# Patient Record
Sex: Female | Born: 1940 | Race: White | Hispanic: No | Marital: Married | Smoking: Former smoker
Health system: Southern US, Community
[De-identification: ages and names within clinical notes are randomized; demographics above are authoritative.]

## PROBLEM LIST (undated history)

## (undated) DIAGNOSIS — I2510 Atherosclerotic heart disease of native coronary artery without angina pectoris: Secondary | ICD-10-CM

## (undated) DIAGNOSIS — B009 Herpesviral infection, unspecified: Secondary | ICD-10-CM

## (undated) DIAGNOSIS — D126 Benign neoplasm of colon, unspecified: Secondary | ICD-10-CM

## (undated) DIAGNOSIS — D649 Anemia, unspecified: Secondary | ICD-10-CM

## (undated) DIAGNOSIS — C50919 Malignant neoplasm of unspecified site of unspecified female breast: Secondary | ICD-10-CM

## (undated) DIAGNOSIS — M4800 Spinal stenosis, site unspecified: Secondary | ICD-10-CM

## (undated) DIAGNOSIS — J189 Pneumonia, unspecified organism: Secondary | ICD-10-CM

## (undated) DIAGNOSIS — E1122 Type 2 diabetes mellitus with diabetic chronic kidney disease: Secondary | ICD-10-CM

## (undated) DIAGNOSIS — I10 Essential (primary) hypertension: Secondary | ICD-10-CM

## (undated) DIAGNOSIS — K219 Gastro-esophageal reflux disease without esophagitis: Secondary | ICD-10-CM

## (undated) DIAGNOSIS — E039 Hypothyroidism, unspecified: Secondary | ICD-10-CM

## (undated) DIAGNOSIS — M069 Rheumatoid arthritis, unspecified: Secondary | ICD-10-CM

## (undated) DIAGNOSIS — Z923 Personal history of irradiation: Secondary | ICD-10-CM

## (undated) DIAGNOSIS — M1990 Unspecified osteoarthritis, unspecified site: Secondary | ICD-10-CM

## (undated) DIAGNOSIS — I771 Stricture of artery: Secondary | ICD-10-CM

## (undated) DIAGNOSIS — T7840XA Allergy, unspecified, initial encounter: Secondary | ICD-10-CM

## (undated) DIAGNOSIS — F419 Anxiety disorder, unspecified: Secondary | ICD-10-CM

## (undated) DIAGNOSIS — F32A Depression, unspecified: Secondary | ICD-10-CM

## (undated) DIAGNOSIS — I739 Peripheral vascular disease, unspecified: Secondary | ICD-10-CM

## (undated) DIAGNOSIS — K759 Inflammatory liver disease, unspecified: Secondary | ICD-10-CM

## (undated) DIAGNOSIS — I350 Nonrheumatic aortic (valve) stenosis: Secondary | ICD-10-CM

## (undated) DIAGNOSIS — Z9221 Personal history of antineoplastic chemotherapy: Secondary | ICD-10-CM

## (undated) DIAGNOSIS — R531 Weakness: Secondary | ICD-10-CM

## (undated) DIAGNOSIS — N184 Chronic kidney disease, stage 4 (severe): Secondary | ICD-10-CM

## (undated) DIAGNOSIS — K5790 Diverticulosis of intestine, part unspecified, without perforation or abscess without bleeding: Secondary | ICD-10-CM

## (undated) DIAGNOSIS — E079 Disorder of thyroid, unspecified: Secondary | ICD-10-CM

## (undated) DIAGNOSIS — E785 Hyperlipidemia, unspecified: Secondary | ICD-10-CM

## (undated) DIAGNOSIS — E7800 Pure hypercholesterolemia, unspecified: Secondary | ICD-10-CM

## (undated) DIAGNOSIS — G479 Sleep disorder, unspecified: Secondary | ICD-10-CM

## (undated) DIAGNOSIS — R011 Cardiac murmur, unspecified: Secondary | ICD-10-CM

## (undated) DIAGNOSIS — C801 Malignant (primary) neoplasm, unspecified: Secondary | ICD-10-CM

## (undated) DIAGNOSIS — F329 Major depressive disorder, single episode, unspecified: Secondary | ICD-10-CM

## (undated) HISTORY — DX: Benign neoplasm of colon, unspecified: D12.6

## (undated) HISTORY — DX: Type 2 diabetes mellitus with diabetic chronic kidney disease: N18.4

## (undated) HISTORY — DX: Diverticulosis of intestine, part unspecified, without perforation or abscess without bleeding: K57.90

## (undated) HISTORY — DX: Gastro-esophageal reflux disease without esophagitis: K21.9

## (undated) HISTORY — PX: CHOLECYSTECTOMY: SHX55

## (undated) HISTORY — DX: Peripheral vascular disease, unspecified: I73.9

## (undated) HISTORY — DX: Malignant neoplasm of unspecified site of unspecified female breast: C50.919

## (undated) HISTORY — DX: Type 2 diabetes mellitus with diabetic chronic kidney disease: E11.22

## (undated) HISTORY — PX: CARPAL TUNNEL RELEASE: SHX101

## (undated) HISTORY — PX: OTHER SURGICAL HISTORY: SHX169

## (undated) HISTORY — PX: BREAST BIOPSY: SHX20

## (undated) HISTORY — DX: Disorder of thyroid, unspecified: E07.9

## (undated) HISTORY — PX: COLONOSCOPY: SHX174

## (undated) HISTORY — DX: Nonrheumatic aortic (valve) stenosis: I35.0

## (undated) HISTORY — PX: DILATION AND CURETTAGE OF UTERUS: SHX78

## (undated) HISTORY — DX: Unspecified osteoarthritis, unspecified site: M19.90

## (undated) HISTORY — DX: Hyperlipidemia, unspecified: E78.5

## (undated) HISTORY — DX: Essential (primary) hypertension: I10

## (undated) HISTORY — DX: Malignant (primary) neoplasm, unspecified: C80.1

## (undated) HISTORY — DX: Pure hypercholesterolemia, unspecified: E78.00

## (undated) HISTORY — DX: Herpesviral infection, unspecified: B00.9

## (undated) HISTORY — PX: UPPER GASTROINTESTINAL ENDOSCOPY: SHX188

## (undated) HISTORY — PX: CORONARY ANGIOPLASTY: SHX604

## (undated) HISTORY — DX: Stricture of artery: I77.1

## (undated) HISTORY — DX: Allergy, unspecified, initial encounter: T78.40XA

## (undated) HISTORY — DX: Anxiety disorder, unspecified: F41.9

---

## 1981-01-22 HISTORY — PX: BREAST LUMPECTOMY: SHX2

## 1997-04-19 ENCOUNTER — Other Ambulatory Visit: Admission: RE | Admit: 1997-04-19 | Discharge: 1997-04-19 | Payer: Self-pay | Admitting: Obstetrics and Gynecology

## 1998-07-14 ENCOUNTER — Encounter: Admission: RE | Admit: 1998-07-14 | Discharge: 1998-08-19 | Payer: Self-pay | Admitting: Family Medicine

## 1998-10-05 ENCOUNTER — Encounter: Admission: RE | Admit: 1998-10-05 | Discharge: 1999-01-03 | Payer: Self-pay | Admitting: Specialist

## 1998-11-28 ENCOUNTER — Encounter: Admission: RE | Admit: 1998-11-28 | Discharge: 1999-02-26 | Payer: Self-pay | Admitting: Anesthesiology

## 1999-04-25 ENCOUNTER — Encounter: Admission: RE | Admit: 1999-04-25 | Discharge: 1999-07-24 | Payer: Self-pay | Admitting: Family Medicine

## 1999-05-01 ENCOUNTER — Encounter: Payer: Self-pay | Admitting: Obstetrics and Gynecology

## 1999-05-01 ENCOUNTER — Encounter: Admission: RE | Admit: 1999-05-01 | Discharge: 1999-05-01 | Payer: Self-pay | Admitting: Obstetrics and Gynecology

## 1999-05-08 ENCOUNTER — Encounter: Payer: Self-pay | Admitting: Obstetrics and Gynecology

## 1999-05-08 ENCOUNTER — Encounter: Admission: RE | Admit: 1999-05-08 | Discharge: 1999-05-08 | Payer: Self-pay | Admitting: Obstetrics and Gynecology

## 1999-11-08 ENCOUNTER — Encounter: Payer: Self-pay | Admitting: Emergency Medicine

## 1999-11-08 ENCOUNTER — Encounter (INDEPENDENT_AMBULATORY_CARE_PROVIDER_SITE_OTHER): Payer: Self-pay | Admitting: *Deleted

## 1999-11-08 ENCOUNTER — Inpatient Hospital Stay (HOSPITAL_COMMUNITY): Admission: EM | Admit: 1999-11-08 | Discharge: 1999-11-10 | Payer: Self-pay | Admitting: Emergency Medicine

## 2000-05-13 ENCOUNTER — Encounter: Admission: RE | Admit: 2000-05-13 | Discharge: 2000-05-13 | Payer: Self-pay | Admitting: Obstetrics and Gynecology

## 2000-05-13 ENCOUNTER — Encounter: Payer: Self-pay | Admitting: Obstetrics and Gynecology

## 2000-07-10 ENCOUNTER — Encounter: Payer: Self-pay | Admitting: Family Medicine

## 2000-07-10 ENCOUNTER — Encounter: Admission: RE | Admit: 2000-07-10 | Discharge: 2000-07-10 | Payer: Self-pay | Admitting: Family Medicine

## 2001-05-16 ENCOUNTER — Encounter: Admission: RE | Admit: 2001-05-16 | Discharge: 2001-05-16 | Payer: Self-pay | Admitting: Obstetrics and Gynecology

## 2001-05-16 ENCOUNTER — Encounter: Payer: Self-pay | Admitting: Obstetrics and Gynecology

## 2002-05-28 ENCOUNTER — Encounter: Admission: RE | Admit: 2002-05-28 | Discharge: 2002-05-28 | Payer: Self-pay | Admitting: Family Medicine

## 2002-05-28 ENCOUNTER — Encounter: Payer: Self-pay | Admitting: Family Medicine

## 2002-05-29 ENCOUNTER — Encounter: Payer: Self-pay | Admitting: Obstetrics and Gynecology

## 2002-05-29 ENCOUNTER — Encounter: Admission: RE | Admit: 2002-05-29 | Discharge: 2002-05-29 | Payer: Self-pay | Admitting: Obstetrics and Gynecology

## 2003-06-04 ENCOUNTER — Ambulatory Visit (HOSPITAL_COMMUNITY): Admission: RE | Admit: 2003-06-04 | Discharge: 2003-06-04 | Payer: Self-pay | Admitting: Obstetrics and Gynecology

## 2003-06-18 ENCOUNTER — Encounter: Admission: RE | Admit: 2003-06-18 | Discharge: 2003-06-18 | Payer: Self-pay | Admitting: Obstetrics and Gynecology

## 2003-12-10 ENCOUNTER — Encounter (INDEPENDENT_AMBULATORY_CARE_PROVIDER_SITE_OTHER): Payer: Self-pay | Admitting: *Deleted

## 2003-12-10 ENCOUNTER — Ambulatory Visit (HOSPITAL_COMMUNITY): Admission: RE | Admit: 2003-12-10 | Discharge: 2003-12-10 | Payer: Self-pay | Admitting: Gastroenterology

## 2003-12-10 DIAGNOSIS — K5790 Diverticulosis of intestine, part unspecified, without perforation or abscess without bleeding: Secondary | ICD-10-CM

## 2003-12-10 DIAGNOSIS — D126 Benign neoplasm of colon, unspecified: Secondary | ICD-10-CM

## 2003-12-10 HISTORY — DX: Benign neoplasm of colon, unspecified: D12.6

## 2003-12-10 HISTORY — DX: Diverticulosis of intestine, part unspecified, without perforation or abscess without bleeding: K57.90

## 2004-06-30 ENCOUNTER — Ambulatory Visit (HOSPITAL_COMMUNITY): Admission: RE | Admit: 2004-06-30 | Discharge: 2004-06-30 | Payer: Self-pay | Admitting: Obstetrics and Gynecology

## 2004-12-25 ENCOUNTER — Ambulatory Visit: Payer: Self-pay | Admitting: Cardiovascular Disease

## 2005-01-03 ENCOUNTER — Ambulatory Visit (HOSPITAL_COMMUNITY): Admission: RE | Admit: 2005-01-03 | Discharge: 2005-01-03 | Payer: Self-pay | Admitting: Cardiovascular Disease

## 2005-01-08 ENCOUNTER — Ambulatory Visit: Payer: Self-pay | Admitting: Cardiovascular Disease

## 2005-01-22 HISTORY — PX: CAROTID STENT: SHX1301

## 2005-01-22 HISTORY — PX: KNEE SURGERY: SHX244

## 2005-01-25 ENCOUNTER — Ambulatory Visit: Payer: Self-pay | Admitting: Cardiovascular Disease

## 2005-01-26 ENCOUNTER — Ambulatory Visit: Payer: Self-pay | Admitting: Cardiovascular Disease

## 2005-01-26 ENCOUNTER — Ambulatory Visit (HOSPITAL_COMMUNITY): Admission: RE | Admit: 2005-01-26 | Discharge: 2005-01-27 | Payer: Self-pay | Admitting: Cardiovascular Disease

## 2005-02-02 ENCOUNTER — Ambulatory Visit: Payer: Self-pay | Admitting: Cardiovascular Disease

## 2005-04-06 ENCOUNTER — Ambulatory Visit: Payer: Self-pay | Admitting: Cardiovascular Disease

## 2005-04-06 ENCOUNTER — Ambulatory Visit: Payer: Self-pay

## 2005-07-06 ENCOUNTER — Ambulatory Visit (HOSPITAL_COMMUNITY): Admission: RE | Admit: 2005-07-06 | Discharge: 2005-07-06 | Payer: Self-pay | Admitting: Obstetrics and Gynecology

## 2005-09-28 ENCOUNTER — Ambulatory Visit: Payer: Self-pay | Admitting: Cardiovascular Disease

## 2006-03-28 ENCOUNTER — Ambulatory Visit: Payer: Self-pay | Admitting: Cardiovascular Disease

## 2006-03-28 LAB — CONVERTED CEMR LAB
BUN: 19 mg/dL (ref 6–23)
Basophils Absolute: 0.1 10*3/uL (ref 0.0–0.1)
Basophils Relative: 1 % (ref 0.0–1.0)
CO2: 26 meq/L (ref 19–32)
Calcium: 9.4 mg/dL (ref 8.4–10.5)
Creatinine, Ser: 1 mg/dL (ref 0.4–1.2)
GFR calc Af Amer: 72 mL/min
GFR calc non Af Amer: 60 mL/min
HCT: 37.8 % (ref 36.0–46.0)
Hemoglobin: 13.1 g/dL (ref 12.0–15.0)
INR: 0.9 (ref 0.9–2.0)
MCV: 91 fL (ref 78.0–100.0)
Neutrophils Relative %: 47.6 % (ref 43.0–77.0)
RDW: 12.3 % (ref 11.5–14.6)
aPTT: 27.3 s (ref 26.5–36.5)

## 2006-04-01 ENCOUNTER — Ambulatory Visit: Payer: Self-pay | Admitting: Cardiovascular Disease

## 2006-04-01 ENCOUNTER — Inpatient Hospital Stay (HOSPITAL_BASED_OUTPATIENT_CLINIC_OR_DEPARTMENT_OTHER): Admission: RE | Admit: 2006-04-01 | Discharge: 2006-04-01 | Payer: Self-pay | Admitting: Cardiovascular Disease

## 2006-04-08 ENCOUNTER — Emergency Department (HOSPITAL_COMMUNITY): Admission: EM | Admit: 2006-04-08 | Discharge: 2006-04-08 | Payer: Self-pay | Admitting: Emergency Medicine

## 2006-04-11 ENCOUNTER — Ambulatory Visit: Payer: Self-pay | Admitting: Cardiovascular Disease

## 2006-05-07 ENCOUNTER — Ambulatory Visit: Payer: Self-pay | Admitting: Internal Medicine

## 2006-05-16 ENCOUNTER — Ambulatory Visit: Payer: Self-pay | Admitting: Internal Medicine

## 2006-05-21 ENCOUNTER — Ambulatory Visit: Payer: Self-pay | Admitting: Internal Medicine

## 2006-05-21 LAB — CONVERTED CEMR LAB
OCCULT 1: NEGATIVE
OCCULT 3: NEGATIVE

## 2006-05-24 ENCOUNTER — Ambulatory Visit: Payer: Self-pay | Admitting: Internal Medicine

## 2006-05-24 ENCOUNTER — Encounter (INDEPENDENT_AMBULATORY_CARE_PROVIDER_SITE_OTHER): Payer: Self-pay | Admitting: Specialist

## 2006-06-04 ENCOUNTER — Encounter: Payer: Self-pay | Admitting: Internal Medicine

## 2006-07-02 ENCOUNTER — Telehealth: Payer: Self-pay | Admitting: Internal Medicine

## 2006-07-02 ENCOUNTER — Telehealth (INDEPENDENT_AMBULATORY_CARE_PROVIDER_SITE_OTHER): Payer: Self-pay | Admitting: *Deleted

## 2006-07-08 ENCOUNTER — Ambulatory Visit (HOSPITAL_COMMUNITY): Admission: RE | Admit: 2006-07-08 | Discharge: 2006-07-08 | Payer: Self-pay | Admitting: Obstetrics and Gynecology

## 2006-07-15 ENCOUNTER — Ambulatory Visit: Payer: Self-pay | Admitting: Cardiovascular Disease

## 2006-09-12 ENCOUNTER — Ambulatory Visit: Payer: Self-pay | Admitting: Internal Medicine

## 2006-09-14 LAB — CONVERTED CEMR LAB: Hgb A1c MFr Bld: 6.4 % — ABNORMAL HIGH (ref 4.6–6.0)

## 2006-09-16 ENCOUNTER — Encounter (INDEPENDENT_AMBULATORY_CARE_PROVIDER_SITE_OTHER): Payer: Self-pay | Admitting: *Deleted

## 2006-11-07 ENCOUNTER — Ambulatory Visit: Payer: Self-pay | Admitting: Internal Medicine

## 2006-11-13 ENCOUNTER — Telehealth (INDEPENDENT_AMBULATORY_CARE_PROVIDER_SITE_OTHER): Payer: Self-pay | Admitting: *Deleted

## 2006-11-17 LAB — CONVERTED CEMR LAB
ALT: 19 units/L (ref 0–35)
BUN: 13 mg/dL (ref 6–23)
Creatinine, Ser: 0.9 mg/dL (ref 0.4–1.2)
Creatinine,U: 97.2 mg/dL
Direct LDL: 171.8 mg/dL
HDL: 40.3 mg/dL (ref >39.0)
Hgb A1c MFr Bld: 6 % (ref 4.6–6.0)
Microalb, Ur: 2.2 mg/dL — ABNORMAL HIGH (ref 0.0–1.9)
Total CHOL/HDL Ratio: 5.9

## 2006-11-19 ENCOUNTER — Encounter (INDEPENDENT_AMBULATORY_CARE_PROVIDER_SITE_OTHER): Payer: Self-pay | Admitting: *Deleted

## 2006-12-03 ENCOUNTER — Ambulatory Visit: Payer: Self-pay | Admitting: Internal Medicine

## 2006-12-03 DIAGNOSIS — R7989 Other specified abnormal findings of blood chemistry: Secondary | ICD-10-CM | POA: Insufficient documentation

## 2006-12-23 ENCOUNTER — Ambulatory Visit: Payer: Self-pay | Admitting: Cardiovascular Disease

## 2007-01-06 ENCOUNTER — Ambulatory Visit: Payer: Self-pay | Admitting: Internal Medicine

## 2007-01-21 ENCOUNTER — Ambulatory Visit: Payer: Self-pay | Admitting: Internal Medicine

## 2007-01-27 ENCOUNTER — Telehealth: Payer: Self-pay | Admitting: Internal Medicine

## 2007-02-05 ENCOUNTER — Encounter: Payer: Self-pay | Admitting: Internal Medicine

## 2007-04-01 ENCOUNTER — Ambulatory Visit: Payer: Self-pay | Admitting: Cardiovascular Disease

## 2007-04-03 ENCOUNTER — Ambulatory Visit: Payer: Self-pay | Admitting: Internal Medicine

## 2007-04-03 ENCOUNTER — Ambulatory Visit: Payer: Self-pay

## 2007-04-03 DIAGNOSIS — E039 Hypothyroidism, unspecified: Secondary | ICD-10-CM | POA: Insufficient documentation

## 2007-04-08 ENCOUNTER — Encounter: Payer: Self-pay | Admitting: Internal Medicine

## 2007-04-14 ENCOUNTER — Encounter (INDEPENDENT_AMBULATORY_CARE_PROVIDER_SITE_OTHER): Payer: Self-pay | Admitting: *Deleted

## 2007-04-17 ENCOUNTER — Ambulatory Visit: Payer: Self-pay | Admitting: Internal Medicine

## 2007-04-17 ENCOUNTER — Encounter (INDEPENDENT_AMBULATORY_CARE_PROVIDER_SITE_OTHER): Payer: Self-pay | Admitting: *Deleted

## 2007-04-17 DIAGNOSIS — I2510 Atherosclerotic heart disease of native coronary artery without angina pectoris: Secondary | ICD-10-CM | POA: Insufficient documentation

## 2007-04-17 DIAGNOSIS — M2550 Pain in unspecified joint: Secondary | ICD-10-CM | POA: Insufficient documentation

## 2007-04-17 DIAGNOSIS — I10 Essential (primary) hypertension: Secondary | ICD-10-CM | POA: Insufficient documentation

## 2007-04-17 DIAGNOSIS — Z9861 Coronary angioplasty status: Secondary | ICD-10-CM

## 2007-04-17 LAB — CONVERTED CEMR LAB
HDL goal, serum: 40 mg/dL
LDL Goal: 100 mg/dL

## 2007-04-20 LAB — CONVERTED CEMR LAB: Sed Rate: 18 mm/hr (ref 0–25)

## 2007-04-21 ENCOUNTER — Encounter (INDEPENDENT_AMBULATORY_CARE_PROVIDER_SITE_OTHER): Payer: Self-pay | Admitting: *Deleted

## 2007-04-29 ENCOUNTER — Telehealth (INDEPENDENT_AMBULATORY_CARE_PROVIDER_SITE_OTHER): Payer: Self-pay | Admitting: *Deleted

## 2007-05-27 ENCOUNTER — Encounter: Admission: RE | Admit: 2007-05-27 | Discharge: 2007-07-22 | Payer: Self-pay | Admitting: Orthopedic Surgery

## 2007-06-02 ENCOUNTER — Ambulatory Visit: Payer: Self-pay | Admitting: Internal Medicine

## 2007-06-11 ENCOUNTER — Telehealth (INDEPENDENT_AMBULATORY_CARE_PROVIDER_SITE_OTHER): Payer: Self-pay | Admitting: *Deleted

## 2007-07-14 ENCOUNTER — Ambulatory Visit: Payer: Self-pay | Admitting: Internal Medicine

## 2007-07-14 LAB — CONVERTED CEMR LAB
AST: 19 units/L (ref 0–37)
Albumin: 3.8 g/dL (ref 3.5–5.2)
BUN: 16 mg/dL (ref 6–23)
Hgb A1c MFr Bld: 5.8 % (ref 4.6–6.0)
Potassium: 4.1 meq/L (ref 3.5–5.1)
Total CHOL/HDL Ratio: 4.1
Total CK: 76 units/L (ref 7–177)
Total Protein: 6.6 g/dL (ref 6.0–8.3)
Triglycerides: 113 mg/dL (ref 0–149)

## 2007-07-21 ENCOUNTER — Ambulatory Visit: Payer: Self-pay | Admitting: Internal Medicine

## 2007-07-21 LAB — CONVERTED CEMR LAB: LDL Goal: 70 mg/dL

## 2007-07-29 ENCOUNTER — Ambulatory Visit (HOSPITAL_COMMUNITY): Admission: RE | Admit: 2007-07-29 | Discharge: 2007-07-29 | Payer: Self-pay | Admitting: Obstetrics and Gynecology

## 2007-09-16 ENCOUNTER — Ambulatory Visit: Payer: Self-pay

## 2007-09-17 ENCOUNTER — Telehealth (INDEPENDENT_AMBULATORY_CARE_PROVIDER_SITE_OTHER): Payer: Self-pay | Admitting: *Deleted

## 2007-10-21 ENCOUNTER — Ambulatory Visit: Payer: Self-pay | Admitting: Internal Medicine

## 2007-11-19 ENCOUNTER — Ambulatory Visit: Payer: Self-pay | Admitting: Internal Medicine

## 2007-11-25 ENCOUNTER — Encounter: Payer: Self-pay | Admitting: Internal Medicine

## 2008-01-27 ENCOUNTER — Ambulatory Visit: Payer: Self-pay | Admitting: Internal Medicine

## 2008-01-27 LAB — CONVERTED CEMR LAB
ALT: 23 units/L (ref 0–35)
AST: 24 units/L (ref 0–37)
Alkaline Phosphatase: 66 units/L (ref 39–117)
BUN: 19 mg/dL (ref 6–23)
Bilirubin, Direct: 0.1 mg/dL (ref 0.0–0.3)
Creatinine, Ser: 1.1 mg/dL (ref 0.4–1.2)
Direct LDL: 131.8 mg/dL
Microalb Creat Ratio: 7.2 mg/g (ref 0.0–30.0)
Microalb, Ur: 0.9 mg/dL (ref 0.0–1.9)
Total Bilirubin: 0.6 mg/dL (ref 0.3–1.2)
Triglycerides: 202 mg/dL (ref 0–149)

## 2008-02-02 ENCOUNTER — Ambulatory Visit: Payer: Self-pay | Admitting: Internal Medicine

## 2008-02-23 ENCOUNTER — Telehealth (INDEPENDENT_AMBULATORY_CARE_PROVIDER_SITE_OTHER): Payer: Self-pay | Admitting: *Deleted

## 2008-02-24 ENCOUNTER — Encounter (INDEPENDENT_AMBULATORY_CARE_PROVIDER_SITE_OTHER): Payer: Self-pay | Admitting: *Deleted

## 2008-03-11 ENCOUNTER — Telehealth: Payer: Self-pay | Admitting: Internal Medicine

## 2008-03-29 ENCOUNTER — Encounter: Payer: Self-pay | Admitting: Internal Medicine

## 2008-05-13 ENCOUNTER — Ambulatory Visit: Payer: Self-pay | Admitting: Internal Medicine

## 2008-05-18 ENCOUNTER — Encounter (INDEPENDENT_AMBULATORY_CARE_PROVIDER_SITE_OTHER): Payer: Self-pay | Admitting: *Deleted

## 2008-05-18 LAB — CONVERTED CEMR LAB
BUN: 18 mg/dL (ref 6–23)
Cholesterol: 158 mg/dL (ref 0–200)
Creatinine,U: 112.9 mg/dL
LDL Cholesterol: 83 mg/dL (ref 0–99)
Microalb Creat Ratio: 8.9 mg/g (ref 0.0–30.0)
Total CHOL/HDL Ratio: 4

## 2008-05-20 ENCOUNTER — Ambulatory Visit: Payer: Self-pay | Admitting: Internal Medicine

## 2008-05-20 DIAGNOSIS — E1129 Type 2 diabetes mellitus with other diabetic kidney complication: Secondary | ICD-10-CM | POA: Insufficient documentation

## 2008-05-20 DIAGNOSIS — Z794 Long term (current) use of insulin: Secondary | ICD-10-CM | POA: Insufficient documentation

## 2008-05-20 DIAGNOSIS — E1165 Type 2 diabetes mellitus with hyperglycemia: Secondary | ICD-10-CM

## 2008-05-20 DIAGNOSIS — E119 Type 2 diabetes mellitus without complications: Secondary | ICD-10-CM | POA: Insufficient documentation

## 2008-05-31 ENCOUNTER — Ambulatory Visit: Payer: Self-pay | Admitting: Family Medicine

## 2008-05-31 DIAGNOSIS — H6500 Acute serous otitis media, unspecified ear: Secondary | ICD-10-CM | POA: Insufficient documentation

## 2008-06-08 ENCOUNTER — Encounter: Payer: Self-pay | Admitting: Internal Medicine

## 2008-09-14 ENCOUNTER — Ambulatory Visit: Payer: Self-pay | Admitting: Cardiovascular Disease

## 2008-09-14 DIAGNOSIS — F32A Depression, unspecified: Secondary | ICD-10-CM | POA: Insufficient documentation

## 2008-09-14 DIAGNOSIS — K219 Gastro-esophageal reflux disease without esophagitis: Secondary | ICD-10-CM | POA: Insufficient documentation

## 2008-09-14 DIAGNOSIS — E7800 Pure hypercholesterolemia, unspecified: Secondary | ICD-10-CM | POA: Insufficient documentation

## 2008-09-14 DIAGNOSIS — F329 Major depressive disorder, single episode, unspecified: Secondary | ICD-10-CM | POA: Insufficient documentation

## 2008-09-20 ENCOUNTER — Telehealth (INDEPENDENT_AMBULATORY_CARE_PROVIDER_SITE_OTHER): Payer: Self-pay

## 2008-09-21 ENCOUNTER — Encounter: Payer: Self-pay | Admitting: Cardiovascular Disease

## 2008-09-21 ENCOUNTER — Ambulatory Visit: Payer: Self-pay

## 2008-09-29 ENCOUNTER — Encounter: Payer: Self-pay | Admitting: Internal Medicine

## 2008-11-25 ENCOUNTER — Telehealth (INDEPENDENT_AMBULATORY_CARE_PROVIDER_SITE_OTHER): Payer: Self-pay | Admitting: *Deleted

## 2008-12-08 ENCOUNTER — Encounter: Payer: Self-pay | Admitting: Internal Medicine

## 2009-01-03 ENCOUNTER — Telehealth (INDEPENDENT_AMBULATORY_CARE_PROVIDER_SITE_OTHER): Payer: Self-pay | Admitting: *Deleted

## 2009-05-18 ENCOUNTER — Telehealth (INDEPENDENT_AMBULATORY_CARE_PROVIDER_SITE_OTHER): Payer: Self-pay | Admitting: *Deleted

## 2010-02-12 ENCOUNTER — Encounter: Payer: Self-pay | Admitting: Gastroenterology

## 2010-02-19 LAB — CONVERTED CEMR LAB
Total CK: 73 units/L (ref 7–177)
Total Protein: 7.1 g/dL (ref 6.0–8.3)

## 2010-02-23 NOTE — Progress Notes (Signed)
Summary: Refill Request to be forwarded  Phone Note Outgoing Call Call back at [facility] Phone [phone]   Call placed by: Georgette Dover,  May 18, 2009 11:45 AM Call placed to: Patient Summary of Call: Left message on machine informing patient that I will forward her refill request for generic Amaryl to her Endo/Diabetes Dr., Dr.Kumar. If patient has any question reguarding this she can call our office./[facility]  May 18, 2009 11:48 AM

## 2010-05-17 ENCOUNTER — Telehealth: Payer: Self-pay | Admitting: Cardiovascular Disease

## 2010-05-17 NOTE — Telephone Encounter (Signed)
Pt calling re taking some med given to her for a sprained hip-what anti inflammatory med can she take?

## 2010-05-17 NOTE — Telephone Encounter (Signed)
Spoke with pt, she was seen by an ortho, she has a strained hip. He wanted to put the pt on a antiinflammatory but was concerned because of her stent. He wanted to know if dr Johnsie Cancel thought it was okay for the pt to take aleve for several days in a row. Will forward for dr Johnsie Cancel review [name]

## 2010-05-18 NOTE — Telephone Encounter (Signed)
Ok to use antiinflamatory just not Cox 2 inhibitor like Celebrex

## 2010-05-19 NOTE — Telephone Encounter (Signed)
Spoke with pt, she is aware of dr Kyla Balzarine recommendations. Fredia Beets

## 2010-06-06 NOTE — Assessment & Plan Note (Signed)
[place] OFFICE NOTE   NAME:Bachtell, RONDELL ZERVAS                      MRN:          [id number]  DATE:07/15/2006                            DOB:          Mar 02, 1940    Kirston returns today for followup. She has had a previous Taxus stent in  January of 2007 to the distal right. She had been having recurrent chest  pain. She was catheterized in March of 2008. She got sick during the  catheterization. I was not sure why. She had chest pain, complete heart  block. She may have had spasm. Her stent was widely patent. We thought  that her pain may be from esophageal reflux. She was placed on Protonix.   Overall, Timeca is doing well. She is not having any recurrent chest  pain. She was a bit depressed this morning. Her mother died recently.  She was 88 years old and was tired, and Koreen said it was time.   She has been compliant with her medications. She has been walking on a  regular basis.   Her review of systems is remarkable for some lower extremity edema.  Otherwise negative.   Her medications include:  1. Lisinopril 40 a day.  2. Paxil 20 a day.  3. Niaspan 1 gram a day.  4. Naprosyn p.r.n.  5. Actos 30 a day.  6. Welchol.  7. Protonix 40 a day.   Her exam is remarkable for a healthy-appearing, middle-aged, white  female in no distress. Mood is somewhat flat due to her mother's recent  death. Weight is 163, blood pressure is 150/80, pulse is 81 and regular.  HEENT:  Is normal. Carotids normal without bruit. There is no  lymphadenopathy. No thyromegaly. No JVP elevation.  LUNGS:  Are clear with normal diaphragmatic motion and no wheezing.  Respiratory rate is 12. She is afebrile.  S1/S2 with normal heart sounds. PMI is normal.  ABDOMEN:  Benign. There is no tenderness. Bowel sounds are positive. No  hepatosplenomegaly or hepatojugular reflux. No AAA.  Femorals are +3 bilaterally without bruit. PTs were +3.  There was no  lower extremity edema.  NEUROLOGICAL:  Nonfocal. There is no muscular weakness.   IMPRESSION:  1. Stable coronary disease. Patent stent to the right coronary artery.      Continue aspirin and Plavix.  2. Blood pressure borderline elevated. Add back beta blocker of Coreg      3.125 b.i.d. Continue low-salt diet and lisinopril.  3. Lower extremity edema. P.r.n. hydrochlorothiazide given. The      patient will asked Dr. Elyse Hsu about going back on glipizide as      opposed to Actos in regards to her heart and fluid retention.  4. Hypercholesterolemia. I will have to review the chart. Apparently,      intolerant to statins. Continue Niaspan and Welchol. Followup lipid      and liver profile in 6 months.  5. History of reflux. Continue H2 blocker. Follow up with primary M.D.      to see if endoscopy is needed.  6. Recent death  of mother, history of depression. Continue Paxil 20 a      day.   I will see her back in 6 months.     Wallis Bamberg. Johnsie Cancel, MD, [facility]  Electronically Signed    PCN/MedQ  DD: 07/15/2006  DT: 07/15/2006  Job #: [id number]

## 2010-06-06 NOTE — Assessment & Plan Note (Signed)
[facility] HEALTHCARE                            CARDIOLOGY OFFICE NOTE   NAME:[name], [name]                      MRN:          [id number]  DATE:12/23/2006                            DOB:          Jun 22, 1940    [name] returns today for followup.   She has had previous angioplasty and stenting in the right coronary  artery I believe in 2004.  Last Myoview in March 2007 was nonischemic  and when I last saw her blood pressure had been up a little bit, and we  added Coreg.  She has done well with this.  I was also concerned about  Actos causing lower extremity edema.  Since I last saw her she switched  doctors and sees Dr. Linna Darner now.  He has stopped all of her medications  and is going to do some lab work in a month or so.  I am not sure if he  plans on doing a glucose tolerance test.  However, off the Actos her  lower extremity edema is better.   She has been trying to watch her salt intake.  She is doing better in  regards to her mother passing last year.  She seems to have gotten over  this and has been maintained on 10 of Paxil.   She did have a good Thanksgiving with her family and seems to be in  brighter spirits.   REVIEW OF SYSTEMS:  Otherwise, negative.   CURRENT MEDICATIONS:  1. Lisinopril 40 a day.  2. Paxil 20 a day.  3. Plavix 75 a day.  4. Protonix 40 a day.  5. HCTZ 12.5 p.r.n.  6. Coreg 125 b.i.d.  She uses the [facility] on [address]      in [place].   PHYSICAL EXAMINATION:  GENERAL:  Remarkable for healthy-appearing middle-  aged white female in no distress.  Affect is appropriate and improved.  VITAL SIGNS:  Weight 161, blood pressure 140/80, pulse 77 and regular,  afebrile, respiratory rate 14.  HEENT:  Unremarkable.  Carotids normal without bruit.  NECK:  No lymphadenopathy, thyromegaly or JVP elevation.  LUNGS:  Clear, good diaphragmatic motion.  No wheezing.  HEART:  S1, S2 with normal heart sounds.  PMI is  normal.  ABDOMEN:  Benign.  Bowel sounds are positive with no tenderness, no  hepatosplenomegaly, no hepatojugular reflux.  EXTREMITIES:  Distal pulses are intact with no edema.  NEUROLOGICAL:  Nonfocal, no muscular weakness.  SKIN:  Warm and dry.   Based on EKG is essentially normal with J point elevation in the  inferior leads and then significant Q's in II, III, and aVF.   IMPRESSION:  1. History of right coronary stent in 2004.  Currently not having      angina.  Consider followup Myoview in March followup.  Continue      aspirin and beta blocker therapy.  2. Hypertension currently better controlled with the addition of beta      blocker.  Continue lisinopril, low salt diet.  3. Lower extremity edema probably secondary to dependency and Actos,  improved.  She has p.r.n. hydrochlorothiazide to take if she needs      it.  4. Question of diabetes.  Follow up with Dr. Linna Darner.  Probably check      hemoglobin A1c and glucose tolerance test.  5. Depression, improved.  Continue Paxil 20 mg a day reactive.  Seems      to be getting along much better now that her mom has passed away      more than a year ago.  Family support seemed excellent.  6. History of reflux.  Continue Protonix 40 a day.  Low spice diet.      Avoid late night meals.   Overall [name] is doing good, and I suspect we will order a Myoview in  March.     Wallis Bamberg. Johnsie Cancel, MD, [facility]  Electronically Signed    PCN/MedQ  DD: 12/23/2006  DT: 12/23/2006  Job #: [id number]

## 2010-06-06 NOTE — Assessment & Plan Note (Signed)
[place] OFFICE NOTE   NAME:[name] [name] WOODROME                      MRN:          [id number]  DATE:09/16/2007                            DOB:          09/12/1940    [name] [name] returns today for followup.  She has a history of previous stent  to the distal right coronary artery.   She recently had a Myoview in March 2009, to clear her for left  orthopedic rotator cuff surgery in her shoulder.  This was nonischemic.  She has been doing well.  She has not had any recurrent chest pain.   Her stent was widely patent by cath in March 2008.   She did appear to have either spasm or dye reaction during her last  cath, but I did not think this was clinically relevant.   She has been compliant with her medications, she sees Dr. Linna Darner.  I do  not have her most recent cholesterol level.   REVIEW OF SYSTEMS:  Remarkable for what sounds like some carpal tunnel  type syndrome, particularly pneumonia when she wakes up.  I suspect she  is sleeping with bent wrists, otherwise, negative.   MEDICATIONS:  1. She is on lisinopril 40.  2. Paxil 20.  3. Protonix 40.  4. Plavix 75 a day.  5. Synthroid 50 mcg a day.  6. Aspirin a day.  7. Crestor 5 a day.  8. Metformin 500 b.i.d.  9. Coreg 12.5 b.i.d.  10.Glimepiride 1 mg a day.  11.She has p.r.n. hydrochlorothiazide.   PHYSICAL EXAMINATION:  VITAL SIGNS:  Remarkable for blood pressure  150/70, pulse 70 regular, respiratory 14, and afebrile.  Weight 160.  HEENT:  Unremarkable.  NECK:  Carotids normal without bruit.  No lymphadenopathy, thyromegaly,  or JVP elevation.  LUNGS:  Clear with good diaphragmatic motion.  No wheezing.  S1 and S2  with a short systolic murmur.  PMI normal.  ABDOMEN:  Benign.  Bowel  sounds positive.  No AAA, no tenderness, no bruit, no  hepatosplenomegaly, and hepatojugular reflexes.  EXTREMITIES:  Distal pulses are intact.  No edema.  NEUROLOGIC:   Nonfocal.  SKIN:  Warm and dry.  No muscular weakness.   EKG is totally normal.   IMPRESSION:  1. Coronary artery disease, stent to the distal right, continue      aspirin and Plavix.  Nonischemic Myoview in March.  Follow up in a      year.  2. Hypertension, currently well controlled.  Continue current dose of      ACE inhibitor and low-salt diet.  3. Hypercholesterolemia.  Follow up with Dr. Linna Darner.  Continue Crestor      5 mg a day.  Lipid and liver profile in 6 months.  4. Hypothyroidism.  Check TSH per Dr. Linna Darner.  Continue low-dose      Synthroid.  5. Diabetes.  Hemoglobin A1c quarterly seems to be following a      reasonable low-carbohydrate, low-sugar diet.  Continue oral      hypoglycemics.   Overall I think [name] [name] is doing well.  I will see her back in a year.     Wallis Bamberg. Johnsie Cancel, MD, [facility]  Electronically Signed    PCN/MedQ  DD: 09/16/2007  DT: 09/17/2007  Job #: [id number]

## 2010-06-06 NOTE — Assessment & Plan Note (Signed)
[place] OFFICE NOTE   NAME:Wimmer, CHENELL DULONG                      MRN:          [id number]  DATE:04/01/2007                            DOB:          01/23/1940    [name] returns today for followup.  She has known coronary artery  disease. Last cath was in March 2008. She had some spasm in her LAD with  injections. The patient had a stent to the distal right coronary artery  which was widely patent.  She has good LV function.   She needs to have left shoulder surgery with Metta Clines. Supple, M.D. for  bone spurs. She had a cortisone shot in it about 2 weeks ago with some  relief, but it is recurred.  I told her she should probably have a  Myoview study given the significant chest pain she had with her heart  cath.  There is a question of spasm.   The patient has not been on beta blockers both for this reason and the  fact that she seemed to have some side effects with Coreg.  She was on  Coreg previously and had quite low blood pressures at home with  symptomatic pain in her chest and dizziness.   She has also been intolerant to just about every statin that she has  been tried on. She has severe myalgias.  She had hot flashes and burning  in her arms with Niaspan. Her LDL cholesterol has been 170.  She is to  see Dr. Linna Darner to have her TSH and cholesterol work done again in the  next week or so. It may be that the only drug that she can tolerate is  fibrates.   Otherwise, she is doing well.  She is not having angina.  She is active.  She is not smoking.   In regards to her preop risk, it is otherwise low. Her review of systems  is otherwise negative.   CURRENT MEDICATIONS:  1. Lisinopril 40 a day.  2. Paxil 20 a day.  3. Protonix 40 a day.  4. Plavix 75 a day.  5. Synthroid 50 mcg a day.  6. Aspirin a day.   PHYSICAL EXAMINATION:  Is remarkable for a middle-aged white female in  no distress.  Her weight  is 160, blood pressure is 150/80, pulse 80 and  regular, afebrile, respiratory rate 14.  HEENT:  Unremarkable.  Carotids are normal without bruit. No  lymphadenopathy, thyromegaly or JVP elevation.  LUNGS:  Clear with good diaphragmatic motion.  No wheezing.  S1-S2 with normal heart sounds. PMI normal.  ABDOMEN:  Benign.  Bowel sounds positive.  No AAA. No  hepatosplenomegaly. No hepatojugular reflux. No bruits or tenderness.  Distal pulses are intact. No edema.  NEURO: Nonfocal.  SKIN:  Warm and dry.  No muscular weakness.   Baseline EKG is normal.   IMPRESSION:  1. Pre-op clearance. She will have a stress Myoview.  So long as this      is nonischemic she will be cleared for surgery.  2. Hypertension.  She has  a white coat component.  She clearly had low      blood pressures while at home. She takes her blood pressure home at      least twice a week and they have been in good range.  Continue      lisinopril at 40 mg a day for the time being. No beta blocker given      her problems with Coreg.  3. Current upper respiratory infection.  She does have a little bit of      exploratory wheezing on exam. She has had a upper respiratory      infection without fever.  She will follow with Dr. Linna Darner. No      indication for a Z-Pak at this time.  4. Hypothyroidism.  Continue Synthroid 50 mcg a day.  TSH and T4 to be      checked with cholesterol.  5. Hypercholesterolemia. INTOLERANT TO STATINS AND NIASPAN.  Recheck      cholesterol. Continue low-fat diet.  Consider adding fibrates.  6. History of depression.  Continue Paxil 20 a day.  Mood seems fine.  7. Left shoulder bone spur. Follow-up with Dr. Onnie Graham.  Probable      arthroscopic surgery as an outpatient once cleared by stress test.     Wallis Bamberg. Johnsie Cancel, MD, [facility]  Electronically Signed    PCN/MedQ  DD: 04/01/2007  DT: 04/01/2007  Job #: [id number]

## 2010-06-09 NOTE — Assessment & Plan Note (Signed)
[facility]                              CARDIOLOGY OFFICE NOTE   NAME:[name], [name] HIBBERT                      MRN:          [id number]  DATE:09/28/2005                            DOB:          01/18/1941    [name] returns today for followup.  She is status post Taxus stenting of the  distal right coronary artery.  I have told her she would be on Plavix  indefinitely.   She is a low grade diabetic.  I think her last hemoglobin A1C was 5.9.  She  is on Avandia, and I asked her to talk to Dr. Thea Silversmith about switching her  over to Amaryl.  From a cardiac standpoint, she is doing well.  She denies  significant chest pain.  She had some arthroscopic knee surgery that was  uneventful.   PHYSICAL EXAMINATION:  GENERAL:  She looks well.  VITAL SIGNS:  Blood pressure 120/70, pulse 70 and regular.  LUNGS:  Clear.  NECK:  Carotids are normal.  HEART:  There is an S1 and S2, with a soft systolic murmur.  ABDOMEN:  Benign.  EXTREMITIES:  Lower extremities with intact pulses.  No edema.   IMPRESSION:  Stable, status post Taxus stenting of this right coronary  artery.  No angina.   Continue current medical therapy.  She has nitroglycerin that is less than a  year old.  She will talk to Dr. Thea Silversmith about changing her oral  hypoglycemic.  I will see her back in six months.                               Wallis Bamberg. Johnsie Cancel, MD, [facility]    PCN/MedQ  DD:  09/28/2005  DT:  09/28/2005  Job #:  [id number]

## 2010-06-09 NOTE — Assessment & Plan Note (Signed)
[place] OFFICE NOTE   NAME:[name], [name]                      MRN:          [id number]  DATE:04/11/2006                            DOB:          [birth year]    [name] returns today for followup.  She has had a previous TAXUS stent in  January of 2007 to the distal right.  She had recurring chest pains and  a catheterization earlier this week.  She had a tough time with her  heart cath, having either coronary spasm with ST elevation chest pain as  well as short episode of heart block requiring temporary pacemaker.  However, her stent was widely patent.  She had no left-sided disease.  Her filling pressures were normal.  There was no pulmonary hypertension.  During the case, we gave her some prednisone.  She had difficulty  sleeping 2 days after the cath and her blood sugars were a little high.  She also had a bout of gastroenteritis and diarrhea, and has been having  low blood pressures.  She continues to get central chest pain.  It also  hurts when she swallows.  I suspect that part of her problem is  esophageal in nature.  I will try to get her in to see Dr. Ardis Hughs or Dr.  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   REVIEW OF SYSTEMS:  Otherwise, negative.  She has not had any fevers.  Her diarrhea is improved.   1. She has currently been holding her Coreg due to her low blood      pressure from her diarrhea.  2. She is on lisinopril 40 mg a day.  3. Paxil 20 a day.  4. An aspirin a day.  5. Plavix 75 a day.  6. Glipizide 5 a day.   EXAM:  Blood pressure is 128/70, pulse is 65 and regular.  HEENT:  Normal.  Carotids normal without bruit.  LUNGS:  Clear.  There is an S1, S2 with normal heart sounds.  ABDOMEN:  Benign.  Cath site is well-healed.  Distal pulses are intact.  No edema.   IMPRESSION:  Coronary artery disease, chest pain likely related to  esophageal spasm with reflux.  Continue Protonix.  Followup with  gastroenterology department for possible EGD or monometry.  We will see  her back in about 3 months.  I prefer to keep her on her Plavix even  though her stent was about 14 months ago.   She will get back on her Coreg as her blood pressure rises after her  diarrheal illness.   She will continue her Zetia for hyperlipidemia.     Wallis Bamberg. Johnsie Cancel, MD, [facility]  Electronically Signed    PCN/MedQ  DD: 04/11/2006  DT: 04/11/2006  Job #: [id number]

## 2010-06-09 NOTE — Cardiovascular Report (Signed)
[name], [name]               ACCOUNT NO.:  [id number]   MEDICAL RECORD NO.:  [id number]          PATIENT TYPE:  OIB   LOCATION:  1962                         FACILITY:  [facility]   PHYSICIAN:  Peter C. Johnsie Cancel, MD, FACCDATE OF BIRTH:  06-10-1940   DATE OF PROCEDURE:  04/01/2006  DATE OF DISCHARGE:                            CARDIAC CATHETERIZATION   [name] is a 65-year patient with recurrent chest pain.  She has had  previous stenting of the distal right coronary artery.  She has also  been having increasing shortness of breath.  A right and left heart cath  was done to assess coronary anatomy and pulmonary pressures.   Cine catheterization was done with a 4-French right femoral artery  sheath and a 7-French venous sheath.   After the initial 3 or 4 injections of the left coronary system, the  patient had significant chest pain with ST elevation on her monitor.  She was treated with normal saline fluid, 0.25 mg of atropine, and a  temporary pacemaker was inserted.  The entire episode lasted about 10  minutes.   She seemed to recover.  As her ST elevation resolved, we gave the  patient IC nitroglycerin.  Repeat injections of the left coronary system  showed no significant changes.  We were not sure if the patient had a  mild dye reaction verses coronary spasm with injection.   The left main coronary artery was normal.   The left anterior descending artery had 20-30% multiple discrete lesions  in the proximal mid vessel.  The patient had a medium-sized first  diagonal branch which was normal.  The second diagonal branch was  actually the apex vessel that was quite a large vessel.  The distal LAD  was small and diffusely diseased.  It may have been the culprit in  regards to possible spasm with injections.   There were no fixed stenoses in the distal LAD.   The circumflex coronary artery was small.  There was a 30% tubular  lesion in the ostium.  There was a single AV  groove and a single OM  branch which were normal.   The right coronary artery was widely patent.  There was a stent in the  distal vessel which was normal with no restenosis.   Ventriculography was not performed due to the concern about the  patient's chest pain starting with dye injection.   The patient's right heart cath showed the following:  Mean right atrial  pressure was 5, RV pressure was 30/7, PA pressure was 30/18, mean  pulmonary capillary wedge pressure was 12, LV pressure was 100/8, aortic  pressure was 100/60.  Cardiac output was 2.6 liters per minute.   When the patient became bradycardic and hypotensive after the initial  left-sided injections, a temporary pacemaker was placed through the  venous sheath.  This was done with fluoroscopic guidance with good  capture.  The patient was temporarily paced for approximately 3-4  minutes.   At the end of the case, the patient was pain free and stable with good  hemodynamics.  The heart rate  was 83 in sinus rhythm, and the blood  pressure is 122/77.   IMPRESSION:  I am not sure what to make of the patient's clinical  syndrome.  She may be having spasm.   We will continue to treat her medically.  The stent in the distal right  coronary artery is widely patent, and there is no fixed disease in the  left system.  In regards to her shortness of breath, there is no  significant pulmonary hypertension and her wedge pressure is low.   Despite the patient's possible spasm or dye reaction, she was stable at  the end of the case and will most likely be discharged later today.      Wallis Bamberg. Johnsie Cancel, MD, [facility]  Electronically Signed     PCN/MEDQ  D:  04/01/2006  T:  04/01/2006  Job:  [id number]

## 2010-06-09 NOTE — Assessment & Plan Note (Signed)
[facility] OFFICE NOTE   NAME:Lentz, CLEO STEG                      MRN:          [id number]  DATE:05/16/2006                            DOB:          09/03/1940    Ms. Janota is a very nice 70 year old white female who is here today  for evaluation of substernal discomfort which after cardiology  evaluation is attributed either to esophageal or gastric problems.  Ms.  Schwickerath has coronary artery disease and underwent cardiac  catheterization and placement of a stent by Dr. Johnsie Cancel within the last  year.  She is on Plavix and is doing well.  She had repeat  catheterization after presenting with recurrent chest pain within the  last month, with the conclusion that the stent was open.  She woke up  about four weeks ago with indigestion which became worse during the day,  and she ended up at [facility] emergency room on April 08, 2006 with  substernal chest pain which was evaluated, but she was sent home on  Protonix 40 mg a day.  The chest pain went away.  The patient  discontinued Protonix, and there was a recurrence of the pain last week,  with burning substernal in the midline.  She was started again on  Protonix 40 mg, this time twice a day, with improvement of the heartburn  but she still has an uncomfortable feeling, almost like odynophagia when  she eats and also some dysphagia.  She was at the time on naproxen 500  mg on a p.r.n. basis and aspirin 81 mg a day.  She has been seeing Dr.  Elyse Hsu for hyperlipidemia, increased TSH, and diabetes.   MEDICATIONS:  1. Lisinopril 140 mg p.o. daily.  2. Paxil 20 mg p.o. daily.  3. Fish oil calcium supplements.  4. Plavix 75 mg p.o. daily.  5. Glipizide 5 mg p.o. daily.  6. Flaxseed.  7. Niaspan 1000 mg daily.  8. Naproxen 500 mg p.r.n.  It has been discontinued.  9. Actos 30 mg p.o. daily.  10.Welchol 625 mg, six a day.  11.Protonix 40 mg b.i.d.   PAST  MEDICAL HISTORY:  1. Coronary artery disease, coronary artery stent in January of 2007.  2. High blood pressure.  3. Diabetes.  4. Hyperlipidemia.   PAST SURGICAL HISTORY:  She had a laparoscopic cholecystectomy five  years ago.   FAMILY HISTORY:  Negative for colon cancer.  Heart disease in mother,  father, brother.  Alcoholism in father.   SOCIAL HISTORY:  Married with three children.  She works as a  Radiation protection practitioner, not currently working.  The patient does not smoke.  She  drinks alcohol socially.   REVIEW OF SYSTEMS:  Positive for night sweats, arthritic complaints,  sleeping problems.   PHYSICAL EXAMINATION:  VITAL SIGNS:  Blood pressure 118/62, pulse 100,  weight 159 pounds.  GENERAL:  She was alert and oriented and in no distress.  HEENT:  Sclerae was nonicteric.  NECK:  Supple, no adenopathy.  LUNGS:  Clear to auscultation.  CARDIAC:  Normal S1 and S2.  ABDOMEN:  Soft with  minimal tenderness in the midline in the subxiphoid  area and slightly above the umbilicus.  It did not seem to extend to the  left or right upper quadrants which appeared to be normal.  The liver  edge was at the costal margin.  There were post-laparoscopic  cholecystectomy scars in the right upper quadrant.  The lower abdomen  was unremarkable.  RECTAL:  Normal external tone with Hemoccult-negative stool.   The patient apparently had a colonoscopy within the last five years by  Dr. Collene Mares.  She had some polyps.  We do not have the records, but we will  try to obtain it.   IMPRESSION:  61. 70 year old white female with substernal chest pain, no cardiac,      partially relieved by proton pump inhibitors with some persistent      odynphagia, dysphagia.  Rule out Candida esophagitis.  Rule out      nonsteroidal anti-inflammatory drug-induced gastropathy.  Rule out      hiatal hernia.  Rule out Barrett's esophagus with mild stricture.      Rule out Helicobacter pylori gastropathy.  2. Coronary artery  disease status post percutaneous coronary      intervention in 2007.  The patient currently on Plavix for at least      a year.  3. Status post colonoscopy five years ago with findings of polyps.      Records pending.   PLAN:  1. We will proceed with a diagnostic upper endoscopy, possible      biopsies, possibly esophageal dilation in an attempt to evaluate      her chest pain.  2. Continue Protonix 40 mg p.o. b.i.d. for now.  3. Add Carafate 1 g p.o. b.i.d.  4. Depending on the upper endoscopy, she may need further evaluation      such as gastric emptying scan or repeat upper abdominal ultrasound,      although she had previous cholecystectomy.     Lowella Bandy. Olevia Perches, MD  Electronically Signed    DMB/MedQ  DD: 05/16/2006  DT: 05/16/2006  Job #: [id number]   cc:   Gwenlyn Perking, M.D.  Wallis Bamberg. Johnsie Cancel, MD, [facility]

## 2010-06-09 NOTE — Assessment & Plan Note (Signed)
[facility] OFFICE NOTE   NAME:Freels, JANNE MCMELLON                      MRN:          [id number]  DATE:03/28/2006                            DOB:          1940-10-15    Jionni is seen today in followup.  There were multiple issues to discuss  today.   Ryan has had previous Taxus stenting in January 2007 of the distal  right coronary artery.  She had had a previous cardiac CT with question  of significant disease in her LAD and right and a calcium score of  greater than 700.  She subsequently underwent stenting and Rotablator  therapy of the distal right coronary artery by Dr. Lia Foyer.   She has been on aspirin and Plavix since then.  She was concerned about  the recent reports of Zetia.   Her LDL cholesterol history been 131; we talked at length about this.  I  explained to her that my own bias was that Zetia worked in the bowels  and it was not a dangerous drug, particularly in patients that have LDL  cholesterols that high; however, she prefers to stop the Zetia.   We also talked at length about issues of how long she should be on  Plavix.  She has a drug-eluting stent.  I explained to her that these  stents tend to do worse in the first 6 months.   However, given her diabetes and risk factors, I told her we would  probably have her on Plavix indefinitely.   Unfortunately, Carmyn also has some new symptoms; she is having  increasing shortness of breath, chest pain and increasing blood  pressures at home.  She is not under any particular undue stress.   Her chest pain is worrisome and she is getting it 2-3 times per day.  She has not taken nitroglycerin for it, but it can be exertional on  occasion.   She has not had any prolonged episodes of resting pain.  After telling  me about these symptoms, I told Yahaira that I would like to start her on  Coreg 12.5 mg b.i.d.  She has coronary disease and is a diabetic  and her  blood pressure is high.  I also explained to her that I thought that  repeat heart cath was indicated.   The risks including stroke, need for repeat intervention or surgery,  bleeding diathesis or dye reaction were discussed; she is willing to  proceed.   REVIEW OF SYSTEMS:  Otherwise significant for no lower extremity edema.  She has had some shortness of breath.  She is a previous long-time  smoker and quit a few years ago.  She is not on an inhaler and does not  have previous PFTs that I can see.   ALLERGIES:  She is allergic to CODEINE and MYCINS.   CURRENT MEDICATIONS:  1. Lisinopril 40 mg a day.  2. Paxil 20 mg a day.  3. Baby aspirin a day.  4. Plavix 75 mg a day.  5. Glipizide 5 mg a day.  6. Flaxseed oil.  7. Fish oil.   She has stopped her Zetia and I will have to review my chart and see why  she is not a statin drug.   PHYSICAL EXAMINATION:  Blood pressure of 168/88.  Pulse is 90 and  regular.  HEENT:  Normal.  LUNGS:  End-expiratory wheezes on the right.  Carotids are without bruit.  JVP is normal.  HEENT:  Otherwise unremarkable.  There is an S1 and S2 with normal heart sounds.  ABDOMEN:  Benign.  LOWER EXTREMITIES:  Intact pulses.  No edema.   EKG:  Normal.   IMPRESSION:  Multiple issues to discuss with Sahira.  She has increasing  hypertension and tachycardia.  She will be started on Coreg 12.5 mg  b.i.d.  She has been having chest pain and has a history of distal right  Rotablator and stenting and needs repeat heart catheterization.  She has  been having dyspnea and is a long-time smoker with some wheezing in her  right lung and we will check her chest x-ray today and she will have a  right heart catheterization on Monday with her diagnostic angiography.   The patient does not want to be on Zetia.  She is currently only on fish  oil and flaxseed oil.  We will review the records and see if we can  start her on a statin after her procedure.    Given her coronary disease, she will remain on aspirin and Plavix.   Again, we spent quite a bit of time with Jordyne today.  All of her  questions regarding her medication changes, the Zetia, her symptoms and  need for diagnostic heart catheterization were answered.  Further  recommendations will be based on these results on Monday.     Wallis Bamberg. Johnsie Cancel, MD, [facility]  Electronically Signed    PCN/MedQ  DD: 03/28/2006  DT: 03/28/2006  Job #: [id number]

## 2010-06-09 NOTE — Assessment & Plan Note (Signed)
[facility] OFFICE NOTE   NAME:[name], [name]                      MRN:          [id number]  DATE:05/07/2006                            DOB:          Apr 15, 1940    [name] was seen as a new patient May 07, 2006  to evaluate  heartburn.  She had the onset of her symptoms approximately 1 week  after having a catheterization March 31, 2005 by Dr. Johnsie Cancel; the cath  was negative by her report.  She was seen in the emergency room on March  17 and diagnosed as having hiatal hernia.  She was given 10 days of  Protonix with benefit.   She has had exacerbations of her symptoms with heartburn for 4 days,  which she describes as constant, day and night.  She describes  belching 15 to 20 times per day.  She denies dysphagia but states that  she almost will note food sticking.  Her weight has been stable and  she denies rectal bleeding or melena.  Constitutional symptoms are also  negative.   The past history includes cath and stenting in January 2007.  She has  had cholecystectomy.  She has had lumpectomy in the right breast and  arthroscopy of the knee.   FAMILY HISTORY:  Includes ulcer in her mother and myocardial infarction  in her mother, father and brother.  In addition, when she ran out of the  Protonix she has been taking Tums,several times a day.   She does have dyslipidemia and diabetes, followed by Dr. Legrand Como  [place].  Her hemoglobin A1c was 6.0% within the past month.  He has  initiated Welchol and changed her to Actos.   She is also on:  1. Lisinopril 40 mg daily.  2. Paroxetine 20 mg daily.  3. Plavix 75 mg daily.  4. Niacin 1000 mg daily.  5. Non-coated 81 mg aspirin daily.  6. Omega-3 fatty acids 1200 mg 3 daily.  7. Calcium with vitamin D.  8. An occasional naproxen.   She denies excess stress, although she does care for 4 grandsons.   She will drink 8 to 10 ounces of wine per  day, has 1-1/2 cups of coffee  a day and an occasional chocolate.   PAST HISTORY:  Included colonoscopy in 2006 by Dr. Collene Mares, which revealed  small polyps which were removed.   She is 5 foot 3 and weighs 159.6.  Pulse is 82, respiratory rate 14,  blood pressure 140/80.  Thyroid is normal to palpation.  She has no lymphadenopathy .  She has no icterus or jaundice.  There is a grade I systolic murmur across the precordium.  CHEST:  Was clear with no increased work of breathing.  ABDOMEN:  Is nontender with normal bowel sounds, no masses.  Pedal pulses are present but slightly decreased.   Her symptoms are of recent onset, but certainly of concern for reflux  and possible esophagitis or possibly Barrett's.  It is anticipated that  an upper endoscopy may be necessary as the severity of her symptoms has  resulted in  an emergency room visit.  She will be placed on the Protonix  30 minutes before breakfast and 30 minutes before the evening meal.  The  triggers  which include the aspirin family (including naproxen),  alcohol, peppermint, chocolate and caffeine were discussed.  It is  recommended that she take a coated baby aspirin with breakfast.   She will be given stool cards to collect at home prior to her scheduled  GI visit with Dr. [name] on May 16, 2006.     Darrick Penna. Linna Darner, MD,FACP,FCCP  Electronically Signed    WFH/MedQ  DD: 05/07/2006  DT: 05/07/2006  Job #: [id number]

## 2010-06-09 NOTE — Op Note (Signed)
[facility]  Patient:    [name], [name]                      MRN: [id number] Proc. Date: 11/09/99 Adm. Date:  [id number] Attending:  Harlene Ramus                           Operative Report  PREOPERATIVE DIAGNOSIS:  Acute cholecystitis.  POSTOPERATIVE DIAGNOSIS:  Acute cholecystitis.  PROCEDURE:  Laparoscopic cholecystectomy.  SURGEON:  Douglas A. Ninfa [facility], M.D.  ASSISTANT:  Jaci Carrel, M.D.  ANESTHESIA:  General endotracheal anesthesia.  ESTIMATED BLOOD LOSS:  Minimal.  PROCEDURE IN DETAIL:  The patient was brought to the operating room and identified as [name].  She was placed supine on the operating room table and general anesthesia was induced.  Her abdomen was then prepped and draped in usual sterile fashion.  Using a #15 blade, a small transverse incision was made below the umbilicus.  The incision was carried down to the fascia.  The fascia was then opened with a scalpel.  A hemostat was then used to pass into the peritoneal cavity.  Next, a 0 Vicryl purse-string suture was placed around the fascial opening.  The Hasson port was then placed through the opening and insufflation of the abdomen was begun.  Next, an 11 mm port was placed in the patients epigastrium and two 5 mm ports were placed in the patients right flank under direct vision.  The gallbladder was then identified and found to be gangrenous.  Because of the tense nature, needle aspiration was performed to make the gallbladder softer.  The gallbladder was then grasped and retracted above the liver bed. Dissection was then carried out at the base.  The cystic duct was dissected out and clipped three times proximally and once distally and transected with the scissors.  A cystic artery and its posterior branch were likewise identified and clipped twice proximally and one distally and transected as well.  The gallbladder was then slowly dissected free  from the liver bed with the electrocautery.  Hemostasis was achieved in the liver bed with the cautery.  Once the gallbladder was removed, it was placed in an Endosac and pulled out through the port at the umbilicus.  The Vicryl suture at the umbilicus was then tied in place.  Next, the abdomen was copiously irrigated with normal saline.  Hemostasis again appeared to be achieved.  All ports were then removed under direct vision and the abdomen was deflated.  All skin incisions were then anesthetized with 0.25% Marcaine and closed with 4-0 subcuticular Vicryl sutures.  Steri-Strips, gauze and tape were then applied.  The patient tolerated the procedure well. All sponge, needle and instrument counts were correct at the end of the procedure.  The patient was then extubated in the operating room and taken in stable condition to the recovery room. DD:  11/09/99 TD:  11/09/99 Job: [id number]

## 2010-06-09 NOTE — Assessment & Plan Note (Signed)
[facility]                                 ON-CALL NOTE   NAME:[name], [name]                      MRN:          [id number]  DATE:04/06/2006                            DOB:          1940-02-23    PHONE NUMBER: [id number].   HISTORY:  [name] is a very pleasant female patient followed by Dr.  Johnsie Cancel with a history of coronary disease status post Taxus stenting to  the RCA in January of 2007.  She just had cardiac catheterization done  April 01, 2006. Prior to that, Dr. Johnsie Cancel had placed her on Coreg 12.5  mg twice a day for better blood pressure control.  The patient's stent  in her RCA was patent at catheterization.  She did have some  questionable dye reaction with chest pain and ST elevation upon  injection of the dye. It was felt that she was probably having spasm.  Post procedure her blood pressure was fine and she was discharged home  later that day.  The patient notes that she was doing well until  Thursday when she developed a gastroenteritis. She had a lot of  diarrhea. Several other family members had these same symptoms. She felt  somewhat ill and weak from that. Today when she got up she noted that  she felt more weak and felt as though she was going to pass out.  She  denies chest pain or shortness of breath. When I talked to her on the  telephone she was feeling much better although still slightly weak.  She  did take her blood pressure before calling me and it was 79/47.  She has  already taken her medications for her blood pressure this morning.   PLAN:  I explained to the patient to hold her medications for the rest  of today. She is to try to increase her fluid intake as much as possible  throughout today. She should also eat some foods with high salt content.  I have asked her to monitor her blood pressure and call me back in the  next couple of hours if her pressures remain low. Certainly if she  begins to feel worse she knows  to go to the emergency room and she will  probably require IV fluids. I suspect she is probably somewhat  dehydrated from her gastroenteritis. Tomorrow if her pressure is below  140/90, she is to continue to hold her Lisinopril and Coreg.  As her  pressure starts to come back up she can take 1/2 dose of Lisinopril and  the dose of Coreg that she was given. She should monitor her pressures  over the next several days and as her pressures come up, will get back  to her usual dose of medication. She sees Dr. Johnsie Cancel later next week.   DISPOSITION:  As noted above.     Richardson Dopp, PA-C  Electronically Signed      Wallis Bamberg. Johnsie Cancel, MD, [facility]  Electronically Signed   SW/MedQ  DD: 04/06/2006  DT: 04/07/2006  Job #: [id number]

## 2010-06-09 NOTE — Op Note (Signed)
[name], [name]               ACCOUNT NO.:  [id number]   MEDICAL RECORD NO.:  [id number]          PATIENT TYPE:  AMB   LOCATION:  ENDO                         FACILITY:  [facility]   PHYSICIAN:  Nelwyn Salisbury, M.D.  DATE OF BIRTH:  1940-05-20   DATE OF PROCEDURE:  12/10/2003  DATE OF DISCHARGE:                                 OPERATIVE REPORT   PROCEDURE PERFORMED:  Colonoscopy with snare polypectomy x1.   ENDOSCOPIST:  Nelwyn Salisbury, M.D.   INSTRUMENT USED:  Olympus video colonoscope.   INDICATION FOR PROCEDURE:  A 70 year old white female undergoing screening  colonoscopy to rule out colonic polyps, masses, etc.   PREPROCEDURE PREPARATION:  Informed consent was procured from the patient.  The patient was fasted for eight hours prior to the procedure and prepped  with a bottle of magnesium citrate and a gallon of GoLYTELY the night prior  to the procedure.  The risks and benefits of the procedure, including a 10%  miss rate of colon cancers and polyps, were discussed with the patient in  great detail.   PREPROCEDURE PHYSICAL:  VITAL SIGNS:  The patient had stable vital signs.  NECK:  Supple.  CHEST:  Clear to auscultation.  S1, S2 regular.  ABDOMEN:  Soft with normal bowel sounds.   DESCRIPTION OF PROCEDURE:  The patient was placed in the left lateral  decubitus position and sedated with 75 mg of Demerol and 6 mg of Versed in  slow incremental doses.  Once the patient was adequately sedate and  maintained on low-flow oxygen and continuous cardiac monitoring, the Olympus  video colonoscope was advanced from the rectum to the cecum.  The  appendiceal orifice and the ileocecal valve were clearly visualized and  photographed.  A flat polyp was snared from the cecal base close to the IC  valve with low settings (100/5).  There was evidence of early sigmoid  diverticulosis.  Small internal hemorrhoids were seen on retroflexion.  No  other abnormalities were noted.  The patient  tolerated the procedure well  without complications.   IMPRESSION:  1.  Small internal hemorrhoids.  2.  Early sigmoid diverticulosis.  3.  Flat polyp snared from the cecal base (with lower settings).   RECOMMENDATIONS:  1.  Await pathology results.  2.  Avoid all nonsteroidals including aspirin for the next four weeks.  3.  Continue a high-fiber diet with liberal fluid intake.  4.  Outpatient follow-up in the next two weeks for further recommendations.       JNM/MEDQ  D:  12/10/2003  T:  12/11/2003  Job:  [id number]   cc:   Gwenlyn Perking, M.D.  [address]  [place]  [place] [address]  Fax: [phone]   S. Olena Mater, M.D.  [phone] [place]  [place] [address]  Fax: [phone]

## 2010-06-09 NOTE — Cardiovascular Report (Signed)
NAMERYLYNNE, SAAM NO.:  [id number]   MEDICAL RECORD NO.:  [id number]            PATIENT TYPE:   LOCATION:                                 FACILITY:   PHYSICIAN:  [name], M.D.          DATE OF BIRTH:   DATE OF PROCEDURE:  01/26/2005  DATE OF DISCHARGE:                              CARDIAC CATHETERIZATION   [name] is a 64-year patient of myself. She has been having chest pain.  She had cardiac CT which suggested possible flow-limiting lesions in the mid  LAD and distal right coronary artery.  The LAD in particular was very  calcified and eccentric.   Cine catheterization was done from which 6-French catheters from the right  femoral artery.   The left main coronary was normal. The mid LAD fluoroscopically did have  calcium; however, there is no flow-limiting lesion in the mid vessel. There  was 20-30% stenosis. The distal LAD was somewhat unusual.  There was a very  large second diagonal branch which essentially supplied the apex.  The  distal LAD, itself, was very small.  The circumflex coronary artery was  nondominant and normal.   The right coronary artery had a 70% focal lesion in the distal right  coronary artery which appeared to be flow-limiting.   The patient is right coronary artery dominant.   The patient had normal LV function by a cardiac CT and we did not cross the  valve.   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 The films will  be reviewed with Dr. Lia Foyer.  I think that he would be ideally suited for  stenting since there is no side branch and it is a 4-0 vessel.  In the  setting of this lesion with chest pain, I believe percutaneous intervention  is warranted.   The patient will continue aspirin, Plavix, and statin therapy post  angioplasty.           ______________________________  [name], M.D.     PN/MEDQ  D:  01/26/2005  T:  01/26/2005  Job:  [id number]

## 2010-06-09 NOTE — Discharge Summary (Signed)
NAMEURIAH, SAIF NO.:  [id number]   MEDICAL RECORD NO.:  [id number]          PATIENT TYPE:  OIB   LOCATION:  6527                         FACILITY:  [facility]   PHYSICIAN:  Florinda Marker, MD      DATE OF BIRTH:  07/31/1940   DATE OF ADMISSION:  01/26/2005  DATE OF DISCHARGE:  01/28/2005                                 DISCHARGE SUMMARY   DISCHARGE DIAGNOSES:  1.  Unstable angina with positive cardiac CT, status post Taxus stenting to      the right coronary artery.  2.  Multiple cardiac risk factors as described below.   PROCEDURES PERFORMED:  Cardiac catheterization and stenting.   SUMMARY OF HISTORY:  Ms. Rhoades is a 70 year old female who was referred by  Dr. Thea Silversmith for chest discomfort and hyperlipidemia.  Further history of  noninsulin-dependent diabetes, remote tobacco use.  Dr. Johnsie Cancel performed  cardiac CT for further evaluation.  This showed a high calcium score in the  proximal LAD and possible nonobstructive lesion in the RCA; thus, she was  recommended for further evaluation with a cardiac catheterization.   LABORATORY DATA:  Preadmission H&H was 13.4 and 39.1, normal indices,  platelets 280, WBC 6.7.  Sodium 138, potassium 4.5, BUN 18, creatinine 1.0,  glucose 133.  EKG showed normal sinus rhythm, normal axes, insignificant  inferior Q-waves, nonspecific ST-T changes.  Post procedure and prior to  discharge on the 6th, H&H was 11.4 and 32.3, normal indices, platelets of  208, WBC 5.2.  Sodium 136, potassium 4.2, BUN 10, creatinine 0.8, glucose  120.  CK-MB is negative.  Troponin was slightly elevated at 1.0.   HOSPITAL COURSE:  Dr. Johnsie Cancel performed cardiac catheterization on Ms.  Stephens November on January 26, 2005.  She had a 20-30% mid-LAD, 70% distal RCA.  After review with Dr. Lia Foyer, Taxus stenting was performed to the distal  RCA without difficulty.  Post sheath removal and bed rest, the patient was  ambulating in the hall without difficulty.  By  January 28, 2004, she was  ambulating in the halls.  After evaluation by Dr. Ron Parker, it was felt that she  could be discharged home.   DISPOSITION:  She was asked to maintain a low salt, fat and cholesterol ADA  diet.  Her activities and wound care were per supplemental discharge sheet.   Her new medications include:  1.  Nitroglycerin 0.4 mg as needed.  2.  Plavix 75 mg daily.  3.  Toprol XL 25 mg daily.   She was asked to continue her:  1.  Avandia 4 mg daily.  2.  Lisinopril 40 mg daily.  3.  Zetia 10 mg daily.  4.  Paxil 20 mg daily.  5.  Aspirin 325 mg daily.  6.  Niaspan 1000 mg q.h.s.  7.  Her omega-3, calcium and glucosamine as previously.   She was asked to call Dr. Kyla Balzarine office on Monday for a two-week  appointment.  Prior to her discharge, she will be seen by cardiac rehab.      Sharyl Nimrod, P.A. [facility]  ______________________________  Florinda Marker, MD    EW/MEDQ  D:  01/27/2005  T:  01/27/2005  Job:  [id number]   cc:   [name], M.D.  [address]  [place]  [place] [address]   Gwenlyn Perking, M.D.  Fax: [phone]

## 2010-06-09 NOTE — Cardiovascular Report (Signed)
NAMESIRINA, YANDOW               ACCOUNT NO.:  [id number]   MEDICAL RECORD NO.:  [id number]          PATIENT TYPE:  OIB   LOCATION:  6527                         FACILITY:  [facility]   PHYSICIAN:  Loretha Brasil. Lia Foyer, M.D. [facility] OF BIRTH:  09-30-1940   DATE OF PROCEDURE:  01/26/2005  DATE OF DISCHARGE:                              CARDIAC CATHETERIZATION   INDICATIONS:  Ms. Bloomingdale is a delightful 70 year old female with a history  of diabetes.  She was seen by Dr. Johnsie Cancel.  She subsequently underwent CT  angiography which suggested significant calcification of the LAD and  probably high-grade disease as well as calcification of the RCA.  Diagnostic  catheterization revealed a high-grade stenosis in the distal right coronary;  it was fairly short.  However, there was not critical disease in the RCA.  the procedure was done by Dr. Johnsie Cancel and he recommended percutaneous  intervention.  I discussed the options with the patient and her family in  detail and they were agreeable to proceed.   PROCEDURE:  1.  Percutaneous coronary intervention of the right coronary artery.  2.  Percutaneous rotational atherectomy of the right coronary artery.  3.  Percutaneous stenting of the right coronary artery.  4.  Intravascular ultrasound.  5.  Temporary transvenous pacing.   DESCRIPTION OF PROCEDURE:  The patient was brought to the catheterization  laboratory and prepped.  She had undergone diagnostic catheterization by Dr.  Johnsie Cancel.  We began intravenous fluids.  She was given 300 mg of oral  clopidogrel followed by an additional 300 mg of oral cold clopidogrel.  Bivalirudin was used according to protocol.  A JR4 guiding catheter with  sideholes, 7-French, was utilized after a sterile exchanged from a 6- to 7-  [place] sheath.  The lesion was then crossed with essentially a high-torque  floppy wire and we tried to measure the amount of stenosis that would need  to be dilated.  We then used to 2.5- and  2.25-mm balloons to predilate the  lesion, but there was significant waist despite inflations up to 16  atmospheres.  We then followed with intravascular ultrasound; ultrasound  demonstrated circumferential calcification.  There was mixed plaque and  calcification throughout the remainder of the lesion, both before and after  the tightest spot.  With the circumferential calcification, it was felt that  percutaneous rotational atherectomy would be necessary in order to  adequately dilate the vessel.  With this, we placed a temporary transvenous  pacing wire through a right femoral vein approach.  A temporary wire was  placed in the RV apex, tested and found to be appropriate.  ACTs were  appropriate.  We placed a RotaWire down across the lesion, and subsequently  a 1.75 Rotablator bur was passed down into just above the lesion.  Multiple  passes were then performed across the lesion site.  A Rotablator was  subsequently removed.  Further dilatations were done with up to a 3-mm  balloon and then a 3-mm x 28 TAXUS drug-eluting stent was placed across the  lesion and taken up to about 13 atmospheres.  We  then placed a Quantum  Maverick balloon inside the stent and throughout the course of the stent,  did multiple high-level inflations.  Finally, we did elect to take the stent  down around the bend in an area of mild disease in order to cover the bend  because I was concerned about the ending point of the stent and the  possibility of creating a hinge at that location.  The final angiographic  results were excellent.  There were no complications.  She was taken to the  holding area in satisfactory clinical condition.   ANGIOGRAPHIC DATA:  The right coronary is a moderately calcified vessel.  There is intermittent plaque of about 30% throughout the proximal vessel.  In the distal vessel just prior to the acute margin, there is an 80%  eccentric hypodense focal plaque with an area of fairly  marked eccentric  calcification.  Following the stenting procedure, the stenosis was reduced  from 80% down to 0% with an excellent angiographic appearance.  There were  no evident complications and no evidence of edge tears.  We were diligent to  keep the post-dilatation balloon within the confines of the stent.  As  noted, there were no obvious edge tears.   CONCLUSION:  1.  Successful percutaneous stenting of the right coronary artery with      adjunctive rotational atherectomy.  2.  Intravascular ultrasound demonstrated mixed plaque with a small area of      circumferential calcification just above the lesion site.  There was      mixed calcification throughout the proximal vessel with an area of      moderate calcification overlapping the origin of the right ventricular      branch with modest plaque throughout this area.      Loretha Brasil. Lia Foyer, M.D. [facility]  Electronically Signed     TDS/MEDQ  D:  01/26/2005  T:  01/27/2005  Job:  [id number]   cc:   [name], M.D.  [address]  [place] [address]   [facility]   [facility]

## 2010-08-28 ENCOUNTER — Telehealth: Payer: Self-pay | Admitting: Cardiovascular Disease

## 2010-08-28 NOTE — Telephone Encounter (Signed)
Left message for sarah of clearance for pt to hold asa prior to procedure Fredia Beets

## 2010-08-28 NOTE — Telephone Encounter (Signed)
[facility], pt is having carpal-tunnel release procedure and facility is wanting to know if pt can stop taking aspirin. Please return call to advise/discuss.

## 2010-08-29 ENCOUNTER — Telehealth: Payer: Self-pay | Admitting: Cardiovascular Disease

## 2010-08-29 ENCOUNTER — Encounter: Payer: Self-pay | Admitting: *Deleted

## 2010-08-29 NOTE — Telephone Encounter (Signed)
Letter created and faxed to the number provided [name]

## 2010-08-29 NOTE — Telephone Encounter (Signed)
[name] needs a note stating its okay for pt to stop her Asprin. [name] states you gave a verbal but they need it in writing. Fax# [phone]

## 2010-11-17 ENCOUNTER — Other Ambulatory Visit: Payer: Self-pay | Admitting: Obstetrics and Gynecology

## 2010-11-17 DIAGNOSIS — R928 Other abnormal and inconclusive findings on diagnostic imaging of breast: Secondary | ICD-10-CM

## 2010-11-20 ENCOUNTER — Other Ambulatory Visit: Payer: Self-pay | Admitting: Obstetrics and Gynecology

## 2010-11-20 DIAGNOSIS — R928 Other abnormal and inconclusive findings on diagnostic imaging of breast: Secondary | ICD-10-CM

## 2010-12-05 ENCOUNTER — Ambulatory Visit
Admission: RE | Admit: 2010-12-05 | Discharge: 2010-12-05 | Disposition: A | Discharge status: Home / Self Care | Payer: Medicare Other | Source: Ambulatory Visit | Attending: Obstetrics and Gynecology | Admitting: Obstetrics and Gynecology

## 2010-12-05 ENCOUNTER — Other Ambulatory Visit: Payer: Self-pay | Admitting: Obstetrics and Gynecology

## 2010-12-05 ENCOUNTER — Other Ambulatory Visit: Payer: Self-pay | Admitting: Radiology

## 2010-12-05 DIAGNOSIS — R928 Other abnormal and inconclusive findings on diagnostic imaging of breast: Secondary | ICD-10-CM

## 2010-12-05 DIAGNOSIS — C50919 Malignant neoplasm of unspecified site of unspecified female breast: Secondary | ICD-10-CM | POA: Insufficient documentation

## 2010-12-05 DIAGNOSIS — Z853 Personal history of malignant neoplasm of breast: Secondary | ICD-10-CM | POA: Insufficient documentation

## 2010-12-05 HISTORY — DX: Malignant neoplasm of unspecified site of unspecified female breast: C50.919

## 2010-12-06 ENCOUNTER — Other Ambulatory Visit: Payer: Self-pay | Admitting: Obstetrics and Gynecology

## 2010-12-06 DIAGNOSIS — C50911 Malignant neoplasm of unspecified site of right female breast: Secondary | ICD-10-CM

## 2010-12-07 ENCOUNTER — Other Ambulatory Visit: Payer: Self-pay | Admitting: *Deleted

## 2010-12-07 DIAGNOSIS — C50919 Malignant neoplasm of unspecified site of unspecified female breast: Secondary | ICD-10-CM

## 2010-12-07 NOTE — Progress Notes (Signed)
Confirmed [facility] for 12/20/10 at 1215 .  Instructions and contact information given.

## 2010-12-12 ENCOUNTER — Ambulatory Visit
Admission: RE | Admit: 2010-12-12 | Discharge: 2010-12-12 | Disposition: A | Discharge status: Home / Self Care | Payer: Medicare Other | Source: Ambulatory Visit | Attending: Obstetrics and Gynecology | Admitting: Obstetrics and Gynecology

## 2010-12-12 DIAGNOSIS — C50911 Malignant neoplasm of unspecified site of right female breast: Secondary | ICD-10-CM

## 2010-12-12 MED ORDER — GADOBENATE DIMEGLUMINE 529 MG/ML IV SOLN
15.0000 mL | Freq: Once | INTRAVENOUS | Status: AC | PRN
  Administered 2010-12-12: 15 mL via INTRAVENOUS

## 2010-12-13 ENCOUNTER — Ambulatory Visit: Payer: Medicare Other | Admitting: Radiation Oncology

## 2010-12-20 ENCOUNTER — Encounter (INDEPENDENT_AMBULATORY_CARE_PROVIDER_SITE_OTHER): Payer: Self-pay | Admitting: General Surgery

## 2010-12-20 ENCOUNTER — Encounter: Payer: Self-pay | Admitting: Oncology

## 2010-12-20 ENCOUNTER — Ambulatory Visit: Payer: Medicare Other | Attending: General Surgery | Admitting: Physical Therapy

## 2010-12-20 ENCOUNTER — Ambulatory Visit (HOSPITAL_BASED_OUTPATIENT_CLINIC_OR_DEPARTMENT_OTHER): Payer: Medicare Other | Admitting: Oncology

## 2010-12-20 ENCOUNTER — Ambulatory Visit: Payer: Medicare Other

## 2010-12-20 ENCOUNTER — Other Ambulatory Visit (HOSPITAL_BASED_OUTPATIENT_CLINIC_OR_DEPARTMENT_OTHER): Payer: Medicare Other | Admitting: Lab

## 2010-12-20 ENCOUNTER — Ambulatory Visit
Admission: RE | Admit: 2010-12-20 | Discharge: 2010-12-20 | Disposition: A | Discharge status: Home / Self Care | Payer: Medicare Other | Source: Ambulatory Visit | Attending: Radiation Oncology | Admitting: Radiation Oncology

## 2010-12-20 ENCOUNTER — Ambulatory Visit (HOSPITAL_BASED_OUTPATIENT_CLINIC_OR_DEPARTMENT_OTHER): Payer: Medicare Other | Admitting: General Surgery

## 2010-12-20 ENCOUNTER — Telehealth: Payer: Self-pay | Admitting: Oncology

## 2010-12-20 VITALS — BP 168/87 | HR 82 | Temp 97.9°F | Ht 64.0 in | Wt 168.6 lb

## 2010-12-20 VITALS — BP 168/87 | HR 82 | Temp 97.9°F | Resp 20 | Ht 64.0 in | Wt 168.6 lb

## 2010-12-20 DIAGNOSIS — IMO0001 Reserved for inherently not codable concepts without codable children: Secondary | ICD-10-CM | POA: Insufficient documentation

## 2010-12-20 DIAGNOSIS — C50511 Malignant neoplasm of lower-outer quadrant of right female breast: Secondary | ICD-10-CM | POA: Insufficient documentation

## 2010-12-20 DIAGNOSIS — C50919 Malignant neoplasm of unspecified site of unspecified female breast: Secondary | ICD-10-CM

## 2010-12-20 DIAGNOSIS — Z170 Estrogen receptor positive status [ER+]: Secondary | ICD-10-CM | POA: Insufficient documentation

## 2010-12-20 DIAGNOSIS — R293 Abnormal posture: Secondary | ICD-10-CM | POA: Insufficient documentation

## 2010-12-20 DIAGNOSIS — C50519 Malignant neoplasm of lower-outer quadrant of unspecified female breast: Secondary | ICD-10-CM

## 2010-12-20 DIAGNOSIS — D0590 Unspecified type of carcinoma in situ of unspecified breast: Secondary | ICD-10-CM | POA: Insufficient documentation

## 2010-12-20 LAB — COMPREHENSIVE METABOLIC PANEL
ALT: 25 U/L (ref 0–35)
Albumin: 3.9 g/dL (ref 3.5–5.2)
Alkaline Phosphatase: 80 U/L (ref 39–117)
CO2: 27 mEq/L (ref 19–32)
Glucose, Bld: 189 mg/dL — ABNORMAL HIGH (ref 70–99)
Potassium: 4.4 mEq/L (ref 3.5–5.3)
Sodium: 140 mEq/L (ref 135–145)
Total Protein: 6.9 g/dL (ref 6.0–8.3)

## 2010-12-20 LAB — CBC WITH DIFFERENTIAL/PLATELET
BASO%: 0.8 % (ref 0.0–2.0)
Eosinophils Absolute: 0.5 10*3/uL (ref 0.0–0.5)
MCHC: 34.3 g/dL (ref 31.5–36.0)
MONO#: 0.6 10*3/uL (ref 0.1–0.9)
NEUT#: 2.9 10*3/uL (ref 1.5–6.5)
RBC: 4.17 10*6/uL (ref 3.70–5.45)
RDW: 13.3 % (ref 11.2–14.5)
WBC: 6.6 10*3/uL (ref 3.9–10.3)

## 2010-12-20 NOTE — Progress Notes (Signed)
CC: [name]  is a 70 year old [place] woman referred by Dr. Altamese Cabal for evaluation and treatment in the setting of newly diagnosed breast cancer.  HPI: The patient had a screening mammogram July of 2009, which was unremarkable. Screening mammography this year showed a possible mass in the right breast. Right diagnostic mammogram 12/05/2010 confirmed an irregularly marginated oval mass in the right breast at the 6:00 position measuring up to 1.4 cm. On physical exam Dr. [name] was not able to palpate a discrete abnormality, but ultrasound showed an irregularly marginated hypoechoic mass measuring 1.1 cm, and containing microcalcifications.  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 Estrogen receptors were expressed with 100% positivity, same for progesterone receptors, but there was no HER-2 amplification, with the ratio by CISH being 0.96. The MIB-1 was 96%.  With this information the patient underwent bilateral breast MRIs on 12/12/2010, showing a solitary 1.4 cm irregular enhancing mass in the right breast, but no other suspicious masses and no associated adenopathy.  The patient now presents to the multidisciplinary breast cancer clinic for a definitive treatment plan.   Past Medical History  Diagnosis Date  . Elevated cholesterol    The patient also has a history of coronary artery disease, status post stenting; history of hypothyroidism; history of hypertension; history of GERD; and history of diabetes mellitus.  Past Surgical History  Procedure Date  . Cholecystectomy   . Knee surgery 2007  . Breast lumpectomy 1983    Family History  Problem Relation Age of Onset  . Cancer Son    The patient's father died at the age of 79 from cancer "in his temples". The patient's mother died from renal failure at the age of 55. The patient had 3 brothers, one of whom died at the age of 46  from non-Hodgkin's lymphoma. She has one sister. There is no history of breast or ovarian cancer in the family to her knowledge.  Gynecologic history: GX P3. Menarche age 51, last menstrual period in her mid 68s.She used hormone replacement for about 10 years, discontinuing that in 2002.   Social History: She is a retired Radiation protection practitioner. Her husband [name], present today, he is a retired Quarry manager. One daughter died from suicide at the age of 94 in the setting of postpartum depression.A second daughter, [name], is present today. 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 Son [name] 36 is a Quarry manager and [place]. The patient has 7 grandchildren she attends a [employer].  Health maintenance:  The patient  does not have a smoking history on file. She quit smokeless tobacco use about 13 years ago. She reports that she drinks about 3.6 ounces of alcohol per week. .   Cholesterol -- under treatment  Bone density  Nov 2012 at [facility], "normal"  Colonoscopy 2002  (PAP) Nov 2012  Allergies:  Allergies  Allergen Reactions  . Antihistamines, Diphenhydramine-Type   . Codeine        Current Outpatient Prescriptions  Medication Sig Dispense Refill  . aspirin 81 MG tablet Take 81 mg by mouth daily.        . carvedilol (COREG) 12.5 MG tablet Take 12.5 mg by mouth 2 (two) times daily with a meal.        . cholecalciferol (VITAMIN D) 1000 UNITS tablet Take 1,000 Units by mouth daily.        . fish oil-omega-3 fatty acids  1000 MG capsule Take 1,200 mg by mouth daily.        Marland Kitchen lisinopril (PRINIVIL,ZESTRIL) 40 MG tablet Take 40 mg by mouth daily.        Marland Kitchen PARoxetine (PAXIL) 20 MG tablet Take 20 mg by mouth every morning.        Marland Kitchen PRESCRIPTION MEDICATION Take 2 mg by mouth daily. livalo/pitastastatin       . sitaGLIPtin (JANUVIA) 50 MG tablet Take 50 mg by mouth daily.        Marland Kitchen LEVEMIR FLEXPEN 100 UNIT/ML injection       . pantoprazole (PROTONIX) 40 MG  tablet       . SYNTHROID 50 MCG tablet       . WELCHOL 625 MG tablet         ROS she is having no systemic symptoms to suggest extensive disease and in fact had absolutely no complaints at the time of mammography. A detailed review of systems today is entirely noncontributory.  Physical Exam:  Blood pressure 168/87, pulse 82, temperature 97.9 F (36.6 C), height 5\' 4"  (1.626 m), weight 168 lb 9.6 oz (76.476 kg).  Sclerae unicteric Oropharynx clear No peripheral adenopathy Lungs no rales or rhonchi Heart regular rate and rhythm Abd benign MSK no focal spinal tenderness, no peripheral edema Neuro: nonfocal Breasts: Right breast status post recent biopsy. No significant skin changes or nipple retraction. Left breast no suspicious findings  LABS   Results for orders placed in visit on 12/20/10 (from the past 48 hour(s))  CBC WITH DIFFERENTIAL     Status: Normal   Collection Time   12/20/10 12:13 PM      Component Value Range Comment   WBC 6.6  3.9 - 10.3 (10e3/uL)    NEUT# 2.9  1.5 - 6.5 (10e3/uL)    HGB 13.0  11.6 - 15.9 (g/dL)    HCT 38.0  34.8 - 46.6 (%)    Platelets 234  145 - 400 (10e3/uL)    MCV 91.0  79.5 - 101.0 (fL)    MCH 31.2  25.1 - 34.0 (pg)    MCHC 34.3  31.5 - 36.0 (g/dL)    RBC 4.17  3.70 - 5.45 (10e6/uL)    RDW 13.3  11.2 - 14.5 (%)    lymph# 2.6  0.9 - 3.3 (10e3/uL)    MONO# 0.6  0.1 - 0.9 (10e3/uL)    Eosinophils Absolute 0.5  0.0 - 0.5 (10e3/uL)    Basophils Absolute 0.1  0.0 - 0.1 (10e3/uL)    NEUT% 44.3  38.4 - 76.8 (%)    LYMPH% 39.3  14.0 - 49.7 (%)    MONO% 8.6  0.0 - 14.0 (%)    EOS% 7.0  0.0 - 7.0 (%)    BASO% 0.8  0.0 - 2.0 (%)   COMPREHENSIVE METABOLIC PANEL     Status: Abnormal   Collection Time   12/20/10 12:13 PM      Component Value Range Comment   Sodium 140  135 - 145 (mEq/L)    Potassium 4.4  3.5 - 5.3 (mEq/L)    Chloride 104  96 - 112 (mEq/L)    CO2 27  19 - 32 (mEq/L)    Glucose, Bld 189 (*) 70 - 99 (mg/dL)    BUN 21  6 - 23  (mg/dL)    Creatinine, Ser 1.02  0.50 - 1.10 (mg/dL)    Total Bilirubin 0.5  0.3 - 1.2 (mg/dL)    Alkaline Phosphatase 80  39 - 117 (U/L)    AST 24  0 - 37 (U/L)    ALT 25  0 - 35 (U/L)    Total Protein 6.9  6.0 - 8.3 (g/dL)    Albumin 3.9  3.5 - 5.2 (g/dL)    Calcium 9.4  8.4 - 10.5 (mg/dL)        FILMS:  As already discussed  Assessment: 70 year old [place] woman status post right breast biopsy November of 2012 for a clinical T1c N0 invasive ductal carcinoma, grade 2, strongly estrogen and progesterone receptor positive, HER-2 not amplified, with an MIB-1-1 of 96%.  Plan: We spent the better part of her visit today making sure she understood of the biology of her tumor (not only the name but the meaning of being receptor positive, grade 2, etc.). She understands that she will benefit fromd surgery followed by radiation and that as far as survival is concerned this is just as good as having a mastectomy. We then discussed systemic therapy and clearly she will benefit from anti-estrogens after radiation.  A more difficult question is how much benefit she would derive from adjuvant chemotherapy. To help [place] with this borderline decision  we will send on Oncotype DX at the time of surgery. I have set her up to return to see me in January 2013, by which time we should have all information necessary to make a definitive decision. She knows to call for any problems that may develop before then.  Porshe Fleagle C 12/20/2010, 4:15 PM

## 2010-12-20 NOTE — Progress Notes (Signed)
Patient ID: BAYLER MISTER, female   DOB: 1940/03/17, 70 y.o.   MRN: [id number]  Chief Complaint  Patient presents with  . Other    breast cancer    HPI [name] is a 70 y.o. female.  Referred by Dr. Luberta Robertson HPI This is a 31 yof who presents with no complaints referable to her breasts and underwent a screening mammogram showing a right breast mass measuring 14 mm.  Ultrasound shows a right breast mass measuring 11x11x7 mm.  On MRI there is a solitary right breast lesion measuring 1.4x1.2x1.2 cm.  There are no abnormal nodes or other areas of abnormality.  Biopsy was performed showing an invasive ductal carcinoma, grade II, ER positive 100%, PR positive 100%, Ki67 96%, and Her2-neu was not amplified.  She again reports no complaints with her breasts.  Past Medical History  Diagnosis Date  . Elevated cholesterol   . GERD (gastroesophageal reflux disease)   . Thyroid disease   . Hypertension     Past Surgical History  Procedure Date  . Cholecystectomy   . Knee surgery 2007  . Breast lumpectomy 1983    benign biopsy    Family History  Problem Relation Age of Onset  . Cancer Son     Social History History  Substance Use Topics  . Smoking status: Former Smoker    Quit date: 12/19/1997  . Smokeless tobacco: Former Systems developer    Quit date: 01/22/1997  . Alcohol Use: 3.6 oz/week    6 Glasses of wine per week    Allergies  Allergen Reactions  . Antihistamines, Diphenhydramine-Type   . Codeine   . Erythromycin     Current Outpatient Prescriptions  Medication Sig Dispense Refill  . aspirin 81 MG tablet Take 81 mg by mouth daily.        . carvedilol (COREG) 12.5 MG tablet Take 12.5 mg by mouth 2 (two) times daily with a meal.        . cholecalciferol (VITAMIN D) 1000 UNITS tablet Take 1,000 Units by mouth daily.        . fish oil-omega-3 fatty acids 1000 MG capsule Take 1,200 mg by mouth daily.        Marland Kitchen LEVEMIR FLEXPEN 100 UNIT/ML injection       . lisinopril  (PRINIVIL,ZESTRIL) 40 MG tablet Take 40 mg by mouth daily.        . pantoprazole (PROTONIX) 40 MG tablet       . PARoxetine (PAXIL) 20 MG tablet Take 20 mg by mouth every morning.        Marland Kitchen PRESCRIPTION MEDICATION Take 2 mg by mouth daily. livalo/pitastastatin       . sitaGLIPtin (JANUVIA) 50 MG tablet Take 50 mg by mouth daily.        Marland Kitchen SYNTHROID 50 MCG tablet       . WELCHOL 625 MG tablet         Review of Systems Review of Systems  Constitutional: Negative for fever, chills and unexpected weight change.  HENT: Negative for hearing loss, congestion, sore throat, trouble swallowing and voice change.   Eyes: Negative for visual disturbance.  Respiratory: Negative for cough and wheezing.   Cardiovascular: Negative for chest pain, palpitations and leg swelling.  Gastrointestinal: Negative for nausea, vomiting, abdominal pain, diarrhea, constipation, blood in stool, abdominal distention and anal bleeding.  Genitourinary: Negative for hematuria, vaginal bleeding and difficulty urinating.  Musculoskeletal: Negative for arthralgias.  Skin: Negative for rash and wound.  Neurological: Negative for seizures, syncope and headaches.  Hematological: Negative for adenopathy. Does not bruise/bleed easily.  Psychiatric/Behavioral: Negative for confusion.    There were no vitals taken for this visit.  Physical Exam Physical Exam  Constitutional: She appears well-developed and well-nourished.  Neck: Neck supple.  Cardiovascular: Normal rate, regular rhythm and normal heart sounds.   Pulmonary/Chest: Effort normal and breath sounds normal. She has no wheezes. She has no rales. Right breast exhibits no inverted nipple, no mass, no nipple discharge, no skin change and no tenderness. Left breast exhibits no inverted nipple, no mass, no nipple discharge, no skin change and no tenderness. Breasts are symmetrical.  Abdominal: Soft. There is no hepatomegaly.  Lymphadenopathy:    She has no cervical  adenopathy.    She has no axillary adenopathy.       Right: No supraclavicular adenopathy present.       Left: No supraclavicular adenopathy present.    Data Reviewed Clinical Data: Recently biopsy-proven right breast six o'clock  location invasive mammary carcinoma, possibly lobular subtype.  BILATERAL BREAST MRI WITH AND WITHOUT CONTRAST  Technique: Multiplanar, multisequence MR images of both breasts  were obtained prior to and following the intravenous administration  of 66ml of Multihance. Three dimensional images were evaluated at  the independent DynaCad workstation.  Comparison: Prior mammograms and ultrasound  Findings: No abnormal T2-weighted hyperintensity is seen in either  breast except for right breast six o'clock location post biopsy  changes. No lymphadenopathy. In the right breast six o'clock  location, with associated clip artifact, is an irregular enhancing  mass measuring 1.4 x 1.2 x 1.2 cm, corresponding to the biopsy-  proven breast cancer. No other area of abnormal enhancement is  seen in either breast. The overall background parenchymal  enhancement pattern is mild.  IMPRESSION:  Solitary abnormal enhancing right breast mass 6 o'clock location  corresponding to the biopsy-proven breast cancer. No MRI evidence  for multifocal/multicentric or contralateral malignancy.   Assessment    Right breast invasive ductal carcinoma    Plan    We discussed the staging and pathophysiology of breast cancer. We discussed all of the different options for treatment for breast cancer including surgery, chemotherapy, radiation therapy, Herceptin, and antiestrogen therapy.   We discussed a sentinel lymph node biopsy as she does not appear to having lymph node involvement right now. We discussed the performance of that with injection of radioactive tracer and blue dye. We discussed that she would have an incision underneath her axillary hairline. We discussed that there is a  bout a 10-20% chance of having a positive node with a sentinel lymph node biopsy and we will await the permanent pathology to make any other first further decisions in terms of her treatment. One of these options might be to return to the operating room to perform an axillary lymph node dissection. We discussed about a 1-2% risk lifetime of chronic shoulder pain as well as lymphedema associated with a sentinel lymph node biopsy.  We discussed the options for treatment of the breast cancer which included lumpectomy versus a mastectomy. We discussed the performance of the lumpectomy with a wire placement. We discussed a 10-20% chance of a positive margin requiring reexcision in the operating room. We also discussed that she may need radiation therapy or antiestrogen therapy or both if she undergoes lumpectomy. We discussed the mastectomy and the postoperative care for that as well. We discussed that there is no difference in her survival whether she undergoes  lumpectomy with radiation therapy or antiestrogen therapy versus a mastectomy. There is a slight difference in the local recurrence rate being 3-5% with lumpectomy and about 1% with a mastectomy. We discussed the risks of operation including bleeding, infection, possible reoperation. She understands her further therapy will be based on what her stages at the time of her operation. Plan for lumpectomy (wire guided) and sentinel node biopsy.         Kennie Snedden 12/20/2010, 4:28 PM

## 2010-12-20 NOTE — Telephone Encounter (Signed)
called pt lmovm for appt in jan2013 and to rtn call to confirm appt

## 2010-12-21 ENCOUNTER — Encounter: Payer: Self-pay | Admitting: Radiation Oncology

## 2010-12-21 ENCOUNTER — Telehealth: Payer: Self-pay | Admitting: Oncology

## 2010-12-21 ENCOUNTER — Telehealth (INDEPENDENT_AMBULATORY_CARE_PROVIDER_SITE_OTHER): Payer: Self-pay

## 2010-12-21 ENCOUNTER — Other Ambulatory Visit (INDEPENDENT_AMBULATORY_CARE_PROVIDER_SITE_OTHER): Payer: Self-pay | Admitting: General Surgery

## 2010-12-21 DIAGNOSIS — C50911 Malignant neoplasm of unspecified site of right female breast: Secondary | ICD-10-CM

## 2010-12-21 NOTE — Telephone Encounter (Signed)
Called pt to touch base with her after seeing Dr Donne Hazel yesterday in the breast clinic at [facility]. I wanted to give the pt my name and # here at [place]. I advised the pt that we were still working on getting her scheduled for surgery next week and that our office would call her back later today./ [facility]

## 2010-12-21 NOTE — Telephone Encounter (Signed)
pt called and confirm appt for 02/12/2011

## 2010-12-21 NOTE — Progress Notes (Signed)
CC:   [name], M.D. [name] Gave, MD F. [name] Cabal, MD  REFERRING PHYSICIAN:  Herma Mering, MD  DIAGNOSIS:  Invasive ductal carcinoma of the right breast, cT1c, N0, M0, ER positive, PR positive, HER-2/neu negative.  HISTORY OF PRESENT ILLNESS:  The patient is a pleasant 70 year old female who was found to have a suspicious possible mass within the right breast on screening mammography earlier this year.  She presented for diagnostic mammography subsequently and was found to have an irregularly marginated mass within the right breast at the 6 o'clock position.  This measured 1.4 cm.  This was not able to be palpated, but ultrasound did show an irregular hypoechoic mass measuring 1.1 cm and containing microcalcifications which corresponded to this finding.  She did proceed with a biopsy on the same day, and this returned positive for invasive ductal carcinoma, grade 2.  Receptor studies have indicated that the tumor is ER positive and PR positive, as well as HER-2/neu negative. The proliferative index was 96%.  The patient therefore underwent bilateral breast MRI scans on 12/12/2010.  This did show a solitary irregular enhancing mass within the right breast corresponding to the recent finding, and this measured 1.4 cm.  No other suspicious masses were seen in either breast, and no axillary adenopathy present on either side.  Therefore, the patient presents today to multidisciplinary breast clinic, and I am seeing her today for consideration of radiation treatment.  PAST MEDICAL HISTORY:  Hypercholesterolemia, status post cholecystectomy, status post knee surgeries, status post breast surgery in 1983.  CURRENT MEDICATIONS:  Aspirin, Coreg, vitamin D, fish oil, Zestril, Protonix, Paxil, Januvia, Synthroid, Welchol.  ALLERGIES:  ANTIHISTAMINES, CODEINE AND ERYTHROMYCIN.  FAMILY HISTORY:  There is no known family history of malignancy in her family.  She  does have 1 brother with non-Hodgkin's lymphoma.  SOCIAL HISTORY:  The patient is a retired Radiation protection practitioner.  She did have 3 children, and she has 7 grandchildren.  She has no history of smoking. She does drink alcohol on an occasional basis.  REVIEW OF SYSTEMS:  Fully reviewed and documented in the medical chart.  PHYSICAL EXAM:  Vital Signs:  Weight 168 pounds.  Blood pressure 168/87, pulse 82, respiratory rate 20, temperature 97.9.  General:  A well- developed female in no acute distress.  Alert and oriented x3.  HEENT: Normocephalic atraumatic.  Pupils are equal, round, and reactive to light.  Extraocular movements intact.  Oral cavity clear.  Neck:  Supple without any lymphadenopathy.  Cardiovascular:  Regular rate and rhythm. Respiratory:  Clear to auscultation bilaterally.  There were no masses palpable in either breast and no axillary adenopathy present.  GI: Abdomen soft, nontender.  Normal bowel sounds.  Extremities:  No edema present.  Neurologic:  No focal deficits.  IMPRESSION AND PLAN:  The patient is a pleasant 70 year old female with a recent diagnosis of invasive ductal carcinoma of the right breast, which was a grade 2 tumor.  The receptors indicate that it is ER positive, PR positive, HER-2/neu negative, and the proliferative index was 96%.  This was associated with some DCIS as well.  The patient appears to be a good candidate for breast conservation treatment, and she is discussing with Dr. Donne [name] the details of a possible lumpectomy.  I did discuss with her my recommendation for her to subsequently proceeded with an adjuvant course of radiation at the appropriate time.  We therefore did discuss a possible 6-1/2-week course of treatment including the possible side  effects, risks and benefits. Dr. Jana [name] would like to obtain additional information before making a final decision on her systemic treatment recommendation.  Therefore, he is planning to order an  oncotype test.  Therefore, I would like to see the patient back postoperatively after this final decision has been made to help coordinate her radiation.  Therefore, I anticipate that this would likely start approximately 4-5 weeks after her surgery if she is not getting chemotherapy or after she completes an entire course of chemotherapy if this is felt to be appropriate.  This is an ER positive and PR positive tumor.    ______________________________ [name], M.D., Ph.D. JSM/MEDQ  D:  12/21/2010  T:  12/21/2010  Job:  [id number]

## 2010-12-22 ENCOUNTER — Other Ambulatory Visit (INDEPENDENT_AMBULATORY_CARE_PROVIDER_SITE_OTHER): Payer: Self-pay

## 2010-12-22 ENCOUNTER — Other Ambulatory Visit (INDEPENDENT_AMBULATORY_CARE_PROVIDER_SITE_OTHER): Payer: Self-pay | Admitting: General Surgery

## 2010-12-22 DIAGNOSIS — C50911 Malignant neoplasm of unspecified site of right female breast: Secondary | ICD-10-CM

## 2010-12-27 ENCOUNTER — Encounter (HOSPITAL_BASED_OUTPATIENT_CLINIC_OR_DEPARTMENT_OTHER)
Admission: RE | Admit: 2010-12-27 | Discharge: 2010-12-27 | Disposition: A | Discharge status: Home / Self Care | Payer: Medicare Other | Source: Ambulatory Visit | Attending: General Surgery | Admitting: General Surgery

## 2010-12-27 ENCOUNTER — Ambulatory Visit
Admission: RE | Admit: 2010-12-27 | Discharge: 2010-12-27 | Disposition: A | Discharge status: Home / Self Care | Payer: Medicare Other | Source: Ambulatory Visit | Attending: Anesthesiology | Admitting: Anesthesiology

## 2010-12-27 ENCOUNTER — Encounter (HOSPITAL_BASED_OUTPATIENT_CLINIC_OR_DEPARTMENT_OTHER): Payer: Self-pay | Admitting: *Deleted

## 2010-12-27 ENCOUNTER — Other Ambulatory Visit: Payer: Self-pay

## 2010-12-27 LAB — BASIC METABOLIC PANEL
Calcium: 9.6 mg/dL (ref 8.4–10.5)
GFR calc Af Amer: 72 mL/min — ABNORMAL LOW (ref >90)
GFR calc non Af Amer: 62 mL/min — ABNORMAL LOW (ref >90)
Potassium: 4.4 mEq/L (ref 3.5–5.1)
Sodium: 136 mEq/L (ref 135–145)

## 2010-12-27 NOTE — Progress Notes (Signed)
Ekg,cxr,bmet done

## 2010-12-29 ENCOUNTER — Other Ambulatory Visit (INDEPENDENT_AMBULATORY_CARE_PROVIDER_SITE_OTHER): Payer: Self-pay | Admitting: General Surgery

## 2010-12-29 ENCOUNTER — Encounter (HOSPITAL_BASED_OUTPATIENT_CLINIC_OR_DEPARTMENT_OTHER): Payer: Self-pay | Admitting: Anesthesiology

## 2010-12-29 ENCOUNTER — Ambulatory Visit
Admit: 2010-12-29 | Discharge: 2010-12-29 | Disposition: A | Discharge status: Home / Self Care | Payer: Medicare Other | Attending: General Surgery | Admitting: General Surgery

## 2010-12-29 ENCOUNTER — Ambulatory Visit (HOSPITAL_BASED_OUTPATIENT_CLINIC_OR_DEPARTMENT_OTHER): Payer: Medicare Other | Admitting: Anesthesiology

## 2010-12-29 ENCOUNTER — Ambulatory Visit (HOSPITAL_COMMUNITY)
Admission: RE | Admit: 2010-12-29 | Discharge: 2010-12-29 | Disposition: A | Discharge status: Home / Self Care | Payer: Medicare Other | Source: Ambulatory Visit | Attending: General Surgery | Admitting: General Surgery

## 2010-12-29 ENCOUNTER — Ambulatory Visit
Admission: RE | Admit: 2010-12-29 | Discharge: 2010-12-29 | Disposition: A | Discharge status: Home / Self Care | Payer: Medicare Other | Source: Ambulatory Visit | Attending: General Surgery | Admitting: General Surgery

## 2010-12-29 ENCOUNTER — Encounter (HOSPITAL_BASED_OUTPATIENT_CLINIC_OR_DEPARTMENT_OTHER): Payer: Self-pay | Admitting: *Deleted

## 2010-12-29 ENCOUNTER — Ambulatory Visit (HOSPITAL_BASED_OUTPATIENT_CLINIC_OR_DEPARTMENT_OTHER)
Admission: RE | Admit: 2010-12-29 | Discharge: 2010-12-29 | Disposition: A | Discharge status: Home / Self Care | Payer: Medicare Other | Source: Ambulatory Visit | Attending: General Surgery | Admitting: General Surgery

## 2010-12-29 ENCOUNTER — Other Ambulatory Visit (HOSPITAL_COMMUNITY): Payer: Medicare Other

## 2010-12-29 ENCOUNTER — Encounter (HOSPITAL_BASED_OUTPATIENT_CLINIC_OR_DEPARTMENT_OTHER)
Admission: RE | Disposition: A | Discharge status: Home / Self Care | Payer: Self-pay | Source: Ambulatory Visit | Attending: General Surgery

## 2010-12-29 DIAGNOSIS — E119 Type 2 diabetes mellitus without complications: Secondary | ICD-10-CM | POA: Insufficient documentation

## 2010-12-29 DIAGNOSIS — C50919 Malignant neoplasm of unspecified site of unspecified female breast: Secondary | ICD-10-CM

## 2010-12-29 DIAGNOSIS — C50911 Malignant neoplasm of unspecified site of right female breast: Secondary | ICD-10-CM

## 2010-12-29 DIAGNOSIS — K219 Gastro-esophageal reflux disease without esophagitis: Secondary | ICD-10-CM | POA: Insufficient documentation

## 2010-12-29 DIAGNOSIS — I10 Essential (primary) hypertension: Secondary | ICD-10-CM | POA: Insufficient documentation

## 2010-12-29 DIAGNOSIS — Z01812 Encounter for preprocedural laboratory examination: Secondary | ICD-10-CM | POA: Insufficient documentation

## 2010-12-29 DIAGNOSIS — Z01810 Encounter for preprocedural cardiovascular examination: Secondary | ICD-10-CM | POA: Insufficient documentation

## 2010-12-29 HISTORY — DX: Hypothyroidism, unspecified: E03.9

## 2010-12-29 HISTORY — DX: Depression, unspecified: F32.A

## 2010-12-29 HISTORY — DX: Atherosclerotic heart disease of native coronary artery without angina pectoris: I25.10

## 2010-12-29 HISTORY — PX: BREAST LUMPECTOMY: SHX2

## 2010-12-29 HISTORY — DX: Major depressive disorder, single episode, unspecified: F32.9

## 2010-12-29 LAB — POCT HEMOGLOBIN-HEMACUE: Hemoglobin: 13.4 g/dL (ref 12.0–15.0)

## 2010-12-29 SURGERY — BREAST LUMPECTOMY WITH SENTINEL LYMPH NODE BX
Anesthesia: General | Site: Breast | Laterality: Right | Wound class: Clean

## 2010-12-29 MED ORDER — BUPIVACAINE HCL (PF) 0.25 % IJ SOLN
INTRAMUSCULAR | Status: DC | PRN
  Administered 2010-12-29: 20 mL

## 2010-12-29 MED ORDER — PROMETHAZINE HCL 25 MG/ML IJ SOLN: 6.2500 mg | INTRAMUSCULAR | Status: DC | PRN

## 2010-12-29 MED ORDER — LACTATED RINGERS IV SOLN
INTRAVENOUS | Status: DC
  Administered 2010-12-29 (×2): via INTRAVENOUS

## 2010-12-29 MED ORDER — EPHEDRINE SULFATE 50 MG/ML IJ SOLN
INTRAMUSCULAR | Status: DC | PRN
  Administered 2010-12-29: 10 mg via INTRAVENOUS

## 2010-12-29 MED ORDER — MIDAZOLAM HCL 2 MG/2ML IJ SOLN
1.0000 mg | INTRAMUSCULAR | Status: DC | PRN
  Administered 2010-12-29: 1 mg via INTRAVENOUS

## 2010-12-29 MED ORDER — FENTANYL CITRATE 0.05 MG/ML IJ SOLN
25.0000 ug | INTRAMUSCULAR | Status: DC | PRN
  Administered 2010-12-29: 25 ug via INTRAVENOUS

## 2010-12-29 MED ORDER — OXYCODONE-ACETAMINOPHEN 5-325 MG PO TABS: 1.0000 | ORAL_TABLET | Freq: Four times a day (QID) | ORAL | Status: AC | PRN

## 2010-12-29 MED ORDER — CEFAZOLIN SODIUM 1-5 GM-% IV SOLN
1.0000 g | INTRAVENOUS | Status: AC
  Administered 2010-12-29: 1 g via INTRAVENOUS

## 2010-12-29 MED ORDER — ONDANSETRON HCL 4 MG/2ML IJ SOLN
INTRAMUSCULAR | Status: DC | PRN
  Administered 2010-12-29: 4 mg via INTRAVENOUS

## 2010-12-29 MED ORDER — FENTANYL CITRATE 0.05 MG/ML IJ SOLN
INTRAMUSCULAR | Status: DC | PRN
  Administered 2010-12-29 (×2): 50 ug via INTRAVENOUS
  Administered 2010-12-29 (×3): 25 ug via INTRAVENOUS

## 2010-12-29 MED ORDER — OXYCODONE-ACETAMINOPHEN 5-325 MG PO TABS
1.0000 | ORAL_TABLET | Freq: Once | ORAL | Status: AC
  Administered 2010-12-29: 1 via ORAL

## 2010-12-29 MED ORDER — PROPOFOL 10 MG/ML IV EMUL
INTRAVENOUS | Status: DC | PRN
  Administered 2010-12-29: 180 mg via INTRAVENOUS

## 2010-12-29 MED ORDER — TECHNETIUM TC 99M SULFUR COLLOID FILTERED
1.0000 | Freq: Once | INTRAVENOUS | Status: AC | PRN
  Administered 2010-12-29: 1 via INTRADERMAL

## 2010-12-29 MED ORDER — FENTANYL CITRATE 0.05 MG/ML IJ SOLN
50.0000 ug | INTRAMUSCULAR | Status: DC | PRN
  Administered 2010-12-29: 100 ug via INTRAVENOUS

## 2010-12-29 MED ORDER — LIDOCAINE HCL (CARDIAC) 20 MG/ML IV SOLN
INTRAVENOUS | Status: DC | PRN
  Administered 2010-12-29: 40 mg via INTRAVENOUS

## 2010-12-29 MED ORDER — SODIUM CHLORIDE 0.9 % IJ SOLN
INTRAMUSCULAR | Status: DC | PRN
  Administered 2010-12-29: 14:00:00 via SUBCUTANEOUS

## 2010-12-29 SURGICAL SUPPLY — 61 items
APPLIER CLIP 9.375 MED OPEN (MISCELLANEOUS) ×2
BENZOIN TINCTURE PRP APPL 2/3 (GAUZE/BANDAGES/DRESSINGS) ×2 IMPLANT
BINDER BREAST LRG (GAUZE/BANDAGES/DRESSINGS) IMPLANT
BINDER BREAST MEDIUM (GAUZE/BANDAGES/DRESSINGS) IMPLANT
BINDER BREAST XLRG (GAUZE/BANDAGES/DRESSINGS) ×4 IMPLANT
BINDER BREAST XXLRG (GAUZE/BANDAGES/DRESSINGS) IMPLANT
BLADE SURG 15 STRL LF DISP TIS (BLADE) ×1 IMPLANT
BLADE SURG 15 STRL SS (BLADE) ×1
BNDG COHESIVE 4X5 TAN STRL (GAUZE/BANDAGES/DRESSINGS) IMPLANT
CANISTER SUCTION 1200CC (MISCELLANEOUS) IMPLANT
CHLORAPREP W/TINT 26ML (MISCELLANEOUS) ×2 IMPLANT
CLIP APPLIE 9.375 MED OPEN (MISCELLANEOUS) ×1 IMPLANT
CLOTH BEACON ORANGE TIMEOUT ST (SAFETY) ×2 IMPLANT
COVER MAYO STAND STRL (DRAPES) ×2 IMPLANT
COVER PROBE W GEL 5X96 (DRAPES) ×2 IMPLANT
COVER TABLE BACK 60X90 (DRAPES) ×2 IMPLANT
DECANTER SPIKE VIAL GLASS SM (MISCELLANEOUS) IMPLANT
DERMABOND ADVANCED (GAUZE/BANDAGES/DRESSINGS)
DERMABOND ADVANCED .7 DNX12 (GAUZE/BANDAGES/DRESSINGS) IMPLANT
DEVICE DUBIN W/COMP PLATE 8390 (MISCELLANEOUS) IMPLANT
DRAIN CHANNEL 19F RND (DRAIN) IMPLANT
DRAPE LAPAROSCOPIC ABDOMINAL (DRAPES) ×2 IMPLANT
DRAPE U-SHAPE 76X120 STRL (DRAPES) IMPLANT
DRSG TEGADERM 4X4.75 (GAUZE/BANDAGES/DRESSINGS) ×4 IMPLANT
ELECT COATED BLADE 2.86 ST (ELECTRODE) ×2 IMPLANT
ELECT REM PT RETURN 9FT ADLT (ELECTROSURGICAL) ×2
ELECTRODE REM PT RTRN 9FT ADLT (ELECTROSURGICAL) ×1 IMPLANT
EVACUATOR SILICONE 100CC (DRAIN) IMPLANT
GAUZE SPONGE 4X4 12PLY STRL LF (GAUZE/BANDAGES/DRESSINGS) ×2 IMPLANT
GLOVE BIO SURGEON STRL SZ7 (GLOVE) ×4 IMPLANT
GLOVE BIOGEL PI IND STRL 7.5 (GLOVE) ×1 IMPLANT
GLOVE BIOGEL PI INDICATOR 7.5 (GLOVE) ×1
GLOVE ECLIPSE 6.5 STRL STRAW (GLOVE) ×2 IMPLANT
GOWN PREVENTION PLUS XLARGE (GOWN DISPOSABLE) ×4 IMPLANT
KIT MARKER MARGIN INK (KITS) ×2 IMPLANT
NDL SAFETY ECLIPSE 18X1.5 (NEEDLE) ×1 IMPLANT
NEEDLE HYPO 18GX1.5 SHARP (NEEDLE) ×1
NEEDLE HYPO 25X1 1.5 SAFETY (NEEDLE) ×4 IMPLANT
NS IRRIG 1000ML POUR BTL (IV SOLUTION) ×2 IMPLANT
PACK BASIN DAY SURGERY FS (CUSTOM PROCEDURE TRAY) ×2 IMPLANT
PENCIL BUTTON HOLSTER BLD 10FT (ELECTRODE) ×2 IMPLANT
PIN SAFETY STERILE (MISCELLANEOUS) IMPLANT
SLEEVE SCD COMPRESS KNEE MED (MISCELLANEOUS) ×2 IMPLANT
SPONGE LAP 18X18 X RAY DECT (DISPOSABLE) IMPLANT
SPONGE LAP 4X18 X RAY DECT (DISPOSABLE) ×4 IMPLANT
STAPLER VISISTAT 35W (STAPLE) ×2 IMPLANT
STOCKINETTE IMPERVIOUS LG (DRAPES) IMPLANT
STRIP CLOSURE SKIN 1/2X4 (GAUZE/BANDAGES/DRESSINGS) ×2 IMPLANT
SUT MNCRL AB 4-0 PS2 18 (SUTURE) ×4 IMPLANT
SUT SILK 2 0 SH (SUTURE) IMPLANT
SUT VIC AB 2-0 SH 27 (SUTURE) ×3
SUT VIC AB 2-0 SH 27XBRD (SUTURE) ×3 IMPLANT
SUT VIC AB 3-0 SH 27 (SUTURE) ×2
SUT VIC AB 3-0 SH 27X BRD (SUTURE) ×2 IMPLANT
SUT VICRYL AB 3 0 TIES (SUTURE) IMPLANT
SYR CONTROL 10ML LL (SYRINGE) ×4 IMPLANT
TOWEL OR 17X24 6PK STRL BLUE (TOWEL DISPOSABLE) ×2 IMPLANT
TOWEL OR NON WOVEN STRL DISP B (DISPOSABLE) ×2 IMPLANT
TUBE CONNECTING 20X1/4 (TUBING) ×2 IMPLANT
WATER STERILE IRR 1000ML POUR (IV SOLUTION) IMPLANT
YANKAUER SUCT BULB TIP NO VENT (SUCTIONS) ×2 IMPLANT

## 2010-12-29 NOTE — Anesthesia Procedure Notes (Signed)
Procedure Name: LMA Insertion Date/Time: 12/29/2010 1:52 PM Performed by: Edy Belt D Pre-anesthesia Checklist: Patient identified, Emergency Drugs available, Suction available and Patient being monitored Patient Re-evaluated:Patient Re-evaluated prior to inductionOxygen Delivery Method: Circle System Utilized Preoxygenation: Pre-oxygenation with 100% oxygen Intubation Type: IV induction Ventilation: Mask ventilation without difficulty LMA: LMA inserted LMA Size: 4.0 Number of attempts: 1 Placement Confirmation: positive ETCO2 Tube secured with: Tape Dental Injury: Teeth and Oropharynx as per pre-operative assessment

## 2010-12-29 NOTE — Op Note (Signed)
Preoperative diagnosis: Clinical stage I right breast cancer Postoperative diagnosis: Same as above  Procedure: #1 right breast wire-guided lumpectomy #2 right axillary sentinel lymph node biopsy #3 injection of methylene blue dye for sentinel lymph node identification  Surgeon: Dr. Serita Grammes Anesthesia: Gen. With LMA Estimated blood loss: Minimal Specimens: #1 right breast tissue marked with paint chips #2 additional right breast medial margin marked short stitch superior, long stitch lateral, double stitch in the #3 right axillary sentinel lymph node that was blue with a count of [id number]  Complications: None Drains: None Position: To recovery room in stable condition Sponge and needle counts were correct x2 at end of operation  Indications: This is a 70 year old female who presents after undergoing a routine screening mammogram with about a 1.2 cm right breast mass. She underwent core biopsy that showed invasive ductal carcinoma. MRI showed no other abnormalities. She was seen in multidisciplinary clinic and has decided to undergo breast conservation therapy. We discussed the risks and benefits associated with this specifically the risks of additional surgery pending her pathology.  Procedure: After informed consent was obtained she was first taken to the breast center where she had a wire placed under ultrasound guidance. I had her mammogram available for my review in the operating room. She was then taken to the operating room. She underwent administration of 2 g of intravenous cefazolin. Sequential compression devices were placed her legs prior to beginning. She then underwent general anesthesia with an LMA. Her right breast and axilla were then prepped and draped in standard sterile surgical fashion. A surgical timeout was then performed.  I injected a mixture of methylene blue dye and saline in all 4 of the cardinal positions around her areola. These were massaged for 2 minutes. I then  first approached the lesion in the breast. I made a curvilinear incision overlying the wire tract at the 6:00 position in the right breast. Cautery was then used to remove the wire as well as the surrounding tissue all the way down to and including the pectoralis fascia. It looked a little bit close on the medial margin so I did excise a little bit more medial margin. This was then passed off the table and underwent a mammogram which confirmed removal of the lesion clip and the wire. This was also confirmed by Dr. Radford Pax. Hemostasis was then observed. I placed 2 clips in the deep position and one clip in each coronal position around the cavity.  I packed this with a sponge and then I made an incision in her axilla to approach the sentinel node. I carried this through the axillary fascia. The neoprobe was used to identify a sentinel lymph node. The count was 769. This node was excised and there were clearly blue lymphatic channels traveling into this blue node also. This was passed off the table as a specimen. There was no remaining radioactivity in the axilla and I could not identify any blue dye. This was then packed with a sponge. I then closed the breast with 2-0 Vicryl 3-0 Vicryl and 4 Monocryl. I then returned to the axilla. I obtained hemostasis. I then closes with 2-0 Vicryl 3-0 Vicryl 4 Monocryl. I infiltrated quarter percent Marcaine and both incisions. I then placed Steri-Strips and a sterile dressing overlying both. She was extubated in the operating room and transferred to the recovery room in stable condition. A breast binder was also placed.

## 2010-12-29 NOTE — Interval H&P Note (Signed)
History and Physical Interval Note:  12/29/2010 1:25 PM  [name]  has presented today for surgery, with the diagnosis of right breast cancer   The various methods of treatment have been discussed with the patient and family. After consideration of risks, benefits and other options for treatment, the patient has consented to  Procedure(s): BREAST LUMPECTOMY WITH SENTINEL LYMPH NODE BX as a surgical intervention .  The patients' history has been reviewed, patient examined, no change in status, stable for surgery.  I have reviewed the patients' chart and labs.  Questions were answered to the patient's satisfaction.     [name]

## 2010-12-29 NOTE — Transfer of Care (Signed)
Immediate Anesthesia Transfer of Care Note  Patient: [name]  Procedure(s) Performed:  BREAST LUMPECTOMY WITH SENTINEL LYMPH NODE BX - right breast wire guided lumpectomy with right axillary sentinal node biopsy   Patient Location: PACU  Anesthesia Type: General  Level of Consciousness: sedated  Airway & Oxygen Therapy: Patient Spontanous Breathing and Patient connected to face mask oxygen  Post-op Assessment: Report given to PACU RN and Post -op Vital signs reviewed and stable  Post vital signs: Reviewed and stable  Complications: No apparent anesthesia complications

## 2010-12-29 NOTE — Anesthesia Preprocedure Evaluation (Addendum)
Anesthesia Evaluation  Patient identified by MRN, date of birth, ID band Patient awake    Reviewed: Allergy & Precautions, H&P , NPO status , Patient's Chart, lab work & pertinent test results, reviewed documented beta blocker date and time   Airway Mallampati: I TM Distance: >3 FB Neck ROM: Full    Dental  (+) Edentulous Upper   Pulmonary neg pulmonary ROS, former smoker clear to auscultation  Pulmonary exam normal       Cardiovascular hypertension (took carvedilol today), Pt. on medications and Pt. on home beta blockers - angina+ CAD (stable s/p stent 2008, repeat cath 2010 non-obstructive, normal LVF and no chest pain since then) Regular Normal    Neuro/Psych Negative Neurological ROS     GI/Hepatic GERD-  Medicated and Controlled,  Endo/Other  Diabetes mellitus- (glu 117 today), Type 2, Oral Hypoglycemic AgentsHypothyroidism (on replacement)   Renal/GU      Musculoskeletal   Abdominal (+) obese,   Peds  Hematology   Anesthesia Other Findings   Reproductive/Obstetrics                        Anesthesia Physical Anesthesia Plan  ASA: III  Anesthesia Plan: General   Post-op Pain Management:    Induction: Intravenous  Airway Management Planned: LMA  Additional Equipment:   Intra-op Plan:   Post-operative Plan:   Informed Consent: I have reviewed the patients History and Physical, chart, labs and discussed the procedure including the risks, benefits and alternatives for the proposed anesthesia with the patient or authorized representative who has indicated his/her understanding and acceptance.   Dental advisory given  Plan Discussed with: CRNA and Surgeon  Anesthesia Plan Comments: (Plan routine monitors, GA- LMA OK)        Anesthesia Quick Evaluation

## 2010-12-29 NOTE — Anesthesia Postprocedure Evaluation (Signed)
  Anesthesia Post-op Note  Patient: [name]  Procedure(s) Performed:  BREAST LUMPECTOMY WITH SENTINEL LYMPH NODE BX - right breast wire guided lumpectomy with right axillary sentinal node biopsy   Patient Location: PACU  Anesthesia Type: General  Level of Consciousness: awake, alert  and oriented  Airway and Oxygen Therapy: Patient Spontanous Breathing  Post-op Pain: mild  Post-op Assessment: Post-op Vital signs reviewed, Patient's Cardiovascular Status Stable, Respiratory Function Stable, Patent Airway, No signs of Nausea or vomiting and Pain level controlled  Post-op Vital Signs: Reviewed and stable  Complications: No apparent anesthesia complications

## 2010-12-29 NOTE — Progress Notes (Signed)
Emotional support during breast injections °

## 2010-12-29 NOTE — H&P (View-Only) (Signed)
Patient ID: [name], female   DOB: [birth year], 70 y.o.   MRN: [id number]  Chief Complaint  Patient presents with  . Other    breast cancer    HPI [name] is a 70 y.o. female.  Referred by Dr. Luberta Robertson HPI This is a 53 yof who presents with no complaints referable to her breasts and underwent a screening mammogram showing a right breast mass measuring 14 mm.  Ultrasound shows a right breast mass measuring 11x11x7 mm.  On MRI there is a solitary right breast lesion measuring 1.4x1.2x1.2 cm.  There are no abnormal nodes or other areas of abnormality.  Biopsy was performed showing an invasive ductal carcinoma, grade II, ER positive 100%, PR positive 100%, Ki67 96%, and Her2-neu was not amplified.  She again reports no complaints with her breasts.  Past Medical History  Diagnosis Date  . Elevated cholesterol   . GERD (gastroesophageal reflux disease)   . Thyroid disease   . Hypertension     Past Surgical History  Procedure Date  . Cholecystectomy   . Knee surgery 2007  . Breast lumpectomy 1983    benign biopsy    Family History  Problem Relation Age of Onset  . Cancer Son     Social History History  Substance Use Topics  . Smoking status: Former Smoker    Quit date: 12/19/1997  . Smokeless tobacco: Former Systems developer    Quit date: 01/22/1997  . Alcohol Use: 3.6 oz/week    6 Glasses of wine per week    Allergies  Allergen Reactions  . Antihistamines, Diphenhydramine-Type   . Codeine   . Erythromycin     Current Outpatient Prescriptions  Medication Sig Dispense Refill  . aspirin 81 MG tablet Take 81 mg by mouth daily.        . carvedilol (COREG) 12.5 MG tablet Take 12.5 mg by mouth 2 (two) times daily with a meal.        . cholecalciferol (VITAMIN D) 1000 UNITS tablet Take 1,000 Units by mouth daily.        . fish oil-omega-3 fatty acids 1000 MG capsule Take 1,200 mg by mouth daily.        Marland Kitchen LEVEMIR FLEXPEN 100 UNIT/ML injection       . lisinopril  (PRINIVIL,ZESTRIL) 40 MG tablet Take 40 mg by mouth daily.        . pantoprazole (PROTONIX) 40 MG tablet       . PARoxetine (PAXIL) 20 MG tablet Take 20 mg by mouth every morning.        Marland Kitchen PRESCRIPTION MEDICATION Take 2 mg by mouth daily. livalo/pitastastatin       . sitaGLIPtin (JANUVIA) 50 MG tablet Take 50 mg by mouth daily.        Marland Kitchen SYNTHROID 50 MCG tablet       . WELCHOL 625 MG tablet         Review of Systems Review of Systems  Constitutional: Negative for fever, chills and unexpected weight change.  HENT: Negative for hearing loss, congestion, sore throat, trouble swallowing and voice change.   Eyes: Negative for visual disturbance.  Respiratory: Negative for cough and wheezing.   Cardiovascular: Negative for chest pain, palpitations and leg swelling.  Gastrointestinal: Negative for nausea, vomiting, abdominal pain, diarrhea, constipation, blood in stool, abdominal distention and anal bleeding.  Genitourinary: Negative for hematuria, vaginal bleeding and difficulty urinating.  Musculoskeletal: Negative for arthralgias.  Skin: Negative for rash and wound.  Neurological: Negative for seizures, syncope and headaches.  Hematological: Negative for adenopathy. Does not bruise/bleed easily.  Psychiatric/Behavioral: Negative for confusion.    There were no vitals taken for this visit.  Physical Exam Physical Exam  Constitutional: She appears well-developed and well-nourished.  Neck: Neck supple.  Cardiovascular: Normal rate, regular rhythm and normal heart sounds.   Pulmonary/Chest: Effort normal and breath sounds normal. She has no wheezes. She has no rales. Right breast exhibits no inverted nipple, no mass, no nipple discharge, no skin change and no tenderness. Left breast exhibits no inverted nipple, no mass, no nipple discharge, no skin change and no tenderness. Breasts are symmetrical.  Abdominal: Soft. There is no hepatomegaly.  Lymphadenopathy:    She has no cervical  adenopathy.    She has no axillary adenopathy.       Right: No supraclavicular adenopathy present.       Left: No supraclavicular adenopathy present.    Data Reviewed Clinical Data: Recently biopsy-proven right breast six o'clock  location invasive mammary carcinoma, possibly lobular subtype.  BILATERAL BREAST MRI WITH AND WITHOUT CONTRAST  Technique: Multiplanar, multisequence MR images of both breasts  were obtained prior to and following the intravenous administration  of 29ml of Multihance. Three dimensional images were evaluated at  the independent DynaCad workstation.  Comparison: Prior mammograms and ultrasound  Findings: No abnormal T2-weighted hyperintensity is seen in either  breast except for right breast six o'clock location post biopsy  changes. No lymphadenopathy. In the right breast six o'clock  location, with associated clip artifact, is an irregular enhancing  mass measuring 1.4 x 1.2 x 1.2 cm, corresponding to the biopsy-  proven breast cancer. No other area of abnormal enhancement is  seen in either breast. The overall background parenchymal  enhancement pattern is mild.  IMPRESSION:  Solitary abnormal enhancing right breast mass 6 o'clock location  corresponding to the biopsy-proven breast cancer. No MRI evidence  for multifocal/multicentric or contralateral malignancy.   Assessment    Right breast invasive ductal carcinoma    Plan    We discussed the staging and pathophysiology of breast cancer. We discussed all of the different options for treatment for breast cancer including surgery, chemotherapy, radiation therapy, Herceptin, and antiestrogen therapy.   We discussed a sentinel lymph node biopsy as she does not appear to having lymph node involvement right now. We discussed the performance of that with injection of radioactive tracer and blue dye. We discussed that she would have an incision underneath her axillary hairline. We discussed that there is a  bout a 10-20% chance of having a positive node with a sentinel lymph node biopsy and we will await the permanent pathology to make any other first further decisions in terms of her treatment. One of these options might be to return to the operating room to perform an axillary lymph node dissection. We discussed about a 1-2% risk lifetime of chronic shoulder pain as well as lymphedema associated with a sentinel lymph node biopsy.  We discussed the options for treatment of the breast cancer which included lumpectomy versus a mastectomy. We discussed the performance of the lumpectomy with a wire placement. We discussed a 10-20% chance of a positive margin requiring reexcision in the operating room. We also discussed that she may need radiation therapy or antiestrogen therapy or both if she undergoes lumpectomy. We discussed the mastectomy and the postoperative care for that as well. We discussed that there is no difference in her survival whether she undergoes  lumpectomy with radiation therapy or antiestrogen therapy versus a mastectomy. There is a slight difference in the local recurrence rate being 3-5% with lumpectomy and about 1% with a mastectomy. We discussed the risks of operation including bleeding, infection, possible reoperation. She understands her further therapy will be based on what her stages at the time of her operation. Plan for lumpectomy (wire guided) and sentinel node biopsy.         Asa Baudoin 12/20/2010, 4:28 PM

## 2011-01-01 ENCOUNTER — Telehealth: Payer: Self-pay | Admitting: *Deleted

## 2011-01-01 NOTE — Telephone Encounter (Signed)
Left VM for pt to return call concerning [place] from 12/20/10.

## 2011-01-02 ENCOUNTER — Ambulatory Visit (INDEPENDENT_AMBULATORY_CARE_PROVIDER_SITE_OTHER): Payer: Medicare Other | Admitting: General Surgery

## 2011-01-02 ENCOUNTER — Encounter (INDEPENDENT_AMBULATORY_CARE_PROVIDER_SITE_OTHER): Payer: Self-pay | Admitting: General Surgery

## 2011-01-02 VITALS — BP 138/68 | HR 70 | Resp 16 | Ht 63.0 in | Wt 168.0 lb

## 2011-01-02 DIAGNOSIS — Z09 Encounter for follow-up examination after completed treatment for conditions other than malignant neoplasm: Secondary | ICD-10-CM

## 2011-01-02 NOTE — Progress Notes (Signed)
Subjective:     Patient ID: [name], female   DOB: 1940/05/22, 70 y.o.   MRN: [id number]  HPI This is a 70 year old female who underwent a right wire-guided lumpectomy and right axillary sentinel node for her newly diagnosed right breast cancer and Friday. Her pathology still is pending at this point. She comes in today with complaints of some swelling in her right axilla. Otherwise she is doing well without any complaints. She denies any fevers.  Review of Systems     Objective:   Physical Exam Moderate right axillary seroma, incisions clean without infection    Assessment:     S/p right lumpectomy/snbx    Plan:     I will call her with her pathology when it is available. I told her to continue wearing her bra night and  day until she sees me next week. If this is not helping I asked her to change back up with the finder. There is no evidence of any infection currently and I gave her some warning signs to look for call me back if she notices any of these.

## 2011-01-03 ENCOUNTER — Encounter: Payer: Self-pay | Admitting: *Deleted

## 2011-01-03 NOTE — Progress Notes (Signed)
Mailed after appt letter to pt. 

## 2011-01-05 ENCOUNTER — Encounter: Payer: Self-pay | Admitting: *Deleted

## 2011-01-05 NOTE — Progress Notes (Signed)
Ordered Oncotype Dx w/ [facility].  Faxed request to Pathology.

## 2011-01-08 ENCOUNTER — Other Ambulatory Visit (HOSPITAL_COMMUNITY): Payer: Medicare Other

## 2011-01-12 ENCOUNTER — Ambulatory Visit (INDEPENDENT_AMBULATORY_CARE_PROVIDER_SITE_OTHER): Payer: Medicare Other | Admitting: General Surgery

## 2011-01-12 ENCOUNTER — Encounter (INDEPENDENT_AMBULATORY_CARE_PROVIDER_SITE_OTHER): Payer: Self-pay | Admitting: General Surgery

## 2011-01-12 VITALS — BP 150/90 | HR 82 | Temp 97.6°F | Resp 20 | Ht 63.0 in | Wt 167.6 lb

## 2011-01-12 DIAGNOSIS — Z09 Encounter for follow-up examination after completed treatment for conditions other than malignant neoplasm: Secondary | ICD-10-CM

## 2011-01-12 NOTE — Progress Notes (Signed)
Subjective:     Patient ID: [name], female   DOB: 26-Dec-1940, 70 y.o.   MRN: [id number]  HPI This is a 70 year old female who underwent a right wire-guided lumpectomy and right axillary sentinel node for her newly diagnosed right breast cancer. Postop she had some right axillary swelling and has some pain in the lower outer breast.  No fevers.  We discussed her pathology today being a stage I node negative tumor.   Review of Systems     Objective:   Physical Exam  Pulmonary/Chest:         Assessment:     Stage 1 right breast cancer s/p lumpectomy,snbx    Plan:     She is doing pretty well. I think she has some soreness just from surgery but I do not think she currently has any infection at all. I recommended continuing her exercises. I also gave her a prescription to go to the physical therapy class. She also needs appointments to see both medical and radiation oncology. I let him know what her pathology showed before. I think she signed proceed to radiation therapy now. She does still however have an oncotype that is pending per our prior discussions. Her pathology showed a 1.4 cm invasive ductal carcinoma that is 0.1 cm to the anterior margin for an area of about 3-4 mm. There was lymphovascular invasion. There is no evidence of cancer in one appointments out.

## 2011-01-17 ENCOUNTER — Telehealth (INDEPENDENT_AMBULATORY_CARE_PROVIDER_SITE_OTHER): Payer: Self-pay | Admitting: General Surgery

## 2011-01-17 NOTE — Telephone Encounter (Signed)
Please read your staff message from [place] about Neytiri Vanpelt sorry, I didn't mean to open it.Deneise Lever

## 2011-01-19 ENCOUNTER — Encounter: Payer: Self-pay | Admitting: *Deleted

## 2011-01-19 NOTE — Progress Notes (Signed)
Retired Clinical cytogeneticist, 3 children

## 2011-01-24 ENCOUNTER — Other Ambulatory Visit: Payer: Self-pay | Admitting: Oncology

## 2011-01-24 ENCOUNTER — Ambulatory Visit
Admission: RE | Admit: 2011-01-24 | Discharge: 2011-01-24 | Disposition: A | Discharge status: Home / Self Care | Payer: Medicare Other | Source: Ambulatory Visit | Attending: Radiation Oncology | Admitting: Radiation Oncology

## 2011-01-24 ENCOUNTER — Encounter: Payer: Self-pay | Admitting: Radiation Oncology

## 2011-01-24 VITALS — BP 140/84 | HR 96 | Temp 98.0°F | Resp 20 | Ht 63.0 in | Wt 172.5 lb

## 2011-01-24 DIAGNOSIS — C50519 Malignant neoplasm of lower-outer quadrant of unspecified female breast: Secondary | ICD-10-CM

## 2011-01-24 NOTE — Progress Notes (Signed)
Please see the Nurse Progress Note in the MD Initial Consult Encounter for this patient. 

## 2011-01-24 NOTE — Progress Notes (Signed)
Pt states she is concerned about her R breast due to "stinging and burning sensation and a few hard spots, some at her nipple, some at the scar under her breast. Pt denies redness, swelling, fever. She was seen by Dr Donne Hazel 12/21 /12, was told this would resolve. Pt states "it has not gotten any better". Advised pt Dr Lisbeth Renshaw will be notified.

## 2011-01-25 ENCOUNTER — Encounter: Payer: Self-pay | Admitting: *Deleted

## 2011-01-25 ENCOUNTER — Other Ambulatory Visit: Payer: Self-pay | Admitting: Oncology

## 2011-01-25 ENCOUNTER — Encounter: Payer: Self-pay | Admitting: Radiation Oncology

## 2011-01-25 NOTE — Progress Notes (Signed)
CC:   [name] Gave, MD Chauncey Cruel, M.D. Herma Mering, MD  DIAGNOSIS:  Invasive ductal carcinoma of the right breast, pT1c pN0 M0, estrogen receptor positive, progesterone receptor positive, and HER- 2/neu negative.  INTERVAL HISTORY:  [name] to clinic today for followup.  I initially saw her in Breast Clinic and since that time she has undergone a right-sided breast lumpectomy with sentinel lymph node evaluation. Her pathology did reveal an invasive ductal carcinoma which was a grade 3 tumor measuring 1.4 cm.  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 The margins were negative with the closest margin being 0.1 cm from the anterior margin.  Lymphovascular space invasion was identified.  The single lymph node obtained was negative for carcinoma.  Therefore, the patient pathologically had a pT1c pN0 tumor and receptor studies have indicated that the tumor is ER positive, PR positive, and HER-2/neu negative.  She now presents postoperatively for further discussion of adjuvant radiation.  She states that she has done well postoperatively with some expected soreness and she has seen [name] since her surgery, who felt that she was coming along pretty well in terms of her healing.  PHYSICAL EXAMINATION:  Vital Signs:  Weight 172 pounds, blood pressure 140/84, pulse 96, temperature 98, respiratory rate 20.  Neck:  Supple without any lymphadenopathy.  Cardiovascular:  Regular rate and rhythm. Respiratory:  clear to auscultation.  GI:  Abdomen soft, nontender. Normal bowel sounds.  Extremities:  No edema present.  Breasts:  The patient has a well-healed surgical incision within the lower outer quadrant of the right breast and the axillary incision also looks very good.  I detect no unexpected nodularity or suspicious findings within the breast with only the expected postoperative change present.  IMPRESSION AND PLAN:  [name] is a 71 year old  female who is now status post a right-sided lumpectomy for a T1c N0 invasive ductal carcinoma which is estrogen receptor and progesterone receptor positive, as well as HER-2/neu negative.  She is an appropriate candidate for a 6- 1/2-week course of adjuvant radiotherapy and I discussed this with her once again today, including the possible side effects as well as the expected benefits of such treatment.  The patient has seen Dr. Jana [name] regarding possible systemic treatment options and he has proceeded with an Oncotype to clarify the role of chemotherapy.  The patient is anxious to begin treatment as soon as possible if this is indeed her next step, although she understands that radiation is typically given after chemotherapy if this is decided upon.  I would like to begin the treatment within approximately 6 weeks after her surgery, which was completed on 12/29/2010 if she is not going to give chemotherapy.  She is scheduled to see Dr. Jana [name] in a couple of weeks, but I will contact him to see if the Oncotype test is available for interpretation prior to then to see whether it would be feasible to begin radiation within the next couple of weeks.  We discussed all of these issues and we discussed possibly tentatively going ahead and scheduling her for a simulation next week, although this can certainly be canceled if it appears that she may receive chemotherapy.  I spent 20 minutes with [name] today, the majority of which was spent counseling her on her diagnosis of breast cancer and coordinating her care.    ______________________________ Jodelle Gross, M.D., Ph.D. JSM/MEDQ  D:  01/25/2011  T:  01/25/2011  Job:  [id number]

## 2011-01-25 NOTE — Progress Notes (Signed)
Received Oncotype Dx results of 22.  Gave copy to MD.

## 2011-01-26 ENCOUNTER — Other Ambulatory Visit (INDEPENDENT_AMBULATORY_CARE_PROVIDER_SITE_OTHER): Payer: Self-pay | Admitting: General Surgery

## 2011-01-26 ENCOUNTER — Other Ambulatory Visit: Payer: Self-pay | Admitting: Radiation Oncology

## 2011-01-26 ENCOUNTER — Telehealth: Payer: Self-pay | Admitting: Oncology

## 2011-01-26 ENCOUNTER — Ambulatory Visit (HOSPITAL_BASED_OUTPATIENT_CLINIC_OR_DEPARTMENT_OTHER): Payer: Medicare Other | Admitting: Oncology

## 2011-01-26 VITALS — BP 145/75 | HR 97 | Temp 97.0°F | Ht 63.0 in | Wt 166.7 lb

## 2011-01-26 DIAGNOSIS — C50919 Malignant neoplasm of unspecified site of unspecified female breast: Secondary | ICD-10-CM

## 2011-01-26 NOTE — Telephone Encounter (Signed)
Gv appt for [id number]

## 2011-01-26 NOTE — Progress Notes (Signed)
CC: [name]  is a 71 year old [place] woman referred by Dr. Altamese Cabal for evaluation and treatment in the setting of newly diagnosed breast cancer.  HPI: The patient had a screening mammogram July of 2009, which was unremarkable. Screening mammography this year showed a possible mass in the right breast. Right diagnostic mammogram 12/05/2010 confirmed an irregularly marginated oval mass in the right breast at the 6:00 position measuring up to 1.4 cm. On physical exam Dr. [name] was not able to palpate a discrete abnormality, but ultrasound showed an irregularly marginated hypoechoic mass measuring 1.1 cm, and containing microcalcifications.  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 Estrogen receptors were expressed with 100% positivity, same for progesterone receptors, but there was no HER-2 amplification, with the ratio by CISH being 0.96. The MIB-1 was 96%.  With this information the patient underwent bilateral breast MRIs on 12/12/2010, showing a solitary 1.4 cm irregular enhancing mass in the right breast, but no other suspicious masses and no associated adenopathy.  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 Margins were negative. Repeat HER-2 was again negative.   Past Medical History  Diagnosis Date  . Elevated cholesterol   . GERD (gastroesophageal reflux disease)   . Thyroid disease   . Hypertension   . Coronary artery disease     stent 2007  . Depression   . Diabetes mellitus   . Hypothyroidism   . Cancer     right breast  . Breast cancer 12/05/10    R breast, inv mammary, in situ   The patient also has a history of coronary artery disease, status post stenting; history of hypothyroidism; history of hypertension; history of GERD; and  history of diabetes mellitus.  Past Surgical History  Procedure Date  . Cholecystectomy   . Knee surgery 2007  . Carotid stent 2007  . Carpal tunnel release 9/12,8,12    both done  . Dilation and curettage of uterus   . Coronary angioplasty   . Multiple extractions with alveoloplasty   . Breast lumpectomy 1983    benign biopsy  . Breast surgery 2012    R lumpectomy, snbx, ER/PR +, Her2 -, 0/1 node pos.    Family History  Problem Relation Age of Onset  . Cancer Son   . Cancer Father     throat  . Cancer Brother 68    lymphoma   The patient's father died at the age of 70 from cancer "in his temples". The patient's mother died from renal failure at the age of 4. The patient had 3 brothers, one of whom died at the age of 18 from non-Hodgkin's lymphoma. She has one sister. There is no history of breast or ovarian cancer in the family to her knowledge.  Gynecologic history: GX P3. Menarche age 46, last menstrual period in her mid 28s.She used hormone replacement for about 10 years, discontinuing that in 2002.   Social History: She is a retired Radiation protection practitioner. Her husband [name] is a retired Quarry manager. One daughter died from suicide at the age of 98 in the setting of postpartum depression.A second daughter, [name], works in [place] as a Art therapist. Son Dunia Norgren 36 is a Quarry manager in [place]. The patient has 7 grandchildren. She attends a [employer].  Health maintenance:  The  patient  does not have a smoking history on file. She quit smokeless tobacco use about 13 years ago. She reports that she drinks about 3.6 ounces of alcohol per week. .   Cholesterol -- under treatment  Bone density  Nov 2012 at [facility], "normal"  Colonoscopy 2002  (PAP) Nov 2012  Allergies:  Allergies  Allergen Reactions  . Codeine Nausea And Vomiting and Other (See Comments)    Severe stomach cramps  . Antihistamines, Diphenhydramine-Type Other (See  Comments)    Causes hyperactivity  . Erythromycin Hives and Rash    Due to dental work about 50 years ago. Was used in packing and resulted in rash/hives inside and outside of mouth.       Current Outpatient Prescriptions  Medication Sig Dispense Refill  . aspirin 81 MG tablet Take 81 mg by mouth daily.        . BD PEN NEEDLE NANO U/F 32G X 4 MM MISC Daily.      . carvedilol (COREG) 12.5 MG tablet Take 12.5 mg by mouth 2 (two) times daily with a meal.        . cholecalciferol (VITAMIN D) 1000 UNITS tablet Take 1,000 Units by mouth daily.        . fish oil-omega-3 fatty acids 1000 MG capsule Take 1,200 mg by mouth 2 (two) times daily.       Marland Kitchen LEVEMIR FLEXPEN 100 UNIT/ML injection       . lisinopril (PRINIVIL,ZESTRIL) 40 MG tablet Take 40 mg by mouth daily.        Marland Kitchen LIVALO 2 MG TABS Daily.      . pantoprazole (PROTONIX) 40 MG tablet       . PARoxetine (PAXIL) 20 MG tablet Take 20 mg by mouth every morning.        . sitaGLIPtin (JANUVIA) 50 MG tablet Take 50 mg by mouth daily.        Marland Kitchen SYNTHROID 50 MCG tablet       . WELCHOL 625 MG tablet        Interval history: The patient comes today with her husband and daughter to discuss the final results of her pathology and to make a decision regarding chemotherapy.   ROS  she did well with her surgery, with no unusual pain, bleeding, fever, rash, or other complications. She feels a little more tired now than she used to, and she is taking a little bit of arrest every morning and every afternoon after lunch. However she is also not keeping her grandchildren, or back in school, and this may be causing her to "slow down". She [place]. She has not been going since her surgery. I urged her to resume that habit. Otherwise a detailed review of systems today was noncontributory.  Physical Exam:  Blood pressure 145/75, pulse 97, temperature 97 F (36.1 C), height 5\' 3"  (1.6 m), weight 166 lb 11.2 oz (75.615  kg).  Sclerae unicteric Oropharynx clear No peripheral adenopathy Lungs no rales or rhonchi Heart regular rate and rhythm Abd benign MSK no focal spinal tenderness, no peripheral edema Neuro: nonfocal Breasts: Right breast status post recent  lumpectomy. The incisions are healing nicely, without significant underlying induration, redness, or dehiscence. Left breast no suspicious findings  LABS  No results found for this or any previous visit (from the past 48 hour(s)).   FILMS:   CHEST - 2 VIEW  12/27/2010 Comparison: 05/28/2002, 01/03/2005  Findings:  Normal heart size and vascularity. Negative for  pneumonia, edema, collapse, consolidation, effusion or  pneumothorax. Trachea midline. Atherosclerosis of the aorta.  Right midlung peripheral subpleural atelectasis / scarring.  Slightly prominent lower lobe vascular markings.  IMPRESSION:  No acute chest process.    Assessment: 72 year old [place] woman status post right lumpectomy and sentinel lymph node biopsy November of 2012 for a pT1c pN0, Stage I invasive ductal carcinoma, grade 3, strongly estrogen and progesterone receptor positive, HER-2 not amplified, with an MIB-1-1 of 96%. Her Oncotype recurrence score predicts a 14% risk of distant recurrence if her only systemic adjuvant therapy is tamoxifen for five years.  Plan: We spent the better part of her hour-long visit today considering chemotherapy as suggested by NCCN guidelines in this situation. In general, chemotherapy reduces risk of recurrence by about one third. I think this would be accurate in her case, particularly given the high grade and very high proliferation fraction of her tumor. Accordingly we used a 5% risk reduction figure as the basis of discussion. She understands this means if I treat 100 women like her with chemotherapy, only 5 benefits. 85 were already cured and did not need chemotherapy; another 10 would have the cancer recurred despite chemotherapy. The 5  women who do benefit would get a very large benefit however since they were going to have recurrent, incurable breast cancer, and the chemotherapy prevented this.  We discussed the possible toxicities side effects and complications of chemotherapy and after much discussion of the patient opted to proceed with treatment, which will consist of cyclophosphamide and docetaxel x4. She will come to chemotherapy school to get further details on treatment related issues and she will have a port placed by Dr. Donne Hazel. I have let Dr. Lisbeth Renshaw know that she will not be starting her radiation untill April. The patient will meet with [place] again on January 17 so we can write her prescriptions for anti-emetics and explain to her how to take those medications. She has been scheduled for her first chemotherapy treatment January 21. MAGRINAT,GUSTAV C 01/26/2011, 11:24 AM

## 2011-01-29 ENCOUNTER — Telehealth (INDEPENDENT_AMBULATORY_CARE_PROVIDER_SITE_OTHER): Payer: Self-pay

## 2011-01-29 NOTE — Progress Notes (Signed)
Encounter addended by: Andria Rhein, RN on: 01/29/2011  1:51 PM<BR>     Documentation filed: Charges VN

## 2011-01-30 ENCOUNTER — Encounter (HOSPITAL_COMMUNITY): Payer: Self-pay | Admitting: Pharmacy Technician

## 2011-01-30 ENCOUNTER — Encounter: Payer: Self-pay | Admitting: *Deleted

## 2011-01-30 ENCOUNTER — Other Ambulatory Visit: Payer: Medicare Other

## 2011-01-30 NOTE — Telephone Encounter (Signed)
err

## 2011-02-01 ENCOUNTER — Telehealth: Payer: Self-pay | Admitting: Cardiovascular Disease

## 2011-02-01 ENCOUNTER — Encounter: Payer: Self-pay | Admitting: Oncology

## 2011-02-01 NOTE — Telephone Encounter (Signed)
LOV,Stress faxed to Linda/[phone] 02/01/11/KM

## 2011-02-05 ENCOUNTER — Encounter (HOSPITAL_COMMUNITY): Payer: Self-pay

## 2011-02-05 ENCOUNTER — Encounter (HOSPITAL_COMMUNITY)
Admission: RE | Admit: 2011-02-05 | Discharge: 2011-02-05 | Disposition: A | Discharge status: Home / Self Care | Payer: Medicare Other | Source: Ambulatory Visit | Attending: General Surgery | Admitting: General Surgery

## 2011-02-05 HISTORY — DX: Unspecified osteoarthritis, unspecified site: M19.90

## 2011-02-05 LAB — CBC
HCT: 36.9 % (ref 36.0–46.0)
MCH: 30.2 pg (ref 26.0–34.0)
MCV: 88.5 fL (ref 78.0–100.0)
RBC: 4.17 MIL/uL (ref 3.87–5.11)
WBC: 6.7 10*3/uL (ref 4.0–10.5)

## 2011-02-05 LAB — BASIC METABOLIC PANEL
BUN: 17 mg/dL (ref 6–23)
CO2: 25 mEq/L (ref 19–32)
Calcium: 9.3 mg/dL (ref 8.4–10.5)
Chloride: 101 mEq/L (ref 96–112)
Creatinine, Ser: 0.91 mg/dL (ref 0.50–1.10)

## 2011-02-05 NOTE — Patient Instructions (Addendum)
20 [name]  02/05/2011   Your procedure is scheduled on:  02/07/11  Report to [facility] at 6:30 AM.  Call this number if you have problems the morning of surgery: [phone]   Remember:   Do not eat food:After Midnight.  May have clear liquids:until Midnight .  Clear liquids include soda, tea, black coffee, apple or grape juice, broth.  Take these medicines the morning of surgery with A SIP OF WATER:COREG / LIVALO / PROTONIX / PAXIL / SYNTHROID / WELCHOL / TAKE 1/2 DOSE OF LEVEMIR THE NIGHT BEFORE SURGERY    Do not wear jewelry, make-up or nail polish.  Do not wear lotions, powders, or perfumes.   Do not shave 48 hours prior to surgery.  Do not bring valuables to the hospital.  Contacts, dentures or bridgework may not be worn into surgery.  Leave suitcase in the car. After surgery it may be brought to your room.  For patients admitted to the hospital, checkout time is 11:00 AM the day of discharge.   Patients discharged the day of surgery will not be allowed to drive home.  Name and phone number of your driver:   Special Instructions: CHG Shower Use Special Wash: 1/2 bottle night before surgery and 1/2 bottle morning of surgery.   Please read over the following fact sheets that you were given: MRSA Information

## 2011-02-06 ENCOUNTER — Ambulatory Visit (INDEPENDENT_AMBULATORY_CARE_PROVIDER_SITE_OTHER): Payer: Medicare Other | Admitting: General Surgery

## 2011-02-06 ENCOUNTER — Encounter (INDEPENDENT_AMBULATORY_CARE_PROVIDER_SITE_OTHER): Payer: Self-pay | Admitting: General Surgery

## 2011-02-06 VITALS — BP 142/78 | HR 74 | Resp 16 | Ht 63.0 in | Wt 167.4 lb

## 2011-02-06 DIAGNOSIS — Z09 Encounter for follow-up examination after completed treatment for conditions other than malignant neoplasm: Secondary | ICD-10-CM

## 2011-02-06 NOTE — Progress Notes (Signed)
Patient ID: [name], female   DOB: [birth year], 71 y.o.   MRN: [id number]  Chief Complaint  Patient presents with  . Follow-up    Recheck before PAC insertion    HPI [name] is a 71 y.o. female.   HPI This is a 71 year old female status post a right breast wire-guided lumpectomy and some node biopsy for a stage I breast cancer. Due to her oncotype she has decided to undergo chemotherapy. She is doing well. She has no complaints status post surgery. She comes in today to discuss port placement.  Past Medical History  Diagnosis Date  . Elevated cholesterol   . GERD (gastroesophageal reflux disease)   . Thyroid disease   . Hypertension   . Coronary artery disease     stent 2007  . Depression   . Diabetes mellitus   . Hypothyroidism   . Cancer     right breast  . 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 Arthritis     Past Surgical History  Procedure Date  . Cholecystectomy   . Knee surgery 2007  . Carotid stent 2007  . Carpal tunnel release 9/12,8,12    both done  . Dilation and curettage of uterus   . Coronary angioplasty   . Multiple extractions with alveoloplasty   . Foot spurs REMOVED  . Breast lumpectomy 1983    benign biopsy  . Breast surgery 2012    R lumpectomy, snbx, ER/PR +, Her2 -, 0/1 node pos.    Family History  Problem Relation Age of Onset  . Cancer Son   . Cancer Father     throat  . Cancer Brother 21    lymphoma    Social History History  Substance Use Topics  . Smoking status: Former Smoker -- 2.0 packs/day for 40 years    Quit date: 12/19/1997  . Smokeless tobacco: Former Systems developer    Quit date: 01/22/1997  . Alcohol Use: 3.6 oz/week    6 Glasses of wine per week     occassional    Allergies  Allergen Reactions  . Codeine Nausea And Vomiting and Other (See Comments)    Severe stomach cramps  . Antihistamines, Diphenhydramine-Type Other (See Comments)    Causes hyperactivity  . Erythromycin Hives and  Rash    Due to dental work about 50 years ago. Was used in packing and resulted in rash/hives inside and outside of mouth.    Current Outpatient Prescriptions  Medication Sig Dispense Refill  . aspirin 81 MG tablet Take 81 mg by mouth daily before breakfast.       . BD PEN NEEDLE NANO U/F 32G X 4 MM MISC Daily.      . carvedilol (COREG) 12.5 MG tablet Take 6.25 mg by mouth 2 (two) times daily with a meal.       . cholecalciferol (VITAMIN D) 1000 UNITS tablet Take 1,000 Units by mouth daily.        Marland Kitchen LEVEMIR FLEXPEN 100 UNIT/ML injection Inject 100 Units into the skin daily after breakfast.       . lisinopril (PRINIVIL,ZESTRIL) 40 MG tablet Take 40 mg by mouth daily after breakfast.       . LIVALO 2 MG TABS Take 2 mg by mouth Daily.       . Omega-3 Fatty Acids (FISH OIL) 1200 MG CAPS Take 1 capsule by mouth 2 (two) times daily.        Marland Kitchen  pantoprazole (PROTONIX) 40 MG tablet Take 40 mg by mouth daily.       Marland Kitchen PARoxetine (PAXIL) 20 MG tablet Take 20 mg by mouth every morning.       . sitaGLIPtin (JANUVIA) 50 MG tablet Take 50 mg by mouth daily.       Marland Kitchen SYNTHROID 50 MCG tablet Take 50 mcg by mouth daily before breakfast.       . WELCHOL 625 MG tablet Take 1,250 mg by mouth 2 (two) times daily with a meal.         Review of Systems Review of Systems  Blood pressure 142/78, pulse 74, resp. rate 16, height 5\' 3"  (1.6 m), weight 167 lb 6.4 oz (75.932 kg).  Physical Exam Physical Exam  Cardiovascular: Normal rate, regular rhythm and normal heart sounds.   Pulmonary/Chest: Effort normal and breath sounds normal.      Assessment    Stage I breast cancer Need for venous access for chemotherapy    Plan    We discussed port placement with risks of bleeding, infection, pneumothorax.  Plan placement tomorrow.       Kallum Jorgensen 02/06/2011, 8:53 AM

## 2011-02-07 ENCOUNTER — Encounter (HOSPITAL_COMMUNITY): Payer: Self-pay | Admitting: *Deleted

## 2011-02-07 ENCOUNTER — Encounter (HOSPITAL_COMMUNITY): Payer: Self-pay | Admitting: Anesthesiology

## 2011-02-07 ENCOUNTER — Encounter (HOSPITAL_COMMUNITY)
Admission: RE | Disposition: A | Discharge status: Home / Self Care | Payer: Self-pay | Source: Ambulatory Visit | Attending: General Surgery

## 2011-02-07 ENCOUNTER — Ambulatory Visit (HOSPITAL_COMMUNITY)
Admission: RE | Admit: 2011-02-07 | Discharge: 2011-02-07 | Disposition: A | Discharge status: Home / Self Care | Payer: Medicare Other | Source: Ambulatory Visit | Attending: General Surgery | Admitting: General Surgery

## 2011-02-07 ENCOUNTER — Ambulatory Visit (HOSPITAL_COMMUNITY): Payer: Medicare Other | Admitting: Anesthesiology

## 2011-02-07 ENCOUNTER — Ambulatory Visit (HOSPITAL_COMMUNITY): Payer: Medicare Other

## 2011-02-07 ENCOUNTER — Encounter: Payer: Self-pay | Admitting: *Deleted

## 2011-02-07 DIAGNOSIS — Z79899 Other long term (current) drug therapy: Secondary | ICD-10-CM | POA: Insufficient documentation

## 2011-02-07 DIAGNOSIS — K219 Gastro-esophageal reflux disease without esophagitis: Secondary | ICD-10-CM | POA: Insufficient documentation

## 2011-02-07 DIAGNOSIS — I10 Essential (primary) hypertension: Secondary | ICD-10-CM | POA: Insufficient documentation

## 2011-02-07 DIAGNOSIS — E039 Hypothyroidism, unspecified: Secondary | ICD-10-CM | POA: Insufficient documentation

## 2011-02-07 DIAGNOSIS — I2510 Atherosclerotic heart disease of native coronary artery without angina pectoris: Secondary | ICD-10-CM | POA: Insufficient documentation

## 2011-02-07 DIAGNOSIS — C50919 Malignant neoplasm of unspecified site of unspecified female breast: Secondary | ICD-10-CM

## 2011-02-07 DIAGNOSIS — Z87891 Personal history of nicotine dependence: Secondary | ICD-10-CM | POA: Insufficient documentation

## 2011-02-07 DIAGNOSIS — E119 Type 2 diabetes mellitus without complications: Secondary | ICD-10-CM | POA: Insufficient documentation

## 2011-02-07 DIAGNOSIS — Z7982 Long term (current) use of aspirin: Secondary | ICD-10-CM | POA: Insufficient documentation

## 2011-02-07 DIAGNOSIS — Z01812 Encounter for preprocedural laboratory examination: Secondary | ICD-10-CM | POA: Insufficient documentation

## 2011-02-07 HISTORY — PX: PORTACATH PLACEMENT: SHX2246

## 2011-02-07 SURGERY — INSERTION, TUNNELED CENTRAL VENOUS DEVICE, WITH PORT
Anesthesia: Monitor Anesthesia Care | Site: Chest | Wound class: Clean

## 2011-02-07 MED ORDER — SODIUM CHLORIDE 0.9 % IR SOLN
Status: DC | PRN
  Administered 2011-02-07: 1000 mL

## 2011-02-07 MED ORDER — SODIUM CHLORIDE 0.9 % IR SOLN
Status: DC
  Filled 2011-02-07: qty 1.2

## 2011-02-07 MED ORDER — BUPIVACAINE HCL (PF) 0.25 % IJ SOLN
INTRAMUSCULAR | Status: DC | PRN
  Administered 2011-02-07: 7 mL

## 2011-02-07 MED ORDER — LIDOCAINE HCL 1 % IJ SOLN
INTRAMUSCULAR | Status: DC | PRN
  Administered 2011-02-07: 7 mL

## 2011-02-07 MED ORDER — MIDAZOLAM HCL 5 MG/5ML IJ SOLN
INTRAMUSCULAR | Status: DC | PRN
  Administered 2011-02-07 (×2): 1 mg via INTRAVENOUS

## 2011-02-07 MED ORDER — FENTANYL CITRATE 0.05 MG/ML IJ SOLN: 25.0000 ug | INTRAMUSCULAR | Status: DC | PRN

## 2011-02-07 MED ORDER — BUPIVACAINE HCL (PF) 0.25 % IJ SOLN
INTRAMUSCULAR | Status: AC
  Filled 2011-02-07: qty 30

## 2011-02-07 MED ORDER — FENTANYL CITRATE 0.05 MG/ML IJ SOLN
INTRAMUSCULAR | Status: DC | PRN
  Administered 2011-02-07 (×2): 50 ug via INTRAVENOUS

## 2011-02-07 MED ORDER — HEPARIN SOD (PORK) LOCK FLUSH 100 UNIT/ML IV SOLN
INTRAVENOUS | Status: AC
  Filled 2011-02-07: qty 10

## 2011-02-07 MED ORDER — CEFAZOLIN SODIUM 1-5 GM-% IV SOLN
INTRAVENOUS | Status: AC
  Filled 2011-02-07: qty 100

## 2011-02-07 MED ORDER — CEFAZOLIN SODIUM-DEXTROSE 2-3 GM-% IV SOLR
2.0000 g | INTRAVENOUS | Status: AC
  Administered 2011-02-07: 2 g via INTRAVENOUS
  Filled 2011-02-07: qty 50

## 2011-02-07 MED ORDER — LIDOCAINE HCL 1 % IJ SOLN
INTRAMUSCULAR | Status: AC
  Filled 2011-02-07: qty 20

## 2011-02-07 MED ORDER — HEPARIN SOD (PORK) LOCK FLUSH 100 UNIT/ML IV SOLN
INTRAVENOUS | Status: DC | PRN
  Administered 2011-02-07: 500 [IU] via INTRAVENOUS

## 2011-02-07 MED ORDER — PROPOFOL 10 MG/ML IV EMUL
INTRAVENOUS | Status: DC | PRN
  Administered 2011-02-07: 75 ug/kg/min via INTRAVENOUS

## 2011-02-07 MED ORDER — SODIUM CHLORIDE 0.9 % IR SOLN
Status: DC | PRN
  Administered 2011-02-07: 09:00:00

## 2011-02-07 MED ORDER — LACTATED RINGERS IV SOLN: INTRAVENOUS | Status: DC

## 2011-02-07 MED ORDER — LACTATED RINGERS IV SOLN
INTRAVENOUS | Status: DC | PRN
  Administered 2011-02-07: 08:00:00 via INTRAVENOUS

## 2011-02-07 MED ORDER — INSULIN ASPART 100 UNIT/ML ~~LOC~~ SOLN
4.0000 [IU] | Freq: Once | SUBCUTANEOUS | Status: AC
  Administered 2011-02-07: 4 [IU] via SUBCUTANEOUS

## 2011-02-07 MED ORDER — LIDOCAINE-EPINEPHRINE 1 %-1:100000 IJ SOLN
INTRAMUSCULAR | Status: AC
  Filled 2011-02-07: qty 1

## 2011-02-07 MED ORDER — INSULIN ASPART 100 UNIT/ML ~~LOC~~ SOLN
SUBCUTANEOUS | Status: AC
  Filled 2011-02-07: qty 3

## 2011-02-07 SURGICAL SUPPLY — 44 items
BAG DECANTER FOR FLEXI CONT (MISCELLANEOUS) ×2 IMPLANT
BENZOIN TINCTURE PRP APPL 2/3 (GAUZE/BANDAGES/DRESSINGS) IMPLANT
BLADE HEX COATED 2.75 (ELECTRODE) ×2 IMPLANT
BLADE SURG 15 STRL LF DISP TIS (BLADE) ×1 IMPLANT
BLADE SURG 15 STRL SS (BLADE) ×1
BLADE SURG SZ11 CARB STEEL (BLADE) ×2 IMPLANT
CLOTH BEACON ORANGE TIMEOUT ST (SAFETY) ×2 IMPLANT
DECANTER SPIKE VIAL GLASS SM (MISCELLANEOUS) ×2 IMPLANT
DERMABOND ADVANCED (GAUZE/BANDAGES/DRESSINGS)
DERMABOND ADVANCED .7 DNX12 (GAUZE/BANDAGES/DRESSINGS) IMPLANT
DRAPE C-ARM 42X72 X-RAY (DRAPES) ×2 IMPLANT
DRAPE LAPAROSCOPIC ABDOMINAL (DRAPES) ×2 IMPLANT
DRSG TEGADERM 2-3/8X2-3/4 SM (GAUZE/BANDAGES/DRESSINGS) IMPLANT
DRSG TEGADERM 4X4.75 (GAUZE/BANDAGES/DRESSINGS) ×2 IMPLANT
ELECT REM PT RETURN 9FT ADLT (ELECTROSURGICAL) ×2
ELECTRODE REM PT RTRN 9FT ADLT (ELECTROSURGICAL) ×1 IMPLANT
GAUZE SPONGE 4X4 16PLY XRAY LF (GAUZE/BANDAGES/DRESSINGS) ×2 IMPLANT
GLOVE BIO SURGEON STRL SZ7 (GLOVE) ×6 IMPLANT
GLOVE BIOGEL PI IND STRL 7.0 (GLOVE) ×1 IMPLANT
GLOVE BIOGEL PI IND STRL 7.5 (GLOVE) ×3 IMPLANT
GLOVE BIOGEL PI INDICATOR 7.0 (GLOVE) ×1
GLOVE BIOGEL PI INDICATOR 7.5 (GLOVE) ×3
GOWN PREVENTION PLUS LG XLONG (DISPOSABLE) ×2 IMPLANT
GOWN PREVENTION PLUS XLARGE (GOWN DISPOSABLE) ×2 IMPLANT
GOWN STRL NON-REIN LRG LVL3 (GOWN DISPOSABLE) ×2 IMPLANT
GOWN STRL REIN XL XLG (GOWN DISPOSABLE) ×2 IMPLANT
KIT BASIN OR (CUSTOM PROCEDURE TRAY) ×2 IMPLANT
KIT PORT POWER ISP 8FR (Catheter) ×2 IMPLANT
KIT POWER CATH 8FR (Catheter) IMPLANT
NEEDLE HYPO 25X1 1.5 SAFETY (NEEDLE) ×2 IMPLANT
NS IRRIG 1000ML POUR BTL (IV SOLUTION) ×2 IMPLANT
PACK BASIC VI WITH GOWN DISP (CUSTOM PROCEDURE TRAY) ×2 IMPLANT
PENCIL BUTTON HOLSTER BLD 10FT (ELECTRODE) ×2 IMPLANT
SPONGE GAUZE 4X4 12PLY (GAUZE/BANDAGES/DRESSINGS) IMPLANT
STRIP CLOSURE SKIN 1/2X4 (GAUZE/BANDAGES/DRESSINGS) ×2 IMPLANT
SUT MNCRL AB 4-0 PS2 18 (SUTURE) ×2 IMPLANT
SUT PROLENE 2 0 SH DA (SUTURE) ×2 IMPLANT
SUT SILK 2 0 (SUTURE)
SUT SILK 2-0 30XBRD TIE 12 (SUTURE) IMPLANT
SUT VIC AB 3-0 SH 27 (SUTURE) ×1
SUT VIC AB 3-0 SH 27XBRD (SUTURE) ×1 IMPLANT
SYR CONTROL 10ML LL (SYRINGE) ×2 IMPLANT
SYRINGE 10CC LL (SYRINGE) ×2 IMPLANT
TOWEL OR 17X26 10 PK STRL BLUE (TOWEL DISPOSABLE) ×2 IMPLANT

## 2011-02-07 NOTE — Progress Notes (Signed)
[facility] Psychosocial Distress Screening Clinical Social Work  Clinical Social Work was referred by distress screening protocol.  The patient scored a 7 on the Psychosocial Distress Thermometer which indicatesmoderate distress. Clinical Social Worker spoke with the patient to assess for distress and other psychosocial needs. The patient states she had surgery to have her PORT placed today and plans to begin chemo soon. The patient states she has no needs at this time. CSW informed pt of [facility] support services that were available to her and encouraged her to contact if any needs should arise.   Clinical Social Worker follow up needed: no  If yes, follow up plan:  Polo Riley, MSW, [place] [phone]

## 2011-02-07 NOTE — Progress Notes (Signed)
Patient took 30 units Levemir insulin 02/06/2011 at 1930.

## 2011-02-07 NOTE — Op Note (Signed)
Preoperative diagnosis: Stage I breast cancer Postoperative diagnosis: Same as above  Procedure: Left subclavian power port insertion Surgeon: Dr. Serita Grammes Anesthesia: Monitored anesthesia care Estimated blood loss: Minimal Drains: None Specimens: None Sponge and needle count correct x2 Operation Disposition patient to recovery in stable condition  Indications: This is a 71 year old female who is now status post a right-sided lumpectomy with sentinel node biopsy. She had an oncotype score of 14. She along with medical oncology has elected to undergo chemotherapy. I discussed Port-A-Cath placement for venous access.  Procedure: After informed consent was obtained the patient was taken to the operating room. She was administered1 g of intravenous cefazolin. She had sequential compression devices on her legs. She was placed under monitored anesthesia care. Arms were tucked and appropriately padded. Axillary roll was placed. She was prepped and draped in standard sterile surgical fashion. A surgical timeout was performed.  I then infiltrated a mixture of 1% lidocaine and quarter percent Marcaine throughout her left chest. It then took me several tries to access her left subclavian vein. Once I had done this I passed the wire. This was confirmed by fluoroscopy. I then made a pocket below this overlying the pectoralis fascia. I then tunneled the line between the 2. The dilator was in place. I then placed the sheath dilator assembly under direct vision with fluoroscopy and removed the wire. I then passed the line and removed the peel-away sheath. I pulled this back to be in the distal vena cava. I then attached this to the port and sewed this into position in 3 places with 2-0 Prolene suture. This flushed and aspirated blood. I packed this with heparin. This was then closed with 3-0 Vicryl 4-0 Monocryl and Dermabond. She tolerated this well was transferred to recovery room in stable condition.

## 2011-02-07 NOTE — Interval H&P Note (Signed)
History and Physical Interval Note:  02/07/2011 8:35 AM  [name]  has presented today for surgery, with the diagnosis of right breast cancer  The various methods of treatment have been discussed with the patient and family. After consideration of risks, benefits and other options for treatment, the patient has consented to  Procedure(s): INSERTION PORT-A-CATH as a surgical intervention .  The patients' history has been reviewed, patient examined, no change in status, stable for surgery.  I have reviewed the patients' chart and labs.  Questions were answered to the patient's satisfaction.     Francies Inch

## 2011-02-07 NOTE — H&P (View-Only) (Signed)
Patient ID: [name], female   DOB: [birth year], 71 y.o.   MRN: [id number]  Chief Complaint  Patient presents with  . Follow-up    Recheck before PAC insertion    HPI [name] is a 71 y.o. female.   HPI This is a 71 year old female status post a right breast wire-guided lumpectomy and some node biopsy for a stage I breast cancer. Due to her oncotype she has decided to undergo chemotherapy. She is doing well. She has no complaints status post surgery. She comes in today to discuss port placement.  Past Medical History  Diagnosis Date  . Elevated cholesterol   . GERD (gastroesophageal reflux disease)   . Thyroid disease   . Hypertension   . Coronary artery disease     stent 2007  . Depression   . Diabetes mellitus   . Hypothyroidism   . Cancer     right breast  . 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 Arthritis     Past Surgical History  Procedure Date  . Cholecystectomy   . Knee surgery 2007  . Carotid stent 2007  . Carpal tunnel release 9/12,8,12    both done  . Dilation and curettage of uterus   . Coronary angioplasty   . Multiple extractions with alveoloplasty   . Foot spurs REMOVED  . Breast lumpectomy 1983    benign biopsy  . Breast surgery 2012    R lumpectomy, snbx, ER/PR +, Her2 -, 0/1 node pos.    Family History  Problem Relation Age of Onset  . Cancer Son   . Cancer Father     throat  . Cancer Brother 87    lymphoma    Social History History  Substance Use Topics  . Smoking status: Former Smoker -- 2.0 packs/day for 40 years    Quit date: 12/19/1997  . Smokeless tobacco: Former Systems developer    Quit date: 01/22/1997  . Alcohol Use: 3.6 oz/week    6 Glasses of wine per week     occassional    Allergies  Allergen Reactions  . Codeine Nausea And Vomiting and Other (See Comments)    Severe stomach cramps  . Antihistamines, Diphenhydramine-Type Other (See Comments)    Causes hyperactivity  . Erythromycin Hives and  Rash    Due to dental work about 50 years ago. Was used in packing and resulted in rash/hives inside and outside of mouth.    Current Outpatient Prescriptions  Medication Sig Dispense Refill  . aspirin 81 MG tablet Take 81 mg by mouth daily before breakfast.       . BD PEN NEEDLE NANO U/F 32G X 4 MM MISC Daily.      . carvedilol (COREG) 12.5 MG tablet Take 6.25 mg by mouth 2 (two) times daily with a meal.       . cholecalciferol (VITAMIN D) 1000 UNITS tablet Take 1,000 Units by mouth daily.        Marland Kitchen LEVEMIR FLEXPEN 100 UNIT/ML injection Inject 100 Units into the skin daily after breakfast.       . lisinopril (PRINIVIL,ZESTRIL) 40 MG tablet Take 40 mg by mouth daily after breakfast.       . LIVALO 2 MG TABS Take 2 mg by mouth Daily.       . Omega-3 Fatty Acids (FISH OIL) 1200 MG CAPS Take 1 capsule by mouth 2 (two) times daily.        Marland Kitchen  pantoprazole (PROTONIX) 40 MG tablet Take 40 mg by mouth daily.       Marland Kitchen PARoxetine (PAXIL) 20 MG tablet Take 20 mg by mouth every morning.       . sitaGLIPtin (JANUVIA) 50 MG tablet Take 50 mg by mouth daily.       Marland Kitchen SYNTHROID 50 MCG tablet Take 50 mcg by mouth daily before breakfast.       . WELCHOL 625 MG tablet Take 1,250 mg by mouth 2 (two) times daily with a meal.         Review of Systems Review of Systems  Blood pressure 142/78, pulse 74, resp. rate 16, height 5\' 3"  (1.6 m), weight 167 lb 6.4 oz (75.932 kg).  Physical Exam Physical Exam  Cardiovascular: Normal rate, regular rhythm and normal heart sounds.   Pulmonary/Chest: Effort normal and breath sounds normal.      Assessment    Stage I breast cancer Need for venous access for chemotherapy    Plan    We discussed port placement with risks of bleeding, infection, pneumothorax.  Plan placement tomorrow.       [name] 02/06/2011, 8:53 AM

## 2011-02-07 NOTE — Anesthesia Postprocedure Evaluation (Signed)
  Anesthesia Post-op Note  Patient: [name]  Procedure(s) Performed:  INSERTION PORT-A-CATH  Patient Location: PACU  Anesthesia Type: General  Level of Consciousness: awake and alert   Airway and Oxygen Therapy: Patient Spontanous Breathing  Post-op Pain: mild  Post-op Assessment: Post-op Vital signs reviewed, Patient's Cardiovascular Status Stable, Respiratory Function Stable, Patent Airway and No signs of Nausea or vomiting  Post-op Vital Signs: stable  Complications: No apparent anesthesia complications

## 2011-02-07 NOTE — Transfer of Care (Signed)
Immediate Anesthesia Transfer of Care Note  Patient: [name]  Procedure(s) Performed:  INSERTION PORT-A-CATH  Patient Location: PACU  Anesthesia Type: MAC  Level of Consciousness: awake and alert   Airway & Oxygen Therapy: Patient Spontanous Breathing and Patient connected to face mask oxygen  Post-op Assessment: Report given to PACU RN and Post -op Vital signs reviewed and stable  Post vital signs: Reviewed and stable Filed Vitals:   02/07/11 0641  BP: 162/70  Pulse: 81  Temp: 36.5 C  Resp: 20    Complications: No apparent anesthesia complications

## 2011-02-07 NOTE — Progress Notes (Signed)
Dentures returned to patient per her request

## 2011-02-07 NOTE — Anesthesia Preprocedure Evaluation (Signed)
Anesthesia Evaluation  Patient identified by MRN, date of birth, ID band Patient awake    Reviewed: Allergy & Precautions, H&P , NPO status , Patient's Chart, lab work & pertinent test results  Airway Mallampati: II TM Distance: >3 FB Neck ROM: Full    Dental No notable dental hx.    Pulmonary neg pulmonary ROS,  clear to auscultation  Pulmonary exam normal       Cardiovascular hypertension, + CAD and neg cardio ROS Regular Normal    Neuro/Psych Negative Neurological ROS  Negative Psych ROS   GI/Hepatic negative GI ROS, Neg liver ROS, GERD-  Medicated,  Endo/Other  Negative Endocrine ROSDiabetes mellitus-Hypothyroidism   Renal/GU negative Renal ROS  Genitourinary negative   Musculoskeletal negative musculoskeletal ROS (+)   Abdominal   Peds negative pediatric ROS (+)  Hematology negative hematology ROS (+)   Anesthesia Other Findings   Reproductive/Obstetrics negative OB ROS                           Anesthesia Physical Anesthesia Plan  ASA: III  Anesthesia Plan: MAC   Post-op Pain Management:    Induction: Intravenous  Airway Management Planned: Simple Face Mask  Additional Equipment:   Intra-op Plan:   Post-operative Plan:   Informed Consent: I have reviewed the patients History and Physical, chart, labs and discussed the procedure including the risks, benefits and alternatives for the proposed anesthesia with the patient or authorized representative who has indicated his/her understanding and acceptance.     Plan Discussed with: CRNA  Anesthesia Plan Comments:         Anesthesia Quick Evaluation

## 2011-02-08 ENCOUNTER — Encounter: Payer: Self-pay | Admitting: Physician Assistant

## 2011-02-08 ENCOUNTER — Ambulatory Visit (HOSPITAL_BASED_OUTPATIENT_CLINIC_OR_DEPARTMENT_OTHER): Payer: Medicare Other | Admitting: Physician Assistant

## 2011-02-08 VITALS — BP 154/72 | HR 94 | Temp 98.2°F | Ht 63.0 in | Wt 171.5 lb

## 2011-02-08 DIAGNOSIS — C50919 Malignant neoplasm of unspecified site of unspecified female breast: Secondary | ICD-10-CM

## 2011-02-08 LAB — GLUCOSE, CAPILLARY: Glucose-Capillary: 153 mg/dL — ABNORMAL HIGH (ref 70–99)

## 2011-02-08 MED ORDER — PROCHLORPERAZINE MALEATE 10 MG PO TABS: 10.0000 mg | ORAL_TABLET | Freq: Four times a day (QID) | ORAL | Status: DC | PRN

## 2011-02-08 MED ORDER — TOBRAMYCIN-DEXAMETHASONE 0.3-0.1 % OP SUSP: 1.0000 [drp] | Freq: Two times a day (BID) | OPHTHALMIC | Status: AC

## 2011-02-08 MED ORDER — DEXAMETHASONE 4 MG PO TABS: ORAL_TABLET | ORAL | Status: DC

## 2011-02-08 MED ORDER — ONDANSETRON HCL 8 MG PO TABS: ORAL_TABLET | ORAL | Status: DC

## 2011-02-08 MED ORDER — LIDOCAINE-PRILOCAINE 2.5-2.5 % EX CREA: TOPICAL_CREAM | CUTANEOUS | Status: DC | PRN

## 2011-02-08 NOTE — Progress Notes (Signed)
Hematology and Oncology Follow Up Visit  [name] [id number] Oct 22, 1940 71 y.o. 02/08/2011    HPI: The patient had a screening mammogram July of 2009, which was unremarkable. Screening mammography this year showed a possible mass in the right breast. Right diagnostic mammogram 12/05/2010 confirmed an irregularly marginated oval mass in the right breast at the 6:00 position measuring up to 1.4 cm. On physical exam Dr. [name] was not able to palpate a discrete abnormality, but ultrasound showed an irregularly marginated hypoechoic mass measuring 1.1 cm, and containing microcalcifications.   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 Estrogen receptors were expressed with 100% positivity, same for progesterone receptors, but there was no HER-2 amplification, with the ratio by CISH being 0.96. The MIB-1 was 96%.   With this information the patient underwent bilateral breast MRIs on 12/12/2010, showing a solitary 1.4 cm irregular enhancing mass in the right breast, but no other suspicious masses and no associated adenopathy.   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 Margins were negative. Repeat HER-2 was again negative.  The decision was made to pursue adjuvant chemotherapy, to consist of 4 q. three-week doses of docetaxel and cyclophosphamide, Neulasta to be given on day 2 for granulocyte support.  Interim History:   [name] returns today to review her upcoming treatment planned for her right breast carcinoma. She'll be receiving adjuvant docetaxel and cyclophosphamide every 3 weeks for a total of 4 cycles. She is due for day 1 cycle 1 on Monday, January 21.  Over half of our 45 minute appointment today was spent coordinating care and counseling Bayla regarding  her diagnosis, her upcoming treatment plan, and her antiemetic regimen. She will utilize dexamethasone, ondansetron, and prochlorperazine for her antiemetics.  Lyndsee had her port placed yesterday, with no complications other than a little tenderness today. She has attended chemotherapy class already, and is ready to proceed to chemotherapy next week. Physically she is feeling well with no complaints.  A detailed review of systems is otherwise noncontributory as noted below.  Review of Systems: Constitutional:  no weight loss, fever, night sweats and feels well Eyes: negative [id number] Cardiovascular: no chest pain or dyspnea on exertion Respiratory: no cough, shortness of breath, or wheezing Neurological: no TIA or stroke symptoms negative Dermatological: negative Gastrointestinal: no abdominal pain, change in bowel habits, or black or bloody stools Genito-Urinary: no dysuria, trouble voiding, or hematuria Hematological and Lymphatic: negative Breast: negative Musculoskeletal: negative Remaining ROS negative.   Past Medical History   Diagnosis  Date   .  Elevated cholesterol    .  GERD (gastroesophageal reflux disease)    .  Thyroid disease    .  Hypertension    .  Coronary artery disease      stent 2007   .  Depression    .  Diabetes mellitus    .  Hypothyroidism    .  Cancer      right breast   .  Breast cancer  12/05/10     R breast, inv mammary, in situ    The patient also has a history of coronary artery disease, status post stenting; history of hypothyroidism; history of hypertension; history of GERD; and history of diabetes mellitus.   Past Surgical History   Procedure  Date   .  Cholecystectomy    .  Knee surgery  2007   .  Carotid stent  2007   .  Carpal tunnel release  9/12,8,12     both done   .  Dilation and curettage of uterus    .  Coronary angioplasty    .  Multiple extractions with alveoloplasty    .  Breast lumpectomy  1983     benign biopsy     .  Breast surgery  2012     R lumpectomy, snbx, ER/PR +, Her2 -, 0/1 node pos.    Family History   Problem  Relation  Age of Onset   .  Cancer  Son    .  Cancer  Father       throat    .  Cancer  Brother  21      lymphoma    The patient's father died at the age of 67 from cancer "in his temples". The patient's mother died from renal failure at the age of 68. The patient had 3 brothers, one of whom died at the age of 21 from non-Hodgkin's lymphoma. She has one sister. There is no history of breast or ovarian cancer in the family to her knowledge.   Gynecologic history: GX P3. Menarche age 29, last menstrual period in her mid 35s.She used hormone replacement for about 10 years, discontinuing that in 2002.   Social History: She is a retired Radiation protection practitioner. Her husband [name] is a retired Quarry manager. One daughter died from suicide at the age of 71 in the setting of postpartum depression.A second daughter, [name], works in [place] as a Art therapist. Son Londan Leiferman 36 is a Quarry manager in [place]. The patient has 7 grandchildren. She attends a [employer].   Medications:   I have reviewed the patient's current medications.  Current Outpatient Prescriptions  Medication Sig Dispense Refill  . aspirin 81 MG tablet Take 81 mg by mouth daily before breakfast.       . carvedilol (COREG) 12.5 MG tablet Take 6.25 mg by mouth 2 (two) times daily with a meal.       . cholecalciferol (VITAMIN D) 1000 UNITS tablet Take 1,000 Units by mouth daily.        Marland Kitchen LEVEMIR FLEXPEN 100 UNIT/ML injection Inject 100 Units into the skin daily after breakfast.       . lisinopril (PRINIVIL,ZESTRIL) 40 MG tablet Take 40 mg by mouth daily after breakfast.       . LIVALO 2 MG TABS Take 2 mg by mouth Daily.       . Omega-3 Fatty Acids (FISH OIL) 1200 MG CAPS Take 1 capsule by mouth 2 (two) times daily.        . pantoprazole (PROTONIX) 40 MG tablet Take 40 mg by mouth daily.       Marland Kitchen  PARoxetine (PAXIL) 20 MG tablet Take 20 mg by mouth every morning.       . sitaGLIPtin (JANUVIA) 50 MG tablet Take 50 mg by mouth daily.       Marland Kitchen SYNTHROID 50 MCG tablet Take 50 mcg by mouth daily before breakfast.       . WELCHOL 625 MG tablet Take 1,250 mg by mouth 2 (two) times daily with a meal.       . BD PEN NEEDLE NANO U/F 32G X 4 MM MISC Daily.      Marland Kitchen dexamethasone (DECADRON) 4 MG tablet Take 2 tablets two times a day the day before Taxotere.  Then take 2 tabs two times a day starting the day after chemo for 3 days.  30 tablet  1  . lidocaine-prilocaine (EMLA) cream Apply topically as needed.  30 g  0  . ondansetron (ZOFRAN) 8 MG tablet Take 1 tablet two times a day starting the day after chemo for 3 days. Then take 1 tab two times a day as needed for nausea or vomiting.  30 tablet  1  . prochlorperazine (COMPAZINE) 10 MG tablet Take 1 tablet (10 mg total) by mouth every 6 (six) hours as needed (Nausea or vomiting).  30 tablet  1  . tobramycin-dexamethasone (TOBRADEX) ophthalmic solution Place 1 drop into both eyes 2 (two) times daily.  5 mL  0   No current facility-administered medications for this visit.   Facility-Administered Medications Ordered in Other Visits  Medication Dose Route Frequency Provider Last Rate Last Dose  . DISCONTD: fentaNYL (SUBLIMAZE) injection 25-50 mcg  25-50 mcg Intravenous Q5 min PRN Fara Chute, MD      . DISCONTD: heparin 6,000 Units in sodium chloride irrigation 0.9 % 500 mL irrigation   Irrigation To OR [name], MD      . DISCONTD: insulin aspart (novoLOG) 100 UNIT/ML injection           . DISCONTD: lactated ringers infusion   Intravenous Continuous Kealy Dee Williford, CRNA        Allergies:  Allergies  Allergen Reactions  . Codeine Nausea And Vomiting and Other (See Comments)    Severe stomach cramps  . Antihistamines, Diphenhydramine-Type Other (See Comments)    Causes hyperactivity  . Erythromycin Hives and Rash    Due to dental work  about 50 years ago. Was used in packing and resulted in rash/hives inside and outside of mouth.    Physical Exam: Filed Vitals:   02/08/11 1305  BP: 154/72  Pulse: 94  Temp: 98.2 F (36.8 C)   HEENT:  Sclerae anicteric, conjunctivae pink.  Oropharynx clear.  No mucositis or candidiasis.   Nodes:  No cervical, supraclavicular, or axillary lymphadenopathy palpated.  Breast Exam:  Deferred   Lungs:  Clear to auscultation bilaterally.  No crackles, rhonchi, or wheezes.   Heart:  Regular rate and rhythm.   Abdomen:  Soft, nontender.  Positive bowel sounds.  No organomegaly or masses palpated.   Musculoskeletal:  No focal spinal tenderness to palpation.  Extremities:  Benign.  No peripheral edema or cyanosis.   Skin:  Benign.   Neuro:  Nonfocal.   Lab Results: Lab Results  Component Value Date   WBC 6.7 02/05/2011   HGB 12.6 02/05/2011   HCT 36.9 02/05/2011   MCV 88.5 02/05/2011   PLT 227 02/05/2011   NEUTROABS 2.9 12/20/2010     Chemistry      Component Value Date/Time   NA 136 02/05/2011 1100   K 4.5 02/05/2011 1100   CL 101 02/05/2011 1100   CO2 25 02/05/2011 1100   BUN 17 02/05/2011 1100   CREATININE 0.91 02/05/2011 1100      Component Value Date/Time   CALCIUM 9.3 02/05/2011 1100   ALKPHOS 80 12/20/2010 1213   AST 24 12/20/2010 1213   ALT 25 12/20/2010 1213   BILITOT 0.5 12/20/2010 1213        Radiological Studies:  Portable Chest X-ray 1 View  02/07/2011  *RADIOLOGY REPORT*  Clinical Data: Port-A-Cath placement  PORTABLE CHEST - 1 VIEW  Comparison: Portable exam 1011 hours compared to 12/27/2010  Findings: Left  subclavian Port-A-Cath, tip projecting over SVC. Normal heart size, mediastinal contours, and pulmonary vascularity. Atherosclerotic calcification aorta. Emphysematous changes with minimal bibasilar atelectasis. No definite infiltrate, pleural effusion or pneumothorax.  IMPRESSION: No pneumothorax following left subclavian Port-A-Cath insertion. Emphysematous  changes with minimal bibasilar atelectasis.  Original Report Authenticated By: Burnetta Sabin, M.D.   Dg C-arm 1-60 Min-no Report  02/07/2011  CLINICAL DATA: incerstion portacath   C-ARM 1-60 MINUTES  Fluoroscopy was utilized by the requesting physician.  No radiographic  interpretation.       Assessment:  71 year old [place] woman   (1)  status post right lumpectomy and sentinel lymph node biopsy November of 2012 for a pT1c pN0, Stage I invasive ductal carcinoma, grade 3, strongly estrogen and progesterone receptor positive, HER-2 not amplified, with an MIB-1-1 of 96%. Her Oncotype recurrence score predicts a 14% risk of distant recurrence if her only systemic adjuvant therapy is tamoxifen for five years.  (2)  receiving adjuvant chemotherapy, the plan being to complete 4 q. three-week doses of docetaxel/cyclophosphamide, Neulasta given on day 2 for granulocyte support.   Plan:  [place]a will return next Monday, January 21, for her first dose of docetaxel and cyclophosphamide. As noted above, we did spend quite a bit of time today reviewing her antiemetic regimen which will include dexamethasone, ondansetron, and prochlorperazine. She was given written instructions on how to utilize all of these medications appropriately, and voiced her understanding. She's also given prescriptions for EMLA cream, and TobraDex eyedrops which she will use for 7 days following each dose of chemotherapy.  He is already scheduled for her Neulasta injection on January 22, and I will see her one week later for assessment of chemotoxicity on January 29.  This plan was reviewed with the patient, who voices understanding and agreement.  She knows to call with any changes or problems.    Diella Gillingham, PA-C 02/08/2011

## 2011-02-12 ENCOUNTER — Ambulatory Visit: Payer: Medicare Other | Admitting: Oncology

## 2011-02-12 ENCOUNTER — Other Ambulatory Visit: Payer: Medicare Other

## 2011-02-12 ENCOUNTER — Ambulatory Visit (HOSPITAL_BASED_OUTPATIENT_CLINIC_OR_DEPARTMENT_OTHER): Payer: Medicare Other

## 2011-02-12 VITALS — BP 166/76 | HR 89 | Temp 97.1°F

## 2011-02-12 DIAGNOSIS — C50919 Malignant neoplasm of unspecified site of unspecified female breast: Secondary | ICD-10-CM

## 2011-02-12 DIAGNOSIS — I10 Essential (primary) hypertension: Secondary | ICD-10-CM

## 2011-02-12 DIAGNOSIS — Z170 Estrogen receptor positive status [ER+]: Secondary | ICD-10-CM

## 2011-02-12 DIAGNOSIS — Z5111 Encounter for antineoplastic chemotherapy: Secondary | ICD-10-CM

## 2011-02-12 LAB — COMPREHENSIVE METABOLIC PANEL
ALT: 26 U/L (ref 0–35)
AST: 24 U/L (ref 0–37)
Alkaline Phosphatase: 76 U/L (ref 39–117)
Sodium: 136 mEq/L (ref 135–145)
Total Bilirubin: 0.5 mg/dL (ref 0.3–1.2)
Total Protein: 6.4 g/dL (ref 6.0–8.3)

## 2011-02-12 LAB — CBC WITH DIFFERENTIAL/PLATELET
BASO%: 0.1 % (ref 0.0–2.0)
EOS%: 0 % (ref 0.0–7.0)
HCT: 34.8 % (ref 34.8–46.6)
MCH: 30.3 pg (ref 25.1–34.0)
MCHC: 34.8 g/dL (ref 31.5–36.0)
NEUT%: 81.7 % — ABNORMAL HIGH (ref 38.4–76.8)
RBC: 3.99 10*6/uL (ref 3.70–5.45)
lymph#: 1.7 10*3/uL (ref 0.9–3.3)

## 2011-02-12 MED ORDER — HEPARIN SOD (PORK) LOCK FLUSH 100 UNIT/ML IV SOLN
500.0000 [IU] | Freq: Once | INTRAVENOUS | Status: AC | PRN
  Administered 2011-02-12: 500 [IU]
  Filled 2011-02-12: qty 5

## 2011-02-12 MED ORDER — ONDANSETRON 16 MG/50ML IVPB (CHCC)
16.0000 mg | Freq: Once | INTRAVENOUS | Status: AC
  Administered 2011-02-12: 16 mg via INTRAVENOUS

## 2011-02-12 MED ORDER — SODIUM CHLORIDE 0.9 % IJ SOLN
10.0000 mL | INTRAMUSCULAR | Status: DC | PRN
  Administered 2011-02-12: 10 mL
  Filled 2011-02-12: qty 10

## 2011-02-12 MED ORDER — LORAZEPAM 2 MG/ML IJ SOLN
0.5000 mg | Freq: Once | INTRAMUSCULAR | Status: AC
  Administered 2011-02-12: 0.5 mg via INTRAVENOUS

## 2011-02-12 MED ORDER — DEXAMETHASONE SODIUM PHOSPHATE 4 MG/ML IJ SOLN
20.0000 mg | Freq: Once | INTRAMUSCULAR | Status: AC
  Administered 2011-02-12: 20 mg via INTRAVENOUS

## 2011-02-12 MED ORDER — SODIUM CHLORIDE 0.9 % IV SOLN
Freq: Once | INTRAVENOUS | Status: AC
  Administered 2011-02-12: 11:00:00 via INTRAVENOUS

## 2011-02-12 MED ORDER — DOCETAXEL CHEMO INJECTION 160 MG/16ML
75.0000 mg/m2 | Freq: Once | INTRAVENOUS | Status: AC
  Administered 2011-02-12: 140 mg via INTRAVENOUS
  Filled 2011-02-12: qty 14

## 2011-02-12 MED ORDER — FUROSEMIDE 10 MG/ML IJ SOLN
20.0000 mg | Freq: Once | INTRAMUSCULAR | Status: AC
  Administered 2011-02-12: 20 mg via INTRAVENOUS

## 2011-02-12 MED ORDER — SODIUM CHLORIDE 0.9 % IV SOLN
600.0000 mg/m2 | Freq: Once | INTRAVENOUS | Status: AC
  Administered 2011-02-12: 1100 mg via INTRAVENOUS
  Filled 2011-02-12: qty 55

## 2011-02-12 MED ORDER — FUROSEMIDE 10 MG/ML IJ SOLN: 40.0000 mg | Freq: Once | INTRAMUSCULAR | Status: DC

## 2011-02-12 NOTE — Progress Notes (Signed)
1045 am s/w Dr Jana Hakim re hypertension and pt anxiety.  Received order for ativan and lasix. Port accessed and ativan give. BP down to 158/80. S/w Dr Jana Hakim again and he said to hold on giving the lasix. [place] s/w Dr Jana Hakim hypertension during taxotere infusion. Order for lasix received and lasix given. 1405 BP 166/76 hr 89 resp 20.

## 2011-02-13 ENCOUNTER — Ambulatory Visit (HOSPITAL_BASED_OUTPATIENT_CLINIC_OR_DEPARTMENT_OTHER): Payer: Medicare Other

## 2011-02-13 ENCOUNTER — Encounter (HOSPITAL_COMMUNITY): Payer: Self-pay | Admitting: General Surgery

## 2011-02-13 ENCOUNTER — Telehealth: Payer: Self-pay | Admitting: *Deleted

## 2011-02-13 VITALS — BP 164/82 | HR 76 | Temp 97.3°F

## 2011-02-13 DIAGNOSIS — C50919 Malignant neoplasm of unspecified site of unspecified female breast: Secondary | ICD-10-CM

## 2011-02-13 MED ORDER — PEGFILGRASTIM INJECTION 6 MG/0.6ML
6.0000 mg | Freq: Once | SUBCUTANEOUS | Status: AC
  Administered 2011-02-13: 6 mg via SUBCUTANEOUS
  Filled 2011-02-13: qty 0.6

## 2011-02-13 NOTE — Telephone Encounter (Signed)
[name] says she is doing fairly well.  Describes herself as feeling shaky, dragging and has not yet had a BM today.  Reports tomorrow she will take something if no BM today.  Her normal is to have a daily BM.  Denies questions.  Encouraged to drink lots of water, hot beverages and to walk to facilitate bowels.

## 2011-02-13 NOTE — Telephone Encounter (Signed)
Message copied by Cherylynn Ridges on Tue Feb 13, 2011 11:21 AM ------      Message from: Janace Hoard      Created: Mon Feb 12, 2011  6:18 PM      Regarding: chemo f/u call       taxotere and cytoxan, Dr Jana Hakim, was very nervous and had hypertension. Pt to f/u w/Dr Johnsie Cancel.

## 2011-02-17 ENCOUNTER — Telehealth: Payer: Self-pay | Admitting: Oncology

## 2011-02-17 NOTE — Telephone Encounter (Signed)
On call: patient called as she is extremely fatigued/sleepy today. Had first taxotere/cytoxan (q3wk) on 02-12-11 and used decadron beginning 02-11-11  x5 days. She has taken no antimetics today,has taken only usual meds,  no fever, BP checked during call 125/62, is ambulatory, has been eating and will drink more fluids, family with her --  Is just very tired and wants to sleep. Likely fatigue is coming off steroids, counts may also be dropping tho very good prior to Rx. Told her to drink more fluids and ok to nap. She will call if other problems and keep appt on 1-29.

## 2011-02-19 ENCOUNTER — Emergency Department (HOSPITAL_COMMUNITY)
Admission: EM | Admit: 2011-02-19 | Discharge: 2011-02-19 | Disposition: A | Discharge status: Home / Self Care | Payer: Medicare Other | Attending: Emergency Medicine | Admitting: Emergency Medicine

## 2011-02-19 ENCOUNTER — Encounter (HOSPITAL_COMMUNITY): Payer: Self-pay | Admitting: Emergency Medicine

## 2011-02-19 DIAGNOSIS — R110 Nausea: Secondary | ICD-10-CM | POA: Insufficient documentation

## 2011-02-19 DIAGNOSIS — R000 Tachycardia, unspecified: Secondary | ICD-10-CM | POA: Insufficient documentation

## 2011-02-19 DIAGNOSIS — E039 Hypothyroidism, unspecified: Secondary | ICD-10-CM | POA: Insufficient documentation

## 2011-02-19 DIAGNOSIS — R197 Diarrhea, unspecified: Secondary | ICD-10-CM | POA: Insufficient documentation

## 2011-02-19 DIAGNOSIS — C50919 Malignant neoplasm of unspecified site of unspecified female breast: Secondary | ICD-10-CM | POA: Insufficient documentation

## 2011-02-19 DIAGNOSIS — I2510 Atherosclerotic heart disease of native coronary artery without angina pectoris: Secondary | ICD-10-CM | POA: Insufficient documentation

## 2011-02-19 DIAGNOSIS — E119 Type 2 diabetes mellitus without complications: Secondary | ICD-10-CM | POA: Insufficient documentation

## 2011-02-19 DIAGNOSIS — T451X5A Adverse effect of antineoplastic and immunosuppressive drugs, initial encounter: Secondary | ICD-10-CM | POA: Insufficient documentation

## 2011-02-19 DIAGNOSIS — I10 Essential (primary) hypertension: Secondary | ICD-10-CM | POA: Insufficient documentation

## 2011-02-19 LAB — COMPREHENSIVE METABOLIC PANEL
Albumin: 2.8 g/dL — ABNORMAL LOW (ref 3.5–5.2)
BUN: 19 mg/dL (ref 6–23)
Chloride: 95 mEq/L — ABNORMAL LOW (ref 96–112)
Creatinine, Ser: 0.91 mg/dL (ref 0.50–1.10)
Total Bilirubin: 0.5 mg/dL (ref 0.3–1.2)

## 2011-02-19 LAB — CBC
MCH: 30.9 pg (ref 26.0–34.0)
MCV: 86.2 fL (ref 78.0–100.0)
Platelets: 116 10*3/uL — ABNORMAL LOW (ref 150–400)
RDW: 12.2 % (ref 11.5–15.5)

## 2011-02-19 LAB — DIFFERENTIAL
Basophils Absolute: 0 10*3/uL (ref 0.0–0.1)
Basophils Relative: 1 % (ref 0–1)
Eosinophils Relative: 1 % (ref 0–5)
Lymphocytes Relative: 37 % (ref 12–46)
Monocytes Absolute: 0.9 10*3/uL (ref 0.1–1.0)

## 2011-02-19 LAB — URINALYSIS, ROUTINE W REFLEX MICROSCOPIC
Glucose, UA: 250 mg/dL — AB
Leukocytes, UA: NEGATIVE
Nitrite: NEGATIVE
Protein, ur: NEGATIVE mg/dL
Urobilinogen, UA: 0.2 mg/dL (ref 0.0–1.0)

## 2011-02-19 LAB — GLUCOSE, CAPILLARY: Glucose-Capillary: 274 mg/dL — ABNORMAL HIGH (ref 70–99)

## 2011-02-19 MED ORDER — SODIUM CHLORIDE 0.9 % IV BOLUS (SEPSIS)
1000.0000 mL | Freq: Once | INTRAVENOUS | Status: AC
  Administered 2011-02-19: 1000 mL via INTRAVENOUS

## 2011-02-19 MED ORDER — ONDANSETRON 8 MG PO TBDP: 8.0000 mg | ORAL_TABLET | Freq: Three times a day (TID) | ORAL | Status: AC | PRN

## 2011-02-19 MED ORDER — LOPERAMIDE HCL 2 MG PO CAPS: 2.0000 mg | ORAL_CAPSULE | Freq: Four times a day (QID) | ORAL | Status: AC | PRN

## 2011-02-19 MED ORDER — LOPERAMIDE HCL 2 MG PO CAPS
2.0000 mg | ORAL_CAPSULE | Freq: Once | ORAL | Status: AC
  Administered 2011-02-19: 2 mg via ORAL
  Filled 2011-02-19: qty 1

## 2011-02-19 MED ORDER — ONDANSETRON HCL 4 MG/2ML IJ SOLN
4.0000 mg | Freq: Once | INTRAMUSCULAR | Status: AC
  Administered 2011-02-19: 4 mg via INTRAVENOUS
  Filled 2011-02-19: qty 2

## 2011-02-19 MED ORDER — METOCLOPRAMIDE HCL 5 MG/ML IJ SOLN
10.0000 mg | Freq: Once | INTRAMUSCULAR | Status: AC
  Administered 2011-02-19: 10 mg via INTRAVENOUS
  Filled 2011-02-19: qty 2

## 2011-02-19 NOTE — ED Notes (Signed)
Pt presented to the Er with c/o of generalized sickness, pt Ca pt that had chemo on Monday, pt states that after treatment she felt sick and that s/s are worsening, pt at this time reports nausea, no vomiting, states have diarrhea, green.

## 2011-02-19 NOTE — ED Provider Notes (Signed)
History     CSN: [id number]  Arrival date & time 02/19/11  0120   First MD Initiated Contact with Patient 02/19/11 0147      Chief Complaint  Patient presents with  . feeling sick from chemo treatment   . Nausea  . Generalized Body Aches  . Breast Cancer    (Consider location/radiation/quality/duration/timing/severity/associated sxs/prior treatment) HPI Patient presents emergent complaints of weakness, nausea and diarrhea. Patient had her first chemotherapy treatment for breast cancer on Monday. Initially she states she felt all right but then started feeling sick on Friday. Patient states today she's had multiple episodes of green diarrhea. Approximately 6-8 episodes. She has had nausea but no vomiting. Patient denies any pain. She states overall she feels very weak and awful. Nothing seems to be making it better. Past Medical History  Diagnosis Date  . Elevated cholesterol   . GERD (gastroesophageal reflux disease)   . Thyroid disease   . Hypertension   . Coronary artery disease     stent 2007  . Depression   . Diabetes mellitus   . Hypothyroidism   . Cancer     right breast  . 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 Arthritis     Past Surgical History  Procedure Date  . Cholecystectomy   . Knee surgery 2007  . Carotid stent 2007  . Carpal tunnel release 9/12,8,12    both done  . Dilation and curettage of uterus   . Coronary angioplasty   . Multiple extractions with alveoloplasty   . Foot spurs REMOVED  . Breast lumpectomy 1983    benign biopsy  . Breast surgery 2012    R lumpectomy, snbx, ER/PR +, Her2 -, 0/1 node pos.  . Portacath placement 02/07/2011    Procedure: INSERTION PORT-A-CATH;  Surgeon: [name], MD;  Location: [facility];  Service: General;  Laterality: N/A;    Family History  Problem Relation Age of Onset  . Cancer Son   . Cancer Father     throat  . Cancer Brother 40    lymphoma    History  Substance Use  Topics  . Smoking status: Former Smoker -- 2.0 packs/day for 40 years    Quit date: 12/19/1997  . Smokeless tobacco: Former Systems developer    Quit date: 01/22/1997  . Alcohol Use: 3.6 oz/week    6 Glasses of wine per week     occassional    OB History    Grav Para Term Preterm Abortions TAB SAB Ect Mult Living                  Review of Systems  Respiratory: Negative for chest tightness.   Gastrointestinal: Negative for abdominal pain.  All other systems reviewed and are negative.    Allergies  Codeine; Antihistamines, diphenhydramine-type; and Erythromycin  Home Medications   Current Outpatient Rx  Name Route Sig Dispense Refill  . ASPIRIN 81 MG PO TABS Oral Take 81 mg by mouth daily before breakfast.     . CARVEDILOL 12.5 MG PO TABS Oral Take 6.25 mg by mouth 2 (two) times daily with a meal.     . VITAMIN D 1000 UNITS PO TABS Oral Take 1,000 Units by mouth daily.      Marland Kitchen DEXAMETHASONE 4 MG PO TABS  Take 2 tablets two times a day the day before Taxotere. Then take 2 tabs two times a day starting the day after chemo for 3  days. 30 tablet 1  . LEVEMIR FLEXPEN 100 UNIT/ML [place] SOLN Subcutaneous Inject 100 Units into the skin daily after breakfast.     . LIDOCAINE-PRILOCAINE 2.5-2.5 % EX CREA Topical Apply topically as needed. 30 g 0  . LISINOPRIL 40 MG PO TABS Oral Take 40 mg by mouth daily after breakfast.     . LIVALO 2 MG PO TABS Oral Take 2 mg by mouth Daily.     Marland Kitchen FISH OIL 1200 MG PO CAPS Oral Take 1 capsule by mouth 2 (two) times daily.      Marland Kitchen ONDANSETRON HCL 8 MG PO TABS  Take 1 tablet two times a day starting the day after chemo for 3 days. Then take 1 tab two times a day as needed for nausea or vomiting. 30 tablet 1  . PANTOPRAZOLE SODIUM 40 MG PO TBEC Oral Take 40 mg by mouth daily.     Marland Kitchen PAROXETINE HCL 20 MG PO TABS Oral Take 20 mg by mouth every morning.     Marland Kitchen PROCHLORPERAZINE MALEATE 10 MG PO TABS Oral Take 1 tablet (10 mg total) by mouth every 6 (six) hours as needed (Nausea  or vomiting). 30 tablet 1  . SITAGLIPTIN PHOSPHATE 50 MG PO TABS Oral Take 50 mg by mouth daily.     Marland Kitchen SYNTHROID 50 MCG PO TABS Oral Take 50 mcg by mouth daily before breakfast.     . TOBRAMYCIN-DEXAMETHASONE 0.3-0.1 % OP SUSP Both Eyes Place 1 drop into both eyes 2 (two) times daily. 5 mL 0  . WELCHOL 625 MG PO TABS Oral Take 1,250 mg by mouth 2 (two) times daily with a meal.       BP 138/56  Pulse 122  Resp 20  Wt 165 lb (74.844 kg)  SpO2 95%  Physical Exam  Nursing note and vitals reviewed. Constitutional: She appears well-developed and well-nourished. She appears ill.  HENT:  Head: Normocephalic and atraumatic.  Right Ear: External ear normal.  Left Ear: External ear normal.  Mouth/Throat: Oropharyngeal exudate: mucous membranes are dry.  Eyes: Conjunctivae are normal. Right eye exhibits no discharge. Left eye exhibits no discharge. No scleral icterus.  Neck: Neck supple. No tracheal deviation present.  Cardiovascular: Regular rhythm and intact distal pulses.  Tachycardia present.   Pulmonary/Chest: Effort normal and breath sounds normal. No stridor. No respiratory distress. She has no wheezes. She has no rales.  Abdominal: Soft. Bowel sounds are normal. She exhibits no distension. There is no tenderness. There is no rebound and no guarding.  Musculoskeletal: She exhibits no edema and no tenderness.  Neurological: She is alert. She has normal strength. No sensory deficit. Cranial nerve deficit:  no gross defecits noted. She exhibits normal muscle tone. She displays no seizure activity. Coordination normal.  Skin: Skin is warm and dry. No rash noted.  Psychiatric: She has a normal mood and affect.    ED Course  Procedures (including critical care time)  Labs Reviewed  CBC - Abnormal; Notable for the following:    Platelets 116 (*) LARGE PLATELETS PRESENT   All other components within normal limits  DIFFERENTIAL - Abnormal; Notable for the following:    Neutrophils Relative  39 (*)    Neutro Abs 1.5 (*)    Monocytes Relative 22 (*)    All other components within normal limits  COMPREHENSIVE METABOLIC PANEL - Abnormal; Notable for the following:    Sodium 129 (*)    Chloride 95 (*)    Glucose, Bld 282 (*)  Total Protein 5.9 (*)    Albumin 2.8 (*)    GFR calc non Af Amer 63 (*)    GFR calc Af Amer 73 (*)    All other components within normal limits  URINALYSIS, ROUTINE W REFLEX MICROSCOPIC - Abnormal; Notable for the following:    Glucose, UA 250 (*)    All other components within normal limits  GLUCOSE, CAPILLARY - Abnormal; Notable for the following:    Glucose-Capillary 274 (*)    All other components within normal limits   No results found.   1. Diarrhea   2. Chemotherapy-induced nausea       MDM  Patient appears to be having vomiting and diarrhea associated with recent chemotherapy. Her lab results to show mild hyponatremia but otherwise she has no significant signs of dehydration. Patient has not had any vomiting here in the emergency room although has had nausea. It has improved somewhat with anti-nausea medications.  She has tolerated oral fluids here in the emergency department. She'll be discharged home with oral antibiotics and antidiarrheal agents she's been instructed return to emergency room for any worsening symptoms fever.        Kathalene Frames, MD 02/19/11 [phone]

## 2011-02-19 NOTE — ED Notes (Signed)
Pt unable to void.  Will try again.

## 2011-02-20 ENCOUNTER — Encounter: Payer: Self-pay | Admitting: Physician Assistant

## 2011-02-20 ENCOUNTER — Other Ambulatory Visit (HOSPITAL_BASED_OUTPATIENT_CLINIC_OR_DEPARTMENT_OTHER): Payer: Medicare Other | Admitting: Lab

## 2011-02-20 ENCOUNTER — Ambulatory Visit (HOSPITAL_BASED_OUTPATIENT_CLINIC_OR_DEPARTMENT_OTHER): Payer: Medicare Other | Admitting: Physician Assistant

## 2011-02-20 ENCOUNTER — Telehealth: Payer: Self-pay | Admitting: Oncology

## 2011-02-20 VITALS — BP 94/62 | HR 111 | Temp 97.7°F | Ht 63.0 in | Wt 162.2 lb

## 2011-02-20 DIAGNOSIS — F411 Generalized anxiety disorder: Secondary | ICD-10-CM

## 2011-02-20 DIAGNOSIS — C50919 Malignant neoplasm of unspecified site of unspecified female breast: Secondary | ICD-10-CM

## 2011-02-20 DIAGNOSIS — R197 Diarrhea, unspecified: Secondary | ICD-10-CM

## 2011-02-20 DIAGNOSIS — F419 Anxiety disorder, unspecified: Secondary | ICD-10-CM

## 2011-02-20 DIAGNOSIS — R110 Nausea: Secondary | ICD-10-CM

## 2011-02-20 LAB — CBC WITH DIFFERENTIAL/PLATELET
EOS%: 0.2 % (ref 0.0–7.0)
MCH: 30.9 pg (ref 25.1–34.0)
MCHC: 34.5 g/dL (ref 31.5–36.0)
MCV: 89.5 fL (ref 79.5–101.0)
MONO%: 0.3 % (ref 0.0–14.0)
NEUT#: 15.5 10*3/uL — ABNORMAL HIGH (ref 1.5–6.5)
RBC: 4.2 10*6/uL (ref 3.70–5.45)
RDW: 12.6 % (ref 11.2–14.5)

## 2011-02-20 MED ORDER — CHOLESTYRAMINE 4 G PO PACK: PACK | ORAL | Status: DC

## 2011-02-20 MED ORDER — LORAZEPAM 0.5 MG PO TABS: 0.5000 mg | ORAL_TABLET | Freq: Two times a day (BID) | ORAL | Status: DC | PRN

## 2011-02-20 NOTE — Telephone Encounter (Signed)
S/w the pt regarding her 02/26/2011 appts

## 2011-02-20 NOTE — Progress Notes (Signed)
Hematology and Oncology Follow Up Visit  [name] [id number] 1941-01-03 71 y.o. 02/20/2011    HPI: The patient had a screening mammogram July of 2009, which was unremarkable. Screening mammography this year showed a possible mass in the right breast. Right diagnostic mammogram 12/05/2010 confirmed an irregularly marginated oval mass in the right breast at the 6:00 position measuring up to 1.4 cm. On physical exam Dr. [name] was not able to palpate a discrete abnormality, but ultrasound showed an irregularly marginated hypoechoic mass measuring 1.1 cm, and containing microcalcifications.   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 Estrogen receptors were expressed with 100% positivity, same for progesterone receptors, but there was no HER-2 amplification, with the ratio by CISH being 0.96. The MIB-1 was 96%.   With this information the patient underwent bilateral breast MRIs on 12/12/2010, showing a solitary 1.4 cm irregular enhancing mass in the right breast, but no other suspicious masses and no associated adenopathy.   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 Margins were negative. Repeat HER-2 was again negative.  The decision was made to pursue adjuvant chemotherapy, to consist of 4 q. three-week doses of docetaxel and cyclophosphamide, Neulasta to be given on day 2 for granulocyte support.  Interim History:   [name] returns today accompanied by her husband, [name], for assessment chemotoxicity on day 9 cycle 1 of 4 planned q. three-week doses of docetaxel/cyclophosphamide, given in the adjuvant setting for right breast carcinoma. She received Neulasta on day 2 for granulocyte support.  [name] is feeling poorly today. In fact she reported to the emergency  room yesterday with nausea and diarrhea, in addition to weakness and fatigue. She received IV fluids, and antinausea medications which seem to help. She was also started on Imodium. She continues to have loose stools, despite Imodium, although these have improved slightly since just today. She describes this as "growing diarrhea" and tells me it has been very watery. She has had some abdominal cramping, but denies any fevers, chills, or night sweats. Although she has had nausea, she denies any actual emesis. The nausea has improved. She is having some bony aches and pains associated with Neulasta injection, especially in the hips.  [name] actually tolerated the treatment itself well, and felt well for the first 3 days while she was taking her anti-emetic medications. When she discontinued the dexamethasone, she has been increasingly weak, increasingly anxious, and is also had a difficult time sleeping although she is extremely fatigued. She is a known diabetic, which is followed by her primary care physician, and her blood sugars have been a little elevated. She is scheduled to meet with him, however next week on February 7, to review her diabetic medications.  I will note that fortunately the patient has had no signs of peripheral neuropathy. No mouth ulcers or oral sensitivity. No problems swallowing. No skin changes, no bed changes, or nail bed sensitivity.  Over half of our 45 minute appointment today was spent coordinating care and counseling [name] regarding her diagnosis, her upcoming treatment plan, and her antiemetic regimen. Our plan, with changes in her regimen, or all detailed below.  A detailed review of systems is otherwise noncontributory as noted below.  Review of Systems: Constitutional:  Fatigue and general weakness, no weight loss, fever, night sweats Eyes: negative [id number] Cardiovascular: no  chest pain or dyspnea on exertion Respiratory: no cough, shortness of breath, or  wheezing Neurological: no TIA or stroke symptoms negative Dermatological: negative Gastrointestinal: positive for diarrhea and nausea, mild abdominal cramping;no emesis or black or bloody stools Genito-Urinary: no dysuria, trouble voiding, or hematuria Hematological and Lymphatic: negative Breast: negative Musculoskeletal: bone pain - diffuse Remaining ROS negative.   Past Medical History   Diagnosis  Date   .  Elevated cholesterol    .  GERD (gastroesophageal reflux disease)    .  Thyroid disease    .  Hypertension    .  Coronary artery disease      stent 2007   .  Depression    .  Diabetes mellitus    .  Hypothyroidism    .  Cancer      right breast   .  Breast cancer  12/05/10     R breast, inv mammary, in situ    The patient also has a history of coronary artery disease, status post stenting; history of hypothyroidism; history of hypertension; history of GERD; and history of diabetes mellitus.   Past Surgical History   Procedure  Date   .  Cholecystectomy    .  Knee surgery  2007   .  Carotid stent  2007   .  Carpal tunnel release  9/12,8,12     both done   .  Dilation and curettage of uterus    .  Coronary angioplasty    .  Multiple extractions with alveoloplasty    .  Breast lumpectomy  1983     benign biopsy   .  Breast surgery  2012     R lumpectomy, snbx, ER/PR +, Her2 -, 0/1 node pos.    Family History   Problem  Relation  Age of Onset   .  Cancer  Son    .  Cancer  Father       throat    .  Cancer  Brother  45      lymphoma    The patient's father died at the age of 33 from cancer "in his temples". The patient's mother died from renal failure at the age of 68. The patient had 3 brothers, one of whom died at the age of 69 from non-Hodgkin's lymphoma. She has one sister. There is no history of breast or ovarian cancer in the family to her knowledge.   Gynecologic history: GX P3. Menarche age 47, last menstrual period in her mid 80s.She used hormone  replacement for about 10 years, discontinuing that in 2002.   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 One daughter died from suicide at the age of 52 in the setting of postpartum depression.A second daughter, [name], works in [place] as a Art therapist. Son [name] 36 is a Quarry manager in [place]. The patient has 7 grandchildren. She attends a [employer].   Medications:   I have reviewed the patient's current medications.  Current Outpatient Prescriptions  Medication Sig Dispense Refill  . BD PEN NEEDLE NANO U/F 32G X 4 MM MISC       . tobramycin-dexamethasone (TOBRADEX) ophthalmic solution       . aspirin 81 MG tablet Take 81 mg by mouth daily before breakfast.       . carvedilol (COREG) 12.5 MG tablet Take 6.25 mg by mouth 2 (two) times daily with a meal.       .  cholecalciferol (VITAMIN D) 1000 UNITS tablet Take 1,000 Units by mouth daily.        . cholestyramine (QUESTRAN) 4 G packet 1 packet PO BID, dissolved in beverage and taken with meals, prn diarrhea  30 each  1  . dexamethasone (DECADRON) 4 MG tablet Take 2 tablets two times a day the day before Taxotere. Then take 2 tabs two times a day starting the day after chemo for 3 days.  30 tablet  1  . LEVEMIR FLEXPEN 100 UNIT/ML injection Inject 60-100 Units into the skin daily after breakfast. 100 in the am and 60 at night      . lidocaine-prilocaine (EMLA) cream Apply topically as needed.  30 g  0  . lisinopril (PRINIVIL,ZESTRIL) 40 MG tablet Take 40 mg by mouth daily after breakfast.       . LIVALO 2 MG TABS Take 2 mg by mouth Daily.       Marland Kitchen loperamide (IMODIUM) 2 MG capsule Take 1 capsule (2 mg total) by mouth 4 (four) times daily as needed for diarrhea or loose stools.  12 capsule  0  . LORazepam (ATIVAN) 0.5 MG tablet Take 1 tablet (0.5 mg total) by mouth 2 (two) times daily as needed (anxiety and/or nausea).  30 tablet  0  . Omega-3 Fatty Acids  (FISH OIL) 1200 MG CAPS Take 1 capsule by mouth 2 (two) times daily.        . ondansetron (ZOFRAN ODT) 8 MG disintegrating tablet Take 1 tablet (8 mg total) by mouth every 8 (eight) hours as needed for nausea.  20 tablet  0  . ondansetron (ZOFRAN) 8 MG tablet Take 1 tablet two times a day starting the day after chemo for 3 days. Then take 1 tab two times a day as needed for nausea or vomiting.  30 tablet  1  . pantoprazole (PROTONIX) 40 MG tablet Take 40 mg by mouth daily.       Marland Kitchen PARoxetine (PAXIL) 20 MG tablet Take 20 mg by mouth every morning.       . prochlorperazine (COMPAZINE) 10 MG tablet Take 1 tablet (10 mg total) by mouth every 6 (six) hours as needed (Nausea or vomiting).  30 tablet  1  . sitaGLIPtin (JANUVIA) 50 MG tablet Take 50 mg by mouth daily.       . sodium chloride 0.9 % SOLN Inject into the vein. Chemo on Monday thru [facility]. Does not know what she recieved      . SYNTHROID 50 MCG tablet Take 50 mcg by mouth daily before breakfast.       . WELCHOL 625 MG tablet Take 1,250 mg by mouth 2 (two) times daily with a meal.         Allergies:  Allergies  Allergen Reactions  . Codeine Nausea And Vomiting and Other (See Comments)    Severe stomach cramps  . Antihistamines, Diphenhydramine-Type Other (See Comments)    Causes hyperactivity  . Erythromycin Hives and Rash    Due to dental work about 50 years ago. Was used in packing and resulted in rash/hives inside and outside of mouth.    Physical Exam: Filed Vitals:   02/20/11 0849  BP: 94/62  Pulse: 111  Temp: 97.7 F (36.5 C)   HEENT:  Sclerae anicteric, conjunctivae pink.  Oropharynx clear.  No mucositis or candidiasis.   Nodes:  No cervical, supraclavicular, or axillary lymphadenopathy palpated.  Breast Exam:  Deferred   Lungs:  Clear  to auscultation bilaterally.  No crackles, rhonchi, or wheezes.   Heart:  Regular rate and rhythm.   Abdomen:  Soft, nontender.  Positive bowel sounds.  No organomegaly  or masses palpated.   Musculoskeletal:  No focal spinal tenderness to palpation.  Extremities:  Benign.  No peripheral edema or cyanosis.   Skin:  Benign.   Neuro:  Nonfocal.   Lab Results: Lab Results  Component Value Date   WBC 17.6* 02/20/2011   HGB 13.0 02/20/2011   HCT 37.5 02/20/2011   MCV 89.5 02/20/2011   PLT 162 02/20/2011   NEUTROABS 15.5* 02/20/2011     Chemistry      Component Value Date/Time   NA 129* 02/19/2011 0224   K 4.5 02/19/2011 0224   CL 95* 02/19/2011 0224   CO2 22 02/19/2011 0224   BUN 19 02/19/2011 0224   CREATININE 0.91 02/19/2011 0224      Component Value Date/Time   CALCIUM 8.8 02/19/2011 0224   ALKPHOS 75 02/19/2011 0224   AST 17 02/19/2011 0224   ALT 19 02/19/2011 0224   BILITOT 0.5 02/19/2011 0224        Radiological Studies:  Portable Chest X-ray 1 View  02/07/2011  *RADIOLOGY REPORT*  Clinical Data: Port-A-Cath placement  PORTABLE CHEST - 1 VIEW  Comparison: Portable exam 1011 hours compared to 12/27/2010  Findings: Left subclavian Port-A-Cath, tip projecting over SVC. Normal heart size, mediastinal contours, and pulmonary vascularity. Atherosclerotic calcification aorta. Emphysematous changes with minimal bibasilar atelectasis. No definite infiltrate, pleural effusion or pneumothorax.  IMPRESSION: No pneumothorax following left subclavian Port-A-Cath insertion. Emphysematous changes with minimal bibasilar atelectasis.  Original Report Authenticated By: Burnetta Sabin, M.D.    Assessment:  71 year old [place] woman   (1)  status post right lumpectomy and sentinel lymph node biopsy November of 2012 for a pT1c pN0, Stage I invasive ductal carcinoma, grade 3, strongly estrogen and progesterone receptor positive, HER-2 not amplified, with an MIB-1-1 of 96%. Her Oncotype recurrence score predicts a 14% risk of distant recurrence if her only systemic adjuvant therapy is tamoxifen for five years.  (2)  receiving adjuvant chemotherapy, the plan being to  complete 4 q. three-week doses of docetaxel/cyclophosphamide, Neulasta given on day 2 for granulocyte support.   Plan:  Beginning with cycle 2, we will plan on tapering take yeast dexamethasone over several days, and perhaps this will give her better nausea control, and keep her from having the "crash" after stopping the steroids. She's also started on lorazepam, 0.5 mg up to twice daily, but especially to take at night to help her sleep. This also helped with her anxiety, as well as mild nausea.  With regards to the diarrhea, I'm sending a stool culture with C. differential toxin for further evaluation, although I'm not overly suspicious that this is the case. We will start her on Questran in place of the Imodium however. She is to call us if the diarrhea does not stop within the next 24 hours. Also encouraged her to keep herself very well hydrated in the meanwhile.  He will return next week for repeat labs and followup visit, and we will review her anti-nausea regimen once again. She is already scheduled for her second dose of chemotherapy on February 11.  This plan was reviewed with the patient, who voices understanding and agreement.  She knows to call with any changes or problems.    Raymon Schlarb, PA-C 02/20/2011

## 2011-02-22 ENCOUNTER — Encounter (INDEPENDENT_AMBULATORY_CARE_PROVIDER_SITE_OTHER): Payer: Medicare Other | Admitting: General Surgery

## 2011-02-26 ENCOUNTER — Encounter: Payer: Self-pay | Admitting: Physician Assistant

## 2011-02-26 ENCOUNTER — Other Ambulatory Visit: Payer: Medicare Other | Admitting: Lab

## 2011-02-26 ENCOUNTER — Ambulatory Visit (HOSPITAL_BASED_OUTPATIENT_CLINIC_OR_DEPARTMENT_OTHER): Payer: Medicare Other | Admitting: Physician Assistant

## 2011-02-26 ENCOUNTER — Ambulatory Visit: Payer: Medicare Other | Admitting: Physician Assistant

## 2011-02-26 ENCOUNTER — Telehealth: Payer: Self-pay | Admitting: Oncology

## 2011-02-26 VITALS — BP 130/71 | HR 99 | Temp 98.0°F | Ht 63.0 in | Wt 166.1 lb

## 2011-02-26 DIAGNOSIS — R197 Diarrhea, unspecified: Secondary | ICD-10-CM

## 2011-02-26 DIAGNOSIS — C50519 Malignant neoplasm of lower-outer quadrant of unspecified female breast: Secondary | ICD-10-CM

## 2011-02-26 DIAGNOSIS — C50919 Malignant neoplasm of unspecified site of unspecified female breast: Secondary | ICD-10-CM

## 2011-02-26 LAB — COMPREHENSIVE METABOLIC PANEL
ALT: 21 U/L (ref 0–35)
AST: 16 U/L (ref 0–37)
Alkaline Phosphatase: 120 U/L — ABNORMAL HIGH (ref 39–117)
Calcium: 9 mg/dL (ref 8.4–10.5)
Chloride: 104 mEq/L (ref 96–112)
Creatinine, Ser: 0.89 mg/dL (ref 0.50–1.10)
Potassium: 4.6 mEq/L (ref 3.5–5.3)

## 2011-02-26 LAB — CBC WITH DIFFERENTIAL/PLATELET
BASO%: 0.3 % (ref 0.0–2.0)
EOS%: 0.5 % (ref 0.0–7.0)
MCH: 30.9 pg (ref 25.1–34.0)
MCHC: 34.4 g/dL (ref 31.5–36.0)
MCV: 89.9 fL (ref 79.5–101.0)
MONO%: 4.6 % (ref 0.0–14.0)
NEUT%: 71.3 % (ref 38.4–76.8)
RDW: 12.7 % (ref 11.2–14.5)
lymph#: 1.8 10*3/uL (ref 0.9–3.3)

## 2011-02-26 LAB — STOOL CULTURE

## 2011-02-26 NOTE — Progress Notes (Signed)
Hematology and Oncology Follow Up Visit  [name] [id number] 1940/04/13 71 y.o. 02/26/2011    HPI: The patient had a screening mammogram July of 2009, which was unremarkable. Screening mammography this year showed a possible mass in the right breast. Right diagnostic mammogram 12/05/2010 confirmed an irregularly marginated oval mass in the right breast at the 6:00 position measuring up to 1.4 cm. On physical exam Dr. [name] was not able to palpate a discrete abnormality, but ultrasound showed an irregularly marginated hypoechoic mass measuring 1.1 cm, and containing microcalcifications.   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 Estrogen receptors were expressed with 100% positivity, same for progesterone receptors, but there was no HER-2 amplification, with the ratio by CISH being 0.96. The MIB-1 was 96%.   With this information the patient underwent bilateral breast MRIs on 12/12/2010, showing a solitary 1.4 cm irregular enhancing mass in the right breast, but no other suspicious masses and no associated adenopathy.   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 Margins were negative. Repeat HER-2 was again negative.  The decision was made to pursue adjuvant chemotherapy, to consist of 4 q. three-week doses of docetaxel and cyclophosphamide, Neulasta to be given on day 2 for granulocyte support.  Interim History:   Faustine returns today accompanied by her husband, [name], for assessment chemotoxicity on day15 cycle 1 of 4 planned q. three-week doses of docetaxel/cyclophosphamide, given in the adjuvant setting for right breast carcinoma. She received Neulasta on day 2 for granulocyte support.    When evaluated here on day 9 cycle 1, he had developed severe nausea  and diarrhea in addition to significant weakness and fatigue. Fortunately a stool culture and C. difficile toxin were both negative. She was started on Questran which helped the diarrhea significantly. It has now resolved. Her nausea also resolved. In fact, Dashanti tells me that other than being tired, she is feeling well today and is "eating like a horse". He has had no fevers or chills. No abdominal pain. She continues to deny any mouth ulcers. She also denies any signs of peripheral neuropathy in either the upper or lower extremities.  I will make note of the mention that he is scheduled to meet with her primary care physician later this week to review her diabetes control.   A detailed review of systems is otherwise noncontributory as noted below.  Review of Systems: Constitutional:  Fatigue, no weight loss, fever, night sweats Eyes: negative [id number] Cardiovascular: no chest pain or dyspnea on exertion Respiratory: no cough, shortness of breath, or wheezing Neurological: no TIA or stroke symptoms negative Dermatological: negative Gastrointestinal: no nausea or emesis, no change in bowel habits, no diarrhea and no black or bloody stools Genito-Urinary: no dysuria, trouble voiding, or hematuria Hematological and Lymphatic: negative Breast: negative Musculoskeletal: negative  Remaining ROS negative.   Past Medical History   Diagnosis  Date   .  Elevated cholesterol    .  GERD (gastroesophageal reflux disease)    .  Thyroid disease    .  Hypertension    .  Coronary artery disease      stent 2007   .  Depression    .  Diabetes mellitus    .  Hypothyroidism    .  Cancer      right breast   .  Breast cancer  12/05/10     R breast, inv mammary, in situ    The patient also has a history of coronary artery disease, status post stenting; history of hypothyroidism; history of hypertension; history of GERD; and history of diabetes mellitus.   Past Surgical History   Procedure   Date   .  Cholecystectomy    .  Knee surgery  2007   .  Carotid stent  2007   .  Carpal tunnel release  9/12,8,12     both done   .  Dilation and curettage of uterus    .  Coronary angioplasty    .  Multiple extractions with alveoloplasty    .  Breast lumpectomy  1983     benign biopsy   .  Breast surgery  2012     R lumpectomy, snbx, ER/PR +, Her2 -, 0/1 node pos.    Family History   Problem  Relation  Age of Onset   .  Cancer  Son    .  Cancer  Father       throat    .  Cancer  Brother  82      lymphoma    The patient's father died at the age of 11 from cancer "in his temples". The patient's mother died from renal failure at the age of 80. The patient had 3 brothers, one of whom died at the age of 29 from non-Hodgkin's lymphoma. She has one sister. There is no history of breast or ovarian cancer in the family to her knowledge.   Gynecologic history: GX P3. Menarche age 21, last menstrual period in her mid 37s.She used hormone replacement for about 10 years, discontinuing that in 2002.   Social History: She is a retired Radiation protection practitioner. Her husband [name] is a retired Quarry manager. One daughter died from suicide at the age of 43 in the setting of postpartum depression.A second daughter, [name], works in [place] as a Art therapist. Son Adisyn Kaman 36 is a Quarry manager in [place]. The patient has 7 grandchildren. She attends a [employer].   Medications:   I have reviewed the patient's current medications.  Current Outpatient Prescriptions  Medication Sig Dispense Refill  . aspirin 81 MG tablet Take 81 mg by mouth daily before breakfast.       . BD PEN NEEDLE NANO U/F 32G X 4 MM MISC       . carvedilol (COREG) 12.5 MG tablet Take 6.25 mg by mouth 2 (two) times daily with a meal.       . cholecalciferol (VITAMIN D) 1000 UNITS tablet Take 1,000 Units by mouth daily.        . cholestyramine (QUESTRAN) 4 G packet 1 packet PO BID, dissolved in beverage  and taken with meals, prn diarrhea  30 each  1  . dexamethasone (DECADRON) 4 MG tablet Take 2 tablets two times a day the day before Taxotere. Then take 2 tabs two times a day starting the day after chemo for 3 days.  30 tablet  1  . LEVEMIR FLEXPEN 100 UNIT/ML injection Inject 60-100 Units into the skin daily after breakfast. 100 in the am and 60 at night      . lidocaine-prilocaine (EMLA) cream Apply topically as needed.  30 g  0  . lisinopril (PRINIVIL,ZESTRIL) 40 MG tablet Take 40 mg by mouth daily after breakfast.       . LIVALO 2 MG TABS Take 2 mg  by mouth Daily.       Marland Kitchen loperamide (IMODIUM) 2 MG capsule Take 1 capsule (2 mg total) by mouth 4 (four) times daily as needed for diarrhea or loose stools.  12 capsule  0  . LORazepam (ATIVAN) 0.5 MG tablet Take 1 tablet (0.5 mg total) by mouth 2 (two) times daily as needed (anxiety and/or nausea).  30 tablet  0  . Omega-3 Fatty Acids (FISH OIL) 1200 MG CAPS Take 1 capsule by mouth 2 (two) times daily.        . ondansetron (ZOFRAN ODT) 8 MG disintegrating tablet Take 1 tablet (8 mg total) by mouth every 8 (eight) hours as needed for nausea.  20 tablet  0  . ondansetron (ZOFRAN) 8 MG tablet Take 1 tablet two times a day starting the day after chemo for 3 days. Then take 1 tab two times a day as needed for nausea or vomiting.  30 tablet  1  . pantoprazole (PROTONIX) 40 MG tablet Take 40 mg by mouth daily.       Marland Kitchen PARoxetine (PAXIL) 20 MG tablet Take 20 mg by mouth every morning.       . prochlorperazine (COMPAZINE) 10 MG tablet Take 1 tablet (10 mg total) by mouth every 6 (six) hours as needed (Nausea or vomiting).  30 tablet  1  . sitaGLIPtin (JANUVIA) 50 MG tablet Take 50 mg by mouth daily.       . sodium chloride 0.9 % SOLN Inject into the vein. Chemo on Monday thru [facility]. Does not know what she recieved      . SYNTHROID 50 MCG tablet Take 50 mcg by mouth daily before breakfast.       . tobramycin-dexamethasone (TOBRADEX)  ophthalmic solution       . WELCHOL 625 MG tablet Take 1,250 mg by mouth 2 (two) times daily with a meal.         Allergies:  Allergies  Allergen Reactions  . Codeine Nausea And Vomiting and Other (See Comments)    Severe stomach cramps  . Antihistamines, Diphenhydramine-Type Other (See Comments)    Causes hyperactivity  . Erythromycin Hives and Rash    Due to dental work about 50 years ago. Was used in packing and resulted in rash/hives inside and outside of mouth.    Physical Exam: Filed Vitals:   02/26/11 1325  BP: 130/71  Pulse: 99  Temp: 98 F (36.7 C)   HEENT:  Sclerae anicteric, conjunctivae pink.  Oropharynx clear.  No mucositis or candidiasis.   Nodes:  No cervical, supraclavicular, or axillary lymphadenopathy palpated.  Breast Exam:  Deferred   Lungs:  Clear to auscultation bilaterally.  No crackles, rhonchi, or wheezes.   Heart:  Regular rate and rhythm.   Abdomen:  Soft, nontender.  Positive bowel sounds.  No organomegaly or masses palpated.   Musculoskeletal:  No focal spinal tenderness to palpation.  Extremities:  Benign.  No peripheral edema or cyanosis.   Skin:  Benign.   Neuro:  Nonfocal.   Lab Results: Lab Results  Component Value Date   WBC 7.7 02/26/2011   HGB 11.8 02/26/2011   HCT 34.4* 02/26/2011   MCV 89.9 02/26/2011   PLT 182 02/26/2011   NEUTROABS 5.5 02/26/2011     Chemistry      Component Value Date/Time   NA 138 02/26/2011 1306   K 4.6 02/26/2011 1306   CL 104 02/26/2011 1306   CO2 24 02/26/2011 1306  BUN 13 02/26/2011 1306   CREATININE 0.89 02/26/2011 1306      Component Value Date/Time   CALCIUM 9.0 02/26/2011 1306   ALKPHOS 120* 02/26/2011 1306   AST 16 02/26/2011 1306   ALT 21 02/26/2011 1306   BILITOT 0.2* 02/26/2011 1306        Radiological Studies:  No studies found.    Assessment:  71 year old [place] woman   (1)  status post right lumpectomy and sentinel lymph node biopsy November of 2012 for a pT1c pN0, Stage I invasive ductal  carcinoma, grade 3, strongly estrogen and progesterone receptor positive, HER-2 not amplified, with an MIB-1-1 of 96%. Her Oncotype recurrence score predicts a 14% risk of distant recurrence if her only systemic adjuvant therapy is tamoxifen for five years.  (2)  receiving adjuvant chemotherapy, the plan being to complete 4 q. three-week doses of docetaxel/cyclophosphamide, Neulasta given on day 2 for granulocyte support.   Plan:  Beginning with cycle 2, we will plan on tapering Osha's dexamethasone over several days, and perhaps this will give her better nausea control, and keep her from having the "crash" after stopping the steroids. She's also started on lorazepam, 0.5 mg up to twice daily, but especially to take at night to help her sleep. This also helped with her anxiety, as well as mild nausea. She can continue on the Questran as needed for diarrhea, and she knows to keep herself well hydrated.   Omya has all of these instructions in writing, and we did spend some time reviewing them again today. We'll review them once again when she returns to see me next week on February 11 in anticipation of her second dose of chemotherapy.  This plan was reviewed with the patient, who voices understanding and agreement.  She knows to call with any changes or problems.    Aleatha Taite, PA-C 02/26/2011

## 2011-02-26 NOTE — Telephone Encounter (Signed)
lmonvm adviisng the pt of her feb appts and to pick up an appt calendar

## 2011-02-28 LAB — CLOSTRIDIUM DIFFICILE EIA

## 2011-03-05 ENCOUNTER — Telehealth: Payer: Self-pay | Admitting: Oncology

## 2011-03-05 ENCOUNTER — Ambulatory Visit (HOSPITAL_BASED_OUTPATIENT_CLINIC_OR_DEPARTMENT_OTHER): Payer: Medicare Other | Admitting: Physician Assistant

## 2011-03-05 ENCOUNTER — Other Ambulatory Visit (HOSPITAL_BASED_OUTPATIENT_CLINIC_OR_DEPARTMENT_OTHER): Payer: Medicare Other | Admitting: Lab

## 2011-03-05 ENCOUNTER — Ambulatory Visit (HOSPITAL_BASED_OUTPATIENT_CLINIC_OR_DEPARTMENT_OTHER): Payer: Medicare Other

## 2011-03-05 ENCOUNTER — Encounter: Payer: Self-pay | Admitting: Physician Assistant

## 2011-03-05 VITALS — BP 144/90 | HR 101 | Temp 98.3°F | Ht 63.0 in | Wt 167.3 lb

## 2011-03-05 DIAGNOSIS — C50919 Malignant neoplasm of unspecified site of unspecified female breast: Secondary | ICD-10-CM

## 2011-03-05 DIAGNOSIS — R197 Diarrhea, unspecified: Secondary | ICD-10-CM

## 2011-03-05 DIAGNOSIS — G4700 Insomnia, unspecified: Secondary | ICD-10-CM

## 2011-03-05 DIAGNOSIS — Z170 Estrogen receptor positive status [ER+]: Secondary | ICD-10-CM

## 2011-03-05 DIAGNOSIS — C50519 Malignant neoplasm of lower-outer quadrant of unspecified female breast: Secondary | ICD-10-CM

## 2011-03-05 DIAGNOSIS — Z5111 Encounter for antineoplastic chemotherapy: Secondary | ICD-10-CM

## 2011-03-05 LAB — COMPREHENSIVE METABOLIC PANEL
AST: 19 U/L (ref 0–37)
Albumin: 3.9 g/dL (ref 3.5–5.2)
Alkaline Phosphatase: 86 U/L (ref 39–117)
BUN: 28 mg/dL — ABNORMAL HIGH (ref 6–23)
Creatinine, Ser: 1 mg/dL (ref 0.50–1.10)
Glucose, Bld: 351 mg/dL — ABNORMAL HIGH (ref 70–99)
Potassium: 4.4 mEq/L (ref 3.5–5.3)
Total Bilirubin: 0.4 mg/dL (ref 0.3–1.2)

## 2011-03-05 LAB — CBC WITH DIFFERENTIAL/PLATELET
BASO%: 0.1 % (ref 0.0–2.0)
Basophils Absolute: 0 10*3/uL (ref 0.0–0.1)
EOS%: 0 % (ref 0.0–7.0)
HCT: 32.5 % — ABNORMAL LOW (ref 34.8–46.6)
HGB: 11.3 g/dL — ABNORMAL LOW (ref 11.6–15.9)
LYMPH%: 9.6 % — ABNORMAL LOW (ref 14.0–49.7)
MCH: 29.9 pg (ref 25.1–34.0)
MCHC: 34.8 g/dL (ref 31.5–36.0)
MCV: 86 fL (ref 79.5–101.0)
MONO%: 4.2 % (ref 0.0–14.0)
NEUT%: 86.1 % — ABNORMAL HIGH (ref 38.4–76.8)
lymph#: 1.5 10*3/uL (ref 0.9–3.3)

## 2011-03-05 MED ORDER — DOCETAXEL CHEMO INJECTION 160 MG/16ML
75.0000 mg/m2 | Freq: Once | INTRAVENOUS | Status: AC
  Administered 2011-03-05: 140 mg via INTRAVENOUS
  Filled 2011-03-05: qty 14

## 2011-03-05 MED ORDER — ONDANSETRON 16 MG/50ML IVPB (CHCC)
16.0000 mg | Freq: Once | INTRAVENOUS | Status: AC
  Administered 2011-03-05: 16 mg via INTRAVENOUS

## 2011-03-05 MED ORDER — SODIUM CHLORIDE 0.9 % IV SOLN
600.0000 mg/m2 | Freq: Once | INTRAVENOUS | Status: AC
  Administered 2011-03-05: 1100 mg via INTRAVENOUS
  Filled 2011-03-05: qty 55

## 2011-03-05 MED ORDER — SODIUM CHLORIDE 0.9 % IV SOLN
Freq: Once | INTRAVENOUS | Status: AC
  Administered 2011-03-05: 11:00:00 via INTRAVENOUS

## 2011-03-05 MED ORDER — SODIUM CHLORIDE 0.9 % IJ SOLN
10.0000 mL | INTRAMUSCULAR | Status: DC | PRN
  Administered 2011-03-05: 10 mL
  Filled 2011-03-05: qty 10

## 2011-03-05 MED ORDER — DEXAMETHASONE SODIUM PHOSPHATE 4 MG/ML IJ SOLN
20.0000 mg | Freq: Once | INTRAMUSCULAR | Status: AC
  Administered 2011-03-05: 20 mg via INTRAVENOUS

## 2011-03-05 MED ORDER — HEPARIN SOD (PORK) LOCK FLUSH 100 UNIT/ML IV SOLN
500.0000 [IU] | Freq: Once | INTRAVENOUS | Status: AC | PRN
  Administered 2011-03-05: 500 [IU]
  Filled 2011-03-05: qty 5

## 2011-03-05 NOTE — Telephone Encounter (Signed)
gve the pt her feb-April 2013 appt calendar

## 2011-03-05 NOTE — Progress Notes (Signed)
Hematology and Oncology Follow Up Visit  [name] [id number] October 06, 1940 71 y.o. 03/05/2011    HPI: The patient had a screening mammogram July of 2009, which was unremarkable. Screening mammography this year showed a possible mass in the right breast. Right diagnostic mammogram 12/05/2010 confirmed an irregularly marginated oval mass in the right breast at the 6:00 position measuring up to 1.4 cm. On physical exam Dr. [name] was not able to palpate a discrete abnormality, but ultrasound showed an irregularly marginated hypoechoic mass measuring 1.1 cm, and containing microcalcifications.   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 Estrogen receptors were expressed with 100% positivity, same for progesterone receptors, but there was no HER-2 amplification, with the ratio by CISH being 0.96. The MIB-1 was 96%.   With this information the patient underwent bilateral breast MRIs on 12/12/2010, showing a solitary 1.4 cm irregular enhancing mass in the right breast, but no other suspicious masses and no associated adenopathy.   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 Margins were negative. Repeat HER-2 was again negative.  The decision was made to pursue adjuvant chemotherapy, to consist of 4 q. three-week doses of docetaxel and cyclophosphamide, Neulasta to be given on day 2 for granulocyte support.  Interim History:   [name] returns today accompanied by her husband, [name], for followup in anticipation of day 1, cycle 2 of 4 planned q. three-week doses of docetaxel/cyclophosphamide, given in the adjuvant setting for right breast carcinoma. She received Neulasta on day 2 for granulocyte support.    [name] had significant nausea and diarrhea following cycle 1. She was  followed her closely, and fortunately he does side effects have resolved. She has Questran on home which helped for diarrhea. Her appetite is "back to normal". She's had no additional nausea, and no emesis. We have decided to use a dexamethasone taper with cycle 2  In hopes of better nausea control, and also in the hopes of preventing and a "crash" after stopping the steroid.  [name] continues to be tired. She has some lower back pain that comes and goes, especially if she stands for prolonged period of time. She's had no signs of peripheral neuropathy. No mouth ulcers or oral sensitivity. No excessive tearing. No skin changes.  [name] was seen by her primary care physician last week to review her diabetes medications. As expected, while on the dexamethasone, her blood glucose is quite elevated. She will be following this closely over the next week, sharing the results with her primary care physician, who is thinking of starting her on a sliding scale insulin for the next few weeks.   A detailed review of systems is otherwise noncontributory as noted below.  Review of Systems: Constitutional:  Fatigue, no weight loss, fever, night sweats Eyes: negative [id number] Cardiovascular: no chest pain or dyspnea on exertion Respiratory: no cough, shortness of breath, or wheezing Neurological: no TIA or stroke symptoms negative Dermatological: negative Gastrointestinal: no nausea or emesis, no change in bowel habits, no diarrhea and no black or bloody stools Genito-Urinary: no dysuria, trouble voiding, or hematuria Hematological and Lymphatic: negative Breast: negative Musculoskeletal:mild lower back pain Remaining ROS negative.   Past Medical History   Diagnosis  Date   .  Elevated cholesterol    .  GERD (gastroesophageal reflux disease)    .  Thyroid disease    .  Hypertension    .  Coronary artery disease      stent 2007   .  Depression    .  Diabetes mellitus    .  Hypothyroidism    .   Cancer      right breast   .  Breast cancer  12/05/10     R breast, inv mammary, in situ    The patient also has a history of coronary artery disease, status post stenting; history of hypothyroidism; history of hypertension; history of GERD; and history of diabetes mellitus.   Past Surgical History   Procedure  Date   .  Cholecystectomy    .  Knee surgery  2007   .  Carotid stent  2007   .  Carpal tunnel release  9/12,8,12     both done   .  Dilation and curettage of uterus    .  Coronary angioplasty    .  Multiple extractions with alveoloplasty    .  Breast lumpectomy  1983     benign biopsy   .  Breast surgery  2012     R lumpectomy, snbx, ER/PR +, Her2 -, 0/1 node pos.    Family History   Problem  Relation  Age of Onset   .  Cancer  Son    .  Cancer  Father       throat    .  Cancer  Brother  64      lymphoma    The patient's father died at the age of 69 from cancer "in his temples". The patient's mother died from renal failure at the age of 78. The patient had 3 brothers, one of whom died at the age of 36 from non-Hodgkin's lymphoma. She has one sister. There is no history of breast or ovarian cancer in the family to her knowledge.   Gynecologic history: GX P3. Menarche age 50, last menstrual period in her mid 13s.She used hormone replacement for about 10 years, discontinuing that in 2002.   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 One daughter died from suicide at the age of 62 in the setting of postpartum depression.A second daughter, [name], works in [place] as a Art therapist. Son Eevie [name] 36 is a Quarry manager in [place]. The patient has 7 grandchildren. She attends a [employer].   Medications:   I have reviewed the patient's current medications.  Current Outpatient Prescriptions  Medication Sig Dispense Refill  . ondansetron (ZOFRAN-ODT) 8 MG disintegrating tablet Take 8 mg by  mouth Twice daily as needed.      Marland Kitchen aspirin 81 MG tablet Take 81 mg by mouth daily before breakfast.       . BD PEN NEEDLE NANO U/F 32G X 4 MM MISC       . carvedilol (COREG) 12.5 MG tablet Take 6.25 mg by mouth 2 (two) times daily with a meal.       . cholecalciferol (VITAMIN D) 1000 UNITS tablet Take 1,000 Units by mouth daily.        . cholestyramine (QUESTRAN) 4 G packet 1 packet PO BID, dissolved in beverage and taken with meals, prn diarrhea  30 each  1  . dexamethasone (DECADRON) 4 MG tablet Take 2 tablets two times a day the day before Taxotere. Then take 2 tabs two times a day starting the day after chemo  for 3 days.  30 tablet  1  . LEVEMIR FLEXPEN 100 UNIT/ML injection Inject 60-100 Units into the skin daily after breakfast. 100 in the am and 60 at night      . lidocaine-prilocaine (EMLA) cream Apply topically as needed.  30 g  0  . lisinopril (PRINIVIL,ZESTRIL) 40 MG tablet Take 40 mg by mouth daily after breakfast.       . LIVALO 2 MG TABS Take 2 mg by mouth Daily.       Marland Kitchen LORazepam (ATIVAN) 0.5 MG tablet Take 1 tablet (0.5 mg total) by mouth 2 (two) times daily as needed (anxiety and/or nausea).  30 tablet  0  . Omega-3 Fatty Acids (FISH OIL) 1200 MG CAPS Take 1 capsule by mouth 2 (two) times daily.        . ondansetron (ZOFRAN) 8 MG tablet Take 1 tablet two times a day starting the day after chemo for 3 days. Then take 1 tab two times a day as needed for nausea or vomiting.  30 tablet  1  . pantoprazole (PROTONIX) 40 MG tablet Take 40 mg by mouth daily.       Marland Kitchen PARoxetine (PAXIL) 20 MG tablet Take 20 mg by mouth every morning.       . prochlorperazine (COMPAZINE) 10 MG tablet Take 1 tablet (10 mg total) by mouth every 6 (six) hours as needed (Nausea or vomiting).  30 tablet  1  . sitaGLIPtin (JANUVIA) 50 MG tablet Take 50 mg by mouth daily.       . sodium chloride 0.9 % SOLN Inject into the vein. Chemo on Monday thru [facility]. Does not know what she recieved      .  SYNTHROID 50 MCG tablet Take 50 mcg by mouth daily before breakfast.       . tobramycin-dexamethasone (TOBRADEX) ophthalmic solution       . WELCHOL 625 MG tablet Take 1,250 mg by mouth 2 (two) times daily with a meal.         Allergies:  Allergies  Allergen Reactions  . Codeine Nausea And Vomiting and Other (See Comments)    Severe stomach cramps  . Antihistamines, Diphenhydramine-Type Other (See Comments)    Causes hyperactivity  . Erythromycin Hives and Rash    Due to dental work about 50 years ago. Was used in packing and resulted in rash/hives inside and outside of mouth.    Physical Exam: Filed Vitals:   03/05/11 1000  BP: 144/90  Pulse: 101  Temp: 98.3 F (36.8 C)   HEENT:  Sclerae anicteric, conjunctivae pink.  Oropharynx clear.  No mucositis or candidiasis.   Nodes:  No cervical, supraclavicular, or axillary lymphadenopathy palpated.  Breast Exam:  Deferred   Lungs:  Clear to auscultation bilaterally.  No crackles, rhonchi, or wheezes.   Heart:  Regular rate and rhythm.   Abdomen:  Soft, nontender.  Positive bowel sounds.  No organomegaly or masses palpated.   Musculoskeletal:  No focal spinal tenderness to palpation.  Extremities:  Benign.  No peripheral edema or cyanosis.   Skin:  Benign.   Neuro:  Nonfocal.   Lab Results: Lab Results  Component Value Date   WBC 15.5* 03/05/2011   HGB 11.3* 03/05/2011   HCT 32.5* 03/05/2011   MCV 86.0 03/05/2011   PLT 340 03/05/2011   NEUTROABS 13.3* 03/05/2011     Chemistry      Component Value Date/Time   NA 138 02/26/2011 1306  K 4.6 02/26/2011 1306   CL 104 02/26/2011 1306   CO2 24 02/26/2011 1306   BUN 13 02/26/2011 1306   CREATININE 0.89 02/26/2011 1306      Component Value Date/Time   CALCIUM 9.0 02/26/2011 1306   ALKPHOS 120* 02/26/2011 1306   AST 16 02/26/2011 1306   ALT 21 02/26/2011 1306   BILITOT 0.2* 02/26/2011 1306        Radiological Studies:  No studies found.    Assessment:  71 year old [place] woman    (1)  status post right lumpectomy and sentinel lymph node biopsy November of 2012 for a pT1c pN0, Stage I invasive ductal carcinoma, grade 3, strongly estrogen and progesterone receptor positive, HER-2 not amplified, with an MIB-1-1 of 96%. Her Oncotype recurrence score predicts a 14% risk of distant recurrence if her only systemic adjuvant therapy is tamoxifen for five years.  (2)  receiving adjuvant chemotherapy, the plan being to complete 4 q. three-week doses of docetaxel/cyclophosphamide, Neulasta given on day 2 for granulocyte support.   Plan:   Makeyla will proceed to treatment today as scheduled for her second dose of doxorubicin/cyclophosphamide. As noted above, we will plan on tapering Nashla's dexamethasone over several daysshe will also continue on the lorazepam at night to help her sleep, especially while taking the dexamethasone. She will take Questran as needed for diarrhea, but will let us know if this does not resolve quickly.  Loriana will return next week on February 18 for assessment of chemotoxicity on day 8 cycle 2. She'll call prior that time with any changes or problems.    Fin Hupp, PA-C 03/05/2011

## 2011-03-06 ENCOUNTER — Ambulatory Visit (HOSPITAL_BASED_OUTPATIENT_CLINIC_OR_DEPARTMENT_OTHER): Payer: Medicare Other

## 2011-03-06 VITALS — BP 150/79 | HR 73 | Temp 98.4°F

## 2011-03-06 DIAGNOSIS — Z5189 Encounter for other specified aftercare: Secondary | ICD-10-CM

## 2011-03-06 DIAGNOSIS — C50919 Malignant neoplasm of unspecified site of unspecified female breast: Secondary | ICD-10-CM

## 2011-03-06 MED ORDER — PEGFILGRASTIM INJECTION 6 MG/0.6ML
6.0000 mg | Freq: Once | SUBCUTANEOUS | Status: AC
  Administered 2011-03-06: 6 mg via SUBCUTANEOUS
  Filled 2011-03-06: qty 0.6

## 2011-03-12 ENCOUNTER — Telehealth: Payer: Self-pay | Admitting: *Deleted

## 2011-03-12 ENCOUNTER — Ambulatory Visit (HOSPITAL_BASED_OUTPATIENT_CLINIC_OR_DEPARTMENT_OTHER): Payer: Medicare Other | Admitting: Oncology

## 2011-03-12 ENCOUNTER — Other Ambulatory Visit: Payer: Medicare Other | Admitting: Lab

## 2011-03-12 DIAGNOSIS — C50919 Malignant neoplasm of unspecified site of unspecified female breast: Secondary | ICD-10-CM

## 2011-03-12 DIAGNOSIS — C50519 Malignant neoplasm of lower-outer quadrant of unspecified female breast: Secondary | ICD-10-CM

## 2011-03-12 LAB — CBC WITH DIFFERENTIAL/PLATELET
Basophils Absolute: 0 10*3/uL (ref 0.0–0.1)
EOS%: 0.5 % (ref 0.0–7.0)
Eosinophils Absolute: 0.1 10*3/uL (ref 0.0–0.5)
HCT: 39.7 % (ref 34.8–46.6)
HGB: 13.7 g/dL (ref 11.6–15.9)
MCH: 30.6 pg (ref 25.1–34.0)
MONO#: 1.6 10*3/uL — ABNORMAL HIGH (ref 0.1–0.9)
NEUT#: 12.1 10*3/uL — ABNORMAL HIGH (ref 1.5–6.5)
NEUT%: 74.7 % (ref 38.4–76.8)
RDW: 13.2 % (ref 11.2–14.5)
lymph#: 2.5 10*3/uL (ref 0.9–3.3)

## 2011-03-12 MED ORDER — SODIUM CHLORIDE 0.9 % IV SOLN: Freq: Once | INTRAVENOUS | Status: DC

## 2011-03-12 NOTE — Telephone Encounter (Signed)
PT. PASSED OUT IN THE BATHROOM AND FELL. SHE WAS INCONTINENT OF STOOL. PT. WALKED TO HER BED WITH HELP. NO INJURY NOTED. THIS NOTE GIVEN TO DR.MAGRINAT'S VAL DODD,RN. SHE WILL CALL PT.'S HUSBAND TO CHANGE APPOINTMENT.

## 2011-03-12 NOTE — Progress Notes (Signed)
ID: EMMALI AKAMINE   DOB: 11/10/1940  MR#: [id number]  CSN#:[id number]  HISTORY OF PRESENT ILLNESS: The patient had a screening mammogram July of 2009, which was unremarkable. Screening mammography this year showed a possible mass in the right breast. Right diagnostic mammogram 12/05/2010 confirmed an irregularly marginated oval mass in the right breast at the 6:00 position measuring up to 1.4 cm. On physical exam Dr. [name] was not able to palpate a discrete abnormality, but ultrasound showed an irregularly marginated hypoechoic mass measuring 1.1 cm, and containing microcalcifications.  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 Estrogen receptors were expressed with 100% positivity, same for progesterone receptors, but there was no HER-2 amplification, with the ratio by C[facility] being 0.96. The MIB-1 was 96%.  With this information the patient underwent bilateral breast MRIs on 12/12/2010, showing a solitary 1.4 cm irregular enhancing mass in the right breast, but no other suspicious masses and no associated adenopathy.  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 Margins were negative. Repeat HER-2 was again negative.  The decision was made to pursue adjuvant chemotherapy, to consist of 4 q. three-week doses of docetaxel and cyclophosphamide, Neulasta to be given on day 2 for granulocyte support.  INTERVAL HISTORY: she is currently day 8 cycle 2 of her chemotherapy with docetaxel/cyclophosphamide.  REVIEW OF SYSTEMS: She had no bowel movement for several days. Then today about 5 in the morning she woke up with abdominal pain. She took a pain pill and that made her itchy; she then took some Benadryl. The combination of the Benadryl and itchiness made her very drowsy and she when  she got to the bathroom she slumped down and was incontinent of stool. There was no seizure activity, no loss of bladder control, and no frank loss of consciousness. Overall though I have to say she has not been able to tolerate treatment well. She has had decreased appetite, is making great effort to drink but finding it hard to keep up, and tells me her urine is "dark". Her vision is a little blurry. Her sugars of course go up with her dexamethasone treatments, though the high as she has recorded his to 40. She feels anxious. Otherwise a detailed review of systems today was noncontributory. Remarkably she has had no problems with nausea or vomiting.  PAST MEDICAL HISTORY: Past Medical History  Diagnosis Date  . Elevated cholesterol   . GERD (gastroesophageal reflux disease)   . Thyroid disease   . Hypertension   . Coronary artery disease     stent 2007  . Depression   . Diabetes mellitus   . Hypothyroidism   . Cancer     right breast  . 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 Arthritis   The patient also has a history of coronary artery disease, status post stenting; history of hypothyroidism; history of hypertension; history of GERD; and history of diabetes mellitus.    PAST SURGICAL HISTORY: Past Surgical History  Procedure Date  . Cholecystectomy   . Knee surgery 2007  . Carotid stent 2007  . Carpal tunnel release 9/12,8,12    both done  . Dilation and curettage of uterus   . Coronary angioplasty   . Multiple extractions with alveoloplasty   . Foot spurs REMOVED  .  Breast lumpectomy 1983    benign biopsy  . Breast surgery 2012    R lumpectomy, snbx, ER/PR +, Her2 -, 0/1 node pos.  . Portacath placement 02/07/2011    Procedure: INSERTION PORT-A-CATH;  Surgeon: [name], MD;  Location: [facility];  Service: General;  Laterality: N/A;    FAMILY HISTORY Family History  Problem Relation Age of Onset  . Cancer Son   . Cancer Father     throat  .  Cancer Brother 6    lymphoma  The patient's father died at the age of 44 from cancer "in his temples". The patient's mother died from renal failure at the age of 62. The patient had 3 brothers, one of whom died at the age of 74 from non-Hodgkin's lymphoma. She has one sister. There is no history of breast or ovarian cancer in the family to her knowledge.    GYNECOLOGIC HISTORY: GX P3. Menarche age 15, last menstrual period in her mid 24s.She used hormone replacement for about 10 years, discontinuing that in 2002.   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 One daughter died from suicide at the age of 75 in the setting of postpartum depression. A second daughter, [name], works in [place] as a Art therapist. Son Carmaleta Zein 36 is a Quarry manager in [place]. The patient has 7 grandchildren. She attends a [employer].    ADVANCED DIRECTIVES:  HEALTH MAINTENANCE: History  Substance Use Topics  . Smoking status: Former Smoker -- 2.0 packs/day for 40 years    Quit date: 12/19/1997  . Smokeless tobacco: Former Systems developer    Quit date: 01/22/1997  . Alcohol Use: 3.6 oz/week    6 Glasses of wine per week     occassional     Colonoscopy:  PAP:  Bone density:  Lipid panel:  Allergies  Allergen Reactions  . Codeine Nausea And Vomiting and Other (See Comments)    Severe stomach cramps  . Antihistamines, Diphenhydramine-Type Other (See Comments)    Causes hyperactivity  . Erythromycin Hives and Rash    Due to dental work about 50 years ago. Was used in packing and resulted in rash/hives inside and outside of mouth.    Current Outpatient Prescriptions  Medication Sig Dispense Refill  . aspirin 81 MG tablet Take 81 mg by mouth daily before breakfast.       . BD PEN NEEDLE NANO U/F 32G X 4 MM MISC       . carvedilol (COREG) 12.5 MG tablet Take 6.25 mg by mouth 2 (two) times daily with a meal.       . cholecalciferol  (VITAMIN D) 1000 UNITS tablet Take 1,000 Units by mouth daily.        . cholestyramine (QUESTRAN) 4 G packet 1 packet PO BID, dissolved in beverage and taken with meals, prn diarrhea  30 each  1  . dexamethasone (DECADRON) 4 MG tablet Take 2 tablets two times a day the day before Taxotere. Then take 2 tabs two times a day starting the day after chemo for 3 days.  30 tablet  1  . LEVEMIR FLEXPEN 100 UNIT/ML injection Inject 60-100 Units into the skin daily after breakfast. 100 in the am and 60 at night      . lidocaine-prilocaine (EMLA) cream Apply topically as needed.  30 g  0  . lisinopril (PRINIVIL,ZESTRIL) 40 MG tablet Take 40 mg by mouth daily after breakfast.       .  LIVALO 2 MG TABS Take 2 mg by mouth Daily.       Marland Kitchen LORazepam (ATIVAN) 0.5 MG tablet Take 1 tablet (0.5 mg total) by mouth 2 (two) times daily as needed (anxiety and/or nausea).  30 tablet  0  . Omega-3 Fatty Acids (FISH OIL) 1200 MG CAPS Take 1 capsule by mouth 2 (two) times daily.        . ondansetron (ZOFRAN) 8 MG tablet Take 1 tablet two times a day starting the day after chemo for 3 days. Then take 1 tab two times a day as needed for nausea or vomiting.  30 tablet  1  . ondansetron (ZOFRAN-ODT) 8 MG disintegrating tablet Take 8 mg by mouth Twice daily as needed.      . pantoprazole (PROTONIX) 40 MG tablet Take 40 mg by mouth daily.       Marland Kitchen PARoxetine (PAXIL) 20 MG tablet Take 20 mg by mouth every morning.       . prochlorperazine (COMPAZINE) 10 MG tablet Take 1 tablet (10 mg total) by mouth every 6 (six) hours as needed (Nausea or vomiting).  30 tablet  1  . sitaGLIPtin (JANUVIA) 50 MG tablet Take 50 mg by mouth daily.       . sodium chloride 0.9 % SOLN Inject into the vein. Chemo on Monday thru [facility]. Does not know what she recieved      . SYNTHROID 50 MCG tablet Take 50 mcg by mouth daily before breakfast.       . tobramycin-dexamethasone (TOBRADEX) ophthalmic solution       . WELCHOL 625 MG tablet Take  1,250 mg by mouth 2 (two) times daily with a meal.         OBJECTIVE: Middle-aged white woman who appears exhausted Filed Vitals:   03/12/11 1309  BP: 80/54  Pulse: 105  Temp: 97.6 F (36.4 C)     Body mass index is 28.13 kg/(m^2).    ECOG FS: 2  Sclerae unicteric Oropharynx clear No peripheral adenopathy Lungs no rales or rhonchi Heart regular rate and rhythm Abd soft, nontender, positive bowel sounds, no masses palpated MSK no focal spinal tenderness Neuro: nonfocal; she is alert and oriented x3 Breasts: Deferred  LAB RESULTS: Lab Results  Component Value Date   WBC 15.5* 03/05/2011   NEUTROABS 13.3* 03/05/2011   HGB 11.3* 03/05/2011   HCT 32.5* 03/05/2011   MCV 86.0 03/05/2011   PLT 340 03/05/2011      Chemistry      Component Value Date/Time   NA 133* 03/05/2011 0949   K 4.4 03/05/2011 0949   CL 98 03/05/2011 0949   CO2 24 03/05/2011 0949   BUN 28* 03/05/2011 0949   CREATININE 1.00 03/05/2011 0949      Component Value Date/Time   CALCIUM 9.7 03/05/2011 0949   ALKPHOS 86 03/05/2011 0949   AST 19 03/05/2011 0949   ALT 25 03/05/2011 0949   BILITOT 0.4 03/05/2011 0949       No results found for this basename: LABCA2    No results found for this basename: INR:1;PROTIME:1 in the last 168 hours  No results found for this basename: UACOL:1,UAPR:1,USPG:1,UPH:1,UTP:1,UGL:1,UKET:1,UBIL:1,UHGB:1,UNIT:1,UROB:1,ULEU:1,UEPI:1,UWBC:1,URBC:1,UBAC:1,CAST:1,CRYS:1,UCOM:1,BILUA:1 in the last 72 hours   STUDIES: No new results found.  ASSESSMENT:71 year old [place] woman   (1) status post right lumpectomy and sentinel lymph node biopsy November of 2012 for a pT1c pN0, Stage I invasive ductal carcinoma, grade 3, strongly estrogen and progesterone receptor positive, HER-2 not amplified, with an  MIB-1-1 of 96%. Her Oncotype recurrence score predicts a 14% risk of distant recurrence if her only systemic adjuvant therapy is tamoxifen for five years.   (2) receiving q. three-week doses  of docetaxel/cyclophosphamide, started 02/12/2011, currently day 8 cycle 2.    PLAN: I don't think we are going to be able to continue this chemotherapy. Options include lowering the dose significantly, switching perhaps to CMF (which however would increase the number of cycles), or discontinuing chemotherapy, moving on to radiation, and leading the anti-estrogens carry the main weight of risk reduction.  If we had to make a decision today I would vote for discontinuing chemotherapy. I think the benefit to her of 2 more cycles likely would be marginal and I am not sure she could really tolerate them. However we decided not to make the decision today but when she returns on the 28th. My expectation is that we will likely stop chemotherapy and refer her to radiation, and I am putting in an appointment for her to see Dr. Lisbeth Renshaw if possible the same day that she returns to see [place] before her next cycle.  She would then see me again at the current duration of radiation so he can choose her best anti-estrogen therapy.   Alcides Nutting C    03/12/2011

## 2011-03-13 ENCOUNTER — Telehealth: Payer: Self-pay | Admitting: *Deleted

## 2011-03-13 NOTE — Telephone Encounter (Signed)
gave patient appointment for 02-2011 thru 04-2011 made appointment for patient to see dr.moody on 04-30-2011 starting at 3:00pm

## 2011-03-15 ENCOUNTER — Other Ambulatory Visit: Payer: Self-pay | Admitting: *Deleted

## 2011-03-19 ENCOUNTER — Ambulatory Visit
Admission: RE | Admit: 2011-03-19 | Discharge: 2011-03-19 | Disposition: A | Discharge status: Home / Self Care | Payer: Medicare Other | Source: Ambulatory Visit | Attending: Radiation Oncology | Admitting: Radiation Oncology

## 2011-03-19 ENCOUNTER — Encounter: Payer: Self-pay | Admitting: Radiation Oncology

## 2011-03-19 VITALS — BP 111/73 | HR 89 | Temp 97.6°F | Resp 20 | Ht 63.0 in | Wt 165.5 lb

## 2011-03-19 DIAGNOSIS — C50919 Malignant neoplasm of unspecified site of unspecified female breast: Secondary | ICD-10-CM

## 2011-03-19 DIAGNOSIS — C50519 Malignant neoplasm of lower-outer quadrant of unspecified female breast: Secondary | ICD-10-CM

## 2011-03-19 HISTORY — DX: Malignant neoplasm of unspecified site of unspecified female breast: C50.919

## 2011-03-19 NOTE — Progress Notes (Signed)
Pt only completed 2 of 4 chemo tx due to intolerance: nausea, fatigue. Last chemo 03/05/11. Pt states she is feeling more energetic.

## 2011-03-19 NOTE — Progress Notes (Signed)
Please see the Nurse Progress Note in the MD Initial Consult Encounter for this patient. 

## 2011-03-20 ENCOUNTER — Other Ambulatory Visit: Payer: Self-pay | Admitting: *Deleted

## 2011-03-20 DIAGNOSIS — C50519 Malignant neoplasm of lower-outer quadrant of unspecified female breast: Secondary | ICD-10-CM

## 2011-03-20 NOTE — Progress Notes (Signed)
CC:   [name] Gave, MD Chauncey Cruel, M.D. Herma Mering, MD  DIAGNOSIS:  Invasive ductal carcinoma of the right breast, T1c N0 M0, ER positive, PR positive, and HER-2/neu negative.  NARRATIVE:  [name] returns to clinic today for followup.  She has been proceeding with adjuvant chemotherapy with Dr. Jana Hakim.  However, the patient states that after 2 cycles she has been intolerant of the chemotherapy.  She ascribes this to a little bit of nausea, but primarily some very significant fatigue.  She has discussed this issue with Dr. Jana Hakim, who feels that additional chemo would be of marginal benefit and therefore the patient has completed her chemo with her last chemo having been given on 03/05/2011.  She presents today for further discussion and coordination of an anticipated upcoming course of adjuvant radiotherapy for her early stage right-sided breast cancer.  PHYSICAL EXAMINATION:  Vital Signs:  Weight 165 pounds, blood pressure 111/73, pulse 89, respiratory rate 20, temperature 97.6.  Neck:  Supple without any lymphadenopathy.  Cardiovascular:  Regular rate and rhythm. Respiratory:  Clear to auscultation.  GI:  Abdomen is soft, nontender. Normal bowel sounds.  IMPRESSION AND PLAN:  [name] has completed her chemo after a couple of cycles with her not being able to tolerate the anticipated full course.  She and Dr. Jana Hakim have made the decision for her to proceed with adjuvant radiotherapy at this time.  I have discussed this once again with her and would anticipate a 6-1/2-week course of treatment. All of her questions were answered and the patient will be scheduled for a simulation later this week.  I spent 15 minutes with [name] today, the majority of which was spent counseling her on her diagnosis of breast cancer and coordinating her care.    ______________________________ Jodelle Gross, M.D., Ph.D. JSM/MEDQ  D:  03/20/2011  T:   03/20/2011  Job:  [id number]

## 2011-03-20 NOTE — Progress Notes (Signed)
Message left by pt requesting need to cancel all chemo and associated lab appts. She is also requesting port to be removed by Dr Donne Hazel since she has completed chemotherapy regimen of treatment plan.  This RN generated referral per above with request sent to scheduling.

## 2011-03-21 ENCOUNTER — Telehealth: Payer: Self-pay | Admitting: *Deleted

## 2011-03-21 NOTE — Telephone Encounter (Signed)
cancelled all patient appointment for lab and chemo made patient appointment to have port removed with dr.wakefield on 04-10-2011 at 4:10pm

## 2011-03-22 ENCOUNTER — Other Ambulatory Visit: Payer: Medicare Other | Admitting: Lab

## 2011-03-22 ENCOUNTER — Ambulatory Visit: Payer: Medicare Other | Admitting: Physician Assistant

## 2011-03-23 ENCOUNTER — Ambulatory Visit
Admission: RE | Admit: 2011-03-23 | Discharge: 2011-03-23 | Disposition: A | Discharge status: Home / Self Care | Payer: Medicare Other | Source: Ambulatory Visit | Attending: Radiation Oncology | Admitting: Radiation Oncology

## 2011-03-23 ENCOUNTER — Telehealth: Payer: Self-pay | Admitting: Oncology

## 2011-03-23 DIAGNOSIS — C50519 Malignant neoplasm of lower-outer quadrant of unspecified female breast: Secondary | ICD-10-CM | POA: Insufficient documentation

## 2011-03-23 NOTE — Telephone Encounter (Signed)
S/w the pt regarding her 04/16/2011 appts. Pt appts have been cancelled. S/w the pt and she does not wish to be rescheduled at this time. Will call.

## 2011-03-23 NOTE — Progress Notes (Signed)
Met with patient to discuss RO billing.  Dx: 174.9 Female Breast, NOS (excludes Skin of breast T-173.5)  Rad Tx: [id number] Extrl Beam  Attending Rad: Dr. Lisbeth Renshaw

## 2011-03-26 ENCOUNTER — Other Ambulatory Visit: Payer: Medicare Other | Admitting: Lab

## 2011-03-26 ENCOUNTER — Ambulatory Visit: Payer: Medicare Other

## 2011-03-27 ENCOUNTER — Ambulatory Visit: Payer: Medicare Other

## 2011-03-27 NOTE — Progress Notes (Signed)
CC:   [name]  DIAGNOSIS:  Invasive ductal carcinoma of the left breast, T1c N0 M0.  INTERVAL HISTORY:  Ms. [name] returns to clinic today for followup.  She completed her course of radiation on 03/09/2011.  The patient indicates that she is to proceed with a hysterectomy and her surgeon has requested that we check her skin since there is some ongoing significant redness in the treatment area.  She feels that this is much less erythematous than it was last week.  She has continued to use RadiaPlex skin cream and denies significant pain.  PHYSICAL EXAM:  Diffuse erythema and hyperpigmentation is present in the treatment area.  At the site of the patient's boost, there is certainly a sharply demarcated area of increased redness which is brighter erythema.  No moist desquamation and this area does appear to be healing adequately at this time.  No sign of cellulitis or other infection.  IMPRESSION/PLAN:  Ms. [name] did have a fairly brisk skin reaction in the boost area, but this corresponds sharply to the treatment area, and I believe that this would be consistent with a continuing healing process as the patient has described.  I therefore believe that she is healing adequately although I do want her to come back to see me again in 2 weeks to make sure that this area does continue to heal as expected.    ______________________________ Jodelle Gross, M.D., Ph.D. JSM/MEDQ  D:  03/27/2011  T:  03/27/2011  Job:  [id number]

## 2011-03-28 ENCOUNTER — Other Ambulatory Visit (INDEPENDENT_AMBULATORY_CARE_PROVIDER_SITE_OTHER): Payer: Self-pay | Admitting: General Surgery

## 2011-03-28 ENCOUNTER — Telehealth (INDEPENDENT_AMBULATORY_CARE_PROVIDER_SITE_OTHER): Payer: Self-pay | Admitting: General Surgery

## 2011-03-28 DIAGNOSIS — Z853 Personal history of malignant neoplasm of breast: Secondary | ICD-10-CM

## 2011-03-28 NOTE — Telephone Encounter (Signed)
Lars Mage, We can just schedule her to do this if she wants or she can come see me if she wants.  Let me know which one. MW

## 2011-03-28 NOTE — Telephone Encounter (Signed)
The patient contacted the office stating that she spoke with Dr Jana Hakim and he stated it was okay to remove her port-a-cath. The patient states he was to contact Dr Donne Hazel . Please contact the patient regarding this.

## 2011-03-28 NOTE — Telephone Encounter (Signed)
Spoke with patient who is okay scheduling this without seeing Dr Donne Hazel first. I let her know we would put in orders and call her back.

## 2011-03-30 ENCOUNTER — Ambulatory Visit
Admission: RE | Admit: 2011-03-30 | Discharge: 2011-03-30 | Disposition: A | Discharge status: Home / Self Care | Payer: Medicare Other | Source: Ambulatory Visit | Attending: Radiation Oncology | Admitting: Radiation Oncology

## 2011-03-30 DIAGNOSIS — C50519 Malignant neoplasm of lower-outer quadrant of unspecified female breast: Secondary | ICD-10-CM

## 2011-04-01 NOTE — Progress Notes (Signed)
[facility] Simulation Verification Note   Name: [name] MRN: [id number]  Date: 04/01/2011  DOB: 1940-03-21     DIAGNOSIS: The encounter diagnosis was Cancer of lower-outer quadrant of female breast.    NARRATIVE: The patient presented for simulation and verification prior to beginning her radiation treatment. He isocenters accurately placed. The blocks appropriately target the correct area. We therefore will proceed with her course of treatment as planned.

## 2011-04-02 ENCOUNTER — Ambulatory Visit: Payer: Medicare Other | Admitting: Physician Assistant

## 2011-04-02 ENCOUNTER — Ambulatory Visit
Admission: RE | Admit: 2011-04-02 | Discharge: 2011-04-02 | Disposition: A | Discharge status: Home / Self Care | Payer: Medicare Other | Source: Ambulatory Visit | Attending: Radiation Oncology | Admitting: Radiation Oncology

## 2011-04-02 ENCOUNTER — Other Ambulatory Visit: Payer: Medicare Other | Admitting: Lab

## 2011-04-02 DIAGNOSIS — C50519 Malignant neoplasm of lower-outer quadrant of unspecified female breast: Secondary | ICD-10-CM

## 2011-04-02 MED ORDER — ALRA NON-METALLIC DEODORANT (RAD-ONC)
1.0000 "application " | Freq: Once | TOPICAL | Status: AC
  Administered 2011-04-02: 1 via TOPICAL

## 2011-04-02 MED ORDER — RADIAPLEXRX EX GEL
Freq: Once | CUTANEOUS | Status: AC
  Administered 2011-04-02: 11:00:00 via TOPICAL

## 2011-04-02 NOTE — Progress Notes (Signed)
Post sim ed completed. Gave pt "Radiation and You" booklet. All questions answered.

## 2011-04-03 ENCOUNTER — Ambulatory Visit
Admission: RE | Admit: 2011-04-03 | Discharge: 2011-04-03 | Disposition: A | Discharge status: Home / Self Care | Payer: Medicare Other | Source: Ambulatory Visit | Attending: Radiation Oncology | Admitting: Radiation Oncology

## 2011-04-04 ENCOUNTER — Ambulatory Visit
Admission: RE | Admit: 2011-04-04 | Discharge: 2011-04-04 | Disposition: A | Discharge status: Home / Self Care | Payer: Medicare Other | Source: Ambulatory Visit | Attending: Radiation Oncology | Admitting: Radiation Oncology

## 2011-04-05 ENCOUNTER — Ambulatory Visit
Admission: RE | Admit: 2011-04-05 | Discharge: 2011-04-05 | Disposition: A | Discharge status: Home / Self Care | Payer: Medicare Other | Source: Ambulatory Visit | Attending: Radiation Oncology | Admitting: Radiation Oncology

## 2011-04-06 ENCOUNTER — Ambulatory Visit
Admission: RE | Admit: 2011-04-06 | Discharge: 2011-04-06 | Disposition: A | Discharge status: Home / Self Care | Payer: Medicare Other | Source: Ambulatory Visit | Attending: Radiation Oncology | Admitting: Radiation Oncology

## 2011-04-06 VITALS — Wt 171.6 lb

## 2011-04-06 DIAGNOSIS — C50519 Malignant neoplasm of lower-outer quadrant of unspecified female breast: Secondary | ICD-10-CM

## 2011-04-06 NOTE — Progress Notes (Signed)
Department of Radiation Oncology  Phone:  [phone] Fax:        [phone]  Weekly Treatment Note    Name: [name] Date: 04/06/2011 MRN: [id number] DOB: 10-Jun-1940   Current dose: 9 gray  Current fraction: 5   MEDICATIONS: Current Outpatient Prescriptions  Medication Sig Dispense Refill  . aspirin 81 MG tablet Take 81 mg by mouth daily before breakfast.       . BD PEN NEEDLE NANO U/F 32G X 4 MM MISC       . carvedilol (COREG) 12.5 MG tablet Take 6.25 mg by mouth 2 (two) times daily with a meal.       . cholecalciferol (VITAMIN D) 1000 UNITS tablet Take 1,000 Units by mouth daily.        . cholestyramine (QUESTRAN) 4 G packet 1 packet PO BID, dissolved in beverage and taken with meals, prn diarrhea  30 each  1  . dexamethasone (DECADRON) 4 MG tablet Take 2 tablets two times a day the day before Taxotere. Then take 2 tabs two times a day starting the day after chemo for 3 days.  30 tablet  1  . LEVEMIR FLEXPEN 100 UNIT/ML injection Inject 60-100 Units into the skin daily after breakfast. 100 in the am and 60 at night      . lidocaine-prilocaine (EMLA) cream Apply topically as needed.  30 g  0  . lisinopril (PRINIVIL,ZESTRIL) 40 MG tablet Take 40 mg by mouth daily after breakfast.       . LIVALO 2 MG TABS Take 2 mg by mouth Daily.       Marland Kitchen LORazepam (ATIVAN) 0.5 MG tablet Take 1 tablet (0.5 mg total) by mouth 2 (two) times daily as needed (anxiety and/or nausea).  30 tablet  0  . Omega-3 Fatty Acids (FISH OIL) 1200 MG CAPS Take 1 capsule by mouth 2 (two) times daily.        . ondansetron (ZOFRAN) 8 MG tablet Take 1 tablet two times a day starting the day after chemo for 3 days. Then take 1 tab two times a day as needed for nausea or vomiting.  30 tablet  1  . ondansetron (ZOFRAN-ODT) 8 MG disintegrating tablet Take 8 mg by mouth Twice daily as needed.      . pantoprazole (PROTONIX) 40 MG tablet Take 40 mg by mouth daily.       Marland Kitchen PARoxetine (PAXIL) 20 MG tablet Take 20  mg by mouth every morning.       . prochlorperazine (COMPAZINE) 10 MG tablet Take 1 tablet (10 mg total) by mouth every 6 (six) hours as needed (Nausea or vomiting).  30 tablet  1  . sitaGLIPtin (JANUVIA) 50 MG tablet Take 50 mg by mouth daily.       . sodium chloride 0.9 % SOLN Inject into the vein. Chemo on Monday thru [facility]. Does not know what she recieved      . SYNTHROID 50 MCG tablet Take 50 mcg by mouth daily before breakfast.       . tobramycin-dexamethasone (TOBRADEX) ophthalmic solution       . WELCHOL 625 MG tablet Take 1,250 mg by mouth 2 (two) times daily with a meal.          ALLERGIES: Codeine; Antihistamines, diphenhydramine-type; and Erythromycin   LABORATORY DATA:  Lab Results  Component Value Date   WBC 16.2* 03/12/2011   HGB 13.7 03/12/2011   HCT 39.7 03/12/2011  MCV 89.1 03/12/2011   PLT 219 03/12/2011   Lab Results  Component Value Date   NA 133* 03/05/2011   K 4.4 03/05/2011   CL 98 03/05/2011   CO2 24 03/05/2011   Lab Results  Component Value Date   ALT 25 03/05/2011   AST 19 03/05/2011   ALKPHOS 86 03/05/2011   BILITOT 0.4 03/05/2011     NARRATIVE: [name] was seen today for weekly treatment management. The chart was checked and the patient's films were reviewed. The patient is doing well. She has completed her first week of treatment. She has no complaints.  PHYSICAL EXAMINATION: weight is 171 lb 9.6 oz (77.837 kg).        ASSESSMENT: The patient is doing satisfactorily with treatment.  PLAN: We will continue with the patient's radiation treatment as planned.

## 2011-04-06 NOTE — Progress Notes (Signed)
Here for routine weekly MD assessment of radiation to right breast. Has completed 5 of 35 radiation treatments. No skin changes visible.Denies pain. No questions or concerns. Will print appointment calendar.

## 2011-04-09 ENCOUNTER — Ambulatory Visit
Admission: RE | Admit: 2011-04-09 | Discharge: 2011-04-09 | Disposition: A | Discharge status: Home / Self Care | Payer: Medicare Other | Source: Ambulatory Visit | Attending: Radiation Oncology | Admitting: Radiation Oncology

## 2011-04-10 ENCOUNTER — Ambulatory Visit
Admission: RE | Admit: 2011-04-10 | Discharge: 2011-04-10 | Disposition: A | Discharge status: Home / Self Care | Payer: Medicare Other | Source: Ambulatory Visit | Attending: Radiation Oncology | Admitting: Radiation Oncology

## 2011-04-10 ENCOUNTER — Encounter (INDEPENDENT_AMBULATORY_CARE_PROVIDER_SITE_OTHER): Payer: Self-pay | Admitting: General Surgery

## 2011-04-10 ENCOUNTER — Encounter (HOSPITAL_BASED_OUTPATIENT_CLINIC_OR_DEPARTMENT_OTHER): Payer: Self-pay | Admitting: *Deleted

## 2011-04-10 ENCOUNTER — Ambulatory Visit (INDEPENDENT_AMBULATORY_CARE_PROVIDER_SITE_OTHER): Payer: Medicare Other | Admitting: General Surgery

## 2011-04-10 VITALS — BP 152/90 | HR 60 | Temp 97.6°F | Resp 16 | Ht 63.0 in | Wt 170.1 lb

## 2011-04-10 DIAGNOSIS — C50519 Malignant neoplasm of lower-outer quadrant of unspecified female breast: Secondary | ICD-10-CM

## 2011-04-10 NOTE — Progress Notes (Signed)
Patient ID: [name], female   DOB: 17-Aug-1940, 71 y.o.   MRN: [id number]  Chief Complaint  Patient presents with  . Routine Post Op    recheck [facility] and discuss surgery     HPI [name] is a 71 y.o. female.   HPI 47 yof known to me from lumpectomy/snbx for breast cancer.  She elected to attempt chemotherapy after discussing with Dr. Jana [name] and I placed a port.  She is having significant side effects and they have decided to stop chemo.  She is referred back as she would like to have port removed now. She is in the second week of radiation therapy right now. She reports no complaints and is otherwise doing well.  Past Medical History  Diagnosis Date  . Elevated cholesterol   . GERD (gastroesophageal reflux disease)   . Thyroid disease   . Hypertension   . Coronary artery disease     stent 2007  . Depression   . Diabetes mellitus   . Hypothyroidism   . Cancer     right breast  . 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 Arthritis   . Carcinoma of breast treated with adjuvant chemotherapy     Past Surgical History  Procedure Date  . Cholecystectomy   . Knee surgery 2007  . Carotid stent 2007  . Carpal tunnel release 9/12,8,12    both done  . Dilation and curettage of uterus   . Coronary angioplasty   . Multiple extractions with alveoloplasty   . Foot spurs REMOVED  . Breast lumpectomy 1983    benign biopsy  . Breast surgery 12/29/2010    R lumpectomy, snbx, ER/PR +, Her2 -, 0/1 node pos.  . Portacath placement 02/07/2011    Procedure: INSERTION PORT-A-CATH;  Surgeon: [name] Bookbinder, MD;  Location: [facility];  Service: General;  Laterality: N/A;    Family History  Problem Relation Age of Onset  . Cancer Son   . Cancer Father     throat  . Cancer Brother 45    lymphoma    Social History History  Substance Use Topics  . Smoking status: Former Smoker -- 2.0 packs/day for 40 years    Quit date: 12/19/1997  .  Smokeless tobacco: Former Systems developer    Quit date: 01/22/1997  . Alcohol Use: 3.6 oz/week    6 Glasses of wine per week     occassional    Allergies  Allergen Reactions  . Codeine Nausea And Vomiting and Other (See Comments)    Severe stomach cramps  . Antihistamines, Diphenhydramine-Type Other (See Comments)    Causes hyperactivity  . Erythromycin Hives and Rash    Due to dental work about 50 years ago. Was used in packing and resulted in rash/hives inside and outside of mouth.    Current Outpatient Prescriptions  Medication Sig Dispense Refill  . BD PEN NEEDLE NANO U/F 32G X 4 MM MISC       . carvedilol (COREG) 12.5 MG tablet Take 12.5 mg by mouth 2 (two) times daily with a meal.      . cholestyramine (QUESTRAN) 4 G packet 1 packet PO BID, dissolved in beverage and taken with meals, prn diarrhea  30 each  1  . levothyroxine (SYNTHROID, LEVOTHROID) 50 MCG tablet Take 50 mcg by mouth daily.      Marland Kitchen lisinopril (PRINIVIL,ZESTRIL) 40 MG tablet Take 40 mg by mouth daily.      Marland Kitchen  LORazepam (ATIVAN) 0.5 MG tablet Take 1 tablet (0.5 mg total) by mouth 2 (two) times daily as needed (anxiety and/or nausea).  30 tablet  0  . ondansetron (ZOFRAN-ODT) 8 MG disintegrating tablet Take 8 mg by mouth Twice daily as needed.      . pantoprazole (PROTONIX) 40 MG tablet Take 40 mg by mouth daily.      Marland Kitchen PARoxetine (PAXIL) 20 MG tablet Take 20 mg by mouth every morning.      . prochlorperazine (COMPAZINE) 10 MG tablet Take 1 tablet (10 mg total) by mouth every 6 (six) hours as needed (Nausea or vomiting).  30 tablet  1  . tobramycin-dexamethasone (TOBRADEX) ophthalmic solution       . dexamethasone (DECADRON) 4 MG tablet Take 2 tablets two times a day the day before Taxotere. Then take 2 tabs two times a day starting the day after chemo for 3 days.  30 tablet  1  . lidocaine-prilocaine (EMLA) cream Apply topically as needed.  30 g  0  . ondansetron (ZOFRAN) 8 MG tablet Take 1 tablet two times a day starting the  day after chemo for 3 days. Then take 1 tab two times a day as needed for nausea or vomiting.  30 tablet  1  . sodium chloride 0.9 % SOLN Inject into the vein. Chemo on Monday thru [facility]. Does not know what she recieved        Review of Systems Review of Systems  Blood pressure 152/90, pulse 60, temperature 97.6 F (36.4 C), temperature source Temporal, resp. rate 16, height 5\' 3"  (1.6 m), weight 170 lb 2 oz (77.168 kg).  Physical Exam Physical Exam  Vitals reviewed. Constitutional: She appears well-developed and well-nourished.  Neck: Neck supple.  Cardiovascular: Normal rate, regular rhythm and normal heart sounds.   Pulmonary/Chest: Effort normal and breath sounds normal. She has no wheezes. She has no rales.    Lymphadenopathy:    She has no cervical adenopathy.      Assessment    History breast cancer s/p lumpectomy, snbx    Plan    I placed port for chemotherapy which has now been stopped due to side effects. She is currently undergoing radiation therapy.  She would like to have port out now.  We discussed port removal under local with risks associated with that.  Will plan on doing next week.        [name] 04/10/2011, 10:11 PM

## 2011-04-10 NOTE — Progress Notes (Signed)
Pt could not tolerate chemo-pac out-doing radiation Had pac in 1/13 Will need [facility] if needed

## 2011-04-11 ENCOUNTER — Ambulatory Visit
Admission: RE | Admit: 2011-04-11 | Discharge: 2011-04-11 | Disposition: A | Discharge status: Home / Self Care | Payer: Medicare Other | Source: Ambulatory Visit | Attending: Radiation Oncology | Admitting: Radiation Oncology

## 2011-04-12 ENCOUNTER — Encounter: Payer: Self-pay | Admitting: Radiation Oncology

## 2011-04-12 ENCOUNTER — Ambulatory Visit
Admission: RE | Admit: 2011-04-12 | Discharge: 2011-04-12 | Disposition: A | Discharge status: Home / Self Care | Payer: Medicare Other | Source: Ambulatory Visit | Attending: Radiation Oncology | Admitting: Radiation Oncology

## 2011-04-12 VITALS — Wt 170.5 lb

## 2011-04-12 DIAGNOSIS — C50519 Malignant neoplasm of lower-outer quadrant of unspecified female breast: Secondary | ICD-10-CM

## 2011-04-12 NOTE — Progress Notes (Signed)
Department of Radiation Oncology  Phone:  [phone] Fax:        [phone]  Weekly Treatment Note    Name: [name] Date: 04/12/2011 MRN: [id number] DOB: September 19, 1940   Current dose: 16.2 gray  Current fraction: 9   MEDICATIONS: Current Outpatient Prescriptions  Medication Sig Dispense Refill  . BD PEN NEEDLE NANO U/F 32G X 4 MM MISC       . carvedilol (COREG) 12.5 MG tablet Take 12.5 mg by mouth 2 (two) times daily with a meal.      . cholestyramine (QUESTRAN) 4 G packet 1 packet PO BID, dissolved in beverage and taken with meals, prn diarrhea  30 each  1  . dexamethasone (DECADRON) 4 MG tablet Take 2 tablets two times a day the day before Taxotere. Then take 2 tabs two times a day starting the day after chemo for 3 days.  30 tablet  1  . JANUVIA 50 MG tablet       . LEVEMIR FLEXPEN 100 UNIT/ML injection       . levothyroxine (SYNTHROID, LEVOTHROID) 50 MCG tablet Take 50 mcg by mouth daily.      Marland Kitchen lidocaine-prilocaine (EMLA) cream Apply topically as needed.  30 g  0  . lisinopril (PRINIVIL,ZESTRIL) 40 MG tablet Take 40 mg by mouth daily.      Marland Kitchen LIVALO 2 MG TABS       . LORazepam (ATIVAN) 0.5 MG tablet Take 1 tablet (0.5 mg total) by mouth 2 (two) times daily as needed (anxiety and/or nausea).  30 tablet  0  . ondansetron (ZOFRAN) 8 MG tablet Take 1 tablet two times a day starting the day after chemo for 3 days. Then take 1 tab two times a day as needed for nausea or vomiting.  30 tablet  1  . ondansetron (ZOFRAN-ODT) 8 MG disintegrating tablet Take 8 mg by mouth Twice daily as needed.      . pantoprazole (PROTONIX) 40 MG tablet Take 40 mg by mouth daily.      Marland Kitchen PARoxetine (PAXIL) 20 MG tablet Take 20 mg by mouth every morning.      . prochlorperazine (COMPAZINE) 10 MG tablet Take 1 tablet (10 mg total) by mouth every 6 (six) hours as needed (Nausea or vomiting).  30 tablet  1  . sodium chloride 0.9 % SOLN Inject into the vein. Chemo on Monday thru [facility]. Does not know what she recieved      . tobramycin-dexamethasone (TOBRADEX) ophthalmic solution          ALLERGIES: Codeine; Antihistamines, diphenhydramine-type; and Erythromycin   LABORATORY DATA:  Lab Results  Component Value Date   WBC 16.2* 03/12/2011   HGB 13.7 03/12/2011   HCT 39.7 03/12/2011   MCV 89.1 03/12/2011   PLT 219 03/12/2011   Lab Results  Component Value Date   NA 133* 03/05/2011   K 4.4 03/05/2011   CL 98 03/05/2011   CO2 24 03/05/2011   Lab Results  Component Value Date   ALT 25 03/05/2011   AST 19 03/05/2011   ALKPHOS 86 03/05/2011   BILITOT 0.4 03/05/2011     NARRATIVE: [name] was seen today for weekly treatment management. The chart was checked and the patient's films were reviewed. The patient is doing very well. She is notice a little bit of redness centrally near the nipple area or complex. She is also describing some occasional brief pain laterally within the breast  PHYSICAL EXAMINATION: weight is 170 lb 8 oz (77.338 kg).      the skin looks 3 good at this point with minimal erythema diffusely.  ASSESSMENT: The patient is doing satisfactorily with treatment.  PLAN: We will continue with the patient's radiation treatment as planned. The patient it appears is having some shooting pains as well and I reassured her that this is normal and we'll get better over time. Her skin looks excellent.

## 2011-04-12 NOTE — Progress Notes (Signed)
Pt states her "R nipple is a little red". Applying [employer].

## 2011-04-13 ENCOUNTER — Ambulatory Visit
Admission: RE | Admit: 2011-04-13 | Discharge: 2011-04-13 | Disposition: A | Discharge status: Home / Self Care | Payer: Medicare Other | Source: Ambulatory Visit | Attending: Radiation Oncology | Admitting: Radiation Oncology

## 2011-04-16 ENCOUNTER — Ambulatory Visit: Payer: Medicare Other

## 2011-04-16 ENCOUNTER — Ambulatory Visit
Admission: RE | Admit: 2011-04-16 | Discharge: 2011-04-16 | Disposition: A | Discharge status: Home / Self Care | Payer: Medicare Other | Source: Ambulatory Visit | Attending: Radiation Oncology | Admitting: Radiation Oncology

## 2011-04-16 ENCOUNTER — Other Ambulatory Visit: Payer: Medicare Other | Admitting: Lab

## 2011-04-16 ENCOUNTER — Ambulatory Visit: Payer: Medicare Other | Admitting: Oncology

## 2011-04-17 ENCOUNTER — Ambulatory Visit: Payer: Medicare Other

## 2011-04-17 ENCOUNTER — Encounter (HOSPITAL_BASED_OUTPATIENT_CLINIC_OR_DEPARTMENT_OTHER): Payer: Self-pay | Admitting: Anesthesiology

## 2011-04-17 ENCOUNTER — Encounter (HOSPITAL_BASED_OUTPATIENT_CLINIC_OR_DEPARTMENT_OTHER): Payer: Self-pay

## 2011-04-17 ENCOUNTER — Ambulatory Visit (HOSPITAL_BASED_OUTPATIENT_CLINIC_OR_DEPARTMENT_OTHER): Payer: Medicare Other | Admitting: Anesthesiology

## 2011-04-17 ENCOUNTER — Ambulatory Visit
Admission: RE | Admit: 2011-04-17 | Discharge: 2011-04-17 | Disposition: A | Discharge status: Home / Self Care | Payer: Medicare Other | Source: Ambulatory Visit | Attending: Radiation Oncology | Admitting: Radiation Oncology

## 2011-04-17 ENCOUNTER — Encounter (HOSPITAL_BASED_OUTPATIENT_CLINIC_OR_DEPARTMENT_OTHER)
Admission: RE | Disposition: A | Discharge status: Home / Self Care | Payer: Self-pay | Source: Ambulatory Visit | Attending: General Surgery

## 2011-04-17 ENCOUNTER — Encounter (HOSPITAL_BASED_OUTPATIENT_CLINIC_OR_DEPARTMENT_OTHER): Payer: Self-pay | Admitting: Certified Registered"

## 2011-04-17 ENCOUNTER — Ambulatory Visit (HOSPITAL_BASED_OUTPATIENT_CLINIC_OR_DEPARTMENT_OTHER)
Admission: RE | Admit: 2011-04-17 | Discharge: 2011-04-17 | Disposition: A | Discharge status: Home / Self Care | Payer: Medicare Other | Source: Ambulatory Visit | Attending: General Surgery | Admitting: General Surgery

## 2011-04-17 DIAGNOSIS — F3289 Other specified depressive episodes: Secondary | ICD-10-CM | POA: Insufficient documentation

## 2011-04-17 DIAGNOSIS — E039 Hypothyroidism, unspecified: Secondary | ICD-10-CM | POA: Insufficient documentation

## 2011-04-17 DIAGNOSIS — E119 Type 2 diabetes mellitus without complications: Secondary | ICD-10-CM | POA: Insufficient documentation

## 2011-04-17 DIAGNOSIS — Z452 Encounter for adjustment and management of vascular access device: Secondary | ICD-10-CM | POA: Insufficient documentation

## 2011-04-17 DIAGNOSIS — I739 Peripheral vascular disease, unspecified: Secondary | ICD-10-CM | POA: Insufficient documentation

## 2011-04-17 DIAGNOSIS — C50919 Malignant neoplasm of unspecified site of unspecified female breast: Secondary | ICD-10-CM | POA: Insufficient documentation

## 2011-04-17 DIAGNOSIS — I2510 Atherosclerotic heart disease of native coronary artery without angina pectoris: Secondary | ICD-10-CM | POA: Insufficient documentation

## 2011-04-17 DIAGNOSIS — F329 Major depressive disorder, single episode, unspecified: Secondary | ICD-10-CM | POA: Insufficient documentation

## 2011-04-17 DIAGNOSIS — K219 Gastro-esophageal reflux disease without esophagitis: Secondary | ICD-10-CM | POA: Insufficient documentation

## 2011-04-17 DIAGNOSIS — I10 Essential (primary) hypertension: Secondary | ICD-10-CM | POA: Insufficient documentation

## 2011-04-17 HISTORY — PX: PORT-A-CATH REMOVAL: SHX5289

## 2011-04-17 LAB — POCT I-STAT, CHEM 8
BUN: 13 mg/dL (ref 6–23)
Calcium, Ion: 1.12 mmol/L (ref 1.12–1.32)
Chloride: 107 mEq/L (ref 96–112)
Creatinine, Ser: 0.9 mg/dL (ref 0.50–1.10)
Sodium: 140 mEq/L (ref 135–145)
TCO2: 24 mmol/L (ref 0–100)

## 2011-04-17 LAB — GLUCOSE, CAPILLARY: Glucose-Capillary: 175 mg/dL — ABNORMAL HIGH (ref 70–99)

## 2011-04-17 LAB — POCT HEMOGLOBIN-HEMACUE: Hemoglobin: 10.8 g/dL — ABNORMAL LOW (ref 12.0–15.0)

## 2011-04-17 SURGERY — REMOVAL PORT-A-CATH
Anesthesia: Monitor Anesthesia Care | Site: Chest | Laterality: Left | Wound class: Clean

## 2011-04-17 MED ORDER — FENTANYL CITRATE 0.05 MG/ML IJ SOLN: 25.0000 ug | INTRAMUSCULAR | Status: DC | PRN

## 2011-04-17 MED ORDER — FENTANYL CITRATE 0.05 MG/ML IJ SOLN
INTRAMUSCULAR | Status: DC | PRN
  Administered 2011-04-17: 50 ug via INTRAVENOUS

## 2011-04-17 MED ORDER — LIDOCAINE HCL (CARDIAC) 20 MG/ML IV SOLN
INTRAVENOUS | Status: DC | PRN
  Administered 2011-04-17: 80 mg via INTRAVENOUS

## 2011-04-17 MED ORDER — METOCLOPRAMIDE HCL 5 MG/ML IJ SOLN: 10.0000 mg | Freq: Once | INTRAMUSCULAR | Status: DC | PRN

## 2011-04-17 MED ORDER — BUPIVACAINE HCL (PF) 0.25 % IJ SOLN
INTRAMUSCULAR | Status: DC | PRN
  Administered 2011-04-17: 10 mL

## 2011-04-17 MED ORDER — PROPOFOL 10 MG/ML IV EMUL
INTRAVENOUS | Status: DC | PRN
  Administered 2011-04-17: 140 ug/kg/min via INTRAVENOUS

## 2011-04-17 MED ORDER — ONDANSETRON HCL 4 MG/2ML IJ SOLN
INTRAMUSCULAR | Status: DC | PRN
  Administered 2011-04-17: 4 mg via INTRAVENOUS

## 2011-04-17 MED ORDER — HYDROCODONE-ACETAMINOPHEN 5-325 MG PO TABS: 1.0000 | ORAL_TABLET | ORAL | Status: AC | PRN

## 2011-04-17 MED ORDER — MIDAZOLAM HCL 5 MG/5ML IJ SOLN
INTRAMUSCULAR | Status: DC | PRN
  Administered 2011-04-17: 1 mg via INTRAVENOUS

## 2011-04-17 MED ORDER — LACTATED RINGERS IV SOLN: INTRAVENOUS | Status: DC

## 2011-04-17 SURGICAL SUPPLY — 33 items
BLADE SURG 15 STRL LF DISP TIS (BLADE) ×1 IMPLANT
BLADE SURG 15 STRL SS (BLADE) ×1
CHLORAPREP W/TINT 26ML (MISCELLANEOUS) ×2 IMPLANT
CLOTH BEACON ORANGE TIMEOUT ST (SAFETY) ×2 IMPLANT
COVER MAYO STAND STRL (DRAPES) ×2 IMPLANT
COVER TABLE BACK 60X90 (DRAPES) ×2 IMPLANT
DECANTER SPIKE VIAL GLASS SM (MISCELLANEOUS) ×2 IMPLANT
DERMABOND ADVANCED (GAUZE/BANDAGES/DRESSINGS) ×1
DERMABOND ADVANCED .7 DNX12 (GAUZE/BANDAGES/DRESSINGS) ×1 IMPLANT
DRAPE PED LAPAROTOMY (DRAPES) ×2 IMPLANT
ELECT COATED BLADE 2.86 ST (ELECTRODE) ×2 IMPLANT
ELECT REM PT RETURN 9FT ADLT (ELECTROSURGICAL) ×2
ELECTRODE REM PT RTRN 9FT ADLT (ELECTROSURGICAL) ×1 IMPLANT
GLOVE BIO SURGEON STRL SZ7 (GLOVE) ×2 IMPLANT
GLOVE BIOGEL PI IND STRL 8 (GLOVE) ×1 IMPLANT
GLOVE BIOGEL PI INDICATOR 8 (GLOVE) ×1
GLOVE INDICATOR 6.5 STRL GRN (GLOVE) ×2 IMPLANT
GLOVE SKINSENSE NS SZ6.5 (GLOVE) ×1
GLOVE SKINSENSE STRL SZ6.5 (GLOVE) ×1 IMPLANT
GLOVE SURG SS PI 8.0 STRL IVOR (GLOVE) ×2 IMPLANT
GOWN PREVENTION PLUS XLARGE (GOWN DISPOSABLE) ×4 IMPLANT
GOWN PREVENTION PLUS XXLARGE (GOWN DISPOSABLE) ×2 IMPLANT
NEEDLE HYPO 25X1 1.5 SAFETY (NEEDLE) ×2 IMPLANT
PACK BASIN DAY SURGERY FS (CUSTOM PROCEDURE TRAY) ×2 IMPLANT
PENCIL BUTTON HOLSTER BLD 10FT (ELECTRODE) ×2 IMPLANT
SLEEVE SCD COMPRESS KNEE MED (MISCELLANEOUS) ×2 IMPLANT
SUT MON AB 4-0 PC3 18 (SUTURE) ×2 IMPLANT
SUT VIC AB 3-0 SH 27 (SUTURE) ×1
SUT VIC AB 3-0 SH 27X BRD (SUTURE) ×1 IMPLANT
SYR CONTROL 10ML LL (SYRINGE) ×2 IMPLANT
TOWEL OR 17X24 6PK STRL BLUE (TOWEL DISPOSABLE) ×2 IMPLANT
TOWEL OR NON WOVEN STRL DISP B (DISPOSABLE) ×2 IMPLANT
WATER STERILE IRR 1000ML POUR (IV SOLUTION) ×2 IMPLANT

## 2011-04-17 NOTE — H&P (View-Only) (Signed)
Patient ID: [name], female   DOB: [birth year], 71 y.o.   MRN: [id number]  Chief Complaint  Patient presents with  . Routine Post Op    recheck [facility] and discuss surgery     [name] is a 71 y.o. female.   [name] 17 yof known to me from lumpectomy/snbx for breast cancer.  She elected to attempt chemotherapy after discussing with Dr. Jana Hakim and I placed a port.  She is having significant side effects and they have decided to stop chemo.  She is referred back as she would like to have port removed now. She is in the second week of radiation therapy right now. She reports no complaints and is otherwise doing well.  Past Medical History  Diagnosis Date  . Elevated cholesterol   . GERD (gastroesophageal reflux disease)   . Thyroid disease   . Hypertension   . Coronary artery disease     stent 2007  . Depression   . Diabetes mellitus   . Hypothyroidism   . Cancer     right breast  . 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 Arthritis   . Carcinoma of breast treated with adjuvant chemotherapy     Past Surgical History  Procedure Date  . Cholecystectomy   . Knee surgery 2007  . Carotid stent 2007  . Carpal tunnel release 9/12,8,12    both done  . Dilation and curettage of uterus   . Coronary angioplasty   . Multiple extractions with alveoloplasty   . Foot spurs REMOVED  . Breast lumpectomy 1983    benign biopsy  . Breast surgery 12/29/2010    R lumpectomy, snbx, ER/PR +, Her2 -, 0/1 node pos.  . Portacath placement 02/07/2011    Procedure: INSERTION PORT-A-CATH;  Surgeon: [name], MD;  Location: [facility];  Service: General;  Laterality: N/A;    Family History  Problem Relation Age of Onset  . Cancer Son   . Cancer Father     throat  . Cancer Brother 24    lymphoma    Social History History  Substance Use Topics  . Smoking status: Former Smoker -- 2.0 packs/day for 40 years    Quit date: 12/19/1997  .  Smokeless tobacco: Former Systems developer    Quit date: 01/22/1997  . Alcohol Use: 3.6 oz/week    6 Glasses of wine per week     occassional    Allergies  Allergen Reactions  . Codeine Nausea And Vomiting and Other (See Comments)    Severe stomach cramps  . Antihistamines, Diphenhydramine-Type Other (See Comments)    Causes hyperactivity  . Erythromycin Hives and Rash    Due to dental work about 50 years ago. Was used in packing and resulted in rash/hives inside and outside of mouth.    Current Outpatient Prescriptions  Medication Sig Dispense Refill  . BD PEN NEEDLE NANO U/F 32G X 4 MM MISC       . carvedilol (COREG) 12.5 MG tablet Take 12.5 mg by mouth 2 (two) times daily with a meal.      . cholestyramine (QUESTRAN) 4 G packet 1 packet PO BID, dissolved in beverage and taken with meals, prn diarrhea  30 each  1  . levothyroxine (SYNTHROID, LEVOTHROID) 50 MCG tablet Take 50 mcg by mouth daily.      Marland Kitchen lisinopril (PRINIVIL,ZESTRIL) 40 MG tablet Take 40 mg by mouth daily.      Marland Kitchen  LORazepam (ATIVAN) 0.5 MG tablet Take 1 tablet (0.5 mg total) by mouth 2 (two) times daily as needed (anxiety and/or nausea).  30 tablet  0  . ondansetron (ZOFRAN-ODT) 8 MG disintegrating tablet Take 8 mg by mouth Twice daily as needed.      . pantoprazole (PROTONIX) 40 MG tablet Take 40 mg by mouth daily.      Marland Kitchen PARoxetine (PAXIL) 20 MG tablet Take 20 mg by mouth every morning.      . prochlorperazine (COMPAZINE) 10 MG tablet Take 1 tablet (10 mg total) by mouth every 6 (six) hours as needed (Nausea or vomiting).  30 tablet  1  . tobramycin-dexamethasone (TOBRADEX) ophthalmic solution       . dexamethasone (DECADRON) 4 MG tablet Take 2 tablets two times a day the day before Taxotere. Then take 2 tabs two times a day starting the day after chemo for 3 days.  30 tablet  1  . lidocaine-prilocaine (EMLA) cream Apply topically as needed.  30 g  0  . ondansetron (ZOFRAN) 8 MG tablet Take 1 tablet two times a day starting the  day after chemo for 3 days. Then take 1 tab two times a day as needed for nausea or vomiting.  30 tablet  1  . sodium chloride 0.9 % SOLN Inject into the vein. Chemo on Monday thru [facility]. Does not know what she recieved        Review of Systems Review of Systems  Blood pressure 152/90, pulse 60, temperature 97.6 F (36.4 C), temperature source Temporal, resp. rate 16, height 5\' 3"  (1.6 m), weight 170 lb 2 oz (77.168 kg).  Physical Exam Physical Exam  Vitals reviewed. Constitutional: She appears well-developed and well-nourished.  Neck: Neck supple.  Cardiovascular: Normal rate, regular rhythm and normal heart sounds.   Pulmonary/Chest: Effort normal and breath sounds normal. She has no wheezes. She has no rales.    Lymphadenopathy:    She has no cervical adenopathy.      Assessment    History breast cancer s/p lumpectomy, snbx    Plan    I placed port for chemotherapy which has now been stopped due to side effects. She is currently undergoing radiation therapy.  She would like to have port out now.  We discussed port removal under local with risks associated with that.  Will plan on doing next week.        [name] 04/10/2011, 10:11 PM

## 2011-04-17 NOTE — Anesthesia Postprocedure Evaluation (Signed)
Anesthesia Post Note  Patient: [name]  Procedure(s) Performed: Procedure(s) (LRB): REMOVAL PORT-A-CATH (Left)  Anesthesia type: MAC  Patient location: PACU  Post pain: Pain level controlled  Post assessment: Patient's Cardiovascular Status Stable  Last Vitals:  Filed Vitals:   04/17/11 0858  BP: 147/68  Pulse: 73  Temp: 36.4 C  Resp: 18    Post vital signs: Reviewed and stable  Level of consciousness: alert  Complications: No apparent anesthesia complications

## 2011-04-17 NOTE — Discharge Instructions (Signed)
[place] Office Phone Number [phone]  POST OP INSTRUCTIONS  Always review your discharge instruction sheet given to you by the facility where your surgery was performed.  IF YOU HAVE DISABILITY OR FAMILY LEAVE FORMS, YOU MUST BRING THEM TO THE OFFICE FOR PROCESSING.  DO NOT GIVE THEM TO YOUR DOCTOR.  1. A prescription for pain medication may be given to you upon discharge.  Take your pain medication as prescribed, if needed.  If narcotic pain medicine is not needed, then you may take acetaminophen (Tylenol), naprosyn (Alleve) or ibuprofen (Advil) as needed. 2. Take your usually prescribed medications unless otherwise directed 3. If you need a refill on your pain medication, please contact your pharmacy.  They will contact our office to request authorization.  Prescriptions will not be filled after 5pm or on week-ends. 4. You should eat very light the first 24 hours after surgery, such as soup, crackers, pudding, etc.  Resume your normal diet the day after surgery. 5. Most patients will experience some swelling and bruising in the chest.  Ice packs and a good support bra will help.  Swelling and bruising can take several days to resolve.  6. It is common to experience some constipation if taking pain medication after surgery.  Increasing fluid intake and taking a stool softener will usually help or prevent this problem from occurring.  A mild laxative (Milk of Magnesia or Miralax) should be taken according to package directions if there are no bowel movements after 48 hours. 7.  If your surgeon used skin glue on the incision, you may shower in 24 hours.  The glue will flake off over the next 2-3 weeks.  Any sutures or staples will be removed at the office during your follow-up visit. 8. ACTIVITIES:  You may resume regular daily activities (gradually increasing) beginning the next day.  Wearing a good support bra or sports bra minimizes pain and swelling.  You may have sexual  intercourse when it is comfortable. a. You may drive when you no longer are taking prescription pain medication, you can comfortably wear a seatbelt, and you can safely maneuver your car and apply brakes. b. RETURN TO WORK:  ______________________________________________________________________________________ 9. You should see your doctor in the office for a follow-up appointment approximately four weeks after your surgery.  Your doctor's nurse will typically make your follow-up appointment when she calls you with your pathology report.  Expect your pathology report 3-4 business days after your surgery.  You may call to check if you do not hear from [place] after three days. 10. OTHER INSTRUCTIONS: _______________________________________________________________________________________________ _____________________________________________________________________________________________________________________________________ _____________________________________________________________________________________________________________________________________ _____________________________________________________________________________________________________________________________________  WHEN TO CALL DR WAKEFIELD: 1. Fever over 101.0 2. Nausea and/or vomiting. 3. Extreme swelling or bruising. 4. Continued bleeding from incision. 5. Increased pain, redness, or drainage from the incision.  The clinic staff is available to answer your questions during regular business hours.  Please don't hesitate to call and ask to speak to one of the nurses for clinical concerns.  If you have a medical emergency, go to the nearest emergency room or call 911.  A surgeon from [facility] is always on call at the hospital.  For further questions, please visit [email or web address]  [place], [place] [address] [phone]    Post Anesthesia Home  Care Instructions  Activity: Get plenty of rest for the remainder of the day. A responsible adult should stay with you for 24 hours following the procedure.  For  the next 24 hours, DO NOT: -Drive a car -Paediatric nurse -Drink alcoholic beverages -Take any medication unless instructed by your physician -Make any legal decisions or sign important papers.  Meals: Start with liquid foods such as gelatin or soup. Progress to regular foods as tolerated. Avoid greasy, spicy, heavy foods. If nausea and/or vomiting occur, drink only clear liquids until the nausea and/or vomiting subsides. Call your physician if vomiting continues.  Special Instructions/Symptoms: Your throat may feel dry or sore from the anesthesia or the breathing tube placed in your throat during surgery. If this causes discomfort, gargle with warm salt water. The discomfort should disappear within 24 hours.

## 2011-04-17 NOTE — Anesthesia Preprocedure Evaluation (Signed)
Anesthesia Evaluation  Patient identified by MRN, date of birth, ID band Patient awake    Reviewed: Allergy & Precautions, H&P , NPO status , Patient's Chart, lab work & pertinent test results, reviewed documented beta blocker date and time   Airway Mallampati: II TM Distance: >3 FB Neck ROM: full    Dental   Pulmonary neg pulmonary ROS,          Cardiovascular hypertension, Pt. on medications and Pt. on home beta blockers + CAD and + Peripheral Vascular Disease     Neuro/Psych PSYCHIATRIC DISORDERS Depression negative neurological ROS     GI/Hepatic negative GI ROS, Neg liver ROS, GERD-  Medicated and Controlled,  Endo/Other  Diabetes mellitus-, Type 2, Oral Hypoglycemic AgentsHypothyroidism   Renal/GU negative Renal ROS  negative genitourinary   Musculoskeletal   Abdominal   Peds  Hematology negative hematology ROS (+)   Anesthesia Other Findings See surgeon's H&P   Reproductive/Obstetrics negative OB ROS                           Anesthesia Physical Anesthesia Plan  ASA: III  Anesthesia Plan: MAC   Post-op Pain Management:    Induction: Intravenous  Airway Management Planned: Simple Face Mask  Additional Equipment:   Intra-op Plan:   Post-operative Plan:   Informed Consent: I have reviewed the patients History and Physical, chart, labs and discussed the procedure including the risks, benefits and alternatives for the proposed anesthesia with the patient or authorized representative who has indicated his/her understanding and acceptance.     Plan Discussed with: CRNA and Surgeon  Anesthesia Plan Comments:         Anesthesia Quick Evaluation

## 2011-04-17 NOTE — Transfer of Care (Signed)
Immediate Anesthesia Transfer of Care Note  Patient: [name]  Procedure(s) Performed: Procedure(s) (LRB): REMOVAL PORT-A-CATH (Left)  Patient Location: PACU  Anesthesia Type: MAC  Level of Consciousness: awake, alert , oriented and patient cooperative  Airway & Oxygen Therapy: Patient Spontanous Breathing and Patient connected to face mask oxygen  Post-op Assessment: Report given to PACU RN and Post -op Vital signs reviewed and stable  Post vital signs: Reviewed and stable  Complications: No apparent anesthesia complications

## 2011-04-17 NOTE — Interval H&P Note (Signed)
History and Physical Interval Note:  04/17/2011 7:23 AM  [name]  has presented today for surgery, with the diagnosis of unneeded port  The various methods of treatment have been discussed with the patient and family. After consideration of risks, benefits and other options for treatment, the patient has consented to  Procedure(s) (LRB): REMOVAL PORT-A-CATH (N/A) as a surgical intervention .  The patients' history has been reviewed, patient examined, no change in status, stable for surgery.  I have reviewed the patients' chart and labs.  Questions were answered to the patient's satisfaction.     [name]

## 2011-04-17 NOTE — Op Note (Signed)
Preoperative diagnosis: breast cancer status post chemotherapy, no longer needs venous access Postoperative diagnosis: Same as above Procedure: Left subclavian port removal Surgeon: Dr. Serita Grammes Anesthesia: Local MAC Specimens: None Drains: None Consultations: None Estimated blood loss: Minimal Sponge count correct at end of operation Disposition to recovery in stable condition  Indications: This is a 71 year old female who I cared for her breast cancer. She began chemotherapy but the decision was then made to stop. She presented to have her port removed. We discussed the risk and benefits associated with that.  Procedure: After informed consent was obtained the patient was taken to the operating room. She was placed under monitored anesthesia care. She was prepped and draped in the standard sterile surgical fashion. A surgical timeout was then performed.  I infiltrated quarter percent Marcaine throughout the region of her port in the left chest. I then entered her old incision. Her port was then removed in its entirety. I removed all the stitch material as well. Hemostasis was observed. I then closed with 3-0 Vicryl and 4-0 Monocryl. Dermabond was placed over this. She tolerated this well was transferred to recovery in stable condition.

## 2011-04-18 ENCOUNTER — Ambulatory Visit
Admission: RE | Admit: 2011-04-18 | Discharge: 2011-04-18 | Disposition: A | Discharge status: Home / Self Care | Payer: Medicare Other | Source: Ambulatory Visit | Attending: Radiation Oncology | Admitting: Radiation Oncology

## 2011-04-19 ENCOUNTER — Ambulatory Visit
Admission: RE | Admit: 2011-04-19 | Discharge: 2011-04-19 | Disposition: A | Discharge status: Home / Self Care | Payer: Medicare Other | Source: Ambulatory Visit | Attending: Radiation Oncology | Admitting: Radiation Oncology

## 2011-04-19 ENCOUNTER — Encounter (HOSPITAL_BASED_OUTPATIENT_CLINIC_OR_DEPARTMENT_OTHER): Payer: Self-pay | Admitting: General Surgery

## 2011-04-19 VITALS — BP 148/80 | HR 87 | Temp 97.0°F | Wt 170.7 lb

## 2011-04-19 DIAGNOSIS — C50519 Malignant neoplasm of lower-outer quadrant of unspecified female breast: Secondary | ICD-10-CM

## 2011-04-19 NOTE — Progress Notes (Signed)
   Department of Radiation Oncology  Phone:  [phone] Fax:        [phone]  Weekly Treatment Note    Name: [name] Date: 04/19/2011 MRN: [id number] DOB: 06-17-1940   Current dose: 25.2 gray  Current fraction: 14   MEDICATIONS: Current Outpatient Prescriptions  Medication Sig Dispense Refill  . BD PEN NEEDLE NANO U/F 32G X 4 MM MISC       . carvedilol (COREG) 12.5 MG tablet Take 12.5 mg by mouth 2 (two) times daily with a meal.      . HYDROcodone-acetaminophen (NORCO) 5-325 MG per tablet Take 1 tablet by mouth every 4 (four) hours as needed for pain.  10 tablet  1  . JANUVIA 50 MG tablet       . LEVEMIR FLEXPEN 100 UNIT/ML injection       . levothyroxine (SYNTHROID, LEVOTHROID) 50 MCG tablet Take 50 mcg by mouth daily.      Marland Kitchen lisinopril (PRINIVIL,ZESTRIL) 40 MG tablet Take 40 mg by mouth daily.      Marland Kitchen LIVALO 2 MG TABS       . pantoprazole (PROTONIX) 40 MG tablet Take 40 mg by mouth daily.      Marland Kitchen PARoxetine (PAXIL) 20 MG tablet Take 20 mg by mouth every morning.      Marland Kitchen DISCONTD: WELCHOL 625 MG tablet Take 1,250 mg by mouth 2 (two) times daily with a meal.          ALLERGIES: Codeine; Antihistamines, diphenhydramine-type; and Erythromycin   LABORATORY DATA:  Lab Results  Component Value Date   WBC 16.2* 03/12/2011   HGB 11.2* 04/17/2011   HCT 33.0* 04/17/2011   MCV 89.1 03/12/2011   PLT 219 03/12/2011   Lab Results  Component Value Date   NA 140 04/17/2011   K 3.8 04/17/2011   CL 107 04/17/2011   CO2 24 03/05/2011   Lab Results  Component Value Date   ALT 25 03/05/2011   AST 19 03/05/2011   ALKPHOS 86 03/05/2011   BILITOT 0.4 03/05/2011     NARRATIVE: [name] was seen today for weekly treatment management. The chart was checked and the patient's films were reviewed. The patient is doing very well. No change in fatigue. She is notice some mild redness in the breast area.  PHYSICAL EXAMINATION: weight is 170 lb 11.2 oz (77.429 kg). Her  temperature is 97 F (36.1 C). Her blood pressure is 148/80 and her pulse is 87.      some mild erythema is present especially centrally in the right breast. Overall her skin looks excellent. No desquamation  ASSESSMENT: The patient is doing satisfactorily with treatment.  PLAN: We will continue with the patient's radiation treatment as planned.

## 2011-04-19 NOTE — Progress Notes (Signed)
[name] in exam room 8.  Mild redness noted in the areola region.  States tenderness on the lateral side of right breast.  Generalized fatigue which was her normal prior to start of radiation therapy.

## 2011-04-20 ENCOUNTER — Ambulatory Visit
Admission: RE | Admit: 2011-04-20 | Discharge: 2011-04-20 | Disposition: A | Discharge status: Home / Self Care | Payer: Medicare Other | Source: Ambulatory Visit | Attending: Radiation Oncology | Admitting: Radiation Oncology

## 2011-04-23 ENCOUNTER — Ambulatory Visit
Admission: RE | Admit: 2011-04-23 | Discharge: 2011-04-23 | Disposition: A | Discharge status: Home / Self Care | Payer: Medicare Other | Source: Ambulatory Visit | Attending: Radiation Oncology | Admitting: Radiation Oncology

## 2011-04-24 ENCOUNTER — Ambulatory Visit
Admission: RE | Admit: 2011-04-24 | Discharge: 2011-04-24 | Disposition: A | Discharge status: Home / Self Care | Payer: Medicare Other | Source: Ambulatory Visit | Attending: Radiation Oncology | Admitting: Radiation Oncology

## 2011-04-24 VITALS — Wt 170.0 lb

## 2011-04-24 DIAGNOSIS — C50519 Malignant neoplasm of lower-outer quadrant of unspecified female breast: Secondary | ICD-10-CM

## 2011-04-24 NOTE — Progress Notes (Signed)
HERE FOR PUT.  SKIN LOOKS GOOD, SOME PINKNESS AROUND NIPPLE AND SKIN A LITTLE DARKER AT SURGICAL SITE UNDER ARM  USING RADIAPLEX.  ALSO HAS SOME FATIGUE

## 2011-04-24 NOTE — Progress Notes (Signed)
Weekly Management Note Current Dose: 30.6 Gy  Projected Dose: 64.4 Gy   Narrative:  The patient presents for routine under treatment assessment.  CBCT/MVCT images/Port film x-rays were reviewed.  The chart was checked. Doing well. Has some residual fatigue from chemo which is stable. Had questions about tanning and if it was ok to cover her breast with a towel  Physical Findings: Weight: 170 lb (77.111 kg). Slightly pink right breast. Axillary skin intact.   Impression:  The patient is tolerating radiation.  Plan:  Continue treatment as planned. Strongly encouraged patient not to use tanning beds.  Encouraged her to use self tanner or spray tan.  Discussed possible increased skin reactions and radiation recall in treated area.

## 2011-04-25 ENCOUNTER — Ambulatory Visit
Admission: RE | Admit: 2011-04-25 | Discharge: 2011-04-25 | Disposition: A | Discharge status: Home / Self Care | Payer: Medicare Other | Source: Ambulatory Visit | Attending: Radiation Oncology | Admitting: Radiation Oncology

## 2011-04-25 ENCOUNTER — Ambulatory Visit: Payer: Medicare Other | Admitting: Physician Assistant

## 2011-04-25 ENCOUNTER — Other Ambulatory Visit: Payer: Medicare Other | Admitting: Lab

## 2011-04-26 ENCOUNTER — Ambulatory Visit
Admission: RE | Admit: 2011-04-26 | Discharge: 2011-04-26 | Disposition: A | Discharge status: Home / Self Care | Payer: Medicare Other | Source: Ambulatory Visit | Attending: Radiation Oncology | Admitting: Radiation Oncology

## 2011-04-27 ENCOUNTER — Ambulatory Visit
Admission: RE | Admit: 2011-04-27 | Discharge: 2011-04-27 | Disposition: A | Discharge status: Home / Self Care | Payer: Medicare Other | Source: Ambulatory Visit | Attending: Radiation Oncology | Admitting: Radiation Oncology

## 2011-04-30 ENCOUNTER — Ambulatory Visit: Payer: Medicare Other | Admitting: Radiation Oncology

## 2011-04-30 ENCOUNTER — Ambulatory Visit
Admission: RE | Admit: 2011-04-30 | Discharge: 2011-04-30 | Disposition: A | Discharge status: Home / Self Care | Payer: Medicare Other | Source: Ambulatory Visit | Attending: Radiation Oncology | Admitting: Radiation Oncology

## 2011-05-01 ENCOUNTER — Ambulatory Visit
Admission: RE | Admit: 2011-05-01 | Discharge: 2011-05-01 | Disposition: A | Discharge status: Home / Self Care | Payer: Medicare Other | Source: Ambulatory Visit | Attending: Radiation Oncology | Admitting: Radiation Oncology

## 2011-05-02 ENCOUNTER — Ambulatory Visit
Admission: RE | Admit: 2011-05-02 | Discharge: 2011-05-02 | Disposition: A | Discharge status: Home / Self Care | Payer: Medicare Other | Source: Ambulatory Visit | Attending: Radiation Oncology | Admitting: Radiation Oncology

## 2011-05-03 ENCOUNTER — Encounter: Payer: Self-pay | Admitting: Radiation Oncology

## 2011-05-03 ENCOUNTER — Ambulatory Visit
Admission: RE | Admit: 2011-05-03 | Discharge: 2011-05-03 | Disposition: A | Discharge status: Home / Self Care | Payer: Medicare Other | Source: Ambulatory Visit | Attending: Radiation Oncology | Admitting: Radiation Oncology

## 2011-05-03 VITALS — Wt 168.9 lb

## 2011-05-03 DIAGNOSIS — C50519 Malignant neoplasm of lower-outer quadrant of unspecified female breast: Secondary | ICD-10-CM

## 2011-05-03 NOTE — Progress Notes (Signed)
Pt denies pain, is fatigued. Applying Radiaplex to skin right breast/axilla.

## 2011-05-03 NOTE — Progress Notes (Signed)
   Department of Radiation Oncology  Phone:  [phone] Fax:        [phone]  Weekly Treatment Note    Name: [name] Date: 05/03/2011 MRN: [id number] DOB: 04-08-1940   Current dose: 43.2 gray  Current fraction: 24   MEDICATIONS: Current Outpatient Prescriptions  Medication Sig Dispense Refill  . BD PEN NEEDLE NANO U/F 32G X 4 MM MISC       . carvedilol (COREG) 12.5 MG tablet Take 12.5 mg by mouth 2 (two) times daily with a meal.      . JANUVIA 50 MG tablet       . LEVEMIR FLEXPEN 100 UNIT/ML injection       . levothyroxine (SYNTHROID, LEVOTHROID) 50 MCG tablet Take 50 mcg by mouth daily.      Marland Kitchen lisinopril (PRINIVIL,ZESTRIL) 40 MG tablet Take 40 mg by mouth daily.      Marland Kitchen LIVALO 2 MG TABS       . pantoprazole (PROTONIX) 40 MG tablet Take 40 mg by mouth daily.      Marland Kitchen PARoxetine (PAXIL) 20 MG tablet Take 20 mg by mouth every morning.      Marland Kitchen DISCONTD: WELCHOL 625 MG tablet Take 1,250 mg by mouth 2 (two) times daily with a meal.          ALLERGIES: Codeine; Antihistamines, diphenhydramine-type; and Erythromycin   LABORATORY DATA:  Lab Results  Component Value Date   WBC 16.2* 03/12/2011   HGB 11.2* 04/17/2011   HCT 33.0* 04/17/2011   MCV 89.1 03/12/2011   PLT 219 03/12/2011   Lab Results  Component Value Date   NA 140 04/17/2011   K 3.8 04/17/2011   CL 107 04/17/2011   CO2 24 03/05/2011   Lab Results  Component Value Date   ALT 25 03/05/2011   AST 19 03/05/2011   ALKPHOS 86 03/05/2011   BILITOT 0.4 03/05/2011     NARRATIVE: [name] was seen today for weekly treatment management. The chart was checked and the patient's films were reviewed. The patient states that she is doing well. She is fatigued. She is continuing to apply radial Plex to the treatment area.  PHYSICAL EXAMINATION: weight is 168 lb 14.4 oz (76.613 kg).      hyperpigmentation is present with a little bit of dry desquamation especially in the axilla and the inframammary region. Overall  her skin looks quite good. No moist desquamation.  ASSESSMENT: The patient is doing satisfactorily with treatment.  PLAN: We will continue with the patient's radiation treatment as planned.

## 2011-05-04 ENCOUNTER — Ambulatory Visit
Admission: RE | Admit: 2011-05-04 | Discharge: 2011-05-04 | Disposition: A | Discharge status: Home / Self Care | Payer: Medicare Other | Source: Ambulatory Visit | Attending: Radiation Oncology | Admitting: Radiation Oncology

## 2011-05-07 ENCOUNTER — Ambulatory Visit
Admission: RE | Admit: 2011-05-07 | Discharge: 2011-05-07 | Disposition: A | Discharge status: Home / Self Care | Payer: Medicare Other | Source: Ambulatory Visit | Attending: Radiation Oncology | Admitting: Radiation Oncology

## 2011-05-08 ENCOUNTER — Ambulatory Visit
Admission: RE | Admit: 2011-05-08 | Discharge: 2011-05-08 | Disposition: A | Discharge status: Home / Self Care | Payer: Medicare Other | Source: Ambulatory Visit | Attending: Radiation Oncology | Admitting: Radiation Oncology

## 2011-05-09 ENCOUNTER — Ambulatory Visit
Admission: RE | Admit: 2011-05-09 | Discharge: 2011-05-09 | Disposition: A | Discharge status: Home / Self Care | Payer: Medicare Other | Source: Ambulatory Visit | Attending: Radiation Oncology | Admitting: Radiation Oncology

## 2011-05-10 ENCOUNTER — Ambulatory Visit
Admission: RE | Admit: 2011-05-10 | Discharge: 2011-05-10 | Disposition: A | Discharge status: Home / Self Care | Payer: Medicare Other | Source: Ambulatory Visit | Attending: Radiation Oncology | Admitting: Radiation Oncology

## 2011-05-10 ENCOUNTER — Encounter: Payer: Self-pay | Admitting: Radiation Oncology

## 2011-05-10 VITALS — Wt 168.0 lb

## 2011-05-10 DIAGNOSIS — C50519 Malignant neoplasm of lower-outer quadrant of unspecified female breast: Secondary | ICD-10-CM

## 2011-05-10 NOTE — Progress Notes (Signed)
Pt has no c/o today, cont w/radiaplex on r breast.

## 2011-05-10 NOTE — Progress Notes (Signed)
   Department of Radiation Oncology  Phone:  [phone] Fax:        [phone]  Weekly Treatment Note    Name: [name] Date: 05/10/2011 MRN: [id number] DOB: 1940/03/24   Current dose: 52.4  Current fraction: 29   MEDICATIONS: Current Outpatient Prescriptions  Medication Sig Dispense Refill  . BD PEN NEEDLE NANO U/F 32G X 4 MM MISC       . carvedilol (COREG) 12.5 MG tablet Take 12.5 mg by mouth 2 (two) times daily with a meal.      . JANUVIA 50 MG tablet       . LEVEMIR FLEXPEN 100 UNIT/ML injection       . levothyroxine (SYNTHROID, LEVOTHROID) 50 MCG tablet Take 50 mcg by mouth daily.      Marland Kitchen lisinopril (PRINIVIL,ZESTRIL) 40 MG tablet Take 40 mg by mouth daily.      Marland Kitchen LIVALO 2 MG TABS       . metFORMIN (GLUCOPHAGE) 500 MG tablet Take 500 mg by mouth daily.      . pantoprazole (PROTONIX) 40 MG tablet Take 40 mg by mouth daily.      Marland Kitchen PARoxetine (PAXIL) 20 MG tablet Take 20 mg by mouth every morning.      Marland Kitchen DISCONTD: WELCHOL 625 MG tablet Take 1,250 mg by mouth 2 (two) times daily with a meal.          ALLERGIES: Codeine; Antihistamines, diphenhydramine-type; and Erythromycin   LABORATORY DATA:  Lab Results  Component Value Date   WBC 16.2* 03/12/2011   HGB 11.2* 04/17/2011   HCT 33.0* 04/17/2011   MCV 89.1 03/12/2011   PLT 219 03/12/2011   Lab Results  Component Value Date   NA 140 04/17/2011   K 3.8 04/17/2011   CL 107 04/17/2011   CO2 24 03/05/2011   Lab Results  Component Value Date   ALT 25 03/05/2011   AST 19 03/05/2011   ALKPHOS 86 03/05/2011   BILITOT 0.4 03/05/2011     NARRATIVE: [name] was seen today for weekly treatment management. The chart was checked and the patient's films were reviewed. The patient is doing very well. She began her boost treatment today. Her setup was checked and this looked excellent. She is using radioplex.  PHYSICAL EXAMINATION: weight is 168 lb (76.204 kg).      diffuse radiation change but overall her skin  looks 3 good. No moist desquamation.  ASSESSMENT: The patient is doing satisfactorily with treatment.  PLAN: We will continue with the patient's radiation treatment as planned.

## 2011-05-11 ENCOUNTER — Ambulatory Visit
Admission: RE | Admit: 2011-05-11 | Discharge: 2011-05-11 | Disposition: A | Discharge status: Home / Self Care | Payer: Medicare Other | Source: Ambulatory Visit | Attending: Radiation Oncology | Admitting: Radiation Oncology

## 2011-05-14 ENCOUNTER — Ambulatory Visit
Admission: RE | Admit: 2011-05-14 | Discharge: 2011-05-14 | Disposition: A | Discharge status: Home / Self Care | Payer: Medicare Other | Source: Ambulatory Visit | Attending: Radiation Oncology | Admitting: Radiation Oncology

## 2011-05-15 ENCOUNTER — Ambulatory Visit
Admission: RE | Admit: 2011-05-15 | Discharge: 2011-05-15 | Disposition: A | Discharge status: Home / Self Care | Payer: Medicare Other | Source: Ambulatory Visit | Attending: Radiation Oncology | Admitting: Radiation Oncology

## 2011-05-16 ENCOUNTER — Ambulatory Visit
Admission: RE | Admit: 2011-05-16 | Discharge: 2011-05-16 | Disposition: A | Discharge status: Home / Self Care | Payer: Medicare Other | Source: Ambulatory Visit | Attending: Radiation Oncology | Admitting: Radiation Oncology

## 2011-05-17 ENCOUNTER — Ambulatory Visit
Admission: RE | Admit: 2011-05-17 | Discharge: 2011-05-17 | Disposition: A | Discharge status: Home / Self Care | Payer: Medicare Other | Source: Ambulatory Visit | Attending: Radiation Oncology | Admitting: Radiation Oncology

## 2011-05-17 ENCOUNTER — Encounter: Payer: Self-pay | Admitting: Radiation Oncology

## 2011-05-17 VITALS — Wt 167.3 lb

## 2011-05-17 DIAGNOSIS — C50519 Malignant neoplasm of lower-outer quadrant of unspecified female breast: Secondary | ICD-10-CM

## 2011-05-17 NOTE — Progress Notes (Signed)
   Department of Radiation Oncology  Phone:  [phone] Fax:        [phone]  Weekly Treatment Note    Name: [name] Date: 05/17/2011 MRN: [id number] DOB: [birth year]   Current dose: 32.4 gray  Current fraction: 34   MEDICATIONS: Current Outpatient Prescriptions  Medication Sig Dispense Refill  . BD PEN NEEDLE NANO U/F 32G X 4 MM MISC       . carvedilol (COREG) 12.5 MG tablet Take 12.5 mg by mouth 2 (two) times daily with a meal.      . JANUVIA 50 MG tablet       . LEVEMIR FLEXPEN 100 UNIT/ML injection       . levothyroxine (SYNTHROID, LEVOTHROID) 50 MCG tablet Take 50 mcg by mouth daily.      Marland Kitchen lisinopril (PRINIVIL,ZESTRIL) 40 MG tablet Take 40 mg by mouth daily.      Marland Kitchen LIVALO 2 MG TABS       . metFORMIN (GLUCOPHAGE) 500 MG tablet Take 500 mg by mouth daily.      . pantoprazole (PROTONIX) 40 MG tablet Take 40 mg by mouth daily.      Marland Kitchen PARoxetine (PAXIL) 20 MG tablet Take 20 mg by mouth every morning.      Marland Kitchen DISCONTD: WELCHOL 625 MG tablet Take 1,250 mg by mouth 2 (two) times daily with a meal.          ALLERGIES: Codeine; Antihistamines, diphenhydramine-type; and Erythromycin   LABORATORY DATA:  Lab Results  Component Value Date   WBC 16.2* 03/12/2011   HGB 11.2* 04/17/2011   HCT 33.0* 04/17/2011   MCV 89.1 03/12/2011   PLT 219 03/12/2011   Lab Results  Component Value Date   NA 140 04/17/2011   K 3.8 04/17/2011   CL 107 04/17/2011   CO2 24 03/05/2011   Lab Results  Component Value Date   ALT 25 03/05/2011   AST 19 03/05/2011   ALKPHOS 86 03/05/2011   BILITOT 0.4 03/05/2011     NARRATIVE: [name] was seen today for weekly treatment management. The chart was checked and the patient's films were reviewed. Patient is doing fairly well. She is continuing to use radioplex. She finishes her treatment tomorrow. She does complain of some fatigue.  PHYSICAL EXAMINATION: weight is 167 lb 4.8 oz (75.887 kg).      diffuse radiation change with some  hyperpigmentation. No moist desquamation  ASSESSMENT: The patient is doing satisfactorily with treatment.  PLAN: We will continue with the patient's radiation treatment as planned. Please with how her skin looks. She will finish her treatment tomorrow and we will have her return to clinic in one month.

## 2011-05-17 NOTE — Progress Notes (Signed)
Pt doing well, applying Radiaplex to right breast. Pt states her "hand is swollen". She attributes it possibly to new med, Metformin. Pt had 1 axillary node removed when she had lumpectomy.  Gave FU card, completes tomorrow.

## 2011-05-18 ENCOUNTER — Ambulatory Visit
Admission: RE | Admit: 2011-05-18 | Discharge: 2011-05-18 | Disposition: A | Discharge status: Home / Self Care | Payer: Medicare Other | Source: Ambulatory Visit | Attending: Radiation Oncology | Admitting: Radiation Oncology

## 2011-05-18 ENCOUNTER — Ambulatory Visit (INDEPENDENT_AMBULATORY_CARE_PROVIDER_SITE_OTHER): Payer: Medicare Other | Admitting: General Surgery

## 2011-05-18 ENCOUNTER — Encounter (INDEPENDENT_AMBULATORY_CARE_PROVIDER_SITE_OTHER): Payer: Self-pay | Admitting: General Surgery

## 2011-05-18 ENCOUNTER — Encounter: Payer: Self-pay | Admitting: *Deleted

## 2011-05-18 VITALS — BP 132/76 | HR 70 | Temp 97.3°F | Resp 18 | Ht 63.0 in | Wt 166.4 lb

## 2011-05-18 DIAGNOSIS — Z09 Encounter for follow-up examination after completed treatment for conditions other than malignant neoplasm: Secondary | ICD-10-CM

## 2011-05-18 NOTE — Progress Notes (Signed)
rec'd  Fax from Dr Lisbeth Renshaw stating that pt is want to sch an appt with Dr Jana Hakim. Attempted to acquire additional info. VM was left for pt to return call. ONC Tx has been sent to schedulers

## 2011-05-18 NOTE — Progress Notes (Addendum)
Simulation and treatment planning note  The patient presented for simulation prior to beginning her course of radiation treatment for her diagnosis of right-sided breast cancer. The patient was placed in a supine position on a breast board. A customized accuform device was also constructed and this complex treatment device will be used on a daily basis during her treatment. In this fashion, a CT scan was obtained through the chest area and an isocenter was placed near the chest wall within the right breast.  The patient will be planned to receive a course of radiation initially to a dose of 50.4 gray. This will consist of a whole breast radiotherapy technique. To accomplish this to customized blocks have been designed which will correspond to medial and lateral whole breast tangent fields. This treatment will be accomplished at 1.8 gray per fraction. A complex isodose plan is requested to ensure that the breast target area is adequately covered dosimetrically. It is anticipated that the patient will then receive a 14 gray boost to the seroma cavity which has been contoured. This will be accomplished at 2 gray per fraction. The final anticipated total dose therefore will correspond to 64.4 gray.

## 2011-05-18 NOTE — Progress Notes (Signed)
Complex simulation note  The patient has undergone complex simulation for her upcoming boost treatment for her diagnosis of breast cancer. The patient has initially been planned to receive 50.4 gray with a course of whole breast radiation. The patient will now receive a 14 gray boost to the seroma cavity which has been contoured. This will be accomplished using an en face electron field. Based on the depth of the target area, 15 MeV electrons will be used and this field has been normalized to the 92% isodose line. The patient's final total dose therefore will be 64.4 gray. A special port plan is requested for the boost treatment.

## 2011-05-18 NOTE — Progress Notes (Signed)
Encounter addended by: Marye Round, MD on: 05/18/2011  9:57 AM<BR>     Documentation filed: Notes Section

## 2011-05-18 NOTE — Progress Notes (Signed)
Subjective:     Patient ID: [name], female   DOB: 1940-12-07, 71 y.o.   MRN: [id number]  HPI This is a 71 year old female who just completed her radiation therapy for breast cancer. She was unable to tolerate chemotherapy recently removed her port. She's done well from that and comes back today without any significant complaints.  Review of Systems     Objective:   Physical Exam Well healed left chest incision without infection    Assessment:     S/p port removal    Plan:      I released her to full activity. I told her she could use vitamin E lotion and massage the scar to help smooth out. She is due to see Dr. Jana Hakim again. I told her I would be happy to see her annually for exam and split the visits with medical oncology or she so desires she can just follow up with medical oncology. She's got a call me back and let me know. We discussed continued self exams and surveillance.

## 2011-05-18 NOTE — Progress Notes (Signed)
Encounter addended by: Marye Round, MD on: 05/18/2011 10:02 AM<BR>     Documentation filed: Notes Section

## 2011-05-20 ENCOUNTER — Other Ambulatory Visit: Payer: Self-pay | Admitting: Oncology

## 2011-05-21 ENCOUNTER — Telehealth: Payer: Self-pay | Admitting: *Deleted

## 2011-05-21 ENCOUNTER — Ambulatory Visit: Payer: Medicare Other

## 2011-05-21 ENCOUNTER — Telehealth: Payer: Self-pay | Admitting: Oncology

## 2011-05-21 NOTE — Telephone Encounter (Signed)
lmonvm advising the pt of her may appt with dr Jana Hakim

## 2011-05-21 NOTE — Telephone Encounter (Signed)
Message left by pt inquiring if okay per MD for her to go to the tanning bed. She would need a note per MD to proceed per their policy.   This RN returned call and obtained identified answering machine. Message left informing pt appt for follow up is in process and her inquiry would need to be addressed to MD at that time.

## 2011-05-28 ENCOUNTER — Ambulatory Visit (HOSPITAL_BASED_OUTPATIENT_CLINIC_OR_DEPARTMENT_OTHER): Payer: Medicare Other | Admitting: Oncology

## 2011-05-28 ENCOUNTER — Telehealth: Payer: Self-pay | Admitting: Oncology

## 2011-05-28 VITALS — BP 149/73 | HR 101 | Temp 97.8°F | Ht 63.0 in | Wt 168.2 lb

## 2011-05-28 DIAGNOSIS — E559 Vitamin D deficiency, unspecified: Secondary | ICD-10-CM

## 2011-05-28 DIAGNOSIS — C50919 Malignant neoplasm of unspecified site of unspecified female breast: Secondary | ICD-10-CM

## 2011-05-28 DIAGNOSIS — Z170 Estrogen receptor positive status [ER+]: Secondary | ICD-10-CM

## 2011-05-28 DIAGNOSIS — C50519 Malignant neoplasm of lower-outer quadrant of unspecified female breast: Secondary | ICD-10-CM

## 2011-05-28 MED ORDER — ANASTROZOLE 1 MG PO TABS: 1.0000 mg | ORAL_TABLET | Freq: Every day | ORAL | Status: AC

## 2011-05-28 NOTE — Telephone Encounter (Signed)
gve the pt her July 2013 appt calendar °

## 2011-05-28 NOTE — Progress Notes (Signed)
ID: [name]   DOB: 1941-01-19  MR#: [id number]  CSN#:[id number]  HISTORY OF PRESENT ILLNESS: The patient had a screening mammogram July of 2009, which was unremarkable. Screening mammography 2012 showed a possible mass in the right breast. Right diagnostic mammogram 12/05/2010 confirmed an irregularly marginated oval mass in the right breast at the 6:00 position measuring up to 1.4 cm. On physical exam Dr. [name] was not able to palpate a discrete abnormality, but ultrasound showed an irregularly marginated hypoechoic mass measuring 1.1 cm, and containing microcalcifications.  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 Estrogen receptors were expressed with 100% positivity, same for progesterone receptors, but there was no HER-2 amplification, with the ratio by C[facility] being 0.96. The MIB-1 was 96%.  With this information the patient underwent bilateral breast MRIs on 12/12/2010, showing a solitary 1.4 cm irregular enhancing mass in the right breast, but no other suspicious masses and no associated adenopathy.  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 Margins were negative. Repeat HER-2 was again negative.  Her subsequent history is as detailed below.  INTERVAL HISTORY: [name] returns after completing her radiation treatments April 26. Overall she did well, and she is now ready to start her antiestrogen therapy   REVIEW OF SYSTEMS: She had some dry desquamation and significant fatigue from her treatments. She is now better, and for example she can do laundry and then not have to lie down for an hour. She has not yet started a walking or exercise program. She is moderately constipated which is unusual because she is on metformin. She has pains in her hands and calves  bilaterally, which improved with sitting or lying. There is no tingling or radiation involved. This doesn't involve the feet or ankles. She also has pains in her fingers, and she does have significant osteoarthritis there, which is not new. She tells me she really does not drink as much fluid as she should and that probably is involved in the symptoms just reported. She has mild urinary leakage, which is not new. Otherwise a detailed review of systems was noncontributory  PAST MEDICAL HISTORY: Past Medical History  Diagnosis Date  . Elevated cholesterol   . GERD (gastroesophageal reflux disease)   . Thyroid disease   . Hypertension   . Coronary artery disease     stent 2007  . Depression   . Diabetes mellitus   . Hypothyroidism   . Cancer     right breast  . 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 Arthritis   . Carcinoma of breast treated with adjuvant chemotherapy    PAST SURGICAL HISTORY: Past Surgical History  Procedure Date  . Cholecystectomy   . Knee surgery 2007  . Carotid stent 2007  . Carpal tunnel release 9/12,8,12    both done  . Dilation and curettage of uterus   . Coronary angioplasty   . Multiple extractions with alveoloplasty   . Foot spurs REMOVED  . Breast lumpectomy 1983    benign biopsy  . Breast surgery 12/29/2010    R lumpectomy, snbx, ER/PR +, Her2 -, 0/1 node pos.  . Portacath placement 02/07/2011    Procedure: INSERTION PORT-A-CATH;  Surgeon: [name], MD;  Location: [facility];  Service: General;  Laterality: N/A;  .  Port-a-cath removal 04/17/2011    Procedure: REMOVAL PORT-A-CATH;  Surgeon: [name], MD;  Location: [facility];  Service: General;  Laterality: Left;    FAMILY HISTORY Family History  Problem Relation Age of Onset  . Cancer Son   . Cancer Father     throat  . Cancer Brother 70    lymphoma  The patient's father died at the age of 25 from cancer "in his temples". The  patient's mother died from renal failure at the age of 76. The patient had 3 brothers, one of whom died at the age of 9 from non-Hodgkin's lymphoma. She has one sister. There is no history of breast or ovarian cancer in the family to her knowledge.    GYNECOLOGIC HISTORY: GX P3. Menarche age 66, last menstrual period in her mid 15s.She used hormone replacement for about 10 years, discontinuing that in 2002.   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 One daughter died from suicide at the age of 42 in the setting of postpartum depression. A second daughter, [name], works in [place] as a Art therapist. Son [name] 36 is a Quarry manager in [place]. The patient has 7 grandchildren. She attends a [employer].    ADVANCED DIRECTIVES: in place  HEALTH MAINTENANCE: History  Substance Use Topics  . Smoking status: Former Smoker -- 2.0 packs/day for 40 years    Quit date: 12/19/1997  . Smokeless tobacco: Former Systems developer    Quit date: 01/22/1997  . Alcohol Use: 3.6 oz/week    6 Glasses of wine per week     occassional    Cholesterol -- under treatment  Bone density Nov 2012 at [facility], "normal"  Colonoscopy 2002  (PAP) Nov 2012    Allergies  Allergen Reactions  . Codeine Nausea And Vomiting and Other (See Comments)    Severe stomach cramps  . Antihistamines, Diphenhydramine-Type Other (See Comments)    Causes hyperactivity  . Erythromycin Hives and Rash    Due to dental work about 50 years ago. Was used in packing and resulted in rash/hives inside and outside of mouth.    Current Outpatient Prescriptions  Medication Sig Dispense Refill  . BD PEN NEEDLE NANO U/F 32G X 4 MM MISC       . carvedilol (COREG) 12.5 MG tablet Take 12.5 mg by mouth 2 (two) times daily with a meal.      . JANUVIA 50 MG tablet       . LEVEMIR FLEXPEN 100 UNIT/ML injection       . levothyroxine (SYNTHROID, LEVOTHROID) 50 MCG tablet  Take 50 mcg by mouth daily.      Marland Kitchen lisinopril (PRINIVIL,ZESTRIL) 40 MG tablet Take 40 mg by mouth daily.      Marland Kitchen LIVALO 2 MG TABS       . metFORMIN (GLUCOPHAGE) 500 MG tablet Take 500 mg by mouth daily.      . pantoprazole (PROTONIX) 40 MG tablet Take 40 mg by mouth daily.      Marland Kitchen PARoxetine (PAXIL) 20 MG tablet Take 20 mg by mouth every morning.      Marland Kitchen anastrozole (ARIMIDEX) 1 MG tablet Take 1 tablet (1 mg total) by mouth daily.  90 tablet  12  . DISCONTD: WELCHOL 625 MG tablet Take 1,250 mg by mouth 2 (two) times daily with a meal.         OBJECTIVE: Middle-aged white woman who appears well  Filed Vitals:   05/28/11 0851  BP: 149/73  Pulse: 101  Temp: 97.8 F (36.6 C)     Body mass index is 29.80 kg/(m^2).    ECOG FS: 1  Sclerae unicteric Oropharynx clear No peripheral adenopathy Lungs no rales or rhonchi Heart regular rate and rhythm Abd soft, nontender, positive bowel sounds, no masses palpated MSK no focal spinal tenderness Neuro: nonfocal; she is alert and oriented x3 Breasts: Right breast is status post lumpectomy and radiation. There is still a little bit of dry desquamation, but very minimal erythema. There is some hyper pigmentation. The cosmetic result is excellent. Left breast is unremarkable  LAB RESULTS: Lab Results  Component Value Date   WBC 16.2* 03/12/2011   NEUTROABS 12.1* 03/12/2011   HGB 11.2* 04/17/2011   HCT 33.0* 04/17/2011   MCV 89.1 03/12/2011   PLT 219 03/12/2011      Chemistry      Component Value Date/Time   NA 140 04/17/2011 0731   K 3.8 04/17/2011 0731   CL 107 04/17/2011 0731   CO2 24 03/05/2011 0949   BUN 13 04/17/2011 0731   CREATININE 0.90 04/17/2011 0731      Component Value Date/Time   CALCIUM 9.7 03/05/2011 0949   ALKPHOS 86 03/05/2011 0949   AST 19 03/05/2011 0949   ALT 25 03/05/2011 0949   BILITOT 0.4 03/05/2011 0949       No results found for this basename: LABCA2    No results found for this basename: INR:1;PROTIME:1 in the last 168  hours  No results found for this basename: UACOL:1,UAPR:1,USPG:1,UPH:1,UTP:1,UGL:1,UKET:1,UBIL:1,UHGB:1,UNIT:1,UROB:1,ULEU:1,UEPI:1,UWBC:1,URBC:1,UBAC:1,CAST:1,CRYS:1,UCOM:1,BILUA:1 in the last 72 hours   STUDIES: Next mammogram due November 2013. We are trying to obtain the [facility]  ASSESSMENT:71 year old [place] woman   (1) status post right lumpectomy and sentinel lymph node biopsy November of 2012 for a pT1c pN0, Stage IA invasive ductal carcinoma, grade 3, strongly estrogen and progesterone receptor positive, HER-2 not amplified, with an MIB-1-1 of 96%. Her Oncotype recurrence score predicted a 14% risk of distant recurrence if her only systemic adjuvant therapy is tamoxifen for five years.   (2) status post two cycles of adjuvant docetaxel/cyclophosphamide, completed February 2013, the final two cycles omitted because of poor tolerance  (3) status post radiation completed April 2013  PLAN: She is now ready to start anti-estrogen therapy. We discussed options and particularly the toxicities side effects and complications of aromatase inhibitors. I think she would be a good candidate for anastrozole, and I wrote her a prescription for 90 tablets today. She should be able to obtain this at minimal cost because she is a member of [employer]. She is not yet walking regularly but I have strongly encouraged her to do that, the goal being 45 minutes 5 times a week. She is taking vitamin D and we will check a vitamin D level before her return visit here. She's not on calcium supplementation and we talked about the need for her to take 1200 mg of calcium daily. If she can do this through her diet that's fine otherwise of course calcium supplementation would be indicated. We will repeat a bone density in 2 years.  She will see [place] again in 3 months. She will let us know if any symptoms develop before then. If she is doing well then, we will start  seeing her on a once a year basis alternating with Dr. Donne Hazel.  Montario Zilka C    05/28/2011

## 2011-06-12 ENCOUNTER — Telehealth: Payer: Self-pay | Admitting: *Deleted

## 2011-06-12 NOTE — Telephone Encounter (Addendum)
Pt. Called.  She has been on arimidex for 20 days and is having problems.  She has bad joint pain in her hands.  She also has numbness, tingling and burning in her hands.   She also is experiencing nausea and anxiety.  Is there something else she can take instead of the arimidex.  She is not due to see Micah Flesher PA until 08/20/11.   Discussed with Dr. Jana Hakim:  Pt. Is to stop drug for one month and then call us and let us know how she is doing in the time she has been off the drug.  Pt. Verbalized that she is to call us about this time in June.   She appreciated the call back.

## 2011-06-26 NOTE — Progress Notes (Signed)
  Radiation Oncology         [phone] ________________________________  Name: [name] MRN: [id number]  Date: 05/17/2011  DOB: 03-21-1940  End of Treatment Note  Diagnosis:   Invasive ductal carcinoma of the right breast     Indication for treatment:  Curative       Radiation treatment dates:   04/02/2011 through 05/17/2011  Site/dose:   The patient was initially treated to the right breast using whole breast radiation. This consisted of medial and lateral tangent fields to a dose of 50.4 gray. The patient then received a 14 gray boost treatment using 15 MeV electrons. The patient's final total dose was 64.4 gray.  Narrative: The patient tolerated radiation treatment relatively well.   She did have some skin irritation primarily consisting of some hyperpigmentation. No significant moist desquamation.   Plan: The patient has completed radiation treatment. The patient will return to radiation oncology clinic for routine followup in one month. I advised the patient to call or return sooner if they have any questions or concerns related to their recovery or treatment. ________________________________  Jodelle Gross, M.D., Ph.D.

## 2011-06-29 ENCOUNTER — Encounter: Payer: Self-pay | Admitting: *Deleted

## 2011-06-29 ENCOUNTER — Other Ambulatory Visit: Payer: Self-pay | Admitting: *Deleted

## 2011-06-29 ENCOUNTER — Encounter: Payer: Self-pay | Admitting: Radiation Oncology

## 2011-06-29 ENCOUNTER — Telehealth: Payer: Self-pay | Admitting: Oncology

## 2011-06-29 ENCOUNTER — Ambulatory Visit
Admission: RE | Admit: 2011-06-29 | Discharge: 2011-06-29 | Disposition: A | Discharge status: Home / Self Care | Payer: Medicare Other | Source: Ambulatory Visit | Attending: Radiation Oncology | Admitting: Radiation Oncology

## 2011-06-29 VITALS — Resp 18 | Wt 165.4 lb

## 2011-06-29 DIAGNOSIS — C50519 Malignant neoplasm of lower-outer quadrant of unspecified female breast: Secondary | ICD-10-CM

## 2011-06-29 NOTE — Progress Notes (Signed)
HERE TODAY FOR FU OF RIGHT BREAST.  SKIN LOOKS GREAT.  NO C/O PAIN IN BREAST BUT DOES HAVE PAIN IN RIGHT HAND WITH BURNING, STINGING AND SOMETIMES FEELS LIKE HOT WATER POURED ON IT AND ALL FINGERS GET NUMB.  NOT TAKING ANTIESTROGEN NOW DUE TO NAUSEA, SHE IS TO STAY OFF OF IT FOR A MONTH AND CALL DR. MAGRINAT.  STILL HAVING THE PAIN IN HER HAND.

## 2011-06-29 NOTE — Progress Notes (Signed)
Pt stopped by post radiation to discuss concerns of ongoing right hand discomfort.  See note per 5/21 with side effects of arimidex and recommendation to hold medication.  Overall symptoms are better except in Right hand. Per pt " it is worsening ".  [name] describes symptoms occurring from a few inches above right wrist down to fingertips.  Symptoms include : Burns  Stings Tingles Numb  Occasionally " like someone is pouring hot water over my hand"  And occassional " weakness to my elbow ".  Pt is able to perform all functions with hand " just with pain ".  No pain above elbow- or in neck/cervical area.  Of note pt has had carpel tunnel surgery on bilateral hands as well as has known arthritis in hands.  Surgery was done under Dr Burney Gauze.  Call number to discuss recommendation post MD review is [phone].

## 2011-06-29 NOTE — Telephone Encounter (Signed)
lmonvm adviisng the pt of her appt with dr Burney Gauze on 07/05/2011@11 :45am

## 2011-06-29 NOTE — Progress Notes (Signed)
Per MD review refer pt back to hand surgeon.  This RN called and discussed with pt.

## 2011-06-29 NOTE — Progress Notes (Signed)
   Radiation Oncology         [phone]) [phone] ________________________________  Name: [name] MRN: [id number]  Date: 06/29/2011  DOB: 1941/01/08  Follow-Up Visit Note  CC: [name], MD, MD  [name], MD  Diagnosis:   Invasive ductal carcinoma of the right breast  Interval Since Last Radiation:  Proximally 5 weeks   Narrative:  The patient returns today for routine follow-up.  She states that she has done well in terms of healing from her skin. She is pleased with this although she notes some continuing darkening. Her primary complaint is some "burning" of her right hand. She has discussed this with Dr. Jana Hakim. She does believe that this has worsened to some degree. She also was experiencing some nausea and she has stopped her antiestrogen to see if this will help with this symptom.                              ALLERGIES:  is allergic to codeine; antihistamines, diphenhydramine-type; and erythromycin.  Meds: Current Outpatient Prescriptions  Medication Sig Dispense Refill  . BD PEN NEEDLE NANO U/F 32G X 4 MM MISC       . carvedilol (COREG) 12.5 MG tablet Take 12.5 mg by mouth 2 (two) times daily with a meal.      . JANUVIA 50 MG tablet       . LEVEMIR FLEXPEN 100 UNIT/ML injection       . levothyroxine (SYNTHROID, LEVOTHROID) 50 MCG tablet Take 50 mcg by mouth daily.      Marland Kitchen lisinopril (PRINIVIL,ZESTRIL) 40 MG tablet Take 40 mg by mouth daily.      Marland Kitchen LIVALO 2 MG TABS       . metFORMIN (GLUCOPHAGE) 500 MG tablet Take 500 mg by mouth daily.      . pantoprazole (PROTONIX) 40 MG tablet Take 40 mg by mouth daily.      Marland Kitchen PARoxetine (PAXIL) 20 MG tablet Take 20 mg by mouth every morning.      Marland Kitchen DISCONTD: WELCHOL 625 MG tablet Take 1,250 mg by mouth 2 (two) times daily with a meal.         Physical Findings: The patient is in no acute distress. Patient is alert and oriented.  weight is 165 lb 6.4 oz (75.025 kg). Her respiration is 18. .   The patient's skin has  healed nicely. Some residual hyperpigmentation, fairly moderate. No ongoing desquamation. I'm pleased with how her skin looks today.  Lab Findings: Lab Results  Component Value Date   WBC 16.2* 03/12/2011   HGB 11.2* 04/17/2011   HCT 33.0* 04/17/2011   MCV 89.1 03/12/2011   PLT 219 03/12/2011     Radiographic Findings: No results found.  Impression:    Pleasant 71 year old female with a right-sided breast cancer status post radiation who is healed very nicely since then.  Plan:  She is to further discuss her symptoms with the right hand with Dr. Jana Hakim. I will have her return to our clinic on a when necessary basis.   Jodelle Gross, M.D., Ph.D.

## 2011-07-05 ENCOUNTER — Telehealth: Payer: Self-pay | Admitting: *Deleted

## 2011-07-05 NOTE — Telephone Encounter (Signed)
PT REPORTS THAT PER MD REQUEST, SHE HAS SEEN A HAND SPECIALIST. THERAPY INCLUDES THE FOLLOW: ULTRA SOUND,  TENS UNIT,  MOIST HEAT,  ICE,  ISOTONER COMPRESSION SLEEVE,  LOMTOPHORESIS,  INTERFENTIAL.[name]  THIS THERAPY IS TO TAKE PLACE ON THE AFFECTED (RIGHT) OF PREVIOUS LUMPECTOMY WILL GIVE THIS NOTE TO MD FOR REVIEW [phone]

## 2011-07-09 ENCOUNTER — Telehealth: Payer: Self-pay | Admitting: *Deleted

## 2011-07-09 NOTE — Telephone Encounter (Signed)
PER MD, OK TO REC. TX FROM "HAND SPECIALIST"

## 2011-07-17 ENCOUNTER — Telehealth: Payer: Self-pay | Admitting: *Deleted

## 2011-07-17 NOTE — Telephone Encounter (Signed)
Call received from pt to give update post holding arimidex since 5/21 stating noted overall improvement and greater function.  Per discussion pt is scheduled for followup late July and plan of care will be discussed at that time.  No further needs at this time.

## 2011-08-20 ENCOUNTER — Encounter: Payer: Self-pay | Admitting: Physician Assistant

## 2011-08-20 ENCOUNTER — Ambulatory Visit (HOSPITAL_BASED_OUTPATIENT_CLINIC_OR_DEPARTMENT_OTHER): Payer: Medicare Other | Admitting: Physician Assistant

## 2011-08-20 ENCOUNTER — Other Ambulatory Visit (HOSPITAL_BASED_OUTPATIENT_CLINIC_OR_DEPARTMENT_OTHER): Payer: Medicare Other | Admitting: Lab

## 2011-08-20 ENCOUNTER — Telehealth: Payer: Self-pay | Admitting: *Deleted

## 2011-08-20 VITALS — BP 132/74 | HR 71 | Temp 98.5°F | Ht 63.0 in | Wt 165.6 lb

## 2011-08-20 DIAGNOSIS — C50519 Malignant neoplasm of lower-outer quadrant of unspecified female breast: Secondary | ICD-10-CM

## 2011-08-20 DIAGNOSIS — E559 Vitamin D deficiency, unspecified: Secondary | ICD-10-CM

## 2011-08-20 DIAGNOSIS — C50919 Malignant neoplasm of unspecified site of unspecified female breast: Secondary | ICD-10-CM

## 2011-08-20 DIAGNOSIS — Z170 Estrogen receptor positive status [ER+]: Secondary | ICD-10-CM

## 2011-08-20 DIAGNOSIS — Z853 Personal history of malignant neoplasm of breast: Secondary | ICD-10-CM

## 2011-08-20 LAB — CBC WITH DIFFERENTIAL/PLATELET
Basophils Absolute: 0 10*3/uL (ref 0.0–0.1)
HCT: 34.8 % (ref 34.8–46.6)
HGB: 11.8 g/dL (ref 11.6–15.9)
LYMPH%: 26 % (ref 14.0–49.7)
MONO#: 0.7 10*3/uL (ref 0.1–0.9)
NEUT%: 54.5 % (ref 38.4–76.8)
Platelets: 200 10*3/uL (ref 145–400)
WBC: 5.7 10*3/uL (ref 3.9–10.3)
lymph#: 1.5 10*3/uL (ref 0.9–3.3)

## 2011-08-20 MED ORDER — LETROZOLE 2.5 MG PO TABS: 2.5000 mg | ORAL_TABLET | Freq: Every day | ORAL | Status: AC

## 2011-08-20 NOTE — Telephone Encounter (Signed)
Gave patient appointment for 10-17-2011 patient to have her mammogram done at [facility]

## 2011-08-20 NOTE — Progress Notes (Signed)
ID: [name]   DOB: Aug 04, 1940  MR#: [id number]  CSN#:[id number]  HISTORY OF PRESENT ILLNESS: The patient had a screening mammogram July of 2009, which was unremarkable. Screening mammography 2012 showed a possible mass in the right breast. Right diagnostic mammogram 12/05/2010 confirmed an irregularly marginated oval mass in the right breast at the 6:00 position measuring up to 1.4 cm. On physical exam Dr. [name] was not able to palpate a discrete abnormality, but ultrasound showed an irregularly marginated hypoechoic mass measuring 1.1 cm, and containing microcalcifications.  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 Estrogen receptors were expressed with 100% positivity, same for progesterone receptors, but there was no HER-2 amplification, with the ratio by CISH being 0.96. The MIB-1 was 96%.  With this information the patient underwent bilateral breast MRIs on 12/12/2010, showing a solitary 1.4 cm irregular enhancing mass in the right breast, but no other suspicious masses and no associated adenopathy.  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 Margins were negative. Repeat HER-2 was again negative.  Her subsequent history is as detailed below.  INTERVAL HISTORY:  [name] returns today for followup of her right breast carcinoma. She began on anastrozole in may of this year, and is here to assess tolerance as well.  Interval history is remarkable for [name] having begun on anastrozole, then discontinued after approximately 3 weeks do to side effects. She had nausea and fatigue. It "caused her arthritis to flare up" and she had increased pain in her hips and her hands. After discontinuing the medication, she felt better very quickly and has had no additional problems.  She has been very busy this summer canning and freezing vegetables from her garden.  Interval history is also remarkable for [name] having undergone right carpal tunnel surgery under the care of Dr.  Burney Gauze last week, July 26. She tolerated the procedure well and is having very little pain.   REVIEW OF SYSTEMS:  [name] feels well with few complaints today. She's had no recent illnesses and denies fevers or chills. No abnormal bleeding. Currently no nausea and no recent change in bowel habits. No cough, shortness of breath, or chest pain. No abnormal headaches or dizziness. No unusual myalgias or arthralgias.  A detailed review of systems is otherwise noncontributory.   PAST MEDICAL HISTORY: Past Medical History  Diagnosis Date  . Elevated cholesterol   . GERD (gastroesophageal reflux disease)   . Thyroid disease   . Hypertension   . Coronary artery disease     stent 2007  . Depression   . Diabetes mellitus   . Hypothyroidism   . Cancer     right breast  . 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 Arthritis   . Carcinoma of breast treated with adjuvant chemotherapy    PAST SURGICAL HISTORY: Past Surgical History  Procedure Date  . Cholecystectomy   . Knee surgery 2007  . Carotid stent 2007  . Carpal tunnel release 9/12,8,12    both done  . Dilation and curettage of uterus   . Coronary angioplasty   . Foot spurs REMOVED  . Breast lumpectomy 1983    benign biopsy  . Breast surgery 12/29/2010    R lumpectomy, snbx, ER/PR +, Her2 -, 0/1 node pos.  Sol Passer  placement 02/07/2011    Procedure: INSERTION PORT-A-CATH;  Surgeon: [name], MD;  Location: [facility];  Service: General;  Laterality: N/A;  . Port-a-cath removal 04/17/2011    Procedure: REMOVAL PORT-A-CATH;  Surgeon: [name], MD;  Location: [facility];  Service: General;  Laterality: Left;    FAMILY HISTORY Family History  Problem Relation Age of Onset   . Cancer Son   . Cancer Father     throat  . Cancer Brother 66    lymphoma  The patient's father died at the age of 69 from cancer "in his temples". The patient's mother died from renal failure at the age of 56. The patient had 3 brothers, one of whom died at the age of 88 from non-Hodgkin's lymphoma. She has one sister. There is no history of breast or ovarian cancer in the family to her knowledge.    GYNECOLOGIC HISTORY: GX P3. Menarche age 30, last menstrual period in her mid 35s.She used hormone replacement for about 10 years, discontinuing that in 2002.   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 One daughter died from suicide at the age of 49 in the setting of postpartum depression. A second daughter, [name], works in [place] as a Art therapist. Son [name] 36 is a Quarry manager in [place]. The patient has 7 grandchildren. She attends a [employer].    ADVANCED DIRECTIVES: in place  HEALTH MAINTENANCE: History  Substance Use Topics  . Smoking status: Former Smoker -- 2.0 packs/day for 40 years    Quit date: 12/19/1997  . Smokeless tobacco: Former Systems developer    Quit date: 01/22/1997  . Alcohol Use: 3.6 oz/week    6 Glasses of wine per week     occassional    Cholesterol -- under treatment  Bone density Nov 2012 at [facility], "normal"  Colonoscopy 2002  (PAP) Nov 2012    Allergies  Allergen Reactions  . Codeine Nausea And Vomiting and Other (See Comments)    Severe stomach cramps  . Antihistamines, Diphenhydramine-Type Other (See Comments)    Causes hyperactivity  . Erythromycin Hives and Rash    Due to dental work about 50 years ago. Was used in packing and resulted in rash/hives inside and outside of mouth.    Current Outpatient Prescriptions  Medication Sig Dispense Refill  . BD PEN NEEDLE NANO U/F 32G X 4 MM MISC       . carvedilol (COREG) 12.5 MG tablet Take 12.5 mg by mouth 2 (two) times  daily with a meal.      . colesevelam (WELCHOL) 625 MG tablet Take 1,875 mg by mouth 2 (two) times daily with a meal.      . JANUVIA 50 MG tablet       . LEVEMIR FLEXPEN 100 UNIT/ML injection       . levothyroxine (SYNTHROID, LEVOTHROID) 50 MCG tablet Take 50 mcg by mouth daily.      Marland Kitchen lisinopril (PRINIVIL,ZESTRIL) 40 MG tablet Take 40 mg by mouth daily.      Marland Kitchen LIVALO 2 MG TABS       . pantoprazole (PROTONIX) 40 MG tablet Take 40 mg by mouth daily.      Marland Kitchen PARoxetine (PAXIL) 20 MG tablet Take 20 mg by mouth every morning.      Marland Kitchen letrozole (FEMARA) 2.5 MG tablet Take 1 tablet (2.5 mg total) by mouth daily.  30 tablet  5  OBJECTIVE: Middle-aged white woman who appears well and in no acute distress. Filed Vitals:   08/20/11 1110  BP: 132/74  Pulse: 71  Temp: 98.5 F (36.9 C)     Body mass index is 29.33 kg/(m^2).    ECOG FS: 1  Filed Weights   08/20/11 1110  Weight: 165 lb 9.6 oz (75.116 kg)   Physical Exam: HEENT:  Sclerae anicteric.  Oropharynx clear.   Nodes:  No cervical, supraclavicular, or axillary lymphadenopathy palpated.  Breast Exam:  Right breast is status post lumpectomy with well-healed incision. Slight tenderness to palpation. No suspicious nodules or skin changes, and no evidence of local recurrence. Left breast is benign no masses, skin changes, or nipple inversion. Lungs:  Clear to auscultation bilaterally.  No crackles, rhonchi, or wheezes.   Heart:  Regular rate and rhythm.   Abdomen:  Soft, nontender.  Positive bowel sounds.  No organomegaly or masses palpated.   Musculoskeletal:  No focal spinal tenderness to palpation.  Extremities:  There is a clean dry dressing comfort by an Ace bandage on the right upper extremity, status post recent carpal tunnel surgery. Mild edema is noted in the lower portion of the right arm.  Otherwise no peripheral edema or cyanosis.   Skin:  Benign.   Neuro:  Nonfocal. Alert and oriented x3.    LAB RESULTS: Lab Results    Component Value Date   WBC 5.7 08/20/2011   NEUTROABS 3.1 08/20/2011   HGB 11.8 08/20/2011   HCT 34.8 08/20/2011   MCV 89.8 08/20/2011   PLT 200 08/20/2011      Chemistry      Component Value Date/Time   NA 140 04/17/2011 0731   K 3.8 04/17/2011 0731   CL 107 04/17/2011 0731   CO2 24 03/05/2011 0949   BUN 13 04/17/2011 0731   CREATININE 0.90 04/17/2011 0731      Component Value Date/Time   CALCIUM 9.7 03/05/2011 0949   ALKPHOS 86 03/05/2011 0949   AST 19 03/05/2011 0949   ALT 25 03/05/2011 0949   BILITOT 0.4 03/05/2011 0949     CMET and Vitamin D level are pending.   STUDIES: Next mammogram due November 2013. We are trying to obtain the [facility]  ASSESSMENT:71 year old [place] woman   (1) status post right lumpectomy and sentinel lymph node biopsy November of 2012 for a pT1c pN0, Stage IA invasive ductal carcinoma, grade 3, strongly estrogen and progesterone receptor positive, HER-2 not amplified, with an MIB-1-1 of 96%. Her Oncotype recurrence score predicted a 14% risk of distant recurrence if her only systemic adjuvant therapy is tamoxifen for five years.   (2) status post two cycles of adjuvant docetaxel/cyclophosphamide, completed February 2013, the final two cycles omitted because of poor tolerance  (3) status post radiation completed April 2013  (4)  on anastrozole briefly in May 2013, discontinued secondary to side effects  PLAN:  This case was reviewed with Dr. Jana Hakim. We are going to try Stefhanie on letrozole, 2.5 mg daily. I will plan on seeing her back in 6-8 weeks to assess tolerance to that medication.  She is also being scheduled for her annual mammogram which is due in November of this year.  Jamyla knows to call with any changes or problems prior to her next scheduled appointment. If she is doing well when I see her in September, we will likely start seeing her on an annual basis, alternating visits with her  surgeon, Dr. Donne Hazel.  Kanyon Bunn    08/20/2011

## 2011-08-21 LAB — COMPREHENSIVE METABOLIC PANEL
BUN: 20 mg/dL (ref 6–23)
CO2: 24 mEq/L (ref 19–32)
Calcium: 9.2 mg/dL (ref 8.4–10.5)
Chloride: 106 mEq/L (ref 96–112)
Creatinine, Ser: 1.07 mg/dL (ref 0.50–1.10)
Glucose, Bld: 88 mg/dL (ref 70–99)
Total Bilirubin: 0.4 mg/dL (ref 0.3–1.2)

## 2011-08-21 LAB — VITAMIN D 25 HYDROXY (VIT D DEFICIENCY, FRACTURES): Vit D, 25-Hydroxy: 53 ng/mL (ref 30–89)

## 2011-10-17 ENCOUNTER — Other Ambulatory Visit (HOSPITAL_BASED_OUTPATIENT_CLINIC_OR_DEPARTMENT_OTHER): Payer: Medicare Other | Admitting: Lab

## 2011-10-17 ENCOUNTER — Telehealth: Payer: Self-pay | Admitting: *Deleted

## 2011-10-17 ENCOUNTER — Encounter: Payer: Self-pay | Admitting: Physician Assistant

## 2011-10-17 ENCOUNTER — Ambulatory Visit (HOSPITAL_BASED_OUTPATIENT_CLINIC_OR_DEPARTMENT_OTHER): Payer: Medicare Other | Admitting: Physician Assistant

## 2011-10-17 VITALS — BP 108/63 | HR 82 | Temp 98.1°F | Resp 20 | Ht 63.0 in | Wt 160.4 lb

## 2011-10-17 DIAGNOSIS — F419 Anxiety disorder, unspecified: Secondary | ICD-10-CM

## 2011-10-17 DIAGNOSIS — G4700 Insomnia, unspecified: Secondary | ICD-10-CM

## 2011-10-17 DIAGNOSIS — C50519 Malignant neoplasm of lower-outer quadrant of unspecified female breast: Secondary | ICD-10-CM

## 2011-10-17 DIAGNOSIS — Z170 Estrogen receptor positive status [ER+]: Secondary | ICD-10-CM

## 2011-10-17 DIAGNOSIS — F411 Generalized anxiety disorder: Secondary | ICD-10-CM

## 2011-10-17 LAB — CBC WITH DIFFERENTIAL/PLATELET
Basophils Absolute: 0.1 10*3/uL (ref 0.0–0.1)
EOS%: 5.7 % (ref 0.0–7.0)
Eosinophils Absolute: 0.3 10*3/uL (ref 0.0–0.5)
HGB: 12.3 g/dL (ref 11.6–15.9)
MCH: 31.4 pg (ref 25.1–34.0)
MONO#: 0.6 10*3/uL (ref 0.1–0.9)
NEUT#: 2.3 10*3/uL (ref 1.5–6.5)
RDW: 14.1 % (ref 11.2–14.5)
WBC: 4.8 10*3/uL (ref 3.9–10.3)
lymph#: 1.5 10*3/uL (ref 0.9–3.3)

## 2011-10-17 MED ORDER — EXEMESTANE 25 MG PO TABS: 25.0000 mg | ORAL_TABLET | Freq: Every day | ORAL | Status: DC

## 2011-10-17 MED ORDER — LORAZEPAM 0.5 MG PO TABS: 0.5000 mg | ORAL_TABLET | Freq: Every evening | ORAL | Status: DC | PRN

## 2011-10-17 NOTE — Telephone Encounter (Signed)
GAVE PATIENT APPOINTMENT FOR 12-12-2011 AT 9:15AM

## 2011-10-17 NOTE — Patient Instructions (Signed)
Try exemestane (Aromasin) and return in 6-8 weeks for follow up.  Lorazepam at night for anxiety and insomnia.  Try massage therapy.  Increase exercise - try to walk 30-40 minutes daily.

## 2011-10-17 NOTE — Progress Notes (Signed)
ID: [name]   DOB: [birth year]  MR#: [id number]  CSN#:[id number]  HISTORY OF PRESENT ILLNESS: The patient had a screening mammogram July of 2009, which was unremarkable. Screening mammography 2012 showed a possible mass in the right breast. Right diagnostic mammogram 12/05/2010 confirmed an irregularly marginated oval mass in the right breast at the 6:00 position measuring up to 1.4 cm. On physical exam Dr. [name] was not able to palpate a discrete abnormality, but ultrasound showed an irregularly marginated hypoechoic mass measuring 1.1 cm, and containing microcalcifications.  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 Estrogen receptors were expressed with 100% positivity, same for progesterone receptors, but there was no HER-2 amplification, with the ratio by CISH being 0.96. The MIB-1 was 96%.  With this information the patient underwent bilateral breast MRIs on 12/12/2010, showing a solitary 1.4 cm irregular enhancing mass in the right breast, but no other suspicious masses and no associated adenopathy.  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 Margins were negative. Repeat HER-2 was again negative.  Her subsequent history is as detailed below.  INTERVAL HISTORY:  [name] returns today for followup (age 71) of her right breast carcinoma. As a brief recap, she was started on anastrozole in may, but discontinued the drug due to side effects. She been started on letrozole in early August, again discontinuing the medication due to intolerance. She developed severe headaches, as well as nausea while taking the letrozole. She took the pills for 10-15 days, then discontinued them, and noted an immediate improvement in her symptoms.  She does continue to have joint pain which she has  found does not seem to be attributed to the aromatase inhibitors. She's also experiencing more anxiety these days, and is having some problems sleeping.  REVIEW OF SYSTEMS:  [name] denies any recent illnesses and has had no fevers, chills, or excessive hot flashes. She's eating and drinking well denies any current nausea. She tends to be slightly constipated at times, but currently that is not a problem and she is having regular bowel movements. She denies any cough, shortness of breath, or chest pain. No abnormal headaches, dizziness, or change in vision. No peripheral swelling.  A detailed review of systems is otherwise stable and noncontributory.  PAST MEDICAL HISTORY: Past Medical History  Diagnosis Date  . Elevated cholesterol   . GERD (gastroesophageal reflux disease)   . Thyroid disease   . Hypertension   . Coronary artery disease     stent 2007  . Depression   . Diabetes mellitus   . Hypothyroidism   . Cancer     right breast  . 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 Arthritis   . Carcinoma of breast treated with adjuvant chemotherapy    PAST SURGICAL HISTORY: Past Surgical History  Procedure Date  . Cholecystectomy   . Knee surgery 2007  . Carotid stent 2007  . Carpal tunnel release 9/12,8,12    both done  . Dilation and curettage of uterus   . Coronary angioplasty   . Foot spurs REMOVED  . Breast lumpectomy 1983    benign biopsy  . Breast surgery 12/29/2010    R lumpectomy, snbx, ER/PR +, Her2 -, 0/1 node pos.  . Portacath placement 02/07/2011    Procedure: INSERTION PORT-A-CATH;  Surgeon: [name], MD;  Location: [facility];  Service: General;  Laterality: N/A;  . Port-a-cath removal 04/17/2011    Procedure: REMOVAL PORT-A-CATH;  Surgeon: [name], MD;  Location: [facility];  Service: General;  Laterality: Left;    FAMILY HISTORY Family History  Problem Relation Age of Onset  . Cancer Son   .  Cancer Father     throat  . Cancer Brother 51    lymphoma  The patient's father died at the age of 43 from cancer "in his temples". The patient's mother died from renal failure at the age of 61. The patient had 3 brothers, one of whom died at the age of 60 from non-Hodgkin's lymphoma. She has one sister. There is no history of breast or ovarian cancer in the family to her knowledge.    GYNECOLOGIC HISTORY: GX P3. Menarche age 71, last menstrual period in her mid 69s.She used hormone replacement for about 10 years, discontinuing that in 2002.   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 One daughter died from suicide at the age of 66 in the setting of postpartum depression. A second daughter, [name], works in [place] as a Art therapist. Son Leneisha Kulaga 36 is a Quarry manager in [place]. The patient has 7 grandchildren. She attends a [employer].    ADVANCED DIRECTIVES: in place  HEALTH MAINTENANCE: History  Substance Use Topics  . Smoking status: Former Smoker -- 2.0 packs/day for 40 years    Quit date: 12/19/1997  . Smokeless tobacco: Former Systems developer    Quit date: 01/22/1997  . Alcohol Use: 3.6 oz/week    6 Glasses of wine per week     occassional    Cholesterol -- under treatment  Bone density Nov 2012 at [facility], "normal"  Colonoscopy 2002  (PAP) Nov 2012    Allergies  Allergen Reactions  . Codeine Nausea And Vomiting and Other (See Comments)    Severe stomach cramps  . Antihistamines, Diphenhydramine-Type Other (See Comments)    Causes hyperactivity  . Erythromycin Hives and Rash    Due to dental work about 50 years ago. Was used in packing and resulted in rash/hives inside and outside of mouth.    Current Outpatient Prescriptions  Medication Sig Dispense Refill  . BD PEN NEEDLE NANO U/F 32G X 4 MM MISC       . carvedilol (COREG) 12.5 MG tablet Take 12.5 mg by mouth 2 (two) times daily with a meal.       . cholecalciferol (VITAMIN D) 1000 UNITS tablet Take 1,000 Units by mouth daily.      . colesevelam (WELCHOL) 625 MG tablet Take 1,875 mg by mouth 2 (two) times daily with a meal.      . fish oil-omega-3 fatty acids 1000 MG capsule Take 1,200 mg by mouth 2 (two) times daily.      Marland Kitchen JANUVIA 50 MG tablet       . LEVEMIR FLEXPEN 100 UNIT/ML injection       . levothyroxine (SYNTHROID, LEVOTHROID) 50 MCG tablet Take 50 mcg by mouth daily.      Marland Kitchen lisinopril (PRINIVIL,ZESTRIL) 40 MG tablet Take 40 mg by mouth daily.      Marland Kitchen LIVALO 2 MG TABS       . pantoprazole (PROTONIX) 40 MG tablet Take 40 mg by mouth daily.      Marland Kitchen PARoxetine (PAXIL) 20 MG tablet Take 20 mg by  mouth every morning.      Marland Kitchen exemestane (AROMASIN) 25 MG tablet Take 1 tablet (25 mg total) by mouth daily.  30 tablet  6  . LORazepam (ATIVAN) 0.5 MG tablet Take 1 tablet (0.5 mg total) by mouth at bedtime as needed for anxiety.  30 tablet  0    OBJECTIVE: Middle-aged white woman who appears well and in no acute distress. Filed Vitals:   10/17/11 1035  BP: 108/63  Pulse: 82  Temp: 98.1 F (36.7 C)  Resp: 20     Body mass index is 28.41 kg/(m^2).    ECOG FS: 1  Filed Weights   10/17/11 1035  Weight: 160 lb 6.4 oz (72.757 kg)   Physical Exam: HEENT:  Sclerae anicteric.  Oropharynx clear.   Nodes:  No cervical, supraclavicular, or axillary lymphadenopathy palpated.  Breast Exam: Deferred Lungs:  Clear to auscultation bilaterally.  No crackles, rhonchi, or wheezes.   Heart:  Regular rate and rhythm.   Abdomen:  Soft, nontender.  Positive bowel sounds.  Musculoskeletal:  No focal spinal tenderness to palpation.  Extremities: No peripheral edema Neuro:  Nonfocal. Alert and oriented x3.    LAB RESULTS: Lab Results  Component Value Date   WBC 4.8 10/17/2011   NEUTROABS 2.3 10/17/2011   HGB 12.3 10/17/2011   HCT 35.9 10/17/2011   MCV 91.6 10/17/2011   PLT 202 10/17/2011      Chemistry      Component Value Date/Time   NA  140 08/20/2011 1052   K 4.5 08/20/2011 1052   CL 106 08/20/2011 1052   CO2 24 08/20/2011 1052   BUN 20 08/20/2011 1052   CREATININE 1.07 08/20/2011 1052      Component Value Date/Time   CALCIUM 9.2 08/20/2011 1052   ALKPHOS 62 08/20/2011 1052   AST 16 08/20/2011 1052   ALT 15 08/20/2011 1052   BILITOT 0.4 08/20/2011 1052        STUDIES: Next mammogram due November 2013. We are trying to obtain the bone density [facility]  ASSESSMENT:71 year old [place] woman   (1) status post right lumpectomy and sentinel lymph node biopsy November of 2012 for a pT1c pN0, Stage IA invasive ductal carcinoma, grade 3, strongly estrogen and progesterone receptor positive, HER-2 not amplified, with an MIB-1-1 of 96%. Her Oncotype recurrence score predicted a 14% risk of distant recurrence if her only systemic adjuvant therapy is tamoxifen for five years.   (2) status post two cycles of adjuvant docetaxel/cyclophosphamide, completed February 2013, the final two cycles omitted because of poor tolerance  (3) status post radiation completed April 2013  (4)  on anastrozole briefly in May 2013, discontinued secondary to side effects  (5)  on letrozole briefly in early August 2013, discontinued secondary to side effects which included nausea and headaches  PLAN:  Ilham has agreed to try one more aromatase inhibitor, specifically exemestane, and was given a prescription for this today, 25 mg daily. I will see her back in 6-8 weeks to assess tolerance to the new medication.  We also discussed ways to decrease her anxiety, stress, and improved insomnia. I encouraged her to walk daily, ideally for 30-40 minutes daily. We discussed massage therapy for muscle relaxation. I have given her prescription for lorazepam to use sparingly, 0.5 mg at night as needed for anxiety and/or insomnia. We will reassess when she returns.  If she is doing well when I see her back in November, we  will  likely see her on an annual basis, alternating visits with her surgeon, Dr. Donne Hazel.  All this information was given to [place] in writing today. She voiced understanding and agreement, and knows to call with any changes or problems. This case was reviewed with Dr. Jana Hakim. We are going to try Markelle on letrozole, 2.5 mg daily. I will plan on seeing her back in 6-8 weeks to assess tolerance to that medication.    Ahmiyah Coil    10/17/2011

## 2011-12-07 ENCOUNTER — Ambulatory Visit
Admission: RE | Admit: 2011-12-07 | Discharge: 2011-12-07 | Disposition: A | Discharge status: Home / Self Care | Payer: Medicare Other | Source: Ambulatory Visit | Attending: Physician Assistant | Admitting: Physician Assistant

## 2011-12-07 DIAGNOSIS — Z853 Personal history of malignant neoplasm of breast: Secondary | ICD-10-CM

## 2011-12-12 ENCOUNTER — Telehealth: Payer: Self-pay | Admitting: *Deleted

## 2011-12-12 ENCOUNTER — Ambulatory Visit (HOSPITAL_BASED_OUTPATIENT_CLINIC_OR_DEPARTMENT_OTHER): Payer: Medicare Other | Admitting: Physician Assistant

## 2011-12-12 ENCOUNTER — Encounter: Payer: Self-pay | Admitting: Physician Assistant

## 2011-12-12 VITALS — BP 136/77 | HR 86 | Temp 98.2°F | Resp 20 | Ht 63.0 in | Wt 163.3 lb

## 2011-12-12 DIAGNOSIS — C50919 Malignant neoplasm of unspecified site of unspecified female breast: Secondary | ICD-10-CM

## 2011-12-12 DIAGNOSIS — Z170 Estrogen receptor positive status [ER+]: Secondary | ICD-10-CM

## 2011-12-12 DIAGNOSIS — M2550 Pain in unspecified joint: Secondary | ICD-10-CM

## 2011-12-12 DIAGNOSIS — C50519 Malignant neoplasm of lower-outer quadrant of unspecified female breast: Secondary | ICD-10-CM

## 2011-12-12 MED ORDER — TAMOXIFEN CITRATE 20 MG PO TABS: 20.0000 mg | ORAL_TABLET | Freq: Every day | ORAL | Status: DC

## 2011-12-12 NOTE — Progress Notes (Signed)
ID: [name]   DOB: 1940-05-28  MR#: [id number]  CSN#:[id number]  HISTORY OF PRESENT ILLNESS: The patient had a screening mammogram July of 2009, which was unremarkable. Screening mammography 2012 showed a possible mass in the right breast. Right diagnostic mammogram 12/05/2010 confirmed an irregularly marginated oval mass in the right breast at the 6:00 position measuring up to 1.4 cm. On physical exam Dr. [name] was not able to palpate a discrete abnormality, but ultrasound showed an irregularly marginated hypoechoic mass measuring 1.1 cm, and containing microcalcifications.  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 Estrogen receptors were expressed with 100% positivity, same for progesterone receptors, but there was no HER-2 amplification, with the ratio by CISH being 0.96. The MIB-1 was 96%.  With this information the patient underwent bilateral breast MRIs on 12/12/2010, showing a solitary 1.4 cm irregular enhancing mass in the right breast, but no other suspicious masses and no associated adenopathy.  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 Margins were negative. Repeat HER-2 was again negative.  Her subsequent history is as detailed below.  INTERVAL HISTORY:  Lynden returns today for followup of her right breast carcinoma. As a recap, she was started on anastrozole in May 2013, but discontinued the drug due to side effects. She  started on letrozole in early August, again discontinuing the medication due to intolerance. She developed severe headaches, as well as nausea while taking the letrozole. She took the pills for 10-15 days, then discontinued them, and noted an immediate improvement in her symptoms.  She began on exemestane in September, and although she is  still taking the medication, she is tolerating it  very poorly with severe arthralgias "all over".   REVIEW OF SYSTEMS:  [name] denies any recent illnesses and has had no fevers, chills, or excessive hot flashes. She's eating and drinking well denies any current nausea or change in bowel or bladder habits. She denies any cough, shortness of breath, or chest pain. She has recently noticed some pain in the anterior right rib cage. This was evaluated with a chest x-ray by her primary care physician last week, Dr. Bernerd Limbo, and per patient report "it did not show anything in the ribs". No abnormal headaches, dizziness, or change in vision. No peripheral swelling.   A detailed review of systems is otherwise stable and noncontributory.  PAST MEDICAL HISTORY: Past Medical History  Diagnosis Date  . Elevated cholesterol   . GERD (gastroesophageal reflux disease)   . Thyroid disease   . Hypertension   . Coronary artery disease     stent 2007  . Depression   . Diabetes mellitus   . Hypothyroidism   . Cancer     right breast  . 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 Arthritis   . Carcinoma of breast treated with adjuvant chemotherapy    PAST SURGICAL HISTORY: Past Surgical History  Procedure Date  . Cholecystectomy   . Knee surgery 2007  . Carotid stent 2007  . Carpal tunnel release 9/12,8,12    both done  . Dilation and curettage of uterus   . Coronary angioplasty   . Foot spurs REMOVED  . Breast lumpectomy 1983    benign biopsy  . Breast surgery 12/29/2010  R lumpectomy, snbx, ER/PR +, Her2 -, 0/1 node pos.  . Portacath placement 02/07/2011    Procedure: INSERTION PORT-A-CATH;  Surgeon: [name], MD;  Location: [facility];  Service: General;  Laterality: N/A;  . Port-a-cath removal 04/17/2011    Procedure: REMOVAL PORT-A-CATH;  Surgeon: [name], MD;  Location: [facility];  Service: General;  Laterality: Left;     FAMILY HISTORY Family History  Problem Relation Age of Onset  . Cancer Son   . Cancer Father     throat  . Cancer Brother 43    lymphoma  The patient's father died at the age of 64 from cancer "in his temples". The patient's mother died from renal failure at the age of 35. The patient had 3 brothers, one of whom died at the age of 40 from non-Hodgkin's lymphoma. She has one sister. There is no history of breast or ovarian cancer in the family to her knowledge.    GYNECOLOGIC HISTORY: GX P3. Menarche age 32, last menstrual period in her mid 76s.She used hormone replacement for about 10 years, discontinuing that in 2002.   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 One daughter died from suicide at the age of 53 in the setting of postpartum depression. A second daughter, [name], works in [place] as a Art therapist. Son [name] 36 is a Quarry manager in [place]. The patient has 7 grandchildren. She attends a [employer].    ADVANCED DIRECTIVES: in place  HEALTH MAINTENANCE: History  Substance Use Topics  . Smoking status: Former Smoker -- 2.0 packs/day for 40 years    Quit date: 12/19/1997  . Smokeless tobacco: Former Systems developer    Quit date: 01/22/1997  . Alcohol Use: 3.6 oz/week    6 Glasses of wine per week     Comment: occassional    Cholesterol -- under treatment  Bone density Nov 2012 at [facility], "normal"  Colonoscopy 2002  (PAP) Nov 2012    Allergies  Allergen Reactions  . Codeine Nausea And Vomiting and Other (See Comments)    Severe stomach cramps  . Antihistamines, Diphenhydramine-Type Other (See Comments)    Causes hyperactivity  . Erythromycin Hives and Rash    Due to dental work about 50 years ago. Was used in packing and resulted in rash/hives inside and outside of mouth.    Current Outpatient Prescriptions  Medication Sig Dispense Refill  . aspirin 81 MG tablet Take 81 mg by  mouth daily.      . BD PEN NEEDLE NANO U/F 32G X 4 MM MISC       . carvedilol (COREG) 12.5 MG tablet Take 12.5 mg by mouth 2 (two) times daily with a meal.      . cholecalciferol (VITAMIN D) 1000 UNITS tablet Take 1,000 Units by mouth daily.      . colesevelam (WELCHOL) 625 MG tablet Take 1,875 mg by mouth 2 (two) times daily with a meal.      . fish oil-omega-3 fatty acids 1000 MG capsule Take 1,200 mg by mouth 2 (two) times daily.      Marland Kitchen JANUVIA 50 MG tablet       . LEVEMIR FLEXPEN 100 UNIT/ML injection       . levothyroxine (SYNTHROID, LEVOTHROID) 50 MCG tablet Take 50 mcg by mouth daily.      Marland Kitchen lisinopril (PRINIVIL,ZESTRIL) 40 MG tablet Take 40 mg by mouth daily.      Marland Kitchen  LIVALO 2 MG TABS       . pantoprazole (PROTONIX) 40 MG tablet Take 40 mg by mouth daily.      Marland Kitchen PARoxetine (PAXIL) 20 MG tablet Take 20 mg by mouth every morning.      Marland Kitchen LORazepam (ATIVAN) 0.5 MG tablet Take 1 tablet (0.5 mg total) by mouth at bedtime as needed for anxiety.  30 tablet  0  . tamoxifen (NOLVADEX) 20 MG tablet Take 1 tablet (20 mg total) by mouth daily.  30 tablet  6    OBJECTIVE: Middle-aged white woman who appears well and in no acute distress. Filed Vitals:   12/12/11 0927  BP: 136/77  Pulse: 86  Temp: 98.2 F (36.8 C)  Resp: 20     Body mass index is 28.93 kg/(m^2).    ECOG FS: 1  Filed Weights   12/12/11 0927  Weight: 163 lb 4.8 oz (74.072 kg)   Remainder of physical exam was deferred today.   LAB RESULTS: Lab Results  Component Value Date   WBC 4.8 10/17/2011   NEUTROABS 2.3 10/17/2011   HGB 12.3 10/17/2011   HCT 35.9 10/17/2011   MCV 91.6 10/17/2011   PLT 202 10/17/2011      Chemistry      Component Value Date/Time   NA 140 08/20/2011 1052   K 4.5 08/20/2011 1052   CL 106 08/20/2011 1052   CO2 24 08/20/2011 1052   BUN 20 08/20/2011 1052   CREATININE 1.07 08/20/2011 1052      Component Value Date/Time   CALCIUM 9.2 08/20/2011 1052   ALKPHOS 62 08/20/2011 1052   AST 16 08/20/2011 1052    ALT 15 08/20/2011 1052   BILITOT 0.4 08/20/2011 1052        STUDIES: Mammogram at [facility] on 12/07/11 was unremarkable.   ASSESSMENT:71 year old [place] woman   (1) status post right lumpectomy and sentinel lymph node biopsy November of 2012 for a pT1c pN0, Stage IA invasive ductal carcinoma, grade 3, strongly estrogen and progesterone receptor positive, HER-2 not amplified, with an MIB-1-1 of 96%. Her Oncotype recurrence score predicted a 14% risk of distant recurrence if her only systemic adjuvant therapy is tamoxifen for five years.   (2) status post two cycles of adjuvant docetaxel/cyclophosphamide, completed February 2013, the final two cycles omitted because of poor tolerance  (3) status post radiation completed April 2013  (4)  on anastrozole briefly in May 2013, discontinued secondary to side effects  (5)  on letrozole briefly in early August 2013, discontinued secondary to side effects which included nausea and headaches  (6)  On exemestane between September and November 2013, discontinued doe to arthralgias  (7)  Being started on tamoxifen beginning in December 2013  PLAN:  This case was reviewed with Dr. Jana Hakim, and over 30 minutes was spent counseling the patient today regarding her options, and the possibility of switching to tamoxifen rather than an aromatase inhibitor. Accordingly, Darrelle will discontinue exemestane and will have a 2-3 week "wash out period". She will then started on tamoxifen, 20 mg daily.  We discussed not only possible benefits, but also the possible side effects and toxicities associated with tamoxifen. Patient voiced her understanding of these.  Guinevere is already scheduled to see her GYN later today for routine pelvic and Pap. She has eye exams annually. She will take a baby aspirin daily.   She'll return to see [place] for brief followup to assess tolerance in early March. If at that point she  is doing well, we will likely see her on an  annual basis, alternating visits with her surgeon, Dr. Donne Hazel.  All this information was given to Channing in writing today. She voiced understanding and agreement, and knows to call with any changes or problems.    Zionah Criswell    12/12/2011

## 2011-12-12 NOTE — Telephone Encounter (Signed)
Gave patient appointment for 2014

## 2012-02-01 ENCOUNTER — Ambulatory Visit (INDEPENDENT_AMBULATORY_CARE_PROVIDER_SITE_OTHER): Payer: Medicare Other | Admitting: Cardiovascular Disease

## 2012-02-01 ENCOUNTER — Encounter: Payer: Self-pay | Admitting: Cardiovascular Disease

## 2012-02-01 VITALS — BP 120/75 | HR 79 | Ht 63.0 in | Wt 160.0 lb

## 2012-02-01 DIAGNOSIS — C50919 Malignant neoplasm of unspecified site of unspecified female breast: Secondary | ICD-10-CM

## 2012-02-01 DIAGNOSIS — E785 Hyperlipidemia, unspecified: Secondary | ICD-10-CM

## 2012-02-01 DIAGNOSIS — I10 Essential (primary) hypertension: Secondary | ICD-10-CM

## 2012-02-01 DIAGNOSIS — I2510 Atherosclerotic heart disease of native coronary artery without angina pectoris: Secondary | ICD-10-CM

## 2012-02-01 NOTE — Assessment & Plan Note (Signed)
Continue statin labs with Dr Coletta Memos

## 2012-02-01 NOTE — Assessment & Plan Note (Signed)
Stable with no angina and good activity level.  Continue medical Rx  

## 2012-02-01 NOTE — Assessment & Plan Note (Signed)
Well controlled.  Continue current medications and low sodium Dash type diet.    

## 2012-02-01 NOTE — Patient Instructions (Addendum)
Your physician recommends that you continue on your current medications as directed. Please refer to the Current Medication list given to you today.  Your physician wants you to follow-up in: 1 year. You will receive a reminder letter in the mail two months in advance. If you don't receive a letter, please call our office to schedule the follow-up appointment.  

## 2012-02-01 NOTE — Progress Notes (Signed)
Patient ID: [name], female   DOB: 1940/06/28, 72 y.o.   MRN: [id number] 72 yo referred by Dr Coletta Memos  For  CAD, HTN and elevated lipids. Last seen 3 years ago She has been doing well. She has occasional SSCP maybe 1/week. She does not take nitro for it. It subsides spontaneously. It occasionally appears muscular in nature. She has a history of stent to the RCA Most recent cath in 03/2006 showed a patent RCA stent but the patient got sick with spasm of her LAD. Her last myovue was 09/21/08 and was non-ischemic Since I last saw her she has been Rx for breast CA by Dr Jana Hakim.  Had right lumpectomy with chemo and XRT  ON tamoxifen.  Daughter committed suicide and had two small children.  Labs followed by Dr Darien Ramus  ROS: Denies fever, malais, weight loss, blurry vision, decreased visual acuity, cough, sputum, SOB, hemoptysis, pleuritic pain, palpitaitons, heartburn, abdominal pain, melena, lower extremity edema, claudication, or rash.  All other systems reviewed and negative   General: Affect appropriate Healthy:  appears stated age 30: normal Neck supple with no adenopathy JVP normal no bruits no thyromegaly Lungs clear with no wheezing and good diaphragmatic motion Heart:  S1/S2 no murmur,rub, gallop or click PMI normal Abdomen: benighn, BS positve, no tenderness, no AAA no bruit.  No HSM or HJR Distal pulses intact with no bruits No edema Neuro non-focal Skin warm and dry No muscular weakness  Medications Current Outpatient Prescriptions  Medication Sig Dispense Refill  . aspirin 81 MG tablet Take 81 mg by mouth daily.      . BD PEN NEEDLE NANO U/F 32G X 4 MM MISC       . carvedilol (COREG) 12.5 MG tablet Take 12.5 mg by mouth 2 (two) times daily with a meal.      . cholecalciferol (VITAMIN D) 1000 UNITS tablet Take 1,000 Units by mouth daily.      . colesevelam (WELCHOL) 625 MG tablet Take 1,875 mg by mouth 2 (two) times daily with a meal.      . fish oil-omega-3 fatty acids  1000 MG capsule Take 1,200 mg by mouth 2 (two) times daily.      Marland Kitchen JANUVIA 50 MG tablet Take 50 mg by mouth daily.       Marland Kitchen LEVEMIR FLEXPEN 100 UNIT/ML injection as directed.       Marland Kitchen levothyroxine (SYNTHROID, LEVOTHROID) 50 MCG tablet Take 50 mcg by mouth daily.      Marland Kitchen lisinopril (PRINIVIL,ZESTRIL) 40 MG tablet Take 40 mg by mouth daily.      Marland Kitchen LIVALO 2 MG TABS Take 1 tablet by mouth daily.       Marland Kitchen LORazepam (ATIVAN) 0.5 MG tablet Take 1 tablet (0.5 mg total) by mouth at bedtime as needed for anxiety.  30 tablet  0  . pantoprazole (PROTONIX) 40 MG tablet Take 40 mg by mouth daily.      Marland Kitchen PARoxetine (PAXIL) 20 MG tablet Take 20 mg by mouth every morning.      . tamoxifen (NOLVADEX) 20 MG tablet Take 1 tablet (20 mg total) by mouth daily.  30 tablet  6    Allergies Codeine; Antihistamines, diphenhydramine-type; and Erythromycin  Family History: Family History  Problem Relation Age of Onset  . Cancer Son   . Cancer Father     throat  . Cancer Brother 49    lymphoma    Social History: History   Social History  .  Marital Status: Married    Spouse Name: N/A    Number of Children: N/A  . Years of Education: N/A   Occupational History  . retired    Social History Main Topics  . Smoking status: Former Smoker -- 2.0 packs/day for 40 years    Quit date: 12/19/1997  . Smokeless tobacco: Former Systems developer    Quit date: 01/22/1997  . Alcohol Use: 3.6 oz/week    6 Glasses of wine per week     Comment: occassional  . Drug Use: No  . Sexually Active: Not on file     Comment: menarch age 53, P3, HRT X 10 YRS, MENOPAUSE MID 40'S   Other Topics Concern  . Not on file   Social History Narrative   [name] age 23- [place]  [name]    Electrocardiogram:  12/27/10  SR rate 72 nonspecific ST/T wave changes  Today no changes compared to 2012  Rate 79 QT 408  Assessment and Plan

## 2012-02-01 NOTE — Assessment & Plan Note (Signed)
Still at risk for recurrence  Continue tamoxifen recent mamogram ok

## 2012-03-08 ENCOUNTER — Other Ambulatory Visit: Payer: Self-pay

## 2012-03-17 ENCOUNTER — Encounter: Payer: Self-pay | Admitting: Physician Assistant

## 2012-03-17 NOTE — Progress Notes (Signed)
The patient called today concerned about some pain in her right breast. This was present when she saw [place] back in November.  She had just had a bilateral diagnostic mammogram on 12/07/2011 which was unremarkable with only some scarring in the lower right breast identified. She had also had a chest x-ray earlier in the year.  After much questioning, the patient tells me she is "sure it is in the breast and not the right ribs". She tells me it is just lateral to the lumpectomy incision.  She feels no skin thickening or palpable nodules at all.  I offered the patient an appointment for followup, and also offered her a right diagnostic mammogram with ultrasound for further evaluation. We also discussed the likelihood of postsurgical pain associated with the right lumpectomy. I offered her a prescription for low-dose gabapentin, 100 mg by mouth 2-3 times daily. She declined all of the above at the current time. At this time, she is scheduled to see [place] again in 4 weeks. She would like to wait until that time and "see how it goes".  I encouraged her to contact us with any worsening symptoms, and she voices her agreement understanding.  Micah Flesher, PA-C 03/17/2012

## 2012-04-10 ENCOUNTER — Ambulatory Visit (HOSPITAL_BASED_OUTPATIENT_CLINIC_OR_DEPARTMENT_OTHER): Payer: Medicare Other | Admitting: Oncology

## 2012-04-10 ENCOUNTER — Telehealth: Payer: Self-pay | Admitting: *Deleted

## 2012-04-10 ENCOUNTER — Other Ambulatory Visit (HOSPITAL_BASED_OUTPATIENT_CLINIC_OR_DEPARTMENT_OTHER): Payer: Medicare Other | Admitting: Lab

## 2012-04-10 VITALS — BP 127/73 | HR 97 | Temp 98.1°F | Resp 20 | Ht 63.0 in | Wt 155.9 lb

## 2012-04-10 DIAGNOSIS — C50919 Malignant neoplasm of unspecified site of unspecified female breast: Secondary | ICD-10-CM

## 2012-04-10 DIAGNOSIS — C50511 Malignant neoplasm of lower-outer quadrant of right female breast: Secondary | ICD-10-CM

## 2012-04-10 DIAGNOSIS — Z170 Estrogen receptor positive status [ER+]: Secondary | ICD-10-CM

## 2012-04-10 DIAGNOSIS — C50519 Malignant neoplasm of lower-outer quadrant of unspecified female breast: Secondary | ICD-10-CM

## 2012-04-10 LAB — CBC WITH DIFFERENTIAL/PLATELET
Basophils Absolute: 0.1 10*3/uL (ref 0.0–0.1)
Eosinophils Absolute: 0.3 10*3/uL (ref 0.0–0.5)
HCT: 33.9 % — ABNORMAL LOW (ref 34.8–46.6)
HGB: 11.7 g/dL (ref 11.6–15.9)
MCV: 90 fL (ref 79.5–101.0)
MONO%: 11.5 % (ref 0.0–14.0)
NEUT#: 2.7 10*3/uL (ref 1.5–6.5)
NEUT%: 49.4 % (ref 38.4–76.8)
RDW: 13.7 % (ref 11.2–14.5)
lymph#: 1.8 10*3/uL (ref 0.9–3.3)

## 2012-04-10 LAB — COMPREHENSIVE METABOLIC PANEL (CC13)
Albumin: 3.2 g/dL — ABNORMAL LOW (ref 3.5–5.0)
BUN: 18.6 mg/dL (ref 7.0–26.0)
Calcium: 8.9 mg/dL (ref 8.4–10.4)
Chloride: 106 mEq/L (ref 98–107)
Glucose: 216 mg/dl — ABNORMAL HIGH (ref 70–99)
Potassium: 4.6 mEq/L (ref 3.5–5.1)

## 2012-04-10 MED ORDER — TAMOXIFEN CITRATE 20 MG PO TABS: 20.0000 mg | ORAL_TABLET | Freq: Every day | ORAL | Status: DC

## 2012-04-10 NOTE — Progress Notes (Signed)
ID: CONNYE DUGAR   DOB: 07-28-1940  MR#: [id number]  CSN#:[id number]  HISTORY OF PRESENT ILLNESS: The patient had a screening mammogram July of 2009, which was unremarkable. Screening mammography 2012 showed a possible mass in the right breast. Right diagnostic mammogram 12/05/2010 confirmed an irregularly marginated oval mass in the right breast at the 6:00 position measuring up to 1.4 cm. On physical exam Dr. [name] was not able to palpate a discrete abnormality, but ultrasound showed an irregularly marginated hypoechoic mass measuring 1.1 cm, and containing microcalcifications.  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 Estrogen receptors were expressed with 100% positivity, same for progesterone receptors, but there was no HER-2 amplification, with the ratio by CISH being 0.96. The MIB-1 was 96%.  With this information the patient underwent bilateral breast MRIs on 12/12/2010, showing a solitary 1.4 cm irregular enhancing mass in the right breast, but no other suspicious masses and no associated adenopathy.  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 Margins were negative. Repeat HER-2 was again negative.  Her subsequent history is as detailed below.  INTERVAL HISTORY:  Yadirah returns today for followup of her right breast carcinoma. The interval history is generally unremarkable. She started tamoxifen in December of 2013, and is tolerating that well, with no hot flashes or other side effects that she is aware of. She only pay $7 for that medication.  REVIEW OF SYSTEMS:  When she lies on her left side and tries to get up sometimes she gets to pull and pain in the right breast. She can prevent this by holding the breast up when she makes that motion. She discussed this  with our 16 assistant a few weeks ago and I reinforced today that this is a postoperative development, and does not indicate breast cancer in that area. She feels tired, but she is having some sleep disturbance. She doesn't get to bet sometimes and the one in the morning. She is anxious about one of her grandchildren who has ADD she says. Otherwise she has the usual arthritis aches and pains which are not more frequent or intense than before. She has been started on metformin, and has no diarrhea with that but says the papilla gives her some nausea. A detailed review of systems today was otherwise noncontributory.Marland Kitchen  PAST MEDICAL HISTORY: Past Medical History  Diagnosis Date  . Elevated cholesterol   . GERD (gastroesophageal reflux disease)   . Thyroid disease   . Hypertension   . Coronary artery disease     stent 2007  . Depression   . Diabetes mellitus   . Hypothyroidism   . Cancer     right breast  . 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 Arthritis   . Carcinoma of breast treated with adjuvant chemotherapy    PAST SURGICAL HISTORY: Past Surgical History  Procedure Laterality Date  . Cholecystectomy    . Knee surgery  2007  . Carotid stent  2007  . Carpal tunnel release  9/12,8,12    both done  . Dilation and curettage of uterus    . Coronary angioplasty    . Foot spurs  REMOVED  . Breast lumpectomy  1983    benign biopsy  . Breast surgery  12/29/2010    R  lumpectomy, snbx, ER/PR +, Her2 -, 0/1 node pos.  . Portacath placement  02/07/2011    Procedure: INSERTION PORT-A-CATH;  Surgeon: [name], MD;  Location: [facility];  Service: General;  Laterality: N/A;  . Port-a-cath removal  04/17/2011    Procedure: REMOVAL PORT-A-CATH;  Surgeon: [name], MD;  Location: [facility];  Service: General;  Laterality: Left;    FAMILY HISTORY Family History  Problem Relation Age of Onset  . Cancer Son   . Cancer  Father     throat  . Cancer Brother 32    lymphoma  The patient's father died at the age of 42 from cancer "in his temples". The patient's mother died from renal failure at the age of 60. The patient had 3 brothers, one of whom died at the age of 75 from non-Hodgkin's lymphoma. She has one sister. There is no history of breast or ovarian cancer in the family to her knowledge.    GYNECOLOGIC HISTORY: GX P3. Menarche age 22, last menstrual period in her mid 46s.She used hormone replacement for about 10 years, discontinuing that in 2002.   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 One daughter died from suicide at the age of 55 in the setting of postpartum depression. A second daughter, [name], works in [place] as a Art therapist. Son Chanya Morison 36 is a Quarry manager in [place]. The patient has 7 grandchildren. She attends a [employer].    ADVANCED DIRECTIVES: in place  HEALTH MAINTENANCE: History  Substance Use Topics  . Smoking status: Former Smoker -- 2.00 packs/day for 40 years    Quit date: 12/19/1997  . Smokeless tobacco: Former Systems developer    Quit date: 01/22/1997  . Alcohol Use: 3.6 oz/week    6 Glasses of wine per week     Comment: occassional    Cholesterol -- under treatment  Bone density Nov 2012 at [facility], "normal"  Colonoscopy 2002  (PAP) Nov 2012    Allergies  Allergen Reactions  . Codeine Nausea And Vomiting and Other (See Comments)    Severe stomach cramps  . Antihistamines, Diphenhydramine-Type Other (See Comments)    Causes hyperactivity  . Erythromycin Hives and Rash    Due to dental work about 50 years ago. Was used in packing and resulted in rash/hives inside and outside of mouth.    Current Outpatient Prescriptions  Medication Sig Dispense Refill  . aspirin 81 MG tablet Take 81 mg by mouth daily.      . BD PEN NEEDLE NANO U/F 32G X 4 MM MISC       . carvedilol (COREG) 12.5 MG  tablet Take 12.5 mg by mouth 2 (two) times daily with a meal.      . cholecalciferol (VITAMIN D) 1000 UNITS tablet Take 1,000 Units by mouth daily.      . Chromium-Cinnamon (CINNAMON PLUS CHROMIUM PO) Take 1 tablet by mouth 2 (two) times daily.      . colesevelam (WELCHOL) 625 MG tablet Take 1,875 mg by mouth 2 (two) times daily with a meal.      . fish oil-omega-3 fatty acids 1000 MG capsule Take 1,200 mg by mouth 2 (two) times daily.      Marland Kitchen JANUVIA 50 MG tablet Take 50 mg by mouth daily.       Marland Kitchen LEVEMIR FLEXPEN 100 UNIT/ML injection as directed.       Marland Kitchen levothyroxine (SYNTHROID, LEVOTHROID) 50  MCG tablet Take 50 mcg by mouth daily.      Marland Kitchen lisinopril (PRINIVIL,ZESTRIL) 40 MG tablet Take 40 mg by mouth daily.      Marland Kitchen LORazepam (ATIVAN) 0.5 MG tablet Take 1 tablet (0.5 mg total) by mouth at bedtime as needed for anxiety.  30 tablet  0  . pantoprazole (PROTONIX) 40 MG tablet Take 40 mg by mouth daily.      Marland Kitchen PARoxetine (PAXIL) 20 MG tablet Take 20 mg by mouth every morning.      . tamoxifen (NOLVADEX) 20 MG tablet Take 1 tablet (20 mg total) by mouth daily.  30 tablet  6   No current facility-administered medications for this visit.    OBJECTIVE: Middle-aged white woman in no acute distress Filed Vitals:   04/10/12 0931  BP: 127/73  Pulse: 97  Temp: 98.1 F (36.7 C)  Resp: 20     Body mass index is 27.62 kg/(m^2).    ECOG FS: 1  Filed Weights   04/10/12 0931  Weight: 155 lb 14.4 oz (70.716 kg)   Sclerae unicteric Oropharynx clear No cervical or supraclavicular adenopathy Lungs no rales or rhonchi Heart regular rate and rhythm Abd benign MSK kyphosis and moderate scoliosis but no focal spinal tenderness, no peripheral edema Neuro: nonfocal, well oriented, pleasant affect Breasts: The right breast is status post lumpectomy. There is no evidence of local recurrence. The right axilla is benign. The left breast is unremarkable.    LAB RESULTS: Lab Results  Component Value Date    WBC 5.4 04/10/2012   NEUTROABS 2.7 04/10/2012   HGB 11.7 04/10/2012   HCT 33.9* 04/10/2012   MCV 90.0 04/10/2012   PLT 214 04/10/2012      Chemistry      Component Value Date/Time   NA 140 08/20/2011 1052   K 4.5 08/20/2011 1052   CL 106 08/20/2011 1052   CO2 24 08/20/2011 1052   BUN 20 08/20/2011 1052   CREATININE 1.07 08/20/2011 1052      Component Value Date/Time   CALCIUM 9.2 08/20/2011 1052   ALKPHOS 62 08/20/2011 1052   AST 16 08/20/2011 1052   ALT 15 08/20/2011 1052   BILITOT 0.4 08/20/2011 1052        STUDIES: Mammogram at the [facility] on 12/07/11 was unremarkable.   ASSESSMENT:71 y.o.  [place] woman   (1) status post right lumpectomy and sentinel lymph node biopsy November of 2012 for a pT1c pN0, Stage IA invasive ductal carcinoma, grade 3, strongly estrogen and progesterone receptor positive, HER-2 not amplified, with an MIB-1 of 96%. Her Oncotype recurrence score predicted a 14% risk of distant recurrence if her only systemic adjuvant therapy is tamoxifen for five years.   (2) status post two cycles of adjuvant docetaxel/cyclophosphamide, completed February 2013, the final two cycles omitted because of poor tolerance  (3) status post radiation completed April 2013  (4)  on anastrozole briefly in May 2013, discontinued secondary to side effects  (5)  on letrozole briefly in early August 2013, discontinued secondary to side effects which included nausea and headaches  (6)  on exemestane between September and November 2013, discontinued doe to arthralgias  (7)  started on tamoxifen beginning in December 2013  PLAN:  We have finally found an antiestrogen that date he can tolerate, and the plan is going to be to continue tamoxifen for total of 5 years. We will start the count from December of 2013 which means we will continue through December of  2018. She will have her next mammogram in November, and we will see her shortly after that. We will start yearly followup from  that point.  She requested that we mailed her tamoxifen prescription to [facility] [phone] in [place] [address]. We will be glad to do that for her.   MAGRINAT,GUSTAV C    04/10/2012

## 2012-04-10 NOTE — Telephone Encounter (Signed)
appts made and printed 

## 2012-04-16 DIAGNOSIS — M154 Erosive (osteo)arthritis: Secondary | ICD-10-CM | POA: Insufficient documentation

## 2012-04-16 DIAGNOSIS — J984 Other disorders of lung: Secondary | ICD-10-CM | POA: Insufficient documentation

## 2012-04-16 DIAGNOSIS — M1710 Unilateral primary osteoarthritis, unspecified knee: Secondary | ICD-10-CM | POA: Insufficient documentation

## 2012-04-16 DIAGNOSIS — F411 Generalized anxiety disorder: Secondary | ICD-10-CM | POA: Insufficient documentation

## 2012-04-16 DIAGNOSIS — M19049 Primary osteoarthritis, unspecified hand: Secondary | ICD-10-CM | POA: Insufficient documentation

## 2012-04-16 DIAGNOSIS — B009 Herpesviral infection, unspecified: Secondary | ICD-10-CM | POA: Insufficient documentation

## 2012-09-10 ENCOUNTER — Encounter (HOSPITAL_COMMUNITY): Payer: Self-pay | Admitting: Obstetrics and Gynecology

## 2012-09-10 NOTE — H&P (Addendum)
72 yo WF with incidental finding of endometrial fluid on CT scan.  Pt with h/o breast cancer, taking tamoxifen.  Denies PMB.  PMHx:  HTN, DM, breast ca, hypothyroidism, arthritis, heart disease (stent '07), hypercholesterol PSHx:   All:  Statins, emycin, codeine,  SHx:  No tobacco, etoh Meds:  See list  AF, VSS GEn - NAD CV - RRR Lungs - clear Abd - soft, NT PV - uterus mobile, NT.  cvx stenotic  A/P:  Uterus without masses, EMS 60mm, 2.6cm fluid collection at fundus.  Normal adnexa  A/P:  Endometrial fluid, h/o breast cancer on tamoxifen Hysteroscopy, D&C R/b/a discussed, informed consent

## 2012-09-11 ENCOUNTER — Encounter (HOSPITAL_COMMUNITY): Payer: Self-pay | Admitting: Registered Nurse

## 2012-09-11 ENCOUNTER — Encounter (HOSPITAL_COMMUNITY)
Admission: RE | Disposition: A | Discharge status: Home / Self Care | Payer: Self-pay | Source: Ambulatory Visit | Attending: Obstetrics and Gynecology

## 2012-09-11 ENCOUNTER — Other Ambulatory Visit: Payer: Self-pay

## 2012-09-11 ENCOUNTER — Encounter (HOSPITAL_COMMUNITY): Payer: Self-pay | Admitting: Anesthesiology

## 2012-09-11 ENCOUNTER — Ambulatory Visit (HOSPITAL_COMMUNITY): Payer: Medicare Other | Admitting: Anesthesiology

## 2012-09-11 ENCOUNTER — Ambulatory Visit (HOSPITAL_COMMUNITY)
Admission: RE | Admit: 2012-09-11 | Discharge: 2012-09-11 | Disposition: A | Discharge status: Home / Self Care | Payer: Medicare Other | Source: Ambulatory Visit | Attending: Obstetrics and Gynecology | Admitting: Obstetrics and Gynecology

## 2012-09-11 DIAGNOSIS — N859 Noninflammatory disorder of uterus, unspecified: Secondary | ICD-10-CM | POA: Insufficient documentation

## 2012-09-11 DIAGNOSIS — Z853 Personal history of malignant neoplasm of breast: Secondary | ICD-10-CM | POA: Insufficient documentation

## 2012-09-11 DIAGNOSIS — R9389 Abnormal findings on diagnostic imaging of other specified body structures: Secondary | ICD-10-CM | POA: Insufficient documentation

## 2012-09-11 HISTORY — PX: HYSTEROSCOPY WITH D & C: SHX1775

## 2012-09-11 HISTORY — DX: Cardiac murmur, unspecified: R01.1

## 2012-09-11 HISTORY — DX: Anemia, unspecified: D64.9

## 2012-09-11 LAB — BASIC METABOLIC PANEL
CO2: 25 mEq/L (ref 19–32)
Calcium: 9 mg/dL (ref 8.4–10.5)
Creatinine, Ser: 1.33 mg/dL — ABNORMAL HIGH (ref 0.50–1.10)
GFR calc Af Amer: 46 mL/min — ABNORMAL LOW (ref >90)

## 2012-09-11 LAB — CBC
MCHC: 34.3 g/dL (ref 30.0–36.0)
MCV: 90.8 fL (ref 78.0–100.0)
Platelets: 240 10*3/uL (ref 150–400)
RDW: 13 % (ref 11.5–15.5)
WBC: 6.7 10*3/uL (ref 4.0–10.5)

## 2012-09-11 SURGERY — DILATATION AND CURETTAGE /HYSTEROSCOPY
Anesthesia: General | Site: Vagina | Wound class: Clean Contaminated

## 2012-09-11 MED ORDER — LIDOCAINE HCL (CARDIAC) 20 MG/ML IV SOLN
INTRAVENOUS | Status: DC | PRN
  Administered 2012-09-11: 50 mg via INTRAVENOUS

## 2012-09-11 MED ORDER — MIDAZOLAM HCL 5 MG/5ML IJ SOLN
INTRAMUSCULAR | Status: DC | PRN
  Administered 2012-09-11: 2 mg via INTRAVENOUS

## 2012-09-11 MED ORDER — ONDANSETRON HCL 4 MG/2ML IJ SOLN
INTRAMUSCULAR | Status: DC | PRN
  Administered 2012-09-11: 4 mg via INTRAVENOUS

## 2012-09-11 MED ORDER — FENTANYL CITRATE 0.05 MG/ML IJ SOLN: 25.0000 ug | INTRAMUSCULAR | Status: DC | PRN

## 2012-09-11 MED ORDER — FENTANYL CITRATE 0.05 MG/ML IJ SOLN
INTRAMUSCULAR | Status: AC
  Filled 2012-09-11: qty 2

## 2012-09-11 MED ORDER — KETOROLAC TROMETHAMINE 30 MG/ML IJ SOLN
INTRAMUSCULAR | Status: DC | PRN
  Administered 2012-09-11: 15 mg via INTRAVENOUS

## 2012-09-11 MED ORDER — EPHEDRINE SULFATE 50 MG/ML IJ SOLN
INTRAMUSCULAR | Status: DC | PRN
  Administered 2012-09-11 (×2): 15 mg via INTRAVENOUS

## 2012-09-11 MED ORDER — LACTATED RINGERS IV SOLN
INTRAVENOUS | Status: DC
  Administered 2012-09-11 (×2): via INTRAVENOUS

## 2012-09-11 MED ORDER — KETOROLAC TROMETHAMINE 30 MG/ML IJ SOLN
INTRAMUSCULAR | Status: AC
  Filled 2012-09-11: qty 1

## 2012-09-11 MED ORDER — IBUPROFEN 200 MG PO TABS: 600.0000 mg | ORAL_TABLET | Freq: Four times a day (QID) | ORAL | Status: DC | PRN

## 2012-09-11 MED ORDER — ONDANSETRON HCL 4 MG/2ML IJ SOLN
INTRAMUSCULAR | Status: AC
  Filled 2012-09-11: qty 2

## 2012-09-11 MED ORDER — PROPOFOL 10 MG/ML IV BOLUS
INTRAVENOUS | Status: DC | PRN
  Administered 2012-09-11: 170 mg via INTRAVENOUS
  Administered 2012-09-11: 30 mg via INTRAVENOUS

## 2012-09-11 MED ORDER — FENTANYL CITRATE 0.05 MG/ML IJ SOLN
INTRAMUSCULAR | Status: DC | PRN
  Administered 2012-09-11 (×2): 50 ug via INTRAVENOUS

## 2012-09-11 MED ORDER — DEXAMETHASONE SODIUM PHOSPHATE 10 MG/ML IJ SOLN
INTRAMUSCULAR | Status: AC
  Filled 2012-09-11: qty 1

## 2012-09-11 MED ORDER — LIDOCAINE HCL 1 % IJ SOLN
INTRAMUSCULAR | Status: DC | PRN
  Administered 2012-09-11: 10 mL

## 2012-09-11 MED ORDER — MIDAZOLAM HCL 2 MG/2ML IJ SOLN
INTRAMUSCULAR | Status: AC
  Filled 2012-09-11: qty 2

## 2012-09-11 MED ORDER — EPHEDRINE 5 MG/ML INJ
INTRAVENOUS | Status: AC
  Filled 2012-09-11: qty 10

## 2012-09-11 MED ORDER — LIDOCAINE HCL (CARDIAC) 20 MG/ML IV SOLN
INTRAVENOUS | Status: AC
  Filled 2012-09-11: qty 5

## 2012-09-11 MED ORDER — PROPOFOL 10 MG/ML IV EMUL
INTRAVENOUS | Status: AC
  Filled 2012-09-11: qty 20

## 2012-09-11 SURGICAL SUPPLY — 21 items
ABLATOR ENDOMETRIAL BIPOLAR (ABLATOR) IMPLANT
CANISTER SUCTION 2500CC (MISCELLANEOUS) ×2 IMPLANT
CATH ROBINSON RED A/P 16FR (CATHETERS) ×2 IMPLANT
CATH THERMACHOICE III (CATHETERS) IMPLANT
CLOTH BEACON ORANGE TIMEOUT ST (SAFETY) ×2 IMPLANT
CONTAINER PREFILL 10% NBF 60ML (FORM) ×4 IMPLANT
DILATOR CANAL MILEX (MISCELLANEOUS) ×2 IMPLANT
DRESSING TELFA 8X3 (GAUZE/BANDAGES/DRESSINGS) ×2 IMPLANT
ELECT REM PT RETURN 9FT ADLT (ELECTROSURGICAL) ×2
ELECTRODE REM PT RTRN 9FT ADLT (ELECTROSURGICAL) ×1 IMPLANT
GAUZE SPONGE 4X4 16PLY XRAY LF (GAUZE/BANDAGES/DRESSINGS) ×2 IMPLANT
GLOVE BIO SURGEON STRL SZ 6.5 (GLOVE) ×2 IMPLANT
GLOVE BIOGEL PI IND STRL 7.0 (GLOVE) ×1 IMPLANT
GLOVE BIOGEL PI INDICATOR 7.0 (GLOVE) ×1
GOWN STRL REIN XL XLG (GOWN DISPOSABLE) ×4 IMPLANT
LOOP ANGLED CUTTING 22FR (CUTTING LOOP) IMPLANT
PACK HYSTEROSCOPY LF (CUSTOM PROCEDURE TRAY) ×2 IMPLANT
PAD OB MATERNITY 4.3X12.25 (PERSONAL CARE ITEMS) ×2 IMPLANT
PIPET BIOPSY ENDOMETRIAL 3MM (SUCTIONS) ×2 IMPLANT
TOWEL OR 17X24 6PK STRL BLUE (TOWEL DISPOSABLE) ×4 IMPLANT
WATER STERILE IRR 1000ML POUR (IV SOLUTION) ×2 IMPLANT

## 2012-09-11 NOTE — Preoperative (Signed)
Beta Blockers   Reason not to administer Beta Blockers:Not Applicable 

## 2012-09-11 NOTE — Anesthesia Postprocedure Evaluation (Signed)
  Anesthesia Post Note  Patient: [name]  Procedure(s) Performed: Procedure(s) (LRB): DILATATION AND CURETTAGE  (N/A)  Anesthesia type: GA  Patient location: PACU  Post pain: Pain level controlled  Post assessment: Post-op Vital signs reviewed  Last Vitals:  Filed Vitals:   09/11/12 0845  BP: 126/41  Pulse: 83  Temp:   Resp: 15    Post vital signs: Reviewed  Level of consciousness: sedated  Complications: No apparent anesthesia complications

## 2012-09-11 NOTE — Anesthesia Preprocedure Evaluation (Addendum)
Anesthesia Evaluation  Patient identified by MRN, date of birth, ID band Patient awake    Reviewed: Allergy & Precautions, H&P , Patient's Chart, lab work & pertinent test results, reviewed documented beta blocker date and time   Airway Mallampati: II TM Distance: >3 FB Neck ROM: full    Dental no notable dental hx.    Pulmonary  breath sounds clear to auscultation  Pulmonary exam normal       Cardiovascular hypertension, On Medications Rhythm:regular Rate:Normal     Neuro/Psych    GI/Hepatic GERD-  ,  Endo/Other  diabetes, Type 2  Renal/GU      Musculoskeletal   Abdominal   Peds  Hematology   Anesthesia Other Findings   Reproductive/Obstetrics                           Anesthesia Physical Anesthesia Plan  ASA: II  Anesthesia Plan:    Post-op Pain Management:    Induction: Intravenous  Airway Management Planned: LMA  Additional Equipment:   Intra-op Plan:   Post-operative Plan:   Informed Consent: I have reviewed the patients History and Physical, chart, labs and discussed the procedure including the risks, benefits and alternatives for the proposed anesthesia with the patient or authorized representative who has indicated his/her understanding and acceptance.   Dental Advisory Given and Dental advisory given  Plan Discussed with: CRNA and Surgeon  Anesthesia Plan Comments:         Anesthesia Quick Evaluation

## 2012-09-11 NOTE — Op Note (Signed)
NAMELEARLINE, FREEBERG NO.:  [id number]  MEDICAL RECORD NO.:  [id number]  LOCATION:  [facility]                          FACILITY:  [facility]  PHYSICIAN:  [name], MD    DATE OF BIRTH:  07/15/1940  DATE OF PROCEDURE: DATE OF DISCHARGE:                              OPERATIVE REPORT   PREOPERATIVE DIAGNOSES: 1. Endometrial fluid. 2. History of breast cancer, on tamoxifen.  POSTOPERATIVE DIAGNOSES: 1. Endometrial fluid. 2. History of breast cancer, on tamoxifen. 3. Path pending.  PROCEDURE: 1. Dilation and curettage. 2. Cervical block.  SURGEON:  [name], MD  ANESTHESIA:  General.  SPECIMEN:  Endometrial curettings.  COMPLICATIONS:  None.  CONDITION:  Stable to recovery room.  DESCRIPTION OF PROCEDURE:  The patient was taken to the operating room. After informed consent was obtained, she was prepped and draped in sterile fashion.  An in and out catheter was used to drain her bladder. Bivalve speculum was placed in the vagina and 10 mL of 1% lidocaine was used to perform a cervical block.  Single-tooth tenaculum was attached to the anterior lip of the cervix.  The half Hegar dilators were used to dilate the cervix.  The cervix was significantly stenotic, however, the dilator did touch the back wall of the uterus.  I was unable to dilate enough to allow a curette or the hysteroscope into the endometrial cavity.  The endometrial Pipelle was inserted into the cavity and 3 passes were performed in a clockwise fashion.  Specimen was placed on Telfa and passed off to be sent to Pathology.  The tenaculum was removed.  The cervix was hemostatic.  Speculum was removed.  She was taken to the recovery room in stable condition.  Sponge, lap, needle, and instrument counts were correct x2.     [name], MD     GA/MEDQ  D:  09/11/2012  T:  09/11/2012  Job:  [id number]

## 2012-09-11 NOTE — Transfer of Care (Signed)
Immediate Anesthesia Transfer of Care Note  Patient: [name]  Procedure(s) Performed: Procedure(s): DILATATION AND CURETTAGE  (N/A)  Patient Location: PACU  Anesthesia Type:General  Level of Consciousness: sedated  Airway & Oxygen Therapy: Patient Spontanous Breathing and Patient connected to nasal cannula oxygen  Post-op Assessment: Report given to PACU RN  Post vital signs: Reviewed  Complications: No apparent anesthesia complications

## 2012-09-12 ENCOUNTER — Encounter (HOSPITAL_COMMUNITY): Payer: Self-pay | Admitting: Obstetrics and Gynecology

## 2012-10-21 ENCOUNTER — Encounter: Payer: Self-pay | Admitting: Internal Medicine

## 2012-10-22 ENCOUNTER — Encounter: Payer: Self-pay | Admitting: *Deleted

## 2012-11-04 ENCOUNTER — Encounter: Payer: Self-pay | Admitting: *Deleted

## 2012-11-10 ENCOUNTER — Other Ambulatory Visit: Payer: Self-pay | Admitting: Obstetrics and Gynecology

## 2012-11-10 DIAGNOSIS — Z9889 Other specified postprocedural states: Secondary | ICD-10-CM

## 2012-11-27 ENCOUNTER — Other Ambulatory Visit: Payer: Self-pay

## 2012-12-08 ENCOUNTER — Ambulatory Visit
Admission: RE | Admit: 2012-12-08 | Discharge: 2012-12-08 | Disposition: A | Discharge status: Home / Self Care | Payer: Medicare Other | Source: Ambulatory Visit | Attending: Obstetrics and Gynecology | Admitting: Obstetrics and Gynecology

## 2012-12-08 DIAGNOSIS — Z9889 Other specified postprocedural states: Secondary | ICD-10-CM

## 2012-12-17 ENCOUNTER — Other Ambulatory Visit: Payer: Self-pay | Admitting: Obstetrics and Gynecology

## 2012-12-17 DIAGNOSIS — Z853 Personal history of malignant neoplasm of breast: Secondary | ICD-10-CM

## 2012-12-17 DIAGNOSIS — N644 Mastodynia: Secondary | ICD-10-CM

## 2012-12-22 ENCOUNTER — Encounter: Payer: Self-pay | Admitting: Physician Assistant

## 2012-12-22 ENCOUNTER — Ambulatory Visit (HOSPITAL_BASED_OUTPATIENT_CLINIC_OR_DEPARTMENT_OTHER): Payer: Medicare Other | Admitting: Physician Assistant

## 2012-12-22 ENCOUNTER — Telehealth: Payer: Self-pay | Admitting: Oncology

## 2012-12-22 ENCOUNTER — Ambulatory Visit (HOSPITAL_BASED_OUTPATIENT_CLINIC_OR_DEPARTMENT_OTHER): Payer: Medicare Other | Admitting: Lab

## 2012-12-22 VITALS — BP 103/66 | HR 86 | Temp 98.3°F | Resp 18 | Ht 63.0 in | Wt 151.3 lb

## 2012-12-22 DIAGNOSIS — N644 Mastodynia: Secondary | ICD-10-CM

## 2012-12-22 DIAGNOSIS — C50911 Malignant neoplasm of unspecified site of right female breast: Secondary | ICD-10-CM

## 2012-12-22 DIAGNOSIS — K219 Gastro-esophageal reflux disease without esophagitis: Secondary | ICD-10-CM

## 2012-12-22 DIAGNOSIS — C50919 Malignant neoplasm of unspecified site of unspecified female breast: Secondary | ICD-10-CM

## 2012-12-22 DIAGNOSIS — Z170 Estrogen receptor positive status [ER+]: Secondary | ICD-10-CM

## 2012-12-22 DIAGNOSIS — Z853 Personal history of malignant neoplasm of breast: Secondary | ICD-10-CM

## 2012-12-22 DIAGNOSIS — R922 Inconclusive mammogram: Secondary | ICD-10-CM

## 2012-12-22 LAB — COMPREHENSIVE METABOLIC PANEL (CC13)
ALT: 15 U/L (ref 0–55)
Albumin: 3.3 g/dL — ABNORMAL LOW (ref 3.5–5.0)
Alkaline Phosphatase: 49 U/L (ref 40–150)
Anion Gap: 12 mEq/L — ABNORMAL HIGH (ref 3–11)
CO2: 21 mEq/L — ABNORMAL LOW (ref 22–29)
Potassium: 4 mEq/L (ref 3.5–5.1)
Sodium: 139 mEq/L (ref 136–145)
Total Bilirubin: 0.31 mg/dL (ref 0.20–1.20)
Total Protein: 6.3 g/dL — ABNORMAL LOW (ref 6.4–8.3)

## 2012-12-22 LAB — CBC WITH DIFFERENTIAL/PLATELET
BASO%: 1.4 % (ref 0.0–2.0)
LYMPH%: 38.7 % (ref 14.0–49.7)
MCHC: 32.9 g/dL (ref 31.5–36.0)
MONO#: 0.4 10*3/uL (ref 0.1–0.9)
NEUT#: 1.9 10*3/uL (ref 1.5–6.5)
Platelets: 191 10*3/uL (ref 145–400)
RBC: 3.87 10*6/uL (ref 3.70–5.45)
RDW: 13.6 % (ref 11.2–14.5)
WBC: 4.6 10*3/uL (ref 3.9–10.3)

## 2012-12-22 NOTE — Progress Notes (Signed)
ID: [name]   DOB: 01/12/1941  MR#: [id number]  CSN#:[id number]  PCP:  [name] Inches, MD GYN:  [name] Pearson, MD SUR:  [name] Bookbinder, MD [place]:  [name] Rudd, MD OTHER:  [name] Edis, MD;  [name] Rouge, MD  CHIEF COMPLAINT:  Right Breast Cancer    HISTORY OF PRESENT ILLNESS: The patient had a screening mammogram July of 2009, which was unremarkable. Screening mammography 2012 showed a possible mass in the right breast. Right diagnostic mammogram 12/05/2010 confirmed an irregularly marginated oval mass in the right breast at the 6:00 position measuring up to 1.4 cm. On physical exam Dr. Glennon [name] was not able to palpate a discrete abnormality, but ultrasound showed an irregularly marginated hypoechoic mass measuring 1.1 cm, and containing microcalcifications.  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 Estrogen receptors were expressed with 100% positivity, same for progesterone receptors, but there was no HER-2 amplification, with the ratio by CISH being 0.96. The MIB-1 was 96%.  With this information the patient underwent bilateral breast MRIs on 12/12/2010, showing a solitary 1.4 cm irregular enhancing mass in the right breast, but no other suspicious masses and no associated adenopathy.  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 Margins were negative. Repeat HER-2 was again negative.  Her subsequent history is as detailed below.  INTERVAL HISTORY:  [name] returns alone today for followup of her right breast carcinoma. She continues on tamoxifen which she began in December 2013.   The interval history is remarkable for [name] having had some abdominal pain and gastrointestinal issues over the past year. In fact, a  D&C was performed in August, but  fortunately pathology was benign.  (She tells me they did a D&C because they were unable to get through the cervix to do an endometrial biopsy, and they were concerned about the abdominal pain. She denies any abnormal vaginal bleeding.)  She is also scheduled for an endoscopy under the care of Dr. Olevia [name] next week.  In addition, [name] continues to have some pain in the lateral portion of her right breast. She feels like this is a "deep" pain. A recent mammogram was unremarkable, and she tells me Dr. Julien [name] is also ordered an ultrasound of this region. Recent mammogram also showed that she had dense breast tissue bilaterally which might make it difficult to see masses or abnormalities in the breasts.   REVIEW OF SYSTEMS:  Has had no fevers, chills, night sweats, or significant hot flashes. She denies any vaginal dryness or discharge. Her energy level is fair. Her appetite is fair. She denies any actual nausea or emesis. She's having regular bowel movements. She denies any change in urinary habits. She's had no cough, increased shortness of breath, chest pain, or palpitations. She's had no abnormal headaches or dizziness and denies any additional myalgias, arthralgias, or bony pain. She's had no peripheral swelling.  A detailed review of systems is otherwise stable and noncontributory.   PAST MEDICAL HISTORY: Past Medical History  Diagnosis Date  . Elevated cholesterol   . GERD (gastroesophageal reflux disease)   . Thyroid disease   . Hypertension   . Coronary artery disease     stent 2007  . Depression   . Diabetes mellitus   . Hypothyroidism   . Breast cancer 12/05/10    R breast, inv mammary,  in situ,ER/PR +,HER2 -  . Arthritis   . Carcinoma of breast treated with adjuvant chemotherapy   . Heart murmur     secondary to chem. 2013  . Anemia     childhood  . Lung nodule   . Osteoarthritis   . Shingles   . HSV-1 (herpes simplex virus 1) infection   . Hyperlipidemia   . Anxiety   .  Internal hemorrhoids 12/10/03  . Diverticulosis 12/10/03  . 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    Dr [name]  . Cancer    PAST SURGICAL HISTORY: Past Surgical History  Procedure Laterality Date  . Cholecystectomy    . Knee surgery  2007  . Carotid stent  2007  . Carpal tunnel release Bilateral 9/12,8,12  . Dilation and curettage of uterus    . Coronary angioplasty    . Foot spurs    . Breast lumpectomy  1983    benign biopsy  . Breast lumpectomy Right 12/29/2010    snbx, ER/PR +, Her2 -, 0/1 node pos.  . Portacath placement  02/07/2011    Procedure: INSERTION PORT-A-CATH;  Surgeon: [name] Bookbinder, MD;  Location: [facility];  Service: General;  Laterality: N/A;  . Port-a-cath removal  04/17/2011    Procedure: REMOVAL PORT-A-CATH;  Surgeon: [name] Bookbinder, MD;  Location: [facility];  Service: General;  Laterality: Left;  . Hysteroscopy w/d&c N/A 09/11/2012    Procedure: DILATATION AND CURETTAGE ;  Surgeon: [name] Pearson, MD;  Location: [facility];  Service: Gynecology;  Laterality: N/A;    FAMILY HISTORY Family History  Problem Relation Age of Onset  . Cancer Son   . Throat cancer Father   . Lymphoma Brother 18  . Colon cancer Neg Hx   . Heart disease Mother   . Alcoholism Father   . Heart attack Father   . Heart attack Brother   . Diabetes Brother   The patient's father died at the age of 9 from cancer "in his temples". The patient's mother died from renal failure at the age of 66. The patient had 3 brothers, one of whom died at the age of 57 from non-Hodgkin's lymphoma. She has one sister. There is no history of breast or ovarian cancer in the family to her knowledge.    GYNECOLOGIC HISTORY: GX P3. Menarche age 32, last menstrual period in her mid 27s.She used hormone replacement for about 10 years, discontinuing that in 2002.   SOCIAL HISTORY: (Updated 12/22/2012) She is a retired Radiation protection practitioner. Her husband [name] is a retired Quarry manager. One daughter  died from suicide at the age of 44 in the setting of postpartum depression. A second daughter, [name], works in [place] as a Art therapist. Son [name] 36 is a Quarry manager in [place]. The patient has 7 grandchildren. She attends a [employer].    ADVANCED DIRECTIVES: in place  HEALTH MAINTENANCE:  (Updated 12/22/2012) History  Substance Use Topics  . Smoking status: Former Smoker -- 2.00 packs/day for 40 years    Quit date: 12/19/1997  . Smokeless tobacco: Former Systems developer    Quit date: 01/22/1997  . Alcohol Use: 3.6 oz/week    6 Glasses of wine per week     Comment: occasional    Cholesterol -- under treatment/[name]  Bone density:  Nov 2012 at [facility], "normal"  Colonoscopy:  UTD/[facility] PAP:  November 2014, [facility]    Allergies  Allergen Reactions  . Codeine Nausea And Vomiting and Other (See  Comments)    Severe stomach cramps  . Antihistamines, Diphenhydramine-Type Other (See Comments)    Causes hyperactivity  . Statins Other (See Comments)    Joint pains  . Erythromycin Hives and Rash    Due to dental work about 50 years ago. Was used in packing and resulted in rash/hives inside and outside of mouth.    Current Outpatient Prescriptions  Medication Sig Dispense Refill  . aspirin 81 MG tablet Take 81 mg by mouth daily.      . BD PEN NEEDLE NANO U/F 32G X 4 MM MISC       . carvedilol (COREG) 12.5 MG tablet Take 12.5 mg by mouth 2 (two) times daily with a meal.      . Chromium-Cinnamon (CINNAMON PLUS CHROMIUM PO) Take 1 tablet by mouth 2 (two) times daily.      . colesevelam (WELCHOL) 625 MG tablet Take 1,875 mg by mouth 2 (two) times daily with a meal.      . fish oil-omega-3 fatty acids 1000 MG capsule Take 1,200 mg by mouth 2 (two) times daily.      Marland Kitchen ibuprofen (ADVIL) 200 MG tablet Take 3 tablets (600 mg total) by mouth every 6 (six) hours as needed for pain.  30 tablet  0  . JANUVIA 50 MG tablet Take 50 mg by mouth daily.       Marland Kitchen  levothyroxine (SYNTHROID, LEVOTHROID) 50 MCG tablet Take 50 mcg by mouth daily.      Marland Kitchen lisinopril (PRINIVIL,ZESTRIL) 40 MG tablet Take 40 mg by mouth daily.      . metFORMIN (GLUCOPHAGE) 1000 MG tablet       . pantoprazole (PROTONIX) 40 MG tablet Take 40 mg by mouth daily.      Marland Kitchen PARoxetine (PAXIL) 20 MG tablet Take 20 mg by mouth every morning.      . tamoxifen (NOLVADEX) 20 MG tablet Take 1 tablet (20 mg total) by mouth daily.  90 tablet  4  . cholecalciferol (VITAMIN D) 1000 UNITS tablet Take 1,000 Units by mouth daily.       No current facility-administered medications for this visit.    OBJECTIVE: Middle-aged white woman in no acute distress Filed Vitals:   12/22/12 0927  BP: 103/66  Pulse: 86  Temp: 98.3 F (36.8 C)  Resp: 18     Body mass index is 26.81 kg/(m^2).    ECOG FS: 1 Filed Weights   12/22/12 0927  Weight: 151 lb 4.8 oz (68.629 kg)   Physical Exam: HEENT:  Sclerae anicteric.  Oropharynx clear. Fair dentition. Buccal mucosa is pink and moist. No pharyngeal erythema. NODES:  No cervical or supraclavicular lymphadenopathy palpated.  BREAST EXAM: Breast tissue is dense to palpation bilaterally. Right breast is status post lumpectomy. There is tenderness to palpation along the lateral portion of the breast, but no palpable nodules. No skin changes. Left breast is unremarkable. Axillae are benign bilaterally, no palpable lymphadenopathy. LUNGS:  Clear to auscultation bilaterally.  No wheezes or rhonchi HEART:  Regular rate and rhythm. No murmur appreciated. ABDOMEN:  Soft, nontender.  Positive bowel sounds.  MSK:  No focal spinal tenderness to palpation. No tenderness to palpation of the right anterior or posterior ribs. Full range of motion bilaterally in the upper extremities. EXTREMITIES:  No peripheral edema.   NEURO:  Nonfocal. Well oriented.  Positive affect.     LAB RESULTS: Lab Results  Component Value Date   WBC 4.6 12/22/2012   NEUTROABS 1.9 12/22/2012  HGB 12.0 12/22/2012   HCT 36.5 12/22/2012   MCV 94.2 12/22/2012   PLT 191 12/22/2012      Chemistry      Component Value Date/Time   NA 139 12/22/2012 1019   NA 138 09/11/2012 0625   K 4.0 12/22/2012 1019   K 4.4 09/11/2012 0625   CL 101 09/11/2012 0625   CL 106 04/10/2012 0921   CO2 21* 12/22/2012 1019   CO2 25 09/11/2012 0625   BUN 20.8 12/22/2012 1019   BUN 22 09/11/2012 0625   CREATININE 1.3* 12/22/2012 1019   CREATININE 1.33* 09/11/2012 0625      Component Value Date/Time   CALCIUM 8.7 12/22/2012 1019   CALCIUM 9.0 09/11/2012 0625   ALKPHOS 49 12/22/2012 1019   ALKPHOS 62 08/20/2011 1052   AST 17 12/22/2012 1019   AST 16 08/20/2011 1052   ALT 15 12/22/2012 1019   ALT 15 08/20/2011 1052   BILITOT 0.31 12/22/2012 1019   BILITOT 0.4 08/20/2011 1052        STUDIES: Mm Digital Diagnostic Bilat  12/08/2012   CLINICAL DATA:  History of right breast cancer status post lumpectomy 2012.  EXAM: DIGITAL DIAGNOSTIC  BILATERAL MAMMOGRAM WITH CAD  COMPARISON:  With priors  ACR Breast Density Category c: The breasts are heterogeneously dense, which may obscure small masses.  FINDINGS: Stable lumpectomy changes are seen in the right breast. No suspicious mass or malignant type microcalcifications identified in either breast.  Mammographic images were processed with CAD.  IMPRESSION: No evidence of malignancy in either breast.  RECOMMENDATION: Bilateral diagnostic mammogram in 1 year is recommended.  I have discussed the findings and recommendations with the patient. Results were also provided in writing at the conclusion of the visit. If applicable, a reminder letter will be sent to the patient regarding the next appointment.  BI-RADS CATEGORY  2: Benign Finding(s)   Electronically Signed   By: Lillia Mountain M.D.   On: 12/08/2012 09:42     ASSESSMENT:72 y.o.  [place] woman   (1) status post right lumpectomy and sentinel lymph node biopsy November of 2012 for a pT1c pN0, Stage IA invasive ductal carcinoma,  grade 3, strongly estrogen and progesterone receptor positive, HER-2 not amplified, with an MIB-1 of 96%. Her Oncotype recurrence score predicted a 14% risk of distant recurrence if her only systemic adjuvant therapy is tamoxifen for five years.   (2) status post two cycles of adjuvant docetaxel/cyclophosphamide, completed February 2013, the final two cycles omitted because of poor tolerance  (3) status post radiation completed April 2013  (4)  on anastrozole briefly in May 2013, discontinued secondary to side effects  (5)  on letrozole briefly in early August 2013, discontinued secondary to side effects which included nausea and headaches  (6)  on exemestane between September and November 2013, discontinued doe to arthralgias  (7)  started on tamoxifen beginning in December 2013. The plan is to continue tamoxifen for total of 5 years, until December 2018  PLAN:   fortunately, [name] seems to be tolerating the tamoxifen well, and I'm making no changes in her current regimen. She'll continue to be followed very closely by Dr.  Julien [name], with annual pelvic exams. Certainly, she would let us know immediately if she has any abnormal vaginal bleeding.   We are going to order a breast MRI for further evaluation due to her history of breast cancer, the pain in the right breast, and the fact that she has dense breast tissue bilaterally.  I do feel, however, that it is likely that the pain in the lateral portion of the right breast is going to be postsurgical in origin.   Otherwise, we will continue to see [name] on a q. 6 month basis for the next year. She will see [place] in June, and again next December. Her annual mammograms will continue to be in November of each year. If she is still doing well next November, we will likely extend followup to an annual basis, continuing until she completes 5 years of tamoxifen in December 2018.   [name] voices understanding and agreement with the above plan, and will call  with any changes or problems prior to her next scheduled appointment.   Of note, I have reviewed [name]'s labs with her today. She is aware of her elevated glucose which she will continue to follow closely. (She has known diabetes.) We will also send a copy of these labs to her primary care physician, [name].   Ersie Savino PA-C    12/22/2012

## 2012-12-24 ENCOUNTER — Ambulatory Visit
Admission: RE | Admit: 2012-12-24 | Discharge: 2012-12-24 | Disposition: A | Discharge status: Home / Self Care | Payer: Medicare Other | Source: Ambulatory Visit | Attending: Obstetrics and Gynecology | Admitting: Obstetrics and Gynecology

## 2012-12-24 ENCOUNTER — Other Ambulatory Visit: Payer: Self-pay | Admitting: Obstetrics and Gynecology

## 2012-12-24 DIAGNOSIS — N644 Mastodynia: Secondary | ICD-10-CM

## 2012-12-24 DIAGNOSIS — Z853 Personal history of malignant neoplasm of breast: Secondary | ICD-10-CM

## 2012-12-28 ENCOUNTER — Other Ambulatory Visit: Payer: Self-pay | Admitting: Internal Medicine

## 2012-12-30 ENCOUNTER — Other Ambulatory Visit: Payer: Self-pay | Admitting: *Deleted

## 2012-12-30 ENCOUNTER — Encounter: Payer: Self-pay | Admitting: Internal Medicine

## 2012-12-30 ENCOUNTER — Ambulatory Visit (INDEPENDENT_AMBULATORY_CARE_PROVIDER_SITE_OTHER): Payer: Medicare Other | Admitting: Internal Medicine

## 2012-12-30 VITALS — BP 120/60 | HR 80 | Ht 63.5 in | Wt 151.4 lb

## 2012-12-30 DIAGNOSIS — R634 Abnormal weight loss: Secondary | ICD-10-CM

## 2012-12-30 DIAGNOSIS — R1013 Epigastric pain: Secondary | ICD-10-CM

## 2012-12-30 MED ORDER — MOVIPREP 100 G PO SOLR: 1.0000 | Freq: Once | ORAL | Status: DC

## 2012-12-30 NOTE — Patient Instructions (Addendum)
You have been scheduled for an endoscopy and colonoscopy. Please follow the written instructions given to you at your visit today. Please pick up your prep at the pharmacy within the next 1-3 days. If you use inhalers (even only as needed), please bring them with you on the day of your procedure. Your physician has requested that you go to www.startemmi.com and enter the access code given to you at your visit today. This web site gives a general overview about your procedure. However, you should still follow specific instructions given to you by our office regarding your preparation for the procedure.  CC: Dr Johnsie Cancel, Dr Coletta Memos

## 2012-12-30 NOTE — Progress Notes (Signed)
[name] 11-26-1940 [id number]   History of Present Illness:  This is a 72 year old white female with postprandial abdominal pain which developed last summer and has gotten somewhat better but she still experiences periumbilical abdominal pain either as she is finishing her meal or shortly afterwards. The pain is anterior across the abdomen. It does not radiate to her back and is not associated with bowel movements. It usually subsides within one hour. It never bothers her at night. She has lost 20 pounds since last year. She has learned to eat small meals. Patient has a history of coronary artery disease and a coronary artery stent placement. A CT scan of the abdomen in August 2014 showed fatty liver, post cholecystectomy state and atherosclerotic changes of the mesenteric vessels. Her last colonoscopy in 2005 by Dr. Collene Mares showed a serrated adenoma which was removed. She is due for a repeat colonoscopy. She was treated with adjuvant chemotherapy for breast carcinoma in November 2012. She has a history of gastroesophageal reflux evaluated with an endoscopy in May 2008. There was no evidence of Barrett's esophagus. She takes ibuprofen 200 mg on an as necessary basis, not every day.  Past Medical History  Diagnosis Date  . Elevated cholesterol   . GERD (gastroesophageal reflux disease)   . Thyroid disease   . Hypertension   . Coronary artery disease     stent 2007  . Depression   . Diabetes mellitus   . Hypothyroidism   . 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 Arthritis   . Carcinoma of breast treated with adjuvant chemotherapy   . Heart murmur     secondary to chem. 2013  . Anemia     childhood  . Lung nodule   . Osteoarthritis   . Shingles   . HSV-1 (herpes simplex virus 1) infection   . Hyperlipidemia   . Anxiety   . Internal hemorrhoids 12/10/03  . Diverticulosis 12/10/03  . 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 Cancer      Past Surgical History  Procedure Laterality Date  . Cholecystectomy    . Knee surgery  2007  . Carotid stent  2007  . Carpal tunnel release Bilateral 9/12,8,12  . Dilation and curettage of uterus    . Coronary angioplasty    . Foot spurs    . Breast lumpectomy  1983    benign biopsy  . Breast lumpectomy Right 12/29/2010    snbx, ER/PR +, Her2 -, 0/1 node pos.  . Portacath placement  02/07/2011    Procedure: INSERTION PORT-A-CATH;  Surgeon: [name], MD;  Location: [facility];  Service: General;  Laterality: N/A;  . Port-a-cath removal  04/17/2011    Procedure: REMOVAL PORT-A-CATH;  Surgeon: [name], MD;  Location: [facility];  Service: General;  Laterality: Left;  . Hysteroscopy w/d&c N/A 09/11/2012    Procedure: DILATATION AND CURETTAGE ;  Surgeon: [name], MD;  Location: [facility];  Service: Gynecology;  Laterality: N/A;    Allergies  Allergen Reactions  . Codeine Nausea And Vomiting and Other (See Comments)    Severe stomach cramps  . Antihistamines, Diphenhydramine-Type Other (See Comments)    Causes hyperactivity  . Statins Other (See Comments)    Joint pains  . Erythromycin Hives and Rash    Due to dental work about 50 years ago. Was used in packing and resulted in rash/hives inside and  outside of mouth.    Review of Systems: Denies dysphagia or heartburn  The remainder of the 10 point ROS is negative except as outlined in the H&P  Physical Exam: General Appearance Well developed, in no distress Eyes  Non icteric  HEENT  Non traumatic, normocephalic  Mouth No lesion, tongue papillated, no cheilosis Neck Supple without adenopathy, thyroid not enlarged, no carotid bruits, no JVD Lungs Clear to auscultation bilaterally COR Normal S1, normal S2, regular rhythm, no murmur, quiet precordium Abdomen Soft, nontender with normoactive bowel sounds no bruits. No pulsations. Mild tenderness left lower quadrant. Liver edge at costal  margin Rectal soft Hemoccult negative stool Extremities  No pedal edema Skin No lesions Neurological Alert and oriented x 3 Psychological Normal mood and affect  Assessment and Plan:   Problem #1 postprandial abdominal pain suggestive of intestinal angina. Symptoms also may be related to intestinal obstruction. It seems to be improved on small feedings. Symptomatic diverticulosis is a possibility. Irritable bowel syndrome is less likely based on her symptoms. We will proceed with a colonoscopy because of her personal history of tubular adenoma to rule out colon cancer. We will also do an upper endoscopy at the same time because of the abdominal pain associated with meals. She will continue Protonix. If the colonoscopy is negative, I would consider a CT angiogram to look for mesenteric obstructive disease. She will continue small frequent feedings.    [name] 12/30/2012

## 2013-01-01 ENCOUNTER — Ambulatory Visit (INDEPENDENT_AMBULATORY_CARE_PROVIDER_SITE_OTHER): Payer: Medicare Other | Admitting: Cardiovascular Disease

## 2013-01-01 ENCOUNTER — Ambulatory Visit
Admission: RE | Admit: 2013-01-01 | Discharge: 2013-01-01 | Disposition: A | Discharge status: Home / Self Care | Payer: Medicare Other | Source: Ambulatory Visit | Attending: Physician Assistant | Admitting: Physician Assistant

## 2013-01-01 ENCOUNTER — Encounter: Payer: Self-pay | Admitting: Cardiovascular Disease

## 2013-01-01 VITALS — BP 140/80 | HR 82 | Ht 63.0 in | Wt 152.0 lb

## 2013-01-01 DIAGNOSIS — I2510 Atherosclerotic heart disease of native coronary artery without angina pectoris: Secondary | ICD-10-CM

## 2013-01-01 DIAGNOSIS — Z853 Personal history of malignant neoplasm of breast: Secondary | ICD-10-CM

## 2013-01-01 DIAGNOSIS — E785 Hyperlipidemia, unspecified: Secondary | ICD-10-CM

## 2013-01-01 DIAGNOSIS — I10 Essential (primary) hypertension: Secondary | ICD-10-CM

## 2013-01-01 DIAGNOSIS — R922 Inconclusive mammogram: Secondary | ICD-10-CM

## 2013-01-01 DIAGNOSIS — N644 Mastodynia: Secondary | ICD-10-CM

## 2013-01-01 MED ORDER — NITROGLYCERIN 0.4 MG SL SUBL: 0.4000 mg | SUBLINGUAL_TABLET | SUBLINGUAL | Status: DC | PRN

## 2013-01-01 MED ORDER — GADOBENATE DIMEGLUMINE 529 MG/ML IV SOLN
7.0000 mL | Freq: Once | INTRAVENOUS | Status: AC | PRN
  Administered 2013-01-01: 7 mL via INTRAVENOUS

## 2013-01-01 NOTE — Assessment & Plan Note (Signed)
Cholesterol is at goal.  Continue current dose of statin and diet Rx.  No myalgias or side effects.  F/U  LFT's in 6 months. Lab Results  Component Value Date   LDLCALC 83 05/13/2008

## 2013-01-01 NOTE — Assessment & Plan Note (Signed)
Stable f/u Magrinat  NMR in December ok

## 2013-01-01 NOTE — Progress Notes (Signed)
Patient ID: [name], female   DOB: 1940-12-28, 72 y.o.   MRN: [id number] 72 yo referred by Dr Coletta Memos For CAD, HTN and elevated lipids. Last seen 3 years ago She has been doing well. She has occasional SSCP maybe 1/week. She does not take nitro for it. It subsides spontaneously. It occasionally appears muscular in nature. She has a history of stent to the RCA Most recent cath in 03/2006 showed a patent RCA stent but the patient got sick with spasm of her LAD. Her last myovue was 09/21/08 and was non-ischemic Since I last saw her she has been Rx for breast CA by Dr Jana Hakim. Had right lumpectomy with chemo and XRT ON tamoxifen. Daughter committed suicide and had two small children. Labs followed by Dr Darien Ramus  Having EGD and colon with Dr Olevia Perches in January Needs refill on nitro    ROS: Denies fever, malais, weight loss, blurry vision, decreased visual acuity, cough, sputum, SOB, hemoptysis, pleuritic pain, palpitaitons, heartburn, abdominal pain, melena, lower extremity edema, claudication, or rash.  All other systems reviewed and negative  General: Affect appropriate Healthy:  appears stated age 7: normal Neck supple with no adenopathy JVP normal no bruits no thyromegaly Lungs clear with no wheezing and good diaphragmatic motion Heart:  S1/S2 no murmur, no rub, gallop or click PMI normal Abdomen: benighn, BS positve, no tenderness, no AAA no bruit.  No HSM or HJR Distal pulses intact with no bruits No edema Neuro non-focal Skin warm and dry No muscular weakness   Current Outpatient Prescriptions  Medication Sig Dispense Refill  . aspirin 81 MG tablet Take 81 mg by mouth daily.      . BD PEN NEEDLE NANO U/F 32G X 4 MM MISC       . carvedilol (COREG) 12.5 MG tablet Take 12.5 mg by mouth 2 (two) times daily with a meal.      . cholecalciferol (VITAMIN D) 1000 UNITS tablet Take 1,000 Units by mouth daily.      . Chromium-Cinnamon (CINNAMON PLUS CHROMIUM PO) Take 1 tablet by  mouth 2 (two) times daily.      . colesevelam (WELCHOL) 625 MG tablet Take 1,875 mg by mouth 2 (two) times daily with a meal.      . fish oil-omega-3 fatty acids 1000 MG capsule Take 1,200 mg by mouth 2 (two) times daily.      Marland Kitchen ibuprofen (ADVIL) 200 MG tablet Take 3 tablets (600 mg total) by mouth every 6 (six) hours as needed for pain.  30 tablet  0  . JANUVIA 50 MG tablet Take 50 mg by mouth daily.       Marland Kitchen levothyroxine (SYNTHROID, LEVOTHROID) 50 MCG tablet Take 50 mcg by mouth daily.      Marland Kitchen lisinopril (PRINIVIL,ZESTRIL) 40 MG tablet Take 40 mg by mouth daily.      . metFORMIN (GLUCOPHAGE) 1000 MG tablet 1/2 tab po bid      . MOVIPREP 100 G SOLR Take 1 kit (200 g total) by mouth once.  1 kit  0  . pantoprazole (PROTONIX) 40 MG tablet Take 40 mg by mouth daily.      Marland Kitchen PARoxetine (PAXIL) 20 MG tablet Take 20 mg by mouth every morning.      . tamoxifen (NOLVADEX) 20 MG tablet Take 1 tablet (20 mg total) by mouth daily.  90 tablet  4   No current facility-administered medications for this visit.    Allergies  Codeine; Antihistamines, diphenhydramine-type; Statins;  and Erythromycin  Electrocardiogram:  Assessment and Plan

## 2013-01-01 NOTE — Assessment & Plan Note (Signed)
Well controlled.  Continue current medications and low sodium Dash type diet.    

## 2013-01-01 NOTE — Assessment & Plan Note (Signed)
Stable with no angina and good activity level.  Continue medical Rx New nitro called in  

## 2013-01-01 NOTE — Patient Instructions (Signed)
Your physician wants you to follow-up in: 6 months with Dr. Nishan. You will receive a reminder letter in the mail two months in advance. If you don't receive a letter, please call our office to schedule the follow-up appointment.  Your physician recommends that you continue on your current medications as directed. Please refer to the Current Medication list given to you today.  

## 2013-01-02 ENCOUNTER — Telehealth: Payer: Self-pay | Admitting: *Deleted

## 2013-01-02 NOTE — Telephone Encounter (Signed)
Per PA-C request. Called to inform patient of results of Bilateral Breast MRI and to repeat mammogram next year. Pt verbalized understanding. No further concerns.

## 2013-02-06 NOTE — H&P (Signed)
History of Present Illness:  This is a 73 year old white female with postprandial abdominal pain which developed last summer and has gotten somewhat better but she still experiences periumbilical abdominal pain either as she is finishing her meal or shortly afterwards. The pain is anterior across the abdomen. It does not radiate to her back and is not associated with bowel movements. It usually subsides within one hour. It never bothers her at night. She has lost 20 pounds since last year. She has learned to eat small meals. Patient has a history of coronary artery disease and a coronary artery stent placement. A CT scan of the abdomen in August 2014 showed fatty liver, post cholecystectomy state and atherosclerotic changes of the mesenteric vessels. Her last colonoscopy in 2005 by Dr. Collene Mares showed a serrated adenoma which was removed. She is due for a repeat colonoscopy. She was treated with adjuvant chemotherapy for breast carcinoma in November 2012. She has a history of gastroesophageal reflux evaluated with an endoscopy in May 2008. There was no evidence of Barrett's esophagus. She takes ibuprofen 200 mg on an as necessary basis, not every day.  Past Medical History   Diagnosis  Date   .  Elevated cholesterol    .  GERD (gastroesophageal reflux disease)    .  Thyroid disease    .  Hypertension    .  Coronary artery disease      stent 2007   .  Depression    .  Diabetes mellitus    .  Hypothyroidism    .  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  Arthritis    .  Carcinoma of breast treated with adjuvant chemotherapy    .  Heart murmur      secondary to chem. 2013   .  Anemia      childhood   .  Lung nodule    .  Osteoarthritis    .  Shingles    .  HSV-1 (herpes simplex virus 1) infection    .  Hyperlipidemia    .  Anxiety    .  Internal hemorrhoids  12/10/03   .  Diverticulosis  12/10/03   .  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  Cancer     Past Surgical History   Procedure  Laterality  Date   .  Cholecystectomy     .  Knee surgery   2007   .  Carotid stent   2007   .  Carpal tunnel release  Bilateral  9/12,8,12   .  Dilation and curettage of uterus     .  Coronary angioplasty     .  Foot spurs     .  Breast lumpectomy   1983     benign biopsy   .  Breast lumpectomy  Right  12/29/2010     snbx, ER/PR +, Her2 -, 0/1 node pos.   .  Portacath placement   02/07/2011     Procedure: INSERTION PORT-A-CATH; Surgeon: [name], MD; Location: [facility]; Service: General; Laterality: N/A;   .  Port-a-cath removal   04/17/2011     Procedure: REMOVAL PORT-A-CATH; Surgeon: [name], MD; Location: [facility]; Service: General; Laterality: Left;   .  Hysteroscopy w/d&c  N/A  09/11/2012     Procedure: DILATATION AND CURETTAGE ; Surgeon: [name],  MD; Location: [facility]; Service: Gynecology; Laterality: N/A;    Allergies   Allergen  Reactions   .  Codeine  Nausea And Vomiting and Other (See Comments)     Severe stomach cramps   .  Antihistamines, Diphenhydramine-Type  Other (See Comments)     Causes hyperactivity   .  Statins  Other (See Comments)     Joint pains   .  Erythromycin  Hives and Rash     Due to dental work about 50 years ago. Was used in packing and resulted in rash/hives inside and outside of mouth.   Review of Systems: Denies dysphagia or heartburn  The remainder of the 10 point ROS is negative except as outlined in the H&P  Physical Exam:  General Appearance Well developed, in no distress  Eyes Non icteric  HEENT Non traumatic, normocephalic  Mouth No lesion, tongue papillated, no cheilosis  Neck Supple without adenopathy, thyroid not enlarged, no carotid bruits, no JVD  Lungs Clear to auscultation bilaterally  COR Normal S1, normal S2, regular rhythm, no murmur, quiet precordium  Abdomen Soft, nontender with normoactive bowel sounds no bruits. No pulsations. Mild  tenderness left lower quadrant. Liver edge at costal margin  Rectal soft Hemoccult negative stool  Extremities No pedal edema  Skin No lesions  Neurological Alert and oriented x 3  Psychological Normal mood and affect  Assessment and Plan:  Problem #1 postprandial abdominal pain suggestive of intestinal angina. Symptoms also may be related to intestinal obstruction. It seems to be improved on small feedings. Symptomatic diverticulosis is a possibility. Irritable bowel syndrome is less likely based on her symptoms. We will proceed with a colonoscopy because of her personal history of tubular adenoma to rule out colon cancer. We will also do an upper endoscopy at the same time because of the abdominal pain associated with meals. She will continue Protonix. If the colonoscopy is negative, I would consider a CT angiogram to look for mesenteric obstructive disease. She will continue small frequent feedings.  [name]  12/30/2012

## 2013-02-09 ENCOUNTER — Encounter (HOSPITAL_COMMUNITY): Payer: Self-pay | Admitting: *Deleted

## 2013-02-09 ENCOUNTER — Telehealth: Payer: Self-pay | Admitting: *Deleted

## 2013-02-09 ENCOUNTER — Ambulatory Visit (HOSPITAL_COMMUNITY)
Admission: RE | Admit: 2013-02-09 | Discharge: 2013-02-09 | Disposition: A | Discharge status: Home / Self Care | Payer: Medicare Other | Source: Ambulatory Visit | Attending: Internal Medicine | Admitting: Internal Medicine

## 2013-02-09 ENCOUNTER — Encounter (HOSPITAL_COMMUNITY)
Admission: RE | Disposition: A | Discharge status: Home / Self Care | Payer: Self-pay | Source: Ambulatory Visit | Attending: Internal Medicine

## 2013-02-09 ENCOUNTER — Other Ambulatory Visit: Payer: Self-pay | Admitting: *Deleted

## 2013-02-09 DIAGNOSIS — Z9089 Acquired absence of other organs: Secondary | ICD-10-CM | POA: Insufficient documentation

## 2013-02-09 DIAGNOSIS — I10 Essential (primary) hypertension: Secondary | ICD-10-CM | POA: Insufficient documentation

## 2013-02-09 DIAGNOSIS — E039 Hypothyroidism, unspecified: Secondary | ICD-10-CM | POA: Insufficient documentation

## 2013-02-09 DIAGNOSIS — Z9221 Personal history of antineoplastic chemotherapy: Secondary | ICD-10-CM | POA: Insufficient documentation

## 2013-02-09 DIAGNOSIS — R109 Unspecified abdominal pain: Secondary | ICD-10-CM

## 2013-02-09 DIAGNOSIS — Z860100 Personal history of colon polyps, unspecified: Secondary | ICD-10-CM | POA: Insufficient documentation

## 2013-02-09 DIAGNOSIS — E785 Hyperlipidemia, unspecified: Secondary | ICD-10-CM | POA: Insufficient documentation

## 2013-02-09 DIAGNOSIS — Z853 Personal history of malignant neoplasm of breast: Secondary | ICD-10-CM | POA: Insufficient documentation

## 2013-02-09 DIAGNOSIS — E119 Type 2 diabetes mellitus without complications: Secondary | ICD-10-CM | POA: Insufficient documentation

## 2013-02-09 DIAGNOSIS — R7989 Other specified abnormal findings of blood chemistry: Secondary | ICD-10-CM

## 2013-02-09 DIAGNOSIS — K2940 Chronic atrophic gastritis without bleeding: Secondary | ICD-10-CM | POA: Insufficient documentation

## 2013-02-09 DIAGNOSIS — R1033 Periumbilical pain: Secondary | ICD-10-CM

## 2013-02-09 DIAGNOSIS — I2510 Atherosclerotic heart disease of native coronary artery without angina pectoris: Secondary | ICD-10-CM | POA: Insufficient documentation

## 2013-02-09 DIAGNOSIS — K219 Gastro-esophageal reflux disease without esophagitis: Secondary | ICD-10-CM | POA: Insufficient documentation

## 2013-02-09 DIAGNOSIS — K648 Other hemorrhoids: Secondary | ICD-10-CM | POA: Insufficient documentation

## 2013-02-09 DIAGNOSIS — K5730 Diverticulosis of large intestine without perforation or abscess without bleeding: Secondary | ICD-10-CM | POA: Insufficient documentation

## 2013-02-09 DIAGNOSIS — R634 Abnormal weight loss: Secondary | ICD-10-CM | POA: Insufficient documentation

## 2013-02-09 HISTORY — PX: ESOPHAGOGASTRODUODENOSCOPY: SHX5428

## 2013-02-09 HISTORY — PX: COLONOSCOPY: SHX5424

## 2013-02-09 HISTORY — DX: Pneumonia, unspecified organism: J18.9

## 2013-02-09 LAB — BASIC METABOLIC PANEL
BUN: 21 mg/dL (ref 6–23)
CALCIUM: 8.5 mg/dL (ref 8.4–10.5)
CO2: 19 mEq/L (ref 19–32)
CREATININE: 1.24 mg/dL — AB (ref 0.50–1.10)
Chloride: 108 mEq/L (ref 96–112)
GFR calc Af Amer: 50 mL/min — ABNORMAL LOW (ref >90)
GFR calc non Af Amer: 43 mL/min — ABNORMAL LOW (ref >90)
GLUCOSE: 135 mg/dL — AB (ref 70–99)
Potassium: 4.8 mEq/L (ref 3.7–5.3)
Sodium: 141 mEq/L (ref 137–147)

## 2013-02-09 LAB — GLUCOSE, CAPILLARY: Glucose-Capillary: 151 mg/dL — ABNORMAL HIGH (ref 70–99)

## 2013-02-09 SURGERY — EGD (ESOPHAGOGASTRODUODENOSCOPY)
Anesthesia: Moderate Sedation

## 2013-02-09 MED ORDER — FENTANYL CITRATE 0.05 MG/ML IJ SOLN
INTRAMUSCULAR | Status: DC | PRN
  Administered 2013-02-09 (×3): 25 ug via INTRAVENOUS

## 2013-02-09 MED ORDER — MIDAZOLAM HCL 10 MG/2ML IJ SOLN
INTRAMUSCULAR | Status: AC
  Filled 2013-02-09: qty 2

## 2013-02-09 MED ORDER — FENTANYL CITRATE 0.05 MG/ML IJ SOLN
INTRAMUSCULAR | Status: AC
  Filled 2013-02-09: qty 4

## 2013-02-09 MED ORDER — SODIUM CHLORIDE 0.9 % IV SOLN
INTRAVENOUS | Status: DC
  Administered 2013-02-09: 500 mL via INTRAVENOUS

## 2013-02-09 MED ORDER — MIDAZOLAM HCL 10 MG/2ML IJ SOLN
INTRAMUSCULAR | Status: DC | PRN
  Administered 2013-02-09: 1 mg via INTRAVENOUS
  Administered 2013-02-09 (×2): 2 mg via INTRAVENOUS

## 2013-02-09 MED ORDER — BUTAMBEN-TETRACAINE-BENZOCAINE 2-2-14 % EX AERO
INHALATION_SPRAY | CUTANEOUS | Status: DC | PRN
  Administered 2013-02-09: 2 via TOPICAL

## 2013-02-09 NOTE — Op Note (Signed)
[facility] [place], [address]   COLONOSCOPY PROCEDURE REPORT  PATIENT: [name], [name]  MR#: [id number] BIRTHDATE: 22-May-1940 , 37  yrs. old GENDER: Female ENDOSCOPIST: [name], MD REFERRED [id number] Bouska, M.D. , Dr Johnsie Cancel PROCEDURE DATE:  02/09/2013 PROCEDURE:   Colonoscopy, screening First Screening Colonoscopy - Avg.  risk and is 50 yrs.  old or older - No.  Prior Negative Screening - Now for repeat screening. N/A  History of Adenoma - Now for follow-up colonoscopy & has been > or = to 3 yrs.  Yes hx of adenoma.  Has been 3 or more years since last colonoscopy.  Polyps Removed Today? No.  Recommend repeat exam, <10 yrs? No. ASA CLASS:   Class II INDICATIONS:screening average risk, last colonoscopy 2005 showed serrated adenoma, abd. pain, weight loss MEDICATIONS: These medications were titrated to patient response per physician's verbal order, Fentanyl 25 mcg IV, and Versed 1mg  IV  DESCRIPTION OF PROCEDURE:   After the risks benefits and alternatives of the procedure were thoroughly explained, informed consent was obtained.  A digital rectal exam revealed no abnormalities of the rectum.   The     endoscope was introduced through the anus and advanced to the cecum, which was identified by both the appendix and ileocecal valve. No adverse events experienced.   The quality of the prep was excellent, using MoviPrep  The instrument was then slowly withdrawn as the colon was fully examined.      COLON FINDINGS: There was mild diverticulosis noted in the sigmoid colon with associated muscular hypertrophy.   Small internal hemorrhoids were found.  Retroflexed views revealed no abnormalities. The time to cecum=5 minutes 0 seconds.  Withdrawal time=6 minutes 0 seconds.  The scope was withdrawn and the procedure completed. COMPLICATIONS: There were no complications.  ENDOSCOPIC IMPRESSION: 1.   There was mild diverticulosis noted in the  sigmoid colon 2.   Small internal hemorrhoids ,nothing to account for abdominal pain and weight loss  RECOMMENDATIONS: we need to rule out intestinal angina.  She will be scheduled for CT angiogram.  We will check her renal function today, High fiber diet Small feeding recall colonoscopy in 10 years   eSigned:  [name], MD 02/09/2013 9:49 AM   cc:   PATIENT NAME:  [name], [name] MR#: [id number]

## 2013-02-09 NOTE — Progress Notes (Signed)
BMP drawn at 1012 from right great saphenous vein no difficulty pressure dressing applied instructed to remove in 20 min B.Kace Hartje R.N.

## 2013-02-09 NOTE — Interval H&P Note (Signed)
History and Physical Interval Note:  02/09/2013 8:47 AM  [name]  has presented today for surgery, with the diagnosis of epigastric abdominal pain, weight loss  The various methods of treatment have been discussed with the patient and family. After consideration of risks, benefits and other options for treatment, the patient has consented to  Procedure(s): ESOPHAGOGASTRODUODENOSCOPY (EGD) (N/A) COLONOSCOPY (N/A) as a surgical intervention .  The patient's history has been reviewed, patient examined, no change in status, stable for surgery.  I have reviewed the patient's chart and labs.  Questions were answered to the patient's satisfaction.     [name]

## 2013-02-09 NOTE — Telephone Encounter (Signed)
Received a call from Dr. Olevia Perches, patient needs CT angio- intestinal angina. Spoke with Rose and scheduled on 02/13/13 at 9:00 am. NPO 4 hours prior at [place] at [facility] endo will give patient appointment and instructions.

## 2013-02-09 NOTE — Discharge Instructions (Signed)
CT angiogram scheduled, see instructions

## 2013-02-09 NOTE — Op Note (Signed)
[facility] [place], [address]   ENDOSCOPY PROCEDURE REPORT  PATIENT: [name], [name]  MR#: [id number] BIRTHDATE: [birth year] , 52  yrs. old GENDER: Female ENDOSCOPIST: [name], MD REFERRED BY:  Bernerd Limbo, M.D. , Dr Johnsie Cancel PROCEDURE DATE:  02/09/2013 PROCEDURE:  EGD w/ biopsy ASA CLASS:     Class II INDICATIONS:  periumbilical abdominal pain.  ,weight-loss, postprandial pain MEDICATIONS: These medications were titrated to patient response per physician's verbal order, Fentanyl-Detailed 50 ug IV, and Versed 4 mg IV TOPICAL ANESTHETIC: Cetacaine Spray  DESCRIPTION OF PROCEDURE: After the risks benefits and alternatives of the procedure were thoroughly explained, informed consent was obtained.  The endoscope [id number] endoscope was introduced through the mouth and advanced to the second portion of the duodenum. Without limitations.  The instrument was slowly withdrawn as the mucosa was fully examined.      Esophagus: Proximal mid and distal esophageal mucosa was normal. There was no stricture esophagitis or hiatal hernia Stomach: There was a moderate amount of bile collected along the greater curvature of the stomach. Gastric antrum was normal. Pyloric outlet was normal. Retroflexion of the endoscope revealed normal fundus and cardia Duodenum duodenal bulb and descending duodenum was normal Biopsies were taken from the gastric antrum to rule out H. pylori[         The scope was then withdrawn from the patient and the procedure completed.  COMPLICATIONS: There were no complications. ENDOSCOPIC IMPRESSION:  normal esophagus stomach and duodenum status post biopsies from the antrum,, nothing to account for abdominal pain RECOMMENDATIONS:  proceed with colonoscopy REPEAT EXAM: no  eSigned:  [name], MD 02/09/2013 9:43 AM   CC:

## 2013-02-10 ENCOUNTER — Encounter (HOSPITAL_COMMUNITY): Payer: Self-pay | Admitting: Internal Medicine

## 2013-02-13 ENCOUNTER — Ambulatory Visit (INDEPENDENT_AMBULATORY_CARE_PROVIDER_SITE_OTHER)
Admit: 2013-02-13 | Discharge: 2013-02-13 | Disposition: A | Discharge status: Home / Self Care | Payer: Medicare Other | Attending: Internal Medicine | Admitting: Internal Medicine

## 2013-02-13 ENCOUNTER — Other Ambulatory Visit: Payer: Self-pay | Admitting: *Deleted

## 2013-02-13 DIAGNOSIS — R109 Unspecified abdominal pain: Secondary | ICD-10-CM

## 2013-02-13 DIAGNOSIS — Z86010 Personal history of colonic polyps: Secondary | ICD-10-CM

## 2013-02-13 MED ORDER — HYOSCYAMINE SULFATE 0.125 MG SL SUBL: SUBLINGUAL_TABLET | SUBLINGUAL | Status: DC

## 2013-02-13 MED ORDER — IOHEXOL 350 MG/ML SOLN
80.0000 mL | Freq: Once | INTRAVENOUS | Status: AC | PRN
  Administered 2013-02-13: 80 mL via INTRAVENOUS

## 2013-02-17 ENCOUNTER — Encounter: Payer: Self-pay | Admitting: *Deleted

## 2013-03-04 ENCOUNTER — Emergency Department (HOSPITAL_COMMUNITY)
Admission: EM | Admit: 2013-03-04 | Discharge: 2013-03-04 | Disposition: A | Discharge status: Home / Self Care | Payer: Medicare Other | Attending: Emergency Medicine | Admitting: Emergency Medicine

## 2013-03-04 ENCOUNTER — Ambulatory Visit: Payer: Medicare Other | Admitting: Cardiovascular Disease

## 2013-03-04 ENCOUNTER — Encounter (HOSPITAL_COMMUNITY): Payer: Self-pay | Admitting: Emergency Medicine

## 2013-03-04 DIAGNOSIS — Z853 Personal history of malignant neoplasm of breast: Secondary | ICD-10-CM | POA: Insufficient documentation

## 2013-03-04 DIAGNOSIS — F329 Major depressive disorder, single episode, unspecified: Secondary | ICD-10-CM | POA: Insufficient documentation

## 2013-03-04 DIAGNOSIS — K219 Gastro-esophageal reflux disease without esophagitis: Secondary | ICD-10-CM | POA: Insufficient documentation

## 2013-03-04 DIAGNOSIS — R112 Nausea with vomiting, unspecified: Secondary | ICD-10-CM

## 2013-03-04 DIAGNOSIS — E039 Hypothyroidism, unspecified: Secondary | ICD-10-CM | POA: Insufficient documentation

## 2013-03-04 DIAGNOSIS — M1990 Unspecified osteoarthritis, unspecified site: Secondary | ICD-10-CM | POA: Insufficient documentation

## 2013-03-04 DIAGNOSIS — Z862 Personal history of diseases of the blood and blood-forming organs and certain disorders involving the immune mechanism: Secondary | ICD-10-CM | POA: Insufficient documentation

## 2013-03-04 DIAGNOSIS — R197 Diarrhea, unspecified: Secondary | ICD-10-CM | POA: Insufficient documentation

## 2013-03-04 DIAGNOSIS — I2510 Atherosclerotic heart disease of native coronary artery without angina pectoris: Secondary | ICD-10-CM | POA: Insufficient documentation

## 2013-03-04 DIAGNOSIS — Z79899 Other long term (current) drug therapy: Secondary | ICD-10-CM | POA: Insufficient documentation

## 2013-03-04 DIAGNOSIS — Z860100 Personal history of colon polyps, unspecified: Secondary | ICD-10-CM | POA: Insufficient documentation

## 2013-03-04 DIAGNOSIS — I10 Essential (primary) hypertension: Secondary | ICD-10-CM | POA: Insufficient documentation

## 2013-03-04 DIAGNOSIS — R404 Transient alteration of awareness: Secondary | ICD-10-CM | POA: Insufficient documentation

## 2013-03-04 DIAGNOSIS — F411 Generalized anxiety disorder: Secondary | ICD-10-CM | POA: Insufficient documentation

## 2013-03-04 DIAGNOSIS — F3289 Other specified depressive episodes: Secondary | ICD-10-CM | POA: Insufficient documentation

## 2013-03-04 DIAGNOSIS — Z87891 Personal history of nicotine dependence: Secondary | ICD-10-CM | POA: Insufficient documentation

## 2013-03-04 DIAGNOSIS — Z9861 Coronary angioplasty status: Secondary | ICD-10-CM | POA: Insufficient documentation

## 2013-03-04 DIAGNOSIS — R011 Cardiac murmur, unspecified: Secondary | ICD-10-CM | POA: Insufficient documentation

## 2013-03-04 DIAGNOSIS — Z7982 Long term (current) use of aspirin: Secondary | ICD-10-CM | POA: Insufficient documentation

## 2013-03-04 DIAGNOSIS — Z8701 Personal history of pneumonia (recurrent): Secondary | ICD-10-CM | POA: Insufficient documentation

## 2013-03-04 DIAGNOSIS — R55 Syncope and collapse: Secondary | ICD-10-CM | POA: Insufficient documentation

## 2013-03-04 DIAGNOSIS — E119 Type 2 diabetes mellitus without complications: Secondary | ICD-10-CM | POA: Insufficient documentation

## 2013-03-04 DIAGNOSIS — Z8619 Personal history of other infectious and parasitic diseases: Secondary | ICD-10-CM | POA: Insufficient documentation

## 2013-03-04 LAB — COMPREHENSIVE METABOLIC PANEL
ALT: 17 U/L (ref 0–35)
AST: 21 U/L (ref 0–37)
Albumin: 3 g/dL — ABNORMAL LOW (ref 3.5–5.2)
Alkaline Phosphatase: 44 U/L (ref 39–117)
BUN: 15 mg/dL (ref 6–23)
CALCIUM: 7.6 mg/dL — AB (ref 8.4–10.5)
CO2: 21 meq/L (ref 19–32)
Chloride: 97 mEq/L (ref 96–112)
Creatinine, Ser: 1.04 mg/dL (ref 0.50–1.10)
GFR, EST AFRICAN AMERICAN: 62 mL/min — AB (ref >90)
GFR, EST NON AFRICAN AMERICAN: 53 mL/min — AB (ref >90)
GLUCOSE: 167 mg/dL — AB (ref 70–99)
Potassium: 3.8 mEq/L (ref 3.7–5.3)
Sodium: 133 mEq/L — ABNORMAL LOW (ref 137–147)
Total Bilirubin: 0.4 mg/dL (ref 0.3–1.2)
Total Protein: 6 g/dL (ref 6.0–8.3)

## 2013-03-04 LAB — URINALYSIS, ROUTINE W REFLEX MICROSCOPIC
Bilirubin Urine: NEGATIVE
Glucose, UA: NEGATIVE mg/dL
Hgb urine dipstick: NEGATIVE
Ketones, ur: NEGATIVE mg/dL
Leukocytes, UA: NEGATIVE
NITRITE: NEGATIVE
Protein, ur: NEGATIVE mg/dL
SPECIFIC GRAVITY, URINE: 1.009 (ref 1.005–1.030)
UROBILINOGEN UA: 0.2 mg/dL (ref 0.0–1.0)
pH: 5.5 (ref 5.0–8.0)

## 2013-03-04 LAB — CBC WITH DIFFERENTIAL/PLATELET
Basophils Absolute: 0 10*3/uL (ref 0.0–0.1)
Basophils Relative: 0 % (ref 0–1)
EOS ABS: 0 10*3/uL (ref 0.0–0.7)
EOS PCT: 0 % (ref 0–5)
HEMATOCRIT: 34.6 % — AB (ref 36.0–46.0)
Hemoglobin: 11.9 g/dL — ABNORMAL LOW (ref 12.0–15.0)
LYMPHS PCT: 19 % (ref 12–46)
Lymphs Abs: 0.9 10*3/uL (ref 0.7–4.0)
MCH: 30.6 pg (ref 26.0–34.0)
MCHC: 34.4 g/dL (ref 30.0–36.0)
MCV: 88.9 fL (ref 78.0–100.0)
MONO ABS: 0.5 10*3/uL (ref 0.1–1.0)
Monocytes Relative: 10 % (ref 3–12)
Neutro Abs: 3.5 10*3/uL (ref 1.7–7.7)
Neutrophils Relative %: 71 % (ref 43–77)
Platelets: 146 10*3/uL — ABNORMAL LOW (ref 150–400)
RBC: 3.89 MIL/uL (ref 3.87–5.11)
RDW: 12.9 % (ref 11.5–15.5)
WBC: 4.9 10*3/uL (ref 4.0–10.5)

## 2013-03-04 MED ORDER — ONDANSETRON HCL 4 MG PO TABS: 4.0000 mg | ORAL_TABLET | Freq: Four times a day (QID) | ORAL | Status: DC

## 2013-03-04 MED ORDER — SODIUM CHLORIDE 0.9 % IV BOLUS (SEPSIS)
1000.0000 mL | Freq: Once | INTRAVENOUS | Status: AC
  Administered 2013-03-04: 1000 mL via INTRAVENOUS

## 2013-03-04 MED ORDER — ONDANSETRON HCL 4 MG/2ML IJ SOLN
4.0000 mg | Freq: Once | INTRAMUSCULAR | Status: AC
  Administered 2013-03-04: 4 mg via INTRAVENOUS
  Filled 2013-03-04: qty 2

## 2013-03-04 MED ORDER — SODIUM CHLORIDE 0.9 % IV SOLN
1000.0000 mL | Freq: Once | INTRAVENOUS | Status: AC
Start: 2013-03-04 — End: 2013-03-04
  Administered 2013-03-04: 1000 mL via INTRAVENOUS

## 2013-03-04 NOTE — ED Notes (Signed)
Pt placed on bed pan unable to void

## 2013-03-04 NOTE — ED Notes (Signed)
Pt taken off bedpan, pt sts "i can't go on this".

## 2013-03-04 NOTE — Discharge Instructions (Signed)
Syncope Syncope is a fainting spell. This means the person loses consciousness and drops to the ground. The person is generally unconscious for less than 5 minutes. The person may have some muscle twitches for up to 15 seconds before waking up and returning to normal. Syncope occurs more often in elderly people, but it can happen to anyone. While most causes of syncope are not dangerous, syncope can be a sign of a serious medical problem. It is important to seek medical care.  CAUSES  Syncope is caused by a sudden decrease in blood flow to the brain. The specific cause is often not determined. Factors that can trigger syncope include:  Taking medicines that lower blood pressure.  Sudden changes in posture, such as standing up suddenly.  Taking more medicine than prescribed.  Standing in one place for too long.  Seizure disorders.  Dehydration and excessive exposure to heat.  Low blood sugar (hypoglycemia).  Straining to have a bowel movement.  Heart disease, irregular heartbeat, or other circulatory problems.  Fear, emotional distress, seeing blood, or severe pain. SYMPTOMS  Right before fainting, you may:  Feel dizzy or lightheaded.  Feel nauseous.  See all white or all black in your field of vision.  Have cold, clammy skin. DIAGNOSIS  Your caregiver will ask about your symptoms, perform a physical exam, and perform electrocardiography (ECG) to record the electrical activity of your heart. Your caregiver may also perform other heart or blood tests to determine the cause of your syncope. TREATMENT  In most cases, no treatment is needed. Depending on the cause of your syncope, your caregiver may recommend changing or stopping some of your medicines. HOME CARE INSTRUCTIONS  Have someone stay with you until you feel stable.  Do not drive, operate machinery, or play sports until your caregiver says it is okay.  Keep all follow-up appointments as directed by your  caregiver.  Lie down right away if you start feeling like you might faint. Breathe deeply and steadily. Wait until all the symptoms have passed.  Drink enough fluids to keep your urine clear or pale yellow.  If you are taking blood pressure or heart medicine, get up slowly, taking several minutes to sit and then stand. This can reduce dizziness. SEEK IMMEDIATE MEDICAL CARE IF:   You have a severe headache.  You have unusual pain in the chest, abdomen, or back.  You are bleeding from the mouth or rectum, or you have black or tarry stool.  You have an irregular or very fast heartbeat.  You have pain with breathing.  You have repeated fainting or seizure-like jerking during an episode.  You faint when sitting or lying down.  You have confusion.  You have difficulty walking.  You have severe weakness.  You have vision problems. If you fainted, call your local emergency services (911 in [place] Do not drive yourself to the hospital.  MAKE SURE YOU:  Understand these instructions.  Will watch your condition.  Will get help right away if you are not doing well or get worse. Document Released: 01/08/2005 Document Revised: 07/10/2011 Document Reviewed: 03/09/2011 [facility] Patient Information 2014 [place].  Nausea and Vomiting Nausea means you feel sick to your stomach. Throwing up (vomiting) is a reflex where stomach contents come out of your mouth. HOME CARE   Take medicine as told by your doctor.  Do not force yourself to eat. However, you do need to drink fluids.  If you feel like eating, eat a normal diet  as told by your doctor.  Eat rice, wheat, potatoes, bread, lean meats, yogurt, fruits, and vegetables.  Avoid high-fat foods.  Drink enough fluids to keep your pee (urine) clear or pale yellow.  Ask your doctor how to replace body fluid losses (rehydrate). Signs of body fluid loss (dehydration) include:  Feeling very thirsty.  Dry lips and  mouth.  Feeling dizzy.  Dark pee.  Peeing less than normal.  Feeling confused.  Fast breathing or heart rate. GET HELP RIGHT AWAY IF:   You have blood in your throw up.  You have black or bloody poop (stool).  You have a bad headache or stiff neck.  You feel confused.  You have bad belly (abdominal) pain.  You have chest pain or trouble breathing.  You do not pee at least once every 8 hours.  You have cold, clammy skin.  You keep throwing up after 24 to 48 hours.  You have a fever. MAKE SURE YOU:   Understand these instructions.  Will watch your condition.  Will get help right away if you are not doing well or get worse. Document Released: 06/27/2007 Document Revised: 04/02/2011 Document Reviewed: 06/09/2010 [facility] Patient Information 2014 [place], [place].

## 2013-03-04 NOTE — ED Notes (Signed)
Per EMS: Pt c/o NVD since yesterday w/ 1 episode of syncope this morning.

## 2013-03-04 NOTE — ED Notes (Signed)
Pt refusing to come off bedpan

## 2013-03-04 NOTE — ED Provider Notes (Signed)
CSN: [id number]     Arrival date & time 03/04/13  1015 History   First MD Initiated Contact with Patient 03/04/13 1018     Chief Complaint  Patient presents with  . Nausea  . Emesis  . Diarrhea  . Loss of Consciousness     (Consider location/radiation/quality/duration/timing/severity/associated sxs/prior Treatment) HPI  73 year old female with nausea, vomiting diarrhea. Onset yesterday. Multiple episodes of each. No blood, coffee-ground emesis or melena. Denies any pain anywhere. Feels generally weak. No focalized deficits. Patient had syncopal event just prior to arrival which is what prompted her to come to the emergency room. She's not sure why she passed out. Denies any preceding symptoms. Currently denies any chest pain, shortness of breath, dizziness or lightheadedness. No acute visual changes. No acute numbness, tingling or loss of strength. No history of recurrent syncope. No sick contacts.  Past Medical History  Diagnosis Date  . Elevated cholesterol   . GERD (gastroesophageal reflux disease)   . Thyroid disease   . Hypertension   . Coronary artery disease     stent 2007  . Depression   . Diabetes mellitus   . Hypothyroidism   . 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 Arthritis   . Carcinoma of breast treated with adjuvant chemotherapy   . Heart murmur     secondary to chem. 2013  . Anemia     childhood  . Lung nodule   . Osteoarthritis   . Shingles   . HSV-1 (herpes simplex virus 1) infection   . Hyperlipidemia   . Anxiety   . Internal hemorrhoids 12/10/03  . Diverticulosis 12/10/03  . 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 Cancer   . Pneumonia     2014 september  . Need for shingles vaccine     patient has taken shingles vaccine in 2008  . Arterial stenosis     mesenteric   Past Surgical History  Procedure Laterality Date  . Cholecystectomy    . Knee surgery  2007  . Carotid stent  2007  . Carpal  tunnel release Bilateral 9/12,8,12  . Dilation and curettage of uterus    . Coronary angioplasty    . Foot spurs    . Breast lumpectomy  1983    benign biopsy  . Breast lumpectomy Right 12/29/2010    snbx, ER/PR +, Her2 -, 0/1 node pos.  . Portacath placement  02/07/2011    Procedure: INSERTION PORT-A-CATH;  Surgeon: [name], MD;  Location: [facility];  Service: General;  Laterality: N/A;  . Port-a-cath removal  04/17/2011    Procedure: REMOVAL PORT-A-CATH;  Surgeon: [name], MD;  Location: [facility];  Service: General;  Laterality: Left;  . Hysteroscopy w/d&c N/A 09/11/2012    Procedure: DILATATION AND CURETTAGE ;  Surgeon: [name], MD;  Location: [facility];  Service: Gynecology;  Laterality: N/A;  . Esophagogastroduodenoscopy N/A 02/09/2013    Procedure: ESOPHAGOGASTRODUODENOSCOPY (EGD);  Surgeon: [name], MD;  Location: [name];  Service: Endoscopy;  Laterality: N/A;  . Colonoscopy N/A 02/09/2013    Procedure: COLONOSCOPY;  Surgeon: [name], MD;  Location: [facility];  Service: Endoscopy;  Laterality: N/A;   Family History  Problem Relation Age of Onset  . Cancer Son   . Throat cancer Father   . Lymphoma Brother 64  . Colon cancer Neg Hx   . Heart disease Mother   .  Alcoholism Father   . Heart attack Father   . Heart attack Brother   . Diabetes Brother    History  Substance Use Topics  . Smoking status: Former Smoker -- 2.00 packs/day for 40 years    Quit date: 12/19/1997  . Smokeless tobacco: Never Used  . Alcohol Use: 3.6 oz/week    6 Glasses of wine per week     Comment: occasional   OB History   Grav Para Term Preterm Abortions TAB SAB Ect Mult Living   3 3             Obstetric Comments   MENARCHE AGE 25, MENOPAUSE MID 40'S, HRT X 10 YRS, D/C 2002     Review of Systems  All systems reviewed and negative, other than as noted in HPI.   Allergies  Codeine; Antihistamines, diphenhydramine-type; Statins; and  Erythromycin  Home Medications   Current Outpatient Rx  Name  Route  Sig  Dispense  Refill  . aspirin 81 MG tablet   Oral   Take 81 mg by mouth daily.         . carvedilol (COREG) 12.5 MG tablet   Oral   Take 6.25 mg by mouth 2 (two) times daily with a meal.          . colesevelam (WELCHOL) 625 MG tablet   Oral   Take 1,875 mg by mouth 2 (two) times daily with a meal.         . fish oil-omega-3 fatty acids 1000 MG capsule   Oral   Take 1,200 mg by mouth 2 (two) times daily.         Marland Kitchen ibuprofen (ADVIL) 200 MG tablet   Oral   Take 3 tablets (600 mg total) by mouth every 6 (six) hours as needed for pain.   30 tablet   0   . JANUVIA 50 MG tablet   Oral   Take 50 mg by mouth daily.          Marland Kitchen levothyroxine (SYNTHROID, LEVOTHROID) 50 MCG tablet   Oral   Take 50 mcg by mouth daily.         Marland Kitchen lisinopril (PRINIVIL,ZESTRIL) 40 MG tablet   Oral   Take 20 mg by mouth daily.          . metFORMIN (GLUCOPHAGE) 1000 MG tablet   Oral   Take 500 mg by mouth 2 (two) times daily with a meal.          . nitroGLYCERIN (NITROSTAT) 0.4 MG SL tablet   Sublingual   Place 1 tablet (0.4 mg total) under the tongue every 5 (five) minutes as needed for chest pain.   25 tablet   3   . pantoprazole (PROTONIX) 40 MG tablet   Oral   Take 40 mg by mouth daily.         Marland Kitchen PARoxetine (PAXIL) 20 MG tablet   Oral   Take 20 mg by mouth every morning.         . tamoxifen (NOLVADEX) 20 MG tablet   Oral   Take 1 tablet (20 mg total) by mouth daily.   90 tablet   4    BP 148/67  Pulse 77  Temp(Src) 97.9 F (36.6 C) (Oral)  Resp 25  SpO2 99% Physical Exam  Nursing note and vitals reviewed. Constitutional: She appears well-developed and well-nourished. No distress.  HENT:  Head: Normocephalic and atraumatic.  Eyes: Conjunctivae are normal. Right eye exhibits  no discharge. Left eye exhibits no discharge.  Neck: Neck supple.  Cardiovascular: Normal rate, regular rhythm  and normal heart sounds.  Exam reveals no gallop and no friction rub.   No murmur heard. Pulmonary/Chest: Effort normal and breath sounds normal. No respiratory distress.  Abdominal: Soft. She exhibits no distension. There is no tenderness.  Genitourinary:  Lower extremities symmetric as compared to each other. No calf tenderness. Negative Homan's. No palpable cords.   Musculoskeletal: She exhibits no edema and no tenderness.  Neurological: She is alert.  Skin: Skin is warm and dry.  Psychiatric: She has a normal mood and affect. Her behavior is normal. Thought content normal.    ED Course  Procedures (including critical care time) Labs Review Labs Reviewed  CBC WITH DIFFERENTIAL - Abnormal; Notable for the following:    Hemoglobin 11.9 (*)    HCT 34.6 (*)    Platelets 146 (*)    All other components within normal limits  COMPREHENSIVE METABOLIC PANEL - Abnormal; Notable for the following:    Sodium 133 (*)    Glucose, Bld 167 (*)    Calcium 7.6 (*)    Albumin 3.0 (*)    GFR calc non Af Amer 53 (*)    GFR calc Af Amer 62 (*)    All other components within normal limits  URINALYSIS, ROUTINE W REFLEX MICROSCOPIC   Imaging Review No results found.  EKG Interpretation    Date/Time:  Wednesday March 04 2013 10:29:15 EST Ventricular Rate:  86 PR Interval:  150 QRS Duration: 73 QT Interval:  394 QTC Calculation: 471 R Axis:   68 Text Interpretation:  Sinus rhythm Minimal ST elevation, inferior leads ED PHYSICIAN INTERPRETATION AVAILABLE IN [facility] Confirmed by TEST, RECORD ([id number]) on 03/06/2013 8:57:49 AM            MDM   Final diagnoses:  Nausea vomiting and diarrhea  Syncope   73 year old female with nausea and vomiting. Syncopal episode. Nonfocal neurologic examination. Hemodynamically stable. Nausea improved prior to discharge. Benign abdominal exam. Ambulated prior to discharge without difficulty. Question vasovagal episode? Workup has been fairly  unremarkable.    Virgel Manifold, MD 03/09/13 1438

## 2013-03-04 NOTE — ED Notes (Signed)
Initial Contact - pt resting on stretcher with family at bs.  Pt c/o nausea at this time.  Pt med per [facility].  Pt attempting to void on bedpan at this time.  Pt denies other needs/complaints.  Skin PWD.  MAEI.  Neuros grossly intact.  Pt denies CP/SOB, dizziness, weakness.  VSS.  NAD.

## 2013-03-13 ENCOUNTER — Encounter: Payer: Self-pay | Admitting: Internal Medicine

## 2013-03-13 ENCOUNTER — Encounter: Payer: Self-pay | Admitting: Cardiovascular Disease

## 2013-03-13 ENCOUNTER — Ambulatory Visit (INDEPENDENT_AMBULATORY_CARE_PROVIDER_SITE_OTHER): Payer: Medicare Other | Admitting: Internal Medicine

## 2013-03-13 VITALS — BP 124/60 | HR 82 | Ht 63.0 in | Wt 147.8 lb

## 2013-03-13 DIAGNOSIS — R112 Nausea with vomiting, unspecified: Secondary | ICD-10-CM

## 2013-03-13 DIAGNOSIS — R634 Abnormal weight loss: Secondary | ICD-10-CM

## 2013-03-13 NOTE — Patient Instructions (Addendum)
You have been scheduled for a gastric emptying scan at [facility] on March 27, 2013 at 9:30 am. Please arrive at least 15 minutes prior to your appointment for registration. Please make certain not to have anything to eat or drink after midnight the night before your test. Hold all stomach medications (ex: Zofran, phenergan, Reglan) 48 hours prior to your test. If you need to reschedule your appointment, please contact radiology scheduling at [phone]. _____________________________________________________________________ A gastric-emptying study measures how long it takes for food to move through your stomach. There are several ways to measure stomach emptying. In the most common test, you eat food that contains a small amount of radioactive material. A scanner that detects the movement of the radioactive material is placed over your abdomen to monitor the rate at which food leaves your stomach. This test normally takes about 2 hours to complete. _____________________________________________________________________ Dr Bernerd Limbo, Dr Johnsie Cancel

## 2013-03-13 NOTE — Progress Notes (Signed)
[name] 1941-01-05 [id number]  Note: This dictation was prepared with Dragon digital system. Any transcriptional errors that result from this procedure are unintentional.   History of Present Illness: This is a  73 year old white female with continued abdominal pain bloating and nausea occurring postprandially. She has had 2 discrete episodes of vomiting and diarrhea at night one of them was evaluated in the emergency room and was attributed to dehydration. She is a diabetic. She has had extensive evaluation with upper endoscopy, colonoscopy in January 2015 and CT angiogram which showed no significant mesenteric stenosis except for upper right renal artery narrowing. She has had gradual weight loss from 163 pounds in the November 2013 255 pounds in March 2014, 151 pounds in December 2014 and today 147 pounds. She has vomited some undigested food. She has been on Paxil 20 mg a day after her 52 year old daughter commented suicide 4 years ago.    Past Medical History  Diagnosis Date  . Elevated cholesterol   . GERD (gastroesophageal reflux disease)   . Thyroid disease   . Hypertension   . Coronary artery disease     stent 2007  . Depression   . Diabetes mellitus   . Hypothyroidism   . 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 Arthritis   . Carcinoma of breast treated with adjuvant chemotherapy   . Heart murmur     secondary to chem. 2013  . Anemia     childhood  . Lung nodule   . Osteoarthritis   . Shingles   . HSV-1 (herpes simplex virus 1) infection   . Hyperlipidemia   . Anxiety   . Internal hemorrhoids 12/10/03  . Diverticulosis 12/10/03  . 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 Cancer   . Pneumonia     2014 september  . Need for shingles vaccine     patient has taken shingles vaccine in 2008  . Arterial stenosis     mesenteric    Past Surgical History  Procedure Laterality Date  . Cholecystectomy    . Knee  surgery  2007  . Carotid stent  2007  . Carpal tunnel release Bilateral 9/12,8,12  . Dilation and curettage of uterus    . Coronary angioplasty    . Foot spurs    . Breast lumpectomy  1983    benign biopsy  . Breast lumpectomy Right 12/29/2010    snbx, ER/PR +, Her2 -, 0/1 node pos.  . Portacath placement  02/07/2011    Procedure: INSERTION PORT-A-CATH;  Surgeon: [name], MD;  Location: [facility];  Service: General;  Laterality: N/A;  . Port-a-cath removal  04/17/2011    Procedure: REMOVAL PORT-A-CATH;  Surgeon: [name], MD;  Location: [facility];  Service: General;  Laterality: Left;  . Hysteroscopy w/d&c N/A 09/11/2012    Procedure: DILATATION AND CURETTAGE ;  Surgeon: [name], MD;  Location: [facility];  Service: Gynecology;  Laterality: N/A;  . Esophagogastroduodenoscopy N/A 02/09/2013    Procedure: ESOPHAGOGASTRODUODENOSCOPY (EGD);  Surgeon: [name], MD;  Location: [name];  Service: Endoscopy;  Laterality: N/A;  . Colonoscopy N/A 02/09/2013    Procedure: COLONOSCOPY;  Surgeon: [name], MD;  Location: [facility];  Service: Endoscopy;  Laterality: N/A;    Allergies  Allergen Reactions  . Codeine Nausea And Vomiting and Other (See Comments)    Severe stomach cramps  . Antihistamines, Diphenhydramine-Type  Other (See Comments)    Causes hyperactivity  . Statins Other (See Comments)    Joint pains  . Erythromycin Hives and Rash    Due to dental work about 50 years ago. Was used in packing and resulted in rash/hives inside and outside of mouth.    Family history and social history have been reviewed.  Review of Systems: Denies dysphagia. Positive for weight loss and dyspepsia  The remainder of the 10 point ROS is negative except as outlined in the H&P  Physical Exam: General Appearance Well developed, in no distress Eyes  Non icteric  HEENT  Non traumatic, normocephalic  Mouth No lesion, tongue papillated, no cheilosis Neck  Supple without adenopathy, thyroid not enlarged, no carotid bruits, no JVD Lungs Clear to auscultation bilaterally COR Normal S1, normal S2, regular rhythm, no murmur, quiet precordium Abdomen soft nontender with normoactive bowel sounds. No distention no bruit Rectal soft Hemoccult negative stool Extremities  No pedal edema Skin No lesions Neurological Alert and oriented x 3 Psychological Normal mood and affect  Assessment and Plan:   73 year old white female with the base abdominal pain and discrete episodes of nausea and diarrhea some of them are severe enough to need emergency room evaluation. Her GI workup has been so far negative. 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 If negative we will proceed with small bowel follow-through to look for small bowel abnormality. Psychogenic factors need to be also considered ( depression- daughters suicide)    [name] 03/13/2013

## 2013-03-16 DIAGNOSIS — E785 Hyperlipidemia, unspecified: Secondary | ICD-10-CM | POA: Insufficient documentation

## 2013-03-27 ENCOUNTER — Ambulatory Visit: Payer: Medicare Other | Admitting: Internal Medicine

## 2013-03-27 ENCOUNTER — Ambulatory Visit (HOSPITAL_COMMUNITY)
Admission: RE | Admit: 2013-03-27 | Discharge: 2013-03-27 | Disposition: A | Discharge status: Home / Self Care | Payer: Medicare Other | Source: Ambulatory Visit | Attending: Internal Medicine | Admitting: Internal Medicine

## 2013-03-27 DIAGNOSIS — R112 Nausea with vomiting, unspecified: Secondary | ICD-10-CM

## 2013-03-27 DIAGNOSIS — R634 Abnormal weight loss: Secondary | ICD-10-CM | POA: Insufficient documentation

## 2013-03-27 MED ORDER — TECHNETIUM TC 99M SULFUR COLLOID
2.1000 | Freq: Once | INTRAVENOUS | Status: AC | PRN
  Administered 2013-03-27: 2.1 via INTRAVENOUS

## 2013-03-30 ENCOUNTER — Telehealth: Payer: Self-pay | Admitting: *Deleted

## 2013-03-30 DIAGNOSIS — R109 Unspecified abdominal pain: Secondary | ICD-10-CM

## 2013-03-30 NOTE — Telephone Encounter (Signed)
Message copied by Larina Bras on Mon Mar 30, 2013  8:54 AM ------      Message from: [name]      Created: Fri Mar 27, 2013  5:33 PM       Hold off on SBFT. For now. Pt to call with recurrent N&V ------

## 2013-03-30 NOTE — Telephone Encounter (Signed)
(  See GES results for whole conversation) Called patient to advise that we can hold off on SBFT since she is feeling better. She states that her stomach is now hurting again and she would rather go forward with small bowel follow through. I have scheduled patient for Thursday, 04/02/13 @ 9:30 am. Patient verbalizes understanding.

## 2013-04-02 ENCOUNTER — Ambulatory Visit (HOSPITAL_COMMUNITY)
Admission: RE | Admit: 2013-04-02 | Discharge: 2013-04-02 | Disposition: A | Discharge status: Home / Self Care | Payer: Medicare Other | Source: Ambulatory Visit | Attending: Internal Medicine | Admitting: Internal Medicine

## 2013-04-02 DIAGNOSIS — R109 Unspecified abdominal pain: Secondary | ICD-10-CM | POA: Insufficient documentation

## 2013-04-13 ENCOUNTER — Telehealth: Payer: Self-pay | Admitting: Internal Medicine

## 2013-04-13 DIAGNOSIS — R109 Unspecified abdominal pain: Secondary | ICD-10-CM

## 2013-04-13 DIAGNOSIS — R634 Abnormal weight loss: Secondary | ICD-10-CM

## 2013-04-13 MED ORDER — PB-HYOSCY-ATROPINE-SCOPOLAMINE 16.2 MG PO TABS: ORAL_TABLET | ORAL | Status: DC

## 2013-04-13 NOTE — Telephone Encounter (Signed)
Patient given recommendations. Labs in [place], Rx to pharmacy. CT scheduled at [facility] CT on 04/17/13 at 1:00 PM. Hold Metformin day of CT. NPO 4 hours prior. Scheduled OV on 04/22/13 at 8:45 AM.

## 2013-04-13 NOTE — Telephone Encounter (Signed)
Please obtain TSH, sed.rate, amylase lipase,Ptime, ammonia.Also schedule CT scan of the abdomen( only) with attention to pancreas and liver " weight loss. Abd.pain". Start Donnatal 1 po bid, #60, 1 refill. OV first available

## 2013-04-13 NOTE — Telephone Encounter (Signed)
Patient states she is still having abdominal pain and burping. States she cannot function like this. Stopped her Tamoxifen x 30 days without any improvement so she restarted it. She is taking Protonix daily. Pain eases off some when she lays down. Pain sometimes wakes her up at night. Please, advise.

## 2013-04-14 ENCOUNTER — Telehealth: Payer: Self-pay | Admitting: Internal Medicine

## 2013-04-14 NOTE — Telephone Encounter (Signed)
Patient given Rose's number to call to verify instructions.

## 2013-04-15 ENCOUNTER — Other Ambulatory Visit (INDEPENDENT_AMBULATORY_CARE_PROVIDER_SITE_OTHER): Payer: Medicare Other

## 2013-04-15 DIAGNOSIS — R109 Unspecified abdominal pain: Secondary | ICD-10-CM

## 2013-04-15 DIAGNOSIS — E039 Hypothyroidism, unspecified: Secondary | ICD-10-CM

## 2013-04-15 LAB — TSH: TSH: 3.43 u[IU]/mL (ref 0.35–5.50)

## 2013-04-15 LAB — LIPASE: Lipase: 23 U/L (ref 11.0–59.0)

## 2013-04-15 LAB — PROTIME-INR
INR: 1 ratio (ref 0.8–1.0)
Prothrombin Time: 10.9 s (ref 10.2–12.4)

## 2013-04-15 LAB — AMYLASE: Amylase: 45 U/L (ref 27–131)

## 2013-04-15 LAB — SEDIMENTATION RATE: SED RATE: 10 mm/h (ref 0–22)

## 2013-04-15 LAB — AMMONIA: Ammonia: 11 umol/L (ref 11–35)

## 2013-04-17 ENCOUNTER — Ambulatory Visit (INDEPENDENT_AMBULATORY_CARE_PROVIDER_SITE_OTHER)
Admission: RE | Admit: 2013-04-17 | Discharge: 2013-04-17 | Disposition: A | Discharge status: Home / Self Care | Payer: Medicare Other | Source: Ambulatory Visit | Attending: Internal Medicine | Admitting: Internal Medicine

## 2013-04-17 DIAGNOSIS — R634 Abnormal weight loss: Secondary | ICD-10-CM

## 2013-04-17 DIAGNOSIS — R109 Unspecified abdominal pain: Secondary | ICD-10-CM

## 2013-04-17 MED ORDER — IOHEXOL 350 MG/ML SOLN
100.0000 mL | Freq: Once | INTRAVENOUS | Status: AC | PRN
  Administered 2013-04-17: 100 mL via INTRAVENOUS

## 2013-04-22 ENCOUNTER — Ambulatory Visit (INDEPENDENT_AMBULATORY_CARE_PROVIDER_SITE_OTHER): Payer: Medicare Other | Admitting: Internal Medicine

## 2013-04-22 ENCOUNTER — Encounter: Payer: Self-pay | Admitting: Internal Medicine

## 2013-04-22 VITALS — BP 132/68 | HR 100 | Ht 63.0 in | Wt 147.8 lb

## 2013-04-22 DIAGNOSIS — R1013 Epigastric pain: Secondary | ICD-10-CM

## 2013-04-22 DIAGNOSIS — R634 Abnormal weight loss: Secondary | ICD-10-CM

## 2013-04-22 NOTE — Patient Instructions (Signed)
Dr Coletta Memos

## 2013-04-22 NOTE — Progress Notes (Signed)
[name] [birth year] [id number]  Note: This dictation was prepared with Dragon digital system. Any transcriptional errors that result from this procedure are unintentional.   History of Present Illness:  This is a 73 year old white female with an extensive evaluation for weight loss, abdominal pain and bloating. She had an upper endoscopy, colonoscopy, CT angiogram, small bowel follow through and gastric emptying scan without a definite diagnosis. The only finding was right renal artery stenosis on CT angiogram. She has been on Paxil 20 mg a day. She is feeling much better today as a result of donnatal, one tablet twice a day. She still has some belching and indigestion but overall is feeling better and her weight has stabilized. Today, she is 147 pounds. She has a history of excessive stress related to her daughter's suicide.    Past Medical History  Diagnosis Date  . Elevated cholesterol   . GERD (gastroesophageal reflux disease)   . Thyroid disease   . Hypertension   . Coronary artery disease     stent 2007  . Depression   . Diabetes mellitus   . Hypothyroidism   . 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 Arthritis   . Carcinoma of breast treated with adjuvant chemotherapy   . Heart murmur     secondary to chem. 2013  . Anemia     childhood  . Lung nodule   . Osteoarthritis   . Shingles   . HSV-1 (herpes simplex virus 1) infection   . Hyperlipidemia   . Anxiety   . Internal hemorrhoids 12/10/03  . Diverticulosis 12/10/03  . 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 Cancer   . Pneumonia     2014 september  . Need for shingles vaccine     patient has taken shingles vaccine in 2008  . Arterial stenosis     mesenteric    Past Surgical History  Procedure Laterality Date  . Cholecystectomy    . Knee surgery  2007  . Carotid stent  2007  . Carpal tunnel release Bilateral 9/12,8,12  . Dilation and curettage of  uterus    . Coronary angioplasty    . Foot spurs    . Breast lumpectomy  1983    benign biopsy  . Breast lumpectomy Right 12/29/2010    snbx, ER/PR +, Her2 -, 0/1 node pos.  . Portacath placement  02/07/2011    Procedure: INSERTION PORT-A-CATH;  Surgeon: [name], MD;  Location: [facility];  Service: General;  Laterality: N/A;  . Port-a-cath removal  04/17/2011    Procedure: REMOVAL PORT-A-CATH;  Surgeon: [name], MD;  Location: [facility];  Service: General;  Laterality: Left;  . Hysteroscopy w/d&c N/A 09/11/2012    Procedure: DILATATION AND CURETTAGE ;  Surgeon: [name], MD;  Location: [facility];  Service: Gynecology;  Laterality: N/A;  . Esophagogastroduodenoscopy N/A 02/09/2013    Procedure: ESOPHAGOGASTRODUODENOSCOPY (EGD);  Surgeon: [name], MD;  Location: [name];  Service: Endoscopy;  Laterality: N/A;  . Colonoscopy N/A 02/09/2013    Procedure: COLONOSCOPY;  Surgeon: [name], MD;  Location: [facility];  Service: Endoscopy;  Laterality: N/A;    Allergies  Allergen Reactions  . Codeine Nausea And Vomiting and Other (See Comments)    Severe stomach cramps  . Antihistamines, Diphenhydramine-Type Other (See Comments)    Causes hyperactivity  . Statins Other (See Comments)  Joint pains  . Erythromycin Hives and Rash    Due to dental work about 50 years ago. Was used in packing and resulted in rash/hives inside and outside of mouth.    Family history and social history have been reviewed.  Review of Systems: Denies dysphagia. Positive for belching, weight stable  The remainder of the 10 point ROS is negative except as outlined in the H&P  Physical Exam: General Appearance Well developed, in no distress Psychological Normal mood and affect  Assessment and Plan:   Problem #1 Extensive evaluation for digestive symptoms which  have now improved and are most likely related to irritable bowel syndrome. She has responded to  donnatal which has a small amount of phenobarbital. She will continue taking it twice a day and eventually will taper down to one a day and eventually will stop taking it on a regular basis. I asked her to followup with [name] for renal artery stenosis which needs to be followed in terms of her blood pressure control and eventual intervention if indicated.    [name] 04/22/2013

## 2013-04-27 ENCOUNTER — Ambulatory Visit: Payer: Medicare Other | Admitting: Cardiovascular Disease

## 2013-05-06 ENCOUNTER — Telehealth: Payer: Self-pay | Admitting: Internal Medicine

## 2013-05-06 NOTE — Telephone Encounter (Signed)
Spoke with patient and she states her friend took Special educational needs teacher and it worked for him. She is asking if Dr. Olevia Perches thinks she could try this medication. Please,advise.

## 2013-05-06 NOTE — Telephone Encounter (Signed)
Left a message for patient to call me. 

## 2013-05-06 NOTE — Telephone Encounter (Signed)
OK to try Bentyl 10 mg,  1 po tid, #90, she needs to stop Donnatal and Aanaspaz listed in her medication cardex since they do the same thing.

## 2013-05-07 MED ORDER — DICYCLOMINE HCL 10 MG PO CAPS: ORAL_CAPSULE | ORAL | Status: DC

## 2013-05-07 NOTE — Telephone Encounter (Signed)
Patient given recommendations. She would like to try the Bentyl. Rx sent to pharmacy. She will stop Donnatal and Anapspaz

## 2013-06-16 ENCOUNTER — Telehealth: Payer: Self-pay | Admitting: *Deleted

## 2013-06-16 NOTE — Telephone Encounter (Signed)
Called pt to inform her of why I needed to reschedule her and she was fine with seeing Amy.  Confirmed 07/09/13 appt w pt.

## 2013-06-24 ENCOUNTER — Telehealth: Payer: Self-pay | Admitting: Oncology

## 2013-06-24 NOTE — Telephone Encounter (Signed)
returned pt call and lvm for pt to call back .Marland KitchenMarland Kitchenpt did not advise why she called

## 2013-06-30 ENCOUNTER — Other Ambulatory Visit (HOSPITAL_BASED_OUTPATIENT_CLINIC_OR_DEPARTMENT_OTHER): Payer: Medicare Other

## 2013-06-30 DIAGNOSIS — C50919 Malignant neoplasm of unspecified site of unspecified female breast: Secondary | ICD-10-CM

## 2013-06-30 DIAGNOSIS — C50519 Malignant neoplasm of lower-outer quadrant of unspecified female breast: Secondary | ICD-10-CM

## 2013-06-30 LAB — COMPREHENSIVE METABOLIC PANEL (CC13)
ALBUMIN: 3.4 g/dL — AB (ref 3.5–5.0)
ALK PHOS: 61 U/L (ref 40–150)
ALT: 16 U/L (ref 0–55)
AST: 18 U/L (ref 5–34)
Anion Gap: 11 mEq/L (ref 3–11)
BUN: 19.8 mg/dL (ref 7.0–26.0)
CO2: 22 mEq/L (ref 22–29)
Calcium: 8.7 mg/dL (ref 8.4–10.4)
Chloride: 106 mEq/L (ref 98–109)
Creatinine: 1.3 mg/dL — ABNORMAL HIGH (ref 0.6–1.1)
Glucose: 203 mg/dl — ABNORMAL HIGH (ref 70–140)
POTASSIUM: 4.3 meq/L (ref 3.5–5.1)
SODIUM: 139 meq/L (ref 136–145)
Total Bilirubin: 0.36 mg/dL (ref 0.20–1.20)
Total Protein: 6.5 g/dL (ref 6.4–8.3)

## 2013-06-30 LAB — CBC WITH DIFFERENTIAL/PLATELET
BASO%: 1.4 % (ref 0.0–2.0)
Basophils Absolute: 0.1 10*3/uL (ref 0.0–0.1)
EOS%: 8.6 % — AB (ref 0.0–7.0)
Eosinophils Absolute: 0.4 10*3/uL (ref 0.0–0.5)
HCT: 36.6 % (ref 34.8–46.6)
HGB: 12.2 g/dL (ref 11.6–15.9)
LYMPH#: 1.8 10*3/uL (ref 0.9–3.3)
LYMPH%: 34.6 % (ref 14.0–49.7)
MCH: 30.5 pg (ref 25.1–34.0)
MCHC: 33.2 g/dL (ref 31.5–36.0)
MCV: 91.8 fL (ref 79.5–101.0)
MONO#: 0.5 10*3/uL (ref 0.1–0.9)
MONO%: 9.7 % (ref 0.0–14.0)
NEUT#: 2.4 10*3/uL (ref 1.5–6.5)
NEUT%: 45.7 % (ref 38.4–76.8)
Platelets: 219 10*3/uL (ref 145–400)
RBC: 3.98 10*6/uL (ref 3.70–5.45)
RDW: 13.5 % (ref 11.2–14.5)
WBC: 5.2 10*3/uL (ref 3.9–10.3)

## 2013-07-02 ENCOUNTER — Encounter: Payer: Self-pay | Admitting: Cardiovascular Disease

## 2013-07-02 ENCOUNTER — Ambulatory Visit (INDEPENDENT_AMBULATORY_CARE_PROVIDER_SITE_OTHER): Payer: Medicare Other | Admitting: Cardiovascular Disease

## 2013-07-02 VITALS — BP 144/78 | HR 87 | Ht 60.0 in | Wt 148.6 lb

## 2013-07-02 DIAGNOSIS — I10 Essential (primary) hypertension: Secondary | ICD-10-CM

## 2013-07-02 DIAGNOSIS — K219 Gastro-esophageal reflux disease without esophagitis: Secondary | ICD-10-CM

## 2013-07-02 DIAGNOSIS — E785 Hyperlipidemia, unspecified: Secondary | ICD-10-CM

## 2013-07-02 DIAGNOSIS — I2510 Atherosclerotic heart disease of native coronary artery without angina pectoris: Secondary | ICD-10-CM

## 2013-07-02 MED ORDER — NITROGLYCERIN 0.4 MG SL SUBL: 0.4000 mg | SUBLINGUAL_TABLET | SUBLINGUAL | Status: DC | PRN

## 2013-07-02 NOTE — Assessment & Plan Note (Signed)
Multiple GI tests recently with no etiology for postprandial pain  F/U Dr Olevia Perches

## 2013-07-02 NOTE — Assessment & Plan Note (Signed)
Stable with no angina and good activity level.  Continue medical Rx  

## 2013-07-02 NOTE — Progress Notes (Signed)
Patient ID: [name], female   DOB: Jun 12, 1940, 73 y.o.   MRN: [id number] 73 yo referred by Dr Coletta Memos For CAD, HTN and elevated lipids. Last seen 3 years ago She has been doing well. She has occasional SSCP maybe 1/week. She does not take nitro for it. It subsides spontaneously. It occasionally appears muscular in nature. She has a history of stent to the RCA Most recent cath in 03/2006 showed a patent RCA stent but the patient got sick with spasm of her LAD. Her last myovue was 09/21/08 and was non-ischemic Since I last saw her she has been Rx for breast CA by Dr Jana Hakim. Had right lumpectomy with chemo and XRT ON tamoxifen. Daughter committed suicide and had two small children. Labs followed by Dr Darien Ramus   Had EGD and colon with Dr Olevia Perches in January  Seems to have some stomach pains that they cannot find etiology for but slowly improving   Needs refill on nitro    ROS: Denies fever, malais, weight loss, blurry vision, decreased visual acuity, cough, sputum, SOB, hemoptysis, pleuritic pain, palpitaitons, heartburn, abdominal pain, melena, lower extremity edema, claudication, or rash.  All other systems reviewed and negative  General: Affect appropriate Healthy:  appears stated age 73: normal Neck supple with no adenopathy JVP normal no bruits no thyromegaly Lungs clear with no wheezing and good diaphragmatic motion Heart:  S1/S2 no murmur, no rub, gallop or click PMI normal Abdomen: benighn, BS positve, no tenderness, no AAA no bruit.  No HSM or HJR Distal pulses intact with no bruits No edema Neuro non-focal Skin warm and dry No muscular weakness   Current Outpatient Prescriptions  Medication Sig Dispense Refill  . aspirin 81 MG tablet Take 81 mg by mouth daily.      . carvedilol (COREG) 12.5 MG tablet Take 6.25 mg by mouth 2 (two) times daily with a meal.       . colesevelam (WELCHOL) 625 MG tablet Take 1,875 mg by mouth 2 (two) times daily with a meal.      . fish  oil-omega-3 fatty acids 1000 MG capsule Take 1,200 mg by mouth 2 (two) times daily.      Marland Kitchen ibuprofen (ADVIL) 200 MG tablet Take 3 tablets (600 mg total) by mouth every 6 (six) hours as needed for pain.  30 tablet  0  . JANUVIA 50 MG tablet Take 50 mg by mouth daily.       Marland Kitchen levothyroxine (SYNTHROID, LEVOTHROID) 50 MCG tablet Take 50 mcg by mouth daily.      Marland Kitchen lisinopril (PRINIVIL,ZESTRIL) 40 MG tablet Take 20 mg by mouth daily.       . metFORMIN (GLUCOPHAGE) 1000 MG tablet Take 500 mg by mouth 2 (two) times daily with a meal.       . nitroGLYCERIN (NITROSTAT) 0.4 MG SL tablet Place 1 tablet (0.4 mg total) under the tongue every 5 (five) minutes as needed for chest pain.  25 tablet  3  . pantoprazole (PROTONIX) 40 MG tablet Take 40 mg by mouth daily.      Marland Kitchen PARoxetine (PAXIL) 20 MG tablet Take 20 mg by mouth every morning.      . tamoxifen (NOLVADEX) 20 MG tablet Take 1 tablet (20 mg total) by mouth daily.  90 tablet  4  . valACYclovir (VALTREX) 1000 MG tablet 2 tablets two (2) times a day as needed.       No current facility-administered medications for this visit.  Allergies  Codeine; Antihistamines, diphenhydramine-type; Statins; and Erythromycin  Electrocardiogram:    03/05/13 SR rate 88 normal   Assessment and Plan

## 2013-07-02 NOTE — Assessment & Plan Note (Signed)
Well controlled.  Continue current medications and low sodium Dash type diet.    

## 2013-07-02 NOTE — Patient Instructions (Signed)
Your physician wants you to follow-up in:  6 MONTHS WITH DR NISHAN  You will receive a reminder letter in the mail two months in advance. If you don't receive a letter, please call our office to schedule the follow-up appointment. Your physician recommends that you continue on your current medications as directed. Please refer to the Current Medication list given to you today. 

## 2013-07-02 NOTE — Assessment & Plan Note (Signed)
Cholesterol is at goal.  Continue current dose of statin and diet Rx.  No myalgias or side effects.  F/U  LFT's in 6 months. Lab Results  Component Value Date   LDLCALC 83 05/13/2008  Labs with Dr Overton Mam to statins

## 2013-07-07 ENCOUNTER — Ambulatory Visit: Payer: Medicare Other | Admitting: Oncology

## 2013-07-09 ENCOUNTER — Ambulatory Visit: Payer: Medicare Other | Admitting: Physician Assistant

## 2013-07-09 ENCOUNTER — Encounter: Payer: Self-pay | Admitting: Physician Assistant

## 2013-07-09 VITALS — BP 121/77 | HR 86 | Temp 98.6°F | Resp 20 | Ht 60.0 in | Wt 148.5 lb

## 2013-07-09 DIAGNOSIS — C50519 Malignant neoplasm of lower-outer quadrant of unspecified female breast: Secondary | ICD-10-CM

## 2013-07-09 NOTE — Progress Notes (Signed)
[name] was scheduled to be seen for a routine follow up visit today.  Unfortunately, for multiple reasons, there was a 1-hour delay in getting into her room for the visit, and she had to leave without being examined today.  She will be rescheduled per her request.  A letter is being sent to the patient in apology.  There is no charge for this visit.  Micah Flesher, PA-C 07/09/2013

## 2013-07-20 ENCOUNTER — Other Ambulatory Visit: Payer: Self-pay | Admitting: *Deleted

## 2013-08-12 ENCOUNTER — Ambulatory Visit (HOSPITAL_BASED_OUTPATIENT_CLINIC_OR_DEPARTMENT_OTHER): Payer: Medicare Other | Admitting: Adult Health

## 2013-08-12 ENCOUNTER — Encounter: Payer: Self-pay | Admitting: Adult Health

## 2013-08-12 VITALS — BP 116/72 | HR 93 | Temp 98.7°F | Resp 20 | Ht 60.0 in | Wt 148.9 lb

## 2013-08-12 DIAGNOSIS — C50919 Malignant neoplasm of unspecified site of unspecified female breast: Secondary | ICD-10-CM

## 2013-08-12 DIAGNOSIS — C50519 Malignant neoplasm of lower-outer quadrant of unspecified female breast: Secondary | ICD-10-CM

## 2013-08-12 DIAGNOSIS — Z170 Estrogen receptor positive status [ER+]: Secondary | ICD-10-CM

## 2013-08-12 NOTE — Patient Instructions (Signed)
You are doing well.  You have no sign of recurrence.  Re-start your tamoxifen when you finish with your gardening.  I recommend healthy diet, exercise, and monthly breast exams.  We will see you back in 6 months.  Please call us if you have any questions or concerns.   Breast Self-Awareness Practicing breast self-awareness may pick up problems early, prevent significant medical complications, and possibly save your life. By practicing breast self-awareness, you can become familiar with how your breasts look and feel and if your breasts are changing. This allows you to notice changes early. It can also offer you some reassurance that your breast health is good. One way to learn what is normal for your breasts and whether your breasts are changing is to do a breast self-exam. If you find a lump or something that was not present in the past, it is best to contact your caregiver right away. Other findings that should be evaluated by your caregiver include nipple discharge, especially if it is bloody; skin changes or reddening; areas where the skin seems to be pulled in (retracted); or new lumps and bumps. Breast pain is seldom associated with cancer (malignancy), but should also be evaluated by a caregiver. HOW TO PERFORM A BREAST SELF-EXAM The best time to examine your breasts is 5-7 days after your menstrual period is over. During menstruation, the breasts are lumpier, and it may be more difficult to pick up changes. If you do not menstruate, have reached menopause, or had your uterus removed (hysterectomy), you should examine your breasts at regular intervals, such as monthly. If you are breastfeeding, examine your breasts after a feeding or after using a breast pump. Breast implants do not decrease the risk for lumps or tumors, so continue to perform breast self-exams as recommended. Talk to your caregiver about how to determine the difference between the implant and breast tissue. Also, talk about the amount  of pressure you should use during the exam. Over time, you will become more familiar with the variations of your breasts and more comfortable with the exam. A breast self-exam requires you to remove all your clothes above the waist. 1. Look at your breasts and nipples. Stand in front of a mirror in a room with good lighting. With your hands on your hips, push your hands firmly downward. Look for a difference in shape, contour, and size from one breast to the other (asymmetry). Asymmetry includes puckers, dips, or bumps. Also, look for skin changes, such as reddened or scaly areas on the breasts. Look for nipple changes, such as discharge, dimpling, repositioning, or redness. 2. Carefully feel your breasts. This is best done either in the shower or tub while using soapy water or when flat on your back. Place the arm (on the side of the breast you are examining) above your head. Use the pads (not the fingertips) of your three middle fingers on your opposite hand to feel your breasts. Start in the underarm area and use  inch (2 cm) overlapping circles to feel your breast. Use 3 different levels of pressure (light, medium, and firm pressure) at each circle before moving to the next circle. The light pressure is needed to feel the tissue closest to the skin. The medium pressure will help to feel breast tissue a little deeper, while the firm pressure is needed to feel the tissue close to the ribs. Continue the overlapping circles, moving downward over the breast until you feel your ribs below your breast. Then,  move one finger-width towards the center of the body. Continue to use the  inch (2 cm) overlapping circles to feel your breast as you move slowly up toward the collar bone (clavicle) near the base of the neck. Continue the up and down exam using all 3 pressures until you reach the middle of the chest. Do this with each breast, carefully feeling for lumps or changes. 3.  Keep a written record with breast  changes or normal findings for each breast. By writing this information down, you do not need to depend only on memory for size, tenderness, or location. Write down where you are in your menstrual cycle, if you are still menstruating. Breast tissue can have some lumps or thick tissue. However, see your caregiver if you find anything that concerns you.  SEEK MEDICAL CARE IF:  You see a change in shape, contour, or size of your breasts or nipples.   You see skin changes, such as reddened or scaly areas on the breasts or nipples.   You have an unusual discharge from your nipples.   You feel a new lump or unusually thick areas.  Document Released: 01/08/2005 Document Revised: 12/26/2011 Document Reviewed: 04/25/2011 [facility] Patient Information 2015 [place], [place]. This information is not intended to replace advice given to you by your health care provider. Make sure you discuss any questions you have with your health care provider.

## 2013-08-12 NOTE — Progress Notes (Signed)
ID: [name]   DOB: 10-16-1940  MR#: [id number]  CSN#:[id number]  PCP:  [name] Inches, MD GYN:  [name] Pearson, MD SUR:  [name] Bookbinder, MD [place]:  [name] Rudd, MD OTHER:  [name] Edis, MD;  [name] Rouge, MD  CHIEF COMPLAINT:  Right Breast Cancer    HISTORY OF PRESENT ILLNESS: The patient had a screening mammogram July of 2009, which was unremarkable. Screening mammography 2012 showed a possible mass in the right breast. Right diagnostic mammogram 12/05/2010 confirmed an irregularly marginated oval mass in the right breast at the 6:00 position measuring up to 1.4 cm. On physical exam Dr. Glennon [name] was not able to palpate a discrete abnormality, but ultrasound showed an irregularly marginated hypoechoic mass measuring 1.1 cm, and containing microcalcifications.  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 Estrogen receptors were expressed with 100% positivity, same for progesterone receptors, but there was no HER-2 amplification, with the ratio by CISH being 0.96. The MIB-1 was 96%.  With this information the patient underwent bilateral breast MRIs on 12/12/2010, showing a solitary 1.4 cm irregular enhancing mass in the right breast, but no other suspicious masses and no associated adenopathy.  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 Margins were negative. Repeat HER-2 was again negative.  Her subsequent history is as detailed below.  INTERVAL HISTORY:  [name] returns alone today for followup of her right breast carcinoma. She continues on tamoxifen which she began in December 2013. She has not been taking this for the past 1-2 weeks due to it causing her to itch when she works in her garden.  She tells me that she will resume it when she is finished gardening.   Otherwise, she denies any new aches or pain, bowel/bladder changes, breast changes, or any further coner concerns.     REVIEW OF SYSTEMS:  A 10 point review of systems was conducted and is otherwise negative except for what is noted above.      PAST MEDICAL HISTORY: Past Medical History  Diagnosis Date  . Elevated cholesterol   . GERD (gastroesophageal reflux disease)   . Thyroid disease   . Hypertension   . Coronary artery disease     stent 2007  . Depression   . Diabetes mellitus   . Hypothyroidism   . 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 Arthritis   . Carcinoma of breast treated with adjuvant chemotherapy   . Heart murmur     secondary to chem. 2013  . Anemia     childhood  . Lung nodule   . Osteoarthritis   . Shingles   . HSV-1 (herpes simplex virus 1) infection   . Hyperlipidemia   . Anxiety   . Internal hemorrhoids 12/10/03  . Diverticulosis 12/10/03  . 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    Dr [name]  . Cancer   . Pneumonia     2014 september  . Need for shingles vaccine     patient has taken shingles vaccine in 2008  . Arterial stenosis     mesenteric   PAST SURGICAL HISTORY: Past Surgical History  Procedure Laterality Date  . Cholecystectomy    . Knee surgery  2007  . Carotid stent  2007  . Carpal tunnel release Bilateral 9/12,8,12  .  Dilation and curettage of uterus    . Coronary angioplasty    . Foot spurs    . Breast lumpectomy  1983    benign biopsy  . Breast lumpectomy Right 12/29/2010    snbx, ER/PR +, Her2 -, 0/1 node pos.  . Portacath placement  02/07/2011    Procedure: INSERTION PORT-A-CATH;  Surgeon: [name] Bookbinder, MD;  Location: [facility];  Service: General;  Laterality: N/A;  . Port-a-cath removal  04/17/2011    Procedure: REMOVAL PORT-A-CATH;  Surgeon: [name] Bookbinder, MD;  Location: [facility];  Service: General;  Laterality: Left;  . Hysteroscopy w/d&c N/A 09/11/2012     Procedure: DILATATION AND CURETTAGE ;  Surgeon: [name] Pearson, MD;  Location: [facility];  Service: Gynecology;  Laterality: N/A;  . Esophagogastroduodenoscopy N/A 02/09/2013    Procedure: ESOPHAGOGASTRODUODENOSCOPY (EGD);  Surgeon: [name], MD;  Location: [name];  Service: Endoscopy;  Laterality: N/A;  . Colonoscopy N/A 02/09/2013    Procedure: COLONOSCOPY;  Surgeon: [name], MD;  Location: [facility];  Service: Endoscopy;  Laterality: N/A;    FAMILY HISTORY Family History  Problem Relation Age of Onset  . Cancer Son   . Throat cancer Father   . Lymphoma Brother 81  . Colon cancer Neg Hx   . Heart disease Mother   . Alcoholism Father   . Heart attack Father   . Heart attack Brother   . Diabetes Brother   The patient's father died at the age of 43 from cancer "in his temples". The patient's mother died from renal failure at the age of 34. The patient had 3 brothers, one of whom died at the age of 36 from non-Hodgkin's lymphoma. She has one sister. There is no history of breast or ovarian cancer in the family to her knowledge.    GYNECOLOGIC HISTORY: GX P3. Menarche age 69, last menstrual period in her mid 61s.She used hormone replacement for about 10 years, discontinuing that in 2002.   SOCIAL HISTORY: (Updated 12/22/2012) She is a retired Radiation protection practitioner. Her husband [name] is a retired Quarry manager. One daughter died from suicide at the age of 38 in the setting of postpartum depression. A second daughter, [name], works in [place] as a Art therapist. Son [name] 36 is a Quarry manager in [place]. The patient has 7 grandchildren. She attends a [employer].    ADVANCED DIRECTIVES: in place  HEALTH MAINTENANCE:  (Updated 12/22/2012) History  Substance Use Topics  . Smoking status: Former Smoker -- 2.00 packs/day for 40 years    Quit date: 12/19/1997  . Smokeless tobacco: Never Used  . Alcohol Use: 3.6 oz/week    6 Glasses of wine per week      Comment: occasional    Cholesterol -- under treatment/Dr. Coletta [name]  Bone density:  Nov 2012 at [facility], "normal"  Colonoscopy:  UTD/[name] PAP:  November 2014, [facility]    Allergies  Allergen Reactions  . Codeine Nausea And Vomiting and Other (See Comments)    Severe stomach cramps  . Antihistamines, Diphenhydramine-Type Other (See Comments)    Causes hyperactivity  . Statins Other (See Comments)    Joint pains  . Erythromycin Hives and Rash    Due to dental work about 50 years ago. Was used in packing and resulted in rash/hives inside and outside of mouth.    Current Outpatient Prescriptions  Medication Sig Dispense Refill  . aspirin 81 MG tablet Take 81 mg by mouth daily.      Marland Kitchen  carvedilol (COREG) 12.5 MG tablet Take 6.25 mg by mouth 2 (two) times daily with a meal.       . colesevelam (WELCHOL) 625 MG tablet Take 1,875 mg by mouth 2 (two) times daily with a meal.      . fish oil-omega-3 fatty acids 1000 MG capsule Take 1,200 mg by mouth 2 (two) times daily.      Marland Kitchen ibuprofen (ADVIL) 200 MG tablet Take 3 tablets (600 mg total) by mouth every 6 (six) hours as needed for pain.  30 tablet  0  . JANUVIA 50 MG tablet Take 50 mg by mouth daily.       Marland Kitchen levothyroxine (SYNTHROID, LEVOTHROID) 50 MCG tablet Take 50 mcg by mouth daily.      . metFORMIN (GLUCOPHAGE) 1000 MG tablet Take 500 mg by mouth 2 (two) times daily with a meal.       . pantoprazole (PROTONIX) 40 MG tablet Take 40 mg by mouth daily.      Marland Kitchen PARoxetine (PAXIL) 20 MG tablet Take 20 mg by mouth every morning.      . nitroGLYCERIN (NITROSTAT) 0.4 MG SL tablet Place 1 tablet (0.4 mg total) under the tongue every 5 (five) minutes as needed for chest pain.  25 tablet  3  . tamoxifen (NOLVADEX) 20 MG tablet Take 1 tablet (20 mg total) by mouth daily.  90 tablet  4  . valACYclovir (VALTREX) 1000 MG tablet 2 tablets two (2) times a day as needed.       No current facility-administered medications for this visit.    OBJECTIVE:  Middle-aged white woman in no acute distress Filed Vitals:   08/12/13 1425  BP: 116/72  Pulse: 93  Temp: 98.7 F (37.1 C)  Resp: 20     Body mass index is 29.08 kg/(m^2).    ECOG FS: 1 Filed Weights   08/12/13 1425  Weight: 148 lb 14.4 oz (67.541 kg)   Physical Exam: HEENT:  Sclerae anicteric.  Oropharynx clear. Fair dentition. Buccal mucosa is pink and moist. No pharyngeal erythema. NODES:  No cervical or supraclavicular lymphadenopathy palpated.  BREAST EXAM: Breast tissue is dense to palpation bilaterally. Right breast is status post lumpectomy. There is tenderness to palpation along the lateral portion of the breast, but no palpable nodules. No skin changes. Left breast is unremarkable. Axillae are benign bilaterally, no palpable lymphadenopathy. LUNGS:  Clear to auscultation bilaterally.  No wheezes or rhonchi HEART:  Regular rate and rhythm. No murmur appreciated. ABDOMEN:  Soft, nontender.  Positive bowel sounds.  MSK:  No focal spinal tenderness to palpation. No tenderness to palpation of the right anterior or posterior ribs. Full range of motion bilaterally in the upper extremities. EXTREMITIES:  No peripheral edema.   NEURO:  Nonfocal. Well oriented.  Positive affect.     LAB RESULTS: Lab Results  Component Value Date   WBC 5.2 06/30/2013   NEUTROABS 2.4 06/30/2013   HGB 12.2 06/30/2013   HCT 36.6 06/30/2013   MCV 91.8 06/30/2013   PLT 219 06/30/2013      Chemistry      Component Value Date/Time   NA 139 06/30/2013 0938   NA 133* 03/04/2013 1132   K 4.3 06/30/2013 0938   K 3.8 03/04/2013 1132   CL 97 03/04/2013 1132   CL 106 04/10/2012 0921   CO2 22 06/30/2013 0938   CO2 21 03/04/2013 1132   BUN 19.8 06/30/2013 0938   BUN 15 03/04/2013 1132   CREATININE  1.3* 06/30/2013 0938   CREATININE 1.04 03/04/2013 1132      Component Value Date/Time   CALCIUM 8.7 06/30/2013 0938   CALCIUM 7.6* 03/04/2013 1132   ALKPHOS 61 06/30/2013 0938   ALKPHOS 44 03/04/2013 1132   AST 18 06/30/2013 0938   AST  21 03/04/2013 1132   ALT 16 06/30/2013 0938   ALT 17 03/04/2013 1132   BILITOT 0.36 06/30/2013 0938   BILITOT 0.4 03/04/2013 1132        STUDIES: Mm Digital Diagnostic Bilat  12/08/2012   CLINICAL DATA:  History of right breast cancer status post lumpectomy 2012.  EXAM: DIGITAL DIAGNOSTIC  BILATERAL MAMMOGRAM WITH CAD  COMPARISON:  With priors  ACR Breast Density Category c: The breasts are heterogeneously dense, which may obscure small masses.  FINDINGS: Stable lumpectomy changes are seen in the right breast. No suspicious mass or malignant type microcalcifications identified in either breast.  Mammographic images were processed with CAD.  IMPRESSION: No evidence of malignancy in either breast.  RECOMMENDATION: Bilateral diagnostic mammogram in 1 year is recommended.  I have discussed the findings and recommendations with the patient. Results were also provided in writing at the conclusion of the visit. If applicable, a reminder letter will be sent to the patient regarding the next appointment.  BI-RADS CATEGORY  2: Benign Finding(s)   Electronically Signed   By: Lillia Mountain M.D.   On: 12/08/2012 09:42     ASSESSMENT:72 y.o.   [place] woman   (1) status post right lumpectomy and sentinel lymph node biopsy November of 2012 for a pT1c pN0, Stage IA invasive ductal carcinoma, grade 3, strongly estrogen and progesterone receptor positive, HER-2 not amplified, with an MIB-1 of 96%. Her Oncotype recurrence score predicted a 14% risk of distant recurrence if her only systemic adjuvant therapy is tamoxifen for five years.   (2) status post two cycles of adjuvant docetaxel/cyclophosphamide, completed February 2013, the final two cycles omitted because of poor tolerance  (3) status post radiation completed April 2013  (4)  on anastrozole briefly in May 2013, discontinued secondary to side effects  (5)  on letrozole briefly in early August 2013, discontinued secondary to side effects which included  nausea and headaches  (6)  on exemestane between September and November 2013, discontinued doe to arthralgias  (7)  started on tamoxifen beginning in December 2013. The plan is to continue tamoxifen for total of 5 years, until December 2018  PLAN:    Aradhya is doing very well today.  I reviewed her lab work form her previous appointment in June with her in detail.  They were all stable.  She is doing well and has no sign of recurrence.  Rumaisa will resume Tamoxifen once she finishes gardening.  She is tolerating this well.    Jearldean voices understanding and agreement with the above plan, and will call with any changes or problems prior to her next scheduled appointment.   I did recommend she have a repeat bone density, and I did counsel her on survivorship.  I recommended healthy diet, self breast exams, and exercise.    I spent 25 minutes counseling the patient face to face.  The total time spent in the appointment was 30 minutes.  Minette Headland, [place] [phone] 08/12/2013

## 2013-08-13 ENCOUNTER — Telehealth: Payer: Self-pay | Admitting: Adult Health

## 2013-08-13 NOTE — Telephone Encounter (Signed)
cld & spoke to pt to adv of time & date for appt

## 2013-10-06 ENCOUNTER — Other Ambulatory Visit: Payer: Self-pay | Admitting: Oncology

## 2013-10-27 ENCOUNTER — Other Ambulatory Visit: Payer: Self-pay | Admitting: Obstetrics and Gynecology

## 2013-10-27 DIAGNOSIS — Z853 Personal history of malignant neoplasm of breast: Secondary | ICD-10-CM

## 2013-11-23 ENCOUNTER — Encounter: Payer: Self-pay | Admitting: Adult Health

## 2013-12-15 ENCOUNTER — Ambulatory Visit
Admission: RE | Admit: 2013-12-15 | Discharge: 2013-12-15 | Disposition: A | Discharge status: Home / Self Care | Payer: 59 | Source: Ambulatory Visit | Attending: Obstetrics and Gynecology | Admitting: Obstetrics and Gynecology

## 2013-12-15 DIAGNOSIS — Z853 Personal history of malignant neoplasm of breast: Secondary | ICD-10-CM

## 2013-12-16 ENCOUNTER — Telehealth: Payer: Self-pay | Admitting: Nurse Practitioner

## 2013-12-28 NOTE — Progress Notes (Signed)
Patient ID: [name], female   DOB: 04-Dec-1940, 73 y.o.   MRN: [id number] 73 yo f/u for  CAD, HTN and elevated lipids.  She has been doing well. She has occasional SSCP maybe 1/week. She does not take nitro for it. It subsides spontaneously. It occasionally appears muscular in nature. She has a history of stent to the RCA Most recent cath in 03/2006 showed a patent RCA stent but the patient got sick with spasm of her LAD. Her last myovue was 09/21/08 and was non-ischemic Since I last saw her she has been Rx for breast CA by Dr Jana Hakim. Had right lumpectomy with chemo and XRT ON tamoxifen. Daughter committed suicide and had two small children. Labs followed by Dr Darien Ramus   Had EGD and colon with Dr Olevia Perches in January Seems to have some stomach pains that they cannot find etiology for but slowly improving      ROS: Denies fever, malais, weight loss, blurry vision, decreased visual acuity, cough, sputum, SOB, hemoptysis, pleuritic pain, palpitaitons, heartburn, abdominal pain, melena, lower extremity edema, claudication, or rash.  All other systems reviewed and negative  General: Affect appropriate Healthy:  appears stated age 73: normal Neck supple with no adenopathy JVP normal no bruits no thyromegaly Lungs clear with no wheezing and good diaphragmatic motion Heart:  S1/S2 SEM  murmur, no rub, gallop or click PMI normal Abdomen: benighn, BS positve, no tenderness, no AAA no bruit.  No HSM or HJR Distal pulses intact with no bruits No edema Neuro non-focal Skin warm and dry No muscular weakness   Current Outpatient Prescriptions  Medication Sig Dispense Refill  . aspirin 81 MG tablet Take 81 mg by mouth daily.    . carvedilol (COREG) 12.5 MG tablet Take 6.25 mg by mouth 2 (two) times daily with a meal.     . colesevelam (WELCHOL) 625 MG tablet Take 1,875 mg by mouth 2 (two) times daily with a meal.    . fish oil-omega-3 fatty acids 1000 MG capsule Take 1,200 mg by mouth 2  (two) times daily.    Marland Kitchen ibuprofen (ADVIL) 200 MG tablet Take 3 tablets (600 mg total) by mouth every 6 (six) hours as needed for pain. 30 tablet 0  . JANUVIA 50 MG tablet Take 50 mg by mouth daily.     Marland Kitchen levothyroxine (SYNTHROID, LEVOTHROID) 50 MCG tablet Take 50 mcg by mouth daily.    . metFORMIN (GLUCOPHAGE) 1000 MG tablet Take 500 mg by mouth 2 (two) times daily with a meal.     . nitroGLYCERIN (NITROSTAT) 0.4 MG SL tablet Place 1 tablet (0.4 mg total) under the tongue every 5 (five) minutes as needed for chest pain. 25 tablet 3  . pantoprazole (PROTONIX) 40 MG tablet Take 40 mg by mouth daily.    Marland Kitchen PARoxetine (PAXIL) 20 MG tablet Take 20 mg by mouth every morning.    . tamoxifen (NOLVADEX) 20 MG tablet Take 1 tablet by mouth  daily 90 tablet 3  . valACYclovir (VALTREX) 1000 MG tablet 2 tablets two (2) times a day as needed.     No current facility-administered medications for this visit.    Allergies  Codeine; Antihistamines, diphenhydramine-type; Statins; and Erythromycin  Electrocardiogram:  03/05/13  SR rate 86 inferior J point elevation   Assessment and Plan

## 2013-12-29 ENCOUNTER — Ambulatory Visit (INDEPENDENT_AMBULATORY_CARE_PROVIDER_SITE_OTHER): Payer: Medicare Other | Admitting: Cardiovascular Disease

## 2013-12-29 ENCOUNTER — Encounter: Payer: Self-pay | Admitting: Cardiovascular Disease

## 2013-12-29 VITALS — BP 140/76 | HR 85 | Ht 60.0 in | Wt 150.4 lb

## 2013-12-29 DIAGNOSIS — E119 Type 2 diabetes mellitus without complications: Secondary | ICD-10-CM

## 2013-12-29 DIAGNOSIS — Z853 Personal history of malignant neoplasm of breast: Secondary | ICD-10-CM

## 2013-12-29 DIAGNOSIS — R011 Cardiac murmur, unspecified: Secondary | ICD-10-CM

## 2013-12-29 DIAGNOSIS — I350 Nonrheumatic aortic (valve) stenosis: Secondary | ICD-10-CM | POA: Insufficient documentation

## 2013-12-29 DIAGNOSIS — E785 Hyperlipidemia, unspecified: Secondary | ICD-10-CM

## 2013-12-29 DIAGNOSIS — I2510 Atherosclerotic heart disease of native coronary artery without angina pectoris: Secondary | ICD-10-CM

## 2013-12-29 DIAGNOSIS — I10 Essential (primary) hypertension: Secondary | ICD-10-CM

## 2013-12-29 NOTE — Assessment & Plan Note (Signed)
Discussed low carb diet.  Target hemoglobin A1c is 6.5 or less.  Continue current medications.  

## 2013-12-29 NOTE — Assessment & Plan Note (Signed)
Cholesterol is at goal.  Continue current dose of statin and diet Rx.  No myalgias or side effects.  F/U  LFT's in 6 months. Lab Results  Component Value Date   [place] 83 05/13/2008  Labs with primary

## 2013-12-29 NOTE — Assessment & Plan Note (Signed)
New finding ? AV sclerosis vs mild stenosis F/U echo

## 2013-12-29 NOTE — Patient Instructions (Signed)
Your physician recommends that you continue on your current medications as directed. Please refer to the Current Medication list given to you today.  Your physician has requested that you have an echocardiogram. Echocardiography is a painless test that uses sound waves to create images of your heart. It provides your doctor with information about the size and shape of your heart and how well your heart's chambers and valves are working. This procedure takes approximately one hour. There are no restrictions for this procedure.   Your physician wants you to follow-up in: 1 year with Dr.Nishan You will receive a reminder letter in the mail two months in advance. If you don't receive a letter, please call our office to schedule the follow-up appointment.

## 2013-12-29 NOTE — Assessment & Plan Note (Signed)
Well controlled.  Continue current medications and low sodium Dash type diet.    

## 2013-12-29 NOTE — Assessment & Plan Note (Signed)
Stable with no angina and good activity level.  Continue medical Rx  

## 2013-12-29 NOTE — Assessment & Plan Note (Signed)
Just had 3rd straight mamogram with no evidence of cancer return f/u oncology

## 2014-01-04 ENCOUNTER — Ambulatory Visit (HOSPITAL_COMMUNITY): Payer: Medicare Other | Attending: Cardiovascular Disease | Admitting: Radiology

## 2014-01-04 DIAGNOSIS — Z923 Personal history of irradiation: Secondary | ICD-10-CM | POA: Diagnosis not present

## 2014-01-04 DIAGNOSIS — R079 Chest pain, unspecified: Secondary | ICD-10-CM | POA: Diagnosis not present

## 2014-01-04 DIAGNOSIS — C50919 Malignant neoplasm of unspecified site of unspecified female breast: Secondary | ICD-10-CM | POA: Insufficient documentation

## 2014-01-04 DIAGNOSIS — R011 Cardiac murmur, unspecified: Secondary | ICD-10-CM

## 2014-01-04 DIAGNOSIS — I2510 Atherosclerotic heart disease of native coronary artery without angina pectoris: Secondary | ICD-10-CM | POA: Diagnosis not present

## 2014-01-04 DIAGNOSIS — E785 Hyperlipidemia, unspecified: Secondary | ICD-10-CM | POA: Insufficient documentation

## 2014-01-04 DIAGNOSIS — Z9221 Personal history of antineoplastic chemotherapy: Secondary | ICD-10-CM | POA: Insufficient documentation

## 2014-01-04 DIAGNOSIS — I10 Essential (primary) hypertension: Secondary | ICD-10-CM | POA: Insufficient documentation

## 2014-01-04 NOTE — Progress Notes (Signed)
Echocardiogram performed.  

## 2014-01-05 ENCOUNTER — Other Ambulatory Visit: Payer: Self-pay | Admitting: Obstetrics and Gynecology

## 2014-01-07 LAB — CYTOLOGY - PAP

## 2014-02-09 ENCOUNTER — Other Ambulatory Visit: Payer: Self-pay | Admitting: *Deleted

## 2014-02-09 DIAGNOSIS — C50519 Malignant neoplasm of lower-outer quadrant of unspecified female breast: Secondary | ICD-10-CM

## 2014-02-10 ENCOUNTER — Other Ambulatory Visit (HOSPITAL_BASED_OUTPATIENT_CLINIC_OR_DEPARTMENT_OTHER): Payer: Medicare Other

## 2014-02-10 DIAGNOSIS — C50519 Malignant neoplasm of lower-outer quadrant of unspecified female breast: Secondary | ICD-10-CM

## 2014-02-10 DIAGNOSIS — C50811 Malignant neoplasm of overlapping sites of right female breast: Secondary | ICD-10-CM

## 2014-02-10 LAB — CBC WITH DIFFERENTIAL/PLATELET
BASO%: 1 % (ref 0.0–2.0)
BASOS ABS: 0.1 10*3/uL (ref 0.0–0.1)
EOS ABS: 0.3 10*3/uL (ref 0.0–0.5)
EOS%: 6.1 % (ref 0.0–7.0)
HCT: 37 % (ref 34.8–46.6)
HGB: 12.3 g/dL (ref 11.6–15.9)
LYMPH%: 39.8 % (ref 14.0–49.7)
MCH: 30.4 pg (ref 25.1–34.0)
MCHC: 33.2 g/dL (ref 31.5–36.0)
MCV: 91.4 fL (ref 79.5–101.0)
MONO#: 0.4 10*3/uL (ref 0.1–0.9)
MONO%: 8 % (ref 0.0–14.0)
NEUT%: 45.1 % (ref 38.4–76.8)
NEUTROS ABS: 2.4 10*3/uL (ref 1.5–6.5)
Platelets: 195 10*3/uL (ref 145–400)
RBC: 4.05 10*6/uL (ref 3.70–5.45)
RDW: 13.2 % (ref 11.2–14.5)
WBC: 5.2 10*3/uL (ref 3.9–10.3)
lymph#: 2.1 10*3/uL (ref 0.9–3.3)

## 2014-02-10 LAB — COMPREHENSIVE METABOLIC PANEL (CC13)
ALK PHOS: 60 U/L (ref 40–150)
ALT: 18 U/L (ref 0–55)
ANION GAP: 10 meq/L (ref 3–11)
AST: 18 U/L (ref 5–34)
Albumin: 3.4 g/dL — ABNORMAL LOW (ref 3.5–5.0)
BUN: 19.5 mg/dL (ref 7.0–26.0)
CHLORIDE: 106 meq/L (ref 98–109)
CO2: 24 mEq/L (ref 22–29)
CREATININE: 1.3 mg/dL — AB (ref 0.6–1.1)
Calcium: 8.3 mg/dL — ABNORMAL LOW (ref 8.4–10.4)
EGFR: 40 mL/min/{1.73_m2} — ABNORMAL LOW (ref >90)
Glucose: 258 mg/dl — ABNORMAL HIGH (ref 70–140)
Potassium: 4.4 mEq/L (ref 3.5–5.1)
Sodium: 139 mEq/L (ref 136–145)
Total Bilirubin: 0.34 mg/dL (ref 0.20–1.20)
Total Protein: 6.4 g/dL (ref 6.4–8.3)

## 2014-02-17 ENCOUNTER — Encounter: Payer: Self-pay | Admitting: Nurse Practitioner

## 2014-02-17 ENCOUNTER — Ambulatory Visit: Payer: Medicare Other | Admitting: Adult Health

## 2014-02-17 ENCOUNTER — Ambulatory Visit (HOSPITAL_BASED_OUTPATIENT_CLINIC_OR_DEPARTMENT_OTHER): Payer: Medicare Other | Admitting: Nurse Practitioner

## 2014-02-17 ENCOUNTER — Other Ambulatory Visit: Payer: Medicare Other

## 2014-02-17 ENCOUNTER — Telehealth: Payer: Self-pay | Admitting: Nurse Practitioner

## 2014-02-17 ENCOUNTER — Ambulatory Visit: Payer: Medicare Other | Admitting: Nurse Practitioner

## 2014-02-17 VITALS — BP 146/78 | HR 80 | Temp 98.0°F | Resp 18 | Ht 60.0 in | Wt 152.5 lb

## 2014-02-17 DIAGNOSIS — Z853 Personal history of malignant neoplasm of breast: Secondary | ICD-10-CM

## 2014-02-17 DIAGNOSIS — C50511 Malignant neoplasm of lower-outer quadrant of right female breast: Secondary | ICD-10-CM

## 2014-02-17 NOTE — Telephone Encounter (Signed)
per pof to sch pt appt-gave tp copy of sch °

## 2014-02-17 NOTE — Progress Notes (Signed)
ID: [name]   DOB: October 15, 1940  MR#: [id number]  CSN#:[id number]  PCP:  [name], MD GYN:  [name], MD SUR:  [name], MD [place]:  [name], MD OTHER:  [name], MD;  [name], MD  CHIEF COMPLAINT:  Right Breast Cancer CURRENT TREATMENT: tamoxifen daily   BREAST CANCER HISTORY: The patient had a screening mammogram July of 2009, which was unremarkable. Screening mammography 2012 showed a possible mass in the right breast. Right diagnostic mammogram 12/05/2010 confirmed an irregularly marginated oval mass in the right breast at the 6:00 position measuring up to 1.4 cm. On physical exam Dr. [name] was not able to palpate a discrete abnormality, but ultrasound showed an irregularly marginated hypoechoic mass measuring 1.1 cm, and containing microcalcifications.  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 Estrogen receptors were expressed with 100% positivity, same for progesterone receptors, but there was no HER-2 amplification, with the ratio by CISH being 0.96. The MIB-1 was 96%.  With this information the patient underwent bilateral breast MRIs on 12/12/2010, showing a solitary 1.4 cm irregular enhancing mass in the right breast, but no other suspicious masses and no associated adenopathy.  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 Margins were negative. Repeat HER-2 was again negative.  Her subsequent history is as detailed below.  INTERVAL HISTORY:  [name] returns today for followup of her right breast carcinoma. She has been on tamoxifen since December 2013 and is tolerating it well. She rarely has hot flashes, but experiences some vaginal dryness. She has bilateral leg cramps at night that is likely due to dehydration as she does  not drink enough fluids during the day.  She has some neck pain/stiffenss that she believes is due to bone spurs or arthritis, but an aspirin or ibuprofen reduces this.   REVIEW OF SYSTEMS:  [name] denies fevers, chills, nausea, vomiting, or changes in bowel habits. 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 None are present today. She has no shortness of breath, chest pain, cough, or palpitations. She does not sleep well as a result of the leg cramps. She is also up about 3 times in the middle of the night to urinate. She has no headaches, dizziness, unexplained weight loss or night sweats. Her blood sugars and cholesterol are well controlled. A detailed review of systems is otherwise negative.   PAST MEDICAL HISTORY: Past Medical History  Diagnosis Date  . Elevated cholesterol   . GERD (gastroesophageal reflux disease)   . Thyroid disease   . Hypertension   . Coronary artery disease     stent 2007  . Depression   . Diabetes mellitus   . Hypothyroidism   . 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 Arthritis   . Carcinoma of breast treated with adjuvant chemotherapy   . Heart murmur     secondary to chem. 2013  . Anemia     childhood  . Lung nodule   . Osteoarthritis   . Shingles   . HSV-1 (herpes simplex virus 1) infection   . Hyperlipidemia   . Anxiety   . Internal hemorrhoids 12/10/03  . Diverticulosis 12/10/03  . 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 Cancer   . Pneumonia  2014 september  . Need for shingles vaccine     patient has taken shingles vaccine in 2008  . Arterial stenosis     mesenteric   PAST SURGICAL HISTORY: Past Surgical History  Procedure Laterality Date  . Cholecystectomy    . Knee surgery  2007  . Carotid stent  2007  . Carpal tunnel release Bilateral 9/12,8,12  . Dilation and curettage of uterus    . Coronary angioplasty    . Foot spurs    . Breast lumpectomy  1983    benign biopsy  .  Breast lumpectomy Right 12/29/2010    snbx, ER/PR +, Her2 -, 0/1 node pos.  . Portacath placement  02/07/2011    Procedure: INSERTION PORT-A-CATH;  Surgeon: [name], MD;  Location: [facility];  Service: General;  Laterality: N/A;  . Port-a-cath removal  04/17/2011    Procedure: REMOVAL PORT-A-CATH;  Surgeon: [name], MD;  Location: [facility];  Service: General;  Laterality: Left;  . Hysteroscopy w/d&c N/A 09/11/2012    Procedure: DILATATION AND CURETTAGE ;  Surgeon: [name], MD;  Location: [facility];  Service: Gynecology;  Laterality: N/A;  . Esophagogastroduodenoscopy N/A 02/09/2013    Procedure: ESOPHAGOGASTRODUODENOSCOPY (EGD);  Surgeon: [name], MD;  Location: [name];  Service: Endoscopy;  Laterality: N/A;  . Colonoscopy N/A 02/09/2013    Procedure: COLONOSCOPY;  Surgeon: [name], MD;  [facility]: [facility];  Service: Endoscopy;  Laterality: N/A;    FAMILY HISTORY Family History  Problem Relation Age of Onset  . Cancer Son   . Throat cancer Father   . Lymphoma Brother 80  . Colon cancer Neg Hx   . Heart disease Mother   . Alcoholism Father   . Heart attack Father   . Heart attack Brother   . Diabetes Brother   The patient's father died at the age of 82 from cancer "in his temples". The patient's mother died from renal failure at the age of 64. The patient had 3 brothers, one of whom died at the age of 96 from non-Hodgkin's lymphoma. She has one sister. There is no history of breast or ovarian cancer in the family to her knowledge.    GYNECOLOGIC HISTORY: GX P3. Menarche age 58, last menstrual period in her mid 33s.She used hormone replacement for about 10 years, discontinuing that in 2002.   SOCIAL HISTORY: (Updated 12/22/2012) She is a retired Radiation protection practitioner. Her husband [name] is a retired Quarry manager. One daughter died from suicide at the age of 98 in the setting of postpartum depression. A second daughter, [name], works in  [place] as a Art therapist. Son [name] 36 is a Quarry manager in [place]. The patient has 7 grandchildren. She attends a [employer].    ADVANCED DIRECTIVES: in place  HEALTH MAINTENANCE:  (Updated 12/22/2012) History  Substance Use Topics  . Smoking status: Former Smoker -- 2.00 packs/day for 40 years    Quit date: 12/19/1997  . Smokeless tobacco: Never Used  . Alcohol Use: 3.6 oz/week    6 Glasses of wine per week     Comment: occasional    Cholesterol -- under treatment/[name]  Bone density:  Nov 2012 at [facility], "normal"  Colonoscopy:  UTD/[facility] PAP:  November 2014, [facility]    Allergies  Allergen Reactions  . Codeine Nausea And Vomiting and Other (See Comments)    Severe stomach cramps  . Antihistamines, Diphenhydramine-Type Other (See Comments)    Causes hyperactivity  .  Statins Other (See Comments)    Joint pains  . Erythromycin Hives and Rash    Due to dental work about 50 years ago. Was used in packing and resulted in rash/hives inside and outside of mouth.    Current Outpatient Prescriptions  Medication Sig Dispense Refill  . aspirin 81 MG tablet Take 81 mg by mouth daily.    . carvedilol (COREG) 12.5 MG tablet Take 6.25 mg by mouth 2 (two) times daily with a meal.     . colesevelam (WELCHOL) 625 MG tablet 625 mg. Take 4 tablets by mouth at  dinner    . fish oil-omega-3 fatty acids 1000 MG capsule Take 1,200 mg by mouth 2 (two) times daily.    Marland Kitchen ibuprofen (ADVIL) 200 MG tablet Take 3 tablets (600 mg total) by mouth every 6 (six) hours as needed for pain. 30 tablet 0  . JANUVIA 50 MG tablet Take 50 mg by mouth daily.     Marland Kitchen levothyroxine (SYNTHROID, LEVOTHROID) 50 MCG tablet Take 50 mcg by mouth daily.    . metFORMIN (GLUCOPHAGE) 1000 MG tablet Take 500 mg by mouth 2 (two) times daily with a meal.     . pantoprazole (PROTONIX) 40 MG tablet Take 40 mg by mouth daily.    Marland Kitchen PARoxetine (PAXIL) 20 MG tablet Take 20 mg by mouth every morning.     . tamoxifen (NOLVADEX) 20 MG tablet Take 1 tablet by mouth  daily 90 tablet 3  . valACYclovir (VALTREX) 1000 MG tablet 2 tablets two (2) times a day as needed.    . nitroGLYCERIN (NITROSTAT) 0.4 MG SL tablet Place 1 tablet (0.4 mg total) under the tongue every 5 (five) minutes as needed for chest pain. (Patient not taking: Reported on 02/17/2014) 25 tablet 3   No current facility-administered medications for this visit.    OBJECTIVE: Middle-aged white woman in no acute distress  Filed Vitals:   02/17/14 1154  BP: 146/78  Pulse:   Temp:   Resp:      Body mass index is 29.78 kg/(m^2).    ECOG FS: 1 Filed Weights   02/17/14 1110  Weight: 152 lb 8 oz (69.174 kg)   Physical Exam:  Skin: warm, dry  HEENT: sclerae anicteric, conjunctivae pink, oropharynx clear. No thrush or mucositis.  Lymph Nodes: No cervical or supraclavicular lymphadenopathy  Lungs: clear to auscultation bilaterally, no rales, wheezes, or rhonci  Heart: regular rate and rhythm  Abdomen: round, soft, non tender, positive bowel sounds  Musculoskeletal: No focal spinal tenderness, no peripheral edema  Neuro: non focal, well oriented, positive affect  Breasts: right breast status post lumpectomy and radiation. No evidence of recurrent disease. Right axilla benign. Left breast unremarkable.  LAB RESULTS: Lab Results  Component Value Date   WBC 5.2 02/10/2014   NEUTROABS 2.4 02/10/2014   HGB 12.3 02/10/2014   HCT 37.0 02/10/2014   MCV 91.4 02/10/2014   PLT 195 02/10/2014      Chemistry      Component Value Date/Time   NA 139 02/10/2014 1004   NA 133* 03/04/2013 1132   K 4.4 02/10/2014 1004   K 3.8 03/04/2013 1132   CL 97 03/04/2013 1132   CL 106 04/10/2012 0921   CO2 24 02/10/2014 1004   CO2 21 03/04/2013 1132   BUN 19.5 02/10/2014 1004   BUN 15 03/04/2013 1132   CREATININE 1.3* 02/10/2014 1004   CREATININE 1.04 03/04/2013 1132      Component Value Date/Time  CALCIUM 8.3* 02/10/2014 1004   CALCIUM  7.6* 03/04/2013 1132   ALKPHOS 60 02/10/2014 1004   ALKPHOS 44 03/04/2013 1132   AST 18 02/10/2014 1004   AST 21 03/04/2013 1132   ALT 18 02/10/2014 1004   ALT 17 03/04/2013 1132   BILITOT 0.34 02/10/2014 1004   BILITOT 0.4 03/04/2013 1132        STUDIES: No results found.  Most recent mammogram on 12/15/13 was unremarkable.  ASSESSMENT:73 y.o.  [place] woman   (1) status post right lumpectomy and sentinel lymph node biopsy November of 2012 for a pT1c pN0, Stage IA invasive ductal carcinoma, grade 3, strongly estrogen and progesterone receptor positive, HER-2 not amplified, with an MIB-1 of 96%. Her Oncotype recurrence score predicted a 14% risk of distant recurrence if her only systemic adjuvant therapy is tamoxifen for five years.   (2) status post two cycles of adjuvant docetaxel/cyclophosphamide, completed February 2013, the final two cycles omitted because of poor tolerance  (3) status post radiation completed April 2013  (4)  on anastrozole briefly in May 2013, discontinued secondary to side effects  (5)  on letrozole briefly in early August 2013, discontinued secondary to side effects which included nausea and headaches  (6)  on exemestane between September and November 2013, discontinued doe to arthralgias  (7)  started on tamoxifen beginning in December 2013. The plan is to continue tamoxifen for total of 5 years, until December 2018  PLAN:  Yatziry is doing well as far as her breast cancer is concerned. She is now 3 years out from her definitive surgery with no evidence of recurrent disease. She is tolerating the tamoxifen well and the plan is to continue until December 2018.   We discussed physical activity and I suggested she pick back up with the [employer] program. She participated with this organization until she began chemo several years ago.   [place] is next due for a mammogram this November. She will return to this office for labs and a follow up visit  in January 2017. She understands and agrees with this plan. She knows the goal of treatment in her case is cure. She has been encouraged to call with any issues that might arise before her next visit here.   Marcelino Duster, [place] [phone] 02/17/2014

## 2014-09-13 ENCOUNTER — Ambulatory Visit: Payer: Medicare Other | Attending: Sports Medicine | Admitting: Physical Therapy

## 2014-09-13 ENCOUNTER — Encounter: Payer: Self-pay | Admitting: Physical Therapy

## 2014-09-13 DIAGNOSIS — R262 Difficulty in walking, not elsewhere classified: Secondary | ICD-10-CM | POA: Diagnosis present

## 2014-09-13 DIAGNOSIS — M5440 Lumbago with sciatica, unspecified side: Secondary | ICD-10-CM

## 2014-09-13 NOTE — Therapy (Signed)
[facility] [address] [place] [place], [place], [address] Phone: [phone]   Fax:  [phone]  Physical Therapy Evaluation  Patient Details  Name: [name] MRN: [id number] Date of Birth: September 11, 1940 Referring Provider:  Pedro Earls, MD  Encounter Date: 09/13/2014      PT End of Session - 09/13/14 1322    Visit Number 1   Date for PT Re-Evaluation 11/13/14   PT Start Time 1300   PT Stop Time 1350   PT Time Calculation (min) 50 min   Activity Tolerance Patient tolerated treatment well   Behavior During Therapy [facility] for tasks assessed/performed      Past Medical History  Diagnosis Date  . Elevated cholesterol   . GERD (gastroesophageal reflux disease)   . Thyroid disease   . Hypertension   . Coronary artery disease     stent 2007  . Depression   . Diabetes mellitus   . Hypothyroidism   . 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 Arthritis   . Carcinoma of breast treated with adjuvant chemotherapy   . Heart murmur     secondary to chem. 2013  . Anemia     childhood  . Lung nodule   . Osteoarthritis   . Shingles   . HSV-1 (herpes simplex virus 1) infection   . Hyperlipidemia   . Anxiety   . Internal hemorrhoids 12/10/03  . Diverticulosis 12/10/03  . 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 Cancer   . Pneumonia     2014 september  . Need for shingles vaccine     patient has taken shingles vaccine in 2008  . Arterial stenosis     mesenteric    Past Surgical History  Procedure Laterality Date  . Cholecystectomy    . Knee surgery  2007  . Carotid stent  2007  . Carpal tunnel release Bilateral 9/12,8,12  . Dilation and curettage of uterus    . Coronary angioplasty    . Foot spurs    . Breast lumpectomy  1983    benign biopsy  . Breast lumpectomy Right 12/29/2010    snbx, ER/PR +, Her2 -, 0/1 node pos.  . Portacath placement  02/07/2011     Procedure: INSERTION PORT-A-CATH;  Surgeon: [name], MD;  Location: [facility];  Service: General;  Laterality: N/A;  . Port-a-cath removal  04/17/2011    Procedure: REMOVAL PORT-A-CATH;  Surgeon: [name], MD;  Location: [facility];  Service: General;  Laterality: Left;  . Hysteroscopy w/d&c N/A 09/11/2012    Procedure: DILATATION AND CURETTAGE ;  Surgeon: [name], MD;  Location: [facility];  Service: Gynecology;  Laterality: N/A;  . Esophagogastroduodenoscopy N/A 02/09/2013    Procedure: ESOPHAGOGASTRODUODENOSCOPY (EGD);  Surgeon: [name], MD;  Location: [name];  Service: Endoscopy;  Laterality: N/A;  . Colonoscopy N/A 02/09/2013    Procedure: COLONOSCOPY;  Surgeon: [name], MD;  [facility];  Service: Endoscopy;  Laterality: N/A;    There were no vitals filed for this visit.  Visit Diagnosis:  Midline low back pain with sciatica, sciatica laterality unspecified - Plan: PT plan of care cert/re-cert  Difficulty walking - Plan: PT plan of care cert/re-cert      Subjective Assessment - 09/13/14 1257    Subjective Patient reports that she started having back pain in June, she  is unsure of a specific cause but does reports that she was gardening.   Diagnostic tests x=rays show arthritis and spurs, MRI showed stenosis   Patient Stated Goals less pain   Currently in Pain? Yes   Pain Score 3    Pain Location Back   Pain Orientation Lower   Pain Descriptors / Indicators Aching;Dull   Pain Type Chronic pain   Pain Onset More than a month ago   Pain Frequency Intermittent   Aggravating Factors  standing and walking pain up to 7/10   Pain Relieving Factors sitting, rest   Effect of Pain on Daily Activities difficulty walking, shopping or doing yard work.            [facility] PT Assessment - 09/13/14 0001    Assessment   Medical Diagnosis spinal stenosis   Onset Date/Surgical Date 07/14/14   Prior Therapy not for back    Precautions   Precautions None   Balance Screen   Has the patient fallen in the past 6 months No   Has the patient had a decrease in activity level because of a fear of falling?  No   Is the patient reluctant to leave their home because of a fear of falling?  No   Home Ecologist residence   Additional Comments does housework and yardwork   Prior Function   Level of [place]   Mining engineer Comments fwd head, rounded shoulders   ROM / Strength   AROM / PROM / Strength --  Lumbar ROm decreased 50% with pain and tightness   Flexibility   Soft Tissue Assessment /Muscle Length --  very tight in the HS and piriformis mms   Palpation   Palpation comment non tender in the lumbar but very tight in the parapsinals and into the buttocks and ITB   Special Tests    Special Tests --  manual pelvic distraction decreased pain                             PT Short Term Goals - 09/13/14 1329    PT SHORT TERM GOAL #1   Title independent with initial HEP   Time 2   Period Weeks   Status New           PT Long Term Goals - 09/13/14 1329    PT LONG TERM GOAL #1   Title increase lumbar ROM by 25%   Time 8   Period Weeks   Status New   PT LONG TERM GOAL #2   Title increase SLR to 65 degrees   Baseline currently 40 degrees   Time 8   Period Weeks   Status New   PT LONG TERM GOAL #3   Title Walk 10 minutes without pain > 3/10   Time 8   Period Weeks   Status New   PT LONG TERM GOAL #4   Title Understand proper posture and body mechanics for ADL's   Time 8   Period Weeks   Status New               Plan - 09/13/14 1324    Clinical Impression Statement Patient with very tight LE mms, having low back pain that radiates to her legs with walking or fast motions.  Flexion exercises as well as manula distraction helped.   Pt will benefit from  skilled therapeutic  intervention in order to improve on the following deficits Decreased range of motion;Decreased strength;Increased muscle spasms;Improper body mechanics;Pain   Rehab Potential Good   PT Frequency 2x / week   PT Duration 8 weeks   PT Treatment/Interventions Electrical Stimulation;Moist Heat;Traction;Ultrasound;Therapeutic activities;Functional mobility training;Therapeutic exercise;Patient/family education;Manual techniques   PT Next Visit Plan See if exercises help, may try traction   Consulted and Agree with Plan of Care Patient          G-Codes - 10-01-14 1341    Functional Assessment Tool Used foto   Functional Limitation Other PT primary   Other PT Primary Current Status (E0923) At least 40 percent but less than 60 percent impaired, limited or restricted   Other PT Primary Goal Status (R0076) At least 20 percent but less than 40 percent impaired, limited or restricted       Problem List Patient Active Problem List   Diagnosis Date Noted  . Murmur 12/29/2013  . Dense breasts 12/22/2012  . History of breast cancer 12/22/2012  . Carcinoma of breast treated with adjuvant chemotherapy   . Cancer of lower-outer quadrant of female breast 12/20/2010  . Breast cancer 12/05/2010  . HYPERCHOLESTEROLEMIA 09/14/2008  . DEPRESSION 09/14/2008  . GERD 09/14/2008  . OTITIS MEDIA, SEROUS, ACUTE, RIGHT 05/31/2008  . Type 2 diabetes mellitus 05/20/2008  . Elevated lipids 04/17/2007  . Essential hypertension 04/17/2007  . Coronary atherosclerosis 04/17/2007  . ARTHRALGIA 04/17/2007  . HYPOTHYROIDISM 04/03/2007  . OTHER ABNORMAL BLOOD CHEMISTRY 12/03/2006    Sumner Boast., PT 2014/10/01, 1:52 PM  [address] [address] [place], [place], [address] Phone: [phone]   Fax:  [phone]

## 2014-09-13 NOTE — Patient Instructions (Signed)
Knee-to-Chest: with Neck Flexion Stretch (Supine)   Pull left knee to chest, tucking chin and lifting head. Hold __10__ seconds. Relax. Repeat _10___ times per set. Do _2___ sets per session. Do __2__ sessions per day.  Trunk: Knees to Chest   Lie on firm, flat surface. Keep head and shoulders flat on surface. Tuck hands behind knees and pull to chest. Hold _10___ seconds. Repeat __10__ times. Do _2___ sessions per day. CAUTION: Movement should be gentle and slow.  Caudal Rotation: Hip Roll, Neutral Lordosis - Supine   Lie with knees bent and slightly elevated, feet flat. Tighten stomach, lower knees out to right side, rotating hips and trunk. Keep stomach tight for return. Repeat _10___ times per set. Do __2__ sets per session. Do _2___ sessions per week.  Pelvic Tilt: Anterior - Legs Bent (Supine)   Rotate pelvis up and arch back. Hold ____ seconds. Relax. Repeat ____ times per set. Do ____ sets per session. Do ____ sessions per day.  Piriformis Stretch   Lying on back, pull right knee toward opposite shoulder. Hold __30__ seconds. Repeat _4___ times. Do _2___ sessions per day.  

## 2014-09-17 ENCOUNTER — Encounter: Payer: Self-pay | Admitting: Physical Therapy

## 2014-09-17 ENCOUNTER — Ambulatory Visit: Payer: Medicare Other | Admitting: Physical Therapy

## 2014-09-17 DIAGNOSIS — M5440 Lumbago with sciatica, unspecified side: Secondary | ICD-10-CM | POA: Diagnosis not present

## 2014-09-17 DIAGNOSIS — R262 Difficulty in walking, not elsewhere classified: Secondary | ICD-10-CM

## 2014-09-17 NOTE — Therapy (Signed)
[facility] [address] [place], [place], [address] Phone: [phone]   Fax:  [phone]  Physical Therapy Treatment  Patient Details  Name: [name] MRN: [id number] Date of Birth: 03/20/1940 Referring Provider:  Kendall, Adam S, MD  Encounter Date: 09/17/2014      PT End of Session - 09/17/14 1045    Visit Number 2   Date for PT Re-Evaluation 11/13/14   PT Start Time 1002   PT Stop Time 1055   PT Time Calculation (min) 53 min   Activity Tolerance Patient limited by pain   Behavior During Therapy WFL for tasks assessed/performed      Past Medical History  Diagnosis Date  . Elevated cholesterol   . GERD (gastroesophageal reflux disease)   . Thyroid disease   . Hypertension   . Coronary artery disease     stent 2007  . Depression   . Diabetes mellitus   . Hypothyroidism   . 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 Arthritis   . Carcinoma of breast treated with adjuvant chemotherapy   . Heart murmur     secondary to chem. 2013  . Anemia     childhood  . Lung nodule   . Osteoarthritis   . Shingles   . HSV-1 (herpes simplex virus 1) infection   . Hyperlipidemia   . Anxiety   . Internal hemorrhoids 12/10/03  . Diverticulosis 12/10/03  . 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 Cancer   . Pneumonia     2014 september  . Need for shingles vaccine     patient has taken shingles vaccine in 2008  . Arterial stenosis     mesenteric    Past Surgical History  Procedure Laterality Date  . Cholecystectomy    . Knee surgery  2007  . Carotid stent  2007  . Carpal tunnel release Bilateral 9/12,8,12  . Dilation and curettage of uterus    . Coronary angioplasty    . Foot spurs    . Breast lumpectomy  1983    benign biopsy  . Breast lumpectomy Right 12/29/2010    snbx, ER/PR +, Her2 -, 0/1 node pos.  . Portacath placement  02/07/2011   Procedure: INSERTION PORT-A-CATH;  Surgeon: Matthew Wakefield, MD;  Location: [facility];  Service: General;  Laterality: N/A;  . Port-a-cath removal  04/17/2011    Procedure: REMOVAL PORT-A-CATH;  Surgeon: Matthew Wakefield, MD;  Location: [facility];  Service: General;  Laterality: Left;  . Hysteroscopy w/d&c N/A 09/11/2012    Procedure: DILATATION AND CURETTAGE ;  Surgeon: Gretchen Adkins, MD;  Location: [facility];  Service: Gynecology;  Laterality: N/A;  . Esophagogastroduodenoscopy N/A 02/09/2013    Procedure: ESOPHAGOGASTRODUODENOSCOPY (EGD);  Surgeon: Dora M Brodie, MD;  Location: [facility];  Service: Endoscopy;  Laterality: N/A;  . Colonoscopy N/A 02/09/2013    Procedure: COLONOSCOPY;  Surgeon: Dora M Brodie, MD;  Location: [facility];  Service: Endoscopy;  Laterality: N/A;    There were no vitals filed for this visit.  Visit Diagnosis:  Midline low back pain with sciatica, sciatica laterality unspecified  Difficulty walking      Subjective Assessment - 09/17/14 1030    Subjective Reports no changes after evaluation, still c/o pain in the back and at times down the legs.  Woke up today with just a lot more pain,   c/o cramping in the legs   Currently in Pain? Yes   Pain Score 8    Pain Location Back   Pain Orientation Lower   Pain Descriptors / Indicators Aching;Dull   Pain Type Chronic pain                         [facility] Adult PT Treatment/Exercise - 09/17/14 0001    Lumbar Exercises: Aerobic   Tread Mill NuStep Level 4 x 4 minutes, wanted to stop due to "cramps in legs"   Lumbar Exercises: Seated   Long Arc Quad on Chair --  on sit fit   Other Seated Lumbar Exercises on sit fit pelvic mobility and stability   Other Seated Lumbar Exercises on sit fit scapular stabilization with red tband   Lumbar Exercises: Supine   Other Supine Lumbar Exercises feet on ball K2C, rotation and bridges, ball isometric abdominal squeeze   Other Supine Lumbar  Exercises PROM of the HS, calves and piriformis mms   Moist Heat Therapy   Number Minutes Moist Heat 15 Minutes   Moist Heat Location Lumbar Spine   Electrical Stimulation   Electrical Stimulation Location IFC   Electrical Stimulation Action IFC   Electrical Stimulation Parameters supine   Electrical Stimulation Goals Pain   Traction   Type of Traction Lumbar   Min (lbs) 65   Max (lbs) 65   Hold Time static   Time 15                  PT Short Term Goals - 09/13/14 1329    PT SHORT TERM GOAL #1   Title independent with initial HEP   Time 2   Period Weeks   Status New           PT Long Term Goals - 09/13/14 1329    PT LONG TERM GOAL #1   Title increase lumbar ROM by 25%   Time 8   Period Weeks   Status New   PT LONG TERM GOAL #2   Title increase SLR to 65 degrees   Baseline currently 40 degrees   Time 8   Period Weeks   Status New   PT LONG TERM GOAL #3   Title Walk 10 minutes without pain > 3/10   Time 8   Period Weeks   Status New   PT LONG TERM GOAL #4   Title Understand proper posture and body mechanics for ADL's   Time 8   Period Weeks   Status New               Plan - 09/17/14 1046    Clinical Impression Statement Patient with increased pain when she awoke today, very gaurded motions.  Tried traction today, c/o cramping sensations in the legs   PT Next Visit Plan Will print note out next visit and send to MD with her as she sees MD on Tuesday   Consulted and Agree with Plan of Care Patient        Problem List Patient Active Problem List   Diagnosis Date Noted  . Murmur 12/29/2013  . Dense breasts 12/22/2012  . History of breast cancer 12/22/2012  . Carcinoma of breast treated with adjuvant chemotherapy   . Cancer of lower-outer quadrant of female breast 12/20/2010  . Breast cancer 12/05/2010  . HYPERCHOLESTEROLEMIA 09/14/2008  . DEPRESSION 09/14/2008  . GERD 09/14/2008  . OTITIS MEDIA, SEROUS, ACUTE, RIGHT 05/31/2008  .    Type 2 diabetes mellitus 05/20/2008  . Elevated lipids 04/17/2007  . Essential hypertension 04/17/2007  . Coronary atherosclerosis 04/17/2007  . ARTHRALGIA 04/17/2007  . HYPOTHYROIDISM 04/03/2007  . OTHER ABNORMAL BLOOD CHEMISTRY 12/03/2006    Sumner Boast., PT 09/17/2014, 10:48 AM  [address] [place], [place], [address] Phone: [phone]   Fax:  [phone]

## 2014-09-20 ENCOUNTER — Ambulatory Visit: Payer: Medicare Other | Admitting: Physical Therapy

## 2014-09-20 DIAGNOSIS — M5440 Lumbago with sciatica, unspecified side: Secondary | ICD-10-CM | POA: Diagnosis not present

## 2014-09-20 DIAGNOSIS — R262 Difficulty in walking, not elsewhere classified: Secondary | ICD-10-CM

## 2014-09-20 NOTE — Therapy (Addendum)
[facility] [place], [place], [address] Phone: [phone]   Fax:  [phone]  Physical Therapy Treatment  Patient Details  Name: [name] MRN: [id number] Date of Birth: 1940-08-21 Referring Provider:  Pedro Earls, MD  Encounter Date: 09/20/2014      PT End of Session - 09/20/14 1051    Visit Number 3   Date for PT Re-Evaluation 11/13/14   PT Start Time 1016   PT Stop Time 1055   PT Time Calculation (min) 39 min   Activity Tolerance Patient limited by pain   Behavior During Therapy [facility] for tasks assessed/performed      Past Medical History  Diagnosis Date  . Elevated cholesterol   . GERD (gastroesophageal reflux disease)   . Thyroid disease   . Hypertension   . Coronary artery disease     stent 2007  . Depression   . Diabetes mellitus   . Hypothyroidism   . 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 Arthritis   . Carcinoma of breast treated with adjuvant chemotherapy   . Heart murmur     secondary to chem. 2013  . Anemia     childhood  . Lung nodule   . Osteoarthritis   . Shingles   . HSV-1 (herpes simplex virus 1) infection   . Hyperlipidemia   . Anxiety   . Internal hemorrhoids 12/10/03  . Diverticulosis 12/10/03  . 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 Cancer   . Pneumonia     2014 september  . Need for shingles vaccine     patient has taken shingles vaccine in 2008  . Arterial stenosis     mesenteric    Past Surgical History  Procedure Laterality Date  . Cholecystectomy    . Knee surgery  2007  . Carotid stent  2007  . Carpal tunnel release Bilateral 9/12,8,12  . Dilation and curettage of uterus    . Coronary angioplasty    . Foot spurs    . Breast lumpectomy  1983    benign biopsy  . Breast lumpectomy Right 12/29/2010    snbx, ER/PR +, Her2 -, 0/1 node pos.  . Portacath placement  02/07/2011   Procedure: INSERTION PORT-A-CATH;  Surgeon: [name], MD;  Location: [facility];  Service: General;  Laterality: N/A;  . Port-a-cath removal  04/17/2011    Procedure: REMOVAL PORT-A-CATH;  Surgeon: [name], MD;  Location: [facility];  Service: General;  Laterality: Left;  . Hysteroscopy w/d&c N/A 09/11/2012    Procedure: DILATATION AND CURETTAGE ;  Surgeon: [name], MD;  Location: [facility];  Service: Gynecology;  Laterality: N/A;  . Esophagogastroduodenoscopy N/A 02/09/2013    Procedure: ESOPHAGOGASTRODUODENOSCOPY (EGD);  Surgeon: [name], MD;  Location: [name];  Service: Endoscopy;  Laterality: N/A;  . Colonoscopy N/A 02/09/2013    Procedure: COLONOSCOPY;  Surgeon: [name], MD;  Location: [facility];  Service: Endoscopy;  Laterality: N/A;    There were no vitals filed for this visit.  Visit Diagnosis:  Midline low back pain with sciatica, sciatica laterality unspecified  Difficulty walking      Subjective Assessment - 09/20/14 1018    Subjective Pt continues to report no change. Pt reports that her legs are getting worst. Pt reports a dull ache down her legs    Diagnostic tests  x=rays show arthritis and spurs, MRI showed stenosis   Currently in Pain? Yes   Pain Score 7    Pain Location Leg   Pain Orientation Right;Left   Pain Descriptors / Indicators Aching;Dull   Pain Type Chronic pain                         [facility] Adult PT Treatment/Exercise - 09/20/14 0001    Lumbar Exercises: Aerobic   Tread Mill NuStep Level 4 x 4 minutes, wanted to stop due to "cramps in legs"   Lumbar Exercises: Seated   Long Arc Quad on Chair 1 set;Both;10 reps;Weights   LAQ on Chair Weights (lbs) 2   Other Seated Lumbar Exercises on sit fit pelvic mobility and stability   Other Seated Lumbar Exercises on sit fit scapular stabilization with red tband   Lumbar Exercises: Supine   Bridge 10 reps;2 seconds  with ab set, 2 set      Straight Leg Raise 10 reps;1 second  with ab set, 2 sets    Knee/Hip Exercises: Standing   Hip ADduction Both;2 sets;10 reps  #2   Hip Extension Knee bent;10 reps;2 sets;Both  #2   Other Standing Knee Exercises standing march #2 10 reps 2 sets    Knee/Hip Exercises: Seated   Marching 2 sets;Both;10 reps;Weights   Marching Weights 2 lbs.                  PT Short Term Goals - 09/20/14 1055    PT SHORT TERM GOAL #1   Title independent with initial HEP   Status On-going           PT Long Term Goals - 09/20/14 1056    PT LONG TERM GOAL #2   Title increase SLR to 65 degrees   Status Partially Met               Plan - 09/20/14 1053    Clinical Impression Statement Pt continues to have increased pain during treatment. pt reports that traction did not help at all. Pt declined e-stim and heat reporting that it only helps a little. Pt reports no improvement since starting PT.    Pt will benefit from skilled therapeutic intervention in order to improve on the following deficits Decreased range of motion;Decreased strength;Increased muscle spasms;Improper body mechanics;Pain   Rehab Potential Good   PT Frequency 2x / week   PT Duration 8 weeks   PT Treatment/Interventions Electrical Stimulation;Moist Heat;Traction;Ultrasound;Therapeutic activities;Functional mobility training;Therapeutic exercise;Patient/family education;Manual techniques   PT Next Visit Plan continue to add core stability interventions.         Problem List Patient Active Problem List   Diagnosis Date Noted  . Murmur 12/29/2013  . Dense breasts 12/22/2012  . History of breast cancer 12/22/2012  . Carcinoma of breast treated with adjuvant chemotherapy   . Cancer of lower-outer quadrant of female breast 12/20/2010  . Breast cancer 12/05/2010  . HYPERCHOLESTEROLEMIA 09/14/2008  . DEPRESSION 09/14/2008  . GERD 09/14/2008  . OTITIS MEDIA, SEROUS, ACUTE, RIGHT 05/31/2008  . Type 2 diabetes  mellitus 05/20/2008  . Elevated lipids 04/17/2007  . Essential hypertension 04/17/2007  . Coronary atherosclerosis 04/17/2007  . ARTHRALGIA 04/17/2007  . HYPOTHYROIDISM 04/03/2007  . OTHER ABNORMAL BLOOD CHEMISTRY 12/03/2006   PHYSICAL THERAPY DISCHARGE SUMMARY  Visits from Start of Care: 3   Plan: Patient agrees to discharge.  Patient goals were not met. Patient is being discharged due to not  returning since the last visit.  ?????       Scot Jun, PTA 09/20/2014, 10:57 AM  [facility] [place], [place], [address] Phone: [phone]   Fax:  [phone]

## 2014-09-21 ENCOUNTER — Other Ambulatory Visit: Payer: Self-pay | Admitting: *Deleted

## 2014-09-21 ENCOUNTER — Telehealth: Payer: Self-pay | Admitting: Cardiovascular Disease

## 2014-09-21 MED ORDER — TAMOXIFEN CITRATE 20 MG PO TABS: ORAL_TABLET | ORAL | Status: DC

## 2014-09-21 NOTE — Telephone Encounter (Signed)
Walk in pt form-[facility]-form dropped off gave to [employer]

## 2014-09-23 ENCOUNTER — Telehealth: Payer: Self-pay | Admitting: *Deleted

## 2014-09-23 NOTE — Telephone Encounter (Signed)
PT AWARE  IS  CLEARED  FOR PROCEDURE  AND CLEARANCE FORM  WAS FILLED OUT  AND  FAXED  TO  [facility]   .Adonis Housekeeper

## 2014-09-24 ENCOUNTER — Other Ambulatory Visit: Payer: Self-pay | Admitting: Specialist

## 2014-09-24 DIAGNOSIS — M5136 Other intervertebral disc degeneration, lumbar region: Secondary | ICD-10-CM

## 2014-09-30 ENCOUNTER — Ambulatory Visit
Admission: RE | Admit: 2014-09-30 | Discharge: 2014-09-30 | Disposition: A | Discharge status: Home / Self Care | Payer: Medicare Other | Source: Ambulatory Visit | Attending: Specialist | Admitting: Specialist

## 2014-09-30 DIAGNOSIS — M5136 Other intervertebral disc degeneration, lumbar region: Secondary | ICD-10-CM

## 2014-10-05 ENCOUNTER — Ambulatory Visit: Payer: Self-pay | Admitting: Orthopedic Surgery

## 2014-10-08 ENCOUNTER — Ambulatory Visit: Payer: Self-pay | Admitting: Orthopedic Surgery

## 2014-10-08 NOTE — H&P (Signed)
[name] is an 74 y.o. female.   CHIEF COMPLAINT Bilateral lower extremity radicular pain, buttock, thigh, calf, plantar aspect of the feet.  HPI: The patient is a 74 year old female who presents with back pain. The patient is here today in referral from Dr. Delilah Shan. The patient reports low back symptoms including pain which began 9 week(s) ago without any known injury. Symptoms are reported to be located in th elow back and Symptoms include pain and weakness, while symptoms do not include numbness. The pain radiates to the left buttock, left thigh, left posterior thigh, left posterior lower leg, left foot, right buttock, right thigh, right posterior thigh, right lower leg and right foot. The patient describes the pain as aching. The patient describes the severity of their symptoms as 6 / 10. Symptoms are exacerbated by standing. Symptoms are relieved by recumbency. Current treatment includes physical therapy. Prior to being seen today the patient was previously evaluated by Dr. Delilah Shan. Past evaluation has included x-ray of the lumbar spine and MRI of the lumbar spine. Past treatment has included corticosteroids and physical therapy.    HISTORY This is a pleasant female who is kindly referred here by Dr. Delilah Shan for the evaluation of spinal stenosis. Patient reports in the beginning of the June, she started to develop pain into the legs without a traumatic event. She reports that she was evaluated by Dr. Delilah Shan. She subsequently had an MRI of her lumbar spine which indicated severe stenosis at L4-5 and moderate to severe at L3-4, mild at L2-3 and lateral recess stenosis at L5-S1. She had a dextrorotatory scoliosis. She has undergone physical therapy and has made no improvement. She has not had an epidural steroid injection. She reports she is able to walk 10 to 15 feet before she gets pain that radiates down under her legs, right greater than left. She is able to sit and get a significant reduction  of her pain following sitting where she stands and continues to walk. She leans on the cart when she goes to the grocery store. It diminish her pain. She is an non-insulin dependent diabetic. Her A1c is less than 7. She has had a history of back pain and hip pain.  REVIEW OF SYSTEMS Review of systems is negative for fevers, chest pain, shortness of breath, unexplained recent weight loss, loss of bowel or bladder function, burning with urination, joint swelling, rashes, weakness or numbness, difficulty with balance, easy bruising, excessive thirst or frequent urination. She has a history of a coronary artery stent. She takes a baby aspirin for that. Her cardiologist is Dr. Johnsie Cancel.  Past Medical History  Diagnosis Date  . Elevated cholesterol   . GERD (gastroesophageal reflux disease)   . Thyroid disease   . Hypertension   . Coronary artery disease     stent 2007  . Depression   . Diabetes mellitus   . Hypothyroidism   . 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 Arthritis   . Carcinoma of breast treated with adjuvant chemotherapy   . Heart murmur     secondary to chem. 2013  . Anemia     childhood  . Lung nodule   . Osteoarthritis   . Shingles   . HSV-1 (herpes simplex virus 1) infection   . Hyperlipidemia   . Anxiety   . Internal hemorrhoids 12/10/03  . Diverticulosis 12/10/03  . 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 Cancer   . Pneumonia     2014 september  . Need for shingles vaccine     patient has taken shingles vaccine in 2008  . Arterial stenosis     mesenteric    Past Surgical History  Procedure Laterality Date  . Cholecystectomy    . Knee surgery  2007  . Carotid stent  2007  . Carpal tunnel release Bilateral 9/12,8,12  . Dilation and curettage of uterus    . Coronary angioplasty    . Foot spurs    . Breast lumpectomy  1983    benign biopsy  . Breast lumpectomy Right 12/29/2010    snbx, ER/PR +, Her2 -, 0/1  node pos.  . Portacath placement  02/07/2011    Procedure: INSERTION PORT-A-CATH;  Surgeon: [name], MD;  Location: [facility];  Service: General;  Laterality: N/A;  . Port-a-cath removal  04/17/2011    Procedure: REMOVAL PORT-A-CATH;  Surgeon: [name], MD;  Location: [facility];  Service: General;  Laterality: Left;  . Hysteroscopy w/d&c N/A 09/11/2012    Procedure: DILATATION AND CURETTAGE ;  Surgeon: [name], MD;  Location: [facility];  Service: Gynecology;  Laterality: N/A;  . Esophagogastroduodenoscopy N/A 02/09/2013    Procedure: ESOPHAGOGASTRODUODENOSCOPY (EGD);  Surgeon: [name], MD;  Location: [name];  Service: Endoscopy;  Laterality: N/A;  . Colonoscopy N/A 02/09/2013    Procedure: COLONOSCOPY;  Surgeon: [name], MD;  Location: [facility];  Service: Endoscopy;  Laterality: N/A;    Family History  Problem Relation Age of Onset  . Cancer Son   . Throat cancer Father   . Lymphoma Brother 22  . Colon cancer Neg Hx   . Heart disease Mother   . Alcoholism Father   . Heart attack Father   . Heart attack Brother   . Diabetes Brother    Social History:  reports that she quit smoking about 16 years ago. She has never used smokeless tobacco. She reports that she drinks about 3.6 oz of alcohol per week. She reports that she does not use illicit drugs.  Allergies:  Allergies  Allergen Reactions  . Codeine Nausea And Vomiting and Other (See Comments)    Severe stomach cramps  . Antihistamines, Diphenhydramine-Type Other (See Comments)    Causes hyperactivity  . Statins Other (See Comments)    Joint pains  . Erythromycin Hives and Rash    Due to dental work about 50 years ago. Was used in packing and resulted in rash/hives inside and outside of mouth.     (Not in a hospital admission)  No results found for this or any previous visit (from the past 48 hour(s)). No results found.  Review of Systems  Constitutional: Negative.    HENT: Negative.   Eyes: Negative.   Respiratory: Negative.   Cardiovascular: Negative.   Gastrointestinal: Negative.   Genitourinary: Negative.   Musculoskeletal: Positive for back pain.  Skin: Negative.   Neurological: Positive for sensory change and focal weakness.  Psychiatric/Behavioral: Negative.     There were no vitals taken for this visit. Physical Exam  Constitutional: She is oriented to person, place, and time. She appears well-developed and well-nourished.  HENT:  Head: Normocephalic.  Eyes: Pupils are equal, round, and reactive to light.  Neck: Normal range of motion.  Cardiovascular: Normal rate.   Respiratory: Effort normal.  GI: Soft.  Musculoskeletal:  She is upright, in mild distress. She walks with an antalgic  gait, forward flexed. Straight leg raise, low back and buttock pain. She has mild quad weakness. She does not have a sensory deficit in a stocking glove distribution. She has pain with resisted extension, relieved with forward flexion and sitting.  Lumbar spine exam reveals no evidence of soft tissue swelling, deformity or skin ecchymosis. On palpation there is no tenderness of the lumbar spine. No flank pain with percussion. The abdomen is soft and nontender. Nontender over the trochanters. No cellulitis or lymphadenopathy.  Motor is 5/5 including EHL, tibialis anterior, plantar flexion, hamstrings. Patient is normoreflexic. There is no Babinski or clonus. Sensory exam is intact to light touch. No DVT. No pain and normal range of motion without instability of the hips, knees and ankles. Trace pulses in the left lower extremity, dorsalis pedis, and posterior tibial.  Neurological: She is alert and oriented to person, place, and time.  Skin: Skin is warm.   Three view radiographs, AP, lateral, flexion-extension radiographs of lumbar spine demonstrate dextrorotary thoracolumbar scoliosis, underlying osteopenia. There is calcification of the aortic  vessels.  MRI demonstrates severe stenosis at L4-5, moderate L3-4. Mild lateral recess stenosis at L5-S1, mild spinal stenosis at L2-3.  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 Minimal mechanical back pain secondary to acquired and degenerative scoliosis without instability in flexion extension, minimal back pain. 3. Underlying osteopenia. 4. Peripheral vascular disease with possible diminished flow in the left lower extremity. 5. Non-insulin dependent diabetes without evidence of neuropathy.  Extensive discussion with Ladawn concerning current pathology, relevant anatomy, and treatment options. She does have symptoms consistent with neurogenic claudication secondary to severe spinal stenosis L4-5 secondarily at L3-4. Discussed her activity modification strategies avoiding reinjury which she has been deploying. She walks 10 to 15 feet before she has symptoms which require her to sit which is significant claudication. She can get relief and then continue forward. She does have a history of coronary stent. She does take 81 mg aspirin. She does have noninsulin dependent diabetes. We did discuss epidural steroid injection. She has declined inciting family members who have had them which were temporary and were not efficacious. I discussed other options living with her symptoms which she does not want to continue to live with them. The other option discussed is lumbar decompression if medical clearance can be obtained. I would recommend a central decompression at L4-5 to include a central laminectomy at L4 to decompress a portion of L3-4 as well.  I had an extensive discussion of the risks and benefits of the lumbar decompression with the patient including bleeding, infection, damage to neurovascular structures, epidural fibrosis, CSF leak requiring repair. We also discussed increase in pain, adjacent  segment disease, recurrent disc herniation, need for future surgery including repeat decompression and/or fusion. We also discussed risks of postoperative hematoma, paralysis, anesthetic complications including DVT, PE, death, cardiopulmonary dysfunction. In addition, the perioperative and postoperative courses were discussed in detail including the rehabilitative time and return to functional activity and work. I provided the patient with an illustrated handout and utilized the appropriate surgical models.  We discussed specifically we will obtain an arterial doppler preoperatively to assess for adequate lower extremity flow, given her history of peripheral vascular disease. We will obtain preoperative clearance by her medical physician, [name] at [facility] and Dr. [name]. She is not currently a smoker, has no history DVT or PE. We discussed current study concerning continuing  with low-dose aspirin perioperatively and the sequelae postoperatively in terms of managing perioperative risk. In the interim, avoid extension, favor flexion. If she would like to obtain an epidural after this discussion or like to proceed with that, we will have her sent to Dr. Nelva Bush. No history of MRSA that is noted and again her blood glucoses are well controlled at this point in time. I appreciate the kind referral and the meticulous workup and diagnosis by Dr. Delilah Shan.  Plan microlumbar decompression L3-4, L4-5  BISSELL, JACLYN M. PA-C for Dr. Tonita Cong 10/08/2014, 3:44 PM

## 2014-10-11 ENCOUNTER — Encounter (HOSPITAL_COMMUNITY): Payer: Self-pay

## 2014-10-11 NOTE — Patient Instructions (Addendum)
YOUR PROCEDURE IS SCHEDULED ON :  10/20/14  REPORT TO [facility] MAIN ENTRANCE FOLLOW SIGNS TO EAST ELEVATOR - GO TO 3rd FLOOR CHECK IN AT 3 EAST NURSES STATION (SHORT STAY) AT:  5:30 AM  CALL THIS NUMBER IF YOU HAVE PROBLEMS THE MORNING OF SURGERY [phone]  REMEMBER:ONLY 1 PER PERSON MAY GO TO SHORT STAY WITH YOU TO GET READY THE MORNING OF YOUR SURGERY  DO NOT EAT FOOD OR DRINK LIQUIDS AFTER MIDNIGHT  TAKE THESE MEDICINES THE MORNING OF SURGERY: PAXIL / SYNTHROID / PROTONIX / CARVEDILOL / TAMOXIFEN  YOU MAY NOT HAVE ANY METAL ON YOUR BODY INCLUDING HAIR PINS AND PIERCING'S. DO NOT WEAR JEWELRY, MAKEUP, LOTIONS, POWDERS OR PERFUMES. DO NOT WEAR NAIL POLISH. DO NOT SHAVE 48 HRS PRIOR TO SURGERY. MEN MAY SHAVE FACE AND NECK.  DO NOT [place]. [facility] IS NOT RESPONSIBLE FOR VALUABLES.  CONTACTS, DENTURES OR PARTIALS MAY NOT BE WORN TO SURGERY. LEAVE SUITCASE IN CAR. CAN BE BROUGHT TO ROOM AFTER SURGERY.  PATIENTS DISCHARGED THE DAY OF SURGERY WILL NOT BE ALLOWED TO DRIVE HOME.  PLEASE READ OVER THE FOLLOWING INSTRUCTION SHEETS _________________________________________________________________________________                                          [place] - PREPARING FOR SURGERY  Before surgery, you can play an important role.  Because skin is not sterile, your skin needs to be as free of germs as possible.  You can reduce the number of germs on your skin by washing with CHG (chlorahexidine gluconate) soap before surgery.  CHG is an antiseptic cleaner which kills germs and bonds with the skin to continue killing germs even after washing. Please DO NOT use if you have an allergy to CHG or antibacterial soaps.  If your skin becomes reddened/irritated stop using the CHG and inform your nurse when you arrive at Short Stay. Do not shave (including legs and underarms) for at least 48 hours prior to the first CHG shower.  You may shave your  face. Please follow these instructions carefully:   1.  Shower with CHG Soap the night before surgery and the  morning of Surgery.   2.  If you choose to wash your hair, wash your hair first as usual with your  normal  Shampoo.   3.  After you shampoo, rinse your hair and body thoroughly to remove the  shampoo.                                         4.  Use CHG as you would any other liquid soap.  You can apply chg directly  to the skin and wash . Gently wash with scrungie or clean wascloth    5.  Apply the CHG Soap to your body ONLY FROM THE NECK DOWN.   Do not use on open                           Wound or open sores. Avoid contact with eyes, ears mouth and genitals (private parts).                        Genitals (  private parts) with your normal soap.              6.  Wash thoroughly, paying special attention to the area where your surgery  will be performed.   7.  Thoroughly rinse your body with warm water from the neck down.   8.  DO NOT shower/wash with your normal soap after using and rinsing off  the CHG Soap .                9.  Pat yourself dry with a clean towel.             10.  Wear clean night clothes to bed after shower             11.  Place clean sheets on your bed the night of your first shower and do not  sleep with pets.  Day of Surgery : Do not apply any lotions/deodorants the morning of surgery.  Please wear clean clothes to the hospital/surgery center.  FAILURE TO FOLLOW THESE INSTRUCTIONS MAY RESULT IN THE CANCELLATION OF YOUR SURGERY    PATIENT SIGNATURE_________________________________  ______________________________________________________________________     Adam Phenix  An incentive spirometer is a tool that can help keep your lungs clear and active. This tool measures how well you are filling your lungs with each breath. Taking long deep breaths may help reverse or decrease the chance of developing breathing (pulmonary) problems  (especially infection) following:  A long period of time when you are unable to move or be active. BEFORE THE PROCEDURE   If the spirometer includes an indicator to show your best effort, your nurse or respiratory therapist will set it to a desired goal.  If possible, sit up straight or lean slightly forward. Try not to slouch.  Hold the incentive spirometer in an upright position. INSTRUCTIONS FOR USE  1. Sit on the edge of your bed if possible, or sit up as far as you can in bed or on a chair. 2. Hold the incentive spirometer in an upright position. 3. Breathe out normally. 4. Place the mouthpiece in your mouth and seal your lips tightly around it. 5. Breathe in slowly and as deeply as possible, raising the piston or the ball toward the top of the column. 6. Hold your breath for 3-5 seconds or for as long as possible. Allow the piston or ball to fall to the bottom of the column. 7. Remove the mouthpiece from your mouth and breathe out normally. 8. Rest for a few seconds and repeat Steps 1 through 7 at least 10 times every 1-2 hours when you are awake. Take your time and take a few normal breaths between deep breaths. 9. The spirometer may include an indicator to show your best effort. Use the indicator as a goal to work toward during each repetition. 10. After each set of 10 deep breaths, practice coughing to be sure your lungs are clear. If you have an incision (the cut made at the time of surgery), support your incision when coughing by placing a pillow or rolled up towels firmly against it. Once you are able to get out of bed, walk around indoors and cough well. You may stop using the incentive spirometer when instructed by your caregiver.  RISKS AND COMPLICATIONS  Take your time so you do not get dizzy or light-headed.  If you are in pain, you may need to take or ask for pain medication before doing incentive spirometry. It is harder  to take a deep breath if you are having  pain. AFTER USE  Rest and breathe slowly and easily.  It can be helpful to keep track of a log of your progress. Your caregiver can provide you with a simple table to help with this. If you are using the spirometer at home, follow these instructions: [place] IF:   You are having difficultly using the spirometer.  You have trouble using the spirometer as often as instructed.  Your pain medication is not giving enough relief while using the spirometer.  You develop fever of 100.5 F (38.1 C) or higher. SEEK IMMEDIATE MEDICAL CARE IF:   You cough up bloody sputum that had not been present before.  You develop fever of 102 F (38.9 C) or greater.  You develop worsening pain at or near the incision site. MAKE SURE YOU:   Understand these instructions.  Will watch your condition.  Will get help right away if you are not doing well or get worse. Document Released: 05/21/2006 Document Revised: 04/02/2011 Document Reviewed: 07/22/2006 [facility] Patient Information 2014 [place], [place].   ________________________________________________________________________

## 2014-10-12 ENCOUNTER — Encounter (HOSPITAL_COMMUNITY)
Admission: RE | Admit: 2014-10-12 | Discharge: 2014-10-12 | Disposition: A | Discharge status: Home / Self Care | Payer: Medicare Other | Source: Ambulatory Visit | Attending: Specialist | Admitting: Specialist

## 2014-10-12 ENCOUNTER — Encounter (HOSPITAL_COMMUNITY): Payer: Self-pay

## 2014-10-12 ENCOUNTER — Ambulatory Visit (HOSPITAL_COMMUNITY)
Admission: RE | Admit: 2014-10-12 | Discharge: 2014-10-12 | Disposition: A | Discharge status: Home / Self Care | Payer: Medicare Other | Source: Ambulatory Visit | Attending: Orthopedic Surgery | Admitting: Orthopedic Surgery

## 2014-10-12 DIAGNOSIS — M419 Scoliosis, unspecified: Secondary | ICD-10-CM | POA: Diagnosis not present

## 2014-10-12 DIAGNOSIS — R9431 Abnormal electrocardiogram [ECG] [EKG]: Secondary | ICD-10-CM | POA: Insufficient documentation

## 2014-10-12 DIAGNOSIS — Z01812 Encounter for preprocedural laboratory examination: Secondary | ICD-10-CM | POA: Insufficient documentation

## 2014-10-12 DIAGNOSIS — M48061 Spinal stenosis, lumbar region without neurogenic claudication: Secondary | ICD-10-CM

## 2014-10-12 DIAGNOSIS — M4806 Spinal stenosis, lumbar region: Secondary | ICD-10-CM | POA: Insufficient documentation

## 2014-10-12 DIAGNOSIS — Z0183 Encounter for blood typing: Secondary | ICD-10-CM | POA: Diagnosis not present

## 2014-10-12 DIAGNOSIS — Z01818 Encounter for other preprocedural examination: Secondary | ICD-10-CM | POA: Insufficient documentation

## 2014-10-12 HISTORY — DX: Sleep disorder, unspecified: G47.9

## 2014-10-12 HISTORY — DX: Spinal stenosis, site unspecified: M48.00

## 2014-10-12 LAB — CBC
HEMATOCRIT: 37.1 % (ref 36.0–46.0)
HEMOGLOBIN: 12.3 g/dL (ref 12.0–15.0)
MCH: 30.4 pg (ref 26.0–34.0)
MCHC: 33.2 g/dL (ref 30.0–36.0)
MCV: 91.8 fL (ref 78.0–100.0)
PLATELETS: 230 10*3/uL (ref 150–400)
RBC: 4.04 MIL/uL (ref 3.87–5.11)
RDW: 13.1 % (ref 11.5–15.5)
WBC: 7 10*3/uL (ref 4.0–10.5)

## 2014-10-12 LAB — BASIC METABOLIC PANEL
ANION GAP: 11 (ref 5–15)
BUN: 18 mg/dL (ref 6–20)
CHLORIDE: 105 mmol/L (ref 101–111)
CO2: 26 mmol/L (ref 22–32)
CREATININE: 1.27 mg/dL — AB (ref 0.44–1.00)
Calcium: 8.8 mg/dL — ABNORMAL LOW (ref 8.9–10.3)
GFR calc non Af Amer: 41 mL/min — ABNORMAL LOW (ref >60)
GFR, EST AFRICAN AMERICAN: 48 mL/min — AB (ref >60)
Glucose, Bld: 201 mg/dL — ABNORMAL HIGH (ref 65–99)
POTASSIUM: 5.3 mmol/L — AB (ref 3.5–5.1)
SODIUM: 142 mmol/L (ref 135–145)

## 2014-10-12 LAB — ABO/RH: ABO/RH(D): O NEG

## 2014-10-12 LAB — SURGICAL PCR SCREEN
MRSA, PCR: NEGATIVE
Staphylococcus aureus: POSITIVE — AB

## 2014-10-13 ENCOUNTER — Ambulatory Visit: Payer: Self-pay | Admitting: Orthopedic Surgery

## 2014-10-13 NOTE — Progress Notes (Signed)
Rx Mupuricin called to [place] [phone] - pt notified

## 2014-10-19 NOTE — Anesthesia Preprocedure Evaluation (Addendum)
Anesthesia Evaluation  Patient identified by MRN, date of birth, ID band Patient awake    Reviewed: Allergy & Precautions, Patient's Chart, lab work & pertinent test results  Airway Mallampati: II       Dental  (+) Edentulous Upper   Pulmonary former smoker,    breath sounds clear to auscultation       Cardiovascular hypertension, + CAD and + Cardiac Stents  + Valvular Problems/Murmurs  Rhythm:Regular Rate:Normal + Systolic murmurs    Neuro/Psych    GI/Hepatic GERD  ,  Endo/Other  diabetesHypothyroidism   Renal/GU      Musculoskeletal  (+) Arthritis ,   Abdominal   Peds  Hematology   Anesthesia Other Findings   Reproductive/Obstetrics                            Anesthesia Physical Anesthesia Plan  ASA: III  Anesthesia Plan: General   Post-op Pain Management:    Induction: Intravenous  Airway Management Planned: Oral ETT  Additional Equipment:   Intra-op Plan:   Post-operative Plan: Extubation in OR  Informed Consent: I have reviewed the patients History and Physical, chart, labs and discussed the procedure including the risks, benefits and alternatives for the proposed anesthesia with the patient or authorized representative who has indicated his/her understanding and acceptance.     Plan Discussed with: CRNA and Surgeon  Anesthesia Plan Comments:         Anesthesia Quick Evaluation

## 2014-10-20 ENCOUNTER — Encounter (HOSPITAL_COMMUNITY)
Admission: RE | Disposition: A | Discharge status: Home / Self Care | Payer: Self-pay | Source: Ambulatory Visit | Attending: Specialist

## 2014-10-20 ENCOUNTER — Ambulatory Visit (HOSPITAL_COMMUNITY)
Admission: RE | Admit: 2014-10-20 | Discharge: 2014-10-22 | Disposition: A | Discharge status: Home / Self Care | Payer: Medicare Other | Source: Ambulatory Visit | Attending: Specialist | Admitting: Specialist

## 2014-10-20 ENCOUNTER — Ambulatory Visit (HOSPITAL_COMMUNITY): Payer: Medicare Other | Admitting: Anesthesiology

## 2014-10-20 ENCOUNTER — Ambulatory Visit (HOSPITAL_COMMUNITY): Payer: Medicare Other

## 2014-10-20 ENCOUNTER — Encounter (HOSPITAL_COMMUNITY): Payer: Self-pay | Admitting: *Deleted

## 2014-10-20 DIAGNOSIS — M1990 Unspecified osteoarthritis, unspecified site: Secondary | ICD-10-CM | POA: Insufficient documentation

## 2014-10-20 DIAGNOSIS — M549 Dorsalgia, unspecified: Secondary | ICD-10-CM

## 2014-10-20 DIAGNOSIS — Z853 Personal history of malignant neoplasm of breast: Secondary | ICD-10-CM | POA: Diagnosis not present

## 2014-10-20 DIAGNOSIS — Z881 Allergy status to other antibiotic agents status: Secondary | ICD-10-CM | POA: Diagnosis not present

## 2014-10-20 DIAGNOSIS — Z87891 Personal history of nicotine dependence: Secondary | ICD-10-CM | POA: Insufficient documentation

## 2014-10-20 DIAGNOSIS — I2510 Atherosclerotic heart disease of native coronary artery without angina pectoris: Secondary | ICD-10-CM | POA: Diagnosis not present

## 2014-10-20 DIAGNOSIS — E039 Hypothyroidism, unspecified: Secondary | ICD-10-CM | POA: Insufficient documentation

## 2014-10-20 DIAGNOSIS — M7138 Other bursal cyst, other site: Secondary | ICD-10-CM | POA: Diagnosis not present

## 2014-10-20 DIAGNOSIS — I10 Essential (primary) hypertension: Secondary | ICD-10-CM | POA: Diagnosis not present

## 2014-10-20 DIAGNOSIS — I739 Peripheral vascular disease, unspecified: Secondary | ICD-10-CM | POA: Diagnosis not present

## 2014-10-20 DIAGNOSIS — E119 Type 2 diabetes mellitus without complications: Secondary | ICD-10-CM | POA: Insufficient documentation

## 2014-10-20 DIAGNOSIS — M4806 Spinal stenosis, lumbar region: Secondary | ICD-10-CM | POA: Insufficient documentation

## 2014-10-20 DIAGNOSIS — F419 Anxiety disorder, unspecified: Secondary | ICD-10-CM | POA: Diagnosis not present

## 2014-10-20 DIAGNOSIS — Z885 Allergy status to narcotic agent status: Secondary | ICD-10-CM | POA: Diagnosis not present

## 2014-10-20 DIAGNOSIS — K551 Chronic vascular disorders of intestine: Secondary | ICD-10-CM | POA: Diagnosis not present

## 2014-10-20 DIAGNOSIS — K219 Gastro-esophageal reflux disease without esophagitis: Secondary | ICD-10-CM | POA: Insufficient documentation

## 2014-10-20 DIAGNOSIS — Z888 Allergy status to other drugs, medicaments and biological substances status: Secondary | ICD-10-CM | POA: Diagnosis not present

## 2014-10-20 DIAGNOSIS — F329 Major depressive disorder, single episode, unspecified: Secondary | ICD-10-CM | POA: Insufficient documentation

## 2014-10-20 DIAGNOSIS — Z7982 Long term (current) use of aspirin: Secondary | ICD-10-CM | POA: Insufficient documentation

## 2014-10-20 DIAGNOSIS — M419 Scoliosis, unspecified: Secondary | ICD-10-CM | POA: Insufficient documentation

## 2014-10-20 DIAGNOSIS — M48061 Spinal stenosis, lumbar region without neurogenic claudication: Secondary | ICD-10-CM

## 2014-10-20 HISTORY — PX: LUMBAR LAMINECTOMY/DECOMPRESSION MICRODISCECTOMY: SHX5026

## 2014-10-20 LAB — GLUCOSE, CAPILLARY
Glucose-Capillary: 186 mg/dL — ABNORMAL HIGH (ref 65–99)
Glucose-Capillary: 165 mg/dL — ABNORMAL HIGH (ref 65–99)
Glucose-Capillary: 164 mg/dL — ABNORMAL HIGH (ref 65–99)
Glucose-Capillary: 191 mg/dL — ABNORMAL HIGH (ref 65–99)

## 2014-10-20 LAB — TYPE AND SCREEN
ABO/RH(D): O NEG
Antibody Screen: NEGATIVE

## 2014-10-20 SURGERY — LUMBAR LAMINECTOMY/DECOMPRESSION MICRODISCECTOMY 2 LEVELS
Anesthesia: General | Site: Back

## 2014-10-20 MED ORDER — SODIUM CHLORIDE 0.9 % IR SOLN
Status: DC | PRN
  Administered 2014-10-20: 500 mL

## 2014-10-20 MED ORDER — ACETAMINOPHEN 325 MG PO TABS: 650.0000 mg | ORAL_TABLET | ORAL | Status: DC | PRN

## 2014-10-20 MED ORDER — MAGNESIUM CITRATE PO SOLN: 1.0000 | Freq: Once | ORAL | Status: DC | PRN

## 2014-10-20 MED ORDER — ONDANSETRON HCL 4 MG/2ML IJ SOLN
INTRAMUSCULAR | Status: AC
  Filled 2014-10-20: qty 2

## 2014-10-20 MED ORDER — ONDANSETRON HCL 4 MG/2ML IJ SOLN
INTRAMUSCULAR | Status: DC | PRN
  Administered 2014-10-20: 4 mg via INTRAVENOUS

## 2014-10-20 MED ORDER — LIDOCAINE HCL (CARDIAC) 20 MG/ML IV SOLN
INTRAVENOUS | Status: DC | PRN
  Administered 2014-10-20: 50 mg via INTRAVENOUS

## 2014-10-20 MED ORDER — MORPHINE SULFATE (PF) 2 MG/ML IV SOLN
1.0000 mg | INTRAVENOUS | Status: DC | PRN
  Administered 2014-10-20 – 2014-10-21 (×5): 1 mg via INTRAVENOUS
  Filled 2014-10-20 (×6): qty 1

## 2014-10-20 MED ORDER — PANTOPRAZOLE SODIUM 40 MG PO TBEC
40.0000 mg | DELAYED_RELEASE_TABLET | Freq: Every day | ORAL | Status: DC
  Administered 2014-10-21 – 2014-10-22 (×2): 40 mg via ORAL
  Filled 2014-10-20 (×2): qty 1

## 2014-10-20 MED ORDER — ONDANSETRON HCL 4 MG/2ML IJ SOLN: 4.0000 mg | INTRAMUSCULAR | Status: DC | PRN

## 2014-10-20 MED ORDER — CEFAZOLIN SODIUM-DEXTROSE 2-3 GM-% IV SOLR
2.0000 g | INTRAVENOUS | Status: AC
  Administered 2014-10-20: 2 g via INTRAVENOUS

## 2014-10-20 MED ORDER — PROMETHAZINE HCL 25 MG/ML IJ SOLN
6.2500 mg | INTRAMUSCULAR | Status: DC | PRN
Start: 2014-10-20 — End: 2014-10-20

## 2014-10-20 MED ORDER — SODIUM CHLORIDE 0.9 % IR SOLN
Status: AC
  Filled 2014-10-20: qty 1

## 2014-10-20 MED ORDER — HYDROCODONE-ACETAMINOPHEN 5-325 MG PO TABS: 1.0000 | ORAL_TABLET | ORAL | Status: DC | PRN

## 2014-10-20 MED ORDER — SUCCINYLCHOLINE CHLORIDE 20 MG/ML IJ SOLN
INTRAMUSCULAR | Status: DC | PRN
  Administered 2014-10-20: 100 mg via INTRAVENOUS

## 2014-10-20 MED ORDER — INSULIN ASPART 100 UNIT/ML ~~LOC~~ SOLN
0.0000 [IU] | Freq: Three times a day (TID) | SUBCUTANEOUS | Status: DC
  Administered 2014-10-21: 11 [IU] via SUBCUTANEOUS
  Administered 2014-10-21: 8 [IU] via SUBCUTANEOUS
  Administered 2014-10-21: 11 [IU] via SUBCUTANEOUS
  Administered 2014-10-22: 8 [IU] via SUBCUTANEOUS

## 2014-10-20 MED ORDER — BUPIVACAINE-EPINEPHRINE (PF) 0.5% -1:200000 IJ SOLN
INTRAMUSCULAR | Status: AC
  Filled 2014-10-20: qty 30

## 2014-10-20 MED ORDER — LEVOTHYROXINE SODIUM 50 MCG PO TABS
50.0000 ug | ORAL_TABLET | Freq: Every day | ORAL | Status: DC
  Administered 2014-10-21 – 2014-10-22 (×2): 50 ug via ORAL
  Filled 2014-10-20 (×3): qty 1

## 2014-10-20 MED ORDER — LIDOCAINE HCL (CARDIAC) 20 MG/ML IV SOLN
INTRAVENOUS | Status: AC
  Filled 2014-10-20: qty 5

## 2014-10-20 MED ORDER — MIDAZOLAM HCL 5 MG/5ML IJ SOLN
INTRAMUSCULAR | Status: DC | PRN
  Administered 2014-10-20: 2 mg via INTRAVENOUS

## 2014-10-20 MED ORDER — BISACODYL 5 MG PO TBEC: 5.0000 mg | DELAYED_RELEASE_TABLET | Freq: Every day | ORAL | Status: DC | PRN

## 2014-10-20 MED ORDER — TAMOXIFEN CITRATE 10 MG PO TABS
10.0000 mg | ORAL_TABLET | Freq: Two times a day (BID) | ORAL | Status: DC
  Filled 2014-10-20: qty 1

## 2014-10-20 MED ORDER — TAMOXIFEN CITRATE 10 MG PO TABS
20.0000 mg | ORAL_TABLET | Freq: Every day | ORAL | Status: DC
  Administered 2014-10-21 – 2014-10-22 (×2): 20 mg via ORAL
  Filled 2014-10-20 (×3): qty 2

## 2014-10-20 MED ORDER — ONDANSETRON HCL 8 MG PO TABS: 8.0000 mg | ORAL_TABLET | Freq: Three times a day (TID) | ORAL | Status: DC | PRN

## 2014-10-20 MED ORDER — DOCUSATE SODIUM 100 MG PO CAPS
100.0000 mg | ORAL_CAPSULE | Freq: Two times a day (BID) | ORAL | Status: DC
  Administered 2014-10-20 – 2014-10-22 (×4): 100 mg via ORAL

## 2014-10-20 MED ORDER — ROCURONIUM BROMIDE 100 MG/10ML IV SOLN
INTRAVENOUS | Status: AC
  Filled 2014-10-20: qty 1

## 2014-10-20 MED ORDER — CEFAZOLIN SODIUM-DEXTROSE 2-3 GM-% IV SOLR
2.0000 g | Freq: Three times a day (TID) | INTRAVENOUS | Status: AC
  Administered 2014-10-20 – 2014-10-21 (×3): 2 g via INTRAVENOUS
  Filled 2014-10-20 (×3): qty 50

## 2014-10-20 MED ORDER — PROPOFOL 10 MG/ML IV BOLUS
INTRAVENOUS | Status: DC | PRN
  Administered 2014-10-20: 120 mg via INTRAVENOUS

## 2014-10-20 MED ORDER — POTASSIUM CHLORIDE IN NACL 20-0.45 MEQ/L-% IV SOLN
INTRAVENOUS | Status: AC
  Administered 2014-10-20: 50 mL/h via INTRAVENOUS
  Administered 2014-10-21: 06:00:00 via INTRAVENOUS
  Filled 2014-10-20 (×2): qty 1000

## 2014-10-20 MED ORDER — EPHEDRINE SULFATE 50 MG/ML IJ SOLN
INTRAMUSCULAR | Status: DC | PRN
  Administered 2014-10-20 (×3): 10 mg via INTRAVENOUS

## 2014-10-20 MED ORDER — DOCUSATE SODIUM 100 MG PO CAPS: 100.0000 mg | ORAL_CAPSULE | Freq: Two times a day (BID) | ORAL | Status: DC | PRN

## 2014-10-20 MED ORDER — NITROGLYCERIN 0.4 MG SL SUBL: 0.4000 mg | SUBLINGUAL_TABLET | SUBLINGUAL | Status: DC | PRN

## 2014-10-20 MED ORDER — NEOSTIGMINE METHYLSULFATE 10 MG/10ML IV SOLN
INTRAVENOUS | Status: AC
  Filled 2014-10-20: qty 1

## 2014-10-20 MED ORDER — ACETAMINOPHEN 650 MG RE SUPP: 650.0000 mg | RECTAL | Status: DC | PRN

## 2014-10-20 MED ORDER — ALUM & MAG HYDROXIDE-SIMETH 200-200-20 MG/5ML PO SUSP: 30.0000 mL | Freq: Four times a day (QID) | ORAL | Status: DC | PRN

## 2014-10-20 MED ORDER — CEFAZOLIN SODIUM-DEXTROSE 2-3 GM-% IV SOLR
INTRAVENOUS | Status: AC
Start: 2014-10-20 — End: 2014-10-20
  Filled 2014-10-20: qty 50

## 2014-10-20 MED ORDER — FENTANYL CITRATE (PF) 250 MCG/5ML IJ SOLN
INTRAMUSCULAR | Status: AC
Start: 2014-10-20 — End: 2014-10-20
  Filled 2014-10-20: qty 25

## 2014-10-20 MED ORDER — THROMBIN 5000 UNITS EX SOLR
CUTANEOUS | Status: DC | PRN
  Administered 2014-10-20: 5000 [IU] via TOPICAL

## 2014-10-20 MED ORDER — CARVEDILOL 6.25 MG PO TABS
6.2500 mg | ORAL_TABLET | Freq: Two times a day (BID) | ORAL | Status: DC
  Administered 2014-10-20 – 2014-10-22 (×3): 6.25 mg via ORAL
  Filled 2014-10-20 (×6): qty 1

## 2014-10-20 MED ORDER — SENNOSIDES-DOCUSATE SODIUM 8.6-50 MG PO TABS: 1.0000 | ORAL_TABLET | Freq: Every evening | ORAL | Status: DC | PRN

## 2014-10-20 MED ORDER — ROCURONIUM BROMIDE 100 MG/10ML IV SOLN
INTRAVENOUS | Status: DC | PRN
  Administered 2014-10-20: 20 mg via INTRAVENOUS

## 2014-10-20 MED ORDER — PROPOFOL 10 MG/ML IV BOLUS
INTRAVENOUS | Status: AC
  Filled 2014-10-20: qty 20

## 2014-10-20 MED ORDER — PHENOL 1.4 % MT LIQD: 1.0000 | OROMUCOSAL | Status: DC | PRN

## 2014-10-20 MED ORDER — LACTATED RINGERS IV SOLN
INTRAVENOUS | Status: DC
  Administered 2014-10-20 (×2): via INTRAVENOUS

## 2014-10-20 MED ORDER — RISAQUAD PO CAPS
1.0000 | ORAL_CAPSULE | Freq: Every day | ORAL | Status: DC
  Administered 2014-10-21 – 2014-10-22 (×2): 1 via ORAL
  Filled 2014-10-20 (×3): qty 1

## 2014-10-20 MED ORDER — CLINDAMYCIN PHOSPHATE 900 MG/50ML IV SOLN
INTRAVENOUS | Status: AC
  Filled 2014-10-20: qty 50

## 2014-10-20 MED ORDER — METHOCARBAMOL 500 MG PO TABS
500.0000 mg | ORAL_TABLET | Freq: Four times a day (QID) | ORAL | Status: DC | PRN
  Administered 2014-10-21 – 2014-10-22 (×4): 500 mg via ORAL
  Filled 2014-10-20 (×4): qty 1

## 2014-10-20 MED ORDER — FENTANYL CITRATE (PF) 100 MCG/2ML IJ SOLN
INTRAMUSCULAR | Status: DC | PRN
  Administered 2014-10-20 (×2): 50 ug via INTRAVENOUS

## 2014-10-20 MED ORDER — METHOCARBAMOL 1000 MG/10ML IJ SOLN
500.0000 mg | Freq: Four times a day (QID) | INTRAVENOUS | Status: DC | PRN
  Administered 2014-10-20 – 2014-10-21 (×2): 500 mg via INTRAVENOUS
  Filled 2014-10-20 (×5): qty 5

## 2014-10-20 MED ORDER — PAROXETINE HCL 20 MG PO TABS
20.0000 mg | ORAL_TABLET | Freq: Every day | ORAL | Status: DC
  Administered 2014-10-21 – 2014-10-22 (×2): 20 mg via ORAL
  Filled 2014-10-20 (×2): qty 1

## 2014-10-20 MED ORDER — GLYCOPYRROLATE 0.2 MG/ML IJ SOLN
INTRAMUSCULAR | Status: DC | PRN
  Administered 2014-10-20: 0.4 mg via INTRAVENOUS

## 2014-10-20 MED ORDER — GLYCOPYRROLATE 0.2 MG/ML IJ SOLN
INTRAMUSCULAR | Status: AC
  Filled 2014-10-20: qty 3

## 2014-10-20 MED ORDER — NEOSTIGMINE METHYLSULFATE 10 MG/10ML IV SOLN
INTRAVENOUS | Status: DC | PRN
  Administered 2014-10-20: 3 mg via INTRAVENOUS

## 2014-10-20 MED ORDER — MIDAZOLAM HCL 2 MG/2ML IJ SOLN
INTRAMUSCULAR | Status: AC
  Filled 2014-10-20: qty 4

## 2014-10-20 MED ORDER — BUPIVACAINE-EPINEPHRINE (PF) 0.5% -1:200000 IJ SOLN
INTRAMUSCULAR | Status: DC | PRN
  Administered 2014-10-20: 10 mL via PERINEURAL

## 2014-10-20 MED ORDER — HYDROMORPHONE HCL 1 MG/ML IJ SOLN: 0.2500 mg | INTRAMUSCULAR | Status: DC | PRN

## 2014-10-20 MED ORDER — HYDROCODONE-ACETAMINOPHEN 5-325 MG PO TABS
1.0000 | ORAL_TABLET | ORAL | Status: DC | PRN
  Administered 2014-10-20 – 2014-10-22 (×5): 2 via ORAL
  Filled 2014-10-20 (×6): qty 2

## 2014-10-20 MED ORDER — CLINDAMYCIN PHOSPHATE 900 MG/50ML IV SOLN
900.0000 mg | INTRAVENOUS | Status: AC
  Administered 2014-10-20: 900 mg via INTRAVENOUS

## 2014-10-20 MED ORDER — THROMBIN 5000 UNITS EX SOLR
CUTANEOUS | Status: AC
  Filled 2014-10-20: qty 10000

## 2014-10-20 MED ORDER — MENTHOL 3 MG MT LOZG: 1.0000 | LOZENGE | OROMUCOSAL | Status: DC | PRN

## 2014-10-20 SURGICAL SUPPLY — 49 items
BAG ZIPLOCK 12X15 (MISCELLANEOUS) IMPLANT
CLEANER TIP ELECTROSURG 2X2 (MISCELLANEOUS) ×2 IMPLANT
CLOTH 2% CHLOROHEXIDINE 3PK (PERSONAL CARE ITEMS) ×2 IMPLANT
DRAPE MICROSCOPE LEICA (MISCELLANEOUS) ×2 IMPLANT
DRAPE SHEET LG 3/4 BI-LAMINATE (DRAPES) IMPLANT
DRAPE SURG 17X11 SM STRL (DRAPES) ×2 IMPLANT
DRAPE UTILITY XL STRL (DRAPES) ×2 IMPLANT
DRSG AQUACEL AG ADV 3.5X 4 (GAUZE/BANDAGES/DRESSINGS) IMPLANT
DRSG AQUACEL AG ADV 3.5X 6 (GAUZE/BANDAGES/DRESSINGS) ×2 IMPLANT
DRSG TELFA 3X8 NADH (GAUZE/BANDAGES/DRESSINGS) ×2 IMPLANT
DURAPREP 26ML APPLICATOR (WOUND CARE) ×2 IMPLANT
DURASEAL SPINE SEALANT 3ML (MISCELLANEOUS) IMPLANT
ELECT BLADE TIP CTD 4 INCH (ELECTRODE) ×2 IMPLANT
ELECT REM PT RETURN 9FT ADLT (ELECTROSURGICAL) ×2
ELECTRODE REM PT RTRN 9FT ADLT (ELECTROSURGICAL) ×1 IMPLANT
GLOVE BIOGEL PI IND STRL 7.0 (GLOVE) ×1 IMPLANT
GLOVE BIOGEL PI INDICATOR 7.0 (GLOVE) ×1
GLOVE SURG SS PI 7.0 STRL IVOR (GLOVE) ×2 IMPLANT
GLOVE SURG SS PI 7.5 STRL IVOR (GLOVE) IMPLANT
GLOVE SURG SS PI 8.0 STRL IVOR (GLOVE) ×4 IMPLANT
GOWN STRL REUS W/TWL XL LVL3 (GOWN DISPOSABLE) ×4 IMPLANT
IV CATH 14GX2 1/4 (CATHETERS) ×2 IMPLANT
KIT BASIN OR (CUSTOM PROCEDURE TRAY) ×2 IMPLANT
KIT POSITIONING SURG ANDREWS (MISCELLANEOUS) ×2 IMPLANT
MANIFOLD NEPTUNE II (INSTRUMENTS) ×2 IMPLANT
NEEDLE SPNL 18GX3.5 QUINCKE PK (NEEDLE) ×4 IMPLANT
PACK LAMINECTOMY ORTHO (CUSTOM PROCEDURE TRAY) ×2 IMPLANT
PATTIES SURGICAL .5 X.5 (GAUZE/BANDAGES/DRESSINGS) ×2 IMPLANT
PATTIES SURGICAL .75X.75 (GAUZE/BANDAGES/DRESSINGS) ×2 IMPLANT
PATTIES SURGICAL 1X1 (DISPOSABLE) ×2 IMPLANT
PEN SKIN MARKING BROAD (MISCELLANEOUS) ×2 IMPLANT
RUBBERBAND STERILE (MISCELLANEOUS) ×2 IMPLANT
SPONGE LAP 4X18 X RAY DECT (DISPOSABLE) ×2 IMPLANT
SPONGE SURGIFOAM ABS GEL 100 (HEMOSTASIS) ×2 IMPLANT
STAPLER VISISTAT (STAPLE) IMPLANT
STRIP CLOSURE SKIN 1/2X4 (GAUZE/BANDAGES/DRESSINGS) IMPLANT
SUT NURALON 4 0 TR CR/8 (SUTURE) IMPLANT
SUT PROLENE 3 0 PS 2 (SUTURE) IMPLANT
SUT VIC AB 1 CT1 27 (SUTURE) ×1
SUT VIC AB 1 CT1 27XBRD ANTBC (SUTURE) ×1 IMPLANT
SUT VIC AB 1-0 CT2 27 (SUTURE) IMPLANT
SUT VIC AB 2-0 CT1 27 (SUTURE)
SUT VIC AB 2-0 CT1 TAPERPNT 27 (SUTURE) IMPLANT
SUT VIC AB 2-0 CT2 27 (SUTURE) ×2 IMPLANT
SYR 3ML LL SCALE MARK (SYRINGE) ×2 IMPLANT
TOWEL OR 17X26 10 PK STRL BLUE (TOWEL DISPOSABLE) ×2 IMPLANT
TOWEL OR NON WOVEN STRL DISP B (DISPOSABLE) ×2 IMPLANT
TRAY FOLEY CATH 14FRSI W/METER (CATHETERS) ×2 IMPLANT
YANKAUER SUCT BULB TIP NO VENT (SUCTIONS) ×2 IMPLANT

## 2014-10-20 NOTE — Op Note (Signed)
NAMEREVEILLE, GUHL NO.:  [id number]  MEDICAL RECORD NO.:  [id number]  LOCATION:  [id number]                         FACILITY:  [facility]  PHYSICIAN:  [name], M.D.    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  PROCEDURES PERFORMED: 1. Microlumbar decompression at L4-5 and L3-4 with Gill laminectomy of     L4. 2. Foraminotomies of L4 and L5 bilaterally. 3. Excision of large synovial cyst at 4-5.  ANESTHESIA:  General.  ASSISTANT:  Cleophas Dunker, PA.  HISTORY:  A 74 year old female with bilateral lower extremity radicular pain, numbness, weakness secondary to severe multifactorial stenosis at 4-5.  She had a spinal stenosis with fairly severe compression of the thecal sac, refractory to conservative treatment.  Given the neural compressive lesion and presence of neurologic deficit, we discussed lumbar decompression at 4-5.  We felt that the neural arch of 4 was the significant additional compressor, and therefore felt prudent to get decompression at the level above at 3-4 to minimize neural compression. Risk and benefits were discussed including bleeding, infection, damage to neurovascular structures, DVT, PE, CSF leakage, need for fusion in the future, etc.  She had a mild scoliosis as well.  TECHNIQUE:  With the patient in supine position after induction of adequate general anesthesia, 2 g Kefzol and 600 clindamycin, she was placed prone on the [place] frame.  All bony prominences were well padded.  Lumbar region was prepped and draped in usual sterile fashion. Two 18-gauge spinal needles were utilized to localize 3-4 and 4-5 interspace, confirmed with x-ray.  Incision was made from above the spinous process of 3 to below 5,  subcutaneous tissue was dissected. Electrocautery was utilized to achieve hemostasis.  A 0.25% Marcaine with epinephrine was infiltrated in the perimuscular tissues.  The dorsolumbar fascia divided in line with the skin incision.  Paraspinous muscle elevated from lamina of 3-4 and 4-5.  McCullough retractor was placed.  Operating microscope was draped and brought on the surgical field.  After confirmatory radiograph obtained, I removed the spinous process of 4 and partial of 3 and of 5.  Identified the cephalad edge of the lamina of 4.  We skeletonized with a small curette.  We detached ligamentum flavum from the cephalad edge utilizing a straight curette. We then performed a hemilaminotomy at caudad edge of 3.  Hypertrophic ligamentum flavum was noted at 3-4.  Following detachment of ligamentum flavum, I placed a neuro probe beneath the neural arch and ligamentum flavum for protection of the thecal sac.  We then removed the ligamentum flavum from the interspace at 3-4 preserving the facets and the lamina of 3.  We then began foraminotomies of 4 bilaterally, again protecting the neural elements.  Then skeletonized the attachment of the ligamentum flavum.  Cephalad edge of 5, removed synovial cyst material beneath the facets bilaterally with a bare rongeur.  Detached ligamentum flavum from the cephalad edge of 5 utilizing a straight curette.  I then performed hemilaminotomies of the caudad edge of 4 until the detachment of the ligamentum of flavum.  I then used a Youth worker and a small neural patty beneath the neural arch of 4 assuring no adhesions and then completed the removal of the neural arch of 4, we decompressed lateral recesses to the medial border of the pedicle.  Severe stenosis was noted here at the neural arch of 4.  There were adhesions noted to thecal sac, hypertrophic ligamentum flavum, and interspace of 4-5, and a large synovial cyst was noted as well central and to  the right adhesion to the thecal sac.  Meticulously developed a plane between the synovial cyst and the thecal sac.  We decompressed the lateral recesses bilaterally to the medial border of the pedicle utilizing 2-mm Kerrison  removing hypertrophic ligamentum flavum and facet hypertrophy, less than 25% of the facet was removed bilaterally.  We then removed the synovial cyst measuring 1 x 1 cm.  This was sent to Pathology for appropriate analysis, that appeared to be a benign synovial cyst.  We performed generous foraminotomies of 5 decompressing the 5 roots and then the foramen of 4 foraminotomies were performed as well removing osteophytes and ligamentum flavum hypertrophy.  Following that, there was excellent restoration of the thecal sac.  Neural probe passed freely at the foramen of 3, 4, and 5 bilaterally.  Bipolar electrocautery was utilized to achieve hemostasis.  Confirmatory radiograph obtained with a probe in the foramen of 3 and 5.  No evidence of CSF leakage or active bleeding. Copiously irrigated wound with antibiotic irrigation.  No active bleeding.  Thrombin-soaked Gelfoam was placed in laminotomy defect.  We removed the [facility] retractor, irrigated the paraspinous musculature, closed the dorsolumbar fascia with 1 Vicryl, subcu with 2-0, skin with staples.  Wound was dressed sterilely.  Placed supine on the hospital bed, extubated without difficulty, and transported to the recovery room in satisfactory condition.  The patient tolerated the procedure well.  No complications.  Assistant, Cleophas Dunker, [place].  Minimal blood loss.     [name], M.D.     Geralynn Rile  D:  10/20/2014  T:  10/20/2014  Job:  [id number]

## 2014-10-20 NOTE — Transfer of Care (Signed)
Immediate Anesthesia Transfer of Care Note  Patient: [name]  Procedure(s) Performed: Procedure(s): MICRO LUMBER DECOMPRESSION L3-4 L4-5 (N/A)  Patient Location: PACU  Anesthesia Type:General  Level of Consciousness: awake, alert  and oriented  Airway & Oxygen Therapy: Patient Spontanous Breathing and Patient connected to face mask oxygen  Post-op Assessment: Report given to RN and Post -op Vital signs reviewed and stable  Post vital signs: Reviewed and stable  Last Vitals:  Filed Vitals:   10/20/14 0511  BP: 157/95  Pulse: 79  Temp: 36.3 C  Resp: 18    Complications: No apparent anesthesia complications

## 2014-10-20 NOTE — Anesthesia Postprocedure Evaluation (Signed)
  Anesthesia Post-op Note  Patient: [name]  Procedure(s) Performed: Procedure(s): MICRO LUMBER DECOMPRESSION L3-4 L4-5 (N/A)  Patient Location: PACU  Anesthesia Type:General  Level of Consciousness: awake and alert   Airway and Oxygen Therapy: Patient Spontanous Breathing  Post-op Pain: mild  Post-op Assessment: Post-op Vital signs reviewed              Post-op Vital Signs: stable  Last Vitals:  Filed Vitals:   10/20/14 1241  BP: 159/71  Pulse: 91  Temp: 37 C  Resp: 16    Complications: No apparent anesthesia complications

## 2014-10-20 NOTE — H&P (View-Only) (Signed)
[name] is an 74 y.o. female.   CHIEF COMPLAINT Bilateral lower extremity radicular pain, buttock, thigh, calf, plantar aspect of the feet.  HPI: The patient is a 74 year old female who presents with back pain. The patient is here today in referral from Dr. Delilah Shan. The patient reports low back symptoms including pain which began 9 week(s) ago without any known injury. Symptoms are reported to be located in th elow back and Symptoms include pain and weakness, while symptoms do not include numbness. The pain radiates to the left buttock, left thigh, left posterior thigh, left posterior lower leg, left foot, right buttock, right thigh, right posterior thigh, right lower leg and right foot. The patient describes the pain as aching. The patient describes the severity of their symptoms as 6 / 10. Symptoms are exacerbated by standing. Symptoms are relieved by recumbency. Current treatment includes physical therapy. Prior to being seen today the patient was previously evaluated by Dr. Delilah Shan. Past evaluation has included x-ray of the lumbar spine and MRI of the lumbar spine. Past treatment has included corticosteroids and physical therapy.    HISTORY This is a pleasant female who is kindly referred here by Dr. Delilah Shan for the evaluation of spinal stenosis. Patient reports in the beginning of the June, she started to develop pain into the legs without a traumatic event. She reports that she was evaluated by Dr. Delilah Shan. She subsequently had an MRI of her lumbar spine which indicated severe stenosis at L4-5 and moderate to severe at L3-4, mild at L2-3 and lateral recess stenosis at L5-S1. She had a dextrorotatory scoliosis. She has undergone physical therapy and has made no improvement. She has not had an epidural steroid injection. She reports she is able to walk 10 to 15 feet before she gets pain that radiates down under her legs, right greater than left. She is able to sit and get a significant reduction  of her pain following sitting where she stands and continues to walk. She leans on the cart when she goes to the grocery store. It diminish her pain. She is an non-insulin dependent diabetic. Her A1c is less than 7. She has had a history of back pain and hip pain.  REVIEW OF SYSTEMS Review of systems is negative for fevers, chest pain, shortness of breath, unexplained recent weight loss, loss of bowel or bladder function, burning with urination, joint swelling, rashes, weakness or numbness, difficulty with balance, easy bruising, excessive thirst or frequent urination. She has a history of a coronary artery stent. She takes a baby aspirin for that. Her cardiologist is Dr. Johnsie Cancel.  Past Medical History  Diagnosis Date  . Elevated cholesterol   . GERD (gastroesophageal reflux disease)   . Thyroid disease   . Hypertension   . Coronary artery disease     stent 2007  . Depression   . Diabetes mellitus   . Hypothyroidism   . 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 Arthritis   . Carcinoma of breast treated with adjuvant chemotherapy   . Heart murmur     secondary to chem. 2013  . Anemia     childhood  . Lung nodule   . Osteoarthritis   . Shingles   . HSV-1 (herpes simplex virus 1) infection   . Hyperlipidemia   . Anxiety   . Internal hemorrhoids 12/10/03  . Diverticulosis 12/10/03  . 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 Cancer   . Pneumonia     2014 september  . Need for shingles vaccine     patient has taken shingles vaccine in 2008  . Arterial stenosis     mesenteric    Past Surgical History  Procedure Laterality Date  . Cholecystectomy    . Knee surgery  2007  . Carotid stent  2007  . Carpal tunnel release Bilateral 9/12,8,12  . Dilation and curettage of uterus    . Coronary angioplasty    . Foot spurs    . Breast lumpectomy  1983    benign biopsy  . Breast lumpectomy Right 12/29/2010    snbx, ER/PR +, Her2 -, 0/1  node pos.  . Portacath placement  02/07/2011    Procedure: INSERTION PORT-A-CATH;  Surgeon: [name], MD;  Location: [facility];  Service: General;  Laterality: N/A;  . Port-a-cath removal  04/17/2011    Procedure: REMOVAL PORT-A-CATH;  Surgeon: [name], MD;  Location: [facility];  Service: General;  Laterality: Left;  . Hysteroscopy w/d&c N/A 09/11/2012    Procedure: DILATATION AND CURETTAGE ;  Surgeon: [name], MD;  Location: [facility];  Service: Gynecology;  Laterality: N/A;  . Esophagogastroduodenoscopy N/A 02/09/2013    Procedure: ESOPHAGOGASTRODUODENOSCOPY (EGD);  Surgeon: [name], MD;  Location: [name];  Service: Endoscopy;  Laterality: N/A;  . Colonoscopy N/A 02/09/2013    Procedure: COLONOSCOPY;  Surgeon: [name], MD;  Location: [facility];  Service: Endoscopy;  Laterality: N/A;    Family History  Problem Relation Age of Onset  . Cancer Son   . Throat cancer Father   . Lymphoma Brother 22  . Colon cancer Neg Hx   . Heart disease Mother   . Alcoholism Father   . Heart attack Father   . Heart attack Brother   . Diabetes Brother    Social History:  reports that she quit smoking about 16 years ago. She has never used smokeless tobacco. She reports that she drinks about 3.6 oz of alcohol per week. She reports that she does not use illicit drugs.  Allergies:  Allergies  Allergen Reactions  . Codeine Nausea And Vomiting and Other (See Comments)    Severe stomach cramps  . Antihistamines, Diphenhydramine-Type Other (See Comments)    Causes hyperactivity  . Statins Other (See Comments)    Joint pains  . Erythromycin Hives and Rash    Due to dental work about 50 years ago. Was used in packing and resulted in rash/hives inside and outside of mouth.     (Not in a hospital admission)  No results found for this or any previous visit (from the past 48 hour(s)). No results found.  Review of Systems  Constitutional: Negative.    HENT: Negative.   Eyes: Negative.   Respiratory: Negative.   Cardiovascular: Negative.   Gastrointestinal: Negative.   Genitourinary: Negative.   Musculoskeletal: Positive for back pain.  Skin: Negative.   Neurological: Positive for sensory change and focal weakness.  Psychiatric/Behavioral: Negative.     There were no vitals taken for this visit. Physical Exam  Constitutional: She is oriented to person, place, and time. She appears well-developed and well-nourished.  HENT:  Head: Normocephalic.  Eyes: Pupils are equal, round, and reactive to light.  Neck: Normal range of motion.  Cardiovascular: Normal rate.   Respiratory: Effort normal.  GI: Soft.  Musculoskeletal:  She is upright, in mild distress. She walks with an antalgic  gait, forward flexed. Straight leg raise, low back and buttock pain. She has mild quad weakness. She does not have a sensory deficit in a stocking glove distribution. She has pain with resisted extension, relieved with forward flexion and sitting.  Lumbar spine exam reveals no evidence of soft tissue swelling, deformity or skin ecchymosis. On palpation there is no tenderness of the lumbar spine. No flank pain with percussion. The abdomen is soft and nontender. Nontender over the trochanters. No cellulitis or lymphadenopathy.  Motor is 5/5 including EHL, tibialis anterior, plantar flexion, hamstrings. Patient is normoreflexic. There is no Babinski or clonus. Sensory exam is intact to light touch. No DVT. No pain and normal range of motion without instability of the hips, knees and ankles. Trace pulses in the left lower extremity, dorsalis pedis, and posterior tibial.  Neurological: She is alert and oriented to person, place, and time.  Skin: Skin is warm.   Three view radiographs, AP, lateral, flexion-extension radiographs of lumbar spine demonstrate dextrorotary thoracolumbar scoliosis, underlying osteopenia. There is calcification of the aortic  vessels.  MRI demonstrates severe stenosis at L4-5, moderate L3-4. Mild lateral recess stenosis at L5-S1, mild spinal stenosis at L2-3.  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 Minimal mechanical back pain secondary to acquired and degenerative scoliosis without instability in flexion extension, minimal back pain. 3. Underlying osteopenia. 4. Peripheral vascular disease with possible diminished flow in the left lower extremity. 5. Non-insulin dependent diabetes without evidence of neuropathy.  Extensive discussion with Shaynna concerning current pathology, relevant anatomy, and treatment options. She does have symptoms consistent with neurogenic claudication secondary to severe spinal stenosis L4-5 secondarily at L3-4. Discussed her activity modification strategies avoiding reinjury which she has been deploying. She walks 10 to 15 feet before she has symptoms which require her to sit which is significant claudication. She can get relief and then continue forward. She does have a history of coronary stent. She does take 81 mg aspirin. She does have noninsulin dependent diabetes. We did discuss epidural steroid injection. She has declined inciting family members who have had them which were temporary and were not efficacious. I discussed other options living with her symptoms which she does not want to continue to live with them. The other option discussed is lumbar decompression if medical clearance can be obtained. I would recommend a central decompression at L4-5 to include a central laminectomy at L4 to decompress a portion of L3-4 as well.  I had an extensive discussion of the risks and benefits of the lumbar decompression with the patient including bleeding, infection, damage to neurovascular structures, epidural fibrosis, CSF leak requiring repair. We also discussed increase in pain, adjacent  segment disease, recurrent disc herniation, need for future surgery including repeat decompression and/or fusion. We also discussed risks of postoperative hematoma, paralysis, anesthetic complications including DVT, PE, death, cardiopulmonary dysfunction. In addition, the perioperative and postoperative courses were discussed in detail including the rehabilitative time and return to functional activity and work. I provided the patient with an illustrated handout and utilized the appropriate surgical models.  We discussed specifically we will obtain an arterial doppler preoperatively to assess for adequate lower extremity flow, given her history of peripheral vascular disease. We will obtain preoperative clearance by her medical physician, [name] at [facility] and Dr. [name]. She is not currently a smoker, has no history DVT or PE. We discussed current study concerning continuing  with low-dose aspirin perioperatively and the sequelae postoperatively in terms of managing perioperative risk. In the interim, avoid extension, favor flexion. If she would like to obtain an epidural after this discussion or like to proceed with that, we will have her sent to Dr. Nelva Bush. No history of MRSA that is noted and again her blood glucoses are well controlled at this point in time. I appreciate the kind referral and the meticulous workup and diagnosis by Dr. Delilah Shan.  Plan microlumbar decompression L3-4, L4-5  BISSELL, JACLYN M. PA-C for Dr. Tonita Cong 10/08/2014, 3:44 PM

## 2014-10-20 NOTE — Brief Op Note (Signed)
10/20/2014  9:09 AM  PATIENT:  [name]  74 y.o. female  PRE-OPERATIVE DIAGNOSIS:  STENOSIS L3-4,L4-5  POST-OPERATIVE DIAGNOSIS:  STENOSIS L3-4,L4-5  PROCEDURE:  Procedure(s): MICRO LUMBER DECOMPRESSION L3-4 L4-5 (N/A)  SURGEON:  Surgeon(s) and Role:    * [name], MD - Primary  PHYSICIAN ASSISTANT:   ASSISTANTS: Bissell   ANESTHESIA:   spinal  EBL:  Total I/O In: 1000 [I.V.:1000] Out: 350 [Urine:350]  BLOOD ADMINISTERED:none  DRAINS: none   LOCAL MEDICATIONS USED:  MARCAINE     SPECIMEN:  No Specimensynovial cyst.  DISPOSITION OF SPECIMEN:  PATHOLOGY  COUNTS:  YES  TOURNIQUET:  * No tourniquets in log *  DICTATION: .Other Dictation: Dictation Number [id number]  PLAN OF CARE: Admit for overnight observation  PATIENT DISPOSITION:  PACU - hemodynamically stable.   Delay start of Pharmacological VTE agent (>24hrs) due to surgical blood loss or risk of bleeding: yes

## 2014-10-20 NOTE — Anesthesia Procedure Notes (Signed)
Procedure Name: Intubation Date/Time: 10/20/2014 7:36 AM Performed by: Dimas Millin, AMY F Pre-anesthesia Checklist: Patient identified, Emergency Drugs available, Suction available, Patient being monitored and Timeout performed Patient Re-evaluated:Patient Re-evaluated prior to inductionOxygen Delivery Method: Circle system utilized Preoxygenation: Pre-oxygenation with 100% oxygen Intubation Type: IV induction Ventilation: Mask ventilation without difficulty Laryngoscope Size: Miller and 2 Grade View: Grade I Tube type: Oral Tube size: 7.0 mm Number of attempts: 1 Airway Equipment and Method: Stylet Placement Confirmation: ETT inserted through vocal cords under direct vision,  positive ETCO2 and breath sounds checked- equal and bilateral Secured at: 22 cm Tube secured with: Tape Dental Injury: Teeth and Oropharynx as per pre-operative assessment

## 2014-10-20 NOTE — Interval H&P Note (Signed)
History and Physical Interval Note:  10/20/2014 7:25 AM  [name]  has presented today for surgery, with the diagnosis of STENOSIS L3-4,L4-5  The various methods of treatment have been discussed with the patient and family. After consideration of risks, benefits and other options for treatment, the patient has consented to  Procedure(s): MICRO LUMBER DECOMPRESSION L3-4 L4-5 (N/A) as a surgical intervention .  The patient's history has been reviewed, patient examined, no change in status, stable for surgery.  I have reviewed the patient's chart and labs.  Questions were answered to the patient's satisfaction.     BEANE,JEFFREY C

## 2014-10-20 NOTE — Discharge Instructions (Signed)

## 2014-10-21 DIAGNOSIS — M4806 Spinal stenosis, lumbar region: Secondary | ICD-10-CM | POA: Diagnosis not present

## 2014-10-21 LAB — CBC
HCT: 33.8 % — ABNORMAL LOW (ref 36.0–46.0)
Hemoglobin: 11.3 g/dL — ABNORMAL LOW (ref 12.0–15.0)
MCH: 29.8 pg (ref 26.0–34.0)
MCHC: 33.4 g/dL (ref 30.0–36.0)
MCV: 89.2 fL (ref 78.0–100.0)
PLATELETS: 174 10*3/uL (ref 150–400)
RBC: 3.79 MIL/uL — AB (ref 3.87–5.11)
RDW: 12.9 % (ref 11.5–15.5)
WBC: 11.8 10*3/uL — ABNORMAL HIGH (ref 4.0–10.5)

## 2014-10-21 LAB — GLUCOSE, CAPILLARY
GLUCOSE-CAPILLARY: 311 mg/dL — AB (ref 65–99)
GLUCOSE-CAPILLARY: 268 mg/dL — AB (ref 65–99)
GLUCOSE-CAPILLARY: 339 mg/dL — AB (ref 65–99)
Glucose-Capillary: 271 mg/dL — ABNORMAL HIGH (ref 65–99)

## 2014-10-21 LAB — BASIC METABOLIC PANEL
Anion gap: 9 (ref 5–15)
BUN: 14 mg/dL (ref 6–20)
CALCIUM: 8 mg/dL — AB (ref 8.9–10.3)
CO2: 24 mmol/L (ref 22–32)
CREATININE: 1.2 mg/dL — AB (ref 0.44–1.00)
Chloride: 98 mmol/L — ABNORMAL LOW (ref 101–111)
GFR calc non Af Amer: 44 mL/min — ABNORMAL LOW (ref >60)
GFR, EST AFRICAN AMERICAN: 51 mL/min — AB (ref >60)
Glucose, Bld: 287 mg/dL — ABNORMAL HIGH (ref 65–99)
Potassium: 4.8 mmol/L (ref 3.5–5.1)
SODIUM: 131 mmol/L — AB (ref 135–145)

## 2014-10-21 MED ORDER — OXYCODONE-ACETAMINOPHEN 7.5-325 MG PO TABS
1.0000 | ORAL_TABLET | ORAL | Status: DC | PRN
  Administered 2014-10-21 – 2014-10-22 (×4): 2 via ORAL
  Filled 2014-10-21 (×4): qty 2

## 2014-10-21 MED ORDER — ASPIRIN 81 MG PO TABS
81.0000 mg | ORAL_TABLET | Freq: Every day | ORAL | Status: DC
Start: 2014-10-21 — End: 2015-10-13

## 2014-10-21 MED ORDER — INSULIN ASPART 100 UNIT/ML ~~LOC~~ SOLN
3.0000 [IU] | Freq: Once | SUBCUTANEOUS | Status: AC
  Administered 2014-10-21: 3 [IU] via SUBCUTANEOUS

## 2014-10-21 MED ORDER — IBUPROFEN 200 MG PO TABS: 600.0000 mg | ORAL_TABLET | Freq: Four times a day (QID) | ORAL | Status: DC | PRN

## 2014-10-21 MED ORDER — OXYCODONE-ACETAMINOPHEN 5-325 MG PO TABS
1.0000 | ORAL_TABLET | ORAL | Status: DC | PRN
  Administered 2014-10-21: 2 via ORAL
  Filled 2014-10-21: qty 2

## 2014-10-21 MED ORDER — OXYCODONE-ACETAMINOPHEN 5-325 MG PO TABS: 1.0000 | ORAL_TABLET | Freq: Four times a day (QID) | ORAL | Status: DC | PRN

## 2014-10-21 NOTE — Progress Notes (Signed)
Subjective: 1 Day Post-Op Procedure(s) (LRB): MICRO LUMBER DECOMPRESSION L3-4 L4-5 (N/A) Patient reports pain as 3 on 0-10 scale.   Back pain Objective: Vital signs in last 24 hours: Temp:  [97.6 F (36.4 C)-98.7 F (37.1 C)] 98.6 F (37 C) (09/29 0736) Pulse Rate:  [77-111] 111 (09/29 0736) Resp:  [12-20] 16 (09/29 0736) BP: (109-172)/(54-82) 133/60 mmHg (09/29 0736) SpO2:  [98 %-100 %] 100 % (09/29 0736) Weight:  [68.947 kg (152 lb)] 68.947 kg (152 lb) (09/28 1052)  Intake/Output from previous day: 09/28 0701 - 09/29 0700 In: 3675.8 [P.O.:1020; I.V.:2500.8; IV Piggyback:155] Out: 3350 [Urine:3350] Intake/Output this shift: Total I/O In: 120 [P.O.:120] Out: -    Recent Labs  10/21/14 0518  HGB 11.3*    Recent Labs  10/21/14 0518  WBC 11.8*  RBC 3.79*  HCT 33.8*  PLT 174    Recent Labs  10/21/14 0518  NA 131*  K 4.8  CL 98*  CO2 24  BUN 14  CREATININE 1.20*  GLUCOSE 287*  CALCIUM 8.0*   No results for input(s): LABPT, INR in the last 72 hours.  Neurologically intact Neurovascular intact Sensation intact distally Dorsiflexion/Plantar flexion intact Incision: dressing C/D/I  Assessment/Plan: 1 Day Post-Op Procedure(s) (LRB): MICRO LUMBER DECOMPRESSION L3-4 L4-5 (N/A) Advance diet Up with therapy Discharge home with home health possible today  BEANE,JEFFREY C 10/21/2014, 9:19 AM

## 2014-10-21 NOTE — Care Management Note (Signed)
Case Management Note  Patient Details  Name: [name] MRN: [id number] Date of Birth: 1940/05/10  Subjective/Objective:                   MICRO LUMBER DECOMPRESSION L3-4 L4-5 (N/A) Action/Plan:  Discharge planning Expected Discharge Date:                  Expected Discharge Plan:  [place]  In-House Referral:     Discharge planning Services  CM Consult  Post Acute Care Choice:  Home Health Choice offered to:  Patient  DME Arranged:  N/A DME Agency:     HH Arranged:  PT [place]:  [place]  Status of Service:  Completed, signed off  Medicare Important Message Given:    Date Medicare IM Given:    Medicare IM give by:    Date Additional Medicare IM Given:    Additional Medicare Important Message give by:     If discussed at [place] of Stay Meetings, dates discussed:    Additional Comments: CM met with pt in room to offer choice of home health agency.  Pt chooses [facility] to render HHPT.  Pt states she has both a rolling walker and 3n1 at home.  Address and contact information verified by pt.  Referral called to [facility] rep, Kristen.  Orders and face to face have been requested.  No other CM needs were communicated. Dellie Catholic, RN 10/21/2014, 3:30 PM

## 2014-10-21 NOTE — Evaluation (Signed)
Occupational Therapy Evaluation Patient Details Name: [name] MRN: [id number] DOB: 07/27/1940 Today's Date: 10/21/2014    History of Present Illness 74 yo female s/p MICRO LUMBER DECOMPRESSION L3-4 L4-5. PMH: GERD, thyroid disease, breast cancer, depression   Clinical Impression   Patient presenting with decreased ADL and functional mobility independence secondary to above. Patient independent PTA. Patient currently functioning at an overall min>mod assist level with ADLs and functional mobility/transfers. Patient will benefit from acute OT to increase overall independence in the areas of ADLs, functional mobility, education on back precautions, and overall safety in order to safely discharge home with husband.   Pt found in bed with 9/10 complaints of pain. Pt willing to work with therapist. RN present in room to administer pain medications. When sitting EOB, pt with 5/10 complaints of pain, pain decreasing with movement/acitivty. Pt given option to do what she felt like she could, get in recliner or go back to bed; pt requested to get up and sit in recliner. Once in recliner, positioned patient with BLEs elevated with pillow under legs and left UE elevated for comfort.   Limited OT evaluation secondary to increased pain, would like to see patient for a second session tomorrow prior to her discharging>home.     Follow Up Recommendations  No OT follow up;Supervision/Assistance - 24 hour    Equipment Recommendations  Other (comment) (TBD)    Recommendations for Other Services  None at this time   Precautions / Restrictions Precautions Precautions: Fall;Back (high fall risk) Precaution Comments: Administerred back precaution handout Restrictions Weight Bearing Restrictions: No    Mobility Bed Mobility Overal bed mobility: Needs Assistance Bed Mobility: Rolling;Sidelying to Sit Rolling: Min assist Sidelying to sit: Mod assist;HOB elevated General bed mobility comments: Mod  assist for sidelying to sit for BLE management and trunk support secondary to pain. Minimal use of bed rails. Cues for technique, sequencing, and back precautions.   Transfers Overall transfer level: Needs assistance Equipment used: Rolling walker (2 wheeled) Transfers: Sit to/from [facility] Sit to Stand: Min assist Stand pivot transfers: Min assist General transfer comment: Standing from elevated bed. Cues for hand placement, technique, sequencing, back precautions.     Balance Overall balance assessment: Needs assistance Sitting-balance support: No upper extremity supported;Feet supported Sitting balance-Leahy Scale: Fair Sitting balance - Comments: secondary to increased pain   Standing balance support: Bilateral upper extremity supported;During functional activity Standing balance-Leahy Scale: Fair Standing balance comment: secondary to increased pain    ADL Overall ADL's : Needs assistance/impaired General ADL Comments: Limited OT evaluation secondary to pain. Pt will benefit from education on AE and DME prior to discharge > home. Plan to educate pt on toilet transfers and tub/shower transfers during next OT session, probably tomorrow. Pt did engage in bed mobility and transfer EOB>recliner. Adminsterred back precaution handout for bed mobility and ADLs.     Pertinent Vitals/Pain Pain Assessment: 0-10 Pain Score: 9  Pain Location: neck and back Pain Descriptors / Indicators: Aching Pain Intervention(s): Limited activity within patient's tolerance;Monitored during session;Repositioned;RN gave pain meds during session     Hand Dominance Right   Extremity/Trunk Assessment Upper Extremity Assessment Upper Extremity Assessment: Generalized weakness   Lower Extremity Assessment Lower Extremity Assessment: Defer to PT evaluation   Cervical / Trunk Assessment Cervical / Trunk Assessment: Normal   Communication Communication Communication: No difficulties    Cognition Arousal/Alertness: Awake/alert Behavior During Therapy: WFL for tasks assessed/performed Overall Cognitive Status: Within Functional Limits for tasks assessed  Home Living Family/patient expects to be discharged to:: Private residence Living Arrangements: Spouse/significant other Available Help at Discharge: Family;Available 24 hours/day Type of Home: House Home Access: Stairs to enter [employer] of Steps: 1- front entrance, 5-back entrance Entrance Stairs-Rails: Right;Left (back entrance) Home Layout: One level     Bathroom Shower/Tub: Tub/shower unit;Curtain   Bathroom Toilet: Handicapped height     Home Equipment: Environmental consultant - 2 wheels;Cane - single point;Grab bars - tub/shower;Bedside commode;Shower seat   Additional Comments: Pt reports all above AE/DME was her mothers       Prior Functioning/Environment Level of Independence: Independent     OT Diagnosis: Generalized weakness;Acute pain   OT Problem List: Decreased strength;Decreased activity tolerance;Impaired balance (sitting and/or standing);Decreased safety awareness;Decreased knowledge of use of DME or AE;Decreased knowledge of precautions;Pain   OT Treatment/Interventions: Self-care/ADL training;Therapeutic exercise;Energy conservation;DME and/or AE instruction;Therapeutic activities;Patient/family education;Balance training    OT Goals(Current goals can be found in the care plan section) Acute Rehab OT Goals Patient Stated Goal: decrease pain OT Goal Formulation: With patient/family Time For Goal Achievement: 11/04/14 Potential to Achieve Goals: Good ADL Goals Pt Will Perform Grooming: with modified independence;standing Pt Will Perform Lower Body Bathing: with modified independence;sit to/from stand (using AE prn) Pt Will Perform Lower Body Dressing: with modified independence;sit to/from stand (using AE prn) Pt Will Transfer to Toilet: with modified  independence;ambulating;bedside commode Pt Will Perform Tub/Shower Transfer: Tub transfer;shower seat;rolling walker;with modified independence;ambulating Additional ADL Goal #1: Pt will be mod I with functional mobility and transfers using RW prn  OT Frequency: Min 2X/week   Barriers to D/C: None known at this time   End of Session Equipment Utilized During Treatment: Gait belt;Rolling walker Nurse Communication: Mobility status  Activity Tolerance: Patient limited by pain Patient left: in chair;with call bell/phone within reach   Time: 0905-0931 OT Time Calculation (min): 26 min Charges:  OT General Charges $OT Visit: 1 Procedure OT Evaluation $Initial OT Evaluation Tier I: 1 Procedure OT Treatments $Self Care/Home Management : 8-22 mins G-Codes: OT G-codes **NOT FOR INPATIENT CLASS** Functional Limitation: Self care Self Care Current Status [id number]): At least 40 percent but less than 60 percent impaired, limited or restricted Self Care Goal Status [id number]): At least 1 percent but less than 20 percent impaired, limited or restricted  CLAY,[name] , MS, OTR/L, CLT Pager: [phone]  10/21/2014, 9:44 AM

## 2014-10-21 NOTE — Evaluation (Signed)
Physical Therapy Evaluation Patient Details Name: [name] MRN: [id number] DOB: 1940-04-02 Today's Date: 10/21/2014   History of Present Illness  74 yo female s/p MICRO LUMBER DECOMPRESSION L3-4 L4-5. PMH: GERD, thyroid disease, breast cancer, depression  Clinical Impression  Patient is s/p above surgery resulting in the deficits listed below (see PT Problem List).  Patient will benefit from skilled PT to increase their independence and safety with mobility (while adhering to their precautions) to allow discharge to the venue listed below.   Pt only able to tolerate assist to/from [facility] at time of evaluation.  Pt with increased back pain and RN into room with pain meds end of session.     Follow Up Recommendations Home health PT;Supervision for mobility/OOB    Equipment Recommendations  Rolling walker with 5" wheels (if RW at home is not pt's size)    Recommendations for Other Services       Precautions / Restrictions Precautions Precautions: Back;Fall Precaution Comments: Administerred back precaution handout Restrictions Weight Bearing Restrictions: No      Mobility  Bed Mobility Overal bed mobility: Needs Assistance Bed Mobility: Rolling;Sidelying to Sit Rolling: Min assist Sidelying to sit: Mod assist;HOB elevated       General bed mobility comments: pt up in recliner on arrival  Transfers Overall transfer level: Needs assistance Equipment used: Rolling walker (2 wheeled) Transfers: Sit to/from Stand Sit to Stand: Min assist Stand pivot transfers: Min assist       General transfer comment: verbal cues for hand placement and maintaining back precautions, assist for rise and steady, assisted with transfer to [facility], attempted a few steps after BSC however pt with dizziness, perspiration so assisted to recliner, BP 113/63 mmHg HR 86bpm  Ambulation/Gait                Stairs            Wheelchair Mobility    Modified Rankin (Stroke Patients  Only)       Balance Overall balance assessment: Needs assistance Sitting-balance support: No upper extremity supported;Feet supported Sitting balance-Leahy Scale: Fair Sitting balance - Comments: secondary to increased pain   Standing balance support: Bilateral upper extremity supported Standing balance-Leahy Scale: Poor Standing balance comment: requires RW                             Pertinent Vitals/Pain Pain Assessment: 0-10 Pain Score: 8  Pain Location: back Pain Descriptors / Indicators: Aching;Sore Pain Intervention(s): Limited activity within patient's tolerance;Monitored during session;Repositioned;RN gave pain meds during session (RN into room end of session with morphine)    Home Living Family/patient expects to be discharged to:: Private residence Living Arrangements: Spouse/significant other Available Help at Discharge: Family;Available 24 hours/day Type of Home: House Home Access: Stairs to enter Entrance Stairs-Rails: Right;Left (back entrance) Entrance Stairs-Number of Steps: 1- front entrance, 5-back entrance Home Layout: One level Home Equipment: Walker - 2 wheels;Cane - single point;Grab bars - tub/shower;Bedside commode;Shower seat Additional Comments: Pt reports all above AE/DME was her mothers     Prior Function Level of Independence: Independent               Hand Dominance   Dominant Hand: Right    Extremity/Trunk Assessment   Upper Extremity Assessment: Generalized weakness           Lower Extremity Assessment: Generalized weakness      Cervical / Trunk Assessment: Normal  Communication   Communication:  No difficulties  Cognition Arousal/Alertness: Awake/alert Behavior During Therapy: WFL for tasks assessed/performed Overall Cognitive Status: Within Functional Limits for tasks assessed                      General Comments      Exercises        Assessment/Plan    PT Assessment Patient needs  continued PT services  PT Diagnosis Difficulty walking;Acute pain   PT Problem List Decreased strength;Decreased activity tolerance;Decreased mobility;Decreased balance;Decreased knowledge of use of DME;Pain;Decreased knowledge of precautions  PT Treatment Interventions Gait training;DME instruction;Functional mobility training;Stair training;Patient/family education;Therapeutic activities;Therapeutic exercise   PT Goals (Current goals can be found in the Care Plan section) Acute Rehab PT Goals Patient Stated Goal: decrease pain PT Goal Formulation: With patient Time For Goal Achievement: 10/25/14 Potential to Achieve Goals: Good    Frequency Min 5X/week   Barriers to discharge        Co-evaluation               End of Session   Activity Tolerance: Patient limited by pain Patient left: in chair;with call bell/phone within reach;with nursing/sitter in room;with family/visitor present      Functional Assessment Tool Used: clinical judgement Functional Limitation: Mobility: Walking and moving around Mobility: Walking and Moving Around Current Status [id number]): At least 20 percent but less than 40 percent impaired, limited or restricted Mobility: Walking and Moving Around Goal Status [phone]): At least 1 percent but less than 20 percent impaired, limited or restricted    Time: 1017-1035 PT Time Calculation (min) (ACUTE ONLY): 18 min   Charges:   PT Evaluation $Initial PT Evaluation Tier I: 1 Procedure     PT G Codes:   PT G-Codes **NOT FOR INPATIENT CLASS** Functional Assessment Tool Used: clinical judgement Functional Limitation: Mobility: Walking and moving around Mobility: Walking and Moving Around Current Status [id number]): At least 20 percent but less than 40 percent impaired, limited or restricted Mobility: Walking and Moving Around Goal Status [id number]): At least 1 percent but less than 20 percent impaired, limited or restricted    LEMYRE,KATHrine E 10/21/2014, 12:36  PM Carmelia Bake, PT, DPT 10/21/2014 Pager: [phone]

## 2014-10-22 DIAGNOSIS — M4806 Spinal stenosis, lumbar region: Secondary | ICD-10-CM | POA: Diagnosis not present

## 2014-10-22 LAB — GLUCOSE, CAPILLARY
GLUCOSE-CAPILLARY: 331 mg/dL — AB (ref 65–99)
Glucose-Capillary: 269 mg/dL — ABNORMAL HIGH (ref 65–99)

## 2014-10-22 MED ORDER — OXYCODONE-ACETAMINOPHEN 7.5-325 MG PO TABS: 1.0000 | ORAL_TABLET | ORAL | Status: DC | PRN

## 2014-10-22 NOTE — Progress Notes (Signed)
Subjective: 2 Days Post-Op Procedure(s) (LRB): MICRO LUMBER DECOMPRESSION L3-4 L4-5 (N/A) Patient reports pain as moderate.   Reports pain better controlled with 2 Percocet 7.5mg  which we switched to yesterday. Still reporting back and B/L leg pain, has not been OOB much due to pain. Reports no difficulty with voiding. Does feel she is able to D/C later today  Objective: Vital signs in last 24 hours: Temp:  [97.5 F (36.4 C)-98.6 F (37 C)] 98.1 F (36.7 C) (09/30 0606) Pulse Rate:  [96-109] 98 (09/30 0606) Resp:  [15-18] 16 (09/30 0606) BP: (120-140)/(48-67) 123/67 mmHg (09/30 0606) SpO2:  [96 %-98 %] 97 % (09/30 0606)  Intake/Output from previous day: 09/29 0701 - 09/30 0700 In: 1200 [P.O.:1200] Out: 2150 [Urine:2150] Intake/Output this shift:     Recent Labs  10/21/14 0518  HGB 11.3*    Recent Labs  10/21/14 0518  WBC 11.8*  RBC 3.79*  HCT 33.8*  PLT 174    Recent Labs  10/21/14 0518  NA 131*  K 4.8  CL 98*  CO2 24  BUN 14  CREATININE 1.20*  GLUCOSE 287*  CALCIUM 8.0*   No results for input(s): LABPT, INR in the last 72 hours.  Neurologically intact ABD soft Neurovascular intact Sensation intact distally Intact pulses distally Dorsiflexion/Plantar flexion intact Incision: dressing C/D/I and no drainage No cellulitis present Compartment soft no sign of DVT  Assessment/Plan: 2 Days Post-Op Procedure(s) (LRB): MICRO LUMBER DECOMPRESSION L3-4 L4-5 (N/A) Advance diet Up with therapy D/C IV fluids  Discussed D/C instructions, dressing instructions, Lspine precautions Plan to D/C home later today after more PT today Updated D/C meds Will follow up in 2 weeks for staple removal with Dr. Tonita Cong Will discuss with Dr. Mliss Fritz, [place] Rolls. 10/22/2014, 8:18 AM

## 2014-10-22 NOTE — Discharge Summary (Signed)
Physician Discharge Summary   Patient ID: [name] MRN: [id number] DOB/AGE: November 19, 1940 74 y.o.  Admit date: 10/20/2014 Discharge date: 10/22/2014  Primary Diagnosis:   STENOSIS L3-4,L4-5  Admission Diagnoses:  Past Medical History  Diagnosis Date  . Elevated cholesterol   . GERD (gastroesophageal reflux disease)   . Thyroid disease   . Hypertension   . Coronary artery disease     stent 2007  . Depression   . Diabetes mellitus   . Hypothyroidism   . 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 Arthritis   . Carcinoma of breast treated with adjuvant chemotherapy   . Heart murmur     secondary to chem. 2013  . Anemia     childhood  . Osteoarthritis   . HSV-1 (herpes simplex virus 1) infection   . Hyperlipidemia   . Anxiety   . Diverticulosis 12/10/03  . 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 Cancer   . Pneumonia     2014 september  . Arterial stenosis     mesenteric  . Difficulty sleeping   . Spinal stenosis    Discharge Diagnoses:   Principal Problem:   Spinal stenosis of lumbar region  Procedure:  Procedure(s) (LRB): MICRO LUMBER DECOMPRESSION L3-4 L4-5 (N/A)   Consults: None  HPI:  see H&P    Laboratory Data: Hospital Outpatient Visit on 10/12/2014  Component Date Value Ref Range Status  . MRSA, PCR 10/12/2014 NEGATIVE  NEGATIVE Final  . Staphylococcus aureus 10/12/2014 POSITIVE* NEGATIVE Final   Comment:        The Xpert SA Assay (FDA approved for NASAL specimens in patients over 79 years of age), is one component of a comprehensive surveillance program.  Test performance has been validated by [facility] for patients greater than or equal to 73 year old. It is not intended to diagnose infection nor to guide or monitor treatment.   . Sodium 10/12/2014 142  135 - 145 mmol/L Final  . Potassium 10/12/2014 5.3* 3.5 - 5.1 mmol/L Final  . Chloride 10/12/2014 105  101 - 111 mmol/L Final  .  CO2 10/12/2014 26  22 - 32 mmol/L Final  . Glucose, Bld 10/12/2014 201* 65 - 99 mg/dL Final  . BUN 10/12/2014 18  6 - 20 mg/dL Final  . Creatinine, Ser 10/12/2014 1.27* 0.44 - 1.00 mg/dL Final  . Calcium 10/12/2014 8.8* 8.9 - 10.3 mg/dL Final  . GFR calc non Af Amer 10/12/2014 41* >60 mL/min Final  . GFR calc Af Amer 10/12/2014 48* >60 mL/min Final   Comment: (NOTE) The eGFR has been calculated using the CKD EPI equation. This calculation has not been validated in all clinical situations. eGFR's persistently <60 mL/min signify possible Chronic Kidney Disease.   . Anion gap 10/12/2014 11  5 - 15 Final  . WBC 10/12/2014 7.0  4.0 - 10.5 K/uL Final  . RBC 10/12/2014 4.04  3.87 - 5.11 MIL/uL Final  . Hemoglobin 10/12/2014 12.3  12.0 - 15.0 g/dL Final  . HCT 10/12/2014 37.1  36.0 - 46.0 % Final  . MCV 10/12/2014 91.8  78.0 - 100.0 fL Final  . MCH 10/12/2014 30.4  26.0 - 34.0 pg Final  . MCHC 10/12/2014 33.2  30.0 - 36.0 g/dL Final  . RDW 10/12/2014 13.1  11.5 - 15.5 % Final  . Platelets 10/12/2014 230  150 - 400 K/uL Final  . ABO/RH(D) 10/12/2014 O  NEG   Final  . Antibody Screen 10/12/2014 NEG   Final  . Sample Expiration 10/12/2014 10/23/2014   Final  . ABO/RH(D) 10/12/2014 O NEG   Final    Recent Labs  10/21/14 0518  HGB 11.3*    Recent Labs  10/21/14 0518  WBC 11.8*  RBC 3.79*  HCT 33.8*  PLT 174    Recent Labs  10/21/14 0518  NA 131*  K 4.8  CL 98*  CO2 24  BUN 14  CREATININE 1.20*  GLUCOSE 287*  CALCIUM 8.0*   No results for input(s): LABPT, INR in the last 72 hours.  X-Rays:Dg Lumbar Spine 2-3 Views  10/12/2014   CLINICAL DATA:  Preop evaluation prior to the L3-4 and L4-5 lumbar decompression. Lumbar spinal stenosis.  EXAM: LUMBAR SPINE - 2-3 VIEW  COMPARISON:  Abdomen and pelvis CT dated 02/13/2013.  FINDINGS: Five non-rib-bearing lumbar vertebrae. Mild dextroconvex thoracolumbar rotary scoliosis. Mild to moderate anterior and lateral spur formation at  multiple levels of the lumbar and lower thoracic spine. Lower lumbar spine facet degenerative changes. No pars defects or subluxations. Atheromatous arterial calcifications. Cholecystectomy clips.  IMPRESSION: Scoliosis and degenerative changes, as described above.   Electronically Signed   By: Claudie Revering M.D.   On: 10/12/2014 10:44   US Arterial Seg Multiple  10/01/2014   CLINICAL DATA:  74 year old female former smoker with history of peripheral vascular disease  EXAM: NONINVASIVE PHYSIOLOGIC VASCULAR STUDY OF BILATERAL LOWER EXTREMITIES  TECHNIQUE: Evaluation of both lower extremities was performed at rest, including calculation of ankle-brachial indices, multiple segmental pressure evaluation, segmental Doppler and segmental pulse volume recording.  COMPARISON:  None.  FINDINGS: Right ABI:  1.1  Left ABI:  1.2  Right Lower Extremity: Normal triphasic arterial waveforms, segmental pressures and pulse volume recordings.  Left Lower Extremity: Normal triphasic arterial waveforms, segmental pressures and pulse volume recordings.  IMPRESSION: Normal examination. No evidence of significant peripheral arterial disease.  Signed,  Criselda Peaches, MD  [facility]  [facility]   Electronically Signed   By: Jacqulynn Cadet M.D.   On: 10/01/2014 12:08   Dg Spine Portable 1 View  10/20/2014   CLINICAL DATA:  Lumbar disc disease.  EXAM: PORTABLE SPINE - 1 VIEW  COMPARISON:  Radiographs dated 10/20/2014 and 10/12/2014  FINDINGS: Image 3 demonstrates an instrument at the L3-4 level and an instrument at the level of the pedicles of L5.  IMPRESSION: Instruments at L3-4 and at L5.   Electronically Signed   By: Lorriane Shire M.D.   On: 10/20/2014 08:53   Dg Spine Portable 1 View  10/20/2014   CLINICAL DATA:  Patient for lower lumbar surgery. Intraoperative localization film.  EXAM: PORTABLE SPINE - 1 VIEW  COMPARISON:  Intraoperative localization film earlier this same  day.  FINDINGS: Spinous process clamp is in place and is mainly centered on the L4 spinous process although a portion of the clamp appears attached to the L3 spinous process. Two metallic probes are identified with one at the level of the L3 inferior endplate and a second at the level the superior aspect of the L5 pedicles.  IMPRESSION: Localization as above.   Electronically Signed   By: Inge Rise M.D.   On: 10/20/2014 08:20   Dg Spine Portable 1 View  10/20/2014   CLINICAL DATA:  Patient for L3-5 lumbar surgery. Intraoperative localization film.  EXAM: PORTABLE SPINE - 1 VIEW  COMPARISON:  Plain films lumbar spine 10/12/2014.  FINDINGS: Two metallic probes are in place. The more superior is at the level of the L3 spinous process. Second, more inferior probe is directed toward the space between the L4 and L5 spinous processes.  IMPRESSION: Localization as above.   Electronically Signed   By: Inge Rise M.D.   On: 10/20/2014 08:07    EKG: Orders placed or performed during the hospital encounter of 10/12/14  . EKG 12-Lead  . EKG 12-Lead     Hospital Course: Patient was admitted to [facility] and taken to the OR and underwent the above state procedure without complications.  Patient tolerated the procedure well and was later transferred to the recovery room and then to the orthopaedic floor for postoperative care.  They were given PO and IV analgesics for pain control following their surgery.  They were given 24 hours of postoperative antibiotics.   PT was consulted postop to assist with mobility and transfers.  The patient was allowed to be WBAT with therapy and was taught back precautions. Discharge planning was consulted to help with postop disposition and equipment needs.  Patient had a difficult night on the evening of surgery and started to get up OOB with therapy on day one. She had a difficult time with pain control on POD #1 which improved later in the day and into POD #2.  Patient was seen in rounds and was ready to go home on day two post-op.  They were given discharge instructions and dressing directions.  They were instructed on when to follow up in the office with Dr. Tonita Cong.   Diet: Diabetic diet Activity:WBAT; Lspine precautions Follow-up:in 10-14 days Disposition - Home Discharged Condition: good   Discharge Instructions    Call MD / Call 911    Complete by:  As directed   If you experience chest pain or shortness of breath, CALL 911 and be transported to the hospital emergency room.  If you develope a fever above 101 F, pus (white drainage) or increased drainage or redness at the wound, or calf pain, call your surgeon's office.     Call MD / Call 911    Complete by:  As directed   If you experience chest pain or shortness of breath, CALL 911 and be transported to the hospital emergency room.  If you develope a fever above 101 F, pus (white drainage) or increased drainage or redness at the wound, or calf pain, call your surgeon's office.     Constipation Prevention    Complete by:  As directed   Drink plenty of fluids.  Prune juice may be helpful.  You may use a stool softener, such as Colace (over the counter) 100 mg twice a day.  Use MiraLax (over the counter) for constipation as needed.     Constipation Prevention    Complete by:  As directed   Drink plenty of fluids.  Prune juice may be helpful.  You may use a stool softener, such as Colace (over the counter) 100 mg twice a day.  Use MiraLax (over the counter) for constipation as needed.     Diet - low sodium heart healthy    Complete by:  As directed      Diet - low sodium heart healthy    Complete by:  As directed      Increase activity slowly as tolerated    Complete by:  As directed      Increase activity slowly as tolerated    Complete by:  As directed             Medication List    STOP taking these medications        fish oil-omega-3 fatty acids 1000 MG capsule      TAKE these  medications        aspirin 81 MG tablet  Take 1 tablet (81 mg total) by mouth daily.     carvedilol 12.5 MG tablet  Commonly known as:  COREG  Take 6.25 mg by mouth 2 (two) times daily with a meal.     colesevelam 625 MG tablet  Commonly known as:  WELCHOL  625 mg. Take 4 tablets by mouth at  dinner     docusate sodium 100 MG capsule  Commonly known as:  COLACE  Take 1 capsule (100 mg total) by mouth 2 (two) times daily as needed for mild constipation.     ibuprofen 200 MG tablet  Commonly known as:  ADVIL  Take 3 tablets (600 mg total) by mouth every 6 (six) hours as needed. Resume 5 days post-op     JANUVIA 50 MG tablet  Generic drug:  sitaGLIPtin  Take 50 mg by mouth daily.     levothyroxine 50 MCG tablet  Commonly known as:  SYNTHROID, LEVOTHROID  Take 50 mcg by mouth daily.     metFORMIN 1000 MG tablet  Commonly known as:  GLUCOPHAGE  Take 500 mg by mouth 3 (three) times daily.     nitroGLYCERIN 0.4 MG SL tablet  Commonly known as:  NITROSTAT  Place 1 tablet (0.4 mg total) under the tongue every 5 (five) minutes as needed for chest pain.     ondansetron 8 MG tablet  Commonly known as:  ZOFRAN  Take 1 tablet (8 mg total) by mouth every 8 (eight) hours as needed for nausea or vomiting.     oxyCODONE-acetaminophen 7.5-325 MG tablet  Commonly known as:  PERCOCET  Take 1-2 tablets by mouth every 4 (four) hours as needed for moderate pain or severe pain.     pantoprazole 40 MG tablet  Commonly known as:  PROTONIX  Take 40 mg by mouth daily.     PARoxetine 20 MG tablet  Commonly known as:  PAXIL  Take 20 mg by mouth every morning.     PRESCRIPTION MEDICATION  CHEMO [facility]     tamoxifen 20 MG tablet  Commonly known as:  NOLVADEX  Take 1 tablet by mouth  daily     valACYclovir 1000 MG tablet  Commonly known as:  VALTREX  1,000 mg 2 (two) times daily as needed (for viral infection).           Follow-up Information    Follow up with BEANE,JEFFREY C, MD In  2 weeks.   Specialty:  Orthopedic Surgery   Why:  For suture removal   Contact information:   [address] [place] [address] [phone]       Follow up with [place].   Why:  home health physical therapy   Contact information:   [place] [address] [phone]       Signed: Lacie Draft, PA-C Orthopaedic Surgery 10/22/2014, 10:23 AM

## 2014-10-22 NOTE — Progress Notes (Signed)
Physical Therapy Treatment Patient Details Name: [name] MRN: [name] DOB: May 28, 1940 Today's Date: 10/22/2014    History of Present Illness 74 yo female s/p MICRO LUMBER DECOMPRESSION L3-4 L4-5. PMH: GERD, thyroid disease, breast cancer, depression    PT Comments    Pt able to ambulate 80 feet in hallway with RW.  Pt reports increased pain with mobility and requiring some assist for transitional movements however spouse and daughter in room during session.  Back precautions also reviewed with pt.   Follow Up Recommendations  Home health PT;Supervision for mobility/OOB     Equipment Recommendations  Rolling walker with 5" wheels    Recommendations for Other Services       Precautions / Restrictions Precautions Precautions: Back;Fall Precaution Comments: reviewed back precaution verbally, also demonstrated Restrictions Weight Bearing Restrictions: No    Mobility  Bed Mobility Overal bed mobility: Needs Assistance Bed Mobility: Sit to Sidelying         Sit to sidelying: Mod assist General bed mobility comments: verbal cues for log roll technique, mod assist for LEs onto bed  Transfers Overall transfer level: Needs assistance Equipment used: Rolling walker (2 wheeled) Transfers: Sit to/from Stand Sit to Stand: Min assist         General transfer comment: increased difficulty coming to sitting position from reclined position, educated to use pillow for lumbar support, verbal cues for safe technique within precautions  Ambulation/Gait Ambulation/Gait assistance: Min guard Ambulation Distance (Feet): 80 Feet Assistive device: Rolling walker (2 wheeled) Gait Pattern/deviations: Step-through pattern;Decreased stride length     General Gait Details: slow but steady pace, occasional knee buckling due to pain however pt did not require assist at those times, able to use RW for support, distance per pt tolerance   Stairs Stairs:  (verbally reviewed one  step technique)          Wheelchair Mobility    Modified Rankin (Stroke Patients Only)       Balance Overall balance assessment: Needs assistance Sitting-balance support: No upper extremity supported;Feet supported Sitting balance-Leahy Scale: Fair     Standing balance support: Bilateral upper extremity supported;During functional activity Standing balance-Leahy Scale: Fair                      Cognition Arousal/Alertness: Awake/alert Behavior During Therapy: WFL for tasks assessed/performed Overall Cognitive Status: Within Functional Limits for tasks assessed                      Exercises      General Comments        Pertinent Vitals/Pain Pain Assessment: 0-10 Pain Score: 8  Faces Pain Scale: Hurts whole lot Pain Location: back with movement Pain Descriptors / Indicators: Aching;Sore Pain Intervention(s): Limited activity within patient's tolerance;Monitored during session;Premedicated before session;Repositioned    Home Living                      Prior Function            PT Goals (current goals can now be found in the care plan section) Progress towards PT goals: Progressing toward goals    Frequency  Min 5X/week    PT Plan Current plan remains appropriate    Co-evaluation             End of Session   Activity Tolerance: Patient limited by pain Patient left: in bed;with call bell/phone within reach;with family/visitor present  Time: 1013-1029 PT Time Calculation (min) (ACUTE ONLY): 16 min  Charges:  $Gait Training: 8-22 mins                    G Codes:      [name],[name] 11/13/2014, 11:29 AM Carmelia Bake, PT, DPT 11-13-2014 Pager: [phone]

## 2014-10-22 NOTE — Progress Notes (Signed)
Occupational Therapy Treatment Patient Details Name: [name] MRN: [id number] DOB: 1940/07/14 Today's Date: 10/22/2014    History of present illness 74 yo female s/p MICRO LUMBER DECOMPRESSION L3-4 L4-5. PMH: GERD, thyroid disease, breast cancer, depression   OT comments  Patient making progress towards OT goals, continue plan of care for now. Pt able to tolerate therapy better this am than yesterday am, ambulating <> BR for toilet transfer. Discussed use of BSC over toilet seat for comfort and safety, shower seat in tub/shower for safety with someone providing supervision, pt working on crossing BLEs to increase independence with LB ADLs.    Follow Up Recommendations  No OT follow up;Supervision/Assistance - 24 hour    Equipment Recommendations  Other (comment) (AE - sock aid, LH sponge; pt reports she has a BSC and shower seat from her mother that she can use)    Recommendations for Other Services  None at this time   Precautions / Restrictions Precautions Precautions: Back;Fall Precaution Comments: Pt able to verbalize 2/3 back precautions independently, needed cueing for no arching  Restrictions Weight Bearing Restrictions: No    Mobility Bed Mobility General bed mobility comments: pt up in recliner on arrival  Transfers Overall transfer level: Needs assistance Equipment used: Rolling walker (2 wheeled) Transfers: Sit to/from Stand Sit to Stand: Min assist General transfer comment: Cues for hand placement, safety, technique. Min assist for safety.     Balance Overall balance assessment: Needs assistance Sitting-balance support: No upper extremity supported;Feet supported Sitting balance-Leahy Scale: Fair     Standing balance support: Bilateral upper extremity supported;During functional activity Standing balance-Leahy Scale: Fair   ADL Overall ADL's : Needs assistance/impaired General ADL Comments: Pt found seated in recliner. Pt stated she slept in recliner  secondary to being more comfortable than the bed. Pt with difficult time sitting up from recliner, requring min assist for trunk support/control > sitting unsupported. Pt barely able to cross BLEs to perform LB ADLs. Encouraged pt to work on crossing legs to increase independence with this. Educated pt on AE (reacher, sock aid, LH sponge). Pt stood from recliner with min assist and ambulated into BR with min guard assist. Pt sat on toilet and immediatly stood back up secondary to pain. Pt will benefit from [facility] over toilet seat in hosptial room and at home. Put BSC over toilet seat and encouraged pt to call for assistance and walk into BR for toileting needs. Pt ambulated back to recliner. RN and NT present during session for vitals and pain meds.      Cognition   Behavior During Therapy: WFL for tasks assessed/performed Overall Cognitive Status: Within Functional Limits for tasks assessed                 Pertinent Vitals/ Pain       Pain Assessment: Faces Faces Pain Scale: Hurts whole lot Pain Location: back with movement  Pain Descriptors / Indicators: Aching;Sore;Grimacing;Guarding Pain Intervention(s): Limited activity within patient's tolerance;Monitored during session;Repositioned;RN gave pain meds during session;Patient requesting pain meds-RN notified   Frequency Min 2X/week     Progress Toward Goals  OT Goals(current goals can now bfound in the care plan section)  Progress towards OT goals: Progressing toward goals     Plan Discharge plan remains appropriate    End of Session Equipment Utilized During Treatment: Gait belt;Rolling walker   Activity Tolerance Patient limited by pain   Patient Left in chair;with call bell/phone within reach;with nursing/sitter in room  Nurse Communication Mobility  status (encourge pt to ambulate <> BR with assistance from staff)     Time: [id number] OT Time Calculation (min): 18 min  Charges: OT General Charges $OT Visit: 1  Procedure OT Treatments $Self Care/Home Management : 8-22 mins  CLAY,[name] , MS, OTR/L, CLT Pager: [phone]  10/22/2014, 8:48 AM

## 2014-10-26 ENCOUNTER — Encounter (HOSPITAL_COMMUNITY): Payer: Self-pay | Admitting: Emergency Medicine

## 2014-10-26 ENCOUNTER — Emergency Department (HOSPITAL_COMMUNITY)
Admission: EM | Admit: 2014-10-26 | Discharge: 2014-10-26 | Disposition: A | Discharge status: Home / Self Care | Payer: Medicare Other | Attending: Emergency Medicine | Admitting: Emergency Medicine

## 2014-10-26 DIAGNOSIS — Z8701 Personal history of pneumonia (recurrent): Secondary | ICD-10-CM | POA: Insufficient documentation

## 2014-10-26 DIAGNOSIS — R1110 Vomiting, unspecified: Secondary | ICD-10-CM | POA: Insufficient documentation

## 2014-10-26 DIAGNOSIS — Z8619 Personal history of other infectious and parasitic diseases: Secondary | ICD-10-CM | POA: Diagnosis not present

## 2014-10-26 DIAGNOSIS — E039 Hypothyroidism, unspecified: Secondary | ICD-10-CM | POA: Insufficient documentation

## 2014-10-26 DIAGNOSIS — I158 Other secondary hypertension: Secondary | ICD-10-CM | POA: Diagnosis not present

## 2014-10-26 DIAGNOSIS — Z79899 Other long term (current) drug therapy: Secondary | ICD-10-CM | POA: Diagnosis not present

## 2014-10-26 DIAGNOSIS — Z87891 Personal history of nicotine dependence: Secondary | ICD-10-CM | POA: Diagnosis not present

## 2014-10-26 DIAGNOSIS — Z7982 Long term (current) use of aspirin: Secondary | ICD-10-CM | POA: Diagnosis not present

## 2014-10-26 DIAGNOSIS — R011 Cardiac murmur, unspecified: Secondary | ICD-10-CM | POA: Insufficient documentation

## 2014-10-26 DIAGNOSIS — K219 Gastro-esophageal reflux disease without esophagitis: Secondary | ICD-10-CM | POA: Diagnosis not present

## 2014-10-26 DIAGNOSIS — F329 Major depressive disorder, single episode, unspecified: Secondary | ICD-10-CM | POA: Diagnosis not present

## 2014-10-26 DIAGNOSIS — F419 Anxiety disorder, unspecified: Secondary | ICD-10-CM | POA: Insufficient documentation

## 2014-10-26 DIAGNOSIS — Z853 Personal history of malignant neoplasm of breast: Secondary | ICD-10-CM | POA: Insufficient documentation

## 2014-10-26 DIAGNOSIS — I10 Essential (primary) hypertension: Secondary | ICD-10-CM | POA: Diagnosis not present

## 2014-10-26 DIAGNOSIS — I2510 Atherosclerotic heart disease of native coronary artery without angina pectoris: Secondary | ICD-10-CM | POA: Diagnosis not present

## 2014-10-26 DIAGNOSIS — R079 Chest pain, unspecified: Secondary | ICD-10-CM | POA: Diagnosis present

## 2014-10-26 DIAGNOSIS — Z862 Personal history of diseases of the blood and blood-forming organs and certain disorders involving the immune mechanism: Secondary | ICD-10-CM | POA: Diagnosis not present

## 2014-10-26 DIAGNOSIS — Z86018 Personal history of other benign neoplasm: Secondary | ICD-10-CM | POA: Insufficient documentation

## 2014-10-26 LAB — COMPREHENSIVE METABOLIC PANEL
ALT: 16 U/L (ref 14–54)
AST: 19 U/L (ref 15–41)
Albumin: 3 g/dL — ABNORMAL LOW (ref 3.5–5.0)
Alkaline Phosphatase: 68 U/L (ref 38–126)
Anion gap: 10 (ref 5–15)
BILIRUBIN TOTAL: 0.4 mg/dL (ref 0.3–1.2)
BUN: 17 mg/dL (ref 6–20)
CO2: 26 mmol/L (ref 22–32)
Calcium: 9.3 mg/dL (ref 8.9–10.3)
Chloride: 97 mmol/L — ABNORMAL LOW (ref 101–111)
Creatinine, Ser: 1.13 mg/dL — ABNORMAL HIGH (ref 0.44–1.00)
GFR calc Af Amer: 55 mL/min — ABNORMAL LOW (ref >60)
GFR, EST NON AFRICAN AMERICAN: 48 mL/min — AB (ref >60)
Glucose, Bld: 237 mg/dL — ABNORMAL HIGH (ref 65–99)
POTASSIUM: 4.8 mmol/L (ref 3.5–5.1)
Sodium: 133 mmol/L — ABNORMAL LOW (ref 135–145)
TOTAL PROTEIN: 7.4 g/dL (ref 6.5–8.1)

## 2014-10-26 LAB — CBC
HEMATOCRIT: 34.4 % — AB (ref 36.0–46.0)
Hemoglobin: 11.8 g/dL — ABNORMAL LOW (ref 12.0–15.0)
MCH: 30.3 pg (ref 26.0–34.0)
MCHC: 34.3 g/dL (ref 30.0–36.0)
MCV: 88.2 fL (ref 78.0–100.0)
PLATELETS: 347 10*3/uL (ref 150–400)
RBC: 3.9 MIL/uL (ref 3.87–5.11)
RDW: 12.6 % (ref 11.5–15.5)
WBC: 7.9 10*3/uL (ref 4.0–10.5)

## 2014-10-26 LAB — I-STAT TROPONIN, ED: TROPONIN I, POC: 0.01 ng/mL (ref 0.00–0.08)

## 2014-10-26 LAB — CBG MONITORING, ED: Glucose-Capillary: 231 mg/dL — ABNORMAL HIGH (ref 65–99)

## 2014-10-26 LAB — LIPASE, BLOOD: Lipase: 22 U/L (ref 22–51)

## 2014-10-26 MED ORDER — MORPHINE SULFATE (PF) 4 MG/ML IV SOLN
4.0000 mg | Freq: Once | INTRAVENOUS | Status: DC
  Filled 2014-10-26: qty 1

## 2014-10-26 MED ORDER — OXYCODONE-ACETAMINOPHEN 5-325 MG PO TABS
1.0000 | ORAL_TABLET | Freq: Once | ORAL | Status: AC
  Administered 2014-10-26: 1 via ORAL
  Filled 2014-10-26: qty 1

## 2014-10-26 MED ORDER — MORPHINE SULFATE (PF) 4 MG/ML IV SOLN
4.0000 mg | Freq: Once | INTRAVENOUS | Status: AC
  Administered 2014-10-26: 4 mg via INTRAMUSCULAR

## 2014-10-26 MED ORDER — ONDANSETRON 8 MG PO TBDP
8.0000 mg | ORAL_TABLET | Freq: Once | ORAL | Status: AC
  Administered 2014-10-26: 8 mg via ORAL
  Filled 2014-10-26: qty 1

## 2014-10-26 NOTE — ED Notes (Signed)
Attempted to collect labs however unsuccessful.

## 2014-10-26 NOTE — Discharge Instructions (Signed)
Hypertension Hypertension, commonly called high blood pressure, is when the force of blood pumping through your arteries is too strong. Your arteries are the blood vessels that carry blood from your heart throughout your body. A blood pressure reading consists of a higher number over a lower number, such as 110/72. The higher number (systolic) is the pressure inside your arteries when your heart pumps. The lower number (diastolic) is the pressure inside your arteries when your heart relaxes. Ideally you want your blood pressure below 120/80. Hypertension forces your heart to work harder to pump blood. Your arteries may become narrow or stiff. Having untreated or uncontrolled hypertension can cause heart attack, stroke, kidney disease, and other problems. RISK FACTORS Some risk factors for high blood pressure are controllable. Others are not.  Risk factors you cannot control include:   Race. You may be at higher risk if you are African American.  Age. Risk increases with age.  Gender. Men are at higher risk than women before age 45 years. After age 65, women are at higher risk than men. Risk factors you can control include:  Not getting enough exercise or physical activity.  Being overweight.  Getting too much fat, sugar, calories, or salt in your diet.  Drinking too much alcohol. SIGNS AND SYMPTOMS Hypertension does not usually cause signs or symptoms. Extremely high blood pressure (hypertensive crisis) may cause headache, anxiety, shortness of breath, and nosebleed. DIAGNOSIS To check if you have hypertension, your health care provider will measure your blood pressure while you are seated, with your arm held at the level of your heart. It should be measured at least twice using the same arm. Certain conditions can cause a difference in blood pressure between your right and left arms. A blood pressure reading that is higher than normal on one occasion does not mean that you need treatment. If  it is not clear whether you have high blood pressure, you may be asked to return on a different day to have your blood pressure checked again. Or, you may be asked to monitor your blood pressure at home for 1 or more weeks. TREATMENT Treating high blood pressure includes making lifestyle changes and possibly taking medicine. Living a healthy lifestyle can help lower high blood pressure. You may need to change some of your habits. Lifestyle changes may include:  Following the DASH diet. This diet is high in fruits, vegetables, and whole grains. It is low in salt, red meat, and added sugars.  Keep your sodium intake below 2,300 mg per day.  Getting at least 30-45 minutes of aerobic exercise at least 4 times per week.  Losing weight if necessary.  Not smoking.  Limiting alcoholic beverages.  Learning ways to reduce stress. Your health care provider may prescribe medicine if lifestyle changes are not enough to get your blood pressure under control, and if one of the following is true:  You are 18-59 years of age and your systolic blood pressure is above 140.  You are 60 years of age or older, and your systolic blood pressure is above 150.  Your diastolic blood pressure is above 90.  You have diabetes, and your systolic blood pressure is over 140 or your diastolic blood pressure is over 90.  You have kidney disease and your blood pressure is above 140/90.  You have heart disease and your blood pressure is above 140/90. Your personal target blood pressure may vary depending on your medical conditions, your age, and other factors. HOME CARE INSTRUCTIONS    Have your blood pressure rechecked as directed by your health care provider.   Take medicines only as directed by your health care provider. Follow the directions carefully. Blood pressure medicines must be taken as prescribed. The medicine does not work as well when you skip doses. Skipping doses also puts you at risk for  problems.  Do not smoke.   Monitor your blood pressure at home as directed by your health care provider. SEEK MEDICAL CARE IF:   You think you are having a reaction to medicines taken.  You have recurrent headaches or feel dizzy.  You have swelling in your ankles.  You have trouble with your vision. SEEK IMMEDIATE MEDICAL CARE IF:  You develop a severe headache or confusion.  You have unusual weakness, numbness, or feel faint.  You have severe chest or abdominal pain.  You vomit repeatedly.  You have trouble breathing. MAKE SURE YOU:   Understand these instructions.  Will watch your condition.  Will get help right away if you are not doing well or get worse.   This information is not intended to replace advice given to you by your health care provider. Make sure you discuss any questions you have with your health care provider.   Document Released: 01/08/2005 Document Revised: 05/25/2014 Document Reviewed: 10/31/2012 Elsevier Interactive Patient Education 2016 Elsevier Inc.  

## 2014-10-26 NOTE — ED Provider Notes (Signed)
CSN: [id number]     Arrival date & time 10/26/14  1750 History   First MD Initiated Contact with Patient 10/26/14 2147     Chief Complaint  Patient presents with  . Emesis  . Chest Pain  . Recent Surgery      (Consider location/radiation/quality/duration/timing/severity/associated sxs/prior Treatment) HPI Comments: Patient here due to increased back pain with associated high blood pressure at home. States that she was at physical therapy today and her blood pressure was elevated here with his systolic of 295. She took an extra dose of her Coreg. She became very anxious and did have some chest tightness but denies any anginal quality to it. No fever or cough. No weakness to her legs. Blood pressure began to gradually improve. She did have one episode of emesis prior to arrival but none since then. Denies any severe headaches. No neck pain. No focal neurological weakness.  The history is provided by the patient and the spouse.    Past Medical History  Diagnosis Date  . Elevated cholesterol   . GERD (gastroesophageal reflux disease)   . Thyroid disease   . Hypertension   . Coronary artery disease     stent 2007  . Depression   . Diabetes mellitus   . Hypothyroidism   . Breast cancer ([place]) 12/05/10    R breast, inv mammary, in situ,ER/PR +,HER2 -  . Arthritis   . Carcinoma of breast treated with adjuvant chemotherapy ([place])   . Heart murmur     secondary to chem. 2013  . Anemia     childhood  . Osteoarthritis   . HSV-1 (herpes simplex virus 1) infection   . Hyperlipidemia   . Anxiety   . Diverticulosis 12/10/03  . 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 Cancer ([place])   . Pneumonia     2014 september  . 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 Difficulty sleeping   . Spinal stenosis    Past Surgical History  Procedure Laterality Date  . Cholecystectomy    . Knee surgery  2007  . Carotid stent  2007  . Carpal tunnel release Bilateral 9/12,8,12  .  Dilation and curettage of uterus    . Coronary angioplasty    . Foot spurs    . Breast lumpectomy  1983    benign biopsy  . Breast lumpectomy Right 12/29/2010    snbx, ER/PR +, Her2 -, 0/1 node pos.  . Portacath placement  02/07/2011    Procedure: INSERTION PORT-A-CATH;  Surgeon: [name], MD;  Location: [facility];  Service: General;  Laterality: N/A;  . Port-a-cath removal  04/17/2011    Procedure: REMOVAL PORT-A-CATH;  Surgeon: [name], MD;  Location: [place];  Service: General;  Laterality: Left;  . Hysteroscopy w/d&c N/A 09/11/2012    Procedure: DILATATION AND CURETTAGE ;  Surgeon: [name], MD;  Location: [facility];  Service: Gynecology;  Laterality: N/A;  . Esophagogastroduodenoscopy N/A 02/09/2013    Procedure: ESOPHAGOGASTRODUODENOSCOPY (EGD);  Surgeon: [name], MD;  Location: [name];  Service: Endoscopy;  Laterality: N/A;  . Colonoscopy N/A 02/09/2013    Procedure: COLONOSCOPY;  Surgeon: [name], MD;  Location: [facility];  Service: Endoscopy;  Laterality: N/A;  . Lumbar laminectomy/decompression microdiscectomy N/A 10/20/2014    Procedure: MICRO LUMBER DECOMPRESSION L3-4 L4-5;  Surgeon: [name], MD;  Location: [facility];  Service: Orthopedics;  Laterality: N/A;   Family History  Problem Relation Age of Onset  . Cancer Son   . Throat cancer Father   . Lymphoma Brother 51  . Colon cancer Neg Hx   . Heart disease Mother   . Alcoholism Father   . Heart attack Father   . Heart attack Brother   . Diabetes Brother    Social History  Substance Use Topics  . Smoking status: Former Smoker -- 2.00 packs/day for 40 years    Quit date: 12/19/1997  . Smokeless tobacco: Never Used  . Alcohol Use: 3.6 oz/week    6 Glasses of wine per week     Comment: occasional   OB History    Gravida Para Term Preterm AB TAB SAB Ectopic Multiple Living   3 3              Obstetric Comments   MENARCHE AGE 47, MENOPAUSE MID 40'S, HRT X 10  YRS, D/C 2002     Review of Systems  All other systems reviewed and are negative.     Allergies  Codeine; Antihistamines, diphenhydramine-type; Statins; and Erythromycin  Home Medications   Prior to Admission medications   Medication Sig Start Date End Date Taking? Authorizing Provider  aspirin 81 MG tablet Take 1 tablet (81 mg total) by mouth daily. 10/21/14  Yes Cecilie Kicks, PA-C  carvedilol (COREG) 12.5 MG tablet Take 6.25 mg by mouth 2 (two) times daily with a meal.    Yes Historical Provider, MD  colesevelam (WELCHOL) 625 MG tablet 625 mg. Take 4 tablets by mouth at  dinner 02/15/14  Yes Historical Provider, MD  docusate sodium (COLACE) 100 MG capsule Take 1 capsule (100 mg total) by mouth 2 (two) times daily as needed for mild constipation. 10/20/14  Yes [name], MD  ibuprofen (ADVIL) 200 MG tablet Take 3 tablets (600 mg total) by mouth every 6 (six) hours as needed. Resume 5 days post-op 10/21/14  Yes Cecilie Kicks, PA-C  levothyroxine (SYNTHROID, LEVOTHROID) 50 MCG tablet Take 50 mcg by mouth daily.   Yes Historical Provider, MD  metFORMIN (GLUCOPHAGE) 1000 MG tablet Take 500 mg by mouth 3 (three) times daily.  10/12/12  Yes Historical Provider, MD  mupirocin ointment (BACTROBAN) 2 % APPLY TO EACH NOSTRIL BID 10/13/14  Yes Historical Provider, MD  ondansetron (ZOFRAN) 8 MG tablet Take 1 tablet (8 mg total) by mouth every 8 (eight) hours as needed for nausea or vomiting. 10/20/14  Yes [name], MD  oxyCODONE-acetaminophen (PERCOCET) 7.5-325 MG tablet Take 1-2 tablets by mouth every 4 (four) hours as needed for moderate pain or severe pain. 10/22/14  Yes Cecilie Kicks, PA-C  pantoprazole (PROTONIX) 40 MG tablet Take 40 mg by mouth daily.   Yes Historical Provider, MD  PARoxetine (PAXIL) 20 MG tablet Take 20 mg by mouth every morning.   Yes Historical Provider, MD  tamoxifen (NOLVADEX) 20 MG tablet Take 1 tablet by mouth  daily 09/21/14  Yes Chauncey Cruel, MD   nitroGLYCERIN (NITROSTAT) 0.4 MG SL tablet Place 1 tablet (0.4 mg total) under the tongue every 5 (five) minutes as needed for chest pain. 07/02/13   Josue Hector, MD  [place]    Historical Provider, MD  valACYclovir (VALTREX) 1000 MG tablet 1,000 mg 2 (two) times daily as needed (for viral infection).  05/13/13   Historical Provider, MD   BP 119/77 mmHg  Pulse 93  Temp(Src) 98.3 F (36.8 C) (Oral)  Resp 16  SpO2 100%  Physical Exam  Constitutional: She is oriented to person, place, and time. She appears well-developed and well-nourished.  Non-toxic appearance. No distress.  HENT:  Head: Normocephalic and atraumatic.  Eyes: Conjunctivae, EOM and lids are normal. Pupils are equal, round, and reactive to light.  Neck: Normal range of motion. Neck supple. No tracheal deviation present. No thyroid mass present.  Cardiovascular: Normal rate, regular rhythm and normal heart sounds.  Exam reveals no gallop.   No murmur heard. Pulmonary/Chest: Effort normal and breath sounds normal. No stridor. No respiratory distress. She has no decreased breath sounds. She has no wheezes. She has no rhonchi. She has no rales.  Abdominal: Soft. Normal appearance and bowel sounds are normal. She exhibits no distension. There is no tenderness. There is no rebound and no CVA tenderness.  Musculoskeletal: Normal range of motion. She exhibits no edema or tenderness.  Neurological: She is alert and oriented to person, place, and time. She has normal strength. No cranial nerve deficit or sensory deficit. GCS eye subscore is 4. GCS verbal subscore is 5. GCS motor subscore is 6.  Skin: Skin is warm and dry. No abrasion and no rash noted.  Psychiatric: She has a normal mood and affect. Her speech is normal and behavior is normal.  Nursing note and vitals reviewed.   ED Course  Procedures (including critical care time) Labs Review Labs Reviewed  COMPREHENSIVE METABOLIC PANEL - Abnormal;  Notable for the following:    Sodium 133 (*)    Chloride 97 (*)    Glucose, Bld 237 (*)    Creatinine, Ser 1.13 (*)    Albumin 3.0 (*)    GFR calc non Af Amer 48 (*)    GFR calc Af Amer 55 (*)    All other components within normal limits  CBC - Abnormal; Notable for the following:    Hemoglobin 11.8 (*)    HCT 34.4 (*)    All other components within normal limits  CBG MONITORING, ED - Abnormal; Notable for the following:    Glucose-Capillary 231 (*)    All other components within normal limits  LIPASE, BLOOD  URINALYSIS, ROUTINE W REFLEX MICROSCOPIC (NOT AT [facility])  I-STAT TROPOININ, ED    Imaging Review No results found. I have personally reviewed and evaluated these images and lab results as part of my medical decision-making.   EKG Interpretation   Date/Time:  Tuesday October 26 2014 18:15:46 EDT Ventricular Rate:  95 PR Interval:  124 QRS Duration: 73 QT Interval:  379 QTC Calculation: 476 R Axis:   71 Text Interpretation:  Sinus rhythm Nonspecific T abnrm, anterolateral  leads Baseline wander in lead(s) I V3 V4 Confirmed by Kaniya Trueheart  MD, Jentry Warnell  ([id number]) on 10/26/2014 9:48:14 PM      MDM   Final diagnoses:  None   Patient's blood pressure here has improved. I offered to look at her surgical incision and she deferred. Do not think that this represents ACS. Patient stable for discharge    Lacretia Leigh, MD 10/26/14 2216

## 2014-10-26 NOTE — ED Notes (Signed)
Pt reports back surgery on 9/28. Having increased back pain and chest tightness today. Also reports HTN at home with [id number] systolic. Took two extra 0.5 mg Coreg pills today. Endorses chest tightness without SOB. Denies diarrhea or fever. No other c/c.

## 2014-11-16 ENCOUNTER — Other Ambulatory Visit: Payer: Self-pay | Admitting: Obstetrics and Gynecology

## 2014-11-16 DIAGNOSIS — Z853 Personal history of malignant neoplasm of breast: Secondary | ICD-10-CM

## 2014-12-20 ENCOUNTER — Ambulatory Visit
Admission: RE | Admit: 2014-12-20 | Discharge: 2014-12-20 | Disposition: A | Discharge status: Home / Self Care | Payer: Medicare Other | Source: Ambulatory Visit | Attending: Obstetrics and Gynecology | Admitting: Obstetrics and Gynecology

## 2014-12-20 DIAGNOSIS — Z853 Personal history of malignant neoplasm of breast: Secondary | ICD-10-CM

## 2015-01-31 ENCOUNTER — Ambulatory Visit: Payer: Medicare Other | Admitting: Cardiovascular Disease

## 2015-02-08 ENCOUNTER — Encounter: Payer: Self-pay | Admitting: Cardiology

## 2015-02-08 ENCOUNTER — Ambulatory Visit (INDEPENDENT_AMBULATORY_CARE_PROVIDER_SITE_OTHER): Payer: Medicare Other | Admitting: Cardiology

## 2015-02-08 VITALS — BP 140/70 | HR 80 | Ht 63.0 in | Wt 151.0 lb

## 2015-02-08 DIAGNOSIS — I350 Nonrheumatic aortic (valve) stenosis: Secondary | ICD-10-CM | POA: Diagnosis not present

## 2015-02-08 DIAGNOSIS — M48061 Spinal stenosis, lumbar region without neurogenic claudication: Secondary | ICD-10-CM

## 2015-02-08 DIAGNOSIS — I2510 Atherosclerotic heart disease of native coronary artery without angina pectoris: Secondary | ICD-10-CM

## 2015-02-08 DIAGNOSIS — E785 Hyperlipidemia, unspecified: Secondary | ICD-10-CM | POA: Diagnosis not present

## 2015-02-08 DIAGNOSIS — Z853 Personal history of malignant neoplasm of breast: Secondary | ICD-10-CM

## 2015-02-08 DIAGNOSIS — I10 Essential (primary) hypertension: Secondary | ICD-10-CM

## 2015-02-08 DIAGNOSIS — Z9861 Coronary angioplasty status: Secondary | ICD-10-CM

## 2015-02-08 DIAGNOSIS — M4806 Spinal stenosis, lumbar region: Secondary | ICD-10-CM

## 2015-02-08 NOTE — Assessment & Plan Note (Signed)
On Welchol, statin intolerant

## 2015-02-08 NOTE — Assessment & Plan Note (Signed)
RCA HSRA and Taxus stent in 2007 by Dr Lia Foyer.  Re look cath in 2008 OK, no Lt system disease and RCA patent

## 2015-02-08 NOTE — Patient Instructions (Signed)
Medication Instructions:   Continue same medications  If you need a refill on your cardiac medications before your next appointment, please call your pharmacy.  Labwork: NONE ORDER TODAY    Testing/Procedures: NONE ORDER TODAY    Follow-Up:  Your physician wants you to follow-up in:  IN  [name] Cancel  you will receive a reminder letter in the mail two months in advance. If you don't receive a letter, please call our office to schedule the follow-up appointment.      Any Other Special Instructions Will Be Listed Below (If Applicable).

## 2015-02-08 NOTE — Assessment & Plan Note (Signed)
Controlled.  

## 2015-02-08 NOTE — Progress Notes (Signed)
02/08/2015 [name]   [birth year]  [id number]  Primary Physician [name], MD Primary Cardiologist: Dr Johnsie Cancel  HPI:  75 y/o female with a history of CAD, s/p RCA HSRA and DES (Taxus) in 2007. Cath in 2008 showed patent RCA and no significant Lt system disease. She has done well from a cardiac standpoint. She had breast cancer in 2012 and has not had recurrence. She had back surgery in Sept 2016 and has done well. She is in the office today for a 6 month check up. She denies any palpitations, dyspnea, or chest pain.    Current Outpatient Prescriptions  Medication Sig Dispense Refill  . aspirin 81 MG tablet Take 1 tablet (81 mg total) by mouth daily. 30 tablet   . carvedilol (COREG) 12.5 MG tablet Take 6.25 mg by mouth 2 (two) times daily with a meal.     . colesevelam (WELCHOL) 625 MG tablet 625 mg. Take 4 tablets by mouth at  dinner    . ibuprofen (ADVIL) 200 MG tablet Take 3 tablets (600 mg total) by mouth every 6 (six) hours as needed. Resume 5 days post-op 30 tablet 0  . levothyroxine (SYNTHROID, LEVOTHROID) 50 MCG tablet Take 50 mcg by mouth daily.    . metFORMIN (GLUCOPHAGE) 1000 MG tablet Take 500 mg by mouth 3 (three) times daily.     . nitroGLYCERIN (NITROSTAT) 0.4 MG SL tablet Place 1 tablet (0.4 mg total) under the tongue every 5 (five) minutes as needed for chest pain. 25 tablet 3  . pantoprazole (PROTONIX) 40 MG tablet Take 40 mg by mouth daily.    Marland Kitchen PARoxetine (PAXIL) 20 MG tablet Take 20 mg by mouth every morning.    . sitaGLIPtin (JANUVIA) 100 MG tablet Take 100 mg by mouth daily.    . tamoxifen (NOLVADEX) 20 MG tablet Take 1 tablet by mouth  daily 90 tablet 3  . valACYclovir (VALTREX) 1000 MG tablet 1,000 mg 2 (two) times daily as needed (for viral infection).      No current facility-administered medications for this visit.    Allergies  Allergen Reactions  . Codeine Nausea And Vomiting and Other (See Comments)    Severe stomach cramps  .  Antihistamines, Diphenhydramine-Type Other (See Comments)    Causes hyperactivity  . Statins Other (See Comments)    Joint pains  . Erythromycin Hives and Rash    Due to dental work about 50 years ago. Was used in packing and resulted in rash/hives inside and outside of mouth.    Social History   Social History  . Marital Status: Married    Spouse Name: N/A  . Number of Children: N/A  . Years of Education: N/A   Occupational History  . retired    Social History Main Topics  . Smoking status: Former Smoker -- 2.00 packs/day for 40 years    Quit date: 12/19/1997  . Smokeless tobacco: Never Used  . Alcohol Use: 3.6 oz/week    6 Glasses of wine per week     Comment: occasional  . Drug Use: No  . Sexual Activity: Not on file     Comment: menarch age 41, P3, HRT X 10 YRS, MENOPAUSE MID 40'S   Other Topics Concern  . Not on file   Social History Narrative   [name] age 53- [name] age 69       Review of Systems: General: negative for chills, fever, night sweats or weight changes.  Cardiovascular:  negative for chest pain, dyspnea on exertion, edema, orthopnea, palpitations, paroxysmal nocturnal dyspnea or shortness of breath Dermatological: negative for rash Respiratory: negative for cough or wheezing Urologic: negative for hematuria Abdominal: negative for nausea, vomiting, diarrhea, bright red blood per rectum, melena, or hematemesis Neurologic: negative for visual changes, syncope, or dizziness All other systems reviewed and are otherwise negative except as noted above.    Blood pressure 140/70, pulse 80, height 5\' 3"  (1.6 m), weight 151 lb (68.493 kg).  General appearance: alert, cooperative and no distress Neck: no carotid bruit and no JVD Lungs: clear to auscultation bilaterally Heart: regular rate and rhythm and 2/6 systolic murmur AOV, preserved S2 Extremities: extremities normal, atraumatic, no cyanosis or edema Skin: Skin color, texture,  turgor normal. No rashes or lesions Neurologic: Grossly normal   ASSESSMENT AND PLAN:   CAD S/P PCI 2007 RCA HSRA and Taxus stent in 2007 by Dr Lia Foyer.  Re look cath in 2008 OK, no Lt system disease and RCA patent   Essential hypertension Controlled  Elevated lipids On Welchol, statin intolerant- LDL was 83 in 2010, followed by PCP  Mild aortic stenosis Echo Dec 2015  History of breast cancer Rt mastectomy Dec 2012 followed by chemo and radiation  Spinal stenosis of lumbar region Surgery Sept 2016   PLAN Same Rx, f/u Dr Johnsie Cancel in 6 months.   Kerin Ransom K PA-C 02/08/2015 11:31 AM

## 2015-02-08 NOTE — Assessment & Plan Note (Signed)
Echo Dec 2015

## 2015-02-08 NOTE — Assessment & Plan Note (Signed)
Surgery Sept 2016

## 2015-02-08 NOTE — Assessment & Plan Note (Signed)
Rt mastectomy Dec 2012 followed by chemo and radiation

## 2015-02-21 ENCOUNTER — Other Ambulatory Visit (HOSPITAL_BASED_OUTPATIENT_CLINIC_OR_DEPARTMENT_OTHER): Payer: Medicare Other

## 2015-02-21 ENCOUNTER — Telehealth: Payer: Self-pay | Admitting: Oncology

## 2015-02-21 ENCOUNTER — Ambulatory Visit (HOSPITAL_BASED_OUTPATIENT_CLINIC_OR_DEPARTMENT_OTHER): Payer: Medicare Other | Admitting: Oncology

## 2015-02-21 VITALS — BP 156/74 | HR 88 | Temp 98.0°F | Resp 18 | Wt 150.3 lb

## 2015-02-21 DIAGNOSIS — C50511 Malignant neoplasm of lower-outer quadrant of right female breast: Secondary | ICD-10-CM

## 2015-02-21 DIAGNOSIS — C50911 Malignant neoplasm of unspecified site of right female breast: Secondary | ICD-10-CM | POA: Diagnosis not present

## 2015-02-21 DIAGNOSIS — Z79810 Long term (current) use of selective estrogen receptor modulators (SERMs): Secondary | ICD-10-CM

## 2015-02-21 DIAGNOSIS — Z170 Estrogen receptor positive status [ER+]: Secondary | ICD-10-CM

## 2015-02-21 LAB — COMPREHENSIVE METABOLIC PANEL
ALBUMIN: 3.3 g/dL — AB (ref 3.5–5.0)
ALK PHOS: 73 U/L (ref 40–150)
ALT: 16 U/L (ref 0–55)
AST: 14 U/L (ref 5–34)
Anion Gap: 13 mEq/L — ABNORMAL HIGH (ref 3–11)
BUN: 19.1 mg/dL (ref 7.0–26.0)
CALCIUM: 8.8 mg/dL (ref 8.4–10.4)
CHLORIDE: 104 meq/L (ref 98–109)
CO2: 21 mEq/L — ABNORMAL LOW (ref 22–29)
CREATININE: 1.4 mg/dL — AB (ref 0.6–1.1)
EGFR: 37 mL/min/{1.73_m2} — ABNORMAL LOW (ref >90)
GLUCOSE: 269 mg/dL — AB (ref 70–140)
POTASSIUM: 4.4 meq/L (ref 3.5–5.1)
SODIUM: 139 meq/L (ref 136–145)
Total Bilirubin: 0.35 mg/dL (ref 0.20–1.20)
Total Protein: 7.1 g/dL (ref 6.4–8.3)

## 2015-02-21 LAB — CBC WITH DIFFERENTIAL/PLATELET
BASO%: 0.7 % (ref 0.0–2.0)
BASOS ABS: 0.1 10*3/uL (ref 0.0–0.1)
EOS%: 2.6 % (ref 0.0–7.0)
Eosinophils Absolute: 0.3 10*3/uL (ref 0.0–0.5)
HEMATOCRIT: 36.7 % (ref 34.8–46.6)
HEMOGLOBIN: 11.8 g/dL (ref 11.6–15.9)
LYMPH#: 1.9 10*3/uL (ref 0.9–3.3)
LYMPH%: 16.1 % (ref 14.0–49.7)
MCH: 28.8 pg (ref 25.1–34.0)
MCHC: 32.3 g/dL (ref 31.5–36.0)
MCV: 89.3 fL (ref 79.5–101.0)
MONO#: 1.2 10*3/uL — ABNORMAL HIGH (ref 0.1–0.9)
MONO%: 10.3 % (ref 0.0–14.0)
NEUT#: 8.2 10*3/uL — ABNORMAL HIGH (ref 1.5–6.5)
NEUT%: 70.3 % (ref 38.4–76.8)
Platelets: 234 10*3/uL (ref 145–400)
RBC: 4.11 10*6/uL (ref 3.70–5.45)
RDW: 13.5 % (ref 11.2–14.5)
WBC: 11.7 10*3/uL — AB (ref 3.9–10.3)

## 2015-02-21 MED ORDER — TAMOXIFEN CITRATE 20 MG PO TABS: ORAL_TABLET | ORAL | Status: DC

## 2015-02-21 NOTE — Progress Notes (Signed)
ID: [name]   DOB: 07-03-1940  MR#: [id number]  CSN#:[id number]  PCP:  [name], MD GYN:  [name], MD SUR:  [name], MD [place]:  [name], MD OTHER:  [name], MD;  [name], MD  CHIEF COMPLAINT:  Right Breast Cancer  CURRENT TREATMENT: tamoxifen daily   BREAST CANCER HISTORY: From the original intake note:  The patient had a screening mammogram July of 2009, which was unremarkable. Screening mammography 2012 showed a possible mass in the right breast. Right diagnostic mammogram 12/05/2010 confirmed an irregularly marginated oval mass in the right breast at the 6:00 position measuring up to 1.4 cm. On physical exam Dr. [name] was not able to palpate a discrete abnormality, but ultrasound showed an irregularly marginated hypoechoic mass measuring 1.1 cm, and containing microcalcifications.  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 Estrogen receptors were expressed with 100% positivity, same for progesterone receptors, but there was no HER-2 amplification, with the ratio by CISH being 0.96. The MIB-1 was 96%.  With this information the patient underwent bilateral breast MRIs on 12/12/2010, showing a solitary 1.4 cm irregular enhancing mass in the right breast, but no other suspicious masses and no associated adenopathy.  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 Margins were negative. Repeat HER-2 was again negative.   Her subsequent history is as detailed below.  INTERVAL HISTORY:  [name] returns today for followup of her estrogen receptor positive breast cancer. The interval history is generally unremarkable. She continues on tamoxifen, which she obtains had a very good price. Hot flashes and vaginal wetness are not an issue.  She has not had any postmenopausal bleeding issues.   REVIEW OF SYSTEMS:  [name] has had a little bit of "bronchitis" for the last 3 days. She was seen in [name]' office today by [name] and started on prednisone and some cough suppressants. She denies shortness of breath, pleurisy, hemoptysis, purulent sputum, or fever. She has some hearing loss, problems with her dentures, and needs new glasses she thinks. Her heart murmur and other heart issues are followed by [name]. Aside from this a detailed review of systems today was noncontributory  PAST MEDICAL HISTORY: Past Medical History  Diagnosis Date  . Elevated cholesterol   . GERD (gastroesophageal reflux disease)   . Thyroid disease   . Hypertension   . Coronary artery disease     stent 2007  . Depression   . Diabetes mellitus   . Hypothyroidism   . Breast cancer ([place]) 12/05/10    R breast, inv mammary, in situ,ER/PR +,HER2 -  . Arthritis   . Carcinoma of breast treated with adjuvant chemotherapy ([place])   . Heart murmur     secondary to chem. 2013  . Anemia     childhood  . Osteoarthritis   . HSV-1 (herpes simplex virus 1) infection   . Hyperlipidemia   . Anxiety   . Diverticulosis 12/10/03  . 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 Cancer ([place])   . Pneumonia     2014 september  . 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 Difficulty sleeping   . Spinal stenosis    PAST SURGICAL HISTORY: Past Surgical History  Procedure Laterality Date  . Cholecystectomy    .  Knee surgery  2007  . Carotid stent  2007  . Carpal tunnel release Bilateral 9/12,8,12  . Dilation and curettage of uterus    . Coronary angioplasty    . Foot spurs    . Breast lumpectomy  1983    benign biopsy  . Breast lumpectomy Right 12/29/2010    snbx, ER/PR +, Her2 -, 0/1 node pos.  . Portacath placement  02/07/2011    Procedure: INSERTION PORT-A-CATH;  Surgeon: [name], MD;  Location: [facility];  Service: General;   Laterality: N/A;  . Port-a-cath removal  04/17/2011    Procedure: REMOVAL PORT-A-CATH;  Surgeon: [name], MD;  Location: [facility];  Service: General;  Laterality: Left;  . Hysteroscopy w/d&c N/A 09/11/2012    Procedure: DILATATION AND CURETTAGE ;  Surgeon: [name], MD;  Location: [facility];  Service: Gynecology;  Laterality: N/A;  . Esophagogastroduodenoscopy N/A 02/09/2013    Procedure: ESOPHAGOGASTRODUODENOSCOPY (EGD);  Surgeon: [name], MD;  Location: [name];  Service: Endoscopy;  Laterality: N/A;  . Colonoscopy N/A 02/09/2013    Procedure: COLONOSCOPY;  Surgeon: [name], MD;  Location: [facility];  Service: Endoscopy;  Laterality: N/A;  . Lumbar laminectomy/decompression microdiscectomy N/A 10/20/2014    Procedure: MICRO LUMBER DECOMPRESSION L3-4 L4-5;  Surgeon: [name], MD;  Location: [facility];  Service: Orthopedics;  Laterality: N/A;    FAMILY HISTORY Family History  Problem Relation Age of Onset  . Cancer Son   . Throat cancer Father   . Lymphoma Brother 52  . Colon cancer Neg Hx   . Heart disease Mother   . Alcoholism Father   . Heart attack Father   . Heart attack Brother   . Diabetes Brother   . Hypertension Brother   . Stroke Neg Hx   The patient's father died at the age of 37 from cancer "in his temples". The patient's mother died from renal failure at the age of 42. The patient had 3 brothers, one of whom died at the age of 64 from non-Hodgkin's lymphoma. She has one sister. There is no history of breast or ovarian cancer in the family to her knowledge.    GYNECOLOGIC HISTORY: GX P3. Menarche age 54, last menstrual period in her mid 75s.She used hormone replacement for about 10 years, discontinuing that in 2002.   SOCIAL HISTORY: (Updated 12/22/2012) She is a retired Radiation protection practitioner. Her husband [name] is a retired Quarry manager. One daughter died from suicide at the age of 40 in the setting of postpartum depression. A second  daughter, [name], works in [place] as a Art therapist. Son [name] 36 is a Quarry manager in [place]. The patient has 7 grandchildren. She attends a [employer].    ADVANCED DIRECTIVES: in place  HEALTH MAINTENANCE:  (Updated 12/22/2012) Social History  Substance Use Topics  . Smoking status: Former Smoker -- 2.00 packs/day for 40 years    Quit date: 12/19/1997  . Smokeless tobacco: Never Used  . Alcohol Use: 3.6 oz/week    6 Glasses of wine per week     Comment: occasional    Cholesterol -- under treatment/[name]  Bone density:  Nov 2012 at [facility], "normal"  Colonoscopy:  UTD/[facility] PAP:  November 2014, [facility]    Allergies  Allergen Reactions  . Codeine Nausea And Vomiting and Other (See Comments)    Severe stomach cramps  . Antihistamines, Diphenhydramine-Type Other (See Comments)    Causes hyperactivity  . Statins Other (  See Comments)    Joint pains  . Erythromycin Hives and Rash    Due to dental work about 50 years ago. Was used in packing and resulted in rash/hives inside and outside of mouth.    Current Outpatient Prescriptions  Medication Sig Dispense Refill  . aspirin 81 MG tablet Take 1 tablet (81 mg total) by mouth daily. 30 tablet   . carvedilol (COREG) 12.5 MG tablet Take 6.25 mg by mouth 2 (two) times daily with a meal.     . colesevelam (WELCHOL) 625 MG tablet 625 mg. Take 4 tablets by mouth at  dinner    . ibuprofen (ADVIL) 200 MG tablet Take 3 tablets (600 mg total) by mouth every 6 (six) hours as needed. Resume 5 days post-op 30 tablet 0  . levothyroxine (SYNTHROID, LEVOTHROID) 50 MCG tablet Take 50 mcg by mouth daily.    . metFORMIN (GLUCOPHAGE) 1000 MG tablet Take 500 mg by mouth 3 (three) times daily.     . nitroGLYCERIN (NITROSTAT) 0.4 MG SL tablet Place 1 tablet (0.4 mg total) under the tongue every 5 (five) minutes as needed for chest pain. 25 tablet 3  . pantoprazole (PROTONIX) 40 MG tablet Take 40 mg by mouth daily.     Marland Kitchen PARoxetine (PAXIL) 20 MG tablet Take 20 mg by mouth every morning.    . sitaGLIPtin (JANUVIA) 100 MG tablet Take 100 mg by mouth daily.    . valACYclovir (VALTREX) 1000 MG tablet 1,000 mg 2 (two) times daily as needed (for viral infection).      No current facility-administered medications for this visit.    OBJECTIVE: Middle-aged white woman in no acute distress  Filed Vitals:   02/21/15 1246  BP: 156/74  Pulse: 88  Temp: 98 F (36.7 C)  Resp: 18     Body mass index is 26.63 kg/(m^2).    ECOG FS: 1 Filed Weights   02/21/15 1246  Weight: 150 lb 4.8 oz (68.176 kg)   Sclerae unicteric, pupils round and equal Oropharynx clear and moist-- no thrush or other lesions No cervical or supraclavicular adenopathy Lungs no rales or rhonchi, no wheezes appreciated Heart regular rate and rhythm Abd soft, nontender, positive bowel sounds MSK no focal spinal tenderness, no upper extremity lymphedema Neuro: nonfocal, well oriented, appropriate affect Breasts: The right breast is status post lumpectomy and radiation. The cosmetic result is excellent. There is no evidence of disease recurrence. The right axilla is benign per the left breast is unremarkable.  LAB RESULTS: Lab Results  Component Value Date   WBC 11.7* 02/21/2015   NEUTROABS 8.2* 02/21/2015   HGB 11.8 02/21/2015   HCT 36.7 02/21/2015   MCV 89.3 02/21/2015   PLT 234 02/21/2015      Chemistry      Component Value Date/Time   NA 133* 10/26/2014 1836   NA 139 02/10/2014 1004   K 4.8 10/26/2014 1836   K 4.4 02/10/2014 1004   CL 97* 10/26/2014 1836   CL 106 04/10/2012 0921   CO2 26 10/26/2014 1836   CO2 24 02/10/2014 1004   BUN 17 10/26/2014 1836   BUN 19.5 02/10/2014 1004   CREATININE 1.13* 10/26/2014 1836   CREATININE 1.3* 02/10/2014 1004      Component Value Date/Time   CALCIUM 9.3 10/26/2014 1836   CALCIUM 8.3* 02/10/2014 1004   ALKPHOS 68 10/26/2014 1836   ALKPHOS 60 02/10/2014 1004   AST 19 10/26/2014  1836   AST 18 02/10/2014 1004  ALT 16 10/26/2014 1836   ALT 18 02/10/2014 1004   BILITOT 0.4 10/26/2014 1836   BILITOT 0.34 02/10/2014 1004        STUDIES: CLINICAL DATA: Right lumpectomy 2012 for annual mammogram today  EXAM: DIGITAL DIAGNOSTIC BILATERAL MAMMOGRAM WITH 3D TOMOSYNTHESIS AND CAD  COMPARISON: Previous exam(s).  ACR Breast Density Category c: The breast tissue is heterogeneously dense, which may obscure small masses.  FINDINGS: Postsurgical scar far posterior central right breast stable. No suspicious associated findings. Stable scattered benign vascular and dystrophic calcifications.  Mammographic images were processed with CAD.  IMPRESSION: Benign postoperative appearing  RECOMMENDATION: Diagnostic bilateral mammogram in 1 year  I have discussed the findings and recommendations with the patient. Results were also provided in writing at the conclusion of the visit. If applicable, a reminder letter will be sent to the patient regarding the next appointment.  BI-RADS CATEGORY 2: Benign.   Electronically Signed  By: Skipper Cliche M.D.  On: 12/20/2014 11:01   ASSESSMENT:74 y.o.  [place] woman   (1) status post right lumpectomy and sentinel lymph node biopsy November of 2012 for a pT1c pN0, Stage IA invasive ductal carcinoma, grade 3, strongly estrogen and progesterone receptor positive, HER-2 not amplified, with an MIB-1 of 96%. Her Oncotype recurrence score predicted a 14% risk of distant recurrence if her only systemic adjuvant therapy is tamoxifen for five years.   (2) status post two cycles of adjuvant docetaxel/cyclophosphamide, completed February 2013, the final two cycles omitted because of poor tolerance  (3) status post radiation completed April 2013  (4)  on anastrozole briefly in May 2013, discontinued secondary to side effects  (5)  on letrozole briefly in early August 2013, discontinued secondary to side effects  which included nausea and headaches  (6)  on exemestane between September and November 2013, discontinued doe to arthralgias  (7)  started on tamoxifen beginning in December 2013. The plan is to continue tamoxifen for total of 5 years, until December 2018  PLAN:  Leslyn is tolerating tamoxifen much better than she tolerated the aromatase inhibitors and the plan is to continue tamoxifen through December 2019.  She is can do see is a year from now, with lab work and physical exam, after her November mammogram, and then she will see me again in January 2019, at which point she will "graduate".  She knows to call for any problems that may develop before her next visit here. Chauncey Cruel, [place] [phone] 02/21/2015

## 2015-02-21 NOTE — Telephone Encounter (Signed)
Appointments made and avs printed °

## 2015-05-04 DIAGNOSIS — I350 Nonrheumatic aortic (valve) stenosis: Secondary | ICD-10-CM

## 2015-05-25 NOTE — Progress Notes (Signed)
Patient ID: LANA PUERTA, female   DOB: 1940/11/27, 74 y.o.   MRN: [id number]    05/26/2015 [name]   [birth year]  [id number]  Primary Physician [name], MD Primary Cardiologist: Dr Johnsie Cancel  HPI:  75 y.o.  female with a history of CAD, s/p RCA HSRA and DES (Taxus) in 2007. Cath in 2008 showed patent RCA and no significant Lt system disease. She has done well from a cardiac standpoint. She had breast cancer in 2012 and has not had recurrence. She had back surgery in Sept 2016 and has done well. She is in the office today for a 6 month check up. She denies any palpitations, dyspnea, or chest pain.   Echo 01/04/14 mild AS Study Conclusions  - Left ventricle: The cavity size was normal. Wall thickness was increased in a pattern of mild LVH. Systolic function was normal. The estimated ejection fraction was in the range of 60% to 65%. Wall motion was normal; there were no regional wall motion abnormalities. Doppler parameters are consistent with abnormal left ventricular relaxation (grade 1 diastolic dysfunction). - Aortic valve: There was mild stenosis. Mean gradient (S): 17 mm Hg. Peak gradient (S): 26 mm Hg. - Mitral valve: There was no significant regurgitation. - Right ventricle: The cavity size was normal. Systolic function was normal. - Pulmonary arteries: No complete TR doppler jet so unable to estimate PA systolic pressure. - Inferior vena cava: The vessel was normal in size. The respirophasic diameter changes were in the normal range (= 50%), consistent with normal central venous pressure. - Pericardium, extracardiac: A trivial pericardial effusion was identified posterior to the heart.  Multiple complaints today.  Leg pain Does not sound like claudication has spinal stenosis Seein Bean. Getting epidural latter this month BP labile with ? dizzyness Fatigue and some dyspnea   Current Outpatient Prescriptions  Medication Sig Dispense  Refill  . aspirin 81 MG tablet Take 1 tablet (81 mg total) by mouth daily. 30 tablet   . canagliflozin (INVOKANA) 100 MG TABS tablet Take 100 mg by mouth daily before breakfast.    . carvedilol (COREG) 12.5 MG tablet Take 6.25 mg by mouth 2 (two) times daily with a meal.     . colesevelam (WELCHOL) 625 MG tablet Take 2,500 mg by mouth daily.    Marland Kitchen ibuprofen (ADVIL,MOTRIN) 200 MG tablet Take 200 mg by mouth every 6 (six) hours as needed for mild pain or moderate pain.    Marland Kitchen levothyroxine (SYNTHROID, LEVOTHROID) 50 MCG tablet Take 50 mcg by mouth daily.    Marland Kitchen lisinopril (PRINIVIL,ZESTRIL) 5 MG tablet Take 5 mg by mouth daily.    . metFORMIN (GLUCOPHAGE) 1000 MG tablet Take 500 mg by mouth 3 (three) times daily.     . nitroGLYCERIN (NITROSTAT) 0.4 MG SL tablet Place 1 tablet (0.4 mg total) under the tongue every 5 (five) minutes as needed for chest pain. 25 tablet 3  . pantoprazole (PROTONIX) 40 MG tablet Take 40 mg by mouth daily.    Marland Kitchen PARoxetine (PAXIL) 20 MG tablet Take 20 mg by mouth every morning.    . tamoxifen (NOLVADEX) 20 MG tablet Take 1 tablet by mouth  daily 90 tablet 3  . valACYclovir (VALTREX) 1000 MG tablet Take 1,000 mg by mouth 2 (two) times daily as needed (for viral infection).     No current facility-administered medications for this visit.    Allergies  Allergen Reactions  . Codeine Nausea And Vomiting and Other (See Comments)  Severe stomach cramps  . Antihistamines, Diphenhydramine-Type Other (See Comments)    Causes hyperactivity  . Statins Other (See Comments)    Joint pains  . Erythromycin Hives and Rash    Due to dental work about 50 years ago. Was used in packing and resulted in rash/hives inside and outside of mouth.    Social History   Social History  . Marital Status: Married    Spouse Name: N/A  . Number of Children: N/A  . Years of Education: N/A   Occupational History  . retired    Social History Main Topics  . Smoking status: Former Smoker --  2.00 packs/day for 40 years    Quit date: 12/19/1997  . Smokeless tobacco: Never Used  . Alcohol Use: 3.6 oz/week    6 Glasses of wine per week     Comment: occasional  . Drug Use: No  . Sexual Activity: Not on file     Comment: menarch age 59, P3, HRT X 10 YRS, MENOPAUSE MID 40'S   Other Topics Concern  . Not on file   Social History Narrative   [name] age 55- [name] age 89       Review of Systems: General: negative for chills, fever, night sweats or weight changes.  Cardiovascular: negative for chest pain, dyspnea on exertion, edema, orthopnea, palpitations, paroxysmal nocturnal dyspnea or shortness of breath Dermatological: negative for rash Respiratory: negative for cough or wheezing Urologic: negative for hematuria Abdominal: negative for nausea, vomiting, diarrhea, bright red blood per rectum, melena, or hematemesis Neurologic: negative for visual changes, syncope, or dizziness All other systems reviewed and are otherwise negative except as noted above.    Blood pressure 96/50, pulse 104, height 5\' 3"  (1.6 m), weight 66.28 kg (146 lb 1.9 oz), SpO2 96 %.  General appearance: alert, cooperative and no distress Neck: no carotid bruit and no JVD Lungs: clear to auscultation bilaterally Heart: regular rate and rhythm and 2/6 systolic murmur AOV, preserved S2 Extremities: extremities normal, atraumatic, no cyanosis or edema Skin: Skin color, texture, turgor normal. No rashes or lesions Neurologic: Grossly normal   ECG:  12/27/14 SR rate 91 normal   ASSESSMENT AND PLAN:   CAD S/P PCI 2007 RCA HSRA and Taxus stent in 2007 by Dr Lia Foyer.  Re look cath in 2008 OK, no Lt system disease and RCA patent  Bruit:  F/u carotid duplex   Essential hypertension Labile low in office and at home Hold lisinopril and re evaluate  Elevated lipids On Welchol, statin intolerant- LDL was 83 in 2010, followed by PCP  Mild aortic stenosis Echo Dec 2015 mean  gradient 17 mmHg f/u echo not severe on exam  Doubt related to fatigue and dyspnea   History of breast cancer Rt mastectomy Dec 2012 followed by chemo and radiation  Spinal stenosis of lumbar region Surgery Sept 2016 suspect her leg pain is from this will check ABI's and LE arterial duplex for epidural injection latter in month  ABI"s were normal in Sept 2016   F/u with me after testing next available   [name]

## 2015-05-26 ENCOUNTER — Ambulatory Visit (INDEPENDENT_AMBULATORY_CARE_PROVIDER_SITE_OTHER): Payer: Medicare Other | Admitting: Cardiovascular Disease

## 2015-05-26 ENCOUNTER — Encounter: Payer: Self-pay | Admitting: Cardiovascular Disease

## 2015-05-26 VITALS — BP 96/50 | HR 104 | Ht 63.0 in | Wt 146.1 lb

## 2015-05-26 DIAGNOSIS — I10 Essential (primary) hypertension: Secondary | ICD-10-CM

## 2015-05-26 DIAGNOSIS — I350 Nonrheumatic aortic (valve) stenosis: Secondary | ICD-10-CM

## 2015-05-26 DIAGNOSIS — R0989 Other specified symptoms and signs involving the circulatory and respiratory systems: Secondary | ICD-10-CM | POA: Diagnosis not present

## 2015-05-26 DIAGNOSIS — Z9861 Coronary angioplasty status: Secondary | ICD-10-CM

## 2015-05-26 DIAGNOSIS — R011 Cardiac murmur, unspecified: Secondary | ICD-10-CM | POA: Diagnosis not present

## 2015-05-26 DIAGNOSIS — I2510 Atherosclerotic heart disease of native coronary artery without angina pectoris: Secondary | ICD-10-CM

## 2015-05-26 DIAGNOSIS — E785 Hyperlipidemia, unspecified: Secondary | ICD-10-CM

## 2015-05-26 NOTE — Patient Instructions (Addendum)
Medication Instructions:  Your physician has recommended you make the following change in your medication:  1-STOP Lisinopril    Lab work: NONE  Testing/Procedures: Your physician has requested that you have a lower extremity arterial exercise duplex. During this test, exercise and ultrasound are used to evaluate arterial blood flow in the legs. Allow one hour for this exam. There are no restrictions or special instructions.  Your physician has requested that you have an echocardiogram. Echocardiography is a painless test that uses sound waves to create images of your heart. It provides your doctor with information about the size and shape of your heart and how well your heart's chambers and valves are working. This procedure takes approximately one hour. There are no restrictions for this procedure.  Your physician has requested that you have a carotid duplex. This test is an ultrasound of the carotid arteries in your neck. It looks at blood flow through these arteries that supply the brain with blood. Allow one hour for this exam. There are no restrictions or special instructions.   Follow-Up: Your physician wants you to follow-up next available with Dr. Johnsie Cancel.  If you need a refill on your cardiac medications before your next appointment, please call your pharmacy.

## 2015-06-06 ENCOUNTER — Other Ambulatory Visit: Payer: Self-pay | Admitting: Cardiovascular Disease

## 2015-06-06 DIAGNOSIS — I10 Essential (primary) hypertension: Secondary | ICD-10-CM

## 2015-06-06 DIAGNOSIS — I2510 Atherosclerotic heart disease of native coronary artery without angina pectoris: Secondary | ICD-10-CM

## 2015-06-06 DIAGNOSIS — I739 Peripheral vascular disease, unspecified: Secondary | ICD-10-CM

## 2015-06-06 DIAGNOSIS — Z9861 Coronary angioplasty status: Secondary | ICD-10-CM

## 2015-06-06 DIAGNOSIS — E785 Hyperlipidemia, unspecified: Secondary | ICD-10-CM

## 2015-06-13 ENCOUNTER — Ambulatory Visit (HOSPITAL_COMMUNITY)
Admission: RE | Admit: 2015-06-13 | Discharge: 2015-06-13 | Disposition: A | Discharge status: Home / Self Care | Payer: Medicare Other | Source: Ambulatory Visit | Attending: Cardiovascular Disease | Admitting: Cardiovascular Disease

## 2015-06-13 ENCOUNTER — Other Ambulatory Visit: Payer: Self-pay | Admitting: Cardiovascular Disease

## 2015-06-13 ENCOUNTER — Other Ambulatory Visit: Payer: Self-pay

## 2015-06-13 ENCOUNTER — Ambulatory Visit (HOSPITAL_BASED_OUTPATIENT_CLINIC_OR_DEPARTMENT_OTHER): Payer: Medicare Other

## 2015-06-13 DIAGNOSIS — I2510 Atherosclerotic heart disease of native coronary artery without angina pectoris: Secondary | ICD-10-CM

## 2015-06-13 DIAGNOSIS — I6523 Occlusion and stenosis of bilateral carotid arteries: Secondary | ICD-10-CM

## 2015-06-13 DIAGNOSIS — R0989 Other specified symptoms and signs involving the circulatory and respiratory systems: Secondary | ICD-10-CM

## 2015-06-13 DIAGNOSIS — I10 Essential (primary) hypertension: Secondary | ICD-10-CM

## 2015-06-13 DIAGNOSIS — I350 Nonrheumatic aortic (valve) stenosis: Secondary | ICD-10-CM | POA: Insufficient documentation

## 2015-06-13 DIAGNOSIS — I739 Peripheral vascular disease, unspecified: Secondary | ICD-10-CM

## 2015-06-13 DIAGNOSIS — E785 Hyperlipidemia, unspecified: Secondary | ICD-10-CM

## 2015-06-13 DIAGNOSIS — Z9861 Coronary angioplasty status: Secondary | ICD-10-CM

## 2015-06-13 DIAGNOSIS — Z853 Personal history of malignant neoplasm of breast: Secondary | ICD-10-CM | POA: Insufficient documentation

## 2015-06-13 DIAGNOSIS — R011 Cardiac murmur, unspecified: Secondary | ICD-10-CM

## 2015-06-24 ENCOUNTER — Telehealth: Payer: Self-pay | Admitting: Cardiovascular Disease

## 2015-06-24 NOTE — Telephone Encounter (Signed)
6 months is fine

## 2015-06-24 NOTE — Telephone Encounter (Signed)
Patient received test results as shown below. Patient was scheduled for next available (06/30/15) at last office visit (05/26/15), will see if Dr. Johnsie Cancel wants to move this appointment to 6 months or 1 year follow-up.  Echo results-Normal EF mild AS f/u echo in a year LE ultrasound results- Normal circulation Carotid duplex 0000000 LICA stenosis. F/U duplex in one year

## 2015-06-24 NOTE — Telephone Encounter (Signed)
New Message  Pt request a call back to determine if the appt for 06/30/2015 is needed being that she has already received the results from her test. Please call

## 2015-06-24 NOTE — Telephone Encounter (Signed)
Called patient back and informed her that it was fine for her to come back for a follow-up appointment in 6 months. Patient agreed to plan. Patient has recall letter in for 6 month follow-up.

## 2015-06-30 ENCOUNTER — Ambulatory Visit: Payer: Medicare Other | Admitting: Cardiovascular Disease

## 2015-07-03 ENCOUNTER — Other Ambulatory Visit: Payer: Self-pay | Admitting: Oncology

## 2015-08-21 ENCOUNTER — Other Ambulatory Visit: Payer: Self-pay | Admitting: Oncology

## 2015-08-22 ENCOUNTER — Other Ambulatory Visit: Payer: Self-pay | Admitting: *Deleted

## 2015-08-22 DIAGNOSIS — C50511 Malignant neoplasm of lower-outer quadrant of right female breast: Secondary | ICD-10-CM

## 2015-08-22 MED ORDER — TAMOXIFEN CITRATE 20 MG PO TABS: ORAL_TABLET | ORAL | 3 refills | Status: DC

## 2015-09-10 ENCOUNTER — Emergency Department (HOSPITAL_BASED_OUTPATIENT_CLINIC_OR_DEPARTMENT_OTHER)
Admission: EM | Admit: 2015-09-10 | Discharge: 2015-09-10 | Disposition: A | Discharge status: Home / Self Care | Payer: Medicare Other | Attending: Emergency Medicine | Admitting: Emergency Medicine

## 2015-09-10 ENCOUNTER — Emergency Department (HOSPITAL_BASED_OUTPATIENT_CLINIC_OR_DEPARTMENT_OTHER): Payer: Medicare Other

## 2015-09-10 ENCOUNTER — Encounter (HOSPITAL_BASED_OUTPATIENT_CLINIC_OR_DEPARTMENT_OTHER): Payer: Self-pay | Admitting: *Deleted

## 2015-09-10 DIAGNOSIS — K219 Gastro-esophageal reflux disease without esophagitis: Secondary | ICD-10-CM | POA: Diagnosis not present

## 2015-09-10 DIAGNOSIS — R791 Abnormal coagulation profile: Secondary | ICD-10-CM | POA: Diagnosis not present

## 2015-09-10 DIAGNOSIS — I2510 Atherosclerotic heart disease of native coronary artery without angina pectoris: Secondary | ICD-10-CM | POA: Diagnosis not present

## 2015-09-10 DIAGNOSIS — I10 Essential (primary) hypertension: Secondary | ICD-10-CM | POA: Diagnosis not present

## 2015-09-10 DIAGNOSIS — Z7984 Long term (current) use of oral hypoglycemic drugs: Secondary | ICD-10-CM | POA: Diagnosis not present

## 2015-09-10 DIAGNOSIS — E039 Hypothyroidism, unspecified: Secondary | ICD-10-CM | POA: Insufficient documentation

## 2015-09-10 DIAGNOSIS — E119 Type 2 diabetes mellitus without complications: Secondary | ICD-10-CM | POA: Insufficient documentation

## 2015-09-10 DIAGNOSIS — Z853 Personal history of malignant neoplasm of breast: Secondary | ICD-10-CM | POA: Diagnosis not present

## 2015-09-10 DIAGNOSIS — Z7982 Long term (current) use of aspirin: Secondary | ICD-10-CM | POA: Diagnosis not present

## 2015-09-10 DIAGNOSIS — Z79899 Other long term (current) drug therapy: Secondary | ICD-10-CM | POA: Insufficient documentation

## 2015-09-10 DIAGNOSIS — R072 Precordial pain: Secondary | ICD-10-CM | POA: Diagnosis present

## 2015-09-10 DIAGNOSIS — Z87891 Personal history of nicotine dependence: Secondary | ICD-10-CM | POA: Diagnosis not present

## 2015-09-10 DIAGNOSIS — Z791 Long term (current) use of non-steroidal anti-inflammatories (NSAID): Secondary | ICD-10-CM | POA: Diagnosis not present

## 2015-09-10 LAB — COMPREHENSIVE METABOLIC PANEL
ALT: 22 U/L (ref 14–54)
ANION GAP: 10 (ref 5–15)
AST: 26 U/L (ref 15–41)
Albumin: 3.6 g/dL (ref 3.5–5.0)
Alkaline Phosphatase: 60 U/L (ref 38–126)
BUN: 27 mg/dL — ABNORMAL HIGH (ref 6–20)
CHLORIDE: 104 mmol/L (ref 101–111)
CO2: 23 mmol/L (ref 22–32)
Calcium: 10.2 mg/dL (ref 8.9–10.3)
Creatinine, Ser: 1.47 mg/dL — ABNORMAL HIGH (ref 0.44–1.00)
GFR calc non Af Amer: 34 mL/min — ABNORMAL LOW (ref >60)
GFR, EST AFRICAN AMERICAN: 40 mL/min — AB (ref >60)
Glucose, Bld: 177 mg/dL — ABNORMAL HIGH (ref 65–99)
POTASSIUM: 4.2 mmol/L (ref 3.5–5.1)
SODIUM: 137 mmol/L (ref 135–145)
Total Bilirubin: 0.5 mg/dL (ref 0.3–1.2)
Total Protein: 6.8 g/dL (ref 6.5–8.1)

## 2015-09-10 LAB — APTT: APTT: 24 s (ref 24–36)

## 2015-09-10 LAB — TROPONIN I

## 2015-09-10 LAB — CBC
HCT: 37.8 % (ref 36.0–46.0)
Hemoglobin: 12.8 g/dL (ref 12.0–15.0)
MCH: 30.2 pg (ref 26.0–34.0)
MCHC: 33.9 g/dL (ref 30.0–36.0)
MCV: 89.2 fL (ref 78.0–100.0)
PLATELETS: 212 10*3/uL (ref 150–400)
RBC: 4.24 MIL/uL (ref 3.87–5.11)
RDW: 13.3 % (ref 11.5–15.5)
WBC: 6.6 10*3/uL (ref 4.0–10.5)

## 2015-09-10 LAB — PROTIME-INR
INR: 0.88
Prothrombin Time: 11.9 seconds (ref 11.4–15.2)

## 2015-09-10 MED ORDER — SUCRALFATE 1 G PO TABS: 1.0000 g | ORAL_TABLET | Freq: Three times a day (TID) | ORAL | 1 refills | Status: DC

## 2015-09-10 MED ORDER — ASPIRIN 81 MG PO CHEW
324.0000 mg | CHEWABLE_TABLET | Freq: Once | ORAL | Status: AC
  Administered 2015-09-10: 324 mg via ORAL
  Filled 2015-09-10: qty 4

## 2015-09-10 MED ORDER — GI COCKTAIL ~~LOC~~
30.0000 mL | Freq: Once | ORAL | Status: AC
  Administered 2015-09-10: 30 mL via ORAL
  Filled 2015-09-10: qty 30

## 2015-09-10 NOTE — ED Notes (Signed)
Patient transported to X-ray 

## 2015-09-10 NOTE — Discharge Instructions (Signed)
Follow-up with your primary care doctor or schedule an appointment with gastroenterologist as we discussed. Continue your protonix.   Add the Carafate to your  regimen. Return as needed for worsening symptoms

## 2015-09-10 NOTE — ED Triage Notes (Signed)
Pt reports sternal pressure since this past Thursday. Reports pain occasionally goes up towards esophagus, or down towards abd. Denies fever, n/v/d, dizziness, sob. Reports worsens with eating and or swallowing.

## 2015-09-10 NOTE — ED Provider Notes (Signed)
[facility] DEPT [facility] Provider Note   CSN: [id number] Arrival date & time: 09/10/15  4098     History   Chief Complaint Chief Complaint  Patient presents with  . Chest Pain    HPI [name] is a 75 y.o. female.  The history is provided by the patient.  Chest Pain   This is a new problem. The current episode started more than 2 days ago (Symptoms initially started on Thursday). The problem occurs constantly. The problem has been gradually worsening. The pain is associated with eating (Patient notices that every time she tries to eat or swallow she starts getting pain in the middle of her chest. Occasionally will radiate up towards her neck as well as down towards her abdomen.). The pain is present in the substernal region. The pain is moderate. The quality of the pain is described as burning. Exacerbated by: Pain also gets worse when she lies flat. She feels better sitting up when she tries to sleep. Pertinent negatives include no abdominal pain, no cough, no diaphoresis, no dizziness, no exertional chest pressure, no leg pain, no lower extremity edema, no nausea, no shortness of breath, no vomiting and no weakness. She has tried antacids for the symptoms. The treatment provided no relief. Risk factors: She has history of coronary artery disease as well as gastroesophageal reflux. The symptoms do not feel like her heart disease symptoms.  Her past medical history is significant for CAD (she has a stent).    Past Medical History:  Diagnosis Date  . Anemia    childhood  . Anxiety   . Arterial stenosis (HCC)    mesenteric  . Arthritis   . Breast cancer ([place]) 12/05/10   R breast, inv mammary, in situ,ER/PR +,HER2 -  . Cancer ([place])   . Carcinoma of breast treated with adjuvant chemotherapy ([place])   . Coronary artery disease    stent 2007  . Depression   . Diabetes mellitus   . Difficulty sleeping   . Diverticulosis 12/10/03  . Elevated cholesterol   . GERD  (gastroesophageal reflux disease)   . Heart murmur    secondary to chem. 2013  . HSV-1 (herpes simplex virus 1) infection   . Hyperlipidemia   . Hypertension   . Hypothyroidism   . Osteoarthritis   . Pneumonia    2014 september  . 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 Spinal stenosis   . Thyroid disease     Patient Active Problem List   Diagnosis Date Noted  . Spinal stenosis of lumbar region 10/20/2014  . Mild aortic stenosis 12/29/2013  . Dense breasts 12/22/2012  . History of breast cancer 12/22/2012  . Carcinoma of breast treated with adjuvant chemotherapy ([place])   . 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 female breast ([place]) 12/20/2010  . HYPERCHOLESTEROLEMIA 09/14/2008  . DEPRESSION 09/14/2008  . GERD 09/14/2008  . OTITIS MEDIA, SEROUS, ACUTE, RIGHT 05/31/2008  . Type 2 diabetes mellitus ([place]) 05/20/2008  . Elevated lipids 04/17/2007  . Essential hypertension 04/17/2007  . CAD S/P PCI 2007 04/17/2007  . ARTHRALGIA 04/17/2007  . HYPOTHYROIDISM 04/03/2007  . OTHER ABNORMAL BLOOD CHEMISTRY 12/03/2006    Past Surgical History:  Procedure Laterality Date  . BREAST LUMPECTOMY  1983   benign biopsy  . BREAST LUMPECTOMY Right 12/29/2010   snbx, ER/PR +, Her2 -, 0/1 node pos.  . CAROTID STENT  2007  . CARPAL TUNNEL RELEASE Bilateral 9/12,8,12  . CHOLECYSTECTOMY    .  COLONOSCOPY N/A 02/09/2013   Procedure: COLONOSCOPY;  Surgeon: [name], MD;  Location: [facility];  Service: Endoscopy;  Laterality: N/A;  . CORONARY ANGIOPLASTY    . DILATION AND CURETTAGE OF UTERUS    . ESOPHAGOGASTRODUODENOSCOPY N/A 02/09/2013   Procedure: ESOPHAGOGASTRODUODENOSCOPY (EGD);  Surgeon: [name], MD;  Location: [name];  Service: Endoscopy;  Laterality: N/A;  . FOOT SPURS    . HYSTEROSCOPY W/D&C N/A 09/11/2012   Procedure: DILATATION AND CURETTAGE ;  Surgeon: [name], MD;  Location: [facility];  Service: Gynecology;  Laterality: N/A;  . KNEE  SURGERY  2007  . LUMBAR LAMINECTOMY/DECOMPRESSION MICRODISCECTOMY N/A 10/20/2014   Procedure: MICRO LUMBER DECOMPRESSION L3-4 L4-5;  Surgeon: [name], MD;  Location: [facility];  Service: Orthopedics;  Laterality: N/A;  . PORT-A-CATH REMOVAL  04/17/2011   Procedure: REMOVAL PORT-A-CATH;  Surgeon: [name], MD;  Location: [facility];  Service: General;  Laterality: Left;  . PORTACATH PLACEMENT  02/07/2011   Procedure: INSERTION PORT-A-CATH;  Surgeon: [name], MD;  Location: [facility];  Service: General;  Laterality: N/A;    OB History    Gravida Para Term Preterm AB Living   3 3           SAB TAB Ectopic Multiple Live Births                  Obstetric Comments   MENARCHE AGE 59, MENOPAUSE MID 40'S, HRT X 10 YRS, D/C 2002       Home Medications    Prior to Admission medications   Medication Sig Start Date End Date Taking? Authorizing Provider  aspirin 81 MG tablet Take 1 tablet (81 mg total) by mouth daily. 10/21/14  Yes Cecilie Kicks, PA-C  colesevelam (WELCHOL) 625 MG tablet Take 2,500 mg by mouth daily.   Yes Historical Provider, MD  glipiZIDE (GLUCOTROL XL) 2.5 MG 24 hr tablet Take 2.5 mg by mouth daily with breakfast.   Yes Historical Provider, MD  levothyroxine (SYNTHROID, LEVOTHROID) 50 MCG tablet Take 50 mcg by mouth daily.   Yes Historical Provider, MD  lisinopril (PRINIVIL,ZESTRIL) 2.5 MG tablet Take 2.5 mg by mouth daily.   Yes Historical Provider, MD  metFORMIN (GLUCOPHAGE) 500 MG tablet Take 500 mg by mouth 2 (two) times daily with a meal.   Yes Historical Provider, MD  nitroGLYCERIN (NITROSTAT) 0.4 MG SL tablet Place 1 tablet (0.4 mg total) under the tongue every 5 (five) minutes as needed for chest pain. 07/02/13  Yes Josue Hector, MD  Omega-3 Fatty Acids (FISH OIL) 1000 MG CAPS Take 1,000 mg by mouth daily.   Yes Historical Provider, MD  pantoprazole (PROTONIX) 40 MG tablet Take 40 mg by mouth daily.   Yes Historical Provider, MD    PARoxetine (PAXIL) 20 MG tablet Take 20 mg by mouth every morning.   Yes Historical Provider, MD  sitaGLIPtin (JANUVIA) 100 MG tablet Take 100 mg by mouth daily.   Yes Historical Provider, MD  tamoxifen (NOLVADEX) 20 MG tablet Take 1 tablet by mouth  daily 08/22/15  Yes Chauncey Cruel, MD  valACYclovir (VALTREX) 1000 MG tablet Take 1,000 mg by mouth 2 (two) times daily as needed (for viral infection).   Yes Historical Provider, MD  canagliflozin (INVOKANA) 100 MG TABS tablet Take 100 mg by mouth daily before breakfast.    Historical Provider, MD  carvedilol (COREG) 12.5 MG tablet Take 6.25 mg by mouth 2 (two) times daily with a meal.  Historical Provider, MD  ibuprofen (ADVIL,MOTRIN) 200 MG tablet Take 200 mg by mouth every 6 (six) hours as needed for mild pain or moderate pain.    Historical Provider, MD  sucralfate (CARAFATE) 1 g tablet Take 1 tablet (1 g total) by mouth 4 (four) times daily -  with meals and at bedtime. 09/10/15   Dorie Rank, MD    Family History Family History  Problem Relation Age of Onset  . Throat cancer Father   . Alcoholism Father   . Heart attack Father   . Heart disease Mother   . Lymphoma Brother 69  . Cancer Son   . Heart attack Brother   . Diabetes Brother   . Hypertension Brother   . Colon cancer Neg Hx   . Stroke Neg Hx     Social History Social History  Substance Use Topics  . Smoking status: Former Smoker    Packs/day: 2.00    Years: 40.00    Quit date: 12/19/1997  . Smokeless tobacco: Never Used  . Alcohol use 3.6 oz/week    6 Glasses of wine per week     Comment: occasional     Allergies   Codeine; Antihistamines, diphenhydramine-type; Statins; and Erythromycin   Review of Systems Review of Systems  Constitutional: Negative for diaphoresis.  Respiratory: Negative for cough and shortness of breath.   Cardiovascular: Positive for chest pain.  Gastrointestinal: Negative for abdominal pain, nausea and vomiting.  Neurological:  Negative for dizziness and weakness.  All other systems reviewed and are negative.    Physical Exam Updated Vital Signs BP 137/80   Pulse 98   Temp 98.2 F (36.8 C) (Oral)   Resp 18   Ht '5\' 3"'  (1.6 m)   Wt 66.2 kg   SpO2 95%   BMI 25.86 kg/m   Physical Exam  Constitutional: She appears well-developed and well-nourished. No distress.  HENT:  Head: Normocephalic and atraumatic.  Right Ear: External ear normal.  Left Ear: External ear normal.  Eyes: Conjunctivae are normal. Right eye exhibits no discharge. Left eye exhibits no discharge. No scleral icterus.  Neck: Neck supple. No tracheal deviation present.  Cardiovascular: Normal rate, regular rhythm and intact distal pulses.   Pulmonary/Chest: Effort normal and breath sounds normal. No stridor. No respiratory distress. She has no wheezes. She has no rales.  Abdominal: Soft. Bowel sounds are normal. She exhibits no distension. There is no tenderness. There is no rebound and no guarding.  Musculoskeletal: She exhibits no edema or tenderness.  Neurological: She is alert. She has normal strength. No cranial nerve deficit (no facial droop, extraocular movements intact, no slurred speech) or sensory deficit. She exhibits normal muscle tone. She displays no seizure activity. Coordination normal.  Skin: Skin is warm and dry. No rash noted.  Psychiatric: She has a normal mood and affect.  Nursing note and vitals reviewed.    ED Treatments / Results  Labs (all labs ordered are listed, but only abnormal results are displayed) Labs Reviewed  COMPREHENSIVE METABOLIC PANEL - Abnormal; Notable for the following:       Result Value   Glucose, Bld 177 (*)    BUN 27 (*)    Creatinine, Ser 1.47 (*)    GFR calc non Af Amer 34 (*)    GFR calc Af Amer 40 (*)    All other components within normal limits  APTT  CBC  PROTIME-INR  TROPONIN I  TROPONIN I    EKG  EKG Interpretation  Date/Time:  Saturday September 10 2015 10:14:04  EDT Ventricular Rate:  100 PR Interval:    QRS Duration: 82 QT Interval:  359 QTC Calculation: 463 R Axis:   71 Text Interpretation:  Sinus tachycardia Borderline T wave abnormalities t wave changes more notable in the inferior leads compared to prior tracing Confirmed by Anis Cinelli  MD-J, Dayna Alia ([id number]) on 09/10/2015 10:26:38 AM Also confirmed by Jadalynn Burr  MD-J, Leeba Barbe [phone]), editor Lorenda Cahill CT, Leda Gauze [phone])  on 09/10/2015 12:02:40 PM       Radiology Dg Chest 2 View  Result Date: 09/10/2015 CLINICAL DATA:  Initial encounter. 75 y/o female with c/o sternal CP onset Thursday, sometimes radiates to up and down, and to the LEFT. No other complaints. Hx HTN - on meds, DM, former smoker, heart stent, pna, bronchitis EXAM: CHEST  2 VIEW COMPARISON:  02/07/2011 FINDINGS: Lungs are hyperexpanded.  Lungs are clear. No pleural effusion or pneumothorax. Cardiac silhouette is normal in size. No mediastinal or hilar masses or evidence of adenopathy. Bony thorax is demineralized but intact. IMPRESSION: No acute cardiopulmonary disease. Electronically Signed   By: Lajean Manes M.D.   On: 09/10/2015 11:03    Procedures Procedures (including critical care time)  Medications Ordered in ED Medications  aspirin chewable tablet 324 mg (324 mg Oral Given 09/10/15 1058)  gi cocktail (Maalox,Lidocaine,Donnatal) (30 mLs Oral Given 09/10/15 1058)     Initial Impression / Assessment and Plan / ED Course  I have reviewed the triage vital signs and the nursing notes.  Pertinent labs & imaging results that were available during my care of the patient were reviewed by me and considered in my medical decision making (see chart for details).  Clinical Course  Comment By Time  Updated pt on lab tests.  First trop normal Dorie Rank, MD 08/19 1200  Pain better after the gi cocktail.  Waiting for 2nd troponin Dorie Rank, MD 08/19 1222    Patient presented to the emergency room with chest pain that started several days ago. She  does have history of coronary artery disease but her symptoms were very much related to eating and swallowing. She does have a history of GERD and takes Protonix. Patient's evaluation in the ED is reassuring. She's had 2 sets of cardiac enzymes that are normal. Her symptoms improved with a GI cocktail. I doubt that her symptoms are related to acute coronary syndrome. I doubt pulmonary embolism or pneumonia. I doubt aortic dissection. Plan on discharge home with a prescription for Carafate. Follow up with her primary care doctor or a gastroenterologist.  Final Clinical Impressions(s) / ED Diagnoses   Final diagnoses:  Gastroesophageal reflux disease, esophagitis presence not specified    New Prescriptions New Prescriptions   SUCRALFATE (CARAFATE) 1 G TABLET    Take 1 tablet (1 g total) by mouth 4 (four) times daily -  with meals and at bedtime.     Dorie Rank, MD 09/10/15 1444

## 2015-09-23 ENCOUNTER — Ambulatory Visit (INDEPENDENT_AMBULATORY_CARE_PROVIDER_SITE_OTHER): Payer: Medicare Other | Admitting: Gastroenterology

## 2015-09-23 ENCOUNTER — Encounter: Payer: Self-pay | Admitting: Gastroenterology

## 2015-09-23 VITALS — BP 134/72 | HR 74 | Ht 63.0 in | Wt 152.2 lb

## 2015-09-23 DIAGNOSIS — K219 Gastro-esophageal reflux disease without esophagitis: Secondary | ICD-10-CM

## 2015-09-23 DIAGNOSIS — R1310 Dysphagia, unspecified: Secondary | ICD-10-CM

## 2015-09-23 NOTE — Progress Notes (Signed)
HPI :  75 y/o female here for a follow up visit. Previously followed by Dr. Olevia Perches and new to me. She has a history of CAD, with DES in the RCA in 2007. She has a history of breast cancer in 2012 without recurrence. She has also been followed by Dr. Olevia Perches for IBS and GERD.  She was seen in the ER on August 19th with chest discomfort that was worse with eating. She had negative cardiac enzymes and symptoms had improved with PPI. She reported having some "burning in her chest" with taking PO which became worse over time. She reported taking GI cocktail took it away and symptoms have not recurred. She takes protonix once daily. SHe reports this normally controls her heartburn pretty well and takes it daily for a long time. SHe will have some recurrence of symptoms if she misses dosing. No further symptoms since her visit to the ER. She was given carafate to take but she did not start it.   She denies any heartburn otherwise. She denies odynophagia. She has had some new dysphagia in the past few weeks. She has dysphagia to both liquids and solids which is new. She has dysphagia with most swallows. Contents get caught up in her upper chest and then goes through eventually. She denies any exertional symptoms but states she has spinal stenosis and moves slowly. She  denies any dyspnea. Last seen by cardiology in May.   She has otherwise been following with her primary care regarding mild renal insufficiency. that has been noted.   Colonoscopy 02/09/2013 - mild diverticulosis, small hemorrhoids EGD 02/09/2013 - normal esophagus and GEJ   Past Medical History:  Diagnosis Date  . Anemia    childhood  . Anxiety   . Arterial stenosis (HCC)    mesenteric  . Arthritis   . Breast cancer ([place]) 12/05/10   R breast, inv mammary, in situ,ER/PR +,HER2 -  . Cancer ([place])   . Carcinoma of breast treated with adjuvant chemotherapy ([place])   . Coronary artery disease    stent 2007  . Depression   . Diabetes  mellitus   . Difficulty sleeping   . Diverticulosis 12/10/03  . Elevated cholesterol   . GERD (gastroesophageal reflux disease)   . Heart murmur    secondary to chem. 2013  . HSV-1 (herpes simplex virus 1) infection   . Hyperlipidemia   . Hypertension   . Hypothyroidism   . Osteoarthritis   . Pneumonia    2014 september  . 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 Spinal stenosis   . Thyroid disease      Past Surgical History:  Procedure Laterality Date  . BREAST LUMPECTOMY  1983   benign biopsy  . BREAST LUMPECTOMY Right 12/29/2010   snbx, ER/PR +, Her2 -, 0/1 node pos.  . CAROTID STENT  2007  . CARPAL TUNNEL RELEASE Bilateral 9/12,8,12  . CHOLECYSTECTOMY    . COLONOSCOPY N/A 02/09/2013   Procedure: COLONOSCOPY;  Surgeon: [name], MD;  [facility];  Service: Endoscopy;  Laterality: N/A;  . CORONARY ANGIOPLASTY    . DILATION AND CURETTAGE OF UTERUS    . ESOPHAGOGASTRODUODENOSCOPY N/A 02/09/2013   Procedure: ESOPHAGOGASTRODUODENOSCOPY (EGD);  Surgeon: [name], MD;  Location: [name];  Service: Endoscopy;  Laterality: N/A;  . FOOT SPURS    . HYSTEROSCOPY W/D&C N/A 09/11/2012   Procedure: DILATATION AND CURETTAGE ;  Surgeon: [name], MD;  Location: [facility];  Service: Gynecology;  Laterality: N/A;  . KNEE SURGERY  2007  . LUMBAR LAMINECTOMY/DECOMPRESSION MICRODISCECTOMY N/A 10/20/2014   Procedure: MICRO LUMBER DECOMPRESSION L3-4 L4-5;  Surgeon: [name], MD;  [facility];  Service: Orthopedics;  Laterality: N/A;  . PORT-A-CATH REMOVAL  04/17/2011   Procedure: REMOVAL PORT-A-CATH;  Surgeon: [name], MD;  Location: [place] [facility];  Service: General;  Laterality: Left;  . PORTACATH PLACEMENT  02/07/2011   Procedure: INSERTION PORT-A-CATH;  Surgeon: [name], MD;  [facility]: [facility];  Service: General;  Laterality: N/A;   Family History  Problem Relation Age of Onset  . Throat cancer Father   .  Alcoholism Father   . Heart attack Father   . Heart disease Mother   . Lymphoma Brother 61  . Cancer Son   . Heart attack Brother   . Diabetes Brother   . Hypertension Brother   . Colon cancer Neg Hx   . Stroke Neg Hx    Social History  Substance Use Topics  . Smoking status: Former Smoker    Packs/day: 2.00    Years: 40.00    Quit date: 12/19/1997  . Smokeless tobacco: Never Used  . Alcohol use 3.6 oz/week    6 Glasses of wine per week     Comment: occasional   Current Outpatient Prescriptions  Medication Sig Dispense Refill  . aspirin 81 MG tablet Take 1 tablet (81 mg total) by mouth daily. 30 tablet   . colesevelam (WELCHOL) 625 MG tablet Take 2,500 mg by mouth daily.    Marland Kitchen glipiZIDE (GLUCOTROL XL) 2.5 MG 24 hr tablet Take 2.5 mg by mouth daily with breakfast.    . ibuprofen (ADVIL,MOTRIN) 200 MG tablet Take 200 mg by mouth every 6 (six) hours as needed for mild pain or moderate pain.    Marland Kitchen levothyroxine (SYNTHROID, LEVOTHROID) 50 MCG tablet Take 50 mcg by mouth daily.    Marland Kitchen lisinopril (PRINIVIL,ZESTRIL) 2.5 MG tablet Take 2.5 mg by mouth daily.    . metFORMIN (GLUCOPHAGE) 500 MG tablet Take 500 mg by mouth 2 (two) times daily with a meal.    . nitroGLYCERIN (NITROSTAT) 0.4 MG SL tablet Place 1 tablet (0.4 mg total) under the tongue every 5 (five) minutes as needed for chest pain. 25 tablet 3  . Omega-3 Fatty Acids (FISH OIL) 1000 MG CAPS Take 1,000 mg by mouth daily.    . pantoprazole (PROTONIX) 40 MG tablet Take 40 mg by mouth daily.    Marland Kitchen PARoxetine (PAXIL) 20 MG tablet Take 20 mg by mouth every morning.    . sitaGLIPtin (JANUVIA) 100 MG tablet Take 100 mg by mouth daily.    . tamoxifen (NOLVADEX) 20 MG tablet Take 1 tablet by mouth  daily 90 tablet 3  . valACYclovir (VALTREX) 1000 MG tablet Take 1,000 mg by mouth 2 (two) times daily as needed (for viral infection).     No current facility-administered medications for this visit.    Allergies  Allergen Reactions  .  Codeine Nausea And Vomiting and Other (See Comments)    Severe stomach cramps  . Antihistamines, Diphenhydramine-Type Other (See Comments)    Causes hyperactivity  . Statins Other (See Comments)    Joint pains  . Erythromycin Hives and Rash    Due to dental work about 50 years ago. Was used in packing and resulted in rash/hives inside and outside of mouth.     Review of Systems: All systems reviewed and negative except  where noted in HPI.    Dg Chest 2 View  Result Date: 09/10/2015 CLINICAL DATA:  Initial encounter. 75 y/o female with c/o sternal CP onset Thursday, sometimes radiates to up and down, and to the LEFT. No other complaints. Hx HTN - on meds, DM, former smoker, heart stent, pna, bronchitis EXAM: CHEST  2 VIEW COMPARISON:  02/07/2011 FINDINGS: Lungs are hyperexpanded.  Lungs are clear. No pleural effusion or pneumothorax. Cardiac silhouette is normal in size. No mediastinal or hilar masses or evidence of adenopathy. Bony thorax is demineralized but intact. IMPRESSION: No acute cardiopulmonary disease. Electronically Signed   By: Lajean Manes M.D.   On: 09/10/2015 11:03   Lab Results  Component Value Date   WBC 6.6 09/10/2015   HGB 12.8 09/10/2015   HCT 37.8 09/10/2015   MCV 89.2 09/10/2015   PLT 212 09/10/2015    Lab Results  Component Value Date   CREATININE 1.47 (H) 09/10/2015   BUN 27 (H) 09/10/2015   NA 137 09/10/2015   K 4.2 09/10/2015   CL 104 09/10/2015   CO2 23 09/10/2015    Lab Results  Component Value Date   ALT 22 09/10/2015   AST 26 09/10/2015   ALKPHOS 60 09/10/2015   BILITOT 0.5 09/10/2015      Physical Exam: BP 134/72   Pulse 74   Ht '5\' 3"'  (1.6 m)   Wt 152 lb 4 oz (69.1 kg)   BMI 26.97 kg/m  Constitutional: Pleasant,well-developed, female in no acute distress. HEENT: Normocephalic and atraumatic. Conjunctivae are normal. No scleral icterus. Neck supple.  Cardiovascular: Normal rate, regular rhythm. 2/6 SEM Pulmonary/chest: Effort  normal and breath sounds normal. No wheezing, rales or rhonchi. Abdominal: Soft, nondistended, nontender. Bowel sounds active throughout. There are no masses palpable. No hepatomegaly. Extremities: no edema Lymphadenopathy: No cervical adenopathy noted. Neurological: Alert and oriented to person place and time. Skin: Skin is warm and dry. No rashes noted. Psychiatric: Normal mood and affect. Behavior is normal.   ASSESSMENT AND PLAN: 75 y/o female with history of CAD with DES in place, on aspirin 42m, who recently presented to the ER with chest pain, had negative cardiac evaluation and symptoms resolved with GI cocktail. Agree that her symptoms were more than likely due to reflux and have not recurred since, but has some new dysphagia which has also been ongoing recently.   Dysphagia - as outlined above. I offered her EGD or barium swallow to initially evaluate. She had an EGD in Jan 2015 which appeared okay but symptoms of dysphagia are quite new. I discussed ddx with her. She wanted to proceed with EGD following discussion of risks / benefits.   GERD / chronic PPI use - she will continue protonix for now at present dose until EGD is done, and can take carafate PRN for any breakthrough or recurrence of prior symptoms. We otherwise discussed chronic PPI use and long term risks. It can increase the risk of chronic kidney disease and she has mild renal insufficiency being evaluated. We also discussed the recent studies which have been linked to a potential increase risk of MI / CVA. While this issue warrants further study and is controversial, long term we want to use the lowest dose of PPI needed to control symptoms, or use H2 blockers if possible. Given her recent episode in the ER however and ongoing dysphagia, will continue present dose until EGD is done. She agreed.   SCarolina Cellar MD [facility] Pager [phone]

## 2015-09-23 NOTE — Patient Instructions (Signed)

## 2015-09-27 ENCOUNTER — Encounter: Payer: Self-pay | Admitting: Gastroenterology

## 2015-09-27 ENCOUNTER — Ambulatory Visit (AMBULATORY_SURGERY_CENTER): Payer: Medicare Other | Admitting: Gastroenterology

## 2015-09-27 VITALS — BP 201/98 | HR 73 | Temp 98.0°F | Resp 13 | Ht 63.0 in | Wt 152.0 lb

## 2015-09-27 DIAGNOSIS — R1310 Dysphagia, unspecified: Secondary | ICD-10-CM | POA: Diagnosis present

## 2015-09-27 LAB — GLUCOSE, CAPILLARY
Glucose-Capillary: 132 mg/dL — ABNORMAL HIGH (ref 65–99)
Glucose-Capillary: 104 mg/dL — ABNORMAL HIGH (ref 65–99)

## 2015-09-27 MED ORDER — SODIUM CHLORIDE 0.9 % IV SOLN: 500.0000 mL | INTRAVENOUS | Status: DC

## 2015-09-27 NOTE — Patient Instructions (Signed)
YOU HAD AN ENDOSCOPIC PROCEDURE TODAY AT [facility]:   Refer to the procedure report that was given to you for any specific questions about what was found during the examination.  If the procedure report does not answer your questions, please call your gastroenterologist to clarify.  If you requested that your care partner not be given the details of your procedure findings, then the procedure report has been included in a sealed envelope for you to review at your convenience later.  YOU SHOULD EXPECT: Some feelings of bloating in the abdomen. Passage of more gas than usual.  Walking can help get rid of the air that was put into your GI tract during the procedure and reduce the bloating.  Please Note:  You might notice some irritation and congestion in your nose or some drainage.  This is from the oxygen used during your procedure.  There is no need for concern and it should clear up in a day or so.  SYMPTOMS TO REPORT IMMEDIATELY:    Following upper endoscopy (EGD)  Vomiting of blood or coffee ground material  New chest pain or pain under the shoulder blades  Painful or persistently difficult swallowing  New shortness of breath  Fever of 100F or higher  Black, tarry-looking stools  For urgent or emergent issues, a gastroenterologist can be reached at any hour by calling [phone].   Diet: Clear liquids until 3pm today, then a soft diet for the rest of the day today.  Regular diet tomorrow..  Drink plenty of fluids but you should avoid alcoholic beverages for 24 hours.  ACTIVITY:  You should plan to take it easy for the rest of today and you should NOT DRIVE or use heavy machinery until tomorrow (because of the sedation medicines used during the test).    FOLLOW UP: Our staff will call the number listed on your records the next business day following your procedure to check on you and address any questions or concerns that you may have regarding the information given  to you following your procedure. If we do not reach you, we will leave a message.  However, if you are feeling well and you are not experiencing any problems, there is no need to return our call.  We will assume that you have returned to your regular daily activities without incident.  If any biopsies were taken you will be contacted by phone or by letter within the next 1-3 weeks.  Please call us at [phone] if you have not heard about the biopsies in 3 weeks.    SIGNATURES/CONFIDENTIALITY: You and/or your care partner have signed paperwork which will be entered into your electronic medical record.  These signatures attest to the fact that that the information above on your After Visit Summary has been reviewed and is understood.  Full responsibility of the confidentiality of this discharge information lies with you and/or your care-partner.  Read all of the handouts given to you by your recovery room nurse.   Thank-you for choosing [place] for your healthcare needs today.

## 2015-09-27 NOTE — Op Note (Signed)
[facility] Patient Name: Javaeh Thompsen Procedure Date: 09/27/2015 1:22 PM MRN: [id number] Endoscopist: Remo Lipps P. Havery Moros , MD Age: 75 Referring MD:  Date of Birth: Feb 16, 1940 Gender: Female Account #: [id number] Procedure:                Upper GI endoscopy Indications:              Dysphagia, chest pain Medicines:                Monitored Anesthesia Care Procedure:                Pre-Anesthesia Assessment:                           - Prior to the procedure, a History and Physical                            was performed, and patient medications and                            allergies were reviewed. The patient's tolerance of                            previous anesthesia was also reviewed. The risks                            and benefits of the procedure and the sedation                            options and risks were discussed with the patient.                            All questions were answered, and informed consent                            was obtained. Prior Anticoagulants: The patient has                            taken aspirin, last dose was 1 day prior to                            procedure. ASA Grade Assessment: II - A patient                            with mild systemic disease. After reviewing the                            risks and benefits, the patient was deemed in                            satisfactory condition to undergo the procedure.                           After obtaining informed consent, the endoscope was  passed under direct vision. Throughout the                            procedure, the patient's blood pressure, pulse, and                            oxygen saturations were monitored continuously. The                            Model GIF-HQ190 [id number]) scope was introduced                            through the mouth, and advanced to the second part                            of duodenum. The upper GI  endoscopy was                            accomplished without difficulty. The patient                            tolerated the procedure well. Scope In: Scope Out: Findings:                 Esophagogastric landmarks were identified: the                            Z-line was found at 40 cm, the gastroesophageal                            junction was found at 40 cm and the upper extent of                            the gastric folds was found at 40 cm from the                            incisors.                           The exam of the esophagus was otherwise normal. No                            stenosis or inflammatory changes were noted.                           Biopsies were taken with a cold forceps in the                            upper third of the esophagus, in the middle third                            of the esophagus and in the lower third of the  esophagus for histology to rule out eosinphilic                            esophagitis.                           A guidewire was placed and the scope was withdrawn.                            Dilation was performed in the entire esophagus with                            a Savary dilator with no resistance at 17 mm and 18                            mm with no mucosal wrent noted.                           The entire examined stomach was normal.                           The duodenal bulb and second portion of the                            duodenum were normal. Complications:            No immediate complications. Estimated blood loss:                            Minimal. Estimated Blood Loss:     Estimated blood loss was minimal. Impression:               - Esophagogastric landmarks identified.                           - Normal esophagus without stenosis or inflammatory                            changes - biopsies taken to rule out eosinophilic                            esophagitis, empiric dilation  performed with 17 and                            76mm Savory dilatory.                           - Normal stomach.                           - Normal duodenal bulb and second portion of the                            duodenum. Recommendation:           - Patient has a contact number available for  emergencies. The signs and symptoms of potential                            delayed complications were discussed with the                            patient. Return to normal activities tomorrow.                            Written discharge instructions were provided to the                            patient.                           - Resume previous diet.                           - Continue present medications.                           - Await pathology results.                           - Repeat upper endoscopy PRN for retreatment if                            dilation has provided benefit Yariah Selvey P. Karry Barrilleaux, MD 09/27/2015 1:43:51 PM This report has been signed electronically.

## 2015-09-27 NOTE — Progress Notes (Signed)
A/ox3 pleased with MAC, report to Suzanne RN 

## 2015-09-28 ENCOUNTER — Telehealth: Payer: Self-pay | Admitting: *Deleted

## 2015-09-28 NOTE — Telephone Encounter (Signed)
  Follow up Call-  Call back number 09/27/2015  Post procedure Call Back phone  # [phone]  Permission to leave phone message Yes  Some recent data might be hidden     No answer, left message.

## 2015-09-30 ENCOUNTER — Ambulatory Visit: Payer: Self-pay | Admitting: Orthopedic Surgery

## 2015-09-30 NOTE — H&P (Signed)
[name] is an 75 y.o. female.   Chief Complaint: back and bilateral leg pain HPI: The patient is a 75 year old female who presents today for follow up of their back. The patient is being followed for their low back symptoms. They are now 7 1/2 months out from a flare up. Symptoms reported today include: pain. Current treatment includes: relative rest and activity modification. The following medication has been used for pain control: none. The patient presents today following right L4-5 facet injection x 2 weeks (only helped for a day or two).  [name] follows up today for her back. She is now two weeks out from a facet injection at L4-5 on the right. Dr. Nelva Bush was not able to aspirate that cyst, but he did inject. She reports that she had relief of essentially all of her leg pain for one or two days following the injection of her bilateral leg pain. After that the pain did return. She reports the pain bilateral legs. Both sides are equally severe. She denies any numbness or tingling. She is the worse when she is upright and walking. She is generally more comfortable at night and sometimes get some cramping in her calves. Sometimes she has to continue to move without at night or lying on her couch instead for more comfort. She is not currently taking anything for pain for her back. Reports she is seeing [name] tomorrow to discuss her diabetic medications. Her last A1c was 7.3. He has recently stopped her Invokana and decreased her metformin due to an increase in her creatinine and he has been talking to her, but maybe about starting her on insulin. She has an upcoming wedding for her granddaughter on September 16th. She also reports a history of a prior ganglion cyst in the wrist.   Past Medical History:  Diagnosis Date  . Anemia    childhood  . Anxiety   . Arterial stenosis (HCC)    mesenteric  . Arthritis   . Breast cancer ([facility]) 12/05/10   R breast, inv mammary, in situ,ER/PR  +,HER2 -  . Cancer ([facility])   . Carcinoma of breast treated with adjuvant chemotherapy ([facility])   . Coronary artery disease    stent 2007  . Depression   . Diabetes mellitus   . Difficulty sleeping   . Diverticulosis 12/10/03  . Elevated cholesterol   . GERD (gastroesophageal reflux disease)   . Heart murmur    secondary to chem. 2013  . HSV-1 (herpes simplex virus 1) infection   . Hyperlipidemia   . Hypertension   . Hypothyroidism   . Osteoarthritis   . Pneumonia    2014 september  . 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 Spinal stenosis   . Thyroid disease     Past Surgical History:  Procedure Laterality Date  . BREAST LUMPECTOMY  1983   benign biopsy  . BREAST LUMPECTOMY Right 12/29/2010   snbx, ER/PR +, Her2 -, 0/1 node pos.  . CAROTID STENT  2007  . CARPAL TUNNEL RELEASE Bilateral 9/12,8,12  . CHOLECYSTECTOMY    . COLONOSCOPY N/A 02/09/2013   Procedure: COLONOSCOPY;  Surgeon: [name], MD;  Location: [facility];  Service: Endoscopy;  Laterality: N/A;  . CORONARY ANGIOPLASTY    . DILATION AND CURETTAGE OF UTERUS    . ESOPHAGOGASTRODUODENOSCOPY N/A 02/09/2013   Procedure: ESOPHAGOGASTRODUODENOSCOPY (EGD);  Surgeon: [name], MD;  Location: [name];  Service: Endoscopy;  Laterality: N/A;  . FOOT SPURS    . HYSTEROSCOPY W/D&C N/A 09/11/2012   Procedure: DILATATION AND CURETTAGE ;  Surgeon: [name], MD;  Location: [facility];  Service: Gynecology;  Laterality: N/A;  . KNEE SURGERY  2007  . LUMBAR LAMINECTOMY/DECOMPRESSION MICRODISCECTOMY N/A 10/20/2014   Procedure: MICRO LUMBER DECOMPRESSION L3-4 L4-5;  Surgeon: [name], MD;  Location: [facility];  Service: Orthopedics;  Laterality: N/A;  . PORT-A-CATH REMOVAL  04/17/2011   Procedure: REMOVAL PORT-A-CATH;  Surgeon: [name], MD;  Location: [facility];  Service: General;  Laterality: Left;  . PORTACATH PLACEMENT  02/07/2011   Procedure: INSERTION PORT-A-CATH;  Surgeon:  [name], MD;  Location: [facility];  Service: General;  Laterality: N/A;    Family History  Problem Relation Age of Onset  . Throat cancer Father   . Alcoholism Father   . Heart attack Father   . Heart disease Mother   . Lymphoma Brother 60  . Cancer Son   . Heart attack Brother   . Diabetes Brother   . Hypertension Brother   . Colon cancer Neg Hx   . Stroke Neg Hx    Social History:  reports that she quit smoking about 17 years ago. She has a 80.00 pack-year smoking history. She has never used smokeless tobacco. She reports that she drinks about 3.6 oz of alcohol per week . She reports that she does not use drugs.  Allergies:  Allergies  Allergen Reactions  . Codeine Nausea And Vomiting and Other (See Comments)    Severe stomach cramps  . Antihistamines, Diphenhydramine-Type Other (See Comments)    Causes hyperactivity  . Statins Other (See Comments)    Joint pains  . Erythromycin Hives and Rash    Due to dental work about 50 years ago. Was used in packing and resulted in rash/hives inside and outside of mouth.     (Not in a hospital admission)  No results found for this or any previous visit (from the past 48 hour(s)). No results found.  Review of Systems  Constitutional: Negative.   HENT: Negative.   Eyes: Negative.   Respiratory: Negative.   Cardiovascular: Negative.   Gastrointestinal: Negative.   Genitourinary: Negative.   Musculoskeletal: Positive for back pain.  Skin: Negative.   Neurological: Positive for sensory change and focal weakness.  Psychiatric/Behavioral: Negative.     There were no vitals taken for this visit. Physical Exam  Constitutional: She appears well-developed.  HENT:  Head: Normocephalic.  Eyes: Pupils are equal, round, and reactive to light.  Neck: Normal range of motion.  Cardiovascular: Normal rate.   Respiratory: Effort normal.  GI: Soft.  Musculoskeletal:  On exam, she is well nourished, well developed, awake, alert  and oriented x3, in no acute distress. She is comfortably seated. Well-healed midline incision lower lumbar spine, nontender through the lumbar spinous processes, paraspinous musculature and lateral hips. She is mildly tender in the buttocks bilaterally. Positive straight leg raise bilaterally reproducing buttock and leg pain. EHL weakness bilaterally, 4+/5, otherwise strength is 5/5 through the lower extremities, hip flexors, quads, hamstrings, plantar flexion, dorsiflexion. No calf pain or sign of DVT.   Neurological: She is alert.    Prior x-rays and MRI reviewed with recurrence stenosis at L4-5 as well as a small synovial cyst at the right facet joint at L4-5. Both of those causing moderate central stenosis as well as encouragement on the right L5 nerve root. This is likely underestimated due to her  position for the MRI. There is also at L5-S1 multifactorial central spinal stenosis, lateral recess stenosis and foraminal stenosis with mass effect on the exiting L5 roots. That is all due to a disc bulge osteophyte complex and facet arthrosis.  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 Bilateral lower extremity radicular pain, temporary relief following a facet injection L4-5 on the right two weeks ago.  We discussed relevant anatomy and etiology of her pain in detail. She does seem to have multiple factors contributing to her pain. She responded well to the facet injection two weeks ago at L4-5 on the right confirming that as a pain generator. She also has significant stenosis at L5-S1, has been refractory to an ESI at L5-S1. We discussed options and after discussion mutually have agreed to proceed with surgical decompression due to ongoing neural tension signs and myotomal weakness, ongoing symptoms refractory to injections and medication. We would recommend  decompression at L5-S1, redo decompression at L4-5 with removal of synovial cyst at L4-5. Discussed the procedure itself as well as risks, complications and alternatives. Discussed postop protocols. She could not tolerate Norco before. We will use Percocet for pain postoperatively for her. Given her a clearance letter to take to her appointment tomorrow with [name]. He is considering putting her on insulin. We discussed the importance of strict glycemic control around the time of surgery to help with healing and to delay chances of infection. We will await his clearance. If she needs clearance from her cardiologist, we will await for his guidance from that as well. We did obtain clearance from her cardiologist last time about a year ago. She has been stable with her cardiac status since then. She previously tested positive for staph, but negative for MRSA. Therefore, I have given her prescriptions for decolonization for her last surgery. We utilized for preoperative antibiotic prophylaxis. She will follow up postoperatively. She would like to wait till later in September for this to be scheduled due to her granddaughter's wedding, which is reasonable. She will follow up 10 to 14 days postop for staple removal. Call if any questions or concerns in the interim. I also discussed in the interim activity modifications and disc pressure management. The patient was seen in conjunction with Dr. Tonita Cong today.  Plan microlumbar decompression L5-S1, redo decompression L4-5, removal of facet cyst L4-5  Jerel Sardina, Conley Rolls., PA-C for Dr. Tonita Cong 09/30/2015, 1:12 PM

## 2015-10-05 ENCOUNTER — Encounter: Payer: Self-pay | Admitting: Gastroenterology

## 2015-10-05 ENCOUNTER — Ambulatory Visit (HOSPITAL_COMMUNITY)
Admission: RE | Admit: 2015-10-05 | Discharge: 2015-10-05 | Disposition: A | Discharge status: Home / Self Care | Payer: Medicare Other | Source: Ambulatory Visit | Attending: Orthopedic Surgery | Admitting: Orthopedic Surgery

## 2015-10-05 ENCOUNTER — Encounter (HOSPITAL_COMMUNITY)
Admission: RE | Admit: 2015-10-05 | Discharge: 2015-10-05 | Disposition: A | Discharge status: Home / Self Care | Payer: Medicare Other | Source: Ambulatory Visit | Attending: Specialist | Admitting: Specialist

## 2015-10-05 ENCOUNTER — Encounter (HOSPITAL_COMMUNITY): Payer: Self-pay

## 2015-10-05 DIAGNOSIS — Z01812 Encounter for preprocedural laboratory examination: Secondary | ICD-10-CM | POA: Insufficient documentation

## 2015-10-05 DIAGNOSIS — M48061 Spinal stenosis, lumbar region without neurogenic claudication: Secondary | ICD-10-CM

## 2015-10-05 DIAGNOSIS — M4806 Spinal stenosis, lumbar region: Secondary | ICD-10-CM | POA: Diagnosis not present

## 2015-10-05 DIAGNOSIS — Z01818 Encounter for other preprocedural examination: Secondary | ICD-10-CM | POA: Diagnosis not present

## 2015-10-05 HISTORY — DX: Inflammatory liver disease, unspecified: K75.9

## 2015-10-05 LAB — BASIC METABOLIC PANEL
Anion gap: 9 (ref 5–15)
BUN: 22 mg/dL — AB (ref 6–20)
CALCIUM: 9.3 mg/dL (ref 8.9–10.3)
CHLORIDE: 107 mmol/L (ref 101–111)
CO2: 24 mmol/L (ref 22–32)
CREATININE: 1.32 mg/dL — AB (ref 0.44–1.00)
GFR calc non Af Amer: 39 mL/min — ABNORMAL LOW (ref >60)
GFR, EST AFRICAN AMERICAN: 45 mL/min — AB (ref >60)
Glucose, Bld: 166 mg/dL — ABNORMAL HIGH (ref 65–99)
Potassium: 5.2 mmol/L — ABNORMAL HIGH (ref 3.5–5.1)
SODIUM: 140 mmol/L (ref 135–145)

## 2015-10-05 LAB — CBC
HCT: 38 % (ref 36.0–46.0)
Hemoglobin: 12.3 g/dL (ref 12.0–15.0)
MCH: 29.1 pg (ref 26.0–34.0)
MCHC: 32.4 g/dL (ref 30.0–36.0)
MCV: 90 fL (ref 78.0–100.0)
PLATELETS: 248 10*3/uL (ref 150–400)
RBC: 4.22 MIL/uL (ref 3.87–5.11)
RDW: 13.8 % (ref 11.5–15.5)
WBC: 6.6 10*3/uL (ref 4.0–10.5)

## 2015-10-05 NOTE — Patient Instructions (Signed)
[name]  10/05/2015   Your procedure is scheduled on: 10/13/15  Report to [facility] Main  Entrance take [facility]  elevators to 3rd floor to  [place] at 5:15 AM.  Call this number if you have problems the morning of surgery [phone]   Remember: ONLY 1 PERSON MAY GO WITH YOU TO SHORT STAY TO GET  READY MORNING OF [place].  Do not eat food or drink liquids :After Midnight.     Take these medicines the morning of surgery with A SIP OF WATER:  Tamoxifen, Paxil, Protonix, Welchol, Synthroid DO NOT TAKE ANY DIABETIC MEDICATIONS DAY OF YOUR SURGERY or do not take Losartan on the morning of surgery.                               You may not have any metal on your body including hair pins and              piercings  Do not wear jewelry, make-up, lotions, powders or perfumes, deodorant             Do not wear nail polish.  Do not shave  48 hours prior to surgery.                Do not bring valuables to the hospital. [place].  Contacts, dentures or bridgework may not be worn into surgery.  Leave suitcase in the car. After surgery it may be brought to your room.     Patients discharged the day of surgery will not be allowed to drive home.  Name and phone number of your driver:  Special Instructions: N/A              Please read over the following fact sheets you were given: _____________________________________________________________________             [facility] - Preparing for Surgery Before surgery, you can play an important role.  Because skin is not sterile, your skin needs to be as free of germs as possible.  You can reduce the number of germs on your skin by washing with CHG (chlorahexidine gluconate) soap before surgery.  CHG is an antiseptic cleaner which kills germs and bonds with the skin to continue killing germs even after washing. Please DO NOT use if you have an allergy to  CHG or antibacterial soaps.  If your skin becomes reddened/irritated stop using the CHG and inform your nurse when you arrive at Short Stay. Do not shave (including legs and underarms) for at least 48 hours prior to the first CHG shower.  You may shave your face/neck. Please follow these instructions carefully:  1.  Shower with CHG Soap the night before surgery and the  morning of Surgery.  2.  If you choose to wash your hair, wash your hair first as usual with your  normal  shampoo.  3.  After you shampoo, rinse your hair and body thoroughly to remove the  shampoo.                           4.  Use CHG as you would any other liquid soap.  You can apply chg  directly  to the skin and wash                       Gently with a scrungie or clean washcloth.  5.  Apply the CHG Soap to your body ONLY FROM THE NECK DOWN.   Do not use on face/ open                           Wound or open sores. Avoid contact with eyes, ears mouth and genitals (private parts).                       Wash face,  Genitals (private parts) with your normal soap.             6.  Wash thoroughly, paying special attention to the area where your surgery  will be performed.  7.  Thoroughly rinse your body with warm water from the neck down.  8.  DO NOT shower/wash with your normal soap after using and rinsing off  the CHG Soap.                9.  Pat yourself dry with a clean towel.            10.  Wear clean pajamas.            11.  Place clean sheets on your bed the night of your first shower and do not  sleep with pets. Day of Surgery : Do not apply any lotions/deodorants the morning of surgery.  Please wear clean clothes to the hospital/surgery center.  FAILURE TO FOLLOW THESE INSTRUCTIONS MAY RESULT IN THE CANCELLATION OF YOUR SURGERY PATIENT SIGNATURE_________________________________  NURSE SIGNATURE__________________________________  ________________________________________________________________________

## 2015-10-06 LAB — HEMOGLOBIN A1C
Hgb A1c MFr Bld: 7.4 % — ABNORMAL HIGH (ref 4.8–5.6)
Mean Plasma Glucose: 166 mg/dL

## 2015-10-10 ENCOUNTER — Ambulatory Visit: Payer: Self-pay | Admitting: Orthopedic Surgery

## 2015-10-10 NOTE — H&P (Signed)
[name] is an 74 y.o. female.   Chief Complaint: back and bilateral leg pain HPI: The patient is a 74 year old female who presents today for follow up of their back. The patient is being followed for their low back symptoms. They are now 7 1/2 months out from a flare up. Symptoms reported today include: pain. Current treatment includes: relative rest and activity modification. The following medication has been used for pain control: none. The patient presents today following right L4-5 facet injection x 2 weeks (only helped for a day or two).  [name] follows up today for her back. She is now two weeks out from a facet injection at L4-5 on the right. Dr. Ramos was not able to aspirate that cyst, but he did inject. She reports that she had relief of essentially all of her leg pain for one or two days following the injection of her bilateral leg pain. After that the pain did return. She reports the pain bilateral legs. Both sides are equally severe. She denies any numbness or tingling. She is the worse when she is upright and walking. She is generally more comfortable at night and sometimes get some cramping in her calves. Sometimes she has to continue to move without at night or lying on her couch instead for more comfort. She is not currently taking anything for pain for her back. Reports she is seeing Dr. Bouska tomorrow to discuss her diabetic medications. Her last A1c was 7.3. He has recently stopped her Invokana and decreased her metformin due to an increase in her creatinine and he has been talking to her, but maybe about starting her on insulin. She has an upcoming wedding for her granddaughter on September 16th. She also reports a history of a prior ganglion cyst in the wrist.  Past Medical History:  Diagnosis Date  . Anemia    childhood  . Anxiety   . Arterial stenosis (HCC)    mesenteric  . Arthritis   . Breast cancer (HCC) 12/05/10   R breast, inv mammary, in situ,ER/PR +,HER2  -  . Cancer (HCC)   . Carcinoma of breast treated with adjuvant chemotherapy (HCC)   . Coronary artery disease    stent 2007  . Depression   . Diabetes mellitus   . Difficulty sleeping   . Diverticulosis 12/10/03  . Elevated cholesterol   . GERD (gastroesophageal reflux disease)   . Heart murmur    secondary to chem. 2013  . Hepatitis    as an infant  . HSV-1 (herpes simplex virus 1) infection   . Hyperlipidemia   . Hypertension   . Hypothyroidism   . Osteoarthritis   . Pneumonia    2014 september  . 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 Spinal stenosis   . Thyroid disease     Past Surgical History:  Procedure Laterality Date  . BREAST LUMPECTOMY  1983   benign biopsy  . BREAST LUMPECTOMY Right 12/29/2010   snbx, ER/PR +, Her2 -, 0/1 node pos.  . CAROTID STENT  2007  . CARPAL TUNNEL RELEASE Bilateral 9/12,8,12  . CHOLECYSTECTOMY    . COLONOSCOPY N/A 02/09/2013   Procedure: COLONOSCOPY;  Surgeon: Dora M Brodie, MD;  Location: [facility];  Service: Endoscopy;  Laterality: N/A;  . CORONARY ANGIOPLASTY    . DILATION AND CURETTAGE OF UTERUS    . ESOPHAGOGASTRODUODENOSCOPY N/A 02/09/2013   Procedure: ESOPHAGOGASTRODUODENOSCOPY (EGD);  Surgeon: Dora M Brodie, MD;    Location: [facility];  Service: Endoscopy;  Laterality: N/A;  . FOOT SPURS    . HYSTEROSCOPY W/D&C N/A 09/11/2012   Procedure: DILATATION AND CURETTAGE ;  Surgeon: Gretchen Adkins, MD;  Location: [facility];  Service: Gynecology;  Laterality: N/A;  . KNEE SURGERY  2007  . LUMBAR LAMINECTOMY/DECOMPRESSION MICRODISCECTOMY N/A 10/20/2014   Procedure: MICRO LUMBER DECOMPRESSION L3-4 L4-5;  Surgeon: Jeffrey Beane, MD;  Location: [facility];  Service: Orthopedics;  Laterality: N/A;  . PORT-A-CATH REMOVAL  04/17/2011   Procedure: REMOVAL PORT-A-CATH;  Surgeon: Matthew Wakefield, MD;  Location: [facility];  Service: General;  Laterality: Left;  . PORTACATH PLACEMENT  02/07/2011   Procedure:  INSERTION PORT-A-CATH;  Surgeon: Matthew Wakefield, MD;  Location: [facility];  Service: General;  Laterality: N/A;    Family History  Problem Relation Age of Onset  . Throat cancer Father   . Alcoholism Father   . Heart attack Father   . Heart disease Mother   . Lymphoma Brother 60  . Cancer Son   . Heart attack Brother   . Diabetes Brother   . Hypertension Brother   . Colon cancer Neg Hx   . Stroke Neg Hx    Social History:  reports that she quit smoking about 17 years ago. She has a 80.00 pack-year smoking history. She has never used smokeless tobacco. She reports that she drinks about 3.6 oz of alcohol per week . She reports that she does not use drugs.  Allergies:  Allergies  Allergen Reactions  . Codeine Nausea And Vomiting and Other (See Comments)    Severe stomach cramps  . Antihistamines, Diphenhydramine-Type Other (See Comments)    Causes hyperactivity  . Statins Other (See Comments)    Joint pains  . Erythromycin Hives and Rash    Due to dental work about 50 years ago. Was used in packing and resulted in rash/hives inside and outside of mouth.     (Not in a hospital admission)  No results found for this or any previous visit (from the past 48 hour(s)). No results found.  Review of Systems  Constitutional: Negative.   HENT: Negative.   Eyes: Negative.   Respiratory: Negative.   Cardiovascular: Negative.   Gastrointestinal: Negative.   Genitourinary: Negative.   Musculoskeletal: Positive for back pain.  Skin: Negative.   Neurological: Positive for sensory change and focal weakness.  Psychiatric/Behavioral: Negative.     There were no vitals taken for this visit. Physical Exam  Constitutional: She is oriented to person, place, and time. She appears well-developed.  HENT:  Head: Normocephalic.  Eyes: Pupils are equal, round, and reactive to light.  Neck: Normal range of motion.  Cardiovascular: Normal rate.   Respiratory: Effort normal.  GI: Soft.   Musculoskeletal:  On exam, she is well nourished, well developed, awake, alert and oriented x3, in no acute distress. She is comfortably seated. Well-healed midline incision lower lumbar spine, nontender through the lumbar spinous processes, paraspinous musculature and lateral hips. She is mildly tender in the buttocks bilaterally. Positive straight leg raise bilaterally reproducing buttock and leg pain. EHL weakness bilaterally, 4+/5, otherwise strength is 5/5 through the lower extremities, hip flexors, quads, hamstrings, plantar flexion, dorsiflexion. No calf pain or sign of DVT.  Neurological: She is alert and oriented to person, place, and time.    Prior x-rays and MRI reviewed with recurrence stenosis at L4-5 as well as a small synovial cyst at the right facet joint at L4-5. Both of those   causing moderate central stenosis as well as encouragement on the right L5 nerve root. This is likely underestimated due to her position for the MRI. There is also at L5-S1 multifactorial central spinal stenosis, lateral recess stenosis and foraminal stenosis with mass effect on the exiting L5 roots. That is all due to a disc bulge osteophyte complex and facet arthrosis.  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 Bilateral lower extremity radicular pain, temporary relief following a facet injection L4-5 on the right two weeks ago.  We discussed relevant anatomy and etiology of her pain in detail. She does seem to have multiple factors contributing to her pain. She responded well to the facet injection two weeks ago at L4-5 on the right confirming that as a pain generator. She also has significant stenosis at L5-S1, has been refractory to an ESI at L5-S1. We discussed options and after discussion mutually have agreed to proceed with surgical decompression due to ongoing  neural tension signs and myotomal weakness, ongoing symptoms refractory to injections and medication. We would recommend decompression at L5-S1, redo decompression at L4-5 with removal of synovial cyst at L4-5. Discussed the procedure itself as well as risks, complications and alternatives. Discussed postop protocols. She could not tolerate Norco before. We will use Percocet for pain postoperatively for her. Given her a clearance letter to take to her appointment tomorrow with Dr. Bouska. He is considering putting her on insulin. We discussed the importance of strict glycemic control around the time of surgery to help with healing and to delay chances of infection. We will await his clearance. If she needs clearance from her cardiologist, we will await for his guidance from that as well. We did obtain clearance from her cardiologist last time about a year ago. She has been stable with her cardiac status since then. She previously tested positive for staph, but negative for MRSA. Therefore, I have given her prescriptions for decolonization for her last surgery. We utilized for preoperative antibiotic prophylaxis. She will follow up postoperatively. She would like to wait till later in September for this to be scheduled due to her granddaughter's wedding, which is reasonable. She will follow up 10 to 14 days postop for staple removal. Call if any questions or concerns in the interim. I also discussed in the interim activity modifications and disc pressure management. The patient was seen in conjunction with Dr. Beane today.  Plan microlumbar decompression L5-S1, redo decompression L4-5, removal of facet cyst L4-5  BISSELL, JACLYN M., PA-C for Dr. Beane 10/10/2015, 11:11 AM   

## 2015-10-13 ENCOUNTER — Ambulatory Visit (HOSPITAL_COMMUNITY): Payer: Medicare Other | Admitting: Registered Nurse

## 2015-10-13 ENCOUNTER — Encounter (HOSPITAL_COMMUNITY)
Admission: RE | Disposition: A | Discharge status: Home / Self Care | Payer: Self-pay | Source: Ambulatory Visit | Attending: Specialist

## 2015-10-13 ENCOUNTER — Ambulatory Visit (HOSPITAL_COMMUNITY): Payer: Medicare Other

## 2015-10-13 ENCOUNTER — Ambulatory Visit (HOSPITAL_COMMUNITY)
Admission: RE | Admit: 2015-10-13 | Discharge: 2015-10-14 | Disposition: A | Discharge status: Home / Self Care | Payer: Medicare Other | Source: Ambulatory Visit | Attending: Specialist | Admitting: Specialist

## 2015-10-13 ENCOUNTER — Encounter (HOSPITAL_COMMUNITY): Payer: Self-pay | Admitting: *Deleted

## 2015-10-13 DIAGNOSIS — F419 Anxiety disorder, unspecified: Secondary | ICD-10-CM | POA: Insufficient documentation

## 2015-10-13 DIAGNOSIS — M4806 Spinal stenosis, lumbar region: Secondary | ICD-10-CM | POA: Insufficient documentation

## 2015-10-13 DIAGNOSIS — Z419 Encounter for procedure for purposes other than remedying health state, unspecified: Secondary | ICD-10-CM

## 2015-10-13 DIAGNOSIS — M7138 Other bursal cyst, other site: Secondary | ICD-10-CM | POA: Insufficient documentation

## 2015-10-13 DIAGNOSIS — E119 Type 2 diabetes mellitus without complications: Secondary | ICD-10-CM | POA: Insufficient documentation

## 2015-10-13 DIAGNOSIS — E039 Hypothyroidism, unspecified: Secondary | ICD-10-CM | POA: Insufficient documentation

## 2015-10-13 DIAGNOSIS — E7800 Pure hypercholesterolemia, unspecified: Secondary | ICD-10-CM | POA: Insufficient documentation

## 2015-10-13 DIAGNOSIS — Z7984 Long term (current) use of oral hypoglycemic drugs: Secondary | ICD-10-CM | POA: Insufficient documentation

## 2015-10-13 DIAGNOSIS — Z853 Personal history of malignant neoplasm of breast: Secondary | ICD-10-CM | POA: Insufficient documentation

## 2015-10-13 DIAGNOSIS — I2510 Atherosclerotic heart disease of native coronary artery without angina pectoris: Secondary | ICD-10-CM | POA: Insufficient documentation

## 2015-10-13 DIAGNOSIS — Z7982 Long term (current) use of aspirin: Secondary | ICD-10-CM | POA: Insufficient documentation

## 2015-10-13 DIAGNOSIS — Z955 Presence of coronary angioplasty implant and graft: Secondary | ICD-10-CM | POA: Insufficient documentation

## 2015-10-13 DIAGNOSIS — Z79899 Other long term (current) drug therapy: Secondary | ICD-10-CM | POA: Insufficient documentation

## 2015-10-13 DIAGNOSIS — M48061 Spinal stenosis, lumbar region without neurogenic claudication: Secondary | ICD-10-CM | POA: Diagnosis present

## 2015-10-13 DIAGNOSIS — F329 Major depressive disorder, single episode, unspecified: Secondary | ICD-10-CM | POA: Insufficient documentation

## 2015-10-13 DIAGNOSIS — Z87891 Personal history of nicotine dependence: Secondary | ICD-10-CM | POA: Insufficient documentation

## 2015-10-13 DIAGNOSIS — I10 Essential (primary) hypertension: Secondary | ICD-10-CM | POA: Insufficient documentation

## 2015-10-13 DIAGNOSIS — Z79810 Long term (current) use of selective estrogen receptor modulators (SERMs): Secondary | ICD-10-CM | POA: Insufficient documentation

## 2015-10-13 DIAGNOSIS — K219 Gastro-esophageal reflux disease without esophagitis: Secondary | ICD-10-CM | POA: Insufficient documentation

## 2015-10-13 HISTORY — PX: LUMBAR LAMINECTOMY/DECOMPRESSION MICRODISCECTOMY: SHX5026

## 2015-10-13 LAB — TYPE AND SCREEN
ABO/RH(D): O NEG
Antibody Screen: NEGATIVE

## 2015-10-13 LAB — GLUCOSE, CAPILLARY
GLUCOSE-CAPILLARY: 153 mg/dL — AB (ref 65–99)
GLUCOSE-CAPILLARY: 285 mg/dL — AB (ref 65–99)
Glucose-Capillary: 314 mg/dL — ABNORMAL HIGH (ref 65–99)

## 2015-10-13 SURGERY — LUMBAR LAMINECTOMY/DECOMPRESSION MICRODISCECTOMY 2 LEVELS
Anesthesia: General | Site: Back | Laterality: Bilateral

## 2015-10-13 MED ORDER — PAROXETINE HCL 20 MG PO TABS
20.0000 mg | ORAL_TABLET | Freq: Every day | ORAL | Status: DC
  Administered 2015-10-14: 20 mg via ORAL
  Filled 2015-10-13: qty 1

## 2015-10-13 MED ORDER — PROPOFOL 10 MG/ML IV BOLUS
INTRAVENOUS | Status: DC | PRN
  Administered 2015-10-13: 140 mg via INTRAVENOUS

## 2015-10-13 MED ORDER — MIDAZOLAM HCL 5 MG/5ML IJ SOLN
INTRAMUSCULAR | Status: DC | PRN
  Administered 2015-10-13: 2 mg via INTRAVENOUS

## 2015-10-13 MED ORDER — MENTHOL 3 MG MT LOZG: 1.0000 | LOZENGE | OROMUCOSAL | Status: DC | PRN

## 2015-10-13 MED ORDER — SUGAMMADEX SODIUM 200 MG/2ML IV SOLN
INTRAVENOUS | Status: DC | PRN
  Administered 2015-10-13: 140 mg via INTRAVENOUS

## 2015-10-13 MED ORDER — LIDOCAINE 2% (20 MG/ML) 5 ML SYRINGE
INTRAMUSCULAR | Status: AC
  Filled 2015-10-13: qty 5

## 2015-10-13 MED ORDER — BISACODYL 5 MG PO TBEC: 5.0000 mg | DELAYED_RELEASE_TABLET | Freq: Every day | ORAL | Status: DC | PRN

## 2015-10-13 MED ORDER — PHENOL 1.4 % MT LIQD: 1.0000 | OROMUCOSAL | Status: DC | PRN

## 2015-10-13 MED ORDER — CEFAZOLIN SODIUM-DEXTROSE 2-4 GM/100ML-% IV SOLN
2.0000 g | INTRAVENOUS | Status: AC
  Administered 2015-10-13: 2 g via INTRAVENOUS

## 2015-10-13 MED ORDER — BUPIVACAINE-EPINEPHRINE (PF) 0.5% -1:200000 IJ SOLN
INTRAMUSCULAR | Status: DC | PRN
  Administered 2015-10-13: 14 mL

## 2015-10-13 MED ORDER — LACTATED RINGERS IV SOLN
INTRAVENOUS | Status: DC
  Administered 2015-10-13: 11:00:00 via INTRAVENOUS

## 2015-10-13 MED ORDER — METHOCARBAMOL 500 MG PO TABS: 500.0000 mg | ORAL_TABLET | Freq: Four times a day (QID) | ORAL | Status: DC | PRN

## 2015-10-13 MED ORDER — PROPOFOL 10 MG/ML IV BOLUS
INTRAVENOUS | Status: AC
  Filled 2015-10-13: qty 20

## 2015-10-13 MED ORDER — CHLORHEXIDINE GLUCONATE 4 % EX LIQD: 60.0000 mL | Freq: Once | CUTANEOUS | Status: DC

## 2015-10-13 MED ORDER — FENTANYL CITRATE (PF) 100 MCG/2ML IJ SOLN
INTRAMUSCULAR | Status: AC
  Filled 2015-10-13: qty 2

## 2015-10-13 MED ORDER — BUPIVACAINE-EPINEPHRINE (PF) 0.5% -1:200000 IJ SOLN
INTRAMUSCULAR | Status: AC
  Filled 2015-10-13: qty 30

## 2015-10-13 MED ORDER — OXYCODONE-ACETAMINOPHEN 5-325 MG PO TABS: 1.0000 | ORAL_TABLET | ORAL | 0 refills | Status: DC | PRN

## 2015-10-13 MED ORDER — ALUM & MAG HYDROXIDE-SIMETH 200-200-20 MG/5ML PO SUSP: 30.0000 mL | Freq: Four times a day (QID) | ORAL | Status: DC | PRN

## 2015-10-13 MED ORDER — SUGAMMADEX SODIUM 200 MG/2ML IV SOLN
INTRAVENOUS | Status: AC
  Filled 2015-10-13: qty 2

## 2015-10-13 MED ORDER — METFORMIN HCL 500 MG PO TABS
1000.0000 mg | ORAL_TABLET | Freq: Two times a day (BID) | ORAL | Status: DC
  Administered 2015-10-14: 500 mg via ORAL
  Filled 2015-10-13: qty 2

## 2015-10-13 MED ORDER — METHOCARBAMOL 1000 MG/10ML IJ SOLN
500.0000 mg | Freq: Four times a day (QID) | INTRAVENOUS | Status: DC | PRN
  Administered 2015-10-13: 500 mg via INTRAVENOUS
  Filled 2015-10-13: qty 5
  Filled 2015-10-13: qty 550

## 2015-10-13 MED ORDER — SODIUM CHLORIDE 0.9 % IV SOLN
INTRAVENOUS | Status: DC
  Administered 2015-10-13: 16:00:00 via INTRAVENOUS

## 2015-10-13 MED ORDER — LEVOTHYROXINE SODIUM 50 MCG PO TABS
50.0000 ug | ORAL_TABLET | Freq: Every day | ORAL | Status: DC
Start: 2015-10-14 — End: 2015-10-14
  Administered 2015-10-14: 50 ug via ORAL
  Filled 2015-10-13: qty 1

## 2015-10-13 MED ORDER — THROMBIN 5000 UNITS EX SOLR
CUTANEOUS | Status: AC
  Filled 2015-10-13: qty 10000

## 2015-10-13 MED ORDER — POLYETHYLENE GLYCOL 3350 17 G PO PACK: 17.0000 g | PACK | Freq: Every day | ORAL | Status: DC | PRN

## 2015-10-13 MED ORDER — HYDROCODONE-ACETAMINOPHEN 5-325 MG PO TABS
1.0000 | ORAL_TABLET | ORAL | Status: DC | PRN
  Administered 2015-10-13 – 2015-10-14 (×3): 2 via ORAL
  Filled 2015-10-13 (×3): qty 2

## 2015-10-13 MED ORDER — MIDAZOLAM HCL 2 MG/2ML IJ SOLN
INTRAMUSCULAR | Status: AC
  Filled 2015-10-13: qty 2

## 2015-10-13 MED ORDER — SODIUM CHLORIDE 0.9 % IR SOLN
Status: AC
  Filled 2015-10-13: qty 500000

## 2015-10-13 MED ORDER — IBUPROFEN 200 MG PO TABS: 200.0000 mg | ORAL_TABLET | Freq: Four times a day (QID) | ORAL | 0 refills | Status: DC | PRN

## 2015-10-13 MED ORDER — ONDANSETRON HCL 4 MG/2ML IJ SOLN: 4.0000 mg | INTRAMUSCULAR | Status: DC | PRN

## 2015-10-13 MED ORDER — LACTATED RINGERS IV SOLN
INTRAVENOUS | Status: DC | PRN
  Administered 2015-10-13: 07:00:00 via INTRAVENOUS

## 2015-10-13 MED ORDER — PHENYLEPHRINE 40 MCG/ML (10ML) SYRINGE FOR IV PUSH (FOR BLOOD PRESSURE SUPPORT)
PREFILLED_SYRINGE | INTRAVENOUS | Status: AC
  Filled 2015-10-13: qty 10

## 2015-10-13 MED ORDER — RISAQUAD PO CAPS
1.0000 | ORAL_CAPSULE | Freq: Every day | ORAL | Status: DC
  Administered 2015-10-13 – 2015-10-14 (×2): 1 via ORAL
  Filled 2015-10-13 (×2): qty 1

## 2015-10-13 MED ORDER — SODIUM CHLORIDE 0.9 % IV BOLUS (SEPSIS)
250.0000 mL | Freq: Once | INTRAVENOUS | Status: AC
  Administered 2015-10-13: 250 mL via INTRAVENOUS

## 2015-10-13 MED ORDER — LIDOCAINE HCL (CARDIAC) 20 MG/ML IV SOLN
INTRAVENOUS | Status: DC | PRN
  Administered 2015-10-13: 100 mg via INTRAVENOUS
  Administered 2015-10-13: 30 mg via INTRAVENOUS

## 2015-10-13 MED ORDER — CEFAZOLIN SODIUM-DEXTROSE 2-4 GM/100ML-% IV SOLN
2.0000 g | Freq: Three times a day (TID) | INTRAVENOUS | Status: AC
  Administered 2015-10-13 – 2015-10-14 (×3): 2 g via INTRAVENOUS
  Filled 2015-10-13 (×3): qty 100

## 2015-10-13 MED ORDER — PHENYLEPHRINE 40 MCG/ML (10ML) SYRINGE FOR IV PUSH (FOR BLOOD PRESSURE SUPPORT)
PREFILLED_SYRINGE | INTRAVENOUS | Status: DC | PRN
  Administered 2015-10-13 (×3): 40 ug via INTRAVENOUS

## 2015-10-13 MED ORDER — DOCUSATE SODIUM 100 MG PO CAPS: 100.0000 mg | ORAL_CAPSULE | Freq: Two times a day (BID) | ORAL | 1 refills | Status: DC

## 2015-10-13 MED ORDER — MAGNESIUM CITRATE PO SOLN
1.0000 | Freq: Once | ORAL | Status: DC | PRN
Start: 2015-10-13 — End: 2015-10-14

## 2015-10-13 MED ORDER — MIDAZOLAM HCL 2 MG/2ML IJ SOLN: 0.5000 mg | Freq: Once | INTRAMUSCULAR | Status: DC | PRN

## 2015-10-13 MED ORDER — EPHEDRINE 5 MG/ML INJ
INTRAVENOUS | Status: AC
  Filled 2015-10-13: qty 10

## 2015-10-13 MED ORDER — NITROGLYCERIN 0.4 MG SL SUBL: 0.4000 mg | SUBLINGUAL_TABLET | SUBLINGUAL | Status: DC | PRN

## 2015-10-13 MED ORDER — DOCUSATE SODIUM 100 MG PO CAPS
100.0000 mg | ORAL_CAPSULE | Freq: Two times a day (BID) | ORAL | Status: DC
  Administered 2015-10-13 – 2015-10-14 (×2): 100 mg via ORAL
  Filled 2015-10-13 (×2): qty 1

## 2015-10-13 MED ORDER — HYDROMORPHONE HCL 1 MG/ML IJ SOLN
INTRAMUSCULAR | Status: AC
  Filled 2015-10-13: qty 1

## 2015-10-13 MED ORDER — EPHEDRINE SULFATE-NACL 50-0.9 MG/10ML-% IV SOSY
PREFILLED_SYRINGE | INTRAVENOUS | Status: DC | PRN
  Administered 2015-10-13: 5 mg via INTRAVENOUS
  Administered 2015-10-13 (×2): 10 mg via INTRAVENOUS

## 2015-10-13 MED ORDER — HYDROMORPHONE HCL 1 MG/ML IJ SOLN
0.5000 mg | INTRAMUSCULAR | Status: DC | PRN
  Administered 2015-10-13: 0.5 mg via INTRAVENOUS
  Filled 2015-10-13: qty 1

## 2015-10-13 MED ORDER — ROCURONIUM BROMIDE 10 MG/ML (PF) SYRINGE
PREFILLED_SYRINGE | INTRAVENOUS | Status: DC | PRN
  Administered 2015-10-13 (×2): 10 mg via INTRAVENOUS
  Administered 2015-10-13: 30 mg via INTRAVENOUS

## 2015-10-13 MED ORDER — ONDANSETRON HCL 4 MG/2ML IJ SOLN
INTRAMUSCULAR | Status: DC | PRN
  Administered 2015-10-13: 4 mg via INTRAVENOUS

## 2015-10-13 MED ORDER — VALACYCLOVIR HCL 500 MG PO TABS: 1000.0000 mg | ORAL_TABLET | Freq: Two times a day (BID) | ORAL | Status: DC | PRN

## 2015-10-13 MED ORDER — HEMOSTATIC AGENTS (NO CHARGE) OPTIME
TOPICAL | Status: DC | PRN
  Administered 2015-10-13: 1 via TOPICAL

## 2015-10-13 MED ORDER — OXYCODONE-ACETAMINOPHEN 5-325 MG PO TABS
1.0000 | ORAL_TABLET | ORAL | Status: DC | PRN
  Administered 2015-10-13: 1 via ORAL
  Filled 2015-10-13: qty 2

## 2015-10-13 MED ORDER — DEXAMETHASONE SODIUM PHOSPHATE 10 MG/ML IJ SOLN
INTRAMUSCULAR | Status: AC
Start: 2015-10-13 — End: 2015-10-13
  Filled 2015-10-13: qty 1

## 2015-10-13 MED ORDER — ASPIRIN 81 MG PO TABS: 81.0000 mg | ORAL_TABLET | Freq: Every day | ORAL | Status: DC

## 2015-10-13 MED ORDER — ONDANSETRON HCL 4 MG/2ML IJ SOLN
INTRAMUSCULAR | Status: AC
  Filled 2015-10-13: qty 2

## 2015-10-13 MED ORDER — TRAMADOL HCL 50 MG PO TABS: 50.0000 mg | ORAL_TABLET | Freq: Four times a day (QID) | ORAL | 0 refills | Status: DC | PRN

## 2015-10-13 MED ORDER — PANTOPRAZOLE SODIUM 40 MG PO TBEC
40.0000 mg | DELAYED_RELEASE_TABLET | Freq: Every day | ORAL | Status: DC
Start: 2015-10-14 — End: 2015-10-14
  Administered 2015-10-14: 40 mg via ORAL
  Filled 2015-10-13: qty 1

## 2015-10-13 MED ORDER — INSULIN ASPART 100 UNIT/ML ~~LOC~~ SOLN
0.0000 [IU] | Freq: Three times a day (TID) | SUBCUTANEOUS | Status: DC
  Administered 2015-10-13: 11 [IU] via SUBCUTANEOUS
  Administered 2015-10-14: 3 [IU] via SUBCUTANEOUS

## 2015-10-13 MED ORDER — PROMETHAZINE HCL 25 MG/ML IJ SOLN: 6.2500 mg | INTRAMUSCULAR | Status: DC | PRN

## 2015-10-13 MED ORDER — DEXAMETHASONE SODIUM PHOSPHATE 10 MG/ML IJ SOLN
INTRAMUSCULAR | Status: DC | PRN
  Administered 2015-10-13: 10 mg via INTRAVENOUS

## 2015-10-13 MED ORDER — POLYMYXIN B SULFATE 500000 UNITS IJ SOLR
INTRAMUSCULAR | Status: DC | PRN
  Administered 2015-10-13: 500 mL

## 2015-10-13 MED ORDER — HYDROMORPHONE HCL 1 MG/ML IJ SOLN
0.2500 mg | INTRAMUSCULAR | Status: DC | PRN
  Administered 2015-10-13 (×2): 0.5 mg via INTRAVENOUS

## 2015-10-13 MED ORDER — CEFAZOLIN SODIUM-DEXTROSE 2-4 GM/100ML-% IV SOLN
INTRAVENOUS | Status: AC
  Filled 2015-10-13: qty 100

## 2015-10-13 MED ORDER — ACETAMINOPHEN 325 MG PO TABS: 650.0000 mg | ORAL_TABLET | ORAL | Status: DC | PRN

## 2015-10-13 MED ORDER — POLYETHYLENE GLYCOL 3350 17 G PO PACK: 17.0000 g | PACK | Freq: Every day | ORAL | 0 refills | Status: DC

## 2015-10-13 MED ORDER — COLESEVELAM HCL 625 MG PO TABS
2500.0000 mg | ORAL_TABLET | Freq: Every day | ORAL | Status: DC
  Administered 2015-10-13 – 2015-10-14 (×2): 2500 mg via ORAL
  Filled 2015-10-13 (×2): qty 4

## 2015-10-13 MED ORDER — LINAGLIPTIN 5 MG PO TABS
5.0000 mg | ORAL_TABLET | Freq: Every day | ORAL | Status: DC
  Filled 2015-10-13 (×2): qty 1

## 2015-10-13 MED ORDER — MEPERIDINE HCL 50 MG/ML IJ SOLN: 6.2500 mg | INTRAMUSCULAR | Status: DC | PRN

## 2015-10-13 MED ORDER — GLIPIZIDE 5 MG PO TABS
2.5000 mg | ORAL_TABLET | Freq: Every day | ORAL | Status: DC
  Administered 2015-10-14: 2.5 mg via ORAL
  Filled 2015-10-13: qty 0.5

## 2015-10-13 MED ORDER — FENTANYL CITRATE (PF) 100 MCG/2ML IJ SOLN
INTRAMUSCULAR | Status: DC | PRN
  Administered 2015-10-13: 25 ug via INTRAVENOUS
  Administered 2015-10-13 (×2): 50 ug via INTRAVENOUS
  Administered 2015-10-13: 25 ug via INTRAVENOUS

## 2015-10-13 MED ORDER — ACETAMINOPHEN 650 MG RE SUPP: 650.0000 mg | RECTAL | Status: DC | PRN

## 2015-10-13 MED ORDER — LABETALOL HCL 5 MG/ML IV SOLN
INTRAVENOUS | Status: AC
  Filled 2015-10-13: qty 4

## 2015-10-13 MED ORDER — ROCURONIUM BROMIDE 10 MG/ML (PF) SYRINGE
PREFILLED_SYRINGE | INTRAVENOUS | Status: AC
  Filled 2015-10-13: qty 10

## 2015-10-13 MED ORDER — TAMOXIFEN CITRATE 10 MG PO TABS
20.0000 mg | ORAL_TABLET | Freq: Every day | ORAL | Status: DC
  Administered 2015-10-14: 20 mg via ORAL
  Filled 2015-10-13: qty 2

## 2015-10-13 MED ORDER — LABETALOL HCL 5 MG/ML IV SOLN
5.0000 mg | INTRAVENOUS | Status: DC | PRN
  Administered 2015-10-13: 5 mg via INTRAVENOUS

## 2015-10-13 SURGICAL SUPPLY — 52 items
BAG ZIPLOCK 12X15 (MISCELLANEOUS) ×3 IMPLANT
CLEANER TIP ELECTROSURG 2X2 (MISCELLANEOUS) ×3 IMPLANT
CLOSURE WOUND 1/2 X4 (GAUZE/BANDAGES/DRESSINGS) ×1
CLOTH 2% CHLOROHEXIDINE 3PK (PERSONAL CARE ITEMS) ×3 IMPLANT
DRAPE MICROSCOPE LEICA (MISCELLANEOUS) ×3 IMPLANT
DRAPE SHEET LG 3/4 BI-LAMINATE (DRAPES) ×3 IMPLANT
DRAPE SURG 17X11 SM STRL (DRAPES) ×3 IMPLANT
DRAPE UTILITY XL STRL (DRAPES) ×3 IMPLANT
DRSG AQUACEL AG ADV 3.5X 4 (GAUZE/BANDAGES/DRESSINGS) IMPLANT
DRSG AQUACEL AG ADV 3.5X 6 (GAUZE/BANDAGES/DRESSINGS) ×3 IMPLANT
DURAPREP 26ML APPLICATOR (WOUND CARE) ×3 IMPLANT
ELECT BLADE TIP CTD 4 INCH (ELECTRODE) ×3 IMPLANT
ELECT REM PT RETURN 9FT ADLT (ELECTROSURGICAL) ×3
ELECTRODE REM PT RTRN 9FT ADLT (ELECTROSURGICAL) ×1 IMPLANT
GLOVE BIOGEL PI IND STRL 7.0 (GLOVE) ×1 IMPLANT
GLOVE BIOGEL PI IND STRL 7.5 (GLOVE) ×1 IMPLANT
GLOVE BIOGEL PI INDICATOR 7.0 (GLOVE) ×2
GLOVE BIOGEL PI INDICATOR 7.5 (GLOVE) ×2
GLOVE SURG SS PI 7.0 STRL IVOR (GLOVE) ×3 IMPLANT
GLOVE SURG SS PI 7.5 STRL IVOR (GLOVE) ×3 IMPLANT
GLOVE SURG SS PI 8.0 STRL IVOR (GLOVE) ×6 IMPLANT
GOWN SPEC L3 XXLG W/TWL (GOWN DISPOSABLE) ×3 IMPLANT
GOWN STRL REUS W/TWL XL LVL3 (GOWN DISPOSABLE) ×6 IMPLANT
HEMOSTAT SPONGE AVITENE ULTRA (HEMOSTASIS) ×3 IMPLANT
IV CATH 14GX2 1/4 (CATHETERS) ×3 IMPLANT
KIT BASIN OR (CUSTOM PROCEDURE TRAY) ×3 IMPLANT
KIT POSITIONING SURG ANDREWS (MISCELLANEOUS) ×3 IMPLANT
MANIFOLD NEPTUNE II (INSTRUMENTS) ×3 IMPLANT
MARKER SKIN DUAL TIP RULER LAB (MISCELLANEOUS) ×3 IMPLANT
NEEDLE SPNL 18GX3.5 QUINCKE PK (NEEDLE) ×6 IMPLANT
PACK LAMINECTOMY ORTHO (CUSTOM PROCEDURE TRAY) ×3 IMPLANT
PATTIES SURGICAL .5 X.5 (GAUZE/BANDAGES/DRESSINGS) ×3 IMPLANT
PATTIES SURGICAL .75X.75 (GAUZE/BANDAGES/DRESSINGS) ×3 IMPLANT
PATTIES SURGICAL 1X1 (DISPOSABLE) IMPLANT
RUBBERBAND STERILE (MISCELLANEOUS) ×6 IMPLANT
SPONGE LAP 4X18 X RAY DECT (DISPOSABLE) IMPLANT
STAPLER VISISTAT (STAPLE) ×3 IMPLANT
STRIP CLOSURE SKIN 1/2X4 (GAUZE/BANDAGES/DRESSINGS) ×2 IMPLANT
SUT NURALON 4 0 TR CR/8 (SUTURE) IMPLANT
SUT PROLENE 3 0 PS 2 (SUTURE) ×3 IMPLANT
SUT STRATAFIX 1PDS 45CM VIOLET (SUTURE) ×3 IMPLANT
SUT VIC AB 1 CT1 27 (SUTURE) ×4
SUT VIC AB 1 CT1 27XBRD ANTBC (SUTURE) ×2 IMPLANT
SUT VIC AB 1-0 CT2 27 (SUTURE) ×3 IMPLANT
SUT VIC AB 2-0 CT1 27 (SUTURE) ×2
SUT VIC AB 2-0 CT1 TAPERPNT 27 (SUTURE) ×1 IMPLANT
SUT VIC AB 2-0 CT2 27 (SUTURE) IMPLANT
SYR 3ML LL SCALE MARK (SYRINGE) ×3 IMPLANT
TOWEL OR 17X26 10 PK STRL BLUE (TOWEL DISPOSABLE) ×3 IMPLANT
TOWEL OR NON WOVEN STRL DISP B (DISPOSABLE) ×3 IMPLANT
TRAY FOLEY W/METER SILVER 14FR (SET/KITS/TRAYS/PACK) ×3 IMPLANT
YANKAUER SUCT BULB TIP NO VENT (SUCTIONS) ×3 IMPLANT

## 2015-10-13 NOTE — Anesthesia Preprocedure Evaluation (Addendum)
Anesthesia Evaluation  Patient identified by MRN, date of birth, ID band Patient awake    Reviewed: Allergy & Precautions, NPO status , Patient's Chart, lab work & pertinent test results  History of Anesthesia Complications Negative for: history of anesthetic complications  Airway Mallampati: II  TM Distance: >3 FB Neck ROM: Full    Dental  (+) Dental Advisory Given   Pulmonary former smoker (quit 1999),    breath sounds clear to auscultation       Cardiovascular hypertension, Pt. on medications (-) angina+ CAD  + Valvular Problems/Murmurs AS  Rhythm:Regular Rate:Normal + Systolic murmurs 5/88 EF 50-27%, mild aortic stenosis. Mean gradient   (S): 16 mm Hg. Valve area (VTI): 1.21 cm^2. Valve area (Vmax): 1.02 cm^2.    Neuro/Psych Anxiety Depression    GI/Hepatic GERD  Medicated and Controlled,(+) Hepatitis -  Endo/Other  diabetes (glu 153), Oral Hypoglycemic AgentsHypothyroidism   Renal/GU      Musculoskeletal   Abdominal   Peds  Hematology   Anesthesia Other Findings Breast cancer  Reproductive/Obstetrics                            Anesthesia Physical Anesthesia Plan  ASA: III  Anesthesia Plan: General   Post-op Pain Management:    Induction: Intravenous  Airway Management Planned: Oral ETT  Additional Equipment:   Intra-op Plan:   Post-operative Plan: Extubation in OR  Informed Consent: I have reviewed the patients History and Physical, chart, labs and discussed the procedure including the risks, benefits and alternatives for the proposed anesthesia with the patient or authorized representative who has indicated his/her understanding and acceptance.   Dental advisory given  Plan Discussed with: CRNA and Surgeon  Anesthesia Plan Comments: (Plan routine monitors, GETA)        Anesthesia Quick Evaluation

## 2015-10-13 NOTE — Progress Notes (Signed)
Dr [name] aware ST continues in pacu with rate bet 110-120.  Fluid bolus of NS 250 cc order received

## 2015-10-13 NOTE — Discharge Instructions (Signed)

## 2015-10-13 NOTE — Anesthesia Postprocedure Evaluation (Signed)
Anesthesia Post Note  Patient: JISELL MAJER  Procedure(s) Performed: Procedure(s) (LRB): MICRO LUMBAR DECOMPRESSION L5 - S1 AND REDO DECOMPRESSION L4 - L5 AND REMOVAL OF FACET CYST L4 - L5 2 LEVELS (Bilateral)  Patient location during evaluation: PACU Anesthesia Type: General Level of consciousness: awake and alert, oriented and patient cooperative Pain management: pain level controlled Vital Signs Assessment: post-procedure vital signs reviewed and stable Respiratory status: spontaneous breathing, nonlabored ventilation, respiratory function stable and patient connected to nasal cannula oxygen Cardiovascular status: blood pressure returned to baseline and stable Postop Assessment: no signs of nausea or vomiting Anesthetic complications: no    Last Vitals:  Vitals:   10/13/15 1508 10/13/15 1615  BP: 138/68 139/61  Pulse: (!) 108 (!) 106  Resp: 16 16  Temp: 37.2 C 37.1 C    Last Pain:  Vitals:   10/13/15 1615  TempSrc: Oral  PainSc:                  Shamari Trostel,E. Stanislaw Acton

## 2015-10-13 NOTE — Interval H&P Note (Signed)
History and Physical Interval Note:  10/13/2015 7:24 AM  [name]  has presented today for surgery, with the diagnosis of spinal stenosis L4 - L5 and L5 - S1  Facet cyst L4 - L5  The various methods of treatment have been discussed with the patient and family. After consideration of risks, benefits and other options for treatment, the patient has consented to  Procedure(s): MICRO LUMBAR DECOMPRESSION L5 - S1 AND REDO DECOMPRESSION L4 - L5 AND REMOVAL OF FACET CYST L4 - L5 2 LEVELS (N/A) as a surgical intervention .  The patient's history has been reviewed, patient examined, no change in status, stable for surgery.  I have reviewed the patient's chart and labs.  Questions were answered to the patient's satisfaction.     Lily Velasquez C

## 2015-10-13 NOTE — Anesthesia Procedure Notes (Signed)
Procedure Name: Intubation Date/Time: 10/13/2015 7:41 AM Performed by: Carleene Cooper A Pre-anesthesia Checklist: Patient identified, Timeout performed, Emergency Drugs available, Patient being monitored and Suction available Patient Re-evaluated:Patient Re-evaluated prior to inductionOxygen Delivery Method: Circle system utilized Preoxygenation: Pre-oxygenation with 100% oxygen Intubation Type: IV induction Ventilation: Mask ventilation without difficulty Laryngoscope Size: Mac and 4 Grade View: Grade I Tube type: Oral Tube size: 7.5 mm Number of attempts: 2 Airway Equipment and Method: Stylet Placement Confirmation: ETT inserted through vocal cords under direct vision,  positive ETCO2 and breath sounds checked- equal and bilateral Secured at: 21 cm Tube secured with: Tape Dental Injury: Teeth and Oropharynx as per pre-operative assessment

## 2015-10-13 NOTE — Progress Notes (Signed)
Dr [name] at bedside. Aware of HR of 122. Will monitor in pacu.

## 2015-10-13 NOTE — Progress Notes (Signed)
Dr [name] at bedside.  Order received to give labetalol 5 mg  Before discharge from pacu

## 2015-10-13 NOTE — Transfer of Care (Signed)
Immediate Anesthesia Transfer of Care Note  Patient: [name]  Procedure(s) Performed: Procedure(s): MICRO LUMBAR DECOMPRESSION L5 - S1 AND REDO DECOMPRESSION L4 - L5 AND REMOVAL OF FACET CYST L4 - L5 2 LEVELS (Bilateral)  Patient Location: PACU  Anesthesia Type:General  Level of Consciousness: awake, alert , oriented and patient cooperative  Airway & Oxygen Therapy: Patient Spontanous Breathing and Patient connected to face mask oxygen  Post-op Assessment: Report given to RN, Post -op Vital signs reviewed and stable and Patient moving all extremities  Post vital signs: Reviewed and stable  Last Vitals:  Vitals:   10/13/15 0520 10/13/15 0951  BP: (!) 179/76 (!) (P) 171/69  Pulse: 84 (!) 123  Resp: 16 (!) 22  Temp: 36.4 C (P) 36.5 C    Last Pain:  Vitals:   10/13/15 0520  TempSrc: Oral         Complications: No apparent anesthesia complications

## 2015-10-13 NOTE — H&P (View-Only) (Signed)
[name] is an 75 y.o. female.   Chief Complaint: back and bilateral leg pain HPI: The patient is a 75 year old female who presents today for follow up of their back. The patient is being followed for their low back symptoms. They are now 7 1/2 months out from a flare up. Symptoms reported today include: pain. Current treatment includes: relative rest and activity modification. The following medication has been used for pain control: none. The patient presents today following right L4-5 facet injection x 2 weeks (only helped for a day or two).  [name] follows up today for her back. She is now two weeks out from a facet injection at L4-5 on the right. Dr. Nelva Bush was not able to aspirate that cyst, but he did inject. She reports that she had relief of essentially all of her leg pain for one or two days following the injection of her bilateral leg pain. After that the pain did return. She reports the pain bilateral legs. Both sides are equally severe. She denies any numbness or tingling. She is the worse when she is upright and walking. She is generally more comfortable at night and sometimes get some cramping in her calves. Sometimes she has to continue to move without at night or lying on her couch instead for more comfort. She is not currently taking anything for pain for her back. Reports she is seeing [name] tomorrow to discuss her diabetic medications. Her last A1c was 7.3. He has recently stopped her Invokana and decreased her metformin due to an increase in her creatinine and he has been talking to her, but maybe about starting her on insulin. She has an upcoming wedding for her granddaughter on September 16th. She also reports a history of a prior ganglion cyst in the wrist.  Past Medical History:  Diagnosis Date  . Anemia    childhood  . Anxiety   . Arterial stenosis (HCC)    mesenteric  . Arthritis   . Breast cancer ([place]) 12/05/10   R breast, inv mammary, in situ,ER/PR +,HER2  -  . Cancer ([place])   . Carcinoma of breast treated with adjuvant chemotherapy ([place])   . Coronary artery disease    stent 2007  . Depression   . Diabetes mellitus   . Difficulty sleeping   . Diverticulosis 12/10/03  . Elevated cholesterol   . GERD (gastroesophageal reflux disease)   . Heart murmur    secondary to chem. 2013  . Hepatitis    as an infant  . HSV-1 (herpes simplex virus 1) infection   . Hyperlipidemia   . Hypertension   . Hypothyroidism   . Osteoarthritis   . Pneumonia    2014 september  . 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 Spinal stenosis   . Thyroid disease     Past Surgical History:  Procedure Laterality Date  . BREAST LUMPECTOMY  1983   benign biopsy  . BREAST LUMPECTOMY Right 12/29/2010   snbx, ER/PR +, Her2 -, 0/1 node pos.  . CAROTID STENT  2007  . CARPAL TUNNEL RELEASE Bilateral 9/12,8,12  . CHOLECYSTECTOMY    . COLONOSCOPY N/A 02/09/2013   Procedure: COLONOSCOPY;  Surgeon: [name], MD;  Location: [facility];  Service: Endoscopy;  Laterality: N/A;  . CORONARY ANGIOPLASTY    . DILATION AND CURETTAGE OF UTERUS    . ESOPHAGOGASTRODUODENOSCOPY N/A 02/09/2013   Procedure: ESOPHAGOGASTRODUODENOSCOPY (EGD);  Surgeon: [name], MD;  Location: [facility];  Service: Endoscopy;  Laterality: N/A;  . FOOT SPURS    . HYSTEROSCOPY W/D&C N/A 09/11/2012   Procedure: DILATATION AND CURETTAGE ;  Surgeon: [name], MD;  Location: [facility];  Service: Gynecology;  Laterality: N/A;  . KNEE SURGERY  2007  . LUMBAR LAMINECTOMY/DECOMPRESSION MICRODISCECTOMY N/A 10/20/2014   Procedure: MICRO LUMBER DECOMPRESSION L3-4 L4-5;  Surgeon: [name], MD;  Location: [facility];  Service: Orthopedics;  Laterality: N/A;  . PORT-A-CATH REMOVAL  04/17/2011   Procedure: REMOVAL PORT-A-CATH;  Surgeon: [name], MD;  Location: [facility];  Service: General;  Laterality: Left;  . PORTACATH PLACEMENT  02/07/2011   Procedure:  INSERTION PORT-A-CATH;  Surgeon: [name], MD;  Location: [facility];  Service: General;  Laterality: N/A;    Family History  Problem Relation Age of Onset  . Throat cancer Father   . Alcoholism Father   . Heart attack Father   . Heart disease Mother   . Lymphoma Brother 60  . Cancer Son   . Heart attack Brother   . Diabetes Brother   . Hypertension Brother   . Colon cancer Neg Hx   . Stroke Neg Hx    Social History:  reports that she quit smoking about 17 years ago. She has a 80.00 pack-year smoking history. She has never used smokeless tobacco. She reports that she drinks about 3.6 oz of alcohol per week . She reports that she does not use drugs.  Allergies:  Allergies  Allergen Reactions  . Codeine Nausea And Vomiting and Other (See Comments)    Severe stomach cramps  . Antihistamines, Diphenhydramine-Type Other (See Comments)    Causes hyperactivity  . Statins Other (See Comments)    Joint pains  . Erythromycin Hives and Rash    Due to dental work about 50 years ago. Was used in packing and resulted in rash/hives inside and outside of mouth.     (Not in a hospital admission)  No results found for this or any previous visit (from the past 48 hour(s)). No results found.  Review of Systems  Constitutional: Negative.   HENT: Negative.   Eyes: Negative.   Respiratory: Negative.   Cardiovascular: Negative.   Gastrointestinal: Negative.   Genitourinary: Negative.   Musculoskeletal: Positive for back pain.  Skin: Negative.   Neurological: Positive for sensory change and focal weakness.  Psychiatric/Behavioral: Negative.     There were no vitals taken for this visit. Physical Exam  Constitutional: She is oriented to person, place, and time. She appears well-developed.  HENT:  Head: Normocephalic.  Eyes: Pupils are equal, round, and reactive to light.  Neck: Normal range of motion.  Cardiovascular: Normal rate.   Respiratory: Effort normal.  GI: Soft.   Musculoskeletal:  On exam, she is well nourished, well developed, awake, alert and oriented x3, in no acute distress. She is comfortably seated. Well-healed midline incision lower lumbar spine, nontender through the lumbar spinous processes, paraspinous musculature and lateral hips. She is mildly tender in the buttocks bilaterally. Positive straight leg raise bilaterally reproducing buttock and leg pain. EHL weakness bilaterally, 4+/5, otherwise strength is 5/5 through the lower extremities, hip flexors, quads, hamstrings, plantar flexion, dorsiflexion. No calf pain or sign of DVT.  Neurological: She is alert and oriented to person, place, and time.    Prior x-rays and MRI reviewed with recurrence stenosis at L4-5 as well as a small synovial cyst at the right facet joint at L4-5. Both of those  causing moderate central stenosis as well as encouragement on the right L5 nerve root. This is likely underestimated due to her position for the MRI. There is also at L5-S1 multifactorial central spinal stenosis, lateral recess stenosis and foraminal stenosis with mass effect on the exiting L5 roots. That is all due to a disc bulge osteophyte complex and facet arthrosis.  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 Bilateral lower extremity radicular pain, temporary relief following a facet injection L4-5 on the right two weeks ago.  We discussed relevant anatomy and etiology of her pain in detail. She does seem to have multiple factors contributing to her pain. She responded well to the facet injection two weeks ago at L4-5 on the right confirming that as a pain generator. She also has significant stenosis at L5-S1, has been refractory to an ESI at L5-S1. We discussed options and after discussion mutually have agreed to proceed with surgical decompression due to ongoing  neural tension signs and myotomal weakness, ongoing symptoms refractory to injections and medication. We would recommend decompression at L5-S1, redo decompression at L4-5 with removal of synovial cyst at L4-5. Discussed the procedure itself as well as risks, complications and alternatives. Discussed postop protocols. She could not tolerate Norco before. We will use Percocet for pain postoperatively for her. Given her a clearance letter to take to her appointment tomorrow with [name]. He is considering putting her on insulin. We discussed the importance of strict glycemic control around the time of surgery to help with healing and to delay chances of infection. We will await his clearance. If she needs clearance from her cardiologist, we will await for his guidance from that as well. We did obtain clearance from her cardiologist last time about a year ago. She has been stable with her cardiac status since then. She previously tested positive for staph, but negative for MRSA. Therefore, I have given her prescriptions for decolonization for her last surgery. We utilized for preoperative antibiotic prophylaxis. She will follow up postoperatively. She would like to wait till later in September for this to be scheduled due to her granddaughter's wedding, which is reasonable. She will follow up 10 to 14 days postop for staple removal. Call if any questions or concerns in the interim. I also discussed in the interim activity modifications and disc pressure management. The patient was seen in conjunction with Dr. Tonita Cong today.  Plan microlumbar decompression L5-S1, redo decompression L4-5, removal of facet cyst L4-5  BISSELL, Conley Rolls., PA-C for Dr. Tonita Cong 10/10/2015, 11:11 AM

## 2015-10-14 LAB — GLUCOSE, CAPILLARY
GLUCOSE-CAPILLARY: 185 mg/dL — AB (ref 65–99)
GLUCOSE-CAPILLARY: 186 mg/dL — AB (ref 65–99)

## 2015-10-14 LAB — BASIC METABOLIC PANEL
Anion gap: 6 (ref 5–15)
BUN: 21 mg/dL — AB (ref 6–20)
CALCIUM: 8.4 mg/dL — AB (ref 8.9–10.3)
CHLORIDE: 109 mmol/L (ref 101–111)
CO2: 24 mmol/L (ref 22–32)
CREATININE: 1.34 mg/dL — AB (ref 0.44–1.00)
GFR calc Af Amer: 45 mL/min — ABNORMAL LOW (ref >60)
GFR calc non Af Amer: 39 mL/min — ABNORMAL LOW (ref >60)
Glucose, Bld: 228 mg/dL — ABNORMAL HIGH (ref 65–99)
Potassium: 4.5 mmol/L (ref 3.5–5.1)
SODIUM: 139 mmol/L (ref 135–145)

## 2015-10-14 NOTE — Evaluation (Signed)
Occupational Therapy Evaluation Patient Details Name: PURVA VESSELL MRN: [id number] DOB: [birth year] Today's Date: 10/14/2015    History of Present Illness s/p redo L4-5 decompression, L5-S1 decompression, removal of facet cyst L4-5   Clinical Impression   This 75 year old female was admitted for the above surgery. All education was completed. No further OT is needed at this time    Follow Up Recommendations  No OT follow up;Supervision/Assistance - 24 hour    Equipment Recommendations  None recommended by OT    Recommendations for Other Services       Precautions / Restrictions Precautions Precautions: Back Restrictions Weight Bearing Restrictions: No      Mobility Bed Mobility               General bed mobility comments: supervision rolling, sidelying to sit  Transfers Overall transfer level: Needs assistance Equipment used: Rolling walker (2 wheeled) Transfers: Sit to/from Stand Sit to Stand: Supervision         General transfer comment: cues for hand placement and back precautions    Balance                                            ADL Overall ADL's : Needs assistance/impaired     Grooming: Wash/dry hands;Supervision/safety;Standing       Lower Body Bathing: Moderate assistance;Sit to/from stand       Lower Body Dressing: Maximal assistance;Sit to/from stand   Toilet Transfer: Min guard;Ambulation;RW;Comfort height toilet   Toileting- Clothing Manipulation and Hygiene: Min guard;Sit to/from stand         General ADL Comments: pt can perform UB adls with set up/supervision.  She tends to move quickly. Educated on back precautions and handout given.  Demonstrated sidestepping over tub.  Husband will assist pt as needed     Vision     Perception     Praxis      Pertinent Vitals/Pain Pain Assessment: Faces Faces Pain Scale: Hurts a little bit Pain Location: back Pain Descriptors / Indicators: Sore Pain  Intervention(s): Limited activity within patient's tolerance;Monitored during session;Repositioned     Hand Dominance     Extremity/Trunk Assessment Upper Extremity Assessment Upper Extremity Assessment: Overall WFL for tasks assessed           Communication Communication Communication: HOH   Cognition Arousal/Alertness: Awake/alert Behavior During Therapy: WFL for tasks assessed/performed Overall Cognitive Status: Within Functional Limits for tasks assessed                     General Comments       Exercises       Shoulder Instructions      Home Living Family/patient expects to be discharged to:: Private residence Living Arrangements: Spouse/significant other Available Help at Discharge: Family               Bathroom Shower/Tub: Tub/shower unit Shower/tub characteristics: Curtain Bathroom Toilet: Handicapped height     Home Equipment: Environmental consultant - 2 wheels;Shower seat          Prior Functioning/Environment Level of Independence: Independent                 OT Problem List:     OT Treatment/Interventions:      OT Goals(Current goals can be found in the care plan section) Acute Rehab OT Goals Patient Stated Goal: home;  back to being independent OT Goal Formulation: All assessment and education complete, DC therapy  OT Frequency:     Barriers to D/C:            Co-evaluation              End of Session    Activity Tolerance: Patient tolerated treatment well Patient left: in chair;with call bell/phone within reach;with chair alarm set   Time: 5501-5868 OT Time Calculation (min): 20 min Charges:  OT General Charges $OT Visit: 1 Procedure OT Evaluation $OT Eval Low Complexity: 1 Procedure G-Codes: OT G-codes **NOT FOR INPATIENT CLASS** Functional Assessment Tool Used: clinical judgment and observation Functional Limitation: Self care Self Care Current Status (Y5749): At least 60 percent but less than 80 percent impaired,  limited or restricted Self Care Goal Status (T5521): At least 60 percent but less than 80 percent impaired, limited or restricted Self Care Discharge Status [phone]): At least 60 percent but less than 80 percent impaired, limited or restricted  [facility] 10/14/2015, 8:36 AM  Lesle Chris, OTR/L [phone] 10/14/2015

## 2015-10-14 NOTE — Op Note (Signed)
[name], [name]               ACCOUNT NO.:  [id number]  MEDICAL RECORD NO.:  [id number]  LOCATION:  [facility]                         FACILITY:  [facility]  PHYSICIAN:  [name], M.D.    DATE OF BIRTH:  04-16-1940  DATE OF PROCEDURE:  10/13/2015 DATE OF DISCHARGE:                              OPERATIVE REPORT   PREOPERATIVE DIAGNOSIS:  Recurrent spinal stenosis, L4-L5, with synovial cyst at L4-L5 facets, spinal stenosis, L5 and S1.  POSTOPERATIVE DIAGNOSIS:  Recurrent spinal stenosis, L4-L5, with synovial cyst at L4-L5 facets, spinal stenosis, L5 and S1.  PROCEDURE PERFORMED: 1. Revision lumbar decompression at L4-L5 with a Gill laminectomy of     L5. 2. Bilateral foraminotomies at L4 and L5. 3. Excision of synovial cyst at L4-L5 bilaterally. 4. Microlumbar decompression, L5-S1 bilaterally with S1 and L5     foraminotomies.  ANESTHESIA:  General.  ASSISTANT:  Cleophas Dunker, PA.  HISTORY:  This is a 75 year old female, with history of lumbar decompression and __________passed the spinal stenosis, had recurrent spinal stenosis at L4-L5, __________ spinal stenosis at L5-S1 secondary to facet hypertrophy and a new synovial cyst particularly at L5-S1 on the right.  She had persistent myotomal weakness, dermatomal dysesthesias, indicated for decompression, failing conservative treatment.  Risks and benefits were discussed including bleeding, infection, damage to neurovascular structures, DVT, PE, anesthetic complications, no change in her symptoms, worsening symptoms, etc.  TECHNIQUE:  With the patient in supine position, after induction of adequate general anesthesia, 2 g of Kefzol, placed prone on the [place] frame.  All bony prominences were well padded, Foley to gravity. Previous surgical incision after prepping and draping was utilized. Incision extended caudally to S1, subcutaneous tissue was dissected. Electrocautery was utilized to achieve hemostasis.  Dorsolumbar  fascia identified and divided in line with skin incision.  Paraspinous muscle was elevated from the lamina at L4-L5 and L5-S1.  McCulloch retractor was placed.  Operating microscope was draped, brought on the surgical field.  We skeletonized the previous central laminotomies with a straight curette, identifying the facets at L4-L5.  The lamina at L4 and the interlaminar windows at L5-S1.  A Leksell rongeur were then utilized to remove the spinous process of L5 and portion of S1.  The patient had fairly dense bone.  I used combination of microcurette to mobilize detached ligamentum flavum from the caudad edge of L5.  Ligamentum flavum was removed from the interspace bilaterally.  There was hypertrophic ligamentum flavum, compressing the S1 nerve root in the lateral recess bilaterally.  We decompressed lateral recess in medial border of the pedicle and performed foraminotomies of S1.  Gill laminectomy was performed of L5, meticulously developed a plane between the previous area of epidural fibrosis and the cephalad edge of the lamina of L5.  Following this, we continued at __________L5, began to develop a plane of the facet __________ in the lateral recess, particularly at 5.  There was a large hypertrophic synovial cyst extending in the lateral recess compressing the L5 root.  Meticulously developing a plane between the cyst in the thecal sac.  We decompressed further lateral recess to the medial border of the pedicle for performing foraminotomies of L4 and L5,  removed the synovial cyst with a micropituitary.  There was no evidence of disk herniation.  Bipolar electrocautery was utilized to achieve hemostasis as was thrombin-soaked Gelfoam.  In a similar fashion, we decompressed the contralateral side. Small synovial cyst was noted at the cyst at L4-L5 as well.  Again, lateral recess stenosis was noted at L5 and L4.  No disk herniation was noted here as well.  Good restoration of thecal  sac, __________ passed freely up the foramen, it was at L4-L5 and S1 bilaterally and to just above the pedicle of L4 confirmed by confirmatory radiograph. Meticulous hemostasis was achieved.  Copious irrigation was utilized. No evidence of CSF leakage or active bleeding was noted.  Thrombin- soaked Gelfoam was placed in laminotomy defect.  We removed the [facility] retractor.  Paraspinous muscles inspected, no active bleeding was noted, reapproximated dorsolumbar fascia with 1 Vicryl in interrupted figure-of-eight sutures, subcu with 2-0, and skin with staples.  Sterile dressing was applied.  She was placed supine on the hospital bed, extubated without difficulty, and transported to the recovery room in satisfactory condition.  The patient tolerated the procedure well.  No complications.  Assistant, Cleophas Dunker, [place], who was there throughout the case for patient positioning, closure, gentle intermittent neural traction, and suction. Blood loss 50 mL.     [name], M.D.     Geralynn Rile  D:  10/13/2015  T:  10/13/2015  Job:  [id number]

## 2015-10-14 NOTE — Discharge Summary (Signed)
Physician Discharge Summary   Patient ID: [name] MRN: [id number] DOB/AGE: 03/01/1940 75 y.o.  Admit date: 10/13/2015 Discharge date: 10/14/2015  Primary Diagnosis:   spinal stenosis L4 - L5 and L5 - S1  Facet cyst L4 - L5  Admission Diagnoses:  Past Medical History:  Diagnosis Date  . Anemia    childhood  . Anxiety   . Arterial stenosis (HCC)    mesenteric  . Arthritis   . Breast cancer ([place]) 12/05/10   R breast, inv mammary, in situ,ER/PR +,HER2 -  . Cancer ([place])   . Carcinoma of breast treated with adjuvant chemotherapy ([place])   . Coronary artery disease    stent 2007  . Depression   . Diabetes mellitus   . Difficulty sleeping   . Diverticulosis 12/10/03  . Elevated cholesterol   . GERD (gastroesophageal reflux disease)   . Heart murmur    secondary to chem. 2013  . Hepatitis    as an infant  . HSV-1 (herpes simplex virus 1) infection   . Hyperlipidemia   . Hypertension   . Hypothyroidism   . Osteoarthritis   . Pneumonia    2014 september  . 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 Spinal stenosis   . Thyroid disease    Discharge Diagnoses:   Active Problems:   Spinal stenosis of lumbar region  Procedure:  Procedure(s) (LRB): MICRO LUMBAR DECOMPRESSION L5 - S1 AND REDO DECOMPRESSION L4 - L5 AND REMOVAL OF FACET CYST L4 - L5 2 LEVELS (Bilateral)   Consults: None  HPI:  see H&P    Laboratory Data: Hospital Outpatient Visit on 10/05/2015  Component Date Value Ref Range Status  . Sodium 10/05/2015 140  135 - 145 mmol/L Final  . Potassium 10/05/2015 5.2* 3.5 - 5.1 mmol/L Final  . Chloride 10/05/2015 107  101 - 111 mmol/L Final  . CO2 10/05/2015 24  22 - 32 mmol/L Final  . Glucose, Bld 10/05/2015 166* 65 - 99 mg/dL Final  . BUN 10/05/2015 22* 6 - 20 mg/dL Final  . Creatinine, Ser 10/05/2015 1.32* 0.44 - 1.00 mg/dL Final  . Calcium 10/05/2015 9.3  8.9 - 10.3 mg/dL Final  . GFR calc non Af Amer 10/05/2015 39* >60 mL/min Final  .  GFR calc Af Amer 10/05/2015 45* >60 mL/min Final   Comment: (NOTE) The eGFR has been calculated using the CKD EPI equation. This calculation has not been validated in all clinical situations. eGFR's persistently <60 mL/min signify possible Chronic Kidney Disease.   . Anion gap 10/05/2015 9  5 - 15 Final  . WBC 10/05/2015 6.6  4.0 - 10.5 K/uL Final  . RBC 10/05/2015 4.22  3.87 - 5.11 MIL/uL Final  . Hemoglobin 10/05/2015 12.3  12.0 - 15.0 g/dL Final  . HCT 10/05/2015 38.0  36.0 - 46.0 % Final  . MCV 10/05/2015 90.0  78.0 - 100.0 fL Final  . MCH 10/05/2015 29.1  26.0 - 34.0 pg Final  . MCHC 10/05/2015 32.4  30.0 - 36.0 g/dL Final  . RDW 10/05/2015 13.8  11.5 - 15.5 % Final  . Platelets 10/05/2015 248  150 - 400 K/uL Final  . ABO/RH(D) 10/13/2015 O NEG   Final  . Antibody Screen 10/13/2015 NEG   Final  . Sample Expiration 10/13/2015 10/16/2015   Final  . Extend sample reason 10/13/2015 NO TRANSFUSIONS OR PREGNANCY IN THE PAST 3 MONTHS   Final  . Hgb A1c MFr Bld 10/06/2015 7.4*  4.8 - 5.6 % Final   Comment: (NOTE)         Pre-diabetes: 5.7 - 6.4         Diabetes: >6.4         Glycemic control for adults with diabetes: <7.0   . Mean Plasma Glucose 10/06/2015 166  mg/dL Final   Comment: (NOTE) Performed At: [facility] [place], [place] [id number] Lindon Romp MD IO:[id number]    No results for input(s): HGB in the last 72 hours. No results for input(s): WBC, RBC, HCT, PLT in the last 72 hours.  Recent Labs  10/14/15 0405  NA 139  K 4.5  CL 109  CO2 24  BUN 21*  CREATININE 1.34*  GLUCOSE 228*  CALCIUM 8.4*   No results for input(s): LABPT, INR in the last 72 hours.  X-Rays:Dg Lumbar Spine 2-3 Views  Result Date: 10/05/2015 CLINICAL DATA:  Preoperative examination prior to micro lumbar decompression at L5-S1 and redo decompression at L4-5 and removal of a facet joint cyst at L4-5. EXAM: LUMBAR SPINE - 2-3 VIEW COMPARISON:  Intraoperative view of  the lumbar spine of October 20, 2014 FINDINGS: The numbering scheme is in accordance with the previous study. The lumbar vertebral bodies are preserved in height. There is stable mild dextrocurvature centered at L2-3. The pedicles and transverse processes are intact. There is disc space narrowing at L1-2, L2-3, and L3-4 which appears stable. There is no spondylolisthesis. There are anterior endplate osteophytes from L1 through L4. There is facet joint hypertrophy at L5-S1. There is calcification within the wall of the abdominal aorta. There surgical clips in the gallbladder fossa. IMPRESSION: Stable mild dextroscoliosis. Mild multilevel degenerative disc space narrowing with endplate osteophyte formation. No compression fracture or spondylolisthesis. Electronically Signed   By: David  Martinique M.D.   On: 10/05/2015 12:49   Dg Spine Portable 1 View  Result Date: 10/13/2015 CLINICAL DATA:  Surgical level L4-5, L5-S1. Please # spaces between the vertebrae EXAM: PORTABLE SPINE - 1 VIEW COMPARISON:  Radiograph 10/13/2015 FINDINGS: Using the numbering convention of the comparison radiograph, angled tip probes posterior to the L5 and S1 vertebral bodies. IMPRESSION: Intraoperative view as above. Electronically Signed   By: Suzy Bouchard M.D.   On: 10/13/2015 09:20   Dg Spine Portable 1 View  Result Date: 10/13/2015 CLINICAL DATA:  Surgical level L4-5, L5-S1 EXAM: PORTABLE SPINE - 1 VIEW COMPARISON:  10/05/2015 FINDINGS: Surgical resection of the spinous process of L4 in the past. Normal lumbar alignment Needle is located in the soft tissues posterior to the expected location of the L4 spinous process which has been removed Second needle in the soft tissues posterior to the L5 spinous process. IMPRESSION: L4 spinous process has been resected. Two needles in the soft tissues posterior to the expected location of the L4 spinous process, and the L5 spinous process. Electronically Signed   By: Franchot Gallo M.D.    On: 10/13/2015 08:08    EKG: Orders placed or performed during the hospital encounter of 09/10/15  . EKG 12-Lead  . EKG 12-Lead  . ED EKG  . ED EKG  . EKG     Hospital Course: Patient was admitted to [facility] and taken to the OR and underwent the above state procedure without complications.  Patient tolerated the procedure well and was later transferred to the recovery room and then to the orthopaedic floor for postoperative care.  They were given PO and IV analgesics for  pain control following their surgery.  They were given 24 hours of postoperative antibiotics.   PT was consulted postop to assist with mobility and transfers.  The patient was allowed to be WBAT with therapy and was taught back precautions. Discharge planning was consulted to help with postop disposition and equipment needs.  Patient had a good night on the evening of surgery and started to get up OOB with therapy on day one. Patient was seen in rounds and was ready to go home on day one.  They were given discharge instructions and dressing directions.  They were instructed on when to follow up in the office with Dr. Tonita Cong.   Diet: Diabetic diet Activity:WBAT; Lspine precautions Follow-up:in 10-14 days Disposition - Home Discharged Condition: good   Discharge Instructions    Call MD / Call 911    Complete by:  As directed    If you experience chest pain or shortness of breath, CALL 911 and be transported to the hospital emergency room.  If you develope a fever above 101 F, pus (white drainage) or increased drainage or redness at the wound, or calf pain, call your surgeon's office.   Constipation Prevention    Complete by:  As directed    Drink plenty of fluids.  Prune juice may be helpful.  You may use a stool softener, such as Colace (over the counter) 100 mg twice a day.  Use MiraLax (over the counter) for constipation as needed.   Diet - low sodium heart healthy    Complete by:  As directed    Increase  activity slowly as tolerated    Complete by:  As directed        Medication List    STOP taking these medications   Fish Oil 1000 MG Caps     TAKE these medications   aspirin 81 MG tablet Take 1 tablet (81 mg total) by mouth daily. Resume 4 days post-op What changed:  additional instructions  Another medication with the same name was removed. Continue taking this medication, and follow the directions you see here.   colesevelam 625 MG tablet Commonly known as:  WELCHOL Take 2,500 mg by mouth daily.   docusate sodium 100 MG capsule Commonly known as:  COLACE Take 1 capsule (100 mg total) by mouth 2 (two) times daily.   glipiZIDE 2.5 MG 24 hr tablet Commonly known as:  GLUCOTROL XL Take 2.5 mg by mouth daily with breakfast.   ibuprofen 200 MG tablet Commonly known as:  ADVIL,MOTRIN Take 1 tablet (200 mg total) by mouth every 6 (six) hours as needed for mild pain or moderate pain. Resume 5 days post-op as needed What changed:  additional instructions   levothyroxine 50 MCG tablet Commonly known as:  SYNTHROID, LEVOTHROID Take 50 mcg by mouth daily.   losartan 100 MG tablet Commonly known as:  COZAAR Take 100 mg by mouth daily.   metFORMIN 1000 MG tablet Commonly known as:  GLUCOPHAGE Take 500 mg by mouth 2 (two) times daily.   nitroGLYCERIN 0.4 MG SL tablet Commonly known as:  NITROSTAT Place 1 tablet (0.4 mg total) under the tongue every 5 (five) minutes as needed for chest pain.   oxyCODONE-acetaminophen 5-325 MG tablet Commonly known as:  ROXICET Take 1 tablet by mouth every 4 (four) hours as needed for severe pain.   pantoprazole 40 MG tablet Commonly known as:  PROTONIX Take 40 mg by mouth daily.   PARoxetine 20 MG tablet Commonly known as:  PAXIL Take 20 mg by mouth every morning.   polyethylene glycol packet Commonly known as:  MIRALAX Take 17 g by mouth daily.   sitaGLIPtin 100 MG tablet Commonly known as:  JANUVIA Take 100 mg by mouth  daily.   tamoxifen 20 MG tablet Commonly known as:  NOLVADEX Take 1 tablet by mouth  daily   valACYclovir 1000 MG tablet Commonly known as:  VALTREX Take 1,000 mg by mouth 2 (two) times daily as needed (for viral infection).      Follow-up Information    BEANE,JEFFREY C, MD Follow up in 2 week(s).   Specialty:  Orthopedic Surgery Contact information: [address] [place] [address] [phone]           Signed: Lacie Draft, PA-C Orthopaedic Surgery 10/14/2015, 1:17 PM

## 2015-10-14 NOTE — Progress Notes (Signed)
Subjective: 1 Day Post-Op Procedure(s) (LRB): MICRO LUMBAR DECOMPRESSION L5 - S1 AND REDO DECOMPRESSION L4 - L5 AND REMOVAL OF FACET CYST L4 - L5 2 LEVELS (Bilateral) Patient reports pain as mild. Reports she is unable to tell a difference in her pre-op pain yet as she has not been OOB much. Catheter removed and she has voided. No other c/o. No numbness, tingling, CP, SOB, N/V, dizziness. She feels much better overall than following her prior back surgery when she felt "doped up" and does feel she would be able to go home today. Pain well controlled with PO meds.   Objective: Vital signs in last 24 hours: Temp:  [97.7 F (36.5 C)-98.9 F (37.2 C)] 97.9 F (36.6 C) (09/22 0521) Pulse Rate:  [88-123] 88 (09/22 0521) Resp:  [13-22] 18 (09/22 0521) BP: (122-171)/(50-84) 124/84 (09/22 0521) SpO2:  [97 %-100 %] 100 % (09/22 0521)  Intake/Output from previous day: 09/21 0701 - 09/22 0700 In: 3891.7 [P.O.:920; I.V.:2721.7; IV Piggyback:250] Out: 9038 [Urine:4600; Blood:50] Intake/Output this shift: No intake/output data recorded.  No results for input(s): HGB in the last 72 hours. No results for input(s): WBC, RBC, HCT, PLT in the last 72 hours.  Recent Labs  10/14/15 0405  NA 139  K 4.5  CL 109  CO2 24  BUN 21*  CREATININE 1.34*  GLUCOSE 228*  CALCIUM 8.4*   No results for input(s): LABPT, INR in the last 72 hours.  Neurologically intact ABD soft Neurovascular intact Sensation intact distally Intact pulses distally Dorsiflexion/Plantar flexion intact Incision: dressing C/D/I and no drainage No cellulitis present Compartment soft no calf pain or sign of DVT  Assessment/Plan: 1 Day Post-Op Procedure(s) (LRB): MICRO LUMBAR DECOMPRESSION L5 - S1 AND REDO DECOMPRESSION L4 - L5 AND REMOVAL OF FACET CYST L4 - L5 2 LEVELS (Bilateral) Advance diet Up with therapy D/C IV fluids  Discussed D/C instructions, Lspine precautions, dressing instructions Glycemic control Plan  likely D/C later today following PT as long as pain remains well controlled Will discuss with Dr. Mliss Fritz, Conley Rolls. 10/14/2015, 7:35 AM

## 2015-10-14 NOTE — Progress Notes (Signed)
Pt with no HHPT needs and no DME needs per PT eval. No other CM needs communicated. Marney Doctor RN,BSN,NCM [phone]

## 2015-10-14 NOTE — Evaluation (Signed)
Physical Therapy Evaluation Patient Details Name: [name] MRN: [id number] DOB: 1940-06-18 Today's Date: 10/14/2015   History of Present Illness  Pt is a 75 y/o female s/p redo L4-5 decompression, L5-S1 decompression, removal of facet cyst L4-5  Clinical Impression  Pt evaluated by PT with no further acute PT needs identified. All education has been completed and the patient has no further questions. See below for any follow-up PT or equipment needs. PT is signing off. Thank you for this referral. Pt was able to perform all mobility with supervision and was able to ambulate in her room without rolling walker. Pt able to recall back precautions from OT session earlier and reports feeling ready to be d/c home to heal from surgery.    Follow Up Recommendations No PT follow up;Supervision for mobility/OOB    Equipment Recommendations  None recommended by PT    Recommendations for Other Services       Precautions / Restrictions Precautions Precautions: Back;Fall Restrictions Weight Bearing Restrictions: No      Mobility  Bed Mobility Overal bed mobility: Needs Assistance Bed Mobility: Rolling;Sit to Sidelying Rolling: Supervision       Sit to sidelying: Supervision General bed mobility comments: supervision for safety; verbal cues to adhere to spinal precautions, rolling as unit onto back from sidelying  Transfers Overall transfer level: Needs assistance Equipment used: Rolling walker (2 wheeled) Transfers: Sit to/from Stand Sit to Stand: Supervision         General transfer comment: supervision for safety; verbal cues for slow, controlled descent  Ambulation/Gait Ambulation/Gait assistance: Supervision Ambulation Distance (Feet): 260 Feet Assistive device: Rolling walker (2 wheeled);None (ambualted both with and without RW) Gait Pattern/deviations: Step-through pattern;Decreased stride length     General Gait Details: supervision for safety; ambulated with  and without RW; pt prefers RW for longer distances but was stable ambulating with RW in room   Stairs Stairs: Yes Stairs assistance: Supervision Stair Management: No rails;Forwards Number of Stairs: 1 General stair comments: supervision for safety; verbal cues to perform step at slow, controlled pace  Wheelchair Mobility    Modified Rankin (Stroke Patients Only)       Balance Overall balance assessment: No apparent balance deficits (not formally assessed)                                           Pertinent Vitals/Pain Pain Assessment: 0-10 Pain Score: 4  Pain Location: lower back Pain Descriptors / Indicators: Sore;Discomfort Pain Intervention(s): Limited activity within patient's tolerance;Monitored during session;Premedicated before session;Repositioned    Home Living Family/patient expects to be discharged to:: Private residence Living Arrangements: Spouse/significant other Available Help at Discharge: Family Type of Home: House Home Access: Stairs to enter   Technical brewer of Steps: 1 (has 5 at alternate entrance) Home Layout: One level Home Equipment: Walker - 2 wheels      Prior Function Level of Independence: Independent               Hand Dominance        Extremity/Trunk Assessment               Lower Extremity Assessment: Overall WFL for tasks assessed (performed ankle pumps and denied any radiating symptoms )         Communication   Communication: No difficulties  Cognition Arousal/Alertness: Awake/alert Behavior During Therapy: WFL for tasks assessed/performed Overall  Cognitive Status: Within Functional Limits for tasks assessed                      General Comments      Exercises     Assessment/Plan    PT Assessment Patent does not need any further PT services  PT Problem List            PT Treatment Interventions      PT Goals (Current goals can be found in the Care Plan section)   Acute Rehab PT Goals PT Goal Formulation: All assessment and education complete, DC therapy    Frequency     Barriers to discharge        Co-evaluation               End of Session Equipment Utilized During Treatment: Gait belt Activity Tolerance: Patient tolerated treatment well Patient left: in bed;with bed alarm set;with family/visitor present;with call bell/phone within reach      Functional Assessment Tool Used: clinical judgement Functional Limitation: Mobility: Walking and moving around Mobility: Walking and Moving Around Current Status ([id number]): At least 1 percent but less than 20 percent impaired, limited or restricted Mobility: Walking and Moving Around Goal Status [phone]): At least 1 percent but less than 20 percent impaired, limited or restricted Mobility: Walking and Moving Around Discharge Status [phone]): At least 1 percent but less than 20 percent impaired, limited or restricted    Time: 0903-0922 PT Time Calculation (min) (ACUTE ONLY): 19 min   Charges:   PT Evaluation $PT Eval Low Complexity: 1 Procedure     PT G Codes:   PT G-Codes **NOT FOR INPATIENT CLASS** Functional Assessment Tool Used: clinical judgement Functional Limitation: Mobility: Walking and moving around Mobility: Walking and Moving Around Current Status (F8403): At least 1 percent but less than 20 percent impaired, limited or restricted Mobility: Walking and Moving Around Goal Status [phone]): At least 1 percent but less than 20 percent impaired, limited or restricted Mobility: Walking and Moving Around Discharge Status [phone]): At least 1 percent but less than 20 percent impaired, limited or restricted    Dewitt Hoes 10/14/2015, 1:09 PM Dewitt Hoes, SPT

## 2015-11-09 NOTE — Progress Notes (Signed)
Patient ID: [name], female   DOB: 22-Mar-1940, 75 y.o.   MRN: [id number]    11/11/2015 [name]   1940-06-13  [id number]  Primary Physician [name], MD Primary Cardiologist: Dr Johnsie Cancel  HPI:  75 y.o.  female with a history of CAD, s/p RCA HSRA and DES (Taxus) in 2007. Cath in 2008 showed patent RCA and no significant Lt system disease. She has done well from a cardiac standpoint. She had breast cancer in 2012 and has not had recurrence. She had back surgery in Sept 2016 and has done well. She is in the office today for a 6 month check up. She denies any palpitations, dyspnea, or chest pain.   Studies reviewed from 06/13/15   Echo results-Normal EF mild AS mean gradient 16 mm Hg peak 26 mmHg  f/u echo in a year LE ultrasound results- Normal circulation Carotid duplex [id number]% LICA stenosis. F/U duplex in one year  Seen at urgent care 8/19 for chest pain.  Sounded more like indigestion  R/O no acute Changes d/c with carafate subsequently had uncomplicated back surgery    Current Outpatient Prescriptions  Medication Sig Dispense Refill  . aspirin 81 MG tablet Take 1 tablet (81 mg total) by mouth daily. Resume 4 days post-op    . colesevelam (WELCHOL) 625 MG tablet Take 2,500 mg by mouth daily.    Marland Kitchen docusate sodium (COLACE) 100 MG capsule Take 1 capsule (100 mg total) by mouth 2 (two) times daily. 30 capsule 1  . glipiZIDE (GLUCOTROL XL) 2.5 MG 24 hr tablet Take 2.5 mg by mouth daily with breakfast.    . ibuprofen (ADVIL,MOTRIN) 200 MG tablet Take 1 tablet (200 mg total) by mouth every 6 (six) hours as needed for mild pain or moderate pain. Resume 5 days post-op as needed  0  . levothyroxine (SYNTHROID, LEVOTHROID) 50 MCG tablet Take 50 mcg by mouth daily.    Marland Kitchen losartan (COZAAR) 100 MG tablet Take 50 mg by mouth daily.     . metFORMIN (GLUCOPHAGE) 1000 MG tablet Take 500 mg by mouth 2 (two) times daily.    . nitroGLYCERIN (NITROSTAT) 0.4 MG SL tablet Place 1  tablet (0.4 mg total) under the tongue every 5 (five) minutes as needed for chest pain. 25 tablet 3  . pantoprazole (PROTONIX) 40 MG tablet Take 40 mg by mouth daily.    Marland Kitchen PARoxetine (PAXIL) 20 MG tablet Take 20 mg by mouth every morning.    . polyethylene glycol (MIRALAX) packet Take 17 g by mouth daily. 14 each 0  . sitaGLIPtin (JANUVIA) 100 MG tablet Take 100 mg by mouth daily.    . tamoxifen (NOLVADEX) 20 MG tablet Take 1 tablet by mouth  daily 90 tablet 3  . valACYclovir (VALTREX) 1000 MG tablet Take 1,000 mg by mouth 2 (two) times daily as needed (for viral infection).     No current facility-administered medications for this visit.     Allergies  Allergen Reactions  . Codeine Nausea And Vomiting and Other (See Comments)    Severe stomach cramps  . Antihistamines, Diphenhydramine-Type Other (See Comments)    Causes hyperactivity  . Statins Other (See Comments)    Joint pains  . Erythromycin Hives and Rash    Due to dental work about 50 years ago. Was used in packing and resulted in rash/hives inside and outside of mouth.    Social History   Social History  . Marital status: Married    Spouse name:  N/A  . Number of children: 2  . Years of education: N/A   Occupational History  . retired    Social History Main Topics  . Smoking status: Former Smoker    Packs/day: 2.00    Years: 40.00    Quit date: 12/19/1997  . Smokeless tobacco: Never Used  . Alcohol use 3.6 oz/week    6 Glasses of wine per week     Comment: occasional  . Drug use: No  . Sexual activity: Not on file     Comment: menarch age 10, P3, HRT X 10 YRS, MENOPAUSE MID 40'S   Other Topics Concern  . Not on file   Social History Narrative   [name] age 4- [name] age 45  [place]     Review of Systems: General: negative for chills, fever, night sweats or weight changes.  Cardiovascular: negative for chest pain, dyspnea on exertion, edema, orthopnea, palpitations, paroxysmal nocturnal  dyspnea or shortness of breath Dermatological: negative for rash Respiratory: negative for cough or wheezing Urologic: negative for hematuria Abdominal: negative for nausea, vomiting, diarrhea, bright red blood per rectum, melena, or hematemesis Neurologic: negative for visual changes, syncope, or dizziness All other systems reviewed and are otherwise negative except as noted above.    Blood pressure 130/70, pulse 98, resp. rate 16, height 5\' 3"  (1.6 m), weight 68.9 kg (152 lb), SpO2 98 %.  General appearance: alert, cooperative and no distress Neck: no carotid bruit and no JVD Lungs: clear to auscultation bilaterally Heart: regular rate and rhythm and 2/6 systolic murmur AOV, preserved S2 Extremities: extremities normal, atraumatic, no cyanosis or edema Skin: Skin color, texture, turgor normal. No rashes or lesions Neurologic: Grossly normal   ECG:  12/27/14 SR rate 91 normal   ASSESSMENT AND PLAN:   CAD S/P PCI 2007 RCA HSRA and Taxus stent in 2007 by Dr Lia Foyer.  Re look cath in 2008 OK, no Lt system disease and RCA patent recent ER visit for chest pain ? GERD F/u exercise myovue   Bruit:  Left ICA 40-59% f/u duplex 05/2016  Essential hypertension Labile low in office and at home Hold lisinopril and re evaluate  Elevated lipids On Welchol, statin intolerant- LDL was 83 in 2010, followed by PCP  Mild aortic stenosis Echo 06/13/15 mean gradient 16 mmHg stable since 2015 f/u echo 05/2014  History of breast cancer Rt mastectomy Dec 2012 followed by chemo and radiation  Spinal stenosis of lumbar region Surgery Sept 2016 suspect her leg pain is from this  ABI's normal 06/13/15  GERD :  Continue protonix low carb diet finished with carafate    F/u with me in may with duplex    [name]

## 2015-11-11 ENCOUNTER — Other Ambulatory Visit: Payer: Self-pay | Admitting: Obstetrics and Gynecology

## 2015-11-11 ENCOUNTER — Ambulatory Visit (INDEPENDENT_AMBULATORY_CARE_PROVIDER_SITE_OTHER): Payer: Medicare Other | Admitting: Cardiovascular Disease

## 2015-11-11 VITALS — BP 130/70 | HR 98 | Resp 16 | Ht 63.0 in | Wt 152.0 lb

## 2015-11-11 DIAGNOSIS — I350 Nonrheumatic aortic (valve) stenosis: Secondary | ICD-10-CM | POA: Diagnosis not present

## 2015-11-11 DIAGNOSIS — R011 Cardiac murmur, unspecified: Secondary | ICD-10-CM | POA: Diagnosis not present

## 2015-11-11 DIAGNOSIS — I6523 Occlusion and stenosis of bilateral carotid arteries: Secondary | ICD-10-CM | POA: Diagnosis not present

## 2015-11-11 DIAGNOSIS — R0989 Other specified symptoms and signs involving the circulatory and respiratory systems: Secondary | ICD-10-CM | POA: Diagnosis not present

## 2015-11-11 DIAGNOSIS — Z853 Personal history of malignant neoplasm of breast: Secondary | ICD-10-CM

## 2015-11-11 NOTE — Patient Instructions (Signed)
Medication Instructions:  Your physician recommends that you continue on your current medications as directed. Please refer to the Current Medication list given to you today.  Labwork: NONE  Testing/Procedures: Your physician has requested that you have a carotid duplex in May. This test is an ultrasound of the carotid arteries in your neck. It looks at blood flow through these arteries that supply the brain with blood. Allow one hour for this exam. There are no restrictions or special instructions.  Your physician has requested that you have en exercise stress myoview. For further information please visit [email or web address]. Please follow instruction sheet, as given.   Follow-Up: Your physician wants you to follow-up in: 6 months with Dr. Johnsie Cancel. You will receive a reminder letter in the mail two months in advance. If you don't receive a letter, please call our office to schedule the follow-up appointment.   If you need a refill on your cardiac medications before your next appointment, please call your pharmacy.

## 2015-11-14 ENCOUNTER — Encounter: Payer: Self-pay | Admitting: Physical Therapy

## 2015-11-14 ENCOUNTER — Ambulatory Visit: Payer: Medicare Other | Attending: Specialist | Admitting: Physical Therapy

## 2015-11-14 DIAGNOSIS — R262 Difficulty in walking, not elsewhere classified: Secondary | ICD-10-CM | POA: Insufficient documentation

## 2015-11-14 DIAGNOSIS — M545 Low back pain, unspecified: Secondary | ICD-10-CM

## 2015-11-14 NOTE — Therapy (Signed)
[place] [address] [place], [place], [address] Phone: [phone]   Fax:  [phone]  Physical Therapy Evaluation  Patient Details  Name: MCKENZIE BOVE MRN: [id number] Date of Birth: 14-Aug-1940 Referring Provider: Tonita Cong  Encounter Date: 11/14/2015      PT End of Session - 11/14/15 0956    Visit Number 1   Date for PT Re-Evaluation 01/14/16   PT Start Time 0922   PT Stop Time 1014   PT Time Calculation (min) 52 min   Activity Tolerance Patient tolerated treatment well   Behavior During Therapy [facility] for tasks assessed/performed      Past Medical History:  Diagnosis Date  . Anemia    childhood  . Anxiety   . Arterial stenosis (HCC)    mesenteric  . Arthritis   . Breast cancer ([place]) 12/05/10   R breast, inv mammary, in situ,ER/PR +,HER2 -  . Cancer ([place])   . Carcinoma of breast treated with adjuvant chemotherapy ([place])   . Coronary artery disease    stent 2007  . Depression   . Diabetes mellitus   . Difficulty sleeping   . Diverticulosis 12/10/03  . Elevated cholesterol   . GERD (gastroesophageal reflux disease)   . Heart murmur    secondary to chem. 2013  . Hepatitis    as an infant  . HSV-1 (herpes simplex virus 1) infection   . Hyperlipidemia   . Hypertension   . Hypothyroidism   . Osteoarthritis   . Pneumonia    2014 september  . 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 Spinal stenosis   . Thyroid disease     Past Surgical History:  Procedure Laterality Date  . BREAST LUMPECTOMY  1983   benign biopsy  . BREAST LUMPECTOMY Right 12/29/2010   snbx, ER/PR +, Her2 -, 0/1 node pos.  . CAROTID STENT  2007  . CARPAL TUNNEL RELEASE Bilateral 9/12,8,12  . CHOLECYSTECTOMY    . COLONOSCOPY N/A 02/09/2013   Procedure: COLONOSCOPY;  Surgeon: [name], MD;  [facility];  Service: Endoscopy;  Laterality: N/A;  . CORONARY ANGIOPLASTY    . DILATION AND CURETTAGE  OF UTERUS    . ESOPHAGOGASTRODUODENOSCOPY N/A 02/09/2013   Procedure: ESOPHAGOGASTRODUODENOSCOPY (EGD);  Surgeon: [name], MD;  [name];  Service: Endoscopy;  Laterality: N/A;  . FOOT SPURS    . HYSTEROSCOPY W/D&C N/A 09/11/2012   Procedure: DILATATION AND CURETTAGE ;  Surgeon: [name], MD;  Location: [facility];  Service: Gynecology;  Laterality: N/A;  . KNEE SURGERY  2007  . LUMBAR LAMINECTOMY/DECOMPRESSION MICRODISCECTOMY N/A 10/20/2014   Procedure: MICRO LUMBER DECOMPRESSION L3-4 L4-5;  Surgeon: [name], MD;  [facility];  Service: Orthopedics;  Laterality: N/A;  . LUMBAR LAMINECTOMY/DECOMPRESSION MICRODISCECTOMY Bilateral 10/13/2015   Procedure: MICRO LUMBAR DECOMPRESSION L5 - S1 AND REDO DECOMPRESSION L4 - L5 AND REMOVAL OF FACET CYST L4 - L5 2 LEVELS;  Surgeon: [name], MD;  [facility];  Service: Orthopedics;  Laterality: Bilateral;  . PORT-A-CATH REMOVAL  04/17/2011   Procedure: REMOVAL PORT-A-CATH;  Surgeon: [name], MD;  [facility];  Service: General;  Laterality: Left;  . PORTACATH PLACEMENT  02/07/2011   Procedure: INSERTION PORT-A-CATH;  Surgeon: [name], MD;  [facility];  Service: General;  Laterality: N/A;    There were no vitals filed for this visit.  Subjective Assessment - 11/14/15 0929    Subjective Patient had a lumbar decompression September 2016, she reports that she had about 3 months of relief and then pain started again, she underwent a lumbar decompression 10/13/15, L4-S1.  She reports that she has no pain and feels pretty good.   Limitations Lifting;House hold activities   Patient Stated Goals get back to all ADL's without difficulty   Currently in Pain? Yes   Pain Score 1    Pain Location Back   Pain Orientation Lower   Pain Descriptors / Indicators Sore   Pain Type Surgical pain   Pain Onset More than a month ago   Pain Frequency Intermittent   Aggravating  Factors  bending and lifting will increase pain up to 5/10   Pain Relieving Factors rest   Effect of Pain on Daily Activities some limitation with ADL's            [facility] PT Assessment - 11/14/15 0001      Assessment   Medical Diagnosis s/p lumbar decompression   Referring Provider Beane   Onset Date/Surgical Date 10/13/15   Prior Therapy no     Precautions   Precautions None     Balance Screen   Has the patient fallen in the past 6 months No   Has the patient had a decrease in activity level because of a fear of falling?  No   Is the patient reluctant to leave their home because of a fear of falling?  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, water Comments fwd head, rounded shoulders     ROM / Strength   AROM / PROM / Strength AROM     AROM   Overall AROM Comments Lumbar ROM was decreaed 25% with c/o soreness and pulling     Flexibility   Soft Tissue Assessment /Muscle Length --  mild tightness in the HS, calf and piriformis     Palpation   Palpation comment very tight in the lumbar paraspinals, some soreness in the right SI area                   [facility] Adult PT Treatment/Exercise - 11/14/15 0001      Exercises   Exercises Lumbar     Lumbar Exercises: Aerobic   Stationary Bike Nustep Level 4 x 5 minutes     Lumbar Exercises: Seated   Other Seated Lumbar Exercises on sit fit pelvic mobility and some light stability                PT Education - 11/14/15 0955    Education provided Yes   Education Details HS and piriformis stretches   Person(s) Educated Patient   Methods Explanation;Demonstration;Handout   Comprehension Verbalized understanding          PT Short Term Goals - 11/14/15 1043      PT SHORT TERM GOAL #1   Title  independent with initial HEP   Time 2   Period Weeks   Status New           PT Long Term Goals - 11/14/15 1043      PT LONG TERM GOAL #1   Title increase lumbar ROM by 25%  Time 8   Period Weeks   Status New     PT LONG TERM GOAL #2   Title increase SLR to 80 degrees   Time 8   Period Weeks   Status New     PT LONG TERM GOAL #3   Title Walk 30 minutes without pain > 2/10   Time 8   Period Weeks   Status New     PT LONG TERM GOAL #4   Title Understand proper posture and body mechanics for ADL's   Time 8   Period Weeks   Status New               Plan - 11/14/15 0956    Clinical Impression Statement Patient with a second lumbar decompression surgery on 10/13/15, her firsr was 10/20/14.  She reports feeling pretty good since ht surgery, she does like to do a lot of yardwork and gardening and wants to be strong and safe for this activity in the spring.  She has tightness in the LE's   Rehab Potential Good   PT Frequency 2x / week   PT Duration 8 weeks   PT Treatment/Interventions ADLs/Self Care Home Management;Electrical Stimulation;Moist Heat;Therapeutic activities;Therapeutic exercise;Neuromuscular re-education;Patient/family education;Manual techniques   PT Next Visit Plan slowly add exercises and educate about posture and body mechanics   Consulted and Agree with Plan of Care Patient      Patient will benefit from skilled therapeutic intervention in order to improve the following deficits and impairments:  Cardiopulmonary status limiting activity, Decreased activity tolerance, Decreased range of motion, Decreased strength, Increased muscle spasms, Impaired flexibility, Postural dysfunction, Improper body mechanics, Pain  Visit Diagnosis: Acute midline low back pain without sciatica - Plan: PT plan of care cert/re-cert      G-Codes - 99/35/70 1048    Functional Assessment Tool Used Foto 56% limitation   Functional Limitation Mobility: Walking and moving  around   Mobility: Walking and Moving Around Current Status [phone]) At least 40 percent but less than 60 percent impaired, limited or restricted   Mobility: Walking and Moving Around Goal Status [phone]) At least 40 percent but less than 60 percent impaired, limited or restricted       Problem List Patient Active Problem List   Diagnosis Date Noted  . Spinal stenosis of lumbar region 10/20/2014  . Mild aortic stenosis 12/29/2013  . Dense breasts 12/22/2012  . History of breast cancer 12/22/2012  . Carcinoma of breast treated with adjuvant chemotherapy ([place])   . 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 female breast ([place]) 12/20/2010  . HYPERCHOLESTEROLEMIA 09/14/2008  . DEPRESSION 09/14/2008  . GERD 09/14/2008  . OTITIS MEDIA, SEROUS, ACUTE, RIGHT 05/31/2008  . Type 2 diabetes mellitus ([place]) 05/20/2008  . Elevated lipids 04/17/2007  . Essential hypertension 04/17/2007  . CAD S/P PCI 2007 04/17/2007  . ARTHRALGIA 04/17/2007  . HYPOTHYROIDISM 04/03/2007  . OTHER ABNORMAL BLOOD CHEMISTRY 12/03/2006    Sumner Boast., PT 11/14/2015, 10:50 AM  [address] [place], [place], [address] Phone: [phone]   Fax:  [phone]  Name: ERIS HANNAN MRN: [id number] Date of Birth: 03-12-1940

## 2015-11-17 ENCOUNTER — Telehealth (HOSPITAL_COMMUNITY): Payer: Self-pay | Admitting: *Deleted

## 2015-11-17 NOTE — Telephone Encounter (Signed)
Patient given detailed instructions per Myocardial Perfusion Study Information Sheet for the test on 11/22/15. Patient notified to arrive 15 minutes early and that it is imperative to arrive on time for appointment to keep from having the test rescheduled.  If you need to cancel or reschedule your appointment, please call the office within 24 hours of your appointment. Failure to do so may result in a cancellation of your appointment, and a $50 no show fee. Patient verbalized understanding.  [name]    

## 2015-11-22 ENCOUNTER — Encounter: Payer: Self-pay | Admitting: Physical Therapy

## 2015-11-22 ENCOUNTER — Ambulatory Visit: Payer: Medicare Other | Admitting: Physical Therapy

## 2015-11-22 ENCOUNTER — Ambulatory Visit (HOSPITAL_COMMUNITY): Payer: Medicare Other | Attending: Cardiology

## 2015-11-22 DIAGNOSIS — R011 Cardiac murmur, unspecified: Secondary | ICD-10-CM | POA: Diagnosis not present

## 2015-11-22 DIAGNOSIS — M545 Low back pain, unspecified: Secondary | ICD-10-CM

## 2015-11-22 DIAGNOSIS — I350 Nonrheumatic aortic (valve) stenosis: Secondary | ICD-10-CM | POA: Insufficient documentation

## 2015-11-22 DIAGNOSIS — R262 Difficulty in walking, not elsewhere classified: Secondary | ICD-10-CM

## 2015-11-22 LAB — MYOCARDIAL PERFUSION IMAGING
CHL CUP RESTING HR STRESS: 82 {beats}/min
CSEPEDS: 0 s
CSEPHR: 94 %
Estimated workload: 7 METS
Exercise duration (min): 5 min
LVDIAVOL: 61 mL (ref 46–106)
LVSYSVOL: 25 mL
MPHR: 148 {beats}/min
Peak HR: 139 {beats}/min
RATE: 0.25
SDS: 2
SRS: 3
SSS: 5
TID: 0.85

## 2015-11-22 MED ORDER — TECHNETIUM TC 99M TETROFOSMIN IV KIT
10.1000 | PACK | Freq: Once | INTRAVENOUS | Status: AC | PRN
  Administered 2015-11-22: 10.1 via INTRAVENOUS
  Filled 2015-11-22: qty 11

## 2015-11-22 MED ORDER — TECHNETIUM TC 99M TETROFOSMIN IV KIT
32.0000 | PACK | Freq: Once | INTRAVENOUS | Status: AC | PRN
  Administered 2015-11-22: 32 via INTRAVENOUS
  Filled 2015-11-22: qty 32

## 2015-11-22 NOTE — Therapy (Signed)
[place] [address] [place], [place], [address] Phone: [phone]   Fax:  [phone]  Physical Therapy Treatment  Patient Details  Name: [name] MRN: [id number] Date of Birth: 30-Nov-1940 Referring Provider: Tonita Cong  Encounter Date: 11/22/2015      PT End of Session - 11/22/15 1208    Visit Number 2   Date for PT Re-Evaluation 01/14/16   PT Start Time 9622   PT Stop Time 1208   PT Time Calculation (min) 23 min   Activity Tolerance Patient tolerated treatment well      Past Medical History:  Diagnosis Date  . Anemia    childhood  . Anxiety   . Arterial stenosis (HCC)    mesenteric  . Arthritis   . Breast cancer ([place]) 12/05/10   R breast, inv mammary, in situ,ER/PR +,HER2 -  . Cancer ([place])   . Carcinoma of breast treated with adjuvant chemotherapy ([place])   . Coronary artery disease    stent 2007  . Depression   . Diabetes mellitus   . Difficulty sleeping   . Diverticulosis 12/10/03  . Elevated cholesterol   . GERD (gastroesophageal reflux disease)   . Heart murmur    secondary to chem. 2013  . Hepatitis    as an infant  . HSV-1 (herpes simplex virus 1) infection   . Hyperlipidemia   . Hypertension   . Hypothyroidism   . Osteoarthritis   . Pneumonia    2014 september  . 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 Spinal stenosis   . Thyroid disease     Past Surgical History:  Procedure Laterality Date  . BREAST LUMPECTOMY  1983   benign biopsy  . BREAST LUMPECTOMY Right 12/29/2010   snbx, ER/PR +, Her2 -, 0/1 node pos.  . CAROTID STENT  2007  . CARPAL TUNNEL RELEASE Bilateral 9/12,8,12  . CHOLECYSTECTOMY    . COLONOSCOPY N/A 02/09/2013   Procedure: COLONOSCOPY;  Surgeon: [name], MD;  [facility];  Service: Endoscopy;  Laterality: N/A;  . CORONARY ANGIOPLASTY    . DILATION AND CURETTAGE OF UTERUS    . ESOPHAGOGASTRODUODENOSCOPY N/A 02/09/2013   Procedure: ESOPHAGOGASTRODUODENOSCOPY (EGD);  Surgeon: [name], MD;  Location[name];  Service: Endoscopy;  Laterality: N/A;  . FOOT SPURS    . HYSTEROSCOPY W/D&C N/A 09/11/2012   Procedure: DILATATION AND CURETTAGE ;  Surgeon: [name], MD;  Location: [facility];  Service: Gynecology;  Laterality: N/A;  . KNEE SURGERY  2007  . LUMBAR LAMINECTOMY/DECOMPRESSION MICRODISCECTOMY N/A 10/20/2014   Procedure: MICRO LUMBER DECOMPRESSION L3-4 L4-5;  Surgeon: [name], MD;  [facility];  Service: Orthopedics;  Laterality: N/A;  . LUMBAR LAMINECTOMY/DECOMPRESSION MICRODISCECTOMY Bilateral 10/13/2015   Procedure: MICRO LUMBAR DECOMPRESSION L5 - S1 AND REDO DECOMPRESSION L4 - L5 AND REMOVAL OF FACET CYST L4 - L5 2 LEVELS;  Surgeon: [name], MD;  [facility];  Service: Orthopedics;  Laterality: Bilateral;  . PORT-A-CATH REMOVAL  04/17/2011   Procedure: REMOVAL PORT-A-CATH;  Surgeon: [name], MD;  Location: [facility];  Service: General;  Laterality: Left;  . PORTACATH PLACEMENT  02/07/2011   Procedure: INSERTION PORT-A-CATH;  Surgeon: [name], MD;  [facility];  Service: General;  Laterality: N/A;    There were no vitals filed for this visit.      Subjective Assessment - 11/22/15 1147    Subjective "  Pretty good"   Currently in Pain? No/denies   Pain Score 0-No pain                         [facility] Adult PT Treatment/Exercise - 11/22/15 0001      Lumbar Exercises: Aerobic   Stationary Bike Nustep Level 4 x 6 minutes     Lumbar Exercises: Machines for Strengthening   Cybex Knee Extension 5lb 2x10   Cybex Knee Flexion 25lb 2x10   Other Lumbar Machine Exercise Rows & Lats 20lb 2x10                  PT Short Term Goals - 11/14/15 1043      PT SHORT TERM GOAL #1   Title independent with initial HEP   Time 2   Period Weeks   Status New           PT Long Term Goals - 11/14/15 1043      PT  LONG TERM GOAL #1   Title increase lumbar ROM by 25%   Time 8   Period Weeks   Status New     PT LONG TERM GOAL #2   Title increase SLR to 80 degrees   Time 8   Period Weeks   Status New     PT LONG TERM GOAL #3   Title Walk 30 minutes without pain > 2/10   Time 8   Period Weeks   Status New     PT LONG TERM GOAL #4   Title Understand proper posture and body mechanics for ADL's   Time 8   Period Weeks   Status New               Plan - 11/22/15 1208    Clinical Impression Statement Pt reported that she would like a shortened session because she was just leaving from her stress test. Pt tolerated all interventions well evident by no subjective reports of increase pain.   Rehab Potential Good   PT Frequency 2x / week   PT Duration 8 weeks   PT Treatment/Interventions ADLs/Self Care Home Management;Electrical Stimulation;Moist Heat;Therapeutic activities;Therapeutic exercise;Neuromuscular re-education;Patient/family education;Manual techniques   PT Next Visit Plan slowly add exercises and educate about posture and body mechanics      Patient will benefit from skilled therapeutic intervention in order to improve the following deficits and impairments:  Cardiopulmonary status limiting activity, Decreased activity tolerance, Decreased range of motion, Decreased strength, Increased muscle spasms, Impaired flexibility, Postural dysfunction, Improper body mechanics, Pain  Visit Diagnosis: Acute midline low back pain without sciatica  Difficulty walking     Problem List Patient Active Problem List   Diagnosis Date Noted  . Spinal stenosis of lumbar region 10/20/2014  . Mild aortic stenosis 12/29/2013  . Dense breasts 12/22/2012  . History of breast cancer 12/22/2012  . Carcinoma of breast treated with adjuvant chemotherapy ([place])   . 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 female breast ([place]) 12/20/2010  . HYPERCHOLESTEROLEMIA 09/14/2008  . DEPRESSION  09/14/2008  . GERD 09/14/2008  . OTITIS MEDIA, SEROUS, ACUTE, RIGHT 05/31/2008  . Type 2 diabetes mellitus ([place]) 05/20/2008  . Elevated lipids 04/17/2007  . Essential hypertension 04/17/2007  . CAD S/P PCI 2007 04/17/2007  . ARTHRALGIA 04/17/2007  . HYPOTHYROIDISM 04/03/2007  . OTHER ABNORMAL BLOOD CHEMISTRY 12/03/2006    Scot Jun, PTA 11/22/2015, 12:14 PM  [facility] [phone] [address]  [place], [place], [address] Phone: [phone]   Fax:  [phone]  Name: [name] MRN: [id number] Date of Birth: May 08, 1940

## 2015-11-24 ENCOUNTER — Encounter: Payer: Self-pay | Admitting: Physical Therapy

## 2015-11-24 ENCOUNTER — Ambulatory Visit: Payer: Medicare Other | Attending: Specialist | Admitting: Physical Therapy

## 2015-11-24 DIAGNOSIS — M545 Low back pain, unspecified: Secondary | ICD-10-CM

## 2015-11-24 DIAGNOSIS — R262 Difficulty in walking, not elsewhere classified: Secondary | ICD-10-CM | POA: Diagnosis present

## 2015-11-24 NOTE — Therapy (Addendum)
[place] [address] [place], [place], [address] Phone: [phone]   Fax:  [phone]  Physical Therapy Treatment  Patient Details  Name: BERNECE GALL MRN: [id number] Date of Birth: [birth year] Referring Provider: Tonita Cong  Encounter Date: 11/24/2015      PT End of Session - 11/24/15 1156    Visit Number 3   Date for PT Re-Evaluation 01/14/16   PT Start Time 4128   PT Stop Time 1206   PT Time Calculation (min) 21 min   Activity Tolerance Patient tolerated treatment well   Behavior During Therapy [facility] for tasks assessed/performed      Past Medical History:  Diagnosis Date  . Anemia    childhood  . Anxiety   . Arterial stenosis (HCC)    mesenteric  . Arthritis   . Breast cancer ([place]) 12/05/10   R breast, inv mammary, in situ,ER/PR +,HER2 -  . Cancer ([place])   . Carcinoma of breast treated with adjuvant chemotherapy ([place])   . Coronary artery disease    stent 2007  . Depression   . Diabetes mellitus   . Difficulty sleeping   . Diverticulosis 12/10/03  . Elevated cholesterol   . GERD (gastroesophageal reflux disease)   . Heart murmur    secondary to chem. 2013  . Hepatitis    as an infant  . HSV-1 (herpes simplex virus 1) infection   . Hyperlipidemia   . Hypertension   . Hypothyroidism   . Osteoarthritis   . Pneumonia    2014 september  . 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 Spinal stenosis   . Thyroid disease     Past Surgical History:  Procedure Laterality Date  . BREAST LUMPECTOMY  1983   benign biopsy  . BREAST LUMPECTOMY Right 12/29/2010   snbx, ER/PR +, Her2 -, 0/1 node pos.  . CAROTID STENT  2007  . CARPAL TUNNEL RELEASE Bilateral 9/12,8,12  . CHOLECYSTECTOMY    . COLONOSCOPY N/A 02/09/2013   Procedure: COLONOSCOPY;  Surgeon: [name], MD;  [facility];  Service: Endoscopy;  Laterality: N/A;  . CORONARY ANGIOPLASTY    . DILATION AND CURETTAGE OF  UTERUS    . ESOPHAGOGASTRODUODENOSCOPY N/A 02/09/2013   Procedure: ESOPHAGOGASTRODUODENOSCOPY (EGD);  Surgeon: [name], MD;  [name];  Service: Endoscopy;  Laterality: N/A;  . FOOT SPURS    . HYSTEROSCOPY W/D&C N/A 09/11/2012   Procedure: DILATATION AND CURETTAGE ;  Surgeon: [name], MD;  Location: [facility];  Service: Gynecology;  Laterality: N/A;  . KNEE SURGERY  2007  . LUMBAR LAMINECTOMY/DECOMPRESSION MICRODISCECTOMY N/A 10/20/2014   Procedure: MICRO LUMBER DECOMPRESSION L3-4 L4-5;  Surgeon: [name], MD;  [facility];  Service: Orthopedics;  Laterality: N/A;  . LUMBAR LAMINECTOMY/DECOMPRESSION MICRODISCECTOMY Bilateral 10/13/2015   Procedure: MICRO LUMBAR DECOMPRESSION L5 - S1 AND REDO DECOMPRESSION L4 - L5 AND REMOVAL OF FACET CYST L4 - L5 2 LEVELS;  Surgeon: [name], MD;  [facility];  Service: Orthopedics;  Laterality: Bilateral;  . PORT-A-CATH REMOVAL  04/17/2011   Procedure: REMOVAL PORT-A-CATH;  Surgeon: [name], MD;  Location: [facility];  Service: General;  Laterality: Left;  . PORTACATH PLACEMENT  02/07/2011   Procedure: INSERTION PORT-A-CATH;  Surgeon: [name], MD;  [facility];  Service: General;  Laterality: N/A;    There were no vitals filed for this visit.  Subjective Assessment - 11/24/15 1153    Subjective Pt called pre treatment reporting that she did not know it she should continue therapy due to her soreness from last session. Pt agree to come to therapy for pain relief only. Pt enters clinic reporting soreness in her legs and low back.   Currently in Pain? Yes   Pain Score 8    Pain Location --  Back, legs   Pain Descriptors / Indicators Sore                         [facility] Adult PT Treatment/Exercise - 11/24/15 0001      Modalities   Modalities Electrical Stimulation;Moist Heat     Moist Heat Therapy   Number Minutes Moist Heat 15 Minutes   Moist Heat  Location Lumbar Spine     Electrical Stimulation   Electrical Stimulation Location lumpar spine   Electrical Stimulation Action IFC   Electrical Stimulation Parameters to pt tolerance   Electrical Stimulation Goals Pain                  PT Short Term Goals - 11/14/15 1043      PT SHORT TERM GOAL #1   Title independent with initial HEP   Time 2   Period Weeks   Status New           PT Long Term Goals - 11/24/15 1159      PT LONG TERM GOAL #1   Title increase lumbar ROM by 25%   Status On-going     PT LONG TERM GOAL #2   Title increase SLR to 80 degrees   Status On-going     PT LONG TERM GOAL #3   Title Walk 30 minutes without pain > 2/10   Status On-going     PT LONG TERM GOAL #4   Title Understand proper posture and body mechanics for ADL's   Status On-going               Plan - 11/24/15 1157    Clinical Impression Statement Pt educated on the physiology of delayed onset muscle soreness. Pt agreed to E-Stim and moist heat to her lumbar spine.    Rehab Potential Good   PT Frequency 2x / week   PT Duration 8 weeks   PT Treatment/Interventions ADLs/Self Care Home Management;Electrical Stimulation;Moist Heat;Therapeutic activities;Therapeutic exercise;Neuromuscular re-education;Patient/family education;Manual techniques   PT Next Visit Plan slowly add exercises and educate about posture and body mechanics      Patient will benefit from skilled therapeutic intervention in order to improve the following deficits and impairments:  Cardiopulmonary status limiting activity, Decreased activity tolerance, Decreased range of motion, Decreased strength, Increased muscle spasms, Impaired flexibility, Postural dysfunction, Improper body mechanics, Pain  Visit Diagnosis: Acute midline low back pain without sciatica  Difficulty walking     Problem List Patient Active Problem List   Diagnosis Date Noted  . Spinal stenosis of lumbar region 10/20/2014   . Mild aortic stenosis 12/29/2013  . Dense breasts 12/22/2012  . History of breast cancer 12/22/2012  . Carcinoma of breast treated with adjuvant chemotherapy ([place])   . 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 female breast ([place]) 12/20/2010  . HYPERCHOLESTEROLEMIA 09/14/2008  . DEPRESSION 09/14/2008  . GERD 09/14/2008  . OTITIS MEDIA, SEROUS, ACUTE, RIGHT 05/31/2008  . Type 2 diabetes mellitus ([place]) 05/20/2008  . Elevated lipids 04/17/2007  . Essential hypertension 04/17/2007  . CAD S/P  PCI 2007 04/17/2007  . ARTHRALGIA 04/17/2007  . HYPOTHYROIDISM 04/03/2007  . OTHER ABNORMAL BLOOD CHEMISTRY 12/03/2006    PHYSICAL THERAPY DISCHARGE SUMMARY  Visits from Start of Care: 3 Plan: Patient agrees to discharge.  Patient goals were not met. Patient is being discharged due to not returning since the last visit.  ?????     Scot Jun PTA 11/24/2015, 12:00 PM  [place] [address] [place], [place], [address] Phone: [phone]   Fax:  [phone]  Name: EMMORY SOLIVAN MRN: [id number] Date of Birth: 23-Jun-1940

## 2015-12-21 ENCOUNTER — Ambulatory Visit
Admission: RE | Admit: 2015-12-21 | Discharge: 2015-12-21 | Disposition: A | Discharge status: Home / Self Care | Payer: Medicare Other | Source: Ambulatory Visit | Attending: Obstetrics and Gynecology | Admitting: Obstetrics and Gynecology

## 2015-12-21 DIAGNOSIS — Z853 Personal history of malignant neoplasm of breast: Secondary | ICD-10-CM

## 2016-02-10 ENCOUNTER — Other Ambulatory Visit: Payer: Self-pay | Admitting: Nurse Practitioner

## 2016-02-21 ENCOUNTER — Ambulatory Visit: Payer: Medicare Other | Admitting: Nurse Practitioner

## 2016-02-21 ENCOUNTER — Other Ambulatory Visit: Payer: Medicare Other

## 2016-02-21 ENCOUNTER — Encounter: Payer: Self-pay | Admitting: Oncology

## 2016-02-21 ENCOUNTER — Other Ambulatory Visit (HOSPITAL_BASED_OUTPATIENT_CLINIC_OR_DEPARTMENT_OTHER): Payer: Medicare Other

## 2016-02-21 ENCOUNTER — Ambulatory Visit (HOSPITAL_BASED_OUTPATIENT_CLINIC_OR_DEPARTMENT_OTHER): Payer: Medicare Other | Admitting: Oncology

## 2016-02-21 VITALS — BP 184/86 | HR 98 | Temp 97.8°F | Resp 18 | Ht 63.0 in

## 2016-02-21 DIAGNOSIS — C50511 Malignant neoplasm of lower-outer quadrant of right female breast: Secondary | ICD-10-CM

## 2016-02-21 DIAGNOSIS — Z170 Estrogen receptor positive status [ER+]: Secondary | ICD-10-CM

## 2016-02-21 DIAGNOSIS — Z79811 Long term (current) use of aromatase inhibitors: Secondary | ICD-10-CM

## 2016-02-21 LAB — COMPREHENSIVE METABOLIC PANEL
ALK PHOS: 62 U/L (ref 40–150)
ALT: 19 U/L (ref 0–55)
AST: 22 U/L (ref 5–34)
Albumin: 3.4 g/dL — ABNORMAL LOW (ref 3.5–5.0)
Anion Gap: 10 mEq/L (ref 3–11)
BILIRUBIN TOTAL: 0.41 mg/dL (ref 0.20–1.20)
BUN: 18.7 mg/dL (ref 7.0–26.0)
CO2: 24 mEq/L (ref 22–29)
CREATININE: 1.4 mg/dL — AB (ref 0.6–1.1)
Calcium: 9 mg/dL (ref 8.4–10.4)
Chloride: 104 mEq/L (ref 98–109)
EGFR: 38 mL/min/{1.73_m2} — ABNORMAL LOW (ref >90)
GLUCOSE: 194 mg/dL — AB (ref 70–140)
Potassium: 4.5 mEq/L (ref 3.5–5.1)
SODIUM: 138 meq/L (ref 136–145)
TOTAL PROTEIN: 6.7 g/dL (ref 6.4–8.3)

## 2016-02-21 LAB — CBC WITH DIFFERENTIAL/PLATELET
BASO%: 1.2 % (ref 0.0–2.0)
Basophils Absolute: 0.1 10*3/uL (ref 0.0–0.1)
EOS ABS: 0.4 10*3/uL (ref 0.0–0.5)
EOS%: 6 % (ref 0.0–7.0)
HCT: 36.4 % (ref 34.8–46.6)
HEMOGLOBIN: 12 g/dL (ref 11.6–15.9)
LYMPH#: 2.6 10*3/uL (ref 0.9–3.3)
LYMPH%: 39.5 % (ref 14.0–49.7)
MCH: 28.9 pg (ref 25.1–34.0)
MCHC: 33.1 g/dL (ref 31.5–36.0)
MCV: 87.4 fL (ref 79.5–101.0)
MONO#: 0.7 10*3/uL (ref 0.1–0.9)
MONO%: 10.2 % (ref 0.0–14.0)
NEUT%: 43.1 % (ref 38.4–76.8)
NEUTROS ABS: 2.9 10*3/uL (ref 1.5–6.5)
Platelets: 257 10*3/uL (ref 145–400)
RBC: 4.17 10*6/uL (ref 3.70–5.45)
RDW: 14.2 % (ref 11.2–14.5)
WBC: 6.6 10*3/uL (ref 3.9–10.3)

## 2016-02-21 NOTE — Progress Notes (Signed)
ID: [name]   DOB: 27-Jul-1940  MR#: [id number]  CSN#:[id number]  PCP:  [name], MD GYN:  [name], MD SUR:  [name], MD [place]:  [name], MD OTHER:  [name], MD;  [name], MD  CHIEF COMPLAINT:  Right Breast Cancer  CURRENT TREATMENT: Completed 5 years of anti-estrogen therapy  BREAST CANCER HISTORY: From the original intake note:  The patient had a screening mammogram July of 2009, which was unremarkable. Screening mammography 2012 showed a possible mass in the right breast. Right diagnostic mammogram 12/05/2010 confirmed an irregularly marginated oval mass in the right breast at the 6:00 position measuring up to 1.4 cm. On physical exam Dr. [name] was not able to palpate a discrete abnormality, but ultrasound showed an irregularly marginated hypoechoic mass measuring 1.1 cm, and containing microcalcifications.  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 Estrogen receptors were expressed with 100% positivity, same for progesterone receptors, but there was no HER-2 amplification, with the ratio by CISH being 0.96. The MIB-1 was 96%.  With this information the patient underwent bilateral breast MRIs on 12/12/2010, showing a solitary 1.4 cm irregular enhancing mass in the right breast, but no other suspicious masses and no associated adenopathy.  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 Margins were negative. Repeat HER-2 was again negative.   Her subsequent history is as detailed below.  INTERVAL HISTORY:  Dawn returns today for follow-up of her estrogen receptor positive breast cancer. He continues on tamoxifen, with excellent tolerance. She does not have significant issues with hot flashes or vaginal wetness. She has  obtained the drug at a good price.  She has completed now 5 years of anti-estrogens and is ready to "graduate" from follow-up  REVIEW OF SYSTEMS:  Amie had back surgery under Dr. [name] last year. She feels that helped her spinal stenosis. She is not exercising as much as she would like because but she likes to do is walk and is been too cold. She denies unusual headaches visual changes cough phlegm production pleurisy or shortness of breath. There has been no change in bowel or bladder habits. She does not have pain in either breast and understands that if she does have pain in the surgical breast that does not indicate breast cancer recurrence but change in the postoperative scar. A detailed review of systems today was otherwise stable  PAST MEDICAL HISTORY: Past Medical History:  Diagnosis Date  . Anemia    childhood  . Anxiety   . Arterial stenosis (HCC)    mesenteric  . Arthritis   . Breast cancer ([place]) 12/05/10   R breast, inv mammary, in situ,ER/PR +,HER2 -  . Cancer ([place])   . Carcinoma of breast treated with adjuvant chemotherapy ([place])   . Coronary artery disease    stent 2007  . Depression   . Diabetes mellitus   . Difficulty sleeping   . Diverticulosis 12/10/03  . Elevated cholesterol   . GERD (gastroesophageal reflux disease)   . Heart murmur    secondary to chem. 2013  . Hepatitis    as an infant  . HSV-1 (herpes simplex virus 1) infection   . Hyperlipidemia   . Hypertension   . Hypothyroidism   . Osteoarthritis   . Pneumonia    2014 september  .  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 Spinal stenosis   . Thyroid disease    PAST SURGICAL HISTORY: Past Surgical History:  Procedure Laterality Date  . BREAST LUMPECTOMY  1983   benign biopsy  . BREAST LUMPECTOMY Right 12/29/2010   snbx, ER/PR +, Her2 -, 0/1 node pos.  . CAROTID STENT  2007  . CARPAL TUNNEL RELEASE Bilateral 9/12,8,12  . CHOLECYSTECTOMY    . COLONOSCOPY N/A 02/09/2013    Procedure: COLONOSCOPY;  Surgeon: [name], MD;  Location: [facility];  Service: Endoscopy;  Laterality: N/A;  . CORONARY ANGIOPLASTY    . DILATION AND CURETTAGE OF UTERUS    . ESOPHAGOGASTRODUODENOSCOPY N/A 02/09/2013   Procedure: ESOPHAGOGASTRODUODENOSCOPY (EGD);  Surgeon: [name], MD;  Location: [name];  Service: Endoscopy;  Laterality: N/A;  . FOOT SPURS    . HYSTEROSCOPY W/D&C N/A 09/11/2012   Procedure: DILATATION AND CURETTAGE ;  Surgeon: [name], MD;  Location: [facility];  Service: Gynecology;  Laterality: N/A;  . KNEE SURGERY  2007  . LUMBAR LAMINECTOMY/DECOMPRESSION MICRODISCECTOMY N/A 10/20/2014   Procedure: MICRO LUMBER DECOMPRESSION L3-4 L4-5;  Surgeon: [name], MD;  [facility]: [facility];  Service: Orthopedics;  Laterality: N/A;  . LUMBAR LAMINECTOMY/DECOMPRESSION MICRODISCECTOMY Bilateral 10/13/2015   Procedure: MICRO LUMBAR DECOMPRESSION L5 - S1 AND REDO DECOMPRESSION L4 - L5 AND REMOVAL OF FACET CYST L4 - L5 2 LEVELS;  Surgeon: [name], MD;  [facility]: [facility];  Service: Orthopedics;  Laterality: Bilateral;  . PORT-A-CATH REMOVAL  04/17/2011   Procedure: REMOVAL PORT-A-CATH;  Surgeon: [name], MD;  Location: [facility];  Service: General;  Laterality: Left;  . PORTACATH PLACEMENT  02/07/2011   Procedure: INSERTION PORT-A-CATH;  Surgeon: [name], MD;  [facility]: [facility];  Service: General;  Laterality: N/A;    FAMILY HISTORY Family History  Problem Relation Age of Onset  . Throat cancer Father   . Alcoholism Father   . Heart attack Father   . Heart disease Mother   . Lymphoma Brother 39  . Cancer Son   . Heart attack Brother   . Diabetes Brother   . Hypertension Brother   . Colon cancer Neg Hx   . Stroke Neg Hx   The patient's father died at the age of 68 from cancer "in his temples". The patient's mother died from renal failure at the age of 36. The patient had 3 brothers, one of whom died at the age of 50 from  non-Hodgkin's lymphoma. She has one sister. There is no history of breast or ovarian cancer in the family to her knowledge.    GYNECOLOGIC HISTORY: GX P3. Menarche age 62, last menstrual period in her mid 49s.She used hormone replacement for about 10 years, discontinuing that in 2002.   SOCIAL HISTORY: (Updated 12/22/2012) She is a retired Radiation protection practitioner. Her husband [name] is a retired Quarry manager. One daughter died from suicide at the age of 70 in the setting of postpartum depression. A second daughter, [name], works in [place] as a Art therapist. Son Adean Milosevic 36 is a Quarry manager in [place]. The patient has 7 grandchildren. She attends a [employer].    ADVANCED DIRECTIVES: in place  HEALTH MAINTENANCE:  (Updated 12/22/2012) Social History  Substance Use Topics  . Smoking status: Former Smoker    Packs/day: 2.00    Years: 40.00    Quit date: 12/19/1997  . Smokeless tobacco: Never Used  . Alcohol use  3.6 oz/week    6 Glasses of wine per week     Comment: occasional    Cholesterol -- under treatment/[name]  Bone density:  Nov 2012 at [facility], "normal"  Colonoscopy:  UTD/[facility] PAP:  November 2014, [facility]    Allergies  Allergen Reactions  . Codeine Nausea And Vomiting and Other (See Comments)    Severe stomach cramps  . Antihistamines, Diphenhydramine-Type Other (See Comments)    Causes hyperactivity  . Statins Other (See Comments)    Joint pains  . Erythromycin Hives and Rash    Due to dental work about 50 years ago. Was used in packing and resulted in rash/hives inside and outside of mouth.    Current Outpatient Prescriptions  Medication Sig Dispense Refill  . aspirin 81 MG tablet Take 1 tablet (81 mg total) by mouth daily. Resume 4 days post-op    . colesevelam (WELCHOL) 625 MG tablet Take 2,500 mg by mouth daily.    Marland Kitchen docusate sodium (COLACE) 100 MG capsule Take 1 capsule (100 mg total) by mouth 2 (two) times daily. 30 capsule 1  .  glipiZIDE (GLUCOTROL XL) 2.5 MG 24 hr tablet Take 2.5 mg by mouth daily with breakfast.    . ibuprofen (ADVIL,MOTRIN) 200 MG tablet Take 1 tablet (200 mg total) by mouth every 6 (six) hours as needed for mild pain or moderate pain. Resume 5 days post-op as needed  0  . levothyroxine (SYNTHROID, LEVOTHROID) 50 MCG tablet Take 50 mcg by mouth daily.    Marland Kitchen losartan (COZAAR) 100 MG tablet Take 50 mg by mouth daily.     . metFORMIN (GLUCOPHAGE) 1000 MG tablet Take 500 mg by mouth 2 (two) times daily.    . nitroGLYCERIN (NITROSTAT) 0.4 MG SL tablet Place 1 tablet (0.4 mg total) under the tongue every 5 (five) minutes as needed for chest pain. 25 tablet 3  . pantoprazole (PROTONIX) 40 MG tablet Take 40 mg by mouth daily.    Marland Kitchen PARoxetine (PAXIL) 20 MG tablet Take 20 mg by mouth every morning.    . polyethylene glycol (MIRALAX) packet Take 17 g by mouth daily. 14 each 0  . sitaGLIPtin (JANUVIA) 100 MG tablet Take 100 mg by mouth daily.    . tamoxifen (NOLVADEX) 20 MG tablet Take 1 tablet by mouth  daily 90 tablet 3  . valACYclovir (VALTREX) 1000 MG tablet Take 1,000 mg by mouth 2 (two) times daily as needed (for viral infection).     No current facility-administered medications for this visit.     OBJECTIVE: Middle-aged white woman who appears stated age  31:   02/21/16 1045  BP: (!) 184/86  Pulse: 98  Resp: 18  Temp: 97.8 F (36.6 C)       ECOG FS: 1   Sclerae unicteric, EOMs intact Oropharynx clear and moist No cervical or supraclavicular adenopathy Lungs no rales or rhonchi Heart regular rate and rhythm Abd soft, nontender, positive bowel sounds MSK no focal spinal tenderness, no upper extremity lymphedema Neuro: nonfocal, well oriented, appropriate affect Breasts: The right breast is status post lumpectomy and radiation, with no evidence of local recurrence. The right axilla is benign. The left breast is unremarkable    LAB RESULTS: Lab Results  Component Value Date   WBC 6.6  02/21/2016   NEUTROABS 2.9 02/21/2016   HGB 12.0 02/21/2016   HCT 36.4 02/21/2016   MCV 87.4 02/21/2016   PLT 257 02/21/2016      Chemistry  Component Value Date/Time   NA 139 10/14/2015 0405   NA 139 02/21/2015 1221   K 4.5 10/14/2015 0405   K 4.4 02/21/2015 1221   CL 109 10/14/2015 0405   CL 106 04/10/2012 0921   CO2 24 10/14/2015 0405   CO2 21 (L) 02/21/2015 1221   BUN 21 (H) 10/14/2015 0405   BUN 19.1 02/21/2015 1221   CREATININE 1.34 (H) 10/14/2015 0405   CREATININE 1.4 (H) 02/21/2015 1221      Component Value Date/Time   CALCIUM 8.4 (L) 10/14/2015 0405   CALCIUM 8.8 02/21/2015 1221   ALKPHOS 60 09/10/2015 1030   ALKPHOS 73 02/21/2015 1221   AST 26 09/10/2015 1030   AST 14 02/21/2015 1221   ALT 22 09/10/2015 1030   ALT 16 02/21/2015 1221   BILITOT 0.5 09/10/2015 1030   BILITOT 0.35 02/21/2015 1221        STUDIES: Narrative   CLINICAL DATA: History of malignant lumpectomy of the right breast with chemotherapy and radiation therapy. The patient is on antiestrogen therapy at this time. Follow-up evaluation.  EXAM: 2D DIGITAL DIAGNOSTIC BILATERAL MAMMOGRAM WITH CAD AND ADJUNCT TOMO  COMPARISON: Previous exam(s).  ACR Breast Density Category c: The breast tissue is heterogeneously dense, which may obscure small masses.  FINDINGS: The breast parenchymal pattern is stable. There is no specific evidence for recurrent tumor or developing malignancy within either breast. There are mild scarring changes within the right breast related to the patient's lumpectomy.  Mammographic images were processed with CAD.  IMPRESSION: No findings worrisome for recurrent tumor or developing malignancy.  RECOMMENDATION: Bilateral diagnostic mammography 1 year.  I have discussed the findings and recommendations with the patient. Results were also provided in writing at the conclusion of the visit. If applicable, a reminder letter will be sent to the  patient regarding the next appointment.  BI-RADS CATEGORY 1: Negative.   Electronically Signed By: Altamese Cabal M.D. On: 12/21/2015 11     ASSESSMENT:75 y.o.  [place] woman   (1) status post right lumpectomy and sentinel lymph node biopsy November of 2012 for a pT1c pN0, Stage IA invasive ductal carcinoma, grade 3, strongly estrogen and progesterone receptor positive, HER-2 not amplified, with an MIB-1 of 96%. Her Oncotype recurrence score predicted a 14% risk of distant recurrence if her only systemic adjuvant therapy is tamoxifen for five years.   (2) status post two cycles of adjuvant docetaxel/cyclophosphamide, completed February 2013, the final two cycles omitted because of poor tolerance  (3) status post radiation completed April 2013  (4)  on anastrozole briefly in May 2013, discontinued secondary to side effects  (5)  on letrozole briefly in early August 2013, discontinued secondary to side effects which included nausea and headaches  (6)  on exemestane between September and November 2013, discontinued doe to arthralgias  (7)  started on tamoxifen beginning in December 2013, discontinued February 2018  PLAN:  Lezlie is now a little over 5 years out from definitive surgery for her breast cancer, with no evidence of disease recurrence. This is very favorable area  She has completed a little over 4-1/2 years out on anti-estrogens and may stop when she runs out of her current tamoxifen, in the next couple of months. She is ready to "graduate" from follow-up here. I would calculate her risk of recurrence for this cancer isn't of 5% or less range.  She understands all she will need in terms of breast cancer screening is yearly mammography, and a yearly physician breast  exam.  I will be glad to see her again at any point in the future if on when the need arises but as of now we are making no further routine appointment for her    Chauncey Cruel, [place] [phone] 02/21/2016

## 2016-03-15 NOTE — H&P (Addendum)
[name] is an 76 y.o. female presents for surgical mngt of pelvic organ prolapse  Menstrual History: No LMP recorded. Patient is postmenopausal.    Past Medical History:  Diagnosis Date  . Anemia    childhood  . Anxiety   . Arterial stenosis (HCC)    mesenteric  . Arthritis   . Breast cancer ([place]) 12/05/10   R breast, inv mammary, in situ,ER/PR +,HER2 -  . Cancer ([place])   . Carcinoma of breast treated with adjuvant chemotherapy ([place])   . Coronary artery disease    stent 2007  . Depression   . Diabetes mellitus   . Difficulty sleeping   . Diverticulosis 12/10/03  . Elevated cholesterol   . GERD (gastroesophageal reflux disease)   . Heart murmur    secondary to chem. 2013  . Hepatitis    as an infant  . HSV-1 (herpes simplex virus 1) infection   . Hyperlipidemia   . Hypertension   . Hypothyroidism   . Osteoarthritis   . Pneumonia    2014 september  . 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 Spinal stenosis   . Thyroid disease     Past Surgical History:  Procedure Laterality Date  . BREAST LUMPECTOMY  1983   benign biopsy  . BREAST LUMPECTOMY Right 12/29/2010   snbx, ER/PR +, Her2 -, 0/1 node pos.  . CAROTID STENT  2007  . CARPAL TUNNEL RELEASE Bilateral 9/12,8,12  . CHOLECYSTECTOMY    . COLONOSCOPY N/A 02/09/2013   Procedure: COLONOSCOPY;  Surgeon: [name], MD;  [facility];  Service: Endoscopy;  Laterality: N/A;  . CORONARY ANGIOPLASTY    . DILATION AND CURETTAGE OF UTERUS    . ESOPHAGOGASTRODUODENOSCOPY N/A 02/09/2013   Procedure: ESOPHAGOGASTRODUODENOSCOPY (EGD);  Surgeon: [name], MD;  Location: [name];  Service: Endoscopy;  Laterality: N/A;  . FOOT SPURS    . HYSTEROSCOPY W/D&C N/A 09/11/2012   Procedure: DILATATION AND CURETTAGE ;  Surgeon: [name], MD;  Location: [facility];  Service: Gynecology;  Laterality: N/A;  . KNEE SURGERY  2007  . LUMBAR LAMINECTOMY/DECOMPRESSION MICRODISCECTOMY N/A 10/20/2014    Procedure: MICRO LUMBER DECOMPRESSION L3-4 L4-5;  Surgeon: [name], MD;  [facility];  Service: Orthopedics;  Laterality: N/A;  . LUMBAR LAMINECTOMY/DECOMPRESSION MICRODISCECTOMY Bilateral 10/13/2015   Procedure: MICRO LUMBAR DECOMPRESSION L5 - S1 AND REDO DECOMPRESSION L4 - L5 AND REMOVAL OF FACET CYST L4 - L5 2 LEVELS;  Surgeon: [name], MD;  Location: [facility];  Service: Orthopedics;  Laterality: Bilateral;  . PORT-A-CATH REMOVAL  04/17/2011   Procedure: REMOVAL PORT-A-CATH;  Surgeon: [name], MD;  Location: [facility];  Service: General;  Laterality: Left;  . PORTACATH PLACEMENT  02/07/2011   Procedure: INSERTION PORT-A-CATH;  Surgeon: [name], MD;  [facility];  Service: General;  Laterality: N/A;    Family History  Problem Relation Age of Onset  . Throat cancer Father   . Alcoholism Father   . Heart attack Father   . Heart disease Mother   . Lymphoma Brother 68  . Cancer Son   . Heart attack Brother   . Diabetes Brother   . Hypertension Brother   . Colon cancer Neg Hx   . Stroke Neg Hx     Social History:  reports that she quit smoking about 18 years ago. She has a 80.00 pack-year smoking history. She has never used smokeless tobacco. She reports  that she drinks about 3.6 oz of alcohol per week . She reports that she does not use drugs.  Allergies:  Allergies  Allergen Reactions  . Codeine Nausea And Vomiting and Other (See Comments)    Severe stomach cramps  . Antihistamines, Diphenhydramine-Type Other (See Comments)    Causes hyperactivity  . Statins Other (See Comments)    Joint pains  . Erythromycin Hives and Rash    Due to dental work about 50 years ago. Was used in packing and resulted in rash/hives inside and outside of mouth.   Meds:  Losartan, tamoxifen, paxil, synthroid, protonix, welchol, ASA, glipizide, januvia  ROS Physical Exam  AF,, VSS Gen - NAD Abd - soft, NT EXt - NT PV - grade 3 prolapse, no  ulcerations or lacerations  Assessment/Plan: Prolapse LAVH/poss BSO, A&P rpt r/b/a discussed, questions answered, informed consent  Abdulhamid Olgin 03/15/2016, 2:09 PM

## 2016-03-23 NOTE — Patient Instructions (Addendum)
Your procedure is scheduled on:  Thursday, April 05, 2016  Enter through the Main Entrance of [facility] at:  6:00 AM  Pick up the phone at the desk and dial [phone].  Call this number if you have problems the morning of surgery: [phone].  Remember: Do NOT eat food or drink after:  Midnight Wednesday, April 04, 2016  Take these medicines the morning of surgery with a SIP OF WATER:  Levothyroxine, Losartan, Pantoprazole, Paroxetine  Do NOT take evening dose of metformin the day before surgery  Stop ALL herbal medications at this time  Do NOT smoke the day of surgery.  Do NOT wear jewelry (body piercing), metal hair clips/bobby pins, make-up, or nail polish. Do NOT wear lotions, powders, or perfumes.  You may wear deodorant. Do NOT shave for 48 hours prior to surgery. Do NOT bring valuables to the hospital. Contacts, dentures, or bridgework may not be worn into surgery.  Leave suitcase in car.  After surgery it may be brought to your room.  For patients admitted to the hospital, checkout time is 11:00 AM the day of discharge.  Bring a copy of your healthcare power of attorney and living will documents.  **Effective Friday, Jan. 12, 2018, [facility] will implement no hospital visitations from children age 72 and younger due to a steady increase in flu activity in our community and hospitals. **

## 2016-03-26 ENCOUNTER — Telehealth: Payer: Self-pay | Admitting: Cardiovascular Disease

## 2016-03-26 ENCOUNTER — Encounter (HOSPITAL_COMMUNITY)
Admission: RE | Admit: 2016-03-26 | Discharge: 2016-03-26 | Disposition: A | Discharge status: Home / Self Care | Payer: Medicare Other | Source: Ambulatory Visit | Attending: Obstetrics and Gynecology | Admitting: Obstetrics and Gynecology

## 2016-03-26 ENCOUNTER — Encounter (HOSPITAL_COMMUNITY): Payer: Self-pay

## 2016-03-26 DIAGNOSIS — Z01812 Encounter for preprocedural laboratory examination: Secondary | ICD-10-CM | POA: Insufficient documentation

## 2016-03-26 HISTORY — DX: Weakness: R53.1

## 2016-03-26 LAB — TYPE AND SCREEN
ABO/RH(D): O NEG
ANTIBODY SCREEN: NEGATIVE

## 2016-03-26 LAB — COMPREHENSIVE METABOLIC PANEL
ALT: 21 U/L (ref 14–54)
AST: 29 U/L (ref 15–41)
Albumin: 3.7 g/dL (ref 3.5–5.0)
Alkaline Phosphatase: 81 U/L (ref 38–126)
Anion gap: 13 (ref 5–15)
BUN: 26 mg/dL — AB (ref 6–20)
CHLORIDE: 101 mmol/L (ref 101–111)
CO2: 21 mmol/L — ABNORMAL LOW (ref 22–32)
Calcium: 9.1 mg/dL (ref 8.9–10.3)
Creatinine, Ser: 1.38 mg/dL — ABNORMAL HIGH (ref 0.44–1.00)
GFR, EST AFRICAN AMERICAN: 43 mL/min — AB (ref >60)
GFR, EST NON AFRICAN AMERICAN: 37 mL/min — AB (ref >60)
Glucose, Bld: 240 mg/dL — ABNORMAL HIGH (ref 65–99)
POTASSIUM: 4.2 mmol/L (ref 3.5–5.1)
Sodium: 135 mmol/L (ref 135–145)
TOTAL PROTEIN: 7.2 g/dL (ref 6.5–8.1)
Total Bilirubin: 0.2 mg/dL — ABNORMAL LOW (ref 0.3–1.2)

## 2016-03-26 LAB — CBC
HEMATOCRIT: 36.9 % (ref 36.0–46.0)
Hemoglobin: 12 g/dL (ref 12.0–15.0)
MCH: 28.6 pg (ref 26.0–34.0)
MCHC: 32.5 g/dL (ref 30.0–36.0)
MCV: 88.1 fL (ref 78.0–100.0)
PLATELETS: 243 10*3/uL (ref 150–400)
RBC: 4.19 MIL/uL (ref 3.87–5.11)
RDW: 14.2 % (ref 11.5–15.5)
WBC: 7.7 10*3/uL (ref 4.0–10.5)

## 2016-03-26 LAB — ABO/RH: ABO/RH(D): O NEG

## 2016-03-26 NOTE — Telephone Encounter (Signed)
Will forward to Dr.Nishan. 

## 2016-03-26 NOTE — Telephone Encounter (Signed)
Will fax to number provided.  

## 2016-03-26 NOTE — Telephone Encounter (Signed)
[place] for surgery had normal myovue last year

## 2016-03-26 NOTE — Telephone Encounter (Signed)
New message       Request for surgical clearance:  What type of surgery is being performed?  Hysterectomy and anterior and posterior repair with possible bilateral salpingoophorectomy When is this surgery scheduled?  04-05-16 1. Are there any medications that need to be held prior to surgery and how long? Cardiac clearance 2. Name of physician performing surgery? Dr Julien Girt  What is your office phone and fax number?  Fax [phone] or put in epic

## 2016-03-26 NOTE — Pre-Procedure Instructions (Signed)
Dr. Royce Macadamia made aware on elevated glucose, no new orders received at this time.

## 2016-04-05 ENCOUNTER — Ambulatory Visit (HOSPITAL_COMMUNITY): Payer: Medicare Other | Admitting: Anesthesiology

## 2016-04-05 ENCOUNTER — Encounter (HOSPITAL_COMMUNITY)
Admission: RE | Disposition: A | Discharge status: Home / Self Care | Payer: Self-pay | Source: Ambulatory Visit | Attending: Obstetrics and Gynecology

## 2016-04-05 ENCOUNTER — Encounter (HOSPITAL_COMMUNITY): Payer: Self-pay

## 2016-04-05 ENCOUNTER — Observation Stay (HOSPITAL_COMMUNITY)
Admission: RE | Admit: 2016-04-05 | Discharge: 2016-04-06 | Disposition: A | Discharge status: Home / Self Care | Payer: Medicare Other | Source: Ambulatory Visit | Attending: Obstetrics and Gynecology | Admitting: Obstetrics and Gynecology

## 2016-04-05 DIAGNOSIS — E039 Hypothyroidism, unspecified: Secondary | ICD-10-CM | POA: Diagnosis not present

## 2016-04-05 DIAGNOSIS — Z853 Personal history of malignant neoplasm of breast: Secondary | ICD-10-CM | POA: Insufficient documentation

## 2016-04-05 DIAGNOSIS — Z833 Family history of diabetes mellitus: Secondary | ICD-10-CM | POA: Insufficient documentation

## 2016-04-05 DIAGNOSIS — Z79810 Long term (current) use of selective estrogen receptor modulators (SERMs): Secondary | ICD-10-CM | POA: Diagnosis not present

## 2016-04-05 DIAGNOSIS — N814 Uterovaginal prolapse, unspecified: Principal | ICD-10-CM | POA: Insufficient documentation

## 2016-04-05 DIAGNOSIS — E7800 Pure hypercholesterolemia, unspecified: Secondary | ICD-10-CM | POA: Insufficient documentation

## 2016-04-05 DIAGNOSIS — M1990 Unspecified osteoarthritis, unspecified site: Secondary | ICD-10-CM | POA: Insufficient documentation

## 2016-04-05 DIAGNOSIS — Z9889 Other specified postprocedural states: Secondary | ICD-10-CM | POA: Diagnosis not present

## 2016-04-05 DIAGNOSIS — I10 Essential (primary) hypertension: Secondary | ICD-10-CM | POA: Insufficient documentation

## 2016-04-05 DIAGNOSIS — Z7982 Long term (current) use of aspirin: Secondary | ICD-10-CM | POA: Insufficient documentation

## 2016-04-05 DIAGNOSIS — N819 Female genital prolapse, unspecified: Secondary | ICD-10-CM | POA: Diagnosis present

## 2016-04-05 DIAGNOSIS — M4800 Spinal stenosis, site unspecified: Secondary | ICD-10-CM | POA: Insufficient documentation

## 2016-04-05 DIAGNOSIS — Z9221 Personal history of antineoplastic chemotherapy: Secondary | ICD-10-CM | POA: Insufficient documentation

## 2016-04-05 DIAGNOSIS — N8189 Other female genital prolapse: Secondary | ICD-10-CM | POA: Diagnosis present

## 2016-04-05 DIAGNOSIS — K219 Gastro-esophageal reflux disease without esophagitis: Secondary | ICD-10-CM | POA: Insufficient documentation

## 2016-04-05 DIAGNOSIS — N83201 Unspecified ovarian cyst, right side: Secondary | ICD-10-CM | POA: Insufficient documentation

## 2016-04-05 DIAGNOSIS — E785 Hyperlipidemia, unspecified: Secondary | ICD-10-CM | POA: Diagnosis not present

## 2016-04-05 DIAGNOSIS — Z8249 Family history of ischemic heart disease and other diseases of the circulatory system: Secondary | ICD-10-CM | POA: Diagnosis not present

## 2016-04-05 DIAGNOSIS — Z807 Family history of other malignant neoplasms of lymphoid, hematopoietic and related tissues: Secondary | ICD-10-CM | POA: Insufficient documentation

## 2016-04-05 DIAGNOSIS — Z86010 Personal history of colonic polyps: Secondary | ICD-10-CM | POA: Diagnosis not present

## 2016-04-05 DIAGNOSIS — Z811 Family history of alcohol abuse and dependence: Secondary | ICD-10-CM | POA: Diagnosis not present

## 2016-04-05 DIAGNOSIS — N83202 Unspecified ovarian cyst, left side: Secondary | ICD-10-CM | POA: Diagnosis not present

## 2016-04-05 DIAGNOSIS — Z885 Allergy status to narcotic agent status: Secondary | ICD-10-CM | POA: Insufficient documentation

## 2016-04-05 DIAGNOSIS — Z800 Family history of malignant neoplasm of digestive organs: Secondary | ICD-10-CM | POA: Diagnosis not present

## 2016-04-05 DIAGNOSIS — N72 Inflammatory disease of cervix uteri: Secondary | ICD-10-CM | POA: Diagnosis not present

## 2016-04-05 DIAGNOSIS — Z79899 Other long term (current) drug therapy: Secondary | ICD-10-CM | POA: Diagnosis not present

## 2016-04-05 DIAGNOSIS — E119 Type 2 diabetes mellitus without complications: Secondary | ICD-10-CM | POA: Insufficient documentation

## 2016-04-05 DIAGNOSIS — Z888 Allergy status to other drugs, medicaments and biological substances status: Secondary | ICD-10-CM | POA: Insufficient documentation

## 2016-04-05 DIAGNOSIS — Z9049 Acquired absence of other specified parts of digestive tract: Secondary | ICD-10-CM | POA: Diagnosis not present

## 2016-04-05 DIAGNOSIS — Z87891 Personal history of nicotine dependence: Secondary | ICD-10-CM | POA: Insufficient documentation

## 2016-04-05 DIAGNOSIS — Z881 Allergy status to other antibiotic agents status: Secondary | ICD-10-CM | POA: Insufficient documentation

## 2016-04-05 HISTORY — PX: ANTERIOR AND POSTERIOR REPAIR: SHX5121

## 2016-04-05 HISTORY — PX: LAPAROSCOPIC ASSISTED VAGINAL HYSTERECTOMY: SHX5398

## 2016-04-05 LAB — GLUCOSE, CAPILLARY
GLUCOSE-CAPILLARY: 262 mg/dL — AB (ref 65–99)
GLUCOSE-CAPILLARY: 275 mg/dL — AB (ref 65–99)
Glucose-Capillary: 187 mg/dL — ABNORMAL HIGH (ref 65–99)

## 2016-04-05 SURGERY — HYSTERECTOMY, VAGINAL, LAPAROSCOPY-ASSISTED
Anesthesia: General | Site: Vagina

## 2016-04-05 MED ORDER — PROPOFOL 10 MG/ML IV BOLUS
INTRAVENOUS | Status: DC | PRN
  Administered 2016-04-05: 30 mg via INTRAVENOUS
  Administered 2016-04-05: 70 mg via INTRAVENOUS
  Administered 2016-04-05: 100 mg via INTRAVENOUS

## 2016-04-05 MED ORDER — HYDROMORPHONE 1 MG/ML IV SOLN
INTRAVENOUS | Status: DC
  Administered 2016-04-05: 14:00:00 via INTRAVENOUS
  Administered 2016-04-05: 1.2 mg via INTRAVENOUS
  Filled 2016-04-05: qty 25

## 2016-04-05 MED ORDER — EPHEDRINE 5 MG/ML INJ
INTRAVENOUS | Status: AC
  Filled 2016-04-05: qty 10

## 2016-04-05 MED ORDER — OXYCODONE-ACETAMINOPHEN 5-325 MG PO TABS
1.0000 | ORAL_TABLET | ORAL | Status: DC | PRN
  Administered 2016-04-05 – 2016-04-06 (×3): 1 via ORAL
  Filled 2016-04-05 (×3): qty 1

## 2016-04-05 MED ORDER — NITROGLYCERIN 0.4 MG SL SUBL
0.4000 mg | SUBLINGUAL_TABLET | SUBLINGUAL | Status: DC | PRN
  Filled 2016-04-05: qty 25

## 2016-04-05 MED ORDER — BUPIVACAINE HCL (PF) 0.25 % IJ SOLN
INTRAMUSCULAR | Status: AC
  Filled 2016-04-05: qty 30

## 2016-04-05 MED ORDER — PAROXETINE HCL 20 MG PO TABS
20.0000 mg | ORAL_TABLET | Freq: Every day | ORAL | Status: DC
  Filled 2016-04-05: qty 1

## 2016-04-05 MED ORDER — DEXAMETHASONE SODIUM PHOSPHATE 10 MG/ML IJ SOLN
INTRAMUSCULAR | Status: DC | PRN
  Administered 2016-04-05: 8 mg via INTRAVENOUS

## 2016-04-05 MED ORDER — INSULIN ASPART 100 UNIT/ML ~~LOC~~ SOLN
5.0000 [IU] | Freq: Once | SUBCUTANEOUS | Status: AC
Start: 2016-04-05 — End: 2016-04-05
  Administered 2016-04-05: 5 [IU] via SUBCUTANEOUS

## 2016-04-05 MED ORDER — SUGAMMADEX SODIUM 200 MG/2ML IV SOLN
INTRAVENOUS | Status: DC | PRN
  Administered 2016-04-05: 140 mg via INTRAVENOUS

## 2016-04-05 MED ORDER — ONDANSETRON HCL 4 MG PO TABS: 4.0000 mg | ORAL_TABLET | Freq: Four times a day (QID) | ORAL | Status: DC | PRN

## 2016-04-05 MED ORDER — METFORMIN HCL 500 MG PO TABS
500.0000 mg | ORAL_TABLET | Freq: Two times a day (BID) | ORAL | Status: DC
  Filled 2016-04-05 (×2): qty 1

## 2016-04-05 MED ORDER — GLYCOPYRROLATE 0.2 MG/ML IJ SOLN
INTRAMUSCULAR | Status: AC
  Filled 2016-04-05: qty 1

## 2016-04-05 MED ORDER — ONDANSETRON HCL 4 MG/2ML IJ SOLN: 4.0000 mg | Freq: Four times a day (QID) | INTRAMUSCULAR | Status: DC | PRN

## 2016-04-05 MED ORDER — LIDOCAINE HCL (CARDIAC) 20 MG/ML IV SOLN
INTRAVENOUS | Status: AC
  Filled 2016-04-05: qty 5

## 2016-04-05 MED ORDER — PHENYLEPHRINE 40 MCG/ML (10ML) SYRINGE FOR IV PUSH (FOR BLOOD PRESSURE SUPPORT)
PREFILLED_SYRINGE | INTRAVENOUS | Status: AC
  Filled 2016-04-05: qty 10

## 2016-04-05 MED ORDER — LINAGLIPTIN 5 MG PO TABS
5.0000 mg | ORAL_TABLET | Freq: Every day | ORAL | Status: DC
  Filled 2016-04-05 (×2): qty 1

## 2016-04-05 MED ORDER — PROPOFOL 10 MG/ML IV BOLUS
INTRAVENOUS | Status: AC
  Filled 2016-04-05: qty 20

## 2016-04-05 MED ORDER — COLESEVELAM HCL 625 MG PO TABS
2500.0000 mg | ORAL_TABLET | Freq: Every day | ORAL | Status: DC
  Filled 2016-04-05 (×3): qty 4

## 2016-04-05 MED ORDER — LABETALOL HCL 5 MG/ML IV SOLN
5.0000 mg | Freq: Once | INTRAVENOUS | Status: AC
  Administered 2016-04-05: 5 mg via INTRAVENOUS

## 2016-04-05 MED ORDER — FENTANYL CITRATE (PF) 100 MCG/2ML IJ SOLN
25.0000 ug | INTRAMUSCULAR | Status: DC | PRN
  Administered 2016-04-05: 50 ug via INTRAVENOUS
  Administered 2016-04-05: 25 ug via INTRAVENOUS
  Administered 2016-04-05: 50 ug via INTRAVENOUS
  Administered 2016-04-05: 25 ug via INTRAVENOUS

## 2016-04-05 MED ORDER — PANTOPRAZOLE SODIUM 40 MG PO TBEC: 40.0000 mg | DELAYED_RELEASE_TABLET | Freq: Every day | ORAL | Status: DC

## 2016-04-05 MED ORDER — NALOXONE HCL 0.4 MG/ML IJ SOLN: 0.4000 mg | INTRAMUSCULAR | Status: DC | PRN

## 2016-04-05 MED ORDER — FENTANYL CITRATE (PF) 250 MCG/5ML IJ SOLN
INTRAMUSCULAR | Status: AC
  Filled 2016-04-05: qty 5

## 2016-04-05 MED ORDER — ONDANSETRON HCL 4 MG/2ML IJ SOLN
INTRAMUSCULAR | Status: AC
  Filled 2016-04-05: qty 2

## 2016-04-05 MED ORDER — ROCURONIUM BROMIDE 100 MG/10ML IV SOLN
INTRAVENOUS | Status: AC
  Filled 2016-04-05: qty 1

## 2016-04-05 MED ORDER — ACETAMINOPHEN 10 MG/ML IV SOLN
1000.0000 mg | Freq: Once | INTRAVENOUS | Status: AC
  Administered 2016-04-05: 1000 mg via INTRAVENOUS
  Filled 2016-04-05: qty 100

## 2016-04-05 MED ORDER — INSULIN ASPART 100 UNIT/ML ~~LOC~~ SOLN
SUBCUTANEOUS | Status: AC
  Administered 2016-04-05: 5 [IU] via SUBCUTANEOUS
  Filled 2016-04-05: qty 1

## 2016-04-05 MED ORDER — LABETALOL HCL 5 MG/ML IV SOLN
5.0000 mg | Freq: Once | INTRAVENOUS | Status: AC
  Administered 2016-04-05: 11:00:00 via INTRAVENOUS

## 2016-04-05 MED ORDER — MIDAZOLAM HCL 2 MG/2ML IJ SOLN
INTRAMUSCULAR | Status: DC | PRN
  Administered 2016-04-05: 0.5 mg via INTRAVENOUS

## 2016-04-05 MED ORDER — GLIPIZIDE ER 2.5 MG PO TB24
2.5000 mg | ORAL_TABLET | Freq: Every day | ORAL | Status: DC
  Filled 2016-04-05 (×2): qty 1

## 2016-04-05 MED ORDER — FENTANYL CITRATE (PF) 100 MCG/2ML IJ SOLN
INTRAMUSCULAR | Status: DC | PRN
  Administered 2016-04-05 (×3): 50 ug via INTRAVENOUS
  Administered 2016-04-05 (×3): 25 ug via INTRAVENOUS
  Administered 2016-04-05: 50 ug via INTRAVENOUS

## 2016-04-05 MED ORDER — BUPIVACAINE-EPINEPHRINE (PF) 0.5% -1:200000 IJ SOLN
INTRAMUSCULAR | Status: DC | PRN
  Administered 2016-04-05: 1 mL via PERINEURAL

## 2016-04-05 MED ORDER — LOSARTAN POTASSIUM 50 MG PO TABS
50.0000 mg | ORAL_TABLET | Freq: Every day | ORAL | Status: DC | PRN
  Filled 2016-04-05: qty 1

## 2016-04-05 MED ORDER — LACTATED RINGERS IV SOLN
INTRAVENOUS | Status: DC
  Administered 2016-04-05: 22:00:00 via INTRAVENOUS

## 2016-04-05 MED ORDER — VALACYCLOVIR HCL 500 MG PO TABS
1000.0000 mg | ORAL_TABLET | Freq: Two times a day (BID) | ORAL | Status: DC | PRN
  Filled 2016-04-05: qty 2

## 2016-04-05 MED ORDER — ONDANSETRON HCL 4 MG/2ML IJ SOLN: 4.0000 mg | Freq: Once | INTRAMUSCULAR | Status: DC | PRN

## 2016-04-05 MED ORDER — DEXAMETHASONE SODIUM PHOSPHATE 4 MG/ML IJ SOLN
INTRAMUSCULAR | Status: AC
  Filled 2016-04-05: qty 1

## 2016-04-05 MED ORDER — SUGAMMADEX SODIUM 200 MG/2ML IV SOLN
INTRAVENOUS | Status: AC
  Filled 2016-04-05: qty 2

## 2016-04-05 MED ORDER — ROCURONIUM BROMIDE 100 MG/10ML IV SOLN
INTRAVENOUS | Status: DC | PRN
  Administered 2016-04-05: 5 mg via INTRAVENOUS
  Administered 2016-04-05: 50 mg via INTRAVENOUS

## 2016-04-05 MED ORDER — MENTHOL 3 MG MT LOZG
1.0000 | LOZENGE | OROMUCOSAL | Status: DC | PRN
Start: 2016-04-05 — End: 2016-04-06

## 2016-04-05 MED ORDER — LABETALOL HCL 5 MG/ML IV SOLN
INTRAVENOUS | Status: AC
  Filled 2016-04-05: qty 4

## 2016-04-05 MED ORDER — LIDOCAINE HCL (CARDIAC) 20 MG/ML IV SOLN
INTRAVENOUS | Status: DC | PRN
  Administered 2016-04-05: 40 mg via INTRAVENOUS

## 2016-04-05 MED ORDER — KETOROLAC TROMETHAMINE 30 MG/ML IJ SOLN
INTRAMUSCULAR | Status: AC
  Filled 2016-04-05: qty 1

## 2016-04-05 MED ORDER — FENTANYL CITRATE (PF) 100 MCG/2ML IJ SOLN
INTRAMUSCULAR | Status: AC
  Administered 2016-04-05: 50 ug via INTRAVENOUS
  Filled 2016-04-05: qty 2

## 2016-04-05 MED ORDER — ONDANSETRON HCL 4 MG/2ML IJ SOLN
INTRAMUSCULAR | Status: DC | PRN
  Administered 2016-04-05: 4 mg via INTRAVENOUS

## 2016-04-05 MED ORDER — BUPIVACAINE-EPINEPHRINE (PF) 0.5% -1:200000 IJ SOLN
INTRAMUSCULAR | Status: AC
  Filled 2016-04-05: qty 30

## 2016-04-05 MED ORDER — LEVOTHYROXINE SODIUM 50 MCG PO TABS
50.0000 ug | ORAL_TABLET | Freq: Every day | ORAL | Status: DC
  Filled 2016-04-05: qty 1

## 2016-04-05 MED ORDER — CEFOTETAN DISODIUM-DEXTROSE 2-2.08 GM-% IV SOLR
INTRAVENOUS | Status: AC
  Filled 2016-04-05: qty 50

## 2016-04-05 MED ORDER — MIDAZOLAM HCL 2 MG/2ML IJ SOLN
INTRAMUSCULAR | Status: AC
  Filled 2016-04-05: qty 2

## 2016-04-05 MED ORDER — CEFOTETAN DISODIUM-DEXTROSE 2-2.08 GM-% IV SOLR
2.0000 g | INTRAVENOUS | Status: AC
  Administered 2016-04-05: 2 g via INTRAVENOUS

## 2016-04-05 MED ORDER — EPHEDRINE SULFATE 50 MG/ML IJ SOLN
INTRAMUSCULAR | Status: DC | PRN
  Administered 2016-04-05 (×2): 5 mg via INTRAVENOUS
  Administered 2016-04-05: 10 mg via INTRAVENOUS
  Administered 2016-04-05: 5 mg via INTRAVENOUS

## 2016-04-05 MED ORDER — BUPIVACAINE HCL (PF) 0.25 % IJ SOLN
INTRAMUSCULAR | Status: DC | PRN
  Administered 2016-04-05: 7 mL

## 2016-04-05 MED ORDER — LACTATED RINGERS IV SOLN
INTRAVENOUS | Status: DC
  Administered 2016-04-05 (×2): via INTRAVENOUS

## 2016-04-05 MED ORDER — FENTANYL CITRATE (PF) 100 MCG/2ML IJ SOLN
INTRAMUSCULAR | Status: AC
Start: 2016-04-05 — End: 2016-04-05
  Filled 2016-04-05: qty 2

## 2016-04-05 MED ORDER — SODIUM CHLORIDE 0.9% FLUSH: 9.0000 mL | INTRAVENOUS | Status: DC | PRN

## 2016-04-05 MED ORDER — PHENYLEPHRINE HCL 10 MG/ML IJ SOLN
INTRAMUSCULAR | Status: DC | PRN
Start: 2016-04-05 — End: 2016-04-05
  Administered 2016-04-05 (×2): .04 mg via INTRAVENOUS
  Administered 2016-04-05: .08 mg via INTRAVENOUS
  Administered 2016-04-05: .04 mg via INTRAVENOUS

## 2016-04-05 SURGICAL SUPPLY — 56 items
CABLE HIGH FREQUENCY MONO STRZ (ELECTRODE) IMPLANT
CANISTER SUCT 3000ML (MISCELLANEOUS) ×4 IMPLANT
CLOTH BEACON ORANGE TIMEOUT ST (SAFETY) ×4 IMPLANT
COVER BACK TABLE 60X90IN (DRAPES) ×4 IMPLANT
DECANTER SPIKE VIAL GLASS SM (MISCELLANEOUS) IMPLANT
DERMABOND ADVANCED (GAUZE/BANDAGES/DRESSINGS) ×2
DERMABOND ADVANCED .7 DNX12 (GAUZE/BANDAGES/DRESSINGS) ×2 IMPLANT
DEVICE CAPIO SLIM SINGLE (INSTRUMENTS) IMPLANT
DRSG OPSITE POSTOP 3X4 (GAUZE/BANDAGES/DRESSINGS) ×4 IMPLANT
DURAPREP 26ML APPLICATOR (WOUND CARE) ×4 IMPLANT
ELECT REM PT RETURN 9FT ADLT (ELECTROSURGICAL) ×4
ELECTRODE REM PT RTRN 9FT ADLT (ELECTROSURGICAL) ×2 IMPLANT
GLOVE BIO SURGEON STRL SZ 6.5 (GLOVE) ×6 IMPLANT
GLOVE BIO SURGEONS STRL SZ 6.5 (GLOVE) ×2
GLOVE BIOGEL PI IND STRL 6.5 (GLOVE) ×2 IMPLANT
GLOVE BIOGEL PI IND STRL 7.0 (GLOVE) ×12 IMPLANT
GLOVE BIOGEL PI INDICATOR 6.5 (GLOVE) ×2
GLOVE BIOGEL PI INDICATOR 7.0 (GLOVE) ×12
GOWN STRL REUS W/TWL LRG LVL3 (GOWN DISPOSABLE) ×16 IMPLANT
LEGGING LITHOTOMY PAIR STRL (DRAPES) ×4 IMPLANT
NEEDLE HYPO 22GX1.5 SAFETY (NEEDLE) ×4 IMPLANT
NEEDLE MAYO 6 CRC TAPER PT (NEEDLE) IMPLANT
NS IRRIG 1000ML POUR BTL (IV SOLUTION) ×4 IMPLANT
PACK LAVH (CUSTOM PROCEDURE TRAY) ×4 IMPLANT
PACK ROBOTIC GOWN (GOWN DISPOSABLE) ×4 IMPLANT
PACK TRENDGUARD 450 HYBRID PRO (MISCELLANEOUS) ×2 IMPLANT
PACK TRENDGUARD 600 HYBRD PROC (MISCELLANEOUS) IMPLANT
PACK VAGINAL WOMENS (CUSTOM PROCEDURE TRAY) ×4 IMPLANT
PROTECTOR NERVE ULNAR (MISCELLANEOUS) ×8 IMPLANT
SEALER TISSUE G2 CVD JAW 45CM (ENDOMECHANICALS) ×4 IMPLANT
SET CYSTO W/LG BORE CLAMP LF (SET/KITS/TRAYS/PACK) IMPLANT
SET IRRIG TUBING LAPAROSCOPIC (IRRIGATION / IRRIGATOR) IMPLANT
SLEEVE XCEL OPT CAN 5 100 (ENDOMECHANICALS) ×4 IMPLANT
SUT CAPIO ETHIBPND (SUTURE) IMPLANT
SUT CAPIO POLYGLYCOLIC (SUTURE) IMPLANT
SUT MNCRL 0 MO-4 VIOLET 18 CR (SUTURE) ×4 IMPLANT
SUT MNCRL 0 VIOLET 6X18 (SUTURE) ×2 IMPLANT
SUT MON AB 2-0 CT1 27 (SUTURE) ×8 IMPLANT
SUT MON AB 2-0 CT1 36 (SUTURE) ×8 IMPLANT
SUT MON AB 3-0 SH 27 (SUTURE)
SUT MON AB 3-0 SH27 (SUTURE) IMPLANT
SUT MONOCRYL 0 6X18 (SUTURE) ×2
SUT MONOCRYL 0 MO 4 18  CR/8 (SUTURE) ×4
SUT VIC AB 0 CT1 18XCR BRD8 (SUTURE) ×2 IMPLANT
SUT VIC AB 0 CT1 8-18 (SUTURE) ×2
SUT VIC AB 3-0 PS2 18 (SUTURE) ×2
SUT VIC AB 3-0 PS2 18XBRD (SUTURE) ×2 IMPLANT
SUT VICRYL 0 UR6 27IN ABS (SUTURE) ×4 IMPLANT
TOWEL OR 17X24 6PK STRL BLUE (TOWEL DISPOSABLE) ×8 IMPLANT
TRAY FOLEY CATH SILVER 14FR (SET/KITS/TRAYS/PACK) ×4 IMPLANT
TRENDGUARD 450 HYBRID PRO PACK (MISCELLANEOUS) ×4
TRENDGUARD 600 HYBRID PROC PK (MISCELLANEOUS)
TROCAR OPTI TIP 5M 100M (ENDOMECHANICALS) ×4 IMPLANT
TROCAR XCEL NON-BLD 11X100MML (ENDOMECHANICALS) ×4 IMPLANT
WARMER LAPAROSCOPE (MISCELLANEOUS) ×4 IMPLANT
WATER STERILE IRR 1000ML POUR (IV SOLUTION) IMPLANT

## 2016-04-05 NOTE — Addendum Note (Signed)
Addendum  created 04/05/16 1703 by Jonna Munro, CRNA   Sign clinical note

## 2016-04-05 NOTE — Addendum Note (Signed)
Addendum  created 04/05/16 1418 by Georgeanne Nim, CRNA   Charge Capture section accepted

## 2016-04-05 NOTE — Anesthesia Postprocedure Evaluation (Signed)
Anesthesia Post Note  Patient: RAND BOLLER  Procedure(s) Performed: Procedure(s) (LRB): LAPAROSCOPIC ASSISTED VAGINAL HYSTERECTOMY possible BSO (N/A) ANTERIOR (CYSTOCELE) AND POSTERIOR REPAIR (RECTOCELE) (N/A)  Patient location during evaluation: PACU Anesthesia Type: General Level of consciousness: awake and alert and oriented Pain management: pain level controlled Vital Signs Assessment: post-procedure vital signs reviewed and stable Respiratory status: spontaneous breathing, nonlabored ventilation and respiratory function stable Cardiovascular status: blood pressure returned to baseline Anesthetic complications: no       Last Vitals:  Vitals:   04/05/16 1200 04/05/16 1202  BP: (!) 162/78 (!) 146/54  Pulse: 74 70  Resp: (!) 25 12  Temp:  36.4 C    Last Pain:  Vitals:   04/05/16 1202  TempSrc: Oral                 Kentavious Michele COKER

## 2016-04-05 NOTE — Transfer of Care (Signed)
Immediate Anesthesia Transfer of Care Note  Patient: [name]  Procedure(s) Performed: Procedure(s): LAPAROSCOPIC ASSISTED VAGINAL HYSTERECTOMY possible BSO (N/A) ANTERIOR (CYSTOCELE) AND POSTERIOR REPAIR (RECTOCELE) (N/A)  Patient Location: PACU  Anesthesia Type:General  Level of Consciousness: awake and patient cooperative  Airway & Oxygen Therapy: Patient Spontanous Breathing and Patient connected to nasal cannula oxygen  Post-op Assessment: Report given to RN and Post -op Vital signs reviewed and stable  Post vital signs: Reviewed and stable  Last Vitals:  Vitals:   04/05/16 0608  BP: 106/66  Pulse: 98  Resp: 20  Temp: 36.4 C    Last Pain:  Vitals:   04/05/16 0608  TempSrc: Oral      Patients Stated Pain Goal: 5 (92/11/94 1740)  Complications: No apparent anesthesia complications

## 2016-04-05 NOTE — Progress Notes (Signed)
Day of Surgery Procedure(s) (LRB): LAPAROSCOPIC ASSISTED VAGINAL HYSTERECTOMY possible BSO (N/A) ANTERIOR (CYSTOCELE) AND POSTERIOR REPAIR (RECTOCELE) (N/A)  Subjective: Patient reports tolerating PO.  c/o back pain.  No cp or sob.  No vb  Objective: I have reviewed patient's vital signs, intake and output, medications and labs.  General: alert and cooperative GI: normal findings: soft, non-tender Extremities: extremities normal, atraumatic, no cyanosis or edema  Assessment: s/p Procedure(s): LAPAROSCOPIC ASSISTED VAGINAL HYSTERECTOMY possible BSO (N/A) ANTERIOR (CYSTOCELE) AND POSTERIOR REPAIR (RECTOCELE) (N/A): stable  Plan: Advance diet Encourage ambulation continue PCA.  no toradol b/c elevated Cr  Heating pad for low back  LOS: 0 days    [name] 04/05/2016, 4:35 PM

## 2016-04-05 NOTE — Anesthesia Preprocedure Evaluation (Signed)
Anesthesia Evaluation  Patient identified by MRN, date of birth, ID band  Reviewed: Allergy & Precautions, NPO status , Patient's Chart, lab work & pertinent test results  Airway Mallampati: II  TM Distance: >3 FB Neck ROM: Full    Dental  (+) Edentulous Upper, Dental Advisory Given   Pulmonary former smoker,    breath sounds clear to auscultation       Cardiovascular hypertension,  Rhythm:Regular Rate:Normal     Neuro/Psych    GI/Hepatic   Endo/Other  diabetes  Renal/GU      Musculoskeletal   Abdominal   Peds  Hematology   Anesthesia Other Findings   Reproductive/Obstetrics                            Anesthesia Physical Anesthesia Plan  ASA: III  Anesthesia Plan: General   Post-op Pain Management:    Induction: Intravenous  Airway Management Planned: Oral ETT  Additional Equipment:   Intra-op Plan:   Post-operative Plan: Extubation in OR  Informed Consent: I have reviewed the patients History and Physical, chart, labs and discussed the procedure including the risks, benefits and alternatives for the proposed anesthesia with the patient or authorized representative who has indicated his/her understanding and acceptance.   Dental advisory given  Plan Discussed with: Anesthesiologist and Surgeon  Anesthesia Plan Comments:        Anesthesia Quick Evaluation

## 2016-04-05 NOTE — Anesthesia Postprocedure Evaluation (Signed)
Anesthesia Post Note  Patient: [name]  Procedure(s) Performed: Procedure(s) (LRB): LAPAROSCOPIC ASSISTED VAGINAL HYSTERECTOMY possible BSO (N/A) ANTERIOR (CYSTOCELE) AND POSTERIOR REPAIR (RECTOCELE) (N/A)  Patient location during evaluation: [facility] Anesthesia Type: General Level of consciousness: awake and alert and oriented Pain management: pain level controlled Vital Signs Assessment: post-procedure vital signs reviewed and stable Respiratory status: spontaneous breathing, nonlabored ventilation and patient connected to nasal cannula oxygen Cardiovascular status: stable Postop Assessment: no signs of nausea or vomiting and adequate PO intake Anesthetic complications: no        Last Vitals:  Vitals:   04/05/16 1530 04/05/16 1657  BP: (!) 138/59 (!) 152/76  Pulse: 75 89  Resp: 17 19  Temp: 36.6 C 36.7 C    Last Pain:  Vitals:   04/05/16 1657  TempSrc: Oral  PainSc:    Pain Goal: Patients Stated Pain Goal: 3 (04/05/16 1330)               [name]

## 2016-04-05 NOTE — Anesthesia Procedure Notes (Signed)
Procedure Name: Intubation Date/Time: 04/05/2016 7:35 AM Performed by: Georgeanne Nim Pre-anesthesia Checklist: Patient identified, Emergency Drugs available, Suction available, Patient being monitored and Timeout performed Patient Re-evaluated:Patient Re-evaluated prior to inductionOxygen Delivery Method: Circle system utilized Preoxygenation: Pre-oxygenation with 100% oxygen Intubation Type: IV induction Ventilation: Mask ventilation without difficulty and Oral airway inserted - appropriate to patient size Laryngoscope Size: Mac and 3 Grade View: Grade I Tube size: 7.0 mm Number of attempts: 1 Airway Equipment and Method: Stylet Placement Confirmation: breath sounds checked- equal and bilateral,  CO2 detector,  positive ETCO2 and ETT inserted through vocal cords under direct vision Secured at: 20 cm Tube secured with: Tape Dental Injury: Teeth and Oropharynx as per pre-operative assessment

## 2016-04-05 NOTE — Anesthesia Postprocedure Evaluation (Signed)
Anesthesia Post Note  Patient: [name]  Procedure(s) Performed: Procedure(s) (LRB): LAPAROSCOPIC ASSISTED VAGINAL HYSTERECTOMY possible BSO (N/A) ANTERIOR (CYSTOCELE) AND POSTERIOR REPAIR (RECTOCELE) (N/A)  Anesthesia Type: General       Last Vitals:  Vitals:   04/05/16 1200 04/05/16 1202  BP: (!) 162/78 (!) 146/54  Pulse: 74 70  Resp: (!) 25 12  Temp:  36.4 C    Last Pain:  Vitals:   04/05/16 1202  TempSrc: Oral                 Zada Haser COKER

## 2016-04-06 ENCOUNTER — Encounter (HOSPITAL_COMMUNITY): Payer: Self-pay | Admitting: Obstetrics and Gynecology

## 2016-04-06 DIAGNOSIS — N814 Uterovaginal prolapse, unspecified: Secondary | ICD-10-CM | POA: Diagnosis not present

## 2016-04-06 LAB — CBC
HCT: 29.3 % — ABNORMAL LOW (ref 36.0–46.0)
HEMOGLOBIN: 9.9 g/dL — AB (ref 12.0–15.0)
MCH: 29.1 pg (ref 26.0–34.0)
MCHC: 33.8 g/dL (ref 30.0–36.0)
MCV: 86.2 fL (ref 78.0–100.0)
Platelets: 226 10*3/uL (ref 150–400)
RBC: 3.4 MIL/uL — ABNORMAL LOW (ref 3.87–5.11)
RDW: 14.2 % (ref 11.5–15.5)
WBC: 10.3 10*3/uL (ref 4.0–10.5)

## 2016-04-06 MED ORDER — OXYCODONE-ACETAMINOPHEN 5-325 MG PO TABS: 1.0000 | ORAL_TABLET | ORAL | 0 refills | Status: DC | PRN

## 2016-04-06 MED ORDER — IBUPROFEN 600 MG PO TABS: 600.0000 mg | ORAL_TABLET | Freq: Four times a day (QID) | ORAL | 0 refills | Status: DC | PRN

## 2016-04-06 NOTE — Progress Notes (Signed)
Inpatient Diabetes Program Recommendations  AACE/ADA: New Consensus Statement on Inpatient Glycemic Control (2015)  Target Ranges:  Prepandial:   less than 140 mg/dL      Peak postprandial:   less than 180 mg/dL (1-2 hours)      Critically ill patients:  140 - 180 mg/dL   Results for [name], CHACKO (MRN [id number]) as of 04/06/2016 08:26  Ref. Range 04/05/2016 06:14 04/05/2016 10:15 04/05/2016 11:46  Glucose-Capillary Latest Ref Range: 65 - 99 mg/dL 187 (H) 262 (H) 275 (H)   Review of Glycemic Control  Diabetes history: DM2 Outpatient Diabetes medications: Glipizide XL 2.5 mg QAM, Metformin 500 mg BID, Januvia 100 mg daily Current orders for Inpatient glycemic control: Glipizide XL 2.5 mg QAm, Metformin 500 mg BID, Tradjenta 5 mg daily  Inpatient Diabetes Program Recommendations: Correction (SSI): While inpatient, please consider using Glycemic control order set and ordering CBGs with Novolog correction scale ACHS. Diet: Please change diet from Regular to Carb Modified.   Thanks, Barnie Alderman, RN, MSN, CDE Diabetes Coordinator Inpatient Diabetes Program [phone] (Team Pager from 8am to 5pm)

## 2016-04-09 NOTE — Op Note (Signed)
NAME:  TAZIA, ILLESCAS                    ACCOUNT NO.:  MEDICAL RECORD NO.:  [id number]  LOCATION:                                 FACILITY:  PHYSICIAN:  [name], MD    DATE OF BIRTH:  19-Sep-1940  DATE OF PROCEDURE:  04/05/2016 DATE OF DISCHARGE:  04/06/2016                              OPERATIVE REPORT   PREOPERATIVE DIAGNOSIS:  Symptomatic pelvic organ prolapse.  POSTOPERATIVE DIAGNOSIS:  Symptomatic pelvic organ prolapse.  PROCEDURE: 1. Laparoscopic-assisted vaginal hysterectomy with bilateral salpingo-     oophorectomy. 2. Anterior and posterior repair.  SURGEON:  [name], MD.  ASSISTANTRadene Knee.  ANESTHESIA:  General.  COMPLICATIONS:  None.  SPECIMEN:  Uterus, cervix, bilateral tubes and ovaries.  PROCEDURE:  The patient was taken to the operating room after informed consent was obtained.  A time-out was performed.  She was placed in the dorsal lithotomy position using Allen stirrups, prepped and draped in sterile fashion and a Foley catheter was inserted sterilely.  A bivalve speculum was placed in the vagina and a single-tooth tenaculum placed on the anterior lip of the cervix.  Acorn clamp was applied and the speculum was removed and our attention was turned to the abdomen.  A 0.25% Marcaine was used to provide local anesthesia and a small infraumbilical skin incision was made with a scalpel.  This was extended to the fascia using a Kelly clamp.  Optical trocar was then inserted under direct visualization.  Once intraperitoneal placement was confirmed, CO2 was turned on and the abdomen and pelvis were insufflated.  Survey was performed.  Bilateral tubes, ovaries and uterus appeared normal.  No abnormalities were identified.  A suprapubic incision was made and a 5 mm trocar was inserted under direct visualization.  The uterus was manipulated to the patient's left side and an atraumatic grasper was used to grasp the right adnexa.   The infundibulopelvic ligament was grasped, cauterized, and cut using the EnSeal device.  This incision was extended using the EnSeal down the broad ligament to the level of the round ligament.  The round ligament was grasped, cauterized, and cut.  Hemostasis was achieved and this procedure was repeated on the opposite side.  All instruments were then removed from the abdomen and our attention was turned to the vagina. Acorn and tenaculum were removed.  A weighted speculum was placed in the posterior vagina and a Deaver was placed anteriorly.  A circumferential incision was made using the Bovie.  The posterior cul-de-sac was entered sharply using curved Mayo scissors and a long-weighted speculum was placed in the posterior cul-de-sac.  Bilateral uterosacral ligaments were clamped, cut, and suture ligated.  The anterior cul-de-sac was entered sharply and a Deaver was placed anteriorly.  The cardinal ligaments and uterine arteries were grasped, cut, and suture ligated. The uterus was grasped at the fundus and delivered through the posterior cul-de-sac.  Remaining pedicles were clamped, and the uterus was excised and passed off to be sent to Pathology.  Remaining pedicles were suture ligated and hemostasis was noted bilaterally.  A modified McCall stitch was placed in the posterior cuff, was run using Monocryl.  Excellent  hemostasis was noted.  Anterior and posterior repair were then completed in standard fashion.  The vaginal cuff was reapproximated using a series of figure-of-eight sutures.  Hemostasis was assured and all instruments were removed from the vagina.  Our attention was then returned to the abdomen.  All pedicles were inspected and found to be hemostatic. Vaginal cuff was hemostatic.  All instruments and trocars were removed. A deep stitch was placed in the infraumbilical incision and both skin incisions were closed with Vicryl.  Skin glue was placed over top.  The patient was  extubated, taken to the recovery room in stable condition. Sponge, lap, needle, and instrument counts were correct x2.     [name], MD     GA/MEDQ  D:  04/06/2016  T:  04/06/2016  Job:  [id number]

## 2016-04-12 NOTE — Discharge Summary (Signed)
Physician Discharge Summary  Patient ID: [name] MRN: [id number] DOB/AGE: Sep 20, 1940 76 y.o.  Admit date: 04/05/2016 Discharge date: 04/12/2016  Admission Diagnoses:  Discharge Diagnoses:  Active Problems:   Prolapse of female pelvic organs   Discharged Condition: stable  Hospital Course: pt admitted for postop care.  Pain controlled with IV meds.  Once pt was able to tolerate oral intake, her pain was controlled with PO medications.  POD1, she was able to ambulate and her foley was discontinued.  She was able to void and pain was well controlled.  She remained afebrile with sable VS and labs.    Consults: None  Significant Diagnostic Studies: labs: cbc  Treatments: surgery: LAVH/BSO  Discharge Exam: Blood pressure (!) 108/40, pulse (!) 111, temperature 97.9 F (36.6 C), temperature source Oral, resp. rate 16, height 5\' 3"  (1.6 m), weight 153 lb (69.4 kg), SpO2 97 %. General appearance: alert and cooperative Resp: clear to auscultation bilaterally GI: normal findings: soft, non-tender Incision/Wound:clean and intact  Disposition: 01-Home or Self Care  Discharge Instructions    (HEART FAILURE PATIENTS) Call MD:  Anytime you have any of the following symptoms: 1) 3 pound weight gain in 24 hours or 5 pounds in 1 week 2) shortness of breath, with or without a dry hacking cough 3) swelling in the hands, feet or stomach 4) if you have to sleep on extra pillows at night in order to breathe.    Complete by:  As directed    Call MD for:  difficulty breathing, headache or visual disturbances    Complete by:  As directed    Call MD for:  extreme fatigue    Complete by:  As directed    Call MD for:  hives    Complete by:  As directed    Call MD for:  persistant dizziness or light-headedness    Complete by:  As directed    Call MD for:  persistant nausea and vomiting    Complete by:  As directed    Call MD for:  redness, tenderness, or signs of infection (pain, swelling,  redness, odor or green/yellow discharge around incision site)    Complete by:  As directed    Call MD for:  severe uncontrolled pain    Complete by:  As directed    Call MD for:  temperature >100.4    Complete by:  As directed    Diet - low sodium heart healthy    Complete by:  As directed    Increase activity slowly    Complete by:  As directed      Allergies as of 04/06/2016      Reactions   Codeine Nausea And Vomiting, Other (See Comments)   Severe stomach cramps   Antihistamines, Diphenhydramine-type Other (See Comments)   Causes hyperactivity   Statins Other (See Comments)   Joint pains   Erythromycin Hives, Rash   Due to dental work about 50 years ago. Was used in packing and resulted in rash/hives inside and outside of mouth.      Medication List    TAKE these medications   aspirin 81 MG tablet Take 1 tablet (81 mg total) by mouth daily. Resume 4 days post-op What changed:  additional instructions   colesevelam 625 MG tablet Commonly known as:  WELCHOL Take 2,500 mg by mouth daily.   docusate sodium 100 MG capsule Commonly known as:  COLACE Take 1 capsule (100 mg total) by mouth 2 (two) times daily.  glipiZIDE 2.5 MG 24 hr tablet Commonly known as:  GLUCOTROL XL Take 2.5 mg by mouth daily with breakfast.   ibuprofen 200 MG tablet Commonly known as:  ADVIL,MOTRIN Take 1 tablet (200 mg total) by mouth every 6 (six) hours as needed for mild pain or moderate pain. Resume 5 days post-op as needed What changed:  how much to take  additional instructions   ibuprofen 600 MG tablet Commonly known as:  ADVIL,MOTRIN Take 1 tablet (600 mg total) by mouth every 6 (six) hours as needed. What changed:  You were already taking a medication with the same name, and this prescription was added. Make sure you understand how and when to take each.   levothyroxine 50 MCG tablet Commonly known as:  SYNTHROID, LEVOTHROID Take 50 mcg by mouth daily before breakfast.    losartan 100 MG tablet Commonly known as:  COZAAR Take 50 mg by mouth daily as needed (for elevated BP).   metFORMIN 1000 MG tablet Commonly known as:  GLUCOPHAGE Take 500 mg by mouth 2 (two) times daily.   nitroGLYCERIN 0.4 MG SL tablet Commonly known as:  NITROSTAT Place 1 tablet (0.4 mg total) under the tongue every 5 (five) minutes as needed for chest pain.   oxyCODONE-acetaminophen 5-325 MG tablet Commonly known as:  PERCOCET/ROXICET Take 1-2 tablets by mouth every 4 (four) hours as needed for severe pain (moderate to severe pain (when tolerating fluids)).   pantoprazole 40 MG tablet Commonly known as:  PROTONIX Take 40 mg by mouth daily.   PARoxetine 20 MG tablet Commonly known as:  PAXIL Take 20 mg by mouth every morning.   polyethylene glycol packet Commonly known as:  MIRALAX Take 17 g by mouth daily.   sitaGLIPtin 100 MG tablet Commonly known as:  JANUVIA Take 100 mg by mouth daily.   SLOW MAGNESIUM/CALCIUM PO Take 1 tablet by mouth daily.   tamoxifen 20 MG tablet Commonly known as:  NOLVADEX Take 1 tablet by mouth  daily   valACYclovir 1000 MG tablet Commonly known as:  VALTREX Take 1,000 mg by mouth 2 (two) times daily as needed (for viral infection).      Follow-up Information    Arnisha Laffoon, MD. Schedule an appointment as soon as possible for a visit in 2 week(s).   Specialty:  Obstetrics and Gynecology Contact information: [place], [place] [address] [phone]           Signed: [name] 04/12/2016, 9:31 AM

## 2016-04-26 ENCOUNTER — Encounter: Payer: Self-pay | Admitting: Cardiovascular Disease

## 2016-05-11 NOTE — Progress Notes (Signed)
Patient ID: [name], female   DOB: 10-27-1940, 76 y.o.   MRN: [id number]    05/14/2016 [name]   [birth year]  [id number]  Primary Physician [name] Inches, MD Primary Cardiologist: [name]  HPI:  76 y.o.  female with a history of CAD, s/p RCA HSRA and DES (Taxus) in 2007. Cath in 2008 showed patent RCA and no significant Lt system disease. She has done well from a cardiac standpoint. She had breast cancer in 2012 and has not had recurrence. She had back surgery in Sept 2016 and has done well. She is in the office today for a 6 month check up. She denies any palpitations, dyspnea, or chest pain.   Studies reviewed from 06/13/15   Echo results-Normal EF mild AS mean gradient 16 mm Hg peak 26 mmHg  f/u echo in a year LE ultrasound results- Normal circulation 11/11/15 Carotid duplex [id number]% LICA stenosis. 10/2016  Seen at urgent care 09/10/15  for chest pain.  Sounded more like indigestion  R/O no acute Changes d/c with carafate subsequently had uncomplicated back surgery   Myovue reviewed 11/22/15 normal no ischemia EF 73%   Had uncomplicated lap hysterectomy [name] [name] 04/05/16    BP labile and HR seems high at times   ECG in office today is normal HR 94    Current Outpatient Prescriptions  Medication Sig Dispense Refill  . aspirin 81 MG tablet Take 1 tablet (81 mg total) by mouth daily. Resume 4 days post-op    . colesevelam (WELCHOL) 625 MG tablet Take 2,500 mg by mouth daily.    Marland Kitchen docusate sodium (COLACE) 100 MG capsule Take 1 capsule (100 mg total) by mouth 2 (two) times daily. 30 capsule 1  . glipiZIDE (GLUCOTROL XL) 2.5 MG 24 hr tablet Take 2.5 mg by mouth daily with breakfast.    . ibuprofen (ADVIL,MOTRIN) 200 MG tablet Take 1 tablet (200 mg total) by mouth every 6 (six) hours as needed for mild pain or moderate pain. Resume 5 days post-op as needed (Patient taking differently: Take 400 mg by mouth every 6 (six) hours as needed for mild pain or moderate  pain. Resume 5 days post-op as needed)  0  . levothyroxine (SYNTHROID, LEVOTHROID) 50 MCG tablet Take 50 mcg by mouth daily before breakfast.     . losartan (COZAAR) 25 MG tablet Take 1 tablet (25 mg total) by mouth daily. 90 tablet 3  . Magnesium Cl-Calcium Carbonate (SLOW MAGNESIUM/CALCIUM PO) Take 1 tablet by mouth daily.    . metFORMIN (GLUCOPHAGE) 1000 MG tablet Take 500 mg by mouth 2 (two) times daily.    . nitroGLYCERIN (NITROSTAT) 0.4 MG SL tablet Place 1 tablet (0.4 mg total) under the tongue every 5 (five) minutes as needed for chest pain. 25 tablet 3  . pantoprazole (PROTONIX) 40 MG tablet Take 40 mg by mouth daily.    Marland Kitchen PARoxetine (PAXIL) 20 MG tablet Take 20 mg by mouth every morning.    . polyethylene glycol (MIRALAX) packet Take 17 g by mouth daily. 14 each 0  . sitaGLIPtin (JANUVIA) 100 MG tablet Take 100 mg by mouth daily.    . valACYclovir (VALTREX) 1000 MG tablet Take 1,000 mg by mouth 2 (two) times daily as needed (for viral infection).     No current facility-administered medications for this visit.     Allergies  Allergen Reactions  . Codeine Nausea And Vomiting and Other (See Comments)    Severe stomach cramps  . Antihistamines,  Diphenhydramine-Type Other (See Comments)    Causes hyperactivity  . Statins Other (See Comments)    Joint pains  . Erythromycin Hives and Rash    Due to dental work about 50 years ago. Was used in packing and resulted in rash/hives inside and outside of mouth.    Social History   Social History  . Marital status: Married    Spouse name: N/A  . Number of children: 2  . Years of education: N/A   Occupational History  . retired    Social History Main Topics  . Smoking status: Former Smoker    Packs/day: 2.00    Years: 40.00    Quit date: 12/19/1997  . Smokeless tobacco: Never Used  . Alcohol use 3.6 oz/week    6 Glasses of wine per week     Comment: occasional  . Drug use: No  . 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 Not on file   Social History Narrative   [name] age 75- [name] age 33  [name]     Review of Systems: General: negative for chills, fever, night sweats or weight changes.  Cardiovascular: negative for chest pain, dyspnea on exertion, edema, orthopnea, palpitations, paroxysmal nocturnal dyspnea or shortness of breath Dermatological: negative for rash Respiratory: negative for cough or wheezing Urologic: negative for hematuria Abdominal: negative for nausea, vomiting, diarrhea, bright red blood per rectum, melena, or hematemesis Neurologic: negative for visual changes, syncope, or dizziness All other systems reviewed and are otherwise negative except as noted above.    Blood pressure 138/80, pulse (!) 105, height 5\' 3"  (1.6 m), weight 152 lb (68.9 kg), SpO2 98 %.  General appearance: alert, cooperative and no distress Neck: no carotid bruit and no JVD Lungs: clear to auscultation bilaterally Heart: regular rate and rhythm and 2/6 systolic murmur AOV, preserved S2 Extremities: extremities normal, atraumatic, no cyanosis or edema Skin: Skin color, texture, turgor normal. No rashes or lesions Neurologic: Grossly normal   ECG:  12/27/14 SR rate 91 normal  05/14/16  SR rate 94 normal   ASSESSMENT AND PLAN:   CAD S/P PCI 2007 RCA HSRA and Taxus stent in 2007 by [name].  Re look cath in 2008 OK, no Lt system disease and RCA patent  11/22/15 myovue normal ef 59%   Bruit:  Left ICA 40-59% f/u duplex 10/2016  Essential hypertension Labile low in office and at home Decrease losartan to 25 mg   Elevated lipids On Welchol, statin intolerant- LDL was 83 in 2010, followed by PCP  Mild aortic stenosis Echo 06/13/15 mean gradient 16 mmHg stable since 2015 f/u echo 05/2014  History of breast cancer Rt mastectomy Dec 2012 followed by chemo and radiation  Spinal stenosis of lumbar  region Surgery Sept 2016 suspect her leg pain is from this  ABI's normal 06/13/15  GERD :  Continue protonix low carb diet finished with carafate   OB/GYN:  Post hysterectomy on 04/05/16  Recovering well no cardiac complications f/u [name] [name]    F/u with me in 6 months    [name]

## 2016-05-14 ENCOUNTER — Ambulatory Visit (INDEPENDENT_AMBULATORY_CARE_PROVIDER_SITE_OTHER): Payer: Medicare Other | Admitting: Cardiovascular Disease

## 2016-05-14 ENCOUNTER — Encounter: Payer: Self-pay | Admitting: Cardiovascular Disease

## 2016-05-14 VITALS — BP 138/80 | HR 105 | Ht 63.0 in | Wt 152.0 lb

## 2016-05-14 DIAGNOSIS — I6523 Occlusion and stenosis of bilateral carotid arteries: Secondary | ICD-10-CM | POA: Diagnosis not present

## 2016-05-14 DIAGNOSIS — I350 Nonrheumatic aortic (valve) stenosis: Secondary | ICD-10-CM

## 2016-05-14 MED ORDER — LOSARTAN POTASSIUM 25 MG PO TABS: 25.0000 mg | ORAL_TABLET | Freq: Every day | ORAL | 3 refills | Status: DC

## 2016-05-14 NOTE — Patient Instructions (Addendum)
Medication Instructions:  Your physician has recommended you make the following change in your medication:  1-DECREASE Cozaar 25 mg by mouth daily  Labwork: NONE  Testing/Procedures: NONE  Follow-Up: Your physician wants you to follow-up in: 6 months with Dr. Johnsie Cancel. You will receive a reminder letter in the mail two months in advance. If you don't receive a letter, please call our office to schedule the follow-up appointment.   If you need a refill on your cardiac medications before your next appointment, please call your pharmacy.

## 2016-06-04 ENCOUNTER — Telehealth: Payer: Self-pay | Admitting: Cardiovascular Disease

## 2016-06-04 NOTE — Telephone Encounter (Signed)
error 

## 2016-06-05 ENCOUNTER — Ambulatory Visit (HOSPITAL_COMMUNITY)
Admission: RE | Admit: 2016-06-05 | Discharge: 2016-06-05 | Disposition: A | Discharge status: Home / Self Care | Payer: Medicare Other | Source: Ambulatory Visit | Attending: Cardiology | Admitting: Cardiology

## 2016-06-05 DIAGNOSIS — I6523 Occlusion and stenosis of bilateral carotid arteries: Secondary | ICD-10-CM | POA: Insufficient documentation

## 2016-06-05 DIAGNOSIS — R0989 Other specified symptoms and signs involving the circulatory and respiratory systems: Secondary | ICD-10-CM | POA: Diagnosis present

## 2016-10-01 IMAGING — DX DG SPINE 1V PORT
1 series · 1 of 1 positions shown · non-contrast
Comparison: Intraoperative localization film earlier this same day.

CLINICAL DATA: Patient for lower lumbar surgery. Intraoperative
localization film.

EXAM:
PORTABLE SPINE - 1 VIEW

[ls-spine x-table]
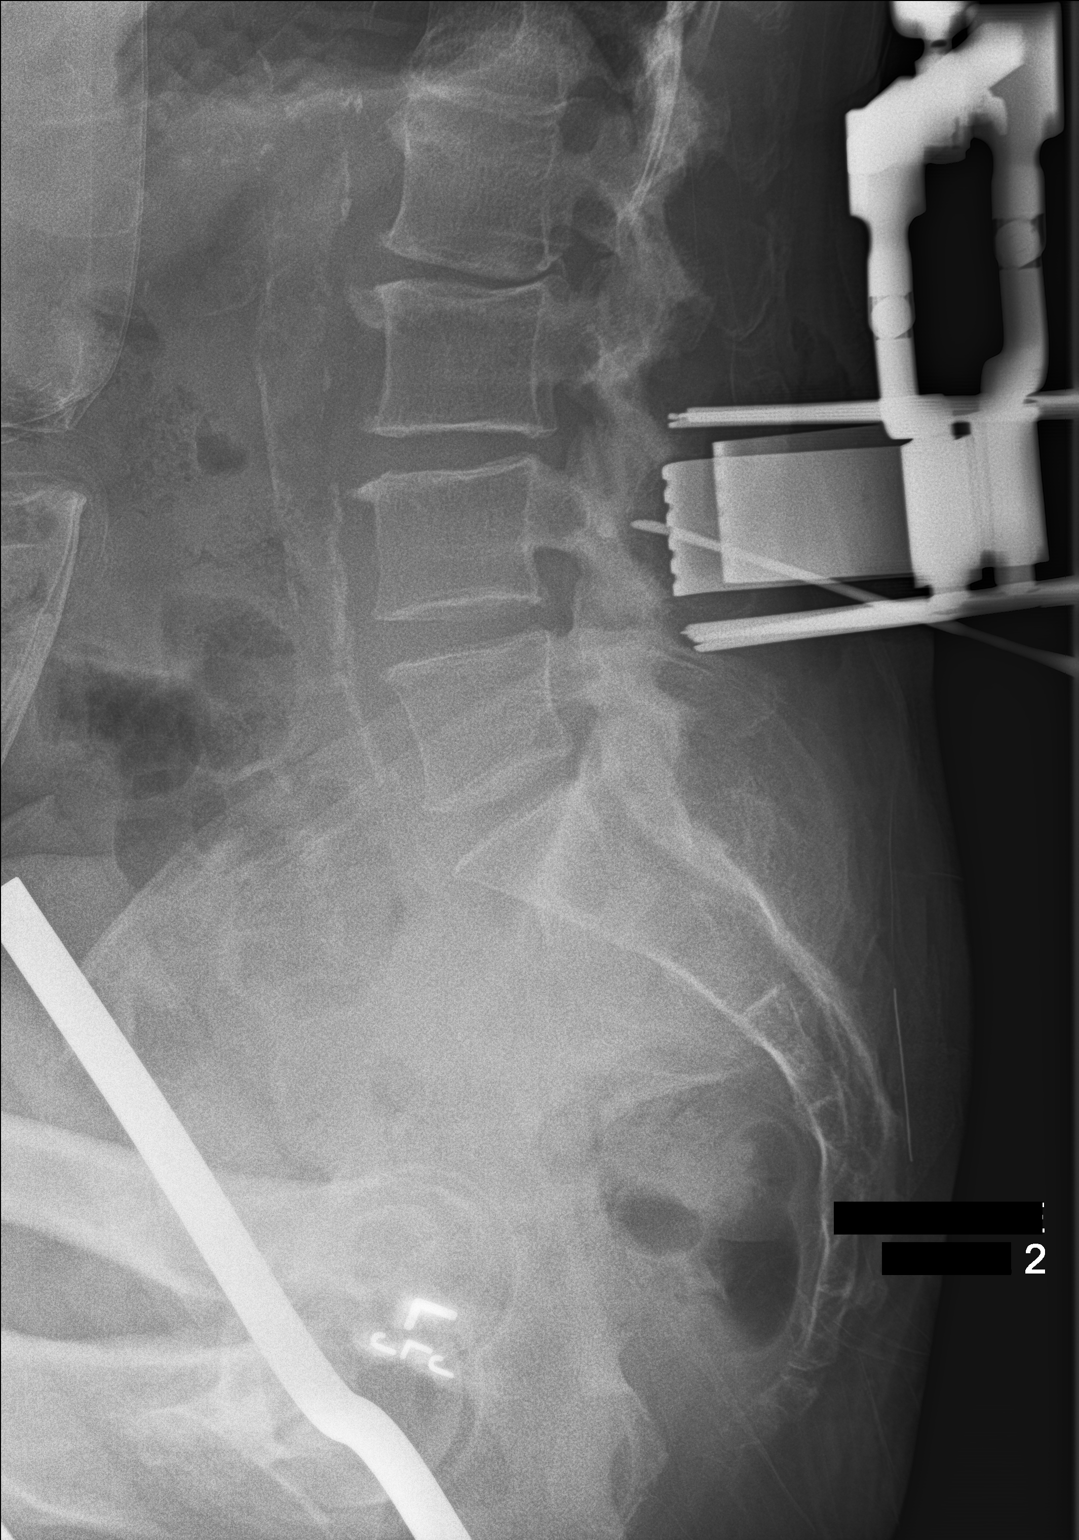

[1 of 1 positions shown; findings below may reference images not displayed]

FINDINGS: Spinous process clamp is in place and is mainly centered on the L4
spinous process although a portion of the clamp appears attached to
the L3 spinous process. Two metallic probes are identified with one
at the level of the L3 inferior endplate and a second at the level
the superior aspect of the L5 pedicles.
IMPRESSION: Localization as above.

## 2016-10-01 IMAGING — DX DG SPINE 1V PORT
1 series · 1 of 1 positions shown · non-contrast
Comparison: Plain films lumbar spine 10/12/2014.

CLINICAL DATA: Patient for L3-5 lumbar surgery. Intraoperative
localization film.

EXAM:
PORTABLE SPINE - 1 VIEW

[ls-spine lat]
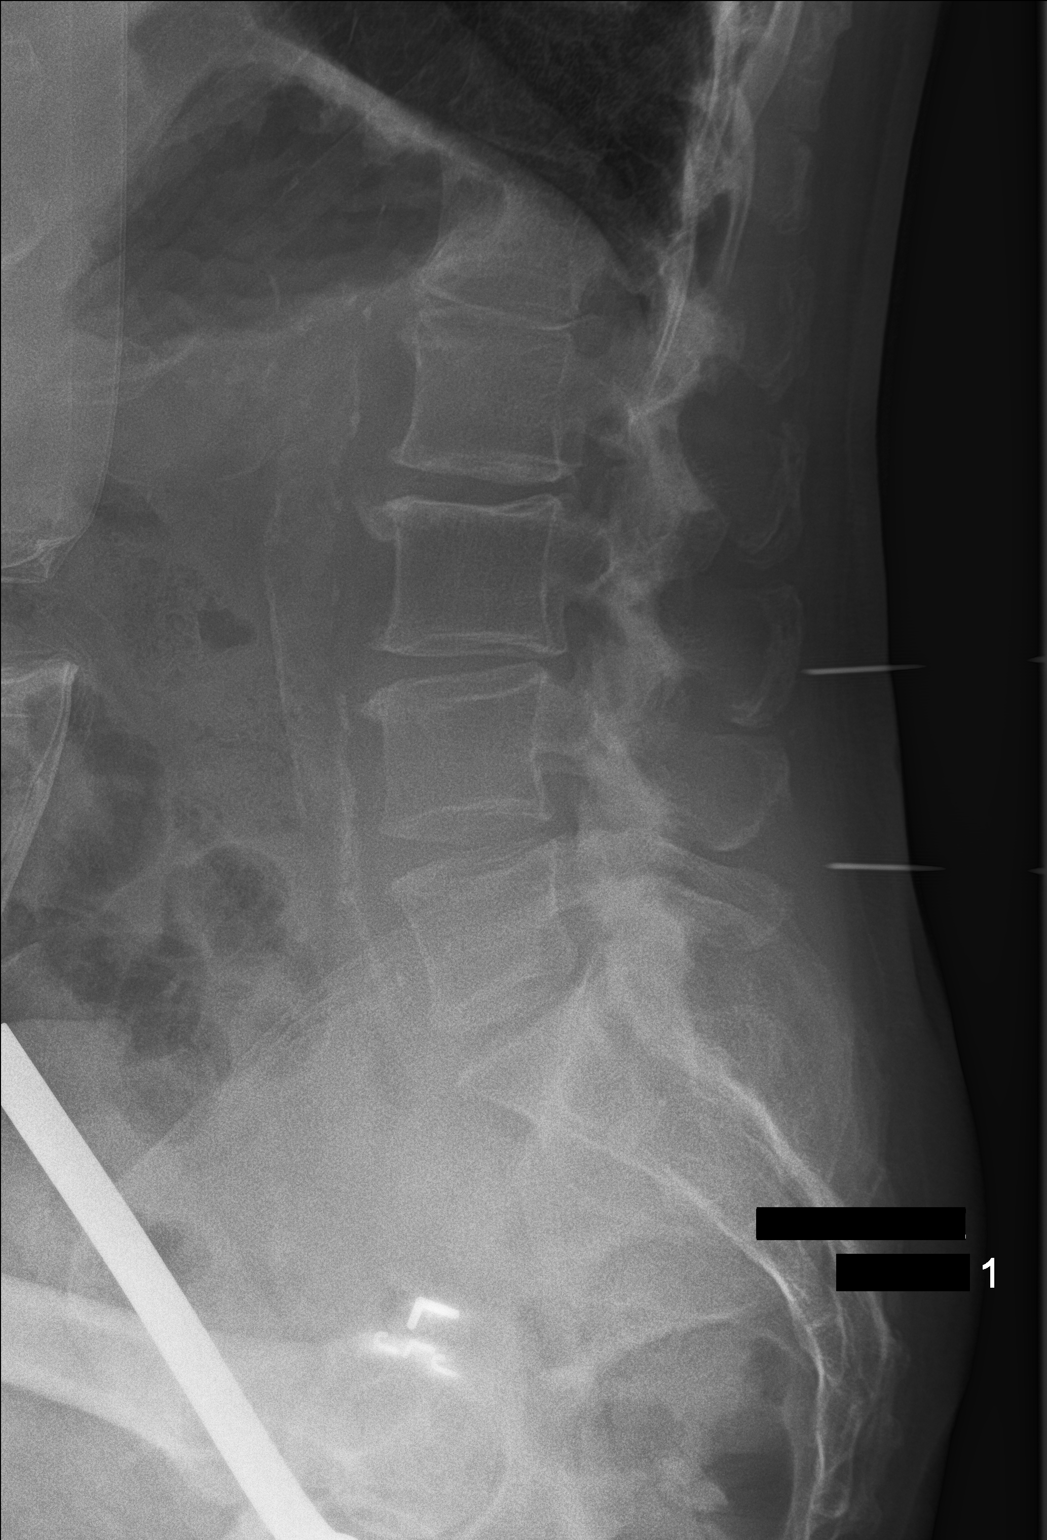

[1 of 1 positions shown; findings below may reference images not displayed]

FINDINGS: Two metallic probes are in place. The more superior is at the level
of the L3 spinous process. Second, more inferior probe is directed
toward the space between the L4 and L5 spinous processes.
IMPRESSION: Localization as above.

## 2016-10-01 IMAGING — DX DG SPINE 1V PORT
1 series · 1 of 1 positions shown · non-contrast
Comparison: Radiographs dated 10/20/2014 and 10/12/2014

CLINICAL DATA: Lumbar disc disease.

EXAM:
PORTABLE SPINE - 1 VIEW

[ls-spine x-table]
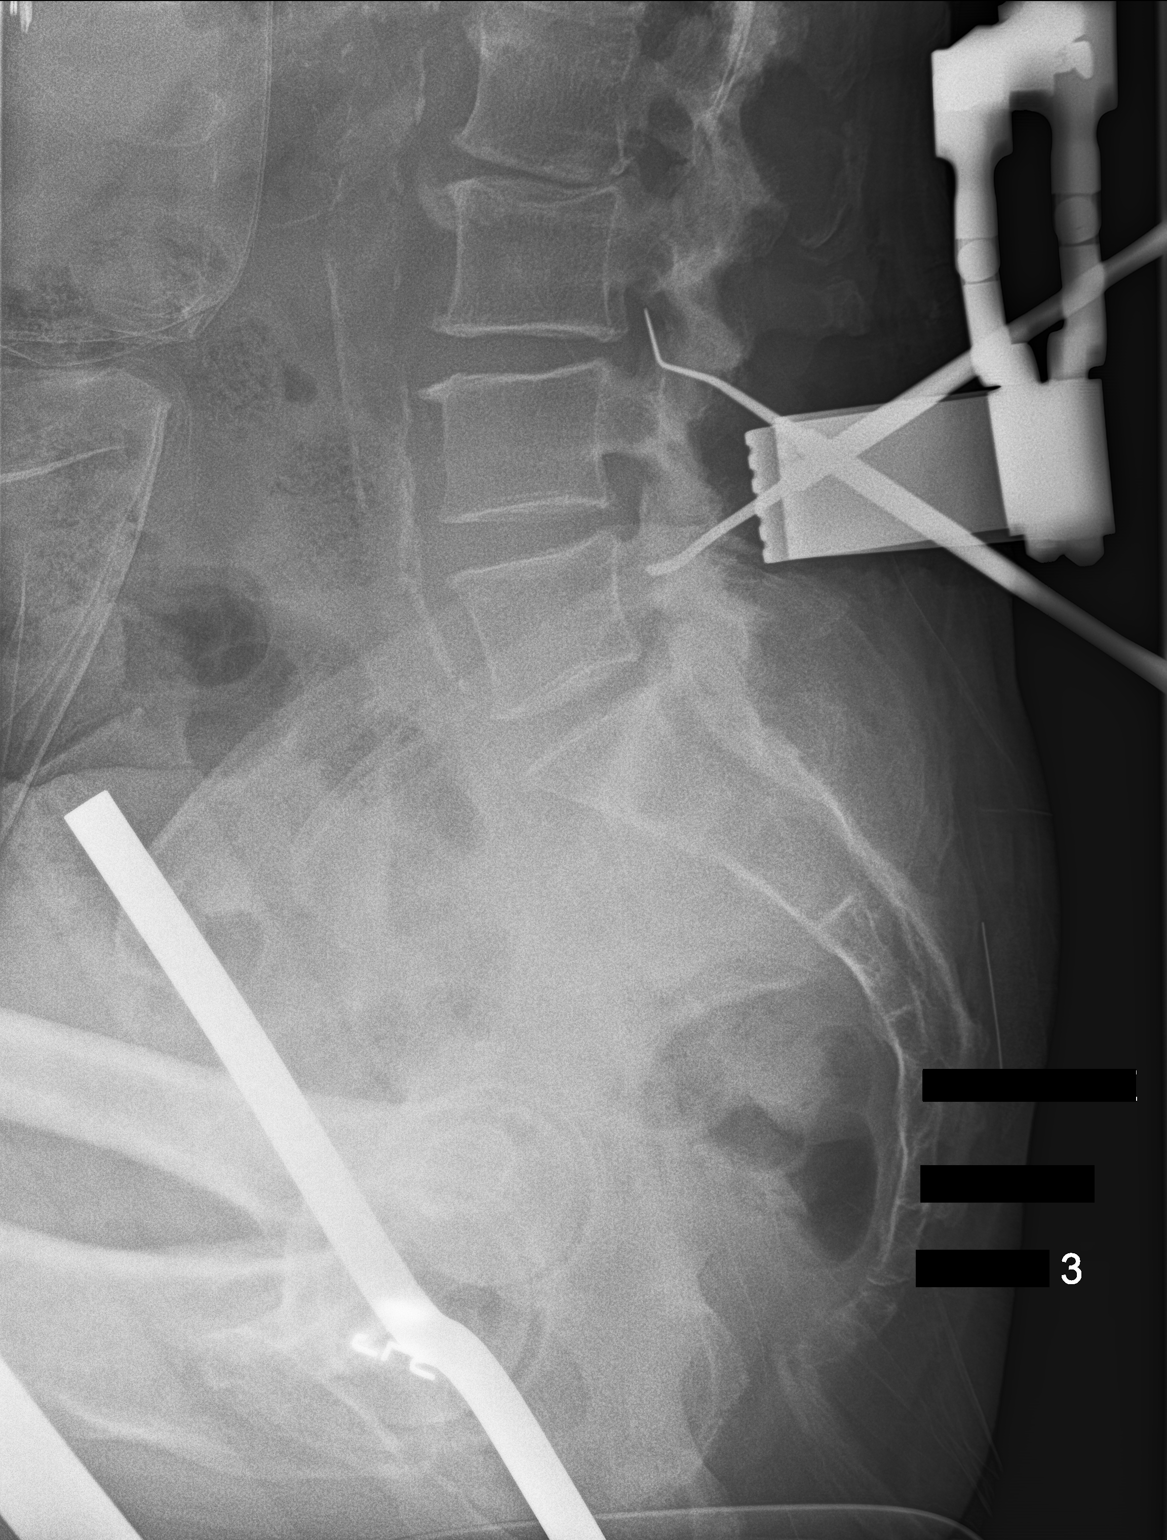

[1 of 1 positions shown; findings below may reference images not displayed]

FINDINGS: Image 3 demonstrates an instrument at the L3-4 level and an
instrument at the level of the pedicles of L5.
IMPRESSION: Instruments at L3-4 and at L5.

## 2016-11-13 ENCOUNTER — Other Ambulatory Visit: Payer: Self-pay | Admitting: Obstetrics and Gynecology

## 2016-11-13 DIAGNOSIS — Z853 Personal history of malignant neoplasm of breast: Secondary | ICD-10-CM

## 2016-11-13 DIAGNOSIS — N644 Mastodynia: Secondary | ICD-10-CM

## 2016-11-27 NOTE — Progress Notes (Signed)
Patient ID: [name], female   DOB: Oct 26, 1940, 76 y.o.   MRN: [id number]    11/30/2016 [name]   [birth year]  [id number]  Primary Physician Bernerd Limbo, MD Primary Cardiologist: Dr Johnsie Cancel  HPI:  76 y.o.  female with a history of CAD, s/p RCA HSRA and DES (Taxus) in 2007. Cath in 2008 showed patent RCA and no significant Lt system disease. She has done well from a cardiac standpoint. She had breast cancer in 2012 and has not had recurrence. She denies any palpitations, dyspnea, or chest pain.   Studies reviewed from 06/13/15   Echo results-Normal EF mild AS mean gradient 16 mm Hg peak 26 mmHg  f/u echo in a year LE ultrasound results- Normal circulation 11/11/15 Carotid duplex [id number]% LICA stenosis. 10/2016  Seen at urgent care 09/10/15  for chest pain.  Sounded more like indigestion  R/O no acute Changes d/c with carafate subsequently had uncomplicated back surgery   Myovue reviewed 11/22/15 normal no ischemia EF 60%   Had uncomplicated lap hysterectomy Dr Julien Girt 04/05/16    BP labile and HR seems high at times   Pain in legs seems like mixture of possible PAD, arthritis and neuropathy in feet   Current Outpatient Medications  Medication Sig Dispense Refill  . aspirin 81 MG tablet Take 1 tablet (81 mg total) by mouth daily. Resume 4 days post-op    . colesevelam (WELCHOL) 625 MG tablet Take 2,500 mg by mouth daily.    Marland Kitchen glipiZIDE (GLUCOTROL XL) 2.5 MG 24 hr tablet Take 2.5 mg by mouth daily with breakfast.    . ibuprofen (ADVIL,MOTRIN) 200 MG tablet Take 1 tablet (200 mg total) by mouth every 6 (six) hours as needed for mild pain or moderate pain. Resume 5 days post-op as needed (Patient taking differently: Take 400 mg by mouth every 6 (six) hours as needed for mild pain or moderate pain. Resume 5 days post-op as needed)  0  . levothyroxine (SYNTHROID, LEVOTHROID) 50 MCG tablet Take 50 mcg by mouth daily before breakfast.     . losartan (COZAAR) 25 MG tablet  Take 1 tablet (25 mg total) by mouth daily. 90 tablet 3  . Magnesium Cl-Calcium Carbonate (SLOW MAGNESIUM/CALCIUM PO) Take 1 tablet by mouth daily.    . metFORMIN (GLUCOPHAGE) 1000 MG tablet Take 500 mg by mouth 2 (two) times daily.    . nitroGLYCERIN (NITROSTAT) 0.4 MG SL tablet Place 1 tablet (0.4 mg total) under the tongue every 5 (five) minutes as needed for chest pain. 25 tablet 3  . pantoprazole (PROTONIX) 40 MG tablet Take 40 mg by mouth daily.    Marland Kitchen PARoxetine (PAXIL) 20 MG tablet Take 20 mg by mouth every morning.    . sitaGLIPtin (JANUVIA) 100 MG tablet Take 100 mg by mouth daily.    . valACYclovir (VALTREX) 1000 MG tablet Take 1,000 mg by mouth 2 (two) times daily as needed (for viral infection).     No current facility-administered medications for this visit.     Allergies  Allergen Reactions  . Codeine Nausea And Vomiting and Other (See Comments)    Severe stomach cramps  . Antihistamines, Diphenhydramine-Type Other (See Comments)    Causes hyperactivity  . Statins Other (See Comments)    Joint pains  . Erythromycin Hives and Rash    Due to dental work about 50 years ago. Was used in packing and resulted in rash/hives inside and outside of mouth.    Social History  Socioeconomic History  . Marital status: Married    Spouse name: Not on file  . Number of children: 2  . Years of education: Not on file  . Highest education level: Not on file  Social Needs  . Financial resource strain: Not on file  . Food insecurity - worry: Not on file  . Food insecurity - inability: Not on file  . Transportation needs - medical: Not on file  . Transportation needs - non-medical: Not on file  Occupational History  . Occupation: retired  Tobacco Use  . Smoking status: Former Smoker    Packs/day: 2.00    Years: 40.00    Pack years: 80.00    Last attempt to quit: 12/19/1997    Years since quitting: 18.9  . Smokeless tobacco: Never Used  Substance and Sexual Activity  . Alcohol  use: Yes    Alcohol/week: 3.6 oz    Types: 6 Glasses of wine per week    Comment: occasional  . Drug use: No  . Sexual activity: Not on file    Comment: menarch age 82, P3, HRT X 10 YRS, MENOPAUSE MID 40'S  Other Topics Concern  . Not on file  Social History Narrative   [name] age 44- [name] age 78  [place]     Review of Systems: General: negative for chills, fever, night sweats or weight changes.  Cardiovascular: negative for chest pain, dyspnea on exertion, edema, orthopnea, palpitations, paroxysmal nocturnal dyspnea or shortness of breath Dermatological: negative for rash Respiratory: negative for cough or wheezing Urologic: negative for hematuria Abdominal: negative for nausea, vomiting, diarrhea, bright red blood per rectum, melena, or hematemesis Neurologic: negative for visual changes, syncope, or dizziness All other systems reviewed and are otherwise negative except as noted above.    Blood pressure (!) 154/72, pulse (!) 107, height 5' 3.5" (1.613 m), weight 151 lb (68.5 kg), SpO2 97 %.  Affect appropriate Healthy:  appears stated age 26: normal Neck supple with no adenopathy JVP normal left  bruits no thyromegaly Lungs clear AS  murmur, no rub, gallop or click PMI normal Abdomen: benighn, BS positve, no tenderness, no AAA no bruit.  No HSM or HJR Distal pulses hard to palpate in feet  No edema Neuro non-focal Skin warm and dry No muscular weakness    ECG:  12/27/14 SR rate 91 normal  05/14/16  SR rate 94 normal   ASSESSMENT AND PLAN:   CAD S/P PCI 2007 RCA HSRA and Taxus stent in 2007 by Dr Lia Foyer.  Re look cath in 2008 OK, no Lt system disease and RCA patent  11/22/15 myovue normal ef 59% Continue medical Rx    Bruit:  New doppler criteria plaque no stenosis duplex 06/05/16 ASA f/u May 2020   Essential hypertension Losartan decreased 04/2016 stable low sodium diet   Elevated lipids On Welchol, statin intolerant- labs with primary  LFTls normal March 2018   Mild aortic stenosis Echo 06/13/15 mean gradient 16 mmHg f/u echo ordered   History of breast cancer Rt mastectomy Dec 2012 followed by chemo and radiation  Spinal stenosis of lumbar region Surgery Sept 2016 suspect her leg pain is from this  ABI's normal 06/13/15  GERD :  Continue protonix low carb diet finished with carafate   OB/GYN:  Post hysterectomy on 04/05/16  Recovering well no cardiac complications f/u Dr Julien Girt   Aortic Stenosis:  No change in murmur echo to assess gradients  PVD:  Pain in legs partly  neuropathy and arthritis but certainly may have PVD given disease in carotids And coronary arteries f/u ABI's and LE arterial duplex   F/u with me in 6 months echo for AS ordered as well as ABI's and LE arterial duplex    [name]

## 2016-11-30 ENCOUNTER — Ambulatory Visit (INDEPENDENT_AMBULATORY_CARE_PROVIDER_SITE_OTHER): Payer: Medicare Other | Admitting: Cardiovascular Disease

## 2016-11-30 ENCOUNTER — Encounter: Payer: Self-pay | Admitting: Cardiovascular Disease

## 2016-11-30 VITALS — BP 154/72 | HR 107 | Ht 63.5 in | Wt 151.0 lb

## 2016-11-30 DIAGNOSIS — E785 Hyperlipidemia, unspecified: Secondary | ICD-10-CM | POA: Diagnosis not present

## 2016-11-30 DIAGNOSIS — I739 Peripheral vascular disease, unspecified: Secondary | ICD-10-CM

## 2016-11-30 DIAGNOSIS — I350 Nonrheumatic aortic (valve) stenosis: Secondary | ICD-10-CM

## 2016-11-30 DIAGNOSIS — I6523 Occlusion and stenosis of bilateral carotid arteries: Secondary | ICD-10-CM | POA: Diagnosis not present

## 2016-11-30 DIAGNOSIS — I10 Essential (primary) hypertension: Secondary | ICD-10-CM

## 2016-11-30 NOTE — Patient Instructions (Addendum)
Medication Instructions:  Your physician recommends that you continue on your current medications as directed. Please refer to the Current Medication list given to you today.  Labwork: NONE  Testing/Procedures: Your physician has requested that you have an echocardiogram. Echocardiography is a painless test that uses sound waves to create images of your heart. It provides your doctor with information about the size and shape of your heart and how well your heart's chambers and valves are working. This procedure takes approximately one hour. There are no restrictions for this procedure.  Your physician has requested that you have a lower extremity arterial duplex with ABI's. This test is an ultrasound of the arteries in the legs. It looks at arterial blood flow in the legs. Allow one hour for Lower Arterial scans. There are no restrictions or special instructions  Follow-Up: Your physician wants you to follow-up in: 6 months with Dr. Johnsie Cancel. You will receive a reminder letter in the mail two months in advance. If you don't receive a letter, please call our office to schedule the follow-up appointment.   If you need a refill on your cardiac medications before your next appointment, please call your pharmacy.

## 2016-12-12 ENCOUNTER — Telehealth: Payer: Self-pay | Admitting: *Deleted

## 2016-12-12 ENCOUNTER — Other Ambulatory Visit: Payer: Self-pay

## 2016-12-12 ENCOUNTER — Ambulatory Visit (HOSPITAL_COMMUNITY): Payer: Medicare Other | Attending: Internal Medicine

## 2016-12-12 DIAGNOSIS — E785 Hyperlipidemia, unspecified: Secondary | ICD-10-CM | POA: Insufficient documentation

## 2016-12-12 DIAGNOSIS — I10 Essential (primary) hypertension: Secondary | ICD-10-CM | POA: Diagnosis not present

## 2016-12-12 DIAGNOSIS — I350 Nonrheumatic aortic (valve) stenosis: Secondary | ICD-10-CM | POA: Diagnosis not present

## 2016-12-12 DIAGNOSIS — I2510 Atherosclerotic heart disease of native coronary artery without angina pectoris: Secondary | ICD-10-CM | POA: Insufficient documentation

## 2016-12-12 NOTE — Telephone Encounter (Signed)
Pt has been notified of echo results by phone with verbal understanding. Pt thanked me for my call. Pt agreeable to repeat echo in 1 yr.

## 2016-12-12 NOTE — Telephone Encounter (Signed)
-----   Message from Josue Hector, MD sent at 12/12/2016  2:14 PM EST ----- EF normal moderate AS f/u echo in a year

## 2016-12-24 ENCOUNTER — Ambulatory Visit
Admission: RE | Admit: 2016-12-24 | Discharge: 2016-12-24 | Disposition: A | Discharge status: Home / Self Care | Payer: Medicare Other | Source: Ambulatory Visit | Attending: Obstetrics and Gynecology | Admitting: Obstetrics and Gynecology

## 2016-12-24 DIAGNOSIS — Z853 Personal history of malignant neoplasm of breast: Secondary | ICD-10-CM

## 2016-12-24 DIAGNOSIS — N644 Mastodynia: Secondary | ICD-10-CM

## 2016-12-24 HISTORY — DX: Personal history of antineoplastic chemotherapy: Z92.21

## 2016-12-24 HISTORY — DX: Personal history of irradiation: Z92.3

## 2016-12-26 ENCOUNTER — Other Ambulatory Visit: Payer: Self-pay | Admitting: Cardiovascular Disease

## 2016-12-26 DIAGNOSIS — I739 Peripheral vascular disease, unspecified: Secondary | ICD-10-CM

## 2016-12-31 ENCOUNTER — Inpatient Hospital Stay (HOSPITAL_COMMUNITY)
Admission: RE | Admit: 2016-12-31 | Payer: Medicare Other | Source: Ambulatory Visit | Attending: Cardiovascular Disease | Admitting: Cardiovascular Disease

## 2017-01-16 ENCOUNTER — Telehealth: Payer: Self-pay | Admitting: Family Medicine

## 2017-01-16 NOTE — Telephone Encounter (Signed)
Copied from [place] [phone] [phone]. Topic: Inquiry >> Jan 14, 2017 12:45 PM [name] wrote: Reason for CRM:PT called to find out of her medical records have been received from her pervious Dr.'s office  >> Jan 16, 2017  8:22 AM [name] wrote: Sent to wrong office.  >> Jan 16, 2017  2:48 PM [name], NT wrote: PT called to find out of her medical records have been received from her pervious Dr.'s office   Pt. Also calling to let office know she hasn't received any new. Pt. Paperwork sent out yet. Pt. Is scheduled for feb. With Dr. Gillermo Murdoch

## 2017-01-17 ENCOUNTER — Ambulatory Visit (HOSPITAL_COMMUNITY)
Admission: RE | Admit: 2017-01-17 | Discharge: 2017-01-17 | Disposition: A | Discharge status: Home / Self Care | Payer: Medicare Other | Source: Ambulatory Visit | Attending: Cardiology | Admitting: Cardiology

## 2017-01-17 DIAGNOSIS — I2510 Atherosclerotic heart disease of native coronary artery without angina pectoris: Secondary | ICD-10-CM | POA: Insufficient documentation

## 2017-01-17 DIAGNOSIS — I739 Peripheral vascular disease, unspecified: Secondary | ICD-10-CM | POA: Diagnosis not present

## 2017-01-17 DIAGNOSIS — E785 Hyperlipidemia, unspecified: Secondary | ICD-10-CM | POA: Diagnosis not present

## 2017-01-17 DIAGNOSIS — I10 Essential (primary) hypertension: Secondary | ICD-10-CM | POA: Diagnosis not present

## 2017-01-17 DIAGNOSIS — Z87891 Personal history of nicotine dependence: Secondary | ICD-10-CM | POA: Diagnosis not present

## 2017-01-25 NOTE — Telephone Encounter (Signed)
°  Relation to pt: self  Call back number: [phone] (H)   Reason for call:  Patient checking on the status, informed patient records were received. Patient would like new patient  Package mailed to home. In addition verified demographics information.

## 2017-02-07 ENCOUNTER — Encounter: Payer: Self-pay | Admitting: Family Medicine

## 2017-02-18 ENCOUNTER — Encounter: Payer: Self-pay | Admitting: Family Medicine

## 2017-02-18 LAB — HM DIABETES EYE EXAM

## 2017-03-04 ENCOUNTER — Telehealth: Payer: Self-pay

## 2017-03-04 ENCOUNTER — Encounter: Payer: Self-pay | Admitting: Family Medicine

## 2017-03-04 ENCOUNTER — Other Ambulatory Visit: Payer: Self-pay | Admitting: Family Medicine

## 2017-03-04 ENCOUNTER — Ambulatory Visit (INDEPENDENT_AMBULATORY_CARE_PROVIDER_SITE_OTHER): Payer: Medicare Other | Admitting: Family Medicine

## 2017-03-04 VITALS — BP 132/76 | HR 93 | Temp 97.9°F | Ht 63.5 in | Wt 148.0 lb

## 2017-03-04 DIAGNOSIS — E7800 Pure hypercholesterolemia, unspecified: Secondary | ICD-10-CM | POA: Diagnosis not present

## 2017-03-04 DIAGNOSIS — E118 Type 2 diabetes mellitus with unspecified complications: Secondary | ICD-10-CM | POA: Diagnosis not present

## 2017-03-04 DIAGNOSIS — E875 Hyperkalemia: Secondary | ICD-10-CM

## 2017-03-04 DIAGNOSIS — R300 Dysuria: Secondary | ICD-10-CM | POA: Diagnosis not present

## 2017-03-04 LAB — URINALYSIS, ROUTINE W REFLEX MICROSCOPIC
Bilirubin Urine: NEGATIVE
Hgb urine dipstick: NEGATIVE
KETONES UR: NEGATIVE
Nitrite: POSITIVE — AB
RBC / HPF: NONE SEEN (ref 0–?)
SPECIFIC GRAVITY, URINE: 1.02 (ref 1.000–1.030)
Total Protein, Urine: NEGATIVE
Urine Glucose: NEGATIVE
Urobilinogen, UA: 0.2 (ref 0.0–1.0)
pH: 5.5 (ref 5.0–8.0)

## 2017-03-04 LAB — LIPID PANEL
CHOL/HDL RATIO: 6
CHOLESTEROL: 161 mg/dL (ref 0–200)
HDL: 27.7 mg/dL — AB (ref >39.00)
NonHDL: 133.14
TRIGLYCERIDES: 208 mg/dL — AB (ref 0.0–149.0)
VLDL: 41.6 mg/dL — AB (ref 0.0–40.0)

## 2017-03-04 LAB — COMPREHENSIVE METABOLIC PANEL
ALBUMIN: 3.9 g/dL (ref 3.5–5.2)
ALT: 20 U/L (ref 0–35)
AST: 23 U/L (ref 0–37)
Alkaline Phosphatase: 68 U/L (ref 39–117)
BUN: 18 mg/dL (ref 6–23)
CALCIUM: 9.2 mg/dL (ref 8.4–10.5)
CHLORIDE: 106 meq/L (ref 96–112)
CO2: 24 mEq/L (ref 19–32)
Creatinine, Ser: 1.39 mg/dL — ABNORMAL HIGH (ref 0.40–1.20)
GFR: 39.36 mL/min — ABNORMAL LOW (ref >60.00)
Glucose, Bld: 122 mg/dL — ABNORMAL HIGH (ref 70–99)
POTASSIUM: 5.7 meq/L — AB (ref 3.5–5.1)
Sodium: 141 mEq/L (ref 135–145)
Total Bilirubin: 0.3 mg/dL (ref 0.2–1.2)
Total Protein: 6.7 g/dL (ref 6.0–8.3)

## 2017-03-04 LAB — LDL CHOLESTEROL, DIRECT: Direct LDL: 102 mg/dL

## 2017-03-04 LAB — HEMOGLOBIN A1C: Hgb A1c MFr Bld: 7.8 % — ABNORMAL HIGH (ref 4.6–6.5)

## 2017-03-04 MED ORDER — SODIUM POLYSTYRENE SULFONATE PO POWD: ORAL | 0 refills | Status: DC

## 2017-03-04 MED ORDER — LANCET DEVICE MISC: 100.0000 | Freq: Every day | 99 refills | Status: DC

## 2017-03-04 MED ORDER — CIPROFLOXACIN HCL 250 MG PO TABS: 250.0000 mg | ORAL_TABLET | Freq: Two times a day (BID) | ORAL | 0 refills | Status: DC

## 2017-03-04 MED ORDER — METFORMIN HCL 1000 MG PO TABS: 500.0000 mg | ORAL_TABLET | Freq: Two times a day (BID) | ORAL | 1 refills | Status: DC

## 2017-03-04 MED ORDER — VALACYCLOVIR HCL 1 G PO TABS: 1000.0000 mg | ORAL_TABLET | Freq: Two times a day (BID) | ORAL | 1 refills | Status: DC | PRN

## 2017-03-04 MED ORDER — GLUCOSE BLOOD VI STRP: 100.0000 | ORAL_STRIP | 99 refills | Status: DC | PRN

## 2017-03-04 MED ORDER — METFORMIN HCL 1000 MG PO TABS: ORAL_TABLET | ORAL | 1 refills | Status: DC

## 2017-03-04 NOTE — Assessment & Plan Note (Signed)
Continue current rx for now.  Check a1c. Discussed possibly starting long acting insulin if she wants to dc januvia and a1c is elevated. She said she would consider this. >25 minutes spent in face to face time with patient, >50% spent in counselling or coordination of care

## 2017-03-04 NOTE — Telephone Encounter (Signed)
I called and LMOVM stating that her K is high and an Rx was sent to pharmacy that she needs to take and we needs to have her come in to have her lab drawn (BMET) on Wed or Thurs to make sure that it is coming down  Also stated that she does have a UTI and an abx was sent in as well and to please pick that up and start that as well  I asked that she please RTC and we would go into more details because her sugar was elevated as well/and also schedule the lab visit for Wed or Thurs/future order created/thx dmf

## 2017-03-04 NOTE — Patient Instructions (Signed)
Great to meet you.  I will call you with your lab results from today and you can view them online.

## 2017-03-04 NOTE — Progress Notes (Signed)
Subjective:   Patient ID: [name], female    DOB: 1941/01/11, 77 y.o.   MRN: [id number]  [name] is a pleasant 77 y.o. year old female who presents to clinic today with New Patient (Initial Visit) (Patient is here today as a new pt.) and Diabetes (She is here for a 3 month check for her DM.  Her last eye exam was done 2-3 weeks ago by Alois Cliche. She had Dexa & PAP 12.03.2018 at [facility]. She is currently fasting.)  on 03/04/2017  HPI:  Diabetes-  Does not check FSBS regularly.  Currently taking Metformin 1000 every morning,500 mg at lunch, and 1000 mg at bedtime,, Januvia 100 mg daily and glipizide 2.5 mg daily.  Thinks Januvia may be causing some hand pain.  Stopped taking it for awhile and her hand pain improved.  She is now taking it again.  Lab Results  Component Value Date   HGBA1C 7.4 (H) 10/05/2015     Current Outpatient Medications on File Prior to Visit  Medication Sig Dispense Refill  . aspirin 81 MG tablet Take 1 tablet (81 mg total) by mouth daily. Resume 4 days post-op    . colesevelam (WELCHOL) 625 MG tablet Take 2,500 mg by mouth daily.    Marland Kitchen glipiZIDE (GLUCOTROL XL) 2.5 MG 24 hr tablet Take 2.5 mg by mouth daily with breakfast.    . ibuprofen (ADVIL,MOTRIN) 200 MG tablet Take 1 tablet (200 mg total) by mouth every 6 (six) hours as needed for mild pain or moderate pain. Resume 5 days post-op as needed (Patient taking differently: Take 400 mg by mouth every 6 (six) hours as needed for mild pain or moderate pain. Resume 5 days post-op as needed)  0  . levothyroxine (SYNTHROID, LEVOTHROID) 75 MCG tablet Take 75 mcg by mouth daily.    Marland Kitchen losartan (COZAAR) 25 MG tablet Take 1 tablet (25 mg total) by mouth daily. 90 tablet 3  . Magnesium Cl-Calcium Carbonate (SLOW MAGNESIUM/CALCIUM PO) Take 1 tablet by mouth daily.    . nitroGLYCERIN (NITROSTAT) 0.4 MG SL tablet Place 1 tablet (0.4 mg total) under the tongue every 5 (five) minutes as needed for  chest pain. 25 tablet 3  . pantoprazole (PROTONIX) 40 MG tablet Take 40 mg by mouth daily.    Marland Kitchen PARoxetine (PAXIL) 20 MG tablet Take 20 mg by mouth every morning.    . sitaGLIPtin (JANUVIA) 100 MG tablet Take 100 mg by mouth daily.     No current facility-administered medications on file prior to visit.     Allergies  Allergen Reactions  . Codeine Nausea And Vomiting and Other (See Comments)    Severe stomach cramps  . Antihistamines, Diphenhydramine-Type Other (See Comments)    Causes hyperactivity  . Statins Other (See Comments)    Joint pains  . Erythromycin Hives and Rash    Due to dental work about 50 years ago. Was used in packing and resulted in rash/hives inside and outside of mouth.    Past Medical History:  Diagnosis Date  . Anemia    childhood  . Anxiety   . Arterial stenosis (HCC)    mesenteric  . Arthritis   . Breast cancer ([facility]) 12/05/10   R breast, inv mammary, in situ,ER/PR +,HER2 -  . Cancer ([facility])   . Carcinoma of breast treated with adjuvant chemotherapy ([facility])   . Coronary artery disease    stent 2007  . Depression   . Diabetes mellitus   .  Difficulty sleeping   . Diverticulosis 12/10/03  . Elevated cholesterol   . Generalized weakness   . GERD (gastroesophageal reflux disease)   . Heart murmur    secondary to chem. 2013  . Hepatitis    as an infant  . HSV-1 (herpes simplex virus 1) infection   . Hyperlipidemia   . Hypertension   . Hypothyroidism   . Osteoarthritis   . Personal history of chemotherapy   . Personal history of radiation therapy   . Pneumonia    2014 september  . 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 Spinal stenosis   . Thyroid disease     Past Surgical History:  Procedure Laterality Date  . ANTERIOR AND POSTERIOR REPAIR N/A 04/05/2016   Procedure: ANTERIOR (CYSTOCELE) AND POSTERIOR REPAIR (RECTOCELE);  Surgeon: Gretchen Adkins, MD;  Location: [facility];  Service: Gynecology;  Laterality: N/A;  . BREAST  LUMPECTOMY  1983   benign biopsy  . BREAST LUMPECTOMY Right 12/29/2010   snbx, ER/PR +, Her2 -, 0/1 node pos.  . CAROTID STENT  2007  . CARPAL TUNNEL RELEASE Bilateral 9/12,8,12  . CHOLECYSTECTOMY    . COLONOSCOPY N/A 02/09/2013   Procedure: COLONOSCOPY;  Surgeon: Dora M Brodie, MD;  Location: [facility];  Service: Endoscopy;  Laterality: N/A;  . CORONARY ANGIOPLASTY    . DILATION AND CURETTAGE OF UTERUS    . ESOPHAGOGASTRODUODENOSCOPY N/A 02/09/2013   Procedure: ESOPHAGOGASTRODUODENOSCOPY (EGD);  Surgeon: Dora M Brodie, MD;  Location: [facility];  Service: Endoscopy;  Laterality: N/A;  . FOOT SPURS    . HYSTEROSCOPY W/D&C N/A 09/11/2012   Procedure: DILATATION AND CURETTAGE ;  Surgeon: Gretchen Adkins, MD;  Location: [facility];  Service: Gynecology;  Laterality: N/A;  . KNEE SURGERY  2007  . LAPAROSCOPIC ASSISTED VAGINAL HYSTERECTOMY N/A 04/05/2016   Procedure: LAPAROSCOPIC ASSISTED VAGINAL HYSTERECTOMY possible BSO;  Surgeon: Gretchen Adkins, MD;  Location: [facility];  Service: Gynecology;  Laterality: N/A;  . LUMBAR LAMINECTOMY/DECOMPRESSION MICRODISCECTOMY N/A 10/20/2014   Procedure: MICRO LUMBER DECOMPRESSION L3-4 L4-5;  Surgeon: Jeffrey Beane, MD;  Location: [facility];  Service: Orthopedics;  Laterality: N/A;  . LUMBAR LAMINECTOMY/DECOMPRESSION MICRODISCECTOMY Bilateral 10/13/2015   Procedure: MICRO LUMBAR DECOMPRESSION L5 - S1 AND REDO DECOMPRESSION L4 - L5 AND REMOVAL OF FACET CYST L4 - L5 2 LEVELS;  Surgeon: Jeffrey Beane, MD;  Location: [facility];  Service: Orthopedics;  Laterality: Bilateral;  . PORT-A-CATH REMOVAL  04/17/2011   Procedure: REMOVAL PORT-A-CATH;  Surgeon: Matthew Wakefield, MD;  Location[facility];  Service: General;  Laterality: Left;  . PORTACATH PLACEMENT  02/07/2011   Procedure: INSERTION PORT-A-CATH;  Surgeon: Matthew Wakefield, MD;  Location: [facility];  Service: General;  Laterality: N/A;    Family History  Problem Relation Age of Onset  . Throat cancer  Father   . Alcoholism Father   . Heart attack Father   . Heart disease Mother   . Lymphoma Brother 60  . Cancer Son   . Heart attack Brother   . Diabetes Brother   . Hypertension Brother   . Colon cancer Neg Hx   . Stroke Neg Hx     Social History   Socioeconomic History  . Marital status: Married    Spouse name: Not on file  . Number of children: 2  . Years of education: Not on file  . Highest education level: Not on file  Social Needs  . Financial resource strain: Not on file  .   Food insecurity - worry: Not on file  . Food insecurity - inability: Not on file  . Transportation needs - medical: Not on file  . Transportation needs - non-medical: Not on file  Occupational History  . Occupation: retired  Tobacco Use  . Smoking status: Former Smoker    Packs/day: 2.00    Years: 40.00    Pack years: 80.00    Last attempt to quit: 12/19/1997    Years since quitting: 19.2  . Smokeless tobacco: Never Used  Substance and Sexual Activity  . Alcohol use: Yes    Alcohol/week: 3.6 oz    Types: 6 Glasses of wine per week    Comment: occasional  . Drug use: No  . Sexual activity: Not on file    Comment: menarch age 23, P3, HRT X 10 YRS, MENOPAUSE MID 40'S  Other Topics Concern  . Not on file  Social History Narrative   [name] age 88- [place] age 49  [place]   The PMH, [place], Social History, Family History, Medications, and allergies have been reviewed in [facility], and have been updated if relevant.   Review of Systems  Constitutional: Negative.   HENT: Negative.   Eyes: Negative.   Respiratory: Negative.   Cardiovascular: Negative.   Gastrointestinal: Negative.   Musculoskeletal: Positive for arthralgias.  Neurological: Negative.   Hematological: Negative.   Psychiatric/Behavioral: Negative.   All other systems reviewed and are negative.      Objective:    BP 132/76 (BP Location: Left Arm, Patient Position: Sitting, Cuff Size: Normal)   Pulse 93    Temp 97.9 F (36.6 C) (Oral)   Ht 5' 3.5" (1.613 m)   Wt 148 lb (67.1 kg)   SpO2 96%   BMI 25.81 kg/m    Physical Exam   General:  Well-developed,well-nourished,in no acute distress; alert,appropriate and cooperative throughout examination Head:  normocephalic and atraumatic.   Eyes:  vision grossly intact, PERRL Ears:  R ear normal and L ear normal externally, TMs clear bilaterally Nose:  no external deformity.   Mouth:  good dentition.   Neck:  No deformities, masses, or tenderness noted. Lungs:  Normal respiratory effort, chest expands symmetrically. Lungs are clear to auscultation, no crackles or wheezes. Heart:  Normal rate and regular rhythm. S1 and S2 normal without gallop, murmur, click, rub or other extra sounds. Msk:  No deformity or scoliosis noted of thoracic or lumbar spine.   Extremities:  No clubbing, cyanosis, edema, or deformity noted with normal full range of motion of all joints.   Neurologic:  alert & oriented X3 and gait normal.   Skin:  Intact without suspicious lesions or rashes Psych:  Cognition and judgment appear intact. Alert and cooperative with normal attention span and concentration. No apparent delusions, illusions, hallucinations       Assessment & Plan:   Type 2 diabetes mellitus with complication, without long-term current use of insulin (HCC) - Plan: Hemoglobin A1c, Comprehensive metabolic panel, Lipid panel  HYPERCHOLESTEROLEMIA  Dysuria - Plan: Urinalysis, Routine w reflex microscopic No Follow-up on file.

## 2017-03-04 NOTE — Telephone Encounter (Signed)
-----   Message from Lucille Passy, MD sent at 03/04/2017  1:13 PM EST ----- Please call pt-  #1- potassium is very high. I am calling in rx for kayexalate 15 grams po x 1, no refills.  Please schedule repeat BMET on Wednesday or Thursday for hyperkalemia.  Avoid foods high in potassium- bananas, melons, dried fruits.  #2- she does have a UTI.  eRx sent for cipro to her pharmacy.  #3- a1c is a bit high- if she feels she can cut back in carbohydrates and sugars, please send her a copy of the eat right diet and let's have her come see me in 3 months to recheck a1c at that time and change rxs if necessary.

## 2017-03-05 NOTE — Telephone Encounter (Signed)
TA-I just spoke with [name] said that the pharmacy will have the Kayexalate this afternoon and she will pick it up then/she has scheduled a lab visit for Friday at 10am because her husband is in the hospital and she cannot get here before then/is this day ok to recheck K? Plz advise/thx dmf   If not I am to call her cell at [phone]

## 2017-03-05 NOTE — Telephone Encounter (Signed)
Yes that is great. Thank you.

## 2017-03-08 ENCOUNTER — Other Ambulatory Visit: Payer: Medicare Other

## 2017-03-08 ENCOUNTER — Other Ambulatory Visit (INDEPENDENT_AMBULATORY_CARE_PROVIDER_SITE_OTHER): Payer: Medicare Other

## 2017-03-08 DIAGNOSIS — E875 Hyperkalemia: Secondary | ICD-10-CM | POA: Diagnosis not present

## 2017-03-08 LAB — BASIC METABOLIC PANEL
BUN: 20 mg/dL (ref 6–23)
CO2: 24 mEq/L (ref 19–32)
CREATININE: 1.45 mg/dL — AB (ref 0.40–1.20)
Calcium: 8.7 mg/dL (ref 8.4–10.5)
Chloride: 105 mEq/L (ref 96–112)
GFR: 37.49 mL/min — AB (ref >60.00)
Glucose, Bld: 263 mg/dL — ABNORMAL HIGH (ref 70–99)
Potassium: 4.9 mEq/L (ref 3.5–5.1)
Sodium: 141 mEq/L (ref 135–145)

## 2017-03-11 ENCOUNTER — Telehealth: Payer: Self-pay | Admitting: Family Medicine

## 2017-03-11 NOTE — Telephone Encounter (Signed)
Pt aware of lab results/thx dmf

## 2017-03-11 NOTE — Telephone Encounter (Signed)
Copied from [place] [phone]. Topic: Quick Communication - See Telephone Encounter >> Mar 11, 2017  9:38 AM Boyd Kerbs wrote: CRM for notification. See Telephone encounter for:   Pt. Calling about results, she said was told she would hear something on Friday.  03/11/17.

## 2017-03-18 ENCOUNTER — Other Ambulatory Visit: Payer: Self-pay | Admitting: Family Medicine

## 2017-03-20 ENCOUNTER — Telehealth: Payer: Self-pay | Admitting: Family Medicine

## 2017-03-20 NOTE — Telephone Encounter (Signed)
Copied from [place] [phone]. Topic: Quick Communication - Rx Refill/Question >> Mar 20, 2017  2:33 PM Lolita Rieger, [place] wrote: Medication: paxil 20 mg   Has the patient contacted their pharmacy? yes   (Agent: If no, request that the patient contact the pharmacy for the refill.)   Preferred Pharmacy (with phone number or street name): [facility]  on [address] [phone]   Agent: Please be advised that RX refills may take up to 3 business days. We ask that you follow-up with your pharmacy.

## 2017-03-21 MED ORDER — PAROXETINE HCL 20 MG PO TABS: 20.0000 mg | ORAL_TABLET | ORAL | 0 refills | Status: DC

## 2017-03-26 ENCOUNTER — Telehealth: Payer: Self-pay | Admitting: Family Medicine

## 2017-03-26 NOTE — Telephone Encounter (Signed)
Copied from [place]. Topic: Quick Communication - Rx Refill/Question >> Mar 25, 2017 10:16 AM Neva Seat wrote: Test Strips for Accu - Check Guide BS monitor  Insurance company sent a new BS testing monitor - needing the strips -Accu-check guide  [id number] [place], [place] [address] [place] [address] Phone: [phone] Fax: [phone]

## 2017-03-27 ENCOUNTER — Other Ambulatory Visit: Payer: Self-pay

## 2017-03-27 MED ORDER — GLUCOSE BLOOD VI STRP: ORAL_STRIP | 12 refills | Status: DC

## 2017-03-27 NOTE — Progress Notes (Signed)
Strips sent in/thx dmf

## 2017-03-28 NOTE — Telephone Encounter (Signed)
Pt following up on request for new test strips, due to insurance company sending new meter.  Accu check guide.  Pt test 3 X a week.   [id number] [address], [place] [address] [phone] (Phone) [phone] (Fax)

## 2017-03-29 ENCOUNTER — Other Ambulatory Visit: Payer: Self-pay | Admitting: Emergency Medicine

## 2017-03-29 NOTE — Telephone Encounter (Signed)
Patient strips have been sent to pharmacy. Nothing further needed.

## 2017-04-19 ENCOUNTER — Other Ambulatory Visit: Payer: Self-pay | Admitting: Specialist

## 2017-04-19 DIAGNOSIS — E278 Other specified disorders of adrenal gland: Secondary | ICD-10-CM

## 2017-04-22 ENCOUNTER — Other Ambulatory Visit: Payer: Self-pay | Admitting: Family Medicine

## 2017-04-24 ENCOUNTER — Ambulatory Visit
Admission: RE | Admit: 2017-04-24 | Discharge: 2017-04-24 | Disposition: A | Discharge status: Home / Self Care | Payer: Medicare Other | Source: Ambulatory Visit | Attending: Specialist | Admitting: Specialist

## 2017-04-24 DIAGNOSIS — E278 Other specified disorders of adrenal gland: Secondary | ICD-10-CM

## 2017-04-29 ENCOUNTER — Other Ambulatory Visit: Payer: Self-pay | Admitting: Cardiovascular Disease

## 2017-04-29 ENCOUNTER — Ambulatory Visit
Admission: RE | Admit: 2017-04-29 | Discharge: 2017-04-29 | Disposition: A | Discharge status: Home / Self Care | Payer: Self-pay | Source: Ambulatory Visit | Attending: Specialist | Admitting: Specialist

## 2017-04-29 ENCOUNTER — Other Ambulatory Visit: Payer: Self-pay | Admitting: Specialist

## 2017-04-29 DIAGNOSIS — E278 Other specified disorders of adrenal gland: Secondary | ICD-10-CM

## 2017-04-29 DIAGNOSIS — E279 Disorder of adrenal gland, unspecified: Secondary | ICD-10-CM

## 2017-05-01 ENCOUNTER — Other Ambulatory Visit: Payer: Medicare Other

## 2017-05-05 ENCOUNTER — Other Ambulatory Visit: Payer: Self-pay | Admitting: Cardiovascular Disease

## 2017-05-07 NOTE — Telephone Encounter (Signed)
Outpatient Medication Detail    Disp Refills Start End   losartan (COZAAR) 25 MG tablet 90 tablet 0 05/01/2017    Sig: TAKE 1 TABLET BY MOUTH ONCE DAILY   Sent to pharmacy as: losartan (COZAAR) 25 MG tablet   Notes to Pharmacy: Pt is due for her yearly appt. Please have her call to schedule   E-Prescribing Status: Receipt confirmed by pharmacy (05/01/2017 8:46 AM EDT)   Pharmacy   [id number] - [place], [place]

## 2017-05-14 ENCOUNTER — Ambulatory Visit (INDEPENDENT_AMBULATORY_CARE_PROVIDER_SITE_OTHER): Payer: Medicare Other | Admitting: Nurse Practitioner

## 2017-05-14 ENCOUNTER — Encounter: Payer: Self-pay | Admitting: Nurse Practitioner

## 2017-05-14 VITALS — BP 128/68 | HR 112 | Temp 97.6°F | Ht 63.5 in | Wt 148.2 lb

## 2017-05-14 DIAGNOSIS — J00 Acute nasopharyngitis [common cold]: Secondary | ICD-10-CM | POA: Diagnosis not present

## 2017-05-14 DIAGNOSIS — J9801 Acute bronchospasm: Secondary | ICD-10-CM

## 2017-05-14 MED ORDER — DM-GUAIFENESIN ER 30-600 MG PO TB12: 1.0000 | ORAL_TABLET | Freq: Two times a day (BID) | ORAL | 0 refills | Status: DC | PRN

## 2017-05-14 MED ORDER — FLUTICASONE PROPIONATE 50 MCG/ACT NA SUSP
2.0000 | Freq: Every day | NASAL | 0 refills | Status: DC
Start: 2017-05-14 — End: 2017-06-19

## 2017-05-14 MED ORDER — BENZONATATE 100 MG PO CAPS: 100.0000 mg | ORAL_CAPSULE | Freq: Three times a day (TID) | ORAL | 0 refills | Status: DC | PRN

## 2017-05-14 MED ORDER — ALBUTEROL SULFATE (2.5 MG/3ML) 0.083% IN NEBU: 2.5000 mg | INHALATION_SOLUTION | Freq: Once | RESPIRATORY_TRACT | Status: DC

## 2017-05-14 MED ORDER — ALBUTEROL SULFATE HFA 108 (90 BASE) MCG/ACT IN AERS: 1.0000 | INHALATION_SPRAY | Freq: Four times a day (QID) | RESPIRATORY_TRACT | 0 refills | Status: DC | PRN

## 2017-05-14 NOTE — Progress Notes (Signed)
Subjective:  Patient ID: [name], female    DOB: 1940/11/02  Age: 77 y.o. MRN: [id number]  CC: Cough (cough started 2 days ago, producing yellow phlegm.OTC didnt help much.)  Cough  This is a new problem. The current episode started yesterday. The problem has been unchanged. The cough is productive of purulent sputum. Associated symptoms include chest pain, nasal congestion, postnasal drip, rhinorrhea, a sore throat, shortness of breath and wheezing. Pertinent negatives include no chills, ear congestion, ear pain, fever, headaches, heartburn, hemoptysis or myalgias. The symptoms are aggravated by lying down and cold air. She has tried OTC cough suppressant for the symptoms. The treatment provided no relief.    Outpatient Medications Prior to Visit  Medication Sig Dispense Refill  . aspirin 81 MG tablet Take 1 tablet (81 mg total) by mouth daily. Resume 4 days post-op    . Aspirin Buf,CaCarb-MgCarb-MgO, (BUFFERIN LOW DOSE) 81 MG TABS 81 mg once daily.    . colesevelam (WELCHOL) 625 MG tablet Take 2,500 mg by mouth daily.    . DOCOSAHEXAENOIC ACID PO 1,200 mg Two (2) times a day.    Marland Kitchen glipiZIDE (GLUCOTROL XL) 2.5 MG 24 hr tablet Take 2.5 mg by mouth daily with breakfast.    . glucose blood (ACCU-CHEK GUIDE) test strip Use as instructed 100 each 12  . ibuprofen (ADVIL,MOTRIN) 200 MG tablet Take 1 tablet (200 mg total) by mouth every 6 (six) hours as needed for mild pain or moderate pain. Resume 5 days post-op as needed (Patient taking differently: Take 400 mg by mouth every 6 (six) hours as needed for mild pain or moderate pain. Resume 5 days post-op as needed)  0  . Lancet Device MISC 100 each by Does not apply route daily. UAD for daily glucose monitoring 100 each PRN  . Lancets Misc. (ACCU-CHEK MULTICLIX LANCET DEV) KIT Use as directed to check sugars    . levothyroxine (SYNTHROID, LEVOTHROID) 75 MCG tablet Take 75 mcg by mouth daily.    Derrill Memo ON 07/31/2017] losartan (COZAAR) 25 MG  tablet TAKE 1 TABLET BY MOUTH EVERY DAY(PT DUE TO SCHEDULE YEARLY APPOINTMENT) 90 tablet 1  . metFORMIN (GLUCOPHAGE) 1000 MG tablet 1 tab by mouth every morning, 1/2 tab by mouth at lunch, 1 tab by mouth at bedtime 180 tablet 1  . nitroGLYCERIN (NITROSTAT) 0.4 MG SL tablet Place 1 tablet (0.4 mg total) under the tongue every 5 (five) minutes as needed for chest pain. 25 tablet 3  . pantoprazole (PROTONIX) 40 MG tablet Take 40 mg by mouth daily.    Marland Kitchen PARoxetine (PAXIL) 20 MG tablet Take 1 tablet (20 mg total) by mouth every morning. 90 tablet 0  . sitaGLIPtin (JANUVIA) 100 MG tablet Take 100 mg by mouth daily.    . sodium polystyrene (KAYEXALATE) powder 15 grams po x 1 15 g 0  . valACYclovir (VALTREX) 1000 MG tablet TAKE 1 TABLET BY MOUTH 2  TIMES DAILY AS NEEDED FOR  VIRAL INFECTION. 180 tablet 0  . albuterol (PROVENTIL HFA;VENTOLIN HFA) 108 (90 Base) MCG/ACT inhaler Inhale into the lungs.    . ciprofloxacin (CIPRO) 250 MG tablet Take 1 tablet (250 mg total) by mouth 2 (two) times daily. (Patient not taking: Reported on 05/14/2017) 10 tablet 0  . Magnesium Cl-Calcium Carbonate (SLOW MAGNESIUM/CALCIUM PO) Take 1 tablet by mouth daily.     No facility-administered medications prior to visit.     ROS See HPI  Objective:  BP 128/68 (BP Location: Left  Arm, Patient Position: Sitting, Cuff Size: Normal)   Pulse (!) 112   Temp 97.6 F (36.4 C) (Oral)   Ht 5' 3.5" (1.613 m)   Wt 148 lb 3.2 oz (67.2 kg)   SpO2 97%   BMI 25.84 kg/m   BP Readings from Last 3 Encounters:  05/14/17 128/68  03/04/17 132/76  11/30/16 (!) 154/72    Wt Readings from Last 3 Encounters:  05/14/17 148 lb 3.2 oz (67.2 kg)  03/04/17 148 lb (67.1 kg)  11/30/16 151 lb (68.5 kg)    Physical Exam  HENT:  Right Ear: Tympanic membrane, external ear and ear canal normal. No middle ear effusion.  Left Ear: Tympanic membrane, external ear and ear canal normal.  No middle ear effusion.  Nose: Mucosal edema present. No  rhinorrhea. Right sinus exhibits no maxillary sinus tenderness and no frontal sinus tenderness. Left sinus exhibits no maxillary sinus tenderness and no frontal sinus tenderness.  Mouth/Throat: Uvula is midline. Posterior oropharyngeal erythema present. No oropharyngeal exudate.  Cardiovascular: Normal rate.  Murmur heard. Pulmonary/Chest: No stridor. No respiratory distress. She has wheezes. She has no rales.  Clear lung sounds after nebulizer treatment.  Lymphadenopathy:    She has no cervical adenopathy.  Vitals reviewed.   Lab Results  Component Value Date   WBC 10.3 04/06/2016   HGB 9.9 (L) 04/06/2016   HCT 29.3 (L) 04/06/2016   PLT 226 04/06/2016   GLUCOSE 263 (H) 03/08/2017   CHOL 161 03/04/2017   TRIG 208.0 (H) 03/04/2017   HDL 27.70 (L) 03/04/2017   LDLDIRECT 102.0 03/04/2017   LDLCALC 83 05/13/2008   ALT 20 03/04/2017   AST 23 03/04/2017   NA 141 03/08/2017   K 4.9 03/08/2017   CL 105 03/08/2017   CREATININE 1.45 (H) 03/08/2017   BUN 20 03/08/2017   CO2 24 03/08/2017   TSH 3.43 04/15/2013   INR 0.88 09/10/2015   HGBA1C 7.8 (H) 03/04/2017   MICROALBUR 1.0 05/13/2008    Assessment & Plan:   Lakyn was seen today for cough.  Diagnoses and all orders for this visit:  Acute nasopharyngitis -     benzonatate (TESSALON) 100 MG capsule; Take 1 capsule (100 mg total) by mouth 3 (three) times daily as needed for cough. -     dextromethorphan-guaiFENesin (MUCINEX DM) 30-600 MG 12hr tablet; Take 1 tablet by mouth 2 (two) times daily as needed for cough. -     fluticasone (FLONASE) 50 MCG/ACT nasal spray; Place 2 sprays into both nostrils daily. -     albuterol (PROVENTIL HFA;VENTOLIN HFA) 108 (90 Base) MCG/ACT inhaler; Inhale 1-2 puffs into the lungs every 6 (six) hours as needed.  Cough due to bronchospasm -     albuterol (PROVENTIL) (2.5 MG/3ML) 0.083% nebulizer solution 2.5 mg -     benzonatate (TESSALON) 100 MG capsule; Take 1 capsule (100 mg total) by mouth 3  (three) times daily as needed for cough. -     albuterol (PROVENTIL HFA;VENTOLIN HFA) 108 (90 Base) MCG/ACT inhaler; Inhale 1-2 puffs into the lungs every 6 (six) hours as needed.   I have discontinued Ennifer W. Carolan's albuterol. I am also having her start on benzonatate, dextromethorphan-guaiFENesin, fluticasone, and albuterol. Additionally, I am having her maintain her pantoprazole, nitroGLYCERIN, colesevelam, glipiZIDE, sitaGLIPtin, aspirin, ibuprofen, Magnesium Cl-Calcium Carbonate (SLOW MAGNESIUM/CALCIUM PO), levothyroxine, Lancet Device, metFORMIN, ciprofloxacin, sodium polystyrene, PARoxetine, glucose blood, valACYclovir, losartan, Aspirin Buf(CaCarb-MgCarb-MgO), DOCOSAHEXAENOIC ACID PO, and ACCU-CHEK MULTICLIX LANCET DEV. We will continue to administer albuterol.  Meds ordered this encounter  Medications  . albuterol (PROVENTIL) (2.5 MG/3ML) 0.083% nebulizer solution 2.5 mg  . benzonatate (TESSALON) 100 MG capsule    Sig: Take 1 capsule (100 mg total) by mouth 3 (three) times daily as needed for cough.    Dispense:  20 capsule    Refill:  0    Order Specific Question:   Supervising Provider    Answer:   Lucille Passy [[id number]]  . dextromethorphan-guaiFENesin (MUCINEX DM) 30-600 MG 12hr tablet    Sig: Take 1 tablet by mouth 2 (two) times daily as needed for cough.    Dispense:  14 tablet    Refill:  0    Order Specific Question:   Supervising Provider    Answer:   Lucille Passy [[id number]]  . fluticasone (FLONASE) 50 MCG/ACT nasal spray    Sig: Place 2 sprays into both nostrils daily.    Dispense:  16 g    Refill:  0    Order Specific Question:   Supervising Provider    Answer:   Lucille Passy [[id number]]  . albuterol (PROVENTIL HFA;VENTOLIN HFA) 108 (90 Base) MCG/ACT inhaler    Sig: Inhale 1-2 puffs into the lungs every 6 (six) hours as needed.    Dispense:  1 Inhaler    Refill:  0    Order Specific Question:   Supervising Provider    Answer:   Lucille Passy [[id number]]    Follow-up: No  follow-ups on file.  Wilfred Lacy, NP

## 2017-05-14 NOTE — Patient Instructions (Signed)
URI Instructions: Flonase and Afrin use: apply 1spray of afrin in each nare, wait 2mins, then apply 2sprays of flonase in each nare. Use both nasal spray consecutively x 3days, then flonase only for at least 14days.  Encourage adequate oral hydration.  Avoid decongestants if you have high blood pressure. Use" Delsym" or" Robitussin" cough syrup varietis for cough.  You can use plain "Tylenol" or "Advi"l for fever, chills and achyness.   "Common cold" symptoms are usually triggered by a virus.  The antibiotics are usually not necessary. On average, a" viral cold" illness would take 4-7 days to resolve. Please, make an appointment if you are not better or if you're worse.  Call office if no improvement by Friday.   Upper Respiratory Infection, Adult Most upper respiratory infections (URIs) are caused by a virus. A URI affects the nose, throat, and upper air passages. The most common type of URI is often called "the common cold." Follow these instructions at home:  Take medicines only as told by your doctor.  Gargle warm saltwater or take cough drops to comfort your throat as told by your doctor.  Use a warm mist humidifier or inhale steam from a shower to increase air moisture. This may make it easier to breathe.  Drink enough fluid to keep your pee (urine) clear or pale yellow.  Eat soups and other clear broths.  Have a healthy diet.  Rest as needed.  Go back to work when your fever is gone or your doctor says it is okay. ? You may need to stay home longer to avoid giving your URI to others. ? You can also wear a face mask and wash your hands often to prevent spread of the virus.  Use your inhaler more if you have asthma.  Do not use any tobacco products, including cigarettes, chewing tobacco, or electronic cigarettes. If you need help quitting, ask your doctor. Contact a doctor if:  You are getting worse, not better.  Your symptoms are not helped by medicine.  You have  chills.  You are getting more short of breath.  You have brown or red mucus.  You have yellow or brown discharge from your nose.  You have pain in your face, especially when you bend forward.  You have a fever.  You have puffy (swollen) neck glands.  You have pain while swallowing.  You have white areas in the back of your throat. Get help right away if:  You have very bad or constant: ? Headache. ? Ear pain. ? Pain in your forehead, behind your eyes, and over your cheekbones (sinus pain). ? Chest pain.  You have long-lasting (chronic) lung disease and any of the following: ? Wheezing. ? Long-lasting cough. ? Coughing up blood. ? A change in your usual mucus.  You have a stiff neck.  You have changes in your: ? Vision. ? Hearing. ? Thinking. ? Mood. This information is not intended to replace advice given to you by your health care provider. Make sure you discuss any questions you have with your health care provider. Document Released: 06/27/2007 Document Revised: 09/11/2015 Document Reviewed: 04/15/2013 Elsevier Interactive Patient Education  2018 [employer].

## 2017-05-27 ENCOUNTER — Other Ambulatory Visit: Payer: Self-pay | Admitting: Cardiovascular Disease

## 2017-05-27 DIAGNOSIS — I6523 Occlusion and stenosis of bilateral carotid arteries: Secondary | ICD-10-CM

## 2017-06-03 ENCOUNTER — Other Ambulatory Visit: Payer: Self-pay

## 2017-06-03 ENCOUNTER — Ambulatory Visit (INDEPENDENT_AMBULATORY_CARE_PROVIDER_SITE_OTHER): Payer: Medicare Other | Admitting: Family Medicine

## 2017-06-03 VITALS — BP 148/78 | HR 96 | Temp 97.7°F | Ht 63.5 in | Wt 147.2 lb

## 2017-06-03 DIAGNOSIS — E559 Vitamin D deficiency, unspecified: Secondary | ICD-10-CM

## 2017-06-03 DIAGNOSIS — E875 Hyperkalemia: Secondary | ICD-10-CM | POA: Diagnosis not present

## 2017-06-03 DIAGNOSIS — R5383 Other fatigue: Secondary | ICD-10-CM | POA: Diagnosis not present

## 2017-06-03 DIAGNOSIS — E118 Type 2 diabetes mellitus with unspecified complications: Secondary | ICD-10-CM | POA: Diagnosis not present

## 2017-06-03 DIAGNOSIS — N1832 Chronic kidney disease, stage 3b: Secondary | ICD-10-CM | POA: Insufficient documentation

## 2017-06-03 DIAGNOSIS — N189 Chronic kidney disease, unspecified: Secondary | ICD-10-CM

## 2017-06-03 DIAGNOSIS — N183 Chronic kidney disease, stage 3 (moderate): Secondary | ICD-10-CM

## 2017-06-03 DIAGNOSIS — E039 Hypothyroidism, unspecified: Secondary | ICD-10-CM | POA: Diagnosis not present

## 2017-06-03 DIAGNOSIS — N184 Chronic kidney disease, stage 4 (severe): Secondary | ICD-10-CM | POA: Insufficient documentation

## 2017-06-03 DIAGNOSIS — E1122 Type 2 diabetes mellitus with diabetic chronic kidney disease: Secondary | ICD-10-CM | POA: Insufficient documentation

## 2017-06-03 LAB — T4, FREE: FREE T4: 0.94 ng/dL (ref 0.60–1.60)

## 2017-06-03 LAB — POCT UA - MICROALBUMIN
CREATININE, POC: 100 mg/dL
Microalbumin Ur, POC: 150 mg/L

## 2017-06-03 LAB — VITAMIN B12: Vitamin B-12: 219 pg/mL (ref 211–911)

## 2017-06-03 LAB — BASIC METABOLIC PANEL
BUN: 23 mg/dL (ref 6–23)
CHLORIDE: 106 meq/L (ref 96–112)
CO2: 23 mEq/L (ref 19–32)
Calcium: 9.1 mg/dL (ref 8.4–10.5)
Creatinine, Ser: 1.38 mg/dL — ABNORMAL HIGH (ref 0.40–1.20)
GFR: 39.66 mL/min — ABNORMAL LOW (ref >60.00)
Glucose, Bld: 152 mg/dL — ABNORMAL HIGH (ref 70–99)
POTASSIUM: 4.9 meq/L (ref 3.5–5.1)
SODIUM: 138 meq/L (ref 135–145)

## 2017-06-03 LAB — POCT GLYCOSYLATED HEMOGLOBIN (HGB A1C): Hemoglobin A1C: 7.1

## 2017-06-03 LAB — TSH: TSH: 3.6 u[IU]/mL (ref 0.35–4.50)

## 2017-06-03 LAB — VITAMIN D 25 HYDROXY (VIT D DEFICIENCY, FRACTURES): VITD: 29.45 ng/mL — AB (ref 30.00–100.00)

## 2017-06-03 MED ORDER — VITAMIN D (ERGOCALCIFEROL) 1.25 MG (50000 UNIT) PO CAPS: 50000.0000 [IU] | ORAL_CAPSULE | ORAL | 0 refills | Status: DC

## 2017-06-03 NOTE — Progress Notes (Signed)
Subjective:   Patient ID: [name], female    DOB: 1940/12/24, 77 y.o.   MRN: [id number]  [name] is a pleasant 77 y.o. year old female who presents to clinic today with Follow-up (F/U with DM A1C was 7.1. )  on 06/03/2017  HPI:  Diabetes-  Established care with me in 02/2017.  Does not check FSBS regularly.  Currently taking Metformin 1000 every morning,500 mg at lunch, and 1000 mg at bedtime,, Januvia 100 mg daily and glipizide 2.5 mg daily.  Thinks Januvia may be causing some hand pain.  Stopped taking it for awhile and her hand pain improved.  She is now taking it again. a1c was elevated at 7.8- we discussed cutting back on carbohydrates and sugars.  Lab Results  Component Value Date   HGBA1C 7.8 (H) 03/04/2017   She was hyperkalemic when I saw her- given rx for kayexalate 15 grams po x 1 and repeated BMET the following week- it trended down to 4.9 from 5.7.  Hypothyroidism- currently taking synthroid 75 mcg daily (was increased from 50 mcg a few months ago).  Feels very fatigued lately. Lab Results  Component Value Date   TSH 3.43 04/15/2013    Current Outpatient Medications on File Prior to Visit  Medication Sig Dispense Refill  . aspirin 81 MG tablet Take 1 tablet (81 mg total) by mouth daily. Resume 4 days post-op    . Aspirin Buf,CaCarb-MgCarb-MgO, (BUFFERIN LOW DOSE) 81 MG TABS 81 mg once daily.    . benzonatate (TESSALON) 100 MG capsule Take 1 capsule (100 mg total) by mouth 3 (three) times daily as needed for cough. 20 capsule 0  . colesevelam (WELCHOL) 625 MG tablet Take 2,500 mg by mouth daily.    . DOCOSAHEXAENOIC ACID PO 1,200 mg Two (2) times a day.    Marland Kitchen glipiZIDE (GLUCOTROL XL) 2.5 MG 24 hr tablet Take 2.5 mg by mouth daily with breakfast.    . glucose blood (ACCU-CHEK GUIDE) test strip Use as instructed 100 each 12  . Lancet Device MISC 100 each by Does not apply route daily. UAD for daily glucose monitoring 100 each PRN  . Lancets Misc.  (ACCU-CHEK MULTICLIX LANCET DEV) KIT Use as directed to check sugars    . levothyroxine (SYNTHROID, LEVOTHROID) 75 MCG tablet Take 75 mcg by mouth daily.    Derrill Memo ON 07/31/2017] losartan (COZAAR) 25 MG tablet TAKE 1 TABLET BY MOUTH EVERY DAY(PT DUE TO SCHEDULE YEARLY APPOINTMENT) 90 tablet 1  . Magnesium Cl-Calcium Carbonate (SLOW MAGNESIUM/CALCIUM PO) Take 1 tablet by mouth daily.    . metFORMIN (GLUCOPHAGE) 1000 MG tablet 1 tab by mouth every morning, 1/2 tab by mouth at lunch, 1 tab by mouth at bedtime 180 tablet 1  . nitroGLYCERIN (NITROSTAT) 0.4 MG SL tablet Place 1 tablet (0.4 mg total) under the tongue every 5 (five) minutes as needed for chest pain. 25 tablet 3  . Omega-3 Fatty Acids (OMEGA-3 FISH OIL) 1200 MG CAPS 1,200 mg Two (2) times a day.    . pantoprazole (PROTONIX) 40 MG tablet Take 40 mg by mouth daily.    Marland Kitchen PARoxetine (PAXIL) 20 MG tablet Take 1 tablet (20 mg total) by mouth every morning. 90 tablet 0  . sitaGLIPtin (JANUVIA) 100 MG tablet Take 100 mg by mouth daily.    . valACYclovir (VALTREX) 1000 MG tablet TAKE 1 TABLET BY MOUTH 2  TIMES DAILY AS NEEDED FOR  VIRAL INFECTION. 180 tablet 0  .  albuterol (PROVENTIL HFA;VENTOLIN HFA) 108 (90 Base) MCG/ACT inhaler Inhale 1-2 puffs into the lungs every 6 (six) hours as needed. (Patient not taking: Reported on 06/03/2017) 1 Inhaler 0  . ciprofloxacin (CIPRO) 250 MG tablet Take 1 tablet (250 mg total) by mouth 2 (two) times daily. (Patient not taking: Reported on 05/14/2017) 10 tablet 0  . dextromethorphan-guaiFENesin (MUCINEX DM) 30-600 MG 12hr tablet Take 1 tablet by mouth 2 (two) times daily as needed for cough. (Patient not taking: Reported on 06/03/2017) 14 tablet 0  . fluticasone (FLONASE) 50 MCG/ACT nasal spray Place 2 sprays into both nostrils daily. (Patient not taking: Reported on 06/03/2017) 16 g 0  . ibuprofen (ADVIL,MOTRIN) 200 MG tablet Take 1 tablet (200 mg total) by mouth every 6 (six) hours as needed for mild pain or  moderate pain. Resume 5 days post-op as needed (Patient not taking: Reported on 06/03/2017)  0  . sodium polystyrene (KAYEXALATE) powder 15 grams po x 1 (Patient not taking: Reported on 06/03/2017) 15 g 0   Current Facility-Administered Medications on File Prior to Visit  Medication Dose Route Frequency Provider Last Rate Last Dose  . albuterol (PROVENTIL) (2.5 MG/3ML) 0.083% nebulizer solution 2.5 mg  2.5 mg Nebulization Once Nche, Charlene Brooke, NP        Allergies  Allergen Reactions  . Codeine Nausea And Vomiting and Other (See Comments)    Severe stomach cramps  . Antihistamines, Diphenhydramine-Type Other (See Comments)    Causes hyperactivity  . Statins Other (See Comments)    Joint pains  . Erythromycin Hives and Rash    Due to dental work about 50 years ago. Was used in packing and resulted in rash/hives inside and outside of mouth.    Past Medical History:  Diagnosis Date  . Anemia    childhood  . Anxiety   . Arterial stenosis (HCC)    mesenteric  . Arthritis   . Breast cancer ([place]) 12/05/10   R breast, inv mammary, in situ,ER/PR +,HER2 -  . Cancer ([place])   . Carcinoma of breast treated with adjuvant chemotherapy ([place])   . Coronary artery disease    stent 2007  . Depression   . Diabetes mellitus   . Difficulty sleeping   . Diverticulosis 12/10/03  . Elevated cholesterol   . Generalized weakness   . GERD (gastroesophageal reflux disease)   . Heart murmur    secondary to chem. 2013  . Hepatitis    as an infant  . HSV-1 (herpes simplex virus 1) infection   . Hyperlipidemia   . Hypertension   . Hypothyroidism   . Osteoarthritis   . Personal history of chemotherapy   . Personal history of radiation therapy   . Pneumonia    2014 september  . 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 Spinal stenosis   . Thyroid disease     Past Surgical History:  Procedure Laterality Date  . ANTERIOR AND POSTERIOR REPAIR N/A 04/05/2016   Procedure: ANTERIOR  (CYSTOCELE) AND POSTERIOR REPAIR (RECTOCELE);  Surgeon: [name], MD;  Location: [facility];  Service: Gynecology;  Laterality: N/A;  . BREAST LUMPECTOMY  1983   benign biopsy  . BREAST LUMPECTOMY Right 12/29/2010   snbx, ER/PR +, Her2 -, 0/1 node pos.  . CAROTID STENT  2007  . CARPAL TUNNEL RELEASE Bilateral 9/12,8,12  . CHOLECYSTECTOMY    . COLONOSCOPY N/A 02/09/2013   Procedure: COLONOSCOPY;  Surgeon: [name], MD;  Location: [name];  Service: Endoscopy;  Laterality: N/A;  . CORONARY ANGIOPLASTY    . DILATION AND CURETTAGE OF UTERUS    . ESOPHAGOGASTRODUODENOSCOPY N/A 02/09/2013   Procedure: ESOPHAGOGASTRODUODENOSCOPY (EGD);  Surgeon: [name], MD;  Location: [name];  Service: Endoscopy;  Laterality: N/A;  . FOOT SPURS    . HYSTEROSCOPY W/D&C N/A 09/11/2012   Procedure: DILATATION AND CURETTAGE ;  Surgeon: [name], MD;  Location: [facility];  Service: Gynecology;  Laterality: N/A;  . KNEE SURGERY  2007  . LAPAROSCOPIC ASSISTED VAGINAL HYSTERECTOMY N/A 04/05/2016   Procedure: LAPAROSCOPIC ASSISTED VAGINAL HYSTERECTOMY possible BSO;  Surgeon: [name], MD;  Location: [facility];  Service: Gynecology;  Laterality: N/A;  . LUMBAR LAMINECTOMY/DECOMPRESSION MICRODISCECTOMY N/A 10/20/2014   Procedure: MICRO LUMBER DECOMPRESSION L3-4 L4-5;  Surgeon: [name], MD;  Location: [facility];  Service: Orthopedics;  Laterality: N/A;  . LUMBAR LAMINECTOMY/DECOMPRESSION MICRODISCECTOMY Bilateral 10/13/2015   Procedure: MICRO LUMBAR DECOMPRESSION L5 - S1 AND REDO DECOMPRESSION L4 - L5 AND REMOVAL OF FACET CYST L4 - L5 2 LEVELS;  Surgeon: [name], MD;  Location: [facility];  Service: Orthopedics;  Laterality: Bilateral;  . PORT-A-CATH REMOVAL  04/17/2011   Procedure: REMOVAL PORT-A-CATH;  Surgeon: [name], MD;  Location: [facility];  Service: General;  Laterality: Left;  . PORTACATH PLACEMENT  02/07/2011   Procedure: INSERTION PORT-A-CATH;  Surgeon:  [name], MD;  Location: [facility];  Service: General;  Laterality: N/A;    Family History  Problem Relation Age of Onset  . Throat cancer Father   . Alcoholism Father   . Heart attack Father   . Heart disease Mother   . Lymphoma Brother 53  . Cancer Son   . Heart attack Brother   . Diabetes Brother   . Hypertension Brother   . Colon cancer Neg Hx   . Stroke Neg Hx     Social History   Socioeconomic History  . Marital status: Married    Spouse name: Not on file  . Number of children: 2  . Years of education: Not on file  . Highest education level: Not on file  Occupational History  . Occupation: retired  Scientific laboratory technician  . Financial resource strain: Not on file  . Food insecurity:    Worry: Not on file    Inability: Not on file  . Transportation needs:    Medical: Not on file    Non-medical: Not on file  Tobacco Use  . Smoking status: Former Smoker    Packs/day: 2.00    Years: 40.00    Pack years: 80.00    Last attempt to quit: 12/19/1997    Years since quitting: 19.4  . Smokeless tobacco: Never Used  Substance and Sexual Activity  . Alcohol use: Yes    Alcohol/week: 3.6 oz    Types: 6 Glasses of wine per week    Comment: occasional  . Drug use: No  . Sexual activity: Not on file    Comment: menarch age 49, P51, HRT X 10 YRS, MENOPAUSE MID 40'S  Lifestyle  . Physical activity:    Days per week: Not on file    Minutes per session: Not on file  . Stress: Not on file  Relationships  . Social connections:    Talks on phone: Not on file    Gets together: Not on file    Attends religious service: Not on file    Active member of club or organization: Not on  file    Attends meetings of clubs or organizations: Not on file    Relationship status: Not on file  . Intimate partner violence:    Fear of current or ex partner: Not on file    Emotionally abused: Not on file    Physically abused: Not on file    Forced sexual activity: Not on file  Other Topics  Concern  . Not on file  Social History Narrative   [name] age 49- [place] age 89  [place]   The PMH, [place], Social History, Family History, Medications, and allergies have been reviewed in [facility], and have been updated if relevant.   Review of Systems  Constitutional: Positive for fatigue.  HENT: Negative.   Eyes: Negative.   Respiratory: Negative.   Cardiovascular: Negative.   Gastrointestinal: Negative.   Endocrine: Negative.   Genitourinary: Negative.   Musculoskeletal: Positive for arthralgias.  Neurological: Negative.   Hematological: Negative.   Psychiatric/Behavioral: Negative.   All other systems reviewed and are negative.      Objective:    BP (!) 148/78 (BP Location: Left Arm, Patient Position: Sitting, Cuff Size: Normal)   Pulse 96   Temp 97.7 F (36.5 C) (Oral)   Ht 5' 3.5" (1.613 m)   Wt 147 lb 3.2 oz (66.8 kg)   SpO2 95%   BMI 25.67 kg/m    Physical Exam   General:  Well-developed,well-nourished,in no acute distress; alert,appropriate and cooperative throughout examination Head:  normocephalic and atraumatic.   Eyes:  vision grossly intact, PERRL Ears:  R ear normal and L ear normal externally, TMs clear bilaterally Nose:  no external deformity.   Mouth:  good dentition.   Neck:  No deformities, masses, or tenderness noted. No thyroidmegaly Lungs:  Normal respiratory effort, chest expands symmetrically. Lungs are clear to auscultation, no crackles or wheezes. Heart:  +murmur Msk:  No deformity or scoliosis noted of thoracic or lumbar spine.   Extremities:  No clubbing, cyanosis, edema, or deformity noted with normal full range of motion of all joints.   Neurologic:  alert & oriented X3 and gait normal.   Skin:  Intact without suspicious lesions or rashes Psych:  Cognition and judgment appear intact. Alert and cooperative with normal attention span and concentration. No apparent delusions, illusions, hallucinations       Assessment &  Plan:   Type 2 diabetes mellitus with complication, without long-term current use of insulin (HCC)  Hyperkalemia No follow-ups on file.

## 2017-06-03 NOTE — Assessment & Plan Note (Signed)
Check labs today. The patient indicates understanding of these issues and agrees with the plan.

## 2017-06-03 NOTE — Assessment & Plan Note (Signed)
Repeat BMET today.  

## 2017-06-03 NOTE — Assessment & Plan Note (Signed)
Continue current dose of synthroid. She feels very fatigued. Check thyroid panel today.

## 2017-06-03 NOTE — Assessment & Plan Note (Signed)
Improved! No changes made. Advised to keep up the good work.

## 2017-06-03 NOTE — Patient Instructions (Signed)
Great to see you. I will call you with your lab results from today and you can view them online.   

## 2017-06-05 ENCOUNTER — Ambulatory Visit (HOSPITAL_COMMUNITY)
Admission: RE | Admit: 2017-06-05 | Discharge: 2017-06-05 | Disposition: A | Discharge status: Home / Self Care | Payer: Medicare Other | Source: Ambulatory Visit | Attending: Internal Medicine | Admitting: Internal Medicine

## 2017-06-05 DIAGNOSIS — Z87891 Personal history of nicotine dependence: Secondary | ICD-10-CM | POA: Insufficient documentation

## 2017-06-05 DIAGNOSIS — I2510 Atherosclerotic heart disease of native coronary artery without angina pectoris: Secondary | ICD-10-CM | POA: Diagnosis not present

## 2017-06-05 DIAGNOSIS — E785 Hyperlipidemia, unspecified: Secondary | ICD-10-CM | POA: Diagnosis not present

## 2017-06-05 DIAGNOSIS — E119 Type 2 diabetes mellitus without complications: Secondary | ICD-10-CM | POA: Diagnosis not present

## 2017-06-05 DIAGNOSIS — I6523 Occlusion and stenosis of bilateral carotid arteries: Secondary | ICD-10-CM | POA: Diagnosis not present

## 2017-06-05 DIAGNOSIS — I10 Essential (primary) hypertension: Secondary | ICD-10-CM | POA: Insufficient documentation

## 2017-06-12 ENCOUNTER — Telehealth: Payer: Self-pay | Admitting: Family Medicine

## 2017-06-12 ENCOUNTER — Other Ambulatory Visit: Payer: Self-pay | Admitting: Family Medicine

## 2017-06-12 MED ORDER — PAROXETINE HCL 20 MG PO TABS: 20.0000 mg | ORAL_TABLET | ORAL | 1 refills | Status: DC

## 2017-06-12 NOTE — Telephone Encounter (Signed)
Copied from [place] [id number]. Topic: Quick Communication - See Telephone Encounter >> Jun 12, 2017  2:24 PM Hewitt Shorts wrote: Pt is needing a refill on her paxil   [facility]   Best number [phone]

## 2017-06-13 DIAGNOSIS — M5136 Other intervertebral disc degeneration, lumbar region: Secondary | ICD-10-CM | POA: Insufficient documentation

## 2017-06-18 NOTE — Progress Notes (Signed)
Patient ID: [name], female   DOB: 16-Mar-1940, 77 y.o.   MRN: [id number]    06/19/2017 [name]   May 27, 1940  [id number]  Primary Physician Lucille Passy, MD Primary Cardiologist: Dr Johnsie Cancel  HPI:  77 y.o.  female with a history of CAD, s/p RCA HSRA and DES (Taxus) in 2007. Cath in 2008 showed patent RCA and no significant Lt system disease. She has done well from a cardiac standpoint. She had breast cancer in 2012 and has not had recurrence. She denies any palpitations, dyspnea, or chest pain. She has chronic left bruit with duplex showing plaque no stenosis and moderate AS mean gradient 23 mmHg by last TTE done 12/12/16  Myovue reviewed 11/22/15 normal no ischemia EF 70%   Had uncomplicated lap hysterectomy Dr Julien Girt 04/05/16    Pain in legs seems like mixture of possible PAD, arthritis and neuropathy in feet Has had cortisone shots in knees with good relief and last week had one in her Back for sciatica     Current Outpatient Medications  Medication Sig Dispense Refill  . aspirin 81 MG tablet Take 1 tablet (81 mg total) by mouth daily. Resume 4 days post-op    . Aspirin Buf,CaCarb-MgCarb-MgO, (BUFFERIN LOW DOSE) 81 MG TABS 81 mg once daily.    . benzonatate (TESSALON) 100 MG capsule Take 1 capsule (100 mg total) by mouth 3 (three) times daily as needed for cough. 20 capsule 0  . colesevelam (WELCHOL) 625 MG tablet Take 2,500 mg by mouth daily.    . DOCOSAHEXAENOIC ACID PO 1,200 mg Two (2) times a day.    Marland Kitchen glipiZIDE (GLUCOTROL XL) 2.5 MG 24 hr tablet Take 2.5 mg by mouth daily with breakfast.    . glucose blood (ACCU-CHEK GUIDE) test strip Use as instructed 100 each 12  . ibuprofen (ADVIL,MOTRIN) 200 MG tablet Take 1 tablet (200 mg total) by mouth every 6 (six) hours as needed for mild pain or moderate pain. Resume 5 days post-op as needed  0  . Lancet Device MISC 100 each by Does not apply route daily. UAD for daily glucose monitoring 100 each PRN  . Lancets Misc.  (ACCU-CHEK MULTICLIX LANCET DEV) KIT Use as directed to check sugars    . levothyroxine (SYNTHROID, LEVOTHROID) 75 MCG tablet Take 75 mcg by mouth daily.    Derrill Memo ON 07/31/2017] losartan (COZAAR) 25 MG tablet TAKE 1 TABLET BY MOUTH EVERY DAY(PT DUE TO SCHEDULE YEARLY APPOINTMENT) 90 tablet 1  . Magnesium Cl-Calcium Carbonate (SLOW MAGNESIUM/CALCIUM PO) Take 1 tablet by mouth daily.    . metFORMIN (GLUCOPHAGE) 1000 MG tablet 1 tab by mouth every morning, 1/2 tab by mouth at lunch, 1 tab by mouth at bedtime 180 tablet 1  . nitroGLYCERIN (NITROSTAT) 0.4 MG SL tablet Place 1 tablet (0.4 mg total) under the tongue every 5 (five) minutes as needed for chest pain. 25 tablet 3  . Omega-3 Fatty Acids (OMEGA-3 FISH OIL) 1200 MG CAPS 1,200 mg Two (2) times a day.    . pantoprazole (PROTONIX) 40 MG tablet Take 40 mg by mouth daily.    Marland Kitchen PARoxetine (PAXIL) 20 MG tablet Take 1 tablet (20 mg total) by mouth every morning. 90 tablet 1  . sitaGLIPtin (JANUVIA) 100 MG tablet Take 100 mg by mouth daily.    . valACYclovir (VALTREX) 1000 MG tablet TAKE 1 TABLET BY MOUTH 2  TIMES DAILY AS NEEDED FOR  VIRAL INFECTION. 180 tablet 0  . Vitamin D,  Ergocalciferol, (DRISDOL) 50000 units CAPS capsule Take 1 capsule (50,000 Units total) by mouth every 7 (seven) days. 6 capsule 0   Current Facility-Administered Medications  Medication Dose Route Frequency Provider Last Rate Last Dose  . albuterol (PROVENTIL) (2.5 MG/3ML) 0.083% nebulizer solution 2.5 mg  2.5 mg Nebulization Once Nche, Charlene Brooke, NP        Allergies  Allergen Reactions  . Codeine Nausea And Vomiting and Other (See Comments)    Severe stomach cramps  . Antihistamines, Diphenhydramine-Type Other (See Comments)    Causes hyperactivity  . Statins Other (See Comments)    Joint pains  . Erythromycin Hives and Rash    Due to dental work about 50 years ago. Was used in packing and resulted in rash/hives inside and outside of mouth.    Social History    Socioeconomic History  . Marital status: Married    Spouse name: Not on file  . Number of children: 2  . Years of education: Not on file  . Highest education level: Not on file  Occupational History  . Occupation: retired  Scientific laboratory technician  . Financial resource strain: Not on file  . Food insecurity:    Worry: Not on file    Inability: Not on file  . Transportation needs:    Medical: Not on file    Non-medical: Not on file  Tobacco Use  . Smoking status: Former Smoker    Packs/day: 2.00    Years: 40.00    Pack years: 80.00    Last attempt to quit: 12/19/1997    Years since quitting: 19.5  . Smokeless tobacco: Never Used  Substance and Sexual Activity  . Alcohol use: Yes    Alcohol/week: 3.6 oz    Types: 6 Glasses of wine per week    Comment: occasional  . Drug use: No  . Sexual activity: Not on file    Comment: menarch age 24, P26, HRT X 10 YRS, MENOPAUSE MID 40'S  Lifestyle  . Physical activity:    Days per week: Not on file    Minutes per session: Not on file  . Stress: Not on file  Relationships  . Social connections:    Talks on phone: Not on file    Gets together: Not on file    Attends religious service: Not on file    Active member of club or organization: Not on file    Attends meetings of clubs or organizations: Not on file    Relationship status: Not on file  . Intimate partner violence:    Fear of current or ex partner: Not on file    Emotionally abused: Not on file    Physically abused: Not on file    Forced sexual activity: Not on file  Other Topics Concern  . Not on file  Social History Narrative   [name] age 27- [name] age 23  [place]     Review of Systems: General: negative for chills, fever, night sweats or weight changes.  Cardiovascular: negative for chest pain, dyspnea on exertion, edema, orthopnea, palpitations, paroxysmal nocturnal dyspnea or shortness of breath Dermatological: negative for rash Respiratory: negative  for cough or wheezing Urologic: negative for hematuria Abdominal: negative for nausea, vomiting, diarrhea, bright red blood per rectum, melena, or hematemesis Neurologic: negative for visual changes, syncope, or dizziness All other systems reviewed and are otherwise negative except as noted above.    Blood pressure 128/68, pulse 97, height 5' 3.5" (1.613 m), weight  147 lb 3.2 oz (66.8 kg), SpO2 96 %.  Affect appropriate Healthy:  appears stated age 89: normal Neck supple with no adenopathy JVP normal left  bruits no thyromegaly Lungs clear AS  murmur, no rub, gallop or click PMI normal Abdomen: benighn, BS positve, no tenderness, no AAA no bruit.  No HSM or HJR Distal pulses hard to palpate in feet  No edema Neuro non-focal Skin warm and dry No muscular weakness    ECG:  12/27/14 SR rate 91 normal  05/14/16  SR rate 94 normal 06/19/17 SR rate 97 PVC LAE nonspecific ST changes   ASSESSMENT AND PLAN:   CAD S/P PCI 2007 RCA HSRA and Taxus stent in 2007 by Dr Lia Foyer.  Re look cath in 2008 OK, no Lt system disease and RCA patent  11/22/15 myovue normal ef 59% Continue medical Rx    Bruit:  Paque no stenosis duplex 06/05/17 ASA f/u May 2021  Essential hypertension Well controlled.  Continue current medications and low sodium Dash type diet.     Elevated lipids On Welchol, statin intolerant- labs with primary LFTls normal March 2018   Moderate  aortic stenosis Echo  12/12/16 mean gradient 21 mmHg peak 34 mmHg f/u echo November 2019   History of breast cancer Rt mastectomy Dec 2012 followed by chemo and radiation  Spinal stenosis of lumbar region Surgery Sept 2016 suspect her leg pain is from this  ABI's normal 06/13/15  GERD :  Continue protonix low carb diet finished with carafate   OB/GYN:  Post hysterectomy on 04/05/16  No cardiac complications f/u Dr Julien Girt   PVD:  Pain in legs partly neuropathy and arthritis ABI's normal 01/17/17  F/u with me in 6 months     [name]

## 2017-06-19 ENCOUNTER — Encounter: Payer: Self-pay | Admitting: Cardiovascular Disease

## 2017-06-19 ENCOUNTER — Ambulatory Visit (INDEPENDENT_AMBULATORY_CARE_PROVIDER_SITE_OTHER): Payer: Medicare Other | Admitting: Cardiovascular Disease

## 2017-06-19 VITALS — BP 128/68 | HR 97 | Ht 63.5 in | Wt 147.2 lb

## 2017-06-19 DIAGNOSIS — I10 Essential (primary) hypertension: Secondary | ICD-10-CM | POA: Diagnosis not present

## 2017-06-19 DIAGNOSIS — I350 Nonrheumatic aortic (valve) stenosis: Secondary | ICD-10-CM | POA: Diagnosis not present

## 2017-06-19 NOTE — Patient Instructions (Addendum)
Medication Instructions:  Your physician recommends that you continue on your current medications as directed. Please refer to the Current Medication list given to you today.  Labwork: NONE  Testing/Procedures: Your physician has requested that you have an echocardiogram in November. Echocardiography is a painless test that uses sound waves to create images of your heart. It provides your doctor with information about the size and shape of your heart and how well your heart's chambers and valves are working. This procedure takes approximately one hour. There are no restrictions for this procedure.  Follow-Up: Your physician wants you to follow-up in: 12 months with Dr. Johnsie Cancel. You will receive a reminder letter in the mail two months in advance. If you don't receive a letter, please call our office to schedule the follow-up appointment.   If you need a refill on your cardiac medications before your next appointment, please call your pharmacy.

## 2017-06-28 ENCOUNTER — Telehealth: Payer: Self-pay | Admitting: Family Medicine

## 2017-06-28 NOTE — Telephone Encounter (Signed)
Spoke with the pt, she is aware.   Per Dr. Konrad Dolores VitD 1600 IU daily. Recheck in 8-12 weeks (268.0)

## 2017-06-28 NOTE — Telephone Encounter (Signed)
Copied from [place] [id number]. Topic: Quick Communication - See Telephone Encounter >> Jun 28, 2017 10:43 AM Neva Seat wrote: Pt is needing to know how many units of Vitamin D she will need to take dailey.  Please call pt back to let her know. If no answer, please leave vm.

## 2017-08-06 ENCOUNTER — Telehealth: Payer: Self-pay | Admitting: Family Medicine

## 2017-08-06 NOTE — Telephone Encounter (Signed)
Copied from [place] [id number]. Topic: Quick Communication - See Telephone Encounter >> Aug 06, 2017  9:21 AM Mylinda Latina, NT wrote: CRM for notification. See Telephone encounter for: 08/06/17. Nel calling form [facility] is needs verbal authorization for this patient to be sent out colesevelam (WELCHOL) 625 MG tablet. Please call to p[provide the authorization CB# [id number] reference # [id number]

## 2017-08-08 ENCOUNTER — Other Ambulatory Visit: Payer: Self-pay

## 2017-08-08 MED ORDER — COLESEVELAM HCL 625 MG PO TABS: 2500.0000 mg | ORAL_TABLET | Freq: Every day | ORAL | 3 refills | Status: DC

## 2017-08-08 NOTE — Telephone Encounter (Signed)
Sent Rx to [facility]/thx dmf

## 2017-08-23 ENCOUNTER — Other Ambulatory Visit: Payer: Self-pay | Admitting: Family Medicine

## 2017-08-23 ENCOUNTER — Other Ambulatory Visit: Payer: Self-pay

## 2017-08-23 MED ORDER — PANTOPRAZOLE SODIUM 40 MG PO TBEC: 40.0000 mg | DELAYED_RELEASE_TABLET | Freq: Every day | ORAL | 0 refills | Status: DC

## 2017-08-23 MED ORDER — SITAGLIPTIN PHOSPHATE 100 MG PO TABS: 100.0000 mg | ORAL_TABLET | Freq: Every day | ORAL | 0 refills | Status: DC

## 2017-08-23 MED ORDER — GLIPIZIDE ER 2.5 MG PO TB24: 2.5000 mg | ORAL_TABLET | Freq: Every day | ORAL | 0 refills | Status: DC

## 2017-09-04 ENCOUNTER — Ambulatory Visit: Payer: Medicare Other | Admitting: Family Medicine

## 2017-09-10 ENCOUNTER — Encounter: Payer: Self-pay | Admitting: Family Medicine

## 2017-09-10 ENCOUNTER — Ambulatory Visit (INDEPENDENT_AMBULATORY_CARE_PROVIDER_SITE_OTHER): Payer: Medicare Other | Admitting: Family Medicine

## 2017-09-10 VITALS — BP 128/66 | HR 85 | Temp 97.8°F | Ht 64.0 in | Wt 144.2 lb

## 2017-09-10 DIAGNOSIS — E559 Vitamin D deficiency, unspecified: Secondary | ICD-10-CM | POA: Diagnosis not present

## 2017-09-10 DIAGNOSIS — E538 Deficiency of other specified B group vitamins: Secondary | ICD-10-CM | POA: Insufficient documentation

## 2017-09-10 DIAGNOSIS — E1140 Type 2 diabetes mellitus with diabetic neuropathy, unspecified: Secondary | ICD-10-CM | POA: Diagnosis not present

## 2017-09-10 DIAGNOSIS — G629 Polyneuropathy, unspecified: Secondary | ICD-10-CM | POA: Diagnosis not present

## 2017-09-10 LAB — COMPREHENSIVE METABOLIC PANEL
ALK PHOS: 58 U/L (ref 39–117)
ALT: 13 U/L (ref 0–35)
AST: 16 U/L (ref 0–37)
Albumin: 4.1 g/dL (ref 3.5–5.2)
BUN: 22 mg/dL (ref 6–23)
CHLORIDE: 104 meq/L (ref 96–112)
CO2: 26 mEq/L (ref 19–32)
Calcium: 9.6 mg/dL (ref 8.4–10.5)
Creatinine, Ser: 1.43 mg/dL — ABNORMAL HIGH (ref 0.40–1.20)
GFR: 38.04 mL/min — AB (ref >60.00)
GLUCOSE: 146 mg/dL — AB (ref 70–99)
POTASSIUM: 4.9 meq/L (ref 3.5–5.1)
Sodium: 139 mEq/L (ref 135–145)
TOTAL PROTEIN: 7.3 g/dL (ref 6.0–8.3)
Total Bilirubin: 0.5 mg/dL (ref 0.2–1.2)

## 2017-09-10 LAB — HEMOGLOBIN A1C: HEMOGLOBIN A1C: 7.2 % — AB (ref 4.6–6.5)

## 2017-09-10 LAB — VITAMIN B12: Vitamin B-12: 507 pg/mL (ref 211–911)

## 2017-09-10 LAB — VITAMIN D 25 HYDROXY (VIT D DEFICIENCY, FRACTURES): VITD: 50.83 ng/mL (ref 30.00–100.00)

## 2017-09-10 NOTE — Assessment & Plan Note (Signed)
Feels less fatigued after course of high dose vitamin D.  Continue current dose of Vit D.  Check vitamin D today.

## 2017-09-10 NOTE — Patient Instructions (Signed)
Great to see you. I will call you with your lab results from today and you can view them online.   

## 2017-09-10 NOTE — Assessment & Plan Note (Signed)
Continue current dose of Vit B12. Check Vit B12 today.

## 2017-09-10 NOTE — Progress Notes (Signed)
Subjective:   Patient ID: [name], female    DOB: December 22, 1940, 77 y.o.   MRN: [id number]  [name] is a pleasant 77 y.o. year old female who presents to clinic today with Follow-up (Patient is here today for a 75-month-F/U.  At 5.13.19 visit labs were drawn and it was found that her Vit-D and B-12 were low.  She was advised to take Rx Vit-d 50k 1qwk then when completed take 1.6k IU OTC as well as Vit-B12 562mcg OTC and repeat in 8-12 weeks.  She is taking Vit-D 2k IU and B12 5103mcg as directed.  She states that she is feeling quite a bit better.  ) and Neuropathy (She is also here today to discuss burning and stinging and pain in feet bilaterally.  When she is standing it does not bother her but as soon as she sits in the recliner or goes to lay in bed it starts up.  It keeps her from getting sleep.  This started a year ago but worsened about 4 months ago.)  on 09/10/2017  HPI:  Established care with me in 02/2017.  Fatigue-  In May, we discussed that her Vit B12 and Vit D were both low.  eRx sent for a course high dose weekly VIt D which she completed and is now taking 1600 IU of Vit D OTC daily.  Taking Vit B12 500 mcg daily.  Feels better but has noticed increased neuropathy in her feet over the past four months. She mainly feels it in the tips of her toes and at night or when her feet are elevated.  Has tried gabapentin in past but could not tolerate this- urinary incontinence.  Diabetes- Does not check FSBS regularly.  Currently taking Metformin 1000 every morning,500 mg at lunch, and 1000 mg at bedtime, Januvia 100 mg daily and glipizide 2.5 mg daily.   Lab Results  Component Value Date   HGBA1C 7.1 06/03/2017    Hypothyroidism- taking synthroid 75 mcg daily.  Lab Results  Component Value Date   TSH 3.60 06/03/2017     Review of Systems  Constitutional: Positive for fatigue.  Respiratory: Negative.   Cardiovascular: Negative.   Gastrointestinal: Negative.     Genitourinary: Negative.   Musculoskeletal: Negative.   Neurological: Positive for numbness.  Hematological: Negative.   Psychiatric/Behavioral: Negative.   All other systems reviewed and are negative.      Objective:    BP 128/66 (BP Location: Left Arm, Patient Position: Sitting, Cuff Size: Normal)   Pulse 85   Temp 97.8 F (36.6 C) (Oral)   Ht 5\' 4"  (1.626 m)   Wt 144 lb 3.2 oz (65.4 kg)   SpO2 96%   BMI 24.75 kg/m    Physical Exam  Constitutional: She is oriented to person, place, and time. She appears well-developed and well-nourished. No distress.  HENT:  Head: Normocephalic and atraumatic.  Eyes: EOM are normal.  Neck: Normal range of motion.  Cardiovascular: Normal rate and regular rhythm.  Pulmonary/Chest: Effort normal and breath sounds normal.  Musculoskeletal: Normal range of motion. She exhibits no edema.  Feet:  Right Foot:  Skin Integrity: Negative for ulcer or skin breakdown.  Left Foot:  Skin Integrity: Negative for ulcer or skin breakdown.  Neurological: She is alert and oriented to person, place, and time. No cranial nerve deficit.  Skin: Skin is warm and dry. She is not diaphoretic.  Psychiatric: She has a normal mood and affect. Her behavior is normal.  Judgment and thought content normal.  Nursing note and vitals reviewed.         Assessment & Plan:   Neuropathy  Type 2 diabetes mellitus with diabetic neuropathy, without long-term current use of insulin (HCC) - Plan: Hemoglobin A1c, Comprehensive metabolic panel  Vitamin D deficiency - Plan: Vitamin D (25 hydroxy)  Vitamin B12 deficiency - Plan: B12 No follow-ups on file.

## 2017-09-10 NOTE — Assessment & Plan Note (Signed)
Deteriorated- Likely due to diabetes and has deteriorated over the past 4 months. She does not want to try any additional rx at this time. Could not tolerate gabapentin.  ? Trial of lyrica if symptoms worsen. The patient indicates understanding of these issues and agrees with the plan.

## 2017-09-10 NOTE — Assessment & Plan Note (Signed)
Continue current rxs.  Check a1c today. She does not want to try additional rx for neuropathy at this time.

## 2017-09-30 ENCOUNTER — Other Ambulatory Visit: Payer: Self-pay | Admitting: Family Medicine

## 2017-10-02 ENCOUNTER — Other Ambulatory Visit: Payer: Self-pay | Admitting: Family Medicine

## 2017-10-14 ENCOUNTER — Other Ambulatory Visit: Payer: Self-pay | Admitting: Family Medicine

## 2017-10-28 ENCOUNTER — Encounter: Payer: Self-pay | Admitting: Nurse Practitioner

## 2017-10-28 ENCOUNTER — Ambulatory Visit: Payer: Self-pay | Admitting: *Deleted

## 2017-10-28 ENCOUNTER — Ambulatory Visit (INDEPENDENT_AMBULATORY_CARE_PROVIDER_SITE_OTHER): Payer: Medicare Other | Admitting: Nurse Practitioner

## 2017-10-28 VITALS — BP 126/66 | HR 73 | Temp 97.6°F | Ht 64.0 in | Wt 150.0 lb

## 2017-10-28 DIAGNOSIS — R51 Headache: Secondary | ICD-10-CM | POA: Diagnosis not present

## 2017-10-28 DIAGNOSIS — E118 Type 2 diabetes mellitus with unspecified complications: Secondary | ICD-10-CM | POA: Diagnosis not present

## 2017-10-28 DIAGNOSIS — R519 Headache, unspecified: Secondary | ICD-10-CM

## 2017-10-28 LAB — CBC WITH DIFFERENTIAL/PLATELET
Basophils Absolute: 0.1 10*3/uL (ref 0.0–0.1)
Basophils Relative: 0.9 % (ref 0.0–3.0)
Eosinophils Absolute: 0.3 10*3/uL (ref 0.0–0.7)
Eosinophils Relative: 3.6 % (ref 0.0–5.0)
HCT: 27.8 % — ABNORMAL LOW (ref 36.0–46.0)
Hemoglobin: 9 g/dL — ABNORMAL LOW (ref 12.0–15.0)
Lymphocytes Relative: 34.4 % (ref 12.0–46.0)
Lymphs Abs: 2.6 10*3/uL (ref 0.7–4.0)
MCHC: 32.6 g/dL (ref 30.0–36.0)
MCV: 79.7 fl (ref 78.0–100.0)
Monocytes Absolute: 0.8 10*3/uL (ref 0.1–1.0)
Monocytes Relative: 10.5 % (ref 3.0–12.0)
Neutro Abs: 3.7 10*3/uL (ref 1.4–7.7)
Neutrophils Relative %: 50.6 % (ref 43.0–77.0)
Platelets: 201 10*3/uL (ref 150.0–400.0)
RBC: 3.48 Mil/uL — ABNORMAL LOW (ref 3.87–5.11)
RDW: 15.5 % (ref 11.5–15.5)
WBC: 7.4 10*3/uL (ref 4.0–10.5)

## 2017-10-28 LAB — C-REACTIVE PROTEIN: CRP: 0.3 mg/dL — AB (ref 0.5–20.0)

## 2017-10-28 LAB — SEDIMENTATION RATE: Sed Rate: 23 mm/hr (ref 0–30)

## 2017-10-28 LAB — GLUCOSE, POCT (MANUAL RESULT ENTRY): POC GLUCOSE: 151 mg/dL — AB (ref 70–99)

## 2017-10-28 NOTE — Patient Instructions (Addendum)
You will be contacted to schedule MRI brain.  Use tylenol for headache. Go to ED is symptoms worsen  Continue to monitor BP twice a day. Call office if BP >150/90.  Go to lab for blood draw.  General Headache Without Cause A headache is pain or discomfort felt around the head or neck area. There are many causes and types of headaches. In some cases, the cause may not be found. Follow these instructions at home: Managing pain  Take over-the-counter and prescription medicines only as told by your doctor.  Lie down in a dark, quiet room when you have a headache.  If directed, apply ice to the head and neck area: ? Put ice in a plastic bag. ? Place a towel between your skin and the bag. ? Leave the ice on for 20 minutes, 2-3 times per day.  Use a heating pad or hot shower to apply heat to the head and neck area as told by your doctor.  Keep lights dim if bright lights bother you or make your headaches worse. Eating and drinking  Eat meals on a regular schedule.  Lessen how much alcohol you drink.  Lessen how much caffeine you drink, or stop drinking caffeine. General instructions  Keep all follow-up visits as told by your doctor. This is important.  Keep a journal to find out if certain things bring on headaches. For example, write down: ? What you eat and drink. ? How much sleep you get. ? Any change to your diet or medicines.  Relax by getting a massage or doing other relaxing activities.  Lessen stress.  Sit up straight. Do not tighten (tense) your muscles.  Do not use tobacco products. This includes cigarettes, chewing tobacco, or e-cigarettes. If you need help quitting, ask your doctor.  Exercise regularly as told by your doctor.  Get enough sleep. This often means 7-9 hours of sleep. Contact a doctor if:  Your symptoms are not helped by medicine.  You have a headache that feels different than the other headaches.  You feel sick to your stomach (nauseous)  or you throw up (vomit).  You have a fever. Get help right away if:  Your headache becomes really bad.  You keep throwing up.  You have a stiff neck.  You have trouble seeing.  You have trouble speaking.  You have pain in the eye or ear.  Your muscles are weak or you lose muscle control.  You lose your balance or have trouble walking.  You feel like you will pass out (faint) or you pass out.  You have confusion. This information is not intended to replace advice given to you by your health care provider. Make sure you discuss any questions you have with your health care provider. Document Released: 10/18/2007 Document Revised: 06/16/2015 Document Reviewed: 05/03/2014 Elsevier Interactive Patient Education  [employer].

## 2017-10-28 NOTE — Telephone Encounter (Signed)
Pt reports headaches at night, onset 3 nights ago. States 7-8/10; resolves throughout day.  Reports headache at forehead and behind eyes, back of neck. Also reports "Some pain in the sinus area."  Reports tylenol was effective until last night. States checked BP last night, 173/89; this am 117//69. BS 116 this am. Pt denies any SOB, CP, weakness. Also states "Mouth very dry, more so than usual." States is staying hydrated. Also reports 5lb weight gain since onset.  Agent secured appt with C. Nche for 1300 today prior to triage. Care advise given per protocol.     Reason for Disposition . [1] MODERATE headache (e.g., interferes with normal activities) AND [2] present > 24 hours AND [3] unexplained  (Exceptions: analgesics not tried, typical migraine, or headache part of viral illness)  Answer Assessment - Initial Assessment Questions 1. LOCATION: "Where does it hurt?"     Behind eyes and forehead, "At times into sinus area." 2. ONSET: "When did the headache start?" (Minutes, hours or days)      3 nights ago 3. PATTERN: "Does the pain come and go, or has it been constant since it started?"     Only at night 4. SEVERITY: "How bad is the pain?" and "What does it keep you from doing?"  (e.g., Scale 1-10; mild, moderate, or severe)   - MILD (1-3): doesn't interfere with normal activities    - MODERATE (4-7): interferes with normal activities or awakens from sleep    - SEVERE (8-10): excruciating pain, unable to do any normal activities        7-8/10 5. RECURRENT SYMPTOM: "Have you ever had headaches before?" If so, ask: "When was the last time?" and "What happened that time?"      No 6. CAUSE: "What do you think is causing the headache?"     Maybe blood pressure 7. MIGRAINE: "Have you been diagnosed with migraine headaches?" If so, ask: "Is this headache similar?"      no 8. HEAD INJURY: "Has there been any recent injury to the head?"      no 9. OTHER SYMPTOMS: "Do you have any other symptoms?"  (fever, stiff neck, eye pain, sore throat, cold symptoms)     Headache worse lying down.  Dry mouth.  Some sinus tenderness  Protocols used: HEADACHE-A-AH

## 2017-10-28 NOTE — Progress Notes (Signed)
Subjective:  Patient ID: [name], female    DOB: 23-Apr-1940  Age: 77 y.o. MRN: [id number]  CC: Headache (patient is complaining of headache for 4 nights, painful behind eyes,forehead,back of the neck and sinus area and dry mouth. took tylenol.  FYI: lost 5 lbs since Saturday. )   Headache   This is a new problem. The current episode started in the past 7 days. The problem occurs daily. The problem has been unchanged. The pain is located in the frontal, occipital and retro-orbital region. The pain does not radiate. The pain quality is not similar to prior headaches. The quality of the pain is described as aching and dull. Associated symptoms include neck pain. Pertinent negatives include no abdominal pain, abnormal behavior, anorexia, back pain, blurred vision, coughing, drainage, eye pain, eye redness, eye watering, fever, insomnia, loss of balance, nausea, numbness, phonophobia, photophobia, rhinorrhea, scalp tenderness, sinus pressure, sore throat, swollen glands, tingling, tinnitus, visual change, vomiting, weakness or weight loss. Nothing aggravates the symptoms. She has tried acetaminophen for the symptoms. The treatment provided mild relief. Her past medical history is significant for hypertension and obesity. There is no history of migraine headaches, recent head traumas or sinus disease.    BP at home: 173/93 at bedtime with headache. 45 old Rx of clonidine.  Glucose this morning of 115 (fasting).  Reviewed past Medical, Social and Family history today.  Outpatient Medications Prior to Visit  Medication Sig Dispense Refill  . ACCU-CHEK AVIVA PLUS test strip TEST ONCE A DAY AS DIRECTED 100 each PRN  . aspirin 81 MG tablet Take 1 tablet (81 mg total) by mouth daily. Resume 4 days post-op    . Aspirin Buf,CaCarb-MgCarb-MgO, (BUFFERIN LOW DOSE) 81 MG TABS 81 mg once daily.    . Cholecalciferol (VITAMIN D) 2000 units CAPS Take 1 capsule by mouth daily.    . colesevelam (WELCHOL)  625 MG tablet Take 4 tablets (2,500 mg total) by mouth daily. 360 tablet 3  . DOCOSAHEXAENOIC ACID PO 1,200 mg Two (2) times a day.    Marland Kitchen glipiZIDE (GLUCOTROL XL) 2.5 MG 24 hr tablet TAKE 1 TABLET BY MOUTH  DAILY WITH BREAKFAST 90 tablet 1  . ibuprofen (ADVIL,MOTRIN) 200 MG tablet Take 1 tablet (200 mg total) by mouth every 6 (six) hours as needed for mild pain or moderate pain. Resume 5 days post-op as needed  0  . JANUVIA 100 MG tablet TAKE 1 TABLET BY MOUTH  DAILY 90 tablet 1  . Lancet Device MISC 100 each by Does not apply route daily. UAD for daily glucose monitoring 100 each PRN  . Lancets Misc. (ACCU-CHEK MULTICLIX LANCET DEV) KIT Use as directed to check sugars    . levothyroxine (SYNTHROID, LEVOTHROID) 75 MCG tablet Take 75 mcg by mouth daily.    Marland Kitchen losartan (COZAAR) 25 MG tablet TAKE 1 TABLET BY MOUTH EVERY DAY(PT DUE TO SCHEDULE YEARLY APPOINTMENT) 90 tablet 1  . Magnesium Cl-Calcium Carbonate (SLOW MAGNESIUM/CALCIUM PO) Take 1 tablet by mouth daily.    . metFORMIN (GLUCOPHAGE) 1000 MG tablet 1 tab by mouth every morning, 1/2 tab by mouth at lunch, 1 tab by mouth at bedtime 180 tablet 1  . nitroGLYCERIN (NITROSTAT) 0.4 MG SL tablet Place 1 tablet (0.4 mg total) under the tongue every 5 (five) minutes as needed for chest pain. 25 tablet 3  . Omega-3 Fatty Acids (OMEGA-3 FISH OIL) 1200 MG CAPS 1,200 mg Two (2) times a day.    . pantoprazole (  PROTONIX) 40 MG tablet TAKE 1 TABLET BY MOUTH  DAILY 90 tablet 3  . PARoxetine (PAXIL) 20 MG tablet Take 1 tablet (20 mg total) by mouth every morning. 90 tablet 1  . valACYclovir (VALTREX) 1000 MG tablet TAKE 1 TABLET BY MOUTH 2  TIMES DAILY AS NEEDED FOR  VIRAL INFECTION. 180 tablet 0  . vitamin B-12 (CYANOCOBALAMIN) 500 MCG tablet Take 500 mcg by mouth daily.     No facility-administered medications prior to visit.     ROS See HPI  Objective:  BP 126/66   Pulse 73   Temp 97.6 F (36.4 C) (Oral)   Ht '5\' 4"'  (1.626 m)   Wt 150 lb (68 kg)    SpO2 97%   BMI 25.75 kg/m   BP Readings from Last 3 Encounters:  10/28/17 126/66  09/10/17 128/66  06/19/17 128/68    Wt Readings from Last 3 Encounters:  10/28/17 150 lb (68 kg)  09/10/17 144 lb 3.2 oz (65.4 kg)  06/19/17 147 lb 3.2 oz (66.8 kg)    Physical Exam  Constitutional: She is oriented to person, place, and time. She appears well-developed and well-nourished.  Eyes: Pupils are equal, round, and reactive to light. EOM are normal. No scleral icterus.  Neck: Normal range of motion. Neck supple. No JVD present.  Cardiovascular: Normal rate and regular rhythm.  Murmur heard. Pulmonary/Chest: Effort normal and breath sounds normal. She has no rales.  Abdominal: Soft. Bowel sounds are normal. She exhibits no distension. There is no tenderness.  Musculoskeletal: She exhibits no edema.  Neurological: She is alert and oriented to person, place, and time.  Psychiatric: She has a normal mood and affect. Her behavior is normal.  Vitals reviewed.   Lab Results  Component Value Date   WBC 10.3 04/06/2016   HGB 9.9 (L) 04/06/2016   HCT 29.3 (L) 04/06/2016   PLT 226 04/06/2016   GLUCOSE 146 (H) 09/10/2017   CHOL 161 03/04/2017   TRIG 208.0 (H) 03/04/2017   HDL 27.70 (L) 03/04/2017   LDLDIRECT 102.0 03/04/2017   LDLCALC 83 05/13/2008   ALT 13 09/10/2017   AST 16 09/10/2017   NA 139 09/10/2017   K 4.9 09/10/2017   CL 104 09/10/2017   CREATININE 1.43 (H) 09/10/2017   BUN 22 09/10/2017   CO2 26 09/10/2017   TSH 3.60 06/03/2017   INR 0.88 09/10/2015   HGBA1C 7.2 (H) 09/10/2017   MICROALBUR 150 06/03/2017    Assessment & Plan:   Marcee was seen today for headache.  Diagnoses and all orders for this visit:  Acute intractable headache, unspecified headache type -     MR BRAIN WO CONTRAST; Future -     CBC w/Diff -     Sedimentation rate -     C-reactive protein  Type 2 diabetes mellitus with complication, without long-term current use of insulin (HCC) -     POCT  Glucose (CBG)   I am having [name] maintain her nitroGLYCERIN, aspirin, ibuprofen, Magnesium Cl-Calcium Carbonate (SLOW MAGNESIUM/CALCIUM PO), levothyroxine, Lancet Device, metFORMIN, valACYclovir, losartan, Aspirin Buf(CaCarb-MgCarb-MgO), DOCOSAHEXAENOIC ACID PO, ACCU-CHEK MULTICLIX LANCET DEV, Omega-3 Fish Oil, PARoxetine, colesevelam, Vitamin D, vitamin B-12, pantoprazole, JANUVIA, glipiZIDE, and ACCU-CHEK AVIVA PLUS.  No orders of the defined types were placed in this encounter.   Follow-up: No follow-ups on file.  Wilfred Lacy, NP

## 2017-10-31 ENCOUNTER — Other Ambulatory Visit: Payer: Medicare Other

## 2017-11-05 ENCOUNTER — Ambulatory Visit
Admission: RE | Admit: 2017-11-05 | Discharge: 2017-11-05 | Disposition: A | Discharge status: Home / Self Care | Payer: Medicare Other | Source: Ambulatory Visit | Attending: Nurse Practitioner | Admitting: Nurse Practitioner

## 2017-11-05 DIAGNOSIS — R51 Headache: Principal | ICD-10-CM

## 2017-11-05 DIAGNOSIS — R519 Headache, unspecified: Secondary | ICD-10-CM

## 2017-11-12 ENCOUNTER — Ambulatory Visit (INDEPENDENT_AMBULATORY_CARE_PROVIDER_SITE_OTHER): Payer: Medicare Other | Admitting: Family Medicine

## 2017-11-12 ENCOUNTER — Encounter: Payer: Self-pay | Admitting: Family Medicine

## 2017-11-12 VITALS — BP 146/74 | HR 113 | Temp 99.0°F | Ht 64.0 in | Wt 146.8 lb

## 2017-11-12 DIAGNOSIS — D509 Iron deficiency anemia, unspecified: Secondary | ICD-10-CM | POA: Diagnosis not present

## 2017-11-12 DIAGNOSIS — R519 Headache, unspecified: Secondary | ICD-10-CM

## 2017-11-12 DIAGNOSIS — I10 Essential (primary) hypertension: Secondary | ICD-10-CM

## 2017-11-12 DIAGNOSIS — R51 Headache: Secondary | ICD-10-CM | POA: Diagnosis not present

## 2017-11-12 LAB — FOLATE: FOLATE: 17.5 ng/mL (ref >5.9)

## 2017-11-12 LAB — CBC WITH DIFFERENTIAL/PLATELET
BASOS ABS: 0.1 10*3/uL (ref 0.0–0.1)
BASOS PCT: 0.8 % (ref 0.0–3.0)
EOS ABS: 0.3 10*3/uL (ref 0.0–0.7)
Eosinophils Relative: 4 % (ref 0.0–5.0)
HEMATOCRIT: 32.1 % — AB (ref 36.0–46.0)
Hemoglobin: 10.3 g/dL — ABNORMAL LOW (ref 12.0–15.0)
Lymphocytes Relative: 31 % (ref 12.0–46.0)
Lymphs Abs: 2.4 10*3/uL (ref 0.7–4.0)
MCHC: 32.2 g/dL (ref 30.0–36.0)
MCV: 80.3 fl (ref 78.0–100.0)
Monocytes Absolute: 0.8 10*3/uL (ref 0.1–1.0)
Monocytes Relative: 9.6 % (ref 3.0–12.0)
Neutro Abs: 4.3 10*3/uL (ref 1.4–7.7)
Neutrophils Relative %: 54.6 % (ref 43.0–77.0)
Platelets: 341 10*3/uL (ref 150.0–400.0)
RBC: 3.99 Mil/uL (ref 3.87–5.11)
RDW: 16 % — AB (ref 11.5–15.5)
WBC: 7.9 10*3/uL (ref 4.0–10.5)

## 2017-11-12 LAB — VITAMIN B12: VITAMIN B 12: 512 pg/mL (ref 211–911)

## 2017-11-12 LAB — FERRITIN: Ferritin: 9.7 ng/mL — ABNORMAL LOW (ref 10.0–291.0)

## 2017-11-12 NOTE — Assessment & Plan Note (Signed)
New- She has not noticed any bleeding, black or bloody stool.  No longer has a uterus so has not had any post menopausal bleeding.  Neg colonoscopy in 2015 but this anemia, along with unintended weight loss are concerning.  Will check additional anemia labs today and refer to GI for further work up- ?endoscopy/colonoscopy. The patient indicates understanding of these issues and agrees with the plan. Orders Placed This Encounter  Procedures  . CBC with Differential/Platelet  . Ferritin  . B12  . Folate  . Iron Binding Cap (TIBC)([facility])  . Iron,Total/Total Iron Binding Cap  . Ambulatory referral to Gastroenterology

## 2017-11-12 NOTE — Progress Notes (Signed)
Subjective:   Patient ID: [name], female    DOB: 04/02/1940, 77 y.o.   MRN: [id number]  [name] is a pleasant 77 y.o. year old female who presents to clinic today with Follow-up  on 11/12/2017  HPI:  Here to follow up from her appointment with Charlottte Nche on 10/28/2017. Note reviewed.  At that [place], she complained of one week of:  Intermittent HA-  located in the frontal, occipital and retro-orbital region. The pain does not radiate. Headaches are achy and dull, not sharp.  She has no associated nausea, vomiting, photophobia or phonophobia. She has tried acetaminophen for the symptoms. Tylenol does provide some relief.     MRI of brain, CBC, SED rate, CRP checked.   Mr Brain Wo Contrast  Result Date: 11/05/2017 CLINICAL DATA:  Initial evaluation for intermittent headaches for 2 weeks. EXAM: MRI HEAD WITHOUT CONTRAST TECHNIQUE: Multiplanar, multiecho pulse sequences of the brain and surrounding structures were obtained without intravenous contrast. COMPARISON:  None. FINDINGS: Brain: Cerebral volume within normal limits for age. Mild chronic microvascular ischemic changes present within the periventricular white matter as well as the pons. No acute or subacute infarct. Gray-white matter differentiation maintained. No encephalomalacia to suggest chronic infarction. No foci of susceptibility artifact to suggest acute or chronic intracranial hemorrhage. No mass lesion, midline shift or mass effect. No hydrocephalus. No extra-axial fluid collection. Pituitary gland normal. Vascular: Major intracranial vascular flow voids are maintained. Diminutive vertebrobasilar system noted. Skull and upper cervical spine: Craniocervical junction within normal limits. Bone marrow signal intensity normal. No scalp soft tissue abnormality. Sinuses/Orbits: Globes and orbital soft tissues within normal limits. Paranasal sinuses are clear. Trace opacity left mastoid air cells, of doubtful  significance. Inner ear structures normal. Other: None. IMPRESSION: 1. No acute intracranial abnormality. 2. Mild chronic microvascular ischemic disease for age. Electronically Signed   By: Jeannine Boga M.D.   On: 11/05/2017 19:04    She did go to her eye doctor last Thursday- was told eye were healthy.  Headaches are bout the same- pressure behind both eyes and now more in the back of her neck- she is under a lot of stress and does have some seasonal allergies.  Not taking anything for her PND, congestion.  No sinus pressure, tooth pain, cough, fever or other sinusitis symptoms.  She has not had a headache in a few days.  Anemia- new finding. Chart reviewed extensively.  H/H were low post op last year after her hysterectomy and BSO (actually received a blood transfusion) but prior to that, hemoglobin was 12.0 and HCT was 36.9. Lab Results  Component Value Date   WBC 7.4 10/28/2017   HGB 9.0 (L) 10/28/2017   HCT 27.8 (L) 10/28/2017   MCV 79.7 10/28/2017   PLT 201.0 10/28/2017  Denies black or bloody stools, has been fatigued.  Colonoscopy  Neg on 02/09/13. Endoscopy 09/27/15- results reviewed.  Has had unintentional weight loss in the past couple of weeks.    Wt Readings from Last 3 Encounters:  11/12/17 146 lb 12.8 oz (66.6 kg)  10/28/17 150 lb (68 kg)  09/10/17 144 lb 3.2 oz (65.4 kg)    Not taking any NSAIDs.  She does take ASA 81 mg daily but this is not new.  Current Outpatient Medications on File Prior to Visit  Medication Sig Dispense Refill  . ACCU-CHEK AVIVA PLUS test strip TEST ONCE A DAY AS DIRECTED 100 each PRN  . aspirin 81 MG tablet Take 1  tablet (81 mg total) by mouth daily. Resume 4 days post-op    . Aspirin Buf,CaCarb-MgCarb-MgO, (BUFFERIN LOW DOSE) 81 MG TABS 81 mg once daily.    . Cholecalciferol (VITAMIN D) 2000 units CAPS Take 1 capsule by mouth daily.    . colesevelam (WELCHOL) 625 MG tablet Take 4 tablets (2,500 mg total) by mouth daily. 360 tablet 3  .  glipiZIDE (GLUCOTROL XL) 2.5 MG 24 hr tablet TAKE 1 TABLET BY MOUTH  DAILY WITH BREAKFAST 90 tablet 1  . ibuprofen (ADVIL,MOTRIN) 200 MG tablet Take 1 tablet (200 mg total) by mouth every 6 (six) hours as needed for mild pain or moderate pain. Resume 5 days post-op as needed  0  . JANUVIA 100 MG tablet TAKE 1 TABLET BY MOUTH  DAILY 90 tablet 1  . Lancet Device MISC 100 each by Does not apply route daily. UAD for daily glucose monitoring 100 each PRN  . Lancets Misc. (ACCU-CHEK MULTICLIX LANCET DEV) KIT Use as directed to check sugars    . levothyroxine (SYNTHROID, LEVOTHROID) 75 MCG tablet Take 75 mcg by mouth daily.    Marland Kitchen losartan (COZAAR) 25 MG tablet TAKE 1 TABLET BY MOUTH EVERY DAY(PT DUE TO SCHEDULE YEARLY APPOINTMENT) 90 tablet 1  . Magnesium Cl-Calcium Carbonate (SLOW MAGNESIUM/CALCIUM PO) Take 1 tablet by mouth daily.    . metFORMIN (GLUCOPHAGE) 1000 MG tablet 1 tab by mouth every morning, 1/2 tab by mouth at lunch, 1 tab by mouth at bedtime (Patient taking differently: Take 1/2 tablet tid) 180 tablet 1  . nitroGLYCERIN (NITROSTAT) 0.4 MG SL tablet Place 1 tablet (0.4 mg total) under the tongue every 5 (five) minutes as needed for chest pain. 25 tablet 3  . Omega-3 Fatty Acids (OMEGA-3 FISH OIL) 1200 MG CAPS 1,200 mg Two (2) times a day.    . pantoprazole (PROTONIX) 40 MG tablet TAKE 1 TABLET BY MOUTH  DAILY 90 tablet 3  . PARoxetine (PAXIL) 20 MG tablet Take 1 tablet (20 mg total) by mouth every morning. 90 tablet 1  . valACYclovir (VALTREX) 1000 MG tablet TAKE 1 TABLET BY MOUTH 2  TIMES DAILY AS NEEDED FOR  VIRAL INFECTION. 180 tablet 0  . vitamin B-12 (CYANOCOBALAMIN) 500 MCG tablet Take 500 mcg by mouth daily.    . DOCOSAHEXAENOIC ACID PO 1,200 mg Two (2) times a day.     No current facility-administered medications on file prior to visit.     Allergies  Allergen Reactions  . Codeine Nausea And Vomiting and Other (See Comments)    Severe stomach cramps  . Antihistamines,  Diphenhydramine-Type Other (See Comments)    Causes hyperactivity  . Gabapentin     Urinary incontinence  . Statins Other (See Comments)    Joint pains  . Erythromycin Hives and Rash    Due to dental work about 50 years ago. Was used in packing and resulted in rash/hives inside and outside of mouth.    Past Medical History:  Diagnosis Date  . Anemia    childhood  . Anxiety   . Arterial stenosis (HCC)    mesenteric  . Arthritis   . Breast cancer ([place]) 12/05/10   R breast, inv mammary, in situ,ER/PR +,HER2 -  . Cancer ([place])   . Carcinoma of breast treated with adjuvant chemotherapy ([place])   . Coronary artery disease    stent 2007  . Depression   . Diabetes mellitus   . Difficulty sleeping   . Diverticulosis 12/10/03  . Elevated  cholesterol   . Generalized weakness   . GERD (gastroesophageal reflux disease)   . Heart murmur    secondary to chem. 2013  . Hepatitis    as an infant  . HSV-1 (herpes simplex virus 1) infection   . Hyperlipidemia   . Hypertension   . Hypothyroidism   . Osteoarthritis   . Personal history of chemotherapy   . Personal history of radiation therapy   . Pneumonia    2014 september  . 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 Spinal stenosis   . Thyroid disease     Past Surgical History:  Procedure Laterality Date  . ANTERIOR AND POSTERIOR REPAIR N/A 04/05/2016   Procedure: ANTERIOR (CYSTOCELE) AND POSTERIOR REPAIR (RECTOCELE);  Surgeon: [name], MD;  Location: [facility];  Service: Gynecology;  Laterality: N/A;  . BREAST LUMPECTOMY  1983   benign biopsy  . BREAST LUMPECTOMY Right 12/29/2010   snbx, ER/PR +, Her2 -, 0/1 node pos.  . CAROTID STENT  2007  . CARPAL TUNNEL RELEASE Bilateral 9/12,8,12  . CHOLECYSTECTOMY    . COLONOSCOPY N/A 02/09/2013   Procedure: COLONOSCOPY;  Surgeon: [name], MD;  Location: [facility];  Service: Endoscopy;  Laterality: N/A;  . CORONARY ANGIOPLASTY    . DILATION AND CURETTAGE OF  UTERUS    . ESOPHAGOGASTRODUODENOSCOPY N/A 02/09/2013   Procedure: ESOPHAGOGASTRODUODENOSCOPY (EGD);  Surgeon: [name], MD;  Location: [name];  Service: Endoscopy;  Laterality: N/A;  . FOOT SPURS    . HYSTEROSCOPY W/D&C N/A 09/11/2012   Procedure: DILATATION AND CURETTAGE ;  Surgeon: [name], MD;  Location: [facility];  Service: Gynecology;  Laterality: N/A;  . KNEE SURGERY  2007  . LAPAROSCOPIC ASSISTED VAGINAL HYSTERECTOMY N/A 04/05/2016   Procedure: LAPAROSCOPIC ASSISTED VAGINAL HYSTERECTOMY possible BSO;  Surgeon: [name], MD;  Location: [facility];  Service: Gynecology;  Laterality: N/A;  . LUMBAR LAMINECTOMY/DECOMPRESSION MICRODISCECTOMY N/A 10/20/2014   Procedure: MICRO LUMBER DECOMPRESSION L3-4 L4-5;  Surgeon: [name], MD;  Location: [facility];  Service: Orthopedics;  Laterality: N/A;  . LUMBAR LAMINECTOMY/DECOMPRESSION MICRODISCECTOMY Bilateral 10/13/2015   Procedure: MICRO LUMBAR DECOMPRESSION L5 - S1 AND REDO DECOMPRESSION L4 - L5 AND REMOVAL OF FACET CYST L4 - L5 2 LEVELS;  Surgeon: [name], MD;  Location: [facility];  Service: Orthopedics;  Laterality: Bilateral;  . PORT-A-CATH REMOVAL  04/17/2011   Procedure: REMOVAL PORT-A-CATH;  Surgeon: [name], MD;  Location: [facility];  Service: General;  Laterality: Left;  . PORTACATH PLACEMENT  02/07/2011   Procedure: INSERTION PORT-A-CATH;  Surgeon: [name], MD;  Location: [facility];  Service: General;  Laterality: N/A;    Family History  Problem Relation Age of Onset  . Throat cancer Father   . Alcoholism Father   . Heart attack Father   . Heart disease Mother   . Lymphoma Brother 21  . Cancer Son   . Heart attack Brother   . Diabetes Brother   . Hypertension Brother   . Colon cancer Neg Hx   . Stroke Neg Hx     Social History   Socioeconomic History  . Marital status: Married    Spouse name: Not on file  . Number of children: 2  . Years of education: Not on file  .  Highest education level: Not on file  Occupational History  . Occupation: retired  Scientific laboratory technician  . Financial resource strain: Not on file  . Food insecurity:  Worry: Not on file    Inability: Not on file  . Transportation needs:    Medical: Not on file    Non-medical: Not on file  Tobacco Use  . Smoking status: Former Smoker    Packs/day: 2.00    Years: 40.00    Pack years: 80.00    Last attempt to quit: 12/19/1997    Years since quitting: 19.9  . Smokeless tobacco: Never Used  Substance and Sexual Activity  . Alcohol use: Yes    Alcohol/week: 6.0 standard drinks    Types: 6 Glasses of wine per week    Comment: occasional  . Drug use: No  . Sexual activity: Not on file    Comment: menarch age 77, P59, HRT X 10 YRS, MENOPAUSE MID 40'S  Lifestyle  . Physical activity:    Days per week: Not on file    Minutes per session: Not on file  . Stress: Not on file  Relationships  . Social connections:    Talks on phone: Not on file    Gets together: Not on file    Attends religious service: Not on file    Active member of club or organization: Not on file    Attends meetings of clubs or organizations: Not on file    Relationship status: Not on file  . Intimate partner violence:    Fear of current or ex partner: Not on file    Emotionally abused: Not on file    Physically abused: Not on file    Forced sexual activity: Not on file  Other Topics Concern  . Not on file  Social History Narrative   [name] age 40- [place] age 52  [place]   The PMH, [place], Social History, Family History, Medications, and allergies have been reviewed in [facility], and have been updated if relevant.   Review of Systems  Constitutional: Positive for fatigue and unexpected weight change. Negative for appetite change.  HENT: Positive for congestion, postnasal drip and rhinorrhea. Negative for dental problem, drooling, ear discharge, ear pain, facial swelling, hearing loss, mouth sores,  nosebleeds, sinus pressure, sinus pain, sneezing, sore throat, tinnitus, trouble swallowing and voice change.   Eyes: Negative.   Respiratory: Positive for shortness of breath.   Cardiovascular: Negative.   Gastrointestinal: Negative.   Endocrine: Negative.   Genitourinary: Negative.   Musculoskeletal: Positive for neck pain.  Allergic/Immunologic: Negative.   Neurological: Positive for headaches. Negative for dizziness, tremors, seizures, syncope, facial asymmetry, speech difficulty, weakness, light-headedness and numbness.  Hematological: Negative.   Psychiatric/Behavioral: Negative.   All other systems reviewed and are negative.      Objective:    BP (!) 146/74 (BP Location: Left Arm, Cuff Size: Normal)   Pulse (!) 113   Temp 99 F (37.2 C) (Oral)   Ht '5\' 4"'  (1.626 m)   Wt 146 lb 12.8 oz (66.6 kg)   SpO2 97%   BMI 25.20 kg/m   BP Readings from Last 3 Encounters:  11/12/17 (!) 146/74  10/28/17 126/66  09/10/17 128/66     Physical Exam  Constitutional: She is oriented to person, place, and time. She appears well-developed and well-nourished. No distress.  HENT:  Head: Normocephalic and atraumatic.  Eyes: EOM are normal.  Neck: Normal range of motion.  Cardiovascular: Regular rhythm.  Pulmonary/Chest: Effort normal and breath sounds normal.  Abdominal: Soft. Bowel sounds are normal.  Musculoskeletal: Normal range of motion.       Arms: Neurological: She  is alert and oriented to person, place, and time. No cranial nerve deficit. Coordination normal.  Skin: Skin is warm and dry. She is not diaphoretic.  Psychiatric: She has a normal mood and affect. Her behavior is normal. Judgment and thought content normal.  Nursing note and vitals reviewed.         Assessment & Plan:   Essential hypertension  Chronic nonintractable headache, unspecified headache type  Microcytic anemia - Plan: CBC with Differential/Platelet, Ferritin, B12, Folate, Iron Binding Cap  (TIBC)([facility]), Ambulatory referral to Gastroenterology, Iron,Total/Total Iron Binding Cap Return in about 1 month (around 12/13/2017) for 30 minute follow up.

## 2017-11-12 NOTE — Assessment & Plan Note (Signed)
Likely multifactorial- do not seem migrainous in nature. ? Tension (unlikely occipital neuralgia as they are dull) and also sinus headaches. Advised starting antihistamine without a decongestant and flonase. She has been under more stress at home but those stressors are improving. Advised to avoid NSAIDs, take tylenol as needed and to keep a headache journal. She will make a 1 month follow up appointment with me on her way out today.

## 2017-11-12 NOTE — Patient Instructions (Signed)
Great to see you. I will call you with your lab results from today and you can view them online.   We are referring you to GI.  Try taking Zyrtec and flonase daily (OTC) for no more than two weeks.  I want you to keep a headache journal.

## 2017-11-13 ENCOUNTER — Telehealth: Payer: Self-pay | Admitting: Family Medicine

## 2017-11-13 LAB — IRON, TOTAL/TOTAL IRON BINDING CAP
%SAT: 8 % — AB (ref 16–45)
Iron: 38 ug/dL — ABNORMAL LOW (ref 45–160)
TIBC: 495 ug/dL — AB (ref 250–450)

## 2017-11-13 MED ORDER — FERROUS SULFATE 325 (65 FE) MG PO TABS: 325.0000 mg | ORAL_TABLET | Freq: Every day | ORAL | 3 refills | Status: DC

## 2017-11-13 NOTE — Telephone Encounter (Signed)
See result note.  

## 2017-11-13 NOTE — Telephone Encounter (Signed)
Copied from [place] [id number]. Topic: General - Other >> Nov 13, 2017  9:07 AM Lennox Solders wrote: Reason for CRM: pt saw dr Deborra Medina yesterday and calling with a questions . Pt said dr Deborra Medina she was very very anemic. Pt would like to know where doctor got the information from

## 2017-11-13 NOTE — Telephone Encounter (Signed)
I left a voicemail for patient to call back for her lab results, please see result note encounter.

## 2017-11-13 NOTE — Telephone Encounter (Signed)
Just finished her result note- please refer to result note as I think that answers her questions.  If not, please let me know!

## 2017-12-02 ENCOUNTER — Other Ambulatory Visit (HOSPITAL_COMMUNITY): Payer: Medicare Other

## 2017-12-05 ENCOUNTER — Ambulatory Visit: Payer: Medicare Other | Admitting: Physician Assistant

## 2017-12-06 ENCOUNTER — Other Ambulatory Visit: Payer: Self-pay | Admitting: Family Medicine

## 2017-12-08 ENCOUNTER — Other Ambulatory Visit: Payer: Self-pay | Admitting: Family Medicine

## 2017-12-11 ENCOUNTER — Telehealth: Payer: Self-pay

## 2017-12-11 ENCOUNTER — Other Ambulatory Visit: Payer: Self-pay | Admitting: Cardiovascular Disease

## 2017-12-11 ENCOUNTER — Other Ambulatory Visit: Payer: Self-pay

## 2017-12-11 ENCOUNTER — Ambulatory Visit (HOSPITAL_COMMUNITY): Payer: Medicare Other | Attending: Cardiology

## 2017-12-11 ENCOUNTER — Ambulatory Visit: Payer: Medicare Other | Admitting: Family Medicine

## 2017-12-11 ENCOUNTER — Other Ambulatory Visit (HOSPITAL_COMMUNITY): Payer: Self-pay | Admitting: Cardiovascular Disease

## 2017-12-11 DIAGNOSIS — I350 Nonrheumatic aortic (valve) stenosis: Secondary | ICD-10-CM | POA: Diagnosis not present

## 2017-12-11 NOTE — Telephone Encounter (Signed)
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

## 2017-12-11 NOTE — Telephone Encounter (Signed)
Patient aware of echo results. Per Dr. Johnsie Cancel, AS mild to moderate stable f/u echo in a  Year EF normal. Patient verbalized understanding. Will place order for echo in one year.

## 2017-12-12 ENCOUNTER — Encounter: Payer: Self-pay | Admitting: Gastroenterology

## 2017-12-12 ENCOUNTER — Ambulatory Visit (INDEPENDENT_AMBULATORY_CARE_PROVIDER_SITE_OTHER): Payer: Medicare Other | Admitting: Gastroenterology

## 2017-12-12 ENCOUNTER — Ambulatory Visit: Payer: Medicare Other | Admitting: Gastroenterology

## 2017-12-12 VITALS — BP 146/68 | HR 76 | Ht 63.0 in | Wt 148.1 lb

## 2017-12-12 DIAGNOSIS — K219 Gastro-esophageal reflux disease without esophagitis: Secondary | ICD-10-CM

## 2017-12-12 DIAGNOSIS — D509 Iron deficiency anemia, unspecified: Secondary | ICD-10-CM | POA: Diagnosis not present

## 2017-12-12 DIAGNOSIS — R109 Unspecified abdominal pain: Secondary | ICD-10-CM | POA: Diagnosis not present

## 2017-12-12 MED ORDER — SUPREP BOWEL PREP KIT 17.5-3.13-1.6 GM/177ML PO SOLN: ORAL | 0 refills | Status: DC

## 2017-12-12 NOTE — Progress Notes (Signed)
HPI :  77 year old female with a history of reflux and dysphagia, here for follow-up visit for iron deficiency anemia.  Patient was noted on labs early October with a hemoglobin of 9.0 with a microcytosis. Follow-up labs later October showed hemoglobin 10.3, MCV 80, ferritin 9.7, iron 38, iron sat of 8% . On review of labs her hemoglobin was 9.9 last year. She denies any blood in her stools. She says she has mostly normal bowel habits, rarely occasional constipation or loose stool, but nothing out of the ordinary. She denies any abdominal pains in her lower abdomen. She has had some intermittent epigastric discomfort after eating for the past few years. She takes Tums for this and states it helps, often bothers her at night. She denies any dysphagia. She had an endoscopy with me a little over 2 years ago, her esophagus was normal, she had an empiric dilation with 18 mm savory and that resolved her dysphagia. She reports that has not recurred the procedure. She has been taking Protonix 40 mg once daily. She takes an aspirin 81 mg a day but denies any other routine NSAID use. She has been placed on iron supplements and she was found to be iron deficient and states her stools are now dark due to that. Her last colonoscopy was in 01/2013 which did not show any polyps.   Of note she had an echocardiogram done yesterday which showed an EF of 55-60% with stable aortic stenosis.  EGD 09/27/2015 - normal esophagus, empiric dilation to 19m biopsies taken to rule out EoE, normal stomach / duodenum - nornal esophageal biopsies Colonoscopy 02/09/2013 - mild diverticulosis, small hemorrhoids EGD 02/09/2013 - normal esophagus and GEJ  Echo 12/12/17 - EF 55-60%, aortic stenosis - moderate   Past Medical History:  Diagnosis Date  . Anemia    childhood  . Anxiety   . Arterial stenosis (HCC)    mesenteric  . Arthritis   . [place]   R breast, inv mammary, in situ,ER/PR +,HER2 -  . Cancer  ([facility]   . Carcinoma of breast [place] with adjuvant chemotherapy ([place]   . Coronary artery disease    stent 2007  . Depression   . Diabetes mellitus   . Difficulty sleeping   . Diverticulosis 12/10/03  . Elevated cholesterol   . Generalized weakness   . GERD (gastroesophageal reflux disease)   . Heart murmur    secondary to chem. 2013  . Hepatitis    as an infant  . HSV-1 (herpes simplex virus 1) infection   . Hyperlipidemia   . Hypertension   . Hypothyroidism   . Osteoarthritis   . Personal history of chemotherapy   . Personal history of radiation therapy   . Pneumonia    2014 september  . 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   Dr JJuanita Craver . Spinal stenosis   . Thyroid disease      Past Surgical History:  Procedure Laterality Date  . ANTERIOR AND POSTERIOR REPAIR N/A 04/05/2016   Procedure: ANTERIOR (CYSTOCELE) AND POSTERIOR REPAIR (RECTOCELE);  Surgeon: GMarylynn Pearson MD;  [facility];  Service: Gynecology;  Laterality: N/A;  . BREAST LUMPECTOMY  1983   benign biopsy  . BREAST LUMPECTOMY Right 12/29/2010   snbx, ER/PR +, Her2 -, 0/1 node pos.  . CAROTID STENT  2007  . CARPAL TUNNEL RELEASE Bilateral 9/12,8,12  . CHOLECYSTECTOMY    . COLONOSCOPY N/A 02/09/2013   Procedure: COLONOSCOPY;  Surgeon: DLowella Bandy  Olevia Perches, MD;  Location: [name];  Service: Endoscopy;  Laterality: N/A;  . CORONARY ANGIOPLASTY    . DILATION AND CURETTAGE OF UTERUS    . ESOPHAGOGASTRODUODENOSCOPY N/A 02/09/2013   Procedure: ESOPHAGOGASTRODUODENOSCOPY (EGD);  Surgeon: [name], MD;  Location: [name];  Service: Endoscopy;  Laterality: N/A;  . FOOT SPURS    . HYSTEROSCOPY W/D&C N/A 09/11/2012   Procedure: DILATATION AND CURETTAGE ;  Surgeon: [name], MD;  Location: [facility];  Service: Gynecology;  Laterality: N/A;  . KNEE SURGERY  2007  . LAPAROSCOPIC ASSISTED VAGINAL HYSTERECTOMY N/A 04/05/2016   Procedure: LAPAROSCOPIC ASSISTED VAGINAL HYSTERECTOMY possible BSO;  Surgeon:  [name], MD;  Location: [facility];  Service: Gynecology;  Laterality: N/A;  . LUMBAR LAMINECTOMY/DECOMPRESSION MICRODISCECTOMY N/A 10/20/2014   Procedure: MICRO LUMBER DECOMPRESSION L3-4 L4-5;  Surgeon: [name], MD;  Location: [facility];  Service: Orthopedics;  Laterality: N/A;  . LUMBAR LAMINECTOMY/DECOMPRESSION MICRODISCECTOMY Bilateral 10/13/2015   Procedure: MICRO LUMBAR DECOMPRESSION L5 - S1 AND REDO DECOMPRESSION L4 - L5 AND REMOVAL OF FACET CYST L4 - L5 2 LEVELS;  Surgeon: [name], MD;  Location: [facility];  Service: Orthopedics;  Laterality: Bilateral;  . PORT-A-CATH REMOVAL  04/17/2011   Procedure: REMOVAL PORT-A-CATH;  Surgeon: [name], MD;  Location: [facility];  Service: General;  Laterality: Left;  . PORTACATH PLACEMENT  02/07/2011   Procedure: INSERTION PORT-A-CATH;  Surgeon: [name], MD;  Location: [facility];  Service: General;  Laterality: N/A;   Family History  Problem Relation Age of Onset  . Throat cancer Father   . Alcoholism Father   . Heart attack Father   . Heart disease Mother   . Lymphoma Brother 73  . Cancer Son   . Heart attack Brother   . Diabetes Brother   . Hypertension Brother   . Colon cancer Neg Hx   . Stroke Neg Hx    Social History   Tobacco Use  . Smoking status: Former Smoker    Packs/day: 2.00    Years: 40.00    Pack years: 80.00    Last attempt to quit: 12/19/1997    Years since quitting: 19.9  . Smokeless tobacco: Never Used  Substance Use Topics  . Alcohol use: Yes    Alcohol/week: 6.0 standard drinks    Types: 6 Glasses of wine per week    Comment: occasional  . Drug use: No   Current Outpatient Medications  Medication Sig Dispense Refill  . ACCU-CHEK AVIVA PLUS test strip TEST ONCE A DAY AS DIRECTED 100 each PRN  . aspirin 81 MG tablet Take 1 tablet (81 mg total) by mouth daily. Resume 4 days post-op    . Aspirin Buf,CaCarb-MgCarb-MgO, (BUFFERIN LOW DOSE) 81 MG TABS 81 mg once daily.    .  Cholecalciferol (VITAMIN D) 2000 units CAPS Take 1 capsule by mouth daily.    . colesevelam (WELCHOL) 625 MG tablet Take 4 tablets (2,500 mg total) by mouth daily. 360 tablet 3  . DOCOSAHEXAENOIC ACID PO 1,200 mg Two (2) times a day.    . ferrous sulfate (FERROUSUL) 325 (65 FE) MG tablet Take 1 tablet (325 mg total) by mouth daily with breakfast. 30 tablet 3  . glipiZIDE (GLUCOTROL XL) 2.5 MG 24 hr tablet TAKE 1 TABLET BY MOUTH  DAILY WITH BREAKFAST 90 tablet 1  . ibuprofen (ADVIL,MOTRIN) 200 MG tablet Take 1 tablet (200 mg total) by mouth every 6 (six) hours as needed for mild pain or  moderate pain. Resume 5 days post-op as needed  0  . JANUVIA 100 MG tablet TAKE 1 TABLET BY MOUTH  DAILY 90 tablet 1  . Lancet Device MISC 100 each by Does not apply route daily. UAD for daily glucose monitoring 100 each PRN  . Lancets Misc. (ACCU-CHEK MULTICLIX LANCET DEV) KIT Use as directed to check sugars    . levothyroxine (SYNTHROID, LEVOTHROID) 75 MCG tablet TAKE 1 TABLET BY MOUTH ONCE DAILY AT 6 AM 90 tablet 0  . losartan (COZAAR) 25 MG tablet TAKE 1 TABLET BY MOUTH EVERY DAY(PT DUE TO SCHEDULE YEARLY APPOINTMENT) 90 tablet 1  . Magnesium Cl-Calcium Carbonate (SLOW MAGNESIUM/CALCIUM PO) Take 1 tablet by mouth daily.    . metFORMIN (GLUCOPHAGE) 1000 MG tablet 1 tab by mouth every morning, 1/2 tab by mouth at lunch, 1 tab by mouth at bedtime (Patient taking differently: Take 1/2 tablet tid) 180 tablet 1  . nitroGLYCERIN (NITROSTAT) 0.4 MG SL tablet Place 1 tablet (0.4 mg total) under the tongue every 5 (five) minutes as needed for chest pain. 25 tablet 3  . Omega-3 Fatty Acids (OMEGA-3 FISH OIL) 1200 MG CAPS 1,200 mg Two (2) times a day.    . pantoprazole (PROTONIX) 40 MG tablet TAKE 1 TABLET BY MOUTH  DAILY 90 tablet 3  . PARoxetine (PAXIL) 20 MG tablet TAKE 1 TABLET BY MOUTH EVERY DAY IN THE MORNING 90 tablet 1  . valACYclovir (VALTREX) 1000 MG tablet TAKE 1 TABLET BY MOUTH 2  TIMES DAILY AS NEEDED FOR  VIRAL  INFECTION. 180 tablet 0  . vitamin B-12 (CYANOCOBALAMIN) 500 MCG tablet Take 500 mcg by mouth daily.     No current facility-administered medications for this visit.    Allergies  Allergen Reactions  . Codeine Nausea And Vomiting and Other (See Comments)    Severe stomach cramps  . Antihistamines, Diphenhydramine-Type Other (See Comments)    Causes hyperactivity  . Gabapentin     Urinary incontinence  . Statins Other (See Comments)    Joint pains  . Erythromycin Hives and Rash    Due to dental work about 50 years ago. Was used in packing and resulted in rash/hives inside and outside of mouth.     Review of Systems: All systems reviewed and negative except where noted in HPI.   Lab Results  Component Value Date   WBC 7.9 11/12/2017   HGB 10.3 (L) 11/12/2017   HCT 32.1 (L) 11/12/2017   MCV 80.3 11/12/2017   PLT 341.0 11/12/2017    Lab Results  Component Value Date   CREATININE 1.43 (H) 09/10/2017   BUN 22 09/10/2017   NA 139 09/10/2017   K 4.9 09/10/2017   CL 104 09/10/2017   CO2 26 09/10/2017    Lab Results  Component Value Date   ALT 13 09/10/2017   AST 16 09/10/2017   ALKPHOS 58 09/10/2017   BILITOT 0.5 09/10/2017   Lab Results  Component Value Date   IRON 38 (L) 11/12/2017   TIBC 495 (H) 11/12/2017   FERRITIN 9.7 (L) 11/12/2017    CBC Latest Ref Rng & Units 11/12/2017 10/28/2017 04/06/2016  WBC 4.0 - 10.5 K/uL 7.9 7.4 10.3  Hemoglobin 12.0 - 15.0 g/dL 10.3(L) 9.0(L) 9.9(L)  Hematocrit 36.0 - 46.0 % 32.1(L) 27.8(L) 29.3(L)  Platelets 150.0 - 400.0 K/uL 341.0 201.0 226     Physical Exam: BP (!) 146/68   Pulse 76   Ht '5\' 3"'  (1.6 m)   Wt 148 lb  2 oz (67.2 kg)   BMI 26.24 kg/m  Constitutional: Pleasant,well-developed, female in no acute distress. HEENT: Normocephalic and atraumatic. Conjunctivae are normal. No scleral icterus. Neck supple.  Cardiovascular: Normal rate, regular rhythm.  Pulmonary/chest: Effort normal and breath sounds normal. No  wheezing, rales or rhonchi. Abdominal: Soft, nondistended, nontender.  There are no masses palpable. No hepatomegaly. Extremities: no edema Lymphadenopathy: No cervical adenopathy noted. Neurological: Alert and oriented to person place and time. Skin: Skin is warm and dry. No rashes noted. Psychiatric: Normal mood and affect. Behavior is normal.   ASSESSMENT AND PLAN: 77 year old female here for reassessment of the following issues:  Iron deficiency anemia / GERD / abdominal discomfort - patient with iron deficiency anemia noted on recent labs, although her anemia dates back to last year for what I can see. She denies any overt bleeding symptoms as above. She does have some occasional postprandial upper abdominal discomfort which is been stable over time. Her endoscopy 2 years ago was fairly unremarkable. I discussed differential diagnosis for iron deficiency anemia with her. In light of her upper abdominal symptoms I think repeating an EGD is appropriate to ensure no interval changes. I also recommend colonoscopy given her last exam was almost 5 years ago. I discussed risks and benefits of endoscopy and anesthesia with her and she wanted to proceed. I recommend she try increasing her Protonix to twice daily until the procedures and see if that helps her abdominal symptoms. Otherwise she'll continue her oral iron supplementation. Further recommendations pending the results. She agreed with the plan  Miller Cellar, MD [facility]

## 2017-12-12 NOTE — Patient Instructions (Signed)
If you are age 75 or older, your body mass index should be between 23-30. Your Body mass index is 26.24 kg/m. If this is out of the aforementioned range listed, please consider follow up with your Primary Care Provider.  If you are age 86 or younger, your body mass index should be between 19-25. Your Body mass index is 26.24 kg/m. If this is out of the aformentioned range listed, please consider follow up with your Primary Care Provider.   You have been scheduled for an endoscopy and colonoscopy. Please follow the written instructions given to you at your visit today. Please pick up your prep supplies at the pharmacy within the next 1-3 days. If you use inhalers (even only as needed), please bring them with you on the day of your procedure. Your physician has requested that you go to www.startemmi.com and enter the access code given to you at your visit today. This web site gives a general overview about your procedure. However, you should still follow specific instructions given to you by our office regarding your preparation for the procedure.  Continue your iron.  Increase Protonix to twice a day.  Thank you for entrusting me with your care and for choosing [facility], Dr. [address] Cellar

## 2017-12-13 NOTE — Progress Notes (Signed)
Subjective:   Patient ID: [name], female    DOB: 17-Nov-1940, 77 y.o.   MRN: [id number]  [name] is a pleasant 77 y.o. year old female who presents to clinic today with Follow-up (Headache has improved-she thinks it was due to stress. She is scheduled for an Upper Endoscopy and Colonoscopy on 01/20/18 at [place]. Has ocassional reflux. Doing well. )  on 12/16/2017  HPI:  Here for follow up. Established care with me in 02/2017.  Chart reviewed. Doing better- headaches have resolved.  Less fatigued, blood pressures at home have improved.  Diabetes- Does not check FSBS regularly. Currently taking Metformin 1000 every morning,500 mg at lunch, and 1000 mg at bedtime, Januvia 100 mg daily and glipizide 2.5 mg daily. Lab Results  Component Value Date   HGBA1C 7.2 (H) 09/10/2017   She also does have CKD and some peripheral neuropathy likely due to her DM. Vit B12 was normal.  We discussed rxs On 09/10/17, we did discuss neuropathy treatment.  She could not tolerate Gabapentin in the past.  We discussed a trial of elavil or Lyrica, but she deferred tx at the time.  Lab Results  Component Value Date   CREATININE 1.43 (H) 09/10/2017   Lab Results  Component Value Date   VITAMINB12 512 11/12/2017    Hypothyroidism has been clinically euthyroid on synthroid 75 mcg daily. Lab Results  Component Value Date   TSH 3.60 06/03/2017    Iron deficiency anemia- I referred her back to GI given microcytic anemia with hemoglobin of 9.0 in 10/2017.  Follow up labs later that month, showed hemoglobin 10.3, MCV 80, ferritin 9.7, iron 38 and iron sat of 8%.  Hemoglobin was 9.9 last year. Denied blood in her stool but has had intermittent post prandial epigastric discomfort.  She had an endoscopy done by Dr. Enis Gash over 2 years ago- did have an esophageal dilatation at that time that resolved her dysphagia that that time.  She has been taking Protonix 40 mg daily and I placed her  on Iron supplement on 11/13/17.   She denies routine NSAID use.  Last colonoscopy was in 01/2013 and did not show polyps.  Of note, recent echocardiogram showed- Echo 12/12/17 - EF 55-60%, aortic stenosis - moderate  Saw Dr. Enis Gash on 12/12/17.  Note reviewed.  He did feel that a repeat EGD was warranted given her intermittent postprandial discomfort.  He also suggest repeat colonoscopy since last colonoscopy was almost 5 year ago.  He also increased her Protonix to 40 mg twice daily and advised to continue taking the oral iron supplement that we started her on last month.  She has both scheduled for 01/20/18. Current Outpatient Medications on File Prior to Visit  Medication Sig Dispense Refill  . ACCU-CHEK AVIVA PLUS test strip TEST ONCE A DAY AS DIRECTED 100 each PRN  . aspirin 81 MG tablet Take 1 tablet (81 mg total) by mouth daily. Resume 4 days post-op    . Cholecalciferol (VITAMIN D) 2000 units CAPS Take 1 capsule by mouth daily.    . colesevelam (WELCHOL) 625 MG tablet Take 4 tablets (2,500 mg total) by mouth daily. 360 tablet 3  . DOCOSAHEXAENOIC ACID PO 1,200 mg Two (2) times a day.    . ferrous sulfate (FERROUSUL) 325 (65 FE) MG tablet Take 1 tablet (325 mg total) by mouth daily with breakfast. 30 tablet 3  . glipiZIDE (GLUCOTROL XL) 2.5 MG 24 hr tablet TAKE 1 TABLET BY  MOUTH  DAILY WITH BREAKFAST 90 tablet 1  . ibuprofen (ADVIL,MOTRIN) 200 MG tablet Take 1 tablet (200 mg total) by mouth every 6 (six) hours as needed for mild pain or moderate pain. Resume 5 days post-op as needed  0  . JANUVIA 100 MG tablet TAKE 1 TABLET BY MOUTH  DAILY 90 tablet 1  . Lancet Device MISC 100 each by Does not apply route daily. UAD for daily glucose monitoring 100 each PRN  . Lancets Misc. (ACCU-CHEK MULTICLIX LANCET DEV) KIT Use as directed to check sugars    . levothyroxine (SYNTHROID, LEVOTHROID) 75 MCG tablet TAKE 1 TABLET BY MOUTH ONCE DAILY AT 6 AM 90 tablet 0  . losartan (COZAAR) 25 MG tablet  TAKE 1 TABLET BY MOUTH EVERY DAY(PT DUE TO SCHEDULE YEARLY APPOINTMENT) 90 tablet 1  . Magnesium Cl-Calcium Carbonate (SLOW MAGNESIUM/CALCIUM PO) Take 1 tablet by mouth daily.    . metFORMIN (GLUCOPHAGE) 1000 MG tablet 1 tab by mouth every morning, 1/2 tab by mouth at lunch, 1 tab by mouth at bedtime (Patient taking differently: Take 1/2 tablet tid) 180 tablet 1  . nitroGLYCERIN (NITROSTAT) 0.4 MG SL tablet Place 1 tablet (0.4 mg total) under the tongue every 5 (five) minutes as needed for chest pain. 25 tablet 3  . Omega-3 Fatty Acids (OMEGA-3 FISH OIL) 1200 MG CAPS 1,200 mg Two (2) times a day.    . pantoprazole (PROTONIX) 40 MG tablet TAKE 1 TABLET BY MOUTH  DAILY (Patient taking differently: 2 (two) times daily. ) 90 tablet 3  . PARoxetine (PAXIL) 20 MG tablet TAKE 1 TABLET BY MOUTH EVERY DAY IN THE MORNING 90 tablet 1  . SUPREP BOWEL PREP KIT 17.5-3.13-1.6 GM/177ML SOLN Suprep-Use as directed 354 mL 0  . valACYclovir (VALTREX) 1000 MG tablet TAKE 1 TABLET BY MOUTH 2  TIMES DAILY AS NEEDED FOR  VIRAL INFECTION. 180 tablet 0  . vitamin B-12 (CYANOCOBALAMIN) 500 MCG tablet Take 500 mcg by mouth daily.     No current facility-administered medications on file prior to visit.     Allergies  Allergen Reactions  . Codeine Nausea And Vomiting and Other (See Comments)    Severe stomach cramps  . Antihistamines, Diphenhydramine-Type Other (See Comments)    Causes hyperactivity  . Gabapentin     Urinary incontinence  . Statins Other (See Comments)    Joint pains  . Erythromycin Hives and Rash    Due to dental work about 50 years ago. Was used in packing and resulted in rash/hives inside and outside of mouth.    Past Medical History:  Diagnosis Date  . Anemia    childhood  . Anxiety   . Arterial stenosis (HCC)    mesenteric  . Arthritis   . Breast cancer ([facility]) 12/05/10   R breast, inv mammary, in situ,ER/PR +,HER2 -  . Cancer ([place])   . Carcinoma of breast treated with adjuvant  chemotherapy ([facility])   . Coronary artery disease    stent 2007  . Depression   . Diabetes mellitus   . Difficulty sleeping   . Diverticulosis 12/10/03  . Elevated cholesterol   . Generalized weakness   . GERD (gastroesophageal reflux disease)   . Heart murmur    secondary to chem. 2013  . Hepatitis    as an infant  . HSV-1 (herpes simplex virus 1) infection   . Hyperlipidemia   . Hypertension   . Hypothyroidism   . Osteoarthritis   . Personal history of  chemotherapy   . Personal history of radiation therapy   . Pneumonia    2014 september  . 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 Spinal stenosis   . Thyroid disease     Past Surgical History:  Procedure Laterality Date  . ANTERIOR AND POSTERIOR REPAIR N/A 04/05/2016   Procedure: ANTERIOR (CYSTOCELE) AND POSTERIOR REPAIR (RECTOCELE);  Surgeon: [name], MD;  Location: [facility];  Service: Gynecology;  Laterality: N/A;  . BREAST LUMPECTOMY  1983   benign biopsy  . BREAST LUMPECTOMY Right 12/29/2010   snbx, ER/PR +, Her2 -, 0/1 node pos.  . CAROTID STENT  2007  . CARPAL TUNNEL RELEASE Bilateral 9/12,8,12  . CHOLECYSTECTOMY    . COLONOSCOPY N/A 02/09/2013   Procedure: COLONOSCOPY;  Surgeon: [name], MD;  Location: [facility];  Service: Endoscopy;  Laterality: N/A;  . CORONARY ANGIOPLASTY    . DILATION AND CURETTAGE OF UTERUS    . ESOPHAGOGASTRODUODENOSCOPY N/A 02/09/2013   Procedure: ESOPHAGOGASTRODUODENOSCOPY (EGD);  Surgeon: [name], MD;  Location: [name];  Service: Endoscopy;  Laterality: N/A;  . FOOT SPURS    . HYSTEROSCOPY W/D&C N/A 09/11/2012   Procedure: DILATATION AND CURETTAGE ;  Surgeon: [name], MD;  Location: [facility];  Service: Gynecology;  Laterality: N/A;  . KNEE SURGERY  2007  . LAPAROSCOPIC ASSISTED VAGINAL HYSTERECTOMY N/A 04/05/2016   Procedure: LAPAROSCOPIC ASSISTED VAGINAL HYSTERECTOMY possible BSO;  Surgeon: [name], MD;  Location: [facility];  Service:  Gynecology;  Laterality: N/A;  . LUMBAR LAMINECTOMY/DECOMPRESSION MICRODISCECTOMY N/A 10/20/2014   Procedure: MICRO LUMBER DECOMPRESSION L3-4 L4-5;  Surgeon: [name], MD;  Location: [facility];  Service: Orthopedics;  Laterality: N/A;  . LUMBAR LAMINECTOMY/DECOMPRESSION MICRODISCECTOMY Bilateral 10/13/2015   Procedure: MICRO LUMBAR DECOMPRESSION L5 - S1 AND REDO DECOMPRESSION L4 - L5 AND REMOVAL OF FACET CYST L4 - L5 2 LEVELS;  Surgeon: [name], MD;  Location: [facility];  Service: Orthopedics;  Laterality: Bilateral;  . PORT-A-CATH REMOVAL  04/17/2011   Procedure: REMOVAL PORT-A-CATH;  Surgeon: [name], MD;  Location: [facility];  Service: General;  Laterality: Left;  . PORTACATH PLACEMENT  02/07/2011   Procedure: INSERTION PORT-A-CATH;  Surgeon: [name], MD;  Location: [facility];  Service: General;  Laterality: N/A;    Family History  Problem Relation Age of Onset  . Throat cancer Father   . Alcoholism Father   . Heart attack Father   . Heart disease Mother   . Lymphoma Brother 75  . Cancer Son   . Heart attack Brother   . Diabetes Brother   . Hypertension Brother   . Colon cancer Neg Hx   . Stroke Neg Hx     Social History   Socioeconomic History  . Marital status: Married    Spouse name: Not on file  . Number of children: 2  . Years of education: Not on file  . Highest education level: Not on file  Occupational History  . Occupation: retired  Scientific laboratory technician  . Financial resource strain: Not on file  . Food insecurity:    Worry: Not on file    Inability: Not on file  . Transportation needs:    Medical: Not on file    Non-medical: Not on file  Tobacco Use  . Smoking status: Former Smoker    Packs/day: 2.00    Years: 40.00    Pack years: 80.00    Last attempt to quit: 12/19/1997  Years since quitting: 20.0  . Smokeless tobacco: Never Used  Substance and Sexual Activity  . Alcohol use: Yes    Alcohol/week: 6.0 standard drinks     Types: 6 Glasses of wine per week    Comment: occasional  . Drug use: No  . Sexual activity: Not on file    Comment: menarch age 57, P80, HRT X 10 YRS, MENOPAUSE MID 40'S  Lifestyle  . Physical activity:    Days per week: Not on file    Minutes per session: Not on file  . Stress: Not on file  Relationships  . Social connections:    Talks on phone: Not on file    Gets together: Not on file    Attends religious service: Not on file    Active member of club or organization: Not on file    Attends meetings of clubs or organizations: Not on file    Relationship status: Not on file  . Intimate partner violence:    Fear of current or ex partner: Not on file    Emotionally abused: Not on file    Physically abused: Not on file    Forced sexual activity: Not on file  Other Topics Concern  . Not on file  Social History Narrative   [name] age 68- [place] age 48  [place]   The PMH, [place], Social History, Family History, Medications, and allergies have been reviewed in [facility], and have been updated if relevant.        Review of Systems  Constitutional: Negative.   HENT: Negative.   Eyes: Negative.   Respiratory: Negative.   Cardiovascular: Negative.   Gastrointestinal: Negative.   Endocrine: Negative.   Genitourinary: Negative.   Musculoskeletal: Negative.   Allergic/Immunologic: Negative.   Neurological: Negative.   Hematological: Negative.   Psychiatric/Behavioral: Negative.   All other systems reviewed and are negative.      Objective:    BP 130/72   Pulse 65   Temp 97.6 F (36.4 C) (Oral)   Ht _0  (1.6 m)   Wt 147 lb (66.7 kg)   SpO2 95%   BMI 26.04 kg/m    Physical Exam  General:  Well-developed,well-nourished,in no acute distress; alert,appropriate and cooperative throughout examination Head:  normocephalic and atraumatic.   Eyes:  vision grossly intact, PERRL Ears:  R ear normal and L ear normal externally, TMs clear bilaterally Nose:  no  external deformity.   Mouth:  good dentition.   Neck:  No deformities, masses, or tenderness noted..   Lungs:  Normal respiratory effort, chest expands symmetrically. Lungs are clear to auscultation, no crackles or wheezes. Heart:  Normal rate and regular rhythm, harsh blowing murmur Extremities:  No clubbing, cyanosis, edema, or deformity noted with normal full range of motion of all joints.   Neurologic:  alert & oriented X3 and gait normal.   Skin:  Intact without suspicious lesions or rashes Cervical Nodes:  No lymphadenopathy noted Axillary Nodes:  No palpable lymphadenopathy Psych:  Cognition and judgment appear intact. Alert and cooperative with normal attention span and concentration. No apparent delusions, illusions, hallucinations        Assessment & Plan:   Other iron deficiency anemia - Plan: CBC with Differential/Platelet  Type 2 diabetes mellitus with stage 3 chronic kidney disease, without long-term current use of insulin (HCC) - Plan: Hemoglobin A1c, Comprehensive metabolic panel, Lipid panel, TSH  Stage 3 chronic kidney disease (HCC)  Hypothyroidism, unspecified type  Neuropathy  Vitamin B12 deficiency  Vitamin D deficiency  Aortic valve stenosis, etiology of cardiac valve disease unspecified No follow-ups on file.

## 2017-12-16 ENCOUNTER — Ambulatory Visit: Payer: Medicare Other | Admitting: Family Medicine

## 2017-12-16 ENCOUNTER — Ambulatory Visit (INDEPENDENT_AMBULATORY_CARE_PROVIDER_SITE_OTHER): Payer: Medicare Other | Admitting: Family Medicine

## 2017-12-16 ENCOUNTER — Encounter: Payer: Self-pay | Admitting: Family Medicine

## 2017-12-16 VITALS — BP 130/72 | HR 65 | Temp 97.6°F | Ht 63.0 in | Wt 147.0 lb

## 2017-12-16 DIAGNOSIS — E039 Hypothyroidism, unspecified: Secondary | ICD-10-CM

## 2017-12-16 DIAGNOSIS — G629 Polyneuropathy, unspecified: Secondary | ICD-10-CM

## 2017-12-16 DIAGNOSIS — N183 Chronic kidney disease, stage 3 unspecified: Secondary | ICD-10-CM

## 2017-12-16 DIAGNOSIS — E7800 Pure hypercholesterolemia, unspecified: Secondary | ICD-10-CM

## 2017-12-16 DIAGNOSIS — I350 Nonrheumatic aortic (valve) stenosis: Secondary | ICD-10-CM

## 2017-12-16 DIAGNOSIS — K219 Gastro-esophageal reflux disease without esophagitis: Secondary | ICD-10-CM

## 2017-12-16 DIAGNOSIS — R51 Headache: Secondary | ICD-10-CM

## 2017-12-16 DIAGNOSIS — D508 Other iron deficiency anemias: Secondary | ICD-10-CM

## 2017-12-16 DIAGNOSIS — E1122 Type 2 diabetes mellitus with diabetic chronic kidney disease: Secondary | ICD-10-CM | POA: Diagnosis not present

## 2017-12-16 DIAGNOSIS — E559 Vitamin D deficiency, unspecified: Secondary | ICD-10-CM

## 2017-12-16 DIAGNOSIS — G8929 Other chronic pain: Secondary | ICD-10-CM

## 2017-12-16 DIAGNOSIS — E538 Deficiency of other specified B group vitamins: Secondary | ICD-10-CM

## 2017-12-16 LAB — CBC WITH DIFFERENTIAL/PLATELET
BASOS PCT: 1 % (ref 0.0–3.0)
Basophils Absolute: 0.1 10*3/uL (ref 0.0–0.1)
EOS PCT: 5.7 % — AB (ref 0.0–5.0)
Eosinophils Absolute: 0.5 10*3/uL (ref 0.0–0.7)
HEMATOCRIT: 34.9 % — AB (ref 36.0–46.0)
HEMOGLOBIN: 11.2 g/dL — AB (ref 12.0–15.0)
Lymphocytes Relative: 28.5 % (ref 12.0–46.0)
Lymphs Abs: 2.3 10*3/uL (ref 0.7–4.0)
MCHC: 32.2 g/dL (ref 30.0–36.0)
MCV: 81.7 fl (ref 78.0–100.0)
MONO ABS: 0.9 10*3/uL (ref 0.1–1.0)
Monocytes Relative: 11.3 % (ref 3.0–12.0)
NEUTROS ABS: 4.3 10*3/uL (ref 1.4–7.7)
Neutrophils Relative %: 53.5 % (ref 43.0–77.0)
PLATELETS: 282 10*3/uL (ref 150.0–400.0)
RBC: 4.27 Mil/uL (ref 3.87–5.11)
RDW: 18 % — AB (ref 11.5–15.5)
WBC: 8 10*3/uL (ref 4.0–10.5)

## 2017-12-16 LAB — HEMOGLOBIN A1C: HEMOGLOBIN A1C: 7.2 % — AB (ref 4.6–6.5)

## 2017-12-16 LAB — LIPID PANEL
CHOL/HDL RATIO: 6
CHOLESTEROL: 201 mg/dL — AB (ref 0–200)
HDL: 34.3 mg/dL — AB (ref >39.00)
NonHDL: 166.26
TRIGLYCERIDES: 256 mg/dL — AB (ref 0.0–149.0)
VLDL: 51.2 mg/dL — AB (ref 0.0–40.0)

## 2017-12-16 LAB — COMPREHENSIVE METABOLIC PANEL
ALT: 14 U/L (ref 0–35)
AST: 19 U/L (ref 0–37)
Albumin: 4 g/dL (ref 3.5–5.2)
Alkaline Phosphatase: 69 U/L (ref 39–117)
BUN: 27 mg/dL — ABNORMAL HIGH (ref 6–23)
CHLORIDE: 103 meq/L (ref 96–112)
CO2: 24 meq/L (ref 19–32)
Calcium: 9.3 mg/dL (ref 8.4–10.5)
Creatinine, Ser: 1.64 mg/dL — ABNORMAL HIGH (ref 0.40–1.20)
GFR: 32.45 mL/min — ABNORMAL LOW (ref >60.00)
Glucose, Bld: 184 mg/dL — ABNORMAL HIGH (ref 70–99)
POTASSIUM: 4.3 meq/L (ref 3.5–5.1)
SODIUM: 137 meq/L (ref 135–145)
Total Bilirubin: 0.3 mg/dL (ref 0.2–1.2)
Total Protein: 7 g/dL (ref 6.0–8.3)

## 2017-12-16 LAB — TSH: TSH: 3.12 u[IU]/mL (ref 0.35–4.50)

## 2017-12-16 LAB — LDL CHOLESTEROL, DIRECT: Direct LDL: 138 mg/dL

## 2017-12-16 MED ORDER — QUINOA KALE & HEMP PO LIQD: 1.0000 | Freq: Once | ORAL | Status: DC

## 2017-12-16 NOTE — Assessment & Plan Note (Signed)
Continue current rx.  Scheduled for endoscopy next month.

## 2017-12-16 NOTE — Assessment & Plan Note (Signed)
Has been clinically euthyroid.  Check labs today.  No changes made in current dose of synthroid.

## 2017-12-16 NOTE — Assessment & Plan Note (Signed)
Repeat Vit B12 today.

## 2017-12-16 NOTE — Assessment & Plan Note (Signed)
Repeat Vit D today.

## 2017-12-16 NOTE — Assessment & Plan Note (Signed)
Recent echo showing moderate AS, good EF.

## 2017-12-16 NOTE — Assessment & Plan Note (Addendum)
Repeat renal panel today. If GFR remains low, will discuss coming off of Metformin and start long acting insulin.

## 2017-12-16 NOTE — Assessment & Plan Note (Signed)
Resolved.  She will continue to keep headache and blood pressure journals if she gets them again.

## 2017-12-16 NOTE — Assessment & Plan Note (Signed)
Continue current dose of iron. Scheduled for colonoscopy/endoscopy next month.

## 2017-12-16 NOTE — Assessment & Plan Note (Signed)
Statin intolerant.  On welchol.

## 2017-12-16 NOTE — Patient Instructions (Signed)
Great to see you. I will call you with your lab results from today and you can view them online.  Have a Happy Thanksgiving!!

## 2017-12-16 NOTE — Assessment & Plan Note (Signed)
Has been well controlled but has also had some elevated readings of late. Due for a1c today.

## 2017-12-31 ENCOUNTER — Ambulatory Visit: Payer: Self-pay | Admitting: Family Medicine

## 2017-12-31 NOTE — Progress Notes (Unsigned)
Error

## 2018-01-02 ENCOUNTER — Encounter: Payer: Self-pay | Admitting: Family Medicine

## 2018-01-02 ENCOUNTER — Ambulatory Visit (INDEPENDENT_AMBULATORY_CARE_PROVIDER_SITE_OTHER): Payer: Medicare Other | Admitting: Family Medicine

## 2018-01-02 VITALS — BP 134/84 | HR 88 | Temp 97.5°F | Ht 63.0 in | Wt 147.2 lb

## 2018-01-02 DIAGNOSIS — E1122 Type 2 diabetes mellitus with diabetic chronic kidney disease: Secondary | ICD-10-CM

## 2018-01-02 DIAGNOSIS — N183 Chronic kidney disease, stage 3 unspecified: Secondary | ICD-10-CM

## 2018-01-02 DIAGNOSIS — I10 Essential (primary) hypertension: Secondary | ICD-10-CM | POA: Diagnosis not present

## 2018-01-02 DIAGNOSIS — E7800 Pure hypercholesterolemia, unspecified: Secondary | ICD-10-CM

## 2018-01-02 MED ORDER — GLIPIZIDE ER 5 MG PO TB24: 5.0000 mg | ORAL_TABLET | Freq: Every day | ORAL | 3 refills | Status: DC

## 2018-01-02 MED ORDER — METFORMIN HCL 500 MG PO TABS: 500.0000 mg | ORAL_TABLET | Freq: Every day | ORAL | 3 refills | Status: DC

## 2018-01-02 NOTE — Assessment & Plan Note (Signed)
>   40 minutes spent in face to face time with patient, >50% spent in counselling or coordination of care discussing DM, CKD and HLD. Decrease Metformin to 500 mg daily with breakfast- discussed nephrotoxicity of taking it at such high doses given her renal function.  Increase Glipizide to 5 mg XL daily.  She will check her FSBS more regularly and update me over the next few weeks.

## 2018-01-02 NOTE — Assessment & Plan Note (Signed)
At goal for diabetic- continue ARB.

## 2018-01-02 NOTE — Progress Notes (Signed)
 Subjective:   Patient ID: [name], female    DOB: 06/12/1940, 77 y.o.   MRN: [id number]  [name] is a pleasant 77 y.o. year old female who presents to clinic today with Diabetes (Patient is here today to discuss decreasing her metformin for DM.  Two-weeks-ago her A1C was 7.2 which was unchanged from 3-months-ago.  She currently takes Glipizide 2.5mg 1qam, Januvia 100mg 1qs, and Metformin 1k mg 1/2tid (instead of how prescribed as 1qam;1/2@noon;1qhs).)  on 01/02/2018  HPI:  Last saw patient on on 12/12/17- note reviewed.  Renal function had worsened- Cr 1.64, GFR 32.45.  I referred her to a nephrologist and asked her to come in to discuss decreasing her Metformin (had been taking 1000 mg every morning, 500 mg at lunch and 500 mg at supper).  Does not check FSBS regularly. Currently taking Metformin 500 mg three times daily, Januvia 100 mg daily and glipizide 2.5 mg daily.  Lab Results  Component Value Date   HGBA1C 7.2 (H) 12/16/2017   Lab Results  Component Value Date   CREATININE 1.64 (H) 12/16/2017   HLD- LDL was 138 which was not at goal for a diabetic.She takes Welchol but is intolerant to statins.  Lab Results  Component Value Date   CHOL 201 (H) 12/16/2017   HDL 34.30 (L) 12/16/2017   LDLCALC 83 05/13/2008   LDLDIRECT 138.0 12/16/2017   TRIG 256.0 (H) 12/16/2017   CHOLHDL 6 12/16/2017   The 10-year ASCVD risk score (Goff DC Jr., et al., 2013) is: 39.2%   Values used to calculate the score:     Age: 77 years     Sex: Female     Is Non-Hispanic African American: No     Diabetic: Yes     Tobacco smoker: No     Systolic Blood Pressure: 134 mmHg     Is BP treated: Yes     HDL Cholesterol: 34.3 mg/dL     Total Cholesterol: 201 mg/dL   Current Outpatient Medications on File Prior to Visit  Medication Sig Dispense Refill  . ACCU-CHEK AVIVA PLUS test strip TEST ONCE A DAY AS DIRECTED 100 each PRN  . aspirin 81 MG tablet Take 1 tablet (81 mg total) by  mouth daily. Resume 4 days post-op    . Cholecalciferol (VITAMIN D) 2000 units CAPS Take 1 capsule by mouth daily.    . colesevelam (WELCHOL) 625 MG tablet Take 4 tablets (2,500 mg total) by mouth daily. 360 tablet 3  . DOCOSAHEXAENOIC ACID PO 1,200 mg Two (2) times a day.    . ferrous sulfate (FERROUSUL) 325 (65 FE) MG tablet Take 1 tablet (325 mg total) by mouth daily with breakfast. 30 tablet 3  . JANUVIA 100 MG tablet TAKE 1 TABLET BY MOUTH  DAILY 90 tablet 1  . Lancet Device MISC 100 each by Does not apply route daily. UAD for daily glucose monitoring 100 each PRN  . Lancets Misc. (ACCU-CHEK MULTICLIX LANCET DEV) KIT Use as directed to check sugars    . levothyroxine (SYNTHROID, LEVOTHROID) 75 MCG tablet TAKE 1 TABLET BY MOUTH ONCE DAILY AT 6 AM 90 tablet 0  . losartan (COZAAR) 25 MG tablet TAKE 1 TABLET BY MOUTH EVERY DAY(PT DUE TO SCHEDULE YEARLY APPOINTMENT) 90 tablet 1  . Magnesium Cl-Calcium Carbonate (SLOW MAGNESIUM/CALCIUM PO) Take 1 tablet by mouth daily.    . nitroGLYCERIN (NITROSTAT) 0.4 MG SL tablet Place 1 tablet (0.4 mg total) under the tongue every 5 (  five) minutes as needed for chest pain. 25 tablet 3  . Omega-3 Fatty Acids (OMEGA-3 FISH OIL) 1200 MG CAPS 1,200 mg Two (2) times a day.    . pantoprazole (PROTONIX) 40 MG tablet TAKE 1 TABLET BY MOUTH  DAILY (Patient taking differently: 2 (two) times daily. ) 90 tablet 3  . PARoxetine (PAXIL) 20 MG tablet TAKE 1 TABLET BY MOUTH EVERY DAY IN THE MORNING 90 tablet 1  . SUPREP BOWEL PREP KIT 17.5-3.13-1.6 GM/177ML SOLN Suprep-Use as directed 354 mL 0  . valACYclovir (VALTREX) 1000 MG tablet TAKE 1 TABLET BY MOUTH 2  TIMES DAILY AS NEEDED FOR  VIRAL INFECTION. 180 tablet 0  . vitamin B-12 (CYANOCOBALAMIN) 500 MCG tablet Take 500 mcg by mouth daily.     No current facility-administered medications on file prior to visit.     Allergies  Allergen Reactions  . Codeine Nausea And Vomiting and Other (See Comments)    Severe stomach  cramps  . Antihistamines, Diphenhydramine-Type Other (See Comments)    Causes hyperactivity  . Gabapentin     Urinary incontinence  . Statins Other (See Comments)    Joint pains  . Erythromycin Hives and Rash    Due to dental work about 50 years ago. Was used in packing and resulted in rash/hives inside and outside of mouth.    Past Medical History:  Diagnosis Date  . Anemia    childhood  . Anxiety   . Arterial stenosis (HCC)    mesenteric  . Arthritis   . Breast cancer (HCC) 12/05/10   R breast, inv mammary, in situ,ER/PR +,HER2 -  . Cancer (HCC)   . Carcinoma of breast treated with adjuvant chemotherapy (HCC)   . Coronary artery disease    stent 2007  . Depression   . Diabetes mellitus   . Difficulty sleeping   . Diverticulosis 12/10/03  . Elevated cholesterol   . Generalized weakness   . GERD (gastroesophageal reflux disease)   . Heart murmur    secondary to chem. 2013  . Hepatitis    as an infant  . HSV-1 (herpes simplex virus 1) infection   . Hyperlipidemia   . Hypertension   . Hypothyroidism   . Osteoarthritis   . Personal history of chemotherapy   . Personal history of radiation therapy   . Pneumonia    2014 september  . 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 Spinal stenosis   . Thyroid disease     Past Surgical History:  Procedure Laterality Date  . ANTERIOR AND POSTERIOR REPAIR N/A 04/05/2016   Procedure: ANTERIOR (CYSTOCELE) AND POSTERIOR REPAIR (RECTOCELE);  Surgeon: Gretchen Adkins, MD;  Location: [facility];  Service: Gynecology;  Laterality: N/A;  . BREAST LUMPECTOMY  1983   benign biopsy  . BREAST LUMPECTOMY Right 12/29/2010   snbx, ER/PR +, Her2 -, 0/1 node pos.  . CAROTID STENT  2007  . CARPAL TUNNEL RELEASE Bilateral 9/12,8,12  . CHOLECYSTECTOMY    . COLONOSCOPY N/A 02/09/2013   Procedure: COLONOSCOPY;  Surgeon: Dora M Brodie, MD;  Location: [facility];  Service: Endoscopy;  Laterality: N/A;  . CORONARY ANGIOPLASTY    .  DILATION AND CURETTAGE OF UTERUS    . ESOPHAGOGASTRODUODENOSCOPY N/A 02/09/2013   Procedure: ESOPHAGOGASTRODUODENOSCOPY (EGD);  Surgeon: Dora M Brodie, MD;  Location: [facility];  Service: Endoscopy;  Laterality: N/A;  . FOOT SPURS    . HYSTEROSCOPY W/D&C N/A 09/11/2012   Procedure: DILATATION AND CURETTAGE ;    Surgeon: Gretchen Adkins, MD;  Location: [facility];  Service: Gynecology;  Laterality: N/A;  . KNEE SURGERY  2007  . LAPAROSCOPIC ASSISTED VAGINAL HYSTERECTOMY N/A 04/05/2016   Procedure: LAPAROSCOPIC ASSISTED VAGINAL HYSTERECTOMY possible BSO;  Surgeon: Gretchen Adkins, MD;  Location: [facility];  Service: Gynecology;  Laterality: N/A;  . LUMBAR LAMINECTOMY/DECOMPRESSION MICRODISCECTOMY N/A 10/20/2014   Procedure: MICRO LUMBER DECOMPRESSION L3-4 L4-5;  Surgeon: Jeffrey Beane, MD;  Location: [facility];  Service: Orthopedics;  Laterality: N/A;  . LUMBAR LAMINECTOMY/DECOMPRESSION MICRODISCECTOMY Bilateral 10/13/2015   Procedure: MICRO LUMBAR DECOMPRESSION L5 - S1 AND REDO DECOMPRESSION L4 - L5 AND REMOVAL OF FACET CYST L4 - L5 2 LEVELS;  Surgeon: Jeffrey Beane, MD;  Location: [facility];  Service: Orthopedics;  Laterality: Bilateral;  . PORT-A-CATH REMOVAL  04/17/2011   Procedure: REMOVAL PORT-A-CATH;  Surgeon: Matthew Wakefield, MD;  Location: [facility];  Service: General;  Laterality: Left;  . PORTACATH PLACEMENT  02/07/2011   Procedure: INSERTION PORT-A-CATH;  Surgeon: Matthew Wakefield, MD;  Location: [facility];  Service: General;  Laterality: N/A;    Family History  Problem Relation Age of Onset  . Throat cancer Father   . Alcoholism Father   . Heart attack Father   . Heart disease Mother   . Lymphoma Brother 60  . Cancer Son   . Heart attack Brother   . Diabetes Brother   . Hypertension Brother   . Colon cancer Neg Hx   . Stroke Neg Hx     Social History   Socioeconomic History  . Marital status: Married    Spouse name: Not on file  . Number of children: 2  . Years of  education: Not on file  . Highest education level: Not on file  Occupational History  . Occupation: retired  Social Needs  . Financial resource strain: Not on file  . Food insecurity:    Worry: Not on file    Inability: Not on file  . Transportation needs:    Medical: Not on file    Non-medical: Not on file  Tobacco Use  . Smoking status: Former Smoker    Packs/day: 2.00    Years: 40.00    Pack years: 80.00    Last attempt to quit: 12/19/1997    Years since quitting: 20.0  . Smokeless tobacco: Never Used  Substance and Sexual Activity  . Alcohol use: Yes    Alcohol/week: 6.0 standard drinks    Types: 6 Glasses of wine per week    Comment: occasional  . Drug use: No  . Sexual activity: Not on file    Comment: menarch age 12, P3, HRT X 10 YRS, MENOPAUSE MID 40'S  Lifestyle  . Physical activity:    Days per week: Not on file    Minutes per session: Not on file  . Stress: Not on file  Relationships  . Social connections:    Talks on phone: Not on file    Gets together: Not on file    Attends religious service: Not on file    Active member of club or organization: Not on file    Attends meetings of clubs or organizations: Not on file    Relationship status: Not on file  . Intimate partner violence:    Fear of current or ex partner: Not on file    Emotionally abused: Not on file    Physically abused: Not on file    Forced sexual activity: Not on file  Other Topics Concern  .   Not on file  Social History Narrative   Cathy age 45- [place]   Frankie age 38  [place]   The PMH, PSH, Social History, Family History, Medications, and allergies have been reviewed in [facility], and have been updated if relevant.   Review of Systems  Constitutional: Negative.   HENT: Negative.   Eyes: Negative.   Respiratory: Negative.   Cardiovascular: Negative.   Gastrointestinal: Negative.   Endocrine: Negative.   Genitourinary: Negative.   Musculoskeletal: Negative.     Allergic/Immunologic: Negative.   Neurological: Negative.   Hematological: Negative.   Psychiatric/Behavioral: Negative.   All other systems reviewed and are negative.      Objective:    BP 134/84 (BP Location: Left Arm, Patient Position: Sitting, Cuff Size: Normal)   Pulse 88   Temp (!) 97.5 F (36.4 C) (Oral)   Ht 5' 3" (1.6 m)   Wt 147 lb 3.2 oz (66.8 kg)   SpO2 97%   BMI 26.08 kg/m    Physical Exam Vitals signs and nursing note reviewed.  Constitutional:      General: She is not in acute distress.    Appearance: Normal appearance.  HENT:     Head: Normocephalic and atraumatic.     Nose: Nose normal.  Eyes:     Extraocular Movements: Extraocular movements intact.  Neck:     Musculoskeletal: Normal range of motion.  Cardiovascular:     Rate and Rhythm: Normal rate.  Pulmonary:     Effort: Pulmonary effort is normal.  Musculoskeletal: Normal range of motion.     Left lower leg: No edema.  Skin:    General: Skin is warm and dry.  Neurological:     General: No focal deficit present.     Mental Status: She is alert and oriented to person, place, and time.  Psychiatric:        Mood and Affect: Mood normal.        Behavior: Behavior normal.        Thought Content: Thought content normal.        Judgment: Judgment normal.           Assessment & Plan:   Type 2 diabetes mellitus with stage 3 chronic kidney disease, without long-term current use of insulin (HCC)  Stage 3 chronic kidney disease (HCC) - Plan: Ambulatory referral to Nephrology Return in about 3 months (around 04/03/2018) for medication follow up..  

## 2018-01-02 NOTE — Assessment & Plan Note (Signed)
Likely multifactorial DM, HTN, high doses of metformin with pre renal component as well, given that her BUN was elevated along with her Cr. Explained importance of drinking water.  She admits to "never drinking water." Advised NOT to take NSAIDs.   Metformin dose decreased.  She is taking an ARB. Awaiting appointment with nephrology (referral placed).

## 2018-01-02 NOTE — Patient Instructions (Addendum)
Great to see you. Merry Christmas.   We are decreasing your Metformin to 500 mg daily with breakfast and increasing your glipizide to 5 mg XL daily.  Continue Januvia.  Check your blood sugars about three times a week but also when you feel shaky for the next few weeks.  Please drink water!!!!! We are referring you to a kidney specialist.

## 2018-01-20 ENCOUNTER — Encounter: Payer: Self-pay | Admitting: Gastroenterology

## 2018-01-20 ENCOUNTER — Telehealth: Payer: Self-pay

## 2018-01-20 ENCOUNTER — Ambulatory Visit (AMBULATORY_SURGERY_CENTER): Payer: Medicare Other | Admitting: Gastroenterology

## 2018-01-20 VITALS — BP 135/40 | HR 82 | Temp 98.2°F | Resp 11 | Ht 63.0 in | Wt 148.0 lb

## 2018-01-20 DIAGNOSIS — D120 Benign neoplasm of cecum: Secondary | ICD-10-CM | POA: Diagnosis present

## 2018-01-20 DIAGNOSIS — R1013 Epigastric pain: Secondary | ICD-10-CM

## 2018-01-20 DIAGNOSIS — D123 Benign neoplasm of transverse colon: Secondary | ICD-10-CM

## 2018-01-20 DIAGNOSIS — K2950 Unspecified chronic gastritis without bleeding: Secondary | ICD-10-CM

## 2018-01-20 DIAGNOSIS — K2970 Gastritis, unspecified, without bleeding: Secondary | ICD-10-CM | POA: Diagnosis not present

## 2018-01-20 DIAGNOSIS — K5520 Angiodysplasia of colon without hemorrhage: Secondary | ICD-10-CM | POA: Diagnosis not present

## 2018-01-20 DIAGNOSIS — D122 Benign neoplasm of ascending colon: Secondary | ICD-10-CM | POA: Diagnosis not present

## 2018-01-20 DIAGNOSIS — D509 Iron deficiency anemia, unspecified: Secondary | ICD-10-CM

## 2018-01-20 MED ORDER — SODIUM CHLORIDE 0.9 % IV SOLN: 500.0000 mL | Freq: Once | INTRAVENOUS | Status: DC

## 2018-01-20 NOTE — Op Note (Signed)
[facility] Patient Name: [name] Procedure Date: 01/20/2018 8:00 AM MRN: [id number] Endoscopist: Remo Lipps P. Havery Moros , MD Age: 77 Referring MD:  Date of Birth: July 29, 1940 Gender: Female Account #: [id number] Procedure:                Upper GI endoscopy Indications:              Epigastric abdominal pain, Iron deficiency anemia -                            improved on twice daily PPI Medicines:                Monitored Anesthesia Care Procedure:                Pre-Anesthesia Assessment:                           - Prior to the procedure, a History and Physical                            was performed, and patient medications and                            allergies were reviewed. The patient's tolerance of                            previous anesthesia was also reviewed. The risks                            and benefits of the procedure and the sedation                            options and risks were discussed with the patient.                            All questions were answered, and informed consent                            was obtained. Prior Anticoagulants: The patient has                            taken no previous anticoagulant or antiplatelet                            agents. ASA Grade Assessment: III - A patient with                            severe systemic disease. After reviewing the risks                            and benefits, the patient was deemed in                            satisfactory condition to undergo the procedure.  After obtaining informed consent, the endoscope was                            passed under direct vision. Throughout the                            procedure, the patient's blood pressure, pulse, and                            oxygen saturations were monitored continuously. The                            Model GIF-[id number]) scope was introduced                            through the  mouth, and advanced to the second part                            of duodenum. The upper GI endoscopy was                            accomplished without difficulty. The patient                            tolerated the procedure well. Scope In: Scope Out: Findings:                 Esophagogastric landmarks were identified: the                            Z-line was found at 41 cm, the gastroesophageal                            junction was found at 41 cm and the upper extent of                            the gastric folds was found at 41 cm from the                            incisors.                           The exam of the esophagus was otherwise normal.                           The entire examined stomach was normal. Biopsies                            were taken with a cold forceps for Helicobacter                            pylori testing.                           The duodenal bulb and second  portion of the                            duodenum were normal. Complications:            No immediate complications. Estimated blood loss:                            Minimal. Estimated Blood Loss:     Estimated blood loss was minimal. Impression:               - Esophagogastric landmarks identified.                           - Normal esophagus.                           - Normal stomach. Biopsied to rule out H pylori                            given anemia and epigastric discomfort.                           - Normal duodenal bulb and second portion of the                            duodenum. Recommendation:           - Patient has a contact number available for                            emergencies. The signs and symptoms of potential                            delayed complications were discussed with the                            patient. Return to normal activities tomorrow.                            Written discharge instructions were provided to the                             patient.                           - Resume previous diet.                           - Continue present medications.                           - Await pathology results. Remo Lipps P. Armbruster, MD 01/20/2018 8:50:44 AM This report has been signed electronically.

## 2018-01-20 NOTE — Op Note (Signed)
[facility] Patient Name: [name] Procedure Date: 01/20/2018 8:00 AM MRN: [id number] Endoscopist: Remo Lipps P. Havery Moros , MD Age: 77 Referring MD:  Date of Birth: 11-02-1940 Gender: Female Account #: [id number] Procedure:                Colonoscopy Indications:              Iron deficiency anemia Medicines:                Monitored Anesthesia Care Procedure:                Pre-Anesthesia Assessment:                           - Prior to the procedure, a History and Physical                            was performed, and patient medications and                            allergies were reviewed. The patient's tolerance of                            previous anesthesia was also reviewed. The risks                            and benefits of the procedure and the sedation                            options and risks were discussed with the patient.                            All questions were answered, and informed consent                            was obtained. Prior Anticoagulants: The patient has                            taken no previous anticoagulant or antiplatelet                            agents. ASA Grade Assessment: III - A patient with                            severe systemic disease. After reviewing the risks                            and benefits, the patient was deemed in                            satisfactory condition to undergo the procedure.                           After obtaining informed consent, the colonoscope  was passed under direct vision. Throughout the                            procedure, the patient's blood pressure, pulse, and                            oxygen saturations were monitored continuously. The                            Colonoscope was introduced through the anus and                            advanced to the the terminal ileum, with                            identification of the appendiceal  orifice and IC                            valve. The colonoscopy was performed without                            difficulty. The patient tolerated the procedure                            well. The quality of the bowel preparation was                            good. The terminal ileum, ileocecal valve,                            appendiceal orifice, and rectum were photographed. Scope In: 8:18:29 AM Scope Out: 8:42:08 AM Scope Withdrawal Time: 0 hours 18 minutes 45 seconds  Total Procedure Duration: 0 hours 23 minutes 39 seconds  Findings:                 The perianal and digital rectal examinations were                            normal.                           The terminal ileum appeared normal.                           A single medium-sized angiodysplastic lesion                            without bleeding was found in the cecum.                           A diminutive polyp was found in the cecum. The                            polyp was sessile. The polyp was removed with a  cold biopsy forceps. Resection and retrieval were                            complete.                           Two sessile polyps were found in the ascending                            colon. The polyps were 3 to 6 mm in size. These                            polyps were removed with a cold snare. Resection                            and retrieval were complete.                           A 3 mm polyp was found in the transverse colon. The                            polyp was sessile. The polyp was removed with a                            cold snare. Resection and retrieval were complete.                           Multiple small-mouthed diverticula were found in                            the sigmoid colon.                           The exam was otherwise without abnormality. Complications:            No immediate complications. Estimated blood loss:                             Minimal. Estimated Blood Loss:     Estimated blood loss was minimal. Impression:               - The examined portion of the ileum was normal.                           - A single non-bleeding colonic angiodysplastic                            lesion.                           - One diminutive polyp in the cecum, removed with a                            cold biopsy forceps. Resected and retrieved.                           -  Two 3 to 6 mm polyps in the ascending colon,                            removed with a cold snare. Resected and retrieved.                           - One 3 mm polyp in the transverse colon, removed                            with a cold snare. Resected and retrieved.                           - Diverticulosis in the sigmoid colon.                           - The examination was otherwise normal.                           Overall, it's possible cecal AVM could be                            contributing to anemia, and raises possibility for                            small bowel AVMs. Recommendation:           - Patient has a contact number available for                            emergencies. The signs and symptoms of potential                            delayed complications were discussed with the                            patient. Return to normal activities tomorrow.                            Written discharge instructions were provided to the                            patient.                           - Resume previous diet.                           - Continue present medications.                           - Await pathology results.                           - Continue oral iron. Repeat CBC in 1 month Carlota Raspberry. Laila Myhre, MD 01/20/2018 8:47:34 AM This report has been signed electronically.

## 2018-01-20 NOTE — Patient Instructions (Signed)
YOU HAD AN ENDOSCOPIC PROCEDURE TODAY AT [facility]:   Refer to the procedure report that was given to you for any specific questions about what was found during the examination.  If the procedure report does not answer your questions, please call your gastroenterologist to clarify.  If you requested that your care partner not be given the details of your procedure findings, then the procedure report has been included in a sealed envelope for you to review at your convenience later.  YOU SHOULD EXPECT: Some feelings of bloating in the abdomen. Passage of more gas than usual.  Walking can help get rid of the air that was put into your GI tract during the procedure and reduce the bloating. If you had a lower endoscopy (such as a colonoscopy or flexible sigmoidoscopy) you may notice spotting of blood in your stool or on the toilet paper. If you underwent a bowel prep for your procedure, you may not have a normal bowel movement for a few days.  Please Note:  You might notice some irritation and congestion in your nose or some drainage.  This is from the oxygen used during your procedure.  There is no need for concern and it should clear up in a day or so.  SYMPTOMS TO REPORT IMMEDIATELY:   Following lower endoscopy (colonoscopy or flexible sigmoidoscopy):  Excessive amounts of blood in the stool  Significant tenderness or worsening of abdominal pains  Swelling of the abdomen that is new, acute  Fever of 100F or higher   Following upper endoscopy (EGD)  Vomiting of blood or coffee ground material  New chest pain or pain under the shoulder blades  Painful or persistently difficult swallowing  New shortness of breath  Fever of 100F or higher  Black, tarry-looking stools  For urgent or emergent issues, a gastroenterologist can be reached at any hour by calling [phone].   DIET:  We do recommend a small meal at first, but then you may proceed to your regular diet.  Drink  plenty of fluids but you should avoid alcoholic beverages for 24 hours.  ACTIVITY:  You should plan to take it easy for the rest of today and you should NOT DRIVE or use heavy machinery until tomorrow (because of the sedation medicines used during the test).    FOLLOW UP: Our staff will call the number listed on your records the next business day following your procedure to check on you and address any questions or concerns that you may have regarding the information given to you following your procedure. If we do not reach you, we will leave a message.  However, if you are feeling well and you are not experiencing any problems, there is no need to return our call.  We will assume that you have returned to your regular daily activities without incident.  If any biopsies were taken you will be contacted by phone or by letter within the next 1-3 weeks.  Please call us at [phone] if you have not heard about the biopsies in 3 weeks.   Await for biopsy results Polyps (handout given) Diverticulosis (handout given) Continue oral iron, Repeat CBC in one month   SIGNATURES/CONFIDENTIALITY: You and/or your care partner have signed paperwork which will be entered into your electronic medical record.  These signatures attest to the fact that that the information above on your After Visit Summary has been reviewed and is understood.  Full responsibility of the confidentiality of this discharge  information lies with you and/or your care-partner. 

## 2018-01-20 NOTE — Progress Notes (Signed)
Called to room to assist during endoscopic procedure.  Patient ID and intended procedure confirmed with present staff. Received instructions for my participation in the procedure from the performing physician.  

## 2018-01-20 NOTE — Telephone Encounter (Signed)
Copied from [place] [id number]. Topic: General - Other >> Jan 20, 2018 10:46 AM Virl Axe D wrote: Reason for CRM: pt stated she would like Dr. Deborra Medina to know she has not heard from the Kidney doctor and would like to know what the next steps are. Please advise.

## 2018-01-20 NOTE — Progress Notes (Signed)
Report given to PACU, vss 

## 2018-01-21 ENCOUNTER — Telehealth: Payer: Self-pay | Admitting: *Deleted

## 2018-01-21 NOTE — Telephone Encounter (Signed)
  Follow up Call-  Call back number 01/20/2018 09/27/2015  Post procedure Call Back phone  # [phone]  Permission to leave phone message Yes Yes  Some recent data might be hidden     Patient questions:  Do you have a fever, pain , or abdominal swelling? No. Pain Score  0 *  Have you tolerated food without any problems? Yes.    Have you been able to return to your normal activities? Yes.    Do you have any questions about your discharge instructions: Diet   No. Medications  No. Follow up visit  No.  Do you have questions or concerns about your Care? No.  Actions: * If pain score is 4 or above: No action needed, pain <4.

## 2018-01-23 ENCOUNTER — Other Ambulatory Visit: Payer: Self-pay

## 2018-01-23 DIAGNOSIS — D509 Iron deficiency anemia, unspecified: Secondary | ICD-10-CM

## 2018-01-23 NOTE — Telephone Encounter (Signed)
Can you guys check into this referral? Thanks!

## 2018-01-27 NOTE — Telephone Encounter (Signed)
I called and spoke to patient. Patient aware referral was faxed to kidney doctor - [facility] on 01/02/18 and could take 2 or more months for an appointment. Patient thanked me for calling.

## 2018-01-29 ENCOUNTER — Telehealth: Payer: Self-pay

## 2018-01-29 NOTE — Telephone Encounter (Signed)
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 Thanks

## 2018-01-29 NOTE — Telephone Encounter (Signed)
Called and relayed results and recommendations to patient. She expressed understanding. Will go to the lab this week or next.

## 2018-01-30 ENCOUNTER — Other Ambulatory Visit (INDEPENDENT_AMBULATORY_CARE_PROVIDER_SITE_OTHER): Payer: Medicare Other

## 2018-01-30 DIAGNOSIS — D509 Iron deficiency anemia, unspecified: Secondary | ICD-10-CM | POA: Diagnosis not present

## 2018-01-30 LAB — CBC WITH DIFFERENTIAL/PLATELET
BASOS PCT: 1 % (ref 0.0–3.0)
Basophils Absolute: 0.1 10*3/uL (ref 0.0–0.1)
Eosinophils Absolute: 0.4 10*3/uL (ref 0.0–0.7)
Eosinophils Relative: 4.2 % (ref 0.0–5.0)
HEMATOCRIT: 37.4 % (ref 36.0–46.0)
HEMOGLOBIN: 12.2 g/dL (ref 12.0–15.0)
LYMPHS PCT: 33.6 % (ref 12.0–46.0)
Lymphs Abs: 2.8 10*3/uL (ref 0.7–4.0)
MCHC: 32.6 g/dL (ref 30.0–36.0)
MCV: 83.7 fl (ref 78.0–100.0)
Monocytes Absolute: 0.9 10*3/uL (ref 0.1–1.0)
Monocytes Relative: 10.5 % (ref 3.0–12.0)
Neutro Abs: 4.2 10*3/uL (ref 1.4–7.7)
Neutrophils Relative %: 50.7 % (ref 43.0–77.0)
Platelets: 250 10*3/uL (ref 150.0–400.0)
RBC: 4.47 Mil/uL (ref 3.87–5.11)
RDW: 16.9 % — ABNORMAL HIGH (ref 11.5–15.5)
WBC: 8.4 10*3/uL (ref 4.0–10.5)

## 2018-01-31 LAB — HM DIABETES EYE EXAM

## 2018-02-02 ENCOUNTER — Other Ambulatory Visit: Payer: Self-pay | Admitting: Cardiovascular Disease

## 2018-02-10 ENCOUNTER — Other Ambulatory Visit: Payer: Self-pay | Admitting: Radiology

## 2018-02-10 DIAGNOSIS — Z853 Personal history of malignant neoplasm of breast: Secondary | ICD-10-CM

## 2018-02-14 ENCOUNTER — Ambulatory Visit: Admission: RE | Admit: 2018-02-14 | Payer: Medicare Other | Source: Ambulatory Visit

## 2018-02-14 ENCOUNTER — Ambulatory Visit
Admission: RE | Admit: 2018-02-14 | Discharge: 2018-02-14 | Disposition: A | Discharge status: Home / Self Care | Payer: Medicare Other | Source: Ambulatory Visit | Attending: Radiology | Admitting: Radiology

## 2018-02-14 DIAGNOSIS — Z853 Personal history of malignant neoplasm of breast: Secondary | ICD-10-CM

## 2018-02-19 ENCOUNTER — Other Ambulatory Visit: Payer: Self-pay | Admitting: Family Medicine

## 2018-02-19 ENCOUNTER — Encounter: Payer: Self-pay | Admitting: Family Medicine

## 2018-02-20 ENCOUNTER — Other Ambulatory Visit: Payer: Self-pay | Admitting: Family Medicine

## 2018-02-20 LAB — IRON,TIBC AND FERRITIN PANEL
%SAT: 22
Ferritin: 39
Iron: 84
TIBC: 388
UIBC: 304

## 2018-02-20 LAB — BASIC METABOLIC PANEL
BUN: 27 — AB (ref 4–21)
Creatinine: 1.5 — AB (ref 0.5–1.1)
POTASSIUM: 4.1 (ref 3.4–5.3)
Sodium: 138 (ref 137–147)

## 2018-02-20 LAB — CBC AND DIFFERENTIAL
HCT: 38 (ref 36–46)
Hemoglobin: 12.5 (ref 12.0–16.0)
Neutrophils Absolute: 3
Platelets: 259 (ref 150–399)
WBC: 7.2

## 2018-02-20 LAB — MICROALBUMIN, URINE: Microalb, Ur: 264

## 2018-02-28 ENCOUNTER — Telehealth: Payer: Self-pay | Admitting: Family Medicine

## 2018-02-28 NOTE — Telephone Encounter (Signed)
TA-Pt states that she saw kidney specialist a couple weeks ago/I asked about her sugars and she said "About 2-3 weeks ago they were running around 146.  All of a sudden they have been 200-205 consistently."/I verified that she is taking all of her medications correctly/plz advise/thx dmf

## 2018-02-28 NOTE — Telephone Encounter (Signed)
Copied from [place] [id number]. Topic: Quick Communication - See Telephone Encounter >> Feb 28, 2018 10:02 AM Vernona Rieger wrote: CRM for notification. See Telephone encounter for: 02/28/18.  Pt states her sugar readings are around 200 in the morning and in the afternoon. Does she need have some more changes with her diabetic medications?

## 2018-02-28 NOTE — Telephone Encounter (Signed)
Any recent steroids/knee injections/prednisone?

## 2018-02-28 NOTE — Telephone Encounter (Signed)
Dr. Aron - please advise 

## 2018-03-03 NOTE — Telephone Encounter (Signed)
Patient is requesting a call back ion regards to her sugar reading. Please advise

## 2018-03-04 MED ORDER — GLIPIZIDE ER 10 MG PO TB24: 10.0000 mg | ORAL_TABLET | Freq: Every day | ORAL | 0 refills | Status: DC

## 2018-03-04 NOTE — Telephone Encounter (Signed)
Are these fasting readings?

## 2018-03-04 NOTE — Telephone Encounter (Signed)
If she is not running any higher than the 200s, I would not add back the Metformin yet.  How have they been running recently?

## 2018-03-04 NOTE — Telephone Encounter (Signed)
Given her kidney function, I'd rather increase her glipizide although this can cause low blood sugars so I want her to watch out for that.  I have changed the dose on her med list of glipizide XL 10 mg daily with breakfast since she should still have plenty of 5 mg XL tablets at home (she can take two at one time to equal 10).  I actually would start out alternating 5 mg daily with breakfast one day, 10 mg daily with breakfast the next day, etc.... for a couple of weeks to make sure her blood sugar doesn't bottom out.  Has she seen the kidney doctor yet?

## 2018-03-04 NOTE — Telephone Encounter (Signed)
TA-Pt would like to know if you want her to take things differently? Add back half of a Metformin? She did have a steroid injection in her knee/she was very upset about it taking nearly a week to get a response/I assured her that this was not due to you in any means and I gave her my cell number to call as a 2nd option if this eve happened again/plz advise about what you would like to do concerning her BS? thx dmf

## 2018-03-04 NOTE — Telephone Encounter (Signed)
TA-The numbers are running between 200-225/plz advise/thx dmf

## 2018-03-04 NOTE — Telephone Encounter (Signed)
TA-Yes these are fasting readings/plz advise/thx dmf

## 2018-03-04 NOTE — Telephone Encounter (Signed)
Pt states she has not had any recent steroid/knee injections/prednisone. She has only used steroid cream.  Pt would like a call back to discuss her readings.

## 2018-03-05 NOTE — Telephone Encounter (Signed)
Pt aware and will start alternating 5mg /10mg  daily and keep a log of fbs and what dose took and pm bs for several days and will let us know/thx dmf

## 2018-03-05 NOTE — Telephone Encounter (Signed)
Spoke with spouse who said to call back in about an hour/thx dmf

## 2018-03-07 ENCOUNTER — Encounter: Payer: Self-pay | Admitting: Family Medicine

## 2018-03-07 NOTE — Progress Notes (Signed)
[facility]  

## 2018-03-13 ENCOUNTER — Ambulatory Visit: Payer: Self-pay | Admitting: Family Medicine

## 2018-03-13 NOTE — Telephone Encounter (Signed)
I accidentally signed this chart after attempting to return her call.   Message left for her to call us back.

## 2018-03-14 NOTE — Progress Notes (Signed)
Subjective:   Patient ID: [name], female    DOB: March 01, 1940, 78 y.o.   MRN: [id number]  PRICELLA GAUGH is a pleasant 78 y.o. year old female who presents to clinic today with Abnormal readings (Patient is here today C/O elevated BP and BS readings.  On 2.7.20 in response to her readings being consistently slightly over 200 fasting she was advised to take Glipizide 10m & 134malternating days. Did not restart Metformin as we need to check her kidney function first.  Sometimes she wakes with a H/A and will take her BP and it reads about 184/102 one morning.)  on 03/17/2018  HPI:  Patient called on 02/28/18 stating the her fasting FSBS readings have been running in 220s after receiving a steroid knee injection.  She asked if she should add back Metformin 500 mg with supper (decreased her Metformin from 1000 mg twice daily to 500 mg daily and increased her glipizide to 5 mg XL daily, continued her Januvia in 12/2017 due to decreasing kidney function).  I advised the following:  Given her kidney function, I'd rather increase her glipizide although this can cause low blood sugars so I want her to watch out for that.  I have changed the dose on her med list of glipizide XL 10 mg daily with breakfast since she should still have plenty of 5 mg XL tablets at home (she can take two at one time to equal 10).  I actually would start out alternating 5 mg daily with breakfast one day, 10 mg daily with breakfast the next day, etc.... for a couple of weeks to make sure her blood sugar doesn't bottom out.  Has she seen the kidney doctor yet?  Lab Results  Component Value Date   CREATININE 1.5 (A) 02/20/2018   Lab Results  Component Value Date   HGBA1C 7.2 (H) 12/16/2017      CKD III- I referred her to renal in 12/2017.  She was seen by Dr. UpHollie Salkn 02/28/18.  Note reviewed. She agreed that CKD is likely due to her DM and HTN and did not feel she needed a big renal work up. BP at that renal office  visit on 02/28/18 was 140/90.  Advised to continue ARB and to follow up in 6 months.  HTN- BP Readings from Last 3 Encounters:  03/17/18 (!) 150/84  01/20/18 (!) 135/40  01/02/18 134/84    Per pt, sometimes she wakes up with a HA .  One morning, she woke up with one and checked her BP and it was 184/102. Currently taking Cozaar 25 mg daily. Lab Results  Component Value Date   CREATININE 1.5 (A) 02/20/2018    Current Outpatient Medications on File Prior to Visit  Medication Sig Dispense Refill  . ACCU-CHEK AVIVA PLUS test strip TEST ONCE A DAY AS DIRECTED 100 each PRN  . aspirin 81 MG tablet Take 1 tablet (81 mg total) by mouth daily. Resume 4 days post-op    . Cholecalciferol (VITAMIN D) 2000 units CAPS Take 1 capsule by mouth daily.    . colesevelam (WELCHOL) 625 MG tablet Take 4 tablets (2,500 mg total) by mouth daily. 360 tablet 3  . DOCOSAHEXAENOIC ACID PO 1,200 mg Two (2) times a day.    . ferrous sulfate (FERROUSUL) 325 (65 FE) MG tablet Take 1 tablet (325 mg total) by mouth daily with breakfast. 30 tablet 3  . glipiZIDE (GLUCOTROL XL) 10 MG 24 hr tablet Take 1 tablet (  10 mg total) by mouth daily with breakfast. 30 tablet 0  . JANUVIA 100 MG tablet TAKE 1 TABLET BY MOUTH  DAILY 90 tablet 1  . Lancet Device MISC 100 each by Does not apply route daily. UAD for daily glucose monitoring 100 each PRN  . Lancets Misc. (ACCU-CHEK MULTICLIX LANCET DEV) KIT Use as directed to check sugars    . levothyroxine (SYNTHROID, LEVOTHROID) 75 MCG tablet TAKE 1 TABLET BY MOUTH ONCE DAILY AT 6 AM 90 tablet 0  . losartan (COZAAR) 25 MG tablet Take 1 tablet (25 mg total) by mouth daily. Please make yearly appt with Dr. Johnsie Cancel for May for future refills. 1st attempt 90 tablet 1  . Magnesium Cl-Calcium Carbonate (SLOW MAGNESIUM/CALCIUM PO) Take 1 tablet by mouth daily.    . metFORMIN (GLUCOPHAGE) 500 MG tablet Take 1 tablet (500 mg total) by mouth daily with breakfast. 90 tablet 3  . nitroGLYCERIN  (NITROSTAT) 0.4 MG SL tablet Place 1 tablet (0.4 mg total) under the tongue every 5 (five) minutes as needed for chest pain. 25 tablet 3  . Omega-3 Fatty Acids (OMEGA-3 FISH OIL) 1200 MG CAPS 1,200 mg Two (2) times a day.    . pantoprazole (PROTONIX) 40 MG tablet TAKE 1 TABLET BY MOUTH  DAILY (Patient taking differently: 2 (two) times daily. ) 90 tablet 3  . PARoxetine (PAXIL) 20 MG tablet TAKE 1 TABLET BY MOUTH EVERY DAY IN THE MORNING 90 tablet 1  . valACYclovir (VALTREX) 1000 MG tablet TAKE 1 TABLET BY MOUTH 2  TIMES DAILY AS NEEDED FOR  VIRAL INFECTION. 180 tablet 0  . vitamin B-12 (CYANOCOBALAMIN) 500 MCG tablet Take 500 mcg by mouth daily.     No current facility-administered medications on file prior to visit.     Allergies  Allergen Reactions  . Codeine Nausea And Vomiting and Other (See Comments)    Severe stomach cramps  . Antihistamines, Diphenhydramine-Type Other (See Comments)    Causes hyperactivity  . Gabapentin     Urinary incontinence  . Statins Other (See Comments)    Joint pains  . Erythromycin Hives and Rash    Due to dental work about 50 years ago. Was used in packing and resulted in rash/hives inside and outside of mouth.    Past Medical History:  Diagnosis Date  . Allergy   . Anemia    childhood  . Anxiety   . Arterial stenosis (HCC)    mesenteric  . Arthritis   . Breast cancer ([place]) 12/05/10   R breast, inv mammary, in situ,ER/PR +,HER2 -  . Cancer ([place])   . Carcinoma of breast treated with adjuvant chemotherapy ([place])   . Coronary artery disease    stent 2007  . Depression   . Diabetes mellitus   . Difficulty sleeping   . Diverticulosis 12/10/03  . Elevated cholesterol   . Generalized weakness   . GERD (gastroesophageal reflux disease)   . Heart murmur    secondary to chem. 2013  . Hepatitis    as an infant  . HSV-1 (herpes simplex virus 1) infection   . Hyperlipidemia   . Hypertension   . Hypothyroidism   . Osteoarthritis   . Personal  history of chemotherapy   . Personal history of radiation therapy   . Pneumonia    2014 september  . 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 Spinal stenosis   . Thyroid disease     Past Surgical History:  Procedure Laterality Date  . ANTERIOR AND POSTERIOR REPAIR N/A 04/05/2016   Procedure: ANTERIOR (CYSTOCELE) AND POSTERIOR REPAIR (RECTOCELE);  Surgeon: [name], MD;  Location: [facility];  Service: Gynecology;  Laterality: N/A;  . BREAST BIOPSY    . BREAST LUMPECTOMY  1983   benign biopsy  . BREAST LUMPECTOMY Right 12/29/2010   snbx, ER/PR +, Her2 -, 0/1 node pos.  . CAROTID STENT  2007  . CARPAL TUNNEL RELEASE Bilateral 9/12,8,12  . CHOLECYSTECTOMY    . COLONOSCOPY N/A 02/09/2013   Procedure: COLONOSCOPY;  Surgeon: [name], MD;  Location: [facility];  Service: Endoscopy;  Laterality: N/A;  . COLONOSCOPY    . CORONARY ANGIOPLASTY    . DILATION AND CURETTAGE OF UTERUS    . ESOPHAGOGASTRODUODENOSCOPY N/A 02/09/2013   Procedure: ESOPHAGOGASTRODUODENOSCOPY (EGD);  Surgeon: [name], MD;  Location: [name];  Service: Endoscopy;  Laterality: N/A;  . FOOT SPURS    . HYSTEROSCOPY W/D&C N/A 09/11/2012   Procedure: DILATATION AND CURETTAGE ;  Surgeon: [name], MD;  Location: [facility];  Service: Gynecology;  Laterality: N/A;  . KNEE SURGERY  2007  . LAPAROSCOPIC ASSISTED VAGINAL HYSTERECTOMY N/A 04/05/2016   Procedure: LAPAROSCOPIC ASSISTED VAGINAL HYSTERECTOMY possible BSO;  Surgeon: [name], MD;  Location: [facility];  Service: Gynecology;  Laterality: N/A;  . LUMBAR LAMINECTOMY/DECOMPRESSION MICRODISCECTOMY N/A 10/20/2014   Procedure: MICRO LUMBER DECOMPRESSION L3-4 L4-5;  Surgeon: [name], MD;  Location: [facility];  Service: Orthopedics;  Laterality: N/A;  . LUMBAR LAMINECTOMY/DECOMPRESSION MICRODISCECTOMY Bilateral 10/13/2015   Procedure: MICRO LUMBAR DECOMPRESSION L5 - S1 AND REDO DECOMPRESSION L4 - L5 AND REMOVAL OF FACET CYST L4 - L5 2  LEVELS;  Surgeon: [name], MD;  Location: [facility];  Service: Orthopedics;  Laterality: Bilateral;  . PORT-A-CATH REMOVAL  04/17/2011   Procedure: REMOVAL PORT-A-CATH;  Surgeon: [name], MD;  Location: [facility];  Service: General;  Laterality: Left;  . PORTACATH PLACEMENT  02/07/2011   Procedure: INSERTION PORT-A-CATH;  Surgeon: [name], MD;  Location: [facility];  Service: General;  Laterality: N/A;  . UPPER GASTROINTESTINAL ENDOSCOPY      Family History  Problem Relation Age of Onset  . Throat cancer Father   . Alcoholism Father   . Heart attack Father   . Heart disease Mother   . Lymphoma Brother 11  . Cancer Son   . Heart attack Brother   . Diabetes Brother   . Hypertension Brother   . Colon cancer Neg Hx   . Stroke Neg Hx   . Esophageal cancer Neg Hx   . Rectal cancer Neg Hx   . Stomach cancer Neg Hx     Social History   Socioeconomic History  . Marital status: Married    Spouse name: Not on file  . Number of children: 2  . Years of education: Not on file  . Highest education level: Not on file  Occupational History  . Occupation: retired  Scientific laboratory technician  . Financial resource strain: Not on file  . Food insecurity:    Worry: Not on file    Inability: Not on file  . Transportation needs:    Medical: Not on file    Non-medical: Not on file  Tobacco Use  . Smoking status: Former Smoker    Packs/day: 2.00    Years: 40.00    Pack years: 80.00    Last attempt to quit: 12/19/1997    Years since quitting: 20.2  .  Smokeless tobacco: Never Used  Substance and Sexual Activity  . Alcohol use: Yes    Alcohol/week: 6.0 standard drinks    Types: 6 Glasses of wine per week    Comment: occasional  . Drug use: No  . Sexual activity: Not on file    Comment: menarch age 80, P46, HRT X 10 YRS, MENOPAUSE MID 40'S  Lifestyle  . Physical activity:    Days per week: Not on file    Minutes per session: Not on file  . Stress: Not on file    Relationships  . Social connections:    Talks on phone: Not on file    Gets together: Not on file    Attends religious service: Not on file    Active member of club or organization: Not on file    Attends meetings of clubs or organizations: Not on file    Relationship status: Not on file  . Intimate partner violence:    Fear of current or ex partner: Not on file    Emotionally abused: Not on file    Physically abused: Not on file    Forced sexual activity: Not on file  Other Topics Concern  . Not on file  Social History Narrative   [name] age 47- [place] age 89  [place]   The PMH, [place], Social History, Family History, Medications, and allergies have been reviewed in [facility], and have been updated if relevant.   Review of Systems  Constitutional: Negative.   Eyes: Negative.   Respiratory: Negative.   Cardiovascular: Negative.   Gastrointestinal: Negative.   Endocrine: Negative.   Genitourinary: Negative.   Musculoskeletal: Negative.   Allergic/Immunologic: Negative.   Neurological: Positive for headaches. Negative for dizziness, tremors, seizures, syncope, facial asymmetry, speech difficulty, weakness, light-headedness and numbness.  Hematological: Negative.   All other systems reviewed and are negative.      Objective:    BP (!) 150/84 (BP Location: Left Arm, Patient Position: Sitting, Cuff Size: Normal)   Pulse 91   Temp 97.8 F (36.6 C) (Oral)   Ht '5\' 3"'  (1.6 m)   Wt 150 lb 6.4 oz (68.2 kg)   SpO2 97%   BMI 26.64 kg/m    Physical Exam Vitals signs and nursing note reviewed.  Constitutional:      General: She is not in acute distress.    Appearance: Normal appearance.  HENT:     Head: Normocephalic.     Right Ear: External ear normal.     Left Ear: External ear normal.     Nose: Nose normal.     Mouth/Throat:     Mouth: Mucous membranes are moist.  Eyes:     Extraocular Movements: Extraocular movements intact.  Neck:     Musculoskeletal:  Normal range of motion.  Cardiovascular:     Rate and Rhythm: Normal rate and regular rhythm.     Pulses: Normal pulses.     Heart sounds: Normal heart sounds.  Pulmonary:     Effort: Pulmonary effort is normal.     Breath sounds: Normal breath sounds.  Musculoskeletal: Normal range of motion.     Right lower leg: No edema.     Left lower leg: No edema.  Skin:    General: Skin is warm and dry.  Neurological:     General: No focal deficit present.     Mental Status: She is alert and oriented to person, place, and time.     Cranial Nerves:  No cranial nerve deficit.     Motor: No weakness.     Gait: Gait normal.  Psychiatric:        Mood and Affect: Mood normal.        Behavior: Behavior normal.        Thought Content: Thought content normal.        Judgment: Judgment normal.           Assessment & Plan:   Type 2 diabetes mellitus with stage 3 chronic kidney disease, without long-term current use of insulin (HCC) - Plan: POCT HgB A1C, Comp Met (CMET)  Essential hypertension - Plan: Comp Met (CMET)  Type 2 diabetes mellitus with diabetic neuropathy, without long-term current use of insulin (HCC)  Stage 3 chronic kidney disease (HCC) No follow-ups on file.

## 2018-03-17 ENCOUNTER — Encounter: Payer: Self-pay | Admitting: Family Medicine

## 2018-03-17 ENCOUNTER — Ambulatory Visit (INDEPENDENT_AMBULATORY_CARE_PROVIDER_SITE_OTHER): Payer: Medicare Other | Admitting: Family Medicine

## 2018-03-17 VITALS — BP 150/84 | HR 91 | Temp 97.8°F | Ht 63.0 in | Wt 150.4 lb

## 2018-03-17 DIAGNOSIS — E1122 Type 2 diabetes mellitus with diabetic chronic kidney disease: Secondary | ICD-10-CM | POA: Diagnosis not present

## 2018-03-17 DIAGNOSIS — E1140 Type 2 diabetes mellitus with diabetic neuropathy, unspecified: Secondary | ICD-10-CM

## 2018-03-17 DIAGNOSIS — N183 Chronic kidney disease, stage 3 unspecified: Secondary | ICD-10-CM

## 2018-03-17 DIAGNOSIS — I10 Essential (primary) hypertension: Secondary | ICD-10-CM

## 2018-03-17 LAB — POCT GLYCOSYLATED HEMOGLOBIN (HGB A1C)
HbA1c POC (<> result, manual entry): 8.4 % (ref 4.0–5.6)
Hemoglobin A1C: 8.4 % — AB (ref 4.0–5.6)

## 2018-03-17 MED ORDER — LOSARTAN POTASSIUM 50 MG PO TABS: 50.0000 mg | ORAL_TABLET | Freq: Every day | ORAL | 3 refills | Status: DC

## 2018-03-17 MED ORDER — GLIPIZIDE ER 10 MG PO TB24: 10.0000 mg | ORAL_TABLET | Freq: Every day | ORAL | 3 refills | Status: DC

## 2018-03-17 NOTE — Assessment & Plan Note (Signed)
Deteriorated. She has had no low blood sugars with glipizide. Increase Glipizide to 10 mg XL daily.  Continue metformin and Januvia at current doses.

## 2018-03-17 NOTE — Patient Instructions (Addendum)
Great to see you. Start taking Glipizide XL 10 mg DAILY rather than alternating with the 5 mg dosage.  Start taking Losartan 50 mg daily.  Please come see me 2 weeks.

## 2018-03-17 NOTE — Assessment & Plan Note (Signed)
Deteriorated and she is symptomatic. Increase losartan to 50 mg daily. She will continue to check BP at home. Follow up in 2 weeks.

## 2018-03-17 NOTE — Assessment & Plan Note (Addendum)
Repeat kidney function in 2 weeks. Referred to renal in 12/2017 and was seen on 02/28/18. Advised to continue ARB and follow up in 6 months.

## 2018-03-28 NOTE — Progress Notes (Signed)
Subjective:   Patient ID: [name], female    DOB: 28-Jun-1940, 78 y.o.   MRN: [id number]  [name] is a pleasant 78 y.o. year old female who presents to clinic today with Follow-up (Patient is here today for a 2-week-F/U.  At 2.24.20 visit her Glipizide XL was increased to 32m 1qam and repeat kidney function in 2 weeks.  Losartan was also increased to 512m1qd. )  on 03/31/2018  HPI:  Here for follow up.  I last saw [name] on 03/17/18.  Note reviewed. She had called the office a few days prior asking if she could increase her dose of Metformin since her FSBS were high.  Instead, given her CKD III, I advised that she increase Glipizide but to do it slowly as it can cause hypoglycemia.  I advised to increase her Glipizide from 5 mg XL daily to alternating daily between 5 mg XL daily and 10 mg XL daily.  She tolerated this well without any episodes or symptoms of hypoglycemia. We therefore increased her glipizide to 10 mg XL daily.  Since then, fasting FSBS still running high- this morning it was 196.  Denies any episodes or symptoms of hypoglycemia.  Lab Results  Component Value Date   HGBA1C 8.4 (A) 03/17/2018   HGBA1C 8.4 03/17/2018   CKD III- I referred her to renal in 12/2017.  She was seen by Dr. UpHollie Salkn 02/28/18.  Note reviewed. She agreed that CKD is likely due to her DM and HTN and did not feel she needed a big renal work up. BP at that renal office visit on 02/28/18 was 140/90.  Advised to continue ARB and to follow up in 6 months.  HTN- since her BP was also increased at home, as high as 184/102 at home and she was having headaches with cozaar 25 mg daily, we also increased her cozaar to 50 mg daily on 03/17/18.  She has been tolerating this well.  HA have resolved.  Lab Results  Component Value Date   CREATININE 1.5 (A) 02/20/2018     Lab Results  Component Value Date   TSH 3.12 12/16/2017   Current Outpatient Medications on File Prior to Visit  Medication  Sig Dispense Refill  . ACCU-CHEK AVIVA PLUS test strip TEST ONCE A DAY AS DIRECTED 100 each PRN  . aspirin 81 MG tablet Take 1 tablet (81 mg total) by mouth daily. Resume 4 days post-op    . Cholecalciferol (VITAMIN D) 2000 units CAPS Take 1 capsule by mouth daily.    . DOCOSAHEXAENOIC ACID PO 1,200 mg Two (2) times a day.    . ferrous sulfate (FERROUSUL) 325 (65 FE) MG tablet Take 1 tablet (325 mg total) by mouth daily with breakfast. 30 tablet 3  . glipiZIDE (GLUCOTROL XL) 10 MG 24 hr tablet Take 1 tablet (10 mg total) by mouth daily with breakfast. 90 tablet 3  . JANUVIA 100 MG tablet TAKE 1 TABLET BY MOUTH  DAILY 90 tablet 1  . Lancet Device MISC 100 each by Does not apply route daily. UAD for daily glucose monitoring 100 each PRN  . Lancets Misc. (ACCU-CHEK MULTICLIX LANCET DEV) KIT Use as directed to check sugars    . levothyroxine (SYNTHROID, LEVOTHROID) 75 MCG tablet TAKE 1 TABLET BY MOUTH ONCE DAILY AT 6 AM 90 tablet 0  . losartan (COZAAR) 50 MG tablet Take 1 tablet (50 mg total) by mouth daily. 90 tablet 3  . Magnesium Cl-Calcium  Carbonate (SLOW MAGNESIUM/CALCIUM PO) Take 1 tablet by mouth daily.    . metFORMIN (GLUCOPHAGE) 500 MG tablet Take 1 tablet (500 mg total) by mouth daily with breakfast. 90 tablet 3  . nitroGLYCERIN (NITROSTAT) 0.4 MG SL tablet Place 1 tablet (0.4 mg total) under the tongue every 5 (five) minutes as needed for chest pain. 25 tablet 3  . Omega-3 Fatty Acids (OMEGA-3 FISH OIL) 1200 MG CAPS 1,200 mg Two (2) times a day.    . pantoprazole (PROTONIX) 40 MG tablet TAKE 1 TABLET BY MOUTH  DAILY (Patient taking differently: 2 (two) times daily. ) 90 tablet 3  . PARoxetine (PAXIL) 20 MG tablet TAKE 1 TABLET BY MOUTH EVERY DAY IN THE MORNING 90 tablet 1  . valACYclovir (VALTREX) 1000 MG tablet TAKE 1 TABLET BY MOUTH 2  TIMES DAILY AS NEEDED FOR  VIRAL INFECTION. 180 tablet 0  . vitamin B-12 (CYANOCOBALAMIN) 500 MCG tablet Take 500 mcg by mouth daily.    . colesevelam  (WELCHOL) 625 MG tablet Take 4 tablets (2,500 mg total) by mouth daily. (Patient not taking: Reported on 03/31/2018) 360 tablet 3   No current facility-administered medications on file prior to visit.     Allergies  Allergen Reactions  . Codeine Nausea And Vomiting and Other (See Comments)    Severe stomach cramps  . Antihistamines, Diphenhydramine-Type Other (See Comments)    Causes hyperactivity  . Gabapentin     Urinary incontinence  . Statins Other (See Comments)    Joint pains  . Erythromycin Hives and Rash    Due to dental work about 50 years ago. Was used in packing and resulted in rash/hives inside and outside of mouth.    Past Medical History:  Diagnosis Date  . Allergy   . Anemia    childhood  . Anxiety   . Arterial stenosis (HCC)    mesenteric  . Arthritis   . Breast cancer ([place]) 12/05/10   R breast, inv mammary, in situ,ER/PR +,HER2 -  . Cancer ([place])   . Carcinoma of breast treated with adjuvant chemotherapy ([place])   . Coronary artery disease    stent 2007  . Depression   . Diabetes mellitus   . Difficulty sleeping   . Diverticulosis 12/10/03  . Elevated cholesterol   . Generalized weakness   . GERD (gastroesophageal reflux disease)   . Heart murmur    secondary to chem. 2013  . Hepatitis    as an infant  . HSV-1 (herpes simplex virus 1) infection   . Hyperlipidemia   . Hypertension   . Hypothyroidism   . Osteoarthritis   . Personal history of chemotherapy   . Personal history of radiation therapy   . Pneumonia    2014 september  . 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 Spinal stenosis   . Thyroid disease     Past Surgical History:  Procedure Laterality Date  . ANTERIOR AND POSTERIOR REPAIR N/A 04/05/2016   Procedure: ANTERIOR (CYSTOCELE) AND POSTERIOR REPAIR (RECTOCELE);  Surgeon: [name], MD;  Location: [facility];  Service: Gynecology;  Laterality: N/A;  . BREAST BIOPSY    . BREAST LUMPECTOMY  1983   benign biopsy  .  BREAST LUMPECTOMY Right 12/29/2010   snbx, ER/PR +, Her2 -, 0/1 node pos.  . CAROTID STENT  2007  . CARPAL TUNNEL RELEASE Bilateral 9/12,8,12  . CHOLECYSTECTOMY    . COLONOSCOPY N/A 02/09/2013   Procedure: COLONOSCOPY;  Surgeon: Sydell Axon  Andris Baumann, MD;  Location: [name];  Service: Endoscopy;  Laterality: N/A;  . COLONOSCOPY    . CORONARY ANGIOPLASTY    . DILATION AND CURETTAGE OF UTERUS    . ESOPHAGOGASTRODUODENOSCOPY N/A 02/09/2013   Procedure: ESOPHAGOGASTRODUODENOSCOPY (EGD);  Surgeon: [name], MD;  Location: [name];  Service: Endoscopy;  Laterality: N/A;  . FOOT SPURS    . HYSTEROSCOPY W/D&C N/A 09/11/2012   Procedure: DILATATION AND CURETTAGE ;  Surgeon: [name], MD;  Location: [facility];  Service: Gynecology;  Laterality: N/A;  . KNEE SURGERY  2007  . LAPAROSCOPIC ASSISTED VAGINAL HYSTERECTOMY N/A 04/05/2016   Procedure: LAPAROSCOPIC ASSISTED VAGINAL HYSTERECTOMY possible BSO;  Surgeon: [name], MD;  Location: [facility];  Service: Gynecology;  Laterality: N/A;  . LUMBAR LAMINECTOMY/DECOMPRESSION MICRODISCECTOMY N/A 10/20/2014   Procedure: MICRO LUMBER DECOMPRESSION L3-4 L4-5;  Surgeon: [name], MD;  Location: [facility];  Service: Orthopedics;  Laterality: N/A;  . LUMBAR LAMINECTOMY/DECOMPRESSION MICRODISCECTOMY Bilateral 10/13/2015   Procedure: MICRO LUMBAR DECOMPRESSION L5 - S1 AND REDO DECOMPRESSION L4 - L5 AND REMOVAL OF FACET CYST L4 - L5 2 LEVELS;  Surgeon: [name], MD;  Location: [facility];  Service: Orthopedics;  Laterality: Bilateral;  . PORT-A-CATH REMOVAL  04/17/2011   Procedure: REMOVAL PORT-A-CATH;  Surgeon: [name], MD;  Location: [facility];  Service: General;  Laterality: Left;  . PORTACATH PLACEMENT  02/07/2011   Procedure: INSERTION PORT-A-CATH;  Surgeon: [name], MD;  Location: [facility];  Service: General;  Laterality: N/A;  . UPPER GASTROINTESTINAL ENDOSCOPY      Family History  Problem Relation Age of Onset    . Throat cancer Father   . Alcoholism Father   . Heart attack Father   . Heart disease Mother   . Lymphoma Brother 62  . Cancer Son   . Heart attack Brother   . Diabetes Brother   . Hypertension Brother   . Colon cancer Neg Hx   . Stroke Neg Hx   . Esophageal cancer Neg Hx   . Rectal cancer Neg Hx   . Stomach cancer Neg Hx     Social History   Socioeconomic History  . Marital status: Married    Spouse name: Not on file  . Number of children: 2  . Years of education: Not on file  . Highest education level: Not on file  Occupational History  . Occupation: retired  Scientific laboratory technician  . Financial resource strain: Not on file  . Food insecurity:    Worry: Not on file    Inability: Not on file  . Transportation needs:    Medical: Not on file    Non-medical: Not on file  Tobacco Use  . Smoking status: Former Smoker    Packs/day: 2.00    Years: 40.00    Pack years: 80.00    Last attempt to quit: 12/19/1997    Years since quitting: 20.2  . Smokeless tobacco: Never Used  Substance and Sexual Activity  . Alcohol use: Yes    Alcohol/week: 6.0 standard drinks    Types: 6 Glasses of wine per week    Comment: occasional  . Drug use: No  . Sexual activity: Not on file    Comment: menarch age 41, P44, HRT X 10 YRS, MENOPAUSE MID 40'S  Lifestyle  . Physical activity:    Days per week: Not on file    Minutes per session: Not on file  . Stress: Not on file  Relationships  .  Social connections:    Talks on phone: Not on file    Gets together: Not on file    Attends religious service: Not on file    Active member of club or organization: Not on file    Attends meetings of clubs or organizations: Not on file    Relationship status: Not on file  . Intimate partner violence:    Fear of current or ex partner: Not on file    Emotionally abused: Not on file    Physically abused: Not on file    Forced sexual activity: Not on file  Other Topics Concern  . Not on file  Social  History Narrative   [name] age 59- [place] age 79  [place]   The PMH, [place], Social History, Family History, Medications, and allergies have been reviewed in [facility], and have been updated if relevant.  Review of Systems  Constitutional: Negative.   HENT: Negative.   Eyes: Negative.   Respiratory: Negative.   Cardiovascular: Negative.   Gastrointestinal: Negative.   Endocrine: Negative.   Genitourinary: Negative.   Musculoskeletal: Negative.   Allergic/Immunologic: Negative.   Neurological: Negative.   Hematological: Negative.   Psychiatric/Behavioral: Negative.   All other systems reviewed and are negative.      Objective:    BP 130/72   Pulse 86   Temp 97.6 F (36.4 C) (Oral)   Wt 150 lb 9.6 oz (68.3 kg)   SpO2 96%   BMI 26.68 kg/m    Physical Exam Vitals signs and nursing note reviewed.  Constitutional:      General: She is not in acute distress.    Appearance: Normal appearance. She is not ill-appearing.  HENT:     Head: Normocephalic and atraumatic.     Nose: Nose normal.     Mouth/Throat:     Mouth: Mucous membranes are moist.  Eyes:     Extraocular Movements: Extraocular movements intact.  Neck:     Musculoskeletal: Normal range of motion.  Cardiovascular:     Rate and Rhythm: Normal rate.     Pulses: Normal pulses.  Pulmonary:     Effort: Pulmonary effort is normal.  Musculoskeletal: Normal range of motion.     Right lower leg: No edema.     Left lower leg: No edema.  Skin:    General: Skin is warm and dry.  Neurological:     General: No focal deficit present.     Mental Status: She is alert and oriented to person, place, and time.  Psychiatric:        Mood and Affect: Mood normal.        Behavior: Behavior normal.        Thought Content: Thought content normal.        Judgment: Judgment normal.           Assessment & Plan:   Type 2 diabetes, uncontrolled, with renal manifestation (HCC) - Plan: Comp Met (CMET)  Chronic  kidney disease (CKD), stage III (moderate) (HCC) - Plan: Comp Met (CMET)  Essential hypertension No follow-ups on file.

## 2018-03-31 ENCOUNTER — Encounter: Payer: Self-pay | Admitting: Family Medicine

## 2018-03-31 ENCOUNTER — Ambulatory Visit (INDEPENDENT_AMBULATORY_CARE_PROVIDER_SITE_OTHER): Payer: Medicare Other | Admitting: Family Medicine

## 2018-03-31 VITALS — BP 130/72 | HR 86 | Temp 97.6°F | Wt 150.6 lb

## 2018-03-31 DIAGNOSIS — N183 Chronic kidney disease, stage 3 unspecified: Secondary | ICD-10-CM

## 2018-03-31 DIAGNOSIS — E1129 Type 2 diabetes mellitus with other diabetic kidney complication: Secondary | ICD-10-CM

## 2018-03-31 DIAGNOSIS — IMO0002 Reserved for concepts with insufficient information to code with codable children: Secondary | ICD-10-CM

## 2018-03-31 DIAGNOSIS — I10 Essential (primary) hypertension: Secondary | ICD-10-CM | POA: Diagnosis not present

## 2018-03-31 DIAGNOSIS — E1165 Type 2 diabetes mellitus with hyperglycemia: Secondary | ICD-10-CM

## 2018-03-31 LAB — COMPREHENSIVE METABOLIC PANEL
ALT: 24 U/L (ref 0–35)
AST: 26 U/L (ref 0–37)
Albumin: 4 g/dL (ref 3.5–5.2)
Alkaline Phosphatase: 75 U/L (ref 39–117)
BUN: 30 mg/dL — ABNORMAL HIGH (ref 6–23)
CHLORIDE: 100 meq/L (ref 96–112)
CO2: 25 mEq/L (ref 19–32)
Calcium: 9.3 mg/dL (ref 8.4–10.5)
Creatinine, Ser: 1.41 mg/dL — ABNORMAL HIGH (ref 0.40–1.20)
GFR: 36.32 mL/min — AB (ref >60.00)
Glucose, Bld: 251 mg/dL — ABNORMAL HIGH (ref 70–99)
POTASSIUM: 4.5 meq/L (ref 3.5–5.1)
Sodium: 135 mEq/L (ref 135–145)
Total Bilirubin: 0.4 mg/dL (ref 0.2–1.2)
Total Protein: 7 g/dL (ref 6.0–8.3)

## 2018-03-31 NOTE — Assessment & Plan Note (Signed)
Per pt, fasting FSBS still running high. Will check CMET today.  If renal function, stable, consider adjusting metformin. The patient indicates understanding of these issues and agrees with the plan.

## 2018-03-31 NOTE — Patient Instructions (Signed)
Great to see you. I will call you with your lab results from today and you can view them online.   

## 2018-03-31 NOTE — Assessment & Plan Note (Signed)
Well controlled on higher dose of cozaar.  Continue current dose.  No changes made today.

## 2018-04-02 ENCOUNTER — Telehealth: Payer: Self-pay

## 2018-04-02 DIAGNOSIS — E1165 Type 2 diabetes mellitus with hyperglycemia: Principal | ICD-10-CM

## 2018-04-02 DIAGNOSIS — E1129 Type 2 diabetes mellitus with other diabetic kidney complication: Secondary | ICD-10-CM

## 2018-04-02 DIAGNOSIS — IMO0002 Reserved for concepts with insufficient information to code with codable children: Secondary | ICD-10-CM

## 2018-04-02 NOTE — Telephone Encounter (Signed)
Lab was ordered. Nothing further is needed   Lucille Passy, MD  Marin Roberts, RMA        Yes please. Thank you.   Previous Messages        Result Notes for Comp Met (CMET)   Notes recorded by Marin Roberts, RMA on 04/01/2018 at 2:49 PM EDT TA- I spoke with pt and she understood, in regards to labs when she returns to have drawn was it just a cmet you wanted? ------  Notes recorded by Lucille Passy, MD on 04/01/2018 at 10:30 AM EDT Sorry for the confusion. I was saying I wanted to give the new dose of glucotrol (10 mg) a couple of more weeks to work. If FSBS remains high, we can add second dose of metformin back. ------  Notes recorded by Shawnie Pons, LPN on 08/13/7735 at 50:51 AM EDT Pt verbalize understand of test result.   Pt agree to dosage change, please advise. ------  Notes recorded by Lucille Passy, MD on 04/01/2018 at 10:01 AM EDT Please call pt- kidney function is about the same. I'd like to give her diabetes medication dose increase just a couple more weeks before we make a change. Would she be okay with that? Of course, she can contact me if her blood sugars continue to go up or if she has concerns about waiting.

## 2018-04-03 ENCOUNTER — Ambulatory Visit: Payer: Medicare Other | Admitting: Family Medicine

## 2018-04-07 ENCOUNTER — Encounter: Payer: Self-pay | Admitting: Family Medicine

## 2018-04-07 ENCOUNTER — Other Ambulatory Visit: Payer: Self-pay

## 2018-04-07 ENCOUNTER — Ambulatory Visit (INDEPENDENT_AMBULATORY_CARE_PROVIDER_SITE_OTHER): Payer: Medicare Other | Admitting: Family Medicine

## 2018-04-07 ENCOUNTER — Telehealth: Payer: Self-pay | Admitting: Family Medicine

## 2018-04-07 ENCOUNTER — Ambulatory Visit (INDEPENDENT_AMBULATORY_CARE_PROVIDER_SITE_OTHER): Payer: Medicare Other

## 2018-04-07 VITALS — BP 136/84 | HR 70 | Temp 97.7°F | Ht 63.0 in | Wt 150.8 lb

## 2018-04-07 DIAGNOSIS — R05 Cough: Secondary | ICD-10-CM

## 2018-04-07 DIAGNOSIS — R059 Cough, unspecified: Secondary | ICD-10-CM | POA: Insufficient documentation

## 2018-04-07 LAB — BASIC METABOLIC PANEL
BUN: 24 mg/dL — ABNORMAL HIGH (ref 6–23)
CO2: 25 meq/L (ref 19–32)
Calcium: 9.3 mg/dL (ref 8.4–10.5)
Chloride: 95 mEq/L — ABNORMAL LOW (ref 96–112)
Creatinine, Ser: 1.44 mg/dL — ABNORMAL HIGH (ref 0.40–1.20)
GFR: 35.45 mL/min — ABNORMAL LOW (ref >60.00)
Glucose, Bld: 466 mg/dL — ABNORMAL HIGH (ref 70–99)
Potassium: 4.8 mEq/L (ref 3.5–5.1)
Sodium: 131 mEq/L — ABNORMAL LOW (ref 135–145)

## 2018-04-07 LAB — BRAIN NATRIURETIC PEPTIDE: Pro B Natriuretic peptide (BNP): 82 pg/mL (ref 0.0–100.0)

## 2018-04-07 LAB — CBC WITH DIFFERENTIAL/PLATELET
Basophils Absolute: 0.1 10*3/uL (ref 0.0–0.1)
Basophils Relative: 0.6 % (ref 0.0–3.0)
Eosinophils Absolute: 0.6 10*3/uL (ref 0.0–0.7)
Eosinophils Relative: 4.8 % (ref 0.0–5.0)
HCT: 39.7 % (ref 36.0–46.0)
HEMOGLOBIN: 13.2 g/dL (ref 12.0–15.0)
Lymphocytes Relative: 18.9 % (ref 12.0–46.0)
Lymphs Abs: 2.3 10*3/uL (ref 0.7–4.0)
MCHC: 33.2 g/dL (ref 30.0–36.0)
MCV: 86.7 fl (ref 78.0–100.0)
Monocytes Absolute: 1.1 10*3/uL — ABNORMAL HIGH (ref 0.1–1.0)
Monocytes Relative: 8.9 % (ref 3.0–12.0)
Neutro Abs: 8.1 10*3/uL — ABNORMAL HIGH (ref 1.4–7.7)
Neutrophils Relative %: 66.8 % (ref 43.0–77.0)
Platelets: 275 10*3/uL (ref 150.0–400.0)
RBC: 4.57 Mil/uL (ref 3.87–5.11)
RDW: 14.4 % (ref 11.5–15.5)
WBC: 12.1 10*3/uL — AB (ref 4.0–10.5)

## 2018-04-07 MED ORDER — AMOXICILLIN-POT CLAVULANATE 875-125 MG PO TABS: 1.0000 | ORAL_TABLET | Freq: Two times a day (BID) | ORAL | 0 refills | Status: AC

## 2018-04-07 MED ORDER — LEVOTHYROXINE SODIUM 75 MCG PO TABS: ORAL_TABLET | ORAL | 2 refills | Status: DC

## 2018-04-07 MED ORDER — DOXYCYCLINE HYCLATE 100 MG PO TABS: 100.0000 mg | ORAL_TABLET | Freq: Two times a day (BID) | ORAL | 0 refills | Status: AC

## 2018-04-07 MED ORDER — PREDNISONE 20 MG PO TABS: 40.0000 mg | ORAL_TABLET | Freq: Every day | ORAL | 0 refills | Status: DC

## 2018-04-07 NOTE — Progress Notes (Signed)
[name] - 78 y.o. female MRN [id number]  Date of birth: 29-Jan-1940  SUBJECTIVE:  Including CC & ROS.  Chief Complaint  Patient presents with  . Cough    cough-- mucous green/ started out with throat burning and lost voice/ chest tightness/ OTC advil allergy,musinex    [name] is a 78 y.o. female that is  Presenting with cough. Symptoms have been present for a few days. Denies fever. Having some green sputum production with cough. Seems to be worse at night. No history of HF. No significant leg swelling. Husband has had similar symptoms. No recent travel. No new medications. .  Review of the echo from 12/11/2017 shows an ejection fraction of 55 to 60% with grade 1 diastolic dysfunction with aortic valve with mild to moderate stenosis.  Review of Systems  Constitutional: Negative for fever.  HENT: Positive for sore throat. Negative for congestion.   Respiratory: Positive for cough.   Cardiovascular: Negative for chest pain.  Gastrointestinal: Negative for abdominal distention.  Musculoskeletal: Negative for gait problem.  Skin: Negative for color change.    HISTORY: Past Medical, Surgical, Social, and Family History Reviewed & Updated per EMR.   Pertinent Historical Findings include:  Past Medical History:  Diagnosis Date  . Allergy   . Anemia    childhood  . Anxiety   . Arterial stenosis (HCC)    mesenteric  . Arthritis   . 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 Cancer ([place])   . Carcinoma of breast treated with adjuvant chemotherapy ([place])   . Coronary artery disease    stent 2007  . Depression   . Diabetes mellitus   . Difficulty sleeping   . Diverticulosis 12/10/03  . Elevated cholesterol   . Generalized weakness   . GERD (gastroesophageal reflux disease)   . Heart murmur    secondary to chem. 2013  . Hepatitis    as an infant  . HSV-1 (herpes simplex virus 1) infection   . Hyperlipidemia   . Hypertension    . Hypothyroidism   . Osteoarthritis   . Personal history of chemotherapy   . Personal history of radiation therapy   . Pneumonia    2014 september  . 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 Spinal stenosis   . Thyroid disease     Past Surgical History:  Procedure Laterality Date  . ANTERIOR AND POSTERIOR REPAIR N/A 04/05/2016   Procedure: ANTERIOR (CYSTOCELE) AND POSTERIOR REPAIR (RECTOCELE);  Surgeon: [name], MD;  Location: [facility];  Service: Gynecology;  Laterality: N/A;  . BREAST BIOPSY    . BREAST LUMPECTOMY  1983   benign biopsy  . BREAST LUMPECTOMY Right 12/29/2010   snbx, ER/PR +, Her2 -, 0/1 node pos.  . CAROTID STENT  2007  . CARPAL TUNNEL RELEASE Bilateral 9/12,8,12  . CHOLECYSTECTOMY    . COLONOSCOPY N/A 02/09/2013   Procedure: COLONOSCOPY;  Surgeon: [name], MD;  Location: [facility];  Service: Endoscopy;  Laterality: N/A;  . COLONOSCOPY    . CORONARY ANGIOPLASTY    . DILATION AND CURETTAGE OF UTERUS    . ESOPHAGOGASTRODUODENOSCOPY N/A 02/09/2013   Procedure: ESOPHAGOGASTRODUODENOSCOPY (EGD);  Surgeon: [name], MD;  Location: [name];  Service: Endoscopy;  Laterality: N/A;  . FOOT SPURS    . HYSTEROSCOPY W/D&C N/A 09/11/2012   Procedure: DILATATION AND CURETTAGE ;  Surgeon: [name], MD;  Location: [facility];  Service: Gynecology;  Laterality: N/A;  . KNEE SURGERY  2007  . LAPAROSCOPIC ASSISTED VAGINAL HYSTERECTOMY N/A 04/05/2016   Procedure: LAPAROSCOPIC ASSISTED VAGINAL HYSTERECTOMY possible BSO;  Surgeon: [name], MD;  Location: [facility];  Service: Gynecology;  Laterality: N/A;  . LUMBAR LAMINECTOMY/DECOMPRESSION MICRODISCECTOMY N/A 10/20/2014   Procedure: MICRO LUMBER DECOMPRESSION L3-4 L4-5;  Surgeon: [name], MD;  Location: [facility];  Service: Orthopedics;  Laterality: N/A;  . LUMBAR LAMINECTOMY/DECOMPRESSION MICRODISCECTOMY Bilateral 10/13/2015   Procedure: MICRO LUMBAR DECOMPRESSION L5 - S1 AND REDO  DECOMPRESSION L4 - L5 AND REMOVAL OF FACET CYST L4 - L5 2 LEVELS;  Surgeon: [name], MD;  Location: [facility];  Service: Orthopedics;  Laterality: Bilateral;  . PORT-A-CATH REMOVAL  04/17/2011   Procedure: REMOVAL PORT-A-CATH;  Surgeon: [name], MD;  Location: [facility];  Service: General;  Laterality: Left;  . PORTACATH PLACEMENT  02/07/2011   Procedure: INSERTION PORT-A-CATH;  Surgeon: [name], MD;  Location: [facility];  Service: General;  Laterality: N/A;  . UPPER GASTROINTESTINAL ENDOSCOPY      Allergies  Allergen Reactions  . Codeine Nausea And Vomiting and Other (See Comments)    Severe stomach cramps  . Antihistamines, Diphenhydramine-Type Other (See Comments)    Causes hyperactivity  . Gabapentin     Urinary incontinence  . Statins Other (See Comments)    Joint pains  . Erythromycin Hives and Rash    Due to dental work about 50 years ago. Was used in packing and resulted in rash/hives inside and outside of mouth.    Family History  Problem Relation Age of Onset  . Throat cancer Father   . Alcoholism Father   . Heart attack Father   . Heart disease Mother   . Lymphoma Brother 65  . Cancer Son   . Heart attack Brother   . Diabetes Brother   . Hypertension Brother   . Colon cancer Neg Hx   . Stroke Neg Hx   . Esophageal cancer Neg Hx   . Rectal cancer Neg Hx   . Stomach cancer Neg Hx      Social History   Socioeconomic History  . Marital status: Married    Spouse name: Not on file  . Number of children: 2  . Years of education: Not on file  . Highest education level: Not on file  Occupational History  . Occupation: retired  Scientific laboratory technician  . Financial resource strain: Not on file  . Food insecurity:    Worry: Not on file    Inability: Not on file  . Transportation needs:    Medical: Not on file    Non-medical: Not on file  Tobacco Use  . Smoking status: Former Smoker    Packs/day: 2.00    Years: 40.00    Pack years:  80.00    Last attempt to quit: 12/19/1997    Years since quitting: 20.3  . Smokeless tobacco: Never Used  Substance and Sexual Activity  . Alcohol use: Yes    Alcohol/week: 6.0 standard drinks    Types: 6 Glasses of wine per week    Comment: occasional  . Drug use: No  . Sexual activity: Not on file    Comment: menarch age 41, P72, HRT X 10 YRS, MENOPAUSE MID 40'S  Lifestyle  . Physical activity:    Days per week: Not on file    Minutes per session: Not on file  . Stress: Not on file  Relationships  . Social connections:    Talks on phone: Not on file    Gets together: Not on file    Attends religious service: Not on file    Active member of club or organization: Not on file    Attends meetings of clubs or organizations: Not on file    Relationship status: Not on file  . Intimate partner violence:    Fear of current or ex partner: Not on file    Emotionally abused: Not on file    Physically abused: Not on file    Forced sexual activity: Not on file  Other Topics Concern  . Not on file  Social History Narrative   [name] age 34- [name] age 59  [place]     PHYSICAL EXAM:  VS: BP 136/84   Pulse 70   Temp 97.7 F (36.5 C) (Oral)   Ht '5\' 3"'  (1.6 m)   Wt 150 lb 12.8 oz (68.4 kg)   SpO2 96%   BMI 26.71 kg/m  Physical Exam Gen: NAD, alert, cooperative with exam,  ENT: normal lips, normal nasal mucosa,  Eye: normal EOM, normal conjunctiva and lids CV:  no edema, +2 pedal pulses, regular rate and rhythm, S1-S2   Resp: no accessory muscle use, non-labored, Wheezing appreciated in left lower lobe. Clear upper lung fields  Skin: no rashes, no areas of induration  Neuro: normal tone, normal sensation to touch Psych:  normal insight, alert and oriented MSK: Normal gait, normal strength       ASSESSMENT & PLAN:   Cough May have reactive airway. Less likely for pneumonia. Possible for fluid with worsening while lying flay but no history of HF.   - chest  xray  - BNP, CBC, and BMP  - prednisone  - given indications to follow up and seek immediate care

## 2018-04-07 NOTE — Telephone Encounter (Signed)
Spoke with patient about results. Chest xray doesn't show a specific infiltrate but patient looked ill this morning. Has a white count. Will treat with augmentin and doxycycline.   Rosemarie Ax, MD [facility] 04/07/2018, 4:59 PM

## 2018-04-07 NOTE — Assessment & Plan Note (Signed)
May have reactive airway. Less likely for pneumonia. Possible for fluid with worsening while lying flay but no history of HF.   - chest xray  - BNP, CBC, and BMP  - prednisone  - given indications to follow up and seek immediate care

## 2018-04-07 NOTE — Patient Instructions (Signed)
Nice to meet you  Please try the steroids  I will call you with the results from today  Please follow up if your symptoms get worse or seek immediate care.

## 2018-04-08 ENCOUNTER — Telehealth: Payer: Self-pay

## 2018-04-08 NOTE — Telephone Encounter (Signed)
Copied from [place] [id number]. Topic: General - Inquiry >> Apr 08, 2018  2:07 PM Sheran Luz wrote: Reason for CRM: Patient requesting a call back from Dr. Raeford Razor or [place], as she does not understand why she was not tested for coronavirus. Please advise?

## 2018-04-08 NOTE — Telephone Encounter (Signed)
Spoke with patient.   Rosemarie Ax, MD [facility] 04/08/2018, 3:52 PM

## 2018-04-08 NOTE — Telephone Encounter (Signed)
Dr. Raeford Razor please advise?

## 2018-04-14 ENCOUNTER — Telehealth: Payer: Self-pay

## 2018-04-14 NOTE — Telephone Encounter (Signed)
Copied from [place] [id number]. Topic: General - Other >> Apr 14, 2018  1:56 PM Bea Graff, NT wrote: Reason for CRM: Pt requesting a call from [place].

## 2018-04-15 ENCOUNTER — Other Ambulatory Visit: Payer: Medicare Other

## 2018-04-15 NOTE — Telephone Encounter (Signed)
Yes of course. Thank you!

## 2018-04-15 NOTE — Telephone Encounter (Signed)
TA-I spoke with pt/she is having Sx of yeast inf from recent abx use/may I send in Diflucan 150mg  #1? Plz advise/thx dmf

## 2018-04-18 ENCOUNTER — Other Ambulatory Visit: Payer: Self-pay | Admitting: Family Medicine

## 2018-04-18 MED ORDER — FLUCONAZOLE 150 MG PO TABS: 150.0000 mg | ORAL_TABLET | Freq: Once | ORAL | 0 refills | Status: DC

## 2018-04-21 ENCOUNTER — Telehealth: Payer: Self-pay | Admitting: Family Medicine

## 2018-04-21 NOTE — Telephone Encounter (Signed)
Copied from [place] [id number]. Topic: Quick Communication - See Telephone Encounter >> Apr 21, 2018  3:32 PM Rayann Heman wrote: CRM for notification. See Telephone encounter for: 04/21/18. Pt called and stated that she is taking an antibiotic and now has a yeast infection and would like to know if something could be called in. Please advise

## 2018-04-23 ENCOUNTER — Other Ambulatory Visit: Payer: Self-pay

## 2018-04-23 MED ORDER — FLUCONAZOLE 150 MG PO TABS: 150.0000 mg | ORAL_TABLET | Freq: Once | ORAL | 0 refills | Status: DC

## 2018-04-23 NOTE — Telephone Encounter (Signed)
Rx was sent in on 3.27.20 for Diflucan/thx dmf

## 2018-05-06 ENCOUNTER — Telehealth: Payer: Self-pay

## 2018-05-06 NOTE — Telephone Encounter (Signed)
Recall entered in epic for 8 week OV.

## 2018-05-06 NOTE — Telephone Encounter (Signed)
-----   Message from Yetta Flock, MD sent at 05/05/2018  5:24 PM EDT ----- Regarding: RE: repeat lab work [employer], yes can hold off on repeat CBC for now. I would like to see her in clinic in a few months for reassessment however. Thanks ----- Message ----- From: Algernon Huxley, RN Sent: 05/05/2018  12:43 PM EDT To: Yetta Flock, MD Subject: FW: repeat lab work                            Dr. Havery Moros,  You had wanted this pt to have a repeat CBC done in April. Appears she had one drawn in May at another office. Do you want her to repeat the CBC?Marland Kitchen  Thanks, Vaughan Basta  ----- Message ----- From: Mohammed Kindle, RN Sent: 05/02/2018 To: Mohammed Kindle, RN Subject: repeat lab work                                Armbruster, Carlota Raspberry, MD   Lesly Rubenstein can you please let this patient know her CBC has normalized. She should continue iron supplementation and recommend we repeat a CBC in 3 months. Thanks   Enter lab orders in [place];

## 2018-05-16 ENCOUNTER — Other Ambulatory Visit: Payer: Self-pay

## 2018-05-16 ENCOUNTER — Other Ambulatory Visit (INDEPENDENT_AMBULATORY_CARE_PROVIDER_SITE_OTHER): Payer: Medicare Other

## 2018-05-16 ENCOUNTER — Other Ambulatory Visit: Payer: Self-pay | Admitting: Family Medicine

## 2018-05-16 DIAGNOSIS — E1129 Type 2 diabetes mellitus with other diabetic kidney complication: Secondary | ICD-10-CM

## 2018-05-16 DIAGNOSIS — E1165 Type 2 diabetes mellitus with hyperglycemia: Secondary | ICD-10-CM | POA: Diagnosis not present

## 2018-05-16 DIAGNOSIS — IMO0002 Reserved for concepts with insufficient information to code with codable children: Secondary | ICD-10-CM

## 2018-05-16 NOTE — Progress Notes (Signed)
Patient and lab staff wore face masks during visit per COVID-19 protocol.

## 2018-05-17 LAB — COMPREHENSIVE METABOLIC PANEL
ALT: 22 IU/L (ref 0–32)
AST: 26 IU/L (ref 0–40)
Albumin/Globulin Ratio: 1.7 (ref 1.2–2.2)
Albumin: 4.3 g/dL (ref 3.7–4.7)
Alkaline Phosphatase: 92 IU/L (ref 39–117)
BUN/Creatinine Ratio: 17 (ref 12–28)
BUN: 27 mg/dL (ref 8–27)
Bilirubin Total: 0.4 mg/dL (ref 0.0–1.2)
CO2: 22 mmol/L (ref 20–29)
Calcium: 9.6 mg/dL (ref 8.7–10.3)
Chloride: 101 mmol/L (ref 96–106)
Creatinine, Ser: 1.61 mg/dL — ABNORMAL HIGH (ref 0.57–1.00)
GFR calc Af Amer: 36 mL/min/{1.73_m2} — ABNORMAL LOW (ref >59)
GFR calc non Af Amer: 31 mL/min/{1.73_m2} — ABNORMAL LOW (ref >59)
Globulin, Total: 2.5 g/dL (ref 1.5–4.5)
Glucose: 209 mg/dL — ABNORMAL HIGH (ref 65–99)
Potassium: 5 mmol/L (ref 3.5–5.2)
Sodium: 141 mmol/L (ref 134–144)
Total Protein: 6.8 g/dL (ref 6.0–8.5)

## 2018-05-20 ENCOUNTER — Other Ambulatory Visit: Payer: Self-pay | Admitting: Family Medicine

## 2018-05-20 ENCOUNTER — Telehealth: Payer: Self-pay | Admitting: Family Medicine

## 2018-05-20 NOTE — Telephone Encounter (Signed)
Copied from [place] [id number]. Topic: General - Other >> May 20, 2018 12:16 PM Pauline Good wrote: Reason for CRM: pt want lab results

## 2018-05-20 NOTE — Telephone Encounter (Signed)
Dr. Deborra Medina please advise, lab work done on 05/16/2018--pt is concern if changes need to be made on her meds from this lab result.

## 2018-05-22 ENCOUNTER — Other Ambulatory Visit (INDEPENDENT_AMBULATORY_CARE_PROVIDER_SITE_OTHER): Payer: Medicare Other

## 2018-05-22 ENCOUNTER — Other Ambulatory Visit: Payer: Self-pay

## 2018-05-22 DIAGNOSIS — IMO0002 Reserved for concepts with insufficient information to code with codable children: Secondary | ICD-10-CM

## 2018-05-22 DIAGNOSIS — E1129 Type 2 diabetes mellitus with other diabetic kidney complication: Secondary | ICD-10-CM

## 2018-05-22 DIAGNOSIS — R300 Dysuria: Secondary | ICD-10-CM

## 2018-05-22 DIAGNOSIS — E1165 Type 2 diabetes mellitus with hyperglycemia: Secondary | ICD-10-CM | POA: Diagnosis not present

## 2018-05-22 LAB — POCT URINALYSIS DIPSTICK
Glucose, UA: NEGATIVE
Nitrite, UA: NEGATIVE
Protein, UA: POSITIVE — AB
Spec Grav, UA: >=1.03 — AB (ref 1.010–1.025)
Urobilinogen, UA: 0.2 E.U./dL
pH, UA: 5.5 (ref 5.0–8.0)

## 2018-05-22 LAB — POCT GLYCOSYLATED HEMOGLOBIN (HGB A1C)
HbA1c POC (<> result, manual entry): 10 % (ref 4.0–5.6)
Hemoglobin A1C: 10 % — AB (ref 4.0–5.6)

## 2018-05-22 NOTE — Telephone Encounter (Signed)
That's perfect - thanks!

## 2018-05-22 NOTE — Telephone Encounter (Signed)
I don't even see an a1c, do you?

## 2018-05-22 NOTE — Telephone Encounter (Signed)
Patient called to inquire about her lab results. She stated that she is anxious to know because she said they will determine if her Diabetic medication will change. Asking for a call back ASAP please. Ph# [phone]

## 2018-05-22 NOTE — Telephone Encounter (Signed)
TA-Dr. Johnsie [name] did not order anything other than a CMP and an A1C cannot be completed from that tube due to the additives in it/if she was fasting then her Glucose is much improved but her kidney function is still off/I'm gonna see if she will come here for a lab visit for an A1C/Plz advise if you want anything else ordered with it/thx dmf

## 2018-05-23 LAB — URINE CULTURE
MICRO NUMBER:: 435464
SPECIMEN QUALITY:: ADEQUATE

## 2018-05-23 NOTE — Progress Notes (Signed)
Virtual Visit via Video   Due to the COVID-19 pandemic, this visit was completed with telemedicine (audio/video) technology to reduce patient and provider exposure as well as to preserve personal protective equipment.   I connected with [name] by a video enabled telemedicine application and verified that I am speaking with the correct person using two identifiers. Location patient: Home Location provider: [facility], Office Persons participating in the virtual visit: Dellanira W Suminski, Lesslie Mossa, MD   I discussed the limitations of evaluation and management by telemedicine and the availability of in person appointments. The patient expressed understanding and agreed to proceed.  Care Team   Patient Care Team: Lucille Passy, MD as PCP - General (Family Medicine)  Subjective:   HPI:   Dysuria- urine cx from 4/30 grew probable normal flora from external genitalia. Denies frequency. Usually when she gets a UTI it is full blown within a couple of days but this one is not as bad and has been 5 days. Denies any flank pain or fever.   She thinks may even be feeling a bit better.  Urine is now a clear color.  DM- On 4.30.20 her A1C was 10.0 and 27-month-ago was 8.4. Fasting FSBS this am was 178. Although the Prednisone has caused this to become more elevated it still is much higher than it was when she was on higher doses of Metformin.  Currently taking glucotrol 10 mg XL daily, Januvia 100 mg daily. Metformin 500 mg every morning.  We decreased her Metformin due to renal function.  Lab Results  Component Value Date   HGBA1C 10.0 (A) 05/22/2018   HGBA1C 10.0 05/22/2018   Lab Results  Component Value Date   CREATININE 1.61 (H) 05/16/2018      Review of Systems  Constitutional: Negative for chills, diaphoresis, fever, malaise/fatigue and weight loss.  HENT: Negative.   Eyes: Negative.   Respiratory: Negative.   Cardiovascular: Negative.   Gastrointestinal: Negative.    Genitourinary: Positive for dysuria. Negative for flank pain, frequency, hematuria and urgency.  Musculoskeletal: Negative.   Skin: Negative.   Neurological: Negative.   Endo/Heme/Allergies: Negative.   Psychiatric/Behavioral: Negative.   All other systems reviewed and are negative.    Patient Active Problem List   Diagnosis Date Noted  . Dysuria 05/26/2018  . Cough 04/07/2018  . Aortic stenosis 12/16/2017  . Chronic headaches 11/12/2017  . Iron deficiency anemia 11/12/2017  . Type 2 diabetes mellitus with diabetic neuropathy, unspecified ([facility] 09/10/2017  . Vitamin D deficiency 09/10/2017  . Vitamin B12 deficiency 09/10/2017  . Hyperkalemia 06/03/2017  . Chronic kidney disease (CKD), stage III (moderate) ([facility] 06/03/2017  . Fatigue 06/03/2017  . 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 Spinal stenosis of lumbar region 10/20/2014  . Mild aortic stenosis 12/29/2013  . Dense breasts 12/22/2012  . History of breast cancer 12/22/2012  . Carcinoma of breast treated with adjuvant chemotherapy ([facility]   . 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 female breast ([facility] 12/20/2010  . HYPERCHOLESTEROLEMIA 09/14/2008  . DEPRESSION 09/14/2008  . GERD 09/14/2008  . Type 2 diabetes, uncontrolled, with renal manifestation ([facility] 05/20/2008  . Essential hypertension 04/17/2007  . CAD S/P PCI 2007 04/17/2007  . ARTHRALGIA 04/17/2007  . Hypothyroidism 04/03/2007  . OTHER ABNORMAL BLOOD CHEMISTRY 12/03/2006    Social History   Tobacco Use  . Smoking status: Former Smoker    Packs/day: 2.00    Years: 40.00    Pack years: 80.00  Last attempt to quit: 12/19/1997    Years since quitting: 20.4  . Smokeless tobacco: Never Used  Substance Use Topics  . Alcohol use: Yes    Alcohol/week: 6.0 standard drinks    Types: 6 Glasses of wine per week    Comment: occasional    Current Outpatient Medications:  .  ACCU-CHEK GUIDE test strip, TEST ONCE DAILY, Disp: 100 each, Rfl: 5 .   aspirin 81 MG tablet, Take 1 tablet (81 mg total) by mouth daily. Resume 4 days post-op, Disp: , Rfl:  .  Cholecalciferol (VITAMIN D) 2000 units CAPS, Take 1 capsule by mouth daily., Disp: , Rfl:  .  colesevelam (WELCHOL) 625 MG tablet, Take 4 tablets (2,500 mg total) by mouth daily. (Patient taking differently: Take 1,250 mg by mouth daily. ), Disp: 360 tablet, Rfl: 3 .  ferrous sulfate (FERROUSUL) 325 (65 FE) MG tablet, Take 1 tablet (325 mg total) by mouth daily with breakfast., Disp: 30 tablet, Rfl: 3 .  glipiZIDE (GLUCOTROL XL) 10 MG 24 hr tablet, Take 1 tablet (10 mg total) by mouth daily with breakfast., Disp: 90 tablet, Rfl: 3 .  JANUVIA 100 MG tablet, TAKE 1 TABLET BY MOUTH  DAILY, Disp: 90 tablet, Rfl: 1 .  Lancet Device MISC, 100 each by Does not apply route daily. UAD for daily glucose monitoring, Disp: 100 each, Rfl: PRN .  Lancets Misc. (ACCU-CHEK MULTICLIX LANCET DEV) KIT, Use as directed to check sugars, Disp: , Rfl:  .  levothyroxine (SYNTHROID, LEVOTHROID) 75 MCG tablet, TAKE 1 TABLET BY MOUTH ONCE DAILY AT 6 AM, Disp: 90 tablet, Rfl: 2 .  losartan (COZAAR) 50 MG tablet, Take 1 tablet (50 mg total) by mouth daily., Disp: 90 tablet, Rfl: 3 .  Magnesium Cl-Calcium Carbonate (SLOW MAGNESIUM/CALCIUM PO), Take 1 tablet by mouth daily., Disp: , Rfl:  .  metFORMIN (GLUCOPHAGE) 500 MG tablet, Take 1 tablet (500 mg total) by mouth 2 (two) times daily with a meal., Disp: 180 tablet, Rfl: 3 .  nitroGLYCERIN (NITROSTAT) 0.4 MG SL tablet, Place 1 tablet (0.4 mg total) under the tongue every 5 (five) minutes as needed for chest pain., Disp: 25 tablet, Rfl: 3 .  Omega-3 Fatty Acids (OMEGA-3 FISH OIL) 1200 MG CAPS, 1,200 mg Two (2) times a day., Disp: , Rfl:  .  pantoprazole (PROTONIX) 40 MG tablet, TAKE 1 TABLET BY MOUTH  DAILY, Disp: 90 tablet, Rfl: 3 .  PARoxetine (PAXIL) 20 MG tablet, TAKE 1 TABLET BY MOUTH EVERY DAY IN THE MORNING, Disp: 90 tablet, Rfl: 1 .  valACYclovir (VALTREX) 1000 MG  tablet, TAKE 1 TABLET BY MOUTH 2  TIMES DAILY AS NEEDED FOR  VIRAL INFECTION., Disp: 180 tablet, Rfl: 0 .  vitamin B-12 (CYANOCOBALAMIN) 500 MCG tablet, Take 500 mcg by mouth daily., Disp: , Rfl:   Allergies  Allergen Reactions  . Codeine Nausea And Vomiting and Other (See Comments)    Severe stomach cramps  . Antihistamines, Diphenhydramine-Type Other (See Comments)    Causes hyperactivity  . Gabapentin     Urinary incontinence  . Statins Other (See Comments)    Joint pains  . Erythromycin Hives and Rash    Due to dental work about 50 years ago. Was used in packing and resulted in rash/hives inside and outside of mouth.    Objective:  Ht '5\' 3"'  (1.6 m)   BMI 26.71 kg/m   VITALS: Per patient if applicable, see vitals. GENERAL: Alert, appears well and in no acute distress.  HEENT: Atraumatic, conjunctiva clear, no obvious abnormalities on inspection of external nose and ears. NECK: Normal movements of the head and neck. CARDIOPULMONARY: No increased WOB. Speaking in clear sentences. I:E ratio WNL.  MS: Moves all visible extremities without noticeable abnormality. PSYCH: Pleasant and cooperative, well-groomed. Speech normal rate and rhythm. Affect is appropriate. Insight and judgement are appropriate. Attention is focused, linear, and appropriate.  NEURO: CN grossly intact. Oriented as arrived to appointment on time with no prompting. Moves both UE equally.  SKIN: No obvious lesions, wounds, erythema, or cyanosis noted on face or hands.  Depression screen [facility] 2/9 03/31/2018 03/04/2017  Decreased Interest 0 0  Down, Depressed, Hopeless 0 0  PHQ - 2 Score 0 0  Some recent data might be hidden    Assessment and Plan:   Rilya was seen today for dysuria and diabetes.  Diagnoses and all orders for this visit:  Type 2 diabetes mellitus with diabetic neuropathy, without long-term current use of insulin (HCC)  Dysuria  Other orders -     metFORMIN (GLUCOPHAGE) 500 MG tablet; Take 1  tablet (500 mg total) by mouth 2 (two) times daily with a meal.    . COVID-19 Education: The signs and symptoms of COVID-19 were discussed with the patient and how to seek care for testing if needed. The importance of social distancing was discussed today. . Reviewed expectations re: course of current medical issues. . Discussed self-management of symptoms. . Outlined signs and symptoms indicating need for more acute intervention. . Patient verbalized understanding and all questions were answered. Marland Kitchen Health Maintenance issues including appropriate healthy diet, exercise, and smoking avoidance were discussed with patient. . See orders for this visit as documented in the electronic medical record.  Arnette Norris, MD  Records requested if needed. Time spent: 25 minutes, of which >50% was spent in obtaining information about her symptoms, reviewing her previous labs, evaluations, and treatments, counseling her about her condition (please see the discussed topics above), and developing a plan to further investigate it; she had a number of questions which I addressed.

## 2018-05-26 ENCOUNTER — Ambulatory Visit (INDEPENDENT_AMBULATORY_CARE_PROVIDER_SITE_OTHER): Payer: Medicare Other | Admitting: Family Medicine

## 2018-05-26 VITALS — Ht 63.0 in

## 2018-05-26 DIAGNOSIS — E1140 Type 2 diabetes mellitus with diabetic neuropathy, unspecified: Secondary | ICD-10-CM

## 2018-05-26 DIAGNOSIS — R300 Dysuria: Secondary | ICD-10-CM | POA: Diagnosis not present

## 2018-05-26 DIAGNOSIS — IMO0002 Reserved for concepts with insufficient information to code with codable children: Secondary | ICD-10-CM

## 2018-05-26 DIAGNOSIS — E1165 Type 2 diabetes mellitus with hyperglycemia: Secondary | ICD-10-CM

## 2018-05-26 DIAGNOSIS — E1129 Type 2 diabetes mellitus with other diabetic kidney complication: Secondary | ICD-10-CM | POA: Diagnosis not present

## 2018-05-26 MED ORDER — METFORMIN HCL 500 MG PO TABS: 500.0000 mg | ORAL_TABLET | Freq: Two times a day (BID) | ORAL | 3 refills | Status: DC

## 2018-05-26 NOTE — Assessment & Plan Note (Signed)
>  25 minutes spent in face to face time with patient, >50% spent in counselling or coordination of care discussing diabetes treatment and her dysuria symptoms.  We discussed tx options and she does not want to try an injectable or insulin.  We could increase her metformin to 500 mg twice daily for a short period of time but she would need to follow up with me in 1 month for labs. The patient indicates understanding of these issues and agrees with the plan.

## 2018-05-26 NOTE — Assessment & Plan Note (Signed)
Improving and urine cx neg and reassuring. Dysuria may be due to bladder irritants... Drink water, avoid alcohol, caffeine, soda, citris, tomato, spicy foods. She will push fluids and Call or send my chart message prn if these symptoms worsen or fail to improve as anticipated.The patient indicates understanding of these issues and agrees with the plan.

## 2018-05-29 ENCOUNTER — Telehealth: Payer: Self-pay

## 2018-05-29 NOTE — Telephone Encounter (Signed)
Copied from [place] [id number]. Topic: General - Other >> May 29, 2018 11:04 AM Carolyn Stare wrote:  Pt said she spoke earlier in the week to Dr Deborra Medina about a UTI and now req a call back

## 2018-05-30 MED ORDER — CEPHALEXIN 500 MG PO CAPS: 500.0000 mg | ORAL_CAPSULE | Freq: Two times a day (BID) | ORAL | 0 refills | Status: AC

## 2018-05-30 NOTE — Addendum Note (Signed)
Addended by: Marrion Coy on: 05/30/2018 11:34 AM   Modules accepted: Orders

## 2018-05-30 NOTE — Telephone Encounter (Signed)
Pt stated her pain level has increased and that on Monday Dr. Deborra Medina offered to send in an antibiotic. She stated she thinks going into the weekend she will need that antibiotic now. She would like a return call. Please advise. CB#[phone]

## 2018-05-30 NOTE — Telephone Encounter (Signed)
TA-Pt states that her stomach has not tolerated the extra Metformin well/I suggested that she take her normal one in the morning and then maybe try to take 1/2 of one at bedtime just to see if it still bothers her tummy/she agreed to try that and let us know how she does next week  She is also aware of the Keflex bid x7d and says thank you/thx dmf

## 2018-05-30 NOTE — Telephone Encounter (Signed)
LMOVM for pt to RTC/I need to know what her Sx are/painful urination; frequent urination; pelvic pain; lower back pain; and if does have lower back pain does it come in waves or is it constant; thx dmf

## 2018-05-30 NOTE — Telephone Encounter (Signed)
Noted.  Let's try Keflex 500 mg twice daily x 7 days.  #14 with no refills!  Thank you!

## 2018-05-30 NOTE — Telephone Encounter (Signed)
TA-Pt had Virtual Visit and at that time she decided to wait on an abx for UTI/her Sx have worsened and she is requesting for that to be sent in for her/plz advise and I would be happy to send this in for you/thx dmf

## 2018-06-01 ENCOUNTER — Other Ambulatory Visit: Payer: Self-pay | Admitting: Family Medicine

## 2018-06-11 ENCOUNTER — Telehealth: Payer: Self-pay | Admitting: Cardiovascular Disease

## 2018-06-11 NOTE — Telephone Encounter (Signed)
New message     Called to convert 06-24-18 ov to a virtual video visit.  Patient gave consent to video visit.  However, pt states that her 05-26-18 Doxy Me visit with her PCP did not work but, she is willing to try again.  YOUR CARDIOLOGY TEAM HAS ARRANGED FOR AN E-VISIT FOR YOUR APPOINTMENT - PLEASE REVIEW IMPORTANT INFORMATION BELOW SEVERAL DAYS PRIOR TO YOUR APPOINTMENT  Due to the recent COVID-19 pandemic, we are transitioning in-person office visits to tele-medicine visits in an effort to decrease unnecessary exposure to our patients, their families, and staff. These visits are billed to your insurance just like a normal visit is. We also encourage you to sign up for MyChart if you have not already done so. You will need a smartphone if possible. For patients that do not have this, we can still complete the visit using a regular telephone but do prefer a smartphone to enable video when possible. You may have a family member that lives with you that can help. If possible, we also ask that you have a blood pressure cuff and scale at home to measure your blood pressure, heart rate and weight prior to your scheduled appointment. Patients with clinical needs that need an in-person evaluation and testing will still be able to come to the office if absolutely necessary. If you have any questions, feel free to call our office.     YOUR PROVIDER WILL BE USING THE FOLLOWING PLATFORM TO COMPLETE YOUR VISIT: Doxy Me   IF USING MYCHART - How to Download the MyChart App to Your SmartPhone   - If Apple, go to [employer] and type in MyChart in the search bar and download the app. If Android, ask patient to go to [employer] and type in [place] in the search bar and download the app. The app is free but as with any other app downloads, your phone may require you to verify saved payment information or Apple/Android password.  - You will need to then log into the app with your MyChart username and password,  and select [facility] as your healthcare provider to link the account.  - When it is time for your visit, go to the MyChart app, find appointments, and click Begin Video Visit. Be sure to Select Allow for your device to access the Microphone and Camera for your visit. You will then be connected, and your provider will be with you shortly.  **If you have any issues connecting or need assistance, please contact MyChart service desk [phone])**  **If using a computer, in order to ensure the best quality for your visit, you will need to use either of the following Internet Browsers: Insurance underwriter or Microsoft Edge**   IF USING DOXIMITY or [email or web address] - The staff will give you instructions on receiving your link to join the meeting the day of your visit.      2-3 DAYS BEFORE YOUR APPOINTMENT  You will receive a telephone call from one of our [place] team members - your caller ID may say "Unknown caller." If this is a video visit, we will walk you through how to get the video launched on your phone. We will remind you check your blood pressure, heart rate and weight prior to your scheduled appointment. If you have an Apple Watch or Kardia, please upload any pertinent ECG strips the day before or morning of your appointment to [facility]. Our staff will also make sure you have reviewed the consent  and agree to move forward with your scheduled tele-health visit.     THE DAY OF YOUR APPOINTMENT  Approximately 15 minutes prior to your scheduled appointment, you will receive a telephone call from one of [facility] team - your caller ID may say "Unknown caller."  Our staff will confirm medications, vital signs for the day and any symptoms you may be experiencing. Please have this information available prior to the time of visit start. It may also be helpful for you to have a pad of paper and pen handy for any instructions given during your visit. They will also walk you through joining the  smartphone meeting if this is a video visit.    CONSENT FOR TELE-HEALTH VISIT - PLEASE REVIEW  I hereby voluntarily request, consent and authorize [facility] and its employed or contracted physicians, physician assistants, nurse practitioners or other licensed health care professionals (the Practitioner), to provide me with telemedicine health care services (the Services") as deemed necessary by the treating Practitioner. I acknowledge and consent to receive the Services by the Practitioner via telemedicine. I understand that the telemedicine visit will involve communicating with the Practitioner through live audiovisual communication technology and the disclosure of certain medical information by electronic transmission. I acknowledge that I have been given the opportunity to request an in-person assessment or other available alternative prior to the telemedicine visit and am voluntarily participating in the telemedicine visit.  I understand that I have the right to withhold or withdraw my consent to the use of telemedicine in the course of my care at any time, without affecting my right to future care or treatment, and that the Practitioner or I may terminate the telemedicine visit at any time. I understand that I have the right to inspect all information obtained and/or recorded in the course of the telemedicine visit and may receive copies of available information for a reasonable fee.  I understand that some of the potential risks of receiving the Services via telemedicine include:   Delay or interruption in medical evaluation due to technological equipment failure or disruption;  Information transmitted may not be sufficient (e.g. poor resolution of images) to allow for appropriate medical decision making by the Practitioner; and/or   In rare instances, security protocols could fail, causing a breach of personal health information.  Furthermore, I acknowledge that it is my responsibility  to provide information about my medical history, conditions and care that is complete and accurate to the best of my ability. I acknowledge that Practitioner's advice, recommendations, and/or decision may be based on factors not within their control, such as incomplete or inaccurate data provided by me or distortions of diagnostic images or specimens that may result from electronic transmissions. I understand that the practice of medicine is not an exact science and that Practitioner makes no warranties or guarantees regarding treatment outcomes. I acknowledge that I will receive a copy of this consent concurrently upon execution via email to the email address I last provided but may also request a printed copy by calling the office of [facility].    I understand that my insurance will be billed for this visit.   I have read or had this consent read to me.  I understand the contents of this consent, which adequately explains the benefits and risks of the Services being provided via telemedicine.   I have been provided ample opportunity to ask questions regarding this consent and the Services and have had my questions answered to my satisfaction.  I give my informed consent for the services to be provided through the use of telemedicine in my medical care  By participating in this telemedicine visit I agree to the above.

## 2018-06-19 NOTE — Progress Notes (Signed)
Virtual Visit via Video Note   This visit type was conducted due to national recommendations for restrictions regarding the COVID-19 Pandemic (e.g. social distancing) in an effort to limit this patient's exposure and mitigate transmission in our community.  Due to her co-morbid illnesses, this patient is at least at moderate risk for complications without adequate follow up.  This format is felt to be most appropriate for this patient at this time.  All issues noted in this document were discussed and addressed.  A limited physical exam was performed with this format.  Please refer to the patient's chart for her consent to telehealth for [facility].   Date:  06/24/2018   ID:  [name], [name] 02-14-1940, MRN [id number]  Patient Location: Home Provider Location: Office  PCP:  Lucille Passy, MD  Cardiologist:   Johnsie Cancel Electrophysiologist:  None   Evaluation Performed:  Follow-Up Visit  Chief Complaint:  CAD  History of Present Illness:    78 y.o.  female with a history of CAD, s/p RCA HSRA and DES (Taxus) in 2007. Cath in 2008 showed patent RCA and no significant Lt system disease. She has done well from a cardiac standpoint. She had breast cancer in 2012 and has not had recurrence. She denies any palpitations, dyspnea, or chest pain. She has chronic left bruit with duplex  May 2019 showing plaque no stenosis    Echo 12/11/17 with EF 55-60% moderate AS mean gradient 19 mmHg peak 35 mmHg and moderate AR DVI 0.32   Myovue reviewed 11/22/15 normal no ischemia EF 79%   Had uncomplicated lap hysterectomy Dr Julien Girt 04/05/16    Pain in legs seems to be from , arthritis and neuropathy in feet Has had cortisone shots in knees with good relief and lone in her Back for sciatica ABI"s 01/17/17 normal circulation  No cardiac symptoms   The patient  does not have symptoms concerning for COVID-19 infection (fever, chills, cough, or new shortness of breath).    Past Medical History:   Diagnosis Date  . Allergy   . Anemia    childhood  . Anxiety   . Arterial stenosis (HCC)    mesenteric  . Arthritis   . Breast cancer ([name]) 12/05/10   R breast, inv mammary, in situ,ER/PR +,HER2 -  . Cancer ([place])   . Carcinoma of breast treated with adjuvant chemotherapy ([place])   . Coronary artery disease    stent 2007  . Depression   . Diabetes mellitus   . Difficulty sleeping   . Diverticulosis 12/10/03  . Elevated cholesterol   . Generalized weakness   . GERD (gastroesophageal reflux disease)   . Heart murmur    secondary to chem. 2013  . Hepatitis    as an infant  . HSV-1 (herpes simplex virus 1) infection   . Hyperlipidemia   . Hypertension   . Hypothyroidism   . Osteoarthritis   . Personal history of chemotherapy   . Personal history of radiation therapy   . Pneumonia    2014 september  . 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 Spinal stenosis   . Thyroid disease    Past Surgical History:  Procedure Laterality Date  . ANTERIOR AND POSTERIOR REPAIR N/A 04/05/2016   Procedure: ANTERIOR (CYSTOCELE) AND POSTERIOR REPAIR (RECTOCELE);  Surgeon: [name], MD;  Location: [facility];  Service: Gynecology;  Laterality: N/A;  . BREAST BIOPSY    . [place]  benign biopsy  . BREAST LUMPECTOMY Right 12/29/2010   snbx, ER/PR +, Her2 -, 0/1 node pos.  . CAROTID STENT  2007  . CARPAL TUNNEL RELEASE Bilateral 9/12,8,12  . CHOLECYSTECTOMY    . COLONOSCOPY N/A 02/09/2013   Procedure: COLONOSCOPY;  Surgeon: Dora M Brodie, MD;  Location: [facility];  Service: Endoscopy;  Laterality: N/A;  . COLONOSCOPY    . CORONARY ANGIOPLASTY    . DILATION AND CURETTAGE OF UTERUS    . ESOPHAGOGASTRODUODENOSCOPY N/A 02/09/2013   Procedure: ESOPHAGOGASTRODUODENOSCOPY (EGD);  Surgeon: Dora M Brodie, MD;  Location: [facility];  Service: Endoscopy;  Laterality: N/A;  . FOOT SPURS    . HYSTEROSCOPY W/D&C N/A 09/11/2012   Procedure: DILATATION AND CURETTAGE ;   Surgeon: Gretchen Adkins, MD;  Location: [facility];  Service: Gynecology;  Laterality: N/A;  . KNEE SURGERY  2007  . LAPAROSCOPIC ASSISTED VAGINAL HYSTERECTOMY N/A 04/05/2016   Procedure: LAPAROSCOPIC ASSISTED VAGINAL HYSTERECTOMY possible BSO;  Surgeon: Gretchen Adkins, MD;  Location: [facility];  Service: Gynecology;  Laterality: N/A;  . LUMBAR LAMINECTOMY/DECOMPRESSION MICRODISCECTOMY N/A 10/20/2014   Procedure: MICRO LUMBER DECOMPRESSION L3-4 L4-5;  Surgeon: Jeffrey Beane, MD;  Location: [facility];  Service: Orthopedics;  Laterality: N/A;  . LUMBAR LAMINECTOMY/DECOMPRESSION MICRODISCECTOMY Bilateral 10/13/2015   Procedure: MICRO LUMBAR DECOMPRESSION L5 - S1 AND REDO DECOMPRESSION L4 - L5 AND REMOVAL OF FACET CYST L4 - L5 2 LEVELS;  Surgeon: Jeffrey Beane, MD;  Location: [facility];  Service: Orthopedics;  Laterality: Bilateral;  . PORT-A-CATH REMOVAL  04/17/2011   Procedure: REMOVAL PORT-A-CATH;  Surgeon: Matthew Wakefield, MD;  Location: [facility];  Service: General;  Laterality: Left;  . PORTACATH PLACEMENT  02/07/2011   Procedure: INSERTION PORT-A-CATH;  Surgeon: Matthew Wakefield, MD;  Location: [facility];  Service: General;  Laterality: N/A;  . UPPER GASTROINTESTINAL ENDOSCOPY       Current Meds  Medication Sig  . ACCU-CHEK GUIDE test strip TEST ONCE DAILY  . aspirin 81 MG tablet Take 1 tablet (81 mg total) by mouth daily. Resume 4 days post-op  . Cholecalciferol (VITAMIN D) 2000 units CAPS Take 1 capsule by mouth daily.  . colesevelam (WELCHOL) 625 MG tablet Take 4 tablets (2,500 mg total) by mouth daily. (Patient taking differently: Take 1,250 mg by mouth daily. )  . ferrous sulfate (FERROUSUL) 325 (65 FE) MG tablet Take 1 tablet (325 mg total) by mouth daily with breakfast.  . glipiZIDE (GLUCOTROL XL) 10 MG 24 hr tablet Take 1 tablet (10 mg total) by mouth daily with breakfast.  . JANUVIA 100 MG tablet TAKE 1 TABLET BY MOUTH  DAILY  . Lancet Device MISC 100 each by Does not apply route  daily. UAD for daily glucose monitoring  . Lancets Misc. (ACCU-CHEK MULTICLIX LANCET DEV) KIT Use as directed to check sugars  . levothyroxine (SYNTHROID, LEVOTHROID) 75 MCG tablet TAKE 1 TABLET BY MOUTH ONCE DAILY AT 6 AM  . losartan (COZAAR) 50 MG tablet Take 1 tablet (50 mg total) by mouth daily.  . Magnesium Cl-Calcium Carbonate (SLOW MAGNESIUM/CALCIUM PO) Take 1 tablet by mouth daily.  . metFORMIN (GLUCOPHAGE) 500 MG tablet Take 1 tablet (500 mg total) by mouth 2 (two) times daily with a meal.  . nitroGLYCERIN (NITROSTAT) 0.4 MG SL tablet Place 1 tablet (0.4 mg total) under the tongue every 5 (five) minutes as needed for chest pain.  . Omega-3 Fatty Acids (OMEGA-3 FISH OIL) 1200 MG CAPS 1,200 mg Two (2) times a day.  .   pantoprazole (PROTONIX) 40 MG tablet TAKE 1 TABLET BY MOUTH  DAILY  . PARoxetine (PAXIL) 20 MG tablet TAKE 1 TABLET BY MOUTH EVERY DAY IN THE MORNING  . valACYclovir (VALTREX) 1000 MG tablet TAKE 1 TABLET BY MOUTH 2  TIMES DAILY AS NEEDED FOR  VIRAL INFECTION.  . vitamin B-12 (CYANOCOBALAMIN) 500 MCG tablet Take 500 mcg by mouth daily.     Allergies:   Codeine; Antihistamines, diphenhydramine-type; Gabapentin; Statins; and Erythromycin   Social History   Tobacco Use  . Smoking status: Former Smoker    Packs/day: 2.00    Years: 40.00    Pack years: 80.00    Last attempt to quit: 12/19/1997    Years since quitting: 20.5  . Smokeless tobacco: Never Used  Substance Use Topics  . Alcohol use: Yes    Alcohol/week: 6.0 standard drinks    Types: 6 Glasses of wine per week    Comment: occasional  . Drug use: No     Family Hx: The patient's family history includes Alcoholism in her father; Cancer in her son; Diabetes in her brother; Heart attack in her brother and father; Heart disease in her mother; Hypertension in her brother; Lymphoma (age of onset: 64) in her brother; Throat cancer in her father. There is no history of Colon cancer, Stroke, Esophageal cancer, Rectal  cancer, or Stomach cancer.  ROS:   Please see the history of present illness.     All other systems reviewed and are negative.   Prior CV studies:   The following studies were reviewed today:  Echo 12/11/17 ABI's 01/17/17 Carotid:  06/05/17   Labs/Other Tests and Data Reviewed:    EKG:   SR LVH PVC lateral T wave changes   Recent Labs: 12/16/2017: TSH 3.12 04/07/2018: Hemoglobin 13.2; Platelets 275.0; Pro B Natriuretic peptide (BNP) 82.0 05/16/2018: ALT 22; BUN 27; Creatinine, Ser 1.61; Potassium 5.0; Sodium 141   Recent Lipid Panel Lab Results  Component Value Date/Time   CHOL 201 (H) 12/16/2017 08:36 AM   TRIG 256.0 (H) 12/16/2017 08:36 AM   HDL 34.30 (L) 12/16/2017 08:36 AM   CHOLHDL 6 12/16/2017 08:36 AM   LDLCALC 83 05/13/2008 09:29 AM   LDLDIRECT 138.0 12/16/2017 08:36 AM    Wt Readings from Last 3 Encounters:  06/24/18 65.8 kg  04/07/18 68.4 kg  03/31/18 68.3 kg     Objective:    Vital Signs:  BP (!) 166/64   Ht 5' 3.5" (1.613 m)   Wt 65.8 kg   BMI 25.28 kg/m    Telephone visit no exam   ASSESSMENT & PLAN:    CAD S/P PCI 2007 RCA HSRA and Taxus stent in 2007 by Dr Lia Foyer.  Re look cath in 2008 OK, no Lt system disease and RCA patent  11/22/15 myovue normal ef 59% Continue medical Rx    Bruit:  [place] no stenosis duplex 06/05/17 ASA f/u May 2021  Essential hypertension Well controlled.  Continue current medications and low sodium Dash type diet.     Elevated lipids On Welchol, statin intolerant- labs with primary LFTls normal March 2018   Moderate  aortic stenosis Stable LV size/function mean gradient 18 mmHg 12/11/17 moderate AR f/u echo November   History of breast cancer Rt mastectomy Dec 2012 followed by chemo and radiation  Spinal stenosis of lumbar region Surgery Sept 2016 suspect her leg pain is from this  ABI's normal 06/13/15  GERD :  Continue protonix low carb diet finished with carafate  OB/GYN:  Post hysterectomy on  04/05/16  No cardiac complications f/u Dr Adkins    COVID-19 Education: The signs and symptoms of COVID-19 were discussed with the patient and how to seek care for testing (follow up with PCP or arrange E-visit).  The importance of social distancing was discussed today.  Time:   Today, I have spent 30 minutes with the patient with telehealth technology discussing the above problems.     Medication Adjustments/Labs and Tests Ordered: Current medicines are reviewed at length with the patient today.  Concerns regarding medicines are outlined above.   Tests Ordered:  Echo November 2020 AS/AR  Medication Changes:  None   Disposition:  Follow up in a year  Signed, Peter Nishan, MD  06/24/2018 10:00 AM    [facility]  

## 2018-06-24 ENCOUNTER — Telehealth (INDEPENDENT_AMBULATORY_CARE_PROVIDER_SITE_OTHER): Payer: Medicare Other | Admitting: Cardiovascular Disease

## 2018-06-24 ENCOUNTER — Other Ambulatory Visit: Payer: Self-pay

## 2018-06-24 ENCOUNTER — Encounter: Payer: Self-pay | Admitting: Cardiovascular Disease

## 2018-06-24 VITALS — BP 166/64 | Ht 63.5 in | Wt 145.0 lb

## 2018-06-24 DIAGNOSIS — I350 Nonrheumatic aortic (valve) stenosis: Secondary | ICD-10-CM

## 2018-06-24 DIAGNOSIS — I2510 Atherosclerotic heart disease of native coronary artery without angina pectoris: Secondary | ICD-10-CM

## 2018-06-24 NOTE — Patient Instructions (Addendum)
Medication Instructions:   If you need a refill on your cardiac medications before your next appointment, please call your pharmacy.   Lab work:  If you have labs (blood work) drawn today and your tests are completely normal, you will receive your results only by: Marland Kitchen MyChart Message (if you have MyChart) OR . A paper copy in the mail If you have any lab test that is abnormal or we need to change your treatment, we will call you to review the results.  Testing/Procedures: Your physician has requested that you have an echocardiogram in November with office visit. Echocardiography is a painless test that uses sound waves to create images of your heart. It provides your doctor with information about the size and shape of your heart and how well your heart's chambers and valves are working. This procedure takes approximately one hour. There are no restrictions for this procedure.  Follow-Up: At [facility], you and your health needs are our priority.  As part of our continuing mission to provide you with exceptional heart care, we have created designated Provider Care Teams.  These Care Teams include your primary Cardiologist (physician) and Advanced Practice Providers (APPs -  Physician Assistants and Nurse Practitioners) who all work together to provide you with the care you need, when you need it. You will need a follow up appointment in November with echocardiogram same day.  Please call our office 2 months in advance to schedule this appointment.  You may see Dr. Johnsie Cancel or one of the following Advanced Practice Providers on your designated Care Team:   Truitt Merle, NP Cecilie Kicks, NP . Kathyrn Drown, NP

## 2018-07-21 ENCOUNTER — Ambulatory Visit (INDEPENDENT_AMBULATORY_CARE_PROVIDER_SITE_OTHER): Payer: Medicare Other | Admitting: Family Medicine

## 2018-07-21 ENCOUNTER — Encounter: Payer: Self-pay | Admitting: Family Medicine

## 2018-07-21 VITALS — BP 136/82 | HR 88 | Temp 98.4°F | Ht 64.0 in | Wt 148.8 lb

## 2018-07-21 DIAGNOSIS — IMO0002 Reserved for concepts with insufficient information to code with codable children: Secondary | ICD-10-CM

## 2018-07-21 DIAGNOSIS — E1165 Type 2 diabetes mellitus with hyperglycemia: Secondary | ICD-10-CM | POA: Diagnosis not present

## 2018-07-21 DIAGNOSIS — N390 Urinary tract infection, site not specified: Secondary | ICD-10-CM | POA: Insufficient documentation

## 2018-07-21 DIAGNOSIS — S50862A Insect bite (nonvenomous) of left forearm, initial encounter: Secondary | ICD-10-CM

## 2018-07-21 DIAGNOSIS — E1140 Type 2 diabetes mellitus with diabetic neuropathy, unspecified: Secondary | ICD-10-CM | POA: Diagnosis not present

## 2018-07-21 DIAGNOSIS — N3000 Acute cystitis without hematuria: Secondary | ICD-10-CM

## 2018-07-21 DIAGNOSIS — W57XXXA Bitten or stung by nonvenomous insect and other nonvenomous arthropods, initial encounter: Secondary | ICD-10-CM

## 2018-07-21 DIAGNOSIS — E1129 Type 2 diabetes mellitus with other diabetic kidney complication: Secondary | ICD-10-CM | POA: Diagnosis not present

## 2018-07-21 DIAGNOSIS — I10 Essential (primary) hypertension: Secondary | ICD-10-CM | POA: Diagnosis not present

## 2018-07-21 DIAGNOSIS — L089 Local infection of the skin and subcutaneous tissue, unspecified: Secondary | ICD-10-CM | POA: Insufficient documentation

## 2018-07-21 DIAGNOSIS — R300 Dysuria: Secondary | ICD-10-CM | POA: Diagnosis not present

## 2018-07-21 DIAGNOSIS — E7800 Pure hypercholesterolemia, unspecified: Secondary | ICD-10-CM | POA: Diagnosis not present

## 2018-07-21 LAB — POCT URINALYSIS DIPSTICK
Bilirubin, UA: NEGATIVE
Blood, UA: NEGATIVE
Glucose, UA: NEGATIVE
Ketones, UA: NEGATIVE
Leukocytes, UA: NEGATIVE
Nitrite, UA: NEGATIVE
Protein, UA: POSITIVE — AB
Spec Grav, UA: 1.015 (ref 1.010–1.025)
Urobilinogen, UA: 0.2 E.U./dL
pH, UA: 6 (ref 5.0–8.0)

## 2018-07-21 LAB — LIPID PANEL
Cholesterol: 214 mg/dL — ABNORMAL HIGH (ref 0–200)
HDL: 36.1 mg/dL — ABNORMAL LOW (ref >39.00)
NonHDL: 178.21
Total CHOL/HDL Ratio: 6
Triglycerides: 265 mg/dL — ABNORMAL HIGH (ref 0.0–149.0)
VLDL: 53 mg/dL — ABNORMAL HIGH (ref 0.0–40.0)

## 2018-07-21 LAB — COMPREHENSIVE METABOLIC PANEL
ALT: 19 U/L (ref 0–35)
AST: 20 U/L (ref 0–37)
Albumin: 4 g/dL (ref 3.5–5.2)
Alkaline Phosphatase: 74 U/L (ref 39–117)
BUN: 24 mg/dL — ABNORMAL HIGH (ref 6–23)
CO2: 24 mEq/L (ref 19–32)
Calcium: 9.4 mg/dL (ref 8.4–10.5)
Chloride: 103 mEq/L (ref 96–112)
Creatinine, Ser: 1.46 mg/dL — ABNORMAL HIGH (ref 0.40–1.20)
GFR: 34.86 mL/min — ABNORMAL LOW (ref >60.00)
Glucose, Bld: 209 mg/dL — ABNORMAL HIGH (ref 70–99)
Potassium: 5.4 mEq/L — ABNORMAL HIGH (ref 3.5–5.1)
Sodium: 138 mEq/L (ref 135–145)
Total Bilirubin: 0.3 mg/dL (ref 0.2–1.2)
Total Protein: 7.1 g/dL (ref 6.0–8.3)

## 2018-07-21 LAB — HEMOGLOBIN A1C: Hgb A1c MFr Bld: 9.1 % — ABNORMAL HIGH (ref 4.6–6.5)

## 2018-07-21 LAB — LDL CHOLESTEROL, DIRECT: Direct LDL: 137 mg/dL

## 2018-07-21 MED ORDER — CIPROFLOXACIN HCL 250 MG PO TABS: 250.0000 mg | ORAL_TABLET | Freq: Two times a day (BID) | ORAL | 0 refills | Status: DC

## 2018-07-21 MED ORDER — CLOBETASOL PROPIONATE 0.05 % EX CREA: 1.0000 "application " | TOPICAL_CREAM | Freq: Two times a day (BID) | CUTANEOUS | 0 refills | Status: DC

## 2018-07-21 NOTE — Assessment & Plan Note (Signed)
>  40 minutes spent in face to face time with patient, >50% spent in counselling or coordination of care discussing DM, UTI, bug bite, hypothyroidism. She feels like she even has more energy on lower dose metformin, diarrhea has resolved. We agreed to check labs today and make a decision based on her medications at that time.  Continue current rxs for now. The patient indicates understanding of these issues and agrees with the plan. Orders Placed This Encounter  Procedures  . Urine Culture  . HgB A1c  . Comp Met (CMET)  . Lipid panel  . POCT urinalysis dipstick

## 2018-07-21 NOTE — Assessment & Plan Note (Signed)
Well controlled on current rxs. No changes made. 

## 2018-07-21 NOTE — Progress Notes (Signed)
Subjective:   Patient ID: [name], female    DOB: 11-Jul-1940, 78 y.o.   MRN: [id number]  [name] is a pleasant 78 y.o. year old female who presents to clinic today with Follow-up (Pt screened at vehicle. She is here today for a 110-monthF/U.  On 4.30.20 her A1C was 10.0 and 249-monthprior to that was 8.4. Due to the increase in A1C her Metformin was increased to 50061mbid but she went back down to 1qd due to her stomach), Insect Bite (Pt states that she was bit by something 1-week-ago while gardening on left forearm.  It is swollen, red, and itches.), and Dysuria (She also is C/O dysuria x1.5 wks. She has a lot of pressure, urgency, frequency but denies any flank pain.)  on 07/21/2018  HPI: DM- Chart reviewed.  On 4.30.20 her A1C was 10.0 and 2-m5-monthior to that was 8.4. Due to the increase in A1C her Metformin was increased to 500mg22md but she went back down to 1 tablet daily due to GI side effects. She is also taking Januvia 100 mg daily and glucotrol XL 10 mg daily.   Checks fasting FSBS 3 times a week- running 100s- low 200s.  Lab Results  Component Value Date   HGBA1C 10.0 (A) 05/22/2018   HGBA1C 10.0 05/22/2018   HTN- on ARB for HTN and for renal protection.  Lab Results  Component Value Date   CREATININE 1.61 (H) 05/16/2018     BP Readings from Last 3 Encounters:  07/21/18 136/82  06/24/18 (!) 166/64  04/07/18 136/84   Bug bite-  Noticed it a week ago, not larger, very itchy.  Has tried topical benadryl cream without relief.  Not sure what bit her. Has not grown in size.  No fever, nausea or vomiting.  Hypothyroidism- clinically euthyroid on synthroid 75 mcg daily.  Lab Results  Component Value Date   TSH 3.12 12/16/2017   Lab Results  Component Value Date   CHOL 201 (H) 12/16/2017   HDL 34.30 (L) 12/16/2017   LDLCALC 83 05/13/2008   LDLDIRECT 138.0 12/16/2017   TRIG 256.0 (H) 12/16/2017   CHOLHDL 6 12/16/2017   ?UTI- one week and half  weeks of suprapubic pressure and incomplete bladder emptying.  No hematuria.  No nausea, vomiting or fever.  No dysuria. Feels exactly like previous UTIs.  Current Outpatient Medications on File Prior to Visit  Medication Sig Dispense Refill  . ACCU-CHEK GUIDE test strip TEST ONCE DAILY 100 each 5  . aspirin 81 MG tablet Take 1 tablet (81 mg total) by mouth daily. Resume 4 days post-op    . Cholecalciferol (VITAMIN D) 2000 units CAPS Take 1 capsule by mouth daily.    . colesevelam (WELCHOL) 625 MG tablet Take 4 tablets (2,500 mg total) by mouth daily. (Patient taking differently: Take 1,250 mg by mouth daily. ) 360 tablet 3  . ferrous sulfate (FERROUSUL) 325 (65 FE) MG tablet Take 1 tablet (325 mg total) by mouth daily with breakfast. 30 tablet 3  . glipiZIDE (GLUCOTROL XL) 10 MG 24 hr tablet Take 1 tablet (10 mg total) by mouth daily with breakfast. 90 tablet 3  . JANUVIA 100 MG tablet TAKE 1 TABLET BY MOUTH  DAILY 90 tablet 1  . Lancet Device MISC 100 each by Does not apply route daily. UAD for daily glucose monitoring 100 each PRN  . Lancets Misc. (ACCU-CHEK MULTICLIX LANCET DEV) KIT Use as directed to check sugars    .  levothyroxine (SYNTHROID, LEVOTHROID) 75 MCG tablet TAKE 1 TABLET BY MOUTH ONCE DAILY AT 6 AM 90 tablet 2  . losartan (COZAAR) 50 MG tablet Take 1 tablet (50 mg total) by mouth daily. 90 tablet 3  . Magnesium Cl-Calcium Carbonate (SLOW MAGNESIUM/CALCIUM PO) Take 1 tablet by mouth daily.    . metFORMIN (GLUCOPHAGE) 500 MG tablet Take 1 tablet (500 mg total) by mouth 2 (two) times daily with a meal. (Patient taking differently: Take 500 mg by mouth daily with breakfast. ) 180 tablet 3  . nitroGLYCERIN (NITROSTAT) 0.4 MG SL tablet Place 1 tablet (0.4 mg total) under the tongue every 5 (five) minutes as needed for chest pain. 25 tablet 3  . Omega-3 Fatty Acids (OMEGA-3 FISH OIL) 1200 MG CAPS 1,200 mg Two (2) times a day.    . pantoprazole (PROTONIX) 40 MG tablet TAKE 1 TABLET BY  MOUTH  DAILY 90 tablet 3  . PARoxetine (PAXIL) 20 MG tablet TAKE 1 TABLET BY MOUTH EVERY DAY IN THE MORNING 90 tablet 1  . valACYclovir (VALTREX) 1000 MG tablet TAKE 1 TABLET BY MOUTH 2  TIMES DAILY AS NEEDED FOR  VIRAL INFECTION. 180 tablet 0  . vitamin B-12 (CYANOCOBALAMIN) 500 MCG tablet Take 500 mcg by mouth daily.     No current facility-administered medications on file prior to visit.     Allergies  Allergen Reactions  . Codeine Nausea And Vomiting and Other (See Comments)    Severe stomach cramps  . Antihistamines, Diphenhydramine-Type Other (See Comments)    Causes hyperactivity  . Gabapentin     Urinary incontinence  . Statins Other (See Comments)    Joint pains  . Erythromycin Hives and Rash    Due to dental work about 50 years ago. Was used in packing and resulted in rash/hives inside and outside of mouth.    Past Medical History:  Diagnosis Date  . Allergy   . Anemia    childhood  . Anxiety   . Arterial stenosis (HCC)    mesenteric  . Arthritis   . Breast cancer ([place]) 12/05/10   R breast, inv mammary, in situ,ER/PR +,HER2 -  . Cancer ([place])   . Carcinoma of breast treated with adjuvant chemotherapy ([place])   . Coronary artery disease    stent 2007  . Depression   . Diabetes mellitus   . Difficulty sleeping   . Diverticulosis 12/10/03  . Elevated cholesterol   . Generalized weakness   . GERD (gastroesophageal reflux disease)   . Heart murmur    secondary to chem. 2013  . Hepatitis    as an infant  . HSV-1 (herpes simplex virus 1) infection   . Hyperlipidemia   . Hypertension   . Hypothyroidism   . Osteoarthritis   . Personal history of chemotherapy   . Personal history of radiation therapy   . Pneumonia    2014 september  . 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 Spinal stenosis   . Thyroid disease     Past Surgical History:  Procedure Laterality Date  . ANTERIOR AND POSTERIOR REPAIR N/A 04/05/2016   Procedure: ANTERIOR  (CYSTOCELE) AND POSTERIOR REPAIR (RECTOCELE);  Surgeon: [name], MD;  Location: [facility];  Service: Gynecology;  Laterality: N/A;  . BREAST BIOPSY    . BREAST LUMPECTOMY  1983   benign biopsy  . BREAST LUMPECTOMY Right 12/29/2010   snbx, ER/PR +, Her2 -, 0/1 node pos.  . CAROTID STENT  2007  . CARPAL TUNNEL RELEASE Bilateral 9/12,8,12  . CHOLECYSTECTOMY    . COLONOSCOPY N/A 02/09/2013   Procedure: COLONOSCOPY;  Surgeon: [name], MD;  Location: [facility];  Service: Endoscopy;  Laterality: N/A;  . COLONOSCOPY    . CORONARY ANGIOPLASTY    . DILATION AND CURETTAGE OF UTERUS    . ESOPHAGOGASTRODUODENOSCOPY N/A 02/09/2013   Procedure: ESOPHAGOGASTRODUODENOSCOPY (EGD);  Surgeon: [name], MD;  Location: [name];  Service: Endoscopy;  Laterality: N/A;  . FOOT SPURS    . HYSTEROSCOPY W/D&C N/A 09/11/2012   Procedure: DILATATION AND CURETTAGE ;  Surgeon: [name], MD;  Location: [facility];  Service: Gynecology;  Laterality: N/A;  . KNEE SURGERY  2007  . LAPAROSCOPIC ASSISTED VAGINAL HYSTERECTOMY N/A 04/05/2016   Procedure: LAPAROSCOPIC ASSISTED VAGINAL HYSTERECTOMY possible BSO;  Surgeon: [name], MD;  Location: [facility];  Service: Gynecology;  Laterality: N/A;  . LUMBAR LAMINECTOMY/DECOMPRESSION MICRODISCECTOMY N/A 10/20/2014   Procedure: MICRO LUMBER DECOMPRESSION L3-4 L4-5;  Surgeon: [name], MD;  Location: [facility];  Service: Orthopedics;  Laterality: N/A;  . LUMBAR LAMINECTOMY/DECOMPRESSION MICRODISCECTOMY Bilateral 10/13/2015   Procedure: MICRO LUMBAR DECOMPRESSION L5 - S1 AND REDO DECOMPRESSION L4 - L5 AND REMOVAL OF FACET CYST L4 - L5 2 LEVELS;  Surgeon: [name], MD;  Location: [facility];  Service: Orthopedics;  Laterality: Bilateral;  . PORT-A-CATH REMOVAL  04/17/2011   Procedure: REMOVAL PORT-A-CATH;  Surgeon: [name], MD;  Location: [facility];  Service: General;  Laterality: Left;  . PORTACATH PLACEMENT  02/07/2011   Procedure:  INSERTION PORT-A-CATH;  Surgeon: [name], MD;  Location: [facility];  Service: General;  Laterality: N/A;  . UPPER GASTROINTESTINAL ENDOSCOPY      Family History  Problem Relation Age of Onset  . Throat cancer Father   . Alcoholism Father   . Heart attack Father   . Heart disease Mother   . Lymphoma Brother 86  . Cancer Son   . Heart attack Brother   . Diabetes Brother   . Hypertension Brother   . Colon cancer Neg Hx   . Stroke Neg Hx   . Esophageal cancer Neg Hx   . Rectal cancer Neg Hx   . Stomach cancer Neg Hx     Social History   Socioeconomic History  . Marital status: Married    Spouse name: Not on file  . Number of children: 2  . Years of education: Not on file  . Highest education level: Not on file  Occupational History  . Occupation: retired  Scientific laboratory technician  . Financial resource strain: Not on file  . Food insecurity    Worry: Not on file    Inability: Not on file  . Transportation needs    Medical: Not on file    Non-medical: Not on file  Tobacco Use  . Smoking status: Former Smoker    Packs/day: 2.00    Years: 40.00    Pack years: 80.00    Quit date: 12/19/1997    Years since quitting: 20.6  . Smokeless tobacco: Never Used  Substance and Sexual Activity  . Alcohol use: Yes    Alcohol/week: 6.0 standard drinks    Types: 6 Glasses of wine per week    Comment: occasional  . Drug use: No  . Sexual activity: Not on file    Comment: menarch age 53, P1, HRT X 10 YRS, MENOPAUSE MID 40'S  Lifestyle  . Physical activity    Days  per week: Not on file    Minutes per session: Not on file  . Stress: Not on file  Relationships  . Social Herbalist on phone: Not on file    Gets together: Not on file    Attends religious service: Not on file    Active member of club or organization: Not on file    Attends meetings of clubs or organizations: Not on file    Relationship status: Not on file  . Intimate partner violence    Fear of current or  ex partner: Not on file    Emotionally abused: Not on file    Physically abused: Not on file    Forced sexual activity: Not on file  Other Topics Concern  . Not on file  Social History Narrative   [name] age 24- [place] age 83  [place]   The PMH, [place], Social History, Family History, Medications, and allergies have been reviewed in [facility], and have been updated if relevant.   Review of Systems  Constitutional: Negative.   HENT: Negative.   Respiratory: Negative.   Cardiovascular: Negative.   Gastrointestinal: Positive for abdominal pain.  Genitourinary: Positive for frequency and urgency. Negative for dysuria.  Musculoskeletal: Negative for back pain.  Skin: Positive for rash.  Allergic/Immunologic: Negative.   Neurological: Negative.   Hematological: Negative.   Psychiatric/Behavioral: Negative.   All other systems reviewed and are negative.      Objective:    BP 136/82 (BP Location: Left Arm, Patient Position: Sitting, Cuff Size: Normal)   Pulse 88   Temp 98.4 F (36.9 C) (Oral)   Ht '5\' 4"'  (1.626 m)   Wt 148 lb 12.8 oz (67.5 kg)   SpO2 96%   BMI 25.54 kg/m   Wt Readings from Last 3 Encounters:  07/21/18 148 lb 12.8 oz (67.5 kg)  06/24/18 145 lb (65.8 kg)  04/07/18 150 lb 12.8 oz (68.4 kg)    Physical Exam Vitals signs and nursing note reviewed.  Constitutional:      General: She is not in acute distress.    Appearance: She is not ill-appearing or toxic-appearing.  HENT:     Head: Normocephalic.     Right Ear: Tympanic membrane normal.     Left Ear: Tympanic membrane normal.     Nose: Nose normal.     Mouth/Throat:     Mouth: Mucous membranes are moist.  Eyes:     Extraocular Movements: Extraocular movements intact.  Neck:     Musculoskeletal: Normal range of motion.  Cardiovascular:     Rate and Rhythm: Normal rate and regular rhythm.     Pulses: Normal pulses.     Heart sounds: Normal heart sounds.  Pulmonary:     Effort: Pulmonary  effort is normal.     Breath sounds: Normal breath sounds.  Abdominal:     General: There is no distension.     Palpations: There is no mass.     Tenderness: There is abdominal tenderness in the suprapubic area. There is no right CVA tenderness, left CVA tenderness, guarding or rebound.     Hernia: No hernia is present.  Musculoskeletal: Normal range of motion.        General: No swelling or tenderness.  Skin:    Findings: Rash and wound present.       Neurological:     General: No focal deficit present.     Mental Status: She is alert and oriented  to person, place, and time.  Psychiatric:        Mood and Affect: Mood normal.        Behavior: Behavior normal.        Thought Content: Thought content normal.        Judgment: Judgment normal.           Assessment & Plan:   Type 2 diabetes, uncontrolled, with renal manifestation (HCC) - Plan: HgB A1c, Comp Met (CMET), Lipid panel, *  Dysuria - Plan: POCT urinalysis dipstick, Urine Culture,   Type 2 diabetes mellitus with diabetic neuropathy, without long-term current use of insulin (HCC) - Plan:   Essential hypertension - Plan:  Nonvenomous bug bite of shoulder or upper arm, infected, left, initial encounter - Plan:   HYPERCHOLESTEROLEMIA - Plan:  Acute cystitis without hematuria - Plan:  No follow-ups on file.

## 2018-07-21 NOTE — Patient Instructions (Addendum)
Great to see you. I will call you with your lab results from today and you can view them online.   Use clobetasol cream twice daily on your left arm but no longer than 2 weeks without calling me.  Take cipro as directed- 1 tablet twice daily for 5 days.  Please keep [place] updated and drink a lot of water.

## 2018-07-21 NOTE — Assessment & Plan Note (Signed)
Continue current rx. Check labs today. 

## 2018-07-21 NOTE — Assessment & Plan Note (Signed)
UA neg but does have classic symptoms of UTI- will treat with cipro 250 mg twice daily x 5 days as pt has grown resistant trains to cephalosporins in the past. Discussed black box warning- including tendon rupture.  She is aware and would still like to take cipro.  Urine cx sent.   The patient indicates understanding of these issues and agrees with the plan.

## 2018-07-21 NOTE — Assessment & Plan Note (Addendum)
Does not appear to be infected at this time. Will treat with clobetasol twice daily for no more than 1-2 weeks without updating me. Advised to stop taking topical benadryl. Call or send my chart message prn if these symptoms worsen or fail to improve as anticipated.

## 2018-07-22 ENCOUNTER — Ambulatory Visit: Payer: Medicare Other | Admitting: Family Medicine

## 2018-07-23 LAB — URINE CULTURE
MICRO NUMBER:: 616962
SPECIMEN QUALITY:: ADEQUATE

## 2018-07-26 ENCOUNTER — Other Ambulatory Visit: Payer: Self-pay | Admitting: Family Medicine

## 2018-08-01 ENCOUNTER — Other Ambulatory Visit: Payer: Self-pay | Admitting: Family Medicine

## 2018-08-01 MED ORDER — TRULICITY 0.75 MG/0.5ML ~~LOC~~ SOAJ: 0.7500 mg | SUBCUTANEOUS | 3 refills | Status: DC

## 2018-08-10 ENCOUNTER — Other Ambulatory Visit: Payer: Self-pay | Admitting: Family Medicine

## 2018-08-18 ENCOUNTER — Other Ambulatory Visit: Payer: Self-pay | Admitting: Cardiovascular Disease

## 2018-08-19 LAB — HM DIABETES EYE EXAM

## 2018-08-26 ENCOUNTER — Other Ambulatory Visit: Payer: Self-pay | Admitting: Cardiovascular Disease

## 2018-08-26 ENCOUNTER — Telehealth: Payer: Self-pay | Admitting: Cardiovascular Disease

## 2018-08-26 DIAGNOSIS — I10 Essential (primary) hypertension: Secondary | ICD-10-CM

## 2018-08-26 MED ORDER — CHLORTHALIDONE 25 MG PO TABS: ORAL_TABLET | ORAL | 0 refills | Status: DC

## 2018-08-26 NOTE — Telephone Encounter (Signed)
Called and spoke to patient. She states that her BP has been elevated for about a week or so. BP readings are as follows: 208/105, 178/116, 196/101. Patient states that she has been having headaches. Denies chest pain, dizziness, lightheadedness, syncope, or changes in her vision, speech, or strength. Patient takes losartan 50 mg every morning. She states that she has been taking an extra 50 mg prn to see if it will help and it has not. Confirmed patient has been checking BP correctly (seated rested with feet flat on the floor, 1-2 hours after meds, not immediatly following a meal, etc.) and she has been. Patient rechecked BP while on the phone and it was 197/88. Patient denies excessive salt intake. Patient's last Cr- 1.46 and K-5.4 on 6/29.   Discussed with Jinny Blossom, PharmD and she recommended patient start chlorthalidone 25 mg tablet. Patient to take 1/2 tablet (12.5 mg) once a day x 3 days and then increase to full tablet (25 mg) once a day. Patient will need BMET in 2 weeks.  Called and instructed patient to start chlorthalidone 25 mg tablet taking 1/2 tablet (12.5 mg) once a day x 3 days and then increase to full tablet (25 mg) once a day. Rx sent to preferred pharmacy. BMET ordered and scheduled for 8/18. Instructed the patient to continue to avoid salt, continue to monitor BP, and let us know if her Sx change or worsen. Patient verbalized understanding and thanked me for the call. Will forward to Dr. Johnsie Cancel to make aware.

## 2018-08-26 NOTE — Telephone Encounter (Signed)
Addressed see phone note from today

## 2018-08-26 NOTE — Telephone Encounter (Signed)
Patient called back stating she has to step away, she stated you can leave a VM letting her know if she has to start the new medication today even through she has already taken the losartan (COZAAR) 50 MG tablet for today.

## 2018-08-26 NOTE — Telephone Encounter (Signed)
New Message       Pt c/o BP issue: STAT if pt c/o blurred vision, one-sided weakness or slurred speech  1. What are your last 5 BP readings? This morning 178/116, Middle of the night 208/105   2. Are you having any other symptoms (ex. Dizziness, headache, blurred vision, passed out)? Headache   3. What is your BP issue?  Pt has been having a headache and she said she checked her BP  In the middle of the night and it was 208/105 and this morning 178/116

## 2018-08-26 NOTE — Telephone Encounter (Signed)
Follow up:   Patient calling back to ask a question. Please call patient back.

## 2018-08-26 NOTE — Telephone Encounter (Signed)
Yes start diuretic with cozaar

## 2018-08-26 NOTE — Telephone Encounter (Signed)
Left detailed message on VM instructing patient to take both losartan and chlorthalidone. Instructed patient to call back with any questions or concerns.

## 2018-09-09 ENCOUNTER — Other Ambulatory Visit: Payer: Medicare Other

## 2018-09-09 ENCOUNTER — Other Ambulatory Visit: Payer: Self-pay

## 2018-09-09 DIAGNOSIS — I10 Essential (primary) hypertension: Secondary | ICD-10-CM

## 2018-09-09 LAB — BASIC METABOLIC PANEL
BUN/Creatinine Ratio: 18 (ref 12–28)
BUN: 32 mg/dL — ABNORMAL HIGH (ref 8–27)
CO2: 22 mmol/L (ref 20–29)
Calcium: 10.6 mg/dL — ABNORMAL HIGH (ref 8.7–10.3)
Chloride: 95 mmol/L — ABNORMAL LOW (ref 96–106)
Creatinine, Ser: 1.74 mg/dL — ABNORMAL HIGH (ref 0.57–1.00)
GFR calc Af Amer: 33 mL/min/{1.73_m2} — ABNORMAL LOW (ref >59)
GFR calc non Af Amer: 28 mL/min/{1.73_m2} — ABNORMAL LOW (ref >59)
Glucose: 218 mg/dL — ABNORMAL HIGH (ref 65–99)
Potassium: 4.8 mmol/L (ref 3.5–5.2)
Sodium: 136 mmol/L (ref 134–144)

## 2018-09-18 ENCOUNTER — Ambulatory Visit: Payer: Medicare Other | Admitting: Gastroenterology

## 2018-09-25 ENCOUNTER — Telehealth: Payer: Self-pay | Admitting: Family Medicine

## 2018-09-25 ENCOUNTER — Other Ambulatory Visit: Payer: Self-pay

## 2018-09-25 DIAGNOSIS — R300 Dysuria: Secondary | ICD-10-CM

## 2018-09-25 MED ORDER — CIPROFLOXACIN HCL 250 MG PO TABS: 250.0000 mg | ORAL_TABLET | Freq: Two times a day (BID) | ORAL | 0 refills | Status: AC

## 2018-09-25 NOTE — Telephone Encounter (Signed)
Sent in per TA/LMOVM that this has been completed and to inform if not progressing as we may need to have her come in at that point/thx dmf

## 2018-09-25 NOTE — Telephone Encounter (Signed)
Yes absolutely! Thank you! 

## 2018-09-25 NOTE — Telephone Encounter (Signed)
Pt stated she is having burning with urination and frequent urination x1 day and believes she has a UTI. Would like to request Dr. Deborra Medina send in rx to pharmacy. Please advise.  [id number] [address], [place] [phone] (Phone) [phone] (Fax)

## 2018-09-25 NOTE — Telephone Encounter (Signed)
TA-May I send in the last UTI Tx for pt and if ineffective will have to come in? Plz advise/thx dmf

## 2018-09-26 ENCOUNTER — Other Ambulatory Visit: Payer: Self-pay

## 2018-09-26 MED ORDER — FLUCONAZOLE 150 MG PO TABS: 150.0000 mg | ORAL_TABLET | Freq: Once | ORAL | 0 refills | Status: AC

## 2018-09-26 NOTE — Telephone Encounter (Signed)
I discussed this call with the young lady [name] at the [facility]/she was very understanding and humble/I told her that the patient is ok and calm and taken care of/thx dmf

## 2018-09-26 NOTE — Telephone Encounter (Addendum)
Pt called and asked me to read the message that was sent in for her.  Pt states she said NOTHING about a possible UTI.  She states she has a yeast infection. Pt states she said NOTHING about frequent urination  Pt states she she even said she needs that "one little pill" and now she got a weeks worth of UTI med.   Pt state she is very upset that she has to go another day like this. Requesting diflucan.

## 2018-09-26 NOTE — Telephone Encounter (Signed)
I discussed this with the patient and assured her that I would take care of this/TA gave me permission to send in Diflucan as it was well tolerated in the past/thx dmf

## 2018-10-16 ENCOUNTER — Ambulatory Visit: Payer: Self-pay | Admitting: *Deleted

## 2018-10-16 NOTE — Telephone Encounter (Signed)
Seeing black marks in the left eye only and only at the outer aspect of her vision. This began about one month ago and says it is constant any time she stands and walks. Does not see the marks while sitting.No pain in the eye except for eye lid felt irritated on the inner part for several mornings on/off, goes away as the day goes on.Reports she had a tear on her cornea back in June, could not be for sure which eye but saw ophthalmology. Marland Kitchen Has not consulted with eye doctor for this problem. Denies blurry/double vision/dizziness/weakness. Has a mild dull headache today. B/P 159/76, HR 81 bpm. Believes this may have started after a round of prednisone 4 weeks ago. Advised to consult with eye physician and to call back if needed or if they recommended primary care visit would be best. Stated she would call now.   Answer Assessment - Initial Assessment Questions 1. DESCRIPTION: "What is the vision loss like? Describe it for me." (e.g., complete vision loss, blurred vision, double vision, floaters, etc.)     Sees black checks in the left outer aspect of vision only when she stands.  2. LOCATION: "One or both eyes?" If one, ask: "Which eye?"     Left only 3. SEVERITY: "Can you see anything?" If so, ask: "What can you see?" (e.g., fine print)     Black checks 4. ONSET: "When did this begin?" "Did it start suddenly or has this been gradual?"     About one month ago 5. PATTERN: "Does this come and go, or has it been constant since it started?"     constant 6. PAIN: "Is there any pain in your eye(s)?"  (Scale 1-10; or mild, moderate, severe)     No 7. CONTACTS-GLASSES: "Do you wear contacts or glasses?"     No, just readers 8. CAUSE: "What do you think is causing this visual problem?"     Unsure  9. OTHER SYMPTOMS: "Do you have any other symptoms?" (e.g., confusion, headache, arm or leg weakness, speech problems)     Dull headache on the left side of the head.  10. PREGNANCY: "Is there any chance you  are pregnant?" "When was your last menstrual period?"       na  Protocols used: [place]

## 2018-10-20 ENCOUNTER — Other Ambulatory Visit: Payer: Self-pay | Admitting: Family Medicine

## 2018-10-21 ENCOUNTER — Ambulatory Visit: Payer: Medicare Other | Admitting: Family Medicine

## 2018-10-31 DIAGNOSIS — I739 Peripheral vascular disease, unspecified: Secondary | ICD-10-CM | POA: Insufficient documentation

## 2018-10-31 DIAGNOSIS — E278 Other specified disorders of adrenal gland: Secondary | ICD-10-CM | POA: Insufficient documentation

## 2018-10-31 NOTE — Assessment & Plan Note (Signed)
Followed by renal. On ARB.

## 2018-10-31 NOTE — Assessment & Plan Note (Addendum)
Improved control with less GI upset with the addition of trulicity.  Repeat a1c today. On ARB, unfortunately has refused statin therapy. The patient indicates understanding of these issues and agrees with the plan. Orders Placed This Encounter  Procedures  . Hemoglobin A1c  . Vitamin D (25 hydroxy)  . Comprehensive metabolic panel  . Lipid panel  . TSH  . T4, free  . CBC with Differential/Platelet  . Ferritin  . B12

## 2018-10-31 NOTE — Assessment & Plan Note (Addendum)
With his 10 year ascvd risk score. Has refused statins in past due to myalgias.  She is taking Welchol.  Even after counseling, she is refusing statin. The 10-year ASCVD risk score Mikey Bussing DC Brooke Bonito., et al., 2013) is: 40.4%   Values used to calculate the score:     Age: 78 years     Sex: Female     Is Non-Hispanic African American: No     Diabetic: Yes     Tobacco smoker: No     Systolic Blood Pressure: 195 mmHg     Is BP treated: Yes     HDL Cholesterol: 36.1 mg/dL     Total Cholesterol: 214 mg/dL

## 2018-10-31 NOTE — Assessment & Plan Note (Signed)
Check Vit D today given CKD.

## 2018-10-31 NOTE — Patient Instructions (Signed)
Great to see you, [name]! I will call you with your lab results from today and you can view them online.   Happy birthday!

## 2018-10-31 NOTE — Progress Notes (Signed)
Subjective:   Patient ID: [name], female    DOB: [birth year], 78 y.o.   MRN: [id number]  [name] is a pleasant 77 y.o. year old female who presents to clinic today with Follow-up (Patient is here today to F/U with DM. On 6.29.20 she agreed to add Trulicity  due to her M7B increasing to 9.1.  She declines flu shot today plans to get from pharmacy. She is currently fasting. She states that her BS may be effected by a course of steroids and an epidural injection she had for her back.)  on 11/03/2018  HPI:  Last saw pt on 07/21/18- note reviewed.  DM with renal manifestations-  On 4.30.20 her A1C was 10.0 and 31-month-prior to that was 8.4. Due to the increase in A1C her Metformin was increased to 5057mtwice daily but she went back down to 1 tablet daily due to GI side effects. She was also taking Januvia 100 mg daily and glucotrol XL 10 mg daily.  Her hemoglobin a1c increased to 9.1 to on 07/21/18.  She agreed to start Trulicity and has been taking it as directed- 0.75 mg weekly.  Checks FSBS three times weekly- running 120s0-130s.  She had steroid and epidural injections about a month ago.  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 Was last seen by Dr. UpHollie Salkn 09/17/18- note reviewed.  Lab Results  Component Value Date   CREATININE 1.74 (H) 09/09/2018   BUN 32 (H) 09/09/2018   NA 136 09/09/2018   K 4.8 09/09/2018   CL 95 (L) 09/09/2018   CO2 22 09/09/2018    HLD- with high ascvd 10 year risk but she is intolerant to statin.  She has been taking welchol. Lab Results  Component Value Date   CHOL 214 (H) 07/21/2018   HDL 36.10 (L) 07/21/2018   LDLCALC 83 05/13/2008   LDLDIRECT 137.0 07/21/2018   TRIG 265.0 (H) 07/21/2018   CHOLHDL 6 07/21/2018   The 10-year ASCVD risk score (GMikey BussingC Jr., et al., 2013) is: 42.2%   Values used to calculate the score:     Age: [age]     Sex: Female     Is Non-Hispanic  African American: No     Diabetic: Yes     Tobacco smoker: No     Systolic Blood Pressure: 14403mHg     Is BP treated: Yes     HDL Cholesterol: 36.1 mg/dL     Total Cholesterol: 214 mg/dL  Hypothyroidism- compliant with synthroid 75 mcg daily.  Lab Results  Component Value Date   TSH 3.12 12/16/2017   HTN- well controlled on Cozaar 50 mg daily and hygroton 25 mg daily.  Also sees Dr. NiJohnsie Cancel Last saw him in 06/2018.  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  She has been taking iron as directed.  Denies blood in her stool.  Lab Results  Component Value Date   WBC 12.1 (H) 04/07/2018   HGB 13.2 04/07/2018   HCT 39.7 04/07/2018   MCV 86.7 04/07/2018   PLT 275.0 04/07/2018   Iron/TIBC/Ferritin/ %Sat    Component Value Date/Time   IRON 84 02/20/2018   TIBC 388 02/20/2018  FERRITIN 39 02/20/2018   IRONPCTSAT 22 02/20/2018   IRONPCTSAT 8 (L) 11/12/2017 1250    Lab Results  Component Value Date   VD25OH 50.83 09/10/2017   VD25OH 29.45 (L) 06/03/2017   VD25OH 53 08/20/2011     Current Outpatient Medications on File Prior to Visit  Medication Sig Dispense Refill  . ACCU-CHEK GUIDE test strip TEST ONCE DAILY 100 each 5  . aspirin 81 MG tablet Take 1 tablet (81 mg total) by mouth daily. Resume 4 days post-op    . chlorthalidone (HYGROTON) 25 MG tablet TAKE 1/2 TABLET(12.5 MG) BY MOUTH EVERY DAY FOR 3 DAYS THEN TAKE 1 TABLET(25 MG) BY MOUTH EVERY DAY 90 tablet 0  . Cholecalciferol (VITAMIN D) 2000 units CAPS Take 1 capsule by mouth daily.    . clobetasol cream (TEMOVATE) 7.09 % Apply 1 application topically 2 (two) times daily. 30 g 0  . colesevelam (WELCHOL) 625 MG tablet Take 4 tablets (2,500 mg total) by mouth daily. (Patient taking differently: Take 1,250 mg  by mouth daily. ) 360 tablet 3  . Dulaglutide (TRULICITY) 6.43 CV/8.1MM SOPN Inject 0.75 mg into the skin once a week. 1 pen 3  . ferrous sulfate (FERROUSUL) 325 (65 FE) MG tablet Take 1 tablet (325 mg total) by mouth daily with breakfast. 30 tablet 3  . glipiZIDE (GLUCOTROL XL) 10 MG 24 hr tablet Take 1 tablet (10 mg total) by mouth daily with breakfast. 90 tablet 3  . JANUVIA 100 MG tablet TAKE 1 TABLET BY MOUTH  DAILY 90 tablet 1  . Lancet Device MISC 100 each by Does not apply route daily. UAD for daily glucose monitoring 100 each PRN  . Lancets Misc. (ACCU-CHEK MULTICLIX LANCET DEV) KIT Use as directed to check sugars    . levothyroxine (SYNTHROID, LEVOTHROID) 75 MCG tablet TAKE 1 TABLET BY MOUTH ONCE DAILY AT 6 AM 90 tablet 2  . losartan (COZAAR) 50 MG tablet Take 1 tablet (50 mg total) by mouth daily. 90 tablet 3  . Magnesium Cl-Calcium Carbonate (SLOW MAGNESIUM/CALCIUM PO) Take 1 tablet by mouth daily.    . metFORMIN (GLUCOPHAGE) 500 MG tablet Take 1 tablet (500 mg total) by mouth 2 (two) times daily with a meal. (Patient taking differently: Take 500 mg by mouth daily with breakfast. ) 180 tablet 3  . nitroGLYCERIN (NITROSTAT) 0.4 MG SL tablet Place 1 tablet (0.4 mg total) under the tongue every 5 (five) minutes as needed for chest pain. 25 tablet 3  . Omega-3 Fatty Acids (OMEGA-3 FISH OIL) 1200 MG CAPS 1,200 mg Two (2) times a day.    . pantoprazole (PROTONIX) 40 MG tablet TAKE 1 TABLET BY MOUTH  DAILY 90 tablet 1  . PARoxetine (PAXIL) 20 MG tablet TAKE 1 TABLET BY MOUTH EVERY DAY IN THE MORNING 90 tablet 1  . valACYclovir (VALTREX) 1000 MG tablet TAKE 1 TABLET BY MOUTH 2  TIMES DAILY AS NEEDED FOR  VIRAL INFECTION. 180 tablet 0  . vitamin B-12 (CYANOCOBALAMIN) 500 MCG tablet Take 500 mcg by mouth daily.     No current facility-administered medications on file prior to visit.     Allergies  Allergen Reactions  . Codeine Nausea And Vomiting and Other (See Comments)    Severe stomach  cramps  . Antihistamines, Diphenhydramine-Type Other (See Comments)    Causes hyperactivity  . Gabapentin     Urinary incontinence  . Statins Other (See Comments)    Joint pains  . Erythromycin Hives and Rash  Due to dental work about 50 years ago. Was used in packing and resulted in rash/hives inside and outside of mouth.    Past Medical History:  Diagnosis Date  . Allergy   . Anemia    childhood  . Anxiety   . Arterial stenosis (HCC)    mesenteric  . Arthritis   . Breast cancer ([facility]) 12/05/10   R breast, inv mammary, in situ,ER/PR +,HER2 -  . Cancer ([place])   . Carcinoma of breast treated with adjuvant chemotherapy ([place])   . Coronary artery disease    stent 2007  . Depression   . Diabetes mellitus   . Difficulty sleeping   . Diverticulosis 12/10/03  . Elevated cholesterol   . Generalized weakness   . GERD (gastroesophageal reflux disease)   . Heart murmur    secondary to chem. 2013  . Hepatitis    as an infant  . HSV-1 (herpes simplex virus 1) infection   . Hyperlipidemia   . Hypertension   . Hypothyroidism   . Osteoarthritis   . Personal history of chemotherapy   . Personal history of radiation therapy   . Pneumonia    2014 september  . 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 Spinal stenosis   . Thyroid disease     Past Surgical History:  Procedure Laterality Date  . ANTERIOR AND POSTERIOR REPAIR N/A 04/05/2016   Procedure: ANTERIOR (CYSTOCELE) AND POSTERIOR REPAIR (RECTOCELE);  Surgeon: [name], MD;  Location: [facility];  Service: Gynecology;  Laterality: N/A;  . BREAST BIOPSY    . BREAST LUMPECTOMY  1983   benign biopsy  . BREAST LUMPECTOMY Right 12/29/2010   snbx, ER/PR +, Her2 -, 0/1 node pos.  . CAROTID STENT  2007  . CARPAL TUNNEL RELEASE Bilateral 9/12,8,12  . CHOLECYSTECTOMY    . COLONOSCOPY N/A 02/09/2013   Procedure: COLONOSCOPY;  Surgeon: [name], MD;  Location: [facility];  Service: Endoscopy;  Laterality: N/A;   . COLONOSCOPY    . CORONARY ANGIOPLASTY    . DILATION AND CURETTAGE OF UTERUS    . ESOPHAGOGASTRODUODENOSCOPY N/A 02/09/2013   Procedure: ESOPHAGOGASTRODUODENOSCOPY (EGD);  Surgeon: [name], MD;  Location: [name];  Service: Endoscopy;  Laterality: N/A;  . FOOT SPURS    . HYSTEROSCOPY W/D&C N/A 09/11/2012   Procedure: DILATATION AND CURETTAGE ;  Surgeon: [name], MD;  Location: [facility];  Service: Gynecology;  Laterality: N/A;  . KNEE SURGERY  2007  . LAPAROSCOPIC ASSISTED VAGINAL HYSTERECTOMY N/A 04/05/2016   Procedure: LAPAROSCOPIC ASSISTED VAGINAL HYSTERECTOMY possible BSO;  Surgeon: [name], MD;  Location: [facility];  Service: Gynecology;  Laterality: N/A;  . LUMBAR LAMINECTOMY/DECOMPRESSION MICRODISCECTOMY N/A 10/20/2014   Procedure: MICRO LUMBER DECOMPRESSION L3-4 L4-5;  Surgeon: [name], MD;  Location: [facility];  Service: Orthopedics;  Laterality: N/A;  . LUMBAR LAMINECTOMY/DECOMPRESSION MICRODISCECTOMY Bilateral 10/13/2015   Procedure: MICRO LUMBAR DECOMPRESSION L5 - S1 AND REDO DECOMPRESSION L4 - L5 AND REMOVAL OF FACET CYST L4 - L5 2 LEVELS;  Surgeon: [name], MD;  Location: [facility];  Service: Orthopedics;  Laterality: Bilateral;  . PORT-A-CATH REMOVAL  04/17/2011   Procedure: REMOVAL PORT-A-CATH;  Surgeon: [name], MD;  Location: [facility];  Service: General;  Laterality: Left;  . PORTACATH PLACEMENT  02/07/2011   Procedure: INSERTION PORT-A-CATH;  Surgeon: [name], MD;  Location: [facility];  Service: General;  Laterality: N/A;  . UPPER GASTROINTESTINAL ENDOSCOPY  Family History  Problem Relation Age of Onset  . Throat cancer Father   . Alcoholism Father   . Heart attack Father   . Heart disease Mother   . Lymphoma Brother 13  . Cancer Son   . Heart attack Brother   . Diabetes Brother   . Hypertension Brother   . Colon cancer Neg Hx   . Stroke Neg Hx   . Esophageal cancer Neg Hx   . Rectal cancer Neg Hx   .  Stomach cancer Neg Hx     Social History   Socioeconomic History  . Marital status: Married    Spouse name: Not on file  . Number of children: 2  . Years of education: Not on file  . Highest education level: Not on file  Occupational History  . Occupation: retired  Scientific laboratory technician  . Financial resource strain: Not on file  . Food insecurity    Worry: Not on file    Inability: Not on file  . Transportation needs    Medical: Not on file    Non-medical: Not on file  Tobacco Use  . Smoking status: Former Smoker    Packs/day: 2.00    Years: 40.00    Pack years: 80.00    Quit date: 12/19/1997    Years since quitting: 20.8  . Smokeless tobacco: Never Used  Substance and Sexual Activity  . Alcohol use: Yes    Alcohol/week: 6.0 standard drinks    Types: 6 Glasses of wine per week    Comment: occasional  . Drug use: No  . Sexual activity: Not on file    Comment: menarch age 41, P35, HRT X 10 YRS, MENOPAUSE MID 40'S  Lifestyle  . Physical activity    Days per week: Not on file    Minutes per session: Not on file  . Stress: Not on file  Relationships  . Social Herbalist on phone: Not on file    Gets together: Not on file    Attends religious service: Not on file    Active member of club or organization: Not on file    Attends meetings of clubs or organizations: Not on file    Relationship status: Not on file  . Intimate partner violence    Fear of current or ex partner: Not on file    Emotionally abused: Not on file    Physically abused: Not on file    Forced sexual activity: Not on file  Other Topics Concern  . Not on file  Social History Narrative   [name] age 45- [place] age 89  [place]   The PMH, [place], Social History, Family History, Medications, and allergies have been reviewed in [facility], and have been updated if relevant.   Review of Systems  Constitutional: Negative.   HENT: Negative.   Eyes: Negative.   Respiratory: Negative.    Cardiovascular: Negative.  Negative for chest pain.  Gastrointestinal: Negative.   Endocrine: Negative.   Genitourinary: Negative.   Musculoskeletal: Negative.   Skin: Negative.   Allergic/Immunologic: Negative.   Neurological: Negative.   Hematological: Negative.  Negative for adenopathy.  Psychiatric/Behavioral: Negative.   All other systems reviewed and are negative.      Objective:    BP 140/78 (BP Location: Left Arm, Patient Position: Sitting, Cuff Size: Normal)   Pulse 86   Temp 98.7 F (37.1 C) (Oral)   Wt 148 lb 3.2 oz (67.2 kg)   SpO2  97%   BMI 25.44 kg/m   Wt Readings from Last 3 Encounters:  11/03/18 148 lb 3.2 oz (67.2 kg)  07/21/18 148 lb 12.8 oz (67.5 kg)  06/24/18 145 lb (65.8 kg)    Physical Exam   General:  Well-developed,well-nourished,in no acute distress; alert,appropriate and cooperative throughout examination Head:  normocephalic and atraumatic.   Eyes:  vision grossly intact, PERRL Ears:  R ear normal and L ear normal externally, TMs clear bilaterally Nose:  no external deformity.   Mouth:  good dentition.   Neck:  No deformities, masses, or tenderness noted.  Lungs:  Normal respiratory effort, chest expands symmetrically. Lungs are clear to auscultation, no crackles or wheezes. Heart:  Normal rate and regular rhythm. S1 and S2 normal without gallop, murmur, click, rub or other extra sounds. Abdomen:  Bowel sounds positive,abdomen soft and non-tender without masses, organomegaly or hernias noted. Extremities:  No clubbing, cyanosis, edema, or deformity noted with normal full range of motion of all joints.   Neurologic:  alert & oriented X3 and gait normal.   Skin:  Intact without suspicious lesions or rashes Cervical Nodes:  No lymphadenopathy noted Axillary Nodes:  No palpable lymphadenopathy Psych:  Cognition and judgment appear intact. Alert and cooperative with normal attention span and concentration. 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

## 2018-10-31 NOTE — Assessment & Plan Note (Signed)
Well controlled on current rxs. I am concerned about the effect the diuretic is having on her kidney function.  Check renal function labs today.

## 2018-10-31 NOTE — Assessment & Plan Note (Signed)
Clinically euthyroid. Due for labs today.

## 2018-11-03 ENCOUNTER — Encounter: Payer: Self-pay | Admitting: Family Medicine

## 2018-11-03 ENCOUNTER — Other Ambulatory Visit: Payer: Self-pay

## 2018-11-03 ENCOUNTER — Ambulatory Visit (INDEPENDENT_AMBULATORY_CARE_PROVIDER_SITE_OTHER): Payer: Medicare Other | Admitting: Family Medicine

## 2018-11-03 VITALS — BP 140/78 | HR 86 | Temp 98.7°F | Wt 148.2 lb

## 2018-11-03 DIAGNOSIS — E559 Vitamin D deficiency, unspecified: Secondary | ICD-10-CM | POA: Diagnosis not present

## 2018-11-03 DIAGNOSIS — IMO0002 Reserved for concepts with insufficient information to code with codable children: Secondary | ICD-10-CM

## 2018-11-03 DIAGNOSIS — E1129 Type 2 diabetes mellitus with other diabetic kidney complication: Secondary | ICD-10-CM | POA: Diagnosis not present

## 2018-11-03 DIAGNOSIS — C50511 Malignant neoplasm of lower-outer quadrant of right female breast: Secondary | ICD-10-CM

## 2018-11-03 DIAGNOSIS — E1165 Type 2 diabetes mellitus with hyperglycemia: Secondary | ICD-10-CM

## 2018-11-03 DIAGNOSIS — E278 Other specified disorders of adrenal gland: Secondary | ICD-10-CM

## 2018-11-03 DIAGNOSIS — D508 Other iron deficiency anemias: Secondary | ICD-10-CM

## 2018-11-03 DIAGNOSIS — I2510 Atherosclerotic heart disease of native coronary artery without angina pectoris: Secondary | ICD-10-CM

## 2018-11-03 DIAGNOSIS — N184 Chronic kidney disease, stage 4 (severe): Secondary | ICD-10-CM

## 2018-11-03 DIAGNOSIS — E039 Hypothyroidism, unspecified: Secondary | ICD-10-CM

## 2018-11-03 DIAGNOSIS — Z9861 Coronary angioplasty status: Secondary | ICD-10-CM

## 2018-11-03 DIAGNOSIS — I10 Essential (primary) hypertension: Secondary | ICD-10-CM | POA: Diagnosis not present

## 2018-11-03 DIAGNOSIS — E538 Deficiency of other specified B group vitamins: Secondary | ICD-10-CM

## 2018-11-03 DIAGNOSIS — R300 Dysuria: Secondary | ICD-10-CM

## 2018-11-03 DIAGNOSIS — E7800 Pure hypercholesterolemia, unspecified: Secondary | ICD-10-CM | POA: Diagnosis not present

## 2018-11-03 DIAGNOSIS — I739 Peripheral vascular disease, unspecified: Secondary | ICD-10-CM

## 2018-11-03 DIAGNOSIS — Z170 Estrogen receptor positive status [ER+]: Secondary | ICD-10-CM

## 2018-11-03 DIAGNOSIS — E1122 Type 2 diabetes mellitus with diabetic chronic kidney disease: Secondary | ICD-10-CM

## 2018-11-03 LAB — COMPREHENSIVE METABOLIC PANEL
ALT: 16 U/L (ref 0–35)
AST: 16 U/L (ref 0–37)
Albumin: 4 g/dL (ref 3.5–5.2)
Alkaline Phosphatase: 77 U/L (ref 39–117)
BUN: 24 mg/dL — ABNORMAL HIGH (ref 6–23)
CO2: 28 mEq/L (ref 19–32)
Calcium: 9.7 mg/dL (ref 8.4–10.5)
Chloride: 98 mEq/L (ref 96–112)
Creatinine, Ser: 1.41 mg/dL — ABNORMAL HIGH (ref 0.40–1.20)
GFR: 36.26 mL/min — ABNORMAL LOW (ref >60.00)
Glucose, Bld: 198 mg/dL — ABNORMAL HIGH (ref 70–99)
Potassium: 4.8 mEq/L (ref 3.5–5.1)
Sodium: 136 mEq/L (ref 135–145)
Total Bilirubin: 0.5 mg/dL (ref 0.2–1.2)
Total Protein: 6.8 g/dL (ref 6.0–8.3)

## 2018-11-03 LAB — CBC WITH DIFFERENTIAL/PLATELET
Basophils Absolute: 0.1 10*3/uL (ref 0.0–0.1)
Basophils Relative: 0.5 % (ref 0.0–3.0)
Eosinophils Absolute: 0.3 10*3/uL (ref 0.0–0.7)
Eosinophils Relative: 2.5 % (ref 0.0–5.0)
HCT: 37.3 % (ref 36.0–46.0)
Hemoglobin: 12.7 g/dL (ref 12.0–15.0)
Lymphocytes Relative: 24.4 % (ref 12.0–46.0)
Lymphs Abs: 2.5 10*3/uL (ref 0.7–4.0)
MCHC: 34 g/dL (ref 30.0–36.0)
MCV: 91.3 fl (ref 78.0–100.0)
Monocytes Absolute: 0.9 10*3/uL (ref 0.1–1.0)
Monocytes Relative: 9.4 % (ref 3.0–12.0)
Neutro Abs: 6.4 10*3/uL (ref 1.4–7.7)
Neutrophils Relative %: 63.2 % (ref 43.0–77.0)
Platelets: 279 10*3/uL (ref 150.0–400.0)
RBC: 4.08 Mil/uL (ref 3.87–5.11)
RDW: 13.9 % (ref 11.5–15.5)
WBC: 10.1 10*3/uL (ref 4.0–10.5)

## 2018-11-03 LAB — LIPID PANEL
Cholesterol: 190 mg/dL (ref 0–200)
HDL: 36 mg/dL — ABNORMAL LOW (ref >39.00)
NonHDL: 154.1
Total CHOL/HDL Ratio: 5
Triglycerides: 238 mg/dL — ABNORMAL HIGH (ref 0.0–149.0)
VLDL: 47.6 mg/dL — ABNORMAL HIGH (ref 0.0–40.0)

## 2018-11-03 LAB — T4, FREE: Free T4: 0.92 ng/dL (ref 0.60–1.60)

## 2018-11-03 LAB — FERRITIN: Ferritin: 63.8 ng/mL (ref 10.0–291.0)

## 2018-11-03 LAB — HEMOGLOBIN A1C: Hgb A1c MFr Bld: 8.2 % — ABNORMAL HIGH (ref 4.6–6.5)

## 2018-11-03 LAB — TSH: TSH: 5.39 u[IU]/mL — ABNORMAL HIGH (ref 0.35–4.50)

## 2018-11-03 LAB — VITAMIN D 25 HYDROXY (VIT D DEFICIENCY, FRACTURES): VITD: 55.05 ng/mL (ref 30.00–100.00)

## 2018-11-03 LAB — VITAMIN B12: Vitamin B-12: 855 pg/mL (ref 211–911)

## 2018-11-03 LAB — LDL CHOLESTEROL, DIRECT: Direct LDL: 125 mg/dL

## 2018-11-03 NOTE — Assessment & Plan Note (Signed)
Check CBC and ferritin 

## 2018-11-17 DIAGNOSIS — M169 Osteoarthritis of hip, unspecified: Secondary | ICD-10-CM | POA: Insufficient documentation

## 2018-11-17 DIAGNOSIS — N189 Chronic kidney disease, unspecified: Secondary | ICD-10-CM | POA: Insufficient documentation

## 2018-11-19 ENCOUNTER — Other Ambulatory Visit: Payer: Self-pay | Admitting: Family Medicine

## 2018-11-19 MED ORDER — TRULICITY 0.75 MG/0.5ML ~~LOC~~ SOAJ: 0.7500 mg | SUBCUTANEOUS | 3 refills | Status: DC

## 2018-11-19 NOTE — Telephone Encounter (Signed)
Medication Refill - Medication: trulicity   Has the patient contacted their pharmacy? Yes.   Pt states she is needing this medication by Saturday. Please advise.  (Agent: If no, request that the patient contact the pharmacy for the refill.) (Agent: If yes, when and what did the pharmacy advise?)  Preferred Pharmacy (with phone number or street name):  [id number] [place], [place]  [address] [place] [address]  Phone: [phone] Fax: [phone]  Not a 24 hour pharmacy; exact hours not known.     Agent: Please be advised that RX refills may take up to 3 business days. We ask that you follow-up with your pharmacy.

## 2018-12-02 ENCOUNTER — Other Ambulatory Visit: Payer: Self-pay

## 2018-12-02 MED ORDER — PAROXETINE HCL 20 MG PO TABS: ORAL_TABLET | ORAL | 1 refills | Status: DC

## 2018-12-02 NOTE — Telephone Encounter (Signed)
Last OV 11/03/18 Last fill #90/1

## 2018-12-14 ENCOUNTER — Other Ambulatory Visit: Payer: Self-pay | Admitting: Family Medicine

## 2018-12-30 NOTE — Progress Notes (Signed)
Date:  01/01/2019   ID:  Yarnell, Kozloski 01-14-1941, MRN [id number]  Provider Location: Office  PCP:  Lucille Passy, MD  Cardiologist:   Johnsie Cancel Electrophysiologist:  None   Evaluation Performed:  Follow-Up Visit  Chief Complaint:  CAD  History of Present Illness:    78 y.o. [name]  female with a history of CAD, s/p RCA HSRA and DES (Taxus) in 2007. Cath in 2008 showed patent RCA and no significant Lt system disease. She has done well from a cardiac standpoint. She had breast cancer in 2012 and has not had recurrence. She denies any palpitations, dyspnea, or chest pain. She has chronic left bruit with duplex  May 2019 showing plaque no stenosis    Echo 12/11/17 with EF 55-60% moderate AS mean gradient 19 mmHg peak 35 mmHg and moderate AR DVI 0.32   Myovue reviewed 11/22/15 normal no ischemia EF 02%   Had uncomplicated lap hysterectomy Dr Julien Girt 04/05/16    Pain in legs seems to be from , arthritis and neuropathy in feet Has had cortisone shots in knees and back with good relief  ABI"s 01/17/17 normal circulation  Her grandson Heide Spark works at [employer] with my niece Barnetta Hammersmith and both Exposed to [employer] has test pending with no symptoms   The patient  does not have symptoms concerning for COVID-19 infection (fever, chills, cough, or new shortness of breath).    Past Medical History:  Diagnosis Date  . Allergy   . Anemia    childhood  . Anxiety   . Arterial stenosis (HCC)    mesenteric  . Arthritis   . Breast cancer ([place]) 12/05/10   R breast, inv mammary, in situ,ER/PR +,HER2 -  . Cancer ([place])   . Carcinoma of breast treated with adjuvant chemotherapy ([place])   . Coronary artery disease    stent 2007  . Depression   . Diabetes mellitus   . Difficulty sleeping   . Diverticulosis 12/10/03  . Elevated cholesterol   . Generalized weakness   . GERD (gastroesophageal reflux disease)   . Heart murmur    secondary to chem. 2013  . Hepatitis    as  an infant  . HSV-1 (herpes simplex virus 1) infection   . Hyperlipidemia   . Hypertension   . Hypothyroidism   . Osteoarthritis   . Personal history of chemotherapy   . Personal history of radiation therapy   . Pneumonia    2014 september  . 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 Spinal stenosis   . Thyroid disease    Past Surgical History:  Procedure Laterality Date  . ANTERIOR AND POSTERIOR REPAIR N/A 04/05/2016   Procedure: ANTERIOR (CYSTOCELE) AND POSTERIOR REPAIR (RECTOCELE);  Surgeon: [name], MD;  Location: [facility];  Service: Gynecology;  Laterality: N/A;  . BREAST BIOPSY    . BREAST LUMPECTOMY  1983   benign biopsy  . BREAST LUMPECTOMY Right 12/29/2010   snbx, ER/PR +, Her2 -, 0/1 node pos.  . CAROTID STENT  2007  . CARPAL TUNNEL RELEASE Bilateral 9/12,8,12  . CHOLECYSTECTOMY    . COLONOSCOPY N/A 02/09/2013   Procedure: COLONOSCOPY;  Surgeon: [name], MD;  Location: [facility];  Service: Endoscopy;  Laterality: N/A;  . COLONOSCOPY    . CORONARY ANGIOPLASTY    . DILATION AND CURETTAGE OF UTERUS    . ESOPHAGOGASTRODUODENOSCOPY N/A 02/09/2013   Procedure: ESOPHAGOGASTRODUODENOSCOPY (EGD);  Surgeon: Sydell Axon  Andris Baumann, MD;  Location: [name];  Service: Endoscopy;  Laterality: N/A;  . FOOT SPURS    . HYSTEROSCOPY W/D&C N/A 09/11/2012   Procedure: DILATATION AND CURETTAGE ;  Surgeon: [name], MD;  Location: [facility];  Service: Gynecology;  Laterality: N/A;  . KNEE SURGERY  2007  . LAPAROSCOPIC ASSISTED VAGINAL HYSTERECTOMY N/A 04/05/2016   Procedure: LAPAROSCOPIC ASSISTED VAGINAL HYSTERECTOMY possible BSO;  Surgeon: [name], MD;  Location: [facility];  Service: Gynecology;  Laterality: N/A;  . LUMBAR LAMINECTOMY/DECOMPRESSION MICRODISCECTOMY N/A 10/20/2014   Procedure: MICRO LUMBER DECOMPRESSION L3-4 L4-5;  Surgeon: [name], MD;  Location: [facility];  Service: Orthopedics;  Laterality: N/A;  . LUMBAR LAMINECTOMY/DECOMPRESSION  MICRODISCECTOMY Bilateral 10/13/2015   Procedure: MICRO LUMBAR DECOMPRESSION L5 - S1 AND REDO DECOMPRESSION L4 - L5 AND REMOVAL OF FACET CYST L4 - L5 2 LEVELS;  Surgeon: [name], MD;  Location: [facility];  Service: Orthopedics;  Laterality: Bilateral;  . PORT-A-CATH REMOVAL  04/17/2011   Procedure: REMOVAL PORT-A-CATH;  Surgeon: [name], MD;  Location: [facility];  Service: General;  Laterality: Left;  . PORTACATH PLACEMENT  02/07/2011   Procedure: INSERTION PORT-A-CATH;  Surgeon: [name], MD;  Location: [facility];  Service: General;  Laterality: N/A;  . UPPER GASTROINTESTINAL ENDOSCOPY       Current Meds  Medication Sig  . ACCU-CHEK GUIDE test strip TEST ONCE DAILY  . aspirin 81 MG tablet Take 1 tablet (81 mg total) by mouth daily. Resume 4 days post-op  . chlorthalidone (HYGROTON) 25 MG tablet TAKE 1/2 TABLET(12.5 MG) BY MOUTH EVERY DAY FOR 3 DAYS THEN TAKE 1 TABLET(25 MG) BY MOUTH EVERY DAY  . Cholecalciferol (VITAMIN D) 2000 units CAPS Take 1 capsule by mouth daily.  . clobetasol cream (TEMOVATE) 1.94 % Apply 1 application topically 2 (two) times daily.  . Dulaglutide (TRULICITY) 1.74 YC/1.4GY SOPN Inject 0.75 mg into the skin once a week.  . ferrous sulfate (FERROUSUL) 325 (65 FE) MG tablet Take 1 tablet (325 mg total) by mouth daily with breakfast.  . glipiZIDE (GLUCOTROL XL) 10 MG 24 hr tablet Take 1 tablet (10 mg total) by mouth daily with breakfast.  . JANUVIA 100 MG tablet TAKE 1 TABLET BY MOUTH  DAILY  . Lancet Device MISC 100 each by Does not apply route daily. UAD for daily glucose monitoring  . Lancets Misc. (ACCU-CHEK MULTICLIX LANCET DEV) KIT Use as directed to check sugars  . levothyroxine (SYNTHROID) 75 MCG tablet TAKE 1 TABLET BY MOUTH ONCE DAILY AT 6 AM  . losartan (COZAAR) 50 MG tablet Take 1 tablet (50 mg total) by mouth daily.  . Magnesium Cl-Calcium Carbonate (SLOW MAGNESIUM/CALCIUM PO) Take 1 tablet by mouth daily.  . metFORMIN  (GLUCOPHAGE) 500 MG tablet Take 1 tablet (500 mg total) by mouth 2 (two) times daily with a meal. (Patient taking differently: Take 500 mg by mouth daily with breakfast. )  . methotrexate 2.5 MG tablet Take 10 mg by mouth once a week.  . nitroGLYCERIN (NITROSTAT) 0.4 MG SL tablet Place 1 tablet (0.4 mg total) under the tongue every 5 (five) minutes as needed for chest pain.  . Omega-3 Fatty Acids (OMEGA-3 FISH OIL) 1200 MG CAPS 1,200 mg Two (2) times a day.  . pantoprazole (PROTONIX) 40 MG tablet TAKE 1 TABLET BY MOUTH  DAILY  . PARoxetine (PAXIL) 20 MG tablet TAKE 1 TABLET BY MOUTH EVERY DAY IN THE MORNING  . valACYclovir (VALTREX) 1000 MG tablet TAKE  1 TABLET BY MOUTH 2  TIMES DAILY AS NEEDED FOR  VIRAL INFECTION.  . vitamin B-12 (CYANOCOBALAMIN) 500 MCG tablet Take 500 mcg by mouth daily.     Allergies:   Codeine; Antihistamines, diphenhydramine-type; Gabapentin; Statins; and Erythromycin   Social History   Tobacco Use  . Smoking status: Former Smoker    Packs/day: 2.00    Years: 40.00    Pack years: 80.00    Quit date: 12/19/1997    Years since quitting: 21.0  . Smokeless tobacco: Never Used  Substance Use Topics  . Alcohol use: Yes    Alcohol/week: 6.0 standard drinks    Types: 6 Glasses of wine per week    Comment: occasional  . Drug use: No     Family Hx: The patient's family history includes Alcoholism in her father; Cancer in her son; Diabetes in her brother; Heart attack in her brother and father; Heart disease in her mother; Hypertension in her brother; Lymphoma (age of onset: 32) in her brother; Throat cancer in her father. There is no history of Colon cancer, Stroke, Esophageal cancer, Rectal cancer, or Stomach cancer.  ROS:   Please see the history of present illness.     All other systems reviewed and are negative.   Prior CV studies:   The following studies were reviewed today:  Echo 12/11/17 ABI's 01/17/17 Carotid:  06/05/17   Labs/Other Tests and Data  Reviewed:    EKG:   SR LVH PVC lateral T wave changes 01/01/19 SR rate 88 LVH   Recent Labs: 04/07/2018: Pro B Natriuretic peptide (BNP) 82.0 11/03/2018: ALT 16; BUN 24; Creatinine, Ser 1.41; Hemoglobin 12.7; Platelets 279.0; Potassium 4.8; Sodium 136; TSH 5.39   Recent Lipid Panel Lab Results  Component Value Date/Time   CHOL 190 11/03/2018 09:16 AM   TRIG 238.0 (H) 11/03/2018 09:16 AM   HDL 36.00 (L) 11/03/2018 09:16 AM   CHOLHDL 5 11/03/2018 09:16 AM   LDLCALC 83 05/13/2008 09:29 AM   LDLDIRECT 125.0 11/03/2018 09:16 AM    Wt Readings from Last 3 Encounters:  01/01/19 147 lb (66.7 kg)  11/03/18 148 lb 3.2 oz (67.2 kg)  07/21/18 148 lb 12.8 oz (67.5 kg)     Objective:    Vital Signs:  BP 120/68   Pulse 88   Ht _0  (1.6 m)   Wt 147 lb (66.7 kg)   SpO2 98%   BMI 26.04 kg/m    Affect appropriate Healthy:  appears stated age HEENT: normal Neck supple with no adenopathy JVP normal left  bruits no thyromegaly Lungs clear with no wheezing and good diaphragmatic motion Heart:  S1/S2 AS/AR murmur, no rub, gallop or click PMI normal Abdomen: benighn, BS positve, no tenderness, no AAA no bruit.  No HSM or HJR Distal pulses intact with no bruits No edema Neuro non-focal Skin warm and dry No muscular weakness   ASSESSMENT & PLAN:    CAD S/P PCI 2007 RCA HSRA and Taxus stent in 2007 by Dr Lia Foyer.  Re look cath in 2008 OK, no Lt system disease and RCA patent  11/22/15 myovue normal ef 59% Continue medical Rx    Bruit:  Paque no stenosis duplex 06/05/17 ASA f/u May 2021  Essential hypertension Well controlled.  Continue current medications and low sodium Dash type diet. Started on diuretic 08/26/18   Elevated lipids On Welchol, statin intolerant- labs with primary LFTls normal March 2018   Moderate  aortic stenosis Stable LV size/function mean gradient 18  mmHg 12/11/17 moderate AR f/u echo ordered   History of breast cancer Rt mastectomy Dec 2012 followed  by chemo and radiation  Spinal stenosis of lumbar region Surgery Sept 2016 suspect her leg pain is from this  ABI's normal 06/13/15  GERD :  Continue protonix low carb diet finished with carafate   OB/GYN:  Post hysterectomy on 04/05/16  No cardiac complications f/u Dr Julien Girt    COVID-19 Education: The signs and symptoms of COVID-19 were discussed with the patient and how to seek care for testing (follow up with PCP or arrange E-visit).  The importance of social distancing was discussed today.  Time:   Today, I have spent 30 minutes with the patient with telehealth technology discussing the above problems.     Medication Adjustments/Labs and Tests Ordered: Current medicines are reviewed at length with the patient today.  Concerns regarding medicines are outlined above.   Tests Ordered:  Echo for AS/AR  Carotid May 2021 left bruit   Medication Changes:  None   Disposition:  Follow up in a year  Signed, [name], MD  01/01/2019 9:03 AM    [place]

## 2019-01-01 ENCOUNTER — Ambulatory Visit (HOSPITAL_COMMUNITY): Payer: Medicare Other | Attending: Cardiology

## 2019-01-01 ENCOUNTER — Ambulatory Visit (INDEPENDENT_AMBULATORY_CARE_PROVIDER_SITE_OTHER): Payer: Medicare Other | Admitting: Cardiovascular Disease

## 2019-01-01 ENCOUNTER — Other Ambulatory Visit: Payer: Self-pay

## 2019-01-01 ENCOUNTER — Encounter: Payer: Self-pay | Admitting: Cardiovascular Disease

## 2019-01-01 VITALS — BP 120/68 | HR 88 | Ht 63.0 in | Wt 147.0 lb

## 2019-01-01 DIAGNOSIS — I350 Nonrheumatic aortic (valve) stenosis: Secondary | ICD-10-CM

## 2019-01-01 DIAGNOSIS — I779 Disorder of arteries and arterioles, unspecified: Secondary | ICD-10-CM | POA: Diagnosis not present

## 2019-01-01 LAB — ECHOCARDIOGRAM COMPLETE
Height: 63 in
Weight: 2352 oz

## 2019-01-01 NOTE — Patient Instructions (Addendum)
Medication Instructions:   *If you need a refill on your cardiac medications before your next appointment, please call your pharmacy*  Lab Work:  If you have labs (blood work) drawn today and your tests are completely normal, you will receive your results only by: Marland Kitchen MyChart Message (if you have MyChart) OR . A paper copy in the mail If you have any lab test that is abnormal or we need to change your treatment, we will call you to review the results.  Testing/Procedures: Your physician has requested that you have a carotid duplex in May. This test is an ultrasound of the carotid arteries in your neck. It looks at blood flow through these arteries that supply the brain with blood. Allow one hour for this exam. There are no restrictions or special instructions.  Follow-Up: At [facility], you and your health needs are our priority.  As part of our continuing mission to provide you with exceptional heart care, we have created designated Provider Care Teams.  These Care Teams include your primary Cardiologist (physician) and Advanced Practice Providers (APPs -  Physician Assistants and Nurse Practitioners) who all work together to provide you with the care you need, when you need it.  Your next appointment:   6 month(s)  The format for your next appointment:   In Person  Provider:   You may see [name], MD or one of the following Advanced Practice Providers on your designated Care Team:    Truitt Merle, NP  Cecilie Kicks, NP  Kathyrn Drown, NP

## 2019-01-02 ENCOUNTER — Other Ambulatory Visit: Payer: Self-pay | Admitting: Family Medicine

## 2019-01-02 ENCOUNTER — Other Ambulatory Visit: Payer: Self-pay

## 2019-01-02 ENCOUNTER — Telehealth: Payer: Self-pay

## 2019-01-02 DIAGNOSIS — I350 Nonrheumatic aortic (valve) stenosis: Secondary | ICD-10-CM

## 2019-01-02 MED ORDER — PAROXETINE HCL 20 MG PO TABS: ORAL_TABLET | ORAL | 1 refills | Status: DC

## 2019-01-02 NOTE — Telephone Encounter (Signed)
Patient aware of results. Will place order for echo in one year. 

## 2019-01-02 NOTE — Telephone Encounter (Signed)
-----   Message from Josue Hector, MD sent at 01/02/2019  8:58 AM EST ----- Moderate AS f/u echo in a year

## 2019-01-07 ENCOUNTER — Other Ambulatory Visit: Payer: Self-pay

## 2019-01-07 MED ORDER — GLIPIZIDE ER 10 MG PO TB24: 10.0000 mg | ORAL_TABLET | Freq: Every day | ORAL | 3 refills | Status: DC

## 2019-01-07 MED ORDER — ACCU-CHEK GUIDE VI STRP: ORAL_STRIP | 5 refills | Status: DC

## 2019-01-07 NOTE — Telephone Encounter (Signed)
Last OV 11/03/2018 Last fill for Test strips 05/16/18  #100/5 Last fil for Glucotrol 03/17/18  #90/3

## 2019-02-03 ENCOUNTER — Encounter (HOSPITAL_COMMUNITY): Payer: Medicare Other

## 2019-02-03 DIAGNOSIS — M419 Scoliosis, unspecified: Secondary | ICD-10-CM | POA: Insufficient documentation

## 2019-02-03 DIAGNOSIS — M545 Low back pain, unspecified: Secondary | ICD-10-CM | POA: Insufficient documentation

## 2019-02-04 ENCOUNTER — Ambulatory Visit: Payer: Medicare Other | Admitting: Family Medicine

## 2019-02-09 ENCOUNTER — Telehealth: Payer: Self-pay | Admitting: Family Medicine

## 2019-02-09 ENCOUNTER — Other Ambulatory Visit: Payer: Self-pay

## 2019-02-09 DIAGNOSIS — E1129 Type 2 diabetes mellitus with other diabetic kidney complication: Secondary | ICD-10-CM

## 2019-02-09 DIAGNOSIS — IMO0002 Reserved for concepts with insufficient information to code with codable children: Secondary | ICD-10-CM

## 2019-02-09 NOTE — Telephone Encounter (Signed)
Per TA ok for A1C & BMP/scheduled pt for lab visit tomorrow at 9:40am/thx dmf

## 2019-02-09 NOTE — Telephone Encounter (Signed)
Patient is calling and wanted to just come in and get sugar levels checked but there is not any orders. CB is [phone].

## 2019-02-10 ENCOUNTER — Other Ambulatory Visit (INDEPENDENT_AMBULATORY_CARE_PROVIDER_SITE_OTHER): Payer: Medicare Other

## 2019-02-10 ENCOUNTER — Other Ambulatory Visit: Payer: Self-pay

## 2019-02-10 DIAGNOSIS — IMO0002 Reserved for concepts with insufficient information to code with codable children: Secondary | ICD-10-CM

## 2019-02-10 DIAGNOSIS — E1165 Type 2 diabetes mellitus with hyperglycemia: Secondary | ICD-10-CM | POA: Diagnosis not present

## 2019-02-10 DIAGNOSIS — E1129 Type 2 diabetes mellitus with other diabetic kidney complication: Secondary | ICD-10-CM

## 2019-02-10 LAB — BASIC METABOLIC PANEL
BUN: 29 mg/dL — ABNORMAL HIGH (ref 6–23)
CO2: 26 mEq/L (ref 19–32)
Calcium: 9.5 mg/dL (ref 8.4–10.5)
Chloride: 97 mEq/L (ref 96–112)
Creatinine, Ser: 1.46 mg/dL — ABNORMAL HIGH (ref 0.40–1.20)
GFR: 34.81 mL/min — ABNORMAL LOW (ref >60.00)
Glucose, Bld: 221 mg/dL — ABNORMAL HIGH (ref 70–99)
Potassium: 4.4 mEq/L (ref 3.5–5.1)
Sodium: 133 mEq/L — ABNORMAL LOW (ref 135–145)

## 2019-02-10 LAB — HEMOGLOBIN A1C: Hgb A1c MFr Bld: 9.1 % — ABNORMAL HIGH (ref 4.6–6.5)

## 2019-02-12 ENCOUNTER — Other Ambulatory Visit: Payer: Self-pay

## 2019-02-12 ENCOUNTER — Ambulatory Visit: Payer: Medicare Other | Attending: Internal Medicine

## 2019-02-12 DIAGNOSIS — E1129 Type 2 diabetes mellitus with other diabetic kidney complication: Secondary | ICD-10-CM

## 2019-02-12 DIAGNOSIS — Z23 Encounter for immunization: Secondary | ICD-10-CM | POA: Insufficient documentation

## 2019-02-12 DIAGNOSIS — IMO0002 Reserved for concepts with insufficient information to code with codable children: Secondary | ICD-10-CM

## 2019-02-12 NOTE — Progress Notes (Signed)
   Covid-19 Vaccination Clinic  Name:  [name]    MRN: [id number] DOB: 1941/01/05  02/12/2019  [name] was observed post Covid-19 immunization for 15 minutes without incidence. She was provided with Vaccine Information Sheet and instruction to access the V-Safe system.   [name] was instructed to call 911 with any severe reactions post vaccine: Marland Kitchen Difficulty breathing  . Swelling of your face and throat  . A fast heartbeat  . A bad rash all over your body  . Dizziness and weakness    Immunizations Administered    Name Date Dose VIS Date Route   Pfizer COVID-19 Vaccine 02/12/2019  2:56 PM 0.3 mL 01/02/2019 Intramuscular   Manufacturer: [place]   Lot: [id number]   [place]: [id number]

## 2019-02-27 ENCOUNTER — Telehealth: Payer: Self-pay | Admitting: Family Medicine

## 2019-02-27 NOTE — Telephone Encounter (Signed)
Patient is going to try tripple abx ointment OTC qid and will let us know if it is ineffective next week/thx dmf

## 2019-02-27 NOTE — Telephone Encounter (Signed)
The best option would be for her to ask her pharmacist as those ointments are all OTC.

## 2019-02-27 NOTE — Telephone Encounter (Signed)
TA-Pt is requesting Tx for a sore in her nose/declined a VV/plz advise/thx dmf

## 2019-02-27 NOTE — Telephone Encounter (Signed)
Patient is calling and stated that she has a sore in her nose and wanted to see id DR. Deborra Medina could call her in something to the pharmacy.Liana Gerold pt a app but declined. CB is [phone]

## 2019-03-05 ENCOUNTER — Ambulatory Visit: Payer: Medicare Other | Attending: Internal Medicine

## 2019-03-05 DIAGNOSIS — Z23 Encounter for immunization: Secondary | ICD-10-CM | POA: Insufficient documentation

## 2019-03-05 NOTE — Progress Notes (Signed)
   Covid-19 Vaccination Clinic  Name:  NATAUSHA JUNGWIRTH    MRN: [id number] DOB: 1940-07-08  03/05/2019  Ms. Abruzzese was observed post Covid-19 immunization for 15 minutes without incidence. She was provided with Vaccine Information Sheet and instruction to access the V-Safe system.   Ms. Wendel was instructed to call 911 with any severe reactions post vaccine: Marland Kitchen Difficulty breathing  . Swelling of your face and throat  . A fast heartbeat  . A bad rash all over your body  . Dizziness and weakness    Immunizations Administered    Name Date Dose VIS Date Route   Pfizer COVID-19 Vaccine 03/05/2019  4:19 PM 0.3 mL 01/02/2019 Intramuscular   Manufacturer: [place]   Lot: [id number]   [place]: [id number]

## 2019-03-10 ENCOUNTER — Other Ambulatory Visit: Payer: Self-pay

## 2019-03-10 MED ORDER — TRULICITY 0.75 MG/0.5ML ~~LOC~~ SOAJ: 0.7500 mg | SUBCUTANEOUS | 3 refills | Status: DC

## 2019-03-23 ENCOUNTER — Telehealth: Payer: Self-pay | Admitting: Family Medicine

## 2019-03-23 NOTE — Telephone Encounter (Signed)
Pt scheduled for April

## 2019-03-23 NOTE — Telephone Encounter (Signed)
Patient calling and is a former patient of Dr. Deborra Medina. Patient is requesting a TOC  To Dr. Bryan Lemma. Please advise.

## 2019-04-07 DIAGNOSIS — M069 Rheumatoid arthritis, unspecified: Secondary | ICD-10-CM | POA: Insufficient documentation

## 2019-04-07 DIAGNOSIS — M0609 Rheumatoid arthritis without rheumatoid factor, multiple sites: Secondary | ICD-10-CM | POA: Insufficient documentation

## 2019-04-08 ENCOUNTER — Other Ambulatory Visit: Payer: Self-pay | Admitting: Specialist

## 2019-04-08 ENCOUNTER — Telehealth: Payer: Self-pay

## 2019-04-08 DIAGNOSIS — G8929 Other chronic pain: Secondary | ICD-10-CM

## 2019-04-08 DIAGNOSIS — M545 Low back pain, unspecified: Secondary | ICD-10-CM

## 2019-04-08 NOTE — Telephone Encounter (Signed)
Phone call to patient to verify medication list and allergies for myelogram procedure. Pt instructed to hold Paxil for 48hrs prior to myelogram appointment time. 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 Pt verbalized understanding.  Pt also has our contact  Information if she were to have any questions.

## 2019-04-14 ENCOUNTER — Other Ambulatory Visit (HOSPITAL_COMMUNITY): Payer: Self-pay | Admitting: Specialist

## 2019-04-14 DIAGNOSIS — E1169 Type 2 diabetes mellitus with other specified complication: Secondary | ICD-10-CM

## 2019-04-14 DIAGNOSIS — E0843 Diabetes mellitus due to underlying condition with diabetic autonomic (poly)neuropathy: Secondary | ICD-10-CM

## 2019-04-15 ENCOUNTER — Ambulatory Visit (HOSPITAL_COMMUNITY)
Admission: RE | Admit: 2019-04-15 | Discharge: 2019-04-15 | Disposition: A | Discharge status: Home / Self Care | Payer: Medicare Other | Source: Ambulatory Visit | Attending: Cardiology | Admitting: Cardiology

## 2019-04-15 ENCOUNTER — Other Ambulatory Visit: Payer: Self-pay

## 2019-04-15 DIAGNOSIS — M79652 Pain in left thigh: Secondary | ICD-10-CM | POA: Diagnosis not present

## 2019-04-15 DIAGNOSIS — E0843 Diabetes mellitus due to underlying condition with diabetic autonomic (poly)neuropathy: Secondary | ICD-10-CM

## 2019-04-15 DIAGNOSIS — M79651 Pain in right thigh: Secondary | ICD-10-CM

## 2019-04-16 ENCOUNTER — Other Ambulatory Visit: Payer: Medicare Other

## 2019-04-16 DIAGNOSIS — C50919 Malignant neoplasm of unspecified site of unspecified female breast: Secondary | ICD-10-CM | POA: Insufficient documentation

## 2019-04-16 LAB — HM MAMMOGRAPHY

## 2019-04-16 LAB — HM DEXA SCAN: HM Dexa Scan: NORMAL

## 2019-04-17 ENCOUNTER — Other Ambulatory Visit: Payer: Medicare Other

## 2019-04-17 ENCOUNTER — Inpatient Hospital Stay: Admission: RE | Admit: 2019-04-17 | Payer: Medicare Other | Source: Ambulatory Visit

## 2019-04-21 ENCOUNTER — Encounter: Payer: Self-pay | Admitting: Vascular Surgery

## 2019-04-21 ENCOUNTER — Ambulatory Visit (INDEPENDENT_AMBULATORY_CARE_PROVIDER_SITE_OTHER): Payer: Medicare Other | Admitting: Vascular Surgery

## 2019-04-21 ENCOUNTER — Other Ambulatory Visit: Payer: Self-pay

## 2019-04-21 VITALS — BP 141/91 | HR 89 | Temp 97.5°F | Resp 16 | Ht 63.5 in | Wt 147.0 lb

## 2019-04-21 DIAGNOSIS — I739 Peripheral vascular disease, unspecified: Secondary | ICD-10-CM | POA: Insufficient documentation

## 2019-04-21 NOTE — Progress Notes (Signed)
Patient name: [name] MRN: [id number] DOB: 07/02/1940 Sex: female  REASON FOR CONSULT: Evaluate PAD and lower extremity claudication  HPI: [name] is a 79 y.o. female, with history of hypertension, hyperlipidemia, diabetes, coronary artery disease status post remote PCI with stent, breast cancer that presents for evaluation of PAD and bilateral lower extremity claudication.  Patient states she has had pain in the right thigh and the left calf when walking that started about 3 to 4 months ago.  This has gotten particularly more severe and states she can only walk about 10 to 15 feet before she has to stop.  She went to the grocery store earlier this week and states she had to almost lay on the floor on 3-4 different occasions.  She denies any previous lower extremity interventions or bypasses.  She does have a coronary stent.  Denies any active tobacco abuse at this time and states she quit smoking years ago.  She is very debilitated and cannot even work in her garden.  She was being seen by Dr. Tonita Cong and he obtained some arterial vascular studies and sent her over for further evaluation.  Her ABIs were 0.61 on the right and 0.60 on the left and monophasic at both ankles.  Past Medical History:  Diagnosis Date  . Allergy   . Anemia    childhood  . Anxiety   . Arterial stenosis (HCC)    mesenteric  . Arthritis   . Breast cancer ([facility] [place]) 12/05/10   R breast, inv mammary, in situ,ER/PR +,HER2 -  . Cancer ([place])   . Carcinoma of breast treated with adjuvant chemotherapy ([place])   . Coronary artery disease    stent 2007  . Depression   . Diabetes mellitus   . Difficulty sleeping   . Diverticulosis 12/10/03  . Elevated cholesterol   . Generalized weakness   . GERD (gastroesophageal reflux disease)   . Heart murmur    secondary to chem. 2013  . Hepatitis    as an infant  . HSV-1 (herpes simplex virus 1) infection   . Hyperlipidemia   . Hypertension   . Hypothyroidism   .  Osteoarthritis   . Personal history of chemotherapy   . Personal history of radiation therapy   . Pneumonia    2014 september  . 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 Spinal stenosis   . Thyroid disease     Past Surgical History:  Procedure Laterality Date  . ANTERIOR AND POSTERIOR REPAIR N/A 04/05/2016   Procedure: ANTERIOR (CYSTOCELE) AND POSTERIOR REPAIR (RECTOCELE);  Surgeon: [name], MD;  Location: [facility];  Service: Gynecology;  Laterality: N/A;  . BREAST BIOPSY    . BREAST LUMPECTOMY  1983   benign biopsy  . BREAST LUMPECTOMY Right 12/29/2010   snbx, ER/PR +, Her2 -, 0/1 node pos.  . CAROTID STENT  2007  . CARPAL TUNNEL RELEASE Bilateral 9/12,8,12  . CHOLECYSTECTOMY    . COLONOSCOPY N/A 02/09/2013   Procedure: COLONOSCOPY;  Surgeon: [name], MD;  Location: [facility];  Service: Endoscopy;  Laterality: N/A;  . COLONOSCOPY    . CORONARY ANGIOPLASTY    . DILATION AND CURETTAGE OF UTERUS    . ESOPHAGOGASTRODUODENOSCOPY N/A 02/09/2013   Procedure: ESOPHAGOGASTRODUODENOSCOPY (EGD);  Surgeon: [name], MD;  Location: [name];  Service: Endoscopy;  Laterality: N/A;  . FOOT SPURS    . HYSTEROSCOPY WITH D & C N/A 09/11/2012  Procedure: DILATATION AND CURETTAGE ;  Surgeon: [name], MD;  Location: [facility];  Service: Gynecology;  Laterality: N/A;  . KNEE SURGERY  2007  . LAPAROSCOPIC ASSISTED VAGINAL HYSTERECTOMY N/A 04/05/2016   Procedure: LAPAROSCOPIC ASSISTED VAGINAL HYSTERECTOMY possible BSO;  Surgeon: [name], MD;  Location: [facility];  Service: Gynecology;  Laterality: N/A;  . LUMBAR LAMINECTOMY/DECOMPRESSION MICRODISCECTOMY N/A 10/20/2014   Procedure: MICRO LUMBER DECOMPRESSION L3-4 L4-5;  Surgeon: [name], MD;  Location: [facility];  Service: Orthopedics;  Laterality: N/A;  . LUMBAR LAMINECTOMY/DECOMPRESSION MICRODISCECTOMY Bilateral 10/13/2015   Procedure: MICRO LUMBAR DECOMPRESSION L5 - S1 AND REDO DECOMPRESSION L4 - L5 AND  REMOVAL OF FACET CYST L4 - L5 2 LEVELS;  Surgeon: [name], MD;  Location: [facility];  Service: Orthopedics;  Laterality: Bilateral;  . PORT-A-CATH REMOVAL  04/17/2011   Procedure: REMOVAL PORT-A-CATH;  Surgeon: [name], MD;  Location: [facility];  Service: General;  Laterality: Left;  . PORTACATH PLACEMENT  02/07/2011   Procedure: INSERTION PORT-A-CATH;  Surgeon: [name], MD;  Location: [facility];  Service: General;  Laterality: N/A;  . UPPER GASTROINTESTINAL ENDOSCOPY      Family History  Problem Relation Age of Onset  . Throat cancer Father   . Alcoholism Father   . Heart attack Father   . Heart disease Mother   . Lymphoma Brother 63  . Cancer Son   . Heart attack Brother   . Diabetes Brother   . Hypertension Brother   . Colon cancer Neg Hx   . Stroke Neg Hx   . Esophageal cancer Neg Hx   . Rectal cancer Neg Hx   . Stomach cancer Neg Hx     SOCIAL HISTORY: Social History   Socioeconomic History  . Marital status: Married    Spouse name: Not on file  . Number of children: 2  . Years of education: Not on file  . Highest education level: Not on file  Occupational History  . Occupation: retired  Tobacco Use  . Smoking status: Former Smoker    Packs/day: 2.00    Years: 40.00    Pack years: 80.00    Quit date: 12/19/1997    Years since quitting: 21.3  . Smokeless tobacco: Never Used  Substance and Sexual Activity  . Alcohol use: Yes    Alcohol/week: 6.0 standard drinks    Types: 6 Glasses of wine per week    Comment: occasional  . Drug use: No  . Sexual activity: Not on file    Comment: menarch age 27, P3, HRT X 10 YRS, MENOPAUSE MID 40'S  Other Topics Concern  . Not on file  Social History Narrative   [name] age 59- [place] age 62  [place]   Social Determinants of Health   Financial Resource Strain:   . Difficulty of Paying Living Expenses:   Food Insecurity:   . Worried About Charity fundraiser in the Last  Year:   . Arboriculturist in the Last Year:   Transportation Needs:   . Film/video editor (Medical):   Marland Kitchen Lack of Transportation (Non-Medical):   Physical Activity:   . Days of Exercise per Week:   . Minutes of Exercise per Session:   Stress:   . Feeling of Stress :   Social Connections:   . Frequency of Communication with Friends and Family:   . Frequency of Social Gatherings with Friends and Family:   . Attends Religious Services:   .  Active Member of Clubs or Organizations:   . Attends Archivist Meetings:   Marland Kitchen Marital Status:   Intimate Partner Violence:   . Fear of Current or Ex-Partner:   . Emotionally Abused:   Marland Kitchen Physically Abused:   . Sexually Abused:     Allergies  Allergen Reactions  . Codeine Nausea And Vomiting and Other (See Comments)    Severe stomach cramps  . Antihistamines, Diphenhydramine-Type Other (See Comments)    Causes hyperactivity  . Gabapentin     Urinary incontinence  . Statins Other (See Comments)    Joint pains  . Sulfa Antibiotics   . Erythromycin Hives and Rash    Due to dental work about 50 years ago. Was used in packing and resulted in rash/hives inside and outside of mouth.    Current Outpatient Medications  Medication Sig Dispense Refill  . acetaminophen (TYLENOL) 325 MG tablet Take 650 mg by mouth every 6 (six) hours as needed.    Marland Kitchen aspirin 81 MG tablet Take 1 tablet (81 mg total) by mouth daily. Resume 4 days post-op    . Cholecalciferol (VITAMIN D) 2000 units CAPS Take 1 capsule by mouth daily.    . clobetasol cream (TEMOVATE) 4.08 % Apply 1 application topically 2 (two) times daily. 30 g 0  . Dulaglutide (TRULICITY) 1.44 YJ/8.5UD SOPN Inject 0.75 mg into the skin once a week. 1 pen 3  . ferrous sulfate (FERROUSUL) 325 (65 FE) MG tablet Take 1 tablet (325 mg total) by mouth daily with breakfast. 30 tablet 3  . glipiZIDE (GLUCOTROL XL) 10 MG 24 hr tablet Take 1 tablet (10 mg total) by mouth daily with breakfast. 90  tablet 3  . glucose blood (ACCU-CHEK GUIDE) test strip TEST ONCE DAILY 100 each 5  . JANUVIA 100 MG tablet TAKE 1 TABLET BY MOUTH  DAILY 90 tablet 3  . Lancet Device MISC 100 each by Does not apply route daily. UAD for daily glucose monitoring 100 each PRN  . Lancets Misc. (ACCU-CHEK MULTICLIX LANCET DEV) KIT Use as directed to check sugars    . levothyroxine (SYNTHROID) 75 MCG tablet TAKE 1 TABLET BY MOUTH ONCE DAILY AT 6 AM 90 tablet 1  . losartan (COZAAR) 50 MG tablet TAKE 1 TABLET BY MOUTH  DAILY 90 tablet 3  . metFORMIN (GLUCOPHAGE) 500 MG tablet Take 1 tablet (500 mg total) by mouth daily with breakfast. 90 tablet 3  . nitroGLYCERIN (NITROSTAT) 0.4 MG SL tablet Place 1 tablet (0.4 mg total) under the tongue every 5 (five) minutes as needed for chest pain. 25 tablet 3  . pantoprazole (PROTONIX) 40 MG tablet TAKE 1 TABLET BY MOUTH  DAILY 90 tablet 1  . PARoxetine (PAXIL) 20 MG tablet TAKE 1 TABLET BY MOUTH EVERY DAY IN THE MORNING 90 tablet 1  . valACYclovir (VALTREX) 1000 MG tablet TAKE 1 TABLET BY MOUTH 2  TIMES DAILY AS NEEDED FOR  VIRAL INFECTION. 180 tablet 0  . vitamin B-12 (CYANOCOBALAMIN) 500 MCG tablet Take 500 mcg by mouth daily.    . chlorthalidone (HYGROTON) 25 MG tablet TAKE 1/2 TABLET(12.5 MG) BY MOUTH EVERY DAY FOR 3 DAYS THEN TAKE 1 TABLET(25 MG) BY MOUTH EVERY DAY (Patient not taking: Reported on 04/08/2019) 90 tablet 0  . Magnesium Cl-Calcium Carbonate (SLOW MAGNESIUM/CALCIUM PO) Take 1 tablet by mouth daily.    . methotrexate 2.5 MG tablet Take 10 mg by mouth once a week.    . Omega-3 Fatty Acids (OMEGA-3 FISH  OIL) 1200 MG CAPS 1,200 mg Two (2) times a day.     No current facility-administered medications for this visit.    REVIEW OF SYSTEMS:  '[X]'  denotes positive finding, '[ ]'  denotes negative finding Cardiac  Comments:  Chest pain or chest pressure:    Shortness of breath upon exertion:    Short of breath when lying flat:    Irregular heart rhythm:        Vascular     Pain in calf, thigh, or hip brought on by ambulation: x Right thigh, left calf  Pain in feet at night that wakes you up from your sleep:     Blood clot in your veins:    Leg swelling:         Pulmonary    Oxygen at home:    Productive cough:     Wheezing:         Neurologic    Sudden weakness in arms or legs:     Sudden numbness in arms or legs:     Sudden onset of difficulty speaking or slurred speech:    Temporary loss of vision in one eye:     Problems with dizziness:         Gastrointestinal    Blood in stool:     Vomited blood:         Genitourinary    Burning when urinating:     Blood in urine:        Psychiatric    Major depression:         Hematologic    Bleeding problems:    Problems with blood clotting too easily:        Skin    Rashes or ulcers:        Constitutional    Fever or chills:      PHYSICAL EXAM: Vitals:   04/21/19 1032  BP: (!) 141/91  Pulse: 89  Resp: 16  Temp: (!) 97.5 F (36.4 C)  TempSrc: Temporal  SpO2: 98%  Weight: 147 lb (66.7 kg)  Height: 5' 3.5" (1.613 m)    GENERAL: The patient is a well-nourished female, in no acute distress. The vital signs are documented above. CARDIAC: There is a regular rate and rhythm.  VASCULAR:  Palpable femoral pulses bilaterally No palpable pedal pulses No lower extremity tissue loss PULMONARY: There is good air exchange bilaterally without wheezing or rales. ABDOMEN: Soft and non-tender with normal pitched bowel sounds.  MUSCULOSKELETAL: There are no major deformities or cyanosis. NEUROLOGIC: No focal weakness or paresthesias are detected. SKIN: There are no ulcers or rashes noted. PSYCHIATRIC: The patient has a normal affect.  DATA:   ABIs from 04/15/2019 showed ABI 0.61 on the right and 0.6 on the left - monophasic both ankles  Right lower extremity arterial duplex showed a high-grade stenosis in the distal SFA with some additional moderate disease proximally.  There is a high-grade  AT and PT stenosis on the right.  Left lower extremity arterial duplex shows a high-grade stenosis of the distal SFA.  High grade stenosis in the TP trunk and PT.   Assessment/Plan:  79 year old female the presents with short distance lifestyle limiting claudication of the bilateral lower extremities.  She has palpable femoral pulses bilaterally and her exam and noninvasive imaging suggest infrainguinal disease in review of her duplexes.  She has multilevel disease including SFA and tibial disease.  Discussed with her that we typically manage claudication conservatively with exercise and walking therapies  as well as aspirin statin etc.  She does not feel she can manage with her current symptoms.  This is obviously lifestyle limiting given she cannot walk more than 10 -15 feet and cannot go about her daily activities.  As result we recommended aortogram, lower extremity arteriogram, and possible intervention.  We will schedule for next Wednesday.  Risks and benefits discussed in detail.   Marty Heck, MD [facility] Office: [phone]

## 2019-04-28 ENCOUNTER — Other Ambulatory Visit (HOSPITAL_COMMUNITY)
Admission: RE | Admit: 2019-04-28 | Discharge: 2019-04-28 | Disposition: A | Discharge status: Home / Self Care | Payer: Medicare Other | Source: Ambulatory Visit | Attending: Vascular Surgery | Admitting: Vascular Surgery

## 2019-04-28 DIAGNOSIS — Z20822 Contact with and (suspected) exposure to covid-19: Secondary | ICD-10-CM | POA: Insufficient documentation

## 2019-04-28 DIAGNOSIS — Z01812 Encounter for preprocedural laboratory examination: Secondary | ICD-10-CM | POA: Diagnosis present

## 2019-04-28 LAB — SARS CORONAVIRUS 2 (TAT 6-24 HRS): SARS Coronavirus 2: NEGATIVE

## 2019-04-29 ENCOUNTER — Other Ambulatory Visit: Payer: Self-pay

## 2019-04-29 ENCOUNTER — Observation Stay (HOSPITAL_COMMUNITY)
Admission: RE | Admit: 2019-04-29 | Discharge: 2019-04-30 | Disposition: A | Discharge status: Home / Self Care | Payer: Medicare Other | Attending: Vascular Surgery | Admitting: Vascular Surgery

## 2019-04-29 ENCOUNTER — Ambulatory Visit (HOSPITAL_COMMUNITY)
Admission: RE | Disposition: A | Discharge status: Home / Self Care | Payer: Self-pay | Source: Home / Self Care | Attending: Vascular Surgery

## 2019-04-29 DIAGNOSIS — Z923 Personal history of irradiation: Secondary | ICD-10-CM | POA: Insufficient documentation

## 2019-04-29 DIAGNOSIS — I70213 Atherosclerosis of native arteries of extremities with intermittent claudication, bilateral legs: Principal | ICD-10-CM | POA: Insufficient documentation

## 2019-04-29 DIAGNOSIS — E785 Hyperlipidemia, unspecified: Secondary | ICD-10-CM | POA: Diagnosis not present

## 2019-04-29 DIAGNOSIS — I739 Peripheral vascular disease, unspecified: Secondary | ICD-10-CM | POA: Diagnosis not present

## 2019-04-29 DIAGNOSIS — Z888 Allergy status to other drugs, medicaments and biological substances status: Secondary | ICD-10-CM | POA: Diagnosis not present

## 2019-04-29 DIAGNOSIS — Z7984 Long term (current) use of oral hypoglycemic drugs: Secondary | ICD-10-CM | POA: Insufficient documentation

## 2019-04-29 DIAGNOSIS — Z955 Presence of coronary angioplasty implant and graft: Secondary | ICD-10-CM | POA: Insufficient documentation

## 2019-04-29 DIAGNOSIS — R001 Bradycardia, unspecified: Secondary | ICD-10-CM

## 2019-04-29 DIAGNOSIS — Z885 Allergy status to narcotic agent status: Secondary | ICD-10-CM | POA: Insufficient documentation

## 2019-04-29 DIAGNOSIS — Z7982 Long term (current) use of aspirin: Secondary | ICD-10-CM | POA: Diagnosis not present

## 2019-04-29 DIAGNOSIS — Z79890 Hormone replacement therapy (postmenopausal): Secondary | ICD-10-CM | POA: Insufficient documentation

## 2019-04-29 DIAGNOSIS — Z882 Allergy status to sulfonamides status: Secondary | ICD-10-CM | POA: Diagnosis not present

## 2019-04-29 DIAGNOSIS — Z853 Personal history of malignant neoplasm of breast: Secondary | ICD-10-CM | POA: Insufficient documentation

## 2019-04-29 DIAGNOSIS — K219 Gastro-esophageal reflux disease without esophagitis: Secondary | ICD-10-CM | POA: Diagnosis not present

## 2019-04-29 DIAGNOSIS — I2510 Atherosclerotic heart disease of native coronary artery without angina pectoris: Secondary | ICD-10-CM | POA: Diagnosis not present

## 2019-04-29 DIAGNOSIS — I10 Essential (primary) hypertension: Secondary | ICD-10-CM | POA: Diagnosis not present

## 2019-04-29 DIAGNOSIS — Z79899 Other long term (current) drug therapy: Secondary | ICD-10-CM | POA: Insufficient documentation

## 2019-04-29 DIAGNOSIS — F329 Major depressive disorder, single episode, unspecified: Secondary | ICD-10-CM | POA: Insufficient documentation

## 2019-04-29 DIAGNOSIS — E039 Hypothyroidism, unspecified: Secondary | ICD-10-CM | POA: Diagnosis not present

## 2019-04-29 DIAGNOSIS — Z87891 Personal history of nicotine dependence: Secondary | ICD-10-CM | POA: Diagnosis not present

## 2019-04-29 DIAGNOSIS — E1151 Type 2 diabetes mellitus with diabetic peripheral angiopathy without gangrene: Secondary | ICD-10-CM | POA: Insufficient documentation

## 2019-04-29 DIAGNOSIS — Z9221 Personal history of antineoplastic chemotherapy: Secondary | ICD-10-CM | POA: Insufficient documentation

## 2019-04-29 DIAGNOSIS — F419 Anxiety disorder, unspecified: Secondary | ICD-10-CM | POA: Insufficient documentation

## 2019-04-29 HISTORY — PX: ABDOMINAL AORTOGRAM W/LOWER EXTREMITY: CATH118223

## 2019-04-29 HISTORY — PX: PERIPHERAL VASCULAR INTERVENTION: CATH118257

## 2019-04-29 LAB — GLUCOSE, CAPILLARY
Glucose-Capillary: 183 mg/dL — ABNORMAL HIGH (ref 70–99)
Glucose-Capillary: 234 mg/dL — ABNORMAL HIGH (ref 70–99)
Glucose-Capillary: 242 mg/dL — ABNORMAL HIGH (ref 70–99)
Glucose-Capillary: 336 mg/dL — ABNORMAL HIGH (ref 70–99)

## 2019-04-29 LAB — POCT I-STAT, CHEM 8
BUN: 34 mg/dL — ABNORMAL HIGH (ref 8–23)
Calcium, Ion: 1.22 mmol/L (ref 1.15–1.40)
Chloride: 103 mmol/L (ref 98–111)
Creatinine, Ser: 1.8 mg/dL — ABNORMAL HIGH (ref 0.44–1.00)
Glucose, Bld: 178 mg/dL — ABNORMAL HIGH (ref 70–99)
HCT: 40 % (ref 36.0–46.0)
Hemoglobin: 13.6 g/dL (ref 12.0–15.0)
Potassium: 4.4 mmol/L (ref 3.5–5.1)
Sodium: 137 mmol/L (ref 135–145)
TCO2: 27 mmol/L (ref 22–32)

## 2019-04-29 LAB — COMPREHENSIVE METABOLIC PANEL
ALT: 18 U/L (ref 0–44)
AST: 23 U/L (ref 15–41)
Albumin: 2.9 g/dL — ABNORMAL LOW (ref 3.5–5.0)
Alkaline Phosphatase: 58 U/L (ref 38–126)
Anion gap: 11 (ref 5–15)
BUN: 29 mg/dL — ABNORMAL HIGH (ref 8–23)
CO2: 21 mmol/L — ABNORMAL LOW (ref 22–32)
Calcium: 8.6 mg/dL — ABNORMAL LOW (ref 8.9–10.3)
Chloride: 105 mmol/L (ref 98–111)
Creatinine, Ser: 1.46 mg/dL — ABNORMAL HIGH (ref 0.44–1.00)
GFR calc Af Amer: 40 mL/min — ABNORMAL LOW (ref >60)
GFR calc non Af Amer: 35 mL/min — ABNORMAL LOW (ref >60)
Glucose, Bld: 199 mg/dL — ABNORMAL HIGH (ref 70–99)
Potassium: 4.1 mmol/L (ref 3.5–5.1)
Sodium: 137 mmol/L (ref 135–145)
Total Bilirubin: 0.5 mg/dL (ref 0.3–1.2)
Total Protein: 5.7 g/dL — ABNORMAL LOW (ref 6.5–8.1)

## 2019-04-29 LAB — CBC WITH DIFFERENTIAL/PLATELET
Abs Immature Granulocytes: 0.16 10*3/uL — ABNORMAL HIGH (ref 0.00–0.07)
Basophils Absolute: 0 10*3/uL (ref 0.0–0.1)
Basophils Relative: 0 %
Eosinophils Absolute: 0 10*3/uL (ref 0.0–0.5)
Eosinophils Relative: 0 %
HCT: 41.6 % (ref 36.0–46.0)
Hemoglobin: 13.9 g/dL (ref 12.0–15.0)
Immature Granulocytes: 3 %
Lymphocytes Relative: 38 %
Lymphs Abs: 2.3 10*3/uL (ref 0.7–4.0)
MCH: 31.1 pg (ref 26.0–34.0)
MCHC: 33.4 g/dL (ref 30.0–36.0)
MCV: 93.1 fL (ref 80.0–100.0)
Monocytes Absolute: 0.3 10*3/uL (ref 0.1–1.0)
Monocytes Relative: 5 %
Neutro Abs: 3.2 10*3/uL (ref 1.7–7.7)
Neutrophils Relative %: 54 %
Platelets: 206 10*3/uL (ref 150–400)
RBC: 4.47 MIL/uL (ref 3.87–5.11)
RDW: 13.7 % (ref 11.5–15.5)
WBC: 5.9 10*3/uL (ref 4.0–10.5)
nRBC: 0 % (ref 0.0–0.2)

## 2019-04-29 LAB — TROPONIN I (HIGH SENSITIVITY)
Troponin I (High Sensitivity): 19 ng/L — ABNORMAL HIGH (ref <18)
Troponin I (High Sensitivity): 20 ng/L — ABNORMAL HIGH (ref <18)
Troponin I (High Sensitivity): 19 ng/L — ABNORMAL HIGH (ref <18)

## 2019-04-29 LAB — CK TOTAL AND CKMB (NOT AT ARMC)
CK, MB: 1.7 ng/mL (ref 0.5–5.0)
Relative Index: INVALID (ref 0.0–2.5)
Total CK: 36 U/L — ABNORMAL LOW (ref 38–234)

## 2019-04-29 SURGERY — ABDOMINAL AORTOGRAM W/LOWER EXTREMITY
Anesthesia: LOCAL | Laterality: Right

## 2019-04-29 MED ORDER — LINAGLIPTIN 5 MG PO TABS
5.0000 mg | ORAL_TABLET | Freq: Every day | ORAL | Status: DC
  Administered 2019-04-29: 15:00:00 5 mg via ORAL
  Filled 2019-04-29 (×2): qty 1

## 2019-04-29 MED ORDER — LIDOCAINE HCL (PF) 1 % IJ SOLN
INTRAMUSCULAR | Status: DC | PRN
  Administered 2019-04-29: 15 mL via INTRADERMAL

## 2019-04-29 MED ORDER — LABETALOL HCL 5 MG/ML IV SOLN
INTRAVENOUS | Status: DC | PRN
  Administered 2019-04-29: 10 mg via INTRAVENOUS

## 2019-04-29 MED ORDER — INSULIN ASPART 100 UNIT/ML ~~LOC~~ SOLN
0.0000 [IU] | Freq: Three times a day (TID) | SUBCUTANEOUS | Status: DC
  Administered 2019-04-29 – 2019-04-30 (×2): 5 [IU] via SUBCUTANEOUS

## 2019-04-29 MED ORDER — HEPARIN SODIUM (PORCINE) 1000 UNIT/ML IJ SOLN
INTRAMUSCULAR | Status: DC | PRN
  Administered 2019-04-29: 7000 [IU] via INTRAVENOUS

## 2019-04-29 MED ORDER — CLOPIDOGREL BISULFATE 75 MG PO TABS
75.0000 mg | ORAL_TABLET | Freq: Every day | ORAL | Status: DC
  Administered 2019-04-30: 08:00:00 75 mg via ORAL
  Filled 2019-04-29: qty 1

## 2019-04-29 MED ORDER — FENTANYL CITRATE (PF) 100 MCG/2ML IJ SOLN
INTRAMUSCULAR | Status: DC | PRN
  Administered 2019-04-29: 25 ug via INTRAVENOUS

## 2019-04-29 MED ORDER — GLIPIZIDE ER 10 MG PO TB24
10.0000 mg | ORAL_TABLET | Freq: Every day | ORAL | Status: DC
  Filled 2019-04-29: qty 1

## 2019-04-29 MED ORDER — ATROPINE SULFATE 1 MG/10ML IJ SOSY
PREFILLED_SYRINGE | INTRAMUSCULAR | Status: DC | PRN
  Administered 2019-04-29: 1 mg via INTRAVENOUS

## 2019-04-29 MED ORDER — HEPARIN SODIUM (PORCINE) 1000 UNIT/ML IJ SOLN
INTRAMUSCULAR | Status: AC
  Filled 2019-04-29: qty 1

## 2019-04-29 MED ORDER — SODIUM CHLORIDE 0.9 % IV SOLN: 250.0000 mL | INTRAVENOUS | Status: DC | PRN

## 2019-04-29 MED ORDER — SODIUM CHLORIDE 0.9 % IV SOLN: INTRAVENOUS | Status: DC

## 2019-04-29 MED ORDER — HEPARIN (PORCINE) IN NACL 1000-0.9 UT/500ML-% IV SOLN
INTRAVENOUS | Status: AC
  Filled 2019-04-29: qty 500

## 2019-04-29 MED ORDER — MORPHINE SULFATE (PF) 2 MG/ML IV SOLN: 2.0000 mg | INTRAVENOUS | Status: DC | PRN

## 2019-04-29 MED ORDER — LIDOCAINE HCL (CARDIAC) PF 100 MG/5ML IV SOSY
PREFILLED_SYRINGE | INTRAVENOUS | Status: AC
  Filled 2019-04-29: qty 5

## 2019-04-29 MED ORDER — INSULIN ASPART 100 UNIT/ML ~~LOC~~ SOLN
0.0000 [IU] | Freq: Every day | SUBCUTANEOUS | Status: DC
  Administered 2019-04-29: 4 [IU] via SUBCUTANEOUS

## 2019-04-29 MED ORDER — HEPARIN (PORCINE) IN NACL 1000-0.9 UT/500ML-% IV SOLN
INTRAVENOUS | Status: DC | PRN
  Administered 2019-04-29 (×2): 500 mL

## 2019-04-29 MED ORDER — MIDAZOLAM HCL 2 MG/2ML IJ SOLN
INTRAMUSCULAR | Status: DC | PRN
  Administered 2019-04-29: 1 mg via INTRAVENOUS

## 2019-04-29 MED ORDER — CLOPIDOGREL BISULFATE 300 MG PO TABS
ORAL_TABLET | ORAL | Status: AC
  Filled 2019-04-29: qty 1

## 2019-04-29 MED ORDER — SODIUM CHLORIDE 0.9 % WEIGHT BASED INFUSION: 1.0000 mL/kg/h | INTRAVENOUS | Status: AC

## 2019-04-29 MED ORDER — HYDRALAZINE HCL 20 MG/ML IJ SOLN: 5.0000 mg | INTRAMUSCULAR | Status: DC | PRN

## 2019-04-29 MED ORDER — SODIUM CHLORIDE 0.9% FLUSH: 3.0000 mL | INTRAVENOUS | Status: DC | PRN

## 2019-04-29 MED ORDER — ONDANSETRON HCL 4 MG/2ML IJ SOLN: 4.0000 mg | Freq: Four times a day (QID) | INTRAMUSCULAR | Status: DC | PRN

## 2019-04-29 MED ORDER — CLOPIDOGREL BISULFATE 300 MG PO TABS
ORAL_TABLET | ORAL | Status: DC | PRN
  Administered 2019-04-29: 300 mg via ORAL

## 2019-04-29 MED ORDER — IODIXANOL 320 MG/ML IV SOLN
INTRAVENOUS | Status: DC | PRN
  Administered 2019-04-29: 50 mL via INTRA_ARTERIAL

## 2019-04-29 MED ORDER — CLOPIDOGREL BISULFATE 75 MG PO TABS: 300.0000 mg | ORAL_TABLET | Freq: Once | ORAL | Status: DC

## 2019-04-29 MED ORDER — ASPIRIN EC 81 MG PO TBEC
81.0000 mg | DELAYED_RELEASE_TABLET | Freq: Every day | ORAL | Status: DC
  Administered 2019-04-29 – 2019-04-30 (×2): 81 mg via ORAL
  Filled 2019-04-29 (×2): qty 1

## 2019-04-29 MED ORDER — LABETALOL HCL 5 MG/ML IV SOLN
10.0000 mg | INTRAVENOUS | Status: DC | PRN
  Filled 2019-04-29: qty 4

## 2019-04-29 MED ORDER — SODIUM CHLORIDE 0.9% FLUSH
3.0000 mL | Freq: Two times a day (BID) | INTRAVENOUS | Status: DC
  Administered 2019-04-29: 21:00:00 3 mL via INTRAVENOUS

## 2019-04-29 MED ORDER — LOSARTAN POTASSIUM 50 MG PO TABS
50.0000 mg | ORAL_TABLET | Freq: Every day | ORAL | Status: DC
  Administered 2019-04-30: 50 mg via ORAL
  Filled 2019-04-29: qty 1

## 2019-04-29 MED ORDER — MIDAZOLAM HCL 2 MG/2ML IJ SOLN
INTRAMUSCULAR | Status: AC
  Filled 2019-04-29: qty 2

## 2019-04-29 MED ORDER — LEVOTHYROXINE SODIUM 75 MCG PO TABS
75.0000 ug | ORAL_TABLET | Freq: Every day | ORAL | Status: DC
  Administered 2019-04-30: 07:00:00 75 ug via ORAL
  Filled 2019-04-29: qty 1

## 2019-04-29 MED ORDER — NITROGLYCERIN 0.4 MG SL SUBL: 0.4000 mg | SUBLINGUAL_TABLET | SUBLINGUAL | Status: DC | PRN

## 2019-04-29 MED ORDER — ATROPINE SULFATE 1 MG/10ML IJ SOSY
PREFILLED_SYRINGE | INTRAMUSCULAR | Status: AC
  Filled 2019-04-29: qty 20

## 2019-04-29 MED ORDER — ACETAMINOPHEN 325 MG PO TABS
650.0000 mg | ORAL_TABLET | ORAL | Status: DC | PRN
  Administered 2019-04-29: 650 mg via ORAL
  Filled 2019-04-29: qty 2

## 2019-04-29 MED ORDER — FENTANYL CITRATE (PF) 100 MCG/2ML IJ SOLN
INTRAMUSCULAR | Status: AC
  Filled 2019-04-29: qty 2

## 2019-04-29 MED ORDER — NITROGLYCERIN 0.4 MG SL SUBL
SUBLINGUAL_TABLET | SUBLINGUAL | Status: AC
  Filled 2019-04-29: qty 1

## 2019-04-29 MED ORDER — LABETALOL HCL 5 MG/ML IV SOLN
INTRAVENOUS | Status: AC
  Filled 2019-04-29: qty 4

## 2019-04-29 SURGICAL SUPPLY — 29 items
BALLN MUSTANG 4X150X135 (BALLOONS) ×3
BALLN MUSTANG 5X120X135 (BALLOONS) ×3
BALLOON MUSTANG 4X150X135 (BALLOONS) IMPLANT
BALLOON MUSTANG 5X120X135 (BALLOONS) IMPLANT
CATH OMNI FLUSH 5F 65CM (CATHETERS) ×1 IMPLANT
CATH TEMPO AQUA 5F 100CM (CATHETERS) ×1 IMPLANT
CLOSURE MYNX CONTROL 6F/7F (Vascular Products) ×1 IMPLANT
ELECT DEFIB PAD ADLT CADENCE (PAD) ×1 IMPLANT
FILTER CO2 0.2 MICRON (VASCULAR PRODUCTS) ×1 IMPLANT
GLIDEWIRE ADV .035X260CM (WIRE) ×1 IMPLANT
KIT ENCORE 26 ADVANTAGE (KITS) ×1 IMPLANT
KIT MICROPUNCTURE NIT STIFF (SHEATH) ×1 IMPLANT
KIT PV (KITS) ×3 IMPLANT
RESERVOIR CO2 (VASCULAR PRODUCTS) ×1 IMPLANT
SET FLUSH CO2 (MISCELLANEOUS) ×1 IMPLANT
SHEATH FLEX ANSEL ST 6FR 45CM (SHEATH) ×1 IMPLANT
SHEATH PINNACLE 5F 10CM (SHEATH) ×1 IMPLANT
SHEATH PINNACLE 6F 10CM (SHEATH) ×1 IMPLANT
SHEATH PROBE COVER 6X72 (BAG) ×1 IMPLANT
STENT ELUVIA 6X120X130 (Permanent Stent) ×1 IMPLANT
STENT ELUVIA 6X80X130 (Permanent Stent) ×1 IMPLANT
SYR 10CC CONTROL (SYRINGE) ×1 IMPLANT
SYR MEDRAD MARK V 150ML (SYRINGE) ×1 IMPLANT
TAPE VIPERTRACK RADIOPAQ (MISCELLANEOUS) IMPLANT
TAPE VIPERTRACK RADIOPAQUE (MISCELLANEOUS) ×3
TRANSDUCER W/STOPCOCK (MISCELLANEOUS) ×3 IMPLANT
TRAY PV CATH (CUSTOM PROCEDURE TRAY) ×3 IMPLANT
WIRE BENTSON .035X145CM (WIRE) ×1 IMPLANT
WIRE ROSEN-J .035X180CM (WIRE) ×1 IMPLANT

## 2019-04-29 NOTE — Progress Notes (Signed)
Inpatient Diabetes Program Recommendations  AACE/ADA: New Consensus Statement on Inpatient Glycemic Control  Target Ranges:  Prepandial:   less than 140 mg/dL      Peak postprandial:   less than 180 mg/dL (1-2 hours)      Critically ill patients:  140 - 180 mg/dL   Results for KAORU, BENDA (MRN [id number]) as of 04/29/2019 13:50  Ref. Range 04/29/2019 09:52 04/29/2019 13:10  Glucose-Capillary Latest Ref Range: 70 - 99 mg/dL 183 (H) 234 (H)   Review of Glycemic Control  Diabetes history: DM2 Outpatient Diabetes medications: Trulicity 7.12 mg Qweek (Saturday), Glipizide XL 10 mg QAM, Januvia 100 mg daily, Metformin 500 mg QAM Current orders for Inpatient glycemic control: Glipizide XL 10 mg QAM, Tradjenta 5 mg daily  Inpatient Diabetes Program Recommendations:    Insulin-While inpatient, please consider ordering CBGs AC&HS, Novolog 0-9 units TID with meals and Novolog 0-5 units QHS.  Thanks, Barnie Alderman, RN, MSN, CDE Diabetes Coordinator Inpatient Diabetes Program [phone] (Team Pager from 8am to 5pm)

## 2019-04-29 NOTE — Discharge Instructions (Signed)
   [facility]  Discharge Instructions  Lower Extremity Angiogram; Angioplasty/Stenting  Please refer to the following instructions for your post-procedure care. Your surgeon or physician assistant will discuss any changes with you.  Activity  Avoid lifting more than 8 pounds (1 gallons of milk) for 72 hours (3 days) after your procedure. You may walk as much as you can tolerate. It's OK to drive after 72 hours.  Bathing/Showering  You may shower the day after your procedure. If you have a bandage, you may remove it at 24- 48 hours. Clean your incision site with mild soap and water. Pat the area dry with a clean towel.  Diet  Resume your pre-procedure diet. There are no special food restrictions following this procedure. All patients with peripheral vascular disease should follow a low fat/low cholesterol diet. In order to heal from your surgery, it is CRITICAL to get adequate nutrition. Your body requires vitamins, minerals, and protein. Vegetables are the best source of vitamins and minerals. Vegetables also provide the perfect balance of protein. Processed food has little nutritional value, so try to avoid this.  Medications  Resume taking all of your medications unless your doctor tells you not to. If your incision is causing pain, you may take over-the-counter pain relievers such as acetaminophen (Tylenol)  RESUME YOUR METFORMIN ON 05/02/2019.  DO NOT TAKE BEFORE 05/02/2019 THEN BECAUSE YOU HAD CONTRAST DURING YOUR PROCEDURE  Follow Up  Follow up will be arranged at the time of your procedure. You may have an office visit scheduled or may be scheduled for surgery. Ask your surgeon if you have any questions.  Please call us immediately for any of the following conditions: .Severe or worsening pain your legs or feet at rest or with walking. .Increased pain, redness, drainage at your groin puncture site. .Fever of 101 degrees or higher. .If you have  any mild or slow bleeding from your puncture site: lie down, apply firm constant pressure over the area with a piece of gauze or a clean wash cloth for 30 minutes- no peeking!, call 911 right away if you are still bleeding after 30 minutes, or if the bleeding is heavy and unmanageable.  Reduce your risk factors of vascular disease:  . Stop smoking. If you would like help call [facility] at 1-800-QUIT-NOW [phone]) or [place] at [phone]. . Manage your cholesterol . Maintain a desired weight . Control your diabetes . Keep your blood pressure down .  If you have any questions, please call the office at [phone]

## 2019-04-29 NOTE — Progress Notes (Signed)
79 year old female that underwent right SFA intervention for lower extremity short distance claudication.  During her right SFA angioplasty with stent placement became bradycardic with a heart rate in the 20s and hypotensive with systolics in the 78H.  Very clammy.  Denies chest pain or shortness of breath.  She improved with atropine on the table.  We did place pacing pads.  Will admit for observation overnight after mynx closure of the left groin.  I have consulted cardiology.  Will get EKG as well as troponins and other labs to ensure no further intervention required.  Has hx of moderate AS and previous PCI for coronary disease in the past.  I have updated her husband.  Stable at this time after atropine.  Marty Heck, MD [facility] Office: [place]

## 2019-04-29 NOTE — Plan of Care (Signed)
Continue to monitor

## 2019-04-29 NOTE — Progress Notes (Signed)
Plan of care reviewed. Pt's been progressing. Appeared alert an oriented x 4. Able to ambulate to bathroom with walker and one standby assisted. Pain tolerated well. Left groin dressing intact, dry and clean, negative for hematoma.  Vital signs stable, remained afebrile, sinus rhythm on monitor, HR 80s-90s. BP 131/78 - 156/67 mmHg, SPO2 99% on room air. No immediate medical distress noted. We weill continue to monitor.  Kennyth Lose, RN

## 2019-04-29 NOTE — Consult Note (Addendum)
Cardiology Consultation:   Patient ID: [name] MRN: [id number]; DOB: 04/24/1940  Admit date: 04/29/2019 Date of Consult: 04/29/2019  Primary Care Provider: Lucille Passy, MD Primary Cardiologist: [name], MD  Primary Electrophysiologist:  None    Patient Profile:   [name] is a 79 y.o. female with a hx of CAD status post PCI 2007, hypertension, DM, hyperlipidemia, moderate aortic stenosis, history of breast cancer and PAD who is being seen today for the evaluation of bradycardia/hypotension at the request of Dr. Carlis Abbott.  History of Present Illness:   [name] is a 80 year old female followed by Dr. Johnsie Cancel as an outpatient.  Past medical history noted above.  Underwent cardiac cath in 2007 with PCI/DES to the RCA.  Repeat cath in 2008 showed patent RCA stent with no significant left-sided system disease.  She had breast cancer in 2012 and has gone without recurrence.  Had a Myoview in 2017 with no ischemia and EF noted at 59%.  Echo 2019 showed an EF of 55 to 60% with moderate aortic stenosis.  She was last seen in the office 12/20 with Dr. Johnsie Cancel.  Reported being in her usual state of health, and she was continued on her medications without significant change. Of note patient is statin intolerant, has been on Welchol.  Last echocardiogram was 12/20 showing normal EF with moderate aortic stenosis.   She is followed by Dr. Carlis Abbott as an outpatient for her peripheral vascular disease.  She presented on 04/29/2019 for outpatient aortogram, lower extremity angiogram with possible intervention.  Aortogram showed small infrarenal aorta with moderately diffuse to the left common iliac artery.  Right lower extremity arteriogram showed patent common femoral and profunda.  She did have a diffusely diseased SFA in the mid to distal segment along with a second lesion in the above-knee popliteal artery.  Underwent intervention to the right SFA with balloon angiogram.  Was noted to have a focal  dissection in the mid SFA after angioplasty which was tented.  Have a second lesion in the adductor canal which was stented as well.  After this she became profoundly bradycardic with heart rates in the 93G and a systolic blood pressure of 40.  She was given atropine with quick resolution of her symptoms of nausea.  Her heart rate and blood pressure improved.  Decision was made to stop after the SFA intervention given this bradycardia.  Of note she was loaded with Plavix post procedure.   She was brought up to the floor post procedure.  Review of telemetry notes sinus rhythm since arrival to the floor.  Strips from her procedure are reviewed and note initial rhythm of sinus rhythm with rate in the 60s.  She is noted to become more bradycardic into the 30s and then appears to drop into the 20s with P waves without conducted QRS complexes.  Concerning for AV node dissociation.  P waves on strips appear to marked out appropriately.    In talking with patient she has had no episodes of syncope, near syncope lightheadedness or dizziness prior to admission.  Does report several years ago being told during her cardiac catheterization that she had an episode where she " was going out".  Reports being very sensitive to many medications.  Past Medical History:  Diagnosis Date  . Allergy   . Anemia    childhood  . Anxiety   . Arterial stenosis (HCC)    mesenteric  . Arthritis   . Breast cancer ([place]) 12/05/10  R breast, inv mammary, in situ,ER/PR +,HER2 -  . Cancer ([place])   . Carcinoma of breast treated with adjuvant chemotherapy ([place])   . Coronary artery disease    stent 2007  . Depression   . Diabetes mellitus   . Difficulty sleeping   . Diverticulosis 12/10/03  . Elevated cholesterol   . Generalized weakness   . GERD (gastroesophageal reflux disease)   . Heart murmur    secondary to chem. 2013  . Hepatitis    as an infant  . HSV-1 (herpes simplex virus 1) infection   . Hyperlipidemia   .  Hypertension   . Hypothyroidism   . Osteoarthritis   . Personal history of chemotherapy   . Personal history of radiation therapy   . Pneumonia    2014 september  . 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 Spinal stenosis   . Thyroid disease     Past Surgical History:  Procedure Laterality Date  . ABDOMINAL AORTOGRAM W/LOWER EXTREMITY Bilateral 04/29/2019   Procedure: ABDOMINAL AORTOGRAM W/LOWER EXTREMITY;  Surgeon: Marty Heck, MD;  Location: [facility];  Service: Cardiovascular;  Laterality: Bilateral;  . ANTERIOR AND POSTERIOR REPAIR N/A 04/05/2016   Procedure: ANTERIOR (CYSTOCELE) AND POSTERIOR REPAIR (RECTOCELE);  Surgeon: [name], MD;  Location: [facility];  Service: Gynecology;  Laterality: N/A;  . BREAST BIOPSY    . BREAST LUMPECTOMY  1983   benign biopsy  . BREAST LUMPECTOMY Right 12/29/2010   snbx, ER/PR +, Her2 -, 0/1 node pos.  . CAROTID STENT  2007  . CARPAL TUNNEL RELEASE Bilateral 9/12,8,12  . CHOLECYSTECTOMY    . COLONOSCOPY N/A 02/09/2013   Procedure: COLONOSCOPY;  Surgeon: [name], MD;  Location: [facility];  Service: Endoscopy;  Laterality: N/A;  . COLONOSCOPY    . CORONARY ANGIOPLASTY    . DILATION AND CURETTAGE OF UTERUS    . ESOPHAGOGASTRODUODENOSCOPY N/A 02/09/2013   Procedure: ESOPHAGOGASTRODUODENOSCOPY (EGD);  Surgeon: [name], MD;  Location: [name];  Service: Endoscopy;  Laterality: N/A;  . FOOT SPURS    . HYSTEROSCOPY WITH D & C N/A 09/11/2012   Procedure: DILATATION AND CURETTAGE ;  Surgeon: [name], MD;  Location: [facility];  Service: Gynecology;  Laterality: N/A;  . KNEE SURGERY  2007  . LAPAROSCOPIC ASSISTED VAGINAL HYSTERECTOMY N/A 04/05/2016   Procedure: LAPAROSCOPIC ASSISTED VAGINAL HYSTERECTOMY possible BSO;  Surgeon: [name], MD;  Location: [facility];  Service: Gynecology;  Laterality: N/A;  . LUMBAR LAMINECTOMY/DECOMPRESSION MICRODISCECTOMY N/A 10/20/2014   Procedure: MICRO LUMBER  DECOMPRESSION L3-4 L4-5;  Surgeon: [name], MD;  Location: [facility];  Service: Orthopedics;  Laterality: N/A;  . LUMBAR LAMINECTOMY/DECOMPRESSION MICRODISCECTOMY Bilateral 10/13/2015   Procedure: MICRO LUMBAR DECOMPRESSION L5 - S1 AND REDO DECOMPRESSION L4 - L5 AND REMOVAL OF FACET CYST L4 - L5 2 LEVELS;  Surgeon: [name], MD;  Location: [facility];  Service: Orthopedics;  Laterality: Bilateral;  . PERIPHERAL VASCULAR INTERVENTION Right 04/29/2019   Procedure: PERIPHERAL VASCULAR INTERVENTION;  Surgeon: Marty Heck, MD;  Location: [facility];  Service: Cardiovascular;  Laterality: Right;  . PORT-A-CATH REMOVAL  04/17/2011   Procedure: REMOVAL PORT-A-CATH;  Surgeon: [name], MD;  Location: [facility];  Service: General;  Laterality: Left;  . PORTACATH PLACEMENT  02/07/2011   Procedure: INSERTION PORT-A-CATH;  Surgeon: [name], MD;  Location: [facility];  Service: General;  Laterality: N/A;  . UPPER GASTROINTESTINAL ENDOSCOPY  Home Medications:  Prior to Admission medications   Medication Sig Start Date End Date Taking? Authorizing Provider  acetaminophen (TYLENOL) 500 MG tablet Take 1,000 mg by mouth every 6 (six) hours as needed for moderate pain or headache.   Yes [provider]  aspirin 81 MG tablet Take 1 tablet (81 mg total) by mouth daily. Resume 4 days post-op 10/13/15  Yes Lacie Draft M, PA-C  calcium carbonate (TUMS - DOSED IN MG ELEMENTAL CALCIUM) 500 MG chewable tablet Chew 2 tablets by mouth 3 (three) times daily as needed for indigestion or heartburn.   Yes [provider]  chlorthalidone (HYGROTON) 25 MG tablet TAKE 1/2 TABLET(12.5 MG) BY MOUTH EVERY DAY FOR 3 DAYS THEN TAKE 1 TABLET(25 MG) BY MOUTH EVERY DAY Patient taking differently: Take 25 mg by mouth daily as needed (high blood pressure).  08/26/18  Yes Josue Hector, MD  Cholecalciferol (VITAMIN D) 2000 units CAPS Take 2,000 Units by mouth daily.     Yes [provider]  clobetasol cream (TEMOVATE) 0.07 % Apply 1 application topically 2 (two) times daily. Patient taking differently: Apply 1 application topically 2 (two) times daily as needed (itching).  07/21/18  Yes Lucille Passy, MD  Dulaglutide (TRULICITY) 6.22 QJ/3.3LK SOPN Inject 0.75 mg into the skin once a week. Patient taking differently: Inject 0.75 mg into the skin every Saturday.  03/10/19  Yes Libby Maw, MD  glipiZIDE (GLUCOTROL XL) 10 MG 24 hr tablet Take 1 tablet (10 mg total) by mouth daily with breakfast. 01/07/19  Yes Lucille Passy, MD  JANUVIA 100 MG tablet TAKE 1 TABLET BY MOUTH  DAILY Patient taking differently: Take 100 mg by mouth daily.  01/02/19  Yes Lucille Passy, MD  levothyroxine (SYNTHROID) 75 MCG tablet TAKE 1 TABLET BY MOUTH ONCE DAILY AT 6 AM Patient taking differently: Take 75 mcg by mouth daily before breakfast.  12/15/18  Yes Lucille Passy, MD  losartan (COZAAR) 50 MG tablet TAKE 1 TABLET BY MOUTH  DAILY Patient taking differently: Take 50 mg by mouth daily.  01/02/19  Yes Lucille Passy, MD  metFORMIN (GLUCOPHAGE) 500 MG tablet Take 1 tablet (500 mg total) by mouth daily with breakfast. 01/02/19  Yes Lucille Passy, MD  pantoprazole (PROTONIX) 40 MG tablet TAKE 1 TABLET BY MOUTH  DAILY Patient taking differently: Take 40 mg by mouth daily.  10/21/18  Yes Lucille Passy, MD  PARoxetine (PAXIL) 20 MG tablet TAKE 1 TABLET BY MOUTH EVERY DAY IN THE MORNING Patient taking differently: Take 20 mg by mouth daily.  01/02/19  Yes Lucille Passy, MD  vitamin B-12 (CYANOCOBALAMIN) 500 MCG tablet Take 500 mcg by mouth daily.   Yes [provider]  ferrous sulfate (FERROUSUL) 325 (65 FE) MG tablet Take 1 tablet (325 mg total) by mouth daily with breakfast. Patient not taking: Reported on 04/22/2019 11/13/17   Lucille Passy, MD  glucose blood (ACCU-CHEK GUIDE) test strip TEST ONCE DAILY 01/07/19   Lucille Passy, MD  Lancet Device MISC 100 each by  Does not apply route daily. UAD for daily glucose monitoring 03/04/17   Lucille Passy, MD  Lancets Misc. (ACCU-CHEK MULTICLIX LANCET DEV) KIT Use as directed to check sugars 05/04/15   [provider]  nitroGLYCERIN (NITROSTAT) 0.4 MG SL tablet Place 1 tablet (0.4 mg total) under the tongue every 5 (five) minutes as needed for chest pain. 07/02/13   Josue Hector, MD  valACYclovir (VALTREX) 1000 MG tablet TAKE 1 TABLET BY MOUTH 2  TIMES DAILY AS NEEDED FOR  VIRAL INFECTION. Patient taking differently: Take 1,000 mg by mouth 2 (two) times daily as needed (fever blisters).  04/22/17   Lucille Passy, MD    Inpatient Medications: Scheduled Meds: . aspirin EC  81 mg Oral Daily  . clopidogrel  300 mg Oral Once   Followed by  . [START ON 04/30/2019] clopidogrel  75 mg Oral Q breakfast  . [START ON 04/30/2019] glipiZIDE  10 mg Oral Q breakfast  . insulin aspart  0-5 Units Subcutaneous QHS  . insulin aspart  0-9 Units Subcutaneous TID WC  . [START ON 04/30/2019] levothyroxine  75 mcg Oral QAC breakfast  . linagliptin  5 mg Oral Daily  . [START ON 04/30/2019] losartan  50 mg Oral Daily  . sodium chloride flush  3 mL Intravenous Q12H   Continuous Infusions: . sodium chloride    . sodium chloride 1 mL/kg/hr (04/29/19 1018)   PRN Meds: sodium chloride, acetaminophen, hydrALAZINE, labetalol, ondansetron (ZOFRAN) IV, sodium chloride flush  Allergies:    Allergies  Allergen Reactions  . Codeine Nausea And Vomiting and Other (See Comments)    Severe stomach cramps  . Antihistamines, Diphenhydramine-Type Other (See Comments)    Causes hyperactivity  . Gabapentin     Urinary incontinence  . Statins Other (See Comments)    Joint pains  . Sulfa Antibiotics     Unknown to pt  . Erythromycin Hives and Rash    Due to dental work about 50 years ago. Was used in packing and resulted in rash/hives inside and outside of mouth.    Social History:   Social History   Socioeconomic History  . Marital  status: Married    Spouse name: Not on file  . Number of children: 2  . Years of education: Not on file  . Highest education level: Not on file  Occupational History  . Occupation: retired  Tobacco Use  . Smoking status: Former Smoker    Packs/day: 2.00    Years: 40.00    Pack years: 80.00    Quit date: 12/19/1997    Years since quitting: 21.3  . Smokeless tobacco: Never Used  Substance and Sexual Activity  . Alcohol use: Yes    Alcohol/week: 6.0 standard drinks    Types: 6 Glasses of wine per week    Comment: occasional  . Drug use: No  . Sexual activity: Not on file    Comment: menarch age 62, P3, HRT X 10 YRS, MENOPAUSE MID 40'S  Other Topics Concern  . Not on file  Social History Narrative   [name] age 89- [place] age 53  [place]   Social Determinants of Health   Financial Resource Strain:   . Difficulty of Paying Living Expenses:   Food Insecurity:   . Worried About Charity fundraiser in the Last Year:   . Arboriculturist in the Last Year:   Transportation Needs:   . Film/video editor (Medical):   Marland Kitchen Lack of Transportation (Non-Medical):   Physical Activity:   . Days of Exercise per Week:   . Minutes of Exercise per Session:   Stress:   . Feeling of Stress :   Social Connections:   . Frequency of Communication with Friends and Family:   . Frequency of Social Gatherings with Friends and Family:   . Attends Religious Services:   . Active  Member of Clubs or Organizations:   . Attends Archivist Meetings:   Marland Kitchen Marital Status:   Intimate Partner Violence:   . Fear of Current or Ex-Partner:   . Emotionally Abused:   Marland Kitchen Physically Abused:   . Sexually Abused:     Family History:    Family History  Problem Relation Age of Onset  . Throat cancer Father   . Alcoholism Father   . Heart attack Father   . Heart disease Mother   . Lymphoma Brother 98  . Cancer Son   . Heart attack Brother   . Diabetes Brother   . Hypertension  Brother   . Colon cancer Neg Hx   . Stroke Neg Hx   . Esophageal cancer Neg Hx   . Rectal cancer Neg Hx   . Stomach cancer Neg Hx      ROS:  Please see the history of present illness.   All other ROS reviewed and negative.     Physical Exam/Data:   Vitals:   04/29/19 1215 04/29/19 1230 04/29/19 1300 04/29/19 1457  BP: 100/63 114/70 (!) 159/72 131/78  Pulse: 94 96 89 81  Resp: (!) '23 19 16 18  ' Temp:   97.9 F (36.6 C)   TempSrc:   Oral   SpO2: 100% 98% 98% 94%  Weight:      Height:       No intake or output data in the 24 hours ending 04/29/19 1515 Last 3 Weights 04/29/2019 04/21/2019 01/01/2019  Weight (lbs) 145 lb 147 lb 147 lb  Weight (kg) 65.772 kg 66.679 kg 66.679 kg     Body mass index is 25.69 kg/m.  General:  Well nourished, well developed, in no acute distress HEENT: normal Lymph: no adenopathy Neck: no JVD Endocrine:  No thryomegaly Vascular: No carotid bruits Cardiac:  normal S1, S2; RRR; + murmur  Lungs:  clear to auscultation bilaterally, no wheezing, rhonchi or rales  Abd: soft, nontender, no hepatomegaly  Ext: no edema Musculoskeletal:  No deformities, BUE and BLE strength normal and equal.  Skin: warm and dry  Neuro:  CNs 2-12 intact, no focal abnormalities noted Psych:  Normal affect   EKG:  The EKG was personally reviewed and demonstrates: Sinus rhythm nonspecific T wave abnormality  Relevant CV Studies:  Echo: 12/20  IMPRESSIONS    1. Left ventricular ejection fraction, by visual estimation, is 60 to  65%. The left ventricle has normal function. There is severely increased  left ventricular hypertrophy.  2. Left ventricular diastolic parameters are indeterminate.  3. Global right ventricle has normal systolic function.The right  ventricular size is normal. No increase in right ventricular wall  thickness.  4. Left atrial size was normal.  5. Right atrial size was normal.  6. The aortic valve is abnormal. Moderately calcified.  Aortic valve  regurgitation is not visualized. Moderate aortic valve stenosis. Vmax 3.0  m/s, MG 25 mmHg, AVA 1.1 cm^2, DI 0.3  7. The mitral valve is normal in structure. Mild mitral valve  regurgitation.  8. The tricuspid valve is normal in structure. Tricuspid valve  regurgitation is not demonstrated.  9. The pulmonic valve was not well visualized. Pulmonic valve  regurgitation is not visualized.  10. The inferior vena cava is normal in size with greater than 50%  respiratory variability, suggesting right atrial pressure of 3 mmHg.  11. TR signal is inadequate for assessing pulmonary artery systolic  pressure.   Laboratory Data:  High Sensitivity Troponin:  Recent Labs  Lab 04/29/19 1002 04/29/19 1315  TROPONINIHS 19* 20*     Chemistry Recent Labs  Lab 04/29/19 0650 04/29/19 1002  NA 137 137  K 4.4 4.1  CL 103 105  CO2  --  21*  GLUCOSE 178* 199*  BUN 34* 29*  CREATININE 1.80* 1.46*  CALCIUM  --  8.6*  GFRNONAA  --  35*  GFRAA  --  40*  ANIONGAP  --  11    Recent Labs  Lab 04/29/19 1002  PROT 5.7*  ALBUMIN 2.9*  AST 23  ALT 18  ALKPHOS 58  BILITOT 0.5   Hematology Recent Labs  Lab 04/29/19 0650 04/29/19 1002  WBC  --  5.9  RBC  --  4.47  HGB 13.6 13.9  HCT 40.0 41.6  MCV  --  93.1  MCH  --  31.1  MCHC  --  33.4  RDW  --  13.7  PLT  --  206   BNPNo results for input(s): BNP, PROBNP in the last 168 hours.  DDimer No results for input(s): DDIMER in the last 168 hours.   Radiology/Studies:  PERIPHERAL VASCULAR CATHETERIZATION  Result Date: 04/29/2019 Patient name: [name]         MRN: [id number]        DOB: 09/20/1940        Sex: female  04/29/2019 Pre-operative Diagnosis: Bilateral lower extremity short distance lifestyle limiting claudication Post-operative diagnosis:  Same Surgeon:  Marty Heck, MD Procedure Performed: 1.  Ultrasound-guided access of the left common femoral artery 2.  CO2 aortogram 3.  Right lower extremity  arteriogram with selection of third order branches with CO2 and limited contrast 4.  Right SFA angioplasty with stent placement (predilated with a 4 mm Mustang, stented with 6 mm x 120 mm drug-coated Eluvia and 6 mm x 80 mm drug-coated Eluvia postdilated with a 5 mm Mustang) 5.  Mynx closure of the left common femoral artery 6.  68 minutes of monitored moderate conscious sedation time  Indications: 79 year old female that presented with bilateral lower extremity short distance lifestyle limiting claudication.  Ultimately she presents today for planned arteriogram with lower extremity runoff.  Pre-operative imaging suggested multi-level disease in SFA and tibials. We planned to work on the right leg first given more symtpomatic.  Risks and benefits have been discussed in detail.  Findings:  CO2 aortogram showed a very small infrarenal aorta with moderately diseased left common iliac but she has a great left femoral pulse and this does not appear flow-limiting.  Right lower extremity arteriogram shows a patent common femoral and profunda.  She has a diffusely diseased SFA in the mid to distal segment over about 150 mm with multiple focal greater than 80% stenosis.  She has a second lesion in the above-knee popliteal artery with about a 50% stenosis.  She has severe tibial disease with dominant anterior tibial runoff with multiple areas of high-grade stenosis in the proximal to mid segment and a peroneal that occludes in the mid to distal calf.  Posterior tibial is occluded throughout its course.  Dominant flow into the foot is via the anterior tibial with filling of the dorsalis pedis (the anterior tibial artery looks much healthier distally in the calf).  Ultimately the lesion was crossed in the right SFA and this was predilated with a 4 mm Mustang balloon.  We had a focal dissection in the mid SFA after angioplasty.  We elected to primarily stent this with  a 6 mm x 120 mm Eluvia and then postdilated this with a  5 mm Mustang balloon.  There was a second lesion at adductor canal just distal to our stent and then we treated with a second 6 mm x 80 mm Eluvia that was again post-dilated to 5 mm.   Ultimately she became bradycardic on the table dropped her heart rate to 09N with a systolic blood pressure of 40.  We gave her atropine and ultimately her HR and BP came up.  We elected to stop after SFA intervention given her clinical condition and the fact that she only has claudication.  She will be admitted for serial labs, cardiac enzymes, and I will consult cardiology.             Procedure:  The patient was identified in the holding area and taken to room 8.  The patient was then placed supine on the table and prepped and draped in the usual sterile fashion.  A time out was called.  Ultrasound was used to evaluate the left common femoral artery.  It was patent .  A digital ultrasound image was acquired.  A micropuncture needle was used to access the left common femoral artery under ultrasound guidance.  An 018 wire was advanced without resistance and a micropuncture sheath was placed.  The 018 wire was removed and a benson wire was placed.  The micropuncture sheath was exchanged for a 5 french sheath.  An omniflush catheter was advanced over the wire to the level of L-1.  An abdominal angiogram was obtained with CO2.  Next, using the omniflush catheter was pulled down and bilateral iliac images were obtained with CO2. I then used a benson wire and the aortic bifurcation was crossed and the catheter was placed into theright external iliac artery and right runoff was obtained.  We did have to use a long 100 cm straight flush catheter to get better pictures of the tibials.  We used a radioopaque ruler marker on right leg to limit contrast.  Ultimately we initially planned to treat the SFA given she only has claudication.  We used a long Glidewire advantage down the right SFA with straight flush catheter to cross all the lesions  into the below knee popliteal artery.  Then exchanged for a long 6 [place] Ansell sheath in the left common femoral artery over the aortic bifurcation.  Patient was given 100 units/kg heparin.  We initially predilated this lesion in the SFA with a 4 x 150 mm Mustang.  On another hand-injection there was a focal dissection in the mid SFA.  I elected to primarily stent this and used a 6 mm x 120 mm ELuvia postdilated with a 5 mm Mustang and then a second 6 mm x 80 mm Eluvia given a lesion just distal to our first stent in the adductor canal.  Again the second stent was postdilated with a 5 mm Mustang.  She became bradycardic with a heart rate in the 30s and dropped her systolic to 40.  She was given atropine.  We put pacing pads on her.  Ultimately we elected to stop and I am hopeful by treating her SFA given only claudication this will resolve her symptoms.  I did not shoot the runoff of her left leg given her clinical deterioration.  Mynx closure was placed in the left common femoral artery after a short 6 French sheath was placed.  Should be taken to PACU and admitted for observation and serial  labs.  I will consult cardiology.  Plan: I will admit her overnight for serial labs given bradycardia and hypotension during the case.  Cardiology has also been consulted She was loaded on Plavix for right SFA stents.  I will plan to follow-up with her and see how her right leg improves in one month before intervening on the left pending her clinical condition.  She has intolerance to statins.  Marty Heck, MD [facility] Office: [phone]   Assessment and Plan:   [name] is a 79 y.o. female with a hx of CAD status post PCI 2007, hypertension, DM, hyperlipidemia, moderate aortic stenosis, history of breast cancer and PAD who is being seen today for the evaluation of bradycardia/hypotension at the request of Dr. Carlis Abbott.  1.  Bradycardia: Noted to become significantly  bradycardic during her peripheral vascular procedure today.  Heart rate coordinated in the 30s along with hypotension.  Given atropine with heart rate and blood pressure responding appropriately.  She did feel nauseated during this episode.  In review of strips suspect this could be vagally mediated but strips do appear to have AV node dissociation.  This has resolved quickly, and she has been in sinus rhythm since arriving to the floor. She is not on any AV nodal blocking agents prior to admission. --Monitor on telemetry overnight --consider EP consult in the am --Suspect will need outpatient monitor at discharge  2.  PAD: Underwent successful intervention to right SFA with Dr. Carlis Abbott today.  Was loaded with Plavix post procedure.  3.  Moderate aortic stenosis: On echo back in December 2020.  Does have murmur noted on exam. - consider echo  4.  CAD: Underwent PCI of the RCA in 2007.  Had a stress test which was normal in 2017.  Denies any chest pain prior to or during admission. hsTn 19>>20 low and flat. Continue with risk reduction management.  5.  Hyperlipidemia: Noted to be statin intolerant, has been on WelChol as an outpatient.  May be a candidate for PCSK9s  For questions or updates, please contact [place] Please consult [email or web address] for contact info under   Signed, Reino Bellis, NP  04/29/2019 3:15 PM  Patient seen and examined with Reino Bellis, NP.  Agree as above, with the following exceptions and changes as noted below. A 79 yo female with hx of PAD, moderate aortic stenosis, CAD with PCI to RCA, HLD, found to have procedural bradycardia and hypotension during Right SFA angioplasty with stent placement (predilated with a 4 mm Mustang, stented with 6 mm x 120 mm drug-coated Eluvia and 6 mm x 80 mm drug-coated Eluvia postdilated with a 5 mm Mustang.   Gen: NAD, CV: RRR, 3/6 mid to late peaking SEM with preserved S2, Lungs: clear, Abd: soft, Extrem: Warm, no edema,  Neuro/Psych: alert and oriented x 3, normal mood and affect. All available labs, radiology testing, previous records reviewed.   I have reviewed the telemetry strips from the procedure. There is a period of profound bradycardia with what appears to be a junctional escape rhythm and no definite atrial activity, subsequently there appears to be unusual appearing P waves that march out at a rate of 100 bpm, with AV dissociation and intermittent capture. This is concerning for transient high grade AV block. While this is likely vagally mediated from sedation and procedural factors, it is concerning for native conduction system disease. She is back to baseline, sinus rhythm now and appears well. This  rhythm disturbance responded to atropine.   For completeness due to concern of high grade AV block, we will review this with our colleagues in [place] tomorrow. Echocardiogram was performed in 12/2018 with no interval structural changes compared to one year prior and stable moderate AS. We can consider an echo tomorrow, but it is unlikely to be revealing for new findings in such a short interval.    [name] 04/29/19 6:01 PM

## 2019-04-29 NOTE — Progress Notes (Signed)
Patient arrived to 4E room 14 at this time from PACU. Telemetry applied and CCMD notified. V/s and assessment complete. CHG bath. Patient oriented to room and how to call nurse with any needs. Call bell in reach.

## 2019-04-29 NOTE — Op Note (Signed)
Patient name: [name] MRN: [id number] DOB: 04/04/1940 Sex: female  04/29/2019 Pre-operative Diagnosis: Bilateral lower extremity short distance lifestyle limiting claudication Post-operative diagnosis:  Same Surgeon:  Marty Heck, MD Procedure Performed: 1.  Ultrasound-guided access of the left common femoral artery 2.  CO2 aortogram 3.  Right lower extremity arteriogram with selection of third order branches with CO2 and limited contrast 4.  Right SFA angioplasty with stent placement (predilated with a 4 mm Mustang, stented with 6 mm x 120 mm drug-coated Eluvia and 6 mm x 80 mm drug-coated Eluvia postdilated with a 5 mm Mustang) 5.  Mynx closure of the left common femoral artery 6.  68 minutes of monitored moderate conscious sedation time  Indications: 79 year old female that presented with bilateral lower extremity short distance lifestyle limiting claudication.  Ultimately she presents today for planned arteriogram with lower extremity runoff.  Pre-operative imaging suggested multi-level disease in SFA and tibials. We planned to work on the right leg first given more symtpomatic.  Risks and benefits have been discussed in detail.  Findings:   CO2 aortogram showed a very small infrarenal aorta with moderately diseased left common iliac but she has a great left femoral pulse and this does not appear flow-limiting.  Right lower extremity arteriogram shows a patent common femoral and profunda.  She has a diffusely diseased SFA in the mid to distal segment over about 150 mm with multiple focal greater than 80% stenosis.  She has a second lesion in the above-knee popliteal artery with about a 50% stenosis.  She has severe tibial disease with dominant anterior tibial runoff with multiple areas of high-grade stenosis in the proximal to mid segment and a peroneal that occludes in the mid to distal calf.  Posterior tibial is occluded throughout its course.  Dominant flow into the  foot is via the anterior tibial with filling of the dorsalis pedis (the anterior tibial artery looks much healthier distally in the calf).  Ultimately the lesion was crossed in the right SFA and this was predilated with a 4 mm Mustang balloon.  We had a focal dissection in the mid SFA after angioplasty.  We elected to primarily stent this with a 6 mm x 120 mm Eluvia and then postdilated this with a 5 mm Mustang balloon.  There was a second lesion at adductor canal just distal to our stent and then we treated with a second 6 mm x 80 mm Eluvia that was again post-dilated to 5 mm.    Ultimately she became bradycardic on the table dropped her heart rate to 46T with a systolic blood pressure of 40.  We gave her atropine and ultimately her HR and BP came up.  We elected to stop after SFA intervention given her clinical condition and the fact that she only has claudication.  She will be admitted for serial labs, cardiac enzymes, and I will consult cardiology.   Procedure:  The patient was identified in the holding area and taken to room 8.  The patient was then placed supine on the table and prepped and draped in the usual sterile fashion.  A time out was called.  Ultrasound was used to evaluate the left common femoral artery.  It was patent .  A digital ultrasound image was acquired.  A micropuncture needle was used to access the left common femoral artery under ultrasound guidance.  An 018 wire was advanced without resistance and a micropuncture sheath was placed.  The 018 wire was  removed and a benson wire was placed.  The micropuncture sheath was exchanged for a 5 french sheath.  An omniflush catheter was advanced over the wire to the level of L-1.  An abdominal angiogram was obtained with CO2.  Next, using the omniflush catheter was pulled down and bilateral iliac images were obtained with CO2. I then used a benson wire and the aortic bifurcation was crossed and the catheter was placed into theright external  iliac artery and right runoff was obtained.  We did have to use a long 100 cm straight flush catheter to get better pictures of the tibials.  We used a radioopaque ruler marker on right leg to limit contrast.  Ultimately we initially planned to treat the SFA given she only has claudication.  We used a long Glidewire advantage down the right SFA with straight flush catheter to cross all the lesions into the below knee popliteal artery.  Then exchanged for a long 6 [place] Ansell sheath in the left common femoral artery over the aortic bifurcation.  Patient was given 100 units/kg heparin.  We initially predilated this lesion in the SFA with a 4 x 150 mm Mustang.  On another hand-injection there was a focal dissection in the mid SFA.  I elected to primarily stent this and used a 6 mm x 120 mm ELuvia postdilated with a 5 mm Mustang and then a second 6 mm x 80 mm Eluvia given a lesion just distal to our first stent in the adductor canal.  Again the second stent was postdilated with a 5 mm Mustang.  She became bradycardic with a heart rate in the 30s and dropped her systolic to 40.  She was given atropine.  We put pacing pads on her.  Ultimately we elected to stop and I am hopeful by treating her SFA given only claudication this will resolve her symptoms.  I did not shoot the runoff of her left leg given her clinical deterioration.  Mynx closure was placed in the left common femoral artery after a short 6 French sheath was placed.  Should be taken to PACU and admitted for observation and serial labs.  I will consult cardiology.  Plan: I will admit her overnight for serial labs given bradycardia and hypotension during the case.  Cardiology has also been consulted She was loaded on Plavix for right SFA stents.  I will plan to follow-up with her and see how her right leg improves in one month before intervening on the left pending her clinical condition.  She has intolerance to statins.  Marty Heck, MD [facility] Office: [phone]

## 2019-04-29 NOTE — H&P (Signed)
History and Physical Interval Note:  04/29/2019 8:21 AM  Vicki Mallet  has presented today for surgery, with the diagnosis of PAD.  The various methods of treatment have been discussed with the patient and family. After consideration of risks, benefits and other options for treatment, the patient has consented to  Procedure(s): ABDOMINAL AORTOGRAM W/LOWER EXTREMITY (Bilateral) as a surgical intervention.  The patient's history has been reviewed, patient examined, no change in status, stable for surgery.  I have reviewed the patient's chart and labs.  Questions were answered to the patient's satisfaction.    Aortogram, bilateral arteriogram, short distance lifetsyle limiting claudication  Marty Heck  Patient name: [name] MRN: [id number] DOB: 01-04-1941 Sex: female  REASON FOR CONSULT: Evaluate PAD and lower extremity claudication  HPI:  TIAH HECKEL is a 79 y.o. female, with history of hypertension, hyperlipidemia, diabetes, coronary artery disease status post remote PCI with stent, breast cancer that presents for evaluation of PAD and bilateral lower extremity claudication. Patient states she has had pain in the right thigh and the left calf when walking that started about 3 to 4 months ago. This has gotten particularly more severe and states she can only walk about 10 to 15 feet before she has to stop. She went to the grocery store earlier this week and states she had to almost lay on the floor on 3-4 different occasions. She denies any previous lower extremity interventions or bypasses. She does have a coronary stent. Denies any active tobacco abuse at this time and states she quit smoking years ago. She is very debilitated and cannot even work in her garden. She was being seen by Dr. Tonita Cong and he obtained some arterial vascular studies and sent her over for further evaluation. Her ABIs were 0.61 on the right and 0.60 on the left and monophasic at both ankles.      Past  Medical History:  Diagnosis Date  . Allergy   . Anemia    childhood  . Anxiety   . Arterial stenosis (HCC)    mesenteric  . Arthritis   . Breast cancer ([facility]) 12/05/10   R breast, inv mammary, in situ,ER/PR +,HER2 -  . Cancer ([facility])   . Carcinoma of breast treated with adjuvant chemotherapy ([facility])   . Coronary artery disease    stent 2007  . Depression   . Diabetes mellitus   . Difficulty sleeping   . Diverticulosis 12/10/03  . Elevated cholesterol   . Generalized weakness   . GERD (gastroesophageal reflux disease)   . Heart murmur    secondary to chem. 2013  . Hepatitis    as an infant  . HSV-1 (herpes simplex virus 1) infection   . Hyperlipidemia   . Hypertension   . Hypothyroidism   . Osteoarthritis   . Personal history of chemotherapy   . Personal history of radiation therapy   . Pneumonia    2014 september  . 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 Spinal stenosis   . Thyroid disease         Past Surgical History:  Procedure Laterality Date  . ANTERIOR AND POSTERIOR REPAIR N/A 04/05/2016   Procedure: ANTERIOR (CYSTOCELE) AND POSTERIOR REPAIR (RECTOCELE); Surgeon: [name], MD; Location: [facility]; Service: Gynecology; Laterality: N/A;  . BREAST BIOPSY    . BREAST LUMPECTOMY  1983   benign biopsy  . BREAST LUMPECTOMY Right 12/29/2010   snbx, ER/PR +, Her2 -, 0/1  node pos.  . CAROTID STENT  2007  . CARPAL TUNNEL RELEASE Bilateral 9/12,8,12  . CHOLECYSTECTOMY    . COLONOSCOPY N/A 02/09/2013   Procedure: COLONOSCOPY; Surgeon: [name], MD; Location: [facility]; Service: Endoscopy; Laterality: N/A;  . COLONOSCOPY    . CORONARY ANGIOPLASTY    . DILATION AND CURETTAGE OF UTERUS    . ESOPHAGOGASTRODUODENOSCOPY N/A 02/09/2013   Procedure: ESOPHAGOGASTRODUODENOSCOPY (EGD); Surgeon: [name], MD; Location: [name]; Service: Endoscopy; Laterality: N/A;  . FOOT SPURS    . HYSTEROSCOPY WITH D & C N/A 09/11/2012   Procedure: DILATATION  AND CURETTAGE ; Surgeon: [name], MD; Location: [facility]; Service: Gynecology; Laterality: N/A;  . KNEE SURGERY  2007  . LAPAROSCOPIC ASSISTED VAGINAL HYSTERECTOMY N/A 04/05/2016   Procedure: LAPAROSCOPIC ASSISTED VAGINAL HYSTERECTOMY possible BSO; Surgeon: [name], MD; Location: [facility]; Service: Gynecology; Laterality: N/A;  . LUMBAR LAMINECTOMY/DECOMPRESSION MICRODISCECTOMY N/A 10/20/2014   Procedure: MICRO LUMBER DECOMPRESSION L3-4 L4-5; Surgeon: [name], MD; Location: [facility]; Service: Orthopedics; Laterality: N/A;  . LUMBAR LAMINECTOMY/DECOMPRESSION MICRODISCECTOMY Bilateral 10/13/2015   Procedure: MICRO LUMBAR DECOMPRESSION L5 - S1 AND REDO DECOMPRESSION L4 - L5 AND REMOVAL OF FACET CYST L4 - L5 2 LEVELS; Surgeon: [name], MD; Location: [facility]; Service: Orthopedics; Laterality: Bilateral;  . PORT-A-CATH REMOVAL  04/17/2011   Procedure: REMOVAL PORT-A-CATH; Surgeon: [name], MD; Location: [facility]; Service: General; Laterality: Left;  . PORTACATH PLACEMENT  02/07/2011   Procedure: INSERTION PORT-A-CATH; Surgeon: [name], MD; Location: [facility]; Service: General; Laterality: N/A;  . UPPER GASTROINTESTINAL ENDOSCOPY          Family History  Problem Relation Age of Onset  . Throat cancer Father   . Alcoholism Father   . Heart attack Father   . Heart disease Mother   . Lymphoma Brother 24  . Cancer Son   . Heart attack Brother   . Diabetes Brother   . Hypertension Brother   . Colon cancer Neg Hx   . Stroke Neg Hx   . Esophageal cancer Neg Hx   . Rectal cancer Neg Hx   . Stomach cancer Neg Hx    SOCIAL HISTORY:  Social History        Socioeconomic History  . Marital status: Married    Spouse name: Not on file  . Number of children: 2  . Years of education: Not on file  . Highest education level: Not on file  Occupational History  . Occupation: retired  Tobacco Use  . Smoking status: Former Smoker    Packs/day: 2.00     Years: 40.00    Pack years: 80.00    Quit date: 12/19/1997    Years since quitting: 21.3  . Smokeless tobacco: Never Used  Substance and Sexual Activity  . Alcohol use: Yes    Alcohol/week: 6.0 standard drinks    Types: 6 Glasses of wine per week    Comment: occasional  . Drug use: No  . 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 Not on file  Social History Narrative   [name] age 69- [place] age 57 [place]   Social Determinants of Health      Financial Resource Strain:   . Difficulty of Paying Living Expenses:   Food Insecurity:   . Worried About Charity fundraiser in the Last Year:   .  Ran Out of Food in the Last Year:   Transportation Needs:   . Film/video editor (Medical):   Marland Kitchen Lack of Transportation (Non-Medical):   Physical Activity:   . Days of Exercise per Week:   . Minutes of Exercise per Session:   Stress:   . Feeling of Stress :   Social Connections:   . Frequency of Communication with Friends and Family:   . Frequency of Social Gatherings with Friends and Family:   . Attends Religious Services:   . Active Member of Clubs or Organizations:   . Attends Archivist Meetings:   Marland Kitchen Marital Status:   Intimate Partner Violence:   . Fear of Current or Ex-Partner:   . Emotionally Abused:   Marland Kitchen Physically Abused:   . Sexually Abused:         Allergies  Allergen Reactions  . Codeine Nausea And Vomiting and Other (See Comments)    Severe stomach cramps  . Antihistamines, Diphenhydramine-Type Other (See Comments)    Causes hyperactivity  . Gabapentin     Urinary incontinence  . Statins Other (See Comments)    Joint pains  . Sulfa Antibiotics   . Erythromycin Hives and Rash    Due to dental work about 50 years ago. Was used in packing and resulted in rash/hives inside and outside of mouth.         Current Outpatient Medications  Medication Sig  Dispense Refill  . acetaminophen (TYLENOL) 325 MG tablet Take 650 mg by mouth every 6 (six) hours as needed.    Marland Kitchen aspirin 81 MG tablet Take 1 tablet (81 mg total) by mouth daily. Resume 4 days post-op    . Cholecalciferol (VITAMIN D) 2000 units CAPS Take 1 capsule by mouth daily.    . clobetasol cream (TEMOVATE) 2.33 % Apply 1 application topically 2 (two) times daily. 30 g 0  . Dulaglutide (TRULICITY) 0.07 MA/2.6JF SOPN Inject 0.75 mg into the skin once a week. 1 pen 3  . ferrous sulfate (FERROUSUL) 325 (65 FE) MG tablet Take 1 tablet (325 mg total) by mouth daily with breakfast. 30 tablet 3  . glipiZIDE (GLUCOTROL XL) 10 MG 24 hr tablet Take 1 tablet (10 mg total) by mouth daily with breakfast. 90 tablet 3  . glucose blood (ACCU-CHEK GUIDE) test strip TEST ONCE DAILY 100 each 5  . JANUVIA 100 MG tablet TAKE 1 TABLET BY MOUTH DAILY 90 tablet 3  . Lancet Device MISC 100 each by Does not apply route daily. UAD for daily glucose monitoring 100 each PRN  . Lancets Misc. (ACCU-CHEK MULTICLIX LANCET DEV) KIT Use as directed to check sugars    . levothyroxine (SYNTHROID) 75 MCG tablet TAKE 1 TABLET BY MOUTH ONCE DAILY AT 6 AM 90 tablet 1  . losartan (COZAAR) 50 MG tablet TAKE 1 TABLET BY MOUTH DAILY 90 tablet 3  . metFORMIN (GLUCOPHAGE) 500 MG tablet Take 1 tablet (500 mg total) by mouth daily with breakfast. 90 tablet 3  . nitroGLYCERIN (NITROSTAT) 0.4 MG SL tablet Place 1 tablet (0.4 mg total) under the tongue every 5 (five) minutes as needed for chest pain. 25 tablet 3  . pantoprazole (PROTONIX) 40 MG tablet TAKE 1 TABLET BY MOUTH DAILY 90 tablet 1  . PARoxetine (PAXIL) 20 MG tablet TAKE 1 TABLET BY MOUTH EVERY DAY IN THE MORNING 90 tablet 1  . valACYclovir (VALTREX) 1000 MG tablet TAKE 1 TABLET BY MOUTH 2 TIMES DAILY AS NEEDED FOR VIRAL INFECTION.  180 tablet 0  . vitamin B-12 (CYANOCOBALAMIN) 500 MCG tablet Take 500 mcg by mouth daily.    . chlorthalidone (HYGROTON) 25 MG tablet TAKE 1/2  TABLET(12.5 MG) BY MOUTH EVERY DAY FOR 3 DAYS THEN TAKE 1 TABLET(25 MG) BY MOUTH EVERY DAY (Patient not taking: Reported on 04/08/2019) 90 tablet 0  . Magnesium Cl-Calcium Carbonate (SLOW MAGNESIUM/CALCIUM PO) Take 1 tablet by mouth daily.    . methotrexate 2.5 MG tablet Take 10 mg by mouth once a week.    . Omega-3 Fatty Acids (OMEGA-3 FISH OIL) 1200 MG CAPS 1,200 mg Two (2) times a day.     No current facility-administered medications for this visit.   REVIEW OF SYSTEMS:  '[X]'  denotes positive finding, '[ ]'  denotes negative finding  Cardiac  Comments:  Chest pain or chest pressure:    Shortness of breath upon exertion:    Short of breath when lying flat:    Irregular heart rhythm:        Vascular    Pain in calf, thigh, or hip brought on by ambulation: x Right thigh, left calf  Pain in feet at night that wakes you up from your sleep:     Blood clot in your veins:    Leg swelling:         Pulmonary    Oxygen at home:    Productive cough:     Wheezing:         Neurologic    Sudden weakness in arms or legs:     Sudden numbness in arms or legs:     Sudden onset of difficulty speaking or slurred speech:    Temporary loss of vision in one eye:     Problems with dizziness:         Gastrointestinal    Blood in stool:     Vomited blood:         Genitourinary    Burning when urinating:     Blood in urine:        Psychiatric    Major depression:         Hematologic    Bleeding problems:    Problems with blood clotting too easily:        Skin    Rashes or ulcers:        Constitutional    Fever or chills:    PHYSICAL EXAM:     Vitals:   04/21/19 1032  BP: (!) 141/91  Pulse: 89  Resp: 16  Temp: (!) 97.5 F (36.4 C)  TempSrc: Temporal  SpO2: 98%  Weight: 147 lb (66.7 kg)  Height: 5' 3.5" (1.613 m)   GENERAL: The patient is a well-nourished female, in no acute distress. The vital signs are documented above.  CARDIAC: There is a regular rate and rhythm.  VASCULAR:   Palpable femoral pulses bilaterally  No palpable pedal pulses  No lower extremity tissue loss  PULMONARY: There is good air exchange bilaterally without wheezing or rales.  ABDOMEN: Soft and non-tender with normal pitched bowel sounds.  MUSCULOSKELETAL: There are no major deformities or cyanosis.  NEUROLOGIC: No focal weakness or paresthesias are detected.  SKIN: There are no ulcers or rashes noted.  PSYCHIATRIC: The patient has a normal affect.  DATA:  ABIs from 04/15/2019 showed ABI 0.61 on the right and 0.6 on the left - monophasic both ankles  Right lower extremity arterial duplex showed a high-grade stenosis in the distal SFA with some additional moderate disease  proximally. There is a high-grade AT and PT stenosis on the right.  Left lower extremity arterial duplex shows a high-grade stenosis of the distal SFA. High grade stenosis in the TP trunk and PT.  Assessment/Plan:  79 year old female the presents with short distance lifestyle limiting claudication of the bilateral lower extremities. She has palpable femoral pulses bilaterally and her exam and noninvasive imaging suggest infrainguinal disease in review of her duplexes. She has multilevel disease including SFA and tibial disease. Discussed with her that we typically manage claudication conservatively with exercise and walking therapies as well as aspirin statin etc. She does not feel she can manage with her current symptoms. This is obviously lifestyle limiting given she cannot walk more than 10 -15 feet and cannot go about her daily activities. As result we recommended aortogram, lower extremity arteriogram, and possible intervention. We will schedule for next Wednesday. Risks and benefits discussed in detail.  Marty Heck, MD  [facility]  Office: [phone]

## 2019-04-30 DIAGNOSIS — I70213 Atherosclerosis of native arteries of extremities with intermittent claudication, bilateral legs: Secondary | ICD-10-CM | POA: Diagnosis not present

## 2019-04-30 DIAGNOSIS — R001 Bradycardia, unspecified: Secondary | ICD-10-CM | POA: Diagnosis not present

## 2019-04-30 DIAGNOSIS — I442 Atrioventricular block, complete: Secondary | ICD-10-CM

## 2019-04-30 DIAGNOSIS — I350 Nonrheumatic aortic (valve) stenosis: Secondary | ICD-10-CM

## 2019-04-30 LAB — HEMOGLOBIN A1C
Hgb A1c MFr Bld: 8 % — ABNORMAL HIGH (ref 4.8–5.6)
Mean Plasma Glucose: 183 mg/dL

## 2019-04-30 LAB — GLUCOSE, CAPILLARY: Glucose-Capillary: 240 mg/dL — ABNORMAL HIGH (ref 70–99)

## 2019-04-30 MED ORDER — ALUM & MAG HYDROXIDE-SIMETH 200-200-20 MG/5ML PO SUSP
30.0000 mL | Freq: Once | ORAL | Status: AC
  Administered 2019-04-30: 30 mL via ORAL
  Filled 2019-04-30: qty 30

## 2019-04-30 MED ORDER — CLOPIDOGREL BISULFATE 75 MG PO TABS: 75.0000 mg | ORAL_TABLET | Freq: Every day | ORAL | 6 refills | Status: DC

## 2019-04-30 MED FILL — Lidocaine HCl Local Soln Prefilled Syringe 100 MG/5ML (2%): INTRAMUSCULAR | Qty: 5 | Status: AC

## 2019-04-30 NOTE — Progress Notes (Signed)
Subjective:  Denies SSCP, palpitations or Dyspnea Wants to go home Spoke with husband who is also a patient of mine  Objective:  Vitals:   04/30/19 0400 04/30/19 0503 04/30/19 0651 04/30/19 0715  BP: (!) 171/78 (!) 166/62  (!) 165/71  Pulse: 87 85    Resp: 19 16 18  (!) 22  Temp: 98 F (36.7 C)     TempSrc: Oral     SpO2: 94%     Weight:      Height:        Intake/Output from previous day:  Intake/Output Summary (Last 24 hours) at 04/30/2019 0826 Last data filed at 04/30/2019 0500 Gross per 24 hour  Intake 967.18 ml  Output 3 ml  Net 964.18 ml    Physical Exam: Affect appropriate Pale female  HEENT: normal Neck supple with no adenopathy JVP normal no bruits no thyromegaly Lungs clear with no wheezing and good diaphragmatic motion Heart:  S1/S2 AS  murmur, no rub, gallop or click PMI normal Abdomen: benighn, BS positve, no tenderness, no AAA no bruit.  No HSM or HJR Left FA no hematoma looks good post intervention  No edema Neuro non-focal Skin warm and dry No muscular weakness   Lab Results: Basic Metabolic Panel: Recent Labs    04/29/19 0650 04/29/19 1002  NA 137 137  K 4.4 4.1  CL 103 105  CO2  --  21*  GLUCOSE 178* 199*  BUN 34* 29*  CREATININE 1.80* 1.46*  CALCIUM  --  8.6*   Liver Function Tests: Recent Labs    04/29/19 1002  AST 23  ALT 18  ALKPHOS 58  BILITOT 0.5  PROT 5.7*  ALBUMIN 2.9*   No results for input(s): LIPASE, AMYLASE in the last 72 hours. CBC: Recent Labs    04/29/19 0650 04/29/19 1002  WBC  --  5.9  NEUTROABS  --  3.2  HGB 13.6 13.9  HCT 40.0 41.6  MCV  --  93.1  PLT  --  206   Cardiac Enzymes: Recent Labs    04/29/19 1002  CKTOTAL 36*  CKMB 1.7   BNP: Invalid input(s): POCBNP D-Dimer: No results for input(s): DDIMER in the last 72 hours. Hemoglobin A1C: Recent Labs    04/29/19 1524  HGBA1C 8.0*    Imaging: PERIPHERAL VASCULAR CATHETERIZATION  Result Date: 04/29/2019 Patient name: MANASVINI WHATLEY         MRN: [id number]        DOB: March 09, 1940        Sex: female  04/29/2019 Pre-operative Diagnosis: Bilateral lower extremity short distance lifestyle limiting claudication Post-operative diagnosis:  Same Surgeon:  Marty Heck, MD Procedure Performed: 1.  Ultrasound-guided access of the left common femoral artery 2.  CO2 aortogram 3.  Right lower extremity arteriogram with selection of third order branches with CO2 and limited contrast 4.  Right SFA angioplasty with stent placement (predilated with a 4 mm Mustang, stented with 6 mm x 120 mm drug-coated Eluvia and 6 mm x 80 mm drug-coated Eluvia postdilated with a 5 mm Mustang) 5.  Mynx closure of the left common femoral artery 6.  68 minutes of monitored moderate conscious sedation time  Indications: 79 year old female that presented with bilateral lower extremity short distance lifestyle limiting claudication.  Ultimately she presents today for planned arteriogram with lower extremity runoff.  Pre-operative imaging suggested multi-level disease in SFA and tibials. We planned to work on the right leg first given more symtpomatic.  Risks and benefits have been discussed in detail.  Findings:  CO2 aortogram showed a very small infrarenal aorta with moderately diseased left common iliac but she has a great left femoral pulse and this does not appear flow-limiting.  Right lower extremity arteriogram shows a patent common femoral and profunda.  She has a diffusely diseased SFA in the mid to distal segment over about 150 mm with multiple focal greater than 80% stenosis.  She has a second lesion in the above-knee popliteal artery with about a 50% stenosis.  She has severe tibial disease with dominant anterior tibial runoff with multiple areas of high-grade stenosis in the proximal to mid segment and a peroneal that occludes in the mid to distal calf.  Posterior tibial is occluded throughout its course.  Dominant flow into the foot is via the anterior  tibial with filling of the dorsalis pedis (the anterior tibial artery looks much healthier distally in the calf).  Ultimately the lesion was crossed in the right SFA and this was predilated with a 4 mm Mustang balloon.  We had a focal dissection in the mid SFA after angioplasty.  We elected to primarily stent this with a 6 mm x 120 mm Eluvia and then postdilated this with a 5 mm Mustang balloon.  There was a second lesion at adductor canal just distal to our stent and then we treated with a second 6 mm x 80 mm Eluvia that was again post-dilated to 5 mm.   Ultimately she became bradycardic on the table dropped her heart rate to 97Q with a systolic blood pressure of 40.  We gave her atropine and ultimately her HR and BP came up.  We elected to stop after SFA intervention given her clinical condition and the fact that she only has claudication.  She will be admitted for serial labs, cardiac enzymes, and I will consult cardiology.             Procedure:  The patient was identified in the holding area and taken to room 8.  The patient was then placed supine on the table and prepped and draped in the usual sterile fashion.  A time out was called.  Ultrasound was used to evaluate the left common femoral artery.  It was patent .  A digital ultrasound image was acquired.  A micropuncture needle was used to access the left common femoral artery under ultrasound guidance.  An 018 wire was advanced without resistance and a micropuncture sheath was placed.  The 018 wire was removed and a benson wire was placed.  The micropuncture sheath was exchanged for a 5 french sheath.  An omniflush catheter was advanced over the wire to the level of L-1.  An abdominal angiogram was obtained with CO2.  Next, using the omniflush catheter was pulled down and bilateral iliac images were obtained with CO2. I then used a benson wire and the aortic bifurcation was crossed and the catheter was placed into theright external iliac artery and right  runoff was obtained.  We did have to use a long 100 cm straight flush catheter to get better pictures of the tibials.  We used a radioopaque ruler marker on right leg to limit contrast.  Ultimately we initially planned to treat the SFA given she only has claudication.  We used a long Glidewire advantage down the right SFA with straight flush catheter to cross all the lesions into the below knee popliteal artery.  Then exchanged for a long [place]  sheath in the left common femoral artery over the aortic bifurcation.  Patient was given 100 units/kg heparin.  We initially predilated this lesion in the SFA with a 4 x 150 mm Mustang.  On another hand-injection there was a focal dissection in the mid SFA.  I elected to primarily stent this and used a 6 mm x 120 mm ELuvia postdilated with a 5 mm Mustang and then a second 6 mm x 80 mm Eluvia given a lesion just distal to our first stent in the adductor canal.  Again the second stent was postdilated with a 5 mm Mustang.  She became bradycardic with a heart rate in the 30s and dropped her systolic to 40.  She was given atropine.  We put pacing pads on her.  Ultimately we elected to stop and I am hopeful by treating her SFA given only claudication this will resolve her symptoms.  I did not shoot the runoff of her left leg given her clinical deterioration.  Mynx closure was placed in the left common femoral artery after a short 6 French sheath was placed.  Should be taken to PACU and admitted for observation and serial labs.  I will consult cardiology.  Plan: I will admit her overnight for serial labs given bradycardia and hypotension during the case.  Cardiology has also been consulted She was loaded on Plavix for right SFA stents.  I will plan to follow-up with her and see how her right leg improves in one month before intervening on the left pending her clinical condition.  She has intolerance to statins.  Marty Heck, MD [facility] Office: [phone]   Cardiac Studies:  ECG: NSR no acute changes    Telemetry:  NSR rates in 90's no AV block or bradycardia   Echo: 01/01/19 EF 60-65% moderate AS mean gradient 25 mmHg  Medications:   . aspirin EC  81 mg Oral Daily  . clopidogrel  300 mg Oral Once   Followed by  . clopidogrel  75 mg Oral Q breakfast  . glipiZIDE  10 mg Oral Q breakfast  . insulin aspart  0-5 Units Subcutaneous QHS  . insulin aspart  0-9 Units Subcutaneous TID WC  . levothyroxine  75 mcg Oral QAC breakfast  . linagliptin  5 mg Oral Daily  . losartan  50 mg Oral Daily  . nitroGLYCERIN      . sodium chloride flush  3 mL Intravenous Q12H     . sodium chloride      Assessment/Plan:   1. Bradycardia/Hypotension:  Post PV intervention Seems like she had a vagal reaction . No arrhythmia over night quickly responded to atropine and fluid in lab ECG ok Not on beta blocker Apparently EP to see today No need for PPM They may recommend outpatient monitor but I don't think she needs it Ok to d/c home  2. AS:  Moderate by last echo December mean gradient 25 mmHg f/u echo December 2021   3. DM:  Discussed low carb diet.  Target hemoglobin A1c is 6.5 or less.  Continue current medications.  4. Thyroid:  Continue replacement   5. PV:  Post right SFA stent with Mynx closure outpatient F/U [place] no hematoma needs more work on left side   6. CAD: distant PCI troponin 20 , 19 no trend no acute ECG changes and no chest pain. We will do lexiscan myovue next week before repeat intervention to left leg to r/o progressive  CAD  [name] 04/30/2019, 8:26 AM

## 2019-04-30 NOTE — Consult Note (Addendum)
ELECTROPHYSIOLOGY CONSULT NOTE    Patient ID: [name] MRN: [id number], DOB/AGE: 08/30/1940 79 y.o.  Admit date: 04/29/2019 Date of Consult: 04/30/2019  Primary Physician: Lucille Passy, MD Primary Cardiologist: [name], MD  Electrophysiologist: New   Referring Provider: Dr. Margaretann Loveless  Patient Profile: [name] is a 79 y.o. female with a hx of CAD status post PCI 2007, hypertension, DM, hyperlipidemia, moderate aortic stenosis, history of breast cancer and PAD who is being seen today for the evaluation of bradycardia/hypotension at the request of Dr. Margaretann Loveless.  HPI:  [name] is a 79 y.o. female with medical history above.   She presented on 04/29/2019 for planned aortogram, LE angiogram with possible intervention. Underwent intervention to R SFA with stent, mid SFA dissection with balloon, and adductor canal with stent. After this she became profoundly bradycardia as low as 29 and hypotension. She was given atropine with quick resolution of her symptoms. EP asked to see due to period of A-V dissociation after atropine.   Pt is feeling much better this am. She had an episode of chest pain last night she says is consistent with her gas pain.  She has no history of lightheadedness, syncope, or near syncope. She was her USOH until this procedure, her only limitation was LE fatigue with exertion in setting of PAD. She is able to do ADLs and mild to moderate exertion independently and without symptoms.  She denies any history of frank or near syncope.    Past Medical History:  Diagnosis Date  . Allergy   . Anemia    childhood  . Anxiety   . Arterial stenosis (HCC)    mesenteric  . Arthritis   . Breast cancer ([place]) 12/05/10   R breast, inv mammary, in situ,ER/PR +,HER2 -  . Cancer ([place])   . Carcinoma of breast treated with adjuvant chemotherapy ([place])   . Coronary artery disease    stent 2007  . Depression   . Diabetes mellitus   . Difficulty sleeping   .  Diverticulosis 12/10/03  . Elevated cholesterol   . Generalized weakness   . GERD (gastroesophageal reflux disease)   . Heart murmur    secondary to chem. 2013  . Hepatitis    as an infant  . HSV-1 (herpes simplex virus 1) infection   . Hyperlipidemia   . Hypertension   . Hypothyroidism   . Osteoarthritis   . Personal history of chemotherapy   . Personal history of radiation therapy   . Pneumonia    2014 september  . 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 Spinal stenosis   . Thyroid disease      Surgical History:  Past Surgical History:  Procedure Laterality Date  . ABDOMINAL AORTOGRAM W/LOWER EXTREMITY Bilateral 04/29/2019   Procedure: ABDOMINAL AORTOGRAM W/LOWER EXTREMITY;  Surgeon: Marty Heck, MD;  Location: [facility];  Service: Cardiovascular;  Laterality: Bilateral;  . ANTERIOR AND POSTERIOR REPAIR N/A 04/05/2016   Procedure: ANTERIOR (CYSTOCELE) AND POSTERIOR REPAIR (RECTOCELE);  Surgeon: [name], MD;  Location: [facility];  Service: Gynecology;  Laterality: N/A;  . BREAST BIOPSY    . BREAST LUMPECTOMY  1983   benign biopsy  . BREAST LUMPECTOMY Right 12/29/2010   snbx, ER/PR +, Her2 -, 0/1 node pos.  . CAROTID STENT  2007  . CARPAL TUNNEL RELEASE Bilateral 9/12,8,12  . CHOLECYSTECTOMY    . COLONOSCOPY N/A 02/09/2013   Procedure: COLONOSCOPY;  Surgeon: [name], MD;  Location: [name];  Service: Endoscopy;  Laterality: N/A;  . COLONOSCOPY    . CORONARY ANGIOPLASTY    . DILATION AND CURETTAGE OF UTERUS    . ESOPHAGOGASTRODUODENOSCOPY N/A 02/09/2013   Procedure: ESOPHAGOGASTRODUODENOSCOPY (EGD);  Surgeon: [name], MD;  Location: [name];  Service: Endoscopy;  Laterality: N/A;  . FOOT SPURS    . HYSTEROSCOPY WITH D & C N/A 09/11/2012   Procedure: DILATATION AND CURETTAGE ;  Surgeon: [name], MD;  Location: [facility];  Service: Gynecology;  Laterality: N/A;  . KNEE SURGERY  2007  . LAPAROSCOPIC ASSISTED  VAGINAL HYSTERECTOMY N/A 04/05/2016   Procedure: LAPAROSCOPIC ASSISTED VAGINAL HYSTERECTOMY possible BSO;  Surgeon: [name], MD;  Location: [facility];  Service: Gynecology;  Laterality: N/A;  . LUMBAR LAMINECTOMY/DECOMPRESSION MICRODISCECTOMY N/A 10/20/2014   Procedure: MICRO LUMBER DECOMPRESSION L3-4 L4-5;  Surgeon: [name], MD;  Location: [facility];  Service: Orthopedics;  Laterality: N/A;  . LUMBAR LAMINECTOMY/DECOMPRESSION MICRODISCECTOMY Bilateral 10/13/2015   Procedure: MICRO LUMBAR DECOMPRESSION L5 - S1 AND REDO DECOMPRESSION L4 - L5 AND REMOVAL OF FACET CYST L4 - L5 2 LEVELS;  Surgeon: [name], MD;  Location: [facility];  Service: Orthopedics;  Laterality: Bilateral;  . PERIPHERAL VASCULAR INTERVENTION Right 04/29/2019   Procedure: PERIPHERAL VASCULAR INTERVENTION;  Surgeon: Marty Heck, MD;  Location: [facility];  Service: Cardiovascular;  Laterality: Right;  . PORT-A-CATH REMOVAL  04/17/2011   Procedure: REMOVAL PORT-A-CATH;  Surgeon: [name], MD;  Location: [facility];  Service: General;  Laterality: Left;  . PORTACATH PLACEMENT  02/07/2011   Procedure: INSERTION PORT-A-CATH;  Surgeon: [name], MD;  Location: [facility];  Service: General;  Laterality: N/A;  . UPPER GASTROINTESTINAL ENDOSCOPY       Medications Prior to Admission  Medication Sig Dispense Refill Last Dose  . acetaminophen (TYLENOL) 500 MG tablet Take 1,000 mg by mouth every 6 (six) hours as needed for moderate pain or headache.   Past Month at Unknown time  . aspirin 81 MG tablet Take 1 tablet (81 mg total) by mouth daily. Resume 4 days post-op   04/28/2019 at Unknown time  . calcium carbonate (TUMS - DOSED IN MG ELEMENTAL CALCIUM) 500 MG chewable tablet Chew 2 tablets by mouth 3 (three) times daily as needed for indigestion or heartburn.   Past Week at Unknown time  . chlorthalidone (HYGROTON) 25 MG tablet TAKE 1/2 TABLET(12.5 MG) BY MOUTH EVERY DAY FOR 3 DAYS THEN TAKE 1  TABLET(25 MG) BY MOUTH EVERY DAY (Patient taking differently: Take 25 mg by mouth daily as needed (high blood pressure). ) 90 tablet 0 Past Month at Unknown time  . Cholecalciferol (VITAMIN D) 2000 units CAPS Take 2,000 Units by mouth daily.    04/28/2019 at Unknown time  . clobetasol cream (TEMOVATE) 2.44 % Apply 1 application topically 2 (two) times daily. (Patient taking differently: Apply 1 application topically 2 (two) times daily as needed (itching). ) 30 g 0 Past Month at Unknown time  . Dulaglutide (TRULICITY) 0.10 UV/2.5DG SOPN Inject 0.75 mg into the skin once a week. (Patient taking differently: Inject 0.75 mg into the skin every Saturday. ) 1 pen 3 Past Week at Unknown time  . glipiZIDE (GLUCOTROL XL) 10 MG 24 hr tablet Take 1 tablet (10 mg total) by mouth daily with breakfast. 90 tablet 3 04/28/2019 at Unknown time  . JANUVIA 100 MG tablet TAKE 1 TABLET BY MOUTH  DAILY (  Patient taking differently: Take 100 mg by mouth daily. ) 90 tablet 3 04/28/2019 at Unknown time  . levothyroxine (SYNTHROID) 75 MCG tablet TAKE 1 TABLET BY MOUTH ONCE DAILY AT 6 AM (Patient taking differently: Take 75 mcg by mouth daily before breakfast. ) 90 tablet 1 04/28/2019 at Unknown time  . losartan (COZAAR) 50 MG tablet TAKE 1 TABLET BY MOUTH  DAILY (Patient taking differently: Take 50 mg by mouth daily. ) 90 tablet 3 04/29/2019 at 0545  . metFORMIN (GLUCOPHAGE) 500 MG tablet Take 1 tablet (500 mg total) by mouth daily with breakfast. 90 tablet 3 04/28/2019 at Unknown time  . pantoprazole (PROTONIX) 40 MG tablet TAKE 1 TABLET BY MOUTH  DAILY (Patient taking differently: Take 40 mg by mouth daily. ) 90 tablet 1 04/28/2019 at Unknown time  . PARoxetine (PAXIL) 20 MG tablet TAKE 1 TABLET BY MOUTH EVERY DAY IN THE MORNING (Patient taking differently: Take 20 mg by mouth daily. ) 90 tablet 1 04/28/2019 at Unknown time  . vitamin B-12 (CYANOCOBALAMIN) 500 MCG tablet Take 500 mcg by mouth daily.   04/28/2019 at Unknown time  . ferrous  sulfate (FERROUSUL) 325 (65 FE) MG tablet Take 1 tablet (325 mg total) by mouth daily with breakfast. (Patient not taking: Reported on 04/22/2019) 30 tablet 3 Not Taking at Unknown time  . glucose blood (ACCU-CHEK GUIDE) test strip TEST ONCE DAILY 100 each 5   . Lancet Device MISC 100 each by Does not apply route daily. UAD for daily glucose monitoring 100 each PRN   . Lancets Misc. (ACCU-CHEK MULTICLIX LANCET DEV) KIT Use as directed to check sugars     . nitroGLYCERIN (NITROSTAT) 0.4 MG SL tablet Place 1 tablet (0.4 mg total) under the tongue every 5 (five) minutes as needed for chest pain. 25 tablet 3 Unknown at Unknown time  . valACYclovir (VALTREX) 1000 MG tablet TAKE 1 TABLET BY MOUTH 2  TIMES DAILY AS NEEDED FOR  VIRAL INFECTION. (Patient taking differently: Take 1,000 mg by mouth 2 (two) times daily as needed (fever blisters). ) 180 tablet 0 More than a month at Unknown time    Inpatient Medications:  . aspirin EC  81 mg Oral Daily  . clopidogrel  300 mg Oral Once   Followed by  . clopidogrel  75 mg Oral Q breakfast  . glipiZIDE  10 mg Oral Q breakfast  . insulin aspart  0-5 Units Subcutaneous QHS  . insulin aspart  0-9 Units Subcutaneous TID WC  . levothyroxine  75 mcg Oral QAC breakfast  . linagliptin  5 mg Oral Daily  . losartan  50 mg Oral Daily  . nitroGLYCERIN      . sodium chloride flush  3 mL Intravenous Q12H    Allergies:  Allergies  Allergen Reactions  . Codeine Nausea And Vomiting and Other (See Comments)    Severe stomach cramps  . Antihistamines, Diphenhydramine-Type Other (See Comments)    Causes hyperactivity  . Gabapentin     Urinary incontinence  . Statins Other (See Comments)    Joint pains  . Sulfa Antibiotics     Unknown to pt  . Erythromycin Hives and Rash    Due to dental work about 50 years ago. Was used in packing and resulted in rash/hives inside and outside of mouth.    Social History   Socioeconomic History  . Marital status: Married     Spouse name: Not on file  . Number of children: 2  .  Years of education: Not on file  . Highest education level: Not on file  Occupational History  . Occupation: retired  Tobacco Use  . Smoking status: Former Smoker    Packs/day: 2.00    Years: 40.00    Pack years: 80.00    Quit date: 12/19/1997    Years since quitting: 21.3  . Smokeless tobacco: Never Used  Substance and Sexual Activity  . Alcohol use: Yes    Alcohol/week: 6.0 standard drinks    Types: 6 Glasses of wine per week    Comment: occasional  . Drug use: No  . Sexual activity: Not on file    Comment: menarch age 62, P3, HRT X 10 YRS, MENOPAUSE MID 40'S  Other Topics Concern  . Not on file  Social History Narrative   [name] age 89- [place] age 28     Social Determinants of Health   Financial Resource Strain:   . Difficulty of Paying Living Expenses:   Food Insecurity:   . Worried About Charity fundraiser in the Last Year:   . Arboriculturist in the Last Year:   Transportation Needs:   . Film/video editor (Medical):   Marland Kitchen Lack of Transportation (Non-Medical):   Physical Activity:   . Days of Exercise per Week:   . Minutes of Exercise per Session:   Stress:   . Feeling of Stress :   Social Connections:   . Frequency of Communication with Friends and Family:   . Frequency of Social Gatherings with Friends and Family:   . Attends Religious Services:   . Active Member of Clubs or Organizations:   . Attends Archivist Meetings:   Marland Kitchen Marital Status:   Intimate Partner Violence:   . Fear of Current or Ex-Partner:   . Emotionally Abused:   Marland Kitchen Physically Abused:   . Sexually Abused:      Family History  Problem Relation Age of Onset  . Throat cancer Father   . Alcoholism Father   . Heart attack Father   . Heart disease Mother   . Lymphoma Brother 15  . Cancer Son   . Heart attack Brother   . Diabetes Brother   . Hypertension Brother   . Colon cancer Neg Hx   .  Stroke Neg Hx   . Esophageal cancer Neg Hx   . Rectal cancer Neg Hx   . Stomach cancer Neg Hx      Review of Systems: All other systems reviewed and are otherwise negative except as noted above.  Physical Exam: Vitals:   04/30/19 0400 04/30/19 0503 04/30/19 0651 04/30/19 0715  BP: (!) 171/78 (!) 166/62  (!) 165/71  Pulse: 87 85  85  Resp: '19 16 18 ' (!) 22  Temp: 98 F (36.7 C)   98 F (36.7 C)  TempSrc: Oral   Oral  SpO2: 94%   96%  Weight:      Height:        GEN- The patient is well appearing, alert and oriented x 3 today.   HEENT: normocephalic, atraumatic; sclera clear, conjunctiva pink; hearing intact; oropharynx clear; neck supple Lungs- Clear to ausculation bilaterally, normal work of breathing.  No wheezes, rales, rhonchi Heart- Regular rate and rhythm, no murmurs, rubs or gallops GI- soft, non-tender, non-distended, bowel sounds present Extremities- no clubbing, cyanosis, or edema; DP/PT/radial pulses 2+ bilaterally MS- no significant deformity or atrophy Skin- warm and dry, no rash or lesion Psych-  euthymic mood, full affect Neuro- strength and sensation are intact  Labs:   Lab Results  Component Value Date   WBC 5.9 04/29/2019   HGB 13.9 04/29/2019   HCT 41.6 04/29/2019   MCV 93.1 04/29/2019   PLT 206 04/29/2019    Recent Labs  Lab 04/29/19 1002  NA 137  K 4.1  CL 105  CO2 21*  BUN 29*  CREATININE 1.46*  CALCIUM 8.6*  PROT 5.7*  BILITOT 0.5  ALKPHOS 58  ALT 18  AST 23  GLUCOSE 199*      Radiology/Studies: PERIPHERAL VASCULAR CATHETERIZATION  Result Date: 04/29/2019 Patient name: BRANDILEE PIES         MRN: [id number]        DOB: [birth year]        Sex: female  04/29/2019 Pre-operative Diagnosis: Bilateral lower extremity short distance lifestyle limiting claudication Post-operative diagnosis:  Same Surgeon:  Marty Heck, MD Procedure Performed: 1.  Ultrasound-guided access of the left common femoral artery 2.  CO2 aortogram 3.  Right  lower extremity arteriogram with selection of third order branches with CO2 and limited contrast 4.  Right SFA angioplasty with stent placement (predilated with a 4 mm Mustang, stented with 6 mm x 120 mm drug-coated Eluvia and 6 mm x 80 mm drug-coated Eluvia postdilated with a 5 mm Mustang) 5.  Mynx closure of the left common femoral artery 6.  68 minutes of monitored moderate conscious sedation time  Indications: 79 year old female that presented with bilateral lower extremity short distance lifestyle limiting claudication.  Ultimately she presents today for planned arteriogram with lower extremity runoff.  Pre-operative imaging suggested multi-level disease in SFA and tibials. We planned to work on the right leg first given more symtpomatic.  Risks and benefits have been discussed in detail.  Findings:  CO2 aortogram showed a very small infrarenal aorta with moderately diseased left common iliac but she has a great left femoral pulse and this does not appear flow-limiting.  Right lower extremity arteriogram shows a patent common femoral and profunda.  She has a diffusely diseased SFA in the mid to distal segment over about 150 mm with multiple focal greater than 80% stenosis.  She has a second lesion in the above-knee popliteal artery with about a 50% stenosis.  She has severe tibial disease with dominant anterior tibial runoff with multiple areas of high-grade stenosis in the proximal to mid segment and a peroneal that occludes in the mid to distal calf.  Posterior tibial is occluded throughout its course.  Dominant flow into the foot is via the anterior tibial with filling of the dorsalis pedis (the anterior tibial artery looks much healthier distally in the calf).  Ultimately the lesion was crossed in the right SFA and this was predilated with a 4 mm Mustang balloon.  We had a focal dissection in the mid SFA after angioplasty.  We elected to primarily stent this with a 6 mm x 120 mm Eluvia and then  postdilated this with a 5 mm Mustang balloon.  There was a second lesion at adductor canal just distal to our stent and then we treated with a second 6 mm x 80 mm Eluvia that was again post-dilated to 5 mm.   Ultimately she became bradycardic on the table dropped her heart rate to 79T with a systolic blood pressure of 40.  We gave her atropine and ultimately her HR and BP came up.  We elected to stop after SFA intervention given her  clinical condition and the fact that she only has claudication.  She will be admitted for serial labs, cardiac enzymes, and I will consult cardiology.             Procedure:  The patient was identified in the holding area and taken to room 8.  The patient was then placed supine on the table and prepped and draped in the usual sterile fashion.  A time out was called.  Ultrasound was used to evaluate the left common femoral artery.  It was patent .  A digital ultrasound image was acquired.  A micropuncture needle was used to access the left common femoral artery under ultrasound guidance.  An 018 wire was advanced without resistance and a micropuncture sheath was placed.  The 018 wire was removed and a benson wire was placed.  The micropuncture sheath was exchanged for a 5 french sheath.  An omniflush catheter was advanced over the wire to the level of L-1.  An abdominal angiogram was obtained with CO2.  Next, using the omniflush catheter was pulled down and bilateral iliac images were obtained with CO2. I then used a benson wire and the aortic bifurcation was crossed and the catheter was placed into theright external iliac artery and right runoff was obtained.  We did have to use a long 100 cm straight flush catheter to get better pictures of the tibials.  We used a radioopaque ruler marker on right leg to limit contrast.  Ultimately we initially planned to treat the SFA given she only has claudication.  We used a long Glidewire advantage down the right SFA with straight flush catheter  to cross all the lesions into the below knee popliteal artery.  Then exchanged for a long 6 [place] Ansell sheath in the left common femoral artery over the aortic bifurcation.  Patient was given 100 units/kg heparin.  We initially predilated this lesion in the SFA with a 4 x 150 mm Mustang.  On another hand-injection there was a focal dissection in the mid SFA.  I elected to primarily stent this and used a 6 mm x 120 mm ELuvia postdilated with a 5 mm Mustang and then a second 6 mm x 80 mm Eluvia given a lesion just distal to our first stent in the adductor canal.  Again the second stent was postdilated with a 5 mm Mustang.  She became bradycardic with a heart rate in the 30s and dropped her systolic to 40.  She was given atropine.  We put pacing pads on her.  Ultimately we elected to stop and I am hopeful by treating her SFA given only claudication this will resolve her symptoms.  I did not shoot the runoff of her left leg given her clinical deterioration.  Mynx closure was placed in the left common femoral artery after a short 6 French sheath was placed.  Should be taken to PACU and admitted for observation and serial labs.  I will consult cardiology.  Plan: I will admit her overnight for serial labs given bradycardia and hypotension during the case.  Cardiology has also been consulted She was loaded on Plavix for right SFA stents.  I will plan to follow-up with her and see how her right leg improves in one month before intervening on the left pending her clinical condition.  She has intolerance to statins.  Marty Heck, MD [facility] Office: [phone]  VAS [place] ABI WITH/WO TBI  Result Date: 04/16/2019 LOWER EXTREMITY DOPPLER  STUDY Indications: Patient presents today with complaints of bilateral thigh              claudication symptoms after walking a short distance. She was              experiencing pain half way from the office lobby to the vascular               department. This has been going on for 3-4 months. She denies any              rest pain. High Risk Factors: Hypertension, hyperlipidemia, Diabetes, past history of                    smoking, coronary artery disease.  Comparison Study: In 12/2016, an arterial Doppler showed an ABI of 1.09 on the                   right and 1.10 on the left. Performing Technologist: Sharlett Iles RVT  Examination Guidelines: A complete evaluation includes at minimum, Doppler waveform signals and systolic blood pressure reading at the level of bilateral brachial, anterior tibial, and posterior tibial arteries, when vessel segments are accessible. Bilateral testing is considered an integral part of a complete examination. Photoelectric Plethysmograph (PPG) waveforms and toe systolic pressure readings are included as required and additional duplex testing as needed. Limited examinations for reoccurring indications may be performed as noted.  ABI Findings: +---------+------------------+-----+-------------------+------------------+ Right    Rt Pressure (mmHg)IndexWaveform           Comment            +---------+------------------+-----+-------------------+------------------+ Brachial                                           Past Breast Cancer +---------+------------------+-----+-------------------+------------------+ ATA      89                0.55 monophasic         brisk              +---------+------------------+-----+-------------------+------------------+ PTA      100               0.61 monophasic         brisk              +---------+------------------+-----+-------------------+------------------+ PERO     78                0.48 dampened monophasic                   +---------+------------------+-----+-------------------+------------------+ Great Toe77                0.47 Abnormal                              +---------+------------------+-----+-------------------+------------------+  +---------+------------------+-----+-------------------+-------+ Left     Lt Pressure (mmHg)IndexWaveform           Comment +---------+------------------+-----+-------------------+-------+ Brachial 163                                               +---------+------------------+-----+-------------------+-------+ ATA      97  0.60 monophasic                 +---------+------------------+-----+-------------------+-------+ PTA      86                0.53 monophasic         brisk   +---------+------------------+-----+-------------------+-------+ PERO     84                0.52 dampened monophasic        +---------+------------------+-----+-------------------+-------+ Great Toe89                0.55 Abnormal                   +---------+------------------+-----+-------------------+-------+ +-------+-----------+-----------+------------+------------+ ABI/TBIToday's ABIToday's TBIPrevious ABIPrevious TBI +-------+-----------+-----------+------------+------------+ Right  .61        .47        1.09        .61          +-------+-----------+-----------+------------+------------+ Left   .60        .55        1.10        .72          +-------+-----------+-----------+------------+------------+ Bilateral ABIs and TBIs appear decreased compared to prior study on 01/17/2017.  Summary: Right: Resting right ankle-brachial index indicates moderate right lower extremity arterial disease. The right toe-brachial index is abnormal. Left: Resting left ankle-brachial index indicates moderate left lower extremity arterial disease. The left toe-brachial index is abnormal.  *See table(s) above for measurements and observations. See LE Arterial duplex report. Vascular consult recommended. Electronically signed by Larae Grooms MD on 04/16/2019 at 11:52:29 AM.    Final    VAS [place] LOWER EXTREMITY ARTERIAL DUPLEX  Result Date: 04/16/2019 LOWER EXTREMITY ARTERIAL DUPLEX STUDY  Indications: Patient presents today with complaints of bilateral thigh              claudication symptoms after walking a short distance. She was              experiencing pain half way from the office lobby to the vascular              department. This has been going on for 3-4 months. She denies any              rest pain. High Risk Factors: Hypertension, hyperlipidemia, Diabetes, past history of                    smoking, coronary artery disease.  Current ABI: .61 on the right and .60 on the left. Comparison Study: NA Performing Technologist: Sharlett Iles RVT  Examination Guidelines: A complete evaluation includes B-mode imaging, spectral Doppler, color Doppler, and power Doppler as needed of all accessible portions of each vessel. Bilateral testing is considered an integral part of a complete examination. Limited examinations for reoccurring indications may be performed as noted.  +-----------+--------+-----+---------------+----------------+------------------+ RIGHT      PSV cm/sRatioStenosis       Waveform        Comments           +-----------+--------+-----+---------------+----------------+------------------+ CFA Prox   113                         biphasic                           +-----------+--------+-----+---------------+----------------+------------------+ DFA  76                          biphasic                           +-----------+--------+-----+---------------+----------------+------------------+ SFA Prox   223          30-49%         biphasic        proximal/mid                               stenosis, high                 segment                                    end range                                         +-----------+--------+-----+---------------+----------------+------------------+ SFA Mid    248          50-74% stenosisbiphasic                            +-----------+--------+-----+---------------+----------------+------------------+ SFA Distal 374          75-99%         biphasic                                                   stenosis, per                                                             velocity ratio                                    +-----------+--------+-----+---------------+----------------+------------------+ POP Prox   70                          barely triphasic                   +-----------+--------+-----+---------------+----------------+------------------+ POP Mid    48                          biphasic                           +-----------+--------+-----+---------------+----------------+------------------+ POP Distal 88                          biphasic                           +-----------+--------+-----+---------------+----------------+------------------+ TP Trunk  38                          biphasic                           +-----------+--------+-----+---------------+----------------+------------------+ ATA Prox   293          75-99%         biphasic        VR of 8.1                                  stenosis, per                                                             velocity ratio                                    +-----------+--------+-----+---------------+----------------+------------------+ ATA Mid    150          50-74%         biphasic        VR of 2.6                                  stenosis, per                                                             velocity ratio                                    +-----------+--------+-----+---------------+----------------+------------------+ ATA Distal 25                          monophasic                         +-----------+--------+-----+---------------+----------------+------------------+ PTA Prox   32                          biphasic                            +-----------+--------+-----+---------------+----------------+------------------+ PTA Mid    231          75-99%         biphasic        VR of 9.6 with                             stenosis, per                  post stenotic  velocity ratio                 turbulence         +-----------+--------+-----+---------------+----------------+------------------+ PTA Distal 22                          monophasic                         +-----------+--------+-----+---------------+----------------+------------------+ PERO Prox  33                          biphasic                           +-----------+--------+-----+---------------+----------------+------------------+ PERO Mid   28                          biphasic                           +-----------+--------+-----+---------------+----------------+------------------+ PERO Distal0            occluded                                          +-----------+--------+-----+---------------+----------------+------------------+ A focal velocity elevation of 223 cm/s was obtained at proximal/mid SFA with post stenotic turbulence with a VR of 1.8. Findings are characteristic of 30-49% stenosis. A 2nd focal velocity elevation was visualized, measuring 248 cm/s at mid SFA with post  stenotic turbulence with a VR of 2.1. Findings are characteristic of 50-74% stenosis. A 3rd focal velocity elevation was visualized, measuring 374 cm/s at distal SFA with post stenotic turbulence with a VR of 5.6. Findings are characteristic of 75-99% stenosis.  +-----------+--------+-----+------------+---------------+----------------------+ LEFT       PSV cm/sRatioStenosis    Waveform       Comments               +-----------+--------+-----+------------+---------------+----------------------+ CFA Prox   108                      biphasic                               +-----------+--------+-----+------------+---------------+----------------------+ DFA        66                       biphasic                              +-----------+--------+-----+------------+---------------+----------------------+ SFA Prox   68                       biphasic                              +-----------+--------+-----+------------+---------------+----------------------+ SFA Mid    163          50-74%      biphasic  stenosis,                                                                 per velocity                                                              ratio                                             +-----------+--------+-----+------------+---------------+----------------------+ SFA Distal 294          75-99%      biphasic       mid/distal segment                             stenosis,                                                                 per velocity                                                              ratio                                             +-----------+--------+-----+------------+---------------+----------------------+ POP Prox   47                       biphasic                              +-----------+--------+-----+------------+---------------+----------------------+ POP Mid    44                       biphasic                              +-----------+--------+-----+------------+---------------+----------------------+ POP Distal 39                       biphasic                              +-----------+--------+-----+------------+---------------+----------------------+ TP Trunk   277          75-99%      biphasic  distal                                         stenosis,                  popliteal/proximal                             per velocity               TPT, VR of 4.1                                  ratio                                             +-----------+--------+-----+------------+---------------+----------------------+ ATA Prox   33                       biphasic                              +-----------+--------+-----+------------+---------------+----------------------+ ATA Mid    0            occluded                                          +-----------+--------+-----+------------+---------------+----------------------+ ATA Distal 0            occluded                                          +-----------+--------+-----+------------+---------------+----------------------+ PTA Prox   228          75-99%      biphasic       VR of 4.1                                      stenosis,                                                                 per velocity                                                              ratio                                             +-----------+--------+-----+------------+---------------+----------------------+  PTA Mid    0            occluded                                          +-----------+--------+-----+------------+---------------+----------------------+ PTA Distal 18                       monophasic                            +-----------+--------+-----+------------+---------------+----------------------+ PERO Prox  56                       biphasic                              +-----------+--------+-----+------------+---------------+----------------------+ PERO Mid   34                       barely biphasic                       +-----------+--------+-----+------------+---------------+----------------------+ PERO Distal0            occluded                                          +-----------+--------+-----+------------+---------------+----------------------+ A focal velocity elevation of 163 cm/s was obtained at mid SFA with a VR of 2.2. Findings  are characteristic of 50-74% stenosis. A 2nd focal velocity elevation was visualized, measuring 294 cm/s at mid/distal SFA with post stenotic turbulence with a VR of 5.65. Findings are characteristic of 75-99% stenosis. A 3rd focal velocity elevation was visualized, measuring 277 cm/s at distal popliteal/proximal TPT with post stenotic turbulence with a VR of 7.29. Findings are characteristic of 75-99% stenosis.  Summary: Right: Heterogenous plaque throughout. 30-49% stenosis in proximal/mid SFA, high end range. 50-74% stenosis in the mid SFA. 75-99% stenosis in the distal SFA, per velocity ratio. 75-99% stenosis in the proximal ATA;50-74% stenosis in the mid ATA, per velocity ratio. 75-99% stenosis in the mid PTA, per velocity ratio. Occluded distal peroneal artery. Left: Heterogenous plaque throughout. 50-74% stenosis in the mid SFA, per velocity ratio. 75-99% stenosis in the mid/distal SFA, per velocity ratio. 75-99% stenosis in the distal popliteal artery/proximal TPT, per velocity ratio. Occluded mid and distal ATA. 75-99% stenosis in the proximal PTA, per velocity ratio. Occluded mid PTA. Occluded distal peroneal artery.  See table(s) above for measurements and observations. See ABI report. Vascular consult recommended. Electronically signed by Larae Grooms MD on 04/16/2019 at 11:56:30 AM.    Final     EKG: yesterday shows NSR at 91 bpm with normal intervals (personally reviewed) Strips from procedure reviewed with Dr. Rayann Heman. Appears vagal related. At least one strip shows lengthening of PR interval as well as RR interval. Pt did have a brief period of A-V dissociation s/p atropine that quickly resolved.   TELEMETRY: NSR overnight (personally reviewed)  Assessment/Plan: 1. Intra-procedural Bradycardia  Reviewed strips with Dr. Rayann Heman.  Suspect likely vagal-mediated. No history of syncope or near syncope. Although AV dissociation s/p atropine could indicate underlying conduction disease. She  may eventually need a pacemaker, but  no immediate indication.  Pt would like to avoid pacemaker if possible. Will plan watchful waiting for now.   2. Moderate AS By Echo. Per primary/Dr. 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 Carlis Abbott. She will eventually need more work on left side per notes, was abandoned early in setting of bradycardia.   4. CAD Per Dr. Melene Muller. Plan Lexiscan Myoview prior to further PV interventions to r/u progressive CAD.   For questions or updates, please contact [facility] Please consult [email or web address] for contact info under Cardiology/STEMI.  Signed, Shirley Friar, PA-C  04/30/2019 9:39 AM  I have seen, examined the patient, and reviewed the above assessment and plan.  Changes to above are made where necessary.  On exam, RRR.  The patient had transient AV block in the setting of surgery.  She had also received labetolol preoperatively..  She denies any symptoms of bradycardia prior to this event.  She has done well overnight on telemetry with no further AV block.  Baseline ekg is not worrisome. I have discussed at length with the patient today.  I suspect that she has some degree of conduction system disease which could progress with time.  We have discussed option of watchful waiting vs pacemaker.  She is very clear that she would like to avoid pacing at this time. We will reconsider if further AV block occurs in the future. OK to discharge from EP standpoint.  Electrophysiology team to see as needed while here. Please call with questions.   Co Sign: Thompson Grayer, MD 04/30/2019 11:28 AM

## 2019-04-30 NOTE — Progress Notes (Addendum)
Progress Note    04/30/2019 7:58 AM 1 Day Post-Op  Subjective:  Feels well this morning. Has been up ambulating in room. Denies any chest pain, shortness of breath, abdominal pain or any lower extremity pain. She is eager to go home   Vitals:   04/30/19 0651 04/30/19 0715  BP:  (!) 165/71  Pulse:    Resp: 18 (!) 22  Temp:    SpO2:     Physical Exam: General: well appearing,well nourished, not in any pain Cardiac:  regular Lungs:  Non labored Extremities:  Left femoral access site clean, dry and intact. No hematoma. No bleeding. 2+ palpable femoral pulses bilaterally. Distal pulses not palpable Abdomen:  Soft, non tender Neurologic: alert and oriented  CBC    Component Value Date/Time   WBC 5.9 04/29/2019 1002   RBC 4.47 04/29/2019 1002   HGB 13.9 04/29/2019 1002   HGB 12.0 02/21/2016 1035   HCT 41.6 04/29/2019 1002   HCT 36.4 02/21/2016 1035   PLT 206 04/29/2019 1002   PLT 257 02/21/2016 1035   MCV 93.1 04/29/2019 1002   MCV 87.4 02/21/2016 1035   MCH 31.1 04/29/2019 1002   MCHC 33.4 04/29/2019 1002   RDW 13.7 04/29/2019 1002   RDW 14.2 02/21/2016 1035   LYMPHSABS 2.3 04/29/2019 1002   LYMPHSABS 2.6 02/21/2016 1035   MONOABS 0.3 04/29/2019 1002   MONOABS 0.7 02/21/2016 1035   EOSABS 0.0 04/29/2019 1002   EOSABS 0.4 02/21/2016 1035   BASOSABS 0.0 04/29/2019 1002   BASOSABS 0.1 02/21/2016 1035    BMET    Component Value Date/Time   NA 137 04/29/2019 1002   NA 136 09/09/2018 1048   NA 138 02/21/2016 1035   K 4.1 04/29/2019 1002   K 4.5 02/21/2016 1035   CL 105 04/29/2019 1002   CL 106 04/10/2012 0921   CO2 21 (L) 04/29/2019 1002   CO2 24 02/21/2016 1035   GLUCOSE 199 (H) 04/29/2019 1002   GLUCOSE 194 (H) 02/21/2016 1035   GLUCOSE 216 (H) 04/10/2012 0921   BUN 29 (H) 04/29/2019 1002   BUN 32 (H) 09/09/2018 1048   BUN 18.7 02/21/2016 1035   CREATININE 1.46 (H) 04/29/2019 1002   CREATININE 1.4 (H) 02/21/2016 1035   CALCIUM 8.6 (L) 04/29/2019 1002   CALCIUM 9.0 02/21/2016 1035   GFRNONAA 35 (L) 04/29/2019 1002   GFRAA 40 (L) 04/29/2019 1002    INR    Component Value Date/Time   INR 0.88 09/10/2015 1030     Intake/Output Summary (Last 24 hours) at 04/30/2019 0758 Last data filed at 04/30/2019 0500 Gross per 24 hour  Intake 967.18 ml  Output 3 ml  Net 964.18 ml     Assessment/Plan:  79 y.o. female is s/p  1. Ultrasound-guided access of the left common femoral artery 2.  CO2 aortogram 3.  Right lower extremity arteriogram with selection of third order branches with CO2 and limited contrast 4.  Right SFA angioplasty with stent placement (predilated with a 4 mm Mustang, stented with 6 mm x 120 mm drug-coated Eluvia and 6 mm x 80 mm drug-coated Eluvia postdilated with a 5 mm Mustang) 5.  Mynx closure of the left common femoral artery  1 Day Post-Op for bilateral lower extremity limiting claudication. She was kept overnight due to hypotension and bradycardia during the intervention yesterday. This was resolved with atropine. She additionally had an event last night with chest pain that started in her abdomen and went into epigastric area.  She was seen by cardiology and was felt this was not ACS related. She was given a GI cocktail and her symptoms were resolved. She has felt well since. She has been up ambulating in room. Tolerating diet. She is very eager to go home. Cardiology assistance appreciated. She will likely be okay for discharge later today following cardiology follow up recommendations    [facility] [phone] 04/30/2019 7:58 AM   I have independently interviewed and examined patient and agree with PA assessment and plan above.  Cardiology evaluation much appreciated.  Patient doing well this morning left groin is soft right foot is warm.  Plan for discharge today follow-up with Dr. Carlis Abbott or PA in our office in a few weeks.  Nicie Milan C. Donzetta Matters, MD [facility] Office: [phone] Pager: [phone]

## 2019-04-30 NOTE — Progress Notes (Signed)
Pt/family given discharge instructions, medication lists, follow up appointments, and when to call the doctor.  Pt/family verbalizes understanding. Patient aware that she will resume metformin on 05/02/19 and ok to restart aspirin. Patient transported to main entrance via wheelchair by volunteers. Marland Kitchen

## 2019-04-30 NOTE — Discharge Summary (Signed)
[facility] Discharge Summary   Patient ID:  [name] MRN: [id number] DOB/AGE: 08-28-1940 79 y.o.  Admit date: 04/29/2019 Discharge date: 04/30/2019 Date of Surgery: 04/29/2019 Surgeon: Surgeon(s): Marty Heck, MD  Admission Diagnosis: PAD (peripheral artery disease) [facility]) [I73.9]  Discharge Diagnoses:  PAD (peripheral artery disease) ([place]) [I73.9]  Secondary Diagnoses: Past Medical History:  Diagnosis Date  . Allergy   . Anemia    childhood  . Anxiety   . Arterial stenosis (HCC)    mesenteric  . Arthritis   . Breast cancer ([place]) 12/05/10   R breast, inv mammary, in situ,ER/PR +,HER2 -  . Cancer ([place])   . Carcinoma of breast treated with adjuvant chemotherapy ([place])   . Coronary artery disease    stent 2007  . Depression   . Diabetes mellitus   . Difficulty sleeping   . Diverticulosis 12/10/03  . Elevated cholesterol   . Generalized weakness   . GERD (gastroesophageal reflux disease)   . Heart murmur    secondary to chem. 2013  . Hepatitis    as an infant  . HSV-1 (herpes simplex virus 1) infection   . Hyperlipidemia   . Hypertension   . Hypothyroidism   . Osteoarthritis   . Personal history of chemotherapy   . Personal history of radiation therapy   . Pneumonia    2014 september  . 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 Spinal stenosis   . Thyroid disease     Procedure(s): ABDOMINAL AORTOGRAM W/LOWER EXTREMITY PERIPHERAL VASCULAR INTERVENTION  Discharged Condition: good  HPI: [name] is a 79 y.o. female, with history of hypertension, hyperlipidemia, diabetes, coronary artery disease status post remote PCI with stent, breast cancer that presents for evaluation of PAD and bilateral lower extremity claudication. Patient states she has had pain in the right thigh and the left calf when walking that started about 3 to 4 months ago. This has gotten particularly more severe and states she can only walk  about 10 to 15 feet before she has to stop. She went to the grocery store earlier this week and states she had to almost lay on the floor on 3-4 different occasions. She denies any previous lower extremity interventions or bypasses. She does have a coronary stent. Denies any active tobacco abuse at this time and states she quit smoking years ago. She is very debilitated and cannot even work in her garden. She was being seen by Dr. Tonita Cong and he obtained some arterial vascular studies and sent her over for further evaluation. Her ABIs were 0.61 on the right and 0.60 on the left and monophasic at both ankles   Hospital Course:  [name] is a 79 y.o. female that presented on 04/29/19 with bilateral lower extremity short distance limiting claudication. She ultimately underwent a CO2 Aortogram, right lower extremity arteriogram with selection of third order branches with CO2 and limited contrast, right SFA angioplasty with stent placement by Dr. Carlis Abbott. During her aortogram while undergoing a right SFA angioplasty with stent placement she became bradycardic with a heart rate in the 20s and hypotensive with systolics in the 38V.  Very clammy.  Denied chest pain or shortness of breath.  She improved with atropine on the table. pacing pads were placed. She was subsequently admited for observation overnight after mynx closure of the left groin.  Cardiology was consulted due to her event and history of moderate AS and previous PCI for  coronary disease.  An EKG as well as troponins and other labs were ordered to ensure no further intervention required.   Day of surgery cardiology saw patient and suspect that event was vagally mediated however on rhythm strips she did have AV block. Reccommended telemetry monitoring over night with EP consult in the morning.  She did well most of the rest of the day. She remained at baseline and in sinus rhythm. Hemodynamically stable. Comfortable post intervention. Left groin  without hematoma. Late in the evening she did develop pain in upper abdomen/ epigastric region. EKG was obtained showing some non specific ST changes. Cardiology was called. GI cocktail was given. Her symptoms were resolved following GI cocktail.  Post procedure day 1 she did well over night with no recurrent episodes of chest pain, hypotension or bradycardia. She was tolerating diet and ambulating in room. EP saw patient and felt that her intra-procedural bradycardia was vagal- mediated. She may need pacemaker in the future which they discussed with her but they will continue to follow to see if recurrent events. Cardiology is okay for patient to discharge with lexiscan myovue scheduled for next week. She will go home on her Aspirin and Plavix. She will discharge today with follow up in the office with Dr. Carlis Abbott in 2-3 weeks  Procedure(s): ABDOMINAL AORTOGRAM W/LOWER EXTREMITY PERIPHERAL VASCULAR INTERVENTION Extubated: POD # 0 Post-op wounds clean, dry, intact or healing well Pt. Ambulating, voiding and taking PO diet without difficulty. Pt pain controlled with PO pain meds. Labs as below Complications:intra- procedural hypotension and bradycardia  Consults:  Treatment Team:  [facility], [place], MD Waynetta Sandy, MD  Significant Diagnostic Studies: CBC Lab Results  Component Value Date   WBC 5.9 04/29/2019   HGB 13.9 04/29/2019   HCT 41.6 04/29/2019   MCV 93.1 04/29/2019   PLT 206 04/29/2019    BMET    Component Value Date/Time   NA 137 04/29/2019 1002   NA 136 09/09/2018 1048   NA 138 02/21/2016 1035   K 4.1 04/29/2019 1002   K 4.5 02/21/2016 1035   CL 105 04/29/2019 1002   CL 106 04/10/2012 0921   CO2 21 (L) 04/29/2019 1002   CO2 24 02/21/2016 1035   GLUCOSE 199 (H) 04/29/2019 1002   GLUCOSE 194 (H) 02/21/2016 1035   GLUCOSE 216 (H) 04/10/2012 0921   BUN 29 (H) 04/29/2019 1002   BUN 32 (H) 09/09/2018 1048   BUN 18.7 02/21/2016 1035   CREATININE 1.46  (H) 04/29/2019 1002   CREATININE 1.4 (H) 02/21/2016 1035   CALCIUM 8.6 (L) 04/29/2019 1002   CALCIUM 9.0 02/21/2016 1035   GFRNONAA 35 (L) 04/29/2019 1002   GFRAA 40 (L) 04/29/2019 1002   COAG Lab Results  Component Value Date   INR 0.88 09/10/2015   INR 1.0 04/15/2013   INR 0.9 RATIO 03/28/2006    Disposition:  Discharge to :Home Discharge Instructions    Discharge patient   Complete by: As directed    Discharge disposition: 01-Home or Self Care   Discharge patient date: 04/30/2019   Nursing communication   Complete by: As directed    Patient should not restart her Metformin until 05/02/19     Allergies as of 04/30/2019      Reactions   Codeine Nausea And Vomiting, Other (See Comments)   Severe stomach cramps   Antihistamines, Diphenhydramine-type Other (See Comments)   Causes hyperactivity   Gabapentin    Urinary incontinence   Statins Other (See Comments)  Joint pains   Sulfa Antibiotics    Unknown to pt   Erythromycin Hives, Rash   Due to dental work about 50 years ago. Was used in packing and resulted in rash/hives inside and outside of mouth.      Medication List    TAKE these medications   Accu-Chek Guide test strip Generic drug: glucose blood TEST ONCE DAILY   Accu-Chek Multiclix Lancet Dev Kit Use as directed to check sugars   acetaminophen 500 MG tablet Commonly known as: TYLENOL Take 1,000 mg by mouth every 6 (six) hours as needed for moderate pain or headache.   aspirin 81 MG tablet Take 1 tablet (81 mg total) by mouth daily. Resume 4 days post-op   calcium carbonate 500 MG chewable tablet Commonly known as: TUMS - dosed in mg elemental calcium Chew 2 tablets by mouth 3 (three) times daily as needed for indigestion or heartburn.   chlorthalidone 25 MG tablet Commonly known as: HYGROTON TAKE 1/2 TABLET(12.5 MG) BY MOUTH EVERY DAY FOR 3 DAYS THEN TAKE 1 TABLET(25 MG) BY MOUTH EVERY DAY What changed:   how much to take  how to take  this  when to take this  reasons to take this  additional instructions   clobetasol cream 0.05 % Commonly known as: TEMOVATE Apply 1 application topically 2 (two) times daily. What changed:   when to take this  reasons to take this   clopidogrel 75 MG tablet Commonly known as: PLAVIX Take 1 tablet (75 mg total) by mouth daily with breakfast.   ferrous sulfate 325 (65 FE) MG tablet Commonly known as: FerrouSul Take 1 tablet (325 mg total) by mouth daily with breakfast.   glipiZIDE 10 MG 24 hr tablet Commonly known as: GLUCOTROL XL Take 1 tablet (10 mg total) by mouth daily with breakfast.   Januvia 100 MG tablet Generic drug: sitaGLIPtin TAKE 1 TABLET BY MOUTH  DAILY What changed: how much to take   Lancet Device Misc 100 each by Does not apply route daily. UAD for daily glucose monitoring   levothyroxine 75 MCG tablet Commonly known as: SYNTHROID TAKE 1 TABLET BY MOUTH ONCE DAILY AT 6 AM What changed: See the new instructions.   losartan 50 MG tablet Commonly known as: COZAAR TAKE 1 TABLET BY MOUTH  DAILY   metFORMIN 500 MG tablet Commonly known as: GLUCOPHAGE Take 1 tablet (500 mg total) by mouth daily with breakfast.   nitroGLYCERIN 0.4 MG SL tablet Commonly known as: NITROSTAT Place 1 tablet (0.4 mg total) under the tongue every 5 (five) minutes as needed for chest pain.   pantoprazole 40 MG tablet Commonly known as: PROTONIX TAKE 1 TABLET BY MOUTH  DAILY   PARoxetine 20 MG tablet Commonly known as: PAXIL TAKE 1 TABLET BY MOUTH EVERY DAY IN THE MORNING What changed:   how much to take  how to take this  when to take this  additional instructions   Trulicity 2.86 NO/1.7RN Sopn Generic drug: Dulaglutide Inject 0.75 mg into the skin once a week. What changed: when to take this   valACYclovir 1000 MG tablet Commonly known as: VALTREX TAKE 1 TABLET BY MOUTH 2  TIMES DAILY AS NEEDED FOR  VIRAL INFECTION. What changed: See the new  instructions.   vitamin B-12 500 MCG tablet Commonly known as: CYANOCOBALAMIN Take 500 mcg by mouth daily.   Vitamin D 50 MCG (2000 UT) Caps Take 2,000 Units by mouth daily.      Verbal and  written Discharge instructions given to the patient. Wound care per Discharge AVS Follow-up Information    Marty Heck, MD On 06/09/2019.   Specialty: Vascular Surgery Why: Office will call you to arrange your appt (sent) Contact information: [place] [address] [phone]           Signed: Karoline Caldwell 04/30/2019, 12:04 PM

## 2019-04-30 NOTE — Significant Event (Signed)
Rapid Response Event Note  Overview: Called d/t pt having 8/10 chest pain.   Initial Focused Assessment: Pt laying in bed with eyes closed. Pt in no distress. Pt awakens easily and is alert and oriented, c/o of 8/10 pain in upper abd/lower chest region. Lungs clear and diminished. Murmur present. Skin warm and dry. T-98, HR-91, BP-183/76, RR-21, SpO2-94% ON RA.   Interventions: EKG done prior to my arrival showing ST, L vent hypertrophy, and  non-specific ST-T changes.  Dr. Mardene Speak) to bedside: GI cocktail Plan of Care (if not transferred): EKG very similar to prior EKG. ???reflux??? Give GI cocktail and continue to monitor patient. Call RRT if further assistance needed.   Before leaving beside, pt said her pain was improving.  Event Summary:  [name]

## 2019-04-30 NOTE — Progress Notes (Signed)
At 11.40 pm, Pt complained having severe chest pain 8/10 scale, stated dull pain around upper epigastric area, no radiated pain, denied nausea and vomiting, but positive heartburn and bloating.   Sinus rhythm with left lateral ischemia on stat EKG 12 lead. Dr. Donzetta Matters notified, order received for Morphine PRN and referred to cardiologist.  Notified on-call cardiologist, Dr. Hassell Done and Mindy, RRT, evaluated Pt at bedside. Order received for GI cocktail, Dr. Hassell Done did not concern about acute MI, compaered to previous EKG. We held NTG and Labetalol at this moment and continued to monitor if GI Cocktail may possibly helpful for this situation. After GI Cocktail given, Pt stated pain was relieved.  BP 135/73 - 183/ 76 mmHg, RR 22-24, SPO2 98% on room air, HR 80s-90s. Temp 98.0 F orally.    Continue to monitor.  Kennyth Lose, RN

## 2019-05-05 ENCOUNTER — Other Ambulatory Visit: Payer: Self-pay

## 2019-05-06 ENCOUNTER — Encounter: Payer: Self-pay | Admitting: Family Medicine

## 2019-05-06 ENCOUNTER — Ambulatory Visit (INDEPENDENT_AMBULATORY_CARE_PROVIDER_SITE_OTHER): Payer: Medicare Other | Admitting: Family Medicine

## 2019-05-06 ENCOUNTER — Telehealth: Payer: Self-pay | Admitting: Family Medicine

## 2019-05-06 VITALS — BP 130/70 | HR 84 | Temp 96.3°F | Ht 63.0 in | Wt 148.2 lb

## 2019-05-06 DIAGNOSIS — E1140 Type 2 diabetes mellitus with diabetic neuropathy, unspecified: Secondary | ICD-10-CM | POA: Diagnosis not present

## 2019-05-06 DIAGNOSIS — E7800 Pure hypercholesterolemia, unspecified: Secondary | ICD-10-CM | POA: Diagnosis not present

## 2019-05-06 DIAGNOSIS — E039 Hypothyroidism, unspecified: Secondary | ICD-10-CM

## 2019-05-06 DIAGNOSIS — E1122 Type 2 diabetes mellitus with diabetic chronic kidney disease: Secondary | ICD-10-CM | POA: Diagnosis not present

## 2019-05-06 DIAGNOSIS — N184 Chronic kidney disease, stage 4 (severe): Secondary | ICD-10-CM

## 2019-05-06 DIAGNOSIS — I10 Essential (primary) hypertension: Secondary | ICD-10-CM | POA: Diagnosis not present

## 2019-05-06 LAB — LIPID PANEL
Cholesterol: 230 mg/dL — ABNORMAL HIGH (ref 0–200)
HDL: 34.7 mg/dL — ABNORMAL LOW (ref >39.00)
NonHDL: 194.86
Total CHOL/HDL Ratio: 7
Triglycerides: 223 mg/dL — ABNORMAL HIGH (ref 0.0–149.0)
VLDL: 44.6 mg/dL — ABNORMAL HIGH (ref 0.0–40.0)

## 2019-05-06 LAB — T4, FREE: Free T4: 0.97 ng/dL (ref 0.60–1.60)

## 2019-05-06 LAB — TSH: TSH: 7.21 u[IU]/mL — ABNORMAL HIGH (ref 0.35–4.50)

## 2019-05-06 LAB — LDL CHOLESTEROL, DIRECT: Direct LDL: 143 mg/dL

## 2019-05-06 NOTE — Telephone Encounter (Signed)
Patient called back and wanted to give DR. C some information about her Covid Vaccine. The first dose was 02/12/19. The code is [phone]. Second dose was 03/05/2019 and code was [id number]. Both doses were Pfizer and was administered at the [facility]. CB is [phone]

## 2019-05-06 NOTE — Telephone Encounter (Signed)
Noted in chart.

## 2019-05-06 NOTE — Progress Notes (Signed)
[name] is a 79 y.o. female  Chief Complaint  Patient presents with  . Establish Care    Pt here for TOC, Dm foloow up.  She has a form for Dr to discuss from her home health nurse.  Pt had 2nd dose of Covid vaccine, 03/05/2019. Pt would like to discuss medication, Rheumate capsules.    HPI: [name] is a 79 y.o. female here today for [facility] appt, previous PCP Dr. Deborra Medina. Pt is overdue for f/u on hypothyroidism. Last TSH in 10/20 was elevated, no changes to med, and f/u in 3 mo recommended. I do not see any TFTs since that time. Pt also due for DM f/u. Pt had A1C earlier this month and improved from 9.1 to 8.0. Pt is taking metformin 517m daily, glipizide XL 178m januiva, and trulicity. Home nurse did finger stick A1C = 7.3. She has RA, was on methotrexate and folic acid but was not able to tolerate folic acid d/t increased anxiety. She was then changed to Rheumate and she states this helped her BS. She would like to keep taking it. Last FLP done in 10/2018.   Past Medical History:  Diagnosis Date  . Allergy   . Anemia    childhood  . Anxiety   . Arterial stenosis (HCC)    mesenteric  . Arthritis   . Breast cancer ([facility]12   R breast, inv mammary, in situ,ER/PR +,HER2 -  . Cancer ([facility]  . Carcinoma of breast treated with adjuvant chemotherapy ([facility]  . Coronary artery disease    stent 2007  . Depression   . Diabetes mellitus   . Difficulty sleeping   . Diverticulosis 12/10/03  . Elevated cholesterol   . Generalized weakness   . GERD (gastroesophageal reflux disease)   . Heart murmur    secondary to chem. 2013  . Hepatitis    as an infant  . HSV-1 (herpes simplex virus 1) infection   . Hyperlipidemia   . Hypertension   . Hypothyroidism   . Osteoarthritis   . Personal history of chemotherapy   . Personal history of radiation therapy   . Pneumonia    2014 september  . 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 Spinal stenosis   .  Thyroid disease     Past Surgical History:  Procedure Laterality Date  . ABDOMINAL AORTOGRAM W/LOWER EXTREMITY Bilateral 04/29/2019   Procedure: ABDOMINAL AORTOGRAM W/LOWER EXTREMITY;  Surgeon: ClMarty HeckMD;  [facility];  Service: Cardiovascular;  Laterality: Bilateral;  . ANTERIOR AND POSTERIOR REPAIR N/A 04/05/2016   Procedure: ANTERIOR (CYSTOCELE) AND POSTERIOR REPAIR (RECTOCELE);  Surgeon: GrMarylynn PearsonMD;  [facility];  Service: Gynecology;  Laterality: N/A;  . BREAST BIOPSY    . BREAST LUMPECTOMY  1983   benign biopsy  . BREAST LUMPECTOMY Right 12/29/2010   snbx, ER/PR +, Her2 -, 0/1 node pos.  . CAROTID STENT  2007  . CARPAL TUNNEL RELEASE Bilateral 9/12,8,12  . CHOLECYSTECTOMY    . COLONOSCOPY N/A 02/09/2013   Procedure: COLONOSCOPY;  Surgeon: DoLafayette DragonMD;  [facility];  Service: Endoscopy;  Laterality: N/A;  . COLONOSCOPY    . CORONARY ANGIOPLASTY    . DILATION AND CURETTAGE OF UTERUS    . ESOPHAGOGASTRODUODENOSCOPY N/A 02/09/2013   Procedure: ESOPHAGOGASTRODUODENOSCOPY (EGD);  Surgeon: DoLafayette DragonMD;  [facility];  Service: Endoscopy;  Laterality: N/A;  . FOOT  SPURS    . HYSTEROSCOPY WITH D & C N/A 09/11/2012   Procedure: DILATATION AND CURETTAGE ;  Surgeon: [name], MD;  Location: [facility];  Service: Gynecology;  Laterality: N/A;  . KNEE SURGERY  2007  . LAPAROSCOPIC ASSISTED VAGINAL HYSTERECTOMY N/A 04/05/2016   Procedure: LAPAROSCOPIC ASSISTED VAGINAL HYSTERECTOMY possible BSO;  Surgeon: [name], MD;  Location: [facility];  Service: Gynecology;  Laterality: N/A;  . LUMBAR LAMINECTOMY/DECOMPRESSION MICRODISCECTOMY N/A 10/20/2014   Procedure: MICRO LUMBER DECOMPRESSION L3-4 L4-5;  Surgeon: [name], MD;  [facility];  Service: Orthopedics;  Laterality: N/A;  . LUMBAR LAMINECTOMY/DECOMPRESSION MICRODISCECTOMY Bilateral 10/13/2015   Procedure: MICRO LUMBAR DECOMPRESSION L5 - S1 AND REDO  DECOMPRESSION L4 - L5 AND REMOVAL OF FACET CYST L4 - L5 2 LEVELS;  Surgeon: [name], MD;  [facility]: [facility];  Service: Orthopedics;  Laterality: Bilateral;  . PERIPHERAL VASCULAR INTERVENTION Right 04/29/2019   Procedure: PERIPHERAL VASCULAR INTERVENTION;  Surgeon: Marty Heck, MD;  Location: [place] [facility];  Service: Cardiovascular;  Laterality: Right;  . PORT-A-CATH REMOVAL  04/17/2011   Procedure: REMOVAL PORT-A-CATH;  Surgeon: [name], MD;  Location: [facility];  Service: General;  Laterality: Left;  . PORTACATH PLACEMENT  02/07/2011   Procedure: INSERTION PORT-A-CATH;  Surgeon: [name], MD;  [facility];  Service: General;  Laterality: N/A;  . UPPER GASTROINTESTINAL ENDOSCOPY      Social History   Socioeconomic History  . Marital status: Married    Spouse name: Not on file  . Number of children: 2  . Years of education: Not on file  . Highest education level: Not on file  Occupational History  . Occupation: retired  Tobacco Use  . Smoking status: Former Smoker    Packs/day: 2.00    Years: 40.00    Pack years: 80.00    Quit date: 12/19/1997    Years since quitting: 21.3  . Smokeless tobacco: Never Used  Substance and Sexual Activity  . Alcohol use: Yes    Alcohol/week: 6.0 standard drinks    Types: 6 Glasses of wine per week    Comment: occasional  . Drug use: No  . Sexual activity: Not on file    Comment: menarch age 22, P3, HRT X 10 YRS, MENOPAUSE MID 40'S  Other Topics Concern  . Not on file  Social History Narrative   [name] age 33- [place] age 20     Social Determinants of Health   Financial Resource Strain:   . Difficulty of Paying Living Expenses:   Food Insecurity:   . Worried About Charity fundraiser in the Last Year:   . Arboriculturist in the Last Year:   Transportation Needs:   . Film/video editor (Medical):   Marland Kitchen Lack of Transportation (Non-Medical):   Physical  Activity:   . Days of Exercise per Week:   . Minutes of Exercise per Session:   Stress:   . Feeling of Stress :   Social Connections:   . Frequency of Communication with Friends and Family:   . Frequency of Social Gatherings with Friends and Family:   . Attends Religious Services:   . Active Member of Clubs or Organizations:   . Attends Archivist Meetings:   Marland Kitchen Marital Status:   Intimate Partner Violence:   . Fear of Current or Ex-Partner:   . Emotionally Abused:   Marland Kitchen Physically Abused:   . Sexually Abused:  Family History  Problem Relation Age of Onset  . Throat cancer Father   . Alcoholism Father   . Heart attack Father   . Heart disease Mother   . Lymphoma Brother 39  . Cancer Son   . Heart attack Brother   . Diabetes Brother   . Hypertension Brother   . Colon cancer Neg Hx   . Stroke Neg Hx   . Esophageal cancer Neg Hx   . Rectal cancer Neg Hx   . Stomach cancer Neg Hx      Immunization History  Administered Date(s) Administered  . Influenza Split 12/07/2014  . Influenza, High Dose Seasonal PF 11/26/2016  . Influenza,inj,quad, With Preservative 02/22/2017  . Influenza-Unspecified 10/31/2009, 12/01/2010, 12/03/2011, 11/05/2012, 11/05/2013, 10/25/2014, 12/07/2014, 11/10/2015, 11/27/2017  . PFIZER SARS-COV-2 Vaccination 02/12/2019, 03/05/2019  . Pneumococcal Conjugate-13 07/30/2014  . Pneumococcal Polysaccharide-23 11/19/2007, 11/15/2008  . Pneumococcal-Unspecified 01/23/2010  . Zoster Recombinat (Shingrix) 02/01/2011    Outpatient Encounter Medications as of 05/06/2019  Medication Sig Note  . acetaminophen (TYLENOL) 500 MG tablet Take 1,000 mg by mouth every 6 (six) hours as needed for moderate pain or headache.   Marland Kitchen aspirin 81 MG tablet Take 1 tablet (81 mg total) by mouth daily. Resume 4 days post-op   . calcium carbonate (TUMS - DOSED IN MG ELEMENTAL CALCIUM) 500 MG chewable tablet Chew 2 tablets by mouth 3 (three) times daily as needed for  indigestion or heartburn.   . Cholecalciferol (VITAMIN D) 2000 units CAPS Take 2,000 Units by mouth daily.    . clobetasol cream (TEMOVATE) 4.28 % Apply 1 application topically 2 (two) times daily. (Patient taking differently: Apply 1 application topically 2 (two) times daily as needed (itching). )   . clopidogrel (PLAVIX) 75 MG tablet Take 1 tablet (75 mg total) by mouth daily with breakfast.   . Dulaglutide (TRULICITY) 7.68 TL/5.7WI SOPN Inject 0.75 mg into the skin once a week. (Patient taking differently: Inject 0.75 mg into the skin every Saturday. )   . glipiZIDE (GLUCOTROL XL) 10 MG 24 hr tablet Take 1 tablet (10 mg total) by mouth daily with breakfast.   . glucose blood (ACCU-CHEK GUIDE) test strip TEST ONCE DAILY   . JANUVIA 100 MG tablet TAKE 1 TABLET BY MOUTH  DAILY (Patient taking differently: Take 100 mg by mouth daily. )   . Lancet Device MISC 100 each by Does not apply route daily. UAD for daily glucose monitoring   . Lancets Misc. (ACCU-CHEK MULTICLIX LANCET DEV) KIT Use as directed to check sugars   . levothyroxine (SYNTHROID) 75 MCG tablet TAKE 1 TABLET BY MOUTH ONCE DAILY AT 6 AM (Patient taking differently: Take 75 mcg by mouth daily before breakfast. )   . losartan (COZAAR) 50 MG tablet TAKE 1 TABLET BY MOUTH  DAILY (Patient taking differently: Take 50 mg by mouth daily. )   . metFORMIN (GLUCOPHAGE) 500 MG tablet Take 1 tablet (500 mg total) by mouth daily with breakfast.   . nitroGLYCERIN (NITROSTAT) 0.4 MG SL tablet Place 1 tablet (0.4 mg total) under the tongue every 5 (five) minutes as needed for chest pain.   . pantoprazole (PROTONIX) 40 MG tablet TAKE 1 TABLET BY MOUTH  DAILY (Patient taking differently: Take 40 mg by mouth daily. )   . PARoxetine (PAXIL) 20 MG tablet TAKE 1 TABLET BY MOUTH EVERY DAY IN THE MORNING (Patient taking differently: Take 20 mg by mouth daily. )   . valACYclovir (VALTREX) 1000 MG tablet TAKE 1  TABLET BY MOUTH 2  TIMES DAILY AS NEEDED FOR  VIRAL  INFECTION. (Patient taking differently: Take 1,000 mg by mouth 2 (two) times daily as needed (fever blisters). )   . vitamin B-12 (CYANOCOBALAMIN) 500 MCG tablet Take 500 mcg by mouth daily.   . chlorthalidone (HYGROTON) 25 MG tablet TAKE 1/2 TABLET(12.5 MG) BY MOUTH EVERY DAY FOR 3 DAYS THEN TAKE 1 TABLET(25 MG) BY MOUTH EVERY DAY (Patient not taking: Reported on 05/06/2019) 04/22/2019: Pt can tell when she starts getting a headache that's related to her high bp  . ferrous sulfate (FERROUSUL) 325 (65 FE) MG tablet Take 1 tablet (325 mg total) by mouth daily with breakfast. (Patient not taking: Reported on 04/22/2019)    No facility-administered encounter medications on file as of 05/06/2019.     ROS: Pertinent positives and negatives noted in HPI. Remainder of ROS non-contributory   Allergies  Allergen Reactions  . Codeine Nausea And Vomiting and Other (See Comments)    Severe stomach cramps  . Antihistamines, Diphenhydramine-Type Other (See Comments)    Causes hyperactivity  . Gabapentin     Urinary incontinence  . Statins Other (See Comments)    Joint pains  . Sulfa Antibiotics     Unknown to pt  . Erythromycin Hives and Rash    Due to dental work about 50 years ago. Was used in packing and resulted in rash/hives inside and outside of mouth.    BP 130/70 (BP Location: Left Arm, Patient Position: Sitting, Cuff Size: Normal)   Pulse 84   Temp (!) 96.3 F (35.7 C) (Temporal)   Ht '5\' 3"'  (1.6 m)   Wt 148 lb 3.2 oz (67.2 kg)   SpO2 98%   BMI 26.25 kg/m   Physical Exam  Constitutional: She is oriented to person, place, and time. She appears well-developed and well-nourished. No distress.  Cardiovascular: Normal rate and regular rhythm.  Pulmonary/Chest: Effort normal and breath sounds normal. No respiratory distress.  Musculoskeletal:        General: No edema.     Cervical back: Neck supple.  Neurological: She is alert and oriented to person, place, and time.  Skin: Skin is  warm and dry.  Psychiatric: She has a normal mood and affect. Her behavior is normal.     A/P:  1. Essential hypertension - controlled, at goal - cont current med  2. Type 2 diabetes mellitus with diabetic neuropathy, without long-term current use of insulin (HCC) - Last A1C in 04/2019 improved from 9.1 to 8.0 - cont current meds and f/u in 3 mo  3. 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HCC) - stable  4. HYPERCHOLESTEROLEMIA - Lipid panel  5. Hypothyroidism, unspecified type - on 85mg daily of levothyroxine but last TSH in 10/2019 was elevated  - TSH - T4, free  This visit occurred during the SARS-CoV-2 public health emergency.  Safety protocols were in place, including screening questions prior to the visit, additional usage of staff PPE, and extensive cleaning of exam room while observing appropriate contact time as indicated for disinfecting solutions.

## 2019-05-07 ENCOUNTER — Telehealth: Payer: Self-pay | Admitting: Family Medicine

## 2019-05-07 ENCOUNTER — Other Ambulatory Visit: Payer: Self-pay | Admitting: Family Medicine

## 2019-05-07 DIAGNOSIS — E7800 Pure hypercholesterolemia, unspecified: Secondary | ICD-10-CM

## 2019-05-07 DIAGNOSIS — E039 Hypothyroidism, unspecified: Secondary | ICD-10-CM

## 2019-05-07 MED ORDER — LEVOTHYROXINE SODIUM 88 MCG PO TABS: 88.0000 ug | ORAL_TABLET | Freq: Every day | ORAL | 3 refills | Status: DC

## 2019-05-07 MED ORDER — EZETIMIBE 10 MG PO TABS: 10.0000 mg | ORAL_TABLET | Freq: Every day | ORAL | 3 refills | Status: DC

## 2019-05-07 NOTE — Telephone Encounter (Signed)
Informed pt of lab results  

## 2019-05-07 NOTE — Telephone Encounter (Signed)
Pt calling back saying she was returning [place] call, please return call when able

## 2019-05-12 ENCOUNTER — Telehealth: Payer: Self-pay | Admitting: Family Medicine

## 2019-05-12 ENCOUNTER — Telehealth: Payer: Self-pay

## 2019-05-12 NOTE — Telephone Encounter (Signed)
Left message on voicemail to call office. Dr. Loletha Grayer sent in [place], 05/07/19, [place].

## 2019-05-12 NOTE — Telephone Encounter (Signed)
Pt called with c/o bruising on shin and a soreness/cramping feeling in R calf area since the day after procedure. She also feels she may have a "hematoma the size of an egg" at incision on L side. Pt is going to come in tomorrow morning to see PA. Pt is aware of this appt.

## 2019-05-12 NOTE — Telephone Encounter (Signed)
Patient called and stated that she ws suppose to have a prescription sent to the pharmacy for her cholesterol. Informed patient that Dr. Loletha Grayer wouldn't be back in the office until tomorrow. Pls advise. CB is [phone]

## 2019-05-13 ENCOUNTER — Ambulatory Visit (INDEPENDENT_AMBULATORY_CARE_PROVIDER_SITE_OTHER): Payer: Medicare Other | Admitting: Physician Assistant

## 2019-05-13 ENCOUNTER — Other Ambulatory Visit: Payer: Self-pay

## 2019-05-13 VITALS — BP 146/79 | HR 108 | Temp 97.0°F | Resp 18 | Ht 63.0 in | Wt 148.3 lb

## 2019-05-13 DIAGNOSIS — I739 Peripheral vascular disease, unspecified: Secondary | ICD-10-CM | POA: Diagnosis not present

## 2019-05-13 NOTE — Progress Notes (Signed)
Office Note     CC:  follow up Requesting Provider:  Ronnald Nian, DO  HPI: [name] is a 79 y.o. (05/05/1940) female who presents for follow up of her peripheral vascualr disease. She underwent a CO2 Aortogram on 04/29/19 by Dr. Carlis Abbott for right lower extremity claudication. She had right SFA angioplasty and stent placement with Eluvia. She ultimately became bradycardic on the table dropped her heart rate to 74Y with a systolic blood pressure of 40.  She was given atropine and ultimately her HR and BP came up.  It was elected to stop after SFA intervention given her clinical condition and the fact that she only has claudication.  She was be admitted overnight for serial labs, cardiac enzymes, and  Cardiology was consulted. Cardiology felt this was a vagally mediated event. Further symptoms were resolved with GI cocktail. She is scheduled to follow up with cardiology as outpatient for further workup and possibly myovue lexiscan  Since her discharge she has noticed continued cramping in right calf similar to what she had prior to admission. She describes it as the sort of feeling of soreness after you have had a cramp that continues to stay in the leg with tenderness to compress the calf. It is not worsened by ambulation. She has noticed some bruising along the front and back of the right leg as well. She otherwise denies any  wounds or rest pain in the right leg. She also has had a small hematoma in the left groin. She notes that it seems to be more tender now then when she left the hospital but it is tolerable  The pt is on a statin for cholesterol management.  The pt is on a daily aspirin.   Other AC: Plavix The pt is not on medication for hypertension.   The pt is diabetic.  Tobacco hx:  Former, quit 1999  Past Medical History:  Diagnosis Date  . Allergy   . Anemia    childhood  . Anxiety   . Arterial stenosis (HCC)    mesenteric  . Arthritis   . Breast cancer ([place]) 12/05/10     R breast, inv mammary, in situ,ER/PR +,HER2 -  . Cancer ([place])   . Carcinoma of breast treated with adjuvant chemotherapy ([place])   . Coronary artery disease    stent 2007  . Depression   . Diabetes mellitus   . Difficulty sleeping   . Diverticulosis 12/10/03  . Elevated cholesterol   . Generalized weakness   . GERD (gastroesophageal reflux disease)   . Heart murmur    secondary to chem. 2013  . Hepatitis    as an infant  . HSV-1 (herpes simplex virus 1) infection   . Hyperlipidemia   . Hypertension   . Hypothyroidism   . Osteoarthritis   . Personal history of chemotherapy   . Personal history of radiation therapy   . Pneumonia    2014 september  . 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 Spinal stenosis   . Thyroid disease     Past Surgical History:  Procedure Laterality Date  . ABDOMINAL AORTOGRAM W/LOWER EXTREMITY Bilateral 04/29/2019   Procedure: ABDOMINAL AORTOGRAM W/LOWER EXTREMITY;  Surgeon: Marty Heck, MD;  Location: [facility];  Service: Cardiovascular;  Laterality: Bilateral;  . ANTERIOR AND POSTERIOR REPAIR N/A 04/05/2016   Procedure: ANTERIOR (CYSTOCELE) AND POSTERIOR REPAIR (RECTOCELE);  Surgeon: [name], MD;  Location: [facility];  Service: Gynecology;  Laterality: N/A;  . BREAST BIOPSY    . BREAST LUMPECTOMY  1983   benign biopsy  . BREAST LUMPECTOMY Right 12/29/2010   snbx, ER/PR +, Her2 -, 0/1 node pos.  . CAROTID STENT  2007  . CARPAL TUNNEL RELEASE Bilateral 9/12,8,12  . CHOLECYSTECTOMY    . COLONOSCOPY N/A 02/09/2013   Procedure: COLONOSCOPY;  Surgeon: [name], MD;  Location: [facility];  Service: Endoscopy;  Laterality: N/A;  . COLONOSCOPY    . CORONARY ANGIOPLASTY    . DILATION AND CURETTAGE OF UTERUS    . ESOPHAGOGASTRODUODENOSCOPY N/A 02/09/2013   Procedure: ESOPHAGOGASTRODUODENOSCOPY (EGD);  Surgeon: [name], MD;  Location: [name];  Service: Endoscopy;  Laterality: N/A;  . FOOT SPURS    .  HYSTEROSCOPY WITH D & C N/A 09/11/2012   Procedure: DILATATION AND CURETTAGE ;  Surgeon: [name], MD;  Location: [facility];  Service: Gynecology;  Laterality: N/A;  . KNEE SURGERY  2007  . LAPAROSCOPIC ASSISTED VAGINAL HYSTERECTOMY N/A 04/05/2016   Procedure: LAPAROSCOPIC ASSISTED VAGINAL HYSTERECTOMY possible BSO;  Surgeon: [name], MD;  Location: [facility];  Service: Gynecology;  Laterality: N/A;  . LUMBAR LAMINECTOMY/DECOMPRESSION MICRODISCECTOMY N/A 10/20/2014   Procedure: MICRO LUMBER DECOMPRESSION L3-4 L4-5;  Surgeon: [name], MD;  Location: [facility];  Service: Orthopedics;  Laterality: N/A;  . LUMBAR LAMINECTOMY/DECOMPRESSION MICRODISCECTOMY Bilateral 10/13/2015   Procedure: MICRO LUMBAR DECOMPRESSION L5 - S1 AND REDO DECOMPRESSION L4 - L5 AND REMOVAL OF FACET CYST L4 - L5 2 LEVELS;  Surgeon: [name], MD;  Location: [facility];  Service: Orthopedics;  Laterality: Bilateral;  . PERIPHERAL VASCULAR INTERVENTION Right 04/29/2019   Procedure: PERIPHERAL VASCULAR INTERVENTION;  Surgeon: Marty Heck, MD;  Location: [facility];  Service: Cardiovascular;  Laterality: Right;  . PORT-A-CATH REMOVAL  04/17/2011   Procedure: REMOVAL PORT-A-CATH;  Surgeon: [name], MD;  Location: [facility];  Service: General;  Laterality: Left;  . PORTACATH PLACEMENT  02/07/2011   Procedure: INSERTION PORT-A-CATH;  Surgeon: [name], MD;  Location: [facility];  Service: General;  Laterality: N/A;  . UPPER GASTROINTESTINAL ENDOSCOPY      Social History   Socioeconomic History  . Marital status: Married    Spouse name: Not on file  . Number of children: 2  . Years of education: Not on file  . Highest education level: Not on file  Occupational History  . Occupation: retired  Tobacco Use  . Smoking status: Former Smoker    Packs/day: 2.00    Years: 40.00    Pack years: 80.00    Quit date: 12/19/1997    Years since quitting: 21.4  . Smokeless tobacco:  Never Used  Substance and Sexual Activity  . Alcohol use: Yes    Alcohol/week: 6.0 standard drinks    Types: 6 Glasses of wine per week    Comment: occasional  . Drug use: No  . 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 Not on file  Social History Narrative   [name] age 74- [place] age 43  [place]   Social Determinants of Health   Financial Resource Strain:   . Difficulty of Paying Living Expenses:   Food Insecurity:   . Worried About Charity fundraiser in the Last Year:   . Arboriculturist in the Last Year:   Transportation Needs:   .  Lack of Transportation (Medical):   Marland Kitchen Lack of Transportation (Non-Medical):   Physical Activity:   . Days of Exercise per Week:   . Minutes of Exercise per Session:   Stress:   . Feeling of Stress :   Social Connections:   . Frequency of Communication with Friends and Family:   . Frequency of Social Gatherings with Friends and Family:   . Attends Religious Services:   . Active Member of Clubs or Organizations:   . Attends Archivist Meetings:   Marland Kitchen Marital Status:   Intimate Partner Violence:   . Fear of Current or Ex-Partner:   . Emotionally Abused:   Marland Kitchen Physically Abused:   . Sexually Abused:     Family History  Problem Relation Age of Onset  . Throat cancer Father   . Alcoholism Father   . Heart attack Father   . Heart disease Mother   . Lymphoma Brother 87  . Cancer Son   . Heart attack Brother   . Diabetes Brother   . Hypertension Brother   . Colon cancer Neg Hx   . Stroke Neg Hx   . Esophageal cancer Neg Hx   . Rectal cancer Neg Hx   . Stomach cancer Neg Hx     Current Outpatient Medications  Medication Sig Dispense Refill  . acetaminophen (TYLENOL) 500 MG tablet Take 1,000 mg by mouth every 6 (six) hours as needed for moderate pain or headache.    Marland Kitchen aspirin 81 MG tablet Take 1 tablet (81 mg total) by mouth daily.  Resume 4 days post-op    . calcium carbonate (TUMS - DOSED IN MG ELEMENTAL CALCIUM) 500 MG chewable tablet Chew 2 tablets by mouth 3 (three) times daily as needed for indigestion or heartburn.    . chlorthalidone (HYGROTON) 25 MG tablet TAKE 1/2 TABLET(12.5 MG) BY MOUTH EVERY DAY FOR 3 DAYS THEN TAKE 1 TABLET(25 MG) BY MOUTH EVERY DAY 90 tablet 0  . Cholecalciferol (VITAMIN D) 2000 units CAPS Take 2,000 Units by mouth daily.     . clobetasol cream (TEMOVATE) 6.96 % Apply 1 application topically 2 (two) times daily. (Patient taking differently: Apply 1 application topically 2 (two) times daily as needed (itching). ) 30 g 0  . clopidogrel (PLAVIX) 75 MG tablet Take 1 tablet (75 mg total) by mouth daily with breakfast. 30 tablet 6  . Dulaglutide (TRULICITY) 7.89 FY/1.0FB SOPN Inject 0.75 mg into the skin once a week. (Patient taking differently: Inject 0.75 mg into the skin every Saturday. ) 1 pen 3  . ezetimibe (ZETIA) 10 MG tablet Take 1 tablet (10 mg total) by mouth daily. 90 tablet 3  . ferrous sulfate (FERROUSUL) 325 (65 FE) MG tablet Take 1 tablet (325 mg total) by mouth daily with breakfast. 30 tablet 3  . glipiZIDE (GLUCOTROL XL) 10 MG 24 hr tablet Take 1 tablet (10 mg total) by mouth daily with breakfast. 90 tablet 3  . glucose blood (ACCU-CHEK GUIDE) test strip TEST ONCE DAILY 100 each 5  . JANUVIA 100 MG tablet TAKE 1 TABLET BY MOUTH  DAILY (Patient taking differently: Take 100 mg by mouth daily. ) 90 tablet 3  . Lancet Device MISC 100 each by Does not apply route daily. UAD for daily glucose monitoring 100 each PRN  . Lancets Misc. (ACCU-CHEK MULTICLIX LANCET DEV) KIT Use as directed to check sugars    . levothyroxine (SYNTHROID) 88 MCG tablet Take 1 tablet (88 mcg total) by mouth daily.  90 tablet 3  . losartan (COZAAR) 50 MG tablet TAKE 1 TABLET BY MOUTH  DAILY (Patient taking differently: Take 50 mg by mouth daily. ) 90 tablet 3  . metFORMIN (GLUCOPHAGE) 500 MG tablet Take 1 tablet (500 mg  total) by mouth daily with breakfast. 90 tablet 3  . nitroGLYCERIN (NITROSTAT) 0.4 MG SL tablet Place 1 tablet (0.4 mg total) under the tongue every 5 (five) minutes as needed for chest pain. 25 tablet 3  . pantoprazole (PROTONIX) 40 MG tablet TAKE 1 TABLET BY MOUTH  DAILY (Patient taking differently: Take 40 mg by mouth daily. ) 90 tablet 1  . PARoxetine (PAXIL) 20 MG tablet TAKE 1 TABLET BY MOUTH EVERY DAY IN THE MORNING (Patient taking differently: Take 20 mg by mouth daily. ) 90 tablet 1  . valACYclovir (VALTREX) 1000 MG tablet TAKE 1 TABLET BY MOUTH 2  TIMES DAILY AS NEEDED FOR  VIRAL INFECTION. (Patient taking differently: Take 1,000 mg by mouth 2 (two) times daily as needed (fever blisters). ) 180 tablet 0  . vitamin B-12 (CYANOCOBALAMIN) 500 MCG tablet Take 500 mcg by mouth daily.     No current facility-administered medications for this visit.    Allergies  Allergen Reactions  . Codeine Nausea And Vomiting and Other (See Comments)    Severe stomach cramps  . Antihistamines, Diphenhydramine-Type Other (See Comments)    Causes hyperactivity  . Gabapentin     Urinary incontinence  . Statins Other (See Comments)    Joint pains  . Sulfa Antibiotics     Unknown to pt  . Erythromycin Hives and Rash    Due to dental work about 50 years ago. Was used in packing and resulted in rash/hives inside and outside of mouth.     REVIEW OF SYSTEMS:  Review of Systems  Constitutional: Negative for chills, fever and malaise/fatigue.  HENT: Negative for sore throat.   Eyes: Negative for blurred vision.  Respiratory: Negative for cough and shortness of breath.   Cardiovascular: Negative for chest pain, palpitations and leg swelling.  Gastrointestinal: Negative for abdominal pain, blood in stool, constipation, diarrhea, heartburn, nausea and vomiting.  Musculoskeletal: Negative for myalgias.  Neurological: Positive for dizziness. Negative for focal weakness and headaches.  Endo/Heme/Allergies:  Does not bruise/bleed easily.     PHYSICAL EXAMINATION:  Vitals:   05/13/19 1033  BP: (!) 146/79  Pulse: (!) 108  Resp: 18  Temp: (!) 97 F (36.1 C)  SpO2: 96%  Weight: 148 lb 4.8 oz (67.3 kg)  Height: '5\' 3"'  (1.6 m)    General:  WDWN in NAD; vital signs documented above Gait: normal HENT: WNL, normocephalic Pulmonary: normal non-labored breathing , without Rales, rhonchi,  wheezing Cardiac: regular HR, without  Murmurs without carotid bruit Abdomen: soft, NT, no masses Skin: without rashes Vascular Exam/Pulses:2+ bilateral femoral pulses. Left femoral access site with small hematoma. Slightly tender. No pulsatile mass. Mild surrounding ecchymosis. Right popliteal pulse 2+, right DP 1+ palpable, no palpable PT. Right foot warm. Left foot warm with palpable DP Extremities: without ischemic changes, without Gangrene , without cellulitis; without open wounds; mild ecchymosis along anterior right leg and small area posteromedial right leg, tender Musculoskeletal: no muscle wasting or atrophy  Neurologic: A&O X 3;  No focal weakness or paresthesias are detected Psychiatric:  The pt has Normal affect.   ASSESSMENT/PLAN:: 79 y.o. female here for follow up of peripheral vascular disease s/p CO2 aortogram with right lower extremity arteriogram with selection of third order  branches with CO2 and limited contrast, Right SFA angioplasty with stent placement (predilated with a 4 mm Mustang, stented with 6 mm x 120 mm drug-coated Eluvia and 6 mm x 80 mm drug-coated Eluvia postdilated with a 5 mm Mustang). Mynx closure of the left common femoral artery. She is doing well post intervention. She does still have right lower extremity claudication symptoms. She will re discuss with Dr. Carlis Abbott further intervention plans at her next follow up. At this time with non disabling claudication I would not recommend aggressive attempts at revascularization after her recent event. Patient understands and is  agreeable to this plan - Follow up with Cardiology for further cardiac workup  - she will continue her Aspirin, Plavix and statin and continued mobilization  - she has a small hematoma in left groin recommend warm compresses twice daily - I have advised her to call for earlier follow up if she has increased swelling, redness, pain or drainage from left groin or if she has worsening right lower extremity claudication symptoms - she will keep her follow up with Dr. Carlis Abbott on 06/09/19   Karoline Caldwell, PA-C [facility] [phone]  Clinic MD:  Dr. Oneida Alar

## 2019-05-15 ENCOUNTER — Telehealth: Payer: Self-pay | Admitting: *Deleted

## 2019-05-15 NOTE — Telephone Encounter (Signed)
Patient called stating she was having scant amount of drainage from the aortogram site.  She has been applying heat. Spoke with [employer] PA .  Patient was instructed to call the office in the event of swelling of the area.  Patient voiced understanding of the instructions.

## 2019-05-25 ENCOUNTER — Ambulatory Visit (HOSPITAL_COMMUNITY)
Admission: RE | Admit: 2019-05-25 | Discharge: 2019-05-25 | Disposition: A | Discharge status: Home / Self Care | Payer: Medicare Other | Source: Ambulatory Visit | Attending: Cardiology | Admitting: Cardiology

## 2019-05-25 ENCOUNTER — Other Ambulatory Visit: Payer: Self-pay | Admitting: Cardiovascular Disease

## 2019-05-25 ENCOUNTER — Other Ambulatory Visit: Payer: Self-pay

## 2019-05-25 DIAGNOSIS — I350 Nonrheumatic aortic (valve) stenosis: Secondary | ICD-10-CM | POA: Insufficient documentation

## 2019-05-25 DIAGNOSIS — I779 Disorder of arteries and arterioles, unspecified: Secondary | ICD-10-CM | POA: Insufficient documentation

## 2019-06-02 ENCOUNTER — Other Ambulatory Visit: Payer: Self-pay

## 2019-06-02 DIAGNOSIS — I6522 Occlusion and stenosis of left carotid artery: Secondary | ICD-10-CM

## 2019-06-05 ENCOUNTER — Other Ambulatory Visit: Payer: Self-pay | Admitting: *Deleted

## 2019-06-05 DIAGNOSIS — I739 Peripheral vascular disease, unspecified: Secondary | ICD-10-CM

## 2019-06-09 ENCOUNTER — Other Ambulatory Visit: Payer: Self-pay

## 2019-06-09 ENCOUNTER — Ambulatory Visit (HOSPITAL_COMMUNITY)
Admission: RE | Admit: 2019-06-09 | Discharge: 2019-06-09 | Disposition: A | Discharge status: Home / Self Care | Payer: Medicare Other | Source: Ambulatory Visit | Attending: Vascular Surgery | Admitting: Vascular Surgery

## 2019-06-09 ENCOUNTER — Ambulatory Visit (INDEPENDENT_AMBULATORY_CARE_PROVIDER_SITE_OTHER)
Admit: 2019-06-09 | Discharge: 2019-06-09 | Disposition: A | Discharge status: Home / Self Care | Payer: Medicare Other | Attending: Vascular Surgery | Admitting: Vascular Surgery

## 2019-06-09 ENCOUNTER — Ambulatory Visit (INDEPENDENT_AMBULATORY_CARE_PROVIDER_SITE_OTHER): Payer: Medicare Other | Admitting: Vascular Surgery

## 2019-06-09 ENCOUNTER — Encounter: Payer: Self-pay | Admitting: Vascular Surgery

## 2019-06-09 VITALS — BP 158/79 | HR 92 | Temp 97.3°F | Resp 14 | Ht 63.0 in | Wt 147.0 lb

## 2019-06-09 DIAGNOSIS — I739 Peripheral vascular disease, unspecified: Secondary | ICD-10-CM

## 2019-06-09 NOTE — Progress Notes (Signed)
Patient name: [name] MRN: [id number] DOB: [birth year] Sex: female  REASON FOR CONSULT: Follow-up after after right lower extremity intervention for claudication  HPI: [name] is a 79 y.o. female, with history of hypertension, hyperlipidemia, diabetes, coronary artery disease status post remote PCI with stent, breast cancer that presents for follow-up after right leg intervention for bilateral lower extremity lifestyle limiting claudication.    Ultimately she was taken to the Cath Lab and a right lower extremity angio was performed with stent of her SFA x 2 drug-coated Eluvia on 04/29/2019.  Ultimately she became bradycardic during the event had to get atropine and we stopped intervention after SFA stents placed.  She did have about a 50% popliteal stenosis in that leg that we did not treat since it did not appear flow-limiting and she had also become unstable with her bradycardia.  On follow-up today feels her right leg is about 50 to 60% better.  She wants to proceed with her left leg now and states cardiology has cleared her and told her she does not need a pacemaker.  Note indicate likely vagal event.  Prior to intervention she had 3 to 4 months of claudication symptoms and could only walk about 10 to 15 feet prior to intervention.    Past Medical History:  Diagnosis Date  . Allergy   . Anemia    childhood  . Anxiety   . Arterial stenosis (HCC)    mesenteric  . Arthritis   . Breast cancer ([place]) 12/05/10   R breast, inv mammary, in situ,ER/PR +,HER2 -  . Cancer ([place])   . Carcinoma of breast treated with adjuvant chemotherapy ([place])   . Coronary artery disease    stent 2007  . Depression   . Diabetes mellitus   . Difficulty sleeping   . Diverticulosis 12/10/03  . Elevated cholesterol   . Generalized weakness   . GERD (gastroesophageal reflux disease)   . Heart murmur    secondary to chem. 2013  . Hepatitis    as an infant  . HSV-1 (herpes simplex virus 1)  infection   . Hyperlipidemia   . Hypertension   . Hypothyroidism   . Osteoarthritis   . Personal history of chemotherapy   . Personal history of radiation therapy   . Pneumonia    2014 september  . 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 Spinal stenosis   . Thyroid disease     Past Surgical History:  Procedure Laterality Date  . ABDOMINAL AORTOGRAM W/LOWER EXTREMITY Bilateral 04/29/2019   Procedure: ABDOMINAL AORTOGRAM W/LOWER EXTREMITY;  Surgeon: Marty Heck, MD;  Location: [facility];  Service: Cardiovascular;  Laterality: Bilateral;  . ANTERIOR AND POSTERIOR REPAIR N/A 04/05/2016   Procedure: ANTERIOR (CYSTOCELE) AND POSTERIOR REPAIR (RECTOCELE);  Surgeon: [name], MD;  Location: [facility];  Service: Gynecology;  Laterality: N/A;  . BREAST BIOPSY    . BREAST LUMPECTOMY  1983   benign biopsy  . BREAST LUMPECTOMY Right 12/29/2010   snbx, ER/PR +, Her2 -, 0/1 node pos.  . CAROTID STENT  2007  . CARPAL TUNNEL RELEASE Bilateral 9/12,8,12  . CHOLECYSTECTOMY    . COLONOSCOPY N/A 02/09/2013   Procedure: COLONOSCOPY;  Surgeon: [name], MD;  Location: [facility];  Service: Endoscopy;  Laterality: N/A;  . COLONOSCOPY    . CORONARY ANGIOPLASTY    . DILATION AND CURETTAGE OF UTERUS    . ESOPHAGOGASTRODUODENOSCOPY N/A 02/09/2013  Procedure: ESOPHAGOGASTRODUODENOSCOPY (EGD);  Surgeon: [name], MD;  Location: [name];  Service: Endoscopy;  Laterality: N/A;  . FOOT SPURS    . HYSTEROSCOPY WITH D & C N/A 09/11/2012   Procedure: DILATATION AND CURETTAGE ;  Surgeon: [name], MD;  Location: [facility];  Service: Gynecology;  Laterality: N/A;  . KNEE SURGERY  2007  . LAPAROSCOPIC ASSISTED VAGINAL HYSTERECTOMY N/A 04/05/2016   Procedure: LAPAROSCOPIC ASSISTED VAGINAL HYSTERECTOMY possible BSO;  Surgeon: [name], MD;  Location: [facility];  Service: Gynecology;  Laterality: N/A;  . LUMBAR LAMINECTOMY/DECOMPRESSION MICRODISCECTOMY N/A  10/20/2014   Procedure: MICRO LUMBER DECOMPRESSION L3-4 L4-5;  Surgeon: [name], MD;  Location: [facility];  Service: Orthopedics;  Laterality: N/A;  . LUMBAR LAMINECTOMY/DECOMPRESSION MICRODISCECTOMY Bilateral 10/13/2015   Procedure: MICRO LUMBAR DECOMPRESSION L5 - S1 AND REDO DECOMPRESSION L4 - L5 AND REMOVAL OF FACET CYST L4 - L5 2 LEVELS;  Surgeon: [name], MD;  Location: [facility];  Service: Orthopedics;  Laterality: Bilateral;  . PERIPHERAL VASCULAR INTERVENTION Right 04/29/2019   Procedure: PERIPHERAL VASCULAR INTERVENTION;  Surgeon: Marty Heck, MD;  Location: [facility];  Service: Cardiovascular;  Laterality: Right;  . PORT-A-CATH REMOVAL  04/17/2011   Procedure: REMOVAL PORT-A-CATH;  Surgeon: [name], MD;  Location: [facility];  Service: General;  Laterality: Left;  . PORTACATH PLACEMENT  02/07/2011   Procedure: INSERTION PORT-A-CATH;  Surgeon: [name], MD;  Location: [facility];  Service: General;  Laterality: N/A;  . UPPER GASTROINTESTINAL ENDOSCOPY      Family History  Problem Relation Age of Onset  . Throat cancer Father   . Alcoholism Father   . Heart attack Father   . Heart disease Mother   . Lymphoma Brother 81  . Cancer Son   . Heart attack Brother   . Diabetes Brother   . Hypertension Brother   . Colon cancer Neg Hx   . Stroke Neg Hx   . Esophageal cancer Neg Hx   . Rectal cancer Neg Hx   . Stomach cancer Neg Hx     SOCIAL HISTORY: Social History   Socioeconomic History  . Marital status: Married    Spouse name: Not on file  . Number of children: 2  . Years of education: Not on file  . Highest education level: Not on file  Occupational History  . Occupation: retired  Tobacco Use  . Smoking status: Former Smoker    Packs/day: 2.00    Years: 40.00    Pack years: 80.00    Quit date: 12/19/1997    Years since quitting: 21.4  . Smokeless tobacco: Never Used  Substance and Sexual Activity  . Alcohol use:  Yes    Alcohol/week: 6.0 standard drinks    Types: 6 Glasses of wine per week    Comment: occasional  . Drug use: No  . Sexual activity: Not on file    Comment: menarch age 5, P3, HRT X 10 YRS, MENOPAUSE MID 40'S  Other Topics Concern  . Not on file  Social History Narrative   [name] age 89- [place] age 59  [place]   Social Determinants of Health   Financial Resource Strain:   . Difficulty of Paying Living Expenses:   Food Insecurity:   . Worried About Charity fundraiser in the Last Year:   . Arboriculturist in the Last Year:   Transportation Needs:   . Film/video editor (Medical):   Marland Kitchen  Lack of Transportation (Non-Medical):   Physical Activity:   . Days of Exercise per Week:   . Minutes of Exercise per Session:   Stress:   . Feeling of Stress :   Social Connections:   . Frequency of Communication with Friends and Family:   . Frequency of Social Gatherings with Friends and Family:   . Attends Religious Services:   . Active Member of Clubs or Organizations:   . Attends Archivist Meetings:   Marland Kitchen Marital Status:   Intimate Partner Violence:   . Fear of Current or Ex-Partner:   . Emotionally Abused:   Marland Kitchen Physically Abused:   . Sexually Abused:     Allergies  Allergen Reactions  . Codeine Nausea And Vomiting and Other (See Comments)    Severe stomach cramps  . Antihistamines, Diphenhydramine-Type Other (See Comments)    Causes hyperactivity  . Gabapentin     Urinary incontinence  . Statins Other (See Comments)    Joint pains  . Sulfa Antibiotics     Unknown to pt  . Erythromycin Hives and Rash    Due to dental work about 50 years ago. Was used in packing and resulted in rash/hives inside and outside of mouth.    Current Outpatient Medications  Medication Sig Dispense Refill  . acetaminophen (TYLENOL) 500 MG tablet Take 1,000 mg by mouth every 6 (six) hours as needed for moderate pain or headache.    Marland Kitchen aspirin 81 MG tablet Take 1  tablet (81 mg total) by mouth daily. Resume 4 days post-op    . calcium carbonate (TUMS - DOSED IN MG ELEMENTAL CALCIUM) 500 MG chewable tablet Chew 2 tablets by mouth 3 (three) times daily as needed for indigestion or heartburn.    . chlorthalidone (HYGROTON) 25 MG tablet TAKE 1/2 TABLET(12.5 MG) BY MOUTH EVERY DAY FOR 3 DAYS THEN TAKE 1 TABLET(25 MG) BY MOUTH EVERY DAY 90 tablet 0  . Cholecalciferol (VITAMIN D) 2000 units CAPS Take 2,000 Units by mouth daily.     . clobetasol cream (TEMOVATE) 5.36 % Apply 1 application topically 2 (two) times daily. (Patient taking differently: Apply 1 application topically 2 (two) times daily as needed (itching). ) 30 g 0  . clopidogrel (PLAVIX) 75 MG tablet Take 1 tablet (75 mg total) by mouth daily with breakfast. 30 tablet 6  . Dulaglutide (TRULICITY) 1.44 RX/5.4MG SOPN Inject 0.75 mg into the skin once a week. (Patient taking differently: Inject 0.75 mg into the skin every Saturday. ) 1 pen 3  . ezetimibe (ZETIA) 10 MG tablet Take 1 tablet (10 mg total) by mouth daily. 90 tablet 3  . ferrous sulfate (FERROUSUL) 325 (65 FE) MG tablet Take 1 tablet (325 mg total) by mouth daily with breakfast. 30 tablet 3  . glipiZIDE (GLUCOTROL XL) 10 MG 24 hr tablet Take 1 tablet (10 mg total) by mouth daily with breakfast. 90 tablet 3  . glucose blood (ACCU-CHEK GUIDE) test strip TEST ONCE DAILY 100 each 5  . JANUVIA 100 MG tablet TAKE 1 TABLET BY MOUTH  DAILY (Patient taking differently: Take 100 mg by mouth daily. ) 90 tablet 3  . Lancet Device MISC 100 each by Does not apply route daily. UAD for daily glucose monitoring 100 each PRN  . Lancets Misc. (ACCU-CHEK MULTICLIX LANCET DEV) KIT Use as directed to check sugars    . levothyroxine (SYNTHROID) 88 MCG tablet Take 1 tablet (88 mcg total) by mouth daily. 90 tablet 3  .  losartan (COZAAR) 50 MG tablet TAKE 1 TABLET BY MOUTH  DAILY (Patient taking differently: Take 50 mg by mouth daily. ) 90 tablet 3  . metFORMIN  (GLUCOPHAGE) 500 MG tablet Take 1 tablet (500 mg total) by mouth daily with breakfast. 90 tablet 3  . nitroGLYCERIN (NITROSTAT) 0.4 MG SL tablet Place 1 tablet (0.4 mg total) under the tongue every 5 (five) minutes as needed for chest pain. 25 tablet 3  . pantoprazole (PROTONIX) 40 MG tablet TAKE 1 TABLET BY MOUTH  DAILY (Patient taking differently: Take 40 mg by mouth daily. ) 90 tablet 1  . PARoxetine (PAXIL) 20 MG tablet TAKE 1 TABLET BY MOUTH EVERY DAY IN THE MORNING (Patient taking differently: Take 20 mg by mouth daily. ) 90 tablet 1  . valACYclovir (VALTREX) 1000 MG tablet TAKE 1 TABLET BY MOUTH 2  TIMES DAILY AS NEEDED FOR  VIRAL INFECTION. (Patient taking differently: Take 1,000 mg by mouth 2 (two) times daily as needed (fever blisters). ) 180 tablet 0  . vitamin B-12 (CYANOCOBALAMIN) 500 MCG tablet Take 500 mcg by mouth daily.     No current facility-administered medications for this visit.    REVIEW OF SYSTEMS:  '[X]'  denotes positive finding, '[ ]'  denotes negative finding Cardiac  Comments:  Chest pain or chest pressure:    Shortness of breath upon exertion:    Short of breath when lying flat:    Irregular heart rhythm:        Vascular    Pain in calf, thigh, or hip brought on by ambulation:    Pain in feet at night that wakes you up from your sleep:     Blood clot in your veins:    Leg swelling:         Pulmonary    Oxygen at home:    Productive cough:     Wheezing:         Neurologic    Sudden weakness in arms or legs:     Sudden numbness in arms or legs:     Sudden onset of difficulty speaking or slurred speech:    Temporary loss of vision in one eye:     Problems with dizziness:         Gastrointestinal    Blood in stool:     Vomited blood:         Genitourinary    Burning when urinating:     Blood in urine:        Psychiatric    Major depression:         Hematologic    Bleeding problems:    Problems with blood clotting too easily:        Skin      Rashes or ulcers:        Constitutional    Fever or chills:      PHYSICAL EXAM: Vitals:   06/09/19 1522  BP: (!) 158/79  Pulse: 92  Resp: 14  Temp: (!) 97.3 F (36.3 C)  TempSrc: Temporal  SpO2: 97%  Weight: 147 lb (66.7 kg)  Height: '5\' 3"'  (1.6 m)    GENERAL: The patient is a well-nourished female, in no acute distress. The vital signs are documented above. CARDIAC: There is a regular rate and rhythm.  VASCULAR:  Palpable femoral pulses bilaterally PULMONARY: There is good air exchange bilaterally without wheezing or rales. ABDOMEN: Soft and non-tender with normal pitched bowel sounds.  MUSCULOSKELETAL: There are no major deformities or cyanosis.  NEUROLOGIC: No focal weakness or paresthesias are detected.   DATA:   Right leg arterial duplex from today shows patent right SFA stents with about a 50% popliteal stenosis distal to the stent that was identified on arteriogram.  Left lower extremity arterial duplex from last month showed a high-grade stenosis of the distal SFA and high grade stenosis in the TP trunk and PT.   Assessment/Plan:  79 year old female the presents with short distance lifestyle limiting claudication of the bilateral lower extremities.  We recently performed right lower extremity intervention with SFA stents x2 after angioplasty on 04/29/19 and she is now about 60% better.  She wants to proceed with left leg intervention at this time.  She initially stated to prevent amputation and I again discussed that claudication is not a limb threatening situation and we should only intervene for quality of life and alleviation of her symptoms.  Discussed in the setting of her recent bradycardic event it may be wise to postpone any further intervention unless her symptoms worsen.  She does not feel she can tolerate this.  She wants to go ahead and proceed.  Cardiology note indicates likely vagal event and no need for further intervention.  We discussed trying to do this  without sedation given she is concerned that the bradycardia was a reaction to the sedation.   Marty Heck, MD [facility] Office: [phone]

## 2019-06-23 ENCOUNTER — Other Ambulatory Visit: Payer: Self-pay

## 2019-06-23 ENCOUNTER — Other Ambulatory Visit (INDEPENDENT_AMBULATORY_CARE_PROVIDER_SITE_OTHER): Payer: Medicare Other

## 2019-06-23 DIAGNOSIS — E039 Hypothyroidism, unspecified: Secondary | ICD-10-CM

## 2019-06-23 LAB — T4, FREE: Free T4: 1.14 ng/dL (ref 0.60–1.60)

## 2019-06-23 LAB — TSH: TSH: 2.44 u[IU]/mL (ref 0.35–4.50)

## 2019-06-24 ENCOUNTER — Other Ambulatory Visit (INDEPENDENT_AMBULATORY_CARE_PROVIDER_SITE_OTHER): Payer: Medicare Other

## 2019-06-24 ENCOUNTER — Other Ambulatory Visit: Payer: Self-pay

## 2019-06-24 DIAGNOSIS — E7800 Pure hypercholesterolemia, unspecified: Secondary | ICD-10-CM

## 2019-06-24 LAB — LIPID PANEL
Cholesterol: 189 mg/dL (ref 0–200)
HDL: 29.6 mg/dL — ABNORMAL LOW (ref >39.00)
NonHDL: 159.7
Total CHOL/HDL Ratio: 6
Triglycerides: 292 mg/dL — ABNORMAL HIGH (ref 0.0–149.0)
VLDL: 58.4 mg/dL — ABNORMAL HIGH (ref 0.0–40.0)

## 2019-06-24 LAB — LDL CHOLESTEROL, DIRECT: Direct LDL: 101 mg/dL

## 2019-06-24 NOTE — Progress Notes (Signed)
Per [place] add on Lipid Panel on blood drawn 6.3.21/thx dmf

## 2019-06-29 ENCOUNTER — Other Ambulatory Visit: Payer: Self-pay | Admitting: Family Medicine

## 2019-06-30 ENCOUNTER — Other Ambulatory Visit (HOSPITAL_COMMUNITY)
Admission: RE | Admit: 2019-06-30 | Discharge: 2019-06-30 | Disposition: A | Discharge status: Home / Self Care | Payer: Medicare Other | Source: Ambulatory Visit | Attending: Vascular Surgery | Admitting: Vascular Surgery

## 2019-06-30 DIAGNOSIS — Z01812 Encounter for preprocedural laboratory examination: Secondary | ICD-10-CM | POA: Diagnosis present

## 2019-06-30 DIAGNOSIS — Z20822 Contact with and (suspected) exposure to covid-19: Secondary | ICD-10-CM | POA: Diagnosis not present

## 2019-06-30 LAB — SARS CORONAVIRUS 2 (TAT 6-24 HRS): SARS Coronavirus 2: NEGATIVE

## 2019-07-01 ENCOUNTER — Encounter (HOSPITAL_COMMUNITY)
Admission: RE | Disposition: A | Discharge status: Home / Self Care | Payer: Self-pay | Source: Home / Self Care | Attending: Vascular Surgery

## 2019-07-01 ENCOUNTER — Ambulatory Visit (HOSPITAL_COMMUNITY)
Admission: RE | Admit: 2019-07-01 | Discharge: 2019-07-01 | Disposition: A | Discharge status: Home / Self Care | Payer: Medicare Other | Attending: Vascular Surgery | Admitting: Vascular Surgery

## 2019-07-01 ENCOUNTER — Other Ambulatory Visit: Payer: Medicare Other

## 2019-07-01 ENCOUNTER — Other Ambulatory Visit: Payer: Self-pay

## 2019-07-01 DIAGNOSIS — E039 Hypothyroidism, unspecified: Secondary | ICD-10-CM | POA: Diagnosis not present

## 2019-07-01 DIAGNOSIS — Z885 Allergy status to narcotic agent status: Secondary | ICD-10-CM | POA: Diagnosis not present

## 2019-07-01 DIAGNOSIS — Z923 Personal history of irradiation: Secondary | ICD-10-CM | POA: Insufficient documentation

## 2019-07-01 DIAGNOSIS — E1151 Type 2 diabetes mellitus with diabetic peripheral angiopathy without gangrene: Secondary | ICD-10-CM | POA: Insufficient documentation

## 2019-07-01 DIAGNOSIS — Z79899 Other long term (current) drug therapy: Secondary | ICD-10-CM | POA: Insufficient documentation

## 2019-07-01 DIAGNOSIS — I70212 Atherosclerosis of native arteries of extremities with intermittent claudication, left leg: Secondary | ICD-10-CM | POA: Insufficient documentation

## 2019-07-01 DIAGNOSIS — K219 Gastro-esophageal reflux disease without esophagitis: Secondary | ICD-10-CM | POA: Diagnosis not present

## 2019-07-01 DIAGNOSIS — Z87891 Personal history of nicotine dependence: Secondary | ICD-10-CM | POA: Insufficient documentation

## 2019-07-01 DIAGNOSIS — Z882 Allergy status to sulfonamides status: Secondary | ICD-10-CM | POA: Diagnosis not present

## 2019-07-01 DIAGNOSIS — E785 Hyperlipidemia, unspecified: Secondary | ICD-10-CM | POA: Insufficient documentation

## 2019-07-01 DIAGNOSIS — E7800 Pure hypercholesterolemia, unspecified: Secondary | ICD-10-CM | POA: Diagnosis not present

## 2019-07-01 DIAGNOSIS — Z7902 Long term (current) use of antithrombotics/antiplatelets: Secondary | ICD-10-CM | POA: Diagnosis not present

## 2019-07-01 DIAGNOSIS — I2510 Atherosclerotic heart disease of native coronary artery without angina pectoris: Secondary | ICD-10-CM | POA: Diagnosis not present

## 2019-07-01 DIAGNOSIS — Z794 Long term (current) use of insulin: Secondary | ICD-10-CM | POA: Insufficient documentation

## 2019-07-01 DIAGNOSIS — I10 Essential (primary) hypertension: Secondary | ICD-10-CM | POA: Diagnosis not present

## 2019-07-01 DIAGNOSIS — Z7982 Long term (current) use of aspirin: Secondary | ICD-10-CM | POA: Diagnosis not present

## 2019-07-01 DIAGNOSIS — Z9221 Personal history of antineoplastic chemotherapy: Secondary | ICD-10-CM | POA: Diagnosis not present

## 2019-07-01 DIAGNOSIS — F329 Major depressive disorder, single episode, unspecified: Secondary | ICD-10-CM | POA: Diagnosis not present

## 2019-07-01 DIAGNOSIS — Z955 Presence of coronary angioplasty implant and graft: Secondary | ICD-10-CM | POA: Insufficient documentation

## 2019-07-01 DIAGNOSIS — Z888 Allergy status to other drugs, medicaments and biological substances status: Secondary | ICD-10-CM | POA: Insufficient documentation

## 2019-07-01 DIAGNOSIS — F419 Anxiety disorder, unspecified: Secondary | ICD-10-CM | POA: Insufficient documentation

## 2019-07-01 DIAGNOSIS — Z881 Allergy status to other antibiotic agents status: Secondary | ICD-10-CM | POA: Insufficient documentation

## 2019-07-01 DIAGNOSIS — Z853 Personal history of malignant neoplasm of breast: Secondary | ICD-10-CM | POA: Diagnosis not present

## 2019-07-01 DIAGNOSIS — D649 Anemia, unspecified: Secondary | ICD-10-CM | POA: Insufficient documentation

## 2019-07-01 HISTORY — PX: ABDOMINAL AORTOGRAM W/LOWER EXTREMITY: CATH118223

## 2019-07-01 LAB — POCT I-STAT, CHEM 8
BUN: 25 mg/dL — ABNORMAL HIGH (ref 8–23)
Calcium, Ion: 1.31 mmol/L (ref 1.15–1.40)
Chloride: 103 mmol/L (ref 98–111)
Creatinine, Ser: 1.7 mg/dL — ABNORMAL HIGH (ref 0.44–1.00)
Glucose, Bld: 281 mg/dL — ABNORMAL HIGH (ref 70–99)
HCT: 35 % — ABNORMAL LOW (ref 36.0–46.0)
Hemoglobin: 11.9 g/dL — ABNORMAL LOW (ref 12.0–15.0)
Potassium: 5 mmol/L (ref 3.5–5.1)
Sodium: 136 mmol/L (ref 135–145)
TCO2: 28 mmol/L (ref 22–32)

## 2019-07-01 SURGERY — ABDOMINAL AORTOGRAM W/LOWER EXTREMITY
Anesthesia: LOCAL | Laterality: Left

## 2019-07-01 MED ORDER — SODIUM CHLORIDE 0.9 % IV SOLN: 250.0000 mL | INTRAVENOUS | Status: DC | PRN

## 2019-07-01 MED ORDER — HEPARIN (PORCINE) IN NACL 1000-0.9 UT/500ML-% IV SOLN
INTRAVENOUS | Status: AC
  Filled 2019-07-01: qty 1000

## 2019-07-01 MED ORDER — SODIUM CHLORIDE 0.9% FLUSH: 3.0000 mL | INTRAVENOUS | Status: DC | PRN

## 2019-07-01 MED ORDER — TRULICITY 0.75 MG/0.5ML ~~LOC~~ SOAJ: 0.7500 mg | SUBCUTANEOUS | 1 refills | Status: DC

## 2019-07-01 MED ORDER — LIDOCAINE HCL (PF) 1 % IJ SOLN
INTRAMUSCULAR | Status: DC | PRN
  Administered 2019-07-01: 15 mL

## 2019-07-01 MED ORDER — SODIUM CHLORIDE 0.9 % WEIGHT BASED INFUSION: 1.0000 mL/kg/h | INTRAVENOUS | Status: DC

## 2019-07-01 MED ORDER — HYDRALAZINE HCL 20 MG/ML IJ SOLN
INTRAMUSCULAR | Status: AC
  Filled 2019-07-01: qty 1

## 2019-07-01 MED ORDER — IODIXANOL 320 MG/ML IV SOLN
INTRAVENOUS | Status: DC | PRN
  Administered 2019-07-01: 17 mL via INTRA_ARTERIAL

## 2019-07-01 MED ORDER — LIDOCAINE HCL (PF) 1 % IJ SOLN
INTRAMUSCULAR | Status: AC
  Filled 2019-07-01: qty 30

## 2019-07-01 MED ORDER — SODIUM CHLORIDE 0.9% FLUSH: 3.0000 mL | Freq: Two times a day (BID) | INTRAVENOUS | Status: DC

## 2019-07-01 MED ORDER — HEPARIN (PORCINE) IN NACL 1000-0.9 UT/500ML-% IV SOLN
INTRAVENOUS | Status: DC | PRN
  Administered 2019-07-01 (×2): 500 mL

## 2019-07-01 MED ORDER — SODIUM CHLORIDE 0.9 % IV SOLN: INTRAVENOUS | Status: DC

## 2019-07-01 MED ORDER — LABETALOL HCL 5 MG/ML IV SOLN
10.0000 mg | INTRAVENOUS | Status: DC | PRN
  Administered 2019-07-01: 10 mg via INTRAVENOUS
  Filled 2019-07-01: qty 4

## 2019-07-01 MED ORDER — HYDRALAZINE HCL 20 MG/ML IJ SOLN
INTRAMUSCULAR | Status: DC | PRN
  Administered 2019-07-01: 10 mg via INTRAVENOUS

## 2019-07-01 SURGICAL SUPPLY — 17 items
CATH BEACON 5 .035 65 KMP TIP (CATHETERS) ×2 IMPLANT
CATH CROSS OVER TEMPO 5F (CATHETERS) ×2 IMPLANT
CATH OMNI FLUSH 5F 65CM (CATHETERS) ×2 IMPLANT
CATH TEMPO AQUA 5F 100CM (CATHETERS) ×2 IMPLANT
CLOSURE MYNX CONTROL 5F (Vascular Products) ×2 IMPLANT
FILTER CO2 0.2 MICRON (VASCULAR PRODUCTS) ×2 IMPLANT
KIT MICROPUNCTURE NIT STIFF (SHEATH) ×2 IMPLANT
KIT PV (KITS) ×2 IMPLANT
RESERVOIR CO2 (VASCULAR PRODUCTS) ×2 IMPLANT
SET FLUSH CO2 (MISCELLANEOUS) ×2 IMPLANT
SHEATH PINNACLE 5F 10CM (SHEATH) ×2 IMPLANT
SHEATH PROBE COVER 6X72 (BAG) ×2 IMPLANT
SYR MEDRAD MARK V 150ML (SYRINGE) ×2 IMPLANT
TRANSDUCER W/STOPCOCK (MISCELLANEOUS) ×2 IMPLANT
TRAY PV CATH (CUSTOM PROCEDURE TRAY) ×2 IMPLANT
WIRE BENTSON .035X145CM (WIRE) ×2 IMPLANT
WIRE HI TORQ VERSACORE J 260CM (WIRE) ×2 IMPLANT

## 2019-07-01 NOTE — Progress Notes (Signed)
Pt ambulated in the hall and to the bathroom. Voided and ambulated without difficulty. No new bleeding noted. Pt DC home. VSS.

## 2019-07-01 NOTE — Addendum Note (Signed)
Addended by: Ronnald Nian on: 07/01/2019 07:43 AM   Modules accepted: Orders

## 2019-07-01 NOTE — Discharge Instructions (Signed)
Femoral Site Care This sheet gives you information about how to care for yourself after your procedure. Your health care provider may also give you more specific instructions. If you have problems or questions, contact your health care provider. What can I expect after the procedure? After the procedure, it is common to have:  Bruising that usually fades within 1-2 weeks.  Tenderness at the site. Follow these instructions at home: Wound care  Follow instructions from your health care provider about how to take care of your insertion site. Make sure you: ? Wash your hands with soap and water before you change your bandage (dressing). If soap and water are not available, use hand sanitizer. ? Change your dressing as told by your health care provider. ? Leave stitches (sutures), skin glue, or adhesive strips in place. These skin closures may need to stay in place for 2 weeks or longer. If adhesive strip edges start to loosen and curl up, you may trim the loose edges. Do not remove adhesive strips completely unless your health care provider tells you to do that.  Do not take baths, swim, or use a hot tub until your health care provider approves.  You may shower 24-48 hours after the procedure or as told by your health care provider. ? Gently wash the site with plain soap and water. ? Pat the area dry with a clean towel. ? Do not rub the site. This may cause bleeding.  Do not apply powder or lotion to the site. Keep the site clean and dry.  Check your femoral site every day for signs of infection. Check for: ? Redness, swelling, or pain. ? Fluid or blood. ? Warmth. ? Pus or a bad smell. Activity  For the first 2-3 days after your procedure, or as long as directed: ? Avoid climbing stairs as much as possible. ? Do not squat.  Do not lift anything that is heavier than 10 lb (4.5 kg), or the limit that you are told, until your health care provider says that it is safe.  Rest as  directed. ? Avoid sitting for a long time without moving. Get up to take short walks every 1-2 hours.  Do not drive for 24 hours if you were given a medicine to help you relax (sedative). General instructions  Take over-the-counter and prescription medicines only as told by your health care provider.  Keep all follow-up visits as told by your health care provider. This is important. Contact a health care provider if you have:  A fever or chills.  You have redness, swelling, or pain around your insertion site. Get help right away if:  The catheter insertion area swells very fast.  You pass out.  You suddenly start to sweat or your skin gets clammy.  The catheter insertion area is bleeding, and the bleeding does not stop when you hold steady pressure on the area.  The area near or just beyond the catheter insertion site becomes pale, cool, tingly, or numb. These symptoms may represent a serious problem that is an emergency. Do not wait to see if the symptoms will go away. Get medical help right away. Call your local emergency services (911 in the [place] Do not drive yourself to the hospital. Summary  After the procedure, it is common to have bruising that usually fades within 1-2 weeks.  Check your femoral site every day for signs of infection.  Do not lift anything that is heavier than 10 lb (4.5 kg), or the   limit that you are told, until your health care provider says that it is safe. This information is not intended to replace advice given to you by your health care provider. Make sure you discuss any questions you have with your health care provider. Document Revised: 01/21/2017 Document Reviewed: 01/21/2017 Elsevier Patient Education  2020 [employer] Inc[employer]  

## 2019-07-01 NOTE — Op Note (Signed)
Patient name: [name] MRN: [id number] DOB: [birth year] Sex: female  07/01/2019 Pre-operative Diagnosis: Lifestyle limiting claudication of the left lower extremity Post-operative diagnosis:  Same Surgeon:  Marty Heck, MD Procedure Performed: 1.  Ultrasound-guided access of the right common femoral artery 2.  CO2 aortogram including catheter selection of aorta 3.  Left lower extremity arteriogram with selection of third order branches with limited contrast 4.  Mynx closure of the right common femoral artery  Indications: 79 year old female who presented with bilateral lower extremity lifestyle limiting short distance claudication.  She previously underwent right lower extremity intervention and had multiple SFA stents placed.  She presents today for planned left lower extremity arteriogram and possible intervention after risks and benefits discussed.  Findings:   Aortogram shows a diseased distal infrarenal aorta with approximate 50% stenosis from calcification but the iliacs themselves appear patent.  She does have excellent left femoral pulse distally.  Left common femoral and profunda are widely patent and the SFA is moderately diseased throughout its course without any flow-limiting stenosis.  Approximate 50% stenosis at Hunter's canal.  Approximate 50% stenosis at popliteal artery behind the knee.  Patient has single-vessel runoff via peroneal artery with a greater than 60 to 70% stenosis in the TP trunk as well as at least 3 stenosis high-grade in the distal peroneal artery.  Even though she has single-vessel peroneal runoff she has very brisk inflow down the left lower extremity with filling of the distal AT and dorsalis pedis at the ankle through a collateral.  Ultimately after evaluating the pictures, I elected not to intervene given single-vessel peroneal runoff in the setting of claudication.  Discussed with the patient I would let her symptoms progress before  potentially intervening on single-vessel peroneal runoff given her symptoms at this time and no evidence of critical limb ischemia.   Procedure:  The patient was identified in the holding area and taken to room 8.  The patient was then placed supine on the table and prepped and draped in the usual sterile fashion.  A time out was called.  Ultrasound was used to evaluate the right common femoral artery.  It was patent .  A digital ultrasound image was acquired.  A micropuncture needle was used to access the right common femoral artery under ultrasound guidance.  An 018 wire was advanced without resistance and a micropuncture sheath was placed.  The 018 wire was removed and a benson wire was placed.  The micropuncture sheath was exchanged for a 5 french sheath.  An omniflush catheter was advanced over the wire to the level of L-1.  An abdominal angiogram was obtained with CO2.  Next, using the omniflush catheter and a benson wire, the aortic bifurcation was crossed and the catheter was placed into theleft external iliac artery and left leg runoff was obtained.  Ultimately in order to get better imaging given using CO2 for the majority of the case, I put a versa core wire down the SFA and exchanged for a long 100 cm straight flush catheter.  We then got dedicated imaging of the left lower extremity with limited contrast.  Ultimately elected not to intervene given single-vessel peroneal runoff and claudication.  Wires and catheters were removed.  A mynx closure device was deployed in the right common femoral artery.  Taken to recovery in stable condition.   Plan: I will have the patient follow-up in 1 month.  Marty Heck, MD [facility] Office:  [phone]    

## 2019-07-01 NOTE — H&P (Signed)
History and Physical Interval Note:  07/01/2019 8:34 AM  [name]  has presented today for surgery, with the diagnosis of Peripheral Artery Disease.  The various methods of treatment have been discussed with the patient and family. After consideration of risks, benefits and other options for treatment, the patient has consented to  Procedure(s): ABDOMINAL AORTOGRAM W/ Left LOWER EXTREMITY Runoff (N/A) as a surgical intervention.  The patient's history has been reviewed, patient examined, no change in status, stable for surgery.  I have reviewed the patient's chart and labs.  Questions were answered to the patient's satisfaction.    Aortogram, lower extremity arteriogram, left leg, short distance lifestyle limiting claudication.  Marty Heck  Patient name: [name] MRN: [id number] DOB: 09/15/1940 Sex: female  REASON FOR CONSULT: Follow-up after after right lower extremity intervention for claudication  HPI:  [name] is a 79 y.o. female, with history of hypertension, hyperlipidemia, diabetes, coronary artery disease status post remote PCI with stent, breast cancer that presents for follow-up after right leg intervention for bilateral lower extremity lifestyle limiting claudication.  Ultimately she was taken to the Cath Lab and a right lower extremity angio was performed with stent of her SFA x 2 drug-coated Eluvia on 04/29/2019. Ultimately she became bradycardic during the event had to get atropine and we stopped intervention after SFA stents placed. She did have about a 50% popliteal stenosis in that leg that we did not treat since it did not appear flow-limiting and she had also become unstable with her bradycardia.  On follow-up today feels her right leg is about 50 to 60% better. She wants to proceed with her left leg now and states cardiology has cleared her and told her she does not need a pacemaker. Note indicate likely vagal event.  Prior to intervention she had 3 to  4 months of claudication symptoms and could only walk about 10 to 15 feet prior to intervention.      Past Medical History:  Diagnosis Date  . Allergy   . Anemia    childhood  . Anxiety   . Arterial stenosis (HCC)    mesenteric  . Arthritis   . Breast cancer ([place]) 12/05/10   R breast, inv mammary, in situ,ER/PR +,HER2 -  . Cancer ([place])   . Carcinoma of breast treated with adjuvant chemotherapy ([place])   . Coronary artery disease    stent 2007  . Depression   . Diabetes mellitus   . Difficulty sleeping   . Diverticulosis 12/10/03  . Elevated cholesterol   . Generalized weakness   . GERD (gastroesophageal reflux disease)   . Heart murmur    secondary to chem. 2013  . Hepatitis    as an infant  . HSV-1 (herpes simplex virus 1) infection   . Hyperlipidemia   . Hypertension   . Hypothyroidism   . Osteoarthritis   . Personal history of chemotherapy   . Personal history of radiation therapy   . Pneumonia    2014 september  . 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 Spinal stenosis   . Thyroid disease         Past Surgical History:  Procedure Laterality Date  . ABDOMINAL AORTOGRAM W/LOWER EXTREMITY Bilateral 04/29/2019   Procedure: ABDOMINAL AORTOGRAM W/LOWER EXTREMITY; Surgeon: Marty Heck, MD; Location: [facility]; Service: Cardiovascular; Laterality: Bilateral;  . ANTERIOR AND POSTERIOR REPAIR N/A 04/05/2016   Procedure: ANTERIOR (CYSTOCELE) AND POSTERIOR REPAIR (  RECTOCELE); Surgeon: [name], MD; Location: [facility]; Service: Gynecology; Laterality: N/A;  . BREAST BIOPSY    . BREAST LUMPECTOMY  1983   benign biopsy  . BREAST LUMPECTOMY Right 12/29/2010   snbx, ER/PR +, Her2 -, 0/1 node pos.  . CAROTID STENT  2007  . CARPAL TUNNEL RELEASE Bilateral 9/12,8,12  . CHOLECYSTECTOMY    . COLONOSCOPY N/A 02/09/2013   Procedure: COLONOSCOPY; Surgeon: [name], MD; Location: [facility]; Service: Endoscopy; Laterality: N/A;  . COLONOSCOPY     . CORONARY ANGIOPLASTY    . DILATION AND CURETTAGE OF UTERUS    . ESOPHAGOGASTRODUODENOSCOPY N/A 02/09/2013   Procedure: ESOPHAGOGASTRODUODENOSCOPY (EGD); Surgeon: [name], MD; Location: [name]; Service: Endoscopy; Laterality: N/A;  . FOOT SPURS    . HYSTEROSCOPY WITH D & C N/A 09/11/2012   Procedure: DILATATION AND CURETTAGE ; Surgeon: [name], MD; Location: [facility]; Service: Gynecology; Laterality: N/A;  . KNEE SURGERY  2007  . LAPAROSCOPIC ASSISTED VAGINAL HYSTERECTOMY N/A 04/05/2016   Procedure: LAPAROSCOPIC ASSISTED VAGINAL HYSTERECTOMY possible BSO; Surgeon: [name], MD; Location: [facility]; Service: Gynecology; Laterality: N/A;  . LUMBAR LAMINECTOMY/DECOMPRESSION MICRODISCECTOMY N/A 10/20/2014   Procedure: MICRO LUMBER DECOMPRESSION L3-4 L4-5; Surgeon: [name], MD; Location: [facility]; Service: Orthopedics; Laterality: N/A;  . LUMBAR LAMINECTOMY/DECOMPRESSION MICRODISCECTOMY Bilateral 10/13/2015   Procedure: MICRO LUMBAR DECOMPRESSION L5 - S1 AND REDO DECOMPRESSION L4 - L5 AND REMOVAL OF FACET CYST L4 - L5 2 LEVELS; Surgeon: [name], MD; Location: [facility]; Service: Orthopedics; Laterality: Bilateral;  . PERIPHERAL VASCULAR INTERVENTION Right 04/29/2019   Procedure: PERIPHERAL VASCULAR INTERVENTION; Surgeon: Marty Heck, MD; Location: [facility]; Service: Cardiovascular; Laterality: Right;  . PORT-A-CATH REMOVAL  04/17/2011   Procedure: REMOVAL PORT-A-CATH; Surgeon: [name], MD; Location: [facility]; Service: General; Laterality: Left;  . PORTACATH PLACEMENT  02/07/2011   Procedure: INSERTION PORT-A-CATH; Surgeon: [name], MD; Location: [facility]; Service: General; Laterality: N/A;  . UPPER GASTROINTESTINAL ENDOSCOPY          Family History  Problem Relation Age of Onset  . Throat cancer Father   . Alcoholism Father   . Heart attack Father   . Heart disease Mother   . Lymphoma Brother 40  . Cancer Son   .  Heart attack Brother   . Diabetes Brother   . Hypertension Brother   . Colon cancer Neg Hx   . Stroke Neg Hx   . Esophageal cancer Neg Hx   . Rectal cancer Neg Hx   . Stomach cancer Neg Hx    SOCIAL HISTORY:  Social History        Socioeconomic History  . Marital status: Married    Spouse name: Not on file  . Number of children: 2  . Years of education: Not on file  . Highest education level: Not on file  Occupational History  . Occupation: retired  Tobacco Use  . Smoking status: Former Smoker    Packs/day: 2.00    Years: 40.00    Pack years: 80.00    Quit date: 12/19/1997    Years since quitting: 21.4  . Smokeless tobacco: Never Used  Substance and Sexual Activity  . Alcohol use: Yes    Alcohol/week: 6.0 standard drinks    Types: 6 Glasses of wine per week    Comment: occasional  . Drug use: No  . 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 Not on file  Social History Narrative   [name] age 21- [place] age 19 [place]   Social Determinants of Health      Financial Resource Strain:   . Difficulty of Paying Living Expenses:   Food Insecurity:   . Worried About Charity fundraiser in the Last Year:   . Arboriculturist in the Last Year:   Transportation Needs:   . Film/video editor (Medical):   Marland Kitchen Lack of Transportation (Non-Medical):   Physical Activity:   . Days of Exercise per Week:   . Minutes of Exercise per Session:   Stress:   . Feeling of Stress :   Social Connections:   . Frequency of Communication with Friends and Family:   . Frequency of Social Gatherings with Friends and Family:   . Attends Religious Services:   . Active Member of Clubs or Organizations:   . Attends Archivist Meetings:   Marland Kitchen Marital Status:   Intimate Partner Violence:   . Fear of Current or Ex-Partner:   . Emotionally Abused:   Marland Kitchen Physically Abused:   . Sexually Abused:          Allergies  Allergen Reactions  . Codeine Nausea And Vomiting and Other (See Comments)    Severe stomach cramps  . Antihistamines, Diphenhydramine-Type Other (See Comments)    Causes hyperactivity  . Gabapentin     Urinary incontinence  . Statins Other (See Comments)    Joint pains  . Sulfa Antibiotics     Unknown to pt  . Erythromycin Hives and Rash    Due to dental work about 50 years ago. Was used in packing and resulted in rash/hives inside and outside of mouth.         Current Outpatient Medications  Medication Sig Dispense Refill  . acetaminophen (TYLENOL) 500 MG tablet Take 1,000 mg by mouth every 6 (six) hours as needed for moderate pain or headache.    Marland Kitchen aspirin 81 MG tablet Take 1 tablet (81 mg total) by mouth daily. Resume 4 days post-op    . calcium carbonate (TUMS - DOSED IN MG ELEMENTAL CALCIUM) 500 MG chewable tablet Chew 2 tablets by mouth 3 (three) times daily as needed for indigestion or heartburn.    . chlorthalidone (HYGROTON) 25 MG tablet TAKE 1/2 TABLET(12.5 MG) BY MOUTH EVERY DAY FOR 3 DAYS THEN TAKE 1 TABLET(25 MG) BY MOUTH EVERY DAY 90 tablet 0  . Cholecalciferol (VITAMIN D) 2000 units CAPS Take 2,000 Units by mouth daily.     . clobetasol cream (TEMOVATE) 3.41 % Apply 1 application topically 2 (two) times daily. (Patient taking differently: Apply 1 application topically 2 (two) times daily as needed (itching). ) 30 g 0  . clopidogrel (PLAVIX) 75 MG tablet Take 1 tablet (75 mg total) by mouth daily with breakfast. 30 tablet 6  . Dulaglutide (TRULICITY) 9.62 IW/9.7LG SOPN Inject 0.75 mg into the skin once a week. (Patient taking differently: Inject 0.75 mg into the skin every Saturday. ) 1 pen 3  . ezetimibe (ZETIA) 10 MG tablet Take 1 tablet (10 mg total) by mouth daily. 90 tablet 3  . ferrous sulfate (FERROUSUL) 325 (65 FE) MG tablet Take 1 tablet (325 mg total) by mouth daily with breakfast. 30 tablet 3  . glipiZIDE (GLUCOTROL XL) 10 MG 24 hr tablet  Take 1 tablet (10 mg total) by mouth daily with breakfast. 90  tablet 3  . glucose blood (ACCU-CHEK GUIDE) test strip TEST ONCE DAILY 100 each 5  . JANUVIA 100 MG tablet TAKE 1 TABLET BY MOUTH DAILY (Patient taking differently: Take 100 mg by mouth daily. ) 90 tablet 3  . Lancet Device MISC 100 each by Does not apply route daily. UAD for daily glucose monitoring 100 each PRN  . Lancets Misc. (ACCU-CHEK MULTICLIX LANCET DEV) KIT Use as directed to check sugars    . levothyroxine (SYNTHROID) 88 MCG tablet Take 1 tablet (88 mcg total) by mouth daily. 90 tablet 3  . losartan (COZAAR) 50 MG tablet TAKE 1 TABLET BY MOUTH DAILY (Patient taking differently: Take 50 mg by mouth daily. ) 90 tablet 3  . metFORMIN (GLUCOPHAGE) 500 MG tablet Take 1 tablet (500 mg total) by mouth daily with breakfast. 90 tablet 3  . nitroGLYCERIN (NITROSTAT) 0.4 MG SL tablet Place 1 tablet (0.4 mg total) under the tongue every 5 (five) minutes as needed for chest pain. 25 tablet 3  . pantoprazole (PROTONIX) 40 MG tablet TAKE 1 TABLET BY MOUTH DAILY (Patient taking differently: Take 40 mg by mouth daily. ) 90 tablet 1  . PARoxetine (PAXIL) 20 MG tablet TAKE 1 TABLET BY MOUTH EVERY DAY IN THE MORNING (Patient taking differently: Take 20 mg by mouth daily. ) 90 tablet 1  . valACYclovir (VALTREX) 1000 MG tablet TAKE 1 TABLET BY MOUTH 2 TIMES DAILY AS NEEDED FOR VIRAL INFECTION. (Patient taking differently: Take 1,000 mg by mouth 2 (two) times daily as needed (fever blisters). ) 180 tablet 0  . vitamin B-12 (CYANOCOBALAMIN) 500 MCG tablet Take 500 mcg by mouth daily.     No current facility-administered medications for this visit.   REVIEW OF SYSTEMS:  '[X]'  denotes positive finding, '[ ]'  denotes negative finding  Cardiac  Comments:  Chest pain or chest pressure:    Shortness of breath upon exertion:    Short of breath when lying flat:    Irregular heart rhythm:        Vascular    Pain in calf, thigh, or hip brought on by  ambulation:    Pain in feet at night that wakes you up from your sleep:     Blood clot in your veins:    Leg swelling:         Pulmonary    Oxygen at home:    Productive cough:     Wheezing:         Neurologic    Sudden weakness in arms or legs:     Sudden numbness in arms or legs:     Sudden onset of difficulty speaking or slurred speech:    Temporary loss of vision in one eye:     Problems with dizziness:         Gastrointestinal    Blood in stool:     Vomited blood:         Genitourinary    Burning when urinating:     Blood in urine:        Psychiatric    Major depression:         Hematologic    Bleeding problems:    Problems with blood clotting too easily:        Skin    Rashes or ulcers:        Constitutional    Fever or chills:    PHYSICAL EXAM:     Vitals:   06/09/19 1522  BP: Marland Kitchen)  158/79  Pulse: 92  Resp: 14  Temp: (!) 97.3 F (36.3 C)  TempSrc: Temporal  SpO2: 97%  Weight: 147 lb (66.7 kg)  Height: '5\' 3"'  (1.6 m)   GENERAL: The patient is a well-nourished female, in no acute distress. The vital signs are documented above.  CARDIAC: There is a regular rate and rhythm.  VASCULAR:  Palpable femoral pulses bilaterally  PULMONARY: There is good air exchange bilaterally without wheezing or rales.  ABDOMEN: Soft and non-tender with normal pitched bowel sounds.  MUSCULOSKELETAL: There are no major deformities or cyanosis.  NEUROLOGIC: No focal weakness or paresthesias are detected.  DATA:  Right leg arterial duplex from today shows patent right SFA stents with about a 50% popliteal stenosis distal to the stent that was identified on arteriogram.  Left lower extremity arterial duplex from last month showed a high-grade stenosis of the distal SFA and high grade stenosis in the TP trunk and PT.  Assessment/Plan:  79 year old female the presents with short distance lifestyle limiting claudication of the bilateral lower extremities. We recently performed right  lower extremity intervention with SFA stents x2 after angioplasty on 04/29/19 and she is now about 60% better. She wants to proceed with left leg intervention at this time. She initially stated to prevent amputation and I again discussed that claudication is not a limb threatening situation and we should only intervene for quality of life and alleviation of her symptoms. Discussed in the setting of her recent bradycardic event it may be wise to postpone any further intervention unless her symptoms worsen. She does not feel she can tolerate this. She wants to go ahead and proceed. Cardiology note indicates likely vagal event and no need for further intervention. We discussed trying to do this without sedation given she is concerned that the bradycardia was a reaction to the sedation.  Marty Heck, MD  [facility]  Office: [phone]

## 2019-07-03 ENCOUNTER — Encounter (HOSPITAL_COMMUNITY): Payer: Self-pay | Admitting: Vascular Surgery

## 2019-07-09 ENCOUNTER — Other Ambulatory Visit: Payer: Self-pay | Admitting: Family Medicine

## 2019-07-09 DIAGNOSIS — E7800 Pure hypercholesterolemia, unspecified: Secondary | ICD-10-CM

## 2019-07-22 ENCOUNTER — Other Ambulatory Visit: Payer: Self-pay | Admitting: *Deleted

## 2019-07-22 DIAGNOSIS — I739 Peripheral vascular disease, unspecified: Secondary | ICD-10-CM

## 2019-08-04 ENCOUNTER — Ambulatory Visit (INDEPENDENT_AMBULATORY_CARE_PROVIDER_SITE_OTHER): Payer: Medicare Other | Admitting: Physician Assistant

## 2019-08-04 ENCOUNTER — Other Ambulatory Visit: Payer: Self-pay

## 2019-08-04 ENCOUNTER — Ambulatory Visit (HOSPITAL_COMMUNITY)
Admission: RE | Admit: 2019-08-04 | Discharge: 2019-08-04 | Disposition: A | Discharge status: Home / Self Care | Payer: Medicare Other | Source: Ambulatory Visit | Attending: Vascular Surgery | Admitting: Vascular Surgery

## 2019-08-04 VITALS — BP 194/90 | HR 83 | Temp 97.0°F | Resp 20 | Ht 63.0 in | Wt 146.8 lb

## 2019-08-04 DIAGNOSIS — I739 Peripheral vascular disease, unspecified: Secondary | ICD-10-CM

## 2019-08-04 NOTE — Progress Notes (Signed)
Office Note     CC:  follow up Requesting Provider:  Ronnald Nian, DO  HPI: [name] is a 79 y.o. (02-26-1940) female who presents for routine follow-up post LLE arteriogram on 07/01/2019 by Dr. Carlis Abbott. She previously underwent right lower extremity intervention and had multiple SFA stents placed on 04/29/2019. "Even though she has single-vessel peroneal runoff she has very brisk inflow down the left lower extremity with filling of the distal AT and dorsalis pedis at the ankle through a collateral", therefore no intervention carried out.  She reports continued discomfort in the left lower extremity when walking approximately 10 to 15 ft that resolves with rest. She denies rest pain. She states she has been dividing her Plavix tablet in half as she has noticed the full dose is causing severe dry mouth. She is unable to take statins due to intolerable side effects. She is compliant with daily aspirin.  The patient states she has a history of carotid artery stenosis which is followed by her cardiologist.  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   Dr  [name]   Spinal stenosis    Thyroid disease     Past Surgical History:  Procedure Laterality Date   ABDOMINAL AORTOGRAM W/LOWER EXTREMITY Bilateral 04/29/2019   Procedure: ABDOMINAL AORTOGRAM W/LOWER EXTREMITY;  Surgeon: Marty Heck, MD;  Location: [facility];  Service: Cardiovascular;  Laterality: Bilateral;   ABDOMINAL AORTOGRAM W/LOWER EXTREMITY Left 07/01/2019   Procedure: ABDOMINAL AORTOGRAM W/ Left LOWER EXTREMITY Runoff;  Surgeon: Marty Heck, MD;  Location: [facility];  Service: Cardiovascular;  Laterality: Left;   ANTERIOR AND POSTERIOR REPAIR N/A 04/05/2016   Procedure: ANTERIOR (CYSTOCELE) AND POSTERIOR REPAIR (RECTOCELE);  Surgeon: [name], MD;  Location: [facility];  Service: Gynecology;  Laterality: N/A;   BREAST BIOPSY     BREAST LUMPECTOMY  1983   benign biopsy   BREAST LUMPECTOMY Right 12/29/2010   snbx, ER/PR +, Her2 -, 0/1 node pos.   CAROTID STENT  2007   CARPAL TUNNEL RELEASE Bilateral 9/12,8,12   CHOLECYSTECTOMY     COLONOSCOPY N/A 02/09/2013   Procedure: COLONOSCOPY;  Surgeon: [name], MD;  Location: [facility];  Service: Endoscopy;  Laterality: N/A;   COLONOSCOPY     CORONARY ANGIOPLASTY     DILATION AND CURETTAGE OF UTERUS     ESOPHAGOGASTRODUODENOSCOPY N/A 02/09/2013   Procedure: ESOPHAGOGASTRODUODENOSCOPY (EGD);  Surgeon: [name],  MD;  Location: [facility];  Service: Endoscopy;  Laterality: N/A;   FOOT SPURS     HYSTEROSCOPY WITH D & C N/A 09/11/2012   Procedure: DILATATION AND CURETTAGE ;  Surgeon: [name], MD;  Location: [facility];  Service: Gynecology;  Laterality: N/A;   KNEE SURGERY  2007   LAPAROSCOPIC ASSISTED VAGINAL HYSTERECTOMY N/A 04/05/2016   Procedure: LAPAROSCOPIC ASSISTED VAGINAL HYSTERECTOMY possible BSO;  Surgeon: [name], MD;  Location: [facility];  Service: Gynecology;  Laterality: N/A;   LUMBAR LAMINECTOMY/DECOMPRESSION MICRODISCECTOMY N/A 10/20/2014    Procedure: MICRO LUMBER DECOMPRESSION L3-4 L4-5;  Surgeon: [name], MD;  Location: [facility];  Service: Orthopedics;  Laterality: N/A;   LUMBAR LAMINECTOMY/DECOMPRESSION MICRODISCECTOMY Bilateral 10/13/2015   Procedure: MICRO LUMBAR DECOMPRESSION L5 - S1 AND REDO DECOMPRESSION L4 - L5 AND REMOVAL OF FACET CYST L4 - L5 2 LEVELS;  Surgeon: [name], MD;  Location: [facility];  Service: Orthopedics;  Laterality: Bilateral;   PERIPHERAL VASCULAR INTERVENTION Right 04/29/2019   Procedure: PERIPHERAL VASCULAR INTERVENTION;  Surgeon: Marty Heck, MD;  Location: [facility];  Service: Cardiovascular;  Laterality: Right;   PORT-A-CATH REMOVAL  04/17/2011   Procedure: REMOVAL PORT-A-CATH;  Surgeon: [name], MD;  Location: [facility];  Service: General;  Laterality: Left;   PORTACATH PLACEMENT  02/07/2011   Procedure: INSERTION PORT-A-CATH;  Surgeon: [name], MD;  Location: [facility];  Service: General;  Laterality: N/A;   UPPER GASTROINTESTINAL ENDOSCOPY      Social History   Socioeconomic History   Marital status: Married    Spouse name: Not on file   Number of children: 2   Years of education: Not on file   Highest education level: Not on file  Occupational History   Occupation: retired  Tobacco Use   Smoking status: Former Smoker    Packs/day: 2.00    Years: 40.00    Pack years: 80.00    Quit date: 12/19/1997    Years since quitting: 21.6   Smokeless tobacco: Never Used  Vaping Use   Vaping Use: Never used  Substance and Sexual Activity   Alcohol use: Yes    Alcohol/week: 6.0 standard drinks    Types: 6 Glasses of wine per week    Comment: occasional   Drug use: No   Sexual activity: Not on file    Comment: menarch age 36, P37, HRT X 10 YRS, MENOPAUSE MID 40'S  Other Topics Concern   Not on file  Social History Narrative   [place] age 24- [place] age 16  [place]   Social Determinants of Health    Emergency planning/management officer Strain:    Difficulty of Paying Living Expenses:   Food Insecurity:    Worried About Charity fundraiser in the Last Year:    Arboriculturist in the Last Year:   Transportation Needs:    Film/video editor (Medical):    Lack of Transportation (Non-Medical):   Physical Activity:    Days of Exercise per Week:    Minutes of Exercise per Session:   Stress:    Feeling of Stress :   Social Connections:    Frequency of Communication with Friends and Family:    Frequency of Social Gatherings with Friends and Family:    Attends Religious Services:    Active Member of Clubs or Organizations:    Attends Archivist Meetings:    Marital Status:   Intimate Partner Violence:  Fear of Current or Ex-Partner:    Emotionally Abused:    Physically Abused:    Sexually Abused:    Family History  Problem Relation Age of Onset   Throat cancer Father    Alcoholism Father    Heart attack Father    Heart disease Mother    Lymphoma Brother 16   Cancer Son    Heart attack Brother    Diabetes Brother    Hypertension Brother    Colon cancer Neg Hx    Stroke Neg Hx    Esophageal cancer Neg Hx    Rectal cancer Neg Hx    Stomach cancer Neg Hx     Current Outpatient Medications  Medication Sig Dispense Refill   acetaminophen (TYLENOL) 500 MG tablet Take 1,000 mg by mouth every 6 (six) hours as needed for moderate pain or headache.     aspirin EC 81 MG tablet Take 81 mg by mouth at bedtime.     calcium carbonate (TUMS - DOSED IN MG ELEMENTAL CALCIUM) 500 MG chewable tablet Chew 2 tablets by mouth 3 (three) times daily as needed for indigestion or heartburn.     chlorthalidone (HYGROTON) 25 MG tablet TAKE 1/2 TABLET(12.5 MG) BY MOUTH EVERY DAY FOR 3 DAYS THEN TAKE 1 TABLET(25 MG) BY MOUTH EVERY DAY (Patient taking differently: Take 25 mg by mouth daily as needed (elevated blood pressure.). ) 90 tablet 0   cholecalciferol  (VITAMIN D) 25 MCG (1000 UNIT) tablet Take 1,000 Units by mouth daily.     clobetasol cream (TEMOVATE) 8.93 % Apply 1 application topically 2 (two) times daily. (Patient taking differently: Apply 1 application topically 2 (two) times daily as needed (itching). ) 30 g 0   clopidogrel (PLAVIX) 75 MG tablet Take 1 tablet (75 mg total) by mouth daily with breakfast. 30 tablet 6   Dulaglutide (TRULICITY) 7.34 KA/7.6OT SOPN Inject 0.5 mLs (0.75 mg total) into the skin every Saturday. 6 mL 1   ferrous sulfate (FERROUSUL) 325 (65 FE) MG tablet Take 1 tablet (325 mg total) by mouth daily with breakfast. (Patient taking differently: Take 325 mg by mouth every other day. ) 30 tablet 3   glipiZIDE (GLUCOTROL XL) 10 MG 24 hr tablet Take 1 tablet (10 mg total) by mouth daily with breakfast. (Patient taking differently: Take 10 mg by mouth daily at 12 noon. ) 90 tablet 3   glucose blood (ACCU-CHEK GUIDE) test strip TEST ONCE DAILY 100 each 5   Golimumab (SIMPONI ARIA IV) Inject 1 Dose into the vein See admin instructions. Every 2 months.     JANUVIA 100 MG tablet TAKE 1 TABLET BY MOUTH  DAILY (Patient taking differently: Take 100 mg by mouth at bedtime. ) 90 tablet 3   Lancet Device MISC 100 each by Does not apply route daily. UAD for daily glucose monitoring 100 each PRN   Lancets Misc. (ACCU-CHEK MULTICLIX LANCET DEV) KIT Use as directed to check sugars     levothyroxine (SYNTHROID) 88 MCG tablet Take 1 tablet (88 mcg total) by mouth daily. 90 tablet 3   losartan (COZAAR) 50 MG tablet TAKE 1 TABLET BY MOUTH  DAILY (Patient taking differently: Take 50 mg by mouth daily. ) 90 tablet 3   metFORMIN (GLUCOPHAGE) 500 MG tablet Take 1 tablet (500 mg total) by mouth daily with breakfast. 90 tablet 3   nitroGLYCERIN (NITROSTAT) 0.4 MG SL tablet Place 1 tablet (0.4 mg total) under the tongue every 5 (five) minutes as needed for  chest pain. 25 tablet 3   Omega-3 Fatty Acids (FISH OIL) 1000 MG CAPS Take 1,000  mg by mouth in the morning and at bedtime.     pantoprazole (PROTONIX) 40 MG tablet TAKE 1 TABLET BY MOUTH  DAILY (Patient taking differently: Take 40 mg by mouth daily. ) 90 tablet 1   PARoxetine (PAXIL) 20 MG tablet TAKE 1 TABLET BY MOUTH EVERY DAY IN THE MORNING (Patient taking differently: Take 20 mg by mouth daily. ) 90 tablet 1   valACYclovir (VALTREX) 1000 MG tablet TAKE 1 TABLET BY MOUTH 2  TIMES DAILY AS NEEDED FOR  VIRAL INFECTION. (Patient taking differently: Take 1,000 mg by mouth 2 (two) times daily as needed (fever blisters). ) 180 tablet 0   vitamin B-12 (CYANOCOBALAMIN) 1000 MCG tablet Take 1,000 mcg by mouth daily.     No current facility-administered medications for this visit.    Allergies  Allergen Reactions   Codeine Nausea And Vomiting and Other (See Comments)    Severe stomach cramps   Antihistamines, Diphenhydramine-Type Other (See Comments)    Causes hyperactivity   Gabapentin     Urinary incontinence   Statins Other (See Comments)    Joint pains   Sulfa Antibiotics     Unknown to pt   Erythromycin Hives and Rash    Due to dental work about 50 years ago. Was used in packing and resulted in rash/hives inside and outside of mouth.     REVIEW OF SYSTEMS:   '[X]'  denotes positive finding, '[ ]'  denotes negative finding Cardiac  Comments:  Chest pain or chest pressure:    Shortness of breath upon exertion:    Short of breath when lying flat:    Irregular heart rhythm:        Vascular    Pain in calf, thigh, or hip brought on by ambulation: x  see HPI  Pain in feet at night that wakes you up from your sleep:     Blood clot in your veins:    Leg swelling:         Pulmonary    Oxygen at home:    Productive cough:     Wheezing:         Neurologic    Sudden weakness in arms or legs:     Sudden numbness in arms or legs:     Sudden onset of difficulty speaking or slurred speech:    Temporary loss of vision in one eye:     Problems with dizziness:          Gastrointestinal    Blood in stool:     Vomited blood:         Genitourinary    Burning when urinating:     Blood in urine:        Psychiatric    Major depression:         Hematologic    Bleeding problems:    Problems with blood clotting too easily:        Skin    Rashes or ulcers:        Constitutional    Fever or chills:      PHYSICAL EXAMINATION:  Vitals:   08/04/19 0844  BP: (!) 194/90  Pulse: 83  Resp: 20  Temp: (!) 97 F (36.1 C)  SpO2: 98%   General:  WDWN in NAD; vital signs documented above Gait: normal; unaided HENT: WNL, normocephalic Pulmonary: normal non-labored breathing , without Rales, rhonchi,  wheezing Cardiac: regular HR, with  Murmur without carotid bruit Abdomen: soft, NT, no masses Skin: with reticular veins, erythematous patches Vascular Exam/Pulses: 1+ bilateral dorsalis pedis pulses, 2+ bilateral femoral, radial and brachial pulses. Extremities: without ischemic changes, without Gangrene , without cellulitis; without open wounds;  Musculoskeletal: no muscle wasting or atrophy  Neurologic: A&O X 3;  No focal weakness or paresthesias are detected Psychiatric:  The pt has Normal affect.  07/01/2019 arteriogram findings: Findings:  Aortogram shows a diseased distal infrarenal aorta with approximate 50% stenosis from calcification but the iliacs themselves appear patent.  She does have excellent left femoral pulse distally.  Left common femoral and profunda are widely patent and the SFA is moderately diseased throughout its course without any flow-limiting stenosis.  Approximate 50% stenosis at Hunter's canal.  Approximate 50% stenosis at popliteal artery behind the knee.  Patient has single-vessel runoff via peroneal artery with a greater than 60 to 70% stenosis in the TP trunk as well as at least 3 stenosis high-grade in the distal peroneal artery.  Even though she has single-vessel peroneal runoff she has very brisk inflow down the  left lower extremity with filling of the distal AT and dorsalis pedis at the ankle through a collateral.  Ultimately after evaluating the pictures, I elected not to intervene given single-vessel peroneal runoff in the setting of claudication.  Discussed with the patient I would let her symptoms progress before potentially intervening on single-vessel peroneal runoff given her symptoms at this time and no evidence of critical limb ischemia.               Non-Invasive Vascular Imaging:   08/04/2019: ABI/TBI Today's ABI Today's TBI Previous ABI Previous TBI   +-------+-----------+-----------+------------+------------+   Right  0.76     0.46     0.78     0.42       +-------+-----------+-----------+------------+------------+   Left   0.87     0.40     0.71     0.48       +-------+-----------+-----------+------------+------------+   Right TP = 90 Left TP = 77  06/09/2019: ABI/TBI Today's ABI Today's TBI Previous ABI Previous TBI   +-------+-----------+-----------+------------+------------+   Right  0.78     0.42                     +-------+-----------+-----------+------------+------------+   Left   0.71     0.48                     Left toe pressure: 91  ASSESSMENT/PLAN:: 79 y.o. female here for follow up for LLE arteriography in the setting of lifestyle limiting claudication of the LLE.  Her symptoms are stable.  She has no skin breakdown and has palpable pedal pulses.  I discussed her side effects of Plavix with Dr. Carlis Abbott.  Okay to discontinue Plavix but continue aspirin therapy daily.  I advised the patient to return to the clinic sooner for worsening lower extremity pain or issues with nonhealing skin wounds.  Continue asa and discontinue Plavix.  Medical management of hypertension and DM.  Follow-up in 6 months with ABIs and right lower extremity duplex.  Barbie Banner, PA-C [facility] [phone]  Clinic MD:   Carlis Abbott

## 2019-08-05 ENCOUNTER — Other Ambulatory Visit: Payer: Self-pay | Admitting: *Deleted

## 2019-08-05 ENCOUNTER — Encounter: Payer: Self-pay | Admitting: Family Medicine

## 2019-08-05 ENCOUNTER — Ambulatory Visit (INDEPENDENT_AMBULATORY_CARE_PROVIDER_SITE_OTHER): Payer: Medicare Other | Admitting: Family Medicine

## 2019-08-05 VITALS — BP 120/70 | HR 95 | Temp 97.1°F | Ht 63.0 in | Wt 145.4 lb

## 2019-08-05 DIAGNOSIS — E1165 Type 2 diabetes mellitus with hyperglycemia: Secondary | ICD-10-CM | POA: Diagnosis not present

## 2019-08-05 DIAGNOSIS — E1122 Type 2 diabetes mellitus with diabetic chronic kidney disease: Secondary | ICD-10-CM

## 2019-08-05 DIAGNOSIS — E1129 Type 2 diabetes mellitus with other diabetic kidney complication: Secondary | ICD-10-CM

## 2019-08-05 DIAGNOSIS — IMO0002 Reserved for concepts with insufficient information to code with codable children: Secondary | ICD-10-CM

## 2019-08-05 DIAGNOSIS — I10 Essential (primary) hypertension: Secondary | ICD-10-CM | POA: Diagnosis not present

## 2019-08-05 DIAGNOSIS — G479 Sleep disorder, unspecified: Secondary | ICD-10-CM | POA: Diagnosis not present

## 2019-08-05 DIAGNOSIS — I739 Peripheral vascular disease, unspecified: Secondary | ICD-10-CM

## 2019-08-05 DIAGNOSIS — N184 Chronic kidney disease, stage 4 (severe): Secondary | ICD-10-CM

## 2019-08-05 LAB — HEMOGLOBIN A1C: Hgb A1c MFr Bld: 9.3 % — ABNORMAL HIGH (ref 4.6–6.5)

## 2019-08-05 NOTE — Patient Instructions (Signed)
Try melatonin 3mg  about 30-68min before bedtime Available OTC at [facility], [employer], etc

## 2019-08-05 NOTE — Progress Notes (Signed)
[name] is a 79 y.o. female  Chief Complaint  Patient presents with  . Follow-up    Pt here for a 3 month F/U.  A1C check, pt is fasting.  Pt would also like to discuss sleeping habits. She has not been falling asleep at a decent time lately, x 1year.    HPI: [name] is a 79 y.o. female here for routine f/u on DM, HTN. She is due for A1C. She is taking metformin 553m qAM, glipizide 186mdaily, januvia 10176HYaily, trulicity 0.0.73XTeekly.  Pt complains of trouble falling asleep x 1 year and getting worse. She is able to stay asleep once she falls asleep. She does not feel worried, anxious. She has not tried taking any OTC.   She denies HA, dizziness, vision changes, CP, SOB, palpitations, LE edema. Appetite is good.   Lab Results  Component Value Date   HGBA1C 8.0 (H) 04/29/2019     Past Medical History:  Diagnosis Date  . Allergy   . Anemia    childhood  . Anxiety   . Arterial stenosis (HCC)    mesenteric  . Arthritis   . Breast cancer ([facility]   R breast, inv mammary, in situ,ER/PR +,HER2 -  . Cancer ([facility]  . Carcinoma of breast [place] with adjuvant chemotherapy ([place]  . Coronary artery disease    stent 2007  . Depression   . Diabetes mellitus   . Difficulty sleeping   . Diverticulosis 12/10/03  . Elevated cholesterol   . Generalized weakness   . GERD (gastroesophageal reflux disease)   . Heart murmur    secondary to chem. 2013  . Hepatitis    as an infant  . HSV-1 (herpes simplex virus 1) infection   . Hyperlipidemia   . Hypertension   . Hypothyroidism   . Osteoarthritis   . Personal history of chemotherapy   . Personal history of radiation therapy   . Pneumonia    2014 september  . 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 Spinal stenosis   . Thyroid disease     Past Surgical History:  Procedure Laterality Date  . ABDOMINAL AORTOGRAM W/LOWER EXTREMITY Bilateral 04/29/2019   Procedure: ABDOMINAL AORTOGRAM  W/LOWER EXTREMITY;  Surgeon: ClMarty HeckMD;  [facility];  Service: Cardiovascular;  Laterality: Bilateral;  . ABDOMINAL AORTOGRAM W/LOWER EXTREMITY Left 07/01/2019   Procedure: ABDOMINAL AORTOGRAM W/ Left LOWER EXTREMITY [place];  Surgeon: ClMarty HeckMD;  [facility];  Service: Cardiovascular;  Laterality: Left;  . ANTERIOR AND POSTERIOR REPAIR N/A 04/05/2016   Procedure: ANTERIOR (CYSTOCELE) AND POSTERIOR REPAIR (RECTOCELE);  Surgeon: GrMarylynn PearsonMD;  [facility];  Service: Gynecology;  Laterality: N/A;  . BREAST BIOPSY    . BREAST LUMPECTOMY  1983   benign biopsy  . BREAST LUMPECTOMY Right 12/29/2010   snbx, ER/PR +, Her2 -, 0/1 node pos.  . CAROTID STENT  2007  . CARPAL TUNNEL RELEASE Bilateral 9/12,8,12  . CHOLECYSTECTOMY    . COLONOSCOPY N/A 02/09/2013   Procedure: COLONOSCOPY;  Surgeon: DoLafayette DragonMD;  [facility];  Service: Endoscopy;  Laterality: N/A;  . COLONOSCOPY    . CORONARY ANGIOPLASTY    . DILATION AND CURETTAGE OF UTERUS    . ESOPHAGOGASTRODUODENOSCOPY N/A 02/09/2013   Procedure: ESOPHAGOGASTRODUODENOSCOPY (EGD);  Surgeon: DoLafayette DragonMD;  [facility];  Service: Endoscopy;  Laterality: N/A;  .  FOOT SPURS    . HYSTEROSCOPY WITH D & C N/A 09/11/2012   Procedure: DILATATION AND CURETTAGE ;  Surgeon: [name], MD;  Location: [facility];  Service: Gynecology;  Laterality: N/A;  . KNEE SURGERY  2007  . LAPAROSCOPIC ASSISTED VAGINAL HYSTERECTOMY N/A 04/05/2016   Procedure: LAPAROSCOPIC ASSISTED VAGINAL HYSTERECTOMY possible BSO;  Surgeon: [name], MD;  Location: [facility];  Service: Gynecology;  Laterality: N/A;  . LUMBAR LAMINECTOMY/DECOMPRESSION MICRODISCECTOMY N/A 10/20/2014   Procedure: MICRO LUMBER DECOMPRESSION L3-4 L4-5;  Surgeon: [name], MD;  [facility];  Service: Orthopedics;  Laterality: N/A;  . LUMBAR LAMINECTOMY/DECOMPRESSION MICRODISCECTOMY Bilateral 10/13/2015     Procedure: MICRO LUMBAR DECOMPRESSION L5 - S1 AND REDO DECOMPRESSION L4 - L5 AND REMOVAL OF FACET CYST L4 - L5 2 LEVELS;  Surgeon: [name], MD;  Location: [facility];  Service: Orthopedics;  Laterality: Bilateral;  . PERIPHERAL VASCULAR INTERVENTION Right 04/29/2019   Procedure: PERIPHERAL VASCULAR INTERVENTION;  Surgeon: Marty Heck, MD;  Location: [facility];  Service: Cardiovascular;  Laterality: Right;  . PORT-A-CATH REMOVAL  04/17/2011   Procedure: REMOVAL PORT-A-CATH;  Surgeon: [name], MD;  Location: [facility];  Service: General;  Laterality: Left;  . PORTACATH PLACEMENT  02/07/2011   Procedure: INSERTION PORT-A-CATH;  Surgeon: [name], MD;  [facility];  Service: General;  Laterality: N/A;  . UPPER GASTROINTESTINAL ENDOSCOPY      Social History   Socioeconomic History  . Marital status: Married    Spouse name: Not on file  . Number of children: 2  . Years of education: Not on file  . Highest education level: Not on file  Occupational History  . Occupation: retired  Tobacco Use  . Smoking status: Former Smoker    Packs/day: 2.00    Years: 40.00    Pack years: 80.00    Quit date: 12/19/1997    Years since quitting: 21.6  . Smokeless tobacco: Never Used  Vaping Use  . Vaping Use: Never used  Substance and Sexual Activity  . Alcohol use: Yes    Alcohol/week: 6.0 standard drinks    Types: 6 Glasses of wine per week    Comment: occasional  . Drug use: No  . Sexual activity: Not on file    Comment: menarch age 80, P3, HRT X 10 YRS, MENOPAUSE MID 40'S  Other Topics Concern  . Not on file  Social History Narrative   [name] age 29- [place] age 54  [place]   Social Determinants of Health   Financial Resource Strain:   . Difficulty of Paying Living Expenses:   Food Insecurity:   . Worried About Charity fundraiser in the Last Year:   . Arboriculturist in the Last Year:   Transportation Needs:   .  Film/video editor (Medical):   Marland Kitchen Lack of Transportation (Non-Medical):   Physical Activity:   . Days of Exercise per Week:   . Minutes of Exercise per Session:   Stress:   . Feeling of Stress :   Social Connections:   . Frequency of Communication with Friends and Family:   . Frequency of Social Gatherings with Friends and Family:   . Attends Religious Services:   . Active Member of Clubs or Organizations:   . Attends Archivist Meetings:   Marland Kitchen Marital Status:   Intimate Partner Violence:   . Fear of Current or Ex-Partner:   . Emotionally Abused:   .  Physically Abused:   . Sexually Abused:     Family History  Problem Relation Age of Onset  . Throat cancer Father   . Alcoholism Father   . Heart attack Father   . Heart disease Mother   . Lymphoma Brother 69  . Cancer Son   . Heart attack Brother   . Diabetes Brother   . Hypertension Brother   . Colon cancer Neg Hx   . Stroke Neg Hx   . Esophageal cancer Neg Hx   . Rectal cancer Neg Hx   . Stomach cancer Neg Hx      Immunization History  Administered Date(s) Administered  . Fluad Quad(high Dose 65+) 11/20/2018  . Influenza Split 12/07/2014  . Influenza, High Dose Seasonal PF 11/26/2016  . Influenza,inj,quad, With Preservative 02/22/2017, 11/20/2018  . Influenza-Unspecified 10/31/2009, 12/01/2010, 12/03/2011, 11/05/2012, 11/05/2013, 10/25/2014, 12/07/2014, 11/10/2015, 11/27/2017  . PFIZER SARS-COV-2 Vaccination 02/12/2019, 03/05/2019  . Pneumococcal Conjugate-13 07/30/2014  . Pneumococcal Polysaccharide-23 11/19/2007, 11/15/2008  . Pneumococcal-Unspecified 01/23/2010  . Zoster Recombinat (Shingrix) 02/01/2011    Outpatient Encounter Medications as of 08/05/2019  Medication Sig Note  . acetaminophen (TYLENOL) 500 MG tablet Take 1,000 mg by mouth every 6 (six) hours as needed for moderate pain or headache.   Marland Kitchen aspirin EC 81 MG tablet Take 81 mg by mouth at bedtime.   . calcium carbonate (TUMS - DOSED IN  MG ELEMENTAL CALCIUM) 500 MG chewable tablet Chew 2 tablets by mouth 3 (three) times daily as needed for indigestion or heartburn.   . chlorthalidone (HYGROTON) 25 MG tablet TAKE 1/2 TABLET(12.5 MG) BY MOUTH EVERY DAY FOR 3 DAYS THEN TAKE 1 TABLET(25 MG) BY MOUTH EVERY DAY (Patient taking differently: Take 25 mg by mouth daily as needed (elevated blood pressure.). ) 04/22/2019: Pt can tell when she starts getting a headache that's related to her high bp  . cholecalciferol (VITAMIN D) 25 MCG (1000 UNIT) tablet Take 1,000 Units by mouth daily.   . clobetasol cream (TEMOVATE) 8.75 % Apply 1 application topically 2 (two) times daily. (Patient taking differently: Apply 1 application topically 2 (two) times daily as needed (itching). )   . clopidogrel (PLAVIX) 75 MG tablet Take 1 tablet (75 mg total) by mouth daily with breakfast.   . Dulaglutide (TRULICITY) 6.43 PI/9.5JO SOPN Inject 0.5 mLs (0.75 mg total) into the skin every Saturday.   . ferrous sulfate (FERROUSUL) 325 (65 FE) MG tablet Take 1 tablet (325 mg total) by mouth daily with breakfast. (Patient taking differently: Take 325 mg by mouth every other day. )   . glipiZIDE (GLUCOTROL XL) 10 MG 24 hr tablet Take 1 tablet (10 mg total) by mouth daily with breakfast. (Patient taking differently: Take 10 mg by mouth daily at 12 noon. )   . glucose blood (ACCU-CHEK GUIDE) test strip TEST ONCE DAILY   . Golimumab ([place] ARIA IV) Inject 1 Dose into the vein See admin instructions. Every 2 months.   Marland Kitchen JANUVIA 100 MG tablet TAKE 1 TABLET BY MOUTH  DAILY (Patient taking differently: Take 100 mg by mouth at bedtime. )   . Lancet Device MISC 100 each by Does not apply route daily. UAD for daily glucose monitoring   . Lancets Misc. (ACCU-CHEK MULTICLIX LANCET DEV) KIT Use as directed to check sugars   . levothyroxine (SYNTHROID) 88 MCG tablet Take 1 tablet (88 mcg total) by mouth daily.   Marland Kitchen losartan (COZAAR) 50 MG tablet TAKE 1 TABLET BY MOUTH  DAILY (Patient  taking differently: Take 50 mg by mouth daily. )   . metFORMIN (GLUCOPHAGE) 500 MG tablet Take 1 tablet (500 mg total) by mouth daily with breakfast.   . nitroGLYCERIN (NITROSTAT) 0.4 MG SL tablet Place 1 tablet (0.4 mg total) under the tongue every 5 (five) minutes as needed for chest pain.   . Omega-3 Fatty Acids (FISH OIL) 1000 MG CAPS Take 1,000 mg by mouth in the morning and at bedtime.   . pantoprazole (PROTONIX) 40 MG tablet TAKE 1 TABLET BY MOUTH  DAILY (Patient taking differently: Take 40 mg by mouth daily. )   . PARoxetine (PAXIL) 20 MG tablet TAKE 1 TABLET BY MOUTH EVERY DAY IN THE MORNING (Patient taking differently: Take 20 mg by mouth daily. )   . valACYclovir (VALTREX) 1000 MG tablet TAKE 1 TABLET BY MOUTH 2  TIMES DAILY AS NEEDED FOR  VIRAL INFECTION. (Patient taking differently: Take 1,000 mg by mouth 2 (two) times daily as needed (fever blisters). )   . vitamin B-12 (CYANOCOBALAMIN) 1000 MCG tablet Take 1,000 mcg by mouth daily.    No facility-administered encounter medications on file as of 08/05/2019.     ROS: Pertinent positives and negatives noted in HPI. Remainder of ROS non-contributory    Allergies  Allergen Reactions  . Codeine Nausea And Vomiting and Other (See Comments)    Severe stomach cramps  . Antihistamines, Diphenhydramine-Type Other (See Comments)    Causes hyperactivity  . Gabapentin     Urinary incontinence  . Statins Other (See Comments)    Joint pains  . Sulfa Antibiotics     Unknown to pt  . Erythromycin Hives and Rash    Due to dental work about 50 years ago. Was used in packing and resulted in rash/hives inside and outside of mouth.    BP 120/70 (BP Location: Left Arm, Patient Position: Sitting, Cuff Size: Normal)   Pulse 95   Temp (!) 97.1 F (36.2 C) (Temporal)   Ht _0  (1.6 m)   Wt 145 lb 6.4 oz (66 kg)   SpO2 97%   BMI 25.76 kg/m   BP Readings from Last 3 Encounters:  08/05/19 120/70  08/04/19 (!) 194/90  07/01/19 140/65     Physical Exam Vitals reviewed.  Constitutional:      Appearance: Normal appearance.  Cardiovascular:     Rate and Rhythm: Normal rate and regular rhythm.     Pulses: Normal pulses.  Pulmonary:     Effort: Pulmonary effort is normal. No respiratory distress.     Breath sounds: Normal breath sounds.  Skin:    General: Skin is warm and dry.  Neurological:     General: No focal deficit present.     Mental Status: She is alert and oriented to person, place, and time.  Psychiatric:        Mood and Affect: Mood normal.        Behavior: Behavior normal.      A/P:  1. Type 2 diabetes, uncontrolled, with renal manifestation (HCC) - last A1C = 8.0 - no hypo or hyperglycemia episodes  - fasting BS = 140-180 - for now, cont current meds - Hemoglobin A1c - f/u in 3 mo or sooner PRN  2. Essential hypertension - controlled, at goal - cont current med  3. Sleep trouble - symptoms x 1 year - recommend trial of melatonin 91m qHS  4. 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[facility] - stable   This visit  occurred during the SARS-CoV-2 public health emergency.  Safety protocols were in place, including screening questions prior to the visit, additional usage of staff PPE, and extensive cleaning of exam room while observing appropriate contact time as indicated for disinfecting solutions.

## 2019-08-07 ENCOUNTER — Other Ambulatory Visit: Payer: Self-pay | Admitting: Family Medicine

## 2019-08-07 DIAGNOSIS — E1129 Type 2 diabetes mellitus with other diabetic kidney complication: Secondary | ICD-10-CM

## 2019-08-07 DIAGNOSIS — IMO0002 Reserved for concepts with insufficient information to code with codable children: Secondary | ICD-10-CM

## 2019-08-07 MED ORDER — TRULICITY 1.5 MG/0.5ML ~~LOC~~ SOAJ: 1.5000 mg | SUBCUTANEOUS | 3 refills | Status: DC

## 2019-08-10 ENCOUNTER — Telehealth: Payer: Self-pay | Admitting: Family Medicine

## 2019-08-10 ENCOUNTER — Other Ambulatory Visit: Payer: Self-pay

## 2019-08-10 MED ORDER — PANTOPRAZOLE SODIUM 40 MG PO TBEC: 40.0000 mg | DELAYED_RELEASE_TABLET | Freq: Every day | ORAL | 1 refills | Status: DC

## 2019-08-10 NOTE — Telephone Encounter (Signed)
Patient is calling regarding her lab results. She said that she has not heard back from her provider yet. Pleaser call her at [phone].

## 2019-08-10 NOTE — Telephone Encounter (Signed)
Last OV 08/05/19 Last fill 10/21/18  #90/1

## 2019-08-10 NOTE — Telephone Encounter (Signed)
Notified patient of lab results.  Patient verbalized understanding.  

## 2019-08-13 ENCOUNTER — Telehealth: Payer: Self-pay | Admitting: Family Medicine

## 2019-08-13 NOTE — Telephone Encounter (Signed)
error 

## 2019-08-13 NOTE — Telephone Encounter (Signed)
Pt said she does not need to talk to Dr C, She said she wanted to let you know

## 2019-08-14 ENCOUNTER — Other Ambulatory Visit: Payer: Self-pay

## 2019-08-14 MED ORDER — PAROXETINE HCL 20 MG PO TABS: ORAL_TABLET | ORAL | 1 refills | Status: DC

## 2019-08-14 NOTE — Telephone Encounter (Signed)
Last Ov 08/05/19 Last fill 01/02/19  #90/1

## 2019-08-19 ENCOUNTER — Other Ambulatory Visit: Payer: Self-pay

## 2019-08-19 ENCOUNTER — Ambulatory Visit (INDEPENDENT_AMBULATORY_CARE_PROVIDER_SITE_OTHER): Payer: Medicare Other

## 2019-08-19 VITALS — Ht 63.0 in | Wt 142.0 lb

## 2019-08-19 DIAGNOSIS — Z0000 Encounter for general adult medical examination without abnormal findings: Secondary | ICD-10-CM | POA: Diagnosis not present

## 2019-08-19 MED ORDER — ACCU-CHEK FASTCLIX LANCETS MISC: 3 refills | Status: DC

## 2019-08-19 NOTE — Patient Instructions (Signed)
[name] , Thank you for taking time to complete your Medicare Wellness Visit. I appreciate your ongoing commitment to your health goals. Please review the following plan we discussed and let me know if I can assist you in the future.   Screening recommendations/referrals: Colonoscopy: No longer indicated Mammogram: Completed in 2021 at GYN office per our conversation. Please have a copy sen to PCP. Repeat every year Bone Density: Completed at GYN office per our conversation.Please have a copy sent to PCP. Repeat every 2 years Recommended yearly ophthalmology/optometry visit for glaucoma screening and checkup Recommended yearly dental visit for hygiene and checkup  Vaccinations: Influenza vaccine: Up to Date-Due 09/2019 Pneumococcal vaccine: Completed vaccines Tdap vaccine: Discuss with pharmacy Shingles vaccine: Discuss with pharmacy  Covid-19:Completed vaccines  Advanced directives: Copy on file  Conditions/risks identified: See problem list  Next appointment: Follow up in one year for your annual wellness visit    Preventive Care 65 Years and Older, Female Preventive care refers to lifestyle choices and visits with your health care provider that can promote health and wellness. What does preventive care include?  A yearly physical exam. This is also called an annual well check.  Dental exams once or twice a year.  Routine eye exams. Ask your health care provider how often you should have your eyes checked.  Personal lifestyle choices, including:  Daily care of your teeth and gums.  Regular physical activity.  Eating a healthy diet.  Avoiding tobacco and drug use.  Limiting alcohol use.  Practicing safe sex.  Taking low-dose aspirin every day.  Taking vitamin and mineral supplements as recommended by your health care provider. What happens during an annual well check? The services and screenings done by your health care provider during your annual well check  will depend on your age, overall health, lifestyle risk factors, and family history of disease. Counseling  Your health care provider may ask you questions about your:  Alcohol use.  Tobacco use.  Drug use.  Emotional well-being.  Home and relationship well-being.  Sexual activity.  Eating habits.  History of falls.  Memory and ability to understand (cognition).  Work and work Statistician.  Reproductive health. Screening  You may have the following tests or measurements:  Height, weight, and BMI.  Blood pressure.  Lipid and cholesterol levels. These may be checked every 5 years, or more frequently if you are over 31 years old.  Skin check.  Lung cancer screening. You may have this screening every year starting at age 36 if you have a 30-pack-year history of smoking and currently smoke or have quit within the past 15 years.  Fecal occult blood test (FOBT) of the stool. You may have this test every year starting at age 36.  Flexible sigmoidoscopy or colonoscopy. You may have a sigmoidoscopy every 5 years or a colonoscopy every 10 years starting at age 43.  Hepatitis C blood test.  Hepatitis B blood test.  Sexually transmitted disease (STD) testing.  Diabetes screening. This is done by checking your blood sugar (glucose) after you have not eaten for a while (fasting). You may have this done every 1-3 years.  Bone density scan. This is done to screen for osteoporosis. You may have this done starting at age 73.  Mammogram. This may be done every 1-2 years. Talk to your health care provider about how often you should have regular mammograms. Talk with your health care provider about your test results, treatment options, and if necessary, the need  for more tests. Vaccines  Your health care provider may recommend certain vaccines, such as:  Influenza vaccine. This is recommended every year.  Tetanus, diphtheria, and acellular pertussis (Tdap, Td) vaccine. You may  need a Td booster every 10 years.  Zoster vaccine. You may need this after age 74.  Pneumococcal 13-valent conjugate (PCV13) vaccine. One dose is recommended after age 75.  Pneumococcal polysaccharide (PPSV23) vaccine. One dose is recommended after age 35. Talk to your health care provider about which screenings and vaccines you need and how often you need them. This information is not intended to replace advice given to you by your health care provider. Make sure you discuss any questions you have with your health care provider. Document Released: 02/04/2015 Document Revised: 09/28/2015 Document Reviewed: 11/09/2014 Elsevier Interactive Patient Education  2017 [place] Prevention in the Home Falls can cause injuries. They can happen to people of all ages. There are many things you can do to make your home safe and to help prevent falls. What can I do on the outside of my home?  Regularly fix the edges of walkways and driveways and fix any cracks.  Remove anything that might make you trip as you walk through a door, such as a raised step or threshold.  Trim any bushes or trees on the path to your home.  Use bright outdoor lighting.  Clear any walking paths of anything that might make someone trip, such as rocks or tools.  Regularly check to see if handrails are loose or broken. Make sure that both sides of any steps have handrails.  Any raised decks and porches should have guardrails on the edges.  Have any leaves, snow, or ice cleared regularly.  Use sand or salt on walking paths during winter.  Clean up any spills in your garage right away. This includes oil or grease spills. What can I do in the bathroom?  Use night lights.  Install grab bars by the toilet and in the tub and shower. Do not use towel bars as grab bars.  Use non-skid mats or decals in the tub or shower.  If you need to sit down in the shower, use a plastic, non-slip stool.  Keep the floor  dry. Clean up any water that spills on the floor as soon as it happens.  Remove soap buildup in the tub or shower regularly.  Attach bath mats securely with double-sided non-slip rug tape.  Do not have throw rugs and other things on the floor that can make you trip. What can I do in the bedroom?  Use night lights.  Make sure that you have a light by your bed that is easy to reach.  Do not use any sheets or blankets that are too big for your bed. They should not hang down onto the floor.  Have a firm chair that has side arms. You can use this for support while you get dressed.  Do not have throw rugs and other things on the floor that can make you trip. What can I do in the kitchen?  Clean up any spills right away.  Avoid walking on wet floors.  Keep items that you use a lot in easy-to-reach places.  If you need to reach something above you, use a strong step stool that has a grab bar.  Keep electrical cords out of the way.  Do not use floor polish or wax that makes floors slippery. If you must use wax, use  non-skid floor wax.  Do not have throw rugs and other things on the floor that can make you trip. What can I do with my stairs?  Do not leave any items on the stairs.  Make sure that there are handrails on both sides of the stairs and use them. Fix handrails that are broken or loose. Make sure that handrails are as long as the stairways.  Check any carpeting to make sure that it is firmly attached to the stairs. Fix any carpet that is loose or worn.  Avoid having throw rugs at the top or bottom of the stairs. If you do have throw rugs, attach them to the floor with carpet tape.  Make sure that you have a light switch at the top of the stairs and the bottom of the stairs. If you do not have them, ask someone to add them for you. What else can I do to help prevent falls?  Wear shoes that:  Do not have high heels.  Have rubber bottoms.  Are comfortable and fit you  well.  Are closed at the toe. Do not wear sandals.  If you use a stepladder:  Make sure that it is fully opened. Do not climb a closed stepladder.  Make sure that both sides of the stepladder are locked into place.  Ask someone to hold it for you, if possible.  Clearly mark and make sure that you can see:  Any grab bars or handrails.  First and last steps.  Where the edge of each step is.  Use tools that help you move around (mobility aids) if they are needed. These include:  Canes.  Walkers.  Scooters.  Crutches.  Turn on the lights when you go into a dark area. Replace any light bulbs as soon as they burn out.  Set up your furniture so you have a clear path. Avoid moving your furniture around.  If any of your floors are uneven, fix them.  If there are any pets around you, be aware of where they are.  Review your medicines with your doctor. Some medicines can make you feel dizzy. This can increase your chance of falling. Ask your doctor what other things that you can do to help prevent falls. This information is not intended to replace advice given to you by your health care provider. Make sure you discuss any questions you have with your health care provider. Document Released: 11/04/2008 Document Revised: 06/16/2015 Document Reviewed: 02/12/2014 Elsevier Interactive Patient Education  2017 [employer].

## 2019-08-19 NOTE — Progress Notes (Signed)
Subjective:   [name] is a 79 y.o. female who presents for an Initial Medicare Annual Wellness Visit.  I connected with Islam today by telephone and verified that I am speaking with the correct person using two identifiers. Location patient: home Location provider: work Persons participating in the virtual visit: patient, Marine scientist.    I discussed the limitations, risks, security and privacy concerns of performing an evaluation and management service by telephone and the availability of in person appointments. I also discussed with the patient that there may be a patient responsible charge related to this service. The patient expressed understanding and verbally consented to this telephonic visit.    Interactive audio and video telecommunications were attempted between this provider and patient, however failed, due to patient having technical difficulties OR patient did not have access to video capability.  We continued and completed visit with audio only.  Some vital signs may be absent or patient reported.   Time Spent with patient on telephone encounter: 30 minutes  Review of Systems      Cardiac Risk Factors include: dyslipidemia;advanced age (>29mn, >>19women);diabetes mellitus;hypertension;sedentary lifestyle     Objective:    Today's Vitals   08/19/19 1506  Weight: 142 lb (64.4 kg)  Height: 5' 3" (1.6 m)   Body mass index is 25.15 kg/m.  Advanced Directives 08/19/2019 07/01/2019 04/29/2019 04/05/2016 04/05/2016 03/26/2016 11/14/2015  Does Patient Have a Medical Advance Directive? Yes Yes Yes - Yes Yes Yes  Type of Advance Directive [place] - Living will [place]Living will Living will;Healthcare Power of Attorney  Does patient want to make changes to medical advance directive? - No - Patient declined No - Patient declined No - Patient  declined - No - Patient declined -  Copy of [place] Chart? Yes - validated most recent copy scanned in chart (See row information) No - copy requested No - copy requested - - No - copy requested -    Current Medications (verified) Outpatient Encounter Medications as of 08/19/2019  Medication Sig  . Accu-Chek FastClix Lancets MISC Use to check blood sugars  . acetaminophen (TYLENOL) 500 MG tablet Take 1,000 mg by mouth every 6 (six) hours as needed for moderate pain or headache.  .Marland Kitchenaspirin EC 81 MG tablet Take 81 mg by mouth at bedtime.  . calcium carbonate (TUMS - DOSED IN MG ELEMENTAL CALCIUM) 500 MG chewable tablet Chew 2 tablets by mouth 3 (three) times daily as needed for indigestion or heartburn.  . chlorthalidone (HYGROTON) 25 MG tablet TAKE 1/2 TABLET(12.5 MG) BY MOUTH EVERY DAY FOR 3 DAYS THEN TAKE 1 TABLET(25 MG) BY MOUTH EVERY DAY (Patient taking differently: Take 25 mg by mouth daily as needed (elevated blood pressure.). )  . cholecalciferol (VITAMIN D) 25 MCG (1000 UNIT) tablet Take 1,000 Units by mouth daily.  . clobetasol cream (TEMOVATE) 07.67% Apply 1 application topically 2 (two) times daily. (Patient taking differently: Apply 1 application topically 2 (two) times daily as needed (itching). )  . Dulaglutide (TRULICITY) 1.5 MMC/9.4BSSOPN Inject 0.5 mLs (1.5 mg total) into the skin once a week.  . ferrous sulfate (FERROUSUL) 325 (65 FE) MG tablet Take 1 tablet (325 mg total) by mouth daily with breakfast. (Patient taking differently: Take 325 mg by mouth every other day. )  . glipiZIDE (GLUCOTROL XL) 10 MG 24 hr tablet Take 1 tablet (  10 mg total) by mouth daily with breakfast. (Patient taking differently: Take 10 mg by mouth daily at 12 noon. )  . glucose blood (ACCU-CHEK GUIDE) test strip TEST ONCE DAILY  . JANUVIA 100 MG tablet TAKE 1 TABLET BY MOUTH  DAILY (Patient taking differently: Take 100 mg by mouth at bedtime. )  . Lancet Device MISC 100 each by Does  not apply route daily. UAD for daily glucose monitoring  . Lancets Misc. (ACCU-CHEK MULTICLIX LANCET DEV) KIT Use as directed to check sugars  . leflunomide (ARAVA) 20 MG tablet Take 20 mg by mouth daily.  Marland Kitchen levothyroxine (SYNTHROID) 88 MCG tablet Take 1 tablet (88 mcg total) by mouth daily.  Marland Kitchen losartan (COZAAR) 50 MG tablet TAKE 1 TABLET BY MOUTH  DAILY (Patient taking differently: Take 50 mg by mouth daily. )  . metFORMIN (GLUCOPHAGE) 500 MG tablet Take 1 tablet (500 mg total) by mouth daily with breakfast.  . nitroGLYCERIN (NITROSTAT) 0.4 MG SL tablet Place 1 tablet (0.4 mg total) under the tongue every 5 (five) minutes as needed for chest pain.  . Omega-3 Fatty Acids (FISH OIL) 1000 MG CAPS Take 1,000 mg by mouth in the morning and at bedtime.  . pantoprazole (PROTONIX) 40 MG tablet Take 1 tablet (40 mg total) by mouth daily.  Marland Kitchen PARoxetine (PAXIL) 20 MG tablet TAKE 1 TABLET BY MOUTH EVERY DAY IN THE MORNING  . valACYclovir (VALTREX) 1000 MG tablet TAKE 1 TABLET BY MOUTH 2  TIMES DAILY AS NEEDED FOR  VIRAL INFECTION. (Patient taking differently: Take 1,000 mg by mouth 2 (two) times daily as needed (fever blisters). )  . vitamin B-12 (CYANOCOBALAMIN) 1000 MCG tablet Take 1,000 mcg by mouth daily.  . [DISCONTINUED] clopidogrel (PLAVIX) 75 MG tablet Take 1 tablet (75 mg total) by mouth daily with breakfast.  . Golimumab (SIMPONI ARIA IV) Inject 1 Dose into the vein See admin instructions. Every 2 months. (Patient not taking: Reported on 08/19/2019)   No facility-administered encounter medications on file as of 08/19/2019.    Allergies (verified) Codeine; Antihistamines, diphenhydramine-type; Gabapentin; Statins; Sulfa antibiotics; and Erythromycin   History: Past Medical History:  Diagnosis Date  . Allergy   . Anemia    childhood  . Anxiety   . Arterial stenosis (HCC)    mesenteric  . Arthritis   . Breast cancer ([place]) 12/05/10   R breast, inv mammary, in situ,ER/PR +,HER2 -  . Cancer  ([place])   . Carcinoma of breast treated with adjuvant chemotherapy ([place])   . Coronary artery disease    stent 2007  . Depression   . Diabetes mellitus   . Difficulty sleeping   . Diverticulosis 12/10/03  . Elevated cholesterol   . Generalized weakness   . GERD (gastroesophageal reflux disease)   . Heart murmur    secondary to chem. 2013  . Hepatitis    as an infant  . HSV-1 (herpes simplex virus 1) infection   . Hyperlipidemia   . Hypertension   . Hypothyroidism   . Osteoarthritis   . Personal history of chemotherapy   . Personal history of radiation therapy   . Pneumonia    2014 september  . 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 Spinal stenosis   . Thyroid disease    Past Surgical History:  Procedure Laterality Date  . ABDOMINAL AORTOGRAM W/LOWER EXTREMITY Bilateral 04/29/2019   Procedure: ABDOMINAL AORTOGRAM W/LOWER EXTREMITY;  Surgeon: Marty Heck, MD;  [facility]: [facility]  INVASIVE CV LAB;  Service: Cardiovascular;  Laterality: Bilateral;  . ABDOMINAL AORTOGRAM W/LOWER EXTREMITY Left 07/01/2019   Procedure: ABDOMINAL AORTOGRAM W/ Left LOWER EXTREMITY [place];  Surgeon: Marty Heck, MD;  Location: [facility] CV [facility];  Service: Cardiovascular;  Laterality: Left;  . ANTERIOR AND POSTERIOR REPAIR N/A 04/05/2016   Procedure: ANTERIOR (CYSTOCELE) AND POSTERIOR REPAIR (RECTOCELE);  Surgeon: [name], MD;  Location: [facility];  Service: Gynecology;  Laterality: N/A;  . BREAST BIOPSY    . BREAST LUMPECTOMY  1983   benign biopsy  . BREAST LUMPECTOMY Right 12/29/2010   snbx, ER/PR +, Her2 -, 0/1 node pos.  . CAROTID STENT  2007  . CARPAL TUNNEL RELEASE Bilateral 9/12,8,12  . CHOLECYSTECTOMY    . COLONOSCOPY N/A 02/09/2013   Procedure: COLONOSCOPY;  Surgeon: [name], MD;  Location: [facility];  Service: Endoscopy;  Laterality: N/A;  . COLONOSCOPY    . CORONARY ANGIOPLASTY    . DILATION AND CURETTAGE OF UTERUS    . ESOPHAGOGASTRODUODENOSCOPY N/A  02/09/2013   Procedure: ESOPHAGOGASTRODUODENOSCOPY (EGD);  Surgeon: [name], MD;  Location: [name];  Service: Endoscopy;  Laterality: N/A;  . FOOT SPURS    . HYSTEROSCOPY WITH D & C N/A 09/11/2012   Procedure: DILATATION AND CURETTAGE ;  Surgeon: [name], MD;  Location: [facility];  Service: Gynecology;  Laterality: N/A;  . KNEE SURGERY  2007  . LAPAROSCOPIC ASSISTED VAGINAL HYSTERECTOMY N/A 04/05/2016   Procedure: LAPAROSCOPIC ASSISTED VAGINAL HYSTERECTOMY possible BSO;  Surgeon: [name], MD;  Location: [facility];  Service: Gynecology;  Laterality: N/A;  . LUMBAR LAMINECTOMY/DECOMPRESSION MICRODISCECTOMY N/A 10/20/2014   Procedure: MICRO LUMBER DECOMPRESSION L3-4 L4-5;  Surgeon: [name], MD;  Location: [facility];  Service: Orthopedics;  Laterality: N/A;  . LUMBAR LAMINECTOMY/DECOMPRESSION MICRODISCECTOMY Bilateral 10/13/2015   Procedure: MICRO LUMBAR DECOMPRESSION L5 - S1 AND REDO DECOMPRESSION L4 - L5 AND REMOVAL OF FACET CYST L4 - L5 2 LEVELS;  Surgeon: [name], MD;  Location: [facility];  Service: Orthopedics;  Laterality: Bilateral;  . PERIPHERAL VASCULAR INTERVENTION Right 04/29/2019   Procedure: PERIPHERAL VASCULAR INTERVENTION;  Surgeon: Marty Heck, MD;  Location: [facility];  Service: Cardiovascular;  Laterality: Right;  . PORT-A-CATH REMOVAL  04/17/2011   Procedure: REMOVAL PORT-A-CATH;  Surgeon: [name], MD;  Location: [facility];  Service: General;  Laterality: Left;  . PORTACATH PLACEMENT  02/07/2011   Procedure: INSERTION PORT-A-CATH;  Surgeon: [name], MD;  Location: [facility];  Service: General;  Laterality: N/A;  . UPPER GASTROINTESTINAL ENDOSCOPY     Family History  Problem Relation Age of Onset  . Throat cancer Father   . Alcoholism Father   . Heart attack Father   . Heart disease Mother   . Lymphoma Brother 70  . Cancer Son   . Heart attack Brother   . Diabetes Brother   . Hypertension Brother   .  Colon cancer Neg Hx   . Stroke Neg Hx   . Esophageal cancer Neg Hx   . Rectal cancer Neg Hx   . Stomach cancer Neg Hx    Social History   Socioeconomic History  . Marital status: Married    Spouse name: Not on file  . Number of children: 2  . Years of education: Not on file  . Highest education level: Not on file  Occupational History  . Occupation: retired  Tobacco Use  . Smoking status: Former Smoker    Packs/day:  2.00    Years: 40.00    Pack years: 80.00    Quit date: 12/19/1997    Years since quitting: 21.6  . Smokeless tobacco: Never Used  Vaping Use  . Vaping Use: Never used  Substance and Sexual Activity  . Alcohol use: Yes    Alcohol/week: 6.0 standard drinks    Types: 6 Glasses of wine per week    Comment: occasional  . Drug use: No  . Sexual activity: Not on file    Comment: menarch age 10, P3, HRT X 10 YRS, MENOPAUSE MID 40'S  Other Topics Concern  . Not on file  Social History Narrative   [name] age 84- [place] age 35  [place]   Social Determinants of Health   Financial Resource Strain: Low Risk   . Difficulty of Paying Living Expenses: Not hard at all  Food Insecurity: No Food Insecurity  . Worried About Charity fundraiser in the Last Year: Never true  . Ran Out of Food in the Last Year: Never true  Transportation Needs: No Transportation Needs  . Lack of Transportation (Medical): No  . Lack of Transportation (Non-Medical): No  Physical Activity: Inactive  . Days of Exercise per Week: 0 days  . Minutes of Exercise per Session: 0 min  Stress: No Stress Concern Present  . Feeling of Stress : Not at all  Social Connections: Socially Integrated  . Frequency of Communication with Friends and Family: More than three times a week  . Frequency of Social Gatherings with Friends and Family: More than three times a week  . Attends Religious Services: More than 4 times per year  . Active Member of Clubs or Organizations: Yes  . Attends English as a second language teacher Meetings: More than 4 times per year  . Marital Status: Married    Tobacco Counseling Counseling given: Not Answered   Clinical Intake:  Pre-visit preparation completed: Yes  Pain : No/denies pain     Nutritional Status: BMI 25 -29 Overweight Diabetes: Yes CBG done?: No Did pt. bring in CBG monitor from home?: No (phone visit)  How often do you need to have someone help you when you read instructions, pamphlets, or other written materials from your doctor or pharmacy?: 1 - Never What is the last grade level you completed in school?: High school grad  Nutrition Risk Assessment:  Has the patient had any N/V/D within the last 2 months?  No  Does the patient have any non-healing wounds?  No  Has the patient had any unintentional weight loss or weight gain?  No   Diabetes:  Is the patient diabetic?  Yes  If diabetic, was a CBG obtained today?  No  Did the patient bring in their glucometer from home?  No phone visit How often do you monitor your CBG's? Once a week.   Financial Strains and Diabetes Management:  Are you having any financial strains with the device, your supplies or your medication? No .  Does the patient want to be seen by Chronic Care Management for management of their diabetes?  No  Would the patient like to be referred to a Nutritionist or for Diabetic Management?  No   Diabetic Exams:  Diabetic Eye Exam: Completed 08/19/2018.  Pt has been advised about the importance in completing this exam.   Diabetic Foot Exam: Pt has been advised about the importance in completing this exam. PCP to complete.   Interpreter Needed?: No  Information entered by ::  Caroleen Hamman LPN   Activities of Daily Living In your present state of health, do you have any difficulty performing the following activities: 08/19/2019  Hearing? Y  Comment mild  Vision? N  Difficulty concentrating or making decisions? N  Walking or climbing stairs? N  Dressing or  bathing? N  Doing errands, shopping? N  Preparing Food and eating ? N  Using the Toilet? N  In the past six months, have you accidently leaked urine? Y  Comment occasionally  Do you have problems with loss of bowel control? N  Managing your Medications? N  Managing your Finances? N  Housekeeping or managing your Housekeeping? N  Some recent data might be hidden     Immunizations and Health Maintenance Immunization History  Administered Date(s) Administered  . Fluad Quad(high Dose 65+) 11/20/2018  . Influenza Split 12/07/2014  . Influenza, High Dose Seasonal PF 11/26/2016  . Influenza,inj,quad, With Preservative 02/22/2017, 11/20/2018  . Influenza-Unspecified 10/31/2009, 12/01/2010, 12/03/2011, 11/05/2012, 11/05/2013, 10/25/2014, 12/07/2014, 11/10/2015, 11/27/2017  . PFIZER SARS-COV-2 Vaccination 02/12/2019, 03/05/2019  . Pneumococcal Conjugate-13 07/30/2014  . Pneumococcal Polysaccharide-23 11/19/2007, 11/15/2008  . Pneumococcal-Unspecified 01/23/2010  . Zoster Recombinat (Shingrix) 02/01/2011   Health Maintenance Due  Topic Date Due  . Hepatitis C Screening  Never done  . TETANUS/TDAP  Never done  . FOOT EXAM  03/18/2019  . OPHTHALMOLOGY EXAM  08/19/2019    Patient Care Team: Ronnald Nian, DO as PCP - General (Family Medicine) Josue Hector, MD as PCP - Cardiology (Cardiology)  Indicate any recent Medical Services you may have received from other than [facility] providers in the past year (date may be approximate).     Assessment:   This is a routine wellness examination for Darshay.  Hearing/Vision screen  Hearing Screening   125Hz 250Hz 500Hz 1000Hz 2000Hz 3000Hz 4000Hz 6000Hz 8000Hz  Right ear:           Left ear:           Comments: Mild hearing loss-occasionally wears hearing aids  Vision Screening Comments: Reading glasses Last eye exam 07/2018 Dr Idolina Primer  Dietary issues and exercise activities discussed: Current Exercise Habits: The patient does not  participate in regular exercise at present, Exercise limited by: None identified  Goals Addressed            This Visit's Progress   . Patient Stated       Eat healthier & start walking more      Depression Screen PHQ 2/9 Scores 08/19/2019 03/31/2018 03/04/2017  PHQ - 2 Score 0 0 0    Fall Risk Fall Risk  08/19/2019 05/06/2019 03/31/2018 03/17/2018 03/04/2017  Falls in the past year? 0 0 0 0 No  Number falls in past yr: 0 - - - -  Injury with Fall? 0 - - - -  Follow up Falls prevention discussed - - Falls evaluation completed -    FALL RISK PREVENTION PERTAINING TO THE HOME:  Any stairs in or around the home? Yes  If so, are there any without handrails? No   Home free of loose throw rugs in walkways, pet beds, electrical cords, etc? Yes  Adequate lighting in your home to reduce risk of falls? Yes   ASSISTIVE DEVICES UTILIZED TO PREVENT FALLS:  Life alert? No  Use of a cane, walker or w/c? No  Grab bars in the bathroom? Yes  Shower chair or bench in shower? No  Elevated toilet seat or a handicapped toilet? No  TIMED UP AND GO:  Was the test performed? No . Phone visit    Cognitive Function: No cognitive impairment noted. Patient does crossword puzzles & Sudoku daily for brain health.        Screening Tests Health Maintenance  Topic Date Due  . Hepatitis C Screening  Never done  . TETANUS/TDAP  Never done  . FOOT EXAM  03/18/2019  . OPHTHALMOLOGY EXAM  08/19/2019  . INFLUENZA VACCINE  08/23/2019  . HEMOGLOBIN A1C  02/05/2020  . DEXA SCAN  Completed  . COVID-19 Vaccine  Completed  . PNA vac Low Risk Adult  Completed    Qualifies for Shingles Vaccine? Yes  Zostavax completed yes. Due for Shingrix. Education has been provided regarding the importance of this vaccine. Pt has been advised to call insurance company to determine out of pocket expense. Advised may also receive vaccine at local pharmacy or Health Dept. Verbalized acceptance and understanding.  Tdap:  Although this vaccine is not a covered service during a Wellness Exam, does the patient still wish to receive this vaccine today?  No . Phone visit .Education has been provided regarding the importance of this vaccine. Advised may receive this vaccine at local pharmacy or Health Dept. Aware to provide a copy of the vaccination record if obtained from local pharmacy or Health Dept. Verbalized acceptance and understanding.  Flu Vaccine:  Up to Date  Pneumococcal Vaccine: Up to Date.   COVID-19 vaccine:  Vaccines received  Cancer Screenings:  Colorectal Screening: No longer required. t.  Mammogram: Patient states she had done at GYN office this year. Requested patient have results sent to PCP  Bone Density: Patient states she had done at GYN office. Requested patient have results sent to PCP.  Lung Cancer Screening: (Low Dose CT Chest recommended if Age 54-80 years, 30 pack-year currently smoking OR have quit w/in 15years.) does not qualify.    Additional Screening:  Hepatitis C Screening:  Discuss with PCP   Dental Screening: Recommended annual dental exams for proper oral hygiene  Community Resource Referral:  CRR required this visit?  No       Plan:  I have personally reviewed and addressed the Medicare Annual Wellness questionnaire and have noted the following in the patient's chart:  A. Medical and social history B. Use of alcohol, tobacco or illicit drugs  C. Current medications and supplements D. Functional ability and status E.  Nutritional status F.  Physical activity G. Advance directives H. List of other physicians I.  Hospitalizations, surgeries, and ER visits in previous 12 months J.  [place] such as hearing and vision if needed, cognitive and depression L. Referrals and appointments   In addition, I have reviewed and discussed with patient certain preventive protocols, quality metrics, and best practice recommendations. A written personalized  care plan for preventive services as well as general preventive health recommendations were provided to patient.  Due to this being a telephonic visit, the after visit summary with patients personalized plan was offered to patient via mail or my-chart.  Per request, patient was mailed a copy of AVS.  Signed,    Marta Antu, LPN   [id number]  Nurse Health Advisor    Nurse Notes: None

## 2019-08-20 LAB — HM DIABETES EYE EXAM

## 2019-09-01 ENCOUNTER — Telehealth: Payer: Self-pay | Admitting: Family Medicine

## 2019-09-01 NOTE — Telephone Encounter (Signed)
Patient states that [facility] is unable to process her prescriptions because they need more information. They told her that they tried multiple times to contact provider, but no success so they asked her to call the office and have her provider contact [facility] at [phone].   Order# [id number]

## 2019-09-02 NOTE — Telephone Encounter (Signed)
Spoke with patient and advised her that lancet order had been clarified. Patient denies any other concerns with her prescriptions. Closing encounter.

## 2019-09-02 NOTE — Telephone Encounter (Signed)
Per Amy, pharmacist, clarification needed regarding lancet order. Per PCP, patient should use lancets 3 times daily. Pharmacist denied any concerns with other prescriptions.

## 2019-09-14 ENCOUNTER — Encounter: Payer: Self-pay | Admitting: Family Medicine

## 2019-10-21 ENCOUNTER — Encounter: Payer: Self-pay | Admitting: Family Medicine

## 2019-11-03 ENCOUNTER — Other Ambulatory Visit: Payer: Self-pay | Admitting: Family Medicine

## 2019-11-11 ENCOUNTER — Ambulatory Visit: Payer: Medicare Other | Admitting: Family Medicine

## 2019-11-19 ENCOUNTER — Other Ambulatory Visit: Payer: Self-pay

## 2019-11-20 ENCOUNTER — Ambulatory Visit (INDEPENDENT_AMBULATORY_CARE_PROVIDER_SITE_OTHER): Payer: Medicare Other | Admitting: Family Medicine

## 2019-11-20 ENCOUNTER — Encounter: Payer: Self-pay | Admitting: Family Medicine

## 2019-11-20 VITALS — BP 136/86 | HR 89 | Temp 97.4°F | Ht 63.0 in | Wt 143.8 lb

## 2019-11-20 DIAGNOSIS — I10 Essential (primary) hypertension: Secondary | ICD-10-CM

## 2019-11-20 DIAGNOSIS — E1129 Type 2 diabetes mellitus with other diabetic kidney complication: Secondary | ICD-10-CM | POA: Diagnosis not present

## 2019-11-20 DIAGNOSIS — I700 Atherosclerosis of aorta: Secondary | ICD-10-CM | POA: Diagnosis not present

## 2019-11-20 DIAGNOSIS — E1165 Type 2 diabetes mellitus with hyperglycemia: Secondary | ICD-10-CM

## 2019-11-20 DIAGNOSIS — N184 Chronic kidney disease, stage 4 (severe): Secondary | ICD-10-CM

## 2019-11-20 DIAGNOSIS — E7800 Pure hypercholesterolemia, unspecified: Secondary | ICD-10-CM

## 2019-11-20 DIAGNOSIS — E1122 Type 2 diabetes mellitus with diabetic chronic kidney disease: Secondary | ICD-10-CM

## 2019-11-20 DIAGNOSIS — IMO0002 Reserved for concepts with insufficient information to code with codable children: Secondary | ICD-10-CM

## 2019-11-20 LAB — BASIC METABOLIC PANEL
BUN: 21 mg/dL (ref 6–23)
CO2: 25 mEq/L (ref 19–32)
Calcium: 8.8 mg/dL (ref 8.4–10.5)
Chloride: 103 mEq/L (ref 96–112)
Creatinine, Ser: 1.59 mg/dL — ABNORMAL HIGH (ref 0.40–1.20)
GFR: 31.26 mL/min — ABNORMAL LOW (ref >60.00)
Glucose, Bld: 145 mg/dL — ABNORMAL HIGH (ref 70–99)
Potassium: 4.3 mEq/L (ref 3.5–5.1)
Sodium: 138 mEq/L (ref 135–145)

## 2019-11-20 LAB — MICROALBUMIN / CREATININE URINE RATIO
Creatinine,U: 101.3 mg/dL
Microalb Creat Ratio: 25.6 mg/g (ref 0.0–30.0)
Microalb, Ur: 25.9 mg/dL — ABNORMAL HIGH (ref 0.0–1.9)

## 2019-11-20 LAB — HEMOGLOBIN A1C: Hgb A1c MFr Bld: 9.1 % — ABNORMAL HIGH (ref 4.6–6.5)

## 2019-11-20 NOTE — Patient Instructions (Addendum)
Try miralax 1/2 to 1 capful in 4-6oz of water daily to every other day Goal water intake 48-64oz per day! Add fiber supplement (benefiber, metamucil)

## 2019-11-20 NOTE — Progress Notes (Signed)
Chief Complaint  Patient presents with  . Follow-up    3 month f/u; needs Aortic Atherosclerosis (04/29/2017) documented,  c/o that she could have an ulcer.      HPI: *[name] is a 79 y.o. female here for DM, HTN, HLD follow-up. For her DM, pt is taking metformin 554m daily, glipizide 148mdaily, januiva 10468EHaily, trulicity 1.2.1YYeekly. For HTN pt is taking losartan 5039maily and chlorthalidone 73m75mily PRN. Pt is not able to tolerate statin - joint and muscle pains. She is taking omega 3 fish oil 1000mg75m  Pt does infrequently check BS at home. Readings: 117 up to 170-180. Hypoglycemia/Hypergylcemic episodes: no Eye exam - UTD  Diet: pt states she eats a lot of veggies but also a lot of pasta Exercise: limited d/t circulatory issues, does work in the yard and has a garden  Lab RMaterials engineere Date   HGBA1C 9.3 (H) 08/05/2019   Lab Results  Component Value Date   MICROALBUR 264 02/20/2018   Lab Results  Component Value Date   CREATININE 1.70 (H) 07/01/2019   Lab Results  Component Value Date   CHOL 189 06/24/2019   HDL 29.60 (L) 06/24/2019   LDLCALC 83 05/13/2008   LDLDIRECT 101.0 06/24/2019   TRIG 292.0 (H) 06/24/2019   CHOLHDL 6 06/24/2019    The 10-year ASCVD risk score (GoffMikey Bussingr., et al., 2013) is: 45.6%   Values used to calculate the score:     Age: 64 ye40s     Sex: Female     Is Non-Hispanic African American: No     Diabetic: Yes     Tobacco smoker: No     Systolic Blood Pressure: 136 m482     Is BP treated: Yes     HDL Cholesterol: 29.6 mg/dL     Total Cholesterol: 189 mg/dL  Lab Results  Component Value Date   TSH 2.44 06/23/2019   Last vitamin D Lab Results  Component Value Date   VD25OH 55.05 11/03/2018   Pt has documented aortic atherosclerosis, PAD. She follows with cardio and vascular and has abdominal aortogram in 06/2019 w/ stent placement x2.   Past Medical History:  Diagnosis Date  . Allergy   . Anemia     childhood  . Anxiety   . Arterial stenosis (HCC)    mesenteric  . Arthritis   . Breast cancer (HCC) [place]13/12   R breast, inv mammary, in situ,ER/PR +,HER2 -  . Cancer (HCC) [facility] Carcinoma of breast treated with adjuvant chemotherapy (HCC) [place] Coronary artery disease    stent 2007  . Depression   . Diabetes mellitus   . Difficulty sleeping   . Diverticulosis 12/10/03  . Elevated cholesterol   . Generalized weakness   . GERD (gastroesophageal reflux disease)   . Heart murmur    secondary to chem. 2013  . Hepatitis    as an infant  . HSV-1 (herpes simplex virus 1) infection   . Hyperlipidemia   . Hypertension   . Hypothyroidism   . Osteoarthritis   . Personal history of chemotherapy   . Personal history of radiation therapy   . Pneumonia    2014 september  . 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 Thyroid disease     Past Surgical History:  Procedure Laterality Date  . ABDOMINAL AORTOGRAM W/LOWER EXTREMITY Bilateral 04/29/2019  Procedure: ABDOMINAL AORTOGRAM W/LOWER EXTREMITY;  Surgeon: Marty Heck, MD;  Location: [facility];  Service: Cardiovascular;  Laterality: Bilateral;  . ABDOMINAL AORTOGRAM W/LOWER EXTREMITY Left 07/01/2019   Procedure: ABDOMINAL AORTOGRAM W/ Left LOWER EXTREMITY [place];  Surgeon: Marty Heck, MD;  Location: [facility];  Service: Cardiovascular;  Laterality: Left;  . ANTERIOR AND POSTERIOR REPAIR N/A 04/05/2016   Procedure: ANTERIOR (CYSTOCELE) AND POSTERIOR REPAIR (RECTOCELE);  Surgeon: [name], MD;  Location: [facility];  Service: Gynecology;  Laterality: N/A;  . BREAST BIOPSY    . BREAST LUMPECTOMY  1983   benign biopsy  . BREAST LUMPECTOMY Right 12/29/2010   snbx, ER/PR +, Her2 -, 0/1 node pos.  . CAROTID STENT  2007  . CARPAL TUNNEL RELEASE Bilateral 9/12,8,12  . CHOLECYSTECTOMY    . COLONOSCOPY N/A 02/09/2013   Procedure: COLONOSCOPY;  Surgeon: [name], MD;   Location: [facility];  Service: Endoscopy;  Laterality: N/A;  . COLONOSCOPY    . CORONARY ANGIOPLASTY    . DILATION AND CURETTAGE OF UTERUS    . ESOPHAGOGASTRODUODENOSCOPY N/A 02/09/2013   Procedure: ESOPHAGOGASTRODUODENOSCOPY (EGD);  Surgeon: [name], MD;  Location: [name];  Service: Endoscopy;  Laterality: N/A;  . FOOT SPURS    . HYSTEROSCOPY WITH D & C N/A 09/11/2012   Procedure: DILATATION AND CURETTAGE ;  Surgeon: [name], MD;  Location: [facility];  Service: Gynecology;  Laterality: N/A;  . KNEE SURGERY  2007  . LAPAROSCOPIC ASSISTED VAGINAL HYSTERECTOMY N/A 04/05/2016   Procedure: LAPAROSCOPIC ASSISTED VAGINAL HYSTERECTOMY possible BSO;  Surgeon: [name], MD;  Location: [facility];  Service: Gynecology;  Laterality: N/A;  . LUMBAR LAMINECTOMY/DECOMPRESSION MICRODISCECTOMY N/A 10/20/2014   Procedure: MICRO LUMBER DECOMPRESSION L3-4 L4-5;  Surgeon: [name], MD;  Location: [facility];  Service: Orthopedics;  Laterality: N/A;  . LUMBAR LAMINECTOMY/DECOMPRESSION MICRODISCECTOMY Bilateral 10/13/2015   Procedure: MICRO LUMBAR DECOMPRESSION L5 - S1 AND REDO DECOMPRESSION L4 - L5 AND REMOVAL OF FACET CYST L4 - L5 2 LEVELS;  Surgeon: [name], MD;  Location: [facility];  Service: Orthopedics;  Laterality: Bilateral;  . PERIPHERAL VASCULAR INTERVENTION Right 04/29/2019   Procedure: PERIPHERAL VASCULAR INTERVENTION;  Surgeon: Marty Heck, MD;  Location: [facility];  Service: Cardiovascular;  Laterality: Right;  . PORT-A-CATH REMOVAL  04/17/2011   Procedure: REMOVAL PORT-A-CATH;  Surgeon: [name], MD;  Location: [facility];  Service: General;  Laterality: Left;  . PORTACATH PLACEMENT  02/07/2011   Procedure: INSERTION PORT-A-CATH;  Surgeon: [name], MD;  Location: [facility];  Service: General;  Laterality: N/A;  . UPPER GASTROINTESTINAL ENDOSCOPY      Social History   Socioeconomic History  . Marital status: Married    Spouse  name: Not on file  . Number of children: 2  . Years of education: Not on file  . Highest education level: Not on file  Occupational History  . Occupation: retired  Tobacco Use  . Smoking status: Former Smoker    Packs/day: 2.00    Years: 40.00    Pack years: 80.00    Quit date: 12/19/1997    Years since quitting: 21.9  . Smokeless tobacco: Never Used  Vaping Use  . Vaping Use: Never used  Substance and Sexual Activity  . Alcohol use: Yes    Alcohol/week: 6.0 standard drinks    Types: 6 Glasses of wine per week    Comment: occasional  . Drug use: No  .  Sexual activity: Not on file    Comment: menarch age 47, P8, HRT X 10 YRS, MENOPAUSE MID 40'S  Other Topics Concern  . Not on file  Social History Narrative   [name] age 65- [place] age 54  [place]   Social Determinants of Health   Financial Resource Strain: Low Risk   . Difficulty of Paying Living Expenses: Not hard at all  Food Insecurity: No Food Insecurity  . Worried About Charity fundraiser in the Last Year: Never true  . Ran Out of Food in the Last Year: Never true  Transportation Needs: No Transportation Needs  . Lack of Transportation (Medical): No  . Lack of Transportation (Non-Medical): No  Physical Activity: Inactive  . Days of Exercise per Week: 0 days  . Minutes of Exercise per Session: 0 min  Stress: No Stress Concern Present  . Feeling of Stress : Not at all  Social Connections: Socially Integrated  . Frequency of Communication with Friends and Family: More than three times a week  . Frequency of Social Gatherings with Friends and Family: More than three times a week  . Attends Religious Services: More than 4 times per year  . Active Member of Clubs or Organizations: Yes  . Attends Archivist Meetings: More than 4 times per year  . Marital Status: Married  Human resources officer Violence: Not At Risk  . Fear of Current or Ex-Partner: No  . Emotionally Abused: No  . Physically  Abused: No  . Sexually Abused: No    Family History  Problem Relation Age of Onset  . Throat cancer Father   . Alcoholism Father   . Heart attack Father   . Heart disease Mother   . Lymphoma Brother 33  . Cancer Son   . Heart attack Brother   . Diabetes Brother   . Hypertension Brother   . Colon cancer Neg Hx   . Stroke Neg Hx   . Esophageal cancer Neg Hx   . Rectal cancer Neg Hx   . Stomach cancer Neg Hx      Immunization History  Administered Date(s) Administered  . Fluad Quad(high Dose 65+) 11/20/2018  . Influenza Split 12/07/2014  . Influenza, High Dose Seasonal PF 11/26/2016  . Influenza,inj,quad, With Preservative 02/22/2017, 11/20/2018  . Influenza-Unspecified 10/31/2009, 12/01/2010, 12/03/2011, 11/05/2012, 11/05/2013, 10/25/2014, 11/10/2015, 11/27/2017, 10/07/2019  . PFIZER SARS-COV-2 Vaccination 02/12/2019, 03/05/2019  . Pneumococcal Conjugate-13 07/30/2014  . Pneumococcal Polysaccharide-23 11/19/2007, 11/15/2008  . Pneumococcal-Unspecified 01/23/2010  . Zoster Recombinat (Shingrix) 02/01/2011    Outpatient Encounter Medications as of 11/20/2019  Medication Sig Note  . Accu-Chek FastClix Lancets MISC USE TO CHECK BLOOD SUGARS 3 TIMES DAILY   . acetaminophen (TYLENOL) 500 MG tablet Take 1,000 mg by mouth every 6 (six) hours as needed for moderate pain or headache.   Marland Kitchen aspirin EC 81 MG tablet Take 81 mg by mouth at bedtime.   . calcium carbonate (TUMS - DOSED IN MG ELEMENTAL CALCIUM) 500 MG chewable tablet Chew 2 tablets by mouth 3 (three) times daily as needed for indigestion or heartburn.   . cholecalciferol (VITAMIN D) 25 MCG (1000 UNIT) tablet Take 1,000 Units by mouth daily.   . clobetasol cream (TEMOVATE) 7.26 % Apply 1 application topically 2 (two) times daily. (Patient taking differently: Apply 1 application topically 2 (two) times daily as needed (itching). )   . Dulaglutide (TRULICITY) 1.5 OM/3.5DH SOPN Inject 0.5 mLs (1.5 mg total) into the skin once a  week.   . ferrous sulfate (FERROUSUL) 325 (65 FE) MG tablet Take 1 tablet (325 mg total) by mouth daily with breakfast. (Patient taking differently: Take 325 mg by mouth every other day. )   . glipiZIDE (GLUCOTROL XL) 10 MG 24 hr tablet Take 1 tablet (10 mg total) by mouth daily with breakfast. (Patient taking differently: Take 10 mg by mouth daily at 12 noon. )   . glucose blood (ACCU-CHEK GUIDE) test strip TEST ONCE DAILY   . JANUVIA 100 MG tablet TAKE 1 TABLET BY MOUTH  DAILY (Patient taking differently: Take 100 mg by mouth at bedtime. )   . Lancet Device MISC 100 each by Does not apply route daily. UAD for daily glucose monitoring   . Lancets Misc. (ACCU-CHEK MULTICLIX LANCET DEV) KIT Use as directed to check sugars   . leflunomide (ARAVA) 20 MG tablet Take 20 mg by mouth daily.   Marland Kitchen levothyroxine (SYNTHROID) 88 MCG tablet Take 1 tablet (88 mcg total) by mouth daily.   Marland Kitchen losartan (COZAAR) 50 MG tablet TAKE 1 TABLET BY MOUTH  DAILY (Patient taking differently: Take 50 mg by mouth daily. )   . metFORMIN (GLUCOPHAGE) 500 MG tablet Take 1 tablet (500 mg total) by mouth daily with breakfast.   . nitroGLYCERIN (NITROSTAT) 0.4 MG SL tablet Place 1 tablet (0.4 mg total) under the tongue every 5 (five) minutes as needed for chest pain.   . Omega-3 Fatty Acids (FISH OIL) 1000 MG CAPS Take 1,000 mg by mouth in the morning and at bedtime.   . pantoprazole (PROTONIX) 40 MG tablet Take 1 tablet (40 mg total) by mouth daily.   Marland Kitchen PARoxetine (PAXIL) 20 MG tablet TAKE 1 TABLET BY MOUTH EVERY DAY IN THE MORNING   . valACYclovir (VALTREX) 1000 MG tablet TAKE 1 TABLET BY MOUTH 2  TIMES DAILY AS NEEDED FOR  VIRAL INFECTION. (Patient taking differently: Take 1,000 mg by mouth 2 (two) times daily as needed (fever blisters). )   . vitamin B-12 (CYANOCOBALAMIN) 1000 MCG tablet Take 1,000 mcg by mouth daily.   . chlorthalidone (HYGROTON) 25 MG tablet TAKE 1/2 TABLET(12.5 MG) BY MOUTH EVERY DAY FOR 3 DAYS THEN TAKE 1  TABLET(25 MG) BY MOUTH EVERY DAY (Patient not taking: Reported on 11/20/2019) 04/22/2019: Pt can tell when she starts getting a headache that's related to her high bp  . Golimumab ([place] ARIA IV) Inject 1 Dose into the vein See admin instructions. Every 2 months. (Patient not taking: Reported on 11/20/2019)    No facility-administered encounter medications on file as of 11/20/2019.     ROS: Pertinent positives and negatives noted in HPI. Remainder of ROS non-contributory    Allergies  Allergen Reactions  . Codeine Nausea And Vomiting and Other (See Comments)    Severe stomach cramps  . Antihistamines, Diphenhydramine-Type Other (See Comments)    Causes hyperactivity  . Gabapentin     Urinary incontinence  . Statins Other (See Comments)    Joint pains  . Sulfa Antibiotics     Unknown to pt  . Erythromycin Hives and Rash    Due to dental work about 50 years ago. Was used in packing and resulted in rash/hives inside and outside of mouth.    BP 136/86   Pulse 89   Temp (!) 97.4 F (36.3 C) (Temporal)   Ht '5\' 3"'  (1.6 m)   Wt 143 lb 12.8 oz (65.2 kg)   SpO2 99%   BMI 25.47 kg/m  BP Readings from Last 3 Encounters:  11/20/19 136/86  08/05/19 120/70  08/04/19 (!) 194/90   Pulse Readings from Last 3 Encounters:  11/20/19 89  08/05/19 95  08/04/19 83   Wt Readings from Last 3 Encounters:  11/20/19 143 lb 12.8 oz (65.2 kg)  08/19/19 142 lb (64.4 kg)  08/05/19 145 lb 6.4 oz (66 kg)    Physical Exam Constitutional:      General: She is not in acute distress.    Appearance: Normal appearance. She is not ill-appearing.  Cardiovascular:     Rate and Rhythm: Normal rate and regular rhythm.  Pulmonary:     Effort: Pulmonary effort is normal. No respiratory distress.     Breath sounds: Normal breath sounds. No wheezing or rhonchi.  Musculoskeletal:     Right lower leg: No edema.     Left lower leg: No edema.  Neurological:     Mental Status: She is alert and  oriented to person, place, and time.  Psychiatric:        Mood and Affect: Mood normal.        Behavior: Behavior normal.      A/P: 1. Type 2 diabetes, uncontrolled, with renal manifestation (HCC) - on metformin 526m daily, glipizide 171mdaily, januiva 10902XJaily, trulicity 1.1.5ZMeekly - home BS average 170 as per pt, this AM 117 - stressed importance of low carb diet - pt intolerant of statin, on ARB, ASA - Microalbumin / creatinine urine ratio - Hemoglobin A1c - will contact pt with results when available and adjust meds accordingly  2. Essential hypertension - controlled, at goal - cont losartan 5030maily - Basic metabolic panel  3. Pure hypercholesterolemia - intolerant of statins and declines trial of every other day or similar - LDL at goal, TG elevated in 06/2019 - cont fish oil, consider fenofibrate but pt decliens  4. Aortic atherosclerosis ([place], vascular  5. 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[place] Basic metabolic panel    This visit occurred during the SARS-CoV-2 public health emergency.  Safety protocols were in place, including screening questions prior to the visit, additional usage of staff PPE, and extensive cleaning of exam room while observing appropriate contact time as indicated for disinfecting solutions.

## 2019-11-26 ENCOUNTER — Telehealth: Payer: Self-pay

## 2019-11-26 DIAGNOSIS — E1165 Type 2 diabetes mellitus with hyperglycemia: Secondary | ICD-10-CM

## 2019-11-26 DIAGNOSIS — E1129 Type 2 diabetes mellitus with other diabetic kidney complication: Secondary | ICD-10-CM

## 2019-11-26 DIAGNOSIS — IMO0002 Reserved for concepts with insufficient information to code with codable children: Secondary | ICD-10-CM

## 2019-11-26 NOTE — Telephone Encounter (Signed)
Pt calling for lab results.  Please advise. CB [phone]

## 2019-11-27 MED ORDER — TRULICITY 1.5 MG/0.5ML ~~LOC~~ SOAJ: 3.0000 mg | SUBCUTANEOUS | 3 refills | Status: DC

## 2019-11-27 NOTE — Telephone Encounter (Signed)
Dr Loletha Grayer,  Spoke to patient and she would like to know her lab results from 11/19/19.  Please review and advise.  Thanks.   Dm/cma

## 2019-11-27 NOTE — Telephone Encounter (Signed)
Patient notified VIA phone and advised on medication change. Pharmacy called to advise as well.   No further questions.  Dm/cma

## 2019-11-27 NOTE — Telephone Encounter (Signed)
Her A1C improved slightly from 9.3 to 9.1. I would like pt to increase her trulicity from 1.5mg  to 3mg  weekly.  Electrolytes are normal and kidney function (creatinine and GFR) are slightly improved compared to 4 mo ago.

## 2019-11-30 ENCOUNTER — Telehealth: Payer: Self-pay

## 2019-11-30 NOTE — Telephone Encounter (Signed)
Pt calling to schedule an appointment w/Dr. C for Wednesday, due to pt thinking she has a kidney stone.  Pleas advise.

## 2019-12-01 ENCOUNTER — Other Ambulatory Visit: Payer: Self-pay

## 2019-12-02 ENCOUNTER — Ambulatory Visit (INDEPENDENT_AMBULATORY_CARE_PROVIDER_SITE_OTHER): Payer: Medicare Other | Admitting: Nurse Practitioner

## 2019-12-02 ENCOUNTER — Encounter: Payer: Self-pay | Admitting: Nurse Practitioner

## 2019-12-02 ENCOUNTER — Telehealth: Payer: Self-pay | Admitting: Nurse Practitioner

## 2019-12-02 ENCOUNTER — Ambulatory Visit: Payer: Medicare Other | Admitting: Family Medicine

## 2019-12-02 VITALS — BP 130/70 | HR 95 | Temp 96.7°F | Ht 63.0 in | Wt 142.6 lb

## 2019-12-02 DIAGNOSIS — R8290 Unspecified abnormal findings in urine: Secondary | ICD-10-CM

## 2019-12-02 DIAGNOSIS — N3000 Acute cystitis without hematuria: Secondary | ICD-10-CM

## 2019-12-02 DIAGNOSIS — M545 Low back pain, unspecified: Secondary | ICD-10-CM | POA: Diagnosis not present

## 2019-12-02 LAB — POCT URINALYSIS DIPSTICK
Bilirubin, UA: NEGATIVE
Blood, UA: NEGATIVE
Glucose, UA: NEGATIVE
Ketones, UA: NEGATIVE
Nitrite, UA: POSITIVE
Protein, UA: POSITIVE — AB
Spec Grav, UA: 1.025 (ref 1.010–1.025)
Urobilinogen, UA: 0.2 E.U./dL
pH, UA: 6 (ref 5.0–8.0)

## 2019-12-02 MED ORDER — FOSFOMYCIN TROMETHAMINE 3 G PO PACK: 3.0000 g | PACK | Freq: Once | ORAL | 0 refills | Status: AC

## 2019-12-02 NOTE — Telephone Encounter (Signed)
error 

## 2019-12-02 NOTE — Progress Notes (Signed)
Subjective:  Patient ID: [name], female    DOB: [birth year]  Age: 79 y.o. MRN: [id number]  CC: Acute Visit (Pt states she thinks she has a kidney stone, right side back pain that travels into the abdomen area. Pt states she has had some pressure and urinary frequency. Denies burning, itching or pain when urinating. )  Back Pain This is a new problem. The current episode started 1 to 4 weeks ago. The problem occurs intermittently. The problem has been waxing and waning since onset. The pain is present in the lumbar spine. The quality of the pain is described as aching. Radiates to: RLQ of abdomen. The pain is the same all the time. Exacerbated by: nothing. Associated symptoms include abdominal pain. Pertinent negatives include no bladder incontinence, bowel incontinence, dysuria, leg pain, numbness, paresis, pelvic pain, perianal numbness, tingling or weakness. (No constipation or diarrhea, no melena or hematochzia, no hematuria) Risk factors include menopause, history of cancer and lack of exercise. She has tried nothing for the symptoms.  hx lumbar spine stenosis.  Reviewed past Medical, Social and Family history today.  Outpatient Medications Prior to Visit  Medication Sig Dispense Refill  . Accu-Chek FastClix Lancets MISC USE TO CHECK BLOOD SUGARS 3 TIMES DAILY 306 each 3  . acetaminophen (TYLENOL) 500 MG tablet Take 1,000 mg by mouth every 6 (six) hours as needed for moderate pain or headache.    Marland Kitchen aspirin EC 81 MG tablet Take 81 mg by mouth at bedtime.    . calcium carbonate (TUMS - DOSED IN MG ELEMENTAL CALCIUM) 500 MG chewable tablet Chew 2 tablets by mouth 3 (three) times daily as needed for indigestion or heartburn.    . cholecalciferol (VITAMIN D) 25 MCG (1000 UNIT) tablet Take 1,000 Units by mouth daily.    . clobetasol cream (TEMOVATE) 7.41 % Apply 1 application topically 2 (two) times daily. (Patient taking differently: Apply 1 application topically 2 (two) times daily as  needed (itching). ) 30 g 0  . Dulaglutide (TRULICITY) 1.5 OI/7.8MV SOPN Inject 3 mg into the skin once a week. 12 mL 3  . ferrous sulfate (FERROUSUL) 325 (65 FE) MG tablet Take 1 tablet (325 mg total) by mouth daily with breakfast. (Patient taking differently: Take 325 mg by mouth every other day. ) 30 tablet 3  . glipiZIDE (GLUCOTROL XL) 10 MG 24 hr tablet Take 1 tablet (10 mg total) by mouth daily with breakfast. (Patient taking differently: Take 10 mg by mouth daily at 12 noon. ) 90 tablet 3  . glucose blood (ACCU-CHEK GUIDE) test strip TEST ONCE DAILY 100 each 5  . Lancet Device MISC 100 each by Does not apply route daily. UAD for daily glucose monitoring 100 each PRN  . Lancets Misc. (ACCU-CHEK MULTICLIX LANCET DEV) KIT Use as directed to check sugars    . leflunomide (ARAVA) 20 MG tablet Take 20 mg by mouth daily.    Marland Kitchen levothyroxine (SYNTHROID) 88 MCG tablet Take 1 tablet (88 mcg total) by mouth daily. 90 tablet 3  . losartan (COZAAR) 50 MG tablet TAKE 1 TABLET BY MOUTH  DAILY (Patient taking differently: Take 50 mg by mouth daily. ) 90 tablet 3  . metFORMIN (GLUCOPHAGE) 500 MG tablet Take 1 tablet (500 mg total) by mouth daily with breakfast. 90 tablet 3  . nitroGLYCERIN (NITROSTAT) 0.4 MG SL tablet Place 1 tablet (0.4 mg total) under the tongue every 5 (five) minutes as needed for chest pain. 25 tablet 3  .  Omega-3 Fatty Acids (FISH OIL) 1000 MG CAPS Take 1,000 mg by mouth in the morning and at bedtime.    Marland Kitchen PARoxetine (PAXIL) 20 MG tablet TAKE 1 TABLET BY MOUTH EVERY DAY IN THE MORNING 90 tablet 1  . valACYclovir (VALTREX) 1000 MG tablet TAKE 1 TABLET BY MOUTH 2  TIMES DAILY AS NEEDED FOR  VIRAL INFECTION. (Patient taking differently: Take 1,000 mg by mouth 2 (two) times daily as needed (fever blisters). ) 180 tablet 0  . vitamin B-12 (CYANOCOBALAMIN) 1000 MCG tablet Take 1,000 mcg by mouth daily.    . chlorthalidone (HYGROTON) 25 MG tablet TAKE 1/2 TABLET(12.5 MG) BY MOUTH EVERY DAY FOR 3  DAYS THEN TAKE 1 TABLET(25 MG) BY MOUTH EVERY DAY (Patient not taking: Reported on 11/20/2019) 90 tablet 0  . Golimumab ([place] ARIA IV) Inject 1 Dose into the vein See admin instructions. Every 2 months. (Patient not taking: Reported on 11/20/2019)    . pantoprazole (PROTONIX) 40 MG tablet Take 1 tablet (40 mg total) by mouth daily. (Patient not taking: Reported on 12/02/2019) 90 tablet 1   No facility-administered medications prior to visit.    ROS See HPI  Objective:  BP 130/70 (BP Location: Left Arm, Patient Position: Sitting, Cuff Size: Normal)   Pulse 95   Temp (!) 96.7 F (35.9 C) (Temporal)   Ht 5' 3" (1.6 m)   Wt 142 lb 9.6 oz (64.7 kg)   SpO2 95%   BMI 25.26 kg/m   Physical Exam Constitutional:      General: She is not in acute distress. Cardiovascular:     Rate and Rhythm: Normal rate.     Pulses: Normal pulses.  Pulmonary:     Effort: Pulmonary effort is normal.     Breath sounds: Normal breath sounds.  Abdominal:     General: Bowel sounds are normal. There is no distension.     Palpations: Abdomen is soft. There is no mass.     Tenderness: There is no abdominal tenderness. There is no right CVA tenderness, left CVA tenderness, guarding or rebound.  Skin:    General: Skin is warm and dry.     Findings: No erythema or rash.  Neurological:     Mental Status: She is alert and oriented to person, place, and time.    Assessment & Plan:  This visit occurred during the SARS-CoV-2 public health emergency.  Safety protocols were in place, including screening questions prior to the visit, additional usage of staff PPE, and extensive cleaning of exam room while observing appropriate contact time as indicated for disinfecting solutions.   [name] was seen today for acute visit.  Diagnoses and all orders for this visit:  Acute right-sided low back pain without sciatica -     fosfomycin (MONUROL) 3 g PACK; Take 3 g by mouth once for 1 dose.  Abnormal finding on  urinalysis -     POCT urinalysis dipstick -     fosfomycin (MONUROL) 3 g PACK; Take 3 g by mouth once for 1 dose.  positive leukocytes and nitrites Possible musculoskeletal vs acute cystitis. Low risk for nephrolithiasis due to negative flank pain and hematuria. Fosfomycin sent. Pending urine culture. Unable to use macrobid and cipro due to renal function.  Problem List Items Addressed This Visit    None    Visit Diagnoses    Acute right-sided low back pain without sciatica    -  Primary   Relevant Medications   fosfomycin (MONUROL) 3 g  PACK   Abnormal finding on urinalysis       Relevant Medications   fosfomycin (MONUROL) 3 g PACK   Other Relevant Orders   POCT urinalysis dipstick (Completed)      Follow-up: No follow-ups on file.  Wilfred Lacy, NP

## 2019-12-02 NOTE — Patient Instructions (Addendum)
urine sent for culture You will be contacted with results. Maintain adequate oral hydration. Use tylenol 650mg  every 12hrs and warm compress as needed for pain.  Acute Back Pain, Adult Acute back pain is sudden and usually short-lived. It is often caused by an injury to the muscles and tissues in the back. The injury may result from:  A muscle or ligament getting overstretched or torn (strained). Ligaments are tissues that connect bones to each other. Lifting something improperly can cause a back strain.  Wear and tear (degeneration) of the spinal disks. Spinal disks are circular tissue that provides cushioning between the bones of the spine (vertebrae).  Twisting motions, such as while playing sports or doing yard work.  A hit to the back.  Arthritis. You may have a physical exam, lab tests, and imaging tests to find the cause of your pain. Acute back pain usually goes away with rest and home care. Follow these instructions at home: Managing pain, stiffness, and swelling  Take over-the-counter and prescription medicines only as told by your health care provider.  Your health care provider may recommend applying ice during the first 24-48 hours after your pain starts. To do this: ? Put ice in a plastic bag. ? Place a towel between your skin and the bag. ? Leave the ice on for 20 minutes, 2-3 times a day.  If directed, apply heat to the affected area as often as told by your health care provider. Use the heat source that your health care provider recommends, such as a moist heat pack or a heating pad. ? Place a towel between your skin and the heat source. ? Leave the heat on for 20-30 minutes. ? Remove the heat if your skin turns bright red. This is especially important if you are unable to feel pain, heat, or cold. You have a greater risk of getting burned. Activity   Do not stay in bed. Staying in bed for more than 1-2 days can delay your recovery.  Sit up and stand up straight.  Avoid leaning forward when you sit, or hunching over when you stand. ? If you work at a desk, sit close to it so you do not need to lean over. Keep your chin tucked in. Keep your neck drawn back, and keep your elbows bent at a right angle. Your arms should look like the letter "L." ? Sit high and close to the steering wheel when you drive. Add lower back (lumbar) support to your car seat, if needed.  Take short walks on even surfaces as soon as you are able. Try to increase the length of time you walk each day.  Do not sit, drive, or stand in one place for more than 30 minutes at a time. Sitting or standing for long periods of time can put stress on your back.  Do not drive or use heavy machinery while taking prescription pain medicine.  Use proper lifting techniques. When you bend and lift, use positions that put less stress on your back: ? [place] your knees. ? Keep the load close to your body. ? Avoid twisting.  Exercise regularly as told by your health care provider. Exercising helps your back heal faster and helps prevent back injuries by keeping muscles strong and flexible.  Work with a physical therapist to make a safe exercise program, as recommended by your health care provider. Do any exercises as told by your physical therapist. Lifestyle  Maintain a healthy weight. Extra weight puts stress on  your back and makes it difficult to have good posture.  Avoid activities or situations that make you feel anxious or stressed. Stress and anxiety increase muscle tension and can make back pain worse. Learn ways to manage anxiety and stress, such as through exercise. General instructions  Sleep on a firm mattress in a comfortable position. Try lying on your side with your knees slightly bent. If you lie on your back, put a pillow under your knees.  Follow your treatment plan as told by your health care provider. This may include: ? Cognitive or behavioral therapy. ? Acupuncture or massage  therapy. ? Meditation or yoga. Contact a health care provider if:  You have pain that is not relieved with rest or medicine.  You have increasing pain going down into your legs or buttocks.  Your pain does not improve after 2 weeks.  You have pain at night.  You lose weight without trying.  You have a fever or chills. Get help right away if:  You develop new bowel or bladder control problems.  You have unusual weakness or numbness in your arms or legs.  You develop nausea or vomiting.  You develop abdominal pain.  You feel faint. Summary  Acute back pain is sudden and usually short-lived.  Use proper lifting techniques. When you bend and lift, use positions that put less stress on your back.  Take over-the-counter and prescription medicines and apply heat or ice as directed by your health care provider. This information is not intended to replace advice given to you by your health care provider. Make sure you discuss any questions you have with your health care provider. Document Revised: 04/29/2018 Document Reviewed: 08/22/2016 Elsevier Patient Education  [place].

## 2019-12-05 LAB — URINE CULTURE
MICRO NUMBER:: 11185893
SPECIMEN QUALITY:: ADEQUATE

## 2019-12-07 MED ORDER — NITROFURANTOIN MONOHYD MACRO 100 MG PO CAPS: 100.0000 mg | ORAL_CAPSULE | Freq: Two times a day (BID) | ORAL | 0 refills | Status: AC

## 2019-12-07 NOTE — Addendum Note (Signed)
Addended by: Leana Gamer on: 12/07/2019 03:27 PM   Modules accepted: Orders

## 2020-01-12 ENCOUNTER — Telehealth: Payer: Self-pay

## 2020-01-12 DIAGNOSIS — E1129 Type 2 diabetes mellitus with other diabetic kidney complication: Secondary | ICD-10-CM

## 2020-01-12 DIAGNOSIS — IMO0002 Reserved for concepts with insufficient information to code with codable children: Secondary | ICD-10-CM

## 2020-01-12 NOTE — Telephone Encounter (Signed)
Pt calling in regards of her Trulicity, she thinks that she is allergic to it, she said that she itch worse when she takes her dose.  Pt would like to know if she can be put on something else.  Please advise.

## 2020-01-13 NOTE — Telephone Encounter (Signed)
Pt has been on trulicity since 02/6376. Has she experienced this itching since that time? Has long has this been happening? Does it only occur in the day or two after her trulicity injection then resolve? Anything new or different since symptoms started like new laundry detergent, soap, lotion, pet, etc?

## 2020-01-14 MED ORDER — PIOGLITAZONE HCL 30 MG PO TABS: 30.0000 mg | ORAL_TABLET | Freq: Every day | ORAL | 1 refills | Status: DC

## 2020-01-14 MED ORDER — SITAGLIPTIN PHOSPHATE 100 MG PO TABS: 100.0000 mg | ORAL_TABLET | Freq: Every day | ORAL | 3 refills | Status: DC

## 2020-01-14 NOTE — Addendum Note (Signed)
Addended by: Ronnald Nian on: 01/14/2020 02:14 PM   Modules accepted: Orders

## 2020-01-14 NOTE — Telephone Encounter (Signed)
Lab Results  Component Value Date   HGBA1C 9.1 (H) 32/95/1884   Ok to d/c trulicity. Will send rx for generic januiva 100mg  1 tab daily to pharmacy

## 2020-01-14 NOTE — Telephone Encounter (Signed)
No patient should just taking 1 tab of 100mg  januiva. It was not listed on her active med list. Will add actos 1 tab daily to replace trulicity

## 2020-01-14 NOTE — Telephone Encounter (Signed)
Patient said that she is already taking Januvia 100mg .  I informed pt that it was not on her current medication list but she said that she has been taking it. Is the pt to continue taking the one she has and the new?  Please advise. CB#[phone]

## 2020-01-14 NOTE — Telephone Encounter (Signed)
Patient said that the itching started after dosage increase in Nov, 2021.  Patient explains that the itching starts after injection and does not completely go away but calms down.  No, new detergents, soaps or lotions or pets.

## 2020-01-25 ENCOUNTER — Telehealth: Payer: Self-pay

## 2020-01-25 MED ORDER — METFORMIN HCL 500 MG PO TABS
500.0000 mg | ORAL_TABLET | Freq: Every day | ORAL | 3 refills | Status: DC
Start: 2020-01-25 — End: 2020-12-29

## 2020-01-25 MED ORDER — LOSARTAN POTASSIUM 50 MG PO TABS: 50.0000 mg | ORAL_TABLET | Freq: Every day | ORAL | 3 refills | Status: DC

## 2020-01-25 NOTE — Telephone Encounter (Signed)
Last OV 12/02/19 Last fill for both medications  01/02/19  #90/3

## 2020-01-28 MED ORDER — LOSARTAN POTASSIUM 50 MG PO TABS
50.0000 mg | ORAL_TABLET | Freq: Every day | ORAL | 0 refills | Status: DC
Start: 2020-01-28 — End: 2020-04-25

## 2020-01-28 NOTE — Telephone Encounter (Signed)
RX sent to the pharmacy and patient notified VIA phone.  Dm/cma ° °

## 2020-01-28 NOTE — Addendum Note (Signed)
Addended by: Konrad Saha on: 01/28/2020 01:46 PM   Modules accepted: Orders

## 2020-01-28 NOTE — Telephone Encounter (Signed)
Mail order pharmacy is out of losartan. Please resend RX for losartan to   [id number] - [place], [place] Phone:  [phone]  Fax:  [phone]

## 2020-02-06 ENCOUNTER — Other Ambulatory Visit: Payer: Self-pay | Admitting: Family Medicine

## 2020-02-10 ENCOUNTER — Other Ambulatory Visit: Payer: Self-pay

## 2020-02-10 MED ORDER — GLIPIZIDE ER 10 MG PO TB24: 10.0000 mg | ORAL_TABLET | Freq: Every day | ORAL | 1 refills | Status: DC

## 2020-02-10 NOTE — Telephone Encounter (Signed)
LR- 01/07/19, #90, 3 rf's LOV 11/20/19 FOV 08/24/20

## 2020-02-25 ENCOUNTER — Telehealth: Payer: Self-pay | Admitting: Cardiovascular Disease

## 2020-02-25 DIAGNOSIS — R0602 Shortness of breath: Secondary | ICD-10-CM

## 2020-02-25 NOTE — Addendum Note (Signed)
Addended by: Devra Dopp E on: 02/25/2020 03:57 PM   Modules accepted: Orders

## 2020-02-25 NOTE — Telephone Encounter (Signed)
Hard to know etiology by phone Needs CXR, BMET, CBC BNP and f/u echo for known moderate AS F/u next week after tests done

## 2020-02-25 NOTE — Telephone Encounter (Signed)
    Pt called back, she said she got disconnected. She said the nurse can call her back

## 2020-02-25 NOTE — Telephone Encounter (Signed)
Lm to call back ./cy 

## 2020-02-25 NOTE — Telephone Encounter (Addendum)
Rn called and patient stated that she was having SOB and pain in her chest area with exertion,but after rest for a little while her sob will improve along with her chest pain.  Pt denied any weight gain, pt did state she had a little ankle swelling. Pt denied any fever or headaches, did state she had a cough at times, but not ongoing.   Per DOD/Dr. Angelena Form;  forward to Dr. Johnsie Cancel for review and recommendation

## 2020-02-25 NOTE — Telephone Encounter (Signed)
Pt c/o Shortness Of Breath: STAT if SOB developed within the last 24 hours or pt is noticeably SOB on the phone  1. Are you currently SOB (can you hear that pt is SOB on the phone)? Yes.  2. How long have you been experiencing SOB? 3 weeks.  3. Are you SOB when sitting or when up moving around? With exertion.  4. Are you currently experiencing any other symptoms? Patient states that when she has SOB he throat feels like its straining. Please advise.   Pt c/o of Chest Pain: STAT if CP now or developed within 24 hours  1. Are you having CP right now? No.  2. Are you experiencing any other symptoms (ex. SOB, nausea, vomiting, sweating)? Aches in chest area.  3. How long have you been experiencing CP? 3 weeks  4. Is your CP continuous or coming and going? Coming and going.  5. Have you taken Nitroglycerin? No.   Patient is calling in with chest pains and SOB, she states that she would like to be seen by Dr. Johnsie Cancel before her appointment in April. Please advise.  ?

## 2020-02-25 NOTE — Telephone Encounter (Signed)
Pt aware of recommendations and verbalizes understanding ./cy 

## 2020-02-26 ENCOUNTER — Other Ambulatory Visit: Payer: Medicare Other | Admitting: *Deleted

## 2020-02-26 ENCOUNTER — Ambulatory Visit
Admission: RE | Admit: 2020-02-26 | Discharge: 2020-02-26 | Disposition: A | Discharge status: Home / Self Care | Payer: Medicare Other | Source: Ambulatory Visit | Attending: Cardiovascular Disease | Admitting: Cardiovascular Disease

## 2020-02-26 ENCOUNTER — Other Ambulatory Visit: Payer: Medicare Other

## 2020-02-26 ENCOUNTER — Other Ambulatory Visit: Payer: Self-pay

## 2020-02-26 DIAGNOSIS — R0602 Shortness of breath: Secondary | ICD-10-CM

## 2020-02-26 LAB — CBC
Hematocrit: 36.7 % (ref 34.0–46.6)
Hemoglobin: 12.3 g/dL (ref 11.1–15.9)
MCH: 29 pg (ref 26.6–33.0)
MCHC: 33.5 g/dL (ref 31.5–35.7)
MCV: 87 fL (ref 79–97)
Platelets: 222 10*3/uL (ref 150–450)
RBC: 4.24 x10E6/uL (ref 3.77–5.28)
RDW: 13.1 % (ref 11.7–15.4)
WBC: 7 10*3/uL (ref 3.4–10.8)

## 2020-02-26 LAB — BASIC METABOLIC PANEL
BUN/Creatinine Ratio: 17 (ref 12–28)
BUN: 24 mg/dL (ref 8–27)
CO2: 21 mmol/L (ref 20–29)
Calcium: 9 mg/dL (ref 8.7–10.3)
Chloride: 99 mmol/L (ref 96–106)
Creatinine, Ser: 1.45 mg/dL — ABNORMAL HIGH (ref 0.57–1.00)
GFR calc Af Amer: 40 mL/min/{1.73_m2} — ABNORMAL LOW (ref >59)
GFR calc non Af Amer: 35 mL/min/{1.73_m2} — ABNORMAL LOW (ref >59)
Glucose: 318 mg/dL — ABNORMAL HIGH (ref 65–99)
Potassium: 4.3 mmol/L (ref 3.5–5.2)
Sodium: 133 mmol/L — ABNORMAL LOW (ref 134–144)

## 2020-02-27 LAB — PRO B NATRIURETIC PEPTIDE: NT-Pro BNP: 1501 pg/mL — ABNORMAL HIGH (ref 0–738)

## 2020-02-29 ENCOUNTER — Ambulatory Visit (HOSPITAL_COMMUNITY): Payer: Medicare Other | Attending: Cardiovascular Disease

## 2020-02-29 ENCOUNTER — Telehealth: Payer: Self-pay | Admitting: Cardiovascular Disease

## 2020-02-29 ENCOUNTER — Other Ambulatory Visit: Payer: Self-pay

## 2020-02-29 DIAGNOSIS — R7989 Other specified abnormal findings of blood chemistry: Secondary | ICD-10-CM

## 2020-02-29 DIAGNOSIS — Z79899 Other long term (current) drug therapy: Secondary | ICD-10-CM

## 2020-02-29 DIAGNOSIS — R0602 Shortness of breath: Secondary | ICD-10-CM | POA: Diagnosis not present

## 2020-02-29 LAB — ECHOCARDIOGRAM COMPLETE
AR max vel: 0.61 cm2
AV Area VTI: 0.66 cm2
AV Area mean vel: 0.54 cm2
AV Mean grad: 33 mmHg
AV Peak grad: 48.7 mmHg
Ao pk vel: 3.49 m/s
Area-P 1/2: 6.71 cm2
MV M vel: 6.58 m/s
MV Peak grad: 172.9 mmHg
Radius: 0.65 cm
S' Lateral: 2.6 cm

## 2020-02-29 MED ORDER — FUROSEMIDE 20 MG PO TABS: 20.0000 mg | ORAL_TABLET | Freq: Every day | ORAL | 3 refills | Status: DC

## 2020-02-29 NOTE — Telephone Encounter (Signed)
Per Dr. Johnsie Cancel, BNP elevated but her CXR was normal stop chlorthalidone and start lasix 20 mg daily f/u bmet in 3 weeks we will see what her echo shows. Patient's Glucose was also high, sent results to patient's PCP. Patient will start Lasix 20 mg daily and get lab work at her next appointment.

## 2020-02-29 NOTE — Progress Notes (Signed)
Cardiology Office Note:    Date:  03/04/2020   ID:  [name], DOB May 21, 1940, MRN [id number]  PCP:  Ronnald Nian, DO  [facility] Cardiologist:  [name], MD  [facility] Electrophysiologist:  None   Referring MD: Ronnald Nian, DO     History of Present Illness:    [name] is a 80 y.o. female with a hx of CAD status post PCI 2007, hypertension, DM, hyperlipidemia, history of breast cancer, PAD and severe aortic stenosis who presents to clinic for follow-up of severe AS.  Patient underwent cardiac cath in 2007 with PCI/DES to the RCA.  Repeat cath in 2008 showed patent RCA stent with no significant left-sided system disease.  She had breast cancer in 2012 and has gone without recurrence.  Had a Myoview in 2017 with no ischemia and EF noted at 59%.   Patient recently called with worsening SOB on exertion that has been ongoing for the past 3 weeks Labs notable for Na 133, Cr 1.45, BNP 1501.  TTE which revealed severe AS Vmax 3.5 m/s, MG 33 mmHG, AVA 0.66 cm2, DI 0.26. The  SVi = 34 cc/m2. Patient was referred as an urgent visit for further management.  Patient states that her breathing is improved, but she is having cramping in the legs and low blood pressure. No LE edema, orthopnea, PND. Having some lightheadedness but no syncope. She is feeling fatigued with the low blood pressure. Has some chest "twinges" with exertion as well as SOB. Not able to walk very far without needing to stop but this is mainly driven by arthritic pain in the knees.   Past Medical History:  Diagnosis Date  . Allergy   . Anemia    childhood  . Anxiety   . Arterial stenosis (HCC)    mesenteric  . Arthritis   . Breast cancer ([place]) 12/05/10   R breast, inv mammary, in situ,ER/PR +,HER2 -  . Cancer ([place])   . Carcinoma of breast treated with adjuvant chemotherapy ([place])   . Coronary artery disease    stent 2007  . Depression   . Diabetes mellitus   . Difficulty sleeping    . Diverticulosis 12/10/03  . Elevated cholesterol   . Generalized weakness   . GERD (gastroesophageal reflux disease)   . Heart murmur    secondary to chem. 2013  . Hepatitis    as an infant  . HSV-1 (herpes simplex virus 1) infection   . Hyperlipidemia   . Hypertension   . Hypothyroidism   . Osteoarthritis   . Personal history of chemotherapy   . Personal history of radiation therapy   . Pneumonia    2014 september  . 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 Spinal stenosis   . Thyroid disease     Past Surgical History:  Procedure Laterality Date  . ABDOMINAL AORTOGRAM W/LOWER EXTREMITY Bilateral 04/29/2019   Procedure: ABDOMINAL AORTOGRAM W/LOWER EXTREMITY;  Surgeon: Marty Heck, MD;  Location: [facility];  Service: Cardiovascular;  Laterality: Bilateral;  . ABDOMINAL AORTOGRAM W/LOWER EXTREMITY Left 07/01/2019   Procedure: ABDOMINAL AORTOGRAM W/ Left LOWER EXTREMITY [place];  Surgeon: Marty Heck, MD;  Location: [facility];  Service: Cardiovascular;  Laterality: Left;  . ANTERIOR AND POSTERIOR REPAIR N/A 04/05/2016   Procedure: ANTERIOR (CYSTOCELE) AND POSTERIOR REPAIR (RECTOCELE);  Surgeon: [name], MD;  Location: [facility];  Service: Gynecology;  Laterality: N/A;  .  BREAST BIOPSY    . BREAST LUMPECTOMY  1983   benign biopsy  . BREAST LUMPECTOMY Right 12/29/2010   snbx, ER/PR +, Her2 -, 0/1 node pos.  . CAROTID STENT  2007  . CARPAL TUNNEL RELEASE Bilateral 9/12,8,12  . CHOLECYSTECTOMY    . COLONOSCOPY N/A 02/09/2013   Procedure: COLONOSCOPY;  Surgeon: [name], MD;  Location: [facility];  Service: Endoscopy;  Laterality: N/A;  . COLONOSCOPY    . CORONARY ANGIOPLASTY    . DILATION AND CURETTAGE OF UTERUS    . ESOPHAGOGASTRODUODENOSCOPY N/A 02/09/2013   Procedure: ESOPHAGOGASTRODUODENOSCOPY (EGD);  Surgeon: [name], MD;  Location: [name];  Service: Endoscopy;  Laterality: N/A;  . FOOT SPURS    .  HYSTEROSCOPY WITH D & C N/A 09/11/2012   Procedure: DILATATION AND CURETTAGE ;  Surgeon: [name], MD;  Location: [facility];  Service: Gynecology;  Laterality: N/A;  . KNEE SURGERY  2007  . LAPAROSCOPIC ASSISTED VAGINAL HYSTERECTOMY N/A 04/05/2016   Procedure: LAPAROSCOPIC ASSISTED VAGINAL HYSTERECTOMY possible BSO;  Surgeon: [name], MD;  Location: [facility];  Service: Gynecology;  Laterality: N/A;  . LUMBAR LAMINECTOMY/DECOMPRESSION MICRODISCECTOMY N/A 10/20/2014   Procedure: MICRO LUMBER DECOMPRESSION L3-4 L4-5;  Surgeon: [name], MD;  Location: [facility];  Service: Orthopedics;  Laterality: N/A;  . LUMBAR LAMINECTOMY/DECOMPRESSION MICRODISCECTOMY Bilateral 10/13/2015   Procedure: MICRO LUMBAR DECOMPRESSION L5 - S1 AND REDO DECOMPRESSION L4 - L5 AND REMOVAL OF FACET CYST L4 - L5 2 LEVELS;  Surgeon: [name], MD;  Location: [facility];  Service: Orthopedics;  Laterality: Bilateral;  . PERIPHERAL VASCULAR INTERVENTION Right 04/29/2019   Procedure: PERIPHERAL VASCULAR INTERVENTION;  Surgeon: Marty Heck, MD;  Location: [facility];  Service: Cardiovascular;  Laterality: Right;  . PORT-A-CATH REMOVAL  04/17/2011   Procedure: REMOVAL PORT-A-CATH;  Surgeon: [name], MD;  Location: [facility];  Service: General;  Laterality: Left;  . PORTACATH PLACEMENT  02/07/2011   Procedure: INSERTION PORT-A-CATH;  Surgeon: [name], MD;  Location: [facility];  Service: General;  Laterality: N/A;  . UPPER GASTROINTESTINAL ENDOSCOPY      Current Medications: Current Meds  Medication Sig  . Accu-Chek FastClix Lancets MISC USE TO CHECK BLOOD SUGARS 3 TIMES DAILY  . acetaminophen (TYLENOL) 500 MG tablet Take 1,000 mg by mouth every 6 (six) hours as needed for moderate pain or headache.  Marland Kitchen aspirin EC 81 MG tablet Take 81 mg by mouth at bedtime.  . calcium carbonate (TUMS - DOSED IN MG ELEMENTAL CALCIUM) 500 MG chewable tablet Chew 2 tablets by mouth 3 (three) times  daily as needed for indigestion or heartburn.  . cholecalciferol (VITAMIN D) 25 MCG (1000 UNIT) tablet Take 1,000 Units by mouth daily.  . clobetasol cream (TEMOVATE) 3.89 % Apply 1 application topically 2 (two) times daily.  . ferrous sulfate (FERROUSUL) 325 (65 FE) MG tablet Take 1 tablet (325 mg total) by mouth daily with breakfast.  . glipiZIDE (GLUCOTROL XL) 10 MG 24 hr tablet Take 1 tablet (10 mg total) by mouth daily at 12 noon.  Marland Kitchen glucose blood (ACCU-CHEK GUIDE) test strip TEST ONCE DAILY  . Golimumab ([place] ARIA IV) Inject 1 Dose into the vein See admin instructions. Every 2 months.  . levothyroxine (SYNTHROID) 88 MCG tablet Take 1 tablet (88 mcg total) by mouth daily.  Marland Kitchen losartan (COZAAR) 50 MG tablet Take 1 tablet (50 mg total) by mouth daily.  . melatonin 3 MG TABS tablet Take 3  mg by mouth at bedtime.  . metFORMIN (GLUCOPHAGE) 500 MG tablet Take 1 tablet (500 mg total) by mouth daily with breakfast.  . nitroGLYCERIN (NITROSTAT) 0.4 MG SL tablet Place 1 tablet (0.4 mg total) under the tongue every 5 (five) minutes as needed for chest pain.  . Omega-3 Fatty Acids (FISH OIL) 1000 MG CAPS Take 1,000 mg by mouth in the morning and at bedtime.  . pantoprazole (PROTONIX) 40 MG tablet Take 1 tablet (40 mg total) by mouth daily.  Marland Kitchen PARoxetine (PAXIL) 20 MG tablet Take 1 tablet (20 mg total) by mouth in the morning.  . pioglitazone (ACTOS) 30 MG tablet Take 1 tablet (30 mg total) by mouth daily.  . sitaGLIPtin (JANUVIA) 100 MG tablet Take 1 tablet (100 mg total) by mouth daily.  . valACYclovir (VALTREX) 1000 MG tablet TAKE 1 TABLET BY MOUTH 2  TIMES DAILY AS NEEDED FOR  VIRAL INFECTION.  . vitamin B-12 (CYANOCOBALAMIN) 1000 MCG tablet Take 1,000 mcg by mouth daily.  . [DISCONTINUED] furosemide (LASIX) 20 MG tablet Take 1 tablet (20 mg total) by mouth daily.  . [DISCONTINUED] leflunomide (ARAVA) 20 MG tablet Take 20 mg by mouth daily.     Allergies:   Codeine; Antihistamines,  diphenhydramine-type; Gabapentin; Statins; Sulfa antibiotics; and Erythromycin   Social History   Socioeconomic History  . Marital status: Married    Spouse name: Not on file  . Number of children: 2  . Years of education: Not on file  . Highest education level: Not on file  Occupational History  . Occupation: retired  Tobacco Use  . Smoking status: Former Smoker    Packs/day: 2.00    Years: 40.00    Pack years: 80.00    Quit date: 12/19/1997    Years since quitting: 22.2  . Smokeless tobacco: Never Used  Vaping Use  . Vaping Use: Never used  Substance and Sexual Activity  . Alcohol use: Yes    Alcohol/week: 6.0 standard drinks    Types: 6 Glasses of wine per week    Comment: occasional  . Drug use: No  . Sexual activity: Not on file    Comment: menarch age 32, P3, HRT X 10 YRS, MENOPAUSE MID 40'S  Other Topics Concern  . Not on file  Social History Narrative   [name] age 31- [place] age 72  [place]   Social Determinants of Health   Financial Resource Strain: Low Risk   . Difficulty of Paying Living Expenses: Not hard at all  Food Insecurity: No Food Insecurity  . Worried About Charity fundraiser in the Last Year: Never true  . Ran Out of Food in the Last Year: Never true  Transportation Needs: No Transportation Needs  . Lack of Transportation (Medical): No  . Lack of Transportation (Non-Medical): No  Physical Activity: Inactive  . Days of Exercise per Week: 0 days  . Minutes of Exercise per Session: 0 min  Stress: No Stress Concern Present  . Feeling of Stress : Not at all  Social Connections: Socially Integrated  . Frequency of Communication with Friends and Family: More than three times a week  . Frequency of Social Gatherings with Friends and Family: More than three times a week  . Attends Religious Services: More than 4 times per year  . Active Member of Clubs or Organizations: Yes  . Attends Archivist Meetings: More than 4 times  per year  . Marital Status: Married  Family History: The patient's family history includes Alcoholism in her father; Cancer in her son; Diabetes in her brother; Heart attack in her brother and father; Heart disease in her mother; Hypertension in her brother; Lymphoma (age of onset: 21) in her brother; Throat cancer in her father. There is no history of Colon cancer, Stroke, Esophageal cancer, Rectal cancer, or Stomach cancer.  ROS:   Please see the history of present illness.    Review of Systems  Constitutional: Positive for malaise/fatigue. Negative for chills and fever.  HENT: Negative for nosebleeds.   Eyes: Negative for blurred vision and redness.  Respiratory: Positive for shortness of breath.   Cardiovascular: Positive for chest pain. Negative for palpitations, orthopnea, claudication, leg swelling and PND.  Gastrointestinal: Negative for melena, nausea and vomiting.  Genitourinary: Negative for hematuria.  Musculoskeletal: Positive for joint pain and myalgias. Negative for falls.  Neurological: Negative for dizziness and loss of consciousness.  Endo/Heme/Allergies: Negative for polydipsia.  Psychiatric/Behavioral: Negative for substance abuse.    EKGs/Labs/Other Studies Reviewed:    The following studies were reviewed today: TTE 03/26/2020: IMPRESSIONS  1. The AoV is not well visualized but appears severely calcified with  restricted leaflet motion. Vmax 3.5 m/s, MG 33 mmHG, AVA 0.66 cm2, DI  0.26. The LV cavity is small and SVi = 34 cc/m2. Finding could represent  low flow low gradient severe AS, and  would recommend either TEE or aortic valve calcium score for clarification  if clinically indicated. The aortic valve is calcified. There is severe  calcifcation of the aortic valve. There is severe thickening of the aortic  valve. Aortic valve  regurgitation is not visualized. Moderate to severe aortic valve stenosis.  2. MR is now moderate and likely worsened in the  setting of AS  progression. The mitral valve is grossly normal. Moderate mitral valve  regurgitation. No evidence of mitral stenosis.  3. Left ventricular ejection fraction, by estimation, is 65 to 70%. The  left ventricle has normal function. The left ventricle has no regional  wall motion abnormalities. There is moderate concentric left ventricular  hypertrophy. Left ventricular  diastolic function could not be evaluated.  4. Right ventricular systolic function is normal. The right ventricular  size is normal. Tricuspid regurgitation signal is inadequate for assessing  PA pressure.  5. The inferior vena cava is normal in size with greater than 50%  respiratory variability, suggesting right atrial pressure of 3 mmHg.  Abdominal aortogram 06/2019: Aortogram shows a diseased distal infrarenal aorta with approximate 50% stenosis from calcification but the iliacs themselves appear patent. She does have excellent left femoral pulse distally.  Left common femoral and profunda are widely patent and the SFA is moderately diseased throughout its course without any flow-limiting stenosis. Approximate 50% stenosis at Hunter's canal. Approximate 50% stenosis at popliteal artery behind the knee. Patient has single-vessel runoff via peroneal artery with a greater than 60 to 70% stenosis in the TP trunk as well as at least 3 stenosis high-grade in the distal peroneal artery. Even though she has single-vessel peroneal runoff she has very brisk inflow down the left lower extremity with filling of the distal AT and dorsalis pedis at the ankle through a collateral.  Ultimately after evaluating the pictures,I elected not to intervene given single-vessel peroneal runoff in the setting of claudication. Discussed with the patient I would let her symptoms progress before potentially intervening on single-vessel peroneal runoff given her symptoms at this time and no evidence of critical limb  ischemia.  Myoview 2017:   Nuclear stress EF: 59%. Normal wall motion  There was no ST segment deviation noted during stress.  This is a low risk study. No perfusion defects, no ischemia  Recent Labs: 04/29/2019: ALT 18 06/23/2019: TSH 2.44 02/26/2020: BUN 24; Creatinine, Ser 1.45; Hemoglobin 12.3; NT-Pro BNP 1,501; Platelets 222; Potassium 4.3; Sodium 133  Recent Lipid Panel    Component Value Date/Time   CHOL 189 06/24/2019 1416   TRIG 292.0 (H) 06/24/2019 1416   HDL 29.60 (L) 06/24/2019 1416   CHOLHDL 6 06/24/2019 1416   VLDL 58.4 (H) 06/24/2019 1416   LDLCALC 83 05/13/2008 0929   LDLDIRECT 101.0 06/24/2019 1416     Physical Exam:    VS:  BP (!) 102/58   Pulse 94   Ht '5\' 3"'  (1.6 m)   Wt 143 lb 9.6 oz (65.1 kg)   SpO2 98%   BMI 25.44 kg/m     Wt Readings from Last 3 Encounters:  03/04/20 143 lb 9.6 oz (65.1 kg)  12/02/19 142 lb 9.6 oz (64.7 kg)  11/20/19 143 lb 12.8 oz (65.2 kg)     GEN:  Well nourished, well developed in no acute distress HEENT: Normal NECK: No JVD; No carotid bruits CARDIAC: RRR, 2/6 harsh, holosystolic murmur heard throughout the precordium. No rubs or gallops RESPIRATORY:  Clear to auscultation without rales, wheezing or rhonchi  ABDOMEN: Soft, non-tender, non-distended MUSCULOSKELETAL:  No edema; No deformity  SKIN: Warm and dry NEUROLOGIC:  Alert and oriented x 3 PSYCHIATRIC:  Normal affect   ASSESSMENT:    1. Medication management   2. Essential hypertension   3. Aortic valve stenosis, etiology of cardiac valve disease unspecified   4. SOB (shortness of breath)    PLAN:    In order of problems listed above:  #Severe, symptomatic aortic stenosis: Patient with known history of AS that has now progressed to severe. Vmax 3.5 m/s, MG 33 mmHG, AVA 0.66 cm2, DI 0.26. The  SVi = 34 cc/m2. Symptomatic with worsening dyspnea on exertion, mild chest pain on exertion, and recent HF exacerbation with BNP 1501. Given history of chest radiation  due to breast cancer, likely a TAVR candidate over surgery. Will refer to structural heart team for further management. -Refer to structural heart team   #Acute on chronic diastolic heart failure: Likely triggered in the setting of severe AS. Volume status much improved on exam today and appears euvolemic. With continued NYHA class II-III symptoms due to severe AS. Want to be careful to not drop preload significantly so will decrease lasix today. -Volume status improved -Decrease lasix to M, W, F -Repeat BMET with Mg and BNP today -Recommended Mg supplementation for muscle cramps; may need K pending labs -Continue losartan 16m daily -Low Na diet -Patient will monitor daily weights and call if she is noticing significant weight gain or worsening symptoms -Close follow-up in 2 weeks -Manage AS as above  #Peripheral vascular disease s/p right SFA stent: -Continue ASA  -Intolerant to statins  #CAD s/p PCI to RCA: -Continue ASA  #DMII: -On insulin, metformin, glipizide  #HTN: -Continue losartan   Medication Adjustments/Labs and Tests Ordered: Current medicines are reviewed at length with the patient today.  Concerns regarding medicines are outlined above.  Orders Placed This Encounter  Procedures  . Basic metabolic panel  . Magnesium  . Pro b natriuretic peptide (BNP)  . Ambulatory referral to Structural [facility]ve [facility] (only at [facility]   Meds ordered this encounter  Medications  . furosemide (LASIX) 20 MG tablet    Sig: Take 1 tablet (20 mg total) by mouth every Monday, Wednesday, and Friday.    Dispense:  90 tablet    Refill:  3    Patient Instructions  Medication Instructions:  Please decrease your Furosemide to Monday, Wed and Friday. Continue all other medications as listed.  *If you need a refill on your cardiac medications before your next appointment, please call your pharmacy*  Lab Work: Please have blood work today (BMP, Mg and Pro-BNP)  If you  have labs (blood work) drawn today and your tests are completely normal, you will receive your results only by: Marland Kitchen MyChart Message (if you have MyChart) OR . A paper copy in the mail If you have any lab test that is abnormal or we need to change your treatment, we will call you to review the results.  You have been referred to our Structural Heart Program and will be contacted to be scheduled.  Follow-Up: At [facility], you and your health needs are our priority.  As part of our continuing mission to provide you with exceptional heart care, we have created designated Provider Care Teams.  These Care Teams include your primary Cardiologist (physician) and Advanced Practice Providers (APPs -  Physician Assistants and Nurse Practitioners) who all work together to provide you with the care you need, when you need it.  We recommend signing up for the patient portal called "MyChart".  Sign up information is provided on this After Visit Summary.  MyChart is used to connect with patients for Virtual Visits (Telemedicine).  Patients are able to view lab/test results, encounter notes, upcoming appointments, etc.  Non-urgent messages can be sent to your provider as well.   To learn more about what you can do with MyChart, go to [email or web address].    Your next appointment:   2-3 week(s)  The format for your next appointment:   In Person  Provider:   [name], MD  Thank you for choosing [facility]!!         Signed, Freada Bergeron, MD  03/04/2020 10:25 AM    [place]

## 2020-02-29 NOTE — Telephone Encounter (Signed)
    Pt returning call for her lab result

## 2020-03-04 ENCOUNTER — Ambulatory Visit (INDEPENDENT_AMBULATORY_CARE_PROVIDER_SITE_OTHER): Payer: Medicare Other | Admitting: Cardiology

## 2020-03-04 ENCOUNTER — Encounter: Payer: Self-pay | Admitting: Cardiology

## 2020-03-04 ENCOUNTER — Other Ambulatory Visit: Payer: Self-pay

## 2020-03-04 VITALS — BP 102/58 | HR 94 | Ht 63.0 in | Wt 143.6 lb

## 2020-03-04 DIAGNOSIS — Z79899 Other long term (current) drug therapy: Secondary | ICD-10-CM | POA: Diagnosis not present

## 2020-03-04 DIAGNOSIS — R0602 Shortness of breath: Secondary | ICD-10-CM | POA: Diagnosis not present

## 2020-03-04 DIAGNOSIS — I350 Nonrheumatic aortic (valve) stenosis: Secondary | ICD-10-CM | POA: Diagnosis not present

## 2020-03-04 DIAGNOSIS — I10 Essential (primary) hypertension: Secondary | ICD-10-CM

## 2020-03-04 MED ORDER — FUROSEMIDE 20 MG PO TABS: 20.0000 mg | ORAL_TABLET | ORAL | 3 refills | Status: DC

## 2020-03-04 NOTE — Patient Instructions (Signed)
Medication Instructions:  Please decrease your Furosemide to Monday, Wed and Friday. Continue all other medications as listed.  *If you need a refill on your cardiac medications before your next appointment, please call your pharmacy*  Lab Work: Please have blood work today (BMP, Mg and Pro-BNP)  If you have labs (blood work) drawn today and your tests are completely normal, you will receive your results only by: Marland Kitchen MyChart Message (if you have MyChart) OR . A paper copy in the mail If you have any lab test that is abnormal or we need to change your treatment, we will call you to review the results.  You have been referred to our Structural Heart Program and will be contacted to be scheduled.  Follow-Up: At [facility], you and your health needs are our priority.  As part of our continuing mission to provide you with exceptional heart care, we have created designated Provider Care Teams.  These Care Teams include your primary Cardiologist (physician) and Advanced Practice Providers (APPs -  Physician Assistants and Nurse Practitioners) who all work together to provide you with the care you need, when you need it.  We recommend signing up for the patient portal called "MyChart".  Sign up information is provided on this After Visit Summary.  MyChart is used to connect with patients for Virtual Visits (Telemedicine).  Patients are able to view lab/test results, encounter notes, upcoming appointments, etc.  Non-urgent messages can be sent to your provider as well.   To learn more about what you can do with MyChart, go to [email or web address].    Your next appointment:   2-3 week(s)  The format for your next appointment:   In Person  Provider:   [name], MD  Thank you for choosing [facility]!!

## 2020-03-05 LAB — BASIC METABOLIC PANEL
BUN/Creatinine Ratio: 19 (ref 12–28)
BUN: 35 mg/dL — ABNORMAL HIGH (ref 8–27)
CO2: 23 mmol/L (ref 20–29)
Calcium: 9.3 mg/dL (ref 8.7–10.3)
Chloride: 96 mmol/L (ref 96–106)
Creatinine, Ser: 1.83 mg/dL — ABNORMAL HIGH (ref 0.57–1.00)
GFR calc Af Amer: 30 mL/min/{1.73_m2} — ABNORMAL LOW (ref >59)
GFR calc non Af Amer: 26 mL/min/{1.73_m2} — ABNORMAL LOW (ref >59)
Glucose: 322 mg/dL — ABNORMAL HIGH (ref 65–99)
Potassium: 4.2 mmol/L (ref 3.5–5.2)
Sodium: 134 mmol/L (ref 134–144)

## 2020-03-05 LAB — MAGNESIUM: Magnesium: 1.5 mg/dL — ABNORMAL LOW (ref 1.6–2.3)

## 2020-03-05 LAB — PRO B NATRIURETIC PEPTIDE: NT-Pro BNP: 897 pg/mL — ABNORMAL HIGH (ref 0–738)

## 2020-03-07 ENCOUNTER — Telehealth: Payer: Self-pay

## 2020-03-07 NOTE — Telephone Encounter (Signed)
Left the patient a message to call back regarding recent results.

## 2020-03-07 NOTE — Telephone Encounter (Signed)
-----   Message from Freada Bergeron, MD sent at 03/07/2020  8:13 AM EST ----- Labs show that her kidney function is a little on the higher end and her magnesium level is low. The low magnesium is probably the main driver of her leg cramping. She should take her supplemental magnesium 400mg  nightly as this will help. Fluid levels are looking better. Let's continue the lasix every M, W, Fri. We will repeat labs at her follow-up visit in a couple of weeks.

## 2020-03-07 NOTE — Telephone Encounter (Signed)
The patient has been notified of the result and verbalized understanding.  All questions (if any) were answered. Wilma Flavin, RN 03/07/2020 1:55 PM

## 2020-03-09 ENCOUNTER — Ambulatory Visit: Payer: Medicare Other | Admitting: Family Medicine

## 2020-03-10 NOTE — H&P (View-Only) (Signed)
[facility] Consult Note  CC: New patient:  Aortic stenosis  History of Present Illness: 80 yo female with history of diabetes mellitus, CAD with prior PCI, HTN, HLD, breast cancer, PAD and severe aortic stenosis who is here today as a new consult, referred by Dr. Johnsie Cancel, for further discussion regarding her aortic stenosis and possible TAVR. She is known to have CAD with prior PCI and stenting of the RCA in 2007. Repeat cath in 2008 with patent RCA stent and no significant left system disease. Nuclear stress test in 2017 with no ischemia. She was seen recently by Dr. Johney Frame and c/o dyspnea with exertion. Echo 02/29/20 with LVEF of 65-70%, moderate LVH. Moderate mitral valve regurgitation. The aortic valve leaflets are thickened and calcified with limited leaflet mobility. Mean gradient 33 mmHg, peak gradient 48.7 mmHg, AVA 0.54 cm2, dimensionless index 0.26, SVI 34. This is consistent with paradoxical low flow/low gradient aortic stenosis. She has been followed in our office by [facility] and followed for moderate aortic stenosis for many years. She has had breast cancer with lumpectomy, radiation and chemotherapy in 2012/2013. She has PAD and has had PV intervention on the right leg per Dr. Carlis Abbott. She had trouble with sedation during that procedure.   She tells me today that she had progressive dyspnea with exertion and fatigue. She has rare sharp chest pains. She has ankle edema that is improved with Lasix. No dizziness or near syncope. She has upper dentures and no active issues with her lower teeth. Her daughter is Art therapist. She lives with her husband in [place]. She is retired as a Radiation protection practitioner. [place].   Primary Care Physician: Ronnald Nian, DO Primary Cardiologist: Johnsie Cancel Referring Cardiologist: Johnsie Cancel  Past Medical History:  Diagnosis Date  . Allergy   . Anemia    childhood  . Anxiety   . Aortic stenosis   .  Arterial stenosis (HCC)    mesenteric  . Arthritis   . Breast cancer ([place]) 12/05/10   R breast, inv mammary, in situ,ER/PR +,HER2 -  . Cancer ([place] )   . Carcinoma of breast treated with adjuvant chemotherapy ([place])   . Coronary artery disease    stent 2007  . Depression   . Diabetes mellitus   . Difficulty sleeping   . Diverticulosis 12/10/03  . Elevated cholesterol   . Generalized weakness   . GERD (gastroesophageal reflux disease)   . Heart murmur    secondary to chem. 2013  . Hepatitis    as an infant  . HSV-1 (herpes simplex virus 1) infection   . Hyperlipidemia   . Hypertension   . Hypothyroidism   . Osteoarthritis   . Personal history of chemotherapy   . Personal history of radiation therapy   . Pneumonia    2014 september  . 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 Spinal stenosis   . Thyroid disease     Past Surgical History:  Procedure Laterality Date  . ABDOMINAL AORTOGRAM W/LOWER EXTREMITY Bilateral 04/29/2019   Procedure: ABDOMINAL AORTOGRAM W/LOWER EXTREMITY;  Surgeon: Marty Heck, MD;  Location: [place] [facility];  Service: Cardiovascular;  Laterality: Bilateral;  . ABDOMINAL AORTOGRAM W/LOWER EXTREMITY Left 07/01/2019   Procedure: ABDOMINAL AORTOGRAM W/ Left LOWER EXTREMITY [place];  Surgeon: Marty Heck, MD;  Location: [place] [facility];  Service: Cardiovascular;  Laterality: Left;  . ANTERIOR AND POSTERIOR REPAIR  N/A 04/05/2016   Procedure: ANTERIOR (CYSTOCELE) AND POSTERIOR REPAIR (RECTOCELE);  Surgeon: [name], MD;  Location: [facility];  Service: Gynecology;  Laterality: N/A;  . BREAST BIOPSY    . BREAST LUMPECTOMY  1983   benign biopsy  . BREAST LUMPECTOMY Right 12/29/2010   snbx, ER/PR +, Her2 -, 0/1 node pos.  . CARPAL TUNNEL RELEASE Bilateral 9/12,8,12  . CHOLECYSTECTOMY    . COLONOSCOPY N/A 02/09/2013   Procedure: COLONOSCOPY;  Surgeon: [name], MD;  Location: [facility];  Service: Endoscopy;  Laterality:  N/A;  . COLONOSCOPY    . CORONARY ANGIOPLASTY    . DILATION AND CURETTAGE OF UTERUS    . ESOPHAGOGASTRODUODENOSCOPY N/A 02/09/2013   Procedure: ESOPHAGOGASTRODUODENOSCOPY (EGD);  Surgeon: [name], MD;  Location: [name];  Service: Endoscopy;  Laterality: N/A;  . FOOT SPURS    . HYSTEROSCOPY WITH D & C N/A 09/11/2012   Procedure: DILATATION AND CURETTAGE ;  Surgeon: [name], MD;  Location: [facility];  Service: Gynecology;  Laterality: N/A;  . KNEE SURGERY  2007  . LAPAROSCOPIC ASSISTED VAGINAL HYSTERECTOMY N/A 04/05/2016   Procedure: LAPAROSCOPIC ASSISTED VAGINAL HYSTERECTOMY possible BSO;  Surgeon: [name], MD;  Location: [facility];  Service: Gynecology;  Laterality: N/A;  . LUMBAR LAMINECTOMY/DECOMPRESSION MICRODISCECTOMY N/A 10/20/2014   Procedure: MICRO LUMBER DECOMPRESSION L3-4 L4-5;  Surgeon: [name], MD;  Location: [facility];  Service: Orthopedics;  Laterality: N/A;  . LUMBAR LAMINECTOMY/DECOMPRESSION MICRODISCECTOMY Bilateral 10/13/2015   Procedure: MICRO LUMBAR DECOMPRESSION L5 - S1 AND REDO DECOMPRESSION L4 - L5 AND REMOVAL OF FACET CYST L4 - L5 2 LEVELS;  Surgeon: [name], MD;  Location: [facility];  Service: Orthopedics;  Laterality: Bilateral;  . PERIPHERAL VASCULAR INTERVENTION Right 04/29/2019   Procedure: PERIPHERAL VASCULAR INTERVENTION;  Surgeon: Marty Heck, MD;  Location: [facility];  Service: Cardiovascular;  Laterality: Right;  . PORT-A-CATH REMOVAL  04/17/2011   Procedure: REMOVAL PORT-A-CATH;  Surgeon: [name], MD;  Location: [facility];  Service: General;  Laterality: Left;  . PORTACATH PLACEMENT  02/07/2011   Procedure: INSERTION PORT-A-CATH;  Surgeon: [name], MD;  Location: [facility];  Service: General;  Laterality: N/A;  . UPPER GASTROINTESTINAL ENDOSCOPY      Current Outpatient Medications  Medication Sig Dispense Refill  . Accu-Chek FastClix Lancets MISC USE TO CHECK BLOOD SUGARS 3 TIMES DAILY  306 each 3  . acetaminophen (TYLENOL) 500 MG tablet Take 1,000 mg by mouth every 6 (six) hours as needed for moderate pain or headache.    Marland Kitchen aspirin EC 81 MG tablet Take 81 mg by mouth at bedtime.    . calcium carbonate (TUMS - DOSED IN MG ELEMENTAL CALCIUM) 500 MG chewable tablet Chew 2 tablets by mouth 3 (three) times daily as needed for indigestion or heartburn.    . cholecalciferol (VITAMIN D) 25 MCG (1000 UNIT) tablet Take 1,000 Units by mouth daily.    . clobetasol cream (TEMOVATE) 0.93 % Apply 1 application topically 2 (two) times daily. 30 g 0  . ferrous sulfate (FERROUSUL) 325 (65 FE) MG tablet Take 1 tablet (325 mg total) by mouth daily with breakfast. 30 tablet 3  . furosemide (LASIX) 20 MG tablet Take 1 tablet (20 mg total) by mouth every Monday, Wednesday, and Friday. 90 tablet 3  . glipiZIDE (GLUCOTROL XL) 10 MG 24 hr tablet Take 1 tablet (10 mg total) by mouth daily at 12 noon. 90 tablet 1  . glucose blood (  ACCU-CHEK GUIDE) test strip TEST ONCE DAILY 100 each 5  . Golimumab ([place] ARIA IV) Inject 1 Dose into the vein See admin instructions. Every 2 months.    . levothyroxine (SYNTHROID) 88 MCG tablet Take 1 tablet (88 mcg total) by mouth daily. 90 tablet 3  . losartan (COZAAR) 50 MG tablet Take 1 tablet (50 mg total) by mouth daily. 90 tablet 0  . melatonin 3 MG TABS tablet Take 3 mg by mouth at bedtime.    . metFORMIN (GLUCOPHAGE) 500 MG tablet Take 1 tablet (500 mg total) by mouth daily with breakfast. 90 tablet 3  . nitroGLYCERIN (NITROSTAT) 0.4 MG SL tablet Place 1 tablet (0.4 mg total) under the tongue every 5 (five) minutes as needed for chest pain. 25 tablet 3  . Omega-3 Fatty Acids (FISH OIL) 1000 MG CAPS Take 1,000 mg by mouth in the morning and at bedtime.    . pantoprazole (PROTONIX) 40 MG tablet Take 1 tablet (40 mg total) by mouth daily. 90 tablet 3  . PARoxetine (PAXIL) 20 MG tablet Take 1 tablet (20 mg total) by mouth in the morning. 90 tablet 3  . pioglitazone  (ACTOS) 30 MG tablet Take 1 tablet (30 mg total) by mouth daily. 90 tablet 1  . sitaGLIPtin (JANUVIA) 100 MG tablet Take 1 tablet (100 mg total) by mouth daily. 90 tablet 3  . valACYclovir (VALTREX) 1000 MG tablet TAKE 1 TABLET BY MOUTH 2  TIMES DAILY AS NEEDED FOR  VIRAL INFECTION. 180 tablet 0  . vitamin B-12 (CYANOCOBALAMIN) 1000 MCG tablet Take 1,000 mcg by mouth daily.     No current facility-administered medications for this visit.    Allergies  Allergen Reactions  . Codeine Nausea And Vomiting and Other (See Comments)    Severe stomach cramps  . Antihistamines, Diphenhydramine-Type Other (See Comments)    Causes hyperactivity  . Gabapentin     Urinary incontinence  . Statins Other (See Comments)    Joint pains  . Sulfa Antibiotics     Unknown to pt  . Erythromycin Hives and Rash    Due to dental work about 50 years ago. Was used in packing and resulted in rash/hives inside and outside of mouth.    Social History   Socioeconomic History  . Marital status: Married    Spouse name: Not on file  . Number of children: 3  . Years of education: Not on file  . Highest education level: Not on file  Occupational History  . Occupation: retired-bookkeeping  Tobacco Use  . Smoking status: Former Smoker    Packs/day: 2.00    Years: 40.00    Pack years: 80.00    Quit date: 12/19/1997    Years since quitting: 22.2  . Smokeless tobacco: Never Used  Vaping Use  . Vaping Use: Never used  Substance and Sexual Activity  . Alcohol use: Yes    Alcohol/week: 1.0 standard drink    Types: 1 Glasses of wine per week    Comment: occasional  . Drug use: No  . Sexual activity: Not on file    Comment: menarch age 78, P3, HRT X 10 YRS, MENOPAUSE MID 40'S  Other Topics Concern  . Not on file  Social History Narrative   [name] age 70- [place] age 20  [place]   Social Determinants of Health   Financial Resource Strain: Low Risk   . Difficulty of Paying Living Expenses:  Not hard at all  Food Insecurity:  No Food Insecurity  . Worried About Charity fundraiser in the Last Year: Never true  . Ran Out of Food in the Last Year: Never true  Transportation Needs: No Transportation Needs  . Lack of Transportation (Medical): No  . Lack of Transportation (Non-Medical): No  Physical Activity: Inactive  . Days of Exercise per Week: 0 days  . Minutes of Exercise per Session: 0 min  Stress: No Stress Concern Present  . Feeling of Stress : Not at all  Social Connections: Socially Integrated  . Frequency of Communication with Friends and Family: More than three times a week  . Frequency of Social Gatherings with Friends and Family: More than three times a week  . Attends Religious Services: More than 4 times per year  . Active Member of Clubs or Organizations: Yes  . Attends Archivist Meetings: More than 4 times per year  . Marital Status: Married  Human resources officer Violence: Not At Risk  . Fear of Current or Ex-Partner: No  . Emotionally Abused: No  . Physically Abused: No  . Sexually Abused: No    Family History  Problem Relation Age of Onset  . Throat cancer Father   . Alcoholism Father   . Heart attack Father   . Heart disease Mother   . Lymphoma Brother 57  . Cancer Son   . Heart attack Brother   . Diabetes Brother   . Hypertension Brother   . Colon cancer Neg Hx   . Stroke Neg Hx   . Esophageal cancer Neg Hx   . Rectal cancer Neg Hx   . Stomach cancer Neg Hx     Review of Systems:  As stated in the HPI and otherwise negative.   BP 132/70   Pulse 74   Ht _0  (1.6 m)   Wt 142 lb 9.6 oz (64.7 kg)   SpO2 99%   BMI 25.26 kg/m   Physical Examination: General: Well developed, well nourished, NAD  HEENT: OP clear, mucus membranes moist  SKIN: warm, dry. No rashes. Neuro: No focal deficits  Musculoskeletal: Muscle strength 5/5 all ext  Psychiatric: Mood and affect normal  Neck: No JVD, no carotid bruits, no thyromegaly, no  lymphadenopathy.  Lungs:Clear bilaterally, no wheezes, rhonci, crackles Cardiovascular: Regular rate and rhythm. Loud, harsh, late peaking systolic murmur.  Abdomen:Soft. Bowel sounds present. Non-tender.  Extremities: No lower extremity edema. Pulses are 2 + in the bilateral DP/PT.  EKG:  EKG is ordered today. The ekg ordered today demonstrates sinus, diffuse chronic ST and T wave abn  Echo 02/29/20:  1. The AoV is not well visualized but appears severely calcified with  restricted leaflet motion. Vmax 3.5 m/s, MG 33 mmHG, AVA 0.66 cm2, DI  0.26. The LV cavity is small and SVi = 34 cc/m2. Finding could represent  low flow low gradient severe AS, and  would recommend either TEE or aortic valve calcium score for clarification  if clinically indicated. The aortic valve is calcified. There is severe  calcifcation of the aortic valve. There is severe thickening of the aortic  valve. Aortic valve  regurgitation is not visualized. Moderate to severe aortic valve stenosis.  2. MR is now moderate and likely worsened in the setting of AS  progression. The mitral valve is grossly normal. Moderate mitral valve  regurgitation. No evidence of mitral stenosis.  3. Left ventricular ejection fraction, by estimation, is 65 to 70%. The  left ventricle has normal function. The left  ventricle has no regional  wall motion abnormalities. There is moderate concentric left ventricular  hypertrophy. Left ventricular  diastolic function could not be evaluated.  4. Right ventricular systolic function is normal. The right ventricular  size is normal. Tricuspid regurgitation signal is inadequate for assessing  PA pressure.  5. The inferior vena cava is normal in size with greater than 50%  respiratory variability, suggesting right atrial pressure of 3 mmHg.   Comparison(s): Changes from prior study are noted. Moderate to severe AS  is now present. Moderate MR is now present. LV function remains similar.    FINDINGS  Left Ventricle: Left ventricular ejection fraction, by estimation, is 65  to 70%. The left ventricle has normal function. The left ventricle has no  regional wall motion abnormalities. The left ventricular internal cavity  size was normal in size. There is  moderate concentric left ventricular hypertrophy. Left ventricular  diastolic function could not be evaluated due to mitral regurgitation  (moderate or greater). Left ventricular diastolic function could not be  evaluated.   Right Ventricle: The right ventricular size is normal. No increase in  right ventricular wall thickness. Right ventricular systolic function is  normal. Tricuspid regurgitation signal is inadequate for assessing PA  pressure.   Left Atrium: Left atrial size was normal in size.   Right Atrium: Right atrial size was normal in size.   Pericardium: Trivial pericardial effusion is present. Presence of  pericardial fat pad.   Mitral Valve: MR is now moderate and likely worsened in the setting of AS  progression. The mitral valve is grossly normal. Mild mitral annular  calcification. Moderate mitral valve regurgitation. No evidence of mitral  valve stenosis.   Tricuspid Valve: The tricuspid valve is grossly normal. Tricuspid valve  regurgitation is trivial. No evidence of tricuspid stenosis.   Aortic Valve: The AoV is not well visualized but appears severely  calcified with restricted leaflet motion. Vmax 3.5 m/s, MG 33 mmHG, AVA  0.66 cm2, DI 0.26. The LV cavity is small and SVi = 34 cc/m2. Finding  could represent low flow low gradient severe  AS, and would recommend either TEE or aortic valve calcium score for  clarification if clinically indicated. The aortic valve is calcified.  There is severe calcifcation of the aortic valve. There is severe  thickening of the aortic valve. Aortic valve  regurgitation is not visualized. Moderate to severe aortic stenosis is  present. Aortic valve mean  gradient measures 33.0 mmHg. Aortic valve peak  gradient measures 48.7 mmHg. Aortic valve area, by VTI measures 0.66 cm.   Pulmonic Valve: The pulmonic valve was grossly normal. Pulmonic valve  regurgitation is not visualized. No evidence of pulmonic stenosis.   Aorta: The aortic root and ascending aorta are structurally normal, with  no evidence of dilitation.   Venous: The inferior vena cava is normal in size with greater than 50%  respiratory variability, suggesting right atrial pressure of 3 mmHg.   IAS/Shunts: The atrial septum is grossly normal.     LEFT VENTRICLE  PLAX 2D  LVIDd:     4.10 cm Diastology  LVIDs:     2.60 cm LV e' medial:  5.55 cm/s  LV PW:     1.40 cm LV E/e' medial: 27.4  LV IVS:    1.38 cm LV e' lateral:  4.24 cm/s  LVOT diam:   1.81 cm LV E/e' lateral: 35.8  LV SV:     57  LV SV Index:  34  LVOT Area:   2.57 cm     RIGHT VENTRICLE  RV Basal diam: 2.30 cm  RV S prime:   9.57 cm/s  TAPSE (M-mode): 2.3 cm   LEFT ATRIUM       Index    RIGHT ATRIUM     Index  LA diam:    3.30 cm 1.97 cm/m RA Area:   8.07 cm  LA Vol (A2C):  35.9 ml 21.44 ml/m RA Volume:  14.80 ml 8.84 ml/m  LA Vol (A4C):  36.7 ml 21.91 ml/m  LA Biplane Vol: 36.1 ml 21.56 ml/m  AORTIC VALVE  AV Area (Vmax):  0.61 cm  AV Area (Vmean):  0.54 cm  AV Area (VTI):   0.66 cm  AV Vmax:      349.00 cm/s  AV Vmean:     265.000 cm/s  AV VTI:      0.864 m  AV Peak Grad:   48.7 mmHg  AV Mean Grad:   33.0 mmHg  LVOT Vmax:     82.50 cm/s  LVOT Vmean:    55.700 cm/s  LVOT VTI:     0.223 m  LVOT/AV VTI ratio: 0.26    AORTA  Ao Root diam: 3.20 cm  Ao Asc diam: 2.90 cm   MITRAL VALVE  MV Area (PHT):        SHUNTS  MV Decel Time:        Systemic VTI: 0.22 m  MR Peak grad:  172.9 mmHg Systemic Diam: 1.81 cm  MR Mean grad:  108.5 mmHg  MR Vmax:     657.50  cm/s  MR Vmean:    477.5 cm/s  MR PISA:     2.65 cm  MR PISA Eff ROA: 12 mm  MR PISA Radius: 0.65 cm  MV E velocity: 152.00 cm/s  MV A velocity: 168.00 cm/s  MV E/A ratio: 0.90   Recent Labs: 04/29/2019: ALT 18 06/23/2019: TSH 2.44 02/26/2020: Hemoglobin 12.3; Platelets 222 03/04/2020: BUN 35; Creatinine, Ser 1.83; Magnesium 1.5; NT-Pro BNP 897; Potassium 4.2; Sodium 134   Lipid Panel    Component Value Date/Time   CHOL 189 06/24/2019 1416   TRIG 292.0 (H) 06/24/2019 1416   HDL 29.60 (L) 06/24/2019 1416   CHOLHDL 6 06/24/2019 1416   VLDL 58.4 (H) 06/24/2019 1416   LDLCALC 83 05/13/2008 0929   LDLDIRECT 101.0 06/24/2019 1416     Wt Readings from Last 3 Encounters:  03/11/20 142 lb 9.6 oz (64.7 kg)  03/04/20 143 lb 9.6 oz (65.1 kg)  12/02/19 142 lb 9.6 oz (64.7 kg)     Other studies Reviewed: Additional studies/ records that were reviewed today include: echo images, EKG, office nots Review of the above records demonstrates: severe AS   Assessment and Plan:   1. Severe Aortic Valve Stenosis: She has severe, stage D3 (paradoxical low flow/low gradient) aortic valve stenosis. I have personally reviewed the echo images. The aortic valve is thickened, calcified with limited leaflet mobility. I think she would benefit from AVR. Given advanced age, she is not a good candidate for conventional AVR by surgical approach. I think she may be a good candidate for TAVR.   STS Risk Score: Risk of Mortality: 4.248% Renal Failure: 4.985% Permanent Stroke: 2.738% Prolonged Ventilation: 12.516% DSW Infection: 0.144% Reoperation: 4.304% Morbidity or Mortality: 22.581% Short Length of Stay: 19.903% Long Length of Stay: 9.165%  I have reviewed the natural history of aortic stenosis with the patient and their family members  who  are present today. We have discussed the limitations of medical therapy and the poor prognosis associated with symptomatic aortic stenosis. We have  reviewed potential treatment options, including palliative medical therapy, conventional surgical aortic valve replacement, and transcatheter aortic valve replacement. We discussed treatment options in the context of the patient's specific comorbid medical conditions.   She would like to proceed with planning for TAVR. I will arrange a right and left heart catheterization at [facility] 03/17/20. Risks and benefits of the cath procedure and the valve procedure are reviewed with the patient. After the cath, she will have a cardiac CT, CTA of the chest/abdomen and pelvis, carotid artery dopplers, PT assessment and will then be referred to see one of the CT surgeons on our TAVR team.      Current medicines are reviewed at length with the patient today.  The patient does not have concerns regarding medicines.  The following changes have been made:  no change  Labs/ tests ordered today include:   Orders Placed This Encounter  Procedures  . CT CORONARY MORPH W/CTA COR W/SCORE W/CA W/CM &/OR WO/CM  . CT ANGIO CHEST AORTA W/CM & OR WO/CM  . CT ANGIO ABDOMEN PELVIS  W &/OR WO CONTRAST  . Basic metabolic panel  . EKG 12-Lead   Disposition:   FU with the valve team   Signed, Lauree Chandler, MD 03/11/2020 11:04 AM    [facility] [address], [place], [place]  [address] Phone: [phone]; Fax: [phone]

## 2020-03-10 NOTE — Progress Notes (Signed)
Structural [facility] Clinic Consult Note  CC: New patient:  Aortic stenosis  History of Present Illness: 80 yo female with history of diabetes mellitus, CAD with prior PCI, HTN, HLD, breast cancer, PAD and severe aortic stenosis who is here today as a new consult, referred by Dr. Johnsie Cancel, for further discussion regarding her aortic stenosis and possible TAVR. She is known to have CAD with prior PCI and stenting of the RCA in 2007. Repeat cath in 2008 with patent RCA stent and no significant left system disease. Nuclear stress test in 2017 with no ischemia. She was seen recently by Dr. Johney Frame and c/o dyspnea with exertion. Echo 02/29/20 with LVEF of 65-70%, moderate LVH. Moderate mitral valve regurgitation. The aortic valve leaflets are thickened and calcified with limited leaflet mobility. Mean gradient 33 mmHg, peak gradient 48.7 mmHg, AVA 0.54 cm2, dimensionless index 0.26, SVI 34. This is consistent with paradoxical low flow/low gradient aortic stenosis. She has been followed in our office by [facility] and followed for moderate aortic stenosis for many years. She has had breast cancer with lumpectomy, radiation and chemotherapy in 2012/2013. She has PAD and has had PV intervention on the right leg per Dr. Carlis Abbott. She had trouble with sedation during that procedure.   She tells me today that she had progressive dyspnea with exertion and fatigue. She has rare sharp chest pains. She has ankle edema that is improved with Lasix. No dizziness or near syncope. She has upper dentures and no active issues with her lower teeth. Her daughter is Art therapist. She lives with her husband in [place]. She is retired as a Radiation protection practitioner. [place].   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   Dr [name]   Spinal stenosis    Thyroid disease     Past Surgical History:  Procedure Laterality Date   ABDOMINAL AORTOGRAM W/LOWER EXTREMITY Bilateral 04/29/2019   Procedure: ABDOMINAL AORTOGRAM W/LOWER EXTREMITY;  Surgeon: Marty Heck, MD;  Location: [facility];  Service: Cardiovascular;  Laterality: Bilateral;   ABDOMINAL AORTOGRAM W/LOWER EXTREMITY Left 07/01/2019   Procedure: ABDOMINAL AORTOGRAM W/ Left LOWER EXTREMITY [place];  Surgeon: Marty Heck, MD;  Location: [facility];  Service: Cardiovascular;  Laterality: Left;   ANTERIOR AND POSTERIOR REPAIR  N/A 04/05/2016   Procedure: ANTERIOR (CYSTOCELE) AND POSTERIOR REPAIR (RECTOCELE);  Surgeon: [name], MD;  Location: [facility];  Service: Gynecology;  Laterality: N/A;   BREAST BIOPSY     BREAST LUMPECTOMY  1983   benign biopsy   BREAST LUMPECTOMY Right 12/29/2010   snbx, ER/PR +, Her2 -, 0/1 node pos.   CARPAL TUNNEL RELEASE Bilateral 9/12,8,12   CHOLECYSTECTOMY     COLONOSCOPY N/A 02/09/2013   Procedure: COLONOSCOPY;  Surgeon: [name], MD;  [facility];  Service: Endoscopy;  Laterality:  N/A;   COLONOSCOPY     CORONARY ANGIOPLASTY     DILATION AND CURETTAGE OF UTERUS     ESOPHAGOGASTRODUODENOSCOPY N/A 02/09/2013   Procedure: ESOPHAGOGASTRODUODENOSCOPY (EGD);  Surgeon: [name], MD;  Location: [name];  Service: Endoscopy;  Laterality: N/A;   FOOT SPURS     HYSTEROSCOPY WITH D & C N/A 09/11/2012   Procedure: DILATATION AND CURETTAGE ;  Surgeon: [name], MD;  Location: [facility];  Service: Gynecology;  Laterality: N/A;   KNEE SURGERY  2007   LAPAROSCOPIC ASSISTED VAGINAL HYSTERECTOMY N/A 04/05/2016   Procedure: LAPAROSCOPIC ASSISTED VAGINAL HYSTERECTOMY possible BSO;  Surgeon: [name], MD;  Location: [facility];  Service: Gynecology;  Laterality: N/A;   LUMBAR LAMINECTOMY/DECOMPRESSION MICRODISCECTOMY N/A 10/20/2014   Procedure: MICRO LUMBER DECOMPRESSION L3-4 L4-5;  Surgeon: [name], MD;  [facility];  Service: Orthopedics;  Laterality: N/A;   LUMBAR LAMINECTOMY/DECOMPRESSION MICRODISCECTOMY Bilateral 10/13/2015   Procedure: MICRO LUMBAR DECOMPRESSION L5 - S1 AND REDO DECOMPRESSION L4 - L5 AND REMOVAL OF FACET CYST L4 - L5 2 LEVELS;  Surgeon: [name], MD;  [facility];  Service: Orthopedics;  Laterality: Bilateral;   PERIPHERAL VASCULAR INTERVENTION Right 04/29/2019   Procedure: PERIPHERAL VASCULAR INTERVENTION;  Surgeon: Marty Heck, MD;  Location: [facility];  Service: Cardiovascular;  Laterality: Right;   PORT-A-CATH REMOVAL  04/17/2011   Procedure: REMOVAL PORT-A-CATH;  Surgeon: [name], MD;  Location: [facility];  Service: General;  Laterality: Left;   PORTACATH PLACEMENT  02/07/2011   Procedure: INSERTION PORT-A-CATH;  Surgeon: [name], MD;  [facility];  Service: General;  Laterality: N/A;   UPPER GASTROINTESTINAL ENDOSCOPY      Current Outpatient Medications  Medication Sig Dispense Refill   Accu-Chek FastClix Lancets MISC USE TO CHECK BLOOD SUGARS 3 TIMES DAILY  306 each 3   acetaminophen (TYLENOL) 500 MG tablet Take 1,000 mg by mouth every 6 (six) hours as needed for moderate pain or headache.     aspirin EC 81 MG tablet Take 81 mg by mouth at bedtime.     calcium carbonate (TUMS - DOSED IN MG ELEMENTAL CALCIUM) 500 MG chewable tablet Chew 2 tablets by mouth 3 (three) times daily as needed for indigestion or heartburn.     cholecalciferol (VITAMIN D) 25 MCG (1000 UNIT) tablet Take 1,000 Units by mouth daily.     clobetasol cream (TEMOVATE) 8.41 % Apply 1 application topically 2 (two) times daily. 30 g 0   ferrous sulfate (FERROUSUL) 325 (65 FE) MG tablet Take 1 tablet (325 mg total) by mouth daily with breakfast. 30 tablet 3   furosemide (LASIX) 20 MG tablet Take 1 tablet (20 mg total) by mouth every Monday, Wednesday, and Friday. 90 tablet 3   glipiZIDE (GLUCOTROL XL) 10 MG 24 hr tablet Take 1 tablet (10 mg total) by mouth daily at 12 noon. 90 tablet 1   glucose blood (  ACCU-CHEK GUIDE) test strip TEST ONCE DAILY 100 each 5   Golimumab (SIMPONI ARIA IV) Inject 1 Dose into the vein See admin instructions. Every 2 months.     levothyroxine (SYNTHROID) 88 MCG tablet Take 1 tablet (88 mcg total) by mouth daily. 90 tablet 3   losartan (COZAAR) 50 MG tablet Take 1 tablet (50 mg total) by mouth daily. 90 tablet 0   melatonin 3 MG TABS tablet Take 3 mg by mouth at bedtime.     metFORMIN (GLUCOPHAGE) 500 MG tablet Take 1 tablet (500 mg total) by mouth daily with breakfast. 90 tablet 3   nitroGLYCERIN (NITROSTAT) 0.4 MG SL tablet Place 1 tablet (0.4 mg total) under the tongue every 5 (five) minutes as needed for chest pain. 25 tablet 3   Omega-3 Fatty Acids (FISH OIL) 1000 MG CAPS Take 1,000 mg by mouth in the morning and at bedtime.     pantoprazole (PROTONIX) 40 MG tablet Take 1 tablet (40 mg total) by mouth daily. 90 tablet 3   PARoxetine (PAXIL) 20 MG tablet Take 1 tablet (20 mg total) by mouth in the morning. 90 tablet 3   pioglitazone  (ACTOS) 30 MG tablet Take 1 tablet (30 mg total) by mouth daily. 90 tablet 1   sitaGLIPtin (JANUVIA) 100 MG tablet Take 1 tablet (100 mg total) by mouth daily. 90 tablet 3   valACYclovir (VALTREX) 1000 MG tablet TAKE 1 TABLET BY MOUTH 2  TIMES DAILY AS NEEDED FOR  VIRAL INFECTION. 180 tablet 0   vitamin B-12 (CYANOCOBALAMIN) 1000 MCG tablet Take 1,000 mcg by mouth daily.     No current facility-administered medications for this visit.    Allergies  Allergen Reactions   Codeine Nausea And Vomiting and Other (See Comments)    Severe stomach cramps   Antihistamines, Diphenhydramine-Type Other (See Comments)    Causes hyperactivity   Gabapentin     Urinary incontinence   Statins Other (See Comments)    Joint pains   Sulfa Antibiotics     Unknown to pt   Erythromycin Hives and Rash    Due to dental work about 50 years ago. Was used in packing and resulted in rash/hives inside and outside of mouth.    Social History   Socioeconomic History   Marital status: Married    Spouse name: Not on file   Number of children: 3   Years of education: Not on file   Highest education level: Not on file  Occupational History   Occupation: retired-bookkeeping  Tobacco Use   Smoking status: Former Smoker    Packs/day: 2.00    Years: 40.00    Pack years: 80.00    Quit date: 12/19/1997    Years since quitting: 22.2   Smokeless tobacco: Never Used  Vaping Use   Vaping Use: Never used  Substance and Sexual Activity   Alcohol use: Yes    Alcohol/week: 1.0 standard drink    Types: 1 Glasses of wine per week    Comment: occasional   Drug use: No   Sexual activity: Not on file    Comment: menarch age 56, P45, HRT X 10 YRS, MENOPAUSE MID 40'S  Other Topics Concern   Not on file  Social History Narrative   [place] age 17- [place] age 17  [place]   Social Determinants of Radio broadcast assistant Strain: Low Risk    Difficulty of Paying Living Expenses:  Not hard at all  Food Insecurity:  No Food Insecurity   Worried About Charity fundraiser in the Last Year: Never true   Ran Out of Food in the Last Year: Never true  Transportation Needs: No Transportation Needs   Lack of Transportation (Medical): No   Lack of Transportation (Non-Medical): No  Physical Activity: Inactive   Days of Exercise per Week: 0 days   Minutes of Exercise per Session: 0 min  Stress: No Stress Concern Present   Feeling of Stress : Not at all  Social Connections: Socially Integrated   Frequency of Communication with Friends and Family: More than three times a week   Frequency of Social Gatherings with Friends and Family: More than three times a week   Attends Religious Services: More than 4 times per year   Active Member of [employer] or Organizations: Yes   Attends Music therapist: More than 4 times per year   Marital Status: Married  Human resources officer Violence: Not At Risk   Fear of Current or Ex-Partner: No   Emotionally Abused: No   Physically Abused: No   Sexually Abused: No    Family History  Problem Relation Age of Onset   Throat cancer Father    Alcoholism Father    Heart attack Father    Heart disease Mother    Lymphoma Brother 70   Cancer Son    Heart attack Brother    Diabetes Brother    Hypertension Brother    Colon cancer Neg Hx    Stroke Neg Hx    Esophageal cancer Neg Hx    Rectal cancer Neg Hx    Stomach cancer Neg Hx     Review of Systems:  As stated in the HPI and otherwise negative.   BP 132/70    Pulse 74    Ht '5\' 3"'  (1.6 m)    Wt 142 lb 9.6 oz (64.7 kg)    SpO2 99%    BMI 25.26 kg/m   Physical Examination: General: Well developed, well nourished, NAD  HEENT: OP clear, mucus membranes moist  SKIN: warm, dry. No rashes. Neuro: No focal deficits  Musculoskeletal: Muscle strength 5/5 all ext  Psychiatric: Mood and affect normal  Neck: No JVD, no carotid bruits, no thyromegaly, no  lymphadenopathy.  Lungs:Clear bilaterally, no wheezes, rhonci, crackles Cardiovascular: Regular rate and rhythm. Loud, harsh, late peaking systolic murmur.  Abdomen:Soft. Bowel sounds present. Non-tender.  Extremities: No lower extremity edema. Pulses are 2 + in the bilateral DP/PT.  EKG:  EKG is ordered today. The ekg ordered today demonstrates sinus, diffuse chronic ST and T wave abn  Echo 02/29/20:  1. The AoV is not well visualized but appears severely calcified with  restricted leaflet motion. Vmax 3.5 m/s, MG 33 mmHG, AVA 0.66 cm2, DI  0.26. The LV cavity is small and SVi = 34 cc/m2. Finding could represent  low flow low gradient severe AS, and  would recommend either TEE or aortic valve calcium score for clarification  if clinically indicated. The aortic valve is calcified. There is severe  calcifcation of the aortic valve. There is severe thickening of the aortic  valve. Aortic valve  regurgitation is not visualized. Moderate to severe aortic valve stenosis.  2. MR is now moderate and likely worsened in the setting of AS  progression. The mitral valve is grossly normal. Moderate mitral valve  regurgitation. No evidence of mitral stenosis.  3. Left ventricular ejection fraction, by estimation, is 65 to 70%. The  left ventricle  has normal function. The left ventricle has no regional  wall motion abnormalities. There is moderate concentric left ventricular  hypertrophy. Left ventricular  diastolic function could not be evaluated.  4. Right ventricular systolic function is normal. The right ventricular  size is normal. Tricuspid regurgitation signal is inadequate for assessing  PA pressure.  5. The inferior vena cava is normal in size with greater than 50%  respiratory variability, suggesting right atrial pressure of 3 mmHg.   Comparison(s): Changes from prior study are noted. Moderate to severe AS  is now present. Moderate MR is now present. LV function remains similar.    FINDINGS  Left Ventricle: Left ventricular ejection fraction, by estimation, is 65  to 70%. The left ventricle has normal function. The left ventricle has no  regional wall motion abnormalities. The left ventricular internal cavity  size was normal in size. There is  moderate concentric left ventricular hypertrophy. Left ventricular  diastolic function could not be evaluated due to mitral regurgitation  (moderate or greater). Left ventricular diastolic function could not be  evaluated.   Right Ventricle: The right ventricular size is normal. No increase in  right ventricular wall thickness. Right ventricular systolic function is  normal. Tricuspid regurgitation signal is inadequate for assessing PA  pressure.   Left Atrium: Left atrial size was normal in size.   Right Atrium: Right atrial size was normal in size.   Pericardium: Trivial pericardial effusion is present. Presence of  pericardial fat pad.   Mitral Valve: MR is now moderate and likely worsened in the setting of AS  progression. The mitral valve is grossly normal. Mild mitral annular  calcification. Moderate mitral valve regurgitation. No evidence of mitral  valve stenosis.   Tricuspid Valve: The tricuspid valve is grossly normal. Tricuspid valve  regurgitation is trivial. No evidence of tricuspid stenosis.   Aortic Valve: The AoV is not well visualized but appears severely  calcified with restricted leaflet motion. Vmax 3.5 m/s, MG 33 mmHG, AVA  0.66 cm2, DI 0.26. The LV cavity is small and SVi = 34 cc/m2. Finding  could represent low flow low gradient severe  AS, and would recommend either TEE or aortic valve calcium score for  clarification if clinically indicated. The aortic valve is calcified.  There is severe calcifcation of the aortic valve. There is severe  thickening of the aortic valve. Aortic valve  regurgitation is not visualized. Moderate to severe aortic stenosis is  present. Aortic valve mean  gradient measures 33.0 mmHg. Aortic valve peak  gradient measures 48.7 mmHg. Aortic valve area, by VTI measures 0.66 cm.   Pulmonic Valve: The pulmonic valve was grossly normal. Pulmonic valve  regurgitation is not visualized. No evidence of pulmonic stenosis.   Aorta: The aortic root and ascending aorta are structurally normal, with  no evidence of dilitation.   Venous: The inferior vena cava is normal in size with greater than 50%  respiratory variability, suggesting right atrial pressure of 3 mmHg.   IAS/Shunts: The atrial septum is grossly normal.     LEFT VENTRICLE  PLAX 2D  LVIDd:     4.10 cm Diastology  LVIDs:     2.60 cm LV e' medial:  5.55 cm/s  LV PW:     1.40 cm LV E/e' medial: 27.4  LV IVS:    1.38 cm LV e' lateral:  4.24 cm/s  LVOT diam:   1.81 cm LV E/e' lateral: 35.8  LV SV:     57  LV  SV Index:  34  LVOT Area:   2.57 cm     RIGHT VENTRICLE  RV Basal diam: 2.30 cm  RV S prime:   9.57 cm/s  TAPSE (M-mode): 2.3 cm   LEFT ATRIUM       Index    RIGHT ATRIUM     Index  LA diam:    3.30 cm 1.97 cm/m RA Area:   8.07 cm  LA Vol (A2C):  35.9 ml 21.44 ml/m RA Volume:  14.80 ml 8.84 ml/m  LA Vol (A4C):  36.7 ml 21.91 ml/m  LA Biplane Vol: 36.1 ml 21.56 ml/m  AORTIC VALVE  AV Area (Vmax):  0.61 cm  AV Area (Vmean):  0.54 cm  AV Area (VTI):   0.66 cm  AV Vmax:      349.00 cm/s  AV Vmean:     265.000 cm/s  AV VTI:      0.864 m  AV Peak Grad:   48.7 mmHg  AV Mean Grad:   33.0 mmHg  LVOT Vmax:     82.50 cm/s  LVOT Vmean:    55.700 cm/s  LVOT VTI:     0.223 m  LVOT/AV VTI ratio: 0.26    AORTA  Ao Root diam: 3.20 cm  Ao Asc diam: 2.90 cm   MITRAL VALVE  MV Area (PHT):        SHUNTS  MV Decel Time:        Systemic VTI: 0.22 m  MR Peak grad:  172.9 mmHg Systemic Diam: 1.81 cm  MR Mean grad:  108.5 mmHg  MR Vmax:     657.50  cm/s  MR Vmean:    477.5 cm/s  MR PISA:     2.65 cm  MR PISA Eff ROA: 12 mm  MR PISA Radius: 0.65 cm  MV E velocity: 152.00 cm/s  MV A velocity: 168.00 cm/s  MV E/A ratio: 0.90   Recent Labs: 04/29/2019: ALT 18 06/23/2019: TSH 2.44 02/26/2020: Hemoglobin 12.3; Platelets 222 03/04/2020: BUN 35; Creatinine, Ser 1.83; Magnesium 1.5; NT-Pro BNP 897; Potassium 4.2; Sodium 134   Lipid Panel    Component Value Date/Time   CHOL 189 06/24/2019 1416   TRIG 292.0 (H) 06/24/2019 1416   HDL 29.60 (L) 06/24/2019 1416   CHOLHDL 6 06/24/2019 1416   VLDL 58.4 (H) 06/24/2019 1416   LDLCALC 83 05/13/2008 0929   LDLDIRECT 101.0 06/24/2019 1416     Wt Readings from Last 3 Encounters:  03/11/20 142 lb 9.6 oz (64.7 kg)  03/04/20 143 lb 9.6 oz (65.1 kg)  12/02/19 142 lb 9.6 oz (64.7 kg)     Other studies Reviewed: Additional studies/ records that were reviewed today include: echo images, EKG, office nots Review of the above records demonstrates: severe AS   Assessment and Plan:   1. Severe Aortic Valve Stenosis: She has severe, stage D3 (paradoxical low flow/low gradient) aortic valve stenosis. I have personally reviewed the echo images. The aortic valve is thickened, calcified with limited leaflet mobility. I think she would benefit from AVR. Given advanced age, she is not a good candidate for conventional AVR by surgical approach. I think she may be a good candidate for TAVR.   STS Risk Score: Risk of Mortality: 4.248% Renal Failure: 4.985% Permanent Stroke: 2.738% Prolonged Ventilation: 12.516% DSW Infection: 0.144% Reoperation: 4.304% Morbidity or Mortality: 22.581% Short Length of Stay: 19.903% Long Length of Stay: 9.165%  I have reviewed the natural history of aortic stenosis with the patient and  their family members  who are present today. We have discussed the limitations of medical therapy and the poor prognosis associated with symptomatic aortic stenosis. We have  reviewed potential treatment options, including palliative medical therapy, conventional surgical aortic valve replacement, and transcatheter aortic valve replacement. We discussed treatment options in the context of the patient's specific comorbid medical conditions.   She would like to proceed with planning for TAVR. I will arrange a right and left heart catheterization at [facility] 03/17/20. Risks and benefits of the cath procedure and the valve procedure are reviewed with the patient. After the cath, she will have a cardiac CT, CTA of the chest/abdomen and pelvis, carotid artery dopplers, PT assessment and will then be referred to see one of the CT surgeons on our TAVR team.      Current medicines are reviewed at length with the patient today.  The patient does not have concerns regarding medicines.  The following changes have been made:  no change  Labs/ tests ordered today include:   Orders Placed This Encounter  Procedures   CT CORONARY MORPH W/CTA COR W/SCORE W/CA W/CM &/OR WO/CM   CT ANGIO CHEST AORTA W/CM & OR WO/CM   CT ANGIO ABDOMEN PELVIS  W &/OR WO CONTRAST   Basic metabolic panel   EKG 67-SWVT   Disposition:   FU with the valve team   Signed, Lauree Chandler, MD 03/11/2020 11:04 AM    [facility] [address], [place], [place]  [address] Phone: [phone]; Fax: [phone]

## 2020-03-11 ENCOUNTER — Ambulatory Visit (INDEPENDENT_AMBULATORY_CARE_PROVIDER_SITE_OTHER): Payer: Medicare Other | Admitting: Cardiovascular Disease

## 2020-03-11 ENCOUNTER — Encounter: Payer: Self-pay | Admitting: Cardiovascular Disease

## 2020-03-11 ENCOUNTER — Other Ambulatory Visit: Payer: Self-pay

## 2020-03-11 VITALS — BP 132/70 | HR 74 | Ht 63.0 in | Wt 142.6 lb

## 2020-03-11 DIAGNOSIS — I350 Nonrheumatic aortic (valve) stenosis: Secondary | ICD-10-CM | POA: Diagnosis not present

## 2020-03-11 LAB — BASIC METABOLIC PANEL
BUN/Creatinine Ratio: 17 (ref 12–28)
BUN: 30 mg/dL — ABNORMAL HIGH (ref 8–27)
CO2: 23 mmol/L (ref 20–29)
Calcium: 9.6 mg/dL (ref 8.7–10.3)
Chloride: 97 mmol/L (ref 96–106)
Creatinine, Ser: 1.76 mg/dL — ABNORMAL HIGH (ref 0.57–1.00)
GFR calc Af Amer: 32 mL/min/{1.73_m2} — ABNORMAL LOW (ref >59)
GFR calc non Af Amer: 28 mL/min/{1.73_m2} — ABNORMAL LOW (ref >59)
Glucose: 156 mg/dL — ABNORMAL HIGH (ref 65–99)
Potassium: 4.2 mmol/L (ref 3.5–5.2)
Sodium: 138 mmol/L (ref 134–144)

## 2020-03-11 NOTE — Patient Instructions (Addendum)
COVID SCREENING INFORMATION (2/22): You are scheduled for your drive-thru COVID screening on: 03/15/20 between 8:30AM and 9:30AM. Pre-Procedural COVID-19 Testing Site [address] [place], [place] [address] You will need to go home after your screening and quarantine until your procedure.   CATHETERIZATION INFORMATION (2/24): You are scheduled for a Cardiac Catheterization on Thursday, March 17, 2020 with Dr. Lauree Chandler.  1. Please arrive at the [facility] (Main Entrance A) at [facility]: [address] [place], [place] [address] at: 6:30AM. Free valet parking service is available. You are allowed ONE visitor in the waiting room during your procedure. Both you and your guest must wear masks. Special note: Every effort is made to have your procedure done on time. Please understand that emergencies sometimes delay scheduled procedures.  2. Diet: Do not eat solid foods after midnight.  You may have clear liquids until 5am upon the day of the procedure.  3. Labs: TODAY!  4. Medication instructions in preparation for your procedure:  1) HOLD LASIX the day before and day of your cath  2) HOLD LOSARTAN the day before and morning of your cath  3) HOLD METFORMIN the morning of AND 48 hours after your procedure  4) HOLD GLIPIZIDE the morning of AND 48 hours after your procedure    5) HOLD JANUVIA the morning of your cath   6) MAKE SURE TO TAKE YOUR ASPIRIN the AM of your cath  7) You may take your other medications as directed with sips of water   5. Plan for one night stay--bring personal belongings. 6. Bring a current list of your medications and current insurance cards. 7. You MUST have a responsible person to drive you home. 8. Someone MUST be with you the first 24 hours after you arrive home or your discharge will be delayed. 9. Please wear clothes that are easy to get on and off and wear slip-on shoes.  Thank you for allowing [place] to care for you!   -- [place]  Invasive Cardiovascular services

## 2020-03-15 ENCOUNTER — Other Ambulatory Visit (HOSPITAL_COMMUNITY)
Admission: RE | Admit: 2020-03-15 | Discharge: 2020-03-15 | Disposition: A | Discharge status: Home / Self Care | Payer: Medicare Other | Source: Ambulatory Visit | Attending: Cardiovascular Disease | Admitting: Cardiovascular Disease

## 2020-03-15 ENCOUNTER — Ambulatory Visit: Payer: Medicare Other | Admitting: Cardiovascular Disease

## 2020-03-15 ENCOUNTER — Other Ambulatory Visit: Payer: Medicare Other

## 2020-03-15 DIAGNOSIS — Z01812 Encounter for preprocedural laboratory examination: Secondary | ICD-10-CM | POA: Insufficient documentation

## 2020-03-15 DIAGNOSIS — Z20822 Contact with and (suspected) exposure to covid-19: Secondary | ICD-10-CM | POA: Diagnosis not present

## 2020-03-15 LAB — SARS CORONAVIRUS 2 (TAT 6-24 HRS): SARS Coronavirus 2: NEGATIVE

## 2020-03-16 ENCOUNTER — Telehealth: Payer: Self-pay | Admitting: *Deleted

## 2020-03-16 NOTE — Progress Notes (Deleted)
Cardiology Office Note:    Date:  03/16/2020   ID:  [name], [name] [birth year], MRN [id number]  PCP:  [name] Nian, DO  [facility] Cardiologist:  [name] Rouge, MD  [facility] Electrophysiologist:  None   Referring MD: [name] Nian, DO     History of Present Illness:    [name] is a 80 y.o. female with a hx of CAD status post PCI RCA  2007, hypertension, DM, hyperlipidemia, history of breast cancer, PAD and severe aortic stenosis who presents to clinic for follow-up of severe AS.  Called office January with increasing dyspnea Echo with progressive AS   02/29/20 TTE which revealed severe AS Vmax 3.5 m/s, MG 33 mmHG, AVA 0.66 cm2, DI 0.26. The  SVi = 34 cc/m2. Seen by  [name] 03/11/20 and set up for cath and consideration of TAVR    Cath 03/17/20 reviewed ***  She has upper dentures and no active issues with her lower teeth. Her daughter is [name]. She lives with her husband in [place]. She is retired as a Radiation protection practitioner. [place].   Past Medical History:  Diagnosis Date  . Allergy   . Anemia    childhood  . Anxiety   . Aortic stenosis   . Arterial stenosis (HCC)    mesenteric  . Arthritis   . Breast cancer ([place]) 12/05/10   R breast, inv mammary, in situ,ER/PR +,HER2 -  . Cancer ([place])   . Carcinoma of breast treated with adjuvant chemotherapy ([place])   . Coronary artery disease    stent 2007  . Depression   . Diabetes mellitus   . Difficulty sleeping   . Diverticulosis 12/10/03  . Elevated cholesterol   . Generalized weakness   . GERD (gastroesophageal reflux disease)   . Heart murmur    secondary to chem. 2013  . Hepatitis    as an infant  . HSV-1 (herpes simplex virus 1) infection   . Hyperlipidemia   . Hypertension   . Hypothyroidism   . Osteoarthritis   . Personal history of chemotherapy   . Personal history of radiation therapy   . Pneumonia    2014 september  .  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   [name] [name]  . Spinal stenosis   . Thyroid disease     Past Surgical History:  Procedure Laterality Date  . ABDOMINAL AORTOGRAM W/LOWER EXTREMITY Bilateral 04/29/2019   Procedure: ABDOMINAL AORTOGRAM W/LOWER EXTREMITY;  Surgeon: [name] Heck, MD;  Location: [facility];  Service: Cardiovascular;  Laterality: Bilateral;  . ABDOMINAL AORTOGRAM W/LOWER EXTREMITY Left 07/01/2019   Procedure: ABDOMINAL AORTOGRAM W/ Left LOWER EXTREMITY Run[place];  Surgeon: [name] Heck, MD;  Location: [facility];  Service: Cardiovascular;  Laterality: Left;  . ANTERIOR AND POSTERIOR REPAIR N/A 04/05/2016   Procedure: ANTERIOR (CYSTOCELE) AND POSTERIOR REPAIR (RECTOCELE);  Surgeon: [name] Pearson, MD;  Location: [facility];  Service: Gynecology;  Laterality: N/A;  . BREAST BIOPSY    . BREAST LUMPECTOMY  1983   benign biopsy  . BREAST LUMPECTOMY Right 12/29/2010   snbx, ER/PR +, Her2 -, 0/1 node pos.  . CARPAL TUNNEL RELEASE Bilateral 9/12,8,12  . CHOLECYSTECTOMY    . COLONOSCOPY N/A 02/09/2013   Procedure: COLONOSCOPY;  Surgeon: [name] Dragon, MD;  Location: [facility];  Service: Endoscopy;  Laterality: N/A;  . COLONOSCOPY    . CORONARY ANGIOPLASTY    .  DILATION AND CURETTAGE OF UTERUS    . ESOPHAGOGASTRODUODENOSCOPY N/A 02/09/2013   Procedure: ESOPHAGOGASTRODUODENOSCOPY (EGD);  Surgeon: [name] Dragon, MD;  Location: [name];  Service: Endoscopy;  Laterality: N/A;  . FOOT SPURS    . HYSTEROSCOPY WITH D & C N/A 09/11/2012   Procedure: DILATATION AND CURETTAGE ;  Surgeon: [name] Pearson, MD;  Location: [facility];  Service: Gynecology;  Laterality: N/A;  . KNEE SURGERY  2007  . LAPAROSCOPIC ASSISTED VAGINAL HYSTERECTOMY N/A 04/05/2016   Procedure: LAPAROSCOPIC ASSISTED VAGINAL HYSTERECTOMY possible BSO;  Surgeon: [name] Pearson, MD;  Location: [facility];  Service: Gynecology;  Laterality: N/A;  . LUMBAR LAMINECTOMY/DECOMPRESSION MICRODISCECTOMY N/A  10/20/2014   Procedure: MICRO LUMBER DECOMPRESSION L3-4 L4-5;  Surgeon: [name] Day, MD;  Location: [facility];  Service: Orthopedics;  Laterality: N/A;  . LUMBAR LAMINECTOMY/DECOMPRESSION MICRODISCECTOMY Bilateral 10/13/2015   Procedure: MICRO LUMBAR DECOMPRESSION L5 - S1 AND REDO DECOMPRESSION L4 - L5 AND REMOVAL OF FACET CYST L4 - L5 2 LEVELS;  Surgeon: [name] Day, MD;  Location: [facility];  Service: Orthopedics;  Laterality: Bilateral;  . PERIPHERAL VASCULAR INTERVENTION Right 04/29/2019   Procedure: PERIPHERAL VASCULAR INTERVENTION;  Surgeon: [name] Heck, MD;  Location: [facility];  Service: Cardiovascular;  Laterality: Right;  . PORT-A-CATH REMOVAL  04/17/2011   Procedure: REMOVAL PORT-A-CATH;  Surgeon: [name] Bookbinder, MD;  Location: [facility];  Service: General;  Laterality: Left;  . PORTACATH PLACEMENT  02/07/2011   Procedure: INSERTION PORT-A-CATH;  Surgeon: [name] Bookbinder, MD;  Location: [facility];  Service: General;  Laterality: N/A;  . UPPER GASTROINTESTINAL ENDOSCOPY      Current Medications: No outpatient medications have been marked as taking for the 03/21/20 encounter (Appointment) with [name] Hector, MD.     Allergies:   Codeine; Antihistamines, diphenhydramine-type; Gabapentin; Statins; Sulfa antibiotics; and Erythromycin   Social History   Socioeconomic History  . Marital status: Married    Spouse name: Not on file  . Number of children: 3  . Years of education: Not on file  . Highest education level: Not on file  Occupational History  . Occupation: retired-bookkeeping  Tobacco Use  . Smoking status: Former Smoker    Packs/[name]: 2.00    Years: 40.00    Pack years: 80.00    Quit date: 12/19/1997    Years since quitting: 22.2  . Smokeless tobacco: Never Used  Vaping Use  . Vaping Use: Never used  Substance and Sexual Activity  . Alcohol use: Yes    Alcohol/week: 1.0 standard drink    Types: 1 Glasses of wine per week    Comment:  occasional  . Drug use: No  . Sexual activity: Not on file    Comment: menarch age 68, P3, HRT X 10 YRS, MENOPAUSE MID 40'S  Other Topics Concern  . Not on file  Social History Narrative   [name] age 72- [place] age 46  [place]   Social Determinants of Health   Financial Resource Strain: Low Risk   . Difficulty of Paying Living Expenses: Not hard at all  Food Insecurity: No Food Insecurity  . Worried About Charity fundraiser in the Last Year: Never true  . Ran Out of Food in the Last Year: Never true  Transportation Needs: No Transportation Needs  . Lack of Transportation (Medical): No  . Lack of Transportation (Non-Medical): No  Physical Activity: Inactive  . Days of Exercise per Week: 0 days  . Minutes of  Exercise per Session: 0 min  Stress: No Stress Concern Present  . Feeling of Stress : Not at all  Social Connections: Socially Integrated  . Frequency of Communication with Friends and Family: More than three times a week  . Frequency of Social Gatherings with Friends and Family: More than three times a week  . Attends Religious Services: More than 4 times per year  . Active Member of Clubs or Organizations: Yes  . Attends Archivist Meetings: More than 4 times per year  . Marital Status: Married     Family History: The patient's family history includes Alcoholism in her father; Cancer in her son; Diabetes in her brother; Heart attack in her brother and father; Heart disease in her mother; Hypertension in her brother; Lymphoma (age of onset: 75) in her brother; Throat cancer in her father. There is no history of Colon cancer, Stroke, Esophageal cancer, Rectal cancer, or Stomach cancer.  ROS:   Please see the history of present illness.    Review of Systems  Constitutional: Positive for malaise/fatigue. Negative for chills and fever.  HENT: Negative for nosebleeds.   Eyes: Negative for blurred vision and redness.  Respiratory: Positive for  shortness of breath.   Cardiovascular: Positive for chest pain. Negative for palpitations, orthopnea, claudication, leg swelling and PND.  Gastrointestinal: Negative for melena, nausea and vomiting.  Genitourinary: Negative for hematuria.  Musculoskeletal: Positive for joint pain and myalgias. Negative for falls.  Neurological: Negative for dizziness and loss of consciousness.  Endo/Heme/Allergies: Negative for polydipsia.  Psychiatric/Behavioral: Negative for substance abuse.    EKGs/Labs/Other Studies Reviewed:    The following studies were reviewed today: TTE 03-01-2020: IMPRESSIONS  1. The AoV is not well visualized but appears severely calcified with  restricted leaflet motion. Vmax 3.5 m/s, MG 33 mmHG, AVA 0.66 cm2, DI  0.26. The LV cavity is small and SVi = 34 cc/m2. Finding could represent  low flow low gradient severe AS, and  would recommend either TEE or aortic valve calcium score for clarification  if clinically indicated. The aortic valve is calcified. There is severe  calcifcation of the aortic valve. There is severe thickening of the aortic  valve. Aortic valve  regurgitation is not visualized. Moderate to severe aortic valve stenosis.  2. MR is now moderate and likely worsened in the setting of AS  progression. The mitral valve is grossly normal. Moderate mitral valve  regurgitation. No evidence of mitral stenosis.  3. Left ventricular ejection fraction, by estimation, is 65 to 70%. The  left ventricle has normal function. The left ventricle has no regional  wall motion abnormalities. There is moderate concentric left ventricular  hypertrophy. Left ventricular  diastolic function could not be evaluated.  4. Right ventricular systolic function is normal. The right ventricular  size is normal. Tricuspid regurgitation signal is inadequate for assessing  PA pressure.  5. The inferior vena cava is normal in size with greater than 50%  respiratory variability,  suggesting right atrial pressure of 3 mmHg.  Abdominal aortogram 06/2019: Aortogram shows a diseased distal infrarenal aorta with approximate 50% stenosis from calcification but the iliacs themselves appear patent. She does have excellent left femoral pulse distally.  Left common femoral and profunda are widely patent and the SFA is moderately diseased throughout its course without any flow-limiting stenosis. Approximate 50% stenosis at Hunter's canal. Approximate 50% stenosis at popliteal artery behind the knee. Patient has single-vessel runoff via peroneal artery with a greater than 60 to 70%  stenosis in the TP trunk as well as at least 3 stenosis high-grade in the distal peroneal artery. Even though she has single-vessel peroneal runoff she has very brisk inflow down the left lower extremity with filling of the distal AT and dorsalis pedis at the ankle through a collateral.  Ultimately after evaluating the pictures,I elected not to intervene given single-vessel peroneal runoff in the setting of claudication. Discussed with the patient I would let her symptoms progress before potentially intervening on single-vessel peroneal runoff given her symptoms at this time and no evidence of critical limb ischemia.  Myoview 2017:   Nuclear stress EF: 59%. Normal wall motion  There was no ST segment deviation noted during stress.  This is a low risk study. No perfusion defects, no ischemia  Recent Labs: 04/29/2019: ALT 18 06/23/2019: TSH 2.44 02/26/2020: Hemoglobin 12.3; Platelets 222 03/04/2020: Magnesium 1.5; NT-Pro BNP 897 03/11/2020: BUN 30; Creatinine, Ser 1.76; Potassium 4.2; Sodium 138  Recent Lipid Panel    Component Value Date/Time   CHOL 189 06/24/2019 1416   TRIG 292.0 (H) 06/24/2019 1416   HDL 29.60 (L) 06/24/2019 1416   CHOLHDL 6 06/24/2019 1416   VLDL 58.4 (H) 06/24/2019 1416   LDLCALC 83 05/13/2008 0929   LDLDIRECT 101.0 06/24/2019 1416     Physical Exam:    VS:  There  were no vitals taken for this visit.    Wt Readings from Last 3 Encounters:  03/11/20 64.7 kg  03/04/20 65.1 kg  12/02/19 64.7 kg    Affect appropriate Healthy:  appears stated age 54: normal Neck supple with no adenopathy JVP normal no bruits no thyromegaly Lungs clear with no wheezing and good diaphragmatic motion Heart:  S1/S2 diminished AS  murmur, no rub, gallop or click PMI normal Abdomen: benighn, BS positve, no tenderness, no AAA no bruit.  No HSM or HJR Distal pulses intact with no bruits No edema Neuro non-focal Skin warm and dry No muscular weakness   ASSESSMENT:    No diagnosis found. PLAN:    In order of problems listed above:  #Severe, symptomatic aortic stenosis: Patient with known history of AS that has now progressed to severe. Vmax 3.5 m/s, MG 33 mmHG, AVA 0.66 cm2, DI 0.26. The  SVi = 34 cc/m2. Symptomatic with worsening dyspnea on exertion, mild chest pain on exertion, and recent HF exacerbation with BNP 1501. Given age and previous chest XRT for breast cancer better candidate for TAVR ***  #Acute on chronic diastolic heart failure: Improved with lasix BNP->1501 to 897   #Peripheral vascular disease s/p right SFA stent: -Continue ASA  -Intolerant to statins - f/u LE ABI's duplex will have abdomen/runn off for TAVR scan  #CAD s/p PCI to RCA: -Continue ASA - Pre TAVR cath ***  #DMII: -On insulin, metformin, glipizide  #HTN: -Continue losartan  ***   Signed, [name] Rouge, MD  03/16/2020 10:50 AM    [name]

## 2020-03-16 NOTE — Telephone Encounter (Signed)
Pt contacted pre-catheterization scheduled at [facility] for: Thursday March 17, 2020 11:30 AM Verified arrival time and place: [place] [facility]) at: 6:30 AM pre-procedure hydration   No solid food after midnight prior to cath, clear liquids until 5 AM day of procedure.  Hold: Lasix-day before and day of procedure -GFR 28 Losartan -day before and day of procedure -GFR 28 Januvia-AM of procedure  Glipizide-AM of procedure  Actos-AM of procedure Metformin -day of procedure and 48 hours post procedure  Except hold medications AM meds can be  taken pre-cath with sips of water including: ASA 81 mg   Confirmed patient has responsible adult to drive home post procedure and be with patient first 24 hours after arriving home: yes  You are allowed ONE visitor in the waiting room during the time you are at the hospital for your procedure. Both you and your visitor must wear a mask once you enter the hospital.    Reviewed procedure/mask/visitor instructions with patient.

## 2020-03-17 ENCOUNTER — Encounter (HOSPITAL_COMMUNITY)
Admission: RE | Disposition: A | Discharge status: Home / Self Care | Payer: Self-pay | Source: Home / Self Care | Attending: Cardiovascular Disease

## 2020-03-17 ENCOUNTER — Telehealth: Payer: Self-pay

## 2020-03-17 ENCOUNTER — Other Ambulatory Visit: Payer: Self-pay

## 2020-03-17 ENCOUNTER — Ambulatory Visit (HOSPITAL_COMMUNITY)
Admission: RE | Admit: 2020-03-17 | Discharge: 2020-03-17 | Disposition: A | Discharge status: Home / Self Care | Payer: Medicare Other | Attending: Cardiovascular Disease | Admitting: Cardiovascular Disease

## 2020-03-17 DIAGNOSIS — Z87891 Personal history of nicotine dependence: Secondary | ICD-10-CM | POA: Insufficient documentation

## 2020-03-17 DIAGNOSIS — I350 Nonrheumatic aortic (valve) stenosis: Secondary | ICD-10-CM | POA: Diagnosis not present

## 2020-03-17 DIAGNOSIS — I25118 Atherosclerotic heart disease of native coronary artery with other forms of angina pectoris: Secondary | ICD-10-CM

## 2020-03-17 DIAGNOSIS — Z888 Allergy status to other drugs, medicaments and biological substances status: Secondary | ICD-10-CM | POA: Insufficient documentation

## 2020-03-17 DIAGNOSIS — Z7982 Long term (current) use of aspirin: Secondary | ICD-10-CM | POA: Insufficient documentation

## 2020-03-17 DIAGNOSIS — Z882 Allergy status to sulfonamides status: Secondary | ICD-10-CM | POA: Diagnosis not present

## 2020-03-17 DIAGNOSIS — Z7984 Long term (current) use of oral hypoglycemic drugs: Secondary | ICD-10-CM | POA: Insufficient documentation

## 2020-03-17 DIAGNOSIS — Z79890 Hormone replacement therapy (postmenopausal): Secondary | ICD-10-CM | POA: Diagnosis not present

## 2020-03-17 DIAGNOSIS — Z79899 Other long term (current) drug therapy: Secondary | ICD-10-CM | POA: Insufficient documentation

## 2020-03-17 DIAGNOSIS — I2510 Atherosclerotic heart disease of native coronary artery without angina pectoris: Secondary | ICD-10-CM

## 2020-03-17 DIAGNOSIS — Z885 Allergy status to narcotic agent status: Secondary | ICD-10-CM | POA: Insufficient documentation

## 2020-03-17 HISTORY — PX: RIGHT/LEFT HEART CATH AND CORONARY ANGIOGRAPHY: CATH118266

## 2020-03-17 LAB — POCT I-STAT 7, (LYTES, BLD GAS, ICA,H+H)
Acid-base deficit: 1 mmol/L (ref 0.0–2.0)
Bicarbonate: 25 mmol/L (ref 20.0–28.0)
Calcium, Ion: 1.09 mmol/L — ABNORMAL LOW (ref 1.15–1.40)
HCT: 34 % — ABNORMAL LOW (ref 36.0–46.0)
Hemoglobin: 11.6 g/dL — ABNORMAL LOW (ref 12.0–15.0)
O2 Saturation: 99 %
Potassium: 3.8 mmol/L (ref 3.5–5.1)
Sodium: 142 mmol/L (ref 135–145)
TCO2: 26 mmol/L (ref 22–32)
pCO2 arterial: 43.9 mmHg (ref 32.0–48.0)
pH, Arterial: 7.365 (ref 7.350–7.450)
pO2, Arterial: 139 mmHg — ABNORMAL HIGH (ref 83.0–108.0)

## 2020-03-17 LAB — POCT I-STAT EG7
Acid-Base Excess: 1 mmol/L (ref 0.0–2.0)
Bicarbonate: 26.9 mmol/L (ref 20.0–28.0)
Calcium, Ion: 1.24 mmol/L (ref 1.15–1.40)
HCT: 38 % (ref 36.0–46.0)
Hemoglobin: 12.9 g/dL (ref 12.0–15.0)
O2 Saturation: 82 %
Potassium: 4.1 mmol/L (ref 3.5–5.1)
Sodium: 139 mmol/L (ref 135–145)
TCO2: 28 mmol/L (ref 22–32)
pCO2, Ven: 47 mmHg (ref 44.0–60.0)
pH, Ven: 7.365 (ref 7.250–7.430)
pO2, Ven: 48 mmHg — ABNORMAL HIGH (ref 32.0–45.0)

## 2020-03-17 LAB — GLUCOSE, CAPILLARY: Glucose-Capillary: 192 mg/dL — ABNORMAL HIGH (ref 70–99)

## 2020-03-17 SURGERY — RIGHT/LEFT HEART CATH AND CORONARY ANGIOGRAPHY
Anesthesia: LOCAL

## 2020-03-17 MED ORDER — HEPARIN (PORCINE) IN NACL 1000-0.9 UT/500ML-% IV SOLN
INTRAVENOUS | Status: AC
  Filled 2020-03-17: qty 500

## 2020-03-17 MED ORDER — MIDAZOLAM HCL 2 MG/2ML IJ SOLN
INTRAMUSCULAR | Status: DC | PRN
  Administered 2020-03-17: 1 mg via INTRAVENOUS

## 2020-03-17 MED ORDER — MIDAZOLAM HCL 2 MG/2ML IJ SOLN
INTRAMUSCULAR | Status: AC
  Filled 2020-03-17: qty 2

## 2020-03-17 MED ORDER — SODIUM CHLORIDE 0.9% FLUSH: 3.0000 mL | Freq: Two times a day (BID) | INTRAVENOUS | Status: DC

## 2020-03-17 MED ORDER — HEPARIN SODIUM (PORCINE) 1000 UNIT/ML IJ SOLN
INTRAMUSCULAR | Status: DC | PRN
  Administered 2020-03-17: 3500 [IU] via INTRAVENOUS

## 2020-03-17 MED ORDER — SODIUM CHLORIDE 0.9 % IV SOLN: INTRAVENOUS | Status: AC

## 2020-03-17 MED ORDER — HYDRALAZINE HCL 20 MG/ML IJ SOLN: 10.0000 mg | INTRAMUSCULAR | Status: DC | PRN

## 2020-03-17 MED ORDER — IOHEXOL 350 MG/ML SOLN
INTRAVENOUS | Status: DC | PRN
  Administered 2020-03-17: 55 mL via INTRA_ARTERIAL

## 2020-03-17 MED ORDER — SODIUM CHLORIDE 0.9 % WEIGHT BASED INFUSION
1.0000 mL/kg/h | INTRAVENOUS | Status: DC
  Administered 2020-03-17: 1 mL/kg/h via INTRAVENOUS

## 2020-03-17 MED ORDER — FENTANYL CITRATE (PF) 100 MCG/2ML IJ SOLN
INTRAMUSCULAR | Status: DC | PRN
  Administered 2020-03-17: 25 ug via INTRAVENOUS

## 2020-03-17 MED ORDER — LABETALOL HCL 5 MG/ML IV SOLN
10.0000 mg | INTRAVENOUS | Status: DC | PRN
  Administered 2020-03-17: 10 mg via INTRAVENOUS
  Filled 2020-03-17: qty 4

## 2020-03-17 MED ORDER — ASPIRIN 81 MG PO CHEW: 81.0000 mg | CHEWABLE_TABLET | ORAL | Status: DC

## 2020-03-17 MED ORDER — SODIUM CHLORIDE 0.9% FLUSH: 3.0000 mL | INTRAVENOUS | Status: DC | PRN

## 2020-03-17 MED ORDER — HEPARIN SODIUM (PORCINE) 1000 UNIT/ML IJ SOLN
INTRAMUSCULAR | Status: AC
  Filled 2020-03-17: qty 1

## 2020-03-17 MED ORDER — SODIUM CHLORIDE 0.9 % IV SOLN: 250.0000 mL | INTRAVENOUS | Status: DC | PRN

## 2020-03-17 MED ORDER — FENTANYL CITRATE (PF) 100 MCG/2ML IJ SOLN
INTRAMUSCULAR | Status: AC
  Filled 2020-03-17: qty 2

## 2020-03-17 MED ORDER — VERAPAMIL HCL 2.5 MG/ML IV SOLN
INTRAVENOUS | Status: AC
  Filled 2020-03-17: qty 2

## 2020-03-17 MED ORDER — ACETAMINOPHEN 325 MG PO TABS: 650.0000 mg | ORAL_TABLET | ORAL | Status: DC | PRN

## 2020-03-17 MED ORDER — LIDOCAINE HCL (PF) 1 % IJ SOLN
INTRAMUSCULAR | Status: DC | PRN
  Administered 2020-03-17 (×2): 2 mL

## 2020-03-17 MED ORDER — VERAPAMIL HCL 2.5 MG/ML IV SOLN
INTRAVENOUS | Status: DC | PRN
  Administered 2020-03-17: 10 mL via INTRA_ARTERIAL

## 2020-03-17 MED ORDER — HEPARIN (PORCINE) IN NACL 1000-0.9 UT/500ML-% IV SOLN
INTRAVENOUS | Status: DC | PRN
  Administered 2020-03-17 (×2): 500 mL

## 2020-03-17 MED ORDER — SODIUM CHLORIDE 0.9 % WEIGHT BASED INFUSION
3.0000 mL/kg/h | INTRAVENOUS | Status: AC
  Administered 2020-03-17: 3 mL/kg/h via INTRAVENOUS

## 2020-03-17 MED ORDER — LIDOCAINE HCL (PF) 1 % IJ SOLN
INTRAMUSCULAR | Status: AC
  Filled 2020-03-17: qty 30

## 2020-03-17 MED ORDER — ONDANSETRON HCL 4 MG/2ML IJ SOLN: 4.0000 mg | Freq: Four times a day (QID) | INTRAMUSCULAR | Status: DC | PRN

## 2020-03-17 SURGICAL SUPPLY — 14 items
CATH 5FR JL3.5 JR4 ANG PIG MP (CATHETERS) ×2 IMPLANT
CATH BALLN WEDGE 5F 110CM (CATHETERS) ×2 IMPLANT
CATH INFINITI 5FR AL1 (CATHETERS) ×2 IMPLANT
DEVICE RAD TR BAND REGULAR (VASCULAR PRODUCTS) ×2 IMPLANT
GLIDESHEATH SLEND SS 6F .021 (SHEATH) ×2 IMPLANT
GUIDEWIRE .025 260CM (WIRE) ×2 IMPLANT
GUIDEWIRE INQWIRE 1.5J.035X260 (WIRE) ×1 IMPLANT
INQWIRE 1.5J .035X260CM (WIRE) ×2
KIT HEART LEFT (KITS) ×2 IMPLANT
PACK CARDIAC CATHETERIZATION (CUSTOM PROCEDURE TRAY) ×2 IMPLANT
SHEATH GLIDE SLENDER 4/5FR (SHEATH) ×2 IMPLANT
TRANSDUCER W/STOPCOCK (MISCELLANEOUS) ×2 IMPLANT
TUBING CIL FLEX 10 FLL-RA (TUBING) ×2 IMPLANT
WIRE EMERALD ST .035X150CM (WIRE) ×2 IMPLANT

## 2020-03-17 NOTE — Interval H&P Note (Signed)
History and Physical Interval Note:  03/17/2020 11:13 AM  [name]  has presented today for surgery, with the diagnosis of aortic stenosis.  The various methods of treatment have been discussed with the patient and family. After consideration of risks, benefits and other options for treatment, the patient has consented to  Procedure(s): RIGHT/LEFT HEART CATH AND CORONARY ANGIOGRAPHY (N/A) as a surgical intervention.  The patient's history has been reviewed, patient examined, no change in status, stable for surgery.  I have reviewed the patient's chart and labs.  Questions were answered to the patient's satisfaction.    Cath Lab Visit (complete for each Cath Lab visit)  Clinical Evaluation Leading to the Procedure:   ACS: No.  Non-ACS:    Anginal Classification: CCS II  Anti-ischemic medical therapy: No Therapy  Non-Invasive Test Results: No non-invasive testing performed  Prior CABG: No previous CABG        Lauree Chandler

## 2020-03-17 NOTE — Discharge Instructions (Signed)
NO METFORMIN/ GLUCOPHAGE FOR 2 DAYS   Radial Site Care  This sheet gives you information about how to care for yourself after your procedure. Your health care provider may also give you more specific instructions. If you have problems or questions, contact your health care provider. What can I expect after the procedure? After the procedure, it is common to have:  Bruising and tenderness at the catheter insertion area. Follow these instructions at home: Medicines  Take over-the-counter and prescription medicines only as told by your health care provider. Insertion site care  Follow instructions from your health care provider about how to take care of your insertion site. Make sure you: ? Wash your hands with soap and water before you change your bandage (dressing). If soap and water are not available, use hand sanitizer. ? Change your dressing as told by your health care provider. ? Leave stitches (sutures), skin glue, or adhesive strips in place. These skin closures may need to stay in place for 2 weeks or longer. If adhesive strip edges start to loosen and curl up, you may trim the loose edges. Do not remove adhesive strips completely unless your health care provider tells you to do that.  Check your insertion site every day for signs of infection. Check for: ? Redness, swelling, or pain. ? Fluid or blood. ? Pus or a bad smell. ? Warmth.  Do not take baths, swim, or use a hot tub until your health care provider approves.  You may shower 24-48 hours after the procedure, or as directed by your health care provider. ? Remove the dressing and gently wash the site with plain soap and water. ? Pat the area dry with a clean towel. ? Do not rub the site. That could cause bleeding.  Do not apply powder or lotion to the site. Activity  For 24 hours after the procedure, or as directed by your health care provider: ? Do not flex or bend the affected arm. ? Do not push or pull heavy  objects with the affected arm. ? Do not drive yourself home from the hospital or clinic. You may drive 24 hours after the procedure unless your health care provider tells you not to. ? Do not operate machinery or power tools.  Do not lift anything that is heavier than 10 lb (4.5 kg), or the limit that you are told, until your health care provider says that it is safe.  Ask your health care provider when it is okay to: ? Return to work or school. ? Resume usual physical activities or sports. ? Resume sexual activity.   General instructions  If the catheter site starts to bleed, raise your arm and put firm pressure on the site. If the bleeding does not stop, get help right away. This is a medical emergency.  If you went home on the same day as your procedure, a responsible adult should be with you for the first 24 hours after you arrive home.  Keep all follow-up visits as told by your health care provider. This is important. Contact a health care provider if:  You have a fever.  You have redness, swelling, or yellow drainage around your insertion site. Get help right away if:  You have unusual pain at the radial site.  The catheter insertion area swells very fast.  The insertion area is bleeding, and the bleeding does not stop when you hold steady pressure on the area.  Your arm or hand becomes pale, cool, tingly,   or numb. These symptoms may represent a serious problem that is an emergency. Do not wait to see if the symptoms will go away. Get medical help right away. Call your local emergency services (911 in the [place] Do not drive yourself to the hospital. Summary  After the procedure, it is common to have bruising and tenderness at the site.  Follow instructions from your health care provider about how to take care of your radial site wound. Check the wound every day for signs of infection.  Do not lift anything that is heavier than 10 lb (4.5 kg), or the limit that you are  told, until your health care provider says that it is safe. This information is not intended to replace advice given to you by your health care provider. Make sure you discuss any questions you have with your health care provider. Document Revised: 02/13/2017 Document Reviewed: 02/13/2017 Elsevier Patient Education  2021 [employer]vier Inc[employer]  

## 2020-03-17 NOTE — Telephone Encounter (Addendum)
-----   Message from Josue Hector, MD sent at 03/17/2020  2:20 PM EST ----- Agree - Pam make sure she has f/u appointment sooner rather than latter with Dr Roxy Manns or [facility] with [facility]    Placed referral for [facility].

## 2020-03-18 ENCOUNTER — Encounter (HOSPITAL_COMMUNITY): Payer: Self-pay | Admitting: Cardiovascular Disease

## 2020-03-18 ENCOUNTER — Telehealth: Payer: Self-pay | Admitting: Cardiovascular Disease

## 2020-03-18 NOTE — Telephone Encounter (Signed)
Patient states the "team" is suppose to meet Tuesday. She was told to discuss what steps were next for all the blockages and if she needs another procedure.   Advised Dr. Kyla Balzarine RN is off today and would be back Monday. She will let he know if the appointment on Monday is still needed or needs to be rescheduled.

## 2020-03-18 NOTE — Telephone Encounter (Addendum)
Called patient to let her know. Canceled appointment. States lauren from the hospital is going to call her after Tuesday for the next steps

## 2020-03-18 NOTE — Telephone Encounter (Signed)
She will need AVR and CABG can set her up to see Dr Roxy Manns or Cyndia Bent and does not need to be seen 03/21/20

## 2020-03-18 NOTE — Telephone Encounter (Signed)
Patient is requesting to speak with Dr. Kyla Balzarine nurse regarding her appointment on 03/21/20.  She states she would like to ensure that this appointment is still necessary. She states yesterday her heart cath was incomplete due to several blockages and aortic valve issues. She assumes she needs to reschedule for a later date.  Please advise.

## 2020-03-21 ENCOUNTER — Ambulatory Visit: Payer: Medicare Other | Admitting: Cardiovascular Disease

## 2020-03-22 ENCOUNTER — Other Ambulatory Visit: Payer: Self-pay

## 2020-03-22 DIAGNOSIS — E1122 Type 2 diabetes mellitus with diabetic chronic kidney disease: Secondary | ICD-10-CM

## 2020-03-22 DIAGNOSIS — I350 Nonrheumatic aortic (valve) stenosis: Secondary | ICD-10-CM

## 2020-03-25 ENCOUNTER — Other Ambulatory Visit: Payer: Medicare Other | Admitting: *Deleted

## 2020-03-25 ENCOUNTER — Other Ambulatory Visit: Payer: Self-pay

## 2020-03-25 DIAGNOSIS — E1122 Type 2 diabetes mellitus with diabetic chronic kidney disease: Secondary | ICD-10-CM

## 2020-03-25 DIAGNOSIS — I350 Nonrheumatic aortic (valve) stenosis: Secondary | ICD-10-CM

## 2020-03-25 LAB — BASIC METABOLIC PANEL
BUN/Creatinine Ratio: 20 (ref 12–28)
BUN: 36 mg/dL — ABNORMAL HIGH (ref 8–27)
CO2: 23 mmol/L (ref 20–29)
Calcium: 9.3 mg/dL (ref 8.7–10.3)
Chloride: 96 mmol/L (ref 96–106)
Creatinine, Ser: 1.77 mg/dL — ABNORMAL HIGH (ref 0.57–1.00)
Glucose: 226 mg/dL — ABNORMAL HIGH (ref 65–99)
Potassium: 4.2 mmol/L (ref 3.5–5.2)
Sodium: 134 mmol/L (ref 134–144)
eGFR: 29 mL/min/{1.73_m2} — ABNORMAL LOW (ref >59)

## 2020-03-30 ENCOUNTER — Institutional Professional Consult (permissible substitution) (INDEPENDENT_AMBULATORY_CARE_PROVIDER_SITE_OTHER): Payer: Medicare Other | Admitting: Thoracic Surgery (Cardiothoracic Vascular Surgery)

## 2020-03-30 ENCOUNTER — Other Ambulatory Visit (HOSPITAL_COMMUNITY): Payer: Self-pay | Admitting: Emergency Medicine

## 2020-03-30 ENCOUNTER — Other Ambulatory Visit: Payer: Self-pay | Admitting: Thoracic Surgery (Cardiothoracic Vascular Surgery)

## 2020-03-30 ENCOUNTER — Other Ambulatory Visit: Payer: Self-pay | Admitting: *Deleted

## 2020-03-30 ENCOUNTER — Encounter: Payer: Self-pay | Admitting: Thoracic Surgery (Cardiothoracic Vascular Surgery)

## 2020-03-30 ENCOUNTER — Encounter: Payer: Self-pay | Admitting: *Deleted

## 2020-03-30 ENCOUNTER — Other Ambulatory Visit: Payer: Self-pay

## 2020-03-30 VITALS — BP 166/76 | HR 100 | Temp 97.6°F | Resp 20 | Ht 63.0 in | Wt 143.0 lb

## 2020-03-30 DIAGNOSIS — N183 Chronic kidney disease, stage 3 unspecified: Secondary | ICD-10-CM

## 2020-03-30 DIAGNOSIS — Z9861 Coronary angioplasty status: Secondary | ICD-10-CM

## 2020-03-30 DIAGNOSIS — I2510 Atherosclerotic heart disease of native coronary artery without angina pectoris: Secondary | ICD-10-CM

## 2020-03-30 DIAGNOSIS — I350 Nonrheumatic aortic (valve) stenosis: Secondary | ICD-10-CM

## 2020-03-30 NOTE — Progress Notes (Signed)
HEART AND [place] SURGERY CONSULTATION REPORT  Primary Cardiologist is [name], MD PCP is Ronnald Nian, DO  Chief Complaint  Patient presents with  . Coronary Artery Disease    Surgical consult, ECHO 02/29/20, Cardiac Cath 03/17/20  . Aortic Stenosis    HPI:  Patient is a 80 year old female with history of aortic stenosis, coronary artery disease, hypertension, hyperlipidemia, type 2 diabetes mellitus with complications, peripheral arterial disease with chronic claudication, remote history of tobacco abuse, stage IV chronic kidney disease, anxiety and depression who has been referred for surgical consultation to discuss treatment options for management of aortic stenosis and multivessel coronary artery disease.  Patient's cardiac history dates back to 2007 when she underwent PCI and stenting of the right coronary artery.  She reportedly did not have a heart attack at that time.  For the last several years she has been followed intermittently by Dr. Nolon Lennert.  Nuclear stress test in 2017 revealed no ischemia.  Echocardiograms have documented the presence of normal left ventricular systolic function with aortic stenosis and mitral regurgitation that has gradually progressed in severity.  Over the last 2 to 3 months the patient has developed symptoms of exertional shortness of breath with vague choking sensation in her throat.  Symptoms have always been associated with physical exertion and are promptly relieved by rest, although they may occur with relatively low level activity.  She has not had any resting shortness of breath or chest discomfort.  She denies any history of PND, orthopnea, or lower extremity edema.  She has never had any dizzy spells or syncope.  She was seen urgently in the office by Dr. Johney Frame and transthoracic echocardiogram performed February 29, 2020 revealed progression in the severity of the patient's  aortic valve disease with at least moderate and probably stage D3 severe symptomatic aortic stenosis.  Left ventricular systolic function remain normal with ejection fraction estimated 65 to 70%.  There was moderate mitral regurgitation which appeared to be secondary (functional) and somewhat worse than previous echocardiograms.  The patient was referred to the multidisciplinary heart valve clinic and has been evaluated previously by Dr. Angelena Form.  Diagnostic cardiac catheterization was performed March 17, 2020.  Catheterization confirmed the presence of aortic stenosis with peak to peak and mean transvalvular gradients measured 34 and 26.9 mmHg, respectively.  There was severe multivessel coronary artery disease with long segment severe stenosis of the mid left anterior descending coronary artery and high-grade ostial stenosis of the right coronary artery with continued patency in the distal segment of the right coronary artery.  Cardiac output remain normal.  There was mild to moderate pulmonary hypertension.  Patient was referred for surgical consultation.  Patient is married and lives locally in [place].  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  She has remained reasonably active and functionally independent throughout retirement.  She enjoys working in her garden and Medical laboratory scientific officer vegetables.  She does not exercise on a regular basis.  She does not report several year history of chronic bilateral calf claudication.  Symptoms occur in both lower legs, typically with ambulating somewhere near 100 feet distance.  Symptoms are always relieved by rest.  Over the past 2 to 3 months the patient has developed progressive exertional shortness of breath with vague choking sensation in the neck.  She has not had any chest pain or chest tightness.  She has not had any resting shortness  of breath or resting symptoms of discomfort in her neck.  She denies any PND, orthopnea, or  lower extremity edema.  She has not had palpitations, dizzy spells, nor syncope.  Past Medical History:  Diagnosis Date  . Allergy   . Anemia    childhood  . Anxiety   . Aortic stenosis   . Arterial stenosis (HCC)    mesenteric  . Arthritis   . Breast cancer ([place]) 12/05/10   R breast, inv mammary, in situ,ER/PR +,HER2 -  . Cancer ([place])   . Carcinoma of breast treated with adjuvant chemotherapy ([place])   . Chronic kidney disease (CKD), active medical management without dialysis, stage 3 (moderate) ([place])   . Coronary artery disease    stent 2007  . Depression   . Diabetes mellitus   . Difficulty sleeping   . Diverticulosis 12/10/03  . Elevated cholesterol   . Generalized weakness   . GERD (gastroesophageal reflux disease)   . Heart murmur    secondary to chem. 2013  . Hepatitis    as an infant  . HSV-1 (herpes simplex virus 1) infection   . Hyperlipidemia   . Hypertension   . Hypothyroidism   . Osteoarthritis   . Personal history of chemotherapy   . Personal history of radiation therapy   . Pneumonia    2014 september  . 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 Spinal stenosis   . Thyroid disease     Past Surgical History:  Procedure Laterality Date  . ABDOMINAL AORTOGRAM W/LOWER EXTREMITY Bilateral 04/29/2019   Procedure: ABDOMINAL AORTOGRAM W/LOWER EXTREMITY;  Surgeon: Marty Heck, MD;  Location: [facility];  Service: Cardiovascular;  Laterality: Bilateral;  . ABDOMINAL AORTOGRAM W/LOWER EXTREMITY Left 07/01/2019   Procedure: ABDOMINAL AORTOGRAM W/ Left LOWER EXTREMITY [place];  Surgeon: Marty Heck, MD;  Location: [facility];  Service: Cardiovascular;  Laterality: Left;  . ANTERIOR AND POSTERIOR REPAIR N/A 04/05/2016   Procedure: ANTERIOR (CYSTOCELE) AND POSTERIOR REPAIR (RECTOCELE);  Surgeon: [name], MD;  Location: [facility];  Service: Gynecology;  Laterality: N/A;  . BREAST BIOPSY    . BREAST LUMPECTOMY  1983    benign biopsy  . BREAST LUMPECTOMY Right 12/29/2010   snbx, ER/PR +, Her2 -, 0/1 node pos.  . CARPAL TUNNEL RELEASE Bilateral 9/12,8,12  . CHOLECYSTECTOMY    . COLONOSCOPY N/A 02/09/2013   Procedure: COLONOSCOPY;  Surgeon: [name], MD;  Location: [facility];  Service: Endoscopy;  Laterality: N/A;  . COLONOSCOPY    . CORONARY ANGIOPLASTY    . DILATION AND CURETTAGE OF UTERUS    . ESOPHAGOGASTRODUODENOSCOPY N/A 02/09/2013   Procedure: ESOPHAGOGASTRODUODENOSCOPY (EGD);  Surgeon: [name], MD;  Location: [name];  Service: Endoscopy;  Laterality: N/A;  . FOOT SPURS    . HYSTEROSCOPY WITH D & C N/A 09/11/2012   Procedure: DILATATION AND CURETTAGE ;  Surgeon: [name], MD;  Location: [facility];  Service: Gynecology;  Laterality: N/A;  . KNEE SURGERY  2007  . LAPAROSCOPIC ASSISTED VAGINAL HYSTERECTOMY N/A 04/05/2016   Procedure: LAPAROSCOPIC ASSISTED VAGINAL HYSTERECTOMY possible BSO;  Surgeon: [name], MD;  Location: [facility];  Service: Gynecology;  Laterality: N/A;  . LUMBAR LAMINECTOMY/DECOMPRESSION MICRODISCECTOMY N/A 10/20/2014   Procedure: MICRO LUMBER DECOMPRESSION L3-4 L4-5;  Surgeon: [name], MD;  Location: [facility];  Service: Orthopedics;  Laterality: N/A;  . LUMBAR LAMINECTOMY/DECOMPRESSION MICRODISCECTOMY Bilateral 10/13/2015   Procedure: MICRO LUMBAR DECOMPRESSION L5 -  S1 AND REDO DECOMPRESSION L4 - L5 AND REMOVAL OF FACET CYST L4 - L5 2 LEVELS;  Surgeon: [name], MD;  Location: [facility];  Service: Orthopedics;  Laterality: Bilateral;  . PERIPHERAL VASCULAR INTERVENTION Right 04/29/2019   Procedure: PERIPHERAL VASCULAR INTERVENTION;  Surgeon: Marty Heck, MD;  Location: [facility];  Service: Cardiovascular;  Laterality: Right;  . PORT-A-CATH REMOVAL  04/17/2011   Procedure: REMOVAL PORT-A-CATH;  Surgeon: [name], MD;  Location: [facility];  Service: General;  Laterality: Left;  . PORTACATH PLACEMENT  02/07/2011    Procedure: INSERTION PORT-A-CATH;  Surgeon: [name], MD;  Location: [facility];  Service: General;  Laterality: N/A;  . RIGHT/LEFT HEART CATH AND CORONARY ANGIOGRAPHY N/A 03/17/2020   Procedure: RIGHT/LEFT HEART CATH AND CORONARY ANGIOGRAPHY;  Surgeon: Burnell Blanks, MD;  Location: [facility];  Service: Cardiovascular;  Laterality: N/A;  . UPPER GASTROINTESTINAL ENDOSCOPY      Family History  Problem Relation Age of Onset  . Throat cancer Father   . Alcoholism Father   . Heart attack Father   . Heart disease Mother   . Lymphoma Brother 41  . Cancer Son   . Heart attack Brother   . Diabetes Brother   . Hypertension Brother   . Colon cancer Neg Hx   . Stroke Neg Hx   . Esophageal cancer Neg Hx   . Rectal cancer Neg Hx   . Stomach cancer Neg Hx     Social History   Socioeconomic History  . Marital status: Married    Spouse name: Not on file  . Number of children: 3  . Years of education: Not on file  . Highest education level: Not on file  Occupational History  . Occupation: retired-bookkeeping  Tobacco Use  . Smoking status: Former Smoker    Packs/day: 2.00    Years: 40.00    Pack years: 80.00    Quit date: 12/19/1997    Years since quitting: 22.2  . Smokeless tobacco: Never Used  Vaping Use  . Vaping Use: Never used  Substance and Sexual Activity  . Alcohol use: Yes    Alcohol/week: 1.0 standard drink    Types: 1 Glasses of wine per week    Comment: occasional  . Drug use: No  . Sexual activity: Not on file    Comment: menarch age 37, P3, HRT X 10 YRS, MENOPAUSE MID 40'S  Other Topics Concern  . Not on file  Social History Narrative   [name] age 25- [place] age 73  [place]   Social Determinants of Health   Financial Resource Strain: Low Risk   . Difficulty of Paying Living Expenses: Not hard at all  Food Insecurity: No Food Insecurity  . Worried About Charity fundraiser in the Last Year: Never true  . Ran Out of  Food in the Last Year: Never true  Transportation Needs: No Transportation Needs  . Lack of Transportation (Medical): No  . Lack of Transportation (Non-Medical): No  Physical Activity: Inactive  . Days of Exercise per Week: 0 days  . Minutes of Exercise per Session: 0 min  Stress: No Stress Concern Present  . Feeling of Stress : Not at all  Social Connections: Socially Integrated  . Frequency of Communication with Friends and Family: More than three times a week  . Frequency of Social Gatherings with Friends and Family: More than three times a week  . Attends Religious  Services: More than 4 times per year  . Active Member of Clubs or Organizations: Yes  . Attends Archivist Meetings: More than 4 times per year  . Marital Status: Married  Human resources officer Violence: Not At Risk  . Fear of Current or Ex-Partner: No  . Emotionally Abused: No  . Physically Abused: No  . Sexually Abused: No    Current Outpatient Medications  Medication Sig Dispense Refill  . Accu-Chek FastClix Lancets MISC USE TO CHECK BLOOD SUGARS 3 TIMES DAILY 306 each 3  . acetaminophen (TYLENOL) 500 MG tablet Take 1,000 mg by mouth every 6 (six) hours as needed for moderate pain or headache.    Marland Kitchen aspirin EC 81 MG tablet Take 81 mg by mouth at bedtime.    . calcium carbonate (TUMS - DOSED IN MG ELEMENTAL CALCIUM) 500 MG chewable tablet Chew 2 tablets by mouth 3 (three) times daily as needed for indigestion or heartburn.    . Cholecalciferol (VITAMIN D3) 50 MCG (2000 UT) TABS Take 2,000 Units by mouth daily before lunch.    . clobetasol cream (TEMOVATE) 2.80 % Apply 1 application topically 2 (two) times daily. (Patient taking differently: Apply 1 application topically daily as needed (Itching).) 30 g 0  . ferrous sulfate (FERROUSUL) 325 (65 FE) MG tablet Take 1 tablet (325 mg total) by mouth daily with breakfast. 30 tablet 3  . furosemide (LASIX) 20 MG tablet Take 1 tablet (20 mg total) by mouth every Monday,  Wednesday, and Friday. 90 tablet 3  . glipiZIDE (GLUCOTROL XL) 10 MG 24 hr tablet Take 1 tablet (10 mg total) by mouth daily at 12 noon. 90 tablet 1  . glucose blood (ACCU-CHEK GUIDE) test strip TEST ONCE DAILY 100 each 5  . Golimumab (Zachary ARIA IV) Inject 1 Dose into the vein See admin instructions. Every 2 months.    . levothyroxine (SYNTHROID) 88 MCG tablet Take 1 tablet (88 mcg total) by mouth daily. 90 tablet 3  . losartan (COZAAR) 50 MG tablet Take 1 tablet (50 mg total) by mouth daily. 90 tablet 0  . Magnesium 250 MG TABS Take 250 mg by mouth at bedtime.    . melatonin 3 MG TABS tablet Take 3 mg by mouth at bedtime.    . metFORMIN (GLUCOPHAGE) 500 MG tablet Take 1 tablet (500 mg total) by mouth daily with breakfast. 90 tablet 3  . nitroGLYCERIN (NITROSTAT) 0.4 MG SL tablet Place 1 tablet (0.4 mg total) under the tongue every 5 (five) minutes as needed for chest pain. 25 tablet 3  . Omega-3 Fatty Acids (FISH OIL) 1200 MG CAPS Take 1,200 mg by mouth in the morning and at bedtime.    . pantoprazole (PROTONIX) 40 MG tablet Take 1 tablet (40 mg total) by mouth daily. 90 tablet 3  . PARoxetine (PAXIL) 20 MG tablet Take 1 tablet (20 mg total) by mouth in the morning. 90 tablet 3  . pioglitazone (ACTOS) 30 MG tablet Take 1 tablet (30 mg total) by mouth daily. (Patient taking differently: Take 30 mg by mouth at bedtime.) 90 tablet 1  . sitaGLIPtin (JANUVIA) 100 MG tablet Take 1 tablet (100 mg total) by mouth daily. (Patient taking differently: Take 100 mg by mouth at bedtime.) 90 tablet 3  . valACYclovir (VALTREX) 1000 MG tablet TAKE 1 TABLET BY MOUTH 2  TIMES DAILY AS NEEDED FOR  VIRAL INFECTION. (Patient taking differently: Take 1,000 mg by mouth daily as needed (Fever Blisters).) 180 tablet 0  .  vitamin B-12 (CYANOCOBALAMIN) 500 MCG tablet Take 500 mcg by mouth daily with lunch.     No current facility-administered medications for this visit.    Allergies  Allergen Reactions  . Codeine  Nausea And Vomiting and Other (See Comments)    Severe stomach cramps  . Antihistamines, Diphenhydramine-Type Other (See Comments)    Causes hyperactivity  . Gabapentin     Urinary incontinence  . Statins Other (See Comments)    Joint pains  . Sulfa Antibiotics     Unknown to pt  . Erythromycin Hives and Rash    Due to dental work about 50 years ago. Was used in packing and resulted in rash/hives inside and outside of mouth.      Review of Systems:   General:  normal appetite, decreased energy, no weight gain, no weight loss, no fever  Cardiac:  no chest pain with exertion, no chest pain at rest, +SOB with exertion, no resting SOB, no PND, no orthopnea, no palpitations, no arrhythmia, no atrial fibrillation, no LE edema, no dizzy spells, no syncope  Respiratory:  + exertional shortness of breath, no home oxygen, no productive cough, no dry cough, no bronchitis, no wheezing, no hemoptysis, no asthma, no pain with inspiration or cough, no sleep apnea, no CPAP at night  GI:   no difficulty swallowing, no reflux, no frequent heartburn, no hiatal hernia, no abdominal pain, + chronic constipation, no diarrhea, no hematochezia, no hematemesis, no melena  GU:   no dysuria,  no frequency, no urinary tract infection, no hematuria, no kidney stones, + chronic kidney disease  Vascular:  + pain suggestive of claudication, no pain in feet, no leg cramps, no varicose veins, no DVT, no non-healing foot ulcer  Neuro:   no stroke, no TIA's, no seizures, no headaches, no temporary blindness one eye,  no slurred speech, no peripheral neuropathy, no chronic pain, no instability of gait, mild memory/cognitive dysfunction  Musculoskeletal: + arthritis, + joint swelling, no myalgias, no difficulty walking other than claudication, somewhat decreased mobility   Skin:   no rash, no itching, no skin infections, no pressure sores or ulcerations  Psych:   + anxiety, + depression, no nervousness, no unusual recent  stress  Eyes:   no blurry vision, no floaters, no recent vision changes, + wears glasses for reading  ENT:   + hearing loss, no loose or painful teeth, no dentures, last saw dentist within the past 6 months  Hematologic:  no easy bruising, no abnormal bleeding, no clotting disorder, no frequent epistaxis  Endocrine:  + diabetes, does occasionally check CBG's at home           Physical Exam:   BP (!) 166/76   Pulse 100   Temp 97.6 F (36.4 C) (Skin)   Resp 20   Ht '5\' 3"'  (1.6 m)   Wt 143 lb (64.9 kg)   SpO2 95% Comment: RA  BMI 25.33 kg/m   General:  Elderly,  well-appearing  HEENT:  Unremarkable   Neck:   no JVD, no bruits, no adenopathy   Chest:   clear to auscultation, symmetrical breath sounds, no wheezes, no rhonchi   CV:   RRR, grade IV/VI crescendo/decrescendo murmur heard best at RUSB,  no diastolic murmur  Abdomen:  soft, non-tender, no masses   Extremities:  warm, well-perfused, pulses diminished, no LE edema  Rectal/GU  Deferred  Neuro:   Grossly non-focal and symmetrical throughout  Skin:   Clean and dry, no rashes, no  breakdown   Diagnostic Tests:  EKG: NSR w/out significant AV conduction delay (03/11/2020)     ECHOCARDIOGRAM REPORT    Patient Name:  [name] Date of Exam: 02/29/2020  Medical Rec #: [id number]    Height:    63.0 in  Accession #:  [id number]   Weight:    142.6 lb  Date of Birth: 1940-07-19   BSA:     1.675 m  Patient Age:  48 years    BP:      188/89 mmHg  Patient Gender: F        HR:      97 bpm.  Exam Location: [facility]   Procedure: 2D Echo, Cardiac Doppler and Color Doppler   Indications:  R06.00 SOB    History:    Patient has prior history of Echocardiogram examinations,  most         recent 01/01/2019. PAD and Breast Cancer, CKD; Risk         Factors:Diabetes, Hypertension and HLD.    Sonographer:  Marygrace Drought RCS  Referring Phys: [place]    1. The AoV is not well visualized but appears severely calcified with  restricted leaflet motion. Vmax 3.5 m/s, MG 33 mmHG, AVA 0.66 cm2, DI  0.26. The LV cavity is small and SVi = 34 cc/m2. Finding could represent  low flow low gradient severe AS, and  would recommend either TEE or aortic valve calcium score for clarification  if clinically indicated. The aortic valve is calcified. There is severe  calcifcation of the aortic valve. There is severe thickening of the aortic  valve. Aortic valve  regurgitation is not visualized. Moderate to severe aortic valve stenosis.  2. MR is now moderate and likely worsened in the setting of AS  progression. The mitral valve is grossly normal. Moderate mitral valve  regurgitation. No evidence of mitral stenosis.  3. Left ventricular ejection fraction, by estimation, is 65 to 70%. The  left ventricle has normal function. The left ventricle has no regional  wall motion abnormalities. There is moderate concentric left ventricular  hypertrophy. Left ventricular  diastolic function could not be evaluated.  4. Right ventricular systolic function is normal. The right ventricular  size is normal. Tricuspid regurgitation signal is inadequate for assessing  PA pressure.  5. The inferior vena cava is normal in size with greater than 50%  respiratory variability, suggesting right atrial pressure of 3 mmHg.   Comparison(s): Changes from prior study are noted. Moderate to severe AS  is now present. Moderate MR is now present. LV function remains similar.   FINDINGS  Left Ventricle: Left ventricular ejection fraction, by estimation, is 65  to 70%. The left ventricle has normal function. The left ventricle has no  regional wall motion abnormalities. The left ventricular internal cavity  size was normal in size. There is  moderate concentric left ventricular hypertrophy. Left ventricular  diastolic function could not be  evaluated due to mitral regurgitation  (moderate or greater). Left ventricular diastolic function could not be  evaluated.   Right Ventricle: The right ventricular size is normal. No increase in  right ventricular wall thickness. Right ventricular systolic function is  normal. Tricuspid regurgitation signal is inadequate for assessing PA  pressure.   Left Atrium: Left atrial size was normal in size.   Right Atrium: Right atrial size was normal in size.   Pericardium: Trivial pericardial effusion is present. Presence of  pericardial fat  pad.   Mitral Valve: MR is now moderate and likely worsened in the setting of AS  progression. The mitral valve is grossly normal. Mild mitral annular  calcification. Moderate mitral valve regurgitation. No evidence of mitral  valve stenosis.   Tricuspid Valve: The tricuspid valve is grossly normal. Tricuspid valve  regurgitation is trivial. No evidence of tricuspid stenosis.   Aortic Valve: The AoV is not well visualized but appears severely  calcified with restricted leaflet motion. Vmax 3.5 m/s, MG 33 mmHG, AVA  0.66 cm2, DI 0.26. The LV cavity is small and SVi = 34 cc/m2. Finding  could represent low flow low gradient severe  AS, and would recommend either TEE or aortic valve calcium score for  clarification if clinically indicated. The aortic valve is calcified.  There is severe calcifcation of the aortic valve. There is severe  thickening of the aortic valve. Aortic valve  regurgitation is not visualized. Moderate to severe aortic stenosis is  present. Aortic valve mean gradient measures 33.0 mmHg. Aortic valve peak  gradient measures 48.7 mmHg. Aortic valve area, by VTI measures 0.66 cm.   Pulmonic Valve: The pulmonic valve was grossly normal. Pulmonic valve  regurgitation is not visualized. No evidence of pulmonic stenosis.   Aorta: The aortic root and ascending aorta are structurally normal, with  no evidence of dilitation.   Venous:  The inferior vena cava is normal in size with greater than 50%  respiratory variability, suggesting right atrial pressure of 3 mmHg.   IAS/Shunts: The atrial septum is grossly normal.     LEFT VENTRICLE  PLAX 2D  LVIDd:     4.10 cm Diastology  LVIDs:     2.60 cm LV e' medial:  5.55 cm/s  LV PW:     1.40 cm LV E/e' medial: 27.4  LV IVS:    1.38 cm LV e' lateral:  4.24 cm/s  LVOT diam:   1.81 cm LV E/e' lateral: 35.8  LV SV:     57  LV SV Index:  34  LVOT Area:   2.57 cm     RIGHT VENTRICLE  RV Basal diam: 2.30 cm  RV S prime:   9.57 cm/s  TAPSE (M-mode): 2.3 cm   LEFT ATRIUM       Index    RIGHT ATRIUM     Index  LA diam:    3.30 cm 1.97 cm/m RA Area:   8.07 cm  LA Vol (A2C):  35.9 ml 21.44 ml/m RA Volume:  14.80 ml 8.84 ml/m  LA Vol (A4C):  36.7 ml 21.91 ml/m  LA Biplane Vol: 36.1 ml 21.56 ml/m  AORTIC VALVE  AV Area (Vmax):  0.61 cm  AV Area (Vmean):  0.54 cm  AV Area (VTI):   0.66 cm  AV Vmax:      349.00 cm/s  AV Vmean:     265.000 cm/s  AV VTI:      0.864 m  AV Peak Grad:   48.7 mmHg  AV Mean Grad:   33.0 mmHg  LVOT Vmax:     82.50 cm/s  LVOT Vmean:    55.700 cm/s  LVOT VTI:     0.223 m  LVOT/AV VTI ratio: 0.26    AORTA  Ao Root diam: 3.20 cm  Ao Asc diam: 2.90 cm   MITRAL VALVE  MV Area (PHT):        SHUNTS  MV Decel Time:        Systemic VTI: 0.22 m  MR Peak grad:  172.9 mmHg Systemic Diam: 1.81 cm  MR Mean grad:  108.5 mmHg  MR Vmax:     657.50 cm/s  MR Vmean:    477.5 cm/s  MR PISA:     2.65 cm  MR PISA Eff ROA: 12 mm  MR PISA Radius: 0.65 cm  MV E velocity: 152.00 cm/s  MV A velocity: 168.00 cm/s  MV E/A ratio: 0.90   Eleonore Chiquito MD  Electronically signed by Eleonore Chiquito MD  Signature Date/Time: 02/29/2020/10:45:35 AM        RIGHT/LEFT HEART CATH AND CORONARY ANGIOGRAPHY     Conclusion    Ost RCA to Prox RCA lesion is 80% stenosed.  Mid RCA lesion is 30% stenosed.  RPDA lesion is 60% stenosed.  1st Mrg lesion is 50% stenosed.  Prox Cx to Mid Cx lesion is 80% stenosed.  Mid LAD lesion is 80% stenosed.   1. No significant disease in the left main artery 2. Severe mid LAD stenosis. The LAD bifurcated distally beyond this lesion and the diagonal branch reaches the apex.  3. Moderate caliber non-dominant Circumflex with severe mid stenosis. The vessel is relatively small beyond the stenosis. The small to moderate caliber obtuse marginal branch arises prior to the stenosis and has moderate ostial stenosis.  4. The RCA is a large dominant artery. There is severe, heavily calcified stenosis at the ostium of the RCA. The distal stented segment is patent. There is moderately severe stenosis in the moderate caliber PDA.  5. Severe aortic stenosis (mean gradient 26.9 mmHg, peak gradient 34 mmHg)  Recommendations: She likely has paradoxical low flow/low gradient aortic. I had hoped we could proceed with TAVR, however, given the severe disease in her coronary arteries, we will have to explore surgical AVR/CABG as an option. The ostial RCA stenosis is heavily calcified and would require orbital atherectomy and would high risk for complications. The LAD stenosis could be easily treated with PCI/stenting. She is a very functional 80 yo female and I think she may be a candidate for CABG with AVR. We will let her go home today as there are no critical coronary lesions. We will plan her pre TAVR CT scans and will have her seen by one of our CT surgeons on the valve team.            Indications  Severe aortic stenosis [I35.0 (ICD-10-CM)]   Procedural Details  Technical Details Indication: 80 yo female with history of CAD, severe aortic stenosis with recent progressive dyspnea and fatigue. She was seen for TAVR workup.   Procedure: The risks, benefits, complications,  treatment options, and expected outcomes were discussed with the patient. The patient and/or family concurred with the proposed plan, giving informed consent. The patient was brought to the cath lab after IV hydration was given. The patient was sedated with Versed and Fentanyl. The IV catheter in the right antecubital vein was changed for a 5 [place] sheath. Right heart catheterization performed with a balloon tipped catheter. The right wrist was prepped and draped in a sterile fashion. 1% lidocaine was used for local anesthesia. Using the modified Seldinger access technique, a 5 French sheath was placed in the right radial artery. 3 mg Verapamil was given through the sheath. 3500 units IV heparin was given. Standard diagnostic catheters were used to perform selective coronary angiography. I crossed the aortic valve with an AL-1 catheter and straight wire. LV pressures measured. The sheath was removed from the right radial artery  and a Terumo hemostasis band was applied at the arteriotomy site on the right wrist.    Estimated blood loss <50 mL.   During this procedure medications were administered to achieve and maintain moderate conscious sedation while the patient's heart rate, blood pressure, and oxygen saturation were continuously monitored and I was present face-to-face 100% of this time.   Medications (Filter: Administrations occurring from 1153 to 1256 on 03/17/20) (important) Continuous medications are totaled by the amount administered until 03/17/20 1256.    midazolam (VERSED) injection (mg) Total dose:  1 mg  Date/Time Rate/Dose/Volume Action   03/17/20 1212 1 mg Given    fentaNYL (SUBLIMAZE) injection (mcg) Total dose:  25 mcg  Date/Time Rate/Dose/Volume Action   03/17/20 1212 25 mcg Given    lidocaine (PF) (XYLOCAINE) 1 % injection (mL) Total volume:  4 mL  Date/Time Rate/Dose/Volume Action   03/17/20 1226 2 mL Given   1226 2 mL Given    Radial Cocktail/Verapamil only  (mL) Total volume:  10 mL  Date/Time Rate/Dose/Volume Action   03/17/20 1227 10 mL Given    heparin sodium (porcine) injection (Units) Total dose:  3,500 Units  Date/Time Rate/Dose/Volume Action   03/17/20 1234 3,500 Units Given    iohexol (OMNIPAQUE) 350 MG/ML injection (mL) Total volume:  55 mL  Date/Time Rate/Dose/Volume Action   03/17/20 1250 55 mL Given    Heparin (Porcine) in NaCl 1000-0.9 UT/500ML-% SOLN (mL) Total volume:  1,000 mL  Date/Time Rate/Dose/Volume Action   03/17/20 1254 500 mL Given   1254 500 mL Given    Sedation Time  Sedation Time Physician-1: 35 minutes 28 seconds   Contrast  Medication Name Total Dose  iohexol (OMNIPAQUE) 350 MG/ML injection 55 mL    Radiation/Fluoro  Fluoro time: 6 (min) DAP: 9.5 (Gycm2) Cumulative Air Kerma: 627.0 (mGy)   Complications   Complications documented before study signed (03/17/2020 1:09 PM)     RIGHT/LEFT HEART CATH AND CORONARY ANGIOGRAPHY  None Documented by Burnell Blanks, MD 03/17/2020 1:02 PM  Date Found: 03/17/2020  Time Range: Intraprocedure       Coronary Findings   Diagnostic Dominance: Right  Left Anterior Descending  Vessel is large.  Mid LAD lesion is 80% stenosed.  Left Circumflex  Vessel is moderate in size.  Prox Cx to Mid Cx lesion is 80% stenosed.  First Obtuse Marginal Branch  1st Mrg lesion is 50% stenosed.  Right Coronary Artery  Vessel is large.  Ost RCA to Prox RCA lesion is 80% stenosed. The lesion is calcified.  Mid RCA lesion is 30% stenosed. The lesion was previously treated using a drug eluting stent over 2 years ago.  Right Posterior Descending Artery  RPDA lesion is 60% stenosed.   Intervention   No interventions have been documented.  Coronary Diagrams   Diagnostic Dominance: Right    Intervention    Implants    No implant documentation for this case.    Syngo Images  Show images for CARDIAC CATHETERIZATION  Images on  Long Term Storage  Show images for Maicy, Filip to Procedure Log  Procedure Log     Hemo Data  Flowsheet Row Most Recent Value  Fick Cardiac Output 7.8 L/min  Fick Cardiac Output Index 4.68 (L/min)/BSA  Aortic Mean Gradient 26.85 mmHg  Aortic Peak Gradient 34 mmHg  Aortic Valve Area 1.27  Aortic Value Area Index 0.76 cm2/BSA  RA A Wave 8 mmHg  RA V Wave 3 mmHg  RA  Mean 4 mmHg  RV Systolic Pressure 47 mmHg  RV Diastolic Pressure 3 mmHg  RV EDP 6 mmHg  PA Systolic Pressure 40 mmHg  PA Diastolic Pressure 16 mmHg  PA Mean 32 mmHg  PW A Wave 25 mmHg  PW V Wave 30 mmHg  PW Mean 22 mmHg  AO Systolic Pressure 956 mmHg  AO Diastolic Pressure 73 mmHg  AO Mean 387 mmHg  LV Systolic Pressure 564 mmHg  LV Diastolic Pressure 13 mmHg  LV EDP 20 mmHg  AOp Systolic Pressure 332 mmHg  AOp Diastolic Pressure 72 mmHg  AOp Mean Pressure 951 mmHg  LVp Systolic Pressure 884 mmHg  LVp Diastolic Pressure 21 mmHg  LVp EDP Pressure 26 mmHg  QP/QS 1  TPVR Index 6.84 HRUI  TSVR Index 24.59 HRUI  PVR SVR Ratio 0.09  TPVR/TSVR Ratio 0.28      Impression:  Patient has severe multivessel coronary artery disease and stage D3 severe low-flow, low gradient aortic stenosis.  She describes a several month history of progressive symptoms of exertional shortness of breath as well as vague choking discomfort in her neck consistent with chronic diastolic congestive heart failure and/or angina pectoris, New York Heart Association functional class III.  She has not had any symptoms at rest.  I have personally reviewed the patient's recent EKG, transthoracic echocardiogram, and diagnostic cardiac catheterization.  EKG reveals sinus rhythm with no significant AV conduction delay.  Echocardiogram reveals severe aortic stenosis with moderate mitral regurgitation.  Left ventricular systolic function remains normal with ejection fraction estimated 65 to 70%.  There were no significant wall motion  abnormalities.  The aortic valve is trileaflet with moderate to severely restricted leaflet mobility and calcification involving all 3 leaflets of the aortic valve.  Peak velocity across aortic valve measured 3.5 to 3.6 m/s corresponding to mean transvalvular gradient estimated 33 mmHg but aortic valve area calculated only 0.66 cm with DVI reported 0.26 and small left ventricular cavity volume with stroke-volume index 34 mL.  Diagnostic cardiac catheterization confirmed the presence of aortic stenosis and revealed mild to moderate pulmonary hypertension with severe multivessel coronary artery disease.  There was a long segment diffuse stenosis of the mid left anterior descending coronary artery as well as high-grade ostial stenosis of the right coronary artery.    I agree the patient would probably best be treated with coronary artery bypass grafting and conventional surgical aortic valve replacement.  PCI and stenting with transcatheter aortic valve replacement could be considered as an option, but the ostial disease of the right coronary artery is heavily calcified and would not likely be easily treated with PCI and stenting.  Risks associated with surgical aortic valve replacement and coronary artery bypass grafting will be somewhat elevated because of the patient's age and comorbid medical problems.  Nonetheless, risks do not appear to be prohibitive, and long-term outcome would clearly be better with surgical intervention.    Plan:  The patient and her daughter were counseled at length regarding treatment alternatives for management of severe symptomatic aortic stenosis and multivessel coronary artery disease. Alternative approaches such as conventional aortic valve replacement with coronary artery bypass grafting, transcatheter aortic valve replacement with multivessel PCI and stenting, and continued medical therapy without intervention were compared and contrasted at length.  The risks associated  with conventional surgery were discussed in detail, as were expectations for post-operative convalescence, prosthetic valve choices, and the advantages of treating both the patient's aortic stenosis and coronary artery disease using definitive surgical  intervention at the same time.  Concerns related to the suitability of the patient's coronary artery disease to PCI and stenting was discussed.  Issues specific to transcatheter aortic valve replacement were discussed including questions about long term valve durability, the potential for paravalvular leak, possible increased risk of need for permanent pacemaker placement, the suitability of the patient's surgical anatomy, and other potential technical complications related to the procedure itself.  Long-term prognosis without surgical intervention was discussed.  All treatment options were discussed in the context of the patient's own specific clinical presentation, past medical history, long-term prognosis and life expectancy.  All of their questions have been addressed.  The patient desires to proceed with aortic valve replacement and coronary artery bypass grafting in the near future.  We tentatively plan for surgery on April 07, 2020.  Prior to surgery we will obtain gated cardiac CT scan without contrast for calcium scoring and careful assessment of the size and anatomical characteristics of the aortic root and proximal ascending aorta.  The patient and her daughter understand and accept all potential associated risks of surgery including but not limited to risk of death, stroke, myocardial infarction, congestive heart failure, respiratory failure, renal failure, pneumonia, bleeding requiring blood transfusion and or reexploration, arrhythmia, heart block or bradycardia requiring permanent pacemaker, aortic dissection or other major vascular complication, pleural effusions or other delayed complications related to continued congestive heart failure, other late  complications related to valve replacement including structural valve deterioration and failure, thrombosis, endocarditis, or paravalvular leak, as well as a late recurrence of symptomatic ischemic heart disease.  All questions have been answered.       I spent in excess of 90 minutes during the conduct of this office consultation and >50% of this time involved direct face-to-face encounter with the patient for counseling and/or coordination of their care.     Valentina Gu. Roxy Manns, MD 03/30/2020 11:11 AM

## 2020-03-30 NOTE — Patient Instructions (Signed)
  Continue taking all current medications without change through the day before surgery.  Make sure to bring all of your medications with you when you come for your Pre-Admission Testing appointment at [facility] Short-Stay Department.  Have nothing to eat or drink after midnight the night before surgery.  On the morning of surgery take only Synthroid and Protonix with a sip of water.  At your appointment for Pre-Admission Testing at the [facility] Short-Stay Department you will be asked to sign permission forms for your upcoming surgery.  By definition your signature on these forms implies that you and/or your designee provide full informed consent for your planned surgical procedure(s), that alternative treatment options have been discussed, that you understand and accept any and all potential risks, and that you have some understanding of what to expect for your post-operative convalescence.  For any major cardiac surgical procedure potential operative risks include but are not limited to at least some risk of death, stroke or other neurologic complication, myocardial infarction, congestive heart failure, respiratory failure, renal failure, bleeding requiring blood transfusion and/or reexploration, irregular heart rhythm, heart block or bradycardia requiring permanent pacemaker, aortic dissection or other major vascular complication, pneumonia, pericardial effusion, pleural effusion, wound infection, pulmonary embolus or other thromboembolic complication, chronic pain, or other complications related to the specific procedure(s) performed.  Please call to schedule a follow-up appointment in our office prior to surgery if you have any unresolved questions about your planned surgical procedure, the associated risks, alternative treatment options, and/or expectations for your post-operative recovery.

## 2020-03-31 ENCOUNTER — Other Ambulatory Visit: Payer: Self-pay | Admitting: Thoracic Surgery (Cardiothoracic Vascular Surgery)

## 2020-03-31 ENCOUNTER — Other Ambulatory Visit: Payer: Self-pay

## 2020-03-31 ENCOUNTER — Ambulatory Visit (HOSPITAL_COMMUNITY)
Admission: RE | Admit: 2020-03-31 | Discharge: 2020-03-31 | Disposition: A | Discharge status: Home / Self Care | Payer: Medicare Other | Source: Ambulatory Visit | Attending: Thoracic Surgery (Cardiothoracic Vascular Surgery) | Admitting: Thoracic Surgery (Cardiothoracic Vascular Surgery)

## 2020-03-31 DIAGNOSIS — Z9861 Coronary angioplasty status: Secondary | ICD-10-CM | POA: Diagnosis present

## 2020-03-31 DIAGNOSIS — I2510 Atherosclerotic heart disease of native coronary artery without angina pectoris: Secondary | ICD-10-CM | POA: Insufficient documentation

## 2020-03-31 DIAGNOSIS — J189 Pneumonia, unspecified organism: Secondary | ICD-10-CM

## 2020-03-31 MED ORDER — LEVOFLOXACIN 500 MG PO TABS: 500.0000 mg | ORAL_TABLET | Freq: Every day | ORAL | 0 refills | Status: AC

## 2020-04-01 ENCOUNTER — Other Ambulatory Visit (HOSPITAL_COMMUNITY)
Admission: RE | Admit: 2020-04-01 | Discharge: 2020-04-01 | Disposition: A | Discharge status: Home / Self Care | Payer: Medicare Other | Source: Ambulatory Visit | Attending: Thoracic Surgery (Cardiothoracic Vascular Surgery) | Admitting: Thoracic Surgery (Cardiothoracic Vascular Surgery)

## 2020-04-01 ENCOUNTER — Other Ambulatory Visit: Payer: Self-pay

## 2020-04-01 ENCOUNTER — Ambulatory Visit
Admission: RE | Admit: 2020-04-01 | Discharge: 2020-04-01 | Disposition: A | Discharge status: Home / Self Care | Payer: Medicare Other | Source: Ambulatory Visit | Attending: Thoracic Surgery (Cardiothoracic Vascular Surgery) | Admitting: Thoracic Surgery (Cardiothoracic Vascular Surgery)

## 2020-04-01 ENCOUNTER — Other Ambulatory Visit: Payer: Self-pay | Admitting: *Deleted

## 2020-04-01 DIAGNOSIS — J189 Pneumonia, unspecified organism: Secondary | ICD-10-CM

## 2020-04-01 DIAGNOSIS — Z01812 Encounter for preprocedural laboratory examination: Secondary | ICD-10-CM | POA: Diagnosis present

## 2020-04-01 DIAGNOSIS — Z20822 Contact with and (suspected) exposure to covid-19: Secondary | ICD-10-CM | POA: Diagnosis not present

## 2020-04-01 LAB — CBC WITH DIFFERENTIAL/PLATELET
Absolute Monocytes: 1591 cells/uL — ABNORMAL HIGH (ref 200–950)
Basophils Absolute: 54 cells/uL (ref 0–200)
Basophils Relative: 0.4 %
Eosinophils Absolute: 163 cells/uL (ref 15–500)
Eosinophils Relative: 1.2 %
HCT: 37.2 % (ref 35.0–45.0)
Hemoglobin: 12.2 g/dL (ref 11.7–15.5)
Lymphs Abs: 2870 cells/uL (ref 850–3900)
MCH: 29 pg (ref 27.0–33.0)
MCHC: 32.8 g/dL (ref 32.0–36.0)
MCV: 88.4 fL (ref 80.0–100.0)
MPV: 10.5 fL (ref 7.5–12.5)
Monocytes Relative: 11.7 %
Neutro Abs: 8922 cells/uL — ABNORMAL HIGH (ref 1500–7800)
Neutrophils Relative %: 65.6 %
Platelets: 274 10*3/uL (ref 140–400)
RBC: 4.21 10*6/uL (ref 3.80–5.10)
RDW: 13 % (ref 11.0–15.0)
Total Lymphocyte: 21.1 %
WBC: 13.6 10*3/uL — ABNORMAL HIGH (ref 3.8–10.8)

## 2020-04-01 LAB — SARS CORONAVIRUS 2 (TAT 6-24 HRS): SARS Coronavirus 2: NEGATIVE

## 2020-04-04 ENCOUNTER — Telehealth (INDEPENDENT_AMBULATORY_CARE_PROVIDER_SITE_OTHER): Payer: Medicare Other | Admitting: Thoracic Surgery (Cardiothoracic Vascular Surgery)

## 2020-04-04 ENCOUNTER — Encounter: Payer: Self-pay | Admitting: Thoracic Surgery (Cardiothoracic Vascular Surgery)

## 2020-04-04 ENCOUNTER — Other Ambulatory Visit: Payer: Self-pay

## 2020-04-04 DIAGNOSIS — I350 Nonrheumatic aortic (valve) stenosis: Secondary | ICD-10-CM

## 2020-04-04 DIAGNOSIS — I2510 Atherosclerotic heart disease of native coronary artery without angina pectoris: Secondary | ICD-10-CM | POA: Diagnosis not present

## 2020-04-04 NOTE — Progress Notes (Signed)
[address]       [place] [address]             [phone]     CARDIOTHORACIC SURGERY TELEPHONE VIRTUAL OFFICE NOTE  Primary Cardiologist is [name] Rouge, MD PCP is [name] Nian, DO   HPI:  I spoke with [name] (DOB [birth year] ) via telephone on 04/04/2020 at 1:30 PM and verified that I was speaking with the correct person using more than one form of identification.  We discussed the fact that I was contacting them from my office and they were located at home, as well as the reason(s) for conducting our visit virtually instead of in-person.  The patient expressed understanding the circumstances and agreed to proceed as described.   Patient is a 80 year old female with history of stage D3 aortic stenosis, severe multivessel coronary artery disease, hypertension, hyperlipidemia, type 2 diabetes mellitus with complications, peripheral arterial disease with chronic claudication, remote history of tobacco abuse, stage IV chronic kidney disease, anxiety and depression who was referred for surgical consultation to discuss treatment options for management of aortic stenosis and multivessel coronary artery disease.  She was initially seen in consultation on March 30, 2020 at which time we discussed treatment options at length with tentative plans to proceed with high risk coronary artery bypass grafting and aortic valve replacement in the near future.  However, gated CT angiogram of the heart revealed evidence suggestive of pneumonia involving the right middle lobe and this was confirmed on follow-up chest radiograph performed April 01, 2020.  White blood count was elevated 13,600 and Covid testing was negative.  The patient was given a prescription for a 7-day course of oral Levaquin for presumed community acquired pneumonia.  I spoke with the patient this afternoon over the telephone to check and see how she is doing.  She reports that just within a few days of  oral antibiotics she is feeling much better.  Her breathing has improved considerably and her chest discomfort has resolved.  She does not have productive cough.   Current Outpatient Medications  Medication Sig Dispense Refill  . Accu-Chek FastClix Lancets MISC USE TO CHECK BLOOD SUGARS 3 TIMES DAILY 306 each 3  . acetaminophen (TYLENOL) 500 MG tablet Take 1,000 mg by mouth every 6 (six) hours as needed for moderate pain or headache.    Marland Kitchen aspirin EC 81 MG tablet Take 81 mg by mouth at bedtime.    . calcium carbonate (TUMS - DOSED IN MG ELEMENTAL CALCIUM) 500 MG chewable tablet Chew 2 tablets by mouth 3 (three) times daily as needed for indigestion or heartburn.    . Cholecalciferol (VITAMIN D3) 25 MCG (1000 UT) CAPS Take 1,000 Units by mouth daily before lunch.    . clobetasol cream (TEMOVATE) 1.91 % Apply 1 application topically 2 (two) times daily. (Patient not taking: No sig reported) 30 g 0  . ferrous sulfate (FERROUSUL) 325 (65 FE) MG tablet Take 1 tablet (325 mg total) by mouth daily with breakfast. (Patient not taking: No sig reported) 30 tablet 3  . furosemide (LASIX) 20 MG tablet Take 1 tablet (20 mg total) by mouth every Monday, Wednesday, and Friday. 90 tablet 3  . glipiZIDE (GLUCOTROL XL) 10 MG 24 hr tablet Take 1 tablet (10 mg total) by mouth daily at 12 noon. 90 tablet 1  . glucose blood (ACCU-CHEK GUIDE) test strip TEST ONCE DAILY 100 each 5  . Golimumab ([place] ARIA IV)  Inject 1 Dose into the vein every 8 (eight) weeks.    Marland Kitchen levofloxacin (LEVAQUIN) 500 MG tablet Take 1 tablet (500 mg total) by mouth daily for 7 days. 7 tablet 0  . levothyroxine (SYNTHROID) 88 MCG tablet Take 1 tablet (88 mcg total) by mouth daily. 90 tablet 3  . losartan (COZAAR) 50 MG tablet Take 1 tablet (50 mg total) by mouth daily. 90 tablet 0  . Magnesium 250 MG TABS Take 250 mg by mouth at bedtime.    . melatonin 3 MG TABS tablet Take 3 mg by mouth at bedtime.    . metFORMIN (GLUCOPHAGE) 500 MG tablet Take  1 tablet (500 mg total) by mouth daily with breakfast. 90 tablet 3  . nitroGLYCERIN (NITROSTAT) 0.4 MG SL tablet Place 1 tablet (0.4 mg total) under the tongue every 5 (five) minutes as needed for chest pain. 25 tablet 3  . Omega-3 Fatty Acids (FISH OIL) 1200 MG CAPS Take 1,200 mg by mouth in the morning and at bedtime.    . pantoprazole (PROTONIX) 40 MG tablet Take 1 tablet (40 mg total) by mouth daily. 90 tablet 3  . PARoxetine (PAXIL) 20 MG tablet Take 1 tablet (20 mg total) by mouth in the morning. 90 tablet 3  . pioglitazone (ACTOS) 30 MG tablet Take 1 tablet (30 mg total) by mouth daily. (Patient taking differently: Take 30 mg by mouth at bedtime.) 90 tablet 1  . sitaGLIPtin (JANUVIA) 100 MG tablet Take 1 tablet (100 mg total) by mouth daily. (Patient taking differently: Take 100 mg by mouth at bedtime.) 90 tablet 3  . valACYclovir (VALTREX) 1000 MG tablet TAKE 1 TABLET BY MOUTH 2  TIMES DAILY AS NEEDED FOR  VIRAL INFECTION. (Patient taking differently: Take 1,000 mg by mouth daily as needed (Fever Blisters).) 180 tablet 0  . vitamin B-12 (CYANOCOBALAMIN) 500 MCG tablet Take 1,000 mcg by mouth daily with lunch.     No current facility-administered medications for this visit.     Diagnostic Tests:  Results for [name] (MRN [id number]) as of 04/04/2020 13:30  Ref. Range 04/01/2020 00:00  WBC Latest Ref Range: 3.8 - 10.8 Thousand/uL 13.6 (H)    CHEST - 2 VIEW  COMPARISON:  Cardiac CT 03/31/2020. Chest radiographs 02/26/2020 and earlier.  FINDINGS: Stable lung volumes and mediastinal contours. Confluent airspace opacity redemonstrated in the lateral segment of the right middle lobe on both views corresponding to the most confluent area of lung opacity yesterday. Additional right upper lobe and left lower lobe peribronchial opacity is less apparent. No pneumothorax. No pleural effusion. Stable right chest wall surgical clips. Visualized tracheal air column is within normal  limits. No acute osseous abnormality identified. Negative visible bowel gas pattern.  IMPRESSION: Multifocal bronchopneumonia/pneumonia as seen by CT yesterday, most confluent in the right middle lobe. No pleural effusion.   Electronically Signed   By: Genevie Ann M.D.   On: 04/01/2020 10:28    Impression:  Community-acquired pneumonia with symptomatic improvement on oral antibiotics.  Plan:  Patient will return to our office in approximately 2 weeks for follow-up chest x-ray.  We will readdress whether or not to proceed with elective aortic valve replacement and coronary artery bypass grafting at that time.  All questions have been answered.    I discussed limitations of evaluation and management via telephone.  The patient was advised to call back for repeat telephone consultation or to seek an in-person evaluation if questions arise or the patient's clinical condition  changes in any significant manner.  I spent in excess of 10 minutes of non-face-to-face time during the conduct of this telephone virtual office consultation, including pre-visit review of the patient's records and direct conversation with the patient.      Valentina Gu. Roxy Manns, MD 04/04/2020 1:30 PM

## 2020-04-05 ENCOUNTER — Ambulatory Visit (HOSPITAL_COMMUNITY): Payer: Medicare Other

## 2020-04-05 ENCOUNTER — Inpatient Hospital Stay (HOSPITAL_COMMUNITY): Admission: RE | Admit: 2020-04-05 | Payer: Medicare Other | Source: Ambulatory Visit

## 2020-04-05 ENCOUNTER — Other Ambulatory Visit (HOSPITAL_COMMUNITY): Payer: Medicare Other

## 2020-04-07 ENCOUNTER — Inpatient Hospital Stay: Admit: 2020-04-07 | Payer: Medicare Other | Admitting: Thoracic Surgery (Cardiothoracic Vascular Surgery)

## 2020-04-07 SURGERY — CORONARY ARTERY BYPASS GRAFTING (CABG)
Anesthesia: General | Site: Chest

## 2020-04-13 ENCOUNTER — Encounter: Payer: Self-pay | Admitting: Family Medicine

## 2020-04-13 ENCOUNTER — Other Ambulatory Visit: Payer: Self-pay

## 2020-04-13 ENCOUNTER — Ambulatory Visit (INDEPENDENT_AMBULATORY_CARE_PROVIDER_SITE_OTHER): Payer: Medicare Other | Admitting: Family Medicine

## 2020-04-13 VITALS — BP 138/80 | HR 87 | Temp 97.1°F | Ht 63.0 in | Wt 146.6 lb

## 2020-04-13 DIAGNOSIS — J189 Pneumonia, unspecified organism: Secondary | ICD-10-CM

## 2020-04-13 DIAGNOSIS — I2510 Atherosclerotic heart disease of native coronary artery without angina pectoris: Secondary | ICD-10-CM | POA: Diagnosis not present

## 2020-04-13 NOTE — Progress Notes (Signed)
[name] is a 80 y.o. female  Chief Complaint  Patient presents with  . Follow-up    Pt here to discuss lab results.    HPI: [name] is a 80 y.o. female patient seen today to follow-up on recent diagnosis of pneumonia. This was found incidentally on CT coronary calcium score ahead of scheduled CABG. Pt also found to have elevated WBC count at 13.6. covid test negative. She was Rx'd levaquin x 7 days. She states she is feeling better, completed abx. She has f/u appt with cardio next week and CABG will be rescheduled. She has no acute issues or concerns today.  She is anxious and ready to proceed with CABG   Past Medical History:  Diagnosis Date  . Allergy   . Anemia    childhood  . Anxiety   . Aortic stenosis   . Arterial stenosis (HCC)    mesenteric  . Arthritis   . Breast cancer ([facility]) 12/05/10   R breast, inv mammary, in situ,ER/PR +,HER2 -  . Cancer ([place])   . Carcinoma of breast treated with adjuvant chemotherapy ([place])   . 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[place])   . Claudication ([place])   . Coronary artery disease    stent 2007  . Depression   . Diabetes mellitus   . Difficulty sleeping   . Diverticulosis 12/10/03  . Elevated cholesterol   . Generalized weakness   . GERD (gastroesophageal reflux disease)   . Heart murmur   . Hepatitis    as an infant  . HSV-1 (herpes simplex virus 1) infection   . Hyperlipidemia   . Hypertension   . Hypothyroidism   . Osteoarthritis   . PAD (peripheral artery disease) ([facility])   . Personal history of chemotherapy   . Personal history of radiation therapy   . Pneumonia    2014 september  . 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 Spinal stenosis   . Thyroid disease     Past Surgical History:  Procedure Laterality Date  . ABDOMINAL AORTOGRAM W/LOWER EXTREMITY Bilateral 04/29/2019   Procedure: ABDOMINAL AORTOGRAM W/LOWER EXTREMITY;  Surgeon: Marty Heck, MD;  Location: [facility];  Service: Cardiovascular;  Laterality: Bilateral;  . ABDOMINAL AORTOGRAM W/LOWER EXTREMITY Left 07/01/2019   Procedure: ABDOMINAL AORTOGRAM W/ Left LOWER EXTREMITY [place];  Surgeon: Marty Heck, MD;  Location: [facility];  Service: Cardiovascular;  Laterality: Left;  . ANTERIOR AND POSTERIOR REPAIR N/A 04/05/2016   Procedure: ANTERIOR (CYSTOCELE) AND POSTERIOR REPAIR (RECTOCELE);  Surgeon: [name], MD;  Location: [facility];  Service: Gynecology;  Laterality: N/A;  . BREAST BIOPSY    . BREAST LUMPECTOMY  1983   benign biopsy  . BREAST LUMPECTOMY Right 12/29/2010   snbx, ER/PR +, Her2 -, 0/1 node pos.  . CARPAL TUNNEL RELEASE Bilateral 9/12,8,12  . CHOLECYSTECTOMY    . COLONOSCOPY N/A 02/09/2013   Procedure: COLONOSCOPY;  Surgeon: [name], MD;  [facility]: [facility];  Service: Endoscopy;  Laterality: N/A;  . COLONOSCOPY    . CORONARY ANGIOPLASTY    . DILATION AND CURETTAGE OF UTERUS    . ESOPHAGOGASTRODUODENOSCOPY N/A 02/09/2013   Procedure: ESOPHAGOGASTRODUODENOSCOPY (EGD);  Surgeon: [name], MD;  Location: [name];  Service: Endoscopy;  Laterality: N/A;  . FOOT SPURS    . HYSTEROSCOPY WITH D & C N/A 09/11/2012   Procedure: DILATATION AND CURETTAGE ;  Surgeon: [name], MD;  Location: [facility];  Service: Gynecology;  Laterality: N/A;  . KNEE SURGERY  2007  . LAPAROSCOPIC ASSISTED VAGINAL HYSTERECTOMY N/A 04/05/2016   Procedure: LAPAROSCOPIC ASSISTED VAGINAL HYSTERECTOMY possible BSO;  Surgeon: [name], MD;  Location: [facility];  Service: Gynecology;  Laterality: N/A;  . LUMBAR LAMINECTOMY/DECOMPRESSION MICRODISCECTOMY N/A 10/20/2014   Procedure: MICRO LUMBER DECOMPRESSION L3-4 L4-5;  Surgeon: [name], MD;  [facility]: [facility];  Service: Orthopedics;  Laterality: N/A;  . LUMBAR LAMINECTOMY/DECOMPRESSION MICRODISCECTOMY Bilateral 10/13/2015   Procedure: MICRO LUMBAR DECOMPRESSION L5 - S1 AND REDO DECOMPRESSION L4 - L5 AND REMOVAL OF FACET  CYST L4 - L5 2 LEVELS;  Surgeon: [name], MD;  Location: [facility];  Service: Orthopedics;  Laterality: Bilateral;  . PERIPHERAL VASCULAR INTERVENTION Right 04/29/2019   Procedure: PERIPHERAL VASCULAR INTERVENTION;  Surgeon: Marty Heck, MD;  Location: [facility];  Service: Cardiovascular;  Laterality: Right;  . PORT-A-CATH REMOVAL  04/17/2011   Procedure: REMOVAL PORT-A-CATH;  Surgeon: [name], MD;  Location: [facility];  Service: General;  Laterality: Left;  . PORTACATH PLACEMENT  02/07/2011   Procedure: INSERTION PORT-A-CATH;  Surgeon: [name], MD;  [facility];  Service: General;  Laterality: N/A;  . RIGHT/LEFT HEART CATH AND CORONARY ANGIOGRAPHY N/A 03/17/2020   Procedure: RIGHT/LEFT HEART CATH AND CORONARY ANGIOGRAPHY;  Surgeon: Burnell Blanks, MD;  Location: [facility];  Service: Cardiovascular;  Laterality: N/A;  . UPPER GASTROINTESTINAL ENDOSCOPY      Social History   Socioeconomic History  . Marital status: Married    Spouse name: Not on file  . Number of children: 3  . Years of education: Not on file  . Highest education level: Not on file  Occupational History  . Occupation: retired-bookkeeping  Tobacco Use  . Smoking status: Former Smoker    Packs/day: 2.00    Years: 40.00    Pack years: 80.00    Quit date: 12/19/1997    Years since quitting: 22.3  . Smokeless tobacco: Never Used  Vaping Use  . Vaping Use: Never used  Substance and Sexual Activity  . Alcohol use: Yes    Alcohol/week: 1.0 standard drink    Types: 1 Glasses of wine per week    Comment: occasional  . Drug use: No  . Sexual activity: Not on file    Comment: menarch age 3, P3, HRT X 10 YRS, MENOPAUSE MID 40'S  Other Topics Concern  . Not on file  Social History Narrative   [name] age 44- [place] age 58  [place]   Social Determinants of Health   Financial Resource Strain: Low Risk   . Difficulty of Paying  Living Expenses: Not hard at all  Food Insecurity: No Food Insecurity  . Worried About Charity fundraiser in the Last Year: Never true  . Ran Out of Food in the Last Year: Never true  Transportation Needs: No Transportation Needs  . Lack of Transportation (Medical): No  . Lack of Transportation (Non-Medical): No  Physical Activity: Inactive  . Days of Exercise per Week: 0 days  . Minutes of Exercise per Session: 0 min  Stress: No Stress Concern Present  . Feeling of Stress : Not at all  Social Connections: Socially Integrated  . Frequency of Communication with Friends and Family: More than three times a week  . Frequency of Social Gatherings with Friends and Family: More than three times a week  .  Attends Religious Services: More than 4 times per year  . Active Member of Clubs or Organizations: Yes  . Attends Archivist Meetings: More than 4 times per year  . Marital Status: Married  Human resources officer Violence: Not At Risk  . Fear of Current or Ex-Partner: No  . Emotionally Abused: No  . Physically Abused: No  . Sexually Abused: No    Family History  Problem Relation Age of Onset  . Throat cancer Father   . Alcoholism Father   . Heart attack Father   . Heart disease Mother   . Lymphoma Brother 73  . Cancer Son   . Heart attack Brother   . Diabetes Brother   . Hypertension Brother   . Colon cancer Neg Hx   . Stroke Neg Hx   . Esophageal cancer Neg Hx   . Rectal cancer Neg Hx   . Stomach cancer Neg Hx      Immunization History  Administered Date(s) Administered  . Fluad Quad(high Dose 65+) 11/20/2018  . Influenza Split 12/07/2014  . Influenza, High Dose Seasonal PF 11/26/2016  . Influenza,inj,quad, With Preservative 02/22/2017, 11/20/2018  . Influenza-Unspecified 10/31/2009, 12/01/2010, 12/03/2011, 11/05/2012, 11/05/2013, 10/25/2014, 11/10/2015, 11/27/2017, 10/07/2019  . PFIZER(Purple Top)SARS-COV-2 Vaccination 02/12/2019, 03/05/2019  . Pneumococcal  Conjugate-13 07/30/2014  . Pneumococcal Polysaccharide-23 11/19/2007, 11/15/2008  . Pneumococcal-Unspecified 01/23/2010  . Zoster Recombinat (Shingrix) 02/01/2011    Outpatient Encounter Medications as of 04/13/2020  Medication Sig  . Accu-Chek FastClix Lancets MISC USE TO CHECK BLOOD SUGARS 3 TIMES DAILY  . acetaminophen (TYLENOL) 500 MG tablet Take 1,000 mg by mouth every 6 (six) hours as needed for moderate pain or headache.  Marland Kitchen aspirin EC 81 MG tablet Take 81 mg by mouth at bedtime.  . Cholecalciferol (VITAMIN D3) 25 MCG (1000 UT) CAPS Take 1,000 Units by mouth daily before lunch.  . furosemide (LASIX) 20 MG tablet Take 1 tablet (20 mg total) by mouth every Monday, Wednesday, and Friday.  Marland Kitchen glipiZIDE (GLUCOTROL XL) 10 MG 24 hr tablet Take 1 tablet (10 mg total) by mouth daily at 12 noon.  Marland Kitchen glucose blood (ACCU-CHEK GUIDE) test strip TEST ONCE DAILY  . Golimumab (SIMPONI ARIA IV) Inject 1 Dose into the vein every 8 (eight) weeks.  Marland Kitchen levothyroxine (SYNTHROID) 88 MCG tablet Take 1 tablet (88 mcg total) by mouth daily.  Marland Kitchen losartan (COZAAR) 50 MG tablet Take 1 tablet (50 mg total) by mouth daily.  . Magnesium 250 MG TABS Take 250 mg by mouth at bedtime.  . melatonin 3 MG TABS tablet Take 3 mg by mouth at bedtime.  . metFORMIN (GLUCOPHAGE) 500 MG tablet Take 1 tablet (500 mg total) by mouth daily with breakfast.  . nitroGLYCERIN (NITROSTAT) 0.4 MG SL tablet Place 1 tablet (0.4 mg total) under the tongue every 5 (five) minutes as needed for chest pain.  . Omega-3 Fatty Acids (FISH OIL) 1200 MG CAPS Take 1,200 mg by mouth in the morning and at bedtime.  . pantoprazole (PROTONIX) 40 MG tablet Take 1 tablet (40 mg total) by mouth daily.  Marland Kitchen PARoxetine (PAXIL) 20 MG tablet Take 1 tablet (20 mg total) by mouth in the morning.  . pioglitazone (ACTOS) 30 MG tablet Take 1 tablet (30 mg total) by mouth daily. (Patient taking differently: Take 30 mg by mouth at bedtime.)  . sitaGLIPtin (JANUVIA) 100 MG  tablet Take 1 tablet (100 mg total) by mouth daily. (Patient taking differently: Take 100 mg by mouth at bedtime.)  .  valACYclovir (VALTREX) 1000 MG tablet TAKE 1 TABLET BY MOUTH 2  TIMES DAILY AS NEEDED FOR  VIRAL INFECTION. (Patient taking differently: Take 1,000 mg by mouth daily as needed (Fever Blisters).)  . vitamin B-12 (CYANOCOBALAMIN) 500 MCG tablet Take 1,000 mcg by mouth daily with lunch.  . calcium carbonate (TUMS - DOSED IN MG ELEMENTAL CALCIUM) 500 MG chewable tablet Chew 2 tablets by mouth 3 (three) times daily as needed for indigestion or heartburn.  . clobetasol cream (TEMOVATE) 0.05 % Apply 1 application topically 2 (two) times daily. (Patient not taking: No sig reported)  . ferrous sulfate (FERROUSUL) 325 (65 FE) MG tablet Take 1 tablet (325 mg total) by mouth daily with breakfast. (Patient not taking: No sig reported)   No facility-administered encounter medications on file as of 04/13/2020.     ROS: Pertinent positives and negatives noted in HPI. Remainder of ROS non-contributory    Allergies  Allergen Reactions  . Codeine Nausea And Vomiting and Other (See Comments)    Severe stomach cramps  . Antihistamines, Diphenhydramine-Type Other (See Comments)    Causes hyperactivity  . Gabapentin     Urinary incontinence  . Statins Other (See Comments)    Joint pains  . Sulfa Antibiotics     Unknown to pt  . Erythromycin Hives and Rash    Due to dental work about 50 years ago. Was used in packing and resulted in rash/hives inside and outside of mouth.    BP 138/80 (BP Location: Left Arm, Patient Position: Sitting, Cuff Size: Normal)   Pulse 87   Temp (!) 97.1 F (36.2 C) (Temporal)   Ht 5\' 3"  (1.6 m)   Wt 146 lb 9.6 oz (66.5 kg)   SpO2 97%   BMI 25.97 kg/m   Wt Readings from Last 3 Encounters:  04/13/20 146 lb 9.6 oz (66.5 kg)  03/30/20 143 lb (64.9 kg)  03/17/20 140 lb (63.5 kg)   Temp Readings from Last 3 Encounters:  04/13/20 (!) 97.1 F (36.2 C)  (Temporal)  03/30/20 97.6 F (36.4 C) (Skin)  03/17/20 (!) 97.5 F (36.4 C) (Oral)   BP Readings from Last 3 Encounters:  04/13/20 138/80  03/30/20 (!) 166/76  03/17/20 (!) 165/67   Pulse Readings from Last 3 Encounters:  04/13/20 87  03/30/20 100  03/17/20 76     Physical Exam Constitutional:      General: She is not in acute distress.    Appearance: She is not ill-appearing.  Cardiovascular:     Rate and Rhythm: Normal rate and regular rhythm.     Pulses: Normal pulses.     Heart sounds: Murmur (2/6 holosystolic murmur) heard.    Pulmonary:     Effort: Pulmonary effort is normal. No respiratory distress.     Breath sounds: No stridor. No wheezing, rhonchi or rales.  Musculoskeletal:     Right lower leg: No edema.     Left lower leg: No edema.  Neurological:     Mental Status: She is alert.      A/P:  1. Community acquired pneumonia, unspecified laterality - treated with 7 day course of levaquin - pt feels back to baseline - vitals, including pulse ox, look good/stable  2. Coronary artery disease involving native coronary artery of native heart without angina pectoris - f/u with cardio as scheduled on 3/28 and proceed with CABG per cardio  - cont current meds    This visit occurred during the SARS-CoV-2 public health emergency.  Safety  protocols were in place, including screening questions prior to the visit, additional usage of staff PPE, and extensive cleaning of exam room while observing appropriate contact time as indicated for disinfecting solutions.

## 2020-04-18 ENCOUNTER — Other Ambulatory Visit: Payer: Self-pay | Admitting: *Deleted

## 2020-04-18 ENCOUNTER — Ambulatory Visit
Admission: RE | Admit: 2020-04-18 | Discharge: 2020-04-18 | Disposition: A | Discharge status: Home / Self Care | Payer: Medicare Other | Source: Ambulatory Visit | Attending: Thoracic Surgery (Cardiothoracic Vascular Surgery) | Admitting: Thoracic Surgery (Cardiothoracic Vascular Surgery)

## 2020-04-18 ENCOUNTER — Other Ambulatory Visit: Payer: Self-pay

## 2020-04-18 ENCOUNTER — Ambulatory Visit (INDEPENDENT_AMBULATORY_CARE_PROVIDER_SITE_OTHER): Payer: Medicare Other | Admitting: Thoracic Surgery (Cardiothoracic Vascular Surgery)

## 2020-04-18 ENCOUNTER — Encounter: Payer: Self-pay | Admitting: Thoracic Surgery (Cardiothoracic Vascular Surgery)

## 2020-04-18 VITALS — BP 155/72 | HR 97 | Resp 20 | Ht 63.0 in | Wt 148.0 lb

## 2020-04-18 DIAGNOSIS — I2510 Atherosclerotic heart disease of native coronary artery without angina pectoris: Secondary | ICD-10-CM

## 2020-04-18 DIAGNOSIS — I350 Nonrheumatic aortic (valve) stenosis: Secondary | ICD-10-CM

## 2020-04-18 NOTE — Patient Instructions (Signed)
   Continue taking all current medications without change through the day before surgery.  Make sure to bring all of your medications with you when you come for your Pre-Admission Testing appointment at [facility] Short-Stay Department.  Have nothing to eat or drink after midnight the night before surgery.  On the morning of surgery take only Synthroid and Protonix with a sip of water.  At your appointment for Pre-Admission Testing at the [facility] Short-Stay Department you will be asked to sign permission forms for your upcoming surgery.  By definition your signature on these forms implies that you and/or your designee provide full informed consent for your planned surgical procedure(s), that alternative treatment options have been discussed, that you understand and accept any and all potential risks, and that you have some understanding of what to expect for your post-operative convalescence.  For any major cardiac surgical procedure potential operative risks include but are not limited to at least some risk of death, stroke or other neurologic complication, myocardial infarction, congestive heart failure, respiratory failure, renal failure, bleeding requiring blood transfusion and/or reexploration, irregular heart rhythm, heart block or bradycardia requiring permanent pacemaker, aortic dissection or other major vascular complication, pneumonia, pericardial effusion, pleural effusion, wound infection, pulmonary embolus or other thromboembolic complication, chronic pain, or other complications related to the specific procedure(s) performed.  Please call to schedule a follow-up appointment in our office prior to surgery if you have any unresolved questions about your planned surgical procedure, the associated risks, alternative treatment options, and/or expectations for your post-operative recovery.

## 2020-04-18 NOTE — Progress Notes (Signed)
[address]       [place] [address]             [phone]     CARDIOTHORACIC SURGERY OFFICE NOTE  Referring Provider is Josue Hector, MD PCP is Ronnald Nian, DO   HPI:  Patient is a 80 year old female with history of aortic stenosis, coronary artery disease, hypertension, hyperlipidemia, type 2 diabetes mellitus with complications, peripheral arterial disease with chronic claudication, remote history of tobacco abuse, stage IV chronic kidney disease, anxiety and depression who returns to the office today for follow-up of severe multivessel coronary artery disease and stage D3 severe low-flow low gradient aortic stenosis.  She was originally seen in consultation on March 30, 2020 at which time we made plans for elective aortic valve replacement and coronary artery bypass grafting.  Surgery was scheduled for April 07, 2020 but routine preoperative chest radiograph revealed evidence of community-acquired pneumonia particularly involving the right middle lobe.  Covid testing was negative but the patient's white blood count was slightly elevated.  She was treated with a 7-week course of oral Levaquin and returns her office today for follow-up.  She states that she feels much better and she is relieved that we did not proceed with her surgery.  She now understands that a lot of her exacerbation of symptoms in early March were related to her pneumonia despite the fact that she did not have any fevers or productive cough.  She states that she is now sort of back to her baseline with stable mild exertional shortness of breath.  She is not having any chest pain or chest tightness.  Appetite is normal.  She has had no fevers or chills.  She has never had a productive cough.  Energy level is back to normal.  She hopes to proceed with surgery in the near future.   Current Outpatient Medications  Medication Sig Dispense Refill  . Accu-Chek FastClix Lancets MISC USE TO CHECK  BLOOD SUGARS 3 TIMES DAILY 306 each 3  . acetaminophen (TYLENOL) 500 MG tablet Take 1,000 mg by mouth every 6 (six) hours as needed for moderate pain or headache.    Marland Kitchen aspirin EC 81 MG tablet Take 81 mg by mouth at bedtime.    . calcium carbonate (TUMS - DOSED IN MG ELEMENTAL CALCIUM) 500 MG chewable tablet Chew 2 tablets by mouth 3 (three) times daily as needed for indigestion or heartburn.    . Cholecalciferol (VITAMIN D3) 25 MCG (1000 UT) CAPS Take 1,000 Units by mouth daily before lunch.    . clobetasol cream (TEMOVATE) 5.88 % Apply 1 application topically 2 (two) times daily. 30 g 0  . furosemide (LASIX) 20 MG tablet Take 1 tablet (20 mg total) by mouth every Monday, Wednesday, and Friday. 90 tablet 3  . glipiZIDE (GLUCOTROL XL) 10 MG 24 hr tablet Take 1 tablet (10 mg total) by mouth daily at 12 noon. 90 tablet 1  . glucose blood (ACCU-CHEK GUIDE) test strip TEST ONCE DAILY 100 each 5  . Golimumab (SIMPONI ARIA IV) Inject 1 Dose into the vein every 8 (eight) weeks.    Marland Kitchen levothyroxine (SYNTHROID) 88 MCG tablet Take 1 tablet (88 mcg total) by mouth daily. 90 tablet 3  . losartan (COZAAR) 50 MG tablet Take 1 tablet (50 mg total) by mouth daily. 90 tablet 0  . Magnesium 250 MG TABS Take 250 mg by mouth at bedtime.    . melatonin 3  MG TABS tablet Take 3 mg by mouth at bedtime.    . metFORMIN (GLUCOPHAGE) 500 MG tablet Take 1 tablet (500 mg total) by mouth daily with breakfast. 90 tablet 3  . nitroGLYCERIN (NITROSTAT) 0.4 MG SL tablet Place 1 tablet (0.4 mg total) under the tongue every 5 (five) minutes as needed for chest pain. 25 tablet 3  . Omega-3 Fatty Acids (FISH OIL) 1200 MG CAPS Take 1,200 mg by mouth in the morning and at bedtime.    . pantoprazole (PROTONIX) 40 MG tablet Take 1 tablet (40 mg total) by mouth daily. 90 tablet 3  . PARoxetine (PAXIL) 20 MG tablet Take 1 tablet (20 mg total) by mouth in the morning. 90 tablet 3  . pioglitazone (ACTOS) 30 MG tablet Take 1 tablet (30 mg total)  by mouth daily. (Patient taking differently: Take 30 mg by mouth at bedtime.) 90 tablet 1  . sitaGLIPtin (JANUVIA) 100 MG tablet Take 1 tablet (100 mg total) by mouth daily. (Patient taking differently: Take 100 mg by mouth at bedtime.) 90 tablet 3  . valACYclovir (VALTREX) 1000 MG tablet TAKE 1 TABLET BY MOUTH 2  TIMES DAILY AS NEEDED FOR  VIRAL INFECTION. (Patient taking differently: Take 1,000 mg by mouth daily as needed (Fever Blisters).) 180 tablet 0  . vitamin B-12 (CYANOCOBALAMIN) 500 MCG tablet Take 1,000 mcg by mouth daily with lunch.     No current facility-administered medications for this visit.      Physical Exam:   BP (!) 155/72 (BP Location: Left Arm, Patient Position: Sitting)   Pulse 97   Resp 20   Ht 5\' 3"  (1.6 m)   Wt 148 lb (67.1 kg)   SpO2 96% Comment: RA  BMI 26.22 kg/m   General:  Well-appearing  Chest:   Clear to auscultation with exception of a few inspiratory crackles and rhonchi right anterior mid chest.  Breath sounds are otherwise symmetrical and clear.  CV:   Regular rate and rhythm with grade 3/6 crescendo decrescendo systolic murmur heard best at the right upper sternal border  Incisions:  n/a  Abdomen:  Soft nontender  Extremities:  Warm and well-perfused  Diagnostic Tests:  CHEST - 2 VIEW  COMPARISON:  04/01/2020, CT 03/31/2020  FINDINGS: Partial clearing of right greater than left pneumonia with mild residual airspace disease most notable on the right side. Stable cardiomediastinal silhouette with aortic atherosclerosis. No pneumothorax. Clips over the right chest.  IMPRESSION: Partial clearing of right greater than left pneumonia with mild residual airspace disease on the right.   Electronically Signed   By: Donavan Foil M.D.   On: 04/18/2020 16:03    Impression:  Resolving community-acquired pneumonia.  She describes improved breathing which she states is back to her baseline stable symptoms of exertional shortness of  breath and fatigue consistent with chronic diastolic congestive heart failure, New York Heart Association functional class II.  I have personally reviewed the patient's follow-up chest x-ray performed earlier today which demonstrates clearing of the right middle lobe pneumonia.   Plan:  We discussed timing of surgical intervention and tentatively plan to proceed with surgery on May 04, 2020.  The patient will undergo routine preoperative testing including another follow-up chest x-ray on May 02, 2020.  We again briefly reviewed the indications, risk, and potential benefits of elective aortic valve replacement and coronary artery bypass grafting.  Expectations for the patient's postoperative convalescence were discussed.  All of her questions have been answered.    I  spent in excess of 15 minutes during the conduct of this office consultation and >50% of this time involved direct face-to-face encounter with the patient for counseling and/or coordination of their care.    Valentina Gu. Roxy Manns, MD 04/18/2020 4:26 PM

## 2020-04-19 ENCOUNTER — Encounter: Payer: Self-pay | Admitting: *Deleted

## 2020-04-22 ENCOUNTER — Encounter: Payer: Self-pay | Admitting: Nurse Practitioner

## 2020-04-22 ENCOUNTER — Other Ambulatory Visit: Payer: Self-pay

## 2020-04-22 ENCOUNTER — Ambulatory Visit (INDEPENDENT_AMBULATORY_CARE_PROVIDER_SITE_OTHER): Payer: Medicare Other | Admitting: Nurse Practitioner

## 2020-04-22 VITALS — BP 154/82 | HR 84 | Temp 97.0°F | Ht 63.0 in | Wt 147.4 lb

## 2020-04-22 DIAGNOSIS — R8290 Unspecified abnormal findings in urine: Secondary | ICD-10-CM

## 2020-04-22 DIAGNOSIS — K5904 Chronic idiopathic constipation: Secondary | ICD-10-CM

## 2020-04-22 DIAGNOSIS — R350 Frequency of micturition: Secondary | ICD-10-CM | POA: Diagnosis not present

## 2020-04-22 LAB — POCT URINALYSIS DIPSTICK
Bilirubin, UA: NEGATIVE
Glucose, UA: NEGATIVE
Ketones, UA: NEGATIVE
Nitrite, UA: NEGATIVE
Protein, UA: POSITIVE — AB
Spec Grav, UA: 1.02 (ref 1.010–1.025)
Urobilinogen, UA: 0.2 E.U./dL
pH, UA: 6 (ref 5.0–8.0)

## 2020-04-22 MED ORDER — SENNA-DOCUSATE SODIUM 8.6-50 MG PO TABS: 1.0000 | ORAL_TABLET | Freq: Every day | ORAL | 0 refills | Status: DC

## 2020-04-22 MED ORDER — NITROFURANTOIN MONOHYD MACRO 100 MG PO CAPS: 100.0000 mg | ORAL_CAPSULE | Freq: Two times a day (BID) | ORAL | 0 refills | Status: DC

## 2020-04-22 NOTE — Patient Instructions (Signed)
Increase oral hydration and daily dietary fiber intake.  You urine was sent for culture You will be called with results.  Constipation, Adult Constipation is when a person has trouble pooping (having a bowel movement). When you have this condition, you may poop fewer than 3 times a week. Your poop (stool) may also be dry, hard, or bigger than normal. Follow these instructions at home: Eating and drinking  Eat foods that have a lot of fiber, such as: ? Fresh fruits and vegetables. ? Whole grains. ? Beans.  Eat less of foods that are low in fiber and high in fat and sugar, such as: ? [place] fries. ? Hamburgers. ? Cookies. ? Candy. ? Soda.  Drink enough fluid to keep your pee (urine) pale yellow.   General instructions  Exercise regularly or as told by your doctor. Try to do 150 minutes of exercise each week.  Go to the restroom when you feel like you need to poop. Do not hold it in.  Take over-the-counter and prescription medicines only as told by your doctor. These include any fiber supplements.  When you poop: ? Do deep breathing while relaxing your lower belly (abdomen). ? Relax your pelvic floor. The pelvic floor is a group of muscles that support the rectum, bladder, and intestines (as well as the uterus in women).  Watch your condition for any changes. Tell your doctor if you notice any.  Keep all follow-up visits as told by your doctor. This is important. Contact a doctor if:  You have pain that gets worse.  You have a fever.  You have not pooped for 4 days.  You vomit.  You are not hungry.  You lose weight.  You are bleeding from the opening of the butt (anus).  You have thin, pencil-like poop. Get help right away if:  You have a fever, and your symptoms suddenly get worse.  You leak poop or have blood in your poop.  Your belly feels hard or bigger than normal (bloated).  You have very bad belly pain.  You feel dizzy or you  faint. Summary  Constipation is when a person poops fewer than 3 times a week, has trouble pooping, or has poop that is dry, hard, or bigger than normal.  Eat foods that have a lot of fiber.  Drink enough fluid to keep your pee (urine) pale yellow.  Take over-the-counter and prescription medicines only as told by your doctor. These include any fiber supplements. This information is not intended to replace advice given to you by your health care provider. Make sure you discuss any questions you have with your health care provider. Document Revised: 11/26/2018 Document Reviewed: 11/26/2018 Elsevier Patient Education  [employer].

## 2020-04-22 NOTE — Progress Notes (Signed)
Subjective:  Patient ID: [name], female    DOB: 03/08/1940  Age: 80 y.o. MRN: [id number]  CC: Acute Visit (Pt c/o urinary frequency with a little output, along with pressure. )  Urinary Frequency  This is a new problem. The current episode started in the past 7 days. The problem occurs every urination. The problem has been unchanged. The quality of the pain is described as burning. The pain is mild. There has been no fever. She is not sexually active. There is no history of pyelonephritis. Associated symptoms include frequency and urgency. Pertinent negatives include no chills, discharge, flank pain, hematuria, hesitancy, nausea, possible pregnancy, sweats or vomiting. She has tried nothing for the symptoms. Her past medical history is significant for urinary stasis. There is no history of catheterization, kidney stones, recurrent UTIs, a single kidney or a urological procedure.  hx of constipation, last BM this morning (pellets) Admits to inadequate oral hydration and dietary fiber intake Denies any ABD pain or nausea or rectal pain or blood in stool.  Reviewed past Medical, Social and Family history today.  Outpatient Medications Prior to Visit  Medication Sig Dispense Refill  . Accu-Chek FastClix Lancets MISC USE TO CHECK BLOOD SUGARS 3 TIMES DAILY 306 each 3  . acetaminophen (TYLENOL) 500 MG tablet Take 1,000 mg by mouth every 6 (six) hours as needed for moderate pain or headache.    Marland Kitchen aspirin EC 81 MG tablet Take 81 mg by mouth at bedtime.    . calcium carbonate (TUMS - DOSED IN MG ELEMENTAL CALCIUM) 500 MG chewable tablet Chew 2 tablets by mouth 3 (three) times daily as needed for indigestion or heartburn.    . Cholecalciferol (VITAMIN D3) 25 MCG (1000 UT) CAPS Take 1,000 Units by mouth daily before lunch.    . furosemide (LASIX) 20 MG tablet Take 1 tablet (20 mg total) by mouth every Monday, Wednesday, and Friday. 90 tablet 3  . glipiZIDE (GLUCOTROL XL) 10 MG 24 hr tablet Take  1 tablet (10 mg total) by mouth daily at 12 noon. 90 tablet 1  . glucose blood (ACCU-CHEK GUIDE) test strip TEST ONCE DAILY 100 each 5  . Golimumab (SIMPONI ARIA IV) Inject 1 Dose into the vein every 8 (eight) weeks.    Marland Kitchen levothyroxine (SYNTHROID) 88 MCG tablet Take 1 tablet (88 mcg total) by mouth daily. 90 tablet 3  . losartan (COZAAR) 50 MG tablet Take 1 tablet (50 mg total) by mouth daily. 90 tablet 0  . Magnesium 250 MG TABS Take 250 mg by mouth at bedtime.    . melatonin 3 MG TABS tablet Take 3 mg by mouth at bedtime.    . metFORMIN (GLUCOPHAGE) 500 MG tablet Take 1 tablet (500 mg total) by mouth daily with breakfast. 90 tablet 3  . nitroGLYCERIN (NITROSTAT) 0.4 MG SL tablet Place 1 tablet (0.4 mg total) under the tongue every 5 (five) minutes as needed for chest pain. 25 tablet 3  . Omega-3 Fatty Acids (FISH OIL) 1200 MG CAPS Take 1,200 mg by mouth in the morning and at bedtime.    . pantoprazole (PROTONIX) 40 MG tablet Take 1 tablet (40 mg total) by mouth daily. 90 tablet 3  . PARoxetine (PAXIL) 20 MG tablet Take 1 tablet (20 mg total) by mouth in the morning. 90 tablet 3  . pioglitazone (ACTOS) 30 MG tablet Take 1 tablet (30 mg total) by mouth daily. (Patient taking differently: Take 30 mg by mouth at bedtime.) 90 tablet  1  . sitaGLIPtin (JANUVIA) 100 MG tablet Take 1 tablet (100 mg total) by mouth daily. (Patient taking differently: Take 100 mg by mouth at bedtime.) 90 tablet 3  . valACYclovir (VALTREX) 1000 MG tablet TAKE 1 TABLET BY MOUTH 2  TIMES DAILY AS NEEDED FOR  VIRAL INFECTION. (Patient taking differently: Take 1,000 mg by mouth daily as needed (Fever Blisters).) 180 tablet 0  . vitamin B-12 (CYANOCOBALAMIN) 500 MCG tablet Take 1,000 mcg by mouth daily with lunch.    . clobetasol cream (TEMOVATE) 0.76 % Apply 1 application topically 2 (two) times daily. (Patient not taking: No sig reported) 30 g 0   No facility-administered medications prior to visit.    ROS See  HPI  Objective:  BP (!) 154/82 (BP Location: Left Arm, Patient Position: Sitting, Cuff Size: Normal)   Pulse 84   Temp (!) 97 F (36.1 C) (Temporal)   Ht 5\' 3"  (1.6 m)   Wt 147 lb 6.4 oz (66.9 kg)   SpO2 98%   BMI 26.11 kg/m   Physical Exam Pulmonary:     Effort: Pulmonary effort is normal.  Abdominal:     General: There is no distension.     Palpations: Abdomen is soft.     Tenderness: There is no abdominal tenderness. There is no right CVA tenderness or left CVA tenderness.  Neurological:     Mental Status: She is alert and oriented to person, place, and time.    Assessment & Plan:  This visit occurred during the SARS-CoV-2 public health emergency.  Safety protocols were in place, including screening questions prior to the visit, additional usage of staff PPE, and extensive cleaning of exam room while observing appropriate contact time as indicated for disinfecting solutions.   [name] was seen today for acute visit.  Diagnoses and all orders for this visit:  Abnormal finding on urinalysis -     Cancel: POCT urine pregnancy -     POCT Urinalysis Dipstick -     Urine Culture -     nitrofurantoin, macrocrystal-monohydrate, (MACROBID) 100 MG capsule; Take 1 capsule (100 mg total) by mouth 2 (two) times daily.  Urinary frequency -     Urine Culture -     nitrofurantoin, macrocrystal-monohydrate, (MACROBID) 100 MG capsule; Take 1 capsule (100 mg total) by mouth 2 (two) times daily.  Chronic idiopathic constipation -     sennosides-docusate sodium (SENOKOT-S) 8.6-50 MG tablet; Take 1 tablet by mouth daily.   Problem List Items Addressed This Visit   None   Visit Diagnoses    Abnormal finding on urinalysis    -  Primary   Relevant Medications   nitrofurantoin, macrocrystal-monohydrate, (MACROBID) 100 MG capsule   Other Relevant Orders   POCT Urinalysis Dipstick (Completed)   Urine Culture   Urinary frequency       Relevant Medications   nitrofurantoin,  macrocrystal-monohydrate, (MACROBID) 100 MG capsule   Other Relevant Orders   Urine Culture   Chronic idiopathic constipation       Relevant Medications   sennosides-docusate sodium (SENOKOT-S) 8.6-50 MG tablet      Follow-up: Return if symptoms worsen or fail to improve.  Wilfred Lacy, NP

## 2020-04-24 LAB — URINE CULTURE
MICRO NUMBER:: 11721346
SPECIMEN QUALITY:: ADEQUATE

## 2020-04-25 ENCOUNTER — Telehealth: Payer: Self-pay | Admitting: *Deleted

## 2020-04-25 ENCOUNTER — Other Ambulatory Visit: Payer: Self-pay | Admitting: *Deleted

## 2020-04-25 ENCOUNTER — Other Ambulatory Visit: Payer: Self-pay | Admitting: Family Medicine

## 2020-04-25 DIAGNOSIS — I350 Nonrheumatic aortic (valve) stenosis: Secondary | ICD-10-CM

## 2020-04-25 DIAGNOSIS — I2510 Atherosclerotic heart disease of native coronary artery without angina pectoris: Secondary | ICD-10-CM

## 2020-04-25 MED ORDER — NITROFURANTOIN MONOHYD MACRO 100 MG PO CAPS: 100.0000 mg | ORAL_CAPSULE | Freq: Two times a day (BID) | ORAL | 0 refills | Status: DC

## 2020-04-25 NOTE — Addendum Note (Signed)
Addended by: Wilfred Lacy L on: 04/25/2020 09:05 AM   Modules accepted: Orders

## 2020-04-25 NOTE — Telephone Encounter (Signed)
[name] called stating she was seen by her PCP on Friday and is being treated for a UTI. Patient is scheduled to have an AVR/CABG on 4/13 by Dr. Roxy Manns. Dr. Roxy Manns made aware. Orders for urine culture added to patient's pre admission testing. Patient instructed to call us if symptoms do not resolve by the end of this week. Patient verbalizes understanding.

## 2020-05-02 ENCOUNTER — Other Ambulatory Visit (HOSPITAL_COMMUNITY)
Admission: RE | Admit: 2020-05-02 | Discharge: 2020-05-02 | Disposition: A | Discharge status: Home / Self Care | Payer: Medicare Other | Source: Ambulatory Visit | Attending: Thoracic Surgery (Cardiothoracic Vascular Surgery) | Admitting: Thoracic Surgery (Cardiothoracic Vascular Surgery)

## 2020-05-02 ENCOUNTER — Ambulatory Visit (HOSPITAL_COMMUNITY)
Admission: RE | Admit: 2020-05-02 | Discharge: 2020-05-02 | Disposition: A | Discharge status: Home / Self Care | Payer: Medicare Other | Source: Ambulatory Visit | Attending: Thoracic Surgery (Cardiothoracic Vascular Surgery) | Admitting: Thoracic Surgery (Cardiothoracic Vascular Surgery)

## 2020-05-02 ENCOUNTER — Ambulatory Visit (INDEPENDENT_AMBULATORY_CARE_PROVIDER_SITE_OTHER): Payer: Medicare Other | Admitting: Thoracic Surgery (Cardiothoracic Vascular Surgery)

## 2020-05-02 ENCOUNTER — Ambulatory Visit (HOSPITAL_BASED_OUTPATIENT_CLINIC_OR_DEPARTMENT_OTHER)
Admission: RE | Admit: 2020-05-02 | Discharge: 2020-05-02 | Disposition: A | Discharge status: Home / Self Care | Payer: Medicare Other | Source: Ambulatory Visit | Attending: Thoracic Surgery (Cardiothoracic Vascular Surgery) | Admitting: Thoracic Surgery (Cardiothoracic Vascular Surgery)

## 2020-05-02 ENCOUNTER — Other Ambulatory Visit: Payer: Self-pay

## 2020-05-02 ENCOUNTER — Encounter (HOSPITAL_COMMUNITY)
Admission: RE | Admit: 2020-05-02 | Discharge: 2020-05-02 | Disposition: A | Discharge status: Home / Self Care | Payer: Medicare Other | Source: Ambulatory Visit | Attending: Thoracic Surgery (Cardiothoracic Vascular Surgery) | Admitting: Thoracic Surgery (Cardiothoracic Vascular Surgery)

## 2020-05-02 ENCOUNTER — Encounter (HOSPITAL_COMMUNITY): Payer: Self-pay

## 2020-05-02 ENCOUNTER — Encounter: Payer: Self-pay | Admitting: Thoracic Surgery (Cardiothoracic Vascular Surgery)

## 2020-05-02 VITALS — BP 149/72 | HR 96 | Resp 20 | Ht 63.0 in

## 2020-05-02 DIAGNOSIS — R918 Other nonspecific abnormal finding of lung field: Secondary | ICD-10-CM | POA: Insufficient documentation

## 2020-05-02 DIAGNOSIS — I130 Hypertensive heart and chronic kidney disease with heart failure and stage 1 through stage 4 chronic kidney disease, or unspecified chronic kidney disease: Secondary | ICD-10-CM | POA: Diagnosis not present

## 2020-05-02 DIAGNOSIS — Z01818 Encounter for other preprocedural examination: Secondary | ICD-10-CM | POA: Insufficient documentation

## 2020-05-02 DIAGNOSIS — I350 Nonrheumatic aortic (valve) stenosis: Secondary | ICD-10-CM | POA: Insufficient documentation

## 2020-05-02 DIAGNOSIS — Z20822 Contact with and (suspected) exposure to covid-19: Secondary | ICD-10-CM | POA: Insufficient documentation

## 2020-05-02 DIAGNOSIS — N184 Chronic kidney disease, stage 4 (severe): Secondary | ICD-10-CM | POA: Diagnosis not present

## 2020-05-02 DIAGNOSIS — I2510 Atherosclerotic heart disease of native coronary artery without angina pectoris: Secondary | ICD-10-CM

## 2020-05-02 DIAGNOSIS — E785 Hyperlipidemia, unspecified: Secondary | ICD-10-CM | POA: Insufficient documentation

## 2020-05-02 DIAGNOSIS — Z951 Presence of aortocoronary bypass graft: Secondary | ICD-10-CM | POA: Insufficient documentation

## 2020-05-02 DIAGNOSIS — I10 Essential (primary) hypertension: Secondary | ICD-10-CM | POA: Insufficient documentation

## 2020-05-02 DIAGNOSIS — Z006 Encounter for examination for normal comparison and control in clinical research program: Secondary | ICD-10-CM | POA: Diagnosis not present

## 2020-05-02 DIAGNOSIS — E119 Type 2 diabetes mellitus without complications: Secondary | ICD-10-CM | POA: Insufficient documentation

## 2020-05-02 HISTORY — DX: Rheumatoid arthritis, unspecified: M06.9

## 2020-05-02 LAB — CBC
HCT: 38.7 % (ref 36.0–46.0)
Hemoglobin: 12.5 g/dL (ref 12.0–15.0)
MCH: 29 pg (ref 26.0–34.0)
MCHC: 32.3 g/dL (ref 30.0–36.0)
MCV: 89.8 fL (ref 80.0–100.0)
Platelets: 198 10*3/uL (ref 150–400)
RBC: 4.31 MIL/uL (ref 3.87–5.11)
RDW: 14.5 % (ref 11.5–15.5)
WBC: 9.6 10*3/uL (ref 4.0–10.5)
nRBC: 0 % (ref 0.0–0.2)

## 2020-05-02 LAB — COMPREHENSIVE METABOLIC PANEL
ALT: 15 U/L (ref 0–44)
AST: 21 U/L (ref 15–41)
Albumin: 3.6 g/dL (ref 3.5–5.0)
Alkaline Phosphatase: 61 U/L (ref 38–126)
Anion gap: 10 (ref 5–15)
BUN: 32 mg/dL — ABNORMAL HIGH (ref 8–23)
CO2: 20 mmol/L — ABNORMAL LOW (ref 22–32)
Calcium: 9.5 mg/dL (ref 8.9–10.3)
Chloride: 105 mmol/L (ref 98–111)
Creatinine, Ser: 1.65 mg/dL — ABNORMAL HIGH (ref 0.44–1.00)
GFR, Estimated: 32 mL/min — ABNORMAL LOW (ref >60)
Glucose, Bld: 164 mg/dL — ABNORMAL HIGH (ref 70–99)
Potassium: 4.3 mmol/L (ref 3.5–5.1)
Sodium: 135 mmol/L (ref 135–145)
Total Bilirubin: 0.8 mg/dL (ref 0.3–1.2)
Total Protein: 7.4 g/dL (ref 6.5–8.1)

## 2020-05-02 LAB — SURGICAL PCR SCREEN
MRSA, PCR: NEGATIVE
Staphylococcus aureus: NEGATIVE

## 2020-05-02 LAB — BLOOD GAS, ARTERIAL
Acid-base deficit: 1.3 mmol/L (ref 0.0–2.0)
Bicarbonate: 22.7 mmol/L (ref 20.0–28.0)
Drawn by: 58793
FIO2: 21
O2 Saturation: 96.6 %
Patient temperature: 37
pCO2 arterial: 36.8 mmHg (ref 32.0–48.0)
pH, Arterial: 7.407 (ref 7.350–7.450)
pO2, Arterial: 90.2 mmHg (ref 83.0–108.0)

## 2020-05-02 LAB — URINALYSIS, ROUTINE W REFLEX MICROSCOPIC
Bilirubin Urine: NEGATIVE
Glucose, UA: NEGATIVE mg/dL
Hgb urine dipstick: NEGATIVE
Ketones, ur: NEGATIVE mg/dL
Leukocytes,Ua: NEGATIVE
Nitrite: NEGATIVE
Protein, ur: NEGATIVE mg/dL
Specific Gravity, Urine: 1.008 (ref 1.005–1.030)
pH: 6 (ref 5.0–8.0)

## 2020-05-02 LAB — PROTIME-INR
INR: 1 (ref 0.8–1.2)
Prothrombin Time: 12.8 seconds (ref 11.4–15.2)

## 2020-05-02 LAB — HEMOGLOBIN A1C
Hgb A1c MFr Bld: 8.1 % — ABNORMAL HIGH (ref 4.8–5.6)
Mean Plasma Glucose: 185.77 mg/dL

## 2020-05-02 LAB — GLUCOSE, CAPILLARY: Glucose-Capillary: 185 mg/dL — ABNORMAL HIGH (ref 70–99)

## 2020-05-02 LAB — SARS CORONAVIRUS 2 (TAT 6-24 HRS): SARS Coronavirus 2: NEGATIVE

## 2020-05-02 LAB — APTT: aPTT: 27 seconds (ref 24–36)

## 2020-05-02 NOTE — Patient Instructions (Signed)
Continue taking all current medications without change through the day before surgery.  Make sure to bring all of your medications with you when you come for your Pre-Admission Testing appointment at [facility].  Have nothing to eat or drink after midnight the night before surgery.  On the morning of surgery take only Synthroid and Protonix with a sip of water.

## 2020-05-02 NOTE — Progress Notes (Signed)
PCP - Dr. Bryan Lemma  Cardiologist - Dr.Nishan  Chest x-ray - today EKG - 05/02/20 Stress Test - 11/22/15 ECHO - 02/29/20 Cardiac Cath - 03/17/20  Fasting Blood Sugar - 120-150 Checks Blood Sugar __1___ times a day  Aspirin Instructions: continue taking, do not take day of surgery  COVID TEST- done today   Anesthesia review: yes, diabetes  Patient denies shortness of breath, fever, cough and chest pain at PAT appointment   All instructions explained to the patient, with a verbal understanding of the material. Patient agrees to go over the instructions while at home for a better understanding. Patient also instructed to self quarantine after being tested for COVID-19. The opportunity to ask questions was provided.

## 2020-05-02 NOTE — Progress Notes (Signed)
[address]       [place] [address]             [phone]     CARDIOTHORACIC SURGERY CONSULTATION REPORT  Referring Provider is Josue Hector, MD PCP is Ronnald Nian, DO  Chief Complaint  Patient presents with  . Aortic Stenosis    AVR/CABG scheduled 4/13  . Coronary Artery Disease    HPI:  Patient is a 80 year old female with history of aortic stenosis, coronary artery disease, hypertension, hyperlipidemia, type 2 diabetes mellitus with complications, peripheral arterial disease with chronic claudication, remote history of tobacco abuse, stage IV chronic kidney disease, anxiety and depression who returns to the office for management of aortic stenosis and multivessel coronary artery disease.  Patient's cardiac history dates back to 2007 when she underwent PCI and stenting of the right coronary artery.  She reportedly did not have a heart attack at that time.  For the last several years she has been followed intermittently by Dr. Johnsie Cancel.  Nuclear stress test in 2017 revealed no ischemia.  Echocardiograms have documented the presence of normal left ventricular systolic function with aortic stenosis and mitral regurgitation that has gradually progressed in severity.  Over the last 2 to 3 months the patient has developed symptoms of exertional shortness of breath with vague choking sensation in her throat.  Symptoms have always been associated with physical exertion and are promptly relieved by rest, although they may occur with relatively low level activity.  She has not had any resting shortness of breath or chest discomfort.  She denies any history of PND, orthopnea, or lower extremity edema.  She has never had any dizzy spells or syncope.  She was seen urgently in the office by Dr. Johney Frame and transthoracic echocardiogram performed February 29, 2020 revealed progression in the severity of the patient's aortic valve disease with at least moderate and  probably stage D3 severe symptomatic aortic stenosis.  Left ventricular systolic function remain normal with ejection fraction estimated 65 to 70%.  There was moderate mitral regurgitation which appeared to be secondary (functional) and somewhat worse than previous echocardiograms.  The patient was referred to the multidisciplinary heart valve clinic and has been evaluated previously by Dr. Angelena Form.  Diagnostic cardiac catheterization was performed March 17, 2020.  Catheterization confirmed the presence of aortic stenosis with peak to peak and mean transvalvular gradients measured 34 and 26.9 mmHg, respectively.  There was severe multivessel coronary artery disease with long segment severe stenosis of the mid left anterior descending coronary artery and high-grade ostial stenosis of the right coronary artery with continued patency in the distal segment of the right coronary artery.  Cardiac output remain normal.  There was mild to moderate pulmonary hypertension.  Patient was referred for surgical consultation.  Patient is married and lives locally in [place].  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  She has remained reasonably active and functionally independent throughout retirement.  She enjoys working in her garden and Medical laboratory scientific officer vegetables.  She does not exercise on a regular basis.  She does not report several year history of chronic bilateral calf claudication.  Symptoms occur in both lower legs, typically with ambulating somewhere near 100 feet distance.  Symptoms are always relieved by rest.  Over the past 2 to 3 months the patient has developed progressive exertional shortness of breath with vague choking sensation in the neck.  She has  not had any chest pain or chest tightness.  She has not had any resting shortness of breath or resting symptoms of discomfort in her neck.  She denies any PND, orthopnea, or lower extremity edema.  She has not had  palpitations, dizzy spells, nor syncope.  Patient was originally seen in consultation on March 30, 2020 at which time we made plans for elective aortic valve replacement and coronary artery bypass grafting.  Surgery was scheduled for April 07, 2020 but routine preoperative chest radiograph revealed evidence of community-acquired pneumonia particularly involving the right middle lobe.  Covid testing was negative but the patient's white blood count was slightly elevated.  She was treated with a 7-week course of oral Levaquin and was last seen here in follow-up on April 18, 2020 at which time she was doing very well clinically and her chest x-ray appeared much improved.  She returns the office today for follow-up with tentative plans to proceed with elective aortic valve replacement and coronary artery bypass grafting on May 04, 2020.  She states that she is now completely back to normal.  She states that she only gets short of breath with more strenuous physical exertion.  Appetite is normal.  She has not had any fevers or chills.  She does not have any cough.  Past Medical History:  Diagnosis Date  . Allergy   . Anemia    childhood  . Anxiety   . Aortic stenosis   . Arterial stenosis (HCC)    mesenteric  . Arthritis   . Breast cancer ([facility]) 12/05/10   R breast, inv mammary, in situ,ER/PR +,HER2 -  . Cancer ([facility])   . Carcinoma of breast treated with adjuvant chemotherapy ([facility])   . 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[facility])   . Claudication ([facility])   . Coronary artery disease    stent 2007  . Depression   . Diabetes mellitus   . Difficulty sleeping   . Diverticulosis 12/10/03  . Elevated cholesterol   . Generalized weakness   . GERD (gastroesophageal reflux disease)   . Heart murmur   . Hepatitis    as an infant  . HSV-1 (herpes simplex virus 1) infection   . Hyperlipidemia   . Hypertension   . Hypothyroidism   . Osteoarthritis   . PAD (peripheral artery disease) ([facility])   .  Personal history of chemotherapy   . Personal history of radiation therapy   . Pneumonia    2014 september and March 2022  . Rheumatoid arthritis ([facility])   . 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 Spinal stenosis   . Thyroid disease     Past Surgical History:  Procedure Laterality Date  . ABDOMINAL AORTOGRAM W/LOWER EXTREMITY Bilateral 04/29/2019   Procedure: ABDOMINAL AORTOGRAM W/LOWER EXTREMITY;  Surgeon: Marty Heck, MD;  Location: [facility];  Service: Cardiovascular;  Laterality: Bilateral;  . ABDOMINAL AORTOGRAM W/LOWER EXTREMITY Left 07/01/2019   Procedure: ABDOMINAL AORTOGRAM W/ Left LOWER EXTREMITY [place];  Surgeon: Marty Heck, MD;  Location: [facility];  Service: Cardiovascular;  Laterality: Left;  . ANTERIOR AND POSTERIOR REPAIR N/A 04/05/2016   Procedure: ANTERIOR (CYSTOCELE) AND POSTERIOR REPAIR (RECTOCELE);  Surgeon: [name], MD;  Location: [facility];  Service: Gynecology;  Laterality: N/A;  . BREAST BIOPSY    . BREAST LUMPECTOMY  1983   benign biopsy  . BREAST LUMPECTOMY Right 12/29/2010   snbx, ER/PR +, Her2 -,  0/1 node pos.  . CARPAL TUNNEL RELEASE Bilateral 9/12,8,12  . CHOLECYSTECTOMY    . COLONOSCOPY N/A 02/09/2013   Procedure: COLONOSCOPY;  Surgeon: [name], MD;  Location: [facility];  Service: Endoscopy;  Laterality: N/A;  . COLONOSCOPY    . CORONARY ANGIOPLASTY    . DILATION AND CURETTAGE OF UTERUS    . ESOPHAGOGASTRODUODENOSCOPY N/A 02/09/2013   Procedure: ESOPHAGOGASTRODUODENOSCOPY (EGD);  Surgeon: [name], MD;  Location: [name];  Service: Endoscopy;  Laterality: N/A;  . FOOT SPURS    . HYSTEROSCOPY WITH D & C N/A 09/11/2012   Procedure: DILATATION AND CURETTAGE ;  Surgeon: [name], MD;  Location: [facility];  Service: Gynecology;  Laterality: N/A;  . KNEE SURGERY  2007  . LAPAROSCOPIC ASSISTED VAGINAL HYSTERECTOMY N/A 04/05/2016   Procedure: LAPAROSCOPIC ASSISTED VAGINAL HYSTERECTOMY  possible BSO;  Surgeon: [name], MD;  Location: [facility];  Service: Gynecology;  Laterality: N/A;  . LUMBAR LAMINECTOMY/DECOMPRESSION MICRODISCECTOMY N/A 10/20/2014   Procedure: MICRO LUMBER DECOMPRESSION L3-4 L4-5;  Surgeon: [name], MD;  Location: [facility];  Service: Orthopedics;  Laterality: N/A;  . LUMBAR LAMINECTOMY/DECOMPRESSION MICRODISCECTOMY Bilateral 10/13/2015   Procedure: MICRO LUMBAR DECOMPRESSION L5 - S1 AND REDO DECOMPRESSION L4 - L5 AND REMOVAL OF FACET CYST L4 - L5 2 LEVELS;  Surgeon: [name], MD;  Location: [facility];  Service: Orthopedics;  Laterality: Bilateral;  . PERIPHERAL VASCULAR INTERVENTION Right 04/29/2019   Procedure: PERIPHERAL VASCULAR INTERVENTION;  Surgeon: Marty Heck, MD;  Location: [facility];  Service: Cardiovascular;  Laterality: Right;  . PORT-A-CATH REMOVAL  04/17/2011   Procedure: REMOVAL PORT-A-CATH;  Surgeon: [name], MD;  Location: [facility];  Service: General;  Laterality: Left;  . PORTACATH PLACEMENT  02/07/2011   Procedure: INSERTION PORT-A-CATH;  Surgeon: [name], MD;  Location: [facility];  Service: General;  Laterality: N/A;  . RIGHT/LEFT HEART CATH AND CORONARY ANGIOGRAPHY N/A 03/17/2020   Procedure: RIGHT/LEFT HEART CATH AND CORONARY ANGIOGRAPHY;  Surgeon: Burnell Blanks, MD;  Location: [facility];  Service: Cardiovascular;  Laterality: N/A;  . UPPER GASTROINTESTINAL ENDOSCOPY      Family History  Problem Relation Age of Onset  . Throat cancer Father   . Alcoholism Father   . Heart attack Father   . Heart disease Mother   . Lymphoma Brother 43  . Cancer Son   . Heart attack Brother   . Diabetes Brother   . Hypertension Brother   . Colon cancer Neg Hx   . Stroke Neg Hx   . Esophageal cancer Neg Hx   . Rectal cancer Neg Hx   . Stomach cancer Neg Hx     Social History   Socioeconomic History  . Marital status: Married    Spouse name: Not on file  . Number of  children: 3  . Years of education: Not on file  . Highest education level: Not on file  Occupational History  . Occupation: retired-bookkeeping  Tobacco Use  . Smoking status: Former Smoker    Packs/day: 2.00    Years: 40.00    Pack years: 80.00    Quit date: 12/19/1997    Years since quitting: 22.3  . Smokeless tobacco: Never Used  Vaping Use  . Vaping Use: Never used  Substance and Sexual Activity  . Alcohol use: Yes    Alcohol/week: 1.0 standard drink    Types: 1 Glasses of wine per week    Comment: occasiona -  maybe once a month  . Drug use: No  . Sexual activity: Not on file    Comment: menarch age 49, P3, HRT X 10 YRS, MENOPAUSE MID 40'S  Other Topics Concern  . Not on file  Social History Narrative   [name] age 65- [place] age 76  [employer]   Social Determinants of Health   Financial Resource Strain: Low Risk   . Difficulty of Paying Living Expenses: Not hard at all  Food Insecurity: No Food Insecurity  . Worried About Charity fundraiser in the Last Year: Never true  . Ran Out of Food in the Last Year: Never true  Transportation Needs: No Transportation Needs  . Lack of Transportation (Medical): No  . Lack of Transportation (Non-Medical): No  Physical Activity: Inactive  . Days of Exercise per Week: 0 days  . Minutes of Exercise per Session: 0 min  Stress: No Stress Concern Present  . Feeling of Stress : Not at all  Social Connections: Socially Integrated  . Frequency of Communication with Friends and Family: More than three times a week  . Frequency of Social Gatherings with Friends and Family: More than three times a week  . Attends Religious Services: More than 4 times per year  . Active Member of Clubs or Organizations: Yes  . Attends Archivist Meetings: More than 4 times per year  . Marital Status: Married  Human resources officer Violence: Not At Risk  . Fear of Current or Ex-Partner: No  . Emotionally Abused: No  . Physically  Abused: No  . Sexually Abused: No    Current Outpatient Medications  Medication Sig Dispense Refill  . Accu-Chek FastClix Lancets MISC USE TO CHECK BLOOD SUGARS 3 TIMES DAILY 306 each 3  . acetaminophen (TYLENOL) 500 MG tablet Take 1,000 mg by mouth every 6 (six) hours as needed for moderate pain or headache.    Marland Kitchen aspirin EC 81 MG tablet Take 81 mg by mouth at bedtime.    . calcium carbonate (TUMS - DOSED IN MG ELEMENTAL CALCIUM) 500 MG chewable tablet Chew 2 tablets by mouth 3 (three) times daily as needed for indigestion or heartburn.    . Cholecalciferol (VITAMIN D3) 25 MCG (1000 UT) CAPS Take 1,000 Units by mouth daily before lunch.    . clobetasol cream (TEMOVATE) 2.58 % Apply 1 application topically 2 (two) times daily. 30 g 0  . furosemide (LASIX) 20 MG tablet Take 1 tablet (20 mg total) by mouth every Monday, Wednesday, and Friday. 90 tablet 3  . glipiZIDE (GLUCOTROL XL) 10 MG 24 hr tablet Take 1 tablet (10 mg total) by mouth daily at 12 noon. 90 tablet 1  . glucose blood (ACCU-CHEK GUIDE) test strip TEST ONCE DAILY 100 each 5  . Golimumab (SIMPONI ARIA IV) Inject 1 Dose into the vein every 8 (eight) weeks.    Marland Kitchen levothyroxine (SYNTHROID) 88 MCG tablet Take 1 tablet (88 mcg total) by mouth daily. 90 tablet 3  . losartan (COZAAR) 50 MG tablet TAKE 1 TABLET(50 MG) BY MOUTH DAILY 90 tablet 0  . Magnesium 250 MG TABS Take 250 mg by mouth at bedtime.    . melatonin 3 MG TABS tablet Take 3 mg by mouth at bedtime.    . metFORMIN (GLUCOPHAGE) 500 MG tablet Take 1 tablet (500 mg total) by mouth daily with breakfast. 90 tablet 3  . nitrofurantoin, macrocrystal-monohydrate, (MACROBID) 100 MG capsule Take 1 capsule (100 mg total) by mouth 2 (  two) times daily. 8 capsule 0  . nitroGLYCERIN (NITROSTAT) 0.4 MG SL tablet Place 1 tablet (0.4 mg total) under the tongue every 5 (five) minutes as needed for chest pain. 25 tablet 3  . Omega-3 Fatty Acids (FISH OIL) 1200 MG CAPS Take 1,200 mg by mouth in the  morning and at bedtime.    . pantoprazole (PROTONIX) 40 MG tablet Take 1 tablet (40 mg total) by mouth daily. 90 tablet 3  . PARoxetine (PAXIL) 20 MG tablet Take 1 tablet (20 mg total) by mouth in the morning. 90 tablet 3  . pioglitazone (ACTOS) 30 MG tablet Take 1 tablet (30 mg total) by mouth daily. (Patient taking differently: Take 30 mg by mouth at bedtime.) 90 tablet 1  . sennosides-docusate sodium (SENOKOT-S) 8.6-50 MG tablet Take 1 tablet by mouth daily. 90 tablet 0  . sitaGLIPtin (JANUVIA) 100 MG tablet Take 1 tablet (100 mg total) by mouth daily. (Patient taking differently: Take 100 mg by mouth at bedtime.) 90 tablet 3  . valACYclovir (VALTREX) 1000 MG tablet TAKE 1 TABLET BY MOUTH 2  TIMES DAILY AS NEEDED FOR  VIRAL INFECTION. (Patient taking differently: Take 1,000 mg by mouth daily as needed (Fever Blisters).) 180 tablet 0  . vitamin B-12 (CYANOCOBALAMIN) 500 MCG tablet Take 1,000 mcg by mouth daily with lunch.     No current facility-administered medications for this visit.    Allergies  Allergen Reactions  . Codeine Nausea And Vomiting and Other (See Comments)    Severe stomach cramps  . Antihistamines, Diphenhydramine-Type Other (See Comments)    Causes hyperactivity  . Gabapentin     Urinary incontinence  . Statins Other (See Comments)    Joint pains  . Sulfa Antibiotics     Unknown to pt  . Erythromycin Hives and Rash    Due to dental work about 50 years ago. Was used in packing and resulted in rash/hives inside and outside of mouth.     Review of Systems:              General:                      normal appetite, decreased energy, no weight gain, no weight loss, no fever             Cardiac:                       no chest pain with exertion, no chest pain at rest, +SOB with exertion, no resting SOB, no PND, no orthopnea, no palpitations, no arrhythmia, no atrial fibrillation, no LE edema, no dizzy spells, no syncope             Respiratory:                 +  exertional shortness of breath, no home oxygen, no productive cough, no dry cough, no bronchitis, no wheezing, no hemoptysis, no asthma, no pain with inspiration or cough, no sleep apnea, no CPAP at night             GI:                               no difficulty swallowing, no reflux, no frequent heartburn, no hiatal hernia, no abdominal pain, + chronic constipation, no diarrhea, no hematochezia, no hematemesis, no melena  GU:                              no dysuria,  no frequency, no urinary tract infection, no hematuria, no kidney stones, + chronic kidney disease             Vascular:                     + pain suggestive of claudication, no pain in feet, no leg cramps, no varicose veins, no DVT, no non-healing foot ulcer             Neuro:                         no stroke, no TIA's, no seizures, no headaches, no temporary blindness one eye,  no slurred speech, no peripheral neuropathy, no chronic pain, no instability of gait, mild memory/cognitive dysfunction             Musculoskeletal:         + arthritis, + joint swelling, no myalgias, no difficulty walking other than claudication, somewhat decreased mobility              Skin:                            no rash, no itching, no skin infections, no pressure sores or ulcerations             Psych:                         + anxiety, + depression, no nervousness, no unusual recent stress             Eyes:                           no blurry vision, no floaters, no recent vision changes, + wears glasses for reading             ENT:                            + hearing loss, no loose or painful teeth, no dentures, last saw dentist within the past 6 months             Hematologic:               no easy bruising, no abnormal bleeding, no clotting disorder, no frequent epistaxis             Endocrine:                   + diabetes, does occasionally check CBG's at home                   Physical Exam:   BP (!) 149/72 (BP Location: Left  Arm, Patient Position: Sitting)   Pulse 96   Resp 20   Ht '5\' 3"'  (1.6 m)   SpO2 94% Comment: RA  BMI 26.24 kg/m      General:                      Elderly,  well-appearing             HEENT:  Unremarkable              Neck:                           no JVD, no bruits, no adenopathy              Chest:                          clear to auscultation, symmetrical breath sounds, no wheezes, no rhonchi              CV:                              RRR, grade IV/VI crescendo/decrescendo murmur heard best at RUSB,  no diastolic murmur             Abdomen:                    soft, non-tender, no masses              Extremities:                 warm, well-perfused, pulses diminished, no LE edema             Rectal/GU                   Deferred             Neuro:                         Grossly non-focal and symmetrical throughout             Skin:                            Clean and dry, no rashes, no breakdown    Diagnostic Tests:  EKG:   NSR w/out significant AV conduction delay (03/11/2020)     ECHOCARDIOGRAM REPORT    Patient Name:  [name] Date of Exam: 02/29/2020  Medical Rec #: [id number]    Height:    63.0 in  Accession #:  [id number]   Weight:    142.6 lb  Date of Birth: [birth year]   BSA:     1.675 m  Patient Age:  9 years    BP:      188/89 mmHg  Patient Gender: F        HR:      97 bpm.  Exam Location: [address]   Procedure: 2D Echo, Cardiac Doppler and Color Doppler   Indications:  R06.00 SOB    History:    Patient has prior history of Echocardiogram examinations,  most         recent 01/01/2019. PAD and Breast Cancer, CKD; Risk         Factors:Diabetes, Hypertension and HLD.    Sonographer:  Marygrace Drought RCS  Referring Phys: [place]    1. The AoV is not well visualized but appears severely calcified with  restricted leaflet  motion. Vmax 3.5 m/s, MG 33 mmHG, AVA 0.66 cm2, DI  0.26. The LV cavity is small and SVi = 34 cc/m2. Finding could represent  low flow low gradient severe AS, and  would recommend either TEE or  aortic valve calcium score for clarification  if clinically indicated. The aortic valve is calcified. There is severe  calcifcation of the aortic valve. There is severe thickening of the aortic  valve. Aortic valve  regurgitation is not visualized. Moderate to severe aortic valve stenosis.  2. MR is now moderate and likely worsened in the setting of AS  progression. The mitral valve is grossly normal. Moderate mitral valve  regurgitation. No evidence of mitral stenosis.  3. Left ventricular ejection fraction, by estimation, is 65 to 70%. The  left ventricle has normal function. The left ventricle has no regional  wall motion abnormalities. There is moderate concentric left ventricular  hypertrophy. Left ventricular  diastolic function could not be evaluated.  4. Right ventricular systolic function is normal. The right ventricular  size is normal. Tricuspid regurgitation signal is inadequate for assessing  PA pressure.  5. The inferior vena cava is normal in size with greater than 50%  respiratory variability, suggesting right atrial pressure of 3 mmHg.   Comparison(s): Changes from prior study are noted. Moderate to severe AS  is now present. Moderate MR is now present. LV function remains similar.   FINDINGS  Left Ventricle: Left ventricular ejection fraction, by estimation, is 65  to 70%. The left ventricle has normal function. The left ventricle has no  regional wall motion abnormalities. The left ventricular internal cavity  size was normal in size. There is  moderate concentric left ventricular hypertrophy. Left ventricular  diastolic function could not be evaluated due to mitral regurgitation  (moderate or greater). Left ventricular diastolic function could not be  evaluated.    Right Ventricle: The right ventricular size is normal. No increase in  right ventricular wall thickness. Right ventricular systolic function is  normal. Tricuspid regurgitation signal is inadequate for assessing PA  pressure.   Left Atrium: Left atrial size was normal in size.   Right Atrium: Right atrial size was normal in size.   Pericardium: Trivial pericardial effusion is present. Presence of  pericardial fat pad.   Mitral Valve: MR is now moderate and likely worsened in the setting of AS  progression. The mitral valve is grossly normal. Mild mitral annular  calcification. Moderate mitral valve regurgitation. No evidence of mitral  valve stenosis.   Tricuspid Valve: The tricuspid valve is grossly normal. Tricuspid valve  regurgitation is trivial. No evidence of tricuspid stenosis.   Aortic Valve: The AoV is not well visualized but appears severely  calcified with restricted leaflet motion. Vmax 3.5 m/s, MG 33 mmHG, AVA  0.66 cm2, DI 0.26. The LV cavity is small and SVi = 34 cc/m2. Finding  could represent low flow low gradient severe  AS, and would recommend either TEE or aortic valve calcium score for  clarification if clinically indicated. The aortic valve is calcified.  There is severe calcifcation of the aortic valve. There is severe  thickening of the aortic valve. Aortic valve  regurgitation is not visualized. Moderate to severe aortic stenosis is  present. Aortic valve mean gradient measures 33.0 mmHg. Aortic valve peak  gradient measures 48.7 mmHg. Aortic valve area, by VTI measures 0.66 cm.   Pulmonic Valve: The pulmonic valve was grossly normal. Pulmonic valve  regurgitation is not visualized. No evidence of pulmonic stenosis.   Aorta: The aortic root and ascending aorta are structurally normal, with  no evidence of dilitation.   Venous: The inferior vena cava is normal in size with greater than 50%  respiratory variability, suggesting right atrial  pressure  of 3 mmHg.   IAS/Shunts: The atrial septum is grossly normal.     LEFT VENTRICLE  PLAX 2D  LVIDd:     4.10 cm Diastology  LVIDs:     2.60 cm LV e' medial:  5.55 cm/s  LV PW:     1.40 cm LV E/e' medial: 27.4  LV IVS:    1.38 cm LV e' lateral:  4.24 cm/s  LVOT diam:   1.81 cm LV E/e' lateral: 35.8  LV SV:     57  LV SV Index:  34  LVOT Area:   2.57 cm     RIGHT VENTRICLE  RV Basal diam: 2.30 cm  RV S prime:   9.57 cm/s  TAPSE (M-mode): 2.3 cm   LEFT ATRIUM       Index    RIGHT ATRIUM     Index  LA diam:    3.30 cm 1.97 cm/m RA Area:   8.07 cm  LA Vol (A2C):  35.9 ml 21.44 ml/m RA Volume:  14.80 ml 8.84 ml/m  LA Vol (A4C):  36.7 ml 21.91 ml/m  LA Biplane Vol: 36.1 ml 21.56 ml/m  AORTIC VALVE  AV Area (Vmax):  0.61 cm  AV Area (Vmean):  0.54 cm  AV Area (VTI):   0.66 cm  AV Vmax:      349.00 cm/s  AV Vmean:     265.000 cm/s  AV VTI:      0.864 m  AV Peak Grad:   48.7 mmHg  AV Mean Grad:   33.0 mmHg  LVOT Vmax:     82.50 cm/s  LVOT Vmean:    55.700 cm/s  LVOT VTI:     0.223 m  LVOT/AV VTI ratio: 0.26    AORTA  Ao Root diam: 3.20 cm  Ao Asc diam: 2.90 cm   MITRAL VALVE  MV Area (PHT):        SHUNTS  MV Decel Time:        Systemic VTI: 0.22 m  MR Peak grad:  172.9 mmHg Systemic Diam: 1.81 cm  MR Mean grad:  108.5 mmHg  MR Vmax:     657.50 cm/s  MR Vmean:    477.5 cm/s  MR PISA:     2.65 cm  MR PISA Eff ROA: 12 mm  MR PISA Radius: 0.65 cm  MV E velocity: 152.00 cm/s  MV A velocity: 168.00 cm/s  MV E/A ratio: 0.90   Eleonore Chiquito MD  Electronically signed by Eleonore Chiquito MD  Signature Date/Time: 02/29/2020/10:45:35 AM       RIGHT/LEFT HEART CATH AND CORONARY ANGIOGRAPHY    Conclusion    Ost RCA to Prox RCA lesion is 80% stenosed.  Mid RCA lesion is 30% stenosed.  RPDA lesion is 60%  stenosed.  1st Mrg lesion is 50% stenosed.  Prox Cx to Mid Cx lesion is 80% stenosed.  Mid LAD lesion is 80% stenosed.  1. No significant disease in the left main artery 2. Severe mid LAD stenosis. The LAD bifurcated distally beyond this lesion and the diagonal branch reaches the apex.  3. Moderate caliber non-dominant Circumflex with severe mid stenosis. The vessel is relatively small beyond the stenosis. The small to moderate caliber obtuse marginal branch arises prior to the stenosis and has moderate ostial stenosis.  4. The RCA is a large dominant artery. There is severe, heavily calcified stenosis at the ostium of the RCA. The distal stented segment is patent. There is moderately  severe stenosis in the moderate caliber PDA.  5. Severe aortic stenosis (mean gradient 26.9 mmHg, peak gradient 34 mmHg)  Recommendations: She likely has paradoxical low flow/low gradient aortic. I had hoped we could proceed with TAVR, however, given the severe disease in her coronary arteries, we will have to explore surgical AVR/CABG as an option. The ostial RCA stenosis is heavily calcified and would require orbital atherectomy and would high risk for complications. The LAD stenosis could be easily treated with PCI/stenting. She is a very functional 80 yo female and I think she may be a candidate for CABG with AVR. We will let her go home today as there are no critical coronary lesions. We will plan her pre TAVR CT scans and will have her seen by one of our CT surgeons on the valve team.    Indications  Severe aortic stenosis [I35.0 (ICD-10-CM)]   Procedural Details  Technical Details Indication: 80 yo female with history of CAD, severe aortic stenosis with recent progressive dyspnea and fatigue. She was seen for TAVR workup.   Procedure: The risks, benefits, complications, treatment options, and expected outcomes were discussed with the patient. The patient and/or family concurred with the  proposed plan, giving informed consent. The patient was brought to the cath lab after IV hydration was given. The patient was sedated with Versed and Fentanyl. The IV catheter in the right antecubital vein was changed for a 5 [place] sheath. Right heart catheterization performed with a balloon tipped catheter. The right wrist was prepped and draped in a sterile fashion. 1% lidocaine was used for local anesthesia. Using the modified Seldinger access technique, a 5 French sheath was placed in the right radial artery. 3 mg Verapamil was given through the sheath. 3500 units IV heparin was given. Standard diagnostic catheters were used to perform selective coronary angiography. I crossed the aortic valve with an AL-1 catheter and straight wire. LV pressures measured. The sheath was removed from the right radial artery and a Terumo hemostasis band was applied at the arteriotomy site on the right wrist.    Estimated blood loss <50 mL.   During this procedure medications were administered to achieve and maintain moderate conscious sedation while the patient's heart rate, blood pressure, and oxygen saturation were continuously monitored and I was present face-to-face 100% of this time.   Medications (Filter: Administrations occurring from 1153 to 1256 on 03/17/20) (important) Continuous medications are totaled by the amount administered until 03/17/20 1256.    midazolam (VERSED) injection (mg) Total dose:  1 mg  Date/Time Rate/Dose/Volume Action   03/17/20 1212 1 mg Given    fentaNYL (SUBLIMAZE) injection (mcg) Total dose:  25 mcg  Date/Time Rate/Dose/Volume Action   03/17/20 1212 25 mcg Given    lidocaine (PF) (XYLOCAINE) 1 % injection (mL) Total volume:  4 mL  Date/Time Rate/Dose/Volume Action   03/17/20 1226 2 mL Given   1226 2 mL Given    Radial Cocktail/Verapamil only (mL) Total volume:  10 mL  Date/Time Rate/Dose/Volume Action   03/17/20 1227 10 mL Given     heparin sodium (porcine) injection (Units) Total dose:  3,500 Units  Date/Time Rate/Dose/Volume Action   03/17/20 1234 3,500 Units Given    iohexol (OMNIPAQUE) 350 MG/ML injection (mL) Total volume:  55 mL  Date/Time Rate/Dose/Volume Action   03/17/20 1250 55 mL Given    Heparin (Porcine) in NaCl 1000-0.9 UT/500ML-% SOLN (mL) Total volume:  1,000 mL  Date/Time Rate/Dose/Volume Action   03/17/20 1254 500 mL  Given   1254 500 mL Given    Sedation Time  Sedation Time Physician-1: 35 minutes 28 seconds   Contrast  Medication Name Total Dose  iohexol (OMNIPAQUE) 350 MG/ML injection 55 mL    Radiation/Fluoro  Fluoro time: 6 (min) DAP: 9.5 (Gycm2) Cumulative Air Kerma: 979.4 (mGy)   Complications   Complications documented before study signed (03/17/2020 1:09 PM)     RIGHT/LEFT HEART CATH AND CORONARY ANGIOGRAPHY  None Documented by Burnell Blanks, MD 03/17/2020 1:02 PM  Date Found: 03/17/2020  Time Range: Intraprocedure       Coronary Findings   Diagnostic Dominance: Right  Left Anterior Descending  Vessel is large.  Mid LAD lesion is 80% stenosed.  Left Circumflex  Vessel is moderate in size.  Prox Cx to Mid Cx lesion is 80% stenosed.  First Obtuse Marginal Branch  1st Mrg lesion is 50% stenosed.  Right Coronary Artery  Vessel is large.  Ost RCA to Prox RCA lesion is 80% stenosed. The lesion is calcified.  Mid RCA lesion is 30% stenosed. The lesion was previously treated using a drug eluting stent over 2 years ago.  Right Posterior Descending Artery  RPDA lesion is 60% stenosed.   Intervention   No interventions have been documented.  Coronary Diagrams   Diagnostic Dominance: Right    Intervention    Implants    No implant documentation for this case.    Syngo Images  Show images for CARDIAC CATHETERIZATION  Images on Long Term Storage  Show images for  Tarah, Buboltz to Procedure Log  Procedure Log     Hemo Data  Flowsheet Row Most Recent Value  Fick Cardiac Output 7.8 L/min  Fick Cardiac Output Index 4.68 (L/min)/BSA  Aortic Mean Gradient 26.85 mmHg  Aortic Peak Gradient 34 mmHg  Aortic Valve Area 1.27  Aortic Value Area Index 0.76 cm2/BSA  RA A Wave 8 mmHg  RA V Wave 3 mmHg  RA Mean 4 mmHg  RV Systolic Pressure 47 mmHg  RV Diastolic Pressure 3 mmHg  RV EDP 6 mmHg  PA Systolic Pressure 40 mmHg  PA Diastolic Pressure 16 mmHg  PA Mean 32 mmHg  PW A Wave 25 mmHg  PW V Wave 30 mmHg  PW Mean 22 mmHg  AO Systolic Pressure 801 mmHg  AO Diastolic Pressure 73 mmHg  AO Mean 655 mmHg  LV Systolic Pressure 374 mmHg  LV Diastolic Pressure 13 mmHg  LV EDP 20 mmHg  AOp Systolic Pressure 827 mmHg  AOp Diastolic Pressure 72 mmHg  AOp Mean Pressure 078 mmHg  LVp Systolic Pressure 675 mmHg  LVp Diastolic Pressure 21 mmHg  LVp EDP Pressure 26 mmHg  QP/QS 1  TPVR Index 6.84 HRUI  TSVR Index 24.59 HRUI  PVR SVR Ratio 0.09  TPVR/TSVR Ratio 0.28     CXR: Formal report of imaging from interpreting radiology remains pending at this time.  To my exam the lungs appear clear with continued resolution of the opacity in the right middle lobe.   Impression:  Patient has severe multivessel coronary artery disease and stage D3 severe low-flow, low gradient aortic stenosis.  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  She has not had any symptoms at rest.  I have personally reviewed the patient's recent EKG, transthoracic echocardiogram, diagnostic cardiac  catheterization and chest x-rays.  EKG reveals sinus rhythm with no significant AV conduction delay.  Echocardiogram reveals severe aortic stenosis with moderate mitral  regurgitation.  Left ventricular systolic function remains normal with ejection fraction estimated 65 to 70%.  There were no significant wall motion abnormalities.  The aortic valve is trileaflet with moderate to severely restricted leaflet mobility and calcification involving all 3 leaflets of the aortic valve.  Peak velocity across aortic valve measured 3.5 to 3.6 m/s corresponding to mean transvalvular gradient estimated 33 mmHg but aortic valve area calculated only 0.66 cm with DVI reported 0.26 and small left ventricular cavity volume with stroke-volume index 34 mL.  Diagnostic cardiac catheterization confirmed the presence of aortic stenosis and revealed mild to moderate pulmonary hypertension with severe multivessel coronary artery disease.  There was a long segment diffuse stenosis of the mid left anterior descending coronary artery as well as high-grade ostial stenosis of the right coronary artery.    Follow-up chest x-ray reveals resolution of the patient's previous right middle lobe pneumonia.  I agree the patient would probably best be treated with coronary artery bypass grafting and conventional surgical aortic valve replacement.  PCI and stenting with transcatheter aortic valve replacement could be considered as an option, but the ostial disease of the right coronary artery is heavily calcified and would not likely be easily treated with PCI and stenting.  Risks associated with surgical aortic valve replacement and coronary artery bypass grafting will be somewhat elevated because of the patient's age and comorbid medical problems.  Nonetheless, risks do not appear to be prohibitive, and long-term outcome would clearly be better with surgical intervention.    Plan:  The patient was again counseled at length regarding treatment alternatives for management of severe symptomatic aortic stenosis and multivessel coronary artery disease. Alternative approaches such as conventional aortic valve  replacement with coronary artery bypass grafting, transcatheter aortic valve replacement with multivessel PCI and stenting, and continued medical therapy without intervention were compared and contrasted at length.  The risks associated with conventional surgery were discussed in detail, as were expectations for post-operative convalescence, prosthetic valve choices, and the advantages of treating both the patient's aortic stenosis and coronary artery disease using definitive surgical intervention at the same time.  Concerns related to the suitability of the patient's coronary artery disease to PCI and stenting was discussed.  Issues specific to transcatheter aortic valve replacement were discussed including questions about long term valve durability, the potential for paravalvular leak, possible increased risk of need for permanent pacemaker placement, the suitability of the patient's surgical anatomy, and other potential technical complications related to the procedure itself.  Long-term prognosis without surgical intervention was discussed.  All treatment options were discussed in the context of the patient's own specific clinical presentation, past medical history, long-term prognosis and life expectancy.    The patient understands and accepts all potential associated risks of surgery including but not limited to risk of death, stroke, myocardial infarction, congestive heart failure, respiratory failure, renal failure, pneumonia, bleeding requiring blood transfusion and or reexploration, arrhythmia, heart block or bradycardia requiring permanent pacemaker, aortic dissection or other major vascular complication, pleural effusions or other delayed complications related to continued congestive heart failure, other late complications related to valve replacement including structural valve deterioration and failure, thrombosis, endocarditis, or paravalvular leak, as well as a late recurrence of symptomatic  ischemic heart disease.    Expectations for her postoperative convalescence have been discussed.  All of her questions have been  answered.    I spent in excess of 15 minutes during the conduct of this office consultation and >50% of this time involved direct face-to-face encounter with the patient for counseling and/or coordination of their care.    Valentina Gu. Roxy Manns, MD 05/02/2020 1:20 PM

## 2020-05-02 NOTE — Progress Notes (Signed)
Pre-CABG study completed.  ° °Please see CV Proc for preliminary results.  ° °Braian Tijerina, RDMS, RVT ° °

## 2020-05-02 NOTE — Pre-Procedure Instructions (Signed)
[name]  05/02/2020    Your procedure is scheduled on Wednesday, May 04, 2020 at 8:30 AM.   Report to [facility] Entrance "A" Admitting Office at 6:30 AM.   Call this number if you have problems the morning of surgery: [phone]   Questions prior to day of surgery, please call [phone] between 8 & 4 PM.   Remember:  Do not eat or drink after midnight Tuesday, 05/03/20.      Take these medicines the morning of surgery with A SIP OF WATER: None     WHAT DO I DO ABOUT MY DIABETES MEDICATION?   [name] Kitchen Do not take oral diabetes medicines (pills) the morning of surgery.  HOW TO MANAGE YOUR DIABETES BEFORE AND AFTER SURGERY  Why is it important to control my blood sugar before and after surgery? . Improving blood sugar levels before and after surgery helps healing and can limit problems. . A way of improving blood sugar control is eating a healthy diet by: o  Eating less sugar and carbohydrates o  Increasing activity/exercise o  Talking with your doctor about reaching your blood sugar goals . High blood sugars (greater than 180 mg/dL) can raise your risk of infections and slow your recovery, so you will need to focus on controlling your diabetes during the weeks before surgery. . Make sure that the doctor who takes care of your diabetes knows about your planned surgery including the date and location.  How do I manage my blood sugar before surgery? . Check your blood sugar at least 4 times a day, starting 2 days before surgery, to make sure that the level is not too high or low. . Check your blood sugar the morning of your surgery when you wake up and every 2 hours until you get to the Short Stay unit. o If your blood sugar is less than 70 mg/dL, you will need to treat for low blood sugar: - Do not take insulin. - Treat a low blood sugar (less than 70 mg/dL) with  cup of clear juice (cranberry or apple), 4 glucose tablets, OR glucose gel. - Recheck blood sugar  in 15 minutes after treatment (to make sure it is greater than 70 mg/dL). If your blood sugar is not greater than 70 mg/dL on recheck, call [phone] for further instructions. . Report your blood sugar to the short stay nurse when you get to Short Stay.  . If you are admitted to the hospital after surgery: o Your blood sugar will be checked by the staff and you will probably be given insulin after surgery (instead of oral diabetes medicines) to make sure you have good blood sugar levels. o The goal for blood sugar control after surgery is 80-180 mg/dL.     Do not wear jewelry, make-up or nail polish.  Do not wear lotions, powders, perfumes or deodorant.  Do not shave 48 hours prior to surgery.   Do not bring valuables to the hospital.  [facility] is not responsible for any belongings or valuables.  Contacts, dentures or bridgework may not be worn into surgery.  Leave your suitcase in the car.  After surgery it may be brought to your room.  For patients admitted to the hospital, discharge time will be determined by your treatment team.  [facility] - Preparing for Surgery  Before surgery, you can play an important role.  Because skin is not sterile, your skin needs to be as free of germs as  possible.  You can reduce the number of germs on you skin by washing with CHG (chlorahexidine gluconate) soap before surgery.  CHG is an antiseptic cleaner which kills germs and bonds with the skin to continue killing germs even after washing.  Oral Hygiene is also important in reducing the risk of infection.  Remember to brush your teeth with your regular toothpaste the morning of surgery.  Please DO NOT use if you have an allergy to CHG or antibacterial soaps.  If your skin becomes reddened/irritated stop using the CHG and inform your nurse when you arrive at Short Stay.  Do not shave (including legs and underarms) for at least 48 hours prior to the first CHG shower.  You may shave your  face.  Please follow these instructions carefully:   1.  Shower with CHG Soap the night before surgery and the morning of Surgery.  2.  If you choose to wash your hair, wash your hair first as usual with your normal shampoo.  3.  After you shampoo, rinse your hair and body thoroughly to remove the shampoo. 4.  Use CHG as you would any other liquid soap.  You can apply chg directly to the skin and wash gently with a      scrungie or washcloth.           5.  Apply the CHG Soap to your body ONLY FROM THE NECK DOWN.   Do not use on open wounds or open sores. Avoid contact with your eyes, ears, mouth and genitals (private parts).  Wash genitals (private parts) with your normal soap.  6.  Wash thoroughly, paying special attention to the area where your surgery will be performed.  7.  Thoroughly rinse your body with warm water from the neck down.  8.  DO NOT shower/wash with your normal soap after using and rinsing off the CHG Soap.  9.  Pat yourself dry with a clean towel.            10.  Wear clean pajamas.            11.  Place clean sheets on your bed the night of your first shower and do not sleep with pets.  Day of Surgery  Shower as above. Do not apply any lotions/deodorants the morning of surgery.   Please wear clean clothes to the hospital. Remember to brush your teeth with toothpaste.  Please read over the fact sheets that you were given.

## 2020-05-02 NOTE — H&P (View-Only) (Signed)
[address]       [place] [address]             [phone]     CARDIOTHORACIC SURGERY CONSULTATION REPORT  Referring Provider is Josue Hector, MD PCP is Ronnald Nian, DO  Chief Complaint  Patient presents with  . Aortic Stenosis    AVR/CABG scheduled 4/13  . Coronary Artery Disease    HPI:  Patient is a 80 year old female with history of aortic stenosis, coronary artery disease, hypertension, hyperlipidemia, type 2 diabetes mellitus with complications, peripheral arterial disease with chronic claudication, remote history of tobacco abuse, stage IV chronic kidney disease, anxiety and depression who returns to the office for management of aortic stenosis and multivessel coronary artery disease.  Patient's cardiac history dates back to 2007 when she underwent PCI and stenting of the right coronary artery.  She reportedly did not have a heart attack at that time.  For the last several years she has been followed intermittently by Dr. Johnsie Cancel.  Nuclear stress test in 2017 revealed no ischemia.  Echocardiograms have documented the presence of normal left ventricular systolic function with aortic stenosis and mitral regurgitation that has gradually progressed in severity.  Over the last 2 to 3 months the patient has developed symptoms of exertional shortness of breath with vague choking sensation in her throat.  Symptoms have always been associated with physical exertion and are promptly relieved by rest, although they may occur with relatively low level activity.  She has not had any resting shortness of breath or chest discomfort.  She denies any history of PND, orthopnea, or lower extremity edema.  She has never had any dizzy spells or syncope.  She was seen urgently in the office by Dr. Johney Frame and transthoracic echocardiogram performed February 29, 2020 revealed progression in the severity of the patient's aortic valve disease with at least moderate and  probably stage D3 severe symptomatic aortic stenosis.  Left ventricular systolic function remain normal with ejection fraction estimated 65 to 70%.  There was moderate mitral regurgitation which appeared to be secondary (functional) and somewhat worse than previous echocardiograms.  The patient was referred to the multidisciplinary heart valve clinic and has been evaluated previously by Dr. Angelena Form.  Diagnostic cardiac catheterization was performed March 17, 2020.  Catheterization confirmed the presence of aortic stenosis with peak to peak and mean transvalvular gradients measured 34 and 26.9 mmHg, respectively.  There was severe multivessel coronary artery disease with long segment severe stenosis of the mid left anterior descending coronary artery and high-grade ostial stenosis of the right coronary artery with continued patency in the distal segment of the right coronary artery.  Cardiac output remain normal.  There was mild to moderate pulmonary hypertension.  Patient was referred for surgical consultation.  Patient is married and lives locally in [place].  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  She has remained reasonably active and functionally independent throughout retirement.  She enjoys working in her garden and Medical laboratory scientific officer vegetables.  She does not exercise on a regular basis.  She does not report several year history of chronic bilateral calf claudication.  Symptoms occur in both lower legs, typically with ambulating somewhere near 100 feet distance.  Symptoms are always relieved by rest.  Over the past 2 to 3 months the patient has developed progressive exertional shortness of breath with vague choking sensation in the neck.  She has  not had any chest pain or chest tightness.  She has not had any resting shortness of breath or resting symptoms of discomfort in her neck.  She denies any PND, orthopnea, or lower extremity edema.  She has not had  palpitations, dizzy spells, nor syncope.  Patient was originally seen in consultation on March 30, 2020 at which time we made plans for elective aortic valve replacement and coronary artery bypass grafting.  Surgery was scheduled for April 07, 2020 but routine preoperative chest radiograph revealed evidence of community-acquired pneumonia particularly involving the right middle lobe.  Covid testing was negative but the patient's white blood count was slightly elevated.  She was treated with a 7-week course of oral Levaquin and was last seen here in follow-up on April 18, 2020 at which time she was doing very well clinically and her chest x-ray appeared much improved.  She returns the office today for follow-up with tentative plans to proceed with elective aortic valve replacement and coronary artery bypass grafting on May 04, 2020.  She states that she is now completely back to normal.  She states that she only gets short of breath with more strenuous physical exertion.  Appetite is normal.  She has not had any fevers or chills.  She does not have any cough.  Past Medical History:  Diagnosis Date  . Allergy   . Anemia    childhood  . Anxiety   . Aortic stenosis   . Arterial stenosis (HCC)    mesenteric  . Arthritis   . Breast cancer ([facility]) 12/05/10   R breast, inv mammary, in situ,ER/PR +,HER2 -  . Cancer ([facility])   . Carcinoma of breast treated with adjuvant chemotherapy ([facility])   . 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[facility])   . Claudication ([facility])   . Coronary artery disease    stent 2007  . Depression   . Diabetes mellitus   . Difficulty sleeping   . Diverticulosis 12/10/03  . Elevated cholesterol   . Generalized weakness   . GERD (gastroesophageal reflux disease)   . Heart murmur   . Hepatitis    as an infant  . HSV-1 (herpes simplex virus 1) infection   . Hyperlipidemia   . Hypertension   . Hypothyroidism   . Osteoarthritis   . PAD (peripheral artery disease) ([facility])   .  Personal history of chemotherapy   . Personal history of radiation therapy   . Pneumonia    2014 september and March 2022  . Rheumatoid arthritis ([facility])   . 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 Spinal stenosis   . Thyroid disease     Past Surgical History:  Procedure Laterality Date  . ABDOMINAL AORTOGRAM W/LOWER EXTREMITY Bilateral 04/29/2019   Procedure: ABDOMINAL AORTOGRAM W/LOWER EXTREMITY;  Surgeon: Marty Heck, MD;  Location: [facility];  Service: Cardiovascular;  Laterality: Bilateral;  . ABDOMINAL AORTOGRAM W/LOWER EXTREMITY Left 07/01/2019   Procedure: ABDOMINAL AORTOGRAM W/ Left LOWER EXTREMITY [place];  Surgeon: Marty Heck, MD;  Location: [facility];  Service: Cardiovascular;  Laterality: Left;  . ANTERIOR AND POSTERIOR REPAIR N/A 04/05/2016   Procedure: ANTERIOR (CYSTOCELE) AND POSTERIOR REPAIR (RECTOCELE);  Surgeon: [name], MD;  Location: [facility];  Service: Gynecology;  Laterality: N/A;  . BREAST BIOPSY    . BREAST LUMPECTOMY  1983   benign biopsy  . BREAST LUMPECTOMY Right 12/29/2010   snbx, ER/PR +, Her2 -,  0/1 node pos.  . CARPAL TUNNEL RELEASE Bilateral 9/12,8,12  . CHOLECYSTECTOMY    . COLONOSCOPY N/A 02/09/2013   Procedure: COLONOSCOPY;  Surgeon: [name], MD;  Location: [facility];  Service: Endoscopy;  Laterality: N/A;  . COLONOSCOPY    . CORONARY ANGIOPLASTY    . DILATION AND CURETTAGE OF UTERUS    . ESOPHAGOGASTRODUODENOSCOPY N/A 02/09/2013   Procedure: ESOPHAGOGASTRODUODENOSCOPY (EGD);  Surgeon: [name], MD;  Location: [name];  Service: Endoscopy;  Laterality: N/A;  . FOOT SPURS    . HYSTEROSCOPY WITH D & C N/A 09/11/2012   Procedure: DILATATION AND CURETTAGE ;  Surgeon: [name], MD;  Location: [facility];  Service: Gynecology;  Laterality: N/A;  . KNEE SURGERY  2007  . LAPAROSCOPIC ASSISTED VAGINAL HYSTERECTOMY N/A 04/05/2016   Procedure: LAPAROSCOPIC ASSISTED VAGINAL HYSTERECTOMY  possible BSO;  Surgeon: [name], MD;  Location: [facility];  Service: Gynecology;  Laterality: N/A;  . LUMBAR LAMINECTOMY/DECOMPRESSION MICRODISCECTOMY N/A 10/20/2014   Procedure: MICRO LUMBER DECOMPRESSION L3-4 L4-5;  Surgeon: [name], MD;  Location: [facility];  Service: Orthopedics;  Laterality: N/A;  . LUMBAR LAMINECTOMY/DECOMPRESSION MICRODISCECTOMY Bilateral 10/13/2015   Procedure: MICRO LUMBAR DECOMPRESSION L5 - S1 AND REDO DECOMPRESSION L4 - L5 AND REMOVAL OF FACET CYST L4 - L5 2 LEVELS;  Surgeon: [name], MD;  Location: [facility];  Service: Orthopedics;  Laterality: Bilateral;  . PERIPHERAL VASCULAR INTERVENTION Right 04/29/2019   Procedure: PERIPHERAL VASCULAR INTERVENTION;  Surgeon: Marty Heck, MD;  Location: [facility];  Service: Cardiovascular;  Laterality: Right;  . PORT-A-CATH REMOVAL  04/17/2011   Procedure: REMOVAL PORT-A-CATH;  Surgeon: [name], MD;  Location: [facility];  Service: General;  Laterality: Left;  . PORTACATH PLACEMENT  02/07/2011   Procedure: INSERTION PORT-A-CATH;  Surgeon: [name], MD;  Location: [facility];  Service: General;  Laterality: N/A;  . RIGHT/LEFT HEART CATH AND CORONARY ANGIOGRAPHY N/A 03/17/2020   Procedure: RIGHT/LEFT HEART CATH AND CORONARY ANGIOGRAPHY;  Surgeon: Burnell Blanks, MD;  Location: [facility];  Service: Cardiovascular;  Laterality: N/A;  . UPPER GASTROINTESTINAL ENDOSCOPY      Family History  Problem Relation Age of Onset  . Throat cancer Father   . Alcoholism Father   . Heart attack Father   . Heart disease Mother   . Lymphoma Brother 29  . Cancer Son   . Heart attack Brother   . Diabetes Brother   . Hypertension Brother   . Colon cancer Neg Hx   . Stroke Neg Hx   . Esophageal cancer Neg Hx   . Rectal cancer Neg Hx   . Stomach cancer Neg Hx     Social History   Socioeconomic History  . Marital status: Married    Spouse name: Not on file  . Number of  children: 3  . Years of education: Not on file  . Highest education level: Not on file  Occupational History  . Occupation: retired-bookkeeping  Tobacco Use  . Smoking status: Former Smoker    Packs/day: 2.00    Years: 40.00    Pack years: 80.00    Quit date: 12/19/1997    Years since quitting: 22.3  . Smokeless tobacco: Never Used  Vaping Use  . Vaping Use: Never used  Substance and Sexual Activity  . Alcohol use: Yes    Alcohol/week: 1.0 standard drink    Types: 1 Glasses of wine per week    Comment: occasiona -  maybe once a month  . Drug use: No  . Sexual activity: Not on file    Comment: menarch age 65, P3, HRT X 10 YRS, MENOPAUSE MID 40'S  Other Topics Concern  . Not on file  Social History Narrative   [name] age 13- [place] age 20  [place]   Social Determinants of Health   Financial Resource Strain: Low Risk   . Difficulty of Paying Living Expenses: Not hard at all  Food Insecurity: No Food Insecurity  . Worried About Charity fundraiser in the Last Year: Never true  . Ran Out of Food in the Last Year: Never true  Transportation Needs: No Transportation Needs  . Lack of Transportation (Medical): No  . Lack of Transportation (Non-Medical): No  Physical Activity: Inactive  . Days of Exercise per Week: 0 days  . Minutes of Exercise per Session: 0 min  Stress: No Stress Concern Present  . Feeling of Stress : Not at all  Social Connections: Socially Integrated  . Frequency of Communication with Friends and Family: More than three times a week  . Frequency of Social Gatherings with Friends and Family: More than three times a week  . Attends Religious Services: More than 4 times per year  . Active Member of Clubs or Organizations: Yes  . Attends Archivist Meetings: More than 4 times per year  . Marital Status: Married  Human resources officer Violence: Not At Risk  . Fear of Current or Ex-Partner: No  . Emotionally Abused: No  . Physically  Abused: No  . Sexually Abused: No    Current Outpatient Medications  Medication Sig Dispense Refill  . Accu-Chek FastClix Lancets MISC USE TO CHECK BLOOD SUGARS 3 TIMES DAILY 306 each 3  . acetaminophen (TYLENOL) 500 MG tablet Take 1,000 mg by mouth every 6 (six) hours as needed for moderate pain or headache.    Marland Kitchen aspirin EC 81 MG tablet Take 81 mg by mouth at bedtime.    . calcium carbonate (TUMS - DOSED IN MG ELEMENTAL CALCIUM) 500 MG chewable tablet Chew 2 tablets by mouth 3 (three) times daily as needed for indigestion or heartburn.    . Cholecalciferol (VITAMIN D3) 25 MCG (1000 UT) CAPS Take 1,000 Units by mouth daily before lunch.    . clobetasol cream (TEMOVATE) 8.41 % Apply 1 application topically 2 (two) times daily. 30 g 0  . furosemide (LASIX) 20 MG tablet Take 1 tablet (20 mg total) by mouth every Monday, Wednesday, and Friday. 90 tablet 3  . glipiZIDE (GLUCOTROL XL) 10 MG 24 hr tablet Take 1 tablet (10 mg total) by mouth daily at 12 noon. 90 tablet 1  . glucose blood (ACCU-CHEK GUIDE) test strip TEST ONCE DAILY 100 each 5  . Golimumab (SIMPONI ARIA IV) Inject 1 Dose into the vein every 8 (eight) weeks.    Marland Kitchen levothyroxine (SYNTHROID) 88 MCG tablet Take 1 tablet (88 mcg total) by mouth daily. 90 tablet 3  . losartan (COZAAR) 50 MG tablet TAKE 1 TABLET(50 MG) BY MOUTH DAILY 90 tablet 0  . Magnesium 250 MG TABS Take 250 mg by mouth at bedtime.    . melatonin 3 MG TABS tablet Take 3 mg by mouth at bedtime.    . metFORMIN (GLUCOPHAGE) 500 MG tablet Take 1 tablet (500 mg total) by mouth daily with breakfast. 90 tablet 3  . nitrofurantoin, macrocrystal-monohydrate, (MACROBID) 100 MG capsule Take 1 capsule (100 mg total) by mouth 2 (  two) times daily. 8 capsule 0  . nitroGLYCERIN (NITROSTAT) 0.4 MG SL tablet Place 1 tablet (0.4 mg total) under the tongue every 5 (five) minutes as needed for chest pain. 25 tablet 3  . Omega-3 Fatty Acids (FISH OIL) 1200 MG CAPS Take 1,200 mg by mouth in the  morning and at bedtime.    . pantoprazole (PROTONIX) 40 MG tablet Take 1 tablet (40 mg total) by mouth daily. 90 tablet 3  . PARoxetine (PAXIL) 20 MG tablet Take 1 tablet (20 mg total) by mouth in the morning. 90 tablet 3  . pioglitazone (ACTOS) 30 MG tablet Take 1 tablet (30 mg total) by mouth daily. (Patient taking differently: Take 30 mg by mouth at bedtime.) 90 tablet 1  . sennosides-docusate sodium (SENOKOT-S) 8.6-50 MG tablet Take 1 tablet by mouth daily. 90 tablet 0  . sitaGLIPtin (JANUVIA) 100 MG tablet Take 1 tablet (100 mg total) by mouth daily. (Patient taking differently: Take 100 mg by mouth at bedtime.) 90 tablet 3  . valACYclovir (VALTREX) 1000 MG tablet TAKE 1 TABLET BY MOUTH 2  TIMES DAILY AS NEEDED FOR  VIRAL INFECTION. (Patient taking differently: Take 1,000 mg by mouth daily as needed (Fever Blisters).) 180 tablet 0  . vitamin B-12 (CYANOCOBALAMIN) 500 MCG tablet Take 1,000 mcg by mouth daily with lunch.     No current facility-administered medications for this visit.    Allergies  Allergen Reactions  . Codeine Nausea And Vomiting and Other (See Comments)    Severe stomach cramps  . Antihistamines, Diphenhydramine-Type Other (See Comments)    Causes hyperactivity  . Gabapentin     Urinary incontinence  . Statins Other (See Comments)    Joint pains  . Sulfa Antibiotics     Unknown to pt  . Erythromycin Hives and Rash    Due to dental work about 50 years ago. Was used in packing and resulted in rash/hives inside and outside of mouth.     Review of Systems:              General:                      normal appetite, decreased energy, no weight gain, no weight loss, no fever             Cardiac:                       no chest pain with exertion, no chest pain at rest, +SOB with exertion, no resting SOB, no PND, no orthopnea, no palpitations, no arrhythmia, no atrial fibrillation, no LE edema, no dizzy spells, no syncope             Respiratory:                 +  exertional shortness of breath, no home oxygen, no productive cough, no dry cough, no bronchitis, no wheezing, no hemoptysis, no asthma, no pain with inspiration or cough, no sleep apnea, no CPAP at night             GI:                               no difficulty swallowing, no reflux, no frequent heartburn, no hiatal hernia, no abdominal pain, + chronic constipation, no diarrhea, no hematochezia, no hematemesis, no melena  GU:                              no dysuria,  no frequency, no urinary tract infection, no hematuria, no kidney stones, + chronic kidney disease             Vascular:                     + pain suggestive of claudication, no pain in feet, no leg cramps, no varicose veins, no DVT, no non-healing foot ulcer             Neuro:                         no stroke, no TIA's, no seizures, no headaches, no temporary blindness one eye,  no slurred speech, no peripheral neuropathy, no chronic pain, no instability of gait, mild memory/cognitive dysfunction             Musculoskeletal:         + arthritis, + joint swelling, no myalgias, no difficulty walking other than claudication, somewhat decreased mobility              Skin:                            no rash, no itching, no skin infections, no pressure sores or ulcerations             Psych:                         + anxiety, + depression, no nervousness, no unusual recent stress             Eyes:                           no blurry vision, no floaters, no recent vision changes, + wears glasses for reading             ENT:                            + hearing loss, no loose or painful teeth, no dentures, last saw dentist within the past 6 months             Hematologic:               no easy bruising, no abnormal bleeding, no clotting disorder, no frequent epistaxis             Endocrine:                   + diabetes, does occasionally check CBG's at home                   Physical Exam:   BP (!) 149/72 (BP Location: Left  Arm, Patient Position: Sitting)   Pulse 96   Resp 20   Ht '5\' 3"'  (1.6 m)   SpO2 94% Comment: RA  BMI 26.24 kg/m      General:                      Elderly,  well-appearing             HEENT:  Unremarkable              Neck:                           no JVD, no bruits, no adenopathy              Chest:                          clear to auscultation, symmetrical breath sounds, no wheezes, no rhonchi              CV:                              RRR, grade IV/VI crescendo/decrescendo murmur heard best at RUSB,  no diastolic murmur             Abdomen:                    soft, non-tender, no masses              Extremities:                 warm, well-perfused, pulses diminished, no LE edema             Rectal/GU                   Deferred             Neuro:                         Grossly non-focal and symmetrical throughout             Skin:                            Clean and dry, no rashes, no breakdown    Diagnostic Tests:  EKG:   NSR w/out significant AV conduction delay (03/11/2020)     ECHOCARDIOGRAM REPORT    Patient Name:  Vicki Mallet Date of Exam: 02/29/2020  Medical Rec #: [id number]    Height:    63.0 in  Accession #:  [id number]   Weight:    142.6 lb  Date of Birth: 12-07-1940   BSA:     1.675 m  Patient Age:  45 years    BP:      188/89 mmHg  Patient Gender: F        HR:      97 bpm.  Exam Location: [address]   Procedure: 2D Echo, Cardiac Doppler and Color Doppler   Indications:  R06.00 SOB    History:    Patient has prior history of Echocardiogram examinations,  most         recent 01/01/2019. PAD and Breast Cancer, CKD; Risk         Factors:Diabetes, Hypertension and HLD.    Sonographer:  Marygrace Drought RCS  Referring Phys: [place]    1. The AoV is not well visualized but appears severely calcified with  restricted leaflet  motion. Vmax 3.5 m/s, MG 33 mmHG, AVA 0.66 cm2, DI  0.26. The LV cavity is small and SVi = 34 cc/m2. Finding could represent  low flow low gradient severe AS, and  would recommend either TEE or  aortic valve calcium score for clarification  if clinically indicated. The aortic valve is calcified. There is severe  calcifcation of the aortic valve. There is severe thickening of the aortic  valve. Aortic valve  regurgitation is not visualized. Moderate to severe aortic valve stenosis.  2. MR is now moderate and likely worsened in the setting of AS  progression. The mitral valve is grossly normal. Moderate mitral valve  regurgitation. No evidence of mitral stenosis.  3. Left ventricular ejection fraction, by estimation, is 65 to 70%. The  left ventricle has normal function. The left ventricle has no regional  wall motion abnormalities. There is moderate concentric left ventricular  hypertrophy. Left ventricular  diastolic function could not be evaluated.  4. Right ventricular systolic function is normal. The right ventricular  size is normal. Tricuspid regurgitation signal is inadequate for assessing  PA pressure.  5. The inferior vena cava is normal in size with greater than 50%  respiratory variability, suggesting right atrial pressure of 3 mmHg.   Comparison(s): Changes from prior study are noted. Moderate to severe AS  is now present. Moderate MR is now present. LV function remains similar.   FINDINGS  Left Ventricle: Left ventricular ejection fraction, by estimation, is 65  to 70%. The left ventricle has normal function. The left ventricle has no  regional wall motion abnormalities. The left ventricular internal cavity  size was normal in size. There is  moderate concentric left ventricular hypertrophy. Left ventricular  diastolic function could not be evaluated due to mitral regurgitation  (moderate or greater). Left ventricular diastolic function could not be  evaluated.    Right Ventricle: The right ventricular size is normal. No increase in  right ventricular wall thickness. Right ventricular systolic function is  normal. Tricuspid regurgitation signal is inadequate for assessing PA  pressure.   Left Atrium: Left atrial size was normal in size.   Right Atrium: Right atrial size was normal in size.   Pericardium: Trivial pericardial effusion is present. Presence of  pericardial fat pad.   Mitral Valve: MR is now moderate and likely worsened in the setting of AS  progression. The mitral valve is grossly normal. Mild mitral annular  calcification. Moderate mitral valve regurgitation. No evidence of mitral  valve stenosis.   Tricuspid Valve: The tricuspid valve is grossly normal. Tricuspid valve  regurgitation is trivial. No evidence of tricuspid stenosis.   Aortic Valve: The AoV is not well visualized but appears severely  calcified with restricted leaflet motion. Vmax 3.5 m/s, MG 33 mmHG, AVA  0.66 cm2, DI 0.26. The LV cavity is small and SVi = 34 cc/m2. Finding  could represent low flow low gradient severe  AS, and would recommend either TEE or aortic valve calcium score for  clarification if clinically indicated. The aortic valve is calcified.  There is severe calcifcation of the aortic valve. There is severe  thickening of the aortic valve. Aortic valve  regurgitation is not visualized. Moderate to severe aortic stenosis is  present. Aortic valve mean gradient measures 33.0 mmHg. Aortic valve peak  gradient measures 48.7 mmHg. Aortic valve area, by VTI measures 0.66 cm.   Pulmonic Valve: The pulmonic valve was grossly normal. Pulmonic valve  regurgitation is not visualized. No evidence of pulmonic stenosis.   Aorta: The aortic root and ascending aorta are structurally normal, with  no evidence of dilitation.   Venous: The inferior vena cava is normal in size with greater than 50%  respiratory variability, suggesting right atrial  pressure  of 3 mmHg.   IAS/Shunts: The atrial septum is grossly normal.     LEFT VENTRICLE  PLAX 2D  LVIDd:     4.10 cm Diastology  LVIDs:     2.60 cm LV e' medial:  5.55 cm/s  LV PW:     1.40 cm LV E/e' medial: 27.4  LV IVS:    1.38 cm LV e' lateral:  4.24 cm/s  LVOT diam:   1.81 cm LV E/e' lateral: 35.8  LV SV:     57  LV SV Index:  34  LVOT Area:   2.57 cm     RIGHT VENTRICLE  RV Basal diam: 2.30 cm  RV S prime:   9.57 cm/s  TAPSE (M-mode): 2.3 cm   LEFT ATRIUM       Index    RIGHT ATRIUM     Index  LA diam:    3.30 cm 1.97 cm/m RA Area:   8.07 cm  LA Vol (A2C):  35.9 ml 21.44 ml/m RA Volume:  14.80 ml 8.84 ml/m  LA Vol (A4C):  36.7 ml 21.91 ml/m  LA Biplane Vol: 36.1 ml 21.56 ml/m  AORTIC VALVE  AV Area (Vmax):  0.61 cm  AV Area (Vmean):  0.54 cm  AV Area (VTI):   0.66 cm  AV Vmax:      349.00 cm/s  AV Vmean:     265.000 cm/s  AV VTI:      0.864 m  AV Peak Grad:   48.7 mmHg  AV Mean Grad:   33.0 mmHg  LVOT Vmax:     82.50 cm/s  LVOT Vmean:    55.700 cm/s  LVOT VTI:     0.223 m  LVOT/AV VTI ratio: 0.26    AORTA  Ao Root diam: 3.20 cm  Ao Asc diam: 2.90 cm   MITRAL VALVE  MV Area (PHT):        SHUNTS  MV Decel Time:        Systemic VTI: 0.22 m  MR Peak grad:  172.9 mmHg Systemic Diam: 1.81 cm  MR Mean grad:  108.5 mmHg  MR Vmax:     657.50 cm/s  MR Vmean:    477.5 cm/s  MR PISA:     2.65 cm  MR PISA Eff ROA: 12 mm  MR PISA Radius: 0.65 cm  MV E velocity: 152.00 cm/s  MV A velocity: 168.00 cm/s  MV E/A ratio: 0.90   Eleonore Chiquito MD  Electronically signed by Eleonore Chiquito MD  Signature Date/Time: 02/29/2020/10:45:35 AM       RIGHT/LEFT HEART CATH AND CORONARY ANGIOGRAPHY    Conclusion    Ost RCA to Prox RCA lesion is 80% stenosed.  Mid RCA lesion is 30% stenosed.  RPDA lesion is 60%  stenosed.  1st Mrg lesion is 50% stenosed.  Prox Cx to Mid Cx lesion is 80% stenosed.  Mid LAD lesion is 80% stenosed.  1. No significant disease in the left main artery 2. Severe mid LAD stenosis. The LAD bifurcated distally beyond this lesion and the diagonal branch reaches the apex.  3. Moderate caliber non-dominant Circumflex with severe mid stenosis. The vessel is relatively small beyond the stenosis. The small to moderate caliber obtuse marginal branch arises prior to the stenosis and has moderate ostial stenosis.  4. The RCA is a large dominant artery. There is severe, heavily calcified stenosis at the ostium of the RCA. The distal stented segment is patent. There is moderately  severe stenosis in the moderate caliber PDA.  5. Severe aortic stenosis (mean gradient 26.9 mmHg, peak gradient 34 mmHg)  Recommendations: She likely has paradoxical low flow/low gradient aortic. I had hoped we could proceed with TAVR, however, given the severe disease in her coronary arteries, we will have to explore surgical AVR/CABG as an option. The ostial RCA stenosis is heavily calcified and would require orbital atherectomy and would high risk for complications. The LAD stenosis could be easily treated with PCI/stenting. She is a very functional 80 yo female and I think she may be a candidate for CABG with AVR. We will let her go home today as there are no critical coronary lesions. We will plan her pre TAVR CT scans and will have her seen by one of our CT surgeons on the valve team.    Indications  Severe aortic stenosis [I35.0 (ICD-10-CM)]   Procedural Details  Technical Details Indication: 80 yo female with history of CAD, severe aortic stenosis with recent progressive dyspnea and fatigue. She was seen for TAVR workup.   Procedure: The risks, benefits, complications, treatment options, and expected outcomes were discussed with the patient. The patient and/or family concurred with the  proposed plan, giving informed consent. The patient was brought to the cath lab after IV hydration was given. The patient was sedated with Versed and Fentanyl. The IV catheter in the right antecubital vein was changed for a 5 [place] sheath. Right heart catheterization performed with a balloon tipped catheter. The right wrist was prepped and draped in a sterile fashion. 1% lidocaine was used for local anesthesia. Using the modified Seldinger access technique, a 5 French sheath was placed in the right radial artery. 3 mg Verapamil was given through the sheath. 3500 units IV heparin was given. Standard diagnostic catheters were used to perform selective coronary angiography. I crossed the aortic valve with an AL-1 catheter and straight wire. LV pressures measured. The sheath was removed from the right radial artery and a Terumo hemostasis band was applied at the arteriotomy site on the right wrist.    Estimated blood loss <50 mL.   During this procedure medications were administered to achieve and maintain moderate conscious sedation while the patient's heart rate, blood pressure, and oxygen saturation were continuously monitored and I was present face-to-face 100% of this time.   Medications (Filter: Administrations occurring from 1153 to 1256 on 03/17/20) (important) Continuous medications are totaled by the amount administered until 03/17/20 1256.    midazolam (VERSED) injection (mg) Total dose:  1 mg  Date/Time Rate/Dose/Volume Action   03/17/20 1212 1 mg Given    fentaNYL (SUBLIMAZE) injection (mcg) Total dose:  25 mcg  Date/Time Rate/Dose/Volume Action   03/17/20 1212 25 mcg Given    lidocaine (PF) (XYLOCAINE) 1 % injection (mL) Total volume:  4 mL  Date/Time Rate/Dose/Volume Action   03/17/20 1226 2 mL Given   1226 2 mL Given    Radial Cocktail/Verapamil only (mL) Total volume:  10 mL  Date/Time Rate/Dose/Volume Action   03/17/20 1227 10 mL Given     heparin sodium (porcine) injection (Units) Total dose:  3,500 Units  Date/Time Rate/Dose/Volume Action   03/17/20 1234 3,500 Units Given    iohexol (OMNIPAQUE) 350 MG/ML injection (mL) Total volume:  55 mL  Date/Time Rate/Dose/Volume Action   03/17/20 1250 55 mL Given    Heparin (Porcine) in NaCl 1000-0.9 UT/500ML-% SOLN (mL) Total volume:  1,000 mL  Date/Time Rate/Dose/Volume Action   03/17/20 1254 500 mL  Given   1254 500 mL Given    Sedation Time  Sedation Time Physician-1: 35 minutes 28 seconds   Contrast  Medication Name Total Dose  iohexol (OMNIPAQUE) 350 MG/ML injection 55 mL    Radiation/Fluoro  Fluoro time: 6 (min) DAP: 9.5 (Gycm2) Cumulative Air Kerma: 469.6 (mGy)   Complications   Complications documented before study signed (03/17/2020 1:09 PM)     RIGHT/LEFT HEART CATH AND CORONARY ANGIOGRAPHY  None Documented by Burnell Blanks, MD 03/17/2020 1:02 PM  Date Found: 03/17/2020  Time Range: Intraprocedure       Coronary Findings   Diagnostic Dominance: Right  Left Anterior Descending  Vessel is large.  Mid LAD lesion is 80% stenosed.  Left Circumflex  Vessel is moderate in size.  Prox Cx to Mid Cx lesion is 80% stenosed.  First Obtuse Marginal Branch  1st Mrg lesion is 50% stenosed.  Right Coronary Artery  Vessel is large.  Ost RCA to Prox RCA lesion is 80% stenosed. The lesion is calcified.  Mid RCA lesion is 30% stenosed. The lesion was previously treated using a drug eluting stent over 2 years ago.  Right Posterior Descending Artery  RPDA lesion is 60% stenosed.   Intervention   No interventions have been documented.  Coronary Diagrams   Diagnostic Dominance: Right    Intervention    Implants    No implant documentation for this case.    Syngo Images  Show images for CARDIAC CATHETERIZATION  Images on Long Term Storage  Show images for  Jacqulin, Brandenburger to Procedure Log  Procedure Log     Hemo Data  Flowsheet Row Most Recent Value  Fick Cardiac Output 7.8 L/min  Fick Cardiac Output Index 4.68 (L/min)/BSA  Aortic Mean Gradient 26.85 mmHg  Aortic Peak Gradient 34 mmHg  Aortic Valve Area 1.27  Aortic Value Area Index 0.76 cm2/BSA  RA A Wave 8 mmHg  RA V Wave 3 mmHg  RA Mean 4 mmHg  RV Systolic Pressure 47 mmHg  RV Diastolic Pressure 3 mmHg  RV EDP 6 mmHg  PA Systolic Pressure 40 mmHg  PA Diastolic Pressure 16 mmHg  PA Mean 32 mmHg  PW A Wave 25 mmHg  PW V Wave 30 mmHg  PW Mean 22 mmHg  AO Systolic Pressure 295 mmHg  AO Diastolic Pressure 73 mmHg  AO Mean 284 mmHg  LV Systolic Pressure 132 mmHg  LV Diastolic Pressure 13 mmHg  LV EDP 20 mmHg  AOp Systolic Pressure 440 mmHg  AOp Diastolic Pressure 72 mmHg  AOp Mean Pressure 102 mmHg  LVp Systolic Pressure 725 mmHg  LVp Diastolic Pressure 21 mmHg  LVp EDP Pressure 26 mmHg  QP/QS 1  TPVR Index 6.84 HRUI  TSVR Index 24.59 HRUI  PVR SVR Ratio 0.09  TPVR/TSVR Ratio 0.28     CXR: Formal report of imaging from interpreting radiology remains pending at this time.  To my exam the lungs appear clear with continued resolution of the opacity in the right middle lobe.   Impression:  Patient has severe multivessel coronary artery disease and stage D3 severe low-flow, low gradient aortic stenosis.  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  She has not had any symptoms at rest.  I have personally reviewed the patient's recent EKG, transthoracic echocardiogram, diagnostic cardiac  catheterization and chest x-rays.  EKG reveals sinus rhythm with no significant AV conduction delay.  Echocardiogram reveals severe aortic stenosis with moderate mitral  regurgitation.  Left ventricular systolic function remains normal with ejection fraction estimated 65 to 70%.  There were no significant wall motion abnormalities.  The aortic valve is trileaflet with moderate to severely restricted leaflet mobility and calcification involving all 3 leaflets of the aortic valve.  Peak velocity across aortic valve measured 3.5 to 3.6 m/s corresponding to mean transvalvular gradient estimated 33 mmHg but aortic valve area calculated only 0.66 cm with DVI reported 0.26 and small left ventricular cavity volume with stroke-volume index 34 mL.  Diagnostic cardiac catheterization confirmed the presence of aortic stenosis and revealed mild to moderate pulmonary hypertension with severe multivessel coronary artery disease.  There was a long segment diffuse stenosis of the mid left anterior descending coronary artery as well as high-grade ostial stenosis of the right coronary artery.    Follow-up chest x-ray reveals resolution of the patient's previous right middle lobe pneumonia.  I agree the patient would probably best be treated with coronary artery bypass grafting and conventional surgical aortic valve replacement.  PCI and stenting with transcatheter aortic valve replacement could be considered as an option, but the ostial disease of the right coronary artery is heavily calcified and would not likely be easily treated with PCI and stenting.  Risks associated with surgical aortic valve replacement and coronary artery bypass grafting will be somewhat elevated because of the patient's age and comorbid medical problems.  Nonetheless, risks do not appear to be prohibitive, and long-term outcome would clearly be better with surgical intervention.    Plan:  The patient was again counseled at length regarding treatment alternatives for management of severe symptomatic aortic stenosis and multivessel coronary artery disease. Alternative approaches such as conventional aortic valve  replacement with coronary artery bypass grafting, transcatheter aortic valve replacement with multivessel PCI and stenting, and continued medical therapy without intervention were compared and contrasted at length.  The risks associated with conventional surgery were discussed in detail, as were expectations for post-operative convalescence, prosthetic valve choices, and the advantages of treating both the patient's aortic stenosis and coronary artery disease using definitive surgical intervention at the same time.  Concerns related to the suitability of the patient's coronary artery disease to PCI and stenting was discussed.  Issues specific to transcatheter aortic valve replacement were discussed including questions about long term valve durability, the potential for paravalvular leak, possible increased risk of need for permanent pacemaker placement, the suitability of the patient's surgical anatomy, and other potential technical complications related to the procedure itself.  Long-term prognosis without surgical intervention was discussed.  All treatment options were discussed in the context of the patient's own specific clinical presentation, past medical history, long-term prognosis and life expectancy.    The patient understands and accepts all potential associated risks of surgery including but not limited to risk of death, stroke, myocardial infarction, congestive heart failure, respiratory failure, renal failure, pneumonia, bleeding requiring blood transfusion and or reexploration, arrhythmia, heart block or bradycardia requiring permanent pacemaker, aortic dissection or other major vascular complication, pleural effusions or other delayed complications related to continued congestive heart failure, other late complications related to valve replacement including structural valve deterioration and failure, thrombosis, endocarditis, or paravalvular leak, as well as a late recurrence of symptomatic  ischemic heart disease.    Expectations for her postoperative convalescence have been discussed.  All of her questions have been  answered.    I spent in excess of 15 minutes during the conduct of this office consultation and >50% of this time involved direct face-to-face encounter with the patient for counseling and/or coordination of their care.    Valentina Gu. Roxy Manns, MD 05/02/2020 1:20 PM

## 2020-05-03 MED ORDER — SODIUM CHLORIDE 0.9 % IV SOLN
INTRAVENOUS | Status: DC
  Filled 2020-05-03: qty 30

## 2020-05-03 MED ORDER — POTASSIUM CHLORIDE 2 MEQ/ML IV SOLN
80.0000 meq | INTRAVENOUS | Status: DC
  Filled 2020-05-03: qty 40

## 2020-05-03 MED ORDER — PHENYLEPHRINE HCL-NACL 20-0.9 MG/250ML-% IV SOLN
30.0000 ug/min | INTRAVENOUS | Status: AC
Start: 2020-05-04 — End: 2020-05-04
  Administered 2020-05-04: 25 ug/min via INTRAVENOUS
  Filled 2020-05-03: qty 250

## 2020-05-03 MED ORDER — TRANEXAMIC ACID (OHS) PUMP PRIME SOLUTION
2.0000 mg/kg | INTRAVENOUS | Status: DC
  Filled 2020-05-03: qty 1.34

## 2020-05-03 MED ORDER — NITROGLYCERIN IN D5W 200-5 MCG/ML-% IV SOLN
2.0000 ug/min | INTRAVENOUS | Status: DC
  Filled 2020-05-03 (×2): qty 250

## 2020-05-03 MED ORDER — TRANEXAMIC ACID 1000 MG/10ML IV SOLN
1.5000 mg/kg/h | INTRAVENOUS | Status: AC
  Administered 2020-05-04: 1.5 mg/kg/h via INTRAVENOUS
  Filled 2020-05-03: qty 25

## 2020-05-03 MED ORDER — SODIUM CHLORIDE 0.9 % IV SOLN
750.0000 mg | INTRAVENOUS | Status: AC
  Administered 2020-05-04: 750 mg via INTRAVENOUS
  Filled 2020-05-03: qty 750

## 2020-05-03 MED ORDER — NOREPINEPHRINE 4 MG/250ML-% IV SOLN
0.0000 ug/min | INTRAVENOUS | Status: DC
Start: 2020-05-04 — End: 2020-05-04
  Filled 2020-05-03: qty 250

## 2020-05-03 MED ORDER — TRANEXAMIC ACID (OHS) BOLUS VIA INFUSION
15.0000 mg/kg | INTRAVENOUS | Status: AC
  Administered 2020-05-04: 1008 mg via INTRAVENOUS
  Filled 2020-05-03: qty 1008

## 2020-05-03 MED ORDER — EPINEPHRINE HCL 5 MG/250ML IV SOLN IN NS
0.0000 ug/min | INTRAVENOUS | Status: AC
  Administered 2020-05-04: 1 ug/min via INTRAVENOUS
  Filled 2020-05-03: qty 250

## 2020-05-03 MED ORDER — MILRINONE LACTATE IN DEXTROSE 20-5 MG/100ML-% IV SOLN
0.3000 ug/kg/min | INTRAVENOUS | Status: AC
  Administered 2020-05-04: .2 ug/kg/min via INTRAVENOUS
  Filled 2020-05-03: qty 100

## 2020-05-03 MED ORDER — SODIUM CHLORIDE 0.9 % IV SOLN
1.5000 g | INTRAVENOUS | Status: AC
  Administered 2020-05-04: 1.5 g via INTRAVENOUS
  Filled 2020-05-03: qty 1.5

## 2020-05-03 MED ORDER — VANCOMYCIN HCL 1000 MG IV SOLR
INTRAVENOUS | Status: DC
  Filled 2020-05-03: qty 1000

## 2020-05-03 MED ORDER — INSULIN REGULAR(HUMAN) IN NACL 100-0.9 UT/100ML-% IV SOLN
INTRAVENOUS | Status: AC
  Administered 2020-05-04: 2.4 [IU]/h via INTRAVENOUS
  Filled 2020-05-03: qty 100

## 2020-05-03 MED ORDER — PLASMA-LYTE 148 IV SOLN
INTRAVENOUS | Status: DC
  Filled 2020-05-03: qty 2.5

## 2020-05-03 MED ORDER — VANCOMYCIN HCL 1250 MG/250ML IV SOLN
1250.0000 mg | INTRAVENOUS | Status: AC
  Administered 2020-05-04: 1250 mg via INTRAVENOUS
  Filled 2020-05-03: qty 250

## 2020-05-03 MED ORDER — DEXMEDETOMIDINE HCL IN NACL 400 MCG/100ML IV SOLN
0.1000 ug/kg/h | INTRAVENOUS | Status: AC
  Administered 2020-05-04: .4 ug/kg/h via INTRAVENOUS
  Filled 2020-05-03: qty 100

## 2020-05-03 MED ORDER — MANNITOL 20 % IV SOLN
Freq: Once | INTRAVENOUS | Status: DC
  Filled 2020-05-03: qty 13

## 2020-05-03 MED ORDER — GLUTARALDEHYDE 0.625% SOAKING SOLUTION
TOPICAL | Status: DC
  Filled 2020-05-03: qty 50

## 2020-05-03 NOTE — Anesthesia Preprocedure Evaluation (Addendum)
Anesthesia Evaluation  Patient identified by MRN, date of birth, ID band Patient awake    Reviewed: Allergy & Precautions, NPO status , Patient's Chart, lab work & pertinent test results  Airway Mallampati: II  TM Distance: >3 FB Neck ROM: Full    Dental  (+) Edentulous Upper, Dental Advisory Given   Pulmonary pneumonia, former smoker,    Pulmonary exam normal breath sounds clear to auscultation       Cardiovascular hypertension, + CAD, + Cardiac Stents and + Peripheral Vascular Disease  + Valvular Problems/Murmurs AS and MR  Rhythm:Regular Rate:Normal + Systolic murmurs Echo 04/1322 1. The AoV is not well visualized but appears severely calcified with restricted leaflet motion. Vmax 3.5 m/s, MG 33 mmHG, AVA 0.66 cm2, DI 0.26. The LV cavity is small and SVi = 34 cc/m2. Finding could represent low flow low gradient severe AS, and would recommend either TEE or aortic valve calcium score for clarification if clinically indicated. The aortic valve is calcified. There is severe calcifcation of the aortic valve. There is severe thickening of the aortic valve. Aortic valve regurgitation is not visualized. Moderate to severe aortic valve stenosis.  2. MR is now moderate and likely worsened in the setting of AS progression. The mitral valve is grossly normal. Moderate mitral valve regurgitation. No evidence of mitral stenosis.  3. Left ventricular ejection fraction, by estimation, is 65 to 70%. The left ventricle has normal function. The left ventricle has no regional wall motion abnormalities. There is moderate concentric left ventricular hypertrophy. Left ventricular diastolic function could not be evaluated.  4. Right ventricular systolic function is normal. The right ventricular size is normal. Tricuspid regurgitation signal is inadequate for assessing PA pressure.  5. The inferior vena cava is normal in size with greater than 50% respiratory  variability, suggesting right atrial pressure of 3 mmHg.    Neuro/Psych  Headaches, PSYCHIATRIC DISORDERS Anxiety Depression    GI/Hepatic GERD  ,(+) Hepatitis -  Endo/Other  diabetesHypothyroidism   Renal/GU Renal disease     Musculoskeletal  (+) Arthritis ,   Abdominal   Peds  Hematology  (+) Blood dyscrasia, anemia ,   Anesthesia Other Findings   Reproductive/Obstetrics                           Anesthesia Physical  Anesthesia Plan  ASA: IV  Anesthesia Plan: General   Post-op Pain Management:    Induction: Intravenous  PONV Risk Score and Plan: 4 or greater and Midazolam and Treatment may vary due to age or medical condition  Airway Management Planned: Oral ETT  Additional Equipment: Arterial line, CVP, PA Cath, TEE and Ultrasound Guidance Line Placement  Intra-op Plan: Utilization Of Total Body Hypothermia per surgeon request  Post-operative Plan: Post-operative intubation/ventilation  Informed Consent: I have reviewed the patients History and Physical, chart, labs and discussed the procedure including the risks, benefits and alternatives for the proposed anesthesia with the patient or authorized representative who has indicated his/her understanding and acceptance.     Dental advisory given  Plan Discussed with: CRNA  Anesthesia Plan Comments:        Anesthesia Quick Evaluation

## 2020-05-04 ENCOUNTER — Other Ambulatory Visit: Payer: Self-pay

## 2020-05-04 ENCOUNTER — Inpatient Hospital Stay (HOSPITAL_COMMUNITY): Payer: Medicare Other

## 2020-05-04 ENCOUNTER — Inpatient Hospital Stay (HOSPITAL_COMMUNITY): Payer: Medicare Other | Admitting: Anesthesiology

## 2020-05-04 ENCOUNTER — Encounter (HOSPITAL_COMMUNITY): Payer: Self-pay | Admitting: Thoracic Surgery (Cardiothoracic Vascular Surgery)

## 2020-05-04 ENCOUNTER — Inpatient Hospital Stay (HOSPITAL_COMMUNITY)
Admission: RE | Admit: 2020-05-04 | Discharge: 2020-05-09 | DRG: 220 | Disposition: A | Discharge status: Home / Self Care | Payer: Medicare Other | Attending: Thoracic Surgery (Cardiothoracic Vascular Surgery) | Admitting: Thoracic Surgery (Cardiothoracic Vascular Surgery)

## 2020-05-04 ENCOUNTER — Encounter (HOSPITAL_COMMUNITY)
Admission: RE | Disposition: A | Discharge status: Home / Self Care | Payer: Self-pay | Source: Home / Self Care | Attending: Thoracic Surgery (Cardiothoracic Vascular Surgery)

## 2020-05-04 DIAGNOSIS — Z794 Long term (current) use of insulin: Secondary | ICD-10-CM | POA: Diagnosis present

## 2020-05-04 DIAGNOSIS — Z20822 Contact with and (suspected) exposure to covid-19: Secondary | ICD-10-CM | POA: Diagnosis present

## 2020-05-04 DIAGNOSIS — Z833 Family history of diabetes mellitus: Secondary | ICD-10-CM | POA: Diagnosis not present

## 2020-05-04 DIAGNOSIS — N184 Chronic kidney disease, stage 4 (severe): Secondary | ICD-10-CM | POA: Diagnosis present

## 2020-05-04 DIAGNOSIS — E119 Type 2 diabetes mellitus without complications: Secondary | ICD-10-CM | POA: Diagnosis present

## 2020-05-04 DIAGNOSIS — I10 Essential (primary) hypertension: Secondary | ICD-10-CM | POA: Diagnosis present

## 2020-05-04 DIAGNOSIS — Z87891 Personal history of nicotine dependence: Secondary | ICD-10-CM

## 2020-05-04 DIAGNOSIS — Z811 Family history of alcohol abuse and dependence: Secondary | ICD-10-CM | POA: Diagnosis not present

## 2020-05-04 DIAGNOSIS — I454 Nonspecific intraventricular block: Secondary | ICD-10-CM | POA: Diagnosis not present

## 2020-05-04 DIAGNOSIS — Z79899 Other long term (current) drug therapy: Secondary | ICD-10-CM

## 2020-05-04 DIAGNOSIS — I2510 Atherosclerotic heart disease of native coronary artery without angina pectoris: Secondary | ICD-10-CM | POA: Diagnosis present

## 2020-05-04 DIAGNOSIS — F32A Depression, unspecified: Secondary | ICD-10-CM | POA: Diagnosis present

## 2020-05-04 DIAGNOSIS — J9811 Atelectasis: Secondary | ICD-10-CM

## 2020-05-04 DIAGNOSIS — D509 Iron deficiency anemia, unspecified: Secondary | ICD-10-CM | POA: Diagnosis present

## 2020-05-04 DIAGNOSIS — Z006 Encounter for examination for normal comparison and control in clinical research program: Secondary | ICD-10-CM | POA: Diagnosis not present

## 2020-05-04 DIAGNOSIS — Z9889 Other specified postprocedural states: Secondary | ICD-10-CM

## 2020-05-04 DIAGNOSIS — F419 Anxiety disorder, unspecified: Secondary | ICD-10-CM | POA: Diagnosis present

## 2020-05-04 DIAGNOSIS — Z807 Family history of other malignant neoplasms of lymphoid, hematopoietic and related tissues: Secondary | ICD-10-CM | POA: Diagnosis not present

## 2020-05-04 DIAGNOSIS — I130 Hypertensive heart and chronic kidney disease with heart failure and stage 1 through stage 4 chronic kidney disease, or unspecified chronic kidney disease: Secondary | ICD-10-CM | POA: Diagnosis present

## 2020-05-04 DIAGNOSIS — E1129 Type 2 diabetes mellitus with other diabetic kidney complication: Secondary | ICD-10-CM | POA: Diagnosis present

## 2020-05-04 DIAGNOSIS — Z885 Allergy status to narcotic agent status: Secondary | ICD-10-CM

## 2020-05-04 DIAGNOSIS — Z7984 Long term (current) use of oral hypoglycemic drugs: Secondary | ICD-10-CM | POA: Diagnosis not present

## 2020-05-04 DIAGNOSIS — E785 Hyperlipidemia, unspecified: Secondary | ICD-10-CM | POA: Diagnosis present

## 2020-05-04 DIAGNOSIS — Z880 Allergy status to penicillin: Secondary | ICD-10-CM | POA: Diagnosis not present

## 2020-05-04 DIAGNOSIS — Z7982 Long term (current) use of aspirin: Secondary | ICD-10-CM

## 2020-05-04 DIAGNOSIS — E1140 Type 2 diabetes mellitus with diabetic neuropathy, unspecified: Secondary | ICD-10-CM | POA: Diagnosis present

## 2020-05-04 DIAGNOSIS — E039 Hypothyroidism, unspecified: Secondary | ICD-10-CM | POA: Diagnosis present

## 2020-05-04 DIAGNOSIS — I5032 Chronic diastolic (congestive) heart failure: Secondary | ICD-10-CM | POA: Diagnosis present

## 2020-05-04 DIAGNOSIS — E1151 Type 2 diabetes mellitus with diabetic peripheral angiopathy without gangrene: Secondary | ICD-10-CM | POA: Diagnosis present

## 2020-05-04 DIAGNOSIS — Z952 Presence of prosthetic heart valve: Secondary | ICD-10-CM

## 2020-05-04 DIAGNOSIS — K219 Gastro-esophageal reflux disease without esophagitis: Secondary | ICD-10-CM | POA: Diagnosis present

## 2020-05-04 DIAGNOSIS — N189 Chronic kidney disease, unspecified: Secondary | ICD-10-CM | POA: Diagnosis present

## 2020-05-04 DIAGNOSIS — D6959 Other secondary thrombocytopenia: Secondary | ICD-10-CM | POA: Diagnosis not present

## 2020-05-04 DIAGNOSIS — Z808 Family history of malignant neoplasm of other organs or systems: Secondary | ICD-10-CM

## 2020-05-04 DIAGNOSIS — N1832 Chronic kidney disease, stage 3b: Secondary | ICD-10-CM | POA: Diagnosis present

## 2020-05-04 DIAGNOSIS — Z953 Presence of xenogenic heart valve: Secondary | ICD-10-CM

## 2020-05-04 DIAGNOSIS — D62 Acute posthemorrhagic anemia: Secondary | ICD-10-CM | POA: Diagnosis not present

## 2020-05-04 DIAGNOSIS — Z8249 Family history of ischemic heart disease and other diseases of the circulatory system: Secondary | ICD-10-CM | POA: Diagnosis not present

## 2020-05-04 DIAGNOSIS — E1122 Type 2 diabetes mellitus with diabetic chronic kidney disease: Secondary | ICD-10-CM | POA: Diagnosis present

## 2020-05-04 DIAGNOSIS — I350 Nonrheumatic aortic (valve) stenosis: Principal | ICD-10-CM

## 2020-05-04 DIAGNOSIS — Z882 Allergy status to sulfonamides status: Secondary | ICD-10-CM | POA: Diagnosis not present

## 2020-05-04 DIAGNOSIS — E7800 Pure hypercholesterolemia, unspecified: Secondary | ICD-10-CM | POA: Diagnosis present

## 2020-05-04 DIAGNOSIS — N183 Chronic kidney disease, stage 3 unspecified: Secondary | ICD-10-CM | POA: Diagnosis present

## 2020-05-04 DIAGNOSIS — Z4682 Encounter for fitting and adjustment of non-vascular catheter: Secondary | ICD-10-CM

## 2020-05-04 DIAGNOSIS — Z951 Presence of aortocoronary bypass graft: Secondary | ICD-10-CM

## 2020-05-04 DIAGNOSIS — Z888 Allergy status to other drugs, medicaments and biological substances status: Secondary | ICD-10-CM

## 2020-05-04 HISTORY — DX: Presence of aortocoronary bypass graft: Z95.1

## 2020-05-04 HISTORY — PX: TEE WITHOUT CARDIOVERSION: SHX5443

## 2020-05-04 HISTORY — PX: AORTIC VALVE REPLACEMENT: SHX41

## 2020-05-04 HISTORY — PX: CORONARY ARTERY BYPASS GRAFT: SHX141

## 2020-05-04 HISTORY — DX: Presence of xenogenic heart valve: Z95.3

## 2020-05-04 LAB — POCT I-STAT, CHEM 8
BUN: 29 mg/dL — ABNORMAL HIGH (ref 8–23)
BUN: 29 mg/dL — ABNORMAL HIGH (ref 8–23)
BUN: 28 mg/dL — ABNORMAL HIGH (ref 8–23)
BUN: 27 mg/dL — ABNORMAL HIGH (ref 8–23)
BUN: 28 mg/dL — ABNORMAL HIGH (ref 8–23)
Calcium, Ion: 1.27 mmol/L (ref 1.15–1.40)
Calcium, Ion: 1.28 mmol/L (ref 1.15–1.40)
Calcium, Ion: 1.08 mmol/L — ABNORMAL LOW (ref 1.15–1.40)
Calcium, Ion: 1.04 mmol/L — ABNORMAL LOW (ref 1.15–1.40)
Calcium, Ion: 1.14 mmol/L — ABNORMAL LOW (ref 1.15–1.40)
Chloride: 103 mmol/L (ref 98–111)
Chloride: 103 mmol/L (ref 98–111)
Chloride: 103 mmol/L (ref 98–111)
Chloride: 103 mmol/L (ref 98–111)
Chloride: 104 mmol/L (ref 98–111)
Creatinine, Ser: 1.5 mg/dL — ABNORMAL HIGH (ref 0.44–1.00)
Creatinine, Ser: 1.4 mg/dL — ABNORMAL HIGH (ref 0.44–1.00)
Creatinine, Ser: 1.3 mg/dL — ABNORMAL HIGH (ref 0.44–1.00)
Creatinine, Ser: 1.3 mg/dL — ABNORMAL HIGH (ref 0.44–1.00)
Creatinine, Ser: 1.2 mg/dL — ABNORMAL HIGH (ref 0.44–1.00)
Glucose, Bld: 159 mg/dL — ABNORMAL HIGH (ref 70–99)
Glucose, Bld: 128 mg/dL — ABNORMAL HIGH (ref 70–99)
Glucose, Bld: 109 mg/dL — ABNORMAL HIGH (ref 70–99)
Glucose, Bld: 125 mg/dL — ABNORMAL HIGH (ref 70–99)
Glucose, Bld: 161 mg/dL — ABNORMAL HIGH (ref 70–99)
HCT: 32 % — ABNORMAL LOW (ref 36.0–46.0)
HCT: 28 % — ABNORMAL LOW (ref 36.0–46.0)
HCT: 20 % — ABNORMAL LOW (ref 36.0–46.0)
HCT: 20 % — ABNORMAL LOW (ref 36.0–46.0)
HCT: 27 % — ABNORMAL LOW (ref 36.0–46.0)
Hemoglobin: 10.9 g/dL — ABNORMAL LOW (ref 12.0–15.0)
Hemoglobin: 9.5 g/dL — ABNORMAL LOW (ref 12.0–15.0)
Hemoglobin: 6.8 g/dL — CL (ref 12.0–15.0)
Hemoglobin: 6.8 g/dL — CL (ref 12.0–15.0)
Hemoglobin: 9.2 g/dL — ABNORMAL LOW (ref 12.0–15.0)
Potassium: 4.2 mmol/L (ref 3.5–5.1)
Potassium: 4.1 mmol/L (ref 3.5–5.1)
Potassium: 4.9 mmol/L (ref 3.5–5.1)
Potassium: 4.8 mmol/L (ref 3.5–5.1)
Potassium: 4.3 mmol/L (ref 3.5–5.1)
Sodium: 139 mmol/L (ref 135–145)
Sodium: 138 mmol/L (ref 135–145)
Sodium: 139 mmol/L (ref 135–145)
Sodium: 139 mmol/L (ref 135–145)
Sodium: 139 mmol/L (ref 135–145)
TCO2: 26 mmol/L (ref 22–32)
TCO2: 25 mmol/L (ref 22–32)
TCO2: 27 mmol/L (ref 22–32)
TCO2: 27 mmol/L (ref 22–32)
TCO2: 25 mmol/L (ref 22–32)

## 2020-05-04 LAB — CBC
HCT: 30.6 % — ABNORMAL LOW (ref 36.0–46.0)
HCT: 28.1 % — ABNORMAL LOW (ref 36.0–46.0)
Hemoglobin: 10.2 g/dL — ABNORMAL LOW (ref 12.0–15.0)
Hemoglobin: 9.4 g/dL — ABNORMAL LOW (ref 12.0–15.0)
MCH: 30.5 pg (ref 26.0–34.0)
MCH: 30.4 pg (ref 26.0–34.0)
MCHC: 33.3 g/dL (ref 30.0–36.0)
MCHC: 33.5 g/dL (ref 30.0–36.0)
MCV: 91.6 fL (ref 80.0–100.0)
MCV: 90.9 fL (ref 80.0–100.0)
Platelets: 119 10*3/uL — ABNORMAL LOW (ref 150–400)
Platelets: 101 10*3/uL — ABNORMAL LOW (ref 150–400)
RBC: 3.34 MIL/uL — ABNORMAL LOW (ref 3.87–5.11)
RBC: 3.09 MIL/uL — ABNORMAL LOW (ref 3.87–5.11)
RDW: 14.7 % (ref 11.5–15.5)
RDW: 15.1 % (ref 11.5–15.5)
WBC: 15.2 10*3/uL — ABNORMAL HIGH (ref 4.0–10.5)
WBC: 11.4 10*3/uL — ABNORMAL HIGH (ref 4.0–10.5)
nRBC: 0 % (ref 0.0–0.2)
nRBC: 0 % (ref 0.0–0.2)

## 2020-05-04 LAB — POCT I-STAT 7, (LYTES, BLD GAS, ICA,H+H)
Acid-Base Excess: 0 mmol/L (ref 0.0–2.0)
Acid-Base Excess: 3 mmol/L — ABNORMAL HIGH (ref 0.0–2.0)
Acid-Base Excess: 2 mmol/L (ref 0.0–2.0)
Acid-Base Excess: 0 mmol/L (ref 0.0–2.0)
Acid-base deficit: 1 mmol/L (ref 0.0–2.0)
Acid-base deficit: 5 mmol/L — ABNORMAL HIGH (ref 0.0–2.0)
Acid-base deficit: 6 mmol/L — ABNORMAL HIGH (ref 0.0–2.0)
Acid-base deficit: 7 mmol/L — ABNORMAL HIGH (ref 0.0–2.0)
Acid-base deficit: 3 mmol/L — ABNORMAL HIGH (ref 0.0–2.0)
Bicarbonate: 25.8 mmol/L (ref 20.0–28.0)
Bicarbonate: 28 mmol/L (ref 20.0–28.0)
Bicarbonate: 27.1 mmol/L (ref 20.0–28.0)
Bicarbonate: 23.2 mmol/L (ref 20.0–28.0)
Bicarbonate: 23.9 mmol/L (ref 20.0–28.0)
Bicarbonate: 21.8 mmol/L (ref 20.0–28.0)
Bicarbonate: 20.1 mmol/L (ref 20.0–28.0)
Bicarbonate: 19.3 mmol/L — ABNORMAL LOW (ref 20.0–28.0)
Bicarbonate: 22.9 mmol/L (ref 20.0–28.0)
Calcium, Ion: 1.26 mmol/L (ref 1.15–1.40)
Calcium, Ion: 0.96 mmol/L — ABNORMAL LOW (ref 1.15–1.40)
Calcium, Ion: 1.08 mmol/L — ABNORMAL LOW (ref 1.15–1.40)
Calcium, Ion: 0.99 mmol/L — ABNORMAL LOW (ref 1.15–1.40)
Calcium, Ion: 1.14 mmol/L — ABNORMAL LOW (ref 1.15–1.40)
Calcium, Ion: 1.17 mmol/L (ref 1.15–1.40)
Calcium, Ion: 1.12 mmol/L — ABNORMAL LOW (ref 1.15–1.40)
Calcium, Ion: 1.08 mmol/L — ABNORMAL LOW (ref 1.15–1.40)
Calcium, Ion: 1.11 mmol/L — ABNORMAL LOW (ref 1.15–1.40)
HCT: 34 % — ABNORMAL LOW (ref 36.0–46.0)
HCT: 23 % — ABNORMAL LOW (ref 36.0–46.0)
HCT: 22 % — ABNORMAL LOW (ref 36.0–46.0)
HCT: 26 % — ABNORMAL LOW (ref 36.0–46.0)
HCT: 28 % — ABNORMAL LOW (ref 36.0–46.0)
HCT: 28 % — ABNORMAL LOW (ref 36.0–46.0)
HCT: 26 % — ABNORMAL LOW (ref 36.0–46.0)
HCT: 24 % — ABNORMAL LOW (ref 36.0–46.0)
HCT: 27 % — ABNORMAL LOW (ref 36.0–46.0)
Hemoglobin: 11.6 g/dL — ABNORMAL LOW (ref 12.0–15.0)
Hemoglobin: 7.8 g/dL — ABNORMAL LOW (ref 12.0–15.0)
Hemoglobin: 7.5 g/dL — ABNORMAL LOW (ref 12.0–15.0)
Hemoglobin: 8.8 g/dL — ABNORMAL LOW (ref 12.0–15.0)
Hemoglobin: 9.5 g/dL — ABNORMAL LOW (ref 12.0–15.0)
Hemoglobin: 9.5 g/dL — ABNORMAL LOW (ref 12.0–15.0)
Hemoglobin: 8.8 g/dL — ABNORMAL LOW (ref 12.0–15.0)
Hemoglobin: 8.2 g/dL — ABNORMAL LOW (ref 12.0–15.0)
Hemoglobin: 9.2 g/dL — ABNORMAL LOW (ref 12.0–15.0)
O2 Saturation: 100 %
O2 Saturation: 100 %
O2 Saturation: 100 %
O2 Saturation: 100 %
O2 Saturation: 100 %
O2 Saturation: 92 %
O2 Saturation: 94 %
O2 Saturation: 93 %
O2 Saturation: 94 %
Patient temperature: 36.4
Patient temperature: 36.5
Potassium: 4.2 mmol/L (ref 3.5–5.1)
Potassium: 4 mmol/L (ref 3.5–5.1)
Potassium: 4.4 mmol/L (ref 3.5–5.1)
Potassium: 5.2 mmol/L — ABNORMAL HIGH (ref 3.5–5.1)
Potassium: 4.3 mmol/L (ref 3.5–5.1)
Potassium: 4 mmol/L (ref 3.5–5.1)
Potassium: 4 mmol/L (ref 3.5–5.1)
Potassium: 3.4 mmol/L — ABNORMAL LOW (ref 3.5–5.1)
Potassium: 3.7 mmol/L (ref 3.5–5.1)
Sodium: 138 mmol/L (ref 135–145)
Sodium: 139 mmol/L (ref 135–145)
Sodium: 140 mmol/L (ref 135–145)
Sodium: 139 mmol/L (ref 135–145)
Sodium: 140 mmol/L (ref 135–145)
Sodium: 140 mmol/L (ref 135–145)
Sodium: 140 mmol/L (ref 135–145)
Sodium: 143 mmol/L (ref 135–145)
Sodium: 143 mmol/L (ref 135–145)
TCO2: 27 mmol/L (ref 22–32)
TCO2: 29 mmol/L (ref 22–32)
TCO2: 28 mmol/L (ref 22–32)
TCO2: 24 mmol/L (ref 22–32)
TCO2: 25 mmol/L (ref 22–32)
TCO2: 23 mmol/L (ref 22–32)
TCO2: 21 mmol/L — ABNORMAL LOW (ref 22–32)
TCO2: 21 mmol/L — ABNORMAL LOW (ref 22–32)
TCO2: 24 mmol/L (ref 22–32)
pCO2 arterial: 48.2 mmHg — ABNORMAL HIGH (ref 32.0–48.0)
pCO2 arterial: 43.5 mmHg (ref 32.0–48.0)
pCO2 arterial: 44.3 mmHg (ref 32.0–48.0)
pCO2 arterial: 35.4 mmHg (ref 32.0–48.0)
pCO2 arterial: 36.2 mmHg (ref 32.0–48.0)
pCO2 arterial: 45 mmHg (ref 32.0–48.0)
pCO2 arterial: 42.2 mmHg (ref 32.0–48.0)
pCO2 arterial: 40.4 mmHg (ref 32.0–48.0)
pCO2 arterial: 43.5 mmHg (ref 32.0–48.0)
pH, Arterial: 7.336 — ABNORMAL LOW (ref 7.350–7.450)
pH, Arterial: 7.416 (ref 7.350–7.450)
pH, Arterial: 7.394 (ref 7.350–7.450)
pH, Arterial: 7.424 (ref 7.350–7.450)
pH, Arterial: 7.427 (ref 7.350–7.450)
pH, Arterial: 7.291 — ABNORMAL LOW (ref 7.350–7.450)
pH, Arterial: 7.283 — ABNORMAL LOW (ref 7.350–7.450)
pH, Arterial: 7.287 — ABNORMAL LOW (ref 7.350–7.450)
pH, Arterial: 7.329 — ABNORMAL LOW (ref 7.350–7.450)
pO2, Arterial: 490 mmHg — ABNORMAL HIGH (ref 83.0–108.0)
pO2, Arterial: 440 mmHg — ABNORMAL HIGH (ref 83.0–108.0)
pO2, Arterial: 430 mmHg — ABNORMAL HIGH (ref 83.0–108.0)
pO2, Arterial: 337 mmHg — ABNORMAL HIGH (ref 83.0–108.0)
pO2, Arterial: 368 mmHg — ABNORMAL HIGH (ref 83.0–108.0)
pO2, Arterial: 70 mmHg — ABNORMAL LOW (ref 83.0–108.0)
pO2, Arterial: 80 mmHg — ABNORMAL LOW (ref 83.0–108.0)
pO2, Arterial: 75 mmHg — ABNORMAL LOW (ref 83.0–108.0)
pO2, Arterial: 78 mmHg — ABNORMAL LOW (ref 83.0–108.0)

## 2020-05-04 LAB — POCT I-STAT EG7
Acid-Base Excess: 1 mmol/L (ref 0.0–2.0)
Bicarbonate: 26.5 mmol/L (ref 20.0–28.0)
Calcium, Ion: 1.07 mmol/L — ABNORMAL LOW (ref 1.15–1.40)
HCT: 24 % — ABNORMAL LOW (ref 36.0–46.0)
Hemoglobin: 8.2 g/dL — ABNORMAL LOW (ref 12.0–15.0)
O2 Saturation: 74 %
Potassium: 4 mmol/L (ref 3.5–5.1)
Sodium: 141 mmol/L (ref 135–145)
TCO2: 28 mmol/L (ref 22–32)
pCO2, Ven: 46.1 mmHg (ref 44.0–60.0)
pH, Ven: 7.367 (ref 7.250–7.430)
pO2, Ven: 41 mmHg (ref 32.0–45.0)

## 2020-05-04 LAB — PREPARE RBC (CROSSMATCH)

## 2020-05-04 LAB — GLUCOSE, CAPILLARY
Glucose-Capillary: 137 mg/dL — ABNORMAL HIGH (ref 70–99)
Glucose-Capillary: 154 mg/dL — ABNORMAL HIGH (ref 70–99)
Glucose-Capillary: 160 mg/dL — ABNORMAL HIGH (ref 70–99)
Glucose-Capillary: 176 mg/dL — ABNORMAL HIGH (ref 70–99)
Glucose-Capillary: 157 mg/dL — ABNORMAL HIGH (ref 70–99)
Glucose-Capillary: 161 mg/dL — ABNORMAL HIGH (ref 70–99)
Glucose-Capillary: 165 mg/dL — ABNORMAL HIGH (ref 70–99)
Glucose-Capillary: 142 mg/dL — ABNORMAL HIGH (ref 70–99)
Glucose-Capillary: 204 mg/dL — ABNORMAL HIGH (ref 70–99)

## 2020-05-04 LAB — BASIC METABOLIC PANEL
Anion gap: 4 — ABNORMAL LOW (ref 5–15)
BUN: 24 mg/dL — ABNORMAL HIGH (ref 8–23)
CO2: 25 mmol/L (ref 22–32)
Calcium: 7.5 mg/dL — ABNORMAL LOW (ref 8.9–10.3)
Chloride: 111 mmol/L (ref 98–111)
Creatinine, Ser: 1.3 mg/dL — ABNORMAL HIGH (ref 0.44–1.00)
GFR, Estimated: 42 mL/min — ABNORMAL LOW (ref >60)
Glucose, Bld: 169 mg/dL — ABNORMAL HIGH (ref 70–99)
Potassium: 3.7 mmol/L (ref 3.5–5.1)
Sodium: 140 mmol/L (ref 135–145)

## 2020-05-04 LAB — ECHO INTRAOPERATIVE TEE
AV Mean grad: 32 mmHg
Height: 63 in
Weight: 2370.39 oz

## 2020-05-04 LAB — PROTIME-INR
INR: 1.4 — ABNORMAL HIGH (ref 0.8–1.2)
Prothrombin Time: 17.6 seconds — ABNORMAL HIGH (ref 11.4–15.2)

## 2020-05-04 LAB — PLATELET COUNT: Platelets: 107 10*3/uL — ABNORMAL LOW (ref 150–400)

## 2020-05-04 LAB — APTT: aPTT: 36 seconds (ref 24–36)

## 2020-05-04 LAB — HEMOGLOBIN AND HEMATOCRIT, BLOOD
HCT: 26.9 % — ABNORMAL LOW (ref 36.0–46.0)
Hemoglobin: 8.6 g/dL — ABNORMAL LOW (ref 12.0–15.0)

## 2020-05-04 LAB — MAGNESIUM: Magnesium: 2.8 mg/dL — ABNORMAL HIGH (ref 1.7–2.4)

## 2020-05-04 SURGERY — REPLACEMENT, AORTIC VALVE, OPEN
Anesthesia: General | Site: Chest

## 2020-05-04 MED ORDER — TRAMADOL HCL 50 MG PO TABS
50.0000 mg | ORAL_TABLET | ORAL | Status: DC | PRN
  Administered 2020-05-05 – 2020-05-06 (×2): 100 mg via ORAL
  Filled 2020-05-04 (×2): qty 2

## 2020-05-04 MED ORDER — CHLORHEXIDINE GLUCONATE 0.12 % MT SOLN
15.0000 mL | OROMUCOSAL | Status: AC
  Administered 2020-05-04: 15 mL via OROMUCOSAL

## 2020-05-04 MED ORDER — 0.9 % SODIUM CHLORIDE (POUR BTL) OPTIME
TOPICAL | Status: DC | PRN
  Administered 2020-05-04: 5000 mL

## 2020-05-04 MED ORDER — LEVOTHYROXINE SODIUM 88 MCG PO TABS
88.0000 ug | ORAL_TABLET | Freq: Every day | ORAL | Status: DC
  Administered 2020-05-05 – 2020-05-09 (×5): 88 ug via ORAL
  Filled 2020-05-04 (×5): qty 1

## 2020-05-04 MED ORDER — SODIUM CHLORIDE 0.9 % IV SOLN: INTRAVENOUS | Status: DC

## 2020-05-04 MED ORDER — SODIUM CHLORIDE 0.9 % IV SOLN
1.5000 g | Freq: Two times a day (BID) | INTRAVENOUS | Status: AC
  Administered 2020-05-04 – 2020-05-06 (×4): 1.5 g via INTRAVENOUS
  Filled 2020-05-04 (×4): qty 1.5

## 2020-05-04 MED ORDER — PROTAMINE SULFATE 10 MG/ML IV SOLN
INTRAVENOUS | Status: DC | PRN
  Administered 2020-05-04: 10 mg via INTRAVENOUS
  Administered 2020-05-04: 40 mg via INTRAVENOUS
  Administered 2020-05-04: 10 mg via INTRAVENOUS
  Administered 2020-05-04: 30 mg via INTRAVENOUS
  Administered 2020-05-04 (×2): 10 mg via INTRAVENOUS
  Administered 2020-05-04: 50 mg via INTRAVENOUS
  Administered 2020-05-04: 20 mg via INTRAVENOUS
  Administered 2020-05-04: 30 mg via INTRAVENOUS
  Administered 2020-05-04 (×2): 20 mg via INTRAVENOUS

## 2020-05-04 MED ORDER — ASPIRIN EC 325 MG PO TBEC
325.0000 mg | DELAYED_RELEASE_TABLET | Freq: Every day | ORAL | Status: DC
  Administered 2020-05-05: 325 mg via ORAL
  Filled 2020-05-04: qty 1

## 2020-05-04 MED ORDER — CHLORHEXIDINE GLUCONATE CLOTH 2 % EX PADS
6.0000 | MEDICATED_PAD | Freq: Every day | CUTANEOUS | Status: DC
  Administered 2020-05-04 – 2020-05-09 (×6): 6 via TOPICAL

## 2020-05-04 MED ORDER — FENTANYL CITRATE (PF) 250 MCG/5ML IJ SOLN
INTRAMUSCULAR | Status: DC | PRN
  Administered 2020-05-04: 150 ug via INTRAVENOUS
  Administered 2020-05-04 (×2): 100 ug via INTRAVENOUS
  Administered 2020-05-04: 300 ug via INTRAVENOUS
  Administered 2020-05-04 (×5): 50 ug via INTRAVENOUS
  Administered 2020-05-04 (×2): 100 ug via INTRAVENOUS

## 2020-05-04 MED ORDER — LACTATED RINGERS IV SOLN
INTRAVENOUS | Status: DC
  Administered 2020-05-05: 1000 mL via INTRAVENOUS

## 2020-05-04 MED ORDER — ROCURONIUM BROMIDE 10 MG/ML (PF) SYRINGE
PREFILLED_SYRINGE | INTRAVENOUS | Status: AC
  Filled 2020-05-04: qty 10

## 2020-05-04 MED ORDER — PAROXETINE HCL 20 MG PO TABS
20.0000 mg | ORAL_TABLET | Freq: Every morning | ORAL | Status: DC
  Administered 2020-05-05 – 2020-05-09 (×5): 20 mg via ORAL
  Filled 2020-05-04 (×5): qty 1

## 2020-05-04 MED ORDER — ACETAMINOPHEN 500 MG PO TABS
1000.0000 mg | ORAL_TABLET | Freq: Four times a day (QID) | ORAL | Status: DC
  Administered 2020-05-04 – 2020-05-09 (×18): 1000 mg via ORAL
  Filled 2020-05-04 (×17): qty 2

## 2020-05-04 MED ORDER — POTASSIUM CHLORIDE 10 MEQ/50ML IV SOLN
10.0000 meq | INTRAVENOUS | Status: AC
Start: 2020-05-04 — End: 2020-05-04

## 2020-05-04 MED ORDER — LACTATED RINGERS IV SOLN: INTRAVENOUS | Status: DC | PRN

## 2020-05-04 MED ORDER — PHENYLEPHRINE 40 MCG/ML (10ML) SYRINGE FOR IV PUSH (FOR BLOOD PRESSURE SUPPORT)
PREFILLED_SYRINGE | INTRAVENOUS | Status: AC
  Filled 2020-05-04: qty 10

## 2020-05-04 MED ORDER — SODIUM CHLORIDE 0.45 % IV SOLN: INTRAVENOUS | Status: DC | PRN

## 2020-05-04 MED ORDER — CALCIUM CHLORIDE 10 % IV SOLN
INTRAVENOUS | Status: DC | PRN
  Administered 2020-05-04 (×2): 200 mg via INTRAVENOUS
  Administered 2020-05-04: 100 mg via INTRAVENOUS

## 2020-05-04 MED ORDER — EPINEPHRINE HCL 5 MG/250ML IV SOLN IN NS: 0.0000 ug/min | INTRAVENOUS | Status: DC

## 2020-05-04 MED ORDER — SODIUM CHLORIDE (PF) 0.9 % IJ SOLN
INTRAMUSCULAR | Status: AC
  Filled 2020-05-04: qty 10

## 2020-05-04 MED ORDER — POTASSIUM CHLORIDE CRYS ER 20 MEQ PO TBCR
20.0000 meq | EXTENDED_RELEASE_TABLET | ORAL | Status: AC
  Administered 2020-05-05 (×3): 20 meq via ORAL
  Filled 2020-05-04 (×3): qty 1

## 2020-05-04 MED ORDER — PROPOFOL 10 MG/ML IV BOLUS
INTRAVENOUS | Status: AC
  Filled 2020-05-04: qty 20

## 2020-05-04 MED ORDER — PHENYLEPHRINE HCL-NACL 20-0.9 MG/250ML-% IV SOLN: 0.0000 ug/min | INTRAVENOUS | Status: DC

## 2020-05-04 MED ORDER — MIDAZOLAM HCL (PF) 10 MG/2ML IJ SOLN
INTRAMUSCULAR | Status: AC
  Filled 2020-05-04: qty 2

## 2020-05-04 MED ORDER — ROCURONIUM BROMIDE 10 MG/ML (PF) SYRINGE: PREFILLED_SYRINGE | INTRAVENOUS | Status: DC | PRN

## 2020-05-04 MED ORDER — MAGNESIUM SULFATE 4 GM/100ML IV SOLN
4.0000 g | Freq: Once | INTRAVENOUS | Status: AC
  Administered 2020-05-04: 4 g via INTRAVENOUS
  Filled 2020-05-04: qty 100

## 2020-05-04 MED ORDER — ACETAMINOPHEN 160 MG/5ML PO SOLN: 1000.0000 mg | Freq: Four times a day (QID) | ORAL | Status: DC

## 2020-05-04 MED ORDER — FENTANYL CITRATE (PF) 250 MCG/5ML IJ SOLN
INTRAMUSCULAR | Status: AC
  Filled 2020-05-04: qty 25

## 2020-05-04 MED ORDER — DEXMEDETOMIDINE HCL IN NACL 400 MCG/100ML IV SOLN: 0.0000 ug/kg/h | INTRAVENOUS | Status: DC

## 2020-05-04 MED ORDER — VANCOMYCIN HCL IN DEXTROSE 1-5 GM/200ML-% IV SOLN
1000.0000 mg | Freq: Once | INTRAVENOUS | Status: AC
  Administered 2020-05-04: 1000 mg via INTRAVENOUS
  Filled 2020-05-04: qty 200

## 2020-05-04 MED ORDER — PLASMA-LYTE 148 IV SOLN
INTRAVENOUS | Status: DC | PRN
  Administered 2020-05-04: 500 mL

## 2020-05-04 MED ORDER — SODIUM BICARBONATE 8.4 % IV SOLN
50.0000 meq | Freq: Once | INTRAVENOUS | Status: AC
  Administered 2020-05-04: 50 meq via INTRAVENOUS

## 2020-05-04 MED ORDER — DOCUSATE SODIUM 100 MG PO CAPS
200.0000 mg | ORAL_CAPSULE | Freq: Every day | ORAL | Status: DC
  Administered 2020-05-05 – 2020-05-09 (×4): 200 mg via ORAL
  Filled 2020-05-04 (×5): qty 2

## 2020-05-04 MED ORDER — EPHEDRINE 5 MG/ML INJ
INTRAVENOUS | Status: AC
  Filled 2020-05-04: qty 10

## 2020-05-04 MED ORDER — HEPARIN SODIUM (PORCINE) 1000 UNIT/ML IJ SOLN
INTRAMUSCULAR | Status: DC | PRN
  Administered 2020-05-04: 25000 [IU] via INTRAVENOUS

## 2020-05-04 MED ORDER — METOPROLOL TARTRATE 5 MG/5ML IV SOLN: 2.5000 mg | INTRAVENOUS | Status: DC | PRN

## 2020-05-04 MED ORDER — CHLORHEXIDINE GLUCONATE 4 % EX LIQD: 30.0000 mL | CUTANEOUS | Status: DC

## 2020-05-04 MED ORDER — ONDANSETRON HCL 4 MG/2ML IJ SOLN
4.0000 mg | Freq: Four times a day (QID) | INTRAMUSCULAR | Status: DC | PRN
  Administered 2020-05-05: 4 mg via INTRAVENOUS
  Filled 2020-05-04: qty 2

## 2020-05-04 MED ORDER — ARTIFICIAL TEARS OPHTHALMIC OINT
TOPICAL_OINTMENT | OPHTHALMIC | Status: AC
  Filled 2020-05-04: qty 3.5

## 2020-05-04 MED ORDER — MIDAZOLAM HCL 5 MG/5ML IJ SOLN
INTRAMUSCULAR | Status: DC | PRN
  Administered 2020-05-04 (×4): 1 mg via INTRAVENOUS
  Administered 2020-05-04: 4 mg via INTRAVENOUS

## 2020-05-04 MED ORDER — BISACODYL 10 MG RE SUPP: 10.0000 mg | Freq: Every day | RECTAL | Status: DC

## 2020-05-04 MED ORDER — SODIUM CHLORIDE 0.9 % IV SOLN: INTRAVENOUS | Status: DC | PRN

## 2020-05-04 MED ORDER — PANTOPRAZOLE SODIUM 40 MG PO TBEC
40.0000 mg | DELAYED_RELEASE_TABLET | Freq: Every day | ORAL | Status: DC
  Administered 2020-05-06 – 2020-05-08 (×3): 40 mg via ORAL
  Filled 2020-05-04 (×4): qty 1

## 2020-05-04 MED ORDER — LACTATED RINGERS IV SOLN: INTRAVENOUS | Status: DC

## 2020-05-04 MED ORDER — ALBUMIN HUMAN 5 % IV SOLN: INTRAVENOUS | Status: DC | PRN

## 2020-05-04 MED ORDER — PHENYLEPHRINE HCL (PRESSORS) 10 MG/ML IV SOLN
INTRAVENOUS | Status: DC | PRN
  Administered 2020-05-04: 40 ug via INTRAVENOUS
  Administered 2020-05-04: 80 ug via INTRAVENOUS
  Administered 2020-05-04 (×3): 40 ug via INTRAVENOUS

## 2020-05-04 MED ORDER — SODIUM BICARBONATE 8.4 % IV SOLN
INTRAVENOUS | Status: AC
  Filled 2020-05-04: qty 50

## 2020-05-04 MED ORDER — ALBUMIN HUMAN 5 % IV SOLN
250.0000 mL | INTRAVENOUS | Status: DC | PRN
  Administered 2020-05-04 (×2): 12.5 g via INTRAVENOUS
  Filled 2020-05-04: qty 250

## 2020-05-04 MED ORDER — VANCOMYCIN HCL 1000 MG IV SOLR
INTRAVENOUS | Status: DC | PRN
  Administered 2020-05-04: 1000 mL

## 2020-05-04 MED ORDER — MILRINONE LACTATE IN DEXTROSE 20-5 MG/100ML-% IV SOLN
0.0000 ug/kg/min | INTRAVENOUS | Status: DC
  Administered 2020-05-04: 0.5 ug/kg/min via INTRAVENOUS
  Administered 2020-05-05 – 2020-05-06 (×2): 0.3 ug/kg/min via INTRAVENOUS
  Filled 2020-05-04 (×3): qty 100

## 2020-05-04 MED ORDER — DEXTROSE 50 % IV SOLN
0.0000 mL | INTRAVENOUS | Status: DC | PRN
Start: 2020-05-04 — End: 2020-05-09

## 2020-05-04 MED ORDER — ACETAMINOPHEN 650 MG RE SUPP
650.0000 mg | Freq: Once | RECTAL | Status: AC
  Administered 2020-05-04: 650 mg via RECTAL

## 2020-05-04 MED ORDER — SODIUM CHLORIDE 0.9% FLUSH
3.0000 mL | Freq: Two times a day (BID) | INTRAVENOUS | Status: DC
  Administered 2020-05-05 – 2020-05-06 (×3): 3 mL via INTRAVENOUS

## 2020-05-04 MED ORDER — METOPROLOL TARTRATE 12.5 MG HALF TABLET
12.5000 mg | ORAL_TABLET | Freq: Once | ORAL | Status: AC
  Administered 2020-05-04: 12.5 mg via ORAL
  Filled 2020-05-04: qty 1

## 2020-05-04 MED ORDER — ROCURONIUM BROMIDE 100 MG/10ML IV SOLN: INTRAVENOUS | Status: DC | PRN

## 2020-05-04 MED ORDER — MIDAZOLAM HCL 2 MG/2ML IJ SOLN: 2.0000 mg | INTRAMUSCULAR | Status: DC | PRN

## 2020-05-04 MED ORDER — BISACODYL 5 MG PO TBEC
10.0000 mg | DELAYED_RELEASE_TABLET | Freq: Every day | ORAL | Status: DC
  Administered 2020-05-05 – 2020-05-09 (×4): 10 mg via ORAL
  Filled 2020-05-04 (×5): qty 2

## 2020-05-04 MED ORDER — ACETAMINOPHEN 160 MG/5ML PO SOLN: 650.0000 mg | Freq: Once | ORAL | Status: AC

## 2020-05-04 MED ORDER — PROTAMINE SULFATE 10 MG/ML IV SOLN
INTRAVENOUS | Status: AC
  Filled 2020-05-04: qty 25

## 2020-05-04 MED ORDER — SODIUM CHLORIDE 0.9% FLUSH
10.0000 mL | Freq: Two times a day (BID) | INTRAVENOUS | Status: DC
  Administered 2020-05-05 – 2020-05-06 (×3): 10 mL

## 2020-05-04 MED ORDER — LACTATED RINGERS IV SOLN: 500.0000 mL | Freq: Once | INTRAVENOUS | Status: DC | PRN

## 2020-05-04 MED ORDER — SODIUM CHLORIDE 0.9% FLUSH: 3.0000 mL | INTRAVENOUS | Status: DC | PRN

## 2020-05-04 MED ORDER — FENTANYL CITRATE (PF) 100 MCG/2ML IJ SOLN
25.0000 ug | INTRAMUSCULAR | Status: DC | PRN
  Administered 2020-05-04 – 2020-05-05 (×3): 25 ug via INTRAVENOUS
  Filled 2020-05-04 (×3): qty 2

## 2020-05-04 MED ORDER — INSULIN REGULAR(HUMAN) IN NACL 100-0.9 UT/100ML-% IV SOLN: INTRAVENOUS | Status: DC

## 2020-05-04 MED ORDER — CHLORHEXIDINE GLUCONATE 0.12 % MT SOLN
15.0000 mL | Freq: Once | OROMUCOSAL | Status: AC
  Administered 2020-05-04: 15 mL via OROMUCOSAL
  Filled 2020-05-04: qty 15

## 2020-05-04 MED ORDER — SODIUM CHLORIDE 0.9% FLUSH: 10.0000 mL | INTRAVENOUS | Status: DC | PRN

## 2020-05-04 MED ORDER — ARTIFICIAL TEARS OPHTHALMIC OINT
TOPICAL_OINTMENT | OPHTHALMIC | Status: DC | PRN
  Administered 2020-05-04: 1 via OPHTHALMIC

## 2020-05-04 MED ORDER — SODIUM CHLORIDE 0.9 % IV SOLN: 250.0000 mL | INTRAVENOUS | Status: DC

## 2020-05-04 MED ORDER — FAMOTIDINE IN NACL 20-0.9 MG/50ML-% IV SOLN
20.0000 mg | Freq: Every day | INTRAVENOUS | Status: AC
  Administered 2020-05-04: 20 mg via INTRAVENOUS
  Filled 2020-05-04: qty 50

## 2020-05-04 MED ORDER — CALCIUM CHLORIDE 10 % IV SOLN
INTRAVENOUS | Status: AC
  Filled 2020-05-04: qty 10

## 2020-05-04 MED ORDER — ROCURONIUM BROMIDE 10 MG/ML (PF) SYRINGE
PREFILLED_SYRINGE | INTRAVENOUS | Status: DC | PRN
  Administered 2020-05-04: 100 mg via INTRAVENOUS
  Administered 2020-05-04 (×2): 20 mg via INTRAVENOUS

## 2020-05-04 MED ORDER — SODIUM CHLORIDE (PF) 0.9 % IJ SOLN
OROMUCOSAL | Status: DC | PRN
  Administered 2020-05-04: 12 mL via TOPICAL

## 2020-05-04 MED ORDER — PROPOFOL 10 MG/ML IV BOLUS
INTRAVENOUS | Status: DC | PRN
  Administered 2020-05-04: 40 mg via INTRAVENOUS

## 2020-05-04 MED ORDER — ASPIRIN 81 MG PO CHEW: 324.0000 mg | CHEWABLE_TABLET | Freq: Every day | ORAL | Status: DC

## 2020-05-04 MED ORDER — NITROGLYCERIN IN D5W 200-5 MCG/ML-% IV SOLN: 0.0000 ug/min | INTRAVENOUS | Status: DC

## 2020-05-04 MED ORDER — HEPARIN SODIUM (PORCINE) 1000 UNIT/ML IJ SOLN
INTRAMUSCULAR | Status: AC
  Filled 2020-05-04: qty 1

## 2020-05-04 SURGICAL SUPPLY — 125 items
ADAPTER CARDIO PERF ANTE/RETRO (ADAPTER) ×3 IMPLANT
BAG DECANTER FOR FLEXI CONT (MISCELLANEOUS) ×6 IMPLANT
BLADE CLIPPER SURG (BLADE) ×3 IMPLANT
BLADE STERNUM SYSTEM 6 (BLADE) ×3 IMPLANT
BLADE SURG 11 STRL SS (BLADE) ×6 IMPLANT
BNDG ELASTIC 4X5.8 VLCR STR LF (GAUZE/BANDAGES/DRESSINGS) ×3 IMPLANT
BNDG ELASTIC 6X5.8 VLCR STR LF (GAUZE/BANDAGES/DRESSINGS) ×3 IMPLANT
BNDG GAUZE ELAST 4 BULKY (GAUZE/BANDAGES/DRESSINGS) ×3 IMPLANT
CANISTER SUCT 3000ML PPV (MISCELLANEOUS) ×3 IMPLANT
CANNULA EZ GLIDE AORTIC 21FR (CANNULA) ×3 IMPLANT
CANNULA GUNDRY RCSP 15FR (MISCELLANEOUS) ×3 IMPLANT
CATH CPB KIT OWEN (MISCELLANEOUS) ×3 IMPLANT
CATH HEART VENT LEFT (CATHETERS) ×2 IMPLANT
CATH THORACIC 36FR (CATHETERS) ×3 IMPLANT
CATH THORACIC 36FR RT ANG (CATHETERS) ×3 IMPLANT
CLIP RETRACTION 3.0MM CORONARY (MISCELLANEOUS) ×3 IMPLANT
CLIP VESOCCLUDE MED 24/CT (CLIP) IMPLANT
CLIP VESOCCLUDE SM WIDE 24/CT (CLIP) ×9 IMPLANT
CNTNR URN SCR LID CUP LEK RST (MISCELLANEOUS) ×2 IMPLANT
CONN ST 1/4X3/8  BEN (MISCELLANEOUS) ×3
CONN ST 1/4X3/8 BEN (MISCELLANEOUS) ×2 IMPLANT
CONT SPEC 4OZ STRL OR WHT (MISCELLANEOUS) ×3
COVER MAYO STAND STRL (DRAPES) ×3 IMPLANT
COVER SURGICAL LIGHT HANDLE (MISCELLANEOUS) ×3 IMPLANT
DEFOGGER ANTIFOG KIT (MISCELLANEOUS) ×3 IMPLANT
DEVICE SUT CK QUICK LOAD MINI (Prosthesis & Implant Heart) ×3 IMPLANT
DRAIN CHANNEL 32F RND 10.7 FF (WOUND CARE) ×6 IMPLANT
DRAPE CARDIOVASCULAR INCISE (DRAPES) ×3
DRAPE CV SPLIT W-CLR ANES SCRN (DRAPES) IMPLANT
DRAPE PERI GROIN 82X75IN TIB (DRAPES) IMPLANT
DRAPE SLUSH/WARMER DISC (DRAPES) ×3 IMPLANT
DRAPE SRG 135X102X78XABS (DRAPES) ×2 IMPLANT
DRSG AQUACEL AG ADV 3.5X14 (GAUZE/BANDAGES/DRESSINGS) ×3 IMPLANT
ELECT BLADE 4.0 EZ CLEAN MEGAD (MISCELLANEOUS) ×3
ELECT REM PT RETURN 9FT ADLT (ELECTROSURGICAL) ×6
ELECTRODE BLDE 4.0 EZ CLN MEGD (MISCELLANEOUS) ×2 IMPLANT
ELECTRODE REM PT RTRN 9FT ADLT (ELECTROSURGICAL) ×4 IMPLANT
FELT TEFLON 1X6 (MISCELLANEOUS) ×6 IMPLANT
FIBERTAPE STERNAL CLSR 2 36IN (SUTURE) ×12 IMPLANT
FIBERTAPE STERNAL CLSR 2X36 (SUTURE) ×9 IMPLANT
GAUZE SPONGE 4X4 12PLY STRL (GAUZE/BANDAGES/DRESSINGS) ×6 IMPLANT
GAUZE SPONGE 4X4 12PLY STRL LF (GAUZE/BANDAGES/DRESSINGS) ×6 IMPLANT
GLOVE BIO SURGEON STRL SZ 6 (GLOVE) IMPLANT
GLOVE BIO SURGEON STRL SZ 6.5 (GLOVE) IMPLANT
GLOVE BIO SURGEON STRL SZ7 (GLOVE) IMPLANT
GLOVE BIO SURGEON STRL SZ7.5 (GLOVE) IMPLANT
GLOVE ORTHO TXT STRL SZ7.5 (GLOVE) ×9 IMPLANT
GLOVE SURG MICRO LTX SZ6 (GLOVE) ×12 IMPLANT
GLOVE SURG POLYISO LF SZ6 (GLOVE) ×3 IMPLANT
GOWN STRL REUS W/ TWL LRG LVL3 (GOWN DISPOSABLE) ×8 IMPLANT
GOWN STRL REUS W/TWL LRG LVL3 (GOWN DISPOSABLE) ×12
HEMOSTAT POWDER SURGIFOAM 1G (HEMOSTASIS) ×9 IMPLANT
INSERT FOGARTY XLG (MISCELLANEOUS) ×3 IMPLANT
KIT BASIN OR (CUSTOM PROCEDURE TRAY) ×3 IMPLANT
KIT SUCTION CATH 14FR (SUCTIONS) ×9 IMPLANT
KIT SUT CK MINI COMBO 4X17 (Prosthesis & Implant Heart) ×3 IMPLANT
KIT TURNOVER KIT B (KITS) ×3 IMPLANT
KIT VASOVIEW HEMOPRO 2 VH 4000 (KITS) ×3 IMPLANT
LEAD PACING MYOCARDI (MISCELLANEOUS) ×3 IMPLANT
LINE VENT (MISCELLANEOUS) ×3 IMPLANT
MARKER GRAFT CORONARY BYPASS (MISCELLANEOUS) ×9 IMPLANT
NDL SUT PASSING CERCLAGE MED (SUTURE) ×6
NEEDLE SUT PASSING CERCLAG MED (SUTURE) ×4 IMPLANT
NS IRRIG 1000ML POUR BTL (IV SOLUTION) ×15 IMPLANT
PACK E OPEN HEART (SUTURE) ×3 IMPLANT
PACK OPEN HEART (CUSTOM PROCEDURE TRAY) ×3 IMPLANT
PAD ARMBOARD 7.5X6 YLW CONV (MISCELLANEOUS) ×6 IMPLANT
PAD ELECT DEFIB RADIOL ZOLL (MISCELLANEOUS) ×3 IMPLANT
PENCIL BUTTON HOLSTER BLD 10FT (ELECTRODE) ×3 IMPLANT
POSITIONER HEAD DONUT 9IN (MISCELLANEOUS) ×3 IMPLANT
PUNCH AORTIC ROTATE 4.0MM (MISCELLANEOUS) ×3 IMPLANT
SET CARDIOPLEGIA MPS 5001102 (MISCELLANEOUS) ×3 IMPLANT
SET IRRIG TUBING LAPAROSCOPIC (IRRIGATION / IRRIGATOR) IMPLANT
SOL ANTI FOG 6CC (MISCELLANEOUS) IMPLANT
SOLUTION ANTI FOG 6CC (MISCELLANEOUS)
SPONGE LAP 18X18 RF (DISPOSABLE) ×3 IMPLANT
SPONGE LAP 4X18 RFD (DISPOSABLE) ×3 IMPLANT
SUPPORT HEART JANKE-BARRON (MISCELLANEOUS) ×3 IMPLANT
SUT BONE WAX W31G (SUTURE) ×3 IMPLANT
SUT EB EXC GRN/WHT 2-0 V-5 (SUTURE) ×6 IMPLANT
SUT ETHIBON EXCEL 2-0 V-5 (SUTURE) IMPLANT
SUT ETHIBOND 2 0 SH (SUTURE)
SUT ETHIBOND 2 0 SH 36X2 (SUTURE) IMPLANT
SUT ETHIBOND 2 0 V4 (SUTURE) IMPLANT
SUT ETHIBOND 2 0V4 GREEN (SUTURE) IMPLANT
SUT ETHIBOND 4 0 RB 1 (SUTURE) IMPLANT
SUT ETHIBOND V-5 VALVE (SUTURE) IMPLANT
SUT ETHIBOND X763 2 0 SH 1 (SUTURE) ×6 IMPLANT
SUT MNCRL AB 3-0 PS2 18 (SUTURE) ×6 IMPLANT
SUT MNCRL AB 4-0 PS2 18 (SUTURE) ×3 IMPLANT
SUT PDS AB 1 CTX 36 (SUTURE) ×6 IMPLANT
SUT PROLENE 2 0 SH DA (SUTURE) IMPLANT
SUT PROLENE 3 0 SH DA (SUTURE) ×9 IMPLANT
SUT PROLENE 3 0 SH1 36 (SUTURE) ×36 IMPLANT
SUT PROLENE 4 0 RB 1 (SUTURE) ×12
SUT PROLENE 4 0 SH DA (SUTURE) ×12 IMPLANT
SUT PROLENE 4-0 RB1 .5 CRCL 36 (SUTURE) ×8 IMPLANT
SUT PROLENE 5 0 C 1 36 (SUTURE) IMPLANT
SUT PROLENE 6 0 C 1 30 (SUTURE) ×12 IMPLANT
SUT PROLENE 7.0 RB 3 (SUTURE) ×9 IMPLANT
SUT PROLENE 8 0 BV175 6 (SUTURE) IMPLANT
SUT PROLENE BLUE 7 0 (SUTURE) ×3 IMPLANT
SUT SILK  1 MH (SUTURE) ×3
SUT SILK 1 MH (SUTURE) ×2 IMPLANT
SUT SILK 2 0 SH CR/8 (SUTURE) IMPLANT
SUT SILK 3 0 SH CR/8 (SUTURE) IMPLANT
SUT VIC AB 1 CTX 36 (SUTURE)
SUT VIC AB 1 CTX36XBRD ANBCTR (SUTURE) IMPLANT
SUT VIC AB 2-0 CT1 27 (SUTURE) ×3
SUT VIC AB 2-0 CT1 TAPERPNT 27 (SUTURE) ×2 IMPLANT
SUT VIC AB 3-0 SH 27 (SUTURE)
SUT VIC AB 3-0 SH 27X BRD (SUTURE) IMPLANT
SUT VIC AB 3-0 X1 27 (SUTURE) IMPLANT
SUT VICRYL 4-0 PS2 18IN ABS (SUTURE) IMPLANT
SYSTEM SAHARA CHEST DRAIN ATS (WOUND CARE) ×3 IMPLANT
TAPE CLOTH SURG 4X10 WHT LF (GAUZE/BANDAGES/DRESSINGS) ×3 IMPLANT
TAPE PAPER 2X10 WHT MICROPORE (GAUZE/BANDAGES/DRESSINGS) ×3 IMPLANT
TOWEL GREEN STERILE (TOWEL DISPOSABLE) ×3 IMPLANT
TOWEL GREEN STERILE FF (TOWEL DISPOSABLE) ×3 IMPLANT
TRAY FOLEY SLVR 16FR TEMP STAT (SET/KITS/TRAYS/PACK) ×3 IMPLANT
TUBING LAP HI FLOW INSUFFLATIO (TUBING) ×3 IMPLANT
UNDERPAD 30X36 HEAVY ABSORB (UNDERPADS AND DIAPERS) ×3 IMPLANT
VALVE AORTIC SZ23 INSP/RESIL (Prosthesis & Implant Heart) ×3 IMPLANT
VENT LEFT HEART 12002 (CATHETERS) ×3
WATER STERILE IRR 1000ML POUR (IV SOLUTION) ×6 IMPLANT

## 2020-05-04 NOTE — Anesthesia Procedure Notes (Signed)
Central Venous Catheter Insertion Performed by: Nolon Nations, MD, anesthesiologist Start/End4/13/2022 7:40 AM, 05/04/2020 8:00 AM Patient location: Pre-op. Preanesthetic checklist: patient identified, IV checked, site marked, risks and benefits discussed, surgical consent, monitors and equipment checked, pre-op evaluation, timeout performed and anesthesia consent Position: Trendelenburg Lidocaine 1% used for infiltration and patient sedated Hand hygiene performed  and maximum sterile barriers used  Catheter size: 9 Fr MAC introducer Swan type:thermodilution PA Cath depth:45 Procedure performed using ultrasound guided technique. Ultrasound Notes:anatomy identified, needle tip was noted to be adjacent to the nerve/plexus identified, no ultrasound evidence of intravascular and/or intraneural injection and image(s) printed for medical record Attempts: 1 Following insertion, line sutured, dressing applied and Biopatch. Post procedure assessment: blood return through all ports, free fluid flow and no air  Patient tolerated the procedure well with no immediate complications.

## 2020-05-04 NOTE — Interval H&P Note (Signed)
History and Physical Interval Note:  05/04/2020 7:55 AM  [name]  has presented today for surgery, with the diagnosis of AS CAD.  The various methods of treatment have been discussed with the patient and family. After consideration of risks, benefits and other options for treatment, the patient has consented to  Procedure(s): AORTIC VALVE REPLACEMENT (AVR) (N/A) CORONARY ARTERY BYPASS GRAFTING (CABG) (N/A) TRANSESOPHAGEAL ECHOCARDIOGRAM (TEE) (N/A) as a surgical intervention.  The patient's history has been reviewed, patient examined, no change in status, stable for surgery.  I have reviewed the patient's chart and labs.  Questions were answered to the patient's satisfaction.     Rexene Alberts

## 2020-05-04 NOTE — Procedures (Signed)
Extubation Procedure Note  Patient Details:   Name: [name] DOB: 1940-02-14 MRN: [id number]   Airway Documentation:    Vent end date: 05/04/20 Vent end time: 2110   Evaluation  O2 sats: stable throughout Complications: No apparent complications Patient did tolerate procedure well. Bilateral Breath Sounds: Clear,Diminished   Yes  Graciella Freer 05/04/2020, 9:19 PM   Pt extubated per Dr. Roxy Manns. Pt able to perform NIF of -26 and VC of 7108ml. Pt had positive cuff leak and was placed on 4L [place] at this time.

## 2020-05-04 NOTE — Anesthesia Procedure Notes (Signed)
Procedure Name: Intubation Date/Time: 05/04/2020 8:58 AM Performed by: Janene Harvey, CRNA Pre-anesthesia Checklist: Patient identified, Emergency Drugs available, Suction available and Patient being monitored Patient Re-evaluated:Patient Re-evaluated prior to induction Oxygen Delivery Method: Circle system utilized Preoxygenation: Pre-oxygenation with 100% oxygen Induction Type: IV induction Ventilation: Mask ventilation without difficulty and Oral airway inserted - appropriate to patient size Laryngoscope Size: Mac and 4 Grade View: Grade I Tube type: Oral Tube size: 8.0 mm Number of attempts: 1 Airway Equipment and Method: Stylet and Oral airway Placement Confirmation: ETT inserted through vocal cords under direct vision,  positive ETCO2 and breath sounds checked- equal and bilateral Secured at: 22 cm Tube secured with: Tape Dental Injury: Teeth and Oropharynx as per pre-operative assessment

## 2020-05-04 NOTE — Progress Notes (Addendum)
Pt. PH 7.29, pCO2: 45, pO2: 70, HCO3-: 21.8, TCO2: 92 Be:-5  Paged Dr. Ricard Dillon, gave verbal to increase RR to 18 and do recruitment.  Consuella Lose, RN

## 2020-05-04 NOTE — Plan of Care (Signed)

## 2020-05-04 NOTE — Progress Notes (Signed)
Pt failed rapid wean protocol at this time per the ABG and the lack of ability to do respiratory mechanics. Dr. Roxy Manns at bedside to witness. Pt was placed back on previous vent settings and RT will attempt at a later time.

## 2020-05-04 NOTE — Transfer of Care (Signed)
Immediate Anesthesia Transfer of Care Note  Patient: [name]  Procedure(s) Performed: AORTIC VALVE REPLACEMENT (AVR) USING INSPIRIS 23MM RESILIA AORTIC VALVE (N/A Chest) CORONARY ARTERY BYPASS GRAFTING (CABG), ON PUMP, TIMES TWO, USING LEFT INTERNAL MAMMARY ARTERY AND RIGHT ENDOSCOPICALLY HARVESTED GREATER SAPHENOUS VEIN (N/A Chest) TRANSESOPHAGEAL ECHOCARDIOGRAM (TEE) (N/A )  Patient Location: ICU  Anesthesia Type:General  Level of Consciousness: Patient remains intubated per anesthesia plan  Airway & Oxygen Therapy: Patient placed on Ventilator (see vital sign flow sheet for setting)  Post-op Assessment: Report given to RN and Post -op Vital signs reviewed and stable  Post vital signs: Reviewed  Last Vitals:  Vitals Value Taken Time  BP 106/66 05/04/20 1527  Temp 36.4 C 05/04/20 1529  Pulse 94 05/04/20 1529  Resp 17 05/04/20 1529  SpO2 94 % 05/04/20 1529  Vitals shown include unvalidated device data.  Last Pain:  Vitals:   05/04/20 0628  TempSrc: Oral         Complications: No complications documented.

## 2020-05-04 NOTE — Progress Notes (Signed)
[facility] BRIEF SICU PROGRESS NOTE  Day of Surgery  S/P Procedure(s) (LRB): AORTIC VALVE REPLACEMENT (AVR) USING INSPIRIS 23MM RESILIA AORTIC VALVE (N/A) CORONARY ARTERY BYPASS GRAFTING (CABG), ON PUMP, TIMES TWO, USING LEFT INTERNAL MAMMARY ARTERY AND RIGHT ENDOSCOPICALLY HARVESTED GREATER SAPHENOUS VEIN (N/A) TRANSESOPHAGEAL ECHOCARDIOGRAM (TEE) (N/A)   Starting to wake up on vent but not ready for extubation NSR w/ stable hemodynamics on milrinone 0.5 Epi 1 O2 sats 98% on 40% FiO2 Chest tube output low UOP adequate Labs okay  Plan: Continue routine early postop  Rexene Alberts, MD 05/04/2020 6:48 PM

## 2020-05-04 NOTE — Brief Op Note (Signed)
05/04/2020  2:43 PM  PATIENT:  [name]  80 y.o. female  PRE-OPERATIVE DIAGNOSIS:  Aortic Stenosis Coronary Artery Disease  POST-OPERATIVE DIAGNOSIS:  Aortic Stenosis Coronary Artery Disease  PROCEDURE:  Procedure(s): AORTIC VALVE REPLACEMENT (AVR) USING INSPIRIS 23MM RESILIA AORTIC VALVE (N/A) CORONARY ARTERY BYPASS GRAFTING (CABG), ON PUMP, TIMES TWO, USING LEFT INTERNAL MAMMARY ARTERY AND RIGHT ENDOSCOPICALLY HARVESTED GREATER SAPHENOUS VEIN (N/A) TRANSESOPHAGEAL ECHOCARDIOGRAM (TEE) (N/A)   LIMA to Diag 3 SVG to PDA  Greater saphenous vein harvest time: 32 minutes, prep time 7 minutes  SURGEON:  Surgeon(s) and Role:    Rexene Alberts, MD - Primary  PHYSICIAN ASSISTANT:  Nicholes Rough, PA-C   ANESTHESIA:   general  EBL:  774 mL   BLOOD ADMINISTERED:2 UNITS CC PRBC  DRAINS: ROUTINE   LOCAL MEDICATIONS USED:  NONE  SPECIMEN:  Source of Specimen:  AORTIC VALVE LEAFLETS  DISPOSITION OF SPECIMEN:  PATHOLOGY  COUNTS:  YES  TOURNIQUET:  * No tourniquets in log *  DICTATION: .Dragon Dictation  PLAN OF CARE: Admit to inpatient   PATIENT DISPOSITION:  ICU - intubated and hemodynamically stable.   Delay start of Pharmacological VTE agent (>24hrs) due to surgical blood loss or risk of bleeding: yes

## 2020-05-04 NOTE — Op Note (Signed)
CARDIOTHORACIC SURGERY OPERATIVE NOTE  Date of Procedure:  05/04/2020  Preoperative Diagnosis:   Severe Aortic Stenosis  Severe Multi-vessel Coronary Artery Disease  Postoperative Diagnosis: Same  Procedure:    Aortic Valve Replacement  Edwards Inspiris Resilia Stented Bovine Pericardial Tissue Valve (size 23 mm, ref # [id number], serial # [id number])   Coronary Artery Bypass Grafting x 2  Left Internal Mammary Artery to Third Diagonal Branch of Distal Left Anterior Descending Coronary Artery  Saphenous Vein Graft to Posterior Descending Coronary Artery  Endoscopic Vein Harvest from Right Thigh   Surgeon: Valentina Gu. Roxy Manns, MD  Assistant: Nicholes Rough, PA-C  Anesthesia: Nolon Nations, MD  Operative Findings:  [place] type I bicuspid aortic valve   Severe aortic stenosis  Normal left ventricular systolic function  Moderate mitral regurgitation  Good quality left internal mammary artery conduit  Good quality saphenous vein conduit  Good quality target vessels for grafting            BRIEF CLINICAL NOTE AND INDICATIONS FOR SURGERY  Patient is a 80 year old female with history of aortic stenosis, coronary artery disease, hypertension, hyperlipidemia, type 2 diabetes mellitus with complications, peripheral arterial disease with chronic claudication, remote history of tobacco abuse, stage IV chronic kidney disease, anxiety and depression who returns to the office for management of aortic stenosis and multivessel coronary artery disease.  Patient's cardiac history dates back to 2007 when she underwent PCI and stenting of the right coronary artery. She reportedly did not have a heart attack at that time. For the last several years she has been followed intermittently by Dr. Johnsie Cancel. Nuclear stress test in 2017 revealed no ischemia. Echocardiograms have documented the presence of normal left ventricular systolic function with aortic stenosis and mitral regurgitation that  has gradually progressed in severity. Over the last 2 to 3 months the patient has developed symptoms of exertional shortness of breath with vague choking sensation in her throat. Symptoms have always been associated with physical exertion and are promptly relieved by rest, although they may occur with relatively low level activity. She has not had any resting shortness of breath or chest discomfort. She denies any history of PND, orthopnea, or lower extremity edema. She has never had any dizzy spells or syncope. She was seen urgently in the office by Dr. Johney Frame and transthoracic echocardiogram performed February 29, 2020 revealed progression in the severity of the patient's aortic valve disease with at least moderate and probably stage D3 severe symptomatic aortic stenosis. Left ventricular systolic function remain normal with ejection fraction estimated 65 to 70%. There was moderate mitral regurgitation which appeared to be secondary (functional) and somewhat worse than previous echocardiograms. The patient was referred to the multidisciplinary heart valve clinic and has been evaluated previously by Dr. Angelena Form. Diagnostic cardiac catheterization was performed March 17, 2020. Catheterization confirmed the presence of aortic stenosis with peak to peak and mean transvalvular gradients measured 34 and 26.9 mmHg, respectively. There was severe multivessel coronary artery disease with long segment severe stenosis of the mid left anterior descending coronary artery and high-grade ostial stenosis of the right coronary artery with continued patency in the distal segment of the right coronary artery. Cardiac output remain normal. There was mild to moderate pulmonary hypertension. Patient was referred for surgical consultation.  The patient has been seen in consultation and counseled at length regarding the indications, risks and potential benefits of surgery.  All questions have been answered, and  the patient provides full informed consent for the operation as described.  DETAILS OF THE OPERATIVE PROCEDURE  Preparation:  The patient is brought to the operating room on the above mentioned date and central monitoring was established by the anesthesia team including placement of Swan-Ganz catheter and radial arterial line. The patient is placed in the supine position on the operating table.  Intravenous antibiotics are administered. General endotracheal anesthesia is induced uneventfully. A Foley catheter is placed.  Baseline transesophageal echocardiogram was performed.  Findings were notable for normal left ventricular systolic function with moderate to severe left ventricular hypertrophy and significant diastolic dysfunction.  There was bicuspid aortic valve with a single raphae.  There was severe aortic stenosis.  There was moderate central mitral regurgitation with functional restriction of the posterior leaflet.  The patient's chest, abdomen, both groins, and both lower extremities are prepared and draped in a sterile manner. A time out procedure is performed.   Surgical Approach and Conduit Harvest:  A median sternotomy incision was performed and the left internal mammary artery is dissected from the chest wall and prepared for bypass grafting. The left internal mammary artery is notably good quality conduit. Simultaneously, saphenous vein is obtained from the patient's right thigh using endoscopic vein harvest technique. The saphenous vein is notably good quality conduit. After removal of the saphenous vein, the small surgical incisions in the lower extremity are closed with absorbable suture. Following systemic heparinization, the left internal mammary artery was transected distally noted to have excellent flow.   Extracorporeal Cardiopulmonary Bypass and Myocardial Protection:  The pericardium is opened. The ascending aorta is normal in size but significantly diseased  proximally with atherosclerotic plaque close to the aortic root. The ascending aorta and the right atrium are cannulated for cardiopulmonary bypass.  Adequate heparinization is verified.    A retrograde cardioplegia cannula is placed through the right atrium into the coronary sinus.  The operative field was continuously flooded with carbon dioxide gas.  The entire pre-bypass portion of the operation was notable for stable hemodynamics.  Cardiopulmonary bypass was begun and a left ventricular vent placed through the right superior pulmonary vein.  The surface of the heart inspected. Distal target vessels are selected for coronary artery bypass grafting. A cardioplegia cannula is placed in the ascending aorta.  A temperature probe was placed in the interventricular septum.  The patient is cooled to 32C systemic temperature.  The aortic cross clamp is applied and cardioplegia is delivered initially in an antegrade fashion through the aortic root using modified [place] cold blood cardioplegia ([place] blood cardioplegia protocol).   The initial cardioplegic arrest is rapid with early diastolic arrest.  Repeat doses of cardioplegia are administered at 90 minutes and every 30 minutes thereafter through the aortic root, the coronary sinus catheter, and through subsequently placed vein grafts in order to maintain completely flat electrocardiogram and septal myocardial temperature below 15C.  Myocardial protection was felt to be adequate excellent.   Coronary Artery Bypass Grafting:   The proximal posterior descending branch of the right coronary artery was grafted using a reversed saphenous vein graft in an end-to-side fashion just beyond the bifurcation of the right coronary artery.  At the site of distal anastomosis the target vessel was good quality and measured approximately 2.0 mm in diameter.  The large third diagonal branch off of the distal left anterior coronary artery was grafted with the left  internal mammary artery in an end-to-side fashion.  This vessel is the dominant branch supplying the anterior wall and wraps the apex of the heart.  At the site of distal anastomosis the target vessel was good quality and measured approximately 2.0 mm in diameter.   Aortic Valve Replacement:  An oblique transverse aortotomy incision was performed.  There is significant calcification and atherosclerotic plaque in the proximal aorta particularly along the sinotubular junction.  The aortic valve was inspected and notable for [place] type I bicuspid aortic valve with a single raphae between the left and right leaflets.  There was severe aortic stenosis.  The aortic valve leaflets were excised sharply and the aortic annulus decalcified.  Decalcification was notably straightforward.  The aortic annulus was sized to accept a 23 mm prosthesis.  The aortic root and left ventricle were irrigated with copious cold saline solution.  Aortic valve replacement was performed using interrupted horizontal mattress 2-0 Ethibond pledgeted sutures with pledgets in the subannular position.  An Edwards Inspiris Resilia stented bovine pericardial tissue valve (size 23 mm, ref # [id number], serial # [id number]) was implanted uneventfully. The valve seated appropriately with adequate space beneath the left main and right coronary artery.  The aortotomy was closed with mercury using a 2-layer closure of running 4-0 Prolene suture with Teflon felt strips to buttress the suture line.  Several additional interrupted horizontal mattress sutures are placed due to significant separation of the layers of the atherosclerotic aorta.   Procedure Completion:  The single proximal vein graft anastomosis was placed directly to the ascending aorta prior to removal of the aortic cross clamp.  The septal myocardial temperature rose rapidly after reperfusion of the left internal mammary artery graft.  One final dose of warm retrograde "reanimation dose"  cardioplegia was administered through the coronary sinus catheter while all air was evacuated through the aortic root.  The aortic cross clamp was removed after a total cross clamp time of 109 minutes.  All proximal and distal coronary anastomoses were inspected for hemostasis and appropriate graft orientation. Epicardial pacing wires are fixed to the inferior right ventricular freewall and to the right atrial appendage. The patient is rewarmed to 37C temperature. The aortic and left ventricular vents were removed.  The patient is weaned and disconnected from cardiopulmonary bypass.  The patient's rhythm at separation from bypass was AV paced.  Initially the patient was weaned from cardiopulmonary bypass without any inotropic support.  However, the patient developed hypotension with elevated PA pressures.  There was evidence of significant diastolic dysfunction with normal left ventricular systolic function and worsening of the patient's baseline mitral regurgitation.  The patient was rested on cardiopulmonary bypass for an additional 18 minutes during which time milrinone infusion was initiated.  The patient was subsequently weaned and separated from cardiopulmonary bypass without difficulty.  The patient was weaned from bypass on milrinone at 0.5 mcg/kg/min.  Total cardiopulmonary bypass time for the operation was 157 minutes.  Followup transesophageal echocardiogram performed after separation from bypass revealed a well-seated bioprosthetic tissue valve in the aortic position that was functioning normally.  There was no paravalvular leak.  Left ventricular systolic function appeared normal.  There were no wall motion abnormalities.  Initially following separation from bypass the patient's mitral regurgitation looked considerably worse, bordering on severe.  However, with time the patient's mitral regurgitation returned to preoperative baseline.  The aortic and venous cannula were removed uneventfully.  Protamine was administered to reverse the anticoagulation. The mediastinum and pleural space were inspected for hemostasis and irrigated with saline solution. The mediastinum and the left pleural space were drained using 3 chest tubes placed through separate stab incisions  inferiorly.  The soft tissues anterior to the aorta were reapproximated loosely. The sternum is closed with double strength sternal wire. The soft tissues anterior to the sternum were closed in multiple layers and the skin is closed with a running subcuticular skin closure.  The post-bypass portion of the operation was notable for stable rhythm and hemodynamics.  The patient received 2 units packed red blood cells during the procedure due to anemia which was present preoperatively and exacerbated by acute blood loss and hemodilution during cardiopulmonary bypass.    Disposition:  The patient tolerated the procedure well and is transported to the surgical intensive care in stable condition. There are no intraoperative complications. All sponge instrument and needle counts are verified correct at completion of the operation.   Valentina Gu. Roxy Manns MD 05/04/2020 2:43 PM

## 2020-05-04 NOTE — Anesthesia Postprocedure Evaluation (Signed)
Anesthesia Post Note  Patient: [name]  Procedure(s) Performed: AORTIC VALVE REPLACEMENT (AVR) USING INSPIRIS 23MM RESILIA AORTIC VALVE (N/A Chest) CORONARY ARTERY BYPASS GRAFTING (CABG), ON PUMP, TIMES TWO, USING LEFT INTERNAL MAMMARY ARTERY AND RIGHT ENDOSCOPICALLY HARVESTED GREATER SAPHENOUS VEIN (N/A Chest) TRANSESOPHAGEAL ECHOCARDIOGRAM (TEE) (N/A )     Patient location during evaluation: SICU Anesthesia Type: General Level of consciousness: sedated and patient remains intubated per anesthesia plan Pain management: pain level controlled Vital Signs Assessment: post-procedure vital signs reviewed and stable Respiratory status: patient remains intubated per anesthesia plan and patient on ventilator - see flowsheet for VS Cardiovascular status: stable Anesthetic complications: no   No complications documented.  Last Vitals:  Vitals:   05/04/20 1745 05/04/20 1800  BP:  118/63  Pulse: 89 86  Resp: 20 20  Temp: 36.7 C 36.6 C  SpO2: 100% 97%    Last Pain:  Vitals:   05/04/20 1600  TempSrc: Core                 Nolon Nations

## 2020-05-04 NOTE — Interval H&P Note (Signed)
History and Physical Interval Note:  05/04/2020 7:54 AM  [name]  has presented today for surgery, with the diagnosis of AS CAD.  The various methods of treatment have been discussed with the patient and family. After consideration of risks, benefits and other options for treatment, the patient has consented to  Procedure(s): AORTIC VALVE REPLACEMENT (AVR) (N/A) CORONARY ARTERY BYPASS GRAFTING (CABG) (N/A) TRANSESOPHAGEAL ECHOCARDIOGRAM (TEE) (N/A) as a surgical intervention.  The patient's history has been reviewed, patient examined, no change in status, stable for surgery.  I have reviewed the patient's chart and labs.  Questions were answered to the patient's satisfaction.     Rexene Alberts

## 2020-05-04 NOTE — Progress Notes (Signed)
1700 pt. Was being paced AAI, quit capturing and intrinsic heart rate dropped to 40's, switched pacer to DDD at a rate of 80.   Consuella Lose, RN

## 2020-05-04 NOTE — Anesthesia Procedure Notes (Signed)
Arterial Line Insertion Start/End4/13/2022 8:00 AM, 05/04/2020 8:10 AM Performed by: Nolon Nations, MD, Janene Harvey, CRNA, anesthesiologist  Patient location: Pre-op. Preanesthetic checklist: patient identified, IV checked, site marked, risks and benefits discussed, surgical consent, monitors and equipment checked, pre-op evaluation, timeout performed and anesthesia consent Lidocaine 1% used for infiltration radial was placed Catheter size: 20 Fr Hand hygiene performed  and maximum sterile barriers used   Attempts: 3 (Attempt by Waggoner x 1, Lee x 1, Laurence Crofford with [place] x 1) Procedure performed using ultrasound guided technique. Ultrasound Notes:anatomy identified, needle tip was noted to be adjacent to the nerve/plexus identified, no ultrasound evidence of intravascular and/or intraneural injection and image(s) printed for medical record Following insertion, dressing applied and Biopatch. Post procedure assessment: normal and unchanged  Post procedure complications: local hematoma. Patient tolerated the procedure with difficulty. Additional procedure comments: Small hematoma from failed attempts.Marland Kitchen

## 2020-05-04 NOTE — Progress Notes (Signed)
RT NOTE: Rate increased to 18 and 2 minute 02 recruitment maneuver was performed per Dr. Ricard Dillon. Pt tolerated recruitment maneuver, maintaining appropriate vitals throughout. Pt is stable at this time. RT will monitor.

## 2020-05-05 ENCOUNTER — Encounter (HOSPITAL_COMMUNITY): Payer: Self-pay | Admitting: Thoracic Surgery (Cardiothoracic Vascular Surgery)

## 2020-05-05 ENCOUNTER — Inpatient Hospital Stay (HOSPITAL_COMMUNITY): Payer: Medicare Other

## 2020-05-05 DIAGNOSIS — Z006 Encounter for examination for normal comparison and control in clinical research program: Secondary | ICD-10-CM | POA: Diagnosis not present

## 2020-05-05 DIAGNOSIS — N184 Chronic kidney disease, stage 4 (severe): Secondary | ICD-10-CM | POA: Diagnosis not present

## 2020-05-05 DIAGNOSIS — I130 Hypertensive heart and chronic kidney disease with heart failure and stage 1 through stage 4 chronic kidney disease, or unspecified chronic kidney disease: Secondary | ICD-10-CM | POA: Diagnosis not present

## 2020-05-05 DIAGNOSIS — I350 Nonrheumatic aortic (valve) stenosis: Secondary | ICD-10-CM | POA: Diagnosis not present

## 2020-05-05 LAB — CBC
HCT: 27.2 % — ABNORMAL LOW (ref 36.0–46.0)
HCT: 28.1 % — ABNORMAL LOW (ref 36.0–46.0)
Hemoglobin: 9.1 g/dL — ABNORMAL LOW (ref 12.0–15.0)
Hemoglobin: 9.4 g/dL — ABNORMAL LOW (ref 12.0–15.0)
MCH: 30.2 pg (ref 26.0–34.0)
MCH: 30.7 pg (ref 26.0–34.0)
MCHC: 33.5 g/dL (ref 30.0–36.0)
MCHC: 33.5 g/dL (ref 30.0–36.0)
MCV: 90.4 fL (ref 80.0–100.0)
MCV: 91.8 fL (ref 80.0–100.0)
Platelets: 99 10*3/uL — ABNORMAL LOW (ref 150–400)
Platelets: 88 10*3/uL — ABNORMAL LOW (ref 150–400)
RBC: 3.01 MIL/uL — ABNORMAL LOW (ref 3.87–5.11)
RBC: 3.06 MIL/uL — ABNORMAL LOW (ref 3.87–5.11)
RDW: 15.5 % (ref 11.5–15.5)
RDW: 15.9 % — ABNORMAL HIGH (ref 11.5–15.5)
WBC: 10.4 10*3/uL (ref 4.0–10.5)
WBC: 10.8 10*3/uL — ABNORMAL HIGH (ref 4.0–10.5)
nRBC: 0 % (ref 0.0–0.2)
nRBC: 0 % (ref 0.0–0.2)

## 2020-05-05 LAB — MAGNESIUM
Magnesium: 2.3 mg/dL (ref 1.7–2.4)
Magnesium: 2.2 mg/dL (ref 1.7–2.4)

## 2020-05-05 LAB — BASIC METABOLIC PANEL
Anion gap: 7 (ref 5–15)
Anion gap: 6 (ref 5–15)
BUN: 23 mg/dL (ref 8–23)
BUN: 27 mg/dL — ABNORMAL HIGH (ref 8–23)
CO2: 23 mmol/L (ref 22–32)
CO2: 22 mmol/L (ref 22–32)
Calcium: 7.5 mg/dL — ABNORMAL LOW (ref 8.9–10.3)
Calcium: 7.8 mg/dL — ABNORMAL LOW (ref 8.9–10.3)
Chloride: 109 mmol/L (ref 98–111)
Chloride: 109 mmol/L (ref 98–111)
Creatinine, Ser: 1.35 mg/dL — ABNORMAL HIGH (ref 0.44–1.00)
Creatinine, Ser: 1.51 mg/dL — ABNORMAL HIGH (ref 0.44–1.00)
GFR, Estimated: 40 mL/min — ABNORMAL LOW (ref >60)
GFR, Estimated: 35 mL/min — ABNORMAL LOW (ref >60)
Glucose, Bld: 154 mg/dL — ABNORMAL HIGH (ref 70–99)
Glucose, Bld: 244 mg/dL — ABNORMAL HIGH (ref 70–99)
Potassium: 3.9 mmol/L (ref 3.5–5.1)
Potassium: 4.2 mmol/L (ref 3.5–5.1)
Sodium: 139 mmol/L (ref 135–145)
Sodium: 137 mmol/L (ref 135–145)

## 2020-05-05 LAB — GLUCOSE, CAPILLARY
Glucose-Capillary: 145 mg/dL — ABNORMAL HIGH (ref 70–99)
Glucose-Capillary: 153 mg/dL — ABNORMAL HIGH (ref 70–99)
Glucose-Capillary: 161 mg/dL — ABNORMAL HIGH (ref 70–99)
Glucose-Capillary: 162 mg/dL — ABNORMAL HIGH (ref 70–99)
Glucose-Capillary: 164 mg/dL — ABNORMAL HIGH (ref 70–99)
Glucose-Capillary: 170 mg/dL — ABNORMAL HIGH (ref 70–99)
Glucose-Capillary: 189 mg/dL — ABNORMAL HIGH (ref 70–99)
Glucose-Capillary: 191 mg/dL — ABNORMAL HIGH (ref 70–99)
Glucose-Capillary: 198 mg/dL — ABNORMAL HIGH (ref 70–99)
Glucose-Capillary: 210 mg/dL — ABNORMAL HIGH (ref 70–99)
Glucose-Capillary: 231 mg/dL — ABNORMAL HIGH (ref 70–99)

## 2020-05-05 MED ORDER — INSULIN ASPART 100 UNIT/ML ~~LOC~~ SOLN
0.0000 [IU] | SUBCUTANEOUS | Status: DC
  Administered 2020-05-05: 2 [IU] via SUBCUTANEOUS

## 2020-05-05 MED ORDER — FUROSEMIDE 10 MG/ML IJ SOLN
40.0000 mg | Freq: Once | INTRAMUSCULAR | Status: AC
  Administered 2020-05-05: 40 mg via INTRAVENOUS
  Filled 2020-05-05: qty 4

## 2020-05-05 MED ORDER — ENOXAPARIN SODIUM 30 MG/0.3ML ~~LOC~~ SOLN
30.0000 mg | Freq: Every day | SUBCUTANEOUS | Status: DC
  Administered 2020-05-06 – 2020-05-08 (×3): 30 mg via SUBCUTANEOUS
  Filled 2020-05-05 (×3): qty 0.3

## 2020-05-05 MED ORDER — LABETALOL HCL 5 MG/ML IV SOLN
10.0000 mg | INTRAVENOUS | Status: DC | PRN
  Administered 2020-05-05: 2.5 mg via INTRAVENOUS
  Administered 2020-05-05: 10 mg via INTRAVENOUS
  Administered 2020-05-05: 5 mg via INTRAVENOUS
  Administered 2020-05-06 (×2): 10 mg via INTRAVENOUS
  Administered 2020-05-06: 5 mg via INTRAVENOUS
  Administered 2020-05-07 – 2020-05-08 (×3): 10 mg via INTRAVENOUS
  Filled 2020-05-05 (×8): qty 4

## 2020-05-05 MED ORDER — LOSARTAN POTASSIUM 25 MG PO TABS
25.0000 mg | ORAL_TABLET | Freq: Every day | ORAL | Status: DC
  Filled 2020-05-05: qty 1

## 2020-05-05 MED ORDER — INSULIN ASPART 100 UNIT/ML ~~LOC~~ SOLN
0.0000 [IU] | SUBCUTANEOUS | Status: DC
  Administered 2020-05-05 (×2): 4 [IU] via SUBCUTANEOUS
  Administered 2020-05-05 (×2): 8 [IU] via SUBCUTANEOUS
  Administered 2020-05-06: 2 [IU] via SUBCUTANEOUS

## 2020-05-05 MED ORDER — LABETALOL HCL 5 MG/ML IV SOLN
INTRAVENOUS | Status: AC
  Filled 2020-05-05: qty 4

## 2020-05-05 MED FILL — Potassium Chloride Inj 2 mEq/ML: INTRAVENOUS | Qty: 40 | Status: AC

## 2020-05-05 MED FILL — Heparin Sodium (Porcine) Inj 1000 Unit/ML: INTRAMUSCULAR | Qty: 30 | Status: AC

## 2020-05-05 MED FILL — Lidocaine HCl Local Preservative Free (PF) Inj 2%: INTRAMUSCULAR | Qty: 15 | Status: AC

## 2020-05-05 NOTE — Discharge Summary (Addendum)
[address]       [place] [address]             [phone]    Physician Discharge Summary  Patient ID: ADIA CRAMMER MRN: [id number] DOB/AGE: 78-Jul-1942 78 y.o.  Admit date: 05/04/2020 Discharge date: 05/09/2020  Admission Diagnoses:  Patient Active Problem List   Diagnosis Date Noted  . Severe aortic stenosis   . PAD (peripheral artery disease) ([place])   . Other specified disorders of adrenal gland ([place]) 10/31/2018  . Claudication ([place])   . Nonvenomous bug bite of shoulder or upper arm, infected, left, initial encounter 07/21/2018  . Dysuria 05/26/2018  . Cough 04/07/2018  . Aortic stenosis 12/16/2017  . Chronic headaches 11/12/2017  . Iron deficiency anemia 11/12/2017  . Type 2 diabetes mellitus with diabetic neuropathy, unspecified ([place]) 09/10/2017  . Vitamin D deficiency 09/10/2017  . Vitamin B12 deficiency 09/10/2017  . Hyperkalemia 06/03/2017  . Fatigue 06/03/2017  . 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[place])   . 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 Spinal stenosis of lumbar region 10/20/2014  . Dense breasts 12/22/2012  . History of breast cancer 12/22/2012  . Carcinoma of breast treated with adjuvant chemotherapy ([place])   . 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 female breast ([place]) 12/20/2010  . HYPERCHOLESTEROLEMIA 09/14/2008  . DEPRESSION 09/14/2008  . GERD 09/14/2008  . Type 2 diabetes, uncontrolled, with renal manifestation ([place]) 05/20/2008  . Essential hypertension 04/17/2007  . Coronary artery disease 04/17/2007  . ARTHRALGIA 04/17/2007  . Hypothyroidism 04/03/2007  . OTHER ABNORMAL BLOOD CHEMISTRY 12/03/2006   Discharge Diagnoses:  Patient Active Problem List   Diagnosis Date Noted  . 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  . Severe aortic stenosis   . PAD (peripheral artery disease) ([place])   . Other specified disorders of adrenal gland  ([place]) 10/31/2018  . Claudication ([place])   . Nonvenomous bug bite of shoulder or upper arm, infected, left, initial encounter 07/21/2018  . Dysuria 05/26/2018  . Cough 04/07/2018  . Aortic stenosis 12/16/2017  . Chronic headaches 11/12/2017  . Iron deficiency anemia 11/12/2017  . Type 2 diabetes mellitus with diabetic neuropathy, unspecified ([place]) 09/10/2017  . Vitamin D deficiency 09/10/2017  . Vitamin B12 deficiency 09/10/2017  . Hyperkalemia 06/03/2017  . Fatigue 06/03/2017  . 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[place])   . 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 Spinal stenosis of lumbar region 10/20/2014  . Dense breasts 12/22/2012  . History of breast cancer 12/22/2012  . Carcinoma of breast treated with adjuvant chemotherapy ([place])   . 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 female breast ([place]) 12/20/2010  . HYPERCHOLESTEROLEMIA 09/14/2008  . DEPRESSION 09/14/2008  . GERD 09/14/2008  . Type 2 diabetes, uncontrolled, with renal manifestation ([place]) 05/20/2008  . Essential hypertension 04/17/2007  . Coronary artery disease 04/17/2007  . ARTHRALGIA 04/17/2007  . Hypothyroidism 04/03/2007  . OTHER ABNORMAL BLOOD CHEMISTRY 12/03/2006   Discharged Condition: good  History of Present Illness:  Patient is a 80 year old female with history of aortic stenosis, coronary artery disease, hypertension, hyperlipidemia, type 2 diabetes mellitus with complications, peripheral arterial disease with chronic claudication, remote history of tobacco abuse, stage IV chronic kidney disease, anxiety and depression who returns to the office for management of aortic stenosis and multivessel coronary artery disease.   Patient's  cardiac history dates back to 2007 when she underwent PCI and stenting of the right coronary artery.  She reportedly did not have a heart attack at that time.  For the last several years she has been followed intermittently by Dr. Johnsie Cancel.  Nuclear stress test in  2017 revealed no ischemia.  Echocardiograms have documented the presence of normal left ventricular systolic function with aortic stenosis and mitral regurgitation that has gradually progressed in severity.  Over the last 2 to 3 months the patient has developed symptoms of exertional shortness of breath with vague choking sensation in her throat.  Symptoms have always been associated with physical exertion and are promptly relieved by rest, although they may occur with relatively low level activity.  She has not had any resting shortness of breath or chest discomfort.  She denies any history of PND, orthopnea, or lower extremity edema.  She has never had any dizzy spells or syncope.  She was seen urgently in the office by Dr. Johney Frame and transthoracic echocardiogram performed February 29, 2020 revealed progression in the severity of the patient's aortic valve disease with at least moderate and probably stage D3 severe symptomatic aortic stenosis.  Left ventricular systolic function remain normal with ejection fraction estimated 65 to 70%.  There was moderate mitral regurgitation which appeared to be secondary (functional) and somewhat worse than previous echocardiograms.  The patient was referred to the multidisciplinary heart valve clinic and has been evaluated previously by Dr. Angelena Form.  Diagnostic cardiac catheterization was performed March 17, 2020.  Catheterization confirmed the presence of aortic stenosis with peak to peak and mean transvalvular gradients measured 34 and 26.9 mmHg, respectively.  There was severe multivessel coronary artery disease with long segment severe stenosis of the mid left anterior descending coronary artery and high-grade ostial stenosis of the right coronary artery with continued patency in the distal segment of the right coronary artery.  Cardiac output remain normal.  There was mild to moderate pulmonary hypertension.  Patient was referred for surgical consultation.  She was  evaluated by Dr. Roxy Manns who was in agreement the patient would benefit from AVR and CABG procedure.  The risks and benefits of the procedure were explained to the patient and she was agreeable to proceed.  Hospital Course:  The patient presented to [facility] on 05/04/2020.  She was taken to the operating room and underwent CABG x 2 utilizing LIMA to LAD and SVG to PDA.  She underwent replacement of AVR with an Edwards Inspiris Resilia 23 mm Pericardial Tissue Valve.  Finally she underwent endoscopic harvest of greater saphenous vein from her right thigh.  She tolerated the procedure without difficulty and was taken to the SICU in stable condition.  She was extubated the evening of surgery.  During her stay in the SICU the patient was weaned off Epinephrine and Milrinone as hemodynamics allowed.  She was in NSR post operatively.  Her chest tubes and arterial lines were removed without difficulty.  She was felt medically stable for transfer to the progressive care unit on 05/08/2020. She continued to progress on the floor and we were able to remove her EPW. We continued lasix for fluid overload. Her Blood pressure continued to climb therefore we increased her losartan to 100mg  daily. We restarted her blood glucose medications and encouraged her to follow-up with her diabetes doctor. She is encouraged to continue using her incentive spirometer at home and ambulate several times a day. Today, she is ambulating without issue, tolerating room  air, her incisions are healing well, and she is ready for discharge home.    Consults: None  Significant Diagnostic Studies:   Angiography:    Ost RCA to Prox RCA lesion is 80% stenosed.  Mid RCA lesion is 30% stenosed.  RPDA lesion is 60% stenosed.  1st Mrg lesion is 50% stenosed.  Prox Cx to Mid Cx lesion is 80% stenosed.  Mid LAD lesion is 80% stenosed.   1. No significant disease in the left main artery 2. Severe mid LAD stenosis. The LAD  bifurcated distally beyond this lesion and the diagonal branch reaches the apex.  3. Moderate caliber non-dominant Circumflex with severe mid stenosis. The vessel is relatively small beyond the stenosis. The small to moderate caliber obtuse marginal branch arises prior to the stenosis and has moderate ostial stenosis.  4. The RCA is a large dominant artery. There is severe, heavily calcified stenosis at the ostium of the RCA. The distal stented segment is patent. There is moderately severe stenosis in the moderate caliber PDA.  5. Severe aortic stenosis (mean gradient 26.9 mmHg, peak gradient 34 mmHg)  Echocardiogram:  IMPRESSIONS   1. The AoV is not well visualized but appears severely calcified with  restricted leaflet motion. Vmax 3.5 m/s, MG 33 mmHG, AVA 0.66 cm2, DI  0.26. The LV cavity is small and SVi = 34 cc/m2. Finding could represent  low flow low gradient severe AS, and  would recommend either TEE or aortic valve calcium score for clarification  if clinically indicated. The aortic valve is calcified. There is severe  calcifcation of the aortic valve. There is severe thickening of the aortic  valve. Aortic valve  regurgitation is not visualized. Moderate to severe aortic valve stenosis.  2. MR is now moderate and likely worsened in the setting of AS  progression. The mitral valve is grossly normal. Moderate mitral valve  regurgitation. No evidence of mitral stenosis.  3. Left ventricular ejection fraction, by estimation, is 65 to 70%. The  left ventricle has normal function. The left ventricle has no regional  wall motion abnormalities. There is moderate concentric left ventricular  hypertrophy. Left ventricular  diastolic function could not be evaluated.  4. Right ventricular systolic function is normal. The right ventricular  size is normal. Tricuspid regurgitation signal is inadequate for assessing  PA pressure.  5. The inferior vena cava is normal in size with greater  than 50%  respiratory variability, suggesting right atrial pressure of 3 mmHg.   Treatments: surgery:   Preoperative Diagnosis:       ? Severe Aortic Stenosis ? Severe Multi-vessel Coronary Artery Disease  Postoperative Diagnosis:    Same  Procedure:        Aortic Valve Replacement             Edwards Inspiris Resilia Stented Bovine Pericardial Tissue Valve (size 23 mm, ref # [id number], serial # [id number])   Coronary Artery Bypass Grafting x 2             Left Internal Mammary Artery to Third Diagonal Branch of Distal Left Anterior Descending Coronary Artery             Saphenous Vein Graft to Posterior Descending Coronary Artery             Endoscopic Vein Harvest from Right Thigh   Surgeon:        Valentina Gu. Roxy Manns, MD  Assistant:       Nicholes Rough, PA-C   Discharge Exam:  Blood pressure (!) 149/68, pulse 90, temperature 98 F (36.7 C), temperature source Oral, resp. rate 20, height 5\' 3"  (1.6 m), weight 68.7 kg, SpO2 99 %.    General appearance: alert, cooperative and no distress Heart: regular rate and rhythm, S1, S2 normal, no murmur, click, rub or gallop Lungs: clear to auscultation bilaterally Abdomen: soft, non-tender; bowel sounds normal; no masses,  no organomegaly Extremities: extremities normal, atraumatic, no cyanosis or edema Wound: clean and dry    Discharge Medications:  The patient has been discharged on:   1.Beta Blocker:  Yes [  yes ]                              No   [   ]                              If No, reason:  2.Ace Inhibitor/ARB: Yes [ yes  ]                                     No  [    ]                                     If No, reason:  3.Statin:   Yes [   ]                  No  [ no  ]                  If No, reason: allergy  4.Shela CommonsVelta Addison  [ yes  ]                  No   [   ]                  If No, reason:     Allergies as of 05/09/2020      Reactions   Codeine Nausea And Vomiting, Other (See Comments)   Severe  stomach cramps   Antihistamines, Diphenhydramine-type Other (See Comments)   Causes hyperactivity   Gabapentin    Urinary incontinence   Statins Other (See Comments)   Joint pains   Sulfa Antibiotics    Unknown to pt   Erythromycin Hives, Rash   Due to dental work about 50 years ago. Was used in packing and resulted in rash/hives inside and outside of mouth.      Medication List    STOP taking these medications   nitroGLYCERIN 0.4 MG SL tablet Commonly known as: NITROSTAT   SIMPONI ARIA IV     TAKE these medications   Accu-Chek FastClix Lancets Misc USE TO CHECK BLOOD SUGARS 3 TIMES DAILY   Accu-Chek Guide test strip Generic drug: glucose blood TEST ONCE DAILY   acetaminophen 500 MG tablet Commonly known as: TYLENOL Take 1,000 mg by mouth every 6 (six) hours as needed for moderate pain or headache.   aspirin EC 81 MG tablet Take 81 mg by mouth at bedtime.   calcium carbonate 500 MG chewable tablet Commonly known as: TUMS - dosed in mg elemental calcium Chew 2 tablets by mouth 3 (three) times daily as needed for indigestion or heartburn.  clobetasol cream 0.05 % Commonly known as: TEMOVATE Apply 1 application topically 2 (two) times daily.   Fish Oil 1200 MG Caps Take 1,200 mg by mouth in the morning and at bedtime.   furosemide 40 MG tablet Commonly known as: LASIX Take 1 tablet (40 mg total) by mouth daily. Take 40mg  daily for 3 days then go back to your home dose of 20mg  daily. What changed:   medication strength  how much to take  when to take this  additional instructions   glipiZIDE 10 MG 24 hr tablet Commonly known as: GLUCOTROL XL Take 1 tablet (10 mg total) by mouth daily at 12 noon.   levothyroxine 88 MCG tablet Commonly known as: SYNTHROID Take 1 tablet (88 mcg total) by mouth daily.   losartan 50 MG tablet Commonly known as: COZAAR Take 2 tablets (100 mg total) by mouth daily. What changed: See the new instructions.   Magnesium 250  MG Tabs Take 250 mg by mouth at bedtime.   melatonin 3 MG Tabs tablet Take 3 mg by mouth at bedtime.   metFORMIN 500 MG tablet Commonly known as: GLUCOPHAGE Take 1 tablet (500 mg total) by mouth daily with breakfast.   metoprolol tartrate 25 MG tablet Commonly known as: LOPRESSOR Take 1 tablet (25 mg total) by mouth 2 (two) times daily.   nitrofurantoin (macrocrystal-monohydrate) 100 MG capsule Commonly known as: MACROBID Take 1 capsule (100 mg total) by mouth 2 (two) times daily.   pantoprazole 40 MG tablet Commonly known as: PROTONIX Take 1 tablet (40 mg total) by mouth daily.   PARoxetine 20 MG tablet Commonly known as: PAXIL Take 1 tablet (20 mg total) by mouth in the morning.   pioglitazone 30 MG tablet Commonly known as: Actos Take 1 tablet (30 mg total) by mouth daily. What changed: when to take this   potassium chloride SA 20 MEQ tablet Commonly known as: KLOR-CON Take 1 tablet (20 mEq total) by mouth daily.   sennosides-docusate sodium 8.6-50 MG tablet Commonly known as: SENOKOT-S Take 1 tablet by mouth daily.   sitaGLIPtin 100 MG tablet Commonly known as: Januvia Take 1 tablet (100 mg total) by mouth daily. What changed: when to take this   traMADol 50 MG tablet Commonly known as: ULTRAM Take 1 tablet (50 mg total) by mouth every 6 (six) hours as needed for moderate pain.   valACYclovir 1000 MG tablet Commonly known as: VALTREX TAKE 1 TABLET BY MOUTH 2  TIMES DAILY AS NEEDED FOR  VIRAL INFECTION. What changed: See the new instructions.   vitamin B-12 500 MCG tablet Commonly known as: CYANOCOBALAMIN Take 1,000 mcg by mouth daily with lunch.   Vitamin D3 25 MCG (1000 UT) Caps Take 1,000 Units by mouth daily before lunch.       Follow-up Information    Rexene Alberts, MD Follow up on 05/30/2020.   Specialty: Cardiothoracic Surgery Why: Appointment is at 1:00, please get CXR at 12:30 at [place] located on first floor of our office  building Contact information: [place] [address] [phone]        Cirigliano, [place], DO. Call in 1 day(s).   Specialty: Family Medicine Contact information: [place] [address] [phone]        Josue Hector, MD Follow up on 05/30/2020.   Specialty: Cardiology Why: Appointment is at 8:45 Contact information: [address] [place] [address] [phone]  Signed: Nicholes Rough, PA-C 05/09/2020, 9:46 AM

## 2020-05-05 NOTE — Discharge Instructions (Signed)

## 2020-05-05 NOTE — Progress Notes (Addendum)
TCTS DAILY ICU PROGRESS NOTE                   [address]            [place] [address]          [phone]   1 Day Post-Op Procedure(s) (LRB): AORTIC VALVE REPLACEMENT (AVR) USING INSPIRIS 23MM RESILIA AORTIC VALVE (N/A) CORONARY ARTERY BYPASS GRAFTING (CABG), ON PUMP, TIMES TWO, USING LEFT INTERNAL MAMMARY ARTERY AND RIGHT ENDOSCOPICALLY HARVESTED GREATER SAPHENOUS VEIN (N/A) TRANSESOPHAGEAL ECHOCARDIOGRAM (TEE) (N/A)  Total Length of Stay:  LOS: 1 day   Subjective:  Patient doing okay.  Having some pain.  Denies N/V.  Objective: Vital signs in last 24 hours: Temp:  [97.34 F (36.3 C)-98.42 F (36.9 C)] 98.42 F (36.9 C) (04/14 0700) Pulse Rate:  [70-96] 95 (04/14 0700) Cardiac Rhythm: Normal sinus rhythm (04/14 0400) Resp:  [0-30] 10 (04/13 2300) BP: (100-174)/(50-79) 124/58 (04/14 0700) SpO2:  [93 %-100 %] 96 % (04/14 0700) Arterial Line BP: (91-158)/(41-64) 129/46 (04/14 0700) FiO2 (%):  [40 %-50 %] 40 % (04/13 2005) Weight:  [76.6 kg] 76.6 kg (04/14 0452)  Filed Weights   05/04/20 0628 05/05/20 0452  Weight: 67.2 kg 76.6 kg    Weight change: 9.4 kg   Hemodynamic parameters for last 24 hours: PAP: (23-55)/(9-34) 24/10 CVP:  [15 mmHg-65 mmHg] 15 mmHg CO:  [3 L/min-4.6 L/min] 4.6 L/min CI:  [1.7 L/min/m2-2.7 L/min/m2] 2.7 L/min/m2  Intake/Output from previous day: 04/13 0701 - 04/14 0700 In: 7955.7 [I.V.:5150.4; Blood:413; IV Piggyback:2392.3] Out: 2956 [Urine:1340; Blood:774; Chest Tube:842]  Current Meds: Scheduled Meds: . acetaminophen  1,000 mg Oral Q6H  . aspirin EC  325 mg Oral Daily  . bisacodyl  10 mg Oral Daily   Or  . bisacodyl  10 mg Rectal Daily  . Chlorhexidine Gluconate Cloth  6 each Topical Daily  . docusate sodium  200 mg Oral Daily  . [START ON 05/06/2020] enoxaparin (LOVENOX) injection  30 mg Subcutaneous QHS  . insulin aspart  0-24 Units Subcutaneous Q4H  . levothyroxine  88 mcg Oral Daily  . [START ON 05/06/2020]  pantoprazole  40 mg Oral QHS  . PARoxetine  20 mg Oral q AM  . potassium chloride  20 mEq Oral Q4H  . sodium chloride flush  10-40 mL Intracatheter Q12H  . sodium chloride flush  3 mL Intravenous Q12H   Continuous Infusions: . sodium chloride    . cefUROXime (ZINACEF)  IV Stopped (05/04/20 2122)  . epinephrine 1 mcg/min (05/05/20 0700)  . lactated ringers Stopped (05/04/20 1925)  . lactated ringers 20 mL/hr at 05/05/20 0700  . milrinone 0.3 mcg/kg/min (05/05/20 0700)   PRN Meds:.dextrose, fentaNYL (SUBLIMAZE) injection, metoprolol tartrate, ondansetron (ZOFRAN) IV, sodium chloride flush, sodium chloride flush, traMADol  General appearance: alert, cooperative and no distress Heart: regular rate and rhythm Lungs: clear to auscultation bilaterally Abdomen: soft, non-tender; bowel sounds normal; no masses,  no organomegaly Extremities: edema trace Wound: clean and dry  Lab Results: CBC: Recent Labs    05/04/20 2227 05/05/20 0525  WBC 11.4* 10.4  HGB 9.4* 9.1*  HCT 28.1* 27.2*  PLT 101* 99*   BMET:  Recent Labs    05/04/20 2227 05/05/20 0525  NA 140 139  K 3.7 3.9  CL 111 109  CO2 25 23  GLUCOSE 169* 154*  BUN 24* 23  CREATININE 1.30* 1.35*  CALCIUM 7.5* 7.5*    CMET: Lab Results  Component  Value Date   WBC 10.4 05/05/2020   HGB 9.1 (L) 05/05/2020   HCT 27.2 (L) 05/05/2020   PLT 99 (L) 05/05/2020   GLUCOSE 154 (H) 05/05/2020   CHOL 189 06/24/2019   TRIG 292.0 (H) 06/24/2019   HDL 29.60 (L) 06/24/2019   LDLDIRECT 101.0 06/24/2019   LDLCALC 83 05/13/2008   ALT 15 05/02/2020   AST 21 05/02/2020   NA 139 05/05/2020   K 3.9 05/05/2020   CL 109 05/05/2020   CREATININE 1.35 (H) 05/05/2020   BUN 23 05/05/2020   CO2 23 05/05/2020   TSH 2.44 06/23/2019   INR 1.4 (H) 05/04/2020   HGBA1C 8.1 (H) 05/02/2020   MICROALBUR 25.9 (H) 11/20/2019      PT/INR:  Recent Labs    05/04/20 1544  LABPROT 17.6*  INR 1.4*   Radiology: DG Chest Port 1 View  Result  Date: 05/05/2020 CLINICAL DATA:  Respiratory distress EXAM: PORTABLE CHEST 1 VIEW COMPARISON:  05/04/2020 FINDINGS: Interval extubation. Pulmonary insufflation is preserved. Right internal jugular Swan-Ganz catheter with its tip within the main pulmonary artery, bilateral chest tubes, and mediastinal drain are unchanged. Small left apical pneumothorax is present, new since prior examination. Tiny bilateral pleural effusions are present. Trace bilateral perihilar interstitial pulmonary edema is again seen. Aortic valve replacement and coronary artery bypass grafting has been performed. Cardiac size within normal limits. IMPRESSION: Interval extubation with preservation of pulmonary volumes. Interval development of small left apical pneumothorax. Left chest tube in place. Right chest tube in place.  No pneumothorax on the right. Are stable mild perihilar interstitial pulmonary edema and tiny bilateral pleural effusions. These results will be called to the ordering clinician or representative by the Radiologist Assistant, and communication documented in the PACS or [employer]. Electronically Signed   By: Fidela Salisbury MD   On: 05/05/2020 06:34   DG Chest Port 1 View  Result Date: 05/04/2020 CLINICAL DATA:  Status post coronary bypass graft. EXAM: PORTABLE CHEST 1 VIEW COMPARISON:  May 02, 2020. FINDINGS: Stable cardiomediastinal silhouette. Endotracheal and nasogastric tubes are in good position. Right internal jugular Swan-Ganz catheter is noted with tip in expected position of main pulmonary artery. Status post aortic valve repair. Left-sided chest tube is noted without pneumothorax. Mild bilateral atelectasis is noted. Bony thorax is unremarkable. IMPRESSION: Endotracheal and nasogastric tubes are in grossly good position. Left-sided chest is noted without pneumothorax. Electronically Signed   By: Marijo Conception M.D.   On: 05/04/2020 15:49   ECHO INTRAOPERATIVE TEE  Result Date: 05/04/2020   *INTRAOPERATIVE TRANSESOPHAGEAL REPORT *  Patient Name:   [name] Date of Exam: 05/04/2020 Medical Rec #:  [id number]       Height:       63.0 in Accession #:    [id number]      Weight:       148.1 lb Date of Birth:  [birth year]      BSA:          1.70 m Patient Age:    80 years        BP:           140/72 mmHg Patient Gender: F               HR:           80 bpm. Exam Location:  Anesthesiology Transesophogeal exam was perform intraoperatively during surgical procedure. Patient was closely monitored under general anesthesia during the entirety of examination. Indications:  Aoritic stenosis, CAD Performing Phys: 18 [address] Mackie H Dalana Pfahler Complications: No known complications during this procedure. POST-OP IMPRESSIONS - Left Ventricle: The left ventricle is unchanged from pre-bypass. - Right Ventricle: The right ventricle appears unchanged from pre-bypass. - Aorta: The aorta appears unchanged from pre-bypass. - Left Atrial Appendage: The left atrial appendage appears unchanged from pre-bypass. - Aortic Valve: No stenosis present. A bioprosthetic bioprosthetic valve was placed, leaflets are freely mobile and leaflets thin. There is no regurgitation. - Mitral Valve: There is moderate to severe regurgitation.Immediately off bypass MR significantly worse than prebypass. Discussed with Dr. Roxy Manns. Anterior leaflet overriding posterior and jet now directe posteriorly. MR improved with time and with chest closure, and coaptation improved as well. - Tricuspid Valve: The tricuspid valve appears unchanged from pre-bypass. - Pulmonic Valve: The pulmonic valve appears unchanged from pre-bypass. - Interatrial Septum: The interatrial septum appears unchanged from pre-bypass. - Pericardium: The pericardium appears unchanged from pre-bypass. PRE-OP FINDINGS  Left Ventricle: The left ventricle has normal systolic function, with an ejection fraction of 55-60%. The cavity size was normal. There is severe concentric left ventricular  hypertrophy. There is severe concentric left ventricular hypertrophy. Right Ventricle: The right ventricle has normal systolic function. The cavity was normal. There is no increase in right ventricular wall thickness. Left Atrium: Left atrial size was dilated. No left atrial/left atrial appendage thrombus was detected. Right Atrium: Right atrial size was dilated. Interatrial Septum: No atrial level shunt detected by color flow Doppler. There is no evidence of a patent foramen ovale. Pericardium: There is no evidence of pericardial effusion. Mitral Valve: The mitral valve is normal in structure. Mitral valve regurgitation is moderate by color flow Doppler. Tricuspid Valve: The tricuspid valve was normal in structure. Tricuspid valve regurgitation is trivial by color flow Doppler. There is no evidence of tricuspid valve vegetation. Aortic Valve: The aortic valve is bicuspid Aortic valve regurgitation was not visualized by color flow Doppler. There is moderate stenosis of the aortic valve. There is no evidence of aortic valve vegetation. There is severe thickening and severe calcifcation present with moderately decreased mobility. Pulmonic Valve: The pulmonic valve was normal in structure. Pulmonic valve regurgitation is trivial by color flow Doppler. Aorta: There is evidence of plaque in the descending aorta, ascending aorta and aortic arch; Grade III, measuring 3-18mm in size. Shunts: There is no evidence of an atrial septal defect. +-------------+---------++ AORTIC VALVE           +-------------+---------++ AV Mean Grad:32.0 mmHg +-------------+---------++  Nolon Nations MD Electronically signed by Nolon Nations MD Signature Date/Time: 05/04/2020/4:10:29 PM    Final      Assessment/Plan: S/P Procedure(s) (LRB): AORTIC VALVE REPLACEMENT (AVR) USING INSPIRIS 23MM RESILIA AORTIC VALVE (N/A) CORONARY ARTERY BYPASS GRAFTING (CABG), ON PUMP, TIMES TWO, USING LEFT INTERNAL MAMMARY ARTERY AND RIGHT  ENDOSCOPICALLY HARVESTED GREATER SAPHENOUS VEIN (N/A) TRANSESOPHAGEAL ECHOCARDIOGRAM (TEE) (N/A)  1. CV- NSR on EKG- on Epinephrine, Milrinone- wean as hemodynamics allow 2. Pulm- no acute issues, continue IS, CT output 842 3. Renal- creatinine stable, K is WNL, weight is elevated, may benefit from Lasix later today 4. Expected post operative blood loss anemia, Hgb stable 9.1 5. Expected post operative thrombocytopenia, moderate ct down to 99 6. DM- sugars controlled, transition off insulin, start SSIP 7. Dispo- patient stable, POD #1 progression orders per Dr. Geraldo Docker, PA-C 05/05/2020 7:47 AM   I have seen and examined the patient and agree with the assessment and plan as outlined.  Looks  remarkably good under the circumstances.  NSR w/ stable hemodynamics on milrinone and very low dose Epi, PA pressures lower than preop.  Breathing comfortably on nasal cannula, asking for breakfast.   Mobilize  Wean drips slowly  D/C lines  Rexene Alberts, MD 05/05/2020 8:08 AM

## 2020-05-05 NOTE — Progress Notes (Signed)
Patient ID: DEBI COUSIN, female   DOB: 1940-04-24, 80 y.o.   MRN: [id number] TCTS Evening Rounds:  Hemodynamically stable in sinus rhythm on milrinone 0.3.  sats 97%   Urine output good with lasix.  BMET    Component Value Date/Time   NA 137 05/05/2020 1644   NA 134 03/25/2020 1031   NA 138 02/21/2016 1035   K 4.2 05/05/2020 1644   K 4.5 02/21/2016 1035   CL 109 05/05/2020 1644   CL 106 04/10/2012 0921   CO2 22 05/05/2020 1644   CO2 24 02/21/2016 1035   GLUCOSE 244 (H) 05/05/2020 1644   GLUCOSE 194 (H) 02/21/2016 1035   GLUCOSE 216 (H) 04/10/2012 0921   BUN 27 (H) 05/05/2020 1644   BUN 36 (H) 03/25/2020 1031   BUN 18.7 02/21/2016 1035   CREATININE 1.51 (H) 05/05/2020 1644   CREATININE 1.4 (H) 02/21/2016 1035   CALCIUM 7.8 (L) 05/05/2020 1644   CALCIUM 9.0 02/21/2016 1035   GFRNONAA 35 (L) 05/05/2020 1644   GFRAA 32 (L) 03/11/2020 0913   CBC    Component Value Date/Time   WBC 10.8 (H) 05/05/2020 1644   RBC 3.06 (L) 05/05/2020 1644   HGB 9.4 (L) 05/05/2020 1644   HGB 12.3 02/26/2020 0000   HGB 12.0 02/21/2016 1035   HCT 28.1 (L) 05/05/2020 1644   HCT 36.7 02/26/2020 0000   HCT 36.4 02/21/2016 1035   PLT 88 (L) 05/05/2020 1644   PLT 222 02/26/2020 0000   MCV 91.8 05/05/2020 1644   MCV 87 02/26/2020 0000   MCV 87.4 02/21/2016 1035   MCH 30.7 05/05/2020 1644   MCHC 33.5 05/05/2020 1644   RDW 15.9 (H) 05/05/2020 1644   RDW 13.1 02/26/2020 0000   RDW 14.2 02/21/2016 1035   LYMPHSABS 2,870 04/01/2020 0000   LYMPHSABS 2.6 02/21/2016 1035   MONOABS 0.3 04/29/2019 1002   MONOABS 0.7 02/21/2016 1035   EOSABS 163 04/01/2020 0000   EOSABS 0.4 02/21/2016 1035   BASOSABS 54 04/01/2020 0000   BASOSABS 0.1 02/21/2016 1035

## 2020-05-05 NOTE — Hospital Course (Addendum)
History of Present Illness:  Patient is a 80 year old female with history of aortic stenosis, coronary artery disease, hypertension, hyperlipidemia, type 2 diabetes mellitus with complications, peripheral arterial disease with chronic claudication, remote history of tobacco abuse, stage IV chronic kidney disease, anxiety and depression who returns to the office for management of aortic stenosis and multivessel coronary artery disease.   Patient's cardiac history dates back to 2007 when she underwent PCI and stenting of the right coronary artery.  She reportedly did not have a heart attack at that time.  For the last several years she has been followed intermittently by Dr. Johnsie Cancel.  Nuclear stress test in 2017 revealed no ischemia.  Echocardiograms have documented the presence of normal left ventricular systolic function with aortic stenosis and mitral regurgitation that has gradually progressed in severity.  Over the last 2 to 3 months the patient has developed symptoms of exertional shortness of breath with vague choking sensation in her throat.  Symptoms have always been associated with physical exertion and are promptly relieved by rest, although they may occur with relatively low level activity.  She has not had any resting shortness of breath or chest discomfort.  She denies any history of PND, orthopnea, or lower extremity edema.  She has never had any dizzy spells or syncope.  She was seen urgently in the office by Dr. Johney Frame and transthoracic echocardiogram performed February 29, 2020 revealed progression in the severity of the patient's aortic valve disease with at least moderate and probably stage D3 severe symptomatic aortic stenosis.  Left ventricular systolic function remain normal with ejection fraction estimated 65 to 70%.  There was moderate mitral regurgitation which appeared to be secondary (functional) and somewhat worse than previous echocardiograms.  The patient was referred to the  multidisciplinary heart valve clinic and has been evaluated previously by Dr. Angelena Form.  Diagnostic cardiac catheterization was performed March 17, 2020.  Catheterization confirmed the presence of aortic stenosis with peak to peak and mean transvalvular gradients measured 34 and 26.9 mmHg, respectively.  There was severe multivessel coronary artery disease with long segment severe stenosis of the mid left anterior descending coronary artery and high-grade ostial stenosis of the right coronary artery with continued patency in the distal segment of the right coronary artery.  Cardiac output remain normal.  There was mild to moderate pulmonary hypertension.  Patient was referred for surgical consultation.  She was evaluated by Dr. Roxy Manns who was in agreement the patient would benefit from AVR and CABG procedure.  The risks and benefits of the procedure were explained to the patient and she was agreeable to proceed.  Hospital Course:  The patient presented to [facility] on [facility].  She was taken to the operating room and underwent CABG x 2 utilizing LIMA to LAD and SVG to PDA.  She underwent replacement of AVR with an Edwards Inspiris Resilia 23 mm Pericardial Tissue Valve.  Finally she underwent endoscopic harvest of greater saphenous vein from her right thigh.  She tolerated the procedure without difficulty and was taken to the SICU in stable condition.  She was extubated the evening of surgery.  During her stay in the SICU the patient was weaned off Epinephrine and Milrinone as hemodynamics allowed.  She was in NSR post operatively.  Her chest tubes and arterial lines were removed without difficulty.  She was felt medically stable for transfer to the progressive care unit on 05/08/2020. She continued to progress on the floor and we were able to remove  her EPW. We continued lasix for fluid overload.

## 2020-05-06 ENCOUNTER — Inpatient Hospital Stay (HOSPITAL_COMMUNITY): Payer: Medicare Other

## 2020-05-06 ENCOUNTER — Other Ambulatory Visit: Payer: Self-pay | Admitting: Medical

## 2020-05-06 DIAGNOSIS — Z953 Presence of xenogenic heart valve: Secondary | ICD-10-CM

## 2020-05-06 LAB — TYPE AND SCREEN
ABO/RH(D): O NEG
Antibody Screen: NEGATIVE
Unit division: 0
Unit division: 0
Unit division: 0
Unit division: 0
Unit division: 0
Unit division: 0

## 2020-05-06 LAB — BPAM RBC
Blood Product Expiration Date: 202204292359
Blood Product Expiration Date: 202204292359
Blood Product Expiration Date: 202205022359
Blood Product Expiration Date: 202205022359
Blood Product Expiration Date: 202205142359
Blood Product Expiration Date: 202205142359
ISSUE DATE / TIME: 202204131131
ISSUE DATE / TIME: 202204131131
ISSUE DATE / TIME: 202204131227
ISSUE DATE / TIME: 202204131227
ISSUE DATE / TIME: 202204150740
ISSUE DATE / TIME: 202204150740
Unit Type and Rh: 5100
Unit Type and Rh: 5100
Unit Type and Rh: 9500
Unit Type and Rh: 9500
Unit Type and Rh: 9500
Unit Type and Rh: 9500

## 2020-05-06 LAB — CBC
HCT: 29.4 % — ABNORMAL LOW (ref 36.0–46.0)
Hemoglobin: 9.7 g/dL — ABNORMAL LOW (ref 12.0–15.0)
MCH: 30.6 pg (ref 26.0–34.0)
MCHC: 33 g/dL (ref 30.0–36.0)
MCV: 92.7 fL (ref 80.0–100.0)
Platelets: 92 10*3/uL — ABNORMAL LOW (ref 150–400)
RBC: 3.17 MIL/uL — ABNORMAL LOW (ref 3.87–5.11)
RDW: 16.2 % — ABNORMAL HIGH (ref 11.5–15.5)
WBC: 12.1 10*3/uL — ABNORMAL HIGH (ref 4.0–10.5)
nRBC: 0 % (ref 0.0–0.2)

## 2020-05-06 LAB — SURGICAL PATHOLOGY

## 2020-05-06 LAB — BASIC METABOLIC PANEL
Anion gap: 6 (ref 5–15)
BUN: 25 mg/dL — ABNORMAL HIGH (ref 8–23)
CO2: 23 mmol/L (ref 22–32)
Calcium: 8 mg/dL — ABNORMAL LOW (ref 8.9–10.3)
Chloride: 107 mmol/L (ref 98–111)
Creatinine, Ser: 1.52 mg/dL — ABNORMAL HIGH (ref 0.44–1.00)
GFR, Estimated: 35 mL/min — ABNORMAL LOW (ref >60)
Glucose, Bld: 169 mg/dL — ABNORMAL HIGH (ref 70–99)
Potassium: 4.2 mmol/L (ref 3.5–5.1)
Sodium: 136 mmol/L (ref 135–145)

## 2020-05-06 LAB — GLUCOSE, CAPILLARY
Glucose-Capillary: 151 mg/dL — ABNORMAL HIGH (ref 70–99)
Glucose-Capillary: 160 mg/dL — ABNORMAL HIGH (ref 70–99)
Glucose-Capillary: 202 mg/dL — ABNORMAL HIGH (ref 70–99)
Glucose-Capillary: 188 mg/dL — ABNORMAL HIGH (ref 70–99)
Glucose-Capillary: 248 mg/dL — ABNORMAL HIGH (ref 70–99)

## 2020-05-06 MED ORDER — METOPROLOL TARTRATE 25 MG PO TABS
25.0000 mg | ORAL_TABLET | Freq: Two times a day (BID) | ORAL | Status: DC
  Administered 2020-05-06 – 2020-05-09 (×7): 25 mg via ORAL
  Filled 2020-05-06 (×7): qty 1

## 2020-05-06 MED ORDER — ~~LOC~~ CARDIAC SURGERY, PATIENT & FAMILY EDUCATION: Freq: Once | Status: DC

## 2020-05-06 MED ORDER — FUROSEMIDE 10 MG/ML IJ SOLN
40.0000 mg | Freq: Two times a day (BID) | INTRAMUSCULAR | Status: DC
  Administered 2020-05-06 – 2020-05-08 (×6): 40 mg via INTRAVENOUS
  Filled 2020-05-06 (×6): qty 4

## 2020-05-06 MED ORDER — POTASSIUM CHLORIDE CRYS ER 20 MEQ PO TBCR
20.0000 meq | EXTENDED_RELEASE_TABLET | Freq: Two times a day (BID) | ORAL | Status: DC
  Administered 2020-05-07 – 2020-05-09 (×5): 20 meq via ORAL
  Filled 2020-05-06 (×5): qty 1

## 2020-05-06 MED ORDER — METOPROLOL TARTRATE 25 MG PO TABS: 25.0000 mg | ORAL_TABLET | Freq: Two times a day (BID) | ORAL | Status: DC

## 2020-05-06 MED ORDER — MELATONIN 3 MG PO TABS
3.0000 mg | ORAL_TABLET | Freq: Every evening | ORAL | Status: DC | PRN
  Administered 2020-05-06: 3 mg via ORAL
  Filled 2020-05-06: qty 1

## 2020-05-06 MED ORDER — INSULIN ASPART 100 UNIT/ML ~~LOC~~ SOLN
0.0000 [IU] | Freq: Three times a day (TID) | SUBCUTANEOUS | Status: DC
  Administered 2020-05-06: 4 [IU] via SUBCUTANEOUS
  Administered 2020-05-06: 2 [IU] via SUBCUTANEOUS
  Administered 2020-05-06 – 2020-05-07 (×4): 8 [IU] via SUBCUTANEOUS
  Administered 2020-05-07: 12 [IU] via SUBCUTANEOUS
  Administered 2020-05-07: 8 [IU] via SUBCUTANEOUS
  Administered 2020-05-08: 20 [IU] via SUBCUTANEOUS
  Administered 2020-05-08: 12 [IU] via SUBCUTANEOUS
  Administered 2020-05-08: 4 [IU] via SUBCUTANEOUS
  Administered 2020-05-08: 16 [IU] via SUBCUTANEOUS
  Administered 2020-05-09: 4 [IU] via SUBCUTANEOUS

## 2020-05-06 MED ORDER — LOSARTAN POTASSIUM 50 MG PO TABS
50.0000 mg | ORAL_TABLET | Freq: Every day | ORAL | Status: DC
  Administered 2020-05-06 – 2020-05-08 (×3): 50 mg via ORAL
  Filled 2020-05-06 (×3): qty 1

## 2020-05-06 MED ORDER — ASPIRIN EC 81 MG PO TBEC
81.0000 mg | DELAYED_RELEASE_TABLET | Freq: Every day | ORAL | Status: DC
  Administered 2020-05-06 – 2020-05-08 (×3): 81 mg via ORAL
  Filled 2020-05-06 (×3): qty 1

## 2020-05-06 MED ORDER — PANTOPRAZOLE SODIUM 40 MG PO TBEC: 40.0000 mg | DELAYED_RELEASE_TABLET | Freq: Every day | ORAL | Status: DC

## 2020-05-06 NOTE — Progress Notes (Signed)
TCTS Evening Rounds  POD #2 s/p AVR/CABG No complaints BP 131/61   Pulse 79   Temp 97.6 F (36.4 C)   Resp (!) 25   Ht 5\' 3"  (1.6 m)   Wt 76.6 kg   SpO2 98%   BMI 29.91 kg/m  Alert/oriented CTA RRR  Intake/Output Summary (Last 24 hours) at 05/06/2020 1624 Last data filed at 05/06/2020 1400 Gross per 24 hour  Intake 867.47 ml  Output 3741 ml  Net -2873.53 ml   A/p:  Making good progress Leave in ICU tonight Cap pacing wires Audrey Thull Z. Orvan Seen, [place]

## 2020-05-06 NOTE — Progress Notes (Signed)
Per orders went to cap pacing wires. At 1720 while capping wires patients HR dropped to 50's. Reattached to pacer and placed on VVI at a rate of 74.

## 2020-05-06 NOTE — Progress Notes (Addendum)
TCTS DAILY ICU PROGRESS NOTE                   [address]            [place] [address]          [phone]   2 Days Post-Op Procedure(s) (LRB): AORTIC VALVE REPLACEMENT (AVR) USING INSPIRIS 23MM RESILIA AORTIC VALVE (N/A) CORONARY ARTERY BYPASS GRAFTING (CABG), ON PUMP, TIMES TWO, USING LEFT INTERNAL MAMMARY ARTERY AND RIGHT ENDOSCOPICALLY HARVESTED GREATER SAPHENOUS VEIN (N/A) TRANSESOPHAGEAL ECHOCARDIOGRAM (TEE) (N/A)  Total Length of Stay:  LOS: 2 days   Subjective:  Up in chair, overall doing fairly well.  States pain isn't too bad.  Had some nausea after eating too much, but that has since resolved.  The patient states she didn't ambulate yesterday  Objective: Vital signs in last 24 hours: Temp:  [97.6 F (36.4 C)-99 F (37.2 C)] 98.5 F (36.9 C) (04/15 0700) Pulse Rate:  [84-104] 93 (04/15 0600) Cardiac Rhythm: Normal sinus rhythm (04/15 0500) Resp:  [17-31] 24 (04/15 0600) BP: (102-161)/(51-74) 143/67 (04/15 0600) SpO2:  [91 %-99 %] 98 % (04/15 0600) Arterial Line BP: (117-189)/(47-98) 178/63 (04/15 0600)  Filed Weights   05/04/20 0628 05/05/20 0452  Weight: 67.2 kg 76.6 kg    Weight change:    Hemodynamic parameters for last 24 hours: PAP: (28-31)/(14-17) 30/17 CO:  [4 L/min] 4 L/min CI:  [2.3 L/min/m2] 2.3 L/min/m2  Intake/Output from previous day: 04/14 0701 - 04/15 0700 In: 558 [P.O.:120; I.V.:338; IV Piggyback:100] Out: 1440 [Urine:960; Chest Tube:480]  Current Meds: Scheduled Meds: . acetaminophen  1,000 mg Oral Q6H  . aspirin EC  81 mg Oral QHS  . bisacodyl  10 mg Oral Daily   Or  . bisacodyl  10 mg Rectal Daily  . Chlorhexidine Gluconate Cloth  6 each Topical Daily  . [facility], Patient & Family Education   Does not apply Once  . docusate sodium  200 mg Oral Daily  . enoxaparin (LOVENOX) injection  30 mg Subcutaneous QHS  . furosemide  40 mg Intravenous BID  . insulin aspart  0-24 Units Subcutaneous TID  AC & HS  . levothyroxine  88 mcg Oral Daily  . losartan  50 mg Oral Daily  . pantoprazole  40 mg Oral QHS  . PARoxetine  20 mg Oral q AM  . [START ON 05/07/2020] potassium chloride  20 mEq Oral BID  . sodium chloride flush  10-40 mL Intracatheter Q12H  . sodium chloride flush  3 mL Intravenous Q12H   Continuous Infusions: . sodium chloride    . cefUROXime (ZINACEF)  IV 1.5 g (05/05/20 2048)  . lactated ringers Stopped (05/04/20 1925)  . lactated ringers 20 mL/hr at 05/05/20 2000  . milrinone 0.3 mcg/kg/min (05/06/20 0002)   PRN Meds:.dextrose, fentaNYL (SUBLIMAZE) injection, labetalol, melatonin, ondansetron (ZOFRAN) IV, sodium chloride flush, sodium chloride flush, traMADol  General appearance: alert, cooperative and no distress Heart: regular rate and rhythm Lungs: clear to auscultation bilaterally Abdomen: soft, non-tender; bowel sounds normal; no masses,  no organomegaly Extremities: edema trace Wound: aquacel on sternotomy, EVH C/D/I  Lab Results: CBC: Recent Labs    05/05/20 1644 05/06/20 0531  WBC 10.8* 12.1*  HGB 9.4* 9.7*  HCT 28.1* 29.4*  PLT 88* 92*   BMET:  Recent Labs    05/05/20 1644 05/06/20 0531  NA 137 136  K 4.2 4.2  CL 109 107  CO2 22 23  GLUCOSE 244* 169*  BUN 27* 25*  CREATININE 1.51* 1.52*  CALCIUM 7.8* 8.0*    CMET: Lab Results  Component Value Date   WBC 12.1 (H) 05/06/2020   HGB 9.7 (L) 05/06/2020   HCT 29.4 (L) 05/06/2020   PLT 92 (L) 05/06/2020   GLUCOSE 169 (H) 05/06/2020   CHOL 189 06/24/2019   TRIG 292.0 (H) 06/24/2019   HDL 29.60 (L) 06/24/2019   LDLDIRECT 101.0 06/24/2019   LDLCALC 83 05/13/2008   ALT 15 05/02/2020   AST 21 05/02/2020   NA 136 05/06/2020   K 4.2 05/06/2020   CL 107 05/06/2020   CREATININE 1.52 (H) 05/06/2020   BUN 25 (H) 05/06/2020   CO2 23 05/06/2020   TSH 2.44 06/23/2019   INR 1.4 (H) 05/04/2020   HGBA1C 8.1 (H) 05/02/2020   MICROALBUR 25.9 (H) 11/20/2019      PT/INR:  Recent Labs     05/04/20 1544  LABPROT 17.6*  INR 1.4*   Radiology: No results found.   Assessment/Plan: S/P Procedure(s) (LRB): AORTIC VALVE REPLACEMENT (AVR) USING INSPIRIS 23MM RESILIA AORTIC VALVE (N/A) CORONARY ARTERY BYPASS GRAFTING (CABG), ON PUMP, TIMES TWO, USING LEFT INTERNAL MAMMARY ARTERY AND RIGHT ENDOSCOPICALLY HARVESTED GREATER SAPHENOUS VEIN (N/A) TRANSESOPHAGEAL ECHOCARDIOGRAM (TEE) (N/A)  1. CV- NSR, remains Hypertensive- on Milrinone at 0.3, home Cozaar restarted yesterday and titrated to full dose today 2. Pulm-  CTs dumped 200 cc this morning, left sided pneumothorax has nearly resolved, continue IS... discuss CT removal with Dr. Roxy Manns, may be beneficial for patient to walk and ensure no further dumping occurs 3. Renal-baseline CKD around 1.5-1.6- creatinine remains at 1.5 this morning, weight if accurate is elevated 20 lbs, minimal edema on exam, Lasix as ordered by Dr. Roxy Manns 4. Expected post operative blood loss anemia, Hgb at 9.7 stable 5. DM- cbgs mostly controlled, on SSIP.Marland Kitchen hold off on resumption of oral agents for now 6. Dispo- patient stable, remains in NSR, Cozaar titrated for additional BP support, CTs dumped out 200 cc this morning, patient hasn't walked yet, may benefit to ambulate patient to ensure not a large amount of pleural fluid, weight is elevated, diuretics per CHO, wean Milrinone, continue current care    Ellwood Handler, PA-C 05/06/2020 7:31 AM   I have seen and examined the patient and agree with the assessment and plan as outlined.  Looks good.  Still hypertensive.  Will increase Cozaar to pre-op dose and continue diuresis.  Mobilize.  Wean milrinone off slowly.  D/C tubes later today depending on output.  Possible transfer to 4E later today or tomorrow.  Rexene Alberts, MD 05/06/2020 8:14 AM

## 2020-05-07 ENCOUNTER — Inpatient Hospital Stay (HOSPITAL_COMMUNITY): Payer: Medicare Other

## 2020-05-07 LAB — BASIC METABOLIC PANEL
Anion gap: 7 (ref 5–15)
BUN: 28 mg/dL — ABNORMAL HIGH (ref 8–23)
CO2: 23 mmol/L (ref 22–32)
Calcium: 8.1 mg/dL — ABNORMAL LOW (ref 8.9–10.3)
Chloride: 105 mmol/L (ref 98–111)
Creatinine, Ser: 1.56 mg/dL — ABNORMAL HIGH (ref 0.44–1.00)
GFR, Estimated: 34 mL/min — ABNORMAL LOW (ref >60)
Glucose, Bld: 133 mg/dL — ABNORMAL HIGH (ref 70–99)
Potassium: 3.6 mmol/L (ref 3.5–5.1)
Sodium: 135 mmol/L (ref 135–145)

## 2020-05-07 LAB — CBC
HCT: 29.1 % — ABNORMAL LOW (ref 36.0–46.0)
Hemoglobin: 9.6 g/dL — ABNORMAL LOW (ref 12.0–15.0)
MCH: 30.8 pg (ref 26.0–34.0)
MCHC: 33 g/dL (ref 30.0–36.0)
MCV: 93.3 fL (ref 80.0–100.0)
Platelets: 113 10*3/uL — ABNORMAL LOW (ref 150–400)
RBC: 3.12 MIL/uL — ABNORMAL LOW (ref 3.87–5.11)
RDW: 16.1 % — ABNORMAL HIGH (ref 11.5–15.5)
WBC: 12.9 10*3/uL — ABNORMAL HIGH (ref 4.0–10.5)
nRBC: 0 % (ref 0.0–0.2)

## 2020-05-07 LAB — GLUCOSE, CAPILLARY
Glucose-Capillary: 249 mg/dL — ABNORMAL HIGH (ref 70–99)
Glucose-Capillary: 209 mg/dL — ABNORMAL HIGH (ref 70–99)
Glucose-Capillary: 294 mg/dL — ABNORMAL HIGH (ref 70–99)
Glucose-Capillary: 206 mg/dL — ABNORMAL HIGH (ref 70–99)

## 2020-05-07 MED ORDER — POTASSIUM CHLORIDE CRYS ER 20 MEQ PO TBCR
20.0000 meq | EXTENDED_RELEASE_TABLET | ORAL | Status: AC
  Administered 2020-05-07 (×3): 20 meq via ORAL
  Filled 2020-05-07 (×3): qty 1

## 2020-05-07 NOTE — Progress Notes (Signed)
Mobility Specialist: Progress Note   05/07/20 1143  Mobility  Activity Ambulated in hall  Level of Assistance Contact guard assist, steadying assist  Assistive Device Front wheel walker  Distance Ambulated (ft) 240 ft  Mobility Response Tolerated well  Mobility performed by Mobility specialist  Bed Position Chair  $Mobility charge 1 Mobility   Pre-Mobility: 79 HR, 94% SpO2 Post-Mobility: 85 HR, 155/63 BP, 97-100% SpO2  Pt c/o 5/10 chest pain and feeling a little SOB during ambulation. Pt stopped for one standing break due to SOB. Pt to chair after walk to sit up for lunch.   [facility] Chandra Feger Mobility Specialist Mobility Specialist Phone: [phone]

## 2020-05-07 NOTE — Progress Notes (Signed)
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 (N/A) CORONARY ARTERY BYPASS GRAFTING (CABG), ON PUMP, TIMES TWO, USING LEFT INTERNAL MAMMARY ARTERY AND RIGHT ENDOSCOPICALLY HARVESTED GREATER SAPHENOUS VEIN (N/A) TRANSESOPHAGEAL ECHOCARDIOGRAM (TEE) (N/A) Subjective: No complaints  Objective: Vital signs in last 24 hours: Temp:  [97.1 F (36.2 C)-98.1 F (36.7 C)] 97.7 F (36.5 C) (04/16 0500) Pulse Rate:  [73-104] 80 (04/16 0700) Cardiac Rhythm: Normal sinus rhythm;Ventricular paced (04/16 0400) Resp:  [21-28] 24 (04/16 0700) BP: (90-170)/(47-98) 157/61 (04/16 0700) SpO2:  [91 %-99 %] 98 % (04/16 0700) Arterial Line BP: (145-175)/(55-67) 167/62 (04/15 1000) Weight:  [73.1 kg] 73.1 kg (04/16 0500)  Hemodynamic parameters for last 24 hours:    Intake/Output from previous day: 04/15 0701 - 04/16 0700 In: 822.6 [P.O.:120; I.V.:502.6; IV Piggyback:200] Out: 4726 [Urine:4425; Stool:1; Chest Tube:300] Intake/Output this shift: No intake/output data recorded.  General appearance: alert and cooperative Neurologic: intact Heart: regular rate and rhythm, S1, S2 normal, no murmur, click, rub or gallop Lungs: diminished breath sounds bibasilar Abdomen: soft, non-tender; bowel sounds normal; no masses,  no organomegaly Extremities: extremities normal, atraumatic, no cyanosis or edema Wound: c/d/i  Lab Results: Recent Labs    05/06/20 0531 05/07/20 0034  WBC 12.1* 12.9*  HGB 9.7* 9.6*  HCT 29.4* 29.1*  PLT 92* 113*   BMET:  Recent Labs    05/06/20 0531 05/07/20 0034  NA 136 135  K 4.2 3.6  CL 107 105  CO2 23 23  GLUCOSE 169* 133*  BUN 25* 28*  CREATININE 1.52* 1.56*  CALCIUM 8.0* 8.1*    PT/INR:  Recent Labs    05/04/20 1544  LABPROT 17.6*  INR 1.4*   ABG    Component Value Date/Time   PHART 7.329 (L) 05/04/2020 2226   HCO3 22.9 05/04/2020 2226   TCO2 24 05/04/2020 2226   ACIDBASEDEF 3.0 (H)  05/04/2020 2226   O2SAT 94.0 05/04/2020 2226   CBG (last 3)  Recent Labs    05/06/20 1503 05/06/20 1944 05/07/20 0802  GLUCAP 188* 248* 249*    Assessment/Plan: S/P Procedure(s) (LRB): 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

## 2020-05-08 LAB — GLUCOSE, CAPILLARY
Glucose-Capillary: 254 mg/dL — ABNORMAL HIGH (ref 70–99)
Glucose-Capillary: 174 mg/dL — ABNORMAL HIGH (ref 70–99)
Glucose-Capillary: 375 mg/dL — ABNORMAL HIGH (ref 70–99)
Glucose-Capillary: 304 mg/dL — ABNORMAL HIGH (ref 70–99)

## 2020-05-08 MED ORDER — INSULIN DETEMIR 100 UNIT/ML ~~LOC~~ SOLN
10.0000 [IU] | Freq: Every day | SUBCUTANEOUS | Status: DC
  Administered 2020-05-08: 10 [IU] via SUBCUTANEOUS
  Filled 2020-05-08 (×2): qty 0.1

## 2020-05-08 NOTE — Progress Notes (Signed)
EPW pulled per MD order. Pt tolerated activity well and is resting in bed x 1 hour.

## 2020-05-08 NOTE — Progress Notes (Signed)
Mobility Specialist: Progress Note   05/08/20 1143  Mobility  Activity Ambulated in hall  Level of Assistance Modified independent, requires aide device or extra time  Assistive Device Front wheel walker  Distance Ambulated (ft) 240 ft  Mobility Response Tolerated well  Mobility performed by Mobility specialist  $Mobility charge 1 Mobility   Pre-Mobility: 79 HR, 160/68 BP, 100% SpO2 Post-Mobility: 93 HR, 173/65 BP, 97% SpO2  Pt c/o legs feeling weak and getting SOB after 155ft and needed to head back to the room. Pt to bed after walk with call bell at her side.   [facility] Mobility Specialist Mobility Specialist Phone: [phone]

## 2020-05-08 NOTE — Progress Notes (Addendum)
      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   Objective: Vital signs in last 24 hours: Temp:  [97.5 F (36.4 C)-98.3 F (36.8 C)] 97.5 F (36.4 C) (04/16 1947) Pulse Rate:  [81-86] 84 (04/16 1947) Cardiac Rhythm: Normal sinus rhythm;Bundle branch block (04/16 1900) Resp:  [18-26] 18 (04/16 1947) BP: (138-155)/(63-105) 138/105 (04/16 1947) SpO2:  [93 %-99 %] 94 % (04/16 1947)     Intake/Output from previous day: No intake/output data recorded. Intake/Output this shift: No intake/output data recorded.  General appearance: alert, cooperative and no distress Heart: regular rate and rhythm, S1, S2 normal, no murmur, click, rub or gallop Lungs: clear to auscultation bilaterally Abdomen: soft, non-tender; bowel sounds normal; no masses,  no organomegaly Extremities: extremities normal, atraumatic, no cyanosis or edema Wound: clean and dry  Lab Results: Recent Labs    05/06/20 0531 05/07/20 0034  WBC 12.1* 12.9*  HGB 9.7* 9.6*  HCT 29.4* 29.1*  PLT 92* 113*   BMET:  Recent Labs    05/06/20 0531 05/07/20 0034  NA 136 135  K 4.2 3.6  CL 107 105  CO2 23 23  GLUCOSE 169* 133*  BUN 25* 28*  CREATININE 1.52* 1.56*  CALCIUM 8.0* 8.1*    PT/INR: No results for input(s): LABPROT, INR in the last 72 hours. 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 CV- NSR, remains Hypertensive at times, on home dose of Cozaar 2. Pulm-  On room air with good oxygen saturation. CXR stable yesterday  3. Renal-baseline CKD around 1.5-1.6- creatinine remains at 1.56 this morning, weight if accurate is elevated 6k, minimal edema on exam, continue lasix IV one more day then switch to PO.  4. Expected post operative blood loss anemia, 9.6/29.1, stable 5. DM- cbgs >200, will add long-acting insulin, cont. SSI.Marland Kitchen she is on 4 oral agents at home.  Plan: Remove EPW, encourage ambulation and use of incentive spirometer. Continue diuretics for fluid overload.    LOS: 4 days    Elgie Collard 05/08/2020 Pt seen and examined; agree with documentation. Kaelen Brennan Z. Orvan Seen, [place]

## 2020-05-09 ENCOUNTER — Inpatient Hospital Stay (HOSPITAL_COMMUNITY): Payer: Medicare Other

## 2020-05-09 ENCOUNTER — Other Ambulatory Visit: Payer: Self-pay | Admitting: Physician Assistant

## 2020-05-09 LAB — CBC
HCT: 33.8 % — ABNORMAL LOW (ref 36.0–46.0)
Hemoglobin: 10.8 g/dL — ABNORMAL LOW (ref 12.0–15.0)
MCH: 30.3 pg (ref 26.0–34.0)
MCHC: 32 g/dL (ref 30.0–36.0)
MCV: 94.7 fL (ref 80.0–100.0)
Platelets: 221 10*3/uL (ref 150–400)
RBC: 3.57 MIL/uL — ABNORMAL LOW (ref 3.87–5.11)
RDW: 15.5 % (ref 11.5–15.5)
WBC: 12.5 10*3/uL — ABNORMAL HIGH (ref 4.0–10.5)
nRBC: 0 % (ref 0.0–0.2)

## 2020-05-09 LAB — BASIC METABOLIC PANEL
Anion gap: 8 (ref 5–15)
BUN: 33 mg/dL — ABNORMAL HIGH (ref 8–23)
CO2: 29 mmol/L (ref 22–32)
Calcium: 8.8 mg/dL — ABNORMAL LOW (ref 8.9–10.3)
Chloride: 101 mmol/L (ref 98–111)
Creatinine, Ser: 1.64 mg/dL — ABNORMAL HIGH (ref 0.44–1.00)
GFR, Estimated: 32 mL/min — ABNORMAL LOW (ref >60)
Glucose, Bld: 69 mg/dL — ABNORMAL LOW (ref 70–99)
Potassium: 3.7 mmol/L (ref 3.5–5.1)
Sodium: 138 mmol/L (ref 135–145)

## 2020-05-09 LAB — GLUCOSE, CAPILLARY
Glucose-Capillary: 101 mg/dL — ABNORMAL HIGH (ref 70–99)
Glucose-Capillary: 151 mg/dL — ABNORMAL HIGH (ref 70–99)

## 2020-05-09 MED ORDER — FUROSEMIDE 40 MG PO TABS
40.0000 mg | ORAL_TABLET | Freq: Every day | ORAL | Status: DC
  Administered 2020-05-09: 40 mg via ORAL
  Filled 2020-05-09: qty 1

## 2020-05-09 MED ORDER — METOPROLOL TARTRATE 25 MG PO TABS: 25.0000 mg | ORAL_TABLET | Freq: Two times a day (BID) | ORAL | 1 refills | Status: DC

## 2020-05-09 MED ORDER — INSULIN DETEMIR 100 UNIT/ML ~~LOC~~ SOLN
13.0000 [IU] | Freq: Every day | SUBCUTANEOUS | Status: DC
  Administered 2020-05-09: 13 [IU] via SUBCUTANEOUS
  Filled 2020-05-09: qty 0.13

## 2020-05-09 MED ORDER — LOSARTAN POTASSIUM 50 MG PO TABS
100.0000 mg | ORAL_TABLET | Freq: Every day | ORAL | Status: DC
  Administered 2020-05-09: 100 mg via ORAL
  Filled 2020-05-09: qty 2

## 2020-05-09 MED ORDER — TRAMADOL HCL 50 MG PO TABS: 50.0000 mg | ORAL_TABLET | Freq: Four times a day (QID) | ORAL | 0 refills | Status: DC | PRN

## 2020-05-09 MED ORDER — FUROSEMIDE 40 MG PO TABS: 40.0000 mg | ORAL_TABLET | Freq: Every day | ORAL | 0 refills | Status: DC

## 2020-05-09 MED ORDER — LOSARTAN POTASSIUM 50 MG PO TABS: 100.0000 mg | ORAL_TABLET | Freq: Every day | ORAL | 1 refills | Status: DC

## 2020-05-09 MED ORDER — POTASSIUM CHLORIDE CRYS ER 20 MEQ PO TBCR: 20.0000 meq | EXTENDED_RELEASE_TABLET | Freq: Every day | ORAL | 0 refills | Status: DC

## 2020-05-09 MED FILL — Heparin Sodium (Porcine) Inj 1000 Unit/ML: INTRAMUSCULAR | Qty: 10 | Status: AC

## 2020-05-09 MED FILL — Electrolyte-R (PH 7.4) Solution: INTRAVENOUS | Qty: 6000 | Status: AC

## 2020-05-09 MED FILL — Sodium Bicarbonate IV Soln 8.4%: INTRAVENOUS | Qty: 50 | Status: AC

## 2020-05-09 MED FILL — Sodium Chloride IV Soln 0.9%: INTRAVENOUS | Qty: 3000 | Status: AC

## 2020-05-09 NOTE — Progress Notes (Signed)
CARDIAC REHAB PHASE I   Offered to walk with pt. Pt anxiously awaiting d/c. D/c instructions completed with pt. Pt educated on importance of site care and monitoring incisions daily. Encouraged continued IS use, walks, and sternal precautions. Pt given in-the-tube sheet along with heart healthy and diabetic diets. Reviewed restrictions and exercise guidelines. Will refer to CRP II [place]. Pt denies further questions or concerns at this time, and states she has needed DME at home.   [id number] Rufina Falco, RN BSN 05/09/2020 10:20 AM

## 2020-05-09 NOTE — Plan of Care (Signed)
  Problem: Clinical Measurements: Goal: Will remain free from infection Outcome: Progressing Goal: Cardiovascular complication will be avoided Outcome: Progressing   Problem: Activity: Goal: Risk for activity intolerance will decrease Outcome: Progressing   

## 2020-05-09 NOTE — Progress Notes (Incomplete)
Inpatient Diabetes Program Recommendations  AACE/ADA: New Consensus Statement on Inpatient Glycemic Control (2015)  Target Ranges:  Prepandial:   less than 140 mg/dL      Peak postprandial:   less than 180 mg/dL (1-2 hours)      Critically ill patients:  140 - 180 mg/dL   Lab Results  Component Value Date   GLUCAP 151 (H) 05/09/2020   HGBA1C 8.1 (H) 05/02/2020    Review of Glycemic Control Results for [name], [name] (MRN [id number]) as of 05/09/2020 09:24  Ref. Range 05/08/2020 06:05 05/08/2020 11:12 05/08/2020 16:38 05/08/2020 20:00 05/09/2020 04:32 05/09/2020 06:43  Glucose-Capillary Latest Ref Range: 70 - 99 mg/dL 254 (H) 174 (H) 375 (H) Novolog 20 units 304 (H) Novolog 16 units 101 (H) 151 (H)   Diabetes history: DM2 Outpatient Diabetes medications:  Glipizide XL 10 QD Metformin 500 mg QAM Actos 30 mg QHS Januvia 100 mg QD Current orders for Inpatient glycemic control:  Levemir 13 units daily (increased today from 10 units) Novolog 0-24 units TID and HS  Inpatient Diabetes Program Recommendations:    Might consider Novolog 3 units if post prandials CBG's remain elevated.  Noted Lantus was increased today.    Will continue to follow while inpatient.  Thank you, Reche Dixon, RN, BSN Diabetes Coordinator Inpatient Diabetes Program [phone] (team pager from 8a-5p)

## 2020-05-09 NOTE — Care Management Important Message (Signed)
Important Message  Patient Details  Name: [name] MRN: [id number] Date of Birth: 07/16/1940   Medicare Important Message Given:  Yes     Shelda Altes 05/09/2020, 10:12 AM

## 2020-05-09 NOTE — Progress Notes (Addendum)
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   Objective: Vital signs in last 24 hours: Temp:  [97.5 F (36.4 C)-97.8 F (36.6 C)] 97.5 F (36.4 C) (04/18 0434) Pulse Rate:  [77-99] 81 (04/18 0434) Cardiac Rhythm: Normal sinus rhythm (04/17 1956) Resp:  [19-20] 19 (04/18 0434) BP: (154-191)/(60-68) 161/68 (04/18 0434) SpO2:  [96 %-100 %] 96 % (04/18 0434) Weight:  [68.7 kg-70.3 kg] 68.7 kg (04/18 0646)     Intake/Output from previous day: 04/17 0701 - 04/18 0700 In: 600 [P.O.:600] Out: -  Intake/Output this shift: No intake/output data recorded.  General appearance: alert, cooperative and no distress Heart: regular rate and rhythm, S1, S2 normal, no murmur, click, rub or gallop Lungs: clear to auscultation bilaterally Abdomen: soft, non-tender; bowel sounds normal; no masses,  no organomegaly Extremities: extremities normal, atraumatic, no cyanosis or edema Wound: clean and dry  Lab Results: Recent Labs    05/07/20 0034 05/09/20 0225  WBC 12.9* 12.5*  HGB 9.6* 10.8*  HCT 29.1* 33.8*  PLT 113* 221   BMET:  Recent Labs    05/07/20 0034 05/09/20 0225  NA 135 138  K 3.6 3.7  CL 105 101  CO2 23 29  GLUCOSE 133* 69*  BUN 28* 33*  CREATININE 1.56* 1.64*  CALCIUM 8.1* 8.8*    PT/INR: No results for input(s): LABPROT, INR in the last 72 hours. 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 CV- NSR, remains Hypertensive at times, increase home dose of Cozaar to 100mg  daily. 2. Pulm- On room air with good oxygen saturation. CXR stable yesterday  3. Renal-baseline CKD around 1.5-1.6- creatinine remains at 1.64 this morning, weight if accurate is elevated 1k, minimal edema on exam, will switch to 40mg  daily of lasix.  4. Expected post operative blood loss anemia, 9.6/29.1, stable 5. DM- cbgs >200, will increase long acting to 13 units, cont. SSI.Marland Kitchen she is on 4 oral agents at home. She will need follow-up with her diabetes doctor at discharge.   Plan: BP not well controlled, increased her cozaar for better control. Decreased her diuretics today. Her blood glucose is still poorly controlled. Will plan to restart all her oral agents on discharge. I asked her to hold her Actos if her sugars are running low.    LOS: 5 days    Elgie Collard 05/09/2020  I have seen and examined the patient and agree with the assessment and plan as outlined.  Valentina Gu  Roxy Manns, MD 05/09/2020 8:18 AM

## 2020-05-10 ENCOUNTER — Encounter: Payer: Self-pay | Admitting: Nurse Practitioner

## 2020-05-10 ENCOUNTER — Encounter: Payer: Medicare Other | Admitting: Nurse Practitioner

## 2020-05-10 ENCOUNTER — Telehealth: Payer: Self-pay

## 2020-05-10 NOTE — Telephone Encounter (Signed)
Attempted TCM call. Patient states she does not need to see PCP for a hospital follow up. She will be seeing cardiology.

## 2020-05-11 NOTE — Progress Notes (Signed)
She declined visit This encounter was created in error - please disregard.

## 2020-05-12 ENCOUNTER — Ambulatory Visit: Payer: Medicare Other | Admitting: Cardiovascular Disease

## 2020-05-12 ENCOUNTER — Telehealth (HOSPITAL_COMMUNITY): Payer: Self-pay

## 2020-05-12 NOTE — Telephone Encounter (Signed)
Called and spoke with pt in regards to CR, pt stated she is not interested at this time. Will be doing a program that comes to her.  Closed referral

## 2020-05-12 NOTE — Telephone Encounter (Signed)
Pt insurance is active and benefits verified through [facility]. Co-pay $0.00, DED $200.00/$200.00 met, out of pocket $2,200.00/$200.00 met, co-insurance 0%. No pre-authorization required. Passport, 05/12/20 @ 10:03AM, ELY#[id number]  Will contact patient to see if she is interested in the Cardiac Rehab Program. If interested, patient will need to complete follow up appt. Once completed, patient will be contacted for scheduling upon review by the RN Navigator.

## 2020-05-17 ENCOUNTER — Telehealth: Payer: Self-pay

## 2020-05-17 NOTE — Telephone Encounter (Signed)
Patient contacted the office and stated that she has "stopped urinating". She stated that she has been "dribbling" but no steady stream.  She is s/p AVReplacement with Dr. Roxy Manns 05/04/2020.  She is currently taking lasix and has been drinking fluids. She feels that her bladder is not full and she does not have any history of urinary retention.  She does not see a Dealer.  Advised her to contact her PCP for appointment and advice.  She acknowledged receipt.

## 2020-05-18 ENCOUNTER — Ambulatory Visit: Payer: Medicare Other | Admitting: Family Medicine

## 2020-05-20 ENCOUNTER — Other Ambulatory Visit: Payer: Self-pay

## 2020-05-20 MED ORDER — VALACYCLOVIR HCL 1 G PO TABS: 1000.0000 mg | ORAL_TABLET | Freq: Every day | ORAL | 0 refills | Status: AC | PRN

## 2020-05-20 NOTE — Telephone Encounter (Signed)
Last OV 04/22/2020 w/[facility] Last fill 04/22/2017 #180/0

## 2020-05-23 ENCOUNTER — Telehealth: Payer: Self-pay

## 2020-05-23 NOTE — Telephone Encounter (Signed)
Patient contacted the office and left a message regarding burning around her sternal incision. She is s/p AVReplacement/ CABG with Dr. Roxy Manns 05/04/20 and was requesting what she could place on the areas.  Attempted to contact patient with busy signal, unable to leave message.  Will contact patient back.

## 2020-05-24 ENCOUNTER — Inpatient Hospital Stay (HOSPITAL_COMMUNITY): Admission: RE | Admit: 2020-05-24 | Payer: Medicare Other | Source: Ambulatory Visit

## 2020-05-27 ENCOUNTER — Other Ambulatory Visit: Payer: Self-pay | Admitting: Thoracic Surgery (Cardiothoracic Vascular Surgery)

## 2020-05-27 DIAGNOSIS — Z953 Presence of xenogenic heart valve: Secondary | ICD-10-CM

## 2020-05-29 NOTE — Progress Notes (Addendum)
Cardiology Office Note:    Date:  05/30/2020   ID:  [name], DOB 1940/02/10, MRN [id number]  PCP:  Ronnald Nian, DO   [facility] Providers Cardiologist:  [name], MD     Referring MD: Ronnald Nian, DO   Chief Complaint:  Hospitalization Follow-up (S/p CABG + AVR)    Patient Profile:    [name] is a 80 y.o. female with:   Coronary artery disease   Aortic stenosis  S/p DES to RCA in 2007  Myoview 2017 low risk   S/p CABG + AVR 4/22  Hypertension   Diabetes mellitus   Chronic kidney disease   Hyperlipidemia   Breast CA   Peripheral arterial disease   S/p R SFA stent  GERD  Hypothyroidism   Carotid artery disease   S/p carotid stent in 2007  [place] 4/22: [place] 1-39  Prior CV studies: Pre-CABG Dopplers 05/02/20 Bilateral ICA 1-39   RIGHT/LEFT HEART CATH AND CORONARY ANGIOGRAPHY 03/17/2020 Narrative  Ost RCA to Prox RCA lesion is 80% stenosed.  Mid RCA lesion is 30% stenosed.  RPDA lesion is 60% stenosed.  1st Mrg lesion is 50% stenosed.  Prox Cx to Mid Cx lesion is 80% stenosed.  Mid LAD lesion is 80% stenosed. 1. No significant disease in the left main artery 2. Severe mid LAD stenosis. The LAD bifurcated distally beyond this lesion and the diagonal branch reaches the apex. 3. Moderate caliber non-dominant Circumflex with severe mid stenosis. The vessel is relatively small beyond the stenosis. The small to moderate caliber obtuse marginal branch arises prior to the stenosis and has moderate ostial stenosis. 4. The RCA is a large dominant artery. There is severe, heavily calcified stenosis at the ostium of the RCA. The distal stented segment is patent. There is moderately severe stenosis in the moderate caliber PDA. 5. Severe aortic stenosis (mean gradient 26.9 mmHg, peak gradient 34 mmHg)  Echocardiogram 02/29/20 Moderate-severe aortic stenosis, V-max 3.5 m/s, mean gradient 33 mmHg, DI 0.26, moderate MR, EF 65-70,  no RWMA, moderate LVH, normal RVSF  GATED SPECT MYO PERF W/EXERCISE STRESS 1D 11/22/2015 Narrative  Nuclear stress EF: 59%. Normal wall motion  There was no ST segment deviation noted during stress.  This is a low risk study. No perfusion defects, no ischemia   History of Present Illness: [name] was recently noted to have symptomatic severe aortic stenosis.  Cardiac catheterization demonstrated severe disease in the LAD and RCA.  There was also severe disease in a very small LCx.  She was referred to Dr. Roxy Manns for CABG+AVR. She was admitted 4/13-4/18 and underwent CABG (L-LAD, S-PDA) + bioprosthetic AVR.  The post op course was fairly uneventful.  She returns for f/u.  She is here alone. She is doing very well since DC.  She is walking several minutes a day when the weather is warmer.  She is still sleeping in a recliner.  She has not had shortness of breath, syncope, leg edema. She has a good appetite.  She is not interested in going to cardiac rehabilitation.          Past Medical History:  Diagnosis Date  . Allergy   . Anemia    childhood  . Anxiety   . Aortic stenosis   . Arterial stenosis (HCC)    mesenteric  . Arthritis   . Breast cancer ([place]) 12/05/10   R breast, inv mammary, in situ,ER/PR +,HER2 -  . Cancer ([place])   .  Carcinoma of breast treated with adjuvant chemotherapy ([place])   . 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[place])   . Claudication ([place])   . Coronary artery disease    stent 2007  . Depression   . Diabetes mellitus   . Difficulty sleeping   . Diverticulosis 12/10/03  . Elevated cholesterol   . Generalized weakness   . GERD (gastroesophageal reflux disease)   . Heart murmur   . Hepatitis    as an infant  . HSV-1 (herpes simplex virus 1) infection   . Hyperlipidemia   . Hypertension   . Hypothyroidism   . Osteoarthritis   . PAD (peripheral artery disease) ([place])   . Personal history of chemotherapy   . Personal history of radiation therapy   .  Pneumonia    2014 september and March 2022  . Rheumatoid arthritis ([place])   . S/P aortic valve replacement with bioprosthetic valve 05/04/2020   Edwards Inspiris Resilia stented bovine pericardial tissue valve, size 23 mm  . 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 Spinal stenosis   . Thyroid disease     Current Medications: Current Meds  Medication Sig  . Accu-Chek FastClix Lancets MISC USE TO CHECK BLOOD SUGARS 3 TIMES DAILY  . acetaminophen (TYLENOL) 500 MG tablet Take 1,000 mg by mouth every 6 (six) hours as needed for moderate pain or headache.  Marland Kitchen aspirin EC 81 MG tablet Take 81 mg by mouth at bedtime.  . calcium carbonate (TUMS - DOSED IN MG ELEMENTAL CALCIUM) 500 MG chewable tablet Chew 2 tablets by mouth 3 (three) times daily as needed for indigestion or heartburn.  . Cholecalciferol (VITAMIN D3) 25 MCG (1000 UT) CAPS Take 1,000 Units by mouth daily before lunch.  . clobetasol cream (TEMOVATE) 6.26 % Apply 1 application topically 2 (two) times daily.  . furosemide (LASIX) 40 MG tablet Take 40 mg by mouth daily.  Marland Kitchen glipiZIDE (GLUCOTROL XL) 10 MG 24 hr tablet Take 1 tablet (10 mg total) by mouth daily at 12 noon.  Marland Kitchen glucose blood (ACCU-CHEK GUIDE) test strip TEST ONCE DAILY  . levothyroxine (SYNTHROID) 88 MCG tablet Take 1 tablet (88 mcg total) by mouth daily.  Marland Kitchen losartan (COZAAR) 50 MG tablet Take 2 tablets (100 mg total) by mouth daily.  . Magnesium 250 MG TABS Take 250 mg by mouth at bedtime.  . melatonin 3 MG TABS tablet Take 3 mg by mouth at bedtime.  . metFORMIN (GLUCOPHAGE) 500 MG tablet Take 1 tablet (500 mg total) by mouth daily with breakfast.  . metoprolol tartrate (LOPRESSOR) 25 MG tablet Take 1 tablet (25 mg total) by mouth 2 (two) times daily.  . Omega-3 Fatty Acids (FISH OIL) 1200 MG CAPS Take 1,200 mg by mouth in the morning and at bedtime.  . pantoprazole (PROTONIX) 40 MG tablet  Take 1 tablet (40 mg total) by mouth daily.  Marland Kitchen PARoxetine (PAXIL) 20 MG tablet Take 1 tablet (20 mg total) by mouth in the morning.  . pioglitazone (ACTOS) 30 MG tablet Take 1 tablet (30 mg total) by mouth daily.  . potassium chloride SA (KLOR-CON) 20 MEQ tablet Take 1 tablet (20 mEq total) by mouth daily.  . sennosides-docusate sodium (SENOKOT-S) 8.6-50 MG tablet Take 1 tablet by mouth daily.  . sitaGLIPtin (JANUVIA) 100 MG tablet Take 1 tablet (100 mg total)  by mouth daily.  . valACYclovir (VALTREX) 1000 MG tablet Take 1 tablet (1,000 mg total) by mouth daily as needed (Fever Blisters).  . vitamin B-12 (CYANOCOBALAMIN) 500 MCG tablet Take 1,000 mcg by mouth daily with lunch.     Allergies:   Codeine; Antihistamines, diphenhydramine-type; Gabapentin; Statins; Sulfa antibiotics; and Erythromycin   Social History   Tobacco Use  . Smoking status: Former Smoker    Packs/day: 2.00    Years: 40.00    Pack years: 80.00    Quit date: 12/19/1997    Years since quitting: 22.4  . Smokeless tobacco: Never Used  Vaping Use  . Vaping Use: Never used  Substance Use Topics  . Alcohol use: Yes    Alcohol/week: 1.0 standard drink    Types: 1 Glasses of wine per week    Comment: occasiona - maybe once a month  . Drug use: No     Family Hx: The patient's family history includes Alcoholism in her father; Cancer in her son; Diabetes in her brother; Heart attack in her brother and father; Heart disease in her mother; Hypertension in her brother; Lymphoma (age of onset: 26) in her brother; Throat cancer in her father. There is no history of Colon cancer, Stroke, Esophageal cancer, Rectal cancer, or Stomach cancer.  Review of Systems  Constitutional: Negative for decreased appetite and fever.  Respiratory: Negative for cough.      EKGs/Labs/Other Test Reviewed:    EKG:  EKG is   ordered today.  The ekg ordered today demonstrates NSR, HR 75, normal axis, T wave inversions 2, 3, aVF, V5 5-V6, QTC  471, LVH, no change when compared to prior tracing  Recent Labs: 06/23/2019: TSH 2.44 03/04/2020: NT-Pro BNP 897 05/02/2020: ALT 15 05/05/2020: Magnesium 2.2 05/09/2020: BUN 33; Creatinine, Ser 1.64; Hemoglobin 10.8; Platelets 221; Potassium 3.7; Sodium 138   Recent Lipid Panel Lab Results  Component Value Date/Time   CHOL 189 06/24/2019 02:16 PM   TRIG 292.0 (H) 06/24/2019 02:16 PM   HDL 29.60 (L) 06/24/2019 02:16 PM   CHOLHDL 6 06/24/2019 02:16 PM   LDLCALC 83 05/13/2008 09:29 AM   LDLDIRECT 101.0 06/24/2019 02:16 PM      Risk Assessment/Calculations:      Physical Exam:    VS:  BP 140/60   Pulse 75   Ht '5\' 3"'  (1.6 m)   Wt 147 lb (66.7 kg)   SpO2 96%   BMI 26.04 kg/m     Wt Readings from Last 3 Encounters:  05/30/20 147 lb (66.7 kg)  05/30/20 147 lb (66.7 kg)  05/10/20 151 lb (68.5 kg)     Constitutional:      Appearance: Healthy appearance. Not in distress.  Neck:     Vascular: JVD normal.  Pulmonary:     Effort: Pulmonary effort is normal.     Breath sounds: No wheezing. No rales.  Cardiovascular:     Normal rate. Regular rhythm. Normal S1. Normal S2.     Murmurs: There is no murmur.     Comments: Median sternotomy well healed w/o erythema or d/c Edema:    Peripheral edema absent.  Abdominal:     Palpations: Abdomen is soft. There is no hepatomegaly.  Skin:    General: Skin is warm and dry.  Neurological:     Mental Status: Alert and oriented to person, place and time.     Cranial Nerves: Cranial nerves are intact.         ASSESSMENT & PLAN:  1. Coronary artery disease involving native coronary artery of native heart without angina pectoris 2. Severe aortic stenosis 3. S/P aortic valve replacement with bioprosthetic valve + CABG x2 She is progressing well since her surgery. She sees Dr. Roxy Manns later today.  She is not interested in cardiac rehabilitation.  I have encouraged her to continue to increase her activity on her own and let us know if she  changes her mind.  She has an echocardiogram scheduled for later this month.  She understands she needs SBE prophylaxis.  Continue ASA (of note she is only on 81 mg once daily - I asked her to check with Dr. Roxy Manns today to see if he prefers 325 mg for 3 mos).  F/u with Dr. Johnsie Cancel in 3 mos.  4. 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HCC) Creatinine has remained fairly stable.  5. Essential hypertension Blood pressure above target.  I asked her to check her blood pressure over the next 2 weeks and send readings in for review.  If her blood pressure remains above target, consider amlodipine versus increasing metoprolol tartrate.  6. Mixed hyperlipidemia She is intolerant of statins and ezetimibe.  Refer to Pharm.D. lipid clinic to consider PCSK9 inhibitor therapy.  Goal LDL <70.     Dispo:  Return in about 3 months (around 08/30/2020) for Routine Follow Up with Dr. Johnsie Cancel.   Medication Adjustments/Labs and Tests Ordered: Current medicines are reviewed at length with the patient today.  Concerns regarding medicines are outlined above.  Tests Ordered: Orders Placed This Encounter  Procedures  . AMB Referral to [facility] Pharm[facility]  . EKG 12-Lead   Medication Changes: No orders of the defined types were placed in this encounter.   Signed, Richardson Dopp, PA-C  05/30/2020 4:49 PM    [facility] [address], [place], [place]  [address] Phone: [phone]; Fax: [phone]

## 2020-05-30 ENCOUNTER — Ambulatory Visit (INDEPENDENT_AMBULATORY_CARE_PROVIDER_SITE_OTHER): Payer: Self-pay | Admitting: Surgical

## 2020-05-30 ENCOUNTER — Encounter: Payer: Self-pay | Admitting: Physician Assistant

## 2020-05-30 ENCOUNTER — Ambulatory Visit (INDEPENDENT_AMBULATORY_CARE_PROVIDER_SITE_OTHER): Payer: Medicare Other | Admitting: Physician Assistant

## 2020-05-30 ENCOUNTER — Other Ambulatory Visit: Payer: Self-pay

## 2020-05-30 ENCOUNTER — Ambulatory Visit
Admission: RE | Admit: 2020-05-30 | Discharge: 2020-05-30 | Disposition: A | Discharge status: Home / Self Care | Payer: Medicare Other | Source: Ambulatory Visit | Attending: Thoracic Surgery (Cardiothoracic Vascular Surgery) | Admitting: Thoracic Surgery (Cardiothoracic Vascular Surgery)

## 2020-05-30 VITALS — BP 159/70 | HR 88 | Temp 97.9°F | Resp 20 | Wt 147.0 lb

## 2020-05-30 VITALS — BP 140/60 | HR 75 | Ht 63.0 in | Wt 147.0 lb

## 2020-05-30 DIAGNOSIS — I2510 Atherosclerotic heart disease of native coronary artery without angina pectoris: Secondary | ICD-10-CM

## 2020-05-30 DIAGNOSIS — I10 Essential (primary) hypertension: Secondary | ICD-10-CM

## 2020-05-30 DIAGNOSIS — Z953 Presence of xenogenic heart valve: Secondary | ICD-10-CM

## 2020-05-30 DIAGNOSIS — E782 Mixed hyperlipidemia: Secondary | ICD-10-CM

## 2020-05-30 DIAGNOSIS — N184 Chronic kidney disease, stage 4 (severe): Secondary | ICD-10-CM

## 2020-05-30 DIAGNOSIS — E1122 Type 2 diabetes mellitus with diabetic chronic kidney disease: Secondary | ICD-10-CM

## 2020-05-30 DIAGNOSIS — I350 Nonrheumatic aortic (valve) stenosis: Secondary | ICD-10-CM

## 2020-05-30 NOTE — Patient Instructions (Signed)
Discussed activity progression, routine

## 2020-05-30 NOTE — Patient Instructions (Signed)
Medication Instructions:  Your physician recommends that you continue on your current medications as directed. Please refer to the Current Medication list given to you today.  *If you need a refill on your cardiac medications before your next appointment, please call your pharmacy*   Lab Work: None If you have labs (blood work) drawn today and your tests are completely normal, you will receive your results only by: Marland Kitchen MyChart Message (if you have MyChart) OR . A paper copy in the mail If you have any lab test that is abnormal or we need to change your treatment, we will call you to review the results.   Testing/Procedures: None   Follow-Up: Follow up in the Lipid Clinic on 06/16/2020 at 10:00 AM  Follow up with Dr. Johnsie Cancel on 10/19/2020 at 8:30 AM   Other Instructions Ask Dr. Roxy Manns if you should take Aspirin 325 mg daily for 3 months after your CABG.  Your physician has requested that you regularly monitor and record your blood pressure readings at home for 2 weeks. Please use the same machine at the same time of day to check your readings and record them and call or send in your readings.

## 2020-05-30 NOTE — Assessment & Plan Note (Signed)
Doing well post bovine bioprosthetic

## 2020-05-30 NOTE — Progress Notes (Signed)
[address]       [place] [address]             [phone]      Corita W Kazee [place] Medical Record #[id number] Date of Birth: 01/03/1941  Referring: Josue Hector, MD Primary Care: Ronnald Nian, DO Primary Cardiologist: [name], MD   Chief Complaint:   POST OP FOLLOW UP CARDIOTHORACIC SURGERY OPERATIVE NOTE  Date of Procedure:                05/04/2020  Preoperative Diagnosis:       ? Severe Aortic Stenosis ? Severe Multi-vessel Coronary Artery Disease  Postoperative Diagnosis:    Same  Procedure:        Aortic Valve Replacement             Edwards Inspiris Resilia Stented Bovine Pericardial Tissue Valve (size 23 mm, ref # [id number], serial # [id number])   Coronary Artery Bypass Grafting x 2             Left Internal Mammary Artery to Third Diagonal Branch of Distal Left Anterior Descending Coronary Artery             Saphenous Vein Graft to Posterior Descending Coronary Artery             Endoscopic Vein Harvest from Right Thigh   Surgeon:        Valentina Gu. Roxy Manns, MD  Assistant:       Nicholes Rough, PA-C  Anesthesia:    Nolon Nations, MD  Operative Findings: ? Sievers type I bicuspid aortic valve  ? Severe aortic stenosis ? Normal left ventricular systolic function ? Moderate mitral regurgitation ? Good quality left internal mammary artery conduit ? Good quality saphenous vein conduit ? Good quality target vessels for grafting  History of Present Illness:    Patient is a 80 year old female seen in the office on today's date and routine postsurgical follow-up status post the above described procedure.  Her only significant complaint at this time is some burning type sensation associated with the sternotomy incision.  It is relatively mild in nature but does affect her ability to sleep some.  She currently sleeps in a recliner.  She denies shortness of breath or chest pain.  He has had no difficulties with her  incisions.  She denies palpitations.  She denies peripheral edema.  Her ambulation is steadily improving over time.  Her strength is also improving.  She has been seen by cardiology and they are following her blood pressure which has been elevated.  She is keeping records and they will adjust medications.  She is scheduled for echocardiogram in the near future as well.  Currently she is on her previous home dose of diuretics and is not having any significant findings consistent with heart failure.      Past Medical History:  Diagnosis Date  . Allergy   . Anemia    childhood  . Anxiety   . Aortic stenosis   . Arterial stenosis (HCC)    mesenteric  . Arthritis   . Breast cancer ([place]) 12/05/10   R breast, inv mammary, in situ,ER/PR +,HER2 -  . Cancer ([place])   . Carcinoma of breast treated with adjuvant chemotherapy ([place])   . 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[place])   . Claudication ([place])   . Coronary artery disease    stent 2007  . Depression   .  Diabetes mellitus   . Difficulty sleeping   . Diverticulosis 12/10/03  . Elevated cholesterol   . Generalized weakness   . GERD (gastroesophageal reflux disease)   . Heart murmur   . Hepatitis    as an infant  . HSV-1 (herpes simplex virus 1) infection   . Hyperlipidemia   . Hypertension   . Hypothyroidism   . Osteoarthritis   . PAD (peripheral artery disease) ([place])   . Personal history of chemotherapy   . Personal history of radiation therapy   . Pneumonia    2014 september and March 2022  . Rheumatoid arthritis ([place])   . S/P aortic valve replacement with bioprosthetic valve 05/04/2020   Edwards Inspiris Resilia stented bovine pericardial tissue valve, size 23 mm  . 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 Spinal stenosis   . Thyroid disease      Social History   Tobacco Use  Smoking Status Former Smoker  . Packs/day: 2.00  .  Years: 40.00  . Pack years: 80.00  . Quit date: 12/19/1997  . Years since quitting: 22.4  Smokeless Tobacco Never Used    Social History   Substance and Sexual Activity  Alcohol Use Yes  . Alcohol/week: 1.0 standard drink  . Types: 1 Glasses of wine per week   Comment: occasiona - maybe once a month     Allergies  Allergen Reactions  . Codeine Nausea And Vomiting and Other (See Comments)    Severe stomach cramps  . Antihistamines, Diphenhydramine-Type Other (See Comments)    Causes hyperactivity  . Gabapentin     Urinary incontinence  . Statins Other (See Comments)    Joint pains  . Sulfa Antibiotics     Unknown to pt  . Erythromycin Hives and Rash    Due to dental work about 50 years ago. Was used in packing and resulted in rash/hives inside and outside of mouth.    Current Outpatient Medications  Medication Sig Dispense Refill  . Accu-Chek FastClix Lancets MISC USE TO CHECK BLOOD SUGARS 3 TIMES DAILY 306 each 3  . acetaminophen (TYLENOL) 500 MG tablet Take 1,000 mg by mouth every 6 (six) hours as needed for moderate pain or headache.    Marland Kitchen aspirin EC 81 MG tablet Take 81 mg by mouth at bedtime.    . calcium carbonate (TUMS - DOSED IN MG ELEMENTAL CALCIUM) 500 MG chewable tablet Chew 2 tablets by mouth 3 (three) times daily as needed for indigestion or heartburn.    . Cholecalciferol (VITAMIN D3) 25 MCG (1000 UT) CAPS Take 1,000 Units by mouth daily before lunch.    . clobetasol cream (TEMOVATE) 9.02 % Apply 1 application topically 2 (two) times daily. 30 g 0  . furosemide (LASIX) 40 MG tablet Take 40 mg by mouth daily.    Marland Kitchen glipiZIDE (GLUCOTROL XL) 10 MG 24 hr tablet Take 1 tablet (10 mg total) by mouth daily at 12 noon. 90 tablet 1  . glucose blood (ACCU-CHEK GUIDE) test strip TEST ONCE DAILY 100 each 5  . levothyroxine (SYNTHROID) 88 MCG tablet Take 1 tablet (88 mcg total) by mouth daily. 90 tablet 3  . losartan (COZAAR) 50 MG tablet Take 2 tablets (100 mg total) by  mouth daily. 60 tablet 1  . Magnesium 250 MG TABS Take 250 mg by mouth at bedtime.    Marland Kitchen  melatonin 3 MG TABS tablet Take 3 mg by mouth at bedtime.    . metFORMIN (GLUCOPHAGE) 500 MG tablet Take 1 tablet (500 mg total) by mouth daily with breakfast. 90 tablet 3  . metoprolol tartrate (LOPRESSOR) 25 MG tablet Take 1 tablet (25 mg total) by mouth 2 (two) times daily. 60 tablet 1  . Omega-3 Fatty Acids (FISH OIL) 1200 MG CAPS Take 1,200 mg by mouth in the morning and at bedtime.    . pantoprazole (PROTONIX) 40 MG tablet Take 1 tablet (40 mg total) by mouth daily. 90 tablet 3  . PARoxetine (PAXIL) 20 MG tablet Take 1 tablet (20 mg total) by mouth in the morning. 90 tablet 3  . pioglitazone (ACTOS) 30 MG tablet Take 1 tablet (30 mg total) by mouth daily. 90 tablet 1  . potassium chloride SA (KLOR-CON) 20 MEQ tablet Take 1 tablet (20 mEq total) by mouth daily. 3 tablet 0  . sennosides-docusate sodium (SENOKOT-S) 8.6-50 MG tablet Take 1 tablet by mouth daily. 90 tablet 0  . sitaGLIPtin (JANUVIA) 100 MG tablet Take 1 tablet (100 mg total) by mouth daily. 90 tablet 3  . valACYclovir (VALTREX) 1000 MG tablet Take 1 tablet (1,000 mg total) by mouth daily as needed (Fever Blisters). 180 tablet 0  . vitamin B-12 (CYANOCOBALAMIN) 500 MCG tablet Take 1,000 mcg by mouth daily with lunch.     No current facility-administered medications for this visit.       Physical Exam: BP (!) 159/70 (BP Location: Left Arm, Patient Position: Sitting, Cuff Size: Normal)   Pulse 88   Temp 97.9 F (36.6 C) (Skin)   Resp 20   Wt 147 lb (66.7 kg)   SpO2 95% Comment: RA  BMI 26.04 kg/m   General appearance: alert, cooperative and no distress Heart: regular rate and rhythm and no murmur Lungs: Minor crackles in the bases. Abdomen: Benign exam Extremities: No edema Wound: Incisions healing well without evidence of infection.   Diagnostic Studies & Laboratory data:     Recent Radiology Findings:   DG Chest 2  View  Result Date: 05/30/2020 CLINICAL DATA:  CABG EXAM: CHEST - 2 VIEW COMPARISON:  05/09/2020 FINDINGS: Changes of aortic valve replacement and CABG. Heart is normal size. Mild hyperinflation. Small left pleural effusion. Bibasilar opacities, favor atelectasis. No acute bony abnormality. IMPRESSION: Small left pleural effusion. Bibasilar opacities, likely atelectasis. Electronically Signed   By: Rolm Baptise M.D.   On: 05/30/2020 12:33      Recent Lab Findings: Lab Results  Component Value Date   WBC 12.5 (H) 05/09/2020   HGB 10.8 (L) 05/09/2020   HCT 33.8 (L) 05/09/2020   PLT 221 05/09/2020   GLUCOSE 69 (L) 05/09/2020   CHOL 189 06/24/2019   TRIG 292.0 (H) 06/24/2019   HDL 29.60 (L) 06/24/2019   LDLDIRECT 101.0 06/24/2019   LDLCALC 83 05/13/2008   ALT 15 05/02/2020   AST 21 05/02/2020   NA 138 05/09/2020   K 3.7 05/09/2020   CL 101 05/09/2020   CREATININE 1.64 (H) 05/09/2020   BUN 33 (H) 05/09/2020   CO2 29 05/09/2020   TSH 2.44 06/23/2019   INR 1.4 (H) 05/04/2020   HGBA1C 8.1 (H) 05/02/2020      Assessment / Plan: Ms. Paul continues to make excellent ongoing progress in her postsurgical recovery.  I did not make any medication changes to her current regimen.  She will continue to follow-up with cardiology in 1 week she is noted to  be currently keeping records of her blood pressures and medication may be titrated or added at next visit.  She does have some mild neuropathic discomfort associated with her sternotomy, but as she is on multiple medications I favor watchful waiting throughout the ongoing healing process.  She could potentially require additional agent if this does not resolve.  We will schedule an appointment for her to see Dr. Roxy Manns in 1 month with a repeat chest x-ray at that time.      Medication Changes: No orders of the defined types were placed in this encounter.     John Giovanni, PA-C 05/30/2020 12:54 PM

## 2020-05-30 NOTE — Assessment & Plan Note (Signed)
No angina 

## 2020-05-31 ENCOUNTER — Telehealth: Payer: Self-pay | Admitting: Family Medicine

## 2020-05-31 NOTE — Telephone Encounter (Signed)
What is the name of the medication? sitaGLIPtin (JANUVIA) 100 MG tablet [[id number]]    Have you contacted your pharmacy to request a refill? Yes, she is needing a refill. She is completely out.  Which pharmacy would you like this sent to? Pharmacy  [facility], [address] [place] [address]  Phone:  [phone] Fax:  [phone]  DEA #:  [id number]      Patient notified that their request is being sent to the clinical staff for review and that they should receive a call once it is complete. If they do not receive a call within 72 hours they can check with their pharmacy or our office.

## 2020-06-01 MED ORDER — SITAGLIPTIN PHOSPHATE 100 MG PO TABS: 100.0000 mg | ORAL_TABLET | Freq: Every day | ORAL | 3 refills | Status: DC

## 2020-06-01 NOTE — Telephone Encounter (Signed)
Rx sent 

## 2020-06-01 NOTE — Telephone Encounter (Signed)
Last fill 01/14/20  #90/3 Last OV 04/22/20

## 2020-06-06 ENCOUNTER — Telehealth: Payer: Self-pay

## 2020-06-06 NOTE — Telephone Encounter (Signed)
Patient contacted the office to state that one of her chest tube sites is draining serosanguinous fluid. Very minimal as she stated that "a couple spots got on my blouse". Advised that this is normal and can happen. Also advised to keep watch of the area and if she notices any changes in the drainage or signs/symptoms of infection to contact the office back.  She acknowledged receipt.

## 2020-06-15 ENCOUNTER — Ambulatory Visit (HOSPITAL_COMMUNITY): Payer: Medicare Other | Attending: Cardiology

## 2020-06-15 ENCOUNTER — Other Ambulatory Visit: Payer: Self-pay

## 2020-06-15 DIAGNOSIS — Z953 Presence of xenogenic heart valve: Secondary | ICD-10-CM | POA: Insufficient documentation

## 2020-06-15 LAB — ECHOCARDIOGRAM COMPLETE
AV Mean grad: 4 mmHg
AV Peak grad: 6.7 mmHg
Ao pk vel: 1.3 m/s
Area-P 1/2: 2.56 cm2
MV M vel: 5.67 m/s
MV Peak grad: 128.6 mmHg
Radius: 0.55 cm
S' Lateral: 2.9 cm

## 2020-06-16 ENCOUNTER — Ambulatory Visit (INDEPENDENT_AMBULATORY_CARE_PROVIDER_SITE_OTHER): Payer: Medicare Other | Admitting: Pharmacist

## 2020-06-16 DIAGNOSIS — I2510 Atherosclerotic heart disease of native coronary artery without angina pectoris: Secondary | ICD-10-CM

## 2020-06-16 DIAGNOSIS — E7800 Pure hypercholesterolemia, unspecified: Secondary | ICD-10-CM

## 2020-06-16 NOTE — Progress Notes (Signed)
Patient ID: [name]                 DOB: 1940-08-14                    MRN: [id number]     HPI: [name] is a 80 y.o. female patient referred to lipid clinic by [name]. PMH is significant for CAD, HTN, HLD, CKD, T2DM (A1c 8.1) and breast cancer.  Patient admitted on 4/13 and underwent CABG x2 and then AVR. Patient has done well since discharge. Patient seen by [name] on 05/30/20 for hospital follow up and referred to lipid clinic for CAD management.  Patient presents today in good spirits.  Feels well and reports no chest pain.  Has a history of intolerance to statins. Does not remember which ones she has tried but she reports it has been many.  Records show pitavastatin in 2012-2014.  Was intolerant to Zetia due to bloating.  Has used Welchol in the past.  Has not had as much of an appetite since her surgery.  Reports her blood sugar has been better controlled and she has lost a few pounds.  Husband takes Repatha every 2 weeks so she is familiar with the medication and the device.  Has not had lipid panel since 06/2019.  Purchased two new large bottles of OTC fish oil this week.  Current Medications: Fish oil 1028m BID Intolerances: Statins, Zetia Risk Factors: CAD, DM, gx of CABG, HTN LDL goal: <55  Labs:  HDL 29.6, Direct LDL 101, triglycerides 292, TC 189 (06/24/19 - not on any medication)  Past Medical History:  Diagnosis Date  . Allergy   . Anemia    childhood  . Anxiety   . Aortic stenosis   . Arterial stenosis (HCC)    mesenteric  . Arthritis   . Breast cancer ([facility] 12/05/10   R breast, inv mammary, in situ,ER/PR +,HER2 -  . Cancer ([facility]   . Carcinoma of breast treated with adjuvant chemotherapy ([facility]   . 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[facility]   . Claudication ([facility]   . Coronary artery disease    stent 2007  . Depression   . Diabetes mellitus   . Difficulty sleeping   . Diverticulosis 12/10/03  . Elevated cholesterol   . Generalized  weakness   . GERD (gastroesophageal reflux disease)   . Heart murmur   . Hepatitis    as an infant  . HSV-1 (herpes simplex virus 1) infection   . Hyperlipidemia   . Hypertension   . Hypothyroidism   . Osteoarthritis   . PAD (peripheral artery disease) ([facility]   . Personal history of chemotherapy   . Personal history of radiation therapy   . Pneumonia    2014 september and March 2022  . Rheumatoid arthritis ([facility]   . S/P aortic valve replacement with bioprosthetic valve 05/04/2020   Edwards Inspiris Resilia stented bovine pericardial tissue valve, size 23 mm  . 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   Dr JJuanita Craver . Spinal stenosis   . Thyroid disease     Current Outpatient Medications on File Prior to Visit  Medication Sig Dispense Refill  . Accu-Chek FastClix Lancets MISC USE TO CHECK BLOOD SUGARS 3 TIMES DAILY 306 each 3  . acetaminophen (TYLENOL) 500 MG tablet Take 1,000  mg by mouth every 6 (six) hours as needed for moderate pain or headache.    Marland Kitchen aspirin EC 81 MG tablet Take 81 mg by mouth at bedtime.    . calcium carbonate (TUMS - DOSED IN MG ELEMENTAL CALCIUM) 500 MG chewable tablet Chew 2 tablets by mouth 3 (three) times daily as needed for indigestion or heartburn.    . Cholecalciferol (VITAMIN D3) 25 MCG (1000 UT) CAPS Take 1,000 Units by mouth daily before lunch.    . clobetasol cream (TEMOVATE) 1.76 % Apply 1 application topically 2 (two) times daily. 30 g 0  . furosemide (LASIX) 40 MG tablet Take 40 mg by mouth daily.    Marland Kitchen glipiZIDE (GLUCOTROL XL) 10 MG 24 hr tablet Take 1 tablet (10 mg total) by mouth daily at 12 noon. 90 tablet 1  . glucose blood (ACCU-CHEK GUIDE) test strip TEST ONCE DAILY 100 each 5  . levothyroxine (SYNTHROID) 88 MCG tablet Take 1 tablet (88 mcg total) by mouth daily. 90 tablet 3  . losartan (COZAAR) 50 MG tablet Take 2 tablets (100 mg total) by mouth daily. 60 tablet 1  .  Magnesium 250 MG TABS Take 250 mg by mouth at bedtime.    . melatonin 3 MG TABS tablet Take 3 mg by mouth at bedtime.    . metFORMIN (GLUCOPHAGE) 500 MG tablet Take 1 tablet (500 mg total) by mouth daily with breakfast. 90 tablet 3  . metoprolol tartrate (LOPRESSOR) 25 MG tablet Take 1 tablet (25 mg total) by mouth 2 (two) times daily. 60 tablet 1  . Omega-3 Fatty Acids (FISH OIL) 1200 MG CAPS Take 1,200 mg by mouth in the morning and at bedtime.    . pantoprazole (PROTONIX) 40 MG tablet Take 1 tablet (40 mg total) by mouth daily. 90 tablet 3  . PARoxetine (PAXIL) 20 MG tablet Take 1 tablet (20 mg total) by mouth in the morning. 90 tablet 3  . pioglitazone (ACTOS) 30 MG tablet Take 1 tablet (30 mg total) by mouth daily. 90 tablet 1  . potassium chloride SA (KLOR-CON) 20 MEQ tablet Take 1 tablet (20 mEq total) by mouth daily. 3 tablet 0  . sennosides-docusate sodium (SENOKOT-S) 8.6-50 MG tablet Take 1 tablet by mouth daily. 90 tablet 0  . sitaGLIPtin (JANUVIA) 100 MG tablet Take 1 tablet (100 mg total) by mouth daily. 90 tablet 3  . valACYclovir (VALTREX) 1000 MG tablet Take 1 tablet (1,000 mg total) by mouth daily as needed (Fever Blisters). 180 tablet 0  . vitamin B-12 (CYANOCOBALAMIN) 500 MCG tablet Take 1,000 mcg by mouth daily with lunch.     No current facility-administered medications on file prior to visit.    Allergies  Allergen Reactions  . Codeine Nausea And Vomiting and Other (See Comments)    Severe stomach cramps  . Antihistamines, Diphenhydramine-Type Other (See Comments)    Causes hyperactivity  . Gabapentin     Urinary incontinence  . Statins Other (See Comments)    Joint pains  . Sulfa Antibiotics     Unknown to pt  . Erythromycin Hives and Rash    Due to dental work about 50 years ago. Was used in packing and resulted in rash/hives inside and outside of mouth.    Assessment/Plan:  1. Hyperlipidemia - Patient LDL 101 which is above goal of <55.  Last lab work ~ 1  year ago so unclear what current LDL level is.  However, needs aggressive management due to recent stents CAD,  and uncontrolled DM.  Patient agreeable to starting Repatha since her husband takes it and has good results.  Using [place] [employer], educated patient on mechanism of action, storage, site selection, and administration.  Patient did not want to practice in room since she says her husband can help her.  Will complete PA.    Patient triglycerides elevated also.  Recommended switching to Vascepa however she has recently purchased two large bottles of OTC fish oil and does not want to waste them.  Recommended she contact us when she is running low and we can transition to [place].  Patient voiced understanding.  Repeat lipid panel set for August 2022.  Recheck as needed.  Start Repatha 166m SQ Q 14d Repeat lipids in 2-3 months  CKarren Cobble PharmD, BCACP, [id number] [place] [id number]: [phone] Fax: [phone]/26/2022 10:47 AM

## 2020-06-16 NOTE — Patient Instructions (Addendum)
It was nice meeting you today!  We would like your LDL (bad cholesterol) to be less than 55  We will start a new medication called Repatha, which you will inject once every 2 weeks  We will complete the prior authorization for the medication and give you a call when it is complete  We will recheck your cholesterol in 2-3 months  When you are running low on your fish oil let us know and we can switch to the prescription product  Please call with any questions  Karren Cobble, PharmD, BCACP, [place], [place] [address] [place], [place] [address] Phone: [phone]; Fax: [phone] 06/16/2020 10:18 AM

## 2020-06-23 ENCOUNTER — Telehealth: Payer: Self-pay

## 2020-06-23 DIAGNOSIS — E7800 Pure hypercholesterolemia, unspecified: Secondary | ICD-10-CM

## 2020-06-23 NOTE — Telephone Encounter (Signed)
An appeal has been faxed on behalf of the pt to see if they qualify for repatha

## 2020-06-30 MED ORDER — REPATHA SURECLICK 140 MG/ML ~~LOC~~ SOAJ: 140.0000 mg | SUBCUTANEOUS | 11 refills | Status: DC

## 2020-06-30 NOTE — Telephone Encounter (Signed)
Received Repatha approval through 12/24/20.

## 2020-06-30 NOTE — Telephone Encounter (Signed)
Called and spoke w/pt stated the repatha approved, rx sent, pt already has labs scheduled and instructed them to call back if unaffordable, pt voiced understanding

## 2020-06-30 NOTE — Addendum Note (Signed)
Addended by: Allean Found on: 06/30/2020 09:55 AM   Modules accepted: Orders

## 2020-07-06 ENCOUNTER — Inpatient Hospital Stay (HOSPITAL_COMMUNITY): Admission: RE | Admit: 2020-07-06 | Payer: Medicare Other | Source: Ambulatory Visit

## 2020-07-08 ENCOUNTER — Other Ambulatory Visit: Payer: Self-pay | Admitting: Family Medicine

## 2020-07-08 DIAGNOSIS — IMO0002 Reserved for concepts with insufficient information to code with codable children: Secondary | ICD-10-CM

## 2020-07-11 ENCOUNTER — Other Ambulatory Visit: Payer: Self-pay | Admitting: Physician Assistant

## 2020-07-11 ENCOUNTER — Ambulatory Visit: Payer: Medicare Other | Admitting: Thoracic Surgery (Cardiothoracic Vascular Surgery)

## 2020-07-12 ENCOUNTER — Other Ambulatory Visit: Payer: Self-pay | Admitting: Thoracic Surgery (Cardiothoracic Vascular Surgery)

## 2020-07-12 DIAGNOSIS — Z951 Presence of aortocoronary bypass graft: Secondary | ICD-10-CM

## 2020-07-15 ENCOUNTER — Ambulatory Visit: Payer: Medicare Other | Admitting: Thoracic Surgery (Cardiothoracic Vascular Surgery)

## 2020-07-15 ENCOUNTER — Other Ambulatory Visit: Payer: Self-pay

## 2020-07-15 ENCOUNTER — Encounter: Payer: Self-pay | Admitting: Thoracic Surgery (Cardiothoracic Vascular Surgery)

## 2020-07-15 ENCOUNTER — Ambulatory Visit
Admission: RE | Admit: 2020-07-15 | Discharge: 2020-07-15 | Disposition: A | Discharge status: Home / Self Care | Payer: Medicare Other | Source: Ambulatory Visit | Attending: Thoracic Surgery (Cardiothoracic Vascular Surgery) | Admitting: Thoracic Surgery (Cardiothoracic Vascular Surgery)

## 2020-07-15 ENCOUNTER — Telehealth (INDEPENDENT_AMBULATORY_CARE_PROVIDER_SITE_OTHER): Payer: Self-pay | Admitting: Thoracic Surgery (Cardiothoracic Vascular Surgery)

## 2020-07-15 DIAGNOSIS — Z951 Presence of aortocoronary bypass graft: Secondary | ICD-10-CM

## 2020-07-15 DIAGNOSIS — Z953 Presence of xenogenic heart valve: Secondary | ICD-10-CM

## 2020-07-15 NOTE — Patient Instructions (Signed)

## 2020-07-15 NOTE — Progress Notes (Signed)
[address]       [place] [address]             [phone]     CARDIOTHORACIC SURGERY TELEPHONE VIRTUAL OFFICE NOTE  Primary Cardiologist is [name], MD PCP is Ronnald Nian, DO   HPI:  I spoke with [name] (DOB 1940-12-26 ) via telephone on 07/15/2020 at 12:29 PM and verified that I was speaking with the correct person using more than one form of identification.  We discussed the fact that I was contacting them from my office and they were located at home, as well as the reason(s) for conducting our visit virtually instead of in-person.  The patient expressed understanding the circumstances and agreed to proceed as described.   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  The patient's early postoperative recovery was uneventful and she was last seen in our office on May 30, 2020 at which time she was doing fairly well.  Routine follow-up echocardiogram performed Jun 15, 2020 revealed normal left ventricular systolic function with normal functioning bioprosthetic tissue valve in the aortic position.  I spoke with the patient over the telephone today to see how she is doing.  Overall she seems to be doing quite well.  She no longer has any significant pain in her chest.  She states that the left side of her chest does feel numb, which is expected following left internal mammary artery harvest and grafting for surgery.  Her appetite is good.  She is sleeping well at night.  She is walking regularly and overall getting along fairly well.  She states that she can tell  a significant improvement in comparison with how she felt prior to surgery.   Current Outpatient Medications  Medication Sig Dispense Refill   Accu-Chek FastClix Lancets MISC USE TO CHECK BLOOD SUGARS 3 TIMES DAILY 306 each 3   acetaminophen (TYLENOL) 500 MG tablet Take 1,000 mg by mouth every 6 (six) hours as needed for moderate pain or headache.     aspirin EC 81 MG tablet Take 81 mg by mouth at bedtime.     calcium carbonate (TUMS - DOSED IN MG ELEMENTAL CALCIUM) 500 MG chewable tablet Chew 2 tablets by mouth 3 (three) times daily as needed for indigestion or heartburn.     Cholecalciferol (VITAMIN D3) 25 MCG (1000 UT) CAPS Take 1,000 Units by mouth daily before lunch.     clobetasol cream (TEMOVATE) 0.23 % Apply 1 application topically 2 (two) times daily. 30 g 0   Evolocumab (REPATHA SURECLICK) 343 MG/ML SOAJ Inject 140 mg into the skin every 14 (fourteen) days. 2 mL 11   furosemide (LASIX) 40 MG tablet Take 40 mg by mouth daily.     glipiZIDE (GLUCOTROL XL) 10 MG 24 hr tablet Take 1 tablet (10 mg total) by mouth daily at 12 noon. 90 tablet 1   glucose blood (ACCU-CHEK GUIDE) test strip TEST ONCE DAILY 100 each 5   levothyroxine (SYNTHROID) 88 MCG tablet Take 1 tablet (88 mcg total) by mouth daily. 90 tablet 3   losartan (COZAAR) 50 MG tablet Take 2 tablets (100 mg total) by mouth daily.  60 tablet 1   Magnesium 250 MG TABS Take 250 mg by mouth at bedtime.     melatonin 3 MG TABS tablet Take 3 mg by mouth at bedtime.     metFORMIN (GLUCOPHAGE) 500 MG tablet Take 1 tablet (500 mg total) by mouth daily with breakfast. 90 tablet 3   metoprolol tartrate (LOPRESSOR) 25 MG tablet Take 1 tablet (25 mg total) by mouth 2 (two) times daily. 60 tablet 1   Omega-3 Fatty Acids (FISH OIL) 1200 MG CAPS Take 1,200 mg by mouth in the morning and at bedtime.     pantoprazole (PROTONIX) 40 MG tablet Take 1 tablet (40 mg total) by mouth daily. 90 tablet 3   PARoxetine (PAXIL) 20 MG tablet Take 1 tablet (20 mg  total) by mouth in the morning. 90 tablet 3   pioglitazone (ACTOS) 30 MG tablet TAKE 1 TABLET(30 MG) BY MOUTH DAILY 90 tablet 1   potassium chloride SA (KLOR-CON) 20 MEQ tablet Take 1 tablet (20 mEq total) by mouth daily. 3 tablet 0   sennosides-docusate sodium (SENOKOT-S) 8.6-50 MG tablet Take 1 tablet by mouth daily. 90 tablet 0   sitaGLIPtin (JANUVIA) 100 MG tablet Take 1 tablet (100 mg total) by mouth daily. 90 tablet 3   valACYclovir (VALTREX) 1000 MG tablet Take 1 tablet (1,000 mg total) by mouth daily as needed (Fever Blisters). 180 tablet 0   vitamin B-12 (CYANOCOBALAMIN) 500 MCG tablet Take 1,000 mcg by mouth daily with lunch.     No current facility-administered medications for this visit.     Diagnostic Tests:  CHEST - 2 VIEW   COMPARISON:  05/30/2020   FINDINGS: Normal heart size and mediastinal contours. CABG and aortic valve replacement. Postoperative right breast. Chronic interstitial coarsening. There is no edema, consolidation, effusion, or pneumothorax.   IMPRESSION: Stable chest.  No evidence of acute disease.     Electronically Signed   By: Monte Fantasia M.D.   On: 07/15/2020 10:33      ECHOCARDIOGRAM REPORT         Patient Name:   [name] Date of Exam: 06/15/2020  Medical Rec #:  [id number]       Height:       63.0 in  Accession #:    [id number]      Weight:       147.0 lb  Date of Birth:  [birth year]      BSA:          1.696 m  Patient Age:    13 years        BP:           159/70 mmHg  Patient Gender: F               HR:           70 bpm.  Exam Location:  [address]   Procedure: 2D Echo, 3D Echo, Cardiac Doppler, Color Doppler and Strain  Analysis   Indications:    Z95.3 AVR     History:        Patient has prior history of Echocardiogram examinations,  most                  recent 05/04/2020. CAD, Prior CABG, PAD, Aortic Valve  Disease;                  Risk Factors:Hypertension, Dyslipidemia and Former Smoker.  CKD  stage 4. Anemia. H/o breast cancer.                  Aortic Valve: 23 mm Edwards Inspiris Resilia valve is  present in                  the aortic position. Procedure Date: 05/04/20.     Sonographer:    Jessee Avers, RDCS  Referring Phys: [place]     1. Left ventricular ejection fraction, by estimation, is 60 to 65%. The  left ventricle has normal function. The left ventricle has no regional  wall motion abnormalities. Left ventricular diastolic parameters are  consistent with Grade I diastolic  dysfunction (impaired relaxation). The average left ventricular global  longitudinal strain is -17.3 %. The global longitudinal strain is normal.   2. Right ventricular systolic function is normal. The right ventricular  size is normal.   3. The mitral valve is normal in structure. Moderate mitral valve  regurgitation. No evidence of mitral stenosis.   4. The aortic valve has been repaired/replaced. Aortic valve  regurgitation is not visualized. No aortic stenosis is present. There is a  23 mm Edwards Inspiris Resilia valve present in the aortic position.  Procedure Date: 05/04/20. Echo findings are  consistent with normal structure and function of the aortic valve  prosthesis. Aortic valve mean gradient measures 4.0 mmHg. Aortic valve  Vmax measures 1.30 m/s.   5. The inferior vena cava is normal in size with greater than 50%  respiratory variability, suggesting right atrial pressure of 3 mmHg.   Comparison(s): Prior images reviewed side by side. TEE 05/04/20 EF 55-60%.  Moderate MR.   FINDINGS   Left Ventricle: Left ventricular ejection fraction, by estimation, is 60  to 65%. The left ventricle has normal function. The left ventricle has no  regional wall motion abnormalities. The average left ventricular global  longitudinal strain is -17.3 %.  The global longitudinal strain is normal. 3D left ventricular ejection  fraction analysis performed but not  reported based on interpreter  judgement due to suboptimal quality. The left ventricular internal cavity  size was normal in size. There is no left  ventricular hypertrophy. Left ventricular diastolic parameters are  consistent with Grade I diastolic dysfunction (impaired relaxation).   Right Ventricle: The right ventricular size is normal. No increase in  right ventricular wall thickness. Right ventricular systolic function is  normal.   Left Atrium: Left atrial size was normal in size.   Right Atrium: Right atrial size was normal in size.   Pericardium: There is no evidence of pericardial effusion.   Mitral Valve: The mitral valve is normal in structure. Moderate mitral  valve regurgitation, with posteriorly-directed jet. No evidence of mitral  valve stenosis. MV peak gradient, 8.6 mmHg. The mean mitral valve gradient  is 4.0 mmHg.   Tricuspid Valve: The tricuspid valve is normal in structure. Tricuspid  valve regurgitation is not demonstrated. No evidence of tricuspid  stenosis.   Aortic Valve: The aortic valve has been repaired/replaced. Aortic valve  regurgitation is not visualized. No aortic stenosis is present. Aortic  valve mean gradient measures 4.0 mmHg. Aortic valve peak gradient measures  6.7 mmHg. There is a 23 mm Edwards  Inspiris Resilia valve present in the aortic position. Procedure Date:  05/04/20. Echo findings are consistent with normal structure and function  of the aortic valve prosthesis.   Pulmonic Valve: The pulmonic valve was normal in structure.  Pulmonic valve  regurgitation is trivial. No evidence of pulmonic stenosis.   Aorta: The aortic root is normal in size and structure.   Venous: The inferior vena cava is normal in size with greater than 50%  respiratory variability, suggesting right atrial pressure of 3 mmHg.   IAS/Shunts: No atrial level shunt detected by color flow Doppler.      LEFT VENTRICLE  PLAX 2D  LVIDd:         4.20 cm  Diastology  LVIDs:         2.90 cm LV e' medial:    3.71 cm/s  LV PW:         0.90 cm LV E/e' medial:  37.7  LV IVS:        1.00 cm LV e' lateral:   3.26 cm/s                         LV E/e' lateral: 42.9                            2D Longitudinal Strain                         2D Strain GLS (A2C):   -18.0 %                         2D Strain GLS (A3C):   -19.2 %                         2D Strain GLS (A4C):   -14.6 %                         2D Strain GLS Avg:     -17.3 %                            3D Volume EF:                         3D EF:        53 %                         LV EDV:       104 ml                         LV ESV:       49 ml                         LV SV:        55 ml   RIGHT VENTRICLE  RV Basal diam:  2.90 cm  RV S prime:     5.12 cm/s  TAPSE (M-mode): 1.3 cm   LEFT ATRIUM             Index       RIGHT ATRIUM           Index  LA diam:        3.50 cm 2.06 cm/m  RA Pressure: 3.00 mmHg  LA Vol (A2C):   35.9 ml 21.16 ml/m RA Area:     10.60 cm  LA Vol (A4C):  29.2 ml 17.21 ml/m RA Volume:   21.30 ml  12.56 ml/m  LA Biplane Vol: 34.4 ml 20.28 ml/m   AORTIC VALVE  AV Vmax:           129.50 cm/s  AV Vmean:          94.350 cm/s  AV VTI:            0.312 m  AV Peak Grad:      6.7 mmHg  AV Mean Grad:      4.0 mmHg  LVOT Vmax:         84.20 cm/s  LVOT Vmean:        56.900 cm/s  LVOT VTI:          0.174 m  LVOT/AV VTI ratio: 0.56     AORTA  Ao Root diam: 3.10 cm   MITRAL VALVE                 TRICUSPID VALVE                               Estimated RAP:  3.00 mmHg  MV Peak grad:  8.6 mmHg  MV Mean grad:  4.0 mmHg      SHUNTS  MV Vmax:       1.47 m/s      Systemic VTI: 0.17 m  MV Vmean:      87.1 cm/s  MV Decel Time: 296 msec  MR Peak grad:    128.6 mmHg  MR Mean grad:    87.0 mmHg  MR Vmax:         567.00 cm/s  MR Vmean:        444.0 cm/s  MR PISA:         1.90 cm  MR PISA Eff ROA: 13 mm  MR PISA Radius:  0.55 cm  MV E velocity: 140.00 cm/s  MV A  velocity: 126.00 cm/s  MV E/A ratio:  1.11   Candee Furbish MD  Electronically signed by Candee Furbish MD  Signature Date/Time: 06/15/2020/11:48:56 AM        Impression:  Patient is doing very well nearly 3 months status post aortic valve replacement and coronary artery bypass grafting  Plan:  We have not recommended any change the patient's current medications.  I encouraged the patient to continue to increase her physical activity without any particular limitations.  She will continue to follow-up regularly with Dr. Johnsie Cancel.  The patient has been reminded regarding the importance of dental hygiene and the lifelong need for antibiotic prophylaxis for all dental cleanings and other related invasive procedures.  In the future the patient will call this office only should specific problems or questions arise.    I discussed limitations of evaluation and management via telephone.  The patient was advised to call back for repeat telephone consultation or to seek an in-person evaluation if questions arise or the patient's clinical condition changes in any significant manner.  I spent in excess of 10 minutes of non-face-to-face time during the conduct of this telephone virtual office consultation, including pre-visit review of the patient's records and direct conversation with the patient.      Valentina Gu. Roxy Manns, MD 07/15/2020 12:29 PM

## 2020-07-18 ENCOUNTER — Other Ambulatory Visit: Payer: Self-pay | Admitting: Cardiovascular Disease

## 2020-07-18 ENCOUNTER — Telehealth: Payer: Self-pay | Admitting: Cardiovascular Disease

## 2020-07-18 NOTE — Telephone Encounter (Signed)
Patient stated she was started on metoprolol 25 mg BID after her surgery, CABG x 2 and aortic valve replacement, on 05/04/20. Patient wants to know if she has to continue this medication. At patient's office visit in May with Richardson Dopp PA, patient's BP 140/60 and HR 75, he stated in his notes -  "Blood pressure above target.  I asked her to check her blood pressure over the next 2 weeks and send readings in for review.  If her blood pressure remains above target, consider amlodipine versus increasing metoprolol tartrate."  No readings of BP were given. Patient also was wanting to stop her lasix.  Patient stated she asked Dr. Roxy Manns if she needed to continue this medications and he told her to ask her cardiologist. Will forward to DOD, Dr. Irish Lack for advisement, since Dr. Johnsie Cancel is out this week.

## 2020-07-18 NOTE — Telephone Encounter (Signed)
Per Dr. Irish Lack, she can use the Lasix as needed for swelling or shortness of breath. Continue with plan to check BPs at home.  Called patient back with advisement. Patient verbalized understanding.

## 2020-07-18 NOTE — Telephone Encounter (Signed)
has been taking metoprolol 25mg  one after lunch and one after dinner.. directions say take one a day but shes taken two by mistake...has a newer rx of metoprolol that is 50mg  and is supposed to take one tablet a day.. please advise.

## 2020-07-20 ENCOUNTER — Encounter: Payer: Self-pay | Admitting: Family Medicine

## 2020-07-20 ENCOUNTER — Other Ambulatory Visit: Payer: Self-pay

## 2020-07-20 ENCOUNTER — Ambulatory Visit (INDEPENDENT_AMBULATORY_CARE_PROVIDER_SITE_OTHER): Payer: Medicare Other | Admitting: Family Medicine

## 2020-07-20 VITALS — BP 140/86 | HR 100 | Temp 96.9°F | Ht 63.0 in | Wt 146.8 lb

## 2020-07-20 DIAGNOSIS — E7800 Pure hypercholesterolemia, unspecified: Secondary | ICD-10-CM | POA: Diagnosis not present

## 2020-07-20 DIAGNOSIS — E1140 Type 2 diabetes mellitus with diabetic neuropathy, unspecified: Secondary | ICD-10-CM | POA: Diagnosis not present

## 2020-07-20 DIAGNOSIS — I10 Essential (primary) hypertension: Secondary | ICD-10-CM | POA: Diagnosis not present

## 2020-07-20 DIAGNOSIS — E1122 Type 2 diabetes mellitus with diabetic chronic kidney disease: Secondary | ICD-10-CM | POA: Diagnosis not present

## 2020-07-20 DIAGNOSIS — N184 Chronic kidney disease, stage 4 (severe): Secondary | ICD-10-CM

## 2020-07-20 LAB — HEPATIC FUNCTION PANEL
ALT: 9 U/L (ref 0–35)
AST: 16 U/L (ref 0–37)
Albumin: 4 g/dL (ref 3.5–5.2)
Alkaline Phosphatase: 63 U/L (ref 39–117)
Bilirubin, Direct: 0.1 mg/dL (ref 0.0–0.3)
Total Bilirubin: 0.5 mg/dL (ref 0.2–1.2)
Total Protein: 7 g/dL (ref 6.0–8.3)

## 2020-07-20 LAB — LIPID PANEL
Cholesterol: 195 mg/dL (ref 0–200)
HDL: 36.2 mg/dL — ABNORMAL LOW (ref >39.00)
NonHDL: 158.67
Total CHOL/HDL Ratio: 5
Triglycerides: 236 mg/dL — ABNORMAL HIGH (ref 0.0–149.0)
VLDL: 47.2 mg/dL — ABNORMAL HIGH (ref 0.0–40.0)

## 2020-07-20 LAB — LDL CHOLESTEROL, DIRECT: Direct LDL: 113 mg/dL

## 2020-07-20 LAB — HEMOGLOBIN A1C: Hgb A1c MFr Bld: 7.7 % — ABNORMAL HIGH (ref 4.6–6.5)

## 2020-07-20 NOTE — Patient Instructions (Signed)
Shoulder Range of Motion Exercises ?Shoulder range of motion (ROM) exercises are done to keep the shoulder moving freely or to increase movement. They are often recommended for people who have shoulder pain or stiffness or who are recovering from a shoulder surgery. ?Phase 1 exercises ?When you are able, do this exercise 1-2 times per day for 30-60 seconds in each direction, or as directed by your health care provider. ?Pendulum exercise ?To do this exercise while sitting: ?Sit in a chair or at the edge of your bed with your feet flat on the floor. ?Let your affected arm hang down in front of you over the edge of the bed or chair. ?Relax your shoulder, arm, and hand. ?Rock your body so your arm gently swings in small circles. You can also use your unaffected arm to start the motion. ?Repeat changing the direction of the circles, swinging your arm left and right, and swinging your arm forward and back. ?To do this exercise while standing: ?Stand next to a sturdy chair or table, and hold on to it with your hand on your unaffected side. ?Bend forward at the waist. ?Bend your knees slightly. ?Relax your shoulder, arm, and hand. ?While keeping your shoulder relaxed, use body motion to swing your arm in small circles. ?Repeat changing the direction of the circles, swinging your arm left and right, and swinging your arm forward and back. ?Between exercises, stand up tall and take a short break to relax your lower back. ? ?Phase 2 exercises ?Do these exercises 1-2 times per day or as told by your health care provider. Hold each stretch for 30 seconds, and repeat 3 times. Do the exercises with one or both arms as instructed by your health care provider. ?For these exercises, sit at a table with your hand and arm supported by the table. A chair that slides easily or has wheels can be helpful. ?External rotation ?Turn your chair so that your affected side is nearest to the table. ?Place your forearm on the table to your side.  Bend your elbow about 90? at the elbow (right angle) and place your hand palm facing down on the table. Your elbow should be about 6 inches away from your side. ?Keeping your arm on the table, lean your body forward. ?Abduction ?Turn your chair so that your affected side is nearest to the table. ?Place your forearm and hand on the table so that your thumb points toward the ceiling and your arm is straight out to your side. ?Slide your hand out to the side and away from you, using your unaffected arm to do the work. ?To increase the stretch, you can slide your chair away from the table. ?Flexion: forward stretch ?Sit facing the table. Place your hand and elbow on the table in front of you. ?Slide your hand forward and away from you, using your unaffected arm to do the work. ?To increase the stretch, you can slide your chair backward. ?Phase 3 exercises ?Do these exercises 1-2 times per day or as told by your health care provider. Hold each stretch for 30 seconds, and repeat 3 times. Do the exercises with one or both arms as instructed by your health care provider. ?Cross-body stretch: posterior capsule stretch ?Lift your arm straight out in front of you. ?Bend your arm 90? at the elbow (right angle) so your forearm moves across your body. ?Use your other arm to gently pull the elbow across your body, toward your other shoulder. ?Wall climbs ?Stand with   your affected arm extended out to the side with your hand resting on a door frame. ?Slide your hand slowly up the door frame. ?To increase the stretch, step through the door frame. Keep your body upright and do not lean. ?Wand exercises ?You will need a cane, a piece of PVC pipe, or a sturdy wooden dowel for wand exercises. ?Flexion ?To do this exercise while standing: ?Hold the wand with both of your hands, palms down. ?Using the other arm to help, lift your arms up and over your head, if able. ?Push upward with your other arm to gently increase the stretch. ?To do  this exercise while lying down: ?Lie on your back with your elbows resting on the floor and the wand in both your hands. Your hands will be palm down, or pointing toward your feet. ?Lift your hands toward the ceiling, using your unaffected arm to help if needed. ?Bring your arms overhead as able, using your unaffected arm to help if needed. ?Internal rotation ?Stand while holding the wand behind you with both hands. Your unaffected arm should be extended above your head with the arm of the affected side extended behind you at the level of your waist. The wand should be pointing straight up and down as you hold it. ?Slowly pull the wand up behind your back by straightening the elbow of your unaffected arm and bending the elbow of your affected arm. ?External rotation ?Lie on your back with your affected upper arm supported on a small pillow or rolled towel. When you first do this exercise, keep your upper arm close to your body. Over time, bring your arm up to a 90? angle out to the side. ?Hold the wand across your stomach and with both hands palm up. Your elbow on your affected side should be bent at a 90? angle. ?Use your unaffected side to help push your forearm away from you and toward the floor. Keep your elbow on your affected side bent at a 90? angle. ?Contact a health care provider if you have: ?New or increasing pain. ?New numbness, tingling, weakness, or discoloration in your arm or hand. ?This information is not intended to replace advice given to you by your health care provider. Make sure you discuss any questions you have with your health care provider. ?Document Revised: 02/20/2017 Document Reviewed: 02/20/2017 ?Elsevier Patient Education ? 2022 Elsevier Inc[employer] ? ?

## 2020-07-20 NOTE — Progress Notes (Signed)
Chief Complaint  Patient presents with   Follow-up    3 month f/u on DM and HTN Pt states she checks blood sugars randomly and they range between 108-135     HPI: *[name] is a 80 y.o. female here for DM, HTN, HLD follow-up. For DM, pt is taking metforming 550m daily, januiva 1067mdaily, glipizide 1028mID, actos 32m23mily. For HTN, pt is taking losartan 50mg98mly. For HLD, pt is on repatha and fish oil 1200mg 23m  Pt does check BS at home. Readings: 108-135 Hypoglycemia/Hypergylcemic episodes: no  She had CABG x 2 in 05/04/20. She is doing well overall.    Lab Results  Component Value Date   HGBA1C 8.1 (H) 05/02/2020   Lab Results  Component Value Date   MICROALBUR 25.9 (H) 11/20/2019   Lab Results  Component Value Date   CREATININE 1.64 (H) 05/09/2020   Lab Results  Component Value Date   NA 138 05/09/2020   K 3.7 05/09/2020   CREATININE 1.64 (H) 05/09/2020   GFRNONAA 32 (L) 05/09/2020   GFRAA 32 (L) 03/11/2020   GLUCOSE 69 (L) 05/09/2020   Lab Results  Component Value Date   CHOL 189 06/24/2019   HDL 29.60 (L) 06/24/2019   LDLCALC 83 05/13/2008   LDLDIRECT 101.0 06/24/2019   TRIG 292.0 (H) 06/24/2019   CHOLHDL 6 06/24/2019   Lab Results  Component Value Date   ALT 15 05/02/2020   AST 21 05/02/2020   ALKPHOS 61 05/02/2020   BILITOT 0.8 05/02/2020     The 10-year ASCVD risk score (Goff Mikey Bussing., et al., 2013) is: 47.6%   Values used to calculate the score:     Age: 57 yea63     Sex: Female     Is Non-Hispanic African American: No     Diabetic: Yes     Tobacco smoker: No     Systolic Blood Pressure: 140 mm027    Is BP treated: Yes     HDL Cholesterol: 29.6 mg/dL     Total Cholesterol: 189 mg/dL   Past Medical History:  Diagnosis Date   Allergy    Anemia    childhood   Anxiety    Aortic stenosis    Arterial stenosis (HCC)    mesenteric   Arthritis    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, inv mammary, in situ,ER/PR  +,HER2 -   Cancer (HCC)  [place] of breast treated with adjuvant chemotherapy (HCC)  [place] 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 (HCC)  [place] (HCC)  [place]ary artery disease    stent 2007   Depression    Diabetes mellitus    Difficulty sleeping    Diverticulosis 12/10/03   Elevated cholesterol    Generalized weakness    GERD (gastroesophageal reflux disease)    Heart murmur    Hepatitis    as an infant   HSV-1 (herpes simplex virus 1) infection    Hyperlipidemia    Hypertension    Hypothyroidism    Osteoarthritis    PAD (peripheral artery disease) (HCC)  [place] of chemotherapy    Personal history of radiation therapy    Pneumonia    2014 september and March 2022   Rheumatoid arthritis (HCC)  [place]/P aortic valve replacement with bioprosthetic valve 05/04/2020   [facility] Inspiris Resilia stented bovine pericardial tissue valve, size 23 mm  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   LIMA to D3, SVG to PDA, EVH via right thigh   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   Dr [name]   Spinal stenosis    Thyroid disease     Past Surgical History:  Procedure Laterality Date   ABDOMINAL AORTOGRAM W/LOWER EXTREMITY Bilateral 04/29/2019   Procedure: ABDOMINAL AORTOGRAM W/LOWER EXTREMITY;  Surgeon: Marty Heck, MD;  Location: [facility];  Service: Cardiovascular;  Laterality: Bilateral;   ABDOMINAL AORTOGRAM W/LOWER EXTREMITY Left 07/01/2019   Procedure: ABDOMINAL AORTOGRAM W/ Left LOWER EXTREMITY [place];  Surgeon: Marty Heck, MD;  Location: [facility];  Service: Cardiovascular;  Laterality: Left;   ANTERIOR AND POSTERIOR REPAIR N/A 04/05/2016   Procedure: ANTERIOR (CYSTOCELE) AND POSTERIOR REPAIR (RECTOCELE);  Surgeon: [name], MD;  Location: [facility];  Service: Gynecology;  Laterality: N/A;   AORTIC VALVE REPLACEMENT N/A 05/04/2020   Procedure: AORTIC VALVE REPLACEMENT (AVR) USING INSPIRIS 23MM RESILIA AORTIC VALVE;  Surgeon: Rexene Alberts, MD;  Location: [facility];  Service: Open Heart Surgery;  Laterality: N/A;   BREAST BIOPSY     BREAST LUMPECTOMY  1983   benign biopsy   BREAST LUMPECTOMY Right 12/29/2010   snbx, ER/PR +, Her2 -, 0/1 node pos.   CARPAL TUNNEL RELEASE Bilateral 9/12,8,12   CHOLECYSTECTOMY     COLONOSCOPY N/A 02/09/2013   Procedure: COLONOSCOPY;  Surgeon: [name], MD;  Location: [facility];  Service: Endoscopy;  Laterality: N/A;   COLONOSCOPY     CORONARY ANGIOPLASTY     CORONARY ARTERY BYPASS GRAFT N/A 05/04/2020   Procedure: CORONARY ARTERY BYPASS GRAFTING (CABG), ON PUMP, TIMES TWO, USING LEFT INTERNAL MAMMARY ARTERY AND RIGHT ENDOSCOPICALLY HARVESTED GREATER SAPHENOUS VEIN;  Surgeon: Rexene Alberts, MD;  Location: [facility];  Service: Open Heart Surgery;  Laterality: N/A;   DILATION AND CURETTAGE OF UTERUS     ESOPHAGOGASTRODUODENOSCOPY N/A 02/09/2013   Procedure: ESOPHAGOGASTRODUODENOSCOPY (EGD);  Surgeon: [name], MD;  Location: [name];  Service: Endoscopy;  Laterality: N/A;   FOOT SPURS     HYSTEROSCOPY WITH D & C N/A 09/11/2012   Procedure: DILATATION AND CURETTAGE ;  Surgeon: [name], MD;  Location: [facility];  Service: Gynecology;  Laterality: N/A;   KNEE SURGERY  2007   LAPAROSCOPIC ASSISTED VAGINAL HYSTERECTOMY N/A 04/05/2016   Procedure: LAPAROSCOPIC ASSISTED VAGINAL HYSTERECTOMY possible BSO;  Surgeon: [name], MD;  Location: [facility];  Service: Gynecology;  Laterality: N/A;   LUMBAR LAMINECTOMY/DECOMPRESSION MICRODISCECTOMY N/A 10/20/2014   Procedure: MICRO LUMBER DECOMPRESSION L3-4 L4-5;  Surgeon: [name], MD;  Location: [facility];  Service: Orthopedics;  Laterality: N/A;   LUMBAR LAMINECTOMY/DECOMPRESSION MICRODISCECTOMY Bilateral 10/13/2015   Procedure: MICRO LUMBAR DECOMPRESSION L5 - S1 AND REDO DECOMPRESSION L4 - L5 AND REMOVAL OF FACET CYST L4 - L5 2 LEVELS;  Surgeon: [name], MD;  Location: [facility];  Service: Orthopedics;  Laterality: Bilateral;    PERIPHERAL VASCULAR INTERVENTION Right 04/29/2019   Procedure: PERIPHERAL VASCULAR INTERVENTION;  Surgeon: Marty Heck, MD;  Location: [facility];  Service: Cardiovascular;  Laterality: Right;   PORT-A-CATH REMOVAL  04/17/2011   Procedure: REMOVAL PORT-A-CATH;  Surgeon: [name], MD;  Location: [facility];  Service: General;  Laterality: Left;   PORTACATH PLACEMENT  02/07/2011   Procedure: INSERTION PORT-A-CATH;  Surgeon: [name], MD;  [facility]: [facility];  Service: General;  Laterality: N/A;   RIGHT/LEFT HEART CATH AND CORONARY ANGIOGRAPHY  N/A 03/17/2020   Procedure: RIGHT/LEFT HEART CATH AND CORONARY ANGIOGRAPHY;  Surgeon: Burnell Blanks, MD;  Location: [facility];  Service: Cardiovascular;  Laterality: N/A;   TEE WITHOUT CARDIOVERSION N/A 05/04/2020   Procedure: TRANSESOPHAGEAL ECHOCARDIOGRAM (TEE);  Surgeon: Rexene Alberts, MD;  Location: [facility];  Service: Open Heart Surgery;  Laterality: N/A;   UPPER GASTROINTESTINAL ENDOSCOPY      Social History   Socioeconomic History   Marital status: Married    Spouse name: Not on file   Number of children: 3   Years of education: Not on file   Highest education level: Not on file  Occupational History   Occupation: retired-bookkeeping  Tobacco Use   Smoking status: Former    Packs/day: 2.00    Years: 40.00    Pack years: 80.00    Types: Cigarettes    Quit date: 12/19/1997    Years since quitting: 22.6   Smokeless tobacco: Never  Vaping Use   Vaping Use: Never used  Substance and Sexual Activity   Alcohol use: Yes    Alcohol/week: 1.0 standard drink    Types: 1 Glasses of wine per week    Comment: occasiona - maybe once a month   Drug use: No   Sexual activity: Not on file    Comment: menarch age 89, P25, HRT X 10 YRS, MENOPAUSE MID 40'S  Other Topics Concern   Not on file  Social History Narrative   [place] age 36- [place] age 41  [place]   Social  Determinants of Radio broadcast assistant Strain: Low Risk    Difficulty of Paying Living Expenses: Not hard at all  Food Insecurity: No Food Insecurity   Worried About Charity fundraiser in the Last Year: Never true   Arboriculturist in the Last Year: Never true  Transportation Needs: No Transportation Needs   Lack of Transportation (Medical): No   Lack of Transportation (Non-Medical): No  Physical Activity: Inactive   Days of Exercise per Week: 0 days   Minutes of Exercise per Session: 0 min  Stress: No Stress Concern Present   Feeling of Stress : Not at all  Social Connections: Socially Integrated   Frequency of Communication with Friends and Family: More than three times a week   Frequency of Social Gatherings with Friends and Family: More than three times a week   Attends Religious Services: More than 4 times per year   Active Member of [employer] or Organizations: Yes   Attends Music therapist: More than 4 times per year   Marital Status: Married  Human resources officer Violence: Not At Risk   Fear of Current or Ex-Partner: No   Emotionally Abused: No   Physically Abused: No   Sexually Abused: No    Family History  Problem Relation Age of Onset   Throat cancer Father    Alcoholism Father    Heart attack Father    Heart disease Mother    Lymphoma Brother 21   Cancer Son    Heart attack Brother    Diabetes Brother    Hypertension Brother    Colon cancer Neg Hx    Stroke Neg Hx    Esophageal cancer Neg Hx    Rectal cancer Neg Hx    Stomach cancer Neg Hx      Immunization History  Administered Date(s) Administered   Fluad Quad(high Dose 65+) 11/20/2018   Influenza Split 12/07/2014  Influenza, High Dose Seasonal PF 11/26/2016   Influenza,inj,quad, With Preservative 02/22/2017, 11/20/2018   Influenza-Unspecified 10/31/2009, 12/01/2010, 12/03/2011, 11/05/2012, 11/05/2013, 10/25/2014, 11/10/2015, 11/27/2017, 10/07/2019   PFIZER(Purple Top)SARS-COV-2  Vaccination 02/12/2019, 03/05/2019   Pneumococcal Conjugate-13 07/30/2014   Pneumococcal Polysaccharide-23 11/19/2007, 11/15/2008   Pneumococcal-Unspecified 01/23/2010   Zoster Recombinat (Shingrix) 02/01/2011    Outpatient Encounter Medications as of 07/20/2020  Medication Sig   Accu-Chek FastClix Lancets MISC USE TO CHECK BLOOD SUGARS 3 TIMES DAILY   acetaminophen (TYLENOL) 500 MG tablet Take 1,000 mg by mouth every 6 (six) hours as needed for moderate pain or headache.   aspirin EC 81 MG tablet Take 81 mg by mouth at bedtime.   calcium carbonate (TUMS - DOSED IN MG ELEMENTAL CALCIUM) 500 MG chewable tablet Chew 2 tablets by mouth 3 (three) times daily as needed for indigestion or heartburn.   Cholecalciferol (VITAMIN D3) 25 MCG (1000 UT) CAPS Take 1,000 Units by mouth daily before lunch.   clobetasol cream (TEMOVATE) 2.13 % Apply 1 application topically 2 (two) times daily.   Evolocumab (REPATHA SURECLICK) 086 MG/ML SOAJ Inject 140 mg into the skin every 14 (fourteen) days.   furosemide (LASIX) 40 MG tablet Take 40 mg by mouth daily.   glipiZIDE (GLUCOTROL XL) 10 MG 24 hr tablet Take 1 tablet (10 mg total) by mouth daily at 12 noon.   glucose blood (ACCU-CHEK GUIDE) test strip TEST ONCE DAILY   levothyroxine (SYNTHROID) 88 MCG tablet Take 1 tablet (88 mcg total) by mouth daily.   losartan (COZAAR) 50 MG tablet Take 2 tablets (100 mg total) by mouth daily.   Magnesium 250 MG TABS Take 250 mg by mouth at bedtime.   metFORMIN (GLUCOPHAGE) 500 MG tablet Take 1 tablet (500 mg total) by mouth daily with breakfast.   metoprolol tartrate (LOPRESSOR) 25 MG tablet TAKE 1 TABLET(25 MG) BY MOUTH TWICE DAILY   Omega-3 Fatty Acids (FISH OIL) 1200 MG CAPS Take 1,200 mg by mouth in the morning and at bedtime.   pantoprazole (PROTONIX) 40 MG tablet Take 1 tablet (40 mg total) by mouth daily.   PARoxetine (PAXIL) 20 MG tablet Take 1 tablet (20 mg total) by mouth in the morning.   pioglitazone (ACTOS) 30  MG tablet TAKE 1 TABLET(30 MG) BY MOUTH DAILY   potassium chloride SA (KLOR-CON) 20 MEQ tablet Take 1 tablet (20 mEq total) by mouth daily.   sennosides-docusate sodium (SENOKOT-S) 8.6-50 MG tablet Take 1 tablet by mouth daily.   sitaGLIPtin (JANUVIA) 100 MG tablet Take 1 tablet (100 mg total) by mouth daily.   valACYclovir (VALTREX) 1000 MG tablet Take 1 tablet (1,000 mg total) by mouth daily as needed (Fever Blisters).   vitamin B-12 (CYANOCOBALAMIN) 500 MCG tablet Take 1,000 mcg by mouth daily with lunch.   melatonin 3 MG TABS tablet Take 3 mg by mouth at bedtime. (Patient not taking: Reported on 07/20/2020)   No facility-administered encounter medications on file as of 07/20/2020.     ROS: Gen: no fever, chills  Eyes: no blurry vision, double vision Resp: no cough, wheeze,SOB CV: no CP, palpitations, LE edema,  GI: no heartburn, n/v/d/c, abd pain GU: no dysuria, urgency, frequency Neuro: no dizziness, headache, weakness   Allergies  Allergen Reactions   Codeine Nausea And Vomiting and Other (See Comments)    Severe stomach cramps   Antihistamines, Diphenhydramine-Type Other (See Comments)    Causes hyperactivity   Gabapentin     Urinary incontinence   Statins Other (See Comments)  Joint pains   Sulfa Antibiotics     Unknown to pt   Erythromycin Hives and Rash    Due to dental work about 50 years ago. Was used in packing and resulted in rash/hives inside and outside of mouth.    BP 140/86 (BP Location: Left Arm, Patient Position: Sitting, Cuff Size: Normal)   Pulse 100   Temp (!) 96.9 F (36.1 C) (Temporal)   Ht 5' 3" (1.6 m)   Wt 146 lb 12.8 oz (66.6 kg)   SpO2 97%   BMI 26.00 kg/m  Wt Readings from Last 3 Encounters:  07/20/20 146 lb 12.8 oz (66.6 kg)  05/30/20 147 lb (66.7 kg)  05/30/20 147 lb (66.7 kg)   Temp Readings from Last 3 Encounters:  07/20/20 (!) 96.9 F (36.1 C) (Temporal)  05/30/20 97.9 F (36.6 C) (Skin)  05/09/20 98 F (36.7 C) (Oral)    BP Readings from Last 3 Encounters:  07/20/20 140/86  05/30/20 (!) 159/70  05/30/20 140/60   Pulse Readings from Last 3 Encounters:  07/20/20 100  05/30/20 88  05/30/20 75    Physical Exam Constitutional:      General: She is not in acute distress.    Appearance: Normal appearance. She is not ill-appearing.  Cardiovascular:     Rate and Rhythm: Normal rate and regular rhythm.     Pulses: Normal pulses.  Pulmonary:     Effort: Pulmonary effort is normal. No respiratory distress.     Breath sounds: No wheezing or rhonchi.  Musculoskeletal:     Right lower leg: No edema.     Left lower leg: No edema.  Neurological:     Mental Status: She is alert and oriented to person, place, and time.  Psychiatric:        Mood and Affect: Mood normal.        Behavior: Behavior normal.     A/P:   1. Type 2 diabetes mellitus with diabetic neuropathy, without long-term current use of insulin (HCC) - last A1C in 04/2020 = 8.1 - home BS 108-135 - cont metforming 552m daily, januiva 1034mdaily, glipizide 1061mID, actos 27m50mily - cont losartan, repatha - Hemoglobin A1c - will call pt with result and adjust meds as needed - f/u in 3 mo  2. 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HCC) - stable, at baseline  3. Primary hypertension - controlled, at goal - cont losartan 50mg39mly  4. HYPERCHOLESTEROLEMIA - on repatha and fish oil - follows with cardio - FLP and LFTs ordered previously by Dr. NishaJohnsie Cancelwill be drawn here today   This visit occurred during the SARS-CoV-2 public health emergency.  Safety protocols were in place, including screening questions prior to the visit, additional usage of staff PPE, and extensive cleaning of exam room while observing appropriate contact time as indicated for disinfecting solutions.

## 2020-07-24 ENCOUNTER — Other Ambulatory Visit: Payer: Self-pay | Admitting: Family Medicine

## 2020-07-24 DIAGNOSIS — E039 Hypothyroidism, unspecified: Secondary | ICD-10-CM

## 2020-07-26 ENCOUNTER — Encounter: Payer: Self-pay | Admitting: Family Medicine

## 2020-07-26 ENCOUNTER — Ambulatory Visit (INDEPENDENT_AMBULATORY_CARE_PROVIDER_SITE_OTHER): Payer: Medicare Other | Admitting: Family Medicine

## 2020-07-26 ENCOUNTER — Other Ambulatory Visit: Payer: Self-pay

## 2020-07-26 VITALS — BP 148/68 | HR 70 | Temp 97.0°F | Ht 63.0 in | Wt 150.4 lb

## 2020-07-26 DIAGNOSIS — H0289 Other specified disorders of eyelid: Secondary | ICD-10-CM | POA: Diagnosis not present

## 2020-07-26 DIAGNOSIS — I10 Essential (primary) hypertension: Secondary | ICD-10-CM

## 2020-07-26 DIAGNOSIS — E1140 Type 2 diabetes mellitus with diabetic neuropathy, unspecified: Secondary | ICD-10-CM | POA: Diagnosis not present

## 2020-07-26 DIAGNOSIS — E7800 Pure hypercholesterolemia, unspecified: Secondary | ICD-10-CM | POA: Diagnosis not present

## 2020-07-26 MED ORDER — LOSARTAN POTASSIUM 25 MG PO TABS: ORAL_TABLET | ORAL | 2 refills | Status: DC

## 2020-07-26 MED ORDER — METOPROLOL TARTRATE 25 MG PO TABS: ORAL_TABLET | ORAL | 3 refills | Status: DC

## 2020-07-26 NOTE — Progress Notes (Signed)
Established Patient Office Visit  Subjective:  Patient ID: [name], female    DOB: March 19, 1940  Age: 80 y.o. MRN: [id number]  CC:  Chief Complaint  Patient presents with   Eye Problem    Left eye weakness, drainage feels like there is something scratching her eye ball.     HPI [name] CODERRE presents for evaluation of an irritation under her left upper eyelid.  She says that her eyes been weak and there has been discharge.  She describes weakness is difficulty with vision.  There was no injury to the eye.  She does not wear contacts.  She would also like to discuss her lab work.  Hemoglobin A1c was 7.7 LDL cholesterol was 121.  She is unable to tolerate statins and was recently started on Repatha.  Status post recent aortic valve replacement and CABG x2 on 4/13.  Blood pressure has been running in the upper 140s and she takes an extra pill when she gets a headache.   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, inv mammary, in situ,ER/PR +,HER2 -   Cancer ([place])    Carcinoma of breast treated with adjuvant chemotherapy ([place])    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[place])    Claudication ([place])    Coronary artery disease    stent 2007   Depression    Diabetes mellitus    Difficulty sleeping    Diverticulosis 12/10/03   Elevated cholesterol    Generalized weakness    GERD (gastroesophageal reflux disease)    Heart murmur    Hepatitis    as an infant   HSV-1 (herpes simplex virus 1) infection    Hyperlipidemia    Hypertension    Hypothyroidism    Osteoarthritis    PAD (peripheral artery disease) ([place])    Personal history of chemotherapy    Personal history of radiation therapy    Pneumonia    2014 september and March 2022   Rheumatoid arthritis ([place])    S/P aortic valve replacement with bioprosthetic  valve 05/04/2020   [facility] Inspiris Resilia stented bovine pericardial tissue valve, size 23 mm   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   LIMA to D3, SVG to PDA, EVH via right thigh   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   Dr [name]   Spinal stenosis    Thyroid disease     Past Surgical History:  Procedure Laterality Date   ABDOMINAL AORTOGRAM W/LOWER EXTREMITY Bilateral 04/29/2019   Procedure: ABDOMINAL AORTOGRAM W/LOWER EXTREMITY;  Surgeon: Marty Heck, MD;  Location: [facility];  Service: Cardiovascular;  Laterality: Bilateral;   ABDOMINAL AORTOGRAM W/LOWER EXTREMITY Left 07/01/2019   Procedure: ABDOMINAL AORTOGRAM W/ Left LOWER EXTREMITY [place];  Surgeon: Marty Heck, MD;  Location: [facility];  Service: Cardiovascular;  Laterality: Left;   ANTERIOR AND POSTERIOR REPAIR N/A 04/05/2016   Procedure: ANTERIOR (CYSTOCELE) AND POSTERIOR REPAIR (RECTOCELE);  Surgeon: [name], MD;  Location: [facility];  Service: Gynecology;  Laterality: N/A;   AORTIC VALVE REPLACEMENT N/A 05/04/2020   Procedure: AORTIC VALVE REPLACEMENT (AVR) USING INSPIRIS 23MM RESILIA AORTIC VALVE;  Surgeon: Rexene Alberts, MD;  Location: [facility];  Service: Open Heart Surgery;  Laterality: N/A;   BREAST BIOPSY     BREAST LUMPECTOMY  1983   benign biopsy   BREAST LUMPECTOMY Right 12/29/2010   snbx, ER/PR +, Her2 -, 0/1 node pos.   CARPAL TUNNEL RELEASE Bilateral 9/12,8,12   CHOLECYSTECTOMY     COLONOSCOPY N/A 02/09/2013   Procedure: COLONOSCOPY;  Surgeon: [name], MD;  [facility]: [facility];  Service: Endoscopy;  Laterality: N/A;   COLONOSCOPY     CORONARY ANGIOPLASTY     CORONARY ARTERY BYPASS GRAFT N/A 05/04/2020   Procedure: CORONARY ARTERY BYPASS GRAFTING (CABG), ON PUMP, TIMES TWO, USING LEFT INTERNAL MAMMARY ARTERY AND RIGHT ENDOSCOPICALLY HARVESTED GREATER SAPHENOUS VEIN;  Surgeon: Rexene Alberts, MD;  Location: [facility];  Service: Open Heart Surgery;  Laterality: N/A;   DILATION AND  CURETTAGE OF UTERUS     ESOPHAGOGASTRODUODENOSCOPY N/A 02/09/2013   Procedure: ESOPHAGOGASTRODUODENOSCOPY (EGD);  Surgeon: [name], MD;  Location: [name];  Service: Endoscopy;  Laterality: N/A;   FOOT SPURS     HYSTEROSCOPY WITH D & C N/A 09/11/2012   Procedure: DILATATION AND CURETTAGE ;  Surgeon: [name], MD;  Location: [facility];  Service: Gynecology;  Laterality: N/A;   KNEE SURGERY  2007   LAPAROSCOPIC ASSISTED VAGINAL HYSTERECTOMY N/A 04/05/2016   Procedure: LAPAROSCOPIC ASSISTED VAGINAL HYSTERECTOMY possible BSO;  Surgeon: [name], MD;  Location: [facility];  Service: Gynecology;  Laterality: N/A;   LUMBAR LAMINECTOMY/DECOMPRESSION MICRODISCECTOMY N/A 10/20/2014   Procedure: MICRO LUMBER DECOMPRESSION L3-4 L4-5;  Surgeon: [name], MD;  [facility];  Service: Orthopedics;  Laterality: N/A;   LUMBAR LAMINECTOMY/DECOMPRESSION MICRODISCECTOMY Bilateral 10/13/2015   Procedure: MICRO LUMBAR DECOMPRESSION L5 - S1 AND REDO DECOMPRESSION L4 - L5 AND REMOVAL OF FACET CYST L4 - L5 2 LEVELS;  Surgeon: [name], MD;  Location: [facility];  Service: Orthopedics;  Laterality: Bilateral;   PERIPHERAL VASCULAR INTERVENTION Right 04/29/2019   Procedure: PERIPHERAL VASCULAR INTERVENTION;  Surgeon: Marty Heck, MD;  Location: [facility];  Service: Cardiovascular;  Laterality: Right;   PORT-A-CATH REMOVAL  04/17/2011   Procedure: REMOVAL PORT-A-CATH;  Surgeon: [name], MD;  Location: [facility];  Service: General;  Laterality: Left;   PORTACATH PLACEMENT  02/07/2011   Procedure: INSERTION PORT-A-CATH;  Surgeon: [name], MD;  [facility];  Service: General;  Laterality: N/A;   RIGHT/LEFT HEART CATH AND CORONARY ANGIOGRAPHY N/A 03/17/2020   Procedure: RIGHT/LEFT HEART CATH AND CORONARY ANGIOGRAPHY;  Surgeon: Burnell Blanks, MD;  Location: [facility];  Service: Cardiovascular;  Laterality: N/A;   TEE WITHOUT  CARDIOVERSION N/A 05/04/2020   Procedure: TRANSESOPHAGEAL ECHOCARDIOGRAM (TEE);  Surgeon: Rexene Alberts, MD;  Location: [facility];  Service: Open Heart Surgery;  Laterality: N/A;   UPPER GASTROINTESTINAL ENDOSCOPY      Family History  Problem Relation Age of Onset   Throat cancer Father    Alcoholism Father    Heart attack Father    Heart disease Mother    Lymphoma Brother 35   Cancer Son    Heart attack Brother    Diabetes Brother    Hypertension Brother    Colon cancer Neg Hx    Stroke Neg Hx    Esophageal cancer Neg Hx    Rectal cancer Neg Hx    Stomach cancer Neg Hx     Social History   Socioeconomic History   Marital status: Married    Spouse name: Not on file   Number  of children: 3   Years of education: Not on file   Highest education level: Not on file  Occupational History   Occupation: retired-bookkeeping  Tobacco Use   Smoking status: Former    Packs/day: 2.00    Years: 40.00    Pack years: 80.00    Types: Cigarettes    Quit date: 12/19/1997    Years since quitting: 22.6   Smokeless tobacco: Never  Vaping Use   Vaping Use: Never used  Substance and Sexual Activity   Alcohol use: Yes    Alcohol/week: 1.0 standard drink    Types: 1 Glasses of wine per week    Comment: occasiona - maybe once a month   Drug use: No   Sexual activity: Not on file    Comment: menarch age 73, P24, HRT X 10 YRS, MENOPAUSE MID 40'S  Other Topics Concern   Not on file  Social History Narrative   [place] age 64- [place] age 54  [place]   Social Determinants of Radio broadcast assistant Strain: Low Risk    Difficulty of Paying Living Expenses: Not hard at all  Food Insecurity: No Food Insecurity   Worried About Charity fundraiser in the Last Year: Never true   Arboriculturist in the Last Year: Never true  Transportation Needs: No Transportation Needs   Lack of Transportation (Medical): No   Lack of Transportation (Non-Medical): No  Physical Activity:  Inactive   Days of Exercise per Week: 0 days   Minutes of Exercise per Session: 0 min  Stress: No Stress Concern Present   Feeling of Stress : Not at all  Social Connections: Socially Integrated   Frequency of Communication with Friends and Family: More than three times a week   Frequency of Social Gatherings with Friends and Family: More than three times a week   Attends Religious Services: More than 4 times per year   Active Member of [employer] or Organizations: Yes   Attends Music therapist: More than 4 times per year   Marital Status: Married  Human resources officer Violence: Not At Risk   Fear of Current or Ex-Partner: No   Emotionally Abused: No   Physically Abused: No   Sexually Abused: No    Outpatient Medications Prior to Visit  Medication Sig Dispense Refill   Accu-Chek FastClix Lancets MISC USE TO CHECK BLOOD SUGARS 3 TIMES DAILY 306 each 3   acetaminophen (TYLENOL) 500 MG tablet Take 1,000 mg by mouth every 6 (six) hours as needed for moderate pain or headache.     aspirin EC 81 MG tablet Take 81 mg by mouth at bedtime.     calcium carbonate (TUMS - DOSED IN MG ELEMENTAL CALCIUM) 500 MG chewable tablet Chew 2 tablets by mouth 3 (three) times daily as needed for indigestion or heartburn.     Cholecalciferol (VITAMIN D3) 25 MCG (1000 UT) CAPS Take 1,000 Units by mouth daily before lunch.     clobetasol cream (TEMOVATE) 0.32 % Apply 1 application topically 2 (two) times daily. 30 g 0   Evolocumab (REPATHA SURECLICK) 122 MG/ML SOAJ Inject 140 mg into the skin every 14 (fourteen) days. 2 mL 11   glipiZIDE (GLUCOTROL XL) 10 MG 24 hr tablet Take 1 tablet (10 mg total) by mouth daily at 12 noon. 90 tablet 1   glucose blood (ACCU-CHEK GUIDE) test strip TEST ONCE DAILY 100 each 5   levothyroxine (SYNTHROID) 88 MCG tablet Take  1 tablet (88 mcg total) by mouth daily. 90 tablet 3   Magnesium 250 MG TABS Take 250 mg by mouth at bedtime.     metFORMIN (GLUCOPHAGE) 500 MG tablet Take  1 tablet (500 mg total) by mouth daily with breakfast. 90 tablet 3   metoprolol tartrate (LOPRESSOR) 25 MG tablet TAKE 1 TABLET(25 MG) BY MOUTH TWICE DAILY 180 tablet 3   Omega-3 Fatty Acids (FISH OIL) 1200 MG CAPS Take 1,200 mg by mouth in the morning and at bedtime.     pantoprazole (PROTONIX) 40 MG tablet Take 1 tablet (40 mg total) by mouth daily. 90 tablet 3   PARoxetine (PAXIL) 20 MG tablet Take 1 tablet (20 mg total) by mouth in the morning. 90 tablet 3   pioglitazone (ACTOS) 30 MG tablet TAKE 1 TABLET(30 MG) BY MOUTH DAILY 90 tablet 1   sitaGLIPtin (JANUVIA) 100 MG tablet Take 1 tablet (100 mg total) by mouth daily. 90 tablet 3   valACYclovir (VALTREX) 1000 MG tablet Take 1 tablet (1,000 mg total) by mouth daily as needed (Fever Blisters). 180 tablet 0   vitamin B-12 (CYANOCOBALAMIN) 500 MCG tablet Take 1,000 mcg by mouth daily with lunch.     losartan (COZAAR) 50 MG tablet Take 2 tablets (100 mg total) by mouth daily. 60 tablet 1   furosemide (LASIX) 40 MG tablet Take 40 mg by mouth daily. (Patient not taking: Reported on 07/26/2020)     melatonin 3 MG TABS tablet Take 3 mg by mouth at bedtime. (Patient not taking: No sig reported)     potassium chloride SA (KLOR-CON) 20 MEQ tablet Take 1 tablet (20 mEq total) by mouth daily. 3 tablet 0   sennosides-docusate sodium (SENOKOT-S) 8.6-50 MG tablet Take 1 tablet by mouth daily. 90 tablet 0   No facility-administered medications prior to visit.    Allergies  Allergen Reactions   Codeine Nausea And Vomiting and Other (See Comments)    Severe stomach cramps   Antihistamines, Diphenhydramine-Type Other (See Comments)    Causes hyperactivity   Gabapentin     Urinary incontinence   Statins Other (See Comments)    Joint pains   Sulfa Antibiotics     Unknown to pt   Erythromycin Hives and Rash    Due to dental work about 50 years ago. Was used in packing and resulted in rash/hives inside and outside of mouth.    ROS Review of Systems   Constitutional: Negative.   HENT: Negative.    Eyes:  Positive for discharge and visual disturbance. Negative for photophobia and redness.  Respiratory: Negative.    Cardiovascular: Negative.   Gastrointestinal: Negative.   Neurological:  Positive for headaches (when bp is elevated.).  Psychiatric/Behavioral: Negative.       Objective:    Physical Exam Vitals and nursing note reviewed.  Constitutional:      General: She is not in acute distress.    Appearance: Normal appearance. She is not ill-appearing, toxic-appearing or diaphoretic.  HENT:     Head: Normocephalic and atraumatic.     Right Ear: External ear normal.     Left Ear: External ear normal.  Eyes:     General: No scleral icterus.       Right eye: No discharge.        Left eye: No discharge.     Extraocular Movements: Extraocular movements intact.     Conjunctiva/sclera: Conjunctivae normal.     Pupils: Pupils are equal, round, and reactive to light.  Cardiovascular:     Rate and Rhythm: Normal rate and regular rhythm.  Pulmonary:     Effort: Pulmonary effort is normal.     Breath sounds: Normal breath sounds.  Musculoskeletal:     Cervical back: No rigidity or tenderness.  Lymphadenopathy:     Cervical: No cervical adenopathy.  Neurological:     Mental Status: She is alert.    BP (!) 148/68   Pulse 70   Temp (!) 97 F (36.1 C) (Temporal)   Ht '5\' 3"'  (1.6 m)   Wt 150 lb 6.4 oz (68.2 kg)   SpO2 96%   BMI 26.64 kg/m  Wt Readings from Last 3 Encounters:  07/26/20 150 lb 6.4 oz (68.2 kg)  07/20/20 146 lb 12.8 oz (66.6 kg)  05/30/20 147 lb (66.7 kg)     Health Maintenance Due  Topic Date Due   Hepatitis C Screening  Never done   TETANUS/TDAP  Never done   Zoster Vaccines- Shingrix (2 of 2) 03/29/2011   FOOT EXAM  03/18/2019    There are no preventive care reminders to display for this patient.  Lab Results  Component Value Date   TSH 2.44 06/23/2019   Lab Results  Component Value Date    WBC 12.5 (H) 05/09/2020   HGB 10.8 (L) 05/09/2020   HCT 33.8 (L) 05/09/2020   MCV 94.7 05/09/2020   PLT 221 05/09/2020   Lab Results  Component Value Date   NA 138 05/09/2020   K 3.7 05/09/2020   CHLORIDE 104 02/21/2016   CO2 29 05/09/2020   GLUCOSE 69 (L) 05/09/2020   BUN 33 (H) 05/09/2020   CREATININE 1.64 (H) 05/09/2020   BILITOT 0.5 07/20/2020   ALKPHOS 63 07/20/2020   AST 16 07/20/2020   ALT 9 07/20/2020   PROT 7.0 07/20/2020   ALBUMIN 4.0 07/20/2020   CALCIUM 8.8 (L) 05/09/2020   ANIONGAP 8 05/09/2020   EGFR 29 (L) 03/25/2020   GFR 31.26 (L) 11/20/2019   Lab Results  Component Value Date   CHOL 195 07/20/2020   Lab Results  Component Value Date   HDL 36.20 (L) 07/20/2020   Lab Results  Component Value Date   LDLCALC 83 05/13/2008   Lab Results  Component Value Date   TRIG 236.0 (H) 07/20/2020   Lab Results  Component Value Date   CHOLHDL 5 07/20/2020   Lab Results  Component Value Date   HGBA1C 7.7 (H) 07/20/2020      Assessment & Plan:   Problem List Items Addressed This Visit       Cardiovascular and Mediastinum   Primary hypertension   Relevant Medications   losartan (COZAAR) 25 MG tablet     Endocrine   Type 2 diabetes mellitus with diabetic neuropathy, without long-term current use of insulin (HCC) - Primary   Relevant Medications   losartan (COZAAR) 25 MG tablet     Other   HYPERCHOLESTEROLEMIA (Chronic)   Relevant Medications   losartan (COZAAR) 25 MG tablet   Irritation of eyelid   Relevant Orders   Ambulatory referral to Ophthalmology    Meds ordered this encounter  Medications   losartan (COZAAR) 25 MG tablet    Sig: Take 3 tablets daily    Dispense:  90 tablet    Refill:  2    Follow-up: Return in about 1 month (around 08/26/2020), or and or follow up with cardiology..  Have increased losartan to 75 mg daily or 3 25 mg pills daily.  She has follow-up scheduled with cardiology next month.  She can follow-up here or  there see how her blood pressure is doing on the higher dose.  She will continue Repatha as she is unable to tolerate statins.  Diabetes is well controlled currently.  Urgent referral to ophthalmology for irritation of left eyelid.  Libby Maw, MD

## 2020-07-27 MED ORDER — SITAGLIPTIN PHOSPHATE 100 MG PO TABS: 100.0000 mg | ORAL_TABLET | Freq: Every day | ORAL | 3 refills | Status: DC

## 2020-07-27 NOTE — Telephone Encounter (Signed)
Last ov 07/26/20

## 2020-07-30 ENCOUNTER — Other Ambulatory Visit: Payer: Self-pay | Admitting: Family Medicine

## 2020-08-01 NOTE — Telephone Encounter (Signed)
Last OV 07/26/20 Last fill 02/10/20  #90/1

## 2020-08-19 ENCOUNTER — Telehealth: Payer: Self-pay | Admitting: Cardiovascular Disease

## 2020-08-19 NOTE — Telephone Encounter (Signed)
Pt c/o medication issue:  1. Name of Medication: Repatha and Furosemide  2. How are you currently taking this medication (dosage and times per day)? Repathat every 2 weeks- Furosemide Monday, Wednesday and Friday  3. Are you having a reaction (difficulty breathing--STAT)? no  4. What is your medication issue? Bothering her eyes- extremely dry eyes, runny nose,  and have gained weight  from the Furosemide

## 2020-08-19 NOTE — Progress Notes (Signed)
Cardiology Office Note:    Date:  08/24/2020   ID:  Takyia, Sindt 1941-01-07, MRN [id number]  PCP:  Ronnald Nian, DO   [facility] Providers Cardiologist:  [name], MD     Patient Profile:    [name] is a 80 y.o. female with:  Coronary artery disease  Aortic stenosis S/p DES to RCA in 2007 Myoview 2017 low risk  S/p CABG + AVR 4/22 Hypertension  Diabetes mellitus  Chronic kidney disease  Hyperlipidemia  Breast CA  Peripheral arterial disease  S/p R SFA stent GERD Hypothyroidism  Carotid artery disease  S/p carotid stent in 2007 [place] 4/22: Bilat 1-39  Prior CV studies: Pre-CABG Dopplers 05/02/20 Bilateral ICA 1-39   RIGHT/LEFT HEART CATH AND CORONARY ANGIOGRAPHY 03/17/2020 Narrative  Ost RCA to Prox RCA lesion is 80% stenosed.  Mid RCA lesion is 30% stenosed.  RPDA lesion is 60% stenosed.  1st Mrg lesion is 50% stenosed.  Prox Cx to Mid Cx lesion is 80% stenosed.  Mid LAD lesion is 80% stenosed. 1. No significant disease in the left main artery 2. Severe mid LAD stenosis. The LAD bifurcated distally beyond this lesion and the diagonal branch reaches the apex. 3. Moderate caliber non-dominant Circumflex with severe mid stenosis. The vessel is relatively small beyond the stenosis. The small to moderate caliber obtuse marginal branch arises prior to the stenosis and has moderate ostial stenosis. 4. The RCA is a large dominant artery. There is severe, heavily calcified stenosis at the ostium of the RCA. The distal stented segment is patent. There is moderately severe stenosis in the moderate caliber PDA. 5. Severe aortic stenosis (mean gradient 26.9 mmHg, peak gradient 34 mmHg)  Echocardiogram 02/29/20 Moderate-severe aortic stenosis, V-max 3.5 m/s, mean gradient 33 mmHg, DI 0.26, moderate MR, EF 65-70, no RWMA, moderate LVH, normal RVSF  GATED SPECT MYO PERF W/EXERCISE STRESS 1D 11/22/2015 Narrative  Nuclear stress EF: 59%. Normal wall  motion  There was no ST segment deviation noted during stress.  This is a low risk study. No perfusion defects, no ischemia   HPI:   80 y.o. with CAD and now post CABG/AVR  03/17/20 Cardiac catheterization demonstrated severe disease in the LAD and RCA.  There was also severe disease in a very small LCx.  She was referred to Dr. Roxy Manns for CABG+AVR. She was admitted 4/13-4/18 and underwent CABG (L-LAD, S-PDA) + bioprosthetic AVR.  Circumflex / OM  too small to graft The post op course was fairly uneventful.  She returns for f/u.  She has felt poorly She stopped her repatha and lasix on her own   Post op TTE 06/15/20 with EF 60-65% moderate MR normal AVR with no AR and mean gradient 4 peak 6.7 mmHg DVI 0.56.    Last Hct was 33.8 she did receive a transfusion post op BUN 33 / Cr 1.64 On 05/09/20   She has a multitude of somatic complaints. Dry eyes not related to lasix Has seen eye doctor and nothing found Told to use restasis. She has more pain in her legs. She did not like Dr Carlis Abbott who did her stents before She has Had some dyspnea as well No volume overload. 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   Arthritis    Breast cancer ([place]) 12/05/10   R breast, inv mammary, in situ,ER/PR +,HER2 -   Cancer ([place])    Carcinoma of breast treated with adjuvant chemotherapy ([place])    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[place])    Claudication ([place])    Coronary artery disease    stent 2007   Depression    Diabetes mellitus    Difficulty sleeping    Diverticulosis 12/10/03   Elevated cholesterol    Generalized weakness    GERD (gastroesophageal reflux disease)    Heart murmur    Hepatitis    as an infant   HSV-1 (herpes simplex virus 1) infection    Hyperlipidemia    Hypertension    Hypothyroidism    Osteoarthritis    PAD  (peripheral artery disease) ([place])    Personal history of chemotherapy    Personal history of radiation therapy    Pneumonia    2014 september and March 2022   Rheumatoid arthritis ([place])    S/P aortic valve replacement with bioprosthetic valve 05/04/2020   Edwards Inspiris Resilia stented bovine pericardial tissue valve, size 23 mm   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   LIMA to D3, SVG to PDA, EVH via right thigh   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   aspirin EC 81 MG tablet Take 81 mg by mouth at bedtime.   calcium carbonate (TUMS - DOSED IN MG ELEMENTAL CALCIUM) 500 MG chewable tablet Chew 2 tablets by mouth 3 (three) times daily as needed for indigestion or heartburn.   Cholecalciferol (VITAMIN D3) 25 MCG (1000 UT) CAPS Take 1,000 Units by mouth daily before lunch.   glipiZIDE (GLUCOTROL XL) 10 MG 24 hr tablet TAKE 1 TABLET BY MOUTH  DAILY AT 12 NOON   glucose blood (ACCU-CHEK GUIDE) test strip TEST ONCE DAILY   Golimumab (SIMPONI ARIA IV) Inject into the vein.   levothyroxine (SYNTHROID) 88 MCG tablet TAKE 1 TABLET BY MOUTH  DAILY   losartan (COZAAR) 25 MG tablet Take 3 tablets daily   Magnesium 250 MG TABS Take 250 mg by mouth at bedtime.   metFORMIN (GLUCOPHAGE) 500 MG tablet Take 1 tablet (500 mg total) by mouth daily with breakfast.   metoprolol tartrate (LOPRESSOR) 25 MG tablet TAKE 1 TABLET(25 MG) BY MOUTH TWICE DAILY   Omega-3 Fatty Acids (FISH OIL) 1200 MG CAPS Take 1,200 mg by mouth in the morning and at bedtime.   pantoprazole (PROTONIX) 40 MG tablet Take 1 tablet (40 mg total) by mouth daily.   PARoxetine (PAXIL) 20 MG tablet Take 1 tablet (20 mg total) by mouth in the morning.    pioglitazone (ACTOS) 30 MG tablet TAKE 1 TABLET(30 MG) BY MOUTH DAILY   sitaGLIPtin (JANUVIA) 100 MG tablet Take 1 tablet (100 mg total) by mouth daily.   valACYclovir (VALTREX) 1000 MG tablet Take 1 tablet (1,000 mg total) by mouth daily as needed (Fever Blisters).     Allergies:   Codeine; Antihistamines, diphenhydramine-type; Gabapentin; Statins; Sulfa antibiotics; and Erythromycin   Social History   Tobacco Use   Smoking status:  Former    Packs/day: 2.00    Years: 40.00    Pack years: 80.00    Types: Cigarettes    Quit date: 12/19/1997    Years since quitting: 22.6   Smokeless tobacco: Never  Vaping Use   Vaping Use: Never used  Substance Use Topics   Alcohol use: Yes    Alcohol/week: 1.0 standard drink    Types: 1 Glasses of wine per week    Comment: occasiona - maybe once a month   Drug use: No     Family Hx: The patient's family history includes Alcoholism in her father; Cancer in her son; Diabetes in her brother; Heart attack in her brother and father; Heart disease in her mother; Hypertension in her brother; Lymphoma (age of onset: 52) in her brother; Throat cancer in her father. There is no history of Colon cancer, Stroke, Esophageal cancer, Rectal cancer, or Stomach cancer.  Review of Systems  Constitutional: Negative for decreased appetite and fever.  Respiratory:  Negative for cough.     EKGs/Labs/Other Test Reviewed:    EKG:   SR LVH with strain ? Old IMI 05/30/20   Recent Labs: 03/04/2020: NT-Pro BNP 897 05/05/2020: Magnesium 2.2 05/09/2020: BUN 33; Creatinine, Ser 1.64; Hemoglobin 10.8; Platelets 221; Potassium 3.7; Sodium 138 07/20/2020: ALT 9   Recent Lipid Panel Lab Results  Component Value Date/Time   CHOL 195 07/20/2020 09:08 AM   TRIG 236.0 (H) 07/20/2020 09:08 AM   HDL 36.20 (L) 07/20/2020 09:08 AM   CHOLHDL 5 07/20/2020 09:08 AM   LDLCALC 83 05/13/2008 09:29 AM   LDLDIRECT 113.0 07/20/2020 09:08 AM      Risk Assessment/Calculations:       Physical Exam:    VS:  BP 118/64   Pulse 65   Ht _0  (1.6 m)   Wt 68 kg   SpO2 98%   BMI 26.57 kg/m     Wt Readings from Last 3 Encounters:  08/24/20 68 kg  07/26/20 68.2 kg  07/20/20 66.6 kg     Affect appropriate Healthy:  appears stated age HEENT: normal Neck supple with no adenopathy JVP normal no bruits no thyromegaly Lungs clear with no wheezing and good diaphragmatic motion Heart:  S1/S2 SEM through AVR no AR  murmur, no rub, gallop or click PMI normal post sternotomy  Abdomen: benighn, BS positve, no tenderness, no AAA no bruit.  No HSM or HJR Distal pulses intact with no bruits No edema Neuro non-focal Skin warm and dry No muscular weakness       ASSESSMENT & PLAN:    CAD/CABG:  05/04/20 LIMA to Diagonal and SVG to PdA continue ASA And beta blocker AVR:  bicuspid pathology post 23 mm Edwards Inspiris Resilia stented bovine pericardial tissue valve Post op echo 06/15/20 normal function  HLD:  Intolerant to statins Felt poorly on Repatha and discontinued  will observe for now  DM:  Discussed low carb diet.  Target hemoglobin A1c is 6.5 or less.  Continue current medications. HTN:  Well controlled.  Continue current medications and low sodium Dash type diet.   Thyroid : on synthroid replacement check labs  PVD:  Previous right SFA stenting ABI"s 0.74 bilaterally 05/02/20 prior to AVR/CABG    Dispo:   F/U 6 months   Medication Adjustments/Labs and Tests Ordered: Current medicines are reviewed at length with the patient today.  Concerns regarding medicines are outlined above.  Tests Ordered: No orders of the defined types were placed in this  encounter. CBC, BMET , TSH BNP ABI with LE arterial duplex   Medication Changes: No orders of the defined types were placed in this encounter.  None   Signed, [name], MD  08/24/2020 9:37 AM    [facility] [place], [place], [place]  [address] Phone: [phone]; Fax: [phone]

## 2020-08-19 NOTE — Telephone Encounter (Signed)
Cold and Flu like symptoms are a common side effect after first starting Repatha.  Can include runny nose, sore throat, and cough.  Typically resolves a few days after starting medication.

## 2020-08-19 NOTE — Telephone Encounter (Signed)
Patient stated she has stopped her lasix and her repatha. Patient stated the lasix had too many side effects: dry eyes, gaining weight, and runny nose. Patient stated she felt great right after surgery and then when she started taking repatha, she just felt awful. Patient stated she has a lot of questions for Dr. Johnsie Cancel and things she would like to discuss. Made patient an appointment to see Dr. Johnsie Cancel next week. Will send message to Pharm D and Dr. Johnsie Cancel to let them know patient has stopped these medications.

## 2020-08-24 ENCOUNTER — Other Ambulatory Visit: Payer: Self-pay | Admitting: Family Medicine

## 2020-08-24 ENCOUNTER — Other Ambulatory Visit: Payer: Self-pay

## 2020-08-24 ENCOUNTER — Ambulatory Visit: Payer: Medicare Other

## 2020-08-24 ENCOUNTER — Encounter: Payer: Self-pay | Admitting: Cardiovascular Disease

## 2020-08-24 ENCOUNTER — Ambulatory Visit (INDEPENDENT_AMBULATORY_CARE_PROVIDER_SITE_OTHER): Payer: Medicare Other | Admitting: Cardiovascular Disease

## 2020-08-24 VITALS — BP 118/64 | HR 65 | Ht 63.0 in | Wt 150.0 lb

## 2020-08-24 DIAGNOSIS — Z79899 Other long term (current) drug therapy: Secondary | ICD-10-CM | POA: Diagnosis not present

## 2020-08-24 DIAGNOSIS — I10 Essential (primary) hypertension: Secondary | ICD-10-CM

## 2020-08-24 DIAGNOSIS — I739 Peripheral vascular disease, unspecified: Secondary | ICD-10-CM

## 2020-08-24 DIAGNOSIS — Z953 Presence of xenogenic heart valve: Secondary | ICD-10-CM

## 2020-08-24 DIAGNOSIS — E7800 Pure hypercholesterolemia, unspecified: Secondary | ICD-10-CM | POA: Diagnosis not present

## 2020-08-24 NOTE — Patient Instructions (Addendum)
Medication Instructions:  *If you need a refill on your cardiac medications before your next appointment, please call your pharmacy*  Lab Work: Your physician recommends that you have lab work today, TSH, BMET, BNP and CBC.  If you have labs (blood work) drawn today and your tests are completely normal, you will receive your results only by: [place] (if you have MyChart) OR A paper copy in the mail If you have any lab test that is abnormal or we need to change your treatment, we will call you to review the results.  Testing/Procedures: Your physician has requested that you have a lower extremity arterial duplex with ABI's. During this test, exercise and ultrasound are used to evaluate arterial blood flow in the legs. Allow one hour for this exam. There are no restrictions or special instructions.  Follow-Up: At [facility], you and your health needs are our priority.  As part of our continuing mission to provide you with exceptional heart care, we have created designated Provider Care Teams.  These Care Teams include your primary Cardiologist (physician) and Advanced Practice Providers (APPs -  Physician Assistants and Nurse Practitioners) who all work together to provide you with the care you need, when you need it.  We recommend signing up for the patient portal called "MyChart".  Sign up information is provided on this After Visit Summary.  MyChart is used to connect with patients for Virtual Visits (Telemedicine).  Patients are able to view lab/test results, encounter notes, upcoming appointments, etc.  Non-urgent messages can be sent to your provider as well.   To learn more about what you can do with MyChart, go to [email or web address].    Your next appointment:   6 month(s)  The format for your next appointment:   In Person  Provider:   You may see [name], MD or one of the following Advanced Practice Providers on your designated Care Team:   Cecilie Kicks, NP

## 2020-08-25 ENCOUNTER — Telehealth: Payer: Self-pay

## 2020-08-25 DIAGNOSIS — Z79899 Other long term (current) drug therapy: Secondary | ICD-10-CM

## 2020-08-25 DIAGNOSIS — R7989 Other specified abnormal findings of blood chemistry: Secondary | ICD-10-CM

## 2020-08-25 LAB — CBC WITH DIFFERENTIAL/PLATELET
Basophils Absolute: 0.1 10*3/uL (ref 0.0–0.2)
Basos: 1 %
EOS (ABSOLUTE): 0.5 10*3/uL — ABNORMAL HIGH (ref 0.0–0.4)
Eos: 8 %
Hematocrit: 38.8 % (ref 34.0–46.6)
Hemoglobin: 12.7 g/dL (ref 11.1–15.9)
Immature Grans (Abs): 0 10*3/uL (ref 0.0–0.1)
Immature Granulocytes: 0 %
Lymphocytes Absolute: 2.6 10*3/uL (ref 0.7–3.1)
Lymphs: 43 %
MCH: 28.9 pg (ref 26.6–33.0)
MCHC: 32.7 g/dL (ref 31.5–35.7)
MCV: 88 fL (ref 79–97)
Monocytes Absolute: 0.7 10*3/uL (ref 0.1–0.9)
Monocytes: 12 %
Neutrophils Absolute: 2.1 10*3/uL (ref 1.4–7.0)
Neutrophils: 36 %
Platelets: 226 10*3/uL (ref 150–450)
RBC: 4.39 x10E6/uL (ref 3.77–5.28)
RDW: 13.3 % (ref 11.7–15.4)
WBC: 5.9 10*3/uL (ref 3.4–10.8)

## 2020-08-25 LAB — BASIC METABOLIC PANEL
BUN/Creatinine Ratio: 18 (ref 12–28)
BUN: 31 mg/dL — ABNORMAL HIGH (ref 8–27)
CO2: 23 mmol/L (ref 20–29)
Calcium: 9.5 mg/dL (ref 8.7–10.3)
Chloride: 98 mmol/L (ref 96–106)
Creatinine, Ser: 1.71 mg/dL — ABNORMAL HIGH (ref 0.57–1.00)
Glucose: 231 mg/dL — ABNORMAL HIGH (ref 65–99)
Potassium: 4.7 mmol/L (ref 3.5–5.2)
Sodium: 136 mmol/L (ref 134–144)
eGFR: 30 mL/min/{1.73_m2} — ABNORMAL LOW (ref >59)

## 2020-08-25 LAB — PRO B NATRIURETIC PEPTIDE: NT-Pro BNP: 2246 pg/mL — ABNORMAL HIGH (ref 0–738)

## 2020-08-25 LAB — TSH: TSH: 10.6 u[IU]/mL — ABNORMAL HIGH (ref 0.450–4.500)

## 2020-08-25 NOTE — Telephone Encounter (Signed)
Consulted DOD, Dr. Gasper Sells about elevated BNP, he recommend to recheck lab work in one week to see if numbers improve. Patient has been off of Lasix for a week and off Repatha for 2 weeks. Patient denies any SOB and stated she has a little swelling, but nothing major. Informed patient that Dr. Johnsie Cancel will review lab results when he is back in the office and we will call her if there are any additional instructions. Patient verbalized understanding and will come in on 09/01/20 for lab work.

## 2020-08-29 ENCOUNTER — Telehealth: Payer: Self-pay | Admitting: Family Medicine

## 2020-08-29 NOTE — Telephone Encounter (Signed)
What is the name of the medication? glipiZIDE (GLUCOTROL XL) 10 MG 24 hr tablet [[id number]]   Have you contacted your pharmacy to request a refill? [facility] saying [facility] has no glip[facility]ide in stock. She has 4 pills left. She has a toc scheduled with [facility] on 01/02/21  Which pharmacy would you like this sent to? [facility] at [address]   Patient notified that their request is being sent to the clinical staff for review and that they should receive a call once it is complete. If they do not receive a call within 72 hours they can check with their pharmacy or our office.

## 2020-08-30 MED ORDER — GLIPIZIDE ER 10 MG PO TB24: ORAL_TABLET | ORAL | 0 refills | Status: DC

## 2020-08-30 NOTE — Telephone Encounter (Signed)
Spoke to patient an advised that rx will be sent to [facility].  Dm/cma

## 2020-08-31 ENCOUNTER — Other Ambulatory Visit: Payer: Medicare Other

## 2020-09-01 ENCOUNTER — Other Ambulatory Visit: Payer: Medicare Other | Admitting: *Deleted

## 2020-09-01 ENCOUNTER — Other Ambulatory Visit: Payer: Self-pay

## 2020-09-01 DIAGNOSIS — Z79899 Other long term (current) drug therapy: Secondary | ICD-10-CM

## 2020-09-01 DIAGNOSIS — R7989 Other specified abnormal findings of blood chemistry: Secondary | ICD-10-CM

## 2020-09-02 ENCOUNTER — Telehealth: Payer: Self-pay | Admitting: Cardiovascular Disease

## 2020-09-02 LAB — BASIC METABOLIC PANEL
BUN/Creatinine Ratio: 17 (ref 12–28)
BUN: 30 mg/dL — ABNORMAL HIGH (ref 8–27)
CO2: 23 mmol/L (ref 20–29)
Calcium: 9.3 mg/dL (ref 8.7–10.3)
Chloride: 101 mmol/L (ref 96–106)
Creatinine, Ser: 1.79 mg/dL — ABNORMAL HIGH (ref 0.57–1.00)
Glucose: 185 mg/dL — ABNORMAL HIGH (ref 65–99)
Potassium: 5.3 mmol/L — ABNORMAL HIGH (ref 3.5–5.2)
Sodium: 139 mmol/L (ref 134–144)
eGFR: 29 mL/min/{1.73_m2} — ABNORMAL LOW (ref >59)

## 2020-09-02 LAB — PRO B NATRIURETIC PEPTIDE: NT-Pro BNP: 2950 pg/mL — ABNORMAL HIGH (ref 0–738)

## 2020-09-02 NOTE — Telephone Encounter (Signed)
Patient called regards to her lab work. Please advise

## 2020-09-02 NOTE — Telephone Encounter (Signed)
Patient is calling about her,  "fluid level lab".  Advised that we did not have the result yet for that lab. We will contact her when we have more information.  Verbalized agreement and understanding.

## 2020-09-05 ENCOUNTER — Telehealth: Payer: Self-pay | Admitting: Cardiovascular Disease

## 2020-09-05 MED ORDER — FUROSEMIDE 20 MG PO TABS: 20.0000 mg | ORAL_TABLET | Freq: Every day | ORAL | 3 refills | Status: DC

## 2020-09-05 NOTE — Telephone Encounter (Signed)
Dr. Johnsie Cancel reviewed labs. Recommend patient to restart Lasix 20 mg by mouth daily, and to follow up in 4 weeks. Patient agreed to plan.

## 2020-09-05 NOTE — Telephone Encounter (Signed)
[name] is calling requesting her lab results from last week. Please advise.

## 2020-09-05 NOTE — Telephone Encounter (Signed)
Patient aware of results of lab work and changes, see previous phone encounter.

## 2020-09-06 ENCOUNTER — Ambulatory Visit (INDEPENDENT_AMBULATORY_CARE_PROVIDER_SITE_OTHER): Payer: Medicare Other | Admitting: *Deleted

## 2020-09-06 DIAGNOSIS — Z0000 Encounter for general adult medical examination without abnormal findings: Secondary | ICD-10-CM | POA: Diagnosis not present

## 2020-09-06 NOTE — Progress Notes (Signed)
Subjective:   [name] is a 80 y.o. female who presents for Medicare Annual (Subsequent) preventive examination.  I connected with  [name] on 09/06/20 by a telephone  enabled telemedicine application and verified that I am speaking with the correct person using two identifiers.   I discussed the limitations of evaluation and management by telemedicine. The patient expressed understanding and agreed to proceed.   Review of Systems    na Cardiac Risk Factors include: advanced age (>16mn, >>50women);diabetes mellitus;family history of premature cardiovascular disease;Other (see comment), Risk factor comments: leg  swelling     Objective:    Today's Vitals   There is no height or weight on file to calculate BMI.  Advanced Directives 05/04/2020 05/02/2020 03/17/2020 08/19/2019 07/01/2019 04/29/2019 04/05/2016  Does Patient Have a Medical Advance Directive? Yes Yes Yes Yes Yes Yes -  Type of AParamedicof [place]olaLiving will [place]uckhead RidgeLiving will Living will;Healthcare Power of ASt. ClairsvilleLiving will [place]rentonLiving will [place]endersonvilleLiving will -  Does patient want to make changes to medical advance directive? No - Patient declined No - Patient declined No - Patient declined - No - Patient declined No - Patient declined No - Patient declined  Copy of [place] Chart? Yes - validated most recent copy scanned in chart (See row information) Yes - validated most recent copy scanned in chart (See row information) No - copy requested Yes - validated most recent copy scanned in chart (See row information) No - copy requested No - copy requested -    Current Medications (verified) Outpatient Encounter Medications as of 09/06/2020  Medication Sig   Accu-Chek FastClix Lancets MISC USE TO CHECK BLOOD SUGARS 3 TIMES DAILY   acetaminophen (TYLENOL) 500 MG tablet Take  1,000 mg by mouth every 6 (six) hours as needed for moderate pain or headache.   aspirin EC 81 MG tablet Take 81 mg by mouth at bedtime.   calcium carbonate (TUMS - DOSED IN MG ELEMENTAL CALCIUM) 500 MG chewable tablet Chew 2 tablets by mouth 3 (three) times daily as needed for indigestion or heartburn.   Cholecalciferol (VITAMIN D3) 25 MCG (1000 UT) CAPS Take 1,000 Units by mouth daily before lunch.   furosemide (LASIX) 20 MG tablet Take 1 tablet (20 mg total) by mouth daily.   glipiZIDE (GLUCOTROL XL) 10 MG 24 hr tablet TAKE 1 TABLET BY MOUTH  DAILY AT 12 NOON   glucose blood (ACCU-CHEK GUIDE) test strip TEST ONCE DAILY   Golimumab (SIMPONI ARIA IV) Inject into the vein.   levothyroxine (SYNTHROID) 88 MCG tablet TAKE 1 TABLET BY MOUTH  DAILY   losartan (COZAAR) 50 MG tablet Take 50 mg by mouth 2 (two) times daily.   Magnesium 250 MG TABS Take 250 mg by mouth at bedtime.   metFORMIN (GLUCOPHAGE) 500 MG tablet Take 1 tablet (500 mg total) by mouth daily with breakfast.   metoprolol tartrate (LOPRESSOR) 25 MG tablet TAKE 1 TABLET(25 MG) BY MOUTH TWICE DAILY   Omega-3 Fatty Acids (FISH OIL) 1200 MG CAPS Take 1,200 mg by mouth in the morning and at bedtime.   pantoprazole (PROTONIX) 40 MG tablet Take 1 tablet (40 mg total) by mouth daily.   PARoxetine (PAXIL) 20 MG tablet Take 1 tablet (20 mg total) by mouth in the morning.   pioglitazone (ACTOS) 30 MG tablet TAKE 1 TABLET(30 MG) BY MOUTH DAILY  sitaGLIPtin (JANUVIA) 100 MG tablet Take 1 tablet (100 mg total) by mouth daily.   valACYclovir (VALTREX) 1000 MG tablet Take 1 tablet (1,000 mg total) by mouth daily as needed (Fever Blisters).   No facility-administered encounter medications on file as of 09/06/2020.    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[place])    Claudication ([place])    Coronary artery disease    stent 2007   Depression    Diabetes mellitus    Difficulty sleeping    Diverticulosis 12/10/03   Elevated cholesterol    Generalized weakness    GERD (gastroesophageal reflux disease)    Heart murmur    Hepatitis    as an infant   HSV-1 (herpes simplex virus 1) infection    Hyperlipidemia    Hypertension    Hypothyroidism    Osteoarthritis    PAD (peripheral artery disease) ([place])    Personal history of chemotherapy    Personal history of radiation therapy    Pneumonia    2014 september and March 2022   Rheumatoid arthritis ([place])    S/P aortic valve replacement with bioprosthetic valve 05/04/2020   [facility] Inspiris Resilia stented bovine pericardial tissue valve, size 23 mm   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   LIMA to D3, SVG to PDA, EVH via right thigh   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   Dr [name]   Spinal stenosis    Thyroid disease    Past Surgical History:  Procedure Laterality Date   ABDOMINAL AORTOGRAM W/LOWER EXTREMITY Bilateral 04/29/2019   Procedure: ABDOMINAL AORTOGRAM W/LOWER EXTREMITY;  Surgeon: Marty Heck, MD;  Location: [facility];  Service: Cardiovascular;  Laterality: Bilateral;   ABDOMINAL AORTOGRAM W/LOWER EXTREMITY Left 07/01/2019   Procedure: ABDOMINAL AORTOGRAM W/ Left LOWER EXTREMITY [place];  Surgeon: Marty Heck, MD;  Location: [facility];  Service: Cardiovascular;  Laterality: Left;   ANTERIOR AND POSTERIOR REPAIR N/A 04/05/2016   Procedure: ANTERIOR (CYSTOCELE) AND POSTERIOR REPAIR (RECTOCELE);  Surgeon: [name], MD;  Location: [facility];  Service: Gynecology;  Laterality: N/A;   AORTIC VALVE  REPLACEMENT N/A 05/04/2020   Procedure: AORTIC VALVE REPLACEMENT (AVR) USING INSPIRIS 23MM RESILIA AORTIC VALVE;  Surgeon: Rexene Alberts, MD;  Location: [facility];  Service: Open Heart Surgery;  Laterality: N/A;   BREAST BIOPSY     BREAST LUMPECTOMY  1983   benign biopsy   BREAST LUMPECTOMY Right 12/29/2010   snbx, ER/PR +, Her2 -, 0/1 node pos.   CARPAL TUNNEL RELEASE Bilateral 9/12,8,12   CHOLECYSTECTOMY     COLONOSCOPY N/A 02/09/2013   Procedure: COLONOSCOPY;  Surgeon: [name], MD;  [facility]: [facility];  Service: Endoscopy;  Laterality: N/A;   COLONOSCOPY     CORONARY ANGIOPLASTY     CORONARY ARTERY BYPASS GRAFT N/A 05/04/2020   Procedure: CORONARY ARTERY BYPASS GRAFTING (CABG), ON PUMP, TIMES TWO, USING LEFT INTERNAL MAMMARY ARTERY AND RIGHT ENDOSCOPICALLY HARVESTED GREATER SAPHENOUS  VEIN;  Surgeon: Rexene Alberts, MD;  Location: [facility];  Service: Open Heart Surgery;  Laterality: N/A;   DILATION AND CURETTAGE OF UTERUS     ESOPHAGOGASTRODUODENOSCOPY N/A 02/09/2013   Procedure: ESOPHAGOGASTRODUODENOSCOPY (EGD);  Surgeon: [name], MD;  Location: [name];  Service: Endoscopy;  Laterality: N/A;   FOOT SPURS     HYSTEROSCOPY WITH D & C N/A 09/11/2012   Procedure: DILATATION AND CURETTAGE ;  Surgeon: [name], MD;  Location: [facility];  Service: Gynecology;  Laterality: N/A;   KNEE SURGERY  2007   LAPAROSCOPIC ASSISTED VAGINAL HYSTERECTOMY N/A 04/05/2016   Procedure: LAPAROSCOPIC ASSISTED VAGINAL HYSTERECTOMY possible BSO;  Surgeon: [name], MD;  Location: [facility];  Service: Gynecology;  Laterality: N/A;   LUMBAR LAMINECTOMY/DECOMPRESSION MICRODISCECTOMY N/A 10/20/2014   Procedure: MICRO LUMBER DECOMPRESSION L3-4 L4-5;  Surgeon: [name], MD;  [facility];  Service: Orthopedics;  Laterality: N/A;   LUMBAR LAMINECTOMY/DECOMPRESSION MICRODISCECTOMY Bilateral 10/13/2015   Procedure: MICRO LUMBAR DECOMPRESSION L5 - S1 AND REDO DECOMPRESSION L4 - L5 AND REMOVAL OF  FACET CYST L4 - L5 2 LEVELS;  Surgeon: [name], MD;  [facility];  Service: Orthopedics;  Laterality: Bilateral;   PERIPHERAL VASCULAR INTERVENTION Right 04/29/2019   Procedure: PERIPHERAL VASCULAR INTERVENTION;  Surgeon: Marty Heck, MD;  Location: [facility];  Service: Cardiovascular;  Laterality: Right;   PORT-A-CATH REMOVAL  04/17/2011   Procedure: REMOVAL PORT-A-CATH;  Surgeon: [name], MD;  Location: [facility];  Service: General;  Laterality: Left;   PORTACATH PLACEMENT  02/07/2011   Procedure: INSERTION PORT-A-CATH;  Surgeon: [name], MD;  [facility];  Service: General;  Laterality: N/A;   RIGHT/LEFT HEART CATH AND CORONARY ANGIOGRAPHY N/A 03/17/2020   Procedure: RIGHT/LEFT HEART CATH AND CORONARY ANGIOGRAPHY;  Surgeon: Burnell Blanks, MD;  Location: [facility];  Service: Cardiovascular;  Laterality: N/A;   TEE WITHOUT CARDIOVERSION N/A 05/04/2020   Procedure: TRANSESOPHAGEAL ECHOCARDIOGRAM (TEE);  Surgeon: Rexene Alberts, MD;  Location: [facility];  Service: Open Heart Surgery;  Laterality: N/A;   UPPER GASTROINTESTINAL ENDOSCOPY     Family History  Problem Relation Age of Onset   Throat cancer Father    Alcoholism Father    Heart attack Father    Heart disease Mother    Lymphoma Brother 57   Cancer Son    Heart attack Brother    Diabetes Brother    Hypertension Brother    Colon cancer Neg Hx    Stroke Neg Hx    Esophageal cancer Neg Hx    Rectal cancer Neg Hx    Stomach cancer Neg Hx    Social History   Socioeconomic History   Marital status: Married    Spouse name: Not on file   Number of children: 3   Years of education: Not on file   Highest education level: Not on file  Occupational History   Occupation: retired-bookkeeping  Tobacco Use   Smoking status: Former    Packs/day: 2.00    Years: 40.00    Pack years: 80.00    Types: Cigarettes    Quit date: 12/19/1997    Years since  quitting: 22.7   Smokeless tobacco: Never  Vaping Use   Vaping Use: Never used  Substance and Sexual Activity   Alcohol use: Yes    Alcohol/week: 1.0 standard drink    Types: 1 Glasses of wine per week    Comment: occasiona -  maybe once a month   Drug use: No   Sexual activity: Not on file    Comment: menarch age 30, P65, HRT X 10 YRS, MENOPAUSE MID 40'S  Other Topics Concern   Not on file  Social History Narrative   [place] age 16- [place] age 37  Power   Social Determinants of Radio broadcast assistant Strain: Low Risk    Difficulty of Paying Living Expenses: Not hard at all  Food Insecurity: No Food Insecurity   Worried About Charity fundraiser in the Last Year: Never true   Arboriculturist in the Last Year: Never true  Transportation Needs: No Transportation Needs   Lack of Transportation (Medical): No   Lack of Transportation (Non-Medical): No  Physical Activity: Insufficiently Active   Days of Exercise per Week: 2 days   Minutes of Exercise per Session: 20 min  Stress: No Stress Concern Present   Feeling of Stress : Only a little  Social Connections: Moderately Integrated   Frequency of Communication with Friends and Family: Three times a week   Frequency of Social Gatherings with Friends and Family: Twice a week   Attends Religious Services: More than 4 times per year   Active Member of [employer] or Organizations: No   Attends Music therapist: Never   Marital Status: Married    Tobacco Counseling Counseling given: Not Answered   Clinical Intake:  Pre-visit preparation completed: No  Pain : No/denies pain     Nutritional Risks: None Diabetes: Yes CBG done?: No Did pt. bring in CBG monitor from home?: No  How often do you need to have someone help you when you read instructions, pamphlets, or other written materials from your doctor or pharmacy?: 1 - Never  Diabetic?yes  Nutrition Risk Assessment:  Has the patient had  any N/V/D within the last 2 months?  No  Does the patient have any non-healing wounds?  No  Has the patient had any unintentional weight loss or weight gain?  Yes   Diabetes:  Is the patient diabetic?  No  If diabetic, was a CBG obtained today?  No  Did the patient bring in their glucometer from home?  No  How often do you monitor your CBG's? 2-3 x  month.   - Financial Strains and Diabetes Management:  Are you having any financial strains with the device, your supplies or your medication? No .  Does the patient want to be seen by Chronic Care Management for management of their diabetes?  No  Would the patient like to be referred to a Nutritionist or for Diabetic Management?  No   Diabetic Exams:  Diabetic Eye Exam: Completed appointment 09-13-2020 Dr. Alois Cliche. Overdue for diabetic eye exam. Pt has been advised about the importance in completing this exam. A referral has been placed today. Message sent to referral coordinator for scheduling purposes. Advised pt to expect a call from office referred to regarding appt.  Diabetic Foot Exam: Completed . Pt has been advised about the importance in completing this exam.  Interpreter Needed?: No  Information entered by :: Leroy Kennedy LPN   Activities of Daily Living In your present state of health, do you have any difficulty performing the following activities: 09/06/2020 05/02/2020  Hearing? N Y  Comment - no hearing aids  Vision? - N  Difficulty concentrating or making decisions? N N  Walking or climbing stairs? N Y  Dressing or bathing?  N N  Doing errands, shopping? N N  Preparing Food and eating ? N -  Using the Toilet? N -  In the past six months, have you accidently leaked urine? Y -  Do you have problems with loss of bowel control? N -  Managing your Medications? N -  Managing your Finances? N -  Some recent data might be hidden    Patient Care Team: Ronnald Nian, DO as PCP - General (Family Medicine) Josue Hector, MD as PCP - Cardiology (Cardiology)  Indicate any recent Medical Services you may have received from other than [facility] providers in the past year (date may be approximate).     Assessment:   This is a routine wellness examination for [name].  Hearing/Vision screen Hearing Screening - Comments:: Yes trouble hearing aids Will call when ready to put referral in Vision Screening - Comments:: Dr. Alois Cliche  Scheduled 09-13-2020  Dietary issues and exercise activities discussed: Current Exercise Habits: Home exercise routine, Type of exercise: walking, Time (Minutes): 20, Frequency (Times/Week): 2, Weekly Exercise (Minutes/Week): 40, Intensity: Mild   Goals Addressed             This Visit's Progress    Patient Stated       Continuing to try to walk        Depression Screen PHQ 2/9 Scores 09/06/2020 07/26/2020 08/19/2019 03/31/2018 03/04/2017  PHQ - 2 Score 0 0 0 0 0    Fall Risk Fall Risk  09/06/2020 07/26/2020 12/02/2019 08/19/2019 05/06/2019  Falls in the past year? 0 0 0 0 0  Number falls in past yr: 0 - - 0 -  Injury with Fall? 0 - - 0 -  Follow up Falls evaluation completed;Falls prevention discussed - - Falls prevention discussed -    FALL RISK PREVENTION PERTAINING TO THE HOME:  Any stairs in or around the home? No  If so, are there any without handrails? No  Home free of loose throw rugs in walkways, pet beds, electrical cords, etc? Yes  Adequate lighting in your home to reduce risk of falls? Yes   ASSISTIVE DEVICES UTILIZED TO PREVENT FALLS:  Life alert? No  Use of a cane, walker or w/c? No  Grab bars in the bathroom? Yes  Shower chair or bench in shower? No  Elevated toilet seat or a handicapped toilet? No   TIMED UP AND GO:  Was the test performed? No .    Cognitive Function:  Normal cognitive status assessed by direct observation by this Nurse Health Advisor. No abnormalities found.          Immunizations Immunization History  Administered  Date(s) Administered   Fluad Quad(high Dose 65+) 11/20/2018   Influenza Split 12/07/2014   Influenza, High Dose Seasonal PF 11/26/2016   Influenza,inj,quad, With Preservative 02/22/2017, 11/20/2018   Influenza-Unspecified 10/31/2009, 12/01/2010, 12/03/2011, 11/05/2012, 11/05/2013, 10/25/2014, 11/10/2015, 11/27/2017, 10/07/2019   PFIZER(Purple Top)SARS-COV-2 Vaccination 02/12/2019, 03/05/2019, 03/15/2020   Pneumococcal Conjugate-13 07/30/2014   Pneumococcal Polysaccharide-23 11/19/2007, 11/15/2008   Pneumococcal-Unspecified 01/23/2010   Zoster Recombinat (Shingrix) 02/01/2011    TDAP status: Due, Education has been provided regarding the importance of this vaccine. Advised may receive this vaccine at local pharmacy or Health Dept. Aware to provide a copy of the vaccination record if obtained from local pharmacy or Health Dept. Verbalized acceptance and understanding.  Flu Vaccine status: Up to date  Pneumococcal vaccine status: Up to date  Covid-19 vaccine status: Information provided on how to obtain vaccines.  Qualifies for Shingles Vaccine? Yes   Zostavax completed No   Shingrix Completed?: No.    Education has been provided regarding the importance of this vaccine. Patient has been advised to call insurance company to determine out of pocket expense if they have not yet received this vaccine. Advised may also receive vaccine at local pharmacy or Health Dept. Verbalized acceptance and understanding.  Screening Tests Health Maintenance  Topic Date Due   Hepatitis C Screening  Never done   TETANUS/TDAP  Never done   Zoster Vaccines- Shingrix (2 of 2) 03/29/2011   FOOT EXAM  03/18/2019   OPHTHALMOLOGY EXAM  08/19/2020   INFLUENZA VACCINE  08/22/2020   HEMOGLOBIN A1C  01/19/2021   DEXA SCAN  Completed   PNA vac Low Risk Adult  Completed   HPV VACCINES  Aged Out   COVID-19 Vaccine  Discontinued    Health Maintenance  Health Maintenance Due  Topic Date Due   Hepatitis C  Screening  Never done   TETANUS/TDAP  Never done   Zoster Vaccines- Shingrix (2 of 2) 03/29/2011   FOOT EXAM  03/18/2019   OPHTHALMOLOGY EXAM  08/19/2020   INFLUENZA VACCINE  08/22/2020    Colorectal cancer screening: Type of screening: Colonoscopy. Completed 2021. Repeat every 10 years  Mammogram status: Completed  . Repeat every year  Bone Density status: Completed 2021. Results reflect: Bone density results: OSTEOPOROSIS. Repeat every 2 years.  Lung Cancer Screening: (Low Dose CT Chest recommended if Age 49-80 years, 30 pack-year currently smoking OR have quit w/in 15years.) does not qualify.   Lung Cancer Screening Referral:   Additional Screening:  Hepatitis C Screening: does not qualify;  Vision Screening: Recommended annual ophthalmology exams for early detection of glaucoma and other disorders of the eye. Is the patient up to date with their annual eye exam?  Yes  Who is the provider or what is the name of the office in which the patient attends annual eye exams? Dr. Alois Cliche If pt is not established with a provider, would they like to be referred to a provider to establish care? No .   Dental Screening: Recommended annual dental exams for proper oral hygiene  Community Resource Referral / Chronic Care Management: CRR required this visit?  No   CCM required this visit?  No      Plan:     I have personally reviewed and noted the following in the patient's chart:   Medical and social history Use of alcohol, tobacco or illicit drugs  Current medications and supplements including opioid prescriptions.  Functional ability and status Nutritional status Physical activity Advanced directives List of other physicians Hospitalizations, surgeries, and ER visits in previous 12 months Vitals Screenings to include cognitive, depression, and falls Referrals and appointments  In addition, I have reviewed and discussed with patient certain preventive protocols, quality  metrics, and best practice recommendations. A written personalized care plan for preventive services as well as general preventive health recommendations were provided to patient.     Leroy Kennedy, LPN   10/08/6058   Nurse Notes:   Patient wanted to make sure her results were reviewed on her TSH it was high on her last test done by cardiologist.

## 2020-09-06 NOTE — Patient Instructions (Signed)
[name] , Thank you for taking time to come for your Medicare Wellness Visit. I appreciate your ongoing commitment to your health goals. Please review the following plan we discussed and let me know if I can assist you in the future.   Screening recommendations/referrals: Colonoscopy: up to date Mammogram: up to date Bone Density: up to date Recommended yearly ophthalmology/optometry visit for glaucoma screening and checkup Recommended yearly dental visit for hygiene and checkup  Vaccinations: Influenza vaccine: up to date Pneumococcal vaccine: up to date Tdap vaccine: Education provided Shingles vaccine: 1 of 2 Education provided    Advanced directives: on file  Conditions/risks identified:    Next appointment: 11-02-2020 @ 8:30  Dr. Gena Fray   Preventive Care 78 Years and Older, Female Preventive care refers to lifestyle choices and visits with your health care provider that can promote health and wellness. What does preventive care include? A yearly physical exam. This is also called an annual well check. Dental exams once or twice a year. Routine eye exams. Ask your health care provider how often you should have your eyes checked. Personal lifestyle choices, including: Daily care of your teeth and gums. Regular physical activity. Eating a healthy diet. Avoiding tobacco and drug use. Limiting alcohol use. Practicing safe sex. Taking low-dose aspirin every day. Taking vitamin and mineral supplements as recommended by your health care provider. What happens during an annual well check? The services and screenings done by your health care provider during your annual well check will depend on your age, overall health, lifestyle risk factors, and family history of disease. Counseling  Your health care provider may ask you questions about your: Alcohol use. Tobacco use. Drug use. Emotional well-being. Home and relationship well-being. Sexual activity. Eating  habits. History of falls. Memory and ability to understand (cognition). Work and work Statistician. Reproductive health. Screening  You may have the following tests or measurements: Height, weight, and BMI. Blood pressure. Lipid and cholesterol levels. These may be checked every 5 years, or more frequently if you are over 57 years old. Skin check. Lung cancer screening. You may have this screening every year starting at age 79 if you have a 30-pack-year history of smoking and currently smoke or have quit within the past 15 years. Fecal occult blood test (FOBT) of the stool. You may have this test every year starting at age 21. Flexible sigmoidoscopy or colonoscopy. You may have a sigmoidoscopy every 5 years or a colonoscopy every 10 years starting at age 81. Hepatitis C blood test. Hepatitis B blood test. Sexually transmitted disease (STD) testing. Diabetes screening. This is done by checking your blood sugar (glucose) after you have not eaten for a while (fasting). You may have this done every 1-3 years. Bone density scan. This is done to screen for osteoporosis. You may have this done starting at age 95. Mammogram. This may be done every 1-2 years. Talk to your health care provider about how often you should have regular mammograms. Talk with your health care provider about your test results, treatment options, and if necessary, the need for more tests. Vaccines  Your health care provider may recommend certain vaccines, such as: Influenza vaccine. This is recommended every year. Tetanus, diphtheria, and acellular pertussis (Tdap, Td) vaccine. You may need a Td booster every 10 years. Zoster vaccine. You may need this after age 21. Pneumococcal 13-valent conjugate (PCV13) vaccine. One dose is recommended after age 2. Pneumococcal polysaccharide (PPSV23) vaccine. One dose is recommended after age 43. Talk  to your health care provider about which screenings and vaccines you need and how  often you need them. This information is not intended to replace advice given to you by your health care provider. Make sure you discuss any questions you have with your health care provider. Document Released: 02/04/2015 Document Revised: 09/28/2015 Document Reviewed: 11/09/2014 Elsevier Interactive Patient Education  2017 [place] Prevention in the Home Falls can cause injuries. They can happen to people of all ages. There are many things you can do to make your home safe and to help prevent falls. What can I do on the outside of my home? Regularly fix the edges of walkways and driveways and fix any cracks. Remove anything that might make you trip as you walk through a door, such as a raised step or threshold. Trim any bushes or trees on the path to your home. Use bright outdoor lighting. Clear any walking paths of anything that might make someone trip, such as rocks or tools. Regularly check to see if handrails are loose or broken. Make sure that both sides of any steps have handrails. Any raised decks and porches should have guardrails on the edges. Have any leaves, snow, or ice cleared regularly. Use sand or salt on walking paths during winter. Clean up any spills in your garage right away. This includes oil or grease spills. What can I do in the bathroom? Use night lights. Install grab bars by the toilet and in the tub and shower. Do not use towel bars as grab bars. Use non-skid mats or decals in the tub or shower. If you need to sit down in the shower, use a plastic, non-slip stool. Keep the floor dry. Clean up any water that spills on the floor as soon as it happens. Remove soap buildup in the tub or shower regularly. Attach bath mats securely with double-sided non-slip rug tape. Do not have throw rugs and other things on the floor that can make you trip. What can I do in the bedroom? Use night lights. Make sure that you have a light by your bed that is easy to  reach. Do not use any sheets or blankets that are too big for your bed. They should not hang down onto the floor. Have a firm chair that has side arms. You can use this for support while you get dressed. Do not have throw rugs and other things on the floor that can make you trip. What can I do in the kitchen? Clean up any spills right away. Avoid walking on wet floors. Keep items that you use a lot in easy-to-reach places. If you need to reach something above you, use a strong step stool that has a grab bar. Keep electrical cords out of the way. Do not use floor polish or wax that makes floors slippery. If you must use wax, use non-skid floor wax. Do not have throw rugs and other things on the floor that can make you trip. What can I do with my stairs? Do not leave any items on the stairs. Make sure that there are handrails on both sides of the stairs and use them. Fix handrails that are broken or loose. Make sure that handrails are as long as the stairways. Check any carpeting to make sure that it is firmly attached to the stairs. Fix any carpet that is loose or worn. Avoid having throw rugs at the top or bottom of the stairs. If you do have throw rugs,  attach them to the floor with carpet tape. Make sure that you have a light switch at the top of the stairs and the bottom of the stairs. If you do not have them, ask someone to add them for you. What else can I do to help prevent falls? Wear shoes that: Do not have high heels. Have rubber bottoms. Are comfortable and fit you well. Are closed at the toe. Do not wear sandals. If you use a stepladder: Make sure that it is fully opened. Do not climb a closed stepladder. Make sure that both sides of the stepladder are locked into place. Ask someone to hold it for you, if possible. Clearly mark and make sure that you can see: Any grab bars or handrails. First and last steps. Where the edge of each step is. Use tools that help you move  around (mobility aids) if they are needed. These include: Canes. Walkers. Scooters. Crutches. Turn on the lights when you go into a dark area. Replace any light bulbs as soon as they burn out. Set up your furniture so you have a clear path. Avoid moving your furniture around. If any of your floors are uneven, fix them. If there are any pets around you, be aware of where they are. Review your medicines with your doctor. Some medicines can make you feel dizzy. This can increase your chance of falling. Ask your doctor what other things that you can do to help prevent falls. This information is not intended to replace advice given to you by your health care provider. Make sure you discuss any questions you have with your health care provider. Document Released: 11/04/2008 Document Revised: 06/16/2015 Document Reviewed: 02/12/2014 Elsevier Interactive Patient Education  2017 [employer].

## 2020-09-09 ENCOUNTER — Ambulatory Visit (HOSPITAL_COMMUNITY)
Admission: RE | Admit: 2020-09-09 | Discharge: 2020-09-09 | Disposition: A | Discharge status: Home / Self Care | Payer: Medicare Other | Source: Ambulatory Visit | Attending: Cardiovascular Disease | Admitting: Cardiovascular Disease

## 2020-09-09 ENCOUNTER — Other Ambulatory Visit: Payer: Self-pay

## 2020-09-09 DIAGNOSIS — I739 Peripheral vascular disease, unspecified: Secondary | ICD-10-CM | POA: Insufficient documentation

## 2020-09-12 ENCOUNTER — Telehealth: Payer: Self-pay

## 2020-09-12 DIAGNOSIS — I739 Peripheral vascular disease, unspecified: Secondary | ICD-10-CM

## 2020-09-12 NOTE — Telephone Encounter (Signed)
-----   Message from Josue Hector, MD sent at 09/09/2020 12:16 PM EDT ----- Has moderate PVD should have f/u with Dr Donzetta Matters [facility] has seen before

## 2020-09-14 NOTE — Telephone Encounter (Signed)
Patient aware of results.

## 2020-09-21 ENCOUNTER — Other Ambulatory Visit: Payer: Self-pay

## 2020-09-21 DIAGNOSIS — I739 Peripheral vascular disease, unspecified: Secondary | ICD-10-CM

## 2020-09-23 LAB — HM DIABETES EYE EXAM

## 2020-09-28 ENCOUNTER — Encounter: Payer: Self-pay | Admitting: Vascular Surgery

## 2020-09-28 ENCOUNTER — Other Ambulatory Visit: Payer: Self-pay

## 2020-09-28 ENCOUNTER — Ambulatory Visit (HOSPITAL_COMMUNITY)
Admission: RE | Admit: 2020-09-28 | Discharge: 2020-09-28 | Disposition: A | Discharge status: Home / Self Care | Payer: Medicare Other | Source: Ambulatory Visit | Attending: Vascular Surgery | Admitting: Vascular Surgery

## 2020-09-28 ENCOUNTER — Encounter (HOSPITAL_COMMUNITY): Payer: Self-pay

## 2020-09-28 ENCOUNTER — Ambulatory Visit (INDEPENDENT_AMBULATORY_CARE_PROVIDER_SITE_OTHER): Payer: Medicare Other | Admitting: Vascular Surgery

## 2020-09-28 VITALS — BP 177/77 | HR 66 | Temp 98.2°F | Resp 20 | Ht 63.0 in | Wt 151.0 lb

## 2020-09-28 DIAGNOSIS — I739 Peripheral vascular disease, unspecified: Secondary | ICD-10-CM | POA: Diagnosis not present

## 2020-09-28 NOTE — Progress Notes (Signed)
Patient ID: [name], female   DOB: 01/26/1940, 80 y.o.   MRN: [id number]  Reason for Consult: Follow-up   Referred by [name], MD  Subjective:     HPI:  [name] is a 80 y.o. female history of life limiting claudication.  She is undergone bilateral lower extremity angiography that required CO2 due to renal insufficiency.  On the right side she had SFA stenting ultimately the procedure was abandoned due to symptomatic bradycardia.  On the left side she did not have any intervention with noted peroneal artery runoff only.  She now states that she can walk about 50 feet prior to having calf cramping.  After stopping for few minutes her legs feel fine and she is able to walk again.  She does not have any tissue loss or ulceration.  She has no pain at rest.  Patient did recently have coronary artery bypass grafting.  She has no history of stroke, TIA or amaurosis.  She is on aspirin has an allergy to statins.  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[place])    Claudication ([place])    Coronary artery disease    stent 2007   Depression    Diabetes mellitus    Difficulty sleeping    Diverticulosis 12/10/03   Elevated cholesterol    Generalized weakness    GERD (gastroesophageal reflux disease)    Heart murmur    Hepatitis    as an infant   HSV-1 (herpes simplex virus 1) infection    Hyperlipidemia    Hypertension    Hypothyroidism    Osteoarthritis    PAD (peripheral artery disease) ([place])    Personal history of chemotherapy    Personal history of radiation therapy    Pneumonia    2014 september and March 2022   Rheumatoid arthritis ([place])     S/P aortic valve replacement with bioprosthetic valve 05/04/2020   [facility] Inspiris Resilia stented bovine pericardial tissue valve, size 23 mm   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   LIMA to D3, SVG to PDA, EVH via right thigh   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   Dr [name]   Spinal stenosis    Thyroid disease    Family History  Problem Relation Age of Onset   Throat cancer Father    Alcoholism Father    Heart attack Father    Heart disease Mother    Lymphoma Brother 31   Cancer Son    Heart attack Brother    Diabetes Brother    Hypertension Brother    Colon cancer Neg Hx    Stroke Neg Hx    Esophageal cancer Neg Hx    Rectal cancer Neg Hx    Stomach cancer Neg Hx    Past Surgical History:  Procedure Laterality Date   ABDOMINAL AORTOGRAM W/LOWER EXTREMITY Bilateral 04/29/2019   Procedure: ABDOMINAL AORTOGRAM W/LOWER EXTREMITY;  Surgeon: Marty Heck, MD;  Location: [facility];  Service: Cardiovascular;  Laterality: Bilateral;   ABDOMINAL AORTOGRAM W/LOWER EXTREMITY  Left 07/01/2019   Procedure: ABDOMINAL AORTOGRAM W/ Left LOWER EXTREMITY Runoff;  Surgeon: Marty Heck, MD;  Location: [facility];  Service: Cardiovascular;  Laterality: Left;   ANTERIOR AND POSTERIOR REPAIR N/A 04/05/2016   Procedure: ANTERIOR (CYSTOCELE) AND POSTERIOR REPAIR (RECTOCELE);  Surgeon: [name], MD;  Location: [facility];  Service: Gynecology;  Laterality: N/A;   AORTIC VALVE REPLACEMENT N/A 05/04/2020   Procedure: AORTIC VALVE REPLACEMENT (AVR) USING INSPIRIS 23MM RESILIA AORTIC VALVE;  Surgeon: Rexene Alberts, MD;  Location: [facility];  Service: Open Heart Surgery;  Laterality: N/A;   BREAST BIOPSY     BREAST LUMPECTOMY  1983   benign biopsy   BREAST LUMPECTOMY Right 12/29/2010   snbx, ER/PR +, Her2 -, 0/1 node pos.   CARPAL TUNNEL RELEASE Bilateral 9/12,8,12   CHOLECYSTECTOMY     COLONOSCOPY N/A 02/09/2013   Procedure: COLONOSCOPY;  Surgeon: [name], MD;  [facility]: [facility];  Service: Endoscopy;  Laterality: N/A;   COLONOSCOPY     CORONARY ANGIOPLASTY     CORONARY ARTERY BYPASS GRAFT N/A 05/04/2020   Procedure: CORONARY ARTERY BYPASS GRAFTING (CABG), ON PUMP, TIMES TWO, USING LEFT INTERNAL MAMMARY ARTERY AND RIGHT ENDOSCOPICALLY HARVESTED GREATER SAPHENOUS VEIN;  Surgeon: Rexene Alberts, MD;  Location: [facility];  Service: Open Heart Surgery;  Laterality: N/A;   DILATION AND CURETTAGE OF UTERUS     ESOPHAGOGASTRODUODENOSCOPY N/A 02/09/2013   Procedure: ESOPHAGOGASTRODUODENOSCOPY (EGD);  Surgeon: [name], MD;  Location: [name];  Service: Endoscopy;  Laterality: N/A;   FOOT SPURS     HYSTEROSCOPY WITH D & C N/A 09/11/2012   Procedure: DILATATION AND CURETTAGE ;  Surgeon: [name], MD;  Location: [facility];  Service: Gynecology;  Laterality: N/A;   KNEE SURGERY  2007   LAPAROSCOPIC ASSISTED VAGINAL HYSTERECTOMY N/A 04/05/2016   Procedure: LAPAROSCOPIC ASSISTED VAGINAL HYSTERECTOMY possible BSO;  Surgeon: [name], MD;  Location: [facility];  Service: Gynecology;  Laterality: N/A;   LUMBAR LAMINECTOMY/DECOMPRESSION MICRODISCECTOMY N/A 10/20/2014   Procedure: MICRO LUMBER DECOMPRESSION L3-4 L4-5;  Surgeon: [name], MD;  Location: [facility];  Service: Orthopedics;  Laterality: N/A;   LUMBAR LAMINECTOMY/DECOMPRESSION MICRODISCECTOMY Bilateral 10/13/2015   Procedure: MICRO LUMBAR DECOMPRESSION L5 - S1 AND REDO DECOMPRESSION L4 - L5 AND REMOVAL OF FACET CYST L4 - L5 2 LEVELS;  Surgeon: [name], MD;  Location: [facility];  Service: Orthopedics;  Laterality: Bilateral;   PERIPHERAL VASCULAR INTERVENTION Right 04/29/2019   Procedure: PERIPHERAL VASCULAR INTERVENTION;  Surgeon: Marty Heck, MD;  Location: [facility];  Service: Cardiovascular;  Laterality: Right;   PORT-A-CATH REMOVAL  04/17/2011   Procedure: REMOVAL PORT-A-CATH;  Surgeon: [name], MD;  Location: [facility];  Service: General;  Laterality: Left;    PORTACATH PLACEMENT  02/07/2011   Procedure: INSERTION PORT-A-CATH;  Surgeon: [name], MD;  Location: [facility];  Service: General;  Laterality: N/A;   RIGHT/LEFT HEART CATH AND CORONARY ANGIOGRAPHY N/A 03/17/2020   Procedure: RIGHT/LEFT HEART CATH AND CORONARY ANGIOGRAPHY;  Surgeon: Burnell Blanks, MD;  Location: [facility];  Service: Cardiovascular;  Laterality: N/A;   TEE WITHOUT CARDIOVERSION N/A 05/04/2020   Procedure: TRANSESOPHAGEAL ECHOCARDIOGRAM (TEE);  Surgeon: Rexene Alberts, MD;  Location: [facility];  Service: Open Heart Surgery;  Laterality: N/A;   UPPER GASTROINTESTINAL ENDOSCOPY      Short Social History:  Social History   Tobacco Use   Smoking status: Former  Packs/day: 2.00    Years: 40.00    Pack years: 80.00    Types: Cigarettes    Quit date: 12/19/1997    Years since quitting: 22.7   Smokeless tobacco: Never  Substance Use Topics   Alcohol use: Yes    Alcohol/week: 1.0 standard drink    Types: 1 Glasses of wine per week    Comment: occasiona - maybe once a month    Allergies  Allergen Reactions   Codeine Nausea And Vomiting and Other (See Comments)    Severe stomach cramps   Antihistamines, Diphenhydramine-Type Other (See Comments)    Causes hyperactivity   Gabapentin     Urinary incontinence   Statins Other (See Comments)    Joint pains   Sulfa Antibiotics     Unknown to pt   Erythromycin Hives and Rash    Due to dental work about 50 years ago. Was used in packing and resulted in rash/hives inside and outside of mouth.    Current Outpatient Medications  Medication Sig Dispense Refill   Accu-Chek FastClix Lancets MISC USE TO CHECK BLOOD SUGARS 3 TIMES DAILY 306 each 3   acetaminophen (TYLENOL) 500 MG tablet Take 1,000 mg by mouth every 6 (six) hours as needed for moderate pain or headache.     aspirin EC 81 MG tablet Take 81 mg by mouth at bedtime.     calcium carbonate (TUMS - DOSED IN MG ELEMENTAL CALCIUM) 500 MG chewable  tablet Chew 2 tablets by mouth 3 (three) times daily as needed for indigestion or heartburn.     Cholecalciferol (VITAMIN D3) 25 MCG (1000 UT) CAPS Take 1,000 Units by mouth daily before lunch.     furosemide (LASIX) 20 MG tablet Take 1 tablet (20 mg total) by mouth daily. 90 tablet 3   glipiZIDE (GLUCOTROL XL) 10 MG 24 hr tablet TAKE 1 TABLET BY MOUTH  DAILY AT 12 NOON 90 tablet 0   glucose blood (ACCU-CHEK GUIDE) test strip TEST ONCE DAILY 100 each 5   Golimumab (SIMPONI ARIA IV) Inject into the vein.     levothyroxine (SYNTHROID) 88 MCG tablet TAKE 1 TABLET BY MOUTH  DAILY 90 tablet 3   losartan (COZAAR) 50 MG tablet Take 50 mg by mouth 2 (two) times daily.     Magnesium 250 MG TABS Take 250 mg by mouth at bedtime.     metFORMIN (GLUCOPHAGE) 500 MG tablet Take 1 tablet (500 mg total) by mouth daily with breakfast. 90 tablet 3   metoprolol tartrate (LOPRESSOR) 25 MG tablet TAKE 1 TABLET(25 MG) BY MOUTH TWICE DAILY 180 tablet 3   Omega-3 Fatty Acids (FISH OIL) 1200 MG CAPS Take 1,200 mg by mouth in the morning and at bedtime.     pantoprazole (PROTONIX) 40 MG tablet Take 1 tablet (40 mg total) by mouth daily. 90 tablet 3   PARoxetine (PAXIL) 20 MG tablet Take 1 tablet (20 mg total) by mouth in the morning. 90 tablet 3   pioglitazone (ACTOS) 30 MG tablet TAKE 1 TABLET(30 MG) BY MOUTH DAILY 90 tablet 1   sitaGLIPtin (JANUVIA) 100 MG tablet Take 1 tablet (100 mg total) by mouth daily. 90 tablet 3   valACYclovir (VALTREX) 1000 MG tablet Take 1 tablet (1,000 mg total) by mouth daily as needed (Fever Blisters). 180 tablet 0   No current facility-administered medications for this visit.    Review of Systems  Constitutional:  Constitutional negative. HENT: HENT negative.  Eyes: Eyes negative.  Respiratory: Positive  for shortness of breath.  Cardiovascular: Positive for claudication.  GI: Gastrointestinal negative.  Musculoskeletal: Musculoskeletal negative.  Skin: Skin negative.  Neurological:  Neurological negative. Hematologic: Hematologic/lymphatic negative.  Psychiatric: Psychiatric negative.       Objective:  Objective   Vitals:   09/28/20 0921  BP: (!) 177/77  Pulse: 66  Resp: 20  Temp: 98.2 F (36.8 C)  SpO2: 96%  Weight: 151 lb (68.5 kg)  Height: 5' 3" (1.6 m)   Body mass index is 26.75 kg/m.  Physical Exam HENT:     Head: Normocephalic.     Nose:     Comments: Wearing a mask Eyes:     Pupils: Pupils are equal, round, and reactive to light.  Neck:     Vascular: No carotid bruit.  Cardiovascular:     Rate and Rhythm: Normal rate.     Pulses:          Femoral pulses are 2+ on the right side and 2+ on the left side.      Popliteal pulses are 0 on the right side and 0 on the left side.  Pulmonary:     Effort: Pulmonary effort is normal.  Abdominal:     General: Abdomen is flat.     Palpations: Abdomen is soft.  Skin:    General: Skin is warm and dry.     Capillary Refill: Capillary refill takes 2 to 3 seconds.  Neurological:     General: No focal deficit present.     Mental Status: She is alert.  Psychiatric:        Mood and Affect: Mood normal.        Behavior: Behavior normal.        Thought Content: Thought content normal.    Data: ----+  RIGHT     PSV cm/sRatioStenosisWaveformComments                              +----------+--------+-----+--------+--------+------------------------------  ----+  CFA Prox  95                   biphasic                                      +----------+--------+-----+--------+--------+------------------------------  ----+  DFA       83                   biphasic                                      +----------+--------+-----+--------+--------+------------------------------  ----+  SFA Prox  61                   biphasic                                      +----------+--------+-----+--------+--------+------------------------------  ----+  POP Prox  76                    biphasic                                      +----------+--------+-----+--------+--------+------------------------------  ----+  POP Mid   102                  biphasicdecrease velocities compared  to                                            prior exam                            +----------+--------+-----+--------+--------+------------------------------  ----+  POP Distal128                  biphasic                                      +----------+--------+-----+--------+--------+------------------------------  ----+  TP Trunk  76                   biphasic                                      +----------+--------+-----+--------+--------+------------------------------  ----+        Right Stent(s):  +------------------------------------------+--------+--------+--------+----  ----+  Proximal to Distal SFA/Proximal Popliteal PSV  cm/sStenosisWaveformComments  A                                                                             +------------------------------------------+--------+--------+--------+----  ----+  Prox to Stent                             88              biphasic            +------------------------------------------+--------+--------+--------+----  ----+  Proximal Stent                            120             biphasic            +------------------------------------------+--------+--------+--------+----  ----+  Mid Stent                                 60              biphasic            +------------------------------------------+--------+--------+--------+----  ----+  Distal Stent                              36              biphasic            +------------------------------------------+--------+--------+--------+----  ----+  Distal to Stent  85              biphasic             +------------------------------------------+--------+--------+--------+----  ----+   Widely patent right proximal to distal SFA/proximal popliteal artery stent  without evidence of stenosis.       Summary:  Right: No significant change compared to previous study.    Heterogenous plaque throughout.  Widely patent right proximal to distal SFA/proximal popliteal artery stent  without evidence of stenosis. .   ABI Findings:  +---------+------------------+-----+----------+--------+  Right    Rt Pressure (mmHg)IndexWaveform  Comment   +---------+------------------+-----+----------+--------+  Brachial 192                                        +---------+------------------+-----+----------+--------+  PTA      125               0.65 monophasic          +---------+------------------+-----+----------+--------+  PERO     78                     monophasic.41       +---------+------------------+-----+----------+--------+  DP       118               0.61 monophasic          +---------+------------------+-----+----------+--------+  Great Toe47                0.24 Abnormal            +---------+------------------+-----+----------+--------+   +---------+------------------+-----+----------------------+-------+  Left     Lt Pressure (mmHg)IndexWaveform              Comment  +---------+------------------+-----+----------------------+-------+  Brachial 184                                                   +---------+------------------+-----+----------------------+-------+  PTA      139               0.72 biphasic to monophasic         +---------+------------------+-----+----------------------+-------+  PERO     153                    biphasic              .80      +---------+------------------+-----+----------------------+-------+  DP       153               0.80 multiphasic                     +---------+------------------+-----+----------------------+-------+  Great Toe82                0.43 Abnormal                       +---------+------------------+-----+----------------------+-------+   +-------+-----------+-----------+------------+------------+  ABI/TBIToday's ABIToday's TBIPrevious ABIPrevious TBI  +-------+-----------+-----------+------------+------------+  Right  .65        .24        .74         .26           +-------+-----------+-----------+------------+------------+  Left   .80        .  43        .82         .50           +-------+-----------+-----------+------------+------------+           Assessment/Plan:     80 year old female with 50 foot distance claudication with known disease tibial runoff.  She has SFA stenting on the right the procedure was abandoned for symptomatic bradycardia.  On the left side she had no intervention with known peroneal artery runoff.  She does have chronic kidney disease and CO2 was used for both of these procedures.  Given her claudication I have discussed with her the need for continued walking as much as tolerable as well as other exercise for her lower extremities as tolerated.  She will continue aspirin with an allergy to statins.  I discussed with her the complicated nature of angiography in her situation although we can proceed with this if absolutely necessary I think medical management for now with walking program is in her best interest.  We will see her back in 6 months with repeat exercise studies.     Waynetta Sandy MD [facility]

## 2020-09-29 ENCOUNTER — Other Ambulatory Visit: Payer: Self-pay | Admitting: *Deleted

## 2020-09-29 DIAGNOSIS — I739 Peripheral vascular disease, unspecified: Secondary | ICD-10-CM

## 2020-10-03 NOTE — Progress Notes (Signed)
Virtual Visit via Video Note   This visit type was conducted due to national recommendations for restrictions regarding the COVID-19 Pandemic (e.g. social distancing) in an effort to limit this patient's exposure and mitigate transmission in our community.  Due to her co-morbid illnesses, this patient is at least at moderate risk for complications without adequate follow up.  This format is felt to be most appropriate for this patient at this time.  All issues noted in this document were discussed and addressed.  A limited physical exam was performed with this format.  Please refer to the patient's chart for her consent to telehealth for [facility].    Patient location: Home Physician location: Office    Date:  10/05/2020   ID:  [name], DOB 09-11-1940, MRN [id number]  PCP:  [name], MD   [facility] Providers Cardiologist:  [name], MD     Patient Profile:    [name] is a 80 y.o. female with:  Coronary artery disease  Aortic stenosis S/p DES to RCA in 2007 Myoview 2017 low risk  S/p CABG + AVR 4/22 Hypertension  Diabetes mellitus  Chronic kidney disease  Hyperlipidemia  Breast CA  Peripheral arterial disease  S/p R SFA stent GERD Hypothyroidism  Carotid artery disease  S/p carotid stent in 2007 [place] 4/22: Bilat 1-39  Prior CV studies: Pre-CABG Dopplers 05/02/20 Bilateral ICA 1-39   RIGHT/LEFT HEART CATH AND CORONARY ANGIOGRAPHY 03/17/2020 Narrative  Ost RCA to Prox RCA lesion is 80% stenosed.  Mid RCA lesion is 30% stenosed.  RPDA lesion is 60% stenosed.  1st Mrg lesion is 50% stenosed.  Prox Cx to Mid Cx lesion is 80% stenosed.  Mid LAD lesion is 80% stenosed. 1. No significant disease in the left main artery 2. Severe mid LAD stenosis. The LAD bifurcated distally beyond this lesion and the diagonal branch reaches the apex. 3. Moderate caliber non-dominant Circumflex with severe mid stenosis. The vessel is relatively small  beyond the stenosis. The small to moderate caliber obtuse marginal branch arises prior to the stenosis and has moderate ostial stenosis. 4. The RCA is a large dominant artery. There is severe, heavily calcified stenosis at the ostium of the RCA. The distal stented segment is patent. There is moderately severe stenosis in the moderate caliber PDA. 5. Severe aortic stenosis (mean gradient 26.9 mmHg, peak gradient 34 mmHg)  Echocardiogram 02/29/20 Moderate-severe aortic stenosis, V-max 3.5 m/s, mean gradient 33 mmHg, DI 0.26, moderate MR, EF 65-70, no RWMA, moderate LVH, normal RVSF  GATED SPECT MYO PERF W/EXERCISE STRESS 1D 11/22/2015 Narrative  Nuclear stress EF: 59%. Normal wall motion  There was no ST segment deviation noted during stress.  This is a low risk study. No perfusion defects, no ischemia   HPI:   80 y.o. with CAD and now post CABG/AVR  03/17/20 Cardiac catheterization demonstrated severe disease in the LAD and RCA.  There was also severe disease in a very small LCx.  She was referred to Dr. Roxy Manns for CABG+AVR. She was admitted 4/13-4/18 and underwent CABG (L-LAD, S-PDA) + bioprosthetic AVR.  Circumflex / OM  too small to graft The post op course was fairly uneventful.  She returns for f/u.  She has felt poorly She stopped her repatha and lasix on her own   Post op TTE 06/15/20 with EF 60-65% moderate MR normal AVR with no AR and mean gradient 4 peak 6.7 mmHg DVI 0.56.    Last Hct was  33.8 she did receive a transfusion post op BUN 33 / Cr 1.64 On 05/09/20   Seen by Dr Donzetta Matters [facility] last week and she likes him better Plan to observe LLE moderate PVD for now Discussed taking lasix daily with elevated BNP and weight gain        Past Medical History:  Diagnosis Date   Allergy    Anemia    childhood   Anxiety    Aortic stenosis    Arterial stenosis (HCC)    mesenteric   Arthritis    Breast cancer ([place]) 12/05/10   R breast, inv mammary, in situ,ER/PR +,HER2 -   Cancer ([place])     Carcinoma of breast treated with adjuvant chemotherapy ([place])    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[place])    Claudication ([place])    Coronary artery disease    stent 2007   Depression    Diabetes mellitus    Difficulty sleeping    Diverticulosis 12/10/03   Elevated cholesterol    Generalized weakness    GERD (gastroesophageal reflux disease)    Heart murmur    Hepatitis    as an infant   HSV-1 (herpes simplex virus 1) infection    Hyperlipidemia    Hypertension    Hypothyroidism    Osteoarthritis    PAD (peripheral artery disease) ([place])    Personal history of chemotherapy    Personal history of radiation therapy    Pneumonia    2014 september and March 2022   Rheumatoid arthritis ([place])    S/P aortic valve replacement with bioprosthetic valve 05/04/2020   Edwards Inspiris Resilia stented bovine pericardial tissue valve, size 23 mm   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   LIMA to D3, SVG to PDA, EVH via right thigh   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   aspirin EC 81 MG tablet Take 81 mg by mouth at bedtime.   calcium carbonate (TUMS - DOSED IN MG ELEMENTAL CALCIUM) 500 MG chewable tablet Chew 2 tablets by mouth 3 (three) times daily as needed for indigestion or heartburn.   Cholecalciferol (VITAMIN D3) 25 MCG (1000 UT) CAPS Take 1,000 Units by mouth daily before lunch.   furosemide (LASIX) 20 MG tablet Take 1 tablet (20 mg total) by mouth daily.   glipiZIDE (GLUCOTROL XL) 10 MG 24 hr tablet TAKE 1 TABLET BY MOUTH  DAILY AT 12 NOON   glucose blood (ACCU-CHEK GUIDE) test strip TEST ONCE DAILY   Golimumab (SIMPONI ARIA IV) Inject into the vein.   levothyroxine (SYNTHROID) 88 MCG  tablet TAKE 1 TABLET BY MOUTH  DAILY   losartan (COZAAR) 50 MG tablet Take 50 mg by mouth 2 (two) times daily.   Magnesium 250 MG TABS Take 250 mg by mouth at bedtime.   metFORMIN (GLUCOPHAGE) 500 MG tablet Take 1 tablet (500 mg total) by mouth daily with breakfast.   metoprolol tartrate (LOPRESSOR) 25 MG tablet TAKE 1 TABLET(25 MG) BY MOUTH TWICE DAILY   Omega-3 Fatty Acids (FISH OIL) 1200 MG CAPS Take 1,200 mg by mouth in the morning and at bedtime.   pantoprazole (  PROTONIX) 40 MG tablet Take 1 tablet (40 mg total) by mouth daily.   PARoxetine (PAXIL) 20 MG tablet Take 1 tablet (20 mg total) by mouth in the morning.   pioglitazone (ACTOS) 30 MG tablet TAKE 1 TABLET(30 MG) BY MOUTH DAILY   sitaGLIPtin (JANUVIA) 100 MG tablet Take 1 tablet (100 mg total) by mouth daily.   valACYclovir (VALTREX) 1000 MG tablet Take 1 tablet (1,000 mg total) by mouth daily as needed (Fever Blisters).     Allergies:   Codeine; Antihistamines, diphenhydramine-type; Gabapentin; Statins; Sulfa antibiotics; and Erythromycin   Social History   Tobacco Use   Smoking status: Former    Packs/day: 2.00    Years: 40.00    Pack years: 80.00    Types: Cigarettes    Quit date: 12/19/1997    Years since quitting: 22.8   Smokeless tobacco: Never  Vaping Use   Vaping Use: Never used  Substance Use Topics   Alcohol use: Yes    Alcohol/week: 1.0 standard drink    Types: 1 Glasses of wine per week    Comment: occasiona - maybe once a month   Drug use: No     Family Hx: The patient's family history includes Alcoholism in her father; Cancer in her son; Diabetes in her brother; Heart attack in her brother and father; Heart disease in her mother; Hypertension in her brother; Lymphoma (age of onset: 1) in her brother; Throat cancer in her father. There is no history of Colon cancer, Stroke, Esophageal cancer, Rectal cancer, or Stomach cancer.  Review of Systems  Constitutional: Negative for decreased appetite and  fever.  Respiratory:  Negative for cough.     EKGs/Labs/Other Test Reviewed:    EKG:   SR LVH with strain ? Old IMI 05/30/20   Recent Labs: 05/05/2020: Magnesium 2.2 07/20/2020: ALT 9 08/24/2020: Hemoglobin 12.7; Platelets 226; TSH 10.600 09/01/2020: BUN 30; Creatinine, Ser 1.79; NT-Pro BNP 2,950; Potassium 5.3; Sodium 139   Recent Lipid Panel Lab Results  Component Value Date/Time   CHOL 195 07/20/2020 09:08 AM   TRIG 236.0 (H) 07/20/2020 09:08 AM   HDL 36.20 (L) 07/20/2020 09:08 AM   CHOLHDL 5 07/20/2020 09:08 AM   LDLCALC 83 05/13/2008 09:29 AM   LDLDIRECT 113.0 07/20/2020 09:08 AM      Risk Assessment/Calculations:      Physical Exam:    VS:  BP (!) 183/96   Pulse 74   Ht 5' 3" (1.6 m)   Wt 68.7 kg   BMI 26.82 kg/m     Wt Readings from Last 3 Encounters:  10/05/20 68.7 kg  09/28/20 68.5 kg  08/24/20 68 kg     Telephone no exam        ASSESSMENT & PLAN:    CAD/CABG:  05/04/20 LIMA to Diagonal and SVG to PdA continue ASA And beta blocker AVR:  bicuspid pathology post 23 mm Edwards Inspiris Resilia stented bovine pericardial tissue valve Post op echo 06/15/20 normal function  HLD:  Intolerant to statins Felt poorly on Repatha and discontinued  will observe for now  DM:  Discussed low carb diet.  Target hemoglobin A1c is 6.5 or less.  Continue current medications. HTN:  Well controlled.  Continue current medications and low sodium Dash type diet.   Thyroid : on synthroid replacement check labs  PVD:  Previous right SFA stenting ABI"s 0.74 bilaterally 05/02/20 prior to AVR/CABG Most recent ABI 09/09/20 right 0.65 and left 0.80 f/u with [facility] MR:  TTE  06/15/20 AVR normal EF normal moderate MR f/u echo May 2023 on Lasix 20 mg daily    Dispo:   F/U 6 months BMET / BNP in 3 weeks   Time:  reviewing CABG/AVR report post op TTE, ABI"s labs direct patient interview and composing note 25 minutes   Medication Adjustments/Labs and Tests Ordered: Current medicines are reviewed  at length with the patient today.  Concerns regarding medicines are outlined above.  Tests Ordered:  BMET / BNP   Medication Changes: No orders of the defined types were placed in this encounter.    Signed, [name], MD  10/05/2020 8:48 AM    [address], [place], [place]  [address] Phone: [phone]; Fax: [phone]

## 2020-10-05 ENCOUNTER — Telehealth (INDEPENDENT_AMBULATORY_CARE_PROVIDER_SITE_OTHER): Payer: Medicare Other | Admitting: Cardiovascular Disease

## 2020-10-05 ENCOUNTER — Other Ambulatory Visit: Payer: Self-pay

## 2020-10-05 VITALS — BP 183/96 | HR 74 | Ht 63.0 in | Wt 151.4 lb

## 2020-10-05 DIAGNOSIS — R7989 Other specified abnormal findings of blood chemistry: Secondary | ICD-10-CM | POA: Diagnosis not present

## 2020-10-05 DIAGNOSIS — N184 Chronic kidney disease, stage 4 (severe): Secondary | ICD-10-CM

## 2020-10-05 DIAGNOSIS — I2510 Atherosclerotic heart disease of native coronary artery without angina pectoris: Secondary | ICD-10-CM

## 2020-10-05 DIAGNOSIS — E1122 Type 2 diabetes mellitus with diabetic chronic kidney disease: Secondary | ICD-10-CM | POA: Diagnosis not present

## 2020-10-05 DIAGNOSIS — Z79899 Other long term (current) drug therapy: Secondary | ICD-10-CM | POA: Diagnosis not present

## 2020-10-05 NOTE — Patient Instructions (Signed)
Medication Instructions:   *If you need a refill on your cardiac medications before your next appointment, please call your pharmacy*  Lab Work: Your physician recommends that you return for lab work in: 3 weeks for BMET and BNP  If you have labs (blood work) drawn today and your tests are completely normal, you will receive your results only by: [place] (if you have MyChart) OR A paper copy in the mail If you have any lab test that is abnormal or we need to change your treatment, we will call you to review the results.  Testing/Procedures: None ordered today.  Follow-Up: At [facility], you and your health needs are our priority.  As part of our continuing mission to provide you with exceptional heart care, we have created designated Provider Care Teams.  These Care Teams include your primary Cardiologist (physician) and Advanced Practice Providers (APPs -  Physician Assistants and Nurse Practitioners) who all work together to provide you with the care you need, when you need it.  We recommend signing up for the patient portal called "MyChart".  Sign up information is provided on this After Visit Summary.  MyChart is used to connect with patients for Virtual Visits (Telemedicine).  Patients are able to view lab/test results, encounter notes, upcoming appointments, etc.  Non-urgent messages can be sent to your provider as well.   To learn more about what you can do with MyChart, go to [email or web address].    Your next appointment:   As scheduled   The format for your next appointment:   In Person  Provider:   You may see [name], MD or one of the following Advanced Practice Providers on your designated Care Team:   Cecilie Kicks, NP

## 2020-10-10 ENCOUNTER — Encounter: Payer: Self-pay | Admitting: Family Medicine

## 2020-10-19 ENCOUNTER — Ambulatory Visit: Payer: Medicare Other | Admitting: Cardiovascular Disease

## 2020-10-19 ENCOUNTER — Other Ambulatory Visit: Payer: Self-pay

## 2020-10-19 ENCOUNTER — Ambulatory Visit (INDEPENDENT_AMBULATORY_CARE_PROVIDER_SITE_OTHER): Payer: Medicare Other | Admitting: Family Medicine

## 2020-10-19 VITALS — BP 150/78 | HR 80 | Temp 97.3°F | Ht 63.0 in | Wt 155.6 lb

## 2020-10-19 DIAGNOSIS — N3000 Acute cystitis without hematuria: Secondary | ICD-10-CM

## 2020-10-19 LAB — POCT URINALYSIS DIPSTICK
Bilirubin, UA: NEGATIVE
Glucose, UA: NEGATIVE
Ketones, UA: NEGATIVE
Nitrite, UA: NEGATIVE
Protein, UA: POSITIVE — AB
Spec Grav, UA: 1.02 (ref 1.010–1.025)
Urobilinogen, UA: 0.2 E.U./dL
pH, UA: 6 (ref 5.0–8.0)

## 2020-10-19 MED ORDER — PHENAZOPYRIDINE HCL 100 MG PO TABS
100.0000 mg | ORAL_TABLET | Freq: Three times a day (TID) | ORAL | 0 refills | Status: DC | PRN
Start: 2020-10-19 — End: 2020-11-02

## 2020-10-19 MED ORDER — AMOXICILLIN-POT CLAVULANATE 500-125 MG PO TABS: 1.0000 | ORAL_TABLET | Freq: Two times a day (BID) | ORAL | 0 refills | Status: DC

## 2020-10-19 NOTE — Progress Notes (Signed)
[facility] [address] [place] [address] Dept: [phone] Dept Fax: [phone]  Office Visit  Subjective:    Patient ID: [name], female    DOB: 1941/01/09, 80 y.o..   MRN: [id number]  Chief Complaint  Patient presents with   Acute Visit    C/o having pressure/pain with urination x 2-3 days.      History of Present Illness:  Patient is in today for a 3 day history of dysuria, urinary urgency, suprapubic pressure and left lower back pain. She denies any fever. Ms. Mutch has had UTIs int he past. She notes these sued to be quite frequent, but have not occurred in a while. She states she was treated with Cipro in the past, even down to a single dose treatment. She denies any issues with yeast infections after antibiotics.  Past Medical History: Patient Active Problem List   Diagnosis Date Noted   Irritation of eyelid 07/26/2020   S/P aortic valve replacement with bioprosthetic valve 05/04/2020   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   PAD (peripheral artery disease) (HCC)    Malignant tumor of breast ([place]) 04/16/2019   Rheumatoid arthritis ([place]) 04/07/2019   Low back pain 02/03/2019   Scoliosis deformity of spine 02/03/2019   Chronic kidney disease 11/17/2018   Osteoarthritis of hip 11/17/2018   Other specified disorders of adrenal gland ([place]) 10/31/2018   Claudication ([place])    Nonvenomous bug bite of shoulder or upper arm, infected, left, initial encounter 07/21/2018   Aortic stenosis 12/16/2017   Chronic headaches 11/12/2017   Iron deficiency anemia 11/12/2017   Vitamin D deficiency 09/10/2017   Vitamin B12 deficiency 09/10/2017   Degeneration of lumbar intervertebral disc 06/13/2017   Hyperkalemia 06/03/2017   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[place])    Mass of left adrenal gland ([place]) 04/19/2017   Prolapse of female pelvic organs 04/05/2016   Spinal stenosis of lumbar region 10/20/2014    Hyperlipidemia 03/16/2013   Dense breasts 12/22/2012   Anxiety state 04/16/2012   Arthropathy of hand 04/16/2012   Disease of lung 04/16/2012   Herpes simplex 04/16/2012   Osteoarthritis of knee 04/16/2012   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 female breast ([place]) 12/20/2010   History of breast cancer 12/05/2010   DEPRESSION 09/14/2008   GERD 09/14/2008   Type 2 diabetes mellitus with diabetic neuropathy, without long-term current use of insulin ([place]) 05/20/2008   Primary hypertension 04/17/2007   Coronary atherosclerosis 04/17/2007   Hypothyroidism 04/03/2007   Past Surgical History:  Procedure Laterality Date   ABDOMINAL AORTOGRAM W/LOWER EXTREMITY Bilateral 04/29/2019   Procedure: ABDOMINAL AORTOGRAM W/LOWER EXTREMITY;  Surgeon: Marty Heck, MD;  Location: [facility];  Service: Cardiovascular;  Laterality: Bilateral;   ABDOMINAL AORTOGRAM W/LOWER EXTREMITY Left 07/01/2019   Procedure: ABDOMINAL AORTOGRAM W/ Left LOWER EXTREMITY [place];  Surgeon: Marty Heck, MD;  Location: [facility];  Service: Cardiovascular;  Laterality: Left;   ANTERIOR AND POSTERIOR REPAIR N/A 04/05/2016   Procedure: ANTERIOR (CYSTOCELE) AND POSTERIOR REPAIR (RECTOCELE);  Surgeon: [name], MD;  Location: [facility];  Service: Gynecology;  Laterality: N/A;   AORTIC VALVE REPLACEMENT N/A 05/04/2020   Procedure: AORTIC VALVE REPLACEMENT (AVR) USING INSPIRIS 23MM RESILIA AORTIC VALVE;  Surgeon: Rexene Alberts, MD;  Location: [facility];  Service: Open Heart Surgery;  Laterality: N/A;   BREAST BIOPSY     BREAST LUMPECTOMY  1983   benign  biopsy   BREAST LUMPECTOMY Right 12/29/2010   snbx, ER/PR +, Her2 -, 0/1 node pos.   CARPAL TUNNEL RELEASE Bilateral 9/12,8,12   CHOLECYSTECTOMY     COLONOSCOPY N/A 02/09/2013   Procedure: COLONOSCOPY;  Surgeon: [name], MD;  Location: [facility];  Service: Endoscopy;  Laterality: N/A;   COLONOSCOPY     CORONARY ANGIOPLASTY      CORONARY ARTERY BYPASS GRAFT N/A 05/04/2020   Procedure: CORONARY ARTERY BYPASS GRAFTING (CABG), ON PUMP, TIMES TWO, USING LEFT INTERNAL MAMMARY ARTERY AND RIGHT ENDOSCOPICALLY HARVESTED GREATER SAPHENOUS VEIN;  Surgeon: Rexene Alberts, MD;  Location: [facility];  Service: Open Heart Surgery;  Laterality: N/A;   DILATION AND CURETTAGE OF UTERUS     ESOPHAGOGASTRODUODENOSCOPY N/A 02/09/2013   Procedure: ESOPHAGOGASTRODUODENOSCOPY (EGD);  Surgeon: [name], MD;  Location: [name];  Service: Endoscopy;  Laterality: N/A;   FOOT SPURS     HYSTEROSCOPY WITH D & C N/A 09/11/2012   Procedure: DILATATION AND CURETTAGE ;  Surgeon: [name], MD;  Location: [facility];  Service: Gynecology;  Laterality: N/A;   KNEE SURGERY  2007   LAPAROSCOPIC ASSISTED VAGINAL HYSTERECTOMY N/A 04/05/2016   Procedure: LAPAROSCOPIC ASSISTED VAGINAL HYSTERECTOMY possible BSO;  Surgeon: [name], MD;  Location: [facility];  Service: Gynecology;  Laterality: N/A;   LUMBAR LAMINECTOMY/DECOMPRESSION MICRODISCECTOMY N/A 10/20/2014   Procedure: MICRO LUMBER DECOMPRESSION L3-4 L4-5;  Surgeon: [name], MD;  [facility]: [facility];  Service: Orthopedics;  Laterality: N/A;   LUMBAR LAMINECTOMY/DECOMPRESSION MICRODISCECTOMY Bilateral 10/13/2015   Procedure: MICRO LUMBAR DECOMPRESSION L5 - S1 AND REDO DECOMPRESSION L4 - L5 AND REMOVAL OF FACET CYST L4 - L5 2 LEVELS;  Surgeon: [name], MD;  Location: [facility];  Service: Orthopedics;  Laterality: Bilateral;   PERIPHERAL VASCULAR INTERVENTION Right 04/29/2019   Procedure: PERIPHERAL VASCULAR INTERVENTION;  Surgeon: Marty Heck, MD;  Location: [facility];  Service: Cardiovascular;  Laterality: Right;   PORT-A-CATH REMOVAL  04/17/2011   Procedure: REMOVAL PORT-A-CATH;  Surgeon: [name], MD;  Location: [facility];  Service: General;  Laterality: Left;   PORTACATH PLACEMENT  02/07/2011   Procedure: INSERTION PORT-A-CATH;  Surgeon: [name], MD;  Location: [facility];  Service: General;  Laterality: N/A;   RIGHT/LEFT HEART CATH AND CORONARY ANGIOGRAPHY N/A 03/17/2020   Procedure: RIGHT/LEFT HEART CATH AND CORONARY ANGIOGRAPHY;  Surgeon: Burnell Blanks, MD;  Location: [facility];  Service: Cardiovascular;  Laterality: N/A;   TEE WITHOUT CARDIOVERSION N/A 05/04/2020   Procedure: TRANSESOPHAGEAL ECHOCARDIOGRAM (TEE);  Surgeon: Rexene Alberts, MD;  Location: [facility];  Service: Open Heart Surgery;  Laterality: N/A;   UPPER GASTROINTESTINAL ENDOSCOPY     Family History  Problem Relation Age of Onset   Throat cancer Father    Alcoholism Father    Heart attack Father    Heart disease Mother    Lymphoma Brother 10   Cancer Son    Heart attack Brother    Diabetes Brother    Hypertension Brother    Colon cancer Neg Hx    Stroke Neg Hx    Esophageal cancer Neg Hx    Rectal cancer Neg Hx    Stomach cancer Neg Hx    Outpatient Medications Prior to Visit  Medication Sig Dispense Refill   Accu-Chek FastClix Lancets MISC USE TO CHECK BLOOD SUGARS 3 TIMES DAILY 306 each 3   acetaminophen (TYLENOL) 500 MG tablet Take 1,000 mg by mouth every 6 (  six) hours as needed for moderate pain or headache.     aspirin EC 81 MG tablet Take 81 mg by mouth at bedtime.     calcium carbonate (TUMS - DOSED IN MG ELEMENTAL CALCIUM) 500 MG chewable tablet Chew 2 tablets by mouth 3 (three) times daily as needed for indigestion or heartburn.     Cholecalciferol (VITAMIN D3) 25 MCG (1000 UT) CAPS Take 1,000 Units by mouth daily before lunch.     furosemide (LASIX) 20 MG tablet Take 1 tablet (20 mg total) by mouth daily. 90 tablet 3   glipiZIDE (GLUCOTROL XL) 10 MG 24 hr tablet TAKE 1 TABLET BY MOUTH  DAILY AT 12 NOON 90 tablet 0   glucose blood (ACCU-CHEK GUIDE) test strip TEST ONCE DAILY 100 each 5   Golimumab (SIMPONI ARIA IV) Inject into the vein.     levothyroxine (SYNTHROID) 88 MCG tablet TAKE 1 TABLET BY MOUTH  DAILY 90 tablet 3    losartan (COZAAR) 50 MG tablet Take 50 mg by mouth 2 (two) times daily.     Magnesium 250 MG TABS Take 250 mg by mouth at bedtime.     metFORMIN (GLUCOPHAGE) 500 MG tablet Take 1 tablet (500 mg total) by mouth daily with breakfast. 90 tablet 3   metoprolol tartrate (LOPRESSOR) 25 MG tablet TAKE 1 TABLET(25 MG) BY MOUTH TWICE DAILY 180 tablet 3   Omega-3 Fatty Acids (FISH OIL) 1200 MG CAPS Take 1,200 mg by mouth in the morning and at bedtime.     pantoprazole (PROTONIX) 40 MG tablet Take 1 tablet (40 mg total) by mouth daily. 90 tablet 3   PARoxetine (PAXIL) 20 MG tablet Take 1 tablet (20 mg total) by mouth in the morning. 90 tablet 3   pioglitazone (ACTOS) 30 MG tablet TAKE 1 TABLET(30 MG) BY MOUTH DAILY 90 tablet 1   sitaGLIPtin (JANUVIA) 100 MG tablet Take 1 tablet (100 mg total) by mouth daily. 90 tablet 3   valACYclovir (VALTREX) 1000 MG tablet Take 1 tablet (1,000 mg total) by mouth daily as needed (Fever Blisters). 180 tablet 0   No facility-administered medications prior to visit.   Allergies  Allergen Reactions   Codeine Nausea And Vomiting and Other (See Comments)    Severe stomach cramps   Antihistamines, Diphenhydramine-Type Other (See Comments)    Causes hyperactivity   Gabapentin     Urinary incontinence   Statins Other (See Comments)    Joint pains   Sulfa Antibiotics     Unknown to pt   Erythromycin Hives and Rash    Due to dental work about 50 years ago. Was used in packing and resulted in rash/hives inside and outside of mouth.   Objective:   Today's Vitals   10/19/20 1550  BP: (!) 150/78  Pulse: 80  Temp: (!) 97.3 F (36.3 C)  TempSrc: Temporal  SpO2: 98%  Weight: 155 lb 9.6 oz (70.6 kg)  Height: '5\' 3"'  (1.6 m)   Body mass index is 27.56 kg/m.   General: Well developed, well nourished. No acute distress. Psych: Alert and oriented. Normal mood and affect.  Health Maintenance Due  Topic Date Due   Hepatitis C Screening  Never done   TETANUS/TDAP  Never  done   Zoster Vaccines- Shingrix (2 of 2) 03/29/2011   FOOT EXAM  03/18/2019   INFLUENZA VACCINE  08/22/2020   Lab Results:  Urinalysis  Component Ref Range & Units 15:49  (10/19/20)  Color, UA  yellow         Clarity, UA  cloudy         Glucose, UA Negative Negative         Bilirubin, UA  negative         Ketones, UA  negative         Spec Grav, UA 1.010 - 1.025 1.020         Blood, UA  +         Comment: 10 Ery/uL  pH, UA 5.0 - 8.0 6.0         Protein, UA Negative Positive Abnormal          Comment: 94m/dL  Urobilinogen, UA 0.2 or 1.0 E.U./dL 0.2         Nitrite, UA  negative         Leukocytes, UA Negative Moderate (2+) Abnormal          Comment: 125 Leu/uL  Appearance            Assessment & Plan:   1. Acute cystitis without hematuria Ms. Carol symptoms and past history area consistent with this being an acute UTI. She has a history of sulfa allergy and does have some moderate CKD (making nitrofurantoin riskier). I would not recommend Cipro due to risks associated with fluroquinolones. I will place her on Augmentin and provide Pyridium for comfort.  - POCT Urinalysis Dipstick - amoxicillin-clavulanate (AUGMENTIN) 500-125 MG tablet; Take 1 tablet (500 mg total) by mouth 2 (two) times daily.  Dispense: 10 tablet; Refill: 0 - phenazopyridine (PYRIDIUM) 100 MG tablet; Take 1 tablet (100 mg total) by mouth 3 (three) times daily as needed for pain.  Dispense: 6 tablet; Refill: 0  SHaydee Salter MD

## 2020-10-19 NOTE — Progress Notes (Signed)
yell 

## 2020-10-20 ENCOUNTER — Ambulatory Visit: Payer: Medicare Other | Admitting: Family Medicine

## 2020-10-26 ENCOUNTER — Other Ambulatory Visit: Payer: Medicare Other | Admitting: *Deleted

## 2020-10-26 ENCOUNTER — Other Ambulatory Visit: Payer: Self-pay

## 2020-10-26 DIAGNOSIS — E1122 Type 2 diabetes mellitus with diabetic chronic kidney disease: Secondary | ICD-10-CM

## 2020-10-26 DIAGNOSIS — R7989 Other specified abnormal findings of blood chemistry: Secondary | ICD-10-CM

## 2020-10-26 DIAGNOSIS — N184 Chronic kidney disease, stage 4 (severe): Secondary | ICD-10-CM

## 2020-10-26 DIAGNOSIS — Z79899 Other long term (current) drug therapy: Secondary | ICD-10-CM

## 2020-10-26 DIAGNOSIS — I2510 Atherosclerotic heart disease of native coronary artery without angina pectoris: Secondary | ICD-10-CM

## 2020-10-27 LAB — BASIC METABOLIC PANEL
BUN/Creatinine Ratio: 18 (ref 12–28)
BUN: 32 mg/dL — ABNORMAL HIGH (ref 8–27)
CO2: 23 mmol/L (ref 20–29)
Calcium: 9.4 mg/dL (ref 8.7–10.3)
Chloride: 100 mmol/L (ref 96–106)
Creatinine, Ser: 1.77 mg/dL — ABNORMAL HIGH (ref 0.57–1.00)
Glucose: 239 mg/dL — ABNORMAL HIGH (ref 70–99)
Potassium: 4.5 mmol/L (ref 3.5–5.2)
Sodium: 138 mmol/L (ref 134–144)
eGFR: 29 mL/min/{1.73_m2} — ABNORMAL LOW (ref >59)

## 2020-10-27 LAB — PRO B NATRIURETIC PEPTIDE: NT-Pro BNP: 2308 pg/mL — ABNORMAL HIGH (ref 0–738)

## 2020-10-28 ENCOUNTER — Encounter: Payer: Self-pay | Admitting: Family Medicine

## 2020-11-01 ENCOUNTER — Other Ambulatory Visit: Payer: Self-pay

## 2020-11-02 ENCOUNTER — Telehealth: Payer: Self-pay | Admitting: Family Medicine

## 2020-11-02 ENCOUNTER — Encounter: Payer: Self-pay | Admitting: Family Medicine

## 2020-11-02 ENCOUNTER — Ambulatory Visit (INDEPENDENT_AMBULATORY_CARE_PROVIDER_SITE_OTHER): Payer: Medicare Other | Admitting: Family Medicine

## 2020-11-02 ENCOUNTER — Other Ambulatory Visit: Payer: Self-pay

## 2020-11-02 VITALS — BP 118/66 | HR 76 | Temp 97.1°F | Ht 63.0 in | Wt 152.8 lb

## 2020-11-02 DIAGNOSIS — B009 Herpesviral infection, unspecified: Secondary | ICD-10-CM

## 2020-11-02 DIAGNOSIS — E559 Vitamin D deficiency, unspecified: Secondary | ICD-10-CM

## 2020-11-02 DIAGNOSIS — E039 Hypothyroidism, unspecified: Secondary | ICD-10-CM | POA: Diagnosis not present

## 2020-11-02 DIAGNOSIS — E1140 Type 2 diabetes mellitus with diabetic neuropathy, unspecified: Secondary | ICD-10-CM | POA: Diagnosis not present

## 2020-11-02 DIAGNOSIS — F411 Generalized anxiety disorder: Secondary | ICD-10-CM

## 2020-11-02 DIAGNOSIS — I10 Essential (primary) hypertension: Secondary | ICD-10-CM

## 2020-11-02 DIAGNOSIS — F5101 Primary insomnia: Secondary | ICD-10-CM | POA: Insufficient documentation

## 2020-11-02 DIAGNOSIS — D508 Other iron deficiency anemias: Secondary | ICD-10-CM | POA: Diagnosis not present

## 2020-11-02 DIAGNOSIS — E538 Deficiency of other specified B group vitamins: Secondary | ICD-10-CM | POA: Diagnosis not present

## 2020-11-02 DIAGNOSIS — Z79899 Other long term (current) drug therapy: Secondary | ICD-10-CM

## 2020-11-02 DIAGNOSIS — E782 Mixed hyperlipidemia: Secondary | ICD-10-CM

## 2020-11-02 DIAGNOSIS — I2510 Atherosclerotic heart disease of native coronary artery without angina pectoris: Secondary | ICD-10-CM

## 2020-11-02 LAB — URINALYSIS, ROUTINE W REFLEX MICROSCOPIC
Bilirubin Urine: NEGATIVE
Hgb urine dipstick: NEGATIVE
Ketones, ur: NEGATIVE
Nitrite: NEGATIVE
Specific Gravity, Urine: 1.01 (ref 1.000–1.030)
Total Protein, Urine: 30 — AB
Urine Glucose: NEGATIVE
Urobilinogen, UA: 0.2 (ref 0.0–1.0)
pH: 6 (ref 5.0–8.0)

## 2020-11-02 LAB — CBC
HCT: 38.6 % (ref 36.0–46.0)
Hemoglobin: 12.8 g/dL (ref 12.0–15.0)
MCHC: 33.1 g/dL (ref 30.0–36.0)
MCV: 90.9 fl (ref 78.0–100.0)
Platelets: 224 10*3/uL (ref 150.0–400.0)
RBC: 4.25 Mil/uL (ref 3.87–5.11)
RDW: 15.8 % — ABNORMAL HIGH (ref 11.5–15.5)
WBC: 6.6 10*3/uL (ref 4.0–10.5)

## 2020-11-02 LAB — LIPID PANEL
Cholesterol: 258 mg/dL — ABNORMAL HIGH (ref 0–200)
HDL: 42.7 mg/dL (ref >39.00)
NonHDL: 215.71
Total CHOL/HDL Ratio: 6
Triglycerides: 214 mg/dL — ABNORMAL HIGH (ref 0.0–149.0)
VLDL: 42.8 mg/dL — ABNORMAL HIGH (ref 0.0–40.0)

## 2020-11-02 LAB — TSH: TSH: 13.43 u[IU]/mL — ABNORMAL HIGH (ref 0.35–5.50)

## 2020-11-02 LAB — MICROALBUMIN / CREATININE URINE RATIO
Creatinine,U: 115.8 mg/dL
Microalb Creat Ratio: 29 mg/g (ref 0.0–30.0)
Microalb, Ur: 33.6 mg/dL — ABNORMAL HIGH (ref 0.0–1.9)

## 2020-11-02 LAB — BASIC METABOLIC PANEL
BUN: 30 mg/dL — ABNORMAL HIGH (ref 6–23)
CO2: 26 mEq/L (ref 19–32)
Calcium: 9.8 mg/dL (ref 8.4–10.5)
Chloride: 101 mEq/L (ref 96–112)
Creatinine, Ser: 1.92 mg/dL — ABNORMAL HIGH (ref 0.40–1.20)
GFR: 24.76 mL/min — ABNORMAL LOW (ref >60.00)
Glucose, Bld: 139 mg/dL — ABNORMAL HIGH (ref 70–99)
Potassium: 5.3 mEq/L — ABNORMAL HIGH (ref 3.5–5.1)
Sodium: 139 mEq/L (ref 135–145)

## 2020-11-02 LAB — HEMOGLOBIN A1C: Hgb A1c MFr Bld: 7.2 % — ABNORMAL HIGH (ref 4.6–6.5)

## 2020-11-02 LAB — VITAMIN D 25 HYDROXY (VIT D DEFICIENCY, FRACTURES): VITD: 49.46 ng/mL (ref 30.00–100.00)

## 2020-11-02 LAB — VITAMIN B12: Vitamin B-12: 400 pg/mL (ref 211–911)

## 2020-11-02 LAB — LDL CHOLESTEROL, DIRECT: Direct LDL: 172 mg/dL

## 2020-11-02 MED ORDER — BLOOD GLUCOSE METER KIT: PACK | 0 refills | Status: AC

## 2020-11-02 MED ORDER — TRAZODONE HCL 50 MG PO TABS: 25.0000 mg | ORAL_TABLET | Freq: Every evening | ORAL | 6 refills | Status: DC | PRN

## 2020-11-02 MED ORDER — EMPAGLIFLOZIN 10 MG PO TABS: 10.0000 mg | ORAL_TABLET | Freq: Every day | ORAL | 2 refills | Status: DC

## 2020-11-02 NOTE — Addendum Note (Signed)
Addended by: [name] on: 11/02/2020 04:42 PM   Modules accepted: Orders

## 2020-11-02 NOTE — Chronic Care Management (AMB) (Signed)
  Chronic Care Management   Note  11/02/2020 Name: [name] MRN: [id number] DOB: April 21, 1940  [name] is a 80 y.o. year old female who is a primary care patient of Gena Fray, Lillette Boxer, MD. I reached out to Vicki Mallet by phone today in response to a referral sent by [name]'s PCP, Gena Fray Lillette Boxer, MD.   [name] was given information about Chronic Care Management services today including:  CCM service includes personalized support from designated clinical staff supervised by her physician, including individualized plan of care and coordination with other care providers 24/7 contact phone numbers for assistance for urgent and routine care needs. Service will only be billed when office clinical staff spend 20 minutes or more in a month to coordinate care. Only one practitioner may furnish and bill the service in a calendar month. The patient may stop CCM services at any time (effective at the end of the month) by phone call to the office staff.   Patient agreed to services and verbal consent obtained.   Follow up plan:   Tatjana Secretary/administrator

## 2020-11-02 NOTE — Progress Notes (Signed)
[facility] [address] [place] [address] Dept: [phone] Dept Fax: [phone]  Transfer of Care Office Visit  Subjective:    Patient ID: [name], female    DOB: 1940/04/09, 80 y.o..   MRN: [id number]  Chief Complaint  Patient presents with   [place]- establish care.  DM    History of Present Illness:  Patient is in today to establish care. [name] was born in [place], [place]. She has lived in this area all of her life. She has been married fro 26 years. She had three children, but had a daughter die by suicide (age 61). She has 7 grandchildren. She is retired from Engineer, [employer]. [name] is a former smoker that quit in 1999. She drinks about 4 oz. opf wine 5 times a week. She denies drug use.  [name] has a past history of aortic stenosis and coronary artery disease. She had CABG x 2 and aortic valve replacement in 04/2020. She also has peripheral artery disease, having had two stents placed in the right leg. However, due to complications, the left leg was not able to be addressed. She also has hypertension and hyperlipidemia.  She does have claudication after walking for about 50 feet. She is managed on aspirin, furosemide, metoprolol, and losartan. She has been intolerant of statins and stopped Repatha on her on. Currently she takes a fish oil supplement.  [name] has a history of GERD, managed on Protonix.  [name] has a history of Type 2 diabetes. She is managed on glipizide, metformin, pioglitazone, and sitagliptin. Her cardiologist recently raised a concern that the pioglitazone may be contributing to edema and asked about considering stopping this. She has some chronic kidney disease  and neuropathy as well.  [name] has a number of arthritic complaints. She primarily uses Tylemol for management.  [name] has had prior right breast cancer. She had a lumpectomy, followed  by radiaitona nd chemotherapy. She notes she has an upcoming mammogram.  [name] has had a prior history of anemia, with iron and B12 deficiencies.  [name] notes that she is having issues with episodic insomnia. She notes some nights she cannot get to sleep before 4 or 5 am. She usually responds by getting up and going to watch TV. She would like to try a mediciotn for this, but wants to avoid anything addicting.  Past Medical History: Patient Active Problem List   Diagnosis Date Noted   Primary insomnia 11/02/2020   Irritation of eyelid 07/26/2020   S/P aortic valve replacement with bioprosthetic valve 05/04/2020   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   PAD (peripheral artery disease) (HCC)    Rheumatoid arthritis ([place]) 04/07/2019   Low back pain 02/03/2019   Scoliosis deformity of spine 02/03/2019   Osteoarthritis of hip 11/17/2018   Claudication ([facility])    Chronic headaches 11/12/2017   Iron deficiency anemia 11/12/2017   Vitamin D deficiency 09/10/2017   Vitamin B12 deficiency 09/10/2017   Degeneration of lumbar intervertebral disc 06/13/2017   Hyperkalemia 06/03/2017   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[place])    Mass of left adrenal gland ([place]) 04/19/2017   Spinal stenosis of lumbar region 10/20/2014   Hyperlipidemia 03/16/2013   Dense breasts 12/22/2012   Anxiety state 04/16/2012   Arthropathy of hand 04/16/2012   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   Osteoarthritis of knee 04/16/2012  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 female breast ([place]) 12/20/2010   Depression 09/14/2008   GERD 09/14/2008   Type 2 diabetes mellitus with diabetic neuropathy, without long-term current use of insulin ([place]) 05/20/2008   Primary hypertension 04/17/2007   Coronary atherosclerosis 04/17/2007   Hypothyroidism 04/03/2007   Past Surgical History:  Procedure Laterality Date   ABDOMINAL AORTOGRAM W/LOWER EXTREMITY Bilateral 04/29/2019   Procedure: ABDOMINAL  AORTOGRAM W/LOWER EXTREMITY;  Surgeon: Marty Heck, MD;  Location: [facility];  Service: Cardiovascular;  Laterality: Bilateral;   ABDOMINAL AORTOGRAM W/LOWER EXTREMITY Left 07/01/2019   Procedure: ABDOMINAL AORTOGRAM W/ Left LOWER EXTREMITY [place];  Surgeon: Marty Heck, MD;  Location: [facility];  Service: Cardiovascular;  Laterality: Left;   ANTERIOR AND POSTERIOR REPAIR N/A 04/05/2016   Procedure: ANTERIOR (CYSTOCELE) AND POSTERIOR REPAIR (RECTOCELE);  Surgeon: [name], MD;  Location: [facility];  Service: Gynecology;  Laterality: N/A;   AORTIC VALVE REPLACEMENT N/A 05/04/2020   Procedure: AORTIC VALVE REPLACEMENT (AVR) USING INSPIRIS 23MM RESILIA AORTIC VALVE;  Surgeon: Rexene Alberts, MD;  Location: [facility];  Service: Open Heart Surgery;  Laterality: N/A;   BREAST BIOPSY     BREAST LUMPECTOMY  1983   benign biopsy   BREAST LUMPECTOMY Right 12/29/2010   snbx, ER/PR +, Her2 -, 0/1 node pos.   CARPAL TUNNEL RELEASE Bilateral 9/12,8,12   CHOLECYSTECTOMY     COLONOSCOPY N/A 02/09/2013   Procedure: COLONOSCOPY;  Surgeon: [name], MD;  Location: [facility];  Service: Endoscopy;  Laterality: N/A;   COLONOSCOPY     CORONARY ANGIOPLASTY     CORONARY ARTERY BYPASS GRAFT N/A 05/04/2020   Procedure: CORONARY ARTERY BYPASS GRAFTING (CABG), ON PUMP, TIMES TWO, USING LEFT INTERNAL MAMMARY ARTERY AND RIGHT ENDOSCOPICALLY HARVESTED GREATER SAPHENOUS VEIN;  Surgeon: Rexene Alberts, MD;  Location: [facility];  Service: Open Heart Surgery;  Laterality: N/A;   DILATION AND CURETTAGE OF UTERUS     ESOPHAGOGASTRODUODENOSCOPY N/A 02/09/2013   Procedure: ESOPHAGOGASTRODUODENOSCOPY (EGD);  Surgeon: [name], MD;  Location: [name];  Service: Endoscopy;  Laterality: N/A;   FOOT SPURS     HYSTEROSCOPY WITH D & C N/A 09/11/2012   Procedure: DILATATION AND CURETTAGE ;  Surgeon: [name], MD;  Location: [facility];  Service: Gynecology;  Laterality: N/A;   KNEE SURGERY   2007   LAPAROSCOPIC ASSISTED VAGINAL HYSTERECTOMY N/A 04/05/2016   Procedure: LAPAROSCOPIC ASSISTED VAGINAL HYSTERECTOMY possible BSO;  Surgeon: [name], MD;  Location: [facility];  Service: Gynecology;  Laterality: N/A;   LUMBAR LAMINECTOMY/DECOMPRESSION MICRODISCECTOMY N/A 10/20/2014   Procedure: MICRO LUMBER DECOMPRESSION L3-4 L4-5;  Surgeon: [name], MD;  Location: [facility];  Service: Orthopedics;  Laterality: N/A;   LUMBAR LAMINECTOMY/DECOMPRESSION MICRODISCECTOMY Bilateral 10/13/2015   Procedure: MICRO LUMBAR DECOMPRESSION L5 - S1 AND REDO DECOMPRESSION L4 - L5 AND REMOVAL OF FACET CYST L4 - L5 2 LEVELS;  Surgeon: [name], MD;  Location: [facility];  Service: Orthopedics;  Laterality: Bilateral;   PERIPHERAL VASCULAR INTERVENTION Right 04/29/2019   Procedure: PERIPHERAL VASCULAR INTERVENTION;  Surgeon: Marty Heck, MD;  Location: [facility];  Service: Cardiovascular;  Laterality: Right;   PORT-A-CATH REMOVAL  04/17/2011   Procedure: REMOVAL PORT-A-CATH;  Surgeon: [name], MD;  Location: [facility];  Service: General;  Laterality: Left;   PORTACATH PLACEMENT  02/07/2011   Procedure: INSERTION PORT-A-CATH;  Surgeon: [name], MD;  Location: [facility];  Service: General;  Laterality: N/A;   RIGHT/LEFT HEART CATH AND CORONARY ANGIOGRAPHY N/A 03/17/2020   Procedure: RIGHT/LEFT HEART CATH AND CORONARY ANGIOGRAPHY;  Surgeon: Burnell Blanks, MD;  Location: [facility];  Service: Cardiovascular;  Laterality: N/A;   TEE WITHOUT CARDIOVERSION N/A 05/04/2020   Procedure: TRANSESOPHAGEAL ECHOCARDIOGRAM (TEE);  Surgeon: Rexene Alberts, MD;  Location: [facility];  Service: Open Heart Surgery;  Laterality: N/A;   UPPER GASTROINTESTINAL ENDOSCOPY     Family History  Problem Relation Age of Onset   Kidney disease Mother    Heart disease Mother    Throat cancer Father    Alcoholism Father    Heart attack Father    Lymphoma Brother 5   Heart  attack Brother    Diabetes Brother    Hypertension Brother    Cancer Son    Colon cancer Neg Hx    Stroke Neg Hx    Esophageal cancer Neg Hx    Rectal cancer Neg Hx    Stomach cancer Neg Hx    Outpatient Medications Prior to Visit  Medication Sig Dispense Refill   Accu-Chek FastClix Lancets MISC USE TO CHECK BLOOD SUGARS 3 TIMES DAILY 306 each 3   acetaminophen (TYLENOL) 500 MG tablet Take 1,000 mg by mouth every 6 (six) hours as needed for moderate pain or headache.     aspirin EC 81 MG tablet Take 81 mg by mouth at bedtime.     Blood Glucose Monitoring Suppl DEVI by Does not apply route. Accu Check Guide     calcium carbonate (TUMS - DOSED IN MG ELEMENTAL CALCIUM) 500 MG chewable tablet Chew 2 tablets by mouth 3 (three) times daily as needed for indigestion or heartburn.     Cholecalciferol (VITAMIN D3) 25 MCG (1000 UT) CAPS Take 1,000 Units by mouth daily before lunch.     furosemide (LASIX) 20 MG tablet Take 1 tablet (20 mg total) by mouth daily. 90 tablet 3   glucose blood (ACCU-CHEK GUIDE) test strip TEST ONCE DAILY 100 each 5   Golimumab (SIMPONI ARIA IV) Inject into the vein.     levothyroxine (SYNTHROID) 88 MCG tablet TAKE 1 TABLET BY MOUTH  DAILY 90 tablet 3   losartan (COZAAR) 50 MG tablet Take 50 mg by mouth 2 (two) times daily.     Magnesium 250 MG TABS Take 250 mg by mouth at bedtime.     metFORMIN (GLUCOPHAGE) 500 MG tablet Take 1 tablet (500 mg total) by mouth daily with breakfast. 90 tablet 3   metoprolol tartrate (LOPRESSOR) 25 MG tablet TAKE 1 TABLET(25 MG) BY MOUTH TWICE DAILY 180 tablet 3   Omega-3 Fatty Acids (FISH OIL) 1200 MG CAPS Take 1,200 mg by mouth in the morning and at bedtime.     pantoprazole (PROTONIX) 40 MG tablet Take 1 tablet (40 mg total) by mouth daily. 90 tablet 3   PARoxetine (PAXIL) 20 MG tablet Take 1 tablet (20 mg total) by mouth in the morning. 90 tablet 3   sitaGLIPtin (JANUVIA) 100 MG tablet Take 1 tablet (100 mg total) by mouth daily. 90  tablet 3   valACYclovir (VALTREX) 1000 MG tablet Take 1 tablet (1,000 mg total) by mouth daily as needed (Fever Blisters). 180 tablet 0   glipiZIDE (GLUCOTROL XL) 10 MG 24 hr tablet TAKE 1 TABLET BY MOUTH  DAILY AT 12 NOON 90 tablet 0   pioglitazone (ACTOS) 30 MG tablet TAKE 1 TABLET(30 MG) BY MOUTH DAILY 90 tablet 1   amoxicillin-clavulanate (  AUGMENTIN) 500-125 MG tablet Take 1 tablet (500 mg total) by mouth 2 (two) times daily. (Patient not taking: Reported on 11/02/2020) 10 tablet 0   phenazopyridine (PYRIDIUM) 100 MG tablet Take 1 tablet (100 mg total) by mouth 3 (three) times daily as needed for pain. 6 tablet 0   No facility-administered medications prior to visit.   Allergies  Allergen Reactions   Codeine Nausea And Vomiting and Other (See Comments)    Severe stomach cramps   Antihistamines, Diphenhydramine-Type Other (See Comments)    Causes hyperactivity   Gabapentin     Urinary incontinence   Statins Other (See Comments)    Joint pains   Sulfa Antibiotics     Unknown to pt   Erythromycin Hives and Rash    Due to dental work about 50 years ago. Was used in packing and resulted in rash/hives inside and outside of mouth.   Objective:   Today's Vitals   11/02/20 0837  BP: 118/66  Pulse: 76  Temp: (!) 97.1 F (36.2 C)  TempSrc: Temporal  SpO2: 95%  Weight: 152 lb 12.8 oz (69.3 kg)  Height: '5\' 3"'  (1.6 m)   Body mass index is 27.07 kg/m.   General: Well developed, well nourished. No acute distress. Extremities: No edema noted. Psych: Alert and oriented. Normal mood and affect.  Health Maintenance Due  Topic Date Due   Hepatitis C Screening  Never done   TETANUS/TDAP  Never done   Zoster Vaccines- Shingrix (2 of 2) 03/29/2011   FOOT EXAM  03/18/2019   INFLUENZA VACCINE  08/22/2020   Lab Results Lab Results  Component Value Date   HGBA1C 7.7 (H) 07/20/2020   Lab Results  Component Value Date   CHOL 195 07/20/2020   HDL 36.20 (L) 07/20/2020   LDLCALC 83  05/13/2008   LDLDIRECT 113.0 07/20/2020   TRIG 236.0 (H) 07/20/2020   CHOLHDL 5 07/20/2020   Lab Results  Component Value Date   TSH 10.600 (H) 08/24/2020    Imaging Echocardiogram (06/15/2020) IMPRESSIONS   1. Left ventricular ejection fraction, by estimation, is 60 to 65%. The left ventricle has normal function. The left ventricle has no regional wall motion abnormalities. Left ventricular diastolic parameters are consistent with Grade I diastolic dysfunction (impaired relaxation). The average left ventricular global longitudinal strain is -17.3 %. The global longitudinal strain is normal.   2. Right ventricular systolic function is normal. The right ventricular size is normal.   3. The mitral valve is normal in structure. Moderate mitral valve regurgitation. No evidence of mitral stenosis.   4. The aortic valve has been repaired/replaced. Aortic valve regurgitation is not visualized. No aortic stenosis is present. There is a 23 mm Edwards Inspiris Resilia valve present in the aortic position. Procedure Date: 05/04/20. Echo findings are consistent with normal structure and function of the aortic valve prosthesis. Aortic valve mean gradient measures 4.0 mmHg. Aortic valve  Vmax measures 1.30 m/s.   5. The inferior vena cava is normal in size with greater than 50% respiratory variability, suggesting right atrial pressure of 3 mmHg.     Assessment & Plan:   1. Essential hypertension Blood pressure is at goal. Continue losartan.  2. Atherosclerosis of native coronary artery of native heart without angina pectoris Stable, but Ms. Tatum remains at high risk, esp. as she has not tolerated approaches to addressing her hyperlipidemia. She will continue to follow with cardiology.  3. Hypothyroidism, unspecified type Ms. Hibler last TSH was elevated in early  August. The lab was ordered by Dr. Johnsie Cancel, who deferred management to her PCP. It is not clear that this has been addressed. I will recheck  her TSH today. If this remains elevated,w e will increase her levothyroxine dose.  - TSH  4. Type 2 diabetes mellitus with diabetic neuropathy, without long-term current use of insulin [facility]) Ms. Coppess is due for diabetes labs. We did discuss that edema can be a side effect of pioglitazone. It would be reasonable to stop this med at this point. As well, glipizide is riskier in patients over 65, so I advise we stop this as well. Instead, we will add empagliflozin, as this could benefit her CHF. I will plan to reassess her in 3 months.  - Lipid panel - Microalbumin / creatinine urine ratio - Basic metabolic panel - Hemoglobin A1c - Urinalysis, Routine w reflex microscopic - blood glucose meter kit and supplies; Dispense based on patient and insurance preference. Use up to four times daily as directed. (FOR ICD-10 E10.9, E11.9).  Dispense: 1 each; Refill: 0 - empagliflozin (JARDIANCE) 10 MG TABS tablet; Take 1 tablet (10 mg total) by mouth daily before breakfast.  Dispense: 30 tablet; Refill: 2  5. Vitamin D deficiency In light of past deficiency, I will recheck Vitamin D level.  - VITAMIN D 25 Hydroxy (Vit-D Deficiency, Fractures)  6. Vitamin B12 deficiency In light of past deficiency, I will recheck Vitamin B12 level. - Vitamin B12  7. Other iron deficiency anemia I will recheck iron studies and CBC to determine if this remains an issue.  - CBC - Iron, TIBC and Ferritin Panel  8. Primary insomnia We discussed sleep hygiene issues. We will try a low dose of trazodone to see if this improves her sleep.  - traZODone (DESYREL) 50 MG tablet; Take 0.5 tablets (25 mg total) by mouth at bedtime as needed for sleep.  Dispense: 30 tablet; Refill: 6  [name], MD

## 2020-11-03 LAB — IRON,TIBC AND FERRITIN PANEL
%SAT: 12 % (calc) — ABNORMAL LOW (ref 16–45)
Ferritin: 75 ng/mL (ref 16–288)
Iron: 43 ug/dL — ABNORMAL LOW (ref 45–160)
TIBC: 368 mcg/dL (calc) (ref 250–450)

## 2020-11-04 ENCOUNTER — Telehealth: Payer: Self-pay | Admitting: Family Medicine

## 2020-11-04 MED ORDER — ACCU-CHEK GUIDE VI STRP: ORAL_STRIP | 5 refills | Status: AC

## 2020-11-04 NOTE — Telephone Encounter (Signed)
Rx sent to the pharmacy. Dm/cma  

## 2020-11-04 NOTE — Telephone Encounter (Signed)
[facility] is needs a new script for New accu check guide test strps and # of times she is to test per day. A script that also say softclix lancets and the # of times she is to test per day per insurance [address] [place], [place] [facility]  [address], [place] [address]  Phone:  [phone]  Fax:  [phone]  DEA #:  [id number]

## 2020-11-17 NOTE — Progress Notes (Signed)
Patient ID: [name]                 DOB: [birth year]                    MRN: [id number]     HPI: [name] is a 80 y.o. female patient referred to lipid clinic by Dr. Johnsie Cancel on 9/14. PMH is significant for Coronary artery disease with aortic stenosis (s/p CABG + AVR 4/22 and S/p DES to RCA in 2007), HTN, T2DM, CKD, HLD w/ statin intolerance, PAD s/p R SFA stent, GERD.  On 7/29 via phone note, patient reported she had stopped taking both her furosemide and Repatha. She stated the furosemide had too many side effects: dry eyes, gaining weight, and runny nose. She said re: Repatha she felt great right after surgery and then when she started taking Repatha, she "just felt awful". PharmD counseled cold and flu like symptoms are a common side effect after first starting Repatha.  On 8/3, Dr. Johnsie Cancel advised to stop Repatha and retry at a later date.  Today, patient presents to [facility] clinic to discuss management options. She currently only takes an OTC fish oil capsule. She recalls being on Crestor and Lipitor in the past and notes that they caused her muscle pain. She states she does not remember why she stopped taking Repatha and inquires about restarting it. Her husband also takes Repatha and their copay is ~$37. She reports she gets tired walking due to PAD requiring frequent breaks. She performs seated exercises 3-4x a day for 5 minutes a day to improve blood flow to her legs.   Current Medications: fish oil 1200 mg BID Intolerances: Repatha, ezetimibe 10 mg daily, Livalo 2 mg daily, colesevelam 2500 mg daily, cholestyramine 4 g packet BID Risk Factors: Progressive ASCVD, diabetes, CKD LDL goal: LDL <55  Diet: Breakfast - special K for breakfast, 2-3 eggs, 1 strip of bacon - few times a week Lunch - half of ham sandwich with mayonnaise and mustard, glass of water, tacos and soft drink- 2x a week Dinner - barbecue chicken, fried porkchops - no longer fries every meats, corn, peas, and beans  from vegetable garden Eats pasta potatoes On Sundays, eats fried chicken, broccoli casserole, gravy, potato salad, green beans with family Adds salt to food and uses light olive oil Snacks at night - yogurt, few table spoons of ice cream, cheetos or popcorn every night  Exercise: limited mobility due to PAD, keeps 40-monthson regularly - any movement in legs recommended by vascular - 3-4 times a day - 5 minutes - husband   Family History: family history includes Alcoholism in her father; Cancer in her son; Diabetes in her brother; Heart attack in her brother and father; Heart disease in her mother; Hypertension in her brother; Kidney disease in her mother; Lymphoma (age of onset: 693 in her brother; Throat cancer in her father.   Social History:  reports that she quit smoking about 22 years ago. Her smoking use included cigarettes. She has a 80.00 pack-year smoking history. She has never used smokeless tobacco. She reports current alcohol use of about 1.0 standard drink per week. She reports that she does not use drugs.  Labs: 11/02/20 A1c 7.2% LDL 172 TC 258 TG 214 HDL 42 (fish oil) 07/20/20 LDL 113 TC 195 TG 236 HDL 36 (Repatha, fish oil) Past Medical History:  Diagnosis Date   Allergy    Anemia    childhood  Anxiety    Aortic stenosis    Arterial stenosis (HCC)    mesenteric   Arthritis    Breast cancer ([place]) 12/05/10   R breast, inv mammary, in situ,ER/PR +,HER2 -   Cancer ([place])    Carcinoma of breast treated with adjuvant chemotherapy ([place])    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[place])    Claudication ([place])    Coronary artery disease    stent 2007   Depression    Diabetes mellitus    Difficulty sleeping    Diverticulosis 12/10/03   Elevated cholesterol    Generalized weakness    GERD (gastroesophageal reflux disease)    Heart murmur    Hepatitis    as an infant   HSV-1 (herpes simplex virus 1) infection    Hyperlipidemia    Hypertension    Hypothyroidism     Osteoarthritis    PAD (peripheral artery disease) ([place])    Personal history of chemotherapy    Personal history of radiation therapy    Pneumonia    2014 september and March 2022   Rheumatoid arthritis ([place])    S/P aortic valve replacement with bioprosthetic valve 05/04/2020   Edwards Inspiris Resilia stented bovine pericardial tissue valve, size 23 mm   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   LIMA to D3, SVG to PDA, EVH via right thigh   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     aspirin EC 81 MG tablet Take 81 mg by mouth at bedtime.     blood glucose meter kit and supplies Dispense based on patient and insurance preference. Use up to four times daily as directed. (FOR ICD-10 E10.9, E11.9). 1 each 0   Blood Glucose Monitoring Suppl DEVI by Does not apply route. Accu Check Guide     calcium carbonate (TUMS - DOSED IN MG ELEMENTAL CALCIUM) 500 MG chewable tablet Chew 2 tablets by mouth 3 (three) times daily as needed for indigestion or heartburn.     Cholecalciferol (VITAMIN D3) 25 MCG (1000 UT) CAPS Take 1,000 Units by mouth daily before lunch.     empagliflozin (JARDIANCE) 10 MG TABS tablet Take 1 tablet (10 mg total) by mouth daily before breakfast. 30 tablet 2   furosemide (LASIX) 20 MG tablet Take 1 tablet (20 mg total) by mouth daily. 90 tablet 3   glucose blood (ACCU-CHEK GUIDE) test strip TEST ONCE DAILY 100 each 5   glucose blood test strip 1 each by Other route as needed for other. Use as instructed  Please dispense Accu check Guide Test strips     Golimumab ([place] ARIA IV) Inject into the vein.     levothyroxine (SYNTHROID) 88 MCG tablet TAKE 1  TABLET BY MOUTH  DAILY 90 tablet 3   losartan (COZAAR) 50 MG tablet Take 50 mg by mouth 2 (two) times daily.     Magnesium 250 MG TABS Take 250 mg by mouth at bedtime.     metFORMIN (GLUCOPHAGE) 500 MG tablet Take 1 tablet (500 mg total) by mouth daily with breakfast. 90 tablet 3  metoprolol tartrate (LOPRESSOR) 25 MG tablet TAKE 1 TABLET(25 MG) BY MOUTH TWICE DAILY 180 tablet 3   Omega-3 Fatty Acids (FISH OIL) 1200 MG CAPS Take 1,200 mg by mouth in the morning and at bedtime.     pantoprazole (PROTONIX) 40 MG tablet Take 1 tablet (40 mg total) by mouth daily. 90 tablet 3   PARoxetine (PAXIL) 20 MG tablet Take 1 tablet (20 mg total) by mouth in the morning. 90 tablet 3   sitaGLIPtin (JANUVIA) 100 MG tablet Take 1 tablet (100 mg total) by mouth daily. 90 tablet 3   traZODone (DESYREL) 50 MG tablet Take 0.5 tablets (25 mg total) by mouth at bedtime as needed for sleep. 30 tablet 6   valACYclovir (VALTREX) 1000 MG tablet Take 1 tablet (1,000 mg total) by mouth daily as needed (Fever Blisters). 180 tablet 0   No current facility-administered medications on file prior to visit.    Allergies  Allergen Reactions   Codeine Nausea And Vomiting and Other (See Comments)    Severe stomach cramps   Antihistamines, Diphenhydramine-Type Other (See Comments)    Causes hyperactivity   Gabapentin     Urinary incontinence   Statins Other (See Comments)    Joint pains   Sulfa Antibiotics     Unknown to pt   Erythromycin Hives and Rash    Due to dental work about 50 years ago. Was used in packing and resulted in rash/hives inside and outside of mouth.    Assessment/Plan:  1. Hyperlipidemia - Patient's most recent LDL (172) is above goal of <55 and TG are elevated. Repatha, Praluent, and Leqvio were discussed at this visit. Patient prefers retrial with Repatha since she doesn't remember reason for discontinuing. Will send new rx for Repatha. Will also send new rx for Vascepa 2 g BID with copay of $7.  Follow up in 2 months with fasting lipid panel.   Counseled patient to limit soda to once a week and cut back on Cheetos to every other day. Encouraged patient to increase amount of seated exercise due to limited mobility, and to try to walk in at least >5 minute increments.   Thank you,  Cyd Silence  Pharm D. Candidate  [facility] [place].D, BCPS, CPP [place]  [address], [place], [place] [address]  Phone: [phone]; Fax: [phone]

## 2020-11-18 ENCOUNTER — Ambulatory Visit (INDEPENDENT_AMBULATORY_CARE_PROVIDER_SITE_OTHER): Payer: Medicare Other | Admitting: Pharmacist

## 2020-11-18 ENCOUNTER — Other Ambulatory Visit: Payer: Self-pay

## 2020-11-18 DIAGNOSIS — I10 Essential (primary) hypertension: Secondary | ICD-10-CM | POA: Diagnosis not present

## 2020-11-18 DIAGNOSIS — E785 Hyperlipidemia, unspecified: Secondary | ICD-10-CM | POA: Diagnosis not present

## 2020-11-18 MED ORDER — REPATHA SURECLICK 140 MG/ML ~~LOC~~ SOAJ: 1.0000 "pen " | SUBCUTANEOUS | 11 refills | Status: DC

## 2020-11-18 MED ORDER — ICOSAPENT ETHYL 1 G PO CAPS: 2.0000 g | ORAL_CAPSULE | Freq: Two times a day (BID) | ORAL | 11 refills | Status: DC

## 2020-11-18 NOTE — Patient Instructions (Signed)
It was nice to meet you today. Your LDL is above goal of <55. Your most recent LDL was 172.    STOP fish oil capsules.  We will re-submit a PA for Repatha and Vascepa 2 g (two tablets) twice a day and give you a call once approved.   Follow up in 2 months for fasting lab work.   Try to use extra virgin olive oil in a glass bottle. Try to cut down on Cheetos to a few times a week. Try to cut soda down to once a week if at all.   Aim to get at least 150 minutes total per week of walking or seated exercise in at least 5-minute increments.

## 2020-11-25 ENCOUNTER — Encounter: Payer: Self-pay | Admitting: Family Medicine

## 2020-12-19 ENCOUNTER — Other Ambulatory Visit: Payer: Self-pay

## 2020-12-19 ENCOUNTER — Ambulatory Visit (INDEPENDENT_AMBULATORY_CARE_PROVIDER_SITE_OTHER): Payer: Medicare Other | Admitting: Family Medicine

## 2020-12-19 ENCOUNTER — Telehealth: Payer: Self-pay

## 2020-12-19 VITALS — BP 138/68 | HR 86 | Temp 97.6°F | Ht 63.0 in | Wt 152.8 lb

## 2020-12-19 DIAGNOSIS — E1140 Type 2 diabetes mellitus with diabetic neuropathy, unspecified: Secondary | ICD-10-CM | POA: Diagnosis not present

## 2020-12-19 DIAGNOSIS — E039 Hypothyroidism, unspecified: Secondary | ICD-10-CM | POA: Diagnosis not present

## 2020-12-19 DIAGNOSIS — E1142 Type 2 diabetes mellitus with diabetic polyneuropathy: Secondary | ICD-10-CM | POA: Diagnosis not present

## 2020-12-19 DIAGNOSIS — M064 Inflammatory polyarthropathy: Secondary | ICD-10-CM | POA: Insufficient documentation

## 2020-12-19 MED ORDER — LEVOTHYROXINE SODIUM 100 MCG PO TABS: 100.0000 ug | ORAL_TABLET | Freq: Every day | ORAL | 3 refills | Status: DC

## 2020-12-19 MED ORDER — GLIPIZIDE ER 10 MG PO TB24: 10.0000 mg | ORAL_TABLET | Freq: Every day | ORAL | 3 refills | Status: DC

## 2020-12-19 MED ORDER — AMITRIPTYLINE HCL 10 MG PO TABS: 10.0000 mg | ORAL_TABLET | Freq: Every day | ORAL | 2 refills | Status: DC

## 2020-12-19 NOTE — Patient Instructions (Signed)
Check fasting blood sugars daily. Call me and report if fasting blood sugars are under 80.

## 2020-12-19 NOTE — Telephone Encounter (Signed)
Patient is having shin pain especially on the left. No pain or ulcers on her feet and says it has been kinda puffed out for 2-3 years. The pain is all the time but worse when she walks. Similar to the pain she's been having. She thinks "it is near the bone, but feels more like muscle when I mash on it." Advised her to discuss with her medical doctor and keep follow up. She will call back if further issues.

## 2020-12-19 NOTE — Progress Notes (Signed)
[facility] [address] [place] [address] Dept: [phone] Dept Fax: [phone]  Office Visit  Subjective:    Patient ID: [name], female    DOB: 1940-11-11, 80 y.o..   MRN: [id number]  Chief Complaint  Patient presents with   Acute Visit    C/o having bilateral shin pain x years, LT worse than RT. Had [place] on leg no DVT.    Elevated BS 2 hours after eating  280.    History of Present Illness:  Patient is in today complaining of bilateral lower leg shin pain. She notes that this is throbbing in quality and hurts with any ambulation. [name] has a history of PAD that was partially corrected with two stents in her right leg, but never completely able to be addressed. She called and discussed her issues with the Vein clinic earlier today. They felt her symptoms sounded more like neuropathy issues rather than claudication. [name] had tried gabapentin before, but apparently had a reaction to this.  [name] has a history of Type 2 diabetes. She was managed on glipizide, metformin, pioglitazone, and sitagliptin. Her cardiologist recently raised a concern that the pioglitazone may be contributing to edema and asked about considering stopping this. She has some chronic kidney disease and neuropathy as well. At her last visit, we stopped her glipizde and her pioglitazone and added empagliflozin (Jardiance). She has noted some increased urination related to this, but feels she is otherwise tolerating this. Since the change, she notes her post-prandial glucoses are running around 280.  [name] has a history of hypothyroidism. She notes the cardiologist had seen an increase in her TSH and recommended she review this with her PCP.  Past Medical History: Patient Active Problem List   Diagnosis Date Noted   Diabetic peripheral neuropathy ([place]) 12/19/2020   Primary insomnia 11/02/2020   Irritation of eyelid  07/26/2020   S/P aortic valve replacement with bioprosthetic valve 05/04/2020   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   PAD (peripheral artery disease) (HCC)    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[place]) 04/07/2019   Low back pain 02/03/2019   Scoliosis deformity of spine 02/03/2019   Osteoarthritis of hip 11/17/2018   Claudication ([place])    Chronic headaches 11/12/2017   Iron deficiency anemia 11/12/2017   Degeneration of lumbar intervertebral disc 06/13/2017   Hyperkalemia 06/03/2017   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[place])    Mass of left adrenal gland ([place]) 04/19/2017   Spinal stenosis of lumbar region 10/20/2014   Hyperlipidemia 03/16/2013   Dense breasts 12/22/2012   Anxiety state 04/16/2012   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/16/2012   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   Osteoarthritis of knee 04/16/2012   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 female breast ([place]) 12/20/2010   Depression 09/14/2008   GERD 09/14/2008   Type 2 diabetes mellitus with diabetic neuropathy, without long-term current use of insulin ([place]) 05/20/2008   Essential hypertension 04/17/2007   Coronary atherosclerosis 04/17/2007   Hypothyroidism 04/03/2007   Past Surgical History:  Procedure Laterality Date   ABDOMINAL AORTOGRAM W/LOWER EXTREMITY Bilateral 04/29/2019   Procedure: ABDOMINAL AORTOGRAM W/LOWER EXTREMITY;  Surgeon: Marty Heck, MD;  Location: [facility];  Service: Cardiovascular;  Laterality: Bilateral;   ABDOMINAL AORTOGRAM W/LOWER EXTREMITY Left 07/01/2019   Procedure: ABDOMINAL AORTOGRAM W/ Left LOWER EXTREMITY [place];  Surgeon: Marty Heck, MD;  Location:  [facility];  Service: Cardiovascular;  Laterality: Left;   ANTERIOR AND POSTERIOR REPAIR N/A 04/05/2016   Procedure: ANTERIOR (CYSTOCELE) AND POSTERIOR REPAIR (RECTOCELE);  Surgeon: [name], MD;  Location: [facility];  Service: Gynecology;  Laterality: N/A;    AORTIC VALVE REPLACEMENT N/A 05/04/2020   Procedure: AORTIC VALVE REPLACEMENT (AVR) USING INSPIRIS 23MM RESILIA AORTIC VALVE;  Surgeon: Rexene Alberts, MD;  Location: [facility];  Service: Open Heart Surgery;  Laterality: N/A;   BREAST BIOPSY     BREAST LUMPECTOMY  1983   benign biopsy   BREAST LUMPECTOMY Right 12/29/2010   snbx, ER/PR +, Her2 -, 0/1 node pos.   CARPAL TUNNEL RELEASE Bilateral 9/12,8,12   CHOLECYSTECTOMY     COLONOSCOPY N/A 02/09/2013   Procedure: COLONOSCOPY;  Surgeon: [name], MD;  Location: [facility];  Service: Endoscopy;  Laterality: N/A;   COLONOSCOPY     CORONARY ANGIOPLASTY     CORONARY ARTERY BYPASS GRAFT N/A 05/04/2020   Procedure: CORONARY ARTERY BYPASS GRAFTING (CABG), ON PUMP, TIMES TWO, USING LEFT INTERNAL MAMMARY ARTERY AND RIGHT ENDOSCOPICALLY HARVESTED GREATER SAPHENOUS VEIN;  Surgeon: Rexene Alberts, MD;  Location: [facility];  Service: Open Heart Surgery;  Laterality: N/A;   DILATION AND CURETTAGE OF UTERUS     ESOPHAGOGASTRODUODENOSCOPY N/A 02/09/2013   Procedure: ESOPHAGOGASTRODUODENOSCOPY (EGD);  Surgeon: [name], MD;  Location: [name];  Service: Endoscopy;  Laterality: N/A;   FOOT SPURS     HYSTEROSCOPY WITH D & C N/A 09/11/2012   Procedure: DILATATION AND CURETTAGE ;  Surgeon: [name], MD;  Location: [facility];  Service: Gynecology;  Laterality: N/A;   KNEE SURGERY  2007   LAPAROSCOPIC ASSISTED VAGINAL HYSTERECTOMY N/A 04/05/2016   Procedure: LAPAROSCOPIC ASSISTED VAGINAL HYSTERECTOMY possible BSO;  Surgeon: [name], MD;  Location: [facility];  Service: Gynecology;  Laterality: N/A;   LUMBAR LAMINECTOMY/DECOMPRESSION MICRODISCECTOMY N/A 10/20/2014   Procedure: MICRO LUMBER DECOMPRESSION L3-4 L4-5;  Surgeon: [name], MD;  Location: [facility];  Service: Orthopedics;  Laterality: N/A;   LUMBAR LAMINECTOMY/DECOMPRESSION MICRODISCECTOMY Bilateral 10/13/2015   Procedure: MICRO LUMBAR DECOMPRESSION L5 - S1 AND REDO DECOMPRESSION L4 - L5  AND REMOVAL OF FACET CYST L4 - L5 2 LEVELS;  Surgeon: [name], MD;  Location: [facility];  Service: Orthopedics;  Laterality: Bilateral;   PERIPHERAL VASCULAR INTERVENTION Right 04/29/2019   Procedure: PERIPHERAL VASCULAR INTERVENTION;  Surgeon: Marty Heck, MD;  Location: [facility];  Service: Cardiovascular;  Laterality: Right;   PORT-A-CATH REMOVAL  04/17/2011   Procedure: REMOVAL PORT-A-CATH;  Surgeon: [name], MD;  Location: [facility];  Service: General;  Laterality: Left;   PORTACATH PLACEMENT  02/07/2011   Procedure: INSERTION PORT-A-CATH;  Surgeon: [name], MD;  Location: [facility];  Service: General;  Laterality: N/A;   RIGHT/LEFT HEART CATH AND CORONARY ANGIOGRAPHY N/A 03/17/2020   Procedure: RIGHT/LEFT HEART CATH AND CORONARY ANGIOGRAPHY;  Surgeon: Burnell Blanks, MD;  Location: [facility];  Service: Cardiovascular;  Laterality: N/A;   TEE WITHOUT CARDIOVERSION N/A 05/04/2020   Procedure: TRANSESOPHAGEAL ECHOCARDIOGRAM (TEE);  Surgeon: Rexene Alberts, MD;  Location: [facility];  Service: Open Heart Surgery;  Laterality: N/A;   UPPER GASTROINTESTINAL ENDOSCOPY     Family History  Problem Relation Age of Onset   Kidney disease Mother    Heart disease Mother    Throat cancer Father    Alcoholism Father    Heart attack Father  Lymphoma Brother 52   Heart attack Brother    Diabetes Brother    Hypertension Brother    Cancer Son    Colon cancer Neg Hx    Stroke Neg Hx    Esophageal cancer Neg Hx    Rectal cancer Neg Hx    Stomach cancer Neg Hx    Outpatient Medications Prior to Visit  Medication Sig Dispense Refill   Accu-Chek FastClix Lancets MISC USE TO CHECK BLOOD SUGARS 3 TIMES DAILY 306 each 3   acetaminophen (TYLENOL) 500 MG tablet Take 1,000 mg by mouth every 6 (six) hours as needed for moderate pain or headache.     aspirin EC 81 MG tablet Take 81 mg by mouth at bedtime.     blood glucose meter kit and  supplies Dispense based on patient and insurance preference. Use up to four times daily as directed. (FOR ICD-10 E10.9, E11.9). 1 each 0   Blood Glucose Monitoring Suppl DEVI by Does not apply route. Accu Check Guide     calcium carbonate (TUMS - DOSED IN MG ELEMENTAL CALCIUM) 500 MG chewable tablet Chew 2 tablets by mouth 3 (three) times daily as needed for indigestion or heartburn.     Cholecalciferol (VITAMIN D3) 25 MCG (1000 UT) CAPS Take 1,000 Units by mouth daily before lunch.     empagliflozin (JARDIANCE) 10 MG TABS tablet Take 1 tablet (10 mg total) by mouth daily before breakfast. 30 tablet 2   Evolocumab (REPATHA SURECLICK) 485 MG/ML SOAJ Inject 1 pen into the skin every 14 (fourteen) days. 2 mL 11   furosemide (LASIX) 20 MG tablet Take 1 tablet (20 mg total) by mouth daily. 90 tablet 3   glucose blood (ACCU-CHEK GUIDE) test strip TEST ONCE DAILY 100 each 5   glucose blood test strip 1 each by Other route as needed for other. Use as instructed  Please dispense Accu check Guide Test strips     Golimumab ([place] ARIA IV) Inject into the vein.     icosapent Ethyl (VASCEPA) 1 g capsule Take 2 capsules (2 g total) by mouth 2 (two) times daily. 120 capsule 11   losartan (COZAAR) 50 MG tablet Take 50 mg by mouth 2 (two) times daily.     Magnesium 250 MG TABS Take 250 mg by mouth at bedtime.     metFORMIN (GLUCOPHAGE) 500 MG tablet Take 1 tablet (500 mg total) by mouth daily with breakfast. 90 tablet 3   metoprolol tartrate (LOPRESSOR) 25 MG tablet TAKE 1 TABLET(25 MG) BY MOUTH TWICE DAILY 180 tablet 3   pantoprazole (PROTONIX) 40 MG tablet Take 1 tablet (40 mg total) by mouth daily. 90 tablet 3   PARoxetine (PAXIL) 20 MG tablet Take 1 tablet (20 mg total) by mouth in the morning. 90 tablet 3   sitaGLIPtin (JANUVIA) 100 MG tablet Take 1 tablet (100 mg total) by mouth daily. 90 tablet 3   traZODone (DESYREL) 50 MG tablet Take 0.5 tablets (25 mg total) by mouth at bedtime as needed for sleep. 30  tablet 6   valACYclovir (VALTREX) 1000 MG tablet Take 1 tablet (1,000 mg total) by mouth daily as needed (Fever Blisters). 180 tablet 0   levothyroxine (SYNTHROID) 88 MCG tablet TAKE 1 TABLET BY MOUTH  DAILY 90 tablet 3   No facility-administered medications prior to visit.   Allergies  Allergen Reactions   Codeine Nausea And Vomiting and Other (See Comments)    Severe stomach cramps   Antihistamines, Diphenhydramine-Type Other (See  Comments)    Causes hyperactivity   Gabapentin     Urinary incontinence   Statins Other (See Comments)    Joint pains   Sulfa Antibiotics     Unknown to pt   Erythromycin Hives and Rash    Due to dental work about 50 years ago. Was used in packing and resulted in rash/hives inside and outside of mouth.     Objective:   Today's Vitals   12/19/20 1424  BP: 138/68  Pulse: 86  Temp: 97.6 F (36.4 C)  TempSrc: Temporal  SpO2: 95%  Weight: 152 lb 12.8 oz (69.3 kg)  Height: '5\' 3"'  (1.6 m)   Body mass index is 27.07 kg/m.   General: Well developed, well nourished. No acute distress. Extremities: No edema noted tot he lower legs. There is no tenderness with palpation. Pain   indicated along the lateral margin of the shin bone bilaterally. Psych: Alert and oriented x3. Normal mood and affect.  Health Maintenance Due  Topic Date Due   Hepatitis C Screening  Never done   TETANUS/TDAP  Never done   Zoster Vaccines- Shingrix (2 of 2) 03/29/2011   FOOT EXAM  03/18/2019   Lab Results Lab Results  Component Value Date   TSH 13.43 (H) 11/02/2020     Assessment & Plan:   1. Type 2 diabetes mellitus with diabetic neuropathy, without long-term current use of insulin (HCC) Although I had stopped both the pioglitazone (due to cardiology concerns) and glipizide (due to concerns of risk for hypoglycemia), her glucose control has been worse despite adding Jardiance. I willa dd the glipizide back. I asked [name] to be checking her fasting blood sugar  daily. She should report to me if her FBS is < 80. I will recheck her in 3 weeks.  - glipiZIDE (GLUCOTROL XL) 10 MG 24 hr tablet; Take 1 tablet (10 mg total) by mouth daily with breakfast.  Dispense: 90 tablet; Refill: 3  2. Diabetic peripheral neuropathy (HCC) It does sound reasonable that her shin pain is likely due to neuropathy, potentially exacerbated by her recent higher blood sugars. She has had a past reaction to gabapentin, so I will try adding a low dose of amitriptyline and see if this reduces her pain.  - amitriptyline (ELAVIL) 10 MG tablet; Take 1 tablet (10 mg total) by mouth at bedtime.  Dispense: 30 tablet; Refill: 2  3. Hypothyroidism, unspecified type [name] TSH has been increasing. I will increase her synthroid dose to 100 mcg and repeat her TSH in 3 months.  - levothyroxine (SYNTHROID) 100 MCG tablet; Take 1 tablet (100 mcg total) by mouth daily.  Dispense: 30 tablet; Refill: 3  [name], MD

## 2020-12-27 ENCOUNTER — Telehealth: Payer: Self-pay

## 2020-12-27 NOTE — Progress Notes (Signed)
Chronic Care Management Pharmacy Assistant   Name: [name]  MRN: [id number] DOB: 1940/04/06  Chart Review for clinical pharmacist on 12/29/2020 at 1:30 pm.  Conditions to be addressed/monitored: HTN, HLD, DMII, CKD Stage 4, Anxiety, Depression, GERD, Osteoarthritis, and PAD, Coronary Atherosclerosis.Hypothyroidism, Inflammatory polyarthropathy,Degeneration of lumbar intervertebral disc,Iron deficiency anemia, Claudication, Breast cancer of right breast, Chronic headaches, Insomnia.  Primary concerns for visit include: Patient states she is having  leg discomfort, and some blockage that her PCP believes  it is coming from her diabetes.   Recent office visits:  12/19/2020 Dr. Gena Fray MD (PCP) Restart Glipizide 10 mg daily,start amitriptyline 10 mg daily, increase levothyroxine 100 MCG daily,  Follow up in three weeks. 11/02/2020 Dr. Gena Fray MD (PCP) Stop Pioglitazone, stop Glipizide, start Jardiance 10 mg daily, start Trazodone 25 mg PRN, AMB Referral to [facility], AMB Referral to [facility],  Follow up 3 months 10/19/2020 Dr. Gena Fray MD (PCP) Start Amoxicillin- Pot Clavulanate 500-125 mg 2 times daily, Start Phenazopyridine 100 mg 3 times daily PRN 09/06/2020 Leroy Kennedy LPN (PCP Office) Medicare Wellness Completed 07/26/2020 Dr.Kremer MD ( PCP) Change Losartan  to 25 mg 3 tablets daily, Ambulatory referral to [place] in about 1 month  07/20/2020 Nolon Lennert Do (PCP Office) No medication Changes noted, follow up 3 months.  Recent consult visits:  11/18/2020 Marcelle Overlie RPH-CPP (Cardiology) restart  Repatha 140 MG/ML, start Vascepa 1 g 2 times daily,  Follow up 2 months 10/05/2020 Dr. Johnsie Cancel MD (Cardiology)No medication changes noted, Follow up in 6 months 09/28/2020 Dr. Donzetta Matters MD (Vascular Surgery) No medication changes noted, Follow up in 6 months 09/12/2020 Ewell Poe RN (Cardiology) Ambulatory referral to Vascular  Surgery 09/02/2020 Dr. Johnsie Cancel MD (Cardiology) restart Lasix 20 mg by mouth daily, and to follow up in 4 weeks 08/24/2020 Dr. Johnsie Cancel MD (Cardiology) Patient reports she discontinued Repatha due to cold like symptoms,  F/U 6 months  07/18/2020 Dr. Irish Lack MD (Cardiology) Change Lasix to as needed 07/15/2020 Dr. Roxy Manns MD (Cardiology) No medication changes noted.  Hospital visits:  None in previous 6 months  Have you seen any other providers since your last visit?   Patient denies see any other providers.  Any changes in your medications or health?   Patient denies any changes at this time.  Any side effects from any medications?   Patient denies any side effects.  Do you have an symptoms or problems not managed by your medications?   Patient denies any problems at this time.  Any concerns about your health right now?   Patient states she is having  leg discomfort, and some blockage that her PCP believes  it is coming from her diabetes.  Has your provider asked that you check blood pressure, blood sugar, or follow special diet at home?   Patient states she does not check her blood pressure at home.  Patient reports checking her blood sugar daily fasting.  Do you get any type of exercise on a regular basis?   Patient states she has not been able to exercise due to her legs.  Can you think of a goal you would like to reach for your health?   Patient will inform the clinical pharmacist.  Do you have any problems getting your medications?   Patient denies any problems getting her medications  Is there anything that you would like to discuss during the appointment?  Patient states she is having  leg discomfort, and some blockage that  her PCP believes  it is coming from her diabetes.  Please bring medications and supplements to appointment  Patient is aware to bring her medication and supplements to her appointment.   Patient is aware she can return my call if she has any  questions/concerns.  Medications: Outpatient Encounter Medications as of 12/27/2020  Medication Sig Note   Accu-Chek FastClix Lancets MISC USE TO CHECK BLOOD SUGARS 3 TIMES DAILY    acetaminophen (TYLENOL) 500 MG tablet Take 1,000 mg by mouth every 6 (six) hours as needed for moderate pain or headache.    amitriptyline (ELAVIL) 10 MG tablet Take 1 tablet (10 mg total) by mouth at bedtime.    aspirin EC 81 MG tablet Take 81 mg by mouth at bedtime.    blood glucose meter kit and supplies Dispense based on patient and insurance preference. Use up to four times daily as directed. (FOR ICD-10 E10.9, E11.9).    Blood Glucose Monitoring Suppl DEVI by Does not apply route. Accu Check Guide    calcium carbonate (TUMS - DOSED IN MG ELEMENTAL CALCIUM) 500 MG chewable tablet Chew 2 tablets by mouth 3 (three) times daily as needed for indigestion or heartburn.    Cholecalciferol (VITAMIN D3) 25 MCG (1000 UT) CAPS Take 1,000 Units by mouth daily before lunch.    empagliflozin (JARDIANCE) 10 MG TABS tablet Take 1 tablet (10 mg total) by mouth daily before breakfast.    Evolocumab (REPATHA SURECLICK) 503 MG/ML SOAJ Inject 1 pen into the skin every 14 (fourteen) days.    furosemide (LASIX) 20 MG tablet Take 1 tablet (20 mg total) by mouth daily.    glipiZIDE (GLUCOTROL XL) 10 MG 24 hr tablet Take 1 tablet (10 mg total) by mouth daily with breakfast.    glucose blood (ACCU-CHEK GUIDE) test strip TEST ONCE DAILY    glucose blood test strip 1 each by Other route as needed for other. Use as instructed  Please dispense Accu check Guide Test strips    Golimumab ([place] ARIA IV) Inject into the vein.    icosapent Ethyl (VASCEPA) 1 g capsule Take 2 capsules (2 g total) by mouth 2 (two) times daily.    levothyroxine (SYNTHROID) 100 MCG tablet Take 1 tablet (100 mcg total) by mouth daily.    losartan (COZAAR) 50 MG tablet Take 50 mg by mouth 2 (two) times daily.    Magnesium 250 MG TABS Take 250 mg by mouth at  bedtime.    metFORMIN (GLUCOPHAGE) 500 MG tablet Take 1 tablet (500 mg total) by mouth daily with breakfast.    metoprolol tartrate (LOPRESSOR) 25 MG tablet TAKE 1 TABLET(25 MG) BY MOUTH TWICE DAILY    pantoprazole (PROTONIX) 40 MG tablet Take 1 tablet (40 mg total) by mouth daily.    PARoxetine (PAXIL) 20 MG tablet Take 1 tablet (20 mg total) by mouth in the morning.    sitaGLIPtin (JANUVIA) 100 MG tablet Take 1 tablet (100 mg total) by mouth daily.    traZODone (DESYREL) 50 MG tablet Take 0.5 tablets (25 mg total) by mouth at bedtime as needed for sleep.    valACYclovir (VALTREX) 1000 MG tablet Take 1 tablet (1,000 mg total) by mouth daily as needed (Fever Blisters). 07/26/2020: prn   No facility-administered encounter medications on file as of 12/27/2020.    Care Gaps: Hepatitis C Screening Tetanus Vaccine Shingrix Vaccine  Foot Exam (Last Completed 03/17/2018   Star Rating Drugs: Glipizide 10 mg last filled 12/19/2020 90 day supply at  [facility]. Jardiance 10 mg last filled 12/07/2020 30 day supply at [place]. Losartan 50 mg last filled 09/15/2020 90 day supply at [facility]. Januvia 100 mg last filled 10/30/2020 90 day supply at [facility]. Metformin 500 mg last filled 10/30/2020 90 day supply at [facility].  Medication Fill Gaps: Furosemide 20 MG  last filled 09/05/2020 90 day supply Metoprolol tartrate 25 MG tablet last filled 09/20/2020 90 day supply.   [place] Pharmacist Assistant [phone]

## 2020-12-29 ENCOUNTER — Ambulatory Visit: Payer: Medicare Other

## 2020-12-29 ENCOUNTER — Other Ambulatory Visit: Payer: Self-pay

## 2020-12-29 DIAGNOSIS — I739 Peripheral vascular disease, unspecified: Secondary | ICD-10-CM

## 2020-12-29 DIAGNOSIS — E1140 Type 2 diabetes mellitus with diabetic neuropathy, unspecified: Secondary | ICD-10-CM

## 2020-12-29 DIAGNOSIS — K219 Gastro-esophageal reflux disease without esophagitis: Secondary | ICD-10-CM

## 2020-12-29 DIAGNOSIS — E1122 Type 2 diabetes mellitus with diabetic chronic kidney disease: Secondary | ICD-10-CM

## 2020-12-29 DIAGNOSIS — I10 Essential (primary) hypertension: Secondary | ICD-10-CM

## 2020-12-29 DIAGNOSIS — F5101 Primary insomnia: Secondary | ICD-10-CM

## 2020-12-29 DIAGNOSIS — N184 Chronic kidney disease, stage 4 (severe): Secondary | ICD-10-CM

## 2020-12-29 DIAGNOSIS — F3342 Major depressive disorder, recurrent, in full remission: Secondary | ICD-10-CM

## 2020-12-29 DIAGNOSIS — E785 Hyperlipidemia, unspecified: Secondary | ICD-10-CM

## 2020-12-29 NOTE — Progress Notes (Signed)
Chronic Care Management Pharmacy Note  12/30/2020 Name:  [name] MRN:  [id number] DOB:  May 04, 1940  Summary: Patient presents for initial CCM consult. She has multiple medication concerns today. Her primary concerns include grogginess in the morning and worsening dry mouth.   -Tolerating lipid regimen well, reports it is affordable. -Metformin contraindicated in eGFR <30 due to risk of lactic acidosis  -Januvia dose likely too high for patient renal function,  Recommendations/Changes made from today's visit: -STOP Metformin  -Recommend stopping Januvia, starting Ozempic 0.25 mg weekly for 4 weeks.   Plan: CPP follow-up 1 month.    Subjective: [name] is an 80 y.o. year old female who is a primary patient of Rudd, Lillette Boxer, MD.  The CCM team was consulted for assistance with disease management and care coordination needs.    Engaged with patient by telephone for initial visit in response to provider referral for pharmacy case management and/or care coordination services.   Consent to Services:  The patient was given the following information about Chronic Care Management services today, agreed to services, and gave verbal consent: 1. CCM service includes personalized support from designated clinical staff supervised by the primary care provider, including individualized plan of care and coordination with other care providers 2. 24/7 contact phone numbers for assistance for urgent and routine care needs. 3. Service will only be billed when office clinical staff spend 20 minutes or more in a month to coordinate care. 4. Only one practitioner may furnish and bill the service in a calendar month. 5.The patient may stop CCM services at any time (effective at the end of the month) by phone call to the office staff. 6. The patient will be responsible for cost sharing (co-pay) of up to 20% of the service fee (after annual deductible is met). Patient agreed to services and consent  obtained.  Patient Care Team: [name], MD as PCP - General (Family Medicine) Josue Hector, MD as Consulting Physician (Cardiology) Waynetta Sandy, MD as Consulting Physician (Vascular Surgery) Germaine Pomfret, [facility] as Pharmacist (Pharmacist) Gavin Pound, MD as Consulting Physician (Rheumatology)  Recent office visits: 12/19/2020 Dr. Gena Fray MD (PCP) Restart Glipizide 10 mg daily,start amitriptyline 10 mg daily, increase levothyroxine 100 MCG daily,  Follow up in three weeks. 11/02/2020 Dr. Gena Fray MD (PCP) Stop Pioglitazone, stop Glipizide, start Jardiance 10 mg daily, start Trazodone 25 mg PRN, AMB Referral to [facility], AMB Referral to [facility],  Follow up 3 months 10/19/2020 Dr. Gena Fray MD (PCP) Start Amoxicillin- Pot Clavulanate 500-125 mg 2 times daily, Start Phenazopyridine 100 mg 3 times daily PRN 09/06/2020 Leroy Kennedy LPN (PCP Office) Medicare Wellness Completed 07/26/2020 Dr.Kremer MD ( PCP) Change Losartan  to 25 mg 3 tablets daily, Ambulatory referral to [facility] in about 1 month  07/20/2020 Nolon Lennert Do (PCP Office) No medication Changes noted, follow up 3 months.  Recent consult visits: 11/18/2020 Marcelle Overlie RPH-CPP (Cardiology) restart  Repatha 140 MG/ML, start Vascepa 1 g 2 times daily,  Follow up 2 months 10/05/2020 Dr. Johnsie Cancel MD (Cardiology)No medication changes noted, Follow up in 6 months 09/28/2020 Dr. Donzetta Matters MD (Vascular Surgery) No medication changes noted, Follow up in 6 months 09/12/2020 Ewell Poe RN (Cardiology) Ambulatory referral to Vascular Surgery 09/02/2020 Dr. Johnsie Cancel MD (Cardiology) restart Lasix 20 mg by mouth daily, and to follow up in 4 weeks 08/24/2020 Dr. Johnsie Cancel MD (Cardiology) Patient reports she discontinued Repatha due to cold like symptoms,  F/U  6 months  07/18/2020 Dr. Irish Lack MD (Cardiology) Change Lasix to as needed 07/15/2020 Dr. Roxy Manns MD (Cardiology) No medication  changes noted.  Hospital visits: None in previous 6 months   Objective:  Lab Results  Component Value Date   CREATININE 1.92 (H) 11/02/2020   BUN 30 (H) 11/02/2020   GFR 24.76 (L) 11/02/2020   GFRNONAA 32 (L) 05/09/2020   GFRAA 32 (L) 03/11/2020   NA 139 11/02/2020   K 5.3 No hemolysis seen (H) 11/02/2020   CALCIUM 9.8 11/02/2020   CO2 26 11/02/2020   GLUCOSE 139 (H) 11/02/2020    Lab Results  Component Value Date/Time   HGBA1C 7.2 (H) 11/02/2020 09:40 AM   HGBA1C 7.7 (H) 07/20/2020 09:08 AM   HGBA1C 10.0 05/22/2018 01:59 PM   HGBA1C 8.4 03/17/2018 11:15 AM   GFR 24.76 (L) 11/02/2020 09:40 AM   GFR 31.26 (L) 11/20/2019 10:02 AM   MICROALBUR 33.6 (H) 11/02/2020 09:40 AM   MICROALBUR 25.9 (H) 11/20/2019 10:02 AM   MICROALBUR 150 06/03/2017 08:28 AM    Last diabetic Eye exam:  Lab Results  Component Value Date/Time   HMDIABEYEEXA No Retinopathy 09/23/2020 12:00 AM    Last diabetic Foot exam: No results found for: HMDIABFOOTEX   Lab Results  Component Value Date   CHOL 258 (H) 11/02/2020   HDL 42.70 11/02/2020   LDLCALC 83 05/13/2008   LDLDIRECT 172.0 11/02/2020   TRIG 214.0 (H) 11/02/2020   CHOLHDL 6 11/02/2020    Hepatic Function Latest Ref Rng & Units 07/20/2020 05/02/2020 04/29/2019  Total Protein 6.0 - 8.3 g/dL 7.0 7.4 5.7(L)  Albumin 3.5 - 5.2 g/dL 4.0 3.6 2.9(L)  AST 0 - 37 U/L _0 ALT 0 - 35 U/L _1 Alk Phosphatase 39 - 117 U/L 63 61 58  Total Bilirubin 0.2 - 1.2 mg/dL 0.5 0.8 0.5  Bilirubin, Direct 0.0 - 0.3 mg/dL 0.1 - -    Lab Results  Component Value Date/Time   TSH 13.43 (H) 11/02/2020 09:40 AM   TSH 10.600 (H) 08/24/2020 10:05 AM   FREET4 1.14 06/23/2019 08:06 AM   FREET4 0.97 05/06/2019 08:59 AM    CBC Latest Ref Rng & Units 11/02/2020 08/24/2020 05/09/2020  WBC 4.0 - 10.5 K/uL 6.6 5.9 12.5(H)  Hemoglobin 12.0 - 15.0 g/dL 12.8 12.7 10.8(L)  Hematocrit 36.0 - 46.0 % 38.6 38.8 33.8(L)  Platelets 150.0 - 400.0 K/uL 224.0 226 221     Lab Results  Component Value Date/Time   VD25OH 49.46 11/02/2020 09:40 AM   VD25OH 55.05 11/03/2018 09:16 AM    Clinical ASCVD: No  The 10-year ASCVD risk score (Arnett DK, et al., 2019) is: 51.2%   Values used to calculate the score:     Age: 78 years     Sex: Female     Is Non-Hispanic African American: No     Diabetic: Yes     Tobacco smoker: No     Systolic Blood Pressure: 160 mmHg     Is BP treated: Yes     HDL Cholesterol: 42.7 mg/dL     Total Cholesterol: 258 mg/dL    Depression screen [facility] 2/9 09/06/2020 07/26/2020 08/19/2019  Decreased Interest 0 0 0  Down, Depressed, Hopeless 0 0 0  PHQ - 2 Score 0 0 0  Some recent data might be hidden    Social History   Tobacco Use  Smoking Status Former   Packs/day: 2.00   Years: 40.00  Pack years: 80.00   Types: Cigarettes   Quit date: 12/19/1997   Years since quitting: 23.0  Smokeless Tobacco Never   BP Readings from Last 3 Encounters:  12/19/20 138/68  11/02/20 118/66  10/19/20 (!) 150/78   Pulse Readings from Last 3 Encounters:  12/19/20 86  11/02/20 76  10/19/20 80   Wt Readings from Last 3 Encounters:  12/19/20 152 lb 12.8 oz (69.3 kg)  11/02/20 152 lb 12.8 oz (69.3 kg)  10/19/20 155 lb 9.6 oz (70.6 kg)   BMI Readings from Last 3 Encounters:  12/19/20 27.07 kg/m  11/02/20 27.07 kg/m  10/19/20 27.56 kg/m    Assessment/Interventions: Review of patient past medical history, allergies, medications, health status, including review of consultants reports, laboratory and other test data, was performed as part of comprehensive evaluation and provision of chronic care management services.   SDOH:  (Social Determinants of Health) assessments and interventions performed: Yes SDOH Interventions    Flowsheet Row Most Recent Value  SDOH Interventions   Financial Strain Interventions Intervention Not Indicated      SDOH Screenings   Alcohol Screen: Low Risk    Last Alcohol Screening Score (AUDIT): 4   Depression (PHQ2-9): Low Risk    PHQ-2 Score: 0  Financial Resource Strain: Low Risk    Difficulty of Paying Living Expenses: Not hard at all  Food Insecurity: No Food Insecurity   Worried About Charity fundraiser in the Last Year: Never true   Ran Out of Food in the Last Year: Never true  Housing: Low Risk    Last Housing Risk Score: 0  Physical Activity: Insufficiently Active   Days of Exercise per Week: 2 days   Minutes of Exercise per Session: 20 min  Social Connections: Moderately Integrated   Frequency of Communication with Friends and Family: Three times a week   Frequency of Social Gatherings with Friends and Family: Twice a week   Attends Religious Services: More than 4 times per year   Active Member of [employer] or Organizations: No   Attends Archivist Meetings: Never   Marital Status: Married  Stress: No Stress Concern Present   Feeling of Stress : Only a little  Tobacco Use: Medium Risk   Smoking Tobacco Use: Former   Smokeless Tobacco Use: Never   Passive Exposure: Not on file  Transportation Needs: No Transportation Needs   Lack of Transportation (Medical): No   Lack of Transportation (Non-Medical): No    CCM Care Plan  Allergies  Allergen Reactions   Codeine Nausea And Vomiting and Other (See Comments)    Severe stomach cramps   Antihistamines, Diphenhydramine-Type Other (See Comments)    Causes hyperactivity   Gabapentin     Urinary incontinence   Statins Other (See Comments)    Joint pains   Sulfa Antibiotics     Unknown to pt   Erythromycin Hives and Rash    Due to dental work about 50 years ago. Was used in packing and resulted in rash/hives inside and outside of mouth.   Trulicity [Dulaglutide] Itching    Medications Reviewed Today     Reviewed by Germaine Pomfret, [facility] (Pharmacist) on 12/30/20 at 1121  Med List Status: <None>   Medication Order Taking? Sig Documenting Provider Last Dose Status Informant  Accu-Chek FastClix Lancets  MISC [id number]  USE TO CHECK BLOOD SUGARS 3 TIMES DAILY Ronnald Nian, DO  Active Self  acetaminophen (TYLENOL) 650 MG CR tablet [id number] Yes Take 650  mg by mouth every 8 (eight) hours as needed for pain. [provider] Taking Active   amitriptyline (ELAVIL) 10 MG tablet [id number] Yes Take 1 tablet (10 mg total) by mouth at bedtime. [name], MD Taking Active   aspirin EC 81 MG tablet [id number] Yes Take 81 mg by mouth at bedtime. [provider] Taking Active Self  blood glucose meter kit and supplies [id number]  Dispense based on patient and insurance preference. Use up to four times daily as directed. (FOR ICD-10 E10.9, E11.9). [name], MD  Active   Blood Glucose Monitoring Suppl DEVI [id number]  by Does not apply route. Accu Check Guide [provider]  Active   Cholecalciferol (VITAMIN D3) 25 MCG (1000 UT) CAPS [id number] Yes Take 2,000 Units by mouth daily before lunch. [provider] Taking Active Self  empagliflozin (JARDIANCE) 10 MG TABS tablet [id number] Yes Take 1 tablet (10 mg total) by mouth daily before breakfast. [name], MD Taking Active   Evolocumab [facility] SURECLICK) 956 MG/ML Darden Palmer [id number] Yes Inject 1 pen into the skin every 14 (fourteen) days. Josue Hector, MD Taking Active   furosemide (LASIX) 20 MG tablet [id number] Yes Take 1 tablet (20 mg total) by mouth daily. Josue Hector, MD Taking Active            Med Note Daron Offer A   Thu Dec 29, 2020  2:03 PM) Takes PRN  glipiZIDE (GLUCOTROL XL) 10 MG 24 hr tablet [id number] Yes Take 1 tablet (10 mg total) by mouth daily with breakfast. [name], MD Taking Active   glucose blood (ACCU-CHEK GUIDE) test strip [id number]  TEST ONCE DAILY [name], MD  Active   glucose blood test strip [id number]  1 each by Other route as needed for other. Use as instructed  Please dispense Accu check Guide Test strips [provider]  Active   Golimumab  ([place] IV) [id number] Yes Inject into the vein every 2 (two) months. [provider] Taking Active   icosapent Ethyl (VASCEPA) 1 g capsule [id number] Yes Take 2 capsules (2 g total) by mouth 2 (two) times daily. Josue Hector, MD Taking Active   levothyroxine (SYNTHROID) 100 MCG tablet [id number] Yes Take 1 tablet (100 mcg total) by mouth daily. [name], MD Taking Active   losartan (COZAAR) 50 MG tablet [id number] Yes Take 50 mg by mouth daily. [provider] Taking Active Self  Magnesium 250 MG TABS [id number] Yes Take 250 mg by mouth at bedtime. [provider] Taking Active Self  metoprolol tartrate (LOPRESSOR) 25 MG tablet [id number] Yes TAKE 1 TABLET(25 MG) BY MOUTH TWICE DAILY Josue Hector, MD Taking Active   pantoprazole (PROTONIX) 40 MG tablet [id number] Yes Take 1 tablet (40 mg total) by mouth daily. Ronnald Nian, DO Taking Active Self  PARoxetine (PAXIL) 20 MG tablet [id number] Yes Take 1 tablet (20 mg total) by mouth in the morning. Ronnald Nian, DO Taking Active Self  sitaGLIPtin (JANUVIA) 100 MG tablet [id number] Yes Take 1 tablet (100 mg total) by mouth daily. Ronnald Nian, DO Taking Active   traZODone (DESYREL) 50 MG tablet [id number] Yes Take 0.5 tablets (25 mg total) by mouth at bedtime as needed for sleep. [name], MD Taking Active   valACYclovir (VALTREX) 1000 MG tablet [id number] Yes Take 1 tablet (1,000 mg total) by mouth daily as needed (Fever Blisters). 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   Primary insomnia 11/02/2020   Irritation of eyelid 07/26/2020   S/P aortic valve replacement with bioprosthetic valve 05/04/2020   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   PAD (peripheral artery disease)  (HCC)    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[place]) 04/07/2019   Low back pain 02/03/2019   Scoliosis deformity of spine 02/03/2019   Osteoarthritis of hip 11/17/2018   Claudication ([place])    Chronic headaches 11/12/2017   Iron deficiency anemia 11/12/2017   Degeneration of lumbar intervertebral disc 06/13/2017   Hyperkalemia 06/03/2017   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[place])    Mass of left adrenal gland ([place]) 04/19/2017   Spinal stenosis of lumbar region 10/20/2014   Hyperlipidemia 03/16/2013   Dense breasts 12/22/2012   Anxiety state 04/16/2012   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/16/2012   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   Osteoarthritis of knee 04/16/2012   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 female breast ([place]) 12/20/2010   Depression 09/14/2008   GERD 09/14/2008   Type 2 diabetes mellitus with diabetic neuropathy, without long-term current use of insulin ([place]) 05/20/2008   Essential hypertension 04/17/2007   Coronary atherosclerosis 04/17/2007   Hypothyroidism 04/03/2007    Immunization History  Administered Date(s) Administered   Fluad Quad(high Dose 65+) 11/20/2018   Influenza Split 12/07/2014   Influenza, High Dose Seasonal PF 11/26/2016   Influenza,inj,quad, With Preservative 02/22/2017, 11/20/2018   Influenza-Unspecified 10/31/2009, 12/01/2010, 12/03/2011, 11/05/2012, 11/05/2013, 10/25/2014, 11/27/2017, 10/07/2019, 11/28/2020   PFIZER(Purple Top)SARS-COV-2 Vaccination 02/12/2019, 03/05/2019, 03/15/2020   Pfizer Covid-19 Vaccine Bivalent Booster 79yr & up 11/28/2020   Pneumococcal Conjugate-13 07/30/2014   Pneumococcal Polysaccharide-23 11/19/2007, 11/15/2008   Pneumococcal-Unspecified 01/23/2010   Zoster Recombinat (Shingrix) 02/01/2011    Conditions to be addressed/monitored:  Hypertension, Hyperlipidemia, Diabetes, Coronary Artery Disease, GERD, Chronic Kidney Disease, Hypothyroidism, Depression,  Osteoarthritis, and Peripheral Artery Disease  Care Plan : General Pharmacy (Adult)  Updates made by FGermaine Pomfret RPH since 12/30/2020 12:00 AM     Problem: Hypertension, Hyperlipidemia, Diabetes, Coronary Artery Disease, GERD, Chronic Kidney Disease, Hypothyroidism, Depression, Osteoarthritis, and Peripheral Artery Disease   Priority: High     Long-Range Goal: Patient-Specific Goal   Start Date: 12/30/2020  Expected End Date: 12/30/2021  This Visit's Progress: On track  Priority: High  Note:   Current Barriers:  Unable to maintain control of diabetes  Pharmacist Clinical Goal(s):  Patient will achieve control of diabetes as evidenced by A1c less than 8% through collaboration with PharmD and provider.   Interventions: 1:1 collaboration with RHaydee Salter MD regarding development and update of comprehensive plan of care as evidenced by provider attestation and co-signature Inter-disciplinary care team collaboration (see longitudinal plan of care) Comprehensive medication review performed; medication list updated in electronic medical record  Hypertension (BP goal <140/90) -Controlled -Current treatment: Furosemide 20 mg daily  Losartan 50 mg daily  Metoprolol tartrate 25 mg twice daily -Medications previously tried: NA  -Current home readings: Does not monitor regularly at home  -Denies hypotensive/hypertensive symptoms -Counseled to monitor BP at home weekly, document, and provide log at future appointments -Recommended to continue current medication  Hyperlipidemia: (LDL goal < 55 per cardiology) -Uncontrolled -History  of PAD, 80 year smoking history.  -Current treatment: Repatha 140 mg every 14 days  Vascepa 1g 2 caps twice daily -Current treatment: Aspirin 81 mg daily  -Medications previously tried: Eztimibe, Aotrvastatin, Rosuvastatin, Livalo, colesevelam, cholestyramine  -Tolerating lipid regimen well, reports it is affordable. Counseled patient on  importance of limiting fat in diet in addition to medication changes. -Recommended to continue current medication  Diabetes (A1c goal <8%) -Not ideally controlled -Current medications: Jardiance 10 mg daily  Glipizide XL 10 mg daily  Metformin 500 mg daily  Januvia 100 mg daily  -Medications previously tried: Trulicity (itching), -Current home glucose readings fasting glucose: 160s -Denies hypoglycemic/hyperglycemic symptoms -Metformin contraindicated in eGFR <30 due to risk of lactic acidosis  -Januvia dose likely too high for patient renal function, but patient concerned about rapid changes and worsening blood sugars as a result. Could consider switching Januvia for Ozempic, but patient does report she experienced itching with Trulicity 1.5 mg dose.  -STOP Metformin  -Recommend stopping Januvia, starting Ozempic 0.25 mg weekly for 4 weeks.   Depression (Goal: maintain symptom remission) -Not ideally controlled -Current treatment: Amitriptyline 10 mg nightly  Paroxetine 20 mg daily  -Medications previously tried/failed: NA -Symptoms historically well controlled with paroxetine. Patient reports that she "cried all the time" when she stopped paroxetine, so is hesitant to stop paroxetine completely.  -Worsening dry mouth since starting Jardiance + amitriptyline. Would assume symptoms are coming from amitriptyline + paroxetine combination. Will consider decreasing paroxetine at next visit.   -PHQ9: 0 -Recommended to continue current medication  Insomnia (Goal: Improve sleep) -Controlled -Current treatment  Amitriptyline 10 mg nightly Trazodone 50 mg 1/2 tablet nightly as needed  -Medications previously tried: NA -Patient reports improved sleep since starting amitriptyline, but does report grogginess throughout the morning.   -Rarely takes trazodone due to not knowing best way to take. Counseled to take if she cannot achieve good quality sleep for 2 nights in a row.  -Most nights  sleeps 10p-8a. Wakes up 2-3 times nightly, but is able to fall back asleep immediately after.  -Counseled on proper sleep hygiene  -Recommended to continue current medication  Hypothyroidism (Goal: Maintain thyroid function) -Controlled -Current treatment  Levothyroxine 100 mcg daily  -Medications previously tried: NA  -Recommended to continue current medication  GERD (Goal: Prevent heartburn symptoms) -Controlled -Current treatment  Pantoprazole 40 mg daily  -Medications previously tried: NA -Patient reports rebound symptoms if she skips her pantoprazole.   -Recommended to continue current medication  Chronic Pain (Goal: Minimize pain) -Controlled -Current treatment  Acetaminophen CR 650 mg every 8 hours as needed Amitriptyline 10 mg nightly  Simponi injections every 2 months  -Medications previously tried: NA -Counseled to avoid NSAID use.  -Counseled to limit acetaminophen to 3000 mg daily.   -Recommended to continue current medication  Chronic Kidney Disease Stage 4  -All medications assessed for renal dosing and appropriateness in chronic kidney disease. -Metformin contraindicated in eGFR <30 due to risk of lactic acidosis  -Januvia max recommended dose 25 mg daily  -Recommended to continue current medication  Patient Goals/Self-Care Activities Patient will:  - check glucose daily before breakfast, document, and provide at future appointments check blood pressure weekly, document, and provide at future appointments  Follow Up Plan: Telephone follow up appointment with care management team member scheduled for:  03/02/2021 at 3:00 PM      Medication Assistance: None required.  Patient affirms current coverage meets needs.  Compliance/Adherence/Medication fill history: Care Gaps: Hepatitis C Screening  Tetanus Vaccine Shingrix Vaccine  Foot Exam (Last Completed 03/17/2018  Star-Rating Drugs: Glipizide 10 mg last filled 12/19/2020 90 day supply at [facility]. Jardiance 10 mg last filled 12/07/2020 30 day supply at [facility]. Losartan 50 mg last filled 09/15/2020 90 day supply at [facility]. Patient reports she filled prescription in November.  Januvia 100 mg last filled 10/30/2020 90 day supply at [facility]. Metformin 500 mg last filled 10/30/2020 90 day supply at [facility].  Patient's preferred pharmacy is:  [employer] ([facility] - [place], [place] [facility] [address] [place] [address] [place] [address] Phone: [phone] Fax: [phone]  [id number] - [address] [place] [address] AT [facility] [address] [place] [address] Phone: [phone] Fax: [phone]  Uses pill box? Yes Pt endorses 100% compliance  We discussed: Current pharmacy is preferred with insurance plan and patient is satisfied with pharmacy services Patient decided to: Continue current medication management strategy  Care Plan and Follow Up Patient Decision:  Patient agrees to Care Plan and Follow-up.  Plan: Telephone follow up appointment with care management team member scheduled for:  03/02/2021 at 3:00 PM  Junius Argyle, PharmD, Para [place], CPP Clinical Pharmacist Practitioner  [facility]  [phone]

## 2020-12-29 NOTE — Patient Instructions (Addendum)
Visit Information It was great speaking with you today!  Please let me know if you have any questions about our visit.   Goals Addressed             This Visit's Progress    Monitor and Manage My Blood Sugar-Diabetes Type 2       Timeframe:  Long-Range Goal Priority:  High Start Date: 12/30/2020                            Expected End Date: 12/30/2021                      Follow Up within 90 days   -check blood sugar daily before breakfast - check blood sugar if I feel it is too high or too low - take the blood sugar log to all doctor visits    Why is this important?   Checking your blood sugar at home helps to keep it from getting very high or very low.  Writing the results in a diary or log helps the doctor know how to care for you.  Your blood sugar log should have the time, date and the results.  Also, write down the amount of insulin or other medicine that you take.  Other information, like what you ate, exercise done and how you were feeling, will also be helpful.     Notes:         Patient Care Plan: General Pharmacy (Adult)     Problem Identified: Hypertension, Hyperlipidemia, Diabetes, Coronary Artery Disease, GERD, Chronic Kidney Disease, Hypothyroidism, Depression, Osteoarthritis, and Peripheral Artery Disease   Priority: High     Long-Range Goal: Patient-Specific Goal   Start Date: 12/30/2020  Expected End Date: 12/30/2021  This Visit's Progress: On track  Priority: High  Note:   Current Barriers:  Unable to maintain control of diabetes  Pharmacist Clinical Goal(s):  Patient will achieve control of diabetes as evidenced by A1c less than 8% through collaboration with PharmD and provider.   Interventions: 1:1 collaboration with [name], MD regarding development and update of comprehensive plan of care as evidenced by provider attestation and co-signature Inter-disciplinary care team collaboration (see longitudinal plan of care) Comprehensive  medication review performed; medication list updated in electronic medical record  Hypertension (BP goal <140/90) -Controlled -Current treatment: Furosemide 20 mg daily  Losartan 50 mg daily  Metoprolol tartrate 25 mg twice daily -Medications previously tried: NA  -Current home readings: Does not monitor regularly at home  -Denies hypotensive/hypertensive symptoms -Counseled to monitor BP at home weekly, document, and provide log at future appointments -Recommended to continue current medication  Hyperlipidemia: (LDL goal < 55 per cardiology) -Uncontrolled -History of PAD, 80 year smoking history.  -Current treatment: Repatha 140 mg every 14 days  Vascepa 1g 2 caps twice daily -Current treatment: Aspirin 81 mg daily  -Medications previously tried: Eztimibe, Aotrvastatin, Rosuvastatin, Livalo, colesevelam, cholestyramine  -Tolerating lipid regimen well, reports it is affordable. Counseled patient on importance of limiting fat in diet in addition to medication changes. -Recommended to continue current medication  Diabetes (A1c goal <8%) -Not ideally controlled -Current medications: Jardiance 10 mg daily  Glipizide XL 10 mg daily  Metformin 500 mg daily  Januvia 100 mg daily  -Medications previously tried: Trulicity (itching), -Current home glucose readings fasting glucose: 160s -Denies hypoglycemic/hyperglycemic symptoms -Metformin contraindicated in eGFR <30 due to risk of lactic acidosis  -Januvia dose  likely too high for patient renal function, but patient concerned about rapid changes and worsening blood sugars as a result. Could consider switching Januvia for Ozempic, but patient does report she experienced itching with Trulicity 1.5 mg dose.  -STOP Metformin  -Recommend stopping Januvia, starting Ozempic 0.25 mg weekly for 4 weeks.   Depression (Goal: maintain symptom remission) -Not ideally controlled -Current treatment: Amitriptyline 10 mg nightly  Paroxetine 20 mg  daily  -Medications previously tried/failed: NA -Symptoms historically well controlled with paroxetine. Patient reports that she "cried all the time" when she stopped paroxetine, so is hesitant to stop paroxetine completely.  -Worsening dry mouth since starting Jardiance + amitriptyline. Would assume symptoms are coming from amitriptyline + paroxetine combination. Will consider decreasing paroxetine at next visit.   -PHQ9: 0 -Recommended to continue current medication  Insomnia (Goal: Improve sleep) -Controlled -Current treatment  Amitriptyline 10 mg nightly Trazodone 50 mg 1/2 tablet nightly as needed  -Medications previously tried: NA -Patient reports improved sleep since starting amitriptyline, but does report grogginess throughout the morning.   -Rarely takes trazodone due to not knowing best way to take. Counseled to take if she cannot achieve good quality sleep for 2 nights in a row.  -Most nights sleeps 10p-8a. Wakes up 2-3 times nightly, but is able to fall back asleep immediately after.  -Counseled on proper sleep hygiene  -Recommended to continue current medication  Hypothyroidism (Goal: Maintain thyroid function) -Controlled -Current treatment  Levothyroxine 100 mcg daily  -Medications previously tried: NA  -Recommended to continue current medication  GERD (Goal: Prevent heartburn symptoms) -Controlled -Current treatment  Pantoprazole 40 mg daily  -Medications previously tried: NA -Patient reports rebound symptoms if she skips her pantoprazole.   -Recommended to continue current medication  Chronic Pain (Goal: Minimize pain) -Controlled -Current treatment  Acetaminophen CR 650 mg every 8 hours as needed Amitriptyline 10 mg nightly  Simponi injections every 2 months  -Medications previously tried: NA -Counseled to avoid NSAID use.  -Counseled to limit acetaminophen to 3000 mg daily.   -Recommended to continue current medication  Chronic Kidney Disease Stage 4   -All medications assessed for renal dosing and appropriateness in chronic kidney disease. -Metformin contraindicated in eGFR <30 due to risk of lactic acidosis  -Januvia max recommended dose 25 mg daily  -Recommended to continue current medication  Patient Goals/Self-Care Activities Patient will:  - check glucose daily before breakfast, document, and provide at future appointments check blood pressure weekly, document, and provide at future appointments  Follow Up Plan: Telephone follow up appointment with care management team member scheduled for:  03/02/2021 at 3:00 PM    [name] was given information about Chronic Care Management services today including:  CCM service includes personalized support from designated clinical staff supervised by her physician, including individualized plan of care and coordination with other care providers 24/7 contact phone numbers for assistance for urgent and routine care needs. Standard insurance, coinsurance, copays and deductibles apply for chronic care management only during months in which we provide at least 20 minutes of these services. Most insurances cover these services at 100%, however patients may be responsible for any copay, coinsurance and/or deductible if applicable. This service may help you avoid the need for more expensive face-to-face services. Only one practitioner may furnish and bill the service in a calendar month. The patient may stop CCM services at any time (effective at the end of the month) by phone call to the office staff.  Patient agreed to services  and verbal consent obtained.   Print copy of patient instructions, educational materials, and care plan provided in person.  Junius Argyle, PharmD, [place], CPP Clinical Pharmacist Practitioner  [facility]  [phone]

## 2021-01-02 ENCOUNTER — Other Ambulatory Visit: Payer: Self-pay

## 2021-01-03 ENCOUNTER — Encounter: Payer: Self-pay | Admitting: Family Medicine

## 2021-01-03 ENCOUNTER — Ambulatory Visit (INDEPENDENT_AMBULATORY_CARE_PROVIDER_SITE_OTHER): Payer: Medicare Other | Admitting: Family Medicine

## 2021-01-03 VITALS — BP 124/70 | HR 88 | Temp 96.5°F | Ht 63.0 in | Wt 149.4 lb

## 2021-01-03 DIAGNOSIS — F3342 Major depressive disorder, recurrent, in full remission: Secondary | ICD-10-CM | POA: Diagnosis not present

## 2021-01-03 DIAGNOSIS — E1140 Type 2 diabetes mellitus with diabetic neuropathy, unspecified: Secondary | ICD-10-CM | POA: Diagnosis not present

## 2021-01-03 DIAGNOSIS — N184 Chronic kidney disease, stage 4 (severe): Secondary | ICD-10-CM

## 2021-01-03 DIAGNOSIS — E1122 Type 2 diabetes mellitus with diabetic chronic kidney disease: Secondary | ICD-10-CM

## 2021-01-03 MED ORDER — OZEMPIC (0.25 OR 0.5 MG/DOSE) 2 MG/1.5ML ~~LOC~~ SOPN: 0.2500 mg | PEN_INJECTOR | SUBCUTANEOUS | 2 refills | Status: DC

## 2021-01-03 NOTE — Progress Notes (Signed)
[facility] [address] [place] [address] Dept: [phone] Dept Fax: [phone]  Office Visit  Subjective:    Patient ID: [name], female    DOB: 1940-04-11, 80 y.o..   MRN: [id number]  Chief Complaint  Patient presents with   Follow-up    3 week f/u on DM. Pt fasting blood sugars at home has been ranging 160s-190s.    History of Present Illness:  Patient is in today for discussion of medication changes. [name] had recently met with [name] (PharmD) for Chronic Care Management. He had several recommendations for medication changes to consider.  [name] has a history of Type 2 diabetes. She has currently been managed on metformin, sitagliptin, empagliflozin, and glipizide. She has had progressive chronic kidney disease, now Stage IV. She does monitor her fasting blood sugars at home. They are running 180-200.  [name] has a history of depression. She is well managed on paroxetine. She has noted issues with dry mouth. She also takes amitriptyline for neuropathy. The combination of these medications and her Jardiance could be contributing to the dry mouth. She notes when she has been off of paroxetine int he past, her depression resurfaced.  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   Primary insomnia 11/02/2020   Irritation of eyelid 07/26/2020   S/P aortic valve replacement with bioprosthetic valve 05/04/2020   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   PAD (peripheral artery disease) (HCC)    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[place]) 04/07/2019   Low back pain 02/03/2019   Scoliosis deformity of spine 02/03/2019   Osteoarthritis of hip 11/17/2018   Claudication ([place])    Chronic headaches 11/12/2017   Iron deficiency anemia 11/12/2017   Degeneration of lumbar  intervertebral disc 06/13/2017   Hyperkalemia 06/03/2017   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[place])    Mass of left adrenal gland ([place]) 04/19/2017   Spinal stenosis of lumbar region 10/20/2014   Hyperlipidemia 03/16/2013   Dense breasts 12/22/2012   Anxiety state 04/16/2012   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/16/2012   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   Osteoarthritis of knee 04/16/2012   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 female breast ([place]) 12/20/2010   Depression 09/14/2008   GERD 09/14/2008   Type 2 diabetes mellitus with diabetic neuropathy, without long-term current use of insulin ([place]) 05/20/2008   Essential hypertension 04/17/2007   Coronary atherosclerosis 04/17/2007   Hypothyroidism 04/03/2007    Past Surgical History:  Procedure Laterality Date   ABDOMINAL AORTOGRAM W/LOWER EXTREMITY Bilateral 04/29/2019   Procedure: ABDOMINAL AORTOGRAM W/LOWER EXTREMITY;  Surgeon: Marty Heck, MD;  Location: [facility];  Service: Cardiovascular;  Laterality: Bilateral;   ABDOMINAL AORTOGRAM W/LOWER EXTREMITY Left 07/01/2019   Procedure: ABDOMINAL AORTOGRAM W/ Left LOWER EXTREMITY [place];  Surgeon: Marty Heck, MD;  Location: [facility];  Service: Cardiovascular;  Laterality: Left;   ANTERIOR AND POSTERIOR REPAIR N/A 04/05/2016   Procedure: ANTERIOR (CYSTOCELE) AND POSTERIOR REPAIR (RECTOCELE);  Surgeon: [name], MD;  Location: [facility];  Service: Gynecology;  Laterality: N/A;   AORTIC VALVE REPLACEMENT N/A 05/04/2020   Procedure: AORTIC VALVE REPLACEMENT (AVR) USING INSPIRIS 23MM RESILIA AORTIC VALVE;  Surgeon: Rexene Alberts, MD;  Location: [facility];  Service: Open Heart Surgery;  Laterality: N/A;  BREAST BIOPSY     BREAST LUMPECTOMY  1983   benign biopsy   BREAST LUMPECTOMY Right 12/29/2010   snbx, ER/PR +, Her2 -, 0/1 node pos.   CARPAL TUNNEL RELEASE Bilateral 9/12,8,12   CHOLECYSTECTOMY     COLONOSCOPY  N/A 02/09/2013   Procedure: COLONOSCOPY;  Surgeon: [name], MD;  Location: [facility];  Service: Endoscopy;  Laterality: N/A;   COLONOSCOPY     CORONARY ANGIOPLASTY     CORONARY ARTERY BYPASS GRAFT N/A 05/04/2020   Procedure: CORONARY ARTERY BYPASS GRAFTING (CABG), ON PUMP, TIMES TWO, USING LEFT INTERNAL MAMMARY ARTERY AND RIGHT ENDOSCOPICALLY HARVESTED GREATER SAPHENOUS VEIN;  Surgeon: Rexene Alberts, MD;  Location: [facility];  Service: Open Heart Surgery;  Laterality: N/A;   DILATION AND CURETTAGE OF UTERUS     ESOPHAGOGASTRODUODENOSCOPY N/A 02/09/2013   Procedure: ESOPHAGOGASTRODUODENOSCOPY (EGD);  Surgeon: [name], MD;  Location: [name];  Service: Endoscopy;  Laterality: N/A;   FOOT SPURS     HYSTEROSCOPY WITH D & C N/A 09/11/2012   Procedure: DILATATION AND CURETTAGE ;  Surgeon: [name], MD;  Location: [facility];  Service: Gynecology;  Laterality: N/A;   KNEE SURGERY  2007   LAPAROSCOPIC ASSISTED VAGINAL HYSTERECTOMY N/A 04/05/2016   Procedure: LAPAROSCOPIC ASSISTED VAGINAL HYSTERECTOMY possible BSO;  Surgeon: [name], MD;  Location: [facility];  Service: Gynecology;  Laterality: N/A;   LUMBAR LAMINECTOMY/DECOMPRESSION MICRODISCECTOMY N/A 10/20/2014   Procedure: MICRO LUMBER DECOMPRESSION L3-4 L4-5;  Surgeon: [name], MD;  Location: [facility];  Service: Orthopedics;  Laterality: N/A;   LUMBAR LAMINECTOMY/DECOMPRESSION MICRODISCECTOMY Bilateral 10/13/2015   Procedure: MICRO LUMBAR DECOMPRESSION L5 - S1 AND REDO DECOMPRESSION L4 - L5 AND REMOVAL OF FACET CYST L4 - L5 2 LEVELS;  Surgeon: [name], MD;  Location: [facility];  Service: Orthopedics;  Laterality: Bilateral;   PERIPHERAL VASCULAR INTERVENTION Right 04/29/2019   Procedure: PERIPHERAL VASCULAR INTERVENTION;  Surgeon: Marty Heck, MD;  Location: [facility];  Service: Cardiovascular;  Laterality: Right;   PORT-A-CATH REMOVAL  04/17/2011   Procedure: REMOVAL PORT-A-CATH;  Surgeon: [name], MD;  Location: [facility];  Service: General;  Laterality: Left;   PORTACATH PLACEMENT  02/07/2011   Procedure: INSERTION PORT-A-CATH;  Surgeon: [name], MD;  [facility]: [facility];  Service: General;  Laterality: N/A;   RIGHT/LEFT HEART CATH AND CORONARY ANGIOGRAPHY N/A 03/17/2020   Procedure: RIGHT/LEFT HEART CATH AND CORONARY ANGIOGRAPHY;  Surgeon: Burnell Blanks, MD;  Location: [facility];  Service: Cardiovascular;  Laterality: N/A;   TEE WITHOUT CARDIOVERSION N/A 05/04/2020   Procedure: TRANSESOPHAGEAL ECHOCARDIOGRAM (TEE);  Surgeon: Rexene Alberts, MD;  Location: [facility];  Service: Open Heart Surgery;  Laterality: N/A;   UPPER GASTROINTESTINAL ENDOSCOPY     Family History  Problem Relation Age of Onset   Kidney disease Mother    Heart disease Mother    Throat cancer Father    Alcoholism Father    Heart attack Father    Lymphoma Brother 42   Heart attack Brother    Diabetes Brother    Hypertension Brother    Cancer Son    Colon cancer Neg Hx    Stroke Neg Hx    Esophageal cancer Neg Hx    Rectal cancer Neg Hx    Stomach cancer Neg Hx     Outpatient Medications Prior to Visit  Medication Sig Dispense Refill   Accu-Chek FastClix Lancets MISC USE TO CHECK BLOOD SUGARS  3 TIMES DAILY 306 each 3   acetaminophen (TYLENOL) 650 MG CR tablet Take 650 mg by mouth every 8 (eight) hours as needed for pain.     amitriptyline (ELAVIL) 10 MG tablet Take 1 tablet (10 mg total) by mouth at bedtime. 30 tablet 2   aspirin EC 81 MG tablet Take 81 mg by mouth at bedtime.     blood glucose meter kit and supplies Dispense based on patient and insurance preference. Use up to four times daily as directed. (FOR ICD-10 E10.9, E11.9). 1 each 0   Blood Glucose Monitoring Suppl DEVI by Does not apply route. Accu Check Guide     Cholecalciferol (VITAMIN D3) 25 MCG (1000 UT) CAPS Take 2,000 Units by mouth daily before lunch.     empagliflozin (JARDIANCE) 10 MG  TABS tablet Take 1 tablet (10 mg total) by mouth daily before breakfast. 30 tablet 2   Evolocumab (REPATHA SURECLICK) 175 MG/ML SOAJ Inject 1 pen into the skin every 14 (fourteen) days. 2 mL 11   furosemide (LASIX) 20 MG tablet Take 1 tablet (20 mg total) by mouth daily. 90 tablet 3   glipiZIDE (GLUCOTROL XL) 10 MG 24 hr tablet Take 1 tablet (10 mg total) by mouth daily with breakfast. 90 tablet 3   glucose blood (ACCU-CHEK GUIDE) test strip TEST ONCE DAILY 100 each 5   glucose blood test strip 1 each by Other route as needed for other. Use as instructed  Please dispense Accu check Guide Test strips     Golimumab (SIMPONI ARIA IV) Inject into the vein every 2 (two) months.     icosapent Ethyl (VASCEPA) 1 g capsule Take 2 capsules (2 g total) by mouth 2 (two) times daily. 120 capsule 11   levothyroxine (SYNTHROID) 100 MCG tablet Take 1 tablet (100 mcg total) by mouth daily. 30 tablet 3   losartan (COZAAR) 50 MG tablet Take 50 mg by mouth daily.     Magnesium 250 MG TABS Take 250 mg by mouth at bedtime.     metoprolol tartrate (LOPRESSOR) 25 MG tablet TAKE 1 TABLET(25 MG) BY MOUTH TWICE DAILY 180 tablet 3   pantoprazole (PROTONIX) 40 MG tablet Take 1 tablet (40 mg total) by mouth daily. 90 tablet 3   PARoxetine (PAXIL) 20 MG tablet Take 1 tablet (20 mg total) by mouth in the morning. 90 tablet 3   traZODone (DESYREL) 50 MG tablet Take 0.5 tablets (25 mg total) by mouth at bedtime as needed for sleep. 30 tablet 6   valACYclovir (VALTREX) 1000 MG tablet Take 1 tablet (1,000 mg total) by mouth daily as needed (Fever Blisters). 180 tablet 0   sitaGLIPtin (JANUVIA) 100 MG tablet Take 1 tablet (100 mg total) by mouth daily. 90 tablet 3   No facility-administered medications prior to visit.   Allergies  Allergen Reactions   Codeine Nausea And Vomiting and Other (See Comments)    Severe stomach cramps   Antihistamines, Diphenhydramine-Type Other (See Comments)    Causes hyperactivity   Gabapentin      Urinary incontinence   Statins Other (See Comments)    Joint pains   Sulfa Antibiotics     Unknown to pt   Erythromycin Hives and Rash    Due to dental work about 50 years ago. Was used in packing and resulted in rash/hives inside and outside of mouth.   Trulicity [Dulaglutide] Itching    Objective:   Today's Vitals   01/03/21 0758  BP: 124/70  Pulse:  88  Temp: (!) 96.5 F (35.8 C)  TempSrc: Temporal  SpO2: 97%  Weight: 149 lb 6.4 oz (67.8 kg)  Height: '5\' 3"'  (1.6 m)   Body mass index is 26.47 kg/m.   General: Well developed, well nourished. No acute distress. Psych: Alert and oriented x3. Normal mood and affect.  Health Maintenance Due  Topic Date Due   Hepatitis C Screening  Never done   TETANUS/TDAP  Never done   Zoster Vaccines- Shingrix (2 of 2) 03/29/2011   FOOT EXAM  03/18/2019   Lab Results Last metabolic panel Lab Results  Component Value Date   GLUCOSE 139 (H) 11/02/2020   NA 139 11/02/2020   K 5.3 No hemolysis seen (H) 11/02/2020   CL 101 11/02/2020   CO2 26 11/02/2020   BUN 30 (H) 11/02/2020   CREATININE 1.92 (H) 11/02/2020   EGFR 29 (L) 10/26/2020   CALCIUM 9.8 11/02/2020   PROT 7.0 07/20/2020   ALBUMIN 4.0 07/20/2020   LABGLOB 2.5 05/16/2018   AGRATIO 1.7 05/16/2018   BILITOT 0.5 07/20/2020   ALKPHOS 63 07/20/2020   AST 16 07/20/2020   ALT 9 07/20/2020   ANIONGAP 8 05/09/2020   Last hemoglobin A1c Lab Results  Component Value Date   HGBA1C 7.2 (H) 11/02/2020     Assessment & Plan:   1. Type 2 diabetes mellitus with diabetic neuropathy, without long-term current use of insulin ([place]) 2. 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 Bok has stopped metformin as recommend by Mr. Fleury. We discussed a trial of semaglutide, though she had some itching previously when on dulaglutide (after a dosage increase). I did discuss possibly starting a daily basal insulin dose. She notes she wants to avoid insulin until she has not  other options. If her blood sugar control is not improved with the changes we make today, I will readdress this issue.  - Semaglutide,0.25 or 0.5MG/DOS, (OZEMPIC, 0.25 OR 0.5 MG/DOSE,) 2 MG/1.5ML SOPN; Inject 0.25 mg into the skin once a week.  Dispense: 1.5 mL; Refill: 2  3. Recurrent major depressive disorder, in full remission [facility]) Mr. Michaelle Birks had suggested possibly decreasing the paroxetine dose. Ms. Albert is hesitant to do this, which I understand. I will reassess ehr on 1/12 (as scheduled). If the dry mouth continues to be a problem, we may actually need to stop her amitriptyline and consider other approaches to managing her neuropathy.  [name], MD

## 2021-01-03 NOTE — Patient Instructions (Signed)
You have already stopped metformin. Stop Januvia (sitagliptin) Start Ozempic (semaglutide) 0.25 mg injection weekly

## 2021-01-04 ENCOUNTER — Telehealth: Payer: Self-pay

## 2021-01-04 NOTE — Telephone Encounter (Signed)
Called and confirmed with pt refill requests needed, pt stated all med refills are UTD.

## 2021-01-17 ENCOUNTER — Encounter: Payer: Self-pay | Admitting: Gastroenterology

## 2021-01-17 ENCOUNTER — Other Ambulatory Visit: Payer: Self-pay | Admitting: Family Medicine

## 2021-01-19 ENCOUNTER — Other Ambulatory Visit: Payer: Self-pay

## 2021-01-20 ENCOUNTER — Ambulatory Visit (INDEPENDENT_AMBULATORY_CARE_PROVIDER_SITE_OTHER): Payer: Medicare Other | Admitting: Family Medicine

## 2021-01-20 ENCOUNTER — Encounter: Payer: Self-pay | Admitting: Family Medicine

## 2021-01-20 ENCOUNTER — Ambulatory Visit (INDEPENDENT_AMBULATORY_CARE_PROVIDER_SITE_OTHER)
Admission: RE | Admit: 2021-01-20 | Discharge: 2021-01-20 | Disposition: A | Discharge status: Home / Self Care | Payer: Medicare Other | Source: Ambulatory Visit | Attending: Family Medicine | Admitting: Family Medicine

## 2021-01-20 VITALS — BP 134/72 | HR 95 | Temp 97.2°F | Ht 63.0 in | Wt 150.4 lb

## 2021-01-20 DIAGNOSIS — J4521 Mild intermittent asthma with (acute) exacerbation: Secondary | ICD-10-CM | POA: Diagnosis not present

## 2021-01-20 DIAGNOSIS — J22 Unspecified acute lower respiratory infection: Secondary | ICD-10-CM | POA: Insufficient documentation

## 2021-01-20 DIAGNOSIS — J45909 Unspecified asthma, uncomplicated: Secondary | ICD-10-CM | POA: Insufficient documentation

## 2021-01-20 LAB — CBC
HCT: 42.6 % (ref 35.0–45.0)
Hemoglobin: 14.5 g/dL (ref 11.7–15.5)
MCH: 30.9 pg (ref 27.0–33.0)
MCHC: 34 g/dL (ref 32.0–36.0)
MCV: 90.8 fL (ref 80.0–100.0)
MPV: 10.1 fL (ref 7.5–12.5)
Platelets: 279 10*3/uL (ref 140–400)
RBC: 4.69 10*6/uL (ref 3.80–5.10)
RDW: 13.7 % (ref 11.0–15.0)
WBC: 6.5 10*3/uL (ref 3.8–10.8)

## 2021-01-20 MED ORDER — PREDNISONE 10 MG PO TABS: 10.0000 mg | ORAL_TABLET | Freq: Two times a day (BID) | ORAL | 0 refills | Status: DC

## 2021-01-20 MED ORDER — BENZONATATE 200 MG PO CAPS
200.0000 mg | ORAL_CAPSULE | Freq: Two times a day (BID) | ORAL | 0 refills | Status: DC | PRN
Start: 2021-01-20 — End: 2021-02-02

## 2021-01-20 MED ORDER — AMOXICILLIN-POT CLAVULANATE 875-125 MG PO TABS: 1.0000 | ORAL_TABLET | Freq: Two times a day (BID) | ORAL | 0 refills | Status: DC

## 2021-01-20 NOTE — Progress Notes (Signed)
Established Patient Office Visit  Subjective:  Patient ID: [name], female    DOB: 1940/12/20  Age: 80 y.o. MRN: [id number]  CC:  Chief Complaint  Patient presents with   Acute Visit    C/o having a deep cough x 1 week, chest congestions and runny nose.   Negative Covid test  and has taken Musinex with little relief.      HPI [name] presents for treatment and evaluation of a 5-day history of URI signs and symptoms with nasal congestion postnasal drip cough and tightness in the chest.  There is been some wheezing.  Cough is been minimally productive.  There have been no fever or chills.  Denies headache or sore throat.  Denies myalgias or arthralgias.  Quit smoking in 1999.  Denies history of COPD or asthma.  Chest x-ray from May of this year showed chronic interstitial coarsening.  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, inv mammary, in situ,ER/PR +,HER2 -   Cancer ([place])    Carcinoma of breast treated with adjuvant chemotherapy ([place])    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[place])    Claudication ([place])    Coronary artery disease    stent 2007   Depression    Diabetes mellitus    Difficulty sleeping    Diverticulosis 12/10/03   Elevated cholesterol    Generalized weakness    GERD (gastroesophageal reflux disease)    Heart murmur    Hepatitis    as an infant   HSV-1 (herpes simplex virus 1) infection    Hyperlipidemia    Hypertension    Hypothyroidism    Osteoarthritis    PAD (peripheral artery disease) ([place])    Personal history of chemotherapy    Personal history of radiation therapy    Pneumonia    2014 september and March 2022   Rheumatoid arthritis ([place])    S/P aortic valve replacement with bioprosthetic valve 05/04/2020   [facility] Inspiris Resilia stented bovine pericardial tissue  valve, size 23 mm   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   LIMA to D3, SVG to PDA, EVH via right thigh   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   Dr [name]   Spinal stenosis    Thyroid disease     Past Surgical History:  Procedure Laterality Date   ABDOMINAL AORTOGRAM W/LOWER EXTREMITY Bilateral 04/29/2019   Procedure: ABDOMINAL AORTOGRAM W/LOWER EXTREMITY;  Surgeon: Marty Heck, MD;  Location: [facility];  Service: Cardiovascular;  Laterality: Bilateral;   ABDOMINAL AORTOGRAM W/LOWER EXTREMITY Left 07/01/2019   Procedure: ABDOMINAL AORTOGRAM W/ Left LOWER EXTREMITY [place];  Surgeon: Marty Heck, MD;  Location: [facility];  Service: Cardiovascular;  Laterality: Left;   ANTERIOR AND POSTERIOR REPAIR N/A 04/05/2016   Procedure: ANTERIOR (CYSTOCELE) AND POSTERIOR REPAIR (RECTOCELE);  Surgeon: [name], MD;  Location: [facility];  Service: Gynecology;  Laterality: N/A;   AORTIC VALVE REPLACEMENT N/A 05/04/2020   Procedure: AORTIC VALVE REPLACEMENT (AVR) USING INSPIRIS 23MM RESILIA AORTIC VALVE;  Surgeon: Rexene Alberts, MD;  Location: [facility];  Service: Open Heart Surgery;  Laterality: N/A;   BREAST BIOPSY     BREAST LUMPECTOMY  1983   benign biopsy  BREAST LUMPECTOMY Right 12/29/2010   snbx, ER/PR +, Her2 -, 0/1 node pos.   CARPAL TUNNEL RELEASE Bilateral 9/12,8,12   CHOLECYSTECTOMY     COLONOSCOPY N/A 02/09/2013   Procedure: COLONOSCOPY;  Surgeon: [name], MD;  [facility]: [facility];  Service: Endoscopy;  Laterality: N/A;   COLONOSCOPY     CORONARY ANGIOPLASTY     CORONARY ARTERY BYPASS GRAFT N/A 05/04/2020   Procedure: CORONARY ARTERY BYPASS GRAFTING (CABG), ON PUMP, TIMES TWO, USING LEFT INTERNAL MAMMARY ARTERY AND RIGHT ENDOSCOPICALLY HARVESTED GREATER SAPHENOUS VEIN;  Surgeon: Rexene Alberts, MD;  Location: [facility];  Service: Open Heart Surgery;  Laterality: N/A;   DILATION AND CURETTAGE OF UTERUS     ESOPHAGOGASTRODUODENOSCOPY N/A 02/09/2013   Procedure:  ESOPHAGOGASTRODUODENOSCOPY (EGD);  Surgeon: [name], MD;  Location: [name];  Service: Endoscopy;  Laterality: N/A;   FOOT SPURS     HYSTEROSCOPY WITH D & C N/A 09/11/2012   Procedure: DILATATION AND CURETTAGE ;  Surgeon: [name], MD;  Location: [facility];  Service: Gynecology;  Laterality: N/A;   KNEE SURGERY  2007   LAPAROSCOPIC ASSISTED VAGINAL HYSTERECTOMY N/A 04/05/2016   Procedure: LAPAROSCOPIC ASSISTED VAGINAL HYSTERECTOMY possible BSO;  Surgeon: [name], MD;  Location: [facility];  Service: Gynecology;  Laterality: N/A;   LUMBAR LAMINECTOMY/DECOMPRESSION MICRODISCECTOMY N/A 10/20/2014   Procedure: MICRO LUMBER DECOMPRESSION L3-4 L4-5;  Surgeon: [name], MD;  [facility]: [facility];  Service: Orthopedics;  Laterality: N/A;   LUMBAR LAMINECTOMY/DECOMPRESSION MICRODISCECTOMY Bilateral 10/13/2015   Procedure: MICRO LUMBAR DECOMPRESSION L5 - S1 AND REDO DECOMPRESSION L4 - L5 AND REMOVAL OF FACET CYST L4 - L5 2 LEVELS;  Surgeon: [name], MD;  [facility]: [facility];  Service: Orthopedics;  Laterality: Bilateral;   PERIPHERAL VASCULAR INTERVENTION Right 04/29/2019   Procedure: PERIPHERAL VASCULAR INTERVENTION;  Surgeon: Marty Heck, MD;  Location: [facility];  Service: Cardiovascular;  Laterality: Right;   PORT-A-CATH REMOVAL  04/17/2011   Procedure: REMOVAL PORT-A-CATH;  Surgeon: [name], MD;  Location: [facility];  Service: General;  Laterality: Left;   PORTACATH PLACEMENT  02/07/2011   Procedure: INSERTION PORT-A-CATH;  Surgeon: [name], MD;  [facility];  Service: General;  Laterality: N/A;   RIGHT/LEFT HEART CATH AND CORONARY ANGIOGRAPHY N/A 03/17/2020   Procedure: RIGHT/LEFT HEART CATH AND CORONARY ANGIOGRAPHY;  Surgeon: Burnell Blanks, MD;  Location: [facility];  Service: Cardiovascular;  Laterality: N/A;   TEE WITHOUT CARDIOVERSION N/A 05/04/2020   Procedure: TRANSESOPHAGEAL ECHOCARDIOGRAM (TEE);   Surgeon: Rexene Alberts, MD;  Location: [facility];  Service: Open Heart Surgery;  Laterality: N/A;   UPPER GASTROINTESTINAL ENDOSCOPY      Family History  Problem Relation Age of Onset   Kidney disease Mother    Heart disease Mother    Throat cancer Father    Alcoholism Father    Heart attack Father    Lymphoma Brother 50   Heart attack Brother    Diabetes Brother    Hypertension Brother    Cancer Son    Colon cancer Neg Hx    Stroke Neg Hx    Esophageal cancer Neg Hx    Rectal cancer Neg Hx    Stomach cancer Neg Hx     Social History   Socioeconomic History   Marital status: Married    Spouse name: Not on file   Number of children: 3   Years of education: Not on file   Highest  education level: Not on file  Occupational History   Occupation: retired-bookkeeping  Tobacco Use   Smoking status: Former    Packs/day: 2.00    Years: 40.00    Pack years: 80.00    Types: Cigarettes    Quit date: 12/19/1997    Years since quitting: 23.1   Smokeless tobacco: Never  Vaping Use   Vaping Use: Never used  Substance and Sexual Activity   Alcohol use: Yes    Alcohol/week: 1.0 standard drink    Types: 1 Glasses of wine per week    Comment: occasiona - maybe once a month   Drug use: No   Sexual activity: Not Currently    Comment: menarch age 39, P57, HRT X 10 YRS, MENOPAUSE MID 40'S  Other Topics Concern   Not on file  Social History Narrative   One daughter died from suicide at age 66.   Social Determinants of Health   Financial Resource Strain: Low Risk    Difficulty of Paying Living Expenses: Not hard at all  Food Insecurity: No Food Insecurity   Worried About Charity fundraiser in the Last Year: Never true   [place] in the Last Year: Never true  Transportation Needs: No Transportation Needs   Lack of Transportation (Medical): No   Lack of Transportation (Non-Medical): No  Physical Activity: Insufficiently Active   Days of Exercise per Week: 2 days    Minutes of Exercise per Session: 20 min  Stress: No Stress Concern Present   Feeling of Stress : Only a little  Social Connections: Moderately Integrated   Frequency of Communication with Friends and Family: Three times a week   Frequency of Social Gatherings with Friends and Family: Twice a week   Attends Religious Services: More than 4 times per year   Active Member of [employer] or Organizations: No   Attends Archivist Meetings: Never   Marital Status: Married  Human resources officer Violence: Not At Risk   Fear of Current or Ex-Partner: No   Emotionally Abused: No   Physically Abused: No   Sexually Abused: No    Outpatient Medications Prior to Visit  Medication Sig Dispense Refill   Accu-Chek FastClix Lancets MISC USE TO CHECK BLOOD SUGARS 3 TIMES DAILY 306 each 3   acetaminophen (TYLENOL) 650 MG CR tablet Take 650 mg by mouth every 8 (eight) hours as needed for pain.     aspirin EC 81 MG tablet Take 81 mg by mouth at bedtime.     blood glucose meter kit and supplies Dispense based on patient and insurance preference. Use up to four times daily as directed. (FOR ICD-10 E10.9, E11.9). 1 each 0   Blood Glucose Monitoring Suppl DEVI by Does not apply route. Accu Check Guide     Cholecalciferol (VITAMIN D3) 25 MCG (1000 UT) CAPS Take 2,000 Units by mouth daily before lunch.     empagliflozin (JARDIANCE) 10 MG TABS tablet Take 1 tablet (10 mg total) by mouth daily before breakfast. 30 tablet 2   Evolocumab (REPATHA SURECLICK) 025 MG/ML SOAJ Inject 1 pen into the skin every 14 (fourteen) days. 2 mL 11   furosemide (LASIX) 20 MG tablet Take 1 tablet (20 mg total) by mouth daily. 90 tablet 3   glipiZIDE (GLUCOTROL XL) 10 MG 24 hr tablet Take 1 tablet (10 mg total) by mouth daily with breakfast. 90 tablet 3   glucose blood (ACCU-CHEK GUIDE) test strip TEST ONCE DAILY 100 each 5  glucose blood test strip 1 each by Other route as needed for other. Use as instructed  Please dispense Accu  check Guide Test strips     Golimumab (SIMPONI ARIA IV) Inject into the vein every 2 (two) months.     icosapent Ethyl (VASCEPA) 1 g capsule Take 2 capsules (2 g total) by mouth 2 (two) times daily. 120 capsule 11   levothyroxine (SYNTHROID) 100 MCG tablet Take 1 tablet (100 mcg total) by mouth daily. 30 tablet 3   losartan (COZAAR) 50 MG tablet Take 50 mg by mouth daily.     Magnesium 250 MG TABS Take 250 mg by mouth at bedtime.     metoprolol tartrate (LOPRESSOR) 25 MG tablet TAKE 1 TABLET(25 MG) BY MOUTH TWICE DAILY 180 tablet 3   pantoprazole (PROTONIX) 40 MG tablet Take 1 tablet (40 mg total) by mouth daily. 90 tablet 3   PARoxetine (PAXIL) 20 MG tablet Take 1 tablet (20 mg total) by mouth in the morning. 90 tablet 3   Semaglutide,0.25 or 0.5MG/DOS, (OZEMPIC, 0.25 OR 0.5 MG/DOSE,) 2 MG/1.5ML SOPN Inject 0.25 mg into the skin once a week. 1.5 mL 2   traZODone (DESYREL) 50 MG tablet Take 0.5 tablets (25 mg total) by mouth at bedtime as needed for sleep. 30 tablet 6   valACYclovir (VALTREX) 1000 MG tablet Take 1 tablet (1,000 mg total) by mouth daily as needed (Fever Blisters). 180 tablet 0   amitriptyline (ELAVIL) 10 MG tablet Take 1 tablet (10 mg total) by mouth at bedtime. (Patient not taking: Reported on 01/20/2021) 30 tablet 2   No facility-administered medications prior to visit.    Allergies  Allergen Reactions   Codeine Nausea And Vomiting and Other (See Comments)    Severe stomach cramps   Antihistamines, Diphenhydramine-Type Other (See Comments)    Causes hyperactivity   Gabapentin     Urinary incontinence   Statins Other (See Comments)    Joint pains   Sulfa Antibiotics     Unknown to pt   Erythromycin Hives and Rash    Due to dental work about 50 years ago. Was used in packing and resulted in rash/hives inside and outside of mouth.   Trulicity [Dulaglutide] Itching    ROS Review of Systems  Constitutional:  Positive for fatigue. Negative for diaphoresis, fever and  unexpected weight change.  HENT:  Positive for congestion, postnasal drip and rhinorrhea. Negative for sinus pressure, sinus pain and sore throat.   Eyes:  Negative for photophobia and visual disturbance.  Respiratory:  Positive for cough and wheezing.   Cardiovascular:  Negative for chest pain and palpitations.  Gastrointestinal:  Negative for nausea and vomiting.  Genitourinary: Negative.   Musculoskeletal:  Negative for arthralgias and myalgias.  Neurological:  Negative for speech difficulty, weakness and headaches.     Objective:    Physical Exam Vitals and nursing note reviewed.  Constitutional:      General: She is not in acute distress.    Appearance: Normal appearance. She is not toxic-appearing or diaphoretic.  HENT:     Head: Normocephalic and atraumatic.     Right Ear: Tympanic membrane, ear canal and external ear normal.     Left Ear: Tympanic membrane, ear canal and external ear normal.     Mouth/Throat:     Mouth: Mucous membranes are dry.     Pharynx: Oropharynx is clear. No oropharyngeal exudate or posterior oropharyngeal erythema.  Eyes:     General:  Right eye: No discharge.        Left eye: No discharge.     Extraocular Movements: Extraocular movements intact.     Conjunctiva/sclera: Conjunctivae normal.     Pupils: Pupils are equal, round, and reactive to light.  Neck:     Vascular: No carotid bruit.  Cardiovascular:     Rate and Rhythm: Normal rate and regular rhythm.  Pulmonary:     Effort: Pulmonary effort is normal.     Breath sounds: Decreased air movement present. Examination of the right-upper field reveals rhonchi. Examination of the right-middle field reveals rhonchi. Examination of the right-lower field reveals rhonchi and rales. Decreased breath sounds, rhonchi and rales present.  Abdominal:     General: Bowel sounds are normal.  Musculoskeletal:     Cervical back: No rigidity or tenderness.  Lymphadenopathy:     Cervical: No cervical  adenopathy.  Skin:    General: Skin is warm and dry.  Neurological:     Mental Status: She is alert and oriented to person, place, and time.  Psychiatric:        Mood and Affect: Mood normal.        Behavior: Behavior normal.    BP 134/72    Pulse 95    Temp (!) 97.2 F (36.2 C) (Temporal)    Ht '5\' 3"'  (1.6 m)    Wt 150 lb 6.4 oz (68.2 kg)    SpO2 97%    BMI 26.64 kg/m  Wt Readings from Last 3 Encounters:  01/20/21 150 lb 6.4 oz (68.2 kg)  01/03/21 149 lb 6.4 oz (67.8 kg)  12/19/20 152 lb 12.8 oz (69.3 kg)     Health Maintenance Due  Topic Date Due   Hepatitis C Screening  Never done   TETANUS/TDAP  Never done   Zoster Vaccines- Shingrix (2 of 2) 03/29/2011   FOOT EXAM  03/18/2019    There are no preventive care reminders to display for this patient.  Lab Results  Component Value Date   TSH 13.43 (H) 11/02/2020   Lab Results  Component Value Date   WBC 6.6 11/02/2020   HGB 12.8 11/02/2020   HCT 38.6 11/02/2020   MCV 90.9 11/02/2020   PLT 224.0 11/02/2020   Lab Results  Component Value Date   NA 139 11/02/2020   K 5.3 No hemolysis seen (H) 11/02/2020   CHLORIDE 104 02/21/2016   CO2 26 11/02/2020   GLUCOSE 139 (H) 11/02/2020   BUN 30 (H) 11/02/2020   CREATININE 1.92 (H) 11/02/2020   BILITOT 0.5 07/20/2020   ALKPHOS 63 07/20/2020   AST 16 07/20/2020   ALT 9 07/20/2020   PROT 7.0 07/20/2020   ALBUMIN 4.0 07/20/2020   CALCIUM 9.8 11/02/2020   ANIONGAP 8 05/09/2020   EGFR 29 (L) 10/26/2020   GFR 24.76 (L) 11/02/2020   Lab Results  Component Value Date   CHOL 258 (H) 11/02/2020   Lab Results  Component Value Date   HDL 42.70 11/02/2020   Lab Results  Component Value Date   LDLCALC 83 05/13/2008   Lab Results  Component Value Date   TRIG 214.0 (H) 11/02/2020   Lab Results  Component Value Date   CHOLHDL 6 11/02/2020   Lab Results  Component Value Date   HGBA1C 7.2 (H) 11/02/2020      Assessment & Plan:   Problem List Items Addressed  This Visit       Respiratory   Lower respiratory infection - Primary  Relevant Medications   amoxicillin-clavulanate (AUGMENTIN) 875-125 MG tablet   benzonatate (TESSALON) 200 MG capsule   Other Relevant Orders   DG Chest 2 View   CBC   COVID-19, Flu A+B and RSV   Reactive airway disease   Relevant Medications   predniSONE (DELTASONE) 10 MG tablet    Meds ordered this encounter  Medications   amoxicillin-clavulanate (AUGMENTIN) 875-125 MG tablet    Sig: Take 1 tablet by mouth 2 (two) times daily.    Dispense:  20 tablet    Refill:  0   predniSONE (DELTASONE) 10 MG tablet    Sig: Take 1 tablet (10 mg total) by mouth 2 (two) times daily with a meal for 5 days.    Dispense:  10 tablet    Refill:  0   benzonatate (TESSALON) 200 MG capsule    Sig: Take 1 capsule (200 mg total) by mouth 2 (two) times daily as needed for cough.    Dispense:  20 capsule    Refill:  0    Follow-up: Return follow up with Dr. Gena Fray next week., for To ER if worse. Libby Maw, MD

## 2021-01-22 LAB — COVID-19, FLU A+B AND RSV
Influenza A, NAA: DETECTED — AB
Influenza B, NAA: NOT DETECTED
RSV, NAA: NOT DETECTED
SARS-CoV-2, NAA: NOT DETECTED

## 2021-01-25 ENCOUNTER — Ambulatory Visit (INDEPENDENT_AMBULATORY_CARE_PROVIDER_SITE_OTHER): Payer: Medicare Other | Admitting: Family Medicine

## 2021-01-25 ENCOUNTER — Other Ambulatory Visit: Payer: Self-pay

## 2021-01-25 VITALS — BP 140/82 | HR 85 | Temp 97.6°F | Ht 63.0 in | Wt 144.8 lb

## 2021-01-25 DIAGNOSIS — K219 Gastro-esophageal reflux disease without esophagitis: Secondary | ICD-10-CM | POA: Diagnosis not present

## 2021-01-25 DIAGNOSIS — F3342 Major depressive disorder, recurrent, in full remission: Secondary | ICD-10-CM

## 2021-01-25 DIAGNOSIS — J101 Influenza due to other identified influenza virus with other respiratory manifestations: Secondary | ICD-10-CM | POA: Diagnosis not present

## 2021-01-25 MED ORDER — PANTOPRAZOLE SODIUM 40 MG PO TBEC: 40.0000 mg | DELAYED_RELEASE_TABLET | Freq: Every day | ORAL | 3 refills | Status: DC

## 2021-01-25 MED ORDER — PAROXETINE HCL 20 MG PO TABS: 20.0000 mg | ORAL_TABLET | Freq: Every morning | ORAL | 3 refills | Status: DC

## 2021-01-25 NOTE — Progress Notes (Signed)
[facility] [address] [place] [address] Dept: [phone] Dept Fax: [phone]  Office Visit  Subjective:    Patient ID: [name], female    DOB: 08/09/1940, 81 y.o..   MRN: [id number]  Chief Complaint  Patient presents with   Follow-up    1 week f/u. Still has cough.      History of Present Illness:  Patient is in today for for reassessment of a recent respiratory illness. [name] was seen on 12/30 by Dr. Ethelene Hal with a 5-day history of URI signs and symptoms with nasal congestion, postnasal drip, cough and tightness in the chest.  She had some wheezing.  There was no fever or chills.  She is a past smoker and had a prior chest x-ray in May 2022 showing chronic interstitial coarsening. Dr. Ethelene Hal obtained a CXR which was normal. His exam revealed ronchi in the right chest with rales in the right base. He treated her with prednisone and Augmentin. She has completed the course of prednisone. [name] notes that she is still coughing, though not as bad as before. She notes that she has been a bit dehydrated. She wondered if her semaglutide had contributed to this.  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   Primary insomnia 11/02/2020   Irritation of eyelid 07/26/2020   S/P aortic valve replacement with bioprosthetic valve 05/04/2020   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   PAD (peripheral artery disease) (HCC)    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[place]) 04/07/2019   Low back pain 02/03/2019   Scoliosis deformity of spine 02/03/2019   Osteoarthritis of hip 11/17/2018   Claudication ([place])    Chronic headaches 11/12/2017   Iron deficiency anemia 11/12/2017   Degeneration of lumbar intervertebral  disc 06/13/2017   Hyperkalemia 06/03/2017   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[place])    Mass of left adrenal gland ([place]) 04/19/2017   Spinal stenosis of lumbar region 10/20/2014   Hyperlipidemia 03/16/2013   Dense breasts 12/22/2012   Anxiety state 04/16/2012   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/16/2012   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   Osteoarthritis of knee 04/16/2012   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 female breast ([place]) 12/20/2010   Depression 09/14/2008   GERD 09/14/2008   Type 2 diabetes mellitus with diabetic neuropathy, without long-term current use of insulin ([place]) 05/20/2008   Essential hypertension 04/17/2007   Coronary atherosclerosis 04/17/2007   Hypothyroidism 04/03/2007   Past Surgical History:  Procedure Laterality Date   ABDOMINAL AORTOGRAM W/LOWER EXTREMITY Bilateral 04/29/2019   Procedure: ABDOMINAL AORTOGRAM W/LOWER EXTREMITY;  Surgeon: Marty Heck, MD;  Location: [facility];  Service: Cardiovascular;  Laterality: Bilateral;   ABDOMINAL AORTOGRAM W/LOWER EXTREMITY Left 07/01/2019   Procedure: ABDOMINAL AORTOGRAM W/ Left LOWER EXTREMITY [place];  Surgeon: Marty Heck, MD;  Location: [facility];  Service: Cardiovascular;  Laterality: Left;   ANTERIOR AND POSTERIOR REPAIR N/A 04/05/2016   Procedure: ANTERIOR (CYSTOCELE) AND POSTERIOR REPAIR (RECTOCELE);  Surgeon: [name], MD;  Location: [facility];  Service: Gynecology;  Laterality: N/A;   AORTIC VALVE REPLACEMENT N/A 05/04/2020   Procedure: AORTIC VALVE REPLACEMENT (AVR) USING INSPIRIS 23MM RESILIA AORTIC VALVE;  Surgeon: Rexene Alberts, MD;  Location: [facility];  Service: Open Heart Surgery;  Laterality: N/A;   BREAST BIOPSY     BREAST LUMPECTOMY  1983   benign biopsy   BREAST LUMPECTOMY Right 12/29/2010   snbx, ER/PR +, Her2 -, 0/1 node pos.   CARPAL TUNNEL RELEASE Bilateral 9/12,8,12   CHOLECYSTECTOMY     COLONOSCOPY N/A 02/09/2013    Procedure: COLONOSCOPY;  Surgeon: [name], MD;  Location: [facility];  Service: Endoscopy;  Laterality: N/A;   COLONOSCOPY     CORONARY ANGIOPLASTY     CORONARY ARTERY BYPASS GRAFT N/A 05/04/2020   Procedure: CORONARY ARTERY BYPASS GRAFTING (CABG), ON PUMP, TIMES TWO, USING LEFT INTERNAL MAMMARY ARTERY AND RIGHT ENDOSCOPICALLY HARVESTED GREATER SAPHENOUS VEIN;  Surgeon: Rexene Alberts, MD;  Location: [facility];  Service: Open Heart Surgery;  Laterality: N/A;   DILATION AND CURETTAGE OF UTERUS     ESOPHAGOGASTRODUODENOSCOPY N/A 02/09/2013   Procedure: ESOPHAGOGASTRODUODENOSCOPY (EGD);  Surgeon: [name], MD;  Location: [name];  Service: Endoscopy;  Laterality: N/A;   FOOT SPURS     HYSTEROSCOPY WITH D & C N/A 09/11/2012   Procedure: DILATATION AND CURETTAGE ;  Surgeon: [name], MD;  Location: [facility];  Service: Gynecology;  Laterality: N/A;   KNEE SURGERY  2007   LAPAROSCOPIC ASSISTED VAGINAL HYSTERECTOMY N/A 04/05/2016   Procedure: LAPAROSCOPIC ASSISTED VAGINAL HYSTERECTOMY possible BSO;  Surgeon: [name], MD;  Location: [facility];  Service: Gynecology;  Laterality: N/A;   LUMBAR LAMINECTOMY/DECOMPRESSION MICRODISCECTOMY N/A 10/20/2014   Procedure: MICRO LUMBER DECOMPRESSION L3-4 L4-5;  Surgeon: [name], MD;  Location: [facility];  Service: Orthopedics;  Laterality: N/A;   LUMBAR LAMINECTOMY/DECOMPRESSION MICRODISCECTOMY Bilateral 10/13/2015   Procedure: MICRO LUMBAR DECOMPRESSION L5 - S1 AND REDO DECOMPRESSION L4 - L5 AND REMOVAL OF FACET CYST L4 - L5 2 LEVELS;  Surgeon: [name], MD;  Location: [facility];  Service: Orthopedics;  Laterality: Bilateral;   PERIPHERAL VASCULAR INTERVENTION Right 04/29/2019   Procedure: PERIPHERAL VASCULAR INTERVENTION;  Surgeon: Marty Heck, MD;  Location: [facility];  Service: Cardiovascular;  Laterality: Right;   PORT-A-CATH REMOVAL  04/17/2011   Procedure: REMOVAL PORT-A-CATH;  Surgeon: [name], MD;  Location:  [facility];  Service: General;  Laterality: Left;   PORTACATH PLACEMENT  02/07/2011   Procedure: INSERTION PORT-A-CATH;  Surgeon: [name], MD;  Location: [facility];  Service: General;  Laterality: N/A;   RIGHT/LEFT HEART CATH AND CORONARY ANGIOGRAPHY N/A 03/17/2020   Procedure: RIGHT/LEFT HEART CATH AND CORONARY ANGIOGRAPHY;  Surgeon: Burnell Blanks, MD;  Location: [facility];  Service: Cardiovascular;  Laterality: N/A;   TEE WITHOUT CARDIOVERSION N/A 05/04/2020   Procedure: TRANSESOPHAGEAL ECHOCARDIOGRAM (TEE);  Surgeon: Rexene Alberts, MD;  Location: [facility];  Service: Open Heart Surgery;  Laterality: N/A;   UPPER GASTROINTESTINAL ENDOSCOPY     Family History  Problem Relation Age of Onset   Kidney disease Mother    Heart disease Mother    Throat cancer Father    Alcoholism Father    Heart attack Father    Lymphoma Brother 23   Heart attack Brother    Diabetes Brother    Hypertension Brother    Cancer Son    Colon cancer Neg Hx    Stroke Neg Hx    Esophageal cancer Neg Hx    Rectal cancer Neg Hx    Stomach cancer Neg Hx    Outpatient Medications Prior to Visit  Medication Sig Dispense  Refill   Accu-Chek FastClix Lancets MISC USE TO CHECK BLOOD SUGARS 3 TIMES DAILY 306 each 3   acetaminophen (TYLENOL) 650 MG CR tablet Take 650 mg by mouth every 8 (eight) hours as needed for pain.     amoxicillin-clavulanate (AUGMENTIN) 875-125 MG tablet Take 1 tablet by mouth 2 (two) times daily. 20 tablet 0   aspirin EC 81 MG tablet Take 81 mg by mouth at bedtime.     benzonatate (TESSALON) 200 MG capsule Take 1 capsule (200 mg total) by mouth 2 (two) times daily as needed for cough. 20 capsule 0   blood glucose meter kit and supplies Dispense based on patient and insurance preference. Use up to four times daily as directed. (FOR ICD-10 E10.9, E11.9). 1 each 0   Blood Glucose Monitoring Suppl DEVI by Does not apply route. Accu Check Guide     Cholecalciferol  (VITAMIN D3) 25 MCG (1000 UT) CAPS Take 2,000 Units by mouth daily before lunch.     empagliflozin (JARDIANCE) 10 MG TABS tablet Take 1 tablet (10 mg total) by mouth daily before breakfast. 30 tablet 2   Evolocumab (REPATHA SURECLICK) 349 MG/ML SOAJ Inject 1 pen into the skin every 14 (fourteen) days. 2 mL 11   furosemide (LASIX) 20 MG tablet Take 1 tablet (20 mg total) by mouth daily. 90 tablet 3   glipiZIDE (GLUCOTROL XL) 10 MG 24 hr tablet Take 1 tablet (10 mg total) by mouth daily with breakfast. 90 tablet 3   glucose blood (ACCU-CHEK GUIDE) test strip TEST ONCE DAILY 100 each 5   glucose blood test strip 1 each by Other route as needed for other. Use as instructed  Please dispense Accu check Guide Test strips     Golimumab (SIMPONI ARIA IV) Inject into the vein every 2 (two) months.     icosapent Ethyl (VASCEPA) 1 g capsule Take 2 capsules (2 g total) by mouth 2 (two) times daily. 120 capsule 11   levothyroxine (SYNTHROID) 100 MCG tablet Take 1 tablet (100 mcg total) by mouth daily. 30 tablet 3   losartan (COZAAR) 50 MG tablet Take 50 mg by mouth daily.     Magnesium 250 MG TABS Take 250 mg by mouth at bedtime.     metoprolol tartrate (LOPRESSOR) 25 MG tablet TAKE 1 TABLET(25 MG) BY MOUTH TWICE DAILY 180 tablet 3   pantoprazole (PROTONIX) 40 MG tablet Take 1 tablet (40 mg total) by mouth daily. 90 tablet 3   PARoxetine (PAXIL) 20 MG tablet Take 1 tablet (20 mg total) by mouth in the morning. 90 tablet 3   Semaglutide,0.25 or 0.5MG/DOS, (OZEMPIC, 0.25 OR 0.5 MG/DOSE,) 2 MG/1.5ML SOPN Inject 0.25 mg into the skin once a week. 1.5 mL 2   traZODone (DESYREL) 50 MG tablet Take 0.5 tablets (25 mg total) by mouth at bedtime as needed for sleep. 30 tablet 6   valACYclovir (VALTREX) 1000 MG tablet Take 1 tablet (1,000 mg total) by mouth daily as needed (Fever Blisters). 180 tablet 0   predniSONE (DELTASONE) 10 MG tablet Take 1 tablet (10 mg total) by mouth 2 (two) times daily with a meal for 5 days.  10 tablet 0   amitriptyline (ELAVIL) 10 MG tablet Take 1 tablet (10 mg total) by mouth at bedtime. (Patient not taking: Reported on 01/25/2021) 30 tablet 2   No facility-administered medications prior to visit.   Allergies  Allergen Reactions   Codeine Nausea And Vomiting and Other (See Comments)    Severe  stomach cramps   Antihistamines, Diphenhydramine-Type Other (See Comments)    Causes hyperactivity   Gabapentin     Urinary incontinence   Statins Other (See Comments)    Joint pains   Sulfa Antibiotics     Unknown to pt   Erythromycin Hives and Rash    Due to dental work about 50 years ago. Was used in packing and resulted in rash/hives inside and outside of mouth.   Trulicity [Dulaglutide] Itching   Objective:   Today's Vitals   01/25/21 1509  BP: 140/82  Pulse: 85  Temp: 97.6 F (36.4 C)  TempSrc: Temporal  SpO2: 96%  Weight: 144 lb 12.8 oz (65.7 kg)  Height: '5\' 3"'  (1.6 m)   Body mass index is 25.65 kg/m.   General: Well developed, well nourished. No acute distress. Lungs: Right basilar ronchi with bibasilar rales. Good air movement. CV: RRR without murmurs or rubs. Pulses 2+ bilaterally. Psych: Alert and oriented. Normal mood and affect.  Health Maintenance Due  Topic Date Due   Hepatitis C Screening  Never done   TETANUS/TDAP  Never done   Zoster Vaccines- Shingrix (2 of 2) 03/29/2011   FOOT EXAM  03/18/2019   Lab Results Last CBC Lab Results  Component Value Date   WBC 6.5 01/20/2021   HGB 14.5 01/20/2021   HCT 42.6 01/20/2021   MCV 90.8 01/20/2021   MCH 30.9 01/20/2021   RDW 13.7 01/20/2021   PLT 279 01/20/2021   Component Ref Range & Units 5 d ago   SARS-CoV-2, NAA Not Detected Not Detected   Influenza A, NAA Not Detected Detected Abnormal    Influenza B, NAA Not Detected Not Detected   RSV, NAA Not Detected Not Detected      Imaging: Chest x-ray (01/20/2021) IMPRESSION: 1. No active cardiopulmonary disease. 2.  Aortic Atherosclerosis  (ICD10-I70.0).  Assessment & Plan:   1. Influenza A The underlying illness appears to be influenza A. The chest x-ray had not shown pneumonia, but there are some persistent right lower ronchi. I do agree with the prescribing of Augmentin. I recommended Ms. Reddy complete this course. I will follow-up wioth her in   2. Gastroesophageal reflux disease without esophagitis Renew Protonix.  - pantoprazole (PROTONIX) 40 MG tablet; Take 1 tablet (40 mg total) by mouth daily.  Dispense: 90 tablet; Refill: 3  3. Recurrent major depressive disorder, in full remission (HCC) Renew Paxil  - PARoxetine (PAXIL) 20 MG tablet; Take 1 tablet (20 mg total) by mouth in the morning.  Dispense: 90 tablet; Refill: 3  [name], MD

## 2021-02-01 ENCOUNTER — Other Ambulatory Visit: Payer: Self-pay

## 2021-02-02 ENCOUNTER — Ambulatory Visit (INDEPENDENT_AMBULATORY_CARE_PROVIDER_SITE_OTHER): Payer: Medicare Other | Admitting: Family Medicine

## 2021-02-02 VITALS — BP 118/64 | HR 58 | Temp 97.0°F | Ht 63.0 in | Wt 147.4 lb

## 2021-02-02 DIAGNOSIS — E039 Hypothyroidism, unspecified: Secondary | ICD-10-CM

## 2021-02-02 DIAGNOSIS — I10 Essential (primary) hypertension: Secondary | ICD-10-CM | POA: Diagnosis not present

## 2021-02-02 DIAGNOSIS — E1142 Type 2 diabetes mellitus with diabetic polyneuropathy: Secondary | ICD-10-CM

## 2021-02-02 DIAGNOSIS — E1140 Type 2 diabetes mellitus with diabetic neuropathy, unspecified: Secondary | ICD-10-CM

## 2021-02-02 DIAGNOSIS — Z9889 Other specified postprocedural states: Secondary | ICD-10-CM

## 2021-02-02 LAB — TSH: TSH: 18.5 u[IU]/mL — ABNORMAL HIGH (ref 0.35–5.50)

## 2021-02-02 LAB — HEMOGLOBIN A1C: Hgb A1c MFr Bld: 10.5 % — ABNORMAL HIGH (ref 4.6–6.5)

## 2021-02-02 LAB — GLUCOSE, RANDOM: Glucose, Bld: 294 mg/dL — ABNORMAL HIGH (ref 70–99)

## 2021-02-02 MED ORDER — LEVOTHYROXINE SODIUM 125 MCG PO TABS: 125.0000 ug | ORAL_TABLET | Freq: Every day | ORAL | 3 refills | Status: DC

## 2021-02-02 MED ORDER — TRAMADOL HCL 50 MG PO TABS: 50.0000 mg | ORAL_TABLET | Freq: Every evening | ORAL | 0 refills | Status: AC | PRN

## 2021-02-02 MED ORDER — OZEMPIC (0.25 OR 0.5 MG/DOSE) 2 MG/1.5ML ~~LOC~~ SOPN: 0.5000 mg | PEN_INJECTOR | SUBCUTANEOUS | 6 refills | Status: DC

## 2021-02-02 NOTE — Addendum Note (Signed)
Addended by: [name] on: 02/02/2021 12:16 PM   Modules accepted: Orders

## 2021-02-02 NOTE — Progress Notes (Signed)
[facility] [address] [place] [address] Dept: [phone] Dept Fax: [phone]  Chronic Care Office Visit  Subjective:    Patient ID: [name], female    DOB: Mar 21, 1940, 81 y.o..   MRN: [id number]  Chief Complaint  Patient presents with   Follow-up    3 month f/u.  No concerns.   Fasting today.      History of Present Illness:  Patient is in today for reassessment of chronic medical issues.  Ms. Maciejewski has a past history of aortic stenosis and coronary artery disease. She had CABG x 2 and aortic valve replacement in 04/2020. She also has peripheral artery disease, having had two stents placed in the right leg. However, due to complications, the left leg was not able to be addressed. She also has hypertension and hyperlipidemia.  She does have claudication after walking for about 50 feet. She is managed on aspirin, furosemide, metoprolol, and losartan. She has been intolerant of statins and is on Repatha and Vascepa.   Ms. Simoni has a history of Type 2 diabetes. She is managed on glipizide, empagliflozin, and semaglutide. She had several changes to her meds since the Fall due to progressive chronic kidney disease and her heart issues. She also has neuropathy.   Ms. Kawasaki has a history of hypothyroidism. She is managed on levothyroxine. We increased her dose at her visit in Oct.   Ms. Sallis had a recent lower respiratory illness. She is still having some cough, but feels like she is much improved from before.   Ms. Venturella had a recent partial toe nail removal (a week ago). She notes some burnign pain of the toe that is interfering with sleep. Tylenol has not been managing this.  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   Osteoarthritis of knee 04/16/2012   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 female breast ([place]) 12/20/2010   History of breast cancer 12/05/2010   Depression 09/14/2008   GERD 09/14/2008   Type 2 diabetes mellitus with diabetic neuropathy, without long-term current use of insulin ([place]) 05/20/2008   Essential hypertension 04/17/2007   Coronary atherosclerosis 04/17/2007   Hypothyroidism 04/03/2007   Past Surgical History:  Procedure Laterality Date   ABDOMINAL AORTOGRAM W/LOWER EXTREMITY Bilateral 04/29/2019   Procedure: ABDOMINAL AORTOGRAM W/LOWER EXTREMITY;  Surgeon: Marty Heck, MD;  Location: [facility];  Service: Cardiovascular;  Laterality: Bilateral;   ABDOMINAL AORTOGRAM W/LOWER EXTREMITY Left 07/01/2019  Procedure: ABDOMINAL AORTOGRAM W/ Left LOWER EXTREMITY [place];  Surgeon: Marty Heck, MD;  Location: [facility];  Service: Cardiovascular;  Laterality: Left;   ANTERIOR AND POSTERIOR REPAIR N/A 04/05/2016   Procedure: ANTERIOR (CYSTOCELE) AND POSTERIOR REPAIR (RECTOCELE);  Surgeon: [name], MD;  Location: [facility];   Service: Gynecology;  Laterality: N/A;   AORTIC VALVE REPLACEMENT N/A 05/04/2020   Procedure: AORTIC VALVE REPLACEMENT (AVR) USING INSPIRIS 23MM RESILIA AORTIC VALVE;  Surgeon: Rexene Alberts, MD;  Location: [facility];  Service: Open Heart Surgery;  Laterality: N/A;   BREAST BIOPSY     BREAST LUMPECTOMY  1983   benign biopsy   BREAST LUMPECTOMY Right 12/29/2010   snbx, ER/PR +, Her2 -, 0/1 node pos.   CARPAL TUNNEL RELEASE Bilateral 9/12,8,12   CHOLECYSTECTOMY     COLONOSCOPY N/A 02/09/2013   Procedure: COLONOSCOPY;  Surgeon: [name], MD;  Location: [facility];  Service: Endoscopy;  Laterality: N/A;   COLONOSCOPY     CORONARY ANGIOPLASTY     CORONARY ARTERY BYPASS GRAFT N/A 05/04/2020   Procedure: CORONARY ARTERY BYPASS GRAFTING (CABG), ON PUMP, TIMES TWO, USING LEFT INTERNAL MAMMARY ARTERY AND RIGHT ENDOSCOPICALLY HARVESTED GREATER SAPHENOUS VEIN;  Surgeon: Rexene Alberts, MD;  Location: [facility];  Service: Open Heart Surgery;  Laterality: N/A;   DILATION AND CURETTAGE OF UTERUS     ESOPHAGOGASTRODUODENOSCOPY N/A 02/09/2013   Procedure: ESOPHAGOGASTRODUODENOSCOPY (EGD);  Surgeon: [name], MD;  Location: [name];  Service: Endoscopy;  Laterality: N/A;   FOOT SPURS     HYSTEROSCOPY WITH D & C N/A 09/11/2012   Procedure: DILATATION AND CURETTAGE ;  Surgeon: [name], MD;  Location: [facility];  Service: Gynecology;  Laterality: N/A;   KNEE SURGERY  2007   LAPAROSCOPIC ASSISTED VAGINAL HYSTERECTOMY N/A 04/05/2016   Procedure: LAPAROSCOPIC ASSISTED VAGINAL HYSTERECTOMY possible BSO;  Surgeon: [name], MD;  Location: [facility];  Service: Gynecology;  Laterality: N/A;   LUMBAR LAMINECTOMY/DECOMPRESSION MICRODISCECTOMY N/A 10/20/2014   Procedure: MICRO LUMBER DECOMPRESSION L3-4 L4-5;  Surgeon: [name], MD;  Location: [facility];  Service: Orthopedics;  Laterality: N/A;   LUMBAR LAMINECTOMY/DECOMPRESSION MICRODISCECTOMY Bilateral 10/13/2015   Procedure: MICRO LUMBAR DECOMPRESSION  L5 - S1 AND REDO DECOMPRESSION L4 - L5 AND REMOVAL OF FACET CYST L4 - L5 2 LEVELS;  Surgeon: [name], MD;  Location: [facility];  Service: Orthopedics;  Laterality: Bilateral;   PERIPHERAL VASCULAR INTERVENTION Right 04/29/2019   Procedure: PERIPHERAL VASCULAR INTERVENTION;  Surgeon: Marty Heck, MD;  Location: [facility];  Service: Cardiovascular;  Laterality: Right;   PORT-A-CATH REMOVAL  04/17/2011   Procedure: REMOVAL PORT-A-CATH;  Surgeon: [name], MD;  Location: [facility];  Service: General;  Laterality: Left;   PORTACATH PLACEMENT  02/07/2011   Procedure: INSERTION PORT-A-CATH;  Surgeon: [name], MD;  Location: [facility];  Service: General;  Laterality: N/A;   RIGHT/LEFT HEART CATH AND CORONARY ANGIOGRAPHY N/A 03/17/2020   Procedure: RIGHT/LEFT HEART CATH AND CORONARY ANGIOGRAPHY;  Surgeon: Burnell Blanks, MD;  Location: [facility];  Service: Cardiovascular;  Laterality: N/A;   TEE WITHOUT CARDIOVERSION N/A 05/04/2020   Procedure: TRANSESOPHAGEAL ECHOCARDIOGRAM (TEE);  Surgeon: Rexene Alberts, MD;  Location: [facility];  Service: Open Heart Surgery;  Laterality: N/A;   UPPER GASTROINTESTINAL ENDOSCOPY     Family History  Problem Relation Age of Onset   Kidney disease Mother    Heart disease Mother    Throat  cancer Father    Alcoholism Father    Heart attack Father    Lymphoma Brother 45   Heart attack Brother    Diabetes Brother    Hypertension Brother    Cancer Son    Colon cancer Neg Hx    Stroke Neg Hx    Esophageal cancer Neg Hx    Rectal cancer Neg Hx    Stomach cancer Neg Hx    Outpatient Medications Prior to Visit  Medication Sig Dispense Refill   Accu-Chek FastClix Lancets MISC USE TO CHECK BLOOD SUGARS 3 TIMES DAILY 306 each 3   acetaminophen (TYLENOL) 650 MG CR tablet Take 650 mg by mouth every 8 (eight) hours as needed for pain.     aspirin EC 81 MG tablet Take 81 mg by mouth at bedtime.     blood  glucose meter kit and supplies Dispense based on patient and insurance preference. Use up to four times daily as directed. (FOR ICD-10 E10.9, E11.9). 1 each 0   Blood Glucose Monitoring Suppl DEVI by Does not apply route. Accu Check Guide     Cholecalciferol (VITAMIN D3) 25 MCG (1000 UT) CAPS Take 2,000 Units by mouth daily before lunch.     empagliflozin (JARDIANCE) 10 MG TABS tablet Take 1 tablet (10 mg total) by mouth daily before breakfast. 30 tablet 2   Evolocumab (REPATHA SURECLICK) 800 MG/ML SOAJ Inject 1 pen into the skin every 14 (fourteen) days. 2 mL 11   furosemide (LASIX) 20 MG tablet Take 1 tablet (20 mg total) by mouth daily. 90 tablet 3   glipiZIDE (GLUCOTROL XL) 10 MG 24 hr tablet Take 1 tablet (10 mg total) by mouth daily with breakfast. 90 tablet 3   glucose blood (ACCU-CHEK GUIDE) test strip TEST ONCE DAILY 100 each 5   glucose blood test strip 1 each by Other route as needed for other. Use as instructed  Please dispense Accu check Guide Test strips     Golimumab (SIMPONI ARIA IV) Inject into the vein every 2 (two) months.     icosapent Ethyl (VASCEPA) 1 g capsule Take 2 capsules (2 g total) by mouth 2 (two) times daily. 120 capsule 11   levothyroxine (SYNTHROID) 100 MCG tablet Take 1 tablet (100 mcg total) by mouth daily. 30 tablet 3   losartan (COZAAR) 50 MG tablet Take 50 mg by mouth daily.     Magnesium 250 MG TABS Take 250 mg by mouth at bedtime.     metoprolol tartrate (LOPRESSOR) 25 MG tablet TAKE 1 TABLET(25 MG) BY MOUTH TWICE DAILY 180 tablet 3   pantoprazole (PROTONIX) 40 MG tablet Take 1 tablet (40 mg total) by mouth daily. 90 tablet 3   PARoxetine (PAXIL) 20 MG tablet Take 1 tablet (20 mg total) by mouth in the morning. 90 tablet 3   Semaglutide,0.25 or 0.5MG/DOS, (OZEMPIC, 0.25 OR 0.5 MG/DOSE,) 2 MG/1.5ML SOPN Inject 0.25 mg into the skin once a week. 1.5 mL 2   traZODone (DESYREL) 50 MG tablet Take 0.5 tablets (25 mg total) by mouth at bedtime as needed for sleep.  30 tablet 6   valACYclovir (VALTREX) 1000 MG tablet Take 1 tablet (1,000 mg total) by mouth daily as needed (Fever Blisters). 180 tablet 0   amoxicillin-clavulanate (AUGMENTIN) 875-125 MG tablet Take 1 tablet by mouth 2 (two) times daily. 20 tablet 0   benzonatate (TESSALON) 200 MG capsule Take 1 capsule (200 mg total) by mouth 2 (two) times daily as needed for cough.  20 capsule 0   amitriptyline (ELAVIL) 10 MG tablet Take 1 tablet (10 mg total) by mouth at bedtime. (Patient not taking: Reported on 01/25/2021) 30 tablet 2   No facility-administered medications prior to visit.   Allergies  Allergen Reactions   Codeine Nausea And Vomiting and Other (See Comments)    Severe stomach cramps   Antihistamines, Diphenhydramine-Type Other (See Comments)    Causes hyperactivity   Gabapentin     Urinary incontinence   Statins Other (See Comments)    Joint pains   Sulfa Antibiotics     Unknown to pt   Erythromycin Hives and Rash    Due to dental work about 50 years ago. Was used in packing and resulted in rash/hives inside and outside of mouth.   Trulicity [Dulaglutide] Itching   Objective:   Today's Vitals   02/02/21 0758  BP: 118/64  Pulse: (!) 58  Temp: (!) 97 F (36.1 C)  TempSrc: Temporal  SpO2: 96%  Weight: 147 lb 6.4 oz (66.9 kg)  Height: '5\' 3"'  (1.6 m)   Body mass index is 26.11 kg/m.   General: Well developed, well nourished. No acute distress. Lungs: Mild scattered rales with a few rhonchi in the left base. Feet- Skin intact. No sign of maceration between toes. Nails are normal. Left great nail is post-surgical from   recent lateral partial nail removals. Dorsalis pedis and posterior tibial artery pulses are normal. 5.07  monofilament testing shows some area of insensate, L>R. Psych: Alert and oriented x3. Normal mood and affect.  Health Maintenance Due  Topic Date Due   Hepatitis C Screening  Never done   TETANUS/TDAP  Never done   Zoster Vaccines- Shingrix (2 of 2)  03/29/2011   FOOT EXAM  03/18/2019   Lab Results Last lipids Lab Results  Component Value Date   CHOL 258 (H) 11/02/2020   HDL 42.70 11/02/2020   LDLCALC 83 05/13/2008   LDLDIRECT 172.0 11/02/2020   TRIG 214.0 (H) 11/02/2020   CHOLHDL 6 11/02/2020   Last hemoglobin A1c Lab Results  Component Value Date   HGBA1C 7.2 (H) 11/02/2020   Last thyroid functions Lab Results  Component Value Date   TSH 13.43 (H) 11/02/2020     Assessment & Plan:   1. Type 2 diabetes mellitus with diabetic neuropathy, without long-term current use of insulin ([place]) Due for A1c today. Cotninue glipizide, semaglutide, and empagliflozin.  - Glucose, random - Hemoglobin A1c  2. Diabetic peripheral neuropathy (H[facility]) Stable. Will continue to focus on optimal blood sugar control.  3. Essential hypertension Blood pressure at goal. Continue losartan and metoprolol.  4. Acquired hypothyroidism Increased dose of levothyroxine in the Fall. We will reassess her TSH today. - TSH  5. Status post surgical removal of nail matrix of toe of left foot I will provide a short course of tramadol for pain. Recommend she take this at bedtime to improve sleep.  - traMADol (ULTRAM) 50 MG tablet; Take 1 tablet (50 mg total) by mouth at bedtime as needed for up to 14 days.  Dispense: 14 tablet; Refill: 0  [name], MD

## 2021-02-04 ENCOUNTER — Other Ambulatory Visit: Payer: Self-pay | Admitting: Family Medicine

## 2021-02-04 DIAGNOSIS — E1140 Type 2 diabetes mellitus with diabetic neuropathy, unspecified: Secondary | ICD-10-CM

## 2021-02-05 ENCOUNTER — Emergency Department (HOSPITAL_BASED_OUTPATIENT_CLINIC_OR_DEPARTMENT_OTHER): Payer: Medicare Other

## 2021-02-05 ENCOUNTER — Inpatient Hospital Stay (HOSPITAL_BASED_OUTPATIENT_CLINIC_OR_DEPARTMENT_OTHER)
Admission: EM | Admit: 2021-02-05 | Discharge: 2021-02-07 | DRG: 153 | Disposition: A | Discharge status: Home / Self Care | Payer: Medicare Other | Attending: Internal Medicine | Admitting: Internal Medicine

## 2021-02-05 ENCOUNTER — Other Ambulatory Visit: Payer: Self-pay

## 2021-02-05 ENCOUNTER — Encounter (HOSPITAL_BASED_OUTPATIENT_CLINIC_OR_DEPARTMENT_OTHER): Payer: Self-pay | Admitting: Emergency Medicine

## 2021-02-05 DIAGNOSIS — E039 Hypothyroidism, unspecified: Secondary | ICD-10-CM | POA: Diagnosis present

## 2021-02-05 DIAGNOSIS — Z882 Allergy status to sulfonamides status: Secondary | ICD-10-CM | POA: Diagnosis not present

## 2021-02-05 DIAGNOSIS — J0430 Supraglottitis, unspecified, without obstruction: Secondary | ICD-10-CM | POA: Diagnosis not present

## 2021-02-05 DIAGNOSIS — I2510 Atherosclerotic heart disease of native coronary artery without angina pectoris: Secondary | ICD-10-CM | POA: Diagnosis present

## 2021-02-05 DIAGNOSIS — E1122 Type 2 diabetes mellitus with diabetic chronic kidney disease: Secondary | ICD-10-CM | POA: Diagnosis present

## 2021-02-05 DIAGNOSIS — J101 Influenza due to other identified influenza virus with other respiratory manifestations: Secondary | ICD-10-CM | POA: Diagnosis present

## 2021-02-05 DIAGNOSIS — M069 Rheumatoid arthritis, unspecified: Secondary | ICD-10-CM | POA: Diagnosis present

## 2021-02-05 DIAGNOSIS — Z7982 Long term (current) use of aspirin: Secondary | ICD-10-CM

## 2021-02-05 DIAGNOSIS — J0510 Acute epiglottitis without obstruction: Principal | ICD-10-CM | POA: Diagnosis present

## 2021-02-05 DIAGNOSIS — Z923 Personal history of irradiation: Secondary | ICD-10-CM

## 2021-02-05 DIAGNOSIS — Z79890 Hormone replacement therapy (postmenopausal): Secondary | ICD-10-CM

## 2021-02-05 DIAGNOSIS — Z833 Family history of diabetes mellitus: Secondary | ICD-10-CM

## 2021-02-05 DIAGNOSIS — Z79899 Other long term (current) drug therapy: Secondary | ICD-10-CM

## 2021-02-05 DIAGNOSIS — Z6372 Alcoholism and drug addiction in family: Secondary | ICD-10-CM | POA: Diagnosis not present

## 2021-02-05 DIAGNOSIS — Z20822 Contact with and (suspected) exposure to covid-19: Secondary | ICD-10-CM | POA: Diagnosis present

## 2021-02-05 DIAGNOSIS — Z87891 Personal history of nicotine dependence: Secondary | ICD-10-CM

## 2021-02-05 DIAGNOSIS — Z881 Allergy status to other antibiotic agents status: Secondary | ICD-10-CM

## 2021-02-05 DIAGNOSIS — Z885 Allergy status to narcotic agent status: Secondary | ICD-10-CM

## 2021-02-05 DIAGNOSIS — Z888 Allergy status to other drugs, medicaments and biological substances status: Secondary | ICD-10-CM

## 2021-02-05 DIAGNOSIS — E871 Hypo-osmolality and hyponatremia: Secondary | ICD-10-CM | POA: Diagnosis present

## 2021-02-05 DIAGNOSIS — Z853 Personal history of malignant neoplasm of breast: Secondary | ICD-10-CM

## 2021-02-05 DIAGNOSIS — Z9861 Coronary angioplasty status: Secondary | ICD-10-CM

## 2021-02-05 DIAGNOSIS — Z811 Family history of alcohol abuse and dependence: Secondary | ICD-10-CM

## 2021-02-05 DIAGNOSIS — Z953 Presence of xenogenic heart valve: Secondary | ICD-10-CM | POA: Diagnosis not present

## 2021-02-05 DIAGNOSIS — Z808 Family history of malignant neoplasm of other organs or systems: Secondary | ICD-10-CM

## 2021-02-05 DIAGNOSIS — Z841 Family history of disorders of kidney and ureter: Secondary | ICD-10-CM

## 2021-02-05 DIAGNOSIS — E1140 Type 2 diabetes mellitus with diabetic neuropathy, unspecified: Secondary | ICD-10-CM | POA: Diagnosis present

## 2021-02-05 DIAGNOSIS — Z951 Presence of aortocoronary bypass graft: Secondary | ICD-10-CM

## 2021-02-05 DIAGNOSIS — I10 Essential (primary) hypertension: Secondary | ICD-10-CM | POA: Diagnosis present

## 2021-02-05 DIAGNOSIS — E7800 Pure hypercholesterolemia, unspecified: Secondary | ICD-10-CM | POA: Diagnosis present

## 2021-02-05 DIAGNOSIS — Z7984 Long term (current) use of oral hypoglycemic drugs: Secondary | ICD-10-CM

## 2021-02-05 DIAGNOSIS — E1142 Type 2 diabetes mellitus with diabetic polyneuropathy: Secondary | ICD-10-CM

## 2021-02-05 DIAGNOSIS — N184 Chronic kidney disease, stage 4 (severe): Secondary | ICD-10-CM | POA: Diagnosis present

## 2021-02-05 DIAGNOSIS — K219 Gastro-esophageal reflux disease without esophagitis: Secondary | ICD-10-CM

## 2021-02-05 DIAGNOSIS — Z9221 Personal history of antineoplastic chemotherapy: Secondary | ICD-10-CM

## 2021-02-05 DIAGNOSIS — Z807 Family history of other malignant neoplasms of lymphoid, hematopoietic and related tissues: Secondary | ICD-10-CM

## 2021-02-05 DIAGNOSIS — I129 Hypertensive chronic kidney disease with stage 1 through stage 4 chronic kidney disease, or unspecified chronic kidney disease: Secondary | ICD-10-CM | POA: Diagnosis present

## 2021-02-05 DIAGNOSIS — Z794 Long term (current) use of insulin: Secondary | ICD-10-CM | POA: Diagnosis present

## 2021-02-05 DIAGNOSIS — E1165 Type 2 diabetes mellitus with hyperglycemia: Secondary | ICD-10-CM | POA: Diagnosis present

## 2021-02-05 DIAGNOSIS — Z8249 Family history of ischemic heart disease and other diseases of the circulatory system: Secondary | ICD-10-CM

## 2021-02-05 LAB — CBC WITH DIFFERENTIAL/PLATELET
Abs Immature Granulocytes: 0.06 10*3/uL (ref 0.00–0.07)
Basophils Absolute: 0.1 10*3/uL (ref 0.0–0.1)
Basophils Relative: 0 %
Eosinophils Absolute: 0.2 10*3/uL (ref 0.0–0.5)
Eosinophils Relative: 1 %
HCT: 41 % (ref 36.0–46.0)
Hemoglobin: 13.8 g/dL (ref 12.0–15.0)
Immature Granulocytes: 0 %
Lymphocytes Relative: 18 %
Lymphs Abs: 2.4 10*3/uL (ref 0.7–4.0)
MCH: 29.6 pg (ref 26.0–34.0)
MCHC: 33.7 g/dL (ref 30.0–36.0)
MCV: 88 fL (ref 80.0–100.0)
Monocytes Absolute: 1.6 10*3/uL — ABNORMAL HIGH (ref 0.1–1.0)
Monocytes Relative: 12 %
Neutro Abs: 9.2 10*3/uL — ABNORMAL HIGH (ref 1.7–7.7)
Neutrophils Relative %: 69 %
Platelets: 213 10*3/uL (ref 150–400)
RBC: 4.66 MIL/uL (ref 3.87–5.11)
RDW: 14.3 % (ref 11.5–15.5)
WBC: 13.4 10*3/uL — ABNORMAL HIGH (ref 4.0–10.5)
nRBC: 0 % (ref 0.0–0.2)

## 2021-02-05 LAB — GROUP A STREP BY PCR: Group A Strep by PCR: NOT DETECTED

## 2021-02-05 LAB — BASIC METABOLIC PANEL
Anion gap: 11 (ref 5–15)
BUN: 23 mg/dL (ref 8–23)
CO2: 24 mmol/L (ref 22–32)
Calcium: 9.1 mg/dL (ref 8.9–10.3)
Chloride: 99 mmol/L (ref 98–111)
Creatinine, Ser: 1.69 mg/dL — ABNORMAL HIGH (ref 0.44–1.00)
GFR, Estimated: 31 mL/min — ABNORMAL LOW (ref >60)
Glucose, Bld: 270 mg/dL — ABNORMAL HIGH (ref 70–99)
Potassium: 4.3 mmol/L (ref 3.5–5.1)
Sodium: 134 mmol/L — ABNORMAL LOW (ref 135–145)

## 2021-02-05 LAB — GLUCOSE, CAPILLARY
Glucose-Capillary: 442 mg/dL — ABNORMAL HIGH (ref 70–99)
Glucose-Capillary: 378 mg/dL — ABNORMAL HIGH (ref 70–99)
Glucose-Capillary: 379 mg/dL — ABNORMAL HIGH (ref 70–99)
Glucose-Capillary: 493 mg/dL — ABNORMAL HIGH (ref 70–99)

## 2021-02-05 LAB — RESP PANEL BY RT-PCR (FLU A&B, COVID) ARPGX2
Influenza A by PCR: POSITIVE — AB
Influenza B by PCR: NEGATIVE
SARS Coronavirus 2 by RT PCR: NEGATIVE

## 2021-02-05 LAB — PROCALCITONIN: Procalcitonin: <0.1 ng/mL

## 2021-02-05 LAB — MAGNESIUM: Magnesium: 1.6 mg/dL — ABNORMAL LOW (ref 1.7–2.4)

## 2021-02-05 LAB — PHOSPHORUS: Phosphorus: 3.4 mg/dL (ref 2.5–4.6)

## 2021-02-05 MED ORDER — ACETAMINOPHEN 650 MG RE SUPP: 650.0000 mg | Freq: Four times a day (QID) | RECTAL | Status: DC | PRN

## 2021-02-05 MED ORDER — DEXAMETHASONE SODIUM PHOSPHATE 4 MG/ML IJ SOLN
4.0000 mg | Freq: Two times a day (BID) | INTRAMUSCULAR | Status: DC
  Administered 2021-02-05 – 2021-02-07 (×4): 4 mg via INTRAVENOUS
  Filled 2021-02-05 (×5): qty 1

## 2021-02-05 MED ORDER — INSULIN ASPART 100 UNIT/ML IJ SOLN: 0.0000 [IU] | INTRAMUSCULAR | Status: DC

## 2021-02-05 MED ORDER — LABETALOL HCL 5 MG/ML IV SOLN
5.0000 mg | INTRAVENOUS | Status: DC | PRN
  Administered 2021-02-06 – 2021-02-07 (×3): 5 mg via INTRAVENOUS
  Filled 2021-02-05 (×3): qty 4

## 2021-02-05 MED ORDER — PANTOPRAZOLE SODIUM 40 MG IV SOLR
40.0000 mg | INTRAVENOUS | Status: DC
  Administered 2021-02-05: 40 mg via INTRAVENOUS
  Filled 2021-02-05: qty 40

## 2021-02-05 MED ORDER — INSULIN ASPART 100 UNIT/ML IJ SOLN
11.0000 [IU] | Freq: Once | INTRAMUSCULAR | Status: AC
  Administered 2021-02-05: 11 [IU] via SUBCUTANEOUS

## 2021-02-05 MED ORDER — SODIUM CHLORIDE 0.9 % IV SOLN: 2.0000 g | INTRAVENOUS | Status: DC

## 2021-02-05 MED ORDER — INSULIN ASPART 100 UNIT/ML IJ SOLN: 0.0000 [IU] | Freq: Three times a day (TID) | INTRAMUSCULAR | Status: DC

## 2021-02-05 MED ORDER — SODIUM CHLORIDE 0.9 % IV SOLN
1.0000 g | INTRAVENOUS | Status: AC
  Administered 2021-02-05: 1 g via INTRAVENOUS
  Filled 2021-02-05: qty 10

## 2021-02-05 MED ORDER — ACETAMINOPHEN 325 MG PO TABS: 650.0000 mg | ORAL_TABLET | Freq: Four times a day (QID) | ORAL | Status: DC | PRN

## 2021-02-05 MED ORDER — SODIUM CHLORIDE 0.9 % IV BOLUS
500.0000 mL | Freq: Once | INTRAVENOUS | Status: AC
  Administered 2021-02-05: 500 mL via INTRAVENOUS

## 2021-02-05 MED ORDER — PHENOL 1.4 % MT LIQD
1.0000 | OROMUCOSAL | Status: DC | PRN
  Administered 2021-02-05: 1 via OROMUCOSAL
  Filled 2021-02-05: qty 177

## 2021-02-05 MED ORDER — VANCOMYCIN HCL IN DEXTROSE 1-5 GM/200ML-% IV SOLN
1000.0000 mg | INTRAVENOUS | Status: DC
  Administered 2021-02-07: 1000 mg via INTRAVENOUS
  Filled 2021-02-05: qty 200

## 2021-02-05 MED ORDER — VANCOMYCIN HCL IN DEXTROSE 1-5 GM/200ML-% IV SOLN
1000.0000 mg | Freq: Once | INTRAVENOUS | Status: AC
  Administered 2021-02-05: 1000 mg via INTRAVENOUS
  Filled 2021-02-05: qty 200

## 2021-02-05 MED ORDER — SODIUM CHLORIDE 0.9 % IV SOLN
2.0000 g | INTRAVENOUS | Status: DC
  Administered 2021-02-06 – 2021-02-07 (×2): 2 g via INTRAVENOUS
  Filled 2021-02-05 (×2): qty 20

## 2021-02-05 MED ORDER — CEFTRIAXONE SODIUM 1 G IJ SOLR
1.0000 g | Freq: Once | INTRAMUSCULAR | Status: AC
  Administered 2021-02-05: 1 g via INTRAVENOUS
  Filled 2021-02-05: qty 10

## 2021-02-05 MED ORDER — INSULIN ASPART 100 UNIT/ML IJ SOLN
0.0000 [IU] | Freq: Three times a day (TID) | INTRAMUSCULAR | Status: DC
  Administered 2021-02-05 – 2021-02-06 (×2): 9 [IU] via SUBCUTANEOUS
  Administered 2021-02-06: 7 [IU] via SUBCUTANEOUS

## 2021-02-05 MED ORDER — CLINDAMYCIN PHOSPHATE 600 MG/50ML IV SOLN
600.0000 mg | Freq: Once | INTRAVENOUS | Status: DC
  Administered 2021-02-05: 600 mg via INTRAVENOUS
  Filled 2021-02-05: qty 50

## 2021-02-05 MED ORDER — DEXAMETHASONE SODIUM PHOSPHATE 10 MG/ML IJ SOLN
10.0000 mg | Freq: Once | INTRAMUSCULAR | Status: AC
  Administered 2021-02-05: 10 mg via INTRAVENOUS
  Filled 2021-02-05: qty 1

## 2021-02-05 MED ORDER — LACTATED RINGERS IV SOLN: INTRAVENOUS | Status: AC

## 2021-02-05 MED ORDER — INSULIN ASPART 100 UNIT/ML IJ SOLN
4.0000 [IU] | Freq: Once | INTRAMUSCULAR | Status: AC
  Administered 2021-02-05: 4 [IU] via SUBCUTANEOUS

## 2021-02-05 MED ORDER — MENTHOL 3 MG MT LOZG: 1.0000 | LOZENGE | OROMUCOSAL | Status: DC | PRN

## 2021-02-05 NOTE — ED Notes (Signed)
ED Provider at bedside. 

## 2021-02-05 NOTE — H&P (Signed)
History and Physical    [name] DQQ:[id number] DOB: 1940-04-17 DOA: 02/05/2021  PCP: [name], MD  Patient coming from: Home.  Chief Complaint: Sore throat difficulty swallowing.  HPI: [name] is a 81 y.o. female with history of CAD status post CABG and aortic valve replacement, diabetes mellitus type 2, history of breast cancer, chronic kidney disease stage IV hypertension presents to the ER after patient has been having sore throat like symptoms with difficulty swallowing for the last 2 days.  Patient states she was treated for upper respiratory tract infection about a week ago with antibiotics.  At that time patient also was influenza positive.  She did well after the treatment.  But last 24 hours symptoms got worse with pain and sore throat like symptoms.  Denies any difficulty breathing.  Has increased secretion which she finds it difficult to swallow.  ED Course: In the ER patient was not hypoxic and no obvious stridor.  CT scan shows features concerning for supraglottitis/epiglottitis and patient was started on Decadron empiric antibiotics after blood cultures obtained.  On exam patient does have whitish patches on both tonsils.  No restriction of movement of neck.  Labs show leukocytosis and influenza A was positive.  Dr. Janace Hoard of ENT surgery has been consulted patient admitted for further work-up.  Review of Systems: As per HPI, rest all negative.   Past Medical History:  Diagnosis Date   Allergy    Anemia    childhood   Anxiety    Aortic stenosis    Arterial stenosis (HCC)    mesenteric   Arthritis    Breast cancer ([place]) 12/05/10   R breast, inv mammary, in situ,ER/PR +,HER2 -   Cancer ([place])    Carcinoma of breast treated with adjuvant chemotherapy ([place])    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[place])    Claudication ([place])    Coronary artery disease    stent 2007   Depression    Diabetes mellitus    Difficulty sleeping    Diverticulosis  12/10/03   Elevated cholesterol    Generalized weakness    GERD (gastroesophageal reflux disease)    Heart murmur    Hepatitis    as an infant   HSV-1 (herpes simplex virus 1) infection    Hyperlipidemia    Hypertension    Hypothyroidism    Osteoarthritis    PAD (peripheral artery disease) ([place])    Personal history of chemotherapy    Personal history of radiation therapy    Pneumonia    2014 september and March 2022   Rheumatoid arthritis ([place])    S/P aortic valve replacement with bioprosthetic valve 05/04/2020   [facility] Inspiris Resilia stented bovine pericardial tissue valve, size 23 mm   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   LIMA to D3, SVG to PDA, EVH via right thigh   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   Dr [name]   Spinal stenosis    Thyroid disease     Past Surgical History:  Procedure Laterality Date   ABDOMINAL AORTOGRAM W/LOWER EXTREMITY Bilateral 04/29/2019   Procedure: ABDOMINAL AORTOGRAM W/LOWER EXTREMITY;  Surgeon: Marty Heck, MD;  Location: [facility];  Service: Cardiovascular;  Laterality: Bilateral;   ABDOMINAL AORTOGRAM W/LOWER EXTREMITY Left 07/01/2019   Procedure: ABDOMINAL AORTOGRAM W/ Left LOWER EXTREMITY [place];  Surgeon: Marty Heck, MD;  Location: [facility];  Service: Cardiovascular;  Laterality: Left;   ANTERIOR AND  POSTERIOR REPAIR N/A 04/05/2016   Procedure: ANTERIOR (CYSTOCELE) AND POSTERIOR REPAIR (RECTOCELE);  Surgeon: [name], MD;  Location: [facility];  Service: Gynecology;  Laterality: N/A;   AORTIC VALVE REPLACEMENT N/A 05/04/2020   Procedure: AORTIC VALVE REPLACEMENT (AVR) USING INSPIRIS 23MM RESILIA AORTIC VALVE;  Surgeon: Rexene Alberts, MD;  Location: [facility];  Service: Open Heart Surgery;  Laterality: N/A;   BREAST BIOPSY     BREAST LUMPECTOMY  1983   benign biopsy   BREAST LUMPECTOMY Right 12/29/2010   snbx, ER/PR +, Her2 -, 0/1 node pos.   CARPAL TUNNEL RELEASE Bilateral 9/12,8,12   CHOLECYSTECTOMY      COLONOSCOPY N/A 02/09/2013   Procedure: COLONOSCOPY;  Surgeon: [name], MD;  Location: [facility];  Service: Endoscopy;  Laterality: N/A;   COLONOSCOPY     CORONARY ANGIOPLASTY     CORONARY ARTERY BYPASS GRAFT N/A 05/04/2020   Procedure: CORONARY ARTERY BYPASS GRAFTING (CABG), ON PUMP, TIMES TWO, USING LEFT INTERNAL MAMMARY ARTERY AND RIGHT ENDOSCOPICALLY HARVESTED GREATER SAPHENOUS VEIN;  Surgeon: Rexene Alberts, MD;  Location: [facility];  Service: Open Heart Surgery;  Laterality: N/A;   DILATION AND CURETTAGE OF UTERUS     ESOPHAGOGASTRODUODENOSCOPY N/A 02/09/2013   Procedure: ESOPHAGOGASTRODUODENOSCOPY (EGD);  Surgeon: [name], MD;  Location: [name];  Service: Endoscopy;  Laterality: N/A;   FOOT SPURS     HYSTEROSCOPY WITH D & C N/A 09/11/2012   Procedure: DILATATION AND CURETTAGE ;  Surgeon: [name], MD;  Location: [facility];  Service: Gynecology;  Laterality: N/A;   KNEE SURGERY  2007   LAPAROSCOPIC ASSISTED VAGINAL HYSTERECTOMY N/A 04/05/2016   Procedure: LAPAROSCOPIC ASSISTED VAGINAL HYSTERECTOMY possible BSO;  Surgeon: [name], MD;  Location: [facility];  Service: Gynecology;  Laterality: N/A;   LUMBAR LAMINECTOMY/DECOMPRESSION MICRODISCECTOMY N/A 10/20/2014   Procedure: MICRO LUMBER DECOMPRESSION L3-4 L4-5;  Surgeon: [name], MD;  [facility]: [facility];  Service: Orthopedics;  Laterality: N/A;   LUMBAR LAMINECTOMY/DECOMPRESSION MICRODISCECTOMY Bilateral 10/13/2015   Procedure: MICRO LUMBAR DECOMPRESSION L5 - S1 AND REDO DECOMPRESSION L4 - L5 AND REMOVAL OF FACET CYST L4 - L5 2 LEVELS;  Surgeon: [name], MD;  Location: [facility];  Service: Orthopedics;  Laterality: Bilateral;   PERIPHERAL VASCULAR INTERVENTION Right 04/29/2019   Procedure: PERIPHERAL VASCULAR INTERVENTION;  Surgeon: Marty Heck, MD;  Location: [facility];  Service: Cardiovascular;  Laterality: Right;   PORT-A-CATH REMOVAL  04/17/2011   Procedure: REMOVAL PORT-A-CATH;  Surgeon:  [name], MD;  Location: [facility];  Service: General;  Laterality: Left;   PORTACATH PLACEMENT  02/07/2011   Procedure: INSERTION PORT-A-CATH;  Surgeon: [name], MD;  Location: [facility];  Service: General;  Laterality: N/A;   RIGHT/LEFT HEART CATH AND CORONARY ANGIOGRAPHY N/A 03/17/2020   Procedure: RIGHT/LEFT HEART CATH AND CORONARY ANGIOGRAPHY;  Surgeon: Burnell Blanks, MD;  Location: [facility];  Service: Cardiovascular;  Laterality: N/A;   TEE WITHOUT CARDIOVERSION N/A 05/04/2020   Procedure: TRANSESOPHAGEAL ECHOCARDIOGRAM (TEE);  Surgeon: Rexene Alberts, MD;  Location: [facility];  Service: Open Heart Surgery;  Laterality: N/A;   UPPER GASTROINTESTINAL ENDOSCOPY       reports that she quit smoking about 23 years ago. Her smoking use included cigarettes. She has a 80.00 pack-year smoking history. She has never used smokeless tobacco. She reports current alcohol use of about 1.0 standard drink per week. She reports that she does not use drugs.  Allergies  Allergen  Reactions   Codeine Nausea And Vomiting and Other (See Comments)    Severe stomach cramps   Antihistamines, Diphenhydramine-Type Other (See Comments)    Causes hyperactivity   Gabapentin     Urinary incontinence   Statins Other (See Comments)    Joint pains   Sulfa Antibiotics Other (See Comments)    Reaction not recalled   Erythromycin Hives, Rash and Other (See Comments)    Allergic due to dental work from 50 years ago. It was used in packing and resulted in rash/hives inside and outside of mouth.   Trulicity [Dulaglutide] Itching    Family History  Problem Relation Age of Onset   Kidney disease Mother    Heart disease Mother    Throat cancer Father    Alcoholism Father    Heart attack Father    Lymphoma Brother 62   Heart attack Brother    Diabetes Brother    Hypertension Brother    Cancer Son    Colon cancer Neg Hx    Stroke Neg Hx    Esophageal cancer Neg Hx     Rectal cancer Neg Hx    Stomach cancer Neg Hx     Prior to Admission medications   Medication Sig Start Date End Date Taking? Authorizing Provider  Accu-Chek FastClix Lancets MISC USE TO CHECK BLOOD SUGARS 3 TIMES DAILY 11/03/19   Cirigliano, Garvin Fila, DO  acetaminophen (TYLENOL) 500 MG tablet     [provider]  acetaminophen (TYLENOL) 650 MG CR tablet Take 650 mg by mouth every 8 (eight) hours as needed for pain.    [provider]  amitriptyline (ELAVIL) 10 MG tablet Take 1 tablet (10 mg total) by mouth at bedtime. 12/19/20   [name], MD  aspirin (ASPIRIN 81) 81 MG EC tablet     [provider]  aspirin EC 81 MG tablet Take 81 mg by mouth at bedtime.    [provider]  blood glucose meter kit and supplies Dispense based on patient and insurance preference. Use up to four times daily as directed. (FOR ICD-10 E10.9, E11.9). 11/02/20   [name], MD  Blood Glucose Monitoring Suppl DEVI by Does not apply route. Accu Check Guide    [provider]  Cholecalciferol (VITAMIN D3) 25 MCG (1000 UT) CAPS Take 2,000 Units by mouth daily before lunch.    [provider]  empagliflozin (JARDIANCE) 10 MG TABS tablet Take 1 tablet (10 mg total) by mouth daily before breakfast. 11/02/20   [name], MD  Evolocumab (REPATHA SURECLICK) 194 MG/ML SOAJ Inject 1 pen into the skin every 14 (fourteen) days. 11/18/20   Josue Hector, MD  furosemide (LASIX) 20 MG tablet Take 1 tablet (20 mg total) by mouth daily. 09/05/20   Josue Hector, MD  gabapentin (NEURONTIN) 100 MG capsule     [provider]  glipiZIDE (GLUCOTROL XL) 10 MG 24 hr tablet Take 1 tablet (10 mg total) by mouth daily with breakfast. 12/19/20   [name], MD  glucose blood (ACCU-CHEK GUIDE) test strip TEST ONCE DAILY 11/04/20   [name], MD  glucose blood test strip 1 each by Other route as needed for other. Use as instructed  Please dispense Accu  check Guide Test strips    [provider]  Golimumab ([place] ARIA IV) Inject into the vein every 2 (two) months.    [provider]  icosapent Ethyl (VASCEPA) 1 g capsule Take  2 capsules (2 g total) by mouth 2 (two) times daily. 11/18/20   Josue Hector, MD  levothyroxine (SYNTHROID) 125 MCG tablet Take 1 tablet (125 mcg total) by mouth daily. 02/02/21   [name], MD  losartan (COZAAR) 50 MG tablet Take 50 mg by mouth daily.    [provider]  Magnesium 250 MG TABS Take 250 mg by mouth at bedtime.    [provider]  metoprolol tartrate (LOPRESSOR) 25 MG tablet TAKE 1 TABLET(25 MG) BY MOUTH TWICE DAILY 07/26/20   Josue Hector, MD  omega-3 fish oil (MAXEPA) 1000 MG CAPS capsule     [provider]  pantoprazole (PROTONIX) 40 MG tablet Take 1 tablet (40 mg total) by mouth daily. 01/25/21   [name], MD  PARoxetine (PAXIL) 20 MG tablet Take 1 tablet (20 mg total) by mouth in the morning. 01/25/21   [name], MD  Semaglutide,0.25 or 0.5MG/DOS, (OZEMPIC, 0.25 OR 0.5 MG/DOSE,) 2 MG/1.5ML SOPN Inject 0.5 mg into the skin once a week. 02/02/21   [name], MD  traMADol (ULTRAM) 50 MG tablet Take 1 tablet (50 mg total) by mouth at bedtime as needed for up to 14 days. 02/02/21 02/16/21  [name], MD  traZODone (DESYREL) 50 MG tablet Take 0.5 tablets (25 mg total) by mouth at bedtime as needed for sleep. 11/02/20   [name], MD  valACYclovir (VALTREX) 1000 MG tablet Take 1 tablet (1,000 mg total) by mouth daily as needed (Fever Blisters). 05/20/20   Cirigliano, Garvin Fila, DO  vitamin B-12 (CYANOCOBALAMIN) 500 MCG tablet     [provider]    Physical Exam: Constitutional: Moderately built and nourished. Vitals:   02/05/21 1230 02/05/21 1300 02/05/21 1330 02/05/21 1452  BP: (!) 169/97 (!) 179/86 (!) 190/97 (!) 156/75  Pulse: 93 92 95 99  Resp: 19 15 (!) 22 18  Temp:    (!) 97.5 F (36.4 C)  TempSrc:    Oral   SpO2: 95% 93% 94% 97%  Weight:      Height:       Eyes: Anicteric no pallor. ENMT: No discharge from the ears eyes nose but does see some whitish patches on both tonsils. Neck: No obvious stridor. Respiratory: No rhonchi or crepitations. Cardiovascular: S1-S2 heard. Abdomen: Soft nontender bowel sound present. Musculoskeletal: No edema.  There is chronic changes changes on the left heel. Skin: Chronic skin changes on the left heel. Neurologic: Alert awake oriented time place and person.  Moves all extremities. Psychiatric: Appears normal.  Normal affect.   Labs on Admission: I have personally reviewed following labs and imaging studies  CBC: Recent Labs  Lab 02/05/21 0803  WBC 13.4*  NEUTROABS 9.2*  HGB 13.8  HCT 41.0  MCV 88.0  PLT 254   Basic Metabolic Panel: Recent Labs  Lab 02/02/21 0826 02/05/21 0803  NA  --  134*  K  --  4.3  CL  --  99  CO2  --  24  GLUCOSE 294* 270*  BUN  --  23  CREATININE  --  1.69*  CALCIUM  --  9.1  MG  --  1.6*  PHOS  --  3.4   GFR: Estimated Creatinine Clearance: 25 mL/min (A) (by C-G formula based on SCr of 1.69 mg/dL (H)). Liver Function Tests: No results for input(s): AST, ALT, ALKPHOS, BILITOT, PROT, ALBUMIN in the last 168 hours. No results for input(s): LIPASE, AMYLASE in the  last 168 hours. No results for input(s): AMMONIA in the last 168 hours. Coagulation Profile: No results for input(s): INR, PROTIME in the last 168 hours. Cardiac Enzymes: No results for input(s): CKTOTAL, CKMB, CKMBINDEX, TROPONINI in the last 168 hours. BNP (last 3 results) Recent Labs    08/24/20 1005 09/01/20 1023 10/26/20 1137  PROBNP 2,246* 2,950* 2,308*   HbA1C: No results for input(s): HGBA1C in the last 72 hours. CBG: Recent Labs  Lab 02/05/21 1501 02/05/21 1637  GLUCAP 442* 378*   Lipid Profile: No results for input(s): CHOL, HDL, LDLCALC, TRIG, CHOLHDL, LDLDIRECT in the last 72 hours. Thyroid Function Tests: No results for  input(s): TSH, T4TOTAL, FREET4, T3FREE, THYROIDAB in the last 72 hours. Anemia Panel: No results for input(s): VITAMINB12, FOLATE, FERRITIN, TIBC, IRON, RETICCTPCT in the last 72 hours. Urine analysis:    Component Value Date/Time   COLORURINE YELLOW 11/02/2020 0940   APPEARANCEUR CLEAR 11/02/2020 0940   LABSPEC 1.010 11/02/2020 0940   PHURINE 6.0 11/02/2020 0940   GLUCOSEU NEGATIVE 11/02/2020 0940   HGBUR NEGATIVE 11/02/2020 0940   BILIRUBINUR NEGATIVE 11/02/2020 0940   BILIRUBINUR negative 10/19/2020 1549   KETONESUR NEGATIVE 11/02/2020 0940   PROTEINUR Positive (A) 10/19/2020 1549   PROTEINUR NEGATIVE 05/02/2020 1230   UROBILINOGEN 0.2 11/02/2020 0940   NITRITE NEGATIVE 11/02/2020 0940   LEUKOCYTESUR SMALL (A) 11/02/2020 0940   Sepsis Labs: _0 (procalcitonin:4,lacticidven:4) ) Recent Results (from the past 240 hour(s))  Group A Strep by PCR     Status: None   Collection Time: 02/05/21  7:38 AM   Specimen: Throat; Sterile Swab  Result Value Ref Range Status   Group A Strep by PCR NOT DETECTED NOT DETECTED Final    Comment: Performed at [facility], [address] [place], [place] [address]  Resp Panel by RT-PCR (Flu A&B, Covid) Nasopharyngeal Swab     Status: Abnormal   Collection Time: 02/05/21  7:38 AM   Specimen: Nasopharyngeal Swab; Nasopharyngeal(NP) swabs in vial transport medium  Result Value Ref Range Status   SARS Coronavirus 2 by RT PCR NEGATIVE NEGATIVE Final    Comment: (NOTE) SARS-CoV-2 target nucleic acids are NOT DETECTED.  The SARS-CoV-2 RNA is generally detectable in upper respiratory specimens during the acute phase of infection. The lowest concentration of SARS-CoV-2 viral copies this assay can detect is 138 copies/mL. A negative result does not preclude SARS-Cov-2 infection and should not be used as the sole basis for treatment or other patient management decisions. A negative result may occur with  improper specimen  collection/handling, submission of specimen other than nasopharyngeal swab, presence of viral mutation(s) within the areas targeted by this assay, and inadequate number of viral copies(<138 copies/mL). A negative result must be combined with clinical observations, patient history, and epidemiological information. The expected result is Negative.  Fact Sheet for Patients:  EntrepreneurPulse.com.au  Fact Sheet for Healthcare Providers:  IncredibleEmployment.be  This test is no t yet approved or cleared by the [place] FDA and  has been authorized for detection and/or diagnosis of SARS-CoV-2 by FDA under an Emergency Use Authorization (EUA). This EUA will remain  in effect (meaning this test can be used) for the duration of the COVID-19 declaration under Section 564(b)(1) of the Act, 21 U.S.C.section 360bbb-3(b)(1), unless the authorization is terminated  or revoked sooner.       Influenza A by PCR POSITIVE (A) NEGATIVE Final   Influenza B by PCR NEGATIVE NEGATIVE Final    Comment: (NOTE) The  Xpert Xpress SARS-CoV-2/FLU/RSV plus assay is intended as an aid in the diagnosis of influenza from Nasopharyngeal swab specimens and should not be used as a sole basis for treatment. Nasal washings and aspirates are unacceptable for Xpert Xpress SARS-CoV-2/FLU/RSV testing.  Fact Sheet for Patients: EntrepreneurPulse.com.au  Fact Sheet for Healthcare Providers: IncredibleEmployment.be  This test is not yet approved or cleared by the [place] FDA and has been authorized for detection and/or diagnosis of SARS-CoV-2 by FDA under an Emergency Use Authorization (EUA). This EUA will remain in effect (meaning this test can be used) for the duration of the COVID-19 declaration under Section 564(b)(1) of the Act, 21 U.S.C. section 360bbb-3(b)(1), unless the authorization is terminated or revoked.  Performed at [facility], [address] [place], [place] [address]      Radiological Exams on Admission: CT SOFT TISSUE NECK WO CONTRAST  Result Date: 02/05/2021 CLINICAL DATA:  Epiglottitis or tonsillitis suspected. Sore throat with difficulty swallowing for 2 days EXAM: CT NECK WITHOUT CONTRAST TECHNIQUE: Multidetector CT imaging of the neck was performed following the standard protocol without intravenous contrast. RADIATION DOSE REDUCTION: This exam was performed according to the departmental dose-optimization program which includes automated exposure control, adjustment of the mA and/or kV according to patient size and/or use of iterative reconstruction technique. COMPARISON:  None. FINDINGS: Pharynx and larynx: Low-density thickening of the epiglottis and aryepiglottic folds. Negative tonsils. No soft tissue gas in the neck.The glottis is closed at time of imaging; otherwise patent airway with some luminal narrowing due to the swollen supraglottic structures. 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 Upper chest: Negative IMPRESSION: Confirmed supraglottitis/epiglottitis with swelling crowding the airway. Electronically Signed   By: Jorje Guild M.D.   On: 02/05/2021 09:37     Assessment/Plan Principal Problem:   Acute supraglottitis with epiglottitis Active Problems:   Hypothyroidism   Type 2 diabetes mellitus with diabetic neuropathy, without long-term current use of insulin (HCC)   Essential hypertension   Epiglottitis    Acute supraglottitis/epiglottitis for which ENT surgeon Dr. Janace Hoard has been consulted.  Patient received empiric antibiotics and Decadron.  We will keep patient  n.p.o. until seen by ENT surgeon.  Closely monitor respiratory status. Diabetes mellitus type 2 with hyperglycemia we will keep patient on CBG every 4 hourly with sliding scale coverage.  Last hemoglobin A1c about 3 days ago was 10.5.  Patient likely may need long-acting insulin. Chronic kidney disease stage IV -creatinine appears to be at baseline.  Follow metabolic panel. Hypertension we will keep patient n.p.o. and IV labetalol since patient is n.p.o. History of CAD status post CABG and bioprosthetic aortic valve replacement recently.  No acute issues we will closely monitor. Low magnesium levels will replace and recheck. Recently diagnosed with influenza A.  Since patient has acute epiglottitis/supraglottitis with need to closely monitor respiratory status will need inpatient status.   DVT prophylaxis: SCDs.  Avoiding anticoagulation in anticipation if patient needs procedure. Code Status: Full code. Family Communication: Discussed with patient. Disposition Plan: Home. Consults called: ENT Surgeon Dr. Janace Hoard. Admission status: Inpatient.   Rise Patience MD [facility] Pager [phone].  If 7PM-7AM, please contact night-coverage [email or web address] Password [facility]  02/05/2021, 4:39 PM

## 2021-02-05 NOTE — ED Triage Notes (Signed)
Pt arrives pov with c/o sore throat, difficulty swallowing secretions x 2 days

## 2021-02-05 NOTE — ED Notes (Signed)
Call placed to 4E report given to Cyril Mourning, RN  receiving RN busy at this time.

## 2021-02-05 NOTE — ED Provider Notes (Signed)
[facility]PART[facility] Provider Note   CSN: [id number] Arrival date & time: 02/05/21  7829     History  Chief Complaint  Patient presents with   Sore Throat    [name] is a 81 y.o. female.  Patient is a 81 year old female who presents with sore throat.  She said it started yesterday and got worse today.  She is having difficulty swallowing.  She has a slight change in her voice.  She recently had the flu for the last 2 weeks and was feeling much better from that and then this started.  She has a little congestion in her throat but no nasal congestion.  No significant coughing.  No known fevers.  No rashes.      Home Medications Prior to Admission medications   Medication Sig Start Date End Date Taking? Authorizing Provider  Accu-Chek FastClix Lancets MISC USE TO CHECK BLOOD SUGARS 3 TIMES DAILY 11/03/19   Cirigliano, Garvin Fila, DO  acetaminophen (TYLENOL) 650 MG CR tablet Take 650 mg by mouth every 8 (eight) hours as needed for pain.    [provider]  amitriptyline (ELAVIL) 10 MG tablet Take 1 tablet (10 mg total) by mouth at bedtime. Patient not taking: Reported on 01/25/2021 12/19/20   [name], MD  aspirin EC 81 MG tablet Take 81 mg by mouth at bedtime.    [provider]  blood glucose meter kit and supplies Dispense based on patient and insurance preference. Use up to four times daily as directed. (FOR ICD-10 E10.9, E11.9). 11/02/20   [name], MD  Blood Glucose Monitoring Suppl DEVI by Does not apply route. Accu Check Guide    [provider]  Cholecalciferol (VITAMIN D3) 25 MCG (1000 UT) CAPS Take 2,000 Units by mouth daily before lunch.    [provider]  empagliflozin (JARDIANCE) 10 MG TABS tablet Take 1 tablet (10 mg total) by mouth daily before breakfast. 11/02/20   [name], MD  Evolocumab (REPATHA SURECLICK) 562 MG/ML SOAJ Inject 1 pen into the skin every 14 (fourteen) days. 11/18/20    Josue Hector, MD  furosemide (LASIX) 20 MG tablet Take 1 tablet (20 mg total) by mouth daily. 09/05/20   Josue Hector, MD  glipiZIDE (GLUCOTROL XL) 10 MG 24 hr tablet Take 1 tablet (10 mg total) by mouth daily with breakfast. 12/19/20   [name], MD  glucose blood (ACCU-CHEK GUIDE) test strip TEST ONCE DAILY 11/04/20   [name], MD  glucose blood test strip 1 each by Other route as needed for other. Use as instructed  Please dispense Accu check Guide Test strips    [provider]  Golimumab (Nash ARIA IV) Inject into the vein every 2 (two) months.    [provider]  icosapent Ethyl (VASCEPA) 1 g capsule Take 2 capsules (2 g total) by mouth 2 (two) times daily. 11/18/20   Josue Hector, MD  levothyroxine (SYNTHROID) 125 MCG tablet Take 1 tablet (125 mcg total) by mouth daily. 02/02/21   [name], MD  losartan (COZAAR) 50 MG tablet Take 50 mg by mouth daily.    [provider]  Magnesium 250 MG TABS Take 250 mg by mouth at bedtime.    [provider]  metoprolol tartrate (LOPRESSOR) 25 MG tablet TAKE 1 TABLET(25 MG) BY MOUTH TWICE DAILY 07/26/20   Josue Hector, MD  pantoprazole (PROTONIX) 40 MG tablet Take 1 tablet (  40 mg total) by mouth daily. 01/25/21   [name], MD  PARoxetine (PAXIL) 20 MG tablet Take 1 tablet (20 mg total) by mouth in the morning. 01/25/21   [name], MD  Semaglutide,0.25 or 0.5MG/DOS, (OZEMPIC, 0.25 OR 0.5 MG/DOSE,) 2 MG/1.5ML SOPN Inject 0.5 mg into the skin once a week. 02/02/21   [name], MD  traMADol (ULTRAM) 50 MG tablet Take 1 tablet (50 mg total) by mouth at bedtime as needed for up to 14 days. 02/02/21 02/16/21  [name], MD  traZODone (DESYREL) 50 MG tablet Take 0.5 tablets (25 mg total) by mouth at bedtime as needed for sleep. 11/02/20   [name], MD  valACYclovir (VALTREX) 1000 MG tablet Take 1 tablet (1,000 mg total) by mouth daily as needed (Fever Blisters). 05/20/20    Ronnald Nian, DO      Allergies    Codeine; Antihistamines, diphenhydramine-type; Gabapentin; Statins; Sulfa antibiotics; Erythromycin; and Trulicity [dulaglutide]    Review of Systems   Review of Systems  Constitutional:  Negative for chills, diaphoresis, fatigue and fever.  HENT:  Positive for sore throat, trouble swallowing and voice change. Negative for congestion, rhinorrhea and sneezing.   Eyes: Negative.   Respiratory:  Negative for cough, chest tightness and shortness of breath.   Cardiovascular:  Negative for chest pain and leg swelling.  Gastrointestinal:  Negative for abdominal pain, blood in stool, diarrhea, nausea and vomiting.  Genitourinary:  Negative for difficulty urinating, flank pain, frequency and hematuria.  Musculoskeletal:  Negative for arthralgias and back pain.  Skin:  Negative for rash.  Neurological:  Negative for dizziness, speech difficulty, weakness, numbness and headaches.   Physical Exam Updated Vital Signs BP (!) 154/89    Pulse 92    Temp 98 F (36.7 C) (Oral)    Resp 18    Ht '5\' 3"'  (1.6 m)    Wt 65.8 kg    SpO2 97%    BMI 25.69 kg/m  Physical Exam Constitutional:      Appearance: She is well-developed.  HENT:     Head: Normocephalic and atraumatic.     Nose: No congestion.     Mouth/Throat:     Comments: Swelling of the tonsils bilaterally with increased swelling and fullness to the left peritonsillar area.  There is some white patches on the tonsils bilaterally.  No trismus. Eyes:     Pupils: Pupils are equal, round, and reactive to light.  Cardiovascular:     Rate and Rhythm: Normal rate and regular rhythm.     Heart sounds: Normal heart sounds.  Pulmonary:     Effort: Pulmonary effort is normal. No respiratory distress.     Breath sounds: Normal breath sounds. No wheezing or rales.  Chest:     Chest wall: No tenderness.  Abdominal:     General: Bowel sounds are normal.     Palpations: Abdomen is soft.     Tenderness: There is no  abdominal tenderness. There is no guarding or rebound.  Musculoskeletal:        General: Normal range of motion.     Cervical back: Normal range of motion and neck supple.  Lymphadenopathy:     Cervical: No cervical adenopathy.  Skin:    General: Skin is warm and dry.     Findings: No rash.  Neurological:     Mental Status: She is alert and oriented to person, place, and time.    ED Results /  Procedures / Treatments   Labs (all labs ordered are listed, but only abnormal results are displayed) Labs Reviewed  RESP PANEL BY RT-PCR (FLU A&B, COVID) ARPGX2 - Abnormal; Notable for the following components:      Result Value   Influenza A by PCR POSITIVE (*)    All other components within normal limits  BASIC METABOLIC PANEL - Abnormal; Notable for the following components:   Sodium 134 (*)    Glucose, Bld 270 (*)    Creatinine, Ser 1.69 (*)    GFR, Estimated 31 (*)    All other components within normal limits  CBC WITH DIFFERENTIAL/PLATELET - Abnormal; Notable for the following components:   WBC 13.4 (*)    Neutro Abs 9.2 (*)    Monocytes Absolute 1.6 (*)    All other components within normal limits  MAGNESIUM - Abnormal; Notable for the following components:   Magnesium 1.6 (*)    All other components within normal limits  GROUP A STREP BY PCR  CULTURE, BLOOD (ROUTINE X 2)  CULTURE, BLOOD (ROUTINE X 2)  PHOSPHORUS  PROCALCITONIN    EKG None  Radiology CT SOFT TISSUE NECK WO CONTRAST  Result Date: 02/05/2021 CLINICAL DATA:  Epiglottitis or tonsillitis suspected. Sore throat with difficulty swallowing for 2 days EXAM: CT NECK WITHOUT CONTRAST TECHNIQUE: Multidetector CT imaging of the neck was performed following the standard protocol without intravenous contrast. RADIATION DOSE REDUCTION: This exam was performed according to the departmental dose-optimization program which includes automated exposure control, adjustment of the mA and/or kV according to patient size and/or  use of iterative reconstruction technique. COMPARISON:  None. FINDINGS: Pharynx and larynx: Low-density thickening of the epiglottis and aryepiglottic folds. Negative tonsils. No soft tissue gas in the neck.The glottis is closed at time of imaging; otherwise patent airway with some luminal narrowing due to the swollen supraglottic structures. 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 Upper chest: Negative IMPRESSION: Confirmed supraglottitis/epiglottitis with swelling crowding the airway. Electronically Signed   By: Jorje Guild M.D.   On: 02/05/2021 09:37    Procedures Procedures    Medications Ordered in ED Medications  dexamethasone (DECADRON) injection 10 mg (10 mg Intravenous Given 02/05/21 0901)  cefTRIAXone (ROCEPHIN) 1 g in sodium chloride 0.9 % 100 mL IVPB (0 g Intravenous Stopped 02/05/21 1156)  vancomycin (VANCOCIN) IVPB 1000 mg/200 mL premix (0 mg Intravenous Stopped 02/05/21 1152)  sodium chloride 0.9 % bolus 500 mL (500 mLs Intravenous New Bag/Given 02/05/21 1042)    ED Course/ Medical Decision Making/ A&P                           Medical Decision Making Amount and/or Complexity of Data Reviewed External Data Reviewed: labs and notes. Labs: ordered. Decision-making details documented in ED Course. Radiology: ordered and independent interpretation performed.  Risk Drug therapy requiring intensive monitoring for toxicity. Decision regarding hospitalization.  Critical Care Total time providing critical care: 30-74 minutes  Patient is a 81 year old female who presents with a sore throat and difficulty swallowing.  She does not report any shortness of breath.  She had what  appeared to be some peritonsillar fullness on exam and a CT was performed.  This shows evidence of epiglottitis with some  narrowing of the airway.  She previously had been ordered Decadron and clindamycin for presumed peritonsillar abscess.  However given this diagnosis, this was changed to vancomycin and Rocephin.  Her labs are reviewed.  Her white count is elevated.  Her creatinine is elevated but on chart review, it appears to be similar to prior values.  I spoke with Dr. Janace Hoard with ENT who will see the patient but request that he be called once the patient gets to the admitting facility.  I spoke with Dr. Olevia Bowens who has accepted the patient for transfer/admission.  Patient does not have any imminent need for airway management.  There is no stridor.  She is able to control her secretions.  Does not have any sensation of shortness of breath currently.  However I feel that she does need to be transferred more urgently.  There was some reluctance by the hospital staff to do an ED to ED transfer due to the number of patients that are currently boarding in the ED and waiting for beds.  They were able to find this a stepdown bed within a reasonable amount of time and patient was transferred by [facility] to [facility].  She was reevaluated several times with no worsening symptoms.  Final Clinical Impression(s) / ED Diagnoses Final diagnoses:  Epiglottitis    Rx / DC Orders ED Discharge Orders     None         Malvin Johns, MD 02/05/21 1216

## 2021-02-05 NOTE — ED Notes (Signed)
Report to [facility]; ETA 58min

## 2021-02-05 NOTE — Consult Note (Signed)
Reason for Consult:sore throat Referring Physician: hospital  [name] is an 81 y.o. female.  HPI: hx of flu 2 weeks ago then better. 2 days ago started with sore throat again and could not swallow today. No breathing issues. No voice change. 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[place])    Claudication ([place])    Coronary artery disease    stent 2007   Depression    Diabetes mellitus    Difficulty sleeping    Diverticulosis 12/10/03   Elevated cholesterol    Generalized weakness    GERD (gastroesophageal reflux disease)    Heart murmur    Hepatitis    as an infant   HSV-1 (herpes simplex virus 1) infection    Hyperlipidemia    Hypertension    Hypothyroidism    Osteoarthritis    PAD (peripheral artery disease) ([place])    Personal history of chemotherapy    Personal history of radiation therapy    Pneumonia    2014 september and March 2022   Rheumatoid arthritis ([place])    S/P aortic valve replacement with bioprosthetic valve 05/04/2020   [facility] Inspiris Resilia stented bovine pericardial tissue valve, size 23 mm   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   LIMA to D3, SVG to PDA, EVH via right thigh   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   Dr [name]   Spinal stenosis    Thyroid disease     Past Surgical History:  Procedure Laterality Date   ABDOMINAL AORTOGRAM W/LOWER EXTREMITY Bilateral 04/29/2019   Procedure: ABDOMINAL AORTOGRAM W/LOWER EXTREMITY;  Surgeon: Marty Heck, MD;  Location: [facility];  Service: Cardiovascular;  Laterality: Bilateral;   ABDOMINAL AORTOGRAM W/LOWER EXTREMITY Left 07/01/2019   Procedure:  ABDOMINAL AORTOGRAM W/ Left LOWER EXTREMITY [place];  Surgeon: Marty Heck, MD;  Location: [facility];  Service: Cardiovascular;  Laterality: Left;   ANTERIOR AND POSTERIOR REPAIR N/A 04/05/2016   Procedure: ANTERIOR (CYSTOCELE) AND POSTERIOR REPAIR (RECTOCELE);  Surgeon: [name], MD;  Location: [facility];  Service: Gynecology;  Laterality: N/A;   AORTIC VALVE REPLACEMENT N/A 05/04/2020   Procedure: AORTIC VALVE REPLACEMENT (AVR) USING INSPIRIS 23MM RESILIA AORTIC VALVE;  Surgeon: Rexene Alberts, MD;  Location: [facility];  Service: Open Heart Surgery;  Laterality: N/A;   BREAST BIOPSY     BREAST LUMPECTOMY  1983   benign biopsy   BREAST LUMPECTOMY Right 12/29/2010   snbx, ER/PR +, Her2 -, 0/1 node pos.   CARPAL TUNNEL RELEASE Bilateral 9/12,8,12   CHOLECYSTECTOMY     COLONOSCOPY N/A 02/09/2013   Procedure: COLONOSCOPY;  Surgeon: [name], MD;  [facility]: [facility];  Service: Endoscopy;  Laterality: N/A;   COLONOSCOPY     CORONARY ANGIOPLASTY     CORONARY ARTERY BYPASS GRAFT N/A 05/04/2020   Procedure: CORONARY ARTERY BYPASS GRAFTING (CABG), ON PUMP, TIMES TWO, USING LEFT INTERNAL MAMMARY ARTERY AND RIGHT ENDOSCOPICALLY HARVESTED GREATER SAPHENOUS VEIN;  Surgeon: Rexene Alberts, MD;  Location: [facility];  Service:  Open Heart Surgery;  Laterality: N/A;   DILATION AND CURETTAGE OF UTERUS     ESOPHAGOGASTRODUODENOSCOPY N/A 02/09/2013   Procedure: ESOPHAGOGASTRODUODENOSCOPY (EGD);  Surgeon: [name], MD;  Location: [name];  Service: Endoscopy;  Laterality: N/A;   FOOT SPURS     HYSTEROSCOPY WITH D & C N/A 09/11/2012   Procedure: DILATATION AND CURETTAGE ;  Surgeon: [name], MD;  Location: [facility];  Service: Gynecology;  Laterality: N/A;   KNEE SURGERY  2007   LAPAROSCOPIC ASSISTED VAGINAL HYSTERECTOMY N/A 04/05/2016   Procedure: LAPAROSCOPIC ASSISTED VAGINAL HYSTERECTOMY possible BSO;  Surgeon: [name], MD;  Location: [facility];  Service: Gynecology;   Laterality: N/A;   LUMBAR LAMINECTOMY/DECOMPRESSION MICRODISCECTOMY N/A 10/20/2014   Procedure: MICRO LUMBER DECOMPRESSION L3-4 L4-5;  Surgeon: [name], MD;  [facility];  Service: Orthopedics;  Laterality: N/A;   LUMBAR LAMINECTOMY/DECOMPRESSION MICRODISCECTOMY Bilateral 10/13/2015   Procedure: MICRO LUMBAR DECOMPRESSION L5 - S1 AND REDO DECOMPRESSION L4 - L5 AND REMOVAL OF FACET CYST L4 - L5 2 LEVELS;  Surgeon: [name], MD;  [facility];  Service: Orthopedics;  Laterality: Bilateral;   PERIPHERAL VASCULAR INTERVENTION Right 04/29/2019   Procedure: PERIPHERAL VASCULAR INTERVENTION;  Surgeon: Marty Heck, MD;  Location: [facility];  Service: Cardiovascular;  Laterality: Right;   PORT-A-CATH REMOVAL  04/17/2011   Procedure: REMOVAL PORT-A-CATH;  Surgeon: [name], MD;  Location: [facility];  Service: General;  Laterality: Left;   PORTACATH PLACEMENT  02/07/2011   Procedure: INSERTION PORT-A-CATH;  Surgeon: [name], MD;  [facility];  Service: General;  Laterality: N/A;   RIGHT/LEFT HEART CATH AND CORONARY ANGIOGRAPHY N/A 03/17/2020   Procedure: RIGHT/LEFT HEART CATH AND CORONARY ANGIOGRAPHY;  Surgeon: Burnell Blanks, MD;  Location: [facility];  Service: Cardiovascular;  Laterality: N/A;   TEE WITHOUT CARDIOVERSION N/A 05/04/2020   Procedure: TRANSESOPHAGEAL ECHOCARDIOGRAM (TEE);  Surgeon: Rexene Alberts, MD;  Location: [facility];  Service: Open Heart Surgery;  Laterality: N/A;   UPPER GASTROINTESTINAL ENDOSCOPY      Family History  Problem Relation Age of Onset   Kidney disease Mother    Heart disease Mother    Throat cancer Father    Alcoholism Father    Heart attack Father    Lymphoma Brother 54   Heart attack Brother    Diabetes Brother    Hypertension Brother    Cancer Son    Colon cancer Neg Hx    Stroke Neg Hx    Esophageal cancer Neg Hx    Rectal cancer Neg Hx    Stomach cancer Neg Hx      Social History:  reports that she quit smoking about 23 years ago. Her smoking use included cigarettes. She has a 80.00 pack-year smoking history. She has never used smokeless tobacco. She reports current alcohol use of about 1.0 standard drink per week. She reports that she does not use drugs.  Allergies:  Allergies  Allergen Reactions   Codeine Nausea And Vomiting and Other (See Comments)    Severe stomach cramps   Antihistamines, Diphenhydramine-Type Other (See Comments)    Causes hyperactivity   Gabapentin     Urinary incontinence   Statins Other (See Comments)    Joint pains   Sulfa Antibiotics Other (See Comments)    Reaction not recalled   Erythromycin Hives, Rash and Other (See Comments)    Allergic due to dental work from 50 years ago. It was used  in packing and resulted in rash/hives inside and outside of mouth.   Trulicity [Dulaglutide] Itching    Medications: I have reviewed the patient's current medications.  Results for orders placed or performed during the hospital encounter of 02/05/21 (from the past 48 hour(s))  Group A Strep by PCR     Status: None   Collection Time: 02/05/21  7:38 AM   Specimen: Throat; Sterile Swab  Result Value Ref Range   Group A Strep by PCR NOT DETECTED NOT DETECTED    Comment: Performed at [facility], [address] [place], [place] [address]  Resp Panel by RT-PCR (Flu A&B, Covid) Nasopharyngeal Swab     Status: Abnormal   Collection Time: 02/05/21  7:38 AM   Specimen: Nasopharyngeal Swab; Nasopharyngeal(NP) swabs in vial transport medium  Result Value Ref Range   SARS Coronavirus 2 by RT PCR NEGATIVE NEGATIVE    Comment: (NOTE) SARS-CoV-2 target nucleic acids are NOT DETECTED.  The SARS-CoV-2 RNA is generally detectable in upper respiratory specimens during the acute phase of infection. The lowest concentration of SARS-CoV-2 viral copies this assay can detect is 138 copies/mL. A negative result does not preclude  SARS-Cov-2 infection and should not be used as the sole basis for treatment or other patient management decisions. A negative result may occur with  improper specimen collection/handling, submission of specimen other than nasopharyngeal swab, presence of viral mutation(s) within the areas targeted by this assay, and inadequate number of viral copies(<138 copies/mL). A negative result must be combined with clinical observations, patient history, and epidemiological information. The expected result is Negative.  Fact Sheet for Patients:  EntrepreneurPulse.com.au  Fact Sheet for Healthcare Providers:  IncredibleEmployment.be  This test is no t yet approved or cleared by the [place] FDA and  has been authorized for detection and/or diagnosis of SARS-CoV-2 by FDA under an Emergency Use Authorization (EUA). This EUA will remain  in effect (meaning this test can be used) for the duration of the COVID-19 declaration under Section 564(b)(1) of the Act, 21 U.S.C.section 360bbb-3(b)(1), unless the authorization is terminated  or revoked sooner.       Influenza A by PCR POSITIVE (A) NEGATIVE   Influenza B by PCR NEGATIVE NEGATIVE    Comment: (NOTE) The Xpert Xpress SARS-CoV-2/FLU/RSV plus assay is intended as an aid in the diagnosis of influenza from Nasopharyngeal swab specimens and should not be used as a sole basis for treatment. Nasal washings and aspirates are unacceptable for Xpert Xpress SARS-CoV-2/FLU/RSV testing.  Fact Sheet for Patients: EntrepreneurPulse.com.au  Fact Sheet for Healthcare Providers: IncredibleEmployment.be  This test is not yet approved or cleared by the [place] FDA and has been authorized for detection and/or diagnosis of SARS-CoV-2 by FDA under an Emergency Use Authorization (EUA). This EUA will remain in effect (meaning this test can be used) for the duration of  the COVID-19 declaration under Section 564(b)(1) of the Act, 21 U.S.C. section 360bbb-3(b)(1), unless the authorization is terminated or revoked.  Performed at [facility], [address] [place], [place] [address]   Basic metabolic panel     Status: Abnormal   Collection Time: 02/05/21  8:03 AM  Result Value Ref Range   Sodium 134 (L) 135 - 145 mmol/L   Potassium 4.3 3.5 - 5.1 mmol/L   Chloride 99 98 - 111 mmol/L   CO2 24 22 - 32 mmol/L   Glucose, Bld 270 (H) 70 - 99 mg/dL    Comment:  Glucose reference range applies only to samples taken after fasting for at least 8 hours.   BUN 23 8 - 23 mg/dL   Creatinine, Ser 1.69 (H) 0.44 - 1.00 mg/dL   Calcium 9.1 8.9 - 10.3 mg/dL   GFR, Estimated 31 (L) >60 mL/min    Comment: (NOTE) Calculated using the CKD-EPI Creatinine Equation (2021)    Anion gap 11 5 - 15    Comment: Performed at [facility], [address] [place], [place] [address]  CBC with Differential     Status: Abnormal   Collection Time: 02/05/21  8:03 AM  Result Value Ref Range   WBC 13.4 (H) 4.0 - 10.5 K/uL   RBC 4.66 3.87 - 5.11 MIL/uL   Hemoglobin 13.8 12.0 - 15.0 g/dL   HCT 41.0 36.0 - 46.0 %   MCV 88.0 80.0 - 100.0 fL   MCH 29.6 26.0 - 34.0 pg   MCHC 33.7 30.0 - 36.0 g/dL   RDW 14.3 11.5 - 15.5 %   Platelets 213 150 - 400 K/uL   nRBC 0.0 0.0 - 0.2 %   Neutrophils Relative % 69 %   Neutro Abs 9.2 (H) 1.7 - 7.7 K/uL   Lymphocytes Relative 18 %   Lymphs Abs 2.4 0.7 - 4.0 K/uL   Monocytes Relative 12 %   Monocytes Absolute 1.6 (H) 0.1 - 1.0 K/uL   Eosinophils Relative 1 %   Eosinophils Absolute 0.2 0.0 - 0.5 K/uL   Basophils Relative 0 %   Basophils Absolute 0.1 0.0 - 0.1 K/uL   Immature Granulocytes 0 %   Abs Immature Granulocytes 0.06 0.00 - 0.07 K/uL    Comment: Performed at [facility], [address] [place], [place] [address]  Magnesium     Status: Abnormal   Collection Time: 02/05/21  8:03 AM  Result Value Ref  Range   Magnesium 1.6 (L) 1.7 - 2.4 mg/dL    Comment: Performed at [facility], [address] [place], [place] [address]  Phosphorus     Status: None   Collection Time: 02/05/21  8:03 AM  Result Value Ref Range   Phosphorus 3.4 2.5 - 4.6 mg/dL    Comment: Performed at [facility], [address] [place], [place] [address]  Procalcitonin - Baseline     Status: None   Collection Time: 02/05/21  8:03 AM  Result Value Ref Range   Procalcitonin <0.10 ng/mL    Comment:        Interpretation: PCT (Procalcitonin) <= 0.5 ng/mL: Systemic infection (sepsis) is not likely. Local bacterial infection is possible. (NOTE)       Sepsis PCT Algorithm           Lower Respiratory Tract                                      Infection PCT Algorithm    ----------------------------     ----------------------------         PCT < 0.25 ng/mL                PCT < 0.10 ng/mL          Strongly encourage             Strongly discourage   discontinuation of antibiotics    initiation of antibiotics    ----------------------------     -----------------------------  PCT 0.25 - 0.50 ng/mL            PCT 0.10 - 0.25 ng/mL               OR       >80% decrease in PCT            Discourage initiation of                                            antibiotics      Encourage discontinuation           of antibiotics    ----------------------------     -----------------------------         PCT >= 0.50 ng/mL              PCT 0.26 - 0.50 ng/mL               AND        <80% decrease in PCT             Encourage initiation of                                             antibiotics       Encourage continuation           of antibiotics    ----------------------------     -----------------------------        PCT >= 0.50 ng/mL                  PCT > 0.50 ng/mL               AND         increase in PCT                  Strongly encourage                                      initiation of  antibiotics    Strongly encourage escalation           of antibiotics                                     -----------------------------                                           PCT <= 0.25 ng/mL                                                 OR                                        > 80% decrease in PCT  Discontinue / Do not initiate                                             antibiotics  Performed at [facility] [address] [place], [place] [address]   Glucose, capillary     Status: Abnormal   Collection Time: 02/05/21  3:01 PM  Result Value Ref Range   Glucose-Capillary 442 (H) 70 - 99 mg/dL    Comment: Glucose reference range applies only to samples taken after fasting for at least 8 hours.  Glucose, capillary     Status: Abnormal   Collection Time: 02/05/21  4:37 PM  Result Value Ref Range   Glucose-Capillary 378 (H) 70 - 99 mg/dL    Comment: Glucose reference range applies only to samples taken after fasting for at least 8 hours.    CT SOFT TISSUE NECK WO CONTRAST  Result Date: 02/05/2021 CLINICAL DATA:  Epiglottitis or tonsillitis suspected. Sore throat with difficulty swallowing for 2 days EXAM: CT NECK WITHOUT CONTRAST TECHNIQUE: Multidetector CT imaging of the neck was performed following the standard protocol without intravenous contrast. RADIATION DOSE REDUCTION: This exam was performed according to the departmental dose-optimization program which includes automated exposure control, adjustment of the mA and/or kV according to patient size and/or use of iterative reconstruction technique. COMPARISON:  None. FINDINGS: Pharynx and larynx: Low-density thickening of the epiglottis and aryepiglottic folds. Negative tonsils. No soft tissue gas in the neck.The glottis is closed at time of imaging; otherwise patent airway with some luminal narrowing due to the swollen supraglottic structures. Salivary glands: Unremarkable  Thyroid: Small gland which is symmetric Lymph nodes: No worrisome nodes Vascular: Is negative Limited intracranial: Negative Visualized orbits: Negative Mastoids and visualized paranasal sinuses: Clear Skeleton: Advanced cervical spine degeneration with especially bulky facet spurring. 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 Upper chest: Negative IMPRESSION: Confirmed supraglottitis/epiglottitis with swelling crowding the airway. Electronically Signed   By: Jorje Guild M.D.   On: 02/05/2021 09:37    ROS Blood pressure (!) 156/75, pulse 99, temperature (!) 97.5 F (36.4 C), temperature source Oral, resp. rate 18, height '5\' 3"'  (1.6 m), weight 65.8 kg, SpO2 97 %. Physical Exam Constitutional:      Appearance: She is well-developed.  HENT:     Head: Normocephalic.     Mouth/Throat:     Mouth: Mucous membranes are moist.     Comments: Exudate on the left soft palate and tonsil area. FOE_ Exudate on the right lateral pharyngeal wall. Epiglottis slightly thickened. Rest of larynx without significant swelling. TVC move normal and no swelling. Voice normal.  Eyes:     Conjunctiva/sclera: Conjunctivae normal.  Musculoskeletal:     Cervical back: Normal range of motion.  Neurological:     Mental Status: She is alert.      Assessment/Plan: Pharyngitis/supraglottis- she has exudate on the soft palate, tonsil area and lateral pharyngeal wall. Epiglottis is slightly swollen. No airway issue and no voice changes. She should resolve with steroids and antibiotics. Once sore throat improved she can be discharged. She can have diet from my standpoint.   [name] 02/05/2021, 5:07 PM

## 2021-02-05 NOTE — Plan of Care (Signed)
Plan of Care Note for accepted transfer  Patient: [name]    EMV:[id number]  DOA: 02/05/2021     Facility requesting transfer: [facility].  Requesting Provider: Malvin Johns, MD Reason for transfer: Supraglottitis/epiglottitis with swelling crowding the airway. Facility course: 81 year old female with a past medical history of aortic stenosis, CAD, CABG, PAD, breast cancer, anxiety, depression, diabetes, diabetic peripheral neuropathy essential hypertension, GERD, hyperlipidemia, hypothyroid [place], low back pain, osteoarthritis, reactive airways disease vitamin B12 and vitamin D deficiency who was diagnosed with influenza A 2 weeks ago and is going to the emergency department with complaints of sore throat since yesterday with progressively worse difficulty swallowing and handling her secretions.  No complaints of dyspnea.  No fever, chills, rashes or significant cough per Dr. Tamera Punt.  She was given ceftriaxone, vancomycin and dexamethasone.  ENT was contacted.  They will evaluate once the patient is at the hospital.  Plan of care: The patient is accepted for admission to Progressive unit, at [facility] per ENT preference, but may also be transferred to [facility] depending on bed availability.  Please call ENT on-call when the patient arrives to the facility.  Author: Reubin Milan, MD  02/05/2021  Check [email or web address] for on-call coverage.  Nursing staff, Please call [place] number on Amion as soon as patient's arrival, so appropriate admitting provider can evaluate the pt.

## 2021-02-05 NOTE — Progress Notes (Signed)
Pharmacy Antibiotic Note  [name] is a 81 y.o. female admitted on 02/05/2021 with epiglottis. Pharmacy has been consulted for vancomycin dosing. Cr ~1.7 but appears close to baseline.  Plan: Vancomycin 1000mg  IV q48h - est AUC 454 Ceftriaxone 2g IV q24h Follow cultures, LOT, Cr Vancomycin levels as indicated   Height: 5\' 3"  (160 cm) Weight: 65.8 kg (145 lb) IBW/kg (Calculated) : 52.4  Temp (24hrs), Avg:97.8 F (36.6 C), Min:97.5 F (36.4 C), Max:98 F (36.7 C)  Recent Labs  Lab 02/05/21 0803  WBC 13.4*  CREATININE 1.69*    Estimated Creatinine Clearance: 25 mL/min (A) (by C-G formula based on SCr of 1.69 mg/dL (H)).    Allergies  Allergen Reactions   Codeine Nausea And Vomiting and Other (See Comments)    Severe stomach cramps   Antihistamines, Diphenhydramine-Type Other (See Comments)    Causes hyperactivity   Gabapentin     Urinary incontinence   Statins Other (See Comments)    Joint pains   Sulfa Antibiotics Other (See Comments)    Reaction not recalled   Erythromycin Hives, Rash and Other (See Comments)    Allergic due to dental work from 50 years ago. It was used in packing and resulted in rash/hives inside and outside of mouth.   Trulicity [Dulaglutide] Itching    Antimicrobials this admission: Ceftriaxone 1/15 >>  Vancomycin 1/15 >>  Clindamycin 1/15 x1  Dose adjustments this admission: none  Microbiology results: pending  Thank you for allowing pharmacy to be a part of this patients care.  Arrie Senate, PharmD, BCPS, [facility] Clinical Pharmacist [phone] Please check AMION for all [place] numbers 02/05/2021

## 2021-02-05 NOTE — ED Notes (Signed)
Clindamycin stopped & d/c'd  per v.o. Dr. Tamera Punt.   MD explained to pt. Diagnosis and POC.  Pt. Verbalized understanding and agrees to admission POC.

## 2021-02-06 DIAGNOSIS — I10 Essential (primary) hypertension: Secondary | ICD-10-CM | POA: Diagnosis not present

## 2021-02-06 DIAGNOSIS — J0430 Supraglottitis, unspecified, without obstruction: Secondary | ICD-10-CM | POA: Diagnosis not present

## 2021-02-06 DIAGNOSIS — J0510 Acute epiglottitis without obstruction: Secondary | ICD-10-CM | POA: Diagnosis not present

## 2021-02-06 LAB — GLUCOSE, CAPILLARY
Glucose-Capillary: 350 mg/dL — ABNORMAL HIGH (ref 70–99)
Glucose-Capillary: 393 mg/dL — ABNORMAL HIGH (ref 70–99)
Glucose-Capillary: 470 mg/dL — ABNORMAL HIGH (ref 70–99)
Glucose-Capillary: 502 mg/dL (ref 70–99)

## 2021-02-06 LAB — BASIC METABOLIC PANEL
Anion gap: 13 (ref 5–15)
BUN: 29 mg/dL — ABNORMAL HIGH (ref 8–23)
CO2: 22 mmol/L (ref 22–32)
Calcium: 9 mg/dL (ref 8.9–10.3)
Chloride: 102 mmol/L (ref 98–111)
Creatinine, Ser: 1.66 mg/dL — ABNORMAL HIGH (ref 0.44–1.00)
GFR, Estimated: 31 mL/min — ABNORMAL LOW (ref >60)
Glucose, Bld: 362 mg/dL — ABNORMAL HIGH (ref 70–99)
Potassium: 4.5 mmol/L (ref 3.5–5.1)
Sodium: 137 mmol/L (ref 135–145)

## 2021-02-06 LAB — CBC
HCT: 39.9 % (ref 36.0–46.0)
Hemoglobin: 13.1 g/dL (ref 12.0–15.0)
MCH: 29.4 pg (ref 26.0–34.0)
MCHC: 32.8 g/dL (ref 30.0–36.0)
MCV: 89.5 fL (ref 80.0–100.0)
Platelets: 208 10*3/uL (ref 150–400)
RBC: 4.46 MIL/uL (ref 3.87–5.11)
RDW: 14 % (ref 11.5–15.5)
WBC: 14.2 10*3/uL — ABNORMAL HIGH (ref 4.0–10.5)
nRBC: 0 % (ref 0.0–0.2)

## 2021-02-06 LAB — PROCALCITONIN: Procalcitonin: <0.1 ng/mL

## 2021-02-06 MED ORDER — INSULIN ASPART 100 UNIT/ML IJ SOLN
15.0000 [IU] | Freq: Once | INTRAMUSCULAR | Status: AC
  Administered 2021-02-06: 15 [IU] via SUBCUTANEOUS

## 2021-02-06 MED ORDER — INSULIN ASPART 100 UNIT/ML IJ SOLN
0.0000 [IU] | Freq: Every day | INTRAMUSCULAR | Status: DC
  Administered 2021-02-06: 5 [IU] via SUBCUTANEOUS

## 2021-02-06 MED ORDER — ALUM & MAG HYDROXIDE-SIMETH 200-200-20 MG/5ML PO SUSP: 15.0000 mL | ORAL | Status: DC | PRN

## 2021-02-06 MED ORDER — PAROXETINE HCL 20 MG PO TABS
20.0000 mg | ORAL_TABLET | Freq: Every morning | ORAL | Status: DC
  Administered 2021-02-07: 20 mg via ORAL
  Filled 2021-02-06: qty 1

## 2021-02-06 MED ORDER — LEVOTHYROXINE SODIUM 100 MCG PO TABS
100.0000 ug | ORAL_TABLET | Freq: Every day | ORAL | Status: DC
  Administered 2021-02-07: 100 ug via ORAL
  Filled 2021-02-06: qty 1

## 2021-02-06 MED ORDER — INSULIN GLARGINE-YFGN 100 UNIT/ML ~~LOC~~ SOLN
10.0000 [IU] | Freq: Two times a day (BID) | SUBCUTANEOUS | Status: DC
  Administered 2021-02-06 – 2021-02-07 (×3): 10 [IU] via SUBCUTANEOUS
  Filled 2021-02-06 (×4): qty 0.1

## 2021-02-06 MED ORDER — INSULIN ASPART 100 UNIT/ML IJ SOLN
26.0000 [IU] | Freq: Once | INTRAMUSCULAR | Status: AC
  Administered 2021-02-06: 26 [IU] via SUBCUTANEOUS

## 2021-02-06 MED ORDER — METOPROLOL TARTRATE 25 MG PO TABS
25.0000 mg | ORAL_TABLET | Freq: Two times a day (BID) | ORAL | Status: DC
  Administered 2021-02-06 – 2021-02-07 (×3): 25 mg via ORAL
  Filled 2021-02-06 (×3): qty 1

## 2021-02-06 MED ORDER — PANTOPRAZOLE SODIUM 40 MG IV SOLR
40.0000 mg | INTRAVENOUS | Status: DC
  Administered 2021-02-06: 40 mg via INTRAVENOUS
  Filled 2021-02-06: qty 40

## 2021-02-06 MED ORDER — HYDRALAZINE HCL 25 MG PO TABS
25.0000 mg | ORAL_TABLET | Freq: Four times a day (QID) | ORAL | Status: DC | PRN
  Administered 2021-02-06 – 2021-02-07 (×2): 25 mg via ORAL
  Filled 2021-02-06 (×3): qty 1

## 2021-02-06 MED ORDER — INSULIN ASPART 100 UNIT/ML IJ SOLN
0.0000 [IU] | Freq: Three times a day (TID) | INTRAMUSCULAR | Status: DC
  Administered 2021-02-07: 11 [IU] via SUBCUTANEOUS

## 2021-02-06 NOTE — Progress Notes (Addendum)
Inpatient Diabetes Program Recommendations  AACE/ADA: New Consensus Statement on Inpatient Glycemic Control  Target Ranges:  Prepandial:   less than 140 mg/dL      Peak postprandial:   less than 180 mg/dL (1-2 hours)      Critically ill patients:  140 - 180 mg/dL    Latest Reference Range & Units 02/06/21 06:42 02/06/21 12:55  Glucose-Capillary 70 - 99 mg/dL 350 (H) 393 (H)    Latest Reference Range & Units 02/05/21 15:01 02/05/21 16:37 02/05/21 18:34 02/05/21 21:28  Glucose-Capillary 70 - 99 mg/dL 442 (H) 378 (H) 379 (H) 493 (H)    Latest Reference Range & Units 07/20/20 09:08 11/02/20 09:40 02/02/21 08:26  Hemoglobin A1C 4.6 - 6.5 % 7.7 (H) 7.2 (H) 10.5 (H)   Review of Glycemic Control  Diabetes history: DM2 Outpatient Diabetes medications: Jardiance 10 mg QAM, Glipizide XL 10 mg QAM, Ozempic 0.5 mg Qweek Current orders for Inpatient glycemic control: Semglee 10 units BID, Novolog 0-9 units TID with meals; Decadron 4 mg Q12H  Inpatient Diabetes Program Recommendations:    Insulin:  If steroids are continued as ordered and if post prandial glucose remains consistently over 180 mg/dl, please consider ordering Novolog 5 units TID with meals for meal coverage if patient eats at least 50% of meals.  HbgA1C:  A1C 10.5% on 02/02/21 indicating an average glucose of 255 mg/dl over the past 2-3 months.  Addendum 02/06/21@14 :00-Spoke with patient about diabetes and home regimen for diabetes control. Patient reports being followed by PCP for diabetes management and currently taking Jardiance 10 mg QAM, Glipizide XL 10 mg QAM, and Ozempic 0.5 mg Qweek as an outpatient for diabetes control.  Patient reports that she has recently had several changes with DM medications. Patient states that she recently taken off Metformin and Januvia and started Jardiance and Ozempic dose was increased. Patient states that her husband just picked up medications for her so she will start taking increased dose of Ozempic  when she goes home. Patient also reports that she thinks the Jardiance dose was increased as well.  Patient reports that she only checks her glucose fasting and it usually ranges from 80-170's mg/dl.  Also noted patient recently treated with influenza and and given steroids. Discussed impact of recent steroids on A1C. Discussed glucose and A1C goals. Discussed importance of checking CBGs and maintaining good CBG control to prevent long-term and short-term complications. Explained how hyperglycemia leads to damage within blood vessels which lead to the common complications seen with uncontrolled diabetes.   Patient states that she has never had hypoglycemia that she can recall. Discussed hypoglycemia along with treatment. Encouraged patient to check glucose at least 2 times per day and to be sure to let her provider know if she has any issues at all with hypoglycemia especially given recent changes with DM medications.  Patient verbalized understanding of information discussed and reports no further questions at this time related to diabetes.  Thanks, Barnie Alderman, RN, MSN, CDE Diabetes Coordinator Inpatient Diabetes Program [phone] (Team Pager from 8am to 5pm)

## 2021-02-06 NOTE — Telephone Encounter (Signed)
Chart supports rx refill Last ov: 02/02/2021 Last refill: 01/03/2021

## 2021-02-06 NOTE — Progress Notes (Signed)
Patient ID: [name], female   DOB: August 04, 1940, 81 y.o.   MRN: [id number]  PROGRESS NOTE    [name]  XTK:[id number] DOB: 1940-12-23 DOA: 02/05/2021 PCP: [name], MD   Brief Narrative:  81 y.o. female with history of CAD status post CABG and aortic valve replacement, diabetes mellitus type 2, history of breast cancer, chronic kidney disease stage IV, hypertension and recent outpatient diagnosis of influenza.  Upper respiratory infection treated with antibiotics presented with worsening sore throat and difficulty swallowing.  On presentation, CT scan of soft tissue of neck showed features concerning for supraglottitis/epiglottitis.  She was empirically started on IV antibiotics and steroids.  She was found to have leukocytosis and positive for influenza A.  ENT was consulted.  Assessment & Plan:   Acute supraglottitis/epiglottitis -Presented with worsening shortness of breath and difficulty swallowing. -CT scan of soft tissue of neck showed features concerning for supraglottitis/epiglottitis.  She was empirically started on IV antibiotics and steroids -ENT recommended to continue steroids and antibiotics and 1 sore throat is improved, she can be discharged -Patient is currently tolerating diet.  Sore throat is improving. -Continue broad-spectrum antibiotics along with IV steroids. -Blood cultures negative so far  Diabetes mellitus type 2 with hyperglycemia -Recent A1c was 10.5.  Continue CBGs with SSI.  Start long-acting insulin 10 units twice a day.  Home regimen on hold.  CKD stage IV -Creatinine stable.  Monitor  Hyponatremia Resolved  Leukocytosis -Worsening.  Possibly from steroids.  Monitor  Hypomagnesemia -Repeat a.m. labs  Hypertension -monitor blood pressure.  Resume home regimen.  History of CAD status post CABG and bioprosthetic valve aortic valve placement recently -No active issues currently.  Outpatient follow-up with his  cardiologist/cardiothoracic surgery  Recent diagnosis of influenza A -Patient apparently was diagnosed with influenza A recently as an outpatient.  Tested positive for influenza A again on admission.  Generalized deconditioning -PT eval  DVT prophylaxis: SCDs Code Status: Full Family Communication: None at bedside Disposition Plan: Status is: Inpatient  Remains inpatient appropriate because: Of need for IV antibiotics and steroids.  Consultants: ENT  Procedures: None  Antimicrobials:  Anti-infectives (From admission, onward)    Start     Dose/Rate Route Frequency Ordered Stop   02/07/21 1030  vancomycin (VANCOCIN) IVPB 1000 mg/200 mL premix        1,000 mg 200 mL/hr over 60 Minutes Intravenous Every 48 hours 02/05/21 1653     02/06/21 1000  cefTRIAXone (ROCEPHIN) 2 g in sodium chloride 0.9 % 100 mL IVPB  Status:  Discontinued        2 g 200 mL/hr over 30 Minutes Intravenous Every 24 hours 02/05/21 1645 02/05/21 1653   02/06/21 1000  cefTRIAXone (ROCEPHIN) 2 g in sodium chloride 0.9 % 100 mL IVPB        2 g 200 mL/hr over 30 Minutes Intravenous Every 24 hours 02/05/21 1653     02/05/21 1745  cefTRIAXone (ROCEPHIN) 1 g in sodium chloride 0.9 % 100 mL IVPB        1 g 200 mL/hr over 30 Minutes Intravenous STAT 02/05/21 1645 02/05/21 1830   02/05/21 1000  cefTRIAXone (ROCEPHIN) 1 g in sodium chloride 0.9 % 100 mL IVPB        1 g 200 mL/hr over 30 Minutes Intravenous  Once 02/05/21 0958 02/05/21 1156   02/05/21 1000  vancomycin (VANCOCIN) IVPB 1000 mg/200 mL premix        1,000 mg 200 mL/hr over  60 Minutes Intravenous  Once 02/05/21 0958 02/05/21 1152   02/05/21 0930  clindamycin (CLEOCIN) IVPB 600 mg  Status:  Discontinued        600 mg 100 mL/hr over 30 Minutes Intravenous  Once 02/05/21 3220 02/05/21 0956        Subjective: Patient seen and examined at bedside.  Feels slightly better but still complains of some sore throat.  Swallowing is improving.  Denies worsening  shortness of breath, nausea, vomiting or fever.  Objective: Vitals:   02/05/21 2107 02/05/21 2351 02/06/21 0250 02/06/21 0848  BP: (!) 145/84 132/77 (!) 165/90 (!) 164/89  Pulse: (!) 109 (!) 106 (!) 106 (!) 103  Resp: 19 20 19 17   Temp: 97.9 F (36.6 C) 98 F (36.7 C) 98.1 F (36.7 C) 97.6 F (36.4 C)  TempSrc: Oral  Oral Oral  SpO2: 95% 96% 93% 94%  Weight:      Height:        Intake/Output Summary (Last 24 hours) at 02/06/2021 1003 Last data filed at 02/06/2021 0857 Gross per 24 hour  Intake 940.07 ml  Output --  Net 940.07 ml   Filed Weights   02/05/21 0730  Weight: 65.8 kg    Examination:  General exam: Appears calm and comfortable.  Currently on room air. ENT exam: Some whitish patches on tonsils present Respiratory system: Bilateral decreased breath sounds at bases Cardiovascular system: S1 & S2 heard; intermittently tachycardic Gastrointestinal system: Abdomen is nondistended, soft and nontender. Normal bowel sounds heard. Extremities: No cyanosis, clubbing; trace lower extremity edema Central nervous system: Alert and oriented. No focal neurological deficits. Moving extremities Skin: No rashes, lesions or ulcers Psychiatry: Judgement and insight appear normal. Mood & affect appropriate.     Data Reviewed: I have personally reviewed following labs and imaging studies  CBC: Recent Labs  Lab 02/05/21 0803 02/06/21 0138  WBC 13.4* 14.2*  NEUTROABS 9.2*  --   HGB 13.8 13.1  HCT 41.0 39.9  MCV 88.0 89.5  PLT 213 254   Basic Metabolic Panel: Recent Labs  Lab 02/02/21 0826 02/05/21 0803 02/06/21 0612  NA  --  134* 137  K  --  4.3 4.5  CL  --  99 102  CO2  --  24 22  GLUCOSE 294* 270* 362*  BUN  --  23 29*  CREATININE  --  1.69* 1.66*  CALCIUM  --  9.1 9.0  MG  --  1.6*  --   PHOS  --  3.4  --    GFR: Estimated Creatinine Clearance: 25.5 mL/min (A) (by C-G formula based on SCr of 1.66 mg/dL (H)). Liver Function Tests: No results for  input(s): AST, ALT, ALKPHOS, BILITOT, PROT, ALBUMIN in the last 168 hours. No results for input(s): LIPASE, AMYLASE in the last 168 hours. No results for input(s): AMMONIA in the last 168 hours. Coagulation Profile: No results for input(s): INR, PROTIME in the last 168 hours. Cardiac Enzymes: No results for input(s): CKTOTAL, CKMB, CKMBINDEX, TROPONINI in the last 168 hours. BNP (last 3 results) Recent Labs    08/24/20 1005 09/01/20 1023 10/26/20 1137  PROBNP 2,246* 2,950* 2,308*   HbA1C: No results for input(s): HGBA1C in the last 72 hours. CBG: Recent Labs  Lab 02/05/21 1501 02/05/21 1637 02/05/21 1834 02/05/21 2128 02/06/21 0642  GLUCAP 442* 378* 379* 493* 350*   Lipid Profile: No results for input(s): CHOL, HDL, LDLCALC, TRIG, CHOLHDL, LDLDIRECT in the last 72 hours. Thyroid Function Tests: No results  for input(s): TSH, T4TOTAL, FREET4, T3FREE, THYROIDAB in the last 72 hours. Anemia Panel: No results for input(s): VITAMINB12, FOLATE, FERRITIN, TIBC, IRON, RETICCTPCT in the last 72 hours. Sepsis Labs: Recent Labs  Lab 02/05/21 0803 02/06/21 0612  PROCALCITON <0.10 <0.10    Recent Results (from the past 240 hour(s))  Group A Strep by PCR     Status: None   Collection Time: 02/05/21  7:38 AM   Specimen: Throat; Sterile Swab  Result Value Ref Range Status   Group A Strep by PCR NOT DETECTED NOT DETECTED Final    Comment: Performed at [facility], [address] [place], [place] [address]  Resp Panel by RT-PCR (Flu A&B, Covid) Nasopharyngeal Swab     Status: Abnormal   Collection Time: 02/05/21  7:38 AM   Specimen: Nasopharyngeal Swab; Nasopharyngeal(NP) swabs in vial transport medium  Result Value Ref Range Status   SARS Coronavirus 2 by RT PCR NEGATIVE NEGATIVE Final    Comment: (NOTE) SARS-CoV-2 target nucleic acids are NOT DETECTED.  The SARS-CoV-2 RNA is generally detectable in upper respiratory specimens during the acute phase of infection.  The lowest concentration of SARS-CoV-2 viral copies this assay can detect is 138 copies/mL. A negative result does not preclude SARS-Cov-2 infection and should not be used as the sole basis for treatment or other patient management decisions. A negative result may occur with  improper specimen collection/handling, submission of specimen other than nasopharyngeal swab, presence of viral mutation(s) within the areas targeted by this assay, and inadequate number of viral copies(<138 copies/mL). A negative result must be combined with clinical observations, patient history, and epidemiological information. The expected result is Negative.  Fact Sheet for Patients:  EntrepreneurPulse.com.au  Fact Sheet for Healthcare Providers:  IncredibleEmployment.be  This test is no t yet approved or cleared by the [place] FDA and  has been authorized for detection and/or diagnosis of SARS-CoV-2 by FDA under an Emergency Use Authorization (EUA). This EUA will remain  in effect (meaning this test can be used) for the duration of the COVID-19 declaration under Section 564(b)(1) of the Act, 21 U.S.C.section 360bbb-3(b)(1), unless the authorization is terminated  or revoked sooner.       Influenza A by PCR POSITIVE (A) NEGATIVE Final   Influenza B by PCR NEGATIVE NEGATIVE Final    Comment: (NOTE) The Xpert Xpress SARS-CoV-2/FLU/RSV plus assay is intended as an aid in the diagnosis of influenza from Nasopharyngeal swab specimens and should not be used as a sole basis for treatment. Nasal washings and aspirates are unacceptable for Xpert Xpress SARS-CoV-2/FLU/RSV testing.  Fact Sheet for Patients: EntrepreneurPulse.com.au  Fact Sheet for Healthcare Providers: IncredibleEmployment.be  This test is not yet approved or cleared by the [place] FDA and has been authorized for detection and/or diagnosis of SARS-CoV-2  by FDA under an Emergency Use Authorization (EUA). This EUA will remain in effect (meaning this test can be used) for the duration of the COVID-19 declaration under Section 564(b)(1) of the Act, 21 U.S.C. section 360bbb-3(b)(1), unless the authorization is terminated or revoked.  Performed at [facility], [address] [place], [place] [address]   Culture, blood (routine x 2)     Status: None (Preliminary result)   Collection Time: 02/05/21 10:17 AM   Specimen: Left Antecubital; Blood  Result Value Ref Range Status   Specimen Description   Final    LEFT ANTECUBITAL BLOOD Performed at [facility], [place],  [place], [place] [address]    Special Requests   Final    Blood Culture adequate volume BOTTLES DRAWN AEROBIC AND ANAEROBIC Performed at [facility], [address] [place], [place] [address]    Culture   Final    NO GROWTH < 24 HOURS Performed at [facility], [place] [address], [place], [place] [address]    Report Status PENDING  Incomplete  Culture, blood (routine x 2)     Status: None (Preliminary result)   Collection Time: 02/05/21 10:30 AM   Specimen: BLOOD RIGHT HAND  Result Value Ref Range Status   Specimen Description   Final    BLOOD RIGHT HAND BLOOD Performed at [facility], [address] [place], [place] [address]    Special Requests   Final    Blood Culture adequate volume BOTTLES DRAWN AEROBIC AND ANAEROBIC Performed at [facility], [address] [place], [place] [address]    Culture   Final    NO GROWTH < 24 HOURS Performed at [facility], [place] [address] [place], [place] [address]    Report Status PENDING  Incomplete         Radiology Studies: CT SOFT TISSUE NECK WO CONTRAST  Result Date: 02/05/2021 CLINICAL DATA:  Epiglottitis or tonsillitis suspected. Sore throat with difficulty swallowing for 2 days EXAM: CT NECK WITHOUT CONTRAST TECHNIQUE: Multidetector CT  imaging of the neck was performed following the standard protocol without intravenous contrast. RADIATION DOSE REDUCTION: This exam was performed according to the departmental dose-optimization program which includes automated exposure control, adjustment of the mA and/or kV according to patient size and/or use of iterative reconstruction technique. COMPARISON:  None. FINDINGS: Pharynx and larynx: Low-density thickening of the epiglottis and aryepiglottic folds. Negative tonsils. No soft tissue gas in the neck.The glottis is closed at time of imaging; otherwise patent airway with some luminal narrowing due to the swollen supraglottic structures. 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 Upper chest: Negative IMPRESSION: Confirmed supraglottitis/epiglottitis with swelling crowding the airway. Electronically Signed   By: Jorje Guild M.D.   On: 02/05/2021 09:37        Scheduled Meds:  dexamethasone (DECADRON) injection  4 mg Intravenous Q12H   insulin aspart  0-9 Units Subcutaneous TID WC   insulin glargine-yfgn  10 Units Subcutaneous BID   pantoprazole (PROTONIX) IV  40 mg Intravenous Q24H   Continuous Infusions:  cefTRIAXone (ROCEPHIN)  IV 2 g (02/06/21 0906)   lactated ringers 75 mL/hr at 02/06/21 0645   [START ON 02/07/2021] vancomycin            Aline August, MD [facility] 02/06/2021, 10:03 AM

## 2021-02-07 DIAGNOSIS — J0430 Supraglottitis, unspecified, without obstruction: Secondary | ICD-10-CM | POA: Diagnosis not present

## 2021-02-07 DIAGNOSIS — J0510 Acute epiglottitis without obstruction: Secondary | ICD-10-CM | POA: Diagnosis not present

## 2021-02-07 DIAGNOSIS — I10 Essential (primary) hypertension: Secondary | ICD-10-CM | POA: Diagnosis not present

## 2021-02-07 LAB — CBC WITH DIFFERENTIAL/PLATELET
Abs Immature Granulocytes: 0.09 10*3/uL — ABNORMAL HIGH (ref 0.00–0.07)
Basophils Absolute: 0 10*3/uL (ref 0.0–0.1)
Basophils Relative: 0 %
Eosinophils Absolute: 0 10*3/uL (ref 0.0–0.5)
Eosinophils Relative: 0 %
HCT: 36.3 % (ref 36.0–46.0)
Hemoglobin: 11.8 g/dL — ABNORMAL LOW (ref 12.0–15.0)
Immature Granulocytes: 1 %
Lymphocytes Relative: 11 %
Lymphs Abs: 1.7 10*3/uL (ref 0.7–4.0)
MCH: 29.1 pg (ref 26.0–34.0)
MCHC: 32.5 g/dL (ref 30.0–36.0)
MCV: 89.6 fL (ref 80.0–100.0)
Monocytes Absolute: 0.6 10*3/uL (ref 0.1–1.0)
Monocytes Relative: 4 %
Neutro Abs: 12.6 10*3/uL — ABNORMAL HIGH (ref 1.7–7.7)
Neutrophils Relative %: 84 %
Platelets: 218 10*3/uL (ref 150–400)
RBC: 4.05 MIL/uL (ref 3.87–5.11)
RDW: 14.2 % (ref 11.5–15.5)
WBC: 15 10*3/uL — ABNORMAL HIGH (ref 4.0–10.5)
nRBC: 0 % (ref 0.0–0.2)

## 2021-02-07 LAB — BASIC METABOLIC PANEL
Anion gap: 10 (ref 5–15)
BUN: 37 mg/dL — ABNORMAL HIGH (ref 8–23)
CO2: 22 mmol/L (ref 22–32)
Calcium: 8.7 mg/dL — ABNORMAL LOW (ref 8.9–10.3)
Chloride: 103 mmol/L (ref 98–111)
Creatinine, Ser: 1.7 mg/dL — ABNORMAL HIGH (ref 0.44–1.00)
GFR, Estimated: 31 mL/min — ABNORMAL LOW (ref >60)
Glucose, Bld: 292 mg/dL — ABNORMAL HIGH (ref 70–99)
Potassium: 4.7 mmol/L (ref 3.5–5.1)
Sodium: 135 mmol/L (ref 135–145)

## 2021-02-07 LAB — GLUCOSE, CAPILLARY
Glucose-Capillary: 292 mg/dL — ABNORMAL HIGH (ref 70–99)
Glucose-Capillary: 273 mg/dL — ABNORMAL HIGH (ref 70–99)
Glucose-Capillary: 415 mg/dL — ABNORMAL HIGH (ref 70–99)

## 2021-02-07 LAB — PROCALCITONIN: Procalcitonin: 0.16 ng/mL

## 2021-02-07 LAB — MAGNESIUM: Magnesium: 1.9 mg/dL (ref 1.7–2.4)

## 2021-02-07 MED ORDER — PANTOPRAZOLE SODIUM 40 MG PO TBEC: 40.0000 mg | DELAYED_RELEASE_TABLET | Freq: Every day | ORAL | Status: DC

## 2021-02-07 MED ORDER — DEXAMETHASONE 4 MG PO TABS: 4.0000 mg | ORAL_TABLET | Freq: Every day | ORAL | 0 refills | Status: AC

## 2021-02-07 MED ORDER — DOXYCYCLINE HYCLATE 100 MG PO CAPS: 100.0000 mg | ORAL_CAPSULE | Freq: Two times a day (BID) | ORAL | 0 refills | Status: AC

## 2021-02-07 MED ORDER — AMITRIPTYLINE HCL 10 MG PO TABS: 10.0000 mg | ORAL_TABLET | Freq: Every evening | ORAL | Status: DC | PRN

## 2021-02-07 MED ORDER — AMOXICILLIN-POT CLAVULANATE 875-125 MG PO TABS: 1.0000 | ORAL_TABLET | Freq: Two times a day (BID) | ORAL | 0 refills | Status: AC

## 2021-02-07 NOTE — Progress Notes (Signed)
Latest Reference Range & Units 02/06/21 21:16 02/07/21 02:06  Glucose-Capillary 70 - 99 mg/dL 502 (HH) 292 (H)  (HH): Data is critically high (H): Data is abnormally high  Dr. Cyd Silence was notified due to hight CBG. Order received for Insulin Novolog 26 units extra tonight.    BP 162/80-189/91 mmHg, Hydralazine,Labetalol and Metoprolol given. Pt denied headache, dizziness or blur vision. NSR on the monitor, HR 80s-108. We will monitor.  Kennyth Lose, RN

## 2021-02-07 NOTE — TOC Transition Note (Signed)
Transition of Care (TOC) - CM/SW Discharge Note Marvetta Gibbons RN, BSN Transitions of Care Unit 4E- RN Case Manager See Treatment Team for direct phone #    Patient Details  Name: [name] MRN: [id number] Date of Birth: Apr 10, 1940  Transition of Care [facility]) CM/SW Contact:  [name], RN Phone Number: 02/07/2021, 12:28 PM   Clinical Narrative:    Pt from home w/ spouse, Transition of Care Department [facility]) has reviewed patient and no TOC needs have been identified at this time. Pt stable for transition home today. PT eval completed with no f/u recommendations.   Final next level of care: Home/Self Care Barriers to Discharge: No Barriers Identified   Patient Goals and CMS Choice Patient states their goals for this hospitalization and ongoing recovery are:: return home CMS Medicare.gov Compare Post Acute Care list provided to:: Patient Choice offered to / list presented to : NA  Discharge Placement               home        Discharge Plan and Services In-house Referral: NA Discharge Planning Services: NA Post Acute Care Choice: NA          DME Arranged: N/A DME Agency: NA       HH Arranged: NA HH Agency: NA        Social Determinants of Health (SDOH) Interventions     Readmission Risk Interventions Readmission Risk Prevention Plan 02/07/2021  Transportation Screening Complete  PCP or Specialist Appt within 3-5 Days Complete  HRI or [place] Complete  Social Work Consult for [place] Planning/Counseling Complete  Palliative Care Screening Not Applicable  Medication Review Press photographer) Complete  Some recent data might be hidden

## 2021-02-07 NOTE — Discharge Summary (Addendum)
Physician Discharge Summary  [name] NKN:[id number] DOB: May 16, 1940 DOA: 02/05/2021  PCP: [name], MD  Admit date: 02/05/2021 Discharge date: 02/07/2021  Admitted From: Home Disposition: Home  Recommendations for Outpatient Follow-up:  Follow up with PCP in 1 week with repeat CBC/BMP Outpatient follow-up with ENT/Dr. Janace Hoard Follow up in ED if symptoms worsen or new appear   Home Health: No Equipment/Devices: None  Discharge Condition: Stable CODE STATUS: Full Diet recommendation: Heart healthy/carb modified  Brief/Interim Summary: 81 y.o. female with history of CAD status post CABG and aortic valve replacement, diabetes mellitus type 2, history of breast cancer, chronic kidney disease stage IV, hypertension and recent outpatient diagnosis of influenza.  Upper respiratory infection treated with antibiotics presented with worsening sore throat and difficulty swallowing.  On presentation, CT scan of soft tissue of neck showed features concerning for supraglottitis/epiglottitis.  She was empirically started on IV antibiotics and steroids.  She was found to have leukocytosis and positive for influenza A.  ENT was consulted. ENT recommended to continue steroids and antibiotics and once sore throat is improved, she can be discharged.  Her condition has improved.  She is currently hemodynamically stable with negative blood cultures.  Her swallowing is improved.  She wants to go home today.  She will be discharged home today on oral antibiotics and steroids for 1 more week with outpatient follow-up with PCP and ENT if needed.  Discharge Diagnoses:   Acute supraglottitis/epiglottitis -Presented with worsening shortness of breath and difficulty swallowing. -CT scan of soft tissue of neck showed features concerning for supraglottitis/epiglottitis.  She was empirically started on IV antibiotics and steroids -ENT recommended to continue steroids and antibiotics and 1 sore throat is  improved, she can be discharged -Her condition has improved.  She is currently hemodynamically stable with negative blood cultures.  Her swallowing is improved.  She wants to go home today.  She will be discharged home today on oral antibiotics (Augmentin and doxycycline) and steroids (Decadron 4 mg daily) for 1 more week with outpatient follow-up with PCP and ENT if needed -Blood cultures negative so far   Diabetes mellitus type 2 with hyperglycemia -Recent A1c was 10.5.  Blood sugars on the higher side.  Hopefully will improve once infection improves.  Continue home regimen.  Carb modified diet.  Outpatient follow-up.    CKD stage IV -Creatinine stable.  Outpatient follow-up.  Hyponatremia -Resolved  Leukocytosis -Possibly from combination of infection and steroids.  Outpatient follow-up.  Hypomagnesemia -Improved.  Continue replacement as an outpatient.  Hypertension -Outpatient follow-up.  Resume home regimen.   History of CAD status post CABG and bioprosthetic valve aortic valve placement recently -No active issues currently.  Outpatient follow-up with his cardiologist/cardiothoracic surgery   Recent diagnosis of influenza A: present on admission -Patient apparently was diagnosed with influenza A recently as an outpatient.  Tested positive for influenza A again on admission.   Discharge Instructions  Discharge Instructions     Diet - low sodium heart healthy   Complete by: As directed    Diet Carb Modified   Complete by: As directed    Increase activity slowly   Complete by: As directed       Allergies as of 02/07/2021       Reactions   Codeine Nausea And Vomiting, Other (See Comments)   Severe stomach cramps, also   Antihistamines, Diphenhydramine-type Other (See Comments)   Causes hyperactivity   Gabapentin    Urinary incontinence   Statins Other (  See Comments)   Joint pains   Sulfa Antibiotics Other (See Comments)   Reaction not recalled   Erythromycin  Hives, Rash, Other (See Comments)   Allergic due to dental work from 50 years ago. It was used in packing and resulted in rash/hives inside and outside of mouth.   Trulicity [dulaglutide] Itching        Medication List     STOP taking these medications    furosemide 20 MG tablet Commonly known as: LASIX       TAKE these medications    Accu-Chek FastClix Lancets Misc USE TO CHECK BLOOD SUGARS 3 TIMES DAILY   acetaminophen 500 MG tablet Commonly known as: TYLENOL Take 500 mg by mouth every 6 (six) hours as needed for mild pain or headache.   amitriptyline 10 MG tablet Commonly known as: ELAVIL Take 1 tablet (10 mg total) by mouth at bedtime as needed for sleep.   amoxicillin 500 MG capsule Commonly known as: AMOXIL Take 2,000 mg by mouth See admin instructions. Take 2,000 mg by mouth one hour prior to dental treatment   amoxicillin-clavulanate 875-125 MG tablet Commonly known as: Augmentin Take 1 tablet by mouth 2 (two) times daily for 7 days.   aspirin EC 81 MG tablet Take 81 mg by mouth in the morning.   blood glucose meter kit and supplies Dispense based on patient and insurance preference. Use up to four times daily as directed. (FOR ICD-10 E10.9, E11.9).   Blood Glucose Monitoring Suppl Devi by Does not apply route. Accu Check Guide   dexamethasone 4 MG tablet Commonly known as: DECADRON Take 1 tablet (4 mg total) by mouth daily for 7 days.   doxycycline 100 MG capsule Commonly known as: VIBRAMYCIN Take 1 capsule (100 mg total) by mouth 2 (two) times daily for 7 days.   glipiZIDE 10 MG 24 hr tablet Commonly known as: GLUCOTROL XL Take 1 tablet (10 mg total) by mouth daily with breakfast.   glucose blood test strip 1 each by Other route as needed for other. Use as instructed  Please dispense Accu check Guide Test strips   Accu-Chek Guide test strip Generic drug: glucose blood TEST ONCE DAILY   icosapent Ethyl 1 g capsule Commonly known as:  Vascepa Take 2 capsules (2 g total) by mouth 2 (two) times daily. What changed: when to take this   Jardiance 10 MG Tabs tablet Generic drug: empagliflozin TAKE 1 TABLET(10 MG) BY MOUTH DAILY BEFORE BREAKFAST What changed: See the new instructions.   levothyroxine 100 MCG tablet Commonly known as: SYNTHROID Take 100 mcg by mouth daily before breakfast. What changed: Another medication with the same name was removed. Continue taking this medication, and follow the directions you see here.   losartan 50 MG tablet Commonly known as: COZAAR Take 50 mg by mouth daily.   Magnesium 250 MG Tabs Take 250 mg by mouth at bedtime.   metoprolol tartrate 25 MG tablet Commonly known as: LOPRESSOR TAKE 1 TABLET(25 MG) BY MOUTH TWICE DAILY What changed:  how much to take how to take this when to take this additional instructions   Ozempic (0.25 or 0.5 MG/DOSE) 2 MG/1.5ML Sopn Generic drug: Semaglutide(0.25 or 0.5MG/DOS) Inject 0.5 mg into the skin once a week. What changed: when to take this   pantoprazole 40 MG tablet Commonly known as: PROTONIX Take 1 tablet (40 mg total) by mouth daily before breakfast.   PARoxetine 20 MG tablet Commonly known as: PAXIL Take 1 tablet (  20 mg total) by mouth in the morning.   Repatha SureClick 263 MG/ML Soaj Generic drug: Evolocumab Inject 1 pen into the skin every 14 (fourteen) days.   SIMPONI ARIA IV Inject into the vein every 2 (two) months.   traMADol 50 MG tablet Commonly known as: ULTRAM Take 1 tablet (50 mg total) by mouth at bedtime as needed for up to 14 days.   traZODone 50 MG tablet Commonly known as: DESYREL Take 0.5 tablets (25 mg total) by mouth at bedtime as needed for sleep.   valACYclovir 1000 MG tablet Commonly known as: VALTREX Take 1 tablet (1,000 mg total) by mouth daily as needed (Fever Blisters).   Vitamin D3 25 MCG (1000 UT) Caps Take 2,000 Units by mouth daily before lunch.        Follow-up Information      [name], MD. Schedule an appointment as soon as possible for a visit in 1 week(s).   Specialty: Family Medicine Why: with repeat cbc/bmp Contact information: [place] [address] [phone]         Melissa Montane, MD. Schedule an appointment as soon as possible for a visit in 1 week(s).   Specialty: Otolaryngology Contact information: [place] [address] [phone]                Allergies  Allergen Reactions   Codeine Nausea And Vomiting and Other (See Comments)    Severe stomach cramps, also   Antihistamines, Diphenhydramine-Type Other (See Comments)    Causes hyperactivity   Gabapentin     Urinary incontinence   Statins Other (See Comments)    Joint pains   Sulfa Antibiotics Other (See Comments)    Reaction not recalled   Erythromycin Hives, Rash and Other (See Comments)    Allergic due to dental work from 50 years ago. It was used in packing and resulted in rash/hives inside and outside of mouth.   Trulicity [Dulaglutide] Itching    Consultations: ENT   Procedures/Studies: DG Chest 2 View  Result Date: 01/23/2021 CLINICAL DATA:  cough. rales vs. rhonci right LLL EXAM: CHEST - 2 VIEW COMPARISON:  Chest x-ray 07/15/2020, CT cardiac 03/31/2020 FINDINGS: The heart and mediastinal contours are within normal limits. Replaced aortic valve. Aortic arch calcification. Biapical pleural/pulmonary scarring. No focal consolidation. Chronic mild coarsened interstitial markings. No pleural effusion. No pneumothorax. No acute osseous abnormality. IMPRESSION: 1. No active cardiopulmonary disease. 2.  Aortic Atherosclerosis (ICD10-I70.0). Electronically Signed   By: Iven Finn M.D.   On: 01/23/2021 16:52   CT SOFT TISSUE NECK WO CONTRAST  Result Date: 02/05/2021 CLINICAL DATA:  Epiglottitis or tonsillitis suspected. Sore throat with difficulty swallowing for 2 days EXAM: CT NECK WITHOUT CONTRAST TECHNIQUE:  Multidetector CT imaging of the neck was performed following the standard protocol without intravenous contrast. RADIATION DOSE REDUCTION: This exam was performed according to the departmental dose-optimization program which includes automated exposure control, adjustment of the mA and/or kV according to patient size and/or use of iterative reconstruction technique. COMPARISON:  None. FINDINGS: Pharynx and larynx: Low-density thickening of the epiglottis and aryepiglottic folds. Negative tonsils. No soft tissue gas in the neck.The glottis is closed at time of imaging; otherwise patent airway with some luminal narrowing due to the swollen supraglottic structures. Salivary glands: Unremarkable Thyroid: Small gland which is symmetric Lymph nodes: No worrisome nodes Vascular: Is negative Limited intracranial: Negative Visualized orbits: Negative Mastoids and visualized paranasal sinuses: Clear Skeleton: Advanced cervical spine  degeneration with especially bulky facet spurring. 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 Upper chest: Negative IMPRESSION: Confirmed supraglottitis/epiglottitis with swelling crowding the airway. Electronically Signed   By: Jorje Guild M.D.   On: 02/05/2021 09:37      Subjective: Patient seen and examined at bedside.  Feels that her swallowing has much improved.  Denies any overnight fever or vomiting.  No worsening shortness of breath reported.  Feels okay to go home today. Discharge Exam: Vitals:   02/07/21 0600 02/07/21 0800  BP: (!) 177/81 (!) 168/83  Pulse: 79 90  Resp: 19 16  Temp:  97.8 F (36.6 C)  SpO2: 97% 96%    General: Pt is alert, awake, not in acute distress.  Currently on room air. Cardiovascular: rate controlled, S1/S2 + Respiratory: bilateral decreased breath sounds at bases Abdominal: Soft, NT, ND, bowel sounds + Extremities: Trace lower extremity edema; no cyanosis    The results of significant diagnostics from this hospitalization (including imaging,  microbiology, ancillary and laboratory) are listed below for reference.     Microbiology: Recent Results (from the past 240 hour(s))  Group A Strep by PCR     Status: None   Collection Time: 02/05/21  7:38 AM   Specimen: Throat; Sterile Swab  Result Value Ref Range Status   Group A Strep by PCR NOT DETECTED NOT DETECTED Final    Comment: Performed at [facility], [address] [place], [place] [address]  Resp Panel by RT-PCR (Flu A&B, Covid) Nasopharyngeal Swab     Status: Abnormal   Collection Time: 02/05/21  7:38 AM   Specimen: Nasopharyngeal Swab; Nasopharyngeal(NP) swabs in vial transport medium  Result Value Ref Range Status   SARS Coronavirus 2 by RT PCR NEGATIVE NEGATIVE Final    Comment: (NOTE) SARS-CoV-2 target nucleic acids are NOT DETECTED.  The SARS-CoV-2 RNA is generally detectable in upper respiratory specimens during the acute phase of infection. The lowest concentration of SARS-CoV-2 viral copies this assay can detect is 138 copies/mL. A negative result does not preclude SARS-Cov-2 infection and should not be used as the sole basis for treatment or other patient management decisions. A negative result may occur with  improper specimen collection/handling, submission of specimen other than nasopharyngeal swab, presence of viral mutation(s) within the areas targeted by this assay, and inadequate number of viral copies(<138 copies/mL). A negative result must be combined with clinical observations, patient history, and epidemiological information. The expected result is Negative.  Fact Sheet for Patients:  EntrepreneurPulse.com.au  Fact Sheet for Healthcare Providers:  IncredibleEmployment.be  This test is no t yet approved or cleared by the [place] FDA and  has been authorized for detection and/or diagnosis of SARS-CoV-2 by FDA under an Emergency Use Authorization (EUA). This EUA will remain  in effect  (meaning this test can be used) for the duration of the COVID-19 declaration under Section 564(b)(1) of the Act, 21 U.S.C.section 360bbb-3(b)(1), unless the authorization is terminated  or revoked sooner.       Influenza A by PCR POSITIVE (A) NEGATIVE Final   Influenza B by PCR NEGATIVE NEGATIVE Final    Comment: (NOTE) The Xpert Xpress SARS-CoV-2/FLU/RSV plus assay is intended as an aid in the diagnosis of influenza from Nasopharyngeal swab specimens and should not be used as a sole basis for treatment. Nasal washings and aspirates are unacceptable for Xpert Xpress SARS-CoV-2/FLU/RSV testing.  Fact Sheet for Patients: EntrepreneurPulse.com.au  Fact Sheet for Healthcare Providers: IncredibleEmployment.be  This test is  not yet approved or cleared by the [place] and has been authorized for detection and/or diagnosis of SARS-CoV-2 by FDA under an Emergency Use Authorization (EUA). This EUA will remain in effect (meaning this test can be used) for the duration of the COVID-19 declaration under Section 564(b)(1) of the Act, 21 U.S.C. section 360bbb-3(b)(1), unless the authorization is terminated or revoked.  Performed at [facility], [address] [place], [place] [address]   Culture, blood (routine x 2)     Status: None (Preliminary result)   Collection Time: 02/05/21 10:17 AM   Specimen: Left Antecubital; Blood  Result Value Ref Range Status   Specimen Description   Final    LEFT ANTECUBITAL BLOOD Performed at [facility], [address] [place], [place] [address]    Special Requests   Final    Blood Culture adequate volume BOTTLES DRAWN AEROBIC AND ANAEROBIC Performed at [facility], [address] [place], [place] [address]    Culture   Final    NO GROWTH 2 DAYS Performed at [facility], [place] [address] [place], [place] [address]    Report Status PENDING  Incomplete  Culture,  blood (routine x 2)     Status: None (Preliminary result)   Collection Time: 02/05/21 10:30 AM   Specimen: BLOOD RIGHT HAND  Result Value Ref Range Status   Specimen Description   Final    BLOOD RIGHT HAND BLOOD Performed at [facility], [address] [place], [place] [address]    Special Requests   Final    Blood Culture adequate volume BOTTLES DRAWN AEROBIC AND ANAEROBIC Performed at [facility], [address] [place], [place] [address]    Culture   Final    NO GROWTH 2 DAYS Performed at [facility], [place] [address] [place], [place] [address]    Report Status PENDING  Incomplete     Labs: BNP (last 3 results) No results for input(s): BNP in the last 8760 hours. Basic Metabolic Panel: Recent Labs  Lab 02/02/21 0826 02/05/21 0803 02/06/21 0612 02/07/21 0201  NA  --  134* 137 135  K  --  4.3 4.5 4.7  CL  --  99 102 103  CO2  --  '24 22 22  ' GLUCOSE 294* 270* 362* 292*  BUN  --  23 29* 37*  CREATININE  --  1.69* 1.66* 1.70*  CALCIUM  --  9.1 9.0 8.7*  MG  --  1.6*  --  1.9  PHOS  --  3.4  --   --    Liver Function Tests: No results for input(s): AST, ALT, ALKPHOS, BILITOT, PROT, ALBUMIN in the last 168 hours. No results for input(s): LIPASE, AMYLASE in the last 168 hours. No results for input(s): AMMONIA in the last 168 hours. CBC: Recent Labs  Lab 02/05/21 0803 02/06/21 0138 02/07/21 0201  WBC 13.4* 14.2* 15.0*  NEUTROABS 9.2*  --  12.6*  HGB 13.8 13.1 11.8*  HCT 41.0 39.9 36.3  MCV 88.0 89.5 89.6  PLT 213 208 218   Cardiac Enzymes: No results for input(s): CKTOTAL, CKMB, CKMBINDEX, TROPONINI in the last 168 hours. BNP: Invalid input(s): POCBNP CBG: Recent Labs  Lab 02/06/21 1255 02/06/21 1641 02/06/21 2116 02/07/21 0206 02/07/21 0614  GLUCAP 393* 470* 502* 292* 273*   D-Dimer No results for input(s): DDIMER in the last 72 hours. Hgb A1c  No results for input(s): HGBA1C in the last 72 hours. Lipid Profile No  results for input(s): CHOL, HDL, LDLCALC, TRIG, CHOLHDL, LDLDIRECT in the last 72 hours. Thyroid function studies No results for input(s): TSH, T4TOTAL, T3FREE, THYROIDAB in the last 72 hours.  Invalid input(s): FREET3 Anemia work up No results for input(s): VITAMINB12, FOLATE, FERRITIN, TIBC, IRON, RETICCTPCT in the last 72 hours. Urinalysis    Component Value Date/Time   COLORURINE YELLOW 11/02/2020 0940   APPEARANCEUR CLEAR 11/02/2020 0940   LABSPEC 1.010 11/02/2020 0940   PHURINE 6.0 11/02/2020 0940   GLUCOSEU NEGATIVE 11/02/2020 0940   HGBUR NEGATIVE 11/02/2020 0940   BILIRUBINUR NEGATIVE 11/02/2020 0940   BILIRUBINUR negative 10/19/2020 1549   KETONESUR NEGATIVE 11/02/2020 0940   PROTEINUR Positive (A) 10/19/2020 1549   PROTEINUR NEGATIVE 05/02/2020 1230   UROBILINOGEN 0.2 11/02/2020 0940   NITRITE NEGATIVE 11/02/2020 0940   LEUKOCYTESUR SMALL (A) 11/02/2020 0940   Sepsis Labs Invalid input(s): PROCALCITONIN,  WBC,  LACTICIDVEN Microbiology Recent Results (from the past 240 hour(s))  Group A Strep by PCR     Status: None   Collection Time: 02/05/21  7:38 AM   Specimen: Throat; Sterile Swab  Result Value Ref Range Status   Group A Strep by PCR NOT DETECTED NOT DETECTED Final    Comment: Performed at [facility], [address] [place], [place] [address]  Resp Panel by RT-PCR (Flu A&B, Covid) Nasopharyngeal Swab     Status: Abnormal   Collection Time: 02/05/21  7:38 AM   Specimen: Nasopharyngeal Swab; Nasopharyngeal(NP) swabs in vial transport medium  Result Value Ref Range Status   SARS Coronavirus 2 by RT PCR NEGATIVE NEGATIVE Final    Comment: (NOTE) SARS-CoV-2 target nucleic acids are NOT DETECTED.  The SARS-CoV-2 RNA is generally detectable in upper respiratory specimens during the acute phase of infection. The lowest concentration of SARS-CoV-2 viral copies this assay can detect is 138 copies/mL. A negative result does not preclude  SARS-Cov-2 infection and should not be used as the sole basis for treatment or other patient management decisions. A negative result may occur with  improper specimen collection/handling, submission of specimen other than nasopharyngeal swab, presence of viral mutation(s) within the areas targeted by this assay, and inadequate number of viral copies(<138 copies/mL). A negative result must be combined with clinical observations, patient history, and epidemiological information. The expected result is Negative.  Fact Sheet for Patients:  EntrepreneurPulse.com.au  Fact Sheet for Healthcare Providers:  IncredibleEmployment.be  This test is no t yet approved or cleared by the [place] FDA and  has been authorized for detection and/or diagnosis of SARS-CoV-2 by FDA under an Emergency Use Authorization (EUA). This EUA will remain  in effect (meaning this test can be used) for the duration of the COVID-19 declaration under Section 564(b)(1) of the Act, 21 U.S.C.section 360bbb-3(b)(1), unless the authorization is terminated  or revoked sooner.       Influenza A by PCR POSITIVE (A) NEGATIVE Final   Influenza B by PCR NEGATIVE NEGATIVE Final    Comment: (NOTE) The Xpert Xpress SARS-CoV-2/FLU/RSV plus assay is intended as an aid in the diagnosis of influenza from Nasopharyngeal swab specimens and should not be used as a sole basis for treatment. Nasal washings and aspirates are unacceptable for Xpert Xpress SARS-CoV-2/FLU/RSV testing.  Fact Sheet for Patients: EntrepreneurPulse.com.au  Fact Sheet for Healthcare Providers: IncredibleEmployment.be  This test is not yet approved or cleared by the [place] FDA and has  been authorized for detection and/or diagnosis of SARS-CoV-2 by FDA under an Emergency Use Authorization (EUA). This EUA will remain in effect (meaning this test can be used) for the duration of  the COVID-19 declaration under Section 564(b)(1) of the Act, 21 U.S.C. section 360bbb-3(b)(1), unless the authorization is terminated or revoked.  Performed at [facility], [address] [place], [place] [address]   Culture, blood (routine x 2)     Status: None (Preliminary result)   Collection Time: 02/05/21 10:17 AM   Specimen: Left Antecubital; Blood  Result Value Ref Range Status   Specimen Description   Final    LEFT ANTECUBITAL BLOOD Performed at [facility], [address] [place], [place] [address]    Special Requests   Final    Blood Culture adequate volume BOTTLES DRAWN AEROBIC AND ANAEROBIC Performed at [facility], [address] [place], [place] [address]    Culture   Final    NO GROWTH 2 DAYS Performed at [facility], [place] [address] [place], [place] [address]    Report Status PENDING  Incomplete  Culture, blood (routine x 2)     Status: None (Preliminary result)   Collection Time: 02/05/21 10:30 AM   Specimen: BLOOD RIGHT HAND  Result Value Ref Range Status   Specimen Description   Final    BLOOD RIGHT HAND BLOOD Performed at [facility], [address] [place], [place] [address]    Special Requests   Final    Blood Culture adequate volume BOTTLES DRAWN AEROBIC AND ANAEROBIC Performed at [facility], [address] [place], [place] [address]    Culture   Final    NO GROWTH 2 DAYS Performed at [facility], [place] [address], [place], [place] [address]    Report Status PENDING  Incomplete     Time coordinating discharge: 35 minutes  SIGNED:   Aline August, MD  [facility] 02/07/2021, 9:24 AM

## 2021-02-07 NOTE — Evaluation (Signed)
Physical Therapy Evaluation & Discharge Patient Details Name: [name] MRN: [id number] DOB: 12-Jun-1940 Today's Date: 02/07/2021  History of Present Illness  Pt is a 81 y.o. female with recent outpatient dx of influenza, now admitted 02/05/21 with difficulty swallowing. Neck CT concerning for supraglottitis/epiglottitis. PMH includes CAD (s/p CABG, AVR), DM2, breast CA, CKD IV, HTN, OA, PAD, RA, lumbar stenosis s/p decompression.   Clinical Impression  Patient evaluated by Physical Therapy with no further acute PT needs identified. PTA, pt independent, lives with supportive husband, drives. Today, pt moving well with supervision for safety. Educ re: activity recommendations, importance of mobility, fall risk reduction. Pt reports husband able to provide necessary assist. All education has been completed and the patient has no further questions. Acute PT is signing off. Thank you for this referral.     Recommendations for follow up therapy are one component of a multi-disciplinary discharge planning process, led by the attending physician.  Recommendations may be updated based on patient status, additional functional criteria and insurance authorization.  Follow Up Recommendations No PT follow up    Assistance Recommended at Discharge Intermittent Supervision/Assistance  Patient can return home with the following  Help with stairs or ramp for entrance    Equipment Recommendations None recommended by PT  Recommendations for Other Services              Precautions / Restrictions Precautions Precautions: Fall Restrictions Weight Bearing Restrictions: No      Mobility  Bed Mobility Overal bed mobility: Independent                  Transfers Overall transfer level: Independent                      Ambulation/Gait Ambulation/Gait assistance: Min guard, Supervision Gait Distance (Feet): 180 Feet Assistive device: None Gait Pattern/deviations: Step-through  pattern, Decreased stride length Gait velocity: Decreased     General Gait Details: Slow, mostly steady gait with initial use of hallway rail for stability, min guard for balance; progressing to no UE support (although still reaching to some furniture in room) with supervision for safety/lines  Stairs            Wheelchair Mobility    Modified Rankin (Stroke Patients Only)       Balance Overall balance assessment: Mild deficits observed, not formally tested   Sitting balance-Leahy Scale: Good       Standing balance-Leahy Scale: Fair                               Pertinent Vitals/Pain Pain Assessment Pain Assessment: Faces Faces Pain Scale: Hurts a little bit Pain Location: throat Pain Descriptors / Indicators: Sore Pain Intervention(s): Monitored during session    Home Living Family/patient expects to be discharged to:: Private residence Living Arrangements: Spouse/significant other Available Help at Discharge: Family;Available 24 hours/day Type of Home: House Home Access: Stairs to enter Entrance Stairs-Rails: Psychiatric nurse of Steps: 1 or 5   Home Layout: One level Home Equipment: Rolling Walker (2 wheels);BSC/3in1;Grab bars - tub/shower;Crutches;Wheelchair - manual Additional Comments: Has access to tub seat if needed    Prior Function Prior Level of Function : Independent/Modified Independent;Driving             Mobility Comments: Typically mod indep without DME, admits to furniture surfing around home, relies on shopping cart at stores ADLs Comments: Independent without DME  Hand Dominance        Extremity/Trunk Assessment   Upper Extremity Assessment Upper Extremity Assessment: Overall WFL for tasks assessed    Lower Extremity Assessment Lower Extremity Assessment: Overall WFL for tasks assessed    Cervical / Trunk Assessment Cervical / Trunk Assessment: Normal  Communication   Communication: HOH   Cognition Arousal/Alertness: Awake/alert Behavior During Therapy: WFL for tasks assessed/performed Overall Cognitive Status: Within Functional Limits for tasks assessed                                          General Comments General comments (skin integrity, edema, etc.): HR 91-100s, SpO2 99% on RA. Educ on activity recommendations (daily walking program)    Exercises     Assessment/Plan    PT Assessment Patient does not need any further PT services  PT Problem List         PT Treatment Interventions      PT Goals (Current goals can be found in the Care Plan section)  Acute Rehab PT Goals PT Goal Formulation: All assessment and education complete, DC therapy    Frequency       Co-evaluation               AM-PAC PT "6 Clicks" Mobility  Outcome Measure Help needed turning from your back to your side while in a flat bed without using bedrails?: None Help needed moving from lying on your back to sitting on the side of a flat bed without using bedrails?: None Help needed moving to and from a bed to a chair (including a wheelchair)?: None Help needed standing up from a chair using your arms (e.g., wheelchair or bedside chair)?: None Help needed to walk in hospital room?: A Little Help needed climbing 3-5 steps with a railing? : A Little 6 Click Score: 22    End of Session Equipment Utilized During Treatment: Gait belt Activity Tolerance: Patient tolerated treatment well Patient left: in chair;with call bell/phone within reach Nurse Communication: Mobility status PT Visit Diagnosis: Other abnormalities of gait and mobility (R26.89)    Time: 0017-4944 PT Time Calculation (min) (ACUTE ONLY): 14 min   Charges:   PT Evaluation $PT Eval Low Complexity: 1 Low        Mabeline Caras, PT, DPT Acute Rehabilitation Services  Pager [phone] Office [place] 02/07/2021, 9:29 AM

## 2021-02-08 ENCOUNTER — Telehealth: Payer: Self-pay

## 2021-02-08 NOTE — Telephone Encounter (Signed)
Transition Care Management Unsuccessful Follow-up Telephone Call  Date of discharge and from where:  01/147/2023   [name]   Attempts:  1st Attempt  Reason for unsuccessful TCM follow-up call:  Unable to reach patient

## 2021-02-09 ENCOUNTER — Other Ambulatory Visit: Payer: Medicare Other

## 2021-02-09 ENCOUNTER — Other Ambulatory Visit: Payer: Self-pay

## 2021-02-09 DIAGNOSIS — E785 Hyperlipidemia, unspecified: Secondary | ICD-10-CM

## 2021-02-09 LAB — LIPID PANEL
Chol/HDL Ratio: 2.9 ratio (ref 0.0–4.4)
Cholesterol, Total: 125 mg/dL (ref 100–199)
HDL: 43 mg/dL (ref >39)
LDL Chol Calc (NIH): 45 mg/dL (ref 0–99)
Triglycerides: 238 mg/dL — ABNORMAL HIGH (ref 0–149)
VLDL Cholesterol Cal: 37 mg/dL (ref 5–40)

## 2021-02-10 ENCOUNTER — Other Ambulatory Visit: Payer: Self-pay

## 2021-02-10 LAB — CULTURE, BLOOD (ROUTINE X 2)
Culture: NO GROWTH
Culture: NO GROWTH
Special Requests: ADEQUATE
Special Requests: ADEQUATE

## 2021-02-13 ENCOUNTER — Ambulatory Visit (INDEPENDENT_AMBULATORY_CARE_PROVIDER_SITE_OTHER): Payer: Medicare Other | Admitting: Family Medicine

## 2021-02-13 ENCOUNTER — Other Ambulatory Visit: Payer: Self-pay

## 2021-02-13 VITALS — BP 124/78 | HR 76 | Temp 97.6°F | Ht 63.0 in | Wt 143.2 lb

## 2021-02-13 DIAGNOSIS — B370 Candidal stomatitis: Secondary | ICD-10-CM

## 2021-02-13 DIAGNOSIS — J0510 Acute epiglottitis without obstruction: Secondary | ICD-10-CM

## 2021-02-13 LAB — MAGNESIUM: Magnesium: 1.8 mg/dL (ref 1.5–2.5)

## 2021-02-13 LAB — COMPREHENSIVE METABOLIC PANEL
ALT: 13 U/L (ref 0–35)
AST: 14 U/L (ref 0–37)
Albumin: 3.8 g/dL (ref 3.5–5.2)
Alkaline Phosphatase: 96 U/L (ref 39–117)
BUN: 48 mg/dL — ABNORMAL HIGH (ref 6–23)
CO2: 23 mEq/L (ref 19–32)
Calcium: 9.8 mg/dL (ref 8.4–10.5)
Chloride: 97 mEq/L (ref 96–112)
Creatinine, Ser: 1.71 mg/dL — ABNORMAL HIGH (ref 0.40–1.20)
GFR: 28.4 mL/min — ABNORMAL LOW (ref >60.00)
Glucose, Bld: 527 mg/dL (ref 70–99)
Potassium: 5.5 mEq/L — ABNORMAL HIGH (ref 3.5–5.1)
Sodium: 132 mEq/L — ABNORMAL LOW (ref 135–145)
Total Bilirubin: 0.6 mg/dL (ref 0.2–1.2)
Total Protein: 7.1 g/dL (ref 6.0–8.3)

## 2021-02-13 LAB — CBC
HCT: 42.6 % (ref 36.0–46.0)
Hemoglobin: 13.8 g/dL (ref 12.0–15.0)
MCHC: 32.4 g/dL (ref 30.0–36.0)
MCV: 90.4 fl (ref 78.0–100.0)
Platelets: 269 10*3/uL (ref 150.0–400.0)
RBC: 4.72 Mil/uL (ref 3.87–5.11)
RDW: 15.8 % — ABNORMAL HIGH (ref 11.5–15.5)
WBC: 11.7 10*3/uL — ABNORMAL HIGH (ref 4.0–10.5)

## 2021-02-13 MED ORDER — NYSTATIN 100000 UNIT/ML MT SUSP: 5.0000 mL | Freq: Four times a day (QID) | OROMUCOSAL | 0 refills | Status: DC

## 2021-02-13 NOTE — Progress Notes (Signed)
[facility] [address] [place] [address] Dept: [phone] Dept Fax: [phone]  Office Visit  Subjective:    Patient ID: [name], female    DOB: 10/05/1940, 81 y.o..   MRN: [id number]  Chief Complaint  Patient presents with   Hospitalization [facility] f/u form 02/05/21.  She is still having some problems with tongue being irritated. She has been able to eat.      History of Present Illness:  Patient is in today for follow-up from her recent hospitalization. She was admitted at [facility] from 1/15-1/17 with worsening sore throat and difficulty swallowing. A CT scan showed features concerning for supraglottitis/epiglottitis. This had all started with an Influenza A infection starting around New Years. She was admitted and treated with antibiotics and steroids with improvement in her symptoms. [name] notes that she is breathing well at this point. She does described a burning sensation to her tongue and throat, a bad taste in her mouth, and a raw feeling. She is finishing up her antibiotics tomorrow.  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   Primary insomnia 11/02/2020   Irritation of eyelid 07/26/2020   S/P aortic valve replacement with bioprosthetic valve 05/04/2020   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   PAD (peripheral artery disease) (HCC)    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[place]) 04/07/2019   Low back pain 02/03/2019   Scoliosis deformity of spine 02/03/2019   Osteoarthritis of hip 11/17/2018   Claudication ([place])    Chronic headaches 11/12/2017   Degeneration of lumbar intervertebral disc 06/13/2017   Hyperkalemia 06/03/2017   CKD stage 4  due to type 2 diabetes mellitus ([place])    Mass of left adrenal gland ([place]) 04/19/2017   Spinal stenosis of lumbar region 10/20/2014   Hyperlipidemia 03/16/2013   Dense breasts 12/22/2012   Anxiety state 04/16/2012   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/16/2012   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   Osteoarthritis of knee 04/16/2012   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 female breast ([place]) 12/20/2010   History of breast cancer 12/05/2010   Depression 09/14/2008   GERD 09/14/2008   Type 2 diabetes mellitus with diabetic neuropathy, without long-term current use of insulin ([place]) 05/20/2008   Essential hypertension 04/17/2007   Coronary atherosclerosis 04/17/2007   Hypothyroidism 04/03/2007   Past Surgical History:  Procedure Laterality Date   ABDOMINAL AORTOGRAM W/LOWER EXTREMITY Bilateral 04/29/2019   Procedure: ABDOMINAL AORTOGRAM W/LOWER EXTREMITY;  Surgeon: Marty Heck, MD;  Location: [facility];  Service: Cardiovascular;  Laterality: Bilateral;   ABDOMINAL AORTOGRAM W/LOWER EXTREMITY Left 07/01/2019   Procedure: ABDOMINAL AORTOGRAM W/ Left LOWER EXTREMITY [place];  Surgeon: Marty Heck, MD;  Location: [facility];  Service: Cardiovascular;  Laterality: Left;   ANTERIOR AND POSTERIOR REPAIR N/A 04/05/2016   Procedure: ANTERIOR (CYSTOCELE) AND POSTERIOR REPAIR (RECTOCELE);  Surgeon: [name], MD;  Location: [facility];  Service: Gynecology;  Laterality: N/A;   AORTIC VALVE REPLACEMENT N/A 05/04/2020   Procedure: AORTIC VALVE REPLACEMENT (AVR) USING INSPIRIS 23MM RESILIA AORTIC VALVE;  Surgeon: Rexene Alberts, MD;  Location: [facility];  Service: Open Heart Surgery;  Laterality: N/A;   BREAST BIOPSY  BREAST LUMPECTOMY  1983   benign biopsy   BREAST LUMPECTOMY Right 12/29/2010   snbx, ER/PR +, Her2 -, 0/1 node pos.   CARPAL TUNNEL RELEASE Bilateral 9/12,8,12   CHOLECYSTECTOMY     COLONOSCOPY N/A 02/09/2013   Procedure:  COLONOSCOPY;  Surgeon: [name], MD;  Location: [facility];  Service: Endoscopy;  Laterality: N/A;   COLONOSCOPY     CORONARY ANGIOPLASTY     CORONARY ARTERY BYPASS GRAFT N/A 05/04/2020   Procedure: CORONARY ARTERY BYPASS GRAFTING (CABG), ON PUMP, TIMES TWO, USING LEFT INTERNAL MAMMARY ARTERY AND RIGHT ENDOSCOPICALLY HARVESTED GREATER SAPHENOUS VEIN;  Surgeon: Rexene Alberts, MD;  Location: [facility];  Service: Open Heart Surgery;  Laterality: N/A;   DILATION AND CURETTAGE OF UTERUS     ESOPHAGOGASTRODUODENOSCOPY N/A 02/09/2013   Procedure: ESOPHAGOGASTRODUODENOSCOPY (EGD);  Surgeon: [name], MD;  Location: [name];  Service: Endoscopy;  Laterality: N/A;   FOOT SPURS     HYSTEROSCOPY WITH D & C N/A 09/11/2012   Procedure: DILATATION AND CURETTAGE ;  Surgeon: [name], MD;  Location: [facility];  Service: Gynecology;  Laterality: N/A;   KNEE SURGERY  2007   LAPAROSCOPIC ASSISTED VAGINAL HYSTERECTOMY N/A 04/05/2016   Procedure: LAPAROSCOPIC ASSISTED VAGINAL HYSTERECTOMY possible BSO;  Surgeon: [name], MD;  Location: [facility];  Service: Gynecology;  Laterality: N/A;   LUMBAR LAMINECTOMY/DECOMPRESSION MICRODISCECTOMY N/A 10/20/2014   Procedure: MICRO LUMBER DECOMPRESSION L3-4 L4-5;  Surgeon: [name], MD;  Location: [facility];  Service: Orthopedics;  Laterality: N/A;   LUMBAR LAMINECTOMY/DECOMPRESSION MICRODISCECTOMY Bilateral 10/13/2015   Procedure: MICRO LUMBAR DECOMPRESSION L5 - S1 AND REDO DECOMPRESSION L4 - L5 AND REMOVAL OF FACET CYST L4 - L5 2 LEVELS;  Surgeon: [name], MD;  Location: [facility];  Service: Orthopedics;  Laterality: Bilateral;   PERIPHERAL VASCULAR INTERVENTION Right 04/29/2019   Procedure: PERIPHERAL VASCULAR INTERVENTION;  Surgeon: Marty Heck, MD;  Location: [facility];  Service: Cardiovascular;  Laterality: Right;   PORT-A-CATH REMOVAL  04/17/2011   Procedure: REMOVAL PORT-A-CATH;  Surgeon: [name], MD;  Location: [facility];  Service: General;  Laterality: Left;   PORTACATH PLACEMENT  02/07/2011   Procedure: INSERTION PORT-A-CATH;  Surgeon: [name], MD;  Location: [facility];  Service: General;  Laterality: N/A;   RIGHT/LEFT HEART CATH AND CORONARY ANGIOGRAPHY N/A 03/17/2020   Procedure: RIGHT/LEFT HEART CATH AND CORONARY ANGIOGRAPHY;  Surgeon: Burnell Blanks, MD;  Location: [facility];  Service: Cardiovascular;  Laterality: N/A;   TEE WITHOUT CARDIOVERSION N/A 05/04/2020   Procedure: TRANSESOPHAGEAL ECHOCARDIOGRAM (TEE);  Surgeon: Rexene Alberts, MD;  Location: [facility];  Service: Open Heart Surgery;  Laterality: N/A;   UPPER GASTROINTESTINAL ENDOSCOPY     Family History  Problem Relation Age of Onset   Kidney disease Mother    Heart disease Mother    Throat cancer Father    Alcoholism Father    Heart attack Father    Lymphoma Brother 67   Heart attack Brother    Diabetes Brother    Hypertension Brother    Cancer Son    Colon cancer Neg Hx    Stroke Neg Hx    Esophageal cancer Neg Hx    Rectal cancer Neg Hx    Stomach cancer Neg Hx     Outpatient Medications Prior to Visit  Medication Sig Dispense Refill   Accu-Chek FastClix Lancets MISC USE TO CHECK BLOOD SUGARS 3 TIMES DAILY 306 each 3  acetaminophen (TYLENOL) 500 MG tablet Take 500 mg by mouth every 6 (six) hours as needed for mild pain or headache.     amitriptyline (ELAVIL) 10 MG tablet Take 1 tablet (10 mg total) by mouth at bedtime as needed for sleep.     amoxicillin (AMOXIL) 500 MG capsule Take 2,000 mg by mouth See admin instructions. Take 2,000 mg by mouth one hour prior to dental treatment     amoxicillin-clavulanate (AUGMENTIN) 875-125 MG tablet Take 1 tablet by mouth 2 (two) times daily for 7 days. 14 tablet 0   aspirin EC 81 MG tablet Take 81 mg by mouth in the morning.     blood glucose meter kit and supplies Dispense based on patient and insurance preference. Use up to four times daily as directed.  (FOR ICD-10 E10.9, E11.9). 1 each 0   Blood Glucose Monitoring Suppl DEVI by Does not apply route. Accu Check Guide     Cholecalciferol (VITAMIN D3) 25 MCG (1000 UT) CAPS Take 2,000 Units by mouth daily before lunch.     dexamethasone (DECADRON) 4 MG tablet Take 1 tablet (4 mg total) by mouth daily for 7 days. 7 tablet 0   doxycycline (VIBRAMYCIN) 100 MG capsule Take 1 capsule (100 mg total) by mouth 2 (two) times daily for 7 days. 14 capsule 0   Evolocumab (REPATHA SURECLICK) 034 MG/ML SOAJ Inject 1 pen into the skin every 14 (fourteen) days. 2 mL 11   glipiZIDE (GLUCOTROL XL) 10 MG 24 hr tablet Take 1 tablet (10 mg total) by mouth daily with breakfast. 90 tablet 3   glucose blood (ACCU-CHEK GUIDE) test strip TEST ONCE DAILY 100 each 5   glucose blood test strip 1 each by Other route as needed for other. Use as instructed  Please dispense Accu check Guide Test strips     Golimumab (SIMPONI ARIA IV) Inject into the vein every 2 (two) months.     icosapent Ethyl (VASCEPA) 1 g capsule Take 2 capsules (2 g total) by mouth 2 (two) times daily. (Patient taking differently: Take 2 g by mouth in the morning and at bedtime.) 120 capsule 11   JARDIANCE 10 MG TABS tablet TAKE 1 TABLET(10 MG) BY MOUTH DAILY BEFORE BREAKFAST 30 tablet 2   levothyroxine (SYNTHROID) 100 MCG tablet Take 100 mcg by mouth daily before breakfast.     losartan (COZAAR) 50 MG tablet Take 50 mg by mouth daily.     Magnesium 250 MG TABS Take 250 mg by mouth at bedtime.     metoprolol tartrate (LOPRESSOR) 25 MG tablet TAKE 1 TABLET(25 MG) BY MOUTH TWICE DAILY (Patient taking differently: Take 25 mg by mouth in the morning and at bedtime.) 180 tablet 3   pantoprazole (PROTONIX) 40 MG tablet Take 1 tablet (40 mg total) by mouth daily before breakfast.     PARoxetine (PAXIL) 20 MG tablet Take 1 tablet (20 mg total) by mouth in the morning. 90 tablet 3   Semaglutide,0.25 or 0.5MG/DOS, (OZEMPIC, 0.25 OR 0.5 MG/DOSE,) 2 MG/1.5ML SOPN Inject  0.5 mg into the skin once a week. (Patient taking differently: Inject 0.5 mg into the skin every Monday.) 1.5 mL 6   traMADol (ULTRAM) 50 MG tablet Take 1 tablet (50 mg total) by mouth at bedtime as needed for up to 14 days. 14 tablet 0   traZODone (DESYREL) 50 MG tablet Take 0.5 tablets (25 mg total) by mouth at bedtime as needed for sleep. 30 tablet 6   valACYclovir (VALTREX)  1000 MG tablet Take 1 tablet (1,000 mg total) by mouth daily as needed (Fever Blisters). 180 tablet 0   No facility-administered medications prior to visit.   Allergies  Allergen Reactions   Codeine Nausea And Vomiting and Other (See Comments)    Severe stomach cramps, also   Antihistamines, Diphenhydramine-Type Other (See Comments)    Causes hyperactivity   Gabapentin     Urinary incontinence   Statins Other (See Comments)    Joint pains   Sulfa Antibiotics Other (See Comments)    Reaction not recalled   Erythromycin Hives, Rash and Other (See Comments)    Allergic due to dental work from 50 years ago. It was used in packing and resulted in rash/hives inside and outside of mouth.   Trulicity [Dulaglutide] Itching     Objective:   Today's Vitals   02/13/21 1129  BP: 124/78  Pulse: 76  Temp: 97.6 F (36.4 C)  TempSrc: Temporal  SpO2: 98%  Weight: 143 lb 3.2 oz (65 kg)  Height: '5\' 3"'  (1.6 m)   Body mass index is 25.37 kg/m.   General: Well developed, well nourished. No acute distress. HEENT: Normocephalic, non-traumatic. Mucous membranes moist. There are multiple white plaques on the   buccal mucosa, including the pretonsillar pillar, the tongue and the posterior oropharynx. There also appears   to be some hypertrophic papillae of the tongue. Neck: Supple. No lymphadenopathy. No thyromegaly. Lungs: Clear to auscultation bilaterally. No wheezing, rales or rhonchi. CV: RRR without murmurs or rubs. Pulses 2+ bilaterally. Psych: Alert and oriented. Normal mood and affect.  Health Maintenance Due   Topic Date Due   Hepatitis C Screening  Never done   TETANUS/TDAP  Never done   Zoster Vaccines- Shingrix (2 of 2) 03/29/2011     Assessment & Plan:   1. Acute epiglottitis without airway obstruction Reviewed hospital discharge summary. Ms. Sewell symptoms of epiglottitis have improved. She will finish her course of antibiotics. I will check labs as requested by the discharge physician.  - CBC - Comprehensive metabolic panel - Magnesium  2. Thrush Ms. Blades exam and symptoms are consistent with thrush. I suspect this occurred secondary to her recent antibiotics and steroid treatment. I will prescribe nystatin swish and swallow until improved.  - nystatin (MYCOSTATIN) 100000 UNIT/ML suspension; Take 5 mLs (500,000 Units total) by mouth 4 (four) times daily. Swish and swallow  Dispense: 60 mL; Refill: 0  [name], MD

## 2021-02-21 ENCOUNTER — Other Ambulatory Visit: Payer: Self-pay

## 2021-02-21 ENCOUNTER — Ambulatory Visit (INDEPENDENT_AMBULATORY_CARE_PROVIDER_SITE_OTHER): Payer: Medicare Other | Admitting: Nurse Practitioner

## 2021-02-21 VITALS — BP 130/72 | HR 78 | Temp 98.0°F | Resp 14 | Ht 63.0 in | Wt 146.4 lb

## 2021-02-21 DIAGNOSIS — R600 Localized edema: Secondary | ICD-10-CM | POA: Diagnosis not present

## 2021-02-21 LAB — CBC
HCT: 41.8 % (ref 36.0–46.0)
Hemoglobin: 13.5 g/dL (ref 12.0–15.0)
MCHC: 32.3 g/dL (ref 30.0–36.0)
MCV: 91.1 fl (ref 78.0–100.0)
Platelets: 187 10*3/uL (ref 150.0–400.0)
RBC: 4.6 Mil/uL (ref 3.87–5.11)
RDW: 15.6 % — ABNORMAL HIGH (ref 11.5–15.5)
WBC: 9 10*3/uL (ref 4.0–10.5)

## 2021-02-21 LAB — COMPREHENSIVE METABOLIC PANEL
ALT: 19 U/L (ref 0–35)
AST: 18 U/L (ref 0–37)
Albumin: 3.8 g/dL (ref 3.5–5.2)
Alkaline Phosphatase: 99 U/L (ref 39–117)
BUN: 37 mg/dL — ABNORMAL HIGH (ref 6–23)
CO2: 27 mEq/L (ref 19–32)
Calcium: 9.1 mg/dL (ref 8.4–10.5)
Chloride: 99 mEq/L (ref 96–112)
Creatinine, Ser: 1.7 mg/dL — ABNORMAL HIGH (ref 0.40–1.20)
GFR: 28.59 mL/min — ABNORMAL LOW (ref >60.00)
Glucose, Bld: 422 mg/dL — ABNORMAL HIGH (ref 70–99)
Potassium: 5.2 mEq/L — ABNORMAL HIGH (ref 3.5–5.1)
Sodium: 131 mEq/L — ABNORMAL LOW (ref 135–145)
Total Bilirubin: 0.5 mg/dL (ref 0.2–1.2)
Total Protein: 7 g/dL (ref 6.0–8.3)

## 2021-02-21 LAB — BRAIN NATRIURETIC PEPTIDE: Pro B Natriuretic peptide (BNP): 200 pg/mL — ABNORMAL HIGH (ref 0.0–100.0)

## 2021-02-21 NOTE — Progress Notes (Signed)
Date:  02/24/2021   ID:  [place], Seaborn [birth year], MRN [id number]  PCP:  [name], MD   [facility] Providers Cardiologist:  Johnsie Cancel  Patient Profile:    [name] is a 81 y.o. female with:  Coronary artery disease  Aortic stenosis S/p DES to RCA in 2007 Myoview 2017 low risk  S/p CABG + AVR 4/22 Hypertension  Diabetes mellitus  Chronic kidney disease  Hyperlipidemia  Breast CA  Peripheral arterial disease  S/p R SFA stent GERD Hypothyroidism  Carotid artery disease  S/p carotid stent in 2007 [place] 4/22: Bilat 1-39  Prior CV studies: Pre-CABG Dopplers 05/02/20 Bilateral ICA 1-39   RIGHT/LEFT HEART CATH AND CORONARY ANGIOGRAPHY 03/17/2020 Narrative  Ost RCA to Prox RCA lesion is 80% stenosed.  Mid RCA lesion is 30% stenosed.  RPDA lesion is 60% stenosed.  1st Mrg lesion is 50% stenosed.  Prox Cx to Mid Cx lesion is 80% stenosed.  Mid LAD lesion is 80% stenosed. 1. No significant disease in the left main artery 2. Severe mid LAD stenosis. The LAD bifurcated distally beyond this lesion and the diagonal branch reaches the apex. 3. Moderate caliber non-dominant Circumflex with severe mid stenosis. The vessel is relatively small beyond the stenosis. The small to moderate caliber obtuse marginal branch arises prior to the stenosis and has moderate ostial stenosis. 4. The RCA is a large dominant artery. There is severe, heavily calcified stenosis at the ostium of the RCA. The distal stented segment is patent. There is moderately severe stenosis in the moderate caliber PDA. 5. Severe aortic stenosis (mean gradient 26.9 mmHg, peak gradient 34 mmHg)  Echocardiogram 02/29/20 Moderate-severe aortic stenosis, V-max 3.5 m/s, mean gradient 33 mmHg, DI 0.26, moderate MR, EF 65-70, no RWMA, moderate LVH, normal RVSF  GATED SPECT MYO PERF W/EXERCISE STRESS 1D 11/22/2015 Narrative  Nuclear stress EF: 59%. Normal wall motion  There was no ST segment  deviation noted during stress.  This is a low risk study. No perfusion defects, no ischemia   HPI:   81 y.o. with CAD and now post CABG/AVR  03/17/20 Cardiac catheterization demonstrated severe disease in the LAD and RCA.  There was also severe disease in a very small LCx.  She was referred to Dr. Roxy Manns for CABG+AVR. She was admitted 4/13-4/18 and underwent CABG (L-LAD, S-PDA) + bioprosthetic AVR.  Circumflex / OM  too small to graft The post op course was fairly uneventful.  She returns for f/u.  She has felt poorly She stopped her repatha and lasix on her own   Post op TTE 06/15/20 with EF 60-65% moderate MR normal AVR with no AR and mean gradient 4 peak 6.7 mmHg DVI 0.56.    Follows with  Dr Donzetta Matters [facility]  Plan to observe LLE moderate PVD for now   Right ABI 0.65 and left 0.80 09/09/20   Feels miserable Had epiglottis recently Also COVID. Has been on prednisone which made her BS go up Worse neuropathy in her feet Had podiatry trim nail in left great toe and it is slow to heal.       Past Medical History:  Diagnosis Date   Allergy    Anemia    childhood   Anxiety    Aortic stenosis    Arterial stenosis (HCC)    mesenteric   Arthritis    Breast cancer ([place]) 12/05/10   R breast, inv mammary, in situ,ER/PR +,HER2 -   Cancer ([place])  Carcinoma of breast treated with adjuvant chemotherapy ([place])    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[place])    Claudication ([place])    Coronary artery disease    stent 2007   Depression    Diabetes mellitus    Difficulty sleeping    Diverticulosis 12/10/03   Elevated cholesterol    Generalized weakness    GERD (gastroesophageal reflux disease)    Heart murmur    Hepatitis    as an infant   HSV-1 (herpes simplex virus 1) infection    Hyperlipidemia    Hypertension    Hypothyroidism    Osteoarthritis    PAD (peripheral artery disease) ([place])    Personal history of chemotherapy    Personal history of radiation therapy    Pneumonia    2014  september and March 2022   Rheumatoid arthritis ([place])    S/P aortic valve replacement with bioprosthetic valve 05/04/2020   Edwards Inspiris Resilia stented bovine pericardial tissue valve, size 23 mm   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   LIMA to D3, SVG to PDA, EVH via right thigh   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   aspirin EC 81 MG tablet Take 81 mg by mouth in the morning.   blood glucose meter kit and supplies Dispense based on patient and insurance preference. Use up to four times daily as directed. (FOR ICD-10 E10.9, E11.9).   Blood Glucose Monitoring Suppl DEVI by Does not apply route. Accu Check Guide   Cholecalciferol (VITAMIN D3) 25 MCG (1000 UT) CAPS Take 2,000 Units by mouth daily before lunch.   Evolocumab (REPATHA SURECLICK) 756 MG/ML SOAJ Inject 1 pen into the skin every 14 (fourteen) days.   glipiZIDE (GLUCOTROL XL) 10 MG 24 hr tablet Take 1 tablet (10 mg total) by mouth daily with breakfast.   glucose blood (ACCU-CHEK GUIDE) test strip TEST ONCE DAILY   glucose blood test strip 1 each by Other route as needed for other. Use as instructed  Please dispense Accu check Guide Test strips   Golimumab (SIMPONI ARIA IV) Inject into the vein every 2 (two) months.   icosapent Ethyl (VASCEPA) 1 g capsule Take 2 capsules (2 g total) by mouth 2 (two) times daily.   insulin glargine (LANTUS) 100 UNIT/ML Solostar Pen Inject 10 Units into the skin daily.   JARDIANCE 10 MG TABS tablet TAKE 1 TABLET(10 MG) BY MOUTH DAILY BEFORE BREAKFAST   levothyroxine (SYNTHROID) 100 MCG tablet Take 100 mcg by mouth daily before breakfast.   losartan (COZAAR) 50 MG tablet Take 50 mg by mouth daily.   Magnesium 250  MG TABS Take 250 mg by mouth at bedtime.   metoprolol tartrate (LOPRESSOR) 25 MG tablet TAKE 1 TABLET(25 MG) BY MOUTH TWICE DAILY   nystatin (MYCOSTATIN) 100000 UNIT/ML suspension Take 5 mLs (500,000 Units total) by mouth 4 (four) times daily. Swish and swallow   pantoprazole (PROTONIX) 40 MG tablet Take 1 tablet (40 mg total) by mouth daily before breakfast.   PARoxetine (PAXIL) 20 MG tablet Take 1 tablet (20 mg total) by mouth in the morning.   Semaglutide,0.25 or  0.5MG/DOS, (OZEMPIC, 0.25 OR 0.5 MG/DOSE,) 2 MG/1.5ML SOPN Inject 0.5 mg into the skin once a week.   traZODone (DESYREL) 50 MG tablet Take 0.5 tablets (25 mg total) by mouth at bedtime as needed for sleep.   valACYclovir (VALTREX) 1000 MG tablet Take 1 tablet (1,000 mg total) by mouth daily as needed (Fever Blisters).   [DISCONTINUED] amitriptyline (ELAVIL) 10 MG tablet Take 1 tablet (10 mg total) by mouth at bedtime as needed for sleep.     Allergies:   Codeine; Antihistamines, diphenhydramine-type; Gabapentin; Statins; Sulfa antibiotics; Erythromycin; and Trulicity [dulaglutide]   Social History   Tobacco Use   Smoking status: Former    Packs/day: 2.00    Years: 40.00    Pack years: 80.00    Types: Cigarettes    Quit date: 12/19/1997    Years since quitting: 23.2   Smokeless tobacco: Never  Vaping Use   Vaping Use: Never used  Substance Use Topics   Alcohol use: Yes    Alcohol/week: 1.0 standard drink    Types: 1 Glasses of wine per week    Comment: occasiona - maybe once a month   Drug use: No     Family Hx: The patient's family history includes Alcoholism in her father; Cancer in her son; Diabetes in her brother; Heart attack in her brother and father; Heart disease in her mother; Hypertension in her brother; Kidney disease in her mother; Lymphoma (age of onset: 60) in her brother; Throat cancer in her father. There is no history of Colon cancer, Stroke, Esophageal cancer, Rectal cancer, or Stomach  cancer.  Review of Systems  Constitutional: Negative for decreased appetite and fever.  Respiratory:  Negative for cough.     EKGs/Labs/Other Test Reviewed:    EKG:   SR LVH with strain ? Old IMI 05/30/20   Recent Labs: 02/02/2021: TSH 18.50 02/13/2021: Magnesium 1.8 02/21/2021: ALT 19; BUN 37; Creatinine, Ser 1.70; Hemoglobin 13.5; Platelets 187.0; Potassium 5.2; Pro B Natriuretic peptide (BNP) 200.0; Sodium 131   Recent Lipid Panel Lab Results  Component Value Date/Time   CHOL 125 02/09/2021 08:16 AM   TRIG 238 (H) 02/09/2021 08:16 AM   HDL 43 02/09/2021 08:16 AM   CHOLHDL 2.9 02/09/2021 08:16 AM   CHOLHDL 6 11/02/2020 09:40 AM   LDLCALC 45 02/09/2021 08:16 AM   LDLDIRECT 172.0 11/02/2020 09:40 AM      Risk Assessment/Calculations:      Physical Exam:    VS:  BP (!) 150/68    Pulse 80    Ht '5\' 3"'  (1.6 m)    Wt 147 lb (66.7 kg)    SpO2 97%    BMI 26.04 kg/m     Wt Readings from Last 3 Encounters:  02/24/21 147 lb (66.7 kg)  02/22/21 146 lb (66.2 kg)  02/21/21 146 lb 6 oz (66.4 kg)     Affect appropriate Healthy:  appears stated age HEENT: normal Neck supple with no adenopathy JVP normal no bruits no thyromegaly Lungs clear with no wheezing and good diaphragmatic motion Heart:  S1/S2 SEM through AVR no AR post sternotomy  Abdomen: benighn, BS positve, no tenderness, no AAA no bruit.  No HSM or HJR Distal pulses intact with no bruits Plus one pedal edema Neuro non-focal Decreased pedal pulses Post nail trimming left great toe erythema Joint pain ? Gout         ASSESSMENT & PLAN:    CAD/CABG:  05/04/20 LIMA to Diagonal and SVG to PdA continue  ASA And beta blocker AVR:  bicuspid pathology post 23 mm Edwards Inspiris Resilia stented bovine pericardial tissue valve Post op echo 06/15/20 normal function  HLD:  Intolerant to statins back on Repatha LDL 45 improved  DM:  Discussed low carb diet.  Target hemoglobin A1c is 6.5 or less.  Continue current  medications. HTN:  Well controlled.  Continue current medications and low sodium Dash type diet.   Thyroid : on synthroid replacement check labs  PVD:  Previous right SFA stenting ABI"s 0.74 bilaterally 05/02/20 prior to AVR/CABG Most recent ABI 09/09/20 right 0.65 and left 0.80 f/u with [facility] MR:  TTE 06/15/20 AVR normal EF normal moderate MR f/u echo May 2023 on Lasix 20 mg daily  Neuropathy/Foot pain:  d/c elavil Start lyrica.  Cr around 1.7 so dose Lyrica 74 mg bid F/U primary Her left foot pain seems more related to possible gout. Check plain film xray and start colchicine F/U with primary Don't think her circulation has changed much and she will make appointment with [facility]    Dispo:   F/U in 3 months   Time:  reviewing CABG/AVR report post op TTE, ABI"s labs direct patient interview and composing note 25 minutes   Tests Ordered:  Echo May 2023   Medication Changes: No orders of the defined types were placed in this encounter.     Signed, [name], MD  02/24/2021 10:17 AM    [facility] [address], [place], [place]  [address] Phone: [phone]; Fax: [phone]

## 2021-02-21 NOTE — Assessment & Plan Note (Signed)
Patient presents to clinic for concern of bilateral lower extremity swelling.  Recently started yesterday 1 foot and then the right foot today no pain outside of patient's normal neuropathic pain.  Saw historically having furosemide 20 mg on medicine list patient does not have on today's medicine list states she has at home to take his as needed Lasix with several months ago last BMP was askew with sugar and kidney function will wait updated lab prior to deciding for her to take Lasix for few days.  Did note that she had a 3 pound increase since her last office visit.  Did review echo done in May 2022 EF was normal.  Pending lab results. Of note patient does have peripheral peripheral vascular disease left leg unable to be stented right leg was able to be stented.  This likely has some play in patient's symptoms

## 2021-02-21 NOTE — Patient Instructions (Signed)
Nice to see you today Follow up with Dr. Gena Fray as scheduled He will be able to see the labs I draw today Depending on the blood work I will let you know about taking a fluid pill that you have

## 2021-02-21 NOTE — Progress Notes (Signed)
Acute Office Visit  Subjective:    Patient ID: [name], female    DOB: 1940-03-31, 81 y.o.   MRN: [id number]  Chief Complaint  Patient presents with   Foot Swelling    Left foot swelling on 02/20/21, right foot started today. Patient has hx of heart surgery in 2022 and wanted to make sure she was not getting an infection in her feet.    HPI Patient is in today for feet swelling  States that her left foot started swelling yesterday and she noted that her right foot started swelling. States that she has to have her feet down to help with neuropathic pain. States it she elevates the left one the pain worsens (likely to her PVD). States that she had her left great toe nail worked on approx 4 weeks ago had a follow up last week. Has ben soaking it in epsom salt. No new pain or redness to feet. She does have a history of CABG x2 and a valve replacement. Also has a history of PVD with stenting to right side no stenting to left side. She is followed by Dr. Donzetta Matters (vascular) and Dr. 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[place])    Claudication ([place])    Coronary artery disease    stent 2007   Depression    Diabetes mellitus    Difficulty sleeping    Diverticulosis 12/10/03   Elevated cholesterol    Generalized weakness    GERD (gastroesophageal reflux disease)    Heart murmur    Hepatitis    as an infant   HSV-1 (herpes simplex virus 1) infection    Hyperlipidemia    Hypertension    Hypothyroidism    Osteoarthritis    PAD (peripheral artery disease) ([place])    Personal history of chemotherapy    Personal history of radiation therapy     Pneumonia    2014 september and March 2022   Rheumatoid arthritis ([place])    S/P aortic valve replacement with bioprosthetic valve 05/04/2020   [facility] Inspiris Resilia stented bovine pericardial tissue valve, size 23 mm   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   LIMA to D3, SVG to PDA, EVH via right thigh   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   Dr [name]   Spinal stenosis    Thyroid disease     Past Surgical History:  Procedure Laterality Date   ABDOMINAL AORTOGRAM W/LOWER EXTREMITY Bilateral 04/29/2019   Procedure: ABDOMINAL AORTOGRAM W/LOWER EXTREMITY;  Surgeon: Marty Heck, MD;  Location: [facility];  Service: Cardiovascular;  Laterality: Bilateral;   ABDOMINAL AORTOGRAM W/LOWER EXTREMITY Left 07/01/2019   Procedure: ABDOMINAL AORTOGRAM W/ Left LOWER EXTREMITY [place];  Surgeon: Marty Heck, MD;  Location: [facility];  Service: Cardiovascular;  Laterality: Left;   ANTERIOR AND POSTERIOR REPAIR N/A 04/05/2016   Procedure: ANTERIOR (CYSTOCELE) AND POSTERIOR REPAIR (RECTOCELE);  Surgeon: [name], MD;  Location: [facility];  Service: Gynecology;  Laterality: N/A;   AORTIC VALVE REPLACEMENT N/A 05/04/2020  Procedure: AORTIC VALVE REPLACEMENT (AVR) USING INSPIRIS 23MM RESILIA AORTIC VALVE;  Surgeon: Rexene Alberts, MD;  Location: [facility];  Service: Open Heart Surgery;  Laterality: N/A;   BREAST BIOPSY     BREAST LUMPECTOMY  1983   benign biopsy   BREAST LUMPECTOMY Right 12/29/2010   snbx, ER/PR +, Her2 -, 0/1 node pos.   CARPAL TUNNEL RELEASE Bilateral 9/12,8,12   CHOLECYSTECTOMY     COLONOSCOPY N/A 02/09/2013   Procedure: COLONOSCOPY;  Surgeon: [name], MD;  Location: [facility];  Service: Endoscopy;  Laterality: N/A;   COLONOSCOPY     CORONARY ANGIOPLASTY     CORONARY ARTERY BYPASS GRAFT N/A 05/04/2020   Procedure: CORONARY ARTERY BYPASS GRAFTING (CABG), ON PUMP, TIMES TWO, USING LEFT INTERNAL MAMMARY ARTERY AND RIGHT ENDOSCOPICALLY HARVESTED GREATER  SAPHENOUS VEIN;  Surgeon: Rexene Alberts, MD;  Location: [facility];  Service: Open Heart Surgery;  Laterality: N/A;   DILATION AND CURETTAGE OF UTERUS     ESOPHAGOGASTRODUODENOSCOPY N/A 02/09/2013   Procedure: ESOPHAGOGASTRODUODENOSCOPY (EGD);  Surgeon: [name], MD;  Location: [name];  Service: Endoscopy;  Laterality: N/A;   FOOT SPURS     HYSTEROSCOPY WITH D & C N/A 09/11/2012   Procedure: DILATATION AND CURETTAGE ;  Surgeon: [name], MD;  Location: [facility];  Service: Gynecology;  Laterality: N/A;   KNEE SURGERY  2007   LAPAROSCOPIC ASSISTED VAGINAL HYSTERECTOMY N/A 04/05/2016   Procedure: LAPAROSCOPIC ASSISTED VAGINAL HYSTERECTOMY possible BSO;  Surgeon: [name], MD;  Location: [facility];  Service: Gynecology;  Laterality: N/A;   LUMBAR LAMINECTOMY/DECOMPRESSION MICRODISCECTOMY N/A 10/20/2014   Procedure: MICRO LUMBER DECOMPRESSION L3-4 L4-5;  Surgeon: [name], MD;  Location: [facility];  Service: Orthopedics;  Laterality: N/A;   LUMBAR LAMINECTOMY/DECOMPRESSION MICRODISCECTOMY Bilateral 10/13/2015   Procedure: MICRO LUMBAR DECOMPRESSION L5 - S1 AND REDO DECOMPRESSION L4 - L5 AND REMOVAL OF FACET CYST L4 - L5 2 LEVELS;  Surgeon: [name], MD;  Location: [facility];  Service: Orthopedics;  Laterality: Bilateral;   PERIPHERAL VASCULAR INTERVENTION Right 04/29/2019   Procedure: PERIPHERAL VASCULAR INTERVENTION;  Surgeon: Marty Heck, MD;  Location: [facility];  Service: Cardiovascular;  Laterality: Right;   PORT-A-CATH REMOVAL  04/17/2011   Procedure: REMOVAL PORT-A-CATH;  Surgeon: [name], MD;  Location: [facility];  Service: General;  Laterality: Left;   PORTACATH PLACEMENT  02/07/2011   Procedure: INSERTION PORT-A-CATH;  Surgeon: [name], MD;  Location: [facility];  Service: General;  Laterality: N/A;   RIGHT/LEFT HEART CATH AND CORONARY ANGIOGRAPHY N/A 03/17/2020   Procedure: RIGHT/LEFT HEART CATH AND CORONARY ANGIOGRAPHY;   Surgeon: Burnell Blanks, MD;  Location: [facility];  Service: Cardiovascular;  Laterality: N/A;   TEE WITHOUT CARDIOVERSION N/A 05/04/2020   Procedure: TRANSESOPHAGEAL ECHOCARDIOGRAM (TEE);  Surgeon: Rexene Alberts, MD;  Location: [facility];  Service: Open Heart Surgery;  Laterality: N/A;   UPPER GASTROINTESTINAL ENDOSCOPY      Family History  Problem Relation Age of Onset   Kidney disease Mother    Heart disease Mother    Throat cancer Father    Alcoholism Father    Heart attack Father    Lymphoma Brother 57   Heart attack Brother    Diabetes Brother    Hypertension Brother    Cancer Son    Colon cancer Neg Hx    Stroke Neg Hx    Esophageal cancer Neg Hx    Rectal cancer Neg Hx  Stomach cancer Neg Hx     Social History   Socioeconomic History   Marital status: Married    Spouse name: Not on file   Number of children: 3   Years of education: Not on file   Highest education level: Not on file  Occupational History   Occupation: retired-bookkeeping  Tobacco Use   Smoking status: Former    Packs/day: 2.00    Years: 40.00    Pack years: 80.00    Types: Cigarettes    Quit date: 12/19/1997    Years since quitting: 23.1   Smokeless tobacco: Never  Vaping Use   Vaping Use: Never used  Substance and Sexual Activity   Alcohol use: Yes    Alcohol/week: 1.0 standard drink    Types: 1 Glasses of wine per week    Comment: occasiona - maybe once a month   Drug use: No   Sexual activity: Not Currently    Comment: menarch age 45, P52, HRT X 10 YRS, MENOPAUSE MID 40'S  Other Topics Concern   Not on file  Social History Narrative   One daughter died from suicide at age 39.   Social Determinants of Health   Financial Resource Strain: Low Risk    Difficulty of Paying Living Expenses: Not hard at all  Food Insecurity: No Food Insecurity   Worried About Charity fundraiser in the Last Year: Never true   [place] in the Last Year: Never true   Transportation Needs: No Transportation Needs   Lack of Transportation (Medical): No   Lack of Transportation (Non-Medical): No  Physical Activity: Insufficiently Active   Days of Exercise per Week: 2 days   Minutes of Exercise per Session: 20 min  Stress: No Stress Concern Present   Feeling of Stress : Only a little  Social Connections: Moderately Integrated   Frequency of Communication with Friends and Family: Three times a week   Frequency of Social Gatherings with Friends and Family: Twice a week   Attends Religious Services: More than 4 times per year   Active Member of [employer] or Organizations: No   Attends Music therapist: Never   Marital Status: Married  Human resources officer Violence: Not At Risk   Fear of Current or Ex-Partner: No   Emotionally Abused: No   Physically Abused: No   Sexually Abused: No    Outpatient Medications Prior to Visit  Medication Sig Dispense Refill   Accu-Chek FastClix Lancets MISC USE TO CHECK BLOOD SUGARS 3 TIMES DAILY 306 each 3   acetaminophen (TYLENOL) 500 MG tablet Take 500 mg by mouth every 6 (six) hours as needed for mild pain or headache.     amitriptyline (ELAVIL) 10 MG tablet Take 1 tablet (10 mg total) by mouth at bedtime as needed for sleep.     amoxicillin (AMOXIL) 500 MG capsule Take 2,000 mg by mouth See admin instructions. Take 2,000 mg by mouth one hour prior to dental treatment     aspirin EC 81 MG tablet Take 81 mg by mouth in the morning.     blood glucose meter kit and supplies Dispense based on patient and insurance preference. Use up to four times daily as directed. (FOR ICD-10 E10.9, E11.9). 1 each 0   Blood Glucose Monitoring Suppl DEVI by Does not apply route. Accu Check Guide     Cholecalciferol (VITAMIN D3) 25 MCG (1000 UT) CAPS Take 2,000 Units by mouth daily before lunch.  Evolocumab (REPATHA SURECLICK) 517 MG/ML SOAJ Inject 1 pen into the skin every 14 (fourteen) days. 2 mL 11   glipiZIDE (GLUCOTROL XL) 10  MG 24 hr tablet Take 1 tablet (10 mg total) by mouth daily with breakfast. 90 tablet 3   glucose blood (ACCU-CHEK GUIDE) test strip TEST ONCE DAILY 100 each 5   glucose blood test strip 1 each by Other route as needed for other. Use as instructed  Please dispense Accu check Guide Test strips     Golimumab (SIMPONI ARIA IV) Inject into the vein every 2 (two) months.     icosapent Ethyl (VASCEPA) 1 g capsule Take 2 capsules (2 g total) by mouth 2 (two) times daily. (Patient taking differently: Take 2 g by mouth in the morning and at bedtime.) 120 capsule 11   JARDIANCE 10 MG TABS tablet TAKE 1 TABLET(10 MG) BY MOUTH DAILY BEFORE BREAKFAST 30 tablet 2   levothyroxine (SYNTHROID) 100 MCG tablet Take 100 mcg by mouth daily before breakfast.     losartan (COZAAR) 50 MG tablet Take 50 mg by mouth daily.     Magnesium 250 MG TABS Take 250 mg by mouth at bedtime.     metoprolol tartrate (LOPRESSOR) 25 MG tablet TAKE 1 TABLET(25 MG) BY MOUTH TWICE DAILY (Patient taking differently: Take 25 mg by mouth in the morning and at bedtime.) 180 tablet 3   nystatin (MYCOSTATIN) 100000 UNIT/ML suspension Take 5 mLs (500,000 Units total) by mouth 4 (four) times daily. Swish and swallow 60 mL 0   pantoprazole (PROTONIX) 40 MG tablet Take 1 tablet (40 mg total) by mouth daily before breakfast.     PARoxetine (PAXIL) 20 MG tablet Take 1 tablet (20 mg total) by mouth in the morning. 90 tablet 3   Semaglutide,0.25 or 0.5MG/DOS, (OZEMPIC, 0.25 OR 0.5 MG/DOSE,) 2 MG/1.5ML SOPN Inject 0.5 mg into the skin once a week. (Patient taking differently: Inject 0.5 mg into the skin every Monday.) 1.5 mL 6   traZODone (DESYREL) 50 MG tablet Take 0.5 tablets (25 mg total) by mouth at bedtime as needed for sleep. 30 tablet 6   valACYclovir (VALTREX) 1000 MG tablet Take 1 tablet (1,000 mg total) by mouth daily as needed (Fever Blisters). 180 tablet 0   No facility-administered medications prior to visit.    Allergies  Allergen  Reactions   Codeine Nausea And Vomiting and Other (See Comments)    Severe stomach cramps, also   Antihistamines, Diphenhydramine-Type Other (See Comments)    Causes hyperactivity   Gabapentin     Urinary incontinence   Statins Other (See Comments)    Joint pains   Sulfa Antibiotics Other (See Comments)    Reaction not recalled   Erythromycin Hives, Rash and Other (See Comments)    Allergic due to dental work from 50 years ago. It was used in packing and resulted in rash/hives inside and outside of mouth.   Trulicity [Dulaglutide] Itching    Review of Systems  Constitutional:  Negative for chills and fever.  Respiratory:  Negative for cough and shortness of breath.   Cardiovascular:  Positive for leg swelling. Negative for chest pain.  Gastrointestinal:  Negative for diarrhea, nausea and vomiting.  Neurological:  Negative for numbness.      Objective:    Physical Exam Vitals and nursing note reviewed.  Constitutional:      Appearance: Normal appearance.  Cardiovascular:     Rate and Rhythm: Normal rate and regular rhythm.  Pulses:          Dorsalis pedis pulses are 1+ on the right side and 1+ on the left side.     Heart sounds: Normal heart sounds.  Pulmonary:     Effort: Pulmonary effort is normal.     Breath sounds: Normal breath sounds.  Musculoskeletal:     Right lower leg: 1+ Pitting Edema present.     Left lower leg: 1+ Pitting Edema present.  Skin:    Capillary Refill: Capillary refill takes 2 to 3 seconds.          Comments: Left forefoot ruddy color. Not warm to touch or pain with palpation.  Right foot paler color. Pulse and cap refill equal bilaterally  Neurological:     Mental Status: She is alert.    BP 130/72 (Patient Position: Sitting, Cuff Size: Large)    Pulse 78    Temp 98 F (36.7 C)    Resp 14    Ht '5\' 3"'  (1.6 m)    Wt 146 lb 6 oz (66.4 kg)    SpO2 97%    BMI 25.93 kg/m  Wt Readings from Last 3 Encounters:  02/21/21 146 lb 6 oz (66.4 kg)   02/13/21 143 lb 3.2 oz (65 kg)  02/07/21 150 lb 11.2 oz (68.4 kg)    Health Maintenance Due  Topic Date Due   Hepatitis C Screening  Never done   TETANUS/TDAP  Never done   Zoster Vaccines- Shingrix (2 of 2) 03/29/2011    There are no preventive care reminders to display for this patient.   Lab Results  Component Value Date   TSH 18.50 (H) 02/02/2021   Lab Results  Component Value Date   WBC 11.7 (H) 02/13/2021   HGB 13.8 02/13/2021   HCT 42.6 02/13/2021   MCV 90.4 02/13/2021   PLT 269.0 02/13/2021   Lab Results  Component Value Date   NA 132 (L) 02/13/2021   K 5.5 No hemolysis seen (H) 02/13/2021   CHLORIDE 104 02/21/2016   CO2 23 02/13/2021   GLUCOSE 527 (HH) 02/13/2021   BUN 48 (H) 02/13/2021   CREATININE 1.71 (H) 02/13/2021   BILITOT 0.6 02/13/2021   ALKPHOS 96 02/13/2021   AST 14 02/13/2021   ALT 13 02/13/2021   PROT 7.1 02/13/2021   ALBUMIN 3.8 02/13/2021   CALCIUM 9.8 02/13/2021   ANIONGAP 10 02/07/2021   EGFR 29 (L) 10/26/2020   GFR 28.40 (L) 02/13/2021   Lab Results  Component Value Date   CHOL 125 02/09/2021   Lab Results  Component Value Date   HDL 43 02/09/2021   Lab Results  Component Value Date   LDLCALC 45 02/09/2021   Lab Results  Component Value Date   TRIG 238 (H) 02/09/2021   Lab Results  Component Value Date   CHOLHDL 2.9 02/09/2021   Lab Results  Component Value Date   HGBA1C 10.5 (H) 02/02/2021       Assessment & Plan:   Problem List Items Addressed This Visit       Other   Bilateral lower extremity edema - Primary    Patient presents to clinic for concern of bilateral lower extremity swelling.  Recently started yesterday 1 foot and then the right foot today no pain outside of patient's normal neuropathic pain.  Saw historically having furosemide 20 mg on medicine list patient does not have on today's medicine list states she has at home to take his as needed Lasix with  several months ago last BMP was askew with  sugar and kidney function will wait updated lab prior to deciding for her to take Lasix for few days.  Did note that she had a 3 pound increase since her last office visit.  Did review echo done in May 2022 EF was normal.  Pending lab results. Of note patient does have peripheral peripheral vascular disease left leg unable to be stented right leg was able to be stented.  This likely has some play in patient's symptoms      Relevant Orders   CBC   Brain natriuretic peptide   Comprehensive metabolic panel     No orders of the defined types were placed in this encounter.  This visit occurred during the SARS-CoV-2 public health emergency.  Safety protocols were in place, including screening questions prior to the visit, additional usage of staff PPE, and extensive cleaning of exam room while observing appropriate contact time as indicated for disinfecting solutions.    Romilda Garret, NP

## 2021-02-22 ENCOUNTER — Ambulatory Visit (INDEPENDENT_AMBULATORY_CARE_PROVIDER_SITE_OTHER): Payer: Medicare Other | Admitting: Family Medicine

## 2021-02-22 VITALS — BP 142/78 | HR 75 | Temp 97.0°F | Ht 63.0 in | Wt 146.0 lb

## 2021-02-22 DIAGNOSIS — B370 Candidal stomatitis: Secondary | ICD-10-CM

## 2021-02-22 DIAGNOSIS — R600 Localized edema: Secondary | ICD-10-CM | POA: Diagnosis not present

## 2021-02-22 DIAGNOSIS — E1165 Type 2 diabetes mellitus with hyperglycemia: Secondary | ICD-10-CM

## 2021-02-22 MED ORDER — NYSTATIN 100000 UNIT/ML MT SUSP: 5.0000 mL | Freq: Four times a day (QID) | OROMUCOSAL | 0 refills | Status: DC

## 2021-02-22 MED ORDER — INSULIN GLARGINE 100 UNIT/ML SOLOSTAR PEN: 10.0000 [IU] | PEN_INJECTOR | Freq: Every day | SUBCUTANEOUS | 1 refills | Status: DC

## 2021-02-22 NOTE — Patient Instructions (Addendum)
Start taking the furosemide (Lasix) that you have at home once daily. Keep appointment with Dr. Johnsie Cancel for 2/3/2-23 Continue to monitor fasting blood sugars. Start insulin glargine (Lantus) 10 units at bedtime.

## 2021-02-22 NOTE — Progress Notes (Signed)
[facility] [address] [place] [address] Dept: [phone] Dept Fax: [phone]  Office Visit  Subjective:    Patient ID: [name], female    DOB: 1941-01-04, 81 y.o..   MRN: [id number]  Chief Complaint  Patient presents with   Follow-up    1 week f/u and discuss labs done yesterday.  Average fasting BS in AM  209- 340.     History of Present Illness:  Patient is in today for reassessment of her recent swelling of her legs. She was seen yesterday by Mr. Cable for assessment of this. She has not noted any increased breathlessness. Ms. Kirkes has ahd a prior aortic valve replacement. She was on Lasix for a period of time and now has this at home should she need it.  Ms. Bunte was recently treated for thrush, which developed after being on antibiotics and prednisone for supraglottitis. I treated her with a course of nystatin. She notes this did improve temporarily, but she feels her tongue is back to giving her trouble again.  Ms. Mcclatchy has a history of Type 2 diabetes. She is managed on glipizide, empaglifozin (Jardiance) and semaglutide (Ozempic). She notes she had only recently taken the Ozempic and part of the dose was wasted (ran out on the skin). She has been checking her fasting glucoses. Her 7-day average has been 272.  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   Primary insomnia 11/02/2020   Irritation of eyelid 07/26/2020   S/P aortic valve replacement with bioprosthetic valve 05/04/2020   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   PAD (peripheral artery disease) (HCC)    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[place])  04/07/2019   Low back pain 02/03/2019   Scoliosis deformity of spine 02/03/2019   Osteoarthritis of hip 11/17/2018   Claudication ([place])    Chronic headaches 11/12/2017   Degeneration of lumbar intervertebral disc 06/13/2017   Hyperkalemia 06/03/2017   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[place])    Mass of left adrenal gland ([place]) 04/19/2017   Spinal stenosis of lumbar region 10/20/2014   Hyperlipidemia 03/16/2013   Dense breasts 12/22/2012   Anxiety state 04/16/2012   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/16/2012   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   Osteoarthritis of knee 04/16/2012   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 female breast ([place]) 12/20/2010   History of breast cancer 12/05/2010   Depression 09/14/2008   GERD 09/14/2008   Type 2 diabetes mellitus with diabetic neuropathy, without long-term current use of insulin ([place]) 05/20/2008   Essential hypertension 04/17/2007   Coronary atherosclerosis 04/17/2007   Hypothyroidism 04/03/2007   Past Surgical History:  Procedure Laterality Date   ABDOMINAL AORTOGRAM W/LOWER EXTREMITY Bilateral 04/29/2019   Procedure: ABDOMINAL AORTOGRAM W/LOWER EXTREMITY;  Surgeon: Marty Heck, MD;  Location: [facility];  Service: Cardiovascular;  Laterality: Bilateral;   ABDOMINAL AORTOGRAM W/LOWER EXTREMITY Left 07/01/2019   Procedure: ABDOMINAL AORTOGRAM W/ Left LOWER EXTREMITY [place];  Surgeon: Marty Heck, MD;  Location: [facility];  Service: Cardiovascular;  Laterality: Left;   ANTERIOR AND POSTERIOR REPAIR N/A 04/05/2016   Procedure: ANTERIOR (CYSTOCELE) AND POSTERIOR REPAIR (RECTOCELE);  Surgeon: [name], MD;  Location: [facility];  Service: Gynecology;  Laterality: N/A;   AORTIC VALVE REPLACEMENT N/A 05/04/2020   Procedure: AORTIC VALVE REPLACEMENT (AVR) USING INSPIRIS 23MM RESILIA AORTIC VALVE;  Surgeon: Rexene Alberts, MD;  Location: [facility];  Service: Open Heart Surgery;   Laterality: N/A;   BREAST BIOPSY     BREAST LUMPECTOMY  1983   benign biopsy   BREAST LUMPECTOMY Right 12/29/2010   snbx, ER/PR +, Her2 -, 0/1 node pos.   CARPAL TUNNEL RELEASE Bilateral 9/12,8,12   CHOLECYSTECTOMY     COLONOSCOPY N/A 02/09/2013   Procedure: COLONOSCOPY;  Surgeon: [name], MD;  Location: [facility];  Service: Endoscopy;  Laterality: N/A;   COLONOSCOPY     CORONARY ANGIOPLASTY     CORONARY ARTERY BYPASS GRAFT N/A 05/04/2020   Procedure: CORONARY ARTERY BYPASS GRAFTING (CABG), ON PUMP, TIMES TWO, USING LEFT INTERNAL MAMMARY ARTERY AND RIGHT ENDOSCOPICALLY HARVESTED GREATER SAPHENOUS VEIN;  Surgeon: Rexene Alberts, MD;  Location: [facility];  Service: Open Heart Surgery;  Laterality: N/A;   DILATION AND CURETTAGE OF UTERUS     ESOPHAGOGASTRODUODENOSCOPY N/A 02/09/2013   Procedure: ESOPHAGOGASTRODUODENOSCOPY (EGD);  Surgeon: [name], MD;  Location: [name];  Service: Endoscopy;  Laterality: N/A;   FOOT SPURS     HYSTEROSCOPY WITH D & C N/A 09/11/2012   Procedure: DILATATION AND CURETTAGE ;  Surgeon: [name], MD;  Location: [facility];  Service: Gynecology;  Laterality: N/A;   KNEE SURGERY  2007   LAPAROSCOPIC ASSISTED VAGINAL HYSTERECTOMY N/A 04/05/2016   Procedure: LAPAROSCOPIC ASSISTED VAGINAL HYSTERECTOMY possible BSO;  Surgeon: [name], MD;  Location: [facility];  Service: Gynecology;  Laterality: N/A;   LUMBAR LAMINECTOMY/DECOMPRESSION MICRODISCECTOMY N/A 10/20/2014   Procedure: MICRO LUMBER DECOMPRESSION L3-4 L4-5;  Surgeon: [name], MD;  Location: [facility];  Service: Orthopedics;  Laterality: N/A;   LUMBAR LAMINECTOMY/DECOMPRESSION MICRODISCECTOMY Bilateral 10/13/2015   Procedure: MICRO LUMBAR DECOMPRESSION L5 - S1 AND REDO DECOMPRESSION L4 - L5 AND REMOVAL OF FACET CYST L4 - L5 2 LEVELS;  Surgeon: [name], MD;  Location: [facility];  Service: Orthopedics;  Laterality: Bilateral;   PERIPHERAL VASCULAR INTERVENTION Right 04/29/2019   Procedure:  PERIPHERAL VASCULAR INTERVENTION;  Surgeon: Marty Heck, MD;  Location: [facility];  Service: Cardiovascular;  Laterality: Right;   PORT-A-CATH REMOVAL  04/17/2011   Procedure: REMOVAL PORT-A-CATH;  Surgeon: [name], MD;  Location: [facility];  Service: General;  Laterality: Left;   PORTACATH PLACEMENT  02/07/2011   Procedure: INSERTION PORT-A-CATH;  Surgeon: [name], MD;  Location: [facility];  Service: General;  Laterality: N/A;   RIGHT/LEFT HEART CATH AND CORONARY ANGIOGRAPHY N/A 03/17/2020   Procedure: RIGHT/LEFT HEART CATH AND CORONARY ANGIOGRAPHY;  Surgeon: Burnell Blanks, MD;  Location: [facility];  Service: Cardiovascular;  Laterality: N/A;   TEE WITHOUT CARDIOVERSION N/A 05/04/2020   Procedure: TRANSESOPHAGEAL ECHOCARDIOGRAM (TEE);  Surgeon: Rexene Alberts, MD;  Location: [facility];  Service: Open Heart Surgery;  Laterality: N/A;   UPPER GASTROINTESTINAL ENDOSCOPY     Family History  Problem Relation Age of Onset   Kidney disease Mother    Heart disease Mother    Throat cancer Father    Alcoholism Father    Heart attack Father    Lymphoma Brother 77   Heart attack Brother    Diabetes Brother    Hypertension Brother    Cancer Son    Colon cancer  Neg Hx    Stroke Neg Hx    Esophageal cancer Neg Hx    Rectal cancer Neg Hx    Stomach cancer Neg Hx    Outpatient Medications Prior to Visit  Medication Sig Dispense Refill   Accu-Chek FastClix Lancets MISC USE TO CHECK BLOOD SUGARS 3 TIMES DAILY 306 each 3   acetaminophen (TYLENOL) 500 MG tablet Take 500 mg by mouth every 6 (six) hours as needed for mild pain or headache.     amitriptyline (ELAVIL) 10 MG tablet Take 1 tablet (10 mg total) by mouth at bedtime as needed for sleep.     aspirin EC 81 MG tablet Take 81 mg by mouth in the morning.     blood glucose meter kit and supplies Dispense based on patient and insurance preference. Use up to four times daily as directed.  (FOR ICD-10 E10.9, E11.9). 1 each 0   Blood Glucose Monitoring Suppl DEVI by Does not apply route. Accu Check Guide     Cholecalciferol (VITAMIN D3) 25 MCG (1000 UT) CAPS Take 2,000 Units by mouth daily before lunch.     Evolocumab (REPATHA SURECLICK) 741 MG/ML SOAJ Inject 1 pen into the skin every 14 (fourteen) days. 2 mL 11   glipiZIDE (GLUCOTROL XL) 10 MG 24 hr tablet Take 1 tablet (10 mg total) by mouth daily with breakfast. 90 tablet 3   glucose blood (ACCU-CHEK GUIDE) test strip TEST ONCE DAILY 100 each 5   glucose blood test strip 1 each by Other route as needed for other. Use as instructed  Please dispense Accu check Guide Test strips     Golimumab (SIMPONI ARIA IV) Inject into the vein every 2 (two) months.     icosapent Ethyl (VASCEPA) 1 g capsule Take 2 capsules (2 g total) by mouth 2 (two) times daily. (Patient taking differently: Take 2 g by mouth in the morning and at bedtime.) 120 capsule 11   JARDIANCE 10 MG TABS tablet TAKE 1 TABLET(10 MG) BY MOUTH DAILY BEFORE BREAKFAST 30 tablet 2   levothyroxine (SYNTHROID) 100 MCG tablet Take 100 mcg by mouth daily before breakfast.     losartan (COZAAR) 50 MG tablet Take 50 mg by mouth daily.     Magnesium 250 MG TABS Take 250 mg by mouth at bedtime.     metoprolol tartrate (LOPRESSOR) 25 MG tablet TAKE 1 TABLET(25 MG) BY MOUTH TWICE DAILY (Patient taking differently: Take 25 mg by mouth in the morning and at bedtime.) 180 tablet 3   pantoprazole (PROTONIX) 40 MG tablet Take 1 tablet (40 mg total) by mouth daily before breakfast.     PARoxetine (PAXIL) 20 MG tablet Take 1 tablet (20 mg total) by mouth in the morning. 90 tablet 3   Semaglutide,0.25 or 0.5MG/DOS, (OZEMPIC, 0.25 OR 0.5 MG/DOSE,) 2 MG/1.5ML SOPN Inject 0.5 mg into the skin once a week. (Patient taking differently: Inject 0.5 mg into the skin every Monday.) 1.5 mL 6   traZODone (DESYREL) 50 MG tablet Take 0.5 tablets (25 mg total) by mouth at bedtime as needed for sleep. 30 tablet  6   valACYclovir (VALTREX) 1000 MG tablet Take 1 tablet (1,000 mg total) by mouth daily as needed (Fever Blisters). 180 tablet 0   nystatin (MYCOSTATIN) 100000 UNIT/ML suspension Take 5 mLs (500,000 Units total) by mouth 4 (four) times daily. Swish and swallow 60 mL 0   amoxicillin (AMOXIL) 500 MG capsule Take 2,000 mg by mouth See admin instructions. Take 2,000 mg by  mouth one hour prior to dental treatment     No facility-administered medications prior to visit.   Allergies  Allergen Reactions   Codeine Nausea And Vomiting and Other (See Comments)    Severe stomach cramps, also   Antihistamines, Diphenhydramine-Type Other (See Comments)    Causes hyperactivity   Gabapentin     Urinary incontinence   Statins Other (See Comments)    Joint pains   Sulfa Antibiotics Other (See Comments)    Reaction not recalled   Erythromycin Hives, Rash and Other (See Comments)    Allergic due to dental work from 50 years ago. It was used in packing and resulted in rash/hives inside and outside of mouth.   Trulicity [Dulaglutide] Itching    Objective:   Today's Vitals   02/22/21 1053  BP: (!) 142/78  Pulse: 75  Temp: (!) 97 F (36.1 C)  TempSrc: Temporal  SpO2: 98%  Weight: 146 lb (66.2 kg)  Height: '5\' 3"'  (1.6 m)   Body mass index is 25.86 kg/m.   General: Well developed, well nourished. No acute distress. Mouth: The previously noted exudates are resolved, though the tongue continues to appear to have a   prominence of the papilae. Lungs: Clear to auscultation bilaterally. No wheezing, rales or rhonchi. CV: RRR without murmurs or rubs. Pulses 2+ bilaterally. Extremities: 1-2 + edema noted. Psych: Alert and oriented. Normal mood and affect.  Health Maintenance Due  Topic Date Due   Hepatitis C Screening  Never done   TETANUS/TDAP  Never done   Zoster Vaccines- Shingrix (2 of 2) 03/29/2011   Lab Results Last CBC Lab Results  Component Value Date   WBC 9.0 02/21/2021   HGB 13.5  02/21/2021   HCT 41.8 02/21/2021   MCV 91.1 02/21/2021   MCH 29.1 02/07/2021   RDW 15.6 (H) 02/21/2021   PLT 187.0 82/80/0349   Last metabolic panel Lab Results  Component Value Date   GLUCOSE 422 (H) 02/21/2021   NA 131 (L) 02/21/2021   K 5.2 (H) 02/21/2021   CL 99 02/21/2021   CO2 27 02/21/2021   BUN 37 (H) 02/21/2021   CREATININE 1.70 (H) 02/21/2021   GFRNONAA 31 (L) 02/07/2021   CALCIUM 9.1 02/21/2021   PHOS 3.4 02/05/2021   PROT 7.0 02/21/2021   ALBUMIN 3.8 02/21/2021   LABGLOB 2.5 05/16/2018   AGRATIO 1.7 05/16/2018   BILITOT 0.5 02/21/2021   ALKPHOS 99 02/21/2021   AST 18 02/21/2021   ALT 19 02/21/2021   ANIONGAP 10 02/07/2021   ProBNP (last 3 results) Recent Labs    09/01/20 1023 10/26/20 1137 02/21/21 1209  PROBNP 2,950* 2,308* 200.0*     Imaging: Echocardiogram (06/15/2020) IMPRESSIONS   1. Left ventricular ejection fraction, by estimation, is 60 to 65%. The left ventricle has normal function. The left ventricle has no regional wall motion abnormalities. Left ventricular diastolic parameters are  consistent with Grade I diastolic dysfunction (impaired relaxation). The average left ventricular global  longitudinal strain is -17.3 %. The global longitudinal strain is normal.   2. Right ventricular systolic function is normal. The right ventricular size is normal.   3. The mitral valve is normal in structure. Moderate mitral valve regurgitation. No evidence of mitral stenosis.   4. The aortic valve has been repaired/replaced. Aortic valve regurgitation is not visualized. No aortic stenosis is present. There is a 23 mm Edwards Inspiris Resilia valve present in the aortic position.  Procedure Date: 05/04/20. Echo findings are consistent with normal  structure and function of the aortic valve prosthesis. Aortic valve mean gradient measures 4.0 mmHg. Aortic valve Vmax measures 1.30 m/s.   5. The inferior vena cava is normal in size with greater than 50% respiratory  variability, suggesting right atrial pressure of 3 mmHg.   Comparison(s): Prior images reviewed side by side. TEE 05/04/20 EF 55-60%. Moderate MR.   Assessment & Plan:   1. Bilateral lower extremity edema The edema appears to be possibly cardiac related. I recommended she restart her Lasix once a day. Ms. Sligar has ana appointment with her cardiologist in 2 days, so she will discuss this further with him.  2. Type 2 diabetes mellitus with hyperglycemia, unspecified whether long term insulin use ([place]) Ms. Wallner recent blood sugars have been quite high. I would like to add a daily long-acting insulin dose to her regimen. I asked her to continue to check her fasting blood sugars and I will see her back in 2 weeks.  - insulin glargine (LANTUS) 100 UNIT/ML Solostar Pen; Inject 10 Units into the skin daily.  Dispense: 15 mL; Refill: 1  3. Thrush There may be some mild residual thrush. I will renew her prescription and reassess in 2 weeks.  - nystatin (MYCOSTATIN) 100000 UNIT/ML suspension; Take 5 mLs (500,000 Units total) by mouth 4 (four) times daily. Swish and swallow  Dispense: 60 mL; Refill: 0  [name], MD

## 2021-02-24 ENCOUNTER — Telehealth: Payer: Self-pay | Admitting: Family Medicine

## 2021-02-24 ENCOUNTER — Ambulatory Visit
Admission: RE | Admit: 2021-02-24 | Discharge: 2021-02-24 | Disposition: A | Discharge status: Home / Self Care | Payer: Medicare Other | Source: Ambulatory Visit | Attending: Cardiovascular Disease | Admitting: Cardiovascular Disease

## 2021-02-24 ENCOUNTER — Other Ambulatory Visit: Payer: Self-pay

## 2021-02-24 ENCOUNTER — Other Ambulatory Visit: Payer: Self-pay | Admitting: Cardiovascular Disease

## 2021-02-24 ENCOUNTER — Encounter: Payer: Self-pay | Admitting: Cardiovascular Disease

## 2021-02-24 ENCOUNTER — Ambulatory Visit (INDEPENDENT_AMBULATORY_CARE_PROVIDER_SITE_OTHER): Payer: Medicare Other | Admitting: Cardiovascular Disease

## 2021-02-24 ENCOUNTER — Telehealth: Payer: Self-pay | Admitting: Cardiovascular Disease

## 2021-02-24 VITALS — BP 150/68 | HR 80 | Ht 63.0 in | Wt 147.0 lb

## 2021-02-24 DIAGNOSIS — Z79899 Other long term (current) drug therapy: Secondary | ICD-10-CM | POA: Diagnosis not present

## 2021-02-24 DIAGNOSIS — E785 Hyperlipidemia, unspecified: Secondary | ICD-10-CM | POA: Diagnosis not present

## 2021-02-24 DIAGNOSIS — I2510 Atherosclerotic heart disease of native coronary artery without angina pectoris: Secondary | ICD-10-CM | POA: Diagnosis not present

## 2021-02-24 DIAGNOSIS — M1000 Idiopathic gout, unspecified site: Secondary | ICD-10-CM

## 2021-02-24 DIAGNOSIS — I739 Peripheral vascular disease, unspecified: Secondary | ICD-10-CM

## 2021-02-24 DIAGNOSIS — Z953 Presence of xenogenic heart valve: Secondary | ICD-10-CM

## 2021-02-24 DIAGNOSIS — I10 Essential (primary) hypertension: Secondary | ICD-10-CM | POA: Diagnosis not present

## 2021-02-24 DIAGNOSIS — E1142 Type 2 diabetes mellitus with diabetic polyneuropathy: Secondary | ICD-10-CM

## 2021-02-24 MED ORDER — PREGABALIN 150 MG PO CAPS: 150.0000 mg | ORAL_CAPSULE | Freq: Two times a day (BID) | ORAL | 3 refills | Status: DC

## 2021-02-24 MED ORDER — PREGABALIN 75 MG PO CAPS: 75.0000 mg | ORAL_CAPSULE | Freq: Two times a day (BID) | ORAL | 3 refills | Status: DC

## 2021-02-24 MED ORDER — COLCHICINE 0.6 MG PO TABS: 0.6000 mg | ORAL_TABLET | Freq: Two times a day (BID) | ORAL | 0 refills | Status: DC

## 2021-02-24 NOTE — Patient Instructions (Addendum)
Medication Instructions:  Your physician has recommended you make the following change in your medication:  1-STOP Amitriptlyline (Elavil)  2-START Lyrica 75 mg by mouth twice daily 2-START colchicine 0.6 mg by mouth twice daily for 2 to 3 weeks.   *If you need a refill on your cardiac medications before your next appointment, please call your pharmacy*  Lab Work: If you have labs (blood work) drawn today and your tests are completely normal, you will receive your results only by: [place] (if you have MyChart) OR A paper copy in the mail If you have any lab test that is abnormal or we need to change your treatment, we will call you to review the results.  Testing/Procedures: Your provider wants you to have a xray of your left foot today at [facility] on the first floor at [facility]. [address]    Follow-Up: At [facility], you and your health needs are our priority.  As part of our continuing mission to provide you with exceptional heart care, we have created designated Provider Care Teams.  These Care Teams include your primary Cardiologist (physician) and Advanced Practice Providers (APPs -  Physician Assistants and Nurse Practitioners) who all work together to provide you with the care you need, when you need it.  We recommend signing up for the patient portal called "MyChart".  Sign up information is provided on this After Visit Summary.  MyChart is used to connect with patients for Virtual Visits (Telemedicine).  Patients are able to view lab/test results, encounter notes, upcoming appointments, etc.  Non-urgent messages can be sent to your provider as well.   To learn more about what you can do with MyChart, go to [email or web address].    Your next appointment:   3 month(s)  The format for your next appointment:   In Person  Provider:   [name], MD {

## 2021-02-24 NOTE — Telephone Encounter (Signed)
Error

## 2021-02-24 NOTE — Telephone Encounter (Signed)
°*  STAT* If patient is at the pharmacy, call can be transferred to refill team.   1. Which medications need to be refilled? (please list name of each medication and dose if known)  pregabalin (LYRICA) 75 MG capsule   2. Which pharmacy/location (including street and city if local pharmacy) is medication to be sent to? [id number] - [place], [place] - [address]  3. Do they need a 30 day or 90 day supply?   90 day supply  The pharmacy did not receive the Rx from this morning. Please assist.

## 2021-02-24 NOTE — Telephone Encounter (Signed)
Please review and advise. Thanks. Dm/cma  

## 2021-02-24 NOTE — Telephone Encounter (Signed)
Called pt's medication pregabalin 75 mg tablet into pt's pharmacy and gave a verbal order over the phone. Dispensing 180 capsules, with 3 refills. Pharmacist verbalized understanding.

## 2021-02-24 NOTE — Telephone Encounter (Signed)
Pt is wanting a stronger strength of nystatin (MYCOSTATIN) 100000 UNIT/ML suspension [[id number]] , she says it's not helping her tongue.  She went to her cardiologist today and he x-rayed her L foot. He took her off Amitriptyline and has added pregabalin (LYRICA) 75 MG capsule [[id number]]  bid and colchicine 0.6 MG tablet [[id number]]  bid.

## 2021-02-27 NOTE — Telephone Encounter (Signed)
Spoke to patient, she states her tongue is better and she has used all of the medication she had.  She wants to wait to make af/u till she gets the xray results from cardiology.  Then she will call and make an appointment to f/u.  Dm/cma

## 2021-02-28 ENCOUNTER — Emergency Department (HOSPITAL_COMMUNITY): Payer: Medicare Other

## 2021-02-28 ENCOUNTER — Telehealth: Payer: Self-pay | Admitting: Family Medicine

## 2021-02-28 ENCOUNTER — Encounter (HOSPITAL_COMMUNITY): Payer: Self-pay | Admitting: Emergency Medicine

## 2021-02-28 ENCOUNTER — Other Ambulatory Visit: Payer: Self-pay

## 2021-02-28 ENCOUNTER — Observation Stay (HOSPITAL_COMMUNITY): Payer: Medicare Other

## 2021-02-28 ENCOUNTER — Telehealth: Payer: Self-pay

## 2021-02-28 ENCOUNTER — Inpatient Hospital Stay (HOSPITAL_COMMUNITY)
Admission: EM | Admit: 2021-02-28 | Discharge: 2021-03-13 | DRG: 240 | Disposition: A | Discharge status: Rehabilitation Facility | Payer: Medicare Other | Attending: Student | Admitting: Student

## 2021-02-28 DIAGNOSIS — R600 Localized edema: Secondary | ICD-10-CM | POA: Diagnosis present

## 2021-02-28 DIAGNOSIS — E1165 Type 2 diabetes mellitus with hyperglycemia: Secondary | ICD-10-CM

## 2021-02-28 DIAGNOSIS — H532 Diplopia: Secondary | ICD-10-CM | POA: Diagnosis present

## 2021-02-28 DIAGNOSIS — N184 Chronic kidney disease, stage 4 (severe): Secondary | ICD-10-CM | POA: Diagnosis present

## 2021-02-28 DIAGNOSIS — N1832 Chronic kidney disease, stage 3b: Secondary | ICD-10-CM | POA: Diagnosis present

## 2021-02-28 DIAGNOSIS — E039 Hypothyroidism, unspecified: Secondary | ICD-10-CM | POA: Diagnosis present

## 2021-02-28 DIAGNOSIS — D638 Anemia in other chronic diseases classified elsewhere: Secondary | ICD-10-CM

## 2021-02-28 DIAGNOSIS — Z951 Presence of aortocoronary bypass graft: Secondary | ICD-10-CM

## 2021-02-28 DIAGNOSIS — Z833 Family history of diabetes mellitus: Secondary | ICD-10-CM

## 2021-02-28 DIAGNOSIS — E663 Overweight: Secondary | ICD-10-CM | POA: Diagnosis present

## 2021-02-28 DIAGNOSIS — K219 Gastro-esophageal reflux disease without esophagitis: Secondary | ICD-10-CM | POA: Diagnosis present

## 2021-02-28 DIAGNOSIS — Z923 Personal history of irradiation: Secondary | ICD-10-CM

## 2021-02-28 DIAGNOSIS — J101 Influenza due to other identified influenza virus with other respiratory manifestations: Secondary | ICD-10-CM | POA: Diagnosis not present

## 2021-02-28 DIAGNOSIS — E11621 Type 2 diabetes mellitus with foot ulcer: Secondary | ICD-10-CM | POA: Diagnosis present

## 2021-02-28 DIAGNOSIS — C50511 Malignant neoplasm of lower-outer quadrant of right female breast: Secondary | ICD-10-CM | POA: Diagnosis not present

## 2021-02-28 DIAGNOSIS — E1140 Type 2 diabetes mellitus with diabetic neuropathy, unspecified: Secondary | ICD-10-CM | POA: Diagnosis not present

## 2021-02-28 DIAGNOSIS — Z170 Estrogen receptor positive status [ER+]: Secondary | ICD-10-CM

## 2021-02-28 DIAGNOSIS — I214 Non-ST elevation (NSTEMI) myocardial infarction: Secondary | ICD-10-CM | POA: Diagnosis not present

## 2021-02-28 DIAGNOSIS — R2990 Unspecified symptoms and signs involving the nervous system: Secondary | ICD-10-CM | POA: Diagnosis present

## 2021-02-28 DIAGNOSIS — Z79899 Other long term (current) drug therapy: Secondary | ICD-10-CM

## 2021-02-28 DIAGNOSIS — Z9049 Acquired absence of other specified parts of digestive tract: Secondary | ICD-10-CM

## 2021-02-28 DIAGNOSIS — Z885 Allergy status to narcotic agent status: Secondary | ICD-10-CM

## 2021-02-28 DIAGNOSIS — E1122 Type 2 diabetes mellitus with diabetic chronic kidney disease: Secondary | ICD-10-CM | POA: Diagnosis present

## 2021-02-28 DIAGNOSIS — Z87891 Personal history of nicotine dependence: Secondary | ICD-10-CM

## 2021-02-28 DIAGNOSIS — L97529 Non-pressure chronic ulcer of other part of left foot with unspecified severity: Secondary | ICD-10-CM | POA: Diagnosis present

## 2021-02-28 DIAGNOSIS — Z853 Personal history of malignant neoplasm of breast: Secondary | ICD-10-CM

## 2021-02-28 DIAGNOSIS — Z66 Do not resuscitate: Secondary | ICD-10-CM | POA: Diagnosis present

## 2021-02-28 DIAGNOSIS — E1151 Type 2 diabetes mellitus with diabetic peripheral angiopathy without gangrene: Secondary | ICD-10-CM | POA: Diagnosis present

## 2021-02-28 DIAGNOSIS — Z794 Long term (current) use of insulin: Secondary | ICD-10-CM | POA: Diagnosis present

## 2021-02-28 DIAGNOSIS — E1142 Type 2 diabetes mellitus with diabetic polyneuropathy: Secondary | ICD-10-CM | POA: Diagnosis present

## 2021-02-28 DIAGNOSIS — I10 Essential (primary) hypertension: Secondary | ICD-10-CM | POA: Diagnosis present

## 2021-02-28 DIAGNOSIS — Z841 Family history of disorders of kidney and ureter: Secondary | ICD-10-CM

## 2021-02-28 DIAGNOSIS — I2510 Atherosclerotic heart disease of native coronary artery without angina pectoris: Secondary | ICD-10-CM | POA: Diagnosis present

## 2021-02-28 DIAGNOSIS — Z79891 Long term (current) use of opiate analgesic: Secondary | ICD-10-CM

## 2021-02-28 DIAGNOSIS — Z955 Presence of coronary angioplasty implant and graft: Secondary | ICD-10-CM

## 2021-02-28 DIAGNOSIS — R000 Tachycardia, unspecified: Secondary | ICD-10-CM

## 2021-02-28 DIAGNOSIS — I739 Peripheral vascular disease, unspecified: Secondary | ICD-10-CM | POA: Diagnosis present

## 2021-02-28 DIAGNOSIS — R778 Other specified abnormalities of plasma proteins: Secondary | ICD-10-CM | POA: Diagnosis present

## 2021-02-28 DIAGNOSIS — Z8249 Family history of ischemic heart disease and other diseases of the circulatory system: Secondary | ICD-10-CM

## 2021-02-28 DIAGNOSIS — D631 Anemia in chronic kidney disease: Secondary | ICD-10-CM | POA: Diagnosis present

## 2021-02-28 DIAGNOSIS — R739 Hyperglycemia, unspecified: Secondary | ICD-10-CM

## 2021-02-28 DIAGNOSIS — E871 Hypo-osmolality and hyponatremia: Secondary | ICD-10-CM

## 2021-02-28 DIAGNOSIS — E785 Hyperlipidemia, unspecified: Secondary | ICD-10-CM | POA: Diagnosis present

## 2021-02-28 DIAGNOSIS — Z8616 Personal history of COVID-19: Secondary | ICD-10-CM

## 2021-02-28 DIAGNOSIS — I129 Hypertensive chronic kidney disease with stage 1 through stage 4 chronic kidney disease, or unspecified chronic kidney disease: Secondary | ICD-10-CM | POA: Diagnosis present

## 2021-02-28 DIAGNOSIS — B009 Herpesviral infection, unspecified: Secondary | ICD-10-CM | POA: Diagnosis present

## 2021-02-28 DIAGNOSIS — Z882 Allergy status to sulfonamides status: Secondary | ICD-10-CM

## 2021-02-28 DIAGNOSIS — K5900 Constipation, unspecified: Secondary | ICD-10-CM | POA: Diagnosis not present

## 2021-02-28 DIAGNOSIS — Z7984 Long term (current) use of oral hypoglycemic drugs: Secondary | ICD-10-CM

## 2021-02-28 DIAGNOSIS — N179 Acute kidney failure, unspecified: Secondary | ICD-10-CM | POA: Diagnosis not present

## 2021-02-28 DIAGNOSIS — Z954 Presence of other heart-valve replacement: Secondary | ICD-10-CM

## 2021-02-28 DIAGNOSIS — Z7982 Long term (current) use of aspirin: Secondary | ICD-10-CM

## 2021-02-28 DIAGNOSIS — R29898 Other symptoms and signs involving the musculoskeletal system: Secondary | ICD-10-CM | POA: Diagnosis present

## 2021-02-28 DIAGNOSIS — Z7985 Long-term (current) use of injectable non-insulin antidiabetic drugs: Secondary | ICD-10-CM

## 2021-02-28 DIAGNOSIS — Z20822 Contact with and (suspected) exposure to covid-19: Secondary | ICD-10-CM | POA: Diagnosis present

## 2021-02-28 DIAGNOSIS — Z952 Presence of prosthetic heart valve: Secondary | ICD-10-CM

## 2021-02-28 DIAGNOSIS — R7989 Other specified abnormal findings of blood chemistry: Secondary | ICD-10-CM | POA: Diagnosis present

## 2021-02-28 DIAGNOSIS — Z888 Allergy status to other drugs, medicaments and biological substances status: Secondary | ICD-10-CM

## 2021-02-28 DIAGNOSIS — Z9221 Personal history of antineoplastic chemotherapy: Secondary | ICD-10-CM

## 2021-02-28 DIAGNOSIS — Z79890 Hormone replacement therapy (postmenopausal): Secondary | ICD-10-CM

## 2021-02-28 DIAGNOSIS — Z6826 Body mass index (BMI) 26.0-26.9, adult: Secondary | ICD-10-CM

## 2021-02-28 DIAGNOSIS — W1830XA Fall on same level, unspecified, initial encounter: Secondary | ICD-10-CM | POA: Diagnosis present

## 2021-02-28 DIAGNOSIS — Z881 Allergy status to other antibiotic agents status: Secondary | ICD-10-CM

## 2021-02-28 LAB — RESP PANEL BY RT-PCR (FLU A&B, COVID) ARPGX2
Influenza A by PCR: POSITIVE — AB
Influenza B by PCR: NEGATIVE
SARS Coronavirus 2 by RT PCR: NEGATIVE

## 2021-02-28 LAB — URINALYSIS, ROUTINE W REFLEX MICROSCOPIC
Bilirubin Urine: NEGATIVE
Glucose, UA: >=500 mg/dL — AB
Hgb urine dipstick: NEGATIVE
Ketones, ur: NEGATIVE mg/dL
Leukocytes,Ua: NEGATIVE
Nitrite: NEGATIVE
Protein, ur: NEGATIVE mg/dL
Specific Gravity, Urine: 1.023 (ref 1.005–1.030)
pH: 5 (ref 5.0–8.0)

## 2021-02-28 LAB — OSMOLALITY, URINE: Osmolality, Ur: 367 mOsm/kg (ref 300–900)

## 2021-02-28 LAB — SODIUM, URINE, RANDOM: Sodium, Ur: 83 mmol/L

## 2021-02-28 LAB — CBC
HCT: 40 % (ref 36.0–46.0)
Hemoglobin: 12.7 g/dL (ref 12.0–15.0)
MCH: 29.1 pg (ref 26.0–34.0)
MCHC: 31.8 g/dL (ref 30.0–36.0)
MCV: 91.5 fL (ref 80.0–100.0)
Platelets: 176 10*3/uL (ref 150–400)
RBC: 4.37 MIL/uL (ref 3.87–5.11)
RDW: 15.1 % (ref 11.5–15.5)
WBC: 12.9 10*3/uL — ABNORMAL HIGH (ref 4.0–10.5)
nRBC: 0 % (ref 0.0–0.2)

## 2021-02-28 LAB — BASIC METABOLIC PANEL
Anion gap: 11 (ref 5–15)
BUN: 37 mg/dL — ABNORMAL HIGH (ref 8–23)
CO2: 24 mmol/L (ref 22–32)
Calcium: 10.2 mg/dL (ref 8.9–10.3)
Chloride: 96 mmol/L — ABNORMAL LOW (ref 98–111)
Creatinine, Ser: 1.95 mg/dL — ABNORMAL HIGH (ref 0.44–1.00)
GFR, Estimated: 26 mL/min — ABNORMAL LOW (ref >60)
Glucose, Bld: 424 mg/dL — ABNORMAL HIGH (ref 70–99)
Potassium: 5 mmol/L (ref 3.5–5.1)
Sodium: 131 mmol/L — ABNORMAL LOW (ref 135–145)

## 2021-02-28 LAB — CBG MONITORING, ED
Glucose-Capillary: 361 mg/dL — ABNORMAL HIGH (ref 70–99)
Glucose-Capillary: 281 mg/dL — ABNORMAL HIGH (ref 70–99)

## 2021-02-28 LAB — CREATININE, URINE, RANDOM: Creatinine, Urine: 21.33 mg/dL

## 2021-02-28 LAB — TROPONIN I (HIGH SENSITIVITY)
Troponin I (High Sensitivity): 188 ng/L (ref <18)
Troponin I (High Sensitivity): 309 ng/L (ref <18)

## 2021-02-28 MED ORDER — INSULIN GLARGINE-YFGN 100 UNIT/ML ~~LOC~~ SOLN
10.0000 [IU] | Freq: Every day | SUBCUTANEOUS | Status: DC
  Administered 2021-03-01 – 2021-03-07 (×8): 10 [IU] via SUBCUTANEOUS
  Filled 2021-02-28 (×12): qty 0.1

## 2021-02-28 MED ORDER — SODIUM CHLORIDE 0.9 % IV SOLN: 250.0000 mL | INTRAVENOUS | Status: DC | PRN

## 2021-02-28 MED ORDER — SODIUM CHLORIDE 0.9% FLUSH: 3.0000 mL | INTRAVENOUS | Status: DC | PRN

## 2021-02-28 MED ORDER — SODIUM CHLORIDE 0.9 % IV BOLUS
1000.0000 mL | Freq: Once | INTRAVENOUS | Status: AC
Start: 2021-02-28 — End: 2021-02-28
  Administered 2021-02-28: 1000 mL via INTRAVENOUS

## 2021-02-28 MED ORDER — ASPIRIN EC 81 MG PO TBEC
81.0000 mg | DELAYED_RELEASE_TABLET | Freq: Every morning | ORAL | Status: DC
  Administered 2021-03-01 – 2021-03-04 (×4): 81 mg via ORAL
  Filled 2021-02-28 (×3): qty 1

## 2021-02-28 MED ORDER — ASPIRIN 325 MG PO TABS
325.0000 mg | ORAL_TABLET | Freq: Once | ORAL | Status: AC
Start: 2021-02-28 — End: 2021-02-28
  Administered 2021-02-28: 325 mg via ORAL
  Filled 2021-02-28: qty 1

## 2021-02-28 MED ORDER — HEPARIN BOLUS VIA INFUSION
4000.0000 [IU] | Freq: Once | INTRAVENOUS | Status: AC
  Administered 2021-02-28: 4000 [IU] via INTRAVENOUS
  Filled 2021-02-28: qty 4000

## 2021-02-28 MED ORDER — HEPARIN (PORCINE) 25000 UT/250ML-% IV SOLN
1150.0000 [IU]/h | INTRAVENOUS | Status: DC
  Administered 2021-02-28: 21:00:00 800 [IU]/h via INTRAVENOUS
  Administered 2021-03-01 (×2): 950 [IU]/h via INTRAVENOUS
  Filled 2021-02-28 (×3): qty 250

## 2021-02-28 MED ORDER — SODIUM CHLORIDE 0.9% FLUSH
3.0000 mL | Freq: Two times a day (BID) | INTRAVENOUS | Status: DC
  Administered 2021-02-28 – 2021-03-13 (×15): 3 mL via INTRAVENOUS

## 2021-02-28 MED ORDER — INSULIN ASPART 100 UNIT/ML IJ SOLN
5.0000 [IU] | Freq: Once | INTRAMUSCULAR | Status: AC
Start: 2021-02-28 — End: 2021-02-28
  Administered 2021-02-28: 5 [IU] via SUBCUTANEOUS

## 2021-02-28 MED ORDER — ACETAMINOPHEN 650 MG RE SUPP: 650.0000 mg | Freq: Four times a day (QID) | RECTAL | Status: DC | PRN

## 2021-02-28 MED ORDER — LEVOTHYROXINE SODIUM 100 MCG PO TABS
100.0000 ug | ORAL_TABLET | Freq: Every day | ORAL | Status: DC
  Administered 2021-03-01 – 2021-03-13 (×13): 100 ug via ORAL
  Filled 2021-02-28 (×13): qty 1

## 2021-02-28 MED ORDER — INSULIN ASPART 100 UNIT/ML IJ SOLN
0.0000 [IU] | INTRAMUSCULAR | Status: DC
  Administered 2021-03-01 (×3): 2 [IU] via SUBCUTANEOUS
  Administered 2021-03-01: 5 [IU] via SUBCUTANEOUS

## 2021-02-28 MED ORDER — LEVOTHYROXINE SODIUM 100 MCG PO TABS: 100.0000 ug | ORAL_TABLET | Freq: Every day | ORAL | Status: DC

## 2021-02-28 MED ORDER — ACETAMINOPHEN 325 MG PO TABS
650.0000 mg | ORAL_TABLET | Freq: Four times a day (QID) | ORAL | Status: DC | PRN
  Administered 2021-03-01 (×2): 650 mg via ORAL
  Filled 2021-02-28 (×2): qty 2

## 2021-02-28 MED ORDER — METOPROLOL TARTRATE 12.5 MG HALF TABLET
12.5000 mg | ORAL_TABLET | Freq: Two times a day (BID) | ORAL | Status: DC
  Administered 2021-03-01 – 2021-03-03 (×7): 12.5 mg via ORAL
  Filled 2021-02-28 (×9): qty 1

## 2021-02-28 MED ORDER — HYDROCODONE-ACETAMINOPHEN 5-325 MG PO TABS
1.0000 | ORAL_TABLET | ORAL | Status: DC | PRN
  Administered 2021-03-01 – 2021-03-02 (×6): 2 via ORAL
  Administered 2021-03-03: 15:00:00 1 via ORAL
  Administered 2021-03-03 – 2021-03-06 (×10): 2 via ORAL
  Filled 2021-02-28 (×2): qty 2
  Filled 2021-02-28: qty 1
  Filled 2021-02-28 (×13): qty 2
  Filled 2021-02-28: qty 1
  Filled 2021-02-28 (×2): qty 2

## 2021-02-28 NOTE — Telephone Encounter (Signed)
Patient called office this morning, she reports that she got up out of bed this morning at 1:00 am to go to the rest room and fell to the floor. She scooted her way to the bathroom and is unable to walk.  She reports having a pain in her lower back (achy feeling above waist line).  No medications taken for pain. She is still unable to walk now.   She is negative for all stroke symptoms except the sudden weakness in her legs that started at 1:00 am today.    Call back number is [phone].   [place] pat[place]n back after consulting with provider and advised her to go to the ER to be checked out.  She is agreeable and will do this.  Dm/cma

## 2021-02-28 NOTE — ED Provider Notes (Signed)
[facility] EMERGENCY DEPARTMENT Provider Note   CSN: [id number] Arrival date & time: 02/28/21  1054     History  Chief Complaint  Patient presents with   Weakness    [name] is a 81 y.o. female history of aortic valve replacement, CAD with CABG, diabetes, CKD here presenting with weakness.  Patient recently finished a course of steroids for epiglottitis.  Patient states since yesterday, she has diffuse weakness in bilateral legs.  She states that she woke up in the middle of the night and had a hard time walking.  Patient denies any chest pain or shortness of breath.  Denies any trouble speaking.  The history is provided by the patient.      Home Medications Prior to Admission medications   Medication Sig Start Date End Date Taking? Authorizing Provider  Accu-Chek FastClix Lancets MISC USE TO CHECK BLOOD SUGARS 3 TIMES DAILY 11/03/19   Cirigliano, Garvin Fila, DO  acetaminophen (TYLENOL) 500 MG tablet Take 500 mg by mouth every 6 (six) hours as needed for mild pain or headache.    [provider]  aspirin EC 81 MG tablet Take 81 mg by mouth in the morning.    [provider]  blood glucose meter kit and supplies Dispense based on patient and insurance preference. Use up to four times daily as directed. (FOR ICD-10 E10.9, E11.9). 11/02/20   [name], MD  Blood Glucose Monitoring Suppl DEVI by Does not apply route. Accu Check Guide    [provider]  Cholecalciferol (VITAMIN D3) 25 MCG (1000 UT) CAPS Take 2,000 Units by mouth daily before lunch.    [provider]  colchicine 0.6 MG tablet Take 1 tablet (0.6 mg total) by mouth 2 (two) times daily. 02/24/21   Josue Hector, MD  Evolocumab (REPATHA SURECLICK) 782 MG/ML SOAJ Inject 1 pen into the skin every 14 (fourteen) days. 11/18/20   Josue Hector, MD  glipiZIDE (GLUCOTROL XL) 10 MG 24 hr tablet Take 1 tablet (10 mg total) by mouth daily with breakfast. 12/19/20   [name], MD  glucose blood (ACCU-CHEK GUIDE) test strip TEST ONCE DAILY 11/04/20   [name], MD  glucose blood test strip 1 each by Other route as needed for other. Use as instructed  Please dispense Accu check Guide Test strips    [provider]  Golimumab ([place] ARIA IV) Inject into the vein every 2 (two) months.    [provider]  icosapent Ethyl (VASCEPA) 1 g capsule Take 2 capsules (2 g total) by mouth 2 (two) times daily. 11/18/20   Josue Hector, MD  insulin glargine (LANTUS) 100 UNIT/ML Solostar Pen Inject 10 Units into the skin daily. 02/22/21   [name], MD  JARDIANCE 10 MG TABS tablet TAKE 1 TABLET(10 MG) BY MOUTH DAILY BEFORE BREAKFAST 02/06/21   [name], MD  levothyroxine (SYNTHROID) 100 MCG tablet Take 100 mcg by mouth daily before breakfast.    [provider]  losartan (COZAAR) 50 MG tablet Take 50 mg by mouth daily.    [provider]  Magnesium 250 MG TABS Take 250 mg by mouth at bedtime.    [provider]  metoprolol tartrate (LOPRESSOR) 25 MG tablet TAKE 1 TABLET(25 MG) BY MOUTH TWICE DAILY 07/26/20   Josue Hector, MD  nystatin (MYCOSTATIN) 100000 UNIT/ML suspension Take 5 mLs (500,000 Units total) by mouth 4 (four) times daily. Swish and  swallow 02/22/21   [name], MD  pantoprazole (PROTONIX) 40 MG tablet Take 1 tablet (40 mg total) by mouth daily before breakfast. 02/07/21   Aline August, MD  PARoxetine (PAXIL) 20 MG tablet Take 1 tablet (20 mg total) by mouth in the morning. 01/25/21   [name], MD  pregabalin (LYRICA) 75 MG capsule Take 1 capsule (75 mg total) by mouth 2 (two) times daily. 02/24/21   Josue Hector, MD  Semaglutide,0.25 or 0.5MG/DOS, (OZEMPIC, 0.25 OR 0.5 MG/DOSE,) 2 MG/1.5ML SOPN Inject 0.5 mg into the skin once a week. 02/02/21   [name], MD  traZODone (DESYREL) 50 MG tablet Take 0.5 tablets (25 mg total) by mouth at bedtime as needed for sleep. 11/02/20   [name], MD  valACYclovir (VALTREX) 1000 MG tablet Take 1 tablet (1,000 mg total) by mouth daily as needed (Fever Blisters). 05/20/20   Ronnald Nian, DO      Allergies    Codeine; Antihistamines, diphenhydramine-type; Gabapentin; Statins; Sulfa antibiotics; Erythromycin; and Trulicity [dulaglutide]    Review of Systems   Review of Systems  Neurological:  Positive for weakness.  All other systems reviewed and are negative.  Physical Exam Updated Vital Signs BP (!) 133/91    Pulse 91    Temp 98.8 F (37.1 C) (Oral)    Resp 16    Wt 66.7 kg    SpO2 100%    BMI 26.05 kg/m  Physical Exam Vitals and nursing note reviewed.  Constitutional:      Comments: Chronically ill-appearing  HENT:     Head: Normocephalic.     Nose: Nose normal.     Mouth/Throat:     Mouth: Mucous membranes are dry.  Eyes:     Extraocular Movements: Extraocular movements intact.     Pupils: Pupils are equal, round, and reactive to light.  Cardiovascular:     Rate and Rhythm: Normal rate and regular rhythm.     Pulses: Normal pulses.     Heart sounds: Normal heart sounds.  Pulmonary:     Effort: Pulmonary effort is normal.     Breath sounds: Normal breath sounds.  Abdominal:     General: Abdomen is flat.     Palpations: Abdomen is soft.  Musculoskeletal:        General: Normal range of motion.     Cervical back: Normal range of motion and neck supple.  Skin:    General: Skin is warm.     Capillary Refill: Capillary refill takes less than 2 seconds.  Neurological:     General: No focal deficit present.     Mental Status: She is oriented to person, place, and time.     Comments: Patient is too weak to even stand up by herself.  Patient can only take 2-3 steps and then had to sit back down.  Patient has normal strength in upper extremities.  Normal finger-to-nose bilaterally.   Psychiatric:        Mood and Affect: Mood normal.        Behavior: Behavior normal.    ED Results / Procedures /  Treatments   Labs (all labs ordered are listed, but only abnormal results are displayed) Labs Reviewed  BASIC METABOLIC PANEL - Abnormal; Notable for the following components:      Result Value   Sodium 131 (*)    Chloride 96 (*)    Glucose, Bld 424 (*)    BUN 37 (*)  Creatinine, Ser 1.95 (*)    GFR, Estimated 26 (*)    All other components within normal limits  CBC - Abnormal; Notable for the following components:   WBC 12.9 (*)    All other components within normal limits  URINALYSIS, ROUTINE W REFLEX MICROSCOPIC - Abnormal; Notable for the following components:   Glucose, UA >=500 (*)    Bacteria, UA RARE (*)    All other components within normal limits  CBG MONITORING, ED - Abnormal; Notable for the following components:   Glucose-Capillary 361 (*)    All other components within normal limits  TROPONIN I (HIGH SENSITIVITY) - Abnormal; Notable for the following components:   Troponin I (High Sensitivity) 188 (*)    All other components within normal limits  TROPONIN I (HIGH SENSITIVITY) - Abnormal; Notable for the following components:   Troponin I (High Sensitivity) 309 (*)    All other components within normal limits  RESP PANEL BY RT-PCR (FLU A&B, COVID) [id number]  HEPARIN LEVEL (UNFRACTIONATED)  CBC  HEPARIN LEVEL (UNFRACTIONATED)  CBG MONITORING, ED    EKG EKG Interpretation  Date/Time:  Tuesday February 28 2021 18:37:51 EST Ventricular Rate:  94 PR Interval:  167 QRS Duration: 99 QT Interval:  369 QTC Calculation: 462 R Axis:   76 Text Interpretation: Sinus rhythm Probable anterior infarct, age indeterminate Lateral leads are also involved unchanged since previous Confirmed by Wandra Arthurs ([id number]) on 02/28/2021 6:41:57 PM  Radiology CT Head Wo Contrast  Result Date: 02/28/2021 CLINICAL DATA:  Head trauma.  Weakness. EXAM: CT HEAD WITHOUT CONTRAST TECHNIQUE: Contiguous axial images were obtained from the base of the skull through the vertex without intravenous  contrast. RADIATION DOSE REDUCTION: This exam was performed according to the departmental dose-optimization program which includes automated exposure control, adjustment of the mA and/or kV according to patient size and/or use of iterative reconstruction technique. COMPARISON:  Head MRI 11/05/2017 FINDINGS: Brain: There is no evidence of an acute infarct, intracranial hemorrhage, mass, midline shift, or extra-axial fluid collection. The ventricles and sulci are within normal limits for age. Hypodensities in the cerebral white matter nonspecific but compatible with mild chronic small vessel ischemic disease. Vascular: Calcified atherosclerosis at the skull base. No hyperdense vessel. Skull: No acute fracture or suspicious osseous lesion. Sinuses/Orbits: Visualized paranasal sinuses and mastoid air cells are clear. Visualized orbits are unremarkable. Other: None. IMPRESSION: 1. No evidence of acute intracranial abnormality. 2. Mild chronic small vessel ischemic disease. Electronically Signed   By: Logan Bores M.D.   On: 02/28/2021 11:58   DG Chest Port 1 View  Result Date: 02/28/2021 CLINICAL DATA:  Weakness EXAM: PORTABLE CHEST 1 VIEW COMPARISON:  01/20/2021 FINDINGS: Prior valve replacement and CABG. Insert COPD Heart and mediastinal contours are within normal limits. No focal opacities or effusions. No acute bony abnormality. IMPRESSION: COPD.  No active disease. Electronically Signed   By: Rolm Baptise M.D.   On: 02/28/2021 19:21    Procedures Procedures  CRITICAL CARE Performed by: Wandra Arthurs   Total critical care time: 30 minutes  Critical care time was exclusive of separately billable procedures and treating other patients.  Critical care was necessary to treat or prevent imminent or life-threatening deterioration.  Critical care was time spent personally by me on the following activities: development of treatment plan with patient and/or surrogate as well as nursing, discussions with  consultants, evaluation of patient's response to treatment, examination of patient, obtaining history from patient or surrogate, ordering and performing  treatments and interventions, ordering and review of laboratory studies, ordering and review of radiographic studies, pulse oximetry and re-evaluation of patient's condition.  Medications Ordered in ED Medications  aspirin tablet 325 mg (has no administration in time range)  heparin bolus via infusion 4,000 Units (has no administration in time range)  heparin ADULT infusion 100 units/mL (25000 units/263m) (has no administration in time range)  sodium chloride 0.9 % bolus 1,000 mL (1,000 mLs Intravenous New Bag/Given 02/28/21 1902)  insulin aspart (novoLOG) injection 5 Units (5 Units Subcutaneous Given 02/28/21 1856)    ED Course/ Medical Decision Making/ A&P                           Medical Decision Making PVITALIA STOUGHis a 80y.o. female who presented with bilateral leg weakness.  Concern for possible stroke versus atypical presentation of ACS versus renal failure versus electrolyte abnormality.  Plan to get CT head and CBC and CMP and troponin.  8:13 PM Patient's initial troponin is 188 and went up to 309.  However her kidney function also worsening to 1.95.  I discussed with Dr. HDebara Pickettfrom cardiology.  He saw the patient and states that he thinks patient likely has side effect from Lyrica.  He also recommended heparin and medical management.  He states that patient's creatinine is not amenable to cath right now and recommend heparin and cardiology to follow-up.  He recommended the medicine service to admit the patient since patient has AKI and hyperglycemia and elevated troponin   Problems Addressed: AKI (acute kidney injury) ([place]: acute illness or injury Hyperglycemia: acute illness or injury NSTEMI (non-ST elevated myocardial infarction) ([facility]: acute illness or injury  Amount and/or Complexity of Data Reviewed Labs: ordered.  Decision-making details documented in ED Course. Radiology: ordered and independent interpretation performed. Decision-making details documented in ED Course. ECG/medicine tests: ordered and independent interpretation performed. Decision-making details documented in ED Course.  Risk OTC drugs. Prescription drug management. Decision regarding hospitalization.  Final Clinical Impression(s) / ED Diagnoses Final diagnoses:  AKI (acute kidney injury) ([facility]  Hyperglycemia  NSTEMI (non-ST elevated myocardial infarction) ([facility]    Rx / DC Orders ED Discharge Orders     None         YDrenda Freeze MD 02/28/21 2015

## 2021-02-28 NOTE — Subjective & Objective (Signed)
Recent covid, developed Epiglotitis, treated with with steroids Developed DM2, polyneuropathy Came in feeling weak in the legs bilaterally No CP, no SOB

## 2021-02-28 NOTE — ED Triage Notes (Signed)
Patient here with complaint of weakness where she frequently drops items she is holding in her hands that started approximately four-to-five days ago, then last night had difficulty standing and fell multiple time while walking to the bathroom, is not anticoagulated. Patient states she started taking Lyrica on Saturday and things that may be related. Patient is alert, oriented, and in no apparent distress. Sensation and grip strength equal bilaterally, no facial droop, no dysarthria, no arm drift.

## 2021-02-28 NOTE — Telephone Encounter (Signed)
Pt called and said she was unable to really hold anything or walk today and that she is having a little bit of back pain. I transferred to [facility] for further discussion

## 2021-02-28 NOTE — Progress Notes (Signed)
ANTICOAGULATION CONSULT NOTE - Initial Consult  Pharmacy Consult for heparin Indication: chest pain/ACS  Allergies  Allergen Reactions   Codeine Nausea And Vomiting and Other (See Comments)    Severe stomach cramps, also   Antihistamines, Diphenhydramine-Type Other (See Comments)    Causes hyperactivity   Gabapentin     Urinary incontinence   Statins Other (See Comments)    Joint pains   Sulfa Antibiotics Other (See Comments)    Reaction not recalled   Erythromycin Hives, Rash and Other (See Comments)    Allergic due to dental work from 50 years ago. It was used in packing and resulted in rash/hives inside and outside of mouth.   Trulicity [Dulaglutide] Itching    Patient Measurements:   Heparin Dosing Weight: 65.9 kg   Vital Signs: Temp: 98.8 F (37.1 C) (02/07 1101) Temp Source: Oral (02/07 1101) BP: 133/91 (02/07 1845) Pulse Rate: 91 (02/07 1845)  Labs: Recent Labs    02/28/21 1128 02/28/21 1806  HGB 12.7  --   HCT 40.0  --   PLT 176  --   CREATININE 1.95*  --   TROPONINIHS 188* 309*    Estimated Creatinine Clearance: 21.8 mL/min (A) (by C-G formula based on SCr of 1.95 mg/dL (H)).   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[place])    Claudication ([place])    Coronary artery disease    stent 2007   Depression    Diabetes mellitus    Difficulty sleeping    Diverticulosis 12/10/03   Elevated cholesterol    Generalized weakness    GERD (gastroesophageal reflux disease)    Heart murmur    Hepatitis    as an infant   HSV-1 (herpes simplex virus 1) infection    Hyperlipidemia    Hypertension    Hypothyroidism     Osteoarthritis    PAD (peripheral artery disease) ([place])    Personal history of chemotherapy    Personal history of radiation therapy    Pneumonia    2014 september and March 2022   Rheumatoid arthritis ([place])    S/P aortic valve replacement with bioprosthetic valve 05/04/2020   [facility] Inspiris Resilia stented bovine pericardial tissue valve, size 23 mm   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 PMH significant for aortic valve replacement, s/p CABG x2, CAD, T2DM, and CKD. Troponin elevated at 309. CBC stable. Not on anticoagulation prior to admission.   Goal of Therapy:  Heparin level 0.3-0.7 units/ml Monitor platelets by anticoagulation protocol: Yes   Plan:  Heparin bolus 4000 units x1 Start heparin drip at 800 units/hr Heparin level in 8 hours Monitor CBC, HL, and s/sx of bleeding daily.   Joseph Art, Pharm.D. PGY-1 Pharmacy Resident [phone] 02/28/2021 7:49 PM

## 2021-02-28 NOTE — ED Provider Triage Note (Signed)
Emergency Medicine Provider Triage Evaluation Note  [name] , a 81 y.o. female  was evaluated in triage.  Pt complains of weakness and fatigue.  Patient had both COVID and the flu back in January, and was hospitalized for this.  She states that she has felt very fatigued.  She recently started pregabalin for neuropathy, and is concerned her symptoms might be related to that.  She did have a fall last night while walking to the bathroom.  There is no head trauma or loss of consciousness.  She is complaining of left side pain, states she had this before but it was worsening after she had to roll onto her left side to try to get up.  Review of Systems  Positive: Weakness, fatigue Negative: Chest pain, shortness of breath, headache, vision changes  Physical Exam  BP 114/70 (BP Location: Right Arm)    Pulse (!) 107    Temp 98.8 F (37.1 C) (Oral)    Resp 16    SpO2 98%  Gen:   Awake, no distress   Resp:  Normal effort  MSK:   Moves extremities without difficulty  Other:  5/5 strength in all extremities, no facial droop, no slurred speech, no pronator drift  Medical Decision Making  Medically screening exam initiated at 11:17 AM.  Appropriate orders placed.  VERBIE BABIC was informed that the remainder of the evaluation will be completed by another provider, this initial triage assessment does not replace that evaluation, and the importance of remaining in the ED until their evaluation is complete.     Vere Diantonio T, PA-C 02/28/21 1126

## 2021-02-28 NOTE — ED Triage Notes (Signed)
Troponin  188

## 2021-02-28 NOTE — H&P (Signed)
ROSY ESTABROOK BDZ:[id number] DOB: 1940-05-21 DOA: 02/28/2021     PCP: [name], MD   Outpatient Specialists:   CARDS:   Dr.NIshan    Patient arrived to ER on 02/28/21 at 1054 Referred by Attending Toy Baker, MD   Patient coming from:    home Lives With family    Chief Complaint:   Chief Complaint  Patient presents with   Weakness    HPI: [name] is a 81 y.o. female with medical history significant of DM2, HTN HLD, CAD sp CABG recent Covid  PAD with claudication    Presented with   lower extr weakness Recent covid, developed Epiglotitis, treated with with steroids Developed DM2, polyneuropathy Came in feeling weak in the legs bilaterally No CP, no SOB   No cough no fever No bleeding No tobacco Glass of wine every afternoon Reports she would like to get up but after a taking a nap today noted that her legs just would not hold her, Arm strength wnl Normal sensation Minimal thoracic back pain that radiates around to the front   Initial COVID TEST  NEGATIVE   Lab Results  Component Value Date   [place] 02/28/2021   [place] NEGATIVE 02/05/2021   [place] NEGATIVE 05/02/2020   [place] NEGATIVE 04/01/2020     Regarding pertinent Chronic problems:     Hyperlipidemia -  not on statins Lipid Panel     Component Value Date/Time   CHOL 125 02/09/2021 0816   TRIG 238 (H) 02/09/2021 0816   HDL 43 02/09/2021 0816   CHOLHDL 2.9 02/09/2021 0816   CHOLHDL 6 11/02/2020 0940   VLDL 42.8 (H) 11/02/2020 0940   LDLCALC 45 02/09/2021 0816   LDLDIRECT 172.0 11/02/2020 0940   LABVLDL 37 02/09/2021 0816     HTN on Losartan metoprolol    Echocardiogram 02/29/20 Moderate-severe aortic stenosis, V-max 3.5 m/s, mean gradient 33 mmHg, DI 0.26, moderate MR, EF 65-70, no RWMA, moderate LVH, normal RVSF    CAD  - On Aspirin , betablocker, Plavix                 -  followed by cardiology                - last cardiac cath  April 2022      DM 2 -  Lab Results  Component Value Date   HGBA1C 10.5 (H) 02/02/2021   on insulin,     Hypothyroidism:  Lab Results  Component Value Date   TSH 18.50 (H) 02/02/2021   on synthroid        CKD stage IIIb- baseline Cr 1.7 Estimated Creatinine Clearance: 21.8 mL/min (A) (by C-G formula based on SCr of 1.95 mg/dL (H)).  Lab Results  Component Value Date   CREATININE 1.95 (H) 02/28/2021   CREATININE 1.70 (H) 02/21/2021   CREATININE 1.71 (H) 02/13/2021    While in ER:    Noted elevated trop trending up   Ordered  CT HEAD   NON acute  CXR - COPD  MR thoracic and lumbar -  nonacute    Following Medications were ordered in ER: Medications  aspirin tablet 325 mg (has no administration in time range)  heparin bolus via infusion 4,000 Units (has no administration in time range)  heparin ADULT infusion 100 units/mL (25000 units/271m) (has no administration in time range)  insulin aspart (novoLOG) injection 0-9 Units (has no administration in time range)  sodium chloride 0.9 %  bolus 1,000 mL (1,000 mLs Intravenous New Bag/Given 02/28/21 1902)  insulin aspart (novoLOG) injection 5 Units (5 Units Subcutaneous Given 02/28/21 1856)    _______________________________________________________ ER Provider Called: Cardology       They Recommend admit to medicine    SEEN in ER   ED Triage Vitals  Enc Vitals Group     BP 02/28/21 1101 114/70     Pulse Rate 02/28/21 1101 (!) 107     Resp 02/28/21 1101 16     Temp 02/28/21 1101 98.8 F (37.1 C)     Temp Source 02/28/21 1101 Oral     SpO2 02/28/21 1101 98 %     Weight 02/28/21 1900 147 lb 0.8 oz (66.7 kg)     Height --      Head Circumference --      Peak Flow --      Pain Score 02/28/21 1110 6     Pain Loc --      Pain Edu? --      Excl. in [place]? --   TMAX(24)@     _________________________________________ Significant initial  Findings: Abnormal Labs Reviewed  RESP PANEL BY RT-PCR (FLU A&B, COVID) ARPGX2 -  Abnormal; Notable for the following components:      Result Value   Influenza A by PCR POSITIVE (*)    All other components within normal limits  BASIC METABOLIC PANEL - Abnormal; Notable for the following components:   Sodium 131 (*)    Chloride 96 (*)    Glucose, Bld 424 (*)    BUN 37 (*)    Creatinine, Ser 1.95 (*)    GFR, Estimated 26 (*)    All other components within normal limits  CBC - Abnormal; Notable for the following components:   WBC 12.9 (*)    All other components within normal limits  URINALYSIS, ROUTINE W REFLEX MICROSCOPIC - Abnormal; Notable for the following components:   Glucose, UA >=500 (*)    Bacteria, UA RARE (*)    All other components within normal limits  CBG MONITORING, ED - Abnormal; Notable for the following components:   Glucose-Capillary 361 (*)    All other components within normal limits  TROPONIN I (HIGH SENSITIVITY) - Abnormal; Notable for the following components:   Troponin I (High Sensitivity) 188 (*)    All other components within normal limits  TROPONIN I (HIGH SENSITIVITY) - Abnormal; Notable for the following components:   Troponin I (High Sensitivity) 309 (*)    All other components within normal limits     _________________________ Troponin 188 309 ECG: Ordered Personally reviewed by me showing: HR : 94 Rhythm:  NSR,   nonspecific changes  QTC 462    The recent clinical data is shown below. Vitals:   02/28/21 1401 02/28/21 1725 02/28/21 1845 02/28/21 1900  BP: 123/67 118/65 (!) 133/91   Pulse: 99 98 91   Resp: '16 17 16   ' Temp:      TempSrc:      SpO2: 97% 96% 100%   Weight:    66.7 kg    WBC     Component Value Date/Time   WBC 12.9 (H) 02/28/2021 1128   LYMPHSABS 1.7 02/07/2021 0201   LYMPHSABS 2.6 08/24/2020 1005   LYMPHSABS 2.6 02/21/2016 1035   MONOABS 0.6 02/07/2021 0201   MONOABS 0.7 02/21/2016 1035   EOSABS 0.0 02/07/2021 0201   EOSABS 0.5 (H) 08/24/2020 1005   BASOSABS 0.0 02/07/2021 0201   BASOSABS  0.1 08/24/2020 1005   BASOSABS 0.1 02/21/2016 1035        UA   no evidence of UTI      Urine analysis:    Component Value Date/Time   COLORURINE YELLOW 02/28/2021 1118   APPEARANCEUR CLEAR 02/28/2021 1118   LABSPEC 1.023 02/28/2021 1118   PHURINE 5.0 02/28/2021 1118   GLUCOSEU >=500 (A) 02/28/2021 1118   GLUCOSEU NEGATIVE 11/02/2020 0940   HGBUR NEGATIVE 02/28/2021 1118   BILIRUBINUR NEGATIVE 02/28/2021 1118   BILIRUBINUR negative 10/19/2020 1549   KETONESUR NEGATIVE 02/28/2021 1118   PROTEINUR NEGATIVE 02/28/2021 1118   UROBILINOGEN 0.2 11/02/2020 0940   NITRITE NEGATIVE 02/28/2021 1118   LEUKOCYTESUR NEGATIVE 02/28/2021 1118    Results for orders placed or performed during the hospital encounter of 02/28/21  Resp Panel by RT-PCR (Flu A&B, Covid) Nasopharyngeal Swab     Status: Abnormal   Collection Time: 02/28/21  6:41 PM   Specimen: Nasopharyngeal Swab; Nasopharyngeal(NP) swabs in vial transport medium  Result Value Ref Range Status   SARS Coronavirus 2 by RT PCR NEGATIVE NEGATIVE Final         Influenza A by PCR POSITIVE (A) NEGATIVE Final   Influenza B by PCR NEGATIVE NEGATIVE Final           _______________________________________________ Hospitalist was called for admission for bilateral leg weakness  The following Work up has been ordered so far:  Orders Placed This Encounter  Procedures   Resp Panel by RT-PCR (Flu A&B, Covid) Nasopharyngeal Swab   CT Head Wo Contrast   DG Chest Port 1 View   Basic metabolic panel   CBC   Urinalysis, Routine w reflex microscopic   Heparin level (unfractionated)   CBC   Heparin level (unfractionated)   Hemoglobin A1c   Document Height and Actual Weight   Cardiac monitoring   STAT CBG when hypoglycemia is suspected. If treated, recheck every 15 minutes after each treatment until CBG >/= 70 mg/dl   Refer to Hypoglycemia Protocol Sidebar Report for treatment of CBG < 70 mg/dl   Inpatient consult to Cardiology   heparin  per pharmacy consult   Consult to hospitalist   CBG monitoring, ED   CBG monitoring, ED   EKG 12-Lead   ED EKG   EKG 12-Lead   EKG 12-Lead   ECHOCARDIOGRAM COMPLETE   Place in observation (patient's expected length of stay will be less than 2 midnights)     OTHER Significant initial  Findings:  labs showing:    Recent Labs  Lab 02/28/21 1128  NA 131*  K 5.0  CO2 24  GLUCOSE 424*  BUN 37*  CREATININE 1.95*  CALCIUM 10.2    Cr  Up from baseline see below Lab Results  Component Value Date   CREATININE 1.95 (H) 02/28/2021   CREATININE 1.70 (H) 02/21/2021   CREATININE 1.71 (H) 02/13/2021    Lab Results  Component Value Date   CALCIUM 10.2 02/28/2021   PHOS 3.4 02/05/2021          Plt: Lab Results  Component Value Date   PLT 176 02/28/2021       COVID-19 Labs  No results for input(s): DDIMER, FERRITIN, LDH, CRP in the last 72 hours.  Lab Results  Component Value Date   SARSCOV2NAA NEGATIVE 02/28/2021   SARSCOV2NAA NEGATIVE 02/05/2021   SARSCOV2NAA NEGATIVE 05/02/2020   [place] NEGATIVE 04/01/2020         Recent Labs  Lab 02/28/21 1128  WBC 12.9*  HGB 12.7  HCT 40.0  MCV 91.5  PLT 176    HG/HCT * stable,  Down *Up from baseline see below    Component Value Date/Time   HGB 12.7 02/28/2021 1128   HGB 12.7 08/24/2020 1005   HGB 12.0 02/21/2016 1035   HCT 40.0 02/28/2021 1128   HCT 38.8 08/24/2020 1005   HCT 36.4 02/21/2016 1035   MCV 91.5 02/28/2021 1128   MCV 88 08/24/2020 1005   MCV 87.4 02/21/2016 1035      DM  labs:  HbA1C: Recent Labs    07/20/20 0908 11/02/20 0940 02/02/21 0826  HGBA1C 7.7* 7.2* 10.5*       CBG (last 3)  Recent Labs    02/28/21 1843  GLUCAP 361*          Cultures:    Component Value Date/Time   SDES  02/05/2021 1030    BLOOD RIGHT HAND BLOOD Performed at [facility], [address], [place], [place] [address]    [facility]  02/05/2021 1030    Blood Culture adequate volume  BOTTLES DRAWN AEROBIC AND ANAEROBIC Performed at [facility], [address] [place], [place] [address]    CULT  02/05/2021 1030    NO GROWTH 5 DAYS Performed at [facility], [place] [address], [place], [place] [address]    REPTSTATUS 02/10/2021 FINAL 02/05/2021 1030     Radiological Exams on Admission: CT Head Wo Contrast  Result Date: 02/28/2021 CLINICAL DATA:  Head trauma.  Weakness. EXAM: CT HEAD WITHOUT CONTRAST TECHNIQUE: Contiguous axial images were obtained from the base of the skull through the vertex without intravenous contrast. RADIATION DOSE REDUCTION: This exam was performed according to the departmental dose-optimization program which includes automated exposure control, adjustment of the mA and/or kV according to patient size and/or use of iterative reconstruction technique. COMPARISON:  Head MRI 11/05/2017 FINDINGS: Brain: There is no evidence of an acute infarct, intracranial hemorrhage, mass, midline shift, or extra-axial fluid collection. The ventricles and sulci are within normal limits for age. Hypodensities in the cerebral white matter nonspecific but compatible with mild chronic small vessel ischemic disease. Vascular: Calcified atherosclerosis at the skull base. No hyperdense vessel. Skull: No acute fracture or suspicious osseous lesion. Sinuses/Orbits: Visualized paranasal sinuses and mastoid air cells are clear. Visualized orbits are unremarkable. Other: None. IMPRESSION: 1. No evidence of acute intracranial abnormality. 2. Mild chronic small vessel ischemic disease. Electronically Signed   By: Logan Bores M.D.   On: 02/28/2021 11:58   MR THORACIC SPINE WO CONTRAST  Result Date: 03/01/2021 CLINICAL DATA:  Initial evaluation for low back pain, cauda equina syndrome. EXAM: MRI THORACIC AND LUMBAR SPINE WITHOUT CONTRAST TECHNIQUE: Multiplanar and multiecho pulse sequences of the thoracic and lumbar spine were obtained without intravenous contrast. COMPARISON:   None. FINDINGS: MRI THORACIC SPINE FINDINGS Alignment: Mild dextroscoliosis. Alignment otherwise normal with preservation of the normal thoracic kyphosis. No listhesis. Vertebrae: Vertebral body height maintained without acute or chronic fracture. Bone marrow signal intensity within normal limits. No discrete or worrisome osseous lesions or abnormal marrow edema. Cord:  Normal signal and morphology. Paraspinal and other soft tissues: Unremarkable. Disc levels: Normal for age multilevel disc desiccation seen throughout the thoracic spine. No significant disc bulge or focal disc herniation. No significant facet pathology. No canal or neural foraminal stenosis or evidence for neural impingement. MRI LUMBAR SPINE FINDINGS Segmentation:  Examination degraded by motion artifact. Standard segmentation. Lowest well-formed disc space labeled  the L5-S1 level. Alignment: Dextroscoliosis with straightening of the normal lumbar lordosis. Trace degenerative retrolisthesis of L3 on L4 through L5 on S1. Vertebrae: Vertebral body height maintained without acute or chronic fracture. Bone marrow signal intensity within normal limits. No worrisome osseous lesions. Discogenic reactive endplate change present about the L3-4 interspace. No other abnormal marrow edema. Conus medullaris and cauda equina: Conus extends to the L1 level. Conus and cauda equina appear normal. Paraspinal and other soft tissues: Chronic postoperative changes present within the posterior paraspinous soft tissues. No adverse features. Few scattered benign appearing cyst noted about the kidneys. Visualized visceral structures otherwise unremarkable. Disc levels: L1-2: Mild disc bulge with disc desiccation and reactive endplate spurring. Disc bulging asymmetric to the right. Mild facet and ligament flavum hypertrophy. No significant spinal stenosis. Foramina remain patent. L2-3: Mild intervertebral disc space narrowing with diffuse disc bulge and disc desiccation.  Associated reactive endplate spurring. Changes asymmetric to the left. Mild to moderate bilateral facet hypertrophy. Resultant mild to moderate canal with left lateral recess stenosis. Mild left L2 foraminal narrowing. Right neural foramen remains patent. L3-4: Degenerative intervertebral disc space narrowing with disc desiccation and diffuse disc bulge. Associated discogenic reactive endplate change with marginal endplate osteophytic spurring. Moderate bilateral facet hypertrophy. Probable remote posterior decompression. Persistent moderate to severe canal with left worse than right lateral recess stenosis. Moderate bilateral L3 foraminal narrowing. L4-5: Mild disc bulge with disc desiccation, asymmetric to the right. Associated reactive endplate spurring, also worse on the right. Severe right worse than left facet arthrosis. Prior posterior decompression. Residual mild narrowing of the lateral recesses bilaterally. Central canal remains patent. Moderate right with mild left L4 foraminal stenosis. L5-S1: Mild disc bulge with disc desiccation. Moderate left worse than right facet arthrosis. Prior posterior decompression. No significant spinal stenosis. Moderate right with mild left L5 foraminal narrowing. IMPRESSION: MR THORACIC SPINE IMPRESSION: No acute abnormality within the thoracic spine. No significant disc pathology or stenosis. No neural impingement. MR LUMBAR SPINE IMPRESSION: 1. No acute abnormality within the lumbar spine. 2. Postoperative changes from prior posterior decompression at L3-4 through L5-S1. Persistent moderate to severe canal with left worse than right lateral recess stenosis at the L3-4 level with moderate bilateral L3 foraminal narrowing. 3. Right eccentric disc osteophyte and facet arthrosis at L4-5 and L5-S1 with resultant moderate right L4 and L5 foraminal stenosis. 4. Left eccentric disc bulge and facet hypertrophy at L2-3 with resultant mild to moderate canal and left lateral recess  stenosis. Electronically Signed   By: Jeannine Boga M.D.   On: 03/01/2021 00:11   MR LUMBAR SPINE WO CONTRAST  Result Date: 03/01/2021 CLINICAL DATA:  Initial evaluation for low back pain, cauda equina syndrome. EXAM: MRI THORACIC AND LUMBAR SPINE WITHOUT CONTRAST TECHNIQUE: Multiplanar and multiecho pulse sequences of the thoracic and lumbar spine were obtained without intravenous contrast. COMPARISON:  None. FINDINGS: MRI THORACIC SPINE FINDINGS Alignment: Mild dextroscoliosis. Alignment otherwise normal with preservation of the normal thoracic kyphosis. No listhesis. Vertebrae: Vertebral body height maintained without acute or chronic fracture. Bone marrow signal intensity within normal limits. No discrete or worrisome osseous lesions or abnormal marrow edema. Cord:  Normal signal and morphology. Paraspinal and other soft tissues: Unremarkable. Disc levels: Normal for age multilevel disc desiccation seen throughout the thoracic spine. No significant disc bulge or focal disc herniation. No significant facet pathology. No canal or neural foraminal stenosis or evidence for neural impingement. MRI LUMBAR SPINE FINDINGS Segmentation:  Examination degraded by motion artifact. Standard segmentation. Lowest  well-formed disc space labeled the L5-S1 level. Alignment: Dextroscoliosis with straightening of the normal lumbar lordosis. Trace degenerative retrolisthesis of L3 on L4 through L5 on S1. Vertebrae: Vertebral body height maintained without acute or chronic fracture. Bone marrow signal intensity within normal limits. No worrisome osseous lesions. Discogenic reactive endplate change present about the L3-4 interspace. No other abnormal marrow edema. Conus medullaris and cauda equina: Conus extends to the L1 level. Conus and cauda equina appear normal. Paraspinal and other soft tissues: Chronic postoperative changes present within the posterior paraspinous soft tissues. No adverse features. Few scattered benign  appearing cyst noted about the kidneys. Visualized visceral structures otherwise unremarkable. Disc levels: L1-2: Mild disc bulge with disc desiccation and reactive endplate spurring. Disc bulging asymmetric to the right. Mild facet and ligament flavum hypertrophy. No significant spinal stenosis. Foramina remain patent. L2-3: Mild intervertebral disc space narrowing with diffuse disc bulge and disc desiccation. Associated reactive endplate spurring. Changes asymmetric to the left. Mild to moderate bilateral facet hypertrophy. Resultant mild to moderate canal with left lateral recess stenosis. Mild left L2 foraminal narrowing. Right neural foramen remains patent. L3-4: Degenerative intervertebral disc space narrowing with disc desiccation and diffuse disc bulge. Associated discogenic reactive endplate change with marginal endplate osteophytic spurring. Moderate bilateral facet hypertrophy. Probable remote posterior decompression. Persistent moderate to severe canal with left worse than right lateral recess stenosis. Moderate bilateral L3 foraminal narrowing. L4-5: Mild disc bulge with disc desiccation, asymmetric to the right. Associated reactive endplate spurring, also worse on the right. Severe right worse than left facet arthrosis. Prior posterior decompression. Residual mild narrowing of the lateral recesses bilaterally. Central canal remains patent. Moderate right with mild left L4 foraminal stenosis. L5-S1: Mild disc bulge with disc desiccation. Moderate left worse than right facet arthrosis. Prior posterior decompression. No significant spinal stenosis. Moderate right with mild left L5 foraminal narrowing. IMPRESSION: MR THORACIC SPINE IMPRESSION: No acute abnormality within the thoracic spine. No significant disc pathology or stenosis. No neural impingement. MR LUMBAR SPINE IMPRESSION: 1. No acute abnormality within the lumbar spine. 2. Postoperative changes from prior posterior decompression at L3-4 through  L5-S1. Persistent moderate to severe canal with left worse than right lateral recess stenosis at the L3-4 level with moderate bilateral L3 foraminal narrowing. 3. Right eccentric disc osteophyte and facet arthrosis at L4-5 and L5-S1 with resultant moderate right L4 and L5 foraminal stenosis. 4. Left eccentric disc bulge and facet hypertrophy at L2-3 with resultant mild to moderate canal and left lateral recess stenosis. Electronically Signed   By: Jeannine Boga M.D.   On: 03/01/2021 00:11   DG Chest Port 1 View  Result Date: 02/28/2021 CLINICAL DATA:  Weakness EXAM: PORTABLE CHEST 1 VIEW COMPARISON:  01/20/2021 FINDINGS: Prior valve replacement and CABG. Insert COPD Heart and mediastinal contours are within normal limits. No focal opacities or effusions. No acute bony abnormality. IMPRESSION: COPD.  No active disease. Electronically Signed   By: Rolm Baptise M.D.   On: 02/28/2021 19:21   _______________________________________________________________________________________________________ Latest  Blood pressure (!) 133/91, pulse 91, temperature 98.8 F (37.1 C), temperature source Oral, resp. rate 16, weight 66.7 kg, SpO2 100 %.   Vitals  labs and radiology finding personally reviewed  Review of Systems:    Pertinent positives include: bilateral leg weakness  Constitutional:  No weight loss, night sweats, Fevers, chills, fatigue, weight loss  HEENT:  No headaches, Difficulty swallowing,Tooth/dental problems,Sore throat,  No sneezing, itching, ear ache, nasal congestion, post nasal drip,  Cardio-vascular:  No  chest pain, Orthopnea, PND, anasarca, dizziness, palpitations.no Bilateral lower extremity swelling  GI:  No heartburn, indigestion, abdominal pain, nausea, vomiting, diarrhea, change in bowel habits, loss of appetite, melena, blood in stool, hematemesis Resp:  no shortness of breath at rest. No dyspnea on exertion, No excess mucus, no productive cough, No non-productive cough, No  coughing up of blood.No change in color of mucus.No wheezing. Skin:  no rash or lesions. No jaundice GU:  no dysuria, change in color of urine, no urgency or frequency. No straining to urinate.  No flank pain.  Musculoskeletal:  No joint pain or no joint swelling. No decreased range of motion. No back pain.  Psych:  No change in mood or affect. No depression or anxiety. No memory loss.  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, inv mammary, in situ,ER/PR +,HER2 -   Cancer ([place])    Carcinoma of breast treated with adjuvant chemotherapy ([place])    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[place])    Claudication ([place])    Coronary artery disease    stent 2007   Depression    Diabetes mellitus    Difficulty sleeping    Diverticulosis 12/10/03   Elevated cholesterol    Generalized weakness    GERD (gastroesophageal reflux disease)    Heart murmur    Hepatitis    as an infant   HSV-1 (herpes simplex virus 1) infection    Hyperlipidemia    Hypertension    Hypothyroidism    Osteoarthritis    PAD (peripheral artery disease) ([place])    Personal history of chemotherapy    Personal history of radiation therapy    Pneumonia    2014 september and March 2022   Rheumatoid arthritis ([place])    S/P aortic valve replacement with bioprosthetic valve 05/04/2020   [place] Inspiris Resilia stented bovine pericardial tissue valve, size 23 mm   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   LIMA to D3, SVG to PDA, EVH via right thigh    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   Dr [name]   Spinal stenosis    Thyroid disease       Past Surgical History:  Procedure Laterality Date   ABDOMINAL AORTOGRAM W/LOWER EXTREMITY Bilateral 04/29/2019   Procedure: ABDOMINAL AORTOGRAM W/LOWER EXTREMITY;  Surgeon: Marty Heck, MD;  Location: [facility];  Service: Cardiovascular;  Laterality: Bilateral;   ABDOMINAL AORTOGRAM W/LOWER EXTREMITY Left 07/01/2019   Procedure: ABDOMINAL AORTOGRAM W/ Left LOWER EXTREMITY [place];  Surgeon: Marty Heck, MD;  Location: [facility];  Service: Cardiovascular;  Laterality: Left;   ANTERIOR AND POSTERIOR REPAIR N/A 04/05/2016   Procedure: ANTERIOR (CYSTOCELE) AND POSTERIOR REPAIR (RECTOCELE);  Surgeon: [name], MD;  Location: [facility];  Service: Gynecology;  Laterality: N/A;   AORTIC VALVE REPLACEMENT N/A 05/04/2020   Procedure: AORTIC VALVE REPLACEMENT (AVR) USING INSPIRIS 23MM RESILIA AORTIC VALVE;  Surgeon: Rexene Alberts, MD;  Location: [facility];  Service: Open Heart Surgery;  Laterality: N/A;   BREAST  BIOPSY     BREAST LUMPECTOMY  1983   benign biopsy   BREAST LUMPECTOMY Right 12/29/2010   snbx, ER/PR +, Her2 -, 0/1 node pos.   CARPAL TUNNEL RELEASE Bilateral 9/12,8,12   CHOLECYSTECTOMY     COLONOSCOPY N/A 02/09/2013   Procedure: COLONOSCOPY;  Surgeon: [name], MD;  Location: [facility];  Service: Endoscopy;  Laterality: N/A;   COLONOSCOPY     CORONARY ANGIOPLASTY     CORONARY ARTERY BYPASS GRAFT N/A 05/04/2020   Procedure: CORONARY ARTERY BYPASS GRAFTING (CABG), ON PUMP, TIMES TWO, USING LEFT INTERNAL MAMMARY ARTERY AND RIGHT ENDOSCOPICALLY HARVESTED GREATER SAPHENOUS VEIN;  Surgeon: Rexene Alberts, MD;  Location: [place];  Service: Open Heart Surgery;  Laterality: N/A;   DILATION AND CURETTAGE OF UTERUS     ESOPHAGOGASTRODUODENOSCOPY N/A 02/09/2013   Procedure: ESOPHAGOGASTRODUODENOSCOPY (EGD);  Surgeon: [name], MD;  Location: [name];   Service: Endoscopy;  Laterality: N/A;   FOOT SPURS     HYSTEROSCOPY WITH D & C N/A 09/11/2012   Procedure: DILATATION AND CURETTAGE ;  Surgeon: [name], MD;  Location: [facility];  Service: Gynecology;  Laterality: N/A;   KNEE SURGERY  2007   LAPAROSCOPIC ASSISTED VAGINAL HYSTERECTOMY N/A 04/05/2016   Procedure: LAPAROSCOPIC ASSISTED VAGINAL HYSTERECTOMY possible BSO;  Surgeon: [name], MD;  Location: [facility];  Service: Gynecology;  Laterality: N/A;   LUMBAR LAMINECTOMY/DECOMPRESSION MICRODISCECTOMY N/A 10/20/2014   Procedure: MICRO LUMBER DECOMPRESSION L3-4 L4-5;  Surgeon: [name], MD;  Location: [facility];  Service: Orthopedics;  Laterality: N/A;   LUMBAR LAMINECTOMY/DECOMPRESSION MICRODISCECTOMY Bilateral 10/13/2015   Procedure: MICRO LUMBAR DECOMPRESSION L5 - S1 AND REDO DECOMPRESSION L4 - L5 AND REMOVAL OF FACET CYST L4 - L5 2 LEVELS;  Surgeon: [name], MD;  Location: [facility];  Service: Orthopedics;  Laterality: Bilateral;   PERIPHERAL VASCULAR INTERVENTION Right 04/29/2019   Procedure: PERIPHERAL VASCULAR INTERVENTION;  Surgeon: Marty Heck, MD;  Location: [facility];  Service: Cardiovascular;  Laterality: Right;   PORT-A-CATH REMOVAL  04/17/2011   Procedure: REMOVAL PORT-A-CATH;  Surgeon: [name], MD;  Location: [place];  Service: General;  Laterality: Left;   PORTACATH PLACEMENT  02/07/2011   Procedure: INSERTION PORT-A-CATH;  Surgeon: [name], MD;  Location: [facility];  Service: General;  Laterality: N/A;   RIGHT/LEFT HEART CATH AND CORONARY ANGIOGRAPHY N/A 03/17/2020   Procedure: RIGHT/LEFT HEART CATH AND CORONARY ANGIOGRAPHY;  Surgeon: Burnell Blanks, MD;  Location: [facility];  Service: Cardiovascular;  Laterality: N/A;   TEE WITHOUT CARDIOVERSION N/A 05/04/2020   Procedure: TRANSESOPHAGEAL ECHOCARDIOGRAM (TEE);  Surgeon: Rexene Alberts, MD;  Location: [facility];  Service: Open Heart Surgery;  Laterality: N/A;    UPPER GASTROINTESTINAL ENDOSCOPY      Social History:  Ambulatory   independently      reports that she quit smoking about 23 years ago. Her smoking use included cigarettes. She has a 80.00 pack-year smoking history. She has never used smokeless tobacco. She reports current alcohol use of about 1.0 standard drink per week. She reports that she does not use drugs.     Family History:   Family History  Problem Relation Age of Onset   Kidney disease Mother    Heart disease Mother    Throat cancer Father    Alcoholism Father    Heart attack Father    Lymphoma Brother 47   Heart attack Brother    Diabetes Brother  Hypertension Brother    Cancer Son    Colon cancer Neg Hx    Stroke Neg Hx    Esophageal cancer Neg Hx    Rectal cancer Neg Hx    Stomach cancer Neg Hx    ______________________________________________________________________________________________ Allergies: Allergies  Allergen Reactions   Codeine Nausea And Vomiting and Other (See Comments)    Severe stomach cramps, also   Antihistamines, Diphenhydramine-Type Other (See Comments)    Causes hyperactivity   Gabapentin     Urinary incontinence   Statins Other (See Comments)    Joint pains   Sulfa Antibiotics Other (See Comments)    Reaction not recalled   Erythromycin Hives, Rash and Other (See Comments)    Allergic due to dental work from 50 years ago. It was used in packing and resulted in rash/hives inside and outside of mouth.   Trulicity [Dulaglutide] Itching     Prior to Admission medications   Medication Sig Start Date End Date Taking? Authorizing Provider  Accu-Chek FastClix Lancets MISC USE TO CHECK BLOOD SUGARS 3 TIMES DAILY 11/03/19   Cirigliano, Garvin Fila, DO  acetaminophen (TYLENOL) 500 MG tablet Take 500 mg by mouth every 6 (six) hours as needed for mild pain or headache.    [provider]  aspirin EC 81 MG tablet Take 81 mg by mouth in the morning.    [provider]   blood glucose meter kit and supplies Dispense based on patient and insurance preference. Use up to four times daily as directed. (FOR ICD-10 E10.9, E11.9). 11/02/20   [name], MD  Blood Glucose Monitoring Suppl DEVI by Does not apply route. Accu Check Guide    [provider]  Cholecalciferol (VITAMIN D3) 25 MCG (1000 UT) CAPS Take 2,000 Units by mouth daily before lunch.    [provider]  colchicine 0.6 MG tablet Take 1 tablet (0.6 mg total) by mouth 2 (two) times daily. 02/24/21   Josue Hector, MD  Evolocumab (REPATHA SURECLICK) 226 MG/ML SOAJ Inject 1 pen into the skin every 14 (fourteen) days. 11/18/20   Josue Hector, MD  glipiZIDE (GLUCOTROL XL) 10 MG 24 hr tablet Take 1 tablet (10 mg total) by mouth daily with breakfast. 12/19/20   [name], MD  glucose blood (ACCU-CHEK GUIDE) test strip TEST ONCE DAILY 11/04/20   [name], MD  glucose blood test strip 1 each by Other route as needed for other. Use as instructed  Please dispense Accu check Guide Test strips    [provider]  Golimumab ([place] ARIA IV) Inject into the vein every 2 (two) months.    [provider]  icosapent Ethyl (VASCEPA) 1 g capsule Take 2 capsules (2 g total) by mouth 2 (two) times daily. 11/18/20   Josue Hector, MD  insulin glargine (LANTUS) 100 UNIT/ML Solostar Pen Inject 10 Units into the skin daily. 02/22/21   [name], MD  JARDIANCE 10 MG TABS tablet TAKE 1 TABLET(10 MG) BY MOUTH DAILY BEFORE BREAKFAST 02/06/21   [name], MD  levothyroxine (SYNTHROID) 100 MCG tablet Take 100 mcg by mouth daily before breakfast.    [provider]  losartan (COZAAR) 50 MG tablet Take 50 mg by mouth daily.    [provider]  Magnesium 250 MG TABS Take 250 mg by mouth at bedtime.    [provider]  metoprolol tartrate (LOPRESSOR) 25 MG tablet TAKE 1 TABLET(25 MG) BY MOUTH TWICE DAILY 07/26/20  Josue Hector, MD  nystatin  (MYCOSTATIN) 100000 UNIT/ML suspension Take 5 mLs (500,000 Units total) by mouth 4 (four) times daily. Swish and swallow 02/22/21   [name], MD  pantoprazole (PROTONIX) 40 MG tablet Take 1 tablet (40 mg total) by mouth daily before breakfast. 02/07/21   Aline August, MD  PARoxetine (PAXIL) 20 MG tablet Take 1 tablet (20 mg total) by mouth in the morning. 01/25/21   [name], MD  pregabalin (LYRICA) 75 MG capsule Take 1 capsule (75 mg total) by mouth 2 (two) times daily. 02/24/21   Josue Hector, MD  Semaglutide,0.25 or 0.5MG/DOS, (OZEMPIC, 0.25 OR 0.5 MG/DOSE,) 2 MG/1.5ML SOPN Inject 0.5 mg into the skin once a week. 02/02/21   [name], MD  traZODone (DESYREL) 50 MG tablet Take 0.5 tablets (25 mg total) by mouth at bedtime as needed for sleep. 11/02/20   [name], MD  valACYclovir (VALTREX) 1000 MG tablet Take 1 tablet (1,000 mg total) by mouth daily as needed (Fever Blisters). 05/20/20   Ronnald Nian, DO    ___________________________________________________________________________________________________ Physical Exam: Vitals with BMI 02/28/2021 02/28/2021 02/28/2021  Height - - -  Weight 147 lbs 1 oz - -  BMI 93.81 - -  Systolic - 017 510  Diastolic - 91 65  Pulse - 91 98      1. General:  in No  Acute distress   Chronically ill   -appearing 2. Psychological: Alert and   Oriented 3. Head/ENT:    Dry Mucous Membranes                          Head Non traumatic, neck supple                           Poor Dentition 4. SKIN:  decreased Skin turgor,  Skin clean Dry and intact no rash 5. Heart: Regular rate and rhythm no  Murmur, no Rub or gallop 6. Lungs:  Clear to auscultation bilaterally, no wheezes or crackles   7. Abdomen: Soft,  non-tender, Non distended   obese  bowel sounds present 8. Lower extremities: no clubbing, cyanosis, no  edema 9. Neurologically  strength 5 out of 5 in all 4 extremities cranial nerves II through XII intact 10. MSK: Normal range  of motion    Chart has been reviewed  ______________________________________________________________________________________________  Assessment/Plan 81 y.o. female with medical history significant of DM2, HTN HLD, CAD sp CABG recent Covid    Admitted for  elevated troponin and leg weakness  Present on Admission:  Influenza A  Hypothyroidism  Type 2 diabetes mellitus with diabetic neuropathy, without long-term current use of insulin (HCC)  Essential hypertension  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 female breast ([place])  Hyperlipidemia  GERD  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HCC)  PAD (peripheral artery disease) (HCC)  Diabetic peripheral neuropathy (HCC)  Hyponatremia  Bilateral lower extremity edema  Lower extremity weakness  Elevated troponin  Coronary atherosclerosis     Influenza A Pt with recent infection appears asymptomatic continue to monoitor  Hypothyroidism - Check TSH continue home medications at current dose   Type 2 diabetes mellitus with diabetic neuropathy, without long-term current use of insulin (HCC)  - Order Sensitive SSI   continue lantus  -  check TSH and HgA1C  - Hold by mouth medications     Essential  hypertension Continue metoprolol  PAD (peripheral artery disease) (HCC) Bilateral claudication order ABI  Bilateral lower extremity edema Possible side effect of lyrica Obtain dopplers to eval for DVT   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 female breast ([place]) Remote stable  GERD Continue PPI  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[place])  -chronic avoid nephrotoxic medications such as NSAIDs, Vanco Zosyn combo,  avoid hypotension, continue to follow renal function   Hyperlipidemia Stable cont home meds  Lower extremity weakness Unclear etiology, given recent viral illness GBS is on differential if no improvement with electrolytes and BG correction Reflexes seemed intact, obtain mRI lumbar  and thoracic spine pt w hx of br ca and reports thoracic pain  Other differential includes reaction to lyrica will hold PT OT assessment If persists would rec neurology consult in AM Noted significant hyperglycemia may be contributing  Diabetic peripheral neuropathy ([place]) Hold lyrica  Hyponatremia Partially corrects in the setting of hyperglycemia Will repeat BMET, obtain urine lytes  Elevated troponin  -no chest pain no EKG changes in the setting of  chronic kidney disease likely due to demand ischemia and poor clearance, monitor on telemetry and cycle cardiac enzymes to trend.  if continues to rise will need further work-up Echo in AM cardiology consulted and rec starting heparin  continue to cycle troponin   Coronary atherosclerosis  - chronic, continue aspirin and beta blocker*    Other plan as per orders.  DVT prophylaxis:   heparin bolus via infusion 4,000 Units Start: 02/28/21 2000    Code Status:    Code Status: Prior FULL CODE     as per patient    I had personally discussed CODE STATUS with patient     Family Communication:   Family not at  Bedside    Disposition Plan:   To home once workup is complete and patient is stable   Following barriers for discharge:                                                       Will need consultants to evaluate patient prior to discharge                        Would benefit from PT/OT eval prior to DC  Ordered                                       Diabetes care coordinator                   Transition of care consulted                   Nutrition    consulted                                      Consults called:  cardiology is aware  Admission status:  ED Disposition     ED Disposition  [place]: [place] [[id number]]  Level of Care: Progressive [102]  Admit to Progressive based on  following criteria: CARDIOVASCULAR & THORACIC of moderate stability with  acute coronary syndrome symptoms/low risk myocardial infarction/hypertensive urgency/arrhythmias/heart failure potentially compromising stability and stable post cardiovascular intervention patients.  May place patient in observation at [facility] or [facility] if equivalent level of care is available:: No  Covid Evaluation: Confirmed COVID Negative  Diagnosis: Influenza A [[id number]]  Admitting Physician: Toy Baker [[id number]]  Attending Physician: Toy Baker [[id number]]           Obs      Level of care      progressive tele indefinitely please discontinue once patient no longer qualifies COVID-19 Labs    Lab Results  Component Value Date   [place] 02/28/2021     Precautions: admitted as   Covid Negative     [place] 03/01/2021, 12:29 AM    [facility]     after 2 AM please page floor coverage PA If 7AM-7PM, please contact the day team taking care of the patient using [email or web address]   Patient was evaluated in the context of the global COVID-19 pandemic, which necessitated consideration that the patient might be at risk for infection with the SARS-CoV-2 virus that causes COVID-19. Institutional protocols and algorithms that pertain to the evaluation of patients at risk for COVID-19 are in a state of rapid change based on information released by regulatory bodies including the CDC and federal and state organizations. These policies and algorithms were followed during the patient's care.

## 2021-02-28 NOTE — Telephone Encounter (Signed)
See other message.  Dm/cma  

## 2021-02-28 NOTE — Consult Note (Addendum)
Cardiology Consultation:   Patient ID: [name] MRN: [id number]; DOB: [birth year]  Admit date: 02/28/2021 Date of Consult: 02/28/2021  PCP:  [name], MD   [facility] Providers Cardiologist:  [name], MD        Patient Profile:   [name] is a 81 y.o. female with a hx of CABG 04/22 w/ AVR, DM, HTN, HLD, PAD, hypothyroid, neuropathy, who is being seen 02/28/2021 for the evaluation of elevated troponin at the request of Dr Darl Householder.  History of Present Illness:   [name] was seen by Dr Johnsie Cancel 02/03. She was cardiac stable and no med changes.  She came to the ER today with weakness causing her to drop things. This had been going on for 4-5 days. Sx worsened, with legs getting weak and falling. She is concerned that sx started after she started taking Lyrica. No stroke sx. Trop checked in the ER and was elevated, Cards asked to see.   She has not been light-headed or dizzy, but gets weak with standing or walking. The UE weakness has been going on longer, but the LE weakness is sudden in onset. She has to get help from her husband to do anything. She has had double vision in her R eye.   She has never had anything like this before.   She has been having LE edema, this has been going on a while, intermittently. She has been getting calf pain with exertion. The neuropathy has caused her pain, sleepless nights.   She has struggled in 2023, with influenza A and epiglottitis in January, with subsequent Thrush. Because of the steroids, her blood sugars have been high.     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, inv mammary, in situ,ER/PR +,HER2 -   Cancer ([place])    Carcinoma of breast treated with adjuvant chemotherapy ([place])    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[place])    Claudication ([place])     Coronary artery disease    stent 2007   Depression    Diabetes mellitus    Difficulty sleeping    Diverticulosis 12/10/03   Elevated cholesterol    Generalized weakness    GERD (gastroesophageal reflux disease)    Heart murmur    Hepatitis    as an infant   HSV-1 (herpes simplex virus 1) infection    Hyperlipidemia    Hypertension    Hypothyroidism    Osteoarthritis    PAD (peripheral artery disease) ([place])    Personal history of chemotherapy    Personal history of radiation therapy    Pneumonia    2014 september and March 2022   Rheumatoid arthritis ([place])    S/P aortic valve replacement with bioprosthetic valve 05/04/2020   Edwards Inspiris Resilia stented bovine pericardial tissue valve, size 23 mm   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   LIMA to D3, SVG to PDA, EVH via right thigh   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   Dr [name]   Spinal stenosis    Thyroid disease     Past Surgical History:  Procedure Laterality Date   ABDOMINAL AORTOGRAM W/LOWER EXTREMITY Bilateral 04/29/2019   Procedure: ABDOMINAL AORTOGRAM W/LOWER EXTREMITY;  Surgeon: Marty Heck,  MD;  Location: [place];  Service: Cardiovascular;  Laterality: Bilateral;   ABDOMINAL AORTOGRAM W/LOWER EXTREMITY Left 07/01/2019   Procedure: ABDOMINAL AORTOGRAM W/ Left LOWER EXTREMITY [place];  Surgeon: Marty Heck, MD;  Location: [place] [facility];  Service: Cardiovascular;  Laterality: Left;   ANTERIOR AND POSTERIOR REPAIR N/A 04/05/2016   Procedure: ANTERIOR (CYSTOCELE) AND POSTERIOR REPAIR (RECTOCELE);  Surgeon: [name], MD;  Location: [facility];  Service: Gynecology;  Laterality: N/A;   AORTIC VALVE REPLACEMENT N/A 05/04/2020   Procedure: AORTIC VALVE REPLACEMENT (AVR) USING INSPIRIS 23MM RESILIA AORTIC VALVE;  Surgeon: Rexene Alberts, MD;  Location: [facility];  Service: Open Heart Surgery;  Laterality: N/A;   BREAST BIOPSY     BREAST LUMPECTOMY  1983   benign biopsy   BREAST LUMPECTOMY Right  12/29/2010   snbx, ER/PR +, Her2 -, 0/1 node pos.   CARPAL TUNNEL RELEASE Bilateral 9/12,8,12   CHOLECYSTECTOMY     COLONOSCOPY N/A 02/09/2013   Procedure: COLONOSCOPY;  Surgeon: [name], MD;  Location: [facility];  Service: Endoscopy;  Laterality: N/A;   COLONOSCOPY     CORONARY ANGIOPLASTY     CORONARY ARTERY BYPASS GRAFT N/A 05/04/2020   Procedure: CORONARY ARTERY BYPASS GRAFTING (CABG), ON PUMP, TIMES TWO, USING LEFT INTERNAL MAMMARY ARTERY AND RIGHT ENDOSCOPICALLY HARVESTED GREATER SAPHENOUS VEIN;  Surgeon: Rexene Alberts, MD;  Location: [facility];  Service: Open Heart Surgery;  Laterality: N/A;   DILATION AND CURETTAGE OF UTERUS     ESOPHAGOGASTRODUODENOSCOPY N/A 02/09/2013   Procedure: ESOPHAGOGASTRODUODENOSCOPY (EGD);  Surgeon: [name], MD;  Location: [name];  Service: Endoscopy;  Laterality: N/A;   FOOT SPURS     HYSTEROSCOPY WITH D & C N/A 09/11/2012   Procedure: DILATATION AND CURETTAGE ;  Surgeon: [name], MD;  Location: [facility];  Service: Gynecology;  Laterality: N/A;   KNEE SURGERY  2007   LAPAROSCOPIC ASSISTED VAGINAL HYSTERECTOMY N/A 04/05/2016   Procedure: LAPAROSCOPIC ASSISTED VAGINAL HYSTERECTOMY possible BSO;  Surgeon: [name], MD;  Location: [facility];  Service: Gynecology;  Laterality: N/A;   LUMBAR LAMINECTOMY/DECOMPRESSION MICRODISCECTOMY N/A 10/20/2014   Procedure: MICRO LUMBER DECOMPRESSION L3-4 L4-5;  Surgeon: [name], MD;  [facility]: [facility];  Service: Orthopedics;  Laterality: N/A;   LUMBAR LAMINECTOMY/DECOMPRESSION MICRODISCECTOMY Bilateral 10/13/2015   Procedure: MICRO LUMBAR DECOMPRESSION L5 - S1 AND REDO DECOMPRESSION L4 - L5 AND REMOVAL OF FACET CYST L4 - L5 2 LEVELS;  Surgeon: [name], MD;  Location: [facility];  Service: Orthopedics;  Laterality: Bilateral;   PERIPHERAL VASCULAR INTERVENTION Right 04/29/2019   Procedure: PERIPHERAL VASCULAR INTERVENTION;  Surgeon: Marty Heck, MD;  Location: [facility];  Service:  Cardiovascular;  Laterality: Right;   PORT-A-CATH REMOVAL  04/17/2011   Procedure: REMOVAL PORT-A-CATH;  Surgeon: [name], MD;  Location: [facility];  Service: General;  Laterality: Left;   PORTACATH PLACEMENT  02/07/2011   Procedure: INSERTION PORT-A-CATH;  Surgeon: [name], MD;  [facility]: [facility];  Service: General;  Laterality: N/A;   RIGHT/LEFT HEART CATH AND CORONARY ANGIOGRAPHY N/A 03/17/2020   Procedure: RIGHT/LEFT HEART CATH AND CORONARY ANGIOGRAPHY;  Surgeon: Burnell Blanks, MD;  Location: [facility];  Service: Cardiovascular;  Laterality: N/A;   TEE WITHOUT CARDIOVERSION N/A 05/04/2020   Procedure: TRANSESOPHAGEAL ECHOCARDIOGRAM (TEE);  Surgeon: Rexene Alberts, MD;  Location: [facility];  Service: Open Heart Surgery;  Laterality: N/A;   UPPER GASTROINTESTINAL ENDOSCOPY  Home Medications:  Prior to Admission medications   Medication Sig Start Date End Date Taking? Authorizing Provider  Accu-Chek FastClix Lancets MISC USE TO CHECK BLOOD SUGARS 3 TIMES DAILY 11/03/19   Cirigliano, Garvin Fila, DO  acetaminophen (TYLENOL) 500 MG tablet Take 500 mg by mouth every 6 (six) hours as needed for mild pain or headache.    [provider]  aspirin EC 81 MG tablet Take 81 mg by mouth in the morning.    [provider]  blood glucose meter kit and supplies Dispense based on patient and insurance preference. Use up to four times daily as directed. (FOR ICD-10 E10.9, E11.9). 11/02/20   [name], MD  Blood Glucose Monitoring Suppl DEVI by Does not apply route. Accu Check Guide    [provider]  Cholecalciferol (VITAMIN D3) 25 MCG (1000 UT) CAPS Take 2,000 Units by mouth daily before lunch.    [provider]  colchicine 0.6 MG tablet Take 1 tablet (0.6 mg total) by mouth 2 (two) times daily. 02/24/21   Josue Hector, MD  Evolocumab (REPATHA SURECLICK) 675 MG/ML SOAJ Inject 1 pen into the skin every 14 (fourteen)  days. 11/18/20   Josue Hector, MD  glipiZIDE (GLUCOTROL XL) 10 MG 24 hr tablet Take 1 tablet (10 mg total) by mouth daily with breakfast. 12/19/20   [name], MD  glucose blood (ACCU-CHEK GUIDE) test strip TEST ONCE DAILY 11/04/20   [name], MD  glucose blood test strip 1 each by Other route as needed for other. Use as instructed  Please dispense Accu check Guide Test strips    [provider]  Golimumab ([place] ARIA IV) Inject into the vein every 2 (two) months.    [provider]  icosapent Ethyl (VASCEPA) 1 g capsule Take 2 capsules (2 g total) by mouth 2 (two) times daily. 11/18/20   Josue Hector, MD  insulin glargine (LANTUS) 100 UNIT/ML Solostar Pen Inject 10 Units into the skin daily. 02/22/21   [name], MD  JARDIANCE 10 MG TABS tablet TAKE 1 TABLET(10 MG) BY MOUTH DAILY BEFORE BREAKFAST 02/06/21   [name], MD  levothyroxine (SYNTHROID) 100 MCG tablet Take 100 mcg by mouth daily before breakfast.    [provider]  losartan (COZAAR) 50 MG tablet Take 50 mg by mouth daily.    [provider]  Magnesium 250 MG TABS Take 250 mg by mouth at bedtime.    [provider]  metoprolol tartrate (LOPRESSOR) 25 MG tablet TAKE 1 TABLET(25 MG) BY MOUTH TWICE DAILY 07/26/20   Josue Hector, MD  nystatin (MYCOSTATIN) 100000 UNIT/ML suspension Take 5 mLs (500,000 Units total) by mouth 4 (four) times daily. Swish and swallow 02/22/21   [name], MD  pantoprazole (PROTONIX) 40 MG tablet Take 1 tablet (40 mg total) by mouth daily before breakfast. 02/07/21   Aline August, MD  PARoxetine (PAXIL) 20 MG tablet Take 1 tablet (20 mg total) by mouth in the morning. 01/25/21   [name], MD  pregabalin (LYRICA) 75 MG capsule Take 1 capsule (75 mg total) by mouth 2 (two) times daily. 02/24/21   Josue Hector, MD  Semaglutide,0.25 or 0.5MG/DOS, (OZEMPIC, 0.25 OR 0.5 MG/DOSE,) 2 MG/1.5ML SOPN Inject 0.5 mg into the skin once a  week. 02/02/21   [name], MD  traZODone (DESYREL) 50 MG tablet Take 0.5 tablets (25 mg total) by mouth at bedtime as needed  for sleep. 11/02/20   [name], MD  valACYclovir (VALTREX) 1000 MG tablet Take 1 tablet (1,000 mg total) by mouth daily as needed (Fever Blisters). 05/20/20   Ronnald Nian, DO    Inpatient Medications: Scheduled Meds:  aspirin  325 mg Oral Once   Continuous Infusions:  PRN Meds:   Allergies:    Allergies  Allergen Reactions   Codeine Nausea And Vomiting and Other (See Comments)    Severe stomach cramps, also   Antihistamines, Diphenhydramine-Type Other (See Comments)    Causes hyperactivity   Gabapentin     Urinary incontinence   Statins Other (See Comments)    Joint pains   Sulfa Antibiotics Other (See Comments)    Reaction not recalled   Erythromycin Hives, Rash and Other (See Comments)    Allergic due to dental work from 50 years ago. It was used in packing and resulted in rash/hives inside and outside of mouth.   Trulicity [Dulaglutide] Itching    Social History:   Social History   Socioeconomic History   Marital status: Married    Spouse name: Not on file   Number of children: 3   Years of education: Not on file   Highest education level: Not on file  Occupational History   Occupation: retired-bookkeeping  Tobacco Use   Smoking status: Former    Packs/day: 2.00    Years: 40.00    Pack years: 80.00    Types: Cigarettes    Quit date: 12/19/1997    Years since quitting: 23.2   Smokeless tobacco: Never  Vaping Use   Vaping Use: Never used  Substance and Sexual Activity   Alcohol use: Yes    Alcohol/week: 1.0 standard drink    Types: 1 Glasses of wine per week    Comment: occasiona - maybe once a month   Drug use: No   Sexual activity: Not Currently    Comment: menarch age 81, P63, HRT X 10 YRS, MENOPAUSE MID 40'S  Other Topics Concern   Not on file  Social History Narrative   One daughter died from suicide at  age 82.   Social Determinants of Health   Financial Resource Strain: Low Risk    Difficulty of Paying Living Expenses: Not hard at all  Food Insecurity: No Food Insecurity   Worried About Charity fundraiser in the Last Year: Never true   [place] in the Last Year: Never true  Transportation Needs: No Transportation Needs   Lack of Transportation (Medical): No   Lack of Transportation (Non-Medical): No  Physical Activity: Insufficiently Active   Days of Exercise per Week: 2 days   Minutes of Exercise per Session: 20 min  Stress: No Stress Concern Present   Feeling of Stress : Only a little  Social Connections: Moderately Integrated   Frequency of Communication with Friends and Family: Three times a week   Frequency of Social Gatherings with Friends and Family: Twice a week   Attends Religious Services: More than 4 times per year   Active Member of [employer] or Organizations: No   Attends Music therapist: Never   Marital Status: Married  Human resources officer Violence: Not At Risk   Fear of Current or Ex-Partner: No   Emotionally Abused: No   Physically Abused: No   Sexually Abused: No    Family History:   Family History  Problem Relation Age of Onset   Kidney disease Mother    Heart disease  Mother    Throat cancer Father    Alcoholism Father    Heart attack Father    Lymphoma Brother 28   Heart attack Brother    Diabetes Brother    Hypertension Brother    Cancer Son    Colon cancer Neg Hx    Stroke Neg Hx    Esophageal cancer Neg Hx    Rectal cancer Neg Hx    Stomach cancer Neg Hx      ROS:  Please see the history of present illness.  All other ROS reviewed and negative.     Physical Exam/Data:   Vitals:   02/28/21 1101 02/28/21 1401 02/28/21 1725 02/28/21 1845  BP: 114/70 123/67 118/65 (!) 133/91  Pulse: (!) 107 99 98 91  Resp: '16 16 17 16  ' Temp: 98.8 F (37.1 C)     TempSrc: Oral     SpO2: 98% 97% 96% 100%   No intake or output data in  the 24 hours ending 02/28/21 1940 Last 3 Weights 02/24/2021 02/22/2021 02/21/2021  Weight (lbs) 147 lb 146 lb 146 lb 6 oz  Weight (kg) 66.679 kg 66.225 kg 66.395 kg     There is no height or weight on file to calculate BMI.  General:  Well nourished, well developed, in no acute distress HEENT: normal for age, no double vision now Neck: no JVD Vascular: No carotid bruits; Distal pulses 1-2+ bilaterally Cardiac:  normal S1, S2; RRR; +valve click and systolic murmur  Lungs:  clear to auscultation bilaterally, no wheezing, rhonchi or rales  Abd: soft, nontender, no hepatomegaly  Ext: no edema Musculoskeletal:  No deformities, BUE and BLE strength normal and equal Skin: warm and dry  Neuro:  CNs 2-12 intact, no focal abnormalities noted Psych:  Normal affect   EKG:  The EKG was personally reviewed and demonstrates:  SR, HR 94, LVH is old, but T wave inversions are deeper Telemetry:  Telemetry was personally reviewed and demonstrates:  SR  Relevant CV Studies:  ECHO: 06/15/2020 post-CABG  1. Left ventricular ejection fraction, by estimation, is 60 to 65%. The  left ventricle has normal function. The left ventricle has no regional  wall motion abnormalities. Left ventricular diastolic parameters are  consistent with Grade I diastolic  dysfunction (impaired relaxation). The average left ventricular global  longitudinal strain is -17.3 %. The global longitudinal strain is normal.   2. Right ventricular systolic function is normal. The right ventricular  size is normal.   3. The mitral valve is normal in structure. Moderate mitral valve  regurgitation. No evidence of mitral stenosis.   4. The aortic valve has been repaired/replaced. Aortic valve  regurgitation is not visualized. No aortic stenosis is present. There is a  23 mm Edwards Inspiris Resilia valve present in the aortic position.  Procedure Date: 05/04/20. Echo findings are  consistent with normal structure and function of the aortic  valve  prosthesis. Aortic valve mean gradient measures 4.0 mmHg. Aortic valve  Vmax measures 1.30 m/s.   5. The inferior vena cava is normal in size with greater than 50%  respiratory variability, suggesting right atrial pressure of 3 mmHg.   Comparison(s): Prior images reviewed side by side. TEE 05/04/20 EF 55-60%. Moderate MR.   CARDIAC CATH: pre-CABG 03/17/2020 Ost RCA to Prox RCA lesion is 80% stenosed. Mid RCA lesion is 30% stenosed. RPDA lesion is 60% stenosed. 1st Mrg lesion is 50% stenosed. Prox Cx to Mid Cx lesion is 80% stenosed. Mid LAD  lesion is 80% stenosed.   1. No significant disease in the left main artery 2. Severe mid LAD stenosis. The LAD bifurcated distally beyond this lesion and the diagonal branch reaches the apex.  3. Moderate caliber non-dominant Circumflex with severe mid stenosis. The vessel is relatively small beyond the stenosis. The small to moderate caliber obtuse marginal branch arises prior to the stenosis and has moderate ostial stenosis.  4. The RCA is a large dominant artery. There is severe, heavily calcified stenosis at the ostium of the RCA. The distal stented segment is patent. There is moderately severe stenosis in the moderate caliber PDA.  5. Severe aortic stenosis (mean gradient 26.9 mmHg, peak gradient 34 mmHg)   Recommendations: She likely has paradoxical low flow/low gradient aortic. I had hoped we could proceed with TAVR, however, given the severe disease in her coronary arteries, we will have to explore surgical AVR/CABG as an option. The ostial RCA stenosis is heavily calcified and would require orbital atherectomy and would high risk for complications. The LAD stenosis could be easily treated with PCI/stenting. She is a very functional 81 yo female and I think she may be a candidate for CABG with AVR. We will let her go home today as there are no critical coronary lesions. We will plan her pre TAVR CT scans and will have her seen by one of our CT  surgeons on the valve team.         Laboratory Data:  High Sensitivity Troponin:   Recent Labs  Lab 02/28/21 1128 02/28/21 1806  TROPONINIHS 188* 309*     Chemistry Recent Labs  Lab 02/28/21 1128  NA 131*  K 5.0  CL 96*  CO2 24  GLUCOSE 424*  BUN 37*  CREATININE 1.95*  CALCIUM 10.2  GFRNONAA 26*  ANIONGAP 11    No results for input(s): PROT, ALBUMIN, AST, ALT, ALKPHOS, BILITOT in the last 168 hours. Lipids  Lab Results  Component Value Date   CHOL 125 02/09/2021   HDL 43 02/09/2021   LDLCALC 45 02/09/2021   LDLDIRECT 172.0 11/02/2020   TRIG 238 (H) 02/09/2021   CHOLHDL 2.9 02/09/2021     Hematology Recent Labs  Lab 02/28/21 1128  WBC 12.9*  RBC 4.37  HGB 12.7  HCT 40.0  MCV 91.5  MCH 29.1  MCHC 31.8  RDW 15.1  PLT 176   Thyroid  Lab Results  Component Value Date   TSH 18.50 (H) 02/02/2021   Lab Results  Component Value Date   HGBA1C 10.5 (H) 02/02/2021    DDimer No results for input(s): DDIMER in the last 168 hours.   Radiology/Studies:  CT Head Wo Contrast  Result Date: 02/28/2021 CLINICAL DATA:  Head trauma.  Weakness. EXAM: CT HEAD WITHOUT CONTRAST TECHNIQUE: Contiguous axial images were obtained from the base of the skull through the vertex without intravenous contrast. RADIATION DOSE REDUCTION: This exam was performed according to the departmental dose-optimization program which includes automated exposure control, adjustment of the mA and/or kV according to patient size and/or use of iterative reconstruction technique. COMPARISON:  Head MRI 11/05/2017 FINDINGS: Brain: There is no evidence of an acute infarct, intracranial hemorrhage, mass, midline shift, or extra-axial fluid collection. The ventricles and sulci are within normal limits for age. Hypodensities in the cerebral white matter nonspecific but compatible with mild chronic small vessel ischemic disease. Vascular: Calcified atherosclerosis at the skull base. No hyperdense vessel. Skull:  No acute fracture or suspicious osseous lesion. Sinuses/Orbits: Visualized paranasal sinuses  and mastoid air cells are clear. Visualized orbits are unremarkable. Other: None. IMPRESSION: 1. No evidence of acute intracranial abnormality. 2. Mild chronic small vessel ischemic disease. Electronically Signed   By: Logan Bores M.D.   On: 02/28/2021 11:58   DG Chest Port 1 View  Result Date: 02/28/2021 CLINICAL DATA:  Weakness EXAM: PORTABLE CHEST 1 VIEW COMPARISON:  01/20/2021 FINDINGS: Prior valve replacement and CABG. Insert COPD Heart and mediastinal contours are within normal limits. No focal opacities or effusions. No acute bony abnormality. IMPRESSION: COPD.  No active disease. Electronically Signed   By: Rolm Baptise M.D.   On: 02/28/2021 19:21     Assessment and Plan:   Elevated troponin - unclear cause as she had CABG/AVR 2022 and has not had exertional CP - however, her exertion level is limited because of her PAD - Check echo - because of decreased renal function, med rx is best option for the elevated troponin at this time - discuss w/ MD increasing her metoprolol 25 mg bid and/or adding Imdur  2. HTN - hx HTN w/ BP poorly controlled at times - on metop as above and losartan 50 mg qd - w/ BUN/Cr above baseline, suggest holding the losartan  3. Acute on chronic CKD - BUN/Cr are above her baseline, have been since  02/13/2021 - per IM  4. Hx CABG/AVR - ck echo, but trop not high enough to raise concern for graft closure  5. Weakness and fall - reason for coming to the hospital - no LOC - no unilateral sx except dbl vision R eye only - Lyrica started recently, may be the culprit - hold Lyrica and rx symptomatically for now  6. Hypothyroid - TSH was elevated during hospitalization in January - mgt per IM, would recheck  7. DM - A1c was elevated during hospitalization in January - mgt per IM   Risk Assessment/Risk Scores:      For questions or updates, please contact  [facility] Please consult [email or web address] for contact info under    Signed, Rosaria Ferries, PA-C  02/28/2021 7:40 PM

## 2021-03-01 ENCOUNTER — Observation Stay (HOSPITAL_COMMUNITY): Payer: Medicare Other

## 2021-03-01 ENCOUNTER — Inpatient Hospital Stay (HOSPITAL_COMMUNITY): Payer: Medicare Other

## 2021-03-01 DIAGNOSIS — Z66 Do not resuscitate: Secondary | ICD-10-CM | POA: Diagnosis present

## 2021-03-01 DIAGNOSIS — R609 Edema, unspecified: Secondary | ICD-10-CM | POA: Diagnosis not present

## 2021-03-01 DIAGNOSIS — E039 Hypothyroidism, unspecified: Secondary | ICD-10-CM | POA: Diagnosis present

## 2021-03-01 DIAGNOSIS — R778 Other specified abnormalities of plasma proteins: Secondary | ICD-10-CM

## 2021-03-01 DIAGNOSIS — R29898 Other symptoms and signs involving the musculoskeletal system: Secondary | ICD-10-CM | POA: Diagnosis present

## 2021-03-01 DIAGNOSIS — E785 Hyperlipidemia, unspecified: Secondary | ICD-10-CM | POA: Diagnosis present

## 2021-03-01 DIAGNOSIS — Z20822 Contact with and (suspected) exposure to covid-19: Secondary | ICD-10-CM | POA: Diagnosis present

## 2021-03-01 DIAGNOSIS — W1830XA Fall on same level, unspecified, initial encounter: Secondary | ICD-10-CM | POA: Diagnosis present

## 2021-03-01 DIAGNOSIS — E1122 Type 2 diabetes mellitus with diabetic chronic kidney disease: Secondary | ICD-10-CM | POA: Diagnosis present

## 2021-03-01 DIAGNOSIS — I129 Hypertensive chronic kidney disease with stage 1 through stage 4 chronic kidney disease, or unspecified chronic kidney disease: Secondary | ICD-10-CM | POA: Diagnosis present

## 2021-03-01 DIAGNOSIS — Z8616 Personal history of COVID-19: Secondary | ICD-10-CM | POA: Diagnosis not present

## 2021-03-01 DIAGNOSIS — E1165 Type 2 diabetes mellitus with hyperglycemia: Secondary | ICD-10-CM | POA: Diagnosis present

## 2021-03-01 DIAGNOSIS — R202 Paresthesia of skin: Secondary | ICD-10-CM | POA: Diagnosis not present

## 2021-03-01 DIAGNOSIS — I248 Other forms of acute ischemic heart disease: Secondary | ICD-10-CM | POA: Diagnosis not present

## 2021-03-01 DIAGNOSIS — Z952 Presence of prosthetic heart valve: Secondary | ICD-10-CM | POA: Diagnosis not present

## 2021-03-01 DIAGNOSIS — I739 Peripheral vascular disease, unspecified: Secondary | ICD-10-CM

## 2021-03-01 DIAGNOSIS — D638 Anemia in other chronic diseases classified elsewhere: Secondary | ICD-10-CM | POA: Diagnosis not present

## 2021-03-01 DIAGNOSIS — I10 Essential (primary) hypertension: Secondary | ICD-10-CM | POA: Diagnosis not present

## 2021-03-01 DIAGNOSIS — I2510 Atherosclerotic heart disease of native coronary artery without angina pectoris: Secondary | ICD-10-CM | POA: Diagnosis present

## 2021-03-01 DIAGNOSIS — R600 Localized edema: Secondary | ICD-10-CM | POA: Diagnosis present

## 2021-03-01 DIAGNOSIS — E1140 Type 2 diabetes mellitus with diabetic neuropathy, unspecified: Secondary | ICD-10-CM | POA: Diagnosis not present

## 2021-03-01 DIAGNOSIS — S91102A Unspecified open wound of left great toe without damage to nail, initial encounter: Secondary | ICD-10-CM | POA: Diagnosis not present

## 2021-03-01 DIAGNOSIS — K5900 Constipation, unspecified: Secondary | ICD-10-CM | POA: Diagnosis not present

## 2021-03-01 DIAGNOSIS — Z7984 Long term (current) use of oral hypoglycemic drugs: Secondary | ICD-10-CM | POA: Diagnosis not present

## 2021-03-01 DIAGNOSIS — R9439 Abnormal result of other cardiovascular function study: Secondary | ICD-10-CM | POA: Diagnosis not present

## 2021-03-01 DIAGNOSIS — K219 Gastro-esophageal reflux disease without esophagitis: Secondary | ICD-10-CM | POA: Diagnosis present

## 2021-03-01 DIAGNOSIS — L97529 Non-pressure chronic ulcer of other part of left foot with unspecified severity: Secondary | ICD-10-CM | POA: Diagnosis present

## 2021-03-01 DIAGNOSIS — Z794 Long term (current) use of insulin: Secondary | ICD-10-CM | POA: Diagnosis not present

## 2021-03-01 DIAGNOSIS — N179 Acute kidney failure, unspecified: Secondary | ICD-10-CM | POA: Diagnosis present

## 2021-03-01 DIAGNOSIS — S78112A Complete traumatic amputation at level between left hip and knee, initial encounter: Secondary | ICD-10-CM | POA: Diagnosis not present

## 2021-03-01 DIAGNOSIS — Z4781 Encounter for orthopedic aftercare following surgical amputation: Secondary | ICD-10-CM | POA: Diagnosis not present

## 2021-03-01 DIAGNOSIS — I214 Non-ST elevation (NSTEMI) myocardial infarction: Secondary | ICD-10-CM | POA: Diagnosis present

## 2021-03-01 DIAGNOSIS — H532 Diplopia: Secondary | ICD-10-CM | POA: Diagnosis present

## 2021-03-01 DIAGNOSIS — I700 Atherosclerosis of aorta: Secondary | ICD-10-CM | POA: Diagnosis not present

## 2021-03-01 DIAGNOSIS — B009 Herpesviral infection, unspecified: Secondary | ICD-10-CM | POA: Diagnosis present

## 2021-03-01 DIAGNOSIS — E663 Overweight: Secondary | ICD-10-CM | POA: Diagnosis present

## 2021-03-01 DIAGNOSIS — N184 Chronic kidney disease, stage 4 (severe): Secondary | ICD-10-CM | POA: Diagnosis present

## 2021-03-01 DIAGNOSIS — E1151 Type 2 diabetes mellitus with diabetic peripheral angiopathy without gangrene: Secondary | ICD-10-CM | POA: Diagnosis present

## 2021-03-01 DIAGNOSIS — J101 Influenza due to other identified influenza virus with other respiratory manifestations: Secondary | ICD-10-CM | POA: Diagnosis not present

## 2021-03-01 DIAGNOSIS — E11621 Type 2 diabetes mellitus with foot ulcer: Secondary | ICD-10-CM | POA: Diagnosis present

## 2021-03-01 DIAGNOSIS — I70245 Atherosclerosis of native arteries of left leg with ulceration of other part of foot: Secondary | ICD-10-CM | POA: Diagnosis not present

## 2021-03-01 DIAGNOSIS — D631 Anemia in chronic kidney disease: Secondary | ICD-10-CM | POA: Diagnosis present

## 2021-03-01 DIAGNOSIS — Z87891 Personal history of nicotine dependence: Secondary | ICD-10-CM | POA: Diagnosis not present

## 2021-03-01 DIAGNOSIS — E1142 Type 2 diabetes mellitus with diabetic polyneuropathy: Secondary | ICD-10-CM | POA: Diagnosis present

## 2021-03-01 LAB — PHOSPHORUS
Phosphorus: 3.7 mg/dL (ref 2.5–4.6)
Phosphorus: 3.9 mg/dL (ref 2.5–4.6)

## 2021-03-01 LAB — C-REACTIVE PROTEIN: CRP: 17 mg/dL — ABNORMAL HIGH (ref <1.0)

## 2021-03-01 LAB — CBC WITH DIFFERENTIAL/PLATELET
Abs Immature Granulocytes: 0.03 10*3/uL (ref 0.00–0.07)
Basophils Absolute: 0 10*3/uL (ref 0.0–0.1)
Basophils Relative: 0 %
Eosinophils Absolute: 0.2 10*3/uL (ref 0.0–0.5)
Eosinophils Relative: 2 %
HCT: 35.8 % — ABNORMAL LOW (ref 36.0–46.0)
Hemoglobin: 11.6 g/dL — ABNORMAL LOW (ref 12.0–15.0)
Immature Granulocytes: 0 %
Lymphocytes Relative: 33 %
Lymphs Abs: 3.1 10*3/uL (ref 0.7–4.0)
MCH: 30 pg (ref 26.0–34.0)
MCHC: 32.4 g/dL (ref 30.0–36.0)
MCV: 92.5 fL (ref 80.0–100.0)
Monocytes Absolute: 1.2 10*3/uL — ABNORMAL HIGH (ref 0.1–1.0)
Monocytes Relative: 13 %
Neutro Abs: 4.7 10*3/uL (ref 1.7–7.7)
Neutrophils Relative %: 52 %
Platelets: 139 10*3/uL — ABNORMAL LOW (ref 150–400)
RBC: 3.87 MIL/uL (ref 3.87–5.11)
RDW: 15.2 % (ref 11.5–15.5)
WBC: 9.2 10*3/uL (ref 4.0–10.5)
nRBC: 0 % (ref 0.0–0.2)

## 2021-03-01 LAB — URINALYSIS, COMPLETE (UACMP) WITH MICROSCOPIC
Bilirubin Urine: NEGATIVE
Glucose, UA: >=500 mg/dL — AB
Hgb urine dipstick: NEGATIVE
Ketones, ur: NEGATIVE mg/dL
Leukocytes,Ua: NEGATIVE
Nitrite: NEGATIVE
Protein, ur: NEGATIVE mg/dL
Specific Gravity, Urine: 1.01 (ref 1.005–1.030)
pH: 5 (ref 5.0–8.0)

## 2021-03-01 LAB — TSH
TSH: 5.476 u[IU]/mL — ABNORMAL HIGH (ref 0.350–4.500)
TSH: 5.599 u[IU]/mL — ABNORMAL HIGH (ref 0.350–4.500)

## 2021-03-01 LAB — COMPREHENSIVE METABOLIC PANEL
ALT: 18 U/L (ref 0–44)
ALT: 18 U/L (ref 0–44)
AST: 18 U/L (ref 15–41)
AST: 19 U/L (ref 15–41)
Albumin: 2.7 g/dL — ABNORMAL LOW (ref 3.5–5.0)
Albumin: 2.8 g/dL — ABNORMAL LOW (ref 3.5–5.0)
Alkaline Phosphatase: 73 U/L (ref 38–126)
Alkaline Phosphatase: 66 U/L (ref 38–126)
Anion gap: 12 (ref 5–15)
Anion gap: 11 (ref 5–15)
BUN: 32 mg/dL — ABNORMAL HIGH (ref 8–23)
BUN: 30 mg/dL — ABNORMAL HIGH (ref 8–23)
CO2: 21 mmol/L — ABNORMAL LOW (ref 22–32)
CO2: 24 mmol/L (ref 22–32)
Calcium: 9.1 mg/dL (ref 8.9–10.3)
Calcium: 9.1 mg/dL (ref 8.9–10.3)
Chloride: 102 mmol/L (ref 98–111)
Chloride: 101 mmol/L (ref 98–111)
Creatinine, Ser: 1.76 mg/dL — ABNORMAL HIGH (ref 0.44–1.00)
Creatinine, Ser: 1.7 mg/dL — ABNORMAL HIGH (ref 0.44–1.00)
GFR, Estimated: 29 mL/min — ABNORMAL LOW (ref >60)
GFR, Estimated: 31 mL/min — ABNORMAL LOW (ref >60)
Glucose, Bld: 275 mg/dL — ABNORMAL HIGH (ref 70–99)
Glucose, Bld: 183 mg/dL — ABNORMAL HIGH (ref 70–99)
Potassium: 3.5 mmol/L (ref 3.5–5.1)
Potassium: 3.4 mmol/L — ABNORMAL LOW (ref 3.5–5.1)
Sodium: 135 mmol/L (ref 135–145)
Sodium: 136 mmol/L (ref 135–145)
Total Bilirubin: 0.7 mg/dL (ref 0.3–1.2)
Total Bilirubin: 0.5 mg/dL (ref 0.3–1.2)
Total Protein: 5.6 g/dL — ABNORMAL LOW (ref 6.5–8.1)
Total Protein: 5.8 g/dL — ABNORMAL LOW (ref 6.5–8.1)

## 2021-03-01 LAB — DIFFERENTIAL
Abs Immature Granulocytes: 0.05 10*3/uL (ref 0.00–0.07)
Basophils Absolute: 0 10*3/uL (ref 0.0–0.1)
Basophils Relative: 0 %
Eosinophils Absolute: 0.2 10*3/uL (ref 0.0–0.5)
Eosinophils Relative: 2 %
Immature Granulocytes: 1 %
Lymphocytes Relative: 29 %
Lymphs Abs: 3.1 10*3/uL (ref 0.7–4.0)
Monocytes Absolute: 1.3 10*3/uL — ABNORMAL HIGH (ref 0.1–1.0)
Monocytes Relative: 12 %
Neutro Abs: 6.1 10*3/uL (ref 1.7–7.7)
Neutrophils Relative %: 56 %

## 2021-03-01 LAB — ECHOCARDIOGRAM COMPLETE
AR max vel: 1.69 cm2
AV Peak grad: 12.2 mmHg
Ao pk vel: 1.75 m/s
Area-P 1/2: 5.38 cm2
Calc EF: 56.1 %
MV M vel: 6.12 m/s
MV Peak grad: 149.8 mmHg
MV VTI: 1.79 cm2
S' Lateral: 2.8 cm
Single Plane A2C EF: 58.9 %
Single Plane A4C EF: 58.3 %
Weight: 2352.75 oz

## 2021-03-01 LAB — D-DIMER, QUANTITATIVE: D-Dimer, Quant: 1.48 ug/mL-FEU — ABNORMAL HIGH (ref 0.00–0.50)

## 2021-03-01 LAB — MAGNESIUM
Magnesium: 1.7 mg/dL (ref 1.7–2.4)
Magnesium: 1.8 mg/dL (ref 1.7–2.4)

## 2021-03-01 LAB — CBC
HCT: 32.6 % — ABNORMAL LOW (ref 36.0–46.0)
Hemoglobin: 10.7 g/dL — ABNORMAL LOW (ref 12.0–15.0)
MCH: 30.5 pg (ref 26.0–34.0)
MCHC: 32.8 g/dL (ref 30.0–36.0)
MCV: 92.9 fL (ref 80.0–100.0)
Platelets: 154 10*3/uL (ref 150–400)
RBC: 3.51 MIL/uL — ABNORMAL LOW (ref 3.87–5.11)
RDW: 15.2 % (ref 11.5–15.5)
WBC: 10.8 10*3/uL — ABNORMAL HIGH (ref 4.0–10.5)
nRBC: 0 % (ref 0.0–0.2)

## 2021-03-01 LAB — CBG MONITORING, ED
Glucose-Capillary: 266 mg/dL — ABNORMAL HIGH (ref 70–99)
Glucose-Capillary: 213 mg/dL — ABNORMAL HIGH (ref 70–99)
Glucose-Capillary: 153 mg/dL — ABNORMAL HIGH (ref 70–99)
Glucose-Capillary: 178 mg/dL — ABNORMAL HIGH (ref 70–99)

## 2021-03-01 LAB — TROPONIN I (HIGH SENSITIVITY): Troponin I (High Sensitivity): 127 ng/L (ref <18)

## 2021-03-01 LAB — HEMOGLOBIN A1C
Hgb A1c MFr Bld: 11.6 % — ABNORMAL HIGH (ref 4.8–5.6)
Mean Plasma Glucose: 286.22 mg/dL

## 2021-03-01 LAB — HEPARIN LEVEL (UNFRACTIONATED)
Heparin Unfractionated: 0.4 IU/mL (ref 0.30–0.70)
Heparin Unfractionated: 0.13 IU/mL — ABNORMAL LOW (ref 0.30–0.70)
Heparin Unfractionated: <0.1 IU/mL — ABNORMAL LOW (ref 0.30–0.70)

## 2021-03-01 LAB — SEDIMENTATION RATE: Sed Rate: 38 mm/hr — ABNORMAL HIGH (ref 0–22)

## 2021-03-01 LAB — BRAIN NATRIURETIC PEPTIDE: B Natriuretic Peptide: 230.8 pg/mL — ABNORMAL HIGH (ref 0.0–100.0)

## 2021-03-01 LAB — OSMOLALITY: Osmolality: 297 mOsm/kg — ABNORMAL HIGH (ref 275–295)

## 2021-03-01 LAB — CK: Total CK: 64 U/L (ref 38–234)

## 2021-03-01 LAB — GLUCOSE, CAPILLARY
Glucose-Capillary: 128 mg/dL — ABNORMAL HIGH (ref 70–99)
Glucose-Capillary: 199 mg/dL — ABNORMAL HIGH (ref 70–99)

## 2021-03-01 MED ORDER — HEPARIN BOLUS VIA INFUSION
2000.0000 [IU] | Freq: Once | INTRAVENOUS | Status: AC
  Administered 2021-03-01: 2000 [IU] via INTRAVENOUS
  Filled 2021-03-01: qty 2000

## 2021-03-01 MED ORDER — INSULIN ASPART 100 UNIT/ML IJ SOLN
0.0000 [IU] | Freq: Every day | INTRAMUSCULAR | Status: DC
  Administered 2021-03-02 – 2021-03-03 (×2): 3 [IU] via SUBCUTANEOUS
  Administered 2021-03-04: 4 [IU] via SUBCUTANEOUS
  Administered 2021-03-05: 21:00:00 3 [IU] via SUBCUTANEOUS
  Administered 2021-03-06: 4 [IU] via SUBCUTANEOUS
  Administered 2021-03-07 – 2021-03-08 (×2): 2 [IU] via SUBCUTANEOUS
  Administered 2021-03-09 – 2021-03-10 (×2): 5 [IU] via SUBCUTANEOUS
  Administered 2021-03-11: 2 [IU] via SUBCUTANEOUS

## 2021-03-01 MED ORDER — INSULIN ASPART 100 UNIT/ML IJ SOLN
0.0000 [IU] | Freq: Three times a day (TID) | INTRAMUSCULAR | Status: DC
  Administered 2021-03-02 (×2): 3 [IU] via SUBCUTANEOUS
  Administered 2021-03-02: 5 [IU] via SUBCUTANEOUS
  Administered 2021-03-03: 8 [IU] via SUBCUTANEOUS
  Administered 2021-03-03: 3 [IU] via SUBCUTANEOUS
  Administered 2021-03-03: 8 [IU] via SUBCUTANEOUS
  Administered 2021-03-04: 17:00:00 15 [IU] via SUBCUTANEOUS
  Administered 2021-03-04 (×2): 5 [IU] via SUBCUTANEOUS
  Administered 2021-03-05: 13:00:00 3 [IU] via SUBCUTANEOUS
  Administered 2021-03-05 (×2): 8 [IU] via SUBCUTANEOUS
  Administered 2021-03-06: 11 [IU] via SUBCUTANEOUS
  Administered 2021-03-06: 5 [IU] via SUBCUTANEOUS
  Administered 2021-03-06: 3 [IU] via SUBCUTANEOUS
  Administered 2021-03-07: 8 [IU] via SUBCUTANEOUS
  Administered 2021-03-07 (×2): 5 [IU] via SUBCUTANEOUS
  Administered 2021-03-08: 3 [IU] via SUBCUTANEOUS
  Administered 2021-03-08: 8 [IU] via SUBCUTANEOUS
  Administered 2021-03-08: 11 [IU] via SUBCUTANEOUS
  Administered 2021-03-09: 15 [IU] via SUBCUTANEOUS
  Administered 2021-03-09: 5 [IU] via SUBCUTANEOUS
  Administered 2021-03-09 – 2021-03-10 (×2): 8 [IU] via SUBCUTANEOUS
  Administered 2021-03-10: 11 [IU] via SUBCUTANEOUS
  Administered 2021-03-10: 5 [IU] via SUBCUTANEOUS
  Administered 2021-03-11: 8 [IU] via SUBCUTANEOUS
  Administered 2021-03-11 – 2021-03-12 (×3): 5 [IU] via SUBCUTANEOUS
  Administered 2021-03-12: 8 [IU] via SUBCUTANEOUS
  Administered 2021-03-12: 3 [IU] via SUBCUTANEOUS
  Administered 2021-03-13: 11 [IU] via SUBCUTANEOUS
  Administered 2021-03-13: 3 [IU] via SUBCUTANEOUS

## 2021-03-01 MED ORDER — POTASSIUM CHLORIDE CRYS ER 20 MEQ PO TBCR: 40.0000 meq | EXTENDED_RELEASE_TABLET | Freq: Once | ORAL | Status: DC

## 2021-03-01 NOTE — Assessment & Plan Note (Signed)
-   Check TSH continue home medications at current dose ° °

## 2021-03-01 NOTE — Assessment & Plan Note (Addendum)
Unclear etiology, given recent viral illness GBS is on differential if no improvement with electrolytes and BG correction Reflexes seemed intact, obtain mRI lumbar and thoracic spine pt w hx of br ca and reports thoracic pain  Other differential includes reaction to lyrica will hold PT OT assessment If persists would rec neurology consult in AM Noted significant hyperglycemia may be contributing

## 2021-03-01 NOTE — Assessment & Plan Note (Signed)
Remote stable

## 2021-03-01 NOTE — Assessment & Plan Note (Signed)
Possible side effect of lyrica Obtain dopplers to eval for DVT

## 2021-03-01 NOTE — Evaluation (Signed)
Occupational Therapy Evaluation Patient Details Name: [name] MRN: [id number] DOB: 14-Feb-1940 Today's Date: 03/01/2021   History of Present Illness Pt is a 81 y.o. female admitted for  elevated troponin and leg weakness. PMH includes CAD (s/p CABG, AVR), DM2, breast CA, CKD IV, HTN, OA, PAD, RA, stenosis., neuropathy, Covid.   Clinical Impression   PTA pt lives at home independently with her husband. She has had a recent hospitalization due to an illness associated with "the flu" and states "it's been one thing after another".  Pt complains of BLE pain with ambulation consistent with intermittent claudication, which improves with rest. Able to ambulate @ 60 ft @ RW level with minguard A, and requires minguard A for LB ADL tasks. Acute OT to follow with recommendation for HHOT follow up. VSS on RA throughout session. Pt with "cracks" on B heels, which are very painful. Given hx of DM, PAD and neuropathy, recommend WOC consult for recommendations on management to reduce risk of wound development - MD notified     Recommendations for follow up therapy are one component of a multi-disciplinary discharge planning process, led by the attending physician.  Recommendations may be updated based on patient status, additional functional criteria and insurance authorization.   Follow Up Recommendations  Home health OT    Assistance Recommended at Discharge Frequent or constant Supervision/Assistance  Patient can return home with the following A little help with walking and/or transfers;A little help with bathing/dressing/bathroom;Assist for transportation;Help with stairs or ramp for entrance    Functional Status Assessment  Patient has had a recent decline in their functional status and demonstrates the ability to make significant improvements in function in a reasonable and predictable amount of time.  Equipment Recommendations  BSC/3in1    Recommendations for Other Services PT consult      Precautions / Restrictions Precautions Precautions: Fall      Mobility Bed Mobility Overal bed mobility: Modified Independent                  Transfers Overall transfer level: Needs assistance   Transfers: Sit to/from Stand Sit to Stand: Supervision           General transfer comment: vc on hand placement      Balance Overall balance assessment: Needs assistance   Sitting balance-Leahy Scale: Good       Standing balance-Leahy Scale: Fair                             ADL either performed or assessed with clinical judgement   ADL Overall ADL's : Needs assistance/impaired     Grooming: Set up   Upper Body Bathing: Set up   Lower Body Bathing: Min guard   Upper Body Dressing : Set up   Lower Body Dressing: Min guard   Toilet Transfer: Min guard;Rolling walker (2 wheels);Ambulation   Toileting- Clothing Manipulation and Hygiene: Supervision/safety       Functional mobility during ADLs: Min guard;Rolling walker (2 wheels) General ADL Comments: Ambulated @ 60 ft     Vision Baseline Vision/History: 1 Wears glasses;4 Cataracts (readers) Additional Comments: Had diplopia however stopped once new medication stopped     Perception     Praxis      Pertinent Vitals/Pain Pain Assessment Pain Assessment: Faces Faces Pain Scale: Hurts little more Pain Location: B feet Pain Descriptors / Indicators: Burning Pain Intervention(s): Monitored during session     Hand Dominance Right  Extremity/Trunk Assessment Upper Extremity Assessment Upper Extremity Assessment: Overall WFL for tasks assessed   Lower Extremity Assessment Lower Extremity Assessment: Defer to PT evaluation   Cervical / Trunk Assessment Cervical / Trunk Assessment: Normal (past CABG 04/2020)   Communication Communication Communication: HOH   Cognition Arousal/Alertness: Awake/alert Behavior During Therapy: WFL for tasks assessed/performed Overall Cognitive  Status: Within Functional Limits for tasks assessed                                       General Comments  VSS on RA; B heels with "cracks" and very painful when ambulating; may benefit fomr mepalex foam pads    Exercises     Shoulder Instructions      Home Living Family/patient expects to be discharged to:: Private residence Living Arrangements: Spouse/significant other Available Help at Discharge: Family;Available 24 hours/day Type of Home: House Home Access: Stairs to enter [employer] of Steps: 5 Entrance Stairs-Rails: Right;Left Home Layout: One level     Bathroom Shower/Tub: Teacher, early years/pre: Handicapped height Bathroom Accessibility: Yes   Home Equipment: Conservation officer, nature (2 wheels)   Additional Comments: Has access to tub seat if needed      Prior Functioning/Environment Prior Level of Function : Independent/Modified Independent;Driving             Mobility Comments: Typically mod indep without DME, admits to furniture surfing around home, relies on shopping cart at stores ADLs Comments: Independent without DME        OT Problem List: Decreased strength;Decreased activity tolerance;Impaired balance (sitting and/or standing);Decreased knowledge of use of DME or AE;Impaired sensation;Pain      OT Treatment/Interventions: Self-care/ADL training;Therapeutic exercise;DME and/or AE instruction;Therapeutic activities;Patient/family education;Balance training    OT Goals(Current goals can be found in the care plan section) Acute Rehab OT Goals Patient Stated Goal: to get better and back to independence OT Goal Formulation: With patient Time For Goal Achievement: 03/15/21 Potential to Achieve Goals: Good  OT Frequency: Min 2X/week    Co-evaluation              AM-PAC OT "6 Clicks" Daily Activity     Outcome Measure Help from another person eating meals?: None Help from another person taking care of personal  grooming?: A Little Help from another person toileting, which includes using toliet, bedpan, or urinal?: A Little Help from another person bathing (including washing, rinsing, drying)?: A Little Help from another person to put on and taking off regular upper body clothing?: A Little Help from another person to put on and taking off regular lower body clothing?: A Little 6 Click Score: 19   End of Session Equipment Utilized During Treatment: Gait belt;Rolling walker (2 wheels) Nurse Communication: Mobility status  Activity Tolerance: Patient tolerated treatment well Patient left: in bed;with call bell/phone within reach;Other (comment) (being taken for procedure)  OT Visit Diagnosis: Unsteadiness on feet (R26.81);Other abnormalities of gait and mobility (R26.89);Muscle weakness (generalized) (M62.81);Pain Pain - Right/Left:  (B) Pain - part of body:  (heels/feet)                Time: 6440-3474 OT Time Calculation (min): 22 min Charges:  OT General Charges $OT Visit: 1 Visit OT Evaluation $OT Eval Moderate Complexity: [place], OT/L   Acute OT Clinical Specialist [place] Pager [phone] Office [phone]   [facility] 03/01/2021, 3:00 PM

## 2021-03-01 NOTE — Assessment & Plan Note (Signed)
Pt with recent infection appears asymptomatic continue to [employer]

## 2021-03-01 NOTE — ED Notes (Signed)
Pt ambulated in room on room air. 98% entire time Pt placed on hospital bed for comfort

## 2021-03-01 NOTE — Assessment & Plan Note (Signed)
Continue metoprolol. 

## 2021-03-01 NOTE — Assessment & Plan Note (Signed)
Stable cont home meds ?

## 2021-03-01 NOTE — Assessment & Plan Note (Signed)
Partially corrects in the setting of hyperglycemia Will repeat BMET, obtain urine lytes

## 2021-03-01 NOTE — Assessment & Plan Note (Signed)
Bilateral claudication order ABI

## 2021-03-01 NOTE — ED Notes (Signed)
Breakfast Orders Placed °

## 2021-03-01 NOTE — Assessment & Plan Note (Signed)
Continue PPI ?

## 2021-03-01 NOTE — Assessment & Plan Note (Signed)
Hold lyrica

## 2021-03-01 NOTE — Progress Notes (Signed)
ANTICOAGULATION CONSULT NOTE - Follow Up Consult  Pharmacy Consult for heparin Indication:  elevated troponin  Labs: Recent Labs    02/28/21 1128 02/28/21 1806 03/01/21 0128  HGB 12.7  --  10.7*  HCT 40.0  --  32.6*  PLT 176  --  154  HEPARINUNFRC  --   --  0.40  CREATININE 1.95*  --   --   TROPONINIHS 188* 309*  --     Assessment/Plan:  81yo female therapeutic on heparin with initial dosing for elevated troponin. Will continue infusion at current rate of 800 units/hr and confirm stable with additional level.   Wynona Neat, PharmD, BCPS  03/01/2021,2:36 AM

## 2021-03-01 NOTE — Progress Notes (Signed)
[facility] for heparin Indication: chest pain/ACS  Allergies  Allergen Reactions   Codeine Nausea And Vomiting and Other (See Comments)    Severe stomach cramps, also   Antihistamines, Diphenhydramine-Type Other (See Comments)    Causes hyperactivity   Gabapentin     Urinary incontinence   Statins Other (See Comments)    Joint pains   Sulfa Antibiotics Other (See Comments)    Reaction not recalled   Erythromycin Hives, Rash and Other (See Comments)    Allergic due to dental work from 50 years ago. It was used in packing and resulted in rash/hives inside and outside of mouth.   Trulicity [Dulaglutide] Itching    Patient Measurements: Weight: 66.7 kg (147 lb 0.8 oz) Heparin Dosing Weight: 65.9 kg   Vital Signs: Temp: 98 F (36.7 C) (02/08 1620) Temp Source: Oral (02/08 1620) BP: 153/74 (02/08 1620) Pulse Rate: 95 (02/08 1620)  Labs: Recent Labs    02/28/21 1128 02/28/21 1806 03/01/21 0128 03/01/21 0423 03/01/21 0803 03/01/21 2137  HGB 12.7  --  10.7* 11.6*  --   --   HCT 40.0  --  32.6* 35.8*  --   --   PLT 176  --  154 139*  --   --   HEPARINUNFRC  --   --  0.40  --  0.13* <0.10*  CREATININE 1.95*  --  1.76* 1.70*  --   --   CKTOTAL  --   --  64  --   --   --   TROPONINIHS 188* 309* 127*  --   --   --     Estimated Creatinine Clearance: 25 mL/min (A) (by C-G formula based on SCr of 1.7 mg/dL (H)).  Assessment: 81 YO female admitted for weakness. PMH significant for aortic valve replacement, s/p CABG x2, CAD, T2DM, and CKD. Troponin elevated at 309. CBC stable. Not on anticoagulation prior to admission.   Heparin level is subtherapeutic (<0.1) on 950 units/hr, confirmed no infusion issues with RN. No bleeding noted. Cards plan is for medical management for now.  Goal of Therapy:  Heparin level 0.3-0.7 units/ml Monitor platelets by anticoagulation protocol: Yes   Plan:  Heparin 2000 unit IV bolus then increase gtt to  1150 units/hr 8h heparin level Daily heparin level, CBC Monitor for s/sx of bleeding  Thank you for involving pharmacy in this patient's care.  Renold Genta, PharmD, BCPS Clinical Pharmacist Clinical phone for 03/01/2021 until 10p is [phone] 03/01/2021 10:20 PM  **Pharmacist phone directory can be found on [email or web address] listed under [place]**

## 2021-03-01 NOTE — Progress Notes (Signed)
OT Cancellation Note  Patient Details Name: [name] MRN: [id number] DOB: May 22, 1940   Cancelled Treatment:    Reason Eval/Treat Not Completed: Patient at procedure or test/ unavailable (echo. will return later time)  [facility] 03/01/2021, 11:12 AM Maurie Boettcher, OT/L   Acute OT Clinical Specialist [place] Pager [phone] Office [phone]

## 2021-03-01 NOTE — Progress Notes (Signed)
BLE venous and ABI have been completed.  Results can be found under chart review under CV PROC. 03/01/2021 2:29 PM Rockwell Zentz RVT, RDMS

## 2021-03-01 NOTE — Progress Notes (Signed)
Inpatient Diabetes Program Recommendations  AACE/ADA: New Consensus Statement on Inpatient Glycemic Control (2015)  Target Ranges:  Prepandial:   less than 140 mg/dL      Peak postprandial:   less than 180 mg/dL (1-2 hours)      Critically ill patients:  140 - 180 mg/dL   Lab Results  Component Value Date   GLUCAP 178 (H) 03/01/2021   HGBA1C 11.6 (H) 03/01/2021   Review of Glycemic Control  Latest Reference Range & Units 03/01/21 01:11 03/01/21 03:46 03/01/21 08:05 03/01/21 11:35  Glucose-Capillary 70 - 99 mg/dL 266 (H) 213 (H) 153 (H) 178 (H)   Diabetes history: DM 2 Outpatient Diabetes medications: Glipizide 10 mg Daily, Lantus 10 units, Jardiance 10 mg Daily Current orders for Inpatient glycemic control:  Semglee 10 units Novolog 0-9 units Q4 hours   Pt discharged on 1/17 with Acute supraglottitis/epiglottitis was prescribed Decadron 4 mg Daily x7 days A1c 11.6% This admission higher than last but has been on steroids Diabetes Coordinator spoke with pt last admission on 1/16 about glucose control.  Trends lower today watch for now.   Thanks,  Tama Headings RN, MSN, BC-ADM Inpatient Diabetes Coordinator Team Pager [phone] (8a-5p)

## 2021-03-01 NOTE — Progress Notes (Signed)
Progress Note    [name]  FTD:[id number] DOB: 02/09/1940  DOA: 02/28/2021 PCP: [name], MD    Brief Narrative:     Medical records reviewed and are as summarized below:  [name] is an 81 y.o. female with medical history significant of DM2, HTN HLD, CAD sp CABG recent Covid.  Admitted for  elevated troponin and leg weakness  Assessment/Plan:   Principal Problem:   Influenza A Active Problems:   Hypothyroidism   Type 2 diabetes mellitus with diabetic neuropathy, without long-term current use of insulin (HCC)   Essential hypertension   Coronary atherosclerosis   GERD   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 female breast ([place])   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HCC)   PAD (peripheral artery disease) (HCC)   Hyperlipidemia   Diabetic peripheral neuropathy (HCC)   Bilateral lower extremity edema   Hyponatremia   Lower extremity weakness   Elevated troponin   Diplopia   Diplopia/LE weakness/reported "clumsiness"  -MRI brain -resolved symptoms  Influenza A Pt with recent infection - do no think active infection   Hypothyroidism -  continue home medications at current dose TSH slightly elevated     Type 2 diabetes mellitus with diabetic neuropathy, without long-term current use of insulin (HCC)  - SSI  continue lantus  Essential hypertension Continue metoprolol   PAD (peripheral artery disease) (HCC) Bilateral claudication order ABI   Bilateral lower extremity edema Possible side effect of lyrica Obtain dopplers to eval for DVT   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 female breast ([place]) Remote stable   GERD Continue PPI   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HCC)  -chronic avoid nephrotoxic medications such as NSAIDs, Vanco Zosyn combo,  avoid hypotension, continue to follow renal function   Hyperlipidemia Stable cont home meds   Lower extremity weakness Unclear etiology, given recent  viral illness GBS is on differential but unlikley given Reflexes intact and patient reports improvement overnight -MRI lumbar spine done -Other differential includes reaction to lyrica will hold -PT OT assessment   Diabetic peripheral neuropathy ([place]) Hold lyrica (per cards notes patient's issues started when lyrica strarted 2/4)   Hyponatremia -resolved   Elevated troponin  -no chest pain no EKG changes in the setting of  chronic kidney disease likely due to demand ischemia and poor clearance, monitor on telemetry and cycle cardiac enzymes to trend. -heparin gtt -cards consult    Coronary atherosclerosis  - chronic, continue aspirin and beta blocker -cards following        Family Communication/Anticipated D/C date and plan/Code Status   DVT prophylaxis: heparin gtt Code Status: DNR   Disposition Plan: Status is: Observation The patient will require care spanning > 2 midnights and should be moved to inpatient because: needs MRI brain, PT/OT eval           Medical Consultants:   cards  Subjective:   Thinks that overall she is doing better Does not like the food served in the ER  Objective:    Vitals:   03/01/21 0615 03/01/21 0800 03/01/21 1000 03/01/21 1100  BP:  110/80 (!) 160/100 (!) 152/78  Pulse: 97 91 (!) 103 99  Resp: 18 (!) 25 13   Temp:      TempSrc:      SpO2: 100% 98% 98% 100%  Weight:        Intake/Output Summary (Last 24 hours) at 03/01/2021  [place] filed at 02/28/2021 2119 Gross per 24 hour  Intake 1000 ml  Output --  Net 1000 ml   Filed Weights   02/28/21 1900  Weight: 66.7 kg    Exam:  General: Appearance:     Overweight female in no acute distress     Lungs:     respirations unlabored  Heart:    Normal heart rate.    MS:   All extremities are intact.    Neurologic:   Awake, alert     Data Reviewed:   I have personally reviewed following labs and imaging studies:  Labs: Labs show the following:   Basic  Metabolic Panel: Recent Labs  Lab 02/28/21 1128 03/01/21 0128 03/01/21 0423  NA 131* 135 136  K 5.0 3.5 3.4*  CL 96* 102 101  CO2 24 21* 24  GLUCOSE 424* 275* 183*  BUN 37* 32* 30*  CREATININE 1.95* 1.76* 1.70*  CALCIUM 10.2 9.1 9.1  MG  --  1.7 1.8  PHOS  --  3.7 3.9   GFR Estimated Creatinine Clearance: 25 mL/min (A) (by C-G formula based on SCr of 1.7 mg/dL (H)). Liver Function Tests: Recent Labs  Lab 03/01/21 0128 03/01/21 0423  AST 18 19  ALT 18 18  ALKPHOS 73 66  BILITOT 0.7 0.5  PROT 5.6* 5.8*  ALBUMIN 2.7* 2.8*   No results for input(s): LIPASE, AMYLASE in the last 168 hours. No results for input(s): AMMONIA in the last 168 hours. Coagulation profile No results for input(s): INR, PROTIME in the last 168 hours.  CBC: Recent Labs  Lab 02/28/21 1128 03/01/21 0128 03/01/21 0423  WBC 12.9* 10.8* 9.2  NEUTROABS  --  6.1 4.7  HGB 12.7 10.7* 11.6*  HCT 40.0 32.6* 35.8*  MCV 91.5 92.9 92.5  PLT 176 154 139*   Cardiac Enzymes: Recent Labs  Lab 03/01/21 0128  CKTOTAL 64   BNP (last 3 results) Recent Labs    09/01/20 1023 10/26/20 1137 02/21/21 1209  PROBNP 2,950* 2,308* 200.0*   CBG: Recent Labs  Lab 02/28/21 2126 03/01/21 0111 03/01/21 0346 03/01/21 0805 03/01/21 1135  GLUCAP 281* 266* 213* 153* 178*   D-Dimer: Recent Labs    03/01/21 0128  DDIMER 1.48*   Hgb A1c: Recent Labs    03/01/21 0128  HGBA1C 11.6*   Lipid Profile: No results for input(s): CHOL, HDL, LDLCALC, TRIG, CHOLHDL, LDLDIRECT in the last 72 hours. Thyroid function studies: Recent Labs    03/01/21 0423  TSH 5.599*   Anemia work up: No results for input(s): VITAMINB12, FOLATE, FERRITIN, TIBC, IRON, RETICCTPCT in the last 72 hours. Sepsis Labs: Recent Labs  Lab 02/28/21 1128 03/01/21 0128 03/01/21 0423  WBC 12.9* 10.8* 9.2    Microbiology Recent Results (from the past 240 hour(s))  Resp Panel by RT-PCR (Flu A&B, Covid) Nasopharyngeal Swab     Status:  Abnormal   Collection Time: 02/28/21  6:41 PM   Specimen: Nasopharyngeal Swab; Nasopharyngeal(NP) swabs in vial transport medium  Result Value Ref Range Status   SARS Coronavirus 2 by RT PCR NEGATIVE NEGATIVE Final    Comment: (NOTE) SARS-CoV-2 target nucleic acids are NOT DETECTED.  The SARS-CoV-2 RNA is generally detectable in upper respiratory specimens during the acute phase of infection. The lowest concentration of SARS-CoV-2 viral copies this assay can detect is 138 copies/mL. A negative result does not preclude SARS-Cov-2 infection and should not be used as the sole basis for treatment or other patient management  decisions. A negative result may occur with  improper specimen collection/handling, submission of specimen other than nasopharyngeal swab, presence of viral mutation(s) within the areas targeted by this assay, and inadequate number of viral copies(<138 copies/mL). A negative result must be combined with clinical observations, patient history, and epidemiological information. The expected result is Negative.  Fact Sheet for Patients:  EntrepreneurPulse.com.au  Fact Sheet for Healthcare Providers:  IncredibleEmployment.be  This test is no t yet approved or cleared by the [place] FDA and  has been authorized for detection and/or diagnosis of SARS-CoV-2 by FDA under an Emergency Use Authorization (EUA). This EUA will remain  in effect (meaning this test can be used) for the duration of the COVID-19 declaration under Section 564(b)(1) of the Act, 21 U.S.C.section 360bbb-3(b)(1), unless the authorization is terminated  or revoked sooner.       Influenza A by PCR POSITIVE (A) NEGATIVE Final   Influenza B by PCR NEGATIVE NEGATIVE Final    Comment: (NOTE) The Xpert Xpress SARS-CoV-2/FLU/RSV plus assay is intended as an aid in the diagnosis of influenza from Nasopharyngeal swab specimens and should not be used as a sole basis  for treatment. Nasal washings and aspirates are unacceptable for Xpert Xpress SARS-CoV-2/FLU/RSV testing.  Fact Sheet for Patients: EntrepreneurPulse.com.au  Fact Sheet for Healthcare Providers: IncredibleEmployment.be  This test is not yet approved or cleared by the [place] FDA and has been authorized for detection and/or diagnosis of SARS-CoV-2 by FDA under an Emergency Use Authorization (EUA). This EUA will remain in effect (meaning this test can be used) for the duration of the COVID-19 declaration under Section 564(b)(1) of the Act, 21 U.S.C. section 360bbb-3(b)(1), unless the authorization is terminated or revoked.  Performed at [facility], [place] [address], [place], [place] [address]     Procedures and diagnostic studies:  CT Head Wo Contrast  Result Date: 02/28/2021 CLINICAL DATA:  Head trauma.  Weakness. EXAM: CT HEAD WITHOUT CONTRAST TECHNIQUE: Contiguous axial images were obtained from the base of the skull through the vertex without intravenous contrast. RADIATION DOSE REDUCTION: This exam was performed according to the departmental dose-optimization program which includes automated exposure control, adjustment of the mA and/or kV according to patient size and/or use of iterative reconstruction technique. COMPARISON:  Head MRI 11/05/2017 FINDINGS: Brain: There is no evidence of an acute infarct, intracranial hemorrhage, mass, midline shift, or extra-axial fluid collection. The ventricles and sulci are within normal limits for age. Hypodensities in the cerebral white matter nonspecific but compatible with mild chronic small vessel ischemic disease. Vascular: Calcified atherosclerosis at the skull base. No hyperdense vessel. Skull: No acute fracture or suspicious osseous lesion. Sinuses/Orbits: Visualized paranasal sinuses and mastoid air cells are clear. Visualized orbits are unremarkable. Other: None. IMPRESSION: 1. No evidence of  acute intracranial abnormality. 2. Mild chronic small vessel ischemic disease. Electronically Signed   By: Logan Bores M.D.   On: 02/28/2021 11:58   MR THORACIC SPINE WO CONTRAST  Result Date: 03/01/2021 CLINICAL DATA:  Initial evaluation for low back pain, cauda equina syndrome. EXAM: MRI THORACIC AND LUMBAR SPINE WITHOUT CONTRAST TECHNIQUE: Multiplanar and multiecho pulse sequences of the thoracic and lumbar spine were obtained without intravenous contrast. COMPARISON:  None. FINDINGS: MRI THORACIC SPINE FINDINGS Alignment: Mild dextroscoliosis. Alignment otherwise normal with preservation of the normal thoracic kyphosis. No listhesis. Vertebrae: Vertebral body height maintained without acute or chronic fracture. Bone marrow signal intensity within normal limits. No discrete or worrisome osseous lesions or abnormal marrow  edema. Cord:  Normal signal and morphology. Paraspinal and other soft tissues: Unremarkable. Disc levels: Normal for age multilevel disc desiccation seen throughout the thoracic spine. No significant disc bulge or focal disc herniation. No significant facet pathology. No canal or neural foraminal stenosis or evidence for neural impingement. MRI LUMBAR SPINE FINDINGS Segmentation:  Examination degraded by motion artifact. Standard segmentation. Lowest well-formed disc space labeled the L5-S1 level. Alignment: Dextroscoliosis with straightening of the normal lumbar lordosis. Trace degenerative retrolisthesis of L3 on L4 through L5 on S1. Vertebrae: Vertebral body height maintained without acute or chronic fracture. Bone marrow signal intensity within normal limits. No worrisome osseous lesions. Discogenic reactive endplate change present about the L3-4 interspace. No other abnormal marrow edema. Conus medullaris and cauda equina: Conus extends to the L1 level. Conus and cauda equina appear normal. Paraspinal and other soft tissues: Chronic postoperative changes present within the posterior  paraspinous soft tissues. No adverse features. Few scattered benign appearing cyst noted about the kidneys. Visualized visceral structures otherwise unremarkable. Disc levels: L1-2: Mild disc bulge with disc desiccation and reactive endplate spurring. Disc bulging asymmetric to the right. Mild facet and ligament flavum hypertrophy. No significant spinal stenosis. Foramina remain patent. L2-3: Mild intervertebral disc space narrowing with diffuse disc bulge and disc desiccation. Associated reactive endplate spurring. Changes asymmetric to the left. Mild to moderate bilateral facet hypertrophy. Resultant mild to moderate canal with left lateral recess stenosis. Mild left L2 foraminal narrowing. Right neural foramen remains patent. L3-4: Degenerative intervertebral disc space narrowing with disc desiccation and diffuse disc bulge. Associated discogenic reactive endplate change with marginal endplate osteophytic spurring. Moderate bilateral facet hypertrophy. Probable remote posterior decompression. Persistent moderate to severe canal with left worse than right lateral recess stenosis. Moderate bilateral L3 foraminal narrowing. L4-5: Mild disc bulge with disc desiccation, asymmetric to the right. Associated reactive endplate spurring, also worse on the right. Severe right worse than left facet arthrosis. Prior posterior decompression. Residual mild narrowing of the lateral recesses bilaterally. Central canal remains patent. Moderate right with mild left L4 foraminal stenosis. L5-S1: Mild disc bulge with disc desiccation. Moderate left worse than right facet arthrosis. Prior posterior decompression. No significant spinal stenosis. Moderate right with mild left L5 foraminal narrowing. IMPRESSION: MR THORACIC SPINE IMPRESSION: No acute abnormality within the thoracic spine. No significant disc pathology or stenosis. No neural impingement. MR LUMBAR SPINE IMPRESSION: 1. No acute abnormality within the lumbar spine. 2.  Postoperative changes from prior posterior decompression at L3-4 through L5-S1. Persistent moderate to severe canal with left worse than right lateral recess stenosis at the L3-4 level with moderate bilateral L3 foraminal narrowing. 3. Right eccentric disc osteophyte and facet arthrosis at L4-5 and L5-S1 with resultant moderate right L4 and L5 foraminal stenosis. 4. Left eccentric disc bulge and facet hypertrophy at L2-3 with resultant mild to moderate canal and left lateral recess stenosis. Electronically Signed   By: Jeannine Boga M.D.   On: 03/01/2021 00:11   MR LUMBAR SPINE WO CONTRAST  Result Date: 03/01/2021 CLINICAL DATA:  Initial evaluation for low back pain, cauda equina syndrome. EXAM: MRI THORACIC AND LUMBAR SPINE WITHOUT CONTRAST TECHNIQUE: Multiplanar and multiecho pulse sequences of the thoracic and lumbar spine were obtained without intravenous contrast. COMPARISON:  None. FINDINGS: MRI THORACIC SPINE FINDINGS Alignment: Mild dextroscoliosis. Alignment otherwise normal with preservation of the normal thoracic kyphosis. No listhesis. Vertebrae: Vertebral body height maintained without acute or chronic fracture. Bone marrow signal intensity within normal limits. No discrete or worrisome osseous  lesions or abnormal marrow edema. Cord:  Normal signal and morphology. Paraspinal and other soft tissues: Unremarkable. Disc levels: Normal for age multilevel disc desiccation seen throughout the thoracic spine. No significant disc bulge or focal disc herniation. No significant facet pathology. No canal or neural foraminal stenosis or evidence for neural impingement. MRI LUMBAR SPINE FINDINGS Segmentation:  Examination degraded by motion artifact. Standard segmentation. Lowest well-formed disc space labeled the L5-S1 level. Alignment: Dextroscoliosis with straightening of the normal lumbar lordosis. Trace degenerative retrolisthesis of L3 on L4 through L5 on S1. Vertebrae: Vertebral body height  maintained without acute or chronic fracture. Bone marrow signal intensity within normal limits. No worrisome osseous lesions. Discogenic reactive endplate change present about the L3-4 interspace. No other abnormal marrow edema. Conus medullaris and cauda equina: Conus extends to the L1 level. Conus and cauda equina appear normal. Paraspinal and other soft tissues: Chronic postoperative changes present within the posterior paraspinous soft tissues. No adverse features. Few scattered benign appearing cyst noted about the kidneys. Visualized visceral structures otherwise unremarkable. Disc levels: L1-2: Mild disc bulge with disc desiccation and reactive endplate spurring. Disc bulging asymmetric to the right. Mild facet and ligament flavum hypertrophy. No significant spinal stenosis. Foramina remain patent. L2-3: Mild intervertebral disc space narrowing with diffuse disc bulge and disc desiccation. Associated reactive endplate spurring. Changes asymmetric to the left. Mild to moderate bilateral facet hypertrophy. Resultant mild to moderate canal with left lateral recess stenosis. Mild left L2 foraminal narrowing. Right neural foramen remains patent. L3-4: Degenerative intervertebral disc space narrowing with disc desiccation and diffuse disc bulge. Associated discogenic reactive endplate change with marginal endplate osteophytic spurring. Moderate bilateral facet hypertrophy. Probable remote posterior decompression. Persistent moderate to severe canal with left worse than right lateral recess stenosis. Moderate bilateral L3 foraminal narrowing. L4-5: Mild disc bulge with disc desiccation, asymmetric to the right. Associated reactive endplate spurring, also worse on the right. Severe right worse than left facet arthrosis. Prior posterior decompression. Residual mild narrowing of the lateral recesses bilaterally. Central canal remains patent. Moderate right with mild left L4 foraminal stenosis. L5-S1: Mild disc bulge  with disc desiccation. Moderate left worse than right facet arthrosis. Prior posterior decompression. No significant spinal stenosis. Moderate right with mild left L5 foraminal narrowing. IMPRESSION: MR THORACIC SPINE IMPRESSION: No acute abnormality within the thoracic spine. No significant disc pathology or stenosis. No neural impingement. MR LUMBAR SPINE IMPRESSION: 1. No acute abnormality within the lumbar spine. 2. Postoperative changes from prior posterior decompression at L3-4 through L5-S1. Persistent moderate to severe canal with left worse than right lateral recess stenosis at the L3-4 level with moderate bilateral L3 foraminal narrowing. 3. Right eccentric disc osteophyte and facet arthrosis at L4-5 and L5-S1 with resultant moderate right L4 and L5 foraminal stenosis. 4. Left eccentric disc bulge and facet hypertrophy at L2-3 with resultant mild to moderate canal and left lateral recess stenosis. Electronically Signed   By: Jeannine Boga M.D.   On: 03/01/2021 00:11   DG Chest Port 1 View  Result Date: 02/28/2021 CLINICAL DATA:  Weakness EXAM: PORTABLE CHEST 1 VIEW COMPARISON:  01/20/2021 FINDINGS: Prior valve replacement and CABG. Insert COPD Heart and mediastinal contours are within normal limits. No focal opacities or effusions. No acute bony abnormality. IMPRESSION: COPD.  No active disease. Electronically Signed   By: Rolm Baptise M.D.   On: 02/28/2021 19:21    Medications:    aspirin EC  81 mg Oral q AM   insulin aspart  0-9  Units Subcutaneous Q4H   insulin glargine-yfgn  10 Units Subcutaneous QHS   levothyroxine  100 mcg Oral Q0600   metoprolol tartrate  12.5 mg Oral BID   sodium chloride flush  3 mL Intravenous Q12H   Continuous Infusions:  sodium chloride     heparin 950 Units/hr (03/01/21 0910)     LOS: 0 days   Geradine Girt  [facility]   How to contact the [facility] Attending or Consulting provider [place] or covering provider during after hours [place], for  this patient?  Check the care team in [facility] and look for a) attending/consulting [facility] provider listed and b) the [facility] team listed Log into [email or web address] and use [place]'s universal password to access. If you do not have the password, please contact the hospital operator. Locate the [facility] provider you are looking for under [facility] and page to a number that you can be directly reached. If you still have difficulty reaching the provider, please page the [facility] (Director on Call) for the Hospitalists listed on amion for assistance.  03/01/2021, 1:09 PM

## 2021-03-01 NOTE — Progress Notes (Signed)
NEW ADMISSION NOTE New Admission Note:   Arrival Method: stretcher Mental Orientation: A&OX4 Telemetry: 5M22 Assessment: Completed Skin: intact red sacrum IV: RAC Pain:0/10 Tubes: NONE Safety Measures: Safety Fall Prevention Plan has been given, discussed and signed Admission: Completed [facility] [place] Orientation: Patient has been orientated to the room, unit and staff.  Family:none at bedside  Orders have been reviewed and implemented. Will continue to monitor the patient. Call light has been placed within reach and bed alarm has been activated.   Alaisa Moffitt S Safina Huard, RN

## 2021-03-01 NOTE — Progress Notes (Signed)
[place] for heparin Indication: chest pain/ACS  Allergies  Allergen Reactions   Codeine Nausea And Vomiting and Other (See Comments)    Severe stomach cramps, also   Antihistamines, Diphenhydramine-Type Other (See Comments)    Causes hyperactivity   Gabapentin     Urinary incontinence   Statins Other (See Comments)    Joint pains   Sulfa Antibiotics Other (See Comments)    Reaction not recalled   Erythromycin Hives, Rash and Other (See Comments)    Allergic due to dental work from 50 years ago. It was used in packing and resulted in rash/hives inside and outside of mouth.   Trulicity [Dulaglutide] Itching    Patient Measurements: Weight: 66.7 kg (147 lb 0.8 oz) Heparin Dosing Weight: 65.9 kg   Vital Signs: BP: 110/80 (02/08 0800) Pulse Rate: 91 (02/08 0800)  Labs: Recent Labs    02/28/21 1128 02/28/21 1806 03/01/21 0128 03/01/21 0423 03/01/21 0803  HGB 12.7  --  10.7* 11.6*  --   HCT 40.0  --  32.6* 35.8*  --   PLT 176  --  154 139*  --   HEPARINUNFRC  --   --  0.40  --  0.13*  CREATININE 1.95*  --  1.76* 1.70*  --   CKTOTAL  --   --  64  --   --   TROPONINIHS 188* 309* 127*  --   --      Estimated Creatinine Clearance: 25 mL/min (A) (by C-G formula based on SCr of 1.7 mg/dL (H)).   Medical History: Past Medical History:  Diagnosis Date   Allergy    Anemia    childhood   Anxiety    Aortic stenosis    Arterial stenosis (HCC)    mesenteric   Arthritis    Breast cancer ([place]) 12/05/10   R breast, inv mammary, in situ,ER/PR +,HER2 -   Cancer ([place])    Carcinoma of breast treated with adjuvant chemotherapy ([place])    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[place])    Claudication ([place])    Coronary artery disease    stent 2007   Depression    Diabetes mellitus    Difficulty sleeping    Diverticulosis 12/10/03   Elevated cholesterol    Generalized weakness    GERD (gastroesophageal reflux disease)    Heart  murmur    Hepatitis    as an infant   HSV-1 (herpes simplex virus 1) infection    Hyperlipidemia    Hypertension    Hypothyroidism    Osteoarthritis    PAD (peripheral artery disease) ([place])    Personal history of chemotherapy    Personal history of radiation therapy    Pneumonia    2014 september and March 2022   Rheumatoid arthritis ([place])    S/P aortic valve replacement with bioprosthetic valve 05/04/2020   [facility] Inspiris Resilia stented bovine pericardial tissue valve, size 23 mm   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 PMH significant for aortic valve replacement, s/p CABG x2, CAD, T2DM, and CKD. Troponin elevated at 309. CBC stable. Not on anticoagulation prior to admission.   Heparin level this AM drifted down to subtherapeutic on 800 units/hr, confirmed  no infusion issues at this time, cards plan for medical management for now  Goal of Therapy:  Heparin level 0.3-0.7 units/ml Monitor platelets by anticoagulation protocol: Yes   Plan:  Increase heparin gtt to 950 units/hr F/u 8 hour heparin level to confirm  Bertis Ruddy, PharmD Clinical Pharmacist ED Pharmacist Phone # [phone] 03/01/2021 9:02 AM

## 2021-03-01 NOTE — Assessment & Plan Note (Signed)
-  chronic avoid nephrotoxic medications such as NSAIDs, Vanco Zosyn combo,  avoid hypotension, continue to follow renal function  

## 2021-03-01 NOTE — Progress Notes (Signed)
PT Cancellation Note  Patient Details Name: [name] MRN: [id number] DOB: 12-27-1940   Cancelled Treatment:    Reason Eval/Treat Not Completed: Patient at procedure or test/unavailable Pt currently at MRI. Will follow up as schedule allows.   Lou Miner, DPT  [facility]  Pager: [phone] Office: [phone]    Rudean Hitt 03/01/2021, 3:56 PM

## 2021-03-01 NOTE — Assessment & Plan Note (Addendum)
-   Order Sensitive SSI   continue lantus  -  check TSH and HgA1C  - Hold by mouth medications

## 2021-03-01 NOTE — Assessment & Plan Note (Signed)
-  no chest pain no EKG changes in the setting of  chronic kidney disease likely due to demand ischemia and poor clearance, monitor on telemetry and cycle cardiac enzymes to trend.  if continues to rise will need further work-up Echo in AM cardiology consulted and rec starting heparin  continue to cycle troponin

## 2021-03-01 NOTE — Progress Notes (Signed)
PT Cancellation Note  Patient Details Name: [name] MRN: [id number] DOB: 08-27-1940   Cancelled Treatment:    Reason Eval/Treat Not Completed: Patient at procedure or test/unavailable. Will follow up as schedule allows. Jonne Ply, SPT   [name] 03/01/2021, 11:13 AM

## 2021-03-01 NOTE — Assessment & Plan Note (Signed)
-   chronic, continue aspirin and beta blocker*

## 2021-03-02 ENCOUNTER — Telehealth: Payer: Medicare Other

## 2021-03-02 ENCOUNTER — Encounter (HOSPITAL_COMMUNITY): Payer: Self-pay | Admitting: Internal Medicine

## 2021-03-02 ENCOUNTER — Telehealth: Payer: Self-pay | Admitting: Cardiovascular Disease

## 2021-03-02 DIAGNOSIS — R9439 Abnormal result of other cardiovascular function study: Secondary | ICD-10-CM

## 2021-03-02 DIAGNOSIS — Z7982 Long term (current) use of aspirin: Secondary | ICD-10-CM

## 2021-03-02 DIAGNOSIS — J101 Influenza due to other identified influenza virus with other respiratory manifestations: Secondary | ICD-10-CM | POA: Diagnosis not present

## 2021-03-02 DIAGNOSIS — I739 Peripheral vascular disease, unspecified: Secondary | ICD-10-CM

## 2021-03-02 DIAGNOSIS — Z87891 Personal history of nicotine dependence: Secondary | ICD-10-CM

## 2021-03-02 DIAGNOSIS — S91102A Unspecified open wound of left great toe without damage to nail, initial encounter: Secondary | ICD-10-CM

## 2021-03-02 DIAGNOSIS — R778 Other specified abnormalities of plasma proteins: Secondary | ICD-10-CM | POA: Diagnosis not present

## 2021-03-02 LAB — GLUCOSE, CAPILLARY
Glucose-Capillary: 223 mg/dL — ABNORMAL HIGH (ref 70–99)
Glucose-Capillary: 157 mg/dL — ABNORMAL HIGH (ref 70–99)
Glucose-Capillary: 167 mg/dL — ABNORMAL HIGH (ref 70–99)
Glucose-Capillary: 253 mg/dL — ABNORMAL HIGH (ref 70–99)

## 2021-03-02 LAB — BASIC METABOLIC PANEL
Anion gap: 13 (ref 5–15)
BUN: 28 mg/dL — ABNORMAL HIGH (ref 8–23)
CO2: 21 mmol/L — ABNORMAL LOW (ref 22–32)
Calcium: 8.7 mg/dL — ABNORMAL LOW (ref 8.9–10.3)
Chloride: 101 mmol/L (ref 98–111)
Creatinine, Ser: 1.58 mg/dL — ABNORMAL HIGH (ref 0.44–1.00)
GFR, Estimated: 33 mL/min — ABNORMAL LOW (ref >60)
Glucose, Bld: 185 mg/dL — ABNORMAL HIGH (ref 70–99)
Potassium: 4 mmol/L (ref 3.5–5.1)
Sodium: 135 mmol/L (ref 135–145)

## 2021-03-02 LAB — HEPARIN LEVEL (UNFRACTIONATED): Heparin Unfractionated: 0.45 IU/mL (ref 0.30–0.70)

## 2021-03-02 LAB — CBC
HCT: 37.5 % (ref 36.0–46.0)
Hemoglobin: 12.7 g/dL (ref 12.0–15.0)
MCH: 30.4 pg (ref 26.0–34.0)
MCHC: 33.9 g/dL (ref 30.0–36.0)
MCV: 89.7 fL (ref 80.0–100.0)
Platelets: 174 10*3/uL (ref 150–400)
RBC: 4.18 MIL/uL (ref 3.87–5.11)
RDW: 15.1 % (ref 11.5–15.5)
WBC: 8.1 10*3/uL (ref 4.0–10.5)
nRBC: 0 % (ref 0.0–0.2)

## 2021-03-02 LAB — VITAMIN B12: Vitamin B-12: 422 pg/mL (ref 180–914)

## 2021-03-02 MED ORDER — ADULT MULTIVITAMIN W/MINERALS CH
1.0000 | ORAL_TABLET | Freq: Every day | ORAL | Status: DC
  Administered 2021-03-02 – 2021-03-13 (×12): 1 via ORAL
  Filled 2021-03-02 (×12): qty 1

## 2021-03-02 MED ORDER — BACITRACIN ZINC 500 UNIT/GM EX OINT
TOPICAL_OINTMENT | Freq: Two times a day (BID) | CUTANEOUS | Status: AC
Start: 2021-03-02 — End: 2021-03-09
  Administered 2021-03-04: 1 via TOPICAL
  Filled 2021-03-02: qty 28.4

## 2021-03-02 MED ORDER — ENSURE ENLIVE PO LIQD
237.0000 mL | Freq: Two times a day (BID) | ORAL | Status: DC
  Administered 2021-03-02 – 2021-03-12 (×13): 237 mL via ORAL

## 2021-03-02 NOTE — Progress Notes (Signed)
Progress Note    [name]  DDU:[id number] DOB: [birth year]  DOA: 02/28/2021 PCP: [name], MD    Brief Narrative:     Medical records reviewed and are as summarized below:  [name] is an 81 y.o. female with medical history significant of DM2, HTN HLD, CAD sp CABG recent Covid.  Admitted for  elevated troponin and leg weakness.  Found to have worsening ABIs and non-healing foot wounds.  Now plan for repeat arteriogram.   Assessment/Plan:   Principal Problem:   Influenza A Active Problems:   Hypothyroidism   Type 2 diabetes mellitus with diabetic neuropathy, without long-term current use of insulin (HCC)   Essential hypertension   Coronary atherosclerosis   GERD   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 female breast ([place])   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HCC)   PAD (peripheral artery disease) (HCC)   Hyperlipidemia   Diabetic peripheral neuropathy (HCC)   Bilateral lower extremity edema   Hyponatremia   Lower extremity weakness   Elevated troponin   Diplopia   Leg weakness   Diplopia/LE weakness/reported "clumsiness"  -MRI brain negative -resolved symptoms -suspect related to [facility]  PAD (peripheral artery disease) (HCC) Bilateral claudication  -ABI abnormal -vascular consult- plan for aortogram, lower extremity arteriogram, and possible intervention to optimize her chances of wound healing in AM   Influenza A Pt with recent infection - do not think active infection   Hypothyroidism -  continue home medications at current dose TSH slightly elevated   Type 2 diabetes mellitus with diabetic neuropathy, without long-term current use of insulin (HCC)  - SSI  continue lantus  Essential hypertension Continue metoprolol   Bilateral lower extremity edema Possible side effect of lyrica Negative for DVT   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 female breast ([place]) Remote stable   GERD Continue PPI   CKD  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 (HCC)  -chronic avoid nephrotoxic medications such as NSAIDs, Vanco Zosyn combo,  avoid hypotension, continue to follow renal function   Hyperlipidemia Stable cont home meds   Lower extremity weakness Unclear etiology, given recent viral illness GBS is on differential but unlikley given Reflexes intact and patient reports improvement overnight -MRI lumbar spine done -Other differential includes reaction to lyrica will hold -PT OT assessment   Diabetic peripheral neuropathy ([place]) Hold lyrica (per cards notes patient's issues started when lyrica strarted 2/4)   Hyponatremia -resolved   Elevated troponin/NSTEMI  -no chest pain no EKG changes in the setting of  chronic kidney disease likely due to demand ischemia and poor clearance, monitor on telemetry and cycle cardiac enzymes to trend. -heparin gtt x 48 hours -cards consult    Coronary atherosclerosis  - chronic, continue aspirin and beta blocker -cards following        Family Communication/Anticipated D/C date and plan/Code Status   DVT prophylaxis: heparin gtt Code Status: DNR   Disposition Plan: Status is: inpt The patient will require care spanning > 2 midnights and should be moved to inpatient because: needs MRI brain, PT/OT eval           Medical Consultants:   Cards vascular  Subjective:   C/o foot pain still  Objective:    Vitals:   03/02/21 0235 03/02/21 0633 03/02/21 0633 03/02/21 0831  BP: (!) 161/80 136/76 136/76 (!) 143/62  Pulse: (!) 107 96 98 96  Resp: 17 18 18  14  Temp: 98 F (36.7 C) (!) 97.4 F (36.3 C) (!) 97.4 F (36.3 C) 98 F (36.7 C)  TempSrc:  Oral Oral Oral  SpO2: 95% 97% 97% 93%  Weight:        Intake/Output Summary (Last 24 hours) at 03/02/2021 1322 Last data filed at 03/02/2021 0900 Gross per 24 hour  Intake 460 ml  Output --  Net 460 ml   Filed Weights   02/28/21 1900  Weight: 66.7 kg    Exam:   General: Appearance:      Overweight female in no acute distress     Lungs:      respirations unlabored  Heart:    Normal heart rate.    MS:   All extremities are intact.    Neurologic:   Awake, alert, oriented x 3. No apparent focal neurological           defect.        Data Reviewed:   I have personally reviewed following labs and imaging studies:  Labs: Labs show the following:   Basic Metabolic Panel: Recent Labs  Lab 02/28/21 1128 03/01/21 0128 03/01/21 0423 03/02/21 0042  NA 131* 135 136 135  K 5.0 3.5 3.4* 4.0  CL 96* 102 101 101  CO2 24 21* 24 21*  GLUCOSE 424* 275* 183* 185*  BUN 37* 32* 30* 28*  CREATININE 1.95* 1.76* 1.70* 1.58*  CALCIUM 10.2 9.1 9.1 8.7*  MG  --  1.7 1.8  --   PHOS  --  3.7 3.9  --    GFR Estimated Creatinine Clearance: 26.9 mL/min (A) (by C-G formula based on SCr of 1.58 mg/dL (H)). Liver Function Tests: Recent Labs  Lab 03/01/21 0128 03/01/21 0423  AST 18 19  ALT 18 18  ALKPHOS 73 66  BILITOT 0.7 0.5  PROT 5.6* 5.8*  ALBUMIN 2.7* 2.8*   No results for input(s): LIPASE, AMYLASE in the last 168 hours. No results for input(s): AMMONIA in the last 168 hours. Coagulation profile No results for input(s): INR, PROTIME in the last 168 hours.  CBC: Recent Labs  Lab 02/28/21 1128 03/01/21 0128 03/01/21 0423 03/02/21 0640  WBC 12.9* 10.8* 9.2 8.1  NEUTROABS  --  6.1 4.7  --   HGB 12.7 10.7* 11.6* 12.7  HCT 40.0 32.6* 35.8* 37.5  MCV 91.5 92.9 92.5 89.7  PLT 176 154 139* 174   Cardiac Enzymes: Recent Labs  Lab 03/01/21 0128  CKTOTAL 64   BNP (last 3 results) Recent Labs    09/01/20 1023 10/26/20 1137 02/21/21 1209  PROBNP 2,950* 2,308* 200.0*   CBG: Recent Labs  Lab 03/01/21 1135 03/01/21 1619 03/01/21 2205 03/02/21 0728 03/02/21 1151  GLUCAP 178* 128* 199* 223* 157*   D-Dimer: Recent Labs    03/01/21 0128  DDIMER 1.48*   Hgb A1c: Recent Labs    03/01/21 0128  HGBA1C 11.6*   Lipid Profile: No results for input(s):  CHOL, HDL, LDLCALC, TRIG, CHOLHDL, LDLDIRECT in the last 72 hours. Thyroid function studies: Recent Labs    03/01/21 0423  TSH 5.599*   Anemia work up: Recent Labs    03/02/21 0042  VITAMINB12 422   Sepsis Labs: Recent Labs  Lab 02/28/21 1128 03/01/21 0128 03/01/21 0423 03/02/21 0640  WBC 12.9* 10.8* 9.2 8.1    Microbiology Recent Results (from the past 240 hour(s))  Resp Panel by RT-PCR (Flu A&B, Covid) Nasopharyngeal Swab     Status: Abnormal   Collection Time: 02/28/21  6:41 PM   Specimen: Nasopharyngeal Swab; Nasopharyngeal(NP) swabs in vial transport medium  Result Value Ref Range Status   SARS Coronavirus 2 by RT PCR NEGATIVE NEGATIVE Final    Comment: (NOTE) SARS-CoV-2 target nucleic acids are NOT DETECTED.  The SARS-CoV-2 RNA is generally detectable in upper respiratory specimens during the acute phase of infection. The lowest concentration of SARS-CoV-2 viral copies this assay can detect is 138 copies/mL. A negative result does not preclude SARS-Cov-2 infection and should not be used as the sole basis for treatment or other patient management decisions. A negative result may occur with  improper specimen collection/handling, submission of specimen other than nasopharyngeal swab, presence of viral mutation(s) within the areas targeted by this assay, and inadequate number of viral copies(<138 copies/mL). A negative result must be combined with clinical observations, patient history, and epidemiological information. The expected result is Negative.  Fact Sheet for Patients:  EntrepreneurPulse.com.au  Fact Sheet for Healthcare Providers:  IncredibleEmployment.be  This test is no t yet approved or cleared by the [place] FDA and  has been authorized for detection and/or diagnosis of SARS-CoV-2 by FDA under an Emergency Use Authorization (EUA). This EUA will remain  in effect (meaning this test can be used) for the  duration of the COVID-19 declaration under Section 564(b)(1) of the Act, 21 U.S.C.section 360bbb-3(b)(1), unless the authorization is terminated  or revoked sooner.       Influenza A by PCR POSITIVE (A) NEGATIVE Final   Influenza B by PCR NEGATIVE NEGATIVE Final    Comment: (NOTE) The Xpert Xpress SARS-CoV-2/FLU/RSV plus assay is intended as an aid in the diagnosis of influenza from Nasopharyngeal swab specimens and should not be used as a sole basis for treatment. Nasal washings and aspirates are unacceptable for Xpert Xpress SARS-CoV-2/FLU/RSV testing.  Fact Sheet for Patients: EntrepreneurPulse.com.au  Fact Sheet for Healthcare Providers: IncredibleEmployment.be  This test is not yet approved or cleared by the [place] FDA and has been authorized for detection and/or diagnosis of SARS-CoV-2 by FDA under an Emergency Use Authorization (EUA). This EUA will remain in effect (meaning this test can be used) for the duration of the COVID-19 declaration under Section 564(b)(1) of the Act, 21 U.S.C. section 360bbb-3(b)(1), unless the authorization is terminated or revoked.  Performed at [facility], [place] [address] [place], [place] [address]     Procedures and diagnostic studies:  MR BRAIN WO CONTRAST  Result Date: 03/01/2021 CLINICAL DATA:  [place]ipl[place] EXAM: MRI HEAD WITHOUT CONTRAST TECHNIQUE: Multiplanar, multiecho pulse sequences of the brain and surrounding structures were obtained without intravenous contrast. COMPARISON:  2019 FINDINGS: Brain: There is no acute infarction or intracranial hemorrhage. There is no intracranial mass, mass effect, or edema. There is no hydrocephalus or extra-axial fluid collection. Prominence of ventricles and sulci reflects parenchymal volume loss. Patchy T2 hyperintensity in the supratentorial white matter is nonspecific but may reflect mild chronic microvascular ischemic changes. Vascular: Major vessel  flow voids at the skull base are preserved. Skull and upper cervical spine: Normal marrow signal is preserved. Sinuses/Orbits: Paranasal sinuses are aerated. Orbits are unremarkable. Other: Sella is unremarkable.  Mastoid air cells are clear. IMPRESSION: No evidence of recent infarction, hemorrhage, or mass. Mild chronic microvascular ischemic changes. Electronically Signed   By: Macy Mis M.D.   On: 03/01/2021 16:15   MR THORACIC SPINE WO CONTRAST  Result Date: 03/01/2021 CLINICAL DATA:  Initial evaluation for low back pain, cauda equina syndrome. EXAM: MRI THORACIC AND LUMBAR SPINE  WITHOUT CONTRAST TECHNIQUE: Multiplanar and multiecho pulse sequences of the thoracic and lumbar spine were obtained without intravenous contrast. COMPARISON:  None. FINDINGS: MRI THORACIC SPINE FINDINGS Alignment: Mild dextroscoliosis. Alignment otherwise normal with preservation of the normal thoracic kyphosis. No listhesis. Vertebrae: Vertebral body height maintained without acute or chronic fracture. Bone marrow signal intensity within normal limits. No discrete or worrisome osseous lesions or abnormal marrow edema. Cord:  Normal signal and morphology. Paraspinal and other soft tissues: Unremarkable. Disc levels: Normal for age multilevel disc desiccation seen throughout the thoracic spine. No significant disc bulge or focal disc herniation. No significant facet pathology. No canal or neural foraminal stenosis or evidence for neural impingement. MRI LUMBAR SPINE FINDINGS Segmentation:  Examination degraded by motion artifact. Standard segmentation. Lowest well-formed disc space labeled the L5-S1 level. Alignment: Dextroscoliosis with straightening of the normal lumbar lordosis. Trace degenerative retrolisthesis of L3 on L4 through L5 on S1. Vertebrae: Vertebral body height maintained without acute or chronic fracture. Bone marrow signal intensity within normal limits. No worrisome osseous lesions. Discogenic reactive  endplate change present about the L3-4 interspace. No other abnormal marrow edema. Conus medullaris and cauda equina: Conus extends to the L1 level. Conus and cauda equina appear normal. Paraspinal and other soft tissues: Chronic postoperative changes present within the posterior paraspinous soft tissues. No adverse features. Few scattered benign appearing cyst noted about the kidneys. Visualized visceral structures otherwise unremarkable. Disc levels: L1-2: Mild disc bulge with disc desiccation and reactive endplate spurring. Disc bulging asymmetric to the right. Mild facet and ligament flavum hypertrophy. No significant spinal stenosis. Foramina remain patent. L2-3: Mild intervertebral disc space narrowing with diffuse disc bulge and disc desiccation. Associated reactive endplate spurring. Changes asymmetric to the left. Mild to moderate bilateral facet hypertrophy. Resultant mild to moderate canal with left lateral recess stenosis. Mild left L2 foraminal narrowing. Right neural foramen remains patent. L3-4: Degenerative intervertebral disc space narrowing with disc desiccation and diffuse disc bulge. Associated discogenic reactive endplate change with marginal endplate osteophytic spurring. Moderate bilateral facet hypertrophy. Probable remote posterior decompression. Persistent moderate to severe canal with left worse than right lateral recess stenosis. Moderate bilateral L3 foraminal narrowing. L4-5: Mild disc bulge with disc desiccation, asymmetric to the right. Associated reactive endplate spurring, also worse on the right. Severe right worse than left facet arthrosis. Prior posterior decompression. Residual mild narrowing of the lateral recesses bilaterally. Central canal remains patent. Moderate right with mild left L4 foraminal stenosis. L5-S1: Mild disc bulge with disc desiccation. Moderate left worse than right facet arthrosis. Prior posterior decompression. No significant spinal stenosis. Moderate right  with mild left L5 foraminal narrowing. IMPRESSION: MR THORACIC SPINE IMPRESSION: No acute abnormality within the thoracic spine. No significant disc pathology or stenosis. No neural impingement. MR LUMBAR SPINE IMPRESSION: 1. No acute abnormality within the lumbar spine. 2. Postoperative changes from prior posterior decompression at L3-4 through L5-S1. Persistent moderate to severe canal with left worse than right lateral recess stenosis at the L3-4 level with moderate bilateral L3 foraminal narrowing. 3. Right eccentric disc osteophyte and facet arthrosis at L4-5 and L5-S1 with resultant moderate right L4 and L5 foraminal stenosis. 4. Left eccentric disc bulge and facet hypertrophy at L2-3 with resultant mild to moderate canal and left lateral recess stenosis. Electronically Signed   By: Jeannine Boga M.D.   On: 03/01/2021 00:11   MR LUMBAR SPINE WO CONTRAST  Result Date: 03/01/2021 CLINICAL DATA:  Initial evaluation for low back pain, cauda equina syndrome. EXAM: MRI  THORACIC AND LUMBAR SPINE WITHOUT CONTRAST TECHNIQUE: Multiplanar and multiecho pulse sequences of the thoracic and lumbar spine were obtained without intravenous contrast. COMPARISON:  None. FINDINGS: MRI THORACIC SPINE FINDINGS Alignment: Mild dextroscoliosis. Alignment otherwise normal with preservation of the normal thoracic kyphosis. No listhesis. Vertebrae: Vertebral body height maintained without acute or chronic fracture. Bone marrow signal intensity within normal limits. No discrete or worrisome osseous lesions or abnormal marrow edema. Cord:  Normal signal and morphology. Paraspinal and other soft tissues: Unremarkable. Disc levels: Normal for age multilevel disc desiccation seen throughout the thoracic spine. No significant disc bulge or focal disc herniation. No significant facet pathology. No canal or neural foraminal stenosis or evidence for neural impingement. MRI LUMBAR SPINE FINDINGS Segmentation:  Examination degraded by  motion artifact. Standard segmentation. Lowest well-formed disc space labeled the L5-S1 level. Alignment: Dextroscoliosis with straightening of the normal lumbar lordosis. Trace degenerative retrolisthesis of L3 on L4 through L5 on S1. Vertebrae: Vertebral body height maintained without acute or chronic fracture. Bone marrow signal intensity within normal limits. No worrisome osseous lesions. Discogenic reactive endplate change present about the L3-4 interspace. No other abnormal marrow edema. Conus medullaris and cauda equina: Conus extends to the L1 level. Conus and cauda equina appear normal. Paraspinal and other soft tissues: Chronic postoperative changes present within the posterior paraspinous soft tissues. No adverse features. Few scattered benign appearing cyst noted about the kidneys. Visualized visceral structures otherwise unremarkable. Disc levels: L1-2: Mild disc bulge with disc desiccation and reactive endplate spurring. Disc bulging asymmetric to the right. Mild facet and ligament flavum hypertrophy. No significant spinal stenosis. Foramina remain patent. L2-3: Mild intervertebral disc space narrowing with diffuse disc bulge and disc desiccation. Associated reactive endplate spurring. Changes asymmetric to the left. Mild to moderate bilateral facet hypertrophy. Resultant mild to moderate canal with left lateral recess stenosis. Mild left L2 foraminal narrowing. Right neural foramen remains patent. L3-4: Degenerative intervertebral disc space narrowing with disc desiccation and diffuse disc bulge. Associated discogenic reactive endplate change with marginal endplate osteophytic spurring. Moderate bilateral facet hypertrophy. Probable remote posterior decompression. Persistent moderate to severe canal with left worse than right lateral recess stenosis. Moderate bilateral L3 foraminal narrowing. L4-5: Mild disc bulge with disc desiccation, asymmetric to the right. Associated reactive endplate spurring,  also worse on the right. Severe right worse than left facet arthrosis. Prior posterior decompression. Residual mild narrowing of the lateral recesses bilaterally. Central canal remains patent. Moderate right with mild left L4 foraminal stenosis. L5-S1: Mild disc bulge with disc desiccation. Moderate left worse than right facet arthrosis. Prior posterior decompression. No significant spinal stenosis. Moderate right with mild left L5 foraminal narrowing. IMPRESSION: MR THORACIC SPINE IMPRESSION: No acute abnormality within the thoracic spine. No significant disc pathology or stenosis. No neural impingement. MR LUMBAR SPINE IMPRESSION: 1. No acute abnormality within the lumbar spine. 2. Postoperative changes from prior posterior decompression at L3-4 through L5-S1. Persistent moderate to severe canal with left worse than right lateral recess stenosis at the L3-4 level with moderate bilateral L3 foraminal narrowing. 3. Right eccentric disc osteophyte and facet arthrosis at L4-5 and L5-S1 with resultant moderate right L4 and L5 foraminal stenosis. 4. Left eccentric disc bulge and facet hypertrophy at L2-3 with resultant mild to moderate canal and left lateral recess stenosis. Electronically Signed   By: Jeannine Boga M.D.   On: 03/01/2021 00:11   DG Chest Port 1 View  Result Date: 02/28/2021 CLINICAL DATA:  Weakness EXAM: PORTABLE CHEST 1 VIEW COMPARISON:  01/20/2021 FINDINGS: Prior valve replacement and CABG. Insert COPD Heart and mediastinal contours are within normal limits. No focal opacities or effusions. No acute bony abnormality. IMPRESSION: COPD.  No active disease. Electronically Signed   By: Rolm Baptise M.D.   On: 02/28/2021 19:21   VAS [place] ABI WITH/WO TBI  Result Date: 03/01/2021  LOWER EXTREMITY DOPPLER STUDY Patient Name:  [name]  Date of Exam:   03/01/2021 Medical Rec #: [id number]        Accession #:    [id number] Date of Birth: 1940-08-03       Patient Gender: F Patient Age:   89 years  Exam Location:  [facility] Procedure:      VAS [place] ABI WITH/WO TBI Referring Phys: Nyoka Lint DOUTOVA --------------------------------------------------------------------------------  Indications: Peripheral artery disease, and leg numbness. High Risk Factors: Hypertension, hyperlipidemia, Diabetes, past history of                    smoking, coronary artery disease. Other Factors: CKD4, S/p CABG & AVR, PAD.  Vascular Interventions: RLE angioplasty and stent placement (04/29/2019) & LLE                         arteriogram without intervention (07/01/2019). Comparison Study: Previous exam 09/09/2020 RT ABI 0.65 LT 0.80 Performing Technologist: Hill, Jody RVT, RDMS  Examination Guidelines: A complete evaluation includes at minimum, Doppler waveform signals and systolic blood pressure reading at the level of bilateral brachial, anterior tibial, and posterior tibial arteries, when vessel segments are accessible. Bilateral testing is considered an integral part of a complete examination. Photoelectric Plethysmograph (PPG) waveforms and toe systolic pressure readings are included as required and additional duplex testing as needed. Limited examinations for reoccurring indications may be performed as noted.  ABI Findings: +---------+------------------+-----+----------+--------+  Right     Rt Pressure (mmHg) Index Waveform   Comment   +---------+------------------+-----+----------+--------+  Brachial  187                      triphasic            +---------+------------------+-----+----------+--------+  PTA       88                 0.47  monophasic           +---------+------------------+-----+----------+--------+  DP        78                 0.42  monophasic           +---------+------------------+-----+----------+--------+  Great Toe                          Absent               +---------+------------------+-----+----------+--------+ +---------+------------------+-----+----------+-------+  Left      Lt Pressure  (mmHg) Index Waveform   Comment  +---------+------------------+-----+----------+-------+  Brachial  183                      triphasic           +---------+------------------+-----+----------+-------+  PTA       44                 0.24  monophasic          +---------+------------------+-----+----------+-------+  DP        61  0.33  monophasic          +---------+------------------+-----+----------+-------+  Great Toe                          Absent              +---------+------------------+-----+----------+-------+ +-------+-----------+-----------+------------+------------+  ABI/TBI Today's ABI Today's TBI Previous ABI Previous TBI  +-------+-----------+-----------+------------+------------+  Right   0.47        Absent      0.65         0.24          +-------+-----------+-----------+------------+------------+  Left    0.33        Absent      0.80         0.43          +-------+-----------+-----------+------------+------------+ Bilateral ABIs significantly decreased since last examination in 08/2020 L > R Bilateral TBIs have become absent since last examination.  Summary: Right: Resting right ankle-brachial index indicates severe right lower extremity arterial disease. The right toe-brachial index is absent. Left: Resting left ankle-brachial index indicates severe left lower extremity arterial disease. The left toe-brachial index is absent.  *See table(s) above for measurements and observations.  Vascular consult recommended. Electronically signed by Harold Barban MD on 03/01/2021 at 9:00:20 PM.    Final    ECHOCARDIOGRAM COMPLETE  Result Date: 03/01/2021    ECHOCARDIOGRAM REPORT   Patient Name:   [name] Date of Exam: 03/01/2021 Medical Rec #:  [id number]       Height:       63.0 in Accession #:    [id number]      Weight:       147.0 lb Date of Birth:  April 20, 1940      BSA:          1.697 m Patient Age:    33 years        BP:           135/69 mmHg Patient Gender: F               HR:           96  bpm. Exam Location:  Inpatient Procedure: 2D Echo, Color Doppler and Cardiac Doppler Indications:    Elevated troponin  History:        Patient has prior history of Echocardiogram examinations. Risk                 Factors:Hypertension, Dyslipidemia and Former Smoker.                 Aortic Valve: 23 mm Edwards Inspiris Resilia valve is present in                 the aortic position.  Sonographer:    Jyl Heinz Referring Phys: [place]  1. Left ventricular ejection fraction, by estimation, is 55 to 60%. The left ventricle has normal function. The left ventricle has no regional wall motion abnormalities. Left ventricular diastolic parameters are consistent with Grade I diastolic dysfunction (impaired relaxation).  2. Right ventricular systolic function is normal. The right ventricular size is normal. Tricuspid regurgitation signal is inadequate for assessing PA pressure.  3. The mitral valve is grossly normal. Mild mitral valve regurgitation.  4. 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 Not well visualized, no significant paravalvular leak. AV max velocity 1.7 m/s. Aortic valve regurgitation is not visualized. There is a  23 mm Edwards Inspiris Resilia valve present in the aortic position.  5. The inferior vena cava is normal in size with greater than 50% respiratory variability, suggesting right atrial pressure of 3 mmHg. Comparison(s): No significant change from prior study. FINDINGS  Left Ventricle: Left ventricular ejection fraction, by estimation, is 55 to 60%. The left ventricle has normal function. The left ventricle has no regional wall motion abnormalities. The left ventricular internal cavity size was normal in size. There is  no left ventricular hypertrophy. Left ventricular diastolic parameters are consistent with Grade I diastolic dysfunction (impaired relaxation). Right Ventricle: The right ventricular size is normal. No increase in right ventricular wall thickness.  Right ventricular systolic function is normal. Tricuspid regurgitation signal is inadequate for assessing PA pressure. Left Atrium: Left atrial size was normal in size. Right Atrium: Right atrial size was normal in size. Pericardium: There is no evidence of pericardial effusion. Mitral Valve: The mitral valve is grossly normal. Mild mitral valve regurgitation. MV peak gradient, 13.8 mmHg. The mean mitral valve gradient is 7.0 mmHg. Tricuspid Valve: The tricuspid valve is normal in structure. Tricuspid valve regurgitation is not demonstrated. Aortic Valve: S/p 23 mm Edwards Inspiris Resilia Valve 05/04/2020. Not well visualized, no significant paravalvular leak. AV max velocity 1.7 m/s. Aortic valve regurgitation is not visualized. Aortic valve peak gradient measures 12.2 mmHg. There is a 23 mm Edwards Inspiris Resilia valve present in the aortic position. Pulmonic Valve: The pulmonic valve was not well visualized. Aorta: The aortic root is normal in size and structure. Venous: The inferior vena cava is normal in size with greater than 50% respiratory variability, suggesting right atrial pressure of 3 mmHg. IAS/Shunts: No atrial level shunt detected by color flow Doppler.  LEFT VENTRICLE PLAX 2D LVIDd:         4.00 cm     Diastology LVIDs:         2.80 cm     LV e' medial:    3.37 cm/s LV PW:         0.90 cm     LV E/e' medial:  38.3 LV IVS:        0.90 cm     LV e' lateral:   4.13 cm/s LVOT diam:     2.00 cm     LV E/e' lateral: 31.2 LV SV:         55 LV SV Index:   32 LVOT Area:     3.14 cm  LV Volumes (MOD) LV vol d, MOD A2C: 88.8 ml LV vol d, MOD A4C: 80.9 ml LV vol s, MOD A2C: 36.5 ml LV vol s, MOD A4C: 33.7 ml LV SV MOD A2C:     52.3 ml LV SV MOD A4C:     80.9 ml LV SV MOD BP:      48.7 ml RIGHT VENTRICLE            IVC RV Basal diam:  2.90 cm    IVC diam: 1.20 cm RV Mid diam:    2.30 cm RV S prime:     6.64 cm/s TAPSE (M-mode): 1.5 cm LEFT ATRIUM             Index        RIGHT ATRIUM           Index LA diam:         3.80 cm 2.24 cm/m   RA Area:     13.60 cm LA Vol (A2C):  54.4 ml 32.06 ml/m  RA Volume:   32.10 ml  18.92 ml/m LA Vol (A4C):   37.7 ml 22.22 ml/m LA Biplane Vol: 46.5 ml 27.41 ml/m  AORTIC VALVE AV Area (Vmax): 1.69 cm AV Vmax:        174.50 cm/s AV Peak Grad:   12.2 mmHg LVOT Vmax:      93.95 cm/s LVOT Vmean:     66.700 cm/s LVOT VTI:       0.174 m  AORTA Ao Root diam: 3.10 cm MITRAL VALVE MV Area (PHT): 5.38 cm     SHUNTS MV Area VTI:   1.79 cm     Systemic VTI:  0.17 m MV Peak grad:  13.8 mmHg    Systemic Diam: 2.00 cm MV Mean grad:  7.0 mmHg MV Vmax:       1.86 m/s MV Vmean:      128.0 cm/s MV Decel Time: 141 msec MR Peak grad: 149.8 mmHg MR Mean grad: 103.0 mmHg MR Vmax:      612.00 cm/s MR Vmean:     491.0 cm/s MV E velocity: 129.00 cm/s MV A velocity: 174.00 cm/s MV E/A ratio:  0.74 Mary Scientist, physiological signed by [name] Signature Date/Time: 03/01/2021/3:04:51 PM    Final    VAS [place] LOWER EXTREMITY VENOUS (DVT)  Result Date: 03/01/2021  Lower Venous DVT Study Patient Name:  [name]  Date of Exam:   03/01/2021 Medical Rec #: [id number]        Accession #:    [id number] Date of Birth: 1940-07-19       Patient Gender: F Patient Age:   42 years Exam Location[facility]  [facility] Procedure:      VAS [place] LOWER EXTREMITY VENOUS (DVT) Referring Phys: Nyoka Lint DOUTOVA --------------------------------------------------------------------------------  Other Indications: Edema per MD order. No edema present upon examination. Comparison Study: No previous LEV exams Performing Technologist: Jody Hill RVT, RDMS  Examination Guidelines: A complete evaluation includes B-mode imaging, spectral Doppler, color Doppler, and power Doppler as needed of all accessible portions of each vessel. Bilateral testing is considered an integral part of a complete examination. Limited examinations for reoccurring indications may be performed as noted. The reflux portion of the exam is performed with the patient  in reverse Trendelenburg.  +---------+---------------+---------+-----------+----------+--------------+  RIGHT     Compressibility Phasicity Spontaneity Properties Thrombus Aging  +---------+---------------+---------+-----------+----------+--------------+  CFV       Full            Yes       Yes                                    +---------+---------------+---------+-----------+----------+--------------+  SFJ       Full                                                             +---------+---------------+---------+-----------+----------+--------------+  FV Prox   Full            Yes       Yes                                    +---------+---------------+---------+-----------+----------+--------------+  FV Mid    Full            Yes       Yes                                    +---------+---------------+---------+-----------+----------+--------------+  FV Distal Full            Yes       Yes                                    +---------+---------------+---------+-----------+----------+--------------+  PFV       Full                                                             +---------+---------------+---------+-----------+----------+--------------+  POP       Full            Yes       Yes                                    +---------+---------------+---------+-----------+----------+--------------+  PTV       Full                                                             +---------+---------------+---------+-----------+----------+--------------+  PERO      Full                                                             +---------+---------------+---------+-----------+----------+--------------+   +---------+---------------+---------+-----------+----------+--------------+  LEFT      Compressibility Phasicity Spontaneity Properties Thrombus Aging  +---------+---------------+---------+-----------+----------+--------------+  CFV       Full            Yes       Yes                                     +---------+---------------+---------+-----------+----------+--------------+  SFJ       Full                                                             +---------+---------------+---------+-----------+----------+--------------+  FV Prox   Full            Yes       Yes                                    +---------+---------------+---------+-----------+----------+--------------+  FV Mid    Full            Yes       Yes                                    +---------+---------------+---------+-----------+----------+--------------+  FV Distal Full            Yes       Yes                                    +---------+---------------+---------+-----------+----------+--------------+  PFV       Full                                                             +---------+---------------+---------+-----------+----------+--------------+  POP       Full            Yes       Yes                                    +---------+---------------+---------+-----------+----------+--------------+  PTV       Full                                                             +---------+---------------+---------+-----------+----------+--------------+  PERO      Full                                                             +---------+---------------+---------+-----------+----------+--------------+     Summary: BILATERAL: - No evidence of deep vein thrombosis seen in the lower extremities, bilaterally. - No evidence of superficial venous thrombosis in the lower extremities, bilaterally. -No evidence of popliteal cyst, bilaterally.   *See table(s) above for measurements and observations. Electronically signed by Harold Barban MD on 03/01/2021 at 9:00:56 PM.    Final     Medications:    aspirin EC  81 mg Oral q AM   bacitracin   Topical BID   insulin aspart  0-15 Units Subcutaneous TID WC   insulin aspart  0-5 Units Subcutaneous QHS   insulin glargine-yfgn  10 Units Subcutaneous QHS   levothyroxine  100 mcg Oral Q0600   metoprolol  tartrate  12.5 mg Oral BID   potassium chloride  40 mEq Oral Once   sodium chloride flush  3 mL Intravenous Q12H   Continuous Infusions:  sodium chloride     heparin 1,150 Units/hr (03/01/21 2234)     LOS: 1 day   Geradine Girt  [facility]   How to contact the [facility] Attending or Consulting provider [place] or covering provider during after hours [place], for this patient?  Check  the care team in [facility] and look for a) attending/consulting [place] provider listed and b) the [facility] team listed Log into [email or web address] and use [place]'s universal password to access. If you do not have the password, please contact the hospital operator. Locate the [facility] provider you are looking for under [facility] and page to a number that you can be directly reached. If you still have difficulty reaching the provider, please page the [facility] (Director on Call) for the Hospitalists listed on amion for assistance.  03/02/2021, 1:22 PM

## 2021-03-02 NOTE — Discharge Instructions (Signed)

## 2021-03-02 NOTE — Consult Note (Signed)
Hospital Consult    Reason for Consult:  Left toe wound, bilateral heel wounds Referring Physician:  Hospitalist MRN #:  [id number]  History of Present Illness: This is a 81 y.o. female with hx DM, HLD, HTN, CAD s/p CABG, known PAD, CKD stage 4 that vascular surgery was consulted for left great toe wound and bilateral heel wounds.  She presented to the ED with clumsiness, fatigue, diplopia and states this started after she started Neurontin for her neuropathy.  She has a nonhealing wound to the left great toe following removal of an ingrown toenail about 5 weeks ago by podiatry in [facility].  She has previously been followed for claudication and states that claudication symptoms are stable.  Denies any rest pain in her feet at night other than her baseline neuropathy. She previously underwent a right SFA angioplasty with stent placement using a 6 x 120 Eluvia and a 6 x 80 Eluvia by myself on 04/29/2019.  On 07/01/2019 we evaluated the left leg and at the time we elected no intervention given she had single-vessel peroneal runoff that was heavily diseased with only claudication symptoms.  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, inv mammary, in situ,ER/PR +,HER2 -   Cancer ([place])    Carcinoma of breast treated with adjuvant chemotherapy ([place])    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[place])    Claudication ([place])    Coronary artery disease    stent 2007   Depression    Diabetes mellitus    Difficulty sleeping    Diverticulosis 12/10/03   Elevated cholesterol    Generalized weakness    GERD (gastroesophageal reflux disease)    Heart murmur    Hepatitis    as an infant   HSV-1 (herpes simplex virus 1) infection     Hyperlipidemia    Hypertension    Hypothyroidism    Osteoarthritis    PAD (peripheral artery disease) ([place])    Personal history of chemotherapy    Personal history of radiation therapy    Pneumonia    2014 september and March 2022   Rheumatoid arthritis ([place])    S/P aortic valve replacement with bioprosthetic valve 05/04/2020   Edwards Inspiris Resilia stented bovine pericardial tissue valve, size 23 mm   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   LIMA to D3, SVG to PDA, EVH via right thigh   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   Dr [name]   Spinal stenosis    Thyroid disease     Past Surgical History:  Procedure Laterality Date   ABDOMINAL AORTOGRAM W/LOWER EXTREMITY Bilateral 04/29/2019   Procedure: ABDOMINAL AORTOGRAM W/LOWER EXTREMITY;  Surgeon: Marty Heck, MD;  Location: [place];  Service: Cardiovascular;  Laterality: Bilateral;   ABDOMINAL AORTOGRAM W/LOWER EXTREMITY Left 07/01/2019   Procedure: ABDOMINAL AORTOGRAM W/ Left LOWER EXTREMITY [place];  Surgeon: Marty Heck, MD;  Location: [place] [facility];  Service: Cardiovascular;  Laterality: Left;   ANTERIOR AND POSTERIOR REPAIR N/A 04/05/2016   Procedure: ANTERIOR (CYSTOCELE) AND POSTERIOR REPAIR (RECTOCELE);  Surgeon: [name], MD;  Location: [facility];  Service: Gynecology;  Laterality: N/A;   AORTIC VALVE REPLACEMENT N/A 05/04/2020   Procedure: AORTIC VALVE REPLACEMENT (AVR) USING INSPIRIS 23MM RESILIA AORTIC VALVE;  Surgeon: Rexene Alberts, MD;  Location: [facility] [facility];  Service: Open Heart Surgery;  Laterality: N/A;   BREAST BIOPSY     BREAST LUMPECTOMY  1983   benign biopsy   BREAST LUMPECTOMY Right 12/29/2010   snbx, ER/PR +, Her2 -, 0/1 node pos.   CARPAL TUNNEL RELEASE Bilateral 9/12,8,12   CHOLECYSTECTOMY     COLONOSCOPY N/A 02/09/2013   Procedure: COLONOSCOPY;  Surgeon: [name], MD;  [facility]: [facility];  Service: Endoscopy;  Laterality: N/A;   COLONOSCOPY     CORONARY ANGIOPLASTY     CORONARY  ARTERY BYPASS GRAFT N/A 05/04/2020   Procedure: CORONARY ARTERY BYPASS GRAFTING (CABG), ON PUMP, TIMES TWO, USING LEFT INTERNAL MAMMARY ARTERY AND RIGHT ENDOSCOPICALLY HARVESTED GREATER SAPHENOUS VEIN;  Surgeon: Rexene Alberts, MD;  Location: [place];  Service: Open Heart Surgery;  Laterality: N/A;   DILATION AND CURETTAGE OF UTERUS     ESOPHAGOGASTRODUODENOSCOPY N/A 02/09/2013   Procedure: ESOPHAGOGASTRODUODENOSCOPY (EGD);  Surgeon: [name], MD;  Location: [name];  Service: Endoscopy;  Laterality: N/A;   FOOT SPURS     HYSTEROSCOPY WITH D & C N/A 09/11/2012   Procedure: DILATATION AND CURETTAGE ;  Surgeon: [name], MD;  Location: [facility];  Service: Gynecology;  Laterality: N/A;   KNEE SURGERY  2007   LAPAROSCOPIC ASSISTED VAGINAL HYSTERECTOMY N/A 04/05/2016   Procedure: LAPAROSCOPIC ASSISTED VAGINAL HYSTERECTOMY possible BSO;  Surgeon: [name], MD;  Location: [facility];  Service: Gynecology;  Laterality: N/A;   LUMBAR LAMINECTOMY/DECOMPRESSION MICRODISCECTOMY N/A 10/20/2014   Procedure: MICRO LUMBER DECOMPRESSION L3-4 L4-5;  Surgeon: [name], MD;  [facility];  Service: Orthopedics;  Laterality: N/A;   LUMBAR LAMINECTOMY/DECOMPRESSION MICRODISCECTOMY Bilateral 10/13/2015   Procedure: MICRO LUMBAR DECOMPRESSION L5 - S1 AND REDO DECOMPRESSION L4 - L5 AND REMOVAL OF FACET CYST L4 - L5 2 LEVELS;  Surgeon: [name], MD;  [facility];  Service: Orthopedics;  Laterality: Bilateral;   PERIPHERAL VASCULAR INTERVENTION Right 04/29/2019   Procedure: PERIPHERAL VASCULAR INTERVENTION;  Surgeon: Marty Heck, MD;  Location: [facility];  Service: Cardiovascular;  Laterality: Right;   PORT-A-CATH REMOVAL  04/17/2011   Procedure: REMOVAL PORT-A-CATH;  Surgeon: [name], MD;  Location: [facility];  Service: General;  Laterality: Left;   PORTACATH PLACEMENT  02/07/2011   Procedure: INSERTION PORT-A-CATH;  Surgeon: [name], MD;   [facility];  Service: General;  Laterality: N/A;   RIGHT/LEFT HEART CATH AND CORONARY ANGIOGRAPHY N/A 03/17/2020   Procedure: RIGHT/LEFT HEART CATH AND CORONARY ANGIOGRAPHY;  Surgeon: Burnell Blanks, MD;  Location: [facility];  Service: Cardiovascular;  Laterality: N/A;   TEE WITHOUT CARDIOVERSION N/A 05/04/2020   Procedure: TRANSESOPHAGEAL ECHOCARDIOGRAM (TEE);  Surgeon: Rexene Alberts, MD;  Location: [facility];  Service: Open Heart Surgery;  Laterality: N/A;   UPPER GASTROINTESTINAL ENDOSCOPY      Allergies  Allergen Reactions   Codeine Nausea And Vomiting and Other (See Comments)    Severe stomach cramps, also   Antihistamines, Diphenhydramine-Type Other (See Comments)    Causes hyperactivity   Gabapentin     Urinary incontinence   Statins Other (See Comments)    Joint pains   Sulfa Antibiotics Other (See Comments)    Reaction not recalled   Erythromycin Hives, Rash and Other (See Comments)  Allergic due to dental work from 50 years ago. It was used in packing and resulted in rash/hives inside and outside of mouth.   Trulicity [Dulaglutide] Itching    Prior to Admission medications   Medication Sig Start Date End Date Taking? Authorizing Provider  Accu-Chek FastClix Lancets MISC USE TO CHECK BLOOD SUGARS 3 TIMES DAILY 11/03/19  Yes Cirigliano, Mary K, DO  acetaminophen (TYLENOL) 500 MG tablet Take 500 mg by mouth every 6 (six) hours as needed for mild pain or headache.   Yes [provider]  aspirin EC 81 MG tablet Take 81 mg by mouth in the morning.   Yes [provider]  blood glucose meter kit and supplies Dispense based on patient and insurance preference. Use up to four times daily as directed. (FOR ICD-10 E10.9, E11.9). 11/02/20  Yes [name], MD  Blood Glucose Monitoring Suppl DEVI by Does not apply route. Accu Check Guide   Yes [provider]  calcium carbonate (TUMS - DOSED IN MG ELEMENTAL CALCIUM) 500 MG chewable  tablet Chew 3 tablets by mouth at bedtime.   Yes [provider]  Cholecalciferol (VITAMIN D3) 25 MCG (1000 UT) CAPS Take 2,000 Units by mouth daily before lunch.   Yes [provider]  Evolocumab (REPATHA SURECLICK) 947 MG/ML SOAJ Inject 1 pen into the skin every 14 (fourteen) days. Patient taking differently: Inject 140 mg into the skin every 14 (fourteen) days. 11/18/20  Yes Josue Hector, MD  glipiZIDE (GLUCOTROL XL) 10 MG 24 hr tablet Take 1 tablet (10 mg total) by mouth daily with breakfast. 12/19/20  Yes [name], MD  glucose blood (ACCU-CHEK GUIDE) test strip TEST ONCE DAILY 11/04/20  Yes [name], MD  glucose blood test strip 1 each by Other route as needed for other. Use as instructed  Please dispense Accu check Guide Test strips   Yes [provider]  Golimumab ([place] ARIA IV) Inject into the vein every 2 (two) months.   Yes [provider]  icosapent Ethyl (VASCEPA) 1 g capsule Take 2 capsules (2 g total) by mouth 2 (two) times daily. 11/18/20  Yes Josue Hector, MD  insulin glargine (LANTUS) 100 UNIT/ML Solostar Pen Inject 10 Units into the skin daily. 02/22/21  Yes RuddLillette Boxer, MD  JARDIANCE 10 MG TABS tablet TAKE 1 TABLET(10 MG) BY MOUTH DAILY BEFORE BREAKFAST Patient taking differently: Take 10 mg by mouth daily. 02/06/21  Yes [name], MD  levothyroxine (SYNTHROID) 100 MCG tablet Take 100 mcg by mouth daily before breakfast.   Yes [provider]  losartan (COZAAR) 50 MG tablet Take 50 mg by mouth daily.   Yes [provider]  metoprolol tartrate (LOPRESSOR) 25 MG tablet TAKE 1 TABLET(25 MG) BY MOUTH TWICE DAILY Patient taking differently: Take 25 mg by mouth 2 (two) times daily. 07/26/20  Yes Josue Hector, MD  pantoprazole (PROTONIX) 40 MG tablet Take 1 tablet (40 mg total) by mouth daily before breakfast. 02/07/21  Yes Aline August, MD  PARoxetine (PAXIL) 20 MG tablet Take 1 tablet (20 mg total)  by mouth in the morning. 01/25/21  Yes [name], MD  pregabalin (LYRICA) 75 MG capsule Take 1 capsule (75 mg total) by mouth 2 (two) times daily. 02/24/21  Yes Josue Hector, MD  Semaglutide,0.25 or 0.5MG/DOS, (OZEMPIC, 0.25 OR 0.5 MG/DOSE,) 2 MG/1.5ML SOPN Inject 0.5 mg into the skin once a week. Patient taking differently: Inject 0.5 mg  into the skin every Monday. 02/02/21  Yes [name], MD  traZODone (DESYREL) 50 MG tablet Take 0.5 tablets (25 mg total) by mouth at bedtime as needed for sleep. 11/02/20  Yes [name], MD  valACYclovir (VALTREX) 1000 MG tablet Take 1 tablet (1,000 mg total) by mouth daily as needed (Fever Blisters). 05/20/20  Yes Cirigliano, Mary K, DO  colchicine 0.6 MG tablet Take 1 tablet (0.6 mg total) by mouth 2 (two) times daily. Patient not taking: Reported on 03/01/2021 02/24/21   Josue Hector, MD  fluconazole (DIFLUCAN) 150 MG tablet Take 150 mg by mouth once. Patient not taking: Reported on 03/01/2021 02/24/21   [provider]  Magnesium 250 MG TABS Take 250 mg by mouth at bedtime.    [provider]  nystatin (MYCOSTATIN) 100000 UNIT/ML suspension Take 5 mLs (500,000 Units total) by mouth 4 (four) times daily. Swish and swallow Patient not taking: Reported on 03/01/2021 02/22/21   [name], MD    Social History   Socioeconomic History   Marital status: Married    Spouse name: Not on file   Number of children: 3   Years of education: Not on file   Highest education level: Not on file  Occupational History   Occupation: retired-bookkeeping  Tobacco Use   Smoking status: Former    Packs/day: 2.00    Years: 40.00    Pack years: 80.00    Types: Cigarettes    Quit date: 12/19/1997    Years since quitting: 23.2   Smokeless tobacco: Never  Vaping Use   Vaping Use: Never used  Substance and Sexual Activity   Alcohol use: Yes    Alcohol/week: 1.0 standard drink    Types: 1 Glasses of wine per week    Comment: occasiona -  maybe once a month   Drug use: No   Sexual activity: Not Currently    Comment: menarch age 26, P26, HRT X 10 YRS, MENOPAUSE MID 40'S  Other Topics Concern   Not on file  Social History Narrative   One daughter died from suicide at age 73.   Social Determinants of Health   Financial Resource Strain: Low Risk    Difficulty of Paying Living Expenses: Not hard at all  Food Insecurity: No Food Insecurity   Worried About Charity fundraiser in the Last Year: Never true   [place] in the Last Year: Never true  Transportation Needs: No Transportation Needs   Lack of Transportation (Medical): No   Lack of Transportation (Non-Medical): No  Physical Activity: Insufficiently Active   Days of Exercise per Week: 2 days   Minutes of Exercise per Session: 20 min  Stress: No Stress Concern Present   Feeling of Stress : Only a little  Social Connections: Moderately Integrated   Frequency of Communication with Friends and Family: Three times a week   Frequency of Social Gatherings with Friends and Family: Twice a week   Attends Religious Services: More than 4 times per year   Active Member of [employer] or Organizations: No   Attends Archivist Meetings: Never   Marital Status: Married  Human resources officer Violence: Not At Risk   Fear of Current or Ex-Partner: No   Emotionally Abused: No   Physically Abused: No   Sexually Abused: No     Family History  Problem Relation Age of Onset   Kidney disease Mother    Heart disease Mother  Throat cancer Father    Alcoholism Father    Heart attack Father    Lymphoma Brother 81   Heart attack Brother    Diabetes Brother    Hypertension Brother    Cancer Son    Colon cancer Neg Hx    Stroke Neg Hx    Esophageal cancer Neg Hx    Rectal cancer Neg Hx    Stomach cancer Neg Hx     ROS: '[x]'  Positive   '[ ]'  Negative   '[ ]'  All sytems reviewed and are negative  Cardiovascular: '[]'  chest pain/pressure '[]'  palpitations '[]'  SOB lying  flat '[]'  DOE '[]'  pain in legs while walking '[]'  pain in legs at rest '[]'  pain in legs at night '[]'  non-healing ulcers '[]'  hx of DVT '[]'  swelling in legs  Pulmonary: '[]'  productive cough '[]'  asthma/wheezing '[]'  home O2  Neurologic: '[]'  weakness in '[]'  arms '[]'  legs '[]'  numbness in '[]'  arms '[]'  legs '[]'  hx of CVA '[]'  mini stroke '[]' difficulty speaking or slurred speech '[]'  temporary loss of vision in one eye '[]'  dizziness  Hematologic: '[]'  hx of cancer '[]'  bleeding problems '[]'  problems with blood clotting easily  Endocrine:   '[]'  diabetes '[]'  thyroid disease  GI '[]'  vomiting blood '[]'  blood in stool  GU: '[]'  CKD/renal failure '[]'  HD--'[]'  M/W/F or '[]'  T/T/S '[]'  burning with urination '[]'  blood in urine  Psychiatric: '[]'  anxiety '[]'  depression  Musculoskeletal: '[]'  arthritis '[]'  joint pain  Integumentary: '[]'  rashes '[]'  ulcers  Constitutional: '[]'  fever '[]'  chills   Physical Examination  Vitals:   03/02/21 0633 03/02/21 0831  BP: 136/76 (!) 143/62  Pulse: 98 96  Resp: 18 14  Temp: (!) 97.4 F (36.3 C) 98 F (36.7 C)  SpO2: 97% 93%   Body mass index is 26.05 kg/m.  General:  NAD Gait: Not observed HENT: WNL, normocephalic Pulmonary: normal non-labored breathing Cardiac: regular, without  Murmurs, rubs or gallops Abdomen:  soft, NT/ND Vascular Exam/Pulses: Bilateral femoral pulses palpable No palpable pedal pulses Left great toe wound nonhealing as pictured below with some cracks on the left heel Extremities: without ischemic changes Musculoskeletal: no muscle wasting or atrophy  Neurologic: A&O X 3; Appropriate Affect ; SENSATION: normal; MOTOR FUNCTION:  moving all extremities equally. Speech is fluent/normal        CBC    Component Value Date/Time   WBC 8.1 03/02/2021 0640   RBC 4.18 03/02/2021 0640   HGB 12.7 03/02/2021 0640   HGB 12.7 08/24/2020 1005   HGB 12.0 02/21/2016 1035   HCT 37.5 03/02/2021 0640   HCT 38.8 08/24/2020 1005   HCT 36.4 02/21/2016 1035    PLT 174 03/02/2021 0640   PLT 226 08/24/2020 1005   MCV 89.7 03/02/2021 0640   MCV 88 08/24/2020 1005   MCV 87.4 02/21/2016 1035   MCH 30.4 03/02/2021 0640   MCHC 33.9 03/02/2021 0640   RDW 15.1 03/02/2021 0640   RDW 13.3 08/24/2020 1005   RDW 14.2 02/21/2016 1035   LYMPHSABS 3.1 03/01/2021 0423   LYMPHSABS 2.6 08/24/2020 1005   LYMPHSABS 2.6 02/21/2016 1035   MONOABS 1.2 (H) 03/01/2021 0423   MONOABS 0.7 02/21/2016 1035   EOSABS 0.2 03/01/2021 0423   EOSABS 0.5 (H) 08/24/2020 1005   BASOSABS 0.0 03/01/2021 0423   BASOSABS 0.1 08/24/2020 1005   BASOSABS 0.1 02/21/2016 1035    BMET    Component Value Date/Time   NA 135 03/02/2021 0042   NA 138 10/26/2020 1137  NA 138 02/21/2016 1035   K 4.0 03/02/2021 0042   K 4.5 02/21/2016 1035   CL 101 03/02/2021 0042   CL 106 04/10/2012 0921   CO2 21 (L) 03/02/2021 0042   CO2 24 02/21/2016 1035   GLUCOSE 185 (H) 03/02/2021 0042   GLUCOSE 194 (H) 02/21/2016 1035   GLUCOSE 216 (H) 04/10/2012 0921   BUN 28 (H) 03/02/2021 0042   BUN 32 (H) 10/26/2020 1137   BUN 18.7 02/21/2016 1035   CREATININE 1.58 (H) 03/02/2021 0042   CREATININE 1.4 (H) 02/21/2016 1035   CALCIUM 8.7 (L) 03/02/2021 0042   CALCIUM 9.0 02/21/2016 1035   GFRNONAA 33 (L) 03/02/2021 0042   GFRAA 32 (L) 03/11/2020 0913    COAGS: Lab Results  Component Value Date   INR 1.4 (H) 05/04/2020   INR 1.0 05/02/2020   INR 0.88 09/10/2015     Non-Invasive Vascular Imaging:     ABIs were 0.47 on the right monophasic and 0.33 on the left monophasic.  Absent toe pressure bilaterally.   ASSESSMENT/PLAN: This is a 81 y.o. female with multiple medical comorbidities that vascular surgery has been consulted for a nonhealing left great toe wound and bilateral heel wounds.  I think the focus should be on her left great toe wound that is nonhealing after an ingrown toenail was removed. This started about 5 weeks ago following removal of the ingrown toenail by podiatry at [facility] according to the patient.  Her ABIs have dropped on the left now 0.33 monophasic with absent toe pressure.  I do not think she has adequate inflow for healing.  She has previously undergone left lower extremity arteriogram with no intervention for claudication given known single-vessel peroneal runoff that was diseased.  I think she now merits repeat aortogram, lower extremity arteriogram, and possible intervention to optimize her chances of wound healing.  I will post her tomorrow with Dr. Stanford Breed in the cath lab.  Please keep n.p.o. after midnight.  Risk benefits discussed.  Marty Heck, MD [facility] Office: [place]

## 2021-03-02 NOTE — Progress Notes (Addendum)
Progress Note  Patient Name: [name] Date of Encounter: 03/02/2021  [facility] Cardiologist: [name], MD   Subjective   Patient denies any chest pain, palpitations, SOB, dizziness. Leg Weakness has improved since she was admitted.   She denies any symptoms related to her previous episodes of ischemia ( dyspnea)   Inpatient Medications    Scheduled Meds:  aspirin EC  81 mg Oral q AM   bacitracin   Topical BID   insulin aspart  0-15 Units Subcutaneous TID WC   insulin aspart  0-5 Units Subcutaneous QHS   insulin glargine-yfgn  10 Units Subcutaneous QHS   levothyroxine  100 mcg Oral Q0600   metoprolol tartrate  12.5 mg Oral BID   potassium chloride  40 mEq Oral Once   sodium chloride flush  3 mL Intravenous Q12H   Continuous Infusions:  sodium chloride     heparin 1,150 Units/hr (03/01/21 2234)   PRN Meds: sodium chloride, acetaminophen **OR** acetaminophen, HYDROcodone-acetaminophen, sodium chloride flush   Vital Signs    Vitals:   03/02/21 0235 03/02/21 0633 03/02/21 0633 03/02/21 0831  BP: (!) 161/80 136/76 136/76 (!) 143/62  Pulse: (!) 107 96 98 96  Resp: 17 18 18 14   Temp: 98 F (36.7 C) (!) 97.4 F (36.3 C) (!) 97.4 F (36.3 C) 98 F (36.7 C)  TempSrc:  Oral Oral Oral  SpO2: 95% 97% 97% 93%  Weight:        Intake/Output Summary (Last 24 hours) at 03/02/2021 0953 Last data filed at 03/02/2021 0900 Gross per 24 hour  Intake 460 ml  Output --  Net 460 ml   Last 3 Weights 02/28/2021 02/24/2021 02/22/2021  Weight (lbs) 147 lb 0.8 oz 147 lb 146 lb  Weight (kg) 66.7 kg 66.679 kg 66.225 kg      Telemetry    NSR - Personally Reviewed  ECG    No new tracings - Personally Reviewed  Physical Exam   GEN: No acute distress. Walking with PT.  Neck: No JVD Cardiac: Regular rhythm, tachycardic.  no murmurs, rubs, or gallops.  Respiratory: Clear to auscultation bilaterally. GI: Soft, nontender, non-distended  MS: No edema; No deformity. Neuro:   Nonfocal  Psych: Normal affect   Labs    High Sensitivity Troponin:   Recent Labs  Lab 02/28/21 1128 02/28/21 1806 03/01/21 0128  TROPONINIHS 188* 309* 127*     Chemistry Recent Labs  Lab 03/01/21 0128 03/01/21 0423 03/02/21 0042  NA 135 136 135  K 3.5 3.4* 4.0  CL 102 101 101  CO2 21* 24 21*  GLUCOSE 275* 183* 185*  BUN 32* 30* 28*  CREATININE 1.76* 1.70* 1.58*  CALCIUM 9.1 9.1 8.7*  MG 1.7 1.8  --   PROT 5.6* 5.8*  --   ALBUMIN 2.7* 2.8*  --   AST 18 19  --   ALT 18 18  --   ALKPHOS 73 66  --   BILITOT 0.7 0.5  --   GFRNONAA 29* 31* 33*  ANIONGAP 12 11 13     Lipids No results for input(s): CHOL, TRIG, HDL, LABVLDL, LDLCALC, CHOLHDL in the last 168 hours.  Hematology Recent Labs  Lab 03/01/21 0128 03/01/21 0423 03/02/21 0640  WBC 10.8* 9.2 8.1  RBC 3.51* 3.87 4.18  HGB 10.7* 11.6* 12.7  HCT 32.6* 35.8* 37.5  MCV 92.9 92.5 89.7  MCH 30.5 30.0 30.4  MCHC 32.8 32.4 33.9  RDW 15.2 15.2 15.1  PLT 154 139*  174   Thyroid  Recent Labs  Lab 03/01/21 0423  TSH 5.599*    BNP Recent Labs  Lab 03/01/21 0128  BNP 230.8*    DDimer  Recent Labs  Lab 03/01/21 0128  DDIMER 1.48*     Radiology    CT Head Wo Contrast  Result Date: 02/28/2021 CLINICAL DATA:  Head trauma.  Weakness. EXAM: CT HEAD WITHOUT CONTRAST TECHNIQUE: Contiguous axial images were obtained from the base of the skull through the vertex without intravenous contrast. RADIATION DOSE REDUCTION: This exam was performed according to the departmental dose-optimization program which includes automated exposure control, adjustment of the mA and/or kV according to patient size and/or use of iterative reconstruction technique. COMPARISON:  Head MRI 11/05/2017 FINDINGS: Brain: There is no evidence of an acute infarct, intracranial hemorrhage, mass, midline shift, or extra-axial fluid collection. The ventricles and sulci are within normal limits for age. Hypodensities in the cerebral white matter  nonspecific but compatible with mild chronic small vessel ischemic disease. Vascular: Calcified atherosclerosis at the skull base. No hyperdense vessel. Skull: No acute fracture or suspicious osseous lesion. Sinuses/Orbits: Visualized paranasal sinuses and mastoid air cells are clear. Visualized orbits are unremarkable. Other: None. IMPRESSION: 1. No evidence of acute intracranial abnormality. 2. Mild chronic small vessel ischemic disease. Electronically Signed   By: Logan Bores M.D.   On: 02/28/2021 11:58   MR BRAIN WO CONTRAST  Result Date: 03/01/2021 CLINICAL DATA:  Diplopia EXAM: MRI HEAD WITHOUT CONTRAST TECHNIQUE: Multiplanar, multiecho pulse sequences of the brain and surrounding structures were obtained without intravenous contrast. COMPARISON:  2019 FINDINGS: Brain: There is no acute infarction or intracranial hemorrhage. There is no intracranial mass, mass effect, or edema. There is no hydrocephalus or extra-axial fluid collection. Prominence of ventricles and sulci reflects parenchymal volume loss. Patchy T2 hyperintensity in the supratentorial white matter is nonspecific but may reflect mild chronic microvascular ischemic changes. Vascular: Major vessel flow voids at the skull base are preserved. Skull and upper cervical spine: Normal marrow signal is preserved. Sinuses/Orbits: Paranasal sinuses are aerated. Orbits are unremarkable. Other: Sella is unremarkable.  Mastoid air cells are clear. IMPRESSION: No evidence of recent infarction, hemorrhage, or mass. Mild chronic microvascular ischemic changes. Electronically Signed   By: Macy Mis M.D.   On: 03/01/2021 16:15   MR THORACIC SPINE WO CONTRAST  Result Date: 03/01/2021 CLINICAL DATA:  Initial evaluation for low back pain, cauda equina syndrome. EXAM: MRI THORACIC AND LUMBAR SPINE WITHOUT CONTRAST TECHNIQUE: Multiplanar and multiecho pulse sequences of the thoracic and lumbar spine were obtained without intravenous contrast. COMPARISON:   None. FINDINGS: MRI THORACIC SPINE FINDINGS Alignment: Mild dextroscoliosis. Alignment otherwise normal with preservation of the normal thoracic kyphosis. No listhesis. Vertebrae: Vertebral body height maintained without acute or chronic fracture. Bone marrow signal intensity within normal limits. No discrete or worrisome osseous lesions or abnormal marrow edema. Cord:  Normal signal and morphology. Paraspinal and other soft tissues: Unremarkable. Disc levels: Normal for age multilevel disc desiccation seen throughout the thoracic spine. No significant disc bulge or focal disc herniation. No significant facet pathology. No canal or neural foraminal stenosis or evidence for neural impingement. MRI LUMBAR SPINE FINDINGS Segmentation:  Examination degraded by motion artifact. Standard segmentation. Lowest well-formed disc space labeled the L5-S1 level. Alignment: Dextroscoliosis with straightening of the normal lumbar lordosis. Trace degenerative retrolisthesis of L3 on L4 through L5 on S1. Vertebrae: Vertebral body height maintained without acute or chronic fracture. Bone marrow signal intensity within normal limits. No  worrisome osseous lesions. Discogenic reactive endplate change present about the L3-4 interspace. No other abnormal marrow edema. Conus medullaris and cauda equina: Conus extends to the L1 level. Conus and cauda equina appear normal. Paraspinal and other soft tissues: Chronic postoperative changes present within the posterior paraspinous soft tissues. No adverse features. Few scattered benign appearing cyst noted about the kidneys. Visualized visceral structures otherwise unremarkable. Disc levels: L1-2: Mild disc bulge with disc desiccation and reactive endplate spurring. Disc bulging asymmetric to the right. Mild facet and ligament flavum hypertrophy. No significant spinal stenosis. Foramina remain patent. L2-3: Mild intervertebral disc space narrowing with diffuse disc bulge and disc desiccation.  Associated reactive endplate spurring. Changes asymmetric to the left. Mild to moderate bilateral facet hypertrophy. Resultant mild to moderate canal with left lateral recess stenosis. Mild left L2 foraminal narrowing. Right neural foramen remains patent. L3-4: Degenerative intervertebral disc space narrowing with disc desiccation and diffuse disc bulge. Associated discogenic reactive endplate change with marginal endplate osteophytic spurring. Moderate bilateral facet hypertrophy. Probable remote posterior decompression. Persistent moderate to severe canal with left worse than right lateral recess stenosis. Moderate bilateral L3 foraminal narrowing. L4-5: Mild disc bulge with disc desiccation, asymmetric to the right. Associated reactive endplate spurring, also worse on the right. Severe right worse than left facet arthrosis. Prior posterior decompression. Residual mild narrowing of the lateral recesses bilaterally. Central canal remains patent. Moderate right with mild left L4 foraminal stenosis. L5-S1: Mild disc bulge with disc desiccation. Moderate left worse than right facet arthrosis. Prior posterior decompression. No significant spinal stenosis. Moderate right with mild left L5 foraminal narrowing. IMPRESSION: MR THORACIC SPINE IMPRESSION: No acute abnormality within the thoracic spine. No significant disc pathology or stenosis. No neural impingement. MR LUMBAR SPINE IMPRESSION: 1. No acute abnormality within the lumbar spine. 2. Postoperative changes from prior posterior decompression at L3-4 through L5-S1. Persistent moderate to severe canal with left worse than right lateral recess stenosis at the L3-4 level with moderate bilateral L3 foraminal narrowing. 3. Right eccentric disc osteophyte and facet arthrosis at L4-5 and L5-S1 with resultant moderate right L4 and L5 foraminal stenosis. 4. Left eccentric disc bulge and facet hypertrophy at L2-3 with resultant mild to moderate canal and left lateral recess  stenosis. Electronically Signed   By: Jeannine Boga M.D.   On: 03/01/2021 00:11   MR LUMBAR SPINE WO CONTRAST  Result Date: 03/01/2021 CLINICAL DATA:  Initial evaluation for low back pain, cauda equina syndrome. EXAM: MRI THORACIC AND LUMBAR SPINE WITHOUT CONTRAST TECHNIQUE: Multiplanar and multiecho pulse sequences of the thoracic and lumbar spine were obtained without intravenous contrast. COMPARISON:  None. FINDINGS: MRI THORACIC SPINE FINDINGS Alignment: Mild dextroscoliosis. Alignment otherwise normal with preservation of the normal thoracic kyphosis. No listhesis. Vertebrae: Vertebral body height maintained without acute or chronic fracture. Bone marrow signal intensity within normal limits. No discrete or worrisome osseous lesions or abnormal marrow edema. Cord:  Normal signal and morphology. Paraspinal and other soft tissues: Unremarkable. Disc levels: Normal for age multilevel disc desiccation seen throughout the thoracic spine. No significant disc bulge or focal disc herniation. No significant facet pathology. No canal or neural foraminal stenosis or evidence for neural impingement. MRI LUMBAR SPINE FINDINGS Segmentation:  Examination degraded by motion artifact. Standard segmentation. Lowest well-formed disc space labeled the L5-S1 level. Alignment: Dextroscoliosis with straightening of the normal lumbar lordosis. Trace degenerative retrolisthesis of L3 on L4 through L5 on S1. Vertebrae: Vertebral body height maintained without acute or chronic fracture. Bone marrow signal intensity  within normal limits. No worrisome osseous lesions. Discogenic reactive endplate change present about the L3-4 interspace. No other abnormal marrow edema. Conus medullaris and cauda equina: Conus extends to the L1 level. Conus and cauda equina appear normal. Paraspinal and other soft tissues: Chronic postoperative changes present within the posterior paraspinous soft tissues. No adverse features. Few scattered benign  appearing cyst noted about the kidneys. Visualized visceral structures otherwise unremarkable. Disc levels: L1-2: Mild disc bulge with disc desiccation and reactive endplate spurring. Disc bulging asymmetric to the right. Mild facet and ligament flavum hypertrophy. No significant spinal stenosis. Foramina remain patent. L2-3: Mild intervertebral disc space narrowing with diffuse disc bulge and disc desiccation. Associated reactive endplate spurring. Changes asymmetric to the left. Mild to moderate bilateral facet hypertrophy. Resultant mild to moderate canal with left lateral recess stenosis. Mild left L2 foraminal narrowing. Right neural foramen remains patent. L3-4: Degenerative intervertebral disc space narrowing with disc desiccation and diffuse disc bulge. Associated discogenic reactive endplate change with marginal endplate osteophytic spurring. Moderate bilateral facet hypertrophy. Probable remote posterior decompression. Persistent moderate to severe canal with left worse than right lateral recess stenosis. Moderate bilateral L3 foraminal narrowing. L4-5: Mild disc bulge with disc desiccation, asymmetric to the right. Associated reactive endplate spurring, also worse on the right. Severe right worse than left facet arthrosis. Prior posterior decompression. Residual mild narrowing of the lateral recesses bilaterally. Central canal remains patent. Moderate right with mild left L4 foraminal stenosis. L5-S1: Mild disc bulge with disc desiccation. Moderate left worse than right facet arthrosis. Prior posterior decompression. No significant spinal stenosis. Moderate right with mild left L5 foraminal narrowing. IMPRESSION: MR THORACIC SPINE IMPRESSION: No acute abnormality within the thoracic spine. No significant disc pathology or stenosis. No neural impingement. MR LUMBAR SPINE IMPRESSION: 1. No acute abnormality within the lumbar spine. 2. Postoperative changes from prior posterior decompression at L3-4 through  L5-S1. Persistent moderate to severe canal with left worse than right lateral recess stenosis at the L3-4 level with moderate bilateral L3 foraminal narrowing. 3. Right eccentric disc osteophyte and facet arthrosis at L4-5 and L5-S1 with resultant moderate right L4 and L5 foraminal stenosis. 4. Left eccentric disc bulge and facet hypertrophy at L2-3 with resultant mild to moderate canal and left lateral recess stenosis. Electronically Signed   By: Jeannine Boga M.D.   On: 03/01/2021 00:11   DG Chest Port 1 View  Result Date: 02/28/2021 CLINICAL DATA:  Weakness EXAM: PORTABLE CHEST 1 VIEW COMPARISON:  01/20/2021 FINDINGS: Prior valve replacement and CABG. Insert COPD Heart and mediastinal contours are within normal limits. No focal opacities or effusions. No acute bony abnormality. IMPRESSION: COPD.  No active disease. Electronically Signed   By: Rolm Baptise M.D.   On: 02/28/2021 19:21   VAS [place] ABI WITH/WO TBI  Result Date: 03/01/2021  LOWER EXTREMITY DOPPLER STUDY Patient Name:  ALLESANDRA HUEBSCH  Date of Exam:   03/01/2021 Medical Rec #: [id number]        Accession #:    [id number] Date of Birth: 1940/08/16       Patient Gender: F Patient Age:   28 years Exam Location:  [facility] Procedure:      VAS [place] ABI WITH/WO TBI Referring Phys: Nyoka Lint DOUTOVA --------------------------------------------------------------------------------  Indications: Peripheral artery disease, and leg numbness. High Risk Factors: Hypertension, hyperlipidemia, Diabetes, past history of                    smoking, coronary artery disease. Other Factors:  CKD4, S/p CABG & AVR, PAD.  Vascular Interventions: RLE angioplasty and stent placement (04/29/2019) & LLE                         arteriogram without intervention (07/01/2019). Comparison Study: Previous exam 09/09/2020 RT ABI 0.65 LT 0.80 Performing Technologist: Hill, Jody RVT, RDMS  Examination Guidelines: A complete evaluation includes at minimum, Doppler waveform  signals and systolic blood pressure reading at the level of bilateral brachial, anterior tibial, and posterior tibial arteries, when vessel segments are accessible. Bilateral testing is considered an integral part of a complete examination. Photoelectric Plethysmograph (PPG) waveforms and toe systolic pressure readings are included as required and additional duplex testing as needed. Limited examinations for reoccurring indications may be performed as noted.  ABI Findings: +---------+------------------+-----+----------+--------+  Right     Rt Pressure (mmHg) Index Waveform   Comment   +---------+------------------+-----+----------+--------+  Brachial  187                      triphasic            +---------+------------------+-----+----------+--------+  PTA       88                 0.47  monophasic           +---------+------------------+-----+----------+--------+  DP        78                 0.42  monophasic           +---------+------------------+-----+----------+--------+  Great Toe                          Absent               +---------+------------------+-----+----------+--------+ +---------+------------------+-----+----------+-------+  Left      Lt Pressure (mmHg) Index Waveform   Comment  +---------+------------------+-----+----------+-------+  Brachial  183                      triphasic           +---------+------------------+-----+----------+-------+  PTA       44                 0.24  monophasic          +---------+------------------+-----+----------+-------+  DP        61                 0.33  monophasic          +---------+------------------+-----+----------+-------+  Great Toe                          Absent              +---------+------------------+-----+----------+-------+ +-------+-----------+-----------+------------+------------+  ABI/TBI Today's ABI Today's TBI Previous ABI Previous TBI  +-------+-----------+-----------+------------+------------+  Right   0.47        Absent      0.65          0.24          +-------+-----------+-----------+------------+------------+  Left    0.33        Absent      0.80         0.43          +-------+-----------+-----------+------------+------------+ Bilateral ABIs significantly decreased since last examination in 08/2020 L > R Bilateral TBIs have become  absent since last examination.  Summary: Right: Resting right ankle-brachial index indicates severe right lower extremity arterial disease. The right toe-brachial index is absent. Left: Resting left ankle-brachial index indicates severe left lower extremity arterial disease. The left toe-brachial index is absent.  *See table(s) above for measurements and observations.  Vascular consult recommended. Electronically signed by Harold Barban MD on 03/01/2021 at 9:00:20 PM.    Final    ECHOCARDIOGRAM COMPLETE  Result Date: 03/01/2021    ECHOCARDIOGRAM REPORT   Patient Name:   ADEOLA DENNEN Date of Exam: 03/01/2021 Medical Rec #:  [id number]       Height:       63.0 in Accession #:    [id number]      Weight:       147.0 lb Date of Birth:  Jul 03, 1940      BSA:          1.697 m Patient Age:    89 years        BP:           135/69 mmHg Patient Gender: F               HR:           96 bpm. Exam Location:  Inpatient Procedure: 2D Echo, Color Doppler and Cardiac Doppler Indications:    Elevated troponin  History:        Patient has prior history of Echocardiogram examinations. Risk                 Factors:Hypertension, Dyslipidemia and Former Smoker.                 Aortic Valve: 23 mm Edwards Inspiris Resilia valve is present in                 the aortic position.  Sonographer:    Jyl Heinz Referring Phys: [place]  1. Left ventricular ejection fraction, by estimation, is 55 to 60%. The left ventricle has normal function. The left ventricle has no regional wall motion abnormalities. Left ventricular diastolic parameters are consistent with Grade I diastolic dysfunction (impaired relaxation).  2. Right  ventricular systolic function is normal. The right ventricular size is normal. Tricuspid regurgitation signal is inadequate for assessing PA pressure.  3. The mitral valve is grossly normal. Mild mitral valve regurgitation.  4. 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 Not well visualized, no significant paravalvular leak. AV max velocity 1.7 m/s. Aortic valve regurgitation is not visualized. There is a 23 mm Edwards Inspiris Resilia valve present in the aortic position.  5. The inferior vena cava is normal in size with greater than 50% respiratory variability, suggesting right atrial pressure of 3 mmHg. Comparison(s): No significant change from prior study. FINDINGS  Left Ventricle: Left ventricular ejection fraction, by estimation, is 55 to 60%. The left ventricle has normal function. The left ventricle has no regional wall motion abnormalities. The left ventricular internal cavity size was normal in size. There is  no left ventricular hypertrophy. Left ventricular diastolic parameters are consistent with Grade I diastolic dysfunction (impaired relaxation). Right Ventricle: The right ventricular size is normal. No increase in right ventricular wall thickness. Right ventricular systolic function is normal. Tricuspid regurgitation signal is inadequate for assessing PA pressure. Left Atrium: Left atrial size was normal in size. Right Atrium: Right atrial size was normal in size. Pericardium: There is no evidence of pericardial effusion. Mitral Valve: The mitral valve is  grossly normal. Mild mitral valve regurgitation. MV peak gradient, 13.8 mmHg. The mean mitral valve gradient is 7.0 mmHg. Tricuspid Valve: The tricuspid valve is normal in structure. Tricuspid valve regurgitation is not demonstrated. Aortic Valve: S/p 23 mm Edwards Inspiris Resilia Valve 05/04/2020. Not well visualized, no significant paravalvular leak. AV max velocity 1.7 m/s. Aortic valve regurgitation is not visualized. Aortic valve peak  gradient measures 12.2 mmHg. There is a 23 mm Edwards Inspiris Resilia valve present in the aortic position. Pulmonic Valve: The pulmonic valve was not well visualized. Aorta: The aortic root is normal in size and structure. Venous: The inferior vena cava is normal in size with greater than 50% respiratory variability, suggesting right atrial pressure of 3 mmHg. IAS/Shunts: No atrial level shunt detected by color flow Doppler.  LEFT VENTRICLE PLAX 2D LVIDd:         4.00 cm     Diastology LVIDs:         2.80 cm     LV e' medial:    3.37 cm/s LV PW:         0.90 cm     LV E/e' medial:  38.3 LV IVS:        0.90 cm     LV e' lateral:   4.13 cm/s LVOT diam:     2.00 cm     LV E/e' lateral: 31.2 LV SV:         55 LV SV Index:   32 LVOT Area:     3.14 cm  LV Volumes (MOD) LV vol d, MOD A2C: 88.8 ml LV vol d, MOD A4C: 80.9 ml LV vol s, MOD A2C: 36.5 ml LV vol s, MOD A4C: 33.7 ml LV SV MOD A2C:     52.3 ml LV SV MOD A4C:     80.9 ml LV SV MOD BP:      48.7 ml RIGHT VENTRICLE            IVC RV Basal diam:  2.90 cm    IVC diam: 1.20 cm RV Mid diam:    2.30 cm RV S prime:     6.64 cm/s TAPSE (M-mode): 1.5 cm LEFT ATRIUM             Index        RIGHT ATRIUM           Index LA diam:        3.80 cm 2.24 cm/m   RA Area:     13.60 cm LA Vol (A2C):   54.4 ml 32.06 ml/m  RA Volume:   32.10 ml  18.92 ml/m LA Vol (A4C):   37.7 ml 22.22 ml/m LA Biplane Vol: 46.5 ml 27.41 ml/m  AORTIC VALVE AV Area (Vmax): 1.69 cm AV Vmax:        174.50 cm/s AV Peak Grad:   12.2 mmHg LVOT Vmax:      93.95 cm/s LVOT Vmean:     66.700 cm/s LVOT VTI:       0.174 m  AORTA Ao Root diam: 3.10 cm MITRAL VALVE MV Area (PHT): 5.38 cm     SHUNTS MV Area VTI:   1.79 cm     Systemic VTI:  0.17 m MV Peak grad:  13.8 mmHg    Systemic Diam: 2.00 cm MV Mean grad:  7.0 mmHg MV Vmax:       1.86 m/s MV Vmean:      128.0 cm/s MV Decel Time: 141 msec MR Peak  grad: 149.8 mmHg MR Mean grad: 103.0 mmHg MR Vmax:      612.00 cm/s MR Vmean:     491.0 cm/s MV E velocity:  129.00 cm/s MV A velocity: 174.00 cm/s MV E/A ratio:  0.74 Mary Scientist, physiological signed by [name] Signature Date/Time: 03/01/2021/3:04:51 PM    Final    VAS [place] LOWER EXTREMITY VENOUS (DVT)  Result Date: 03/01/2021  Lower Venous DVT Study Patient Name:  KABAO LEITE  Date of Exam:   03/01/2021 Medical Rec #: [id number]        Accession #:    [id number] Date of Birth: 1940/05/22       Patient Gender: F Patient Age:   48 years Exam Location:  [facility] Procedure:      VAS [place] LOWER EXTREMITY VENOUS (DVT) Referring Phys: Nyoka Lint DOUTOVA --------------------------------------------------------------------------------  Other Indications: Edema per MD order. No edema present upon examination. Comparison Study: No previous LEV exams Performing Technologist: Jody Hill RVT, RDMS  Examination Guidelines: A complete evaluation includes B-mode imaging, spectral Doppler, color Doppler, and power Doppler as needed of all accessible portions of each vessel. Bilateral testing is considered an integral part of a complete examination. Limited examinations for reoccurring indications may be performed as noted. The reflux portion of the exam is performed with the patient in reverse Trendelenburg.  +---------+---------------+---------+-----------+----------+--------------+  RIGHT     Compressibility Phasicity Spontaneity Properties Thrombus Aging  +---------+---------------+---------+-----------+----------+--------------+  CFV       Full            Yes       Yes                                    +---------+---------------+---------+-----------+----------+--------------+  SFJ       Full                                                             +---------+---------------+---------+-----------+----------+--------------+  FV Prox   Full            Yes       Yes                                    +---------+---------------+---------+-----------+----------+--------------+  FV Mid    Full            Yes       Yes                                     +---------+---------------+---------+-----------+----------+--------------+  FV Distal Full            Yes       Yes                                    +---------+---------------+---------+-----------+----------+--------------+  PFV       Full                                                             +---------+---------------+---------+-----------+----------+--------------+  POP       Full            Yes       Yes                                    +---------+---------------+---------+-----------+----------+--------------+  PTV       Full                                                             +---------+---------------+---------+-----------+----------+--------------+  PERO      Full                                                             +---------+---------------+---------+-----------+----------+--------------+   +---------+---------------+---------+-----------+----------+--------------+  LEFT      Compressibility Phasicity Spontaneity Properties Thrombus Aging  +---------+---------------+---------+-----------+----------+--------------+  CFV       Full            Yes       Yes                                    +---------+---------------+---------+-----------+----------+--------------+  SFJ       Full                                                             +---------+---------------+---------+-----------+----------+--------------+  FV Prox   Full            Yes       Yes                                    +---------+---------------+---------+-----------+----------+--------------+  FV Mid    Full            Yes       Yes                                    +---------+---------------+---------+-----------+----------+--------------+  FV Distal Full            Yes       Yes                                    +---------+---------------+---------+-----------+----------+--------------+  PFV       Full                                                              +---------+---------------+---------+-----------+----------+--------------+  POP       Full            Yes       Yes                                    +---------+---------------+---------+-----------+----------+--------------+  PTV       Full                                                             +---------+---------------+---------+-----------+----------+--------------+  PERO      Full                                                             +---------+---------------+---------+-----------+----------+--------------+     Summary: BILATERAL: - No evidence of deep vein thrombosis seen in the lower extremities, bilaterally. - No evidence of superficial venous thrombosis in the lower extremities, bilaterally. -No evidence of popliteal cyst, bilaterally.   *See table(s) above for measurements and observations. Electronically signed by Harold Barban MD on 03/01/2021 at 9:00:56 PM.    Final     Cardiac Studies   Echo 03/01/2021   1. Left ventricular ejection fraction, by estimation, is 55 to 60%. The  left ventricle has normal function. The left ventricle has no regional  wall motion abnormalities. Left ventricular diastolic parameters are  consistent with Grade I diastolic  dysfunction (impaired relaxation).   2. Right ventricular systolic function is normal. The right ventricular  size is normal. Tricuspid regurgitation signal is inadequate for assessing  PA pressure.   3. The mitral valve is grossly normal. Mild mitral valve regurgitation.   4. 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 Not well  visualized, no significant paravalvular leak. AV max velocity 1.7 m/s.  Aortic valve regurgitation is not visualized. There is a 23 mm Edwards  Inspiris Resilia valve present in the aortic  position.   5. The inferior vena cava is normal in size with greater than 50%  respiratory variability, suggesting right atrial pressure of 3 mmHg.   Comparison(s): No significant change from prior study.    Patient Profile     81 y.o. 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 Opted for medical management of elevated troponins.  - Was started on IV heparin  She has had CABG and likely has some small vessels that were no bypassed that could be the cause of her trop elevation .  - Continue daily ASA,  BB  - Given patient's lack of symptoms, no EKG changes, and poor renal function, possible that elevated troponin due to demand ischemia    HTN  - On metoprolol, losartan at home  - Losartan held due to elevated creatinine  - BP is somewhat elevated, could increase metoprolol to 25 mg BID to compensate for held losartan   PAD, nonhealing foot wounds - Vascular surgery consulting  - Repeat aortogram, lower extremity arteriogram, and possible intervention planned for tomorrow   Weakness and fall  - primary reason for coming to the hospital  - no LOC  - Lyrica recently started, could be cultprit? Lyrica held and will monitor for symptom improvement  - Patient reports that her weakness has significantly improved. PT/OT involved in care    CKD stage 4  - Creatinine 1.95 on presentation. Baseline appears to be around 1.7  - Losartan held, continue to hold other nephrotoxic medications  Otherwise managed per primary  DM Diabetic peripheral neuropathy  Hyponatremia  GERD Breast Cancer Hypothyroidism      For questions or updates, please contact  [place] Please consult [email or web address] for contact info under        Signed, Margie Billet, PA-C  03/02/2021, 9:53 AM     Attending Note:   The patient was seen and examined.  Agree with assessment and plan as noted above.  Changes made to the above note as needed.  Patient seen and independently examined with Vikki Ports, PA .   We discussed all aspects of the encounter. I agree with the assessment and plan as stated above.    Elevated troponins:  she has not had any cardiac symptoms.  I'm not entirely sure why the troponins were ordered.   She had acute on chronic renal insufficiency upon admission .   She fell several times and was on the floor of her house for a while but not for hours according to her .    I'm not inclined to pursue this elevated troponin any further  - she is hemodynamically stable,  has no symptoms.  Agree with 48 heparin.  If she does well, the heparin can be stopped tomorrow from a cardiac standpoint.  Im not sure if she has a PV indication for heparin.  2.  PAD :  Dr. Carlis Abbott plan a lower ext angiogram tomorrow     I have spent a total of 40 minutes with patient reviewing hospital  notes , telemetry, EKGs, labs and examining patient as well as establishing an assessment and plan that was discussed with the patient.  > 50% of time was spent in direct patient care.    Thayer Headings, Brooke Bonito., MD, [facility] 03/02/2021, 2:02 PM [address]  [place] Pager [phone]

## 2021-03-02 NOTE — Progress Notes (Addendum)
ANTICOAGULATION CONSULT NOTE- follow-up  Pharmacy Consult for heparin Indication: chest pain/ACS  Allergies  Allergen Reactions   Codeine Nausea And Vomiting and Other (See Comments)    Severe stomach cramps, also   Antihistamines, Diphenhydramine-Type Other (See Comments)    Causes hyperactivity   Gabapentin     Urinary incontinence   Statins Other (See Comments)    Joint pains   Sulfa Antibiotics Other (See Comments)    Reaction not recalled   Erythromycin Hives, Rash and Other (See Comments)    Allergic due to dental work from 50 years ago. It was used in packing and resulted in rash/hives inside and outside of mouth.   Trulicity [Dulaglutide] Itching    Patient Measurements: Weight: 66.7 kg (147 lb 0.8 oz) Heparin Dosing Weight: 65.9 kg   Vital Signs: Temp: 98 F (36.7 C) (02/09 0831) Temp Source: Oral (02/09 0831) BP: 143/62 (02/09 0831) Pulse Rate: 96 (02/09 0831)  Labs: Recent Labs    02/28/21 1128 02/28/21 1128 02/28/21 1806 03/01/21 0128 03/01/21 0423 03/01/21 0803 03/01/21 2137 03/02/21 0042 03/02/21 0640  HGB 12.7  --   --  10.7* 11.6*  --   --   --  12.7  HCT 40.0  --   --  32.6* 35.8*  --   --   --  37.5  PLT 176  --   --  154 139*  --   --   --  174  HEPARINUNFRC  --    < >  --  0.40  --  0.13* <0.10*  --  0.45  CREATININE 1.95*  --   --  1.76* 1.70*  --   --  1.58*  --   CKTOTAL  --   --   --  64  --   --   --   --   --   TROPONINIHS 188*  --  309* 127*  --   --   --   --   --    < > = values in this interval not displayed.    Estimated Creatinine Clearance: 26.9 mL/min (A) (by C-G formula based on SCr of 1.58 mg/dL (H)).  Assessment: 81 YO female admitted for weakness. PMH significant for aortic valve replacement, s/p CABG x2, CAD, T2DM, and CKD. Not on anticoagulation prior to admission.   Heparin level is therapeutic at 0.45 on 1150 units/hr  Goal of Therapy:  Heparin level 0.3-0.7 units/ml Monitor platelets by anticoagulation  protocol: Yes   Plan:  Continue heparin infusion @ 1150 units/hr Daily heparin level, CBC Monitor for s/sx of bleeding  Thank you for involving pharmacy in this patient's care.  Vaughan Basta BS, PharmD, BCPS Clinical Pharmacist 03/02/2021 8:49 AM

## 2021-03-02 NOTE — Telephone Encounter (Signed)
Called the pts daughter [name] (on [place]) about called placed earlier. [name] is inquiring if Cardiology is or has consulted on her mother yet in the hospital.  Pt is currently admitted to [facility] for AKI and NSTEMI.   Endorsed to the pts daughter that Cardiology has consulted on her this morning.  Advised the pts daughter if she is needing information regarding the pts cardiac care/plan/work-up, she should call the unit in which the pt is admitted to, and ask to speak with the RN caring for her Mother today.  Cathy verbalized understanding and agrees with this plan.  [name] was appreciative for al the assistance provided.  Will send this message to pts Primary Cardiologist and covering RN, as a general FYI.

## 2021-03-02 NOTE — Consult Note (Signed)
[place] Nurse Consult Note: Reason for Consult: bilateral heels with fissures, chronic, nonhealing.  Also left great toe with nail removal by Podiatric Medicine provider in [facility] [place] 5 weeks ago.  Since that time, neuropathic pain has been "much worse" Wound type: Neuropathic, xerosis Pressure Injury POA: N/A Measurement: largest fissure is on left foot/heel and measures 3cm in length. Left great toe lesion is 1cm x 0.2cm with no appreciable depth Wound bed:Dry, red Drainage (amount, consistency, odor) none Periwound: intact, dry Dressing procedure/placement/frequency:   Recommend consultation with Vascular Surgery for neuropathic pain options.   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  [place] nursing team will not follow, but will remain available to this patient, the nursing and medical teams.  Please re-consult if needed. Thanks, Maudie Flakes, MSN, RN, [place], Arther Abbott  Pager# [phone]

## 2021-03-02 NOTE — Evaluation (Signed)
Physical Therapy Evaluation Patient Details Name: [name] MRN: [id number] DOB: 02/19/1940 Today's Date: 03/02/2021  History of Present Illness  Pt is a 81 y.o. female admitted for  elevated troponin and leg weakness. Found to have AKI and NSTEMI. PMH includes CAD (s/p CABG, AVR), DM2, breast CA, CKD IV, HTN, OA, PAD, RA, stenosis., neuropathy, Covid.  Clinical Impression  Patient admitted with above diagnosis. Patient presents with generalized weakness, impaired balance, and decreased activity tolerance. Patient requires UE support of IV pole for ambulation due to unsteadiness. Encouraged patient to utilize RW for support during mobility at home to reduce risk for falls due to bilateral foot pain, patient verbalized understanding. Patient will benefit from skilled PT services during acute stay to address listed deficits. Recommend HHPT at discharge to maximize functional mobility and safety.        Recommendations for follow up therapy are one component of a multi-disciplinary discharge planning process, led by the attending physician.  Recommendations may be updated based on patient status, additional functional criteria and insurance authorization.  Follow Up Recommendations Home health PT    Assistance Recommended at Discharge Intermittent Supervision/Assistance  Patient can return home with the following  Help with stairs or ramp for entrance    Equipment Recommendations Rolling [place] (2 wheels)  Recommendations for Other Services       Functional Status Assessment Patient has had a recent decline in their functional status and demonstrates the ability to make significant improvements in function in a reasonable and predictable amount of time.     Precautions / Restrictions Precautions Precautions: Fall Restrictions Weight Bearing Restrictions: No      Mobility  Bed Mobility Overal bed mobility: Modified Independent                  Transfers Overall  transfer level: Needs assistance Equipment used: None Transfers: Sit to/from Stand Sit to Stand: Supervision                Ambulation/Gait Ambulation/Gait assistance: Min guard, Supervision Gait Distance (Feet): 100 Feet Assistive device: IV Pole Gait Pattern/deviations: Step-through pattern, Decreased stride length, Drifts right/left Gait velocity: decreased     General Gait Details: mild unsteadiness requiring use of single UE support  Stairs            Wheelchair Mobility    Modified Rankin (Stroke Patients Only)       Balance Overall balance assessment: Needs assistance Sitting-balance support: No upper extremity supported Sitting balance-Leahy Scale: Good     Standing balance support: Single extremity supported, During functional activity Standing balance-Leahy Scale: Fair                               Pertinent Vitals/Pain Pain Assessment Pain Assessment: Faces Faces Pain Scale: Hurts little more Pain Location: B feet Pain Descriptors / Indicators: Burning Pain Intervention(s): Monitored during session    Home Living Family/patient expects to be discharged to:: Private residence Living Arrangements: Spouse/significant other Available Help at Discharge: Family;Available 24 hours/day Type of Home: House Home Access: Stairs to enter Entrance Stairs-Rails: Psychiatric nurse of Steps: 5   Home Layout: One level Home Equipment: Conservation officer, nature (2 wheels) Additional Comments: Has access to tub seat if needed    Prior Function Prior Level of Function : Independent/Modified Independent;Driving             Mobility Comments: Typically mod indep without DME, admits to furniture  surfing around home, relies on shopping cart at stores ADLs Comments: Independent without DME     Hand Dominance   Dominant Hand: Right    Extremity/Trunk Assessment   Upper Extremity Assessment Upper Extremity Assessment: Defer to OT  evaluation    Lower Extremity Assessment Lower Extremity Assessment: Generalized weakness    Cervical / Trunk Assessment Cervical / Trunk Assessment: Normal (past CABG 04/2020)  Communication   Communication: HOH  Cognition Arousal/Alertness: Awake/alert Behavior During Therapy: WFL for tasks assessed/performed Overall Cognitive Status: Within Functional Limits for tasks assessed                                          General Comments      Exercises     Assessment/Plan    PT Assessment Patient needs continued PT services  PT Problem List Decreased strength;Decreased activity tolerance;Decreased balance       PT Treatment Interventions DME instruction;Gait training;Stair training;Functional mobility training;Therapeutic activities;Therapeutic exercise;Balance training;Patient/family education    PT Goals (Current goals can be found in the Care Plan section)  Acute Rehab PT Goals Patient Stated Goal: to go home to her grandbaby PT Goal Formulation: With patient Time For Goal Achievement: 03/16/21 Potential to Achieve Goals: Good    Frequency Min 3X/week     Co-evaluation               AM-PAC PT "6 Clicks" Mobility  Outcome Measure Help needed turning from your back to your side while in a flat bed without using bedrails?: None Help needed moving from lying on your back to sitting on the side of a flat bed without using bedrails?: None Help needed moving to and from a bed to a chair (including a wheelchair)?: A Little Help needed standing up from a chair using your arms (e.g., wheelchair or bedside chair)?: A Little Help needed to walk in hospital room?: A Little Help needed climbing 3-5 steps with a railing? : A Little 6 Click Score: 20    End of Session   Activity Tolerance: Patient tolerated treatment well Patient left: in chair;with call bell/phone within reach Nurse Communication: Mobility status PT Visit Diagnosis: Other  abnormalities of gait and mobility (R26.89)    Time: 1045-1105 PT Time Calculation (min) (ACUTE ONLY): 20 min   Charges:   PT Evaluation $PT Eval Moderate Complexity: 1 Mod          Tamelia Michalowski A. Gilford Rile PT, DPT Acute Rehabilitation Services Pager [phone] Office [phone]   Linna Hoff 03/02/2021, 1:48 PM

## 2021-03-02 NOTE — Progress Notes (Signed)
Initial Nutrition Assessment  DOCUMENTATION CODES:  Not applicable  INTERVENTION:  Add Ensure Plus High Protein po BID, each supplement provides 350 kcal and 20 grams of protein.   Add MVI with minerals daily.  Encourage PO and supplement intake.  NUTRITION DIAGNOSIS:  Increased nutrient needs related to acute illness (Influenza A) as evidenced by estimated needs.  GOAL:  Patient will meet greater than or equal to 90% of their needs  MONITOR:  PO intake, Supplement acceptance, Labs, Weight trends, I & O's  REASON FOR ASSESSMENT:  Malnutrition Screening Tool    ASSESSMENT:  81 yo female with a PMH of T2DM, HTN, HLD, CAD s/p CABG, recent COVID-19 infection, and PAD with claudication who presents with elevated troponin and leg weakness. Admitted with Influenza A.  RD working remotely. Attempted to call patient's room phone. Pt did not answer.   Per Epic, pt ate 100% of dinner last night and 75% of breakfast this morning.   Per Epic, pt's weight appears stable over the past 2.5 months.  Of note, pt with no edema present.  Suspect that pt is malnourished to some degree but cannot definitively diagnose at this time.  Medications: reviewed; SSI, Semglee, Synthroid, Vicodin PO PRN (given twice today)  Labs: reviewed; CBG 128-223 (H) HbA1c: 11.6% (03/01/21)  NUTRITION - FOCUSED PHYSICAL EXAM: Unable to perform - defer to in-person follow-up  Diet Order:   Diet Order             Diet Carb Modified Fluid consistency: Thin; Room service appropriate? Yes  Diet effective now                  EDUCATION NEEDS:  No education needs have been identified at this time  Skin:  Skin Assessment: Reviewed RN Assessment  Last BM:  03/01/21  Height:  Ht Readings from Last 1 Encounters:  02/24/21 5\' 3"  (1.6 m)   Weight:  Wt Readings from Last 1 Encounters:  02/28/21 66.7 kg   BMI:  Body mass index is 26.05 kg/m.  Estimated Nutritional Needs:  Kcal:  1900-2100 Protein:   80-95 grams Fluid:  >1.9 L  Derrel Nip, RD, LDN (she/her/hers) Clinical Inpatient Dietitian RD Pager/After-Hours/Weekend Pager # in [place]

## 2021-03-02 NOTE — Telephone Encounter (Signed)
Daughter of the patient called. The patient is in the hospital and would like to know if the Cardiology team would be rounding to see her today. The patient went to the hospital because she did not have use of her legs or hands and was told of some heart issues once she was admitted. The daughter is concerned about the patient.  Please advise

## 2021-03-03 ENCOUNTER — Encounter (HOSPITAL_COMMUNITY)
Admission: EM | Disposition: A | Discharge status: Rehabilitation Facility | Payer: Self-pay | Source: Home / Self Care | Attending: Internal Medicine

## 2021-03-03 DIAGNOSIS — I700 Atherosclerosis of aorta: Secondary | ICD-10-CM

## 2021-03-03 DIAGNOSIS — J101 Influenza due to other identified influenza virus with other respiratory manifestations: Secondary | ICD-10-CM | POA: Diagnosis not present

## 2021-03-03 DIAGNOSIS — L97529 Non-pressure chronic ulcer of other part of left foot with unspecified severity: Secondary | ICD-10-CM

## 2021-03-03 DIAGNOSIS — I70245 Atherosclerosis of native arteries of left leg with ulceration of other part of foot: Secondary | ICD-10-CM

## 2021-03-03 DIAGNOSIS — I70248 Atherosclerosis of native arteries of left leg with ulceration of other part of lower left leg: Secondary | ICD-10-CM

## 2021-03-03 HISTORY — PX: ABDOMINAL AORTOGRAM W/LOWER EXTREMITY: CATH118223

## 2021-03-03 LAB — GLUCOSE, CAPILLARY
Glucose-Capillary: 251 mg/dL — ABNORMAL HIGH (ref 70–99)
Glucose-Capillary: 272 mg/dL — ABNORMAL HIGH (ref 70–99)
Glucose-Capillary: 183 mg/dL — ABNORMAL HIGH (ref 70–99)
Glucose-Capillary: 167 mg/dL — ABNORMAL HIGH (ref 70–99)
Glucose-Capillary: 267 mg/dL — ABNORMAL HIGH (ref 70–99)

## 2021-03-03 LAB — CBC
HCT: 36 % (ref 36.0–46.0)
HCT: 38.1 % (ref 36.0–46.0)
Hemoglobin: 11.9 g/dL — ABNORMAL LOW (ref 12.0–15.0)
Hemoglobin: 12.1 g/dL (ref 12.0–15.0)
MCH: 30.1 pg (ref 26.0–34.0)
MCH: 29.3 pg (ref 26.0–34.0)
MCHC: 33.1 g/dL (ref 30.0–36.0)
MCHC: 31.8 g/dL (ref 30.0–36.0)
MCV: 91.1 fL (ref 80.0–100.0)
MCV: 92.3 fL (ref 80.0–100.0)
Platelets: 187 10*3/uL (ref 150–400)
Platelets: 191 10*3/uL (ref 150–400)
RBC: 3.95 MIL/uL (ref 3.87–5.11)
RBC: 4.13 MIL/uL (ref 3.87–5.11)
RDW: 15 % (ref 11.5–15.5)
RDW: 15.2 % (ref 11.5–15.5)
WBC: 7.2 10*3/uL (ref 4.0–10.5)
WBC: 9.9 10*3/uL (ref 4.0–10.5)
nRBC: 0 % (ref 0.0–0.2)
nRBC: 0 % (ref 0.0–0.2)

## 2021-03-03 LAB — BASIC METABOLIC PANEL
Anion gap: 12 (ref 5–15)
BUN: 26 mg/dL — ABNORMAL HIGH (ref 8–23)
CO2: 23 mmol/L (ref 22–32)
Calcium: 8.7 mg/dL — ABNORMAL LOW (ref 8.9–10.3)
Chloride: 99 mmol/L (ref 98–111)
Creatinine, Ser: 1.62 mg/dL — ABNORMAL HIGH (ref 0.44–1.00)
GFR, Estimated: 32 mL/min — ABNORMAL LOW (ref >60)
Glucose, Bld: 278 mg/dL — ABNORMAL HIGH (ref 70–99)
Potassium: 4.2 mmol/L (ref 3.5–5.1)
Sodium: 134 mmol/L — ABNORMAL LOW (ref 135–145)

## 2021-03-03 LAB — CREATININE, SERUM
Creatinine, Ser: 1.56 mg/dL — ABNORMAL HIGH (ref 0.44–1.00)
GFR, Estimated: 34 mL/min — ABNORMAL LOW (ref >60)

## 2021-03-03 LAB — HEPARIN LEVEL (UNFRACTIONATED): Heparin Unfractionated: 0.43 IU/mL (ref 0.30–0.70)

## 2021-03-03 SURGERY — ABDOMINAL AORTOGRAM W/LOWER EXTREMITY
Anesthesia: LOCAL | Laterality: Left

## 2021-03-03 MED ORDER — HYDRALAZINE HCL 20 MG/ML IJ SOLN
5.0000 mg | INTRAMUSCULAR | Status: AC | PRN
  Administered 2021-03-05 (×2): 5 mg via INTRAVENOUS
  Filled 2021-03-03 (×2): qty 1

## 2021-03-03 MED ORDER — HYDROCODONE-ACETAMINOPHEN 5-325 MG PO TABS
ORAL_TABLET | ORAL | Status: AC
  Filled 2021-03-03: qty 1

## 2021-03-03 MED ORDER — SODIUM CHLORIDE 0.9 % WEIGHT BASED INFUSION: 1.0000 mL/kg/h | INTRAVENOUS | Status: DC

## 2021-03-03 MED ORDER — ASPIRIN EC 81 MG PO TBEC
81.0000 mg | DELAYED_RELEASE_TABLET | Freq: Every day | ORAL | Status: DC
  Administered 2021-03-05 – 2021-03-13 (×9): 81 mg via ORAL
  Filled 2021-03-03 (×10): qty 1

## 2021-03-03 MED ORDER — IODIXANOL 320 MG/ML IV SOLN
INTRAVENOUS | Status: DC | PRN
  Administered 2021-03-03: 25 mL

## 2021-03-03 MED ORDER — POLYETHYLENE GLYCOL 3350 17 G PO PACK
17.0000 g | PACK | Freq: Once | ORAL | Status: AC | PRN
  Administered 2021-03-03: 17 g via ORAL
  Filled 2021-03-03: qty 1

## 2021-03-03 MED ORDER — SODIUM CHLORIDE 0.9 % IV SOLN: 250.0000 mL | INTRAVENOUS | Status: DC | PRN

## 2021-03-03 MED ORDER — SODIUM CHLORIDE 0.9 % IV SOLN: INTRAVENOUS | Status: DC

## 2021-03-03 MED ORDER — LIDOCAINE HCL (PF) 1 % IJ SOLN
INTRAMUSCULAR | Status: AC
  Filled 2021-03-03: qty 30

## 2021-03-03 MED ORDER — MIDAZOLAM HCL 2 MG/2ML IJ SOLN
INTRAMUSCULAR | Status: AC
  Filled 2021-03-03: qty 2

## 2021-03-03 MED ORDER — FENTANYL CITRATE (PF) 100 MCG/2ML IJ SOLN
INTRAMUSCULAR | Status: DC | PRN
  Administered 2021-03-03: 25 ug via INTRAVENOUS
  Administered 2021-03-03: 50 ug via INTRAVENOUS

## 2021-03-03 MED ORDER — SODIUM CHLORIDE 0.9% FLUSH
3.0000 mL | Freq: Two times a day (BID) | INTRAVENOUS | Status: DC
  Administered 2021-03-04 – 2021-03-13 (×15): 3 mL via INTRAVENOUS

## 2021-03-03 MED ORDER — HEPARIN (PORCINE) IN NACL 1000-0.9 UT/500ML-% IV SOLN
INTRAVENOUS | Status: DC | PRN
  Administered 2021-03-03 (×2): 500 mL

## 2021-03-03 MED ORDER — HEPARIN (PORCINE) IN NACL 1000-0.9 UT/500ML-% IV SOLN
INTRAVENOUS | Status: AC
  Filled 2021-03-03: qty 1000

## 2021-03-03 MED ORDER — FENTANYL CITRATE (PF) 100 MCG/2ML IJ SOLN
INTRAMUSCULAR | Status: AC
  Filled 2021-03-03: qty 2

## 2021-03-03 MED ORDER — ONDANSETRON HCL 4 MG/2ML IJ SOLN
4.0000 mg | Freq: Four times a day (QID) | INTRAMUSCULAR | Status: DC | PRN
  Administered 2021-03-05 – 2021-03-08 (×2): 4 mg via INTRAVENOUS
  Filled 2021-03-03 (×2): qty 2

## 2021-03-03 MED ORDER — MIDAZOLAM HCL 2 MG/2ML IJ SOLN
INTRAMUSCULAR | Status: DC | PRN
  Administered 2021-03-03: .5 mg via INTRAVENOUS

## 2021-03-03 MED ORDER — LABETALOL HCL 5 MG/ML IV SOLN
10.0000 mg | INTRAVENOUS | Status: DC | PRN
  Administered 2021-03-04 – 2021-03-05 (×3): 10 mg via INTRAVENOUS
  Filled 2021-03-03 (×4): qty 4

## 2021-03-03 MED ORDER — ACETAMINOPHEN 325 MG PO TABS
650.0000 mg | ORAL_TABLET | ORAL | Status: DC | PRN
  Administered 2021-03-04 – 2021-03-06 (×5): 650 mg via ORAL
  Filled 2021-03-03 (×5): qty 2

## 2021-03-03 MED ORDER — LIDOCAINE HCL (PF) 1 % IJ SOLN
INTRAMUSCULAR | Status: DC | PRN
  Administered 2021-03-03: 15 mL

## 2021-03-03 MED ORDER — SODIUM CHLORIDE 0.9% FLUSH: 3.0000 mL | INTRAVENOUS | Status: DC | PRN

## 2021-03-03 SURGICAL SUPPLY — 11 items
CATH OMNI FLUSH 5F 65CM (CATHETERS) ×1 IMPLANT
GUIDEWIRE ANGLED .035X150CM (WIRE) ×1 IMPLANT
KIT ANGIASSIST CO2 SYSTEM (KITS) ×1 IMPLANT
KIT MICROPUNCTURE NIT STIFF (SHEATH) ×1 IMPLANT
KIT PV (KITS) ×2 IMPLANT
SHEATH PINNACLE 5F 10CM (SHEATH) ×1 IMPLANT
SHEATH PROBE COVER 6X72 (BAG) ×1 IMPLANT
SYR MEDRAD MARK 7 150ML (SYRINGE) ×2 IMPLANT
TRANSDUCER W/STOPCOCK (MISCELLANEOUS) ×2 IMPLANT
TRAY PV CATH (CUSTOM PROCEDURE TRAY) ×2 IMPLANT
WIRE BENTSON .035X145CM (WIRE) ×1 IMPLANT

## 2021-03-03 NOTE — Progress Notes (Signed)
ANTICOAGULATION CONSULT NOTE- follow-up  Pharmacy Consult for heparin Indication: chest pain/ACS  Allergies  Allergen Reactions   Codeine Nausea And Vomiting and Other (See Comments)    Severe stomach cramps, also   Antihistamines, Diphenhydramine-Type Other (See Comments)    Causes hyperactivity   Gabapentin     Urinary incontinence   Statins Other (See Comments)    Joint pains   Sulfa Antibiotics Other (See Comments)    Reaction not recalled   Erythromycin Hives, Rash and Other (See Comments)    Allergic due to dental work from 50 years ago. It was used in packing and resulted in rash/hives inside and outside of mouth.   Trulicity [Dulaglutide] Itching    Patient Measurements: Weight: 66.7 kg (147 lb 0.8 oz) Heparin Dosing Weight: 65.9 kg   Vital Signs: Temp: 98.5 F (36.9 C) (02/10 0911) BP: 149/81 (02/10 0911) Pulse Rate: 109 (02/10 0911)  Labs: Recent Labs    02/28/21 1128 02/28/21 1806 03/01/21 0128 03/01/21 0423 03/01/21 0803 03/01/21 2137 03/02/21 0042 03/02/21 0640 03/03/21 0333  HGB 12.7  --  10.7* 11.6*  --   --   --  12.7 11.9*  HCT 40.0  --  32.6* 35.8*  --   --   --  37.5 36.0  PLT 176  --  154 139*  --   --   --  174 187  HEPARINUNFRC  --   --  0.40  --    < > <0.10*  --  0.45 0.43  CREATININE 1.95*  --  1.76* 1.70*  --   --  1.58*  --  1.62*  CKTOTAL  --   --  64  --   --   --   --   --   --   TROPONINIHS 188* 309* 127*  --   --   --   --   --   --    < > = values in this interval not displayed.    Estimated Creatinine Clearance: 26.3 mL/min (A) (by C-G formula based on SCr of 1.62 mg/dL (H)).  Assessment: 81 YO female admitted for weakness. PMH significant for aortic valve replacement, s/p CABG x2, CAD, T2DM, and CKD. Not on anticoagulation prior to admission.   Heparin level is therapeutic at 0.43 on 1150 units/hr  Goal of Therapy:  Heparin level 0.3-0.7 units/ml Monitor platelets by anticoagulation protocol: Yes   Plan:  Continue  heparin infusion @ 1150 units/hr Daily heparin level, CBC Monitor for s/sx of bleeding Follow-up on duration. Today is day 3 for the heparin infusion  Thank you for involving pharmacy in this patient's care.  Vaughan Basta BS, PharmD, BCPS Clinical Pharmacist 03/03/2021 9:26 AM

## 2021-03-03 NOTE — TOC Initial Note (Signed)
Transition of Care [facility]) - Initial/Assessment Note    Patient Details  Name: [name] MRN: [id number] Date of Birth: April 26, 1940  Transition of Care [facility]) CM/SW Contact:    [name], [name], RN Phone Number: 03/03/2021, 5:14 PM  Clinical Narrative:                 CM spoke with patient about needs for post hospital transition. Admitted for Influenza A. From home with husband. Has a walker and cane at home. PT/OT recommended home health. Referral made with [facility]. [name] [phone]) to reach back with CM as they have a huge delay with PT this week.  Patient had Cardiac Cath done today and will be transferring to a cardiac unit. Has scheduled foot amputation on Monday. Weekend CM to followup.   Barriers to Discharge: Continued Medical Work up   Patient Goals and CMS Choice Patient states their goals for this hospitalization and ongoing recovery are:: To return home CMS Medicare.gov Compare Post Acute Care list provided to:: Patient Choice offered to / list presented to : Patient  Expected Discharge Plan and Services     Discharge Planning Services: CM Consult Post Acute Care Choice: [place] arrangements for the past 2 months: Single Family Home                                      Prior Living Arrangements/Services Living arrangements for the past 2 months: Single Family Home Lives with:: Spouse Patient language and need for interpreter reviewed:: Yes Do you feel safe going back to the place where you live?: Yes      Need for Family Participation in Patient Care: Yes (Comment) Care giver support system in place?: Yes (comment) Current home services: DME Criminal Activity/Legal Involvement Pertinent to Current Situation/Hospitalization: No - Comment as needed  Activities of Daily Living Home Assistive Devices/Equipment: None ADL Screening (condition at time of admission) Patient's cognitive ability adequate to safely complete daily  activities?: No Is the patient deaf or have difficulty hearing?: No Does the patient have difficulty seeing, even when wearing glasses/contacts?: No Does the patient have difficulty concentrating, remembering, or making decisions?: No Patient able to express need for assistance with ADLs?: Yes Does the patient have difficulty dressing or bathing?: No Independently performs ADLs?: Yes (appropriate for developmental age) Does the patient have difficulty walking or climbing stairs?: No Weakness of Legs: Both Weakness of Arms/Hands: None  Permission Sought/Granted Permission sought to share information with : Case Manager, Customer service manager, Family Supports Permission granted to share information with : Yes, Verbal Permission Granted              Emotional Assessment Appearance:: Appears stated age Attitude/Demeanor/Rapport: Engaged, Gracious Affect (typically observed): Accepting, Appropriate, Calm, Hopeful Orientation: : Oriented to Self, Oriented to Place, Oriented to  Time, Oriented to Situation Alcohol / Substance Use: Not Applicable Psych Involvement: No (comment)  Admission diagnosis:  Diplopia [H53.2] Influenza A [J10.1] Hyperglycemia [R73.9] Leg weakness [R29.898] NSTEMI (non-ST elevated myocardial infarction) ([place]) [I21.4] AKI (acute kidney injury) ([place]) [N17.9] Patient Active Problem List   Diagnosis Date Noted   Lower extremity weakness 03/01/2021   Troponin level elevated 03/01/2021   Diplopia 03/01/2021   Leg weakness 03/01/2021   Influenza A 02/28/2021   Hyponatremia 02/28/2021   Bilateral lower extremity edema 02/21/2021   Acute supraglottitis with epiglottitis 02/05/2021   Epiglottitis 02/05/2021  Reactive airway disease 01/20/2021   Diabetic peripheral neuropathy ([place]) 12/19/2020   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   Primary insomnia 11/02/2020   Irritation of eyelid 07/26/2020   S/P aortic valve replacement with bioprosthetic  valve 05/04/2020   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   PAD (peripheral artery disease) (HCC)    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[place]) 04/07/2019   Low back pain 02/03/2019   Scoliosis deformity of spine 02/03/2019   Osteoarthritis of hip 11/17/2018   Claudication ([place])    Chronic headaches 11/12/2017   Degeneration of lumbar intervertebral disc 06/13/2017   Hyperkalemia 06/03/2017   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[place])    Mass of left adrenal gland ([place]) 04/19/2017   Spinal stenosis of lumbar region 10/20/2014   Hyperlipidemia 03/16/2013   Dense breasts 12/22/2012   Anxiety state 04/16/2012   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/16/2012   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   Osteoarthritis of knee 04/16/2012   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 female breast ([place]) 12/20/2010   History of breast cancer 12/05/2010   Depression 09/14/2008   GERD 09/14/2008   Type 2 diabetes mellitus with diabetic neuropathy, without long-term current use of insulin ([place]) 05/20/2008   Essential hypertension 04/17/2007   Coronary atherosclerosis 04/17/2007   Hypothyroidism 04/03/2007   PCP:  [name] Salter, MD Pharmacy:   [facility] [phone] [name], [address] AT [facility] OF [address] [place] [address] Phone: [phone] Fax: [phone]     Social Determinants of Health (SDOH) Interventions    Readmission Risk Interventions Readmission Risk Prevention Plan 02/07/2021  Transportation Screening Complete  PCP or Specialist Appt within 3-5 Days Complete  HRI or Home Care Consult Complete  Social Work Consult for [place] Planning/Counseling Complete  Palliative Care Screening Not Applicable  Medication Review Press photographer) Complete  Some recent data might be hidden

## 2021-03-03 NOTE — Progress Notes (Signed)
Progress Note    [name]  CBJ:[id number] DOB: September 14, 1940  DOA: 02/28/2021 PCP: [name], MD    Brief Narrative:     Medical records reviewed and are as summarized below:  [name] is an 81 y.o. female with medical history significant of DM2, HTN HLD, CAD sp CABG recent Covid.  Admitted for  elevated troponin and leg weakness.  Found to have worsening ABIs and non-healing foot wounds.  S/p repeat angiogram and plan is for amputation on Monday   Assessment/Plan:   Principal Problem:   Influenza A Active Problems:   Hypothyroidism   Type 2 diabetes mellitus with diabetic neuropathy, without long-term current use of insulin (HCC)   Essential hypertension   Coronary atherosclerosis   GERD   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 female breast ([place])   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HCC)   PAD (peripheral artery disease) (HCC)   Hyperlipidemia   Diabetic peripheral neuropathy (HCC)   Bilateral lower extremity edema   Hyponatremia   Lower extremity weakness   Troponin level elevated   Diplopia   Leg weakness   Diplopia/LE weakness/reported "clumsiness"  -MRI brain negative -resolved symptoms -suspect related to [facility]  PAD (peripheral artery disease) (HCC) Bilateral claudication  -ABI abnormal -vascular consult- plan for amputation on Monday due to severely disadvantaged outflow into the foot.     Influenza A Pt with recent infection - do not think active infection   Hypothyroidism -  continue home medications at current dose TSH slightly elevated   Type 2 diabetes mellitus with diabetic neuropathy, without long-term current use of insulin (HCC)  - SSI  continue lantus  Essential hypertension Continue metoprolol   Bilateral lower extremity edema Possible side effect of lyrica Negative for DVT   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 female breast ([place]) Remote stable   GERD Continue PPI   CKD stage  4 due to type 2 diabetes mellitus ([place])  -chronic avoid nephrotoxic medications such as NSAIDs, Vanco/Zosyn combo,  avoid hypotension, continue to follow renal function   Hyperlipidemia Stable cont home meds   Lower extremity weakness Unclear etiology, given recent viral illness GBS is on differential but unlikley given Reflexes intact and patient reports improvement overnight -MRI lumbar spine done -Other differential includes reaction to lyrica will hold -PT/OT assessment   Diabetic peripheral neuropathy (HCC) Hold lyrica (per cards notes patient's issues started when lyrica strarted 2/4)   Hyponatremia -resolved   Elevated troponin/NSTEMI  -no chest pain no EKG changes in the setting of  chronic kidney disease likely due to demand ischemia and poor clearance, monitor on telemetry and cycle cardiac enzymes to trend. -heparin gtt x 48 hours -cards consult    Coronary atherosclerosis  - chronic, continue aspirin and beta blocker -cards following        Family Communication/Anticipated D/C date and plan/Code Status   DVT prophylaxis: heparin gtt x 48 hours Code Status: DNR   Disposition Plan: Status is: inpt The patient will require care spanning > 2 midnights and should be moved to inpatient because: needs MRI brain, PT/OT eval           Medical Consultants:   Cards vascular  Subjective:   NPO for procedure   Objective:    Vitals:   03/03/21 1358 03/03/21 1413 03/03/21 1418 03/03/21 1423  BP: (!) 173/75 (!) 205/109 (!) 169/80 (!) 149/82  Pulse: (!) 109 (!) 113 (!) 116 Marland Kitchen)  117  Resp: (!) 22 (!) 36 (!) 22 (!) 25  Temp:      TempSrc:      SpO2: 98% 98% 98%   Weight:        Intake/Output Summary (Last 24 hours) at 03/03/2021 1446 Last data filed at 03/03/2021 0804 Gross per 24 hour  Intake 805.49 ml  Output --  Net 805.49 ml   Filed Weights   02/28/21 1900  Weight: 66.7 kg    Exam:    General: Appearance:     Overweight female in no  acute distress     Lungs:     respirations unlabored  Heart:    Tachycardic.   MS:   All extremities are intact.    Neurologic:   Awake, alert       Data Reviewed:   I have personally reviewed following labs and imaging studies:  Labs: Labs show the following:   Basic Metabolic Panel: Recent Labs  Lab 02/28/21 1128 03/01/21 0128 03/01/21 0423 03/02/21 0042 03/03/21 0333  NA 131* 135 136 135 134*  K 5.0 3.5 3.4* 4.0 4.2  CL 96* 102 101 101 99  CO2 24 21* 24 21* 23  GLUCOSE 424* 275* 183* 185* 278*  BUN 37* 32* 30* 28* 26*  CREATININE 1.95* 1.76* 1.70* 1.58* 1.62*  CALCIUM 10.2 9.1 9.1 8.7* 8.7*  MG  --  1.7 1.8  --   --   PHOS  --  3.7 3.9  --   --    GFR Estimated Creatinine Clearance: 26.3 mL/min (A) (by C-G formula based on SCr of 1.62 mg/dL (H)). Liver Function Tests: Recent Labs  Lab 03/01/21 0128 03/01/21 0423  AST 18 19  ALT 18 18  ALKPHOS 73 66  BILITOT 0.7 0.5  PROT 5.6* 5.8*  ALBUMIN 2.7* 2.8*   No results for input(s): LIPASE, AMYLASE in the last 168 hours. No results for input(s): AMMONIA in the last 168 hours. Coagulation profile No results for input(s): INR, PROTIME in the last 168 hours.  CBC: Recent Labs  Lab 02/28/21 1128 03/01/21 0128 03/01/21 0423 03/02/21 0640 03/03/21 0333  WBC 12.9* 10.8* 9.2 8.1 7.2  NEUTROABS  --  6.1 4.7  --   --   HGB 12.7 10.7* 11.6* 12.7 11.9*  HCT 40.0 32.6* 35.8* 37.5 36.0  MCV 91.5 92.9 92.5 89.7 91.1  PLT 176 154 139* 174 187   Cardiac Enzymes: Recent Labs  Lab 03/01/21 0128  CKTOTAL 64   BNP (last 3 results) Recent Labs    09/01/20 1023 10/26/20 1137 02/21/21 1209  PROBNP 2,950* 2,308* 200.0*   CBG: Recent Labs  Lab 03/02/21 1643 03/02/21 2042 03/03/21 0723 03/03/21 1121 03/03/21 1443  GLUCAP 167* 253* 251* 272* 183*   D-Dimer: Recent Labs    03/01/21 0128  DDIMER 1.48*   Hgb A1c: Recent Labs    03/01/21 0128  HGBA1C 11.6*   Lipid Profile: No results for  input(s): CHOL, HDL, LDLCALC, TRIG, CHOLHDL, LDLDIRECT in the last 72 hours. Thyroid function studies: Recent Labs    03/01/21 0423  TSH 5.599*   Anemia work up: Recent Labs    03/02/21 0042  VITAMINB12 422   Sepsis Labs: Recent Labs  Lab 03/01/21 0128 03/01/21 0423 03/02/21 0640 03/03/21 0333  WBC 10.8* 9.2 8.1 7.2    Microbiology Recent Results (from the past 240 hour(s))  Resp Panel by RT-PCR (Flu A&B, Covid) Nasopharyngeal Swab     Status: Abnormal   Collection Time:  02/28/21  6:41 PM   Specimen: Nasopharyngeal Swab; Nasopharyngeal(NP) swabs in vial transport medium  Result Value Ref Range Status   SARS Coronavirus 2 by RT PCR NEGATIVE NEGATIVE Final    Comment: (NOTE) SARS-CoV-2 target nucleic acids are NOT DETECTED.  The SARS-CoV-2 RNA is generally detectable in upper respiratory specimens during the acute phase of infection. The lowest concentration of SARS-CoV-2 viral copies this assay can detect is 138 copies/mL. A negative result does not preclude SARS-Cov-2 infection and should not be used as the sole basis for treatment or other patient management decisions. A negative result may occur with  improper specimen collection/handling, submission of specimen other than nasopharyngeal swab, presence of viral mutation(s) within the areas targeted by this assay, and inadequate number of viral copies(<138 copies/mL). A negative result must be combined with clinical observations, patient history, and epidemiological information. The expected result is Negative.  Fact Sheet for Patients:  EntrepreneurPulse.com.au  Fact Sheet for Healthcare Providers:  IncredibleEmployment.be  This test is no t yet approved or cleared by the [place] FDA and  has been authorized for detection and/or diagnosis of SARS-CoV-2 by FDA under an Emergency Use Authorization (EUA). This EUA will remain  in effect (meaning this test can be used) for  the duration of the COVID-19 declaration under Section 564(b)(1) of the Act, 21 U.S.C.section 360bbb-3(b)(1), unless the authorization is terminated  or revoked sooner.       Influenza A by PCR POSITIVE (A) NEGATIVE Final   Influenza B by PCR NEGATIVE NEGATIVE Final    Comment: (NOTE) The Xpert Xpress SARS-CoV-2/FLU/RSV plus assay is intended as an aid in the diagnosis of influenza from Nasopharyngeal swab specimens and should not be used as a sole basis for treatment. Nasal washings and aspirates are unacceptable for Xpert Xpress SARS-CoV-2/FLU/RSV testing.  Fact Sheet for Patients: EntrepreneurPulse.com.au  Fact Sheet for Healthcare Providers: IncredibleEmployment.be  This test is not yet approved or cleared by the [place] FDA and has been authorized for detection and/or diagnosis of SARS-CoV-2 by FDA under an Emergency Use Authorization (EUA). This EUA will remain in effect (meaning this test can be used) for the duration of the COVID-19 declaration under Section 564(b)(1) of the Act, 21 U.S.C. section 360bbb-3(b)(1), unless the authorization is terminated or revoked.  Performed at [facility], [place] [address] [place], [place] [address]     Procedures and diagnostic studies:  MR BRAIN WO CONTRAST  Result Date: 03/01/2021 CLINICAL DATA:  Diplopia EXAM: MRI HEAD WITHOUT CONTRAST TECHNIQUE: Multiplanar, multiecho pulse sequences of the brain and surrounding structures were obtained without intravenous contrast. COMPARISON:  2019 FINDINGS: Brain: There is no acute infarction or intracranial hemorrhage. There is no intracranial mass, mass effect, or edema. There is no hydrocephalus or extra-axial fluid collection. Prominence of ventricles and sulci reflects parenchymal volume loss. Patchy T2 hyperintensity in the supratentorial white matter is nonspecific but may reflect mild chronic microvascular ischemic changes. Vascular: Major  vessel flow voids at the skull base are preserved. Skull and upper cervical spine: Normal marrow signal is preserved. Sinuses/Orbits: Paranasal sinuses are aerated. Orbits are unremarkable. Other: Sella is unremarkable.  Mastoid air cells are clear. IMPRESSION: No evidence of recent infarction, hemorrhage, or mass. Mild chronic microvascular ischemic changes. Electronically Signed   By: Macy Mis M.D.   On: 03/01/2021 16:15    Medications:    [MAR Hold] aspirin EC  81 mg Oral q AM   [MAR Hold] bacitracin   Topical BID   [  MAR Hold] feeding supplement  237 mL Oral BID BM   [MAR Hold] insulin aspart  0-15 Units Subcutaneous TID WC   [MAR Hold] insulin aspart  0-5 Units Subcutaneous QHS   [MAR Hold] insulin glargine-yfgn  10 Units Subcutaneous QHS   [MAR Hold] levothyroxine  100 mcg Oral Q0600   [MAR Hold] metoprolol tartrate  12.5 mg Oral BID   [MAR Hold] multivitamin with minerals  1 tablet Oral Daily   [MAR Hold] potassium chloride  40 mEq Oral Once   [MAR Hold] sodium chloride flush  3 mL Intravenous Q12H   Continuous Infusions:  [MAR Hold] sodium chloride     sodium chloride Stopped (03/03/21 1310)   heparin Stopped (03/03/21 1310)     LOS: 2 days   Geradine Girt  [facility]   How to contact the [facility] Attending or Consulting provider [place] or covering provider during after hours [place], for this patient?  Check the care team in [facility] and look for a) attending/consulting TR[facility] provider listed and b) the [facility] team listed Log into [email or web address] and use [place]'s universal password to access. If you do not have the password, please contact the hospital operator. Locate the [facility] provider you are looking for under [facility] and page to a number that you can be directly reached. If you still have difficulty reaching the provider, please page the [facility] (Director on Call) for the Hospitalists listed on amion for assistance.  03/03/2021, 2:46 PM

## 2021-03-03 NOTE — Progress Notes (Signed)
Inpatient Diabetes Program Recommendations  AACE/ADA: New Consensus Statement on Inpatient Glycemic Control (2015)  Target Ranges:  Prepandial:   less than 140 mg/dL      Peak postprandial:   less than 180 mg/dL (1-2 hours)      Critically ill patients:  140 - 180 mg/dL   Lab Results  Component Value Date   GLUCAP 251 (H) 03/03/2021   HGBA1C 11.6 (H) 03/01/2021    Review of Glycemic Control  Latest Reference Range & Units 03/02/21 16:43 03/02/21 20:42 03/03/21 07:23  Glucose-Capillary 70 - 99 mg/dL 167 (H) 253 (H) 251 (H)  (H): Data is abnormally high Diabetes history: DM 2 Outpatient Diabetes medications: Glipizide 10 mg Daily, Lantus 10 units, Jardiance 10 mg Daily Current orders for Inpatient glycemic control:  Semglee 10 units Novolog 0-15 units TID & HS  Inpatient Diabetes Program Recommendations:    Consider slightly increasing Semglee to 12 units QD  Thanks, Bronson Curb, MSN, RNC-OB Diabetes Coordinator [phone] (8a-5p)

## 2021-03-03 NOTE — Progress Notes (Signed)
Pt received from cath lab. VSS. R groin level 0. Pt and family oriented to room and unit. Call light in reach.  Clyde Canterbury, RN

## 2021-03-03 NOTE — Progress Notes (Signed)
PT Cancellation Note  Patient Details Name: MOMINA HUNTON MRN: [id number] DOB: 1940/05/20   Cancelled Treatment:    Reason Eval/Treat Not Completed: Patient at procedure or test/unavailable Off unit in cath lab. Will re-attempt as time allows.   Katheleen Stella A. Gilford Rile PT, DPT Acute Rehabilitation Services Pager [phone] Office [phone]    Linna Hoff 03/03/2021, 2:12 PM

## 2021-03-03 NOTE — Progress Notes (Signed)
72fr sheath aspirated and removed from right femoral artery. Manual pressure applied for 20 minutes. Site level 0.  No S+S of hematoma. Tegaderm dressing applied, bedrest instructions given.   Distal pulses absent.   Bedrest begins at 15:15:00

## 2021-03-03 NOTE — Progress Notes (Incomplete)
Called report to [facility] 4E

## 2021-03-03 NOTE — Op Note (Signed)
DATE OF SERVICE: 03/03/2021  PATIENT:  [name]  81 y.o. female  PRE-OPERATIVE DIAGNOSIS:  Atherosclerosis of native arteries of left lower extremity causing ulceration  POST-OPERATIVE DIAGNOSIS:  Same  PROCEDURE:   1) US guided right common femoral artery access 2) Aortogram 3) Left lower extremity angiogram with second order cannulation (56mL total contrast) 4) Conscious sedation (35 minutes)  SURGEON:  [name]. Stanford Breed, MD  ASSISTANT: none  ANESTHESIA:   local and IV sedation  ESTIMATED BLOOD LOSS: minimal  LOCAL MEDICATIONS USED:  LIDOCAINE   COUNTS: confirmed correct.  PATIENT DISPOSITION:  PACU - hemodynamically stable.   Delay start of Pharmacological VTE agent (>24hrs) due to surgical blood loss or risk of bleeding: no  INDICATION FOR PROCEDURE: [name] is a 81 y.o. 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  She had an angiogram about 3 years ago which showed multilevel stenosis; at that time she was having claudication symptoms, and intervention was not indicated.  Since, her ABI has deteriorated.  She has developed a new ulcer on her left great toe.  After careful discussion of risks, benefits, and alternatives the patient was offered angiography. The patient understood and wished to proceed.  OPERATIVE FINDINGS:  Terminal aorta and iliac arteries: Diminutive, heavily diseased, but widely patent  Left lower extremity: Diminutive, diffuse disease throughout the leg Common femoral artery: Widely patent Profunda femoris artery: Widely patent Superficial femoral artery: Occludes in mid to distal thigh. Popliteal artery: Reconstitutes above the knee.  Diffusely diseased popliteal artery. Anterior tibial artery: Occluded Tibioperoneal trunk: Diffusely diseased, severe stenosis just proximal intake of the peroneal artery (90%) Peroneal artery: Diffusely diseased, multiple areas of severe stenosis throughout  (90+%) Posterior tibial artery: Occluded Pedal circulation: Does not fill via collateralization from the peroneal artery.  No pedal outflow observed.  DESCRIPTION OF PROCEDURE: After identification of the patient in the pre-operative holding area, the patient was transferred to the operating room. The patient was positioned supine on the operating room table. Anesthesia was induced. The groins was prepped and draped in standard fashion. A surgical pause was performed confirming correct patient, procedure, and operative location.  The right groin was anesthetized with subcutaneous injection of 1% lidocaine. Using ultrasound guidance, the right common femoral artery was accessed with micropuncture technique. Fluoroscopy was used to confirm cannulation over the femoral head. The 41F sheath was upsized to 84F.   A Benson wire was advanced into the distal aorta. Over the wire an omni flush catheter was advanced to the level of L2. Aortogram was performed - see above for details.   The left common iliac artery was selected with a wire guidewire. The wire was advanced into the common femoral artery. Over the wire the omni flush catheter was advanced into the external iliac artery. Selective angiography was performed - see above for details.   All endovascular equipment was removed.  The sheath was left in place, to be removed in the recovery area.  Conscious sedation was administered with the use of IV fentanyl and midazolam under continuous physician and nurse monitoring.  Heart rate, blood pressure, and oxygen saturation were continuously monitored.  Total sedation time was 35 minutes  Upon completion of the case instrument and sharps counts were confirmed correct. The patient was transferred to the PACU in good condition. I was present for all portions of the procedure.  PLAN: No options for revascularization given severely disadvantaged outflow into the foot.  Her 2  options from a vascular standpoint  are transition to palliative care, or palliative left above-knee amputation.  She desires an amputation.  We will plan to do this on Monday.  [name]. Stanford Breed, MD [facility] Phone Number: [phone] 03/03/2021 2:25 PM

## 2021-03-03 NOTE — Progress Notes (Signed)
[facility] OF [place] PROGRESS NOTE  ASSESSMENT / PLAN: [name] is a 81 y.o. 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  Plan angiogram today.  SUBJECTIVE: No questions.  Reviewed plan for today.  OBJECTIVE: BP (!) 149/81 (BP Location: Right Arm)    Pulse (!) 109    Temp 98.5 F (36.9 C)    Resp 19    Wt 66.7 kg    SpO2 98%    BMI 26.05 kg/m   Intake/Output Summary (Last 24 hours) at 03/03/2021 1423 Last data filed at 03/03/2021 0804 Gross per 24 hour  Intake 805.49 ml  Output --  Net 805.49 ml    No acute distress No palpable pulses in bilateral pedal arteries.  Unchanged appearance of left toe ulcer  CBC Latest Ref Rng & Units 03/03/2021 03/02/2021 03/01/2021  WBC 4.0 - 10.5 K/uL 7.2 8.1 9.2  Hemoglobin 12.0 - 15.0 g/dL 11.9(L) 12.7 11.6(L)  Hematocrit 36.0 - 46.0 % 36.0 37.5 35.8(L)  Platelets 150 - 400 K/uL 187 174 139(L)     CMP Latest Ref Rng & Units 03/03/2021 03/02/2021 03/01/2021  Glucose 70 - 99 mg/dL 278(H) 185(H) 183(H)  BUN 8 - 23 mg/dL 26(H) 28(H) 30(H)  Creatinine 0.44 - 1.00 mg/dL 1.62(H) 1.58(H) 1.70(H)  Sodium 135 - 145 mmol/L 134(L) 135 136  Potassium 3.5 - 5.1 mmol/L 4.2 4.0 3.4(L)  Chloride 98 - 111 mmol/L 99 101 101  CO2 22 - 32 mmol/L 23 21(L) 24  Calcium 8.9 - 10.3 mg/dL 8.7(L) 8.7(L) 9.1  Total Protein 6.5 - 8.1 g/dL - - 5.8(L)  Total Bilirubin 0.3 - 1.2 mg/dL - - 0.5  Alkaline Phos 38 - 126 U/L - - 66  AST 15 - 41 U/L - - 19  ALT 0 - 44 U/L - - 18    Estimated Creatinine Clearance: 26.3 mL/min (A) (by C-G formula based on SCr of 1.62 mg/dL (H)).  [name]. [name] Breed, MD [facility] of [facility] Phone Number: [phone] 03/03/2021 2:23 PM

## 2021-03-04 ENCOUNTER — Encounter (HOSPITAL_COMMUNITY): Payer: Self-pay | Admitting: Internal Medicine

## 2021-03-04 DIAGNOSIS — J101 Influenza due to other identified influenza virus with other respiratory manifestations: Secondary | ICD-10-CM | POA: Diagnosis not present

## 2021-03-04 LAB — GLUCOSE, CAPILLARY
Glucose-Capillary: 247 mg/dL — ABNORMAL HIGH (ref 70–99)
Glucose-Capillary: 236 mg/dL — ABNORMAL HIGH (ref 70–99)
Glucose-Capillary: 393 mg/dL — ABNORMAL HIGH (ref 70–99)
Glucose-Capillary: 309 mg/dL — ABNORMAL HIGH (ref 70–99)

## 2021-03-04 LAB — LIPID PANEL
Cholesterol: 104 mg/dL (ref 0–200)
HDL: 38 mg/dL — ABNORMAL LOW (ref >40)
LDL Cholesterol: 35 mg/dL (ref 0–99)
Total CHOL/HDL Ratio: 2.7 RATIO
Triglycerides: 154 mg/dL — ABNORMAL HIGH (ref <150)
VLDL: 31 mg/dL (ref 0–40)

## 2021-03-04 LAB — CBC
HCT: 40.5 % (ref 36.0–46.0)
Hemoglobin: 13.1 g/dL (ref 12.0–15.0)
MCH: 30 pg (ref 26.0–34.0)
MCHC: 32.3 g/dL (ref 30.0–36.0)
MCV: 92.9 fL (ref 80.0–100.0)
Platelets: 203 10*3/uL (ref 150–400)
RBC: 4.36 MIL/uL (ref 3.87–5.11)
RDW: 15.2 % (ref 11.5–15.5)
WBC: 11.8 10*3/uL — ABNORMAL HIGH (ref 4.0–10.5)
nRBC: 0 % (ref 0.0–0.2)

## 2021-03-04 LAB — BASIC METABOLIC PANEL
Anion gap: 13 (ref 5–15)
BUN: 23 mg/dL (ref 8–23)
CO2: 21 mmol/L — ABNORMAL LOW (ref 22–32)
Calcium: 9.2 mg/dL (ref 8.9–10.3)
Chloride: 102 mmol/L (ref 98–111)
Creatinine, Ser: 1.72 mg/dL — ABNORMAL HIGH (ref 0.44–1.00)
GFR, Estimated: 30 mL/min — ABNORMAL LOW (ref >60)
Glucose, Bld: 217 mg/dL — ABNORMAL HIGH (ref 70–99)
Potassium: 4.4 mmol/L (ref 3.5–5.1)
Sodium: 136 mmol/L (ref 135–145)

## 2021-03-04 MED ORDER — HEPARIN SODIUM (PORCINE) 5000 UNIT/ML IJ SOLN
5000.0000 [IU] | Freq: Three times a day (TID) | INTRAMUSCULAR | Status: DC
  Administered 2021-03-04 – 2021-03-05 (×5): 5000 [IU] via SUBCUTANEOUS
  Filled 2021-03-04 (×5): qty 1

## 2021-03-04 MED ORDER — PANTOPRAZOLE SODIUM 40 MG PO TBEC
40.0000 mg | DELAYED_RELEASE_TABLET | Freq: Every day | ORAL | Status: DC
  Administered 2021-03-04 – 2021-03-13 (×9): 40 mg via ORAL
  Filled 2021-03-04 (×9): qty 1

## 2021-03-04 MED ORDER — CALCIUM CARBONATE ANTACID 500 MG PO CHEW
1.0000 | CHEWABLE_TABLET | Freq: Three times a day (TID) | ORAL | Status: DC | PRN
  Administered 2021-03-05 (×2): 200 mg via ORAL
  Filled 2021-03-04 (×2): qty 1

## 2021-03-04 MED ORDER — FLEET ENEMA 7-19 GM/118ML RE ENEM
1.0000 | ENEMA | Freq: Once | RECTAL | Status: AC | PRN
  Administered 2021-03-04: 1 via RECTAL
  Filled 2021-03-04: qty 1

## 2021-03-04 MED ORDER — METOPROLOL TARTRATE 25 MG PO TABS
25.0000 mg | ORAL_TABLET | Freq: Two times a day (BID) | ORAL | Status: DC
  Administered 2021-03-04 – 2021-03-11 (×15): 25 mg via ORAL
  Filled 2021-03-04 (×15): qty 1

## 2021-03-04 NOTE — Progress Notes (Signed)
[facility] OF [place] PROGRESS NOTE  ASSESSMENT / PLAN: [name] is a 81 y.o. 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  Angiogram performed 03/03/2021 shows no options for revascularization.  I offered the patient a left above-knee amputation.  She would like to think about this some more before committing to this plan.  We will check in with her tomorrow.  Please call us for any questions or concerns.  SUBJECTIVE: No complaints.  Reviewed angiogram findings with her again in detail.  OBJECTIVE: BP (!) 158/89 (BP Location: Right Arm)    Pulse (!) 112    Temp 98.5 F (36.9 C) (Oral)    Resp 20    Wt 66.7 kg    SpO2 99%    BMI 26.05 kg/m   Intake/Output Summary (Last 24 hours) at 03/04/2021 0823 Last data filed at 03/04/2021 0357 Gross per 24 hour  Intake 669.22 ml  Output 0 ml  Net 669.22 ml    Constitutional: Elderly.  Chronically ill-appearing.  In no acute distress Cardiac: Regular rate and rhythm. Pulmonary: Unlabored breathing Abdomen: Soft abdomen Vascular: Right femoral access soft without evidence of hematoma.  CBC Latest Ref Rng & Units 03/04/2021 03/03/2021 03/03/2021  WBC 4.0 - 10.5 K/uL 11.8(H) 9.9 7.2  Hemoglobin 12.0 - 15.0 g/dL 13.1 12.1 11.9(L)  Hematocrit 36.0 - 46.0 % 40.5 38.1 36.0  Platelets 150 - 400 K/uL 203 191 187     CMP Latest Ref Rng & Units 03/04/2021 03/03/2021 03/03/2021  Glucose 70 - 99 mg/dL 217(H) - 278(H)  BUN 8 - 23 mg/dL 23 - 26(H)  Creatinine 0.44 - 1.00 mg/dL 1.72(H) 1.56(H) 1.62(H)  Sodium 135 - 145 mmol/L 136 - 134(L)  Potassium 3.5 - 5.1 mmol/L 4.4 - 4.2  Chloride 98 - 111 mmol/L 102 - 99  CO2 22 - 32 mmol/L 21(L) - 23  Calcium 8.9 - 10.3 mg/dL 9.2 - 8.7(L)  Total Protein 6.5 - 8.1 g/dL - - -  Total Bilirubin 0.3 - 1.2 mg/dL - - -  Alkaline Phos 38 - 126 U/L - - -  AST 15 - 41 U/L - - -  ALT 0 - 44 U/L - - -    Estimated Creatinine Clearance: 24.7  mL/min (A) (by C-G formula based on SCr of 1.72 mg/dL (H)).  [name]. Stanford Breed, MD [facility] Special[facility] of [facility] Phone Number: [phone] 03/04/2021 8:23 AM

## 2021-03-04 NOTE — Progress Notes (Signed)
Dressings changed on bilateral heels and prevalon boots applied per order

## 2021-03-04 NOTE — Progress Notes (Signed)
Labetalol 10 mg IV given for bp SBP >160. Rechecked bp = 118/87.   Lavenia Atlas, RN

## 2021-03-04 NOTE — Progress Notes (Signed)
Progress Note    [name]  EGB:[id number] DOB: October 28, 1940  DOA: 02/28/2021 PCP: [name], MD    Brief Narrative:     Medical records reviewed and are as summarized below:  [name] is an 80 y.o. female with medical history significant of DM2, HTN HLD, CAD sp CABG recent Covid.  Admitted for  elevated troponin and leg weakness.  Found to have worsening ABIs and non-healing foot wounds.  S/p repeat angiogram and plan is for amputation on Monday if she agrees.     Assessment/Plan:   Principal Problem:   Influenza A Active Problems:   Hypothyroidism   Type 2 diabetes mellitus with diabetic neuropathy, without long-term current use of insulin (HCC)   Essential hypertension   Coronary atherosclerosis   GERD   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 female breast ([place])   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HCC)   PAD (peripheral artery disease) (HCC)   Hyperlipidemia   Diabetic peripheral neuropathy (HCC)   Bilateral lower extremity edema   Hyponatremia   Lower extremity weakness   Troponin level elevated   Diplopia   Leg weakness   Diplopia/LE weakness/reported "clumsiness"  -MRI brain negative -resolved symptoms -suspect related to [facility]  PAD (peripheral artery disease) (HCC) Bilateral claudication  -ABI abnormal -vascular consult- plan for amputation on Monday if patient agreeable due to severely disadvantaged outflow into the foot.     Influenza A Pt with recent infection - do not think active infection   Hypothyroidism -  continue home medications at current dose TSH slightly elevated   Type 2 diabetes mellitus with diabetic neuropathy, without long-term current use of insulin (HCC)  - SSI  continue lantus  Essential hypertension Continue metoprolol   Bilateral lower extremity edema Possible side effect of lyrica Negative for DVT   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 female breast ([place]) Remote  stable   GERD Continue PPI   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[place])  -chronic avoid nephrotoxic medications such as NSAIDs, Vanco/Zosyn combo,  avoid hypotension, continue to follow renal function   Hyperlipidemia Stable cont home meds   Lower extremity weakness Unclear etiology, given recent viral illness GBS is on differential but unlikley given Reflexes intact and patient reports improvement overnight -MRI lumbar spine done -Other differential includes reaction to lyrica will hold -PT/OT assessment   Diabetic peripheral neuropathy (HCC) Hold lyrica (per cards notes patient's issues started when lyrica strarted 2/4)   Hyponatremia -resolved   Elevated troponin/NSTEMI  -no chest pain no EKG changes in the setting of  chronic kidney disease likely due to demand ischemia and poor clearance, monitor on telemetry and cycle cardiac enzymes to trend. -heparin gtt x 48 hours- plan to d/c -cards consult    Coronary atherosclerosis  - chronic, continue aspirin and beta blocker -cards following        Family Communication/Anticipated D/C date and plan/Code Status   DVT prophylaxis: heparin gtt x 48 hours now heparin Code Status: DNR   Disposition Plan: Status is: inpt The patient will require care spanning > 2 midnights and should be moved to inpatient because: needs surgery           Medical Consultants:   Cards vascular  Subjective:   Says she is anxious  Objective:    Vitals:   03/04/21 0741 03/04/21 0931 03/04/21 1036 03/04/21 1135  BP: (!) 158/89 (!) 179/89 (!) 161/76  118/87  Pulse: (!) 112 (!) 114 96 (!) 104  Resp: 20 18 14 17   Temp: 98.5 F (36.9 C) 98.6 F (37 C) 98 F (36.7 C) 98.3 F (36.8 C)  TempSrc: Oral Oral Oral Oral  SpO2: 99% 99% 96% 96%  Weight:        Intake/Output Summary (Last 24 hours) at 03/04/2021 1306 Last data filed at 03/04/2021 7035 Gross per 24 hour  Intake 672.22 ml  Output 0 ml  Net 672.22 ml   Filed  Weights   02/28/21 1900  Weight: 66.7 kg    Exam:    General: Appearance:     Overweight female in no acute distress     Lungs:     respirations unlabored  Heart:    Tachycardic.   MS:   All extremities are intact.    Neurologic:   Awake, alert         Data Reviewed:   I have personally reviewed following labs and imaging studies:  Labs: Labs show the following:   Basic Metabolic Panel: Recent Labs  Lab 03/01/21 0128 03/01/21 0423 03/02/21 0042 03/03/21 0333 03/03/21 1739 03/04/21 0134  NA 135 136 135 134*  --  136  K 3.5 3.4* 4.0 4.2  --  4.4  CL 102 101 101 99  --  102  CO2 21* 24 21* 23  --  21*  GLUCOSE 275* 183* 185* 278*  --  217*  BUN 32* 30* 28* 26*  --  23  CREATININE 1.76* 1.70* 1.58* 1.62* 1.56* 1.72*  CALCIUM 9.1 9.1 8.7* 8.7*  --  9.2  MG 1.7 1.8  --   --   --   --   PHOS 3.7 3.9  --   --   --   --    GFR Estimated Creatinine Clearance: 24.7 mL/min (A) (by C-G formula based on SCr of 1.72 mg/dL (H)). Liver Function Tests: Recent Labs  Lab 03/01/21 0128 03/01/21 0423  AST 18 19  ALT 18 18  ALKPHOS 73 66  BILITOT 0.7 0.5  PROT 5.6* 5.8*  ALBUMIN 2.7* 2.8*   No results for input(s): LIPASE, AMYLASE in the last 168 hours. No results for input(s): AMMONIA in the last 168 hours. Coagulation profile No results for input(s): INR, PROTIME in the last 168 hours.  CBC: Recent Labs  Lab 03/01/21 0128 03/01/21 0423 03/02/21 0640 03/03/21 0333 03/03/21 1739 03/04/21 0134  WBC 10.8* 9.2 8.1 7.2 9.9 11.8*  NEUTROABS 6.1 4.7  --   --   --   --   HGB 10.7* 11.6* 12.7 11.9* 12.1 13.1  HCT 32.6* 35.8* 37.5 36.0 38.1 40.5  MCV 92.9 92.5 89.7 91.1 92.3 92.9  PLT 154 139* 174 187 191 203   Cardiac Enzymes: Recent Labs  Lab 03/01/21 0128  CKTOTAL 64   BNP (last 3 results) Recent Labs    09/01/20 1023 10/26/20 1137 02/21/21 1209  PROBNP 2,950* 2,308* 200.0*   CBG: Recent Labs  Lab 03/03/21 1443 03/03/21 1725 03/03/21 2118  03/04/21 0616 03/04/21 1033  GLUCAP 183* 167* 267* 247* 236*   D-Dimer: No results for input(s): DDIMER in the last 72 hours.  Hgb A1c: No results for input(s): HGBA1C in the last 72 hours.  Lipid Profile: Recent Labs    03/04/21 0134  CHOL 104  HDL 38*  LDLCALC 35  TRIG 154*  CHOLHDL 2.7   Thyroid function studies: No results for input(s): TSH, T4TOTAL, T3FREE, THYROIDAB in the last  72 hours.  Invalid input(s): FREET3  Anemia work up: Recent Labs    03/02/21 0042  VITAMINB12 422   Sepsis Labs: Recent Labs  Lab 03/02/21 0640 03/03/21 0333 03/03/21 1739 03/04/21 0134  WBC 8.1 7.2 9.9 11.8*    Microbiology Recent Results (from the past 240 hour(s))  Resp Panel by RT-PCR (Flu A&B, Covid) Nasopharyngeal Swab     Status: Abnormal   Collection Time: 02/28/21  6:41 PM   Specimen: Nasopharyngeal Swab; Nasopharyngeal(NP) swabs in vial transport medium  Result Value Ref Range Status   SARS Coronavirus 2 by RT PCR NEGATIVE NEGATIVE Final    Comment: (NOTE) SARS-CoV-2 target nucleic acids are NOT DETECTED.  The SARS-CoV-2 RNA is generally detectable in upper respiratory specimens during the acute phase of infection. The lowest concentration of SARS-CoV-2 viral copies this assay can detect is 138 copies/mL. A negative result does not preclude SARS-Cov-2 infection and should not be used as the sole basis for treatment or other patient management decisions. A negative result may occur with  improper specimen collection/handling, submission of specimen other than nasopharyngeal swab, presence of viral mutation(s) within the areas targeted by this assay, and inadequate number of viral copies(<138 copies/mL). A negative result must be combined with clinical observations, patient history, and epidemiological information. The expected result is Negative.  Fact Sheet for Patients:  EntrepreneurPulse.com.au  Fact Sheet for Healthcare Providers:   IncredibleEmployment.be  This test is no t yet approved or cleared by the [place] FDA and  has been authorized for detection and/or diagnosis of SARS-CoV-2 by FDA under an Emergency Use Authorization (EUA). This EUA will remain  in effect (meaning this test can be used) for the duration of the COVID-19 declaration under Section 564(b)(1) of the Act, 21 U.S.C.section 360bbb-3(b)(1), unless the authorization is terminated  or revoked sooner.       Influenza A by PCR POSITIVE (A) NEGATIVE Final   Influenza B by PCR NEGATIVE NEGATIVE Final    Comment: (NOTE) The Xpert Xpress SARS-CoV-2/FLU/RSV plus assay is intended as an aid in the diagnosis of influenza from Nasopharyngeal swab specimens and should not be used as a sole basis for treatment. Nasal washings and aspirates are unacceptable for Xpert Xpress SARS-CoV-2/FLU/RSV testing.  Fact Sheet for Patients: EntrepreneurPulse.com.au  Fact Sheet for Healthcare Providers: IncredibleEmployment.be  This test is not yet approved or cleared by the [place] FDA and has been authorized for detection and/or diagnosis of SARS-CoV-2 by FDA under an Emergency Use Authorization (EUA). This EUA will remain in effect (meaning this test can be used) for the duration of the COVID-19 declaration under Section 564(b)(1) of the Act, 21 U.S.C. section 360bbb-3(b)(1), unless the authorization is terminated or revoked.  Performed at [facility], [place] [address] [place], [place] [address]     Procedures and diagnostic studies:  No results found.  Medications:    aspirin EC  81 mg Oral Daily   bacitracin   Topical BID   feeding supplement  237 mL Oral BID BM   insulin aspart  0-15 Units Subcutaneous TID WC   insulin aspart  0-5 Units Subcutaneous QHS   insulin glargine-yfgn  10 Units Subcutaneous QHS   levothyroxine  100 mcg Oral Q0600   metoprolol tartrate  25 mg Oral  BID   multivitamin with minerals  1 tablet Oral Daily   pantoprazole  40 mg Oral Daily   potassium chloride  40 mEq Oral Once   sodium chloride flush  3  mL Intravenous Q12H   sodium chloride flush  3 mL Intravenous Q12H   Continuous Infusions:  sodium chloride     sodium chloride       LOS: 3 days   Geradine Girt  [facility]   How to contact the [facility] Attending or Consulting provider [place] or covering provider during after hours [place], for this patient?  Check the care team in [facility] and look for a) attending/consulting [facility] provider listed and b) the [facility] team listed Log into [email or web address] and use [facility]'s universal password to access. If you do not have the password, please contact the hospital operator. Locate the [facility] provider you are looking for under [facility] and page to a number that you can be directly reached. If you still have difficulty reaching the provider, please page the [facility] (Director on Call) for the Hospitalists listed on amion for assistance.  03/04/2021, 1:06 PM

## 2021-03-05 DIAGNOSIS — M799 Soft tissue disorder, unspecified: Secondary | ICD-10-CM

## 2021-03-05 DIAGNOSIS — J101 Influenza due to other identified influenza virus with other respiratory manifestations: Secondary | ICD-10-CM | POA: Diagnosis not present

## 2021-03-05 LAB — GLUCOSE, CAPILLARY
Glucose-Capillary: 256 mg/dL — ABNORMAL HIGH (ref 70–99)
Glucose-Capillary: 178 mg/dL — ABNORMAL HIGH (ref 70–99)
Glucose-Capillary: 273 mg/dL — ABNORMAL HIGH (ref 70–99)
Glucose-Capillary: 263 mg/dL — ABNORMAL HIGH (ref 70–99)

## 2021-03-05 LAB — BASIC METABOLIC PANEL
Anion gap: 8 (ref 5–15)
BUN: 28 mg/dL — ABNORMAL HIGH (ref 8–23)
CO2: 24 mmol/L (ref 22–32)
Calcium: 8.7 mg/dL — ABNORMAL LOW (ref 8.9–10.3)
Chloride: 102 mmol/L (ref 98–111)
Creatinine, Ser: 1.63 mg/dL — ABNORMAL HIGH (ref 0.44–1.00)
GFR, Estimated: 32 mL/min — ABNORMAL LOW (ref >60)
Glucose, Bld: 303 mg/dL — ABNORMAL HIGH (ref 70–99)
Potassium: 4.2 mmol/L (ref 3.5–5.1)
Sodium: 134 mmol/L — ABNORMAL LOW (ref 135–145)

## 2021-03-05 LAB — CBC
HCT: 32.8 % — ABNORMAL LOW (ref 36.0–46.0)
Hemoglobin: 10.6 g/dL — ABNORMAL LOW (ref 12.0–15.0)
MCH: 29.7 pg (ref 26.0–34.0)
MCHC: 32.3 g/dL (ref 30.0–36.0)
MCV: 91.9 fL (ref 80.0–100.0)
Platelets: 184 10*3/uL (ref 150–400)
RBC: 3.57 MIL/uL — ABNORMAL LOW (ref 3.87–5.11)
RDW: 15.3 % (ref 11.5–15.5)
WBC: 8 10*3/uL (ref 4.0–10.5)
nRBC: 0 % (ref 0.0–0.2)

## 2021-03-05 LAB — SURGICAL PCR SCREEN
MRSA, PCR: NEGATIVE
Staphylococcus aureus: POSITIVE — AB

## 2021-03-05 MED ORDER — DOCUSATE SODIUM 100 MG PO CAPS
100.0000 mg | ORAL_CAPSULE | Freq: Two times a day (BID) | ORAL | Status: DC
  Administered 2021-03-05 – 2021-03-13 (×17): 100 mg via ORAL
  Filled 2021-03-05 (×17): qty 1

## 2021-03-05 NOTE — Progress Notes (Addendum)
°  Progress Note    03/05/2021 8:05 AM 2 Days Post-Op  Subjective:  Persistent pain L GT   Vitals:   03/05/21 0000 03/05/21 0352  BP: 130/65 (!) 151/82  Pulse: 93 (!) 101  Resp: 13 20  Temp:  97.6 F (36.4 C)  SpO2: 95% 98%   Physical Exam: Lungs:  non labored Abdomen:  soft Neurologic: A&O  CBC    Component Value Date/Time   WBC 8.0 03/05/2021 0150   RBC 3.57 (L) 03/05/2021 0150   HGB 10.6 (L) 03/05/2021 0150   HGB 12.7 08/24/2020 1005   HGB 12.0 02/21/2016 1035   HCT 32.8 (L) 03/05/2021 0150   HCT 38.8 08/24/2020 1005   HCT 36.4 02/21/2016 1035   PLT 184 03/05/2021 0150   PLT 226 08/24/2020 1005   MCV 91.9 03/05/2021 0150   MCV 88 08/24/2020 1005   MCV 87.4 02/21/2016 1035   MCH 29.7 03/05/2021 0150   MCHC 32.3 03/05/2021 0150   RDW 15.3 03/05/2021 0150   RDW 13.3 08/24/2020 1005   RDW 14.2 02/21/2016 1035   LYMPHSABS 3.1 03/01/2021 0423   LYMPHSABS 2.6 08/24/2020 1005   LYMPHSABS 2.6 02/21/2016 1035   MONOABS 1.2 (H) 03/01/2021 0423   MONOABS 0.7 02/21/2016 1035   EOSABS 0.2 03/01/2021 0423   EOSABS 0.5 (H) 08/24/2020 1005   BASOSABS 0.0 03/01/2021 0423   BASOSABS 0.1 08/24/2020 1005   BASOSABS 0.1 02/21/2016 1035    BMET    Component Value Date/Time   NA 134 (L) 03/05/2021 0150   NA 138 10/26/2020 1137   NA 138 02/21/2016 1035   K 4.2 03/05/2021 0150   K 4.5 02/21/2016 1035   CL 102 03/05/2021 0150   CL 106 04/10/2012 0921   CO2 24 03/05/2021 0150   CO2 24 02/21/2016 1035   GLUCOSE 303 (H) 03/05/2021 0150   GLUCOSE 194 (H) 02/21/2016 1035   GLUCOSE 216 (H) 04/10/2012 0921   BUN 28 (H) 03/05/2021 0150   BUN 32 (H) 10/26/2020 1137   BUN 18.7 02/21/2016 1035   CREATININE 1.63 (H) 03/05/2021 0150   CREATININE 1.4 (H) 02/21/2016 1035   CALCIUM 8.7 (L) 03/05/2021 0150   CALCIUM 9.0 02/21/2016 1035   GFRNONAA 32 (L) 03/05/2021 0150   GFRAA 32 (L) 03/11/2020 0913    INR    Component Value Date/Time   INR 1.4 (H) 05/04/2020 1544      Intake/Output Summary (Last 24 hours) at 03/05/2021 0805 Last data filed at 03/05/2021 0357 Gross per 24 hour  Intake 249 ml  Output 1100 ml  Net -851 ml     Assessment/Plan:  81 y.o. female with LLE tissue loss  No options for revascularization of LLE based on angiography Plan is for L AKA tomorrow Consent ordered NPO past midnight   Dagoberto Ligas, PA-C [facility] [phone] 03/05/2021 8:05 AM  VASCULAR STAFF ADDENDUM: I have independently interviewed and examined the patient. I agree with the above.   [name]. Stanford Breed, MD [facility] of [facility] Phone Number: [phone] 03/05/2021 10:02 AM

## 2021-03-05 NOTE — Progress Notes (Signed)
Progress Note    [name]  KGU:[id number] DOB: March 30, 1940  DOA: 02/28/2021 PCP: [name], MD    Brief Narrative:     Medical records reviewed and are as summarized below:  [name] is an 81 y.o. female with medical history significant of DM2, HTN HLD, CAD sp CABG recent Covid.  Admitted for  elevated troponin and leg weakness.  Found to have worsening ABIs and non-healing foot wounds.  S/p repeat angiogram and plan is for amputation on Monday.   Assessment/Plan:   Principal Problem:   Influenza A Active Problems:   Hypothyroidism   Type 2 diabetes mellitus with diabetic neuropathy, without long-term current use of insulin (HCC)   Essential hypertension   Coronary atherosclerosis   GERD   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 female breast ([place])   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HCC)   PAD (peripheral artery disease) (HCC)   Hyperlipidemia   Diabetic peripheral neuropathy (HCC)   Bilateral lower extremity edema   Hyponatremia   Lower extremity weakness   Troponin level elevated   Diplopia   Leg weakness   Diplopia/LE weakness/reported "clumsiness"  -MRI brain negative -resolved symptoms -suspect related to [facility]  PAD (peripheral artery disease) (HCC) Bilateral claudication  -ABI abnormal -vascular consult- plan for amputation on Monday if patient agreeable due to severely disadvantaged outflow into the foot.     Influenza A Pt with recent infection - do not think active infection   Hypothyroidism -  continue home medications at current dose TSH slightly elevated   Type 2 diabetes mellitus with diabetic neuropathy, without long-term current use of insulin (HCC)  - SSI  continue lantus  Essential hypertension Continue metoprolol   Bilateral lower extremity edema Possible side effect of lyrica Negative for DVT   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 female breast ([place]) Remote stable    GERD Continue PPI   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[place])  -chronic avoid nephrotoxic medications such as NSAIDs, Vanco/Zosyn combo,  avoid hypotension, continue to follow renal function   Hyperlipidemia Stable cont home meds   Lower extremity weakness Unclear etiology, given recent viral illness GBS is on differential but unlikley given Reflexes intact and patient reports improvement overnight -MRI lumbar spine done -Other differential includes reaction to lyrica will hold -PT/OT assessment   Diabetic peripheral neuropathy (HCC) Hold lyrica (per cards notes patient's issues started when lyrica strarted 2/4)   Hyponatremia -resolved   Elevated troponin/NSTEMI  -no chest pain no EKG changes in the setting of  chronic kidney disease likely due to demand ischemia and poor clearance, monitor on telemetry and cycle cardiac enzymes to trend. -heparin gtt x 48 hours- plan to d/c -cards consult    Coronary atherosclerosis  - chronic, continue aspirin and beta blocker -cards following        Family Communication/Anticipated D/C date and plan/Code Status   DVT prophylaxis: heparin gtt x 48 hours now heparin sq Code Status: DNR   Disposition Plan: Status is: inpt            Medical Consultants:   Cards vascular  Subjective:   Tearful about loosing leg  Objective:    Vitals:   03/05/21 0352 03/05/21 0800 03/05/21 1107 03/05/21 1125  BP: (!) 151/82 (!) 176/65 (!) 186/83 (!) 186/83  Pulse: (!) 101 96  90  Resp: 20 14  19   Temp: 97.6 F (36.4 C)  98.3 F (36.8 C)  97.7 F (36.5 C)  TempSrc: Oral Oral  Oral  SpO2: 98% 99%  100%  Weight:        Intake/Output Summary (Last 24 hours) at 03/05/2021 1424 Last data filed at 03/05/2021 1315 Gross per 24 hour  Intake 726 ml  Output 1100 ml  Net -374 ml   Filed Weights   02/28/21 1900  Weight: 66.7 kg    Exam:   General: Appearance:     Overweight female in no acute distress     Lungs:     respirations unlabored  Heart:    Normal heart rate.    MS:   All extremities are intact.    Neurologic:   Awake, alert- tearful at times           Data Reviewed:   I have personally reviewed following labs and imaging studies:  Labs: Labs show the following:   Basic Metabolic Panel: Recent Labs  Lab 03/01/21 0128 03/01/21 0423 03/02/21 0042 03/03/21 0333 03/03/21 1739 03/04/21 0134 03/05/21 0150  NA 135 136 135 134*  --  136 134*  K 3.5 3.4* 4.0 4.2  --  4.4 4.2  CL 102 101 101 99  --  102 102  CO2 21* 24 21* 23  --  21* 24  GLUCOSE 275* 183* 185* 278*  --  217* 303*  BUN 32* 30* 28* 26*  --  23 28*  CREATININE 1.76* 1.70* 1.58* 1.62* 1.56* 1.72* 1.63*  CALCIUM 9.1 9.1 8.7* 8.7*  --  9.2 8.7*  MG 1.7 1.8  --   --   --   --   --   PHOS 3.7 3.9  --   --   --   --   --    GFR Estimated Creatinine Clearance: 26.1 mL/min (A) (by C-G formula based on SCr of 1.63 mg/dL (H)). Liver Function Tests: Recent Labs  Lab 03/01/21 0128 03/01/21 0423  AST 18 19  ALT 18 18  ALKPHOS 73 66  BILITOT 0.7 0.5  PROT 5.6* 5.8*  ALBUMIN 2.7* 2.8*   No results for input(s): LIPASE, AMYLASE in the last 168 hours. No results for input(s): AMMONIA in the last 168 hours. Coagulation profile No results for input(s): INR, PROTIME in the last 168 hours.  CBC: Recent Labs  Lab 03/01/21 0128 03/01/21 0423 03/02/21 0640 03/03/21 0333 03/03/21 1739 03/04/21 0134 03/05/21 0150  WBC 10.8* 9.2 8.1 7.2 9.9 11.8* 8.0  NEUTROABS 6.1 4.7  --   --   --   --   --   HGB 10.7* 11.6* 12.7 11.9* 12.1 13.1 10.6*  HCT 32.6* 35.8* 37.5 36.0 38.1 40.5 32.8*  MCV 92.9 92.5 89.7 91.1 92.3 92.9 91.9  PLT 154 139* 174 187 191 203 184   Cardiac Enzymes: Recent Labs  Lab 03/01/21 0128  CKTOTAL 64   BNP (last 3 results) Recent Labs    09/01/20 1023 10/26/20 1137 02/21/21 1209  PROBNP 2,950* 2,308* 200.0*   CBG: Recent Labs  Lab 03/04/21 1033 03/04/21 1548 03/04/21 2126  03/05/21 0639 03/05/21 1124  GLUCAP 236* 393* 309* 256* 178*   D-Dimer: No results for input(s): DDIMER in the last 72 hours.  Hgb A1c: No results for input(s): HGBA1C in the last 72 hours.  Lipid Profile: Recent Labs    03/04/21 0134  CHOL 104  HDL 38*  LDLCALC 35  TRIG 154*  CHOLHDL 2.7   Thyroid function studies: No results for input(s):  TSH, T4TOTAL, T3FREE, THYROIDAB in the last 72 hours.  Invalid input(s): FREET3  Anemia work up: No results for input(s): VITAMINB12, FOLATE, FERRITIN, TIBC, IRON, RETICCTPCT in the last 72 hours.  Sepsis Labs: Recent Labs  Lab 03/03/21 0333 03/03/21 1739 03/04/21 0134 03/05/21 0150  WBC 7.2 9.9 11.8* 8.0    Microbiology Recent Results (from the past 240 hour(s))  Resp Panel by RT-PCR (Flu A&B, Covid) Nasopharyngeal Swab     Status: Abnormal   Collection Time: 02/28/21  6:41 PM   Specimen: Nasopharyngeal Swab; Nasopharyngeal(NP) swabs in vial transport medium  Result Value Ref Range Status   SARS Coronavirus 2 by RT PCR NEGATIVE NEGATIVE Final    Comment: (NOTE) SARS-CoV-2 target nucleic acids are NOT DETECTED.  The SARS-CoV-2 RNA is generally detectable in upper respiratory specimens during the acute phase of infection. The lowest concentration of SARS-CoV-2 viral copies this assay can detect is 138 copies/mL. A negative result does not preclude SARS-Cov-2 infection and should not be used as the sole basis for treatment or other patient management decisions. A negative result may occur with  improper specimen collection/handling, submission of specimen other than nasopharyngeal swab, presence of viral mutation(s) within the areas targeted by this assay, and inadequate number of viral copies(<138 copies/mL). A negative result must be combined with clinical observations, patient history, and epidemiological information. The expected result is Negative.  Fact Sheet for Patients:   EntrepreneurPulse.com.au  Fact Sheet for Healthcare Providers:  IncredibleEmployment.be  This test is no t yet approved or cleared by the [place] FDA and  has been authorized for detection and/or diagnosis of SARS-CoV-2 by FDA under an Emergency Use Authorization (EUA). This EUA will remain  in effect (meaning this test can be used) for the duration of the COVID-19 declaration under Section 564(b)(1) of the Act, 21 U.S.C.section 360bbb-3(b)(1), unless the authorization is terminated  or revoked sooner.       Influenza A by PCR POSITIVE (A) NEGATIVE Final   Influenza B by PCR NEGATIVE NEGATIVE Final    Comment: (NOTE) The Xpert Xpress SARS-CoV-2/FLU/RSV plus assay is intended as an aid in the diagnosis of influenza from Nasopharyngeal swab specimens and should not be used as a sole basis for treatment. Nasal washings and aspirates are unacceptable for Xpert Xpress SARS-CoV-2/FLU/RSV testing.  Fact Sheet for Patients: EntrepreneurPulse.com.au  Fact Sheet for Healthcare Providers: IncredibleEmployment.be  This test is not yet approved or cleared by the [place] FDA and has been authorized for detection and/or diagnosis of SARS-CoV-2 by FDA under an Emergency Use Authorization (EUA). This EUA will remain in effect (meaning this test can be used) for the duration of the COVID-19 declaration under Section 564(b)(1) of the Act, 21 U.S.C. section 360bbb-3(b)(1), unless the authorization is terminated or revoked.  Performed at [facility], [place] [address] [place], [place] [address]   Surgical pcr screen     Status: Abnormal   Collection Time: 03/05/21 11:26 AM   Specimen: Nasal Mucosa; Nasal Swab  Result Value Ref Range Status   MRSA, PCR NEGATIVE NEGATIVE Final   Staphylococcus aureus POSITIVE (A) NEGATIVE Final    Comment: (NOTE) The Xpert SA Assay (FDA approved for NASAL specimens in  patients 73 years of age and older), is one component of a comprehensive surveillance program. It is not intended to diagnose infection nor to guide or monitor treatment. Performed at [facility], [place] [address] [place], [place] [address]     Procedures and diagnostic  studies:  No results found.  Medications:    aspirin EC  81 mg Oral Daily   bacitracin   Topical BID   docusate sodium  100 mg Oral BID   feeding supplement  237 mL Oral BID BM   heparin injection (subcutaneous)  5,000 Units Subcutaneous Q8H   insulin aspart  0-15 Units Subcutaneous TID WC   insulin aspart  0-5 Units Subcutaneous QHS   insulin glargine-yfgn  10 Units Subcutaneous QHS   levothyroxine  100 mcg Oral Q0600   metoprolol tartrate  25 mg Oral BID   multivitamin with minerals  1 tablet Oral Daily   pantoprazole  40 mg Oral Daily   potassium chloride  40 mEq Oral Once   sodium chloride flush  3 mL Intravenous Q12H   sodium chloride flush  3 mL Intravenous Q12H   Continuous Infusions:  sodium chloride     sodium chloride       LOS: 4 days   Geradine Girt  [facility]   How to contact the [facility] Attending or Consulting provider [place] or covering provider during after hours [place], for this patient?  Check the care team in [facility] and look for a) attending/consulting TR[facility] provider listed and b) the [facility] team listed Log into [email or web address] and use [place]'s universal password to access. If you do not have the password, please contact the hospital operator. Locate the [facility] provider you are looking for under [facility] and page to a number that you can be directly reached. If you still have difficulty reaching the provider, please page the [facility] (Director on Call) for the Hospitalists listed on amion for assistance.  03/05/2021, 2:24 PM

## 2021-03-06 ENCOUNTER — Inpatient Hospital Stay (HOSPITAL_COMMUNITY): Payer: Medicare Other | Admitting: Certified Registered Nurse Anesthetist

## 2021-03-06 ENCOUNTER — Encounter (HOSPITAL_COMMUNITY)
Admission: EM | Disposition: A | Discharge status: Rehabilitation Facility | Payer: Self-pay | Source: Home / Self Care | Attending: Internal Medicine

## 2021-03-06 ENCOUNTER — Encounter (HOSPITAL_COMMUNITY): Payer: Self-pay | Admitting: Vascular Surgery

## 2021-03-06 ENCOUNTER — Other Ambulatory Visit: Payer: Self-pay

## 2021-03-06 ENCOUNTER — Ambulatory Visit: Payer: Medicare Other | Admitting: Family Medicine

## 2021-03-06 DIAGNOSIS — M799 Soft tissue disorder, unspecified: Secondary | ICD-10-CM

## 2021-03-06 DIAGNOSIS — Z7984 Long term (current) use of oral hypoglycemic drugs: Secondary | ICD-10-CM

## 2021-03-06 DIAGNOSIS — L97529 Non-pressure chronic ulcer of other part of left foot with unspecified severity: Secondary | ICD-10-CM

## 2021-03-06 DIAGNOSIS — Z794 Long term (current) use of insulin: Secondary | ICD-10-CM

## 2021-03-06 DIAGNOSIS — I70245 Atherosclerosis of native arteries of left leg with ulceration of other part of foot: Secondary | ICD-10-CM

## 2021-03-06 DIAGNOSIS — E1151 Type 2 diabetes mellitus with diabetic peripheral angiopathy without gangrene: Secondary | ICD-10-CM

## 2021-03-06 DIAGNOSIS — J101 Influenza due to other identified influenza virus with other respiratory manifestations: Secondary | ICD-10-CM | POA: Diagnosis not present

## 2021-03-06 HISTORY — PX: AMPUTATION: SHX166

## 2021-03-06 LAB — CBC
HCT: 33.4 % — ABNORMAL LOW (ref 36.0–46.0)
Hemoglobin: 10.7 g/dL — ABNORMAL LOW (ref 12.0–15.0)
MCH: 29.2 pg (ref 26.0–34.0)
MCHC: 32 g/dL (ref 30.0–36.0)
MCV: 91 fL (ref 80.0–100.0)
Platelets: 227 10*3/uL (ref 150–400)
RBC: 3.67 MIL/uL — ABNORMAL LOW (ref 3.87–5.11)
RDW: 15.4 % (ref 11.5–15.5)
WBC: 8.7 10*3/uL (ref 4.0–10.5)
nRBC: 0 % (ref 0.0–0.2)

## 2021-03-06 LAB — BASIC METABOLIC PANEL
Anion gap: 11 (ref 5–15)
BUN: 24 mg/dL — ABNORMAL HIGH (ref 8–23)
CO2: 23 mmol/L (ref 22–32)
Calcium: 9.1 mg/dL (ref 8.9–10.3)
Chloride: 101 mmol/L (ref 98–111)
Creatinine, Ser: 1.64 mg/dL — ABNORMAL HIGH (ref 0.44–1.00)
GFR, Estimated: 32 mL/min — ABNORMAL LOW (ref >60)
Glucose, Bld: 252 mg/dL — ABNORMAL HIGH (ref 70–99)
Potassium: 4.3 mmol/L (ref 3.5–5.1)
Sodium: 135 mmol/L (ref 135–145)

## 2021-03-06 LAB — GLUCOSE, CAPILLARY
Glucose-Capillary: 207 mg/dL — ABNORMAL HIGH (ref 70–99)
Glucose-Capillary: 163 mg/dL — ABNORMAL HIGH (ref 70–99)
Glucose-Capillary: 227 mg/dL — ABNORMAL HIGH (ref 70–99)
Glucose-Capillary: 290 mg/dL — ABNORMAL HIGH (ref 70–99)
Glucose-Capillary: 338 mg/dL — ABNORMAL HIGH (ref 70–99)
Glucose-Capillary: 306 mg/dL — ABNORMAL HIGH (ref 70–99)

## 2021-03-06 SURGERY — AMPUTATION, ABOVE KNEE
Anesthesia: General | Site: Knee | Laterality: Left

## 2021-03-06 MED ORDER — HEPARIN SODIUM (PORCINE) 5000 UNIT/ML IJ SOLN
5000.0000 [IU] | Freq: Three times a day (TID) | INTRAMUSCULAR | Status: DC
  Administered 2021-03-06 – 2021-03-13 (×21): 5000 [IU] via SUBCUTANEOUS
  Filled 2021-03-06 (×21): qty 1

## 2021-03-06 MED ORDER — FENTANYL CITRATE (PF) 250 MCG/5ML IJ SOLN
INTRAMUSCULAR | Status: DC | PRN
  Administered 2021-03-06: 50 ug via INTRAVENOUS
  Administered 2021-03-06: 150 ug via INTRAVENOUS
  Administered 2021-03-06: 50 ug via INTRAVENOUS

## 2021-03-06 MED ORDER — PROMETHAZINE HCL 25 MG/ML IJ SOLN
INTRAMUSCULAR | Status: AC
  Filled 2021-03-06: qty 1

## 2021-03-06 MED ORDER — DEXAMETHASONE SODIUM PHOSPHATE 10 MG/ML IJ SOLN
INTRAMUSCULAR | Status: AC
  Filled 2021-03-06: qty 1

## 2021-03-06 MED ORDER — MORPHINE SULFATE (PF) 2 MG/ML IV SOLN
0.5000 mg | INTRAVENOUS | Status: DC | PRN
  Administered 2021-03-06 (×2): 1 mg via INTRAVENOUS
  Filled 2021-03-06 (×2): qty 1

## 2021-03-06 MED ORDER — FENTANYL CITRATE (PF) 250 MCG/5ML IJ SOLN
INTRAMUSCULAR | Status: AC
  Filled 2021-03-06: qty 5

## 2021-03-06 MED ORDER — ONDANSETRON HCL 4 MG/2ML IJ SOLN
INTRAMUSCULAR | Status: AC
  Administered 2021-03-06: 4 mg via INTRAVENOUS
  Filled 2021-03-06: qty 2

## 2021-03-06 MED ORDER — ONDANSETRON HCL 4 MG/2ML IJ SOLN: 4.0000 mg | Freq: Once | INTRAMUSCULAR | Status: AC

## 2021-03-06 MED ORDER — ROCURONIUM BROMIDE 10 MG/ML (PF) SYRINGE
PREFILLED_SYRINGE | INTRAVENOUS | Status: DC | PRN
  Administered 2021-03-06: 30 mg via INTRAVENOUS

## 2021-03-06 MED ORDER — ONDANSETRON HCL 4 MG/2ML IJ SOLN: 4.0000 mg | Freq: Four times a day (QID) | INTRAMUSCULAR | Status: DC | PRN

## 2021-03-06 MED ORDER — GUAIFENESIN-DM 100-10 MG/5ML PO SYRP: 15.0000 mL | ORAL_SOLUTION | ORAL | Status: DC | PRN

## 2021-03-06 MED ORDER — LABETALOL HCL 5 MG/ML IV SOLN
INTRAVENOUS | Status: AC
  Filled 2021-03-06: qty 4

## 2021-03-06 MED ORDER — HYDROMORPHONE HCL 1 MG/ML IJ SOLN
INTRAMUSCULAR | Status: AC
  Filled 2021-03-06: qty 1

## 2021-03-06 MED ORDER — MIDAZOLAM HCL 2 MG/2ML IJ SOLN: 0.5000 mg | Freq: Once | INTRAMUSCULAR | Status: DC | PRN

## 2021-03-06 MED ORDER — CHLORHEXIDINE GLUCONATE 0.12 % MT SOLN: 15.0000 mL | Freq: Once | OROMUCOSAL | Status: AC

## 2021-03-06 MED ORDER — SUCCINYLCHOLINE CHLORIDE 200 MG/10ML IV SOSY
PREFILLED_SYRINGE | INTRAVENOUS | Status: DC | PRN
Start: 2021-03-06 — End: 2021-03-06
  Administered 2021-03-06: 120 mg via INTRAVENOUS

## 2021-03-06 MED ORDER — TRAMADOL HCL 50 MG PO TABS
50.0000 mg | ORAL_TABLET | Freq: Two times a day (BID) | ORAL | Status: DC
  Administered 2021-03-06 – 2021-03-13 (×15): 50 mg via ORAL
  Filled 2021-03-06 (×15): qty 1

## 2021-03-06 MED ORDER — HYDROMORPHONE HCL 1 MG/ML IJ SOLN
0.2500 mg | INTRAMUSCULAR | Status: DC | PRN
  Administered 2021-03-06: 0.25 mg via INTRAVENOUS
  Administered 2021-03-06: 0.5 mg via INTRAVENOUS
  Administered 2021-03-06 (×3): 0.25 mg via INTRAVENOUS

## 2021-03-06 MED ORDER — ALUM & MAG HYDROXIDE-SIMETH 200-200-20 MG/5ML PO SUSP: 15.0000 mL | ORAL | Status: DC | PRN

## 2021-03-06 MED ORDER — PROPOFOL 1000 MG/100ML IV EMUL
INTRAVENOUS | Status: AC
  Filled 2021-03-06: qty 100

## 2021-03-06 MED ORDER — HYDRALAZINE HCL 20 MG/ML IJ SOLN: 5.0000 mg | INTRAMUSCULAR | Status: DC | PRN

## 2021-03-06 MED ORDER — ONDANSETRON HCL 4 MG/2ML IJ SOLN
INTRAMUSCULAR | Status: DC | PRN
  Administered 2021-03-06: 4 mg via INTRAVENOUS

## 2021-03-06 MED ORDER — MEPERIDINE HCL 25 MG/ML IJ SOLN: 6.2500 mg | INTRAMUSCULAR | Status: DC | PRN

## 2021-03-06 MED ORDER — PHENYLEPHRINE HCL (PRESSORS) 10 MG/ML IV SOLN
INTRAVENOUS | Status: DC | PRN
  Administered 2021-03-06 (×2): 40 ug via INTRAVENOUS

## 2021-03-06 MED ORDER — PROMETHAZINE HCL 25 MG/ML IJ SOLN
6.2500 mg | INTRAMUSCULAR | Status: DC | PRN
  Administered 2021-03-06: 6.25 mg via INTRAVENOUS

## 2021-03-06 MED ORDER — PHENOL 1.4 % MT LIQD: 1.0000 | OROMUCOSAL | Status: DC | PRN

## 2021-03-06 MED ORDER — CEFAZOLIN SODIUM-DEXTROSE 1-4 GM/50ML-% IV SOLN
INTRAVENOUS | Status: AC
  Filled 2021-03-06: qty 50

## 2021-03-06 MED ORDER — ONDANSETRON HCL 4 MG/2ML IJ SOLN
INTRAMUSCULAR | Status: AC
  Filled 2021-03-06: qty 4

## 2021-03-06 MED ORDER — ORAL CARE MOUTH RINSE: 15.0000 mL | Freq: Once | OROMUCOSAL | Status: AC

## 2021-03-06 MED ORDER — OXYCODONE HCL 5 MG PO TABS: 5.0000 mg | ORAL_TABLET | Freq: Once | ORAL | Status: DC | PRN

## 2021-03-06 MED ORDER — CHLORHEXIDINE GLUCONATE 0.12 % MT SOLN
OROMUCOSAL | Status: AC
  Administered 2021-03-06: 15 mL via OROMUCOSAL
  Filled 2021-03-06: qty 15

## 2021-03-06 MED ORDER — LIDOCAINE 2% (20 MG/ML) 5 ML SYRINGE
INTRAMUSCULAR | Status: DC | PRN
Start: 2021-03-06 — End: 2021-03-06
  Administered 2021-03-06: 40 mg via INTRAVENOUS

## 2021-03-06 MED ORDER — 0.9 % SODIUM CHLORIDE (POUR BTL) OPTIME
TOPICAL | Status: DC | PRN
  Administered 2021-03-06: 1000 mL

## 2021-03-06 MED ORDER — PANTOPRAZOLE SODIUM 40 MG PO TBEC: 40.0000 mg | DELAYED_RELEASE_TABLET | Freq: Every day | ORAL | Status: DC

## 2021-03-06 MED ORDER — HEMOSTATIC AGENTS (NO CHARGE) OPTIME
TOPICAL | Status: DC | PRN
  Administered 2021-03-06: 1 via TOPICAL

## 2021-03-06 MED ORDER — OXYCODONE HCL 5 MG/5ML PO SOLN: 5.0000 mg | Freq: Once | ORAL | Status: DC | PRN

## 2021-03-06 MED ORDER — LACTATED RINGERS IV SOLN: INTRAVENOUS | Status: DC

## 2021-03-06 MED ORDER — DEXAMETHASONE SODIUM PHOSPHATE 10 MG/ML IJ SOLN
INTRAMUSCULAR | Status: DC | PRN
  Administered 2021-03-06: 5 mg via INTRAVENOUS

## 2021-03-06 MED ORDER — LABETALOL HCL 5 MG/ML IV SOLN
10.0000 mg | INTRAVENOUS | Status: DC | PRN
  Administered 2021-03-09 – 2021-03-10 (×3): 10 mg via INTRAVENOUS
  Filled 2021-03-06 (×4): qty 4

## 2021-03-06 MED ORDER — HYDROCODONE-ACETAMINOPHEN 7.5-325 MG PO TABS
1.0000 | ORAL_TABLET | ORAL | Status: DC | PRN
  Administered 2021-03-06 – 2021-03-08 (×10): 2 via ORAL
  Administered 2021-03-09: 1 via ORAL
  Administered 2021-03-10 – 2021-03-11 (×3): 2 via ORAL
  Administered 2021-03-12: 1 via ORAL
  Administered 2021-03-12 – 2021-03-13 (×6): 2 via ORAL
  Filled 2021-03-06 (×8): qty 2
  Filled 2021-03-06: qty 1
  Filled 2021-03-06 (×14): qty 2

## 2021-03-06 MED ORDER — PROPOFOL 10 MG/ML IV BOLUS
INTRAVENOUS | Status: DC | PRN
Start: 2021-03-06 — End: 2021-03-06
  Administered 2021-03-06: 70 mg via INTRAVENOUS

## 2021-03-06 MED ORDER — SUGAMMADEX SODIUM 200 MG/2ML IV SOLN
INTRAVENOUS | Status: DC | PRN
Start: 2021-03-06 — End: 2021-03-06
  Administered 2021-03-06: 200 mg via INTRAVENOUS

## 2021-03-06 MED ORDER — SODIUM CHLORIDE 0.9 % IV SOLN: INTRAVENOUS | Status: DC

## 2021-03-06 MED ORDER — METOPROLOL TARTRATE 5 MG/5ML IV SOLN: 2.0000 mg | INTRAVENOUS | Status: DC | PRN

## 2021-03-06 MED ORDER — ACETAMINOPHEN 325 MG PO TABS: 325.0000 mg | ORAL_TABLET | Freq: Four times a day (QID) | ORAL | Status: DC | PRN

## 2021-03-06 MED ORDER — LABETALOL HCL 5 MG/ML IV SOLN
INTRAVENOUS | Status: DC | PRN
  Administered 2021-03-06 (×2): 5 mg via INTRAVENOUS

## 2021-03-06 MED ORDER — CEFAZOLIN SODIUM-DEXTROSE 2-3 GM-%(50ML) IV SOLR
INTRAVENOUS | Status: DC | PRN
  Administered 2021-03-06: 2 g via INTRAVENOUS

## 2021-03-06 MED ORDER — POTASSIUM CHLORIDE CRYS ER 20 MEQ PO TBCR: 20.0000 meq | EXTENDED_RELEASE_TABLET | Freq: Every day | ORAL | Status: DC | PRN

## 2021-03-06 MED ORDER — MAGNESIUM SULFATE 2 GM/50ML IV SOLN: 2.0000 g | Freq: Every day | INTRAVENOUS | Status: DC | PRN

## 2021-03-06 SURGICAL SUPPLY — 58 items
BAG COUNTER SPONGE SURGICOUNT (BAG) ×2 IMPLANT
BAG SPNG CNTER NS LX DISP (BAG) ×1
BANDAGE ESMARK 6X9 LF (GAUZE/BANDAGES/DRESSINGS) ×1 IMPLANT
BLADE SAW SAG 73X25 THK (BLADE) ×1
BLADE SAW SGTL 73X25 THK (BLADE) ×1 IMPLANT
BNDG CMPR 9X6 STRL LF SNTH (GAUZE/BANDAGES/DRESSINGS)
BNDG COHESIVE 6X5 TAN STRL LF (GAUZE/BANDAGES/DRESSINGS) ×2 IMPLANT
BNDG ELASTIC 4X5.8 VLCR STR LF (GAUZE/BANDAGES/DRESSINGS) ×2 IMPLANT
BNDG ELASTIC 6X5.8 VLCR STR LF (GAUZE/BANDAGES/DRESSINGS) ×2 IMPLANT
BNDG ESMARK 6X9 LF (GAUZE/BANDAGES/DRESSINGS)
BNDG GAUZE ELAST 4 BULKY (GAUZE/BANDAGES/DRESSINGS) ×3 IMPLANT
CANISTER SUCT 3000ML PPV (MISCELLANEOUS) ×2 IMPLANT
CLIP LIGATING EXTRA MED SLVR (CLIP) ×2 IMPLANT
CLIP LIGATING EXTRA SM BLUE (MISCELLANEOUS) ×1 IMPLANT
CLIP TI LARGE 6 (CLIP) ×2 IMPLANT
CLIP VESOCCLUDE MED 6/CT (CLIP) ×2 IMPLANT
COVER BACK TABLE 60X90IN (DRAPES) ×2 IMPLANT
COVER SURGICAL LIGHT HANDLE (MISCELLANEOUS) ×4 IMPLANT
DRAIN CHANNEL 19F RND (DRAIN) IMPLANT
DRAPE HALF SHEET 40X57 (DRAPES) ×2 IMPLANT
DRAPE INCISE 23X17 IOBAN STRL (DRAPES) ×1
DRAPE INCISE 23X17 STRL (DRAPES) ×1 IMPLANT
DRAPE INCISE IOBAN 23X17 STRL (DRAPES) ×1 IMPLANT
DRAPE INCISE IOBAN 66X45 STRL (DRAPES) ×1 IMPLANT
DRAPE ORTHO SPLIT 77X108 STRL (DRAPES) ×4
DRAPE SURG ORHT 6 SPLT 77X108 (DRAPES) ×2 IMPLANT
DRAPE U-SHAPE 47X51 STRL (DRAPES) IMPLANT
DRSG ADAPTIC 3X8 NADH LF (GAUZE/BANDAGES/DRESSINGS) ×2 IMPLANT
ELECT CAUTERY BLADE 6.4 (BLADE) ×2 IMPLANT
ELECT REM PT RETURN 9FT ADLT (ELECTROSURGICAL) ×2
ELECTRODE REM PT RTRN 9FT ADLT (ELECTROSURGICAL) ×1 IMPLANT
EVACUATOR SILICONE 100CC (DRAIN) IMPLANT
GAUZE SPONGE 4X4 12PLY STRL (GAUZE/BANDAGES/DRESSINGS) ×3 IMPLANT
GAUZE XEROFORM 5X9 LF (GAUZE/BANDAGES/DRESSINGS) ×1 IMPLANT
GLOVE SRG 8 PF TXTR STRL LF DI (GLOVE) ×2 IMPLANT
GLOVE SURG POLYISO LF SZ8 (GLOVE) IMPLANT
GLOVE SURG UNDER POLY LF SZ8 (GLOVE) ×4
GOWN STRL REUS W/TWL 2XL LVL3 (GOWN DISPOSABLE) ×2 IMPLANT
HEMOSTAT SNOW SURGICEL 2X4 (HEMOSTASIS) ×1 IMPLANT
KIT BASIN OR (CUSTOM PROCEDURE TRAY) ×2 IMPLANT
KIT TURNOVER KIT B (KITS) ×2 IMPLANT
NS IRRIG 1000ML POUR BTL (IV SOLUTION) ×2 IMPLANT
PACK GENERAL/GYN (CUSTOM PROCEDURE TRAY) ×2 IMPLANT
PAD ARMBOARD 7.5X6 YLW CONV (MISCELLANEOUS) ×4 IMPLANT
STAPLER VISISTAT 35W (STAPLE) ×2 IMPLANT
STOCKINETTE IMPERVIOUS LG (DRAPES) ×2 IMPLANT
SUT ETHILON 3 0 PS 1 (SUTURE) IMPLANT
SUT SILK 0 TIES 10X30 (SUTURE) ×3 IMPLANT
SUT SILK 2 0 (SUTURE) ×2
SUT SILK 2 0 SH CR/8 (SUTURE) ×2 IMPLANT
SUT SILK 2-0 18XBRD TIE 12 (SUTURE) ×1 IMPLANT
SUT VIC AB 0 CT1 18XCR BRD 8 (SUTURE) IMPLANT
SUT VIC AB 0 CT1 8-18 (SUTURE) ×2
SUT VIC AB 2-0 CT1 18 (SUTURE) ×4 IMPLANT
SUT VIC AB 3-0 SH 18 (SUTURE) ×1 IMPLANT
TOWEL GREEN STERILE (TOWEL DISPOSABLE) ×4 IMPLANT
UNDERPAD 30X36 HEAVY ABSORB (UNDERPADS AND DIAPERS) ×2 IMPLANT
WATER STERILE IRR 1000ML POUR (IV SOLUTION) ×2 IMPLANT

## 2021-03-06 NOTE — Progress Notes (Signed)
°  Progress Note    03/06/2021 9:11 AM Day of Surgery  Subjective:  pain in the left leg, nervous re surgery    Vitals:   03/06/21 0405 03/06/21 0848  BP: (!) 160/75 (!) 176/90  Pulse: 100 90  Resp: 18   Temp: 98.3 F (36.8 C)   SpO2: 97%    Physical Exam: Lungs:  non labored Abdomen:  soft Neurologic: A&O  CBC    Component Value Date/Time   WBC 8.7 03/06/2021 0140   RBC 3.67 (L) 03/06/2021 0140   HGB 10.7 (L) 03/06/2021 0140   HGB 12.7 08/24/2020 1005   HGB 12.0 02/21/2016 1035   HCT 33.4 (L) 03/06/2021 0140   HCT 38.8 08/24/2020 1005   HCT 36.4 02/21/2016 1035   PLT 227 03/06/2021 0140   PLT 226 08/24/2020 1005   MCV 91.0 03/06/2021 0140   MCV 88 08/24/2020 1005   MCV 87.4 02/21/2016 1035   MCH 29.2 03/06/2021 0140   MCHC 32.0 03/06/2021 0140   RDW 15.4 03/06/2021 0140   RDW 13.3 08/24/2020 1005   RDW 14.2 02/21/2016 1035   LYMPHSABS 3.1 03/01/2021 0423   LYMPHSABS 2.6 08/24/2020 1005   LYMPHSABS 2.6 02/21/2016 1035   MONOABS 1.2 (H) 03/01/2021 0423   MONOABS 0.7 02/21/2016 1035   EOSABS 0.2 03/01/2021 0423   EOSABS 0.5 (H) 08/24/2020 1005   BASOSABS 0.0 03/01/2021 0423   BASOSABS 0.1 08/24/2020 1005   BASOSABS 0.1 02/21/2016 1035    BMET    Component Value Date/Time   NA 135 03/06/2021 0140   NA 138 10/26/2020 1137   NA 138 02/21/2016 1035   K 4.3 03/06/2021 0140   K 4.5 02/21/2016 1035   CL 101 03/06/2021 0140   CL 106 04/10/2012 0921   CO2 23 03/06/2021 0140   CO2 24 02/21/2016 1035   GLUCOSE 252 (H) 03/06/2021 0140   GLUCOSE 194 (H) 02/21/2016 1035   GLUCOSE 216 (H) 04/10/2012 0921   BUN 24 (H) 03/06/2021 0140   BUN 32 (H) 10/26/2020 1137   BUN 18.7 02/21/2016 1035   CREATININE 1.64 (H) 03/06/2021 0140   CREATININE 1.4 (H) 02/21/2016 1035   CALCIUM 9.1 03/06/2021 0140   CALCIUM 9.0 02/21/2016 1035   GFRNONAA 32 (L) 03/06/2021 0140   GFRAA 32 (L) 03/11/2020 0913    INR    Component Value Date/Time   INR 1.4 (H) 05/04/2020 1544      Intake/Output Summary (Last 24 hours) at 03/06/2021 0911 Last data filed at 03/06/2021 0645 Gross per 24 hour  Intake 486 ml  Output 450 ml  Net 36 ml      Assessment/Plan:  82 y.o. female with LLE tissue loss  No options for revascularization of LLE based on angiography Plan is for L AKA today After discussing the risk and benefits of left lower extremity above-knee amputation for an unreconstructable vascular disease, [name] elected to proceed.    [name]  [facility] [phone] 03/06/2021 9:11 AM

## 2021-03-06 NOTE — Anesthesia Postprocedure Evaluation (Signed)
Anesthesia Post Note  Patient: [name]  Procedure(s) Performed: LEFT ABOVE KNEE AMPUTATION (Left: Knee)     Patient location during evaluation: PACU Anesthesia Type: General Level of consciousness: patient cooperative, oriented and sedated Pain management: pain level controlled (pain much improved) Vital Signs Assessment: post-procedure vital signs reviewed and stable Respiratory status: spontaneous breathing, nonlabored ventilation and respiratory function stable Cardiovascular status: blood pressure returned to baseline and stable Postop Assessment: no apparent nausea or vomiting Anesthetic complications: no   No notable events documented.  Last Vitals:  Vitals:   03/06/21 1130 03/06/21 1145  BP: (!) 168/72 (!) 170/77  Pulse: 94 94  Resp: 16 10  Temp:    SpO2: 98% 97%    Last Pain:  Vitals:   03/06/21 1123  TempSrc:   PainSc: 10-Worst pain ever    LLE Motor Response: Responds to commands (03/06/21 1145)            Shantel Wesely,E. Armandina Iman

## 2021-03-06 NOTE — Op Note (Signed)
° ° °  NAME: PETRITA BLUNCK    MRN: [id number] DOB: [birth year]    DATE OF OPERATION: 03/06/2021  PREOP DIAGNOSIS:    Left lower extremity [place] 5 critical limb ischemia without revascularization options  POSTOP DIAGNOSIS:    same  PROCEDURE:    Left-sided above-knee amputation  SURGEON: [name]  ASSIST: Roxy Horseman, PA  ANESTHESIA: General  EBL: 100 mL  INDICATIONS:    KEITH CANCIO is a 81 y.o. female with medical history significant of DM2, HTN HLD, CAD sp CABG recent Covid.  Admitted for elevated troponin and leg weakness.  Found to have worsening ABIs and non-healing foot wounds.  S/p repeat angiogram which demonstrated no revascularization options.  I had a long discussion with her and her family regarding left-sided above-knee amputation. After discussing these risks and benefits, Carlis elected to proceed.  FINDINGS:   Healthy, viable muscle in the thigh  TECHNIQUE:   Patient was brought to the OR laid in the supine position.  General anesthesia was induced and the patient was prepped and draped in standard fashion.  A timeout was performed.  A fish-mouth incision line was drawn 7cm from the patella for the left above-knee amputation. Sharp dissection and cautery used to the femur.  Healthy bleeding was encountered. Thigh muscles were divided and femoral artery and vein identified.  These were oversewn using two, 2-0 silk pop stick ties and buttressed with a large clip.  Once the circumferential exposure of the femur was complete, a reciprocating saw was used to divide the femur. This was followed by amputation knife through the back of the thigh. The sciatic nerve was identified, pulled to length and ligated with a zero silk tie. Once hemostasis was achieved, warm saline was brought to the field and the wound bed was irrigated.  I used a 0 Vicryl to close the periosteal tissue around the femur.  There was bleeding from the marrow that ceased with the  use of thrombin product..  Next, my attention turned to closure of the fascial layer.  This was done using 2-0 Vicryl pop suture.  The fascial layer was closed along the entirety of the incision.  A stapler was brought to the field to oppose the dermis.  An occlusive dressing was placed and the patient was taken the PACU in stable condition.  Given the complexity of the case a first assistant was necessary in order to expedient the procedure and safely perform the technical aspects of the operation.  Given the complexity of the case a first assistant was necessary in order to expedient the procedure and safely perform the technical aspects of the operation.  Macie Burows, MD [facility]  DATE OF DICTATION:   03/06/2021

## 2021-03-06 NOTE — Progress Notes (Signed)
Progress Note    [name]  NTI:[id number] DOB: 1940/06/24  DOA: 02/28/2021 PCP: [name], MD    Brief Narrative:     Medical records reviewed and are as summarized below:  [name] is an 81 y.o. female with medical history significant of DM2, HTN HLD, CAD sp CABG recent Covid.  Admitted for elevated troponin and leg weakness.  Found to have worsening ABIs and non-healing foot wounds.  S/p repeat angiogram and plan is for amputation on Monday.   Assessment/Plan:   Principal Problem:   Influenza A Active Problems:   Hypothyroidism   Type 2 diabetes mellitus with diabetic neuropathy, without long-term current use of insulin (HCC)   Essential hypertension   Coronary atherosclerosis   GERD   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 female breast ([place])   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HCC)   PAD (peripheral artery disease) (HCC)   Hyperlipidemia   Diabetic peripheral neuropathy (HCC)   Bilateral lower extremity edema   Hyponatremia   Lower extremity weakness   Troponin level elevated   Diplopia   Leg weakness   Diplopia/LE weakness/reported "clumsiness"  -MRI brain negative -resolved symptoms -suspect related to [facility]  PAD (peripheral artery disease) (HCC) Bilateral claudication  -ABI abnormal -vascular consult- plan for amputation on 2/13   Influenza A Pt with recent infection - do not think active infection   Hypothyroidism -  continue home medications at current dose TSH slightly elevated   Type 2 diabetes mellitus with diabetic neuropathy, without long-term current use of insulin (HCC)  - SSI  continue lantus  Essential hypertension Continue metoprolol -adjust BP medications as needed   Bilateral lower extremity edema Possible side effect of lyrica Negative for DVT   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 female breast ([place]) Remote stable   GERD Continue PPI   CKD stage 4 due to type 2  diabetes mellitus (HCC)  -chronic avoid nephrotoxic medications such as NSAIDs, Vanco/Zosyn combo,  avoid hypotension, continue to follow renal function   Hyperlipidemia Stable cont home meds   Lower extremity weakness Unclear etiology, given recent viral illness GBS is on differential but unlikley given Reflexes intact and patient reports improvement overnight -MRI lumbar spine done -Other differential includes reaction to lyrica will hold -PT/OT assessment   Diabetic peripheral neuropathy (HCC) Hold lyrica (per cards notes patient's issues started when lyrica strarted 2/4)   Hyponatremia -resolved   Elevated troponin/NSTEMI  -no chest pain no EKG changes in the setting of  chronic kidney disease likely due to demand ischemia and poor clearance, monitor on telemetry and cycle cardiac enzymes to trend. -heparin gtt x 48 hours- plan to d/c -cards consult    Coronary atherosclerosis  - chronic, continue aspirin and beta blocker -cards following        Family Communication/Anticipated D/C date and plan/Code Status   DVT prophylaxis: heparin gtt x 48 hours now heparin sq Code Status: DNR   Disposition Plan: Status is: inpt    Medical Consultants:   Cards vascular  Subjective:   Anxious about procedure (seen early this AM)  Objective:    Vitals:   03/06/21 0353 03/06/21 0405 03/06/21 0848 03/06/21 0930  BP: (!) 176/83 (!) 160/75 (!) 176/90 (!) 229/84  Pulse: (!) 102 100 90 91  Resp: 17 18 (!) 22 12  Temp: 98 F (36.7 C) 98.3 F (36.8 C)  97.7 F (36.5 C)  TempSrc: Oral Oral  Oral  SpO2: 100% 97%  98%  Weight:        Intake/Output Summary (Last 24 hours) at 03/06/2021 1113 Last data filed at 03/06/2021 1055 Gross per 24 hour  Intake 886 ml  Output 500 ml  Net 386 ml   Filed Weights   02/28/21 1900  Weight: 66.7 kg    Exam:    General: Appearance:     Overweight female      Lungs:     respirations unlabored  Heart:    Normal heart rate.     MS:   All extremities are intact.    Neurologic:   Awake, alert, anxious appearing       Data Reviewed:   I have personally reviewed following labs and imaging studies:  Labs: Labs show the following:   Basic Metabolic Panel: Recent Labs  Lab 03/01/21 0128 03/01/21 0423 03/02/21 0042 03/03/21 0333 03/03/21 1739 03/04/21 0134 03/05/21 0150 03/06/21 0140  NA 135 136 135 134*  --  136 134* 135  K 3.5 3.4* 4.0 4.2  --  4.4 4.2 4.3  CL 102 101 101 99  --  102 102 101  CO2 21* 24 21* 23  --  21* 24 23  GLUCOSE 275* 183* 185* 278*  --  217* 303* 252*  BUN 32* 30* 28* 26*  --  23 28* 24*  CREATININE 1.76* 1.70* 1.58* 1.62* 1.56* 1.72* 1.63* 1.64*  CALCIUM 9.1 9.1 8.7* 8.7*  --  9.2 8.7* 9.1  MG 1.7 1.8  --   --   --   --   --   --   PHOS 3.7 3.9  --   --   --   --   --   --    GFR Estimated Creatinine Clearance: 25.9 mL/min (A) (by C-G formula based on SCr of 1.64 mg/dL (H)). Liver Function Tests: Recent Labs  Lab 03/01/21 0128 03/01/21 0423  AST 18 19  ALT 18 18  ALKPHOS 73 66  BILITOT 0.7 0.5  PROT 5.6* 5.8*  ALBUMIN 2.7* 2.8*   No results for input(s): LIPASE, AMYLASE in the last 168 hours. No results for input(s): AMMONIA in the last 168 hours. Coagulation profile No results for input(s): INR, PROTIME in the last 168 hours.  CBC: Recent Labs  Lab 03/01/21 0128 03/01/21 0423 03/02/21 0640 03/03/21 0333 03/03/21 1739 03/04/21 0134 03/05/21 0150 03/06/21 0140  WBC 10.8* 9.2   < > 7.2 9.9 11.8* 8.0 8.7  NEUTROABS 6.1 4.7  --   --   --   --   --   --   HGB 10.7* 11.6*   < > 11.9* 12.1 13.1 10.6* 10.7*  HCT 32.6* 35.8*   < > 36.0 38.1 40.5 32.8* 33.4*  MCV 92.9 92.5   < > 91.1 92.3 92.9 91.9 91.0  PLT 154 139*   < > 187 191 203 184 227   < > = values in this interval not displayed.   Cardiac Enzymes: Recent Labs  Lab 03/01/21 0128  CKTOTAL 64   BNP (last 3 results) Recent Labs    09/01/20 1023 10/26/20 1137 02/21/21 1209  PROBNP 2,950*  2,308* 200.0*   CBG: Recent Labs  Lab 03/05/21 1124 03/05/21 1606 03/05/21 2057 03/06/21 0631 03/06/21 0841  GLUCAP 178* 273* 263* 207* 163*   D-Dimer: No results for input(s): DDIMER in the last 72 hours.  Hgb A1c: No results for input(s): HGBA1C in the last 72 hours.  Lipid  Profile: Recent Labs    03/04/21 0134  CHOL 104  HDL 38*  LDLCALC 35  TRIG 154*  CHOLHDL 2.7   Thyroid function studies: No results for input(s): TSH, T4TOTAL, T3FREE, THYROIDAB in the last 72 hours.  Invalid input(s): FREET3  Anemia work up: No results for input(s): VITAMINB12, FOLATE, FERRITIN, TIBC, IRON, RETICCTPCT in the last 72 hours.  Sepsis Labs: Recent Labs  Lab 03/03/21 1739 03/04/21 0134 03/05/21 0150 03/06/21 0140  WBC 9.9 11.8* 8.0 8.7    Microbiology Recent Results (from the past 240 hour(s))  Resp Panel by RT-PCR (Flu A&B, Covid) Nasopharyngeal Swab     Status: Abnormal   Collection Time: 02/28/21  6:41 PM   Specimen: Nasopharyngeal Swab; Nasopharyngeal(NP) swabs in vial transport medium  Result Value Ref Range Status   SARS Coronavirus 2 by RT PCR NEGATIVE NEGATIVE Final    Comment: (NOTE) SARS-CoV-2 target nucleic acids are NOT DETECTED.  The SARS-CoV-2 RNA is generally detectable in upper respiratory specimens during the acute phase of infection. The lowest concentration of SARS-CoV-2 viral copies this assay can detect is 138 copies/mL. A negative result does not preclude SARS-Cov-2 infection and should not be used as the sole basis for treatment or other patient management decisions. A negative result may occur with  improper specimen collection/handling, submission of specimen other than nasopharyngeal swab, presence of viral mutation(s) within the areas targeted by this assay, and inadequate number of viral copies(<138 copies/mL). A negative result must be combined with clinical observations, patient history, and epidemiological information. The expected  result is Negative.  Fact Sheet for Patients:  EntrepreneurPulse.com.au  Fact Sheet for Healthcare Providers:  IncredibleEmployment.be  This test is no t yet approved or cleared by the [place] FDA and  has been authorized for detection and/or diagnosis of SARS-CoV-2 by FDA under an Emergency Use Authorization (EUA). This EUA will remain  in effect (meaning this test can be used) for the duration of the COVID-19 declaration under Section 564(b)(1) of the Act, 21 U.S.C.section 360bbb-3(b)(1), unless the authorization is terminated  or revoked sooner.       Influenza A by PCR POSITIVE (A) NEGATIVE Final   Influenza B by PCR NEGATIVE NEGATIVE Final    Comment: (NOTE) The Xpert Xpress SARS-CoV-2/FLU/RSV plus assay is intended as an aid in the diagnosis of influenza from Nasopharyngeal swab specimens and should not be used as a sole basis for treatment. Nasal washings and aspirates are unacceptable for Xpert Xpress SARS-CoV-2/FLU/RSV testing.  Fact Sheet for Patients: EntrepreneurPulse.com.au  Fact Sheet for Healthcare Providers: IncredibleEmployment.be  This test is not yet approved or cleared by the [place] FDA and has been authorized for detection and/or diagnosis of SARS-CoV-2 by FDA under an Emergency Use Authorization (EUA). This EUA will remain in effect (meaning this test can be used) for the duration of the COVID-19 declaration under Section 564(b)(1) of the Act, 21 U.S.C. section 360bbb-3(b)(1), unless the authorization is terminated or revoked.  Performed at [facility], [place] [address] [place], [place] [address]   Surgical pcr screen     Status: Abnormal   Collection Time: 03/05/21 11:26 AM   Specimen: Nasal Mucosa; Nasal Swab  Result Value Ref Range Status   MRSA, PCR NEGATIVE NEGATIVE Final   Staphylococcus aureus POSITIVE (A) NEGATIVE Final    Comment: (NOTE) The Xpert  SA Assay (FDA approved for NASAL specimens in patients 48 years of age and older), is one component of a comprehensive surveillance program. It  is not intended to diagnose infection nor to guide or monitor treatment. Performed at [facility], [place] [address] [place], [place] [address]     Procedures and diagnostic studies:  No results found.  Medications:    [MAR Hold] aspirin EC  81 mg Oral Daily   [MAR Hold] bacitracin   Topical BID   [MAR Hold] docusate sodium  100 mg Oral BID   [MAR Hold] feeding supplement  237 mL Oral BID BM   [MAR Hold] heparin injection (subcutaneous)  5,000 Units Subcutaneous Q8H   [MAR Hold] insulin aspart  0-15 Units Subcutaneous TID WC   [MAR Hold] insulin aspart  0-5 Units Subcutaneous QHS   [MAR Hold] insulin glargine-yfgn  10 Units Subcutaneous QHS   [MAR Hold] levothyroxine  100 mcg Oral Q0600   [MAR Hold] metoprolol tartrate  25 mg Oral BID   [MAR Hold] multivitamin with minerals  1 tablet Oral Daily   [MAR Hold] pantoprazole  40 mg Oral Daily   [MAR Hold] sodium chloride flush  3 mL Intravenous Q12H   [MAR Hold] sodium chloride flush  3 mL Intravenous Q12H   Continuous Infusions:  [MAR Hold] sodium chloride     [MAR Hold] sodium chloride     ceFAZolin     lactated ringers 10 mL/hr at 03/06/21 0933     LOS: 5 days   Geradine Girt  [facility]   How to contact the [facility] Attending or Consulting provider [place] or covering provider during after hours [place], for this patient?  Check the care team in [facility] and look for a) attending/consulting [facility] provider listed and b) the [facility] team listed Log into [email or web address] and use [place]'s universal password to access. If you do not have the password, please contact the hospital operator. Locate the [facility] provider you are looking for under [facility] and page to a number that you can be directly reached. If you still have difficulty reaching the provider, please page the [facility]  (Director on Call) for the Hospitalists listed on amion for assistance.  03/06/2021, 11:13 AM

## 2021-03-06 NOTE — Anesthesia Procedure Notes (Signed)
Procedure Name: Intubation Date/Time: 03/06/2021 9:54 AM Performed by: Clearnce Sorrel, CRNA Pre-anesthesia Checklist: Patient identified, Emergency Drugs available, Suction available and Patient being monitored Patient Re-evaluated:Patient Re-evaluated prior to induction Oxygen Delivery Method: Circle System Utilized Preoxygenation: Pre-oxygenation with 100% oxygen Induction Type: IV induction, Cricoid Pressure applied and Rapid sequence Ventilation: Mask ventilation without difficulty Laryngoscope Size: Mac and 3 Grade View: Grade I Tube type: Oral Tube size: 7.0 mm Number of attempts: 1 Airway Equipment and Method: Stylet and Oral airway Placement Confirmation: ETT inserted through vocal cords under direct vision, positive ETCO2 and breath sounds checked- equal and bilateral Secured at: 23 cm Tube secured with: Tape Dental Injury: Teeth and Oropharynx as per pre-operative assessment

## 2021-03-06 NOTE — Care Management Important Message (Signed)
Important Message  Patient Details  Name: [name] MRN: [id number] Date of Birth: [birth year]   Medicare Important Message Given:  Yes     Shelda Altes 03/06/2021, 9:13 AM

## 2021-03-06 NOTE — Progress Notes (Signed)
Social work, please call daughter over husband. Daughter best to communicate with.   Chrisandra Carota, RN 03/06/2021 3:05 PM

## 2021-03-06 NOTE — Progress Notes (Signed)
°   03/06/21 1000  OT Visit Information  Last OT Received On 03/06/21  Assistance Needed +1  Reason Eval/Treat Not Completed Patient at procedure or test/ unavailable (OR)  History of Present Illness Pt is a 81 y.o. female admitted for  elevated troponin and leg weakness. Found to have AKI and NSTEMI. PMH includes CAD (s/p CABG, AVR), DM2, breast CA, CKD IV, HTN, OA, PAD, RA, stenosis., neuropathy, Covid.    Plan to reattempt at a later date/time.  Tyrone Schimke, [place] Acute Rehabilitation Services Office: [phone]

## 2021-03-06 NOTE — Progress Notes (Addendum)
Inpatient Diabetes Program Recommendations  AACE/ADA: New Consensus Statement on Inpatient Glycemic Control (2015)  Target Ranges:  Prepandial:   less than 140 mg/dL      Peak postprandial:   less than 180 mg/dL (1-2 hours)      Critically ill patients:  140 - 180 mg/dL   Lab Results  Component Value Date   GLUCAP 163 (H) 03/06/2021   HGBA1C 11.6 (H) 03/01/2021    Review of Glycemic Control  Latest Reference Range & Units 03/05/21 06:39 03/05/21 11:24 03/05/21 16:06 03/05/21 20:57 03/06/21 06:31 03/06/21 08:41  Glucose-Capillary 70 - 99 mg/dL 256 (H) 178 (H) 273 (H) 263 (H) 207 (H) 163 (H)   Diabetes history: DM 2 Outpatient Diabetes medications:Lantus 10 units daily (added on 2/1), Jardiance 10 mg daily, Glucotrol 10 mg daily, Ozempic 1.25 mg weekly Current orders for Inpatient glycemic control:  Novolog 0-15 units tid + hs  Semglee 10 units qhs  Ensure Enlive bid between meals Decadron 5 mg given during surgery today  Inpatient Diabetes Program Recommendations:    -  Add Novolog 4 units tid meal coverage if eating >50% of meals  May need additional basal insulin today to cover decadron dose.  Thanks,  Tama Headings RN, MSN, BC-ADM Inpatient Diabetes Coordinator Team Pager [phone] (8a-5p)

## 2021-03-06 NOTE — Anesthesia Preprocedure Evaluation (Addendum)
Anesthesia Evaluation  Patient identified by MRN, date of birth, ID band Patient awake    Reviewed: Allergy & Precautions, NPO status , Patient's Chart, lab work & pertinent test results, reviewed documented beta blocker date and time   History of Anesthesia Complications Negative for: history of anesthetic complications  Airway Mallampati: II  TM Distance: >3 FB Neck ROM: Full    Dental  (+) Edentulous Upper, Missing, Dental Advisory Given   Pulmonary resolved, Recent URI  (flu 12/2020), Resolved, former smoker,    breath sounds clear to auscultation       Cardiovascular hypertension, Pt. on medications and Pt. on home beta blockers + CAD, + Cardiac Stents, + CABG and + Peripheral Vascular Disease  + Valvular Problems/Murmurs (s/p AVR)  Rhythm:Regular Rate:Normal   '23 TTE - EF 55 to 60%. Grade I diastolic dysfunction (impaired relaxation). Mild mitral valve regurgitation. 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 Not well  visualized, no significant paravalvular leak.      Neuro/Psych  Headaches, PSYCHIATRIC DISORDERS Anxiety Depression    GI/Hepatic Neg liver ROS, GERD  Medicated and Controlled,  Endo/Other  diabetes (glu 163), Type 2, Oral Hypoglycemic Agents, Insulin DependentHypothyroidism   Renal/GU CRFRenal disease     Musculoskeletal  (+) Arthritis ,   Abdominal   Peds  Hematology  (+) Blood dyscrasia (Hb 10.7), anemia ,   Anesthesia Other Findings HSV  Reproductive/Obstetrics                          Anesthesia Physical Anesthesia Plan  ASA: 4  Anesthesia Plan: General   Post-op Pain Management: Tylenol PO (pre-op)   Induction: Intravenous  PONV Risk Score and Plan: 3 and Treatment may vary due to age or medical condition, Ondansetron and Dexamethasone  Airway Management Planned: Oral ETT  Additional Equipment: None  Intra-op Plan:   Post-operative Plan:  Extubation in OR  Informed Consent: I have reviewed the patients History and Physical, chart, labs and discussed the procedure including the risks, benefits and alternatives for the proposed anesthesia with the patient or authorized representative who has indicated his/her understanding and acceptance.   Patient has DNR.  Discussed DNR with patient and Suspend DNR.   Dental advisory given  Plan Discussed with: CRNA and Surgeon  Anesthesia Plan Comments:        Anesthesia Quick Evaluation

## 2021-03-06 NOTE — Progress Notes (Signed)
Patient arrived back to 4E from PACU. Vitals taken and stable. Left leg AKA assessed. Patient asleep. Family at bedside. [name]

## 2021-03-06 NOTE — Transfer of Care (Signed)
Immediate Anesthesia Transfer of Care Note  Patient: [name]  Procedure(s) Performed: LEFT ABOVE KNEE AMPUTATION (Left: Knee)  Patient Location: PACU  Anesthesia Type:General  Level of Consciousness: drowsy  Airway & Oxygen Therapy: Patient Spontanous Breathing  Post-op Assessment: Report given to RN and Post -op Vital signs reviewed and stable  Post vital signs: Reviewed and stable  Last Vitals:  Vitals Value Taken Time  BP    Temp    Pulse    Resp    SpO2      Last Pain:  Vitals:   03/06/21 0930  TempSrc: Oral  PainSc: 4       Patients Stated Pain Goal: 0 (01/49/96 9249)  Complications: No notable events documented.

## 2021-03-07 ENCOUNTER — Encounter (HOSPITAL_COMMUNITY): Payer: Self-pay | Admitting: Vascular Surgery

## 2021-03-07 DIAGNOSIS — J101 Influenza due to other identified influenza virus with other respiratory manifestations: Secondary | ICD-10-CM | POA: Diagnosis not present

## 2021-03-07 LAB — BASIC METABOLIC PANEL
Anion gap: 10 (ref 5–15)
BUN: 25 mg/dL — ABNORMAL HIGH (ref 8–23)
CO2: 22 mmol/L (ref 22–32)
Calcium: 8.7 mg/dL — ABNORMAL LOW (ref 8.9–10.3)
Chloride: 102 mmol/L (ref 98–111)
Creatinine, Ser: 1.69 mg/dL — ABNORMAL HIGH (ref 0.44–1.00)
GFR, Estimated: 31 mL/min — ABNORMAL LOW (ref >60)
Glucose, Bld: 250 mg/dL — ABNORMAL HIGH (ref 70–99)
Potassium: 5.1 mmol/L (ref 3.5–5.1)
Sodium: 134 mmol/L — ABNORMAL LOW (ref 135–145)

## 2021-03-07 LAB — CBC
HCT: 32 % — ABNORMAL LOW (ref 36.0–46.0)
Hemoglobin: 10.2 g/dL — ABNORMAL LOW (ref 12.0–15.0)
MCH: 29.3 pg (ref 26.0–34.0)
MCHC: 31.9 g/dL (ref 30.0–36.0)
MCV: 92 fL (ref 80.0–100.0)
Platelets: 230 10*3/uL (ref 150–400)
RBC: 3.48 MIL/uL — ABNORMAL LOW (ref 3.87–5.11)
RDW: 15.4 % (ref 11.5–15.5)
WBC: 12.5 10*3/uL — ABNORMAL HIGH (ref 4.0–10.5)
nRBC: 0 % (ref 0.0–0.2)

## 2021-03-07 LAB — GLUCOSE, CAPILLARY
Glucose-Capillary: 223 mg/dL — ABNORMAL HIGH (ref 70–99)
Glucose-Capillary: 245 mg/dL — ABNORMAL HIGH (ref 70–99)
Glucose-Capillary: 325 mg/dL — ABNORMAL HIGH (ref 70–99)
Glucose-Capillary: 233 mg/dL — ABNORMAL HIGH (ref 70–99)

## 2021-03-07 MED ORDER — MUPIROCIN 2 % EX OINT
1.0000 "application " | TOPICAL_OINTMENT | Freq: Two times a day (BID) | CUTANEOUS | Status: AC
  Administered 2021-03-07 – 2021-03-11 (×10): 1 via NASAL
  Filled 2021-03-07 (×3): qty 22

## 2021-03-07 MED ORDER — INSULIN ASPART 100 UNIT/ML IJ SOLN
4.0000 [IU] | Freq: Three times a day (TID) | INTRAMUSCULAR | Status: DC
  Administered 2021-03-07 – 2021-03-08 (×3): 4 [IU] via SUBCUTANEOUS

## 2021-03-07 NOTE — Progress Notes (Signed)
No new recommendations. Will sign off. Call with questions.

## 2021-03-07 NOTE — Progress Notes (Signed)
Physical Therapy Treatment Patient Details Name: [name] MRN: [id number] DOB: August 24, 1940 Today's Date: 03/07/2021   History of Present Illness Pt is a 81 y.o. female admitted for  elevated troponin and leg weakness. Found to have AKI and NSTEMI. 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 PMH includes CAD (s/p CABG, AVR), DM2, breast CA, CKD IV, HTN, OA, PAD, RA, stenosis., neuropathy, Covid.    PT Comments    Patient presents s/p left AKA and presents with pain, impaired balance and impaired mobility s/p above. Pt requires Min A for bed mobility due to trying to find new CoM and Min guard assist for lateral scoot transfer to chair with setup assist and cues for technique. Education on phantom limb pain, desensitization techniques, there ex, positioning, pressure relief techniques etc. Would benefit from AIR to maximize independence and mobility prior to return home. Pt highly motivated and has good family support. Will follow acutely.   Recommendations for follow up therapy are one component of a multi-disciplinary discharge planning process, led by the attending physician.  Recommendations may be updated based on patient status, additional functional criteria and insurance authorization.  Follow Up Recommendations  Acute inpatient rehab (3hours/day)     Assistance Recommended at Discharge Frequent or constant Supervision/Assistance  Patient can return home with the following Help with stairs or ramp for entrance;Assist for transportation;Assistance with cooking/housework;A little help with walking and/or transfers;A little help with bathing/dressing/bathroom   Equipment Recommendations  Wheelchair (measurements PT);Wheelchair cushion (measurements PT);BSC/3in1 (drop arm)    Recommendations for Other Services       Precautions / Restrictions Precautions Precautions: Fall;Other (comment) Precaution Comments: left AKA Restrictions Weight Bearing Restrictions: Yes LLE Weight Bearing: Non weight  bearing     Mobility  Bed Mobility Overal bed mobility: Needs Assistance Bed Mobility: Supine to Sit     Supine to sit: Min assist, HOB elevated     General bed mobility comments: Min A to stabilize trunk and get to EOB due to poor balance, needing UEs on rail for support.    Transfers Overall transfer level: Needs assistance Equipment used: None Transfers: Bed to chair/wheelchair/BSC            Lateral/Scoot Transfers: Min guard General transfer comment: Lateral scoot transfer towards right with cues for technique, no LOB. MIld dizziness but VSS.    Ambulation/Gait               General Gait Details: Deferred   Stairs             Wheelchair Mobility    Modified Rankin (Stroke Patients Only)       Balance Overall balance assessment: Needs assistance Sitting-balance support: Single extremity supported (foot supported) Sitting balance-Leahy Scale: Poor Sitting balance - Comments: Requires at least 1 UE support due to impaired dynamic sitting balance, LOB a few times posteriorly with scooting. Postural control: Posterior lean                                  Cognition Arousal/Alertness: Awake/alert Behavior During Therapy: WFL for tasks assessed/performed Overall Cognitive Status: Within Functional Limits for tasks assessed                                          Exercises Amputee Exercises Hip Extension: AROM, Left, 5 reps, Supine Hip ABduction/ADduction: AROM,  Left, 5 reps, Supine Hip Flexion/Marching: AROM, Left, 5 reps, Supine Chair Push Up: Both, 5 reps, Seated    General Comments General comments (skin integrity, edema, etc.): VSS on RA. Asking for foam pad to place on bottom due to it hurting, RN nurse notified.      Pertinent Vitals/Pain Pain Assessment Pain Assessment: Faces Faces Pain Scale: Hurts a little bit Pain Location: left residual limb with movement Pain Descriptors / Indicators:  Sore Pain Intervention(s): Monitored during session, Premedicated before session    Home Living                          Prior Function            PT Goals (current goals can now be found in the care plan section) Acute Rehab PT Goals Patient Stated Goal: to go to rehab and get a prosthesis PT Goal Formulation: With patient Time For Goal Achievement: 03/21/21 Potential to Achieve Goals: Good Progress towards PT goals: Progressing toward goals    Frequency    Min 3X/week      PT Plan Discharge plan needs to be updated    Co-evaluation              AM-PAC PT "6 Clicks" Mobility   Outcome Measure  Help needed turning from your back to your side while in a flat bed without using bedrails?: A Little Help needed moving from lying on your back to sitting on the side of a flat bed without using bedrails?: A Little Help needed moving to and from a bed to a chair (including a wheelchair)?: A Little Help needed standing up from a chair using your arms (e.g., wheelchair or bedside chair)?: A Lot Help needed to walk in hospital room?: Total Help needed climbing 3-5 steps with a railing? : Total 6 Click Score: 13    End of Session   Activity Tolerance: Patient tolerated treatment well Patient left: in chair;with call bell/phone within reach;with family/visitor present Nurse Communication: Mobility status;Patient requests pain meds;Other (comment) (foam pad for bottom) PT Visit Diagnosis: Other abnormalities of gait and mobility (R26.89);Pain;Unsteadiness on feet (R26.81) Pain - Right/Left: Left Pain - part of body: Leg     Time: 0174-9449 PT Time Calculation (min) (ACUTE ONLY): 25 min  Charges:  $Therapeutic Activity: 8-22 mins  1 Re-eval charge                     Zettie Cooley, DPT Acute Rehabilitation Services Pager [phone] Office [phone]      Marguarite Arbour A Sabra Heck 03/07/2021, 9:23 AM

## 2021-03-07 NOTE — Progress Notes (Signed)
Inpatient Rehab Admissions Coordinator:   Spoke to patient and her daughter over the phone to review goals/expectations of CIR stay.  Both in agreement with plan to pursue insurance authorization and I will start that process today.  I did let them know there was a chance we would have to pursue an appeal if insurance denied initial request and they were on board with that.  I will start insurance request and follow for determination.   Shann Medal, PT, DPT Admissions Coordinator [phone] 03/07/21  12:04 PM

## 2021-03-07 NOTE — Progress Notes (Signed)
Progress Note    MARCELYN RUPPE  TDD:[id number] DOB: [birth year]  DOA: 02/28/2021 PCP: [name], MD    Brief Narrative:     Medical records reviewed and are as summarized below:  [name] is an 81 y.o. female with medical history significant of DM2, HTN HLD, CAD sp CABG recent Covid.  Admitted for elevated troponin and leg weakness.  Found to have worsening ABIs and non-healing foot wounds as well as NSTEMI.  NSTEMI treated medically.   S/p repeat angiogram and amputation by vascular.  CIR placement?   Assessment/Plan:   Principal Problem:   Influenza A Active Problems:   Hypothyroidism   Type 2 diabetes mellitus with diabetic neuropathy, without long-term current use of insulin (HCC)   Essential hypertension   Coronary atherosclerosis   GERD   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 female breast ([place])   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HCC)   PAD (peripheral artery disease) (HCC)   Hyperlipidemia   Diabetic peripheral neuropathy (HCC)   Bilateral lower extremity edema   Hyponatremia   Lower extremity weakness   Troponin level elevated   Diplopia   Leg weakness   Diplopia/LE weakness/reported "clumsiness"  -MRI brain negative -resolved symptoms -suspect related to [facility]  PAD (peripheral artery disease) (HCC) Bilateral claudication  -ABI abnormal -vascular consult- s/p angiogram and then amputation on 2/13   Influenza A Pt with recent infection - do not think active infection   Hypothyroidism -  continue home medications at current dose TSH slightly elevated   Type 2 diabetes mellitus with diabetic neuropathy, without long-term current use of insulin (HCC)  - SSI  continue lantus -add meal coverage  Essential hypertension Continue metoprolol -adjust BP medications as needed   Bilateral lower extremity edema Possible side effect of lyrica Negative for DVT   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 female  breast ([place]) Remote stable   GERD Continue PPI   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HCC)  -chronic avoid nephrotoxic medications such as NSAIDs, Vanco/Zosyn combo,  avoid hypotension, continue to follow renal function   Hyperlipidemia Stable cont home meds   Lower extremity weakness Unclear etiology, given recent viral illness GBS is on differential but unlikley given Reflexes intact and patient reports improvement overnight -MRI lumbar spine done -Other differential includes reaction to lyrica will hold -PT/OT assessment   Diabetic peripheral neuropathy ([place]) Hold lyrica (per cards notes patient's issues started when lyrica started 2/4)   Hyponatremia -resolved   Elevated troponin/NSTEMI  -no chest pain no EKG changes in the setting of  chronic kidney disease likely due to demand ischemia and poor clearance, monitor on telemetry and cycle cardiac enzymes to trend. -heparin gtt x 48 hours -cards has signed off   Coronary atherosclerosis  - chronic, continue aspirin and beta blocker -cards following        Family Communication/Anticipated D/C date and plan/Code Status   DVT prophylaxis: heparin gtt x 48 hours now heparin sq Code Status: DNR  Daughter updated via phone Disposition Plan: Status is: inpt- CIR placement vs SNF    Medical Consultants:   Cards Vascular CIR  Subjective:   Feeling at peace with her surgery  Objective:    Vitals:   03/06/21 2054 03/06/21 2316 03/07/21 0347 03/07/21 0744  BP:  (!) 151/76 (!) 143/64 (!) 161/70  Pulse:  (!) 105 93 98  Resp:  15 18 17   Temp:  97.9 F (36.6 C) 97.8 F (36.6 C) 97.9 F (36.6 C)  TempSrc:  Oral Oral Oral  SpO2:  96% 100% 100%  Weight:      Height: 5\' 3"  (1.6 m)       Intake/Output Summary (Last 24 hours) at 03/07/2021 1133 Last data filed at 03/07/2021 5277 Gross per 24 hour  Intake 1039.86 ml  Output 600 ml  Net 439.86 ml   Filed Weights   02/28/21 1900  Weight: 66.7 kg     Exam:   General: Appearance:     Overweight female in no acute distress     Lungs:     respirations unlabored  Heart:    Normal heart rate. Normal rhythm. No murmurs, rubs, or gallops.    MS:   Left above knee amputation noted.    Neurologic:   Awake, alert       Data Reviewed:   I have personally reviewed following labs and imaging studies:  Labs: Labs show the following:   Basic Metabolic Panel: Recent Labs  Lab 03/01/21 0128 03/01/21 0423 03/02/21 0042 03/03/21 0333 03/03/21 1739 03/04/21 0134 03/05/21 0150 03/06/21 0140 03/07/21 0137  NA 135 136   < > 134*  --  136 134* 135 134*  K 3.5 3.4*   < > 4.2  --  4.4 4.2 4.3 5.1  CL 102 101   < > 99  --  102 102 101 102  CO2 21* 24   < > 23  --  21* 24 23 22   GLUCOSE 275* 183*   < > 278*  --  217* 303* 252* 250*  BUN 32* 30*   < > 26*  --  23 28* 24* 25*  CREATININE 1.76* 1.70*   < > 1.62* 1.56* 1.72* 1.63* 1.64* 1.69*  CALCIUM 9.1 9.1   < > 8.7*  --  9.2 8.7* 9.1 8.7*  MG 1.7 1.8  --   --   --   --   --   --   --   PHOS 3.7 3.9  --   --   --   --   --   --   --    < > = values in this interval not displayed.   GFR Estimated Creatinine Clearance: 25.2 mL/min (A) (by C-G formula based on SCr of 1.69 mg/dL (H)). Liver Function Tests: Recent Labs  Lab 03/01/21 0128 03/01/21 0423  AST 18 19  ALT 18 18  ALKPHOS 73 66  BILITOT 0.7 0.5  PROT 5.6* 5.8*  ALBUMIN 2.7* 2.8*   No results for input(s): LIPASE, AMYLASE in the last 168 hours. No results for input(s): AMMONIA in the last 168 hours. Coagulation profile No results for input(s): INR, PROTIME in the last 168 hours.  CBC: Recent Labs  Lab 03/01/21 0128 03/01/21 0423 03/02/21 0640 03/03/21 1739 03/04/21 0134 03/05/21 0150 03/06/21 0140 03/07/21 0137  WBC 10.8* 9.2   < > 9.9 11.8* 8.0 8.7 12.5*  NEUTROABS 6.1 4.7  --   --   --   --   --   --   HGB 10.7* 11.6*   < > 12.1 13.1 10.6* 10.7* 10.2*  HCT 32.6* 35.8*   < > 38.1 40.5 32.8* 33.4* 32.0*   MCV 92.9 92.5   < > 92.3 92.9 91.9 91.0 92.0  PLT 154 139*   < > 191 203 184 227 230   < > = values in this interval not displayed.  Cardiac Enzymes: Recent Labs  Lab 03/01/21 0128  CKTOTAL 64   BNP (last 3 results) Recent Labs    09/01/20 1023 10/26/20 1137 02/21/21 1209  PROBNP 2,950* 2,308* 200.0*   CBG: Recent Labs  Lab 03/06/21 1115 03/06/21 1235 03/06/21 1704 03/06/21 2100 03/07/21 0605  GLUCAP 227* 290* 338* 306* 223*   D-Dimer: No results for input(s): DDIMER in the last 72 hours.  Hgb A1c: No results for input(s): HGBA1C in the last 72 hours.  Lipid Profile: No results for input(s): CHOL, HDL, LDLCALC, TRIG, CHOLHDL, LDLDIRECT in the last 72 hours.  Thyroid function studies: No results for input(s): TSH, T4TOTAL, T3FREE, THYROIDAB in the last 72 hours.  Invalid input(s): FREET3  Anemia work up: No results for input(s): VITAMINB12, FOLATE, FERRITIN, TIBC, IRON, RETICCTPCT in the last 72 hours.  Sepsis Labs: Recent Labs  Lab 03/04/21 0134 03/05/21 0150 03/06/21 0140 03/07/21 0137  WBC 11.8* 8.0 8.7 12.5*    Microbiology Recent Results (from the past 240 hour(s))  Resp Panel by RT-PCR (Flu A&B, Covid) Nasopharyngeal Swab     Status: Abnormal   Collection Time: 02/28/21  6:41 PM   Specimen: Nasopharyngeal Swab; Nasopharyngeal(NP) swabs in vial transport medium  Result Value Ref Range Status   SARS Coronavirus 2 by RT PCR NEGATIVE NEGATIVE Final    Comment: (NOTE) SARS-CoV-2 target nucleic acids are NOT DETECTED.  The SARS-CoV-2 RNA is generally detectable in upper respiratory specimens during the acute phase of infection. The lowest concentration of SARS-CoV-2 viral copies this assay can detect is 138 copies/mL. A negative result does not preclude SARS-Cov-2 infection and should not be used as the sole basis for treatment or other patient management decisions. A negative result may occur with  improper specimen collection/handling,  submission of specimen other than nasopharyngeal swab, presence of viral mutation(s) within the areas targeted by this assay, and inadequate number of viral copies(<138 copies/mL). A negative result must be combined with clinical observations, patient history, and epidemiological information. The expected result is Negative.  Fact Sheet for Patients:  EntrepreneurPulse.com.au  Fact Sheet for Healthcare Providers:  IncredibleEmployment.be  This test is no t yet approved or cleared by the [place] FDA and  has been authorized for detection and/or diagnosis of SARS-CoV-2 by FDA under an Emergency Use Authorization (EUA). This EUA will remain  in effect (meaning this test can be used) for the duration of the COVID-19 declaration under Section 564(b)(1) of the Act, 21 U.S.C.section 360bbb-3(b)(1), unless the authorization is terminated  or revoked sooner.       Influenza A by PCR POSITIVE (A) NEGATIVE Final   Influenza B by PCR NEGATIVE NEGATIVE Final    Comment: (NOTE) The Xpert Xpress SARS-CoV-2/FLU/RSV plus assay is intended as an aid in the diagnosis of influenza from Nasopharyngeal swab specimens and should not be used as a sole basis for treatment. Nasal washings and aspirates are unacceptable for Xpert Xpress SARS-CoV-2/FLU/RSV testing.  Fact Sheet for Patients: EntrepreneurPulse.com.au  Fact Sheet for Healthcare Providers: IncredibleEmployment.be  This test is not yet approved or cleared by the [place] FDA and has been authorized for detection and/or diagnosis of SARS-CoV-2 by FDA under an Emergency Use Authorization (EUA). This EUA will remain in effect (meaning this test can be used) for the duration of the COVID-19 declaration under Section 564(b)(1) of the Act, 21 U.S.C. section 360bbb-3(b)(1), unless the authorization is terminated or revoked.  Performed at [facility],  [place] [address] [place], [place] [address]  Surgical pcr screen     Status: Abnormal   Collection Time: 03/05/21 11:26 AM   Specimen: Nasal Mucosa; Nasal Swab  Result Value Ref Range Status   MRSA, PCR NEGATIVE NEGATIVE Final   Staphylococcus aureus POSITIVE (A) NEGATIVE Final    Comment: (NOTE) The Xpert SA Assay (FDA approved for NASAL specimens in patients 65 years of age and older), is one component of a comprehensive surveillance program. It is not intended to diagnose infection nor to guide or monitor treatment. Performed at [facility], [place] [address] [place], [place] [address]     Procedures and diagnostic studies:  No results found.  Medications:    aspirin EC  81 mg Oral Daily   bacitracin   Topical BID   docusate sodium  100 mg Oral BID   feeding supplement  237 mL Oral BID BM   heparin  5,000 Units Subcutaneous Q8H   insulin aspart  0-15 Units Subcutaneous TID WC   insulin aspart  0-5 Units Subcutaneous QHS   insulin aspart  4 Units Subcutaneous TID WC   insulin glargine-yfgn  10 Units Subcutaneous QHS   levothyroxine  100 mcg Oral Q0600   metoprolol tartrate  25 mg Oral BID   multivitamin with minerals  1 tablet Oral Daily   pantoprazole  40 mg Oral Daily   sodium chloride flush  3 mL Intravenous Q12H   sodium chloride flush  3 mL Intravenous Q12H   traMADol  50 mg Oral Q12H   Continuous Infusions:  sodium chloride     sodium chloride     magnesium sulfate bolus IVPB       LOS: 6 days   Geradine Girt  [facility]   How to contact the [facility] Attending or Consulting provider 7A - 7P or covering provider during after hours [place], for this patient?  Check the care team in [facility] and look for a) attending/consulting TRH provider listed and b) the [facility] team listed Log into [email or web address] and use [place] universal password to access. If you do not have the password, please contact the hospital operator. Locate the [facility] provider you are looking  for under [facility] and page to a number that you can be directly reached. If you still have difficulty reaching the provider, please page the [facility] (Director on Call) for the Hospitalists listed on amion for assistance.  03/07/2021, 11:33 AM

## 2021-03-07 NOTE — Evaluation (Signed)
Occupational Therapy RE-Evaluation Patient Details Name: [name] MRN: [id number] DOB: [birth year] Today's Date: 03/07/2021   History of Present Illness Pt is a 81 y.o. female admitted for  elevated troponin and leg weakness. Found to have AKI and NSTEMI. 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 PMH includes CAD (s/p CABG, AVR), DM2, breast CA, CKD IV, HTN, OA, PAD, RA, stenosis, neuropathy, Covid.   Clinical Impression   81 yo female underwent L AKA 03/06/21 with change in status since initial evaluation. Seen for re-evaluation this date, demonstrating decline in indep with transfers/mobility and all ADLs. She is in good spirits and motivated to participate with OT. Reports she was up in chair for approx 3 hours and needing to return to bed. Mod A for squat pivot transfer with report of increased pain following transfer. Pain did subside with rest and repositioning. Note some skin irritation and educated pt in performing pressure relief and repositioning in side-lying to offload buttocks. RN made aware of skin irritation. Pt will benefit from continued OT to address deficits and progress mobility as well as begin pre-prosthetic training. Pt left supine with needs met and RN aware. Recommend d/c to intensive rehab program to progress back to prior level of function.     Recommendations for follow up therapy are one component of a multi-disciplinary discharge planning process, led by the attending physician.  Recommendations may be updated based on patient status, additional functional criteria and insurance authorization.   Follow Up Recommendations  Acute inpatient rehab (3hours/day)    Assistance Recommended at Discharge Frequent or constant Supervision/Assistance  Patient can return home with the following A lot of help with walking and/or transfers;A lot of help with bathing/dressing/bathroom    Functional Status Assessment  Patient has had a recent decline in their functional status and demonstrates  the ability to make significant improvements in function in a reasonable and predictable amount of time.  Equipment Recommendations  BSC/3in1;Tub/shower bench;Other (comment);Wheelchair cushion (measurements OT);Wheelchair (measurements OT) (to be determined at inpatient rehab)       Precautions / Restrictions Precautions Precautions: Fall;Other (comment) Precaution Comments: left AKA Restrictions Weight Bearing Restrictions: Yes LLE Weight Bearing: Non weight bearing      Mobility Bed Mobility Overal bed mobility: Needs Assistance Bed Mobility: Sit to Supine        Transfers Overall transfer level: Needs assistance Equipment used: None Transfers: Bed to chair/wheelchair/BSC Sit to Stand: Min assist                  ADL either performed or assessed with clinical judgement   ADL Overall ADL's : Needs assistance/impaired Eating/Feeding: Modified independent   Grooming: Set up;Sitting   Upper Body Bathing: Set up   Lower Body Bathing: Minimal assistance;Sitting/lateral leans   Upper Body Dressing : Set up   Lower Body Dressing: Minimal assistance;Sitting/lateral leans   Toilet Transfer: BSC/3in1;Moderate assistance   Toileting- Clothing Manipulation and Hygiene: Minimal assistance       Functional mobility during ADLs: Moderate assistance;Cueing for safety       Vision Baseline Vision/History: 1 Wears glasses;4 Cataracts Vision Assessment?: No apparent visual deficits            Pertinent Vitals/Pain Pain Assessment Pain Assessment: 0-10 Pain Score: 6  Pain Location: left residual limb with movement Pain Descriptors / Indicators: Stabbing, Tender Pain Intervention(s): Limited activity within patient's tolerance, Monitored during session, Repositioned, Relaxation     Hand Dominance Right   Extremity/Trunk Assessment Upper Extremity Assessment Upper Extremity Assessment: Overall  WFL for tasks assessed           Communication  Communication Communication: HOH   Cognition Arousal/Alertness: Awake/alert Behavior During Therapy: WFL for tasks assessed/performed Overall Cognitive Status: Within Functional Limits for tasks assessed                   General Comments  reports itching and rash to RLE, BUEs and note irritation to bottom. notified RN and educated pt in need for pressure relief            Home Living Family/patient expects to be discharged to:: Private residence Living Arrangements: Spouse/significant other Available Help at Discharge: Family;Available 24 hours/day Type of Home: House Home Access: Stairs to enter [employer] of Steps: 5 Entrance Stairs-Rails: Right;Left Home Layout: One level     Bathroom Shower/Tub: Teacher, early years/pre: Handicapped height Bathroom Accessibility: Yes   Home Equipment: Conservation officer, nature (2 wheels);Other (comment) (reports this was her mother's walker so may benefit from getting new equipment)          Prior Functioning/Environment Prior Level of Function : Independent/Modified Independent;Driving       ADLs Comments: Independent without DME        OT Problem List: Decreased strength;Decreased activity tolerance;Impaired balance (sitting and/or standing);Decreased knowledge of use of DME or AE;Impaired sensation;Pain      OT Treatment/Interventions: Self-care/ADL training;Therapeutic exercise;DME and/or AE instruction;Therapeutic activities;Patient/family education;Balance training    OT Goals(Current goals can be found in the care plan section) Acute Rehab OT Goals Patient Stated Goal: Increase indep with ADLs and mobility, return to gardening and canning OT Goal Formulation: With patient/family Time For Goal Achievement: 03/21/21 Potential to Achieve Goals: Good ADL Goals Pt Will Transfer to Toilet: with modified independence;stand pivot transfer;bedside commode  OT Frequency: Min 3X/week       AM-PAC OT "6  Clicks" Daily Activity     Outcome Measure Help from another person eating meals?: None Help from another person taking care of personal grooming?: A Little Help from another person toileting, which includes using toliet, bedpan, or urinal?: A Lot Help from another person bathing (including washing, rinsing, drying)?: A Lot Help from another person to put on and taking off regular upper body clothing?: A Little Help from another person to put on and taking off regular lower body clothing?: A Lot 6 Click Score: 16   End of Session Equipment Utilized During Treatment: Other (comment) (hand held assist) Nurse Communication: Mobility status;Other (comment) (benefit of getting drop arm BSC for use while in hospital)  Activity Tolerance: Patient limited by pain;Patient limited by fatigue Patient left: in bed;with call bell/phone within reach  OT Visit Diagnosis: Unsteadiness on feet (R26.81);Other abnormalities of gait and mobility (R26.89);Muscle weakness (generalized) (M62.81);Pain Pain - Right/Left: Left Pain - part of body: Leg                Time: 8022-3361 OT Time Calculation (min): 35 min Charges:  OT General Charges $OT Visit: 1 Visit OT Evaluation $OT Re-eval: 1 Re-eval OT Treatments $Therapeutic Activity: 8-22 mins   Naomie Dean Emery Dupuy, OTR/L 03/07/2021, 12:46 PM

## 2021-03-07 NOTE — Progress Notes (Addendum)
Progress Note    03/07/2021 7:27 AM 1 Day Post-Op  Subjective:  pain is well controlled  Afebrile  Vitals:   03/06/21 2316 03/07/21 0347  BP: (!) 151/76 (!) 143/64  Pulse: (!) 105 93  Resp: 15 18  Temp: 97.9 F (36.6 C) 97.8 F (36.6 C)  SpO2: 96% 100%    Physical Exam: Incisions:  bandage in place with ioban.    CBC    Component Value Date/Time   WBC 12.5 (H) 03/07/2021 0137   RBC 3.48 (L) 03/07/2021 0137   HGB 10.2 (L) 03/07/2021 0137   HGB 12.7 08/24/2020 1005   HGB 12.0 02/21/2016 1035   HCT 32.0 (L) 03/07/2021 0137   HCT 38.8 08/24/2020 1005   HCT 36.4 02/21/2016 1035   PLT 230 03/07/2021 0137   PLT 226 08/24/2020 1005   MCV 92.0 03/07/2021 0137   MCV 88 08/24/2020 1005   MCV 87.4 02/21/2016 1035   MCH 29.3 03/07/2021 0137   MCHC 31.9 03/07/2021 0137   RDW 15.4 03/07/2021 0137   RDW 13.3 08/24/2020 1005   RDW 14.2 02/21/2016 1035   LYMPHSABS 3.1 03/01/2021 0423   LYMPHSABS 2.6 08/24/2020 1005   LYMPHSABS 2.6 02/21/2016 1035   MONOABS 1.2 (H) 03/01/2021 0423   MONOABS 0.7 02/21/2016 1035   EOSABS 0.2 03/01/2021 0423   EOSABS 0.5 (H) 08/24/2020 1005   BASOSABS 0.0 03/01/2021 0423   BASOSABS 0.1 08/24/2020 1005   BASOSABS 0.1 02/21/2016 1035    BMET    Component Value Date/Time   NA 134 (L) 03/07/2021 0137   NA 138 10/26/2020 1137   NA 138 02/21/2016 1035   K 5.1 03/07/2021 0137   K 4.5 02/21/2016 1035   CL 102 03/07/2021 0137   CL 106 04/10/2012 0921   CO2 22 03/07/2021 0137   CO2 24 02/21/2016 1035   GLUCOSE 250 (H) 03/07/2021 0137   GLUCOSE 194 (H) 02/21/2016 1035   GLUCOSE 216 (H) 04/10/2012 0921   BUN 25 (H) 03/07/2021 0137   BUN 32 (H) 10/26/2020 1137   BUN 18.7 02/21/2016 1035   CREATININE 1.69 (H) 03/07/2021 0137   CREATININE 1.4 (H) 02/21/2016 1035   CALCIUM 8.7 (L) 03/07/2021 0137   CALCIUM 9.0 02/21/2016 1035   GFRNONAA 31 (L) 03/07/2021 0137   GFRAA 32 (L) 03/11/2020 0913    INR    Component Value Date/Time   INR  1.4 (H) 05/04/2020 1544     Intake/Output Summary (Last 24 hours) at 03/07/2021 0727 Last data filed at 03/07/2021 0548 Gross per 24 hour  Intake 1199.86 ml  Output 500 ml  Net 699.86 ml     Assessment/Plan:  81 y.o. female is s/p left above knee amputation  1 Day Post-Op  -pt doing well this morning and pain well controlled. -will take down dressing either Friday or earlier if pt is able to discharge prior to that.     Leontine Locket, PA-C [facility] [phone] 03/07/2021 7:27 AM   I have seen and evaluated the patient. I agree with the PA note as documented above.  Postop day 1 status post left above-knee amputation for critical limb ischemia with tissue loss and no options for revascularization.  Rest pain in the left foot is much improved.  She appears comfortable.  Dressing dry.  Discussed we will remove dressing in several days.  Looks good from my standpoint.  Hemoglobin and creatinine stable.  Marty Heck, MD [facility]  Office: [phone]

## 2021-03-08 ENCOUNTER — Telehealth: Payer: Self-pay | Admitting: Family Medicine

## 2021-03-08 DIAGNOSIS — I739 Peripheral vascular disease, unspecified: Secondary | ICD-10-CM | POA: Diagnosis not present

## 2021-03-08 DIAGNOSIS — D638 Anemia in other chronic diseases classified elsewhere: Secondary | ICD-10-CM

## 2021-03-08 DIAGNOSIS — Z48812 Encounter for surgical aftercare following surgery on the circulatory system: Secondary | ICD-10-CM

## 2021-03-08 DIAGNOSIS — R7989 Other specified abnormal findings of blood chemistry: Secondary | ICD-10-CM | POA: Diagnosis present

## 2021-03-08 DIAGNOSIS — E1122 Type 2 diabetes mellitus with diabetic chronic kidney disease: Secondary | ICD-10-CM | POA: Diagnosis not present

## 2021-03-08 DIAGNOSIS — H532 Diplopia: Secondary | ICD-10-CM

## 2021-03-08 DIAGNOSIS — Z89612 Acquired absence of left leg above knee: Secondary | ICD-10-CM

## 2021-03-08 DIAGNOSIS — R2990 Unspecified symptoms and signs involving the nervous system: Secondary | ICD-10-CM

## 2021-03-08 DIAGNOSIS — R778 Other specified abnormalities of plasma proteins: Secondary | ICD-10-CM | POA: Diagnosis not present

## 2021-03-08 LAB — CBC
HCT: 30.7 % — ABNORMAL LOW (ref 36.0–46.0)
Hemoglobin: 9.9 g/dL — ABNORMAL LOW (ref 12.0–15.0)
MCH: 30 pg (ref 26.0–34.0)
MCHC: 32.2 g/dL (ref 30.0–36.0)
MCV: 93 fL (ref 80.0–100.0)
Platelets: 270 10*3/uL (ref 150–400)
RBC: 3.3 MIL/uL — ABNORMAL LOW (ref 3.87–5.11)
RDW: 15.6 % — ABNORMAL HIGH (ref 11.5–15.5)
WBC: 9.6 10*3/uL (ref 4.0–10.5)
nRBC: 0 % (ref 0.0–0.2)

## 2021-03-08 LAB — BASIC METABOLIC PANEL
Anion gap: 8 (ref 5–15)
BUN: 28 mg/dL — ABNORMAL HIGH (ref 8–23)
CO2: 24 mmol/L (ref 22–32)
Calcium: 8.6 mg/dL — ABNORMAL LOW (ref 8.9–10.3)
Chloride: 103 mmol/L (ref 98–111)
Creatinine, Ser: 1.5 mg/dL — ABNORMAL HIGH (ref 0.44–1.00)
GFR, Estimated: 35 mL/min — ABNORMAL LOW (ref >60)
Glucose, Bld: 229 mg/dL — ABNORMAL HIGH (ref 70–99)
Potassium: 4.5 mmol/L (ref 3.5–5.1)
Sodium: 135 mmol/L (ref 135–145)

## 2021-03-08 LAB — GLUCOSE, CAPILLARY
Glucose-Capillary: 191 mg/dL — ABNORMAL HIGH (ref 70–99)
Glucose-Capillary: 276 mg/dL — ABNORMAL HIGH (ref 70–99)
Glucose-Capillary: 308 mg/dL — ABNORMAL HIGH (ref 70–99)
Glucose-Capillary: 235 mg/dL — ABNORMAL HIGH (ref 70–99)

## 2021-03-08 LAB — SURGICAL PATHOLOGY

## 2021-03-08 MED ORDER — INSULIN GLARGINE-YFGN 100 UNIT/ML ~~LOC~~ SOLN
15.0000 [IU] | Freq: Every day | SUBCUTANEOUS | Status: DC
  Administered 2021-03-08: 15 [IU] via SUBCUTANEOUS
  Filled 2021-03-08 (×2): qty 0.15

## 2021-03-08 NOTE — Assessment & Plan Note (Signed)
Remote. -Outpatient follow-up

## 2021-03-08 NOTE — Progress Notes (Signed)
PROGRESS NOTE  [name] PPI:[id number] DOB: 1940-07-30   PCP: [name], MD  Patient is from: Home  DOA: 02/28/2021 LOS: 7  Chief complaints:  Chief Complaint  Patient presents with   Weakness     Brief Narrative / Interim history: 81 y.o. female with medical history significant of DM2, HTN HLD, CAD sp CABG recent Covid.  Admitted for elevated troponin and leg weakness.  Found to have worsening ABIs and non-healing foot wounds as well as elevated troponin.  Elevated troponin treated medically.  S/p repeat angiogram and amputation by vascular.  CIR placement pending       Subjective:   Objective: Vitals:   03/08/21 0428 03/08/21 0434 03/08/21 0940 03/08/21 1130  BP: (!) 173/85 (!) 152/74 (!) 163/79 (!) 178/94  Pulse: (!) 102 93 (!) 105 97  Resp: 12 16  12   Temp: 97.9 F (36.6 C) 97.9 F (36.6 C) 98 F (36.7 C) 97.9 F (36.6 C)  TempSrc: Oral Oral Oral Oral  SpO2: 96% 99% 99% 96%  Weight:      Height:        Examination:  GENERAL: No apparent distress.  Nontoxic. HEENT: MMM.  Vision and hearing grossly intact.  NECK: Supple.  No apparent JVD.  RESP:  No IWOB.  Fair aeration bilaterally. CVS:  RRR. Heart sounds normal.  ABD/GI/GU: BS+. Abd soft, NTND.  MSK/EXT:  Moves RLE.  Status post left AKA. SKIN: no apparent skin lesion or wound NEURO: Awake, alert and oriented appropriately.  No apparent focal neuro deficit. PSYCH: Calm. Normal affect.   Procedures:  2/10-abdominal aortogram with lower extremity angiogram 2/13-LLE AKA  Microbiology summarized: 2/7-influenza A positive   Assessment and Plan: * PAD with bilateral claudication- (present on admission) S/p angiogram and left AKA on 2/13. -Continue aspirin.  On Repatha due to statin intolerance -Therapy recommended CIR. insurance authorization pending.  Elevated troponin- (present on admission) Patient without chest pain.  No EKG changes to suggest acute ischemia.  Likely demand ischemia  and delayed clearance from CKD4.  Received heparin drip for 48 hours. -Cardiology recommended medical management and signed off. -On aspirin, Repatha and beta-blocker  Stroke-like symptoms- (present on admission) Patient had diplopia, LUE weakness and "clumsiness".  Patient had prior posterior decompression at L3-4 through L5-S1.  MRI brain without acute finding.  MRI thoracic and lumbar spine with some moderate to severe lumbar canal and foraminal stenosis.  Patient had patient's diplopia resolved.  No bowel or bladder habit change. -Outpatient follow-up with neurosurgery/orthopedic surgery  Influenza A- (present on admission) Did not seem to be symptomatic from this.  Unlikely active infection.  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[place])- (present on admission) Recent Labs    02/28/21 1128 03/01/21 0128 03/01/21 0423 03/02/21 0042 03/03/21 0333 03/03/21 1739 03/04/21 0134 03/05/21 0150 03/06/21 0140 03/07/21 0137 03/08/21 0214  BUN 37* 32* 30* 28* 26*  --  23 28* 24* 25* 28*  CREATININE 1.95* 1.76* 1.70* 1.58* 1.62* 1.56* 1.72* 1.63* 1.64* 1.69* 1.50*  -Stable   Type 2 diabetes mellitus with diabetic neuropathy, without long-term current use of insulin ([place])- (present on admission) CBG markedly elevated. Recent Labs  Lab 03/07/21 1131 03/07/21 1630 03/07/21 2134 03/08/21 0626 03/08/21 1128  GLUCAP 245* 325* 233* 191* 276*  -Increase basal insulin to 15 units nightly -Continue NovoLog 4 units 3 times daily with meals -Continue SSI-moderate -Further adjustment as appropriate.   Bilateral lower extremity edema- (present on admission) LE [place]  negative for DVT.  Due to Lyrica? -Lyrica discontinued.  Diabetic peripheral neuropathy ([place])- (present on admission) - Lyrica discontinued out of concern for LLE edema.  Hyperlipidemia- (present on admission) On Repatha due to statin intolerance  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 female breast ([place])-  (present on admission) Remote. -Outpatient follow-up  Coronary atherosclerosis- (present on admission) C elevated troponin  Essential hypertension- (present on admission) Normotensive. -Continue metoprolol  Hypothyroidism- (present on admission) Continue home Synthroid     Increased nutrient needs Body mass index is 26.05 kg/m. Nutrition Problem: Increased nutrient needs Etiology: wound healing (L AKA) Signs/Symptoms: estimated needs Interventions: Ensure Enlive (each supplement provides 350kcal and 20 grams of protein), MVI   DVT prophylaxis:  heparin injection 5,000 Units Start: 03/06/21 2200 SCD's Start: 03/06/21 1229 SCD's Start: 03/03/21 1714 Place TED hose Start: 02/28/21 2159  Code Status: DNR/DNI Family Communication: Updated patient's husband at bedside. Level of care: Telemetry Surgical Status is: Inpatient Remains inpatient appropriate because: Lack of safe disposition/CIR     Final disposition: CIR once bed available      Consultants:  Vascular surgery Cardiology   Sch Meds:  Scheduled Meds:  aspirin EC  81 mg Oral Daily   bacitracin   Topical BID   docusate sodium  100 mg Oral BID   feeding supplement  237 mL Oral BID BM   heparin  5,000 Units Subcutaneous Q8H   insulin aspart  0-15 Units Subcutaneous TID WC   insulin aspart  0-5 Units Subcutaneous QHS   insulin aspart  4 Units Subcutaneous TID WC   insulin glargine-yfgn  15 Units Subcutaneous QHS   levothyroxine  100 mcg Oral Q0600   metoprolol tartrate  25 mg Oral BID   multivitamin with minerals  1 tablet Oral Daily   mupirocin ointment  1 application Nasal BID   pantoprazole  40 mg Oral Daily   sodium chloride flush  3 mL Intravenous Q12H   sodium chloride flush  3 mL Intravenous Q12H   traMADol  50 mg Oral Q12H   Continuous Infusions:  sodium chloride     sodium chloride     magnesium sulfate bolus IVPB     PRN Meds:.sodium chloride, sodium chloride, acetaminophen, alum &  mag hydroxide-simeth, calcium carbonate, guaiFENesin-dextromethorphan, hydrALAZINE, HYDROcodone-acetaminophen, labetalol, magnesium sulfate bolus IVPB, metoprolol tartrate, morphine injection, ondansetron (ZOFRAN) IV, phenol, potassium chloride, sodium chloride flush, sodium chloride flush  Antimicrobials: Anti-infectives (From admission, onward)    Start     Dose/Rate Route Frequency Ordered Stop   03/06/21 0914  ceFAZolin (ANCEF) 1-4 GM/50ML-% IVPB       Note to Pharmacy: Tamsen Snider M: cabinet override      03/06/21 0914 03/06/21 0930        I have personally reviewed the following labs and images: CBC: Recent Labs  Lab 03/04/21 0134 03/05/21 0150 03/06/21 0140 03/07/21 0137 03/08/21 0214  WBC 11.8* 8.0 8.7 12.5* 9.6  HGB 13.1 10.6* 10.7* 10.2* 9.9*  HCT 40.5 32.8* 33.4* 32.0* 30.7*  MCV 92.9 91.9 91.0 92.0 93.0  PLT 203 184 227 230 270   BMP &GFR Recent Labs  Lab 03/04/21 0134 03/05/21 0150 03/06/21 0140 03/07/21 0137 03/08/21 0214  NA 136 134* 135 134* 135  K 4.4 4.2 4.3 5.1 4.5  CL 102 102 101 102 103  CO2 21* 24 23 22 24   GLUCOSE 217* 303* 252* 250* 229*  BUN 23 28* 24* 25* 28*  CREATININE 1.72* 1.63* 1.64* 1.69* 1.50*  CALCIUM 9.2 8.7* 9.1 8.7* 8.6*   Estimated Creatinine Clearance: 28.4 mL/min (A) (by C-G formula based on SCr of 1.5 mg/dL (H)). Liver & Pancreas: No results for input(s): AST, ALT, ALKPHOS, BILITOT, PROT, ALBUMIN in the last 168 hours. No results for input(s): LIPASE, AMYLASE in the last 168 hours. No results for input(s): AMMONIA in the last 168 hours. Diabetic: No results for input(s): HGBA1C in the last 72 hours. Recent Labs  Lab 03/07/21 1131 03/07/21 1630 03/07/21 2134 03/08/21 0626 03/08/21 1128  GLUCAP 245* 325* 233* 191* 276*   Cardiac Enzymes: No results for input(s): CKTOTAL, CKMB, CKMBINDEX, TROPONINI in the last 168 hours. Recent Labs    09/01/20 1023 10/26/20 1137 02/21/21 1209  PROBNP 2,950* 2,308* 200.0*    Coagulation Profile: No results for input(s): INR, PROTIME in the last 168 hours. Thyroid Function Tests: No results for input(s): TSH, T4TOTAL, FREET4, T3FREE, THYROIDAB in the last 72 hours. Lipid Profile: No results for input(s): CHOL, HDL, LDLCALC, TRIG, CHOLHDL, LDLDIRECT in the last 72 hours. Anemia Panel: No results for input(s): VITAMINB12, FOLATE, FERRITIN, TIBC, IRON, RETICCTPCT in the last 72 hours. Urine analysis:    Component Value Date/Time   COLORURINE STRAW (A) 02/28/2021 2133   APPEARANCEUR CLEAR 02/28/2021 2133   LABSPEC 1.010 02/28/2021 2133   PHURINE 5.0 02/28/2021 2133   GLUCOSEU >=500 (A) 02/28/2021 2133   GLUCOSEU NEGATIVE 11/02/2020 0940   HGBUR NEGATIVE 02/28/2021 2133   BILIRUBINUR NEGATIVE 02/28/2021 2133   BILIRUBINUR negative 10/19/2020 1549   KETONESUR NEGATIVE 02/28/2021 2133   PROTEINUR NEGATIVE 02/28/2021 2133   UROBILINOGEN 0.2 11/02/2020 0940   NITRITE NEGATIVE 02/28/2021 2133   LEUKOCYTESUR NEGATIVE 02/28/2021 2133   Sepsis Labs: Invalid input(s): PROCALCITONIN, [place]  Microbiology: Recent Results (from the past 240 hour(s))  Resp Panel by RT-PCR (Flu A&B, Covid) Nasopharyngeal Swab     Status: Abnormal   Collection Time: 02/28/21  6:41 PM   Specimen: Nasopharyngeal Swab; Nasopharyngeal(NP) swabs in vial transport medium  Result Value Ref Range Status   SARS Coronavirus 2 by RT PCR NEGATIVE NEGATIVE Final    Comment: (NOTE) SARS-CoV-2 target nucleic acids are NOT DETECTED.  The SARS-CoV-2 RNA is generally detectable in upper respiratory specimens during the acute phase of infection. The lowest concentration of SARS-CoV-2 viral copies this assay can detect is 138 copies/mL. A negative result does not preclude SARS-Cov-2 infection and should not be used as the sole basis for treatment or other patient management decisions. A negative result may occur with  improper specimen collection/handling, submission of specimen other than  nasopharyngeal swab, presence of viral mutation(s) within the areas targeted by this assay, and inadequate number of viral copies(<138 copies/mL). A negative result must be combined with clinical observations, patient history, and epidemiological information. The expected result is Negative.  Fact Sheet for Patients:  EntrepreneurPulse.com.au  Fact Sheet for Healthcare Providers:  IncredibleEmployment.be  This test is no t yet approved or cleared by the [place] FDA and  has been authorized for detection and/or diagnosis of SARS-CoV-2 by FDA under an Emergency Use Authorization (EUA). This EUA will remain  in effect (meaning this test can be used) for the duration of the COVID-19 declaration under Section 564(b)(1) of the Act, 21 U.S.C.section 360bbb-3(b)(1), unless the authorization is terminated  or revoked sooner.       Influenza A by PCR POSITIVE (A) NEGATIVE Final   Influenza B by PCR NEGATIVE NEGATIVE Final    Comment: (NOTE) The Xpert Xpress SARS-CoV-2/FLU/RSV plus  assay is intended as an aid in the diagnosis of influenza from Nasopharyngeal swab specimens and should not be used as a sole basis for treatment. Nasal washings and aspirates are unacceptable for Xpert Xpress SARS-CoV-2/FLU/RSV testing.  Fact Sheet for Patients: EntrepreneurPulse.com.au  Fact Sheet for Healthcare Providers: IncredibleEmployment.be  This test is not yet approved or cleared by the [place] FDA and has been authorized for detection and/or diagnosis of SARS-CoV-2 by FDA under an Emergency Use Authorization (EUA). This EUA will remain in effect (meaning this test can be used) for the duration of the COVID-19 declaration under Section 564(b)(1) of the Act, 21 U.S.C. section 360bbb-3(b)(1), unless the authorization is terminated or revoked.  Performed at [facility], [place] [address], [place],  [place] [address]   Surgical pcr screen     Status: Abnormal   Collection Time: 03/05/21 11:26 AM   Specimen: Nasal Mucosa; Nasal Swab  Result Value Ref Range Status   MRSA, PCR NEGATIVE NEGATIVE Final   Staphylococcus aureus POSITIVE (A) NEGATIVE Final    Comment: (NOTE) The Xpert SA Assay (FDA approved for NASAL specimens in patients 63 years of age and older), is one component of a comprehensive surveillance program. It is not intended to diagnose infection nor to guide or monitor treatment. Performed at [facility], [place] [address], [place], [place] [address]     Radiology Studies: No results found.    Tya Haughey T. [place]  If 7PM-7AM, please contact night-coverage [email or web address] 03/08/2021, 2:39 PM

## 2021-03-08 NOTE — Progress Notes (Signed)
Inpatient Rehab Admissions Coordinator:   Awaiting insurance determination regarding CIR prior auth request.  Will continue to follow.  Shann Medal, PT, DPT Admissions Coordinator [phone] 03/08/21  12:54 PM

## 2021-03-08 NOTE — Assessment & Plan Note (Addendum)
-  Resume Lyrica.

## 2021-03-08 NOTE — Progress Notes (Signed)
PT Cancellation Note  Patient Details Name: [name] MRN: [id number] DOB: 06/01/1940   Cancelled Treatment:    Reason Eval/Treat Not Completed: Fatigue/lethargy limiting ability to participate;Medical issues which prohibited therapy. PT attempted x 2. Pt fatigued on first attempt due to just finishing up bathing and bed linen change with NT. On second attempt, pt in bed with washcloth on her head and emesis bag in hand. She reports nausea following pain meds. Pt politely declining OOB but relaying disappointment in herself for not getting up. Reassured pt that rest may be what she needs right now and therapy will return tomorrow. Pt is highly motivated to participate and progress with therapy.   Lorriane Shire 03/08/2021, 11:54 AM  Lorrin Goodell, PT  Office # [phone] Pager [phone]

## 2021-03-08 NOTE — Progress Notes (Signed)
°  Progress Note    03/08/2021 7:57 AM 2 Days Post-Op  Subjective:  says she sat in the chair for about 3.5 hrs.  PT told her to move her stump about every 3 min.  Says she's a little sore.  Biggest issue is she is ready to leave hospital   Afebrile  Vitals:   03/08/21 0428 03/08/21 0434  BP: (!) 173/85 (!) 152/74  Pulse: (!) 102 93  Resp: 12 16  Temp: 97.9 F (36.6 C) 97.9 F (36.6 C)  SpO2: 96% 99%    Physical Exam: Incisions:  bandage in place.     CBC    Component Value Date/Time   WBC 9.6 03/08/2021 0214   RBC 3.30 (L) 03/08/2021 0214   HGB 9.9 (L) 03/08/2021 0214   HGB 12.7 08/24/2020 1005   HGB 12.0 02/21/2016 1035   HCT 30.7 (L) 03/08/2021 0214   HCT 38.8 08/24/2020 1005   HCT 36.4 02/21/2016 1035   PLT 270 03/08/2021 0214   PLT 226 08/24/2020 1005   MCV 93.0 03/08/2021 0214   MCV 88 08/24/2020 1005   MCV 87.4 02/21/2016 1035   MCH 30.0 03/08/2021 0214   MCHC 32.2 03/08/2021 0214   RDW 15.6 (H) 03/08/2021 0214   RDW 13.3 08/24/2020 1005   RDW 14.2 02/21/2016 1035   LYMPHSABS 3.1 03/01/2021 0423   LYMPHSABS 2.6 08/24/2020 1005   LYMPHSABS 2.6 02/21/2016 1035   MONOABS 1.2 (H) 03/01/2021 0423   MONOABS 0.7 02/21/2016 1035   EOSABS 0.2 03/01/2021 0423   EOSABS 0.5 (H) 08/24/2020 1005   BASOSABS 0.0 03/01/2021 0423   BASOSABS 0.1 08/24/2020 1005   BASOSABS 0.1 02/21/2016 1035    BMET    Component Value Date/Time   NA 135 03/08/2021 0214   NA 138 10/26/2020 1137   NA 138 02/21/2016 1035   K 4.5 03/08/2021 0214   K 4.5 02/21/2016 1035   CL 103 03/08/2021 0214   CL 106 04/10/2012 0921   CO2 24 03/08/2021 0214   CO2 24 02/21/2016 1035   GLUCOSE 229 (H) 03/08/2021 0214   GLUCOSE 194 (H) 02/21/2016 1035   GLUCOSE 216 (H) 04/10/2012 0921   BUN 28 (H) 03/08/2021 0214   BUN 32 (H) 10/26/2020 1137   BUN 18.7 02/21/2016 1035   CREATININE 1.50 (H) 03/08/2021 0214   CREATININE 1.4 (H) 02/21/2016 1035   CALCIUM 8.6 (L) 03/08/2021 0214   CALCIUM 9.0  02/21/2016 1035   GFRNONAA 35 (L) 03/08/2021 0214   GFRAA 32 (L) 03/11/2020 0913    INR    Component Value Date/Time   INR 1.4 (H) 05/04/2020 1544     Intake/Output Summary (Last 24 hours) at 03/08/2021 0757 Last data filed at 03/08/2021 5625 Gross per 24 hour  Intake 720 ml  Output 1200 ml  Net -480 ml     Assessment/Plan:  81 y.o. 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  -creatinine improving and hgb stable   Leontine Locket, PA-C [facility] [phone] 03/08/2021 7:57 AM

## 2021-03-08 NOTE — Assessment & Plan Note (Addendum)
Recent Labs    03/01/21 0423 03/02/21 0042 03/03/21 0333 03/03/21 1739 03/04/21 0134 03/05/21 0150 03/06/21 0140 03/07/21 0137 03/08/21 0214 03/09/21 0255 03/12/21 0132  BUN 30* 28* 26*  --  23 28* 24* 25* 28* 26* 31*  CREATININE 1.70* 1.58* 1.62* 1.56* 1.72* 1.63* 1.64* 1.69* 1.50* 1.59* 1.26*  -Recheck renal function in about a week. -Holding losartan for another 3 to 4 days

## 2021-03-08 NOTE — Progress Notes (Signed)
Nutrition Follow-up  DOCUMENTATION CODES:   Not applicable  INTERVENTION:   Continue Ensure Enlive po BID, each supplement provides 350 kcal and 20 grams of protein.  Continue MVI with minerals daily.  Consider adjusting bowel regimen, no BM since Sunday per patient.   NUTRITION DIAGNOSIS:   Increased nutrient needs related to wound healing (L AKA) as evidenced by estimated needs.  Ongoing   GOAL:   Patient will meet greater than or equal to 90% of their needs  Progressing   MONITOR:   PO intake, Supplement acceptance, Labs, Skin  REASON FOR ASSESSMENT:   Malnutrition Screening Tool    ASSESSMENT:   81 yo female with a PMH of T2DM, HTN, HLD, CAD s/p CABG, recent COVID-19 infection, and PAD with claudication who presents with elevated troponin and leg weakness. Admitted with Influenza A.  2/13 - S/P L AKA    Patient reports appetite comes and goes. PTA she had lost a few pounds, but not much. She felt nauseous this morning after taking medications on an empty stomach, so she only ate half of breakfast today. She c/o no BM since Sunday, which could be affecting appetite and intake. She is receiving Colace daily. May need to adjust bowel regimen.   Currently on a heart healthy carb modified diet. Meal intakes: 50-100% (average 69% for the last 8 documented meals)  Labs reviewed.  CBG: 191 this morning  Medications reviewed and include Colace, Novolog, Semglee, MVI with minerals, Protonix.  NUTRITION - FOCUSED PHYSICAL EXAM:  Flowsheet Row Most Recent Value  Orbital Region No depletion  Upper Arm Region No depletion  Thoracic and Lumbar Region No depletion  Buccal Region Mild depletion  Temple Region Mild depletion  Clavicle Bone Region Mild depletion  Clavicle and Acromion Bone Region No depletion  Scapular Bone Region No depletion  Dorsal Hand No depletion  Patellar Region Mild depletion  Anterior Thigh Region Mild depletion  Posterior Calf Region  Moderate depletion  Edema (RD Assessment) None  Hair Reviewed  Eyes Reviewed  Mouth Reviewed  Skin Reviewed  Nails Reviewed       Diet Order:   Diet Order             Diet heart healthy/carb modified Room service appropriate? Yes; Fluid consistency: Thin  Diet effective now                   EDUCATION NEEDS:   No education needs have been identified at this time  Skin:  Skin Assessment: Reviewed RN Assessment  Last BM:  2/12, type 4  Height:   Ht Readings from Last 1 Encounters:  03/06/21 5\' 3"  (1.6 m)    Weight:   Wt Readings from Last 1 Encounters:  02/28/21 66.7 kg    BMI:  Body mass index is 26.05 kg/m.  Estimated Nutritional Needs:   Kcal:  1900-2100  Protein:  80-95 grams  Fluid:  >1.9 L    Lucas Mallow RD, LDN, CNSC Please refer to Amion for contact information.

## 2021-03-08 NOTE — Telephone Encounter (Signed)
FYI:Her daughter just called in to cancel [name]'s appointment in April because she has just had her leg amputated. She said her mom was handling it than everyone else.

## 2021-03-08 NOTE — Assessment & Plan Note (Addendum)
LE [place] negative for DVT.  Improved.

## 2021-03-08 NOTE — Assessment & Plan Note (Addendum)
Patient had diplopia, LUE weakness and "clumsiness".  Had prior posterior decompression at L3-4 through L5-S1.  MRI brain without acute finding.  MRI thoracic and lumbar spine with some moderate to severe lumbar canal and foraminal stenosis.  Patient had patient's diplopia resolved.  No bowel or bladder habit change. -Outpatient follow-up with neurosurgery/orthopedic surgery

## 2021-03-08 NOTE — Assessment & Plan Note (Addendum)
No respiratory symptoms.  No indication for treatment or isolation precaution

## 2021-03-08 NOTE — Plan of Care (Signed)

## 2021-03-08 NOTE — Progress Notes (Addendum)
Inpatient Diabetes Program Recommendations  AACE/ADA: New Consensus Statement on Inpatient Glycemic Control   Target Ranges:  Prepandial:   less than 140 mg/dL      Peak postprandial:   less than 180 mg/dL (1-2 hours)      Critically ill patients:  140 - 180 mg/dL    Latest Reference Range & Units 03/07/21 06:05 03/07/21 11:31 03/07/21 16:30 03/07/21 21:34 03/08/21 06:26  Glucose-Capillary 70 - 99 mg/dL 223 (H) 245 (H) 325 (H) 233 (H) 191 (H)   Review of Glycemic Control  Diabetes history: DM2 Outpatient Diabetes medications: Lantus 10 units daily, Jardiance 10 mg daily, Glucotrol 10 mg daily, Ozempic 1.25 mg weekly Current orders for Inpatient glycemic control: Semglee 10 units QHS, Novolog 0-15 units TID with meals, Novolog 0-5 units QHS, Novolog 4 units TID with meals  Inpatient Diabetes Program Recommendations:    Insulin: Please consider increasing Semglee to 12 units QHS and increasing meal coverage to Novolog 7 units TID with meals.  Thanks, Barnie Alderman, RN, MSN, CDE Diabetes Coordinator Inpatient Diabetes Program [phone] (Team Pager from 8am to 5pm)

## 2021-03-08 NOTE — Assessment & Plan Note (Addendum)
-   Continue home Synthroid °

## 2021-03-08 NOTE — Hospital Course (Addendum)
81 y.o. female with medical history significant of DM2, HTN HLD, CAD sp CABG recent Covid.  Admitted for elevated troponin and leg weakness.  Found to have worsening ABIs and non-healing foot wounds, elevated troponin and influenza A.  Elevated troponin treated medically per recommendation by cardiology.  She did not require treatment for influenza A.  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  Hospital course complicated by strokelike symptoms such as diplopia, LLE weakness and clumsiness but the stroke work-up unrevealing. MRI thoracic and lumbar spine with some moderate to severe lumbar canal and foraminal stenosis.  Patient had patient's diplopia resolved.  No bowel or bladder habit change.  She is discharged to CIR as recommended by therapy.  See individual problem list below for more hospital course.

## 2021-03-08 NOTE — Assessment & Plan Note (Signed)
On Repatha due to statin intolerance

## 2021-03-08 NOTE — Progress Notes (Signed)
°  Progress Note    03/08/2021 8:14 AM 2 Days Post-Op  Subjective:  says she sat in the chair for about 3.5 hrs.  PT told her to move her stump about every 3 min.  Says she's a little sore.  Biggest issue is she is ready to leave hospital   Afebrile  Vitals:   03/08/21 0428 03/08/21 0434  BP: (!) 173/85 (!) 152/74  Pulse: (!) 102 93  Resp: 12 16  Temp: 97.9 F (36.6 C) 97.9 F (36.6 C)  SpO2: 96% 99%    Physical Exam: Incisions:  bandage in place.     CBC    Component Value Date/Time   WBC 9.6 03/08/2021 0214   RBC 3.30 (L) 03/08/2021 0214   HGB 9.9 (L) 03/08/2021 0214   HGB 12.7 08/24/2020 1005   HGB 12.0 02/21/2016 1035   HCT 30.7 (L) 03/08/2021 0214   HCT 38.8 08/24/2020 1005   HCT 36.4 02/21/2016 1035   PLT 270 03/08/2021 0214   PLT 226 08/24/2020 1005   MCV 93.0 03/08/2021 0214   MCV 88 08/24/2020 1005   MCV 87.4 02/21/2016 1035   MCH 30.0 03/08/2021 0214   MCHC 32.2 03/08/2021 0214   RDW 15.6 (H) 03/08/2021 0214   RDW 13.3 08/24/2020 1005   RDW 14.2 02/21/2016 1035   LYMPHSABS 3.1 03/01/2021 0423   LYMPHSABS 2.6 08/24/2020 1005   LYMPHSABS 2.6 02/21/2016 1035   MONOABS 1.2 (H) 03/01/2021 0423   MONOABS 0.7 02/21/2016 1035   EOSABS 0.2 03/01/2021 0423   EOSABS 0.5 (H) 08/24/2020 1005   BASOSABS 0.0 03/01/2021 0423   BASOSABS 0.1 08/24/2020 1005   BASOSABS 0.1 02/21/2016 1035    BMET    Component Value Date/Time   NA 135 03/08/2021 0214   NA 138 10/26/2020 1137   NA 138 02/21/2016 1035   K 4.5 03/08/2021 0214   K 4.5 02/21/2016 1035   CL 103 03/08/2021 0214   CL 106 04/10/2012 0921   CO2 24 03/08/2021 0214   CO2 24 02/21/2016 1035   GLUCOSE 229 (H) 03/08/2021 0214   GLUCOSE 194 (H) 02/21/2016 1035   GLUCOSE 216 (H) 04/10/2012 0921   BUN 28 (H) 03/08/2021 0214   BUN 32 (H) 10/26/2020 1137   BUN 18.7 02/21/2016 1035   CREATININE 1.50 (H) 03/08/2021 0214   CREATININE 1.4 (H) 02/21/2016 1035   CALCIUM 8.6 (L) 03/08/2021 0214   CALCIUM 9.0  02/21/2016 1035   GFRNONAA 35 (L) 03/08/2021 0214   GFRAA 32 (L) 03/11/2020 0913    INR    Component Value Date/Time   INR 1.4 (H) 05/04/2020 1544     Intake/Output Summary (Last 24 hours) at 03/08/2021 4825 Last data filed at 03/08/2021 0037 Gross per 24 hour  Intake 720 ml  Output 1050 ml  Net -330 ml      Assessment/Plan:  81 y.o. 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  -creatinine improving and hgb stable   Leontine Locket, PA-C [facility] [phone] 03/08/2021 8:14 AM  VASCULAR STAFF ADDENDUM: I have independently interviewed and examined the patient. I agree with the above.  Pt progressing appropriately  [name], MD [facility] Phone Number: [phone] 03/08/2021 8:14 AM

## 2021-03-08 NOTE — Progress Notes (Signed)
Inpatient Rehab Admissions Coordinator:   Received request for peer to peer from [place].  Dr. Cyndia Skeeters aware and will attempt.  Will follow for determination.   Shann Medal, PT, DPT Admissions Coordinator [phone] 03/08/21  4:27 PM

## 2021-03-08 NOTE — Assessment & Plan Note (Addendum)
Hemoglobin A1c 11.6% on 2/80/2023. Recent Labs  Lab 03/12/21 1159 03/12/21 1644 03/12/21 2018 03/13/21 0629 03/13/21 1105  GLUCAP 213* 260* 160* 180* 333*  -Resume home glipizide, Ozempic and Jardiance -Increase home Lantus from 10 to 20 units daily -Added SSI-moderate -Closely monitor CBG and adjust insulin as appropriate -On Repatha due to statin intolerance

## 2021-03-08 NOTE — Assessment & Plan Note (Addendum)
See elevated troponin

## 2021-03-08 NOTE — Assessment & Plan Note (Addendum)
No chest pain.  No EKG changes to suggest acute ischemia.  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  Received heparin drip for 48 hours. -Cardiology recommended medical management and signed off. -On aspirin, Repatha and beta-blocker

## 2021-03-08 NOTE — Assessment & Plan Note (Addendum)
Metoprolol as above.

## 2021-03-08 NOTE — Assessment & Plan Note (Addendum)
S/p angiogram and left AKA on 2/13.  She could have claudication from spinal stenosis as well. -Continue aspirin.  On Repatha due to statin intolerance -Continue therapy at CIR. -Tylenol, Lyrica and Norco as needed for pain -Bowel regimen.

## 2021-03-09 DIAGNOSIS — I739 Peripheral vascular disease, unspecified: Secondary | ICD-10-CM | POA: Diagnosis not present

## 2021-03-09 DIAGNOSIS — R778 Other specified abnormalities of plasma proteins: Secondary | ICD-10-CM | POA: Diagnosis not present

## 2021-03-09 DIAGNOSIS — D638 Anemia in other chronic diseases classified elsewhere: Secondary | ICD-10-CM | POA: Diagnosis not present

## 2021-03-09 DIAGNOSIS — E1122 Type 2 diabetes mellitus with diabetic chronic kidney disease: Secondary | ICD-10-CM | POA: Diagnosis not present

## 2021-03-09 LAB — CBC
HCT: 37.5 % (ref 36.0–46.0)
Hemoglobin: 11.6 g/dL — ABNORMAL LOW (ref 12.0–15.0)
MCH: 29.3 pg (ref 26.0–34.0)
MCHC: 30.9 g/dL (ref 30.0–36.0)
MCV: 94.7 fL (ref 80.0–100.0)
Platelets: 353 10*3/uL (ref 150–400)
RBC: 3.96 MIL/uL (ref 3.87–5.11)
RDW: 15.3 % (ref 11.5–15.5)
WBC: 14.3 10*3/uL — ABNORMAL HIGH (ref 4.0–10.5)
nRBC: 0.1 % (ref 0.0–0.2)

## 2021-03-09 LAB — GLUCOSE, CAPILLARY
Glucose-Capillary: 219 mg/dL — ABNORMAL HIGH (ref 70–99)
Glucose-Capillary: 391 mg/dL — ABNORMAL HIGH (ref 70–99)
Glucose-Capillary: 280 mg/dL — ABNORMAL HIGH (ref 70–99)
Glucose-Capillary: 358 mg/dL — ABNORMAL HIGH (ref 70–99)

## 2021-03-09 LAB — IRON AND TIBC
Iron: 49 ug/dL (ref 28–170)
Saturation Ratios: 15 % (ref 10.4–31.8)
TIBC: 333 ug/dL (ref 250–450)
UIBC: 284 ug/dL

## 2021-03-09 LAB — FERRITIN: Ferritin: 85 ng/mL (ref 11–307)

## 2021-03-09 LAB — RENAL FUNCTION PANEL
Albumin: 2.8 g/dL — ABNORMAL LOW (ref 3.5–5.0)
Anion gap: 14 (ref 5–15)
BUN: 26 mg/dL — ABNORMAL HIGH (ref 8–23)
CO2: 22 mmol/L (ref 22–32)
Calcium: 9 mg/dL (ref 8.9–10.3)
Chloride: 99 mmol/L (ref 98–111)
Creatinine, Ser: 1.59 mg/dL — ABNORMAL HIGH (ref 0.44–1.00)
GFR, Estimated: 33 mL/min — ABNORMAL LOW (ref >60)
Glucose, Bld: 224 mg/dL — ABNORMAL HIGH (ref 70–99)
Phosphorus: 2.8 mg/dL (ref 2.5–4.6)
Potassium: 4.8 mmol/L (ref 3.5–5.1)
Sodium: 135 mmol/L (ref 135–145)

## 2021-03-09 LAB — RETICULOCYTES
Immature Retic Fract: 35 % — ABNORMAL HIGH (ref 2.3–15.9)
RBC.: 3.75 MIL/uL — ABNORMAL LOW (ref 3.87–5.11)
Retic Count, Absolute: 108 10*3/uL (ref 19.0–186.0)
Retic Ct Pct: 2.9 % (ref 0.4–3.1)

## 2021-03-09 LAB — FOLATE: Folate: 23 ng/mL (ref >5.9)

## 2021-03-09 LAB — MAGNESIUM: Magnesium: 1.8 mg/dL (ref 1.7–2.4)

## 2021-03-09 LAB — VITAMIN B12: Vitamin B-12: 1146 pg/mL — ABNORMAL HIGH (ref 180–914)

## 2021-03-09 MED ORDER — INSULIN GLARGINE-YFGN 100 UNIT/ML ~~LOC~~ SOLN
15.0000 [IU] | Freq: Two times a day (BID) | SUBCUTANEOUS | Status: DC
  Administered 2021-03-09 – 2021-03-10 (×3): 15 [IU] via SUBCUTANEOUS
  Filled 2021-03-09 (×5): qty 0.15

## 2021-03-09 MED ORDER — SENNOSIDES-DOCUSATE SODIUM 8.6-50 MG PO TABS
1.0000 | ORAL_TABLET | Freq: Two times a day (BID) | ORAL | Status: DC | PRN
  Administered 2021-03-09: 1 via ORAL
  Filled 2021-03-09: qty 1

## 2021-03-09 MED ORDER — BISACODYL 10 MG RE SUPP
10.0000 mg | Freq: Every day | RECTAL | Status: DC | PRN
  Filled 2021-03-09: qty 1

## 2021-03-09 MED ORDER — POLYETHYLENE GLYCOL 3350 17 G PO PACK
17.0000 g | PACK | Freq: Two times a day (BID) | ORAL | Status: DC | PRN
  Administered 2021-03-09: 17 g via ORAL
  Filled 2021-03-09: qty 1

## 2021-03-09 MED ORDER — INSULIN ASPART 100 UNIT/ML IJ SOLN
6.0000 [IU] | Freq: Three times a day (TID) | INTRAMUSCULAR | Status: DC
  Administered 2021-03-09 – 2021-03-10 (×4): 6 [IU] via SUBCUTANEOUS

## 2021-03-09 NOTE — Progress Notes (Signed)
Inpatient Rehab Admissions Coordinator:   Notified by Bernadene Bell of denial of CIR following peer to peer with Dr. Cyndia Skeeters this morning.  Spoke to pt's daughter who would like to pursue expedited appeal so I will start that today.  We will continue to follow.   Shann Medal, PT, DPT Admissions Coordinator [phone] 03/09/21  9:23 AM

## 2021-03-09 NOTE — Progress Notes (Signed)
°  Progress Note    03/09/2021 7:10 AM 3 Days Post-Op  Subjective:  no complaints; says she was nauseated when PT came to work with her yesterday but she is ready today.  Afebrile   Vitals:   03/09/21 0355 03/09/21 0430  BP: (!) 182/84 (!) 152/65  Pulse: (!) 114 (!) 103  Resp: 11 19  Temp: 98.3 F (36.8 C) 98.2 F (36.8 C)  SpO2: 98% 97%    Physical Exam: General:  resting comfortably Lungs:  non labored Incisions:  dressing in tact   CBC    Component Value Date/Time   WBC 14.3 (H) 03/09/2021 0255   RBC 3.96 03/09/2021 0255   RBC 3.75 (L) 03/09/2021 0255   HGB 11.6 (L) 03/09/2021 0255   HGB 12.7 08/24/2020 1005   HGB 12.0 02/21/2016 1035   HCT 37.5 03/09/2021 0255   HCT 38.8 08/24/2020 1005   HCT 36.4 02/21/2016 1035   PLT 353 03/09/2021 0255   PLT 226 08/24/2020 1005   MCV 94.7 03/09/2021 0255   MCV 88 08/24/2020 1005   MCV 87.4 02/21/2016 1035   MCH 29.3 03/09/2021 0255   MCHC 30.9 03/09/2021 0255   RDW 15.3 03/09/2021 0255   RDW 13.3 08/24/2020 1005   RDW 14.2 02/21/2016 1035   LYMPHSABS 3.1 03/01/2021 0423   LYMPHSABS 2.6 08/24/2020 1005   LYMPHSABS 2.6 02/21/2016 1035   MONOABS 1.2 (H) 03/01/2021 0423   MONOABS 0.7 02/21/2016 1035   EOSABS 0.2 03/01/2021 0423   EOSABS 0.5 (H) 08/24/2020 1005   BASOSABS 0.0 03/01/2021 0423   BASOSABS 0.1 08/24/2020 1005   BASOSABS 0.1 02/21/2016 1035    BMET    Component Value Date/Time   NA 135 03/09/2021 0255   NA 138 10/26/2020 1137   NA 138 02/21/2016 1035   K 4.8 03/09/2021 0255   K 4.5 02/21/2016 1035   CL 99 03/09/2021 0255   CL 106 04/10/2012 0921   CO2 22 03/09/2021 0255   CO2 24 02/21/2016 1035   GLUCOSE 224 (H) 03/09/2021 0255   GLUCOSE 194 (H) 02/21/2016 1035   GLUCOSE 216 (H) 04/10/2012 0921   BUN 26 (H) 03/09/2021 0255   BUN 32 (H) 10/26/2020 1137   BUN 18.7 02/21/2016 1035   CREATININE 1.59 (H) 03/09/2021 0255   CREATININE 1.4 (H) 02/21/2016 1035   CALCIUM 9.0 03/09/2021 0255    CALCIUM 9.0 02/21/2016 1035   GFRNONAA 33 (L) 03/09/2021 0255   GFRAA 32 (L) 03/11/2020 0913    INR    Component Value Date/Time   INR 1.4 (H) 05/04/2020 1544     Intake/Output Summary (Last 24 hours) at 03/09/2021 0710 Last data filed at 03/08/2021 2200 Gross per 24 hour  Intake 240 ml  Output 300 ml  Net -60 ml     Assessment/Plan:  81 y.o. female is s/p:  Left AKA  3 Days Post-Op   -will take down dressing tomorrow.   -CIR awaiting insurance approval and peer to peer review. -continue PT   Leontine Locket, PA-C [facility] [phone] 03/09/2021 7:10 AM

## 2021-03-09 NOTE — Progress Notes (Signed)
°  Progress Note    03/09/2021 7:52 AM 3 Days Post-Op  Subjective:  no complaints; says she was nauseated when PT came to work with her yesterday but she is ready today.  Afebrile   Vitals:   03/09/21 0355 03/09/21 0430  BP: (!) 182/84 (!) 152/65  Pulse: (!) 114 (!) 103  Resp: 11 19  Temp: 98.3 F (36.8 C) 98.2 F (36.8 C)  SpO2: 98% 97%    Physical Exam: General:  resting comfortably Lungs:  non labored Incisions:  dressing in tact   CBC    Component Value Date/Time   WBC 14.3 (H) 03/09/2021 0255   RBC 3.96 03/09/2021 0255   RBC 3.75 (L) 03/09/2021 0255   HGB 11.6 (L) 03/09/2021 0255   HGB 12.7 08/24/2020 1005   HGB 12.0 02/21/2016 1035   HCT 37.5 03/09/2021 0255   HCT 38.8 08/24/2020 1005   HCT 36.4 02/21/2016 1035   PLT 353 03/09/2021 0255   PLT 226 08/24/2020 1005   MCV 94.7 03/09/2021 0255   MCV 88 08/24/2020 1005   MCV 87.4 02/21/2016 1035   MCH 29.3 03/09/2021 0255   MCHC 30.9 03/09/2021 0255   RDW 15.3 03/09/2021 0255   RDW 13.3 08/24/2020 1005   RDW 14.2 02/21/2016 1035   LYMPHSABS 3.1 03/01/2021 0423   LYMPHSABS 2.6 08/24/2020 1005   LYMPHSABS 2.6 02/21/2016 1035   MONOABS 1.2 (H) 03/01/2021 0423   MONOABS 0.7 02/21/2016 1035   EOSABS 0.2 03/01/2021 0423   EOSABS 0.5 (H) 08/24/2020 1005   BASOSABS 0.0 03/01/2021 0423   BASOSABS 0.1 08/24/2020 1005   BASOSABS 0.1 02/21/2016 1035    BMET    Component Value Date/Time   NA 135 03/09/2021 0255   NA 138 10/26/2020 1137   NA 138 02/21/2016 1035   K 4.8 03/09/2021 0255   K 4.5 02/21/2016 1035   CL 99 03/09/2021 0255   CL 106 04/10/2012 0921   CO2 22 03/09/2021 0255   CO2 24 02/21/2016 1035   GLUCOSE 224 (H) 03/09/2021 0255   GLUCOSE 194 (H) 02/21/2016 1035   GLUCOSE 216 (H) 04/10/2012 0921   BUN 26 (H) 03/09/2021 0255   BUN 32 (H) 10/26/2020 1137   BUN 18.7 02/21/2016 1035   CREATININE 1.59 (H) 03/09/2021 0255   CREATININE 1.4 (H) 02/21/2016 1035   CALCIUM 9.0 03/09/2021 0255    CALCIUM 9.0 02/21/2016 1035   GFRNONAA 33 (L) 03/09/2021 0255   GFRAA 32 (L) 03/11/2020 0913    INR    Component Value Date/Time   INR 1.4 (H) 05/04/2020 1544     Intake/Output Summary (Last 24 hours) at 03/09/2021 6644 Last data filed at 03/08/2021 2200 Gross per 24 hour  Intake 240 ml  Output 300 ml  Net -60 ml      Assessment/Plan:  81 y.o. female is s/p:  Left AKA  3 Days Post-Op   -will take down dressing tomorrow.   -CIR awaiting insurance approval and peer to peer review. -continue PT   Leontine Locket, PA-C [facility] [phone] 03/09/2021 7:52 AM  VASCULAR STAFF ADDENDUM: I have independently interviewed and examined the patient. I agree with the above.  Dressing down tomorrow.  [name], MD [facility] Phone Number: [phone] 03/09/2021 7:52 AM

## 2021-03-09 NOTE — Progress Notes (Signed)
Physical Therapy Treatment Patient Details Name: [name] MRN: [id number] DOB: 1940/11/05 Today's Date: 03/09/2021   History of Present Illness Pt is a 81 y.o. female admitted for  elevated troponin and leg weakness. Found to have AKI and NSTEMI. 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 PMH includes CAD (s/p CABG, AVR), DM2, breast CA, CKD IV, HTN, OA, PAD, RA, stenosis, neuropathy, Covid.    PT Comments    Pt making good progress with all mobility and able to progress transfers to standing with walker with assistance. Pt is very motivated and I feel with inpatient rehab stay she could progress to modified independent level with basic mobility.    Recommendations for follow up therapy are one component of a multi-disciplinary discharge planning process, led by the attending physician.  Recommendations may be updated based on patient status, additional functional criteria and insurance authorization.  Follow Up Recommendations  Acute inpatient rehab (3hours/day)     Assistance Recommended at Discharge Intermittent Supervision/Assistance  Patient can return home with the following A little help with walking and/or transfers;Help with stairs or ramp for entrance   Equipment Recommendations  Wheelchair (measurements PT);Wheelchair cushion (measurements PT);BSC/3in1    Recommendations for Other Services       Precautions / Restrictions Precautions Precautions: Fall;Other (comment) Precaution Comments: left AKA Restrictions Weight Bearing Restrictions: Yes LLE Weight Bearing: Non weight bearing     Mobility  Bed Mobility Overal bed mobility: Needs Assistance Bed Mobility: Supine to Sit     Supine to sit: Min assist, HOB elevated     General bed mobility comments: Assist to elevate trunk into sitting    Transfers Overall transfer level: Needs assistance Equipment used: Rolling walker (2 wheels) Transfers: Sit to/from Stand, Bed to chair/wheelchair/BSC Sit to Stand: Min assist Stand  pivot transfers: Min assist         General transfer comment: Min assist to bring hips up and to stabilize balance for sit to stand with walker. Verbal cues for hand placement. Perform stand pivot from bed to chair with walker and pt pivoting/scooting on rt foot    Ambulation/Gait Ambulation/Gait assistance: Min assist Gait Distance (Feet): 8 Feet Assistive device: Rolling walker (2 wheels) Gait Pattern/deviations: Step-to pattern Gait velocity: decreased Gait velocity interpretation: <1.31 ft/sec, indicative of household ambulator   General Gait Details: Hop to pattern with walker with min assist for balance and support   Stairs             Wheelchair Mobility    Modified Rankin (Stroke Patients Only)       Balance Overall balance assessment: Needs assistance Sitting-balance support: No upper extremity supported, Feet supported Sitting balance-Leahy Scale: Fair     Standing balance support: Bilateral upper extremity supported Standing balance-Leahy Scale: Poor Standing balance comment: walker and min guard for static standing                            Cognition Arousal/Alertness: Awake/alert Behavior During Therapy: WFL for tasks assessed/performed Overall Cognitive Status: Within Functional Limits for tasks assessed                                          Exercises Amputee Exercises Quad Sets: Strengthening, Left, 5 reps, Supine Hip Extension: AROM, Left, 5 reps, Standing Hip ABduction/ADduction: AAROM, Left, 5 reps, Supine Hip Flexion/Marching: AAROM, Left, 5 reps,  Supine    General Comments General comments (skin integrity, edema, etc.): HR 110's with activity      Pertinent Vitals/Pain Pain Assessment Pain Assessment: Faces Faces Pain Scale: Hurts little more Pain Location: left residual limb with movement Pain Descriptors / Indicators: Sore, Tender Pain Intervention(s): Monitored during session, Repositioned     Home Living                          Prior Function            PT Goals (current goals can now be found in the care plan section) Acute Rehab PT Goals Patient Stated Goal: To go to rehab and return to independence PT Goal Formulation: With patient Time For Goal Achievement: 03/23/21 Potential to Achieve Goals: Good Progress towards PT goals: Goals downgraded-see care plan (original goals set prior to AKA)    Frequency    Min 3X/week      PT Plan Current plan remains appropriate    Co-evaluation              AM-PAC PT "6 Clicks" Mobility   Outcome Measure  Help needed turning from your back to your side while in a flat bed without using bedrails?: A Little Help needed moving from lying on your back to sitting on the side of a flat bed without using bedrails?: A Little Help needed moving to and from a bed to a chair (including a wheelchair)?: A Little Help needed standing up from a chair using your arms (e.g., wheelchair or bedside chair)?: A Little Help needed to walk in hospital room?: Total Help needed climbing 3-5 steps with a railing? : Total 6 Click Score: 14    End of Session Equipment Utilized During Treatment: Gait belt Activity Tolerance: Patient tolerated treatment well Patient left: in chair;with call bell/phone within reach;with chair alarm set Nurse Communication: Mobility status PT Visit Diagnosis: Other abnormalities of gait and mobility (R26.89);Pain;Unsteadiness on feet (R26.81) Pain - Right/Left: Left Pain - part of body: Leg     Time: 1657-9038 PT Time Calculation (min) (ACUTE ONLY): 32 min  Charges:  $Gait Training: 8-22 mins $Therapeutic Exercise: 8-22 mins                     Nambe Pager [phone] Office [place] 03/09/2021, 10:36 AM

## 2021-03-09 NOTE — Care Management Important Message (Signed)
Important Message  Patient Details  Name: [name] MRN: [id number] Date of Birth: Dec 04, 1940   Medicare Important Message Given:  Yes     Shelda Altes 03/09/2021, 9:46 AM

## 2021-03-09 NOTE — Assessment & Plan Note (Signed)
Continue PPI ?

## 2021-03-09 NOTE — Progress Notes (Signed)
Inpatient Diabetes Program Recommendations  AACE/ADA: New Consensus Statement on Inpatient Glycemic Control   Target Ranges:  Prepandial:   less than 140 mg/dL      Peak postprandial:   less than 180 mg/dL (1-2 hours)      Critically ill patients:  140 - 180 mg/dL    Latest Reference Range & Units 03/08/21 06:26 03/08/21 11:28 03/08/21 16:44 03/08/21 20:55 03/09/21 06:13  Glucose-Capillary 70 - 99 mg/dL 191 (H) 276 (H) 308 (H) 235 (H) 219 (H)   Review of Glycemic Control  Diabetes history: DM2 Outpatient Diabetes medications: Lantus 10 units daily, Jardiance 10 mg daily, Glucotrol 10 mg daily, Ozempic 1.25 mg weekly Current orders for Inpatient glycemic control: Semglee 15 units QHS, Novolog 0-15 units TID with meals, Novolog 0-5 units QHS, Novolog 4 units TID with meals   Inpatient Diabetes Program Recommendations:     Insulin: Please consider increasing Semglee to 20 units QHS.  Thanks, Barnie Alderman, RN, MSN, CDE Diabetes Coordinator Inpatient Diabetes Program [phone] (Team Pager from 8am to 5pm)

## 2021-03-09 NOTE — Assessment & Plan Note (Addendum)
Partly from hyperglycemia and CKD. -Manage hypoglycemia as above

## 2021-03-09 NOTE — Progress Notes (Signed)
PROGRESS NOTE  KHRYSTYNA SCHWALM CBS:[id number] DOB: 02-Oct-1940   PCP: [name], MD  Patient is from: Home  DOA: 02/28/2021 LOS: 8  Chief complaints:  Chief Complaint  Patient presents with   Weakness     Brief Narrative / Interim history: 81 y.o. female with medical history significant of DM2, HTN HLD, CAD sp CABG recent Covid.  Admitted for elevated troponin and leg weakness.  Found to have worsening ABIs and non-healing foot wounds as well as elevated troponin.  Elevated troponin treated medically.  S/p repeat angiogram and amputation by vascular.  Therapy recommended CIR but insurance denied even after peer to peer review.  Appealing.      Subjective: Seen and examined earlier this morning.  No major events overnight of this morning.  Surgical site pain fairly controlled.  She is having difficulty having bowel movements due to constipation.  She denies nausea, vomiting or abdominal pain.  Objective: Vitals:   03/09/21 0355 03/09/21 0430 03/09/21 0800 03/09/21 1108  BP: (!) 182/84 (!) 152/65 123/64 115/60  Pulse: (!) 114 (!) 103 (!) 114 99  Resp: 11 19 16 20   Temp: 98.3 F (36.8 C) 98.2 F (36.8 C) 98.4 F (36.9 C) 98.3 F (36.8 C)  TempSrc: Oral Oral Oral Oral  SpO2: 98% 97% 99% 100%  Weight:      Height:        Examination:  GENERAL: No apparent distress.  Nontoxic. HEENT: MMM.  Vision and hearing grossly intact.  NECK: Supple.  No apparent JVD.  RESP:  No IWOB.  Fair aeration bilaterally. CVS:  RRR. Heart sounds normal.  ABD/GI/GU: BS+. Abd soft, NTND.  MSK/EXT:  Moves extremities. S/p left AKA. SKIN: no apparent skin lesion or wound NEURO: Awake and alert. Oriented appropriately.  No apparent focal neuro deficit. PSYCH: Calm. Normal affect.   Procedures:  2/10-abdominal aortogram with lower extremity angiogram 2/13-LLE AKA  Microbiology summarized: 2/7-influenza A positive   Assessment and Plan: * PAD with bilateral claudication- (present on  admission) S/p angiogram and left AKA on 2/13.  She could have claudication from spinal stenosis as well. -Continue aspirin.  On Repatha due to statin intolerance -Insurance denied CIR after peer to peer review.  Appeal pending. -Pain control and bowel regimen  Elevated troponin- (present on admission) No chest pain.  No EKG changes to suggest acute ischemia.  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  Received heparin drip for 48 hours. -Cardiology recommended medical management and signed off. -On aspirin, Repatha and beta-blocker  Stroke-like symptoms- (present on admission) Patient had diplopia, LUE weakness and "clumsiness".  Had prior posterior decompression at L3-4 through L5-S1.  MRI brain without acute finding.  MRI thoracic and lumbar spine with some moderate to severe lumbar canal and foraminal stenosis.  Patient had patient's diplopia resolved.  No bowel or bladder habit change. -Outpatient follow-up with neurosurgery/orthopedic surgery  Influenza A- (present on admission) No respiratory symptoms.  No indication for treatment   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[place])- (present on admission) Recent Labs    03/01/21 0128 03/01/21 0423 03/02/21 0042 03/03/21 0333 03/03/21 1739 03/04/21 0134 03/05/21 0150 03/06/21 0140 03/07/21 0137 03/08/21 0214 03/09/21 0255  BUN 32* 30* 28* 26*  --  23 28* 24* 25* 28* 26*  CREATININE 1.76* 1.70* 1.58* 1.62* 1.56* 1.72* 1.63* 1.64* 1.69* 1.50* 1.59*  -Monitor   Type 2 diabetes mellitus with diabetic neuropathy, without long-term current use of insulin ([place])- (present  on admission) CBG markedly elevated. Recent Labs  Lab 03/08/21 1128 03/08/21 1644 03/08/21 2055 03/09/21 0613 03/09/21 1139  GLUCAP 276* 308* 235* 219* 391*  -Increase basal insulin from 15 units nightly to 15 units twice daily -Continue NovoLog from 4 to 6 units 3 times daily with meals -Continue SSI-moderate -Further adjustment as  appropriate.   Bilateral lower extremity edema- (present on admission) LE [place] negative for DVT.  Due to Lyrica? -Lyrica discontinued.  Diabetic peripheral neuropathy ([place])- (present on admission) -Lyrica discontinued out of concern for LLE edema.  Hyperlipidemia- (present on admission) On Repatha due to statin intolerance  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 female breast ([place])- (present on admission) Remote. -Outpatient follow-up  GERD- (present on admission) - Continue PPI  Coronary atherosclerosis- (present on admission) See elevated troponin  Essential hypertension- (present on admission) Normotensive. -Continue metoprolol  Hypothyroidism- (present on admission) -Continue home Synthroid  Diplopia-resolved as of 03/09/2021, (present on admission) Resolved.  Hyponatremia-resolved as of 03/09/2021, (present on admission) Resolved.     Increased nutrient needs Body mass index is 26.05 kg/m. Nutrition Problem: Increased nutrient needs Etiology: wound healing (L AKA) Signs/Symptoms: estimated needs Interventions: Ensure Enlive (each supplement provides 350kcal and 20 grams of protein), MVI   DVT prophylaxis:  heparin injection 5,000 Units Start: 03/06/21 2200 SCD's Start: 03/06/21 1229 SCD's Start: 03/03/21 1714 Place TED hose Start: 02/28/21 2159  Code Status: DNR/DNI Family Communication: Updated patient's husband at bedside. Level of care: Telemetry Surgical Status is: Inpatient Remains inpatient appropriate because: Lack of safe disposition/CIR     Final disposition: CIR once bed available      Consultants:  Vascular surgery Cardiology   Sch Meds:  Scheduled Meds:  aspirin EC  81 mg Oral Daily   docusate sodium  100 mg Oral BID   feeding supplement  237 mL Oral BID BM   heparin  5,000 Units Subcutaneous Q8H   insulin aspart  0-15 Units Subcutaneous TID WC   insulin aspart  0-5 Units Subcutaneous QHS   insulin aspart  6 Units  Subcutaneous TID WC   insulin glargine-yfgn  15 Units Subcutaneous BID   levothyroxine  100 mcg Oral Q0600   metoprolol tartrate  25 mg Oral BID   multivitamin with minerals  1 tablet Oral Daily   mupirocin ointment  1 application Nasal BID   pantoprazole  40 mg Oral Daily   sodium chloride flush  3 mL Intravenous Q12H   sodium chloride flush  3 mL Intravenous Q12H   traMADol  50 mg Oral Q12H   Continuous Infusions:  sodium chloride     sodium chloride     magnesium sulfate bolus IVPB     PRN Meds:.sodium chloride, sodium chloride, acetaminophen, alum & mag hydroxide-simeth, bisacodyl, calcium carbonate, guaiFENesin-dextromethorphan, hydrALAZINE, HYDROcodone-acetaminophen, labetalol, magnesium sulfate bolus IVPB, metoprolol tartrate, morphine injection, ondansetron (ZOFRAN) IV, phenol, polyethylene glycol, potassium chloride, senna-docusate, sodium chloride flush, sodium chloride flush  Antimicrobials: Anti-infectives (From admission, onward)    Start     Dose/Rate Route Frequency Ordered Stop   03/06/21 0914  ceFAZolin (ANCEF) 1-4 GM/50ML-% IVPB       Note to Pharmacy: Tamsen Snider M: cabinet override      03/06/21 0914 03/06/21 0930        I have personally reviewed the following labs and images: CBC: Recent Labs  Lab 03/05/21 0150 03/06/21 0140 03/07/21 0137 03/08/21 0214 03/09/21 0255  WBC 8.0 8.7 12.5* 9.6 14.3*  HGB 10.6* 10.7* 10.2* 9.9*  11.6*  HCT 32.8* 33.4* 32.0* 30.7* 37.5  MCV 91.9 91.0 92.0 93.0 94.7  PLT 184 227 230 270 353   BMP &GFR Recent Labs  Lab 03/05/21 0150 03/06/21 0140 03/07/21 0137 03/08/21 0214 03/09/21 0255  NA 134* 135 134* 135 135  K 4.2 4.3 5.1 4.5 4.8  CL 102 101 102 103 99  CO2 24 23 22 24 22   GLUCOSE 303* 252* 250* 229* 224*  BUN 28* 24* 25* 28* 26*  CREATININE 1.63* 1.64* 1.69* 1.50* 1.59*  CALCIUM 8.7* 9.1 8.7* 8.6* 9.0  MG  --   --   --   --  1.8  PHOS  --   --   --   --  2.8   Estimated Creatinine Clearance: 26.7  mL/min (A) (by C-G formula based on SCr of 1.59 mg/dL (H)). Liver & Pancreas: Recent Labs  Lab 03/09/21 0255  ALBUMIN 2.8*   No results for input(s): LIPASE, AMYLASE in the last 168 hours. No results for input(s): AMMONIA in the last 168 hours. Diabetic: No results for input(s): HGBA1C in the last 72 hours. Recent Labs  Lab 03/08/21 1128 03/08/21 1644 03/08/21 2055 03/09/21 0613 03/09/21 1139  GLUCAP 276* 308* 235* 219* 391*   Cardiac Enzymes: No results for input(s): CKTOTAL, CKMB, CKMBINDEX, TROPONINI in the last 168 hours. Recent Labs    09/01/20 1023 10/26/20 1137 02/21/21 1209  PROBNP 2,950* 2,308* 200.0*   Coagulation Profile: No results for input(s): INR, PROTIME in the last 168 hours. Thyroid Function Tests: No results for input(s): TSH, T4TOTAL, FREET4, T3FREE, THYROIDAB in the last 72 hours. Lipid Profile: No results for input(s): CHOL, HDL, LDLCALC, TRIG, CHOLHDL, LDLDIRECT in the last 72 hours. Anemia Panel: Recent Labs    03/09/21 0255  VITAMINB12 1,146*  FOLATE 23.0  FERRITIN 85  TIBC 333  IRON 49  RETICCTPCT 2.9   Urine analysis:    Component Value Date/Time   COLORURINE STRAW (A) 02/28/2021 2133   APPEARANCEUR CLEAR 02/28/2021 2133   LABSPEC 1.010 02/28/2021 2133   PHURINE 5.0 02/28/2021 2133   GLUCOSEU >=500 (A) 02/28/2021 2133   GLUCOSEU NEGATIVE 11/02/2020 0940   HGBUR NEGATIVE 02/28/2021 2133   BILIRUBINUR NEGATIVE 02/28/2021 2133   BILIRUBINUR negative 10/19/2020 1549   KETONESUR NEGATIVE 02/28/2021 2133   PROTEINUR NEGATIVE 02/28/2021 2133   UROBILINOGEN 0.2 11/02/2020 0940   NITRITE NEGATIVE 02/28/2021 2133   LEUKOCYTESUR NEGATIVE 02/28/2021 2133   Sepsis Labs: Invalid input(s): PROCALCITONIN, [place]  Microbiology: Recent Results (from the past 240 hour(s))  Resp Panel by RT-PCR (Flu A&B, Covid) Nasopharyngeal Swab     Status: Abnormal   Collection Time: 02/28/21  6:41 PM   Specimen: Nasopharyngeal Swab;  Nasopharyngeal(NP) swabs in vial transport medium  Result Value Ref Range Status   SARS Coronavirus 2 by RT PCR NEGATIVE NEGATIVE Final    Comment: (NOTE) SARS-CoV-2 target nucleic acids are NOT DETECTED.  The SARS-CoV-2 RNA is generally detectable in upper respiratory specimens during the acute phase of infection. The lowest concentration of SARS-CoV-2 viral copies this assay can detect is 138 copies/mL. A negative result does not preclude SARS-Cov-2 infection and should not be used as the sole basis for treatment or other patient management decisions. A negative result may occur with  improper specimen collection/handling, submission of specimen other than nasopharyngeal swab, presence of viral mutation(s) within the areas targeted by this assay, and inadequate number of viral copies(<138 copies/mL). A negative result must be combined with clinical observations, patient history,  and epidemiological information. The expected result is Negative.  Fact Sheet for Patients:  EntrepreneurPulse.com.au  Fact Sheet for Healthcare Providers:  IncredibleEmployment.be  This test is no t yet approved or cleared by the [place] FDA and  has been authorized for detection and/or diagnosis of SARS-CoV-2 by FDA under an Emergency Use Authorization (EUA). This EUA will remain  in effect (meaning this test can be used) for the duration of the COVID-19 declaration under Section 564(b)(1) of the Act, 21 U.S.C.section 360bbb-3(b)(1), unless the authorization is terminated  or revoked sooner.       Influenza A by PCR POSITIVE (A) NEGATIVE Final   Influenza B by PCR NEGATIVE NEGATIVE Final    Comment: (NOTE) The Xpert Xpress SARS-CoV-2/FLU/RSV plus assay is intended as an aid in the diagnosis of influenza from Nasopharyngeal swab specimens and should not be used as a sole basis for treatment. Nasal washings and aspirates are unacceptable for Xpert Xpress  SARS-CoV-2/FLU/RSV testing.  Fact Sheet for Patients: EntrepreneurPulse.com.au  Fact Sheet for Healthcare Providers: IncredibleEmployment.be  This test is not yet approved or cleared by the [place] FDA and has been authorized for detection and/or diagnosis of SARS-CoV-2 by FDA under an Emergency Use Authorization (EUA). This EUA will remain in effect (meaning this test can be used) for the duration of the COVID-19 declaration under Section 564(b)(1) of the Act, 21 U.S.C. section 360bbb-3(b)(1), unless the authorization is terminated or revoked.  Performed at [facility], [place] [address], [place], [place] [address]   Surgical pcr screen     Status: Abnormal   Collection Time: 03/05/21 11:26 AM   Specimen: Nasal Mucosa; Nasal Swab  Result Value Ref Range Status   MRSA, PCR NEGATIVE NEGATIVE Final   Staphylococcus aureus POSITIVE (A) NEGATIVE Final    Comment: (NOTE) The Xpert SA Assay (FDA approved for NASAL specimens in patients 83 years of age and older), is one component of a comprehensive surveillance program. It is not intended to diagnose infection nor to guide or monitor treatment. Performed at [facility], [place] [address], [place], [place] [address]     Radiology Studies: No results found.    Evander Macaraeg T. [place]  If 7PM-7AM, please contact night-coverage [email or web address] 03/09/2021, 3:02 PM

## 2021-03-09 NOTE — Assessment & Plan Note (Signed)
Resolved

## 2021-03-10 DIAGNOSIS — R778 Other specified abnormalities of plasma proteins: Secondary | ICD-10-CM | POA: Diagnosis not present

## 2021-03-10 DIAGNOSIS — D638 Anemia in other chronic diseases classified elsewhere: Secondary | ICD-10-CM | POA: Diagnosis not present

## 2021-03-10 DIAGNOSIS — I739 Peripheral vascular disease, unspecified: Secondary | ICD-10-CM | POA: Diagnosis not present

## 2021-03-10 DIAGNOSIS — E1122 Type 2 diabetes mellitus with diabetic chronic kidney disease: Secondary | ICD-10-CM | POA: Diagnosis not present

## 2021-03-10 LAB — GLUCOSE, CAPILLARY
Glucose-Capillary: 209 mg/dL — ABNORMAL HIGH (ref 70–99)
Glucose-Capillary: 329 mg/dL — ABNORMAL HIGH (ref 70–99)
Glucose-Capillary: 272 mg/dL — ABNORMAL HIGH (ref 70–99)
Glucose-Capillary: 374 mg/dL — ABNORMAL HIGH (ref 70–99)

## 2021-03-10 LAB — CBC
HCT: 32 % — ABNORMAL LOW (ref 36.0–46.0)
Hemoglobin: 10.3 g/dL — ABNORMAL LOW (ref 12.0–15.0)
MCH: 29.3 pg (ref 26.0–34.0)
MCHC: 32.2 g/dL (ref 30.0–36.0)
MCV: 91.2 fL (ref 80.0–100.0)
Platelets: 327 10*3/uL (ref 150–400)
RBC: 3.51 MIL/uL — ABNORMAL LOW (ref 3.87–5.11)
RDW: 15.4 % (ref 11.5–15.5)
WBC: 11.1 10*3/uL — ABNORMAL HIGH (ref 4.0–10.5)
nRBC: 0 % (ref 0.0–0.2)

## 2021-03-10 NOTE — Progress Notes (Signed)
PROGRESS NOTE  JAIDIN UGARTE QBH:[id number] DOB: [birth year]   PCP: [name], MD  Patient is from: Home  DOA: 02/28/2021 LOS: 9  Chief complaints:  Chief Complaint  Patient presents with   Weakness     Brief Narrative / Interim history: 81 y.o. female with medical history significant of DM2, HTN HLD, CAD sp CABG recent Covid.  Admitted for elevated troponin and leg weakness.  Found to have worsening ABIs and non-healing foot wounds as well as elevated troponin.  Elevated troponin treated medically.  S/p repeat angiogram and amputation by vascular.  Therapy recommended CIR but insurance denied even after peer to peer review.  Expedited appeal pending.      Subjective: No major events overnight of this morning.  No complaints but frustrated by prolonged hospital stay.   Objective: Vitals:   03/10/21 0739 03/10/21 0803 03/10/21 1111 03/10/21 1625  BP: (!) 147/70  117/73 (!) 153/75  Pulse: (!) 110  (!) 107 (!) 105  Resp: 20 20 20 18   Temp: 98.2 F (36.8 C)  (!) 97.4 F (36.3 C) 98.6 F (37 C)  TempSrc: Oral  Oral Oral  SpO2: 100%  97% 99%  Weight:      Height:        Examination:  GENERAL: No apparent distress.  Nontoxic. HEENT: MMM.  Vision and hearing grossly intact.  NECK: Supple.  No apparent JVD.  RESP:  No IWOB.  Fair aeration bilaterally. CVS: HR in 110s.  Regular rhythm. Heart sounds normal.  ABD/GI/GU: BS+. Abd soft, NTND.  MSK/EXT:  Moves extremities. S/p left AKA appears clean. SKIN: no apparent skin lesion or wound NEURO: Awake and alert. Oriented appropriately.  No apparent focal neuro deficit. PSYCH: Calm. Normal affect.   Procedures:  2/10-abdominal aortogram with lower extremity angiogram 2/13-LLE AKA  Microbiology summarized: 2/7-influenza A positive   Assessment and Plan: * PAD with bilateral claudication- (present on admission) S/p angiogram and left AKA on 2/13.  She could have claudication from spinal stenosis as well. -Continue  aspirin.  On Repatha due to statin intolerance -Insurance denied CIR after peer to peer review.  Appeal pending. -Pain control and bowel regimen  Elevated troponin- (present on admission) No chest pain.  No EKG changes to suggest acute ischemia.  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  Received heparin drip for 48 hours. -Cardiology recommended medical management and signed off. -On aspirin, Repatha and beta-blocker  Stroke-like symptoms- (present on admission) Patient had diplopia, LUE weakness and "clumsiness".  Had prior posterior decompression at L3-4 through L5-S1.  MRI brain without acute finding.  MRI thoracic and lumbar spine with some moderate to severe lumbar canal and foraminal stenosis.  Patient had patient's diplopia resolved.  No bowel or bladder habit change. -Outpatient follow-up with neurosurgery/orthopedic surgery  Influenza A- (present on admission) No respiratory symptoms.  No indication for treatment   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[place])- (present on admission) Recent Labs    03/01/21 0128 03/01/21 0423 03/02/21 0042 03/03/21 0973 03/03/21 1739 03/04/21 0134 03/05/21 0150 03/06/21 0140 03/07/21 0137 03/08/21 0214 03/09/21 0255  BUN 32* 30* 28* 26*  --  23 28* 24* 25* 28* 26*  CREATININE 1.76* 1.70* 1.58* 1.62* 1.56* 1.72* 1.63* 1.64* 1.69* 1.50* 1.59*  -Monitor   Type 2 diabetes mellitus with diabetic neuropathy, without long-term current use of insulin ([place])- (present on admission) CBG markedly elevated. Recent Labs  Lab 03/08/21 1128 03/08/21 1644 03/08/21 2055 03/09/21  9628 03/09/21 1139  GLUCAP 276* 308* 235* 219* 391*  -Increase basal insulin from 15 units nightly to 15 units twice daily -Continue NovoLog 6 units 3 times daily with meals -Continue SSI-moderate -Further adjustment as appropriate.   Bilateral lower extremity edema- (present on admission) LE [place] negative for DVT.  Due to Lyrica? -Lyrica  discontinued.  Diabetic peripheral neuropathy ([place])- (present on admission) -Lyrica discontinued out of concern for LLE edema.  Hyperlipidemia- (present on admission) On Repatha due to statin intolerance  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 female breast ([place])- (present on admission) Remote. -Outpatient follow-up  GERD- (present on admission) - Continue PPI  Coronary atherosclerosis- (present on admission) See elevated troponin  Essential hypertension- (present on admission) Normotensive. -Continue metoprolol  Hypothyroidism- (present on admission) -Continue home Synthroid  Diplopia-resolved as of 03/09/2021, (present on admission) Resolved.  Hyponatremia-resolved as of 03/09/2021, (present on admission) Resolved.     Increased nutrient needs Body mass index is 26.05 kg/m. Nutrition Problem: Increased nutrient needs Etiology: wound healing (L AKA) Signs/Symptoms: estimated needs Interventions: Ensure Enlive (each supplement provides 350kcal and 20 grams of protein), MVI   DVT prophylaxis:  heparin injection 5,000 Units Start: 03/06/21 2200 SCD's Start: 03/06/21 1229 SCD's Start: 03/03/21 1714 Place TED hose Start: 02/28/21 2159  Code Status: DNR/DNI Family Communication: None at bedside today. Level of care: Telemetry Surgical Status is: Inpatient Remains inpatient appropriate because: Lack of safe disposition/CIR     Final disposition: CIR?      Consultants:  Vascular surgery Cardiology   Sch Meds:  Scheduled Meds:  aspirin EC  81 mg Oral Daily   docusate sodium  100 mg Oral BID   feeding supplement  237 mL Oral BID BM   heparin  5,000 Units Subcutaneous Q8H   insulin aspart  0-15 Units Subcutaneous TID WC   insulin aspart  0-5 Units Subcutaneous QHS   insulin aspart  6 Units Subcutaneous TID WC   insulin glargine-yfgn  15 Units Subcutaneous BID   levothyroxine  100 mcg Oral Q0600   metoprolol tartrate  25 mg Oral BID    multivitamin with minerals  1 tablet Oral Daily   mupirocin ointment  1 application Nasal BID   pantoprazole  40 mg Oral Daily   sodium chloride flush  3 mL Intravenous Q12H   sodium chloride flush  3 mL Intravenous Q12H   traMADol  50 mg Oral Q12H   Continuous Infusions:  sodium chloride     sodium chloride     magnesium sulfate bolus IVPB     PRN Meds:.sodium chloride, sodium chloride, acetaminophen, alum & mag hydroxide-simeth, bisacodyl, calcium carbonate, guaiFENesin-dextromethorphan, hydrALAZINE, HYDROcodone-acetaminophen, labetalol, magnesium sulfate bolus IVPB, metoprolol tartrate, morphine injection, ondansetron (ZOFRAN) IV, phenol, polyethylene glycol, potassium chloride, senna-docusate, sodium chloride flush, sodium chloride flush  Antimicrobials: Anti-infectives (From admission, onward)    Start     Dose/Rate Route Frequency Ordered Stop   03/06/21 0914  ceFAZolin (ANCEF) 1-4 GM/50ML-% IVPB       Note to Pharmacy: Tamsen Snider M: cabinet override      03/06/21 0914 03/06/21 0930        I have personally reviewed the following labs and images: CBC: Recent Labs  Lab 03/06/21 0140 03/07/21 0137 03/08/21 0214 03/09/21 0255 03/10/21 0216  WBC 8.7 12.5* 9.6 14.3* 11.1*  HGB 10.7* 10.2* 9.9* 11.6* 10.3*  HCT 33.4* 32.0* 30.7* 37.5 32.0*  MCV 91.0 92.0 93.0 94.7 91.2  PLT 227 230 270 353 327  BMP &GFR Recent Labs  Lab 03/05/21 0150 03/06/21 0140 03/07/21 0137 03/08/21 0214 03/09/21 0255  NA 134* 135 134* 135 135  K 4.2 4.3 5.1 4.5 4.8  CL 102 101 102 103 99  CO2 24 23 22 24 22   GLUCOSE 303* 252* 250* 229* 224*  BUN 28* 24* 25* 28* 26*  CREATININE 1.63* 1.64* 1.69* 1.50* 1.59*  CALCIUM 8.7* 9.1 8.7* 8.6* 9.0  MG  --   --   --   --  1.8  PHOS  --   --   --   --  2.8   Estimated Creatinine Clearance: 26.7 mL/min (A) (by C-G formula based on SCr of 1.59 mg/dL (H)). Liver & Pancreas: Recent Labs  Lab 03/09/21 0255  ALBUMIN 2.8*   No results for  input(s): LIPASE, AMYLASE in the last 168 hours. No results for input(s): AMMONIA in the last 168 hours. Diabetic: No results for input(s): HGBA1C in the last 72 hours. Recent Labs  Lab 03/09/21 1627 03/09/21 2126 03/10/21 0642 03/10/21 1107 03/10/21 1621  GLUCAP 280* 358* 209* 329* 272*   Cardiac Enzymes: No results for input(s): CKTOTAL, CKMB, CKMBINDEX, TROPONINI in the last 168 hours. Recent Labs    09/01/20 1023 10/26/20 1137 02/21/21 1209  PROBNP 2,950* 2,308* 200.0*   Coagulation Profile: No results for input(s): INR, PROTIME in the last 168 hours. Thyroid Function Tests: No results for input(s): TSH, T4TOTAL, FREET4, T3FREE, THYROIDAB in the last 72 hours. Lipid Profile: No results for input(s): CHOL, HDL, LDLCALC, TRIG, CHOLHDL, LDLDIRECT in the last 72 hours. Anemia Panel: Recent Labs    03/09/21 0255  VITAMINB12 1,146*  FOLATE 23.0  FERRITIN 85  TIBC 333  IRON 49  RETICCTPCT 2.9   Urine analysis:    Component Value Date/Time   COLORURINE STRAW (A) 02/28/2021 2133   APPEARANCEUR CLEAR 02/28/2021 2133   LABSPEC 1.010 02/28/2021 2133   PHURINE 5.0 02/28/2021 2133   GLUCOSEU >=500 (A) 02/28/2021 2133   GLUCOSEU NEGATIVE 11/02/2020 0940   HGBUR NEGATIVE 02/28/2021 2133   BILIRUBINUR NEGATIVE 02/28/2021 2133   BILIRUBINUR negative 10/19/2020 1549   KETONESUR NEGATIVE 02/28/2021 2133   PROTEINUR NEGATIVE 02/28/2021 2133   UROBILINOGEN 0.2 11/02/2020 0940   NITRITE NEGATIVE 02/28/2021 2133   LEUKOCYTESUR NEGATIVE 02/28/2021 2133   Sepsis Labs: Invalid input(s): PROCALCITONIN, [place]  Microbiology: Recent Results (from the past 240 hour(s))  Resp Panel by RT-PCR (Flu A&B, Covid) Nasopharyngeal Swab     Status: Abnormal   Collection Time: 02/28/21  6:41 PM   Specimen: Nasopharyngeal Swab; Nasopharyngeal(NP) swabs in vial transport medium  Result Value Ref Range Status   SARS Coronavirus 2 by RT PCR NEGATIVE NEGATIVE Final    Comment:  (NOTE) SARS-CoV-2 target nucleic acids are NOT DETECTED.  The SARS-CoV-2 RNA is generally detectable in upper respiratory specimens during the acute phase of infection. The lowest concentration of SARS-CoV-2 viral copies this assay can detect is 138 copies/mL. A negative result does not preclude SARS-Cov-2 infection and should not be used as the sole basis for treatment or other patient management decisions. A negative result may occur with  improper specimen collection/handling, submission of specimen other than nasopharyngeal swab, presence of viral mutation(s) within the areas targeted by this assay, and inadequate number of viral copies(<138 copies/mL). A negative result must be combined with clinical observations, patient history, and epidemiological information. The expected result is Negative.  Fact Sheet for Patients:  EntrepreneurPulse.com.au  Fact Sheet for Healthcare Providers:  IncredibleEmployment.be  This test is no t yet approved or cleared by the [place] and  has been authorized for detection and/or diagnosis of SARS-CoV-2 by FDA under an Emergency Use Authorization (EUA). This EUA will remain  in effect (meaning this test can be used) for the duration of the COVID-19 declaration under Section 564(b)(1) of the Act, 21 U.S.C.section 360bbb-3(b)(1), unless the authorization is terminated  or revoked sooner.       Influenza A by PCR POSITIVE (A) NEGATIVE Final   Influenza B by PCR NEGATIVE NEGATIVE Final    Comment: (NOTE) The Xpert Xpress SARS-CoV-2/FLU/RSV plus assay is intended as an aid in the diagnosis of influenza from Nasopharyngeal swab specimens and should not be used as a sole basis for treatment. Nasal washings and aspirates are unacceptable for Xpert Xpress SARS-CoV-2/FLU/RSV testing.  Fact Sheet for Patients: EntrepreneurPulse.com.au  Fact Sheet for Healthcare  Providers: IncredibleEmployment.be  This test is not yet approved or cleared by the [place] FDA and has been authorized for detection and/or diagnosis of SARS-CoV-2 by FDA under an Emergency Use Authorization (EUA). This EUA will remain in effect (meaning this test can be used) for the duration of the COVID-19 declaration under Section 564(b)(1) of the Act, 21 U.S.C. section 360bbb-3(b)(1), unless the authorization is terminated or revoked.  Performed at [facility], [place] [address] [place], [place] [address]   Surgical pcr screen     Status: Abnormal   Collection Time: 03/05/21 11:26 AM   Specimen: Nasal Mucosa; Nasal Swab  Result Value Ref Range Status   MRSA, PCR NEGATIVE NEGATIVE Final   Staphylococcus aureus POSITIVE (A) NEGATIVE Final    Comment: (NOTE) The Xpert SA Assay (FDA approved for NASAL specimens in patients 67 years of age and older), is one component of a comprehensive surveillance program. It is not intended to diagnose infection nor to guide or monitor treatment. Performed at [facility], [place] [address], [place], [place] [address]     Radiology Studies: No results found.    Wendelin Reader T. [place]  If 7PM-7AM, please contact night-coverage [email or web address] 03/10/2021, 6:20 PM

## 2021-03-10 NOTE — Progress Notes (Signed)
°  Progress Note    03/10/2021 7:41 AM 4 Days Post-Op  Subjective:  no complaints  Afebrile  Vitals:   03/09/21 2333 03/10/21 0407  BP: (!) 171/84 (!) 147/68  Pulse: 100 100  Resp: 20 18  Temp: 98.2 F (36.8 C) 97.8 F (36.6 C)  SpO2: 97% 98%    Physical Exam: Incisions:  dressing removed and incision looks great with staples in tact.    CBC    Component Value Date/Time   WBC 11.1 (H) 03/10/2021 0216   RBC 3.51 (L) 03/10/2021 0216   HGB 10.3 (L) 03/10/2021 0216   HGB 12.7 08/24/2020 1005   HGB 12.0 02/21/2016 1035   HCT 32.0 (L) 03/10/2021 0216   HCT 38.8 08/24/2020 1005   HCT 36.4 02/21/2016 1035   PLT 327 03/10/2021 0216   PLT 226 08/24/2020 1005   MCV 91.2 03/10/2021 0216   MCV 88 08/24/2020 1005   MCV 87.4 02/21/2016 1035   MCH 29.3 03/10/2021 0216   MCHC 32.2 03/10/2021 0216   RDW 15.4 03/10/2021 0216   RDW 13.3 08/24/2020 1005   RDW 14.2 02/21/2016 1035   LYMPHSABS 3.1 03/01/2021 0423   LYMPHSABS 2.6 08/24/2020 1005   LYMPHSABS 2.6 02/21/2016 1035   MONOABS 1.2 (H) 03/01/2021 0423   MONOABS 0.7 02/21/2016 1035   EOSABS 0.2 03/01/2021 0423   EOSABS 0.5 (H) 08/24/2020 1005   BASOSABS 0.0 03/01/2021 0423   BASOSABS 0.1 08/24/2020 1005   BASOSABS 0.1 02/21/2016 1035    BMET    Component Value Date/Time   NA 135 03/09/2021 0255   NA 138 10/26/2020 1137   NA 138 02/21/2016 1035   K 4.8 03/09/2021 0255   K 4.5 02/21/2016 1035   CL 99 03/09/2021 0255   CL 106 04/10/2012 0921   CO2 22 03/09/2021 0255   CO2 24 02/21/2016 1035   GLUCOSE 224 (H) 03/09/2021 0255   GLUCOSE 194 (H) 02/21/2016 1035   GLUCOSE 216 (H) 04/10/2012 0921   BUN 26 (H) 03/09/2021 0255   BUN 32 (H) 10/26/2020 1137   BUN 18.7 02/21/2016 1035   CREATININE 1.59 (H) 03/09/2021 0255   CREATININE 1.4 (H) 02/21/2016 1035   CALCIUM 9.0 03/09/2021 0255   CALCIUM 9.0 02/21/2016 1035   GFRNONAA 33 (L) 03/09/2021 0255   GFRAA 32 (L) 03/11/2020 0913    INR    Component Value  Date/Time   INR 1.4 (H) 05/04/2020 1544     Intake/Output Summary (Last 24 hours) at 03/10/2021 0741 Last data filed at 03/10/2021 0407 Gross per 24 hour  Intake 510 ml  Output 700 ml  Net -190 ml     Assessment/Plan:  81 y.o. female is s/p left above knee amputation  4 Days Post-Op  -dressing removed and incision healing nicely with staples in tact -okay for rehab. -pt will f/u in 4 weeks with [facility] to have staples removed.  Our office will arrange appt.   -please call if any vascular concerns arise.   Leontine Locket, PA-C [facility] [phone] 03/10/2021 7:41 AM

## 2021-03-10 NOTE — Progress Notes (Signed)
Inpatient Rehab Admissions Coordinator:   Spoke to daughter to update.  No word from expedited appeal yet but would expect an answer no later than end of day tomorrow.  Will continue to follow.   Shann Medal, PT, DPT Admissions Coordinator [phone] 03/10/21  2:14 PM

## 2021-03-10 NOTE — Progress Notes (Signed)
Physical Therapy Treatment Patient Details Name: [name] MRN: [id number] DOB: 1940-05-20 Today's Date: 03/10/2021   History of Present Illness Pt is a 81 y.o. female admitted for  elevated troponin and leg weakness. Found to have AKI and NSTEMI. 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 PMH includes CAD (s/p CABG, AVR), DM2, breast CA, CKD IV, HTN, OA, PAD, RA, stenosis, neuropathy, Covid.    PT Comments    Patient progressing well towards PT goals. Highly motivated to work with therapy. Session focused on progressive ambulation, transfers and there ex in standing/sidelying. Min A needed for stability/balance with activity. HR up to 136 bpm. Recommend OOB bed to chair for all meals and to [facility] for toileting. Tech made aware. Encouraged exercises in sidelying throughout the day to improve AROM and strengthening of residual limb. Continue to recommend AIR as pt is a great candidate, highly motivated and will likely get to Mod I level prior to return home. Will follow.    Recommendations for follow up therapy are one component of a multi-disciplinary discharge planning process, led by the attending physician.  Recommendations may be updated based on patient status, additional functional criteria and insurance authorization.  Follow Up Recommendations  Acute inpatient rehab (3hours/day)     Assistance Recommended at Discharge Intermittent Supervision/Assistance  Patient can return home with the following A little help with walking and/or transfers;Help with stairs or ramp for entrance   Equipment Recommendations  Wheelchair (measurements PT);Wheelchair cushion (measurements PT);BSC/3in1    Recommendations for Other Services       Precautions / Restrictions Precautions Precautions: Fall;Other (comment) Precaution Comments: left AKA Restrictions Weight Bearing Restrictions: Yes LLE Weight Bearing: Non weight bearing     Mobility  Bed Mobility Overal bed mobility: Needs Assistance Bed Mobility:  Supine to Sit, Sit to Supine     Supine to sit: Min guard, HOB elevated Sit to supine: Min guard, HOB elevated   General bed mobility comments: INcreased time and use of rail to get to EOB due to pain.    Transfers Overall transfer level: Needs assistance Equipment used: Rolling walker (2 wheels) Transfers: Sit to/from Stand Sit to Stand: Min assist           General transfer comment: Min A to power to standing with cues for hand placement. Stood from [employer].    Ambulation/Gait Ambulation/Gait assistance: Min assist Gait Distance (Feet): 16 Feet Assistive device: Rolling walker (2 wheels) Gait Pattern/deviations: Step-to pattern Gait velocity: decreased Gait velocity interpretation: <1.31 ft/sec, indicative of household ambulator   General Gait Details: Hop to pattern with walker with min assist for balance and support. HR up to 136 bpm with activity.   Stairs             Wheelchair Mobility    Modified Rankin (Stroke Patients Only)       Balance Overall balance assessment: Needs assistance Sitting-balance support: Feet supported, No upper extremity supported Sitting balance-Leahy Scale: Fair Sitting balance - Comments: Dynamic sitting balance improving   Standing balance support: During functional activity Standing balance-Leahy Scale: Poor Standing balance comment: [place] UE support in standing.                            Cognition Arousal/Alertness: Awake/alert Behavior During Therapy: WFL for tasks assessed/performed Overall Cognitive Status: Within Functional Limits for tasks assessed  Exercises Amputee Exercises Hip Extension: AROM, Left, Standing, Sidelying, 10 reps Hip ABduction/ADduction: AROM, Left, Standing, Sidelying, 10 reps    General Comments General comments (skin integrity, edema, etc.): Spouse present at end of session. HR up to 136 bpm with activity.       Pertinent Vitals/Pain Pain Assessment Pain Assessment: 0-10 Pain Score: 8  Pain Location: left residual limb with movement Pain Descriptors / Indicators: Sore, Burning Pain Intervention(s): Repositioned, Monitored during session    Home Living                          Prior Function            PT Goals (current goals can now be found in the care plan section) Progress towards PT goals: Progressing toward goals    Frequency    Min 3X/week      PT Plan Current plan remains appropriate    Co-evaluation              AM-PAC PT "6 Clicks" Mobility   Outcome Measure  Help needed turning from your back to your side while in a flat bed without using bedrails?: A Little Help needed moving from lying on your back to sitting on the side of a flat bed without using bedrails?: A Little Help needed moving to and from a bed to a chair (including a wheelchair)?: A Little Help needed standing up from a chair using your arms (e.g., wheelchair or bedside chair)?: A Little Help needed to walk in hospital room?: A Little Help needed climbing 3-5 steps with a railing? : Total 6 Click Score: 16    End of Session Equipment Utilized During Treatment: Gait belt Activity Tolerance: Patient tolerated treatment well Patient left: in bed;with call bell/phone within reach;with family/visitor present Nurse Communication: Mobility status PT Visit Diagnosis: Other abnormalities of gait and mobility (R26.89);Pain;Unsteadiness on feet (R26.81) Pain - Right/Left: Left Pain - part of body: Leg     Time: 9485-4627 PT Time Calculation (min) (ACUTE ONLY): 24 min  Charges:  $Gait Training: 8-22 mins $Therapeutic Activity: 8-22 mins                     Marisa Severin, PT, DPT Acute Rehabilitation Services Pager [phone] Office [place] 03/10/2021, 9:15 AM

## 2021-03-10 NOTE — PMR Pre-admission (Signed)
PMR Admission Coordinator Pre-Admission Assessment  Patient: [name] is an 81 y.o., female MRN: [id number] DOB: [birth year] Height: _0  (160 cm) Weight: 66.7 kg  Insurance Information HMO:     PPO: yes     PCP:      IPA:      80/20:      OTHER:  PRIMARY: U[employer] Medicare      Policy#: [id number]      Subscriber: pt CM Name: Hilda Blades      Phone#: [phone]     Fax#: [phone] Pre-Cert#: [id number] [place] for CIR from [place] with updates due to fax listed above on 2/27      Employer:  Benefits:  Phone #: [phone]     Name:  Eff. Date: 01/22/21     Deduct: $200  (met)    Out of Pocket Max: $2200       Life Max: n/a CIR: 100%      SNF: 100 days Outpatient: 100%     Co-Pay:  Home Health: 100%      Co-Pay:  DME: 100%     Co-Pay:  Providers:  SECONDARY: n/a      Policy#:      Phone#:   Development worker, community:       Phone#:   [employer] for patients in Inpatient Rehabilitation Facilities with attached Privacy Act [place] Records was provided and verbally reviewed with: Patient  Emergency Contact Information Contact Information     Name Relation Home Work Mobile   [place] Spouse [phone]  [phone]   [place] Daughter   [phone]       Current Medical History  Patient Admitting Diagnosis: AKA, NSTEMI, flu  History of Present Illness: Pt is a 81 y/o female with PMH of DM, HTN, CAD s/p CABG, recent COVID, who was admitted to [facility] on 2/7 with LE weakness and chest pain.  ED workup revealed elevated troponin and NSTEMI.  No further chest pain or EKG changes to suggest ischemia.  Received heparin drip for 48 hours.  Cardiology consulted, recommended medical management, and signed off.  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  Workup negative for LE weakness.  Therapy evaluations completed and pt was recommended for CIR.      Patient's medical record from [facility] has been reviewed by the  rehabilitation admission coordinator and physician.  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, inv mammary, in situ,ER/PR +,HER2 -   Cancer ([place])    Carcinoma of breast treated with adjuvant chemotherapy ([place])    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[place])    Claudication ([place])    Coronary artery disease    stent 2007   Depression    Diabetes mellitus    Difficulty sleeping    Diverticulosis 12/10/03   Elevated cholesterol    Generalized weakness    GERD (gastroesophageal reflux disease)    Heart murmur    Hepatitis    as an infant   HSV-1 (herpes simplex virus 1) infection    Hyperlipidemia    Hypertension    Hypothyroidism    Osteoarthritis    PAD (peripheral artery disease) ([place])    Personal  history of chemotherapy    Personal history of radiation therapy    Pneumonia    2014 september and March 2022   Rheumatoid arthritis ([place])    S/P aortic valve replacement with bioprosthetic valve 05/04/2020   [facility] Inspiris Resilia stented bovine pericardial tissue valve, size 23 mm   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 Yes  Family History   family history includes Alcoholism in her father; Cancer in her son; Diabetes in her brother; Heart attack in her brother and father; Heart disease in her mother; Hypertension in her brother; Kidney disease in her mother; Lymphoma (age of onset: 67) in her brother; Throat cancer in her father.  Current Medications  Current Facility-Administered Medications:    0.9 %  sodium chloride infusion, 250 mL, Intravenous, PRN, Theda Sers, Emma M, PA-C   0.9 %   sodium chloride infusion, 250 mL, Intravenous, PRN, Theda Sers, Emma M, PA-C   acetaminophen (TYLENOL) tablet 325-650 mg, 325-650 mg, Oral, Q6H PRN, Theda Sers, Emma M, PA-C   alum & mag hydroxide-simeth (MAALOX/MYLANTA) 200-200-20 MG/5ML suspension 15-30 mL, 15-30 mL, Oral, Q2H PRN, Theda Sers, Emma M, PA-C   aspirin EC tablet 81 mg, 81 mg, Oral, Daily, Laurence Slate M, PA-C, 81 mg at 03/10/21 9562   bisacodyl (DULCOLAX) suppository 10 mg, 10 mg, Rectal, Daily PRN, Mercy Riding, MD   calcium carbonate (TUMS - dosed in mg elemental calcium) chewable tablet 200 mg of elemental calcium, 1 tablet, Oral, TID PRN, Laurence Slate M, PA-C, 200 mg of elemental calcium at 03/05/21 2055   docusate sodium (COLACE) capsule 100 mg, 100 mg, Oral, BID, Laurence Slate M, PA-C, 100 mg at 03/10/21 1308   feeding supplement (ENSURE ENLIVE / ENSURE PLUS) liquid 237 mL, 237 mL, Oral, BID BM, Collins, Emma M, PA-C, 237 mL at 03/10/21 0907   guaiFENesin-dextromethorphan (ROBITUSSIN DM) 100-10 MG/5ML syrup 15 mL, 15 mL, Oral, Q4H PRN, Theda Sers, Emma M, PA-C   heparin injection 5,000 Units, 5,000 Units, Subcutaneous, Q8H, Collins, Emma M, PA-C, 5,000 Units at 03/10/21 6578   hydrALAZINE (APRESOLINE) injection 5 mg, 5 mg, Intravenous, Q20 Min PRN, Theda Sers, Emma M, PA-C   HYDROcodone-acetaminophen (NORCO) 7.5-325 MG per tablet 1-2 tablet, 1-2 tablet, Oral, Q4H PRN, Theda Sers, Emma M, PA-C, 1 tablet at 03/09/21 2049   insulin aspart (novoLOG) injection 0-15 Units, 0-15 Units, Subcutaneous, TID WC, Collins, Emma M, PA-C, 11 Units at 03/10/21 1157   insulin aspart (novoLOG) injection 0-5 Units, 0-5 Units, Subcutaneous, QHS, Collins, Emma M, PA-C, 5 Units at 03/09/21 2146   insulin aspart (novoLOG) injection 6 Units, 6 Units, Subcutaneous, TID WC, Gonfa, Taye T, MD, 6 Units at 03/10/21 1157   insulin glargine-yfgn (SEMGLEE) injection 15 Units, 15 Units, Subcutaneous, BID, Gonfa, Taye T, MD, 15 Units at 03/10/21 0907   labetalol (NORMODYNE)  injection 10 mg, 10 mg, Intravenous, Q10 min PRN, Theda Sers, Emma M, PA-C, 10 mg at 03/09/21 2341   levothyroxine (SYNTHROID) tablet 100 mcg, 100 mcg, Oral, Q0600, Ulyses Amor, PA-C, 100 mcg at 03/10/21 4696   magnesium sulfate IVPB 2 g 50 mL, 2 g, Intravenous, Daily PRN, Theda Sers, Emma M, PA-C   metoprolol tartrate (LOPRESSOR) injection 2-5 mg, 2-5 mg, Intravenous, Q2H PRN, Laurence Slate  M, PA-C   metoprolol tartrate (LOPRESSOR) tablet 25 mg, 25 mg, Oral, BID, Laurence Slate M, PA-C, 25 mg at 03/10/21 0347   morphine (PF) 2 MG/ML injection 0.5-1 mg, 0.5-1 mg, Intravenous, Q2H PRN, Laurence Slate M, PA-C, 1 mg at 03/06/21 1811   multivitamin with minerals tablet 1 tablet, 1 tablet, Oral, Daily, Laurence Slate M, PA-C, 1 tablet at 03/10/21 0906   mupirocin ointment (BACTROBAN) 2 % 1 application, 1 application, Nasal, BID, Vann, Jessica U, DO, 1 application at 42/59/56 0907   ondansetron (ZOFRAN) injection 4 mg, 4 mg, Intravenous, Q6H PRN, Laurence Slate M, PA-C, 4 mg at 03/08/21 0950   pantoprazole (PROTONIX) EC tablet 40 mg, 40 mg, Oral, Daily, Laurence Slate M, PA-C, 40 mg at 03/10/21 0907   phenol (CHLORASEPTIC) mouth spray 1 spray, 1 spray, Mouth/Throat, PRN, Theda Sers, Emma M, PA-C   polyethylene glycol (MIRALAX / GLYCOLAX) packet 17 g, 17 g, Oral, BID PRN, Wendee Beavers T, MD, 17 g at 03/09/21 3875   potassium chloride SA (KLOR-CON M) CR tablet 20-40 mEq, 20-40 mEq, Oral, Daily PRN, Theda Sers, Emma M, PA-C   senna-docusate (Senokot-S) tablet 1 tablet, 1 tablet, Oral, BID PRN, Mercy Riding, MD, 1 tablet at 03/09/21 0920   sodium chloride flush (NS) 0.9 % injection 3 mL, 3 mL, Intravenous, Q12H, Collins, Emma M, PA-C, 3 mL at 03/10/21 0907   sodium chloride flush (NS) 0.9 % injection 3 mL, 3 mL, Intravenous, PRN, Theda Sers, Emma M, PA-C   sodium chloride flush (NS) 0.9 % injection 3 mL, 3 mL, Intravenous, Q12H, Collins, Emma M, PA-C, 3 mL at 03/10/21 6433   sodium chloride flush (NS) 0.9 % injection 3 mL, 3  mL, Intravenous, PRN, Theda Sers, Emma M, PA-C   traMADol Veatrice Bourbon) tablet 50 mg, 50 mg, Oral, Q12H, Collins, Emma M, PA-C, 50 mg at 03/10/21 2951  Patients Current Diet:  Diet Order             Diet heart healthy/carb modified Room service appropriate? Yes; Fluid consistency: Thin  Diet effective now                   Precautions / Restrictions Precautions Precautions: Fall, Other (comment) Precaution Comments: left AKA Restrictions Weight Bearing Restrictions: Yes LLE Weight Bearing: Non weight bearing   Has the patient had 2 or more falls or a fall with injury in the past year? Yes  Prior Activity Level Limited Community (1-2x/wk): no DME used, furniture surfs at home, uses carts in community, mod I with ADLs  Prior Functional Level Self Care: Did the patient need help bathing, dressing, using the toilet or eating? Independent  Indoor Mobility: Did the patient need assistance with walking from room to room (with or without device)? Independent  Stairs: Did the patient need assistance with internal or external stairs (with or without device)? Independent  Functional Cognition: Did the patient need help planning regular tasks such as shopping or remembering to take medications? Independent  Patient Information Are you of Hispanic, Latino/a,or Spanish origin?: A. No, not of Hispanic, Latino/a, or Spanish origin What is your race?: A. White Do you need or want an interpreter to communicate with a doctor or health care staff?: 0. No  Patient's Response To:  Health Literacy and Transportation Is the patient able to respond to health literacy and transportation needs?: Yes Health Literacy - How often do you need to have someone help you when you read instructions, pamphlets, or other written material from your  doctor or pharmacy?: Rarely In the past 12 months, has lack of transportation kept you from medical appointments or from getting medications?: No In the past 12 months,  has lack of transportation kept you from meetings, work, or from getting things needed for daily living?: No  Development worker, international aid / [place] Devices/Equipment: None Home Equipment: Conservation officer, nature (2 wheels), Other (comment) (reports this was her mother's walker so may benefit from getting new equipment)  Prior Device Use: Indicate devices/aids used by the patient prior to current illness, exacerbation or injury? None of the above  Current Functional Level Cognition  Overall Cognitive Status: Within Functional Limits for tasks assessed Orientation Level: Oriented X4    Extremity Assessment (includes Sensation/Coordination)  Upper Extremity Assessment: Overall WFL for tasks assessed  Lower Extremity Assessment: Generalized weakness    ADLs  Overall ADL's : Needs assistance/impaired Eating/Feeding: Modified independent Grooming: Set up, Sitting Upper Body Bathing: Set up Lower Body Bathing: Minimal assistance, Sitting/lateral leans Upper Body Dressing : Set up Lower Body Dressing: Minimal assistance, Sitting/lateral leans Toilet Transfer: BSC/3in1, Moderate assistance Toileting- Clothing Manipulation and Hygiene: Minimal assistance Functional mobility during ADLs: Moderate assistance, Cueing for safety General ADL Comments: Ambulated @ 60 ft    Mobility  Overal bed mobility: Needs Assistance Bed Mobility: Supine to Sit, Sit to Supine Supine to sit: Min guard, HOB elevated Sit to supine: Min guard, HOB elevated General bed mobility comments: INcreased time and use of rail to get to EOB due to pain.    Transfers  Overall transfer level: Needs assistance Equipment used: Rolling walker (2 wheels) Transfers: Sit to/from Stand Sit to Stand: Min assist Bed to/from chair/wheelchair/BSC transfer type:: Stand pivot Stand pivot transfers: Min assist  Lateral/Scoot Transfers: Min guard General transfer comment: Min A to power to standing with cues for hand placement.  Stood from [employer].    Ambulation / Gait / Stairs / Wheelchair Mobility  Ambulation/Gait Ambulation/Gait assistance: Herbalist (Feet): 16 Feet Assistive device: Rolling walker (2 wheels) Gait Pattern/deviations: Step-to pattern General Gait Details: Hop to pattern with walker with min assist for balance and support. HR up to 136 bpm with activity. Gait velocity: decreased Gait velocity interpretation: <1.31 ft/sec, indicative of household ambulator    Posture / Balance Dynamic Sitting Balance Sitting balance - Comments: Dynamic sitting balance improving Balance Overall balance assessment: Needs assistance Sitting-balance support: Feet supported, No upper extremity supported Sitting balance-Leahy Scale: Fair Sitting balance - Comments: Dynamic sitting balance improving Postural control: Posterior lean Standing balance support: During functional activity Standing balance-Leahy Scale: Poor Standing balance comment: [place]iers UE support in standing.    Special needs/care consideration Skin L AKA incision and Diabetic management yes   Previous Home Environment (from acute therapy documentation) Living Arrangements: Spouse/significant other Available Help at Discharge: Family, Available 24 hours/day Type of Home: House Home Layout: One level Home Access: Stairs to enter Entrance Stairs-Rails: Right, Left Entrance Stairs-Number of Steps: 5 Bathroom Shower/Tub: Chiropodist: Handicapped height Bathroom Accessibility: Yes Home Care Services: No Additional Comments: Has access to tub seat if needed  Discharge Living Setting Plans for Discharge Living Setting: Patient's home, Lives with (comment) (spouse) Type of Home at Discharge: House Discharge Home Layout: One level Discharge Home Access: Stairs to enter Entrance Stairs-Rails: Right, Left Entrance Stairs-Number of Steps: 5 Discharge Bathroom Shower/Tub: Tub/shower unit Discharge Bathroom  Toilet: Handicapped height Discharge Bathroom Accessibility: Yes How Accessible: Accessible via walker Does the patient have any problems obtaining  your medications?: No  Social/Family/Support Systems Anticipated Caregiver: [name] (daughter) is primary contact Anticipated Caregiver's Contact Information: [phone] Ability/Limitations of Caregiver: n/a Caregiver Availability: 24/7 Discharge Plan Discussed with Primary Caregiver: Yes Is Caregiver In Agreement with Plan?: Yes Does Caregiver/Family have Issues with Lodging/Transportation while Pt is in Rehab?: No  Goals Patient/Family Goal for Rehab: PT/OT supervision to mod I w/c level, supervision ambulation; SLP n/a Expected length of stay: 10-12 days Pt/Family Agrees to Admission and willing to participate: Yes Program Orientation Provided & Reviewed with Pt/Caregiver Including Roles  & Responsibilities: Yes  Barriers to Discharge: Insurance for SNF coverage  Decrease burden of Care through IP rehab admission: na  Possible need for SNF placement upon discharge: not anticipated  Patient Condition: I have reviewed medical records from [facility], spoken with  [facility] team , and patient and daughter. I discussed via phone for inpatient rehabilitation assessment.  Patient will benefit from ongoing PT and OT, can actively participate in 3 hours of therapy a day 5 days of the week, and can make measurable gains during the admission.  Patient will also benefit from the coordinated team approach during an Inpatient Acute Rehabilitation admission.  The patient will receive intensive therapy as well as Rehabilitation physician, nursing, social worker, and care management interventions.  Due to safety, skin/wound care, disease management, medication administration, pain management, and patient education the patient requires 24 hour a day rehabilitation nursing.  The patient is currently min assist with mobility and basic ADLs.  Discharge setting and  therapy post discharge at home with home health is anticipated.  Patient has agreed to participate in the Acute Inpatient Rehabilitation Program and will admit today.   Preadmission Screen Completed By:  Shann Medal, PT, DPT and Michel Santee, 03/10/2021 2:21 PM ______________________________________________________________________   Discussed status with Dr. Ranell Patrick on 03/13/21  at 12:07 PM and received approval for admission today.  Admission Coordinator:  Michel Santee, PT, time 12:07 PM Sudie Grumbling 03/13/21    Assessment/Plan: Diagnosis: Debility Does the need for close, 24 hr/day Medical supervision in concert with the patient's rehab needs make it unreasonable for this patient to be served in a less intensive setting? Yes Co-Morbidities requiring supervision/potential complications: PAD with bilateral claudication, hypothyroidism, type 2 DM, essential HTN, chronic GERD Due to bladder management, bowel management, safety, skin/wound care, disease management, medication administration, pain management, and patient education, does the patient require 24 hr/day rehab nursing? Yes Does the patient require coordinated care of a physician, rehab nurse, PT, OT to address physical and functional deficits in the context of the above medical diagnosis(es)? Yes Addressing deficits in the following areas: balance, endurance, locomotion, strength, transferring, bowel/bladder control, bathing, dressing, feeding, grooming, toileting, and psychosocial support Can the patient actively participate in an intensive therapy program of at least 3 hrs of therapy 5 days a week? Yes The potential for patient to make measurable gains while on inpatient rehab is excellent Anticipated functional outcomes upon discharge from inpatient rehab: modified independent PT, modified independent OT, independent SLP Estimated rehab length of stay to reach the above functional goals is: 10-12 days Anticipated discharge  destination: Home 10. Overall Rehab/Functional Prognosis: excellent   MD Signature: Leeroy Cha, MD

## 2021-03-11 DIAGNOSIS — R000 Tachycardia, unspecified: Secondary | ICD-10-CM

## 2021-03-11 LAB — GLUCOSE, CAPILLARY
Glucose-Capillary: 244 mg/dL — ABNORMAL HIGH (ref 70–99)
Glucose-Capillary: 266 mg/dL — ABNORMAL HIGH (ref 70–99)
Glucose-Capillary: 211 mg/dL — ABNORMAL HIGH (ref 70–99)
Glucose-Capillary: 236 mg/dL — ABNORMAL HIGH (ref 70–99)

## 2021-03-11 LAB — CBC
HCT: 29.8 % — ABNORMAL LOW (ref 36.0–46.0)
Hemoglobin: 9.2 g/dL — ABNORMAL LOW (ref 12.0–15.0)
MCH: 29.1 pg (ref 26.0–34.0)
MCHC: 30.9 g/dL (ref 30.0–36.0)
MCV: 94.3 fL (ref 80.0–100.0)
Platelets: 346 10*3/uL (ref 150–400)
RBC: 3.16 MIL/uL — ABNORMAL LOW (ref 3.87–5.11)
RDW: 15.4 % (ref 11.5–15.5)
WBC: 8.9 10*3/uL (ref 4.0–10.5)
nRBC: 0 % (ref 0.0–0.2)

## 2021-03-11 MED ORDER — LABETALOL HCL 5 MG/ML IV SOLN
10.0000 mg | INTRAVENOUS | Status: DC | PRN
  Administered 2021-03-12 (×2): 10 mg via INTRAVENOUS
  Filled 2021-03-11 (×2): qty 4

## 2021-03-11 MED ORDER — INSULIN GLARGINE-YFGN 100 UNIT/ML ~~LOC~~ SOLN
18.0000 [IU] | Freq: Two times a day (BID) | SUBCUTANEOUS | Status: DC
  Administered 2021-03-11 – 2021-03-12 (×3): 18 [IU] via SUBCUTANEOUS
  Filled 2021-03-11 (×5): qty 0.18

## 2021-03-11 MED ORDER — PAROXETINE HCL 20 MG PO TABS
20.0000 mg | ORAL_TABLET | Freq: Every morning | ORAL | Status: DC
  Administered 2021-03-11 – 2021-03-13 (×3): 20 mg via ORAL
  Filled 2021-03-11 (×3): qty 1

## 2021-03-11 MED ORDER — METOPROLOL TARTRATE 25 MG PO TABS
37.5000 mg | ORAL_TABLET | Freq: Two times a day (BID) | ORAL | Status: DC
  Administered 2021-03-11 – 2021-03-13 (×4): 37.5 mg via ORAL
  Filled 2021-03-11 (×4): qty 1

## 2021-03-11 MED ORDER — INSULIN ASPART 100 UNIT/ML IJ SOLN
8.0000 [IU] | Freq: Three times a day (TID) | INTRAMUSCULAR | Status: DC
  Administered 2021-03-11 – 2021-03-13 (×8): 8 [IU] via SUBCUTANEOUS

## 2021-03-11 NOTE — Progress Notes (Signed)
°  Progress Note    03/11/2021 7:43 AM 5 Days Post-Op  Subjective:  says she had a pity party yesterday but better this morning.  Says she lost a daughter 12 years ago and was started on Paxil and she has not been taking it since being in the hospital.  Says the dressing on her leg fell off 3x last night.    Afebrile  Vitals:   03/10/21 2314 03/11/21 0324  BP: 134/62 (!) 162/71  Pulse: 100 90  Resp: 17 16  Temp: 98.1 F (36.7 C) 97.8 F (36.6 C)  SpO2: 98% 99%    Physical Exam: Incisions:  dressing not removed today as it was just put on earlier.   CBC    Component Value Date/Time   WBC 8.9 03/11/2021 0152   RBC 3.16 (L) 03/11/2021 0152   HGB 9.2 (L) 03/11/2021 0152   HGB 12.7 08/24/2020 1005   HGB 12.0 02/21/2016 1035   HCT 29.8 (L) 03/11/2021 0152   HCT 38.8 08/24/2020 1005   HCT 36.4 02/21/2016 1035   PLT 346 03/11/2021 0152   PLT 226 08/24/2020 1005   MCV 94.3 03/11/2021 0152   MCV 88 08/24/2020 1005   MCV 87.4 02/21/2016 1035   MCH 29.1 03/11/2021 0152   MCHC 30.9 03/11/2021 0152   RDW 15.4 03/11/2021 0152   RDW 13.3 08/24/2020 1005   RDW 14.2 02/21/2016 1035   LYMPHSABS 3.1 03/01/2021 0423   LYMPHSABS 2.6 08/24/2020 1005   LYMPHSABS 2.6 02/21/2016 1035   MONOABS 1.2 (H) 03/01/2021 0423   MONOABS 0.7 02/21/2016 1035   EOSABS 0.2 03/01/2021 0423   EOSABS 0.5 (H) 08/24/2020 1005   BASOSABS 0.0 03/01/2021 0423   BASOSABS 0.1 08/24/2020 1005   BASOSABS 0.1 02/21/2016 1035    BMET    Component Value Date/Time   NA 135 03/09/2021 0255   NA 138 10/26/2020 1137   NA 138 02/21/2016 1035   K 4.8 03/09/2021 0255   K 4.5 02/21/2016 1035   CL 99 03/09/2021 0255   CL 106 04/10/2012 0921   CO2 22 03/09/2021 0255   CO2 24 02/21/2016 1035   GLUCOSE 224 (H) 03/09/2021 0255   GLUCOSE 194 (H) 02/21/2016 1035   GLUCOSE 216 (H) 04/10/2012 0921   BUN 26 (H) 03/09/2021 0255   BUN 32 (H) 10/26/2020 1137   BUN 18.7 02/21/2016 1035   CREATININE 1.59 (H)  03/09/2021 0255   CREATININE 1.4 (H) 02/21/2016 1035   CALCIUM 9.0 03/09/2021 0255   CALCIUM 9.0 02/21/2016 1035   GFRNONAA 33 (L) 03/09/2021 0255   GFRAA 32 (L) 03/11/2020 0913    INR    Component Value Date/Time   INR 1.4 (H) 05/04/2020 1544     Intake/Output Summary (Last 24 hours) at 03/11/2021 0743 Last data filed at 03/11/2021 6045 Gross per 24 hour  Intake 390 ml  Output 300 ml  Net 90 ml     Assessment/Plan:  81 y.o. female is s/p left above knee amputation  5 Days Post-Op  -pt doing better this morning.  I did reorder her Paxil. -hopeful that insurance will approve CIR today -f/u in 4 weeks for staple removal.  Our office has arranged appt.    Leontine Locket, PA-C [facility] [phone] 03/11/2021 7:43 AM

## 2021-03-11 NOTE — Assessment & Plan Note (Addendum)
Improved. -Increased metoprolol to 50 mg twice daily

## 2021-03-11 NOTE — Progress Notes (Signed)
PROGRESS NOTE  [name] MWU:[id number] DOB: [birth year]   PCP: [name], MD  Patient is from: Home  DOA: 02/28/2021 LOS: 90  Chief complaints:  Chief Complaint  Patient presents with   Weakness     Brief Narrative / Interim history: 81 y.o. female with medical history significant of DM2, HTN HLD, CAD sp CABG recent Covid.  Admitted for elevated troponin and leg weakness.  Found to have worsening ABIs and non-healing foot wounds as well as elevated troponin.  Elevated troponin treated medically.  S/p repeat angiogram and amputation by vascular.  Therapy recommended CIR but insurance denied even after peer to peer review.  Expedited appeal pending.      Subjective: No major events overnight of this morning.  No complaints.  Frustrated about prolonged hospital stay as she wait on insurance appeal for CIR.  Pain fairly controlled.  Denies chest pain, dyspnea, GI or UTI symptoms.  Objective: Vitals:   03/11/21 0853 03/11/21 1127 03/11/21 1223 03/11/21 1231  BP: (!) 147/73 (!) 180/73 124/69 124/69  Pulse: 100 (!) 112 98   Resp: 17 16 (!) 21 14  Temp: 97.6 F (36.4 C) 97.9 F (36.6 C) 97.9 F (36.6 C)   TempSrc: Oral Oral    SpO2: 100% 100% 100% 100%  Weight:      Height:        Examination:  GENERAL: No apparent distress.  Nontoxic. HEENT: MMM.  Vision and hearing grossly intact.  NECK: Supple.  No apparent JVD.  RESP:  No IWOB.  Fair aeration bilaterally. CVS: HR 100-1 110s.  Regular rhythm.  Heart sounds normal.  ABD/GI/GU: BS+. Abd soft, NTND.  MSK/EXT: Status post left AKA. SKIN: no apparent skin lesion or wound NEURO: Awake and alert. Oriented appropriately.  No apparent focal neuro deficit. PSYCH: Calm. Normal affect.   Procedures:  2/10-abdominal aortogram with lower extremity angiogram 2/13-LLE AKA  Microbiology summarized: 2/7-influenza A positive   Assessment and Plan: * PAD with bilateral claudication- (present on admission) S/p angiogram  and left AKA on 2/13.  She could have claudication from spinal stenosis as well. -Continue aspirin.  On Repatha due to statin intolerance -Insurance denied CIR after peer to peer review.  Appeal pending. -Pain control and bowel regimen  Elevated troponin- (present on admission) No chest pain.  No EKG changes to suggest acute ischemia.  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  Received heparin drip for 48 hours. -Cardiology recommended medical management and signed off. -On aspirin, Repatha and beta-blocker  Sinus tachycardia Could be partly autonomic -Increase metoprolol to 37.5 mg twice daily.  Stroke-like symptoms- (present on admission) Patient had diplopia, LUE weakness and "clumsiness".  Had prior posterior decompression at L3-4 through L5-S1.  MRI brain without acute finding.  MRI thoracic and lumbar spine with some moderate to severe lumbar canal and foraminal stenosis.  Patient had patient's diplopia resolved.  No bowel or bladder habit change. -Outpatient follow-up with neurosurgery/orthopedic surgery  Influenza A- (present on admission) No respiratory symptoms.  No indication for treatment   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[place])- (present on admission) Recent Labs    03/01/21 0128 03/01/21 0423 03/02/21 0042 03/03/21 7253 03/03/21 1739 03/04/21 0134 03/05/21 0150 03/06/21 0140 03/07/21 0137 03/08/21 0214 03/09/21 0255  BUN 32* 30* 28* 26*  --  23 28* 24* 25* 28* 26*  CREATININE 1.76* 1.70* 1.58* 1.62* 1.56* 1.72* 1.63* 1.64* 1.69* 1.50* 1.59*  -Monitor   Type 2  diabetes mellitus with diabetic neuropathy, without long-term current use of insulin ([place])- (present on admission) CBG markedly elevated. Recent Labs  Lab 03/10/21 1107 03/10/21 1621 03/10/21 2127 03/11/21 0619 03/11/21 1128  GLUCAP 329* 272* 374* 244* 266*  -Increase basal insulin from 15 units twice daily to 18 units twice daily -Increase NovoLog from 6 units to 8 units  3 times daily with meals -Continue SSI-moderate -Further adjustment as appropriate.   Bilateral lower extremity edema- (present on admission) LE [place] negative for DVT.  Due to Lyrica? -Lyrica discontinued.  Diabetic peripheral neuropathy ([place])- (present on admission) -Lyrica discontinued out of concern for LLE edema.  Hyperlipidemia- (present on admission) On Repatha due to statin intolerance  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 female breast ([place])- (present on admission) Remote. -Outpatient follow-up  GERD- (present on admission) - Continue PPI  Coronary atherosclerosis- (present on admission) See elevated troponin  Essential hypertension- (present on admission) Metoprolol as above.   Hypothyroidism- (present on admission) -Continue home Synthroid  Diplopia-resolved as of 03/09/2021, (present on admission) Resolved.  Hyponatremia-resolved as of 03/09/2021, (present on admission) Resolved.     Increased nutrient needs Body mass index is 26.05 kg/m. Nutrition Problem: Increased nutrient needs Etiology: wound healing (L AKA) Signs/Symptoms: estimated needs Interventions: Ensure Enlive (each supplement provides 350kcal and 20 grams of protein), MVI   DVT prophylaxis:  heparin injection 5,000 Units Start: 03/06/21 2200 SCD's Start: 03/06/21 1229 SCD's Start: 03/03/21 1714 Place TED hose Start: 02/28/21 2159  Code Status: DNR/DNI Family Communication: None at bedside today. Level of care: Telemetry Surgical Status is: Inpatient Remains inpatient appropriate because: Lack of safe disposition/CIR     Final disposition: CIR?      Consultants:  Vascular surgery Cardiology   Sch Meds:  Scheduled Meds:  aspirin EC  81 mg Oral Daily   docusate sodium  100 mg Oral BID   feeding supplement  237 mL Oral BID BM   heparin  5,000 Units Subcutaneous Q8H   insulin aspart  0-15 Units Subcutaneous TID WC   insulin aspart  0-5 Units Subcutaneous QHS    insulin aspart  8 Units Subcutaneous TID WC   insulin glargine-yfgn  18 Units Subcutaneous BID   levothyroxine  100 mcg Oral Q0600   metoprolol tartrate  37.5 mg Oral BID   multivitamin with minerals  1 tablet Oral Daily   mupirocin ointment  1 application Nasal BID   pantoprazole  40 mg Oral Daily   PARoxetine  20 mg Oral q AM   sodium chloride flush  3 mL Intravenous Q12H   sodium chloride flush  3 mL Intravenous Q12H   traMADol  50 mg Oral Q12H   Continuous Infusions:  sodium chloride     sodium chloride     magnesium sulfate bolus IVPB     PRN Meds:.sodium chloride, sodium chloride, acetaminophen, alum & mag hydroxide-simeth, bisacodyl, calcium carbonate, guaiFENesin-dextromethorphan, HYDROcodone-acetaminophen, labetalol, magnesium sulfate bolus IVPB, morphine injection, ondansetron (ZOFRAN) IV, phenol, polyethylene glycol, potassium chloride, senna-docusate, sodium chloride flush, sodium chloride flush  Antimicrobials: Anti-infectives (From admission, onward)    Start     Dose/Rate Route Frequency Ordered Stop   03/06/21 0914  ceFAZolin (ANCEF) 1-4 GM/50ML-% IVPB       Note to Pharmacy: Tamsen Snider M: cabinet override      03/06/21 0914 03/06/21 0930        I have personally reviewed the following labs and images: CBC: Recent Labs  Lab 03/07/21 0137 03/08/21 0214 03/09/21  0255 03/10/21 0216 03/11/21 0152  WBC 12.5* 9.6 14.3* 11.1* 8.9  HGB 10.2* 9.9* 11.6* 10.3* 9.2*  HCT 32.0* 30.7* 37.5 32.0* 29.8*  MCV 92.0 93.0 94.7 91.2 94.3  PLT 230 270 353 327 346   BMP &GFR Recent Labs  Lab 03/05/21 0150 03/06/21 0140 03/07/21 0137 03/08/21 0214 03/09/21 0255  NA 134* 135 134* 135 135  K 4.2 4.3 5.1 4.5 4.8  CL 102 101 102 103 99  CO2 24 23 22 24 22   GLUCOSE 303* 252* 250* 229* 224*  BUN 28* 24* 25* 28* 26*  CREATININE 1.63* 1.64* 1.69* 1.50* 1.59*  CALCIUM 8.7* 9.1 8.7* 8.6* 9.0  MG  --   --   --   --  1.8  PHOS  --   --   --   --  2.8   Estimated  Creatinine Clearance: 26.7 mL/min (A) (by C-G formula based on SCr of 1.59 mg/dL (H)). Liver & Pancreas: Recent Labs  Lab 03/09/21 0255  ALBUMIN 2.8*   No results for input(s): LIPASE, AMYLASE in the last 168 hours. No results for input(s): AMMONIA in the last 168 hours. Diabetic: No results for input(s): HGBA1C in the last 72 hours. Recent Labs  Lab 03/10/21 1107 03/10/21 1621 03/10/21 2127 03/11/21 0619 03/11/21 1128  GLUCAP 329* 272* 374* 244* 266*   Cardiac Enzymes: No results for input(s): CKTOTAL, CKMB, CKMBINDEX, TROPONINI in the last 168 hours. Recent Labs    09/01/20 1023 10/26/20 1137 02/21/21 1209  PROBNP 2,950* 2,308* 200.0*   Coagulation Profile: No results for input(s): INR, PROTIME in the last 168 hours. Thyroid Function Tests: No results for input(s): TSH, T4TOTAL, FREET4, T3FREE, THYROIDAB in the last 72 hours. Lipid Profile: No results for input(s): CHOL, HDL, LDLCALC, TRIG, CHOLHDL, LDLDIRECT in the last 72 hours. Anemia Panel: Recent Labs    03/09/21 0255  VITAMINB12 1,146*  FOLATE 23.0  FERRITIN 85  TIBC 333  IRON 49  RETICCTPCT 2.9   Urine analysis:    Component Value Date/Time   COLORURINE STRAW (A) 02/28/2021 2133   APPEARANCEUR CLEAR 02/28/2021 2133   LABSPEC 1.010 02/28/2021 2133   PHURINE 5.0 02/28/2021 2133   GLUCOSEU >=500 (A) 02/28/2021 2133   GLUCOSEU NEGATIVE 11/02/2020 0940   HGBUR NEGATIVE 02/28/2021 2133   BILIRUBINUR NEGATIVE 02/28/2021 2133   BILIRUBINUR negative 10/19/2020 1549   KETONESUR NEGATIVE 02/28/2021 2133   PROTEINUR NEGATIVE 02/28/2021 2133   UROBILINOGEN 0.2 11/02/2020 0940   NITRITE NEGATIVE 02/28/2021 2133   LEUKOCYTESUR NEGATIVE 02/28/2021 2133   Sepsis Labs: Invalid input(s): PROCALCITONIN, [place]  Microbiology: Recent Results (from the past 240 hour(s))  Surgical pcr screen     Status: Abnormal   Collection Time: 03/05/21 11:26 AM   Specimen: Nasal Mucosa; Nasal Swab  Result Value Ref  Range Status   MRSA, PCR NEGATIVE NEGATIVE Final   Staphylococcus aureus POSITIVE (A) NEGATIVE Final    Comment: (NOTE) The Xpert SA Assay (FDA approved for NASAL specimens in patients 22 years of age and older), is one component of a comprehensive surveillance program. It is not intended to diagnose infection nor to guide or monitor treatment. Performed at [facility], [place] [address], [place], [place] [address]     Radiology Studies: No results found.    Jaden Batchelder T. [place]  If 7PM-7AM, please contact night-coverage [email or web address] 03/11/2021, 3:00 PM

## 2021-03-12 LAB — CBC
HCT: 31.6 % — ABNORMAL LOW (ref 36.0–46.0)
Hemoglobin: 10 g/dL — ABNORMAL LOW (ref 12.0–15.0)
MCH: 29.4 pg (ref 26.0–34.0)
MCHC: 31.6 g/dL (ref 30.0–36.0)
MCV: 92.9 fL (ref 80.0–100.0)
Platelets: 373 10*3/uL (ref 150–400)
RBC: 3.4 MIL/uL — ABNORMAL LOW (ref 3.87–5.11)
RDW: 15.5 % (ref 11.5–15.5)
WBC: 9.4 10*3/uL (ref 4.0–10.5)
nRBC: 0 % (ref 0.0–0.2)

## 2021-03-12 LAB — RENAL FUNCTION PANEL
Albumin: 2.7 g/dL — ABNORMAL LOW (ref 3.5–5.0)
Anion gap: 10 (ref 5–15)
BUN: 31 mg/dL — ABNORMAL HIGH (ref 8–23)
CO2: 23 mmol/L (ref 22–32)
Calcium: 8.8 mg/dL — ABNORMAL LOW (ref 8.9–10.3)
Chloride: 100 mmol/L (ref 98–111)
Creatinine, Ser: 1.26 mg/dL — ABNORMAL HIGH (ref 0.44–1.00)
GFR, Estimated: 44 mL/min — ABNORMAL LOW (ref >60)
Glucose, Bld: 222 mg/dL — ABNORMAL HIGH (ref 70–99)
Phosphorus: 3.2 mg/dL (ref 2.5–4.6)
Potassium: 4.7 mmol/L (ref 3.5–5.1)
Sodium: 133 mmol/L — ABNORMAL LOW (ref 135–145)

## 2021-03-12 LAB — GLUCOSE, CAPILLARY
Glucose-Capillary: 191 mg/dL — ABNORMAL HIGH (ref 70–99)
Glucose-Capillary: 253 mg/dL — ABNORMAL HIGH (ref 70–99)
Glucose-Capillary: 213 mg/dL — ABNORMAL HIGH (ref 70–99)
Glucose-Capillary: 260 mg/dL — ABNORMAL HIGH (ref 70–99)
Glucose-Capillary: 160 mg/dL — ABNORMAL HIGH (ref 70–99)

## 2021-03-12 LAB — MAGNESIUM: Magnesium: 1.8 mg/dL (ref 1.7–2.4)

## 2021-03-12 MED ORDER — INSULIN GLARGINE-YFGN 100 UNIT/ML ~~LOC~~ SOLN
22.0000 [IU] | Freq: Two times a day (BID) | SUBCUTANEOUS | Status: DC
  Administered 2021-03-12: 22 [IU] via SUBCUTANEOUS
  Filled 2021-03-12 (×3): qty 0.22

## 2021-03-12 NOTE — Progress Notes (Signed)
° ° °  RE:   [name]      Date of Birth: 05/29/2040      Date:  03/12/21        To Whom It May Concern:  Please be advised that the above-named patient will require a short-term nursing home stay - anticipated 30 days or less for rehabilitation and strengthening.  The plan is for return home.                 MD signature                Date

## 2021-03-12 NOTE — Progress Notes (Signed)
PROGRESS NOTE  [name] WUJ:[id number] DOB: [birth year]   PCP: [name], MD  Patient is from: Home  DOA: 02/28/2021 LOS: 10  Chief complaints:  Chief Complaint  Patient presents with   Weakness     Brief Narrative / Interim history: 81 y.o. female with medical history significant of DM2, HTN HLD, CAD sp CABG recent Covid.  Admitted for elevated troponin and leg weakness.  Found to have worsening ABIs and non-healing foot wounds as well as elevated troponin.  Elevated troponin treated medically.  S/p repeat angiogram and amputation by vascular.  Therapy recommended CIR but insurance denied even after peer to peer review.  Expedited appeal pending.      Subjective: Seen and examined earlier this morning.  No major events overnight of this morning.  No complaints.  Eagerly waiting on the outcome of insurance appeal.  Objective: Vitals:   03/12/21 0144 03/12/21 0358 03/12/21 0824 03/12/21 1201  BP: 130/68 134/65 139/65 131/69  Pulse:  93 93 81  Resp: 18 12 14 14   Temp:  98.2 F (36.8 C) 97.9 F (36.6 C)   TempSrc:  Oral Oral   SpO2:  97% 97% 98%  Weight:      Height:        Examination:  GENERAL: No apparent distress.  Nontoxic. HEENT: MMM.  Vision and hearing grossly intact.  NECK: Supple.  No apparent JVD.  RESP:  No IWOB.  Fair aeration bilaterally. CVS:  RRR. Heart sounds normal.  ABD/GI/GU: BS+. Abd soft, NTND.  MSK/EXT: Left AKA. SKIN: no apparent skin lesion or wound NEURO: Awake and alert. Oriented appropriately.  No apparent focal neuro deficit. PSYCH: Calm. Normal affect.   Procedures:  2/10-abdominal aortogram with lower extremity angiogram 2/13-LLE AKA  Microbiology summarized: 2/7-influenza A positive   Assessment and Plan: * PAD with bilateral claudication- (present on admission) S/p angiogram and left AKA on 2/13.  She could have claudication from spinal stenosis as well. -Continue aspirin.  On Repatha due to statin  intolerance -Insurance denied CIR after peer to peer review.  Appeal pending. -Pain control and bowel regimen  Elevated troponin- (present on admission) No chest pain.  No EKG changes to suggest acute ischemia.  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  Received heparin drip for 48 hours. -Cardiology recommended medical management and signed off. -On aspirin, Repatha and beta-blocker  Sinus tachycardia Resolved. -Increased metoprolol to 37.5 mg twice daily on 2/18.  Stroke-like symptoms- (present on admission) Patient had diplopia, LUE weakness and "clumsiness".  Had prior posterior decompression at L3-4 through L5-S1.  MRI brain without acute finding.  MRI thoracic and lumbar spine with some moderate to severe lumbar canal and foraminal stenosis.  Patient had patient's diplopia resolved.  No bowel or bladder habit change. -Outpatient follow-up with neurosurgery/orthopedic surgery  Influenza A- (present on admission) No respiratory symptoms.  No indication for treatment or isolation precaution  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[place])- (present on admission) Recent Labs    03/01/21 0423 03/02/21 0042 03/03/21 0333 03/03/21 1739 03/04/21 0134 03/05/21 0150 03/06/21 0140 03/07/21 0137 03/08/21 0214 03/09/21 0255 03/12/21 0132  BUN 30* 28* 26*  --  23 28* 24* 25* 28* 26* 31*  CREATININE 1.70* 1.58* 1.62* 1.56* 1.72* 1.63* 1.64* 1.69* 1.50* 1.59* 1.26*  -Monitor   Type 2 diabetes mellitus with diabetic neuropathy, without long-term current use of insulin ([place])- (present on admission) CBG markedly elevated. Recent Labs  Lab 03/11/21 1618  03/11/21 2123 03/12/21 0607 03/12/21 0822 03/12/21 1159  GLUCAP 211* 236* 191* 253* 213*  -Increase basal insulin from from 18-22 units twice daily -Continue NovoLog at 8 units 3 times daily with meals -Continue SSI-moderate -Further adjustment as appropriate.   Hyponatremia Partly from  hyperglycemia. -Monitor  Bilateral lower extremity edema- (present on admission) LE [place] negative for DVT.  Due to Lyrica? -Lyrica discontinued.  Diabetic peripheral neuropathy ([place])- (present on admission) -Lyrica discontinued out of concern for LLE edema.  Hyperlipidemia- (present on admission) On Repatha due to statin intolerance  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 female breast ([place])- (present on admission) Remote. -Outpatient follow-up  GERD- (present on admission) - Continue PPI  Coronary atherosclerosis- (present on admission) See elevated troponin  Essential hypertension- (present on admission) Metoprolol as above.   Hypothyroidism- (present on admission) -Continue home Synthroid  Diplopia-resolved as of 03/09/2021, (present on admission) Resolved.     Increased nutrient needs Body mass index is 26.05 kg/m. Nutrition Problem: Increased nutrient needs Etiology: wound healing (L AKA) Signs/Symptoms: estimated needs Interventions: Ensure Enlive (each supplement provides 350kcal and 20 grams of protein), MVI   DVT prophylaxis:  heparin injection 5,000 Units Start: 03/06/21 2200 SCD's Start: 03/06/21 1229 SCD's Start: 03/03/21 1714 Place TED hose Start: 02/28/21 2159  Code Status: DNR/DNI Family Communication: Updated patient's husband at bedside. Level of care: Telemetry Surgical Status is: Inpatient Remains inpatient appropriate because: Lack of safe disposition/CIR     Final disposition: CIR?      Consultants:  Vascular surgery Cardiology   Sch Meds:  Scheduled Meds:  aspirin EC  81 mg Oral Daily   docusate sodium  100 mg Oral BID   feeding supplement  237 mL Oral BID BM   heparin  5,000 Units Subcutaneous Q8H   insulin aspart  0-15 Units Subcutaneous TID WC   insulin aspart  0-5 Units Subcutaneous QHS   insulin aspart  8 Units Subcutaneous TID WC   insulin glargine-yfgn  22 Units Subcutaneous BID   levothyroxine  100 mcg  Oral Q0600   metoprolol tartrate  37.5 mg Oral BID   multivitamin with minerals  1 tablet Oral Daily   pantoprazole  40 mg Oral Daily   PARoxetine  20 mg Oral q AM   sodium chloride flush  3 mL Intravenous Q12H   sodium chloride flush  3 mL Intravenous Q12H   traMADol  50 mg Oral Q12H   Continuous Infusions:  sodium chloride     sodium chloride     magnesium sulfate bolus IVPB     PRN Meds:.sodium chloride, sodium chloride, acetaminophen, alum & mag hydroxide-simeth, bisacodyl, calcium carbonate, guaiFENesin-dextromethorphan, HYDROcodone-acetaminophen, labetalol, magnesium sulfate bolus IVPB, morphine injection, ondansetron (ZOFRAN) IV, phenol, polyethylene glycol, potassium chloride, senna-docusate, sodium chloride flush, sodium chloride flush  Antimicrobials: Anti-infectives (From admission, onward)    Start     Dose/Rate Route Frequency Ordered Stop   03/06/21 0914  ceFAZolin (ANCEF) 1-4 GM/50ML-% IVPB       Note to Pharmacy: Tamsen Snider M: cabinet override      03/06/21 0914 03/06/21 0930        I have personally reviewed the following labs and images: CBC: Recent Labs  Lab 03/08/21 0214 03/09/21 0255 03/10/21 0216 03/11/21 0152 03/12/21 0132  WBC 9.6 14.3* 11.1* 8.9 9.4  HGB 9.9* 11.6* 10.3* 9.2* 10.0*  HCT 30.7* 37.5 32.0* 29.8* 31.6*  MCV 93.0 94.7 91.2 94.3 92.9  PLT 270 353 327 346 373  BMP &GFR Recent Labs  Lab 03/06/21 0140 03/07/21 0137 03/08/21 0214 03/09/21 0255 03/12/21 0132  NA 135 134* 135 135 133*  K 4.3 5.1 4.5 4.8 4.7  CL 101 102 103 99 100  CO2 23 22 24 22 23   GLUCOSE 252* 250* 229* 224* 222*  BUN 24* 25* 28* 26* 31*  CREATININE 1.64* 1.69* 1.50* 1.59* 1.26*  CALCIUM 9.1 8.7* 8.6* 9.0 8.8*  MG  --   --   --  1.8 1.8  PHOS  --   --   --  2.8 3.2   Estimated Creatinine Clearance: 33.8 mL/min (A) (by C-G formula based on SCr of 1.26 mg/dL (H)). Liver & Pancreas: Recent Labs  Lab 03/09/21 0255 03/12/21 0132  ALBUMIN 2.8* 2.7*    No results for input(s): LIPASE, AMYLASE in the last 168 hours. No results for input(s): AMMONIA in the last 168 hours. Diabetic: No results for input(s): HGBA1C in the last 72 hours. Recent Labs  Lab 03/11/21 1618 03/11/21 2123 03/12/21 0607 03/12/21 0822 03/12/21 1159  GLUCAP 211* 236* 191* 253* 213*   Cardiac Enzymes: No results for input(s): CKTOTAL, CKMB, CKMBINDEX, TROPONINI in the last 168 hours. Recent Labs    09/01/20 1023 10/26/20 1137 02/21/21 1209  PROBNP 2,950* 2,308* 200.0*   Coagulation Profile: No results for input(s): INR, PROTIME in the last 168 hours. Thyroid Function Tests: No results for input(s): TSH, T4TOTAL, FREET4, T3FREE, THYROIDAB in the last 72 hours. Lipid Profile: No results for input(s): CHOL, HDL, LDLCALC, TRIG, CHOLHDL, LDLDIRECT in the last 72 hours. Anemia Panel: No results for input(s): VITAMINB12, FOLATE, FERRITIN, TIBC, IRON, RETICCTPCT in the last 72 hours.  Urine analysis:    Component Value Date/Time   COLORURINE STRAW (A) 02/28/2021 2133   APPEARANCEUR CLEAR 02/28/2021 2133   LABSPEC 1.010 02/28/2021 2133   PHURINE 5.0 02/28/2021 2133   GLUCOSEU >=500 (A) 02/28/2021 2133   GLUCOSEU NEGATIVE 11/02/2020 0940   HGBUR NEGATIVE 02/28/2021 2133   BILIRUBINUR NEGATIVE 02/28/2021 2133   BILIRUBINUR negative 10/19/2020 1549   KETONESUR NEGATIVE 02/28/2021 2133   PROTEINUR NEGATIVE 02/28/2021 2133   UROBILINOGEN 0.2 11/02/2020 0940   NITRITE NEGATIVE 02/28/2021 2133   LEUKOCYTESUR NEGATIVE 02/28/2021 2133   Sepsis Labs: Invalid input(s): PROCALCITONIN, [place]  Microbiology: Recent Results (from the past 240 hour(s))  Surgical pcr screen     Status: Abnormal   Collection Time: 03/05/21 11:26 AM   Specimen: Nasal Mucosa; Nasal Swab  Result Value Ref Range Status   MRSA, PCR NEGATIVE NEGATIVE Final   Staphylococcus aureus POSITIVE (A) NEGATIVE Final    Comment: (NOTE) The Xpert SA Assay (FDA approved for NASAL  specimens in patients 56 years of age and older), is one component of a comprehensive surveillance program. It is not intended to diagnose infection nor to guide or monitor treatment. Performed at [facility], [place] [address], [place], [place] [address]     Radiology Studies: No results found.    Rashonda Warrior T. [place]  If 7PM-7AM, please contact night-coverage [email or web address] 03/12/2021, 2:50 PM

## 2021-03-12 NOTE — NC FL2 (Signed)
[place] LEVEL OF CARE SCREENING TOOL     IDENTIFICATION  Patient Name: [name] Birthdate: 1940-09-14 Sex: female Admission Date (Current Location): 02/28/2021  [facility] and [place] Number:  Herbalist and Address:  [facility] [place], [place] [address] [place],  [address]      Provider Number: [id number]  Attending Physician Name and Address:  Mercy Riding, MD  Relative Name and Phone Number:  Jones,Cathy Daughter   [phone]    Current Level of Care: Hospital Recommended Level of Care: [place] Prior Approval Number:    Date Approved/Denied:   PASRR Number:    Discharge Plan: SNF    Current Diagnoses: Patient Active Problem List   Diagnosis Date Noted   Sinus tachycardia 03/11/2021   Elevated troponin 03/08/2021   Stroke-like symptoms 03/08/2021   Anemia of chronic disease 03/08/2021   Influenza A 02/28/2021   Bilateral lower extremity edema 02/21/2021   Acute supraglottitis with epiglottitis 02/05/2021   Epiglottitis 02/05/2021   Reactive airway disease 01/20/2021   Diabetic peripheral neuropathy ([place]) 12/19/2020   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   Primary insomnia 11/02/2020   Irritation of eyelid 07/26/2020   S/P aortic valve replacement with bioprosthetic valve 05/04/2020   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   PAD with bilateral claudication    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[place]) 04/07/2019   Low back pain 02/03/2019   Scoliosis deformity of spine 02/03/2019   Osteoarthritis of hip 11/17/2018   Claudication ([place])    Chronic headaches 11/12/2017   Degeneration of lumbar intervertebral disc 06/13/2017   Hyperkalemia 06/03/2017   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[place])    Mass of left adrenal gland ([place]) 04/19/2017   Spinal stenosis of lumbar region 10/20/2014   Hyperlipidemia 03/16/2013   Dense breasts 12/22/2012   Anxiety state 04/16/2012    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/16/2012   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   Osteoarthritis of knee 04/16/2012   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 female breast ([place]) 12/20/2010   History of breast cancer 12/05/2010   Depression 09/14/2008   GERD 09/14/2008   Type 2 diabetes mellitus with diabetic neuropathy, without long-term current use of insulin ([place]) 05/20/2008   Essential hypertension 04/17/2007   Coronary atherosclerosis 04/17/2007   Hypothyroidism 04/03/2007    Orientation RESPIRATION BLADDER Height & Weight     Self, Time, Situation, Place  Normal Continent Weight: 147 lb 0.8 oz (66.7 kg) Height:  5\' 3"  (160 cm)  BEHAVIORAL SYMPTOMS/MOOD NEUROLOGICAL BOWEL NUTRITION STATUS      Continent Diet (see discharge summary)  AMBULATORY STATUS COMMUNICATION OF NEEDS Skin   Limited Assist Verbally Surgical wounds                       Personal Care Assistance Level of Assistance  Bathing, Feeding, Dressing Bathing Assistance: Limited assistance Feeding assistance: Independent Dressing Assistance: Limited assistance     Functional Limitations Info  Sight, Hearing, Speech Sight Info: Adequate Hearing Info: Adequate Speech Info: Adequate    SPECIAL CARE FACTORS FREQUENCY  PT (By licensed PT), OT (By licensed OT)     PT Frequency: 5x week OT Frequency: 5x week            Contractures Contractures Info: Not present    Additional Factors Info  Code Status, Allergies, Insulin Sliding Scale Code Status Info: DNR Allergies Info:  Codeine, Antihistamines, Diphenhydramine-type, Gabapentin, Statins, Sulfa Antibiotics, Erythromycin, Trulicity (Dulaglutide)   Insulin Sliding Scale Info: see discharge summary       Current Medications (03/12/2021):  This is the current hospital active medication list Current Facility-Administered Medications  Medication Dose Route Frequency Provider Last Rate Last Admin   0.9 %  sodium chloride  infusion  250 mL Intravenous PRN Laurence Slate M, PA-C       0.9 %  sodium chloride infusion  250 mL Intravenous PRN Laurence Slate M, PA-C       acetaminophen (TYLENOL) tablet 325-650 mg  325-650 mg Oral Q6H PRN Laurence Slate M, PA-C       alum & mag hydroxide-simeth (MAALOX/MYLANTA) 200-200-20 MG/5ML suspension 15-30 mL  15-30 mL Oral Q2H PRN Laurence Slate M, PA-C       aspirin EC tablet 81 mg  81 mg Oral Daily Laurence Slate M, PA-C   81 mg at 03/12/21 0962   bisacodyl (DULCOLAX) suppository 10 mg  10 mg Rectal Daily PRN Mercy Riding, MD       calcium carbonate (TUMS - dosed in mg elemental calcium) chewable tablet 200 mg of elemental calcium  1 tablet Oral TID PRN Ulyses Amor, PA-C   200 mg of elemental calcium at 03/05/21 2055   docusate sodium (COLACE) capsule 100 mg  100 mg Oral BID Laurence Slate M, PA-C   100 mg at 03/12/21 8366   feeding supplement (ENSURE ENLIVE / ENSURE PLUS) liquid 237 mL  237 mL Oral BID BM Ulyses Amor, PA-C   237 mL at 03/12/21 2947   guaiFENesin-dextromethorphan (ROBITUSSIN DM) 100-10 MG/5ML syrup 15 mL  15 mL Oral Q4H PRN Laurence Slate M, PA-C       heparin injection 5,000 Units  5,000 Units Subcutaneous Q8H Laurence Slate M, PA-C   5,000 Units at 03/12/21 0547   HYDROcodone-acetaminophen (NORCO) 7.5-325 MG per tablet 1-2 tablet  1-2 tablet Oral Q4H PRN Ulyses Amor, PA-C   1 tablet at 03/12/21 6546   insulin aspart (novoLOG) injection 0-15 Units  0-15 Units Subcutaneous TID WC Ulyses Amor, PA-C   3 Units at 03/12/21 5035   insulin aspart (novoLOG) injection 0-5 Units  0-5 Units Subcutaneous QHS Ulyses Amor, PA-C   2 Units at 03/11/21 2135   insulin aspart (novoLOG) injection 8 Units  8 Units Subcutaneous TID WC Wendee Beavers T, MD   8 Units at 03/12/21 0616   insulin glargine-yfgn (SEMGLEE) injection 18 Units  18 Units Subcutaneous BID Wendee Beavers T, MD   18 Units at 03/12/21 0940   labetalol (NORMODYNE) injection 10 mg  10 mg Intravenous Q2H PRN  Wendee Beavers T, MD   10 mg at 03/12/21 0131   levothyroxine (SYNTHROID) tablet 100 mcg  100 mcg Oral Q0600 Ulyses Amor, PA-C   100 mcg at 03/12/21 0547   magnesium sulfate IVPB 2 g 50 mL  2 g Intravenous Daily PRN Laurence Slate M, PA-C       metoprolol tartrate (LOPRESSOR) tablet 37.5 mg  37.5 mg Oral BID Wendee Beavers T, MD   37.5 mg at 03/12/21 0936   morphine (PF) 2 MG/ML injection 0.5-1 mg  0.5-1 mg Intravenous Q2H PRN Laurence Slate M, PA-C   1 mg at 03/06/21 1811   multivitamin with minerals tablet 1 tablet  1 tablet Oral Daily Ulyses Amor, PA-C   1 tablet at 03/12/21 0936   ondansetron (ZOFRAN) injection 4 mg  4  mg Intravenous Q6H PRN Ulyses Amor, PA-C   4 mg at 03/08/21 0950   pantoprazole (PROTONIX) EC tablet 40 mg  40 mg Oral Daily Laurence Slate M, PA-C   40 mg at 03/12/21 9563   PARoxetine (PAXIL) tablet 20 mg  20 mg Oral q AM Rhyne, Samantha J, PA-C   20 mg at 03/12/21 0936   phenol (CHLORASEPTIC) mouth spray 1 spray  1 spray Mouth/Throat PRN Laurence Slate M, PA-C       polyethylene glycol (MIRALAX / GLYCOLAX) packet 17 g  17 g Oral BID PRN Wendee Beavers T, MD   17 g at 03/09/21 8756   potassium chloride SA (KLOR-CON M) CR tablet 20-40 mEq  20-40 mEq Oral Daily PRN Laurence Slate M, PA-C       senna-docusate (Senokot-S) tablet 1 tablet  1 tablet Oral BID PRN Mercy Riding, MD   1 tablet at 03/09/21 0920   sodium chloride flush (NS) 0.9 % injection 3 mL  3 mL Intravenous Q12H Laurence Slate M, PA-C   3 mL at 03/12/21 4332   sodium chloride flush (NS) 0.9 % injection 3 mL  3 mL Intravenous PRN Laurence Slate M, PA-C       sodium chloride flush (NS) 0.9 % injection 3 mL  3 mL Intravenous Q12H Laurence Slate M, PA-C   3 mL at 03/12/21 9518   sodium chloride flush (NS) 0.9 % injection 3 mL  3 mL Intravenous PRN Laurence Slate M, PA-C       traMADol Veatrice Bourbon) tablet 50 mg  50 mg Oral Q12H Laurence Slate M, PA-C   50 mg at 03/12/21 8416     Discharge Medications: Please see discharge  summary for a list of discharge medications.  Relevant Imaging Results:  Relevant Lab Results:   Additional Information SSN: [id number].  Pt is vaccinated for covid with 2 boosters  Zadin Lange, Veronia Beets, LCSW

## 2021-03-12 NOTE — TOC Initial Note (Signed)
Transition of Care [facility]) - Initial/Assessment Note    Patient Details  Name: [name] MRN: [id number] Date of Birth: 04-Jul-1940  Transition of Care [facility]) CM/SW Contact:    Joanne Chars, LCSW Phone Number: 03/12/2021, 10:24 AM  Clinical Narrative:      CSW confirmed with pt that she is still awaiting insurance appeal and hopeful of being approved for CIR.  Discussed SNF as back up plan and pt is agreeable to this.  Permission given to speak with daughter [name], husband [name].  Pt called daughter [name] and CSW explained SNF back up plan, she is agreeable as well.  Choice document given, permission given to send out referral in hub.  Pt is vaccinated for covid with 2 boosters.    Referral sent out in hub. PASSR went to level 2, awaiting MD cosigns to upload docs to [facility] Must.              Expected Discharge Plan: [place] Barriers to Discharge: Ship broker, Continued Medical Work up, SNF Pending bed offer   Patient Goals and CMS Choice Patient states their goals for this hospitalization and ongoing recovery are:: get back to normal activities CMS Medicare.gov Compare Post Acute Care list provided to:: Patient Choice offered to / list presented to : Patient  Expected Discharge Plan and Services Expected Discharge Plan: [place] In-house Referral: Clinical Social Work Discharge Planning Services: CM Consult Post Acute Care Choice: IP Rehab Living arrangements for the past 2 months: Single Family Home                                      Prior Living Arrangements/Services Living arrangements for the past 2 months: Single Family Home Lives with:: Spouse Patient language and need for interpreter reviewed:: Yes Do you feel safe going back to the place where you live?: Yes      Need for Family Participation in Patient Care: Yes (Comment) Care giver support system in place?: Yes (comment) Current home services: Other (comment)  (none) Criminal Activity/Legal Involvement Pertinent to Current Situation/Hospitalization: No - Comment as needed  Activities of Daily Living Home Assistive Devices/Equipment: None ADL Screening (condition at time of admission) Patient's cognitive ability adequate to safely complete daily activities?: No Is the patient deaf or have difficulty hearing?: No Does the patient have difficulty seeing, even when wearing glasses/contacts?: No Does the patient have difficulty concentrating, remembering, or making decisions?: No Patient able to express need for assistance with ADLs?: Yes Does the patient have difficulty dressing or bathing?: No Independently performs ADLs?: Yes (appropriate for developmental age) Does the patient have difficulty walking or climbing stairs?: No Weakness of Legs: Both Weakness of Arms/Hands: None  Permission Sought/Granted Permission sought to share information with : Family Supports Permission granted to share information with : Yes, Verbal Permission Granted  Share Information with NAME: daughter [name], husband [name]  Permission granted to share info w AGENCY: SNF        Emotional Assessment Appearance:: Appears stated age Attitude/Demeanor/Rapport: Engaged Affect (typically observed): Appropriate, Pleasant Orientation: : Oriented to Self, Oriented to Place, Oriented to  Time, Oriented to Situation Alcohol / Substance Use: Not Applicable Psych Involvement: No (comment)  Admission diagnosis:  Diplopia [H53.2] Influenza A [J10.1] Hyperglycemia [R73.9] Leg weakness [R29.898] NSTEMI (non-ST elevated myocardial infarction) ([place]) [I21.4] AKI (acute kidney injury) ([place]) [N17.9] Patient Active Problem List   Diagnosis  Date Noted   Sinus tachycardia 03/11/2021   Elevated troponin 03/08/2021   Stroke-like symptoms 03/08/2021   Anemia of chronic disease 03/08/2021   Influenza A 02/28/2021   Bilateral lower extremity edema 02/21/2021   Acute supraglottitis  with epiglottitis 02/05/2021   Epiglottitis 02/05/2021   Reactive airway disease 01/20/2021   Diabetic peripheral neuropathy ([place]) 12/19/2020   Inflammatory polyarthropathy ([place]) 12/19/2020   Primary insomnia 11/02/2020   Irritation of eyelid 07/26/2020   S/P aortic valve replacement with bioprosthetic valve 05/04/2020   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   PAD with bilateral claudication    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[place]) 04/07/2019   Low back pain 02/03/2019   Scoliosis deformity of spine 02/03/2019   Osteoarthritis of hip 11/17/2018   Claudication ([place])    Chronic headaches 11/12/2017   Degeneration of lumbar intervertebral disc 06/13/2017   Hyperkalemia 06/03/2017   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[place])    Mass of left adrenal gland ([place]) 04/19/2017   Spinal stenosis of lumbar region 10/20/2014   Hyperlipidemia 03/16/2013   Dense breasts 12/22/2012   Anxiety state 04/16/2012   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/16/2012   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   Osteoarthritis of knee 04/16/2012   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 female breast ([place]) 12/20/2010   History of breast cancer 12/05/2010   Depression 09/14/2008   GERD 09/14/2008   Type 2 diabetes mellitus with diabetic neuropathy, without long-term current use of insulin ([place]) 05/20/2008   Essential hypertension 04/17/2007   Coronary atherosclerosis 04/17/2007   Hypothyroidism 04/03/2007   PCP:  [name], MD Pharmacy:   [facility] [phone] [address], [address] AT [facility] [address] [place] [address] Phone: [phone] Fax: [phone]     Social Determinants of Health (SDOH) Interventions    Readmission Risk Interventions Readmission Risk Prevention Plan 02/07/2021  Transportation Screening Complete  PCP or Specialist Appt within 3-5 Days Complete  HRI or Home Care Consult Complete   Social Work Consult for [place] Planning/Counseling Complete  Palliative Care Screening Not Applicable  Medication Review Press photographer) Complete  Some recent data might be hidden

## 2021-03-13 ENCOUNTER — Other Ambulatory Visit: Payer: Self-pay

## 2021-03-13 ENCOUNTER — Inpatient Hospital Stay (HOSPITAL_COMMUNITY)
Admission: RE | Admit: 2021-03-13 | Discharge: 2021-03-18 | DRG: 560 | Disposition: A | Discharge status: Home Health | Payer: Medicare Other | Source: Intra-hospital | Attending: Physical Medicine & Rehabilitation | Admitting: Physical Medicine & Rehabilitation

## 2021-03-13 ENCOUNTER — Encounter (HOSPITAL_COMMUNITY): Payer: Self-pay | Admitting: Physical Medicine & Rehabilitation

## 2021-03-13 DIAGNOSIS — Z66 Do not resuscitate: Secondary | ICD-10-CM | POA: Diagnosis present

## 2021-03-13 DIAGNOSIS — S78112A Complete traumatic amputation at level between left hip and knee, initial encounter: Secondary | ICD-10-CM | POA: Diagnosis not present

## 2021-03-13 DIAGNOSIS — Z951 Presence of aortocoronary bypass graft: Secondary | ICD-10-CM | POA: Diagnosis not present

## 2021-03-13 DIAGNOSIS — I10 Essential (primary) hypertension: Secondary | ICD-10-CM | POA: Diagnosis not present

## 2021-03-13 DIAGNOSIS — M069 Rheumatoid arthritis, unspecified: Secondary | ICD-10-CM | POA: Diagnosis present

## 2021-03-13 DIAGNOSIS — E1140 Type 2 diabetes mellitus with diabetic neuropathy, unspecified: Secondary | ICD-10-CM | POA: Diagnosis present

## 2021-03-13 DIAGNOSIS — Z4781 Encounter for orthopedic aftercare following surgical amputation: Secondary | ICD-10-CM | POA: Diagnosis present

## 2021-03-13 DIAGNOSIS — G546 Phantom limb syndrome with pain: Secondary | ICD-10-CM | POA: Diagnosis present

## 2021-03-13 DIAGNOSIS — Z7985 Long-term (current) use of injectable non-insulin antidiabetic drugs: Secondary | ICD-10-CM

## 2021-03-13 DIAGNOSIS — I2510 Atherosclerotic heart disease of native coronary artery without angina pectoris: Secondary | ICD-10-CM | POA: Diagnosis present

## 2021-03-13 DIAGNOSIS — E1142 Type 2 diabetes mellitus with diabetic polyneuropathy: Secondary | ICD-10-CM | POA: Diagnosis present

## 2021-03-13 DIAGNOSIS — E1122 Type 2 diabetes mellitus with diabetic chronic kidney disease: Secondary | ICD-10-CM | POA: Diagnosis present

## 2021-03-13 DIAGNOSIS — Z807 Family history of other malignant neoplasms of lymphoid, hematopoietic and related tissues: Secondary | ICD-10-CM

## 2021-03-13 DIAGNOSIS — Z8616 Personal history of COVID-19: Secondary | ICD-10-CM | POA: Diagnosis not present

## 2021-03-13 DIAGNOSIS — Z79899 Other long term (current) drug therapy: Secondary | ICD-10-CM | POA: Diagnosis not present

## 2021-03-13 DIAGNOSIS — F32A Depression, unspecified: Secondary | ICD-10-CM | POA: Diagnosis present

## 2021-03-13 DIAGNOSIS — Z7982 Long term (current) use of aspirin: Secondary | ICD-10-CM

## 2021-03-13 DIAGNOSIS — Z841 Family history of disorders of kidney and ureter: Secondary | ICD-10-CM

## 2021-03-13 DIAGNOSIS — Z794 Long term (current) use of insulin: Secondary | ICD-10-CM | POA: Diagnosis not present

## 2021-03-13 DIAGNOSIS — N184 Chronic kidney disease, stage 4 (severe): Secondary | ICD-10-CM | POA: Diagnosis present

## 2021-03-13 DIAGNOSIS — K5900 Constipation, unspecified: Secondary | ICD-10-CM | POA: Diagnosis present

## 2021-03-13 DIAGNOSIS — Z853 Personal history of malignant neoplasm of breast: Secondary | ICD-10-CM | POA: Diagnosis not present

## 2021-03-13 DIAGNOSIS — I129 Hypertensive chronic kidney disease with stage 1 through stage 4 chronic kidney disease, or unspecified chronic kidney disease: Secondary | ICD-10-CM | POA: Diagnosis present

## 2021-03-13 DIAGNOSIS — Z808 Family history of malignant neoplasm of other organs or systems: Secondary | ICD-10-CM

## 2021-03-13 DIAGNOSIS — Z833 Family history of diabetes mellitus: Secondary | ICD-10-CM

## 2021-03-13 DIAGNOSIS — Z79890 Hormone replacement therapy (postmenopausal): Secondary | ICD-10-CM

## 2021-03-13 DIAGNOSIS — Z7984 Long term (current) use of oral hypoglycemic drugs: Secondary | ICD-10-CM | POA: Diagnosis not present

## 2021-03-13 DIAGNOSIS — Z87891 Personal history of nicotine dependence: Secondary | ICD-10-CM

## 2021-03-13 DIAGNOSIS — Z89612 Acquired absence of left leg above knee: Secondary | ICD-10-CM

## 2021-03-13 DIAGNOSIS — Z8249 Family history of ischemic heart disease and other diseases of the circulatory system: Secondary | ICD-10-CM

## 2021-03-13 DIAGNOSIS — G4700 Insomnia, unspecified: Secondary | ICD-10-CM | POA: Diagnosis present

## 2021-03-13 DIAGNOSIS — R000 Tachycardia, unspecified: Secondary | ICD-10-CM | POA: Diagnosis present

## 2021-03-13 LAB — GLUCOSE, CAPILLARY
Glucose-Capillary: 180 mg/dL — ABNORMAL HIGH (ref 70–99)
Glucose-Capillary: 333 mg/dL — ABNORMAL HIGH (ref 70–99)
Glucose-Capillary: 92 mg/dL (ref 70–99)
Glucose-Capillary: 110 mg/dL — ABNORMAL HIGH (ref 70–99)

## 2021-03-13 MED ORDER — METOPROLOL TARTRATE 50 MG PO TABS: 50.0000 mg | ORAL_TABLET | Freq: Two times a day (BID) | ORAL | Status: DC

## 2021-03-13 MED ORDER — INSULIN ASPART 100 UNIT/ML IJ SOLN
8.0000 [IU] | Freq: Three times a day (TID) | INTRAMUSCULAR | Status: DC
  Administered 2021-03-13 – 2021-03-16 (×6): 8 [IU] via SUBCUTANEOUS

## 2021-03-13 MED ORDER — INSULIN ASPART 100 UNIT/ML IJ SOLN: 0.0000 [IU] | Freq: Three times a day (TID) | INTRAMUSCULAR | 11 refills | Status: DC

## 2021-03-13 MED ORDER — TRAMADOL HCL 50 MG PO TABS
50.0000 mg | ORAL_TABLET | Freq: Two times a day (BID) | ORAL | Status: DC
  Administered 2021-03-13 – 2021-03-18 (×10): 50 mg via ORAL
  Filled 2021-03-13 (×10): qty 1

## 2021-03-13 MED ORDER — GLIPIZIDE ER 10 MG PO TB24
10.0000 mg | ORAL_TABLET | Freq: Every day | ORAL | Status: DC
  Administered 2021-03-14 – 2021-03-18 (×5): 10 mg via ORAL
  Filled 2021-03-13 (×6): qty 1

## 2021-03-13 MED ORDER — ENSURE ENLIVE PO LIQD: 237.0000 mL | Freq: Two times a day (BID) | ORAL | 12 refills | Status: DC

## 2021-03-13 MED ORDER — HYDROCODONE-ACETAMINOPHEN 7.5-325 MG PO TABS
1.0000 | ORAL_TABLET | ORAL | Status: DC | PRN
  Filled 2021-03-13: qty 2

## 2021-03-13 MED ORDER — ASPIRIN EC 81 MG PO TBEC
81.0000 mg | DELAYED_RELEASE_TABLET | Freq: Every day | ORAL | Status: DC
  Administered 2021-03-14 – 2021-03-18 (×5): 81 mg via ORAL
  Filled 2021-03-13 (×5): qty 1

## 2021-03-13 MED ORDER — DOCUSATE SODIUM 100 MG PO CAPS
100.0000 mg | ORAL_CAPSULE | Freq: Two times a day (BID) | ORAL | Status: DC
  Administered 2021-03-13 – 2021-03-18 (×10): 100 mg via ORAL
  Filled 2021-03-13 (×10): qty 1

## 2021-03-13 MED ORDER — ZINC SULFATE 220 (50 ZN) MG PO CAPS
220.0000 mg | ORAL_CAPSULE | Freq: Every day | ORAL | Status: DC
  Administered 2021-03-13 – 2021-03-18 (×6): 220 mg via ORAL
  Filled 2021-03-13 (×6): qty 1

## 2021-03-13 MED ORDER — BISACODYL 10 MG RE SUPP: 10.0000 mg | Freq: Every day | RECTAL | Status: DC | PRN

## 2021-03-13 MED ORDER — PROCHLORPERAZINE 25 MG RE SUPP: 12.5000 mg | Freq: Four times a day (QID) | RECTAL | Status: DC | PRN

## 2021-03-13 MED ORDER — ADULT MULTIVITAMIN W/MINERALS CH
1.0000 | ORAL_TABLET | Freq: Every day | ORAL | Status: DC
Start: 2021-03-14 — End: 2021-08-04

## 2021-03-13 MED ORDER — HEPARIN SODIUM (PORCINE) 5000 UNIT/ML IJ SOLN: 5000.0000 [IU] | Freq: Three times a day (TID) | INTRAMUSCULAR | Status: DC

## 2021-03-13 MED ORDER — HYDROCODONE-ACETAMINOPHEN 7.5-325 MG PO TABS
1.0000 | ORAL_TABLET | Freq: Four times a day (QID) | ORAL | 0 refills | Status: DC | PRN
Start: 2021-03-13 — End: 2021-03-17

## 2021-03-13 MED ORDER — PROCHLORPERAZINE MALEATE 5 MG PO TABS: 5.0000 mg | ORAL_TABLET | Freq: Four times a day (QID) | ORAL | Status: DC | PRN

## 2021-03-13 MED ORDER — JUVEN PO PACK
1.0000 | PACK | Freq: Two times a day (BID) | ORAL | Status: DC
Start: 2021-03-13 — End: 2021-03-18
  Administered 2021-03-13: 1 via ORAL
  Filled 2021-03-13 (×4): qty 1

## 2021-03-13 MED ORDER — LIVING WELL WITH DIABETES BOOK
Freq: Once | Status: AC
  Filled 2021-03-13: qty 1

## 2021-03-13 MED ORDER — ENOXAPARIN SODIUM 40 MG/0.4ML IJ SOSY
40.0000 mg | PREFILLED_SYRINGE | INTRAMUSCULAR | Status: DC
  Administered 2021-03-13 – 2021-03-17 (×5): 40 mg via SUBCUTANEOUS
  Filled 2021-03-13 (×5): qty 0.4

## 2021-03-13 MED ORDER — PROCHLORPERAZINE EDISYLATE 10 MG/2ML IJ SOLN: 5.0000 mg | Freq: Four times a day (QID) | INTRAMUSCULAR | Status: DC | PRN

## 2021-03-13 MED ORDER — ACETAMINOPHEN 325 MG PO TABS: 325.0000 mg | ORAL_TABLET | ORAL | Status: DC | PRN

## 2021-03-13 MED ORDER — TRAZODONE HCL 50 MG PO TABS: 25.0000 mg | ORAL_TABLET | Freq: Every evening | ORAL | Status: DC | PRN

## 2021-03-13 MED ORDER — INSULIN ASPART 100 UNIT/ML IJ SOLN
0.0000 [IU] | Freq: Every day | INTRAMUSCULAR | Status: DC
  Administered 2021-03-15: 2 [IU] via SUBCUTANEOUS

## 2021-03-13 MED ORDER — PAROXETINE HCL 20 MG PO TABS
20.0000 mg | ORAL_TABLET | Freq: Every morning | ORAL | Status: DC
  Administered 2021-03-14 – 2021-03-18 (×5): 20 mg via ORAL
  Filled 2021-03-13 (×5): qty 1

## 2021-03-13 MED ORDER — FLEET ENEMA 7-19 GM/118ML RE ENEM: 1.0000 | ENEMA | Freq: Once | RECTAL | Status: DC | PRN

## 2021-03-13 MED ORDER — ENOXAPARIN SODIUM 40 MG/0.4ML IJ SOSY: 40.0000 mg | PREFILLED_SYRINGE | INTRAMUSCULAR | Status: DC

## 2021-03-13 MED ORDER — PANTOPRAZOLE SODIUM 40 MG PO TBEC
40.0000 mg | DELAYED_RELEASE_TABLET | Freq: Every day | ORAL | Status: DC
  Administered 2021-03-14 – 2021-03-18 (×5): 40 mg via ORAL
  Filled 2021-03-13 (×5): qty 1

## 2021-03-13 MED ORDER — PHENOL 1.4 % MT LIQD
1.0000 | OROMUCOSAL | Status: DC | PRN
  Filled 2021-03-13: qty 177

## 2021-03-13 MED ORDER — INSULIN GLARGINE 100 UNIT/ML SOLOSTAR PEN: 15.0000 [IU] | PEN_INJECTOR | Freq: Every day | SUBCUTANEOUS | 1 refills | Status: DC

## 2021-03-13 MED ORDER — ADULT MULTIVITAMIN W/MINERALS CH
1.0000 | ORAL_TABLET | Freq: Every day | ORAL | Status: DC
  Administered 2021-03-14 – 2021-03-18 (×5): 1 via ORAL
  Filled 2021-03-13 (×5): qty 1

## 2021-03-13 MED ORDER — HYDROCODONE-ACETAMINOPHEN 10-325 MG PO TABS
1.0000 | ORAL_TABLET | ORAL | Status: DC | PRN
  Administered 2021-03-13: 2 via ORAL
  Administered 2021-03-13 – 2021-03-18 (×9): 1 via ORAL
  Filled 2021-03-13 (×8): qty 1
  Filled 2021-03-13: qty 2
  Filled 2021-03-13: qty 1

## 2021-03-13 MED ORDER — ALUM & MAG HYDROXIDE-SIMETH 200-200-20 MG/5ML PO SUSP: 30.0000 mL | ORAL | Status: DC | PRN

## 2021-03-13 MED ORDER — INSULIN GLARGINE-YFGN 100 UNIT/ML ~~LOC~~ SOLN
26.0000 [IU] | Freq: Two times a day (BID) | SUBCUTANEOUS | Status: DC
  Administered 2021-03-13: 26 [IU] via SUBCUTANEOUS
  Filled 2021-03-13 (×2): qty 0.26

## 2021-03-13 MED ORDER — INSULIN ASPART 100 UNIT/ML IJ SOLN
0.0000 [IU] | Freq: Three times a day (TID) | INTRAMUSCULAR | Status: DC
  Administered 2021-03-14: 1 [IU] via SUBCUTANEOUS
  Administered 2021-03-14 – 2021-03-16 (×2): 2 [IU] via SUBCUTANEOUS
  Administered 2021-03-17 (×2): 1 [IU] via SUBCUTANEOUS

## 2021-03-13 MED ORDER — CALCIUM CARBONATE ANTACID 500 MG PO CHEW: 1.0000 | CHEWABLE_TABLET | Freq: Three times a day (TID) | ORAL | Status: DC | PRN

## 2021-03-13 MED ORDER — LOSARTAN POTASSIUM 50 MG PO TABS
50.0000 mg | ORAL_TABLET | Freq: Every day | ORAL | Status: DC
Start: 2021-03-16 — End: 2021-03-18

## 2021-03-13 MED ORDER — GUAIFENESIN-DM 100-10 MG/5ML PO SYRP: 5.0000 mL | ORAL_SOLUTION | Freq: Four times a day (QID) | ORAL | Status: DC | PRN

## 2021-03-13 MED ORDER — SENNOSIDES-DOCUSATE SODIUM 8.6-50 MG PO TABS: 1.0000 | ORAL_TABLET | Freq: Two times a day (BID) | ORAL | Status: DC | PRN

## 2021-03-13 MED ORDER — INSULIN GLARGINE 100 UNIT/ML SOLOSTAR PEN: 20.0000 [IU] | PEN_INJECTOR | Freq: Every day | SUBCUTANEOUS | 1 refills | Status: DC

## 2021-03-13 MED ORDER — ENOXAPARIN SODIUM 30 MG/0.3ML IJ SOSY: 30.0000 mg | PREFILLED_SYRINGE | INTRAMUSCULAR | Status: DC

## 2021-03-13 MED ORDER — INSULIN GLARGINE-YFGN 100 UNIT/ML ~~LOC~~ SOLN
26.0000 [IU] | Freq: Two times a day (BID) | SUBCUTANEOUS | Status: DC
  Administered 2021-03-13 – 2021-03-16 (×6): 26 [IU] via SUBCUTANEOUS
  Filled 2021-03-13 (×7): qty 0.26

## 2021-03-13 MED ORDER — INSULIN ASPART 100 UNIT/ML IJ SOLN: 0.0000 [IU] | Freq: Three times a day (TID) | INTRAMUSCULAR | Status: DC

## 2021-03-13 MED ORDER — POLYETHYLENE GLYCOL 3350 17 G PO PACK: 17.0000 g | PACK | Freq: Two times a day (BID) | ORAL | Status: DC | PRN

## 2021-03-13 MED ORDER — LEVOTHYROXINE SODIUM 100 MCG PO TABS
100.0000 ug | ORAL_TABLET | Freq: Every day | ORAL | Status: DC
  Administered 2021-03-14 – 2021-03-18 (×5): 100 ug via ORAL
  Filled 2021-03-13 (×5): qty 1

## 2021-03-13 MED ORDER — POLYETHYLENE GLYCOL 3350 17 G PO PACK: 17.0000 g | PACK | Freq: Every day | ORAL | Status: DC | PRN

## 2021-03-13 MED ORDER — METOPROLOL TARTRATE 25 MG PO TABS
37.5000 mg | ORAL_TABLET | Freq: Two times a day (BID) | ORAL | Status: DC
  Administered 2021-03-13 – 2021-03-15 (×4): 37.5 mg via ORAL
  Filled 2021-03-13 (×4): qty 1

## 2021-03-13 MED ORDER — SEMAGLUTIDE(0.25 OR 0.5MG/DOS) 2 MG/1.5ML ~~LOC~~ SOPN
0.5000 mg | PEN_INJECTOR | SUBCUTANEOUS | Status: DC
  Administered 2021-03-13: 0.5 mg via SUBCUTANEOUS

## 2021-03-13 MED ORDER — ASCORBIC ACID 500 MG PO TABS
250.0000 mg | ORAL_TABLET | Freq: Two times a day (BID) | ORAL | Status: DC
  Administered 2021-03-13 – 2021-03-18 (×10): 250 mg via ORAL
  Filled 2021-03-13 (×10): qty 1

## 2021-03-13 NOTE — TOC Transition Note (Signed)
Transition of Care (TOC) - CM/SW Discharge Note Marvetta Gibbons RN, BSN Transitions of Care Unit 4E- RN Case Manager See Treatment Team for direct phone #    Patient Details  Name: [name] MRN: [id number] Date of Birth: Jul 27, 1940  Transition of Care [facility]) CM/SW Contact:  [name], RN Phone Number: 03/13/2021, 10:32 AM   Clinical Narrative:    Notified this am that insurance has approved CIR on appeal. [place] with [facility] has notified TOC that pt will have bed available today for transition to [facility] re[facility]. MD has cleared patient to discharge to [facility] rehab later today. D/C order has been placed.    Final next level of care: IP Rehab Facility Barriers to Discharge: Barriers Resolved   Patient Goals and CMS Choice Patient states their goals for this hospitalization and ongoing recovery are:: get back to normal activities CMS Medicare.gov Compare Post Acute Care list provided to:: Patient Choice offered to / list presented to : Patient  Discharge Placement                 [facility]      Discharge Plan and Services In-house Referral: Clinical Social Work Discharge Planning Services: CM Consult Post Acute Care Choice: IP Rehab                               Social Determinants of Health (SDOH) Interventions     Readmission Risk Interventions Readmission Risk Prevention Plan 03/13/2021 02/07/2021  Transportation Screening Complete Complete  PCP or Specialist Appt within 3-5 Days - Complete  HRI or Home - Complete  Social Work Consult for [place] Planning/Counseling - Complete  Palliative Care Screening - Not Applicable  Medication Review Press photographer) Complete Complete  PCP or Specialist appointment within 3-5 days of discharge Not Complete -  PCP/Specialist Appt Not Complete comments going to [place] rehab -  [place] or Home Care Consult Complete -  Some recent data might be hidden

## 2021-03-13 NOTE — Care Management Important Message (Signed)
Important Message  Patient Details  Name: LEZLEE GILLS MRN: [id number] Date of Birth: Feb 27, 1940   Medicare Important Message Given:  Yes     Shelda Altes 03/13/2021, 8:45 AM

## 2021-03-13 NOTE — Progress Notes (Signed)
Inpatient Rehab Admissions Coordinator:    I have insurance approval and a bed available for pt to admit to CIR today. Dr. Cyndia Skeeters in agreement.  Will let pt/family and TOC team know.   Shann Medal, PT, DPT Admissions Coordinator [phone] 03/13/21  12:05 PM

## 2021-03-13 NOTE — Discharge Summary (Signed)
Physician Discharge Summary   Patient: [name] MRN: [id number] DOB: 08/03/1940  Admit date:     02/28/2021  Discharge date: 03/13/21  Discharge Physician: Mercy Riding   PCP: [name], MD   Recommendations at discharge:   Closely monitor blood glucose and adjust insulin as appropriate. Outpatient follow-up with vascular surgery as below Recheck basic labs including BMP, CBC and magnesium in about a week  Discharge Diagnoses: Principal Problem:   PAD with bilateral claudication Active Problems:   Elevated troponin   Type 2 diabetes mellitus with diabetic neuropathy, without long-term current use of insulin (HCC)   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HCC)   Stroke-like symptoms   Anemia of chronic disease   Sinus tachycardia   Hypothyroidism   Essential hypertension   Coronary atherosclerosis   GERD   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 female breast ([place])   Hyperlipidemia   Diabetic peripheral neuropathy (HCC)   Bilateral lower extremity edema   Hyponatremia  Resolved Problems:   Influenza A   Troponin level elevated   Diplopia   Leg weakness   Hospital Course: 81 y.o. female with medical history significant of DM2, HTN HLD, CAD sp CABG recent Covid.  Admitted for elevated troponin and leg weakness.  Found to have worsening ABIs and non-healing foot wounds, elevated troponin and influenza A.  Elevated troponin treated medically per recommendation by cardiology.  She did not require treatment for influenza A.  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  Hospital course complicated by strokelike symptoms such as diplopia, LLE weakness and clumsiness but the stroke work-up unrevealing. MRI thoracic and lumbar spine with some moderate to severe lumbar canal and foraminal stenosis.  Patient had patient's diplopia resolved.  No bowel or bladder habit change.  She is discharged to [facility] as recommended by therapy.  See  individual problem list below for more hospital course.  Assessment and Plan: * PAD with bilateral claudication- (present on admission) S/p angiogram and left AKA on 2/13.  She could have claudication from spinal stenosis as well. -Continue aspirin.  On Repatha due to statin intolerance -Continue therapy at [facility]. -Tylenol, Lyrica and Norco as needed for pain -Bowel regimen.  Elevated troponin- (present on admission) No chest pain.  No EKG changes to suggest acute ischemia.  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  Received heparin drip for 48 hours. -Cardiology recommended medical management and signed off. -On aspirin, Repatha and beta-blocker  Sinus tachycardia Improved. -Increased metoprolol to 50 mg twice daily  Anemia of chronic disease Recent Labs    03/03/21 1739 03/04/21 0134 03/05/21 0150 03/06/21 0140 03/07/21 0137 03/08/21 0214 03/09/21 0255 03/10/21 0216 03/11/21 0152 03/12/21 0132  HGB 12.1 13.1 10.6* 10.7* 10.2* 9.9* 11.6* 10.3* 9.2* 10.0*  H&H relatively stable. -Recheck CBC in 1 week   Stroke-like symptoms- (present on admission) Patient had diplopia, LUE weakness and "clumsiness".  Had prior posterior decompression at L3-4 through L5-S1.  MRI brain without acute finding.  MRI thoracic and lumbar spine with some moderate to severe lumbar canal and foraminal stenosis.  Patient had patient's diplopia resolved.  No bowel or bladder habit change. -Outpatient follow-up with neurosurgery/orthopedic surgery  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[place])- (present on admission) Recent Labs    03/01/21 0423 03/02/21 5427 03/03/21 0623 03/03/21 1739 03/04/21 0134 03/05/21 0150 03/06/21 0140 03/07/21 7628 03/08/21 0214 03/09/21 0255 03/12/21 0132  BUN 30* 28* 26*  --  23 28* 24* 25* 28* 26* 31*  CREATININE 1.70* 1.58* 1.62* 1.56* 1.72* 1.63* 1.64* 1.69* 1.50* 1.59* 1.26*  -Recheck renal function in about a week. -Holding losartan for  another 3 to 4 days   Type 2 diabetes mellitus with diabetic neuropathy, without long-term current use of insulin ([place])- (present on admission) Hemoglobin A1c 11.6% on 2/80/2023. Recent Labs  Lab 03/12/21 1159 03/12/21 1644 03/12/21 2018 03/13/21 0629 03/13/21 1105  GLUCAP 213* 260* 160* 180* 333*  -Resume home glipizide, Ozempic and Jardiance -Increase home Lantus from 10 to 20 units daily -Added SSI-moderate -Closely monitor CBG and adjust insulin as appropriate -On Repatha due to statin intolerance    Hyponatremia Partly from hyperglycemia and CKD. -Manage hypoglycemia as above  Bilateral lower extremity edema- (present on admission) LE [place] negative for DVT.  Improved.  Diabetic peripheral neuropathy ([place])- (present on admission) -Resume Lyrica.  Hyperlipidemia- (present on admission) On Repatha due to statin intolerance  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 female breast ([place])- (present on admission) Remote. -Outpatient follow-up  GERD- (present on admission) - Continue PPI  Coronary atherosclerosis- (present on admission) See elevated troponin  Essential hypertension- (present on admission) Metoprolol as above.   Hypothyroidism- (present on admission) -Continue home Synthroid  Diplopia-resolved as of 03/09/2021, (present on admission) Resolved.  Influenza A-resolved as of 03/13/2021, (present on admission) No respiratory symptoms.  No indication for treatment or isolation precaution         Pain control - [facility] Controlled Substance Reporting System database was reviewed. and patient was instructed, not to drive, operate heavy machinery, perform activities at heights, swimming or participation in water activities or provide baby-sitting services while on Pain, Sleep and Anxiety Medications; until their outpatient Physician has advised to do so again. Also recommended to not to take more than prescribed Pain, Sleep and Anxiety  Medications.   Consultants: Vascular surgery and neurology Procedures performed: Aortic and LLE angiogram on 03/03/2021.  Left AKA on 03/06/2021 Disposition: Rehabilitation facility Diet recommendation:  Discharge Diet Orders (From admission, onward)     Start     Ordered   03/13/21 0000  Diet - low sodium heart healthy        03/13/21 1136   03/13/21 0000  Diet Carb Modified        03/13/21 1136           Cardiac and Carb modified diet  DISCHARGE MEDICATION: Allergies as of 03/13/2021       Reactions   Codeine Nausea And Vomiting, Other (See Comments)   Severe stomach cramps, also   Antihistamines, Diphenhydramine-type Other (See Comments)   Causes hyperactivity   Gabapentin    Urinary incontinence   Statins Other (See Comments)   Joint pains   Sulfa Antibiotics Other (See Comments)   Reaction not recalled   Erythromycin Hives, Rash, Other (See Comments)   Allergic due to dental work from 50 years ago. It was used in packing and resulted in rash/hives inside and outside of mouth.   Trulicity [dulaglutide] Itching        Medication List     STOP taking these medications    colchicine 0.6 MG tablet   Magnesium 250 MG Tabs   nystatin 100000 UNIT/ML suspension Commonly known as: MYCOSTATIN       TAKE these medications    Accu-Chek FastClix Lancets Misc USE TO CHECK BLOOD SUGARS 3 TIMES DAILY   acetaminophen 500 MG tablet Commonly  known as: TYLENOL Take 500 mg by mouth every 6 (six) hours as needed for mild pain or headache.   aspirin EC 81 MG tablet Take 81 mg by mouth in the morning.   blood glucose meter kit and supplies Dispense based on patient and insurance preference. Use up to four times daily as directed. (FOR ICD-10 E10.9, E11.9).   Blood Glucose Monitoring Suppl Devi by Does not apply route. Accu Check Guide   calcium carbonate 500 MG chewable tablet Commonly known as: TUMS - dosed in mg elemental calcium Chew 3 tablets by mouth at  bedtime.   feeding supplement Liqd Take 237 mLs by mouth 2 (two) times daily between meals.   fluconazole 150 MG tablet Commonly known as: DIFLUCAN Take 150 mg by mouth once.   glipiZIDE 10 MG 24 hr tablet Commonly known as: GLUCOTROL XL Take 1 tablet (10 mg total) by mouth daily with breakfast.   glucose blood test strip 1 each by Other route as needed for other. Use as instructed  Please dispense Accu check Guide Test strips   Accu-Chek Guide test strip Generic drug: glucose blood TEST ONCE DAILY   HYDROcodone-acetaminophen 7.5-325 MG tablet Commonly known as: NORCO Take 1 tablet by mouth every 6 (six) hours as needed for up to 5 days for severe pain or moderate pain.   icosapent Ethyl 1 g capsule Commonly known as: Vascepa Take 2 capsules (2 g total) by mouth 2 (two) times daily.   insulin aspart 100 UNIT/ML injection Commonly known as: novoLOG Inject 0-15 Units into the skin 3 (three) times daily with meals. CBG < 70: Implement Hypoglycemia Standing Orders and refer to Hypoglycemia Standing Orders sidebar report CBG 70 - 120: 0 units CBG 121 - 150: 2 units CBG 151 - 200: 3 units CBG 201 - 250: 5 units CBG 251 - 300: 8 units CBG 301 - 350: 11 units CBG 351 - 400: 15 units CBG > 400: call MD and obtain STAT lab verification   insulin glargine 100 UNIT/ML Solostar Pen Commonly known as: LANTUS Inject 20 Units into the skin daily. What changed: how much to take   Jardiance 10 MG Tabs tablet Generic drug: empagliflozin TAKE 1 TABLET(10 MG) BY MOUTH DAILY BEFORE BREAKFAST What changed: See the new instructions.   levothyroxine 100 MCG tablet Commonly known as: SYNTHROID Take 100 mcg by mouth daily before breakfast.   losartan 50 MG tablet Commonly known as: COZAAR Take 1 tablet (50 mg total) by mouth daily. Start taking on: March 16, 2021 What changed: These instructions start on March 16, 2021. If you are unsure what to do until then, ask your doctor  or other care provider.   metoprolol tartrate 50 MG tablet Commonly known as: LOPRESSOR Take 1 tablet (50 mg total) by mouth 2 (two) times daily. What changed:  medication strength how much to take how to take this when to take this additional instructions   multivitamin with minerals Tabs tablet Take 1 tablet by mouth daily. Start taking on: March 14, 2021   Ozempic (0.25 or 0.5 MG/DOSE) 2 MG/1.5ML Sopn Generic drug: Semaglutide(0.25 or 0.5MG/DOS) Inject 0.5 mg into the skin once a week. What changed: when to take this   pantoprazole 40 MG tablet Commonly known as: PROTONIX Take 1 tablet (40 mg total) by mouth daily before breakfast.   PARoxetine 20 MG tablet Commonly known as: PAXIL Take 1 tablet (20 mg total) by mouth in the morning.   pregabalin 75 MG capsule  Commonly known as: Lyrica Take 1 capsule (75 mg total) by mouth 2 (two) times daily.   Repatha SureClick 579 MG/ML Soaj Generic drug: Evolocumab Inject 1 pen into the skin every 14 (fourteen) days. What changed: how much to take   senna-docusate 8.6-50 MG tablet Commonly known as: Senokot-S Take 1 tablet by mouth 2 (two) times daily as needed for moderate constipation.   SIMPONI ARIA IV Inject into the vein every 2 (two) months.   traZODone 50 MG tablet Commonly known as: DESYREL Take 0.5 tablets (25 mg total) by mouth at bedtime as needed for sleep.   valACYclovir 1000 MG tablet Commonly known as: VALTREX Take 1 tablet (1,000 mg total) by mouth daily as needed (Fever Blisters).   Vitamin D3 25 MCG (1000 UT) Caps Take 2,000 Units by mouth daily before lunch.               Discharge Care Instructions  (From admission, onward)           Start     Ordered   03/13/21 0000  Discharge wound care:       Comments: Cover stump with gauze and ace wrap. Monitor for healing and signs of infection. Follow up with vascular surgery for staple removal   03/13/21 1136            Follow-up  Information     [facility] Follow up in 4 week(s).   Specialty: Vascular Surgery Why: Office will call you to arrange your appt (sent) Contact information: [place] [phone]                Discharge Exam: Filed Weights   02/28/21 1900  Weight: 66.7 kg   Vitals:   03/12/21 2000 03/13/21 0625 03/13/21 0804 03/13/21 1100  BP: (!) 150/70 (!) 152/81 118/63 (!) 164/76  Pulse:  91 100   Resp: '19 18 19   ' Temp:  98 F (36.7 C) 98.1 F (36.7 C)   TempSrc:  Oral Oral   SpO2:  100% 98%   Weight:      Height:         Condition at discharge: good  The results of significant diagnostics from this hospitalization (including imaging, microbiology, ancillary and laboratory) are listed below for reference.   Imaging Studies: CT Head Wo Contrast  Result Date: 02/28/2021 CLINICAL DATA:  Head trauma.  Weakness. EXAM: CT HEAD WITHOUT CONTRAST TECHNIQUE: Contiguous axial images were obtained from the base of the skull through the vertex without intravenous contrast. RADIATION DOSE REDUCTION: This exam was performed according to the departmental dose-optimization program which includes automated exposure control, adjustment of the mA and/or kV according to patient size and/or use of iterative reconstruction technique. COMPARISON:  Head MRI 11/05/2017 FINDINGS: Brain: There is no evidence of an acute infarct, intracranial hemorrhage, mass, midline shift, or extra-axial fluid collection. The ventricles and sulci are within normal limits for age. Hypodensities in the cerebral white matter nonspecific but compatible with mild chronic small vessel ischemic disease. Vascular: Calcified atherosclerosis at the skull base. No hyperdense vessel. Skull: No acute fracture or suspicious osseous lesion. Sinuses/Orbits: Visualized paranasal sinuses and mastoid air cells are clear. Visualized orbits are unremarkable. Other: None.  IMPRESSION: 1. No evidence of acute intracranial abnormality. 2. Mild chronic small vessel ischemic disease. Electronically Signed   By: Logan Bores M.D.   On: 02/28/2021 11:58   MR BRAIN WO CONTRAST  Result Date: 03/01/2021 CLINICAL  DATA:  Diplopia EXAM: MRI HEAD WITHOUT CONTRAST TECHNIQUE: Multiplanar, multiecho pulse sequences of the brain and surrounding structures were obtained without intravenous contrast. COMPARISON:  2019 FINDINGS: Brain: There is no acute infarction or intracranial hemorrhage. There is no intracranial mass, mass effect, or edema. There is no hydrocephalus or extra-axial fluid collection. Prominence of ventricles and sulci reflects parenchymal volume loss. Patchy T2 hyperintensity in the supratentorial white matter is nonspecific but may reflect mild chronic microvascular ischemic changes. Vascular: Major vessel flow voids at the skull base are preserved. Skull and upper cervical spine: Normal marrow signal is preserved. Sinuses/Orbits: Paranasal sinuses are aerated. Orbits are unremarkable. Other: Sella is unremarkable.  Mastoid air cells are clear. IMPRESSION: No evidence of recent infarction, hemorrhage, or mass. Mild chronic microvascular ischemic changes. Electronically Signed   By: Macy Mis M.D.   On: 03/01/2021 16:15   MR THORACIC SPINE WO CONTRAST  Result Date: 03/01/2021 CLINICAL DATA:  Initial evaluation for low back pain, cauda equina syndrome. EXAM: MRI THORACIC AND LUMBAR SPINE WITHOUT CONTRAST TECHNIQUE: Multiplanar and multiecho pulse sequences of the thoracic and lumbar spine were obtained without intravenous contrast. COMPARISON:  None. FINDINGS: MRI THORACIC SPINE FINDINGS Alignment: Mild dextroscoliosis. Alignment otherwise normal with preservation of the normal thoracic kyphosis. No listhesis. Vertebrae: Vertebral body height maintained without acute or chronic fracture. Bone marrow signal intensity within normal limits. No discrete or worrisome osseous  lesions or abnormal marrow edema. Cord:  Normal signal and morphology. Paraspinal and other soft tissues: Unremarkable. Disc levels: Normal for age multilevel disc desiccation seen throughout the thoracic spine. No significant disc bulge or focal disc herniation. No significant facet pathology. No canal or neural foraminal stenosis or evidence for neural impingement. MRI LUMBAR SPINE FINDINGS Segmentation:  Examination degraded by motion artifact. Standard segmentation. Lowest well-formed disc space labeled the L5-S1 level. Alignment: Dextroscoliosis with straightening of the normal lumbar lordosis. Trace degenerative retrolisthesis of L3 on L4 through L5 on S1. Vertebrae: Vertebral body height maintained without acute or chronic fracture. Bone marrow signal intensity within normal limits. No worrisome osseous lesions. Discogenic reactive endplate change present about the L3-4 interspace. No other abnormal marrow edema. Conus medullaris and cauda equina: Conus extends to the L1 level. Conus and cauda equina appear normal. Paraspinal and other soft tissues: Chronic postoperative changes present within the posterior paraspinous soft tissues. No adverse features. Few scattered benign appearing cyst noted about the kidneys. Visualized visceral structures otherwise unremarkable. Disc levels: L1-2: Mild disc bulge with disc desiccation and reactive endplate spurring. Disc bulging asymmetric to the right. Mild facet and ligament flavum hypertrophy. No significant spinal stenosis. Foramina remain patent. L2-3: Mild intervertebral disc space narrowing with diffuse disc bulge and disc desiccation. Associated reactive endplate spurring. Changes asymmetric to the left. Mild to moderate bilateral facet hypertrophy. Resultant mild to moderate canal with left lateral recess stenosis. Mild left L2 foraminal narrowing. Right neural foramen remains patent. L3-4: Degenerative intervertebral disc space narrowing with disc desiccation  and diffuse disc bulge. Associated discogenic reactive endplate change with marginal endplate osteophytic spurring. Moderate bilateral facet hypertrophy. Probable remote posterior decompression. Persistent moderate to severe canal with left worse than right lateral recess stenosis. Moderate bilateral L3 foraminal narrowing. L4-5: Mild disc bulge with disc desiccation, asymmetric to the right. Associated reactive endplate spurring, also worse on the right. Severe right worse than left facet arthrosis. Prior posterior decompression. Residual mild narrowing of the lateral recesses bilaterally. Central canal remains patent. Moderate right with mild left L4 foraminal stenosis.  L5-S1: Mild disc bulge with disc desiccation. Moderate left worse than right facet arthrosis. Prior posterior decompression. No significant spinal stenosis. Moderate right with mild left L5 foraminal narrowing. IMPRESSION: MR THORACIC SPINE IMPRESSION: No acute abnormality within the thoracic spine. No significant disc pathology or stenosis. No neural impingement. MR LUMBAR SPINE IMPRESSION: 1. No acute abnormality within the lumbar spine. 2. Postoperative changes from prior posterior decompression at L3-4 through L5-S1. Persistent moderate to severe canal with left worse than right lateral recess stenosis at the L3-4 level with moderate bilateral L3 foraminal narrowing. 3. Right eccentric disc osteophyte and facet arthrosis at L4-5 and L5-S1 with resultant moderate right L4 and L5 foraminal stenosis. 4. Left eccentric disc bulge and facet hypertrophy at L2-3 with resultant mild to moderate canal and left lateral recess stenosis. Electronically Signed   By: Jeannine Boga M.D.   On: 03/01/2021 00:11   MR LUMBAR SPINE WO CONTRAST  Result Date: 03/01/2021 CLINICAL DATA:  Initial evaluation for low back pain, cauda equina syndrome. EXAM: MRI THORACIC AND LUMBAR SPINE WITHOUT CONTRAST TECHNIQUE: Multiplanar and multiecho pulse sequences of the  thoracic and lumbar spine were obtained without intravenous contrast. COMPARISON:  None. FINDINGS: MRI THORACIC SPINE FINDINGS Alignment: Mild dextroscoliosis. Alignment otherwise normal with preservation of the normal thoracic kyphosis. No listhesis. Vertebrae: Vertebral body height maintained without acute or chronic fracture. Bone marrow signal intensity within normal limits. No discrete or worrisome osseous lesions or abnormal marrow edema. Cord:  Normal signal and morphology. Paraspinal and other soft tissues: Unremarkable. Disc levels: Normal for age multilevel disc desiccation seen throughout the thoracic spine. No significant disc bulge or focal disc herniation. No significant facet pathology. No canal or neural foraminal stenosis or evidence for neural impingement. MRI LUMBAR SPINE FINDINGS Segmentation:  Examination degraded by motion artifact. Standard segmentation. Lowest well-formed disc space labeled the L5-S1 level. Alignment: Dextroscoliosis with straightening of the normal lumbar lordosis. Trace degenerative retrolisthesis of L3 on L4 through L5 on S1. Vertebrae: Vertebral body height maintained without acute or chronic fracture. Bone marrow signal intensity within normal limits. No worrisome osseous lesions. Discogenic reactive endplate change present about the L3-4 interspace. No other abnormal marrow edema. Conus medullaris and cauda equina: Conus extends to the L1 level. Conus and cauda equina appear normal. Paraspinal and other soft tissues: Chronic postoperative changes present within the posterior paraspinous soft tissues. No adverse features. Few scattered benign appearing cyst noted about the kidneys. Visualized visceral structures otherwise unremarkable. Disc levels: L1-2: Mild disc bulge with disc desiccation and reactive endplate spurring. Disc bulging asymmetric to the right. Mild facet and ligament flavum hypertrophy. No significant spinal stenosis. Foramina remain patent. L2-3: Mild  intervertebral disc space narrowing with diffuse disc bulge and disc desiccation. Associated reactive endplate spurring. Changes asymmetric to the left. Mild to moderate bilateral facet hypertrophy. Resultant mild to moderate canal with left lateral recess stenosis. Mild left L2 foraminal narrowing. Right neural foramen remains patent. L3-4: Degenerative intervertebral disc space narrowing with disc desiccation and diffuse disc bulge. Associated discogenic reactive endplate change with marginal endplate osteophytic spurring. Moderate bilateral facet hypertrophy. Probable remote posterior decompression. Persistent moderate to severe canal with left worse than right lateral recess stenosis. Moderate bilateral L3 foraminal narrowing. L4-5: Mild disc bulge with disc desiccation, asymmetric to the right. Associated reactive endplate spurring, also worse on the right. Severe right worse than left facet arthrosis. Prior posterior decompression. Residual mild narrowing of the lateral recesses bilaterally. Central canal remains patent. Moderate right with mild  left L4 foraminal stenosis. L5-S1: Mild disc bulge with disc desiccation. Moderate left worse than right facet arthrosis. Prior posterior decompression. No significant spinal stenosis. Moderate right with mild left L5 foraminal narrowing. IMPRESSION: MR THORACIC SPINE IMPRESSION: No acute abnormality within the thoracic spine. No significant disc pathology or stenosis. No neural impingement. MR LUMBAR SPINE IMPRESSION: 1. No acute abnormality within the lumbar spine. 2. Postoperative changes from prior posterior decompression at L3-4 through L5-S1. Persistent moderate to severe canal with left worse than right lateral recess stenosis at the L3-4 level with moderate bilateral L3 foraminal narrowing. 3. Right eccentric disc osteophyte and facet arthrosis at L4-5 and L5-S1 with resultant moderate right L4 and L5 foraminal stenosis. 4. Left eccentric disc bulge and facet  hypertrophy at L2-3 with resultant mild to moderate canal and left lateral recess stenosis. Electronically Signed   By: Jeannine Boga M.D.   On: 03/01/2021 00:11   PERIPHERAL VASCULAR CATHETERIZATION  Result Date: 03/05/2021 DATE OF SERVICE: 03/03/2021  PATIENT:  Vicki Mallet  81 y.o. female  PRE-OPERATIVE DIAGNOSIS:  Atherosclerosis of native arteries of left lower extremity causing ulceration  POST-OPERATIVE DIAGNOSIS:  Same  PROCEDURE:  1) US guided right common femoral artery access 2) Aortogram 3) Left lower extremity angiogram with second order cannulation (11m total contrast) 4) Conscious sedation (35 minutes)  SURGEON:  TYevonne Aline HStanford Breed MD  ASSISTANT: none  ANESTHESIA:   local and IV sedation  ESTIMATED BLOOD LOSS: minimal  LOCAL MEDICATIONS USED:  LIDOCAINE  COUNTS: confirmed correct.  PATIENT DISPOSITION:  PACU - hemodynamically stable.  Delay start of Pharmacological VTE agent (>24hrs) due to surgical blood loss or risk of bleeding: no  INDICATION FOR PROCEDURE: PDAVISHA LINTHICUMis a 81y.o. 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  She had an angiogram about 3 years ago which showed multilevel stenosis; at that time she was having claudication symptoms, and intervention was not indicated.  Since, her ABI has deteriorated.  She has developed a new ulcer on her left great toe.  After careful discussion of risks, benefits, and alternatives the patient was offered angiography. The patient understood and wished to proceed.  OPERATIVE FINDINGS: Terminal aorta and iliac arteries: Diminutive, heavily diseased, but widely patent  Left lower extremity: Diminutive, diffuse disease throughout the leg Common femoral artery: Widely patent Profunda femoris artery: Widely patent Superficial femoral artery: Occludes in mid to distal thigh. Popliteal artery: Reconstitutes above the knee.  Diffusely diseased popliteal artery. Anterior tibial artery: Occluded  Tibioperoneal trunk: Diffusely diseased, severe stenosis just proximal intake of the peroneal artery (90%) Peroneal artery: Diffusely diseased, multiple areas of severe stenosis throughout (90+%) Posterior tibial artery: Occluded Pedal circulation: Does not fill via collateralization from the peroneal artery.  No pedal outflow observed.  DESCRIPTION OF PROCEDURE: After identification of the patient in the pre-operative holding area, the patient was transferred to the operating room. The patient was positioned supine on the operating room table. Anesthesia was induced. The groins was prepped and draped in standard fashion. A surgical pause was performed confirming correct patient, procedure, and operative location.  The right groin was anesthetized with subcutaneous injection of 1% lidocaine. Using ultrasound guidance, the right common femoral artery was accessed with micropuncture technique. Fluoroscopy was used to confirm cannulation over the femoral head. The 52F sheath was upsized to 19F.  A Benson wire was advanced into the distal aorta. Over the wire an omni flush catheter was advanced to the level  of L2. Aortogram was performed - see above for details.  The left common iliac artery was selected with a wire guidewire. The wire was advanced into the common femoral artery. Over the wire the omni flush catheter was advanced into the external iliac artery. Selective angiography was performed - see above for details.  All endovascular equipment was removed.  The sheath was left in place, to be removed in the recovery area.  Conscious sedation was administered with the use of IV fentanyl and midazolam under continuous physician and nurse monitoring.  Heart rate, blood pressure, and oxygen saturation were continuously monitored.  Total sedation time was 35 minutes  Upon completion of the case instrument and sharps counts were confirmed correct. The patient was transferred to the PACU in good condition. I was present for  all portions of the procedure.  PLAN: No options for revascularization given severely disadvantaged outflow into the foot.  Her 2 options from a vascular standpoint are transition to palliative care, or palliative left above-knee amputation.  She desires an amputation.  We will plan to do this on Monday.  [name]. Stanford Breed, MD [facility] Phone Number: [phone] 03/03/2021 2:25 PM   DG Chest Port 1 View  Result Date: 02/28/2021 CLINICAL DATA:  Weakness EXAM: PORTABLE CHEST 1 VIEW COMPARISON:  01/20/2021 FINDINGS: Prior valve replacement and CABG. Insert COPD Heart and mediastinal contours are within normal limits. No focal opacities or effusions. No acute bony abnormality. IMPRESSION: COPD.  No active disease. Electronically Signed   By: Rolm Baptise M.D.   On: 02/28/2021 19:21   DG Foot 2 Views Left  Result Date: 02/26/2021 CLINICAL DATA:  Left great toe pain.  History of gout. EXAM: LEFT FOOT - 2 VIEW COMPARISON:  None. FINDINGS: There is no evidence of fracture or dislocation. Mild hallux valgus deformity is seen involving the first metatarsophalangeal joint. Minimal narrowing of this joint space is noted. Soft tissues are unremarkable. 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 No acute abnormality is noted. Electronically Signed   By: Marijo Conception M.D.   On: 02/26/2021 10:58   VAS [place] ABI WITH/WO TBI  Result Date: 03/01/2021  LOWER EXTREMITY DOPPLER STUDY Patient Name:  LOVENE MARET  Date of Exam:   03/01/2021 Medical Rec #: [id number]        Accession #:    [id number] Date of Birth: 1940-05-30       Patient Gender: F Patient Age:   81 years Exam Location:  [facility] Procedure:      VAS [place] ABI WITH/WO TBI Referring Phys: Nyoka Lint DOUTOVA --------------------------------------------------------------------------------  Indications: Peripheral artery disease, and  leg numbness. High Risk Factors: Hypertension, hyperlipidemia, Diabetes, past history of                    smoking, coronary artery disease. Other Factors: CKD4, S/p CABG & AVR, PAD.  Vascular Interventions: RLE angioplasty and stent placement (04/29/2019) & LLE                         arteriogram without intervention (07/01/2019). Comparison Study: Previous exam 09/09/2020 RT ABI 0.65 LT 0.80 Performing Technologist: Hill, Jody RVT, RDMS  Examination Guidelines: A complete evaluation includes at minimum, Doppler waveform signals and systolic blood pressure reading at the level of bilateral brachial, anterior tibial, and posterior tibial arteries, when vessel segments are  accessible. Bilateral testing is considered an integral part of a complete examination. Photoelectric Plethysmograph (PPG) waveforms and toe systolic pressure readings are included as required and additional duplex testing as needed. Limited examinations for reoccurring indications may be performed as noted.  ABI Findings: +---------+------------------+-----+----------+--------+  Right     Rt Pressure (mmHg) Index Waveform   Comment   +---------+------------------+-----+----------+--------+  Brachial  187                      triphasic            +---------+------------------+-----+----------+--------+  PTA       88                 0.47  monophasic           +---------+------------------+-----+----------+--------+  DP        78                 0.42  monophasic           +---------+------------------+-----+----------+--------+  Great Toe                          Absent               +---------+------------------+-----+----------+--------+ +---------+------------------+-----+----------+-------+  Left      Lt Pressure (mmHg) Index Waveform   Comment  +---------+------------------+-----+----------+-------+  Brachial  183                      triphasic           +---------+------------------+-----+----------+-------+  PTA       44                 0.24   monophasic          +---------+------------------+-----+----------+-------+  DP        61                 0.33  monophasic          +---------+------------------+-----+----------+-------+  Great Toe                          Absent              +---------+------------------+-----+----------+-------+ +-------+-----------+-----------+------------+------------+  ABI/TBI Today's ABI Today's TBI Previous ABI Previous TBI  +-------+-----------+-----------+------------+------------+  Right   0.47        Absent      0.65         0.24          +-------+-----------+-----------+------------+------------+  Left    0.33        Absent      0.80         0.43          +-------+-----------+-----------+------------+------------+ Bilateral ABIs significantly decreased since last examination in 08/2020 L > R Bilateral TBIs have become absent since last examination.  Summary: Right: Resting right ankle-brachial index indicates severe right lower extremity arterial disease. The right toe-brachial index is absent. Left: Resting left ankle-brachial index indicates severe left lower extremity arterial disease. The left toe-brachial index is absent.  *See table(s) above for measurements and observations.  Vascular consult recommended. Electronically signed by Harold Barban MD on 03/01/2021 at 9:00:20 PM.    Final    ECHOCARDIOGRAM COMPLETE  Result Date: 03/01/2021    ECHOCARDIOGRAM REPORT   Patient Name:   Vicki Mallet Date of Exam:  03/01/2021 Medical Rec #:  [id number]       Height:       63.0 in Accession #:    [id number]      Weight:       147.0 lb Date of Birth:  Jun 02, 1940      BSA:          1.697 m Patient Age:    62 years        BP:           135/69 mmHg Patient Gender: F               HR:           96 bpm. Exam Location:  Inpatient Procedure: 2D Echo, Color Doppler and Cardiac Doppler Indications:    Elevated troponin  History:        Patient has prior history of Echocardiogram examinations. Risk                  Factors:Hypertension, Dyslipidemia and Former Smoker.                 Aortic Valve: 23 mm Edwards Inspiris Resilia valve is present in                 the aortic position.  Sonographer:    Jyl Heinz Referring Phys: [place]  1. Left ventricular ejection fraction, by estimation, is 55 to 60%. The left ventricle has normal function. The left ventricle has no regional wall motion abnormalities. Left ventricular diastolic parameters are consistent with Grade I diastolic dysfunction (impaired relaxation).  2. Right ventricular systolic function is normal. The right ventricular size is normal. Tricuspid regurgitation signal is inadequate for assessing PA pressure.  3. The mitral valve is grossly normal. Mild mitral valve regurgitation.  4. 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 Not well visualized, no significant paravalvular leak. AV max velocity 1.7 m/s. Aortic valve regurgitation is not visualized. There is a 23 mm Edwards Inspiris Resilia valve present in the aortic position.  5. The inferior vena cava is normal in size with greater than 50% respiratory variability, suggesting right atrial pressure of 3 mmHg. Comparison(s): No significant change from prior study. FINDINGS  Left Ventricle: Left ventricular ejection fraction, by estimation, is 55 to 60%. The left ventricle has normal function. The left ventricle has no regional wall motion abnormalities. The left ventricular internal cavity size was normal in size. There is  no left ventricular hypertrophy. Left ventricular diastolic parameters are consistent with Grade I diastolic dysfunction (impaired relaxation). Right Ventricle: The right ventricular size is normal. No increase in right ventricular wall thickness. Right ventricular systolic function is normal. Tricuspid regurgitation signal is inadequate for assessing PA pressure. Left Atrium: Left atrial size was normal in size. Right Atrium: Right atrial size was normal in  size. Pericardium: There is no evidence of pericardial effusion. Mitral Valve: The mitral valve is grossly normal. Mild mitral valve regurgitation. MV peak gradient, 13.8 mmHg. The mean mitral valve gradient is 7.0 mmHg. Tricuspid Valve: The tricuspid valve is normal in structure. Tricuspid valve regurgitation is not demonstrated. Aortic Valve: S/p 23 mm Edwards Inspiris Resilia Valve 05/04/2020. Not well visualized, no significant paravalvular leak. AV max velocity 1.7 m/s. Aortic valve regurgitation is not visualized. Aortic valve peak gradient measures 12.2 mmHg. There is a 23 mm Edwards Inspiris Resilia valve present in the aortic position. Pulmonic Valve: The pulmonic valve was not well visualized. Aorta: The  aortic root is normal in size and structure. Venous: The inferior vena cava is normal in size with greater than 50% respiratory variability, suggesting right atrial pressure of 3 mmHg. IAS/Shunts: No atrial level shunt detected by color flow Doppler.  LEFT VENTRICLE PLAX 2D LVIDd:         4.00 cm     Diastology LVIDs:         2.80 cm     LV e' medial:    3.37 cm/s LV PW:         0.90 cm     LV E/e' medial:  38.3 LV IVS:        0.90 cm     LV e' lateral:   4.13 cm/s LVOT diam:     2.00 cm     LV E/e' lateral: 31.2 LV SV:         55 LV SV Index:   32 LVOT Area:     3.14 cm  LV Volumes (MOD) LV vol d, MOD A2C: 88.8 ml LV vol d, MOD A4C: 80.9 ml LV vol s, MOD A2C: 36.5 ml LV vol s, MOD A4C: 33.7 ml LV SV MOD A2C:     52.3 ml LV SV MOD A4C:     80.9 ml LV SV MOD BP:      48.7 ml RIGHT VENTRICLE            IVC RV Basal diam:  2.90 cm    IVC diam: 1.20 cm RV Mid diam:    2.30 cm RV S prime:     6.64 cm/s TAPSE (M-mode): 1.5 cm LEFT ATRIUM             Index        RIGHT ATRIUM           Index LA diam:        3.80 cm 2.24 cm/m   RA Area:     13.60 cm LA Vol (A2C):   54.4 ml 32.06 ml/m  RA Volume:   32.10 ml  18.92 ml/m LA Vol (A4C):   37.7 ml 22.22 ml/m LA Biplane Vol: 46.5 ml 27.41 ml/m  AORTIC VALVE AV Area  (Vmax): 1.69 cm AV Vmax:        174.50 cm/s AV Peak Grad:   12.2 mmHg LVOT Vmax:      93.95 cm/s LVOT Vmean:     66.700 cm/s LVOT VTI:       0.174 m  AORTA Ao Root diam: 3.10 cm MITRAL VALVE MV Area (PHT): 5.38 cm     SHUNTS MV Area VTI:   1.79 cm     Systemic VTI:  0.17 m MV Peak grad:  13.8 mmHg    Systemic Diam: 2.00 cm MV Mean grad:  7.0 mmHg MV Vmax:       1.86 m/s MV Vmean:      128.0 cm/s MV Decel Time: 141 msec MR Peak grad: 149.8 mmHg MR Mean grad: 103.0 mmHg MR Vmax:      612.00 cm/s MR Vmean:     491.0 cm/s MV E velocity: 129.00 cm/s MV A velocity: 174.00 cm/s MV E/A ratio:  0.74 Mary Scientist, physiological signed by [name] Signature Date/Time: 03/01/2021/3:04:51 PM    Final    VAS [place] LOWER EXTREMITY VENOUS (DVT)  Result Date: 03/01/2021  Lower Venous DVT Study Patient Name:  HANIFA ANTONETTI  Date of Exam:   03/01/2021 Medical Rec #: [id number]  Accession #:    [id number] Date of Birth: 10-Aug-1940       Patient Gender: F Patient Age:   10 years Exam Location:  [facility] Procedure:      VAS [place] LOWER EXTREMITY VENOUS (DVT) Referring Phys: Nyoka Lint DOUTOVA --------------------------------------------------------------------------------  Other Indications: Edema per MD order. No edema present upon examination. Comparison Study: No previous LEV exams Performing Technologist: Jody Hill RVT, RDMS  Examination Guidelines: A complete evaluation includes B-mode imaging, spectral Doppler, color Doppler, and power Doppler as needed of all accessible portions of each vessel. Bilateral testing is considered an integral part of a complete examination. Limited examinations for reoccurring indications may be performed as noted. The reflux portion of the exam is performed with the patient in reverse Trendelenburg.  +---------+---------------+---------+-----------+----------+--------------+  RIGHT     Compressibility Phasicity Spontaneity Properties Thrombus Aging   +---------+---------------+---------+-----------+----------+--------------+  CFV       Full            Yes       Yes                                    +---------+---------------+---------+-----------+----------+--------------+  SFJ       Full                                                             +---------+---------------+---------+-----------+----------+--------------+  FV Prox   Full            Yes       Yes                                    +---------+---------------+---------+-----------+----------+--------------+  FV Mid    Full            Yes       Yes                                    +---------+---------------+---------+-----------+----------+--------------+  FV Distal Full            Yes       Yes                                    +---------+---------------+---------+-----------+----------+--------------+  PFV       Full                                                             +---------+---------------+---------+-----------+----------+--------------+  POP       Full            Yes       Yes                                    +---------+---------------+---------+-----------+----------+--------------+  PTV       Full                                                             +---------+---------------+---------+-----------+----------+--------------+  PERO      Full                                                             +---------+---------------+---------+-----------+----------+--------------+   +---------+---------------+---------+-----------+----------+--------------+  LEFT      Compressibility Phasicity Spontaneity Properties Thrombus Aging  +---------+---------------+---------+-----------+----------+--------------+  CFV       Full            Yes       Yes                                    +---------+---------------+---------+-----------+----------+--------------+  SFJ       Full                                                              +---------+---------------+---------+-----------+----------+--------------+  FV Prox   Full            Yes       Yes                                    +---------+---------------+---------+-----------+----------+--------------+  FV Mid    Full            Yes       Yes                                    +---------+---------------+---------+-----------+----------+--------------+  FV Distal Full            Yes       Yes                                    +---------+---------------+---------+-----------+----------+--------------+  PFV       Full                                                             +---------+---------------+---------+-----------+----------+--------------+  POP       Full            Yes       Yes                                    +---------+---------------+---------+-----------+----------+--------------+  PTV       Full                                                             +---------+---------------+---------+-----------+----------+--------------+  PERO      Full                                                             +---------+---------------+---------+-----------+----------+--------------+     Summary: BILATERAL: - No evidence of deep vein thrombosis seen in the lower extremities, bilaterally. - No evidence of superficial venous thrombosis in the lower extremities, bilaterally. -No evidence of popliteal cyst, bilaterally.   *See table(s) above for measurements and observations. Electronically signed by Harold Barban MD on 03/01/2021 at 9:00:56 PM.    Final     Microbiology: Results for orders placed or performed during the hospital encounter of 02/28/21  Resp Panel by RT-PCR (Flu A&B, Covid) Nasopharyngeal Swab     Status: Abnormal   Collection Time: 02/28/21  6:41 PM   Specimen: Nasopharyngeal Swab; Nasopharyngeal(NP) swabs in vial transport medium  Result Value Ref Range Status   SARS Coronavirus 2 by RT PCR NEGATIVE NEGATIVE Final    Comment: (NOTE) SARS-CoV-2 target  nucleic acids are NOT DETECTED.  The SARS-CoV-2 RNA is generally detectable in upper respiratory specimens during the acute phase of infection. The lowest concentration of SARS-CoV-2 viral copies this assay can detect is 138 copies/mL. A negative result does not preclude SARS-Cov-2 infection and should not be used as the sole basis for treatment or other patient management decisions. A negative result may occur with  improper specimen collection/handling, submission of specimen other than nasopharyngeal swab, presence of viral mutation(s) within the areas targeted by this assay, and inadequate number of viral copies(<138 copies/mL). A negative result must be combined with clinical observations, patient history, and epidemiological information. The expected result is Negative.  Fact Sheet for Patients:  EntrepreneurPulse.com.au  Fact Sheet for Healthcare Providers:  IncredibleEmployment.be  This test is no t yet approved or cleared by the [place] FDA and  has been authorized for detection and/or diagnosis of SARS-CoV-2 by FDA under an Emergency Use Authorization (EUA). This EUA will remain  in effect (meaning this test can be used) for the duration of the COVID-19 declaration under Section 564(b)(1) of the Act, 21 U.S.C.section 360bbb-3(b)(1), unless the authorization is terminated  or revoked sooner.       Influenza A by PCR POSITIVE (A) NEGATIVE Final   Influenza B by PCR NEGATIVE NEGATIVE Final    Comment: (NOTE) The Xpert Xpress SARS-CoV-2/FLU/RSV plus assay is intended as an aid in the diagnosis of influenza from Nasopharyngeal swab specimens and should not be used as a sole basis for treatment. Nasal washings and aspirates are unacceptable for Xpert Xpress SARS-CoV-2/FLU/RSV testing.  Fact Sheet for Patients: EntrepreneurPulse.com.au  Fact Sheet for Healthcare  Providers: IncredibleEmployment.be  This test is not yet approved or cleared by the [place] FDA and has been authorized for detection and/or diagnosis of SARS-CoV-2 by FDA under an Emergency Use Authorization (EUA). This EUA will remain in effect (meaning  this test can be used) for the duration of the COVID-19 declaration under Section 564(b)(1) of the Act, 21 U.S.C. section 360bbb-3(b)(1), unless the authorization is terminated or revoked.  Performed at [facility], [place] [address] [place], [place] [address]   Surgical pcr screen     Status: Abnormal   Collection Time: 03/05/21 11:26 AM   Specimen: Nasal Mucosa; Nasal Swab  Result Value Ref Range Status   MRSA, PCR NEGATIVE NEGATIVE Final   Staphylococcus aureus POSITIVE (A) NEGATIVE Final    Comment: (NOTE) The Xpert SA Assay (FDA approved for NASAL specimens in patients 66 years of age and older), is one component of a comprehensive surveillance program. It is not intended to diagnose infection nor to guide or monitor treatment. Performed at [facility], [place] [address], [place], [place] [address]     Labs: CBC: Recent Labs  Lab 03/08/21 0214 03/09/21 0255 03/10/21 0216 03/11/21 0152 03/12/21 0132  WBC 9.6 14.3* 11.1* 8.9 9.4  HGB 9.9* 11.6* 10.3* 9.2* 10.0*  HCT 30.7* 37.5 32.0* 29.8* 31.6*  MCV 93.0 94.7 91.2 94.3 92.9  PLT 270 353 327 346 295   Basic Metabolic Panel: Recent Labs  Lab 03/07/21 0137 03/08/21 0214 03/09/21 0255 03/12/21 0132  NA 134* 135 135 133*  K 5.1 4.5 4.8 4.7  CL 102 103 99 100  CO2 '22 24 22 23  ' GLUCOSE 250* 229* 224* 222*  BUN 25* 28* 26* 31*  CREATININE 1.69* 1.50* 1.59* 1.26*  CALCIUM 8.7* 8.6* 9.0 8.8*  MG  --   --  1.8 1.8  PHOS  --   --  2.8 3.2   Liver Function Tests: Recent Labs  Lab 03/09/21 0255 03/12/21 0132  ALBUMIN 2.8* 2.7*   CBG: Recent Labs  Lab 03/12/21 1159 03/12/21 1644 03/12/21 2018 03/13/21 0629  03/13/21 1105  GLUCAP 213* 260* 160* 180* 333*    Discharge time spent: greater than 30 minutes.  Signed: Mercy Riding, MD [facility] 03/13/2021

## 2021-03-13 NOTE — Progress Notes (Signed)
Patient ID: [name], female   DOB: 03/10/1940, 81 y.o.   MRN: [id number] Met with the patient to introduce self, rehab process, team conference and plan of care. Discussed current situation, skin care needs and secondary risk management. Reviewed dietary modifications including CMM diet, increased protein and calcium. Patient noted 1 level 1 ste in front vs 5 ste in back; will be using front entry to 1 level home at discharge.  Continue to follow along to discharge to address educational needs. Collaborate with the patient and team to facilitate preparation for smooth discharge home. Margarito Liner

## 2021-03-13 NOTE — H&P (Shared)
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     HPI: [name] is a 81 year old female with history of T2DM, polyneuropathy, breast cancer, CAD s/p CABG, CKD IV, PAD, recent Covid infection 01/2021, epiglottitis s/p steroids and antibiotics (Dr. Janace Hoard) who was admitted on 02/28/21 with BLE weakness with difficulty walking, fall, worsening of neuropathy, transient diplopia and found to have elevated troponin and positive H flu markers felt to be due to recent infection.  Elevated troponin felt to be due to decreased renal function and 2 D echo done revealing EF 55-60% with mild MVR and aortic replacement without leak. She was briefly treated with IV heparin and as patient asymptomatic and cardiology questioned demand ischemia and recommended medical management. MRI brain negative for acute changes. MRI thoracic and lumbar spine  showed post op changes L3-S1 with moderate L4 and L4 foraminal stenosis and L2-L3 mild to moderate canal and left lateral recess stenosis and unremarkable thoracic spine.   Dr. Carlis Abbott consulted for input on non-healing recent left toe great wound and bilateral heel wounds with rest pain. LLE angiogram revealed diffuse disease throughout the leg without options for revascularization and she was agreeable to undergo L-AKA by Dr. Unk Lightning on 03/06/21. Post op had improvement in pain but continued to be limited by weakness with orthostatic changes,  tachycardia--HR up to 130's with ambulation as well as poorly controlled BS with ABLA. CIR recommended due to functional decline.     Review of Systems  Constitutional:  Negative for chills and fever.  HENT:  Positive for hearing loss.   Eyes:  Negative for blurred vision and double vision.  Respiratory:  Negative for cough and shortness of breath.   Cardiovascular:  Negative for chest pain and leg swelling.   Gastrointestinal:  Negative for constipation, heartburn and nausea.  Genitourinary:  Negative for dysuria and urgency.  Musculoskeletal:  Negative for back pain and neck pain.  Neurological:  Positive for sensory change (foot cold/numb> bilateral hands) and weakness. Negative for dizziness and headaches.  Psychiatric/Behavioral:  The patient has insomnia (does not sleep at nights--watches TV till 4 am then gets up a 8 am).     Past Medical History:  Diagnosis Date   Allergy    Anemia    childhood   Anxiety    Aortic stenosis    Arterial stenosis (HCC)    mesenteric   Arthritis    Breast cancer ([place]) 12/05/10   R breast, inv mammary, in situ,ER/PR +,HER2 -   Cancer ([place])    Carcinoma of breast treated with adjuvant chemotherapy ([place])    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[place])    Claudication ([place])    Coronary artery disease    stent 2007   Depression    Diabetes mellitus    Difficulty sleeping    Diverticulosis 12/10/03   Elevated cholesterol    Generalized weakness    GERD (gastroesophageal reflux disease)    Heart murmur    Hepatitis    as an infant   HSV-1 (herpes simplex virus 1) infection    Hyperlipidemia    Hypertension    Hypothyroidism    Osteoarthritis    PAD (peripheral artery disease) ([place])    Personal history of chemotherapy    Personal history of radiation therapy    Pneumonia    2014 september and  March 2022   Rheumatoid arthritis ([place])    S/P aortic valve replacement with bioprosthetic valve 05/04/2020   [facility] Inspiris Resilia stented bovine pericardial tissue valve, size 23 mm   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   LIMA to D3, SVG to PDA, EVH via right thigh   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   Dr [name]   Spinal stenosis    Thyroid disease     Past Surgical History:  Procedure Laterality Date   ABDOMINAL AORTOGRAM W/LOWER EXTREMITY Bilateral 04/29/2019   Procedure: ABDOMINAL AORTOGRAM W/LOWER EXTREMITY;  Surgeon: Marty Heck,  MD;  Location: [facility];  Service: Cardiovascular;  Laterality: Bilateral;   ABDOMINAL AORTOGRAM W/LOWER EXTREMITY Left 07/01/2019   Procedure: ABDOMINAL AORTOGRAM W/ Left LOWER EXTREMITY [place];  Surgeon: Marty Heck, MD;  Location: [facility];  Service: Cardiovascular;  Laterality: Left;   ABDOMINAL AORTOGRAM W/LOWER EXTREMITY Left 03/03/2021   Procedure: ABDOMINAL AORTOGRAM W/LOWER EXTREMITY;  Surgeon: Cherre Robins, MD;  Location: [facility];  Service: Cardiovascular;  Laterality: Left;   AMPUTATION Left 03/06/2021   Procedure: LEFT ABOVE KNEE AMPUTATION;  Surgeon: [name], MD;  Location: [facility];  Service: Vascular;  Laterality: Left;   ANTERIOR AND POSTERIOR REPAIR N/A 04/05/2016   Procedure: ANTERIOR (CYSTOCELE) AND POSTERIOR REPAIR (RECTOCELE);  Surgeon: [name], MD;  Location: [facility];  Service: Gynecology;  Laterality: N/A;   AORTIC VALVE REPLACEMENT N/A 05/04/2020   Procedure: AORTIC VALVE REPLACEMENT (AVR) USING INSPIRIS 23MM RESILIA AORTIC VALVE;  Surgeon: Rexene Alberts, MD;  Location: [facility];  Service: Open Heart Surgery;  Laterality: N/A;   BREAST BIOPSY     BREAST LUMPECTOMY  1983   benign biopsy   BREAST LUMPECTOMY Right 12/29/2010   snbx, ER/PR +, Her2 -, 0/1 node pos.   CARPAL TUNNEL RELEASE Bilateral 9/12,8,12   CHOLECYSTECTOMY     COLONOSCOPY N/A 02/09/2013   Procedure: COLONOSCOPY;  Surgeon: [name], MD;  [facility];  Service: Endoscopy;  Laterality: N/A;   COLONOSCOPY     CORONARY ANGIOPLASTY     CORONARY ARTERY BYPASS GRAFT N/A 05/04/2020   Procedure: CORONARY ARTERY BYPASS GRAFTING (CABG), ON PUMP, TIMES TWO, USING LEFT INTERNAL MAMMARY ARTERY AND RIGHT ENDOSCOPICALLY HARVESTED GREATER SAPHENOUS VEIN;  Surgeon: Rexene Alberts, MD;  Location: [facility];  Service: Open Heart Surgery;  Laterality: N/A;   DILATION AND CURETTAGE OF UTERUS     ESOPHAGOGASTRODUODENOSCOPY N/A 02/09/2013   Procedure:  ESOPHAGOGASTRODUODENOSCOPY (EGD);  Surgeon: [name], MD;  Location: [name];  Service: Endoscopy;  Laterality: N/A;   FOOT SPURS     HYSTEROSCOPY WITH D & C N/A 09/11/2012   Procedure: DILATATION AND CURETTAGE ;  Surgeon: [name], MD;  Location: [facility];  Service: Gynecology;  Laterality: N/A;   KNEE SURGERY  2007   LAPAROSCOPIC ASSISTED VAGINAL HYSTERECTOMY N/A 04/05/2016   Procedure: LAPAROSCOPIC ASSISTED VAGINAL HYSTERECTOMY possible BSO;  Surgeon: [name], MD;  Location: [facility];  Service: Gynecology;  Laterality: N/A;   LUMBAR LAMINECTOMY/DECOMPRESSION MICRODISCECTOMY N/A 10/20/2014   Procedure: MICRO LUMBER DECOMPRESSION L3-4 L4-5;  Surgeon: [name], MD;  [facility];  Service: Orthopedics;  Laterality: N/A;   LUMBAR LAMINECTOMY/DECOMPRESSION MICRODISCECTOMY Bilateral 10/13/2015   Procedure: MICRO LUMBAR DECOMPRESSION L5 - S1 AND REDO DECOMPRESSION L4 - L5 AND REMOVAL OF FACET CYST L4 - L5 2 LEVELS;  Surgeon: [name], MD;  [facility];  Service: Orthopedics;  Laterality:  Bilateral;   PERIPHERAL VASCULAR INTERVENTION Right 04/29/2019   Procedure: PERIPHERAL VASCULAR INTERVENTION;  Surgeon: Marty Heck, MD;  Location: [facility];  Service: Cardiovascular;  Laterality: Right;   PORT-A-CATH REMOVAL  04/17/2011   Procedure: REMOVAL PORT-A-CATH;  Surgeon: [name], MD;  Location: [facility];  Service: General;  Laterality: Left;   PORTACATH PLACEMENT  02/07/2011   Procedure: INSERTION PORT-A-CATH;  Surgeon: [name], MD;  [facility];  Service: General;  Laterality: N/A;   RIGHT/LEFT HEART CATH AND CORONARY ANGIOGRAPHY N/A 03/17/2020   Procedure: RIGHT/LEFT HEART CATH AND CORONARY ANGIOGRAPHY;  Surgeon: Burnell Blanks, MD;  Location: [facility];  Service: Cardiovascular;  Laterality: N/A;   TEE WITHOUT CARDIOVERSION N/A 05/04/2020   Procedure: TRANSESOPHAGEAL ECHOCARDIOGRAM (TEE);   Surgeon: Rexene Alberts, MD;  Location: [facility];  Service: Open Heart Surgery;  Laterality: N/A;   UPPER GASTROINTESTINAL ENDOSCOPY      Family History  Problem Relation Age of Onset   Kidney disease Mother    Heart disease Mother    Throat cancer Father    Alcoholism Father    Heart attack Father    Lymphoma Brother 26   Heart attack Brother    Diabetes Brother    Hypertension Brother    Cancer Son    Colon cancer Neg Hx    Stroke Neg Hx    Esophageal cancer Neg Hx    Rectal cancer Neg Hx    Stomach cancer Neg Hx     Social History: Lives husband. Retired --used to be a Radiation protection practitioner.  She reports that she quit smoking about 23 years ago. Her smoking use included cigarettes. She has a 80.00 pack-year smoking history. She has never used smokeless tobacco. She reports current alcohol use of about 1.0 standard drink per week. She reports that she does not use drugs.   Allergies  Allergen Reactions   Codeine Nausea And Vomiting and Other (See Comments)    Severe stomach cramps, also   Antihistamines, Diphenhydramine-Type Other (See Comments)    Causes hyperactivity   Gabapentin     Urinary incontinence   Statins Other (See Comments)    Joint pains   Sulfa Antibiotics Other (See Comments)    Reaction not recalled   Erythromycin Hives, Rash and Other (See Comments)    Allergic due to dental work from 50 years ago. It was used in packing and resulted in rash/hives inside and outside of mouth.   Trulicity [Dulaglutide] Itching    Medications Prior to Admission  Medication Sig Dispense Refill   Accu-Chek FastClix Lancets MISC USE TO CHECK BLOOD SUGARS 3 TIMES DAILY 306 each 3   acetaminophen (TYLENOL) 500 MG tablet Take 500 mg by mouth every 6 (six) hours as needed for mild pain or headache.     aspirin EC 81 MG tablet Take 81 mg by mouth in the morning.     blood glucose meter kit and supplies Dispense based on patient and insurance preference. Use up to four times daily as  directed. (FOR ICD-10 E10.9, E11.9). 1 each 0   Blood Glucose Monitoring Suppl DEVI by Does not apply route. Accu Check Guide     calcium carbonate (TUMS - DOSED IN MG ELEMENTAL CALCIUM) 500 MG chewable tablet Chew 3 tablets by mouth at bedtime.     Cholecalciferol (VITAMIN D3) 25 MCG (1000 UT) CAPS Take 2,000 Units by mouth daily before lunch.     Evolocumab (REPATHA SURECLICK)  140 MG/ML SOAJ Inject 1 pen into the skin every 14 (fourteen) days. (Patient taking differently: Inject 140 mg into the skin every 14 (fourteen) days.) 2 mL 11   glipiZIDE (GLUCOTROL XL) 10 MG 24 hr tablet Take 1 tablet (10 mg total) by mouth daily with breakfast. 90 tablet 3   glucose blood (ACCU-CHEK GUIDE) test strip TEST ONCE DAILY 100 each 5   glucose blood test strip 1 each by Other route as needed for other. Use as instructed  Please dispense Accu check Guide Test strips     Golimumab (SIMPONI ARIA IV) Inject into the vein every 2 (two) months.     icosapent Ethyl (VASCEPA) 1 g capsule Take 2 capsules (2 g total) by mouth 2 (two) times daily. 120 capsule 11   insulin glargine (LANTUS) 100 UNIT/ML Solostar Pen Inject 10 Units into the skin daily. 15 mL 1   JARDIANCE 10 MG TABS tablet TAKE 1 TABLET(10 MG) BY MOUTH DAILY BEFORE BREAKFAST (Patient taking differently: Take 10 mg by mouth daily.) 30 tablet 2   levothyroxine (SYNTHROID) 100 MCG tablet Take 100 mcg by mouth daily before breakfast.     losartan (COZAAR) 50 MG tablet Take 50 mg by mouth daily.     metoprolol tartrate (LOPRESSOR) 25 MG tablet TAKE 1 TABLET(25 MG) BY MOUTH TWICE DAILY (Patient taking differently: Take 25 mg by mouth 2 (two) times daily.) 180 tablet 3   pantoprazole (PROTONIX) 40 MG tablet Take 1 tablet (40 mg total) by mouth daily before breakfast.     PARoxetine (PAXIL) 20 MG tablet Take 1 tablet (20 mg total) by mouth in the morning. 90 tablet 3   pregabalin (LYRICA) 75 MG capsule Take 1 capsule (75 mg total) by mouth 2 (two) times daily. 180  capsule 3   Semaglutide,0.25 or 0.5MG/DOS, (OZEMPIC, 0.25 OR 0.5 MG/DOSE,) 2 MG/1.5ML SOPN Inject 0.5 mg into the skin once a week. (Patient taking differently: Inject 0.5 mg into the skin every Monday.) 1.5 mL 6   traZODone (DESYREL) 50 MG tablet Take 0.5 tablets (25 mg total) by mouth at bedtime as needed for sleep. 30 tablet 6   valACYclovir (VALTREX) 1000 MG tablet Take 1 tablet (1,000 mg total) by mouth daily as needed (Fever Blisters). 180 tablet 0   colchicine 0.6 MG tablet Take 1 tablet (0.6 mg total) by mouth 2 (two) times daily. (Patient not taking: Reported on 03/01/2021) 60 tablet 0   fluconazole (DIFLUCAN) 150 MG tablet Take 150 mg by mouth once. (Patient not taking: Reported on 03/01/2021)     Magnesium 250 MG TABS Take 250 mg by mouth at bedtime.     nystatin (MYCOSTATIN) 100000 UNIT/ML suspension Take 5 mLs (500,000 Units total) by mouth 4 (four) times daily. Swish and swallow (Patient not taking: Reported on 03/01/2021) 60 mL 0      Home: Home Living Family/patient expects to be discharged to:: Private residence Living Arrangements: Spouse/significant other Available Help at Discharge: Family, Available 24 hours/day Type of Home: House Home Access: Stairs to enter [employer] of Steps: 5 Entrance Stairs-Rails: Right, Left Home Layout: One level Bathroom Shower/Tub: Chiropodist: Handicapped height Bathroom Accessibility: Yes Home Equipment: Conservation officer, nature (2 wheels), Other (comment) (reports this was her mother's walker so may benefit from getting new equipment) Additional Comments: Has access to tub seat if needed   Functional History: Prior Function Prior Level of Function : Independent/Modified Independent, Driving Mobility Comments: Typically mod indep without DME, admits to furniture  surfing around home, relies on shopping cart at stores ADLs Comments: Independent without DME  Functional Status:  Mobility: Bed Mobility Overal bed  mobility: Needs Assistance Bed Mobility: Supine to Sit, Sit to Supine Supine to sit: Min guard, HOB elevated Sit to supine: Min guard, HOB elevated General bed mobility comments: INcreased time and use of rail to get to EOB due to pain. Transfers Overall transfer level: Needs assistance Equipment used: Rolling walker (2 wheels) Transfers: Sit to/from Stand Sit to Stand: Min assist Bed to/from chair/wheelchair/BSC transfer type:: Stand pivot Stand pivot transfers: Min assist  Lateral/Scoot Transfers: Min guard General transfer comment: Min A to power to standing with cues for hand placement. Stood from [employer]. Ambulation/Gait Ambulation/Gait assistance: Min assist Gait Distance (Feet): 16 Feet Assistive device: Rolling walker (2 wheels) Gait Pattern/deviations: Step-to pattern General Gait Details: Hop to pattern with walker with min assist for balance and support. HR up to 136 bpm with activity. Gait velocity: decreased Gait velocity interpretation: <1.31 ft/sec, indicative of household ambulator    ADL: ADL Overall ADL's : Needs assistance/impaired Eating/Feeding: Modified independent Grooming: Set up, Sitting Upper Body Bathing: Set up Lower Body Bathing: Minimal assistance, Sitting/lateral leans Upper Body Dressing : Set up Lower Body Dressing: Minimal assistance, Sitting/lateral leans Toilet Transfer: BSC/3in1, Moderate assistance Toileting- Clothing Manipulation and Hygiene: Minimal assistance Functional mobility during ADLs: Moderate assistance, Cueing for safety General ADL Comments: Ambulated @ 60 ft  Cognition: Cognition Overall Cognitive Status: Within Functional Limits for tasks assessed Orientation Level: Oriented X4 Cognition Arousal/Alertness: Awake/alert Behavior During Therapy: WFL for tasks assessed/performed Overall Cognitive Status: Within Functional Limits for tasks assessed   Blood pressure 118/63, pulse 100, temperature 98.1 F (36.7 C),  temperature source Oral, resp. rate 19, height 5' 3" (1.6 m), weight 66.7 kg, SpO2 98 %. Physical Exam Vitals and nursing note reviewed.  Constitutional:      Appearance: Normal appearance.  Cardiovascular:     Rate and Rhythm: Normal rate.  Pulmonary:     Effort: Pulmonary effort is normal.     Breath sounds: Normal breath sounds.  Abdominal:     General: There is no distension.     Palpations: There is no mass.  Musculoskeletal:     Comments: Dry dressing on L-AKA. Right great toe with callus?plantar wart on inferior aspect.   Neurological:     Mental Status: She is alert and oriented to person, place, and time.    Results for orders placed or performed during the hospital encounter of 02/28/21 (from the past 48 hour(s))  Glucose, capillary     Status: Abnormal   Collection Time: 03/11/21 11:28 AM  Result Value Ref Range   Glucose-Capillary 266 (H) 70 - 99 mg/dL    Comment: Glucose reference range applies only to samples taken after fasting for at least 8 hours.  Glucose, capillary     Status: Abnormal   Collection Time: 03/11/21  4:18 PM  Result Value Ref Range   Glucose-Capillary 211 (H) 70 - 99 mg/dL    Comment: Glucose reference range applies only to samples taken after fasting for at least 8 hours.  Glucose, capillary     Status: Abnormal   Collection Time: 03/11/21  9:23 PM  Result Value Ref Range   Glucose-Capillary 236 (H) 70 - 99 mg/dL    Comment: Glucose reference range applies only to samples taken after fasting for at least 8 hours.  CBC     Status: Abnormal   Collection Time: 03/12/21  1:32 AM  Result Value Ref Range   WBC 9.4 4.0 - 10.5 K/uL   RBC 3.40 (L) 3.87 - 5.11 MIL/uL   Hemoglobin 10.0 (L) 12.0 - 15.0 g/dL   HCT 31.6 (L) 36.0 - 46.0 %   MCV 92.9 80.0 - 100.0 fL   MCH 29.4 26.0 - 34.0 pg   MCHC 31.6 30.0 - 36.0 g/dL   RDW 15.5 11.5 - 15.5 %   Platelets 373 150 - 400 K/uL   nRBC 0.0 0.0 - 0.2 %    Comment: Performed at [facility], [place] [address], [place], [place] [address]  Renal function panel     Status: Abnormal   Collection Time: 03/12/21  1:32 AM  Result Value Ref Range   Sodium 133 (L) 135 - 145 mmol/L   Potassium 4.7 3.5 - 5.1 mmol/L   Chloride 100 98 - 111 mmol/L   CO2 23 22 - 32 mmol/L   Glucose, Bld 222 (H) 70 - 99 mg/dL    Comment: Glucose reference range applies only to samples taken after fasting for at least 8 hours.   BUN 31 (H) 8 - 23 mg/dL   Creatinine, Ser 1.26 (H) 0.44 - 1.00 mg/dL   Calcium 8.8 (L) 8.9 - 10.3 mg/dL   Phosphorus 3.2 2.5 - 4.6 mg/dL   Albumin 2.7 (L) 3.5 - 5.0 g/dL   GFR, Estimated 44 (L) >60 mL/min    Comment: (NOTE) Calculated using the CKD-EPI Creatinine Equation (2021)    Anion gap 10 5 - 15    Comment: Performed at [facility] [address], [place], [place] [address]  Magnesium     Status: None   Collection Time: 03/12/21  1:32 AM  Result Value Ref Range   Magnesium 1.8 1.7 - 2.4 mg/dL    Comment: Performed at [facility] [address], [place], [place] [address]  Glucose, capillary     Status: Abnormal   Collection Time: 03/12/21  6:07 AM  Result Value Ref Range   Glucose-Capillary 191 (H) 70 - 99 mg/dL    Comment: Glucose reference range applies only to samples taken after fasting for at least 8 hours.  Glucose, capillary     Status: Abnormal   Collection Time: 03/12/21  8:22 AM  Result Value Ref Range   Glucose-Capillary 253 (H) 70 - 99 mg/dL    Comment: Glucose reference range applies only to samples taken after fasting for at least 8 hours.  Glucose, capillary     Status: Abnormal   Collection Time: 03/12/21 11:59 AM  Result Value Ref Range   Glucose-Capillary 213 (H) 70 - 99 mg/dL    Comment: Glucose reference range applies only to samples taken after fasting for at least 8 hours.  Glucose, capillary     Status: Abnormal   Collection Time: 03/12/21  4:44 PM  Result Value Ref Range   Glucose-Capillary 260 (H) 70 - 99 mg/dL    Comment: Glucose  reference range applies only to samples taken after fasting for at least 8 hours.  Glucose, capillary     Status: Abnormal   Collection Time: 03/12/21  8:18 PM  Result Value Ref Range   Glucose-Capillary 160 (H) 70 - 99 mg/dL    Comment: Glucose reference range applies only to samples taken after fasting for at least 8 hours.  Glucose, capillary     Status: Abnormal   Collection Time: 03/13/21  6:29 AM  Result Value  Ref Range   Glucose-Capillary 180 (H) 70 - 99 mg/dL    Comment: Glucose reference range applies only to samples taken after fasting for at least 8 hours.   No results found.    Blood pressure 118/63, pulse 100, temperature 98.1 F (36.7 C), temperature source Oral, resp. rate 19, height 5' 3" (1.6 m), weight 66.7 kg, SpO2 98 %.  Medical Problem List and Plan: 1. Functional deficits secondary to ***  -patient may *** shower  -ELOS/Goals: *** 2.  Antithrombotics: -DVT/anticoagulation:  Pharmaceutical: Heparin  -antiplatelet therapy:  3. Pain Management: Hydrocodone prn 4. Mood: LCSW to follow for evaluation and support.   -antipsychotic agents: N/A 5. Neuropsych: This patient is capable of making decisions on her own behalf. 6. Skin/Wound Care: Routine pressure relief measures.   --Add vitamin C and Zinc.  7. Fluids/Electrolytes/Nutrition: Monitor I/O. Check CMET in am.  8. T2DM: Monitor BS ac/hs. Conitnue insulin glargine 26 units BID--discontinue ensure supplements between meals.  --Will add juven and Ensure max instead of Ensure Enlive to help with BS control/wound healing.   --resume ozempic  and glucotrol--> daughter to bring in ozempic. Will likely need meal coverage and insulin glargine decreased as home meds are resumed.   --dicussed adding protein (eating a lot of fruit) 9. HTN: Monitor BP TID 10. CAD s/p CABG: Monitor for symptoms with increase in activity.  --Continue ASA, BB and  11. RA: Managed with infusion every 2 months (Dr. Trudie Reed)  --aware of need  to hold medication to help promote healing.  12.  Depression: Managed with Paxil at current dose.      ***  Bary Leriche, PA-C 03/13/2021

## 2021-03-13 NOTE — Progress Notes (Signed)
Pt arrived to unit, pt alert and oriented, able to make needs known, pt did stand pivot to bsc, had continent void. Educated to rehab

## 2021-03-13 NOTE — Progress Notes (Signed)
Inpatient Rehabilitation Admission Medication Review by a Pharmacist  A complete drug regimen review was completed for this patient to identify any potential clinically significant medication issues.  High Risk Drug Classes Is patient taking? Indication by Medication  Antipsychotic Yes Compazine- N/V  Anticoagulant Yes Lovenox- VTE prophylaxis  Antibiotic No   Opioid Yes Norco, tramadol- acute pain  Antiplatelet Yes Aspirin- CVA prophylaxis  Hypoglycemics/insulin Yes iSS, Semglee- T2DM  Vasoactive Medication No   Chemotherapy No   Other Yes Synthroid- hypothyroidism Protonix- GERD Paxil- depression/anxiety Trazodone- sleep     Type of Medication Issue Identified Description of Issue Recommendation(s)  Drug Interaction(s) (clinically significant)     Duplicate Therapy     Allergy     No Medication Administration End Date     Incorrect Dose     Additional Drug Therapy Needed     Significant med changes from prior encounter (inform family/care partners about these prior to discharge).    Other  PTA meds:  Vit D Evolocumab Glipizide Golimumab Vascepa Lantus Jardiance Losartan Lopressor Pregabalin Semaglutide valtrex Restart PTA meds when and if clinically necessary during CIR admission or at time of discharge    Clinically significant medication issues were identified that warrant physician communication and completion of prescribed/recommended actions by midnight of the next day:  No   Time spent performing this drug regimen review (minutes):  30   Leaman Abe BS, PharmD, BCPS Clinical Pharmacist 03/13/2021 2:35 PM

## 2021-03-13 NOTE — Progress Notes (Signed)
PMR Admission Coordinator Pre-Admission Assessment ° °Patient: [name] is an 80 y.o., female °MRN: [id number] °DOB: 07/15/1940 °Height: 5' 3" (160 cm) °Weight: 66.7 kg ° °Insurance Information °HMO:     PPO: yes     PCP:      IPA:      80/20:      OTHER:  °PRIMARY: [employer] Medicare      Policy#: [id number]      Subscriber: pt °CM Name: Debra      Phone#: [phone]     Fax#: [phone] °Pre-Cert#: [id number] auth for CIR from Debra with updates due to fax listed above on 2/27      Employer:  °Benefits:  Phone #: [phone]     Name:  °Eff. Date: 01/22/21     Deduct: $200  (met)    Out of Pocket Max: $2200       Life Max: n/a °CIR: 100%      SNF: 100 days °Outpatient: 100%     Co-Pay:  °Home Health: 100%      Co-Pay:  °DME: 100%     Co-Pay:  °Providers:  °SECONDARY: n/a      Policy#:      Phone#:  ° °Financial Counselor:       Phone#:  ° °The “Data Collection Information Summary” for patients in Inpatient Rehabilitation Facilities with attached “Privacy Act Statement-Health Care Records” was provided and verbally reviewed with: Patient ° °Emergency Contact Information °Contact Information   ° ° Name Relation Home Work Mobile  ° Perret,Frank Spouse [phone]  [phone]  ° Jones,Cathy Daughter   [phone]  ° °  ° ° °Current Medical History  °Patient Admitting Diagnosis: AKA, NSTEMI, flu ° °History of Present Illness: Pt is a 80 y/o female with PMH of DM, HTN, CAD s/p CABG, recent COVID, who was admitted to [facility] on 2/7 with LE weakness and chest pain.  ED workup revealed elevated troponin and NSTEMI.  No further chest pain or EKG changes to suggest ischemia.  Received heparin drip for 48 hours.  Cardiology consulted, recommended medical management, and signed off.  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  Workup negative for LE weakness.  Therapy evaluations completed and pt was recommended for CIR.   °  ° °Patient's medical record from [facility] has been reviewed by the  rehabilitation admission coordinator and physician. ° °Past Medical History  °Past Medical History:  °Diagnosis Date  ° Allergy   ° Anemia   ° childhood  ° Anxiety   ° Aortic stenosis   ° Arterial stenosis (HCC)   ° mesenteric  ° Arthritis   ° Breast cancer (HCC) 12/05/10  ° R breast, inv mammary, in situ,ER/PR +,HER2 -  ° Cancer (HCC)   ° Carcinoma of breast treated with adjuvant chemotherapy (HCC)   ° 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HCC)   ° Claudication (HCC)   ° Coronary artery disease   ° stent 2007  ° Depression   ° Diabetes mellitus   ° Difficulty sleeping   ° Diverticulosis 12/10/03  ° Elevated cholesterol   ° Generalized weakness   ° GERD (gastroesophageal reflux disease)   ° Heart murmur   ° Hepatitis   ° as an infant  ° HSV-1 (herpes simplex virus 1) infection   ° Hyperlipidemia   ° Hypertension   ° Hypothyroidism   ° Osteoarthritis   ° PAD (peripheral artery disease) (HCC)   ° Personal   history of chemotherapy   ° Personal history of radiation therapy   ° Pneumonia   ° 2014 september and March 2022  ° Rheumatoid arthritis (HCC)   ° S/P aortic valve replacement with bioprosthetic valve 05/04/2020  ° Edwards Inspiris Resilia stented bovine pericardial tissue valve, size 23 mm  ° 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 Yes ° °Family History   °family history includes Alcoholism in her father; Cancer in her son; Diabetes in her brother; Heart attack in her brother and father; Heart disease in her mother; Hypertension in her brother; Kidney disease in her mother; Lymphoma (age of onset: 60) in her brother; Throat cancer in her father. ° °Current Medications ° °Current Facility-Administered Medications:  °  0.9 %  sodium chloride infusion, 250 mL, Intravenous, PRN, Collins, Emma M, PA-C °  0.9 %   sodium chloride infusion, 250 mL, Intravenous, PRN, Collins, Emma M, PA-C °  acetaminophen (TYLENOL) tablet 325-650 mg, 325-650 mg, Oral, Q6H PRN, Collins, Emma M, PA-C °  alum & mag hydroxide-simeth (MAALOX/MYLANTA) 200-200-20 MG/5ML suspension 15-30 mL, 15-30 mL, Oral, Q2H PRN, Collins, Emma M, PA-C °  aspirin EC tablet 81 mg, 81 mg, Oral, Daily, Collins, Emma M, PA-C, 81 mg at 03/10/21 0907 °  bisacodyl (DULCOLAX) suppository 10 mg, 10 mg, Rectal, Daily PRN, Gonfa, Taye T, MD °  calcium carbonate (TUMS - dosed in mg elemental calcium) chewable tablet 200 mg of elemental calcium, 1 tablet, Oral, TID PRN, Collins, Emma M, PA-C, 200 mg of elemental calcium at 03/05/21 2055 °  docusate sodium (COLACE) capsule 100 mg, 100 mg, Oral, BID, Collins, Emma M, PA-C, 100 mg at 03/10/21 0906 °  feeding supplement (ENSURE ENLIVE / ENSURE PLUS) liquid 237 mL, 237 mL, Oral, BID BM, Collins, Emma M, PA-C, 237 mL at 03/10/21 0907 °  guaiFENesin-dextromethorphan (ROBITUSSIN DM) 100-10 MG/5ML syrup 15 mL, 15 mL, Oral, Q4H PRN, Collins, Emma M, PA-C °  heparin injection 5,000 Units, 5,000 Units, Subcutaneous, Q8H, Collins, Emma M, PA-C, 5,000 Units at 03/10/21 0605 °  hydrALAZINE (APRESOLINE) injection 5 mg, 5 mg, Intravenous, Q20 Min PRN, Collins, Emma M, PA-C °  HYDROcodone-acetaminophen (NORCO) 7.5-325 MG per tablet 1-2 tablet, 1-2 tablet, Oral, Q4H PRN, Collins, Emma M, PA-C, 1 tablet at 03/09/21 2049 °  insulin aspart (novoLOG) injection 0-15 Units, 0-15 Units, Subcutaneous, TID WC, Collins, Emma M, PA-C, 11 Units at 03/10/21 1157 °  insulin aspart (novoLOG) injection 0-5 Units, 0-5 Units, Subcutaneous, QHS, Collins, Emma M, PA-C, 5 Units at 03/09/21 2146 °  insulin aspart (novoLOG) injection 6 Units, 6 Units, Subcutaneous, TID WC, Gonfa, Taye T, MD, 6 Units at 03/10/21 1157 °  insulin glargine-yfgn (SEMGLEE) injection 15 Units, 15 Units, Subcutaneous, BID, Gonfa, Taye T, MD, 15 Units at 03/10/21 0907 °  labetalol (NORMODYNE)  injection 10 mg, 10 mg, Intravenous, Q10 min PRN, Collins, Emma M, PA-C, 10 mg at 03/09/21 2341 °  levothyroxine (SYNTHROID) tablet 100 mcg, 100 mcg, Oral, Q0600, Collins, Emma M, PA-C, 100 mcg at 03/10/21 0605 °  magnesium sulfate IVPB 2 g 50 mL, 2 g, Intravenous, Daily PRN, Collins, Emma M, PA-C °  metoprolol tartrate (LOPRESSOR) injection 2-5 mg, 2-5 mg, Intravenous, Q2H PRN, Collins, Emma   M, PA-C °  metoprolol tartrate (LOPRESSOR) tablet 25 mg, 25 mg, Oral, BID, Collins, Emma M, PA-C, 25 mg at 03/10/21 0907 °  morphine (PF) 2 MG/ML injection 0.5-1 mg, 0.5-1 mg, Intravenous, Q2H PRN, Collins, Emma M, PA-C, 1 mg at 03/06/21 1811 °  multivitamin with minerals tablet 1 tablet, 1 tablet, Oral, Daily, Collins, Emma M, PA-C, 1 tablet at 03/10/21 0906 °  mupirocin ointment (BACTROBAN) 2 % 1 application, 1 application, Nasal, BID, Vann, Jessica U, DO, 1 application at 03/10/21 0907 °  ondansetron (ZOFRAN) injection 4 mg, 4 mg, Intravenous, Q6H PRN, Collins, Emma M, PA-C, 4 mg at 03/08/21 0950 °  pantoprazole (PROTONIX) EC tablet 40 mg, 40 mg, Oral, Daily, Collins, Emma M, PA-C, 40 mg at 03/10/21 0907 °  phenol (CHLORASEPTIC) mouth spray 1 spray, 1 spray, Mouth/Throat, PRN, Collins, Emma M, PA-C °  polyethylene glycol (MIRALAX / GLYCOLAX) packet 17 g, 17 g, Oral, BID PRN, Gonfa, Taye T, MD, 17 g at 03/09/21 0921 °  potassium chloride SA (KLOR-CON M) CR tablet 20-40 mEq, 20-40 mEq, Oral, Daily PRN, Collins, Emma M, PA-C °  senna-docusate (Senokot-S) tablet 1 tablet, 1 tablet, Oral, BID PRN, Gonfa, Taye T, MD, 1 tablet at 03/09/21 0920 °  sodium chloride flush (NS) 0.9 % injection 3 mL, 3 mL, Intravenous, Q12H, Collins, Emma M, PA-C, 3 mL at 03/10/21 0907 °  sodium chloride flush (NS) 0.9 % injection 3 mL, 3 mL, Intravenous, PRN, Collins, Emma M, PA-C °  sodium chloride flush (NS) 0.9 % injection 3 mL, 3 mL, Intravenous, Q12H, Collins, Emma M, PA-C, 3 mL at 03/10/21 0907 °  sodium chloride flush (NS) 0.9 % injection 3 mL, 3  mL, Intravenous, PRN, Collins, Emma M, PA-C °  traMADol (ULTRAM) tablet 50 mg, 50 mg, Oral, Q12H, Collins, Emma M, PA-C, 50 mg at 03/10/21 0907 ° °Patients Current Diet:  °Diet Order   ° °       °  Diet heart healthy/carb modified Room service appropriate? Yes; Fluid consistency: Thin  Diet effective now       °  ° °  °  ° °  ° ° °Precautions / Restrictions °Precautions °Precautions: Fall, Other (comment) °Precaution Comments: left AKA °Restrictions °Weight Bearing Restrictions: Yes °LLE Weight Bearing: Non weight bearing  ° °Has the patient had 2 or more falls or a fall with injury in the past year? Yes ° °Prior Activity Level °Limited Community (1-2x/wk): no DME used, furniture surfs at home, uses carts in community, mod I with ADLs ° °Prior Functional Level °Self Care: Did the patient need help bathing, dressing, using the toilet or eating? Independent ° °Indoor Mobility: Did the patient need assistance with walking from room to room (with or without device)? Independent ° °Stairs: Did the patient need assistance with internal or external stairs (with or without device)? Independent ° °Functional Cognition: Did the patient need help planning regular tasks such as shopping or remembering to take medications? Independent ° °Patient Information °Are you of Hispanic, Latino/a,or Spanish origin?: A. No, not of Hispanic, Latino/a, or Spanish origin °What is your race?: A. White °Do you need or want an interpreter to communicate with a doctor or health care staff?: 0. No ° °Patient's Response To:  °Health Literacy and Transportation °Is the patient able to respond to health literacy and transportation needs?: Yes °Health Literacy - How often do you need to have someone help you when you read instructions, pamphlets, or other written material from your   doctor or pharmacy?: Rarely °In the past 12 months, has lack of transportation kept you from medical appointments or from getting medications?: No °In the past 12 months,  has lack of transportation kept you from meetings, work, or from getting things needed for daily living?: No ° °Home Assistive Devices / Equipment °Home Assistive Devices/Equipment: None °Home Equipment: Rolling Walker (2 wheels), Other (comment) (reports this was her mother's walker so may benefit from getting new equipment) ° °Prior Device Use: Indicate devices/aids used by the patient prior to current illness, exacerbation or injury? None of the above ° °Current Functional Level °Cognition ° Overall Cognitive Status: Within Functional Limits for tasks assessed °Orientation Level: Oriented X4 °   °Extremity Assessment °(includes Sensation/Coordination) ° Upper Extremity Assessment: Overall WFL for tasks assessed  °Lower Extremity Assessment: Generalized weakness  °  °ADLs ° Overall ADL's : Needs assistance/impaired °Eating/Feeding: Modified independent °Grooming: Set up, Sitting °Upper Body Bathing: Set up °Lower Body Bathing: Minimal assistance, Sitting/lateral leans °Upper Body Dressing : Set up °Lower Body Dressing: Minimal assistance, Sitting/lateral leans °Toilet Transfer: BSC/3in1, Moderate assistance °Toileting- Clothing Manipulation and Hygiene: Minimal assistance °Functional mobility during ADLs: Moderate assistance, Cueing for safety °General ADL Comments: Ambulated @ 60 ft  °  °Mobility ° Overal bed mobility: Needs Assistance °Bed Mobility: Supine to Sit, Sit to Supine °Supine to sit: Min guard, HOB elevated °Sit to supine: Min guard, HOB elevated °General bed mobility comments: INcreased time and use of rail to get to EOB due to pain.  °  °Transfers ° Overall transfer level: Needs assistance °Equipment used: Rolling walker (2 wheels) °Transfers: Sit to/from Stand °Sit to Stand: Min assist °Bed to/from chair/wheelchair/BSC transfer type:: Stand pivot °Stand pivot transfers: Min assist ° Lateral/Scoot Transfers: Min guard °General transfer comment: Min A to power to standing with cues for hand placement.  Stood from EOB x2.  °  °Ambulation / Gait / Stairs / Wheelchair Mobility ° Ambulation/Gait °Ambulation/Gait assistance: Min assist °Gait Distance (Feet): 16 Feet °Assistive device: Rolling walker (2 wheels) °Gait Pattern/deviations: Step-to pattern °General Gait Details: Hop to pattern with walker with min assist for balance and support. HR up to 136 bpm with activity. °Gait velocity: decreased °Gait velocity interpretation: <1.31 ft/sec, indicative of household ambulator  °  °Posture / Balance Dynamic Sitting Balance °Sitting balance - Comments: Dynamic sitting balance improving °Balance °Overall balance assessment: Needs assistance °Sitting-balance support: Feet supported, No upper extremity supported °Sitting balance-Leahy Scale: Fair °Sitting balance - Comments: Dynamic sitting balance improving °Postural control: Posterior lean °Standing balance support: During functional activity °Standing balance-Leahy Scale: Poor °Standing balance comment: Requiers UE support in standing.  °  °Special needs/care consideration Skin L AKA incision and Diabetic management yes  ° °Previous Home Environment (from acute therapy documentation) °Living Arrangements: Spouse/significant other °Available Help at Discharge: Family, Available 24 hours/day °Type of Home: House °Home Layout: One level °Home Access: Stairs to enter °Entrance Stairs-Rails: Right, Left °Entrance Stairs-Number of Steps: 5 °Bathroom Shower/Tub: Tub/shower unit °Bathroom Toilet: Handicapped height °Bathroom Accessibility: Yes °Home Care Services: No °Additional Comments: Has access to tub seat if needed ° °Discharge Living Setting °Plans for Discharge Living Setting: Patient's home, Lives with (comment) (spouse) °Type of Home at Discharge: House °Discharge Home Layout: One level °Discharge Home Access: Stairs to enter °Entrance Stairs-Rails: Right, Left °Entrance Stairs-Number of Steps: 5 °Discharge Bathroom Shower/Tub: Tub/shower unit °Discharge Bathroom  Toilet: Handicapped height °Discharge Bathroom Accessibility: Yes °How Accessible: Accessible via walker °Does the patient have any problems obtaining   your medications?: No ° °Social/Family/Support Systems °Anticipated Caregiver: Cathy (daughter) is primary contact °Anticipated Caregiver's Contact Information: [phone] °Ability/Limitations of Caregiver: n/a °Caregiver Availability: 24/7 °Discharge Plan Discussed with Primary Caregiver: Yes °Is Caregiver In Agreement with Plan?: Yes °Does Caregiver/Family have Issues with Lodging/Transportation while Pt is in Rehab?: No ° °Goals °Patient/Family Goal for Rehab: PT/OT supervision to mod I w/c level, supervision ambulation; SLP n/a °Expected length of stay: 10-12 days °Pt/Family Agrees to Admission and willing to participate: Yes °Program Orientation Provided & Reviewed with Pt/Caregiver Including Roles  & Responsibilities: Yes ° Barriers to Discharge: Insurance for SNF coverage ° °Decrease burden of Care through IP rehab admission: na ° °Possible need for SNF placement upon discharge: not anticipated ° °Patient Condition: I have reviewed medical records from [facility], spoken with  TOC team , and patient and daughter. I discussed via phone for inpatient rehabilitation assessment.  Patient will benefit from ongoing PT and OT, can actively participate in 3 hours of therapy a day 5 days of the week, and can make measurable gains during the admission.  Patient will also benefit from the coordinated team approach during an Inpatient Acute Rehabilitation admission.  The patient will receive intensive therapy as well as Rehabilitation physician, nursing, social worker, and care management interventions.  Due to safety, skin/wound care, disease management, medication administration, pain management, and patient education the patient requires 24 hour a day rehabilitation nursing.  The patient is currently min assist with mobility and basic ADLs.  Discharge setting and  therapy post discharge at home with home health is anticipated.  Patient has agreed to participate in the Acute Inpatient Rehabilitation Program and will admit today. °  °Preadmission Screen Completed By:  Kennedy Brines, PT, DPT and Osric Klopf E Arieon Scalzo, 03/10/2021 2:21 PM °______________________________________________________________________   °Discussed status with Dr. Raulkar on 03/13/21  at 12:07 PM and received approval for admission today. ° °Admission Coordinator:  Wise Fees E Baltazar Pekala, PT, time 12:07 PM /Date 03/13/21   ° °Assessment/Plan: °Diagnosis: Debility °Does the need for close, 24 hr/day Medical supervision in concert with the patient's rehab needs make it unreasonable for this patient to be served in a less intensive setting? Yes °Co-Morbidities requiring supervision/potential complications: PAD with bilateral claudication, hypothyroidism, type 2 DM, essential HTN, chronic GERD °Due to bladder management, bowel management, safety, skin/wound care, disease management, medication administration, pain management, and patient education, does the patient require 24 hr/day rehab nursing? Yes °Does the patient require coordinated care of a physician, rehab nurse, PT, OT to address physical and functional deficits in the context of the above medical diagnosis(es)? Yes °Addressing deficits in the following areas: balance, endurance, locomotion, strength, transferring, bowel/bladder control, bathing, dressing, feeding, grooming, toileting, and psychosocial support °Can the patient actively participate in an intensive therapy program of at least 3 hrs of therapy 5 days a week? Yes °The potential for patient to make measurable gains while on inpatient rehab is excellent °Anticipated functional outcomes upon discharge from inpatient rehab: modified independent PT, modified independent OT, independent SLP °Estimated rehab length of stay to reach the above functional goals is: 10-12 days °Anticipated discharge  destination: Home °10. Overall Rehab/Functional Prognosis: excellent ° ° °MD Signature: °Krutika Raulkar, MD °  potential for patient to make measurable gains while on inpatient rehab is excellent Anticipated functional outcomes upon discharge from inpatient rehab: modified independent PT, modified independent OT, independent  SLP Estimated rehab length of stay to reach the above functional goals is: 10-12 days Anticipated discharge destination: Home 10. Overall Rehab/Functional Prognosis: excellent     MD Signature: Leeroy Cha, MD

## 2021-03-13 NOTE — H&P (Signed)
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     HPI: Everlean Bucher is a 81 year old female with history of T2DM, polyneuropathy, breast cancer, CAD s/p CABG, CKD IV, PAD, recent Covid infection 01/2021, epiglottitis s/p steroids and antibiotics (Dr. Janace Hoard) who was admitted on 02/28/21 with BLE weakness with difficulty walking, fall, worsening of neuropathy, transient diplopia and found to have elevated troponin and positive H flu markers felt to be due to recent infection.  Elevated troponin felt to be due to decreased renal function and 2 D echo done revealing EF 55-60% with mild MVR and aortic replacement without leak. She was briefly treated with IV heparin and as patient asymptomatic and cardiology questioned demand ischemia and recommended medical management. MRI brain negative for acute changes. MRI thoracic and lumbar spine  showed post op changes L3-S1 with moderate L4 and L4 foraminal stenosis and L2-L3 mild to moderate canal and left lateral recess stenosis and unremarkable thoracic spine.   Dr. Carlis Abbott consulted for input on non-healing recent left toe great wound and bilateral heel wounds with rest pain. LLE angiogram revealed diffuse disease throughout the leg without options for revascularization and she was agreeable to undergo L-AKA by Dr. Unk Lightning on 03/06/21. Post op had improvement in pain but continued to be limited by weakness with orthostatic changes,  tachycardia--HR up to 130's with ambulation as well as poorly controlled BS with ABLA. CIR recommended due to functional decline. Currently complains of weakness.   Review of Systems  Constitutional:  Negative for chills and fever.  HENT:  Positive for hearing loss.   Eyes:  Negative for blurred vision and double vision.  Respiratory:  Negative for cough and shortness of breath.   Cardiovascular:  Negative for chest  pain and leg swelling.  Gastrointestinal:  Negative for constipation, heartburn and nausea.  Genitourinary:  Negative for dysuria and urgency.  Musculoskeletal:  Negative for back pain and neck pain.  Neurological:  Positive for sensory change (foot cold/numb> bilateral hands) and weakness. Negative for dizziness and headaches.  Psychiatric/Behavioral:  The patient has insomnia (does not sleep at nights--watches TV till 4 am then gets up a 8 am).     Past Medical History:  Diagnosis Date   Allergy    Anemia    childhood   Anxiety    Aortic stenosis    Arterial stenosis (HCC)    mesenteric   Arthritis    Breast cancer ([place]) 12/05/10   R breast, inv mammary, in situ,ER/PR +,HER2 -   Cancer ([place])    Carcinoma of breast treated with adjuvant chemotherapy ([place])    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[place])    Claudication ([place])    Coronary artery disease    stent 2007   Depression    Diabetes mellitus    Difficulty sleeping    Diverticulosis 12/10/03   Elevated cholesterol    Generalized weakness    GERD (gastroesophageal reflux disease)    Heart murmur    Hepatitis    as an infant   HSV-1 (herpes simplex virus 1) infection    Hyperlipidemia    Hypertension    Hypothyroidism    Osteoarthritis    PAD (peripheral artery disease) ([place])    Personal history of chemotherapy    Personal history of radiation therapy    Pneumonia    2014 september  and March 2022   Rheumatoid arthritis ([place])    S/P aortic valve replacement with bioprosthetic valve 05/04/2020   [facility] Inspiris Resilia stented bovine pericardial tissue valve, size 23 mm   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   LIMA to D3, SVG to PDA, EVH via right thigh   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   Dr [name]   Spinal stenosis    Thyroid disease     Past Surgical History:  Procedure Laterality Date   ABDOMINAL AORTOGRAM W/LOWER EXTREMITY Bilateral 04/29/2019   Procedure: ABDOMINAL AORTOGRAM W/LOWER EXTREMITY;  Surgeon:  Marty Heck, MD;  Location: [facility];  Service: Cardiovascular;  Laterality: Bilateral;   ABDOMINAL AORTOGRAM W/LOWER EXTREMITY Left 07/01/2019   Procedure: ABDOMINAL AORTOGRAM W/ Left LOWER EXTREMITY [place];  Surgeon: Marty Heck, MD;  Location: [facility];  Service: Cardiovascular;  Laterality: Left;   ABDOMINAL AORTOGRAM W/LOWER EXTREMITY Left 03/03/2021   Procedure: ABDOMINAL AORTOGRAM W/LOWER EXTREMITY;  Surgeon: Cherre Robins, MD;  Location: [facility];  Service: Cardiovascular;  Laterality: Left;   AMPUTATION Left 03/06/2021   Procedure: LEFT ABOVE KNEE AMPUTATION;  Surgeon: [name], MD;  Location: [facility];  Service: Vascular;  Laterality: Left;   ANTERIOR AND POSTERIOR REPAIR N/A 04/05/2016   Procedure: ANTERIOR (CYSTOCELE) AND POSTERIOR REPAIR (RECTOCELE);  Surgeon: [name], MD;  Location: [facility];  Service: Gynecology;  Laterality: N/A;   AORTIC VALVE REPLACEMENT N/A 05/04/2020   Procedure: AORTIC VALVE REPLACEMENT (AVR) USING INSPIRIS 23MM RESILIA AORTIC VALVE;  Surgeon: Rexene Alberts, MD;  Location: [facility];  Service: Open Heart Surgery;  Laterality: N/A;   BREAST BIOPSY     BREAST LUMPECTOMY  1983   benign biopsy   BREAST LUMPECTOMY Right 12/29/2010   snbx, ER/PR +, Her2 -, 0/1 node pos.   CARPAL TUNNEL RELEASE Bilateral 9/12,8,12   CHOLECYSTECTOMY     COLONOSCOPY N/A 02/09/2013   Procedure: COLONOSCOPY;  Surgeon: [name], MD;  Location: [facility];  Service: Endoscopy;  Laterality: N/A;   COLONOSCOPY     CORONARY ANGIOPLASTY     CORONARY ARTERY BYPASS GRAFT N/A 05/04/2020   Procedure: CORONARY ARTERY BYPASS GRAFTING (CABG), ON PUMP, TIMES TWO, USING LEFT INTERNAL MAMMARY ARTERY AND RIGHT ENDOSCOPICALLY HARVESTED GREATER SAPHENOUS VEIN;  Surgeon: Rexene Alberts, MD;  Location: [facility];  Service: Open Heart Surgery;  Laterality: N/A;   DILATION AND CURETTAGE OF UTERUS     ESOPHAGOGASTRODUODENOSCOPY N/A 02/09/2013    Procedure: ESOPHAGOGASTRODUODENOSCOPY (EGD);  Surgeon: [name], MD;  Location: [name];  Service: Endoscopy;  Laterality: N/A;   FOOT SPURS     HYSTEROSCOPY WITH D & C N/A 09/11/2012   Procedure: DILATATION AND CURETTAGE ;  Surgeon: [name], MD;  Location: [facility];  Service: Gynecology;  Laterality: N/A;   KNEE SURGERY  2007   LAPAROSCOPIC ASSISTED VAGINAL HYSTERECTOMY N/A 04/05/2016   Procedure: LAPAROSCOPIC ASSISTED VAGINAL HYSTERECTOMY possible BSO;  Surgeon: [name], MD;  Location: [facility];  Service: Gynecology;  Laterality: N/A;   LUMBAR LAMINECTOMY/DECOMPRESSION MICRODISCECTOMY N/A 10/20/2014   Procedure: MICRO LUMBER DECOMPRESSION L3-4 L4-5;  Surgeon: [name], MD;  Location: [facility];  Service: Orthopedics;  Laterality: N/A;   LUMBAR LAMINECTOMY/DECOMPRESSION MICRODISCECTOMY Bilateral 10/13/2015   Procedure: MICRO LUMBAR DECOMPRESSION L5 - S1 AND REDO DECOMPRESSION L4 - L5 AND REMOVAL OF FACET CYST L4 - L5 2 LEVELS;  Surgeon: [name], MD;  Location: [facility];  Service: Orthopedics;  Laterality: Bilateral;   PERIPHERAL VASCULAR INTERVENTION Right 04/29/2019   Procedure: PERIPHERAL VASCULAR INTERVENTION;  Surgeon: Marty Heck, MD;  Location: [facility];  Service: Cardiovascular;  Laterality: Right;   PORT-A-CATH REMOVAL  04/17/2011   Procedure: REMOVAL PORT-A-CATH;  Surgeon: [name], MD;  Location: [facility];  Service: General;  Laterality: Left;   PORTACATH PLACEMENT  02/07/2011   Procedure: INSERTION PORT-A-CATH;  Surgeon: [name], MD;  [facility];  Service: General;  Laterality: N/A;   RIGHT/LEFT HEART CATH AND CORONARY ANGIOGRAPHY N/A 03/17/2020   Procedure: RIGHT/LEFT HEART CATH AND CORONARY ANGIOGRAPHY;  Surgeon: Burnell Blanks, MD;  Location: [facility];  Service: Cardiovascular;  Laterality: N/A;   TEE WITHOUT CARDIOVERSION N/A 05/04/2020   Procedure: TRANSESOPHAGEAL ECHOCARDIOGRAM  (TEE);  Surgeon: Rexene Alberts, MD;  Location: [facility];  Service: Open Heart Surgery;  Laterality: N/A;   UPPER GASTROINTESTINAL ENDOSCOPY      Family History  Problem Relation Age of Onset   Kidney disease Mother    Heart disease Mother    Throat cancer Father    Alcoholism Father    Heart attack Father    Lymphoma Brother 77   Heart attack Brother    Diabetes Brother    Hypertension Brother    Cancer Son    Colon cancer Neg Hx    Stroke Neg Hx    Esophageal cancer Neg Hx    Rectal cancer Neg Hx    Stomach cancer Neg Hx     Social History: Lives husband. Retired --used to be a Radiation protection practitioner.  She reports that she quit smoking about 23 years ago. Her smoking use included cigarettes. She has a 80.00 pack-year smoking history. She has never used smokeless tobacco. She reports current alcohol use of about 1.0 standard drink per week. She reports that she does not use drugs.   Allergies  Allergen Reactions   Codeine Nausea And Vomiting and Other (See Comments)    Severe stomach cramps, also   Antihistamines, Diphenhydramine-Type Other (See Comments)    Causes hyperactivity   Gabapentin     Urinary incontinence   Lyrica [Pregabalin]     Double vision/falls   Oxycodone     Hallucinations/crazy feelings   Statins Other (See Comments)    Joint pains   Sulfa Antibiotics Other (See Comments)    Reaction not recalled   Erythromycin Hives, Rash and Other (See Comments)    Allergic due to dental work from 50 years ago. It was used in packing and resulted in rash/hives inside and outside of mouth.   Trulicity [Dulaglutide] Itching    Medications Prior to Admission  Medication Sig Dispense Refill   Accu-Chek FastClix Lancets MISC USE TO CHECK BLOOD SUGARS 3 TIMES DAILY 306 each 3   acetaminophen (TYLENOL) 500 MG tablet Take 500 mg by mouth every 6 (six) hours as needed for mild pain or headache.     aspirin EC 81 MG tablet Take 81 mg by mouth in the morning.     blood glucose meter  kit and supplies Dispense based on patient and insurance preference. Use up to four times daily as directed. (FOR ICD-10 E10.9, E11.9). 1 each 0   Blood Glucose Monitoring Suppl DEVI by Does not apply route. Accu Check Guide     calcium carbonate (TUMS - DOSED IN MG ELEMENTAL CALCIUM) 500 MG chewable tablet Chew 3 tablets by mouth at bedtime.     Cholecalciferol (VITAMIN D3)  25 MCG (1000 UT) CAPS Take 2,000 Units by mouth daily before lunch.     Evolocumab (REPATHA SURECLICK) 254 MG/ML SOAJ Inject 1 pen into the skin every 14 (fourteen) days. (Patient taking differently: Inject 140 mg into the skin every 14 (fourteen) days.) 2 mL 11   feeding supplement (ENSURE ENLIVE / ENSURE PLUS) LIQD Take 237 mLs by mouth 2 (two) times daily between meals. 237 mL 12   fluconazole (DIFLUCAN) 150 MG tablet Take 150 mg by mouth once. (Patient not taking: Reported on 03/01/2021)     glipiZIDE (GLUCOTROL XL) 10 MG 24 hr tablet Take 1 tablet (10 mg total) by mouth daily with breakfast. 90 tablet 3   glucose blood (ACCU-CHEK GUIDE) test strip TEST ONCE DAILY 100 each 5   glucose blood test strip 1 each by Other route as needed for other. Use as instructed  Please dispense Accu check Guide Test strips     Golimumab (SIMPONI ARIA IV) Inject into the vein every 2 (two) months.     HYDROcodone-acetaminophen (NORCO) 7.5-325 MG tablet Take 1 tablet by mouth every 6 (six) hours as needed for up to 5 days for severe pain or moderate pain. 20 tablet 0   icosapent Ethyl (VASCEPA) 1 g capsule Take 2 capsules (2 g total) by mouth 2 (two) times daily. 120 capsule 11   insulin aspart (NOVOLOG) 100 UNIT/ML injection Inject 0-15 Units into the skin 3 (three) times daily with meals. CBG < 70: Implement Hypoglycemia Standing Orders and refer to Hypoglycemia Standing Orders sidebar report CBG 70 - 120: 0 units CBG 121 - 150: 2 units CBG 151 - 200: 3 units CBG 201 - 250: 5 units CBG 251 - 300: 8 units CBG 301 - 350: 11 units CBG 351 -  400: 15 units CBG > 400: call MD and obtain STAT lab verification 10 mL 11   insulin glargine (LANTUS) 100 UNIT/ML Solostar Pen Inject 20 Units into the skin daily. 15 mL 1   JARDIANCE 10 MG TABS tablet TAKE 1 TABLET(10 MG) BY MOUTH DAILY BEFORE BREAKFAST (Patient taking differently: Take 10 mg by mouth daily.) 30 tablet 2   levothyroxine (SYNTHROID) 100 MCG tablet Take 100 mcg by mouth daily before breakfast.     [START ON 03/16/2021] losartan (COZAAR) 50 MG tablet Take 1 tablet (50 mg total) by mouth daily.     metoprolol tartrate (LOPRESSOR) 50 MG tablet Take 1 tablet (50 mg total) by mouth 2 (two) times daily.     [START ON 03/14/2021] Multiple Vitamin (MULTIVITAMIN WITH MINERALS) TABS tablet Take 1 tablet by mouth daily.     pantoprazole (PROTONIX) 40 MG tablet Take 1 tablet (40 mg total) by mouth daily before breakfast.     PARoxetine (PAXIL) 20 MG tablet Take 1 tablet (20 mg total) by mouth in the morning. 90 tablet 3   pregabalin (LYRICA) 75 MG capsule Take 1 capsule (75 mg total) by mouth 2 (two) times daily. 180 capsule 3   Semaglutide,0.25 or 0.5MG/DOS, (OZEMPIC, 0.25 OR 0.5 MG/DOSE,) 2 MG/1.5ML SOPN Inject 0.5 mg into the skin once a week. (Patient taking differently: Inject 0.5 mg into the skin every Monday.) 1.5 mL 6   senna-docusate (SENOKOT-S) 8.6-50 MG tablet Take 1 tablet by mouth 2 (two) times daily as needed for moderate constipation.     traZODone (DESYREL) 50 MG tablet Take 0.5 tablets (25 mg total) by mouth at bedtime as needed for sleep. 30 tablet 6   valACYclovir (VALTREX) 1000  MG tablet Take 1 tablet (1,000 mg total) by mouth daily as needed (Fever Blisters). 180 tablet 0     Blood pressure (!) 143/64, pulse 86, temperature 97.7 F (36.5 C), resp. rate 17, SpO2 98 %. Physical Exam Vitals and nursing note reviewed.  Constitutional:      Appearance: Normal appearance.  Cardiovascular:     Rate and Rhythm: Normal rate.  Pulmonary:     Effort: Pulmonary effort is  normal.     Breath sounds: Normal breath sounds.  Abdominal:     General: There is no distension.     Palpations: There is no mass.  Musculoskeletal:     Comments: Dry dressing on L-AKA. Right great toe with callus?plantar wart on inferior aspect.   Neurological:     Mental Status: She is alert and oriented to person, place, and time. Moving extremities well without focal deficit Psych: Normal affect. Very pleasant and excited for rehab  Results for orders placed or performed during the hospital encounter of 03/13/21 (from the past 48 hour(s))  Glucose, capillary     Status: None   Collection Time: 03/13/21  5:35 PM  Result Value Ref Range   Glucose-Capillary 92 70 - 99 mg/dL    Comment: Glucose reference range applies only to samples taken after fasting for at least 8 hours.   No results found.    Blood pressure (!) 143/64, pulse 86, temperature 97.7 F (36.5 C), resp. rate 17, SpO2 98 %.  Medical Problem List and Plan: 1. 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  Impaired mobility -DVT/anticoagulation:  Pharmaceutical: Heparin  -antiplatelet therapy: continue aspirin 13m.  3. Postoperative pain: Hydrocodone prn. Continue vitamin C 2534mBID.  4. Mood: LCSW to follow for evaluation and support.   -antipsychotic agents: N/A 5. Neuropsych: This patient is capable of making decisions on her own behalf. 6. Skin/Wound Care: Routine pressure relief measures.   --Add vitamin C and Zinc.  7. Fluids/Electrolytes/Nutrition: Monitor I/O. Check CMET in am.  8. T2DM: Monitor BS ac/hs. Conitnue insulin glargine 26 units BID--discontinue ensure supplements between meals.  --Will add juven and Ensure max instead of Ensure Enlive to help with BS control/wound healing.   --resume ozempic  and glucotrol--> daughter to bring in ozempic. Will likely need meal coverage and insulin glargine  decreased as home meds are resumed.   --dicussed adding protein (eating a lot of fruit) 9. HTN: Monitor BP TID 10. CAD s/p CABG: Monitor for symptoms with increase in activity.  --Continue ASA, BB and  11. RA: Managed with infusion every 2 months (Dr. HaTrudie Reed --aware of need to hold medication to help promote healing.  12.  Depression: Managed with Paxil at current dose.  13. Constipation: continue colace 10048mID  I have personally performed a face to face diagnostic evaluation, including, but not limited to relevant history and physical exam findings, of this patient and developed relevant assessment and plan.  Additionally, I have reviewed and concur with the physician assistant's documentation above.  PamBary LericheA-C   KruIzora RibasD 03/13/2021

## 2021-03-13 NOTE — Progress Notes (Signed)
Physical Therapy Treatment Patient Details Name: [name] MRN: [id number] DOB: [birth year] Today's Date: 03/13/2021   History of Present Illness Pt is a 81 y.o. female admitted for  elevated troponin and leg weakness. Found to have AKI and NSTEMI. 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 PMH includes CAD (s/p CABG, AVR), DM2, breast CA, CKD IV, HTN, OA, PAD, RA, stenosis, neuropathy, Covid.    PT Comments    Pt making excellent progress towards her physical therapy goals; remains motivated to participate. Reports left lower extremity phantom pain; reinforced gentle desensitization techniques I.e. rubbing and tapping. Pt performing standing therapeutic exercises for strengthening and ROM and hopping x 15 ft with a walker at a min assist level. Will benefit from AIR for additional strengthening, transfer/gait training, education and wheelchair mobility.     Recommendations for follow up therapy are one component of a multi-disciplinary discharge planning process, led by the attending physician.  Recommendations may be updated based on patient status, additional functional criteria and insurance authorization.  Follow Up Recommendations  Acute inpatient rehab (3hours/day)     Assistance Recommended at Discharge Intermittent Supervision/Assistance  Patient can return home with the following A little help with walking and/or transfers;Help with stairs or ramp for entrance   Equipment Recommendations  Wheelchair (measurements PT);Wheelchair cushion (measurements PT);BSC/3in1    Recommendations for Other Services       Precautions / Restrictions Precautions Precautions: Fall;Other (comment) Precaution Comments: left AKA Restrictions Weight Bearing Restrictions: Yes LLE Weight Bearing: Non weight bearing     Mobility  Bed Mobility Overal bed mobility: Needs Assistance Bed Mobility: Sit to Supine       Sit to supine: Min guard   General bed mobility comments: Increased time, pt using arms to  manuever L residual limb back into bed    Transfers Overall transfer level: Needs assistance Equipment used: Rolling walker (2 wheels) Transfers: Sit to/from Stand Sit to Stand: Min assist           General transfer comment: Pt performed 3 sit to stands during session from recliner, cues for hand placement, increased time to power up to stand. Light minA provided to boost and initially steady    Ambulation/Gait Ambulation/Gait assistance: Min assist Gait Distance (Feet): 15 Feet Assistive device: Rolling walker (2 wheels) Gait Pattern/deviations: Step-to pattern Gait velocity: decreased Gait velocity interpretation: <1.8 ft/sec, indicate of risk for recurrent falls   General Gait Details: Hop to pattern, heavy reliance through arms on walker, minA for balance. Fatigues easily   Marine scientist Rankin (Stroke Patients Only)       Balance Overall balance assessment: Needs assistance Sitting-balance support: Feet supported, No upper extremity supported Sitting balance-Leahy Scale: Fair     Standing balance support: Bilateral upper extremity supported Standing balance-Leahy Scale: Poor Standing balance comment: Requiers UE support in standing.                            Cognition Arousal/Alertness: Awake/alert Behavior During Therapy: WFL for tasks assessed/performed Overall Cognitive Status: Within Functional Limits for tasks assessed                                          Exercises General Exercises - Lower Extremity Heel Raises: Right, 10 reps, Standing  Mini-Sqauts: Right, 10 reps, Standing Amputee Exercises Gluteal Sets: Both, 10 reps, Standing Hip Extension: AROM, Left, 15 reps, Seated, Standing, Other (comment) (isometric sitting, AROM standing) Hip ABduction/ADduction: AROM, Left, Standing, 10 reps Hip Flexion/Marching: AROM, Left, 10 reps, Standing    General Comments         Pertinent Vitals/Pain Pain Assessment Pain Assessment: Faces Faces Pain Scale: Hurts little more Pain Location: LLE phantom pain Pain Descriptors / Indicators: Sore, Burning Pain Intervention(s): Monitored during session, Limited activity within patient's tolerance    Home Living                          Prior Function            PT Goals (current goals can now be found in the care plan section) Acute Rehab PT Goals Patient Stated Goal: To go to rehab and return to independence Potential to Achieve Goals: Good Progress towards PT goals: Progressing toward goals    Frequency    Min 3X/week      PT Plan Current plan remains appropriate    Co-evaluation              AM-PAC PT "6 Clicks" Mobility   Outcome Measure  Help needed turning from your back to your side while in a flat bed without using bedrails?: None Help needed moving from lying on your back to sitting on the side of a flat bed without using bedrails?: A Little Help needed moving to and from a bed to a chair (including a wheelchair)?: A Little Help needed standing up from a chair using your arms (e.g., wheelchair or bedside chair)?: A Little Help needed to walk in hospital room?: A Little Help needed climbing 3-5 steps with a railing? : Total 6 Click Score: 17    End of Session Equipment Utilized During Treatment: Gait belt Activity Tolerance: Patient tolerated treatment well Patient left: in bed;with call bell/phone within reach Nurse Communication: Mobility status PT Visit Diagnosis: Other abnormalities of gait and mobility (R26.89);Pain;Unsteadiness on feet (R26.81) Pain - Right/Left: Left Pain - part of body: Leg     Time: 3220-2542 PT Time Calculation (min) (ACUTE ONLY): 26 min  Charges:  $Therapeutic Exercise: 8-22 mins $Therapeutic Activity: 8-22 mins                     Wyona Almas, PT, DPT Acute Rehabilitation Services Pager [phone] Office  [phone]    [name] 03/13/2021, 10:33 AM

## 2021-03-13 NOTE — Assessment & Plan Note (Signed)
Recent Labs    03/03/21 1739 03/04/21 0134 03/05/21 0150 03/06/21 0140 03/07/21 0137 03/08/21 0214 03/09/21 0255 03/10/21 0216 03/11/21 0152 03/12/21 0132  HGB 12.1 13.1 10.6* 10.7* 10.2* 9.9* 11.6* 10.3* 9.2* 10.0*  H&H relatively stable. -Recheck CBC in 1 week

## 2021-03-14 LAB — COMPREHENSIVE METABOLIC PANEL
ALT: 15 U/L (ref 0–44)
AST: 21 U/L (ref 15–41)
Albumin: 2.9 g/dL — ABNORMAL LOW (ref 3.5–5.0)
Alkaline Phosphatase: 65 U/L (ref 38–126)
Anion gap: 9 (ref 5–15)
BUN: 26 mg/dL — ABNORMAL HIGH (ref 8–23)
CO2: 26 mmol/L (ref 22–32)
Calcium: 9.2 mg/dL (ref 8.9–10.3)
Chloride: 101 mmol/L (ref 98–111)
Creatinine, Ser: 1.44 mg/dL — ABNORMAL HIGH (ref 0.44–1.00)
GFR, Estimated: 37 mL/min — ABNORMAL LOW (ref >60)
Glucose, Bld: 138 mg/dL — ABNORMAL HIGH (ref 70–99)
Potassium: 5.3 mmol/L — ABNORMAL HIGH (ref 3.5–5.1)
Sodium: 136 mmol/L (ref 135–145)
Total Bilirubin: 0.3 mg/dL (ref 0.3–1.2)
Total Protein: 6.8 g/dL (ref 6.5–8.1)

## 2021-03-14 LAB — CBC WITH DIFFERENTIAL/PLATELET
Abs Immature Granulocytes: 0.17 10*3/uL — ABNORMAL HIGH (ref 0.00–0.07)
Basophils Absolute: 0.1 10*3/uL (ref 0.0–0.1)
Basophils Relative: 1 %
Eosinophils Absolute: 0.5 10*3/uL (ref 0.0–0.5)
Eosinophils Relative: 4 %
HCT: 32.9 % — ABNORMAL LOW (ref 36.0–46.0)
Hemoglobin: 10.9 g/dL — ABNORMAL LOW (ref 12.0–15.0)
Immature Granulocytes: 2 %
Lymphocytes Relative: 27 %
Lymphs Abs: 3.2 10*3/uL (ref 0.7–4.0)
MCH: 30.4 pg (ref 26.0–34.0)
MCHC: 33.1 g/dL (ref 30.0–36.0)
MCV: 91.9 fL (ref 80.0–100.0)
Monocytes Absolute: 1.4 10*3/uL — ABNORMAL HIGH (ref 0.1–1.0)
Monocytes Relative: 12 %
Neutro Abs: 6.4 10*3/uL (ref 1.7–7.7)
Neutrophils Relative %: 54 %
Platelets: 428 10*3/uL — ABNORMAL HIGH (ref 150–400)
RBC: 3.58 MIL/uL — ABNORMAL LOW (ref 3.87–5.11)
RDW: 15.5 % (ref 11.5–15.5)
WBC: 11.7 10*3/uL — ABNORMAL HIGH (ref 4.0–10.5)
nRBC: 0 % (ref 0.0–0.2)

## 2021-03-14 LAB — GLUCOSE, CAPILLARY
Glucose-Capillary: 152 mg/dL — ABNORMAL HIGH (ref 70–99)
Glucose-Capillary: 143 mg/dL — ABNORMAL HIGH (ref 70–99)
Glucose-Capillary: 104 mg/dL — ABNORMAL HIGH (ref 70–99)
Glucose-Capillary: 92 mg/dL (ref 70–99)

## 2021-03-14 MED ORDER — SODIUM ZIRCONIUM CYCLOSILICATE 10 G PO PACK
10.0000 g | PACK | Freq: Once | ORAL | Status: AC
  Administered 2021-03-14: 10 g via ORAL
  Filled 2021-03-14: qty 1

## 2021-03-14 NOTE — Evaluation (Signed)
Physical Therapy Assessment and Plan  Patient Details  Name: [name] MRN: [id number] Date of Birth: 1940-11-13  PT Diagnosis: Abnormal posture, Abnormality of gait, Difficulty walking, Impaired sensation, Muscle weakness, and Pain in L LE Rehab Potential: Good ELOS: 7-10 days   Today's Date: 03/14/2021 PT Individual Time: 4174-0814 PT Individual Time Calculation (min): 24 min    Hospital Problem: Principal Problem:   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[place])   Past Medical History:  Past Medical History:  Diagnosis Date   Allergy    Anemia    childhood   Anxiety    Aortic stenosis    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   Arthritis    Breast cancer ([place]) 12/05/10   R breast, inv mammary, in situ,ER/PR +,HER2 -   Cancer ([place])    Carcinoma of breast treated with adjuvant chemotherapy ([place])    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[place])    Claudication ([place])    Coronary artery disease    stent 2007   Depression    Diabetes mellitus    Difficulty sleeping    Diverticulosis 12/10/03   Elevated cholesterol    Generalized weakness    GERD (gastroesophageal reflux disease)    Heart murmur    Hepatitis    as an infant   HSV-1 (herpes simplex virus 1) infection    Hyperlipidemia    Hypertension    Hypothyroidism    Osteoarthritis    PAD (peripheral artery disease) ([place])    Personal history of chemotherapy    Personal history of radiation therapy    Pneumonia    2014 september and March 2022   Rheumatoid arthritis ([place])    S/P aortic valve replacement with bioprosthetic valve 05/04/2020   [facility] Inspiris Resilia stented bovine pericardial tissue valve, size 23 mm   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   LIMA to D3, SVG to PDA, EVH via right thigh   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   Dr [name]   Spinal stenosis    Thyroid disease    Past Surgical History:  Past Surgical History:  Procedure Laterality Date   ABDOMINAL AORTOGRAM W/LOWER  EXTREMITY Bilateral 04/29/2019   Procedure: ABDOMINAL AORTOGRAM W/LOWER EXTREMITY;  Surgeon: Marty Heck, MD;  Location: [facility];  Service: Cardiovascular;  Laterality: Bilateral;   ABDOMINAL AORTOGRAM W/LOWER EXTREMITY Left 07/01/2019   Procedure: ABDOMINAL AORTOGRAM W/ Left LOWER EXTREMITY [place];  Surgeon: Marty Heck, MD;  Location: [facility];  Service: Cardiovascular;  Laterality: Left;   ABDOMINAL AORTOGRAM W/LOWER EXTREMITY Left 03/03/2021   Procedure: ABDOMINAL AORTOGRAM W/LOWER EXTREMITY;  Surgeon: Cherre Robins, MD;  Location: [facility];  Service: Cardiovascular;  Laterality: Left;   AMPUTATION Left 03/06/2021   Procedure: LEFT ABOVE KNEE AMPUTATION;  Surgeon: [name], MD;  Location: [facility];  Service: Vascular;  Laterality: Left;   ANTERIOR AND POSTERIOR REPAIR N/A 04/05/2016   Procedure: ANTERIOR (CYSTOCELE) AND POSTERIOR REPAIR (RECTOCELE);  Surgeon: [name], MD;  Location: [facility];  Service: Gynecology;  Laterality: N/A;   AORTIC VALVE REPLACEMENT N/A 05/04/2020   Procedure: AORTIC VALVE REPLACEMENT (AVR) USING INSPIRIS 23MM RESILIA AORTIC VALVE;  Surgeon: Rexene Alberts, MD;  Location: [facility];  Service: Open Heart Surgery;  Laterality: N/A;   BREAST BIOPSY     BREAST LUMPECTOMY  1983   benign biopsy   BREAST LUMPECTOMY Right 12/29/2010   snbx, ER/PR +,  Her2 -, 0/1 node pos.   CARPAL TUNNEL RELEASE Bilateral 9/12,8,12   CHOLECYSTECTOMY     COLONOSCOPY N/A 02/09/2013   Procedure: COLONOSCOPY;  Surgeon: [name], MD;  [facility];  Service: Endoscopy;  Laterality: N/A;   COLONOSCOPY     CORONARY ANGIOPLASTY     CORONARY ARTERY BYPASS GRAFT N/A 05/04/2020   Procedure: CORONARY ARTERY BYPASS GRAFTING (CABG), ON PUMP, TIMES TWO, USING LEFT INTERNAL MAMMARY ARTERY AND RIGHT ENDOSCOPICALLY HARVESTED GREATER SAPHENOUS VEIN;  Surgeon: Rexene Alberts, MD;  Location: [facility];  Service: Open Heart Surgery;  Laterality:  N/A;   DILATION AND CURETTAGE OF UTERUS     ESOPHAGOGASTRODUODENOSCOPY N/A 02/09/2013   Procedure: ESOPHAGOGASTRODUODENOSCOPY (EGD);  Surgeon: [name], MD;  Location: [name];  Service: Endoscopy;  Laterality: N/A;   FOOT SPURS     HYSTEROSCOPY WITH D & C N/A 09/11/2012   Procedure: DILATATION AND CURETTAGE ;  Surgeon: [name], MD;  Location: [facility];  Service: Gynecology;  Laterality: N/A;   KNEE SURGERY  2007   LAPAROSCOPIC ASSISTED VAGINAL HYSTERECTOMY N/A 04/05/2016   Procedure: LAPAROSCOPIC ASSISTED VAGINAL HYSTERECTOMY possible BSO;  Surgeon: [name], MD;  Location: [facility];  Service: Gynecology;  Laterality: N/A;   LUMBAR LAMINECTOMY/DECOMPRESSION MICRODISCECTOMY N/A 10/20/2014   Procedure: MICRO LUMBER DECOMPRESSION L3-4 L4-5;  Surgeon: [name], MD;  [facility];  Service: Orthopedics;  Laterality: N/A;   LUMBAR LAMINECTOMY/DECOMPRESSION MICRODISCECTOMY Bilateral 10/13/2015   Procedure: MICRO LUMBAR DECOMPRESSION L5 - S1 AND REDO DECOMPRESSION L4 - L5 AND REMOVAL OF FACET CYST L4 - L5 2 LEVELS;  Surgeon: [name], MD;  [facility];  Service: Orthopedics;  Laterality: Bilateral;   PERIPHERAL VASCULAR INTERVENTION Right 04/29/2019   Procedure: PERIPHERAL VASCULAR INTERVENTION;  Surgeon: Marty Heck, MD;  Location: [facility];  Service: Cardiovascular;  Laterality: Right;   PORT-A-CATH REMOVAL  04/17/2011   Procedure: REMOVAL PORT-A-CATH;  Surgeon: [name], MD;  Location: [facility];  Service: General;  Laterality: Left;   PORTACATH PLACEMENT  02/07/2011   Procedure: INSERTION PORT-A-CATH;  Surgeon: [name], MD;  [facility];  Service: General;  Laterality: N/A;   RIGHT/LEFT HEART CATH AND CORONARY ANGIOGRAPHY N/A 03/17/2020   Procedure: RIGHT/LEFT HEART CATH AND CORONARY ANGIOGRAPHY;  Surgeon: Burnell Blanks, MD;  Location: [facility];  Service: Cardiovascular;  Laterality: N/A;    TEE WITHOUT CARDIOVERSION N/A 05/04/2020   Procedure: TRANSESOPHAGEAL ECHOCARDIOGRAM (TEE);  Surgeon: Rexene Alberts, MD;  Location: [facility];  Service: Open Heart Surgery;  Laterality: N/A;   UPPER GASTROINTESTINAL ENDOSCOPY      Assessment & Plan Clinical Impression: Patient is a 81 y.o. year old female with history of T2DM, polyneuropathy, breast cancer, CAD s/p CABG, CKD IV, PAD, recent Covid infection 01/2021, epiglottitis s/p steroids and antibiotics (Dr. Janace Hoard) who was admitted on 02/28/21 with BLE weakness with difficulty walking, fall, worsening of neuropathy, transient diplopia and found to have elevated troponin and positive H flu markers felt to be due to recent infection.  Elevated troponin felt to be due to decreased renal function and 2 D echo done revealing EF 55-60% with mild MVR and aortic replacement without leak. She was briefly treated with IV heparin and as patient asymptomatic and cardiology questioned demand ischemia and recommended medical management. MRI brain negative for acute changes. MRI thoracic and lumbar spine  showed post op changes L3-S1 with moderate L4 and L4 foraminal stenosis and L2-L3  mild to moderate canal and left lateral recess stenosis and unremarkable thoracic spine.   Dr. Carlis Abbott consulted for input on non-healing recent left toe great wound and bilateral heel wounds with rest pain. LLE angiogram revealed diffuse disease throughout the leg without options for revascularization and she was agreeable to undergo L-AKA by Dr. Unk Lightning on 03/06/21. Post op had improvement in pain but continued to be limited by weakness with orthostatic changes,  tachycardia--HR up to 130's with ambulation as well as poorly controlled BS with ABLA. CIR recommended due to functional decline. Patient transferred to CIR on 03/13/2021 .   Patient currently requires min assist with mobility secondary to muscle weakness and muscle joint tightness, decreased cardiorespiratoy endurance, and  decreased standing balance, decreased postural control, decreased balance strategies, and difficulty maintaining precautions.  Prior to hospitalization, patient was independent  with mobility and lived with Spouse (husband, [name]) in a House home.  Home access is 5 STE back door BHRs can reach both, 1 STE front no HRs.  Patient will benefit from skilled PT intervention to maximize safe functional mobility, minimize fall risk, and decrease caregiver burden for planned discharge home with 24 hour supervision.  Anticipate patient will benefit from follow up [place] at discharge.  PT - End of Session Activity Tolerance: Tolerates 30+ min activity with multiple rests Endurance Deficit: Yes Endurance Deficit Description: requires seated rest breaks and limited gait distance due to impaired endurance PT Assessment Rehab Potential (ACUTE/IP ONLY): Good PT Barriers to Discharge: Inaccessible home environment;Decreased caregiver support PT Patient demonstrates impairments in the following area(s): Balance;Perception;Behavior;Safety;Edema;Sensory;Endurance;Skin Integrity;Motor;Nutrition;Pain PT Transfers Functional Problem(s): Bed Mobility;Bed to Chair;Car;Furniture PT Locomotion Functional Problem(s): Ambulation;Wheelchair Mobility;Stairs PT Plan PT Intensity: Minimum of 1-2 x/day ,45 to 90 minutes PT Frequency: 5 out of 7 days PT Duration Estimated Length of Stay: 7-10 days PT Treatment/Interventions: Ambulation/gait training;Community reintegration;Neuromuscular re-education;DME/adaptive equipment instruction;Psychosocial support;Stair training;UE/LE Strength taining/ROM;Wheelchair propulsion/positioning;Balance/vestibular training;Discharge planning;Pain management;Skin care/wound management;Therapeutic Activities;UE/LE Coordination activities;Disease management/prevention;Functional mobility training;Patient/family education;Splinting/orthotics;Therapeutic Exercise PT Transfers Anticipated Outcome(s):  supervision using LRAD PT Locomotion Anticipated Outcome(s): supervision household distances using LRAD PT Recommendation Recommendations for Other Services: Therapeutic Recreation consult Therapeutic Recreation Interventions: Kitchen group Follow Up Recommendations: 24 hour supervision/assistance;Home health PT Patient destination: Home Equipment Recommended: To be determined   PT Evaluation Precautions/Restrictions Precautions Precautions: Fall;Other (comment) Precaution Comments: left AKA Restrictions Weight Bearing Restrictions: Yes LLE Weight Bearing: Non weight bearing Pain Pain Assessment Pain Scale: 0-10 Pain Score: 7  Pain Type: Phantom pain;Surgical pain;Acute pain Pain Location: Leg Pain Orientation: Left Pain Descriptors / Indicators: Other (Comment);Burning (phantom pain) Pain Onset: With Activity Pain Intervention(s): Rest;Medication (See eMAR);Relaxation;Emotional support;Distraction Pain Interference Pain Interference Pain Effect on Sleep: 2. Occasionally Pain Interference with Therapy Activities: 1. Rarely or not at all Pain Interference with Day-to-Day Activities: 1. Rarely or not at all Home Living/Prior [place] Available Help at Discharge: Family;Available 24 hours/day Type of Home: House Home Access: Stairs to enter [employer] of Steps: 5 STE back door BHRs can reach both, 1 STE front no HRs Home Layout: One level Bathroom Shower/Tub: Chiropodist: Handicapped height Bathroom Accessibility: Yes  Lives With: Spouse (husband, [name]) Prior Function Level of Independence: Independent with gait;Independent with transfers;Independent with homemaking with ambulation (without AD)  Able to Take Stairs?: Yes Driving: Yes Vision/Perception  Vision - History Ability to See in Adequate Light: 1 Impaired Perception Perception: Within Functional Limits Praxis Praxis: Intact  Cognition Overall Cognitive  Status: Within Functional Limits for tasks assessed Arousal/Alertness: Awake/alert  Orientation Level: Oriented X4 Year: 2023 Month: February Day of Week: Correct Memory: Appears intact Awareness: Appears intact Problem Solving: Appears intact Safety/Judgment: Appears intact Sensation Sensation Light Touch: Impaired Detail Peripheral sensation comments: hx of peripheral neuropathy with decreased sensation distally in RLE Light Touch Impaired Details: Impaired RLE Hot/Cold: Not tested Proprioception: Appears Intact Stereognosis: Not tested Coordination Gross Motor Movements are Fluid and Coordinated: Yes Motor  Motor Motor: Within Functional Limits Motor - Skilled Clinical Observations: Generalized weakness and deconditioning   Trunk/Postural Assessment  Cervical Assessment Cervical Assessment: Exceptions to [facility] (slight forward head) Thoracic Assessment Thoracic Assessment: Exceptions to [facility] (slight thoracic rounding) Lumbar Assessment Lumbar Assessment: Exceptions to WFL (slight posterior pelvic tilt in sitting)  Balance Balance Balance Assessed: Yes Static Sitting Balance Static Sitting - Balance Support: Bilateral upper extremity supported Static Sitting - Level of Assistance: 7: Independent Dynamic Sitting Balance Dynamic Sitting - Balance Support: During functional activity Dynamic Sitting - Level of Assistance: 5: Stand by assistance Static Standing Balance Static Standing - Balance Support: During functional activity;Bilateral upper extremity supported Static Standing - Level of Assistance: Other (comment) (CGA) Dynamic Standing Balance Dynamic Standing - Balance Support: During functional activity;Bilateral upper extremity supported Dynamic Standing - Level of Assistance: 4: Min assist Extremity Assessment      RLE Assessment RLE Assessment: Exceptions to [facility] Active Range of Motion (AROM) Comments: WFL/WNL General Strength Comments: assessed in sitting RLE  Strength Right Hip Flexion: 3+/5 Right Knee Flexion: 4-/5 Right Knee Extension: 4/5 Right Ankle Dorsiflexion: 4-/5 Right Ankle Plantar Flexion: 4-/5 LLE Assessment LLE Assessment: Exceptions to [facility] Active Range of Motion (AROM) Comments: lacks full hip extension ROM in standing but otherwise [facility] General Strength Comments: no resistance applied due to pain with pt demonstrating at least 3/5 strength LLE Strength Left Hip Flexion: 3/5 Left Hip Extension: 3/5 Left Hip ABduction: 3/5 Left Hip ADduction: 3/5   Care Tool Care Tool Bed Mobility Roll left and right activity   Roll left and right assist level: Supervision/Verbal cueing    Sit to lying activity   Sit to lying assist level: Supervision/Verbal cueing    Lying to sitting on side of bed activity   Lying to sitting on side of bed assist level: the ability to move from lying on the back to sitting on the side of the bed with no back support.: Supervision/Verbal cueing     Care Tool Transfers Sit to stand transfer   Sit to stand assist level: Minimal Assistance - Patient > 75% Sit to stand assistive device: Armrests;Walker  Chair/bed transfer   Chair/bed transfer assist level: Minimal Assistance - Patient > 75% Chair/bed transfer assistive device: Armrests;Walker   Physiological scientist transfer assist level: Minimal Assistance - Patient > 75% Health visitor Comment: RW    Care Corporate treasurer   Assist level: Minimal Assistance - Patient > 75% Assistive device: Walker-rolling Max distance: 71f  Walk 10 feet activity   Assist level: Minimal Assistance - Patient > 75% Assistive device: Walker-rolling   Walk 50 feet with 2 turns activity Walk 50 feet with 2 turns activity did not occur: Safety/medical concerns      Walk 150 feet activity Walk 150 feet activity did not occur: Safety/medical concerns      Walk 10 feet on uneven surfaces activity Walk 10 feet on uneven  surfaces activity did not occur: Safety/medical concerns      Stairs Stair  activity did not occur: Safety/medical concerns        Walk up/down 1 step activity Walk up/down 1 step or curb (drop down) activity did not occur: Safety/medical concerns      Walk up/down 4 steps activity Walk up/down 4 steps activity did not occur: Safety/medical concerns      Walk up/down 12 steps activity Walk up/down 12 steps activity did not occur: Safety/medical concerns      Pick up small objects from floor   Pick up small object from the floor assist level: Moderate Assistance - Patient 50 - 74%    Wheelchair Is the patient using a wheelchair?: Yes Type of Wheelchair: Manual   Wheelchair assist level: Supervision/Verbal cueing;Set up assist Max wheelchair distance: 156f  Wheel 50 feet with 2 turns activity   Assist Level: Supervision/Verbal cueing  Wheel 150 feet activity   Assist Level: Moderate Assistance - Patient 50 - 74%    Refer to Care Plan for Long Term Goals  SHORT TERM GOAL WEEK 1 PT Short Term Goal 1 (Week 1): = to LTGs based on ELOS  Recommendations for other services: Therapeutic Recreation  Kitchen group  Skilled Therapeutic Intervention Pt received sitting in recliner and agreeable to therapy session. Evaluation completed (see details above) with patient education regarding purpose of PT evaluation, PT POC and goals, therapy schedule, weekly team meetings, and other CIR information including safety plan and fall risk safety. Pt completed the below functional mobility tasks with the specified levels of skilled assistance and cuing. Pt reports her LTG is to receive a prosthetic and pt is an excellent candidate for this; however, she will require a means of modified independent mobility until her residual limb is healed to initiate the prosthetic process. Therapist educated pt on importance of maintaining L LE hip extension ROM and glute strength/activation for future prosthetic  use. Therapist also educated on importance of limb shaping for prosthetic fit. At end of session pt becoming upset on her stomach due to pain medication administration prior to therapist arrival - pt left seated in recliner with needs in reach and seat belt alarm on.  Mobility Bed Mobility Bed Mobility: Supine to Sit;Sit to Supine Supine to Sit: Supervision/Verbal cueing Sit to Supine: Supervision/Verbal cueing Transfers Transfers: Sit to Stand;Stand to Sit;Stand Pivot Transfers Sit to Stand: Minimal Assistance - Patient > 75% Stand to Sit: Minimal Assistance - Patient > 75% Stand Pivot Transfers: Minimal Assistance - Patient > 75% Stand Pivot Transfer Details: Tactile cues for sequencing;Verbal cues for safe use of DME/AE;Verbal cues for precautions/safety;Verbal cues for technique;Verbal cues for gait pattern;Verbal cues for sequencing;Tactile cues for posture;Tactile cues for weight shifting;Visual cues for safe use of DME/AE Transfer (Assistive device): Rolling walker Locomotion  Gait Ambulation: Yes Gait Assistance: Minimal Assistance - Patient > 75% Gait Distance (Feet): 15 Feet Assistive device: Rolling walker Gait Assistance Details: Manual facilitation for weight shifting;Verbal cues for gait pattern;Verbal cues for technique;Verbal cues for precautions/safety;Verbal cues for safe use of DME/AE;Tactile cues for sequencing;Tactile cues for posture;Tactile cues for weight shifting;Tactile cues for placement;Tactile cues for weight beaing;Visual cues for safe use of DME/AE Gait Gait: Yes Gait Pattern: Impaired Gait Pattern: Poor foot clearance - right (hop-to pattern using RW with poor R LE foot clearance with fatigue) Gait velocity: decreased Stairs / Additional Locomotion Stairs: No Wheelchair Mobility Wheelchair Mobility: Yes Wheelchair Assistance: SChartered loss adjuster Both upper extremities Wheelchair Parts Management: Needs assistance Distance:  1085f  Discharge Criteria: Patient will be discharged  from PT if patient refuses treatment 3 consecutive times without medical reason, if treatment goals not met, if there is a change in medical status, if patient makes no progress towards goals or if patient is discharged from hospital.  The above assessment, treatment plan, treatment alternatives and goals were discussed and mutually agreed upon: by patient  Tawana Scale , PT, DPT, NCS, CSRS  03/14/2021, 7:57 AM

## 2021-03-14 NOTE — Progress Notes (Signed)
Occupational Therapy Session Note  Patient Details  Name: [name] MRN: [id number] Date of Birth: May 23, 1940  Today's Date: 03/14/2021 OT Individual Time: 2633-3545 OT Individual Time Calculation (min): 56 min    Short Term Goals: Week 1:  OT Short Term Goal 1 (Week 1): STGs equal to LTGs set a supervision to modified independent based on LOS.  Skilled Therapeutic Interventions/Progress Updates:  Pt greeted seated in recliner reporting fatigue but agreeable to OT intervention. Session focus on BADL reeducation,functional mobility in apt, kitchen safety,  dynamic standing balance and decreasing overall caregiver burden.  Pt transported to apt where pt completed stand pivot transfers throughout session with CGA -MIN A with Rw to EOB, recliner and TTB. Pt completed simulated meal prep task from w/c level with pt able to reach into oven, refrigerator, low cabinets with supervision- intermittent MIN cues to remember to lock brakes from w/c level. Pt able to sit<>stand with CGA to put plates and bowls in [place] with Rw. Pt reports she will likely complete all kitchen tasks from w/c level but is understanding that pt would need assist to stand at sink or at stove. Discussed DME needs during session including BSC ( pt reports her toilet is difficult to access and would like a BSC to put in extra bedroom), TTB and removable shower head. Pt transported back to room with total A where   pt completed stand pivot to [facility] with CGA, MIN A for 3/3 toileting tasks needing assistance for clothing mgmt. pt left supine in bed with all needs within reach.                   Therapy Documentation Precautions:  Precautions Precautions: Fall, Other (comment) Precaution Comments: left AKA Restrictions Weight Bearing Restrictions: Yes LLE Weight Bearing: Non weight bearing  Pain: no pain reported during session     Therapy/Group: Individual Therapy  Precious Haws 03/14/2021, 3:44 PM

## 2021-03-14 NOTE — Progress Notes (Signed)
[facility] Individual Statement of Services  Patient Name:  [name]  Date:  03/14/2021  Welcome to the [facility].  Our goal is to provide you with an individualized program based on your diagnosis and situation, designed to meet your specific needs.  With this comprehensive rehabilitation program, you will be expected to participate in at least 3 hours of rehabilitation therapies Monday-Friday, with modified therapy programming on the weekends.  Your rehabilitation program will include the following services:  Physical Therapy (PT), Occupational Therapy (OT), Speech Therapy (ST), 24 hour per day rehabilitation nursing, Therapeutic Recreaction (TR), Neuropsychology, Care Coordinator, Rehabilitation Medicine, Nutrition Services, Pharmacy Services, and Other  Weekly team conferences will be held on Wednesdays to discuss your progress.  Your Inpatient Rehabilitation Care Coordinator will talk with you frequently to get your input and to update you on team discussions.  Team conferences with you and your family in attendance may also be held.  Expected length of stay:  10-12 Days  Overall anticipated outcome:  Supervision to MOD I  Depending on your progress and recovery, your program may change. Your Inpatient Rehabilitation Care Coordinator will coordinate services and will keep you informed of any changes. Your Inpatient Rehabilitation Care Coordinator's name and contact numbers are listed  below.  The following services may also be recommended but are not provided by the [facility]:   [place] will be made to provide these services after discharge if needed.  Arrangements include referral to agencies that provide these services.  Your insurance has been verified to be:  [place] Your primary doctor is:   Arlester Marker, MD  Pertinent  information will be shared with your doctor and your insurance company.  Inpatient Rehabilitation Care Coordinator:  Erlene Quan, [place] or [phone]  Information discussed with and copy given to patient by: Dyanne Iha, 03/14/2021, 10:38 AM

## 2021-03-14 NOTE — Plan of Care (Signed)
Problem: RH Balance Goal: LTG Patient will maintain dynamic standing balance (PT) Description: LTG:  Patient will maintain dynamic standing balance with assistance during mobility activities (PT) Flowsheets (Taken 03/14/2021 1912) LTG: Pt will maintain dynamic standing balance during mobility activities with:: Contact Guard/Touching assist   Problem: Sit to Stand Goal: LTG:  Patient will perform sit to stand with assistance level (PT) Description: LTG:  Patient will perform sit to stand with assistance level (PT) Flowsheets (Taken 03/14/2021 1912) LTG: PT will perform sit to stand in preparation for functional mobility with assistance level: Supervision/Verbal cueing   Problem: RH Balance Goal: LTG Patient will maintain dynamic sitting balance (PT) Description: LTG:  Patient will maintain dynamic sitting balance with assistance during mobility activities (PT) Flowsheets (Taken 03/14/2021 1912) LTG: Pt will maintain dynamic sitting balance during mobility activities with:: Independent with assistive device  Goal: LTG Patient will maintain dynamic standing balance (PT) Description: LTG:  Patient will maintain dynamic standing balance with assistance during mobility activities (PT) Flowsheets (Taken 03/14/2021 1912) LTG: Pt will maintain dynamic standing balance during mobility activities with:: Contact Guard/Touching assist   Problem: Sit to Stand Goal: LTG:  Patient will perform sit to stand with assistance level (PT) Description: LTG:  Patient will perform sit to stand with assistance level (PT) Flowsheets (Taken 03/14/2021 1912) LTG: PT will perform sit to stand in preparation for functional mobility with assistance level: Supervision/Verbal cueing   Problem: RH Bed Mobility Goal: LTG Patient will perform bed mobility with assist (PT) Description: LTG: Patient will perform bed mobility with assistance, with/without cues (PT). Flowsheets (Taken 03/14/2021 1912) LTG: Pt will perform bed  mobility with assistance level of: Supervision/Verbal cueing   Problem: RH Bed to Chair Transfers Goal: LTG Patient will perform bed/chair transfers w/assist (PT) Description: LTG: Patient will perform bed to chair transfers with assistance (PT). Flowsheets (Taken 03/14/2021 1912) LTG: Pt will perform Bed to Chair Transfers with assistance level: Supervision/Verbal cueing   Problem: RH Car Transfers Goal: LTG Patient will perform car transfers with assist (PT) Description: LTG: Patient will perform car transfers with assistance (PT). Flowsheets (Taken 03/14/2021 1912) LTG: Pt will perform car transfers with assist:: Supervision/Verbal cueing   Problem: RH Ambulation Goal: LTG Patient will ambulate in controlled environment (PT) Description: LTG: Patient will ambulate in a controlled environment, # of feet with assistance (PT). Flowsheets (Taken 03/14/2021 1912) LTG: Pt will ambulate in controlled environ  assist needed:: Supervision/Verbal cueing LTG: Ambulation distance in controlled environment: 76ft using LRAD Goal: LTG Patient will ambulate in home environment (PT) Description: LTG: Patient will ambulate in home environment, # of feet with assistance (PT). Flowsheets (Taken 03/14/2021 1912) LTG: Pt will ambulate in home environ  assist needed:: Supervision/Verbal cueing LTG: Ambulation distance in home environment: 83ft using LRAD   Problem: RH Wheelchair Mobility Goal: LTG Patient will propel w/c in controlled environment (PT) Description: LTG: Patient will propel wheelchair in controlled environment, # of feet with assist (PT) Flowsheets (Taken 03/14/2021 1912) LTG: Pt will propel w/c in controlled environ  assist needed:: Independent with assistive device LTG: Propel w/c distance in controlled environment: 158ft Goal: LTG Patient will propel w/c in home environment (PT) Description: LTG: Patient will propel wheelchair in home environment, # of feet with assistance (PT). Flowsheets  (Taken 03/14/2021 1912) LTG: Pt will propel w/c in home environ  assist needed:: Independent with assistive device LTG: Propel w/c distance in home environment: 42ft   Problem: RH Stairs Goal: LTG Patient will ambulate up and down stairs  w/assist (PT) Description: LTG: Patient will ambulate up and down # of stairs with assistance (PT) Flowsheets (Taken 03/14/2021 1912) LTG: Pt will ambulate up/down stairs assist needed:: Contact Guard/Touching assist LTG: Pt will  ambulate up and down number of stairs: 1 step using LRAD per home set-up

## 2021-03-14 NOTE — Progress Notes (Signed)
Inpatient Rehabilitation Care Coordinator Assessment and Plan Patient Details  Name: [name] MRN: [id number] Date of Birth: 10/05/1940  Today's Date: 03/14/2021  Hospital Problems: Principal Problem:   Above knee amputation of left lower extremity [facility])  Past Medical History:  Past Medical History:  Diagnosis Date   Allergy    Anemia    childhood   Anxiety    Aortic stenosis    Arterial stenosis (HCC)    mesenteric   Arthritis    Breast cancer ([place]) 12/05/10   R breast, inv mammary, in situ,ER/PR +,HER2 -   Cancer ([place])    Carcinoma of breast treated with adjuvant chemotherapy ([place])    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[place])    Claudication ([place])    Coronary artery disease    stent 2007   Depression    Diabetes mellitus    Difficulty sleeping    Diverticulosis 12/10/03   Elevated cholesterol    Generalized weakness    GERD (gastroesophageal reflux disease)    Heart murmur    Hepatitis    as an infant   HSV-1 (herpes simplex virus 1) infection    Hyperlipidemia    Hypertension    Hypothyroidism    Osteoarthritis    PAD (peripheral artery disease) ([place])    Personal history of chemotherapy    Personal history of radiation therapy    Pneumonia    2014 september and March 2022   Rheumatoid arthritis ([place])    S/P aortic valve replacement with bioprosthetic valve 05/04/2020   [facility] Inspiris Resilia stented bovine pericardial tissue valve, size 23 mm   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   LIMA to D3, SVG to PDA, EVH via right thigh   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   Dr [name]   Spinal stenosis    Thyroid disease    Past Surgical History:  Past Surgical History:  Procedure Laterality Date   ABDOMINAL AORTOGRAM W/LOWER EXTREMITY Bilateral 04/29/2019   Procedure: ABDOMINAL AORTOGRAM W/LOWER EXTREMITY;  Surgeon: Marty Heck, MD;  Location: [place] [facility];  Service: Cardiovascular;  Laterality: Bilateral;   ABDOMINAL AORTOGRAM W/LOWER  EXTREMITY Left 07/01/2019   Procedure: ABDOMINAL AORTOGRAM W/ Left LOWER EXTREMITY [place];  Surgeon: Marty Heck, MD;  Location: [place] [facility];  Service: Cardiovascular;  Laterality: Left;   ABDOMINAL AORTOGRAM W/LOWER EXTREMITY Left 03/03/2021   Procedure: ABDOMINAL AORTOGRAM W/LOWER EXTREMITY;  Surgeon: Cherre Robins, MD;  Location: [place] [facility];  Service: Cardiovascular;  Laterality: Left;   AMPUTATION Left 03/06/2021   Procedure: LEFT ABOVE KNEE AMPUTATION;  Surgeon: [name], MD;  Location: [place];  Service: Vascular;  Laterality: Left;   ANTERIOR AND POSTERIOR REPAIR N/A 04/05/2016   Procedure: ANTERIOR (CYSTOCELE) AND POSTERIOR REPAIR (RECTOCELE);  Surgeon: [name], MD;  Location: [facility];  Service: Gynecology;  Laterality: N/A;   AORTIC VALVE REPLACEMENT N/A 05/04/2020   Procedure: AORTIC VALVE REPLACEMENT (AVR) USING INSPIRIS 23MM RESILIA AORTIC VALVE;  Surgeon: Rexene Alberts, MD;  Location: [facility];  Service: Open Heart Surgery;  Laterality: N/A;   BREAST BIOPSY     BREAST LUMPECTOMY  1983   benign biopsy   BREAST LUMPECTOMY Right 12/29/2010   snbx, ER/PR +, Her2 -, 0/1 node pos.   CARPAL TUNNEL RELEASE Bilateral 9/12,8,12   CHOLECYSTECTOMY     COLONOSCOPY N/A 02/09/2013   Procedure: COLONOSCOPY;  Surgeon: [name], MD;  [facility];  Service:  Endoscopy;  Laterality: N/A;   COLONOSCOPY     CORONARY ANGIOPLASTY     CORONARY ARTERY BYPASS GRAFT N/A 05/04/2020   Procedure: CORONARY ARTERY BYPASS GRAFTING (CABG), ON PUMP, TIMES TWO, USING LEFT INTERNAL MAMMARY ARTERY AND RIGHT ENDOSCOPICALLY HARVESTED GREATER SAPHENOUS VEIN;  Surgeon: Rexene Alberts, MD;  Location: [place];  Service: Open Heart Surgery;  Laterality: N/A;   DILATION AND CURETTAGE OF UTERUS     ESOPHAGOGASTRODUODENOSCOPY N/A 02/09/2013   Procedure: ESOPHAGOGASTRODUODENOSCOPY (EGD);  Surgeon: [name], MD;  Location: [name];  Service: Endoscopy;  Laterality: N/A;    FOOT SPURS     HYSTEROSCOPY WITH D & C N/A 09/11/2012   Procedure: DILATATION AND CURETTAGE ;  Surgeon: [name], MD;  Location: [facility];  Service: Gynecology;  Laterality: N/A;   KNEE SURGERY  2007   LAPAROSCOPIC ASSISTED VAGINAL HYSTERECTOMY N/A 04/05/2016   Procedure: LAPAROSCOPIC ASSISTED VAGINAL HYSTERECTOMY possible BSO;  Surgeon: [name], MD;  Location: [facility];  Service: Gynecology;  Laterality: N/A;   LUMBAR LAMINECTOMY/DECOMPRESSION MICRODISCECTOMY N/A 10/20/2014   Procedure: MICRO LUMBER DECOMPRESSION L3-4 L4-5;  Surgeon: [name], MD;  [facility];  Service: Orthopedics;  Laterality: N/A;   LUMBAR LAMINECTOMY/DECOMPRESSION MICRODISCECTOMY Bilateral 10/13/2015   Procedure: MICRO LUMBAR DECOMPRESSION L5 - S1 AND REDO DECOMPRESSION L4 - L5 AND REMOVAL OF FACET CYST L4 - L5 2 LEVELS;  Surgeon: [name], MD;  [facility];  Service: Orthopedics;  Laterality: Bilateral;   PERIPHERAL VASCULAR INTERVENTION Right 04/29/2019   Procedure: PERIPHERAL VASCULAR INTERVENTION;  Surgeon: Marty Heck, MD;  Location: [place];  Service: Cardiovascular;  Laterality: Right;   PORT-A-CATH REMOVAL  04/17/2011   Procedure: REMOVAL PORT-A-CATH;  Surgeon: [name], MD;  Location: [facility];  Service: General;  Laterality: Left;   PORTACATH PLACEMENT  02/07/2011   Procedure: INSERTION PORT-A-CATH;  Surgeon: [name], MD;  [facility];  Service: General;  Laterality: N/A;   RIGHT/LEFT HEART CATH AND CORONARY ANGIOGRAPHY N/A 03/17/2020   Procedure: RIGHT/LEFT HEART CATH AND CORONARY ANGIOGRAPHY;  Surgeon: Burnell Blanks, MD;  Location: [facility];  Service: Cardiovascular;  Laterality: N/A;   TEE WITHOUT CARDIOVERSION N/A 05/04/2020   Procedure: TRANSESOPHAGEAL ECHOCARDIOGRAM (TEE);  Surgeon: Rexene Alberts, MD;  Location: [place] [facility];  Service: Open Heart Surgery;  Laterality: N/A;   UPPER GASTROINTESTINAL ENDOSCOPY      Social History:  reports that she quit smoking about 23 years ago. Her smoking use included cigarettes. She has a 80.00 pack-year smoking history. She has never used smokeless tobacco. She reports current alcohol use of about 1.0 standard drink per week. She reports that she does not use drugs.  Family / Support Systems Patient Roles: Parent, Spouse Spouse/Significant Other: Oscar La Children: Maryjean Ka Anticipated Caregiver: [name] Ability/Limitations of Caregiver: n/a Caregiver Availability: 24/7 Family Dynamics: support from spouse and daughter  Social History Preferred language: English Religion: [employer] - How often do you need to have someone help you when you read instructions, pamphlets, or other written material from your doctor or pharmacy?: Never Writes: Yes Employment Status: Retired Public relations account executive Issues: n/a Guardian/Conservator: International aid/development worker   Abuse/Neglect Abuse/Neglect Assessment Can Be Completed: Yes Physical Abuse: Denies Verbal Abuse: Denies Sexual Abuse: Denies Exploitation of patient/patient's resources: Denies Self-Neglect: Denies  Patient response to: Social Isolation - How often do you feel lonely or isolated from those around you?: Never  Emotional Status Recent Psychosocial Issues: hx of  anixety, depression Psychiatric History: hx of anixety, depression Substance Abuse History: n/a  Patient / Family Perceptions, Expectations & Goals Pt/Family understanding of illness & functional limitations: yes Premorbid pt/family roles/activities: Patient furniture surfed inside the home, used a cart in the community, MOD with ADLS Anticipated changes in roles/activities/participation: anticipating mod i to supervision goals, spouse able to assist at home Pt/family expectations/goals: supervision to Echo: None Premorbid Home Care/DME Agencies: Other (Comment) Librarian, academic (was her  mothers)) Transportation available at discharge: family able to transport Is the patient able to respond to transportation needs?: Yes In the past 12 months, has lack of transportation kept you from medical appointments or from getting medications?: No In the past 12 months, has lack of transportation kept you from meetings, work, or from getting things needed for daily living?: No Resource referrals recommended: Neuropsychology  Discharge Planning Living Arrangements: Spouse/significant other Support Systems: Children, Spouse/significant other Type of Residence: Private residence Insurance Resources: Multimedia programmer (specify) ([employer]HC MEDICARE) Financial Resources: Family Support Financial Screen Referred: No Living Expenses: Lives with family Money Management: Spouse Does the patient have any problems obtaining your medications?: No 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 Introduced self, explained role and addressed questions and concerns. Patient plans to discharge home at sup/MOD I level with spouse. Daughter able to assist with needed. No additional questions or concerns.   Dyanne Iha 03/14/2021, 12:22 PM

## 2021-03-14 NOTE — IPOC Note (Addendum)
Overall Plan of Care [facility]) Patient Details Name: [name] MRN: [id number] DOB: 18-Mar-1940  Admitting Diagnosis: Above knee amputation of left lower extremity [facility])  Hospital Problems: Principal Problem:   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[place])     Functional Problem List: Nursing Medication Management, Safety, Bowel, Endurance, Pain, Edema, Skin Integrity  PT Balance, Perception, Behavior, Safety, Edema, Sensory, Endurance, Skin Integrity, Motor, Nutrition, Pain  OT Balance, Pain  SLP    TR         Basic ADLs: OT Eating, Grooming, Bathing, Dressing, Toileting     Advanced  ADLs: OT Simple Meal Preparation     Transfers: PT Bed Mobility, Bed to Chair, Car, Patent attorney, Agricultural engineer: PT Ambulation, Emergency planning/management officer, Stairs     Additional Impairments: OT None  SLP        TR      Anticipated Outcomes Item Anticipated Outcome  Self Feeding independent  Swallowing      Basic self-care  supervision  Toileting  supervision   Bathroom Transfers supervision  Bowel/Bladder  manage bowel w mod I assist  Transfers  supervision using LRAD  Locomotion  supervision household distances using LRAD  Communication     Cognition     Pain  pain at or below level 4 with prns  Safety/Judgment  maintain safety w cues   Therapy Plan: PT Intensity: Minimum of 1-2 x/day ,45 to 90 minutes PT Frequency: 5 out of 7 days PT Duration Estimated Length of Stay: 7-10 days OT Intensity: Minimum of 1-2 x/day, 45 to 90 minutes OT Frequency: 5 out of 7 days OT Duration/Estimated Length of Stay: 7-9 days     Due to the current state of emergency, patients may not be receiving their 3-hours of Medicare-mandated therapy.   Team Interventions: Nursing Interventions Disease Management/Prevention, Medication Management, Discharge Planning, Skin Care/Wound Management, Pain Management, Bowel Management, Patient/Family Education  PT  interventions Ambulation/gait training, Community reintegration, Neuromuscular re-education, DME/adaptive equipment instruction, Psychosocial support, Stair training, UE/LE Strength taining/ROM, Wheelchair propulsion/positioning, Training and development officer, Discharge planning, Pain management, Skin care/wound management, Therapeutic Activities, UE/LE Coordination activities, Disease management/prevention, Functional mobility training, Patient/family education, Splinting/orthotics, Therapeutic Exercise  OT Interventions Training and development officer, DME/adaptive equipment instruction, Discharge planning, Functional mobility training, Community reintegration, Self Care/advanced ADL retraining, Therapeutic Exercise, Wheelchair propulsion/positioning, UE/LE Strength taining/ROM, Pain management, Patient/family education, Splinting/orthotics, UE/LE Coordination activities, Therapeutic Activities  SLP Interventions    TR Interventions    SW/CM Interventions Discharge Planning, Psychosocial Support, Patient/Family Education, Disease Management/Prevention   Barriers to Discharge MD  Medical stability  Nursing Decreased caregiver support, Home environment access/layout, Wound Care 1 level 1ste front/ 5ste back w spouse  PT Inaccessible home environment, Decreased caregiver support    OT      SLP      SW Insurance for SNF coverage     Team Discharge Planning: Destination: PT-Home ,OT- Home , SLP-  Projected Follow-up: PT-24 hour supervision/assistance, Home health PT, OT-  24 hour supervision/assistance, Home health OT, SLP-  Projected Equipment Needs: PT-To be determined, OT- 3 in 1 bedside comode, Tub/shower bench, SLP-  Equipment Details: PT- , OT-  Patient/family involved in discharge planning: PT- Patient,  OT-Patient, SLP-   MD ELOS: 7-9d Medical Rehab Prognosis:  Good Assessment: 81 year old female with history of T2DM, polyneuropathy, breast cancer, CAD s/p CABG, CKD IV, PAD, recent Covid  infection 01/2021, epiglottitis s/p steroids and antibiotics (Dr. Janace Hoard) who was admitted on  02/28/21 with BLE weakness with difficulty walking, fall, worsening of neuropathy, transient diplopia and found to have elevated troponin and positive H flu markers felt to be due to recent infection.  Elevated troponin felt to be due to decreased renal function and 2 D echo done revealing EF 55-60% with mild MVR and aortic replacement without leak. She was briefly treated with IV heparin and as patient asymptomatic and cardiology questioned demand ischemia and recommended medical management. MRI brain negative for acute changes. MRI thoracic and lumbar spine  showed post op changes L3-S1 with moderate L4 and L4 foraminal stenosis and L2-L3 mild to moderate canal and left lateral recess stenosis and unremarkable thoracic spine.   Dr. Carlis Abbott consulted for input on non-healing recent left toe great wound and bilateral heel wounds with rest pain. LLE angiogram revealed diffuse disease throughout the leg without options for revascularization and she was agreeable to undergo L-AKA by Dr. Unk Lightning on 03/06/21. Post op had improvement in pain but continued to be limited by weakness with orthostatic changes,  tachycardia--HR up to 130's with ambulation as well as poorly controlled BS with ABLA. CIR recommended due to functional decline. Currently complains of weakness.    See Team Conference Notes for weekly updates to the plan of care

## 2021-03-14 NOTE — Progress Notes (Signed)
Inpatient Rehabilitation  Patient information reviewed and entered into eRehab system by Avo Schlachter M. Camellia Popescu, M.A., CCC/SLP, PPS Coordinator.  Information including medical coding, functional ability and quality indicators will be reviewed and updated through discharge.    

## 2021-03-14 NOTE — Progress Notes (Signed)
PROGRESS NOTE   Subjective/Complaints:  No issues overnite, chronic insomnia, some phantom pain but does not want med escalation   ROS- neg CP, SOB< N/V/D  Objective:   No results found. Recent Labs    03/12/21 0132 03/14/21 0507  WBC 9.4 11.7*  HGB 10.0* 10.9*  HCT 31.6* 32.9*  PLT 373 428*   Recent Labs    03/12/21 0132 03/14/21 0507  NA 133* 136  K 4.7 5.3*  CL 100 101  CO2 23 26  GLUCOSE 222* 138*  BUN 31* 26*  CREATININE 1.26* 1.44*  CALCIUM 8.8* 9.2    Intake/Output Summary (Last 24 hours) at 03/14/2021 0824 Last data filed at 03/14/2021 4742 Gross per 24 hour  Intake 240 ml  Output --  Net 240 ml        Physical Exam: Vital Signs Blood pressure (!) 159/66, pulse 88, temperature (!) 97.5 F (36.4 C), temperature source Oral, resp. rate 16, SpO2 97 %.  General: No acute distress Mood and affect are appropriate Heart: Regular rate and rhythm no rubs murmurs or extra sounds Lungs: Clear to auscultation, breathing unlabored, no rales or wheezes Abdomen: Positive bowel sounds, soft nontender to palpation, nondistended Extremities: No clubbing, cyanosis, or edema Skin: No evidence of breakdown, no evidence of rash Neurologic: Cranial nerves II through XII intact, motor strength is 5/5 in bilateral deltoid, bicep, tricep, grip, RIght hip flexor, knee extensors, ankle dorsiflexor and plantar flexor Sensory exam normal sensation to light touch and proprioception in bilateral upper and lower extremities  Musculoskeletal: Full range of motion in all 4 extremities. No joint swelling     Assessment/Plan: 1. Functional deficits which require 3+ hours per day of interdisciplinary therapy in a comprehensive inpatient rehab setting. Physiatrist is providing close team supervision and 24 hour management of active medical problems listed below. Physiatrist and rehab team continue to assess barriers to  discharge/monitor patient progress toward functional and medical goals  Care Tool:  Bathing              Bathing assist       Upper Body Dressing/Undressing Upper body dressing   What is the patient wearing?: Hospital gown only    Upper body assist Assist Level: Supervision/Verbal cueing    Lower Body Dressing/Undressing Lower body dressing      What is the patient wearing?: Hospital gown only     Lower body assist Assist for lower body dressing: Supervision/Verbal cueing     Toileting Toileting    Toileting assist Assist for toileting: Minimal Assistance - Patient > 75%     Transfers Chair/bed transfer  Transfers assist     Chair/bed transfer assist level: Minimal Assistance - Patient > 75%     Locomotion Ambulation   Ambulation assist              Walk 10 feet activity   Assist           Walk 50 feet activity   Assist           Walk 150 feet activity   Assist           Walk 10 feet on uneven surface  activity   Assist           Wheelchair     Assist               Wheelchair 50 feet with 2 turns activity    Assist            Wheelchair 150 feet activity     Assist          Blood pressure (!) 159/66, pulse 88, temperature (!) 97.5 F (36.4 C), temperature source Oral, resp. rate 16, SpO2 97 %.  Medical Problem List and Plan: 1. 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  Impaired mobility -DVT/anticoagulation:  Pharmaceutical: Heparin             -antiplatelet therapy: continue aspirin 81mg .  3. Postoperative pain: Hydrocodone prn. Continue vitamin C 250mg  BID.  4. Mood: LCSW to follow for evaluation and support.              -antipsychotic agents: N/A 5. Neuropsych: This patient is capable of making decisions on her own behalf. 6. Skin/Wound Care:  Routine pressure relief measures.              --Add vitamin C and Zinc. AKA incision looks great 7. Fluids/Electrolytes/Nutrition: Monitor I/O. Check CMET in am.  8. T2DM: Monitor BS ac/hs. Conitnue insulin glargine 26 units BID--discontinue ensure supplements between meals.  --Will add juven and Ensure max instead of Ensure Enlive to help with BS control/wound healing.              --resume ozempic  and glucotrol--> daughter to bring in ozempic. Will likely need meal coverage and insulin glargine decreased as home meds are resumed.  CBG (last 3)  Recent Labs    03/13/21 1735 03/13/21 2144 03/14/21 0707  GLUCAP 92 110* 152*    9. HTN: Monitor BP TID Vitals:   03/13/21 2017 03/14/21 0322  BP: (!) 150/56 (!) 159/66  Pulse: 85 88  Resp: 15 16  Temp: 97.9 F (36.6 C) (!) 97.5 F (36.4 C)  SpO2: 97% 97%    10. CAD s/p CABG: Monitor for symptoms with increase in activity.             --Continue ASA, BB and  11. RA: Managed with infusion every 2 months (Dr. Trudie Reed)             --aware of need to hold medication to help promote healing.  12.  Depression: Managed with Paxil at current dose.  13. Constipation: continue colace 100mg  BID    LOS: 1 days A FACE TO FACE EVALUATION WAS PERFORMED  Charlett Blake 03/14/2021, 8:24 AM

## 2021-03-14 NOTE — Evaluation (Signed)
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   Arthritis    Breast cancer ([place]) 12/05/10   R breast, inv mammary, in situ,ER/PR +,HER2 -   Cancer ([place])    Carcinoma of breast treated with adjuvant chemotherapy ([place])    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[place])    Claudication ([place])    Coronary artery disease    stent 2007   Depression    Diabetes mellitus    Difficulty sleeping    Diverticulosis 12/10/03   Elevated cholesterol    Generalized weakness    GERD (gastroesophageal reflux disease)    Heart murmur    Hepatitis    as an infant   HSV-1 (herpes simplex virus 1) infection    Hyperlipidemia    Hypertension    Hypothyroidism    Osteoarthritis    PAD (peripheral artery disease) ([place])    Personal history of chemotherapy    Personal history of radiation therapy    Pneumonia    2014 september and March 2022   Rheumatoid arthritis ([place])    S/P aortic valve replacement with bioprosthetic valve 05/04/2020   [facility] Inspiris Resilia stented bovine pericardial tissue valve, size 23 mm   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   LIMA to D3, SVG to PDA, EVH via right thigh   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   Dr [name]   Spinal stenosis    Thyroid disease    Past Surgical History:  Past Surgical History:  Procedure Laterality Date   ABDOMINAL AORTOGRAM W/LOWER EXTREMITY Bilateral 04/29/2019    Procedure: ABDOMINAL AORTOGRAM W/LOWER EXTREMITY;  Surgeon: Marty Heck, MD;  Location: [facility];  Service: Cardiovascular;  Laterality: Bilateral;   ABDOMINAL AORTOGRAM W/LOWER EXTREMITY Left 07/01/2019   Procedure: ABDOMINAL AORTOGRAM W/ Left LOWER EXTREMITY [place];  Surgeon: Marty Heck, MD;  Location: [facility];  Service: Cardiovascular;  Laterality: Left;   ABDOMINAL AORTOGRAM W/LOWER EXTREMITY Left 03/03/2021   Procedure: ABDOMINAL AORTOGRAM W/LOWER EXTREMITY;  Surgeon: Cherre Robins, MD;  Location: [facility];  Service: Cardiovascular;  Laterality: Left;   AMPUTATION Left 03/06/2021   Procedure: LEFT ABOVE KNEE AMPUTATION;  Surgeon: [name], MD;  Location: [facility];  Service: Vascular;  Laterality: Left;   ANTERIOR AND POSTERIOR REPAIR N/A 04/05/2016   Procedure: ANTERIOR (CYSTOCELE) AND POSTERIOR REPAIR (RECTOCELE);  Surgeon: [name], MD;  Location: [facility];  Service: Gynecology;  Laterality: N/A;   AORTIC VALVE REPLACEMENT N/A 05/04/2020   Procedure: AORTIC VALVE REPLACEMENT (AVR) USING INSPIRIS 23MM RESILIA AORTIC VALVE;  Surgeon: Rexene Alberts, MD;  Location: [facility];  Service: Open Heart Surgery;  Laterality: N/A;   BREAST BIOPSY     BREAST LUMPECTOMY  1983   benign biopsy   BREAST LUMPECTOMY Right 12/29/2010   snbx, ER/PR +, Her2 -, 0/1 node  pos.   CARPAL TUNNEL RELEASE Bilateral 9/12,8,12   CHOLECYSTECTOMY     COLONOSCOPY N/A 02/09/2013   Procedure: COLONOSCOPY;  Surgeon: [name], MD;  Location: [facility];  Service: Endoscopy;  Laterality: N/A;   COLONOSCOPY     CORONARY ANGIOPLASTY     CORONARY ARTERY BYPASS GRAFT N/A 05/04/2020   Procedure: CORONARY ARTERY BYPASS GRAFTING (CABG), ON PUMP, TIMES TWO, USING LEFT INTERNAL MAMMARY ARTERY AND RIGHT ENDOSCOPICALLY HARVESTED GREATER SAPHENOUS VEIN;  Surgeon: Rexene Alberts, MD;  Location: [facility];  Service: Open Heart Surgery;  Laterality: N/A;   DILATION AND CURETTAGE OF  UTERUS     ESOPHAGOGASTRODUODENOSCOPY N/A 02/09/2013   Procedure: ESOPHAGOGASTRODUODENOSCOPY (EGD);  Surgeon: [name], MD;  Location: [name];  Service: Endoscopy;  Laterality: N/A;   FOOT SPURS     HYSTEROSCOPY WITH D & C N/A 09/11/2012   Procedure: DILATATION AND CURETTAGE ;  Surgeon: [name], MD;  Location: [facility];  Service: Gynecology;  Laterality: N/A;   KNEE SURGERY  2007   LAPAROSCOPIC ASSISTED VAGINAL HYSTERECTOMY N/A 04/05/2016   Procedure: LAPAROSCOPIC ASSISTED VAGINAL HYSTERECTOMY possible BSO;  Surgeon: [name], MD;  Location: [facility];  Service: Gynecology;  Laterality: N/A;   LUMBAR LAMINECTOMY/DECOMPRESSION MICRODISCECTOMY N/A 10/20/2014   Procedure: MICRO LUMBER DECOMPRESSION L3-4 L4-5;  Surgeon: [name], MD;  Location: [facility];  Service: Orthopedics;  Laterality: N/A;   LUMBAR LAMINECTOMY/DECOMPRESSION MICRODISCECTOMY Bilateral 10/13/2015   Procedure: MICRO LUMBAR DECOMPRESSION L5 - S1 AND REDO DECOMPRESSION L4 - L5 AND REMOVAL OF FACET CYST L4 - L5 2 LEVELS;  Surgeon: [name], MD;  Location: [facility];  Service: Orthopedics;  Laterality: Bilateral;   PERIPHERAL VASCULAR INTERVENTION Right 04/29/2019   Procedure: PERIPHERAL VASCULAR INTERVENTION;  Surgeon: Marty Heck, MD;  Location: [facility];  Service: Cardiovascular;  Laterality: Right;   PORT-A-CATH REMOVAL  04/17/2011   Procedure: REMOVAL PORT-A-CATH;  Surgeon: [name], MD;  Location: [facility];  Service: General;  Laterality: Left;   PORTACATH PLACEMENT  02/07/2011   Procedure: INSERTION PORT-A-CATH;  Surgeon: [name], MD;  Location[facility];  Service: General;  Laterality: N/A;   RIGHT/LEFT HEART CATH AND CORONARY ANGIOGRAPHY N/A 03/17/2020   Procedure: RIGHT/LEFT HEART CATH AND CORONARY ANGIOGRAPHY;  Surgeon: Burnell Blanks, MD;  Location: [facility];  Service: Cardiovascular;  Laterality: N/A;   TEE WITHOUT CARDIOVERSION N/A  05/04/2020   Procedure: TRANSESOPHAGEAL ECHOCARDIOGRAM (TEE);  Surgeon: Rexene Alberts, MD;  Location: [facility];  Service: Open Heart Surgery;  Laterality: N/A;   UPPER GASTROINTESTINAL ENDOSCOPY      Assessment & Plan Clinical Impression: Patient is a 81 y.o. year old female with recent admission to the hospital on  [name] is a 81 year old female with history of T2DM, polyneuropathy, breast cancer, CAD s/p CABG, CKD IV, PAD, recent Covid infection 01/2021, epiglottitis s/p steroids and antibiotics (Dr. Janace Hoard) who was admitted on 02/28/21 with BLE weakness with difficulty walking, fall, worsening of neuropathy, transient diplopia and found to have elevated troponin and positive H flu markers felt to be due to recent infection.  Elevated troponin felt to be due to decreased renal function and 2 D echo done revealing EF 55-60% with mild MVR and aortic replacement without leak. She was briefly treated with IV heparin and as patient asymptomatic and cardiology questioned demand ischemia and recommended medical management. MRI brain negative for acute changes. MRI thoracic and lumbar spine  showed  post op changes L3-S1 with moderate L4 and L4 foraminal stenosis and L2-L3 mild to moderate canal and left lateral recess stenosis and unremarkable thoracic spine.  Dr. Carlis Abbott consulted for input on non-healing recent left toe great wound and bilateral heel wounds with rest pain. LLE angiogram revealed diffuse disease throughout the leg without options for revascularization and she was agreeable to undergo L-AKA by Dr. Unk Lightning on 03/06/21. Post op had improvement in pain but continued to be limited by weakness with orthostatic changes,  tachycardia--HR up to 130's with ambulation as well as poorly controlled BS with ABLA. CIR recommended due to functional decline. Currently complains of weakness.   Patient transferred to CIR on 03/13/2021 .    Patient currently requires min with basic self-care skills secondary to  muscle weakness and decreased standing balance and decreased balance strategies.  Prior to hospitalization, patient could complete ADLs with independent .  Patient will benefit from skilled intervention to decrease level of assist with basic self-care skills and increase independence with basic self-care skills prior to discharge home with care partner.  Anticipate patient will require intermittent supervision and follow up home health.  OT - End of Session Activity Tolerance: Improving Endurance Deficit: Yes OT Assessment Rehab Potential (ACUTE ONLY): Excellent OT Patient demonstrates impairments in the following area(s): Balance;Pain OT Basic ADL's Functional Problem(s): Eating;Grooming;Bathing;Dressing;Toileting OT Advanced ADL's Functional Problem(s): Simple Meal Preparation OT Transfers Functional Problem(s): Tub/Shower;Toilet OT Additional Impairment(s): None OT Plan OT Intensity: Minimum of 1-2 x/day, 45 to 90 minutes OT Frequency: 5 out of 7 days OT Duration/Estimated Length of Stay: 7-9 days OT Treatment/Interventions: Teacher, English as a foreign language;Discharge planning;Functional mobility training;Community reintegration;Self Care/advanced ADL retraining;Therapeutic Exercise;Wheelchair propulsion/positioning;UE/LE Strength taining/ROM;Pain management;Patient/family education;Splinting/orthotics;UE/LE Coordination activities;Therapeutic Activities OT Self Feeding Anticipated Outcome(s): independent OT Basic Self-Care Anticipated Outcome(s): supervision OT Toileting Anticipated Outcome(s): supervision OT Bathroom Transfers Anticipated Outcome(s): supervision OT Recommendation Patient destination: Home Follow Up Recommendations: 24 hour supervision/assistance;Home health OT Equipment Recommended: 3 in 1 bedside comode;Tub/shower bench   OT Evaluation Precautions/Restrictions  Precautions Precautions: Fall;Other (comment) Precaution Comments: left  AKA Restrictions Weight Bearing Restrictions: Yes LLE Weight Bearing: Non weight bearing General   Vital Signs  Pain Pain Assessment Pain Scale: 0-10 Pain Score: 7  Pain Type: Phantom pain;Surgical pain;Acute pain Pain Location: Leg Pain Orientation: Left Pain Descriptors / Indicators: Other (Comment);Burning (phantom pain) Pain Onset: With Activity Pain Intervention(s): Rest;Medication (See eMAR);Relaxation;Emotional support;Distraction Home Living/Prior Functioning Home Living Living Arrangements: Spouse/significant other Available Help at Discharge: Family, Available 24 hours/day Type of Home: House Home Access: Stairs to enter [employer] of Steps: 5 STE back door BHRs can reach both, 1 STE front no HRs Home Layout: One level Bathroom Shower/Tub: Chiropodist: Handicapped height Bathroom Accessibility: Yes  Lives With: Spouse (husband, Engineer, agricultural) IADL History Homemaking Responsibilities: Yes Meal Prep Responsibility: Primary Laundry Responsibility: Primary Cleaning Responsibility: Primary Current License: Yes Occupation: Retired Prior Function Level of Independence: Independent with gait, Independent with transfers, Independent with homemaking with ambulation (without AD)  Able to Take Stairs?: Yes Driving: Yes Vision Baseline Vision/History: 1 Wears glasses;4 Cataracts (reading glasses) Ability to See in Adequate Light: 1 Impaired Patient Visual Report: No change from baseline Vision Assessment?: No apparent visual deficits Perception  Perception: Within Functional Limits Praxis Praxis: Intact Cognition Overall Cognitive Status: Within Functional Limits for tasks assessed Arousal/Alertness: Awake/alert Orientation Level: Person;Place;Situation Person: Oriented Place: Oriented Situation: Oriented Year: 2023 Month: February Day of Week: Correct Memory: Appears intact Immediate Memory Recall: Sock;Blue;Bed Memory Recall Sock:  Without Cue Memory Recall Blue: Without Cue Memory Recall Bed: Without Cue Awareness: Appears intact Problem Solving: Appears intact Safety/Judgment: Appears intact Sensation Sensation Light Touch: Impaired Detail Peripheral sensation comments: hx of peripheral neuropathy with decreased sensation distally Light Touch Impaired Details: Impaired RLE Hot/Cold: Not tested Proprioception: Appears Intact Stereognosis: Not tested Additional Comments: Sensation WFLs for BUEs with gross testing Coordination Gross Motor Movements are Fluid and Coordinated: yes Fine Motor Movements are Fluid and Coordinated: Yes Coordination and Movement Description:  Motor  Motor Motor: Within Functional Limits Motor - Skilled Clinical Observations: Generalized weakness  Trunk/Postural Assessment  Cervical Assessment Cervical Assessment: Exceptions to [facility] (forward head) Thoracic Assessment Thoracic Assessment: Exceptions to [facility] (slight thoracic rounding) Lumbar Assessment Lumbar Assessment: Exceptions to WFL (slight posterior pelvic tilt)  Balance Balance Balance Assessed: Yes Static Sitting Balance Static Sitting - Balance Support: Bilateral upper extremity supported Static Sitting - Level of Assistance: 7: Independent Dynamic Sitting Balance Dynamic Sitting - Balance Support: During functional activity Dynamic Sitting - Level of Assistance: 5: Stand by assistance Static Standing Balance Static Standing - Balance Support: During functional activity Static Standing - Level of Assistance: 4: Min assist Dynamic Standing Balance Dynamic Standing - Balance Support: During functional activity Dynamic Standing - Level of Assistance: 4: Min assist Extremity/Trunk Assessment RUE Assessment RUE Assessment: Within Functional Limits LUE Assessment LUE Assessment: Within Functional Limits  Care Tool Care Tool Self Care Eating        Oral Care         Bathing              Upper Body  Dressing(including orthotics)            Lower Body Dressing (excluding footwear)          Putting on/Taking off footwear             Care Tool Toileting Toileting activity         Care Tool Bed Mobility Roll left and right activity        Sit to lying activity        Lying to sitting on side of bed activity         Care Tool Transfers Sit to stand transfer        Chair/bed transfer         Toilet transfer         Care Tool Cognition  Expression of Ideas and Wants    Understanding Verbal and Non-Verbal Content     Memory/Recall Ability     Refer to Care Plan for Long Term Goals  SHORT TERM GOAL WEEK 1    Recommendations for other services: None    Skilled Therapeutic Intervention ADL ADL Eating: Independent Where Assessed-Eating: Chair Grooming: Setup Where Assessed-Grooming: Edge of bed Lower Body Bathing: Setup Where Assessed-Lower Body Bathing: Edge of bed Upper Body Dressing: Supervision/safety Where Assessed-Upper Body Dressing: Edge of bed Lower Body Dressing: Minimal assistance Where Assessed-Lower Body Dressing: Edge of bed Toileting: Minimal assistance Where Assessed-Toileting: Bedside Commode Toilet Transfer Method: Stand pivot Science writer: Radiographer, therapeutic: Not assessed Social research officer, government: Not assessed Mobility  Bed Mobility Bed Mobility: Supine to Sit Supine to Sit: Supervision/Verbal cueing Transfers Sit to Stand: Minimal Assistance - Patient > 75% Stand to Sit: Minimal Assistance - Patient > 75%  Session Note:  Pt transferred to the EOB with supervision and then worked on bathing and dressing tasks from EOB.  Min instructional cueing for hand  placement with sit to stand from the EOB during LB selfcare with use of the RW for support.  Therapist ace wrapped the left residual limb prior to dressing and after MD was able to look at the wound.  Min assist for stand pivot transfer to the  recliner to finish session with call button and phone in reach as well as safety alarm belt.   Discharge Criteria: Patient will be discharged from OT if patient refuses treatment 3 consecutive times without medical reason, if treatment goals not met, if there is a change in medical status, if patient makes no progress towards goals or if patient is discharged from hospital.  The above assessment, treatment plan, treatment alternatives and goals were discussed and mutually agreed upon: by patient  Havard Radigan OTR/L 03/14/2021, 11:29 AM

## 2021-03-15 LAB — GLUCOSE, CAPILLARY
Glucose-Capillary: 89 mg/dL (ref 70–99)
Glucose-Capillary: 101 mg/dL — ABNORMAL HIGH (ref 70–99)
Glucose-Capillary: 129 mg/dL — ABNORMAL HIGH (ref 70–99)
Glucose-Capillary: 250 mg/dL — ABNORMAL HIGH (ref 70–99)

## 2021-03-15 MED ORDER — METOPROLOL TARTRATE 50 MG PO TABS
50.0000 mg | ORAL_TABLET | Freq: Two times a day (BID) | ORAL | Status: DC
  Administered 2021-03-15 – 2021-03-18 (×6): 50 mg via ORAL
  Filled 2021-03-15 (×6): qty 1

## 2021-03-15 MED ORDER — NORTRIPTYLINE HCL 10 MG PO CAPS
10.0000 mg | ORAL_CAPSULE | Freq: Every day | ORAL | Status: DC
  Administered 2021-03-15 – 2021-03-17 (×3): 10 mg via ORAL
  Filled 2021-03-15 (×3): qty 1

## 2021-03-15 NOTE — Progress Notes (Signed)
Physical Therapy Session Note  Patient Details  Name: [name] MRN: [id number] Date of Birth: Feb 20, 1940  Today's Date: 03/15/2021 PT Individual Time: 7867-5449 PT Individual Time Calculation (min): 69 min   Short Term Goals: Week 1:  PT Short Term Goal 1 (Week 1): = to LTGs based on ELOS  Skilled Therapeutic Interventions/Progress Updates:    Pt received supine in bed resting and agreeable to therapy session. Supine>sitting R EOB, HOB slightly elevated but not using bedrail, with supervision and increased time and effort with verbal cuing for sequencing to increase pt independence. Sit>stand EOB>RW with CGA. L stand pivot using RW to w/c with CGA for steadying - cuing to step back fully prior to initiating sit.  Educated pt on w/c brake management. B UE w/c propulsion ~237ft to main therapy gym with supervision including backing onto elevator - requires verbal cuing for safety and sequencing UE propulsion to decrease risk of repetitive stress injury - pt noted to have increased difficulty navigating w/c in smaller/tighter spaces.  Educated patient on option of performing lateral scoot transfers as opposed to stand pivot transfers using RW to allow increased independence because pt will require family assistance to retrieve AD. Educated pt on proper set-up of wheelchair to mat including managing arm rest with min assist. R lateral scoot w/c>EOM with CGA for steadying and cuing for sequencing.   Educated pt on performing the following HEP and completed 1-2 sets of each lying down exercise with education to pt on proper form/technique. Educated pt to ensure she has her husband provide CGA while performing standing exercises maintain her safety.   Access Code: [email or web address] URL: [email or web address] Date: 03/15/2021 Prepared by: Page Spiro  Exercises Supine Single Leg Bridge with Sound Leg (AKA) - 1 x daily - 7 x weekly - 2 sets - 15 reps Supine Isometric Hip Adduction  with Towel Roll (AKA) - 1 x daily - 7 x weekly - 2 sets - 10 reps - 5 seconds hold Sidelying Hip Abduction (AKA) - 1 x daily - 7 x weekly - 3 sets - 10 reps Sidelying Hip Extension - 1 x daily - 7 x weekly - 2 sets - 10 reps Sidelying Hip Circles (AKA) - 1 x daily - 7 x weekly - 2 sets - 10 reps Prone Hip Extension with Residual Limb (AKA) - 1 x daily - 7 x weekly - 3 sets - 10 reps Standing Hip Abduction with Counter Support - 1 x daily - 7 x weekly - 2 sets - 20 reps Standing Hip Extension with Counter Support - 1 x daily - 7 x weekly - 2 sets - 20 reps  Pt is not to be able to extend L hip up off the mat while in prone, but is able to achieve increased hip extension ROM (with increased glute activation) while in R sidelying position. Reinforced education to pt on importance of maintaining full L hip extension ROM. Pt performed all bed mobility on the mat with supervision and increased time/effort transitioning to/from sitting with therapist cuing to increase pt independence and ease of movement.  Educated pt on [place] (flyer provided). General education regarding prosthetic process discussed. Educated pt on recommendation to receive and utilize a wheelchair upon D/C to allow modified independent functional mobility with decreased energy expenditure while pt awaiting prosthetic process.   L lateral scoot transfer EOM>w/c with CGA for steadying. Transported back to room and pt reports need to use bathroom. Sit>stand w/c>RW with CGA. Gait training ~  17ft x2 in/out bathroom using RW with CGA for steadying - pt demos decreased foot clearance while hopping today requiring cuing for improvement, likely due to fatigue. Continent of bladder and performed seated peri-care without assist.  Sit>supine supervision. Pt left supine in bed with needs in reach and bed alarm on.   Therapy Documentation Precautions:  Precautions Precautions: Fall, Other (comment) Precaution Comments: left  AKA Restrictions Weight Bearing Restrictions: Yes LLE Weight Bearing: Non weight bearing   Pain: Pt reports sudden onset of L LE phantom limb pain with certain movements - pt able to reposition limb and rest with distraction or emotional support for pain management throughout session. Pain unrated.     Therapy/Group: Individual Therapy  Tawana Scale , PT, DPT, NCS, CSRS  03/15/2021, 2:37 PM

## 2021-03-15 NOTE — Progress Notes (Signed)
Orthopedic Tech Progress Note Patient Details:  [name] 01-Jun-1940 [id number]  Called in order to [employer] for an AKA SHRINKER with waist band  Patient ID: [name], female   DOB: 07-24-1940, 81 y.o.   MRN: [id number]  [name] 03/15/2021, 10:56 AM

## 2021-03-15 NOTE — Progress Notes (Signed)
PROGRESS NOTE   Subjective/Complaints: Slept okay, husband at bedside, has phantom pain, discussed phantom pain versus stump pain  ROS- neg CP, SOB< N/V/D  Objective:   No results found. Recent Labs    03/14/21 0507  WBC 11.7*  HGB 10.9*  HCT 32.9*  PLT 428*    Recent Labs    03/14/21 0507  NA 136  K 5.3*  CL 101  CO2 26  GLUCOSE 138*  BUN 26*  CREATININE 1.44*  CALCIUM 9.2     Intake/Output Summary (Last 24 hours) at 03/15/2021 0954 Last data filed at 03/15/2021 0900 Gross per 24 hour  Intake 580 ml  Output 150 ml  Net 430 ml         Physical Exam: Vital Signs Blood pressure (!) 162/72, pulse 85, temperature 98 F (36.7 C), temperature source Oral, resp. rate 16, SpO2 95 %.  General: No acute distress Mood and affect are appropriate Heart: Regular rate and rhythm no rubs murmurs or extra sounds Lungs: Clear to auscultation, breathing unlabored, no rales or wheezes Abdomen: Positive bowel sounds, soft nontender to palpation, nondistended Extremities: No clubbing, cyanosis, or edema Skin: No evidence of breakdown, no evidence of rash Neurologic: Cranial nerves II through XII intact, motor strength is 5/5 in bilateral deltoid, bicep, tricep, grip, RIght hip flexor, knee extensors, ankle dorsiflexor and plantar flexor Sensory exam normal sensation to light touch and proprioception in bilateral upper and lower extremities  Musculoskeletal: Full range of motion in all 4 extremities. No joint swelling     Assessment/Plan: 1. Functional deficits which require 3+ hours per day of interdisciplinary therapy in a comprehensive inpatient rehab setting. Physiatrist is providing close team supervision and 24 hour management of active medical problems listed below. Physiatrist and rehab team continue to assess barriers to discharge/monitor patient progress toward functional and medical goals  Care  Tool:  Bathing              Bathing assist       Upper Body Dressing/Undressing Upper body dressing   What is the patient wearing?: Hospital gown only    Upper body assist Assist Level: Supervision/Verbal cueing    Lower Body Dressing/Undressing Lower body dressing      What is the patient wearing?: Hospital gown only     Lower body assist Assist for lower body dressing: Supervision/Verbal cueing     Toileting Toileting    Toileting assist Assist for toileting: Minimal Assistance - Patient > 75%     Transfers Chair/bed transfer  Transfers assist     Chair/bed transfer assist level: Minimal Assistance - Patient > 75% Chair/bed transfer assistive device: Armrests, Programmer, multimedia   Ambulation assist      Assist level: Minimal Assistance - Patient > 75% Assistive device: Walker-rolling Max distance: 33f   Walk 10 feet activity   Assist     Assist level: Minimal Assistance - Patient > 75% Assistive device: Walker-rolling   Walk 50 feet activity   Assist Walk 50 feet with 2 turns activity did not occur: Safety/medical concerns         Walk 150 feet activity   Assist Walk 150  feet activity did not occur: Safety/medical concerns         Walk 10 feet on uneven surface  activity   Assist Walk 10 feet on uneven surfaces activity did not occur: Safety/medical concerns         Wheelchair     Assist Is the patient using a wheelchair?: Yes Type of Wheelchair: Manual    Wheelchair assist level: Supervision/Verbal cueing, Set up assist Max wheelchair distance: 131f    Wheelchair 50 feet with 2 turns activity    Assist        Assist Level: Supervision/Verbal cueing   Wheelchair 150 feet activity     Assist      Assist Level: Moderate Assistance - Patient 50 - 74%   Blood pressure (!) 162/72, pulse 85, temperature 98 F (36.7 C), temperature source Oral, resp. rate 16, SpO2 95 %.  Medical  Problem List and Plan: 1. 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 Formulized individual treatment plan based on medical history, underlying problem and comorbidities.  2.  Impaired mobility -DVT/anticoagulation:  Pharmaceutical: Heparin             -antiplatelet therapy: continue aspirin 872m  3. Postoperative pain: Hydrocodone prn. Continue vitamin C 25081mID.  Nortriptyline 6m25ms 4. Mood: LCSW to follow for evaluation and support.              -antipsychotic agents: N/A 5. Neuropsych: This patient is capable of making decisions on her own behalf. 6. Skin/Wound Care: Routine pressure relief measures.              --Add vitamin C and Zinc. AKA incision looks great 7. Fluids/Electrolytes/Nutrition: Monitor I/O. Check CMET in am.  8. T2DM: Monitor BS ac/hs. Conitnue insulin glargine 26 units BID--discontinue ensure supplements between meals.  --Will add juven and Ensure max instead of Ensure Enlive to help with BS control/wound healing.              --resume ozempic  and glucotrol--> daughter to bring in ozempic. Will likely need meal coverage and insulin glargine decreased as home meds are resumed.  CBG (last 3)  Recent Labs    03/14/21 1623 03/14/21 2039 03/15/21 0616  GLUCAP 104* 92 89     9. HTN: Monitor BP TID Vitals:   03/14/21 2007 03/15/21 0306  BP: (!) 153/70 (!) 162/72  Pulse: 92 85  Resp: 16 16  Temp: 98.1 F (36.7 C) 98 F (36.7 C)  SpO2: 96% 95%  Increase Lopressor 10. CAD s/p CABG: Monitor for symptoms with increase in activity.             --Continue ASA, BB and  11. RA: Managed with infusion every 2 months (Dr. HawkTrudie Reed          --aware of need to hold medication to help promote  healing.  12.  Depression: Managed with Paxil at current dose.  13. Constipation: continue colace 100mg54m    LOS: 2 days A FACE TO FACE EVALUATION WAS PERFORMED  AndreCharlett Blake/2023, 9:54 AM

## 2021-03-15 NOTE — Progress Notes (Signed)
Patient ID: LAIYLA SLAGEL, female   DOB: 1940-06-11, 81 y.o.   MRN: [id number]  Team Conference Report to Patient/Family  Team Conference discussion was reviewed with the patient and caregiver, including goals, any changes in plan of care and target discharge date.  Patient and caregiver express understanding and are in agreement.  The patient has a target discharge date of 03/18/21.  Sw met with patient and spouse, providing team conference updates. Patient agreeable to [facility] reccs. Patient will need a TTB and Drop Arm Commode. Pt will eval patient for temporary WC until patient ready for prosthetic. No additional questions or concerns, sw will continue to follow up.    Dyanne Iha 03/15/2021, 1:55 PM

## 2021-03-15 NOTE — Progress Notes (Signed)
Patient ID: [name], female   DOB: 12-08-1940, 81 y.o.   MRN: [id number]  Rolling Walker, Transfer Bench and Drop arm commode ordered through [employer].

## 2021-03-15 NOTE — Patient Care Conference (Signed)
Inpatient RehabilitationTeam Conference and Plan of Care Update Date: 03/15/2021   Time: 10:42 AM    Patient Name: [name]      Medical Record Number: [id number]  Date of Birth: 05/17/1940 Sex: Female         Room/Bed: 2Z30Q/7M22Q-33 Payor Info: Payor: [employer] MEDICARE / Plan: [facility] MEDICARE / Product Type: *No Product type* /    Admit Date/Time:  03/13/2021  2:26 PM  Primary Diagnosis:  Above knee amputation of left lower extremity [facility])  Hospital Problems: Principal Problem:   Above knee amputation of left lower extremity [facility])    Expected Discharge Date: Expected Discharge Date: 03/18/21  Team Members Present: Physician leading conference: Dr. Alysia Penna Social Worker Present: Erlene Quan, BSW Nurse Present: Dorien Chihuahua, RN PT Present: Page Spiro, PT OT Present: Clyda Greener, OT PPS Coordinator present : Gunnar Fusi, SLP     Current Status/Progress Goal Weekly Team Focus  Bowel/Bladder   cont of B/B , last BM-2/20  remain cont.  assess q shift and PRN   Swallow/Nutrition/ Hydration             ADL's   Supervision for UB selfcare with min assist for LB selfcare and transfers with use of the RW.  supervision to modified independent  selfcare retraining, transfer retraining, therapeutic activities, therapeutic exercise, balance retraining, DME education, pt education   Mobility   supervision bed mobility, CGA/min assist sit<>stand and stand pivot transfers using RW, min assist gait up to 79ft using RW, supervision w/c mobility up to 121ft  supervision overall with household ambulation and mod-I wheelchair mobility  activity tolerance, transfer training, gait training, DME education and training, pt education, standing balance, limb loss education, wheelchair mobility training   Communication             Safety/Cognition/ Behavioral Observations            Pain   pain 9of 10- phantom pain  pain<3  assess pain q shift and PRN    Skin   surgical incision- L AKA- staples  proper healing, no sign of infection  assess skin q shift and PRN     Discharge Planning:  discharging home with spouse and daughter. Providing 24/7 supervision   Team Discussion: Patient doing well overall. Reports phantom pain; MD to address. Patient limited by fatigue with hop 2 pattern for transfers.  Patient on target to meet rehab goals: yes, currently needs supervision for upper body care seated. Needs min assist for lower body care. Completes stand pivot transfers with min assist and able to ambulate up to 15' with min assist.  *See Care Plan and progress notes for long and short-term goals.   Revisions to Treatment Plan:  Shrinker for limb   Teaching Needs: Safety, transfers, medication management, secondary risk management, skin care, etc   Current Barriers to Discharge: Decreased caregiver support  Possible Resolutions to Barriers: Family education with spouse and daughter DME: RW, DA- BSC, TTB HH follow up services recommended     Medical Summary Current Status: phantom pain L AKA, elevated BP  Barriers to Discharge: Medical stability   Possible Resolutions to Barriers/Weekly Focus: initiate meds for phantom pain,needs tump shrinker   Continued Need for Acute Rehabilitation Level of Care: The patient requires daily medical management by a physician with specialized training in physical medicine and rehabilitation for the following reasons: Direction of a multidisciplinary physical rehabilitation program to maximize functional independence : Yes Medical management of patient stability for  increased activity during participation in an intensive rehabilitation regime.: Yes Analysis of laboratory values and/or radiology reports with any subsequent need for medication adjustment and/or medical intervention. : Yes   I attest that I was present, lead the team conference, and concur with the assessment and plan of the  team.   Dorien Chihuahua B 03/15/2021, 4:03 PM

## 2021-03-15 NOTE — Progress Notes (Signed)
Patient ID: [name], female   DOB: Sep 09, 1940, 81 y.o.   MRN: [id number]  Family education scheduled on 2/24. 9-11

## 2021-03-15 NOTE — Progress Notes (Signed)
Occupational Therapy Session Note  Patient Details  Name: [name] MRN: [id number] Date of Birth: [birth year]  Today's Date: 03/15/2021 OT Individual Time: 0802-0916 OT Individual Time Calculation (min): 74 min    Short Term Goals: Week 1:  OT Short Term Goal 1 (Week 1): STGs equal to LTGs set a supervision to modified independent based on LOS.  Skilled Therapeutic Interventions/Progress Updates:    Session 1: (2671-2458)  Pt in bed to start session already dressed for session.  Pain rated at 3/10 to start.  Therapist had her push down her pants in supine in order to provide ace wrapping for the left residual limb.  She then transferred to sitting with supervision and donned her lace up shoe with setup as well.  She was able to then stand with min guard assist to complete pulling pants over hips, alternating UEs.  She was able to complete transfer stand pivot to the wheelchair at the same level for work on grooming tasks at the sink.  She was able to complete oral hygiene, combing her hair, and washing her face with setup.  She next was taken down to the therapy gym via wheelchair where she worked on [place] and endurance with use of the UE ergonometer.  She was able to complete 3 sets of 5 mins on level 7 resistance on Random Program Setting.  HR at 85 with O2 sats at 97% on room air.   She was given 1-2 min rest breaks between each set with RPMs maintained at level 20-25.  Once complete, therapist took her back to the room and she transferred back to the bed at min guard.  Pt's spouse in the room at end of session with the call button and phone in reach.    Session 2: (1340-1435) Pt in bed to start session.  Provided total assist with donning new AKA shrinker prior to transfer to the EOB.  She was able to complete donning her pants EOB and her shoe on the right foot with min assist sit to stand.  She was able to complete stand pivot transfer to the wheelchair as well with mod  instructional cueing for hand placement on the RW for support.  She was taken down to the dayroom via wheelchair where rest of session focused on sit to stand transitions and standing balance while engaged in Wii bowling activity to assist with balance for LB selfcare tasks.  She was able to complete several sit to stand transitions with min guard assist and use the RUE for activity while sustaining standing balance at min guard.  When integrating the LUE, she needed min assist to maintain balance.  After completion of task, therapist took her back to the room where she was able to complete transfer back to the bed at min guard assist.  Call button and phone in reach with safety alarm bed in place.    Therapy Documentation Precautions:  Precautions Precautions: Fall, Other (comment) Precaution Comments: left AKA Restrictions Weight Bearing Restrictions: Yes LLE Weight Bearing: Non weight bearing  Pain: Pain Assessment Pain Scale: 0-10 Pain Score: 3  Pain Type: Surgical pain Pain Location: Leg Pain Orientation: Left Pain Descriptors / Indicators: Discomfort Pain Onset: With Activity Pain Intervention(s): Medication (See eMAR)    Therapy/Group: Individual Therapy  Haim Hansson OTR/L 03/15/2021, 8:35 AM

## 2021-03-16 LAB — BASIC METABOLIC PANEL
Anion gap: 11 (ref 5–15)
BUN: 29 mg/dL — ABNORMAL HIGH (ref 8–23)
CO2: 23 mmol/L (ref 22–32)
Calcium: 8.8 mg/dL — ABNORMAL LOW (ref 8.9–10.3)
Chloride: 101 mmol/L (ref 98–111)
Creatinine, Ser: 1.5 mg/dL — ABNORMAL HIGH (ref 0.44–1.00)
GFR, Estimated: 35 mL/min — ABNORMAL LOW (ref >60)
Glucose, Bld: 90 mg/dL (ref 70–99)
Potassium: 4 mmol/L (ref 3.5–5.1)
Sodium: 135 mmol/L (ref 135–145)

## 2021-03-16 LAB — GLUCOSE, CAPILLARY
Glucose-Capillary: 159 mg/dL — ABNORMAL HIGH (ref 70–99)
Glucose-Capillary: 84 mg/dL (ref 70–99)
Glucose-Capillary: 101 mg/dL — ABNORMAL HIGH (ref 70–99)
Glucose-Capillary: 140 mg/dL — ABNORMAL HIGH (ref 70–99)

## 2021-03-16 MED ORDER — INSULIN GLARGINE-YFGN 100 UNIT/ML ~~LOC~~ SOLN
15.0000 [IU] | Freq: Two times a day (BID) | SUBCUTANEOUS | Status: DC
  Administered 2021-03-16 – 2021-03-18 (×4): 15 [IU] via SUBCUTANEOUS
  Filled 2021-03-16 (×5): qty 0.15

## 2021-03-16 MED ORDER — INSULIN GLARGINE-YFGN 100 UNIT/ML ~~LOC~~ SOLN
10.0000 [IU] | Freq: Two times a day (BID) | SUBCUTANEOUS | Status: DC
  Filled 2021-03-16: qty 0.1

## 2021-03-16 NOTE — Progress Notes (Signed)
Patient ID: [name], female   DOB: 1941-01-09, 81 y.o.   MRN: [id number]  Medicare [place] resource list provided to patient/family.

## 2021-03-16 NOTE — Discharge Summary (Signed)
Physician Discharge Summary  Patient ID: [name] MRN: [id number] DOB/AGE: 07/23/1940 81 y.o.  Admit date: 03/13/2021 Discharge date: 03/18/2021  Discharge Diagnoses:  Principal Problem:   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[place]) Active Problems:   Type 2 diabetes mellitus with diabetic neuropathy, without long-term current use of insulin (HCC)   Diabetic peripheral neuropathy (HCC)   Sinus tachycardia   Discharged Condition: stable  Significant Diagnostic Studies: N/a   Labs:  Basic Metabolic Panel: Recent Labs  Lab 03/12/21 0132 03/14/21 0507 03/16/21 1135  NA 133* 136 135  K 4.7 5.3* 4.0  CL 100 101 101  CO2 '23 26 23  ' GLUCOSE 222* 138* 90  BUN 31* 26* 29*  CREATININE 1.26* 1.44* 1.50*  CALCIUM 8.8* 9.2 8.8*  MG 1.8  --   --   PHOS 3.2  --   --     CBC: Recent Labs  Lab 03/11/21 0152 03/12/21 0132 03/14/21 0507  WBC 8.9 9.4 11.7*  NEUTROABS  --   --  6.4  HGB 9.2* 10.0* 10.9*  HCT 29.8* 31.6* 32.9*  MCV 94.3 92.9 91.9  PLT 346 373 428*    CBG: Recent Labs  Lab 03/16/21 1627 03/16/21 2122 03/17/21 0553 03/17/21 1233 03/17/21 1614  GLUCAP 101* 140* 90 122* 136*    Brief HPI:   [name] is a 81 y.o. female with history of T2DM with polyneuropathy, breast cancer, CAD, CKD 4, recent COVID infection 01/2021 who was admitted on 02/28/2021 with BLE weakness with difficulty walking, fall, reports of worsening of neuropathy with transient diplopia.  She was found to have elevated troponin and positive H. influenzae markers due to recent infection.  2D echo done showing EF 55 to 60%.  She was briefly treated with IV heparin and as patient is symptomatic cardiology recommended medical management.  MRI brain was negative for acute changes.   She was found to have nonhealing ulcer on left great toe with bilateral heel wounds and rest pain.  LLE angiogram revealed diffuse disease throughout leg without options for revascularization and she  was agreeable to undergo left-AKA by Dr. Unk Lightning on 02/13.  Postop had improvement in pain but continued to be limited by weakness with orthostatic changes, tachycardia and was noted to have decline in functional status.  CIR was recommended due to functional decline.   Hospital Course: [name] was admitted to rehab 03/13/2021 for inpatient therapies to consist of PT and OT at least three hours five days a week. Past admission physiatrist, therapy team and rehab RN have worked together to provide customized collaborative inpatient rehab. Blood pressures/HR  were monitored on TID basis and BP/HR was noted to be elevated therefore metoprolol was increased to 50  mg BID. Her po intake has been good and her diabetes has been monitored with ac/hs CBG checks with SSI was use prn for tighter BS control. Glucotrol and ozempic were resumed and BS have improved with increase in activity. Insulin has been titrated during her stay and she was advised on continuing current regimen and to follow up with PCP for further adjustment.   Serial labs showed SCr trending back to baseline and ABLA is stable. Reactive leucocytosis has improved from 14.3 but she continues to have mild elevation without signs of infection. L- AKA incision is C/D/I and is healing well without s/s of infection. Pain is controlled with prn use of hydrocodone and she was educated on importance of taper over time. She made  steady gains during her stay. She has progressed to supervision with activity and ADLs. She will continue to receive follow up [place] by [facility] after discharge.    Rehab course: During patient's stay in rehab team conferences were held to monitor patient's progress, set goals and discuss barriers to discharge. At admission, patient required min assist with basic ADL tasks and with mobility.  She  has had improvement in activity tolerance, balance, postural control as well as ability to compensate for  deficits. She is able to complete ADL tasks with supervision. She is able to complete transfers with supervision and is using WC at modified independent level.     Disposition: Home  Diet: Heart Healthy Carb modified.   Special Instructions: Monitor BS ac/hs and follow up with PCP for adjustment of insulin.  2.  Recommend repeat CBC in 5-7 days to follow ABLA and WBC.    Allergies as of 03/17/2021       Reactions   Codeine Nausea And Vomiting, Other (See Comments)   Severe stomach cramps, also   Antihistamines, Diphenhydramine-type Other (See Comments)   Causes hyperactivity   Gabapentin    Urinary incontinence   Lyrica [pregabalin]    Double vision/falls   Oxycodone    Hallucinations/crazy feelings   Statins Other (See Comments)   Joint pains   Sulfa Antibiotics Other (See Comments)   Reaction not recalled   Erythromycin Hives, Rash, Other (See Comments)   Allergic due to dental work from 50 years ago. It was used in packing and resulted in rash/hives inside and outside of mouth.   Trulicity [dulaglutide] Itching        Medication List     STOP taking these medications    feeding supplement Liqd   fluconazole 150 MG tablet Commonly known as: DIFLUCAN   insulin aspart 100 UNIT/ML injection Commonly known as: novoLOG   Jardiance 10 MG Tabs tablet Generic drug: empagliflozin   losartan 50 MG tablet Commonly known as: COZAAR   pregabalin 75 MG capsule Commonly known as: Lyrica   senna-docusate 8.6-50 MG tablet Commonly known as: Senokot-S       TAKE these medications    Accu-Chek FastClix Lancets Misc USE TO CHECK BLOOD SUGARS 3 TIMES DAILY   acetaminophen 500 MG tablet Commonly known as: TYLENOL Take 500 mg by mouth every 6 (six) hours as needed for mild pain or headache.   ascorbic acid 250 MG tablet Commonly known as: VITAMIN C Take 1 tablet (250 mg total) by mouth 2 (two) times daily.   aspirin EC 81 MG tablet Take 81 mg by mouth in the  morning.   blood glucose meter kit and supplies Dispense based on patient and insurance preference. Use up to four times daily as directed. (FOR ICD-10 E10.9, E11.9).   Blood Glucose Monitoring Suppl Devi by Does not apply route. Accu Check Guide   calcium carbonate 500 MG chewable tablet Commonly known as: TUMS - dosed in mg elemental calcium Chew 3 tablets by mouth at bedtime.   CareFine Pen Needles 32G X 6 MM Misc Generic drug: Insulin Pen Needle 1 application by Does not apply route 2 (two) times daily.   docusate sodium 100 MG capsule Commonly known as: COLACE Take 1 capsule (100 mg total) by mouth 2 (two) times daily.   glipiZIDE 10 MG 24 hr tablet Commonly known as: GLUCOTROL XL Take 1 tablet (10 mg total) by mouth daily with breakfast.   glucose blood  test strip 1 each by Other route as needed for other. Use as instructed  Please dispense Accu check Guide Test strips   Accu-Chek Guide test strip Generic drug: glucose blood TEST ONCE DAILY   HYDROcodone-acetaminophen 7.5-325 MG tablet--Rx # 28 pills.  Commonly known as: NORCO Take 1 tablet by mouth every 6 (six) hours as needed for severe pain. What changed: reasons to take this   icosapent Ethyl 1 g capsule Commonly known as: Vascepa Take 2 capsules (2 g total) by mouth 2 (two) times daily.   insulin glargine 100 UNIT/ML Solostar Pen Commonly known as: LANTUS Inject 15 Units into the skin 2 (two) times daily. What changed:  how much to take when to take this   levothyroxine 100 MCG tablet Commonly known as: SYNTHROID Take 100 mcg by mouth daily before breakfast.   metoprolol tartrate 50 MG tablet Commonly known as: LOPRESSOR Take 1 tablet (50 mg total) by mouth 2 (two) times daily.   multivitamin with minerals Tabs tablet Take 1 tablet by mouth daily.   nortriptyline 10 MG capsule Commonly known as: PAMELOR Take 1 capsule (10 mg total) by mouth at bedtime.   Ozempic (0.25 or 0.5 MG/DOSE) 2  MG/1.5ML Sopn Generic drug: Semaglutide(0.25 or 0.5MG/DOS) Inject 0.5 mg into the skin once a week. What changed: when to take this   pantoprazole 40 MG tablet Commonly known as: PROTONIX Take 1 tablet (40 mg total) by mouth daily before breakfast.   PARoxetine 20 MG tablet Commonly known as: PAXIL Take 1 tablet (20 mg total) by mouth in the morning.   Repatha SureClick 469 MG/ML Soaj Generic drug: Evolocumab Inject 1 pen into the skin every 14 (fourteen) days. What changed: how much to take   Greene IV Inject into the vein every 2 (two) months.   traMADol 50 MG tablet Commonly known as: ULTRAM Take 1 tablet (50 mg total) by mouth every 12 (twelve) hours.   traZODone 50 MG tablet Commonly known as: DESYREL Take 0.5 tablets (25 mg total) by mouth at bedtime as needed for sleep.   valACYclovir 1000 MG tablet Commonly known as: VALTREX Take 1 tablet (1,000 mg total) by mouth daily as needed (Fever Blisters).   Vitamin D3 25 MCG (1000 UT) Caps Take 2,000 Units by mouth daily before lunch.   zinc sulfate 220 (50 Zn) MG capsule Take 1 capsule (220 mg total) by mouth daily. Start taking on: March 18, 2021        Follow-up Information     Kirsteins, Luanna Salk, MD Follow up.   Specialty: Physical Medicine and Rehabilitation Why: office will call you with follow up appointment Contact information: [place] [address] [phone]         [name], MD. Call.   Specialty: Family Medicine Why: for post hospital follow u Contact information: [place] [address] [phone]         [facility] Follow up on 04/05/2021.   Why: follow up appointment Contact information: [address] [place] [phone]                Signed: Bary Leriche 03/17/2021, 5:51 PM

## 2021-03-16 NOTE — Progress Notes (Signed)
Physical Therapy Session Note  Patient Details  Name: [name] MRN: [id number] Date of Birth: Jul 06, 1940  Today's Date: 03/16/2021 PT Individual Time: 1310-1424 PT Individual Time Calculation (min): 74 min   Short Term Goals: Week 1:  PT Short Term Goal 1 (Week 1): = to LTGs based on ELOS  Skilled Therapeutic Interventions/Progress Updates:    Pt received supine in bed, resting and upon awakening agreeable to therapy session though reporting fatigue. Educated pt on focus of therapy session to complete custom manual wheelchair evaluation as she will not be an independent functional ambulator upon D/C - pt in agreement. Supine>sitting R EOB with supervision. Sitting EOB maintains balance mod-I. L squat pivot EOB>w/c with CGA for safety.   Erlene Quan, ATP present for wheelchair consult. Therapist discussed with patient and ATP the following recommendations: - need for a custom ultra-lightweight manual wheelchair to provide pt mod-I functional mobility - need for an ultra-lightweight w/c with adjustable axles to promote optimal UE alignment to avoid shoulder injury and improve pt access to wheels, especially due to her B UE arthritis - need for a tension adjustable back to promote improved spine alignment due to tendency to sit in posterior pelvic tilt with excessive thoracic kyphosis  Pt in agreement with recommendations and reports no questions/concerns at this time. ATP planning to deliver loaner wheelchair tomorrow/Friday around 2:00PM.  Therapist provided pt with a donning tube for L LE shrinker. Pt reports need to use bathroom. Therapist educated pt on setting up wheelchair at a 90degree angle to the [facility] over the toilet (pt initially tries to set-up wheelchair facing the toilet, which would require her to complete a 180 degree turn). R stand pivot using UE support on w/c and BSC armrests as well as therapist as needed with CGA/light min assist. Standing with CGA using R UE support on grab  bar, pt doffed LB clothing without assist. Continent of bladder and peri-care without assist. L stand pivot back to w/c with light min assist for balance and B UE support on armrests. At end of session, pt left seated in w/c in the care of the nurse.   Therapy Documentation Precautions:  Precautions Precautions: Fall, Other (comment) Precaution Comments: left AKA Restrictions Weight Bearing Restrictions: Yes LLE Weight Bearing: Non weight bearing   Pain:  Reports having more phantom limb pain ever since she started wearing the shrinker - declines medication administration during session - therapist utilized distraction, emotional support,and repositioning for pain management.   Therapy/Group: Individual Therapy  Tawana Scale , PT, DPT, NCS, CSRS  03/16/2021, 7:59 PM

## 2021-03-16 NOTE — Discharge Summary (Signed)
Physical Therapy Discharge Summary  Patient Details  Name: [name] MRN: [id number] Date of Birth: 1940-12-31   Patient has met 3 of 12 long term goals due to improved activity tolerance, improved postural control, increased strength, decreased pain, and ability to compensate for deficits.  Patient to discharge at a wheelchair level requiring CGA assist for transfers CGA .   Patient's care partner is independent to provide the necessary physical assistance at discharge.  Reasons goals not met: Patient discharging sooner than anticipated at initial evaluation based on her CLOF and family support.  Recommendation:  Patient will benefit from ongoing skilled PT services in home health setting to continue to advance safe functional mobility, address ongoing impairments in R LE strengthening, L LE strengthening, dynamic standing balance, gait training and gait endurance, limb loss education and pre-prosthetic training, and minimize fall risk.  Equipment: Custom wheelchair through [employer], pt already has RW  Reasons for discharge: treatment goals met and discharge from hospital  Patient/family agrees with progress made and goals achieved: Yes  PT Discharge Precautions/Restrictions Precautions Precautions: Fall;Other (comment) Precaution Comments: left AKA Restrictions Weight Bearing Restrictions: Yes LLE Weight Bearing: Non weight bearing Pain  Pain Assessment Pain Scale: 0-10 Pain Score: 0-No pain Pain Interference  Pain Interference Pain Effect on Sleep: 0. Does not apply - I have not had any pain or hurting in the past 5 days Pain Interference with Therapy Activities: 0. Does not apply - I have not received rehabilitationtherapy in the past 5 days Pain Interference with Day-to-Day Activities: 1. Rarely or not at all Vision/Perception  Perception Perception: Within Functional Limits Praxis Praxis: Intact  Cognition Overall Cognitive Status: Within Functional Limits for  tasks assessed Arousal/Alertness: Awake/alert Orientation Level: Oriented X4 Year: 2023 Month: February Day of Week: Correct Attention: Focused;Selective;Sustained Focused Attention: Appears intact Sustained Attention: Appears intact Selective Attention: Appears intact Memory: Appears intact Awareness: Appears intact Safety/Judgment: Appears intact Sensation Sensation Light Touch: Impaired Detail Peripheral sensation comments: hx of peripheral neuropathy with decreased sensation distally in RLE Light Touch Impaired Details: Impaired RLE Hot/Cold: Not tested Proprioception: Appears Intact Stereognosis: Not tested Coordination Gross Motor Movements are Fluid and Coordinated: Yes Motor  Motor Motor: Other (comment) Motor - Discharge Observations: Generalized weakness and deconditioning  Mobility  Bed Mobility Bed Mobility: Supine to Sit;Sit to Supine;Rolling Left;Rolling Right Rolling Right: Independent Rolling Left: Independent Supine to Sit: Supervision/Verbal cueing Sit to Supine: Supervision/Verbal cueing Transfers Transfers: Sit to Stand;Stand to Sit;Stand Pivot Transfers Sit to Stand: Contact Guard/Touching assist Stand to Sit: Contact Guard/Touching assist Stand Pivot Transfers: Contact Guard/Touching assist Transfer (Assistive device): Rolling walker Locomotion  Gait Ambulation: Yes Gait Assistance: Contact Guard/Touching assist Gait Distance (Feet): 15 Feet Assistive device: Rolling walker Gait Assistance Details: Manual facilitation for weight shifting;Verbal cues for gait pattern;Verbal cues for technique;Verbal cues for precautions/safety;Verbal cues for safe use of DME/AE;Tactile cues for sequencing;Tactile cues for posture;Tactile cues for weight shifting;Tactile cues for placement;Tactile cues for weight beaing;Visual cues for safe use of DME/AE Gait Gait: Yes Gait Pattern: Impaired Gait Pattern: Poor foot clearance - right Gait velocity:  decreased Stairs / Additional Locomotion Stairs: No Wheelchair Mobility Wheelchair Mobility: Yes Wheelchair Assistance: Chartered loss adjuster: Both upper extremities Wheelchair Parts Management: Supervision/cueing Distance: 100  Trunk/Postural Assessment  Cervical Assessment Cervical Assessment: Exceptions to WFL (slight forward head) Thoracic Assessment Thoracic Assessment: Exceptions to [facility] (slight thoracic rounding) Lumbar Assessment Lumbar Assessment: Exceptions to WFL (slight posterior pelvic tilt in sitting)  Balance Balance Balance Assessed: Yes  Static Sitting Balance Static Sitting - Balance Support: Bilateral upper extremity supported Static Sitting - Level of Assistance: 7: Independent Dynamic Sitting Balance Dynamic Sitting - Balance Support: During functional activity Dynamic Sitting - Level of Assistance: 6: Modified independent (Device/Increase time) Static Standing Balance Static Standing - Balance Support: During functional activity;Bilateral upper extremity supported Static Standing - Level of Assistance: 5: Stand by assistance;Other (comment) Dynamic Standing Balance Dynamic Standing - Balance Support: During functional activity;Bilateral upper extremity supported Dynamic Standing - Level of Assistance:  (CGA) Extremity Assessment      RLE Assessment RLE Assessment: Exceptions to [facility] Active Range of Motion (AROM) Comments: WFL/WNL General Strength Comments: assessed in sitting RLE Strength Right Hip Flexion: 3+/5 Right Knee Flexion: 4-/5 Right Knee Extension: 4/5 Right Ankle Dorsiflexion: 4-/5 Right Ankle Plantar Flexion: 4-/5 LLE Assessment LLE Assessment: Exceptions to [facility] Active Range of Motion (AROM) Comments: lacks full hip extension ROM in standing and prone but otherwise [facility] General Strength Comments: no resistance applied due to pain with pt demonstrating at least 3/5 strength LLE Strength Left Hip Flexion: 3/5 Left  Hip Extension: 3/5 Left Hip ABduction: 3/5 Left Hip ADduction: 3/5    Carly Francis Dowse , PT, DPT, NCS, CSRS Estevan Ryder, PT, DPT, CBIS  03/16/2021, 8:11 PM

## 2021-03-16 NOTE — Discharge Instructions (Addendum)
Inpatient Rehab Discharge Instructions  [name] Discharge date and time: No discharge date for patient encounter.   Activities/Precautions/ Functional Status: Activity: no lifting, driving, or strenuous exercise for till cleared by MD Diet: diabetic diet Wound Care: keep wound clean and dry. Contact MD if you develop any problems with your incision/wound--redness, swelling, increase in pain, drainage or if you develop fever or chills.     Functional status:  ___ No restrictions     ___ Walk up steps independently _X__ 24/7 supervision/assistance   ___ Walk up steps with assistance ___ Intermittent supervision/assistance  ___ Bathe/dress independently ___ Walk with walker     ___ Bathe/dress with assistance ___ Walk Independently    ___ Shower independently ___ Walk with assistance    ___ Shower with assistance _X__ No alcohol     ___ Return to work/school ________   Special Instructions: Check blood sugars 2-4 times a day and follow up with PCP for input on diabetic medications.     COMMUNITY REFERRALS UPON DISCHARGE:    Home Health:   PT    RN                      Agency: [facility]  Phone: [phone] Start of Care Date: 2/26   Medical Equipment/Items Ordered: Rolling Walker, Tub Transfer Bench, Drop Arm Commode                                                 Agency/Supplier: [employer] [phone]   My questions have been answered and I understand these instructions. I will adhere to these goals and the provided educational materials after my discharge from the hospital.  Patient/Caregiver Signature _______________________________ Date __________  Clinician Signature _______________________________________ Date __________  Please bring this form and your medication list with you to all your follow-up doctor's appointments.

## 2021-03-16 NOTE — Progress Notes (Addendum)
Occupational Therapy Session Note  Patient Details  Name: [name] MRN: [id number] Date of Birth: 10-May-1940  Today's Date: 03/16/2021 OT Individual Time: 0803-0900 OT Individual Time Calculation (min): 57 min    Short Term Goals: Week 1:  OT Short Term Goal 1 (Week 1): STGs equal to LTGs set a supervision to modified independent based on LOS.  Skilled Therapeutic Interventions/Progress Updates:    Session 1:  [phone])  Pt was in the bed to start session.  She was able to transfer to sitting with supervision and then donn her shoe with setup.  Min guard transfer was completed to the wheelchair squat pivot with education on wheelchair setup and placement prior to transfer.  She then rolled herself part of the way down to the elevators and therapy gym with increased time and 2 rest breaks secondary to fatigue.  She was able to complete transfer to the therapy mat with min guard assist as well squat pivot without an assistive device.  She then completed BUE strengthening exercises with use of the medium resistance therapy band.  She was able to complete 1 set of 10 reps for shoulder horizontal abduction, shoulder flexion, and elbow flexion.  Handouts were provided for reference.  Returned to the wheelchair at the end of the session squat pivot again at min guard with return to the room.  She transferred to the recliner with min guard using the RW with min instructional cueing for hand placement with sit to stand.    Session 2: ([id number])  Pt in bed to start with report of pain less than in the am listed below.  She rated it at around a 5/10.  She was able to donn her sock and shoe with setup and completed squat pivot transfer to the wheelchair at min guard assist level.  Next, she was taken down to the dayroom where she worked on sit to stand and standing balance with use of the RW for support.  [employer] was integrated for functional reaching.  She was able to reach down to knee level  with the RUE to pick up bean bags and toss them with supervision. Occasional min assist was needed when reaching down with the left.  Completed 3-4 intervals of standing lasting up to approximately 2 mins each.  Returned to the room at the end of the session with transfer back to the bed to rest at min guard assist level with the RW for support.    Therapy Documentation Precautions:  Precautions Precautions: Fall, Other (comment) Precaution Comments: left AKA Restrictions Weight Bearing Restrictions: Yes LLE Weight Bearing: Non weight bearing  Pain: Pain Assessment Pain Scale: 0-10 Pain Score: 7  Faces Pain Scale: Hurts little more Pain Type: Phantom pain Pain Descriptors / Indicators: Aching Pain Frequency: Intermittent Patients Stated Pain Goal: 3 Pain Intervention(s): Medication (See eMAR)   Therapy/Group: Individual Therapy  Falyn Rubel OTR/L 03/16/2021, 4:17 PM

## 2021-03-17 LAB — GLUCOSE, CAPILLARY
Glucose-Capillary: 90 mg/dL (ref 70–99)
Glucose-Capillary: 122 mg/dL — ABNORMAL HIGH (ref 70–99)
Glucose-Capillary: 136 mg/dL — ABNORMAL HIGH (ref 70–99)
Glucose-Capillary: 138 mg/dL — ABNORMAL HIGH (ref 70–99)

## 2021-03-17 MED ORDER — ZINC SULFATE 220 (50 ZN) MG PO CAPS: 220.0000 mg | ORAL_CAPSULE | Freq: Every day | ORAL | 0 refills | Status: DC

## 2021-03-17 MED ORDER — DOCUSATE SODIUM 100 MG PO CAPS: 100.0000 mg | ORAL_CAPSULE | Freq: Two times a day (BID) | ORAL | 0 refills | Status: DC

## 2021-03-17 MED ORDER — ASCORBIC ACID 250 MG PO TABS: 250.0000 mg | ORAL_TABLET | Freq: Two times a day (BID) | ORAL | 0 refills | Status: DC

## 2021-03-17 MED ORDER — NORTRIPTYLINE HCL 10 MG PO CAPS: 10.0000 mg | ORAL_CAPSULE | Freq: Every day | ORAL | 1 refills | Status: DC

## 2021-03-17 MED ORDER — HYDROCODONE-ACETAMINOPHEN 7.5-325 MG PO TABS: 1.0000 | ORAL_TABLET | Freq: Four times a day (QID) | ORAL | 0 refills | Status: DC | PRN

## 2021-03-17 MED ORDER — INSULIN GLARGINE 100 UNIT/ML SOLOSTAR PEN: 15.0000 [IU] | PEN_INJECTOR | Freq: Two times a day (BID) | SUBCUTANEOUS | 0 refills | Status: DC

## 2021-03-17 MED ORDER — TRAMADOL HCL 50 MG PO TABS: 50.0000 mg | ORAL_TABLET | Freq: Two times a day (BID) | ORAL | 0 refills | Status: DC

## 2021-03-17 MED ORDER — METOPROLOL TARTRATE 50 MG PO TABS: 50.0000 mg | ORAL_TABLET | Freq: Two times a day (BID) | ORAL | 0 refills | Status: DC

## 2021-03-17 MED ORDER — CAREFINE PEN NEEDLES 32G X 6 MM MISC: 1.0000 "application " | Freq: Two times a day (BID) | 1 refills | Status: AC

## 2021-03-17 NOTE — Progress Notes (Signed)
Inpatient Rehabilitation Care Coordinator Discharge Note   Patient Details  Name: [name] MRN: [id number] Date of Birth: March 25, 1940   Discharge location: Home  Length of Stay: 5 Days  Discharge activity level: Sup/Cga  Home/community participation: spouse and daughter  Patient response OX:BDZHGD Literacy - How often do you need to have someone help you when you read instructions, pamphlets, or other written material from your doctor or pharmacy?: Rarely  Patient response JM:EQASTM Isolation - How often do you feel lonely or isolated from those around you?: Never  Services provided included: SW, TR, Pharmacy, CM, RN, SLP, OT, PT, RD, MD  Financial Services:  Financial Services Utilized: [place] offered to/list presented to: patient and daughter  Follow-up services arranged:  [place]: [place]         Patient response to transportation need: Is the patient able to respond to transportation needs?: Yes In the past 12 months, has lack of transportation kept you from medical appointments or from getting medications?: No In the past 12 months, has lack of transportation kept you from meetings, work, or from getting things needed for daily living?: No    Comments (or additional information):  Patient/Family verbalized understanding of follow-up arrangements:  Yes  Individual responsible for coordination of the follow-up plan: patient or daughter  Confirmed correct DME delivered: Dyanne Iha 03/17/2021    Dyanne Iha

## 2021-03-17 NOTE — Progress Notes (Addendum)
PROGRESS NOTE   Subjective/Complaints: Slept okay, husband and daughter at bedside, has phantom pain, discussed phantom pain , intolerance to typical agents gabpentin and pregabalin, on nortriptyline   ROS- neg CP, SOB< N/V/D  Objective:   No results found. No results for input(s): WBC, HGB, HCT, PLT in the last 72 hours.  Recent Labs    03/16/21 1135  NA 135  K 4.0  CL 101  CO2 23  GLUCOSE 90  BUN 29*  CREATININE 1.50*  CALCIUM 8.8*     Intake/Output Summary (Last 24 hours) at 03/17/2021 0836 Last data filed at 03/16/2021 1700 Gross per 24 hour  Intake 118 ml  Output --  Net 118 ml         Physical Exam: Vital Signs Blood pressure (!) 178/85, pulse 93, temperature 98.1 F (36.7 C), resp. rate 17, SpO2 97 %.  General: No acute distress Mood and affect are appropriate Heart: Regular rate and rhythm no rubs murmurs or extra sounds Lungs: Clear to auscultation, breathing unlabored, no rales or wheezes Abdomen: Positive bowel sounds, soft nontender to palpation, nondistended Extremities: No clubbing, cyanosis, or edema Skin: No evidence of breakdown, no evidence of rash Neurologic: Cranial nerves II through XII intact, motor strength is 5/5 in bilateral deltoid, bicep, tricep, grip, RIght hip flexor, knee extensors, ankle dorsiflexor and plantar flexor Sensory exam normal sensation to light touch and proprioception in bilateral upper and lower extremities  Musculoskeletal: Full range of motion in all 4 extremities. No joint swelling     Assessment/Plan: 1. Functional deficits which require 3+ hours per day of interdisciplinary therapy in a comprehensive inpatient rehab setting. Physiatrist is providing close team supervision and 24 hour management of active medical problems listed below. Physiatrist and rehab team continue to assess barriers to discharge/monitor patient progress toward functional and  medical goals  Care Tool:  Bathing    Body parts bathed by patient: Left arm, Chest, Right arm, Abdomen, Front perineal area, Buttocks, Right upper leg, Left upper leg, Right lower leg, Left lower leg, Face         Bathing assist Assist Level: Set up assist     Upper Body Dressing/Undressing Upper body dressing   What is the patient wearing?: Pull over shirt, Bra    Upper body assist Assist Level: Set up assist    Lower Body Dressing/Undressing Lower body dressing      What is the patient wearing?: Hospital gown only     Lower body assist Assist for lower body dressing: Minimal Assistance - Patient > 75%     Toileting Toileting    Toileting assist Assist for toileting: Minimal Assistance - Patient > 75%     Transfers Chair/bed transfer  Transfers assist     Chair/bed transfer assist level: Contact Guard/Touching assist Chair/bed transfer assistive device: Armrests, Programmer, multimedia   Ambulation assist      Assist level: Contact Guard/Touching assist Assistive device: Walker-rolling Max distance: 65ft   Walk 10 feet activity   Assist     Assist level: Minimal Assistance - Patient > 75% Assistive device: Walker-rolling   Walk 50 feet activity   Assist Walk 50  feet with 2 turns activity did not occur: Safety/medical concerns         Walk 150 feet activity   Assist Walk 150 feet activity did not occur: Safety/medical concerns         Walk 10 feet on uneven surface  activity   Assist Walk 10 feet on uneven surfaces activity did not occur: Safety/medical concerns         Wheelchair     Assist Is the patient using a wheelchair?: Yes Type of Wheelchair: Manual    Wheelchair assist level: Supervision/Verbal cueing, Set up assist Max wheelchair distance: 136ft    Wheelchair 50 feet with 2 turns activity    Assist        Assist Level: Supervision/Verbal cueing   Wheelchair 150 feet activity      Assist      Assist Level: Moderate Assistance - Patient 50 - 74%   Blood pressure (!) 178/85, pulse 93, temperature 98.1 F (36.7 C), resp. rate 17, SpO2 97 %.  Medical Problem List and Plan: 1. 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  Impaired mobility -DVT/anticoagulation:  Pharmaceutical: Heparin             -antiplatelet therapy: continue aspirin 81mg .  3. Postoperative pain: Hydrocodone prn. Continue vitamin C 250mg  BID.  Nortriptyline 10mg  qhs 4. Mood: LCSW to follow for evaluation and support.              -antipsychotic agents: N/A 5. Neuropsych: This patient is capable of making decisions on her own behalf. 6. Skin/Wound Care: Routine pressure relief measures.              --Add vitamin C and Zinc. AKA incision looks great 7. Fluids/Electrolytes/Nutrition: Monitor I/O. Check CMET in am.  8. T2DM:  CBG (last 3)  Recent Labs    03/16/21 1627 03/16/21 2122 03/17/21 0553  GLUCAP 101* 140* 90   Controlled 2/24 semaglutide .5mg  q Monday  , Semglee 15 U BID, glucotrol XL 10mg  Qd  9. HTN: Monitor BP TID Vitals:   03/16/21 1929 03/17/21 0317  BP: (!) 164/77 (!) 178/85  Pulse: 92 93  Resp: 17 17  Temp: 97.8 F (36.6 C) 98.1 F (36.7 C)  SpO2: 95% 97%  Increase Lopressor, will increase to 75mg  BID  10. CAD s/p CABG: Monitor for symptoms with increase in activity.             --Continue ASA, BB and  11. RA: Managed with infusion every 2 months (Dr. Trudie Reed)             --aware of need to hold medication to help promote healing.  12.  Depression: Managed with Paxil at current dose.  13. Constipation: continue colace 100mg  BID    LOS: 4 days A FACE TO FACE EVALUATION WAS PERFORMED  [name] 03/17/2021, 8:36 AM

## 2021-03-17 NOTE — Progress Notes (Signed)
Occupational Therapy Session Note  Patient Details  Name: [name] MRN: [id number] Date of Birth: 05-Jan-1941  Today's Date: 03/17/2021 OT Individual Time: 1100-1115 OT Individual Time Calculation (min): 15 min    Short Term Goals: Week 1:  OT Short Term Goal 1 (Week 1): STGs equal to LTGs set a supervision to modified independent based on LOS.   Skilled Therapeutic Interventions/Progress Updates:    Pt sitting up in w/c,requesting to use bathroom, no c/o pain.  Pt self propelled w/c into bathroom and required min assist for setup due to tight space.  Pt completed stand pivot using grab bars with close supervision.  Toileting and toilet transfer all completed with supervision.  Stand pivot back to w/c with close supervision.  Returned to room, call bell in reach, seat alarm on.    Therapy Documentation Precautions:  Precautions Precautions: Fall, Other (comment) Precaution Comments: left AKA Restrictions Weight Bearing Restrictions: Yes LLE Weight Bearing: Non weight bearing   Therapy/Group: Individual Therapy  Ezekiel Slocumb 03/17/2021, 4:44 PM

## 2021-03-17 NOTE — Progress Notes (Addendum)
Patient ID: [name], female   DOB: 1940-09-13, 81 y.o.   MRN: [id number]  Patient [facility] referral sent to [facility]   *Patient approved for: SN/PT  SOC: 2/26 Orders sent

## 2021-03-17 NOTE — Plan of Care (Signed)
Problem: RH Balance Goal: LTG Patient will maintain dynamic standing balance (PT) Description: LTG:  Patient will maintain dynamic standing balance with assistance during mobility activities (PT) Outcome: Completed/Met   Problem: RH Balance Goal: LTG Patient will maintain dynamic sitting balance (PT) Description: LTG:  Patient will maintain dynamic sitting balance with assistance during mobility activities (PT) Outcome: Completed/Met Goal: LTG Patient will maintain dynamic standing balance (PT) Description: LTG:  Patient will maintain dynamic standing balance with assistance during mobility activities (PT) Outcome: Completed/Met   Problem: RH Bed Mobility Goal: LTG Patient will perform bed mobility with assist (PT) Description: LTG: Patient will perform bed mobility with assistance, with/without cues (PT). Outcome: Completed/Met   Problem: Sit to Stand Goal: LTG:  Patient will perform sit to stand with assistance level (PT) Description: LTG:  Patient will perform sit to stand with assistance level (PT) Outcome: Not Met (add Reason) Flowsheets (Taken 03/17/2021 1257) LTG: PT will perform sit to stand in preparation for functional mobility with assistance level: (sooner than expected d/c) --   Problem: Sit to Stand Goal: LTG:  Patient will perform sit to stand with assistance level (PT) Description: LTG:  Patient will perform sit to stand with assistance level (PT) Outcome: Not Met (add Reason) Flowsheets (Taken 03/17/2021 1257) LTG: PT will perform sit to stand in preparation for functional mobility with assistance level: (sooner than expected d/c) --   Problem: RH Bed to Chair Transfers Goal: LTG Patient will perform bed/chair transfers w/assist (PT) Description: LTG: Patient will perform bed to chair transfers with assistance (PT). Outcome: Not Met (add Reason) Flowsheets (Taken 03/17/2021 1257) LTG: Pt will perform Bed to Chair Transfers with assistance level: (sooner than  expected d/c) --   Problem: RH Car Transfers Goal: LTG Patient will perform car transfers with assist (PT) Description: LTG: Patient will perform car transfers with assistance (PT). Outcome: Not Met (add Reason) Flowsheets (Taken 03/17/2021 1257) LTG: Pt will perform car transfers with assist:: (sooner than expected d/c) --   Problem: RH Ambulation Goal: LTG Patient will ambulate in controlled environment (PT) Description: LTG: Patient will ambulate in a controlled environment, # of feet with assistance (PT). Outcome: Not Met (add Reason) Flowsheets (Taken 03/17/2021 1257) LTG: Pt will ambulate in controlled environ  assist needed:: (sooner than expected d/c) -- Goal: LTG Patient will ambulate in home environment (PT) Description: LTG: Patient will ambulate in home environment, # of feet with assistance (PT). Outcome: Not Met (add Reason) Flowsheets (Taken 03/17/2021 1257) LTG: Pt will ambulate in home environ  assist needed:: (sooner than expected d/c) --   Problem: RH Wheelchair Mobility Goal: LTG Patient will propel w/c in controlled environment (PT) Description: LTG: Patient will propel wheelchair in controlled environment, # of feet with assist (PT) Outcome: Not Met (add Reason) Flowsheets (Taken 03/17/2021 1257) LTG: Pt will propel w/c in controlled environ  assist needed:: (sooner than expected d/c) -- Goal: LTG Patient will propel w/c in home environment (PT) Description: LTG: Patient will propel wheelchair in home environment, # of feet with assistance (PT). Outcome: Not Met (add Reason) Flowsheets (Taken 03/17/2021 1257) LTG: Pt will propel w/c in home environ  assist needed:: (sooner than expected d/c) --   Problem: RH Stairs Goal: LTG Patient will ambulate up and down stairs w/assist (PT) Description: LTG: Patient will ambulate up and down # of stairs with assistance (PT) Outcome: Not Met (add Reason) Flowsheets (Taken 03/17/2021 1257) LTG: Pt will ambulate up/down stairs  assist needed:: (sooner than expected d/c) --

## 2021-03-17 NOTE — Progress Notes (Signed)
Occupational Therapy Session Note  Patient Details  Name: [name] MRN: [id number] Date of Birth: 01/19/1941  Today's Date: 03/17/2021 OT Individual Time: 0802-0900 OT Individual Time Calculation (min): 58 min    Short Term Goals: Week 1:  OT Short Term Goal 1 (Week 1): STGs equal to LTGs set a supervision to modified independent based on LOS.  Skilled Therapeutic Interventions/Progress Updates:    Pt in bed to start with family (spouse and daughter) in for education.  Provided education on completion of selfcare tasks including bathing, dressing, shower tub transfers, toilet transfers, and toileting tasks.  Also provided education on stand pivot and squat pivot transfers to various surfaces including the bed, toilet, and tub bench.  Therapist had pt transfer to supine for removal of residual limb shrinker and education on donning using the shrinker aide.  Therapist provided total assist to donn with pt voicing increased pain with initial fitting, but this lessened after a few mins.  Finished session with transfer back to the EOB with supervision and then pt's spouse assisted her with standing to pull her pants back up after having to push them down to donn the shrinker.  She transferred to the wheelchair with supervision to complete session.   Therapy Documentation Precautions:  Precautions Precautions: Fall, Other (comment) Precaution Comments: left AKA Restrictions Weight Bearing Restrictions: Yes LLE Weight Bearing: Non weight bearing  Pain: Pain Assessment Pain Scale: 0-10 Pain Score: 0-No pain ADL: See Care Tool Section for some details of mobility and selfcare   Therapy/Group: Individual Therapy  Gayathri Futrell OTR/L 03/17/2021, 2:41 PM

## 2021-03-17 NOTE — Progress Notes (Addendum)
Occupational Therapy Discharge Summary  Patient Details  Name: [name] MRN: [id number] Date of Birth: [birth year]  Today's Date: 03/17/2021 OT Individual Time: 1422-1530 OT Individual Time Calculation (min): 68 min   Session Note:  Pt in bed to start with transfer to the EOB with independence using the rail for support.  She then donned her sock and shoe on the right foot with setup.  Supervision was needed for transfer to the wheelchair squat pivot.  She then rolled herself down to the nurses station with modified independence with therapist pushing her down to the ortho gym while resting.  She was able to complete 4 intervals of 2-3 mins for UE strengthening with use of the UE ergonometer.  Resistance on level 7 Random Program with RPMs at 20-25.  HR at 75 and O2 sats at 95% on room air between intervals.  Next, took pt to the ADL apartment for practice with simulated meal prep wheelchair level.  She was able to appropriately take items from the refrigerator as well as simulating removing and placing items in the oven and the dishwasher.  Also discussed transporting items down the countertops to the table, which is just next to the counter, making it easy to transition as wheelchair level.  Had her also complete stand pivot transfer to the recliner with the RW at supervision level.  Finished session with return to the room with transfer to the bed at supervision level to complete session.  Call button and phone in reach with safety alarm belt in place.    Patient has met 8 of 8 long term goals due to improved activity tolerance, improved balance, and ability to compensate for deficits.  Patient to discharge at overall Supervision level.  Patient's care partner is independent to provide the necessary physical and cognitive assistance at discharge.    Reasons goals not met: NA  Recommendation:  Pt is currently supervision for selfcare tasks sit to stand and can be modified independent with  more practice on balance.  Feel this can be addressed with PT without need for OT at this time.    Equipment: Drop arm commode, tub bench, RW, wheelchair  Reasons for discharge: treatment goals met and discharge from hospital  Patient/family agrees with progress made and goals achieved: Yes  OT Discharge Precautions/Restrictions  Precautions Precautions: Fall;Other (comment) Precaution Comments: left AKA Restrictions Weight Bearing Restrictions: Yes LLE Weight Bearing: Non weight bearing  Pain Pain Assessment Pain Scale: 0-10 Pain Score: 8  Pain Type: Acute pain Pain Location: Leg Pain Onset: On-going Pain Intervention(s): Medication (See eMAR) ADL ADL Eating: Independent Where Assessed-Eating: Chair Grooming: Independent Where Assessed-Grooming: Edge of bed Upper Body Bathing: Setup Where Assessed-Upper Body Bathing: Shower Lower Body Bathing: Setup Where Assessed-Lower Body Bathing: Chair, Administrator, sports Dressing: Independent Where Assessed-Upper Body Dressing: Wheelchair Lower Body Dressing: Supervision/safety Where Assessed-Lower Body Dressing: Wheelchair Toileting: Supervision/safety Where Assessed-Toileting: Bedside Commode Toilet Transfer: Close supervision Toilet Transfer Method: Stand pivot Science writer: Radiographer, therapeutic: Close supervison Clinical cytogeneticist Method: Librarian, academic: Facilities manager: Close supervision Social research officer, government Method: Radiographer, therapeutic: Radio broadcast assistant Vision Baseline Vision/History: 1 Wears glasses;4 Cataracts Patient Visual Report: No change from baseline Vision Assessment?: No apparent visual deficits Perception  Perception: Within Functional Limits Praxis Praxis: Intact Cognition Overall Cognitive Status: Within Functional Limits for tasks assessed Arousal/Alertness: Awake/alert Orientation Level: Oriented  X4 Year: 2023 Month: February Day of Week: Correct Attention:  Focused;Selective;Sustained Focused Attention: Appears intact Sustained Attention: Appears intact Selective Attention: Appears intact Memory: Appears intact Memory recall: [name] and Bed  Memory Recall Sock: Without Cue Memory Recall Blue: Without Cue Memory Recall Bed: Without Cue Problem Solving: Appears intact Safety/Judgment: Appears intact Sensation Sensation Light Touch: Appears Intact Hot/Cold: Appears Intact Proprioception: Appears Intact Stereognosis: Appears Intact Additional Comments: Sensation WFLs for BUEs Coordination Gross Motor Movements are Fluid and Coordinated: Yes Fine Motor Movements are Fluid and Coordinated: Yes Motor  Motor Motor: Within Functional Limits Motor - Discharge Observations: Generalized weakness and deconditioning Mobility  Bed Mobility Bed Mobility: Supine to Sit;Sit to Supine;Rolling Right;Rolling Left Rolling Right: Independent Rolling Left: Independent Supine to Sit: Supervision/Verbal cueing Sit to Supine: Supervision/Verbal cueing Transfers Sit to Stand: Supervision/Verbal cueing Stand to Sit: Supervision/Verbal cueing  Trunk/Postural Assessment  Cervical Assessment Cervical Assessment: Exceptions to [facility] (slight forward head) Thoracic Assessment Thoracic Assessment: Exceptions to [facility] (slight thoracic rounding) Lumbar Assessment Lumbar Assessment: Exceptions to [facility] (posterior pelvic tilt)  Balance Balance Balance Assessed: Yes Static Sitting Balance Static Sitting - Balance Support: Bilateral upper extremity supported Static Sitting - Level of Assistance: 7: Independent Dynamic Sitting Balance Dynamic Sitting - Balance Support: During functional activity Dynamic Sitting - Level of Assistance: 6: Modified independent (Device/Increase time) Static Standing Balance Static Standing - Balance Support: During functional activity;Bilateral upper extremity  supported Static Standing - Level of Assistance: 5: Stand by assistance;Other (comment) Dynamic Standing Balance Dynamic Standing - Balance Support: During functional activity;Bilateral upper extremity supported Dynamic Standing - Level of Assistance: 5: Stand by assistance Extremity/Trunk Assessment RUE Assessment RUE Assessment: Within Functional Limits LUE Assessment LUE Assessment: Within Functional Limits   Aquil Duhe OTR/L 03/17/2021, 5:00 PM

## 2021-03-17 NOTE — Progress Notes (Signed)
Inpatient Rehabilitation Discharge Medication Review by a Pharmacist  A complete drug regimen review was completed for this patient to identify any potential clinically significant medication issues.  High Risk Drug Classes Is patient taking? Indication by Medication  Antipsychotic No   Anticoagulant No   Antibiotic No   Opioid Yes Norco- acute pain  Antiplatelet Yes Aspirin- CVA prophylaxis  Hypoglycemics/insulin Yes Lantus, glipizide, semaglutide- T2DM  Vasoactive Medication Yes Lopressor, losartan- BP  Chemotherapy No   Other Yes Repatha- HLD Vascepa- hypertriglyceridemia Simponi Aria- RA Synthroid- hypothyroidism Protonix- GERD Paxil- MDD Pregabalin, notriptyline- neuropathic pain     Type of Medication Issue Identified Description of Issue Recommendation(s)  Drug Interaction(s) (clinically significant)     Duplicate Therapy     Allergy     No Medication Administration End Date     Incorrect Dose     Additional Drug Therapy Needed     Significant med changes from prior encounter (inform family/care partners about these prior to discharge).    Other       Clinically significant medication issues were identified that warrant physician communication and completion of prescribed/recommended actions by midnight of the next day:  No  Time spent performing this drug regimen review (minutes):  30   Ahmiya Abee BS, PharmD, BCPS Clinical Pharmacist 03/17/2021 11:08 AM

## 2021-03-17 NOTE — Progress Notes (Signed)
Physical Therapy Session Note  Patient Details  Name: [name] MRN: [id number] Date of Birth: 24-May-1940  Today's Date: 03/17/2021 PT Individual Time: 1000-1045 PT Individual Time Calculation (min): 45 min  and Today's Date: 03/17/2021 PT Missed Time: 15 Minutes Missed Time Reason: Patient fatigue  Short Term Goals: Week 1:  PT Short Term Goal 1 (Week 1): = to LTGs based on ELOS  Skilled Therapeutic Interventions/Progress Updates:    Patient received sitting up in wc, agreeable to PT. She denies pain. Family present for family education. PT reviewing importance of use of gait belt for transfers and mobility, locking wheelchair, removing leg rests, hand positioning for standing up to walker. PT emphasizing importance of having someone with her for all functional mobility. Patient and family verbalized understanding. Patient propelling herself ~174ft in wc using B UE before she reported fatigue. She was able to transfer into SUV-height car with RW and CGA. Patient hoping ~55ft with RW and CGA. Patient demonstrating stand pivot to high bed height. With CGA. Daughter able to demonstrate competency with transfer- husband declining hands on practice, stating "I'll manage." Per family, ramp has been installed over the step to enter house. PT providing patient and family with informational booklet for individuals with amputations. Patient requesting to return to her room to rest. Up in chair, needs within reach, family at bedside.   Therapy Documentation Precautions:  Precautions Precautions: Fall, Other (comment) Precaution Comments: left AKA Restrictions Weight Bearing Restrictions: Yes LLE Weight Bearing: Non weight bearing    Therapy/Group: Individual Therapy  Karoline Caldwell, PT, DPT, CBIS  03/17/2021, 7:52 AM

## 2021-03-18 DIAGNOSIS — I10 Essential (primary) hypertension: Secondary | ICD-10-CM

## 2021-03-18 DIAGNOSIS — E1140 Type 2 diabetes mellitus with diabetic neuropathy, unspecified: Secondary | ICD-10-CM

## 2021-03-18 DIAGNOSIS — E1142 Type 2 diabetes mellitus with diabetic polyneuropathy: Secondary | ICD-10-CM

## 2021-03-18 LAB — GLUCOSE, CAPILLARY: Glucose-Capillary: 97 mg/dL (ref 70–99)

## 2021-03-18 NOTE — Progress Notes (Signed)
Patient discharged via wheel by staff, with spouse by side. Voice understanding of discharge instructions. Medications picked up from phar and given to patient at time of discharge

## 2021-03-18 NOTE — Progress Notes (Addendum)
PROGRESS NOTE   Subjective/Complaints: Patient seen sitting up in her chair this morning.  She states she stopped well overnight.  She states she is ready for discharge. Discussed with charge nurse, bedside commode delivered.   ROS- neg CP, SOB< N/V/D  Objective:   No results found. No results for input(s): WBC, HGB, HCT, PLT in the last 72 hours.  Recent Labs    03/16/21 1135  NA 135  K 4.0  CL 101  CO2 23  GLUCOSE 90  BUN 29*  CREATININE 1.50*  CALCIUM 8.8*     Intake/Output Summary (Last 24 hours) at 03/18/2021 0955 Last data filed at 03/18/2021 0801 Gross per 24 hour  Intake 476 ml  Output --  Net 476 ml         Physical Exam: Vital Signs Blood pressure 127/66, pulse 87, temperature 97.8 F (36.6 C), temperature source Oral, resp. rate 16, SpO2 100 %.  General: No acute distress Mood and affect are appropriate Heart: Regular rate and rhythm no rubs murmurs or extra sounds Lungs: Clear to auscultation, breathing unlabored, no rales or wheezes Abdomen: Positive bowel sounds, soft nontender to palpation, nondistended Extremities: No clubbing, cyanosis, or edema Skin: No evidence of breakdown, no evidence of rash Neurologic: Cranial nerves II through XII intact, motor strength is 5/5 in bilateral deltoid, bicep, tricep, grip, RIght hip flexor, knee extensors, ankle dorsiflexor and plantar flexor Sensory exam normal sensation to light touch and proprioception in bilateral upper and lower extremities  Musculoskeletal: Full range of motion in all 4 extremities. No joint swelling     Assessment/Plan: 1. Functional deficits which require 3+ hours per day of interdisciplinary therapy in a comprehensive inpatient rehab setting. Physiatrist is providing close team supervision and 24 hour management of active medical problems listed below. Physiatrist and rehab team continue to assess barriers to  discharge/monitor patient progress toward functional and medical goals  Care Tool:  Bathing    Body parts bathed by patient: Left arm, Chest, Right arm, Abdomen, Front perineal area, Buttocks, Right upper leg, Right lower leg, Left lower leg, Face     Body parts n/a: Left upper leg   Bathing assist Assist Level: Set up assist     Upper Body Dressing/Undressing Upper body dressing   What is the patient wearing?: Pull over shirt, Bra    Upper body assist Assist Level: Independent with assistive device Assistive Device Comment: wheelchair  Lower Body Dressing/Undressing Lower body dressing      What is the patient wearing?: Incontinence brief, Pants     Lower body assist Assist for lower body dressing: Supervision/Verbal cueing     Toileting Toileting    Toileting assist Assist for toileting: Supervision/Verbal cueing     Transfers Chair/bed transfer  Transfers assist     Chair/bed transfer assist level: Supervision/Verbal cueing Chair/bed transfer assistive device: Armrests, Programmer, multimedia   Ambulation assist      Assist level: Contact Guard/Touching assist Assistive device: Walker-rolling Max distance: 36ft   Walk 10 feet activity   Assist     Assist level: Contact Guard/Touching assist Assistive device: Walker-rolling   Walk 50 feet activity   Assist  Walk 50 feet with 2 turns activity did not occur: Safety/medical concerns         Walk 150 feet activity   Assist Walk 150 feet activity did not occur: Safety/medical concerns         Walk 10 feet on uneven surface  activity   Assist Walk 10 feet on uneven surfaces activity did not occur: Safety/medical concerns         Wheelchair     Assist Is the patient using a wheelchair?: Yes Type of Wheelchair: Manual    Wheelchair assist level: Supervision/Verbal cueing, Set up assist Max wheelchair distance: 147ft    Wheelchair 50 feet with 2 turns  activity    Assist        Assist Level: Supervision/Verbal cueing   Wheelchair 150 feet activity     Assist      Assist Level: Moderate Assistance - Patient 50 - 74%   Blood pressure 127/66, pulse 87, temperature 97.8 F (36.6 C), temperature source Oral, resp. rate 16, SpO2 100 %.  Medical Problem List and Plan: 1. 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  Impaired mobility -DVT/anticoagulation:  Pharmaceutical: Heparin             -antiplatelet therapy: continue aspirin 81mg .  3. Postoperative pain: Hydrocodone prn. Continue vitamin C 250mg  BID.  Nortriptyline 10mg  qhs 4. Mood: LCSW to follow for evaluation and support.              -antipsychotic agents: N/A 5. Neuropsych: This patient is capable of making decisions on her own behalf. 6. Skin/Wound Care: Routine pressure relief measures.              --Add vitamin C and Zinc. AKA incision looks great 7. Fluids/Electrolytes/Nutrition: Monitor I/O. Check CMET in am.  8. T2DM:  CBG (last 3)  Recent Labs    03/17/21 1614 03/17/21 2234 03/18/21 0558  GLUCAP 136* 138* 97   Controlled on 2/25 semaglutide .5mg  q Monday  , Semglee 15 U BID, glucotrol XL 10mg  Qd 9. HTN: Monitor BP TID Vitals:   03/18/21 0348 03/18/21 0925  BP: 137/64 127/66  Pulse: 98 87  Resp: 18 16  Temp: 97.7 F (36.5 C) 97.8 F (36.6 C)  SpO2: 97% 100%  Increase Lopressor, will increase to 75mg  BID   Multiple controlled on 2/25 10. CAD s/p CABG: Monitor for symptoms with increase in activity.             --Continue ASA, BB and  11. RA: Managed with infusion every 2 months (Dr. Trudie Reed)             --aware of need to hold medication to help promote healing.  12.  Depression: Managed with Paxil at current dose.  13. Constipation: continue colace 100mg  BID  LOS: 5 days A FACE TO FACE EVALUATION WAS PERFORMED  Derrick Orris Lorie Phenix 03/18/2021, 9:55 AM

## 2021-03-22 ENCOUNTER — Telehealth: Payer: Self-pay | Admitting: Family Medicine

## 2021-03-22 NOTE — Telephone Encounter (Signed)
Liji is calling from [facility] needing verbal orders for PT 3weeks/2times, 3weeks/1time. Please advise Liji at [phone]. ?

## 2021-03-22 NOTE — Telephone Encounter (Signed)
Called and gave verbal okay for PT.  Dm/cma ? ?

## 2021-03-23 ENCOUNTER — Telehealth: Payer: Self-pay | Admitting: *Deleted

## 2021-03-23 NOTE — Telephone Encounter (Signed)
Prior auth submitted to insurance via CoverMyMeds. Request Reference Number: [id number]. NORTRIPTYLIN CAP 10MG  is approved through 01/21/2022. Your patient may now fill this prescription and it will be covered. ?

## 2021-03-24 ENCOUNTER — Telehealth: Payer: Self-pay

## 2021-03-24 NOTE — Telephone Encounter (Signed)
Pt's daughter called to let us know pt has not been wearing the compression sock on her amputation stump and it does not fit well, it rolls down. PT has been putting an ACE wrap around the area for compression. She is okay with this until her f/u in 2 weeks. No further questions/concerns at this time. ?

## 2021-03-29 ENCOUNTER — Ambulatory Visit (INDEPENDENT_AMBULATORY_CARE_PROVIDER_SITE_OTHER): Payer: Medicare Other | Admitting: Family Medicine

## 2021-03-29 ENCOUNTER — Other Ambulatory Visit: Payer: Self-pay

## 2021-03-29 VITALS — BP 130/74 | HR 82 | Temp 97.6°F | Ht 63.0 in | Wt 135.0 lb

## 2021-03-29 DIAGNOSIS — I739 Peripheral vascular disease, unspecified: Secondary | ICD-10-CM

## 2021-03-29 DIAGNOSIS — S78112A Complete traumatic amputation at level between left hip and knee, initial encounter: Secondary | ICD-10-CM

## 2021-03-29 DIAGNOSIS — E1140 Type 2 diabetes mellitus with diabetic neuropathy, unspecified: Secondary | ICD-10-CM

## 2021-03-29 NOTE — Progress Notes (Signed)
[facility] [address] [place] [address] Dept: [phone] Dept Fax: [phone]  Office Visit  Subjective:    Patient ID: [name], female    DOB: [birth year], 81 y.o..   MRN: [id number]  No chief complaint on file.   History of Present Illness:  Patient is in today for hospital follow-up. [name] was admitted at [facility] from 2/7-2/20/2023. She had been seen with a non-healing surgical wound after a left toe nail removal. She was found to have advanced peripheral arterial disease. 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 [name] was then admitted for inpatient rehab 03/13/2021-03/18/2021. She feels like she is settling in well at home since discharge. She feels her amputation site is healing well. She notes her compression stocking was too tight, so her home health nurse is trying ot get her a new one. She has a wheelchair and is able to maneuver this in the home well. Her husband built a ramp for her to use at the front. She has a walker, but is not using this yet. She had a shower bench and is able to use a handheld wand. She is toileting without much trouble. The home health nurse is coming out once a week. PT is coming out twice a week. 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   Primary insomnia 11/02/2020    Irritation of eyelid 07/26/2020   S/P aortic valve replacement with bioprosthetic valve 05/04/2020   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   PAD with bilateral claudication    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[place]) 04/07/2019   Low back pain 02/03/2019   Scoliosis deformity of spine 02/03/2019   Osteoarthritis of hip 11/17/2018   Claudication ([place])    Chronic headaches 11/12/2017   Degeneration of lumbar intervertebral disc 06/13/2017   Hyperkalemia 06/03/2017   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[place])    Mass of left adrenal gland ([place]) 04/19/2017   Spinal stenosis of lumbar region 10/20/2014   Hyperlipidemia 03/16/2013   Dense breasts 12/22/2012   Anxiety state 04/16/2012   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/16/2012   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   Osteoarthritis of knee 04/16/2012   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 female breast ([place]) 12/20/2010   History of breast cancer 12/05/2010   Depression 09/14/2008   GERD 09/14/2008   Type 2 diabetes mellitus with diabetic neuropathy, without long-term current use of insulin ([place]) 05/20/2008   Coronary atherosclerosis 04/17/2007   Hypothyroidism 04/03/2007   Past Surgical History:  Procedure Laterality Date   ABDOMINAL AORTOGRAM W/LOWER EXTREMITY Bilateral 04/29/2019   Procedure: ABDOMINAL AORTOGRAM W/LOWER EXTREMITY;  Surgeon: Marty Heck, MD;  Location: [facility];  Service: Cardiovascular;  Laterality: Bilateral;   ABDOMINAL AORTOGRAM W/LOWER EXTREMITY Left 07/01/2019   Procedure: ABDOMINAL AORTOGRAM W/ Left LOWER EXTREMITY [place];  Surgeon: Marty Heck, MD;  Location: [facility];  Service: Cardiovascular;  Laterality: Left;   ABDOMINAL AORTOGRAM W/LOWER EXTREMITY Left 03/03/2021   Procedure: ABDOMINAL AORTOGRAM W/LOWER EXTREMITY;  Surgeon: Cherre Robins, MD;  Location: [facility];  Service: Cardiovascular;  Laterality: Left;   AMPUTATION  Left 03/06/2021   Procedure: LEFT ABOVE KNEE AMPUTATION;  Surgeon: [name], MD;  Location: [facility];  Service: Vascular;  Laterality: Left;   ANTERIOR AND POSTERIOR REPAIR N/A 04/05/2016   Procedure: ANTERIOR (CYSTOCELE) AND POSTERIOR REPAIR (RECTOCELE);  Surgeon: [name], MD;  Location: [facility];  Service: Gynecology;  Laterality: N/A;   AORTIC VALVE REPLACEMENT N/A 05/04/2020   Procedure: AORTIC VALVE REPLACEMENT (AVR) USING INSPIRIS 23MM RESILIA AORTIC VALVE;  Surgeon: Rexene Alberts, MD;  Location: [facility];  Service: Open Heart Surgery;  Laterality: N/A;   BREAST BIOPSY     BREAST LUMPECTOMY  1983   benign biopsy   BREAST LUMPECTOMY Right 12/29/2010   snbx, ER/PR +, Her2 -, 0/1 node pos.   CARPAL TUNNEL RELEASE Bilateral 9/12,8,12   CHOLECYSTECTOMY     COLONOSCOPY N/A 02/09/2013   Procedure: COLONOSCOPY;  Surgeon: [name], MD;  [facility]: [facility];  Service: Endoscopy;  Laterality: N/A;   COLONOSCOPY     CORONARY ANGIOPLASTY     CORONARY ARTERY BYPASS GRAFT N/A 05/04/2020   Procedure: CORONARY ARTERY BYPASS GRAFTING (CABG), ON PUMP, TIMES TWO, USING LEFT INTERNAL MAMMARY ARTERY AND RIGHT ENDOSCOPICALLY HARVESTED GREATER SAPHENOUS VEIN;  Surgeon: Rexene Alberts, MD;  Location: [facility];  Service: Open Heart Surgery;  Laterality: N/A;   DILATION AND CURETTAGE OF UTERUS     ESOPHAGOGASTRODUODENOSCOPY N/A 02/09/2013   Procedure: ESOPHAGOGASTRODUODENOSCOPY (EGD);  Surgeon: [name], MD;  Location: [name];  Service: Endoscopy;  Laterality: N/A;   FOOT SPURS     HYSTEROSCOPY WITH D & C N/A 09/11/2012   Procedure: DILATATION AND CURETTAGE ;  Surgeon: [name], MD;  Location: [facility];  Service: Gynecology;  Laterality: N/A;   KNEE SURGERY  2007   LAPAROSCOPIC ASSISTED VAGINAL HYSTERECTOMY N/A 04/05/2016   Procedure: LAPAROSCOPIC ASSISTED VAGINAL HYSTERECTOMY possible BSO;  Surgeon: [name], MD;  Location: [facility];  Service: Gynecology;  Laterality: N/A;    LUMBAR LAMINECTOMY/DECOMPRESSION MICRODISCECTOMY N/A 10/20/2014   Procedure: MICRO LUMBER DECOMPRESSION L3-4 L4-5;  Surgeon: [name], MD;  [facility];  Service: Orthopedics;  Laterality: N/A;   LUMBAR LAMINECTOMY/DECOMPRESSION MICRODISCECTOMY Bilateral 10/13/2015   Procedure: MICRO LUMBAR DECOMPRESSION L5 - S1 AND REDO DECOMPRESSION L4 - L5 AND REMOVAL OF FACET CYST L4 - L5 2 LEVELS;  Surgeon: [name], MD;  [facility];  Service: Orthopedics;  Laterality: Bilateral;   PERIPHERAL VASCULAR INTERVENTION Right 04/29/2019   Procedure: PERIPHERAL VASCULAR INTERVENTION;  Surgeon: Marty Heck, MD;  Location: [facility];  Service: Cardiovascular;  Laterality: Right;   PORT-A-CATH REMOVAL  04/17/2011   Procedure: REMOVAL PORT-A-CATH;  Surgeon: [name], MD;  Location: [facility];  Service: General;  Laterality: Left;   PORTACATH PLACEMENT  02/07/2011   Procedure: INSERTION PORT-A-CATH;  Surgeon: [name], MD;  [facility];  Service: General;  Laterality: N/A;   RIGHT/LEFT HEART CATH AND CORONARY ANGIOGRAPHY N/A 03/17/2020   Procedure: RIGHT/LEFT HEART CATH AND CORONARY ANGIOGRAPHY;  Surgeon: Burnell Blanks, MD;  Location: [facility];  Service: Cardiovascular;  Laterality: N/A;   TEE WITHOUT CARDIOVERSION N/A 05/04/2020   Procedure: TRANSESOPHAGEAL ECHOCARDIOGRAM (TEE);  Surgeon: Darylene Price  H, MD;  Location: [facility];  Service: Open Heart Surgery;  Laterality: N/A;   UPPER GASTROINTESTINAL ENDOSCOPY     Family History  Problem Relation Age of Onset   Kidney disease Mother    Heart disease Mother    Throat cancer Father    Alcoholism Father    Heart attack Father    Lymphoma Brother 41   Heart attack Brother    Diabetes Brother    Hypertension Brother    Cancer Son    Colon cancer Neg Hx    Stroke Neg Hx    Esophageal cancer Neg Hx    Rectal cancer Neg Hx    Stomach cancer Neg Hx    Outpatient Medications  Prior to Visit  Medication Sig Dispense Refill   Accu-Chek FastClix Lancets MISC USE TO CHECK BLOOD SUGARS 3 TIMES DAILY 306 each 3   acetaminophen (TYLENOL) 500 MG tablet Take 500 mg by mouth every 6 (six) hours as needed for mild pain or headache.     ascorbic acid (VITAMIN C) 250 MG tablet Take 1 tablet (250 mg total) by mouth 2 (two) times daily. 60 tablet 0   aspirin EC 81 MG tablet Take 81 mg by mouth in the morning.     blood glucose meter kit and supplies Dispense based on patient and insurance preference. Use up to four times daily as directed. (FOR ICD-10 E10.9, E11.9). 1 each 0   Blood Glucose Monitoring Suppl DEVI by Does not apply route. Accu Check Guide     Cholecalciferol (VITAMIN D3) 25 MCG (1000 UT) CAPS Take 2,000 Units by mouth daily before lunch.     docusate sodium (COLACE) 100 MG capsule Take 1 capsule (100 mg total) by mouth 2 (two) times daily. 60 capsule 0   Evolocumab (REPATHA SURECLICK) 342 MG/ML SOAJ Inject 1 pen into the skin every 14 (fourteen) days. (Patient taking differently: Inject 140 mg into the skin every 14 (fourteen) days.) 2 mL 11   glipiZIDE (GLUCOTROL XL) 10 MG 24 hr tablet Take 1 tablet (10 mg total) by mouth daily with breakfast. 90 tablet 3   glucose blood (ACCU-CHEK GUIDE) test strip TEST ONCE DAILY 100 each 5   glucose blood test strip 1 each by Other route as needed for other. Use as instructed  Please dispense Accu check Guide Test strips     Golimumab (SIMPONI ARIA IV) Inject into the vein every 2 (two) months.     icosapent Ethyl (VASCEPA) 1 g capsule Take 2 capsules (2 g total) by mouth 2 (two) times daily. 120 capsule 11   insulin glargine (LANTUS) 100 UNIT/ML Solostar Pen Inject 15 Units into the skin 2 (two) times daily. 15 mL 0   Insulin Pen Needle (CAREFINE PEN NEEDLES) 32G X 6 MM MISC 1 application by Does not apply route 2 (two) times daily. 120 each 1   levothyroxine (SYNTHROID) 100 MCG tablet Take 100 mcg by mouth daily before  breakfast.     metoprolol tartrate (LOPRESSOR) 50 MG tablet Take 1 tablet (50 mg total) by mouth 2 (two) times daily. 60 tablet 0   Multiple Vitamin (MULTIVITAMIN WITH MINERALS) TABS tablet Take 1 tablet by mouth daily.     nortriptyline (PAMELOR) 10 MG capsule Take 1 capsule (10 mg total) by mouth at bedtime. 30 capsule 1   pantoprazole (PROTONIX) 40 MG tablet Take 1 tablet (40 mg total) by mouth daily before breakfast.     PARoxetine (PAXIL) 20 MG tablet Take  1 tablet (20 mg total) by mouth in the morning. 90 tablet 3   Semaglutide,0.25 or 0.5MG/DOS, (OZEMPIC, 0.25 OR 0.5 MG/DOSE,) 2 MG/1.5ML SOPN Inject 0.5 mg into the skin once a week. (Patient taking differently: Inject 0.5 mg into the skin every Monday.) 1.5 mL 6   traMADol (ULTRAM) 50 MG tablet Take 1 tablet (50 mg total) by mouth every 12 (twelve) hours. 60 tablet 0   traZODone (DESYREL) 50 MG tablet Take 0.5 tablets (25 mg total) by mouth at bedtime as needed for sleep. 30 tablet 6   valACYclovir (VALTREX) 1000 MG tablet Take 1 tablet (1,000 mg total) by mouth daily as needed (Fever Blisters). 180 tablet 0   zinc sulfate 220 (50 Zn) MG capsule Take 1 capsule (220 mg total) by mouth daily. 30 capsule 0   calcium carbonate (TUMS - DOSED IN MG ELEMENTAL CALCIUM) 500 MG chewable tablet Chew 3 tablets by mouth at bedtime.     HYDROcodone-acetaminophen (NORCO) 7.5-325 MG tablet Take 1 tablet by mouth every 6 (six) hours as needed for severe pain. 28 tablet 0   No facility-administered medications prior to visit.   Allergies  Allergen Reactions   Codeine Nausea And Vomiting and Other (See Comments)    Severe stomach cramps, also   Antihistamines, Diphenhydramine-Type Other (See Comments)    Causes hyperactivity   Gabapentin     Urinary incontinence   Lyrica [Pregabalin]     Double vision/falls   Oxycodone     Hallucinations/crazy feelings   Statins Other (See Comments)    Joint pains   Sulfa Antibiotics Other (See Comments)     Reaction not recalled   Erythromycin Hives, Rash and Other (See Comments)    Allergic due to dental work from 50 years ago. It was used in packing and resulted in rash/hives inside and outside of mouth.   Trulicity [Dulaglutide] Itching     Objective:   Today's Vitals   03/29/21 1112  BP: 130/74  Pulse: 82  Temp: 97.6 F (36.4 C)  TempSrc: Temporal  SpO2: 97%  Weight: 135 lb (61.2 kg)  Height: _0  (1.6 m)   Body mass index is 23.91 kg/m.   General: Well developed, well nourished. No acute distress. Extremities: Left AKA. Wound is healing well, without redness or discharge. Staples in place.   Right foot has normal temperature. there is a small cut on the right 1st toe, which does not   appear infected. Skin: Warm and dry. No rashes. Neuro: CN II-XII intact. Normal sensation and DTR bilaterally. Psych: Alert and oriented. Normal mood and affect.  Health Maintenance Due  Topic Date Due   Hepatitis C Screening  Never done   TETANUS/TDAP  Never done   Zoster Vaccines- Shingrix (2 of 2) 03/29/2011   Lab Results BMP Latest Ref Rng & Units 03/16/2021 03/14/2021 03/12/2021  Glucose 70 - 99 mg/dL 90 138(H) 222(H)  BUN 8 - 23 mg/dL 29(H) 26(H) 31(H)  Creatinine 0.44 - 1.00 mg/dL 1.50(H) 1.44(H) 1.26(H)  BUN/Creat Ratio 12 - 28 - - -  Sodium 135 - 145 mmol/L 135 136 133(L)  Potassium 3.5 - 5.1 mmol/L 4.0 5.3(H) 4.7  Chloride 98 - 111 mmol/L 101 101 100  CO2 22 - 32 mmol/L _1 Calcium 8.9 - 10.3 mg/dL 8.8(L) 9.2 8.8(L)   CBC Latest Ref Rng & Units 03/14/2021 03/12/2021 03/11/2021  WBC 4.0 - 10.5 K/uL 11.7(H) 9.4 8.9  Hemoglobin 12.0 - 15.0 g/dL 10.9(L) 10.0(L) 9.2(L)  Hematocrit 36.0 - 46.0 % 32.9(L) 31.6(L) 29.8(L)  Platelets 150 - 400 K/uL 428(H) 373 346     Assessment & Plan:   1. 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[place]) I completed a medication reconciliation with her discharge summary. I reviewed discharge summaries from both hospitalization and rehab  stays. She appears to have needed services at home and aides in the home for her to function. She will follow-up with her surgeon as scheduled.  2. PAD with bilateral claudication I cautioned Ms. Fauble to keep a close eye on the small cut on her right great toe. She is at risk for needing an amputation of the right leg int he future.  3. Type 2 diabetes mellitus with diabetic neuropathy, without long-term current use of insulin ([facility]) Blood sugars appear adequately controlled with insulin. I will plan to recheck her A1c at her next appointment.  Return in about 3 months (around 06/29/2021) for Reassessment.   [name], MD

## 2021-04-05 ENCOUNTER — Other Ambulatory Visit: Payer: Self-pay

## 2021-04-05 ENCOUNTER — Encounter: Payer: Self-pay | Admitting: Physician Assistant

## 2021-04-05 ENCOUNTER — Encounter (HOSPITAL_COMMUNITY): Payer: Medicare Other

## 2021-04-05 ENCOUNTER — Ambulatory Visit (INDEPENDENT_AMBULATORY_CARE_PROVIDER_SITE_OTHER): Payer: Medicare Other | Admitting: Physician Assistant

## 2021-04-05 VITALS — BP 157/84 | HR 81 | Temp 97.6°F | Ht 63.0 in

## 2021-04-05 DIAGNOSIS — I739 Peripheral vascular disease, unspecified: Secondary | ICD-10-CM

## 2021-04-05 NOTE — Progress Notes (Signed)
?POST OPERATIVE OFFICE NOTE ? ? ? ?CC:  F/u for surgery ? ?HPI:  History of Present Illness:  Patient is a 81 y.o. year old female who presents for evaluation of PAD.  She has history of right Right SFA angioplasty with stent placement (predilated with a 4 mm Mustang, stented with 6 mm x 120 mm drug-coated Eluvia and 6 mm x 80 mm drug-coated Eluvia postdilated with a 5 mm Mustang) 04/29/19 by Dr. Carlis Abbott. Lifestyle limiting claudication.     ?More recently she had a left Dr. Stanford Breed performed an angiogram on the left LE for nonhealing ulcer on left great toe which shoed no inflow to the foot with occluded tibial.  She had a left AKA by Dr. Virl Cagey on 03/06/21.   ? She is here today for left AKA incision check and staple removal. ? ?Allergies  ?Allergen Reactions  ? Codeine Nausea And Vomiting and Other (See Comments)  ?  Severe stomach cramps, also  ? Antihistamines, Diphenhydramine-Type Other (See Comments)  ?  Causes hyperactivity  ? Gabapentin   ?  Urinary incontinence  ? Lyrica [Pregabalin]   ?  Double vision/falls  ? Oxycodone   ?  Hallucinations/crazy feelings  ? Statins Other (See Comments)  ?  Joint pains  ? Sulfa Antibiotics Other (See Comments)  ?  Reaction not recalled  ? Erythromycin Hives, Rash and Other (See Comments)  ?  Allergic due to dental work from 50 years ago. It was used in packing and resulted in rash/hives inside and outside of mouth.  ? Trulicity [Dulaglutide] Itching  ? ? ?Current Outpatient Medications  ?Medication Sig Dispense Refill  ? Accu-Chek FastClix Lancets MISC USE TO CHECK BLOOD SUGARS 3 TIMES DAILY 306 each 3  ? acetaminophen (TYLENOL) 500 MG tablet Take 500 mg by mouth every 6 (six) hours as needed for mild pain or headache.    ? ascorbic acid (VITAMIN C) 250 MG tablet Take 1 tablet (250 mg total) by mouth 2 (two) times daily. 60 tablet 0  ? aspirin EC 81 MG tablet Take 81 mg by mouth in the morning.    ? blood glucose meter kit and supplies Dispense based on patient and insurance  preference. Use up to four times daily as directed. (FOR ICD-10 E10.9, E11.9). 1 each 0  ? Blood Glucose Monitoring Suppl DEVI by Does not apply route. Accu Check Guide    ? Cholecalciferol (VITAMIN D3) 25 MCG (1000 UT) CAPS Take 2,000 Units by mouth daily before lunch.    ? docusate sodium (COLACE) 100 MG capsule Take 1 capsule (100 mg total) by mouth 2 (two) times daily. 60 capsule 0  ? Evolocumab (REPATHA SURECLICK) 536 MG/ML SOAJ Inject 1 pen into the skin every 14 (fourteen) days. (Patient taking differently: Inject 140 mg into the skin every 14 (fourteen) days.) 2 mL 11  ? glipiZIDE (GLUCOTROL XL) 10 MG 24 hr tablet Take 1 tablet (10 mg total) by mouth daily with breakfast. 90 tablet 3  ? glucose blood (ACCU-CHEK GUIDE) test strip TEST ONCE DAILY 100 each 5  ? glucose blood test strip 1 each by Other route as needed for other. Use as instructed ? ?Please dispense Accu check Guide Test strips    ? Golimumab (SIMPONI ARIA IV) Inject into the vein every 2 (two) months.    ? icosapent Ethyl (VASCEPA) 1 g capsule Take 2 capsules (2 g total) by mouth 2 (two) times daily. 120 capsule 11  ? insulin glargine (LANTUS) 100  UNIT/ML Solostar Pen Inject 15 Units into the skin 2 (two) times daily. 15 mL 0  ? Insulin Pen Needle (CAREFINE PEN NEEDLES) 32G X 6 MM MISC 1 application by Does not apply route 2 (two) times daily. 120 each 1  ? levothyroxine (SYNTHROID) 100 MCG tablet Take 100 mcg by mouth daily before breakfast.    ? metoprolol tartrate (LOPRESSOR) 50 MG tablet Take 1 tablet (50 mg total) by mouth 2 (two) times daily. 60 tablet 0  ? Multiple Vitamin (MULTIVITAMIN WITH MINERALS) TABS tablet Take 1 tablet by mouth daily.    ? nortriptyline (PAMELOR) 10 MG capsule Take 1 capsule (10 mg total) by mouth at bedtime. 30 capsule 1  ? pantoprazole (PROTONIX) 40 MG tablet Take 1 tablet (40 mg total) by mouth daily before breakfast.    ? PARoxetine (PAXIL) 20 MG tablet Take 1 tablet (20 mg total) by mouth in the morning. 90  tablet 3  ? traMADol (ULTRAM) 50 MG tablet Take 1 tablet (50 mg total) by mouth every 12 (twelve) hours. 60 tablet 0  ? traZODone (DESYREL) 50 MG tablet Take 0.5 tablets (25 mg total) by mouth at bedtime as needed for sleep. 30 tablet 6  ? valACYclovir (VALTREX) 1000 MG tablet Take 1 tablet (1,000 mg total) by mouth daily as needed (Fever Blisters). 180 tablet 0  ? zinc sulfate 220 (50 Zn) MG capsule Take 1 capsule (220 mg total) by mouth daily. 30 capsule 0  ? ?No current facility-administered medications for this visit.  ? ? ? ROS:  See HPI ? ?Physical Exam: ? ? ? ? ? ? ?Incision:  left AKA fully healed staples removed patient tolerated this well. ?Right GT ulcer healing well ?Extremities:  stump without hematoma, erythema or skin ischemic changes ? ? ? ?Assessment/Plan:  This is a 81 y.o. female who is s/p:right Right SFA angioplasty with stent placement (predilated with a 4 mm Mustang, stented with 6 mm x 120 mm drug-coated Eluvia and 6 mm x 80 mm drug-coated Eluvia postdilated with a 5 mm Mustang) 04/29/19 by Dr. Carlis Abbott. Lifestyle limiting claudication.     ?More recently she had a left Dr. Stanford Breed performed an angiogram on the left LE for nonhealing ulcer on left great toe which shoed no inflow to the foot with occluded tibial.  She had a left AKA by Dr. Virl Cagey on 03/06/21.   ? ? The patient has a left Above Knee Amputation. The patient is well motivated to return to their prior functional status by utilizing a prosthesis to perform ADL's and maintain a healthy lifestyle. The patient has the physical and cognitive capacity to function with a prosthesis.  ? ?Functional Level: ?K2 Limited Community Ambulator: Has the ability or potential for ambulation and to traverse low environmental barriers such as curbs, stairs or uneven surfaces ? ?Residual Limb History: ?The skin condition of the residual limb is healthy. The patient will continue to monitor the skin of the residual limb and follow hygiene instructions.  ?The  patient is experiencing no pain related to amputation ? ?Prosthetic Prescription Plan: ?Counseling and education regarding prosthetic management will be provided to the patient via a certified prosthetist. ?A multi-discipline team, including physical therapy, will manage the prosthetic fabrication, fitting and prosthetic gait training.  ? ? ?She will f/u in 5 months for right LE arterial duplex and ABI's.  If she develops ischemic symptoms to the right LE she will call. ? ? ?Roxy Horseman ?PA-C ?[facility] ?  [phone] ? ? ?Clinic MD:  Donzetta Matters ?

## 2021-04-07 DIAGNOSIS — I2510 Atherosclerotic heart disease of native coronary artery without angina pectoris: Secondary | ICD-10-CM

## 2021-04-07 DIAGNOSIS — Z89612 Acquired absence of left leg above knee: Secondary | ICD-10-CM | POA: Diagnosis not present

## 2021-04-07 DIAGNOSIS — Z4781 Encounter for orthopedic aftercare following surgical amputation: Secondary | ICD-10-CM | POA: Diagnosis not present

## 2021-04-07 DIAGNOSIS — E1142 Type 2 diabetes mellitus with diabetic polyneuropathy: Secondary | ICD-10-CM | POA: Diagnosis not present

## 2021-04-07 DIAGNOSIS — N184 Chronic kidney disease, stage 4 (severe): Secondary | ICD-10-CM

## 2021-04-07 DIAGNOSIS — D631 Anemia in chronic kidney disease: Secondary | ICD-10-CM

## 2021-04-07 DIAGNOSIS — E1151 Type 2 diabetes mellitus with diabetic peripheral angiopathy without gangrene: Secondary | ICD-10-CM | POA: Diagnosis not present

## 2021-04-07 DIAGNOSIS — M48061 Spinal stenosis, lumbar region without neurogenic claudication: Secondary | ICD-10-CM

## 2021-04-07 DIAGNOSIS — I10 Essential (primary) hypertension: Secondary | ICD-10-CM

## 2021-04-07 DIAGNOSIS — E1122 Type 2 diabetes mellitus with diabetic chronic kidney disease: Secondary | ICD-10-CM

## 2021-04-10 ENCOUNTER — Other Ambulatory Visit: Payer: Self-pay | Admitting: *Deleted

## 2021-04-10 ENCOUNTER — Telehealth: Payer: Self-pay | Admitting: Family Medicine

## 2021-04-10 DIAGNOSIS — I739 Peripheral vascular disease, unspecified: Secondary | ICD-10-CM

## 2021-04-10 NOTE — Telephone Encounter (Signed)
Needs Handicap pass ... since her leg was amputated.  ?

## 2021-04-18 ENCOUNTER — Telehealth: Payer: Self-pay | Admitting: Family Medicine

## 2021-04-18 NOTE — Telephone Encounter (Signed)
Pt's husband brought up Disability Parking Placard paperwork for Dr. Gena Fray to fill out.I have placed it in his folder upfront. Please call [name] when it's completed at [phone]. ?

## 2021-04-19 NOTE — Telephone Encounter (Signed)
Called and left VM that form was completed and ready for pick up.  Dm/cma ? ?

## 2021-04-20 ENCOUNTER — Telehealth: Payer: Self-pay | Admitting: Family Medicine

## 2021-04-20 NOTE — Telephone Encounter (Signed)
Pt was prescribed trazadone to help her sleep. It isn't really working. It is 25 mg, she wants to know if it's ok if she takes 50 mg? ?

## 2021-04-21 NOTE — Telephone Encounter (Signed)
Lft VM to rtn call. Dm/cma  

## 2021-04-21 NOTE — Telephone Encounter (Signed)
Patient notified VIA phone.  No questions.  Dm/cma ? ?

## 2021-04-24 ENCOUNTER — Telehealth: Payer: Self-pay

## 2021-04-24 NOTE — Telephone Encounter (Signed)
Returned call to pt - called in to report slight swelling of R foot which started today. Pt denied redness, numbness, warmth, color changes, pain or fever Pt wanted to know if she needed to be seen. Advised pt to keep leg/foot elevated as much as possible and and to monitor her foot for increased swelling, pain, color changes or warmth. Advised her to give our office a call or report to the nearest ED if she experienced any of the above symptoms. Pt verbalized understanding and agreed with this plan. ?

## 2021-04-26 ENCOUNTER — Telehealth: Payer: Self-pay | Admitting: *Deleted

## 2021-05-04 ENCOUNTER — Telehealth: Payer: Self-pay

## 2021-05-04 NOTE — Progress Notes (Signed)
? ? ?Chronic Care Management ?Pharmacy Assistant  ? ?Name: [name]  MRN: [id number] DOB: 1940/04/18 ? ?Reason for Encounter: Medication Review/General Adherence Call,. ?  ?Recent office visits:  ?04/20/2021 Dr. Gena Fray MD (PCP) Increase Lanell Persons to 50 mg at bedtime ?03/29/2021 Dr. Gena Fray MD (PCP) No medication Changes noted ?02/22/2021 Dr. Gena Fray MD (PCP) Start Lantus 10 units daily, restart Furosemide once daily, return in 2 weeks ?02/13/2021 Dr. Gena Fray MD (PCP) Start Nystatin 500,000 units 4 times daily ?02/02/2021 Dr. Gena Fray MD (PCP) Increase Levothyroxine to 125 mcg daily, Change Ozempic to 0.5 mg weekly,  start Tramadol 50 mg PRN, Follow up in 3 months ?01/25/2021 Dr. Gena Fray MD (PCP) No Medication Changes noted ?01/20/2021 Dr. Ethelene Hal MD (PCP) Start Augmentin 875-125 mg daily, Start Benzonatate 200 mg PRN, Start Prednisone 10 mg 2 times daily ?01/03/2021 Dr. Gena Fray MD (PCP) Start Ozempic 0.25 weekly, stop Januvia ? ?Recent consult visits:  ?04/05/2021 Laurence Slate PA_C (Vascular Surgery) No Medication Changes noted ?03/21/2021 Octavio Graves RN (Social Services) No Medication Changes noted ?03/06/2021 Dr. [name] MD - Left above knee amputation ?02/24/2021 Dr. Johnsie Cancel MD (Cardiology) Start Colchicine 0.6 mg 2 times daily, Start Pregabalin 75 mg 2 times daily, stop Amitriptyline  ?02/21/2021 Karl Ito NP (Pain Medicine) No Medication Changes noted ? ?Hospital visits:  ?Medication Reconciliation was completed by comparing discharge summary, patient?s EMR and Pharmacy list, and upon discussion with patient. ? ?Admitted to the hospital on 02/28/2021 due to Acute kidney injury. Discharge date was 03/13/2021. Discharged from [facility].   ? ?New?Medications Started at [facility] Discharge:?? ?-started None ID ? ?Medication Changes at Hospital Discharge: ?-Changed Increased metoprolol to 50 mg twice daily ? -Increase home Lantus from 10 to 20 units daily ?- Hold Losartan until 03/16/21 ?  ? ?Medications Discontinued  at Hospital Discharge: ?-Stopped Colchicine ?- Stopped Magnesium ?- Stopped Nystatin ? ?Medications that remain the same after Hospital Discharge:??  ?-All other medications will remain the same.  ? ?Admitted to the hospital on 02/05/2021 due to Epiglottitis. Discharge date was 02/07/2021. Discharged from [facility].   ? ?New?Medications Started at [facility] Discharge:?? ?-started Augmentin 875-125 mg 1 tablet 2 times daily ?-Started Doxycycline 100 mg 2 times daily ?Started Decadron 4 mg daily  ? ?Medication Changes at Hospital Discharge: ?-Changed None  ? ?Medications Discontinued at Hospital Discharge: ?-Stopped Furosemide ? ?Medications that remain the same after Hospital Discharge:??  ?-All other medications will remain the same.   ? ?Medications: ?Outpatient Encounter Medications as of 05/04/2021  ?Medication Sig  ? Accu-Chek FastClix Lancets MISC USE TO CHECK BLOOD SUGARS 3 TIMES DAILY  ? acetaminophen (TYLENOL) 500 MG tablet Take 500 mg by mouth every 6 (six) hours as needed for mild pain or headache.  ? ascorbic acid (VITAMIN C) 250 MG tablet Take 1 tablet (250 mg total) by mouth 2 (two) times daily.  ? aspirin EC 81 MG tablet Take 81 mg by mouth in the morning.  ? blood glucose meter kit and supplies Dispense based on patient and insurance preference. Use up to four times daily as directed. (FOR ICD-10 E10.9, E11.9).  ? Blood Glucose Monitoring Suppl DEVI by Does not apply route. Accu Check Guide  ? Cholecalciferol (VITAMIN D3) 25 MCG (1000 UT) CAPS Take 2,000 Units by mouth daily before lunch.  ? docusate sodium (COLACE) 100 MG capsule Take 1 capsule (100 mg total) by mouth 2 (two) times daily.  ? Evolocumab (REPATHA SURECLICK) 638 MG/ML SOAJ Inject 1 pen into the  skin every 14 (fourteen) days. (Patient taking differently: Inject 140 mg into the skin every 14 (fourteen) days.)  ? glipiZIDE (GLUCOTROL XL) 10 MG 24 hr tablet Take 1 tablet (10 mg total) by mouth daily with breakfast.  ? glucose blood  (ACCU-CHEK GUIDE) test strip TEST ONCE DAILY  ? glucose blood test strip 1 each by Other route as needed for other. Use as instructed ? ?Please dispense Accu check Guide Test strips  ? Golimumab (SIMPONI ARIA IV) Inject into the vein every 2 (two) months.  ? icosapent Ethyl (VASCEPA) 1 g capsule Take 2 capsules (2 g total) by mouth 2 (two) times daily.  ? insulin glargine (LANTUS) 100 UNIT/ML Solostar Pen Inject 15 Units into the skin 2 (two) times daily.  ? Insulin Pen Needle (CAREFINE PEN NEEDLES) 32G X 6 MM MISC 1 application by Does not apply route 2 (two) times daily.  ? levothyroxine (SYNTHROID) 100 MCG tablet Take 100 mcg by mouth daily before breakfast.  ? metoprolol tartrate (LOPRESSOR) 50 MG tablet Take 1 tablet (50 mg total) by mouth 2 (two) times daily.  ? Multiple Vitamin (MULTIVITAMIN WITH MINERALS) TABS tablet Take 1 tablet by mouth daily.  ? nortriptyline (PAMELOR) 10 MG capsule Take 1 capsule (10 mg total) by mouth at bedtime.  ? pantoprazole (PROTONIX) 40 MG tablet Take 1 tablet (40 mg total) by mouth daily before breakfast.  ? PARoxetine (PAXIL) 20 MG tablet Take 1 tablet (20 mg total) by mouth in the morning.  ? traMADol (ULTRAM) 50 MG tablet Take 1 tablet (50 mg total) by mouth every 12 (twelve) hours.  ? traZODone (DESYREL) 50 MG tablet Take 0.5 tablets (25 mg total) by mouth at bedtime as needed for sleep.  ? valACYclovir (VALTREX) 1000 MG tablet Take 1 tablet (1,000 mg total) by mouth daily as needed (Fever Blisters).  ? zinc sulfate 220 (50 Zn) MG capsule Take 1 capsule (220 mg total) by mouth daily.  ? ?No facility-administered encounter medications on file as of 05/04/2021.  ? ? ?  ?Care Gaps: ?Hepatitis C Screening ?Tetanus Vaccine ?Shingrix Vaccine  ? ?  ?Star Rating Drugs: ?Glipizide 10 mg last filled 04/30/2021 90 day supply at [facility]. ?Repatha 140 mg last filled 03/26/203 28 day supply at [facility]. ? ? ?Medication Fill Gaps: ?None ID ? ?Called patient and  discussed medication adherence  with patient, No issues at this time with current medication. ? ? Patient Reports ED visit since her last CPP follow up.  ?Patient Denies  any side effects with her medication. ?Patient Denies  any problems with hercurrent pharmacy ? ?I attempted to schedule a follow up with the clinical pharmacist, but patient denied at this time.Patient states she has a lot of appointments due to her left being amputated, but will return my call to schedule appointment once everything gets settle. ? ?Anderson Malta ?Clinical Pharmacist Assistant ?[phone]  ? ? ?

## 2021-05-05 ENCOUNTER — Encounter: Payer: Medicare Other | Attending: Physical Medicine & Rehabilitation | Admitting: Physical Medicine & Rehabilitation

## 2021-05-05 ENCOUNTER — Encounter: Payer: Self-pay | Admitting: Physical Medicine & Rehabilitation

## 2021-05-05 ENCOUNTER — Ambulatory Visit: Payer: Medicare Other | Admitting: Family Medicine

## 2021-05-05 VITALS — BP 167/80 | HR 79 | Ht 63.0 in | Wt 135.0 lb

## 2021-05-05 DIAGNOSIS — S78112A Complete traumatic amputation at level between left hip and knee, initial encounter: Secondary | ICD-10-CM | POA: Diagnosis present

## 2021-05-05 NOTE — Progress Notes (Signed)
? ?Subjective:  ? ? Patient ID: [name], female    DOB: 1940/12/04, 81 y.o.   MRN: [id number] ? 81 y.o. female with history of T2DM with polyneuropathy, breast cancer, CAD, CKD 4, recent COVID infection 01/2021 who was admitted on 02/28/2021 with BLE weakness with difficulty walking, fall, reports of worsening of neuropathy with transient diplopia.  She was found to have elevated troponin and positive H. influenzae markers due to recent infection.  2D echo done showing EF 55 to 60%.  She was briefly treated with IV heparin and as patient is symptomatic cardiology recommended medical management.  MRI brain was negative for acute changes.  ?  ?She was found to have nonhealing ulcer on left great toe with bilateral heel wounds and rest pain.  LLE angiogram revealed diffuse disease throughout leg without options for revascularization and she was agreeable to undergo left-AKA by Dr. Unk Lightning on 02/13.  Postop had improvement in pain but continued to be limited by weakness with orthostatic changes, tachycardia and was noted to have decline in functional status.  CIR was recommended due to functional decline. ?Admit date: 03/13/2021 ?Discharge date: 03/18/2021 ?At admission, patient required min assist with basic ADL tasks and with mobility.  She  has had improvement in activity tolerance, balance, postural control as well as ability to compensate for deficits. She is able to complete ADL tasks with supervision. She is able to complete transfers with supervision and is using WC at modified independent level.    ? ?HPI ? ?Doing ok with post op pain  ?Difficulty using rolling walker  ? ?Cleared by [facility] for prosthetic fitting ? ?Using Manual WC for all mobility  ?Pain Inventory ?Average Pain 3 ?Pain Right Now 3 ?My pain is intermittent and aching ? ?In the last 24 hours, has pain interfered with the following? ?General activity 0 ?Relation with others 0 ?Enjoyment of life 0 ?What TIME of day is your pain at its worst?  varies ?Sleep (in general) Poor ? ?Pain is worse with: some activites ?Pain improves with: rest ?Relief from Meds:  na ? ?use a wheelchair ? ?retired ? ?trouble walking ? ?Any changes since last visit?  yes finished Roy therapy and will start outpt. Going to get fitted for prosthetic next week. Staples out of stump ? ?Any changes since last visit?  no .  has seen surgeon and PCP ? ? ? ?Family History  ?Problem Relation Age of Onset  ? Kidney disease Mother   ? Heart disease Mother   ? Throat cancer Father   ? Alcoholism Father   ? Heart attack Father   ? Lymphoma Brother 10  ? Heart attack Brother   ? Diabetes Brother   ? Hypertension Brother   ? Cancer Son   ? Colon cancer Neg Hx   ? Stroke Neg Hx   ? Esophageal cancer Neg Hx   ? Rectal cancer Neg Hx   ? Stomach cancer Neg Hx   ? ?Social History  ? ?Socioeconomic History  ? Marital status: Married  ?  Spouse name: Not on file  ? Number of children: 3  ? Years of education: Not on file  ? Highest education level: Not on file  ?Occupational History  ? Occupation: retired-bookkeeping  ?Tobacco Use  ? Smoking status: Former  ?  Packs/day: 2.00  ?  Years: 40.00  ?  Pack years: 80.00  ?  Types: Cigarettes  ?  Quit date: 12/19/1997  ?  Years since quitting:  23.3  ? Smokeless tobacco: Never  ?Vaping Use  ? Vaping Use: Never used  ?Substance and Sexual Activity  ? Alcohol use: Yes  ?  Alcohol/week: 1.0 standard drink  ?  Types: 1 Glasses of wine per week  ?  Comment: occasiona - maybe once a month  ? Drug use: No  ? Sexual activity: Not Currently  ?  Comment: menarch age 36, P27, HRT X 10 YRS, MENOPAUSE MID 40'S  ?Other Topics Concern  ? Not on file  ?Social History Narrative  ? One daughter died from suicide at age 36.  ? ?Social Determinants of Health  ? ?Financial Resource Strain: Low Risk   ? Difficulty of Paying Living Expenses: Not hard at all  ?Food Insecurity: No Food Insecurity  ? Worried About Charity fundraiser in the Last Year: Never true  ? Ran Out of Food in  the Last Year: Never true  ?Transportation Needs: No Transportation Needs  ? Lack of Transportation (Medical): No  ? Lack of Transportation (Non-Medical): No  ?Physical Activity: Insufficiently Active  ? Days of Exercise per Week: 2 days  ? Minutes of Exercise per Session: 20 min  ?Stress: No Stress Concern Present  ? Feeling of Stress : Only a little  ?Social Connections: Moderately Integrated  ? Frequency of Communication with Friends and Family: Three times a week  ? Frequency of Social Gatherings with Friends and Family: Twice a week  ? Attends Religious Services: More than 4 times per year  ? Active Member of Clubs or Organizations: No  ? Attends Archivist Meetings: Never  ? Marital Status: Married  ? ?Past Surgical History:  ?Procedure Laterality Date  ? ABDOMINAL AORTOGRAM W/LOWER EXTREMITY Bilateral 04/29/2019  ? Procedure: ABDOMINAL AORTOGRAM W/LOWER EXTREMITY;  Surgeon: Marty Heck, MD;  Location: [place] [facility];  Service: Cardiovascular;  Laterality: Bilateral;  ? ABDOMINAL AORTOGRAM W/LOWER EXTREMITY Left 07/01/2019  ? Procedure: ABDOMINAL AORTOGRAM W/ Left LOWER EXTREMITY [place];  Surgeon: Marty Heck, MD;  Location: [place] [facility];  Service: Cardiovascular;  Laterality: Left;  ? ABDOMINAL AORTOGRAM W/LOWER EXTREMITY Left 03/03/2021  ? Procedure: ABDOMINAL AORTOGRAM W/LOWER EXTREMITY;  Surgeon: Cherre Robins, MD;  Location: [place] [facility];  Service: Cardiovascular;  Laterality: Left;  ? AMPUTATION Left 03/06/2021  ? Procedure: LEFT ABOVE KNEE AMPUTATION;  Surgeon: [name], MD;  Location: [place];  Service: Vascular;  Laterality: Left;  ? ANTERIOR AND POSTERIOR REPAIR N/A 04/05/2016  ? Procedure: ANTERIOR (CYSTOCELE) AND POSTERIOR REPAIR (RECTOCELE);  Surgeon: [name], MD;  Location: [facility];  Service: Gynecology;  Laterality: N/A;  ? AORTIC VALVE REPLACEMENT N/A 05/04/2020  ? Procedure: AORTIC VALVE REPLACEMENT (AVR) USING INSPIRIS 23MM RESILIA AORTIC  VALVE;  Surgeon: Rexene Alberts, MD;  Location: [place];  Service: Open Heart Surgery;  Laterality: N/A;  ? BREAST BIOPSY    ? BREAST LUMPECTOMY  1983  ? benign biopsy  ? BREAST LUMPECTOMY Right 12/29/2010  ? snbx, ER/PR +, Her2 -, 0/1 node pos.  ? CARPAL TUNNEL RELEASE Bilateral 9/12,8,12  ? CHOLECYSTECTOMY    ? COLONOSCOPY N/A 02/09/2013  ? Procedure: COLONOSCOPY;  Surgeon: [name], MD;  Location: [facility];  Service: Endoscopy;  Laterality: N/A;  ? COLONOSCOPY    ? CORONARY ANGIOPLASTY    ? CORONARY ARTERY BYPASS GRAFT N/A 05/04/2020  ? Procedure: CORONARY ARTERY BYPASS GRAFTING (CABG), ON PUMP, TIMES TWO, USING LEFT INTERNAL MAMMARY ARTERY AND RIGHT ENDOSCOPICALLY HARVESTED  GREATER SAPHENOUS VEIN;  Surgeon: Rexene Alberts, MD;  Location: [place];  Service: Open Heart Surgery;  Laterality: N/A;  ? DILATION AND CURETTAGE OF UTERUS    ? ESOPHAGOGASTRODUODENOSCOPY N/A 02/09/2013  ? Procedure: ESOPHAGOGASTRODUODENOSCOPY (EGD);  Surgeon: [name], MD;  Location: [name];  Service: Endoscopy;  Laterality: N/A;  ? FOOT SPURS    ? HYSTEROSCOPY WITH D & C N/A 09/11/2012  ? Procedure: DILATATION AND CURETTAGE ;  Surgeon: [name], MD;  Location: [facility];  Service: Gynecology;  Laterality: N/A;  ? KNEE SURGERY  2007  ? LAPAROSCOPIC ASSISTED VAGINAL HYSTERECTOMY N/A 04/05/2016  ? Procedure: LAPAROSCOPIC ASSISTED VAGINAL HYSTERECTOMY possible BSO;  Surgeon: [name], MD;  Location: [facility];  Service: Gynecology;  Laterality: N/A;  ? LUMBAR LAMINECTOMY/DECOMPRESSION MICRODISCECTOMY N/A 10/20/2014  ? Procedure: MICRO LUMBER DECOMPRESSION L3-4 L4-5;  Surgeon: [name], MD;  Location: [facility];  Service: Orthopedics;  Laterality: N/A;  ? LUMBAR LAMINECTOMY/DECOMPRESSION MICRODISCECTOMY Bilateral 10/13/2015  ? Procedure: MICRO LUMBAR DECOMPRESSION L5 - S1 AND REDO DECOMPRESSION L4 - L5 AND REMOVAL OF FACET CYST L4 - L5 2 LEVELS;  Surgeon: [name], MD;  Location: [facility];  Service: Orthopedics;   Laterality: Bilateral;  ? PERIPHERAL VASCULAR INTERVENTION Right 04/29/2019  ? Procedure: PERIPHERAL VASCULAR INTERVENTION;  Surgeon: Marty Heck, MD;  Location: [place] [facility];  Service: Cardiovascula

## 2021-05-05 NOTE — Patient Instructions (Signed)
Please call so I can order therapy for you once the prosthesis is ready  ?

## 2021-05-09 ENCOUNTER — Telehealth: Payer: Self-pay

## 2021-05-09 NOTE — Telephone Encounter (Signed)
Patient called in stating she is still having RLE swelling and discoloration. Patient denies pain, loss of sensation in the RLE foot/leg, excessive coldness and/or new wounds. Patient states she does have some red/pale discoloration intermittently. Patient states that her sxs began shortly after she tried a foot mask given to her by her daughter and that she also has some burning in the great toe associated with a in grown toe-nail. I advised patient that she would need to reach out to Podiatry to address the in grown toe-nail and I would schedule her for a sooner appt. Patient voiced her understanding. ?

## 2021-05-10 ENCOUNTER — Other Ambulatory Visit: Payer: Self-pay | Admitting: Physical Medicine and Rehabilitation

## 2021-05-15 NOTE — Progress Notes (Signed)
? ?Virtual Visit via Video Note  ? ?This visit type was conducted due to national recommendations for restrictions regarding the COVID-19 Pandemic (e.g. social distancing) in an effort to limit this patient's exposure and mitigate transmission in our community.  Due to her co-morbid illnesses, this patient is at least at moderate risk for complications without adequate follow up.  This format is felt to be most appropriate for this patient at this time.  All issues noted in this document were discussed and addressed.  A limited physical exam was performed with this format.  Please refer to the patient's chart for her consent to telehealth for [facility].   ? ?Patient Location:  home ?Physician Location: Office ? ? ?Date:  05/24/2021  ? ?ID:  [name], DOB [birth year], MRN [id number] ? ?PCP:  [name], MD ?  ?[facility] Providers ?Cardiologist:  Johnsie Cancel ? ?Patient Profile:   ? ?[name] is a 81 y.o. female with:  ?Coronary artery disease  ?Aortic stenosis ?S/p DES to RCA in 2007 ?Myoview 2017 low risk  ?S/p CABG + AVR 4/22 ?Hypertension  ?Diabetes mellitus  ?Chronic kidney disease  ?Hyperlipidemia  ?Breast CA  ?Peripheral arterial disease  ?S/p R SFA stent ?GERD ?Hypothyroidism  ?Carotid artery disease  ?S/p carotid stent in 2007 ?[place] 4/22: Bilat 1-39 ? ?Prior CV studies: ?Pre-CABG Dopplers 05/02/20 ?Bilateral ICA 1-39  ? ?RIGHT/LEFT HEART CATH AND CORONARY ANGIOGRAPHY 03/17/2020 ?Narrative ?? Ost RCA to Prox RCA lesion is 80% stenosed. ?? Mid RCA lesion is 30% stenosed. ?? RPDA lesion is 60% stenosed. ?? 1st Mrg lesion is 50% stenosed. ?? Prox Cx to Mid Cx lesion is 80% stenosed. ?? Mid LAD lesion is 80% stenosed. ?1. No significant disease in the left main artery ?2. Severe mid LAD stenosis. The LAD bifurcated distally beyond this lesion and the diagonal branch reaches the apex. ?3. Moderate caliber non-dominant Circumflex with severe mid stenosis. The vessel is relatively small beyond the  stenosis. The small to moderate caliber obtuse marginal branch arises prior to the stenosis and has moderate ostial stenosis. ?4. The RCA is a large dominant artery. There is severe, heavily calcified stenosis at the ostium of the RCA. The distal stented segment is patent. There is moderately severe stenosis in the moderate caliber PDA. ?5. Severe aortic stenosis (mean gradient 26.9 mmHg, peak gradient 34 mmHg) ? ?Echocardiogram 02/29/20 ?Moderate-severe aortic stenosis, V-max 3.5 m/s, mean gradient 33 mmHg, DI 0.26, moderate MR, EF 65-70, no RWMA, moderate LVH, normal RVSF ? ?GATED SPECT MYO PERF W/EXERCISE STRESS 1D 11/22/2015 ?Narrative ?? Nuclear stress EF: 59%. Normal wall motion ?? There was no ST segment deviation noted during stress. ?? This is a low risk study. No perfusion defects, no ischemia ? ? ?HPI: ? ? 81 y.o. with CAD and now post CABG/AVR  03/17/20 Cardiac catheterization demonstrated severe disease in the LAD and RCA.  There was also severe disease in a very small LCx.  She was referred to Dr. Roxy Manns for CABG+AVR. She was admitted 4/13-4/18 and underwent CABG (L-LAD, S-PDA) + bioprosthetic AVR.  Circumflex / OM  too small to graft The post op course was fairly uneventful.  She returns for f/u.  She has felt poorly She stopped her repatha and lasix on her own  ? ?Post op TTE 06/15/20 with EF 60-65% moderate MR normal AVR with no AR and mean gradient 4 peak 6.7 mmHg DVI 0.56.   ? ?Follows with  Dr Donzetta Matters [facility]  Plan  to observe LLE moderate PVD for now   Right ABI 0.65 and left 0.80 09/09/20  ? ?When I saw her 10/05/20 she felt miserable. Had epiglottis Also COVID. 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   ? ?Hospitalized 02/28/21 with Influenza A weakness fatigue, diplopia ? Side effect from [place] CT negative she had mild elevation in BNP/Troponin not thought to be significant and azotemia d/c Cr 1.5 Had non  healing ulcers with angio not showing intervenable dx and had lef AKA by Dr Unk Lightning and d/c to rehab  ?Should be going to [facility] to get prosthetic device in next 2 weeks  ? ?BP/HR have been elevated  ? ?   ?Past Medical History:  ?Diagnosis Date  ? Allergy   ? Anemia   ? childhood  ? Anxiety   ? Aortic stenosis   ? Arterial stenosis ([place])   ? mesenteric  ? Arthritis   ? Breast cancer ([place]) 12/05/10  ? R breast, inv mammary, in situ,ER/PR +,HER2 -  ? Cancer [facility])   ? Carcinoma of breast treated with adjuvant chemotherapy ([place])   ? 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[place])   ? Claudication [facility])   ? Coronary artery disease   ? stent 2007  ? Depression   ? Diabetes mellitus   ? Difficulty sleeping   ? Diverticulosis 12/10/03  ? Elevated cholesterol   ? Generalized weakness   ? GERD (gastroesophageal reflux disease)   ? Heart murmur   ? Hepatitis   ? as an infant  ? HSV-1 (herpes simplex virus 1) infection   ? Hyperlipidemia   ? Hypertension   ? Hypothyroidism   ? Osteoarthritis   ? PAD (peripheral artery disease) ([place])   ? Personal history of chemotherapy   ? Personal history of radiation therapy   ? Pneumonia   ? 2014 september and March 2022  ? Rheumatoid arthritis ([place])   ? S/P aortic valve replacement with bioprosthetic valve 05/04/2020  ? Edwards Inspiris Resilia stented bovine pericardial tissue valve, size 23 mm  ? 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  ? LIMA to D3, SVG to PDA, EVH via right thigh  ? 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  ? Dr [name]  ? Spinal stenosis   ? Thyroid disease   ? ? ?Current Medications: ?Current Meds  ?Medication Sig  ? Accu-Chek FastClix Lancets MISC USE TO CHECK BLOOD SUGARS 3 TIMES DAILY  ? acetaminophen (TYLENOL) 500 MG tablet Take 500 mg by mouth every 6 (six) hours as needed for mild pain or headache.  ? ascorbic acid (VITAMIN C) 250 MG tablet Take 1 tablet (250 mg total) by mouth 2 (two) times daily.  ? aspirin EC 81 MG tablet Take 81 mg by mouth in the morning.  ? blood glucose  meter kit and supplies Dispense based on patient and insurance preference. Use up to four times daily as directed. (FOR ICD-10 E10.9, E11.9).  ? Cholecalciferol (VITAMIN D3) 25 MCG (1000 UT) CAPS Take 2,000 Units by mouth daily before lunch.  ? docusate sodium (COLACE) 100 MG capsule Take 1 capsule (100 mg total) by mouth 2 (two) times daily.  ? Evolocumab (REPATHA SURECLICK) 295 MG/ML SOAJ Inject 1 pen into the skin every 14 (fourteen) days. (Patient taking differently: Inject 140 mg into the skin every 14 (fourteen) days.)  ? glipiZIDE (  GLUCOTROL XL) 10 MG 24 hr tablet Take 1 tablet (10 mg total) by mouth daily with breakfast.  ? glucose blood (ACCU-CHEK GUIDE) test strip TEST ONCE DAILY  ? glucose blood test strip 1 each by Other route as needed for other. Use as instructed ? ?Please dispense Accu check Guide Test strips  ? Golimumab (SIMPONI ARIA IV) Inject into the vein every 2 (two) months.  ? icosapent Ethyl (VASCEPA) 1 g capsule Take 2 capsules (2 g total) by mouth 2 (two) times daily.  ? insulin glargine (LANTUS) 100 UNIT/ML Solostar Pen Inject 15 Units into the skin 2 (two) times daily.  ? Insulin Pen Needle (CAREFINE PEN NEEDLES) 32G X 6 MM MISC 1 application by Does not apply route 2 (two) times daily.  ? levothyroxine (SYNTHROID) 100 MCG tablet Take 125 mcg by mouth daily before breakfast.  ? metoprolol tartrate (LOPRESSOR) 50 MG tablet Take 1 tablet (50 mg total) by mouth 2 (two) times daily.  ? Multiple Vitamin (MULTIVITAMIN WITH MINERALS) TABS tablet Take 1 tablet by mouth daily.  ? nortriptyline (PAMELOR) 10 MG capsule Take 1 capsule (10 mg total) by mouth at bedtime.  ? pantoprazole (PROTONIX) 40 MG tablet Take 1 tablet (40 mg total) by mouth daily before breakfast.  ? PARoxetine (PAXIL) 20 MG tablet Take 1 tablet (20 mg total) by mouth in the morning.  ? Semaglutide,0.25 or 0.5MG/DOS, 2 MG/1.5ML SOPN Inject 0.5 mg into the skin once a week.  ? traMADol (ULTRAM) 50 MG tablet Take 1 tablet (50 mg  total) by mouth every 12 (twelve) hours.  ? traZODone (DESYREL) 50 MG tablet Take 0.5 tablets (25 mg total) by mouth at bedtime as needed for sleep.  ? valACYclovir (VALTREX) 1000 MG tablet Take 1 tablet (1,00

## 2021-05-16 ENCOUNTER — Other Ambulatory Visit: Payer: Self-pay | Admitting: Family Medicine

## 2021-05-16 MED ORDER — INSULIN GLARGINE 100 UNIT/ML SOLOSTAR PEN: 15.0000 [IU] | PEN_INJECTOR | Freq: Two times a day (BID) | SUBCUTANEOUS | 3 refills | Status: DC

## 2021-05-16 NOTE — Telephone Encounter (Signed)
[name] is requesting her Lantus to be refilled. Pharmacy [facility] on [address]. ? ?

## 2021-05-16 NOTE — Telephone Encounter (Signed)
Please review and advise. Thanks. Dm/cma  

## 2021-05-17 ENCOUNTER — Telehealth: Payer: Self-pay

## 2021-05-17 NOTE — Telephone Encounter (Signed)
Received a PA for the insulin Solostar pen.  Her insurance prefers one of the following: ?Humalog Kwik pen ?Lantus SoloStar pen ?Trojea Solostar  ?Victoza ? ?Please review and advise.  ?Thnaks Dm/cma ? ? ? ? ? ?Insurance information: ?BIN# [id number] ?PCN# [id number] ?Group # [id number] ?ID# [id number] ? ?

## 2021-05-17 NOTE — Telephone Encounter (Signed)
Patient notified VIA phone. Dm/cma  

## 2021-05-22 ENCOUNTER — Telehealth: Payer: Self-pay | Admitting: Cardiovascular Disease

## 2021-05-22 NOTE — Telephone Encounter (Signed)
Called patient back. Patient wanted to see if she could do a virtual visit or just reschedule for her appointment on Wednesday. Patient at this point is having a hard time getting around, but it getting her  prosthetic fitted in 2 weeks. Will see what Dr. Johnsie Cancel advises. ?

## 2021-05-22 NOTE — Telephone Encounter (Signed)
Per Dr. Johnsie Cancel, okay to make visit virtual. Patient is aware of change. ?

## 2021-05-22 NOTE — Telephone Encounter (Signed)
?  Pt would like to speak with Pam, she wanted to know if its necessary for her to come in to her appt on 05/03 and if she can just do it over the phone  ?

## 2021-05-24 ENCOUNTER — Telehealth (INDEPENDENT_AMBULATORY_CARE_PROVIDER_SITE_OTHER): Payer: Medicare Other | Admitting: Cardiovascular Disease

## 2021-05-24 VITALS — BP 174/96 | HR 105 | Ht 63.0 in | Wt 131.0 lb

## 2021-05-24 DIAGNOSIS — I779 Disorder of arteries and arterioles, unspecified: Secondary | ICD-10-CM | POA: Diagnosis not present

## 2021-05-24 DIAGNOSIS — E785 Hyperlipidemia, unspecified: Secondary | ICD-10-CM

## 2021-05-24 DIAGNOSIS — Z953 Presence of xenogenic heart valve: Secondary | ICD-10-CM | POA: Diagnosis not present

## 2021-05-24 DIAGNOSIS — I2510 Atherosclerotic heart disease of native coronary artery without angina pectoris: Secondary | ICD-10-CM | POA: Diagnosis not present

## 2021-05-24 DIAGNOSIS — I739 Peripheral vascular disease, unspecified: Secondary | ICD-10-CM

## 2021-05-24 MED ORDER — AMLODIPINE BESYLATE 10 MG PO TABS: 10.0000 mg | ORAL_TABLET | Freq: Every day | ORAL | 3 refills | Status: DC

## 2021-05-24 NOTE — Patient Instructions (Addendum)
Medication Instructions:  ?Your physician has recommended you make the following change in your medication:  ?1-START Amlodipine 10 mg by mouth daily. ? ?*If you need a refill on your cardiac medications before your next appointment, please call your pharmacy* ? ?Lab Work: ?If you have labs (blood work) drawn today and your tests are completely normal, you will receive your results only by: ?MyChart Message (if you have MyChart) OR ?A paper copy in the mail ?If you have any lab test that is abnormal or we need to change your treatment, we will call you to review the results. ? ?Testing/Procedures: ?None ordered today. ? ?Follow-Up: ?At [facility], you and your health needs are our priority.  As part of our continuing mission to provide you with exceptional heart care, we have created designated Provider Care Teams.  These Care Teams include your primary Cardiologist (physician) and Advanced Practice Providers (APPs -  Physician Assistants and Nurse Practitioners) who all work together to provide you with the care you need, when you need it. ? ?We recommend signing up for the patient portal called "MyChart".  Sign up information is provided on this After Visit Summary.  MyChart is used to connect with patients for Virtual Visits (Telemedicine).  Patients are able to view lab/test results, encounter notes, upcoming appointments, etc.  Non-urgent messages can be sent to your provider as well.   ?To learn more about what you can do with MyChart, go to [email or web address].   ? ?Your next appointment:   ?3 month(s) ? ?The format for your next appointment:   ?In Person ? ?Provider:   ?[name], MD { ? ?Important Information About Sugar ? ? ? ? ?  ?

## 2021-05-29 ENCOUNTER — Telehealth: Payer: Self-pay | Admitting: Physical Medicine & Rehabilitation

## 2021-05-29 DIAGNOSIS — S78112A Complete traumatic amputation at level between left hip and knee, initial encounter: Secondary | ICD-10-CM

## 2021-05-29 NOTE — Telephone Encounter (Signed)
Patient already fitted for prosthesis and she goes back to them on Friday and they suggested her to call us to get therapy set up for her. ?

## 2021-05-30 NOTE — Progress Notes (Signed)
[facility] [place] ?HISTORY AND PHYSICAL  ? ?History of Present Illness:  Patient is a 81 y.o. year old female who presents for evaluation of PAD.   She has history of right Right SFA angioplasty with stent placement (predilated with a 4 mm Mustang, stented with 6 mm x 120 mm drug-coated Eluvia and 6 mm x 80 mm drug-coated Eluvia postdilated with a 5 mm Mustang) 04/29/19 by Dr. Carlis Abbott. For Lifestyle limiting claudication.    ? She states about 6 weeks ago she did a foot moisturizer treatment and is cased burning and erythema to her foot above her ankle.  She continue to have pain and burning.  She denies non healing wounds.   ? ? Prior she had left LE life limiting claudication with a non healing wound.  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 She is established with [facility] for prothesis.   ? She is medically managed on a daily ASA.  No statin use due to allergy. ? ?Past Medical History:  ?Diagnosis Date  ? Allergy   ? Anemia   ? childhood  ? Anxiety   ? Aortic stenosis   ? Arterial stenosis ([place])   ? mesenteric  ? Arthritis   ? Breast cancer ([place]) 12/05/10  ? R breast, inv mammary, in situ,ER/PR +,HER2 -  ? Cancer [facility])   ? Carcinoma of breast treated with adjuvant chemotherapy ([place])   ? 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[place])   ? Claudication [facility])   ? Coronary artery disease   ? stent 2007  ? Depression   ? Diabetes mellitus   ? Difficulty sleeping   ? Diverticulosis 12/10/03  ? Elevated cholesterol   ? Generalized weakness   ? GERD (gastroesophageal reflux disease)   ? Heart murmur   ? Hepatitis   ? as an infant  ? HSV-1 (herpes simplex virus 1) infection   ? Hyperlipidemia   ? Hypertension   ? Hypothyroidism   ? Osteoarthritis   ? PAD (peripheral artery disease) ([place])   ? Personal history of chemotherapy   ? Personal history of radiation therapy   ? Pneumonia   ? 2014 september and March 2022  ? Rheumatoid arthritis ([place])   ? S/P aortic valve  replacement with bioprosthetic valve 05/04/2020  ? Edwards Inspiris Resilia stented bovine pericardial tissue valve, size 23 mm  ? 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  ? LIMA to D3, SVG to PDA, EVH via right thigh  ? 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  ? Dr [name]  ? Spinal stenosis   ? Thyroid disease   ? ? ?Past Surgical History:  ?Procedure Laterality Date  ? ABDOMINAL AORTOGRAM W/LOWER EXTREMITY Bilateral 04/29/2019  ? Procedure: ABDOMINAL AORTOGRAM W/LOWER EXTREMITY;  Surgeon: Marty Heck, MD;  Location: [place] [facility];  Service: Cardiovascular;  Laterality: Bilateral;  ? ABDOMINAL AORTOGRAM W/LOWER EXTREMITY Left 07/01/2019  ? Procedure: ABDOMINAL AORTOGRAM W/ Left LOWER EXTREMITY [place];  Surgeon: Marty Heck, MD;  Location: [place] [facility];  Service: Cardiovascular;  Laterality: Left;  ? ABDOMINAL AORTOGRAM W/LOWER EXTREMITY Left 03/03/2021  ? Procedure: ABDOMINAL AORTOGRAM W/LOWER EXTREMITY;  Surgeon: Cherre Robins, MD;  Location: [place] [facility];  Service: Cardiovascular;  Laterality: Left;  ? AMPUTATION Left 03/06/2021  ? Procedure: LEFT ABOVE KNEE AMPUTATION;  Surgeon: [name], MD;  Location: [place];  Service: Vascular;  Laterality: Left;  ? ANTERIOR AND POSTERIOR  REPAIR N/A 04/05/2016  ? Procedure: ANTERIOR (CYSTOCELE) AND POSTERIOR REPAIR (RECTOCELE);  Surgeon: [name], MD;  Location: [place] [facility];  Service: Gynecology;  Laterality: N/A;  ? AORTIC VALVE REPLACEMENT N/A 05/04/2020  ? Procedure: AORTIC VALVE REPLACEMENT (AVR) USING INSPIRIS 23MM RESILIA AORTIC VALVE;  Surgeon: Rexene Alberts, MD;  Location: [place];  Service: Open Heart Surgery;  Laterality: N/A;  ? BREAST BIOPSY    ? BREAST LUMPECTOMY  1983  ? benign biopsy  ? BREAST LUMPECTOMY Right 12/29/2010  ? snbx, ER/PR +, Her2 -, 0/1 node pos.  ? CARPAL TUNNEL RELEASE Bilateral 9/12,8,12  ? CHOLECYSTECTOMY    ? COLONOSCOPY N/A 02/09/2013  ? Procedure: COLONOSCOPY;  Surgeon: [name], MD;  Location: [facility];   Service: Endoscopy;  Laterality: N/A;  ? COLONOSCOPY    ? CORONARY ANGIOPLASTY    ? CORONARY ARTERY BYPASS GRAFT N/A 05/04/2020  ? Procedure: CORONARY ARTERY BYPASS GRAFTING (CABG), ON PUMP, TIMES TWO, USING LEFT INTERNAL MAMMARY ARTERY AND RIGHT ENDOSCOPICALLY HARVESTED GREATER SAPHENOUS VEIN;  Surgeon: Rexene Alberts, MD;  Location: [place];  Service: Open Heart Surgery;  Laterality: N/A;  ? DILATION AND CURETTAGE OF UTERUS    ? ESOPHAGOGASTRODUODENOSCOPY N/A 02/09/2013  ? Procedure: ESOPHAGOGASTRODUODENOSCOPY (EGD);  Surgeon: [name], MD;  Location: [name];  Service: Endoscopy;  Laterality: N/A;  ? FOOT SPURS    ? HYSTEROSCOPY WITH D & C N/A 09/11/2012  ? Procedure: DILATATION AND CURETTAGE ;  Surgeon: [name], MD;  Location: [place] [facility];  Service: Gynecology;  Laterality: N/A;  ? KNEE SURGERY  2007  ? LAPAROSCOPIC ASSISTED VAGINAL HYSTERECTOMY N/A 04/05/2016  ? Procedure: LAPAROSCOPIC ASSISTED VAGINAL HYSTERECTOMY possible BSO;  Surgeon: [name], MD;  Location: [place] [facility];  Service: Gynecology;  Laterality: N/A;  ? LUMBAR LAMINECTOMY/DECOMPRESSION MICRODISCECTOMY N/A 10/20/2014  ? Procedure: MICRO LUMBER DECOMPRESSION L3-4 L4-5;  Surgeon: [name], MD;  Location: [facility];  Service: Orthopedics;  Laterality: N/A;  ? LUMBAR LAMINECTOMY/DECOMPRESSION MICRODISCECTOMY Bilateral 10/13/2015  ? Procedure: MICRO LUMBAR DECOMPRESSION L5 - S1 AND REDO DECOMPRESSION L4 - L5 AND REMOVAL OF FACET CYST L4 - L5 2 LEVELS;  Surgeon: [name], MD;  Location: [facility];  Service: Orthopedics;  Laterality: Bilateral;  ? PERIPHERAL VASCULAR INTERVENTION Right 04/29/2019  ? Procedure: PERIPHERAL VASCULAR INTERVENTION;  Surgeon: Marty Heck, MD;  Location: [place] [facility];  Service: Cardiovascular;  Laterality: Right;  ? PORT-A-CATH REMOVAL  04/17/2011  ? Procedure: REMOVAL PORT-A-CATH;  Surgeon: [name], MD;  Location: [place];  Service: General;  Laterality: Left;  ? PORTACATH  PLACEMENT  02/07/2011  ? Procedure: INSERTION PORT-A-CATH;  Surgeon: [name], MD;  [facility]: [facility];  Service: General;  Laterality: N/A;  ? RIGHT/LEFT HEART CATH AND CORONARY ANGIOGRAPHY N/A 03/17/2020  ? Procedure: RIGHT/LEFT HEART CATH AND CORONARY ANGIOGRAPHY;  Surgeon: Burnell Blanks, MD;  Location: [place] [facility];  Service: Cardiovascular;  Laterality: N/A;  ? TEE WITHOUT CARDIOVERSION N/A 05/04/2020  ? Procedure: TRANSESOPHAGEAL ECHOCARDIOGRAM (TEE);  Surgeon: Rexene Alberts, MD;  Location: [place];  Service: Open Heart Surgery;  Laterality: N/A;  ? UPPER GASTROINTESTINAL ENDOSCOPY    ? ? ?ROS:  ? ?General:  No weight loss, Fever, chills ? ?HEENT: No recent headaches, no nasal bleeding, no visual changes, no sore throat ? ?Neurologic: No dizziness, blackouts, seizures. No recent symptoms of stroke or mini- stroke. No recent episodes of slurred speech, or temporary blindness. ? ?Cardiac: No recent episodes of  chest pain/pressure, no shortness of breath at rest.  No shortness of breath with exertion.  Denies history of atrial fibrillation or irregular heartbeat ? ?Vascular: No history of rest pain in feet.  No history of claudication.  No history of non-healing ulcer, No history of DVT  ? ?Pulmonary: No home oxygen, no productive cough, no hemoptysis,  No asthma or wheezing ? ?Musculoskeletal:  '[ ]'  Arthritis, '[ ]'  Low back pain,  '[ ]'  Joint pain ? ?Hematologic:No history of hypercoagulable state.  No history of easy bleeding.  No history of anemia ? ?Gastrointestinal: No hematochezia or melena,  No gastroesophageal reflux, no trouble swallowing ? ?Urinary: '[ ]'  chronic Kidney disease, '[ ]'  on HD - '[ ]'  MWF or '[ ]'  TTHS, '[ ]'  Burning with urination, '[ ]'  Frequent urination, '[ ]'  Difficulty urinating;  ? ?Skin: No rashes ? ?Psychological: No history of anxiety,  No history of depression ? ?Social History ?Social History  ? ?Tobacco Use  ? Smoking status: Former  ?  Packs/day: 2.00  ?  Years: 40.00  ?   Pack years: 80.00  ?  Types: Cigarettes  ?  Quit date: 12/19/1997  ?  Years since quitting: 23.4  ? Smokeless tobacco: Never  ?Vaping Use  ? Vaping Use: Never used  ?Substance Use Topics  ? Alcohol use: Yes  ?  Al

## 2021-05-30 NOTE — H&P (View-Only) (Signed)
[facility] HISTORY AND PHYSICAL   History of Present Illness:  Patient is a 81 y.o. year old female who presents for evaluation of PAD.   She has history of right Right SFA angioplasty with stent placement (predilated with a 4 mm Mustang, stented with 6 mm x 120 mm drug-coated Eluvia and 6 mm x 80 mm drug-coated Eluvia postdilated with a 5 mm Mustang) 04/29/19 by Dr. Carlis Abbott. For Lifestyle limiting claudication.     She states about 6 weeks ago she did a foot moisturizer treatment and is cased burning and erythema to her foot above her ankle.  She continue to have pain and burning.  She denies non healing wounds.     Prior she had left LE life limiting claudication with a non healing wound.  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 She is established with [facility] for prothesis.    She is medically managed on a daily ASA.  No statin use due to allergy.  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[place])    Claudication ([place])    Coronary artery disease    stent 2007   Depression    Diabetes mellitus    Difficulty sleeping    Diverticulosis 12/10/03   Elevated cholesterol    Generalized weakness    GERD (gastroesophageal reflux disease)    Heart murmur    Hepatitis    as an infant   HSV-1 (herpes simplex virus 1) infection    Hyperlipidemia    Hypertension    Hypothyroidism    Osteoarthritis    PAD (peripheral artery disease) ([place])    Personal history of chemotherapy    Personal history of radiation therapy    Pneumonia    2014 september and March 2022   Rheumatoid arthritis ([place])    S/P aortic valve  replacement with bioprosthetic valve 05/04/2020   Edwards Inspiris Resilia stented bovine pericardial tissue valve, size 23 mm   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   LIMA to D3, SVG to PDA, EVH via right thigh   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   Dr [name]   Spinal stenosis    Thyroid disease     Past Surgical History:  Procedure Laterality Date   ABDOMINAL AORTOGRAM W/LOWER EXTREMITY Bilateral 04/29/2019   Procedure: ABDOMINAL AORTOGRAM W/LOWER EXTREMITY;  Surgeon: Marty Heck, MD;  Location: [facility];  Service: Cardiovascular;  Laterality: Bilateral;   ABDOMINAL AORTOGRAM W/LOWER EXTREMITY Left 07/01/2019   Procedure: ABDOMINAL AORTOGRAM W/ Left LOWER EXTREMITY [place];  Surgeon: Marty Heck, MD;  Location: [facility];  Service: Cardiovascular;  Laterality: Left;   ABDOMINAL AORTOGRAM W/LOWER EXTREMITY Left 03/03/2021   Procedure: ABDOMINAL AORTOGRAM W/LOWER EXTREMITY;  Surgeon: Cherre Robins, MD;  Location: [facility];  Service: Cardiovascular;  Laterality: Left;   AMPUTATION Left 03/06/2021   Procedure: LEFT ABOVE KNEE AMPUTATION;  Surgeon: [name], MD;  Location: [facility];  Service: Vascular;  Laterality: Left;   ANTERIOR AND POSTERIOR  REPAIR N/A 04/05/2016   Procedure: ANTERIOR (CYSTOCELE) AND POSTERIOR REPAIR (RECTOCELE);  Surgeon: [name], MD;  Location: [facility];  Service: Gynecology;  Laterality: N/A;   AORTIC VALVE REPLACEMENT N/A 05/04/2020   Procedure: AORTIC VALVE REPLACEMENT (AVR) USING INSPIRIS 23MM RESILIA AORTIC VALVE;  Surgeon: Rexene Alberts, MD;  Location: [facility];  Service: Open Heart Surgery;  Laterality: N/A;   BREAST BIOPSY     BREAST LUMPECTOMY  1983   benign biopsy   BREAST LUMPECTOMY Right 12/29/2010   snbx, ER/PR +, Her2 -, 0/1 node pos.   CARPAL TUNNEL RELEASE Bilateral 9/12,8,12   CHOLECYSTECTOMY     COLONOSCOPY N/A 02/09/2013   Procedure: COLONOSCOPY;  Surgeon: [name], MD;  Location: [facility];   Service: Endoscopy;  Laterality: N/A;   COLONOSCOPY     CORONARY ANGIOPLASTY     CORONARY ARTERY BYPASS GRAFT N/A 05/04/2020   Procedure: CORONARY ARTERY BYPASS GRAFTING (CABG), ON PUMP, TIMES TWO, USING LEFT INTERNAL MAMMARY ARTERY AND RIGHT ENDOSCOPICALLY HARVESTED GREATER SAPHENOUS VEIN;  Surgeon: Rexene Alberts, MD;  Location: [facility];  Service: Open Heart Surgery;  Laterality: N/A;   DILATION AND CURETTAGE OF UTERUS     ESOPHAGOGASTRODUODENOSCOPY N/A 02/09/2013   Procedure: ESOPHAGOGASTRODUODENOSCOPY (EGD);  Surgeon: [name], MD;  Location: [name];  Service: Endoscopy;  Laterality: N/A;   FOOT SPURS     HYSTEROSCOPY WITH D & C N/A 09/11/2012   Procedure: DILATATION AND CURETTAGE ;  Surgeon: [name], MD;  Location: [facility];  Service: Gynecology;  Laterality: N/A;   KNEE SURGERY  2007   LAPAROSCOPIC ASSISTED VAGINAL HYSTERECTOMY N/A 04/05/2016   Procedure: LAPAROSCOPIC ASSISTED VAGINAL HYSTERECTOMY possible BSO;  Surgeon: [name], MD;  Location: [facility];  Service: Gynecology;  Laterality: N/A;   LUMBAR LAMINECTOMY/DECOMPRESSION MICRODISCECTOMY N/A 10/20/2014   Procedure: MICRO LUMBER DECOMPRESSION L3-4 L4-5;  Surgeon: [name], MD;  Location: [facility];  Service: Orthopedics;  Laterality: N/A;   LUMBAR LAMINECTOMY/DECOMPRESSION MICRODISCECTOMY Bilateral 10/13/2015   Procedure: MICRO LUMBAR DECOMPRESSION L5 - S1 AND REDO DECOMPRESSION L4 - L5 AND REMOVAL OF FACET CYST L4 - L5 2 LEVELS;  Surgeon: [name], MD;  Location: [facility];  Service: Orthopedics;  Laterality: Bilateral;   PERIPHERAL VASCULAR INTERVENTION Right 04/29/2019   Procedure: PERIPHERAL VASCULAR INTERVENTION;  Surgeon: Marty Heck, MD;  Location: [facility];  Service: Cardiovascular;  Laterality: Right;   PORT-A-CATH REMOVAL  04/17/2011   Procedure: REMOVAL PORT-A-CATH;  Surgeon: [name], MD;  Location: [facility];  Service: General;  Laterality: Left;   PORTACATH  PLACEMENT  02/07/2011   Procedure: INSERTION PORT-A-CATH;  Surgeon: [name], MD;  Location: [facility];  Service: General;  Laterality: N/A;   RIGHT/LEFT HEART CATH AND CORONARY ANGIOGRAPHY N/A 03/17/2020   Procedure: RIGHT/LEFT HEART CATH AND CORONARY ANGIOGRAPHY;  Surgeon: Burnell Blanks, MD;  Location: [facility];  Service: Cardiovascular;  Laterality: N/A;   TEE WITHOUT CARDIOVERSION N/A 05/04/2020   Procedure: TRANSESOPHAGEAL ECHOCARDIOGRAM (TEE);  Surgeon: Rexene Alberts, MD;  Location: [facility];  Service: Open Heart Surgery;  Laterality: N/A;   UPPER GASTROINTESTINAL ENDOSCOPY      ROS:   General:  No weight loss, Fever, chills  HEENT: No recent headaches, no nasal bleeding, no visual changes, no sore throat  Neurologic: No dizziness, blackouts, seizures. No recent symptoms of stroke or mini- stroke. No recent episodes of slurred speech, or temporary blindness.  Cardiac: No recent episodes of  chest pain/pressure, no shortness of breath at rest.  No shortness of breath with exertion.  Denies history of atrial fibrillation or irregular heartbeat  Vascular: No history of rest pain in feet.  No history of claudication.  No history of non-healing ulcer, No history of DVT   Pulmonary: No home oxygen, no productive cough, no hemoptysis,  No asthma or wheezing  Musculoskeletal:  '[ ]'  Arthritis, '[ ]'  Low back pain,  '[ ]'  Joint pain  Hematologic:No history of hypercoagulable state.  No history of easy bleeding.  No history of anemia  Gastrointestinal: No hematochezia or melena,  No gastroesophageal reflux, no trouble swallowing  Urinary: '[ ]'  chronic Kidney disease, '[ ]'  on HD - '[ ]'  MWF or '[ ]'  TTHS, '[ ]'  Burning with urination, '[ ]'  Frequent urination, '[ ]'  Difficulty urinating;   Skin: No rashes  Psychological: No history of anxiety,  No history of depression  Social History Social History   Tobacco Use   Smoking status: Former    Packs/day: 2.00    Years: 40.00     Pack years: 80.00    Types: Cigarettes    Quit date: 12/19/1997    Years since quitting: 23.4   Smokeless tobacco: Never  Vaping Use   Vaping Use: Never used  Substance Use Topics   Alcohol use: Yes    Alcohol/week: 1.0 standard drink    Types: 1 Glasses of wine per week    Comment: occasiona - maybe once a month   Drug use: No    Family History Family History  Problem Relation Age of Onset   Kidney disease Mother    Heart disease Mother    Throat cancer Father    Alcoholism Father    Heart attack Father    Lymphoma Brother 24   Heart attack Brother    Diabetes Brother    Hypertension Brother    Cancer Son    Colon cancer Neg Hx    Stroke Neg Hx    Esophageal cancer Neg Hx    Rectal cancer Neg Hx    Stomach cancer Neg Hx     Allergies  Allergies  Allergen Reactions   Codeine Nausea And Vomiting and Other (See Comments)    Severe stomach cramps, also   Antihistamines, Diphenhydramine-Type Other (See Comments)    Causes hyperactivity   Gabapentin     Urinary incontinence   Lyrica [Pregabalin]     Double vision/falls   Oxycodone     Hallucinations/crazy feelings   Statins Other (See Comments)    Joint pains   Sulfa Antibiotics Other (See Comments)    Reaction not recalled   Erythromycin Hives, Rash and Other (See Comments)    Allergic due to dental work from 23 years ago. It was used in packing and resulted in rash/hives inside and outside of mouth.   Trulicity [Dulaglutide] Itching     Current Outpatient Medications  Medication Sig Dispense Refill   Accu-Chek FastClix Lancets MISC USE TO CHECK BLOOD SUGARS 3 TIMES DAILY 306 each 3   acetaminophen (TYLENOL) 500 MG tablet Take 500 mg by mouth every 6 (six) hours as needed for mild pain or headache.     amLODipine (NORVASC) 10 MG tablet Take 1 tablet (10 mg total) by mouth daily. 90 tablet 3   ascorbic acid (VITAMIN C) 250 MG tablet Take 1 tablet (250 mg total) by mouth 2 (two) times daily. 60 tablet 0    aspirin EC 81 MG tablet Take  81 mg by mouth in the morning.     blood glucose meter kit and supplies Dispense based on patient and insurance preference. Use up to four times daily as directed. (FOR ICD-10 E10.9, E11.9). 1 each 0   Blood Glucose Monitoring Suppl DEVI by Does not apply route. Accu Check Guide     Cholecalciferol (VITAMIN D3) 25 MCG (1000 UT) CAPS Take 2,000 Units by mouth daily before lunch.     docusate sodium (COLACE) 100 MG capsule Take 1 capsule (100 mg total) by mouth 2 (two) times daily. 60 capsule 0   Evolocumab (REPATHA SURECLICK) 416 MG/ML SOAJ Inject 1 pen into the skin every 14 (fourteen) days. (Patient taking differently: Inject 140 mg into the skin every 14 (fourteen) days.) 2 mL 11   glipiZIDE (GLUCOTROL XL) 10 MG 24 hr tablet Take 1 tablet (10 mg total) by mouth daily with breakfast. 90 tablet 3   glucose blood (ACCU-CHEK GUIDE) test strip TEST ONCE DAILY 100 each 5   glucose blood test strip 1 each by Other route as needed for other. Use as instructed  Please dispense Accu check Guide Test strips     Golimumab (SIMPONI ARIA IV) Inject into the vein every 2 (two) months.     icosapent Ethyl (VASCEPA) 1 g capsule Take 2 capsules (2 g total) by mouth 2 (two) times daily. 120 capsule 11   insulin glargine (LANTUS) 100 UNIT/ML Solostar Pen Inject 15 Units into the skin 2 (two) times daily. 15 mL 3   Insulin Pen Needle (CAREFINE PEN NEEDLES) 32G X 6 MM MISC 1 application by Does not apply route 2 (two) times daily. 120 each 1   levothyroxine (SYNTHROID) 100 MCG tablet Take 125 mcg by mouth daily before breakfast.     metoprolol tartrate (LOPRESSOR) 50 MG tablet Take 1 tablet (50 mg total) by mouth 2 (two) times daily. 60 tablet 0   Multiple Vitamin (MULTIVITAMIN WITH MINERALS) TABS tablet Take 1 tablet by mouth daily.     nortriptyline (PAMELOR) 10 MG capsule Take 1 capsule (10 mg total) by mouth at bedtime. 30 capsule 1   pantoprazole (PROTONIX) 40 MG tablet Take 1 tablet  (40 mg total) by mouth daily before breakfast.     PARoxetine (PAXIL) 20 MG tablet Take 1 tablet (20 mg total) by mouth in the morning. 90 tablet 3   Semaglutide,0.25 or 0.5MG/DOS, 2 MG/1.5ML SOPN Inject 0.5 mg into the skin once a week.     traMADol (ULTRAM) 50 MG tablet Take 1 tablet (50 mg total) by mouth every 12 (twelve) hours. 60 tablet 0   traZODone (DESYREL) 50 MG tablet Take 0.5 tablets (25 mg total) by mouth at bedtime as needed for sleep. 30 tablet 6   valACYclovir (VALTREX) 1000 MG tablet Take 1 tablet (1,000 mg total) by mouth daily as needed (Fever Blisters). 180 tablet 0   zinc sulfate 220 (50 Zn) MG capsule Take 1 capsule (220 mg total) by mouth daily. 30 capsule 0   No current facility-administered medications for this visit.    Physical Examination  Vitals:   05/31/21 1403  BP: 139/72  Pulse: 74  Resp: 20  Temp: 97.9 F (36.6 C)  TempSrc: Temporal  SpO2: 98%  Height: '5\' 3"'  (1.6 m)    Body mass index is 23.21 kg/m.  General:  Alert and oriented, no acute distress HEENT: Normal Neck: No bruit or JVD Pulmonary: Clear to auscultation bilaterally Cardiac: Regular Rate and Rhythm without murmur Abdomen:  Soft, non-tender, non-distended, no mass, no scars Skin: No rash, erythema left foot without edema.  Extremity Pulses:  2+ radial, brachial, femoral, right  LE non palpable dorsalis pedis, posterior tibial pulses  Musculoskeletal: No deformity or edema Well healed left AKA Neurologic: Upper and lower extremity motor 5/5 and symmetric  DATA:  +----------+--------+-----+--------+----------+--------+  RIGHT     PSV cm/sRatioStenosisWaveform  Comments  +----------+--------+-----+--------+----------+--------+  CFA Distal91                   triphasic           +----------+--------+-----+--------+----------+--------+  DFA       104                  triphasic           +----------+--------+-----+--------+----------+--------+  SFA Prox  71                    triphasic           +----------+--------+-----+--------+----------+--------+  SFA Mid                                  stent     +----------+--------+-----+--------+----------+--------+  SFA Distal                               stent     +----------+--------+-----+--------+----------+--------+  POP Prox  84                   monophasic          +----------+--------+-----+--------+----------+--------+  POP Distal74                   monophasic          +----------+--------+-----+--------+----------+--------+  TP Trunk  136                  monophasic          +----------+--------+-----+--------+----------+--------+  A focal velocity elevation of 136 cm/s was obtained at TPT with post  stenotic turbulence with a VR of 3.5. Findings are characteristic of  50-74% stenosis.     Right Stent(s):  +------------------+--------+---------------+----------+------------------+   prox to distal SFAPSV cm/sStenosis       Waveform  Comments              +------------------+--------+---------------+----------+------------------+   Prox to Stent     52                     biphasic                        +------------------+--------+---------------+----------+------------------+   Proximal Stent    48                     triphasic                       +------------------+--------+---------------+----------+------------------+   Mid Stent         67->334 50-99% stenosis          velocity ratio  4.9  +------------------+--------+---------------+----------+------------------+   Distal Stent      34                     monophasic                      +------------------+--------+---------------+----------+------------------+  Distal to Stent   47                     monophasic                      +------------------+--------+---------------+----------+------------------+       Summary:  Right:  Tibio-peroneal trunk stenosis 50-74% by velocity ratio.  Patent stent with mid stent stenosis 50-99%.   ASSESSMENT/ PLAN:  PAD s/p right SFA stents 04/29/19 The duplex shows significant stenosis with velocity of 334 mid stent.  This combined with right foot pain and burning on exam today I have decided to schedule her for Aortogram with possible intervention on the right LE.   She is scheduled for left AKA prothesis and then PT to learn to ambulate.  I asked her to postpone the PT until after the angiogram.  She agrees with this plan.       Roxy Horseman PA-C [facility] Office: [phone]  MD in clinic [place]

## 2021-05-31 ENCOUNTER — Ambulatory Visit (HOSPITAL_COMMUNITY)
Admission: RE | Admit: 2021-05-31 | Discharge: 2021-05-31 | Disposition: A | Discharge status: Home / Self Care | Payer: Medicare Other | Source: Ambulatory Visit | Attending: Vascular Surgery | Admitting: Vascular Surgery

## 2021-05-31 ENCOUNTER — Ambulatory Visit (INDEPENDENT_AMBULATORY_CARE_PROVIDER_SITE_OTHER)
Admission: RE | Admit: 2021-05-31 | Discharge: 2021-05-31 | Disposition: A | Discharge status: Home / Self Care | Payer: Medicare Other | Source: Ambulatory Visit | Attending: Vascular Surgery | Admitting: Vascular Surgery

## 2021-05-31 ENCOUNTER — Other Ambulatory Visit: Payer: Self-pay

## 2021-05-31 ENCOUNTER — Encounter: Payer: Self-pay | Admitting: Physician Assistant

## 2021-05-31 ENCOUNTER — Ambulatory Visit (INDEPENDENT_AMBULATORY_CARE_PROVIDER_SITE_OTHER): Payer: Medicare Other | Admitting: Physician Assistant

## 2021-05-31 VITALS — BP 139/72 | HR 74 | Temp 97.9°F | Resp 20 | Ht 63.0 in

## 2021-05-31 DIAGNOSIS — I739 Peripheral vascular disease, unspecified: Secondary | ICD-10-CM | POA: Insufficient documentation

## 2021-05-31 NOTE — Telephone Encounter (Signed)
Notified. 

## 2021-06-07 ENCOUNTER — Ambulatory Visit: Payer: Medicare Other | Admitting: Physical Therapy

## 2021-06-12 ENCOUNTER — Ambulatory Visit (HOSPITAL_COMMUNITY)
Admission: RE | Disposition: A | Discharge status: Home / Self Care | Payer: Self-pay | Source: Home / Self Care | Attending: Vascular Surgery

## 2021-06-12 ENCOUNTER — Other Ambulatory Visit: Payer: Self-pay

## 2021-06-12 ENCOUNTER — Ambulatory Visit (HOSPITAL_COMMUNITY)
Admission: RE | Admit: 2021-06-12 | Discharge: 2021-06-12 | Disposition: A | Discharge status: Home / Self Care | Payer: Medicare Other | Attending: Vascular Surgery | Admitting: Vascular Surgery

## 2021-06-12 DIAGNOSIS — Z87891 Personal history of nicotine dependence: Secondary | ICD-10-CM | POA: Insufficient documentation

## 2021-06-12 DIAGNOSIS — Z7985 Long-term (current) use of injectable non-insulin antidiabetic drugs: Secondary | ICD-10-CM | POA: Insufficient documentation

## 2021-06-12 DIAGNOSIS — Z7982 Long term (current) use of aspirin: Secondary | ICD-10-CM | POA: Diagnosis not present

## 2021-06-12 DIAGNOSIS — Y832 Surgical operation with anastomosis, bypass or graft as the cause of abnormal reaction of the patient, or of later complication, without mention of misadventure at the time of the procedure: Secondary | ICD-10-CM | POA: Insufficient documentation

## 2021-06-12 DIAGNOSIS — Z89612 Acquired absence of left leg above knee: Secondary | ICD-10-CM | POA: Insufficient documentation

## 2021-06-12 DIAGNOSIS — I739 Peripheral vascular disease, unspecified: Secondary | ICD-10-CM

## 2021-06-12 DIAGNOSIS — Z7984 Long term (current) use of oral hypoglycemic drugs: Secondary | ICD-10-CM | POA: Diagnosis not present

## 2021-06-12 DIAGNOSIS — I70221 Atherosclerosis of native arteries of extremities with rest pain, right leg: Secondary | ICD-10-CM | POA: Insufficient documentation

## 2021-06-12 DIAGNOSIS — Z794 Long term (current) use of insulin: Secondary | ICD-10-CM | POA: Insufficient documentation

## 2021-06-12 DIAGNOSIS — E1151 Type 2 diabetes mellitus with diabetic peripheral angiopathy without gangrene: Secondary | ICD-10-CM | POA: Insufficient documentation

## 2021-06-12 DIAGNOSIS — T82856A Stenosis of peripheral vascular stent, initial encounter: Secondary | ICD-10-CM | POA: Insufficient documentation

## 2021-06-12 HISTORY — PX: PERIPHERAL VASCULAR ATHERECTOMY: CATH118256

## 2021-06-12 HISTORY — PX: PERIPHERAL VASCULAR INTERVENTION: CATH118257

## 2021-06-12 HISTORY — PX: ABDOMINAL AORTOGRAM W/LOWER EXTREMITY: CATH118223

## 2021-06-12 LAB — POCT I-STAT, CHEM 8
BUN: 31 mg/dL — ABNORMAL HIGH (ref 8–23)
Calcium, Ion: 1.29 mmol/L (ref 1.15–1.40)
Chloride: 103 mmol/L (ref 98–111)
Creatinine, Ser: 1.7 mg/dL — ABNORMAL HIGH (ref 0.44–1.00)
Glucose, Bld: 168 mg/dL — ABNORMAL HIGH (ref 70–99)
HCT: 38 % (ref 36.0–46.0)
Hemoglobin: 12.9 g/dL (ref 12.0–15.0)
Potassium: 4.3 mmol/L (ref 3.5–5.1)
Sodium: 139 mmol/L (ref 135–145)
TCO2: 27 mmol/L (ref 22–32)

## 2021-06-12 LAB — GLUCOSE, CAPILLARY: Glucose-Capillary: 185 mg/dL — ABNORMAL HIGH (ref 70–99)

## 2021-06-12 SURGERY — ABDOMINAL AORTOGRAM W/LOWER EXTREMITY
Anesthesia: LOCAL | Laterality: Right

## 2021-06-12 MED ORDER — ACETAMINOPHEN 325 MG PO TABS: 650.0000 mg | ORAL_TABLET | ORAL | Status: DC | PRN

## 2021-06-12 MED ORDER — LABETALOL HCL 5 MG/ML IV SOLN: 10.0000 mg | INTRAVENOUS | Status: DC | PRN

## 2021-06-12 MED ORDER — CLOPIDOGREL BISULFATE 75 MG PO TABS: 75.0000 mg | ORAL_TABLET | Freq: Every day | ORAL | Status: DC

## 2021-06-12 MED ORDER — SODIUM CHLORIDE 0.9% FLUSH: 3.0000 mL | Freq: Two times a day (BID) | INTRAVENOUS | Status: DC

## 2021-06-12 MED ORDER — HEPARIN (PORCINE) IN NACL 1000-0.9 UT/500ML-% IV SOLN
INTRAVENOUS | Status: AC
  Filled 2021-06-12: qty 1000

## 2021-06-12 MED ORDER — FENTANYL CITRATE (PF) 100 MCG/2ML IJ SOLN
INTRAMUSCULAR | Status: DC | PRN
  Administered 2021-06-12 (×2): 25 ug via INTRAVENOUS

## 2021-06-12 MED ORDER — MIDAZOLAM HCL 2 MG/2ML IJ SOLN
INTRAMUSCULAR | Status: AC
  Filled 2021-06-12: qty 2

## 2021-06-12 MED ORDER — CLOPIDOGREL BISULFATE 300 MG PO TABS
ORAL_TABLET | ORAL | Status: DC | PRN
  Administered 2021-06-12: 300 mg via ORAL

## 2021-06-12 MED ORDER — SODIUM CHLORIDE 0.9 % WEIGHT BASED INFUSION: 1.0000 mL/kg/h | INTRAVENOUS | Status: DC

## 2021-06-12 MED ORDER — SODIUM CHLORIDE 0.9 % IV SOLN: 250.0000 mL | INTRAVENOUS | Status: DC | PRN

## 2021-06-12 MED ORDER — FENTANYL CITRATE (PF) 100 MCG/2ML IJ SOLN
INTRAMUSCULAR | Status: AC
  Filled 2021-06-12: qty 2

## 2021-06-12 MED ORDER — MIDAZOLAM HCL 2 MG/2ML IJ SOLN
INTRAMUSCULAR | Status: DC | PRN
  Administered 2021-06-12 (×2): 1 mg via INTRAVENOUS

## 2021-06-12 MED ORDER — HEPARIN SODIUM (PORCINE) 1000 UNIT/ML IJ SOLN
INTRAMUSCULAR | Status: DC | PRN
  Administered 2021-06-12 (×2): 5000 [IU] via INTRAVENOUS

## 2021-06-12 MED ORDER — LIDOCAINE HCL (PF) 1 % IJ SOLN
INTRAMUSCULAR | Status: AC
  Filled 2021-06-12: qty 30

## 2021-06-12 MED ORDER — LIDOCAINE HCL (PF) 1 % IJ SOLN
INTRAMUSCULAR | Status: DC | PRN
  Administered 2021-06-12: 12 mL
  Administered 2021-06-12: 2 mL

## 2021-06-12 MED ORDER — CLOPIDOGREL BISULFATE 75 MG PO TABS: 300.0000 mg | ORAL_TABLET | Freq: Once | ORAL | Status: DC

## 2021-06-12 MED ORDER — SODIUM CHLORIDE 0.9% FLUSH: 3.0000 mL | INTRAVENOUS | Status: DC | PRN

## 2021-06-12 MED ORDER — CLOPIDOGREL BISULFATE 300 MG PO TABS
ORAL_TABLET | ORAL | Status: AC
Start: 2021-06-12 — End: ?
  Filled 2021-06-12: qty 1

## 2021-06-12 MED ORDER — ONDANSETRON HCL 4 MG/2ML IJ SOLN: 4.0000 mg | Freq: Four times a day (QID) | INTRAMUSCULAR | Status: DC | PRN

## 2021-06-12 MED ORDER — CLOPIDOGREL BISULFATE 75 MG PO TABS: 75.0000 mg | ORAL_TABLET | Freq: Every day | ORAL | 11 refills | Status: DC

## 2021-06-12 MED ORDER — HEPARIN SODIUM (PORCINE) 1000 UNIT/ML IJ SOLN
INTRAMUSCULAR | Status: AC
  Filled 2021-06-12: qty 10

## 2021-06-12 MED ORDER — SODIUM CHLORIDE 0.9 % IV SOLN: INTRAVENOUS | Status: DC

## 2021-06-12 MED ORDER — IODIXANOL 320 MG/ML IV SOLN
INTRAVENOUS | Status: DC | PRN
  Administered 2021-06-12: 80 mL

## 2021-06-12 MED ORDER — HEPARIN (PORCINE) IN NACL 1000-0.9 UT/500ML-% IV SOLN
INTRAVENOUS | Status: DC | PRN
Start: 2021-06-12 — End: 2021-06-12
  Administered 2021-06-12 (×2): 500 mL

## 2021-06-12 MED ORDER — HYDRALAZINE HCL 20 MG/ML IJ SOLN: 5.0000 mg | INTRAMUSCULAR | Status: DC | PRN

## 2021-06-12 SURGICAL SUPPLY — 36 items
BALLN COYOTE OTW 2.5X220X150 (BALLOONS) ×2
BALLN STERLING OTW 5X220X150 (BALLOONS) ×2
BALLOON COYOTE OTW 2.5X220X150 (BALLOONS) IMPLANT
BALLOON STERLING OTW 5X220X150 (BALLOONS) IMPLANT
CATH AURYON ATHERECTOMY 1.5 (CATHETERS) ×1 IMPLANT
CATH CXI 2.6F 65 ANG (CATHETERS) ×2
CATH OMNI FLUSH 5F 65CM (CATHETERS) ×1 IMPLANT
CATH QUICKCROSS .035X135CM (MICROCATHETER) ×1 IMPLANT
CATH SPRT ANG 65X2.3FR PLATN (CATHETERS) IMPLANT
CATH TEMPO AQUA 5F 100CM (CATHETERS) ×1 IMPLANT
CLOSURE MYNX CONTROL 6F/7F (Vascular Products) ×1 IMPLANT
DCB RANGER 6.0X100 135 (BALLOONS) IMPLANT
DEVICE ONE SNARE 10MM (MISCELLANEOUS) ×1 IMPLANT
GLIDEWIRE ADV .035X260CM (WIRE) ×1 IMPLANT
KIT ANGIASSIST CO2 SYSTEM (KITS) ×1 IMPLANT
KIT ENCORE 26 ADVANTAGE (KITS) ×1 IMPLANT
KIT MICROPUNCTURE NIT STIFF (SHEATH) ×1 IMPLANT
KIT PV (KITS) ×2 IMPLANT
PATCH THROMBIX TOPICAL PLAIN (HEMOSTASIS) ×1 IMPLANT
RANGER DCB 6.0X100 135 (BALLOONS) ×2
SHEATH GLIDE SLENDER 4/5FR (SHEATH) ×1 IMPLANT
SHEATH MICROPUNCTURE PEDAL 4FR (SHEATH) ×1 IMPLANT
SHEATH PINNACLE 5F 10CM (SHEATH) ×1 IMPLANT
SHEATH PINNACLE 6F 10CM (SHEATH) ×1 IMPLANT
SHEATH PINNACLE ST 6F 45CM (SHEATH) ×1 IMPLANT
STENT BIOMIMICS 5X150 (Permanent Stent) ×1 IMPLANT
STENT SYNERGY XD 3.50X48 (Permanent Stent) IMPLANT
STOPCOCK MORSE 400PSI 3WAY (MISCELLANEOUS) ×1 IMPLANT
SYNERGY XD 3.50X48 (Permanent Stent) ×2 IMPLANT
SYR MEDRAD MARK V 150ML (SYRINGE) ×1 IMPLANT
TRANSDUCER W/STOPCOCK (MISCELLANEOUS) ×2 IMPLANT
TRAY PV CATH (CUSTOM PROCEDURE TRAY) ×2 IMPLANT
WIRE BENTSON .035X145CM (WIRE) ×1 IMPLANT
WIRE SHEPHERD 4G .018 (WIRE) ×1 IMPLANT
WIRE SPARTACORE .014X190CM (WIRE) ×1 IMPLANT
WIRE SPARTACORE .014X300CM (WIRE) ×2 IMPLANT

## 2021-06-12 NOTE — Op Note (Addendum)
Patient name: [name] MRN: [id number] DOB: 1940-02-12 Sex: female  06/12/2021 Pre-operative Diagnosis: critical right lower extremity ischemia with rest pain Post-operative diagnosis:  Same Surgeon:  Eda Paschal. Donzetta Matters, MD Procedure Performed: 1.  Ultrasound-guided cannulation left common femoral artery 2.  CO2 aortogram with right lower extremity angiography 3.  Ultrasound-guided cannulation right dorsalis pedis artery 4.  Laser atherectomy of in-stent restenosis right SFA, occluded right popliteal artery and occluded anterior tibial artery with 1.53m [place] 5.  Drug-coated balloon angioplasty right SFA in-stent restenosis with 6 mm Ranger 6.  Stent of right SFA and popliteal with 5 x 150 mm bio mimics and stent of right anterior tibial artery with 3.5 x 479mSynergy 7.  Moderate sedation with fentanyl and Versed 114 minutes 8.  Mynx device closure left common femoral artery   Indications: [age]ear old female with a history of a left lower extremity amputation.  She now has critical right lower extremity ischemia with rest pain.  She has no ulcer toe pressure of less than 30 and no foot infection.   Findings: The aorta and iliac segments are calcified however there is no flow-limiting stenosis and there is a bounding right femoral pulse.  The SFA had high-grade in-stent restenosis in the mid segment after laser arthrectomy and drug-coated balloon angioplasty this was reduced from 75% to 0.  The popliteal and anterior tibial arteries were occluded with distal reconstitution and after stenting these were resolved to 0%.  At completion there was a palpable anterior tibial artery pulse at the ankle.   Procedure:  The patient was identified in the holding area and taken to room 8.  The patient was then placed supine on the table and prepped and draped in the usual sterile fashion.  A time out was called.  Ultrasound was used to evaluate the left common femoral artery which was calcified.   The area was anesthetized 1% lidocaine cannulated by functional followed by wire and sheath.  Images saved the permanent record.  We placed a Bentson wire followed by 5 FrPakistanheath.  CO2 aortogram was performed.  We then crossed the bifurcation perform limited CO2 angiography of the right lower extremity.  With the identified in-stent restenosis we elected placed a long 6 French sheath patient was fully heparinized.  We able to cross the in-stent restenosis with Glidewire advantage and quick cross catheter.  Below the knee we performed contrasted angiography which unfortunately only demonstrated distal reconstitution of an anterior tibial artery.  At this time the intertibial artery was planned for cannulation by prepping and the foot and the ankle on the right side.  We then identified the right dorsalis pedis on the foot with ultrasound guidance cannulated this with micropuncture needle followed by wire and sheath.  We performed retrograde angiography which demonstrated patency up to the level of the mid leg.  We were then able to cross retrograde using an 018 wire and CXI catheter.  We snared in a retrograde fashion using a versa core wire.  We then performed laser arthrectomy of the SFA in-stent restenosis, the popliteal on the right as well as the anterior tibial artery.  This was all then ballooned with 2.5 mm balloon distally and in the in-stent restenosis of 5 mm balloon.  We then completed with 6 mm drug-coated balloon angioplasty of the in-stent restenosis which decreases to 0% residual.  We then primarily stented the anterior tibial artery angulated into the popliteal artery with a 3.5 mm balloon expandable coronary  stent and then extended up with a 5 mm Biomet max self-expanding stent this was postdilated with a 5 mm balloon.  Completion demonstrated no residual stenosis where previously was occluded.  There was a pressure measured through the sheath and the foot of 039 mmHg systolic.  We then  exchanged for a short 6 French sheath in the groin and deployed a minx device.  On the foot we pulled the sheath and held pressure until hemostasis was obtained.  She tolerated procedure without any complication.   Contrast: 80cc   Tally Mckinnon C. Donzetta Matters, MD [facility] Office: [phone] Pager: [phone]

## 2021-06-12 NOTE — Interval H&P Note (Signed)
History and Physical Interval Note:  06/12/2021 8:46 AM  [name]  has presented today for surgery, with the diagnosis of atherosclerosis of native arteries of extremities with rest pain right lower leg.  The various methods of treatment have been discussed with the patient and family. After consideration of risks, benefits and other options for treatment, the patient has consented to  Procedure(s): ABDOMINAL AORTOGRAM W/LOWER EXTREMITY (N/A) as a surgical intervention.  The patient's history has been reviewed, patient examined, no change in status, stable for surgery.  I have reviewed the patient's chart and labs.  Questions were answered to the patient's satisfaction.     Servando Snare

## 2021-06-13 ENCOUNTER — Encounter (HOSPITAL_COMMUNITY): Payer: Self-pay | Admitting: Vascular Surgery

## 2021-06-13 ENCOUNTER — Telehealth: Payer: Self-pay

## 2021-06-13 MED FILL — Heparin Sodium (Porcine) Inj 1000 Unit/ML: INTRAMUSCULAR | Qty: 10 | Status: AC

## 2021-06-13 NOTE — Telephone Encounter (Signed)
Pt called with c/o burning of her big toe that she has been experiencing for over a month prior to [place] yesterday. She has tried elevating it and that has helped slightly. Per APP, this should resolve over time. She has been advised she can take Tylenol. Pt will call back if this does not improve/worsens.

## 2021-06-14 ENCOUNTER — Encounter (HOSPITAL_COMMUNITY): Payer: Self-pay | Admitting: Vascular Surgery

## 2021-06-15 ENCOUNTER — Telehealth: Payer: Self-pay

## 2021-06-15 NOTE — Telephone Encounter (Signed)
Pt called with c/o mild swelling of her RLE s/p AGM this week. She notices it improves throughout night and worsens once she is up on it in the morning. Pt has been advised to elevate it throughout the day and to call us if swelling worsens. Advised against putting any heat or ice on the area, as she asked if this would help.

## 2021-06-21 ENCOUNTER — Ambulatory Visit: Payer: Medicare Other | Admitting: Physical Therapy

## 2021-06-21 NOTE — Progress Notes (Signed)
Office Note     CC:  follow up Requesting Provider:  [name], MD  HPI: [name] is a 81 y.o. (12-Sep-1940) female who presents for follow up after Angiogram. On 06/12/21 she underwent CO2 aortogram with right lower extremity angiography, Laser atherectomy of in-stent restenosis right SFA, occluded right popliteal artery and occluded anterior tibial artery, Drug-coated balloon angioplasty right SFA in-stent restenosis, Stenting  of right SFA and popliteal and stent of right anterior tibial artery by Dr. Donzetta Matters. This was for rest pain with absence of toe pressure.   Since her intervention she has had continued burning pain in her right great toe as well as increased swelling and redness since her procedure.This has since resolved. She has been elevating her leg which helps. The swelling seems to be more prominent when she is up on her legs more. She is not longer having any pain or burning. She denies any fever or chills.  She has history of LLE claudication with a non healing wound.  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 She is established with [facility] for prothesis. She just got her prosthesis on Friday. She starts PT next week.  The pt is not on a statin for cholesterol management. Has intolerance The pt is on a daily aspirin. Other AC:  The pt is on CCB, BB for hypertension.   The pt is diabetic. 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[place])    Claudication ([place])    Coronary artery disease    stent 2007   Depression    Diabetes mellitus    Difficulty sleeping    Diverticulosis 12/10/03    Elevated cholesterol    Generalized weakness    GERD (gastroesophageal reflux disease)    Heart murmur    Hepatitis    as an infant   HSV-1 (herpes simplex virus 1) infection    Hyperlipidemia    Hypertension    Hypothyroidism    Osteoarthritis    PAD (peripheral artery disease) ([place])    Personal history of chemotherapy    Personal history of radiation therapy    Pneumonia    2014 september and March 2022   Rheumatoid arthritis ([place])    S/P aortic valve replacement with bioprosthetic valve 05/04/2020   [facility] Inspiris Resilia stented bovine pericardial tissue valve, size 23 mm   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   LIMA to D3, SVG to PDA, EVH via right thigh   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   Dr [name]   Spinal stenosis    Thyroid disease     Past Surgical History:  Procedure Laterality Date   ABDOMINAL AORTOGRAM W/LOWER EXTREMITY Bilateral 04/29/2019   Procedure: ABDOMINAL AORTOGRAM W/LOWER EXTREMITY;  Surgeon: Marty Heck, MD;  Location: [facility];  Service: Cardiovascular;  Laterality: Bilateral;   ABDOMINAL AORTOGRAM W/LOWER EXTREMITY Left 07/01/2019   Procedure: ABDOMINAL AORTOGRAM W/ Left LOWER EXTREMITY [place];  Surgeon: Marty Heck, MD;  Location: [facility];  Service: Cardiovascular;  Laterality: Left;   ABDOMINAL AORTOGRAM W/LOWER EXTREMITY Left 03/03/2021   Procedure: ABDOMINAL AORTOGRAM W/LOWER EXTREMITY;  Surgeon: Cherre Robins, MD;  Location: [facility];  Service: Cardiovascular;  Laterality: Left;   ABDOMINAL AORTOGRAM W/LOWER EXTREMITY Right 06/12/2021   Procedure: ABDOMINAL AORTOGRAM W/LOWER EXTREMITY;  Surgeon: Waynetta Sandy, MD;  Location: [facility];  Service: Cardiovascular;  Laterality: Right;  LOWER LEG   AMPUTATION Left 03/06/2021   Procedure: LEFT ABOVE KNEE AMPUTATION;  Surgeon: [name], MD;  Location: [facility];  Service: Vascular;  Laterality: Left;   ANTERIOR AND POSTERIOR REPAIR N/A  04/05/2016   Procedure: ANTERIOR (CYSTOCELE) AND POSTERIOR REPAIR (RECTOCELE);  Surgeon: [name], MD;  Location: [facility];  Service: Gynecology;  Laterality: N/A;   AORTIC VALVE REPLACEMENT N/A 05/04/2020   Procedure: AORTIC VALVE REPLACEMENT (AVR) USING INSPIRIS 23MM RESILIA AORTIC VALVE;  Surgeon: Rexene Alberts, MD;  Location: [facility];  Service: Open Heart Surgery;  Laterality: N/A;   BREAST BIOPSY     BREAST LUMPECTOMY  1983   benign biopsy   BREAST LUMPECTOMY Right 12/29/2010   snbx, ER/PR +, Her2 -, 0/1 node pos.   CARPAL TUNNEL RELEASE Bilateral 9/12,8,12   CHOLECYSTECTOMY     COLONOSCOPY N/A 02/09/2013   Procedure: COLONOSCOPY;  Surgeon: [name], MD;  Location: [facility];  Service: Endoscopy;  Laterality: N/A;   COLONOSCOPY     CORONARY ANGIOPLASTY     CORONARY ARTERY BYPASS GRAFT N/A 05/04/2020   Procedure: CORONARY ARTERY BYPASS GRAFTING (CABG), ON PUMP, TIMES TWO, USING LEFT INTERNAL MAMMARY ARTERY AND RIGHT ENDOSCOPICALLY HARVESTED GREATER SAPHENOUS VEIN;  Surgeon: Rexene Alberts, MD;  Location: [facility];  Service: Open Heart Surgery;  Laterality: N/A;   DILATION AND CURETTAGE OF UTERUS     ESOPHAGOGASTRODUODENOSCOPY N/A 02/09/2013   Procedure: ESOPHAGOGASTRODUODENOSCOPY (EGD);  Surgeon: [name], MD;  Location: [name];  Service: Endoscopy;  Laterality: N/A;   FOOT SPURS     HYSTEROSCOPY WITH D & C N/A 09/11/2012   Procedure: DILATATION AND CURETTAGE ;  Surgeon: [name], MD;  Location: [facility];  Service: Gynecology;  Laterality: N/A;   KNEE SURGERY  2007   LAPAROSCOPIC ASSISTED VAGINAL HYSTERECTOMY N/A 04/05/2016   Procedure: LAPAROSCOPIC ASSISTED VAGINAL HYSTERECTOMY possible BSO;  Surgeon: [name], MD;  Location: [facility];  Service: Gynecology;  Laterality: N/A;   LUMBAR LAMINECTOMY/DECOMPRESSION MICRODISCECTOMY N/A 10/20/2014   Procedure: MICRO LUMBER DECOMPRESSION L3-4 L4-5;  Surgeon: [name], MD;  [facility]: [facility];  Service: Orthopedics;   Laterality: N/A;   LUMBAR LAMINECTOMY/DECOMPRESSION MICRODISCECTOMY Bilateral 10/13/2015   Procedure: MICRO LUMBAR DECOMPRESSION L5 - S1 AND REDO DECOMPRESSION L4 - L5 AND REMOVAL OF FACET CYST L4 - L5 2 LEVELS;  Surgeon: [name], MD;  Location: [facility];  Service: Orthopedics;  Laterality: Bilateral;   PERIPHERAL VASCULAR ATHERECTOMY Right 06/12/2021   Procedure: PERIPHERAL VASCULAR ATHERECTOMY;  Surgeon: Waynetta Sandy, MD;  Location: [facility];  Service: Cardiovascular;  Laterality: Right;  SFA/POP/AT   PERIPHERAL VASCULAR INTERVENTION Right 04/29/2019   Procedure: PERIPHERAL VASCULAR INTERVENTION;  Surgeon: Marty Heck, MD;  Location: [facility];  Service: Cardiovascular;  Laterality: Right;   PERIPHERAL VASCULAR INTERVENTION Right 06/12/2021   Procedure: PERIPHERAL VASCULAR INTERVENTION;  Surgeon: Waynetta Sandy, MD;  Location: [facility];  Service: Cardiovascular;  Laterality: Right;  SFP/POPLITEAL/AT   PORT-A-CATH REMOVAL  04/17/2011   Procedure: REMOVAL PORT-A-CATH;  Surgeon: [name], MD;  Location: [facility];  Service: General;  Laterality: Left;   PORTACATH PLACEMENT  02/07/2011   Procedure: INSERTION PORT-A-CATH;  Surgeon: [name], MD;  [facility];  Service: General;  Laterality: N/A;   RIGHT/LEFT HEART CATH AND CORONARY ANGIOGRAPHY N/A 03/17/2020   Procedure: RIGHT/LEFT HEART CATH AND CORONARY ANGIOGRAPHY;  Surgeon: Burnell Blanks, MD;  Location: [facility];  Service: Cardiovascular;  Laterality: N/A;   TEE WITHOUT CARDIOVERSION N/A 05/04/2020   Procedure: TRANSESOPHAGEAL ECHOCARDIOGRAM (TEE);  Surgeon: Rexene Alberts, MD;  Location: [facility];  Service: Open Heart Surgery;  Laterality: N/A;   UPPER GASTROINTESTINAL ENDOSCOPY      Social History   Socioeconomic History   Marital status: Married    Spouse name: Not on file   Number of children: 3   Years of education: Not  on file   Highest education level: Not on file  Occupational History   Occupation: retired-bookkeeping  Tobacco Use   Smoking status: Former    Packs/day: 2.00    Years: 40.00    Pack years: 80.00    Types: Cigarettes    Quit date: 12/19/1997    Years since quitting: 23.5   Smokeless tobacco: Never  Vaping Use   Vaping Use: Never used  Substance and Sexual Activity   Alcohol use: Yes    Alcohol/week: 1.0 standard drink    Types: 1 Glasses of wine per week    Comment: occasiona - maybe once a month   Drug use: No   Sexual activity: Not Currently    Comment: menarch age 69, P48, HRT X 10 YRS, MENOPAUSE MID 40'S  Other Topics Concern   Not on file  Social History Narrative   One daughter died from suicide at age 13.   Social Determinants of Health   Financial Resource Strain: Low Risk    Difficulty of Paying Living Expenses: Not hard at all  Food Insecurity: No Food Insecurity   Worried About Charity fundraiser in the Last Year: Never true   [place] in the Last Year: Never true  Transportation Needs: No Transportation Needs   Lack of Transportation (Medical): No   Lack of Transportation (Non-Medical): No  Physical Activity: Insufficiently Active   Days of Exercise per Week: 2 days   Minutes of Exercise per Session: 20 min  Stress: No Stress Concern Present   Feeling of Stress : Only a little  Social Connections: Moderately Integrated   Frequency of Communication with Friends and Family: Three times a week   Frequency of Social Gatherings with Friends and Family: Twice a week   Attends Religious Services: More than 4 times per year   Active Member of [employer] or Organizations: No   Attends Music therapist: Never   Marital Status: Married  Human resources officer Violence: Not At Risk   Fear of Current or Ex-Partner: No   Emotionally Abused: No   Physically Abused: No   Sexually Abused: No    Family History  Problem Relation Age of Onset   Kidney  disease Mother    Heart disease Mother    Throat cancer Father    Alcoholism Father    Heart attack Father    Lymphoma Brother 84   Heart attack Brother    Diabetes Brother    Hypertension Brother    Cancer Son    Colon cancer Neg Hx    Stroke Neg Hx  Esophageal cancer Neg Hx    Rectal cancer Neg Hx    Stomach cancer Neg Hx     Current Outpatient Medications  Medication Sig Dispense Refill   Accu-Chek FastClix Lancets MISC USE TO CHECK BLOOD SUGARS 3 TIMES DAILY 306 each 3   acetaminophen (TYLENOL) 500 MG tablet Take 500 mg by mouth every 6 (six) hours as needed for mild pain or headache.     amLODipine (NORVASC) 10 MG tablet Take 1 tablet (10 mg total) by mouth daily. 90 tablet 3   ascorbic acid (VITAMIN C) 250 MG tablet Take 1 tablet (250 mg total) by mouth 2 (two) times daily. 60 tablet 0   aspirin EC 81 MG tablet Take 81 mg by mouth in the morning.     blood glucose meter kit and supplies Dispense based on patient and insurance preference. Use up to four times daily as directed. (FOR ICD-10 E10.9, E11.9). 1 each 0   Blood Glucose Monitoring Suppl DEVI by Does not apply route. Accu Check Guide     Cholecalciferol (VITAMIN D3) 25 MCG (1000 UT) CAPS Take 2,000 Units by mouth daily before lunch.     clopidogrel (PLAVIX) 75 MG tablet Take 1 tablet (75 mg total) by mouth daily. 30 tablet 11   Evolocumab (REPATHA SURECLICK) 947 MG/ML SOAJ Inject 1 pen into the skin every 14 (fourteen) days. (Patient taking differently: Inject 140 mg into the skin every 14 (fourteen) days.) 2 mL 11   glipiZIDE (GLUCOTROL XL) 10 MG 24 hr tablet Take 1 tablet (10 mg total) by mouth daily with breakfast. 90 tablet 3   glucose blood (ACCU-CHEK GUIDE) test strip TEST ONCE DAILY 100 each 5   glucose blood test strip 1 each by Other route as needed for other. Use as instructed  Please dispense Accu check Guide Test strips     Golimumab (SIMPONI ARIA IV) Inject into the vein every 2 (two) months.      icosapent Ethyl (VASCEPA) 1 g capsule Take 2 capsules (2 g total) by mouth 2 (two) times daily. 120 capsule 11   insulin glargine (LANTUS) 100 UNIT/ML Solostar Pen Inject 15 Units into the skin 2 (two) times daily. 15 mL 3   Insulin Pen Needle (CAREFINE PEN NEEDLES) 32G X 6 MM MISC 1 application by Does not apply route 2 (two) times daily. 120 each 1   levothyroxine (SYNTHROID) 125 MCG tablet Take 125 mcg by mouth daily before breakfast.     metoprolol tartrate (LOPRESSOR) 50 MG tablet Take 1 tablet (50 mg total) by mouth 2 (two) times daily. 60 tablet 0   Multiple Vitamin (MULTIVITAMIN WITH MINERALS) TABS tablet Take 1 tablet by mouth daily.     pantoprazole (PROTONIX) 40 MG tablet Take 1 tablet (40 mg total) by mouth daily before breakfast.     PARoxetine (PAXIL) 20 MG tablet Take 1 tablet (20 mg total) by mouth in the morning. 90 tablet 3   Semaglutide,0.25 or 0.5MG/DOS, 2 MG/1.5ML SOPN Inject 0.5 mg into the skin every Tuesday.     valACYclovir (VALTREX) 1000 MG tablet Take 1 tablet (1,000 mg total) by mouth daily as needed (Fever Blisters). 180 tablet 0   zinc sulfate 220 (50 Zn) MG capsule Take 1 capsule (220 mg total) by mouth daily. 30 capsule 0   docusate sodium (COLACE) 100 MG capsule Take 1 capsule (100 mg total) by mouth 2 (two) times daily. (Patient not taking: Reported on 06/06/2021) 60 capsule 0   nortriptyline (PAMELOR)  10 MG capsule Take 1 capsule (10 mg total) by mouth at bedtime. (Patient not taking: Reported on 06/06/2021) 30 capsule 1   traMADol (ULTRAM) 50 MG tablet Take 1 tablet (50 mg total) by mouth every 12 (twelve) hours. (Patient not taking: Reported on 06/06/2021) 60 tablet 0   No current facility-administered medications for this visit.    Allergies  Allergen Reactions   Codeine Nausea And Vomiting and Other (See Comments)    Severe stomach cramps, also   Antihistamines, Diphenhydramine-Type Other (See Comments)    Causes hyperactivity   Gabapentin     Urinary  incontinence   Lyrica [Pregabalin]     Double vision/falls   Oxycodone     Hallucinations/crazy feelings   Statins Other (See Comments)    Joint pains   Sulfa Antibiotics Other (See Comments)    Reaction not recalled   Erythromycin Hives, Rash and Other (See Comments)    Allergic due to dental work from 50 years ago. It was used in packing and resulted in rash/hives inside and outside of mouth.   Trulicity [Dulaglutide] Itching     REVIEW OF SYSTEMS:  _0  denotes positive finding, _1  denotes negative finding Cardiac  Comments:  Chest pain or chest pressure:    Shortness of breath upon exertion:    Short of breath when lying flat:    Irregular heart rhythm:        Vascular    Pain in calf, thigh, or hip brought on by ambulation:    Pain in feet at night that wakes you up from your sleep:     Blood clot in your veins:    Leg swelling:  X       Pulmonary    Oxygen at home:    Productive cough:     Wheezing:         Neurologic    Sudden weakness in arms or legs:     Sudden numbness in arms or legs:     Sudden onset of difficulty speaking or slurred speech:    Temporary loss of vision in one eye:     Problems with dizziness:         Gastrointestinal    Blood in stool:     Vomited blood:         Genitourinary    Burning when urinating:     Blood in urine:        Psychiatric    Major depression:         Hematologic    Bleeding problems:    Problems with blood clotting too easily:        Skin    Rashes or ulcers:        Constitutional    Fever or chills:      PHYSICAL EXAMINATION:  Vitals:   06/27/21 0922  BP: 129/61  Pulse: 78  Resp: 18  Temp: 97.7 F (36.5 C)  TempSrc: Temporal  SpO2: 97%  Weight: 131 lb (59.4 kg)  Height: _2  (1.6 m)    General:  WDWN in NAD; vital signs documented above Gait: Not observed, in wheel chair HENT: WNL, normocephalic Pulmonary: normal non-labored breathing , without wheezing Cardiac: regular HR, without   Murmurs carotid bruit Vascular Exam/Pulses: 2+ femoral pulses, palpable right DP. Doppler right Dp/ PT Extremities: without ischemic changes, without Gangrene , without cellulitis; without open wounds; does have mild edema in right medial ankle and foot. Some sloughing of skin over big toe.  No erythema Musculoskeletal: no muscle wasting or atrophy  Neurologic: A&O X 3;  No focal weakness or paresthesias are detected Psychiatric:  The pt has Normal affect.  ASSESSMENT/PLAN:: 81 y.o. female here for follow up for PAD. On 06/12/21 she underwent CO2 aortogram with right lower extremity angiography, Laser atherectomy of in-stent restenosis right SFA, occluded right popliteal artery and occluded anterior tibial artery, Drug-coated balloon angioplasty right SFA in-stent restenosis, Stenting  of right SFA and popliteal and stent of right anterior tibial artery by Dr. Donzetta Matters. This was for rest pain with absence of toe pressure. Her toe pain is resolved. She had developed some swelling, redness and warmth in her right foot and great toe with burning. This is now resolved. Right leg is well perfused and warm with palpable DP. - Encourage her to elevate her right leg to help with swelling - She just received LLE Prosthesis and is starting her therapy soon - She will follow up in 4 weeks with RLE arterial duplex and ABI   Karoline Caldwell, PA-C [facility] [phone]  Clinic MD:   Carlis Abbott

## 2021-06-27 ENCOUNTER — Ambulatory Visit (INDEPENDENT_AMBULATORY_CARE_PROVIDER_SITE_OTHER): Payer: Medicare Other | Admitting: Physician Assistant

## 2021-06-27 VITALS — BP 129/61 | HR 78 | Temp 97.7°F | Resp 18 | Ht 63.0 in | Wt 131.0 lb

## 2021-06-27 DIAGNOSIS — I739 Peripheral vascular disease, unspecified: Secondary | ICD-10-CM | POA: Diagnosis not present

## 2021-07-03 ENCOUNTER — Other Ambulatory Visit: Payer: Self-pay | Admitting: *Deleted

## 2021-07-03 ENCOUNTER — Ambulatory Visit (INDEPENDENT_AMBULATORY_CARE_PROVIDER_SITE_OTHER): Payer: Medicare Other | Admitting: Family Medicine

## 2021-07-03 VITALS — BP 130/66 | HR 73 | Temp 97.1°F | Ht 63.0 in | Wt 143.6 lb

## 2021-07-03 DIAGNOSIS — I739 Peripheral vascular disease, unspecified: Secondary | ICD-10-CM

## 2021-07-03 DIAGNOSIS — S78112A Complete traumatic amputation at level between left hip and knee, initial encounter: Secondary | ICD-10-CM

## 2021-07-03 DIAGNOSIS — E1142 Type 2 diabetes mellitus with diabetic polyneuropathy: Secondary | ICD-10-CM

## 2021-07-03 DIAGNOSIS — Z794 Long term (current) use of insulin: Secondary | ICD-10-CM | POA: Diagnosis not present

## 2021-07-03 DIAGNOSIS — E039 Hypothyroidism, unspecified: Secondary | ICD-10-CM | POA: Diagnosis not present

## 2021-07-03 DIAGNOSIS — E785 Hyperlipidemia, unspecified: Secondary | ICD-10-CM

## 2021-07-03 LAB — GLUCOSE, RANDOM: Glucose, Bld: 158 mg/dL — ABNORMAL HIGH (ref 70–99)

## 2021-07-03 LAB — TSH: TSH: 1.08 u[IU]/mL (ref 0.35–5.50)

## 2021-07-03 LAB — HEMOGLOBIN A1C: Hgb A1c MFr Bld: 7 % — ABNORMAL HIGH (ref 4.6–6.5)

## 2021-07-03 NOTE — Progress Notes (Signed)
[facility] [address] [address] Dept: [phone] Dept Fax: [phone]  Chronic Care Office Visit  Subjective:    Patient ID: [name], female    DOB: 06-27-1940, 81 y.o..   MRN: [id number]  Chief Complaint  Patient presents with   Follow-up    3 month f/u., fasting today. BS:78, 88 ,84     History of Present Illness:  Patient is in today for reassessment of chronic medical issues.  [name] has a history of peripheral artery disease. She underwent a left AKA in Feb. due to advanced disease and a non-healing wound on the left foot.  She notes she recently received her prosthesis and will soon be doing PT to improve her mobility with this. She also recently had a repeat catheterization of the aorta dn right lower extremity. She notes she had two further stent placements. She has noted some mild swelling of the foot, though this is improving.   [name] has a past history of aortic stenosis and coronary artery disease. She had CABG x 2 and aortic valve replacement in 04/2020. She also has hypertension and hyperlipidemia.  She is managed on aspirin, Plavix, metoprolol, and amlodipine. She has been intolerant of statins and is on Repatha and Vascepa.   [name] has a history of Type 2 diabetes. She is managed on glipizide, semaglutide, and insulin glargine. Her diabetes is complicated with peripheral neuropathy.   [name] has a history of hypothyroidism. She is managed on levothyroxine.   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   Primary insomnia 11/02/2020   Irritation of eyelid 07/26/2020   S/P aortic valve replacement with bioprosthetic valve 05/04/2020   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   Peripheral artery disease (HCC)    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[place]) 04/07/2019   Low back pain 02/03/2019   Scoliosis deformity of spine 02/03/2019   Osteoarthritis of hip 11/17/2018   Claudication ([place])    Chronic headaches 11/12/2017   Degeneration of lumbar intervertebral disc 06/13/2017   Hyperkalemia 06/03/2017   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[place])    Mass of left adrenal gland ([place]) 04/19/2017   Spinal stenosis of lumbar region 10/20/2014   Hyperlipidemia 03/16/2013   Dense breasts 12/22/2012   Anxiety state 04/16/2012   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/16/2012   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   Osteoarthritis of knee 04/16/2012   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 female breast ([place]) 12/20/2010   History of breast cancer 12/05/2010   Depression 09/14/2008   GERD 09/14/2008   Type 2 diabetes mellitus with neurologic complication, with long-term current use of insulin ([place]) 05/20/2008   Coronary atherosclerosis 04/17/2007   Hypothyroidism 04/03/2007   Past Surgical History:  Procedure Laterality Date   ABDOMINAL AORTOGRAM W/LOWER EXTREMITY Bilateral 04/29/2019   Procedure: ABDOMINAL AORTOGRAM W/LOWER EXTREMITY;  Surgeon: Marty Heck, MD;  Location: [facility];  Service: Cardiovascular;  Laterality: Bilateral;  ABDOMINAL AORTOGRAM W/LOWER EXTREMITY Left 07/01/2019   Procedure: ABDOMINAL AORTOGRAM W/ Left LOWER EXTREMITY [place];  Surgeon: Marty Heck, MD;  Location: [facility];  Service: Cardiovascular;  Laterality: Left;   ABDOMINAL AORTOGRAM W/LOWER EXTREMITY Left 03/03/2021   Procedure:  ABDOMINAL AORTOGRAM W/LOWER EXTREMITY;  Surgeon: Cherre Robins, MD;  Location: [facility];  Service: Cardiovascular;  Laterality: Left;   ABDOMINAL AORTOGRAM W/LOWER EXTREMITY Right 06/12/2021   Procedure: ABDOMINAL AORTOGRAM W/LOWER EXTREMITY;  Surgeon: Waynetta Sandy, MD;  Location: [facility];  Service: Cardiovascular;  Laterality: Right;  LOWER LEG   AMPUTATION Left 03/06/2021   Procedure: LEFT ABOVE KNEE AMPUTATION;  Surgeon: [name], MD;  Location: [facility];  Service: Vascular;  Laterality: Left;   ANTERIOR AND POSTERIOR REPAIR N/A 04/05/2016   Procedure: ANTERIOR (CYSTOCELE) AND POSTERIOR REPAIR (RECTOCELE);  Surgeon: [name], MD;  Location: [facility];  Service: Gynecology;  Laterality: N/A;   AORTIC VALVE REPLACEMENT N/A 05/04/2020   Procedure: AORTIC VALVE REPLACEMENT (AVR) USING INSPIRIS 23MM RESILIA AORTIC VALVE;  Surgeon: Rexene Alberts, MD;  Location: [facility];  Service: Open Heart Surgery;  Laterality: N/A;   BREAST BIOPSY     BREAST LUMPECTOMY  1983   benign biopsy   BREAST LUMPECTOMY Right 12/29/2010   snbx, ER/PR +, Her2 -, 0/1 node pos.   CARPAL TUNNEL RELEASE Bilateral 9/12,8,12   CHOLECYSTECTOMY     COLONOSCOPY N/A 02/09/2013   Procedure: COLONOSCOPY;  Surgeon: [name], MD;  [facility]: [facility];  Service: Endoscopy;  Laterality: N/A;   COLONOSCOPY     CORONARY ANGIOPLASTY     CORONARY ARTERY BYPASS GRAFT N/A 05/04/2020   Procedure: CORONARY ARTERY BYPASS GRAFTING (CABG), ON PUMP, TIMES TWO, USING LEFT INTERNAL MAMMARY ARTERY AND RIGHT ENDOSCOPICALLY HARVESTED GREATER SAPHENOUS VEIN;  Surgeon: Rexene Alberts, MD;  Location: [facility];  Service: Open Heart Surgery;  Laterality: N/A;   DILATION AND CURETTAGE OF UTERUS     ESOPHAGOGASTRODUODENOSCOPY N/A 02/09/2013   Procedure: ESOPHAGOGASTRODUODENOSCOPY (EGD);  Surgeon: [name], MD;  Location: [name];  Service: Endoscopy;  Laterality: N/A;   FOOT SPURS     HYSTEROSCOPY WITH  D & C N/A 09/11/2012   Procedure: DILATATION AND CURETTAGE ;  Surgeon: [name], MD;  Location: [facility];  Service: Gynecology;  Laterality: N/A;   KNEE SURGERY  2007   LAPAROSCOPIC ASSISTED VAGINAL HYSTERECTOMY N/A 04/05/2016   Procedure: LAPAROSCOPIC ASSISTED VAGINAL HYSTERECTOMY possible BSO;  Surgeon: [name], MD;  Location: [facility];  Service: Gynecology;  Laterality: N/A;   LUMBAR LAMINECTOMY/DECOMPRESSION MICRODISCECTOMY N/A 10/20/2014   Procedure: MICRO LUMBER DECOMPRESSION L3-4 L4-5;  Surgeon: [name], MD;  Location: [facility];  Service: Orthopedics;  Laterality: N/A;   LUMBAR LAMINECTOMY/DECOMPRESSION MICRODISCECTOMY Bilateral 10/13/2015   Procedure: MICRO LUMBAR DECOMPRESSION L5 - S1 AND REDO DECOMPRESSION L4 - L5 AND REMOVAL OF FACET CYST L4 - L5 2 LEVELS;  Surgeon: [name], MD;  Location: [facility];  Service: Orthopedics;  Laterality: Bilateral;   PERIPHERAL VASCULAR ATHERECTOMY Right 06/12/2021   Procedure: PERIPHERAL VASCULAR ATHERECTOMY;  Surgeon: Waynetta Sandy, MD;  Location: [facility];  Service: Cardiovascular;  Laterality: Right;  SFA/POP/AT   PERIPHERAL VASCULAR INTERVENTION Right 04/29/2019   Procedure: PERIPHERAL VASCULAR INTERVENTION;  Surgeon: Marty Heck, MD;  Location: [facility];  Service: Cardiovascular;  Laterality: Right;   PERIPHERAL VASCULAR INTERVENTION Right 06/12/2021   Procedure: PERIPHERAL VASCULAR INTERVENTION;  Surgeon: Donzetta Matters,  Georgia Dom, MD;  Location: [facility];  Service: Cardiovascular;  Laterality: Right;  SFP/POPLITEAL/AT   PORT-A-CATH REMOVAL  04/17/2011   Procedure: REMOVAL PORT-A-CATH;  Surgeon: [name], MD;  Location: [facility];  Service: General;  Laterality: Left;   PORTACATH PLACEMENT  02/07/2011   Procedure: INSERTION PORT-A-CATH;  Surgeon: [name], MD;  [facility]: [facility];  Service: General;  Laterality: N/A;   RIGHT/LEFT HEART CATH AND CORONARY  ANGIOGRAPHY N/A 03/17/2020   Procedure: RIGHT/LEFT HEART CATH AND CORONARY ANGIOGRAPHY;  Surgeon: Burnell Blanks, MD;  Location: [facility];  Service: Cardiovascular;  Laterality: N/A;   TEE WITHOUT CARDIOVERSION N/A 05/04/2020   Procedure: TRANSESOPHAGEAL ECHOCARDIOGRAM (TEE);  Surgeon: Rexene Alberts, MD;  Location: [facility];  Service: Open Heart Surgery;  Laterality: N/A;   UPPER GASTROINTESTINAL ENDOSCOPY     Family History  Problem Relation Age of Onset   Kidney disease Mother    Heart disease Mother    Throat cancer Father    Alcoholism Father    Heart attack Father    Lymphoma Brother 31   Heart attack Brother    Diabetes Brother    Hypertension Brother    Cancer Son    Colon cancer Neg Hx    Stroke Neg Hx    Esophageal cancer Neg Hx    Rectal cancer Neg Hx    Stomach cancer Neg Hx    Outpatient Medications Prior to Visit  Medication Sig Dispense Refill   Accu-Chek FastClix Lancets MISC USE TO CHECK BLOOD SUGARS 3 TIMES DAILY 306 each 3   acetaminophen (TYLENOL) 500 MG tablet Take 500 mg by mouth every 6 (six) hours as needed for mild pain or headache.     amLODipine (NORVASC) 10 MG tablet Take 1 tablet (10 mg total) by mouth daily. 90 tablet 3   ascorbic acid (VITAMIN C) 250 MG tablet Take 1 tablet (250 mg total) by mouth 2 (two) times daily. 60 tablet 0   aspirin EC 81 MG tablet Take 81 mg by mouth in the morning.     blood glucose meter kit and supplies Dispense based on patient and insurance preference. Use up to four times daily as directed. (FOR ICD-10 E10.9, E11.9). 1 each 0   clopidogrel (PLAVIX) 75 MG tablet Take 1 tablet (75 mg total) by mouth daily. 30 tablet 11   Evolocumab (REPATHA SURECLICK) 119 MG/ML SOAJ Inject 1 pen into the skin every 14 (fourteen) days. (Patient taking differently: Inject 140 mg into the skin every 14 (fourteen) days.) 2 mL 11   glipiZIDE (GLUCOTROL XL) 10 MG 24 hr tablet Take 1 tablet (10 mg total) by mouth daily with  breakfast. 90 tablet 3   glucose blood (ACCU-CHEK GUIDE) test strip TEST ONCE DAILY 100 each 5   Golimumab (SIMPONI ARIA IV) Inject into the vein every 2 (two) months.     icosapent Ethyl (VASCEPA) 1 g capsule Take 2 capsules (2 g total) by mouth 2 (two) times daily. 120 capsule 11   insulin glargine (LANTUS) 100 UNIT/ML Solostar Pen Inject 15 Units into the skin 2 (two) times daily. 15 mL 3   Insulin Pen Needle (CAREFINE PEN NEEDLES) 32G X 6 MM MISC 1 application by Does not apply route 2 (two) times daily. 120 each 1   levothyroxine (SYNTHROID) 125 MCG tablet Take 125 mcg by mouth daily before breakfast.     metoprolol tartrate (LOPRESSOR) 50 MG tablet Take 1 tablet (50 mg  total) by mouth 2 (two) times daily. 60 tablet 0   Multiple Vitamin (MULTIVITAMIN WITH MINERALS) TABS tablet Take 1 tablet by mouth daily.     pantoprazole (PROTONIX) 40 MG tablet Take 1 tablet (40 mg total) by mouth daily before breakfast.     PARoxetine (PAXIL) 20 MG tablet Take 1 tablet (20 mg total) by mouth in the morning. 90 tablet 3   Semaglutide,0.25 or 0.5MG/DOS, 2 MG/1.5ML SOPN Inject 0.5 mg into the skin every Tuesday.     Cholecalciferol (VITAMIN D3) 25 MCG (1000 UT) CAPS Take 2,000 Units by mouth daily before lunch. (Patient not taking: Reported on 07/03/2021)     docusate sodium (COLACE) 100 MG capsule Take 1 capsule (100 mg total) by mouth 2 (two) times daily. 60 capsule 0   glucose blood test strip 1 each by Other route as needed for other. Use as instructed  Please dispense Accu check Guide Test strips     nortriptyline (PAMELOR) 10 MG capsule Take 1 capsule (10 mg total) by mouth at bedtime. (Patient not taking: Reported on 06/06/2021) 30 capsule 1   traMADol (ULTRAM) 50 MG tablet Take 1 tablet (50 mg total) by mouth every 12 (twelve) hours. 60 tablet 0   valACYclovir (VALTREX) 1000 MG tablet Take 1 tablet (1,000 mg total) by mouth daily as needed (Fever Blisters). 180 tablet 0   zinc sulfate 220 (50 Zn) MG  capsule Take 1 capsule (220 mg total) by mouth daily. 30 capsule 0   Blood Glucose Monitoring Suppl DEVI by Does not apply route. Accu Check Guide     No facility-administered medications prior to visit.   Allergies  Allergen Reactions   Codeine Nausea And Vomiting and Other (See Comments)    Severe stomach cramps, also   Antihistamines, Diphenhydramine-Type Other (See Comments)    Causes hyperactivity   Gabapentin     Urinary incontinence   Lyrica [Pregabalin]     Double vision/falls   Oxycodone     Hallucinations/crazy feelings   Statins Other (See Comments)    Joint pains   Sulfa Antibiotics Other (See Comments)    Reaction not recalled   Erythromycin Hives, Rash and Other (See Comments)    Allergic due to dental work from 50 years ago. It was used in packing and resulted in rash/hives inside and outside of mouth.   Trulicity [Dulaglutide] Itching     Objective:   Today's Vitals   07/03/21 0923  BP: 130/66  Pulse: 73  Temp: (!) 97.1 F (36.2 C)  TempSrc: Temporal  SpO2: 96%  Weight: 143 lb 9.6 oz (65.1 kg)  Height: _0  (1.6 m)   Body mass index is 25.44 kg/m.   General: Well developed, well nourished. No acute distress. Extremities: Right lower leg shows mild edema of the foot, but none at the ankle. The foot is warm and has   good capillary refill. Mild maceration between the 4th and 5th toe. Skin otherwise intact. Psych: Alert and oriented. Normal mood and affect.  Health Maintenance Due  Topic Date Due   Hepatitis C Screening  Never done   TETANUS/TDAP  Never done   Zoster Vaccines- Shingrix (2 of 2) 03/29/2011   Lab Results    Latest Ref Rng & Units 06/12/2021    8:45 AM 03/16/2021   11:35 AM 03/14/2021    5:07 AM  BMP  Glucose 70 - 99 mg/dL 168  90  138   BUN 8 - 23 mg/dL 31  29  26   Creatinine 0.44 - 1.00 mg/dL 1.70  1.50  1.44   Sodium 135 - 145 mmol/L 139  135  136   Potassium 3.5 - 5.1 mmol/L 4.3  4.0  5.3   Chloride 98 - 111 mmol/L 103  101   101   CO2 22 - 32 mmol/L  23  26   Calcium 8.9 - 10.3 mg/dL  8.8  9.2      Lab Results  Component Value Date   CHOL 104 03/04/2021   HDL 38 (L) 03/04/2021   LDLCALC 35 03/04/2021   LDLDIRECT 172.0 11/02/2020   TRIG 154 (H) 03/04/2021   CHOLHDL 2.7 03/04/2021   Lab Results  Component Value Date   TSH 5.599 (H) 03/01/2021   Assessment & Plan:   1. Type 2 diabetes mellitus with diabetic polyneuropathy, with long-term current use of insulin ([place]) Due for A1c. [name] is interested in stopping her glipizide if she is able. She will continue insulin glargine 15 units bid, semaglutide 0.5 mg weekly, and glipizide XL 10 mg daily for now.   - Glucose, random - Hemoglobin A1c  2. Peripheral artery disease ([place]) 3. 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  4. Acquired hypothyroidism Due for repeat TSH. Continue levothyroxine 125 mcg daily.  - TSH  5. Hyperlipidemia, unspecified hyperlipidemia type Lipids at goal on Repatha and Vascepa.  Return in about 3 months (around 10/03/2021) for Reassessment.   [name], MD

## 2021-07-04 ENCOUNTER — Ambulatory Visit: Payer: Medicare Other | Attending: Physical Medicine & Rehabilitation | Admitting: Physical Therapy

## 2021-07-04 ENCOUNTER — Encounter: Payer: Self-pay | Admitting: Physical Therapy

## 2021-07-04 DIAGNOSIS — R2681 Unsteadiness on feet: Secondary | ICD-10-CM | POA: Diagnosis present

## 2021-07-04 DIAGNOSIS — S78112A Complete traumatic amputation at level between left hip and knee, initial encounter: Secondary | ICD-10-CM | POA: Insufficient documentation

## 2021-07-04 DIAGNOSIS — R262 Difficulty in walking, not elsewhere classified: Secondary | ICD-10-CM | POA: Diagnosis present

## 2021-07-04 DIAGNOSIS — M6281 Muscle weakness (generalized): Secondary | ICD-10-CM | POA: Insufficient documentation

## 2021-07-04 DIAGNOSIS — R2689 Other abnormalities of gait and mobility: Secondary | ICD-10-CM | POA: Diagnosis present

## 2021-07-04 NOTE — Therapy (Signed)
OUTPATIENT PHYSICAL THERAPY PROSTHETICS EVALUATION   Patient Name: [name] MRN: [id number] DOB:December 11, 1940, 81 y.o., female Today's Date: 07/04/2021  PCP: Arlester Marker  REFERRING PROVIDER: Charlett Blake, MD    PT End of Session - 07/04/21 1456     Visit Number 1    Number of Visits 17    Date for PT Re-Evaluation 08/29/21    Authorization Type [employer]    Authorization Time Period 07/04/21 to 08/29/21    Progress Note Due on Visit 10    PT Start Time 1400    PT Stop Time 8413    PT Time Calculation (min) 42 min    Equipment Utilized During Treatment Other (comment)   prosthetic   Activity Tolerance Patient tolerated treatment well    Behavior During Therapy [facility] for tasks assessed/performed             Past Medical History:  Diagnosis Date   Allergy    Anemia    childhood   Anxiety    Aortic stenosis    Arterial stenosis (HCC)    mesenteric   Arthritis    Breast cancer ([place]) 12/05/10   R breast, inv mammary, in situ,ER/PR +,HER2 -   Cancer ([place])    Carcinoma of breast treated with adjuvant chemotherapy ([place])    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[place])    Claudication ([place])    Coronary artery disease    stent 2007   Depression    Diabetes mellitus    Difficulty sleeping    Diverticulosis 12/10/03   Elevated cholesterol    Generalized weakness    GERD (gastroesophageal reflux disease)    Heart murmur    Hepatitis    as an infant   HSV-1 (herpes simplex virus 1) infection    Hyperlipidemia    Hypertension    Hypothyroidism    Osteoarthritis    PAD (peripheral artery disease) ([place])    Personal history of chemotherapy    Personal history of radiation therapy    Pneumonia    2014 september and March 2022   Rheumatoid arthritis ([place])    S/P aortic valve replacement with bioprosthetic valve 05/04/2020   [facility] Inspiris Resilia stented bovine pericardial tissue valve, size 23 mm   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   LIMA to D3, SVG to PDA, EVH  via right thigh   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   Dr [name]   Spinal stenosis    Thyroid disease    Past Surgical History:  Procedure Laterality Date   ABDOMINAL AORTOGRAM W/LOWER EXTREMITY Bilateral 04/29/2019   Procedure: ABDOMINAL AORTOGRAM W/LOWER EXTREMITY;  Surgeon: Marty Heck, MD;  Location: [place] [facility];  Service: Cardiovascular;  Laterality: Bilateral;   ABDOMINAL AORTOGRAM W/LOWER EXTREMITY Left 07/01/2019   Procedure: ABDOMINAL AORTOGRAM W/ Left LOWER EXTREMITY [place];  Surgeon: Marty Heck, MD;  Location: [place] [facility];  Service: Cardiovascular;  Laterality: Left;   ABDOMINAL AORTOGRAM W/LOWER EXTREMITY Left 03/03/2021   Procedure: ABDOMINAL AORTOGRAM W/LOWER EXTREMITY;  Surgeon: Cherre Robins, MD;  Location: [place] [facility];  Service: Cardiovascular;  Laterality: Left;   ABDOMINAL AORTOGRAM W/LOWER EXTREMITY Right 06/12/2021   Procedure: ABDOMINAL AORTOGRAM W/LOWER EXTREMITY;  Surgeon: Waynetta Sandy, MD;  Location: [place] [facility];  Service: Cardiovascular;  Laterality: Right;  LOWER LEG   AMPUTATION Left 03/06/2021   Procedure: LEFT ABOVE KNEE AMPUTATION;  Surgeon: [name], MD;  Location: [facility]  OR;  Service: Vascular;  Laterality: Left;   ANTERIOR AND POSTERIOR REPAIR N/A 04/05/2016   Procedure: ANTERIOR (CYSTOCELE) AND POSTERIOR REPAIR (RECTOCELE);  Surgeon: [name], MD;  Location: [place] [facility];  Service: Gynecology;  Laterality: N/A;   AORTIC VALVE REPLACEMENT N/A 05/04/2020   Procedure: AORTIC VALVE REPLACEMENT (AVR) USING INSPIRIS 23MM RESILIA AORTIC VALVE;  Surgeon: Rexene Alberts, MD;  Location: [place];  Service: Open Heart Surgery;  Laterality: N/A;   BREAST BIOPSY     BREAST LUMPECTOMY  1983   benign biopsy   BREAST LUMPECTOMY Right 12/29/2010   snbx, ER/PR +, Her2 -, 0/1 node pos.   CARPAL TUNNEL RELEASE Bilateral 9/12,8,12   CHOLECYSTECTOMY     COLONOSCOPY N/A 02/09/2013   Procedure: COLONOSCOPY;   Surgeon: [name], MD;  [facility]: [facility];  Service: Endoscopy;  Laterality: N/A;   COLONOSCOPY     CORONARY ANGIOPLASTY     CORONARY ARTERY BYPASS GRAFT N/A 05/04/2020   Procedure: CORONARY ARTERY BYPASS GRAFTING (CABG), ON PUMP, TIMES TWO, USING LEFT INTERNAL MAMMARY ARTERY AND RIGHT ENDOSCOPICALLY HARVESTED GREATER SAPHENOUS VEIN;  Surgeon: Rexene Alberts, MD;  Location: [place];  Service: Open Heart Surgery;  Laterality: N/A;   DILATION AND CURETTAGE OF UTERUS     ESOPHAGOGASTRODUODENOSCOPY N/A 02/09/2013   Procedure: ESOPHAGOGASTRODUODENOSCOPY (EGD);  Surgeon: [name], MD;  Location: [name];  Service: Endoscopy;  Laterality: N/A;   FOOT SPURS     HYSTEROSCOPY WITH D & C N/A 09/11/2012   Procedure: DILATATION AND CURETTAGE ;  Surgeon: [name], MD;  Location: [place] [facility];  Service: Gynecology;  Laterality: N/A;   KNEE SURGERY  2007   LAPAROSCOPIC ASSISTED VAGINAL HYSTERECTOMY N/A 04/05/2016   Procedure: LAPAROSCOPIC ASSISTED VAGINAL HYSTERECTOMY possible BSO;  Surgeon: [name], MD;  Location: [place] [facility];  Service: Gynecology;  Laterality: N/A;   LUMBAR LAMINECTOMY/DECOMPRESSION MICRODISCECTOMY N/A 10/20/2014   Procedure: MICRO LUMBER DECOMPRESSION L3-4 L4-5;  Surgeon: [name], MD;  [facility];  Service: Orthopedics;  Laterality: N/A;   LUMBAR LAMINECTOMY/DECOMPRESSION MICRODISCECTOMY Bilateral 10/13/2015   Procedure: MICRO LUMBAR DECOMPRESSION L5 - S1 AND REDO DECOMPRESSION L4 - L5 AND REMOVAL OF FACET CYST L4 - L5 2 LEVELS;  Surgeon: [name], MD;  Location: [facility];  Service: Orthopedics;  Laterality: Bilateral;   PERIPHERAL VASCULAR ATHERECTOMY Right 06/12/2021   Procedure: PERIPHERAL VASCULAR ATHERECTOMY;  Surgeon: Waynetta Sandy, MD;  Location: [place] [facility];  Service: Cardiovascular;  Laterality: Right;  SFA/POP/AT   PERIPHERAL VASCULAR INTERVENTION Right 04/29/2019   Procedure: PERIPHERAL VASCULAR INTERVENTION;  Surgeon: Marty Heck, MD;  Location: [place] [facility];  Service: Cardiovascular;  Laterality: Right;   PERIPHERAL VASCULAR INTERVENTION Right 06/12/2021   Procedure: PERIPHERAL VASCULAR INTERVENTION;  Surgeon: Waynetta Sandy, MD;  Location: [place] [facility];  Service: Cardiovascular;  Laterality: Right;  SFP/POPLITEAL/AT   PORT-A-CATH REMOVAL  04/17/2011   Procedure: REMOVAL PORT-A-CATH;  Surgeon: [name], MD;  Location: [place];  Service: General;  Laterality: Left;   PORTACATH PLACEMENT  02/07/2011   Procedure: INSERTION PORT-A-CATH;  Surgeon: [name], MD;  [facility];  Service: General;  Laterality: N/A;   RIGHT/LEFT HEART CATH AND CORONARY ANGIOGRAPHY N/A 03/17/2020   Procedure: RIGHT/LEFT HEART CATH AND CORONARY ANGIOGRAPHY;  Surgeon: Burnell Blanks, MD;  Location: [place] [facility];  Service: Cardiovascular;  Laterality: N/A;   TEE WITHOUT CARDIOVERSION N/A 05/04/2020   Procedure: TRANSESOPHAGEAL ECHOCARDIOGRAM (TEE);  Surgeon: Roxy Manns,  Valentina Gu, MD;  Location: [place];  Service: Open Heart Surgery;  Laterality: N/A;   UPPER GASTROINTESTINAL ENDOSCOPY     Patient Active Problem List   Diagnosis Date Noted   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[place]) 03/13/2021   Sinus tachycardia 03/11/2021   Elevated troponin 03/08/2021   Stroke-like symptoms 03/08/2021   Anemia of chronic disease 03/08/2021   Bilateral lower extremity edema 02/21/2021   Acute supraglottitis with epiglottitis 02/05/2021   Epiglottitis 02/05/2021   Reactive airway disease 01/20/2021   Diabetic peripheral neuropathy ([place]) 12/19/2020   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   Primary insomnia 11/02/2020   Irritation of eyelid 07/26/2020   S/P aortic valve replacement with bioprosthetic valve 05/04/2020   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   Peripheral artery disease (HCC)    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[place]) 04/07/2019   Low back  pain 02/03/2019   Scoliosis deformity of spine 02/03/2019   Osteoarthritis of hip 11/17/2018   Claudication ([place])    Chronic headaches 11/12/2017   Degeneration of lumbar intervertebral disc 06/13/2017   Hyperkalemia 06/03/2017   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[place])    Mass of left adrenal gland ([place]) 04/19/2017   Spinal stenosis of lumbar region 10/20/2014   Hyperlipidemia 03/16/2013   Dense breasts 12/22/2012   Anxiety state 04/16/2012   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/16/2012   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   Osteoarthritis of knee 04/16/2012   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 female breast ([place]) 12/20/2010   History of breast cancer 12/05/2010   Depression 09/14/2008   GERD 09/14/2008   Type 2 diabetes mellitus with neurologic complication, with long-term current use of insulin ([place]) 05/20/2008   Coronary atherosclerosis 04/17/2007   Hypothyroidism 04/03/2007    ONSET DATE: 05/29/2021   REFERRING DIAG: U38.453M (ICD-10-CM) - 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[place])   THERAPY DIAG:  Difficulty in walking, not elsewhere classified - Plan: PT plan of care cert/re-cert  Other abnormalities of gait and mobility - Plan: PT plan of care cert/re-cert  Unsteadiness on feet - Plan: PT plan of care cert/re-cert  Muscle weakness (generalized) - Plan: PT plan of care cert/re-cert  Rationale for Evaluation and Treatment Rehabilitation  SUBJECTIVE:   SUBJECTIVE STATEMENT: #1. Left AKA due to peripheral vascular disease. Has had excellent healing of her surgical incision and excellent reduction in postoperative swelling.  Had my AKA in February [facility]. Got my prosthetic about a week ago have worked up to wearing it about 8 hours a day. Had a fall the other night trying to transfer and my right knee gave away, I don't have good circulation in my right leg either but they put new stents  in that leg May 22nd so it should be good for awhile. I do have phantom pain in my AKA but its very seldom.    Pt accompanied by: significant other  PERTINENT HISTORY: PAD, GERD, CKD, DM with neuropathy, OA and RA, scoliosis, inflammatory polyarthropathy, breast CA, HLD, depression, CABG, Lx surgery, knee surgery    PAIN:  Are you having pain? No   PRECAUTIONS: Fall and Other: AKA   WEIGHT BEARING RESTRICTIONS No  FALLS: Has patient fallen in last 6 months? Yes. Number of falls 2  LIVING ENVIRONMENT: Lives with: lives with their spouse Lives in: House/apartment Home Access: [place]: One level Stairs: No Has following  equipment at home: Gilford Rile - 2 wheeled, Wheelchair (manual), [employer], bed side commode, Grab bars, and Ramped entry  PLOF: Independent, Independent with basic ADLs, Independent with gait, and Independent with transfers  PATIENT GOALS get up and get going   OBJECTIVE:    COGNITION: Overall cognitive status: Within functional limits for tasks assessed   SENSATION: WFL  MUSCLE LENGTH: Very tight hip flexors B      LOWER EXTREMITY MMT:  MMT Right eval Left eval  Hip flexion 4- 4+  Hip extension 2+ 3-  Hip abduction 4+ 5  Hip adduction    Hip internal rotation    Hip external rotation    Knee flexion 4+   Knee extension 4+   Ankle dorsiflexion 4+   Ankle plantarflexion    Ankle inversion    Ankle eversion    (Blank rows = not tested)  BED MOBILITY:  Sit to supine Modified independence Supine to sit Modified independence Rolling to Right Modified independence Rolling to Left Modified independence  TRANSFERS: Sit to stand: CGA Stand to sit: CGA Stand pivot transfer: CGA    GAIT: Gait pattern: step to pattern, decreased arm swing- Right, decreased arm swing- Left, decreased step length- Right, decreased stance time- Left, decreased stride length, trendelenburg, decreased trunk rotation, trunk flexed, and wide  BOS Distance walked: 169f  Assistive device utilized: WEnvironmental consultant- 2 wheeled Level of assistance: CGA    CARDIOVASCULAR RESPONSE: Functional activity: fair  Pre-activity vitals:  HR: 79 SpO2: 96 Post-activity vitals:  HR: 95 SpO2: 97 Modified Borg scale for dyspnea: 1: very mild shortness of breath  CURRENT PROSTHETIC WEAR ASSESSMENT: Patient is independent with: skin check, residual limb care, care of non-amputated limb, prosthetic cleaning, and proper wear schedule/adjustment  Donning prosthesis: Min A Doffing prosthesis: SBA Prosthetic wear tolerance: 8 hours/day, 7 days/week  Residual limb condition: good condition, may still be a little swollen  Prosthetic description: AKA with suction cup attachment  K code/activity level with prosthetic use: Level 2     TODAY'S TREATMENT:   TREATMENT:  PATIENT EDUCATED ON FOLLOWING PROSTHETIC CARE: Prosthetic wear tolerance: 8 hours  hours/day, 7 days/week Prosthetic weight bearing tolerance: 8  hours Other education  Skin check, Residual limb care, Care of non-amputated limb, Prosthetic cleaning, and Proper wear schedule/adjustment    PATIENT EDUCATION: Education details: exam findings, POC, HEP, prosthetic and residual limb care  Person educated: Patient and Spouse Education method: Explanation, Demonstration, and Handouts Education comprehension: needs further education   HOME EXERCISE PROGRAM: [id number] ASSESSMENT:  CLINICAL IMPRESSION: Patient is a 81y.o. female who was seen today for physical therapy evaluation and treatment for prosthetic training s/p AKA. Her residual limb is in great condition, and she did well with functional transfers/bed mobility but did need MinA for prosthetic/shrinker donning/doffing and min guard for gait. Will need a lot of coaching and cuing to improve gait mechanics. Also has very tight hip flexors/weak hip extensors which is definitely limiting gait tolerance in prosthetic. Very  motivated, I think she will do well in PT.    OBJECTIVE IMPAIRMENTS Abnormal gait, decreased activity tolerance, decreased balance, decreased coordination, decreased endurance, decreased knowledge of use of DME, decreased mobility, difficulty walking, decreased ROM, decreased strength, decreased safety awareness, increased fascial restrictions, increased muscle spasms, impaired flexibility, and postural dysfunction.   ACTIVITY LIMITATIONS standing, squatting, stairs, transfers, and locomotion level  PARTICIPATION LIMITATIONS: driving, shopping, community activity, and yard work  PERSONAL FACTORS Time since onset of injury/illness/exacerbation are also  affecting patient's functional outcome.   REHAB POTENTIAL: Good  CLINICAL DECISION MAKING: Evolving/moderate complexity  EVALUATION COMPLEXITY: Moderate   GOALS: Goals reviewed with patient? No  SHORT TERM GOALS: Target date: 08/01/2021    Will be compliant with appropriate HEP  Baseline: Goal status: INITIAL  2.  Will be able to don/doff prosthetic with Mod(I) Baseline:  Goal status: INITIAL  3.  Will be able to perform stand pivot transfers and ambulate household distances with no more than SBA and LRAD  Baseline:  Goal status: INITIAL  4.  Will demonstrate thorough and complete skin check and verbalize appropriate progression of prosthetic wear schedule  Baseline:  Goal status: INITIAL   LONG TERM GOALS: Target date: 08/29/2021    MMT in all weak groups to have improved by one grade  Baseline:  Goal status: INITIAL  2.  Hip flexor mm flexibility/mobility to be no more than 20% limited to help improve gait mechanics  Baseline:  Goal status: INITIAL  3.  Will be able to ambulate at least 281f with LRAD and no more than SBA before fatiguing  Baseline:  Goal status: INITIAL  4.  Will be able to navigate at least 4 steps with LRAD safely with no more than min guard assist  Baseline:  Goal status: INITIAL  5.  Will  be able to ambulate at least 524fover uneven surfaces like grass and gravel with LRAD and no more than min guard assist  Baseline:  Goal status: INITIAL  6.  Will score at least 45 on [place]  Baseline:  Goal status: INITIAL   PLAN: PT FREQUENCY: 2x/week  PT DURATION: 8 weeks  PLANNED INTERVENTIONS: Therapeutic exercises, Therapeutic activity, Neuromuscular re-education, Balance training, Gait training, Patient/Family education, Joint mobilization, Stair training, Prosthetic training, DME instructions, Wheelchair mobility training, Cryotherapy, Moist heat, Taping, Ultrasound, Biofeedback, Manual therapy, and Re-evaluation  PLAN FOR NEXT SESSION: expand HEP, reinforce skin checks and progression of wear time, pre gait tasks, strength, gait training, practice prosthetic management    Calieb Lichtman U PT DPT PN2  07/04/2021, 2:59 PM

## 2021-07-05 ENCOUNTER — Ambulatory Visit: Payer: Medicare Other | Admitting: Family Medicine

## 2021-07-06 NOTE — Therapy (Signed)
OUTPATIENT PHYSICAL THERAPY PROSTHETICS EVALUATION   Patient Name: [name] MRN: [id number] DOB:02-18-1940, 81 y.o., female Today's Date: 07/07/2021  PCP: Arlester Marker  REFERRING PROVIDER: Charlett Blake, MD    PT End of Session - 07/07/21 0847     Visit Number 2    Number of Visits 17    Date for PT Re-Evaluation 08/29/21    Authorization Type [employer]    Authorization Time Period 07/04/21 to 08/29/21    Progress Note Due on Visit 10    PT Start Time 0847    PT Stop Time 0926    PT Time Calculation (min) 39 min    Equipment Utilized During Treatment Other (comment);Gait belt   prosthetic   Activity Tolerance Patient tolerated treatment well    Behavior During Therapy [facility] for tasks assessed/performed              Past Medical History:  Diagnosis Date   Allergy    Anemia    childhood   Anxiety    Aortic stenosis    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   Arthritis    Breast cancer ([place]) 12/05/10   R breast, inv mammary, in situ,ER/PR +,HER2 -   Cancer ([place])    Carcinoma of breast treated with adjuvant chemotherapy ([place])    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[place])    Claudication ([place])    Coronary artery disease    stent 2007   Depression    Diabetes mellitus    Difficulty sleeping    Diverticulosis 12/10/03   Elevated cholesterol    Generalized weakness    GERD (gastroesophageal reflux disease)    Heart murmur    Hepatitis    as an infant   HSV-1 (herpes simplex virus 1) infection    Hyperlipidemia    Hypertension    Hypothyroidism    Osteoarthritis    PAD (peripheral artery disease) ([place])    Personal history of chemotherapy    Personal history of radiation therapy    Pneumonia    2014 september and March 2022   Rheumatoid arthritis ([place])    S/P aortic valve replacement with bioprosthetic valve 05/04/2020   [facility] Inspiris Resilia stented bovine pericardial tissue valve, size 23 mm   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   LIMA to D3, SVG  to PDA, EVH via right thigh   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   Dr [name]   Spinal stenosis    Thyroid disease    Past Surgical History:  Procedure Laterality Date   ABDOMINAL AORTOGRAM W/LOWER EXTREMITY Bilateral 04/29/2019   Procedure: ABDOMINAL AORTOGRAM W/LOWER EXTREMITY;  Surgeon: Marty Heck, MD;  Location: [facility];  Service: Cardiovascular;  Laterality: Bilateral;   ABDOMINAL AORTOGRAM W/LOWER EXTREMITY Left 07/01/2019   Procedure: ABDOMINAL AORTOGRAM W/ Left LOWER EXTREMITY [place];  Surgeon: Marty Heck, MD;  Location: [facility];  Service: Cardiovascular;  Laterality: Left;   ABDOMINAL AORTOGRAM W/LOWER EXTREMITY Left 03/03/2021   Procedure: ABDOMINAL AORTOGRAM W/LOWER EXTREMITY;  Surgeon: Cherre Robins, MD;  Location: [facility];  Service: Cardiovascular;  Laterality: Left;   ABDOMINAL AORTOGRAM W/LOWER EXTREMITY Right 06/12/2021   Procedure: ABDOMINAL AORTOGRAM W/LOWER EXTREMITY;  Surgeon: Waynetta Sandy, MD;  Location: [facility];  Service: Cardiovascular;  Laterality: Right;  LOWER LEG   AMPUTATION Left 03/06/2021   Procedure: LEFT ABOVE KNEE AMPUTATION;  Surgeon: [name], MD;  Location: [facility];  Service: Vascular;  Laterality: Left;   ANTERIOR AND POSTERIOR REPAIR N/A 04/05/2016   Procedure: ANTERIOR (CYSTOCELE) AND POSTERIOR REPAIR (RECTOCELE);  Surgeon: [name], MD;  Location: [facility];  Service: Gynecology;  Laterality: N/A;   AORTIC VALVE REPLACEMENT N/A 05/04/2020   Procedure: AORTIC VALVE REPLACEMENT (AVR) USING INSPIRIS 23MM RESILIA AORTIC VALVE;  Surgeon: Rexene Alberts, MD;  Location: [facility];  Service: Open Heart Surgery;  Laterality: N/A;   BREAST BIOPSY     BREAST LUMPECTOMY  1983   benign biopsy   BREAST LUMPECTOMY Right 12/29/2010   snbx, ER/PR +, Her2 -, 0/1 node pos.   CARPAL TUNNEL RELEASE Bilateral 9/12,8,12   CHOLECYSTECTOMY     COLONOSCOPY N/A 02/09/2013   Procedure:  COLONOSCOPY;  Surgeon: [name], MD;  Location: [facility];  Service: Endoscopy;  Laterality: N/A;   COLONOSCOPY     CORONARY ANGIOPLASTY     CORONARY ARTERY BYPASS GRAFT N/A 05/04/2020   Procedure: CORONARY ARTERY BYPASS GRAFTING (CABG), ON PUMP, TIMES TWO, USING LEFT INTERNAL MAMMARY ARTERY AND RIGHT ENDOSCOPICALLY HARVESTED GREATER SAPHENOUS VEIN;  Surgeon: Rexene Alberts, MD;  Location: [facility];  Service: Open Heart Surgery;  Laterality: N/A;   DILATION AND CURETTAGE OF UTERUS     ESOPHAGOGASTRODUODENOSCOPY N/A 02/09/2013   Procedure: ESOPHAGOGASTRODUODENOSCOPY (EGD);  Surgeon: [name], MD;  Location: [name];  Service: Endoscopy;  Laterality: N/A;   FOOT SPURS     HYSTEROSCOPY WITH D & C N/A 09/11/2012   Procedure: DILATATION AND CURETTAGE ;  Surgeon: [name], MD;  Location: [facility];  Service: Gynecology;  Laterality: N/A;   KNEE SURGERY  2007   LAPAROSCOPIC ASSISTED VAGINAL HYSTERECTOMY N/A 04/05/2016   Procedure: LAPAROSCOPIC ASSISTED VAGINAL HYSTERECTOMY possible BSO;  Surgeon: [name], MD;  Location: [facility];  Service: Gynecology;  Laterality: N/A;   LUMBAR LAMINECTOMY/DECOMPRESSION MICRODISCECTOMY N/A 10/20/2014   Procedure: MICRO LUMBER DECOMPRESSION L3-4 L4-5;  Surgeon: [name], MD;  Location: [facility];  Service: Orthopedics;  Laterality: N/A;   LUMBAR LAMINECTOMY/DECOMPRESSION MICRODISCECTOMY Bilateral 10/13/2015   Procedure: MICRO LUMBAR DECOMPRESSION L5 - S1 AND REDO DECOMPRESSION L4 - L5 AND REMOVAL OF FACET CYST L4 - L5 2 LEVELS;  Surgeon: [name], MD;  Location: [facility];  Service: Orthopedics;  Laterality: Bilateral;   PERIPHERAL VASCULAR ATHERECTOMY Right 06/12/2021   Procedure: PERIPHERAL VASCULAR ATHERECTOMY;  Surgeon: Waynetta Sandy, MD;  Location: [facility];  Service: Cardiovascular;  Laterality: Right;  SFA/POP/AT   PERIPHERAL VASCULAR INTERVENTION Right 04/29/2019   Procedure: PERIPHERAL VASCULAR INTERVENTION;  Surgeon:  Marty Heck, MD;  Location: [facility];  Service: Cardiovascular;  Laterality: Right;   PERIPHERAL VASCULAR INTERVENTION Right 06/12/2021   Procedure: PERIPHERAL VASCULAR INTERVENTION;  Surgeon: Waynetta Sandy, MD;  Location: [facility];  Service: Cardiovascular;  Laterality: Right;  SFP/POPLITEAL/AT   PORT-A-CATH REMOVAL  04/17/2011   Procedure: REMOVAL PORT-A-CATH;  Surgeon: [name], MD;  Location: [facility];  Service: General;  Laterality: Left;   PORTACATH PLACEMENT  02/07/2011   Procedure: INSERTION PORT-A-CATH;  Surgeon: [name], MD;  Location[facility];  Service: General;  Laterality: N/A;   RIGHT/LEFT HEART CATH AND CORONARY ANGIOGRAPHY N/A 03/17/2020   Procedure: RIGHT/LEFT HEART CATH AND CORONARY ANGIOGRAPHY;  Surgeon: Burnell Blanks, MD;  Location: [facility];  Service: Cardiovascular;  Laterality: N/A;   TEE WITHOUT CARDIOVERSION N/A 05/04/2020   Procedure: TRANSESOPHAGEAL ECHOCARDIOGRAM (TEE);  Surgeon: Rexene Alberts, MD;  Location: [facility];  Service: Open Heart Surgery;  Laterality: N/A;   UPPER GASTROINTESTINAL ENDOSCOPY     Patient Active Problem List   Diagnosis Date Noted   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[place]) 03/13/2021   Sinus tachycardia 03/11/2021   Elevated troponin 03/08/2021   Stroke-like symptoms 03/08/2021   Anemia of chronic disease 03/08/2021   Bilateral lower extremity edema 02/21/2021   Acute supraglottitis with epiglottitis 02/05/2021   Epiglottitis 02/05/2021   Reactive airway disease 01/20/2021   Diabetic peripheral neuropathy ([place]) 12/19/2020   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   Primary insomnia 11/02/2020   Irritation of eyelid 07/26/2020   S/P aortic valve replacement with bioprosthetic valve 05/04/2020   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   Peripheral artery disease ([facility])    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[place]) 04/07/2019   Low  back pain 02/03/2019   Scoliosis deformity of spine 02/03/2019   Osteoarthritis of hip 11/17/2018   Claudication ([place])    Chronic headaches 11/12/2017   Degeneration of lumbar intervertebral disc 06/13/2017   Hyperkalemia 06/03/2017   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[place])    Mass of left adrenal gland ([place]) 04/19/2017   Spinal stenosis of lumbar region 10/20/2014   Hyperlipidemia 03/16/2013   Dense breasts 12/22/2012   Anxiety state 04/16/2012   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/16/2012   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   Osteoarthritis of knee 04/16/2012   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 female breast ([place]) 12/20/2010   History of breast cancer 12/05/2010   Depression 09/14/2008   GERD 09/14/2008   Type 2 diabetes mellitus with neurologic complication, with long-term current use of insulin ([place]) 05/20/2008   Coronary atherosclerosis 04/17/2007   Hypothyroidism 04/03/2007    ONSET DATE: 05/29/2021   REFERRING DIAG: B58.309M (ICD-10-CM) - 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[place])   THERAPY DIAG:  Difficulty in walking, not elsewhere classified  Other abnormalities of gait and mobility  Unsteadiness on feet  Muscle weakness (generalized)  Rationale for Evaluation and Treatment Rehabilitation  SUBJECTIVE:   SUBJECTIVE STATEMENT: Patient is feeling pretty good today, reports no pain and no falls since last visit. She tried prone extension exercise given last visit and cannot do because it hurts her back, told her to discontinue and try sidelying instead.     Pt accompanied by: significant other  PERTINENT HISTORY: PAD, GERD, CKD, DM with neuropathy, OA and RA, scoliosis, inflammatory polyarthropathy, breast CA, HLD, depression, CABG, Lx surgery, knee surgery    PAIN:  Are you having pain? No   PRECAUTIONS: Fall and Other: AKA   WEIGHT BEARING RESTRICTIONS No  FALLS: Has patient fallen in last 6  months? Yes. Number of falls 2  LIVING ENVIRONMENT: Lives with: lives with their spouse Lives in: House/apartment Home Access: [place]: One level Stairs: No Has following equipment at home: Environmental consultant - 2 wheeled, Wheelchair (manual), Shower bench, bed side commode, Grab bars, and Ramped entry  PLOF: Independent, Independent with basic ADLs, Independent with gait, and Independent with transfers  PATIENT GOALS get up and get going   OBJECTIVE:    COGNITION: Overall cognitive status: Within functional limits for tasks assessed   SENSATION: WFL  MUSCLE LENGTH: Very tight hip flexors B      LOWER EXTREMITY MMT:  MMT Right eval Left eval  Hip flexion 4- 4+  Hip extension 2+ 3-  Hip abduction 4+ 5  Hip adduction    Hip internal rotation    Hip external rotation    Knee flexion 4+   Knee extension 4+   Ankle dorsiflexion 4+   Ankle plantarflexion    Ankle inversion    Ankle eversion    (Blank rows = not tested)  BED MOBILITY:  Sit to supine Modified independence Supine to sit Modified independence Rolling to Right Modified independence Rolling to Left Modified independence  TRANSFERS: Sit to stand: CGA Stand to sit: CGA Stand pivot transfer: CGA    GAIT: Gait pattern: step to pattern, decreased arm swing- Right, decreased arm swing- Left, decreased step length- Right, decreased stance time- Left, decreased stride length, trendelenburg, decreased trunk rotation, trunk flexed, and wide BOS Distance walked: 170f  Assistive device utilized: WEnvironmental consultant- 2 wheeled Level of assistance: CGA    CARDIOVASCULAR RESPONSE: Functional activity: fair  Pre-activity vitals:  HR: 79 SpO2: 96 Post-activity vitals:  HR: 95 SpO2: 97 Modified Borg scale for dyspnea: 1: very mild shortness of breath  CURRENT PROSTHETIC WEAR ASSESSMENT: Patient is independent with: skin check, residual limb care, care of non-amputated limb, prosthetic cleaning, and  proper wear schedule/adjustment  Donning prosthesis: Min A Doffing prosthesis: SBA Prosthetic wear tolerance: 8 hours/day, 7 days/week  Residual limb condition: good condition, may still be a little swollen  Prosthetic description: AKA with suction cup attachment  K code/activity level with prosthetic use: Level 2     TODAY'S TREATMENT:  07/06/21 Prosthetic doning/doffing Weight shifting side to side and forwards Step up taps with 2HHA  Walking in parallel bars  Gait training w/walker  PATIENT EDUCATED ON FOLLOWING PROSTHETIC CARE: Prosthetic wear tolerance: 8 hours  hours/day, 7 days/week Prosthetic weight bearing tolerance: 8  hours Other education  Skin check, Residual limb care, Care of non-amputated limb, Prosthetic cleaning, and Proper wear schedule/adjustment    PATIENT EDUCATION: Education details: exam findings, POC, HEP, prosthetic and residual limb care  Person educated: Patient and Spouse Education method: Explanation, Demonstration, and Handouts Education comprehension: needs further education   HOME EXERCISE PROGRAM: [id number] ASSESSMENT:  CLINICAL IMPRESSION: Patient returns to PT in good spirits and reports no pain or falls. Todays session focused on donning and doffing prosthetic, educating about skin checks and cleaning incision. Also worked on pregait tasks to learn to bear weight through LLE. Patient is able to walk 2 laps with rolling walker and minA with wheelchair follow. With practice she is showing improvements in step length and balance.   OBJECTIVE IMPAIRMENTS Abnormal gait, decreased activity tolerance, decreased balance, decreased coordination, decreased endurance, decreased knowledge of use of DME, decreased mobility, difficulty walking, decreased ROM, decreased strength, decreased safety awareness, increased fascial restrictions, increased muscle spasms, impaired flexibility, and postural dysfunction.   ACTIVITY LIMITATIONS standing,  squatting, stairs, transfers, and locomotion level  PARTICIPATION LIMITATIONS: driving, shopping, community activity, and yard work  PERSONAL FACTORS Time since onset of injury/illness/exacerbation are also affecting patient's functional outcome.   REHAB POTENTIAL: Good  CLINICAL DECISION MAKING: Evolving/moderate complexity  EVALUATION COMPLEXITY: Moderate   GOALS: Goals reviewed with patient? No  SHORT TERM GOALS: Target date: 08/04/2021    Will be compliant with appropriate HEP  Baseline: Goal status: INITIAL  2.  Will be able to don/doff prosthetic with Mod(I) Baseline:  Goal status: INITIAL  3.  Will be able to perform stand pivot transfers and ambulate household distances with no more than SBA and LRAD  Baseline:  Goal status: INITIAL  4.  Will demonstrate thorough and complete skin check and verbalize appropriate progression of prosthetic wear schedule  Baseline:  Goal status: INITIAL   LONG TERM GOALS: Target date: 09/01/2021    MMT in all weak groups to have improved by one grade  Baseline:  Goal status: INITIAL  2.  Hip flexor mm flexibility/mobility to be no more than 20% limited to help improve gait mechanics  Baseline:  Goal status: INITIAL  3.  Will be able to ambulate at least 277f with LRAD and no more than SBA before fatiguing  Baseline:  Goal status: INITIAL  4.  Will be able to navigate at least 4 steps with LRAD safely with no more than min guard assist  Baseline:  Goal status: INITIAL  5.  Will be able to ambulate at least 551fover uneven surfaces like grass and gravel with LRAD and no more than min guard assist  Baseline:  Goal status: INITIAL  6.  Will score at least 45 on [place]  Baseline:  Goal status: INITIAL   PLAN: PT FREQUENCY: 2x/week  PT DURATION: 8 weeks  PLANNED INTERVENTIONS: Therapeutic exercises, Therapeutic activity, Neuromuscular re-education, Balance training, Gait training, Patient/Family education, Joint  mobilization, Stair training, Prosthetic training, DME instructions, Wheelchair mobility training, Cryotherapy, Moist heat, Taping, Ultrasound, Biofeedback, Manual therapy, and Re-evaluation  PLAN FOR NEXT SESSION: expand HEP, reinforce skin checks and progression of wear time, pre gait tasks, strength, gait training, practice prosthetic management    MoAndris Baumann6/16/2023, 10:17 AM

## 2021-07-07 ENCOUNTER — Ambulatory Visit: Payer: Medicare Other

## 2021-07-07 DIAGNOSIS — M6281 Muscle weakness (generalized): Secondary | ICD-10-CM

## 2021-07-07 DIAGNOSIS — R262 Difficulty in walking, not elsewhere classified: Secondary | ICD-10-CM

## 2021-07-07 DIAGNOSIS — R2689 Other abnormalities of gait and mobility: Secondary | ICD-10-CM

## 2021-07-07 DIAGNOSIS — R2681 Unsteadiness on feet: Secondary | ICD-10-CM

## 2021-07-10 ENCOUNTER — Ambulatory Visit: Payer: Medicare Other

## 2021-07-10 DIAGNOSIS — M6281 Muscle weakness (generalized): Secondary | ICD-10-CM

## 2021-07-10 DIAGNOSIS — R262 Difficulty in walking, not elsewhere classified: Secondary | ICD-10-CM | POA: Diagnosis not present

## 2021-07-10 DIAGNOSIS — R2689 Other abnormalities of gait and mobility: Secondary | ICD-10-CM

## 2021-07-10 DIAGNOSIS — R2681 Unsteadiness on feet: Secondary | ICD-10-CM

## 2021-07-10 NOTE — Therapy (Signed)
OUTPATIENT PHYSICAL THERAPY PROSTHETICS TREATMENT   Patient Name: [name] MRN: [id number] DOB:[birth year], 81 y.o., female Today's Date: 07/10/2021  PCP: Arlester Marker  REFERRING PROVIDER: Charlett Blake, MD    PT End of Session - 07/10/21 1016     Visit Number 3    Number of Visits 17    Date for PT Re-Evaluation 08/29/21    Authorization Type [employer]    Authorization Time Period 07/04/21 to 08/29/21    Progress Note Due on Visit 10    PT Start Time 1016    PT Stop Time 1058    PT Time Calculation (min) 42 min    Equipment Utilized During Treatment Other (comment);Gait belt   prosthetic   Activity Tolerance Patient tolerated treatment well    Behavior During Therapy WFL for tasks assessed/performed               Past Medical History:  Diagnosis Date   Allergy    Anemia    childhood   Anxiety    Aortic stenosis    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   Arthritis    Breast cancer ([place]) 12/05/10   R breast, inv mammary, in situ,ER/PR +,HER2 -   Cancer ([place])    Carcinoma of breast treated with adjuvant chemotherapy ([place])    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[place])    Claudication ([place])    Coronary artery disease    stent 2007   Depression    Diabetes mellitus    Difficulty sleeping    Diverticulosis 12/10/03   Elevated cholesterol    Generalized weakness    GERD (gastroesophageal reflux disease)    Heart murmur    Hepatitis    as an infant   HSV-1 (herpes simplex virus 1) infection    Hyperlipidemia    Hypertension    Hypothyroidism    Osteoarthritis    PAD (peripheral artery disease) ([place])    Personal history of chemotherapy    Personal history of radiation therapy    Pneumonia    2014 september and March 2022   Rheumatoid arthritis ([place])    S/P aortic valve replacement with bioprosthetic valve 05/04/2020   [facility] Inspiris Resilia stented bovine pericardial tissue valve, size 23 mm   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   LIMA to D3, SVG  to PDA, EVH via right thigh   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   Dr [name]   Spinal stenosis    Thyroid disease    Past Surgical History:  Procedure Laterality Date   ABDOMINAL AORTOGRAM W/LOWER EXTREMITY Bilateral 04/29/2019   Procedure: ABDOMINAL AORTOGRAM W/LOWER EXTREMITY;  Surgeon: Marty Heck, MD;  Location: [facility];  Service: Cardiovascular;  Laterality: Bilateral;   ABDOMINAL AORTOGRAM W/LOWER EXTREMITY Left 07/01/2019   Procedure: ABDOMINAL AORTOGRAM W/ Left LOWER EXTREMITY [place];  Surgeon: Marty Heck, MD;  Location: [facility];  Service: Cardiovascular;  Laterality: Left;   ABDOMINAL AORTOGRAM W/LOWER EXTREMITY Left 03/03/2021   Procedure: ABDOMINAL AORTOGRAM W/LOWER EXTREMITY;  Surgeon: Cherre Robins, MD;  Location: [facility];  Service: Cardiovascular;  Laterality: Left;   ABDOMINAL AORTOGRAM W/LOWER EXTREMITY Right 06/12/2021   Procedure: ABDOMINAL AORTOGRAM W/LOWER EXTREMITY;  Surgeon: Waynetta Sandy, MD;  Location: [facility];  Service: Cardiovascular;  Laterality: Right;  LOWER LEG   AMPUTATION Left 03/06/2021   Procedure: LEFT ABOVE KNEE AMPUTATION;  Surgeon: [name], MD;  Location: [facility];  Service: Vascular;  Laterality: Left;   ANTERIOR AND POSTERIOR REPAIR N/A 04/05/2016   Procedure: ANTERIOR (CYSTOCELE) AND POSTERIOR REPAIR (RECTOCELE);  Surgeon: [name], MD;  Location: [facility];  Service: Gynecology;  Laterality: N/A;   AORTIC VALVE REPLACEMENT N/A 05/04/2020   Procedure: AORTIC VALVE REPLACEMENT (AVR) USING INSPIRIS 23MM RESILIA AORTIC VALVE;  Surgeon: Rexene Alberts, MD;  Location: [facility];  Service: Open Heart Surgery;  Laterality: N/A;   BREAST BIOPSY     BREAST LUMPECTOMY  1983   benign biopsy   BREAST LUMPECTOMY Right 12/29/2010   snbx, ER/PR +, Her2 -, 0/1 node pos.   CARPAL TUNNEL RELEASE Bilateral 9/12,8,12   CHOLECYSTECTOMY     COLONOSCOPY N/A 02/09/2013   Procedure:  COLONOSCOPY;  Surgeon: [name], MD;  Location: [facility];  Service: Endoscopy;  Laterality: N/A;   COLONOSCOPY     CORONARY ANGIOPLASTY     CORONARY ARTERY BYPASS GRAFT N/A 05/04/2020   Procedure: CORONARY ARTERY BYPASS GRAFTING (CABG), ON PUMP, TIMES TWO, USING LEFT INTERNAL MAMMARY ARTERY AND RIGHT ENDOSCOPICALLY HARVESTED GREATER SAPHENOUS VEIN;  Surgeon: Rexene Alberts, MD;  Location: [facility];  Service: Open Heart Surgery;  Laterality: N/A;   DILATION AND CURETTAGE OF UTERUS     ESOPHAGOGASTRODUODENOSCOPY N/A 02/09/2013   Procedure: ESOPHAGOGASTRODUODENOSCOPY (EGD);  Surgeon: [name], MD;  Location: [name];  Service: Endoscopy;  Laterality: N/A;   FOOT SPURS     HYSTEROSCOPY WITH D & C N/A 09/11/2012   Procedure: DILATATION AND CURETTAGE ;  Surgeon: [name], MD;  Location: [facility];  Service: Gynecology;  Laterality: N/A;   KNEE SURGERY  2007   LAPAROSCOPIC ASSISTED VAGINAL HYSTERECTOMY N/A 04/05/2016   Procedure: LAPAROSCOPIC ASSISTED VAGINAL HYSTERECTOMY possible BSO;  Surgeon: [name], MD;  Location: [facility];  Service: Gynecology;  Laterality: N/A;   LUMBAR LAMINECTOMY/DECOMPRESSION MICRODISCECTOMY N/A 10/20/2014   Procedure: MICRO LUMBER DECOMPRESSION L3-4 L4-5;  Surgeon: [name], MD;  Location: [facility];  Service: Orthopedics;  Laterality: N/A;   LUMBAR LAMINECTOMY/DECOMPRESSION MICRODISCECTOMY Bilateral 10/13/2015   Procedure: MICRO LUMBAR DECOMPRESSION L5 - S1 AND REDO DECOMPRESSION L4 - L5 AND REMOVAL OF FACET CYST L4 - L5 2 LEVELS;  Surgeon: [name], MD;  Location: [facility];  Service: Orthopedics;  Laterality: Bilateral;   PERIPHERAL VASCULAR ATHERECTOMY Right 06/12/2021   Procedure: PERIPHERAL VASCULAR ATHERECTOMY;  Surgeon: Waynetta Sandy, MD;  Location: [facility];  Service: Cardiovascular;  Laterality: Right;  SFA/POP/AT   PERIPHERAL VASCULAR INTERVENTION Right 04/29/2019   Procedure: PERIPHERAL VASCULAR INTERVENTION;  Surgeon:  Marty Heck, MD;  Location: [facility];  Service: Cardiovascular;  Laterality: Right;   PERIPHERAL VASCULAR INTERVENTION Right 06/12/2021   Procedure: PERIPHERAL VASCULAR INTERVENTION;  Surgeon: Waynetta Sandy, MD;  Location: [facility];  Service: Cardiovascular;  Laterality: Right;  SFP/POPLITEAL/AT   PORT-A-CATH REMOVAL  04/17/2011   Procedure: REMOVAL PORT-A-CATH;  Surgeon: [name], MD;  Location: [facility];  Service: General;  Laterality: Left;   PORTACATH PLACEMENT  02/07/2011   Procedure: INSERTION PORT-A-CATH;  Surgeon: [name], MD;  Location: [facility];  Service: General;  Laterality: N/A;   RIGHT/LEFT HEART CATH AND CORONARY ANGIOGRAPHY N/A 03/17/2020   Procedure: RIGHT/LEFT HEART CATH AND CORONARY ANGIOGRAPHY;  Surgeon: Burnell Blanks, MD;  Location: [facility];  Service: Cardiovascular;  Laterality: N/A;   TEE WITHOUT CARDIOVERSION N/A 05/04/2020   Procedure: TRANSESOPHAGEAL ECHOCARDIOGRAM (TEE);  Surgeon: Rexene Alberts, MD;  Location: [facility];  Service: Open Heart Surgery;  Laterality: N/A;   UPPER GASTROINTESTINAL ENDOSCOPY     Patient Active Problem List   Diagnosis Date Noted   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[place]) 03/13/2021   Sinus tachycardia 03/11/2021   Elevated troponin 03/08/2021   Stroke-like symptoms 03/08/2021   Anemia of chronic disease 03/08/2021   Bilateral lower extremity edema 02/21/2021   Acute supraglottitis with epiglottitis 02/05/2021   Epiglottitis 02/05/2021   Reactive airway disease 01/20/2021   Diabetic peripheral neuropathy ([place]) 12/19/2020   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   Primary insomnia 11/02/2020   Irritation of eyelid 07/26/2020   S/P aortic valve replacement with bioprosthetic valve 05/04/2020   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   Peripheral artery disease ([facility])    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[place]) 04/07/2019   Low  back pain 02/03/2019   Scoliosis deformity of spine 02/03/2019   Osteoarthritis of hip 11/17/2018   Claudication ([place])    Chronic headaches 11/12/2017   Degeneration of lumbar intervertebral disc 06/13/2017   Hyperkalemia 06/03/2017   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[place])    Mass of left adrenal gland ([place]) 04/19/2017   Spinal stenosis of lumbar region 10/20/2014   Hyperlipidemia 03/16/2013   Dense breasts 12/22/2012   Anxiety state 04/16/2012   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/16/2012   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   Osteoarthritis of knee 04/16/2012   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 female breast ([place]) 12/20/2010   History of breast cancer 12/05/2010   Depression 09/14/2008   GERD 09/14/2008   Type 2 diabetes mellitus with neurologic complication, with long-term current use of insulin ([place]) 05/20/2008   Coronary atherosclerosis 04/17/2007   Hypothyroidism 04/03/2007    ONSET DATE: 05/29/2021   REFERRING DIAG: E09.233A (ICD-10-CM) - 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[place])   THERAPY DIAG:  Difficulty in walking, not elsewhere classified  Other abnormalities of gait and mobility  Unsteadiness on feet  Muscle weakness (generalized)  Rationale for Evaluation and Treatment Rehabilitation  SUBJECTIVE:   SUBJECTIVE STATEMENT: Patient is doing alright, pinched her L hand in the back of prosthetic yesterday. Reports no pain and no falls since last visit. Is having trouble sleeping because of back pain.     Pt accompanied by: significant other  PERTINENT HISTORY: PAD, GERD, CKD, DM with neuropathy, OA and RA, scoliosis, inflammatory polyarthropathy, breast CA, HLD, depression, CABG, Lx surgery, knee surgery    PAIN:  Are you having pain? No   PRECAUTIONS: Fall and Other: AKA   WEIGHT BEARING RESTRICTIONS No  FALLS: Has patient fallen in last 6 months? Yes. Number of falls 2  LIVING  ENVIRONMENT: Lives with: lives with their spouse Lives in: House/apartment Home Access: [place]: One level Stairs: No Has following equipment at home: Environmental consultant - 2 wheeled, Wheelchair (manual), Shower bench, bed side commode, Grab bars, and Ramped entry  PLOF: Independent, Independent with basic ADLs, Independent with gait, and Independent with transfers  PATIENT GOALS get up and get going   OBJECTIVE:    COGNITION: Overall cognitive status: Within functional limits for tasks assessed   SENSATION: WFL  MUSCLE LENGTH: Very tight hip flexors B      LOWER EXTREMITY MMT:  MMT Right eval Left eval  Hip flexion 4- 4+  Hip extension 2+ 3-  Hip abduction 4+ 5  Hip adduction  Hip internal rotation    Hip external rotation    Knee flexion 4+   Knee extension 4+   Ankle dorsiflexion 4+   Ankle plantarflexion    Ankle inversion    Ankle eversion    (Blank rows = not tested)  BED MOBILITY:  Sit to supine Modified independence Supine to sit Modified independence Rolling to Right Modified independence Rolling to Left Modified independence  TRANSFERS: Sit to stand: CGA Stand to sit: CGA Stand pivot transfer: CGA    GAIT: Gait pattern: step to pattern, decreased arm swing- Right, decreased arm swing- Left, decreased step length- Right, decreased stance time- Left, decreased stride length, trendelenburg, decreased trunk rotation, trunk flexed, and wide BOS Distance walked: 157f  Assistive device utilized: WEnvironmental consultant- 2 wheeled Level of assistance: CGA    CARDIOVASCULAR RESPONSE: Functional activity: fair  Pre-activity vitals:  HR: 79 SpO2: 96 Post-activity vitals:  HR: 95 SpO2: 97 Modified Borg scale for dyspnea: 1: very mild shortness of breath  CURRENT PROSTHETIC WEAR ASSESSMENT: Patient is independent with: skin check, residual limb care, care of non-amputated limb, prosthetic cleaning, and proper wear schedule/adjustment  Donning  prosthesis: Min A Doffing prosthesis: SBA Prosthetic wear tolerance: 8 hours/day, 7 days/week  Residual limb condition: good condition, may still be a little swollen  Prosthetic description: AKA with suction cup attachment  K code/activity level with prosthetic use: Level 2     TODAY'S TREATMENT:  07/10/21 Prosthetic donning/doffing  Prone hip flexor stretch  Sidelying hip abd 2x10 Supine bridges 2x10 STS on elevated table  x10 Weight shifts in // bars  Side steps  Walking w/RW   07/06/21 Prosthetic doning/doffing Weight shifting side to side and forwards Step up taps with 2HHA  Walking in parallel bars  Gait training w/walker  PATIENT EDUCATED ON FOLLOWING PROSTHETIC CARE: Prosthetic wear tolerance: 8 hours  hours/day, 7 days/week Prosthetic weight bearing tolerance: 8  hours Other education  Skin check, Residual limb care, Care of non-amputated limb, Prosthetic cleaning, and Proper wear schedule/adjustment    PATIENT EDUCATION: Education details: exam findings, POC, HEP, prosthetic and residual limb care  Person educated: Patient and Spouse Education method: Explanation, Demonstration, and Handouts Education comprehension: needs further education   HOME EXERCISE PROGRAM: [id number] ASSESSMENT:  CLINICAL IMPRESSION: Patient returns to PT with injury on L hand from getting caught in prosthetic but otherwise is doing well.  Todays session focused on donning and doffing prosthetic, and bed level exercises as well as gait training. She requires tactile cues with sidelying abd to keep hips from rolling. Attempted STS from elevated table with no HHA, she requires CGA due to leaning on to R side and verbal cues to try to distribute weight equally between legs. Able to walk 1 lap with RW, minA due to occasional buckling.      OBJECTIVE IMPAIRMENTS Abnormal gait, decreased activity tolerance, decreased balance, decreased coordination, decreased endurance, decreased  knowledge of use of DME, decreased mobility, difficulty walking, decreased ROM, decreased strength, decreased safety awareness, increased fascial restrictions, increased muscle spasms, impaired flexibility, and postural dysfunction.   ACTIVITY LIMITATIONS standing, squatting, stairs, transfers, and locomotion level  PARTICIPATION LIMITATIONS: driving, shopping, community activity, and yard work  PERSONAL FACTORS Time since onset of injury/illness/exacerbation are also affecting patient's functional outcome.   REHAB POTENTIAL: Good  CLINICAL DECISION MAKING: Evolving/moderate complexity  EVALUATION COMPLEXITY: Moderate   GOALS: Goals reviewed with patient? No  SHORT TERM GOALS: Target date: 08/07/2021    Will be compliant with  appropriate HEP  Baseline: Goal status: INITIAL  2.  Will be able to don/doff prosthetic with Mod(I) Baseline:  Goal status: INITIAL  3.  Will be able to perform stand pivot transfers and ambulate household distances with no more than SBA and LRAD  Baseline:  Goal status: INITIAL  4.  Will demonstrate thorough and complete skin check and verbalize appropriate progression of prosthetic wear schedule  Baseline:  Goal status: INITIAL   LONG TERM GOALS: Target date: 09/04/2021    MMT in all weak groups to have improved by one grade  Baseline:  Goal status: INITIAL  2.  Hip flexor mm flexibility/mobility to be no more than 20% limited to help improve gait mechanics  Baseline:  Goal status: INITIAL  3.  Will be able to ambulate at least 219f with LRAD and no more than SBA before fatiguing  Baseline:  Goal status: INITIAL  4.  Will be able to navigate at least 4 steps with LRAD safely with no more than min guard assist  Baseline:  Goal status: INITIAL  5.  Will be able to ambulate at least 563fover uneven surfaces like grass and gravel with LRAD and no more than min guard assist  Baseline:  Goal status: INITIAL  6.  Will score at least 45 on  [place]  Baseline:  Goal status: INITIAL   PLAN: PT FREQUENCY: 2x/week  PT DURATION: 8 weeks  PLANNED INTERVENTIONS: Therapeutic exercises, Therapeutic activity, Neuromuscular re-education, Balance training, Gait training, Patient/Family education, Joint mobilization, Stair training, Prosthetic training, DME instructions, Wheelchair mobility training, Cryotherapy, Moist heat, Taping, Ultrasound, Biofeedback, Manual therapy, and Re-evaluation  PLAN FOR NEXT SESSION: expand HEP, reinforce skin checks and progression of wear time, pre gait tasks, strength, gait training, practice prosthetic management    MoAndris Baumann6/19/2023, 11:02 AM

## 2021-07-11 ENCOUNTER — Other Ambulatory Visit: Payer: Self-pay

## 2021-07-11 DIAGNOSIS — E1142 Type 2 diabetes mellitus with diabetic polyneuropathy: Secondary | ICD-10-CM

## 2021-07-11 MED ORDER — SEMAGLUTIDE(0.25 OR 0.5MG/DOS) 2 MG/1.5ML ~~LOC~~ SOPN: 0.5000 mg | PEN_INJECTOR | SUBCUTANEOUS | 6 refills | Status: DC

## 2021-07-11 NOTE — Telephone Encounter (Signed)
Refill request for Ozempic 0.25 mg or 0.'5mg'$  /dose pen.   Per [facility], patient needs a new RX due to Qzempic comes in quanities of 3 ml.   Please review and advise.  Thanks. Dm/cma

## 2021-07-13 ENCOUNTER — Ambulatory Visit: Payer: Medicare Other | Admitting: Physical Therapy

## 2021-07-13 ENCOUNTER — Encounter: Payer: Self-pay | Admitting: Physical Therapy

## 2021-07-13 DIAGNOSIS — R2681 Unsteadiness on feet: Secondary | ICD-10-CM

## 2021-07-13 DIAGNOSIS — R262 Difficulty in walking, not elsewhere classified: Secondary | ICD-10-CM

## 2021-07-13 DIAGNOSIS — M6281 Muscle weakness (generalized): Secondary | ICD-10-CM

## 2021-07-13 DIAGNOSIS — R2689 Other abnormalities of gait and mobility: Secondary | ICD-10-CM

## 2021-07-13 NOTE — Therapy (Signed)
OUTPATIENT PHYSICAL THERAPY PROSTHETICS TREATMENT   Patient Name: [name] MRN: [id number] DOB:10-19-1940, 81 y.o., female Today's Date: 07/13/2021  PCP: Arlester Marker  REFERRING PROVIDER: Charlett Blake, MD    PT End of Session - 07/13/21 0944     Visit Number 4    Number of Visits 17    Date for PT Re-Evaluation 08/29/21    Authorization Type U[employer]    Authorization Time Period 07/04/21 to 08/29/21    Progress Note Due on Visit 10    PT Start Time 0932    PT Stop Time 1012    PT Time Calculation (min) 40 min    Activity Tolerance Patient tolerated treatment well    Behavior During Therapy [facility] for tasks assessed/performed                Past Medical History:  Diagnosis Date   Allergy    Anemia    childhood   Anxiety    Aortic stenosis    Arterial stenosis ([place])    mesenteric   Arthritis    Breast cancer ([place]) 12/05/10   R breast, inv mammary, in situ,ER/PR +,HER2 -   Cancer ([place])    Carcinoma of breast treated with adjuvant chemotherapy ([place])    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[place])    Claudication ([place])    Coronary artery disease    stent 2007   Depression    Diabetes mellitus    Difficulty sleeping    Diverticulosis 12/10/03   Elevated cholesterol    Generalized weakness    GERD (gastroesophageal reflux disease)    Heart murmur    Hepatitis    as an infant   HSV-1 (herpes simplex virus 1) infection    Hyperlipidemia    Hypertension    Hypothyroidism    Osteoarthritis    PAD (peripheral artery disease) ([place])    Personal history of chemotherapy    Personal history of radiation therapy    Pneumonia    2014 september and March 2022   Rheumatoid arthritis ([place])    S/P aortic valve replacement with bioprosthetic valve 05/04/2020   [facility] Inspiris Resilia stented bovine pericardial tissue valve, size 23 mm   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   LIMA to D3, SVG to PDA, EVH via right thigh   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   Dr  [name]   Spinal stenosis    Thyroid disease    Past Surgical History:  Procedure Laterality Date   ABDOMINAL AORTOGRAM W/LOWER EXTREMITY Bilateral 04/29/2019   Procedure: ABDOMINAL AORTOGRAM W/LOWER EXTREMITY;  Surgeon: Marty Heck, MD;  Location: [place] [facility];  Service: Cardiovascular;  Laterality: Bilateral;   ABDOMINAL AORTOGRAM W/LOWER EXTREMITY Left 07/01/2019   Procedure: ABDOMINAL AORTOGRAM W/ Left LOWER EXTREMITY [place];  Surgeon: Marty Heck, MD;  Location: [place] [facility];  Service: Cardiovascular;  Laterality: Left;   ABDOMINAL AORTOGRAM W/LOWER EXTREMITY Left 03/03/2021   Procedure: ABDOMINAL AORTOGRAM W/LOWER EXTREMITY;  Surgeon: Cherre Robins, MD;  Location: [place] [facility];  Service: Cardiovascular;  Laterality: Left;   ABDOMINAL AORTOGRAM W/LOWER EXTREMITY Right 06/12/2021   Procedure: ABDOMINAL AORTOGRAM W/LOWER EXTREMITY;  Surgeon: Waynetta Sandy, MD;  Location: [place] [facility];  Service: Cardiovascular;  Laterality: Right;  LOWER LEG   AMPUTATION Left 03/06/2021   Procedure: LEFT ABOVE KNEE AMPUTATION;  Surgeon: [name], MD;  Location: [place];  Service: Vascular;  Laterality: Left;  ANTERIOR AND POSTERIOR REPAIR N/A 04/05/2016   Procedure: ANTERIOR (CYSTOCELE) AND POSTERIOR REPAIR (RECTOCELE);  Surgeon: [name], MD;  Location: [place] [facility];  Service: Gynecology;  Laterality: N/A;   AORTIC VALVE REPLACEMENT N/A 05/04/2020   Procedure: AORTIC VALVE REPLACEMENT (AVR) USING INSPIRIS 23MM RESILIA AORTIC VALVE;  Surgeon: Rexene Alberts, MD;  Location: [place];  Service: Open Heart Surgery;  Laterality: N/A;   BREAST BIOPSY     BREAST LUMPECTOMY  1983   benign biopsy   BREAST LUMPECTOMY Right 12/29/2010   snbx, ER/PR +, Her2 -, 0/1 node pos.   CARPAL TUNNEL RELEASE Bilateral 9/12,8,12   CHOLECYSTECTOMY     COLONOSCOPY N/A 02/09/2013   Procedure: COLONOSCOPY;  Surgeon: [name], MD;  [facility]: [facility];  Service:  Endoscopy;  Laterality: N/A;   COLONOSCOPY     CORONARY ANGIOPLASTY     CORONARY ARTERY BYPASS GRAFT N/A 05/04/2020   Procedure: CORONARY ARTERY BYPASS GRAFTING (CABG), ON PUMP, TIMES TWO, USING LEFT INTERNAL MAMMARY ARTERY AND RIGHT ENDOSCOPICALLY HARVESTED GREATER SAPHENOUS VEIN;  Surgeon: Rexene Alberts, MD;  Location: [place];  Service: Open Heart Surgery;  Laterality: N/A;   DILATION AND CURETTAGE OF UTERUS     ESOPHAGOGASTRODUODENOSCOPY N/A 02/09/2013   Procedure: ESOPHAGOGASTRODUODENOSCOPY (EGD);  Surgeon: [name], MD;  Location: [name];  Service: Endoscopy;  Laterality: N/A;   FOOT SPURS     HYSTEROSCOPY WITH D & C N/A 09/11/2012   Procedure: DILATATION AND CURETTAGE ;  Surgeon: [name], MD;  Location: [place] [facility];  Service: Gynecology;  Laterality: N/A;   KNEE SURGERY  2007   LAPAROSCOPIC ASSISTED VAGINAL HYSTERECTOMY N/A 04/05/2016   Procedure: LAPAROSCOPIC ASSISTED VAGINAL HYSTERECTOMY possible BSO;  Surgeon: [name], MD;  Location: [place] [facility];  Service: Gynecology;  Laterality: N/A;   LUMBAR LAMINECTOMY/DECOMPRESSION MICRODISCECTOMY N/A 10/20/2014   Procedure: MICRO LUMBER DECOMPRESSION L3-4 L4-5;  Surgeon: [name], MD;  [facility];  Service: Orthopedics;  Laterality: N/A;   LUMBAR LAMINECTOMY/DECOMPRESSION MICRODISCECTOMY Bilateral 10/13/2015   Procedure: MICRO LUMBAR DECOMPRESSION L5 - S1 AND REDO DECOMPRESSION L4 - L5 AND REMOVAL OF FACET CYST L4 - L5 2 LEVELS;  Surgeon: [name], MD;  [facility];  Service: Orthopedics;  Laterality: Bilateral;   PERIPHERAL VASCULAR ATHERECTOMY Right 06/12/2021   Procedure: PERIPHERAL VASCULAR ATHERECTOMY;  Surgeon: Waynetta Sandy, MD;  Location: [place] [facility];  Service: Cardiovascular;  Laterality: Right;  SFA/POP/AT   PERIPHERAL VASCULAR INTERVENTION Right 04/29/2019   Procedure: PERIPHERAL VASCULAR INTERVENTION;  Surgeon: Marty Heck, MD;  Location: [place] [facility];  Service:  Cardiovascular;  Laterality: Right;   PERIPHERAL VASCULAR INTERVENTION Right 06/12/2021   Procedure: PERIPHERAL VASCULAR INTERVENTION;  Surgeon: Waynetta Sandy, MD;  Location: [place] [facility];  Service: Cardiovascular;  Laterality: Right;  SFP/POPLITEAL/AT   PORT-A-CATH REMOVAL  04/17/2011   Procedure: REMOVAL PORT-A-CATH;  Surgeon: [name], MD;  Location: [place];  Service: General;  Laterality: Left;   PORTACATH PLACEMENT  02/07/2011   Procedure: INSERTION PORT-A-CATH;  Surgeon: [name], MD;  [facility];  Service: General;  Laterality: N/A;   RIGHT/LEFT HEART CATH AND CORONARY ANGIOGRAPHY N/A 03/17/2020   Procedure: RIGHT/LEFT HEART CATH AND CORONARY ANGIOGRAPHY;  Surgeon: Burnell Blanks, MD;  Location: [place] [facility];  Service: Cardiovascular;  Laterality: N/A;   TEE WITHOUT CARDIOVERSION N/A 05/04/2020   Procedure: TRANSESOPHAGEAL ECHOCARDIOGRAM (TEE);  Surgeon: Rexene Alberts, MD;  Location: [place];  Service:  Open Heart Surgery;  Laterality: N/A;   UPPER GASTROINTESTINAL ENDOSCOPY     Patient Active Problem List   Diagnosis Date Noted   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[place]) 03/13/2021   Sinus tachycardia 03/11/2021   Elevated troponin 03/08/2021   Stroke-like symptoms 03/08/2021   Anemia of chronic disease 03/08/2021   Bilateral lower extremity edema 02/21/2021   Acute supraglottitis with epiglottitis 02/05/2021   Epiglottitis 02/05/2021   Reactive airway disease 01/20/2021   Diabetic peripheral neuropathy ([place]) 12/19/2020   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   Primary insomnia 11/02/2020   Irritation of eyelid 07/26/2020   S/P aortic valve replacement with bioprosthetic valve 05/04/2020   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   Peripheral artery disease ([facility])    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[place]) 04/07/2019   Low back pain 02/03/2019   Scoliosis deformity of spine 02/03/2019    Osteoarthritis of hip 11/17/2018   Claudication ([place])    Chronic headaches 11/12/2017   Degeneration of lumbar intervertebral disc 06/13/2017   Hyperkalemia 06/03/2017   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[place])    Mass of left adrenal gland ([place]) 04/19/2017   Spinal stenosis of lumbar region 10/20/2014   Hyperlipidemia 03/16/2013   Dense breasts 12/22/2012   Anxiety state 04/16/2012   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/16/2012   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   Osteoarthritis of knee 04/16/2012   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 female breast ([place]) 12/20/2010   History of breast cancer 12/05/2010   Depression 09/14/2008   GERD 09/14/2008   Type 2 diabetes mellitus with neurologic complication, with long-term current use of insulin ([place]) 05/20/2008   Coronary atherosclerosis 04/17/2007   Hypothyroidism 04/03/2007    ONSET DATE: 05/29/2021   REFERRING DIAG: U63.335K (ICD-10-CM) - 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[place])   THERAPY DIAG:  Difficulty in walking, not elsewhere classified  Other abnormalities of gait and mobility  Unsteadiness on feet  Muscle weakness (generalized)  Rationale for Evaluation and Treatment Rehabilitation  SUBJECTIVE:   SUBJECTIVE STATEMENT:   Doing OK, tried doing exercises where I lay on my stomach the stretch is fine but the lifting my leg is hard    Pt accompanied by: significant other  PERTINENT HISTORY: PAD, GERD, CKD, DM with neuropathy, OA and RA, scoliosis, inflammatory polyarthropathy, breast CA, HLD, depression, CABG, Lx surgery, knee surgery    PAIN:  Are you having pain? No   PRECAUTIONS: Fall and Other: AKA   WEIGHT BEARING RESTRICTIONS No  FALLS: Has patient fallen in last 6 months? Yes. Number of falls 2  LIVING ENVIRONMENT: Lives with: lives with their spouse Lives in: House/apartment Home Access: [place]: One level Stairs: No Has  following equipment at home: Environmental consultant - 2 wheeled, Wheelchair (manual), Shower bench, bed side commode, Grab bars, and Ramped entry  PLOF: Independent, Independent with basic ADLs, Independent with gait, and Independent with transfers  PATIENT GOALS get up and get going      TODAY'S TREATMENT:   07/13/21  Prone hip flexor stretch x3 minutes Sidelying hip ABD 2x10 B prosthetic on/MinA  Sidelying L hip extension 2x10  B prosthetic on/MinA  Bridges 1x15   WC and prosthetic management with mobility, safety with mobility- more impulsive with transfers today  Standing hip flexion B 1x10 close min guard cues for locking knee on prosthetic Standing hip ABD B 1x10 close in min guard cues for  locking knee on prosthetic  Backward walking in // bars min guard for safety Cross midline reaches to L over prosthetic 1x10 for increased WB on residual limb  with practice locking/unlocking prosthetic       07/10/21 Prosthetic donning/doffing  Prone hip flexor stretch  Sidelying hip abd 2x10 Supine bridges 2x10 STS on elevated table  x10 Weight shifts in // bars  Side steps  Walking w/RW   07/06/21 Prosthetic doning/doffing Weight shifting side to side and forwards Step up taps with 2HHA  Walking in parallel bars  Gait training w/walker  PATIENT EDUCATED ON FOLLOWING PROSTHETIC CARE: Prosthetic wear tolerance: 8 hours  hours/day, 7 days/week Prosthetic weight bearing tolerance: 8  hours Other education  Skin check, Residual limb care, Care of non-amputated limb, Prosthetic cleaning, and Proper wear schedule/adjustment    PATIENT EDUCATION: Education details: exercise form and purpose, prosthetic management, safety with pivot transfers while wearing prosthetic  Person educated: Patient and Spouse Education method: Explanation, Demonstration, and Handouts Education comprehension: needs further education   HOME EXERCISE PROGRAM: [phone]  ASSESSMENT:  CLINICAL  IMPRESSION:  Continued working on hip flexor flexibility as well as hip abductor and extensor strength, otherwise focused on pregait based tasks at the steps and in the // bars. Does need intermittent cues for safety with general transfers (at one point stood up on foot plate of WC), as well as reminders to make sure she is getting her prosthetic knee joint locked during gait to avoid knee buckling. Will continue to progress as able.   OBJECTIVE IMPAIRMENTS Abnormal gait, decreased activity tolerance, decreased balance, decreased coordination, decreased endurance, decreased knowledge of use of DME, decreased mobility, difficulty walking, decreased ROM, decreased strength, decreased safety awareness, increased fascial restrictions, increased muscle spasms, impaired flexibility, and postural dysfunction.   ACTIVITY LIMITATIONS standing, squatting, stairs, transfers, and locomotion level  PARTICIPATION LIMITATIONS: driving, shopping, community activity, and yard work  PERSONAL FACTORS Time since onset of injury/illness/exacerbation are also affecting patient's functional outcome.   REHAB POTENTIAL: Good  CLINICAL DECISION MAKING: Evolving/moderate complexity  EVALUATION COMPLEXITY: Moderate   GOALS: Goals reviewed with patient? No  SHORT TERM GOALS: Target date: 08/10/2021    Will be compliant with appropriate HEP  Baseline: Goal status: INITIAL  2.  Will be able to don/doff prosthetic with Mod(I) Baseline:  Goal status: INITIAL  3.  Will be able to perform stand pivot transfers and ambulate household distances with no more than SBA and LRAD  Baseline:  Goal status: INITIAL  4.  Will demonstrate thorough and complete skin check and verbalize appropriate progression of prosthetic wear schedule  Baseline:  Goal status: INITIAL   LONG TERM GOALS: Target date: 09/07/2021    MMT in all weak groups to have improved by one grade  Baseline:  Goal status: INITIAL  2.  Hip flexor mm  flexibility/mobility to be no more than 20% limited to help improve gait mechanics  Baseline:  Goal status: INITIAL  3.  Will be able to ambulate at least 237f with LRAD and no more than SBA before fatiguing  Baseline:  Goal status: INITIAL  4.  Will be able to navigate at least 4 steps with LRAD safely with no more than min guard assist  Baseline:  Goal status: INITIAL  5.  Will be able to ambulate at least 541fover uneven surfaces like grass and gravel with LRAD and no more than min guard assist  Baseline:  Goal status: INITIAL  6.  Will score at  least 45 on [place]  Baseline:  Goal status: INITIAL   PLAN: PT FREQUENCY: 2x/week  PT DURATION: 8 weeks  PLANNED INTERVENTIONS: Therapeutic exercises, Therapeutic activity, Neuromuscular re-education, Balance training, Gait training, Patient/Family education, Joint mobilization, Stair training, Prosthetic training, DME instructions, Wheelchair mobility training, Cryotherapy, Moist heat, Taping, Ultrasound, Biofeedback, Manual therapy, and Re-evaluation  PLAN FOR NEXT SESSION: expand HEP, reinforce skin checks and progression of wear time, pre gait tasks, strength, gait training, practice prosthetic management    Jelina Paulsen U PT DPT PN2  07/13/2021, 10:14 AM

## 2021-07-17 ENCOUNTER — Ambulatory Visit: Payer: Medicare Other

## 2021-07-17 DIAGNOSIS — R262 Difficulty in walking, not elsewhere classified: Secondary | ICD-10-CM | POA: Diagnosis not present

## 2021-07-17 DIAGNOSIS — R2689 Other abnormalities of gait and mobility: Secondary | ICD-10-CM

## 2021-07-17 DIAGNOSIS — R2681 Unsteadiness on feet: Secondary | ICD-10-CM

## 2021-07-17 DIAGNOSIS — M6281 Muscle weakness (generalized): Secondary | ICD-10-CM

## 2021-07-20 ENCOUNTER — Ambulatory Visit: Payer: Medicare Other

## 2021-07-20 DIAGNOSIS — R262 Difficulty in walking, not elsewhere classified: Secondary | ICD-10-CM

## 2021-07-20 DIAGNOSIS — R2689 Other abnormalities of gait and mobility: Secondary | ICD-10-CM

## 2021-07-20 DIAGNOSIS — M6281 Muscle weakness (generalized): Secondary | ICD-10-CM

## 2021-07-20 DIAGNOSIS — R2681 Unsteadiness on feet: Secondary | ICD-10-CM

## 2021-07-20 NOTE — Therapy (Signed)
OUTPATIENT PHYSICAL THERAPY PROSTHETICS TREATMENT   Patient Name: [name] MRN: [id number] DOB:02-Mar-1940, 81 y.o., female 52 Date: 07/20/2021  PCP: Arlester Marker  REFERRING PROVIDER: Charlett Blake, MD    PT End of Session - 07/20/21 1140     Visit Number 6    Number of Visits 17    Date for PT Re-Evaluation 08/29/21    Authorization Type U[employer]    Authorization Time Period 07/04/21 to 08/29/21    Progress Note Due on Visit 10    PT Start Time 1140    PT Stop Time 1224    PT Time Calculation (min) 44 min    Activity Tolerance Patient tolerated treatment well    Behavior During Therapy [facility] for tasks assessed/performed                  Past Medical History:  Diagnosis Date   Allergy    Anemia    childhood   Anxiety    Aortic stenosis    Arterial stenosis (HCC)    mesenteric   Arthritis    Breast cancer ([place]) 12/05/10   R breast, inv mammary, in situ,ER/PR +,HER2 -   Cancer ([place])    Carcinoma of breast treated with adjuvant chemotherapy ([place])    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[place])    Claudication ([place])    Coronary artery disease    stent 2007   Depression    Diabetes mellitus    Difficulty sleeping    Diverticulosis 12/10/03   Elevated cholesterol    Generalized weakness    GERD (gastroesophageal reflux disease)    Heart murmur    Hepatitis    as an infant   HSV-1 (herpes simplex virus 1) infection    Hyperlipidemia    Hypertension    Hypothyroidism    Osteoarthritis    PAD (peripheral artery disease) ([place])    Personal history of chemotherapy    Personal history of radiation therapy    Pneumonia    2014 september and March 2022   Rheumatoid arthritis ([place])    S/P aortic valve replacement with bioprosthetic valve 05/04/2020   [place]wards Inspiris Resilia stented bovine pericardial tissue valve, size 23 mm   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   LIMA to D3, SVG to PDA, EVH via right thigh   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   Dr  [name]   Spinal stenosis    Thyroid disease    Past Surgical History:  Procedure Laterality Date   ABDOMINAL AORTOGRAM W/LOWER EXTREMITY Bilateral 04/29/2019   Procedure: ABDOMINAL AORTOGRAM W/LOWER EXTREMITY;  Surgeon: Marty Heck, MD;  Location: [place] [facility];  Service: Cardiovascular;  Laterality: Bilateral;   ABDOMINAL AORTOGRAM W/LOWER EXTREMITY Left 07/01/2019   Procedure: ABDOMINAL AORTOGRAM W/ Left LOWER EXTREMITY [place];  Surgeon: Marty Heck, MD;  Location: [place] [facility];  Service: Cardiovascular;  Laterality: Left;   ABDOMINAL AORTOGRAM W/LOWER EXTREMITY Left 03/03/2021   Procedure: ABDOMINAL AORTOGRAM W/LOWER EXTREMITY;  Surgeon: Cherre Robins, MD;  Location: [place] [facility];  Service: Cardiovascular;  Laterality: Left;   ABDOMINAL AORTOGRAM W/LOWER EXTREMITY Right 06/12/2021   Procedure: ABDOMINAL AORTOGRAM W/LOWER EXTREMITY;  Surgeon: Waynetta Sandy, MD;  Location: [place] [facility];  Service: Cardiovascular;  Laterality: Right;  LOWER LEG   AMPUTATION Left 03/06/2021   Procedure: LEFT ABOVE KNEE AMPUTATION;  Surgeon: [name], MD;  Location: [place];  Service: Vascular;  Laterality:  Left;   ANTERIOR AND POSTERIOR REPAIR N/A 04/05/2016   Procedure: ANTERIOR (CYSTOCELE) AND POSTERIOR REPAIR (RECTOCELE);  Surgeon: [name], MD;  Location: [facility];  Service: Gynecology;  Laterality: N/A;   AORTIC VALVE REPLACEMENT N/A 05/04/2020   Procedure: AORTIC VALVE REPLACEMENT (AVR) USING INSPIRIS 23MM RESILIA AORTIC VALVE;  Surgeon: Rexene Alberts, MD;  Location: [place];  Service: Open Heart Surgery;  Laterality: N/A;   BREAST BIOPSY     BREAST LUMPECTOMY  1983   benign biopsy   BREAST LUMPECTOMY Right 12/29/2010   snbx, ER/PR +, Her2 -, 0/1 node pos.   CARPAL TUNNEL RELEASE Bilateral 9/12,8,12   CHOLECYSTECTOMY     COLONOSCOPY N/A 02/09/2013   Procedure: COLONOSCOPY;  Surgeon: [name], MD;  [facility]: [facility];  Service:  Endoscopy;  Laterality: N/A;   COLONOSCOPY     CORONARY ANGIOPLASTY     CORONARY ARTERY BYPASS GRAFT N/A 05/04/2020   Procedure: CORONARY ARTERY BYPASS GRAFTING (CABG), ON PUMP, TIMES TWO, USING LEFT INTERNAL MAMMARY ARTERY AND RIGHT ENDOSCOPICALLY HARVESTED GREATER SAPHENOUS VEIN;  Surgeon: Rexene Alberts, MD;  Location: [place];  Service: Open Heart Surgery;  Laterality: N/A;   DILATION AND CURETTAGE OF UTERUS     ESOPHAGOGASTRODUODENOSCOPY N/A 02/09/2013   Procedure: ESOPHAGOGASTRODUODENOSCOPY (EGD);  Surgeon: [name], MD;  Location: [name];  Service: Endoscopy;  Laterality: N/A;   FOOT SPURS     HYSTEROSCOPY WITH D & C N/A 09/11/2012   Procedure: DILATATION AND CURETTAGE ;  Surgeon: [name], MD;  Location: [facility];  Service: Gynecology;  Laterality: N/A;   KNEE SURGERY  2007   LAPAROSCOPIC ASSISTED VAGINAL HYSTERECTOMY N/A 04/05/2016   Procedure: LAPAROSCOPIC ASSISTED VAGINAL HYSTERECTOMY possible BSO;  Surgeon: [name], MD;  Location: [facility];  Service: Gynecology;  Laterality: N/A;   LUMBAR LAMINECTOMY/DECOMPRESSION MICRODISCECTOMY N/A 10/20/2014   Procedure: MICRO LUMBER DECOMPRESSION L3-4 L4-5;  Surgeon: [name], MD;  Location: [facility];  Service: Orthopedics;  Laterality: N/A;   LUMBAR LAMINECTOMY/DECOMPRESSION MICRODISCECTOMY Bilateral 10/13/2015   Procedure: MICRO LUMBAR DECOMPRESSION L5 - S1 AND REDO DECOMPRESSION L4 - L5 AND REMOVAL OF FACET CYST L4 - L5 2 LEVELS;  Surgeon: [name], MD;  Location: [facility];  Service: Orthopedics;  Laterality: Bilateral;   PERIPHERAL VASCULAR ATHERECTOMY Right 06/12/2021   Procedure: PERIPHERAL VASCULAR ATHERECTOMY;  Surgeon: Waynetta Sandy, MD;  Location: [place];  Service: Cardiovascular;  Laterality: Right;  SFA/POP/AT   PERIPHERAL VASCULAR INTERVENTION Right 04/29/2019   Procedure: PERIPHERAL VASCULAR INTERVENTION;  Surgeon: Marty Heck, MD;  Location: [place] [facility];  Service:  Cardiovascular;  Laterality: Right;   PERIPHERAL VASCULAR INTERVENTION Right 06/12/2021   Procedure: PERIPHERAL VASCULAR INTERVENTION;  Surgeon: Waynetta Sandy, MD;  Location: [place] [facility];  Service: Cardiovascular;  Laterality: Right;  SFP/POPLITEAL/AT   PORT-A-CATH REMOVAL  04/17/2011   Procedure: REMOVAL PORT-A-CATH;  Surgeon: [name], MD;  Location: [place];  Service: General;  Laterality: Left;   PORTACATH PLACEMENT  02/07/2011   Procedure: INSERTION PORT-A-CATH;  Surgeon: [name], MD;  [facility];  Service: General;  Laterality: N/A;   RIGHT/LEFT HEART CATH AND CORONARY ANGIOGRAPHY N/A 03/17/2020   Procedure: RIGHT/LEFT HEART CATH AND CORONARY ANGIOGRAPHY;  Surgeon: Burnell Blanks, MD;  Location: [place] [facility];  Service: Cardiovascular;  Laterality: N/A;   TEE WITHOUT CARDIOVERSION N/A 05/04/2020   Procedure: TRANSESOPHAGEAL ECHOCARDIOGRAM (TEE);  Surgeon: Rexene Alberts, MD;  [facility];  Service: Open Heart Surgery;  Laterality: N/A;   UPPER GASTROINTESTINAL ENDOSCOPY     Patient Active Problem List   Diagnosis Date Noted   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[place]) 03/13/2021   Sinus tachycardia 03/11/2021   Elevated troponin 03/08/2021   Stroke-like symptoms 03/08/2021   Anemia of chronic disease 03/08/2021   Bilateral lower extremity edema 02/21/2021   Acute supraglottitis with epiglottitis 02/05/2021   Epiglottitis 02/05/2021   Reactive airway disease 01/20/2021   Diabetic peripheral neuropathy ([place]) 12/19/2020   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   Primary insomnia 11/02/2020   Irritation of eyelid 07/26/2020   S/P aortic valve replacement with bioprosthetic valve 05/04/2020   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   Peripheral artery disease (HCC)    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[place]) 04/07/2019   Low back pain 02/03/2019   Scoliosis deformity of spine 02/03/2019    Osteoarthritis of hip 11/17/2018   Claudication ([place])    Chronic headaches 11/12/2017   Degeneration of lumbar intervertebral disc 06/13/2017   Hyperkalemia 06/03/2017   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[place])    Mass of left adrenal gland ([place]) 04/19/2017   Spinal stenosis of lumbar region 10/20/2014   Hyperlipidemia 03/16/2013   Dense breasts 12/22/2012   Anxiety state 04/16/2012   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/16/2012   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   Osteoarthritis of knee 04/16/2012   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 female breast ([place]) 12/20/2010   History of breast cancer 12/05/2010   Depression 09/14/2008   GERD 09/14/2008   Type 2 diabetes mellitus with neurologic complication, with long-term current use of insulin ([place]) 05/20/2008   Coronary atherosclerosis 04/17/2007   Hypothyroidism 04/03/2007    ONSET DATE: 05/29/2021   REFERRING DIAG: Q59.563O (ICD-10-CM) - 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[place])   THERAPY DIAG:  Difficulty in walking, not elsewhere classified  Other abnormalities of gait and mobility  Unsteadiness on feet  Muscle weakness (generalized)  Rationale for Evaluation and Treatment Rehabilitation  SUBJECTIVE:   SUBJECTIVE STATEMENT: Patient states she is doing well. Still getting around in chair, will furniture walk with her counter if she is in her kitchen.   Pt accompanied by: significant other  PERTINENT HISTORY: PAD, GERD, CKD, DM with neuropathy, OA and RA, scoliosis, inflammatory polyarthropathy, breast CA, HLD, depression, CABG, Lx surgery, knee surgery    PAIN:  Are you having pain? No   PRECAUTIONS: Fall and Other: AKA   WEIGHT BEARING RESTRICTIONS No  FALLS: Has patient fallen in last 6 months? Yes. Number of falls 2  LIVING ENVIRONMENT: Lives with: lives with their spouse Lives in: House/apartment Home Access: [place]: One  level Stairs: No Has following equipment at home: Environmental consultant - 2 wheeled, Wheelchair (manual), Shower bench, bed side commode, Grab bars, and Ramped entry  PLOF: Independent, Independent with basic ADLs, Independent with gait, and Independent with transfers  PATIENT GOALS get up and get going      TODAY'S TREATMENT:  07/20/21 Bridges 2x10 Trunk rotations 95mn Sidelying ext 3x10 Sidelying abd 3x10 Step up 6" w/non prosthetic side 2x10 Walking forwards/backwards in // bars Walking w/RW 2laps Nustep L3, 551ms   07/17/21 Prone stretch 57m88m Bridges 2x10 Sidelying hip abd 2x10 minA Sidelying hip ext 2x10 minA STS from elevated table 2x10 Standing hip flexion 2x10 Standing hip abd 2x10 Forwards/Backwards/sideways walking in //   07/13/21  Prone  hip flexor stretch x3 minutes Sidelying hip ABD 2x10 B prosthetic on/MinA  Sidelying L hip extension 2x10  B prosthetic on/MinA  Bridges 1x15   WC and prosthetic management with mobility, safety with mobility- more impulsive with transfers today  Standing hip flexion B 1x10 close min guard cues for locking knee on prosthetic Standing hip ABD B 1x10 close in min guard cues for locking knee on prosthetic  Backward walking in // bars min guard for safety Cross midline reaches to L over prosthetic 1x10 for increased WB on residual limb  with practice locking/unlocking prosthetic     07/10/21 Prosthetic donning/doffing  Prone hip flexor stretch  Sidelying hip abd 2x10 Supine bridges 2x10 STS on elevated table  x10 Weight shifts in // bars  Side steps  Walking w/RW   07/06/21 Prosthetic doning/doffing Weight shifting side to side and forwards Step up taps with 2HHA  Walking in parallel bars  Gait training w/walker  PATIENT EDUCATED ON FOLLOWING PROSTHETIC CARE: Prosthetic wear tolerance: 8 hours  hours/day, 7 days/week Prosthetic weight bearing tolerance: 8  hours Other education  Skin check, Residual limb care, Care of  non-amputated limb, Prosthetic cleaning, and Proper wear schedule/adjustment    PATIENT EDUCATION: Education details: exercise form and purpose, prosthetic management, safety with pivot transfers while wearing prosthetic  Person educated: Patient and Spouse Education method: Explanation, Demonstration, and Handouts Education comprehension: needs further education   HOME EXERCISE PROGRAM: [phone]  ASSESSMENT:  CLINICAL IMPRESSION:  Patient doing better with stand pivot transfers, still needs cues to get leg rest out of the way before standing up. Has better safety awareness with transfers today, is able to correct when pushing out of chair instead of reaching for rails or walker. We continued to work on weight bearing through prosthetic and weight shifting. We attempted some TB exercises in // bars but patient it was too hard to complete and without losing balance. Still having difficulty with walking and trying to normalize new gait pattern. Ended with Nustep to simulate walking effect and pushing through prosthetic.  OBJECTIVE IMPAIRMENTS Abnormal gait, decreased activity tolerance, decreased balance, decreased coordination, decreased endurance, decreased knowledge of use of DME, decreased mobility, difficulty walking, decreased ROM, decreased strength, decreased safety awareness, increased fascial restrictions, increased muscle spasms, impaired flexibility, and postural dysfunction.   ACTIVITY LIMITATIONS standing, squatting, stairs, transfers, and locomotion level  PARTICIPATION LIMITATIONS: driving, shopping, community activity, and yard work  PERSONAL FACTORS Time since onset of injury/illness/exacerbation are also affecting patient's functional outcome.   REHAB POTENTIAL: Good  CLINICAL DECISION MAKING: Evolving/moderate complexity  EVALUATION COMPLEXITY: Moderate   GOALS: Goals reviewed with patient? No  SHORT TERM GOALS: Target date: 08/17/2021    Will be compliant  with appropriate HEP  Baseline: Goal status: INITIAL  2.  Will be able to don/doff prosthetic with Mod(I) Baseline:  Goal status: INITIAL  3.  Will be able to perform stand pivot transfers and ambulate household distances with no more than SBA and LRAD  Baseline:  Goal status: INITIAL  4.  Will demonstrate thorough and complete skin check and verbalize appropriate progression of prosthetic wear schedule  Baseline:  Goal status: INITIAL   LONG TERM GOALS: Target date: 09/14/2021    MMT in all weak groups to have improved by one grade  Baseline:  Goal status: INITIAL  2.  Hip flexor mm flexibility/mobility to be no more than 20% limited to help improve gait mechanics  Baseline:  Goal status: INITIAL  3.  Will  be able to ambulate at least 252f with LRAD and no more than SBA before fatiguing  Baseline:  Goal status: INITIAL  4.  Will be able to navigate at least 4 steps with LRAD safely with no more than min guard assist  Baseline:  Goal status: INITIAL  5.  Will be able to ambulate at least 536fover uneven surfaces like grass and gravel with LRAD and no more than min guard assist  Baseline:  Goal status: INITIAL  6.  Will score at least 45 on [place]  Baseline:  Goal status: INITIAL   PLAN: PT FREQUENCY: 2x/week  PT DURATION: 8 weeks  PLANNED INTERVENTIONS: Therapeutic exercises, Therapeutic activity, Neuromuscular re-education, Balance training, Gait training, Patient/Family education, Joint mobilization, Stair training, Prosthetic training, DME instructions, Wheelchair mobility training, Cryotherapy, Moist heat, Taping, Ultrasound, Biofeedback, Manual therapy, and Re-evaluation  PLAN FOR NEXT SESSION: expand HEP, reinforce skin checks and progression of wear time, pre gait tasks, strength, gait training, practice prosthetic management    MoAndris BaumannDPT 07/20/2021, 12:24 PM

## 2021-07-23 NOTE — Progress Notes (Signed)
Date:  07/28/2021   ID:  [name], [name] [birth year], MRN [id number]  PCP:  [name], MD   [facility] Providers Cardiologist:  Johnsie Cancel  Patient Profile:    [name] is a 81 y.o. female with:  Coronary artery disease  Aortic stenosis S/p DES to RCA in 2007 Myoview 2017 low risk  S/p CABG + AVR 4/22 Hypertension  Diabetes mellitus  Chronic kidney disease  Hyperlipidemia  Breast CA  Peripheral arterial disease  S/p R SFA stent GERD Hypothyroidism  Carotid artery disease  S/p carotid stent in 2007 [place] 4/22: Bilat 1-39  Prior CV studies: Pre-CABG Dopplers 05/02/20 Bilateral ICA 1-39   RIGHT/LEFT HEART CATH AND CORONARY ANGIOGRAPHY 03/17/2020 Narrative  Ost RCA to Prox RCA lesion is 80% stenosed.  Mid RCA lesion is 30% stenosed.  RPDA lesion is 60% stenosed.  1st Mrg lesion is 50% stenosed.  Prox Cx to Mid Cx lesion is 80% stenosed.  Mid LAD lesion is 80% stenosed. 1. No significant disease in the left main artery 2. Severe mid LAD stenosis. The LAD bifurcated distally beyond this lesion and the diagonal branch reaches the apex. 3. Moderate caliber non-dominant Circumflex with severe mid stenosis. The vessel is relatively small beyond the stenosis. The small to moderate caliber obtuse marginal branch arises prior to the stenosis and has moderate ostial stenosis. 4. The RCA is a large dominant artery. There is severe, heavily calcified stenosis at the ostium of the RCA. The distal stented segment is patent. There is moderately severe stenosis in the moderate caliber PDA. 5. Severe aortic stenosis (mean gradient 26.9 mmHg, peak gradient 34 mmHg)  Echocardiogram 02/29/20 Moderate-severe aortic stenosis, V-max 3.5 m/s, mean gradient 33 mmHg, DI 0.26, moderate MR, EF 65-70, no RWMA, moderate LVH, normal RVSF  GATED SPECT MYO PERF W/EXERCISE STRESS 1D 11/22/2015 Narrative  Nuclear stress EF: 59%. Normal wall motion  There was no ST segment  deviation noted during stress.  This is a low risk study. No perfusion defects, no ischemia   HPI:   81 y.o. with CAD and now post CABG/AVR  03/17/20 Cardiac catheterization demonstrated severe disease in the LAD and RCA.  There was also severe disease in a very small LCx.  She was referred to Dr. Roxy Manns for CABG+AVR. She was admitted 4/13-4/18 and underwent CABG (L-LAD, S-PDA) + bioprosthetic AVR. Using a 23 mm Edwards Inspiris Resilia bovine pericardial valve   Circumflex / OM  too small to graft      Post op TTE 06/15/20 with EF 60-65% moderate MR normal AVR with no AR and mean gradient 4 peak 6.7 mmHg DVI 0.56.    Follows with  Dr Donzetta Matters [facility] Had non healing ulcers with angio not showing intervenable dx and had lef AKA by Dr Unk Lightning 03/06/21  Had limb threatening RLE ischemia and rest pain with intervention by Dr Donzetta Matters on 06/12/21 including stent to SFA/popliteal and anterior tibial   She feels much better Has nice prosthetic leg on left and more ambulatory     Past Medical History:  Diagnosis Date   Allergy    Anemia    childhood   Anxiety    Aortic stenosis    Arterial stenosis ([place])    mesenteric   Arthritis    Breast cancer ([place]) 12/05/10   R breast, inv mammary, in situ,ER/PR +,HER2 -   Cancer ([place])    Carcinoma of breast treated with adjuvant chemotherapy ([place])    CKD stage  4 due to type 2 diabetes mellitus ([place])    Claudication ([place])    Coronary artery disease    stent 2007   Depression    Diabetes mellitus    Difficulty sleeping    Diverticulosis 12/10/03   Elevated cholesterol    Generalized weakness    GERD (gastroesophageal reflux disease)    Heart murmur    Hepatitis    as an infant   HSV-1 (herpes simplex virus 1) infection    Hyperlipidemia    Hypertension    Hypothyroidism    Osteoarthritis    PAD (peripheral artery disease) ([place])    Personal history of chemotherapy    Personal history of radiation therapy    Pneumonia    2014 september and March 2022    Rheumatoid arthritis ([place])    S/P aortic valve replacement with bioprosthetic valve 05/04/2020   Edwards Inspiris Resilia stented bovine pericardial tissue valve, size 23 mm   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   LIMA to D3, SVG to PDA, EVH via right thigh   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   amLODipine (NORVASC) 10 MG tablet Take 1 tablet (10 mg total) by mouth daily.   ascorbic acid (VITAMIN C) 250 MG tablet Take 1 tablet (250 mg total) by mouth 2 (two) times daily.   aspirin EC 81 MG tablet Take 81 mg by mouth in the morning.   blood glucose meter kit and supplies Dispense based on patient and insurance preference. Use up to four times daily as directed. (FOR ICD-10 E10.9, E11.9).   clopidogrel (PLAVIX) 75 MG tablet Take 1 tablet (75 mg total) by mouth daily.   Evolocumab (REPATHA SURECLICK) 031 MG/ML SOAJ Inject 1 pen into the skin every 14 (fourteen) days. (Patient taking differently: Inject 140 mg into the skin every 14 (fourteen) days.)   glipiZIDE (GLUCOTROL XL) 10 MG 24 hr tablet Take 1 tablet (10 mg total) by mouth daily with breakfast.   glucose blood (ACCU-CHEK GUIDE) test strip TEST ONCE DAILY   Golimumab (SIMPONI ARIA IV) Inject into the vein every 2 (two) months.   icosapent Ethyl (VASCEPA) 1 g capsule Take 2 capsules (2 g total) by mouth 2 (two) times daily.   insulin glargine (LANTUS) 100 UNIT/ML Solostar Pen Inject 15 Units into the skin 2 (two) times daily.   Insulin Pen Needle (CAREFINE PEN NEEDLES) 32G X 6 MM MISC 1 application by Does not apply route 2 (two) times daily.   levothyroxine (SYNTHROID) 125 MCG tablet Take 125 mcg by mouth daily before breakfast.   metoprolol tartrate  (LOPRESSOR) 50 MG tablet Take 1 tablet (50 mg total) by mouth 2 (two) times daily.   Multiple Vitamin (MULTIVITAMIN WITH MINERALS) TABS tablet Take 1 tablet by mouth daily.   pantoprazole (PROTONIX) 40 MG tablet Take 1 tablet (40 mg total) by mouth daily before breakfast.   PARoxetine (PAXIL) 20 MG tablet Take 1 tablet (20 mg total) by mouth in the morning.   Semaglutide,0.25 or 0.5MG/DOS, 2 MG/1.5ML SOPN Inject 0.5 mg into the skin every Tuesday.   valACYclovir (VALTREX) 1000 MG tablet Take 1 tablet (1,000 mg  total) by mouth daily as needed (Fever Blisters).     Allergies:   Codeine; Antihistamines, diphenhydramine-type; Folic acid; Gabapentin; Lyrica [pregabalin]; Methotrexate; Oxycodone; Statins; Sulfa antibiotics; Erythromycin; and Trulicity [dulaglutide]   Social History   Tobacco Use   Smoking status: Former    Packs/day: 2.00    Years: 40.00    Total pack years: 80.00    Types: Cigarettes    Quit date: 12/19/1997    Years since quitting: 23.6   Smokeless tobacco: Never  Vaping Use   Vaping Use: Never used  Substance Use Topics   Alcohol use: Yes    Alcohol/week: 1.0 standard drink of alcohol    Types: 1 Glasses of wine per week    Comment: occasiona - maybe once a month   Drug use: No     Family Hx: The patient's family history includes Alcoholism in her father; Cancer in her son; Diabetes in her brother; Heart attack in her brother and father; Heart disease in her mother; Hypertension in her brother; Kidney disease in her mother; Lymphoma (age of onset: 27) in her brother; Throat cancer in her father. There is no history of Colon cancer, Stroke, Esophageal cancer, Rectal cancer, or Stomach cancer.  Review of Systems  Constitutional: Negative for decreased appetite and fever.  Respiratory:  Negative for cough.      EKGs/Labs/Other Test Reviewed:    EKG:   SR LVH with strain ? Old IMI 05/30/20   Recent Labs: 02/21/2021: Pro B Natriuretic peptide (BNP) 200.0 03/01/2021: B  Natriuretic Peptide 230.8 03/12/2021: Magnesium 1.8 03/14/2021: ALT 15; Platelets 428 06/12/2021: BUN 31; Creatinine, Ser 1.70; Hemoglobin 12.9; Potassium 4.3; Sodium 139 07/03/2021: TSH 1.08   Recent Lipid Panel Lab Results  Component Value Date/Time   CHOL 104 03/04/2021 01:34 AM   CHOL 125 02/09/2021 08:16 AM   TRIG 154 (H) 03/04/2021 01:34 AM   HDL 38 (L) 03/04/2021 01:34 AM   HDL 43 02/09/2021 08:16 AM   CHOLHDL 2.7 03/04/2021 01:34 AM   LDLCALC 35 03/04/2021 01:34 AM   LDLCALC 45 02/09/2021 08:16 AM   LDLDIRECT 172.0 11/02/2020 09:40 AM      Risk Assessment/Calculations:      Physical Exam:    VS:  BP (!) 122/50   Pulse 81   Ht '5\' 3"'  (1.6 m)   Wt 144 lb (65.3 kg)   SpO2 96%   BMI 25.51 kg/m     Wt Readings from Last 3 Encounters:  07/28/21 144 lb (65.3 kg)  07/26/21 143 lb (64.9 kg)  07/03/21 143 lb 9.6 oz (65.1 kg)     Chronically ill female Post sternotomy SEM through AVR no AR Abdomen benign Post left AKA Poorly palpable RLE pulses         ASSESSMENT & PLAN:    CAD/CABG:  05/04/20 LIMA to Diagonal and SVG to PdA continue ASA And beta blocker AVR:  bicuspid pathology post 23 mm Edwards Inspiris Resilia stented bovine pericardial tissue valve TTE 03/01/21 normal function EF 55-60% no PVL peak gradient only 12 mmHg  HLD:  Intolerant to statins back on Repatha LDL 45 improved  DM:  Discussed low carb diet.  Target hemoglobin A1c is 6.5 or less.  Continue current medications. HTN:  elevated start Norvasc 10 mg  Thyroid : on synthroid replacement check labs  PVD:  post left  AKA 02/2021 and complex limb salvage intervention by Dr Donzetta Matters to RLE 06/12/21 with improvement MR:  TTE 03/01/21 only mild MR  F/U 6 months     Signed, [name], MD  07/28/2021 9:26 AM    [name], [place], [place]  [address] Phone: [phone]; Fax: [phone]

## 2021-07-26 ENCOUNTER — Ambulatory Visit (HOSPITAL_COMMUNITY)
Admission: RE | Admit: 2021-07-26 | Discharge: 2021-07-26 | Disposition: A | Discharge status: Home / Self Care | Payer: Medicare Other | Source: Ambulatory Visit | Attending: Vascular Surgery | Admitting: Vascular Surgery

## 2021-07-26 ENCOUNTER — Encounter: Payer: Self-pay | Admitting: Vascular Surgery

## 2021-07-26 ENCOUNTER — Ambulatory Visit (INDEPENDENT_AMBULATORY_CARE_PROVIDER_SITE_OTHER): Payer: Medicare Other | Admitting: Vascular Surgery

## 2021-07-26 ENCOUNTER — Ambulatory Visit (INDEPENDENT_AMBULATORY_CARE_PROVIDER_SITE_OTHER)
Admission: RE | Admit: 2021-07-26 | Discharge: 2021-07-26 | Disposition: A | Discharge status: Home / Self Care | Payer: Medicare Other | Source: Ambulatory Visit | Attending: Vascular Surgery | Admitting: Vascular Surgery

## 2021-07-26 VITALS — BP 161/77 | HR 80 | Temp 98.1°F | Resp 20 | Ht 63.0 in | Wt 143.0 lb

## 2021-07-26 DIAGNOSIS — I739 Peripheral vascular disease, unspecified: Secondary | ICD-10-CM | POA: Diagnosis present

## 2021-07-26 NOTE — Progress Notes (Signed)
Patient ID: [name], female   DOB: 07-Dec-1940, 81 y.o.   MRN: [id number]  Reason for Consult: Follow-up   Referred by [name], MD  Subjective:     HPI:  [name] is a 81 y.o. 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  She more recently has dorsalis pedis access for treatment of in-stent right SFA stenosis as well as treatment of anterior tibial artery occlusion.  She states that she has no pain in the right leg no wounds.  Her only complaint is right lower extremity swelling at the end of the day but resolves by morning.  She remains on aspirin Plavix does have a severe allergy to statins now on Repatha.  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[place])    Claudication ([place])    Coronary artery disease    stent 2007   Depression    Diabetes mellitus    Difficulty sleeping    Diverticulosis 12/10/03   Elevated cholesterol    Generalized weakness    GERD (gastroesophageal reflux disease)    Heart murmur    Hepatitis    as an infant   HSV-1 (herpes simplex virus 1) infection    Hyperlipidemia    Hypertension    Hypothyroidism    Osteoarthritis    PAD (peripheral artery disease) ([place])    Personal history of chemotherapy    Personal history of radiation therapy    Pneumonia    2014 september and March 2022   Rheumatoid arthritis ([place])    S/P aortic valve replacement with bioprosthetic valve 05/04/2020   [facility] Inspiris Resilia stented bovine pericardial tissue valve, size 23 mm   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   LIMA to D3, SVG to PDA, EVH via right thigh    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   Dr [name]   Spinal stenosis    Thyroid disease    Family History  Problem Relation Age of Onset   Kidney disease Mother    Heart disease Mother    Throat cancer Father    Alcoholism Father    Heart attack Father    Lymphoma Brother 29   Heart attack Brother    Diabetes Brother    Hypertension Brother    Cancer Son    Colon cancer Neg Hx    Stroke Neg Hx    Esophageal cancer Neg Hx    Rectal cancer Neg Hx    Stomach cancer Neg Hx    Past Surgical History:  Procedure Laterality Date   ABDOMINAL AORTOGRAM W/LOWER EXTREMITY Bilateral 04/29/2019   Procedure: ABDOMINAL AORTOGRAM W/LOWER EXTREMITY;  Surgeon: Marty Heck, MD;  Location: [facility];  Service: Cardiovascular;  Laterality: Bilateral;   ABDOMINAL AORTOGRAM W/LOWER EXTREMITY Left 07/01/2019   Procedure: ABDOMINAL AORTOGRAM W/ Left LOWER EXTREMITY [place];  Surgeon: Marty Heck, MD;  Location: [facility];  Service: Cardiovascular;  Laterality: Left;   ABDOMINAL AORTOGRAM W/LOWER  EXTREMITY Left 03/03/2021   Procedure: ABDOMINAL AORTOGRAM W/LOWER EXTREMITY;  Surgeon: Cherre Robins, MD;  Location: [facility];  Service: Cardiovascular;  Laterality: Left;   ABDOMINAL AORTOGRAM W/LOWER EXTREMITY Right 06/12/2021   Procedure: ABDOMINAL AORTOGRAM W/LOWER EXTREMITY;  Surgeon: Waynetta Sandy, MD;  Location: [facility];  Service: Cardiovascular;  Laterality: Right;  LOWER LEG   AMPUTATION Left 03/06/2021   Procedure: LEFT ABOVE KNEE AMPUTATION;  Surgeon: [name], MD;  Location: [facility];  Service: Vascular;  Laterality: Left;   ANTERIOR AND POSTERIOR REPAIR N/A 04/05/2016   Procedure: ANTERIOR (CYSTOCELE) AND POSTERIOR REPAIR (RECTOCELE);  Surgeon: [name], MD;  Location: [facility];  Service: Gynecology;  Laterality: N/A;   AORTIC VALVE REPLACEMENT N/A 05/04/2020   Procedure: AORTIC VALVE REPLACEMENT (AVR) USING INSPIRIS 23MM  RESILIA AORTIC VALVE;  Surgeon: Rexene Alberts, MD;  Location: [facility];  Service: Open Heart Surgery;  Laterality: N/A;   BREAST BIOPSY     BREAST LUMPECTOMY  1983   benign biopsy   BREAST LUMPECTOMY Right 12/29/2010   snbx, ER/PR +, Her2 -, 0/1 node pos.   CARPAL TUNNEL RELEASE Bilateral 9/12,8,12   CHOLECYSTECTOMY     COLONOSCOPY N/A 02/09/2013   Procedure: COLONOSCOPY;  Surgeon: [name], MD;  Location: [facility];  Service: Endoscopy;  Laterality: N/A;   COLONOSCOPY     CORONARY ANGIOPLASTY     CORONARY ARTERY BYPASS GRAFT N/A 05/04/2020   Procedure: CORONARY ARTERY BYPASS GRAFTING (CABG), ON PUMP, TIMES TWO, USING LEFT INTERNAL MAMMARY ARTERY AND RIGHT ENDOSCOPICALLY HARVESTED GREATER SAPHENOUS VEIN;  Surgeon: Rexene Alberts, MD;  Location: [facility];  Service: Open Heart Surgery;  Laterality: N/A;   DILATION AND CURETTAGE OF UTERUS     ESOPHAGOGASTRODUODENOSCOPY N/A 02/09/2013   Procedure: ESOPHAGOGASTRODUODENOSCOPY (EGD);  Surgeon: [name], MD;  Location: [name];  Service: Endoscopy;  Laterality: N/A;   FOOT SPURS     HYSTEROSCOPY WITH D & C N/A 09/11/2012   Procedure: DILATATION AND CURETTAGE ;  Surgeon: [name], MD;  Location: [facility];  Service: Gynecology;  Laterality: N/A;   KNEE SURGERY  2007   LAPAROSCOPIC ASSISTED VAGINAL HYSTERECTOMY N/A 04/05/2016   Procedure: LAPAROSCOPIC ASSISTED VAGINAL HYSTERECTOMY possible BSO;  Surgeon: [name], MD;  Location: [facility];  Service: Gynecology;  Laterality: N/A;   LUMBAR LAMINECTOMY/DECOMPRESSION MICRODISCECTOMY N/A 10/20/2014   Procedure: MICRO LUMBER DECOMPRESSION L3-4 L4-5;  Surgeon: [name], MD;  Location: [facility];  Service: Orthopedics;  Laterality: N/A;   LUMBAR LAMINECTOMY/DECOMPRESSION MICRODISCECTOMY Bilateral 10/13/2015   Procedure: MICRO LUMBAR DECOMPRESSION L5 - S1 AND REDO DECOMPRESSION L4 - L5 AND REMOVAL OF FACET CYST L4 - L5 2 LEVELS;  Surgeon: [name], MD;  Location: [facility];  Service:  Orthopedics;  Laterality: Bilateral;   PERIPHERAL VASCULAR ATHERECTOMY Right 06/12/2021   Procedure: PERIPHERAL VASCULAR ATHERECTOMY;  Surgeon: Waynetta Sandy, MD;  Location: [facility];  Service: Cardiovascular;  Laterality: Right;  SFA/POP/AT   PERIPHERAL VASCULAR INTERVENTION Right 04/29/2019   Procedure: PERIPHERAL VASCULAR INTERVENTION;  Surgeon: Marty Heck, MD;  Location: [facility];  Service: Cardiovascular;  Laterality: Right;   PERIPHERAL VASCULAR INTERVENTION Right 06/12/2021   Procedure: PERIPHERAL VASCULAR INTERVENTION;  Surgeon: Waynetta Sandy, MD;  Location: [facility];  Service: Cardiovascular;  Laterality: Right;  SFP/POPLITEAL/AT   PORT-A-CATH REMOVAL  04/17/2011   Procedure: REMOVAL PORT-A-CATH;  Surgeon: [name], MD;  Location: [facility];  Service: General;  Laterality: Left;   PORTACATH PLACEMENT  02/07/2011   Procedure: INSERTION PORT-A-CATH;  Surgeon: [name], MD;  [facility];  Service: General;  Laterality: N/A;   RIGHT/LEFT HEART CATH AND CORONARY ANGIOGRAPHY N/A 03/17/2020   Procedure: RIGHT/LEFT HEART CATH AND CORONARY ANGIOGRAPHY;  Surgeon: Burnell Blanks, MD;  Location: [facility];  Service: Cardiovascular;  Laterality: N/A;   TEE WITHOUT CARDIOVERSION N/A 05/04/2020   Procedure: TRANSESOPHAGEAL ECHOCARDIOGRAM (TEE);  Surgeon: Rexene Alberts, MD;  Location: [facility];  Service: Open Heart Surgery;  Laterality: N/A;   UPPER GASTROINTESTINAL ENDOSCOPY      Short Social History:  Social History   Tobacco Use   Smoking status: Former    Packs/day: 2.00    Years: 40.00    Total pack years: 80.00    Types: Cigarettes    Quit date: 12/19/1997    Years since quitting: 23.6   Smokeless tobacco: Never  Substance Use Topics   Alcohol use: Yes    Alcohol/week: 1.0 standard drink of alcohol    Types: 1 Glasses of wine per week    Comment: occasiona - maybe once a  month    Allergies  Allergen Reactions   Codeine Nausea And Vomiting and Other (See Comments)    Severe stomach cramps, also   Antihistamines, Diphenhydramine-Type Other (See Comments)    Causes hyperactivity   Gabapentin     Urinary incontinence   Lyrica [Pregabalin]     Double vision/falls   Oxycodone     Hallucinations/crazy feelings   Statins Other (See Comments)    Joint pains   Sulfa Antibiotics Other (See Comments)    Reaction not recalled   Erythromycin Hives, Rash and Other (See Comments)    Allergic due to dental work from 50 years ago. It was used in packing and resulted in rash/hives inside and outside of mouth.   Trulicity [Dulaglutide] Itching    Current Outpatient Medications  Medication Sig Dispense Refill   Accu-Chek FastClix Lancets MISC USE TO CHECK BLOOD SUGARS 3 TIMES DAILY 306 each 3   acetaminophen (TYLENOL) 500 MG tablet Take 500 mg by mouth every 6 (six) hours as needed for mild pain or headache.     amLODipine (NORVASC) 10 MG tablet Take 1 tablet (10 mg total) by mouth daily. 90 tablet 3   ascorbic acid (VITAMIN C) 250 MG tablet Take 1 tablet (250 mg total) by mouth 2 (two) times daily. 60 tablet 0   aspirin EC 81 MG tablet Take 81 mg by mouth in the morning.     blood glucose meter kit and supplies Dispense based on patient and insurance preference. Use up to four times daily as directed. (FOR ICD-10 E10.9, E11.9). 1 each 0   clopidogrel (PLAVIX) 75 MG tablet Take 1 tablet (75 mg total) by mouth daily. 30 tablet 11   Evolocumab (REPATHA SURECLICK) 269 MG/ML SOAJ Inject 1 pen into the skin every 14 (fourteen) days. (Patient taking differently: Inject 140 mg into the skin every 14 (fourteen) days.) 2 mL 11   glipiZIDE (GLUCOTROL XL) 10 MG 24 hr tablet Take 1 tablet (10 mg total) by mouth daily with breakfast. 90 tablet 3   glucose blood (ACCU-CHEK GUIDE) test strip TEST ONCE DAILY 100 each 5   Golimumab (SIMPONI ARIA IV) Inject into the vein every 2 (two)  months.     icosapent Ethyl (VASCEPA) 1 g capsule Take 2 capsules (2 g total) by mouth 2 (two)  times daily. 120 capsule 11   insulin glargine (LANTUS) 100 UNIT/ML Solostar Pen Inject 15 Units into the skin 2 (two) times daily. 15 mL 3   Insulin Pen Needle (CAREFINE PEN NEEDLES) 32G X 6 MM MISC 1 application by Does not apply route 2 (two) times daily. 120 each 1   levothyroxine (SYNTHROID) 125 MCG tablet Take 125 mcg by mouth daily before breakfast.     metoprolol tartrate (LOPRESSOR) 50 MG tablet Take 1 tablet (50 mg total) by mouth 2 (two) times daily. 60 tablet 0   Multiple Vitamin (MULTIVITAMIN WITH MINERALS) TABS tablet Take 1 tablet by mouth daily.     pantoprazole (PROTONIX) 40 MG tablet Take 1 tablet (40 mg total) by mouth daily before breakfast.     PARoxetine (PAXIL) 20 MG tablet Take 1 tablet (20 mg total) by mouth in the morning. 90 tablet 3   Semaglutide,0.25 or 0.5MG/DOS, 2 MG/1.5ML SOPN Inject 0.5 mg into the skin every Tuesday. 3 mL 6   valACYclovir (VALTREX) 1000 MG tablet Take 1 tablet (1,000 mg total) by mouth daily as needed (Fever Blisters). 180 tablet 0   No current facility-administered medications for this visit.    Review of Systems  Constitutional:  Constitutional negative. HENT: HENT negative.  Eyes: Eyes negative.  Cardiovascular: Positive for leg swelling.  GI: Gastrointestinal negative.  Skin: Skin negative.  Neurological: Neurological negative. Hematologic: Hematologic/lymphatic negative.  Psychiatric: Psychiatric negative.        Objective:  Objective   Vitals:   07/26/21 1135  BP: (!) 161/77  Pulse: 80  Resp: 20  Temp: 98.1 F (36.7 C)  SpO2: 95%  Weight: 143 lb (64.9 kg)  Height: '5\' 3"'  (1.6 m)   Body mass index is 25.33 kg/m.  Physical Exam HENT:     Head: Normocephalic.     Nose: Nose normal.  Eyes:     Pupils: Pupils are equal, round, and reactive to light.  Cardiovascular:     Pulses:          Popliteal pulses are 2+ on the  right side.       Dorsalis pedis pulses are 1+ on the right side.  Pulmonary:     Effort: Pulmonary effort is normal.  Abdominal:     General: Abdomen is flat.     Palpations: Abdomen is soft.  Musculoskeletal:     Comments: Left above-knee prosthesis  Skin:    General: Skin is warm and dry.     Capillary Refill: Capillary refill takes less than 2 seconds.  Neurological:     Mental Status: She is alert.  Psychiatric:        Mood and Affect: Mood normal.        Thought Content: Thought content normal.        Judgment: Judgment normal.     Data: ABI Findings:  +---------+------------------+-----+--------+--------+  Right    Rt Pressure (mmHg)IndexWaveformComment   +---------+------------------+-----+--------+--------+  Brachial 168                                      +---------+------------------+-----+--------+--------+  PTA      122               0.73 biphasic          +---------+------------------+-----+--------+--------+  DP       147  0.88 biphasic          +---------+------------------+-----+--------+--------+  Great Toe62                0.37                   +---------+------------------+-----+--------+--------+   +--------+------------------+-----+--------+-------+  Left    Lt Pressure (mmHg)IndexWaveformComment  +--------+------------------+-----+--------+-------+  [id number]                                     +--------+------------------+-----+--------+-------+   +-------+-----------+-----------+------------+------------+  ABI/TBIToday's ABIToday's TBIPrevious ABIPrevious TBI  +-------+-----------+-----------+------------+------------+  Right  0.88       0.37       0.44        0.09          +-------+-----------+-----------+------------+------------+  Left   AKA        AKA        AKA         AKA           +-------+-----------+-----------+------------+------------+   RIGHT     PSV  cm/sRatioStenosis       Waveform  Comments  +----------+--------+-----+---------------+----------+--------+  CFA Distal122                         biphasic            +----------+--------+-----+---------------+----------+--------+  DFA       153                         biphasic            +----------+--------+-----+---------------+----------+--------+  SFA Prox  120                         triphasic           +----------+--------+-----+---------------+----------+--------+  SFA Mid   70                          biphasic            +----------+--------+-----+---------------+----------+--------+  SFA Distal89                          biphasic            +----------+--------+-----+---------------+----------+--------+  POP Prox  46                          biphasic            +----------+--------+-----+---------------+----------+--------+  ATA Prox  312          50-74% stenosismonophasic          +----------+--------+-----+---------------+----------+--------+  ATA Mid   638          75-99% stenosismonophasic          +----------+--------+-----+---------------+----------+--------+  ATA Distal64                          monophasic          +----------+--------+-----+---------------+----------+--------+        Right Stent(s):  +---------------+--------+--------+--------+--------+  SFA/popliteal  PSV cm/sStenosisWaveformComments  +---------------+--------+--------+--------+--------+  Prox to Stent  102             biphasic          +---------------+--------+--------+--------+--------+  Proximal Stent 81              biphasic          +---------------+--------+--------+--------+--------+  Mid Stent      80              biphasic          +---------------+--------+--------+--------+--------+  Distal Stent   56              biphasic          +---------------+--------+--------+--------+--------+   Distal to Stent89              biphasic          +---------------+--------+--------+--------+--------+             Summary:  Right: Right superficial femoral artery/popliteal artery stenting appears  patent with no evidence of significant stenosis. Right anterior tibial  artery stent was unable to be visualized, however, the artery appears  patent with elevated velocities in the  proximal and mid segments suggesting >50% stenosis.        Assessment/Plan:    81 year old female with a history of left above-knee amputation status post dorsalis pedis access for revascularization right lower extremity now with significant improvement in toe pressure and a palpable pedal pulse can be traced with Doppler after the first web interspace.  Her foot is very well perfused.  She will follow-up in 6 months with repeat lower extremity studies unless she has issues prior.  We discussed protecting her right foot during her therapy to prevent any wounds.     Waynetta Sandy MD [facility]

## 2021-07-26 NOTE — Therapy (Signed)
OUTPATIENT PHYSICAL THERAPY PROSTHETICS TREATMENT   Patient Name: [name] MRN: [id number] DOB:Jun 08, 1940, 81 y.o., female 54 Date: 07/27/2021  PCP: Arlester Marker  REFERRING PROVIDER: Charlett Blake, MD    PT End of Session - 07/27/21 0926     Visit Number 7    Number of Visits 17    Date for PT Re-Evaluation 08/29/21    Authorization Type [employer]    Authorization Time Period 07/04/21 to 08/29/21    Progress Note Due on Visit 10    PT Start Time 0930    PT Stop Time 1012    PT Time Calculation (min) 42 min    Equipment Utilized During Treatment Gait belt    Activity Tolerance Patient tolerated treatment well    Behavior During Therapy WFL for tasks assessed/performed                   Past Medical History:  Diagnosis Date   Allergy    Anemia    childhood   Anxiety    Aortic stenosis    Arterial stenosis (HCC)    mesenteric   Arthritis    Breast cancer ([place]) 12/05/10   R breast, inv mammary, in situ,ER/PR +,HER2 -   Cancer ([place])    Carcinoma of breast treated with adjuvant chemotherapy ([place])    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[place])    Claudication ([place])    Coronary artery disease    stent 2007   Depression    Diabetes mellitus    Difficulty sleeping    Diverticulosis 12/10/03   Elevated cholesterol    Generalized weakness    GERD (gastroesophageal reflux disease)    Heart murmur    Hepatitis    as an infant   HSV-1 (herpes simplex virus 1) infection    Hyperlipidemia    Hypertension    Hypothyroidism    Osteoarthritis    PAD (peripheral artery disease) ([place])    Personal history of chemotherapy    Personal history of radiation therapy    Pneumonia    2014 september and March 2022   Rheumatoid arthritis ([place])    S/P aortic valve replacement with bioprosthetic valve 05/04/2020   [facility] Inspiris Resilia stented bovine pericardial tissue valve, size 23 mm   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   LIMA to D3, SVG to PDA, EVH via  right thigh   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   Dr [name]   Spinal stenosis    Thyroid disease    Past Surgical History:  Procedure Laterality Date   ABDOMINAL AORTOGRAM W/LOWER EXTREMITY Bilateral 04/29/2019   Procedure: ABDOMINAL AORTOGRAM W/LOWER EXTREMITY;  Surgeon: Marty Heck, MD;  Location: [facility];  Service: Cardiovascular;  Laterality: Bilateral;   ABDOMINAL AORTOGRAM W/LOWER EXTREMITY Left 07/01/2019   Procedure: ABDOMINAL AORTOGRAM W/ Left LOWER EXTREMITY [place];  Surgeon: Marty Heck, MD;  Location: [facility];  Service: Cardiovascular;  Laterality: Left;   ABDOMINAL AORTOGRAM W/LOWER EXTREMITY Left 03/03/2021   Procedure: ABDOMINAL AORTOGRAM W/LOWER EXTREMITY;  Surgeon: Cherre Robins, MD;  Location: [facility] [facility];  Service: Cardiovascular;  Laterality: Left;   ABDOMINAL AORTOGRAM W/LOWER EXTREMITY Right 06/12/2021   Procedure: ABDOMINAL AORTOGRAM W/LOWER EXTREMITY;  Surgeon: Waynetta Sandy, MD;  Location: [facility];  Service: Cardiovascular;  Laterality: Right;  LOWER LEG   AMPUTATION Left 03/06/2021   Procedure: LEFT ABOVE KNEE AMPUTATION;  Surgeon: [name],  MD;  Location: [facility];  Service: Vascular;  Laterality: Left;   ANTERIOR AND POSTERIOR REPAIR N/A 04/05/2016   Procedure: ANTERIOR (CYSTOCELE) AND POSTERIOR REPAIR (RECTOCELE);  Surgeon: [name], MD;  Location: [facility];  Service: Gynecology;  Laterality: N/A;   AORTIC VALVE REPLACEMENT N/A 05/04/2020   Procedure: AORTIC VALVE REPLACEMENT (AVR) USING INSPIRIS 23MM RESILIA AORTIC VALVE;  Surgeon: Rexene Alberts, MD;  Location: [facility];  Service: Open Heart Surgery;  Laterality: N/A;   BREAST BIOPSY     BREAST LUMPECTOMY  1983   benign biopsy   BREAST LUMPECTOMY Right 12/29/2010   snbx, ER/PR +, Her2 -, 0/1 node pos.   CARPAL TUNNEL RELEASE Bilateral 9/12,8,12   CHOLECYSTECTOMY     COLONOSCOPY N/A 02/09/2013   Procedure: COLONOSCOPY;  Surgeon:  [name], MD;  Location: [facility];  Service: Endoscopy;  Laterality: N/A;   COLONOSCOPY     CORONARY ANGIOPLASTY     CORONARY ARTERY BYPASS GRAFT N/A 05/04/2020   Procedure: CORONARY ARTERY BYPASS GRAFTING (CABG), ON PUMP, TIMES TWO, USING LEFT INTERNAL MAMMARY ARTERY AND RIGHT ENDOSCOPICALLY HARVESTED GREATER SAPHENOUS VEIN;  Surgeon: Rexene Alberts, MD;  Location: [facility];  Service: Open Heart Surgery;  Laterality: N/A;   DILATION AND CURETTAGE OF UTERUS     ESOPHAGOGASTRODUODENOSCOPY N/A 02/09/2013   Procedure: ESOPHAGOGASTRODUODENOSCOPY (EGD);  Surgeon: [name], MD;  Location: [name];  Service: Endoscopy;  Laterality: N/A;   FOOT SPURS     HYSTEROSCOPY WITH D & C N/A 09/11/2012   Procedure: DILATATION AND CURETTAGE ;  Surgeon: [name], MD;  Location: [facility];  Service: Gynecology;  Laterality: N/A;   KNEE SURGERY  2007   LAPAROSCOPIC ASSISTED VAGINAL HYSTERECTOMY N/A 04/05/2016   Procedure: LAPAROSCOPIC ASSISTED VAGINAL HYSTERECTOMY possible BSO;  Surgeon: [name], MD;  Location: [facility];  Service: Gynecology;  Laterality: N/A;   LUMBAR LAMINECTOMY/DECOMPRESSION MICRODISCECTOMY N/A 10/20/2014   Procedure: MICRO LUMBER DECOMPRESSION L3-4 L4-5;  Surgeon: [name], MD;  Location: [facility];  Service: Orthopedics;  Laterality: N/A;   LUMBAR LAMINECTOMY/DECOMPRESSION MICRODISCECTOMY Bilateral 10/13/2015   Procedure: MICRO LUMBAR DECOMPRESSION L5 - S1 AND REDO DECOMPRESSION L4 - L5 AND REMOVAL OF FACET CYST L4 - L5 2 LEVELS;  Surgeon: [name], MD;  Location: [facility];  Service: Orthopedics;  Laterality: Bilateral;   PERIPHERAL VASCULAR ATHERECTOMY Right 06/12/2021   Procedure: PERIPHERAL VASCULAR ATHERECTOMY;  Surgeon: Waynetta Sandy, MD;  Location: [facility];  Service: Cardiovascular;  Laterality: Right;  SFA/POP/AT   PERIPHERAL VASCULAR INTERVENTION Right 04/29/2019   Procedure: PERIPHERAL VASCULAR INTERVENTION;  Surgeon: Marty Heck,  MD;  Location: [facility];  Service: Cardiovascular;  Laterality: Right;   PERIPHERAL VASCULAR INTERVENTION Right 06/12/2021   Procedure: PERIPHERAL VASCULAR INTERVENTION;  Surgeon: Waynetta Sandy, MD;  Location: [facility];  Service: Cardiovascular;  Laterality: Right;  SFP/POPLITEAL/AT   PORT-A-CATH REMOVAL  04/17/2011   Procedure: REMOVAL PORT-A-CATH;  Surgeon: [name], MD;  Location: [facility];  Service: General;  Laterality: Left;   PORTACATH PLACEMENT  02/07/2011   Procedure: INSERTION PORT-A-CATH;  Surgeon: [name], MD;  [facility];  Service: General;  Laterality: N/A;   RIGHT/LEFT HEART CATH AND CORONARY ANGIOGRAPHY N/A 03/17/2020   Procedure: RIGHT/LEFT HEART CATH AND CORONARY ANGIOGRAPHY;  Surgeon: Burnell Blanks, MD;  Location: [facility];  Service: Cardiovascular;  Laterality: N/A;   TEE WITHOUT CARDIOVERSION N/A 05/04/2020   Procedure: TRANSESOPHAGEAL ECHOCARDIOGRAM (  TEE);  Surgeon: Rexene Alberts, MD;  Location: [facility];  Service: Open Heart Surgery;  Laterality: N/A;   UPPER GASTROINTESTINAL ENDOSCOPY     Patient Active Problem List   Diagnosis Date Noted   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[place]) 03/13/2021   Sinus tachycardia 03/11/2021   Elevated troponin 03/08/2021   Stroke-like symptoms 03/08/2021   Anemia of chronic disease 03/08/2021   Bilateral lower extremity edema 02/21/2021   Acute supraglottitis with epiglottitis 02/05/2021   Epiglottitis 02/05/2021   Reactive airway disease 01/20/2021   Diabetic peripheral neuropathy ([place]) 12/19/2020   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   Primary insomnia 11/02/2020   Irritation of eyelid 07/26/2020   S/P aortic valve replacement with bioprosthetic valve 05/04/2020   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   Peripheral artery disease (HCC)    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[place]) 04/07/2019   Low back pain 02/03/2019    Scoliosis deformity of spine 02/03/2019   Osteoarthritis of hip 11/17/2018   Claudication ([place])    Chronic headaches 11/12/2017   Degeneration of lumbar intervertebral disc 06/13/2017   Hyperkalemia 06/03/2017   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[place])    Mass of left adrenal gland ([place]) 04/19/2017   Spinal stenosis of lumbar region 10/20/2014   Hyperlipidemia 03/16/2013   Dense breasts 12/22/2012   Anxiety state 04/16/2012   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/16/2012   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   Osteoarthritis of knee 04/16/2012   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 female breast ([place]) 12/20/2010   History of breast cancer 12/05/2010   Depression 09/14/2008   GERD 09/14/2008   Type 2 diabetes mellitus with neurologic complication, with long-term current use of insulin ([place]) 05/20/2008   Coronary atherosclerosis 04/17/2007   Hypothyroidism 04/03/2007    ONSET DATE: 05/29/2021   REFERRING DIAG: B35.329J (ICD-10-CM) - 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[place])   THERAPY DIAG:  Difficulty in walking, not elsewhere classified  Unsteadiness on feet  Other abnormalities of gait and mobility  Muscle weakness (generalized)  Rationale for Evaluation and Treatment Rehabilitation  SUBJECTIVE:   SUBJECTIVE STATEMENT: I am doing good, trying to walk around the house twice a day.    Pt accompanied by: significant other  PERTINENT HISTORY: PAD, GERD, CKD, DM with neuropathy, OA and RA, scoliosis, inflammatory polyarthropathy, breast CA, HLD, depression, CABG, Lx surgery, knee surgery    PAIN:  Are you having pain? No   PRECAUTIONS: Fall and Other: AKA   WEIGHT BEARING RESTRICTIONS No  FALLS: Has patient fallen in last 6 months? Yes. Number of falls 2  LIVING ENVIRONMENT: Lives with: lives with their spouse Lives in: House/apartment Home Access: Long: One level Stairs: No Has  following equipment at home: Environmental consultant - 2 wheeled, Wheelchair (manual), Shower bench, bed side commode, Grab bars, and Ramped entry  PLOF: Independent, Independent with basic ADLs, Independent with gait, and Independent with transfers  PATIENT GOALS get up and get going      TODAY'S TREATMENT:  07/26/21 Bridges 3x10 SK2C 30s SLR 2x10 minA to hold prosthetic at foot  Sidelying ext 3x10 Sidelying abd 2x10 Hip flexor stretch supine w/leg off table  Step ups 2HHA 4" minA  Walking with RW  Side steps in // bars  07/20/21  Bridges 2x10 Trunk rotations 80mn Sidelying ext 3x10 Sidelying abd 3x10 Step up 6" w/non prosthetic side 2x10 Walking forwards/backwards in // bars  Walking w/RW 2laps Nustep L3, 71mns   07/17/21 Prone stretch 373ms Bridges 2x10 Sidelying hip abd 2x10 minA Sidelying hip ext 2x10 minA STS from elevated table 2x10 Standing hip flexion 2x10 Standing hip abd 2x10 Forwards/Backwards/sideways walking in //   07/13/21  Prone hip flexor stretch x3 minutes Sidelying hip ABD 2x10 B prosthetic on/MinA  Sidelying L hip extension 2x10  B prosthetic on/MinA  Bridges 1x15   WC and prosthetic management with mobility, safety with mobility- more impulsive with transfers today  Standing hip flexion B 1x10 close min guard cues for locking knee on prosthetic Standing hip ABD B 1x10 close in min guard cues for locking knee on prosthetic  Backward walking in // bars min guard for safety Cross midline reaches to L over prosthetic 1x10 for increased WB on residual limb  with practice locking/unlocking prosthetic     07/10/21 Prosthetic donning/doffing  Prone hip flexor stretch  Sidelying hip abd 2x10 Supine bridges 2x10 STS on elevated table  x10 Weight shifts in // bars  Side steps  Walking w/RW   07/06/21 Prosthetic doning/doffing Weight shifting side to side and forwards Step up taps with 2HHA  Walking in parallel bars  Gait training w/walker  PATIENT  EDUCATED ON FOLLOWING PROSTHETIC CARE: Prosthetic wear tolerance: 8 hours  hours/day, 7 days/week Prosthetic weight bearing tolerance: 8  hours Other education  Skin check, Residual limb care, Care of non-amputated limb, Prosthetic cleaning, and Proper wear schedule/adjustment    PATIENT EDUCATION: Education details: exercise form and purpose, prosthetic management, safety with pivot transfers while wearing prosthetic  Person educated: Patient and Spouse Education method: Explanation, Demonstration, and Handouts Education comprehension: needs further education   HOME EXERCISE PROGRAM: [id number]:  CLINICAL IMPRESSION: Patient doing better with stand pivot transfers, and safety when standing up from wheelchair. We tried steps today [facility] difficulty stepping up with prosthetic side. Able to walk 2 laps with RW, prosthetic buckled one time. She is taking better steps and not looking down at her feet as much. Continue with strengthening, gait and balance.      OBJECTIVE IMPAIRMENTS Abnormal gait, decreased activity tolerance, decreased balance, decreased coordination, decreased endurance, decreased knowledge of use of DME, decreased mobility, difficulty walking, decreased ROM, decreased strength, decreased safety awareness, increased fascial restrictions, increased muscle spasms, impaired flexibility, and postural dysfunction.   ACTIVITY LIMITATIONS standing, squatting, stairs, transfers, and locomotion level  PARTICIPATION LIMITATIONS: driving, shopping, community activity, and yard work  PERSONAL FACTORS Time since onset of injury/illness/exacerbation are also affecting patient's functional outcome.   REHAB POTENTIAL: Good  CLINICAL DECISION MAKING: Evolving/moderate complexity  EVALUATION COMPLEXITY: Moderate   GOALS: Goals reviewed with patient? No  SHORT TERM GOALS: Target date: 08/24/2021    Will be compliant with appropriate HEP  Baseline: Goal  status: INITIAL  2.  Will be able to don/doff prosthetic with Mod(I) Baseline:  Goal status: INITIAL  3.  Will be able to perform stand pivot transfers and ambulate household distances with no more than SBA and LRAD  Baseline:  Goal status: INITIAL  4.  Will demonstrate thorough and complete skin check and verbalize appropriate progression of prosthetic wear schedule  Baseline:  Goal status: INITIAL   LONG TERM GOALS: Target date: 09/21/2021    MMT in all weak groups to have improved by one grade  Baseline:  Goal status: INITIAL  2.  Hip flexor mm flexibility/mobility to be no more than 20% limited to help improve gait mechanics  Baseline:  Goal status: INITIAL  3.  Will be able to ambulate at least 274f with LRAD and no more than SBA before fatiguing  Baseline:  Goal status: INITIAL  4.  Will be able to navigate at least 4 steps with LRAD safely with no more than min guard assist  Baseline:  Goal status: INITIAL  5.  Will be able to ambulate at least 5102fover uneven surfaces like grass and gravel with LRAD and no more than min guard assist  Baseline:  Goal status: INITIAL  6.  Will score at least 45 on [place]  Baseline:  Goal status: INITIAL   PLAN: PT FREQUENCY: 2x/week  PT DURATION: 8 weeks  PLANNED INTERVENTIONS: Therapeutic exercises, Therapeutic activity, Neuromuscular re-education, Balance training, Gait training, Patient/Family education, Joint mobilization, Stair training, Prosthetic training, DME instructions, Wheelchair mobility training, Cryotherapy, Moist heat, Taping, Ultrasound, Biofeedback, Manual therapy, and Re-evaluation  PLAN FOR NEXT SESSION: pre gait tasks, strength, gait training, prosthetic management    MoAndris BaumannDPT 07/27/2021, 10:14 AM

## 2021-07-27 ENCOUNTER — Ambulatory Visit: Payer: Medicare Other | Attending: Family Medicine

## 2021-07-27 DIAGNOSIS — M6281 Muscle weakness (generalized): Secondary | ICD-10-CM | POA: Diagnosis present

## 2021-07-27 DIAGNOSIS — R2689 Other abnormalities of gait and mobility: Secondary | ICD-10-CM | POA: Insufficient documentation

## 2021-07-27 DIAGNOSIS — R262 Difficulty in walking, not elsewhere classified: Secondary | ICD-10-CM | POA: Insufficient documentation

## 2021-07-27 DIAGNOSIS — R2681 Unsteadiness on feet: Secondary | ICD-10-CM | POA: Insufficient documentation

## 2021-07-28 ENCOUNTER — Encounter: Payer: Self-pay | Admitting: Cardiovascular Disease

## 2021-07-28 ENCOUNTER — Ambulatory Visit (INDEPENDENT_AMBULATORY_CARE_PROVIDER_SITE_OTHER): Payer: Medicare Other | Admitting: Cardiovascular Disease

## 2021-07-28 ENCOUNTER — Other Ambulatory Visit: Payer: Self-pay

## 2021-07-28 VITALS — BP 122/50 | HR 81 | Ht 63.0 in | Wt 144.0 lb

## 2021-07-28 DIAGNOSIS — I739 Peripheral vascular disease, unspecified: Secondary | ICD-10-CM

## 2021-07-28 DIAGNOSIS — Z953 Presence of xenogenic heart valve: Secondary | ICD-10-CM | POA: Diagnosis not present

## 2021-07-28 DIAGNOSIS — I2510 Atherosclerotic heart disease of native coronary artery without angina pectoris: Secondary | ICD-10-CM

## 2021-07-28 NOTE — Patient Instructions (Signed)
Medication Instructions:  Your physician recommends that you continue on your current medications as directed. Please refer to the Current Medication list given to you today.  *If you need a refill on your cardiac medications before your next appointment, please call your pharmacy*  Lab Work: If you have labs (blood work) drawn today and your tests are completely normal, you will receive your results only by: MyChart Message (if you have MyChart) OR A paper copy in the mail If you have any lab test that is abnormal or we need to change your treatment, we will call you to review the results.  Testing/Procedures: None ordered today.  Follow-Up: At [facility], you and your health needs are our priority.  As part of our continuing mission to provide you with exceptional heart care, we have created designated Provider Care Teams.  These Care Teams include your primary Cardiologist (physician) and Advanced Practice Providers (APPs -  Physician Assistants and Nurse Practitioners) who all work together to provide you with the care you need, when you need it.  We recommend signing up for the patient portal called "MyChart".  Sign up information is provided on this After Visit Summary.  MyChart is used to connect with patients for Virtual Visits (Telemedicine).  Patients are able to view lab/test results, encounter notes, upcoming appointments, etc.  Non-urgent messages can be sent to your provider as well.   To learn more about what you can do with MyChart, go to [email or web address].    Your next appointment:   6 month(s)  The format for your next appointment:   In Person  Provider:   Peter Nishan, MD {   Important Information About Sugar       

## 2021-08-03 ENCOUNTER — Ambulatory Visit: Payer: Medicare Other | Admitting: Physical Therapy

## 2021-08-03 ENCOUNTER — Encounter: Payer: Self-pay | Admitting: Physical Therapy

## 2021-08-03 DIAGNOSIS — R262 Difficulty in walking, not elsewhere classified: Secondary | ICD-10-CM

## 2021-08-03 DIAGNOSIS — R2689 Other abnormalities of gait and mobility: Secondary | ICD-10-CM

## 2021-08-03 DIAGNOSIS — R2681 Unsteadiness on feet: Secondary | ICD-10-CM

## 2021-08-03 DIAGNOSIS — M6281 Muscle weakness (generalized): Secondary | ICD-10-CM

## 2021-08-03 NOTE — Therapy (Signed)
OUTPATIENT PHYSICAL THERAPY PROSTHETICS TREATMENT   Patient Name: [name] MRN: [id number] DOB:March 06, 1940, 81 y.o., female 73 Date: 08/03/2021  PCP: Arlester Marker  REFERRING PROVIDER: Charlett Blake, MD    PT End of Session - 08/03/21 1102     Visit Number 8    PT Start Time 5465    PT Stop Time 1100    PT Time Calculation (min) 45 min    Activity Tolerance Patient tolerated treatment well    Behavior During Therapy [facility] for tasks assessed/performed                   Past Medical History:  Diagnosis Date   Allergy    Anemia    childhood   Anxiety    Aortic stenosis    Arterial stenosis ([place])    mesenteric   Arthritis    Breast cancer ([place]) 12/05/10   R breast, inv mammary, in situ,ER/PR +,HER2 -   Cancer ([place])    Carcinoma of breast treated with adjuvant chemotherapy ([place])    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[place])    Claudication ([place])    Coronary artery disease    stent 2007   Depression    Diabetes mellitus    Difficulty sleeping    Diverticulosis 12/10/03   Elevated cholesterol    Generalized weakness    GERD (gastroesophageal reflux disease)    Heart murmur    Hepatitis    as an infant   HSV-1 (herpes simplex virus 1) infection    Hyperlipidemia    Hypertension    Hypothyroidism    Osteoarthritis    PAD (peripheral artery disease) ([place])    Personal history of chemotherapy    Personal history of radiation therapy    Pneumonia    2014 september and March 2022   Rheumatoid arthritis ([place])    S/P aortic valve replacement with bioprosthetic valve 05/04/2020   [facility] Inspiris Resilia stented bovine pericardial tissue valve, size 23 mm   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   LIMA to D3, SVG to PDA, EVH via right thigh   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   Dr [name]   Spinal stenosis    Thyroid disease    Past Surgical History:  Procedure Laterality Date   ABDOMINAL AORTOGRAM W/LOWER EXTREMITY Bilateral 04/29/2019    Procedure: ABDOMINAL AORTOGRAM W/LOWER EXTREMITY;  Surgeon: Marty Heck, MD;  Location: [place] [facility];  Service: Cardiovascular;  Laterality: Bilateral;   ABDOMINAL AORTOGRAM W/LOWER EXTREMITY Left 07/01/2019   Procedure: ABDOMINAL AORTOGRAM W/ Left LOWER EXTREMITY [place];  Surgeon: Marty Heck, MD;  Location: [place] [facility];  Service: Cardiovascular;  Laterality: Left;   ABDOMINAL AORTOGRAM W/LOWER EXTREMITY Left 03/03/2021   Procedure: ABDOMINAL AORTOGRAM W/LOWER EXTREMITY;  Surgeon: Cherre Robins, MD;  Location: [place] [facility];  Service: Cardiovascular;  Laterality: Left;   ABDOMINAL AORTOGRAM W/LOWER EXTREMITY Right 06/12/2021   Procedure: ABDOMINAL AORTOGRAM W/LOWER EXTREMITY;  Surgeon: Waynetta Sandy, MD;  Location: [place] [facility];  Service: Cardiovascular;  Laterality: Right;  LOWER LEG   AMPUTATION Left 03/06/2021   Procedure: LEFT ABOVE KNEE AMPUTATION;  Surgeon: [name], MD;  Location: [place];  Service: Vascular;  Laterality: Left;   ANTERIOR AND POSTERIOR REPAIR N/A 04/05/2016   Procedure: ANTERIOR (CYSTOCELE) AND POSTERIOR REPAIR (RECTOCELE);  Surgeon: [name], MD;  Location: [place] [facility];  Service: Gynecology;  Laterality: N/A;   AORTIC VALVE REPLACEMENT  N/A 05/04/2020   Procedure: AORTIC VALVE REPLACEMENT (AVR) USING INSPIRIS 23MM RESILIA AORTIC VALVE;  Surgeon: Rexene Alberts, MD;  Location: [place];  Service: Open Heart Surgery;  Laterality: N/A;   BREAST BIOPSY     BREAST LUMPECTOMY  1983   benign biopsy   BREAST LUMPECTOMY Right 12/29/2010   snbx, ER/PR +, Her2 -, 0/1 node pos.   CARPAL TUNNEL RELEASE Bilateral 9/12,8,12   CHOLECYSTECTOMY     COLONOSCOPY N/A 02/09/2013   Procedure: COLONOSCOPY;  Surgeon: [name], MD;  [facility];  Service: Endoscopy;  Laterality: N/A;   COLONOSCOPY     CORONARY ANGIOPLASTY     CORONARY ARTERY BYPASS GRAFT N/A 05/04/2020   Procedure: CORONARY ARTERY BYPASS GRAFTING (CABG), ON  PUMP, TIMES TWO, USING LEFT INTERNAL MAMMARY ARTERY AND RIGHT ENDOSCOPICALLY HARVESTED GREATER SAPHENOUS VEIN;  Surgeon: Rexene Alberts, MD;  Location: [place];  Service: Open Heart Surgery;  Laterality: N/A;   DILATION AND CURETTAGE OF UTERUS     ESOPHAGOGASTRODUODENOSCOPY N/A 02/09/2013   Procedure: ESOPHAGOGASTRODUODENOSCOPY (EGD);  Surgeon: [name], MD;  Location: [name];  Service: Endoscopy;  Laterality: N/A;   FOOT SPURS     HYSTEROSCOPY WITH D & C N/A 09/11/2012   Procedure: DILATATION AND CURETTAGE ;  Surgeon: [name], MD;  Location: [facility];  Service: Gynecology;  Laterality: N/A;   KNEE SURGERY  2007   LAPAROSCOPIC ASSISTED VAGINAL HYSTERECTOMY N/A 04/05/2016   Procedure: LAPAROSCOPIC ASSISTED VAGINAL HYSTERECTOMY possible BSO;  Surgeon: [name], MD;  Location: [facility];  Service: Gynecology;  Laterality: N/A;   LUMBAR LAMINECTOMY/DECOMPRESSION MICRODISCECTOMY N/A 10/20/2014   Procedure: MICRO LUMBER DECOMPRESSION L3-4 L4-5;  Surgeon: [name], MD;  [facility];  Service: Orthopedics;  Laterality: N/A;   LUMBAR LAMINECTOMY/DECOMPRESSION MICRODISCECTOMY Bilateral 10/13/2015   Procedure: MICRO LUMBAR DECOMPRESSION L5 - S1 AND REDO DECOMPRESSION L4 - L5 AND REMOVAL OF FACET CYST L4 - L5 2 LEVELS;  Surgeon: [name], MD;  [facility];  Service: Orthopedics;  Laterality: Bilateral;   PERIPHERAL VASCULAR ATHERECTOMY Right 06/12/2021   Procedure: PERIPHERAL VASCULAR ATHERECTOMY;  Surgeon: Waynetta Sandy, MD;  Location: [place] [facility];  Service: Cardiovascular;  Laterality: Right;  SFA/POP/AT   PERIPHERAL VASCULAR INTERVENTION Right 04/29/2019   Procedure: PERIPHERAL VASCULAR INTERVENTION;  Surgeon: Marty Heck, MD;  Location: [place] [facility];  Service: Cardiovascular;  Laterality: Right;   PERIPHERAL VASCULAR INTERVENTION Right 06/12/2021   Procedure: PERIPHERAL VASCULAR INTERVENTION;  Surgeon: Waynetta Sandy, MD;   Location: [place] [facility];  Service: Cardiovascular;  Laterality: Right;  SFP/POPLITEAL/AT   PORT-A-CATH REMOVAL  04/17/2011   Procedure: REMOVAL PORT-A-CATH;  Surgeon: [name], MD;  Location: [place];  Service: General;  Laterality: Left;   PORTACATH PLACEMENT  02/07/2011   Procedure: INSERTION PORT-A-CATH;  Surgeon: [name], MD;  [facility];  Service: General;  Laterality: N/A;   RIGHT/LEFT HEART CATH AND CORONARY ANGIOGRAPHY N/A 03/17/2020   Procedure: RIGHT/LEFT HEART CATH AND CORONARY ANGIOGRAPHY;  Surgeon: Burnell Blanks, MD;  Location: [place] [facility];  Service: Cardiovascular;  Laterality: N/A;   TEE WITHOUT CARDIOVERSION N/A 05/04/2020   Procedure: TRANSESOPHAGEAL ECHOCARDIOGRAM (TEE);  Surgeon: Rexene Alberts, MD;  Location: [place];  Service: Open Heart Surgery;  Laterality: N/A;   UPPER GASTROINTESTINAL ENDOSCOPY     Patient Active Problem List   Diagnosis Date Noted   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[place]) 03/13/2021  Sinus tachycardia 03/11/2021   Elevated troponin 03/08/2021   Stroke-like symptoms 03/08/2021   Anemia of chronic disease 03/08/2021   Bilateral lower extremity edema 02/21/2021   Acute supraglottitis with epiglottitis 02/05/2021   Epiglottitis 02/05/2021   Reactive airway disease 01/20/2021   Diabetic peripheral neuropathy ([place]) 12/19/2020   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   Primary insomnia 11/02/2020   Irritation of eyelid 07/26/2020   S/P aortic valve replacement with bioprosthetic valve 05/04/2020   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   Peripheral artery disease ([facility])    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[place]) 04/07/2019   Low back pain 02/03/2019   Scoliosis deformity of spine 02/03/2019   Osteoarthritis of hip 11/17/2018   Claudication ([place])    Chronic headaches 11/12/2017   Degeneration of lumbar intervertebral disc 06/13/2017   Hyperkalemia 06/03/2017   CKD  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 ([place])    Mass of left adrenal gland ([place]) 04/19/2017   Spinal stenosis of lumbar region 10/20/2014   Hyperlipidemia 03/16/2013   Dense breasts 12/22/2012   Anxiety state 04/16/2012   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/16/2012   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   Osteoarthritis of knee 04/16/2012   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 female breast ([place]) 12/20/2010   History of breast cancer 12/05/2010   Depression 09/14/2008   GERD 09/14/2008   Type 2 diabetes mellitus with neurologic complication, with long-term current use of insulin ([place]) 05/20/2008   Coronary atherosclerosis 04/17/2007   Hypothyroidism 04/03/2007    ONSET DATE: 05/29/2021   REFERRING DIAG: H29.924Q (ICD-10-CM) - 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[place])   THERAPY DIAG:  Difficulty in walking, not elsewhere classified  Unsteadiness on feet  Other abnormalities of gait and mobility  Muscle weakness (generalized)  Rationale for Evaluation and Treatment Rehabilitation  SUBJECTIVE:   SUBJECTIVE STATEMENT: Good   Pt accompanied by: significant other  PERTINENT HISTORY: PAD, GERD, CKD, DM with neuropathy, OA and RA, scoliosis, inflammatory polyarthropathy, breast CA, HLD, depression, CABG, Lx surgery, knee surgery    PAIN:  Are you having pain? No   PRECAUTIONS: Fall and Other: AKA   WEIGHT BEARING RESTRICTIONS No  FALLS: Has patient fallen in last 6 months? Yes. Number of falls 2  LIVING ENVIRONMENT: Lives with: lives with their spouse Lives in: House/apartment Home Access: [place]: One level Stairs: No Has following equipment at home: Environmental consultant - 2 wheeled, Wheelchair (manual), Shower bench, bed side commode, Grab bars, and Ramped entry  PLOF: Independent, Independent with basic ADLs, Independent with gait, and Independent with transfers  PATIENT GOALS get up and get going      TODAY'S  TREATMENT:  08/03/21 NuStep L3 x 4 min  Ambulate ~66f. With RW and CGA, one seated rest break.  Ambulate ~2239f [facility] CGA-minA with a couple episodes of LOB due to prosthesis not locking, four seated rest breaks. Pt progressing from step to, to step through gait pattern with cane. Verbal cues to keep prosthetic knee locked at times.  Standing B hip abduction with RW and MinA 2x10 each SLS on LLE 10 secs x2 with no AD and Mod A.  Sidestepping with MinA-ModA x2, one episode of prosthetic knee buckling.   07/26/21 Bridges 3x10 SK2C 30s SLR 2x10 minA to hold prosthetic at foot  Sidelying ext 3x10 Sidelying abd 2x10 Hip flexor stretch supine w/leg off table  Step ups 2HHA 4" minA  Walking with  RW  Side steps in // bars  07/20/21  Bridges 2x10 Trunk rotations 65mn Sidelying ext 3x10 Sidelying abd 3x10 Step up 6" w/non prosthetic side 2x10 Walking forwards/backwards in // bars Walking w/RW 2laps Nustep L3, 565ms   07/17/21 Prone stretch [id number] Bridges 2x10 Sidelying hip abd 2x10 minA Sidelying hip ext 2x10 minA STS from elevated table 2x10 Standing hip flexion 2x10 Standing hip abd 2x10 Forwards/Backwards/sideways walking in //   07/13/21  Prone hip flexor stretch x3 minutes Sidelying hip ABD 2x10 B prosthetic on/MinA  Sidelying L hip extension 2x10  B prosthetic on/MinA  Bridges 1x15   WC and prosthetic management with mobility, safety with mobility- more impulsive with transfers today  Standing hip flexion B 1x10 close min guard cues for locking knee on prosthetic Standing hip ABD B 1x10 close in min guard cues for locking knee on prosthetic  Backward walking in // bars min guard for safety Cross midline reaches to L over prosthetic 1x10 for increased WB on residual limb  with practice locking/unlocking prosthetic     07/10/21 Prosthetic donning/doffing  Prone hip flexor stretch  Sidelying hip abd 2x10 Supine bridges 2x10 STS on elevated table  x10 Weight  shifts in // bars  Side steps  Walking w/RW   07/06/21 Prosthetic doning/doffing Weight shifting side to side and forwards Step up taps with 2HHA  Walking in parallel bars  Gait training w/walker  PATIENT EDUCATED ON FOLLOWING PROSTHETIC CARE: Prosthetic wear tolerance: 8 hours  hours/day, 7 days/week Prosthetic weight bearing tolerance: 8  hours Other education  Skin check, Residual limb care, Care of non-amputated limb, Prosthetic cleaning, and Proper wear schedule/adjustment    PATIENT EDUCATION: Education details: exercise form and purpose, prosthetic management, safety with pivot transfers while wearing prosthetic  Person educated: Patient and Spouse Education method: Explanation, Demonstration, and Handouts Education comprehension: needs further education   HOME EXERCISE PROGRAM: [id number]:  CLINICAL IMPRESSION: Pt enters today saying she wishes to be able to ambulate better. Pt doing well with ambulation trials, progressed to [facility] with [facility]. Pt requiring CGA-MinA and verbal cues to make sure prosthesis is locked at knee before advancing extremity. Few episode of prosthetic knee buckling, requiring MinA-ModA to regain balance. Most difficulty with SLS on LLE. Pt would benefit from additional gait training with appropriate AD, balance training and strengthening.      OBJECTIVE IMPAIRMENTS Abnormal gait, decreased activity tolerance, decreased balance, decreased coordination, decreased endurance, decreased knowledge of use of DME, decreased mobility, difficulty walking, decreased ROM, decreased strength, decreased safety awareness, increased fascial restrictions, increased muscle spasms, impaired flexibility, and postural dysfunction.   ACTIVITY LIMITATIONS standing, squatting, stairs, transfers, and locomotion level  PARTICIPATION LIMITATIONS: driving, shopping, community activity, and yard work  PERSONAL FACTORS Time since onset of  injury/illness/exacerbation are also affecting patient's functional outcome.   REHAB POTENTIAL: Good  CLINICAL DECISION MAKING: Evolving/moderate complexity  EVALUATION COMPLEXITY: Moderate   GOALS: Goals reviewed with patient? No  SHORT TERM GOALS: Target date: 08/31/2021    Will be compliant with appropriate HEP  Baseline: Goal status: Met  2.  Will be able to don/doff prosthetic with Mod(I) Baseline:  Goal status: Met  3.  Will be able to perform stand pivot transfers and ambulate household distances with no more than SBA and LRAD  Baseline:  Goal status: Met  4.  Will demonstrate thorough and complete skin check and verbalize appropriate progression of prosthetic wear schedule  Baseline:  Goal status: Ongoing  LONG TERM GOALS: Target date: 09/28/2021    MMT in all weak groups to have improved by one grade  Baseline:  Goal status: progressing  2.  Hip flexor mm flexibility/mobility to be no more than 20% limited to help improve gait mechanics  Baseline:  Goal status: INITIAL  3.  Will be able to ambulate at least 280f with LRAD and no more than SBA before fatiguing  Baseline:  Goal status: INITIAL  4.  Will be able to navigate at least 4 steps with LRAD safely with no more than min guard assist  Baseline:  Goal status: INITIAL  5.  Will be able to ambulate at least 542fover uneven surfaces like grass and gravel with LRAD and no more than min guard assist  Baseline:  Goal status: INITIAL  6.  Will score at least 45 on [place]  Baseline:  Goal status: INITIAL   PLAN: PT FREQUENCY: 2x/week  PT DURATION: 8 weeks  PLANNED INTERVENTIONS: Therapeutic exercises, Therapeutic activity, Neuromuscular re-education, Balance training, Gait training, Patient/Family education, Joint mobilization, Stair training, Prosthetic training, DME instructions, Wheelchair mobility training, Cryotherapy, Moist heat, Taping, Ultrasound, Biofeedback, Manual therapy, and  Re-evaluation  PLAN FOR NEXT SESSION: pre gait tasks, strength, gait training, prosthetic management    MoAndris BaumannDPT 08/03/2021, 11:03 AM

## 2021-08-04 ENCOUNTER — Encounter: Payer: Self-pay | Admitting: Physical Medicine & Rehabilitation

## 2021-08-04 ENCOUNTER — Encounter: Payer: Medicare Other | Attending: Physical Medicine & Rehabilitation | Admitting: Physical Medicine & Rehabilitation

## 2021-08-04 VITALS — BP 128/71 | HR 80 | Ht 63.0 in | Wt 140.0 lb

## 2021-08-04 DIAGNOSIS — S78112A Complete traumatic amputation at level between left hip and knee, initial encounter: Secondary | ICD-10-CM | POA: Insufficient documentation

## 2021-08-04 NOTE — Patient Instructions (Signed)

## 2021-08-04 NOTE — Progress Notes (Signed)
Subjective:    Patient ID: [name], female    DOB: 01-Dec-1940, 81 y.o.   MRN: [id number]  81 y.o. female with history of T2DM with polyneuropathy, breast cancer, CAD, CKD 4, recent COVID infection 01/2021 who was admitted on 02/28/2021 with BLE weakness with difficulty walking, fall, reports of worsening of neuropathy with transient diplopia.  She was found to have elevated troponin and positive H. influenzae markers due to recent infection.  2D echo done showing EF 55 to 60%.  She was briefly treated with IV heparin and as patient is symptomatic cardiology recommended medical management.  MRI brain was negative for acute changes.    She was found to have nonhealing ulcer on left great toe with bilateral heel wounds and rest pain.  LLE angiogram revealed diffuse disease throughout leg without options for revascularization and she was agreeable to undergo left-AKA by Dr. Unk Lightning on 02/13.  Postop had improvement in pain but continued to be limited by weakness with orthostatic changes, tachycardia and was noted to have decline in functional status.  CIR was recommended due to functional decline. Admit date: 03/13/2021 Discharge date: 03/18/2021 HPI  The patient is back she has her left AKA prosthesis she has walked up to 200 feet with minimal assistance and a cane.  She can use a walker without physical assistance.  She is able to independently transfer in and out of her wheelchair. She has been seen by her vascular surgeon and her AKA is healing well.  No right foot issues. She is independent with dressing    Pain Inventory Average Pain 0 Pain Right Now 0 My pain is intermittent, burning, and itching  In the last 24 hours, has pain interfered with the following? General activity 0 Relation with others 0 Enjoyment of life 0 What TIME of day is your pain at its worst? varies Sleep (in general) Good  Pain is worse with: unsure Pain improves with:  only last a minute or two and goes  away by rubbing stump Relief from Meds: 0  Family History  Problem Relation Age of Onset   Kidney disease Mother    Heart disease Mother    Throat cancer Father    Alcoholism Father    Heart attack Father    Lymphoma Brother 43   Heart attack Brother    Diabetes Brother    Hypertension Brother    Cancer Son    Colon cancer Neg Hx    Stroke Neg Hx    Esophageal cancer Neg Hx    Rectal cancer Neg Hx    Stomach cancer Neg Hx    Social History   Socioeconomic History   Marital status: Married    Spouse name: Not on file   Number of children: 3   Years of education: Not on file   Highest education level: Not on file  Occupational History   Occupation: retired-bookkeeping  Tobacco Use   Smoking status: Former    Packs/day: 2.00    Years: 40.00    Total pack years: 80.00    Types: Cigarettes    Quit date: 12/19/1997    Years since quitting: 23.6   Smokeless tobacco: Never  Vaping Use   Vaping Use: Never used  Substance and Sexual Activity   Alcohol use: Yes    Alcohol/week: 1.0 standard drink of alcohol    Types: 1 Glasses of wine per week    Comment: occasiona - maybe once a month   Drug use:  No   Sexual activity: Not Currently    Comment: menarch age 12, P3, HRT X 10 YRS, MENOPAUSE MID 40'S  Other Topics Concern   Not on file  Social History Narrative   One daughter died from suicide at age 44.   Social Determinants of Health   Financial Resource Strain: Low Risk  (12/30/2020)   Overall Financial Resource Strain (CARDIA)    Difficulty of Paying Living Expenses: Not hard at all  Food Insecurity: No Food Insecurity (09/06/2020)   Hunger Vital Sign    Worried About Running Out of Food in the Last Year: Never true    Ran Out of Food in the Last Year: Never true  Transportation Needs: No Transportation Needs (09/06/2020)   PRAPARE - Transportation    Lack of Transportation (Medical): No    Lack of Transportation (Non-Medical): No  Physical Activity:  Insufficiently Active (09/06/2020)   Exercise Vital Sign    Days of Exercise per Week: 2 days    Minutes of Exercise per Session: 20 min  Stress: No Stress Concern Present (09/06/2020)   [employer] - Occupational Stress Questionnaire    Feeling of Stress : Only a little  Social Connections: Moderately Integrated (09/06/2020)   Social Connection and Isolation Panel [NHANES]    Frequency of Communication with Friends and Family: Three times a week    Frequency of Social Gatherings with Friends and Family: Twice a week    Attends Religious Services: More than 4 times per year    Active Member of Clubs or Organizations: No    Attends Club or Organization Meetings: Never    Marital Status: Married   Past Surgical History:  Procedure Laterality Date   ABDOMINAL AORTOGRAM W/LOWER EXTREMITY Bilateral 04/29/2019   Procedure: ABDOMINAL AORTOGRAM W/LOWER EXTREMITY;  Surgeon: Clark, Christopher J, MD;  [facility];  Service: Cardiovascular;  Laterality: Bilateral;   ABDOMINAL AORTOGRAM W/LOWER EXTREMITY Left 07/01/2019   Procedure: ABDOMINAL AORTOGRAM W/ Left LOWER EXTREMITY Runoff;  Surgeon: Clark, Christopher J, MD;  [facility];  Service: Cardiovascular;  Laterality: Left;   ABDOMINAL AORTOGRAM W/LOWER EXTREMITY Left 03/03/2021   Procedure: ABDOMINAL AORTOGRAM W/LOWER EXTREMITY;  Surgeon: Hawken, Thomas N, MD;  [facility];  Service: Cardiovascular;  Laterality: Left;   ABDOMINAL AORTOGRAM W/LOWER EXTREMITY Right 06/12/2021   Procedure: ABDOMINAL AORTOGRAM W/LOWER EXTREMITY;  Surgeon: Cain, Brandon Christopher, MD;  [facility];  Service: Cardiovascular;  Laterality: Right;  LOWER LEG   AMPUTATION Left 03/06/2021   Procedure: LEFT ABOVE KNEE AMPUTATION;  Surgeon: Robins, Joshua E, MD;  [facility];  Service: Vascular;  Laterality: Left;   ANTERIOR AND POSTERIOR REPAIR N/A 04/05/2016   Procedure: ANTERIOR  (CYSTOCELE) AND POSTERIOR REPAIR (RECTOCELE);  Surgeon: Gretchen Adkins, MD;  Location: [facility];  Service: Gynecology;  Laterality: N/A;   AORTIC VALVE REPLACEMENT N/A 05/04/2020   Procedure: AORTIC VALVE REPLACEMENT (AVR) USING INSPIRIS 23MM RESILIA AORTIC VALVE;  Surgeon: Owen, Clarence H, MD;  [facility];  Service: Open Heart Surgery;  Laterality: N/A;   BREAST BIOPSY     BREAST LUMPECTOMY  1983   benign biopsy   BREAST LUMPECTOMY Right 12/29/2010   snbx, ER/PR +, Her2 -, 0/1 node pos.   CARPAL TUNNEL RELEASE Bilateral 9/12,8,12   CHOLECYSTECTOMY     COLONOSCOPY N/A 02/09/2013   Procedure: COLONOSCOPY;  Surgeon: Dora M Brodie, MD;  [facility];  Service: Endoscopy;  Laterality:   N/A;   COLONOSCOPY     CORONARY ANGIOPLASTY     CORONARY ARTERY BYPASS GRAFT N/A 05/04/2020   Procedure: CORONARY ARTERY BYPASS GRAFTING (CABG), ON PUMP, TIMES TWO, USING LEFT INTERNAL MAMMARY ARTERY AND RIGHT ENDOSCOPICALLY HARVESTED GREATER SAPHENOUS VEIN;  Surgeon: Owen, Clarence H, MD;  [facility];  Service: Open Heart Surgery;  Laterality: N/A;   DILATION AND CURETTAGE OF UTERUS     ESOPHAGOGASTRODUODENOSCOPY N/A 02/09/2013   Procedure: ESOPHAGOGASTRODUODENOSCOPY (EGD);  Surgeon: Dora M Brodie, MD;  [facility];  Service: Endoscopy;  Laterality: N/A;   FOOT SPURS     HYSTEROSCOPY WITH D & C N/A 09/11/2012   Procedure: DILATATION AND CURETTAGE ;  Surgeon: Gretchen Adkins, MD;  [facility];  Service: Gynecology;  Laterality: N/A;   KNEE SURGERY  2007   LAPAROSCOPIC ASSISTED VAGINAL HYSTERECTOMY N/A 04/05/2016   Procedure: LAPAROSCOPIC ASSISTED VAGINAL HYSTERECTOMY possible BSO;  Surgeon: Gretchen Adkins, MD;  [facility];  Service: Gynecology;  Laterality: N/A;   LUMBAR LAMINECTOMY/DECOMPRESSION MICRODISCECTOMY N/A 10/20/2014   Procedure: MICRO LUMBER DECOMPRESSION L3-4 L4-5;  Surgeon: Jeffrey Beane, MD;  [facility];  Service: Orthopedics;  Laterality: N/A;   LUMBAR  LAMINECTOMY/DECOMPRESSION MICRODISCECTOMY Bilateral 10/13/2015   Procedure: MICRO LUMBAR DECOMPRESSION L5 - S1 AND REDO DECOMPRESSION L4 - L5 AND REMOVAL OF FACET CYST L4 - L5 2 LEVELS;  Surgeon: Jeffrey Beane, MD;  [facility];  Service: Orthopedics;  Laterality: Bilateral;   PERIPHERAL VASCULAR ATHERECTOMY Right 06/12/2021   Procedure: PERIPHERAL VASCULAR ATHERECTOMY;  Surgeon: Cain, Brandon Christopher, MD;  [facility];  Service: Cardiovascular;  Laterality: Right;  SFA/POP/AT   PERIPHERAL VASCULAR INTERVENTION Right 04/29/2019   Procedure: PERIPHERAL VASCULAR INTERVENTION;  Surgeon: Clark, Christopher J, MD;  [facility];  Service: Cardiovascular;  Laterality: Right;   PERIPHERAL VASCULAR INTERVENTION Right 06/12/2021   Procedure: PERIPHERAL VASCULAR INTERVENTION;  Surgeon: Cain, Brandon Christopher, MD;  [facility];  Service: Cardiovascular;  Laterality: Right;  SFP/POPLITEAL/AT   PORT-A-CATH REMOVAL  04/17/2011   Procedure: REMOVAL PORT-A-CATH;  Surgeon: Matthew Wakefield, MD;  Location: [facility];  Service: General;  Laterality: Left;   PORTACATH PLACEMENT  02/07/2011   Procedure: INSERTION PORT-A-CATH;  Surgeon: Matthew Wakefield, MD;  [facility];  Service: General;  Laterality: N/A;   RIGHT/LEFT HEART CATH AND CORONARY ANGIOGRAPHY N/A 03/17/2020   Procedure: RIGHT/LEFT HEART CATH AND CORONARY ANGIOGRAPHY;  Surgeon: McAlhany, Christopher D, MD;  [facility];  Service: Cardiovascular;  Laterality: N/A;   TEE WITHOUT CARDIOVERSION N/A 05/04/2020   Procedure: TRANSESOPHAGEAL ECHOCARDIOGRAM (TEE);  Surgeon: Owen, Clarence H, MD;  [facility];  Service: Open Heart Surgery;  Laterality: N/A;   UPPER GASTROINTESTINAL ENDOSCOPY     Past Surgical History:  Procedure Laterality Date   ABDOMINAL AORTOGRAM W/LOWER EXTREMITY Bilateral 04/29/2019   Procedure: ABDOMINAL AORTOGRAM W/LOWER EXTREMITY;  Surgeon:  Clark, Christopher J, MD;  [facility];  Service: Cardiovascular;  Laterality: Bilateral;   ABDOMINAL AORTOGRAM W/LOWER EXTREMITY Left 07/01/2019   Procedure: ABDOMINAL AORTOGRAM W/ Left LOWER EXTREMITY Runoff;  Surgeon: Clark, Christopher J, MD;  [facility];  Service: Cardiovascular;  Laterality: Left;   ABDOMINAL AORTOGRAM W/LOWER EXTREMITY Left 03/03/2021   Procedure: ABDOMINAL AORTOGRAM W/LOWER EXTREMITY;  Surgeon: Hawken, Thomas N, MD;  [facility];  Service: Cardiovascular;  Laterality: Left;   ABDOMINAL AORTOGRAM W/LOWER EXTREMITY Right 06/12/2021   Procedure: ABDOMINAL   AORTOGRAM W/LOWER EXTREMITY;  Surgeon: Cain, Brandon Christopher, MD;  [facility];  Service: Cardiovascular;  Laterality: Right;  LOWER LEG   AMPUTATION Left 03/06/2021   Procedure: LEFT ABOVE KNEE AMPUTATION;  Surgeon: Robins, Joshua E, MD;  [facility];  Service: Vascular;  Laterality: Left;   ANTERIOR AND POSTERIOR REPAIR N/A 04/05/2016   Procedure: ANTERIOR (CYSTOCELE) AND POSTERIOR REPAIR (RECTOCELE);  Surgeon: Gretchen Adkins, MD;  [facility];  Service: Gynecology;  Laterality: N/A;   AORTIC VALVE REPLACEMENT N/A 05/04/2020   Procedure: AORTIC VALVE REPLACEMENT (AVR) USING INSPIRIS 23MM RESILIA AORTIC VALVE;  Surgeon: Owen, Clarence H, MD;  [facility];  Service: Open Heart Surgery;  Laterality: N/A;   BREAST BIOPSY     BREAST LUMPECTOMY  1983   benign biopsy   BREAST LUMPECTOMY Right 12/29/2010   snbx, ER/PR +, Her2 -, 0/1 node pos.   CARPAL TUNNEL RELEASE Bilateral 9/12,8,12   CHOLECYSTECTOMY     COLONOSCOPY N/A 02/09/2013   Procedure: COLONOSCOPY;  Surgeon: Dora M Brodie, MD;  [facility];  Service: Endoscopy;  Laterality: N/A;   COLONOSCOPY     CORONARY ANGIOPLASTY     CORONARY ARTERY BYPASS GRAFT N/A 05/04/2020   Procedure: CORONARY ARTERY BYPASS GRAFTING (CABG), ON PUMP, TIMES TWO, USING LEFT INTERNAL MAMMARY ARTERY AND  RIGHT ENDOSCOPICALLY HARVESTED GREATER SAPHENOUS VEIN;  Surgeon: Owen, Clarence H, MD;  [facility];  Service: Open Heart Surgery;  Laterality: N/A;   DILATION AND CURETTAGE OF UTERUS     ESOPHAGOGASTRODUODENOSCOPY N/A 02/09/2013   Procedure: ESOPHAGOGASTRODUODENOSCOPY (EGD);  Surgeon: Dora M Brodie, MD;  [facility];  Service: Endoscopy;  Laterality: N/A;   FOOT SPURS     HYSTEROSCOPY WITH D & C N/A 09/11/2012   Procedure: DILATATION AND CURETTAGE ;  Surgeon: Gretchen Adkins, MD;  [facility];  Service: Gynecology;  Laterality: N/A;   KNEE SURGERY  2007   LAPAROSCOPIC ASSISTED VAGINAL HYSTERECTOMY N/A 04/05/2016   Procedure: LAPAROSCOPIC ASSISTED VAGINAL HYSTERECTOMY possible BSO;  Surgeon: Gretchen Adkins, MD;  [facility];  Service: Gynecology;  Laterality: N/A;   LUMBAR LAMINECTOMY/DECOMPRESSION MICRODISCECTOMY N/A 10/20/2014   Procedure: MICRO LUMBER DECOMPRESSION L3-4 L4-5;  Surgeon: Jeffrey Beane, MD;  [facility];  Service: Orthopedics;  Laterality: N/A;   LUMBAR LAMINECTOMY/DECOMPRESSION MICRODISCECTOMY Bilateral 10/13/2015   Procedure: MICRO LUMBAR DECOMPRESSION L5 - S1 AND REDO DECOMPRESSION L4 - L5 AND REMOVAL OF FACET CYST L4 - L5 2 LEVELS;  Surgeon: Jeffrey Beane, MD;  [facility];  Service: Orthopedics;  Laterality: Bilateral;   PERIPHERAL VASCULAR ATHERECTOMY Right 06/12/2021   Procedure: PERIPHERAL VASCULAR ATHERECTOMY;  Surgeon: Cain, Brandon Christopher, MD;  [facility];  Service: Cardiovascular;  Laterality: Right;  SFA/POP/AT   PERIPHERAL VASCULAR INTERVENTION Right 04/29/2019   Procedure: PERIPHERAL VASCULAR INTERVENTION;  Surgeon: Clark, Christopher J, MD;  [facility];  Service: Cardiovascular;  Laterality: Right;   PERIPHERAL VASCULAR INTERVENTION Right 06/12/2021   Procedure: PERIPHERAL VASCULAR INTERVENTION;  Surgeon: Cain, Brandon Christopher, MD;  [facility];  Service: Cardiovascular;   Laterality: Right;  SFP/POPLITEAL/AT   PORT-A-CATH REMOVAL  04/17/2011   Procedure: REMOVAL PORT-A-CATH;  Surgeon: Matthew Wakefield, MD;  Location: [facility];  Service: General;  Laterality: Left;   PORTACATH PLACEMENT  02/07/2011   Procedure: INSERTION PORT-A-CATH;  Surgeon: Matthew Wakefield, MD;  [facility];  Service: General;  Laterality: N/A;   RIGHT/LEFT HEART CATH AND CORONARY ANGIOGRAPHY   N/A 03/17/2020   Procedure: RIGHT/LEFT HEART CATH AND CORONARY ANGIOGRAPHY;  Surgeon: McAlhany, Christopher D, MD;  [facility];  Service: Cardiovascular;  Laterality: N/A;   TEE WITHOUT CARDIOVERSION N/A 05/04/2020   Procedure: TRANSESOPHAGEAL ECHOCARDIOGRAM (TEE);  Surgeon: Owen, Clarence H, MD;  [facility];  Service: Open Heart Surgery;  Laterality: N/A;   UPPER GASTROINTESTINAL ENDOSCOPY     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, inv mammary, in situ,ER/PR +,HER2 -   Cancer (HCC)    Carcinoma of breast treated with adjuvant chemotherapy (HCC)    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HCC)    Claudication (HCC)    Coronary artery disease    stent 2007   Depression    Diabetes mellitus    Difficulty sleeping    Diverticulosis 12/10/03   Elevated cholesterol    Generalized weakness    GERD (gastroesophageal reflux disease)    Heart murmur    Hepatitis    as an infant   HSV-1 (herpes simplex virus 1) infection    Hyperlipidemia    Hypertension    Hypothyroidism    Osteoarthritis    PAD (peripheral artery disease) (HCC)    Personal history of chemotherapy    Personal history of radiation therapy    Pneumonia    2014 september and March 2022   Rheumatoid arthritis (HCC)    S/P aortic valve replacement with bioprosthetic valve 05/04/2020   [facility] Inspiris Resilia stented  bovine pericardial tissue valve, size 23 mm   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   LIMA to D3, SVG to PDA, EVH via right thigh   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   Dr Jyothi Mann   Spinal stenosis    Thyroid disease    BP 128/71   Pulse 80   Ht 5' 3" (1.6 m)   Wt 140 lb (63.5 kg) Comment: with right  prostic  BMI 24.80 kg/m   Opioid Risk Score:   Fall Risk Score:  `1  Depression screen PHQ 2/9     05/05/2021    2:24 PM 03/29/2021   11:22 AM 02/02/2021    8:33 AM 09/06/2020   10:07 AM 07/26/2020    9:58 AM 08/19/2019    3:16 PM 03/31/2018   10:58 AM  Depression screen PHQ 2/9  Decreased Interest 0 0 0 0 0 0 0  Down, Depressed, Hopeless 0 0 0 0 0 0 0  PHQ - 2 Score 0 0 0 0 0 0 0  Altered sleeping 1  0      Tired, decreased energy 0  3      Change in appetite 0  3      Feeling bad or failure about yourself  0  0      Trouble concentrating 0  0      Moving slowly or fidgety/restless 0  0      Suicidal thoughts 0  0      PHQ-9 Score 1  6      Difficult doing work/chores   Not difficult at all        Review of Systems  Musculoskeletal:  Positive for gait problem.       Left stump    All other systems reviewed and are negative.      Objective:   Physical Exam Vitals and nursing note reviewed.  Constitutional:      Appearance: She is normal weight.  HENT:     Head: Normocephalic and atraumatic.  Eyes:     Extraocular Movements: Extraocular movements intact.     Conjunctiva/sclera: Conjunctivae normal.     Pupils: Pupils are equal, round, and reactive to light.  Musculoskeletal:     Comments: No pain with upper extremity or lower extremity range of motion. Is able to go from sit to stand without physical assistance.  She has some difficulty with knee stability prior to lockout.  Skin:    General: Skin is warm and dry.  Neurological:     Mental Status: She is alert and oriented to person, place, and time.  Psychiatric:        Mood and Affect: Mood normal.        Behavior:  Behavior normal.           Assessment & Plan:  #1.  Status post left AKA peripheral vascular disease, doing well with her prosthetic rehabilitation.  She is followed by vascular surgery as well as PCP.  She is essentially independent with her basic care.  Pain is under good control.  As discussed with patient and husband may return to [facility] clinic on a as needed basis.  

## 2021-08-08 ENCOUNTER — Telehealth: Payer: Self-pay

## 2021-08-08 ENCOUNTER — Ambulatory Visit: Payer: Medicare Other

## 2021-08-08 DIAGNOSIS — R262 Difficulty in walking, not elsewhere classified: Secondary | ICD-10-CM

## 2021-08-08 DIAGNOSIS — M6281 Muscle weakness (generalized): Secondary | ICD-10-CM

## 2021-08-08 DIAGNOSIS — R2681 Unsteadiness on feet: Secondary | ICD-10-CM

## 2021-08-08 DIAGNOSIS — R2689 Other abnormalities of gait and mobility: Secondary | ICD-10-CM

## 2021-08-08 NOTE — Progress Notes (Signed)
Chronic Care Management Pharmacy Assistant   Name: [name]  MRN: [id number] DOB: 19-Feb-1940  Reason for Encounter:Diabetes Disease State Call.   Recent office visits:  07/03/2021 Dr. Gena Fray MD (PCP) No Medication Changes noted, Return in about 3 months   Recent consult visits:  08/04/2021 Dr. Letta Pate MD (Physical Medicine) Stop Amlodipine 07/28/2021 Dr. Johnsie Cancel MD (Cardiology) No Medication Changes noted 07/27/2021 Andris Baumann PT (Rehabilitation) No Medication Changes noted 07/26/2021 Dr. Donzetta Matters MD (Vascular Surgery) No Medication Changes Noted 07/20/2021  Andris Baumann PT (Rehabilitation) No Medication Changes noted 07/17/2021  Andris Baumann PT (Rehabilitation) No Medication Changes noted 07/13/2021 Deniece Ree PT (Rehabilitation) No Medication Changes noted 07/10/2021 Andris Baumann PT (Rehabilitation) No Medication Changes noted 07/07/2021 Andris Baumann PT (Rehabilitation) No Medication Changes noted 07/04/2021 Deniece Ree PT (Rehabilitation) No Medication Changes noted 06/27/2021 Corrina Baglia PA-C (Vascular Surgery) No Medication Changes noted, Follow up in 4 weeks  05/31/2021 Laurence Slate PA-C (Vascular Surgery) No medication Changes noted 05/24/2021 Dr. Johnsie Cancel MD  (Cardiology) Start Amlodipine 10 mg daily  05/05/2021 Dr. Letta Pate MD (Physical Medicine) No Medication Changes noted, Return in about 3 months   Hospital visits:  Medication Reconciliation was completed by comparing discharge summary, patient's EMR and Pharmacy list, and upon discussion with patient.  Admitted to the hospital on 06/12/2021 due to PAD (peripheral artery disease). Discharge date was 06/12/2021. Discharged from [facility]?Medications Started at [facility] Discharge:?? -started None ID  Medication Changes at Hospital Discharge: -Changed None ID  Medications Discontinued at Hospital Discharge: -Stopped None ID  Medications that remain the same after Hospital  Discharge:??  -All other medications will remain the same.    Medications: Outpatient Encounter Medications as of 08/08/2021  Medication Sig   Accu-Chek FastClix Lancets MISC USE TO CHECK BLOOD SUGARS 3 TIMES DAILY   ascorbic acid (VITAMIN C) 250 MG tablet Take 1 tablet (250 mg total) by mouth 2 (two) times daily.   aspirin EC 81 MG tablet Take 81 mg by mouth in the morning.   blood glucose meter kit and supplies Dispense based on patient and insurance preference. Use up to four times daily as directed. (FOR ICD-10 E10.9, E11.9).   clopidogrel (PLAVIX) 75 MG tablet Take 1 tablet (75 mg total) by mouth daily.   Evolocumab (REPATHA SURECLICK) 409 MG/ML SOAJ Inject 1 pen into the skin every 14 (fourteen) days. (Patient taking differently: Inject 140 mg into the skin every 14 (fourteen) days.)   glipiZIDE (GLUCOTROL XL) 10 MG 24 hr tablet Take 1 tablet (10 mg total) by mouth daily with breakfast.   glucose blood (ACCU-CHEK GUIDE) test strip TEST ONCE DAILY   Golimumab (SIMPONI ARIA IV) Inject into the vein every 2 (two) months.   icosapent Ethyl (VASCEPA) 1 g capsule Take 2 capsules (2 g total) by mouth 2 (two) times daily.   insulin glargine (LANTUS) 100 UNIT/ML Solostar Pen Inject 15 Units into the skin 2 (two) times daily.   Insulin Pen Needle (CAREFINE PEN NEEDLES) 32G X 6 MM MISC 1 application by Does not apply route 2 (two) times daily.   levothyroxine (SYNTHROID) 125 MCG tablet Take 125 mcg by mouth daily before breakfast.   metoprolol tartrate (LOPRESSOR) 50 MG tablet Take 1 tablet (50 mg total) by mouth 2 (two) times daily.   pantoprazole (PROTONIX) 40 MG tablet Take 1 tablet (40 mg total) by mouth daily before breakfast.   PARoxetine (PAXIL) 20 MG tablet Take 1 tablet (20 mg  total) by mouth in the morning.   Semaglutide,0.25 or 0.5MG/DOS, 2 MG/1.5ML SOPN Inject 0.5 mg into the skin every Tuesday.   valACYclovir (VALTREX) 1000 MG tablet Take 1 tablet (1,000 mg total) by mouth daily as  needed (Fever Blisters).   No facility-administered encounter medications on file as of 08/08/2021.    Care Gaps: Hepatitis C Screening Tetanus Vaccine Shingrix Vaccine     Star Rating Drugs: Glipizide 10 mg last filled 07/14/2021 90 day supply at [facility]. Repatha 140 mg last filled 07/07/2021 28 day supply at [facility].     Medication Fill Gaps: None ID  Recent Relevant Labs: Lab Results  Component Value Date/Time   HGBA1C 7.0 (H) 07/03/2021 09:50 AM   HGBA1C 11.6 (H) 03/01/2021 01:28 AM   HGBA1C 10.0 05/22/2018 01:59 PM   HGBA1C 8.4 03/17/2018 11:15 AM   MICROALBUR 33.6 (H) 11/02/2020 09:40 AM   MICROALBUR 25.9 (H) 11/20/2019 10:02 AM   MICROALBUR 150 06/03/2017 08:28 AM    Kidney Function Lab Results  Component Value Date/Time   CREATININE 1.70 (H) 06/12/2021 08:45 AM   CREATININE 1.50 (H) 03/16/2021 11:35 AM   CREATININE 1.4 (H) 02/21/2016 10:35 AM   CREATININE 1.4 (H) 02/21/2015 12:21 PM   GFR 28.59 (L) 02/21/2021 12:09 PM   GFRNONAA 35 (L) 03/16/2021 11:35 AM   GFRAA 32 (L) 03/11/2020 09:13 AM    Current antihyperglycemic regimen:  Glipizide XL 10 mg daily Lantus 15 units into the skin 2 times daily Semaglutide 0.5 mg weekly on Tuesday  What recent interventions/DTPs have been made to improve glycemic control:  None ID  Have there been any recent hospitalizations or ED visits since last visit with CPP? Yes  Patient denies hypoglycemic symptoms, including Pale, Sweaty, Shaky, Hungry, Nervous/irritable, and Vision changes  Patient denies hyperglycemic symptoms, including blurry vision, excessive thirst, fatigue, polyuria, and weakness  How often are you checking your blood sugar?  Patient reports she checks her blood sugar once in awhile.  What are your blood sugars ranging?  Patient states her blood sugar ranges around 89-140.  During the week, how often does your blood glucose drop below 70? Never  Are you checking your feet  daily/regularly?  None ID  Adherence Review: Is the patient currently on a STATIN medication? No Is the patient currently on ACE/ARB medication? No Does the patient have >5 day gap between last estimated fill dates? No  Patient agree to schedule a telephone follow up with the clinical pharmacist for CCM on 09/14/2021 at 11:00 am.  [place] Pharmacist Assistant [phone]

## 2021-08-08 NOTE — Therapy (Signed)
OUTPATIENT PHYSICAL THERAPY PROSTHETICS TREATMENT   Patient Name: [name] MRN: [id number] DOB:05/18/1940, 81 y.o., female Today's Date: 08/08/2021  PCP: Arlester Marker  REFERRING PROVIDER: Charlett Blake, MD    PT End of Session - 08/08/21 1315     Visit Number 9    Authorization Type [employer]    Authorization Time Period 07/04/21-08/29/24    Progress Note Due on Visit 10    PT Start Time 1315    PT Stop Time 1400    PT Time Calculation (min) 45 min    Equipment Utilized During Treatment Gait belt    Activity Tolerance Patient tolerated treatment well    Behavior During Therapy WFL for tasks assessed/performed                    Past Medical History:  Diagnosis Date   Allergy    Anemia    childhood   Anxiety    Aortic stenosis    Arterial stenosis (HCC)    mesenteric   Arthritis    Breast cancer ([place]) 12/05/10   R breast, inv mammary, in situ,ER/PR +,HER2 -   Cancer ([place])    Carcinoma of breast treated with adjuvant chemotherapy ([place])    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[place])    Claudication ([place])    Coronary artery disease    stent 2007   Depression    Diabetes mellitus    Difficulty sleeping    Diverticulosis 12/10/03   Elevated cholesterol    Generalized weakness    GERD (gastroesophageal reflux disease)    Heart murmur    Hepatitis    as an infant   HSV-1 (herpes simplex virus 1) infection    Hyperlipidemia    Hypertension    Hypothyroidism    Osteoarthritis    PAD (peripheral artery disease) ([place])    Personal history of chemotherapy    Personal history of radiation therapy    Pneumonia    2014 september and March 2022   Rheumatoid arthritis ([place])    S/P aortic valve replacement with bioprosthetic valve 05/04/2020   [facility] Inspiris Resilia stented bovine pericardial tissue valve, size 23 mm   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   LIMA to D3, SVG to PDA, EVH via right thigh   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   Dr [name]    Spinal stenosis    Thyroid disease    Past Surgical History:  Procedure Laterality Date   ABDOMINAL AORTOGRAM W/LOWER EXTREMITY Bilateral 04/29/2019   Procedure: ABDOMINAL AORTOGRAM W/LOWER EXTREMITY;  Surgeon: Marty Heck, MD;  Location: [facility];  Service: Cardiovascular;  Laterality: Bilateral;   ABDOMINAL AORTOGRAM W/LOWER EXTREMITY Left 07/01/2019   Procedure: ABDOMINAL AORTOGRAM W/ Left LOWER EXTREMITY [place];  Surgeon: Marty Heck, MD;  Location: [facility];  Service: Cardiovascular;  Laterality: Left;   ABDOMINAL AORTOGRAM W/LOWER EXTREMITY Left 03/03/2021   Procedure: ABDOMINAL AORTOGRAM W/LOWER EXTREMITY;  Surgeon: Cherre Robins, MD;  Location: [facility];  Service: Cardiovascular;  Laterality: Left;   ABDOMINAL AORTOGRAM W/LOWER EXTREMITY Right 06/12/2021   Procedure: ABDOMINAL AORTOGRAM W/LOWER EXTREMITY;  Surgeon: Waynetta Sandy, MD;  Location: [facility];  Service: Cardiovascular;  Laterality: Right;  LOWER LEG   AMPUTATION Left 03/06/2021   Procedure: LEFT ABOVE KNEE AMPUTATION;  Surgeon: [name], MD;  Location: [facility];  Service: Vascular;  Laterality: Left;   ANTERIOR AND POSTERIOR  REPAIR N/A 04/05/2016   Procedure: ANTERIOR (CYSTOCELE) AND POSTERIOR REPAIR (RECTOCELE);  Surgeon: [name], MD;  Location: [facility];  Service: Gynecology;  Laterality: N/A;   AORTIC VALVE REPLACEMENT N/A 05/04/2020   Procedure: AORTIC VALVE REPLACEMENT (AVR) USING INSPIRIS 23MM RESILIA AORTIC VALVE;  Surgeon: Rexene Alberts, MD;  Location: [facility];  Service: Open Heart Surgery;  Laterality: N/A;   BREAST BIOPSY     BREAST LUMPECTOMY  1983   benign biopsy   BREAST LUMPECTOMY Right 12/29/2010   snbx, ER/PR +, Her2 -, 0/1 node pos.   CARPAL TUNNEL RELEASE Bilateral 9/12,8,12   CHOLECYSTECTOMY     COLONOSCOPY N/A 02/09/2013   Procedure: COLONOSCOPY;  Surgeon: [name], MD;  [facility]: [facility];  Service: Endoscopy;   Laterality: N/A;   COLONOSCOPY     CORONARY ANGIOPLASTY     CORONARY ARTERY BYPASS GRAFT N/A 05/04/2020   Procedure: CORONARY ARTERY BYPASS GRAFTING (CABG), ON PUMP, TIMES TWO, USING LEFT INTERNAL MAMMARY ARTERY AND RIGHT ENDOSCOPICALLY HARVESTED GREATER SAPHENOUS VEIN;  Surgeon: Rexene Alberts, MD;  Location: [facility];  Service: Open Heart Surgery;  Laterality: N/A;   DILATION AND CURETTAGE OF UTERUS     ESOPHAGOGASTRODUODENOSCOPY N/A 02/09/2013   Procedure: ESOPHAGOGASTRODUODENOSCOPY (EGD);  Surgeon: [name], MD;  Location: [name];  Service: Endoscopy;  Laterality: N/A;   FOOT SPURS     HYSTEROSCOPY WITH D & C N/A 09/11/2012   Procedure: DILATATION AND CURETTAGE ;  Surgeon: [name], MD;  Location: [facility];  Service: Gynecology;  Laterality: N/A;   KNEE SURGERY  2007   LAPAROSCOPIC ASSISTED VAGINAL HYSTERECTOMY N/A 04/05/2016   Procedure: LAPAROSCOPIC ASSISTED VAGINAL HYSTERECTOMY possible BSO;  Surgeon: [name], MD;  Location: [facility];  Service: Gynecology;  Laterality: N/A;   LUMBAR LAMINECTOMY/DECOMPRESSION MICRODISCECTOMY N/A 10/20/2014   Procedure: MICRO LUMBER DECOMPRESSION L3-4 L4-5;  Surgeon: [name], MD;  [facility];  Service: Orthopedics;  Laterality: N/A;   LUMBAR LAMINECTOMY/DECOMPRESSION MICRODISCECTOMY Bilateral 10/13/2015   Procedure: MICRO LUMBAR DECOMPRESSION L5 - S1 AND REDO DECOMPRESSION L4 - L5 AND REMOVAL OF FACET CYST L4 - L5 2 LEVELS;  Surgeon: [name], MD;  [facility];  Service: Orthopedics;  Laterality: Bilateral;   PERIPHERAL VASCULAR ATHERECTOMY Right 06/12/2021   Procedure: PERIPHERAL VASCULAR ATHERECTOMY;  Surgeon: Waynetta Sandy, MD;  Location: [facility];  Service: Cardiovascular;  Laterality: Right;  SFA/POP/AT   PERIPHERAL VASCULAR INTERVENTION Right 04/29/2019   Procedure: PERIPHERAL VASCULAR INTERVENTION;  Surgeon: Marty Heck, MD;  Location: [facility];  Service: Cardiovascular;   Laterality: Right;   PERIPHERAL VASCULAR INTERVENTION Right 06/12/2021   Procedure: PERIPHERAL VASCULAR INTERVENTION;  Surgeon: Waynetta Sandy, MD;  Location: [facility];  Service: Cardiovascular;  Laterality: Right;  SFP/POPLITEAL/AT   PORT-A-CATH REMOVAL  04/17/2011   Procedure: REMOVAL PORT-A-CATH;  Surgeon: [name], MD;  Location: [facility];  Service: General;  Laterality: Left;   PORTACATH PLACEMENT  02/07/2011   Procedure: INSERTION PORT-A-CATH;  Surgeon: [name], MD;  [facility];  Service: General;  Laterality: N/A;   RIGHT/LEFT HEART CATH AND CORONARY ANGIOGRAPHY N/A 03/17/2020   Procedure: RIGHT/LEFT HEART CATH AND CORONARY ANGIOGRAPHY;  Surgeon: Burnell Blanks, MD;  Location: [facility];  Service: Cardiovascular;  Laterality: N/A;   TEE WITHOUT CARDIOVERSION N/A 05/04/2020   Procedure: TRANSESOPHAGEAL ECHOCARDIOGRAM (TEE);  Surgeon: Rexene Alberts, MD;  Location: [facility];  Service: Open Heart Surgery;  Laterality: N/A;   UPPER GASTROINTESTINAL ENDOSCOPY     Patient Active Problem List   Diagnosis Date Noted   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[place]) 03/13/2021   Sinus tachycardia 03/11/2021   Elevated troponin 03/08/2021   Stroke-like symptoms 03/08/2021   Anemia of chronic disease 03/08/2021   Bilateral lower extremity edema 02/21/2021   Acute supraglottitis with epiglottitis 02/05/2021   Epiglottitis 02/05/2021   Reactive airway disease 01/20/2021   Diabetic peripheral neuropathy ([place]) 12/19/2020   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   Primary insomnia 11/02/2020   Irritation of eyelid 07/26/2020   S/P aortic valve replacement with bioprosthetic valve 05/04/2020   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   Peripheral artery disease (HCC)    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[place]) 04/07/2019   Low back pain 02/03/2019   Scoliosis deformity of spine 02/03/2019   Osteoarthritis of  hip 11/17/2018   Claudication ([place])    Chronic headaches 11/12/2017   Degeneration of lumbar intervertebral disc 06/13/2017   Hyperkalemia 06/03/2017   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[place])    Mass of left adrenal gland ([place]) 04/19/2017   Spinal stenosis of lumbar region 10/20/2014   Hyperlipidemia 03/16/2013   Dense breasts 12/22/2012   Anxiety state 04/16/2012   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/16/2012   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   Osteoarthritis of knee 04/16/2012   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 female breast ([place]) 12/20/2010   History of breast cancer 12/05/2010   Depression 09/14/2008   GERD 09/14/2008   Type 2 diabetes mellitus with neurologic complication, with long-term current use of insulin ([place]) 05/20/2008   Coronary atherosclerosis 04/17/2007   Hypothyroidism 04/03/2007    ONSET DATE: 05/29/2021   REFERRING DIAG: Y56.389H (ICD-10-CM) - 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[place])   THERAPY DIAG:  Difficulty in walking, not elsewhere classified  Unsteadiness on feet  Other abnormalities of gait and mobility  Muscle weakness (generalized)  Rationale for Evaluation and Treatment Rehabilitation  SUBJECTIVE:   SUBJECTIVE STATEMENT: Good   Pt accompanied by: significant other  PERTINENT HISTORY: PAD, GERD, CKD, DM with neuropathy, OA and RA, scoliosis, inflammatory polyarthropathy, breast CA, HLD, depression, CABG, Lx surgery, knee surgery    PAIN:  Are you having pain? No   PRECAUTIONS: Fall and Other: AKA   WEIGHT BEARING RESTRICTIONS No  FALLS: Has patient fallen in last 6 months? Yes. Number of falls 2  LIVING ENVIRONMENT: Lives with: lives with their spouse Lives in: House/apartment Home Access: [place]: One level Stairs: No Has following equipment at home: Environmental consultant - 2 wheeled, Wheelchair (manual), Shower bench, bed side commode, Grab bars, and Ramped  entry  PLOF: Independent, Independent with basic ADLs, Independent with gait, and Independent with transfers  PATIENT GOALS get up and get going      TODAY'S TREATMENT:  08/08/21 Nustep with ramp ups for 30 intervals Ambulate with RW 4 laps needs rest after 2, walking backwards with RW   SL stance LLE holding on to RW on 1 side, at best 9 seconds Side steps 2" with [facility]   08/03/21 NuStep L3 x 4 min  Ambulate ~58f. With RW and CGA, one seated rest break.  Ambulate ~2249f [facility]-minA with a couple episodes of LOB due to prosthesis not locking, four seated rest breaks. Pt progressing from step to, to step through gait pattern with cane. Verbal cues to keep  prosthetic knee locked at times.  Standing B hip abduction with RW and MinA 2x10 each SLS on LLE 10 secs x2 with no AD and Mod A.  Sidestepping with MinA-ModA x2, one episode of prosthetic knee buckling.   07/26/21 Bridges 3x10 SK2C 30s SLR 2x10 minA to hold prosthetic at foot  Sidelying ext 3x10 Sidelying abd 2x10 Hip flexor stretch supine w/leg off table  Step ups 2HHA 4" minA  Walking with RW  Side steps in // bars  07/20/21  Bridges 2x10 Trunk rotations 59mn Sidelying ext 3x10 Sidelying abd 3x10 Step up 6" w/non prosthetic side 2x10 Walking forwards/backwards in // bars Walking w/RW 2laps Nustep L3, 551ms   07/17/21 Prone stretch 26m2m Bridges 2x10 Sidelying hip abd 2x10 minA Sidelying hip ext 2x10 minA STS from elevated table 2x10 Standing hip flexion 2x10 Standing hip abd 2x10 Forwards/Backwards/sideways walking in //   07/13/21  Prone hip flexor stretch x3 minutes Sidelying hip ABD 2x10 B prosthetic on/MinA  Sidelying L hip extension 2x10  B prosthetic on/MinA  Bridges 1x15   WC and prosthetic management with mobility, safety with mobility- more impulsive with transfers today  Standing hip flexion B 1x10 close min guard cues for locking knee on prosthetic Standing hip ABD B 1x10 close in  min guard cues for locking knee on prosthetic  Backward walking in // bars min guard for safety Cross midline reaches to L over prosthetic 1x10 for increased WB on residual limb  with practice locking/unlocking prosthetic     07/10/21 Prosthetic donning/doffing  Prone hip flexor stretch  Sidelying hip abd 2x10 Supine bridges 2x10 STS on elevated table  x10 Weight shifts in // bars  Side steps  Walking w/RW   07/06/21 Prosthetic doning/doffing Weight shifting side to side and forwards Step up taps with 2HHA  Walking in parallel bars  Gait training w/walker  PATIENT EDUCATED ON FOLLOWING PROSTHETIC CARE: Prosthetic wear tolerance: 8 hours  hours/day, 7 days/week Prosthetic weight bearing tolerance: 8  hours Other education  Skin check, Residual limb care, Care of non-amputated limb, Prosthetic cleaning, and Proper wear schedule/adjustment    PATIENT EDUCATION: Education details: exercise form and purpose, prosthetic management, safety with pivot transfers while wearing prosthetic  Person educated: Patient and Spouse Education method: Explanation, Demonstration, and Handouts Education comprehension: needs further education   HOME EXERCISE PROGRAM: [phone]:  CLINICAL IMPRESSION: We did lots of walking/gait training today. Patient has difficulty ambulating with sharp turns, requires cues to prevent scissoring and feet from tripping over each other. Backwards walking with RW, 1 episode of prosthetic buckling requires modA to regain balance. Able to move faster but still requires minA due to some LOB and improper foot placement. Continue working on balance and weight shifting onto prosthetic leg.   OBJECTIVE IMPAIRMENTS Abnormal gait, decreased activity tolerance, decreased balance, decreased coordination, decreased endurance, decreased knowledge of use of DME, decreased mobility, difficulty walking, decreased ROM, decreased strength, decreased safety awareness,  increased fascial restrictions, increased muscle spasms, impaired flexibility, and postural dysfunction.   ACTIVITY LIMITATIONS standing, squatting, stairs, transfers, and locomotion level  PARTICIPATION LIMITATIONS: driving, shopping, community activity, and yard work  PERSONAL FACTORS Time since onset of injury/illness/exacerbation are also affecting patient's functional outcome.   REHAB POTENTIAL: Good  CLINICAL DECISION MAKING: Evolving/moderate complexity  EVALUATION COMPLEXITY: Moderate   GOALS: Goals reviewed with patient? No  SHORT TERM GOALS: Target date: 09/05/2021    Will be compliant with appropriate HEP  Baseline: Goal status: Met  2.  Will be able to don/doff prosthetic with Mod(I) Baseline:  Goal status: Met  3.  Will be able to perform stand pivot transfers and ambulate household distances with no more than SBA and LRAD  Baseline:  Goal status: Met  4.  Will demonstrate thorough and complete skin check and verbalize appropriate progression of prosthetic wear schedule  Baseline:  Goal status: Ongoing   LONG TERM GOALS: Target date: 10/03/2021    MMT in all weak groups to have improved by one grade  Baseline:  Goal status: progressing  2.  Hip flexor mm flexibility/mobility to be no more than 20% limited to help improve gait mechanics  Baseline:  Goal status: INITIAL  3.  Will be able to ambulate at least 277f with LRAD and no more than SBA before fatiguing  Baseline:  Goal status: INITIAL  4.  Will be able to navigate at least 4 steps with LRAD safely with no more than min guard assist  Baseline:  Goal status: INITIAL  5.  Will be able to ambulate at least 51fover uneven surfaces like grass and gravel with LRAD and no more than min guard assist  Baseline:  Goal status: INITIAL  6.  Will score at least 45 on [place]  Baseline:  Goal status: INITIAL   PLAN: PT FREQUENCY: 2x/week  PT DURATION: 8 weeks  PLANNED INTERVENTIONS: Therapeutic  exercises, Therapeutic activity, Neuromuscular re-education, Balance training, Gait training, Patient/Family education, Joint mobilization, Stair training, Prosthetic training, DME instructions, Wheelchair mobility training, Cryotherapy, Moist heat, Taping, Ultrasound, Biofeedback, Manual therapy, and Re-evaluation  PLAN FOR NEXT SESSION: pre gait tasks, strength, gait training, prosthetic management    MoAndris BaumannDPT 08/08/2021, 2:02 PM

## 2021-08-10 NOTE — Therapy (Signed)
OUTPATIENT PHYSICAL THERAPY PROSTHETICS TREATMENT   Patient Name: [name] MRN: [id number] DOB:07-23-1940, 81 y.o., female Today's Date: 08/11/2021  PCP: Arlester Marker  REFERRING PROVIDER: Charlett Blake, MD    PT End of Session - 08/11/21 1022     Visit Number 10    Authorization Type [employer]    Authorization Time Period 07/04/21-08/29/24    Progress Note Due on Visit 10    PT Start Time 1017    PT Stop Time 1100    PT Time Calculation (min) 43 min    Equipment Utilized During Treatment Gait belt    Activity Tolerance Patient tolerated treatment well    Behavior During Therapy WFL for tasks assessed/performed                     Past Medical History:  Diagnosis Date   Allergy    Anemia    childhood   Anxiety    Aortic stenosis    Arterial stenosis (HCC)    mesenteric   Arthritis    Breast cancer ([place]) 12/05/10   R breast, inv mammary, in situ,ER/PR +,HER2 -   Cancer ([place])    Carcinoma of breast treated with adjuvant chemotherapy ([place])    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[place])    Claudication ([place])    Coronary artery disease    stent 2007   Depression    Diabetes mellitus    Difficulty sleeping    Diverticulosis 12/10/03   Elevated cholesterol    Generalized weakness    GERD (gastroesophageal reflux disease)    Heart murmur    Hepatitis    as an infant   HSV-1 (herpes simplex virus 1) infection    Hyperlipidemia    Hypertension    Hypothyroidism    Osteoarthritis    PAD (peripheral artery disease) ([place])    Personal history of chemotherapy    Personal history of radiation therapy    Pneumonia    2014 september and March 2022   Rheumatoid arthritis ([place])    S/P aortic valve replacement with bioprosthetic valve 05/04/2020   [facility] Inspiris Resilia stented bovine pericardial tissue valve, size 23 mm   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   LIMA to D3, SVG to PDA, EVH via right thigh   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   Dr [name]   Spinal stenosis    Thyroid disease    Past Surgical History:  Procedure Laterality Date   ABDOMINAL AORTOGRAM W/LOWER EXTREMITY Bilateral 04/29/2019   Procedure: ABDOMINAL AORTOGRAM W/LOWER EXTREMITY;  Surgeon: Marty Heck, MD;  Location: [facility];  Service: Cardiovascular;  Laterality: Bilateral;   ABDOMINAL AORTOGRAM W/LOWER EXTREMITY Left 07/01/2019   Procedure: ABDOMINAL AORTOGRAM W/ Left LOWER EXTREMITY [place];  Surgeon: Marty Heck, MD;  Location: [facility];  Service: Cardiovascular;  Laterality: Left;   ABDOMINAL AORTOGRAM W/LOWER EXTREMITY Left 03/03/2021   Procedure: ABDOMINAL AORTOGRAM W/LOWER EXTREMITY;  Surgeon: Cherre Robins, MD;  Location: [facility];  Service: Cardiovascular;  Laterality: Left;   ABDOMINAL AORTOGRAM W/LOWER EXTREMITY Right 06/12/2021   Procedure: ABDOMINAL AORTOGRAM W/LOWER EXTREMITY;  Surgeon: Waynetta Sandy, MD;  Location: [facility];  Service: Cardiovascular;  Laterality: Right;  LOWER LEG   AMPUTATION Left 03/06/2021   Procedure: LEFT ABOVE KNEE AMPUTATION;  Surgeon: [name], MD;  Location: [facility];  Service: Vascular;  Laterality: Left;   ANTERIOR AND  POSTERIOR REPAIR N/A 04/05/2016   Procedure: ANTERIOR (CYSTOCELE) AND POSTERIOR REPAIR (RECTOCELE);  Surgeon: [name], MD;  Location: [facility];  Service: Gynecology;  Laterality: N/A;   AORTIC VALVE REPLACEMENT N/A 05/04/2020   Procedure: AORTIC VALVE REPLACEMENT (AVR) USING INSPIRIS 23MM RESILIA AORTIC VALVE;  Surgeon: Rexene Alberts, MD;  Location: [facility];  Service: Open Heart Surgery;  Laterality: N/A;   BREAST BIOPSY     BREAST LUMPECTOMY  1983   benign biopsy   BREAST LUMPECTOMY Right 12/29/2010   snbx, ER/PR +, Her2 -, 0/1 node pos.   CARPAL TUNNEL RELEASE Bilateral 9/12,8,12   CHOLECYSTECTOMY     COLONOSCOPY N/A 02/09/2013   Procedure: COLONOSCOPY;  Surgeon: [name], MD;  [facility]: [facility];  Service: Endoscopy;   Laterality: N/A;   COLONOSCOPY     CORONARY ANGIOPLASTY     CORONARY ARTERY BYPASS GRAFT N/A 05/04/2020   Procedure: CORONARY ARTERY BYPASS GRAFTING (CABG), ON PUMP, TIMES TWO, USING LEFT INTERNAL MAMMARY ARTERY AND RIGHT ENDOSCOPICALLY HARVESTED GREATER SAPHENOUS VEIN;  Surgeon: Rexene Alberts, MD;  Location: [facility];  Service: Open Heart Surgery;  Laterality: N/A;   DILATION AND CURETTAGE OF UTERUS     ESOPHAGOGASTRODUODENOSCOPY N/A 02/09/2013   Procedure: ESOPHAGOGASTRODUODENOSCOPY (EGD);  Surgeon: [name], MD;  Location: [name];  Service: Endoscopy;  Laterality: N/A;   FOOT SPURS     HYSTEROSCOPY WITH D & C N/A 09/11/2012   Procedure: DILATATION AND CURETTAGE ;  Surgeon: [name], MD;  Location: [facility];  Service: Gynecology;  Laterality: N/A;   KNEE SURGERY  2007   LAPAROSCOPIC ASSISTED VAGINAL HYSTERECTOMY N/A 04/05/2016   Procedure: LAPAROSCOPIC ASSISTED VAGINAL HYSTERECTOMY possible BSO;  Surgeon: [name], MD;  Location: [facility];  Service: Gynecology;  Laterality: N/A;   LUMBAR LAMINECTOMY/DECOMPRESSION MICRODISCECTOMY N/A 10/20/2014   Procedure: MICRO LUMBER DECOMPRESSION L3-4 L4-5;  Surgeon: [name], MD;  [facility]: [facility];  Service: Orthopedics;  Laterality: N/A;   LUMBAR LAMINECTOMY/DECOMPRESSION MICRODISCECTOMY Bilateral 10/13/2015   Procedure: MICRO LUMBAR DECOMPRESSION L5 - S1 AND REDO DECOMPRESSION L4 - L5 AND REMOVAL OF FACET CYST L4 - L5 2 LEVELS;  Surgeon: [name], MD;  Location: [facility];  Service: Orthopedics;  Laterality: Bilateral;   PERIPHERAL VASCULAR ATHERECTOMY Right 06/12/2021   Procedure: PERIPHERAL VASCULAR ATHERECTOMY;  Surgeon: Waynetta Sandy, MD;  Location: [facility];  Service: Cardiovascular;  Laterality: Right;  SFA/POP/AT   PERIPHERAL VASCULAR INTERVENTION Right 04/29/2019   Procedure: PERIPHERAL VASCULAR INTERVENTION;  Surgeon: Marty Heck, MD;  Location: [facility];  Service: Cardiovascular;   Laterality: Right;   PERIPHERAL VASCULAR INTERVENTION Right 06/12/2021   Procedure: PERIPHERAL VASCULAR INTERVENTION;  Surgeon: Waynetta Sandy, MD;  Location: [facility];  Service: Cardiovascular;  Laterality: Right;  SFP/POPLITEAL/AT   PORT-A-CATH REMOVAL  04/17/2011   Procedure: REMOVAL PORT-A-CATH;  Surgeon: [name], MD;  Location: [facility];  Service: General;  Laterality: Left;   PORTACATH PLACEMENT  02/07/2011   Procedure: INSERTION PORT-A-CATH;  Surgeon: [name], MD;  [facility];  Service: General;  Laterality: N/A;   RIGHT/LEFT HEART CATH AND CORONARY ANGIOGRAPHY N/A 03/17/2020   Procedure: RIGHT/LEFT HEART CATH AND CORONARY ANGIOGRAPHY;  Surgeon: Burnell Blanks, MD;  Location: [facility];  Service: Cardiovascular;  Laterality: N/A;   TEE WITHOUT CARDIOVERSION N/A 05/04/2020   Procedure: TRANSESOPHAGEAL ECHOCARDIOGRAM (TEE);  Surgeon: Rexene Alberts, MD;  Location: [facility];  Service: Open Heart  Surgery;  Laterality: N/A;   UPPER GASTROINTESTINAL ENDOSCOPY     Patient Active Problem List   Diagnosis Date Noted   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[place]) 03/13/2021   Sinus tachycardia 03/11/2021   Elevated troponin 03/08/2021   Stroke-like symptoms 03/08/2021   Anemia of chronic disease 03/08/2021   Bilateral lower extremity edema 02/21/2021   Acute supraglottitis with epiglottitis 02/05/2021   Epiglottitis 02/05/2021   Reactive airway disease 01/20/2021   Diabetic peripheral neuropathy ([place]) 12/19/2020   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   Primary insomnia 11/02/2020   Irritation of eyelid 07/26/2020   S/P aortic valve replacement with bioprosthetic valve 05/04/2020   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   Peripheral artery disease (HCC)    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[place]) 04/07/2019   Low back pain 02/03/2019   Scoliosis deformity of spine 02/03/2019   Osteoarthritis of  hip 11/17/2018   Claudication ([place])    Chronic headaches 11/12/2017   Degeneration of lumbar intervertebral disc 06/13/2017   Hyperkalemia 06/03/2017   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[place])    Mass of left adrenal gland ([place]) 04/19/2017   Spinal stenosis of lumbar region 10/20/2014   Hyperlipidemia 03/16/2013   Dense breasts 12/22/2012   Anxiety state 04/16/2012   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/16/2012   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   Osteoarthritis of knee 04/16/2012   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 female breast ([place]) 12/20/2010   History of breast cancer 12/05/2010   Depression 09/14/2008   GERD 09/14/2008   Type 2 diabetes mellitus with neurologic complication, with long-term current use of insulin ([place]) 05/20/2008   Coronary atherosclerosis 04/17/2007   Hypothyroidism 04/03/2007    ONSET DATE: 05/29/2021   REFERRING DIAG: M76.720N (ICD-10-CM) - 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[place])   THERAPY DIAG:  Difficulty in walking, not elsewhere classified  Unsteadiness on feet  Other abnormalities of gait and mobility  Muscle weakness (generalized)  Rationale for Evaluation and Treatment Rehabilitation  SUBJECTIVE:   SUBJECTIVE STATEMENT: Good, but tired this morning.    PERTINENT HISTORY: PAD, GERD, CKD, DM with neuropathy, OA and RA, scoliosis, inflammatory polyarthropathy, breast CA, HLD, depression, CABG, Lx surgery, knee surgery    PAIN:  Are you having pain? No   PRECAUTIONS: Fall and Other: AKA   WEIGHT BEARING RESTRICTIONS No  FALLS: Has patient fallen in last 6 months? Yes. Number of falls 2  LIVING ENVIRONMENT: Lives with: lives with their spouse Lives in: House/apartment Home Access: [place]: One level Stairs: No Has following equipment at home: Environmental consultant - 2 wheeled, Wheelchair (manual), Shower bench, bed side commode, Grab bars, and Ramped entry  PLOF:  Independent, Independent with basic ADLs, Independent with gait, and Independent with transfers  PATIENT GOALS get up and get going     LOWER EXTREMITY MMT:   MMT Right eval Left eval Right 7/21 Left  7/21  Hip flexion 4- 4+ 4 4+  Hip extension 2+ 3- 3 3  Hip abduction 4+ 5 4+ 5  Hip adduction        Hip internal rotation        Hip external rotation        Knee flexion 4+   4+   Knee extension 4+   4+   Ankle dorsiflexion 4+   4+   Ankle plantarflexion        Ankle inversion  Ankle eversion        (Blank rows = not tested)      TODAY'S TREATMENT:  08/11/21 Progress note reassess goals and MMT BERG 34/56 Nustep L3 x 6 mins  Walking with walker 2 laps Walking with quad cane ~52f  STS x10 SL stance with quad cane (min-modA) 5s at best Standing hip ABD 2x10 each side, holding on to quad cane  08/08/21 Nustep with ramp ups for 30 intervals Ambulate with RW 4 laps needs rest after 2, walking backwards with RW   SL stance LLE holding on to RW on 1 side, at best 9 seconds Side steps 2" with [facility]  08/03/21 NuStep L3 x 4 min  Ambulate ~76f With RW and CGA, one seated rest break.  Ambulate [facility]22581f[facility]-minA with a couple episodes of LOB due to prosthesis not locking, four seated rest breaks. Pt progressing from step to, to step through gait pattern with cane. Verbal cues to keep prosthetic knee locked at times.  Standing B hip abduction with RW and MinA 2x10 each SLS on LLE 10 secs x2 with no AD and Mod A.  Sidestepping with MinA-ModA x2, one episode of prosthetic knee buckling.   07/26/21 Bridges 3x10 SK2C 30s SLR 2x10 minA to hold prosthetic at foot  Sidelying ext 3x10 Sidelying abd 2x10 Hip flexor stretch supine w/leg off table  Step ups 2HHA 4" minA  Walking with RW  Side steps in // bars  07/20/21  Bridges 2x10 Trunk rotations 1mi69midelying ext 3x10 Sidelying abd 3x10 Step up 6" w/non prosthetic side 2x10 Walking forwards/backwards in  // bars Walking w/RW 2laps Nustep L3, 5min86m 07/17/21 Prone stretch 3mins60midges 2x10 Sidelying hip abd 2x10 minA Sidelying hip ext 2x10 minA STS from elevated table 2x10 Standing hip flexion 2x10 Standing hip abd 2x10 Forwards/Backwards/sideways walking in //   07/13/21  Prone hip flexor stretch x3 minutes Sidelying hip ABD 2x10 B prosthetic on/MinA  Sidelying L hip extension 2x10  B prosthetic on/MinA  Bridges 1x15   WC and prosthetic management with mobility, safety with mobility- more impulsive with transfers today  Standing hip flexion B 1x10 close min guard cues for locking knee on prosthetic Standing hip ABD B 1x10 close in min guard cues for locking knee on prosthetic  Backward walking in // bars min guard for safety Cross midline reaches to L over prosthetic 1x10 for increased WB on residual limb  with practice locking/unlocking prosthetic     07/10/21 Prosthetic donning/doffing  Prone hip flexor stretch  Sidelying hip abd 2x10 Supine bridges 2x10 STS on elevated table  x10 Weight shifts in // bars  Side steps  Walking w/RW   07/06/21 Prosthetic doning/doffing Weight shifting side to side and forwards Step up taps with 2HHA  Walking in parallel bars  Gait training w/walker  PATIENT EDUCATED ON FOLLOWING PROSTHETIC CARE: Prosthetic wear tolerance: 8 hours  hours/day, 7 days/week Prosthetic weight bearing tolerance: 8  hours Other education  Skin check, Residual limb care, Care of non-amputated limb, Prosthetic cleaning, and Proper wear schedule/adjustment    PATIENT EDUCATION: Education details: exercise form and purpose, prosthetic management, safety with pivot transfers while wearing prosthetic  Person educated: Patient and Spouse Education method: Explanation, Demonstration, and Handouts Education comprehension: needs further education   HOME EXERCISE PROGRAM: [phone]:  CLINICAL IMPRESSION: Progress note completed. She has  made good progress with speed of walking but still doing so with RW and not  a quad cane yet. We trailed walking with a quad cane and she is able to do so with minA but is unsteady upon first few steps and fatigues after ~59f, which causes prosthetic to buckle. Continued with SL activities, still requires min-modA due to unsteadiness. Continue practicing gait with LRAD.    OBJECTIVE IMPAIRMENTS Abnormal gait, decreased activity tolerance, decreased balance, decreased coordination, decreased endurance, decreased knowledge of use of DME, decreased mobility, difficulty walking, decreased ROM, decreased strength, decreased safety awareness, increased fascial restrictions, increased muscle spasms, impaired flexibility, and postural dysfunction.   ACTIVITY LIMITATIONS standing, squatting, stairs, transfers, and locomotion level  PARTICIPATION LIMITATIONS: driving, shopping, community activity, and yard work  PERSONAL FACTORS Time since onset of injury/illness/exacerbation are also affecting patient's functional outcome.   REHAB POTENTIAL: Good  CLINICAL DECISION MAKING: Evolving/moderate complexity  EVALUATION COMPLEXITY: Moderate   GOALS: Goals reviewed with patient? No  SHORT TERM GOALS: Target date: 09/08/2021    Will be compliant with appropriate HEP  Baseline: Goal status: Met  2.  Will be able to don/doff prosthetic with Mod(I) Baseline:  Goal status: Met  3.  Will be able to perform stand pivot transfers and ambulate household distances with no more than SBA and LRAD  Baseline:  Goal status: Met  4.  Will demonstrate thorough and complete skin check and verbalize appropriate progression of prosthetic wear schedule  Baseline:  Goal status: Met   LONG TERM GOALS: Target date: 10/06/2021    MMT in all weak groups to have improved by one grade  Baseline:  Goal status: progressing  2.  Hip flexor mm flexibility/mobility to be no more than 20% limited to help improve gait  mechanics  Baseline:  Goal status: IN PROGRESS  3.  Will be able to ambulate at least 2568fwith LRAD and no more than SBA before fatiguing  Baseline: 505fith quad cane, min-modA Goal status: IN PROGRESS  4.  Will be able to navigate at least 4 steps with LRAD safely with no more than min guard assist  Baseline:  Goal status: INITIAL  5.  Will be able to ambulate at least 28f57fer uneven surfaces like grass and gravel with LRAD and no more than min guard assist  Baseline:  Goal status: INITIAL  6.  Will score at least 45 on Berg  Baseline:  Goal status: INITIAL   PLAN: PT FREQUENCY: 2x/week  PT DURATION: 8 weeks  PLANNED INTERVENTIONS: Therapeutic exercises, Therapeutic activity, Neuromuscular re-education, Balance training, Gait training, Patient/Family education, Joint mobilization, Stair training, Prosthetic training, DME instructions, Wheelchair mobility training, Cryotherapy, Moist heat, Taping, Ultrasound, Biofeedback, Manual therapy, and Re-evaluation  PLAN FOR NEXT SESSION: pre gait tasks, strength, gait training, prosthetic management    MonaAndris BaumannT 08/11/2021, 11:03 AM

## 2021-08-11 ENCOUNTER — Ambulatory Visit: Payer: Medicare Other

## 2021-08-11 DIAGNOSIS — M6281 Muscle weakness (generalized): Secondary | ICD-10-CM

## 2021-08-11 DIAGNOSIS — R2689 Other abnormalities of gait and mobility: Secondary | ICD-10-CM

## 2021-08-11 DIAGNOSIS — R262 Difficulty in walking, not elsewhere classified: Secondary | ICD-10-CM | POA: Diagnosis not present

## 2021-08-11 DIAGNOSIS — R2681 Unsteadiness on feet: Secondary | ICD-10-CM

## 2021-08-14 ENCOUNTER — Telehealth: Payer: Self-pay

## 2021-08-14 NOTE — Therapy (Signed)
OUTPATIENT PHYSICAL THERAPY PROSTHETICS TREATMENT   Patient Name: [name] MRN: [id number] DOB:04-27-1940, 81 y.o., female Today's Date: 08/15/2021  PCP: Arlester Marker  REFERRING PROVIDER: Charlett Blake, MD    PT End of Session - 08/15/21 1100     Visit Number 81    Authorization Type U[employer]    Authorization Time Period 07/04/21-08/29/24    Progress Note Due on Visit 10    PT Start Time 1100    PT Stop Time 1143    PT Time Calculation (min) 43 min    Equipment Utilized During Treatment Gait belt    Activity Tolerance Patient tolerated treatment well    Behavior During Therapy WFL for tasks assessed/performed                      Past Medical History:  Diagnosis Date   Allergy    Anemia    childhood   Anxiety    Aortic stenosis    Arterial stenosis (HCC)    mesenteric   Arthritis    Breast cancer ([place]) 12/05/10   R breast, inv mammary, in situ,ER/PR +,HER2 -   Cancer ([employer])    Carcinoma of breast treated with adjuvant chemotherapy ([place])    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[place])    Claudication ([place])    Coronary artery disease    stent 2007   Depression    Diabetes mellitus    Difficulty sleeping    Diverticulosis 12/10/03   Elevated cholesterol    Generalized weakness    GERD (gastroesophageal reflux disease)    Heart murmur    Hepatitis    as an infant   HSV-1 (herpes simplex virus 1) infection    Hyperlipidemia    Hypertension    Hypothyroidism    Osteoarthritis    PAD (peripheral artery disease) ([place])    Personal history of chemotherapy    Personal history of radiation therapy    Pneumonia    2014 september and March 2022   Rheumatoid arthritis ([place])    S/P aortic valve replacement with bioprosthetic valve 05/04/2020   [facility] Inspiris Resilia stented bovine pericardial tissue valve, size 23 mm   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   LIMA to D3, SVG to PDA, EVH via right thigh   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   Dr [name]   Spinal stenosis    Thyroid disease    Past Surgical History:  Procedure Laterality Date   ABDOMINAL AORTOGRAM W/LOWER EXTREMITY Bilateral 04/29/2019   Procedure: ABDOMINAL AORTOGRAM W/LOWER EXTREMITY;  Surgeon: Marty Heck, MD;  Location: [facility];  Service: Cardiovascular;  Laterality: Bilateral;   ABDOMINAL AORTOGRAM W/LOWER EXTREMITY Left 07/01/2019   Procedure: ABDOMINAL AORTOGRAM W/ Left LOWER EXTREMITY [place];  Surgeon: Marty Heck, MD;  Location: [facility];  Service: Cardiovascular;  Laterality: Left;   ABDOMINAL AORTOGRAM W/LOWER EXTREMITY Left 03/03/2021   Procedure: ABDOMINAL AORTOGRAM W/LOWER EXTREMITY;  Surgeon: Cherre Robins, MD;  Location: [facility];  Service: Cardiovascular;  Laterality: Left;   ABDOMINAL AORTOGRAM W/LOWER EXTREMITY Right 06/12/2021   Procedure: ABDOMINAL AORTOGRAM W/LOWER EXTREMITY;  Surgeon: Waynetta Sandy, MD;  Location: [facility];  Service: Cardiovascular;  Laterality: Right;  LOWER LEG   AMPUTATION Left 03/06/2021   Procedure: LEFT ABOVE KNEE AMPUTATION;  Surgeon: [name], MD;  Location: [facility];  Service: Vascular;  Laterality: Left;   ANTERIOR  AND POSTERIOR REPAIR N/A 04/05/2016   Procedure: ANTERIOR (CYSTOCELE) AND POSTERIOR REPAIR (RECTOCELE);  Surgeon: [name], MD;  Location: [facility];  Service: Gynecology;  Laterality: N/A;   AORTIC VALVE REPLACEMENT N/A 05/04/2020   Procedure: AORTIC VALVE REPLACEMENT (AVR) USING INSPIRIS 23MM RESILIA AORTIC VALVE;  Surgeon: Rexene Alberts, MD;  Location: [facility];  Service: Open Heart Surgery;  Laterality: N/A;   BREAST BIOPSY     BREAST LUMPECTOMY  1983   benign biopsy   BREAST LUMPECTOMY Right 12/29/2010   snbx, ER/PR +, Her2 -, 0/1 node pos.   CARPAL TUNNEL RELEASE Bilateral 9/12,8,12   CHOLECYSTECTOMY     COLONOSCOPY N/A 02/09/2013   Procedure: COLONOSCOPY;  Surgeon: [name], MD;  [facility];  Service: Endoscopy;   Laterality: N/A;   COLONOSCOPY     CORONARY ANGIOPLASTY     CORONARY ARTERY BYPASS GRAFT N/A 05/04/2020   Procedure: CORONARY ARTERY BYPASS GRAFTING (CABG), ON PUMP, TIMES TWO, USING LEFT INTERNAL MAMMARY ARTERY AND RIGHT ENDOSCOPICALLY HARVESTED GREATER SAPHENOUS VEIN;  Surgeon: Rexene Alberts, MD;  Location: [facility];  Service: Open Heart Surgery;  Laterality: N/A;   DILATION AND CURETTAGE OF UTERUS     ESOPHAGOGASTRODUODENOSCOPY N/A 02/09/2013   Procedure: ESOPHAGOGASTRODUODENOSCOPY (EGD);  Surgeon: [name], MD;  Location: [name];  Service: Endoscopy;  Laterality: N/A;   FOOT SPURS     HYSTEROSCOPY WITH D & C N/A 09/11/2012   Procedure: DILATATION AND CURETTAGE ;  Surgeon: [name], MD;  Location: [facility];  Service: Gynecology;  Laterality: N/A;   KNEE SURGERY  2007   LAPAROSCOPIC ASSISTED VAGINAL HYSTERECTOMY N/A 04/05/2016   Procedure: LAPAROSCOPIC ASSISTED VAGINAL HYSTERECTOMY possible BSO;  Surgeon: [name], MD;  Location: [facility];  Service: Gynecology;  Laterality: N/A;   LUMBAR LAMINECTOMY/DECOMPRESSION MICRODISCECTOMY N/A 10/20/2014   Procedure: MICRO LUMBER DECOMPRESSION L3-4 L4-5;  Surgeon: [name], MD;  [facility];  Service: Orthopedics;  Laterality: N/A;   LUMBAR LAMINECTOMY/DECOMPRESSION MICRODISCECTOMY Bilateral 10/13/2015   Procedure: MICRO LUMBAR DECOMPRESSION L5 - S1 AND REDO DECOMPRESSION L4 - L5 AND REMOVAL OF FACET CYST L4 - L5 2 LEVELS;  Surgeon: [name], MD;  [facility];  Service: Orthopedics;  Laterality: Bilateral;   PERIPHERAL VASCULAR ATHERECTOMY Right 06/12/2021   Procedure: PERIPHERAL VASCULAR ATHERECTOMY;  Surgeon: Waynetta Sandy, MD;  Location: [facility];  Service: Cardiovascular;  Laterality: Right;  SFA/POP/AT   PERIPHERAL VASCULAR INTERVENTION Right 04/29/2019   Procedure: PERIPHERAL VASCULAR INTERVENTION;  Surgeon: Marty Heck, MD;  Location: [facility];  Service: Cardiovascular;   Laterality: Right;   PERIPHERAL VASCULAR INTERVENTION Right 06/12/2021   Procedure: PERIPHERAL VASCULAR INTERVENTION;  Surgeon: Waynetta Sandy, MD;  Location: [facility];  Service: Cardiovascular;  Laterality: Right;  SFP/POPLITEAL/AT   PORT-A-CATH REMOVAL  04/17/2011   Procedure: REMOVAL PORT-A-CATH;  Surgeon: [name], MD;  Location: [facility];  Service: General;  Laterality: Left;   PORTACATH PLACEMENT  02/07/2011   Procedure: INSERTION PORT-A-CATH;  Surgeon: [name], MD;  [facility];  Service: General;  Laterality: N/A;   RIGHT/LEFT HEART CATH AND CORONARY ANGIOGRAPHY N/A 03/17/2020   Procedure: RIGHT/LEFT HEART CATH AND CORONARY ANGIOGRAPHY;  Surgeon: Burnell Blanks, MD;  Location: [facility];  Service: Cardiovascular;  Laterality: N/A;   TEE WITHOUT CARDIOVERSION N/A 05/04/2020   Procedure: TRANSESOPHAGEAL ECHOCARDIOGRAM (TEE);  Surgeon: Rexene Alberts, MD;  Location: [facility];  Service: Open  Heart Surgery;  Laterality: N/A;   UPPER GASTROINTESTINAL ENDOSCOPY     Patient Active Problem List   Diagnosis Date Noted   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[place]) 03/13/2021   Sinus tachycardia 03/11/2021   Elevated troponin 03/08/2021   Stroke-like symptoms 03/08/2021   Anemia of chronic disease 03/08/2021   Bilateral lower extremity edema 02/21/2021   Acute supraglottitis with epiglottitis 02/05/2021   Epiglottitis 02/05/2021   Reactive airway disease 01/20/2021   Diabetic peripheral neuropathy ([place]) 12/19/2020   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   Primary insomnia 11/02/2020   Irritation of eyelid 07/26/2020   S/P aortic valve replacement with bioprosthetic valve 05/04/2020   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   Peripheral artery disease (HCC)    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[place]) 04/07/2019   Low back pain 02/03/2019   Scoliosis deformity of spine 02/03/2019   Osteoarthritis of  hip 11/17/2018   Claudication ([place])    Chronic headaches 11/12/2017   Degeneration of lumbar intervertebral disc 06/13/2017   Hyperkalemia 06/03/2017   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[place])    Mass of left adrenal gland ([place]) 04/19/2017   Spinal stenosis of lumbar region 10/20/2014   Hyperlipidemia 03/16/2013   Dense breasts 12/22/2012   Anxiety state 04/16/2012   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/16/2012   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   Osteoarthritis of knee 04/16/2012   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 female breast ([place]) 12/20/2010   History of breast cancer 12/05/2010   Depression 09/14/2008   GERD 09/14/2008   Type 2 diabetes mellitus with neurologic complication, with long-term current use of insulin ([place]) 05/20/2008   Coronary atherosclerosis 04/17/2007   Hypothyroidism 04/03/2007    ONSET DATE: 05/29/2021   REFERRING DIAG: C94.709G (ICD-10-CM) - 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[place])   THERAPY DIAG:  Difficulty in walking, not elsewhere classified  Unsteadiness on feet  Other abnormalities of gait and mobility  Muscle weakness (generalized)  Rationale for Evaluation and Treatment Rehabilitation  SUBJECTIVE:   SUBJECTIVE STATEMENT:  Doing just fine.    PERTINENT HISTORY: PAD, GERD, CKD, DM with neuropathy, OA and RA, scoliosis, inflammatory polyarthropathy, breast CA, HLD, depression, CABG, Lx surgery, knee surgery    PAIN:  Are you having pain? No   PRECAUTIONS: Fall and Other: AKA   WEIGHT BEARING RESTRICTIONS No  FALLS: Has patient fallen in last 6 months? Yes. Number of falls 2  LIVING ENVIRONMENT: Lives with: lives with their spouse Lives in: House/apartment Home Access: [place]: One level Stairs: No Has following equipment at home: Environmental consultant - 2 wheeled, Wheelchair (manual), Shower bench, bed side commode, Grab bars, and Ramped entry  PLOF: Independent,  Independent with basic ADLs, Independent with gait, and Independent with transfers  PATIENT GOALS get up and get going     LOWER EXTREMITY MMT:   MMT Right eval Left eval Right 7/21 Left  7/21  Hip flexion 4- 4+ 4 4+  Hip extension 2+ 3- 3 3  Hip abduction 4+ 5 4+ 5  Hip adduction        Hip internal rotation        Hip external rotation        Knee flexion 4+   4+   Knee extension 4+   4+   Ankle dorsiflexion 4+   4+   Ankle plantarflexion        Ankle inversion  Ankle eversion        (Blank rows = not tested)      TODAY'S TREATMENT:  08/15/21 Nustep L5 x49mns  Walking with RW 2 big laps  Walking with cane 1 small lap  Side steps with quad cane modA Lateral step ups 2HHA 4" x10   08/11/21 Progress note reassess goals and MMT BERG 34/56 Nustep L3 x 6 mins  Walking with walker 2 laps Walking with quad cane ~522f STS x10 SL stance with quad cane (min-modA) 5s at best Standing hip ABD 2x10 each side, holding on to quad cane  08/08/21 Nustep with ramp ups for 30 intervals Ambulate with RW 4 laps needs rest after 2, walking backwards with RW   SL stance LLE holding on to RW on 1 side, at best 9 seconds Side steps 2" [facility] 08/03/21 NuStep L3 x 4 min  Ambulate ~7529fWith RW and CGA, one seated rest break.  Ambulate ~225f81fith [facility]-minA with a couple episodes of LOB due to prosthesis not locking, four seated rest breaks. Pt progressing from step to, to step through gait pattern with cane. Verbal cues to keep prosthetic knee locked at times.  Standing B hip abduction with RW and MinA 2x10 each SLS on LLE 10 secs x2 with no AD and Mod A.  Sidestepping with MinA-ModA x2, one episode of prosthetic knee buckling.   07/26/21 Bridges 3x10 SK2C 30s SLR 2x10 minA to hold prosthetic at foot  Sidelying ext 3x10 Sidelying abd 2x10 Hip flexor stretch supine w/leg off table  Step ups 2HHA 4" minA  Walking with RW  Side steps in // bars  07/20/21   Bridges 2x10 Trunk rotations 1min83mdelying ext 3x10 Sidelying abd 3x10 Step up 6" w/non prosthetic side 2x10 Walking forwards/backwards in // bars Walking w/RW 2laps Nustep L3, 5mins1m6/26/23 Prone stretch 3mins 47mdges 2x10 Sidelying hip abd 2x10 minA Sidelying hip ext 2x10 minA STS from elevated table 2x10 Standing hip flexion 2x10 Standing hip abd 2x10 Forwards/Backwards/sideways walking in //   07/13/21  Prone hip flexor stretch x3 minutes Sidelying hip ABD 2x10 B prosthetic on/MinA  Sidelying L hip extension 2x10  B prosthetic on/MinA  Bridges 1x15   WC and prosthetic management with mobility, safety with mobility- more impulsive with transfers today  Standing hip flexion B 1x10 close min guard cues for locking knee on prosthetic Standing hip ABD B 1x10 close in min guard cues for locking knee on prosthetic  Backward walking in // bars min guard for safety Cross midline reaches to L over prosthetic 1x10 for increased WB on residual limb  with practice locking/unlocking prosthetic     07/10/21 Prosthetic donning/doffing  Prone hip flexor stretch  Sidelying hip abd 2x10 Supine bridges 2x10 STS on elevated table  x10 Weight shifts in // bars  Side steps  Walking w/RW   07/06/21 Prosthetic doning/doffing Weight shifting side to side and forwards Step up taps with 2HHA  Walking in parallel bars  Gait training w/walker  PATIENT EDUCATED ON FOLLOWING PROSTHETIC CARE: Prosthetic wear tolerance: 8 hours  hours/day, 7 days/week Prosthetic weight bearing tolerance: 8  hours Other education  Skin check, Residual limb care, Care of non-amputated limb, Prosthetic cleaning, and Proper wear schedule/adjustment    PATIENT EDUCATION: Education details: exercise form and purpose, prosthetic management, safety with pivot transfers while wearing prosthetic  Person educated: Patient and Spouse Education method: Explanation, Demonstration, and Handouts Education  comprehension: needs  further education   HOME EXERCISE PROGRAM: [phone]  ASSESSMENT:  CLINICAL IMPRESSION: Gait training consisted of walking 2 big loops in the gym with RW, 2 instances of prosthetic buckling. Then walked with quad cane ~85f, mod-maxA. Patient loses balance more than 3x and requires maxA to stand back up and lock prosthetic. Side steps with quad cane, requires modA due to fatigue and occasional LOB.    OBJECTIVE IMPAIRMENTS Abnormal gait, decreased activity tolerance, decreased balance, decreased coordination, decreased endurance, decreased knowledge of use of DME, decreased mobility, difficulty walking, decreased ROM, decreased strength, decreased safety awareness, increased fascial restrictions, increased muscle spasms, impaired flexibility, and postural dysfunction.   ACTIVITY LIMITATIONS standing, squatting, stairs, transfers, and locomotion level  PARTICIPATION LIMITATIONS: driving, shopping, community activity, and yard work  PERSONAL FACTORS Time since onset of injury/illness/exacerbation are also affecting patient's functional outcome.   REHAB POTENTIAL: Good  CLINICAL DECISION MAKING: Evolving/moderate complexity  EVALUATION COMPLEXITY: Moderate   GOALS: Goals reviewed with patient? No  SHORT TERM GOALS: Target date: 09/12/2021    Will be compliant with appropriate HEP  Baseline: Goal status: Met  2.  Will be able to don/doff prosthetic with Mod(I) Baseline:  Goal status: Met  3.  Will be able to perform stand pivot transfers and ambulate household distances with no more than SBA and LRAD  Baseline:  Goal status: Met  4.  Will demonstrate thorough and complete skin check and verbalize appropriate progression of prosthetic wear schedule  Baseline:  Goal status: Met   LONG TERM GOALS: Target date: 10/10/2021    MMT in all weak groups to have improved by one grade  Baseline:  Goal status: progressing  2.  Hip flexor mm flexibility/mobility  to be no more than 20% limited to help improve gait mechanics  Baseline:  Goal status: IN PROGRESS  3.  Will be able to ambulate at least 2575fwith LRAD and no more than SBA before fatiguing  Baseline: 5041fith quad cane, min-modA Goal status: IN PROGRESS  4.  Will be able to navigate at least 4 steps with LRAD safely with no more than min guard assist  Baseline:  Goal status: INITIAL  5.  Will be able to ambulate at least 43f41fer uneven surfaces like grass and gravel with LRAD and no more than min guard assist  Baseline:  Goal status: INITIAL  6.  Will score at least 45 on Berg  Baseline:  Goal status: INITIAL   PLAN: PT FREQUENCY: 2x/week  PT DURATION: 8 weeks  PLANNED INTERVENTIONS: Therapeutic exercises, Therapeutic activity, Neuromuscular re-education, Balance training, Gait training, Patient/Family education, Joint mobilization, Stair training, Prosthetic training, DME instructions, Wheelchair mobility training, Cryotherapy, Moist heat, Taping, Ultrasound, Biofeedback, Manual therapy, and Re-evaluation  PLAN FOR NEXT SESSION: pre gait tasks, strength, gait training, prosthetic management    MonaAndris BaumannT 08/15/2021, 11:47 AM

## 2021-08-14 NOTE — Telephone Encounter (Signed)
Pt called stating the she had surgery in May and her big toe is red and burns all the time. She wants to know when it will quit or what to put on it.  Reviewed pt's chart, returned pt's call, no answer, no vm

## 2021-08-15 ENCOUNTER — Ambulatory Visit: Payer: Medicare Other

## 2021-08-15 ENCOUNTER — Other Ambulatory Visit: Payer: Self-pay | Admitting: Cardiovascular Disease

## 2021-08-15 DIAGNOSIS — R2689 Other abnormalities of gait and mobility: Secondary | ICD-10-CM

## 2021-08-15 DIAGNOSIS — R262 Difficulty in walking, not elsewhere classified: Secondary | ICD-10-CM | POA: Diagnosis not present

## 2021-08-15 DIAGNOSIS — M6281 Muscle weakness (generalized): Secondary | ICD-10-CM

## 2021-08-15 DIAGNOSIS — R2681 Unsteadiness on feet: Secondary | ICD-10-CM

## 2021-08-16 ENCOUNTER — Telehealth: Payer: Self-pay | Admitting: Cardiovascular Disease

## 2021-08-16 MED ORDER — METOPROLOL TARTRATE 50 MG PO TABS: 50.0000 mg | ORAL_TABLET | Freq: Two times a day (BID) | ORAL | 3 refills | Status: DC

## 2021-08-16 MED ORDER — METOPROLOL TARTRATE 50 MG PO TABS: 50.0000 mg | ORAL_TABLET | Freq: Two times a day (BID) | ORAL | 0 refills | Status: DC

## 2021-08-16 NOTE — Telephone Encounter (Signed)
*  STAT* If patient is at the pharmacy, call can be transferred to refill team.   1. Which medications need to be refilled? (please list name of each medication and dose if known)   metoprolol tartrate (LOPRESSOR) 50 MG tablet    2. Which pharmacy/location (including street and city if local pharmacy) is medication to be sent to? [id number] - [place], [place] - [address]  3. Do they need a 30 day or 90 day supply?  20 day   Pt needs enough sent to above pharmacy until Home Delivery comes in

## 2021-08-16 NOTE — Telephone Encounter (Signed)
Pt's medication metoprolol was sent to local pharmacy [facility] for a 15 day supply until mail order arrives. Confirmation received.

## 2021-08-16 NOTE — Therapy (Signed)
OUTPATIENT PHYSICAL THERAPY PROSTHETICS TREATMENT   Patient Name: [name] MRN: [id number] DOB:01/07/1941, 81 y.o., female Today's Date: 08/17/2021  PCP: Arlester Marker  REFERRING PROVIDER: Charlett Blake, MD    PT End of Session - 08/17/21 1252     Visit Number 12    Authorization Type U[employer]    Authorization Time Period 07/04/21-08/29/24    Progress Note Due on Visit 10    PT Start Time 1252    PT Stop Time 4401    PT Time Calculation (min) 45 min    Equipment Utilized During Treatment Gait belt    Activity Tolerance Patient tolerated treatment well    Behavior During Therapy WFL for tasks assessed/performed                       Past Medical History:  Diagnosis Date   Allergy    Anemia    childhood   Anxiety    Aortic stenosis    Arterial stenosis (HCC)    mesenteric   Arthritis    Breast cancer ([place]) 12/05/10   R breast, inv mammary, in situ,ER/PR +,HER2 -   Cancer ([place])    Carcinoma of breast treated with adjuvant chemotherapy ([place])    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[place])    Claudication ([place])    Coronary artery disease    stent 2007   Depression    Diabetes mellitus    Difficulty sleeping    Diverticulosis 12/10/03   Elevated cholesterol    Generalized weakness    GERD (gastroesophageal reflux disease)    Heart murmur    Hepatitis    as an infant   HSV-1 (herpes simplex virus 1) infection    Hyperlipidemia    Hypertension    Hypothyroidism    Osteoarthritis    PAD (peripheral artery disease) ([place])    Personal history of chemotherapy    Personal history of radiation therapy    Pneumonia    2014 september and March 2022   Rheumatoid arthritis ([place])    S/P aortic valve replacement with bioprosthetic valve 05/04/2020   [facility] Inspiris Resilia stented bovine pericardial tissue valve, size 23 mm   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   LIMA to D3, SVG to PDA, EVH via right thigh   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   Dr [name]   Spinal stenosis    Thyroid disease    Past Surgical History:  Procedure Laterality Date   ABDOMINAL AORTOGRAM W/LOWER EXTREMITY Bilateral 04/29/2019   Procedure: ABDOMINAL AORTOGRAM W/LOWER EXTREMITY;  Surgeon: Marty Heck, MD;  Location: [facility];  Service: Cardiovascular;  Laterality: Bilateral;   ABDOMINAL AORTOGRAM W/LOWER EXTREMITY Left 07/01/2019   Procedure: ABDOMINAL AORTOGRAM W/ Left LOWER EXTREMITY [place];  Surgeon: Marty Heck, MD;  Location: [facility];  Service: Cardiovascular;  Laterality: Left;   ABDOMINAL AORTOGRAM W/LOWER EXTREMITY Left 03/03/2021   Procedure: ABDOMINAL AORTOGRAM W/LOWER EXTREMITY;  Surgeon: Cherre Robins, MD;  Location: [facility];  Service: Cardiovascular;  Laterality: Left;   ABDOMINAL AORTOGRAM W/LOWER EXTREMITY Right 06/12/2021   Procedure: ABDOMINAL AORTOGRAM W/LOWER EXTREMITY;  Surgeon: Waynetta Sandy, MD;  Location: [facility];  Service: Cardiovascular;  Laterality: Right;  LOWER LEG   AMPUTATION Left 03/06/2021   Procedure: LEFT ABOVE KNEE AMPUTATION;  Surgeon: [name], MD;  Location: [facility];  Service: Vascular;  Laterality: Left;  ANTERIOR AND POSTERIOR REPAIR N/A 04/05/2016   Procedure: ANTERIOR (CYSTOCELE) AND POSTERIOR REPAIR (RECTOCELE);  Surgeon: [name], MD;  Location: [facility];  Service: Gynecology;  Laterality: N/A;   AORTIC VALVE REPLACEMENT N/A 05/04/2020   Procedure: AORTIC VALVE REPLACEMENT (AVR) USING INSPIRIS 23MM RESILIA AORTIC VALVE;  Surgeon: Rexene Alberts, MD;  Location: [facility];  Service: Open Heart Surgery;  Laterality: N/A;   BREAST BIOPSY     BREAST LUMPECTOMY  1983   benign biopsy   BREAST LUMPECTOMY Right 12/29/2010   snbx, ER/PR +, Her2 -, 0/1 node pos.   CARPAL TUNNEL RELEASE Bilateral 9/12,8,12   CHOLECYSTECTOMY     COLONOSCOPY N/A 02/09/2013   Procedure: COLONOSCOPY;  Surgeon: [name], MD;  [facility]: [facility];  Service: Endoscopy;   Laterality: N/A;   COLONOSCOPY     CORONARY ANGIOPLASTY     CORONARY ARTERY BYPASS GRAFT N/A 05/04/2020   Procedure: CORONARY ARTERY BYPASS GRAFTING (CABG), ON PUMP, TIMES TWO, USING LEFT INTERNAL MAMMARY ARTERY AND RIGHT ENDOSCOPICALLY HARVESTED GREATER SAPHENOUS VEIN;  Surgeon: Rexene Alberts, MD;  Location: [facility];  Service: Open Heart Surgery;  Laterality: N/A;   DILATION AND CURETTAGE OF UTERUS     ESOPHAGOGASTRODUODENOSCOPY N/A 02/09/2013   Procedure: ESOPHAGOGASTRODUODENOSCOPY (EGD);  Surgeon: [name], MD;  Location: [name];  Service: Endoscopy;  Laterality: N/A;   FOOT SPURS     HYSTEROSCOPY WITH D & C N/A 09/11/2012   Procedure: DILATATION AND CURETTAGE ;  Surgeon: [name], MD;  Location: [facility];  Service: Gynecology;  Laterality: N/A;   KNEE SURGERY  2007   LAPAROSCOPIC ASSISTED VAGINAL HYSTERECTOMY N/A 04/05/2016   Procedure: LAPAROSCOPIC ASSISTED VAGINAL HYSTERECTOMY possible BSO;  Surgeon: [name], MD;  Location: [facility];  Service: Gynecology;  Laterality: N/A;   LUMBAR LAMINECTOMY/DECOMPRESSION MICRODISCECTOMY N/A 10/20/2014   Procedure: MICRO LUMBER DECOMPRESSION L3-4 L4-5;  Surgeon: [name], MD;  [facility]: [facility];  Service: Orthopedics;  Laterality: N/A;   LUMBAR LAMINECTOMY/DECOMPRESSION MICRODISCECTOMY Bilateral 10/13/2015   Procedure: MICRO LUMBAR DECOMPRESSION L5 - S1 AND REDO DECOMPRESSION L4 - L5 AND REMOVAL OF FACET CYST L4 - L5 2 LEVELS;  Surgeon: [name], MD;  Location: [facility];  Service: Orthopedics;  Laterality: Bilateral;   PERIPHERAL VASCULAR ATHERECTOMY Right 06/12/2021   Procedure: PERIPHERAL VASCULAR ATHERECTOMY;  Surgeon: Waynetta Sandy, MD;  Location: [facility];  Service: Cardiovascular;  Laterality: Right;  SFA/POP/AT   PERIPHERAL VASCULAR INTERVENTION Right 04/29/2019   Procedure: PERIPHERAL VASCULAR INTERVENTION;  Surgeon: Marty Heck, MD;  Location: [facility];  Service: Cardiovascular;   Laterality: Right;   PERIPHERAL VASCULAR INTERVENTION Right 06/12/2021   Procedure: PERIPHERAL VASCULAR INTERVENTION;  Surgeon: Waynetta Sandy, MD;  Location: [facility];  Service: Cardiovascular;  Laterality: Right;  SFP/POPLITEAL/AT   PORT-A-CATH REMOVAL  04/17/2011   Procedure: REMOVAL PORT-A-CATH;  Surgeon: [name], MD;  Location: [facility];  Service: General;  Laterality: Left;   PORTACATH PLACEMENT  02/07/2011   Procedure: INSERTION PORT-A-CATH;  Surgeon: [name], MD;  [facility];  Service: General;  Laterality: N/A;   RIGHT/LEFT HEART CATH AND CORONARY ANGIOGRAPHY N/A 03/17/2020   Procedure: RIGHT/LEFT HEART CATH AND CORONARY ANGIOGRAPHY;  Surgeon: Burnell Blanks, MD;  Location: [facility];  Service: Cardiovascular;  Laterality: N/A;   TEE WITHOUT CARDIOVERSION N/A 05/04/2020   Procedure: TRANSESOPHAGEAL ECHOCARDIOGRAM (TEE);  Surgeon: Rexene Alberts, MD;  Location: [facility];  Service:  Open Heart Surgery;  Laterality: N/A;   UPPER GASTROINTESTINAL ENDOSCOPY     Patient Active Problem List   Diagnosis Date Noted   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[place]) 03/13/2021   Sinus tachycardia 03/11/2021   Elevated troponin 03/08/2021   Stroke-like symptoms 03/08/2021   Anemia of chronic disease 03/08/2021   Bilateral lower extremity edema 02/21/2021   Acute supraglottitis with epiglottitis 02/05/2021   Epiglottitis 02/05/2021   Reactive airway disease 01/20/2021   Diabetic peripheral neuropathy ([place]) 12/19/2020   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   Primary insomnia 11/02/2020   Irritation of eyelid 07/26/2020   S/P aortic valve replacement with bioprosthetic valve 05/04/2020   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   Peripheral artery disease (HCC)    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[place]) 04/07/2019   Low back pain 02/03/2019   Scoliosis deformity of spine 02/03/2019   Osteoarthritis of  hip 11/17/2018   Claudication ([place])    Chronic headaches 11/12/2017   Degeneration of lumbar intervertebral disc 06/13/2017   Hyperkalemia 06/03/2017   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[place])    Mass of left adrenal gland ([place]) 04/19/2017   Spinal stenosis of lumbar region 10/20/2014   Hyperlipidemia 03/16/2013   Dense breasts 12/22/2012   Anxiety state 04/16/2012   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/16/2012   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   Osteoarthritis of knee 04/16/2012   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 female breast ([place]) 12/20/2010   History of breast cancer 12/05/2010   Depression 09/14/2008   GERD 09/14/2008   Type 2 diabetes mellitus with neurologic complication, with long-term current use of insulin ([place]) 05/20/2008   Coronary atherosclerosis 04/17/2007   Hypothyroidism 04/03/2007    ONSET DATE: 05/29/2021   REFERRING DIAG: P01.410V (ICD-10-CM) - 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[place])   THERAPY DIAG:  Difficulty in walking, not elsewhere classified  Unsteadiness on feet  Other abnormalities of gait and mobility  Muscle weakness (generalized)  Rationale for Evaluation and Treatment Rehabilitation  SUBJECTIVE:   SUBJECTIVE STATEMENT:  Doing good, fell on Tuesday because her feet got caught under her. She was standing at her kitchen sink and the wheelchair was behind her. She fell on her side, denies hitting her head and no cuts or bruises present.    PERTINENT HISTORY: PAD, GERD, CKD, DM with neuropathy, OA and RA, scoliosis, inflammatory polyarthropathy, breast CA, HLD, depression, CABG, Lx surgery, knee surgery    PAIN:  Are you having pain? No   PRECAUTIONS: Fall and Other: AKA   WEIGHT BEARING RESTRICTIONS No  FALLS: Has patient fallen in last 6 months? Yes. Number of falls 2  LIVING ENVIRONMENT: Lives with: lives with their spouse Lives in: House/apartment Home Access: [place]: One level Stairs: No Has following equipment at home: Environmental consultant - 2 wheeled, Wheelchair (manual), Shower bench, bed side commode, Grab bars, and Ramped entry  PLOF: Independent, Independent with basic ADLs, Independent with gait, and Independent with transfers  PATIENT GOALS get up and get going     LOWER EXTREMITY MMT:   MMT Right eval Left eval Right 7/21 Left  7/21  Hip flexion 4- 4+ 4 4+  Hip extension 2+ 3- 3 3  Hip abduction 4+ 5 4+ 5  Hip adduction        Hip internal rotation        Hip external rotation  Knee flexion 4+   4+   Knee extension 4+   4+   Ankle dorsiflexion 4+   4+   Ankle plantarflexion        Ankle inversion        Ankle eversion        (Blank rows = not tested)      TODAY'S TREATMENT:  08/17/21 Nustep L4 x25mns Navigating curbs outside and in parking lot with RW Walking w/quad cane 1HHA Step ups on 2", 4", 6" in // bars   08/15/21 Nustep L5 x676ms  Walking with RW 2 big laps  Walking with cane 1 small lap  Side steps with quad cane modA Lateral step ups 2HHA 4" x10   08/11/21 Progress note reassess goals and MMT BERG 34/56 Nustep L3 x 6 mins  Walking with walker 2 laps Walking with quad cane ~5069fSTS x10 SL stance with quad cane (min-modA) 5s at best Standing hip ABD 2x10 each side, holding on to quad cane  08/08/21 Nustep with ramp ups for 30 intervals Ambulate with RW 4 laps needs rest after 2, walking backwards with RW   SL stance LLE holding on to RW on 1 side, at best 9 seconds Side steps 2" [facility] NuStep L3 x 4 min  Ambulate ~5f57fith RW and CGA, one seated rest break.  Ambulate ~225ft67fth [facility]-minA with a couple episodes of LOB due to prosthesis not locking, four seated rest breaks. Pt progressing from step to, to step through gait pattern with cane. Verbal cues to keep prosthetic knee locked at times.  Standing B hip abduction with RW and MinA 2x10 each SLS on LLE 10  secs x2 with no AD and Mod A.  Sidestepping with MinA-ModA x2, one episode of prosthetic knee buckling.   07/26/21 Bridges 3x10 SK2C 30s SLR 2x10 minA to hold prosthetic at foot  Sidelying ext 3x10 Sidelying abd 2x10 Hip flexor stretch supine w/leg off table  Step ups 2HHA 4" minA  Walking with RW  Side steps in // bars  07/20/21  Bridges 2x10 Trunk rotations 1min 23melying ext 3x10 Sidelying abd 3x10 Step up 6" w/non prosthetic side 2x10 Walking forwards/backwards in // bars Walking w/RW 2laps Nustep L3, 5mins 58m/26/23 Prone stretch 3mins B74mges 2x10 Sidelying hip abd 2x10 minA Sidelying hip ext 2x10 minA STS from elevated table 2x10 Standing hip flexion 2x10 Standing hip abd 2x10 Forwards/Backwards/sideways walking in //   07/13/21  Prone hip flexor stretch x3 minutes Sidelying hip ABD 2x10 B prosthetic on/MinA  Sidelying L hip extension 2x10  B prosthetic on/MinA  Bridges 1x15   WC and prosthetic management with mobility, safety with mobility- more impulsive with transfers today  Standing hip flexion B 1x10 close min guard cues for locking knee on prosthetic Standing hip ABD B 1x10 close in min guard cues for locking knee on prosthetic  Backward walking in // bars min guard for safety Cross midline reaches to L over prosthetic 1x10 for increased WB on residual limb  with practice locking/unlocking prosthetic     07/10/21 Prosthetic donning/doffing  Prone hip flexor stretch  Sidelying hip abd 2x10 Supine bridges 2x10 STS on elevated table  x10 Weight shifts in // bars  Side steps  Walking w/RW   07/06/21 Prosthetic doning/doffing Weight shifting side to side and forwards Step up taps with 2HHA  Walking in parallel bars  Gait training w/walker  PATIENT EDUCATED ON FOLLOWING PROSTHETIC CARE:  Prosthetic wear tolerance: 8 hours  hours/day, 7 days/week Prosthetic weight bearing tolerance: 8  hours Other education  Skin check, Residual limb care, Care  of non-amputated limb, Prosthetic cleaning, and Proper wear schedule/adjustment    PATIENT EDUCATION: Education details: exercise form and purpose, prosthetic management, safety with pivot transfers while wearing prosthetic  Person educated: Patient and Spouse Education method: Explanation, Demonstration, and Handouts Education comprehension: needs further education   HOME EXERCISE PROGRAM: [phone]  ASSESSMENT:  CLINICAL IMPRESSION: Today's session focused on community reintegration and gait training outdoors. We worked on walking down curbs, across the street, and up and down curbs. She did really well and had no instances of buckling. MinA for safety reasons. Better stability with quad cane today, only buckles 1 time. Worked on step ups with different height steps for practice with stair and curb navigation. Requires frequent cueing for sequencing to step up with the good and down with the bad.    OBJECTIVE IMPAIRMENTS Abnormal gait, decreased activity tolerance, decreased balance, decreased coordination, decreased endurance, decreased knowledge of use of DME, decreased mobility, difficulty walking, decreased ROM, decreased strength, decreased safety awareness, increased fascial restrictions, increased muscle spasms, impaired flexibility, and postural dysfunction.   ACTIVITY LIMITATIONS standing, squatting, stairs, transfers, and locomotion level  PARTICIPATION LIMITATIONS: driving, shopping, community activity, and yard work  PERSONAL FACTORS Time since onset of injury/illness/exacerbation are also affecting patient's functional outcome.   REHAB POTENTIAL: Good  CLINICAL DECISION MAKING: Evolving/moderate complexity  EVALUATION COMPLEXITY: Moderate   GOALS: Goals reviewed with patient? No  SHORT TERM GOALS: Target date: 09/14/2021    Will be compliant with appropriate HEP  Baseline: Goal status: Met  2.  Will be able to don/doff prosthetic with Mod(I) Baseline:  Goal  status: Met  3.  Will be able to perform stand pivot transfers and ambulate household distances with no more than SBA and LRAD  Baseline:  Goal status: Met  4.  Will demonstrate thorough and complete skin check and verbalize appropriate progression of prosthetic wear schedule  Baseline:  Goal status: Met   LONG TERM GOALS: Target date: 10/12/2021    MMT in all weak groups to have improved by one grade  Baseline:  Goal status: progressing  2.  Hip flexor mm flexibility/mobility to be no more than 20% limited to help improve gait mechanics  Baseline:  Goal status: IN PROGRESS  3.  Will be able to ambulate at least 223f with LRAD and no more than SBA before fatiguing  Baseline: 578fwith quad cane, min-modA Goal status: IN PROGRESS  4.  Will be able to navigate at least 4 steps with LRAD safely with no more than min guard assist  Baseline:  Goal status: INITIAL  5.  Will be able to ambulate at least 507fver uneven surfaces like grass and gravel with LRAD and no more than min guard assist  Baseline:  Goal status: INITIAL  6.  Will score at least 45 on [place]  Baseline:  Goal status: INITIAL   PLAN: PT FREQUENCY: 2x/week  PT DURATION: 8 weeks  PLANNED INTERVENTIONS: Therapeutic exercises, Therapeutic activity, Neuromuscular re-education, Balance training, Gait training, Patient/Family education, Joint mobilization, Stair training, Prosthetic training, DME instructions, Wheelchair mobility training, Cryotherapy, Moist heat, Taping, Ultrasound, Biofeedback, Manual therapy, and Re-evaluation  PLAN FOR NEXT SESSION: pre gait tasks, strength, gait training, prosthetic management    MonAndris BaumannPT 08/17/2021, 1:39 PM

## 2021-08-17 ENCOUNTER — Ambulatory Visit: Payer: Medicare Other

## 2021-08-17 DIAGNOSIS — R262 Difficulty in walking, not elsewhere classified: Secondary | ICD-10-CM | POA: Diagnosis not present

## 2021-08-17 DIAGNOSIS — R2689 Other abnormalities of gait and mobility: Secondary | ICD-10-CM

## 2021-08-17 DIAGNOSIS — R2681 Unsteadiness on feet: Secondary | ICD-10-CM

## 2021-08-17 DIAGNOSIS — M6281 Muscle weakness (generalized): Secondary | ICD-10-CM

## 2021-08-21 ENCOUNTER — Telehealth: Payer: Self-pay

## 2021-08-21 NOTE — Telephone Encounter (Signed)
Pt called stating that ever since she had stents placed on 06/12/21, her great toe constantly burns on the bottom and she can barely wear socks and shoes. She is seeking advice.  Reviewed pt's chart, sent msg to [place], [place], returned pt's call, two identifiers used. Pt stated that she had neuropathy, but the Gabapentin previously prescribed caused incontinence. The burning she's experiencing has not stopped or improved since vascular surgery. Pt will see PCP and discuss other treatment options. Pt will call this office if needed. Confirmed understanding.

## 2021-08-21 NOTE — Therapy (Signed)
OUTPATIENT PHYSICAL THERAPY PROSTHETICS TREATMENT   Patient Name: [name] MRN: [id number] DOB:18-Mar-1940, 81 y.o., female Today's Date: 08/22/2021  PCP: Arlester Marker  REFERRING PROVIDER: Charlett Blake, MD    PT End of Session - 08/22/21 0926     Visit Number 59    Authorization Type U[employer]    Authorization Time Period 07/04/21-08/29/24    Progress Note Due on Visit 10    PT Start Time 0929    PT Stop Time 1013    PT Time Calculation (min) 44 min    Equipment Utilized During Treatment Gait belt    Activity Tolerance Patient tolerated treatment well    Behavior During Therapy [facility] for tasks assessed/performed                        Past Medical History:  Diagnosis Date   Allergy    Anemia    childhood   Anxiety    Aortic stenosis    Arterial stenosis (HCC)    mesenteric   Arthritis    Breast cancer ([place]) 12/05/10   R breast, inv mammary, in situ,ER/PR +,HER2 -   Cancer ([place])    Carcinoma of breast treated with adjuvant chemotherapy ([place])    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[place])    Claudication ([place])    Coronary artery disease    stent 2007   Depression    Diabetes mellitus    Difficulty sleeping    Diverticulosis 12/10/03   Elevated cholesterol    Generalized weakness    GERD (gastroesophageal reflux disease)    Heart murmur    Hepatitis    as an infant   HSV-1 (herpes simplex virus 1) infection    Hyperlipidemia    Hypertension    Hypothyroidism    Osteoarthritis    PAD (peripheral artery disease) ([place])    Personal history of chemotherapy    Personal history of radiation therapy    Pneumonia    2014 september and March 2022   Rheumatoid arthritis ([place])    S/P aortic valve replacement with bioprosthetic valve 05/04/2020   [facility] Inspiris Resilia stented bovine pericardial tissue valve, size 23 mm   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   LIMA to D3, SVG to PDA, EVH via right thigh   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   Dr  [name]   Spinal stenosis    Thyroid disease    Past Surgical History:  Procedure Laterality Date   ABDOMINAL AORTOGRAM W/LOWER EXTREMITY Bilateral 04/29/2019   Procedure: ABDOMINAL AORTOGRAM W/LOWER EXTREMITY;  Surgeon: Marty Heck, MD;  Location: [place] [facility];  Service: Cardiovascular;  Laterality: Bilateral;   ABDOMINAL AORTOGRAM W/LOWER EXTREMITY Left 07/01/2019   Procedure: ABDOMINAL AORTOGRAM W/ Left LOWER EXTREMITY [place];  Surgeon: Marty Heck, MD;  Location: [place] [facility];  Service: Cardiovascular;  Laterality: Left;   ABDOMINAL AORTOGRAM W/LOWER EXTREMITY Left 03/03/2021   Procedure: ABDOMINAL AORTOGRAM W/LOWER EXTREMITY;  Surgeon: Cherre Robins, MD;  Location: [place] [facility];  Service: Cardiovascular;  Laterality: Left;   ABDOMINAL AORTOGRAM W/LOWER EXTREMITY Right 06/12/2021   Procedure: ABDOMINAL AORTOGRAM W/LOWER EXTREMITY;  Surgeon: Waynetta Sandy, MD;  Location: [place] [facility];  Service: Cardiovascular;  Laterality: Right;  LOWER LEG   AMPUTATION Left 03/06/2021   Procedure: LEFT ABOVE KNEE AMPUTATION;  Surgeon: [name], MD;  Location: [facility];  Service: Vascular;  Laterality: Left;  ANTERIOR AND POSTERIOR REPAIR N/A 04/05/2016   Procedure: ANTERIOR (CYSTOCELE) AND POSTERIOR REPAIR (RECTOCELE);  Surgeon: [name], MD;  Location: [facility];  Service: Gynecology;  Laterality: N/A;   AORTIC VALVE REPLACEMENT N/A 05/04/2020   Procedure: AORTIC VALVE REPLACEMENT (AVR) USING INSPIRIS 23MM RESILIA AORTIC VALVE;  Surgeon: Rexene Alberts, MD;  Location: [facility];  Service: Open Heart Surgery;  Laterality: N/A;   BREAST BIOPSY     BREAST LUMPECTOMY  1983   benign biopsy   BREAST LUMPECTOMY Right 12/29/2010   snbx, ER/PR +, Her2 -, 0/1 node pos.   CARPAL TUNNEL RELEASE Bilateral 9/12,8,12   CHOLECYSTECTOMY     COLONOSCOPY N/A 02/09/2013   Procedure: COLONOSCOPY;  Surgeon: [name], MD;  Location: [facility];  Service:  Endoscopy;  Laterality: N/A;   COLONOSCOPY     CORONARY ANGIOPLASTY     CORONARY ARTERY BYPASS GRAFT N/A 05/04/2020   Procedure: CORONARY ARTERY BYPASS GRAFTING (CABG), ON PUMP, TIMES TWO, USING LEFT INTERNAL MAMMARY ARTERY AND RIGHT ENDOSCOPICALLY HARVESTED GREATER SAPHENOUS VEIN;  Surgeon: Rexene Alberts, MD;  Location: [place];  Service: Open Heart Surgery;  Laterality: N/A;   DILATION AND CURETTAGE OF UTERUS     ESOPHAGOGASTRODUODENOSCOPY N/A 02/09/2013   Procedure: ESOPHAGOGASTRODUODENOSCOPY (EGD);  Surgeon: [name], MD;  Location: [name];  Service: Endoscopy;  Laterality: N/A;   FOOT SPURS     HYSTEROSCOPY WITH D & C N/A 09/11/2012   Procedure: DILATATION AND CURETTAGE ;  Surgeon: [name], MD;  Location: [facility];  Service: Gynecology;  Laterality: N/A;   KNEE SURGERY  2007   LAPAROSCOPIC ASSISTED VAGINAL HYSTERECTOMY N/A 04/05/2016   Procedure: LAPAROSCOPIC ASSISTED VAGINAL HYSTERECTOMY possible BSO;  Surgeon: [name], MD;  Location: [facility];  Service: Gynecology;  Laterality: N/A;   LUMBAR LAMINECTOMY/DECOMPRESSION MICRODISCECTOMY N/A 10/20/2014   Procedure: MICRO LUMBER DECOMPRESSION L3-4 L4-5;  Surgeon: [name], MD;  [facility];  Service: Orthopedics;  Laterality: N/A;   LUMBAR LAMINECTOMY/DECOMPRESSION MICRODISCECTOMY Bilateral 10/13/2015   Procedure: MICRO LUMBAR DECOMPRESSION L5 - S1 AND REDO DECOMPRESSION L4 - L5 AND REMOVAL OF FACET CYST L4 - L5 2 LEVELS;  Surgeon: [name], MD;  [facility];  Service: Orthopedics;  Laterality: Bilateral;   PERIPHERAL VASCULAR ATHERECTOMY Right 06/12/2021   Procedure: PERIPHERAL VASCULAR ATHERECTOMY;  Surgeon: Waynetta Sandy, MD;  Location: [place];  Service: Cardiovascular;  Laterality: Right;  SFA/POP/AT   PERIPHERAL VASCULAR INTERVENTION Right 04/29/2019   Procedure: PERIPHERAL VASCULAR INTERVENTION;  Surgeon: Marty Heck, MD;  Location: [facility];  Service:  Cardiovascular;  Laterality: Right;   PERIPHERAL VASCULAR INTERVENTION Right 06/12/2021   Procedure: PERIPHERAL VASCULAR INTERVENTION;  Surgeon: Waynetta Sandy, MD;  Location: [facility];  Service: Cardiovascular;  Laterality: Right;  SFP/POPLITEAL/AT   PORT-A-CATH REMOVAL  04/17/2011   Procedure: REMOVAL PORT-A-CATH;  Surgeon: [name], MD;  Location: [facility];  Service: General;  Laterality: Left;   PORTACATH PLACEMENT  02/07/2011   Procedure: INSERTION PORT-A-CATH;  Surgeon: [name], MD;  [facility];  Service: General;  Laterality: N/A;   RIGHT/LEFT HEART CATH AND CORONARY ANGIOGRAPHY N/A 03/17/2020   Procedure: RIGHT/LEFT HEART CATH AND CORONARY ANGIOGRAPHY;  Surgeon: Burnell Blanks, MD;  Location: [facility];  Service: Cardiovascular;  Laterality: N/A;   TEE WITHOUT CARDIOVERSION N/A 05/04/2020   Procedure: TRANSESOPHAGEAL ECHOCARDIOGRAM (TEE);  Surgeon: Rexene Alberts, MD;  Location: [facility];  Service:  Open Heart Surgery;  Laterality: N/A;   UPPER GASTROINTESTINAL ENDOSCOPY     Patient Active Problem List   Diagnosis Date Noted   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[place]) 03/13/2021   Sinus tachycardia 03/11/2021   Elevated troponin 03/08/2021   Stroke-like symptoms 03/08/2021   Anemia of chronic disease 03/08/2021   Bilateral lower extremity edema 02/21/2021   Acute supraglottitis with epiglottitis 02/05/2021   Epiglottitis 02/05/2021   Reactive airway disease 01/20/2021   Diabetic peripheral neuropathy ([place]) 12/19/2020   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   Primary insomnia 11/02/2020   Irritation of eyelid 07/26/2020   S/P aortic valve replacement with bioprosthetic valve 05/04/2020   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   Peripheral artery disease (HCC)    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[place]) 04/07/2019   Low back pain 02/03/2019   Scoliosis deformity of spine 02/03/2019    Osteoarthritis of hip 11/17/2018   Claudication ([place])    Chronic headaches 11/12/2017   Degeneration of lumbar intervertebral disc 06/13/2017   Hyperkalemia 06/03/2017   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[place])    Mass of left adrenal gland ([place]) 04/19/2017   Spinal stenosis of lumbar region 10/20/2014   Hyperlipidemia 03/16/2013   Dense breasts 12/22/2012   Anxiety state 04/16/2012   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/16/2012   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   Osteoarthritis of knee 04/16/2012   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 female breast ([place]) 12/20/2010   History of breast cancer 12/05/2010   Depression 09/14/2008   GERD 09/14/2008   Type 2 diabetes mellitus with neurologic complication, with long-term current use of insulin ([place]) 05/20/2008   Coronary atherosclerosis 04/17/2007   Hypothyroidism 04/03/2007    ONSET DATE: 05/29/2021   REFERRING DIAG: Z61.096E (ICD-10-CM) - 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[place])   THERAPY DIAG:  Difficulty in walking, not elsewhere classified  Other abnormalities of gait and mobility  Unsteadiness on feet  Muscle weakness (generalized)  Rationale for Evaluation and Treatment Rehabilitation  SUBJECTIVE:   SUBJECTIVE STATEMENT: Patient is doing well, is using the roller walker today. She has been using the cane a little bit at home. No new falls to report.    PERTINENT HISTORY: PAD, GERD, CKD, DM with neuropathy, OA and RA, scoliosis, inflammatory polyarthropathy, breast CA, HLD, depression, CABG, Lx surgery, knee surgery    PAIN:  Are you having pain? No   PRECAUTIONS: Fall and Other: AKA   WEIGHT BEARING RESTRICTIONS No  FALLS: Has patient fallen in last 6 months? Yes. Number of falls 2  LIVING ENVIRONMENT: Lives with: lives with their spouse Lives in: House/apartment Home Access: [place]: One level Stairs: No Has following equipment at  home: Environmental consultant - 2 wheeled, Wheelchair (manual), Shower bench, bed side commode, Grab bars, and Ramped entry  PLOF: Independent, Independent with basic ADLs, Independent with gait, and Independent with transfers  PATIENT GOALS get up and get going     LOWER EXTREMITY MMT:   MMT Right eval Left eval Right 7/21 Left  7/21  Hip flexion 4- 4+ 4 4+  Hip extension 2+ 3- 3 3  Hip abduction 4+ 5 4+ 5  Hip adduction        Hip internal rotation        Hip external rotation        Knee flexion 4+   4+   Knee extension 4+  4+   Ankle dorsiflexion 4+   4+   Ankle plantarflexion        Ankle inversion        Ankle eversion        (Blank rows = not tested)      TODAY'S TREATMENT:  08/22/21 Nustep L5 x33mns Stairs up and down using both rails  Walking w/quad cane min-modA  Stepping over obstacles using quad cane min-modA Standing hip flexor in power tower w/RW 2x10  Bridges 2x10   08/17/21 Nustep L4 x680ms Navigating curbs outside and in parking lot with RW Walking w/quad cane 1HHA Step ups on 2", 4", 6" in // bars   08/15/21 Nustep L5 x6m66m  Walking with RW 2 big laps  Walking with cane 1 small lap  Side steps with quad cane modA Lateral step ups 2HHA 4" x10   08/11/21 Progress note reassess goals and MMT BERG 34/56 Nustep L3 x 6 mins  Walking with walker 2 laps Walking with quad cane ~[id number] x10 SL stance with quad cane (min-modA) 5s at best Standing hip ABD 2x10 each side, holding on to quad cane  08/08/21 Nustep with ramp ups for 30 intervals Ambulate with RW 4 laps needs rest after 2, walking backwards with RW   SL stance LLE holding on to RW on 1 side, at best 9 seconds Side steps 2" [facility]/13/23 NuStep L3 x 4 min  Ambulate ~75ft32fth RW and CGA, one seated rest break.  Ambulate ~225ft.2fh [facility] with a couple episodes of LOB due to prosthesis not locking, four seated rest breaks. Pt progressing from step to, to step through gait  pattern with cane. Verbal cues to keep prosthetic knee locked at times.  Standing B hip abduction with RW and MinA 2x10 each SLS on LLE 10 secs x2 with no AD and Mod A.  Sidestepping with MinA-ModA x2, one episode of prosthetic knee buckling.   07/26/21 Bridges 3x10 SK2C 30s SLR 2x10 minA to hold prosthetic at foot  Sidelying ext 3x10 Sidelying abd 2x10 Hip flexor stretch supine w/leg off table  Step ups 2HHA 4" minA  Walking with RW  Side steps in // bars  07/20/21  Bridges 2x10 Trunk rotations 1min S47mlying ext 3x10 Sidelying abd 3x10 Step up 6" w/non prosthetic side 2x10 Walking forwards/backwards in // bars Walking w/RW 2laps Nustep L3, 5mins  10m26/23 Prone stretch 3mins Br35mes 2x10 Sidelying hip abd 2x10 minA Sidelying hip ext 2x10 minA STS from elevated table 2x10 Standing hip flexion 2x10 Standing hip abd 2x10 Forwards/Backwards/sideways walking in //   07/13/21  Prone hip flexor stretch x3 minutes Sidelying hip ABD 2x10 B prosthetic on/MinA  Sidelying L hip extension 2x10  B prosthetic on/MinA  Bridges 1x15   WC and prosthetic management with mobility, safety with mobility- more impulsive with transfers today  Standing hip flexion B 1x10 close min guard cues for locking knee on prosthetic Standing hip ABD B 1x10 close in min guard cues for locking knee on prosthetic  Backward walking in // bars min guard for safety Cross midline reaches to L over prosthetic 1x10 for increased WB on residual limb  with practice locking/unlocking prosthetic     07/10/21 Prosthetic donning/doffing  Prone hip flexor stretch  Sidelying hip abd 2x10 Supine bridges 2x10 STS on elevated table  x10 Weight shifts in // bars  Side steps  Walking w/RW   07/06/21 Prosthetic doning/doffing Weight shifting side to  side and forwards Step up taps with 2HHA  Walking in parallel bars  Gait training w/walker  PATIENT EDUCATED ON FOLLOWING PROSTHETIC CARE: Prosthetic wear  tolerance: 8 hours  hours/day, 7 days/week Prosthetic weight bearing tolerance: 8  hours Other education  Skin check, Residual limb care, Care of non-amputated limb, Prosthetic cleaning, and Proper wear schedule/adjustment    PATIENT EDUCATION: Education details: exercise form and purpose, prosthetic management, safety with pivot transfers while wearing prosthetic  Person educated: Patient and Spouse Education method: Explanation, Demonstration, and Handouts Education comprehension: needs further education   HOME EXERCISE PROGRAM: [phone]  ASSESSMENT:  CLINICAL IMPRESSION: Patient is showing good progress with gait today. She is using her roller walker to get around and using her cane on the carpet at home. We continued with gait and stair training as well as some balance activities. Added in some strengthening exercises for hip flexors and glutes. Difficulty with power tower exercise due to not being able to control prosthetic placement. Walking with large base quad cane, only 2 instances of buckling at the end of 149f due to getting tired but otherwise is walking with better pace and stability. Tried side stepping over obstacles but she was very unsteady, switched to forward steps over obstacles. Requires min-modA  due to unsteadiness and fatigue. Worked on turns with quad cane, requires modA due to LOB and instability.    OBJECTIVE IMPAIRMENTS Abnormal gait, decreased activity tolerance, decreased balance, decreased coordination, decreased endurance, decreased knowledge of use of DME, decreased mobility, difficulty walking, decreased ROM, decreased strength, decreased safety awareness, increased fascial restrictions, increased muscle spasms, impaired flexibility, and postural dysfunction.   ACTIVITY LIMITATIONS standing, squatting, stairs, transfers, and locomotion level  PARTICIPATION LIMITATIONS: driving, shopping, community activity, and yard work  PERSONAL FACTORS Time since  onset of injury/illness/exacerbation are also affecting patient's functional outcome.   REHAB POTENTIAL: Good  CLINICAL DECISION MAKING: Evolving/moderate complexity  EVALUATION COMPLEXITY: Moderate   GOALS: Goals reviewed with patient? No  SHORT TERM GOALS: Target date: 09/19/2021    Will be compliant with appropriate HEP  Baseline: Goal status: Met  2.  Will be able to don/doff prosthetic with Mod(I) Baseline:  Goal status: Met  3.  Will be able to perform stand pivot transfers and ambulate household distances with no more than SBA and LRAD  Baseline:  Goal status: Met  4.  Will demonstrate thorough and complete skin check and verbalize appropriate progression of prosthetic wear schedule  Baseline:  Goal status: Met   LONG TERM GOALS: Target date: 10/17/2021    MMT in all weak groups to have improved by one grade  Baseline:  Goal status: progressing  2.  Hip flexor mm flexibility/mobility to be no more than 20% limited to help improve gait mechanics  Baseline:  Goal status: IN PROGRESS  3.  Will be able to ambulate at least 2531fwith LRAD and no more than SBA before fatiguing  Baseline: 5023fith quad cane, min-modA Goal status: IN PROGRESS  4.  Will be able to navigate at least 4 steps with LRAD safely with no more than min guard assist  Baseline:  Goal status: INITIAL  5.  Will be able to ambulate at least 60f70fer uneven surfaces like grass and gravel with LRAD and no more than min guard assist  Baseline:  Goal status: INITIAL  6.  Will score at least 45 on [place]  Baseline: 34/56 7/21 Goal status: INITIAL   PLAN: PT FREQUENCY: 2x/week  PT DURATION:  8 weeks  PLANNED INTERVENTIONS: Therapeutic exercises, Therapeutic activity, Neuromuscular re-education, Balance training, Gait training, Patient/Family education, Joint mobilization, Stair training, Prosthetic training, DME instructions, Wheelchair mobility training, Cryotherapy, Moist heat, Taping,  Ultrasound, Biofeedback, Manual therapy, and Re-evaluation  PLAN FOR NEXT SESSION: pre gait tasks, strength, gait training, prosthetic management    Andris Baumann, DPT 08/22/2021, 10:16 AM

## 2021-08-22 ENCOUNTER — Ambulatory Visit: Payer: Medicare Other | Attending: Family Medicine

## 2021-08-22 DIAGNOSIS — M6281 Muscle weakness (generalized): Secondary | ICD-10-CM | POA: Diagnosis present

## 2021-08-22 DIAGNOSIS — R2681 Unsteadiness on feet: Secondary | ICD-10-CM | POA: Insufficient documentation

## 2021-08-22 DIAGNOSIS — R2689 Other abnormalities of gait and mobility: Secondary | ICD-10-CM | POA: Insufficient documentation

## 2021-08-22 DIAGNOSIS — R262 Difficulty in walking, not elsewhere classified: Secondary | ICD-10-CM | POA: Insufficient documentation

## 2021-08-23 NOTE — Therapy (Signed)
OUTPATIENT PHYSICAL THERAPY PROSTHETICS TREATMENT   Patient Name: [name] MRN: [id number] DOB:January 13, 1941, 81 y.o., female Today's Date: 08/23/2021  PCP: Arlester Marker  REFERRING PROVIDER: Charlett Blake, MD                 Past Medical History:  Diagnosis Date   Allergy    Anemia    childhood   Anxiety    Aortic stenosis    Arterial stenosis ([place])    mesenteric   Arthritis    Breast cancer ([place]) 12/05/10   R breast, inv mammary, in situ,ER/PR +,HER2 -   Cancer ([place])    Carcinoma of breast treated with adjuvant chemotherapy ([place])    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[place])    Claudication ([place])    Coronary artery disease    stent 2007   Depression    Diabetes mellitus    Difficulty sleeping    Diverticulosis 12/10/03   Elevated cholesterol    Generalized weakness    GERD (gastroesophageal reflux disease)    Heart murmur    Hepatitis    as an infant   HSV-1 (herpes simplex virus 1) infection    Hyperlipidemia    Hypertension    Hypothyroidism    Osteoarthritis    PAD (peripheral artery disease) ([place])    Personal history of chemotherapy    Personal history of radiation therapy    Pneumonia    2014 september and March 2022   Rheumatoid arthritis ([place])    S/P aortic valve replacement with bioprosthetic valve 05/04/2020   [facility] Inspiris Resilia stented bovine pericardial tissue valve, size 23 mm   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   LIMA to D3, SVG to PDA, EVH via right thigh   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   Dr [name]   Spinal stenosis    Thyroid disease    Past Surgical History:  Procedure Laterality Date   ABDOMINAL AORTOGRAM W/LOWER EXTREMITY Bilateral 04/29/2019   Procedure: ABDOMINAL AORTOGRAM W/LOWER EXTREMITY;  Surgeon: Marty Heck, MD;  Location: [facility];  Service: Cardiovascular;  Laterality: Bilateral;   ABDOMINAL AORTOGRAM W/LOWER EXTREMITY Left 07/01/2019   Procedure: ABDOMINAL AORTOGRAM W/  Left LOWER EXTREMITY [place];  Surgeon: Marty Heck, MD;  Location: [facility];  Service: Cardiovascular;  Laterality: Left;   ABDOMINAL AORTOGRAM W/LOWER EXTREMITY Left 03/03/2021   Procedure: ABDOMINAL AORTOGRAM W/LOWER EXTREMITY;  Surgeon: Cherre Robins, MD;  Location: [facility];  Service: Cardiovascular;  Laterality: Left;   ABDOMINAL AORTOGRAM W/LOWER EXTREMITY Right 06/12/2021   Procedure: ABDOMINAL AORTOGRAM W/LOWER EXTREMITY;  Surgeon: Waynetta Sandy, MD;  Location: [facility];  Service: Cardiovascular;  Laterality: Right;  LOWER LEG   AMPUTATION Left 03/06/2021   Procedure: LEFT ABOVE KNEE AMPUTATION;  Surgeon: [name], MD;  Location: [facility];  Service: Vascular;  Laterality: Left;   ANTERIOR AND POSTERIOR REPAIR N/A 04/05/2016   Procedure: ANTERIOR (CYSTOCELE) AND POSTERIOR REPAIR (RECTOCELE);  Surgeon: [name], MD;  Location: [facility];  Service: Gynecology;  Laterality: N/A;   AORTIC VALVE REPLACEMENT N/A 05/04/2020   Procedure: AORTIC VALVE REPLACEMENT (AVR) USING INSPIRIS 23MM RESILIA AORTIC VALVE;  Surgeon: Rexene Alberts, MD;  Location: [facility];  Service: Open Heart Surgery;  Laterality: N/A;   BREAST BIOPSY     BREAST LUMPECTOMY  1983   benign biopsy   BREAST LUMPECTOMY Right 12/29/2010   snbx, ER/PR +, Her2 -,  0/1 node pos.   CARPAL TUNNEL RELEASE Bilateral 9/12,8,12   CHOLECYSTECTOMY     COLONOSCOPY N/A 02/09/2013   Procedure: COLONOSCOPY;  Surgeon: [name], MD;  [facility];  Service: Endoscopy;  Laterality: N/A;   COLONOSCOPY     CORONARY ANGIOPLASTY     CORONARY ARTERY BYPASS GRAFT N/A 05/04/2020   Procedure: CORONARY ARTERY BYPASS GRAFTING (CABG), ON PUMP, TIMES TWO, USING LEFT INTERNAL MAMMARY ARTERY AND RIGHT ENDOSCOPICALLY HARVESTED GREATER SAPHENOUS VEIN;  Surgeon: Rexene Alberts, MD;  Location: [facility];  Service: Open Heart Surgery;  Laterality: N/A;   DILATION AND CURETTAGE OF UTERUS      ESOPHAGOGASTRODUODENOSCOPY N/A 02/09/2013   Procedure: ESOPHAGOGASTRODUODENOSCOPY (EGD);  Surgeon: [name], MD;  Location: [name];  Service: Endoscopy;  Laterality: N/A;   FOOT SPURS     HYSTEROSCOPY WITH D & C N/A 09/11/2012   Procedure: DILATATION AND CURETTAGE ;  Surgeon: [name], MD;  Location: [facility];  Service: Gynecology;  Laterality: N/A;   KNEE SURGERY  2007   LAPAROSCOPIC ASSISTED VAGINAL HYSTERECTOMY N/A 04/05/2016   Procedure: LAPAROSCOPIC ASSISTED VAGINAL HYSTERECTOMY possible BSO;  Surgeon: [name], MD;  Location: [facility];  Service: Gynecology;  Laterality: N/A;   LUMBAR LAMINECTOMY/DECOMPRESSION MICRODISCECTOMY N/A 10/20/2014   Procedure: MICRO LUMBER DECOMPRESSION L3-4 L4-5;  Surgeon: [name], MD;  Location: [facility];  Service: Orthopedics;  Laterality: N/A;   LUMBAR LAMINECTOMY/DECOMPRESSION MICRODISCECTOMY Bilateral 10/13/2015   Procedure: MICRO LUMBAR DECOMPRESSION L5 - S1 AND REDO DECOMPRESSION L4 - L5 AND REMOVAL OF FACET CYST L4 - L5 2 LEVELS;  Surgeon: [name], MD;  Location: [facility];  Service: Orthopedics;  Laterality: Bilateral;   PERIPHERAL VASCULAR ATHERECTOMY Right 06/12/2021   Procedure: PERIPHERAL VASCULAR ATHERECTOMY;  Surgeon: Waynetta Sandy, MD;  Location: [facility];  Service: Cardiovascular;  Laterality: Right;  SFA/POP/AT   PERIPHERAL VASCULAR INTERVENTION Right 04/29/2019   Procedure: PERIPHERAL VASCULAR INTERVENTION;  Surgeon: Marty Heck, MD;  Location: [facility];  Service: Cardiovascular;  Laterality: Right;   PERIPHERAL VASCULAR INTERVENTION Right 06/12/2021   Procedure: PERIPHERAL VASCULAR INTERVENTION;  Surgeon: Waynetta Sandy, MD;  Location: [facility];  Service: Cardiovascular;  Laterality: Right;  SFP/POPLITEAL/AT   PORT-A-CATH REMOVAL  04/17/2011   Procedure: REMOVAL PORT-A-CATH;  Surgeon: [name], MD;  Location: [facility];  Service: General;   Laterality: Left;   PORTACATH PLACEMENT  02/07/2011   Procedure: INSERTION PORT-A-CATH;  Surgeon: [name], MD;  [facility];  Service: General;  Laterality: N/A;   RIGHT/LEFT HEART CATH AND CORONARY ANGIOGRAPHY N/A 03/17/2020   Procedure: RIGHT/LEFT HEART CATH AND CORONARY ANGIOGRAPHY;  Surgeon: Burnell Blanks, MD;  Location: [facility];  Service: Cardiovascular;  Laterality: N/A;   TEE WITHOUT CARDIOVERSION N/A 05/04/2020   Procedure: TRANSESOPHAGEAL ECHOCARDIOGRAM (TEE);  Surgeon: Rexene Alberts, MD;  Location: [facility];  Service: Open Heart Surgery;  Laterality: N/A;   UPPER GASTROINTESTINAL ENDOSCOPY     Patient Active Problem List   Diagnosis Date Noted   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[place]) 03/13/2021   Sinus tachycardia 03/11/2021   Elevated troponin 03/08/2021   Stroke-like symptoms 03/08/2021   Anemia of chronic disease 03/08/2021   Bilateral lower extremity edema 02/21/2021   Acute supraglottitis with epiglottitis 02/05/2021   Epiglottitis 02/05/2021   Reactive airway disease 01/20/2021   Diabetic peripheral neuropathy ([place]) 12/19/2020   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   Primary  insomnia 11/02/2020   Irritation of eyelid 07/26/2020   S/P aortic valve replacement with bioprosthetic valve 05/04/2020   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   Peripheral artery disease ([facility])    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[place]) 04/07/2019   Low back pain 02/03/2019   Scoliosis deformity of spine 02/03/2019   Osteoarthritis of hip 11/17/2018   Claudication ([place])    Chronic headaches 11/12/2017   Degeneration of lumbar intervertebral disc 06/13/2017   Hyperkalemia 06/03/2017   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[place])    Mass of left adrenal gland ([place]) 04/19/2017   Spinal stenosis of lumbar region 10/20/2014   Hyperlipidemia 03/16/2013   Dense breasts 12/22/2012   Anxiety state 04/16/2012   Erosive osteoarthritis  of both hands 04/16/2012   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   Osteoarthritis of knee 04/16/2012   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 female breast ([place]) 12/20/2010   History of breast cancer 12/05/2010   Depression 09/14/2008   GERD 09/14/2008   Type 2 diabetes mellitus with neurologic complication, with long-term current use of insulin ([place]) 05/20/2008   Coronary atherosclerosis 04/17/2007   Hypothyroidism 04/03/2007    ONSET DATE: 05/29/2021   REFERRING DIAG: Q22.297L (ICD-10-CM) - 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[place])   THERAPY DIAG:  No diagnosis found.  Rationale for Evaluation and Treatment Rehabilitation  SUBJECTIVE:   SUBJECTIVE STATEMENT: Using wheelchair if she has to carry around stuff, but is mostly using rolling walker. Prosthetic has buckled some but has not had any falls. Still having some phantom pain, not a whole lot but occasionally.   PERTINENT HISTORY: PAD, GERD, CKD, DM with neuropathy, OA and RA, scoliosis, inflammatory polyarthropathy, breast CA, HLD, depression, CABG, Lx surgery, knee surgery    PAIN:  Are you having pain? No   PRECAUTIONS: Fall and Other: AKA   WEIGHT BEARING RESTRICTIONS No  FALLS: Has patient fallen in last 6 months? Yes. Number of falls 2  LIVING ENVIRONMENT: Lives with: lives with their spouse Lives in: House/apartment Home Access: [place]: One level Stairs: No Has following equipment at home: Environmental consultant - 2 wheeled, Wheelchair (manual), Shower bench, bed side commode, Grab bars, and Ramped entry  PLOF: Independent, Independent with basic ADLs, Independent with gait, and Independent with transfers  PATIENT GOALS get up and get going     LOWER EXTREMITY MMT:   MMT Right eval Left eval Right 7/21 Left  7/21  Hip flexion 4- 4+ 4 4+  Hip extension 2+ 3- 3 3  Hip abduction 4+ 5 4+ 5  Hip adduction        Hip internal rotation        Hip external  rotation        Knee flexion 4+   4+   Knee extension 4+   4+   Ankle dorsiflexion 4+   4+   Ankle plantarflexion        Ankle inversion        Ankle eversion        (Blank rows = not tested)      TODAY'S TREATMENT:  08/24/21 Nustep L5 x31mns Walking w/quad cane x100 ft  STS 2x10 Standing marches w/quad cane x5 Step up on airex in RHilton Hotelstraining with quad cane    08/22/21 Nustep L5 x611ms Stairs up and down using both rails  Walking w/quad cane min-modA  Stepping over obstacles using quad cane  min-modA Standing hip flexor in power tower w/RW 2x10  Bridges 2x10   08/17/21 Nustep L4 x37mns Navigating curbs outside and in parking lot with RW Walking w/quad cane 1HHA Step ups on 2", 4", 6" in // bars   08/15/21 Nustep L5 x656ms  Walking with RW 2 big laps  Walking with cane 1 small lap  Side steps with quad cane modA Lateral step ups 2HHA 4" x10   08/11/21 Progress note reassess goals and MMT BERG 34/56 Nustep L3 x 6 mins  Walking with walker 2 laps Walking with quad cane ~5052fSTS x10 SL stance with quad cane (min-modA) 5s at best Standing hip ABD 2x10 each side, holding on to quad cane  08/08/21 Nustep with ramp ups for 30 intervals Ambulate with RW 4 laps needs rest after 2, walking backwards with RW   SL stance LLE holding on to RW on 1 side, at best 9 seconds Side steps 2" with [facility]7/13/23 NuStep L3 x 4 min  Ambulate ~21f2fith RW and CGA, one seated rest break.  Ambulate ~225ft53fth [facility]-minA with a couple episodes of LOB due to prosthesis not locking, four seated rest breaks. Pt progressing from step to, to step through gait pattern with cane. Verbal cues to keep prosthetic knee locked at times.  Standing B hip abduction with RW and MinA 2x10 each SLS on LLE 10 secs x2 with no AD and Mod A.  Sidestepping with MinA-ModA x2, one episode of prosthetic knee buckling.   07/26/21 Bridges 3x10 SK2C 30s SLR 2x10 minA to hold prosthetic at foot   Sidelying ext 3x10 Sidelying abd 2x10 Hip flexor stretch supine w/leg off table  Step ups 2HHA 4" minA  Walking with RW  Side steps in // bars  07/20/21  Bridges 2x10 Trunk rotations 1min 38melying ext 3x10 Sidelying abd 3x10 Step up 6" w/non prosthetic side 2x10 Walking forwards/backwards in // bars Walking w/RW 2laps Nustep L3, 5mins 88m/26/23 Prone stretch 3mins B23mges 2x10 Sidelying hip abd 2x10 minA Sidelying hip ext 2x10 minA STS from elevated table 2x10 Standing hip flexion 2x10 Standing hip abd 2x10 Forwards/Backwards/sideways walking in //   07/13/21  Prone hip flexor stretch x3 minutes Sidelying hip ABD 2x10 B prosthetic on/MinA  Sidelying L hip extension 2x10  B prosthetic on/MinA  Bridges 1x15   WC and prosthetic management with mobility, safety with mobility- more impulsive with transfers today  Standing hip flexion B 1x10 close min guard cues for locking knee on prosthetic Standing hip ABD B 1x10 close in min guard cues for locking knee on prosthetic  Backward walking in // bars min guard for safety Cross midline reaches to L over prosthetic 1x10 for increased WB on residual limb  with practice locking/unlocking prosthetic     07/10/21 Prosthetic donning/doffing  Prone hip flexor stretch  Sidelying hip abd 2x10 Supine bridges 2x10 STS on elevated table  x10 Weight shifts in // bars  Side steps  Walking w/RW   07/06/21 Prosthetic doning/doffing Weight shifting side to side and forwards Step up taps with 2HHA  Walking in parallel bars  Gait training w/walker  PATIENT EDUCATED ON FOLLOWING PROSTHETIC CARE: Prosthetic wear tolerance: 8 hours  hours/day, 7 days/week Prosthetic weight bearing tolerance: 8  hours Other education  Skin check, Residual limb care, Care of non-amputated limb, Prosthetic cleaning, and Proper wear schedule/adjustment    PATIENT EDUCATION: Education details: exercise form and purpose, prosthetic management, safety  with  pivot transfers while wearing prosthetic  Person educated: Patient and Spouse Education method: Explanation, Demonstration, and Handouts Education comprehension: needs further education   HOME EXERCISE PROGRAM: [phone]  ASSESSMENT:  CLINICAL IMPRESSION: Patient is able to walk 175f with quad cane, no buckling of prosthetic and only 1 instance of becoming unsteady but able to correct. We continued working on gait and stair training and LE functional movements. She still leans heavily on RLE with sit to stand and requires tactile cues to shift weight. Difficulty with standing marches, unable to lift up prosthetic side and states it feels heavier today than usual. Patient is able to demonstrate good sequencing with stair training using a SPC, still requires slight hand held touch with other hand on railing.    OBJECTIVE IMPAIRMENTS Abnormal gait, decreased activity tolerance, decreased balance, decreased coordination, decreased endurance, decreased knowledge of use of DME, decreased mobility, difficulty walking, decreased ROM, decreased strength, decreased safety awareness, increased fascial restrictions, increased muscle spasms, impaired flexibility, and postural dysfunction.   ACTIVITY LIMITATIONS standing, squatting, stairs, transfers, and locomotion level  PARTICIPATION LIMITATIONS: driving, shopping, community activity, and yard work  PERSONAL FACTORS Time since onset of injury/illness/exacerbation are also affecting patient's functional outcome.   REHAB POTENTIAL: Good  CLINICAL DECISION MAKING: Evolving/moderate complexity  EVALUATION COMPLEXITY: Moderate   GOALS: Goals reviewed with patient? No  SHORT TERM GOALS: Target date: 09/20/2021    Will be compliant with appropriate HEP  Baseline: Goal status: Met  2.  Will be able to don/doff prosthetic with Mod(I) Baseline:  Goal status: Met  3.  Will be able to perform stand pivot transfers and ambulate household  distances with no more than SBA and LRAD  Baseline:  Goal status: Met  4.  Will demonstrate thorough and complete skin check and verbalize appropriate progression of prosthetic wear schedule  Baseline:  Goal status: Met   LONG TERM GOALS: Target date: 10/18/2021    MMT in all weak groups to have improved by one grade  Baseline:  Goal status: progressing  2.  Hip flexor mm flexibility/mobility to be no more than 20% limited to help improve gait mechanics  Baseline:  Goal status: IN PROGRESS  3.  Will be able to ambulate at least 2559fwith LRAD and no more than SBA before fatiguing  Baseline: 5053fith quad cane, min-modA Goal status: IN PROGRESS  4.  Will be able to navigate at least 4 steps with LRAD safely with no more than min guard assist  Baseline:  Goal status: INITIAL  5.  Will be able to ambulate at least 40f90fer uneven surfaces like grass and gravel with LRAD and no more than min guard assist  Baseline:  Goal status: INITIAL  6.  Will score at least 45 on Berg  Baseline: 34/56 7/21 Goal status: INITIAL   PLAN: PT FREQUENCY: 2x/week  PT DURATION: 8 weeks  PLANNED INTERVENTIONS: Therapeutic exercises, Therapeutic activity, Neuromuscular re-education, Balance training, Gait training, Patient/Family education, Joint mobilization, Stair training, Prosthetic training, DME instructions, Wheelchair mobility training, Cryotherapy, Moist heat, Taping, Ultrasound, Biofeedback, Manual therapy, and Re-evaluation  PLAN FOR NEXT SESSION: pre gait tasks, strength, gait training, prosthetic management    MonaAndris BaumannT 08/23/2021, 3:49 PM

## 2021-08-24 ENCOUNTER — Ambulatory Visit: Payer: Medicare Other

## 2021-08-24 DIAGNOSIS — R262 Difficulty in walking, not elsewhere classified: Secondary | ICD-10-CM

## 2021-08-24 DIAGNOSIS — R2681 Unsteadiness on feet: Secondary | ICD-10-CM

## 2021-08-24 DIAGNOSIS — M6281 Muscle weakness (generalized): Secondary | ICD-10-CM

## 2021-08-24 DIAGNOSIS — R2689 Other abnormalities of gait and mobility: Secondary | ICD-10-CM

## 2021-08-28 NOTE — Therapy (Incomplete)
OUTPATIENT PHYSICAL THERAPY PROSTHETICS TREATMENT   Patient Name: [name] MRN: [id number] DOB:May 15, 1940, 81 y.o., female Today's Date: 08/29/2021  PCP: Arlester Marker  REFERRING PROVIDER: Charlett Blake, MD    PT End of Session - 08/29/21 0931     Visit Number 79    Authorization Type U[employer]    Authorization Time Period 07/04/21-08/29/24    Progress Note Due on Visit 10    PT Start Time 0930    PT Stop Time 1013    PT Time Calculation (min) 43 min    Equipment Utilized During Treatment Gait belt    Activity Tolerance Patient tolerated treatment well    Behavior During Therapy WFL for tasks assessed/performed                         Past Medical History:  Diagnosis Date   Allergy    Anemia    childhood   Anxiety    Aortic stenosis    Arterial stenosis (HCC)    mesenteric   Arthritis    Breast cancer ([place]) 12/05/10   R breast, inv mammary, in situ,ER/PR +,HER2 -   Cancer ([place])    Carcinoma of breast treated with adjuvant chemotherapy ([place])    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[place])    Claudication ([place])    Coronary artery disease    stent 2007   Depression    Diabetes mellitus    Difficulty sleeping    Diverticulosis 12/10/03   Elevated cholesterol    Generalized weakness    GERD (gastroesophageal reflux disease)    Heart murmur    Hepatitis    as an infant   HSV-1 (herpes simplex virus 1) infection    Hyperlipidemia    Hypertension    Hypothyroidism    Osteoarthritis    PAD (peripheral artery disease) ([place])    Personal history of chemotherapy    Personal history of radiation therapy    Pneumonia    2014 september and March 2022   Rheumatoid arthritis ([place])    S/P aortic valve replacement with bioprosthetic valve 05/04/2020   [facility] Inspiris Resilia stented bovine pericardial tissue valve, size 23 mm   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   LIMA to D3, SVG to PDA, EVH via right thigh   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   Dr  [name]   Spinal stenosis    Thyroid disease    Past Surgical History:  Procedure Laterality Date   ABDOMINAL AORTOGRAM W/LOWER EXTREMITY Bilateral 04/29/2019   Procedure: ABDOMINAL AORTOGRAM W/LOWER EXTREMITY;  Surgeon: Marty Heck, MD;  Location: [facility];  Service: Cardiovascular;  Laterality: Bilateral;   ABDOMINAL AORTOGRAM W/LOWER EXTREMITY Left 07/01/2019   Procedure: ABDOMINAL AORTOGRAM W/ Left LOWER EXTREMITY [place];  Surgeon: Marty Heck, MD;  Location: [facility];  Service: Cardiovascular;  Laterality: Left;   ABDOMINAL AORTOGRAM W/LOWER EXTREMITY Left 03/03/2021   Procedure: ABDOMINAL AORTOGRAM W/LOWER EXTREMITY;  Surgeon: Cherre Robins, MD;  Location: [facility];  Service: Cardiovascular;  Laterality: Left;   ABDOMINAL AORTOGRAM W/LOWER EXTREMITY Right 06/12/2021   Procedure: ABDOMINAL AORTOGRAM W/LOWER EXTREMITY;  Surgeon: Waynetta Sandy, MD;  Location: [facility];  Service: Cardiovascular;  Laterality: Right;  LOWER LEG   AMPUTATION Left 03/06/2021   Procedure: LEFT ABOVE KNEE AMPUTATION;  Surgeon: [name], MD;  Location: [facility];  Service: Vascular;  Laterality: Left;  ANTERIOR AND POSTERIOR REPAIR N/A 04/05/2016   Procedure: ANTERIOR (CYSTOCELE) AND POSTERIOR REPAIR (RECTOCELE);  Surgeon: [name], MD;  Location: [facility];  Service: Gynecology;  Laterality: N/A;   AORTIC VALVE REPLACEMENT N/A 05/04/2020   Procedure: AORTIC VALVE REPLACEMENT (AVR) USING INSPIRIS 23MM RESILIA AORTIC VALVE;  Surgeon: Rexene Alberts, MD;  Location: [facility];  Service: Open Heart Surgery;  Laterality: N/A;   BREAST BIOPSY     BREAST LUMPECTOMY  1983   benign biopsy   BREAST LUMPECTOMY Right 12/29/2010   snbx, ER/PR +, Her2 -, 0/1 node pos.   CARPAL TUNNEL RELEASE Bilateral 9/12,8,12   CHOLECYSTECTOMY     COLONOSCOPY N/A 02/09/2013   Procedure: COLONOSCOPY;  Surgeon: [name], MD;  [facility];  Service:  Endoscopy;  Laterality: N/A;   COLONOSCOPY     CORONARY ANGIOPLASTY     CORONARY ARTERY BYPASS GRAFT N/A 05/04/2020   Procedure: CORONARY ARTERY BYPASS GRAFTING (CABG), ON PUMP, TIMES TWO, USING LEFT INTERNAL MAMMARY ARTERY AND RIGHT ENDOSCOPICALLY HARVESTED GREATER SAPHENOUS VEIN;  Surgeon: Rexene Alberts, MD;  Location: [facility];  Service: Open Heart Surgery;  Laterality: N/A;   DILATION AND CURETTAGE OF UTERUS     ESOPHAGOGASTRODUODENOSCOPY N/A 02/09/2013   Procedure: ESOPHAGOGASTRODUODENOSCOPY (EGD);  Surgeon: [name], MD;  Location: [name];  Service: Endoscopy;  Laterality: N/A;   FOOT SPURS     HYSTEROSCOPY WITH D & C N/A 09/11/2012   Procedure: DILATATION AND CURETTAGE ;  Surgeon: [name], MD;  Location: [facility];  Service: Gynecology;  Laterality: N/A;   KNEE SURGERY  2007   LAPAROSCOPIC ASSISTED VAGINAL HYSTERECTOMY N/A 04/05/2016   Procedure: LAPAROSCOPIC ASSISTED VAGINAL HYSTERECTOMY possible BSO;  Surgeon: [name], MD;  Location: [facility];  Service: Gynecology;  Laterality: N/A;   LUMBAR LAMINECTOMY/DECOMPRESSION MICRODISCECTOMY N/A 10/20/2014   Procedure: MICRO LUMBER DECOMPRESSION L3-4 L4-5;  Surgeon: [name], MD;  [facility];  Service: Orthopedics;  Laterality: N/A;   LUMBAR LAMINECTOMY/DECOMPRESSION MICRODISCECTOMY Bilateral 10/13/2015   Procedure: MICRO LUMBAR DECOMPRESSION L5 - S1 AND REDO DECOMPRESSION L4 - L5 AND REMOVAL OF FACET CYST L4 - L5 2 LEVELS;  Surgeon: [name], MD;  [facility];  Service: Orthopedics;  Laterality: Bilateral;   PERIPHERAL VASCULAR ATHERECTOMY Right 06/12/2021   Procedure: PERIPHERAL VASCULAR ATHERECTOMY;  Surgeon: Waynetta Sandy, MD;  Location: [facility];  Service: Cardiovascular;  Laterality: Right;  SFA/POP/AT   PERIPHERAL VASCULAR INTERVENTION Right 04/29/2019   Procedure: PERIPHERAL VASCULAR INTERVENTION;  Surgeon: Marty Heck, MD;  Location: [facility];  Service:  Cardiovascular;  Laterality: Right;   PERIPHERAL VASCULAR INTERVENTION Right 06/12/2021   Procedure: PERIPHERAL VASCULAR INTERVENTION;  Surgeon: Waynetta Sandy, MD;  Location: [facility];  Service: Cardiovascular;  Laterality: Right;  SFP/POPLITEAL/AT   PORT-A-CATH REMOVAL  04/17/2011   Procedure: REMOVAL PORT-A-CATH;  Surgeon: [name], MD;  Location: [facility];  Service: General;  Laterality: Left;   PORTACATH PLACEMENT  02/07/2011   Procedure: INSERTION PORT-A-CATH;  Surgeon: [name], MD;  [facility];  Service: General;  Laterality: N/A;   RIGHT/LEFT HEART CATH AND CORONARY ANGIOGRAPHY N/A 03/17/2020   Procedure: RIGHT/LEFT HEART CATH AND CORONARY ANGIOGRAPHY;  Surgeon: Burnell Blanks, MD;  Location: [facility];  Service: Cardiovascular;  Laterality: N/A;   TEE WITHOUT CARDIOVERSION N/A 05/04/2020   Procedure: TRANSESOPHAGEAL ECHOCARDIOGRAM (TEE);  Surgeon: Rexene Alberts, MD;  Location: [facility];  Service:  Open Heart Surgery;  Laterality: N/A;   UPPER GASTROINTESTINAL ENDOSCOPY     Patient Active Problem List   Diagnosis Date Noted   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[place]) 03/13/2021   Sinus tachycardia 03/11/2021   Elevated troponin 03/08/2021   Stroke-like symptoms 03/08/2021   Anemia of chronic disease 03/08/2021   Bilateral lower extremity edema 02/21/2021   Acute supraglottitis with epiglottitis 02/05/2021   Epiglottitis 02/05/2021   Reactive airway disease 01/20/2021   Diabetic peripheral neuropathy ([place]) 12/19/2020   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   Primary insomnia 11/02/2020   Irritation of eyelid 07/26/2020   S/P aortic valve replacement with bioprosthetic valve 05/04/2020   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   Peripheral artery disease (HCC)    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[place]) 04/07/2019   Low back pain 02/03/2019   Scoliosis deformity of spine 02/03/2019    Osteoarthritis of hip 11/17/2018   Claudication ([place])    Chronic headaches 11/12/2017   Degeneration of lumbar intervertebral disc 06/13/2017   Hyperkalemia 06/03/2017   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[place])    Mass of left adrenal gland ([place]) 04/19/2017   Spinal stenosis of lumbar region 10/20/2014   Hyperlipidemia 03/16/2013   Dense breasts 12/22/2012   Anxiety state 04/16/2012   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/16/2012   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   Osteoarthritis of knee 04/16/2012   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 female breast ([place]) 12/20/2010   History of breast cancer 12/05/2010   Depression 09/14/2008   GERD 09/14/2008   Type 2 diabetes mellitus with neurologic complication, with long-term current use of insulin ([place]) 05/20/2008   Coronary atherosclerosis 04/17/2007   Hypothyroidism 04/03/2007    ONSET DATE: 05/29/2021   REFERRING DIAG: V40.981X (ICD-10-CM) - 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[place])   THERAPY DIAG:  Difficulty in walking, not elsewhere classified  Other abnormalities of gait and mobility  Unsteadiness on feet  Muscle weakness (generalized)  Rationale for Evaluation and Treatment Rehabilitation  SUBJECTIVE:   SUBJECTIVE STATEMENT: Patient is doing well, she states she hopes this is her last visit as she is pleased with her progress and knows she just has to push herself.    PERTINENT HISTORY: PAD, GERD, CKD, DM with neuropathy, OA and RA, scoliosis, inflammatory polyarthropathy, breast CA, HLD, depression, CABG, Lx surgery, knee surgery    PAIN:   Are you having pain? No   PRECAUTIONS: Fall and Other: AKA   WEIGHT BEARING RESTRICTIONS No  FALLS: Has patient fallen in last 6 months? Yes. Number of falls 2  LIVING ENVIRONMENT: Lives with: lives with their spouse Lives in: House/apartment Home Access: [place]: One level Stairs: No Has following  equipment at home: Environmental consultant - 2 wheeled, Wheelchair (manual), Shower bench, bed side commode, Grab bars, and Ramped entry  PLOF: Independent, Independent with basic ADLs, Independent with gait, and Independent with transfers  PATIENT GOALS get up and get going     LOWER EXTREMITY MMT:   MMT Right eval Left eval Right 7/21 Left  7/21  Hip flexion 4- 4+ 4 4+  Hip extension 2+ 3- 3 3  Hip abduction 4+ 5 4+ 5  Hip adduction        Hip internal rotation        Hip external rotation        Knee flexion 4+   4+   Knee extension  4+   4+   Ankle dorsiflexion 4+   4+   Ankle plantarflexion        Ankle inversion        Ankle eversion        (Blank rows = not tested)      TODAY'S TREATMENT:  08/29/21 1 lap outside w/RW  Walking on grass w/RW ~34ft  Navigating curbs w/RW  Walking w/quad cane indoors  Stairs w/quad cane  BERG  08/24/21 Nustep L5 x45mins Walking w/quad cane x100 ft  STS 2x10 Standing marches w/quad cane x5 Step up on airex in [employer] training with quad cane    08/22/21 Nustep L5 x51mins Stairs up and down using both rails  Walking w/quad cane min-modA  Stepping over obstacles using quad cane min-modA Standing hip flexor in power tower w/RW 2x10  Bridges 2x10   08/17/21 Nustep L4 x56mins Navigating curbs outside and in parking lot with RW Walking w/quad cane 1HHA Step ups on 2", 4", 6" in // bars   08/15/21 Nustep L5 x39mins  Walking with RW 2 big laps  Walking with cane 1 small lap  Side steps with quad cane modA Lateral step ups 2HHA 4" x10   08/11/21 Progress note reassess goals and MMT BERG 34/56 Nustep L3 x 6 mins  Walking with walker 2 laps Walking with quad cane ~28ft  STS x10 SL stance with quad cane (min-modA) 5s at best Standing hip ABD 2x10 each side, holding on to quad cane  08/08/21 Nustep with ramp ups for 30 intervals Ambulate with RW 4 laps needs rest after 2, walking backwards with RW   SL stance LLE holding on to RW on 1 side,  at best 9 seconds Side steps 2" with [facility]   08/03/21 NuStep L3 x 4 min  Ambulate ~53ft. With RW and CGA, one seated rest break.  Ambulate ~245ft. With [facility] and CGA-minA with a couple episodes of LOB due to prosthesis not locking, four seated rest breaks. Pt progressing from step to, to step through gait pattern with cane. Verbal cues to keep prosthetic knee locked at times.  Standing B hip abduction with RW and MinA 2x10 each SLS on LLE 10 secs x2 with no AD and Mod A.  Sidestepping with MinA-ModA x2, one episode of prosthetic knee buckling.   07/26/21 Bridges 3x10 SK2C 30s SLR 2x10 minA to hold prosthetic at foot  Sidelying ext 3x10 Sidelying abd 2x10 Hip flexor stretch supine w/leg off table  Step ups 2HHA 4" minA  Walking with RW  Side steps in // bars  07/20/21  Bridges 2x10 Trunk rotations 30min Sidelying ext 3x10 Sidelying abd 3x10 Step up 6" w/non prosthetic side 2x10 Walking forwards/backwards in // bars Walking w/RW 2laps Nustep L3, 3mins   07/17/21 Prone stretch 70mins Bridges 2x10 Sidelying hip abd 2x10 minA Sidelying hip ext 2x10 minA STS from elevated table 2x10 Standing hip flexion 2x10 Standing hip abd 2x10 Forwards/Backwards/sideways walking in //   07/13/21  Prone hip flexor stretch x3 minutes Sidelying hip ABD 2x10 B prosthetic on/MinA  Sidelying L hip extension 2x10  B prosthetic on/MinA  Bridges 1x15   WC and prosthetic management with mobility, safety with mobility- more impulsive with transfers today  Standing hip flexion B 1x10 close min guard cues for locking knee on prosthetic Standing hip ABD B 1x10 close in min guard cues for locking knee on prosthetic  Backward walking in // bars min guard for safety Cross midline reaches  to L over prosthetic 1x10 for increased WB on residual limb  with practice locking/unlocking prosthetic     07/10/21 Prosthetic donning/doffing  Prone hip flexor stretch  Sidelying hip abd 2x10 Supine bridges  2x10 STS on elevated table  x10 Weight shifts in // bars  Side steps  Walking w/RW   07/06/21 Prosthetic doning/doffing Weight shifting side to side and forwards Step up taps with 2HHA  Walking in parallel bars  Gait training w/walker  PATIENT EDUCATED ON FOLLOWING PROSTHETIC CARE: Prosthetic wear tolerance: 8 hours  hours/day, 7 days/week Prosthetic weight bearing tolerance: 8  hours Other education  Skin check, Residual limb care, Care of non-amputated limb, Prosthetic cleaning, and Proper wear schedule/adjustment    PATIENT EDUCATION: Education details: exercise form and purpose, prosthetic management, safety with pivot transfers while wearing prosthetic  Person educated: Patient and Spouse Education method: Explanation, Demonstration, and Handouts Education comprehension: needs further education   HOME EXERCISE PROGRAM: [phone]  ASSESSMENT:  CLINICAL IMPRESSION: Patient is doing well overall, is more steady with the rolling walker and using it as her primary means of getting around. She believes that she is capable of practicing walking with the cane in her home for a few more weeks and will be confident enough to use it. Patient was educated about scheduling more visits to help meet some of her remaining goals but she expresses that she feels pretty good about how far she has come. She was also educated on decreasing her risk for falls. She wants to be able to be discharged and if she feels like she is not progressing in a few months she will see her doctor about possibly coming back for more PT. We walked outside with her RW navigating curbs and walking on grass, she is able to do with CGA and minA once she starts to fatigue. Also completed a car transfer and educated about safer ways to get in and out. Updated HEP.    OBJECTIVE IMPAIRMENTS Abnormal gait, decreased activity tolerance, decreased balance, decreased coordination, decreased endurance, decreased knowledge of use  of DME, decreased mobility, difficulty walking, decreased ROM, decreased strength, decreased safety awareness, increased fascial restrictions, increased muscle spasms, impaired flexibility, and postural dysfunction.   ACTIVITY LIMITATIONS standing, squatting, stairs, transfers, and locomotion level  PARTICIPATION LIMITATIONS: driving, shopping, community activity, and yard work  PERSONAL FACTORS Time since onset of injury/illness/exacerbation are also affecting patient's functional outcome.   REHAB POTENTIAL: Good  CLINICAL DECISION MAKING: Evolving/moderate complexity  EVALUATION COMPLEXITY: Moderate   GOALS: Goals reviewed with patient? No  SHORT TERM GOALS: Target date: 09/26/2021    Will be compliant with appropriate HEP  Baseline: Goal status: Met  2.  Will be able to don/doff prosthetic with Mod(I) Baseline:  Goal status: Met  3.  Will be able to perform stand pivot transfers and ambulate household distances with no more than SBA and LRAD  Baseline:  Goal status: Met  4.  Will demonstrate thorough and complete skin check and verbalize appropriate progression of prosthetic wear schedule  Baseline:  Goal status: Met   LONG TERM GOALS: Target date: 10/24/2021    MMT in all weak groups to have improved by one grade  Baseline:  Goal status: ongoing  2.  Hip flexor mm flexibility/mobility to be no more than 20% limited to help improve gait mechanics  Baseline:  Goal status: MET  3.  Will be able to ambulate at least 232ft with LRAD and no more than SBA before fatiguing  Baseline: 276ft w/RW-8/8, 98ft with quad cane, min-modA Goal status: IN PROGRESS  4.  Will be able to navigate at least 4 steps with LRAD safely with no more than min guard assist  Baseline: using quad cane- 8/8 Goal status: MET  5.  Will be able to ambulate at least 19ft over uneven surfaces like grass and gravel with LRAD and no more than min guard assist  Baseline: w/RW- 8/8 Goal status:  MET  6.  Will score at least 45 on Berg  Baseline: 34/56 7/21, 40 /56 Goal status: IN PROGRESS   PLAN: PT FREQUENCY: 2x/week  PT DURATION: 8 weeks  PLANNED INTERVENTIONS: Therapeutic exercises, Therapeutic activity, Neuromuscular re-education, Balance training, Gait training, Patient/Family education, Joint mobilization, Stair training, Prosthetic training, DME instructions, Wheelchair mobility training, Cryotherapy, Moist heat, Taping, Ultrasound, Biofeedback, Manual therapy, and Re-evaluation  PLAN FOR NEXT SESSION: pre gait tasks, strength, gait training, prosthetic management   PHYSICAL THERAPY DISCHARGE SUMMARY  Visits from Start of Care: 15  Patient agrees to discharge. Patient goals were partially met. Patient is being discharged due to being pleased with the current functional level.   Andris Baumann, DPT 08/29/2021, 10:26 AM

## 2021-08-29 ENCOUNTER — Ambulatory Visit: Payer: Medicare Other

## 2021-08-29 DIAGNOSIS — M6281 Muscle weakness (generalized): Secondary | ICD-10-CM

## 2021-08-29 DIAGNOSIS — R2681 Unsteadiness on feet: Secondary | ICD-10-CM

## 2021-08-29 DIAGNOSIS — R262 Difficulty in walking, not elsewhere classified: Secondary | ICD-10-CM | POA: Diagnosis not present

## 2021-08-29 DIAGNOSIS — R2689 Other abnormalities of gait and mobility: Secondary | ICD-10-CM

## 2021-09-08 ENCOUNTER — Telehealth: Payer: Self-pay | Admitting: Family Medicine

## 2021-09-08 DIAGNOSIS — S78112A Complete traumatic amputation at level between left hip and knee, initial encounter: Secondary | ICD-10-CM

## 2021-09-08 NOTE — Telephone Encounter (Signed)
Caller Name: Verdene Lennert w/[facility] [employer] (partner with [facility] insurance) Call back phone #: [phone]  Reason for Call: pt will be going on vacation in 2 weeks and would like a rollator. Requesting order be sent to [facility].

## 2021-09-11 NOTE — Telephone Encounter (Signed)
Order faxed to [facility] @ [phone]. Patient notified [place] phone. Dm/cma

## 2021-09-12 ENCOUNTER — Ambulatory Visit (INDEPENDENT_AMBULATORY_CARE_PROVIDER_SITE_OTHER): Payer: Medicare Other

## 2021-09-12 DIAGNOSIS — Z0000 Encounter for general adult medical examination without abnormal findings: Secondary | ICD-10-CM | POA: Diagnosis not present

## 2021-09-12 NOTE — Patient Instructions (Signed)
[name] , Thank you for taking time to come for your Medicare Wellness Visit. I appreciate your ongoing commitment to your health goals. Please review the following plan we discussed and let me know if I can assist you in the future.   Screening recommendations/referrals: Colonoscopy: no longer required  Mammogram: no longer required  Bone Density: 04/16/2019 Recommended yearly ophthalmology/optometry visit for glaucoma screening and checkup Recommended yearly dental visit for hygiene and checkup  Vaccinations: Influenza vaccine: completed  Pneumococcal vaccine: completed  Tdap vaccine: due  Shingles vaccine: completed     Advanced directives: yes   Conditions/risks identified: none   Next appointment: none    Preventive Care 37 Years and Older, Female Preventive care refers to lifestyle choices and visits with your health care provider that can promote health and wellness. What does preventive care include? A yearly physical exam. This is also called an annual well check. Dental exams once or twice a year. Routine eye exams. Ask your health care provider how often you should have your eyes checked. Personal lifestyle choices, including: Daily care of your teeth and gums. Regular physical activity. Eating a healthy diet. Avoiding tobacco and drug use. Limiting alcohol use. Practicing safe sex. Taking low-dose aspirin every day. Taking vitamin and mineral supplements as recommended by your health care provider. What happens during an annual well check? The services and screenings done by your health care provider during your annual well check will depend on your age, overall health, lifestyle risk factors, and family history of disease. Counseling  Your health care provider may ask you questions about your: Alcohol use. Tobacco use. Drug use. Emotional well-being. Home and relationship well-being. Sexual activity. Eating habits. History of falls. Memory and  ability to understand (cognition). Work and work Statistician. Reproductive health. Screening  You may have the following tests or measurements: Height, weight, and BMI. Blood pressure. Lipid and cholesterol levels. These may be checked every 5 years, or more frequently if you are over 25 years old. Skin check. Lung cancer screening. You may have this screening every year starting at age 38 if you have a 30-pack-year history of smoking and currently smoke or have quit within the past 15 years. Fecal occult blood test (FOBT) of the stool. You may have this test every year starting at age 61. Flexible sigmoidoscopy or colonoscopy. You may have a sigmoidoscopy every 5 years or a colonoscopy every 10 years starting at age 85. Hepatitis C blood test. Hepatitis B blood test. Sexually transmitted disease (STD) testing. Diabetes screening. This is done by checking your blood sugar (glucose) after you have not eaten for a while (fasting). You may have this done every 1-3 years. Bone density scan. This is done to screen for osteoporosis. You may have this done starting at age 68. Mammogram. This may be done every 1-2 years. Talk to your health care provider about how often you should have regular mammograms. Talk with your health care provider about your test results, treatment options, and if necessary, the need for more tests. Vaccines  Your health care provider may recommend certain vaccines, such as: Influenza vaccine. This is recommended every year. Tetanus, diphtheria, and acellular pertussis (Tdap, Td) vaccine. You may need a Td booster every 10 years. Zoster vaccine. You may need this after age 40. Pneumococcal 13-valent conjugate (PCV13) vaccine. One dose is recommended after age 83. Pneumococcal polysaccharide (PPSV23) vaccine. One dose is recommended after age 20. Talk to your health care provider about which screenings and  vaccines you need and how often you need them. This information is  not intended to replace advice given to you by your health care provider. Make sure you discuss any questions you have with your health care provider. Document Released: 02/04/2015 Document Revised: 09/28/2015 Document Reviewed: 11/09/2014 Elsevier Interactive Patient Education  2017 [employer] Prevention in the Home Falls can cause injuries. They can happen to people of all ages. There are many things you can do to make your home safe and to help prevent falls. What can I do on the outside of my home? Regularly fix the edges of walkways and driveways and fix any cracks. Remove anything that might make you trip as you walk through a door, such as a raised step or threshold. Trim any bushes or trees on the path to your home. Use bright outdoor lighting. Clear any walking paths of anything that might make someone trip, such as rocks or tools. Regularly check to see if handrails are loose or broken. Make sure that both sides of any steps have handrails. Any raised decks and porches should have guardrails on the edges. Have any leaves, snow, or ice cleared regularly. Use sand or salt on walking paths during winter. Clean up any spills in your garage right away. This includes oil or grease spills. What can I do in the bathroom? Use night lights. Install grab bars by the toilet and in the tub and shower. Do not use towel bars as grab bars. Use non-skid mats or decals in the tub or shower. If you need to sit down in the shower, use a plastic, non-slip stool. Keep the floor dry. Clean up any water that spills on the floor as soon as it happens. Remove soap buildup in the tub or shower regularly. Attach bath mats securely with double-sided non-slip rug tape. Do not have throw rugs and other things on the floor that can make you trip. What can I do in the bedroom? Use night lights. Make sure that you have a light by your bed that is easy to reach. Do not use any sheets or blankets that  are too big for your bed. They should not hang down onto the floor. Have a firm chair that has side arms. You can use this for support while you get dressed. Do not have throw rugs and other things on the floor that can make you trip. What can I do in the kitchen? Clean up any spills right away. Avoid walking on wet floors. Keep items that you use a lot in easy-to-reach places. If you need to reach something above you, use a strong step stool that has a grab bar. Keep electrical cords out of the way. Do not use floor polish or wax that makes floors slippery. If you must use wax, use non-skid floor wax. Do not have throw rugs and other things on the floor that can make you trip. What can I do with my stairs? Do not leave any items on the stairs. Make sure that there are handrails on both sides of the stairs and use them. Fix handrails that are broken or loose. Make sure that handrails are as long as the stairways. Check any carpeting to make sure that it is firmly attached to the stairs. Fix any carpet that is loose or worn. Avoid having throw rugs at the top or bottom of the stairs. If you do have throw rugs, attach them to the floor with carpet tape. Make  sure that you have a light switch at the top of the stairs and the bottom of the stairs. If you do not have them, ask someone to add them for you. What else can I do to help prevent falls? Wear shoes that: Do not have high heels. Have rubber bottoms. Are comfortable and fit you well. Are closed at the toe. Do not wear sandals. If you use a stepladder: Make sure that it is fully opened. Do not climb a closed stepladder. Make sure that both sides of the stepladder are locked into place. Ask someone to hold it for you, if possible. Clearly mark and make sure that you can see: Any grab bars or handrails. First and last steps. Where the edge of each step is. Use tools that help you move around (mobility aids) if they are needed. These  include: Canes. Walkers. Scooters. Crutches. Turn on the lights when you go into a dark area. Replace any light bulbs as soon as they burn out. Set up your furniture so you have a clear path. Avoid moving your furniture around. If any of your floors are uneven, fix them. If there are any pets around you, be aware of where they are. Review your medicines with your doctor. Some medicines can make you feel dizzy. This can increase your chance of falling. Ask your doctor what other things that you can do to help prevent falls. This information is not intended to replace advice given to you by your health care provider. Make sure you discuss any questions you have with your health care provider. Document Released: 11/04/2008 Document Revised: 06/16/2015 Document Reviewed: 02/12/2014 Elsevier Interactive Patient Education  2017 [employer].

## 2021-09-12 NOTE — Progress Notes (Signed)
Subjective:   [name] is a 81 y.o. female who presents for Medicare Annual (Subsequent) preventive examination.   I connected with [name]  today by telephone and verified that I am speaking with the correct person using two identifiers. Location patient: home Location provider: work Persons participating in the virtual visit: patient, provider.   I discussed the limitations, risks, security and privacy concerns of performing an evaluation and management service by telephone and the availability of in person appointments. I also discussed with the patient that there may be a patient responsible charge related to this service. The patient expressed understanding and verbally consented to this telephonic visit.    Interactive audio and video telecommunications were attempted between this provider and patient, however failed, due to patient having technical difficulties OR patient did not have access to video capability.  We continued and completed visit with audio only.    Review of Systems     Cardiac Risk Factors include: advanced age (>26mn, >>60women);hypertension     Objective:    Today's Vitals   There is no height or weight on file to calculate BMI.     09/12/2021   11:05 AM 07/04/2021    2:02 PM 06/12/2021    8:37 AM 03/13/2021    4:49 PM 02/28/2021    6:45 PM 02/05/2021    7:32 AM 09/06/2020   10:08 AM  Advanced Directives  Does Patient Have a Medical Advance Directive? Yes Yes Yes Yes Yes Yes Yes  Type of AParamedicof AAngwinLiving will HShoreacresLiving will H[place]Living will HSan Luis ObispoLiving will HMount VernonLiving will  HEast [place] Does patient want to make changes to medical advance directive?  No - Patient declined  No - Patient declined No - Patient declined    Copy of HParisin Chart? No - copy requested No -  copy requested  Yes - validated most recent copy scanned in chart (See row information) Yes - validated most recent copy scanned in chart (See row information)  Yes - validated most recent copy scanned in chart (See row information)    Current Medications (verified) Outpatient Encounter Medications as of 09/12/2021  Medication Sig   Accu-Chek FastClix Lancets MISC USE TO CHECK BLOOD SUGARS 3 TIMES DAILY   amLODIPine Besylate (AMLODIPINE BES+SYRSPEND SF PO) Take by mouth.   aspirin EC 81 MG tablet Take 81 mg by mouth in the morning.   blood glucose meter kit and supplies Dispense based on patient and insurance preference. Use up to four times daily as directed. (FOR ICD-10 E10.9, E11.9).   Cholecalciferol (D3 5000 PO) Take by mouth daily.   clopidogrel (PLAVIX) 75 MG tablet Take 1 tablet (75 mg total) by mouth daily.   Evolocumab (REPATHA SURECLICK) 1915MG/ML SOAJ Inject 1 pen into the skin every 14 (fourteen) days. (Patient taking differently: Inject 140 mg into the skin every 14 (fourteen) days.)   glipiZIDE (GLUCOTROL XL) 10 MG 24 hr tablet Take 1 tablet (10 mg total) by mouth daily with breakfast.   glucose blood (ACCU-CHEK GUIDE) test strip TEST ONCE DAILY   Golimumab (SIMPONI ARIA IV) Inject into the vein every 2 (two) months.   icosapent Ethyl (VASCEPA) 1 g capsule Take 2 capsules (2 g total) by mouth 2 (two) times daily.   insulin glargine (LANTUS) 100 UNIT/ML Solostar Pen Inject 15 Units into the skin 2 (two) times  daily.   Insulin Pen Needle (CAREFINE PEN NEEDLES) 32G X 6 MM MISC 1 application by Does not apply route 2 (two) times daily.   levothyroxine (SYNTHROID) 125 MCG tablet Take 125 mcg by mouth daily before breakfast.   metoprolol tartrate (LOPRESSOR) 50 MG tablet Take 1 tablet (50 mg total) by mouth 2 (two) times daily.   pantoprazole (PROTONIX) 40 MG tablet Take 1 tablet (40 mg total) by mouth daily before breakfast.   PARoxetine (PAXIL) 20 MG tablet Take 1 tablet (20 mg total)  by mouth in the morning.   Semaglutide,0.25 or 0.5MG/DOS, 2 MG/1.5ML SOPN Inject 0.5 mg into the skin every Tuesday.   valACYclovir (VALTREX) 1000 MG tablet Take 1 tablet (1,000 mg total) by mouth daily as needed (Fever Blisters).   ascorbic acid (VITAMIN C) 250 MG tablet Take 1 tablet (250 mg total) by mouth 2 (two) times daily. (Patient not taking: Reported on 09/12/2021)   No facility-administered encounter medications on file as of 09/12/2021.    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[place])    Claudication ([place])    Coronary artery disease    stent 2007   Depression    Diabetes mellitus    Difficulty sleeping    Diverticulosis 12/10/03   Elevated cholesterol    Generalized weakness    GERD (gastroesophageal reflux disease)    Heart murmur    Hepatitis    as an infant   HSV-1 (herpes simplex virus 1) infection    Hyperlipidemia    Hypertension    Hypothyroidism    Osteoarthritis    PAD (peripheral artery disease) ([place])    Personal history of chemotherapy    Personal history of radiation therapy    Pneumonia    2014 september and March 2022   Rheumatoid arthritis ([place])    S/P aortic valve replacement with bioprosthetic valve 05/04/2020   [facility] Inspiris Resilia stented bovine pericardial tissue valve, size 23 mm   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   LIMA to D3, SVG to PDA, EVH via right thigh   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   Dr [name]   Spinal stenosis    Thyroid disease    Past Surgical History:  Procedure Laterality Date   ABDOMINAL AORTOGRAM W/LOWER EXTREMITY Bilateral 04/29/2019   Procedure: ABDOMINAL AORTOGRAM W/LOWER EXTREMITY;  Surgeon: Marty Heck, MD;  Location: [facility];  Service: Cardiovascular;  Laterality: Bilateral;   ABDOMINAL AORTOGRAM W/LOWER EXTREMITY Left 07/01/2019   Procedure: ABDOMINAL AORTOGRAM W/ Left LOWER EXTREMITY [place];  Surgeon: Marty Heck, MD;  Location: [facility];  Service: Cardiovascular;  Laterality: Left;   ABDOMINAL AORTOGRAM W/LOWER EXTREMITY Left 03/03/2021   Procedure: ABDOMINAL AORTOGRAM W/LOWER EXTREMITY;  Surgeon: Cherre Robins, MD;  Location: [facility];  Service: Cardiovascular;  Laterality: Left;   ABDOMINAL AORTOGRAM W/LOWER EXTREMITY Right 06/12/2021   Procedure:  ABDOMINAL AORTOGRAM W/LOWER EXTREMITY;  Surgeon: Waynetta Sandy, MD;  Location: [facility];  Service: Cardiovascular;  Laterality: Right;  LOWER LEG   AMPUTATION Left 03/06/2021   Procedure: LEFT ABOVE KNEE AMPUTATION;  Surgeon: [name], MD;  Location: [facility];  Service: Vascular;  Laterality: Left;   ANTERIOR AND POSTERIOR REPAIR N/A 04/05/2016   Procedure: ANTERIOR (CYSTOCELE) AND POSTERIOR REPAIR (RECTOCELE);  Surgeon: [name], MD;  Location: [facility];  Service: Gynecology;  Laterality: N/A;   AORTIC VALVE REPLACEMENT N/A 05/04/2020   Procedure: AORTIC VALVE REPLACEMENT (AVR) USING INSPIRIS 23MM RESILIA AORTIC VALVE;  Surgeon: Rexene Alberts, MD;  Location: [facility];  Service: Open Heart Surgery;  Laterality: N/A;   BREAST BIOPSY     BREAST LUMPECTOMY  1983   benign biopsy   BREAST LUMPECTOMY Right 12/29/2010   snbx, ER/PR +, Her2 -, 0/1 node pos.   CARPAL TUNNEL RELEASE Bilateral 9/12,8,12   CHOLECYSTECTOMY     COLONOSCOPY N/A 02/09/2013   Procedure: COLONOSCOPY;  Surgeon: [name], MD;   [facility];  Service: Endoscopy;  Laterality: N/A;   COLONOSCOPY     CORONARY ANGIOPLASTY     CORONARY ARTERY BYPASS GRAFT N/A 05/04/2020   Procedure: CORONARY ARTERY BYPASS GRAFTING (CABG), ON PUMP, TIMES TWO, USING LEFT INTERNAL MAMMARY ARTERY AND RIGHT ENDOSCOPICALLY HARVESTED GREATER SAPHENOUS VEIN;  Surgeon: Rexene Alberts, MD;  Location: [facility];  Service: Open Heart Surgery;  Laterality: N/A;   DILATION AND CURETTAGE OF UTERUS     ESOPHAGOGASTRODUODENOSCOPY N/A 02/09/2013   Procedure: ESOPHAGOGASTRODUODENOSCOPY (EGD);  Surgeon: [name], MD;  Location: [name];  Service: Endoscopy;  Laterality: N/A;   FOOT SPURS     HYSTEROSCOPY WITH D & C N/A 09/11/2012   Procedure: DILATATION AND CURETTAGE ;  Surgeon: [name], MD;  Location: [facility];  Service: Gynecology;  Laterality: N/A;   KNEE SURGERY  2007   LAPAROSCOPIC ASSISTED VAGINAL HYSTERECTOMY N/A 04/05/2016   Procedure: LAPAROSCOPIC ASSISTED VAGINAL HYSTERECTOMY possible BSO;  Surgeon: [name], MD;  Location: [facility];  Service: Gynecology;  Laterality: N/A;   LUMBAR LAMINECTOMY/DECOMPRESSION MICRODISCECTOMY N/A 10/20/2014   Procedure: MICRO LUMBER DECOMPRESSION L3-4 L4-5;  Surgeon: [name], MD;  [facility];  Service: Orthopedics;  Laterality: N/A;   LUMBAR LAMINECTOMY/DECOMPRESSION MICRODISCECTOMY Bilateral 10/13/2015   Procedure: MICRO LUMBAR DECOMPRESSION L5 - S1 AND REDO DECOMPRESSION L4 - L5 AND REMOVAL OF FACET CYST L4 - L5 2 LEVELS;  Surgeon: [name], MD;  [facility];  Service: Orthopedics;  Laterality: Bilateral;   PERIPHERAL VASCULAR ATHERECTOMY Right 06/12/2021   Procedure: PERIPHERAL VASCULAR ATHERECTOMY;  Surgeon: Waynetta Sandy, MD;  Location: [facility];  Service: Cardiovascular;  Laterality: Right;  SFA/POP/AT   PERIPHERAL VASCULAR INTERVENTION Right 04/29/2019   Procedure: PERIPHERAL VASCULAR INTERVENTION;  Surgeon: Marty Heck, MD;  Location: [facility];  Service: Cardiovascular;  Laterality: Right;   PERIPHERAL VASCULAR INTERVENTION Right 06/12/2021   Procedure: PERIPHERAL VASCULAR INTERVENTION;  Surgeon: Waynetta Sandy, MD;  Location: [facility];  Service: Cardiovascular;  Laterality: Right;  SFP/POPLITEAL/AT   PORT-A-CATH REMOVAL  04/17/2011   Procedure: REMOVAL PORT-A-CATH;  Surgeon: [name], MD;  Location: [facility];  Service: General;  Laterality: Left;   PORTACATH PLACEMENT  02/07/2011   Procedure: INSERTION PORT-A-CATH;  Surgeon: [name], MD;  [facility];  Service: General;  Laterality: N/A;   RIGHT/LEFT HEART CATH AND CORONARY  ANGIOGRAPHY N/A 03/17/2020   Procedure: RIGHT/LEFT HEART CATH AND CORONARY ANGIOGRAPHY;  Surgeon: Burnell Blanks, MD;  Location: [facility];  Service: Cardiovascular;  Laterality: N/A;   TEE WITHOUT CARDIOVERSION N/A 05/04/2020   Procedure: TRANSESOPHAGEAL ECHOCARDIOGRAM (TEE);  Surgeon: Rexene Alberts, MD;  Location: [facility];  Service: Open Heart Surgery;  Laterality: N/A;   UPPER GASTROINTESTINAL ENDOSCOPY     Family History  Problem Relation Age of Onset   Kidney disease Mother    Heart disease Mother    Throat cancer Father    Alcoholism Father    Heart attack Father    Lymphoma Brother 5   Heart attack Brother    Diabetes Brother    Hypertension Brother    Cancer Son    Colon cancer Neg Hx    Stroke Neg Hx    Esophageal cancer Neg Hx    Rectal cancer Neg Hx    Stomach cancer Neg Hx    Social History   Socioeconomic History   Marital status: Married    Spouse name: Not on file   Number of children: 3   Years of education: Not on file   Highest education level: Not on file  Occupational History   Occupation: retired-bookkeeping  Tobacco Use   Smoking status: Former    Packs/day: 2.00    Years: 40.00    Total pack years: 80.00    Types: Cigarettes    Quit date: 12/19/1997    Years since quitting:  23.7   Smokeless tobacco: Never  Vaping Use   Vaping Use: Never used  Substance and Sexual Activity   Alcohol use: Yes    Alcohol/week: 1.0 standard drink of alcohol    Types: 1 Glasses of wine per week    Comment: occasiona - maybe once a month   Drug use: No   Sexual activity: Not Currently    Comment: menarch age 6, P62, HRT X 10 YRS, MENOPAUSE MID 40'S  Other Topics Concern   Not on file  Social History Narrative   One daughter died from suicide at age 98.   Social Determinants of Health   Financial Resource Strain: Low Risk  (09/12/2021)   Overall Financial Resource Strain (CARDIA)    Difficulty of Paying Living Expenses: Not hard at all  Food Insecurity: No Food Insecurity (09/12/2021)   Hunger Vital Sign    Worried About Running Out of Food in the Last Year: Never true    Ran Out of Food in the Last Year: Never true  Transportation Needs: No Transportation Needs (09/12/2021)   PRAPARE - Hydrologist (Medical): No    Lack of Transportation (Non-Medical): No  Physical Activity: Insufficiently Active (09/12/2021)   Exercise Vital Sign    Days of Exercise per Week: 7 days    Minutes of Exercise per Session: 20 min  Stress: No Stress Concern Present (09/12/2021)   Croom    Feeling of Stress : Not at all  Social Connections: Moderately Isolated (09/12/2021)   Social Connection and Isolation Panel [NHANES]    Frequency of Communication with Friends and Family: Three times a week    Frequency of Social Gatherings with Friends and Family: Three times a week    Attends Religious Services: Never    Active Member of Clubs or Organizations: No    Attends Archivist Meetings: Never    Marital Status: Married  Tobacco Counseling Counseling given: Not Answered   Clinical Intake:  Pre-visit preparation completed: Yes  Pain : No/denies pain     Nutritional Risks:  None Diabetes: No  How often do you need to have someone help you when you read instructions, pamphlets, or other written materials from your doctor or pharmacy?: 1 - Never What is the last grade level you completed in school?: High School  Diabetic?yes  Nutrition Risk Assessment:  Has the patient had any N/V/D within the last 2 months?  No  Does the patient have any non-healing wounds?  No  Has the patient had any unintentional weight loss or weight gain?  No   Diabetes:  Is the patient diabetic?  Yes  If diabetic, was a CBG obtained today?  No  Did the patient bring in their glucometer from home?  No  How often do you monitor your CBG's? Twice .   Financial Strains and Diabetes Management:  Are you having any financial strains with the device, your supplies or your medication? No .  Does the patient want to be seen by Chronic Care Management for management of their diabetes?  No  Would the patient like to be referred to a Nutritionist or for Diabetic Management?  No   Diabetic Exams:  Diabetic Eye Exam: Overdue for diabetic eye exam. Pt has been advised about the importance in completing this exam. Patient advised to call and schedule an eye exam. Diabetic Foot Exam: Overdue, Pt has been advised about the importance in completing this exam. Pt is scheduled for diabetic foot exam on next office visit .   Interpreter Needed?: No  Information entered by :: L.Wilson,LPN   Activities of Daily Living    09/12/2021   11:07 AM 03/01/2021    4:21 PM  In your present state of health, do you have any difficulty performing the following activities:  Hearing? 0 0  Vision? 0 0  Difficulty concentrating or making decisions? 0 0  Walking or climbing stairs? 1 0  Dressing or bathing? 0 0  Doing errands, shopping? 1 0  Preparing Food and eating ? N   Using the Toilet? N   In the past six months, have you accidently leaked urine? N   Do you have problems with loss of bowel control? N    Managing your Medications? N   Managing your Finances? N   Housekeeping or managing your Housekeeping? N     Patient Care Team: [name], MD as PCP - General (Family Medicine) Josue Hector, MD as PCP - Cardiology (Cardiology) Josue Hector, MD as Consulting Physician (Cardiology) Waynetta Sandy, MD as Consulting Physician (Vascular Surgery) Germaine Pomfret, [facility] as Pharmacist (Pharmacist) Gavin Pound, MD as Consulting Physician (Rheumatology)  Indicate any recent Medical Services you may have received from other than [facility] providers in the past year (date may be approximate).     Assessment:   This is a routine wellness examination for Jone.  Hearing/Vision screen Vision Screening - Comments:: Annual eye exams   Dietary issues and exercise activities discussed: Current Exercise Habits: Home exercise routine, Type of exercise: walking, Time (Minutes): 20, Frequency (Times/Week): 5, Weekly Exercise (Minutes/Week): 100, Intensity: Mild, Exercise limited by: orthopedic condition(s) (new prosthetic leg)   Goals Addressed             This Visit's Progress    Patient Stated   On track    Eat healthier & start walking more  Depression Screen    09/12/2021   11:07 AM 09/12/2021   11:03 AM 08/04/2021    9:38 AM 05/05/2021    2:24 PM 03/29/2021   11:22 AM 02/02/2021    8:33 AM 09/06/2020   10:07 AM  PHQ 2/9 Scores  PHQ - 2 Score 0 0 0 0 0 0 0  PHQ- 9 Score    1  6     Fall Risk    09/12/2021   11:05 AM 08/04/2021    9:38 AM 05/05/2021    2:24 PM 09/06/2020    9:57 AM 07/26/2020    9:58 AM  Fall Risk   Falls in the past year? 1 1 0 0 0  Comment  Last fall at home in June 2023. No injury.     Number falls in past yr: 0 1  0   Injury with Fall? 0 0  0   Risk for fall due to : Impaired balance/gait;Orthopedic patient  Impaired balance/gait    Risk for fall due to: Comment prosthetic left leg      Follow up Falls evaluation completed;Education  provided;Falls prevention discussed   Falls evaluation completed;Falls prevention discussed     FALL RISK PREVENTION PERTAINING TO THE HOME:  Any stairs in or around the home? No  If so, are there any without handrails? No  Home free of loose throw rugs in walkways, pet beds, electrical cords, etc? Yes  Adequate lighting in your home to reduce risk of falls? Yes   ASSISTIVE DEVICES UTILIZED TO PREVENT FALLS:  Life alert? No  Use of a cane, walker or w/c? Yes  Grab bars in the bathroom? Yes  Shower chair or bench in shower? Yes  Elevated toilet seat or a handicapped toilet? Yes     Cognitive Function:  Normal cognitive status assessed by telephone conversation  by this Nurse Health Advisor. No abnormalities found.        09/12/2021   11:10 AM  6CIT Screen  What Year? 0 points  What month? 0 points  What time? 0 points  Count back from 20 0 points  Months in reverse 0 points  Repeat phrase 0 points  Total Score 0 points    Immunizations Immunization History  Administered Date(s) Administered   Fluad Quad(high Dose 65+) 11/20/2018   Influenza Split 12/07/2014   Influenza, High Dose Seasonal PF 11/26/2016   Influenza,inj,quad, With Preservative 02/22/2017, 11/20/2018   Influenza-Unspecified 10/31/2009, 12/01/2010, 12/03/2011, 11/05/2012, 11/05/2013, 10/25/2014, 11/27/2017, 10/07/2019, 11/28/2020   PFIZER(Purple Top)SARS-COV-2 Vaccination 02/12/2019, 03/05/2019, 10/24/2019   Pneumococcal Conjugate-13 07/30/2014   Pneumococcal Polysaccharide-23 11/19/2007, 11/15/2008   Pneumococcal-Unspecified 01/23/2010   Zoster Recombinat (Shingrix) 02/01/2011    TDAP status: Due, Education has been provided regarding the importance of this vaccine. Advised may receive this vaccine at local pharmacy or Health Dept. Aware to provide a copy of the vaccination record if obtained from local pharmacy or Health Dept. Verbalized acceptance and understanding.  Flu Vaccine status: Due,  Education has been provided regarding the importance of this vaccine. Advised may receive this vaccine at local pharmacy or Health Dept. Aware to provide a copy of the vaccination record if obtained from local pharmacy or Health Dept. Verbalized acceptance and understanding.  Pneumococcal vaccine status: Up to date  Covid-19 vaccine status: Completed vaccines  Qualifies for Shingles Vaccine? Yes   Zostavax completed Yes   Shingrix Completed?: Yes  Screening Tests Health Maintenance  Topic Date Due   Hepatitis C Screening  Never done  TETANUS/TDAP  Never done   Zoster Vaccines- Shingrix (2 of 2) 03/29/2011   INFLUENZA VACCINE  08/22/2021   URINE MICROALBUMIN  11/02/2021   OPHTHALMOLOGY EXAM  09/23/2021   HEMOGLOBIN A1C  01/02/2022   FOOT EXAM  02/02/2022   Pneumonia Vaccine 52+ Years old  Completed   DEXA SCAN  Completed   HPV VACCINES  Aged Out   COLONOSCOPY (Pts 45-9yr Insurance coverage will need to be confirmed)  Discontinued   COVID-19 Vaccine  Discontinued    Health Maintenance  Health Maintenance Due  Topic Date Due   Hepatitis C Screening  Never done   TETANUS/TDAP  Never done   Zoster Vaccines- Shingrix (2 of 2) 03/29/2011   INFLUENZA VACCINE  08/22/2021   URINE MICROALBUMIN  11/02/2021    Colorectal cancer screening: No longer required.   Mammogram status: No longer required due to age.  Bone Density status: Completed 04/16/2019. Results reflect: Bone density results: OSTEOPENIA. Repeat every 5 years.  Lung Cancer Screening: (Low Dose CT Chest recommended if Age 79-80years, 30 pack-year currently smoking OR have quit w/in 15years.) does not qualify.   Lung Cancer Screening Referral: n/a  Additional Screening:  Hepatitis C Screening: does not qualify;   Vision Screening: Recommended annual ophthalmology exams for early detection of glaucoma and other disorders of the eye. Is the patient up to date with their annual eye exam?  Yes  Who is the provider  or what is the name of the office in which the patient attends annual eye exams? Dr.Dunn  If pt is not established with a provider, would they like to be referred to a provider to establish care? No .   Dental Screening: Recommended annual dental exams for proper oral hygiene  Community Resource Referral / Chronic Care Management: CRR required this visit?  No   CCM required this visit?  No      Plan:     I have personally reviewed and noted the following in the patient's chart:   Medical and social history Use of alcohol, tobacco or illicit drugs  Current medications and supplements including opioid prescriptions. Patient is not currently taking opioid prescriptions. Functional ability and status Nutritional status Physical activity Advanced directives List of other physicians Hospitalizations, surgeries, and ER visits in previous 12 months Vitals Screenings to include cognitive, depression, and falls Referrals and appointments  In addition, I have reviewed and discussed with patient certain preventive protocols, quality metrics, and best practice recommendations. A written personalized care plan for preventive services as well as general preventive health recommendations were provided to patient.     LDaphane Shepherd LPN   [id number]  Nurse Notes: none

## 2021-09-13 ENCOUNTER — Telehealth: Payer: Self-pay

## 2021-09-13 NOTE — Progress Notes (Signed)
Chronic Care Management APPOINTMENT REMINDER   Called [name], No answer, left message of appointment on 09/14/2021 at 11:00 am via telephone visit with Junius Argyle , Pharm D. Notified to have all medications, supplements, blood pressure and/or blood sugar logs available during appointment and to return call if need to reschedule.  [facility] Pharmacist Assistant [phone]

## 2021-09-14 ENCOUNTER — Ambulatory Visit (INDEPENDENT_AMBULATORY_CARE_PROVIDER_SITE_OTHER): Payer: Medicare Other

## 2021-09-14 DIAGNOSIS — F3342 Major depressive disorder, recurrent, in full remission: Secondary | ICD-10-CM

## 2021-09-14 DIAGNOSIS — E1142 Type 2 diabetes mellitus with diabetic polyneuropathy: Secondary | ICD-10-CM

## 2021-09-14 NOTE — Progress Notes (Signed)
Chronic Care Management Pharmacy Note  09/21/2021 Name:  [name] MRN:  [id number] DOB:  03/10/1940  Summary: Patient presents for CCM follow-up.   Recommendations/Changes made from today's visit: Continue current medications  Plan: CPP follow-up 6 months.    Subjective: [name] is an 81 y.o. year old female who is a primary patient of Rudd, Lillette Boxer, MD.  The CCM team was consulted for assistance with disease management and care coordination needs.    Engaged with patient by telephone for follow up visit in response to provider referral for pharmacy case management and/or care coordination services.   Consent to Services:  The patient was given information about Chronic Care Management services, agreed to services, and gave verbal consent prior to initiation of services.  Please see initial visit note for detailed documentation.   Patient Care Team: [name], MD as PCP - General (Family Medicine) Josue Hector, MD as PCP - Cardiology (Cardiology) Josue Hector, MD as Consulting Physician (Cardiology) Waynetta Sandy, MD as Consulting Physician (Vascular Surgery) Germaine Pomfret, [facility] as Pharmacist (Pharmacist) Gavin Pound, MD as Consulting Physician (Rheumatology)  Recent office visits: 07/03/21: Patient presented to Dr. Gena Fray for follow-up.   Recent consult visits: 06/27/21: Patient presented to Karoline Caldwell, PA-C (Vascular) for follow-up.   Hospital visits: None in previous 6 months   Objective:  Lab Results  Component Value Date   CREATININE 1.70 (H) 06/12/2021   BUN 31 (H) 06/12/2021   GFR 28.59 (L) 02/21/2021   GFRNONAA 35 (L) 03/16/2021   GFRAA 32 (L) 03/11/2020   NA 139 06/12/2021   K 4.3 06/12/2021   CALCIUM 8.8 (L) 03/16/2021   CO2 23 03/16/2021   GLUCOSE 158 (H) 07/03/2021    Lab Results  Component Value Date/Time   HGBA1C 7.0 (H) 07/03/2021 09:50 AM   HGBA1C 11.6 (H) 03/01/2021 01:28 AM   HGBA1C 10.0  05/22/2018 01:59 PM   HGBA1C 8.4 03/17/2018 11:15 AM   GFR 28.59 (L) 02/21/2021 12:09 PM   GFR 28.40 (L) 02/13/2021 11:48 AM   MICROALBUR 33.6 (H) 11/02/2020 09:40 AM   MICROALBUR 25.9 (H) 11/20/2019 10:02 AM   MICROALBUR 150 06/03/2017 08:28 AM    Last diabetic Eye exam:  Lab Results  Component Value Date/Time   HMDIABEYEEXA No Retinopathy 09/23/2020 12:00 AM    Last diabetic Foot exam: No results found for: "HMDIABFOOTEX"   Lab Results  Component Value Date   CHOL 104 03/04/2021   HDL 38 (L) 03/04/2021   LDLCALC 35 03/04/2021   LDLDIRECT 172.0 11/02/2020   TRIG 154 (H) 03/04/2021   CHOLHDL 2.7 03/04/2021       Latest Ref Rng & Units 03/14/2021    5:07 AM 03/12/2021    1:32 AM 03/09/2021    2:55 AM  Hepatic Function  Total Protein 6.5 - 8.1 g/dL 6.8     Albumin 3.5 - 5.0 g/dL 2.9  2.7  2.8   AST 15 - 41 U/L 21     ALT 0 - 44 U/L 15     Alk Phosphatase 38 - 126 U/L 65     Total Bilirubin 0.3 - 1.2 mg/dL 0.3       Lab Results  Component Value Date/Time   TSH 1.08 07/03/2021 09:50 AM   TSH 5.599 (H) 03/01/2021 04:23 AM   TSH 5.476 (H) 03/01/2021 01:28 AM   TSH 18.50 (H) 02/02/2021 08:26 AM   FREET4 1.14 06/23/2019 08:06 AM  FREET4 0.97 05/06/2019 08:59 AM       Latest Ref Rng & Units 06/12/2021    8:45 AM 03/14/2021    5:07 AM 03/12/2021    1:32 AM  CBC  WBC 4.0 - 10.5 K/uL  11.7  9.4   Hemoglobin 12.0 - 15.0 g/dL 12.9  10.9  10.0   Hematocrit 36.0 - 46.0 % 38.0  32.9  31.6   Platelets 150 - 400 K/uL  428  373     Lab Results  Component Value Date/Time   VD25OH 49.46 11/02/2020 09:40 AM   VD25OH 55.05 11/03/2018 09:16 AM    Clinical ASCVD: No  The ASCVD Risk score (Arnett DK, et al., 2019) failed to calculate for the following reasons:   The patient has a prior MI or stroke diagnosis       09/12/2021   11:07 AM 09/12/2021   11:03 AM 08/04/2021    9:38 AM  Depression screen PHQ 2/9  Decreased Interest 0 0 0  Down, Depressed, Hopeless 0 0 0  PHQ - 2  Score 0 0 0    Social History   Tobacco Use  Smoking Status Former   Packs/day: 2.00   Years: 40.00   Total pack years: 80.00   Types: Cigarettes   Quit date: 12/19/1997   Years since quitting: 23.7  Smokeless Tobacco Never   BP Readings from Last 3 Encounters:  08/04/21 128/71  07/28/21 (!) 122/50  07/26/21 (!) 161/77   Pulse Readings from Last 3 Encounters:  08/04/21 80  07/28/21 81  07/26/21 80   Wt Readings from Last 3 Encounters:  08/04/21 140 lb (63.5 kg)  07/28/21 144 lb (65.3 kg)  07/26/21 143 lb (64.9 kg)   BMI Readings from Last 3 Encounters:  08/04/21 24.80 kg/m  07/28/21 25.51 kg/m  07/26/21 25.33 kg/m    Assessment/Interventions: Review of patient past medical history, allergies, medications, health status, including review of consultants reports, laboratory and other test data, was performed as part of comprehensive evaluation and provision of chronic care management services.   SDOH:  (Social Determinants of Health) assessments and interventions performed: Yes   SDOH Screenings   Alcohol Screen: Low Risk  (09/12/2021)   Alcohol Screen    Last Alcohol Screening Score (AUDIT): 1  Depression (PHQ2-9): Low Risk  (09/12/2021)   Depression (PHQ2-9)    PHQ-2 Score: 0  Financial Resource Strain: Low Risk  (09/12/2021)   Overall Financial Resource Strain (CARDIA)    Difficulty of Paying Living Expenses: Not hard at all  Food Insecurity: No Food Insecurity (09/12/2021)   Hunger Vital Sign    Worried About Running Out of Food in the Last Year: Never true    Ran Out of Food in the Last Year: Never true  Housing: Low Risk  (09/12/2021)   Housing    Last Housing Risk Score: 0  Physical Activity: Insufficiently Active (09/12/2021)   Exercise Vital Sign    Days of Exercise per Week: 7 days    Minutes of Exercise per Session: 20 min  Social Connections: Moderately Isolated (09/12/2021)   Social Connection and Isolation Panel [NHANES]    Frequency of  Communication with Friends and Family: Three times a week    Frequency of Social Gatherings with Friends and Family: Three times a week    Attends Religious Services: Never    Active Member of Clubs or Organizations: No    Attends Archivist Meetings: Never    Marital Status: Married  Stress: No Stress Concern Present (09/12/2021)   [place]    Feeling of Stress : Not at all  Tobacco Use: Medium Risk (09/12/2021)   Patient History    Smoking Tobacco Use: Former    Smokeless Tobacco Use: Never    Passive Exposure: Not on file  Transportation Needs: No Transportation Needs (09/12/2021)   PRAPARE - Transportation    Lack of Transportation (Medical): No    Lack of Transportation (Non-Medical): No    CCM Care Plan  Allergies  Allergen Reactions   Codeine Nausea And Vomiting and Other (See Comments)    Severe stomach cramps, also Other reaction(s): Unknown   Antihistamines, Diphenhydramine-Type Other (See Comments)    Causes hyperactivity   Diphenhydramine     Other reaction(s): Unknown   Folic Acid Other (See Comments)    Other reaction(s): tongue pain   Gabapentin     Urinary incontinence   Lyrica [Pregabalin]     Double vision/falls   Methotrexate Other (See Comments)    Other reaction(s): oral ulcers   Oxycodone     Hallucinations/crazy feelings   Rheumate [Fish Oil]     Other reaction(s): hypoglycemia   Statins Other (See Comments)    Joint pains Other reaction(s): Unknown   Sulfa Antibiotics Other (See Comments)    Reaction not recalled   Erythromycin Hives, Rash and Other (See Comments)    Allergic due to dental work from 50 years ago. It was used in packing and resulted in rash/hives inside and outside of mouth.   Trulicity [Dulaglutide] Itching    Medications Reviewed Today     Reviewed by Daphane Shepherd, LPN (Licensed Practical Nurse) on 09/12/21 at 74  Med List Status: <None>    Medication Order Taking? Sig Documenting Provider Last Dose Status Informant  Accu-Chek FastClix Lancets MISC [id number] Yes USE TO CHECK BLOOD SUGARS 3 TIMES DAILY Cirigliano, Garvin Fila, DO Taking Active Self  amLODIPine Besylate (AMLODIPINE BES+SYRSPEND SF PO) [id number] Yes Take by mouth. [provider] Taking Active   ascorbic acid (VITAMIN C) 250 MG tablet [id number] No Take 1 tablet (250 mg total) by mouth 2 (two) times daily.  Patient not taking: Reported on 09/12/2021   Flora Lipps Not Taking Active Self  aspirin EC 81 MG tablet [id number] Yes Take 81 mg by mouth in the morning. [provider] Taking Active Self  blood glucose meter kit and supplies [id number] Yes Dispense based on patient and insurance preference. Use up to four times daily as directed. (FOR ICD-10 E10.9, E11.9). [name], MD Taking Active Self  Cholecalciferol (D3 5000 PO) [id number] Yes Take by mouth daily. [provider] Taking Active   clopidogrel (PLAVIX) 75 MG tablet [id number] Yes Take 1 tablet (75 mg total) by mouth daily. Waynetta Sandy, MD Taking Active   Evolocumab [facility] SURECLICK) 371 MG/ML Darden Palmer [id number] Yes Inject 1 pen into the skin every 14 (fourteen) days.  Patient taking differently: Inject 140 mg into the skin every 14 (fourteen) days.   Josue Hector, MD Taking Active Self  glipiZIDE (GLUCOTROL XL) 10 MG 24 hr tablet [id number] Yes Take 1 tablet (10 mg total) by mouth daily with breakfast. [name], MD Taking Active Self  glucose blood (ACCU-CHEK GUIDE) test strip [id number] Yes TEST ONCE DAILY [name], MD Taking Active Self  Golimumab ([place] IV) [id number] Yes Inject into the vein every 2 (two) months. [provider]  Taking Active Self  icosapent Ethyl (VASCEPA) 1 g capsule [id number] Yes Take 2 capsules (2 g total) by mouth 2 (two) times daily. Josue Hector, MD Taking Active Self  insulin glargine (LANTUS) 100 UNIT/ML  Solostar Pen [id number] Yes Inject 15 Units into the skin 2 (two) times daily. [name], MD Taking Active Self  Insulin Pen Needle (CAREFINE PEN NEEDLES) 32G X 6 MM MISC [id number] Yes 1 application by Does not apply route 2 (two) times daily. Bary Leriche, PA-C Taking Active Self  levothyroxine (SYNTHROID) 125 MCG tablet [id number] Yes Take 125 mcg by mouth daily before breakfast. [provider] Taking Active Self  metoprolol tartrate (LOPRESSOR) 50 MG tablet [id number] Yes Take 1 tablet (50 mg total) by mouth 2 (two) times daily. Josue Hector, MD Taking Active   pantoprazole (PROTONIX) 40 MG tablet [id number] Yes Take 1 tablet (40 mg total) by mouth daily before breakfast. Aline August, MD Taking Active Self  PARoxetine (PAXIL) 20 MG tablet [id number] Yes Take 1 tablet (20 mg total) by mouth in the morning. [name], MD Taking Active Self  Semaglutide,0.25 or 0.5MG/DOS, 2 MG/1.5ML Bonney Aid [id number] Yes Inject 0.5 mg into the skin every Tuesday. [name], MD Taking Active   valACYclovir (VALTREX) 1000 MG tablet [id number] Yes Take 1 tablet (1,000 mg total) by mouth daily as needed (Fever Blisters). 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[place]) 03/13/2021   Sinus tachycardia 03/11/2021   Elevated troponin 03/08/2021   Stroke-like symptoms 03/08/2021   Anemia of chronic disease 03/08/2021   Bilateral lower extremity edema 02/21/2021   Acute supraglottitis with epiglottitis 02/05/2021   Epiglottitis 02/05/2021   Reactive airway disease 01/20/2021   Diabetic peripheral neuropathy ([place]) 12/19/2020   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   Primary insomnia 11/02/2020   Irritation of eyelid 07/26/2020   S/P aortic valve replacement with bioprosthetic valve 05/04/2020   S/P CABG x  2 05/04/2020   Peripheral artery disease (HCC)    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[place]) 04/07/2019   Low back pain 02/03/2019   Scoliosis deformity of spine 02/03/2019   Osteoarthritis of hip 11/17/2018   Claudication ([place])    Chronic headaches 11/12/2017   Degeneration of lumbar intervertebral disc 06/13/2017   Hyperkalemia 06/03/2017   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[place])    Mass of left adrenal gland ([place]) 04/19/2017   Spinal stenosis of lumbar region 10/20/2014   Hyperlipidemia 03/16/2013   Dense breasts 12/22/2012   Anxiety state 04/16/2012   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/16/2012   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   Osteoarthritis of knee 04/16/2012   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 female breast ([place]) 12/20/2010   History of breast cancer 12/05/2010   Depression 09/14/2008   GERD 09/14/2008   Type 2 diabetes mellitus with neurologic complication, with long-term current use of insulin ([place]) 05/20/2008   Coronary atherosclerosis 04/17/2007   Hypothyroidism 04/03/2007    Immunization History  Administered Date(s) Administered   Fluad Quad(high Dose 65+) 11/20/2018   Influenza Split 12/07/2014   Influenza, High Dose Seasonal PF 11/26/2016   Influenza,inj,quad,  With Preservative 02/22/2017, 11/20/2018   Influenza-Unspecified 10/31/2009, 12/01/2010, 12/03/2011, 11/05/2012, 11/05/2013, 10/25/2014, 11/27/2017, 10/07/2019, 11/28/2020   PFIZER(Purple Top)SARS-COV-2 Vaccination 02/12/2019, 03/05/2019, 10/24/2019   Pneumococcal Conjugate-13 07/30/2014   Pneumococcal Polysaccharide-23 11/19/2007, 11/15/2008   Pneumococcal-Unspecified 01/23/2010   Zoster Recombinat (Shingrix) 02/01/2011    Conditions to be addressed/monitored:  Hypertension, Hyperlipidemia, Diabetes, Coronary Artery Disease, GERD, Chronic Kidney Disease, Hypothyroidism, Depression, Osteoarthritis, and Peripheral Artery Disease  Care Plan  : General Pharmacy (Adult)  Updates made by Germaine Pomfret, RPH since 09/21/2021 12:00 AM     Problem: Hypertension, Hyperlipidemia, Diabetes, Coronary Artery Disease, GERD, Chronic Kidney Disease, Hypothyroidism, Depression, Osteoarthritis, and Peripheral Artery Disease   Priority: High     Long-Range Goal: Patient-Specific Goal   Start Date: 12/30/2020  Expected End Date: 12/30/2021  This Visit's Progress: On track  Recent Progress: On track  Priority: High  Note:   Current Barriers:  Unable to maintain control of diabetes  Pharmacist Clinical Goal(s):  Patient will achieve control of diabetes as evidenced by A1c less than 8% through collaboration with PharmD and provider.   Interventions: 1:1 collaboration with [name], MD regarding development and update of comprehensive plan of care as evidenced by provider attestation and co-signature Inter-disciplinary care team collaboration (see longitudinal plan of care) Comprehensive medication review performed; medication list updated in electronic medical record  Hypertension (BP goal <140/90) -Controlled -Current treatment: Amlodipine 10 mg  Metoprolol tartrate 50 mg twice daily -Medications previously tried: losartan  -Current home readings: Does not monitor regularly at home  -Denies hypotensive/hypertensive symptoms -Counseled to monitor BP at home weekly, document, and provide log at future appointments -Recommended to continue current medication  Hyperlipidemia: (LDL goal < 55) -Uncontrolled -History of PAD, 80 year smoking history.  -Current treatment: Repatha 140 mg every 14 days  Vascepa 1g 2 caps twice daily -Current treatment: Aspirin 81 mg daily  Clopidogrel 75 mg daily  -Medications previously tried: Eztimibe, Aotrvastatin, Rosuvastatin, Livalo, colesevelam, cholestyramine  -Tolerating lipid regimen well, reports it is affordable. Counseled patient on importance of limiting fat in diet in addition to  medication changes. -Recommended to continue current medication  Diabetes (A1c goal <8%) -Controlled -Complications: Peripheral neuropathy, nephropathy, PAD.  -Current medications: Glipizide XL 10 mg daily  Lantus 15 units twice daily  Ozempic 0.5 mg weekly  -Medications previously tried: Trulicity (itching), -Current home glucose readings fasting glucose: 88,94, 154,  -Denies hypoglycemic/hyperglycemic symptoms  Depression (Goal: maintain symptom remission) -Not ideally controlled -Current treatment: Paroxetine 20 mg daily  -Medications previously tried/failed: NA  -PHQ9: 0 -Recommended to continue current medication  Insomnia (Goal: Improve sleep) -Controlled -Current treatment  None -Medications previously tried: NA -Recommended to continue current medication  Hypothyroidism (Goal: Maintain thyroid function) -Controlled -Current treatment  Levothyroxine 100 mcg daily  -Medications previously tried: NA  -Recommended to continue current medication  GERD (Goal: Prevent heartburn symptoms) -Controlled -Current treatment  Pantoprazole 40 mg daily  -Medications previously tried: NA -Patient reports rebound symptoms if she skips her pantoprazole.   -Recommended to continue current medication  Chronic Pain (Goal: Minimize pain) -Controlled -Current treatment  Acetaminophen CR 650 mg every 8 hours as needed Simponi injections every 2 months  -Medications previously tried: NA -Counseled to avoid NSAID use.  -Counseled to limit acetaminophen to 3000 mg daily.   -Recommended to continue current medication  Chronic Kidney Disease Stage 4  -All medications assessed for renal dosing and appropriateness in chronic kidney disease. -Recommended to continue current medication  Patient Goals/Self-Care Activities Patient  will:  - check glucose daily before breakfast, document, and provide at future appointments check blood pressure weekly, document, and provide at future  appointments  Follow Up Plan: Telephone follow up appointment with care management team member scheduled for:  03/02/2021 at 3:00 PM    Medication Assistance: None required.  Patient affirms current coverage meets needs.  Compliance/Adherence/Medication fill history: Care Gaps: Hepatitis C Screening Tetanus Vaccine Shingrix Vaccine  Foot Exam (Last Completed 03/17/2018  Star-[facility] Drugs: Glipizide 10 mg last filled 12/19/2020 90 day supply at [facility]. Jardiance 10 mg last filled 12/07/2020 30 day supply at [place]. Losartan 50 mg last filled 09/15/2020 90 day supply at [facility]. Patient reports she filled prescription in November.  Januvia 100 mg last filled 10/30/2020 90 day supply at [facility]. Metformin 500 mg last filled 10/30/2020 90 day supply at [facility].  Patient's preferred pharmacy is:  [id number] [address], [address] AT [facility] [address] [place] [address] Phone: [phone] Fax: [phone]  [facility] ([facility]) - [place] [place] [address] Phone: [phone] Fax: [phone]   Uses pill box? Yes Pt endorses 100% compliance  We discussed: Current pharmacy is preferred with insurance plan and patient is satisfied with pharmacy services Patient decided to: Continue current medication management strategy  Care Plan and Follow Up Patient Decision:  Patient agrees to Care Plan and Follow-up.  Plan: Telephone follow up appointment with care management team member scheduled for:  03/02/2021 at 3:00 PM  Junius Argyle, PharmD, Para March, CPP Clinical Pharmacist Practitioner  [facility]  [phone]

## 2021-09-21 DIAGNOSIS — I10 Essential (primary) hypertension: Secondary | ICD-10-CM

## 2021-09-21 DIAGNOSIS — F32A Depression, unspecified: Secondary | ICD-10-CM

## 2021-09-21 DIAGNOSIS — E039 Hypothyroidism, unspecified: Secondary | ICD-10-CM

## 2021-09-21 DIAGNOSIS — E1159 Type 2 diabetes mellitus with other circulatory complications: Secondary | ICD-10-CM | POA: Diagnosis not present

## 2021-09-21 DIAGNOSIS — Z794 Long term (current) use of insulin: Secondary | ICD-10-CM

## 2021-09-21 NOTE — Patient Instructions (Signed)
Visit Information It was great speaking with you today!  Please let me know if you have any questions about our visit.   Goals Addressed             This Visit's Progress    Monitor and Manage My Blood Sugar-Diabetes Type 2   On track    Timeframe:  Long-Range Goal Priority:  High Start Date: 12/30/2020                            Expected End Date: 12/30/2021                      Follow Up within 90 days   -check blood sugar daily before breakfast - check blood sugar if I feel it is too high or too low - take the blood sugar log to all doctor visits    Why is this important?   Checking your blood sugar at home helps to keep it from getting very high or very low.  Writing the results in a diary or log helps the doctor know how to care for you.  Your blood sugar log should have the time, date and the results.  Also, write down the amount of insulin or other medicine that you take.  Other information, like what you ate, exercise done and how you were feeling, will also be helpful.     Notes:         Patient Care Plan: General Pharmacy (Adult)     Problem Identified: Hypertension, Hyperlipidemia, Diabetes, Coronary Artery Disease, GERD, Chronic Kidney Disease, Hypothyroidism, Depression, Osteoarthritis, and Peripheral Artery Disease   Priority: High     Long-Range Goal: Patient-Specific Goal   Start Date: 12/30/2020  Expected End Date: 12/30/2021  This Visit's Progress: On track  Recent Progress: On track  Priority: High  Note:   Current Barriers:  Unable to maintain control of diabetes  Pharmacist Clinical Goal(s):  Patient will achieve control of diabetes as evidenced by A1c less than 8% through collaboration with PharmD and provider.   Interventions: 1:1 collaboration with [name], MD regarding development and update of comprehensive plan of care as evidenced by provider attestation and co-signature Inter-disciplinary care team collaboration (see  longitudinal plan of care) Comprehensive medication review performed; medication list updated in electronic medical record  Hypertension (BP goal <140/90) -Controlled -Current treatment: Amlodipine 10 mg  Metoprolol tartrate 50 mg twice daily -Medications previously tried: losartan  -Current home readings: Does not monitor regularly at home  -Denies hypotensive/hypertensive symptoms -Counseled to monitor BP at home weekly, document, and provide log at future appointments -Recommended to continue current medication  Hyperlipidemia: (LDL goal < 55) -Uncontrolled -History of PAD, 80 year smoking history.  -Current treatment: Repatha 140 mg every 14 days  Vascepa 1g 2 caps twice daily -Current treatment: Aspirin 81 mg daily  Clopidogrel 75 mg daily  -Medications previously tried: Eztimibe, Aotrvastatin, Rosuvastatin, Livalo, colesevelam, cholestyramine  -Tolerating lipid regimen well, reports it is affordable. Counseled patient on importance of limiting fat in diet in addition to medication changes. -Recommended to continue current medication  Diabetes (A1c goal <8%) -Controlled -Complications: Peripheral neuropathy, nephropathy, PAD.  -Current medications: Glipizide XL 10 mg daily  Lantus 15 units twice daily  Ozempic 0.5 mg weekly  -Medications previously tried: Trulicity (itching), -Current home glucose readings fasting glucose: 88,94, 154,  -Denies hypoglycemic/hyperglycemic symptoms  Depression (Goal: maintain symptom remission) -Not ideally controlled -  Current treatment: Paroxetine 20 mg daily  -Medications previously tried/failed: NA  -PHQ9: 0 -Recommended to continue current medication  Insomnia (Goal: Improve sleep) -Controlled -Current treatment  None -Medications previously tried: NA -Recommended to continue current medication  Hypothyroidism (Goal: Maintain thyroid function) -Controlled -Current treatment  Levothyroxine 100 mcg daily  -Medications  previously tried: NA  -Recommended to continue current medication  GERD (Goal: Prevent heartburn symptoms) -Controlled -Current treatment  Pantoprazole 40 mg daily  -Medications previously tried: NA -Patient reports rebound symptoms if she skips her pantoprazole.   -Recommended to continue current medication  Chronic Pain (Goal: Minimize pain) -Controlled -Current treatment  Acetaminophen CR 650 mg every 8 hours as needed Simponi injections every 2 months  -Medications previously tried: NA -Counseled to avoid NSAID use.  -Counseled to limit acetaminophen to 3000 mg daily.   -Recommended to continue current medication  Chronic Kidney Disease Stage 4  -All medications assessed for renal dosing and appropriateness in chronic kidney disease. -Recommended to continue current medication  Patient Goals/Self-Care Activities Patient will:  - check glucose daily before breakfast, document, and provide at future appointments check blood pressure weekly, document, and provide at future appointments  Follow Up Plan: Telephone follow up appointment with care management team member scheduled for:  03/02/2021 at 3:00 PM      Patient agreed to services and verbal consent obtained.   The patient verbalized understanding of instructions, educational materials, and care plan provided today and DECLINED offer to receive copy of patient instructions, educational materials, and care plan.   Junius Argyle, PharmD, Para [place], CPP Clinical Pharmacist Practitioner  [facility]  [phone]

## 2021-09-28 LAB — HM DIABETES EYE EXAM

## 2021-10-03 ENCOUNTER — Ambulatory Visit (INDEPENDENT_AMBULATORY_CARE_PROVIDER_SITE_OTHER): Payer: Medicare Other | Admitting: Family Medicine

## 2021-10-03 ENCOUNTER — Encounter: Payer: Self-pay | Admitting: Family Medicine

## 2021-10-03 VITALS — BP 126/70 | HR 78 | Temp 96.8°F | Ht 63.0 in | Wt 141.0 lb

## 2021-10-03 DIAGNOSIS — Z794 Long term (current) use of insulin: Secondary | ICD-10-CM | POA: Diagnosis not present

## 2021-10-03 DIAGNOSIS — E039 Hypothyroidism, unspecified: Secondary | ICD-10-CM | POA: Diagnosis not present

## 2021-10-03 DIAGNOSIS — N184 Chronic kidney disease, stage 4 (severe): Secondary | ICD-10-CM | POA: Diagnosis not present

## 2021-10-03 DIAGNOSIS — S78112A Complete traumatic amputation at level between left hip and knee, initial encounter: Secondary | ICD-10-CM

## 2021-10-03 DIAGNOSIS — B029 Zoster without complications: Secondary | ICD-10-CM

## 2021-10-03 DIAGNOSIS — I739 Peripheral vascular disease, unspecified: Secondary | ICD-10-CM

## 2021-10-03 DIAGNOSIS — E1122 Type 2 diabetes mellitus with diabetic chronic kidney disease: Secondary | ICD-10-CM | POA: Diagnosis not present

## 2021-10-03 DIAGNOSIS — E785 Hyperlipidemia, unspecified: Secondary | ICD-10-CM

## 2021-10-03 DIAGNOSIS — E1142 Type 2 diabetes mellitus with diabetic polyneuropathy: Secondary | ICD-10-CM | POA: Diagnosis not present

## 2021-10-03 LAB — BASIC METABOLIC PANEL
BUN: 30 mg/dL — ABNORMAL HIGH (ref 6–23)
CO2: 26 mEq/L (ref 19–32)
Calcium: 9.9 mg/dL (ref 8.4–10.5)
Chloride: 103 mEq/L (ref 96–112)
Creatinine, Ser: 1.47 mg/dL — ABNORMAL HIGH (ref 0.40–1.20)
GFR: 33.9 mL/min — ABNORMAL LOW (ref >60.00)
Glucose, Bld: 143 mg/dL — ABNORMAL HIGH (ref 70–99)
Potassium: 5.2 mEq/L — ABNORMAL HIGH (ref 3.5–5.1)
Sodium: 138 mEq/L (ref 135–145)

## 2021-10-03 LAB — HEMOGLOBIN A1C: Hgb A1c MFr Bld: 7.4 % — ABNORMAL HIGH (ref 4.6–6.5)

## 2021-10-03 NOTE — Progress Notes (Signed)
[facility] [address] [place] [address] Dept: [phone] Dept Fax: [phone]  Chronic Care Office Visit  Subjective:    Patient ID: [name], female    DOB: [birth year], 81 y.o..   MRN: [id number]  Chief Complaint  Patient presents with   Follow-up    F/u meds. C/o having pain in lower back/radiating into RT abdomen.  (Has shingles on RT side)      History of Present Illness:  Patient is in today for reassessment of chronic medical issues.  Ms. Christianne Borrow has a history of peripheral artery disease. She underwent a left AKA in Feb. due to advanced disease and a non-healing wound on the left foot.  She now has a prosthesis. She is ambulating with either a walker or a cane in most situations. She had a repeat catheterization of the aorta and right lower extremity, with two further stent placements near the ankle. She was told her circulation improved from 16% to 60% with that procedure.   Ms. Fehr has a past history of aortic stenosis and coronary artery disease. She had CABG x 2 and aortic valve replacement in 04/2020. She also has hypertension and hyperlipidemia.  She is managed on aspirin 81 mg daily, Plavix 75 mg daily, metoprolol 50 mg bid, and amlodipine 10 mg daily. She has been intolerant of statins and is on Repatha and Vascepa.   Ms. Mickelson has a history of Type 2 diabetes. She is managed on glipizide 10 mg , semaglutide (Ozempic) 0.5 mg weekly, and insulin glargine 15 units bid. Her diabetes is complicated with peripheral neuropathy.   Ms. Strub has a history of hypothyroidism. She is managed on levothyroxine 125 mcg daily.   Ms. Sloss notes that last week, she developed a rash on her lower right abdomen. She was diagnosed with shingles and treated with a course of valacyclovir, which she completes tomorrow. She has been having some twinges of pain in this area, which she was not certain if this was  from her back or related to the shingles. She also wonders if she has a 2nd patch of rash further back. Concurrently, she has two cold sores that have shown up on her lower lip.  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   Primary insomnia 11/02/2020   Irritation of eyelid 07/26/2020   S/P aortic valve replacement with bioprosthetic valve 05/04/2020   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   Peripheral artery disease (HCC)    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[place]) 04/07/2019   Low back pain 02/03/2019   Scoliosis deformity of spine 02/03/2019   Osteoarthritis of hip 11/17/2018   Claudication ([place])    Chronic headaches 11/12/2017   Degeneration of lumbar intervertebral disc 06/13/2017   Hyperkalemia 06/03/2017   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[facility])    Mass of left adrenal gland ([place]) 04/19/2017   Spinal stenosis of lumbar region 10/20/2014   Hyperlipidemia 03/16/2013   Dense breasts 12/22/2012   Anxiety state 04/16/2012   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/16/2012  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   Osteoarthritis of knee 04/16/2012   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 female breast ([place]) 12/20/2010   History of breast cancer 12/05/2010   Depression 09/14/2008   GERD 09/14/2008   Type 2 diabetes mellitus with neurologic complication, with long-term current use of insulin ([place]) 05/20/2008   Coronary atherosclerosis 04/17/2007   Hypothyroidism 04/03/2007   Past Surgical History:   Procedure Laterality Date   ABDOMINAL AORTOGRAM W/LOWER EXTREMITY Bilateral 04/29/2019   Procedure: ABDOMINAL AORTOGRAM W/LOWER EXTREMITY;  Surgeon: Marty Heck, MD;  Location: [facility];  Service: Cardiovascular;  Laterality: Bilateral;   ABDOMINAL AORTOGRAM W/LOWER EXTREMITY Left 07/01/2019   Procedure: ABDOMINAL AORTOGRAM W/ Left LOWER EXTREMITY [place];  Surgeon: Marty Heck, MD;  Location: [facility];  Service: Cardiovascular;  Laterality: Left;   ABDOMINAL AORTOGRAM W/LOWER EXTREMITY Left 03/03/2021   Procedure: ABDOMINAL AORTOGRAM W/LOWER EXTREMITY;  Surgeon: Cherre Robins, MD;  Location: [facility];  Service: Cardiovascular;  Laterality: Left;   ABDOMINAL AORTOGRAM W/LOWER EXTREMITY Right 06/12/2021   Procedure: ABDOMINAL AORTOGRAM W/LOWER EXTREMITY;  Surgeon: Waynetta Sandy, MD;  Location: [facility];  Service: Cardiovascular;  Laterality: Right;  LOWER LEG   AMPUTATION Left 03/06/2021   Procedure: LEFT ABOVE KNEE AMPUTATION;  Surgeon: [name], MD;  Location: [facility];  Service: Vascular;  Laterality: Left;   ANTERIOR AND POSTERIOR REPAIR N/A 04/05/2016   Procedure: ANTERIOR (CYSTOCELE) AND POSTERIOR REPAIR (RECTOCELE);  Surgeon: [name], MD;  Location: [facility];  Service: Gynecology;  Laterality: N/A;   AORTIC VALVE REPLACEMENT N/A 05/04/2020   Procedure: AORTIC VALVE REPLACEMENT (AVR) USING INSPIRIS 23MM RESILIA AORTIC VALVE;  Surgeon: Rexene Alberts, MD;  Location: [facility];  Service: Open Heart Surgery;  Laterality: N/A;   BREAST BIOPSY     BREAST LUMPECTOMY  1983   benign biopsy   BREAST LUMPECTOMY Right 12/29/2010   snbx, ER/PR +, Her2 -, 0/1 node pos.   CARPAL TUNNEL RELEASE Bilateral 9/12,8,12   CHOLECYSTECTOMY     COLONOSCOPY N/A 02/09/2013   Procedure: COLONOSCOPY;  Surgeon: [name], MD;  [facility]: [facility];  Service: Endoscopy;  Laterality: N/A;   COLONOSCOPY     CORONARY ANGIOPLASTY     CORONARY  ARTERY BYPASS GRAFT N/A 05/04/2020   Procedure: CORONARY ARTERY BYPASS GRAFTING (CABG), ON PUMP, TIMES TWO, USING LEFT INTERNAL MAMMARY ARTERY AND RIGHT ENDOSCOPICALLY HARVESTED GREATER SAPHENOUS VEIN;  Surgeon: Rexene Alberts, MD;  Location: [facility];  Service: Open Heart Surgery;  Laterality: N/A;   DILATION AND CURETTAGE OF UTERUS     ESOPHAGOGASTRODUODENOSCOPY N/A 02/09/2013   Procedure: ESOPHAGOGASTRODUODENOSCOPY (EGD);  Surgeon: [name], MD;  Location: [name];  Service: Endoscopy;  Laterality: N/A;   FOOT SPURS     HYSTEROSCOPY WITH D & C N/A 09/11/2012   Procedure: DILATATION AND CURETTAGE ;  Surgeon: [name], MD;  Location: [facility];  Service: Gynecology;  Laterality: N/A;   KNEE SURGERY  2007   LAPAROSCOPIC ASSISTED VAGINAL HYSTERECTOMY N/A 04/05/2016   Procedure: LAPAROSCOPIC ASSISTED VAGINAL HYSTERECTOMY possible BSO;  Surgeon: [name], MD;  Location: [facility];  Service: Gynecology;  Laterality: N/A;   LUMBAR LAMINECTOMY/DECOMPRESSION MICRODISCECTOMY N/A 10/20/2014   Procedure: MICRO LUMBER DECOMPRESSION L3-4 L4-5;  Surgeon: [name], MD;  [facility]: [facility];  Service: Orthopedics;  Laterality: N/A;   LUMBAR LAMINECTOMY/DECOMPRESSION MICRODISCECTOMY Bilateral 10/13/2015   Procedure: MICRO LUMBAR DECOMPRESSION L5 - S1  AND REDO DECOMPRESSION L4 - L5 AND REMOVAL OF FACET CYST L4 - L5 2 LEVELS;  Surgeon: [name], MD;  Location: [facility];  Service: Orthopedics;  Laterality: Bilateral;   PERIPHERAL VASCULAR ATHERECTOMY Right 06/12/2021   Procedure: PERIPHERAL VASCULAR ATHERECTOMY;  Surgeon: Waynetta Sandy, MD;  Location: [facility];  Service: Cardiovascular;  Laterality: Right;  SFA/POP/AT   PERIPHERAL VASCULAR INTERVENTION Right 04/29/2019   Procedure: PERIPHERAL VASCULAR INTERVENTION;  Surgeon: Marty Heck, MD;  Location: [facility];  Service: Cardiovascular;  Laterality: Right;   PERIPHERAL VASCULAR INTERVENTION Right 06/12/2021    Procedure: PERIPHERAL VASCULAR INTERVENTION;  Surgeon: Waynetta Sandy, MD;  Location: [facility];  Service: Cardiovascular;  Laterality: Right;  SFP/POPLITEAL/AT   PORT-A-CATH REMOVAL  04/17/2011   Procedure: REMOVAL PORT-A-CATH;  Surgeon: [name], MD;  Location: [facility];  Service: General;  Laterality: Left;   PORTACATH PLACEMENT  02/07/2011   Procedure: INSERTION PORT-A-CATH;  Surgeon: [name], MD;  [facility];  Service: General;  Laterality: N/A;   RIGHT/LEFT HEART CATH AND CORONARY ANGIOGRAPHY N/A 03/17/2020   Procedure: RIGHT/LEFT HEART CATH AND CORONARY ANGIOGRAPHY;  Surgeon: Burnell Blanks, MD;  Location: [facility];  Service: Cardiovascular;  Laterality: N/A;   TEE WITHOUT CARDIOVERSION N/A 05/04/2020   Procedure: TRANSESOPHAGEAL ECHOCARDIOGRAM (TEE);  Surgeon: Rexene Alberts, MD;  Location: [facility];  Service: Open Heart Surgery;  Laterality: N/A;   UPPER GASTROINTESTINAL ENDOSCOPY     Family History  Problem Relation Age of Onset   Kidney disease Mother    Heart disease Mother    Throat cancer Father    Alcoholism Father    Heart attack Father    Lymphoma Brother 33   Heart attack Brother    Diabetes Brother    Hypertension Brother    Cancer Son    Colon cancer Neg Hx    Stroke Neg Hx    Esophageal cancer Neg Hx    Rectal cancer Neg Hx    Stomach cancer Neg Hx    Outpatient Medications Prior to Visit  Medication Sig Dispense Refill   Accu-Chek FastClix Lancets MISC USE TO CHECK BLOOD SUGARS 3 TIMES DAILY 306 each 3   amLODipine (NORVASC) 10 MG tablet Take 10 mg by mouth daily.     ascorbic acid (VITAMIN C) 250 MG tablet Take 1 tablet (250 mg total) by mouth 2 (two) times daily. 60 tablet 0   aspirin EC 81 MG tablet Take 81 mg by mouth in the morning.     blood glucose meter kit and supplies Dispense based on patient and insurance preference. Use up to four times daily as directed. (FOR ICD-10  E10.9, E11.9). 1 each 0   Cholecalciferol (D3 5000 PO) Take by mouth daily.     clopidogrel (PLAVIX) 75 MG tablet Take 1 tablet (75 mg total) by mouth daily. 30 tablet 11   Evolocumab (REPATHA SURECLICK) 956 MG/ML SOAJ Inject 1 pen into the skin every 14 (fourteen) days. (Patient taking differently: Inject 140 mg into the skin every 14 (fourteen) days.) 2 mL 11   glipiZIDE (GLUCOTROL XL) 10 MG 24 hr tablet Take 1 tablet (10 mg total) by mouth daily with breakfast. 90 tablet 3   glucose blood (ACCU-CHEK GUIDE) test strip TEST ONCE DAILY 100 each 5   Golimumab (SIMPONI ARIA IV) Inject into the vein every 2 (two) months.     icosapent Ethyl (VASCEPA) 1 g capsule  Take 2 capsules (2 g total) by mouth 2 (two) times daily. 120 capsule 11   insulin glargine (LANTUS) 100 UNIT/ML Solostar Pen Inject 15 Units into the skin 2 (two) times daily. 15 mL 3   Insulin Pen Needle (CAREFINE PEN NEEDLES) 32G X 6 MM MISC 1 application by Does not apply route 2 (two) times daily. 120 each 1   levothyroxine (SYNTHROID) 125 MCG tablet Take 125 mcg by mouth daily before breakfast.     metoprolol tartrate (LOPRESSOR) 50 MG tablet Take 1 tablet (50 mg total) by mouth 2 (two) times daily. 30 tablet 0   Multiple Vitamin (MULTIVITAMIN) tablet Take 1 tablet by mouth daily.     pantoprazole (PROTONIX) 40 MG tablet Take 1 tablet (40 mg total) by mouth daily before breakfast.     PARoxetine (PAXIL) 20 MG tablet Take 1 tablet (20 mg total) by mouth in the morning. 90 tablet 3   Semaglutide,0.25 or 0.5MG/DOS, 2 MG/1.5ML SOPN Inject 0.5 mg into the skin every Tuesday. 3 mL 6   valACYclovir (VALTREX) 1000 MG tablet Take 1 tablet (1,000 mg total) by mouth daily as needed (Fever Blisters). 180 tablet 0   clopidogrel (PLAVIX) 75 MG tablet 1 tablet Orally Once a day     No facility-administered medications prior to visit.   Allergies  Allergen Reactions   Codeine Nausea And Vomiting and Other (See Comments)    Severe stomach cramps,  also Other reaction(s): Unknown   Antihistamines, Diphenhydramine-Type Other (See Comments)    Causes hyperactivity   Diphenhydramine     Other reaction(s): Unknown   Folic Acid Other (See Comments)    Other reaction(s): tongue pain   Gabapentin     Urinary incontinence   Lyrica [Pregabalin]     Double vision/falls   Methotrexate Other (See Comments)    Other reaction(s): oral ulcers   Oxycodone     Hallucinations/crazy feelings   Rheumate [Fish Oil]     Other reaction(s): hypoglycemia   Statins Other (See Comments)    Joint pains Other reaction(s): Unknown   Sulfa Antibiotics Other (See Comments)    Reaction not recalled   Erythromycin Hives, Rash and Other (See Comments)    Allergic due to dental work from 50 years ago. It was used in packing and resulted in rash/hives inside and outside of mouth.   Trulicity [Dulaglutide] Itching      Objective:   Today's Vitals   10/03/21 0847  BP: 126/70  Pulse: 78  Temp: (!) 96.8 F (36 C)  TempSrc: Temporal  SpO2: 96%  Weight: 141 lb (64 kg)  Height: _0  (1.6 m)   Body mass index is 24.98 kg/m.   General: Well developed, well nourished. No acute distress. HEENT: Two small crusted lesions on the lower lip at the vermilion border. Extremities: Right leg shows no swelling. The right foot is warm and skin intact. Skin: Warm and dry. There are two clusters of crusting vesicles on the lwoer abdomen and lower back on the   right. There is mild erythema in this area. Psych: Alert and oriented. Normal mood and affect.  Health Maintenance Due  Topic Date Due   Hepatitis C Screening  Never done   TETANUS/TDAP  Never done   Zoster Vaccines- Shingrix (2 of 2) 03/29/2011   INFLUENZA VACCINE  08/22/2021   OPHTHALMOLOGY EXAM  09/23/2021   URINE MICROALBUMIN  11/02/2021     Lab Results:  Lab Results  Component Value Date   TSH  1.08 07/03/2021   Lab Results  Component Value Date   CHOL 104 03/04/2021   HDL 38 (L) 03/04/2021    LDLCALC 35 03/04/2021   LDLDIRECT 172.0 11/02/2020   TRIG 154 (H) 03/04/2021   CHOLHDL 2.7 03/04/2021   Lab Results  Component Value Date   HGBA1C 7.0 (H) 07/03/2021   Assessment & Plan:   1. Type 2 diabetes mellitus with diabetic polyneuropathy, with long-term current use of insulin ([place]) Due for repeat A1c. Plan to continue glipizide 10 mg , semaglutide (Ozempic) 0.5 mg weekly, and insulin glargine 15 units bid.  - Hemoglobin U8X - Basic metabolic panel  2. Diabetic peripheral neuropathy (H[facility]) Stable. Right foot appears to be in good shape.  3. 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[place]) We will repeat her renal labs today. Blood pressure in good control.  - Basic metabolic panel  4. Acquired hypothyroidism TSH has been at goal. Continue levothyroxine 125 mcg daily.  5. Peripheral artery disease ([place]) Improved with last stenting. Continue lipid management.  6. 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 Ms. Adcox is ambulating better and appears to have fully recovered from this.  7. Hyperlipidemia, unspecified hyperlipidemia type Lipids are at goal on Repatha and Vascepa.  8. Herpes zoster without complication Rash is consistent with resolving herpes zoster. We discussed the nature of pain associated with zoster. At this point, she can use Tylenol. If pain is persistent over the next 2 weeks, she should reach out and we would try a low dose gabapentin. She previously was vaccinated with Zostavax. I recommend she consider taking Shingrix in 1 year.   Return in about 3 months (around 01/02/2022) for Reassessment.   [name], MD

## 2021-10-04 ENCOUNTER — Other Ambulatory Visit: Payer: Self-pay

## 2021-10-04 DIAGNOSIS — E1140 Type 2 diabetes mellitus with diabetic neuropathy, unspecified: Secondary | ICD-10-CM

## 2021-10-04 MED ORDER — GLIPIZIDE ER 10 MG PO TB24: 10.0000 mg | ORAL_TABLET | Freq: Every day | ORAL | 3 refills | Status: DC

## 2021-10-12 ENCOUNTER — Other Ambulatory Visit: Payer: Self-pay | Admitting: Family Medicine

## 2021-10-17 ENCOUNTER — Encounter: Payer: Self-pay | Admitting: Family Medicine

## 2021-11-18 ENCOUNTER — Other Ambulatory Visit: Payer: Self-pay | Admitting: Cardiovascular Disease

## 2021-11-26 ENCOUNTER — Other Ambulatory Visit: Payer: Self-pay | Admitting: Cardiovascular Disease

## 2021-12-07 ENCOUNTER — Ambulatory Visit (INDEPENDENT_AMBULATORY_CARE_PROVIDER_SITE_OTHER): Payer: Medicare Other

## 2021-12-07 DIAGNOSIS — Z794 Long term (current) use of insulin: Secondary | ICD-10-CM

## 2021-12-07 DIAGNOSIS — E785 Hyperlipidemia, unspecified: Secondary | ICD-10-CM

## 2021-12-07 DIAGNOSIS — I739 Peripheral vascular disease, unspecified: Secondary | ICD-10-CM

## 2021-12-07 NOTE — Progress Notes (Signed)
Chronic Care Management Pharmacy Note  12/21/2021 Name:  [name] MRN:  [id number] DOB:  04/24/1940  Summary: Patient presents for CCM follow-up.   -Blood Sugars mostly controlled with some day-to-day elevations related to eating habits. Not able to exercise since her amputation. Hopes to be able to get more activity in 2024.   -Patient potential candidate for SGLT-2, would not start in this patient given recent amputation.  Patient interested in resuming PT in 2024. Will reach out to PCP to request new referral once her insurance covers the program.   Recommendations/Changes made from today's visit: Continue current medications  Plan: CPP follow-up 6 months.   Subjective: [name] is an 81 y.o. year old female who is a primary patient of Rudd, Lillette Boxer, MD.  The CCM team was consulted for assistance with disease management and care coordination needs.    Engaged with patient by telephone for follow up visit in response to provider referral for pharmacy case management and/or care coordination services.   Consent to Services:  The patient was given information about Chronic Care Management services, agreed to services, and gave verbal consent prior to initiation of services.  Please see initial visit note for detailed documentation.   Patient Care Team: [name], MD as PCP - General (Family Medicine) Josue Hector, MD as PCP - Cardiology (Cardiology) Josue Hector, MD as Consulting Physician (Cardiology) Waynetta Sandy, MD as Consulting Physician (Vascular Surgery) Germaine Pomfret, [facility] as Pharmacist (Pharmacist) Gavin Pound, MD as Consulting Physician (Rheumatology)  Recent office visits: 10/03/21: Patient presented to Dr. Gena Fray for follow-up.  07/03/21: Patient presented to Dr. Gena Fray for follow-up.   Recent consult visits: 06/27/21: Patient presented to Karoline Caldwell, PA-C (Vascular) for follow-up.   Hospital visits: 06/12/21:  Patient presented for abdominal aortogram    Objective:  Lab Results  Component Value Date   CREATININE 1.47 (H) 10/03/2021   BUN 30 (H) 10/03/2021   GFR 33.90 (L) 10/03/2021   GFRNONAA 35 (L) 03/16/2021   GFRAA 32 (L) 03/11/2020   NA 138 10/03/2021   K 5.2 No hemolysis seen (H) 10/03/2021   CALCIUM 9.9 10/03/2021   CO2 26 10/03/2021   GLUCOSE 143 (H) 10/03/2021    Lab Results  Component Value Date/Time   HGBA1C 7.4 (H) 10/03/2021 09:30 AM   HGBA1C 7.0 (H) 07/03/2021 09:50 AM   HGBA1C 10.0 05/22/2018 01:59 PM   HGBA1C 8.4 03/17/2018 11:15 AM   GFR 33.90 (L) 10/03/2021 09:30 AM   GFR 28.59 (L) 02/21/2021 12:09 PM   MICROALBUR 33.6 (H) 11/02/2020 09:40 AM   MICROALBUR 25.9 (H) 11/20/2019 10:02 AM   MICROALBUR 150 06/03/2017 08:28 AM    Last diabetic Eye exam:  Lab Results  Component Value Date/Time   HMDIABEYEEXA No Retinopathy 09/28/2021 12:00 AM    Last diabetic Foot exam: No results found for: "HMDIABFOOTEX"   Lab Results  Component Value Date   CHOL 104 03/04/2021   HDL 38 (L) 03/04/2021   LDLCALC 35 03/04/2021   LDLDIRECT 172.0 11/02/2020   TRIG 154 (H) 03/04/2021   CHOLHDL 2.7 03/04/2021       Latest Ref Rng & Units 03/14/2021    5:07 AM 03/12/2021    1:32 AM 03/09/2021    2:55 AM  Hepatic Function  Total Protein 6.5 - 8.1 g/dL 6.8     Albumin 3.5 - 5.0 g/dL 2.9  2.7  2.8   AST 15 - 41 U/L 21  ALT 0 - 44 U/L 15     Alk Phosphatase 38 - 126 U/L 65     Total Bilirubin 0.3 - 1.2 mg/dL 0.3       Lab Results  Component Value Date/Time   TSH 1.08 07/03/2021 09:50 AM   TSH 5.599 (H) 03/01/2021 04:23 AM   TSH 5.476 (H) 03/01/2021 01:28 AM   TSH 18.50 (H) 02/02/2021 08:26 AM   FREET4 1.14 06/23/2019 08:06 AM   FREET4 0.97 05/06/2019 08:59 AM       Latest Ref Rng & Units 06/12/2021    8:45 AM 03/14/2021    5:07 AM 03/12/2021    1:32 AM  CBC  WBC 4.0 - 10.5 K/uL  11.7  9.4   Hemoglobin 12.0 - 15.0 g/dL 12.9  10.9  10.0   Hematocrit 36.0 - 46.0 %  38.0  32.9  31.6   Platelets 150 - 400 K/uL  428  373     Lab Results  Component Value Date/Time   VD25OH 49.46 11/02/2020 09:40 AM   VD25OH 55.05 11/03/2018 09:16 AM    Clinical ASCVD: No  The ASCVD Risk score (Arnett DK, et al., 2019) failed to calculate for the following reasons:   The patient has a prior MI or stroke diagnosis       10/03/2021    9:32 AM 09/12/2021   11:07 AM 09/12/2021   11:03 AM  Depression screen PHQ 2/9  Decreased Interest 0 0 0  Down, Depressed, Hopeless 0 0 0  PHQ - 2 Score 0 0 0  Altered sleeping 0    Tired, decreased energy 0    Change in appetite 0    Feeling bad or failure about yourself  0    Trouble concentrating 0    Moving slowly or fidgety/restless 0    Suicidal thoughts 0    PHQ-9 Score 0    Difficult doing work/chores Not difficult at all      Social History   Tobacco Use  Smoking Status Former   Packs/day: 2.00   Years: 40.00   Total pack years: 80.00   Types: Cigarettes   Quit date: 12/19/1997   Years since quitting: 24.0  Smokeless Tobacco Never   BP Readings from Last 3 Encounters:  10/03/21 126/70  08/04/21 128/71  07/28/21 (!) 122/50   Pulse Readings from Last 3 Encounters:  10/03/21 78  08/04/21 80  07/28/21 81   Wt Readings from Last 3 Encounters:  10/03/21 141 lb (64 kg)  08/04/21 140 lb (63.5 kg)  07/28/21 144 lb (65.3 kg)   BMI Readings from Last 3 Encounters:  10/03/21 24.98 kg/m  08/04/21 24.80 kg/m  07/28/21 25.51 kg/m    Assessment/Interventions: Review of patient past medical history, allergies, medications, health status, including review of consultants reports, laboratory and other test data, was performed as part of comprehensive evaluation and provision of chronic care management services.   SDOH:  (Social Determinants of Health) assessments and interventions performed: Yes SDOH Interventions    Flowsheet Row Clinical Support from 09/12/2021 in [place]  Management from 12/29/2020 in [place] from 09/06/2020 in [place] from 08/19/2019 in [place] Interventions Intervention Not Indicated -- Intervention Not Indicated --  Housing Interventions Intervention Not Indicated -- Intervention Not Indicated --  Transportation Interventions Intervention Not Indicated -- Intervention Not Indicated --  Financial Strain Interventions Intervention Not Indicated Intervention Not Indicated Intervention Not Indicated --  Physical Activity Interventions Intervention Not Indicated -- Intervention Not Indicated Other (Comments)  [patient has set a goal to start walking]  Stress Interventions Intervention Not Indicated -- Intervention Not Indicated --  Social Connections Interventions Intervention Not Indicated -- Intervention Not Indicated --       SDOH Screenings   Food Insecurity: No Food Insecurity (09/12/2021)  Housing: Low Risk  (09/12/2021)  Transportation Needs: No Transportation Needs (09/12/2021)  Alcohol Screen: Low Risk  (09/12/2021)  Depression (PHQ2-9): Low Risk  (10/03/2021)  Financial Resource Strain: Low Risk  (09/12/2021)  Physical Activity: Insufficiently Active (09/12/2021)  Social Connections: Moderately Isolated (09/12/2021)  Stress: No Stress Concern Present (09/12/2021)  Tobacco Use: Medium Risk (10/03/2021)    CCM Care Plan  Allergies  Allergen Reactions   Codeine Nausea And Vomiting and Other (See Comments)    Severe stomach cramps, also Other reaction(s): Unknown   Antihistamines, Diphenhydramine-Type Other (See Comments)    Causes hyperactivity   Diphenhydramine     Other reaction(s): Unknown   Folic Acid Other (See Comments)    Other reaction(s): tongue pain   Gabapentin     Urinary incontinence   Lyrica [Pregabalin]     Double vision/falls   Methotrexate Other (See Comments)     Other reaction(s): oral ulcers   Oxycodone     Hallucinations/crazy feelings   Rheumate [Fish Oil]     Other reaction(s): hypoglycemia   Statins Other (See Comments)    Joint pains Other reaction(s): Unknown   Sulfa Antibiotics Other (See Comments)    Reaction not recalled   Erythromycin Hives, Rash and Other (See Comments)    Allergic due to dental work from 50 years ago. It was used in packing and resulted in rash/hives inside and outside of mouth.   Trulicity [Dulaglutide] Itching    Medications Reviewed Today     Reviewed by [name], MD (Physician) on 10/03/21 at [place] List Status: <None>   Medication Order Taking? Sig Documenting Provider Last Dose Status Informant  Accu-Chek FastClix Lancets MISC [id number] Yes USE TO CHECK BLOOD SUGARS 3 TIMES DAILY Cirigliano, Garvin Fila, DO Taking Active Self  amLODipine (NORVASC) 10 MG tablet [id number] Yes Take 10 mg by mouth daily. [provider] Taking Active   ascorbic acid (VITAMIN C) 250 MG tablet [id number] Yes Take 1 tablet (250 mg total) by mouth 2 (two) times daily. Bary Leriche, PA-C Taking Active Self  aspirin EC 81 MG tablet [id number] Yes Take 81 mg by mouth in the morning. [provider] Taking Active Self  blood glucose meter kit and supplies [id number] Yes Dispense based on patient and insurance preference. Use up to four times daily as directed. (FOR ICD-10 E10.9, E11.9). [name], MD Taking Active Self  Cholecalciferol (D3 5000 PO) [id number] Yes Take by mouth daily. [provider] Taking Active   clopidogrel (PLAVIX) 75 MG tablet [id number] Yes Take 1 tablet (75 mg total) by mouth daily. Waynetta Sandy, MD Taking Active   Discontinued 10/03/21 0858   Evolocumab (REPATHA SURECLICK) 876 MG/ML SOAJ [id number] Yes Inject 1 pen into the skin every 14 (fourteen) days.  Patient taking differently: Inject 140 mg into the skin every 14 (fourteen) days.   Josue Hector, MD Taking  Active Self  glipiZIDE (GLUCOTROL XL) 10 MG 24 hr tablet [id number] Yes Take 1 tablet (10 mg total) by mouth daily with breakfast. Gena Fray,  Lillette Boxer, MD Taking Active Self  glucose blood (ACCU-CHEK GUIDE) test strip [id number] Yes TEST ONCE DAILY [name], MD Taking Active Self  Golimumab ([place] IV) [id number] Yes Inject into the vein every 2 (two) months. [provider] Taking Active Self  icosapent Ethyl (VASCEPA) 1 g capsule [id number] Yes Take 2 capsules (2 g total) by mouth 2 (two) times daily. Josue Hector, MD Taking Active Self  insulin glargine (LANTUS) 100 UNIT/ML Solostar Pen [id number] Yes Inject 15 Units into the skin 2 (two) times daily. [name], MD Taking Active Self  Insulin Pen Needle (CAREFINE PEN NEEDLES) 32G X 6 MM MISC [id number] Yes 1 application by Does not apply route 2 (two) times daily. Bary Leriche, PA-C Taking Active Self  levothyroxine (SYNTHROID) 125 MCG tablet [id number] Yes Take 125 mcg by mouth daily before breakfast. [provider] Taking Active Self  metoprolol tartrate (LOPRESSOR) 50 MG tablet [id number] Yes Take 1 tablet (50 mg total) by mouth 2 (two) times daily. Josue Hector, MD Taking Active   Multiple Vitamin (MULTIVITAMIN) tablet [id number] Yes Take 1 tablet by mouth daily. [provider] Taking Active   pantoprazole (PROTONIX) 40 MG tablet [id number] Yes Take 1 tablet (40 mg total) by mouth daily before breakfast. Aline August, MD Taking Active Self  PARoxetine (PAXIL) 20 MG tablet [id number] Yes Take 1 tablet (20 mg total) by mouth in the morning. [name], MD Taking Active Self  Semaglutide,0.25 or 0.5MG/DOS, 2 MG/1.5ML Bonney Aid [id number] Yes Inject 0.5 mg into the skin every Tuesday. [name], MD Taking Active   valACYclovir (VALTREX) 1000 MG tablet [id number] Yes Take 1 tablet (1,000 mg total) by mouth daily as needed (Fever Blisters). 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   Primary insomnia 11/02/2020   Irritation of eyelid 07/26/2020   S/P aortic valve replacement with bioprosthetic valve 05/04/2020   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   Peripheral artery disease (H[facility])    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[place]) 04/07/2019   Low back pain 02/03/2019   Scoliosis deformity of spine 02/03/2019   Osteoarthritis of hip 11/17/2018   Claudication ([place])    Chronic headaches 11/12/2017   Degeneration of lumbar intervertebral disc 06/13/2017   Hyperkalemia 06/03/2017   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[place])    Mass of left adrenal gland ([place]) 04/19/2017   Spinal stenosis of lumbar region 10/20/2014   Hyperlipidemia 03/16/2013   Dense breasts 12/22/2012   Anxiety state 04/16/2012   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/16/2012   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   Osteoarthritis of knee 04/16/2012   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 female breast ([place]) 12/20/2010   History of breast cancer 12/05/2010   Depression 09/14/2008   GERD 09/14/2008   Type 2  diabetes mellitus with neurologic complication, with long-term current use of insulin ([place]) 05/20/2008   Coronary atherosclerosis 04/17/2007   Hypothyroidism 04/03/2007    Immunization History  Administered Date(s) Administered   Fluad Quad(high Dose 65+) 11/20/2018   Influenza Split 12/07/2014   Influenza, High Dose Seasonal PF  11/26/2016   Influenza,inj,quad, With Preservative 02/22/2017, 11/20/2018   Influenza-Unspecified 10/31/2009, 12/01/2010, 12/03/2011, 11/05/2012, 11/05/2013, 10/25/2014, 11/27/2017, 10/07/2019, 11/28/2020   PFIZER(Purple Top)SARS-COV-2 Vaccination 02/12/2019, 03/05/2019, 10/24/2019   Pneumococcal Conjugate-13 07/30/2014   Pneumococcal Polysaccharide-23 11/19/2007, 11/15/2008   Pneumococcal-Unspecified 01/23/2010   Zoster Recombinat (Shingrix) 02/01/2011    Conditions to be addressed/monitored:  Hypertension, Hyperlipidemia, Diabetes, Coronary Artery Disease, GERD, Chronic Kidney Disease, Hypothyroidism, Depression, Osteoarthritis, and Peripheral Artery Disease  Care Plan : General Pharmacy (Adult)  Updates made by Germaine Pomfret, RPH since 12/21/2021 12:00 AM     Problem: Hypertension, Hyperlipidemia, Diabetes, Coronary Artery Disease, GERD, Chronic Kidney Disease, Hypothyroidism, Depression, Osteoarthritis, and Peripheral Artery Disease   Priority: High     Long-Range Goal: Patient-Specific Goal   Start Date: 12/30/2020  Expected End Date: 12/22/2022  This Visit's Progress: On track  Recent Progress: On track  Priority: High  Note:   Current Barriers:  Unable to maintain control of diabetes  Pharmacist Clinical Goal(s):  Patient will achieve control of diabetes as evidenced by A1c less than 8% through collaboration with PharmD and provider.   Interventions: 1:1 collaboration with [name], MD regarding development and update of comprehensive plan of care as evidenced by provider attestation and co-signature Inter-disciplinary care team collaboration (see longitudinal plan of care) Comprehensive medication review performed; medication list updated in electronic medical record  Hypertension (BP goal <140/90) -Controlled: Not addressed during this visit. -Current treatment: Amlodipine 10 mg daily: Appropriate, Effective, Safe, Accessible  Metoprolol tartrate 50 mg  twice daily: Appropriate, Effective, Safe, Accessible  -Medications previously tried: losartan  -Current home readings: Does not monitor regularly at home  -Denies hypotensive/hypertensive symptoms -Counseled to monitor BP at home weekly, document, and provide log at future appointments -Recommended to continue current medication  Hyperlipidemia: (LDL goal < 55) -Controlled -History of PAD, 80 year smoking history.  -Current treatment: Repatha 140 mg every 14 days: Appropriate, Effective, Safe, Accessible   Vascepa 1g 2 caps twice daily: Appropriate, Effective, Safe, Accessible  -Current treatment: Aspirin 81 mg daily: Appropriate, Effective, Safe, Accessible   Clopidogrel 75 mg daily: Appropriate, Effective, Safe, Accessible   -Medications previously tried: Eztimibe, Atorvastatin, Rosuvastatin, Livalo, colesevelam, cholestyramine  Patient interested in resuming PT in 2024. Will reach out to PCP to request new referral once her insurance covers the program.  -Recommended to continue current medication  Diabetes (A1c goal <8%) -Controlled -Complications: Peripheral neuropathy, nephropathy, PAD.  -Current medications: Glipizide XL 10 mg daily: Query Appropriate, Effective, Safe, Accessible Lantus 15 units twice daily: Appropriate, Effective, Safe, Accessible  Ozempic 0.5 mg weekly: Appropriate, Effective, Safe, Accessible  -Medications previously tried: Trulicity (itching), -Current home glucose readings fasting glucose: 187, 157, 80,  -Current meal patterns: Not cooking at home. Baked meats. Drinks mainly water.  -Current exercise: Not able to exercise since her amputation. Hopes to be able to get more activity and restart with PT.   -Patient potential candidate for SGLT-2, would not start in this patient given recent amputation.  -Denies hypoglycemic/hyperglycemic symptoms  Depression (Goal: maintain symptom remission) -Controlled: Not addressed during this visit. -Current  treatment: Paroxetine 20 mg daily  -Medications previously tried/failed: NA  -PHQ9: 0 -Recommended to continue current medication  Insomnia (Goal: Improve sleep) -Controlled -Current treatment  None -Medications previously tried: NA -Recommended to continue current medication  Hypothyroidism (Goal: Maintain thyroid function) -Controlled: Not addressed during this visit. -Current treatment  Levothyroxine 125 mcg daily  -Medications previously tried: NA  -Recommended to continue current medication  GERD (Goal: Prevent heartburn symptoms) -Controlled: Not addressed during this visit. -Current treatment  Pantoprazole 40 mg daily  -Medications previously tried: NA -Patient reports rebound symptoms if she skips her pantoprazole.   -Recommended to continue current medication  Chronic Pain (Goal: Minimize pain) -Controlled: Not addressed during this visit. -Current treatment  Acetaminophen CR 650 mg every 8 hours as needed Simponi injections every 2 months  -Medications previously tried: NA -Counseled to avoid NSAID use.  -Counseled to limit acetaminophen to 3000 mg daily.   -Recommended to continue current medication  Chronic Kidney Disease Stage 3b  -All medications assessed for renal dosing and appropriateness in chronic kidney disease. -Recommended to continue current medication  Patient Goals/Self-Care Activities Patient will:  - check glucose daily before breakfast, document, and provide at future appointments check blood pressure weekly, document, and provide at future appointments  Follow Up Plan: Telephone follow up appointment with care management team member scheduled for:  06/07/2022 at 11:00 AM     Medication Assistance: None required.  Patient affirms current coverage meets needs.  Compliance/Adherence/Medication fill history: Care Gaps: Hepatitis C Screening Tetanus Vaccine Shingrix Vaccine  Foot Exam (Last Completed 03/17/2018  Star-Rating  Drugs: Glipizide 10 mg last filled 10/05/2021 90 day supply at [facility]. Ozempic 0.5 mg last filled 11/23/2021 90 day supply at [facility].  Patient's preferred pharmacy is:  [id number] [address], [address] AT [facility] OF [address] [place] [address] Phone: [phone] Fax: [place], [place] [address] [place] [address] Phone: [phone] Fax: [phone]  Uses pill box? Yes Pt endorses 100% compliance  We discussed: Current pharmacy is preferred with insurance plan and patient is satisfied with pharmacy services Patient decided to: Continue current medication management strategy  Care Plan and Follow Up Patient Decision:  Patient agrees to Care Plan and Follow-up.  Plan: Telephone follow up appointment with care management team member scheduled for:  06/07/2022 at 11:00 AM  Junius Argyle, PharmD, Para March, CPP Clinical Pharmacist Practitioner  [facility] at [facility]  [phone]

## 2021-12-14 ENCOUNTER — Telehealth: Payer: Medicare Other

## 2021-12-21 DIAGNOSIS — Z794 Long term (current) use of insulin: Secondary | ICD-10-CM

## 2021-12-21 DIAGNOSIS — E785 Hyperlipidemia, unspecified: Secondary | ICD-10-CM

## 2021-12-21 DIAGNOSIS — E1142 Type 2 diabetes mellitus with diabetic polyneuropathy: Secondary | ICD-10-CM

## 2021-12-21 NOTE — Patient Instructions (Signed)
Visit Information It was great speaking with you today!  Please let me know if you have any questions about our visit.   Goals Addressed             This Visit's Progress    Monitor and Manage My Blood Sugar-Diabetes Type 2   On track    Timeframe:  Long-Range Goal Priority:  High Start Date: 12/30/2020                            Expected End Date: 12/30/2021                      Follow Up within 90 days   -check blood sugar daily before breakfast - check blood sugar if I feel it is too high or too low - take the blood sugar log to all doctor visits    Why is this important?   Checking your blood sugar at home helps to keep it from getting very high or very low.  Writing the results in a diary or log helps the doctor know how to care for you.  Your blood sugar log should have the time, date and the results.  Also, write down the amount of insulin or other medicine that you take.  Other information, like what you ate, exercise done and how you were feeling, will also be helpful.     Notes:         Patient Care Plan: General Pharmacy (Adult)     Problem Identified: Hypertension, Hyperlipidemia, Diabetes, Coronary Artery Disease, GERD, Chronic Kidney Disease, Hypothyroidism, Depression, Osteoarthritis, and Peripheral Artery Disease   Priority: High     Long-Range Goal: Patient-Specific Goal   Start Date: 12/30/2020  Expected End Date: 12/22/2022  This Visit's Progress: On track  Recent Progress: On track  Priority: High  Note:   Current Barriers:  Unable to maintain control of diabetes  Pharmacist Clinical Goal(s):  Patient will achieve control of diabetes as evidenced by A1c less than 8% through collaboration with PharmD and provider.   Interventions: 1:1 collaboration with [name], MD regarding development and update of comprehensive plan of care as evidenced by provider attestation and co-signature Inter-disciplinary care team collaboration (see  longitudinal plan of care) Comprehensive medication review performed; medication list updated in electronic medical record  Hypertension (BP goal <140/90) -Controlled: Not addressed during this visit. -Current treatment: Amlodipine 10 mg daily: Appropriate, Effective, Safe, Accessible  Metoprolol tartrate 50 mg twice daily: Appropriate, Effective, Safe, Accessible  -Medications previously tried: losartan  -Current home readings: Does not monitor regularly at home  -Denies hypotensive/hypertensive symptoms -Counseled to monitor BP at home weekly, document, and provide log at future appointments -Recommended to continue current medication  Hyperlipidemia: (LDL goal < 55) -Controlled -History of PAD, 80 year smoking history.  -Current treatment: Repatha 140 mg every 14 days: Appropriate, Effective, Safe, Accessible   Vascepa 1g 2 caps twice daily: Appropriate, Effective, Safe, Accessible  -Current treatment: Aspirin 81 mg daily: Appropriate, Effective, Safe, Accessible   Clopidogrel 75 mg daily: Appropriate, Effective, Safe, Accessible   -Medications previously tried: Eztimibe, Atorvastatin, Rosuvastatin, Livalo, colesevelam, cholestyramine  Patient interested in resuming PT in 2024. Will reach out to PCP to request new referral once her insurance covers the program.  -Recommended to continue current medication  Diabetes (A1c goal <8%) -Controlled -Complications: Peripheral neuropathy, nephropathy, PAD.  -Current medications: Glipizide XL 10 mg daily: Query Appropriate,  Effective, Safe, Accessible Lantus 15 units twice daily: Appropriate, Effective, Safe, Accessible  Ozempic 0.5 mg weekly: Appropriate, Effective, Safe, Accessible  -Medications previously tried: Trulicity (itching), -Current home glucose readings fasting glucose: 187, 157, 80,  -Current meal patterns: Not cooking at home. Baked meats. Drinks mainly water.  -Current exercise: Not able to exercise since her  amputation. Hopes to be able to get more activity and restart with PT.   -Patient potential candidate for SGLT-2, would not start in this patient given recent amputation.  -Denies hypoglycemic/hyperglycemic symptoms  Depression (Goal: maintain symptom remission) -Controlled: Not addressed during this visit. -Current treatment: Paroxetine 20 mg daily  -Medications previously tried/failed: NA  -PHQ9: 0 -Recommended to continue current medication  Insomnia (Goal: Improve sleep) -Controlled -Current treatment  None -Medications previously tried: NA -Recommended to continue current medication  Hypothyroidism (Goal: Maintain thyroid function) -Controlled: Not addressed during this visit. -Current treatment  Levothyroxine 125 mcg daily  -Medications previously tried: NA  -Recommended to continue current medication  GERD (Goal: Prevent heartburn symptoms) -Controlled: Not addressed during this visit. -Current treatment  Pantoprazole 40 mg daily  -Medications previously tried: NA -Patient reports rebound symptoms if she skips her pantoprazole.   -Recommended to continue current medication  Chronic Pain (Goal: Minimize pain) -Controlled: Not addressed during this visit. -Current treatment  Acetaminophen CR 650 mg every 8 hours as needed Simponi injections every 2 months  -Medications previously tried: NA -Counseled to avoid NSAID use.  -Counseled to limit acetaminophen to 3000 mg daily.   -Recommended to continue current medication  Chronic Kidney Disease Stage 3b  -All medications assessed for renal dosing and appropriateness in chronic kidney disease. -Recommended to continue current medication  Patient Goals/Self-Care Activities Patient will:  - check glucose daily before breakfast, document, and provide at future appointments check blood pressure weekly, document, and provide at future appointments  Follow Up Plan: Telephone follow up appointment with care management  team member scheduled for:  06/07/2022 at 11:00 AM      Patient agreed to services and verbal consent obtained.   The patient verbalized understanding of instructions, educational materials, and care plan provided today and DECLINED offer to receive copy of patient instructions, educational materials, and care plan.   Junius Argyle, PharmD, Para [place], CPP Clinical Pharmacist Practitioner  [facility]  [phone]

## 2021-12-24 ENCOUNTER — Other Ambulatory Visit: Payer: Self-pay | Admitting: Family Medicine

## 2021-12-24 DIAGNOSIS — K219 Gastro-esophageal reflux disease without esophagitis: Secondary | ICD-10-CM

## 2021-12-25 ENCOUNTER — Other Ambulatory Visit: Payer: Self-pay | Admitting: Family Medicine

## 2022-01-01 ENCOUNTER — Encounter: Payer: Self-pay | Admitting: Family Medicine

## 2022-01-01 ENCOUNTER — Ambulatory Visit (INDEPENDENT_AMBULATORY_CARE_PROVIDER_SITE_OTHER): Payer: Medicare Other | Admitting: Family Medicine

## 2022-01-01 VITALS — BP 118/70 | HR 78 | Temp 97.0°F | Ht 63.0 in | Wt 144.0 lb

## 2022-01-01 DIAGNOSIS — I739 Peripheral vascular disease, unspecified: Secondary | ICD-10-CM

## 2022-01-01 DIAGNOSIS — E785 Hyperlipidemia, unspecified: Secondary | ICD-10-CM | POA: Diagnosis not present

## 2022-01-01 DIAGNOSIS — S78112A Complete traumatic amputation at level between left hip and knee, initial encounter: Secondary | ICD-10-CM

## 2022-01-01 DIAGNOSIS — Z794 Long term (current) use of insulin: Secondary | ICD-10-CM | POA: Diagnosis not present

## 2022-01-01 DIAGNOSIS — E039 Hypothyroidism, unspecified: Secondary | ICD-10-CM | POA: Diagnosis not present

## 2022-01-01 DIAGNOSIS — F3342 Major depressive disorder, recurrent, in full remission: Secondary | ICD-10-CM

## 2022-01-01 DIAGNOSIS — E1142 Type 2 diabetes mellitus with diabetic polyneuropathy: Secondary | ICD-10-CM

## 2022-01-01 LAB — LIPID PANEL
Cholesterol: 95 mg/dL (ref 0–200)
HDL: 34.3 mg/dL — ABNORMAL LOW (ref >39.00)
NonHDL: 60.6
Total CHOL/HDL Ratio: 3
Triglycerides: 225 mg/dL — ABNORMAL HIGH (ref 0.0–149.0)
VLDL: 45 mg/dL — ABNORMAL HIGH (ref 0.0–40.0)

## 2022-01-01 LAB — URINALYSIS, ROUTINE W REFLEX MICROSCOPIC
Hgb urine dipstick: NEGATIVE
Ketones, ur: NEGATIVE
Nitrite: NEGATIVE
RBC / HPF: NONE SEEN (ref 0–?)
Specific Gravity, Urine: 1.025 (ref 1.000–1.030)
Total Protein, Urine: 100 — AB
Urine Glucose: NEGATIVE
Urobilinogen, UA: 0.2 (ref 0.0–1.0)
pH: 6 (ref 5.0–8.0)

## 2022-01-01 LAB — BASIC METABOLIC PANEL WITH GFR
BUN: 29 mg/dL — ABNORMAL HIGH (ref 6–23)
CO2: 24 meq/L (ref 19–32)
Calcium: 9.6 mg/dL (ref 8.4–10.5)
Chloride: 102 meq/L (ref 96–112)
Creatinine, Ser: 1.44 mg/dL — ABNORMAL HIGH (ref 0.40–1.20)
GFR: 34.69 mL/min — ABNORMAL LOW
Glucose, Bld: 162 mg/dL — ABNORMAL HIGH (ref 70–99)
Potassium: 4.3 meq/L (ref 3.5–5.1)
Sodium: 137 meq/L (ref 135–145)

## 2022-01-01 LAB — MICROALBUMIN / CREATININE URINE RATIO
Creatinine,U: 191.5 mg/dL
Microalb Creat Ratio: 91.5 mg/g — ABNORMAL HIGH (ref 0.0–30.0)
Microalb, Ur: 175.3 mg/dL — ABNORMAL HIGH (ref 0.0–1.9)

## 2022-01-01 LAB — HEMOGLOBIN A1C: Hgb A1c MFr Bld: 7.3 % — ABNORMAL HIGH (ref 4.6–6.5)

## 2022-01-01 LAB — TSH: TSH: 0.5 u[IU]/mL (ref 0.35–5.50)

## 2022-01-01 LAB — LDL CHOLESTEROL, DIRECT: Direct LDL: 33 mg/dL

## 2022-01-01 NOTE — Progress Notes (Signed)
[facility] [address] [place] [address] Dept: [phone] Dept Fax: [phone]  Chronic Care Office Visit  Subjective:    Patient ID: [name], female    DOB: February 05, 1940, 81 y.o..   MRN: [id number]  Chief Complaint  Patient presents with   Follow-up    3 month f/u.   No concerns.  Fasting today.     History of Present Illness:  Patient is in today for reassessment of chronic medical issues.  Ms. Christianne Borrow has a history of peripheral artery disease. 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  She now has a prosthesis. She is ambulating with either a walker or a cane in most situations. She had a repeat catheterization of the aorta and right lower extremity, with two further stent placements near the ankle. She feels the right leg is doing well at this point. She is hopeful to get into PT in 2024 and get away form needing to use her walker.   Ms. Aleshire has a past history of aortic stenosis and coronary artery disease. She had CABG x 2 and aortic valve replacement in 04/2020. She also has hypertension and hyperlipidemia.  She is managed on aspirin 81 mg daily, Plavix 75 mg daily, metoprolol tartrate 50 mg bid, and amlodipine 10 mg daily. She has been intolerant of statins and is on Repatha and Vascepa.   Ms. Colquitt has a history of Type 2 diabetes. She is managed on glipizide 10 mg , semaglutide (Ozempic) 0.5 mg weekly, and insulin glargine 15 units bid. Her diabetes is complicated with peripheral neuropathy.   Ms. Nease has a history of hypothyroidism. She is managed on levothyroxine 125 mcg daily.    Ms. Rathel has a history of depression . She is managed on paroxetine 20 mg daily. She notes her mood is doing quite well.  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   Primary insomnia 11/02/2020   Irritation of eyelid 07/26/2020   S/P aortic valve replacement with bioprosthetic valve 05/04/2020   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   Peripheral artery disease (HCC)    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[place]) 04/07/2019   Low back pain 02/03/2019   Scoliosis deformity of spine 02/03/2019   Osteoarthritis of hip 11/17/2018   Claudication ([place])    Chronic headaches 11/12/2017   Degeneration of lumbar intervertebral disc 06/13/2017   Hyperkalemia 06/03/2017   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[place])    Mass of left adrenal gland ([place]) 04/19/2017   Spinal stenosis of lumbar region 10/20/2014   Hyperlipidemia 03/16/2013   Dense breasts 12/22/2012   Anxiety state 04/16/2012   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/16/2012   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   Osteoarthritis of knee 04/16/2012   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 female breast ([place]) 12/20/2010   History of breast cancer 12/05/2010   Depression 09/14/2008   GERD 09/14/2008   Type 2 diabetes mellitus with neurologic  complication, with long-term current use of insulin ([place]) 05/20/2008   Coronary atherosclerosis 04/17/2007   Hypothyroidism 04/03/2007   Past Surgical History:  Procedure Laterality Date   ABDOMINAL AORTOGRAM W/LOWER EXTREMITY Bilateral 04/29/2019   Procedure: ABDOMINAL AORTOGRAM W/LOWER EXTREMITY;  Surgeon: Marty Heck, MD;  Location: [facility];  Service: Cardiovascular;  Laterality: Bilateral;   ABDOMINAL AORTOGRAM W/LOWER EXTREMITY Left 07/01/2019    Procedure: ABDOMINAL AORTOGRAM W/ Left LOWER EXTREMITY [place];  Surgeon: Marty Heck, MD;  Location: [facility];  Service: Cardiovascular;  Laterality: Left;   ABDOMINAL AORTOGRAM W/LOWER EXTREMITY Left 03/03/2021   Procedure: ABDOMINAL AORTOGRAM W/LOWER EXTREMITY;  Surgeon: Cherre Robins, MD;  Location: [facility];  Service: Cardiovascular;  Laterality: Left;   ABDOMINAL AORTOGRAM W/LOWER EXTREMITY Right 06/12/2021   Procedure: ABDOMINAL AORTOGRAM W/LOWER EXTREMITY;  Surgeon: Waynetta Sandy, MD;  Location: [facility];  Service: Cardiovascular;  Laterality: Right;  LOWER LEG   AMPUTATION Left 03/06/2021   Procedure: LEFT ABOVE KNEE AMPUTATION;  Surgeon: [name], MD;  Location: [facility];  Service: Vascular;  Laterality: Left;   ANTERIOR AND POSTERIOR REPAIR N/A 04/05/2016   Procedure: ANTERIOR (CYSTOCELE) AND POSTERIOR REPAIR (RECTOCELE);  Surgeon: [name], MD;  Location: [facility];  Service: Gynecology;  Laterality: N/A;   AORTIC VALVE REPLACEMENT N/A 05/04/2020   Procedure: AORTIC VALVE REPLACEMENT (AVR) USING INSPIRIS 23MM RESILIA AORTIC VALVE;  Surgeon: Rexene Alberts, MD;  Location: [facility];  Service: Open Heart Surgery;  Laterality: N/A;   BREAST BIOPSY     BREAST LUMPECTOMY  1983   benign biopsy   BREAST LUMPECTOMY Right 12/29/2010   snbx, ER/PR +, Her2 -, 0/1 node pos.   CARPAL TUNNEL RELEASE Bilateral 9/12,8,12   CHOLECYSTECTOMY     COLONOSCOPY N/A 02/09/2013   Procedure: COLONOSCOPY;  Surgeon: [name], MD;  [facility];  Service: Endoscopy;  Laterality: N/A;   COLONOSCOPY     CORONARY ANGIOPLASTY     CORONARY ARTERY BYPASS GRAFT N/A 05/04/2020   Procedure: CORONARY ARTERY BYPASS GRAFTING (CABG), ON PUMP, TIMES TWO, USING LEFT INTERNAL MAMMARY ARTERY AND RIGHT ENDOSCOPICALLY HARVESTED GREATER SAPHENOUS VEIN;  Surgeon: Rexene Alberts, MD;  Location: [facility];  Service: Open Heart Surgery;  Laterality: N/A;   DILATION AND  CURETTAGE OF UTERUS     ESOPHAGOGASTRODUODENOSCOPY N/A 02/09/2013   Procedure: ESOPHAGOGASTRODUODENOSCOPY (EGD);  Surgeon: [name], MD;  Location: [name];  Service: Endoscopy;  Laterality: N/A;   FOOT SPURS     HYSTEROSCOPY WITH D & C N/A 09/11/2012   Procedure: DILATATION AND CURETTAGE ;  Surgeon: [name], MD;  Location: [facility];  Service: Gynecology;  Laterality: N/A;   KNEE SURGERY  2007   LAPAROSCOPIC ASSISTED VAGINAL HYSTERECTOMY N/A 04/05/2016   Procedure: LAPAROSCOPIC ASSISTED VAGINAL HYSTERECTOMY possible BSO;  Surgeon: [name], MD;  Location: [facility];  Service: Gynecology;  Laterality: N/A;   LUMBAR LAMINECTOMY/DECOMPRESSION MICRODISCECTOMY N/A 10/20/2014   Procedure: MICRO LUMBER DECOMPRESSION L3-4 L4-5;  Surgeon: [name], MD;  [facility];  Service: Orthopedics;  Laterality: N/A;   LUMBAR LAMINECTOMY/DECOMPRESSION MICRODISCECTOMY Bilateral 10/13/2015   Procedure: MICRO LUMBAR DECOMPRESSION L5 - S1 AND REDO DECOMPRESSION L4 - L5 AND REMOVAL OF FACET CYST L4 - L5 2 LEVELS;  Surgeon: [name], MD;  [facility];  Service: Orthopedics;  Laterality: Bilateral;   PERIPHERAL VASCULAR ATHERECTOMY Right 06/12/2021   Procedure: PERIPHERAL VASCULAR ATHERECTOMY;  Surgeon: Waynetta Sandy,  MD;  Location: [facility];  Service: Cardiovascular;  Laterality: Right;  SFA/POP/AT   PERIPHERAL VASCULAR INTERVENTION Right 04/29/2019   Procedure: PERIPHERAL VASCULAR INTERVENTION;  Surgeon: Marty Heck, MD;  Location: [facility];  Service: Cardiovascular;  Laterality: Right;   PERIPHERAL VASCULAR INTERVENTION Right 06/12/2021   Procedure: PERIPHERAL VASCULAR INTERVENTION;  Surgeon: Waynetta Sandy, MD;  Location: [facility];  Service: Cardiovascular;  Laterality: Right;  SFP/POPLITEAL/AT   PORT-A-CATH REMOVAL  04/17/2011   Procedure: REMOVAL PORT-A-CATH;  Surgeon: [name], MD;  Location: [facility];  Service: General;  Laterality: Left;   PORTACATH PLACEMENT  02/07/2011   Procedure: INSERTION PORT-A-CATH;  Surgeon: [name], MD;  [facility];  Service: General;  Laterality: N/A;   RIGHT/LEFT HEART CATH AND CORONARY ANGIOGRAPHY N/A 03/17/2020   Procedure: RIGHT/LEFT HEART CATH AND CORONARY ANGIOGRAPHY;  Surgeon: Burnell Blanks, MD;  Location: [facility];  Service: Cardiovascular;  Laterality: N/A;   TEE WITHOUT CARDIOVERSION N/A 05/04/2020   Procedure: TRANSESOPHAGEAL ECHOCARDIOGRAM (TEE);  Surgeon: Rexene Alberts, MD;  Location: [facility];  Service: Open Heart Surgery;  Laterality: N/A;   UPPER GASTROINTESTINAL ENDOSCOPY     Family History  Problem Relation Age of Onset   Kidney disease Mother    Heart disease Mother    Throat cancer Father    Alcoholism Father    Heart attack Father    Lymphoma Brother 2   Heart attack Brother    Diabetes Brother    Hypertension Brother    Cancer Son    Colon cancer Neg Hx    Stroke Neg Hx    Esophageal cancer Neg Hx    Rectal cancer Neg Hx    Stomach cancer Neg Hx    Outpatient Medications Prior to Visit  Medication Sig Dispense Refill   Accu-Chek FastClix Lancets MISC USE TO CHECK BLOOD SUGARS 3 TIMES DAILY 306 each 3   amLODipine (NORVASC) 10 MG tablet Take 10 mg by mouth daily.     ascorbic acid (VITAMIN C) 250 MG tablet Take 1 tablet (250 mg total) by mouth 2 (two) times daily. 60 tablet 0   aspirin EC 81 MG tablet Take 81 mg by mouth in the morning.     blood glucose meter kit and supplies Dispense based on patient and insurance preference. Use up to four times daily as directed. (FOR ICD-10 E10.9, E11.9). 1 each 0   Cholecalciferol (D3 5000 PO) Take by mouth daily.     clopidogrel (PLAVIX) 75 MG tablet Take 1 tablet (75 mg total) by mouth daily. 30 tablet 11   glipiZIDE (GLUCOTROL XL) 10 MG 24 hr tablet Take 1 tablet (10 mg total) by mouth daily with breakfast. 90 tablet 3   glucose blood (ACCU-CHEK  GUIDE) test strip TEST ONCE DAILY 100 each 5   Golimumab (SIMPONI ARIA IV) Inject into the vein every 2 (two) months.     icosapent Ethyl (VASCEPA) 1 g capsule TAKE 2 CAPSULES(2 GRAMS) BY MOUTH TWICE DAILY 120 capsule 9   Insulin Pen Needle (CAREFINE PEN NEEDLES) 32G X 6 MM MISC 1 application by Does not apply route 2 (two) times daily. 120 each 1   LANTUS SOLOSTAR 100 UNIT/ML Solostar Pen ADMINISTER 15 UNITS UNDER THE SKIN TWICE DAILY 15 mL 3   levothyroxine (SYNTHROID) 125 MCG tablet TAKE 1 TABLET BY MOUTH DAILY 90 tablet 3   metoprolol tartrate (LOPRESSOR) 50 MG tablet Take 1  tablet (50 mg total) by mouth 2 (two) times daily. 30 tablet 0   pantoprazole (PROTONIX) 40 MG tablet TAKE 1 TABLET BY MOUTH DAILY 90 tablet 3   PARoxetine (PAXIL) 20 MG tablet Take 1 tablet (20 mg total) by mouth in the morning. 90 tablet 3   REPATHA SURECLICK 782 MG/ML SOAJ INJECT 1 PEN INTO THE SKIN EVERY 14 DAYS 2 mL 11   Semaglutide,0.25 or 0.5MG/DOS, 2 MG/1.5ML SOPN Inject 0.5 mg into the skin every Tuesday. 3 mL 6   valACYclovir (VALTREX) 1000 MG tablet Take 1 tablet (1,000 mg total) by mouth daily as needed (Fever Blisters). 180 tablet 0   OZEMPIC, 0.25 OR 0.5 MG/DOSE, 2 MG/3ML SOPN INJECT SUBCUTANEOUSLY 0.5 MG  EVERY WEEK 9 mL 3   Multiple Vitamin (MULTIVITAMIN) tablet Take 1 tablet by mouth daily. (Patient not taking: Reported on 01/01/2022)     No facility-administered medications prior to visit.   Allergies  Allergen Reactions   Codeine Nausea And Vomiting and Other (See Comments)    Severe stomach cramps, also Other reaction(s): Unknown   Antihistamines, Diphenhydramine-Type Other (See Comments)    Causes hyperactivity   Diphenhydramine     Other reaction(s): Unknown   Folic Acid Other (See Comments)    Other reaction(s): tongue pain   Gabapentin     Urinary incontinence   Lyrica [Pregabalin]     Double vision/falls   Methotrexate Other (See Comments)    Other reaction(s): oral ulcers    Oxycodone     Hallucinations/crazy feelings   Rheumate [Fish Oil]     Other reaction(s): hypoglycemia   Statins Other (See Comments)    Joint pains Other reaction(s): Unknown   Sulfa Antibiotics Other (See Comments)    Reaction not recalled   Erythromycin Hives, Rash and Other (See Comments)    Allergic due to dental work from 50 years ago. It was used in packing and resulted in rash/hives inside and outside of mouth.   Trulicity [Dulaglutide] Itching     Objective:   Today's Vitals   01/01/22 0834  BP: 118/70  Pulse: 78  Temp: (!) 97 F (36.1 C)  TempSrc: Temporal  SpO2: 98%  Weight: 144 lb (65.3 kg)  Height: _0  (1.6 m)   Body mass index is 25.51 kg/m.   General: Well developed, well nourished. No acute distress. Psych: Alert and oriented. Normal mood and affect.  Health Maintenance Due  Topic Date Due   Hepatitis C Screening  Never done   DTaP/Tdap/Td (1 - Tdap) Never done   Zoster Vaccines- Shingrix (2 of 2) 03/29/2011   Diabetic kidney evaluation - Urine ACR  11/02/2021      01/01/2022    8:57 AM 10/03/2021    9:32 AM 09/12/2021   11:07 AM  Depression screen PHQ 2/9  Decreased Interest 0 0 0  Down, Depressed, Hopeless 0 0 0  PHQ - 2 Score 0 0 0  Altered sleeping 1 0   Tired, decreased energy 0 0   Change in appetite 0 0   Feeling bad or failure about yourself  0 0   Trouble concentrating 0 0   Moving slowly or fidgety/restless 0 0   Suicidal thoughts 0 0   PHQ-9 Score 1 0   Difficult doing work/chores Not difficult at all Not difficult at all       01/01/2022    8:57 AM 10/03/2021    9:32 AM 02/02/2021    8:34 AM  GAD 7 :  Generalized Anxiety Score  Nervous, Anxious, on Edge 0 0 0  Control/stop worrying 0 0 0  Worry too much - different things 0 0 0  Trouble relaxing 0 0 0  Restless 0 0 0  Easily annoyed or irritable 0 0 0  Afraid - awful might happen 0 0 0  Total GAD 7 Score 0 0 0  Anxiety Difficulty Not difficult at all Not difficult at all  Not difficult at all     Assessment & Plan:   1. Type 2 diabetes mellitus with diabetic polyneuropathy, with long-term current use of insulin (HCC) We will reassess annual DM labs today. Continue glipizide 10 mg , semaglutide (Ozempic) 0.5 mg weekly, and insulin glargine 15 units bid.  - Microalbumin / creatinine urine ratio - Basic metabolic panel - Hemoglobin A1c - Urinalysis, Routine w reflex microscopic  2. Diabetic peripheral neuropathy (HCC) We will continue to focus on good blood sugar control to reduce risk for progression.  3. 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[place]) Stump well healed and managing with the prosthesis. Ms. Aderhold will let me know if she needs a referral for PT.  4. Peripheral artery disease (HCC) Stable. Continue lipid management.  5. Acquired hypothyroidism We will reassess the TSH today. Continue levothyroxine 125 mcg daily.  - TSH  6. Hyperlipidemia, unspecified hyperlipidemia type We will reassess lipids today. Continue Vascepa 2 gm bid and Repatha.  - Lipid panel  7. Recurrent major depressive disorder, in full remission ([place]) Stable. Continue paroxetine 20 mg daily.   Return in about 3 months (around 04/02/2022) for Reassessment.   [name], MD

## 2022-01-22 HISTORY — PX: CATARACT EXTRACTION: SUR2

## 2022-01-31 ENCOUNTER — Telehealth: Payer: Self-pay | Admitting: Family Medicine

## 2022-01-31 DIAGNOSIS — S78112A Complete traumatic amputation at level between left hip and knee, initial encounter: Secondary | ICD-10-CM

## 2022-01-31 NOTE — Telephone Encounter (Signed)
Caller Name: Rhesa Forsberg Call back phone #: [phone]  Reason for Call: Pt is hoping to get a referral placed for physical therapy. Asking for it to be sent to [facility] PT attention of Robbin Harris

## 2022-02-02 ENCOUNTER — Telehealth: Payer: Self-pay | Admitting: Family Medicine

## 2022-02-02 ENCOUNTER — Other Ambulatory Visit (HOSPITAL_COMMUNITY): Payer: Self-pay

## 2022-02-02 NOTE — Telephone Encounter (Signed)
Request/ note was sent to provider this morning.  Dm/cma

## 2022-02-02 NOTE — Telephone Encounter (Signed)
Pt called and wanted to know if you set up a referral appointment for her yet

## 2022-02-05 ENCOUNTER — Telehealth: Payer: Self-pay | Admitting: Family Medicine

## 2022-02-05 NOTE — Telephone Encounter (Signed)
Patient received call to schedule [facility].  Nothing further needed.  Dm/cma

## 2022-02-05 NOTE — Telephone Encounter (Signed)
Can you please change the referral to  [facility] Outpatient PT attention of Koleen Distance    thank you. Dm/cma

## 2022-02-05 NOTE — Telephone Encounter (Signed)
See other message.  Dm/cma  

## 2022-02-05 NOTE — Telephone Encounter (Signed)
Pt said can you please call her again

## 2022-02-05 NOTE — Telephone Encounter (Signed)
Patient got a call to schedule the PT with [facility]. Dm/cma

## 2022-02-06 NOTE — Therapy (Signed)
OUTPATIENT PHYSICAL THERAPY PROSTHETIC EVALUATION   Patient Name: [name] MRN: [id number] DOB:1940/02/06, 82 y.o., female Today's Date: 02/07/2022  PCP: [name], MD REFERRING PROVIDER: [name], MD  END OF SESSION:  PT End of Session - 02/07/22 1142     Visit Number 1    Number of Visits 14    Date for PT Re-Evaluation 05/08/22    Authorization Type [employer] Medicare    Authorization Time Period no co-pay, no visit limit    Progress Note Due on Visit 10    PT Start Time 1050    PT Stop Time 1138    PT Time Calculation (min) 48 min    Equipment Utilized During Treatment Gait belt    Activity Tolerance Patient tolerated treatment well    Behavior During Therapy WFL for tasks assessed/performed             Past Medical History:  Diagnosis Date   Allergy    Anemia    childhood   Anxiety    Aortic stenosis    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   Arthritis    Breast cancer ([place]) 12/05/10   R breast, inv mammary, in situ,ER/PR +,HER2 -   Cancer ([place])    Carcinoma of breast treated with adjuvant chemotherapy ([place])    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[place])    Claudication ([place])    Coronary artery disease    stent 2007   Depression    Diabetes mellitus    Difficulty sleeping    Diverticulosis 12/10/03   Elevated cholesterol    Generalized weakness    GERD (gastroesophageal reflux disease)    Heart murmur    Hepatitis    as an infant   HSV-1 (herpes simplex virus 1) infection    Hyperlipidemia    Hypertension    Hypothyroidism    Osteoarthritis    PAD (peripheral artery disease) ([place])    Personal history of chemotherapy    Personal history of radiation therapy    Pneumonia    2014 september and March 2022   Rheumatoid arthritis ([place])    S/P aortic valve replacement with bioprosthetic valve 05/04/2020   [facility] Inspiris Resilia stented bovine pericardial tissue valve, size 23 mm   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   LIMA to D3, SVG to  PDA, EVH via right thigh   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   Dr [name]   Spinal stenosis    Thyroid disease    Past Surgical History:  Procedure Laterality Date   ABDOMINAL AORTOGRAM W/LOWER EXTREMITY Bilateral 04/29/2019   Procedure: ABDOMINAL AORTOGRAM W/LOWER EXTREMITY;  Surgeon: Marty Heck, MD;  Location: [facility];  Service: Cardiovascular;  Laterality: Bilateral;   ABDOMINAL AORTOGRAM W/LOWER EXTREMITY Left 07/01/2019   Procedure: ABDOMINAL AORTOGRAM W/ Left LOWER EXTREMITY [place];  Surgeon: Marty Heck, MD;  Location: [facility];  Service: Cardiovascular;  Laterality: Left;   ABDOMINAL AORTOGRAM W/LOWER EXTREMITY Left 03/03/2021   Procedure: ABDOMINAL AORTOGRAM W/LOWER EXTREMITY;  Surgeon: Cherre Robins, MD;  Location: [facility];  Service: Cardiovascular;  Laterality: Left;   ABDOMINAL AORTOGRAM W/LOWER EXTREMITY Right 06/12/2021   Procedure: ABDOMINAL AORTOGRAM W/LOWER EXTREMITY;  Surgeon: Waynetta Sandy, MD;  Location: [facility];  Service: Cardiovascular;  Laterality: Right;  LOWER LEG   AMPUTATION Left 03/06/2021   Procedure: LEFT ABOVE KNEE AMPUTATION;  Surgeon: [name],  MD;  [facility];  Service: Vascular;  Laterality: Left;   ANTERIOR AND POSTERIOR REPAIR N/A 04/05/2016   Procedure: ANTERIOR (CYSTOCELE) AND POSTERIOR REPAIR (RECTOCELE);  Surgeon: [name], MD;  Location: [facility];  Service: Gynecology;  Laterality: N/A;   AORTIC VALVE REPLACEMENT N/A 05/04/2020   Procedure: AORTIC VALVE REPLACEMENT (AVR) USING INSPIRIS 23MM RESILIA AORTIC VALVE;  Surgeon: Rexene Alberts, MD;  Location: [facility];  Service: Open Heart Surgery;  Laterality: N/A;   BREAST BIOPSY     BREAST LUMPECTOMY  1983   benign biopsy   BREAST LUMPECTOMY Right 12/29/2010   snbx, ER/PR +, Her2 -, 0/1 node pos.   CARPAL TUNNEL RELEASE Bilateral 9/12,8,12   CHOLECYSTECTOMY     COLONOSCOPY N/A 02/09/2013   Procedure: COLONOSCOPY;   Surgeon: [name], MD;  [facility];  Service: Endoscopy;  Laterality: N/A;   COLONOSCOPY     CORONARY ANGIOPLASTY     CORONARY ARTERY BYPASS GRAFT N/A 05/04/2020   Procedure: CORONARY ARTERY BYPASS GRAFTING (CABG), ON PUMP, TIMES TWO, USING LEFT INTERNAL MAMMARY ARTERY AND RIGHT ENDOSCOPICALLY HARVESTED GREATER SAPHENOUS VEIN;  Surgeon: Rexene Alberts, MD;  Location: [facility];  Service: Open Heart Surgery;  Laterality: N/A;   DILATION AND CURETTAGE OF UTERUS     ESOPHAGOGASTRODUODENOSCOPY N/A 02/09/2013   Procedure: ESOPHAGOGASTRODUODENOSCOPY (EGD);  Surgeon: [name], MD;  Location: [name];  Service: Endoscopy;  Laterality: N/A;   FOOT SPURS     HYSTEROSCOPY WITH D & C N/A 09/11/2012   Procedure: DILATATION AND CURETTAGE ;  Surgeon: [name], MD;  Location: [facility];  Service: Gynecology;  Laterality: N/A;   KNEE SURGERY  2007   LAPAROSCOPIC ASSISTED VAGINAL HYSTERECTOMY N/A 04/05/2016   Procedure: LAPAROSCOPIC ASSISTED VAGINAL HYSTERECTOMY possible BSO;  Surgeon: [name], MD;  Location: [facility];  Service: Gynecology;  Laterality: N/A;   LUMBAR LAMINECTOMY/DECOMPRESSION MICRODISCECTOMY N/A 10/20/2014   Procedure: MICRO LUMBER DECOMPRESSION L3-4 L4-5;  Surgeon: [name], MD;  [facility];  Service: Orthopedics;  Laterality: N/A;   LUMBAR LAMINECTOMY/DECOMPRESSION MICRODISCECTOMY Bilateral 10/13/2015   Procedure: MICRO LUMBAR DECOMPRESSION L5 - S1 AND REDO DECOMPRESSION L4 - L5 AND REMOVAL OF FACET CYST L4 - L5 2 LEVELS;  Surgeon: [name], MD;  [facility];  Service: Orthopedics;  Laterality: Bilateral;   PERIPHERAL VASCULAR ATHERECTOMY Right 06/12/2021   Procedure: PERIPHERAL VASCULAR ATHERECTOMY;  Surgeon: Waynetta Sandy, MD;  Location: [facility];  Service: Cardiovascular;  Laterality: Right;  SFA/POP/AT   PERIPHERAL VASCULAR INTERVENTION Right 04/29/2019   Procedure: PERIPHERAL VASCULAR INTERVENTION;  Surgeon: Marty Heck, MD;  Location: [facility];  Service: Cardiovascular;  Laterality: Right;   PERIPHERAL VASCULAR INTERVENTION Right 06/12/2021   Procedure: PERIPHERAL VASCULAR INTERVENTION;  Surgeon: Waynetta Sandy, MD;  Location: [facility];  Service: Cardiovascular;  Laterality: Right;  SFP/POPLITEAL/AT   PORT-A-CATH REMOVAL  04/17/2011   Procedure: REMOVAL PORT-A-CATH;  Surgeon: [name], MD;  Location: [facility];  Service: General;  Laterality: Left;   PORTACATH PLACEMENT  02/07/2011   Procedure: INSERTION PORT-A-CATH;  Surgeon: [name], MD;  [facility];  Service: General;  Laterality: N/A;   RIGHT/LEFT HEART CATH AND CORONARY ANGIOGRAPHY N/A 03/17/2020   Procedure: RIGHT/LEFT HEART CATH AND CORONARY ANGIOGRAPHY;  Surgeon: Burnell Blanks, MD;  Location: [facility];  Service: Cardiovascular;  Laterality: N/A;   TEE WITHOUT CARDIOVERSION N/A 05/04/2020   Procedure: TRANSESOPHAGEAL ECHOCARDIOGRAM (  TEE);  Surgeon: Rexene Alberts, MD;  Location: [facility];  Service: Open Heart Surgery;  Laterality: N/A;   UPPER GASTROINTESTINAL ENDOSCOPY     Patient Active Problem List   Diagnosis Date Noted   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[place]) 03/13/2021   Sinus tachycardia 03/11/2021   Elevated troponin 03/08/2021   Stroke-like symptoms 03/08/2021   Anemia of chronic disease 03/08/2021   Acute supraglottitis with epiglottitis 02/05/2021   Epiglottitis 02/05/2021   Reactive airway disease 01/20/2021   Diabetic peripheral neuropathy ([place]) 12/19/2020   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   Primary insomnia 11/02/2020   Irritation of eyelid 07/26/2020   S/P aortic valve replacement with bioprosthetic valve 05/04/2020   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   Peripheral artery disease ([facility])    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[place]) 04/07/2019   Low back pain 02/03/2019   Scoliosis deformity of spine  02/03/2019   Osteoarthritis of hip 11/17/2018   Claudication ([place])    Chronic headaches 11/12/2017   Degeneration of lumbar intervertebral disc 06/13/2017   Hyperkalemia 06/03/2017   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[place])    Mass of left adrenal gland ([place]) 04/19/2017   Spinal stenosis of lumbar region 10/20/2014   Hyperlipidemia 03/16/2013   Dense breasts 12/22/2012   Anxiety state 04/16/2012   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/16/2012   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   Osteoarthritis of knee 04/16/2012   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 female breast ([place]) 12/20/2010   History of breast cancer 12/05/2010   Depression 09/14/2008   GERD 09/14/2008   Type 2 diabetes mellitus with neurologic complication, with long-term current use of insulin ([place]) 05/20/2008   Coronary atherosclerosis 04/17/2007   Hypothyroidism 04/03/2007    ONSET DATE: 02/02/2022 MD referral to PT  REFERRING DIAG: S50.539J (ICD-10-CM) - Above knee amputation of left lower extremity   THERAPY DIAG:  Other abnormalities of gait and mobility  Unsteadiness on feet  Muscle weakness (generalized)  Stiffness of left hip, not elsewhere classified  Abnormal posture  History of falling  Other low back pain  Rationale for Evaluation and Treatment: Rehabilitation  SUBJECTIVE:   SUBJECTIVE STATEMENT: This 82yo female underwent a left Transfemoral Amputation on 03/06/2021 and had right lower extremity vascular surgery on 06/12/2021. She had PT at [facility] 07/04/21 - 08/29/21.   She has been limited in standing & gait by both surgeries.  She received her prosthesis on 06/23/2021 and no socket revision. She uses RW, rollator walker. About week ago she started using [facility] with husband or table. She wears prosthesis daily, donnes 2-4 hours after arising, night bath 2-3 hours before bed and does not redonne prosthesis. She sleeps in recliner because her back hurts when  sleeps in bed.   PERTINENT HISTORY: Left TFA, DM2, neuropathy, PVD, CKD st4, RLE stent, aortic stenosis, CAD, CABG X 2, HTN, OA, lumbar spinal stenosis, breast CA  PAIN:  Are you having pain? Yes: NPRS scale: today 1/10 and in last week 4/10 Pain location: right lumbar  Pain description: achy Aggravating factors: walking without shoes with rollator Relieving factors: sit down  PRECAUTIONS: Fall  WEIGHT BEARING RESTRICTIONS: No  FALLS: Has patient fallen in last 6 months? Yes. Number of falls 3 no injuries.    LIVING ENVIRONMENT: Lives with: lives with their spouse Lives in: House single level  Home Access: Stairs to enter at back main entrance and Ramped entrance front  Stairs: Yes: External: 5 steps; can reach both Has following equipment at home: Quad cane small base, Walker - 2 wheeled, Environmental consultant - 4 wheeled, Wheelchair (manual), shower chair, Shower bench, and Grab bars  PLOF: prior to amputation Independent, Independent with household mobility without device, and Independent with community mobility without device   PATIENT GOALS:   to use prosthesis with cane for community, gardening  OBJECTIVE:  COGNITION: Overall cognitive status: Within functional limits for tasks assessed  MUSCLE LENGTH: Hamstrings:  Thomas test: Left -37 deg  POSTURE: rounded shoulders, forward head, increased lumbar lordosis, increased thoracic kyphosis, flexed trunk , and weight shift right  LOWER EXTREMITY ROM:  ROM P:passive  A:active Right eval Left eval  Hip flexion    Hip extension Standing -27* excessive lordosis   Hip abduction    Hip adduction    Hip internal rotation    Hip external rotation    Knee flexion    Knee extension    Ankle dorsiflexion    Ankle plantarflexion    Ankle inversion    Ankle eversion     (Blank rows = not tested)  LOWER EXTREMITY MMT:  MMT Right eval Left eval  Hip flexion    Hip extension    Hip abduction    Hip adduction    Hip internal  rotation    Hip external rotation    Knee flexion    Knee extension    Ankle dorsiflexion    Ankle plantarflexion    Ankle inversion    Ankle eversion    (Blank rows = not tested)  TRANSFERS: Sit to stand: SBA requires UE assist on armrest, prosthetic knee locked delayed in process impairing stabilization upon arising. Uses back of legs against chair & external support to stabilize Stand to sit: SBA uses back of legs against chair for stability. External support required & armrests on chair.   GAIT: Gait pattern: step to pattern, decreased arm swing- Left, decreased step length- Right, decreased stance time- Left, circumduction- Left, Left hip hike, antalgic, lateral hip instability, trunk flexed, and abducted- Left Distance walked: 100' Assistive device utilized: Lobbyist with HHA (required) and Walker - 4 wheeled Level of assistance: Mod A with SBQC / HHA and SBA / verbal cues for rollator Gait velocity: 1.22 ft/sec with rollator walker  FUNCTIONAL TESTs:  Eval (02/07/2022): Timed up and go (TUG): 28.97 sec with rollator Berg Balance Scale: 21/56  [facility] PT Assessment - 02/07/22 1100       Standardized Balance Assessment   Standardized Balance Assessment Berg Balance Test;Timed Up and Go Test      Berg Balance Test   Sit to Stand Able to stand  independently using hands    Standing Unsupported Able to stand safely 2 minutes    Sitting with Back Unsupported but Feet Supported on Floor or Stool Able to sit safely and securely 2 minutes    Stand to Sit Controls descent by using hands    Transfers Able to transfer safely, definite need of hands    Standing Unsupported with Eyes Closed Able to stand 3 seconds    Standing Unsupported with Feet Together Needs help to attain position but able to stand for 30 seconds with feet together    From Standing, Reach Forward with Outstretched Arm Reaches forward but needs supervision    From Standing Position, Pick up Object from  Floor Unable to try/needs assist to keep balance    From Standing Position, Turn to  Look Behind Over each Shoulder Needs assist to keep from losing balance and falling    Turn 360 Degrees Needs assistance while turning    Standing Unsupported, Alternately Place Feet on Step/Stool Needs assistance to keep from falling or unable to try    Standing Unsupported, One Foot in [employer] balance while stepping or standing    Standing on One Leg Unable to try or needs assist to prevent fall    Total Score 21    Berg comment: < 36 high risk for falls (close to 100%) 46-51 moderate (>50%)   37-45 significant (>80%) 52-55 lower (> 25%)      Timed Up and Go Test   Normal TUG (seconds) 28.97   rollator walker & prosthesis   TUG Comments TUG:  Normal: >13.5 sec indicates high fall risk  Manual: > 14.5 sec indicates high fall risk  Cognitive: >15 sec indicates high fall risk             CURRENT PROSTHETIC WEAR ASSESSMENT:  Patient is independent with: skin check, residual limb care, and prosthetic cleaning Patient is dependent with: correct ply sock adjustment, proper wear schedule/adjustment, and proper weight-bearing schedule/adjustment Donning prosthesis: Modified independence per pt report Doffing prosthesis: Modified independence per pt report Prosthetic wear tolerance: ~8 hours/day, 7 days/week (~60-70% of awake hours) Prosthetic weight bearing tolerance: 8 minutes with no c/o limb pain Residual limb condition: pt reports no issues Prosthetic description: silicon liner with suction ring suspension, ischial containment socket with flexible inner socket, non-hydraulic knee,     TODAY'S TREATMENT:                                                                                                                             DATE:  02/07/2022: Prosthetic care education  PATIENT EDUCATION: PATIENT EDUCATED ON FOLLOWING PROSTHETIC CARE: Education details: sitting positioning to decrease back pain,  when prosthesis is off higher fall risk & RLE stress and Proper wear schedule/adjustment PT recommending to donne upon arising, doffe for evening bath (washing liner) and redonne after bath with other liner until bedtime.  Person educated: Patient Education method: Explanation, Demonstration, and Verbal cues Education comprehension: verbalized understanding, verbal cues required, tactile cues required, and needs further education  HOME EXERCISE PROGRAM:  ASSESSMENT:  CLINICAL IMPRESSION: Patient is a 82 y.o. female who was seen today for physical therapy evaluation and treatment for prosthetic training with Left Transfemoral Amputation. She has decreased flexibility especially hip flexors, generalized weakness and low endurance / activity tolerance.  Berg Balance Test 21/56 indicates high fall risk and dependency in standing ADLs.  Patient has gait deviations and dependency on BUE support indicating high fall risk.  Patient would benefit from skilled PT to improve safety & function with her Transfemoral prosthesis.   OBJECTIVE IMPAIRMENTS: Abnormal gait, decreased activity tolerance, decreased balance, decreased endurance, decreased knowledge of condition, decreased knowledge of use of DME, decreased mobility, difficulty walking, decreased ROM, decreased strength, impaired  flexibility, postural dysfunction, prosthetic dependency , and pain.   ACTIVITY LIMITATIONS: carrying, lifting, bending, sitting, standing, sleeping, stairs, transfers, and locomotion level  PARTICIPATION LIMITATIONS: meal prep, cleaning, laundry, community activity, and yard work  PERSONAL FACTORS: Age, Fitness, Past/current experiences, Time since onset of injury/illness/exacerbation, and 3+ comorbidities: see PMH  are also affecting patient's functional outcome.   REHAB POTENTIAL: Good  CLINICAL DECISION MAKING: Evolving/moderate complexity  EVALUATION COMPLEXITY: Moderate   GOALS: Goals reviewed with patient?  Yes  SHORT TERM GOALS: Target date: 03/12/2022  Patient verbalizes & demonstrates understanding of HEP. Baseline: SEE OBJECTIVE DATA Goal status: INITIAL 2.  Patient reports donning prosthesis within 90 min of arising in mornings.  Baseline: SEE OBJECTIVE DATA Goal status: INITIAL  3.  Patient sit to/from stand from chairs without armrests using UEs & stabilizes correctly. Baseline: SEE OBJECTIVE DATA Goal status: INITIAL  4. Patient ambulates 13' with [facility] & prosthesis with minA. Baseline: SEE OBJECTIVE DATA Goal status: INITIAL   LONG TERM GOALS: Target date: 05/08/2022  Patient demonstrates & verbalized understanding of prosthetic care to enable safe utilization of prosthesis. Baseline: SEE OBJECTIVE DATA Goal status: INITIAL  Patient tolerates prosthesis wear >90% of awake hours without skin or limb pain issues. Baseline: SEE OBJECTIVE DATA Goal status: INITIAL  Berg Balance >/= 36/56 to indicate lower fall risk Baseline: SEE OBJECTIVE DATA Goal status: INITIAL  Patient ambulates >200' with prosthesis & SBQC modified independently Baseline: SEE OBJECTIVE DATA Goal status: INITIAL  Patient negotiates ramps, curbs & stairs with single rail with prosthesis & SBQC with family safely.  Baseline: SEE OBJECTIVE DATA Goal status: INITIAL  Patient verbalizes & demonstrates understanding of ongoing HEP Baseline: SEE OBJECTIVE DATA Goal status: INITIAL  7.  Patient reports low back pain increases </= 2 increments with above activities. Baseline: SEE OBJECTIVE DATA Goal status: INITIAL  PLAN:  PT FREQUENCY: 1x/week  PT DURATION:  90 days   PLANNED INTERVENTIONS: Therapeutic exercises, Therapeutic activity, Neuromuscular re-education, Balance training, Gait training, Patient/Family education, Self Care, Stair training, Vestibular training, Prosthetic training, DME instructions, Manual therapy, Re-evaluation, and Physical performance testng.  PLAN FOR NEXT SESSION:   instruct in proper sit to/from stand,  check on & update wear time of prosthesis, set up HEP for balance/upright posture/hip flexor stretch.    Jamey Reas, PT, DPT 02/07/2022, 12:51 PM

## 2022-02-07 ENCOUNTER — Ambulatory Visit (INDEPENDENT_AMBULATORY_CARE_PROVIDER_SITE_OTHER): Payer: Medicare Other | Admitting: Physical Therapy

## 2022-02-07 ENCOUNTER — Other Ambulatory Visit: Payer: Self-pay

## 2022-02-07 ENCOUNTER — Encounter: Payer: Self-pay | Admitting: Physical Therapy

## 2022-02-07 DIAGNOSIS — R2689 Other abnormalities of gait and mobility: Secondary | ICD-10-CM | POA: Diagnosis not present

## 2022-02-07 DIAGNOSIS — M5459 Other low back pain: Secondary | ICD-10-CM

## 2022-02-07 DIAGNOSIS — M6281 Muscle weakness (generalized): Secondary | ICD-10-CM | POA: Diagnosis not present

## 2022-02-07 DIAGNOSIS — M25652 Stiffness of left hip, not elsewhere classified: Secondary | ICD-10-CM | POA: Diagnosis not present

## 2022-02-07 DIAGNOSIS — R293 Abnormal posture: Secondary | ICD-10-CM

## 2022-02-07 DIAGNOSIS — R2681 Unsteadiness on feet: Secondary | ICD-10-CM | POA: Diagnosis not present

## 2022-02-07 DIAGNOSIS — Z9181 History of falling: Secondary | ICD-10-CM

## 2022-02-11 ENCOUNTER — Other Ambulatory Visit: Payer: Self-pay | Admitting: Family Medicine

## 2022-02-11 DIAGNOSIS — F3342 Major depressive disorder, recurrent, in full remission: Secondary | ICD-10-CM

## 2022-02-12 NOTE — Telephone Encounter (Signed)
Refill request for  Paxil 20 mg LR 01/25/21, #90, 3 rf LOV 01/01/22 FOV 04/03/22  Please review and advise.  Thanks. Dm/cma

## 2022-02-14 ENCOUNTER — Encounter: Payer: Medicare Other | Admitting: Physical Therapy

## 2022-02-15 ENCOUNTER — Other Ambulatory Visit: Payer: Self-pay | Admitting: *Deleted

## 2022-02-15 DIAGNOSIS — I739 Peripheral vascular disease, unspecified: Secondary | ICD-10-CM

## 2022-02-19 ENCOUNTER — Ambulatory Visit (INDEPENDENT_AMBULATORY_CARE_PROVIDER_SITE_OTHER): Payer: Medicare Other | Admitting: Physical Therapy

## 2022-02-19 ENCOUNTER — Encounter: Payer: Self-pay | Admitting: Physical Therapy

## 2022-02-19 DIAGNOSIS — R293 Abnormal posture: Secondary | ICD-10-CM

## 2022-02-19 DIAGNOSIS — M6281 Muscle weakness (generalized): Secondary | ICD-10-CM

## 2022-02-19 DIAGNOSIS — R2681 Unsteadiness on feet: Secondary | ICD-10-CM | POA: Diagnosis not present

## 2022-02-19 DIAGNOSIS — R2689 Other abnormalities of gait and mobility: Secondary | ICD-10-CM | POA: Diagnosis not present

## 2022-02-19 DIAGNOSIS — Z9181 History of falling: Secondary | ICD-10-CM

## 2022-02-19 DIAGNOSIS — M25652 Stiffness of left hip, not elsewhere classified: Secondary | ICD-10-CM

## 2022-02-19 NOTE — Therapy (Signed)
OUTPATIENT PHYSICAL THERAPY TREATMENT NOTE   Patient Name: [name] MRN: [id number] DOB:01-02-1941, 82 y.o., female Today's Date: 02/19/2022  PCP: [name], MD  REFERRING PROVIDER: [name], MD   END OF SESSION:   PT End of Session - 02/19/22 0936     Visit Number 2    Number of Visits 14    Date for PT Re-Evaluation 05/08/22    Authorization Type [employer] Medicare    Authorization Time Period no co-pay, no visit limit    Progress Note Due on Visit 10    PT Start Time 0932    PT Stop Time 1015    PT Time Calculation (min) 43 min    Equipment Utilized During Treatment Gait belt    Activity Tolerance Patient tolerated treatment well    Behavior During Therapy WFL for tasks assessed/performed             Past Medical History:  Diagnosis Date   Allergy    Anemia    childhood   Anxiety    Aortic stenosis    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   Arthritis    Breast cancer ([place]) 12/05/10   R breast, inv mammary, in situ,ER/PR +,HER2 -   Cancer ([place])    Carcinoma of breast treated with adjuvant chemotherapy ([place])    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[place])    Claudication ([place])    Coronary artery disease    stent 2007   Depression    Diabetes mellitus    Difficulty sleeping    Diverticulosis 12/10/03   Elevated cholesterol    Generalized weakness    GERD (gastroesophageal reflux disease)    Heart murmur    Hepatitis    as an infant   HSV-1 (herpes simplex virus 1) infection    Hyperlipidemia    Hypertension    Hypothyroidism    Osteoarthritis    PAD (peripheral artery disease) ([place])    Personal history of chemotherapy    Personal history of radiation therapy    Pneumonia    2014 september and March 2022   Rheumatoid arthritis ([place])    S/P aortic valve replacement with bioprosthetic valve 05/04/2020   [facility] Inspiris Resilia stented bovine pericardial tissue valve, size 23 mm   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   LIMA to D3, SVG to PDA,  EVH via right thigh   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   Dr [name]   Spinal stenosis    Thyroid disease    Past Surgical History:  Procedure Laterality Date   ABDOMINAL AORTOGRAM W/LOWER EXTREMITY Bilateral 04/29/2019   Procedure: ABDOMINAL AORTOGRAM W/LOWER EXTREMITY;  Surgeon: Marty Heck, MD;  Location: [facility];  Service: Cardiovascular;  Laterality: Bilateral;   ABDOMINAL AORTOGRAM W/LOWER EXTREMITY Left 07/01/2019   Procedure: ABDOMINAL AORTOGRAM W/ Left LOWER EXTREMITY [place];  Surgeon: Marty Heck, MD;  Location: [facility];  Service: Cardiovascular;  Laterality: Left;   ABDOMINAL AORTOGRAM W/LOWER EXTREMITY Left 03/03/2021   Procedure: ABDOMINAL AORTOGRAM W/LOWER EXTREMITY;  Surgeon: Cherre Robins, MD;  Location: [facility];  Service: Cardiovascular;  Laterality: Left;   ABDOMINAL AORTOGRAM W/LOWER EXTREMITY Right 06/12/2021   Procedure: ABDOMINAL AORTOGRAM W/LOWER EXTREMITY;  Surgeon: Waynetta Sandy, MD;  Location: [facility];  Service: Cardiovascular;  Laterality: Right;  LOWER LEG   AMPUTATION Left 03/06/2021   Procedure: LEFT ABOVE KNEE AMPUTATION;  Surgeon: Virl Cagey,  Carolann Littler, MD;  Location: [facility];  Service: Vascular;  Laterality: Left;   ANTERIOR AND POSTERIOR REPAIR N/A 04/05/2016   Procedure: ANTERIOR (CYSTOCELE) AND POSTERIOR REPAIR (RECTOCELE);  Surgeon: [name], MD;  Location: [facility];  Service: Gynecology;  Laterality: N/A;   AORTIC VALVE REPLACEMENT N/A 05/04/2020   Procedure: AORTIC VALVE REPLACEMENT (AVR) USING INSPIRIS 23MM RESILIA AORTIC VALVE;  Surgeon: Rexene Alberts, MD;  Location: [facility];  Service: Open Heart Surgery;  Laterality: N/A;   BREAST BIOPSY     BREAST LUMPECTOMY  1983   benign biopsy   BREAST LUMPECTOMY Right 12/29/2010   snbx, ER/PR +, Her2 -, 0/1 node pos.   CARPAL TUNNEL RELEASE Bilateral 9/12,8,12   CHOLECYSTECTOMY     COLONOSCOPY N/A 02/09/2013   Procedure: COLONOSCOPY;   Surgeon: [name], MD;  [facility];  Service: Endoscopy;  Laterality: N/A;   COLONOSCOPY     CORONARY ANGIOPLASTY     CORONARY ARTERY BYPASS GRAFT N/A 05/04/2020   Procedure: CORONARY ARTERY BYPASS GRAFTING (CABG), ON PUMP, TIMES TWO, USING LEFT INTERNAL MAMMARY ARTERY AND RIGHT ENDOSCOPICALLY HARVESTED GREATER SAPHENOUS VEIN;  Surgeon: Rexene Alberts, MD;  Location: [facility];  Service: Open Heart Surgery;  Laterality: N/A;   DILATION AND CURETTAGE OF UTERUS     ESOPHAGOGASTRODUODENOSCOPY N/A 02/09/2013   Procedure: ESOPHAGOGASTRODUODENOSCOPY (EGD);  Surgeon: [name], MD;  Location: [name];  Service: Endoscopy;  Laterality: N/A;   FOOT SPURS     HYSTEROSCOPY WITH D & C N/A 09/11/2012   Procedure: DILATATION AND CURETTAGE ;  Surgeon: [name], MD;  Location: [facility];  Service: Gynecology;  Laterality: N/A;   KNEE SURGERY  2007   LAPAROSCOPIC ASSISTED VAGINAL HYSTERECTOMY N/A 04/05/2016   Procedure: LAPAROSCOPIC ASSISTED VAGINAL HYSTERECTOMY possible BSO;  Surgeon: [name], MD;  Location: [facility];  Service: Gynecology;  Laterality: N/A;   LUMBAR LAMINECTOMY/DECOMPRESSION MICRODISCECTOMY N/A 10/20/2014   Procedure: MICRO LUMBER DECOMPRESSION L3-4 L4-5;  Surgeon: [name], MD;  [facility];  Service: Orthopedics;  Laterality: N/A;   LUMBAR LAMINECTOMY/DECOMPRESSION MICRODISCECTOMY Bilateral 10/13/2015   Procedure: MICRO LUMBAR DECOMPRESSION L5 - S1 AND REDO DECOMPRESSION L4 - L5 AND REMOVAL OF FACET CYST L4 - L5 2 LEVELS;  Surgeon: [name], MD;  [facility];  Service: Orthopedics;  Laterality: Bilateral;   PERIPHERAL VASCULAR ATHERECTOMY Right 06/12/2021   Procedure: PERIPHERAL VASCULAR ATHERECTOMY;  Surgeon: Waynetta Sandy, MD;  Location: [facility];  Service: Cardiovascular;  Laterality: Right;  SFA/POP/AT   PERIPHERAL VASCULAR INTERVENTION Right 04/29/2019   Procedure: PERIPHERAL VASCULAR INTERVENTION;  Surgeon: Marty Heck, MD;  Location: [facility];  Service: Cardiovascular;  Laterality: Right;   PERIPHERAL VASCULAR INTERVENTION Right 06/12/2021   Procedure: PERIPHERAL VASCULAR INTERVENTION;  Surgeon: Waynetta Sandy, MD;  Location: [facility];  Service: Cardiovascular;  Laterality: Right;  SFP/POPLITEAL/AT   PORT-A-CATH REMOVAL  04/17/2011   Procedure: REMOVAL PORT-A-CATH;  Surgeon: [name], MD;  Location: [facility];  Service: General;  Laterality: Left;   PORTACATH PLACEMENT  02/07/2011   Procedure: INSERTION PORT-A-CATH;  Surgeon: [name], MD;  [facility];  Service: General;  Laterality: N/A;   RIGHT/LEFT HEART CATH AND CORONARY ANGIOGRAPHY N/A 03/17/2020   Procedure: RIGHT/LEFT HEART CATH AND CORONARY ANGIOGRAPHY;  Surgeon: Burnell Blanks, MD;  Location: [facility];  Service: Cardiovascular;  Laterality: N/A;   TEE WITHOUT CARDIOVERSION N/A 05/04/2020   Procedure:  TRANSESOPHAGEAL ECHOCARDIOGRAM (TEE);  Surgeon: Rexene Alberts, MD;  Location: [facility];  Service: Open Heart Surgery;  Laterality: N/A;   UPPER GASTROINTESTINAL ENDOSCOPY     Patient Active Problem List   Diagnosis Date Noted   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[place]) 03/13/2021   Sinus tachycardia 03/11/2021   Elevated troponin 03/08/2021   Stroke-like symptoms 03/08/2021   Anemia of chronic disease 03/08/2021   Acute supraglottitis with epiglottitis 02/05/2021   Epiglottitis 02/05/2021   Reactive airway disease 01/20/2021   Diabetic peripheral neuropathy ([place]) 12/19/2020   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   Primary insomnia 11/02/2020   Irritation of eyelid 07/26/2020   S/P aortic valve replacement with bioprosthetic valve 05/04/2020   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   Peripheral artery disease ([facility])    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[place]) 04/07/2019   Low back pain 02/03/2019   Scoliosis deformity of spine  02/03/2019   Osteoarthritis of hip 11/17/2018   Claudication ([place])    Chronic headaches 11/12/2017   Degeneration of lumbar intervertebral disc 06/13/2017   Hyperkalemia 06/03/2017   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[place])    Mass of left adrenal gland ([place]) 04/19/2017   Spinal stenosis of lumbar region 10/20/2014   Hyperlipidemia 03/16/2013   Dense breasts 12/22/2012   Anxiety state 04/16/2012   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/16/2012   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   Osteoarthritis of knee 04/16/2012   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 female breast ([place]) 12/20/2010   History of breast cancer 12/05/2010   Depression 09/14/2008   GERD 09/14/2008   Type 2 diabetes mellitus with neurologic complication, with long-term current use of insulin ([place]) 05/20/2008   Coronary atherosclerosis 04/17/2007   Hypothyroidism 04/03/2007    REFERRING DIAG: W09.811B (ICD-10-CM) - Above knee amputation of left lower extremity   ONSET DATE: 02/02/2022 MD referral to PT   THERAPY DIAG:  Other abnormalities of gait and mobility  Unsteadiness on feet  Muscle weakness (generalized)  Stiffness of left hip, not elsewhere classified  Abnormal posture  History of falling  Rationale for Evaluation and Treatment Rehabilitation  PERTINENT HISTORY: Left TFA, DM2, neuropathy, PVD, CKD st4, RLE stent, aortic stenosis, CAD, CABG X 2, HTN, OA, lumbar spinal stenosis, breast CA   PRECAUTIONS: Fall  SUBJECTIVE:                                                                                                                                                                                      SUBJECTIVE STATEMENT:  she denies falls. She reports wearing prosthesis from arising to  bedtime.  PAIN:  Are you having pain? Yes: NPRS scale: today 1/10 and in last week 4/10 Pain location: right lumbar  Pain description: achy Aggravating factors: walking without  shoes with rollator Relieving factors: sit down   OBJECTIVE: (objective measures completed at initial evaluation unless otherwise dated)   COGNITION: Overall cognitive status: Within functional limits for tasks assessed   MUSCLE LENGTH: Hamstrings:  Thomas test: Left -37 deg   POSTURE: rounded shoulders, forward head, increased lumbar lordosis, increased thoracic kyphosis, flexed trunk , and weight shift right   LOWER EXTREMITY ROM:   ROM P:passive  A:active Right eval Left eval  Hip flexion      Hip extension Standing -27* excessive lordosis    Hip abduction      Hip adduction      Hip internal rotation      Hip external rotation      Knee flexion      Knee extension      Ankle dorsiflexion      Ankle plantarflexion      Ankle inversion      Ankle eversion       (Blank rows = not tested)   LOWER EXTREMITY MMT:   MMT Right eval Left eval  Hip flexion      Hip extension      Hip abduction      Hip adduction      Hip internal rotation      Hip external rotation      Knee flexion      Knee extension      Ankle dorsiflexion      Ankle plantarflexion      Ankle inversion      Ankle eversion      (Blank rows = not tested)   TRANSFERS: Sit to stand: SBA requires UE assist on armrest, prosthetic knee locked delayed in process impairing stabilization upon arising. Uses back of legs against chair & external support to stabilize Stand to sit: SBA uses back of legs against chair for stability. External support required & armrests on chair.    GAIT: Gait pattern: step to pattern, decreased arm swing- Left, decreased step length- Right, decreased stance time- Left, circumduction- Left, Left hip hike, antalgic, lateral hip instability, trunk flexed, and abducted- Left Distance walked: 100' Assistive device utilized: Lobbyist with HHA (required) and Walker - 4 wheeled Level of assistance: Mod A with SBQC / HHA and SBA / verbal cues for rollator Gait  velocity: 1.22 ft/sec with rollator walker   FUNCTIONAL TESTs:  Eval (02/07/2022): Timed up and go (TUG): 28.97 sec with rollator Berg Balance Scale: 21/56   [facility] PT Assessment - 02/07/22 1100                Standardized Balance Assessment    Standardized Balance Assessment Berg Balance Test;Timed Up and Go Test          Berg Balance Test    Sit to Stand Able to stand  independently using hands     Standing Unsupported Able to stand safely 2 minutes     Sitting with Back Unsupported but Feet Supported on Floor or Stool Able to sit safely and securely 2 minutes     Stand to Sit Controls descent by using hands     Transfers Able to transfer safely, definite need of hands     Standing Unsupported with Eyes Closed Able to stand 3 seconds     Standing Unsupported  with Feet Together Needs help to attain position but able to stand for 30 seconds with feet together     From Standing, Reach Forward with Outstretched Arm Reaches forward but needs supervision     From Standing Position, Pick up Object from Floor Unable to try/needs assist to keep balance     From Standing Position, Turn to Look Behind Over each Shoulder Needs assist to keep from losing balance and falling     Turn 360 Degrees Needs assistance while turning     Standing Unsupported, Alternately Place Feet on Step/Stool Needs assistance to keep from falling or unable to try     Standing Unsupported, One Foot in [employer] balance while stepping or standing     Standing on One Leg Unable to try or needs assist to prevent fall     Total Score 21     Berg comment: < 36 high risk for falls (close to 100%) 46-51 moderate (>50%)   37-45 significant (>80%) 52-55 lower (> 25%)          Timed Up and Go Test    Normal TUG (seconds) 28.97   rollator walker & prosthesis    TUG Comments TUG:  Normal: >13.5 sec indicates high fall risk  Manual: > 14.5 sec indicates high fall risk  Cognitive: >15 sec indicates high fall risk                    CURRENT PROSTHETIC WEAR ASSESSMENT:  Patient is independent with: skin check, residual limb care, and prosthetic cleaning Patient is dependent with: correct ply sock adjustment, proper wear schedule/adjustment, and proper weight-bearing schedule/adjustment Donning prosthesis: Modified independence per pt report Doffing prosthesis: Modified independence per pt report Prosthetic wear tolerance: ~8 hours/day, 7 days/week (~60-70% of awake hours) Prosthetic weight bearing tolerance: 8 minutes with no c/o limb pain Residual limb condition: pt reports no issues Prosthetic description: silicon liner with suction ring suspension, ischial containment socket with flexible inner socket, non-hydraulic knee,        TODAY'S TREATMENT:                                                                                                                             DATE:  02/19/2022: Prosthetic training with Transfemoral Prosthesis: C/o headache BP seated 161/89 HR 74  reports took meds this morning. PT demo & verbal cues on sit to/from stand engaging prosthesis & technique for balance / decreasing UE support.  Pt performed 10 reps from chair using seat & progressed to light touch on chair back anteriorly.   PT assessed gait with rollator walker: pt sit to stand with verbal cues to carryover above but safe with rollator.  Pt amb 30' with safe technique.  Stand to sit pt needed PT instruction for brake management and engaging prosthesis as above.   Pt amb 50' X 2 with SBQC with modA / HHA.  PT verbal & demo cues on  Stationary stance with prosthetic foot positioning to maintain extension for stability.   Therapeutic Exercise: Hip flexor stretch LLE hooklying with prosthesis over edge 30 sec hold 2 reps.    02/07/2022: Prosthetic care education   PATIENT EDUCATION: PATIENT EDUCATED ON FOLLOWING PROSTHETIC CARE: Education details: sitting positioning to decrease back pain, when prosthesis is off higher fall  risk & RLE stress and Proper wear schedule/adjustment PT recommending to donne upon arising, doffe for evening bath (washing liner) and redonne after bath with other liner until bedtime.  Person educated: Patient Education method: Explanation, Demonstration, and Verbal cues Education comprehension: verbalized understanding, verbal cues required, tactile cues required, and needs further education   HOME EXERCISE PROGRAM: Access Code: [email or web address] URL: [email or web address] Date: 02/19/2022 Prepared by: Jamey Reas  Exercises - sit to stand to sit with counter  - 2-3 x daily - 7 x weekly - 1 sets - 10 reps - 5 seconds hold - Modified Thomas Stretch  - 2-3 x daily - 7 x weekly - 1 sets - 2-3 reps - 20-30 seconds hold   ASSESSMENT:   CLINICAL IMPRESSION: PT instructed in sit to/from stand technique engaging prosthesis which she improved when doing exercise but needed verbal cues for carryover into routine activity.    OBJECTIVE IMPAIRMENTS: Abnormal gait, decreased activity tolerance, decreased balance, decreased endurance, decreased knowledge of condition, decreased knowledge of use of DME, decreased mobility, difficulty walking, decreased ROM, decreased strength, impaired flexibility, postural dysfunction, prosthetic dependency , and pain.    ACTIVITY LIMITATIONS: carrying, lifting, bending, sitting, standing, sleeping, stairs, transfers, and locomotion level   PARTICIPATION LIMITATIONS: meal prep, cleaning, laundry, community activity, and yard work   PERSONAL FACTORS: Age, Fitness, Past/current experiences, Time since onset of injury/illness/exacerbation, and 3+ comorbidities: see PMH  are also affecting patient's functional outcome.    REHAB POTENTIAL: Good   CLINICAL DECISION MAKING: Evolving/moderate complexity   EVALUATION COMPLEXITY: Moderate     GOALS: Goals reviewed with patient? Yes   SHORT TERM GOALS: Target date: 03/12/2022   Patient verbalizes &  demonstrates understanding of HEP. Baseline: SEE OBJECTIVE DATA Goal status: INITIAL 2.  Patient reports donning prosthesis within 90 min of arising in mornings.  Baseline: SEE OBJECTIVE DATA Goal status: INITIAL   3.  Patient sit to/from stand from chairs without armrests using UEs & stabilizes correctly. Baseline: SEE OBJECTIVE DATA Goal status: INITIAL   4. Patient ambulates 64' with [facility] & prosthesis with minA. Baseline: SEE OBJECTIVE DATA Goal status: INITIAL     LONG TERM GOALS: Target date: 05/08/2022   Patient demonstrates & verbalized understanding of prosthetic care to enable safe utilization of prosthesis. Baseline: SEE OBJECTIVE DATA Goal status: INITIAL   Patient tolerates prosthesis wear >90% of awake hours without skin or limb pain issues. Baseline: SEE OBJECTIVE DATA Goal status: INITIAL   Berg Balance >/= 36/56 to indicate lower fall risk Baseline: SEE OBJECTIVE DATA Goal status: INITIAL   Patient ambulates >200' with prosthesis & SBQC modified independently Baseline: SEE OBJECTIVE DATA Goal status: INITIAL   Patient negotiates ramps, curbs & stairs with single rail with prosthesis & SBQC with family safely.  Baseline: SEE OBJECTIVE DATA Goal status: INITIAL   Patient verbalizes & demonstrates understanding of ongoing HEP Baseline: SEE OBJECTIVE DATA Goal status: INITIAL   7.  Patient reports low back pain increases </= 2 increments with above activities. Baseline: SEE OBJECTIVE DATA Goal status: INITIAL   PLAN:   PT FREQUENCY: 1x/week   PT DURATION:  90 days  PLANNED INTERVENTIONS: Therapeutic exercises, Therapeutic activity, Neuromuscular re-education, Balance training, Gait training, Patient/Family education, Self Care, Stair training, Vestibular training, Prosthetic training, DME instructions, Manual therapy, Re-evaluation, and Physical performance testng.   PLAN FOR NEXT SESSION:  check rollator use on ramp/curb,  check sit to/from stand &  hip flexor stretch,  set up HEP for balance/upright posture.    Jamey Reas, PT, DPT 02/19/2022, 12:47 PM

## 2022-02-26 ENCOUNTER — Ambulatory Visit (INDEPENDENT_AMBULATORY_CARE_PROVIDER_SITE_OTHER): Payer: Medicare Other | Admitting: Physical Therapy

## 2022-02-26 ENCOUNTER — Encounter: Payer: Self-pay | Admitting: Physical Therapy

## 2022-02-26 DIAGNOSIS — R2681 Unsteadiness on feet: Secondary | ICD-10-CM

## 2022-02-26 DIAGNOSIS — R2689 Other abnormalities of gait and mobility: Secondary | ICD-10-CM | POA: Diagnosis not present

## 2022-02-26 DIAGNOSIS — M5459 Other low back pain: Secondary | ICD-10-CM

## 2022-02-26 DIAGNOSIS — M6281 Muscle weakness (generalized): Secondary | ICD-10-CM | POA: Diagnosis not present

## 2022-02-26 DIAGNOSIS — Z9181 History of falling: Secondary | ICD-10-CM

## 2022-02-26 DIAGNOSIS — M25652 Stiffness of left hip, not elsewhere classified: Secondary | ICD-10-CM | POA: Diagnosis not present

## 2022-02-26 DIAGNOSIS — R293 Abnormal posture: Secondary | ICD-10-CM

## 2022-02-26 NOTE — Patient Instructions (Addendum)
Stand in front of sink with one chair back on each side of you and 3rd chair behind you:  Your prosthetic toes should be back ~1" to your right toes.  Both feet facing forward about hip width apart.   Turn in circles using cane.  When going to left lead with left leg.  When going to right, lead with right leg.  Reach both hands over head and return both hands to counter.  Follow your hands with your eyes.  Keep weight on both legs.  Turn your upper body to place item with both hands on back of chair to each side. Stop at counter between directions.  Follow your hands with your eyes.  Keep weight on both legs. Lean to each side placing item in chair bottom with one hand (use right hand going to right & left hand going to left).  Follow your hand with your eyes.  Turn around you are facing chair that was behind you.  Make sure that your back side does not touch counter when you bend forward.  Lean forward to place an item in chair seat then stand back up straight.   Stand up & sit down using chair seat to push up. Goal is to not touch sink unless off balance.   standing Up sit at edge of chair with right foot near chair, left foot /prosthesis with heel touching. push forward to SMELL flower,  keep prosthetic foot in contact with floor.  then straight up back.  Sitting down: stand with right foot near chair & left prosthetic foot forward a little bow to SMELL to flower unlocking prosthetic knee reach with LEFT hand first to sit.    Sit at edge of chair with both feet touching & with back up straight. Move head 10 times with eyes open then 10 times eyes closed. Head motion is important.  Side to side.  Up tilting head back & down chin to chest. Diagonal up to right then down to left.  Diagonal up to left then down to right.

## 2022-02-26 NOTE — Therapy (Signed)
OUTPATIENT PHYSICAL THERAPY TREATMENT NOTE   Patient Name: [name] MRN: [id number] DOB:1940-02-21, 82 y.o., female Today's Date: 02/26/2022  PCP: [name], MD  REFERRING PROVIDER: [name], MD   END OF SESSION:   PT End of Session - 02/26/22 0926     Visit Number 3    Number of Visits 14    Date for PT Re-Evaluation 05/08/22    Authorization Type [employer] Medicare    Authorization Time Period no co-pay, no visit limit    Progress Note Due on Visit 10    PT Start Time 0926    PT Stop Time 1013    PT Time Calculation (min) 47 min    Equipment Utilized During Treatment Gait belt    Activity Tolerance Patient tolerated treatment well    Behavior During Therapy WFL for tasks assessed/performed              Past Medical History:  Diagnosis Date   Allergy    Anemia    childhood   Anxiety    Aortic stenosis    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   Arthritis    Breast cancer ([place]) 12/05/10   R breast, inv mammary, in situ,ER/PR +,HER2 -   Cancer ([place])    Carcinoma of breast treated with adjuvant chemotherapy ([place])    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[place])    Claudication ([place])    Coronary artery disease    stent 2007   Depression    Diabetes mellitus    Difficulty sleeping    Diverticulosis 12/10/03   Elevated cholesterol    Generalized weakness    GERD (gastroesophageal reflux disease)    Heart murmur    Hepatitis    as an infant   HSV-1 (herpes simplex virus 1) infection    Hyperlipidemia    Hypertension    Hypothyroidism    Osteoarthritis    PAD (peripheral artery disease) ([place])    Personal history of chemotherapy    Personal history of radiation therapy    Pneumonia    2014 september and March 2022   Rheumatoid arthritis ([place])    S/P aortic valve replacement with bioprosthetic valve 05/04/2020   [facility] Inspiris Resilia stented bovine pericardial tissue valve, size 23 mm   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   LIMA to D3, SVG to PDA,  EVH via right thigh   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   Dr [name]   Spinal stenosis    Thyroid disease    Past Surgical History:  Procedure Laterality Date   ABDOMINAL AORTOGRAM W/LOWER EXTREMITY Bilateral 04/29/2019   Procedure: ABDOMINAL AORTOGRAM W/LOWER EXTREMITY;  Surgeon: Marty Heck, MD;  Location: [facility];  Service: Cardiovascular;  Laterality: Bilateral;   ABDOMINAL AORTOGRAM W/LOWER EXTREMITY Left 07/01/2019   Procedure: ABDOMINAL AORTOGRAM W/ Left LOWER EXTREMITY [place];  Surgeon: Marty Heck, MD;  Location: [facility];  Service: Cardiovascular;  Laterality: Left;   ABDOMINAL AORTOGRAM W/LOWER EXTREMITY Left 03/03/2021   Procedure: ABDOMINAL AORTOGRAM W/LOWER EXTREMITY;  Surgeon: Cherre Robins, MD;  Location: [facility];  Service: Cardiovascular;  Laterality: Left;   ABDOMINAL AORTOGRAM W/LOWER EXTREMITY Right 06/12/2021   Procedure: ABDOMINAL AORTOGRAM W/LOWER EXTREMITY;  Surgeon: Waynetta Sandy, MD;  Location: [facility];  Service: Cardiovascular;  Laterality: Right;  LOWER LEG   AMPUTATION Left 03/06/2021   Procedure: LEFT ABOVE KNEE AMPUTATION;  Surgeon:  [name], MD;  Location: [facility];  Service: Vascular;  Laterality: Left;   ANTERIOR AND POSTERIOR REPAIR N/A 04/05/2016   Procedure: ANTERIOR (CYSTOCELE) AND POSTERIOR REPAIR (RECTOCELE);  Surgeon: [name], MD;  Location: [facility];  Service: Gynecology;  Laterality: N/A;   AORTIC VALVE REPLACEMENT N/A 05/04/2020   Procedure: AORTIC VALVE REPLACEMENT (AVR) USING INSPIRIS 23MM RESILIA AORTIC VALVE;  Surgeon: Rexene Alberts, MD;  Location: [facility];  Service: Open Heart Surgery;  Laterality: N/A;   BREAST BIOPSY     BREAST LUMPECTOMY  1983   benign biopsy   BREAST LUMPECTOMY Right 12/29/2010   snbx, ER/PR +, Her2 -, 0/1 node pos.   CARPAL TUNNEL RELEASE Bilateral 9/12,8,12   CHOLECYSTECTOMY     COLONOSCOPY N/A 02/09/2013   Procedure: COLONOSCOPY;   Surgeon: [name], MD;  [facility];  Service: Endoscopy;  Laterality: N/A;   COLONOSCOPY     CORONARY ANGIOPLASTY     CORONARY ARTERY BYPASS GRAFT N/A 05/04/2020   Procedure: CORONARY ARTERY BYPASS GRAFTING (CABG), ON PUMP, TIMES TWO, USING LEFT INTERNAL MAMMARY ARTERY AND RIGHT ENDOSCOPICALLY HARVESTED GREATER SAPHENOUS VEIN;  Surgeon: Rexene Alberts, MD;  Location: [facility];  Service: Open Heart Surgery;  Laterality: N/A;   DILATION AND CURETTAGE OF UTERUS     ESOPHAGOGASTRODUODENOSCOPY N/A 02/09/2013   Procedure: ESOPHAGOGASTRODUODENOSCOPY (EGD);  Surgeon: [name], MD;  Location: [name];  Service: Endoscopy;  Laterality: N/A;   FOOT SPURS     HYSTEROSCOPY WITH D & C N/A 09/11/2012   Procedure: DILATATION AND CURETTAGE ;  Surgeon: [name], MD;  Location: [facility];  Service: Gynecology;  Laterality: N/A;   KNEE SURGERY  2007   LAPAROSCOPIC ASSISTED VAGINAL HYSTERECTOMY N/A 04/05/2016   Procedure: LAPAROSCOPIC ASSISTED VAGINAL HYSTERECTOMY possible BSO;  Surgeon: [name], MD;  Location: [facility];  Service: Gynecology;  Laterality: N/A;   LUMBAR LAMINECTOMY/DECOMPRESSION MICRODISCECTOMY N/A 10/20/2014   Procedure: MICRO LUMBER DECOMPRESSION L3-4 L4-5;  Surgeon: [name], MD;  [facility];  Service: Orthopedics;  Laterality: N/A;   LUMBAR LAMINECTOMY/DECOMPRESSION MICRODISCECTOMY Bilateral 10/13/2015   Procedure: MICRO LUMBAR DECOMPRESSION L5 - S1 AND REDO DECOMPRESSION L4 - L5 AND REMOVAL OF FACET CYST L4 - L5 2 LEVELS;  Surgeon: [name], MD;  [facility];  Service: Orthopedics;  Laterality: Bilateral;   PERIPHERAL VASCULAR ATHERECTOMY Right 06/12/2021   Procedure: PERIPHERAL VASCULAR ATHERECTOMY;  Surgeon: Waynetta Sandy, MD;  Location: [facility];  Service: Cardiovascular;  Laterality: Right;  SFA/POP/AT   PERIPHERAL VASCULAR INTERVENTION Right 04/29/2019   Procedure: PERIPHERAL VASCULAR INTERVENTION;  Surgeon: Marty Heck, MD;  Location: [facility];  Service: Cardiovascular;  Laterality: Right;   PERIPHERAL VASCULAR INTERVENTION Right 06/12/2021   Procedure: PERIPHERAL VASCULAR INTERVENTION;  Surgeon: Waynetta Sandy, MD;  Location: [facility];  Service: Cardiovascular;  Laterality: Right;  SFP/POPLITEAL/AT   PORT-A-CATH REMOVAL  04/17/2011   Procedure: REMOVAL PORT-A-CATH;  Surgeon: [name], MD;  Location: [facility];  Service: General;  Laterality: Left;   PORTACATH PLACEMENT  02/07/2011   Procedure: INSERTION PORT-A-CATH;  Surgeon: [name], MD;  [facility];  Service: General;  Laterality: N/A;   RIGHT/LEFT HEART CATH AND CORONARY ANGIOGRAPHY N/A 03/17/2020   Procedure: RIGHT/LEFT HEART CATH AND CORONARY ANGIOGRAPHY;  Surgeon: Burnell Blanks, MD;  Location: [facility];  Service: Cardiovascular;  Laterality: N/A;   TEE WITHOUT CARDIOVERSION N/A 05/04/2020  Procedure: TRANSESOPHAGEAL ECHOCARDIOGRAM (TEE);  Surgeon: Rexene Alberts, MD;  Location: [facility];  Service: Open Heart Surgery;  Laterality: N/A;   UPPER GASTROINTESTINAL ENDOSCOPY     Patient Active Problem List   Diagnosis Date Noted   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[place]) 03/13/2021   Sinus tachycardia 03/11/2021   Elevated troponin 03/08/2021   Stroke-like symptoms 03/08/2021   Anemia of chronic disease 03/08/2021   Acute supraglottitis with epiglottitis 02/05/2021   Epiglottitis 02/05/2021   Reactive airway disease 01/20/2021   Diabetic peripheral neuropathy ([place]) 12/19/2020   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   Primary insomnia 11/02/2020   Irritation of eyelid 07/26/2020   S/P aortic valve replacement with bioprosthetic valve 05/04/2020   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   Peripheral artery disease ([facility])    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[place]) 04/07/2019   Low back pain 02/03/2019   Scoliosis deformity of spine  02/03/2019   Osteoarthritis of hip 11/17/2018   Claudication ([place])    Chronic headaches 11/12/2017   Degeneration of lumbar intervertebral disc 06/13/2017   Hyperkalemia 06/03/2017   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[place])    Mass of left adrenal gland ([place]) 04/19/2017   Spinal stenosis of lumbar region 10/20/2014   Hyperlipidemia 03/16/2013   Dense breasts 12/22/2012   Anxiety state 04/16/2012   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/16/2012   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   Osteoarthritis of knee 04/16/2012   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 female breast ([place]) 12/20/2010   History of breast cancer 12/05/2010   Depression 09/14/2008   GERD 09/14/2008   Type 2 diabetes mellitus with neurologic complication, with long-term current use of insulin ([place]) 05/20/2008   Coronary atherosclerosis 04/17/2007   Hypothyroidism 04/03/2007    REFERRING DIAG: J19.417E (ICD-10-CM) - Above knee amputation of left lower extremity   ONSET DATE: 02/02/2022 MD referral to PT   THERAPY DIAG:  Other abnormalities of gait and mobility  Unsteadiness on feet  Muscle weakness (generalized)  Stiffness of left hip, not elsewhere classified  Abnormal posture  History of falling  Other low back pain  Rationale for Evaluation and Treatment Rehabilitation  PERTINENT HISTORY: Left TFA, DM2, neuropathy, PVD, CKD st4, RLE stent, aortic stenosis, CAD, CABG X 2, HTN, OA, lumbar spinal stenosis, breast CA   PRECAUTIONS: Fall  SUBJECTIVE:                                                                                                                                                                                      SUBJECTIVE STATEMENT:  She has been struggling  with head cold.  She did her exercises daily but back off Saturday & Sunday due to head cold.    PAIN:  Are you having pain? Yes: NPRS scale: today   1/10 and in last week 4/10 Pain location:  right lumbar  Pain description: achy Aggravating factors: walking without shoes with rollator Relieving factors: sit down   OBJECTIVE: (objective measures completed at initial evaluation unless otherwise dated)   COGNITION: Overall cognitive status: Within functional limits for tasks assessed   MUSCLE LENGTH: Hamstrings:  Thomas test: Left -37 deg   POSTURE: rounded shoulders, forward head, increased lumbar lordosis, increased thoracic kyphosis, flexed trunk , and weight shift right   LOWER EXTREMITY ROM:   ROM P:passive  A:active Right eval Left eval  Hip flexion      Hip extension Standing -27* excessive lordosis    Hip abduction      Hip adduction      Hip internal rotation      Hip external rotation      Knee flexion      Knee extension      Ankle dorsiflexion      Ankle plantarflexion      Ankle inversion      Ankle eversion       (Blank rows = not tested)   LOWER EXTREMITY MMT:   MMT Right eval Left eval  Hip flexion      Hip extension      Hip abduction      Hip adduction      Hip internal rotation      Hip external rotation      Knee flexion      Knee extension      Ankle dorsiflexion      Ankle plantarflexion      Ankle inversion      Ankle eversion      (Blank rows = not tested)   TRANSFERS: Sit to stand: SBA requires UE assist on armrest, prosthetic knee locked delayed in process impairing stabilization upon arising. Uses back of legs against chair & external support to stabilize Stand to sit: SBA uses back of legs against chair for stability. External support required & armrests on chair.    GAIT: Gait pattern: step to pattern, decreased arm swing- Left, decreased step length- Right, decreased stance time- Left, circumduction- Left, Left hip hike, antalgic, lateral hip instability, trunk flexed, and abducted- Left Distance walked: 100' Assistive device utilized: Lobbyist with HHA (required) and Walker - 4 wheeled Level of  assistance: Mod A with SBQC / HHA and SBA / verbal cues for rollator Gait velocity: 1.22 ft/sec with rollator walker   FUNCTIONAL TESTs:  Eval (02/07/2022): Timed up and go (TUG): 28.97 sec with rollator Berg Balance Scale: 21/56   [facility] PT Assessment - 02/07/22 1100                Standardized Balance Assessment    Standardized Balance Assessment Berg Balance Test;Timed Up and Go Test          Berg Balance Test    Sit to Stand Able to stand  independently using hands     Standing Unsupported Able to stand safely 2 minutes     Sitting with Back Unsupported but Feet Supported on Floor or Stool Able to sit safely and securely 2 minutes     Stand to Sit Controls descent by using hands     Transfers Able to transfer safely, definite need  of hands     Standing Unsupported with Eyes Closed Able to stand 3 seconds     Standing Unsupported with Feet Together Needs help to attain position but able to stand for 30 seconds with feet together     From Standing, Reach Forward with Outstretched Arm Reaches forward but needs supervision     From Standing Position, Pick up Object from Floor Unable to try/needs assist to keep balance     From Standing Position, Turn to Look Behind Over each Shoulder Needs assist to keep from losing balance and falling     Turn 360 Degrees Needs assistance while turning     Standing Unsupported, Alternately Place Feet on Step/Stool Needs assistance to keep from falling or unable to try     Standing Unsupported, One Foot in [employer] balance while stepping or standing     Standing on One Leg Unable to try or needs assist to prevent fall     Total Score 21     Berg comment: < 36 high risk for falls (close to 100%) 46-51 moderate (>50%)   37-45 significant (>80%) 52-55 lower (> 25%)          Timed Up and Go Test    Normal TUG (seconds) 28.97   rollator walker & prosthesis    TUG Comments TUG:  Normal: >13.5 sec indicates high fall risk  Manual: > 14.5 sec indicates high  fall risk  Cognitive: >15 sec indicates high fall risk                   CURRENT PROSTHETIC WEAR ASSESSMENT:  Patient is independent with: skin check, residual limb care, and prosthetic cleaning Patient is dependent with: correct ply sock adjustment, proper wear schedule/adjustment, and proper weight-bearing schedule/adjustment Donning prosthesis: Modified independence per pt report Doffing prosthesis: Modified independence per pt report Prosthetic wear tolerance: ~8 hours/day, 7 days/week (~60-70% of awake hours) Prosthetic weight bearing tolerance: 8 minutes with no c/o limb pain Residual limb condition: pt reports no issues Prosthetic description: silicon liner with suction ring suspension, ischial containment socket with flexible inner socket, non-hydraulic knee,        TODAY'S TREATMENT:                                                                                                                             DATE:  02/26/2022: Prosthetic Training with Transfemoral prosthesis: Pt amb 50' X 3 with SBQC & HHA / modA.  PT verbal cues ea time for proper sit to stand technique engaging prosthesis.   Neuromuscular Re-ed:  HEP working on balance. Stand in front of sink with one chair back on each side of you and 3rd chair behind you:  Your prosthetic toes should be back ~1" to your right toes.  Both feet facing forward about hip width apart.   Turn in circles using cane.  When going to left lead with left leg.  When going  to right, lead with right leg.  Reach both hands over head and return both hands to counter.  Follow your hands with your eyes.  Keep weight on both legs.  Turn your upper body to place item with both hands on back of chair to each side. Stop at counter between directions.  Follow your hands with your eyes.  Keep weight on both legs. Lean to each side placing item in chair bottom with one hand (use right hand going to right & left hand going to left).  Follow your hand  with your eyes.  Turn around you are facing chair that was behind you.  Make sure that your back side does not touch counter when you bend forward.  Lean forward to place an item in chair seat then stand back up straight.   Stand up & sit down using chair seat to push up. Goal is to not touch sink unless off balance.   standing Up sit at edge of chair with right foot near chair, left foot /prosthesis with heel touching. push forward to SMELL flower,  keep prosthetic foot in contact with floor.  then straight up back.  Sitting down: stand with right foot near chair & left prosthetic foot forward a little bow to SMELL to flower unlocking prosthetic knee reach with LEFT hand first to sit.    Sit at edge of chair with both feet touching & with back up straight. Move head 10 times with eyes open then 10 times eyes closed. Head motion is important.  Side to side.  Up tilting head back & down chin to chest. Diagonal up to right then down to left.  Diagonal up to left then down to right.  02/19/2022: Prosthetic training with Transfemoral Prosthesis: C/o headache BP seated 161/89 HR 74  reports took meds this morning. PT demo & verbal cues on sit to/from stand engaging prosthesis & technique for balance / decreasing UE support.  Pt performed 10 reps from chair using seat & progressed to light touch on chair back anteriorly.   PT assessed gait with rollator walker: pt sit to stand with verbal cues to carryover above but safe with rollator.  Pt amb 30' with safe technique.  Stand to sit pt needed PT instruction for brake management and engaging prosthesis as above.   Pt amb 50' X 2 with SBQC with modA / HHA.  PT verbal & demo cues on Stationary stance with prosthetic foot positioning to maintain extension for stability.   Therapeutic Exercise: Hip flexor stretch LLE hooklying with prosthesis over edge 30 sec hold 2 reps.    02/07/2022: Prosthetic care education   PATIENT EDUCATION: PATIENT  EDUCATED ON FOLLOWING PROSTHETIC CARE: Education details: sitting positioning to decrease back pain, when prosthesis is off higher fall risk & RLE stress and Proper wear schedule/adjustment PT recommending to donne upon arising, doffe for evening bath (washing liner) and redonne after bath with other liner until bedtime.  Person educated: Patient Education method: Explanation, Demonstration, and Verbal cues Education comprehension: verbalized understanding, verbal cues required, tactile cues required, and needs further education   HOME EXERCISE PROGRAM: Access Code: [email or web address] URL: [email or web address] Date: 02/19/2022 Prepared by: Jamey Reas  Exercises - sit to stand to sit with counter  - 2-3 x daily - 7 x weekly - 1 sets - 10 reps - 5 seconds hold - Modified Thomas Stretch  - 2-3 x daily - 7 x weekly - 1 sets - 2-3 reps - 20-30 seconds hold  Stand in front of sink with one chair back on each side of you and 3rd chair behind you:  Your prosthetic toes should be back ~1" to your right toes.  Both feet facing forward about hip width apart.   Turn in circles using cane.  When going to left lead with left leg.  When going to right, lead with right leg.  Reach both hands over head and return both hands to counter.  Follow your hands with your eyes.  Keep weight on both legs.  Turn your upper body to place item with both hands on back of chair to each side. Stop at counter between directions.  Follow your hands with your eyes.  Keep weight on both legs. Lean to each side placing item in chair bottom with one hand (use right hand going to right & left hand going to left).  Follow your hand with your eyes.  Turn around you are facing chair that was behind you.  Make sure that your back side does not touch counter when you bend forward.  Lean forward to place an item in chair seat then stand back up straight.   Stand up & sit down using chair seat to push up. Goal is to not touch sink  unless off balance.   standing Up sit at edge of chair with right foot near chair, left foot /prosthesis with heel touching. push forward to SMELL flower,  keep prosthetic foot in contact with floor.  then straight up back.  Sitting down: stand with right foot near chair & left prosthetic foot forward a little bow to SMELL to flower unlocking prosthetic knee reach with LEFT hand first to sit.    Sit at edge of chair with both feet touching & with back up straight. Move head 10 times with eyes open then 10 times eyes closed. Head motion is important.  Side to side.  Up tilting head back & down chin to chest. Diagonal up to right then down to left.  Diagonal up to left then down to right.  ASSESSMENT: CLINICAL IMPRESSION: PT set up HEP at sink for balance & trunk posture. She appears to understand including safe set up.  PT began vestibular exercises seated at edge of chair without back support eyes open & closed. Pt denies "vertigo."   OBJECTIVE IMPAIRMENTS: Abnormal gait, decreased activity tolerance, decreased balance, decreased endurance, decreased knowledge of condition, decreased knowledge of use of DME, decreased mobility, difficulty walking, decreased ROM, decreased strength, impaired flexibility, postural dysfunction, prosthetic dependency , and pain.    ACTIVITY LIMITATIONS: carrying, lifting, bending, sitting, standing, sleeping, stairs, transfers, and locomotion level   PARTICIPATION LIMITATIONS: meal prep, cleaning, laundry, community activity, and yard work   PERSONAL FACTORS: Age, Fitness, Past/current experiences, Time since onset of injury/illness/exacerbation, and 3+ comorbidities: see PMH  are also affecting patient's functional outcome.    REHAB POTENTIAL: Good   CLINICAL DECISION MAKING: Evolving/moderate complexity   EVALUATION COMPLEXITY: Moderate     GOALS: Goals reviewed with patient? Yes   SHORT TERM GOALS: Target date: 03/12/2022   Patient verbalizes  & demonstrates understanding of HEP. Baseline: SEE OBJECTIVE DATA Goal status: INITIAL 2.  Patient reports donning prosthesis within 90 min of arising in mornings.  Baseline: SEE OBJECTIVE DATA Goal status: INITIAL   3.  Patient sit to/from stand from chairs without armrests using UEs & stabilizes correctly. Baseline: SEE OBJECTIVE DATA Goal status: INITIAL   4. Patient ambulates 54' with [facility] & prosthesis with  minA. Baseline: SEE OBJECTIVE DATA Goal status: INITIAL     LONG TERM GOALS: Target date: 05/08/2022   Patient demonstrates & verbalized understanding of prosthetic care to enable safe utilization of prosthesis. Baseline: SEE OBJECTIVE DATA Goal status: INITIAL   Patient tolerates prosthesis wear >90% of awake hours without skin or limb pain issues. Baseline: SEE OBJECTIVE DATA Goal status: INITIAL   Berg Balance >/= 36/56 to indicate lower fall risk Baseline: SEE OBJECTIVE DATA Goal status: INITIAL   Patient ambulates >200' with prosthesis & SBQC modified independently Baseline: SEE OBJECTIVE DATA Goal status: INITIAL   Patient negotiates ramps, curbs & stairs with single rail with prosthesis & SBQC with family safely.  Baseline: SEE OBJECTIVE DATA Goal status: INITIAL   Patient verbalizes & demonstrates understanding of ongoing HEP Baseline: SEE OBJECTIVE DATA Goal status: INITIAL   7.  Patient reports low back pain increases </= 2 increments with above activities. Baseline: SEE OBJECTIVE DATA Goal status: INITIAL   PLAN:   PT FREQUENCY: 1x/week   PT DURATION:  90 days    PLANNED INTERVENTIONS: Therapeutic exercises, Therapeutic activity, Neuromuscular re-education, Balance training, Gait training, Patient/Family education, Self Care, Stair training, Vestibular training, Prosthetic training, DME instructions, Manual therapy, Re-evaluation, and Physical performance testng.   PLAN FOR NEXT SESSION:  check on HEP from 2/5 session & progress head turns to  standing.   check rollator use on ramp/curb,    Jamey Reas, PT, DPT 02/26/2022, 10:30 AM

## 2022-02-28 ENCOUNTER — Ambulatory Visit (INDEPENDENT_AMBULATORY_CARE_PROVIDER_SITE_OTHER)
Admission: RE | Admit: 2022-02-28 | Discharge: 2022-02-28 | Disposition: A | Discharge status: Home / Self Care | Payer: Medicare Other | Source: Ambulatory Visit | Attending: Vascular Surgery | Admitting: Vascular Surgery

## 2022-02-28 ENCOUNTER — Ambulatory Visit (INDEPENDENT_AMBULATORY_CARE_PROVIDER_SITE_OTHER): Payer: Medicare Other | Admitting: Physician Assistant

## 2022-02-28 ENCOUNTER — Ambulatory Visit (HOSPITAL_COMMUNITY)
Admission: RE | Admit: 2022-02-28 | Discharge: 2022-02-28 | Disposition: A | Discharge status: Home / Self Care | Payer: Medicare Other | Source: Ambulatory Visit | Attending: Vascular Surgery | Admitting: Vascular Surgery

## 2022-02-28 VITALS — BP 153/81 | HR 73 | Temp 97.6°F | Ht 63.0 in | Wt 144.0 lb

## 2022-02-28 DIAGNOSIS — I739 Peripheral vascular disease, unspecified: Secondary | ICD-10-CM

## 2022-02-28 LAB — VAS US ABI WITH/WO TBI: Right ABI: 0.46

## 2022-02-28 NOTE — Progress Notes (Signed)
[facility] HISTORY AND PHYSICAL   History of Present Illness:  Patient is a 82 y.o. year old female who presents for evaluation of right LE PAD.  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   She reports that she has had right GT toe burning type pain. For a while.  Massaging it with lotion helps a little.  She more recently has dorsalis pedis access for treatment of in-stent right SFA stenosis as well as treatment of anterior tibial artery occlusion.  She remains on aspirin Plavix does have a severe allergy to statins now on Repatha.   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[place])    Claudication ([place])    Coronary artery disease    stent 2007   Depression    Diabetes mellitus    Difficulty sleeping    Diverticulosis 12/10/03   Elevated cholesterol    Generalized weakness    GERD (gastroesophageal reflux disease)    Heart murmur    Hepatitis    as an infant   HSV-1 (herpes simplex virus 1) infection    Hyperlipidemia    Hypertension    Hypothyroidism    Osteoarthritis    PAD (peripheral artery disease) ([place])    Personal history of chemotherapy    Personal history of radiation therapy    Pneumonia    2014 september and March 2022   Rheumatoid arthritis ([place])    S/P aortic valve replacement with bioprosthetic valve 05/04/2020   Edwards Inspiris Resilia stented bovine pericardial tissue valve, size 23 mm   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   LIMA to D3, SVG to PDA, EVH via right thigh   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   Dr [name]   Spinal stenosis    Thyroid  disease     Past Surgical History:  Procedure Laterality Date   ABDOMINAL AORTOGRAM W/LOWER EXTREMITY Bilateral 04/29/2019   Procedure: ABDOMINAL AORTOGRAM W/LOWER EXTREMITY;  Surgeon: Marty Heck, MD;  Location: [facility];  Service: Cardiovascular;  Laterality: Bilateral;   ABDOMINAL AORTOGRAM W/LOWER EXTREMITY Left 07/01/2019   Procedure: ABDOMINAL AORTOGRAM W/ Left LOWER EXTREMITY [place];  Surgeon: Marty Heck, MD;  Location: [facility];  Service: Cardiovascular;  Laterality: Left;   ABDOMINAL AORTOGRAM W/LOWER EXTREMITY Left 03/03/2021   Procedure: ABDOMINAL AORTOGRAM W/LOWER EXTREMITY;  Surgeon: Cherre Robins, MD;  Location: [facility];  Service: Cardiovascular;  Laterality: Left;   ABDOMINAL AORTOGRAM W/LOWER EXTREMITY Right 06/12/2021   Procedure: ABDOMINAL AORTOGRAM W/LOWER EXTREMITY;  Surgeon: Waynetta Sandy, MD;  Location: [facility];  Service: Cardiovascular;  Laterality: Right;  LOWER LEG   AMPUTATION Left 03/06/2021   Procedure: LEFT ABOVE KNEE AMPUTATION;  Surgeon: [name], MD;  Location: [facility];  Service: Vascular;  Laterality: Left;   ANTERIOR AND POSTERIOR REPAIR N/A 04/05/2016   Procedure: ANTERIOR (CYSTOCELE) AND POSTERIOR REPAIR (RECTOCELE);  Surgeon: [name], MD;  Location: [facility];  Service: Gynecology;  Laterality: N/A;   AORTIC VALVE REPLACEMENT N/A 05/04/2020   Procedure: AORTIC VALVE REPLACEMENT (AVR) USING INSPIRIS 23MM RESILIA AORTIC VALVE;  Surgeon: Rexene Alberts, MD;  Location: [facility];  Service: Open Heart Surgery;  Laterality: N/A;   BREAST BIOPSY     BREAST LUMPECTOMY  1983   benign biopsy   BREAST LUMPECTOMY Right 12/29/2010   snbx, ER/PR +, Her2 -, 0/1 node pos.   CARPAL TUNNEL RELEASE Bilateral 9/12,8,12   CHOLECYSTECTOMY     COLONOSCOPY N/A 02/09/2013   Procedure: COLONOSCOPY;  Surgeon: [name], MD;  Location: [facility];  Service: Endoscopy;  Laterality: N/A;   COLONOSCOPY      CORONARY ANGIOPLASTY     CORONARY ARTERY BYPASS GRAFT N/A 05/04/2020   Procedure: CORONARY ARTERY BYPASS GRAFTING (CABG), ON PUMP, TIMES TWO, USING LEFT INTERNAL MAMMARY ARTERY AND RIGHT ENDOSCOPICALLY HARVESTED GREATER SAPHENOUS VEIN;  Surgeon: Rexene Alberts, MD;  Location: [facility];  Service: Open Heart Surgery;  Laterality: N/A;   DILATION AND CURETTAGE OF UTERUS     ESOPHAGOGASTRODUODENOSCOPY N/A 02/09/2013   Procedure: ESOPHAGOGASTRODUODENOSCOPY (EGD);  Surgeon: [name], MD;  Location: [name];  Service: Endoscopy;  Laterality: N/A;   FOOT SPURS     HYSTEROSCOPY WITH D & C N/A 09/11/2012   Procedure: DILATATION AND CURETTAGE ;  Surgeon: [name], MD;  Location: [facility];  Service: Gynecology;  Laterality: N/A;   KNEE SURGERY  2007   LAPAROSCOPIC ASSISTED VAGINAL HYSTERECTOMY N/A 04/05/2016   Procedure: LAPAROSCOPIC ASSISTED VAGINAL HYSTERECTOMY possible BSO;  Surgeon: [name], MD;  Location: [facility];  Service: Gynecology;  Laterality: N/A;   LUMBAR LAMINECTOMY/DECOMPRESSION MICRODISCECTOMY N/A 10/20/2014   Procedure: MICRO LUMBER DECOMPRESSION L3-4 L4-5;  Surgeon: [name], MD;  Location: [facility];  Service: Orthopedics;  Laterality: N/A;   LUMBAR LAMINECTOMY/DECOMPRESSION MICRODISCECTOMY Bilateral 10/13/2015   Procedure: MICRO LUMBAR DECOMPRESSION L5 - S1 AND REDO DECOMPRESSION L4 - L5 AND REMOVAL OF FACET CYST L4 - L5 2 LEVELS;  Surgeon: [name], MD;  Location: [facility];  Service: Orthopedics;  Laterality: Bilateral;   PERIPHERAL VASCULAR ATHERECTOMY Right 06/12/2021   Procedure: PERIPHERAL VASCULAR ATHERECTOMY;  Surgeon: Waynetta Sandy, MD;  Location: [facility];  Service: Cardiovascular;  Laterality: Right;  SFA/POP/AT   PERIPHERAL VASCULAR INTERVENTION Right 04/29/2019   Procedure: PERIPHERAL VASCULAR INTERVENTION;  Surgeon: Marty Heck, MD;  Location: [facility];  Service: Cardiovascular;  Laterality: Right;   PERIPHERAL  VASCULAR INTERVENTION Right 06/12/2021   Procedure: PERIPHERAL VASCULAR INTERVENTION;  Surgeon: Waynetta Sandy, MD;  Location: [facility];  Service: Cardiovascular;  Laterality: Right;  SFP/POPLITEAL/AT   PORT-A-CATH REMOVAL  04/17/2011   Procedure: REMOVAL PORT-A-CATH;  Surgeon: [name], MD;  Location: [facility];  Service: General;  Laterality: Left;   PORTACATH PLACEMENT  02/07/2011   Procedure: INSERTION PORT-A-CATH;  Surgeon: [name], MD;  [facility];  Service: General;  Laterality: N/A;   RIGHT/LEFT HEART CATH AND CORONARY ANGIOGRAPHY N/A 03/17/2020   Procedure: RIGHT/LEFT HEART CATH AND CORONARY ANGIOGRAPHY;  Surgeon: Burnell Blanks, MD;  Location: [facility];  Service: Cardiovascular;  Laterality: N/A;   TEE WITHOUT CARDIOVERSION N/A 05/04/2020   Procedure: TRANSESOPHAGEAL ECHOCARDIOGRAM (TEE);  Surgeon: Rexene Alberts, MD;  Location: [facility];  Service: Open Heart Surgery;  Laterality: N/A;   UPPER GASTROINTESTINAL ENDOSCOPY      ROS:   General:  No weight loss, Fever,  chills  HEENT: No recent headaches, no nasal bleeding, no visual changes, no sore throat  Neurologic: No dizziness, blackouts, seizures. No recent symptoms of stroke or mini- stroke. No recent episodes of slurred speech, or temporary blindness.  Cardiac: No recent episodes of chest pain/pressure, no shortness of breath at rest.  No shortness of breath with exertion.  Denies history of atrial fibrillation or irregular heartbeat  Vascular: No history of rest pain in feet.  positive history of claudication.  Positive history of non-healing ulcer, No history of DVT   Pulmonary: No home oxygen, no productive cough, no hemoptysis,  No asthma or wheezing  Musculoskeletal:  '[ ]'$  Arthritis, '[ ]'$  Low back pain,  '[ ]'$  Joint pain  Hematologic:No history of hypercoagulable state.  No history of easy bleeding.  No history of anemia  Gastrointestinal: No  hematochezia or melena,  No gastroesophageal reflux, no trouble swallowing  Urinary: '[ ]'$  chronic Kidney disease, '[ ]'$  on HD - '[ ]'$  MWF or '[ ]'$  TTHS, '[ ]'$  Burning with urination, '[ ]'$  Frequent urination, '[ ]'$  Difficulty urinating;   Skin: No rashes  Psychological: No history of anxiety,  No history of depression  Social History Social History   Tobacco Use   Smoking status: Former    Packs/day: 2.00    Years: 40.00    Total pack years: 80.00    Types: Cigarettes    Quit date: 12/19/1997    Years since quitting: 24.2   Smokeless tobacco: Never  Vaping Use   Vaping Use: Never used  Substance Use Topics   Alcohol use: Yes    Alcohol/week: 1.0 standard drink of alcohol    Types: 1 Glasses of wine per week    Comment: occasiona - maybe once a month   Drug use: No    Family History Family History  Problem Relation Age of Onset   Kidney disease Mother    Heart disease Mother    Throat cancer Father    Alcoholism Father    Heart attack Father    Lymphoma Brother 70   Heart attack Brother    Diabetes Brother    Hypertension Brother    Cancer Son    Colon cancer Neg Hx    Stroke Neg Hx    Esophageal cancer Neg Hx    Rectal cancer Neg Hx    Stomach cancer Neg Hx     Allergies  Allergies  Allergen Reactions   Codeine Nausea And Vomiting and Other (See Comments)    Severe stomach cramps, also Other reaction(s): Unknown   Antihistamines, Diphenhydramine-Type Other (See Comments)    Causes hyperactivity   Diphenhydramine     Other reaction(s): Unknown   Folic Acid Other (See Comments)    Other reaction(s): tongue pain   Gabapentin     Urinary incontinence   Lyrica [Pregabalin]     Double vision/falls   Methotrexate Other (See Comments)    Other reaction(s): oral ulcers   Oxycodone     Hallucinations/crazy feelings   Rheumate [Fish Oil]     Other reaction(s): hypoglycemia   Statins Other (See Comments)    Joint pains Other reaction(s): Unknown   Sulfa Antibiotics  Other (See Comments)    Reaction not recalled   Erythromycin Hives, Rash and Other (See Comments)    Allergic due to dental work from 62 years ago. It was used in packing and resulted in rash/hives inside and outside of mouth.   Trulicity [Dulaglutide] Itching  Current Outpatient Medications  Medication Sig Dispense Refill   Accu-Chek FastClix Lancets MISC USE TO CHECK BLOOD SUGARS 3 TIMES DAILY 306 each 3   amLODipine (NORVASC) 10 MG tablet Take 10 mg by mouth daily.     ascorbic acid (VITAMIN C) 250 MG tablet Take 1 tablet (250 mg total) by mouth 2 (two) times daily. 60 tablet 0   aspirin EC 81 MG tablet Take 81 mg by mouth in the morning.     blood glucose meter kit and supplies Dispense based on patient and insurance preference. Use up to four times daily as directed. (FOR ICD-10 E10.9, E11.9). 1 each 0   Cholecalciferol (D3 5000 PO) Take by mouth daily.     clopidogrel (PLAVIX) 75 MG tablet Take 1 tablet (75 mg total) by mouth daily. 30 tablet 11   glipiZIDE (GLUCOTROL XL) 10 MG 24 hr tablet Take 1 tablet (10 mg total) by mouth daily with breakfast. 90 tablet 3   glucose blood (ACCU-CHEK GUIDE) test strip TEST ONCE DAILY 100 each 5   Golimumab (SIMPONI ARIA IV) Inject into the vein every 2 (two) months.     Insulin Pen Needle (CAREFINE PEN NEEDLES) 32G X 6 MM MISC 1 application by Does not apply route 2 (two) times daily. 120 each 1   LANTUS SOLOSTAR 100 UNIT/ML Solostar Pen ADMINISTER 15 UNITS UNDER THE SKIN TWICE DAILY 15 mL 3   levothyroxine (SYNTHROID) 125 MCG tablet TAKE 1 TABLET BY MOUTH DAILY 90 tablet 3   metoprolol tartrate (LOPRESSOR) 50 MG tablet Take 1 tablet (50 mg total) by mouth 2 (two) times daily. 30 tablet 0   pantoprazole (PROTONIX) 40 MG tablet TAKE 1 TABLET BY MOUTH DAILY 90 tablet 3   PARoxetine (PAXIL) 20 MG tablet TAKE 1 TABLET BY MOUTH IN THE  MORNING 90 tablet 3   REPATHA SURECLICK 782 MG/ML SOAJ INJECT 1 PEN INTO THE SKIN EVERY 14 DAYS 2 mL 11    Semaglutide,0.25 or 0.'5MG'$ /DOS, 2 MG/1.5ML SOPN Inject 0.5 mg into the skin every Tuesday. 3 mL 6   valACYclovir (VALTREX) 1000 MG tablet Take 1 tablet (1,000 mg total) by mouth daily as needed (Fever Blisters). 180 tablet 0   icosapent Ethyl (VASCEPA) 1 g capsule TAKE 2 CAPSULES(2 GRAMS) BY MOUTH TWICE DAILY 120 capsule 9   Multiple Vitamin (MULTIVITAMIN) tablet Take 1 tablet by mouth daily.     No current facility-administered medications for this visit.    Physical Examination  Vitals:   02/28/22 1325  BP: (!) 153/81  Pulse: 73  Temp: 97.6 F (36.4 C)  TempSrc: Temporal  SpO2: 98%  Weight: 144 lb (65.3 kg)  Height: '5\' 3"'$  (1.6 m)    Body mass index is 25.51 kg/m.  General:  Alert and oriented, no acute distress HEENT: Normal Neck: No bruit or JVD Pulmonary: Clear to auscultation bilaterally Cardiac: Regular Rate and Rhythm without murmur Abdomen: Soft, non-tender, non-distended, no mass, no scars Skin: No rash, no ischemic changes right LE Extremity Pulses:  2+ radial,  femoral pulses bilaterally Musculoskeletal: No deformity or edema, right AKA prothesis  Neurologic: Upper and lower extremity motor grossly intact and symmetric  DATA:  ABI Findings:  +---------+------------------+-----+----------+--------+  Right   Rt Pressure (mmHg)IndexWaveform  Comment   +---------+------------------+-----+----------+--------+  Brachial 168                                        +---------+------------------+-----+----------+--------+  PTA     77                0.46 monophasic          +---------+------------------+-----+----------+--------+  DP      67                0.40 monophasic          +---------+------------------+-----+----------+--------+  Great Toe26                0.15                     +---------+------------------+-----+----------+--------+   +--------+------------------+-----+--------+-------+  Left   Lt Pressure  (mmHg)IndexWaveformComment  +--------+------------------+-----+--------+-------+  [id number]                                    +--------+------------------+-----+--------+-------+  PTA                                   AKA      +--------+------------------+-----+--------+-------+  DP                                    AKA      +--------+------------------+-----+--------+-------+   +-------+-----------+-----------+------------+------------+  ABI/TBIToday's ABIToday's TBIPrevious ABIPrevious TBI  +-------+-----------+-----------+------------+------------+  Right 0.46       0.15       0.88        0.37          +-------+-----------+-----------+------------+------------+  Left  AKA        AKA        AKA         AKA           +-------+-----------+-----------+------------+------------+    Right ABIs and TBIs appear decreased compared to prior study on 07/26/2021.    Summary:  Right: Resting right ankle-brachial index indicates severe right lower  extremity arterial disease. The right toe-brachial index is abnormal.   +-----------+--------+-----+---------------+----------+--------+  RIGHT     PSV cm/sRatioStenosis       Waveform  Comments  +-----------+--------+-----+---------------+----------+--------+  CFA Distal 109                         biphasic            +-----------+--------+-----+---------------+----------+--------+  DFA       125                         biphasic            +-----------+--------+-----+---------------+----------+--------+  SFA Prox   63                          triphasic           +-----------+--------+-----+---------------+----------+--------+  POP Prox   41                                              +-----------+--------+-----+---------------+----------+--------+  ATA Prox   43  monophasic           +-----------+--------+-----+---------------+----------+--------+  ATA Mid    49                          monophasic          +-----------+--------+-----+---------------+----------+--------+  ATA Distal 19                          monophasic          +-----------+--------+-----+---------------+----------+--------+  PTA Prox   312          50-74% stenosismonophasic          +-----------+--------+-----+---------------+----------+--------+  PTA Mid    42                          monophasic          +-----------+--------+-----+---------------+----------+--------+  PTA Distal 16                          monophasic          +-----------+--------+-----+---------------+----------+--------+  PERO Distal52                          monophasic          +-----------+--------+-----+---------------+----------+--------+       Right Stent(s):  +--------------------+--------+---------------+----------+--------+  SFA-popliteal arteryPSV cm/sStenosis       Waveform  Comments  +--------------------+--------+---------------+----------+--------+  Prox to Stent       52                     triphasic           +--------------------+--------+---------------+----------+--------+  Proximal Stent      64                     triphasic           +--------------------+--------+---------------+----------+--------+  Mid Stent           39                     triphasic           +--------------------+--------+---------------+----------+--------+  Distal Stent        335     50-99% stenosismonophasic          +--------------------+--------+---------------+----------+--------+  Distal to Stent     138                    monophasic          +--------------------+--------+---------------+----------+--------+      Summary:  Right: 50-99 stenosis is noted within the distal SFA-popliteal artery  stent. 50-74% stenosis noted in the proximal  posterior tibial artery.  Right anterior tibial artery stent was unable to be visualized, however,  the artery appears patent.   ASSESSMENT/PLAN:  Patient is a 82 y.o. year old female who presents for evaluation of right LE PAD.  Most recent intervention was   Laser atherectomy of in-stent restenosis right SFA, occluded right popliteal artery and occluded anterior tibial artery with 1.75m [place]  Drug-coated balloon angioplasty right SFA in-stent restenosis with 6 mm Ranger  Stent of right SFA and popliteal with 5 x 150 mm bio mimics and stent of right anterior tibial artery with 3.5 x 429mSynergy with CO2.  By Dr.  Cain on 06/12/21 for CLI without tissue loss.   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    She reports that she has had right GT toe burning type pain.   She states this pain has been present since May.    He ABI's and Duplex show stenosis in the stent.  The combination of stenosis and her GT toe symptoms  need further intervention with a repeat angiogram and possible intervention.      Roxy Horseman PA-C [facility] Office: [phone]  MD in clinic [place]

## 2022-03-01 ENCOUNTER — Other Ambulatory Visit: Payer: Self-pay

## 2022-03-01 ENCOUNTER — Encounter: Payer: Self-pay | Admitting: Physician Assistant

## 2022-03-01 DIAGNOSIS — I739 Peripheral vascular disease, unspecified: Secondary | ICD-10-CM

## 2022-03-01 DIAGNOSIS — T82856A Stenosis of peripheral vascular stent, initial encounter: Secondary | ICD-10-CM

## 2022-03-05 ENCOUNTER — Encounter: Payer: Self-pay | Admitting: Physical Therapy

## 2022-03-05 ENCOUNTER — Ambulatory Visit (INDEPENDENT_AMBULATORY_CARE_PROVIDER_SITE_OTHER): Payer: Medicare Other | Admitting: Physical Therapy

## 2022-03-05 DIAGNOSIS — M25652 Stiffness of left hip, not elsewhere classified: Secondary | ICD-10-CM | POA: Diagnosis not present

## 2022-03-05 DIAGNOSIS — R2689 Other abnormalities of gait and mobility: Secondary | ICD-10-CM | POA: Diagnosis not present

## 2022-03-05 DIAGNOSIS — M5459 Other low back pain: Secondary | ICD-10-CM

## 2022-03-05 DIAGNOSIS — R2681 Unsteadiness on feet: Secondary | ICD-10-CM

## 2022-03-05 DIAGNOSIS — Z9181 History of falling: Secondary | ICD-10-CM

## 2022-03-05 DIAGNOSIS — R293 Abnormal posture: Secondary | ICD-10-CM

## 2022-03-05 DIAGNOSIS — M6281 Muscle weakness (generalized): Secondary | ICD-10-CM

## 2022-03-05 NOTE — Therapy (Signed)
OUTPATIENT PHYSICAL THERAPY TREATMENT NOTE   Patient Name: [name] MRN: [id number] DOB:February 05, 1940, 82 y.o., female Today's Date: 03/05/2022  PCP: [name], MD  REFERRING PROVIDER: [name], MD   END OF SESSION:   PT End of Session - 03/05/22 0909     Visit Number 4    Number of Visits 14    Date for PT Re-Evaluation 05/08/22    Authorization Type [employer] Medicare    Authorization Time Period no co-pay, no visit limit    Progress Note Due on Visit 10    PT Start Time 0915    PT Stop Time 0957    PT Time Calculation (min) 42 min    Equipment Utilized During Treatment Gait belt    Activity Tolerance Patient tolerated treatment well    Behavior During Therapy WFL for tasks assessed/performed               Past Medical History:  Diagnosis Date   Allergy    Anemia    childhood   Anxiety    Aortic stenosis    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   Arthritis    Breast cancer ([place]) 12/05/10   R breast, inv mammary, in situ,ER/PR +,HER2 -   Cancer ([place])    Carcinoma of breast treated with adjuvant chemotherapy ([place])    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[place])    Claudication ([place])    Coronary artery disease    stent 2007   Depression    Diabetes mellitus    Difficulty sleeping    Diverticulosis 12/10/03   Elevated cholesterol    Generalized weakness    GERD (gastroesophageal reflux disease)    Heart murmur    Hepatitis    as an infant   HSV-1 (herpes simplex virus 1) infection    Hyperlipidemia    Hypertension    Hypothyroidism    Osteoarthritis    PAD (peripheral artery disease) ([place])    Personal history of chemotherapy    Personal history of radiation therapy    Pneumonia    2014 september and March 2022   Rheumatoid arthritis ([place])    S/P aortic valve replacement with bioprosthetic valve 05/04/2020   [facility] Inspiris Resilia stented bovine pericardial tissue valve, size 23 mm   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   LIMA to D3, SVG to  PDA, EVH via right thigh   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   Dr [name]   Spinal stenosis    Thyroid disease    Past Surgical History:  Procedure Laterality Date   ABDOMINAL AORTOGRAM W/LOWER EXTREMITY Bilateral 04/29/2019   Procedure: ABDOMINAL AORTOGRAM W/LOWER EXTREMITY;  Surgeon: Marty Heck, MD;  Location: [facility];  Service: Cardiovascular;  Laterality: Bilateral;   ABDOMINAL AORTOGRAM W/LOWER EXTREMITY Left 07/01/2019   Procedure: ABDOMINAL AORTOGRAM W/ Left LOWER EXTREMITY [place];  Surgeon: Marty Heck, MD;  Location: [facility];  Service: Cardiovascular;  Laterality: Left;   ABDOMINAL AORTOGRAM W/LOWER EXTREMITY Left 03/03/2021   Procedure: ABDOMINAL AORTOGRAM W/LOWER EXTREMITY;  Surgeon: Cherre Robins, MD;  Location: [facility];  Service: Cardiovascular;  Laterality: Left;   ABDOMINAL AORTOGRAM W/LOWER EXTREMITY Right 06/12/2021   Procedure: ABDOMINAL AORTOGRAM W/LOWER EXTREMITY;  Surgeon: Waynetta Sandy, MD;  Location: [facility];  Service: Cardiovascular;  Laterality: Right;  LOWER LEG   AMPUTATION Left 03/06/2021   Procedure: LEFT ABOVE KNEE AMPUTATION;  Surgeon: [name], MD;  [facility];  Service: Vascular;  Laterality: Left;   ANTERIOR AND POSTERIOR REPAIR N/A 04/05/2016   Procedure: ANTERIOR (CYSTOCELE) AND POSTERIOR REPAIR (RECTOCELE);  Surgeon: [name], MD;  Location: [facility];  Service: Gynecology;  Laterality: N/A;   AORTIC VALVE REPLACEMENT N/A 05/04/2020   Procedure: AORTIC VALVE REPLACEMENT (AVR) USING INSPIRIS 23MM RESILIA AORTIC VALVE;  Surgeon: Rexene Alberts, MD;  Location: [facility];  Service: Open Heart Surgery;  Laterality: N/A;   BREAST BIOPSY     BREAST LUMPECTOMY  1983   benign biopsy   BREAST LUMPECTOMY Right 12/29/2010   snbx, ER/PR +, Her2 -, 0/1 node pos.   CARPAL TUNNEL RELEASE Bilateral 9/12,8,12   CHOLECYSTECTOMY     COLONOSCOPY N/A 02/09/2013   Procedure: COLONOSCOPY;   Surgeon: [name], MD;  Location: [facility];  Service: Endoscopy;  Laterality: N/A;   COLONOSCOPY     CORONARY ANGIOPLASTY     CORONARY ARTERY BYPASS GRAFT N/A 05/04/2020   Procedure: CORONARY ARTERY BYPASS GRAFTING (CABG), ON PUMP, TIMES TWO, USING LEFT INTERNAL MAMMARY ARTERY AND RIGHT ENDOSCOPICALLY HARVESTED GREATER SAPHENOUS VEIN;  Surgeon: Rexene Alberts, MD;  Location: [facility];  Service: Open Heart Surgery;  Laterality: N/A;   DILATION AND CURETTAGE OF UTERUS     ESOPHAGOGASTRODUODENOSCOPY N/A 02/09/2013   Procedure: ESOPHAGOGASTRODUODENOSCOPY (EGD);  Surgeon: [name], MD;  Location: [name];  Service: Endoscopy;  Laterality: N/A;   FOOT SPURS     HYSTEROSCOPY WITH D & C N/A 09/11/2012   Procedure: DILATATION AND CURETTAGE ;  Surgeon: [name], MD;  Location: [facility];  Service: Gynecology;  Laterality: N/A;   KNEE SURGERY  2007   LAPAROSCOPIC ASSISTED VAGINAL HYSTERECTOMY N/A 04/05/2016   Procedure: LAPAROSCOPIC ASSISTED VAGINAL HYSTERECTOMY possible BSO;  Surgeon: [name], MD;  Location: [facility];  Service: Gynecology;  Laterality: N/A;   LUMBAR LAMINECTOMY/DECOMPRESSION MICRODISCECTOMY N/A 10/20/2014   Procedure: MICRO LUMBER DECOMPRESSION L3-4 L4-5;  Surgeon: [name], MD;  [facility]: [facility];  Service: Orthopedics;  Laterality: N/A;   LUMBAR LAMINECTOMY/DECOMPRESSION MICRODISCECTOMY Bilateral 10/13/2015   Procedure: MICRO LUMBAR DECOMPRESSION L5 - S1 AND REDO DECOMPRESSION L4 - L5 AND REMOVAL OF FACET CYST L4 - L5 2 LEVELS;  Surgeon: [name], MD;  Location: [facility];  Service: Orthopedics;  Laterality: Bilateral;   PERIPHERAL VASCULAR ATHERECTOMY Right 06/12/2021   Procedure: PERIPHERAL VASCULAR ATHERECTOMY;  Surgeon: Waynetta Sandy, MD;  Location: [facility];  Service: Cardiovascular;  Laterality: Right;  SFA/POP/AT   PERIPHERAL VASCULAR INTERVENTION Right 04/29/2019   Procedure: PERIPHERAL VASCULAR INTERVENTION;  Surgeon: Marty Heck, MD;  Location: [place];  Service: Cardiovascular;  Laterality: Right;   PERIPHERAL VASCULAR INTERVENTION Right 06/12/2021   Procedure: PERIPHERAL VASCULAR INTERVENTION;  Surgeon: Waynetta Sandy, MD;  Location: [facility];  Service: Cardiovascular;  Laterality: Right;  SFP/POPLITEAL/AT   PORT-A-CATH REMOVAL  04/17/2011   Procedure: REMOVAL PORT-A-CATH;  Surgeon: [name], MD;  Location: [facility];  Service: General;  Laterality: Left;   PORTACATH PLACEMENT  02/07/2011   Procedure: INSERTION PORT-A-CATH;  Surgeon: [name], MD;  [facility];  Service: General;  Laterality: N/A;   RIGHT/LEFT HEART CATH AND CORONARY ANGIOGRAPHY N/A 03/17/2020   Procedure: RIGHT/LEFT HEART CATH AND CORONARY ANGIOGRAPHY;  Surgeon: Burnell Blanks, MD;  Location: [facility];  Service: Cardiovascular;  Laterality: N/A;   TEE WITHOUT CARDIOVERSION N/A 05/04/2020  Procedure: TRANSESOPHAGEAL ECHOCARDIOGRAM (TEE);  Surgeon: Rexene Alberts, MD;  Location: [facility];  Service: Open Heart Surgery;  Laterality: N/A;   UPPER GASTROINTESTINAL ENDOSCOPY     Patient Active Problem List   Diagnosis Date Noted   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[place]) 03/13/2021   Sinus tachycardia 03/11/2021   Elevated troponin 03/08/2021   Stroke-like symptoms 03/08/2021   Anemia of chronic disease 03/08/2021   Acute supraglottitis with epiglottitis 02/05/2021   Epiglottitis 02/05/2021   Reactive airway disease 01/20/2021   Diabetic peripheral neuropathy ([place]) 12/19/2020   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   Primary insomnia 11/02/2020   Irritation of eyelid 07/26/2020   S/P aortic valve replacement with bioprosthetic valve 05/04/2020   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   Peripheral artery disease ([facility])    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[place]) 04/07/2019   Low back pain 02/03/2019   Scoliosis deformity of spine  02/03/2019   Osteoarthritis of hip 11/17/2018   Claudication ([place])    Chronic headaches 11/12/2017   Degeneration of lumbar intervertebral disc 06/13/2017   Hyperkalemia 06/03/2017   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[place])    Mass of left adrenal gland ([place]) 04/19/2017   Spinal stenosis of lumbar region 10/20/2014   Hyperlipidemia 03/16/2013   Dense breasts 12/22/2012   Anxiety state 04/16/2012   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/16/2012   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   Osteoarthritis of knee 04/16/2012   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 female breast ([place]) 12/20/2010   History of breast cancer 12/05/2010   Depression 09/14/2008   GERD 09/14/2008   Type 2 diabetes mellitus with neurologic complication, with long-term current use of insulin ([place]) 05/20/2008   Coronary atherosclerosis 04/17/2007   Hypothyroidism 04/03/2007    REFERRING DIAG: [id number] (ICD-10-CM) - Above knee amputation of left lower extremity   ONSET DATE: 02/02/2022 MD referral to PT   THERAPY DIAG:  Other abnormalities of gait and mobility  Unsteadiness on feet  Muscle weakness (generalized)  Stiffness of left hip, not elsewhere classified  Abnormal posture  History of falling  Other low back pain  Rationale for Evaluation and Treatment Rehabilitation  PERTINENT HISTORY: Left TFA, DM2, neuropathy, PVD, CKD st4, RLE stent, aortic stenosis, CAD, CABG X 2, HTN, OA, lumbar spinal stenosis, breast CA   PRECAUTIONS: Fall  SUBJECTIVE:                                                                                                                                                                                      SUBJECTIVE STATEMENT:  She is having pain  in left lower leg & foot.  She saw vascular doctor last week & they are doing vascular test on Wednesday, 2/14.  She wears prosthesis all awake hours.  She noticed her right leg hurt with distance.  She  has been doing her exercises. She has not noticed dizziness in last week.   PAIN:  Are you having pain? Yes: NPRS scale: today  0/10 and in last week 0/10, when doing stretch of hip flexors 6/10 Pain location: right lumbar  Pain description: achy Aggravating factors: walking without shoes with rollator Relieving factors: sit down   OBJECTIVE: (objective measures completed at initial evaluation unless otherwise dated)   COGNITION: Overall cognitive status: Within functional limits for tasks assessed   MUSCLE LENGTH: Hamstrings:  Thomas test: Left -37 deg   POSTURE: rounded shoulders, forward head, increased lumbar lordosis, increased thoracic kyphosis, flexed trunk , and weight shift right   LOWER EXTREMITY ROM:   ROM P:passive  A:active Right eval Left eval  Hip flexion      Hip extension Standing -27* excessive lordosis    Hip abduction      Hip adduction      Hip internal rotation      Hip external rotation      Knee flexion      Knee extension      Ankle dorsiflexion      Ankle plantarflexion      Ankle inversion      Ankle eversion       (Blank rows = not tested)   LOWER EXTREMITY MMT:   MMT Right eval Left eval  Hip flexion      Hip extension      Hip abduction      Hip adduction      Hip internal rotation      Hip external rotation      Knee flexion      Knee extension      Ankle dorsiflexion      Ankle plantarflexion      Ankle inversion      Ankle eversion      (Blank rows = not tested)   TRANSFERS: Sit to stand: SBA requires UE assist on armrest, prosthetic knee locked delayed in process impairing stabilization upon arising. Uses back of legs against chair & external support to stabilize Stand to sit: SBA uses back of legs against chair for stability. External support required & armrests on chair.    GAIT: Gait pattern: step to pattern, decreased arm swing- Left, decreased step length- Right, decreased stance time- Left, circumduction- Left,  Left hip hike, antalgic, lateral hip instability, trunk flexed, and abducted- Left Distance walked: 100' Assistive device utilized: Lobbyist with HHA (required) and Walker - 4 wheeled Level of assistance: Mod A with SBQC / HHA and SBA / verbal cues for rollator Gait velocity: 1.22 ft/sec with rollator walker   FUNCTIONAL TESTs:  Eval (02/07/2022): Timed up and go (TUG): 28.97 sec with rollator Berg Balance Scale: 21/56   [facility] PT Assessment - 02/07/22 1100                Standardized Balance Assessment    Standardized Balance Assessment Berg Balance Test;Timed Up and Go Test          Berg Balance Test    Sit to Stand Able to stand  independently using hands     Standing Unsupported Able to stand safely 2 minutes     Sitting with Back  Unsupported but Feet Supported on Floor or Stool Able to sit safely and securely 2 minutes     Stand to Sit Controls descent by using hands     Transfers Able to transfer safely, definite need of hands     Standing Unsupported with Eyes Closed Able to stand 3 seconds     Standing Unsupported with Feet Together Needs help to attain position but able to stand for 30 seconds with feet together     From Standing, Reach Forward with Outstretched Arm Reaches forward but needs supervision     From Standing Position, Pick up Object from Floor Unable to try/needs assist to keep balance     From Standing Position, Turn to Look Behind Over each Shoulder Needs assist to keep from losing balance and falling     Turn 360 Degrees Needs assistance while turning     Standing Unsupported, Alternately Place Feet on Step/Stool Needs assistance to keep from falling or unable to try     Standing Unsupported, One Foot in [employer] balance while stepping or standing     Standing on One Leg Unable to try or needs assist to prevent fall     Total Score 21     Berg comment: < 36 high risk for falls (close to 100%) 46-51 moderate (>50%)   37-45 significant (>80%) 52-55  lower (> 25%)          Timed Up and Go Test    Normal TUG (seconds) 28.97   rollator walker & prosthesis    TUG Comments TUG:  Normal: >13.5 sec indicates high fall risk  Manual: > 14.5 sec indicates high fall risk  Cognitive: >15 sec indicates high fall risk                   CURRENT PROSTHETIC WEAR ASSESSMENT:  Patient is independent with: skin check, residual limb care, and prosthetic cleaning Patient is dependent with: correct ply sock adjustment, proper wear schedule/adjustment, and proper weight-bearing schedule/adjustment Donning prosthesis: Modified independence per pt report Doffing prosthesis: Modified independence per pt report Prosthetic wear tolerance: ~8 hours/day, 7 days/week (~60-70% of awake hours) Prosthetic weight bearing tolerance: 8 minutes with no c/o limb pain Residual limb condition: pt reports no issues Prosthetic description: silicon liner with suction ring suspension, ischial containment socket with flexible inner socket, non-hydraulic knee,        TODAY'S TREATMENT:                                                                                                                             DATE:  03/05/2022: Prosthetic Training with Transfemoral prosthesis: PT demo & verbal cues on safety with rollator walker use including sit to/from stand on seat & negotiating ramps / curbs. Pt verbalized & return demo prosthetic gait with rollator walker, sit / stand from rollator seat and neg ramp / curb. PT reviewed foot position for stationary activities to facilitate prosthetic knee  staying extended.  Neuromuscular Re-education for balance Standing in corner with chair back anterior for safety.  PT supervision for safety, verbal cues for upright posture with weight on BLEs. Feet hip width apart.  On floor eyes open head movements right/left, up/down & diagonals 8 reps ea. Intermittent touch for balance.  On floor eyes closed head movements right/left, up/down &  diagonals 8 reps ea. LUE touch on chair back for balance & to facilitate weight on prosthesis.   On foam eyes open head movements right/left, up/down & diagonals 8 reps ea. LUE touch on chair back for balance & to facilitate weight on prosthesis.    02/26/2022: Prosthetic Training with Transfemoral prosthesis: Pt amb 50' X 3 with SBQC & HHA / modA.  PT verbal cues ea time for proper sit to stand technique engaging prosthesis.   Neuromuscular Re-ed:  HEP working on balance. Stand in front of sink with one chair back on each side of you and 3rd chair behind you:  Your prosthetic toes should be back ~1" to your right toes.  Both feet facing forward about hip width apart.   Turn in circles using cane.  When going to left lead with left leg.  When going to right, lead with right leg.  Reach both hands over head and return both hands to counter.  Follow your hands with your eyes.  Keep weight on both legs.  Turn your upper body to place item with both hands on back of chair to each side. Stop at counter between directions.  Follow your hands with your eyes.  Keep weight on both legs. Lean to each side placing item in chair bottom with one hand (use right hand going to right & left hand going to left).  Follow your hand with your eyes.  Turn around you are facing chair that was behind you.  Make sure that your back side does not touch counter when you bend forward.  Lean forward to place an item in chair seat then stand back up straight.   Stand up & sit down using chair seat to push up. Goal is to not touch sink unless off balance.   standing Up sit at edge of chair with right foot near chair, left foot /prosthesis with heel touching. push forward to SMELL flower,  keep prosthetic foot in contact with floor.  then straight up back.  Sitting down: stand with right foot near chair & left prosthetic foot forward a little bow to SMELL to flower unlocking prosthetic knee reach with LEFT hand first to  sit.    Sit at edge of chair with both feet touching & with back up straight. Move head 10 times with eyes open then 10 times eyes closed. Head motion is important.  Side to side.  Up tilting head back & down chin to chest. Diagonal up to right then down to left.  Diagonal up to left then down to right.  02/19/2022: Prosthetic training with Transfemoral Prosthesis: C/o headache BP seated 161/89 HR 74  reports took meds this morning. PT demo & verbal cues on sit to/from stand engaging prosthesis & technique for balance / decreasing UE support.  Pt performed 10 reps from chair using seat & progressed to light touch on chair back anteriorly.   PT assessed gait with rollator walker: pt sit to stand with verbal cues to carryover above but safe with rollator.  Pt amb 30' with safe technique.  Stand to sit pt needed PT instruction for  brake management and engaging prosthesis as above.   Pt amb 50' X 2 with SBQC with modA / HHA.  PT verbal & demo cues on Stationary stance with prosthetic foot positioning to maintain extension for stability.   Therapeutic Exercise: Hip flexor stretch LLE hooklying with prosthesis over edge 30 sec hold 2 reps.    HOME EXERCISE PROGRAM: Access Code: [email or web address] URL: [email or web address] Date: 02/19/2022 Prepared by: Jamey Reas  Exercises - sit to stand to sit with counter  - 2-3 x daily - 7 x weekly - 1 sets - 10 reps - 5 seconds hold - Modified Thomas Stretch  - 2-3 x daily - 7 x weekly - 1 sets - 2-3 reps - 20-30 seconds hold  Stand in front of sink with one chair back on each side of you and 3rd chair behind you:  Your prosthetic toes should be back ~1" to your right toes.  Both feet facing forward about hip width apart.   Turn in circles using cane.  When going to left lead with left leg.  When going to right, lead with right leg.  Reach both hands over head and return both hands to counter.  Follow your hands with your eyes.  Keep weight on  both legs.  Turn your upper body to place item with both hands on back of chair to each side. Stop at counter between directions.  Follow your hands with your eyes.  Keep weight on both legs. Lean to each side placing item in chair bottom with one hand (use right hand going to right & left hand going to left).  Follow your hand with your eyes.  Turn around you are facing chair that was behind you.  Make sure that your back side does not touch counter when you bend forward.  Lean forward to place an item in chair seat then stand back up straight.   Stand up & sit down using chair seat to push up. Goal is to not touch sink unless off balance.   standing Up sit at edge of chair with right foot near chair, left foot /prosthesis with heel touching. push forward to SMELL flower,  keep prosthetic foot in contact with floor.  then straight up back.  Sitting down: stand with right foot near chair & left prosthetic foot forward a little bow to SMELL to flower unlocking prosthetic knee reach with LEFT hand first to sit.    Sit at edge of chair with both feet touching & with back up straight. Move head 10 times with eyes open then 10 times eyes closed. Head motion is important.  Side to side.  Up tilting head back & down chin to chest. Diagonal up to right then down to left.  Diagonal up to left then down to right.  ASSESSMENT: CLINICAL IMPRESSION: Patient appears to have better understanding of safety with rollator walker use.  Pt was challenged by balance activities. With vascular procedure this week, PT will hold sending standing balance activities home.     OBJECTIVE IMPAIRMENTS: Abnormal gait, decreased activity tolerance, decreased balance, decreased endurance, decreased knowledge of condition, decreased knowledge of use of DME, decreased mobility, difficulty walking, decreased ROM, decreased strength, impaired flexibility, postural dysfunction, prosthetic dependency , and pain.    ACTIVITY  LIMITATIONS: carrying, lifting, bending, sitting, standing, sleeping, stairs, transfers, and locomotion level   PARTICIPATION LIMITATIONS: meal prep, cleaning, laundry, community activity, and yard work   PERSONAL FACTORS: Age, Fitness, Past/current experiences, Time since onset of  injury/illness/exacerbation, and 3+ comorbidities: see PMH  are also affecting patient's functional outcome.    REHAB POTENTIAL: Good   CLINICAL DECISION MAKING: Evolving/moderate complexity   EVALUATION COMPLEXITY: Moderate     GOALS: Goals reviewed with patient? Yes   SHORT TERM GOALS: Target date: 03/12/2022   Patient verbalizes & demonstrates understanding of HEP. Baseline: SEE OBJECTIVE DATA Goal status: INITIAL 2.  Patient reports donning prosthesis within 90 min of arising in mornings.  Baseline: SEE OBJECTIVE DATA Goal status: INITIAL   3.  Patient sit to/from stand from chairs without armrests using UEs & stabilizes correctly. Baseline: SEE OBJECTIVE DATA Goal status: INITIAL   4. Patient ambulates 20' with [facility] & prosthesis with minA. Baseline: SEE OBJECTIVE DATA Goal status: INITIAL     LONG TERM GOALS: Target date: 05/08/2022   Patient demonstrates & verbalized understanding of prosthetic care to enable safe utilization of prosthesis. Baseline: SEE OBJECTIVE DATA Goal status: INITIAL   Patient tolerates prosthesis wear >90% of awake hours without skin or limb pain issues. Baseline: SEE OBJECTIVE DATA Goal status: INITIAL   Berg Balance >/= 36/56 to indicate lower fall risk Baseline: SEE OBJECTIVE DATA Goal status: INITIAL   Patient ambulates >200' with prosthesis & SBQC modified independently Baseline: SEE OBJECTIVE DATA Goal status: INITIAL   Patient negotiates ramps, curbs & stairs with single rail with prosthesis & SBQC with family safely.  Baseline: SEE OBJECTIVE DATA Goal status: INITIAL   Patient verbalizes & demonstrates understanding of ongoing HEP Baseline: SEE  OBJECTIVE DATA Goal status: INITIAL   7.  Patient reports low back pain increases </= 2 increments with above activities. Baseline: SEE OBJECTIVE DATA Goal status: INITIAL   PLAN:   PT FREQUENCY: 1x/week   PT DURATION:  90 days    PLANNED INTERVENTIONS: Therapeutic exercises, Therapeutic activity, Neuromuscular re-education, Balance training, Gait training, Patient/Family education, Self Care, Stair training, Vestibular training, Prosthetic training, DME instructions, Manual therapy, Re-evaluation, and Physical performance testng.   PLAN FOR NEXT SESSION:  check how vascular procedure went,  continue with corner balance activities & add to HEP.  Check STGs.    Jamey Reas, PT, DPT 03/05/2022, 10:04 AM

## 2022-03-07 ENCOUNTER — Other Ambulatory Visit: Payer: Self-pay

## 2022-03-07 ENCOUNTER — Ambulatory Visit (HOSPITAL_COMMUNITY)
Admission: RE | Admit: 2022-03-07 | Discharge: 2022-03-07 | Disposition: A | Discharge status: Home / Self Care | Payer: Medicare Other | Attending: Vascular Surgery | Admitting: Vascular Surgery

## 2022-03-07 ENCOUNTER — Encounter (HOSPITAL_COMMUNITY)
Admission: RE | Disposition: A | Discharge status: Home / Self Care | Payer: Self-pay | Source: Home / Self Care | Attending: Vascular Surgery

## 2022-03-07 DIAGNOSIS — I70221 Atherosclerosis of native arteries of extremities with rest pain, right leg: Secondary | ICD-10-CM | POA: Insufficient documentation

## 2022-03-07 DIAGNOSIS — I739 Peripheral vascular disease, unspecified: Secondary | ICD-10-CM

## 2022-03-07 DIAGNOSIS — E1122 Type 2 diabetes mellitus with diabetic chronic kidney disease: Secondary | ICD-10-CM | POA: Insufficient documentation

## 2022-03-07 DIAGNOSIS — Z7902 Long term (current) use of antithrombotics/antiplatelets: Secondary | ICD-10-CM | POA: Diagnosis not present

## 2022-03-07 DIAGNOSIS — I129 Hypertensive chronic kidney disease with stage 1 through stage 4 chronic kidney disease, or unspecified chronic kidney disease: Secondary | ICD-10-CM | POA: Diagnosis not present

## 2022-03-07 DIAGNOSIS — Z89612 Acquired absence of left leg above knee: Secondary | ICD-10-CM | POA: Diagnosis not present

## 2022-03-07 DIAGNOSIS — Y832 Surgical operation with anastomosis, bypass or graft as the cause of abnormal reaction of the patient, or of later complication, without mention of misadventure at the time of the procedure: Secondary | ICD-10-CM | POA: Diagnosis not present

## 2022-03-07 DIAGNOSIS — N184 Chronic kidney disease, stage 4 (severe): Secondary | ICD-10-CM | POA: Insufficient documentation

## 2022-03-07 DIAGNOSIS — Z87891 Personal history of nicotine dependence: Secondary | ICD-10-CM | POA: Insufficient documentation

## 2022-03-07 DIAGNOSIS — E1151 Type 2 diabetes mellitus with diabetic peripheral angiopathy without gangrene: Secondary | ICD-10-CM | POA: Diagnosis present

## 2022-03-07 DIAGNOSIS — Z7982 Long term (current) use of aspirin: Secondary | ICD-10-CM | POA: Diagnosis not present

## 2022-03-07 DIAGNOSIS — T82856A Stenosis of peripheral vascular stent, initial encounter: Secondary | ICD-10-CM | POA: Diagnosis not present

## 2022-03-07 HISTORY — PX: PERIPHERAL VASCULAR INTERVENTION: CATH118257

## 2022-03-07 HISTORY — PX: ABDOMINAL AORTOGRAM W/LOWER EXTREMITY: CATH118223

## 2022-03-07 LAB — POCT I-STAT, CHEM 8
BUN: 37 mg/dL — ABNORMAL HIGH (ref 8–23)
Calcium, Ion: 0.89 mmol/L — CL (ref 1.15–1.40)
Chloride: 109 mmol/L (ref 98–111)
Creatinine, Ser: 1.5 mg/dL — ABNORMAL HIGH (ref 0.44–1.00)
Glucose, Bld: 165 mg/dL — ABNORMAL HIGH (ref 70–99)
HCT: 39 % (ref 36.0–46.0)
Hemoglobin: 13.3 g/dL (ref 12.0–15.0)
Potassium: 5.1 mmol/L (ref 3.5–5.1)
Sodium: 136 mmol/L (ref 135–145)
TCO2: 21 mmol/L — ABNORMAL LOW (ref 22–32)

## 2022-03-07 LAB — GLUCOSE, CAPILLARY: Glucose-Capillary: 178 mg/dL — ABNORMAL HIGH (ref 70–99)

## 2022-03-07 SURGERY — ABDOMINAL AORTOGRAM W/LOWER EXTREMITY
Anesthesia: LOCAL

## 2022-03-07 MED ORDER — SODIUM CHLORIDE 0.9 % IV SOLN: INTRAVENOUS | Status: DC

## 2022-03-07 MED ORDER — MIDAZOLAM HCL 2 MG/2ML IJ SOLN
INTRAMUSCULAR | Status: DC | PRN
  Administered 2022-03-07: 1 mg via INTRAVENOUS

## 2022-03-07 MED ORDER — FENTANYL CITRATE (PF) 100 MCG/2ML IJ SOLN
INTRAMUSCULAR | Status: AC
  Filled 2022-03-07: qty 2

## 2022-03-07 MED ORDER — FENTANYL CITRATE (PF) 100 MCG/2ML IJ SOLN
INTRAMUSCULAR | Status: DC | PRN
  Administered 2022-03-07: 25 ug via INTRAVENOUS
  Administered 2022-03-07: 50 ug via INTRAVENOUS

## 2022-03-07 MED ORDER — LABETALOL HCL 5 MG/ML IV SOLN
INTRAVENOUS | Status: DC | PRN
  Administered 2022-03-07 (×2): 10 mg via INTRAVENOUS

## 2022-03-07 MED ORDER — IODIXANOL 320 MG/ML IV SOLN
INTRAVENOUS | Status: DC | PRN
  Administered 2022-03-07: 110 mL via INTRA_ARTERIAL

## 2022-03-07 MED ORDER — LABETALOL HCL 5 MG/ML IV SOLN
INTRAVENOUS | Status: AC
  Filled 2022-03-07: qty 4

## 2022-03-07 MED ORDER — LIDOCAINE HCL (PF) 1 % IJ SOLN
INTRAMUSCULAR | Status: AC
  Filled 2022-03-07: qty 30

## 2022-03-07 MED ORDER — MIDAZOLAM HCL 5 MG/5ML IJ SOLN
INTRAMUSCULAR | Status: AC
  Filled 2022-03-07: qty 5

## 2022-03-07 MED ORDER — LABETALOL HCL 5 MG/ML IV SOLN: 10.0000 mg | INTRAVENOUS | Status: DC | PRN

## 2022-03-07 MED ORDER — LIDOCAINE HCL (PF) 1 % IJ SOLN
INTRAMUSCULAR | Status: DC | PRN
  Administered 2022-03-07: 10 mL
  Administered 2022-03-07: 2 mL

## 2022-03-07 MED ORDER — HYDRALAZINE HCL 20 MG/ML IJ SOLN: 5.0000 mg | INTRAMUSCULAR | Status: DC | PRN

## 2022-03-07 MED ORDER — HEPARIN SODIUM (PORCINE) 1000 UNIT/ML IJ SOLN
INTRAMUSCULAR | Status: AC
  Filled 2022-03-07: qty 10

## 2022-03-07 MED ORDER — ONDANSETRON HCL 4 MG/2ML IJ SOLN: 4.0000 mg | Freq: Four times a day (QID) | INTRAMUSCULAR | Status: DC | PRN

## 2022-03-07 MED ORDER — SODIUM CHLORIDE 0.9% FLUSH: 3.0000 mL | INTRAVENOUS | Status: DC | PRN

## 2022-03-07 MED ORDER — ACETAMINOPHEN 325 MG PO TABS: 650.0000 mg | ORAL_TABLET | ORAL | Status: DC | PRN

## 2022-03-07 MED ORDER — HEPARIN (PORCINE) IN NACL 1000-0.9 UT/500ML-% IV SOLN
INTRAVENOUS | Status: DC | PRN
  Administered 2022-03-07 (×2): 500 mL

## 2022-03-07 MED ORDER — CLOPIDOGREL BISULFATE 75 MG PO TABS
ORAL_TABLET | ORAL | Status: DC | PRN
  Administered 2022-03-07: 75 mg via ORAL

## 2022-03-07 MED ORDER — SODIUM CHLORIDE 0.9 % WEIGHT BASED INFUSION: 1.0000 mL/kg/h | INTRAVENOUS | Status: DC

## 2022-03-07 MED ORDER — CLOPIDOGREL BISULFATE 75 MG PO TABS
ORAL_TABLET | ORAL | Status: AC
  Filled 2022-03-07: qty 1

## 2022-03-07 MED ORDER — HEPARIN SODIUM (PORCINE) 1000 UNIT/ML IJ SOLN
INTRAMUSCULAR | Status: DC | PRN
  Administered 2022-03-07: 2000 [IU] via INTRAVENOUS
  Administered 2022-03-07: 6000 [IU] via INTRAVENOUS

## 2022-03-07 MED ORDER — SODIUM CHLORIDE 0.9 % IV SOLN: 250.0000 mL | INTRAVENOUS | Status: DC | PRN

## 2022-03-07 MED ORDER — SODIUM CHLORIDE 0.9% FLUSH: 3.0000 mL | Freq: Two times a day (BID) | INTRAVENOUS | Status: DC

## 2022-03-07 SURGICAL SUPPLY — 32 items
BALLN COYOTE OTW 2X220X150 (BALLOONS) ×2
BALLN STERLING OTW 3X100X150 (BALLOONS) ×2
BALLN STERLING OTW 5X150X150 (BALLOONS) ×2
BALLOON COYOTE OTW 2X220X150 (BALLOONS) IMPLANT
BALLOON STERLING OTW 3X100X150 (BALLOONS) IMPLANT
BALLOON STERLING OTW 5X150X150 (BALLOONS) IMPLANT
CATH OMNI FLUSH 5F 65CM (CATHETERS) IMPLANT
CATH QUICKCROSS ANG SELECT (CATHETERS) IMPLANT
CATH SYNTRAX .014X65 (CATHETERS) IMPLANT
CATH SYNTRAX .035X135 (CATHETERS) IMPLANT
DCB RANGER 4.0X200 150 (BALLOONS) IMPLANT
DEVICE CLOSURE MYNXGRIP 6/7F (Vascular Products) IMPLANT
GLIDEWIRE ADV .035X260CM (WIRE) IMPLANT
KIT ENCORE 26 ADVANTAGE (KITS) IMPLANT
KIT MICROPUNCTURE NIT STIFF (SHEATH) IMPLANT
KIT PV (KITS) ×2 IMPLANT
PATCH THROMBIX TOPICAL PLAIN (HEMOSTASIS) IMPLANT
RANGER DCB 4.0X200 150 (BALLOONS) ×2
SHEATH CATAPULT 6FR 45 (SHEATH) IMPLANT
SHEATH GLIDE SLENDER 4/5FR (SHEATH) IMPLANT
SHEATH PINNACLE 5F 10CM (SHEATH) IMPLANT
SHEATH PINNACLE 6F 10CM (SHEATH) IMPLANT
SHEATH PROBE COVER 6X72 (BAG) IMPLANT
STOPCOCK MORSE 400PSI 3WAY (MISCELLANEOUS) IMPLANT
SYR MEDRAD MARK 7 150ML (SYRINGE) ×2 IMPLANT
TRANSDUCER W/STOPCOCK (MISCELLANEOUS) ×2 IMPLANT
TRAY PV CATH (CUSTOM PROCEDURE TRAY) ×2 IMPLANT
TUBING CIL FLEX 10 FLL-RA (TUBING) IMPLANT
WIRE G V18X300CM (WIRE) IMPLANT
WIRE SHEPHERD 12G .014 (WIRE) IMPLANT
WIRE SPARTACORE .014X190CM (WIRE) IMPLANT
WIRE STARTER BENTSON 035X150 (WIRE) IMPLANT

## 2022-03-07 NOTE — Progress Notes (Signed)
Anderson Malta, RN notified of iCa 0.89.

## 2022-03-07 NOTE — Progress Notes (Signed)
Dr Virl Cagey notified of iCa and no new orders

## 2022-03-07 NOTE — H&P (Signed)
[facility] HISTORY AND PHYSICAL   Patient seen and examined in preop holding.  No complaints. No changes to medication history or physical exam since last seen in clinic. After discussing the risks and benefits of right leg angiogram for primary assisted patency of previously placed stents, Vicki Mallet elected to proceed.   [name] MD   History of Present Illness:  Patient is a 82 y.o. year old female who presents for evaluation of right LE PAD.  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   She reports that she has had right GT toe burning type pain. For a while.  Massaging it with lotion helps a little.  She more recently has dorsalis pedis access for treatment of in-stent right SFA stenosis as well as treatment of anterior tibial artery occlusion.  She remains on aspirin Plavix does have a severe allergy to statins now on Repatha.   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[place])    Claudication ([place])    Coronary artery disease    stent 2007   Depression    Diabetes mellitus    Difficulty sleeping    Diverticulosis 12/10/03   Elevated cholesterol    Generalized weakness    GERD (gastroesophageal reflux disease)    Heart murmur    Hepatitis    as an infant   HSV-1 (herpes simplex virus 1) infection    Hyperlipidemia    Hypertension    Hypothyroidism    Osteoarthritis    PAD (peripheral artery disease) ([place])    Personal history of chemotherapy    Personal history of radiation therapy    Pneumonia    2014 september and March 2022   Rheumatoid  arthritis ([place])    S/P aortic valve replacement with bioprosthetic valve 05/04/2020   Edwards Inspiris Resilia stented bovine pericardial tissue valve, size 23 mm   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   LIMA to D3, SVG to PDA, EVH via right thigh   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   Dr [name]   Spinal stenosis    Thyroid disease     Past Surgical History:  Procedure Laterality Date   ABDOMINAL AORTOGRAM W/LOWER EXTREMITY Bilateral 04/29/2019   Procedure: ABDOMINAL AORTOGRAM W/LOWER EXTREMITY;  Surgeon: Marty Heck, MD;  Location: [facility];  Service: Cardiovascular;  Laterality: Bilateral;   ABDOMINAL AORTOGRAM W/LOWER EXTREMITY Left 07/01/2019   Procedure: ABDOMINAL AORTOGRAM W/ Left LOWER EXTREMITY Runoff;  Surgeon: Marty Heck, MD;  Location: [facility];  Service: Cardiovascular;  Laterality: Left;   ABDOMINAL AORTOGRAM W/LOWER EXTREMITY Left 03/03/2021   Procedure: ABDOMINAL AORTOGRAM W/LOWER EXTREMITY;  Surgeon: Cherre Katricia Prehn, MD;  Location: [facility];  Service: Cardiovascular;  Laterality: Left;   ABDOMINAL AORTOGRAM W/LOWER EXTREMITY Right 06/12/2021   Procedure: ABDOMINAL AORTOGRAM W/LOWER EXTREMITY;  Surgeon: Waynetta Sandy, MD;  Location: [facility];  Service: Cardiovascular;  Laterality: Right;  LOWER LEG  AMPUTATION Left 03/06/2021   Procedure: LEFT ABOVE KNEE AMPUTATION;  Surgeon: [name], MD;  Location: [facility];  Service: Vascular;  Laterality: Left;   ANTERIOR AND POSTERIOR REPAIR N/A 04/05/2016   Procedure: ANTERIOR (CYSTOCELE) AND POSTERIOR REPAIR (RECTOCELE);  Surgeon: [name], MD;  Location: [facility];  Service: Gynecology;  Laterality: N/A;   AORTIC VALVE REPLACEMENT N/A 05/04/2020   Procedure: AORTIC VALVE REPLACEMENT (AVR) USING INSPIRIS 23MM RESILIA AORTIC VALVE;  Surgeon: Rexene Alberts, MD;  Location: [facility];  Service: Open Heart Surgery;  Laterality: N/A;   BREAST BIOPSY     BREAST LUMPECTOMY  1983    benign biopsy   BREAST LUMPECTOMY Right 12/29/2010   snbx, ER/PR +, Her2 -, 0/1 node pos.   CARPAL TUNNEL RELEASE Bilateral 9/12,8,12   CHOLECYSTECTOMY     COLONOSCOPY N/A 02/09/2013   Procedure: COLONOSCOPY;  Surgeon: [name], MD;  [facility]: [facility];  Service: Endoscopy;  Laterality: N/A;   COLONOSCOPY     CORONARY ANGIOPLASTY     CORONARY ARTERY BYPASS GRAFT N/A 05/04/2020   Procedure: CORONARY ARTERY BYPASS GRAFTING (CABG), ON PUMP, TIMES TWO, USING LEFT INTERNAL MAMMARY ARTERY AND RIGHT ENDOSCOPICALLY HARVESTED GREATER SAPHENOUS VEIN;  Surgeon: Rexene Alberts, MD;  Location: [facility];  Service: Open Heart Surgery;  Laterality: N/A;   DILATION AND CURETTAGE OF UTERUS     ESOPHAGOGASTRODUODENOSCOPY N/A 02/09/2013   Procedure: ESOPHAGOGASTRODUODENOSCOPY (EGD);  Surgeon: [name], MD;  Location: [name];  Service: Endoscopy;  Laterality: N/A;   FOOT SPURS     HYSTEROSCOPY WITH D & C N/A 09/11/2012   Procedure: DILATATION AND CURETTAGE ;  Surgeon: [name], MD;  Location: [facility];  Service: Gynecology;  Laterality: N/A;   KNEE SURGERY  2007   LAPAROSCOPIC ASSISTED VAGINAL HYSTERECTOMY N/A 04/05/2016   Procedure: LAPAROSCOPIC ASSISTED VAGINAL HYSTERECTOMY possible BSO;  Surgeon: [name], MD;  Location: [facility];  Service: Gynecology;  Laterality: N/A;   LUMBAR LAMINECTOMY/DECOMPRESSION MICRODISCECTOMY N/A 10/20/2014   Procedure: MICRO LUMBER DECOMPRESSION L3-4 L4-5;  Surgeon: [name], MD;  Location: [facility];  Service: Orthopedics;  Laterality: N/A;   LUMBAR LAMINECTOMY/DECOMPRESSION MICRODISCECTOMY Bilateral 10/13/2015   Procedure: MICRO LUMBAR DECOMPRESSION L5 - S1 AND REDO DECOMPRESSION L4 - L5 AND REMOVAL OF FACET CYST L4 - L5 2 LEVELS;  Surgeon: [name], MD;  Location: [facility];  Service: Orthopedics;  Laterality: Bilateral;   PERIPHERAL VASCULAR ATHERECTOMY Right 06/12/2021   Procedure: PERIPHERAL VASCULAR ATHERECTOMY;  Surgeon: Waynetta Sandy, MD;  Location: [facility];  Service: Cardiovascular;  Laterality: Right;  SFA/POP/AT   PERIPHERAL VASCULAR INTERVENTION Right 04/29/2019   Procedure: PERIPHERAL VASCULAR INTERVENTION;  Surgeon: Marty Heck, MD;  Location: [facility];  Service: Cardiovascular;  Laterality: Right;   PERIPHERAL VASCULAR INTERVENTION Right 06/12/2021   Procedure: PERIPHERAL VASCULAR INTERVENTION;  Surgeon: Waynetta Sandy, MD;  Location: [facility];  Service: Cardiovascular;  Laterality: Right;  SFP/POPLITEAL/AT   PORT-A-CATH REMOVAL  04/17/2011   Procedure: REMOVAL PORT-A-CATH;  Surgeon: [name], MD;  Location: [facility];  Service: General;  Laterality: Left;   PORTACATH PLACEMENT  02/07/2011   Procedure: INSERTION PORT-A-CATH;  Surgeon: [name], MD;  [facility];  Service: General;  Laterality: N/A;   RIGHT/LEFT HEART CATH AND CORONARY ANGIOGRAPHY N/A 03/17/2020   Procedure: RIGHT/LEFT HEART CATH AND CORONARY ANGIOGRAPHY;  Surgeon: Burnell Blanks, MD;  Location: [facility];  Service: Cardiovascular;  Laterality: N/A;   TEE WITHOUT CARDIOVERSION N/A 05/04/2020   Procedure: TRANSESOPHAGEAL ECHOCARDIOGRAM (TEE);  Surgeon: Rexene Alberts, MD;  Location: [facility];  Service: Open Heart Surgery;  Laterality: N/A;   UPPER GASTROINTESTINAL ENDOSCOPY      ROS:   General:  No weight loss, Fever, chills  HEENT: No recent headaches, no nasal bleeding, no visual changes, no sore throat  Neurologic: No dizziness, blackouts, seizures. No recent symptoms of stroke or mini- stroke. No recent episodes of slurred speech, or temporary blindness.  Cardiac: No recent episodes of chest pain/pressure, no shortness of breath at rest.  No shortness of breath with exertion.  Denies history of atrial fibrillation or irregular heartbeat  Vascular: No history of rest pain in feet.  positive history of claudication.  Positive history of  non-healing ulcer, No history of DVT   Pulmonary: No home oxygen, no productive cough, no hemoptysis,  No asthma or wheezing  Musculoskeletal:  [ ]$  Arthritis, [ ]$  Low back pain,  [ ]$  Joint pain  Hematologic:No history of hypercoagulable state.  No history of easy bleeding.  No history of anemia  Gastrointestinal: No hematochezia or melena,  No gastroesophageal reflux, no trouble swallowing  Urinary: [ ]$  chronic Kidney disease, [ ]$  on HD - [ ]$  MWF or [ ]$  TTHS, [ ]$  Burning with urination, [ ]$  Frequent urination, [ ]$  Difficulty urinating;   Skin: No rashes  Psychological: No history of anxiety,  No history of depression  Social History Social History   Tobacco Use   Smoking status: Former    Packs/day: 2.00    Years: 40.00    Total pack years: 80.00    Types: Cigarettes    Quit date: 12/19/1997    Years since quitting: 24.2   Smokeless tobacco: Never  Vaping Use   Vaping Use: Never used  Substance Use Topics   Alcohol use: Yes    Alcohol/week: 1.0 standard drink of alcohol    Types: 1 Glasses of wine per week    Comment: occasiona - maybe once a month   Drug use: No    Family History Family History  Problem Relation Age of Onset   Kidney disease Mother    Heart disease Mother    Throat cancer Father    Alcoholism Father    Heart attack Father    Lymphoma Brother 22   Heart attack Brother    Diabetes Brother    Hypertension Brother    Cancer Son    Colon cancer Neg Hx    Stroke Neg Hx    Esophageal cancer Neg Hx    Rectal cancer Neg Hx    Stomach cancer Neg Hx     Allergies  Allergies  Allergen Reactions   Codeine Nausea And Vomiting and Other (See Comments)    Severe stomach cramps, also Other reaction(s): Unknown   Antihistamines, Diphenhydramine-Type Other (See Comments)    Causes hyperactivity   Diphenhydramine     Other reaction(s): Unknown   Folic Acid Other (See Comments)    Other reaction(s): tongue pain   Gabapentin     Urinary incontinence    Lyrica [Pregabalin]     Double vision/falls   Methotrexate Other (See Comments)    Other reaction(s): oral ulcers   Oxycodone     Hallucinations/crazy feelings   Rheumate [Fish Oil]     Other reaction(s): hypoglycemia   Statins Other (See Comments)    Joint pains Other reaction(s): Unknown   Sulfa Antibiotics  Other (See Comments)    Reaction not recalled   Erythromycin Hives, Rash and Other (See Comments)    Allergic due to dental work from 50 years ago. It was used in packing and resulted in rash/hives inside and outside of mouth.   Trulicity [Dulaglutide] Itching     Current Facility-Administered Medications  Medication Dose Route Frequency Provider Last Rate Last Admin   0.9 %  sodium chloride infusion   Intravenous Continuous [name], MD 100 mL/hr at 03/07/22 0741 New Bag at 03/07/22 0741    Physical Examination  Vitals:   03/07/22 0742  BP: (!) 141/75  Pulse: 73  Resp: 16  Temp: 97.6 F (36.4 C)  TempSrc: Oral  SpO2: 95%  Weight: 65.8 kg  Height: 5' 3"$  (1.6 m)    Body mass index is 25.69 kg/m.  General:  Alert and oriented, no acute distress HEENT: Normal Neck: No bruit or JVD Pulmonary: Clear to auscultation bilaterally Cardiac: Regular Rate and Rhythm without murmur Abdomen: Soft, non-tender, non-distended, no mass, no scars Skin: No rash, no ischemic changes right LE Extremity Pulses:  2+ radial,  femoral pulses bilaterally Musculoskeletal: No deformity or edema, right AKA prothesis  Neurologic: Upper and lower extremity motor grossly intact and symmetric  DATA:  ABI Findings:  +---------+------------------+-----+----------+--------+  Right   Rt Pressure (mmHg)IndexWaveform  Comment   +---------+------------------+-----+----------+--------+  Brachial 168                                        +---------+------------------+-----+----------+--------+  PTA     77                0.46 monophasic           +---------+------------------+-----+----------+--------+  DP      67                0.40 monophasic          +---------+------------------+-----+----------+--------+  Great Toe26                0.15                     +---------+------------------+-----+----------+--------+   +--------+------------------+-----+--------+-------+  Left   Lt Pressure (mmHg)IndexWaveformComment  +--------+------------------+-----+--------+-------+  [id number]                                    +--------+------------------+-----+--------+-------+  PTA                                   AKA      +--------+------------------+-----+--------+-------+  DP                                    AKA      +--------+------------------+-----+--------+-------+   +-------+-----------+-----------+------------+------------+  ABI/TBIToday's ABIToday's TBIPrevious ABIPrevious TBI  +-------+-----------+-----------+------------+------------+  Right 0.46       0.15       0.88        0.37          +-------+-----------+-----------+------------+------------+  Left  AKA        AKA        AKA         AKA           +-------+-----------+-----------+------------+------------+  Right ABIs and TBIs appear decreased compared to prior study on 07/26/2021.    Summary:  Right: Resting right ankle-brachial index indicates severe right lower  extremity arterial disease. The right toe-brachial index is abnormal.   +-----------+--------+-----+---------------+----------+--------+  RIGHT     PSV cm/sRatioStenosis       Waveform  Comments  +-----------+--------+-----+---------------+----------+--------+  CFA Distal 109                         biphasic            +-----------+--------+-----+---------------+----------+--------+  DFA       125                         biphasic            +-----------+--------+-----+---------------+----------+--------+  SFA Prox   63                           triphasic           +-----------+--------+-----+---------------+----------+--------+  POP Prox   41                                              +-----------+--------+-----+---------------+----------+--------+  ATA Prox   43                          monophasic          +-----------+--------+-----+---------------+----------+--------+  ATA Mid    49                          monophasic          +-----------+--------+-----+---------------+----------+--------+  ATA Distal 19                          monophasic          +-----------+--------+-----+---------------+----------+--------+  PTA Prox   312          50-74% stenosismonophasic          +-----------+--------+-----+---------------+----------+--------+  PTA Mid    42                          monophasic          +-----------+--------+-----+---------------+----------+--------+  PTA Distal 16                          monophasic          +-----------+--------+-----+---------------+----------+--------+  PERO Distal52                          monophasic          +-----------+--------+-----+---------------+----------+--------+       Right Stent(s):  +--------------------+--------+---------------+----------+--------+  SFA-popliteal arteryPSV cm/sStenosis       Waveform  Comments  +--------------------+--------+---------------+----------+--------+  Prox to Stent       52                     triphasic           +--------------------+--------+---------------+----------+--------+  Proximal Stent      64  triphasic           +--------------------+--------+---------------+----------+--------+  Mid Stent           39                     triphasic           +--------------------+--------+---------------+----------+--------+  Distal Stent        335     50-99% stenosismonophasic           +--------------------+--------+---------------+----------+--------+  Distal to Stent     138                    monophasic          +--------------------+--------+---------------+----------+--------+      Summary:  Right: 50-99 stenosis is noted within the distal SFA-popliteal artery  stent. 50-74% stenosis noted in the proximal posterior tibial artery.  Right anterior tibial artery stent was unable to be visualized, however,  the artery appears patent.   ASSESSMENT/PLAN:  Patient is a 82 y.o. year old female who presents for evaluation of right LE PAD.  Most recent intervention was   Laser atherectomy of in-stent restenosis right SFA, occluded right popliteal artery and occluded anterior tibial artery with 1.34m [place]  Drug-coated balloon angioplasty right SFA in-stent restenosis with 6 mm Ranger  Stent of right SFA and popliteal with 5 x 150 mm bio mimics and stent of right anterior tibial artery with 3.5 x 414mSynergy with CO2.  By Dr. CaDonzetta Mattersn 06/12/21 for CLI without tissue loss.   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    She reports that she has had right GT toe burning type pain.   She states this pain has been present since May.    He ABI's and Duplex show stenosis in the stent.  The combination of stenosis and her GT toe symptoms  need further intervention with a repeat angiogram and possible intervention.       PA-C [facility] [phone] in clinic [place]

## 2022-03-07 NOTE — Op Note (Signed)
Patient name: [name] MRN: [id number] DOB: Dec 11, 1940 Sex: female  03/07/2022 Pre-operative Diagnosis: Right lower extremity critical limb ischemia with rest pain Post-operative diagnosis:  Same Surgeon:  [name], MD Procedure Performed: 1.  Ultrasound-guided micropuncture access of the left common femoral artery 2.  Aortogram 3.  Second-order cannulation, right lower extremity angiogram 4.  Balloon angioplasty 5 x 200 DCB of previous superficial femoral, popliteal stents 5.  Balloon angioplasty 3 x 150 of previous anterior tibial artery, popliteal stents 6.  Ultrasound-guided micropuncture access of the dorsalis pedis artery 7.  2 x 200 mm balloon angioplasty of the anterior tibial artery 8.  Moderate sedation time 101 minutes, contrast volume 110 mL   Indications: Patient is a 82 year old female with longstanding history of PAD with left-sided above-knee amputation for no unreconstructable vascular disease.  She has had multiple interventions on the right lower extremity including SFA, popliteal, anterior tibial artery stenting, and when last seen, had single-vessel anterior tibial runoff to the foot.  She presents today with a several-month history of rest pain in the right lower extremity.  This is keeping her up at night.  After discussing the risk and benefits of right lower extremity angiogram in an effort to define, and improve distal perfusion to alleviate rest pain, [name] elected to proceed.  Findings:  Aortogram: No flow-limiting stenosis appreciated in the aortoiliac segments bilaterally On the right: Widely patent common femoral artery, widely patent profunda.  The superficial femoral artery has been stented in its entirety, including the popliteal artery.  There are multiple, flow-limiting lesions, with an area of near occlusion at the level of the popliteal artery.  Distally, there is an area of native vessel in the P3 segment of the popliteal artery followed  by anterior tibial artery stenting.  This segment of native vessel has greater than 90% stenosis, the anterior tibial artery stent is nearly occluded as well.  Distally, the anterior tibial artery occludes a few centimeters after the stent.  There are no named tibial vessels with reconstitution of the anterior tibial artery at the level of the ankle, which continues into the foot is the dorsalis pedis.  Runoff from the dorsalis pedis was unable to be viewed due to the poor perfusion.    Procedure:  The patient was identified in the holding area and taken to room 8.  The patient was then placed supine on the table and prepped and draped in the usual sterile fashion.  A time out was called.  Moderate sedation was administered being fentanyl and Versed.  I was present for the entirety of moderate sedation.  Patient tolerated well.  Ultrasound was used to evaluate the left common femoral artery.  It was patent .  A digital ultrasound image was acquired.  A micropuncture needle was used to access the left common femoral artery under ultrasound guidance.  An 018 wire was advanced without resistance and a micropuncture sheath was placed.  The 018 wire was removed and a benson wire was placed.  The micropuncture sheath was exchanged for a 5 french sheath.  An omniflush catheter was advanced over the wire to the level of L-1.  An abdominal angiogram was obtained.  Next, using the omniflush catheter and a benson wire, the aortic bifurcation was crossed and the catheter was placed into theright external iliac artery and right runoff was obtained.  The results above.  I elected to attempt intervention on the in-stent restenosis which was present throughout the SFA,  popliteal, anterior tibial artery stents.  I also elected to attempt intervention on the anterior tibial artery, which was occluded for over 15 cm.  A 6 x 45 cm sheath was brought onto the field and parked in the right superficial femoral artery.  Using a  series of wires and catheters, the superficial femoral artery and popliteal artery stents were crossed.  Next, a 5 x 200 mm drug-coated balloon was brought onto the field and expanded along the entirety of the stents.  Next, I exchanged the 035 wire for an 018 wire and crossed the P3 segment of the popliteal artery, into the anterior tibial artery.  True lumen was confirmed distally, there was no outflow, but I elected to balloon angioplasty the stent in the P3 segment of the popliteal artery as there was severe stenosis.  This was done using a 5 x 150 mm balloon.  There was an excellent result.  I used a series of wires and catheters of various sizes, but was unable to run a wire into the foot through the anterior tibial artery.  I elected to attempt pedal artery access, and was able to access the right dorsalis pedis artery in retrograde fashion.  A wire was run through the anterior tibial artery and I was able to cannulate the contralateral sheath.  The wire was externalized.  From the foot, I used a 2 x 200 mm balloon which was expanded for 3 minutes.  Follow-up angiography demonstrated excellent result with recanalization of the anterior tibial artery to the level of the sheath.  This was occlusive, and therefore outflow could not be examined.  I elected at this point to end the case.   Impression: Successful balloon angioplasty of in-stent restenosis involving the superficial femoral artery, popliteal artery, anterior tibial artery stents.  Successful recanalization of the anterior tibial artery from retrograde access of the dorsalis pedis artery.  [name] had an excellent dorsalis pedis signal at case completion.    [name], MD [facility] Office: [phone]

## 2022-03-07 NOTE — Discharge Instructions (Signed)
Femoral Site Care This sheet gives you information about how to care for yourself after your procedure. Your health care provider may also give you more specific instructions. If you have problems or questions, contact your health care provider. What can I expect after the procedure?  After the procedure, it is common to have: Bruising that usually fades within 1-2 weeks. Tenderness at the site. Follow these instructions at home: Wound care Follow instructions from your health care provider about how to take care of your insertion site. Make sure you: Wash your hands with soap and water before you change your bandage (dressing). If soap and water are not available, use hand sanitizer. Remove your dressing as told by your health care provider. In 24 hours Do not take baths, swim, or use a hot tub until your health care provider approves. You may shower 24-48 hours after the procedure or as told by your health care provider. Gently wash the site with plain soap and water. Pat the area dry with a clean towel. Do not rub the site. This may cause bleeding. Do not apply powder or lotion to the site. Keep the site clean and dry. Check your femoral site every day for signs of infection. Check for: Redness, swelling, or pain. Fluid or blood. Warmth. Pus or a bad smell. Activity For the first 2-3 days after your procedure, or as long as directed: Avoid climbing stairs as much as possible. Do not squat. Do not lift anything that is heavier than 10 lb (4.5 kg), or the limit that you are told, until your health care provider says that it is safe. For 5 days Rest as directed. Avoid sitting for a long time without moving. Get up to take short walks every 1-2 hours. Do not drive for 24 hours if you were given a medicine to help you relax (sedative). General instructions Take over-the-counter and prescription medicines only as told by your health care provider. Keep all follow-up visits as told by  your health care provider. This is important. Contact a health care provider if you have: A fever or chills. You have redness, swelling, or pain around your insertion site. Get help right away if: The catheter insertion area swells very fast. You pass out. You suddenly start to sweat or your skin gets clammy. The catheter insertion area is bleeding, and the bleeding does not stop when you hold steady pressure on the area. The area near or just beyond the catheter insertion site becomes pale, cool, tingly, or numb. These symptoms may represent a serious problem that is an emergency. Do not wait to see if the symptoms will go away. Get medical help right away. Call your local emergency services (911 in the [place] Do not drive yourself to the hospital. Summary After the procedure, it is common to have bruising that usually fades within 1-2 weeks. Check your femoral site every day for signs of infection. Do not lift anything that is heavier than 10 lb (4.5 kg), or the limit that you are told, until your health care provider says that it is safe. This information is not intended to replace advice given to you by your health care provider. Make sure you discuss any questions you have with your health care provider. Document Revised: 01/21/2017 Document Reviewed: 01/21/2017 [employer]vier Patient Education  2020 [employer].

## 2022-03-08 ENCOUNTER — Encounter (HOSPITAL_COMMUNITY): Payer: Self-pay | Admitting: Vascular Surgery

## 2022-03-09 ENCOUNTER — Telehealth: Payer: Self-pay

## 2022-03-09 NOTE — Telephone Encounter (Signed)
Pt called stating that Dr. Virl Cagey did a procedure on her leg on Wednesday, 2/14. She has some cracks and splits on her heels and wanted to soak them in epsom salt.  Reviewed pt's chart, returned call for clarification, two identifiers used. Pt stated that her foot was swollen. Instructed her how to elevate properly to help alleviate some of the swelling so that wouldn't be contributing to the heel splits. Instructed her to only use enough water to soak the heel, making sure to keep the incision site out of the water. Advised adding some vasoline to the cracks to help soften the areas for quicker healing. Confirmed understanding.

## 2022-03-12 ENCOUNTER — Encounter: Payer: Self-pay | Admitting: Physical Therapy

## 2022-03-12 ENCOUNTER — Ambulatory Visit (INDEPENDENT_AMBULATORY_CARE_PROVIDER_SITE_OTHER): Payer: Medicare Other | Admitting: Physical Therapy

## 2022-03-12 DIAGNOSIS — M25652 Stiffness of left hip, not elsewhere classified: Secondary | ICD-10-CM

## 2022-03-12 DIAGNOSIS — R2689 Other abnormalities of gait and mobility: Secondary | ICD-10-CM

## 2022-03-12 DIAGNOSIS — R2681 Unsteadiness on feet: Secondary | ICD-10-CM | POA: Diagnosis not present

## 2022-03-12 DIAGNOSIS — R293 Abnormal posture: Secondary | ICD-10-CM

## 2022-03-12 DIAGNOSIS — M6281 Muscle weakness (generalized): Secondary | ICD-10-CM | POA: Diagnosis not present

## 2022-03-12 NOTE — Therapy (Signed)
OUTPATIENT PHYSICAL THERAPY TREATMENT NOTE   Patient Name: [name] MRN: [id number] DOB:1940/03/13, 82 y.o., female Today's Date: 03/12/2022  PCP: [name], MD  REFERRING PROVIDER: [name], MD   END OF SESSION:   PT End of Session - 03/12/22 0916     Visit Number 5    Number of Visits 14    Date for PT Re-Evaluation 05/08/22    Authorization Type [employer] Medicare    Authorization Time Period no co-pay, no visit limit    Progress Note Due on Visit 10    PT Start Time 0916    PT Stop Time 1002    PT Time Calculation (min) 46 min    Equipment Utilized During Treatment Gait belt    Activity Tolerance Patient tolerated treatment well    Behavior During Therapy WFL for tasks assessed/performed                Past Medical History:  Diagnosis Date   Allergy    Anemia    childhood   Anxiety    Aortic stenosis    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   Arthritis    Breast cancer ([place]) 12/05/10   R breast, inv mammary, in situ,ER/PR +,HER2 -   Cancer ([place])    Carcinoma of breast treated with adjuvant chemotherapy ([place])    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[place])    Claudication ([place])    Coronary artery disease    stent 2007   Depression    Diabetes mellitus    Difficulty sleeping    Diverticulosis 12/10/03   Elevated cholesterol    Generalized weakness    GERD (gastroesophageal reflux disease)    Heart murmur    Hepatitis    as an infant   HSV-1 (herpes simplex virus 1) infection    Hyperlipidemia    Hypertension    Hypothyroidism    Osteoarthritis    PAD (peripheral artery disease) ([place])    Personal history of chemotherapy    Personal history of radiation therapy    Pneumonia    2014 september and March 2022   Rheumatoid arthritis ([place])    S/P aortic valve replacement with bioprosthetic valve 05/04/2020   [facility] Inspiris Resilia stented bovine pericardial tissue valve, size 23 mm   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   LIMA to D3, SVG to  PDA, EVH via right thigh   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   Dr [name]   Spinal stenosis    Thyroid disease    Past Surgical History:  Procedure Laterality Date   ABDOMINAL AORTOGRAM W/LOWER EXTREMITY Bilateral 04/29/2019   Procedure: ABDOMINAL AORTOGRAM W/LOWER EXTREMITY;  Surgeon: Marty Heck, MD;  Location: [facility];  Service: Cardiovascular;  Laterality: Bilateral;   ABDOMINAL AORTOGRAM W/LOWER EXTREMITY Left 07/01/2019   Procedure: ABDOMINAL AORTOGRAM W/ Left LOWER EXTREMITY [place];  Surgeon: Marty Heck, MD;  Location: [facility];  Service: Cardiovascular;  Laterality: Left;   ABDOMINAL AORTOGRAM W/LOWER EXTREMITY Left 03/03/2021   Procedure: ABDOMINAL AORTOGRAM W/LOWER EXTREMITY;  Surgeon: Cherre Robins, MD;  Location: [facility];  Service: Cardiovascular;  Laterality: Left;   ABDOMINAL AORTOGRAM W/LOWER EXTREMITY Right 06/12/2021   Procedure: ABDOMINAL AORTOGRAM W/LOWER EXTREMITY;  Surgeon: Waynetta Sandy, MD;  Location: [facility];  Service: Cardiovascular;  Laterality: Right;  LOWER LEG   ABDOMINAL AORTOGRAM W/LOWER EXTREMITY N/A 03/07/2022   Procedure: ABDOMINAL  AORTOGRAM W/LOWER EXTREMITY;  Surgeon: [name], MD;  Location: [facility];  Service: Cardiovascular;  Laterality: N/A;   AMPUTATION Left 03/06/2021   Procedure: LEFT ABOVE KNEE AMPUTATION;  Surgeon: [name], MD;  Location: [facility];  Service: Vascular;  Laterality: Left;   ANTERIOR AND POSTERIOR REPAIR N/A 04/05/2016   Procedure: ANTERIOR (CYSTOCELE) AND POSTERIOR REPAIR (RECTOCELE);  Surgeon: [name], MD;  Location: [facility];  Service: Gynecology;  Laterality: N/A;   AORTIC VALVE REPLACEMENT N/A 05/04/2020   Procedure: AORTIC VALVE REPLACEMENT (AVR) USING INSPIRIS 23MM RESILIA AORTIC VALVE;  Surgeon: Rexene Alberts, MD;  Location: [facility];  Service: Open Heart Surgery;  Laterality: N/A;   BREAST BIOPSY     BREAST LUMPECTOMY  1983    benign biopsy   BREAST LUMPECTOMY Right 12/29/2010   snbx, ER/PR +, Her2 -, 0/1 node pos.   CARPAL TUNNEL RELEASE Bilateral 9/12,8,12   CHOLECYSTECTOMY     COLONOSCOPY N/A 02/09/2013   Procedure: COLONOSCOPY;  Surgeon: [name], MD;  [facility];  Service: Endoscopy;  Laterality: N/A;   COLONOSCOPY     CORONARY ANGIOPLASTY     CORONARY ARTERY BYPASS GRAFT N/A 05/04/2020   Procedure: CORONARY ARTERY BYPASS GRAFTING (CABG), ON PUMP, TIMES TWO, USING LEFT INTERNAL MAMMARY ARTERY AND RIGHT ENDOSCOPICALLY HARVESTED GREATER SAPHENOUS VEIN;  Surgeon: Rexene Alberts, MD;  Location: [facility];  Service: Open Heart Surgery;  Laterality: N/A;   DILATION AND CURETTAGE OF UTERUS     ESOPHAGOGASTRODUODENOSCOPY N/A 02/09/2013   Procedure: ESOPHAGOGASTRODUODENOSCOPY (EGD);  Surgeon: [name], MD;  Location: [name];  Service: Endoscopy;  Laterality: N/A;   FOOT SPURS     HYSTEROSCOPY WITH D & C N/A 09/11/2012   Procedure: DILATATION AND CURETTAGE ;  Surgeon: [name], MD;  Location: [facility];  Service: Gynecology;  Laterality: N/A;   KNEE SURGERY  2007   LAPAROSCOPIC ASSISTED VAGINAL HYSTERECTOMY N/A 04/05/2016   Procedure: LAPAROSCOPIC ASSISTED VAGINAL HYSTERECTOMY possible BSO;  Surgeon: [name], MD;  Location: [facility];  Service: Gynecology;  Laterality: N/A;   LUMBAR LAMINECTOMY/DECOMPRESSION MICRODISCECTOMY N/A 10/20/2014   Procedure: MICRO LUMBER DECOMPRESSION L3-4 L4-5;  Surgeon: [name], MD;  [facility];  Service: Orthopedics;  Laterality: N/A;   LUMBAR LAMINECTOMY/DECOMPRESSION MICRODISCECTOMY Bilateral 10/13/2015   Procedure: MICRO LUMBAR DECOMPRESSION L5 - S1 AND REDO DECOMPRESSION L4 - L5 AND REMOVAL OF FACET CYST L4 - L5 2 LEVELS;  Surgeon: [name], MD;  [facility];  Service: Orthopedics;  Laterality: Bilateral;   PERIPHERAL VASCULAR ATHERECTOMY Right 06/12/2021   Procedure: PERIPHERAL VASCULAR ATHERECTOMY;  Surgeon: Waynetta Sandy, MD;  Location: [facility];  Service: Cardiovascular;  Laterality: Right;  SFA/POP/AT   PERIPHERAL VASCULAR INTERVENTION Right 04/29/2019   Procedure: PERIPHERAL VASCULAR INTERVENTION;  Surgeon: Marty Heck, MD;  Location: [facility];  Service: Cardiovascular;  Laterality: Right;   PERIPHERAL VASCULAR INTERVENTION Right 06/12/2021   Procedure: PERIPHERAL VASCULAR INTERVENTION;  Surgeon: Waynetta Sandy, MD;  Location: [facility];  Service: Cardiovascular;  Laterality: Right;  SFP/POPLITEAL/AT   PERIPHERAL VASCULAR INTERVENTION  03/07/2022   Procedure: PERIPHERAL VASCULAR INTERVENTION;  Surgeon: [name], MD;  Location: [facility];  Service: Cardiovascular;;   PORT-A-CATH REMOVAL  04/17/2011   Procedure: REMOVAL PORT-A-CATH;  Surgeon: [name], MD;  Location: [facility];  Service: General;  Laterality: Left;   PORTACATH PLACEMENT  02/07/2011   Procedure:  INSERTION PORT-A-CATH;  Surgeon: [name], MD;  [facility];  Service: General;  Laterality: N/A;   RIGHT/LEFT HEART CATH AND CORONARY ANGIOGRAPHY N/A 03/17/2020   Procedure: RIGHT/LEFT HEART CATH AND CORONARY ANGIOGRAPHY;  Surgeon: Burnell Blanks, MD;  Location: [facility];  Service: Cardiovascular;  Laterality: N/A;   TEE WITHOUT CARDIOVERSION N/A 05/04/2020   Procedure: TRANSESOPHAGEAL ECHOCARDIOGRAM (TEE);  Surgeon: Rexene Alberts, MD;  Location: [facility];  Service: Open Heart Surgery;  Laterality: N/A;   UPPER GASTROINTESTINAL ENDOSCOPY     Patient Active Problem List   Diagnosis Date Noted   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[place]) 03/13/2021   Sinus tachycardia 03/11/2021   Elevated troponin 03/08/2021   Stroke-like symptoms 03/08/2021   Anemia of chronic disease 03/08/2021   Acute supraglottitis with epiglottitis 02/05/2021   Epiglottitis 02/05/2021   Reactive airway disease 01/20/2021   Diabetic peripheral  neuropathy ([place]) 12/19/2020   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) 04/07/2019   Low back pain 02/03/2019   Scoliosis deformity of spine 02/03/2019   Osteoarthritis of hip 11/17/2018   Claudication ([place])    Chronic headaches 11/12/2017   Degeneration of lumbar intervertebral disc 06/13/2017   Hyperkalemia 06/03/2017   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[place])    Mass of left adrenal gland ([place]) 04/19/2017   Spinal stenosis of lumbar region 10/20/2014   Hyperlipidemia 03/16/2013   Dense breasts 12/22/2012   Anxiety state 04/16/2012   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/16/2012   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   Osteoarthritis of knee 04/16/2012   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 female breast ([place]) 12/20/2010   History of breast cancer 12/05/2010   Depression 09/14/2008   GERD 09/14/2008   Type 2 diabetes mellitus with neurologic complication, with long-term current use of insulin ([place]) 05/20/2008   Coronary atherosclerosis 04/17/2007   Hypothyroidism 04/03/2007    REFERRING DIAG: [id number] (ICD-10-CM) - Above knee amputation of left lower extremity   ONSET DATE: 02/02/2022 MD referral to PT   THERAPY DIAG:  Other abnormalities of gait and mobility  Unsteadiness on feet  Muscle weakness (generalized)  Stiffness of left hip, not elsewhere classified  Abnormal posture  Rationale for Evaluation and Treatment Rehabilitation  PERTINENT HISTORY: Left TFA, DM2, neuropathy, PVD, CKD st4, RLE stent, aortic stenosis, CAD, CABG X 2, HTN, OA, lumbar spinal stenosis, breast CA   PRECAUTIONS: Fall  SUBJECTIVE:  SUBJECTIVE STATEMENT:  The vascular procedure went well. MD said that if it happens again he may have to amputate.  She has a crack on heel that was there before procedure.    PAIN:  Are you having pain? Yes: NPRS scale: today  0/10 and in last week 0/10, when doing stretch of hip flexors 6/10 Pain location: right lumbar  Pain description: achy Aggravating factors: walking without shoes with rollator Relieving factors: sit down   OBJECTIVE: (objective measures completed at initial evaluation unless otherwise dated)   COGNITION: Overall cognitive status: Within functional limits for tasks assessed   MUSCLE LENGTH: Hamstrings:  Thomas test: Left -37 deg   POSTURE: rounded shoulders, forward head, increased lumbar lordosis, increased thoracic kyphosis, flexed trunk , and weight shift right   LOWER EXTREMITY ROM:   ROM P:passive  A:active Right eval Left eval  Hip flexion      Hip extension Standing -27* excessive lordosis    Hip abduction      Hip adduction      Hip internal rotation      Hip external rotation      Knee flexion      Knee extension      Ankle dorsiflexion      Ankle plantarflexion      Ankle inversion      Ankle eversion       (Blank rows = not tested)   LOWER EXTREMITY MMT:   MMT Right eval Left eval  Hip flexion      Hip extension      Hip abduction      Hip adduction      Hip internal rotation      Hip external rotation      Knee flexion      Knee extension      Ankle dorsiflexion      Ankle plantarflexion      Ankle inversion      Ankle eversion      (Blank rows = not tested)   TRANSFERS: Sit to stand: SBA requires UE assist on armrest, prosthetic knee locked delayed in process impairing stabilization upon arising. Uses back of legs against chair & external support to stabilize Stand to sit: SBA uses back of legs against  chair for stability. External support required & armrests on chair.    GAIT: Gait pattern: step to pattern, decreased arm swing- Left, decreased step length- Right, decreased stance time- Left, circumduction- Left, Left hip hike, antalgic, lateral hip instability, trunk flexed, and abducted- Left Distance walked: 100' Assistive device utilized: Lobbyist with HHA (required) and Walker - 4 wheeled Level of assistance: Mod A with SBQC / HHA and SBA / verbal cues for rollator Gait velocity: 1.22 ft/sec with rollator walker   FUNCTIONAL TESTs:  Eval (02/07/2022): Timed up and go (TUG): 28.97 sec with rollator Berg Balance Scale: 21/56   [facility] PT Assessment - 02/07/22 1100                Standardized Balance Assessment    Standardized Balance Assessment Berg Balance Test;Timed Up and Go Test          Berg Balance Test    Sit to Stand Able to stand  independently using hands     Standing Unsupported Able to stand safely 2 minutes     Sitting with Back Unsupported but Feet Supported on Floor or Stool Able to sit safely and securely 2 minutes     Stand  to Sit Controls descent by using hands     Transfers Able to transfer safely, definite need of hands     Standing Unsupported with Eyes Closed Able to stand 3 seconds     Standing Unsupported with Feet Together Needs help to attain position but able to stand for 30 seconds with feet together     From Standing, Reach Forward with Outstretched Arm Reaches forward but needs supervision     From Standing Position, Pick up Object from Floor Unable to try/needs assist to keep balance     From Standing Position, Turn to Look Behind Over each Shoulder Needs assist to keep from losing balance and falling     Turn 360 Degrees Needs assistance while turning     Standing Unsupported, Alternately Place Feet on Step/Stool Needs assistance to keep from falling or unable to try     Standing Unsupported, One Foot in [employer] balance while stepping  or standing     Standing on One Leg Unable to try or needs assist to prevent fall     Total Score 21     Berg comment: < 36 high risk for falls (close to 100%) 46-51 moderate (>50%)   37-45 significant (>80%) 52-55 lower (> 25%)          Timed Up and Go Test    Normal TUG (seconds) 28.97   rollator walker & prosthesis    TUG Comments TUG:  Normal: >13.5 sec indicates high fall risk  Manual: > 14.5 sec indicates high fall risk  Cognitive: >15 sec indicates high fall risk                   CURRENT PROSTHETIC WEAR ASSESSMENT:  Patient is independent with: skin check, residual limb care, and prosthetic cleaning Patient is dependent with: correct ply sock adjustment, proper wear schedule/adjustment, and proper weight-bearing schedule/adjustment Donning prosthesis: Modified independence per pt report Doffing prosthesis: Modified independence per pt report Prosthetic wear tolerance: ~8 hours/day, 7 days/week (~60-70% of awake hours) Prosthetic weight bearing tolerance: 8 minutes with no c/o limb pain Residual limb condition: pt reports no issues Prosthetic description: silicon liner with suction ring suspension, ischial containment socket with flexible inner socket, non-hydraulic knee,        TODAY'S TREATMENT:                                                                                                                             DATE:  03/12/2022: Prosthetic Training with Transfemoral prosthesis: PT instructed in standing still (stationary) activities to have prosthetic toes back 1-2" to right toes, ASIS (headlights) facing forward and touch LUE as needed to facilitate weight on prosthesis.  Pt verbalized understanding.    Therapeutic Activities: PT instructed in Bag Balm vs Vasoline that she is currently using. Pt verbalized understanding. PT demo & verbal cues on proper elevation and doing alphabet with ankle for muscle activity 3 times during 10-15 elevation.  Pt verbalized  understanding.    Neuromuscular Re-education for balance Standing in corner with chair back anterior for safety.  PT supervision for safety, verbal cues for upright posture with weight on BLEs. Feet hip width apart.  On floor eyes open head movements right/left, up/down & diagonals 8 reps ea. Intermittent touch for balance.  On floor eyes closed head movements right/left, up/down & diagonals 8 reps ea. LUE touch on chair back for balance & to facilitate weight on prosthesis.   On foam eyes open head movements right/left, up/down & diagonals 8 reps ea. LUE touch on chair back for balance & to facilitate weight on prosthesis.  -PT added as HEP with verbal, demo & HO cues. Pt verbalized & return demo understanding.    03/05/2022: Prosthetic Training with Transfemoral prosthesis: PT demo & verbal cues on safety with rollator walker use including sit to/from stand on seat & negotiating ramps / curbs. Pt verbalized & return demo prosthetic gait with rollator walker, sit / stand from rollator seat and neg ramp / curb. PT reviewed foot position for stationary activities to facilitate prosthetic knee staying extended.  Neuromuscular Re-education for balance Standing in corner with chair back anterior for safety.  PT supervision for safety, verbal cues for upright posture with weight on BLEs. Feet hip width apart.  On floor eyes open head movements right/left, up/down & diagonals 8 reps ea. Intermittent touch for balance.  On floor eyes closed head movements right/left, up/down & diagonals 8 reps ea. LUE touch on chair back for balance & to facilitate weight on prosthesis.   On foam eyes open head movements right/left, up/down & diagonals 8 reps ea. LUE touch on chair back for balance & to facilitate weight on prosthesis.    02/26/2022: Prosthetic Training with Transfemoral prosthesis: Pt amb 50' X 3 with SBQC & HHA / modA.  PT verbal cues ea time for proper sit to stand technique engaging prosthesis.    Neuromuscular Re-ed:  HEP working on balance. Stand in front of sink with one chair back on each side of you and 3rd chair behind you:  Your prosthetic toes should be back ~1" to your right toes.  Both feet facing forward about hip width apart.   Turn in circles using cane.  When going to left lead with left leg.  When going to right, lead with right leg.  Reach both hands over head and return both hands to counter.  Follow your hands with your eyes.  Keep weight on both legs.  Turn your upper body to place item with both hands on back of chair to each side. Stop at counter between directions.  Follow your hands with your eyes.  Keep weight on both legs. Lean to each side placing item in chair bottom with one hand (use right hand going to right & left hand going to left).  Follow your hand with your eyes.  Turn around you are facing chair that was behind you.  Make sure that your back side does not touch counter when you bend forward.  Lean forward to place an item in chair seat then stand back up straight.   Stand up & sit down using chair seat to push up. Goal is to not touch sink unless off balance.   standing Up sit at edge of chair with right foot near chair, left foot /prosthesis with heel touching. push forward to SMELL flower,  keep prosthetic foot in contact with floor.  then straight up back.  Sitting down: stand with right foot near chair & left prosthetic foot forward a little bow to SMELL to flower unlocking prosthetic knee reach with LEFT hand first to sit.    Sit at edge of chair with both feet touching & with back up straight. Move head 10 times with eyes open then 10 times eyes closed. Head motion is important.  Side to side.  Up tilting head back & down chin to chest. Diagonal up to right then down to left.  Diagonal up to left then down to right.   HOME EXERCISE PROGRAM: Access Code: [email or web address] URL: [email or web address] Date: 03/12/2022 Prepared by:  Jamey Reas  Exercises - sit to stand to sit with counter  - 2-3 x daily - 7 x weekly - 1 sets - 10 reps - 5 seconds hold - Modified Thomas Stretch  - 2-3 x daily - 7 x weekly - 1 sets - 2-3 reps - 20-30 seconds hold - wide stance head motions eyes open  - 1 x daily - 4-7 x weekly - 1 sets - 10 reps - 2 seconds hold - Feet Apart with Eyes Closed with Head Motions  - 1 x daily - 4-7 x weekly - 1 sets - 10 reps - 2 seconds hold - Wide stance on Foam Pad head movements  - 1 x daily - 4-7 x weekly - 1 sets - 10 reps - 2 seconds hold - Ankle Alphabet in Elevation  - 1 x daily - 7 x weekly - 3 sets - 1 reps - 10-15 minutes hold  Stand in front of sink with one chair back on each side of you and 3rd chair behind you:  Your prosthetic toes should be back ~1" to your right toes.  Both feet facing forward about hip width apart.   Turn in circles using cane.  When going to left lead with left leg.  When going to right, lead with right leg.  Reach both hands over head and return both hands to counter.  Follow your hands with your eyes.  Keep weight on both legs.  Turn your upper body to place item with both hands on back of chair to each side. Stop at counter between directions.  Follow your hands with your eyes.  Keep weight on both legs. Lean to each side placing item in chair bottom with one hand (use right hand going to right & left hand going to left).  Follow your hand with your eyes.  Turn around you are facing chair that was behind you.  Make sure that your back side does not touch counter when you bend forward.  Lean forward to place an item in chair seat then stand back up straight.   Stand up & sit down using chair seat to push up. Goal is to not touch sink unless off balance.   standing Up sit at edge of chair with right foot near chair, left foot /prosthesis with heel touching. push forward to SMELL flower,  keep prosthetic foot in contact with floor.  then straight up back.  Sitting  down: stand with right foot near chair & left prosthetic foot forward a little bow to SMELL to flower unlocking prosthetic knee reach with LEFT hand first to sit.    ASSESSMENT: CLINICAL IMPRESSION: Patient appears to understanding HEP for balance and elevation for edema RLE.  Pt appears to understand positioning for stationary ADLs.    OBJECTIVE IMPAIRMENTS: Abnormal gait, decreased activity tolerance, decreased balance, decreased endurance,  decreased knowledge of condition, decreased knowledge of use of DME, decreased mobility, difficulty walking, decreased ROM, decreased strength, impaired flexibility, postural dysfunction, prosthetic dependency , and pain.    ACTIVITY LIMITATIONS: carrying, lifting, bending, sitting, standing, sleeping, stairs, transfers, and locomotion level   PARTICIPATION LIMITATIONS: meal prep, cleaning, laundry, community activity, and yard work   PERSONAL FACTORS: Age, Fitness, Past/current experiences, Time since onset of injury/illness/exacerbation, and 3+ comorbidities: see PMH  are also affecting patient's functional outcome.    REHAB POTENTIAL: Good   CLINICAL DECISION MAKING: Evolving/moderate complexity   EVALUATION COMPLEXITY: Moderate     GOALS: Goals reviewed with patient? Yes   SHORT TERM GOALS: Target date: 03/12/2022   Patient verbalizes & demonstrates understanding of HEP. Baseline: SEE OBJECTIVE DATA Goal status: MET 03/12/2022 2.  Patient reports donning prosthesis within 90 min of arising in mornings.  Baseline: SEE OBJECTIVE DATA Goal status: MET 03/12/2022   3.  Patient sit to/from stand from chairs without armrests using UEs & stabilizes correctly. Baseline: SEE OBJECTIVE DATA Goal status: MET 03/12/2022   4. Patient ambulates 7' with [facility] & prosthesis with minA. Baseline: SEE OBJECTIVE DATA Goal status: ongoing 03/12/2022     LONG TERM GOALS: Target date: 05/08/2022   Patient demonstrates & verbalized understanding of  prosthetic care to enable safe utilization of prosthesis. Baseline: SEE OBJECTIVE DATA Goal status: INITIAL   Patient tolerates prosthesis wear >90% of awake hours without skin or limb pain issues. Baseline: SEE OBJECTIVE DATA Goal status: INITIAL   Berg Balance >/= 36/56 to indicate lower fall risk Baseline: SEE OBJECTIVE DATA Goal status: INITIAL   Patient ambulates >200' with prosthesis & SBQC modified independently Baseline: SEE OBJECTIVE DATA Goal status: INITIAL   Patient negotiates ramps, curbs & stairs with single rail with prosthesis & SBQC with family safely.  Baseline: SEE OBJECTIVE DATA Goal status: INITIAL   Patient verbalizes & demonstrates understanding of ongoing HEP Baseline: SEE OBJECTIVE DATA Goal status: INITIAL   7.  Patient reports low back pain increases </= 2 increments with above activities. Baseline: SEE OBJECTIVE DATA Goal status: INITIAL   PLAN:   PT FREQUENCY: 1x/week   PT DURATION:  90 days    PLANNED INTERVENTIONS: Therapeutic exercises, Therapeutic activity, Neuromuscular re-education, Balance training, Gait training, Patient/Family education, Self Care, Stair training, Vestibular training, Prosthetic training, DME instructions, Manual therapy, Re-evaluation, and Physical performance testng.   PLAN FOR NEXT SESSION:  progress prosthetic gait with [facility]   Jamey Reas, PT, DPT 03/12/2022, 10:24 AM

## 2022-03-19 ENCOUNTER — Ambulatory Visit (INDEPENDENT_AMBULATORY_CARE_PROVIDER_SITE_OTHER): Payer: Medicare Other | Admitting: Physical Therapy

## 2022-03-19 ENCOUNTER — Encounter: Payer: Self-pay | Admitting: Physical Therapy

## 2022-03-19 DIAGNOSIS — R2681 Unsteadiness on feet: Secondary | ICD-10-CM | POA: Diagnosis not present

## 2022-03-19 DIAGNOSIS — M25652 Stiffness of left hip, not elsewhere classified: Secondary | ICD-10-CM | POA: Diagnosis not present

## 2022-03-19 DIAGNOSIS — R2689 Other abnormalities of gait and mobility: Secondary | ICD-10-CM

## 2022-03-19 DIAGNOSIS — M6281 Muscle weakness (generalized): Secondary | ICD-10-CM

## 2022-03-19 DIAGNOSIS — R293 Abnormal posture: Secondary | ICD-10-CM

## 2022-03-19 NOTE — Therapy (Signed)
OUTPATIENT PHYSICAL THERAPY TREATMENT NOTE   Patient Name: [name] MRN: [id number] DOB:Jan 05, 1941, 82 y.o., female Today's Date: 03/19/2022  PCP: [name], MD  REFERRING PROVIDER: [name], MD   END OF SESSION:   PT End of Session - 03/19/22 0932     Visit Number 6    Number of Visits 14    Date for PT Re-Evaluation 05/08/22    Authorization Type [employer] Medicare    Authorization Time Period no co-pay, no visit limit    Progress Note Due on Visit 10    PT Start Time 0930    PT Stop Time 1013    PT Time Calculation (min) 43 min    Equipment Utilized During Treatment Gait belt    Activity Tolerance Patient tolerated treatment well    Behavior During Therapy WFL for tasks assessed/performed                 Past Medical History:  Diagnosis Date   Allergy    Anemia    childhood   Anxiety    Aortic stenosis    Arterial stenosis ([place])    mesenteric   Arthritis    Breast cancer ([place]) 12/05/10   R breast, inv mammary, in situ,ER/PR +,HER2 -   Cancer ([place])    Carcinoma of breast treated with adjuvant chemotherapy ([place])    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[place])    Claudication ([place])    Coronary artery disease    stent 2007   Depression    Diabetes mellitus    Difficulty sleeping    Diverticulosis 12/10/03   Elevated cholesterol    Generalized weakness    GERD (gastroesophageal reflux disease)    Heart murmur    Hepatitis    as an infant   HSV-1 (herpes simplex virus 1) infection    Hyperlipidemia    Hypertension    Hypothyroidism    Osteoarthritis    PAD (peripheral artery disease) ([place])    Personal history of chemotherapy    Personal history of radiation therapy    Pneumonia    2014 september and March 2022   Rheumatoid arthritis ([place])    S/P aortic valve replacement with bioprosthetic valve 05/04/2020   [facility] Inspiris Resilia stented bovine pericardial tissue valve, size 23 mm   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   LIMA to D3, SVG  to PDA, EVH via right thigh   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   Dr [name]   Spinal stenosis    Thyroid disease    Past Surgical History:  Procedure Laterality Date   ABDOMINAL AORTOGRAM W/LOWER EXTREMITY Bilateral 04/29/2019   Procedure: ABDOMINAL AORTOGRAM W/LOWER EXTREMITY;  Surgeon: Marty Heck, MD;  Location: [place] [facility];  Service: Cardiovascular;  Laterality: Bilateral;   ABDOMINAL AORTOGRAM W/LOWER EXTREMITY Left 07/01/2019   Procedure: ABDOMINAL AORTOGRAM W/ Left LOWER EXTREMITY [place];  Surgeon: Marty Heck, MD;  Location: [place] [facility];  Service: Cardiovascular;  Laterality: Left;   ABDOMINAL AORTOGRAM W/LOWER EXTREMITY Left 03/03/2021   Procedure: ABDOMINAL AORTOGRAM W/LOWER EXTREMITY;  Surgeon: Cherre Robins, MD;  Location: [place] [facility];  Service: Cardiovascular;  Laterality: Left;   ABDOMINAL AORTOGRAM W/LOWER EXTREMITY Right 06/12/2021   Procedure: ABDOMINAL AORTOGRAM W/LOWER EXTREMITY;  Surgeon: Waynetta Sandy, MD;  Location: [place] [facility];  Service: Cardiovascular;  Laterality: Right;  LOWER LEG   ABDOMINAL AORTOGRAM W/LOWER EXTREMITY N/A 03/07/2022   Procedure:  ABDOMINAL AORTOGRAM W/LOWER EXTREMITY;  Surgeon: [name], MD;  Location: [place] [facility];  Service: Cardiovascular;  Laterality: N/A;   AMPUTATION Left 03/06/2021   Procedure: LEFT ABOVE KNEE AMPUTATION;  Surgeon: [name], MD;  Location: [place];  Service: Vascular;  Laterality: Left;   ANTERIOR AND POSTERIOR REPAIR N/A 04/05/2016   Procedure: ANTERIOR (CYSTOCELE) AND POSTERIOR REPAIR (RECTOCELE);  Surgeon: [name], MD;  Location: [facility];  Service: Gynecology;  Laterality: N/A;   AORTIC VALVE REPLACEMENT N/A 05/04/2020   Procedure: AORTIC VALVE REPLACEMENT (AVR) USING INSPIRIS 23MM RESILIA AORTIC VALVE;  Surgeon: Rexene Alberts, MD;  Location: [place];  Service: Open Heart Surgery;  Laterality: N/A;   BREAST BIOPSY     BREAST LUMPECTOMY   1983   benign biopsy   BREAST LUMPECTOMY Right 12/29/2010   snbx, ER/PR +, Her2 -, 0/1 node pos.   CARPAL TUNNEL RELEASE Bilateral 9/12,8,12   CHOLECYSTECTOMY     COLONOSCOPY N/A 02/09/2013   Procedure: COLONOSCOPY;  Surgeon: [name], MD;  [facility];  Service: Endoscopy;  Laterality: N/A;   COLONOSCOPY     CORONARY ANGIOPLASTY     CORONARY ARTERY BYPASS GRAFT N/A 05/04/2020   Procedure: CORONARY ARTERY BYPASS GRAFTING (CABG), ON PUMP, TIMES TWO, USING LEFT INTERNAL MAMMARY ARTERY AND RIGHT ENDOSCOPICALLY HARVESTED GREATER SAPHENOUS VEIN;  Surgeon: Rexene Alberts, MD;  Location: [place];  Service: Open Heart Surgery;  Laterality: N/A;   DILATION AND CURETTAGE OF UTERUS     ESOPHAGOGASTRODUODENOSCOPY N/A 02/09/2013   Procedure: ESOPHAGOGASTRODUODENOSCOPY (EGD);  Surgeon: [name], MD;  Location: [name];  Service: Endoscopy;  Laterality: N/A;   FOOT SPURS     HYSTEROSCOPY WITH D & C N/A 09/11/2012   Procedure: DILATATION AND CURETTAGE ;  Surgeon: [name], MD;  Location: [facility];  Service: Gynecology;  Laterality: N/A;   KNEE SURGERY  2007   LAPAROSCOPIC ASSISTED VAGINAL HYSTERECTOMY N/A 04/05/2016   Procedure: LAPAROSCOPIC ASSISTED VAGINAL HYSTERECTOMY possible BSO;  Surgeon: [name], MD;  Location: [facility];  Service: Gynecology;  Laterality: N/A;   LUMBAR LAMINECTOMY/DECOMPRESSION MICRODISCECTOMY N/A 10/20/2014   Procedure: MICRO LUMBER DECOMPRESSION L3-4 L4-5;  Surgeon: [name], MD;  [facility];  Service: Orthopedics;  Laterality: N/A;   LUMBAR LAMINECTOMY/DECOMPRESSION MICRODISCECTOMY Bilateral 10/13/2015   Procedure: MICRO LUMBAR DECOMPRESSION L5 - S1 AND REDO DECOMPRESSION L4 - L5 AND REMOVAL OF FACET CYST L4 - L5 2 LEVELS;  Surgeon: [name], MD;  [facility];  Service: Orthopedics;  Laterality: Bilateral;   PERIPHERAL VASCULAR ATHERECTOMY Right 06/12/2021   Procedure: PERIPHERAL VASCULAR ATHERECTOMY;  Surgeon: Waynetta Sandy, MD;  Location: [facility];  Service: Cardiovascular;  Laterality: Right;  SFA/POP/AT   PERIPHERAL VASCULAR INTERVENTION Right 04/29/2019   Procedure: PERIPHERAL VASCULAR INTERVENTION;  Surgeon: Marty Heck, MD;  Location: [facility];  Service: Cardiovascular;  Laterality: Right;   PERIPHERAL VASCULAR INTERVENTION Right 06/12/2021   Procedure: PERIPHERAL VASCULAR INTERVENTION;  Surgeon: Waynetta Sandy, MD;  Location: [facility] [facility];  Service: Cardiovascular;  Laterality: Right;  SFP/POPLITEAL/AT   PERIPHERAL VASCULAR INTERVENTION  03/07/2022   Procedure: PERIPHERAL VASCULAR INTERVENTION;  Surgeon: [name], MD;  Location: [place] [facility] [facility];  Service: Cardiovascular;;   PORT-A-CATH REMOVAL  04/17/2011   Procedure: REMOVAL PORT-A-CATH;  Surgeon: [name], MD;  Location: [place];  Service: General;  Laterality: Left;   PORTACATH PLACEMENT  02/07/2011  Procedure: INSERTION PORT-A-CATH;  Surgeon: [name], MD;  [facility];  Service: General;  Laterality: N/A;   RIGHT/LEFT HEART CATH AND CORONARY ANGIOGRAPHY N/A 03/17/2020   Procedure: RIGHT/LEFT HEART CATH AND CORONARY ANGIOGRAPHY;  Surgeon: Burnell Blanks, MD;  Location: [place] [facility];  Service: Cardiovascular;  Laterality: N/A;   TEE WITHOUT CARDIOVERSION N/A 05/04/2020   Procedure: TRANSESOPHAGEAL ECHOCARDIOGRAM (TEE);  Surgeon: Rexene Alberts, MD;  Location: [place];  Service: Open Heart Surgery;  Laterality: N/A;   UPPER GASTROINTESTINAL ENDOSCOPY     Patient Active Problem List   Diagnosis Date Noted   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[place]) 03/13/2021   Sinus tachycardia 03/11/2021   Elevated troponin 03/08/2021   Stroke-like symptoms 03/08/2021   Anemia of chronic disease 03/08/2021   Acute supraglottitis with epiglottitis 02/05/2021   Epiglottitis 02/05/2021   Reactive airway disease 01/20/2021   Diabetic peripheral  neuropathy ([place]) 12/19/2020   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   Primary insomnia 11/02/2020   Irritation of eyelid 07/26/2020   S/P aortic valve replacement with bioprosthetic valve 05/04/2020   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   Peripheral artery disease ([facility])    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[place]) 04/07/2019   Low back pain 02/03/2019   Scoliosis deformity of spine 02/03/2019   Osteoarthritis of hip 11/17/2018   Claudication ([place])    Chronic headaches 11/12/2017   Degeneration of lumbar intervertebral disc 06/13/2017   Hyperkalemia 06/03/2017   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[place])    Mass of left adrenal gland ([place]) 04/19/2017   Spinal stenosis of lumbar region 10/20/2014   Hyperlipidemia 03/16/2013   Dense breasts 12/22/2012   Anxiety state 04/16/2012   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/16/2012   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   Osteoarthritis of knee 04/16/2012   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 female breast ([place]) 12/20/2010   History of breast cancer 12/05/2010   Depression 09/14/2008   GERD 09/14/2008   Type 2 diabetes mellitus with neurologic complication, with long-term current use of insulin ([place]) 05/20/2008   Coronary atherosclerosis 04/17/2007   Hypothyroidism 04/03/2007    REFERRING DIAG: [id number] (ICD-10-CM) - Above knee amputation of left lower extremity   ONSET DATE: 02/02/2022 MD referral to PT   THERAPY DIAG:  Other abnormalities of gait and mobility  Unsteadiness on feet  Muscle weakness (generalized)  Stiffness of left hip, not elsewhere classified  Abnormal posture  Rationale for Evaluation and Treatment Rehabilitation  PERTINENT HISTORY: Left TFA, DM2, neuropathy, PVD, CKD st4, RLE stent, aortic stenosis, CAD, CABG X 2, HTN, OA, lumbar spinal stenosis, breast CA   PRECAUTIONS: Fall  SUBJECTIVE:  SUBJECTIVE STATEMENT:  Her right leg seems to have healed but continues to swell.  She has been doing her exercises.  Except not doing hip flexor stretch due to sg site.   PAIN:  Are you having pain? Yes: NPRS scale: today 0/10 and in last week 0/10, when doing stretch of hip flexors 6/10 Pain location: right lumbar  Pain description: achy Aggravating factors: walking without shoes with rollator Relieving factors: sit down   OBJECTIVE: (objective measures completed at initial evaluation unless otherwise dated)   COGNITION: Overall cognitive status: Within functional limits for tasks assessed   MUSCLE LENGTH: Hamstrings:  Thomas test: Left -37 deg   POSTURE: rounded shoulders, forward head, increased lumbar lordosis, increased thoracic kyphosis, flexed trunk , and weight shift right   LOWER EXTREMITY ROM:   ROM P:passive  A:active Right eval Left eval  Hip flexion      Hip extension Standing -27* excessive lordosis    Hip abduction      Hip adduction      Hip internal rotation      Hip external rotation      Knee flexion      Knee extension      Ankle dorsiflexion      Ankle plantarflexion      Ankle inversion      Ankle eversion       (Blank rows = not tested)   LOWER EXTREMITY MMT:   MMT Right eval Left eval  Hip flexion      Hip extension      Hip abduction      Hip adduction      Hip internal rotation      Hip external rotation      Knee flexion      Knee extension      Ankle dorsiflexion      Ankle plantarflexion      Ankle inversion      Ankle eversion      (Blank rows = not tested)   TRANSFERS: Sit to stand: SBA requires UE assist on armrest, prosthetic knee locked delayed in process impairing stabilization upon arising. Uses back of legs against chair & external support to stabilize Stand to sit: SBA uses back of legs against chair for  stability. External support required & armrests on chair.    GAIT: Gait pattern: step to pattern, decreased arm swing- Left, decreased step length- Right, decreased stance time- Left, circumduction- Left, Left hip hike, antalgic, lateral hip instability, trunk flexed, and abducted- Left Distance walked: 100' Assistive device utilized: Lobbyist with HHA (required) and Walker - 4 wheeled Level of assistance: Mod A with SBQC / HHA and SBA / verbal cues for rollator Gait velocity: 1.22 ft/sec with rollator walker   FUNCTIONAL TESTs:  Eval (02/07/2022): Timed up and go (TUG): 28.97 sec with rollator Berg Balance Scale: 21/56   [facility] PT Assessment - 02/07/22 1100                Standardized Balance Assessment    Standardized Balance Assessment Berg Balance Test;Timed Up and Go Test          Berg Balance Test    Sit to Stand Able to stand  independently using hands     Standing Unsupported Able to stand safely 2 minutes     Sitting with Back Unsupported but Feet Supported on Floor or Stool Able to sit safely and securely 2 minutes     Stand to Sit  Controls descent by using hands     Transfers Able to transfer safely, definite need of hands     Standing Unsupported with Eyes Closed Able to stand 3 seconds     Standing Unsupported with Feet Together Needs help to attain position but able to stand for 30 seconds with feet together     From Standing, Reach Forward with Outstretched Arm Reaches forward but needs supervision     From Standing Position, Pick up Object from Floor Unable to try/needs assist to keep balance     From Standing Position, Turn to Look Behind Over each Shoulder Needs assist to keep from losing balance and falling     Turn 360 Degrees Needs assistance while turning     Standing Unsupported, Alternately Place Feet on Step/Stool Needs assistance to keep from falling or unable to try     Standing Unsupported, One Foot in [employer] balance while stepping or  standing     Standing on One Leg Unable to try or needs assist to prevent fall     Total Score 21     Berg comment: < 36 high risk for falls (close to 100%) 46-51 moderate (>50%)   37-45 significant (>80%) 52-55 lower (> 25%)          Timed Up and Go Test    Normal TUG (seconds) 28.97   rollator walker & prosthesis    TUG Comments TUG:  Normal: >13.5 sec indicates high fall risk  Manual: > 14.5 sec indicates high fall risk  Cognitive: >15 sec indicates high fall risk                   CURRENT PROSTHETIC WEAR ASSESSMENT:  Patient is independent with: skin check, residual limb care, and prosthetic cleaning Patient is dependent with: correct ply sock adjustment, proper wear schedule/adjustment, and proper weight-bearing schedule/adjustment Donning prosthesis: Modified independence per pt report Doffing prosthesis: Modified independence per pt report Prosthetic wear tolerance: ~8 hours/day, 7 days/week (~60-70% of awake hours) Prosthetic weight bearing tolerance: 8 minutes with no c/o limb pain Residual limb condition: pt reports no issues Prosthetic description: silicon liner with suction ring suspension, ischial containment socket with flexible inner socket, non-hydraulic knee,        TODAY'S TREATMENT:                                                                                                                             DATE:  03/19/2022: Prosthetic Training with Transfemoral prosthesis: Pt sit to / from stand pushing off chair seat without armrests use goal to not touch table for support (achieved ~1/3 time) 5 reps 2 sets. Pt amb 88' with SBQC 1st rep HHA, 2nd rep minA without HHA.  Inside //bars: working on initiating gait with RLE as stepping with prosthesis first causes balance issue and ASIS (headlights) facing forward and sequencing moving SBQC prior to prosthesis. Demo, verbal & tactile cues.  Progressed from  BUEs rails, then [facility] / RUE rail, then [facility] intermittent touch  rail. Amb 100' out of clinic with PT cues first half on sequencing & 2nd half on pelvic orientation.  minA except 5 LOBs requiring modA & LUE touch.  Backwards walking with PT demo, mirror & verbal cues on technique.  Inside // bars Progressed from [place] rails, then [facility] / RUE rail, then [facility] intermittent touch rail. Side stepping walking with PT demo, mirror & verbal cues on technique.  Inside // bars Progressed from [place] rails, then [facility] / RUE rail, then [facility] intermittent touch rail. Stationary ADLs with prosthetic foot set back 1" to right toes and SBQC LUE to increase weight on prosthesis. This also frees her dominant RUE.  Pt verbalized & return demo understanding.    03/12/2022: Prosthetic Training with Transfemoral prosthesis: PT instructed in standing still (stationary) activities to have prosthetic toes back 1-2" to right toes, ASIS (headlights) facing forward and touch LUE as needed to facilitate weight on prosthesis.  Pt verbalized understanding.    Therapeutic Activities: PT instructed in Bag Balm vs Vasoline that she is currently using. Pt verbalized understanding. PT demo & verbal cues on proper elevation and doing alphabet with ankle for muscle activity 3 times during 10-15 elevation. Pt verbalized understanding.    Neuromuscular Re-education for balance Standing in corner with chair back anterior for safety.  PT supervision for safety, verbal cues for upright posture with weight on BLEs. Feet hip width apart.  On floor eyes open head movements right/left, up/down & diagonals 8 reps ea. Intermittent touch for balance.  On floor eyes closed head movements right/left, up/down & diagonals 8 reps ea. LUE touch on chair back for balance & to facilitate weight on prosthesis.   On foam eyes open head movements right/left, up/down & diagonals 8 reps ea. LUE touch on chair back for balance & to facilitate weight on prosthesis.  -PT added as HEP with verbal, demo & HO cues. Pt verbalized &  return demo understanding.    03/05/2022: Prosthetic Training with Transfemoral prosthesis: PT demo & verbal cues on safety with rollator walker use including sit to/from stand on seat & negotiating ramps / curbs. Pt verbalized & return demo prosthetic gait with rollator walker, sit / stand from rollator seat and neg ramp / curb. PT reviewed foot position for stationary activities to facilitate prosthetic knee staying extended.  Neuromuscular Re-education for balance Standing in corner with chair back anterior for safety.  PT supervision for safety, verbal cues for upright posture with weight on BLEs. Feet hip width apart.  On floor eyes open head movements right/left, up/down & diagonals 8 reps ea. Intermittent touch for balance.  On floor eyes closed head movements right/left, up/down & diagonals 8 reps ea. LUE touch on chair back for balance & to facilitate weight on prosthesis.   On foam eyes open head movements right/left, up/down & diagonals 8 reps ea. LUE touch on chair back for balance & to facilitate weight on prosthesis.    HOME EXERCISE PROGRAM: Access Code: [email or web address] URL: [email or web address] Date: 03/12/2022 Prepared by: Jamey Reas  Exercises - sit to stand to sit with counter  - 2-3 x daily - 7 x weekly - 1 sets - 10 reps - 5 seconds hold - Modified Thomas Stretch  - 2-3 x daily - 7 x weekly - 1 sets - 2-3 reps - 20-30 seconds hold - wide stance head motions eyes open  - 1 x daily - 4-7 x weekly -  1 sets - 10 reps - 2 seconds hold - Feet Apart with Eyes Closed with Head Motions  - 1 x daily - 4-7 x weekly - 1 sets - 10 reps - 2 seconds hold - Wide stance on Foam Pad head movements  - 1 x daily - 4-7 x weekly - 1 sets - 10 reps - 2 seconds hold - Ankle Alphabet in Elevation  - 1 x daily - 7 x weekly - 3 sets - 1 reps - 10-15 minutes hold  Stand in front of sink with one chair back on each side of you and 3rd chair behind you:  Your prosthetic toes should be  back ~1" to your right toes.  Both feet facing forward about hip width apart.   Turn in circles using cane.  When going to left lead with left leg.  When going to right, lead with right leg.  Reach both hands over head and return both hands to counter.  Follow your hands with your eyes.  Keep weight on both legs.  Turn your upper body to place item with both hands on back of chair to each side. Stop at counter between directions.  Follow your hands with your eyes.  Keep weight on both legs. Lean to each side placing item in chair bottom with one hand (use right hand going to right & left hand going to left).  Follow your hand with your eyes.  Turn around you are facing chair that was behind you.  Make sure that your back side does not touch counter when you bend forward.  Lean forward to place an item in chair seat then stand back up straight.   Stand up & sit down using chair seat to push up. Goal is to not touch sink unless off balance.   standing Up sit at edge of chair with right foot near chair, left foot /prosthesis with heel touching. push forward to SMELL flower,  keep prosthetic foot in contact with floor.  then straight up back.  Sitting down: stand with right foot near chair & left prosthetic foot forward a little bow to SMELL to flower unlocking prosthetic knee reach with LEFT hand first to sit.    ASSESSMENT: CLINICAL IMPRESSION: PT session focused on prosthetic gait.  When she initiates with prosthesis, it throws off her balance. So PT instructed to initiate with RLE which helped.  PT also instructed in pelvic orientation which improved balance with gait.  Pt continues to benefit from skilled PT.    OBJECTIVE IMPAIRMENTS: Abnormal gait, decreased activity tolerance, decreased balance, decreased endurance, decreased knowledge of condition, decreased knowledge of use of DME, decreased mobility, difficulty walking, decreased ROM, decreased strength, impaired flexibility, postural  dysfunction, prosthetic dependency , and pain.    ACTIVITY LIMITATIONS: carrying, lifting, bending, sitting, standing, sleeping, stairs, transfers, and locomotion level   PARTICIPATION LIMITATIONS: meal prep, cleaning, laundry, community activity, and yard work   PERSONAL FACTORS: Age, Fitness, Past/current experiences, Time since onset of injury/illness/exacerbation, and 3+ comorbidities: see PMH  are also affecting patient's functional outcome.    REHAB POTENTIAL: Good   CLINICAL DECISION MAKING: Evolving/moderate complexity   EVALUATION COMPLEXITY: Moderate     GOALS: Goals reviewed with patient? Yes   SHORT TERM GOALS: Target date: 04/09/2022   Patient verbalizes & demonstrates understanding of updated HEP. Baseline: SEE OBJECTIVE DATA Goal status: SET 03/19/2022 2.  Patient able to balance 2 min without UE support with supervision. Baseline: SEE OBJECTIVE DATA Goal status:  SET 03/19/2022   3.  Patient ambulates 53' with [facility] & prosthesis with minA. Baseline: SEE OBJECTIVE DATA Goal status: SET 03/19/2022     LONG TERM GOALS: Target date: 05/08/2022   Patient demonstrates & verbalized understanding of prosthetic care to enable safe utilization of prosthesis. Baseline: SEE OBJECTIVE DATA Goal status: INITIAL   Patient tolerates prosthesis wear >90% of awake hours without skin or limb pain issues. Baseline: SEE OBJECTIVE DATA Goal status: INITIAL   Berg Balance >/= 36/56 to indicate lower fall risk Baseline: SEE OBJECTIVE DATA Goal status: INITIAL   Patient ambulates >200' with prosthesis & SBQC modified independently Baseline: SEE OBJECTIVE DATA Goal status: INITIAL   Patient negotiates ramps, curbs & stairs with single rail with prosthesis & SBQC with family safely.  Baseline: SEE OBJECTIVE DATA Goal status: INITIAL   Patient verbalizes & demonstrates understanding of ongoing HEP Baseline: SEE OBJECTIVE DATA Goal status: INITIAL   7.  Patient reports low back  pain increases </= 2 increments with above activities. Baseline: SEE OBJECTIVE DATA Goal status: INITIAL   PLAN:   PT FREQUENCY: 1x/week   PT DURATION:  90 days    PLANNED INTERVENTIONS: Therapeutic exercises, Therapeutic activity, Neuromuscular re-education, Balance training, Gait training, Patient/Family education, Self Care, Stair training, Vestibular training, Prosthetic training, DME instructions, Manual therapy, Re-evaluation, and Physical performance testng.   PLAN FOR NEXT SESSION:  continue to  progress prosthetic gait with [facility]   Jamey Reas, PT, DPT 03/19/2022, 10:55 AM

## 2022-03-22 ENCOUNTER — Telehealth: Payer: Self-pay | Admitting: Cardiovascular Disease

## 2022-03-22 NOTE — Telephone Encounter (Signed)
Pt c/o medication issue:  1. Name of Medication: icosapent Ethyl (VASCEPA) 1 g capsule   2. How are you currently taking this medication (dosage and times per day)? TAKE 2 CAPSULES(2 GRAMS) BY MOUTH TWICE DAILYPatient taking differently: Take 1 g by mouth 2 (two) times daily.   3. Are you having a reaction (difficulty breathing--STAT)? no  4. What is your medication issue? Insurance will not cover this medication for this year. Patient is calling to see what her options are. Please advise

## 2022-03-23 MED ORDER — VASCEPA 1 G PO CAPS: 2.0000 g | ORAL_CAPSULE | Freq: Two times a day (BID) | ORAL | 3 refills | Status: DC

## 2022-03-23 NOTE — Telephone Encounter (Signed)
**Note De-Identified Gittel Mccamish Obfuscation** The pt is aware that her plan prefers name brand Vascepa over generic Icosapent and that I have called her pharmacy, [facility] and was advised that Dalene Seltzer is covered.  I advised her that I did request that they fill her Vascepa RX and to contact her when it is ready for pick up.  She thanked me for calling her with this update.

## 2022-03-26 ENCOUNTER — Ambulatory Visit (INDEPENDENT_AMBULATORY_CARE_PROVIDER_SITE_OTHER): Payer: Medicare Other | Admitting: Physical Therapy

## 2022-03-26 ENCOUNTER — Other Ambulatory Visit: Payer: Self-pay | Admitting: *Deleted

## 2022-03-26 ENCOUNTER — Encounter: Payer: Self-pay | Admitting: Physical Therapy

## 2022-03-26 DIAGNOSIS — R2689 Other abnormalities of gait and mobility: Secondary | ICD-10-CM | POA: Diagnosis not present

## 2022-03-26 DIAGNOSIS — M6281 Muscle weakness (generalized): Secondary | ICD-10-CM

## 2022-03-26 DIAGNOSIS — R2681 Unsteadiness on feet: Secondary | ICD-10-CM

## 2022-03-26 DIAGNOSIS — R293 Abnormal posture: Secondary | ICD-10-CM

## 2022-03-26 DIAGNOSIS — I739 Peripheral vascular disease, unspecified: Secondary | ICD-10-CM

## 2022-03-26 DIAGNOSIS — M25652 Stiffness of left hip, not elsewhere classified: Secondary | ICD-10-CM | POA: Diagnosis not present

## 2022-03-26 NOTE — Therapy (Signed)
OUTPATIENT PHYSICAL THERAPY TREATMENT NOTE   Patient Name: [name] MRN: [id number] DOB:May 24, 1940, 82 y.o., female Today's Date: 03/26/2022  PCP: [name], MD  REFERRING PROVIDER: [name], MD   END OF SESSION:   PT End of Session - 03/26/22 0935     Visit Number 7    Number of Visits 14    Date for PT Re-Evaluation 05/08/22    Authorization Type [employer] Medicare    Authorization Time Period no co-pay, no visit limit    Progress Note Due on Visit 10    PT Start Time 0930    PT Stop Time 1014    PT Time Calculation (min) 44 min    Equipment Utilized During Treatment Gait belt    Activity Tolerance Patient tolerated treatment well    Behavior During Therapy WFL for tasks assessed/performed                  Past Medical History:  Diagnosis Date   Allergy    Anemia    childhood   Anxiety    Aortic stenosis    Arterial stenosis ([place])    mesenteric   Arthritis    Breast cancer ([place]) 12/05/10   R breast, inv mammary, in situ,ER/PR +,HER2 -   Cancer ([place])    Carcinoma of breast treated with adjuvant chemotherapy ([place])    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[place])    Claudication ([place])    Coronary artery disease    stent 2007   Depression    Diabetes mellitus    Difficulty sleeping    Diverticulosis 12/10/03   Elevated cholesterol    Generalized weakness    GERD (gastroesophageal reflux disease)    Heart murmur    Hepatitis    as an infant   HSV-1 (herpes simplex virus 1) infection    Hyperlipidemia    Hypertension    Hypothyroidism    Osteoarthritis    PAD (peripheral artery disease) ([place])    Personal history of chemotherapy    Personal history of radiation therapy    Pneumonia    2014 september and March 2022   Rheumatoid arthritis ([place])    S/P aortic valve replacement with bioprosthetic valve 05/04/2020   Edwards Inspiris Resilia stented bovine pericardial tissue valve, size 23 mm   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   LIMA to D3, SVG  to PDA, EVH via right thigh   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   Dr [name]   Spinal stenosis    Thyroid disease    Past Surgical History:  Procedure Laterality Date   ABDOMINAL AORTOGRAM W/LOWER EXTREMITY Bilateral 04/29/2019   Procedure: ABDOMINAL AORTOGRAM W/LOWER EXTREMITY;  Surgeon: Marty Heck, MD;  Location: [facility];  Service: Cardiovascular;  Laterality: Bilateral;   ABDOMINAL AORTOGRAM W/LOWER EXTREMITY Left 07/01/2019   Procedure: ABDOMINAL AORTOGRAM W/ Left LOWER EXTREMITY [place];  Surgeon: Marty Heck, MD;  Location: [facility];  Service: Cardiovascular;  Laterality: Left;   ABDOMINAL AORTOGRAM W/LOWER EXTREMITY Left 03/03/2021   Procedure: ABDOMINAL AORTOGRAM W/LOWER EXTREMITY;  Surgeon: Cherre Robins, MD;  Location: [facility];  Service: Cardiovascular;  Laterality: Left;   ABDOMINAL AORTOGRAM W/LOWER EXTREMITY Right 06/12/2021   Procedure: ABDOMINAL AORTOGRAM W/LOWER EXTREMITY;  Surgeon: Waynetta Sandy, MD;  Location: [facility];  Service: Cardiovascular;  Laterality: Right;  LOWER LEG   ABDOMINAL AORTOGRAM W/LOWER EXTREMITY N/A 03/07/2022  Procedure: ABDOMINAL AORTOGRAM W/LOWER EXTREMITY;  Surgeon: [name], MD;  Location: [facility];  Service: Cardiovascular;  Laterality: N/A;   AMPUTATION Left 03/06/2021   Procedure: LEFT ABOVE KNEE AMPUTATION;  Surgeon: [name], MD;  Location: [facility];  Service: Vascular;  Laterality: Left;   ANTERIOR AND POSTERIOR REPAIR N/A 04/05/2016   Procedure: ANTERIOR (CYSTOCELE) AND POSTERIOR REPAIR (RECTOCELE);  Surgeon: [name], MD;  Location: [facility];  Service: Gynecology;  Laterality: N/A;   AORTIC VALVE REPLACEMENT N/A 05/04/2020   Procedure: AORTIC VALVE REPLACEMENT (AVR) USING INSPIRIS 23MM RESILIA AORTIC VALVE;  Surgeon: Rexene Alberts, MD;  Location: [facility];  Service: Open Heart Surgery;  Laterality: N/A;   BREAST BIOPSY     BREAST LUMPECTOMY   1983   benign biopsy   BREAST LUMPECTOMY Right 12/29/2010   snbx, ER/PR +, Her2 -, 0/1 node pos.   CARPAL TUNNEL RELEASE Bilateral 9/12,8,12   CHOLECYSTECTOMY     COLONOSCOPY N/A 02/09/2013   Procedure: COLONOSCOPY;  Surgeon: [name], MD;  [facility];  Service: Endoscopy;  Laterality: N/A;   COLONOSCOPY     CORONARY ANGIOPLASTY     CORONARY ARTERY BYPASS GRAFT N/A 05/04/2020   Procedure: CORONARY ARTERY BYPASS GRAFTING (CABG), ON PUMP, TIMES TWO, USING LEFT INTERNAL MAMMARY ARTERY AND RIGHT ENDOSCOPICALLY HARVESTED GREATER SAPHENOUS VEIN;  Surgeon: Rexene Alberts, MD;  Location: [facility];  Service: Open Heart Surgery;  Laterality: N/A;   DILATION AND CURETTAGE OF UTERUS     ESOPHAGOGASTRODUODENOSCOPY N/A 02/09/2013   Procedure: ESOPHAGOGASTRODUODENOSCOPY (EGD);  Surgeon: [name], MD;  Location[name];  Service: Endoscopy;  Laterality: N/A;   FOOT SPURS     HYSTEROSCOPY WITH D & C N/A 09/11/2012   Procedure: DILATATION AND CURETTAGE ;  Surgeon: [name], MD;  Location: [facility];  Service: Gynecology;  Laterality: N/A;   KNEE SURGERY  2007   LAPAROSCOPIC ASSISTED VAGINAL HYSTERECTOMY N/A 04/05/2016   Procedure: LAPAROSCOPIC ASSISTED VAGINAL HYSTERECTOMY possible BSO;  Surgeon: [name], MD;  Location: [facility];  Service: Gynecology;  Laterality: N/A;   LUMBAR LAMINECTOMY/DECOMPRESSION MICRODISCECTOMY N/A 10/20/2014   Procedure: MICRO LUMBER DECOMPRESSION L3-4 L4-5;  Surgeon: [name], MD;  [facility];  Service: Orthopedics;  Laterality: N/A;   LUMBAR LAMINECTOMY/DECOMPRESSION MICRODISCECTOMY Bilateral 10/13/2015   Procedure: MICRO LUMBAR DECOMPRESSION L5 - S1 AND REDO DECOMPRESSION L4 - L5 AND REMOVAL OF FACET CYST L4 - L5 2 LEVELS;  Surgeon: [name], MD;  [facility];  Service: Orthopedics;  Laterality: Bilateral;   PERIPHERAL VASCULAR ATHERECTOMY Right 06/12/2021   Procedure: PERIPHERAL VASCULAR ATHERECTOMY;  Surgeon: Waynetta Sandy, MD;  Location: [facility];  Service: Cardiovascular;  Laterality: Right;  SFA/POP/AT   PERIPHERAL VASCULAR INTERVENTION Right 04/29/2019   Procedure: PERIPHERAL VASCULAR INTERVENTION;  Surgeon: Marty Heck, MD;  Location: [facility];  Service: Cardiovascular;  Laterality: Right;   PERIPHERAL VASCULAR INTERVENTION Right 06/12/2021   Procedure: PERIPHERAL VASCULAR INTERVENTION;  Surgeon: Waynetta Sandy, MD;  Location: [facility];  Service: Cardiovascular;  Laterality: Right;  SFP/POPLITEAL/AT   PERIPHERAL VASCULAR INTERVENTION  03/07/2022   Procedure: PERIPHERAL VASCULAR INTERVENTION;  Surgeon: [name], MD;  Location: [facility];  Service: Cardiovascular;;   PORT-A-CATH REMOVAL  04/17/2011   Procedure: REMOVAL PORT-A-CATH;  Surgeon: [name], MD;  Location: [facility];  Service: General;  Laterality: Left;   PORTACATH PLACEMENT  02/07/2011  Procedure: INSERTION PORT-A-CATH;  Surgeon: [name], MD;  [facility];  Service: General;  Laterality: N/A;   RIGHT/LEFT HEART CATH AND CORONARY ANGIOGRAPHY N/A 03/17/2020   Procedure: RIGHT/LEFT HEART CATH AND CORONARY ANGIOGRAPHY;  Surgeon: Burnell Blanks, MD;  Location: [facility];  Service: Cardiovascular;  Laterality: N/A;   TEE WITHOUT CARDIOVERSION N/A 05/04/2020   Procedure: TRANSESOPHAGEAL ECHOCARDIOGRAM (TEE);  Surgeon: Rexene Alberts, MD;  Location: [facility];  Service: Open Heart Surgery;  Laterality: N/A;   UPPER GASTROINTESTINAL ENDOSCOPY     Patient Active Problem List   Diagnosis Date Noted   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[place]) 03/13/2021   Sinus tachycardia 03/11/2021   Elevated troponin 03/08/2021   Stroke-like symptoms 03/08/2021   Anemia of chronic disease 03/08/2021   Acute supraglottitis with epiglottitis 02/05/2021   Epiglottitis 02/05/2021   Reactive airway disease 01/20/2021   Diabetic peripheral  neuropathy ([place]) 12/19/2020   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   Primary insomnia 11/02/2020   Irritation of eyelid 07/26/2020   S/P aortic valve replacement with bioprosthetic valve 05/04/2020   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   Peripheral artery disease ([facility])    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[place]) 04/07/2019   Low back pain 02/03/2019   Scoliosis deformity of spine 02/03/2019   Osteoarthritis of hip 11/17/2018   Claudication ([place])    Chronic headaches 11/12/2017   Degeneration of lumbar intervertebral disc 06/13/2017   Hyperkalemia 06/03/2017   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[place])    Mass of left adrenal gland ([place]) 04/19/2017   Spinal stenosis of lumbar region 10/20/2014   Hyperlipidemia 03/16/2013   Dense breasts 12/22/2012   Anxiety state 04/16/2012   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/16/2012   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   Osteoarthritis of knee 04/16/2012   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 female breast ([place]) 12/20/2010   History of breast cancer 12/05/2010   Depression 09/14/2008   GERD 09/14/2008   Type 2 diabetes mellitus with neurologic complication, with long-term current use of insulin ([place]) 05/20/2008   Coronary atherosclerosis 04/17/2007   Hypothyroidism 04/03/2007    REFERRING DIAG: [id number] (ICD-10-CM) - Above knee amputation of left lower extremity   ONSET DATE: 02/02/2022 MD referral to PT   THERAPY DIAG:  Other abnormalities of gait and mobility  Unsteadiness on feet  Muscle weakness (generalized)  Stiffness of left hip, not elsewhere classified  Abnormal posture  Rationale for Evaluation and Treatment Rehabilitation  PERTINENT HISTORY: Left TFA, DM2, neuropathy, PVD, CKD st4, RLE stent, aortic stenosis, CAD, CABG X 2, HTN, OA, lumbar spinal stenosis, breast CA   PRECAUTIONS: Fall  SUBJECTIVE:  SUBJECTIVE STATEMENT:   She fell 4 days ago. She got out of bed & w/c was too far away.  Denies injuries. She is not doing hip flexor stretch due to vascular surgery site.    PAIN:  Are you having pain? Yes: NPRS scale: today  0/10 and in last week 0/10, when doing stretch of hip flexors 6/10 Pain location: right lumbar  Pain description: achy Aggravating factors: walking without shoes with rollator Relieving factors: sit down   OBJECTIVE: (objective measures completed at initial evaluation unless otherwise dated) COGNITION: 02/07/2022 / Eval: Overall cognitive status: Within functional limits for tasks assessed   MUSCLE LENGTH: 02/07/2022 / Elizabeth SauerMarcello Moores test: Left -37 deg   POSTURE: 02/07/2022 / Eval:   rounded shoulders, forward head, increased lumbar lordosis, increased thoracic kyphosis, flexed trunk , and weight shift right   LOWER EXTREMITY ROM:   ROM P:passive  A:active Right Eval 02/07/22  Hip flexion    Hip extension Standing -27* excessive lordosis  Hip abduction    Hip adduction    Hip internal rotation    Hip external rotation    Knee flexion    Knee extension    Ankle dorsiflexion    Ankle plantarflexion    Ankle inversion    Ankle eversion     (Blank rows = not tested)   LOWER EXTREMITY MMT:   MMT Right eval Left eval  Hip flexion      Hip extension      Hip abduction      Hip adduction      Hip internal rotation      Hip external rotation      Knee flexion      Knee extension      Ankle dorsiflexion      Ankle plantarflexion      Ankle inversion      Ankle eversion      (Blank rows = not tested)   TRANSFERS: 02/07/2022 / Eval:  Sit to stand: SBA requires UE assist on armrest, prosthetic knee locked delayed in process impairing stabilization upon arising. Uses back of legs against chair & external support to stabilize 02/07/2022 / Eval:   Stand to sit: SBA uses back of legs against chair for stability. External support required & armrests on chair.    GAIT: 02/07/2022 / Eval: Gait pattern: step to pattern, decreased arm swing- Left, decreased step length- Right, decreased stance time- Left, circumduction- Left, Left hip hike, antalgic, lateral hip instability, trunk flexed, and abducted- Left Distance walked: 100' Assistive device utilized: Lobbyist with HHA (required) and Walker - 4 wheeled Level of assistance: Mod A with SBQC / HHA and SBA / verbal cues for rollator Gait velocity: 1.22 ft/sec with rollator walker   FUNCTIONAL TESTs:  Eval (02/07/2022): Timed up and go (TUG): 28.97 sec with rollator Berg Balance Scale: 21/56  CURRENT PROSTHETIC WEAR ASSESSMENT:  02/07/2022 / Eval: Patient is independent with: skin check, residual limb care, and prosthetic cleaning Patient is dependent with: correct ply sock adjustment, proper wear schedule/adjustment, and proper weight-bearing schedule/adjustment Donning prosthesis: Modified independence per pt report Doffing prosthesis: Modified independence per pt report Prosthetic wear tolerance: ~8 hours/day, 7 days/week (~60-70% of awake hours) Prosthetic weight bearing tolerance: 8 minutes with no c/o limb pain Residual limb condition: pt reports no issues Prosthetic description: silicon liner with suction ring suspension, ischial containment socket with flexible inner socket, non-hydraulic knee,        TODAY'S TREATMENT:  DATE:  03/26/2022: Prosthetic Training with Transfemoral prosthesis: Sit to / from stand using UEs on seat of chair & no touch to stabilize.  5 reps 2 sets. Pt amb with counter support LUE & SBQC RUE forward hovering over counter & backward touch counter with supervision.  Verbal & demo cues on technique. Pt amb 75' X 3 with SBQC &  HHA min to modA.  Progressed to no HHA up to 4 consecutive steps.  PT demo to husband how to provide assistance for safety with less support. He verbalized understanding.  PT educated pt in progressing walking endurance with max tolerable until RLE has claudication pain using her rollator walker 1-2 reps daily. Pt verbalized understanding.   Neuromuscular Re-education for balance In //bars with feet hip width apart with pelvic wt shift midline back of Left hand to rail for stability & maintain weight on prosthesis. Mirror for visual feed back, verbal & tactile cues.   On floor eyes open head movements right/left, up/down & diagonals 8 reps ea.  On floor eyes closed head movements right/left, up/down & diagonals 8 reps ea.   On foam eyes open head movements right/left, up/down & diagonals 8 reps ea.    03/19/2022: Prosthetic Training with Transfemoral prosthesis: Pt sit to / from stand pushing off chair seat without armrests use goal to not touch table for support (achieved ~1/3 time) 5 reps 2 sets. Pt amb 61' with SBQC 1st rep HHA, 2nd rep minA without HHA.  Inside //bars: working on initiating gait with RLE as stepping with prosthesis first causes balance issue and ASIS (headlights) facing forward and sequencing moving SBQC prior to prosthesis. Demo, verbal & tactile cues.  Progressed from [place], then [facility] / RUE rail, then [facility] intermittent touch rail. Amb 100' out of clinic with PT cues first half on sequencing & 2nd half on pelvic orientation.  minA except 5 LOBs requiring modA & LUE touch.  Backwards walking with PT demo, mirror & verbal cues on technique.  Inside // bars Progressed from [place] rails, then [facility] / RUE rail, then [facility] intermittent touch rail. Side stepping walking with PT demo, mirror & verbal cues on technique.  Inside // bars Progressed from [place] rails, then [facility] / RUE rail, then [facility] intermittent touch rail. Stationary ADLs with prosthetic foot set back 1" to right toes and  SBQC LUE to increase weight on prosthesis. This also frees her dominant RUE.  Pt verbalized & return demo understanding.    03/12/2022: Prosthetic Training with Transfemoral prosthesis: PT instructed in standing still (stationary) activities to have prosthetic toes back 1-2" to right toes, ASIS (headlights) facing forward and touch LUE as needed to facilitate weight on prosthesis.  Pt verbalized understanding.    Therapeutic Activities: PT instructed in Bag Balm vs Vasoline that she is currently using. Pt verbalized understanding. PT demo & verbal cues on proper elevation and doing alphabet with ankle for muscle activity 3 times during 10-15 elevation. Pt verbalized understanding.    Neuromuscular Re-education for balance Standing in corner with chair back anterior for safety.  PT supervision for safety, verbal cues for upright posture with weight on BLEs. Feet hip width apart.  On floor eyes open head movements right/left, up/down & diagonals 8 reps ea. Intermittent touch for balance.  On floor eyes closed head movements right/left, up/down & diagonals 8 reps ea. LUE touch on chair back for balance & to facilitate weight on prosthesis.   On foam eyes open head movements right/left, up/down &  diagonals 8 reps ea. LUE touch on chair back for balance & to facilitate weight on prosthesis.  -PT added as HEP with verbal, demo & HO cues. Pt verbalized & return demo understanding.    HOME EXERCISE PROGRAM: Access Code: [email or web address] URL: [email or web address] Date: 03/12/2022 Prepared by: Jamey Reas  Exercises - sit to stand to sit with counter  - 2-3 x daily - 7 x weekly - 1 sets - 10 reps - 5 seconds hold - Modified Thomas Stretch  - 2-3 x daily - 7 x weekly - 1 sets - 2-3 reps - 20-30 seconds hold - wide stance head motions eyes open  - 1 x daily - 4-7 x weekly - 1 sets - 10 reps - 2 seconds hold - Feet Apart with Eyes Closed with Head Motions  - 1 x daily - 4-7 x weekly - 1  sets - 10 reps - 2 seconds hold - Wide stance on Foam Pad head movements  - 1 x daily - 4-7 x weekly - 1 sets - 10 reps - 2 seconds hold - Ankle Alphabet in Elevation  - 1 x daily - 7 x weekly - 3 sets - 1 reps - 10-15 minutes hold  Stand in front of sink with one chair back on each side of you and 3rd chair behind you:  Your prosthetic toes should be back ~1" to your right toes.  Both feet facing forward about hip width apart.   Turn in circles using cane.  When going to left lead with left leg.  When going to right, lead with right leg.  Reach both hands over head and return both hands to counter.  Follow your hands with your eyes.  Keep weight on both legs.  Turn your upper body to place item with both hands on back of chair to each side. Stop at counter between directions.  Follow your hands with your eyes.  Keep weight on both legs. Lean to each side placing item in chair bottom with one hand (use right hand going to right & left hand going to left).  Follow your hand with your eyes.  Turn around you are facing chair that was behind you.  Make sure that your back side does not touch counter when you bend forward.  Lean forward to place an item in chair seat then stand back up straight.   Stand up & sit down using chair seat to push up. Goal is to not touch sink unless off balance.   standing Up sit at edge of chair with right foot near chair, left foot /prosthesis with heel touching. push forward to SMELL flower,  keep prosthetic foot in contact with floor.  then straight up back.  Sitting down: stand with right foot near chair & left prosthetic foot forward a little bow to SMELL to flower unlocking prosthetic knee reach with LEFT hand first to sit.    ASSESSMENT: CLINICAL IMPRESSION: Patient had improved sit to/from stand noted today.  She fell when prosthesis off, but has not fallen with prosthesis recently.  Pt appears to understand PT recommendations for progressing walking tolerance  / endurance.  Pt continues to benefit from skilled PT.    OBJECTIVE IMPAIRMENTS: Abnormal gait, decreased activity tolerance, decreased balance, decreased endurance, decreased knowledge of condition, decreased knowledge of use of DME, decreased mobility, difficulty walking, decreased ROM, decreased strength, impaired flexibility, postural dysfunction, prosthetic dependency , and pain.    ACTIVITY LIMITATIONS: carrying, lifting, bending, sitting, standing, sleeping,  stairs, transfers, and locomotion level   PARTICIPATION LIMITATIONS: meal prep, cleaning, laundry, community activity, and yard work   PERSONAL FACTORS: Age, Fitness, Past/current experiences, Time since onset of injury/illness/exacerbation, and 3+ comorbidities: see PMH  are also affecting patient's functional outcome.    REHAB POTENTIAL: Good   CLINICAL DECISION MAKING: Evolving/moderate complexity   EVALUATION COMPLEXITY: Moderate     GOALS: Goals reviewed with patient? Yes   SHORT TERM GOALS: Target date: 04/09/2022   Patient verbalizes & demonstrates understanding of updated HEP. Baseline: SEE OBJECTIVE DATA Goal status: Ongoing 03/26/2022 2.  Patient able to balance 2 min without UE support with supervision. Baseline: SEE OBJECTIVE DATA Goal status: Ongoing 03/26/2022   3.  Patient ambulates 9' with [facility] & prosthesis with minA. Baseline: SEE OBJECTIVE DATA Goal status: Ongoing 03/26/2022     LONG TERM GOALS: Target date: 05/08/2022   Patient demonstrates & verbalized understanding of prosthetic care to enable safe utilization of prosthesis. Baseline: SEE OBJECTIVE DATA Goal status: INITIAL   Patient tolerates prosthesis wear >90% of awake hours without skin or limb pain issues. Baseline: SEE OBJECTIVE DATA Goal status: INITIAL   Berg Balance >/= 36/56 to indicate lower fall risk Baseline: SEE OBJECTIVE DATA Goal status: INITIAL   Patient ambulates >200' with prosthesis & SBQC modified  independently Baseline: SEE OBJECTIVE DATA Goal status: INITIAL   Patient negotiates ramps, curbs & stairs with single rail with prosthesis & SBQC with family safely.  Baseline: SEE OBJECTIVE DATA Goal status: INITIAL   Patient verbalizes & demonstrates understanding of ongoing HEP Baseline: SEE OBJECTIVE DATA Goal status: INITIAL   7.  Patient reports low back pain increases </= 2 increments with above activities. Baseline: SEE OBJECTIVE DATA Goal status: INITIAL   PLAN:   PT FREQUENCY: 1x/week   PT DURATION:  90 days    PLANNED INTERVENTIONS: Therapeutic exercises, Therapeutic activity, Neuromuscular re-education, Balance training, Gait training, Patient/Family education, Self Care, Stair training, Vestibular training, Prosthetic training, DME instructions, Manual therapy, Re-evaluation, and Physical performance testng.   PLAN FOR NEXT SESSION:    continue to  progress prosthetic gait with [facility] & balance with instructions for home.   Jamey Reas, PT, DPT 03/26/2022, 12:47 PM

## 2022-04-02 ENCOUNTER — Encounter: Payer: Self-pay | Admitting: Physical Therapy

## 2022-04-02 ENCOUNTER — Ambulatory Visit (INDEPENDENT_AMBULATORY_CARE_PROVIDER_SITE_OTHER): Payer: Medicare Other | Admitting: Physical Therapy

## 2022-04-02 DIAGNOSIS — R2689 Other abnormalities of gait and mobility: Secondary | ICD-10-CM | POA: Diagnosis not present

## 2022-04-02 DIAGNOSIS — R2681 Unsteadiness on feet: Secondary | ICD-10-CM

## 2022-04-02 DIAGNOSIS — M25652 Stiffness of left hip, not elsewhere classified: Secondary | ICD-10-CM

## 2022-04-02 DIAGNOSIS — R293 Abnormal posture: Secondary | ICD-10-CM

## 2022-04-02 DIAGNOSIS — M6281 Muscle weakness (generalized): Secondary | ICD-10-CM

## 2022-04-02 NOTE — Therapy (Signed)
OUTPATIENT PHYSICAL THERAPY TREATMENT NOTE   Patient Name: [name] MRN: [id number] DOB:24-Jun-1940, 83 y.o., female Today's Date: 04/02/2022  PCP: [name], MD  REFERRING PROVIDER: [name], MD   END OF SESSION:   PT End of Session - 04/02/22 0934     Visit Number 8    Number of Visits 14    Date for PT Re-Evaluation 05/08/22    Authorization Type [employer] Medicare    Authorization Time Period no co-pay, no visit limit    Progress Note Due on Visit 10    PT Start Time 0932    PT Stop Time 1014    PT Time Calculation (min) 42 min    Equipment Utilized During Treatment Gait belt    Activity Tolerance Patient tolerated treatment well    Behavior During Therapy WFL for tasks assessed/performed                  Past Medical History:  Diagnosis Date   Allergy    Anemia    childhood   Anxiety    Aortic stenosis    Arterial stenosis ([place])    mesenteric   Arthritis    Breast cancer ([place]) 12/05/10   R breast, inv mammary, in situ,ER/PR +,HER2 -   Cancer ([place])    Carcinoma of breast treated with adjuvant chemotherapy ([place])    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[place])    Claudication ([place])    Coronary artery disease    stent 2007   Depression    Diabetes mellitus    Difficulty sleeping    Diverticulosis 12/10/03   Elevated cholesterol    Generalized weakness    GERD (gastroesophageal reflux disease)    Heart murmur    Hepatitis    as an infant   HSV-1 (herpes simplex virus 1) infection    Hyperlipidemia    Hypertension    Hypothyroidism    Osteoarthritis    PAD (peripheral artery disease) ([place])    Personal history of chemotherapy    Personal history of radiation therapy    Pneumonia    2014 september and March 2022   Rheumatoid arthritis ([place])    S/P aortic valve replacement with bioprosthetic valve 05/04/2020   [facility] Inspiris Resilia stented bovine pericardial tissue valve, size 23 mm   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   LIMA to D3,  SVG to PDA, EVH via right thigh   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   Dr [name]   Spinal stenosis    Thyroid disease    Past Surgical History:  Procedure Laterality Date   ABDOMINAL AORTOGRAM W/LOWER EXTREMITY Bilateral 04/29/2019   Procedure: ABDOMINAL AORTOGRAM W/LOWER EXTREMITY;  Surgeon: Marty Heck, MD;  Location: [facility];  Service: Cardiovascular;  Laterality: Bilateral;   ABDOMINAL AORTOGRAM W/LOWER EXTREMITY Left 07/01/2019   Procedure: ABDOMINAL AORTOGRAM W/ Left LOWER EXTREMITY [place];  Surgeon: Marty Heck, MD;  Location: [facility];  Service: Cardiovascular;  Laterality: Left;   ABDOMINAL AORTOGRAM W/LOWER EXTREMITY Left 03/03/2021   Procedure: ABDOMINAL AORTOGRAM W/LOWER EXTREMITY;  Surgeon: Cherre Robins, MD;  Location: [facility];  Service: Cardiovascular;  Laterality: Left;   ABDOMINAL AORTOGRAM W/LOWER EXTREMITY Right 06/12/2021   Procedure: ABDOMINAL AORTOGRAM W/LOWER EXTREMITY;  Surgeon: Waynetta Sandy, MD;  Location: [facility];  Service: Cardiovascular;  Laterality: Right;  LOWER LEG   ABDOMINAL AORTOGRAM W/LOWER EXTREMITY N/A 03/07/2022  Procedure: ABDOMINAL AORTOGRAM W/LOWER EXTREMITY;  Surgeon: [name], MD;  Location: [facility];  Service: Cardiovascular;  Laterality: N/A;   AMPUTATION Left 03/06/2021   Procedure: LEFT ABOVE KNEE AMPUTATION;  Surgeon: [name], MD;  Location: [facility];  Service: Vascular;  Laterality: Left;   ANTERIOR AND POSTERIOR REPAIR N/A 04/05/2016   Procedure: ANTERIOR (CYSTOCELE) AND POSTERIOR REPAIR (RECTOCELE);  Surgeon: [name], MD;  Location: [facility];  Service: Gynecology;  Laterality: N/A;   AORTIC VALVE REPLACEMENT N/A 05/04/2020   Procedure: AORTIC VALVE REPLACEMENT (AVR) USING INSPIRIS 23MM RESILIA AORTIC VALVE;  Surgeon: Rexene Alberts, MD;  Location: [facility];  Service: Open Heart Surgery;  Laterality: N/A;   BREAST BIOPSY     BREAST LUMPECTOMY   1983   benign biopsy   BREAST LUMPECTOMY Right 12/29/2010   snbx, ER/PR +, Her2 -, 0/1 node pos.   CARPAL TUNNEL RELEASE Bilateral 9/12,8,12   CHOLECYSTECTOMY     COLONOSCOPY N/A 02/09/2013   Procedure: COLONOSCOPY;  Surgeon: [name], MD;  [facility];  Service: Endoscopy;  Laterality: N/A;   COLONOSCOPY     CORONARY ANGIOPLASTY     CORONARY ARTERY BYPASS GRAFT N/A 05/04/2020   Procedure: CORONARY ARTERY BYPASS GRAFTING (CABG), ON PUMP, TIMES TWO, USING LEFT INTERNAL MAMMARY ARTERY AND RIGHT ENDOSCOPICALLY HARVESTED GREATER SAPHENOUS VEIN;  Surgeon: Rexene Alberts, MD;  Location: [facility];  Service: Open Heart Surgery;  Laterality: N/A;   DILATION AND CURETTAGE OF UTERUS     ESOPHAGOGASTRODUODENOSCOPY N/A 02/09/2013   Procedure: ESOPHAGOGASTRODUODENOSCOPY (EGD);  Surgeon: [name], MD;  Location: [name];  Service: Endoscopy;  Laterality: N/A;   FOOT SPURS     HYSTEROSCOPY WITH D & C N/A 09/11/2012   Procedure: DILATATION AND CURETTAGE ;  Surgeon: [name], MD;  Location: [facility];  Service: Gynecology;  Laterality: N/A;   KNEE SURGERY  2007   LAPAROSCOPIC ASSISTED VAGINAL HYSTERECTOMY N/A 04/05/2016   Procedure: LAPAROSCOPIC ASSISTED VAGINAL HYSTERECTOMY possible BSO;  Surgeon: [name], MD;  Location: [facility];  Service: Gynecology;  Laterality: N/A;   LUMBAR LAMINECTOMY/DECOMPRESSION MICRODISCECTOMY N/A 10/20/2014   Procedure: MICRO LUMBER DECOMPRESSION L3-4 L4-5;  Surgeon: [name], MD;  [facility];  Service: Orthopedics;  Laterality: N/A;   LUMBAR LAMINECTOMY/DECOMPRESSION MICRODISCECTOMY Bilateral 10/13/2015   Procedure: MICRO LUMBAR DECOMPRESSION L5 - S1 AND REDO DECOMPRESSION L4 - L5 AND REMOVAL OF FACET CYST L4 - L5 2 LEVELS;  Surgeon: [name], MD;  [facility];  Service: Orthopedics;  Laterality: Bilateral;   PERIPHERAL VASCULAR ATHERECTOMY Right 06/12/2021   Procedure: PERIPHERAL VASCULAR ATHERECTOMY;  Surgeon: Waynetta Sandy, MD;  Location: [facility];  Service: Cardiovascular;  Laterality: Right;  SFA/POP/AT   PERIPHERAL VASCULAR INTERVENTION Right 04/29/2019   Procedure: PERIPHERAL VASCULAR INTERVENTION;  Surgeon: Marty Heck, MD;  Location: [facility];  Service: Cardiovascular;  Laterality: Right;   PERIPHERAL VASCULAR INTERVENTION Right 06/12/2021   Procedure: PERIPHERAL VASCULAR INTERVENTION;  Surgeon: Waynetta Sandy, MD;  Location: [facility];  Service: Cardiovascular;  Laterality: Right;  SFP/POPLITEAL/AT   PERIPHERAL VASCULAR INTERVENTION  03/07/2022   Procedure: PERIPHERAL VASCULAR INTERVENTION;  Surgeon: [name], MD;  Location: [facility];  Service: Cardiovascular;;   PORT-A-CATH REMOVAL  04/17/2011   Procedure: REMOVAL PORT-A-CATH;  Surgeon: [name], MD;  Location: [facility];  Service: General;  Laterality: Left;   PORTACATH PLACEMENT  02/07/2011  Procedure: INSERTION PORT-A-CATH;  Surgeon: [name], MD;  [facility];  Service: General;  Laterality: N/A;   RIGHT/LEFT HEART CATH AND CORONARY ANGIOGRAPHY N/A 03/17/2020   Procedure: RIGHT/LEFT HEART CATH AND CORONARY ANGIOGRAPHY;  Surgeon: Burnell Blanks, MD;  Location: [facility];  Service: Cardiovascular;  Laterality: N/A;   TEE WITHOUT CARDIOVERSION N/A 05/04/2020   Procedure: TRANSESOPHAGEAL ECHOCARDIOGRAM (TEE);  Surgeon: Rexene Alberts, MD;  Location: [facility];  Service: Open Heart Surgery;  Laterality: N/A;   UPPER GASTROINTESTINAL ENDOSCOPY     Patient Active Problem List   Diagnosis Date Noted   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[place]) 03/13/2021   Sinus tachycardia 03/11/2021   Elevated troponin 03/08/2021   Stroke-like symptoms 03/08/2021   Anemia of chronic disease 03/08/2021   Acute supraglottitis with epiglottitis 02/05/2021   Epiglottitis 02/05/2021   Reactive airway disease 01/20/2021   Diabetic peripheral  neuropathy ([place]) 12/19/2020   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   Primary insomnia 11/02/2020   Irritation of eyelid 07/26/2020   S/P aortic valve replacement with bioprosthetic valve 05/04/2020   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   Peripheral artery disease ([facility])    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[place]) 04/07/2019   Low back pain 02/03/2019   Scoliosis deformity of spine 02/03/2019   Osteoarthritis of hip 11/17/2018   Claudication ([place])    Chronic headaches 11/12/2017   Degeneration of lumbar intervertebral disc 06/13/2017   Hyperkalemia 06/03/2017   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[place])    Mass of left adrenal gland ([place]) 04/19/2017   Spinal stenosis of lumbar region 10/20/2014   Hyperlipidemia 03/16/2013   Dense breasts 12/22/2012   Anxiety state 04/16/2012   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/16/2012   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   Osteoarthritis of knee 04/16/2012   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 female breast ([place]) 12/20/2010   History of breast cancer 12/05/2010   Depression 09/14/2008   GERD 09/14/2008   Type 2 diabetes mellitus with neurologic complication, with long-term current use of insulin ([place]) 05/20/2008   Coronary atherosclerosis 04/17/2007   Hypothyroidism 04/03/2007    REFERRING DIAG: [id number] (ICD-10-CM) - Above knee amputation of left lower extremity   ONSET DATE: 02/02/2022 MD referral to PT   THERAPY DIAG:  Other abnormalities of gait and mobility  Unsteadiness on feet  Muscle weakness (generalized)  Stiffness of left hip, not elsewhere classified  Abnormal posture  Rationale for Evaluation and Treatment Rehabilitation  PERTINENT HISTORY: Left TFA, DM2, neuropathy, PVD, CKD st4, RLE stent, aortic stenosis, CAD, CABG X 2, HTN, OA, lumbar spinal stenosis, breast CA   PRECAUTIONS: Fall  SUBJECTIVE:  SUBJECTIVE STATEMENT:   She has been doing her PT exercises. She has been walking in driveway with cane.    PAIN:  Are you having pain? Yes: NPRS scale: today  0/10 and in last week 0/10, when doing stretch of hip flexors 6/10 Pain location: right lumbar  Pain description: achy Aggravating factors: walking without shoes with rollator Relieving factors: sit down   OBJECTIVE: (objective measures completed at initial evaluation unless otherwise dated) COGNITION: 02/07/2022 / Eval: Overall cognitive status: Within functional limits for tasks assessed   MUSCLE LENGTH: 02/07/2022 / Elizabeth SauerMarcello Moores test: Left -37 deg   POSTURE: 02/07/2022 / Eval:   rounded shoulders, forward head, increased lumbar lordosis, increased thoracic kyphosis, flexed trunk , and weight shift right   LOWER EXTREMITY ROM:   ROM P:passive  A:active Right Eval 02/07/22  Hip flexion    Hip extension Standing -27* excessive lordosis  Hip abduction    Hip adduction    Hip internal rotation    Hip external rotation    Knee flexion    Knee extension    Ankle dorsiflexion    Ankle plantarflexion    Ankle inversion    Ankle eversion     (Blank rows = not tested)   LOWER EXTREMITY MMT:   MMT Right eval Left eval  Hip flexion      Hip extension      Hip abduction      Hip adduction      Hip internal rotation      Hip external rotation      Knee flexion      Knee extension      Ankle dorsiflexion      Ankle plantarflexion      Ankle inversion      Ankle eversion      (Blank rows = not tested)   TRANSFERS: 02/07/2022 / Eval:  Sit to stand: SBA requires UE assist on armrest, prosthetic knee locked delayed in process impairing stabilization upon arising. Uses back of legs against chair & external support to stabilize 02/07/2022 / Eval:  Stand to sit: SBA uses back of legs against chair for stability.  External support required & armrests on chair.    GAIT: 02/07/2022 / Eval: Gait pattern: step to pattern, decreased arm swing- Left, decreased step length- Right, decreased stance time- Left, circumduction- Left, Left hip hike, antalgic, lateral hip instability, trunk flexed, and abducted- Left Distance walked: 100' Assistive device utilized: Lobbyist with HHA (required) and Walker - 4 wheeled Level of assistance: Mod A with SBQC / HHA and SBA / verbal cues for rollator Gait velocity: 1.22 ft/sec with rollator walker   FUNCTIONAL TESTs:  Eval (02/07/2022): Timed up and go (TUG): 28.97 sec with rollator Berg Balance Scale: 21/56  CURRENT PROSTHETIC WEAR ASSESSMENT:  02/07/2022 / Eval: Patient is independent with: skin check, residual limb care, and prosthetic cleaning Patient is dependent with: correct ply sock adjustment, proper wear schedule/adjustment, and proper weight-bearing schedule/adjustment Donning prosthesis: Modified independence per pt report Doffing prosthesis: Modified independence per pt report Prosthetic wear tolerance: ~8 hours/day, 7 days/week (~60-70% of awake hours) Prosthetic weight bearing tolerance: 8 minutes with no c/o limb pain Residual limb condition: pt reports no issues Prosthetic description: silicon liner with suction ring suspension, ischial containment socket with flexible inner socket, non-hydraulic knee,        TODAY'S TREATMENT:  DATE:  04/02/2022: Prosthetic Training with Transfemoral prosthesis: Pt amb 75' X 4 with SBQC & HHA min to modA.  Progressed to no HHA up to 3 consecutive prosthetic steps.  PT recommended only on level, smooth surfaces straight path with her husband.  Initiate with HHA for 2+ steps then pt to let go of husband's hand which he continues to hold just in front of trunk on left side. Pt verbalized  understanding. PT demo & verbal cues on negotiating around obstacles  / turning. Pt neg single cone 5' apart, then double cones for increased turn and then around table for 90* turns. She required modA HHA with [facility] and one LOB backwards with maxA to prevent fall.  Standing posture with back to door frame reaching over head single UEs & BUEs for 2 deep breath hold 2 reps ea.  PT recommending when exiting bathroom 3+ times / day. Pt verbalized understanding.     03/26/2022: Prosthetic Training with Transfemoral prosthesis: Sit to / from stand using UEs on seat of chair & no touch to stabilize.  5 reps 2 sets. Pt amb with counter support LUE & SBQC RUE forward hovering over counter & backward touch counter with supervision.  Verbal & demo cues on technique. Pt amb 75' X 3 with SBQC & HHA min to modA.  Progressed to no HHA up to 4 consecutive steps.  PT demo to husband how to provide assistance for safety with less support. He verbalized understanding.  PT educated pt in progressing walking endurance with max tolerable until RLE has claudication pain using her rollator walker 1-2 reps daily. Pt verbalized understanding.   Neuromuscular Re-education for balance In //bars with feet hip width apart with pelvic wt shift midline back of Left hand to rail for stability & maintain weight on prosthesis. Mirror for visual feed back, verbal & tactile cues.   On floor eyes open head movements right/left, up/down & diagonals 8 reps ea.  On floor eyes closed head movements right/left, up/down & diagonals 8 reps ea.   On foam eyes open head movements right/left, up/down & diagonals 8 reps ea.    03/19/2022: Prosthetic Training with Transfemoral prosthesis: Pt sit to / from stand pushing off chair seat without armrests use goal to not touch table for support (achieved ~1/3 time) 5 reps 2 sets. Pt amb 96' with SBQC 1st rep HHA, 2nd rep minA without HHA.  Inside //bars: working on initiating gait with RLE as  stepping with prosthesis first causes balance issue and ASIS (headlights) facing forward and sequencing moving SBQC prior to prosthesis. Demo, verbal & tactile cues.  Progressed from [place], then [facility] / RUE rail, then [facility] intermittent touch rail. Amb 100' out of clinic with PT cues first half on sequencing & 2nd half on pelvic orientation.  minA except 5 LOBs requiring modA & LUE touch.  Backwards walking with PT demo, mirror & verbal cues on technique.  Inside // bars Progressed from [place] rails, then [facility] / RUE rail, then [facility] intermittent touch rail. Side stepping walking with PT demo, mirror & verbal cues on technique.  Inside // bars Progressed from [place] rails, then [facility] / RUE rail, then [facility] intermittent touch rail. Stationary ADLs with prosthetic foot set back 1" to right toes and SBQC LUE to increase weight on prosthesis. This also frees her dominant RUE.  Pt verbalized & return demo understanding.    HOME EXERCISE PROGRAM: Access Code: [email or web address] URL: [email or web address] Date: 03/12/2022 Prepared by: Jamey Reas  Exercises - sit to stand to sit with counter  - 2-3 x daily - 7 x weekly - 1 sets - 10 reps - 5 seconds hold - Modified Thomas Stretch  - 2-3 x daily - 7 x weekly - 1 sets - 2-3 reps - 20-30 seconds hold - wide stance head motions eyes open  - 1 x daily - 4-7 x weekly - 1 sets - 10 reps - 2 seconds hold - Feet Apart with Eyes Closed with Head Motions  - 1 x daily - 4-7 x weekly - 1 sets - 10 reps - 2 seconds hold - Wide stance on Foam Pad head movements  - 1 x daily - 4-7 x weekly - 1 sets - 10 reps - 2 seconds hold - Ankle Alphabet in Elevation  - 1 x daily - 7 x weekly - 3 sets - 1 reps - 10-15 minutes hold  Stand in front of sink with one chair back on each side of you and 3rd chair behind you:  Your prosthetic toes should be back ~1" to your right toes.  Both feet facing forward about hip width apart.   Turn in circles using cane.  When going to left  lead with left leg.  When going to right, lead with right leg.  Reach both hands over head and return both hands to counter.  Follow your hands with your eyes.  Keep weight on both legs.  Turn your upper body to place item with both hands on back of chair to each side. Stop at counter between directions.  Follow your hands with your eyes.  Keep weight on both legs. Lean to each side placing item in chair bottom with one hand (use right hand going to right & left hand going to left).  Follow your hand with your eyes.  Turn around you are facing chair that was behind you.  Make sure that your back side does not touch counter when you bend forward.  Lean forward to place an item in chair seat then stand back up straight.   Stand up & sit down using chair seat to push up. Goal is to not touch sink unless off balance.   standing Up sit at edge of chair with right foot near chair, left foot /prosthesis with heel touching. push forward to SMELL flower,  keep prosthetic foot in contact with floor.  then straight up back.  Sitting down: stand with right foot near chair & left prosthetic foot forward a little bow to SMELL to flower unlocking prosthetic knee reach with LEFT hand first to sit.    ASSESSMENT: CLINICAL IMPRESSION: Patient improved her ability to neg around obstacles with PT instruction & repetition.  She seems on target to meet STGs next week.  Pt continues to benefit from skilled PT.    OBJECTIVE IMPAIRMENTS: Abnormal gait, decreased activity tolerance, decreased balance, decreased endurance, decreased knowledge of condition, decreased knowledge of use of DME, decreased mobility, difficulty walking, decreased ROM, decreased strength, impaired flexibility, postural dysfunction, prosthetic dependency , and pain.    ACTIVITY LIMITATIONS: carrying, lifting, bending, sitting, standing, sleeping, stairs, transfers, and locomotion level   PARTICIPATION LIMITATIONS: meal prep, cleaning, laundry,  community activity, and yard work   PERSONAL FACTORS: Age, Fitness, Past/current experiences, Time since onset of injury/illness/exacerbation, and 3+ comorbidities: see PMH  are also affecting patient's functional outcome.    REHAB POTENTIAL: Good   CLINICAL DECISION MAKING: Evolving/moderate complexity   EVALUATION COMPLEXITY: Moderate  GOALS: Goals reviewed with patient? Yes   SHORT TERM GOALS: Target date: 04/09/2022   Patient verbalizes & demonstrates understanding of updated HEP. Baseline: SEE OBJECTIVE DATA Goal status: Ongoing 03/26/2022 2.  Patient able to balance 2 min without UE support with supervision. Baseline: SEE OBJECTIVE DATA Goal status: Ongoing 03/26/2022   3.  Patient ambulates 55' with [facility] & prosthesis with minA. Baseline: SEE OBJECTIVE DATA Goal status: Ongoing 03/26/2022     LONG TERM GOALS: Target date: 05/08/2022   Patient demonstrates & verbalized understanding of prosthetic care to enable safe utilization of prosthesis. Baseline: SEE OBJECTIVE DATA Goal status:  Ongoing 04/02/2022   Patient tolerates prosthesis wear >90% of awake hours without skin or limb pain issues. Baseline: SEE OBJECTIVE DATA Goal status: Ongoing 04/02/2022   Berg Balance >/= 36/56 to indicate lower fall risk Baseline: SEE OBJECTIVE DATA Goal status: Ongoing 04/02/2022   Patient ambulates >200' with prosthesis & SBQC modified independently Baseline: SEE OBJECTIVE DATA Goal status:   Ongoing 04/02/2022   Patient negotiates ramps, curbs & stairs with single rail with prosthesis & SBQC with family safely.  Baseline: SEE OBJECTIVE DATA Goal status: Ongoing 04/02/2022   Patient verbalizes & demonstrates understanding of ongoing HEP Baseline: SEE OBJECTIVE DATA Goal status: Ongoing 04/02/2022   7.  Patient reports low back pain increases </= 2 increments with above activities. Baseline: SEE OBJECTIVE DATA Goal status: Ongoing 04/02/2022   PLAN:   PT FREQUENCY: 1x/week   PT  DURATION:  90 days    PLANNED INTERVENTIONS: Therapeutic exercises, Therapeutic activity, Neuromuscular re-education, Balance training, Gait training, Patient/Family education, Self Care, Stair training, Vestibular training, Prosthetic training, DME instructions, Manual therapy, Re-evaluation, and Physical performance testng.   PLAN FOR NEXT SESSION:    check STGs,  continue to  progress prosthetic gait with [facility] & balance with instructions for home.   Jamey Reas, PT, DPT 04/02/2022, 2:10 PM

## 2022-04-03 ENCOUNTER — Ambulatory Visit (INDEPENDENT_AMBULATORY_CARE_PROVIDER_SITE_OTHER): Payer: Medicare Other | Admitting: Family Medicine

## 2022-04-03 ENCOUNTER — Ambulatory Visit: Payer: Medicare Other | Admitting: Family Medicine

## 2022-04-03 VITALS — BP 134/68 | HR 75 | Temp 97.7°F | Ht 63.0 in | Wt 145.2 lb

## 2022-04-03 DIAGNOSIS — Z794 Long term (current) use of insulin: Secondary | ICD-10-CM

## 2022-04-03 DIAGNOSIS — E1142 Type 2 diabetes mellitus with diabetic polyneuropathy: Secondary | ICD-10-CM

## 2022-04-03 DIAGNOSIS — I10 Essential (primary) hypertension: Secondary | ICD-10-CM

## 2022-04-03 DIAGNOSIS — I739 Peripheral vascular disease, unspecified: Secondary | ICD-10-CM | POA: Diagnosis not present

## 2022-04-03 DIAGNOSIS — E039 Hypothyroidism, unspecified: Secondary | ICD-10-CM

## 2022-04-03 DIAGNOSIS — Z1159 Encounter for screening for other viral diseases: Secondary | ICD-10-CM

## 2022-04-03 DIAGNOSIS — N1832 Chronic kidney disease, stage 3b: Secondary | ICD-10-CM

## 2022-04-03 DIAGNOSIS — E785 Hyperlipidemia, unspecified: Secondary | ICD-10-CM | POA: Diagnosis not present

## 2022-04-03 LAB — HEMOGLOBIN A1C: Hgb A1c MFr Bld: 7.4 % — ABNORMAL HIGH (ref 4.6–6.5)

## 2022-04-03 LAB — GLUCOSE, RANDOM: Glucose, Bld: 149 mg/dL — ABNORMAL HIGH (ref 70–99)

## 2022-04-03 NOTE — Assessment & Plan Note (Signed)
TSH has been at goal. Continue levothyroxine 125 mcg daily.

## 2022-04-03 NOTE — Assessment & Plan Note (Signed)
Diabetes has been in adequate control. Continue glipizide 10 mg , semaglutide (Ozempic) 0.5 mg weekly, and insulin glargine 15 units bid.

## 2022-04-03 NOTE — Assessment & Plan Note (Signed)
Mild burning sensation and decreased feeling in her right great toe.

## 2022-04-03 NOTE — Assessment & Plan Note (Signed)
[name] has severe PAD. There is ongoing concern for her right leg, however, currently there is no sign of wounds or infection. We continue to focus on optimal diabetes, hypertension,a nd hyperlipidemia control.

## 2022-04-03 NOTE — Assessment & Plan Note (Signed)
Stable. Continue focus on blood pressure control.

## 2022-04-03 NOTE — Assessment & Plan Note (Signed)
Continue Repatha and Vascepa 1 gm, 2 caps bid.

## 2022-04-03 NOTE — Assessment & Plan Note (Signed)
Diastolic BP is at goal. Continue amlodipine 10 mg daily and metoprolol tartrate 50 mg bid.

## 2022-04-03 NOTE — Progress Notes (Signed)
[facility] [address] [place] [address] Dept: [phone] Dept Fax: [phone]  Chronic Care Office Visit  Subjective:    Patient ID: [name], female    DOB: 03-Mar-1940, 82 y.o..   MRN: [id number]  Chief Complaint  Patient presents with   Medical Management of Chronic Issues    3 month f/u, fasting today.  No concerns.     History of Present Illness:  Patient is in today for reassessment of chronic medical issues.  Ms. [name] has a history of peripheral artery disease. 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  She now has a prosthesis. She is ambulating with either a wheeled-walker or a cane in most situations. She is still in PT and notes she is able to take a few steps on her own with just a cane. She had a recent vascular study of the right leg that continues to show severe PAD.    [name] has a past history of aortic stenosis and coronary artery disease. She had CABG x 2 and aortic valve replacement in 04/2020. She also has hypertension and hyperlipidemia.  She is managed on aspirin 81 mg daily, clopidogrel 75 mg daily, metoprolol tartrate 50 mg bid, and amlodipine 10 mg daily. She has been intolerant of statins and is on Repatha and Vascepa.   [name] has a history of Type 2 diabetes. She is managed on glipizide 10 mg , semaglutide (Ozempic) 0.5 mg weekly, and insulin glargine 15 units bid. Her diabetes is complicated with peripheral neuropathy.   [name] has a history of hypothyroidism. She is managed on levothyroxine 125 mcg daily.    [name] has a history of depression . She is managed on paroxetine 20 mg daily. She notes her mood is doing quite well.  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   Primary insomnia 11/02/2020   Irritation of eyelid 07/26/2020   S/P aortic valve replacement with bioprosthetic valve 05/04/2020   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   Peripheral artery disease (HCC)    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[place]) 04/07/2019   Scoliosis deformity of spine 02/03/2019   Osteoarthritis of hip 11/17/2018   Claudication ([place])    Chronic headaches 11/12/2017   Degeneration of lumbar intervertebral disc 06/13/2017   Hyperkalemia 06/03/2017   Stage 3b chronic kidney disease (CKD) ([place])    Mass of left adrenal gland ([place]) 04/19/2017   Spinal stenosis of lumbar region 10/20/2014   Hyperlipidemia 03/16/2013   Dense breasts 12/22/2012   Anxiety state 04/16/2012   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/16/2012   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   Osteoarthritis of knee 04/16/2012   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 female breast ([place]) 12/20/2010   History of breast cancer 12/05/2010   Depression 09/14/2008   GERD 09/14/2008   Type 2 diabetes mellitus with neurologic complication, with long-term current use of insulin ([place]) 05/20/2008   Coronary atherosclerosis 04/17/2007   Hypothyroidism  04/03/2007   Past Surgical History:  Procedure Laterality Date   ABDOMINAL AORTOGRAM W/LOWER EXTREMITY Bilateral 04/29/2019   Procedure: ABDOMINAL AORTOGRAM W/LOWER EXTREMITY;  Surgeon: Marty Heck, MD;  Location: [facility];  Service: Cardiovascular;  Laterality: Bilateral;   ABDOMINAL AORTOGRAM W/LOWER EXTREMITY Left 07/01/2019   Procedure: ABDOMINAL AORTOGRAM W/ Left LOWER EXTREMITY [place];  Surgeon: Marty Heck, MD;  Location: [facility];  Service: Cardiovascular;  Laterality: Left;   ABDOMINAL AORTOGRAM W/LOWER EXTREMITY Left 03/03/2021   Procedure: ABDOMINAL AORTOGRAM W/LOWER EXTREMITY;  Surgeon: Cherre Robins, MD;  Location: [facility];  Service: Cardiovascular;  Laterality: Left;   ABDOMINAL AORTOGRAM W/LOWER EXTREMITY Right 06/12/2021   Procedure: ABDOMINAL AORTOGRAM W/LOWER EXTREMITY;  Surgeon: Waynetta Sandy, MD;  Location: [facility];  Service: Cardiovascular;  Laterality: Right;  LOWER LEG   ABDOMINAL AORTOGRAM W/LOWER EXTREMITY N/A 03/07/2022   Procedure: ABDOMINAL AORTOGRAM W/LOWER EXTREMITY;  Surgeon: [name], MD;  Location: [facility];  Service: Cardiovascular;  Laterality: N/A;   AMPUTATION Left 03/06/2021   Procedure: LEFT ABOVE KNEE AMPUTATION;  Surgeon: [name], MD;  Location: [facility];  Service: Vascular;  Laterality: Left;   ANTERIOR AND POSTERIOR REPAIR N/A 04/05/2016   Procedure: ANTERIOR (CYSTOCELE) AND POSTERIOR REPAIR (RECTOCELE);  Surgeon: [name], MD;  Location: [facility];  Service: Gynecology;  Laterality: N/A;   AORTIC VALVE REPLACEMENT N/A 05/04/2020   Procedure: AORTIC VALVE REPLACEMENT (AVR) USING INSPIRIS 23MM RESILIA AORTIC VALVE;  Surgeon: Rexene Alberts, MD;  Location: [facility];  Service: Open Heart Surgery;  Laterality: N/A;   BREAST BIOPSY     BREAST LUMPECTOMY  1983   benign biopsy   BREAST LUMPECTOMY Right 12/29/2010   snbx, ER/PR +, Her2 -, 0/1 node pos.   CARPAL TUNNEL RELEASE Bilateral 9/12,8,12   CHOLECYSTECTOMY     COLONOSCOPY N/A 02/09/2013   Procedure: COLONOSCOPY;  Surgeon: [name], MD;  [facility]: [facility];  Service: Endoscopy;  Laterality: N/A;   COLONOSCOPY     CORONARY ANGIOPLASTY     CORONARY ARTERY BYPASS GRAFT N/A 05/04/2020   Procedure: CORONARY ARTERY BYPASS GRAFTING (CABG), ON PUMP, TIMES TWO, USING LEFT INTERNAL MAMMARY ARTERY AND RIGHT ENDOSCOPICALLY HARVESTED  GREATER SAPHENOUS VEIN;  Surgeon: Rexene Alberts, MD;  Location: [facility];  Service: Open Heart Surgery;  Laterality: N/A;   DILATION AND CURETTAGE OF UTERUS     ESOPHAGOGASTRODUODENOSCOPY N/A 02/09/2013   Procedure: ESOPHAGOGASTRODUODENOSCOPY (EGD);  Surgeon: [name], MD;  Location: [name];  Service: Endoscopy;  Laterality: N/A;   FOOT SPURS     HYSTEROSCOPY WITH D & C N/A 09/11/2012   Procedure: DILATATION AND CURETTAGE ;  Surgeon: [name], MD;  Location: [facility];  Service: Gynecology;  Laterality: N/A;   KNEE SURGERY  2007   LAPAROSCOPIC ASSISTED VAGINAL HYSTERECTOMY N/A 04/05/2016   Procedure: LAPAROSCOPIC ASSISTED VAGINAL HYSTERECTOMY possible BSO;  Surgeon: [name], MD;  Location: [facility];  Service: Gynecology;  Laterality: N/A;   LUMBAR LAMINECTOMY/DECOMPRESSION MICRODISCECTOMY N/A 10/20/2014   Procedure: MICRO LUMBER DECOMPRESSION L3-4 L4-5;  Surgeon: [name], MD;  Location: [facility];  Service: Orthopedics;  Laterality: N/A;   LUMBAR LAMINECTOMY/DECOMPRESSION MICRODISCECTOMY Bilateral 10/13/2015   Procedure: MICRO LUMBAR DECOMPRESSION L5 - S1 AND REDO DECOMPRESSION L4 - L5 AND REMOVAL OF FACET CYST L4 - L5 2 LEVELS;  Surgeon: [name], MD;  Location: [facility];  Service: Orthopedics;  Laterality: Bilateral;  PERIPHERAL VASCULAR ATHERECTOMY Right 06/12/2021   Procedure: PERIPHERAL VASCULAR ATHERECTOMY;  Surgeon: Waynetta Sandy, MD;  Location: [facility];  Service: Cardiovascular;  Laterality: Right;  SFA/POP/AT   PERIPHERAL VASCULAR INTERVENTION Right 04/29/2019   Procedure: PERIPHERAL VASCULAR INTERVENTION;  Surgeon: Marty Heck, MD;  Location: [facility];  Service: Cardiovascular;  Laterality: Right;   PERIPHERAL VASCULAR INTERVENTION Right 06/12/2021   Procedure: PERIPHERAL VASCULAR INTERVENTION;  Surgeon: Waynetta Sandy, MD;  Location: [facility];  Service: Cardiovascular;  Laterality: Right;  SFP/POPLITEAL/AT    PERIPHERAL VASCULAR INTERVENTION  03/07/2022   Procedure: PERIPHERAL VASCULAR INTERVENTION;  Surgeon: [name], MD;  Location: [facility];  Service: Cardiovascular;;   PORT-A-CATH REMOVAL  04/17/2011   Procedure: REMOVAL PORT-A-CATH;  Surgeon: [name], MD;  Location: [facility];  Service: General;  Laterality: Left;   PORTACATH PLACEMENT  02/07/2011   Procedure: INSERTION PORT-A-CATH;  Surgeon: [name], MD;  [facility]: [facility];  Service: General;  Laterality: N/A;   RIGHT/LEFT HEART CATH AND CORONARY ANGIOGRAPHY N/A 03/17/2020   Procedure: RIGHT/LEFT HEART CATH AND CORONARY ANGIOGRAPHY;  Surgeon: Burnell Blanks, MD;  Location: [facility];  Service: Cardiovascular;  Laterality: N/A;   TEE WITHOUT CARDIOVERSION N/A 05/04/2020   Procedure: TRANSESOPHAGEAL ECHOCARDIOGRAM (TEE);  Surgeon: Rexene Alberts, MD;  Location: [facility];  Service: Open Heart Surgery;  Laterality: N/A;   UPPER GASTROINTESTINAL ENDOSCOPY     Family History  Problem Relation Age of Onset   Kidney disease Mother    Heart disease Mother    Throat cancer Father    Alcoholism Father    Heart attack Father    Lymphoma Brother 37   Heart attack Brother    Diabetes Brother    Hypertension Brother    Cancer Son    Colon cancer Neg Hx    Stroke Neg Hx    Esophageal cancer Neg Hx    Rectal cancer Neg Hx    Stomach cancer Neg Hx    Outpatient Medications Prior to Visit  Medication Sig Dispense Refill   Accu-Chek FastClix Lancets MISC USE TO CHECK BLOOD SUGARS 3 TIMES DAILY 306 each 3   amLODipine (NORVASC) 10 MG tablet Take 10 mg by mouth daily.     aspirin EC 81 MG tablet Take 81 mg by mouth in the morning.     blood glucose meter kit and supplies Dispense based on patient and insurance preference. Use up to four times daily as directed. (FOR ICD-10 E10.9, E11.9). 1 each 0   Cholecalciferol (D3 5000 PO) Take 5,000 Units by mouth daily.     clopidogrel (PLAVIX)  75 MG tablet Take 1 tablet (75 mg total) by mouth daily. 30 tablet 11   glipiZIDE (GLUCOTROL XL) 10 MG 24 hr tablet Take 1 tablet (10 mg total) by mouth daily with breakfast. 90 tablet 3   glucose blood (ACCU-CHEK GUIDE) test strip TEST ONCE DAILY 100 each 5   Golimumab (SIMPONI ARIA IV) Inject into the vein every 2 (two) months.     Insulin Pen Needle (CAREFINE PEN NEEDLES) 32G X 6 MM MISC 1 application by Does not apply route 2 (two) times daily. 120 each 1   LANTUS SOLOSTAR 100 UNIT/ML Solostar Pen ADMINISTER 15 UNITS UNDER THE SKIN TWICE DAILY 15 mL 3   levothyroxine (SYNTHROID) 125 MCG tablet TAKE 1 TABLET BY MOUTH DAILY 90 tablet 3   metoprolol tartrate (LOPRESSOR) 50 MG  tablet Take 1 tablet (50 mg total) by mouth 2 (two) times daily. 30 tablet 0   pantoprazole (PROTONIX) 40 MG tablet TAKE 1 TABLET BY MOUTH DAILY 90 tablet 3   PARoxetine (PAXIL) 20 MG tablet TAKE 1 TABLET BY MOUTH IN THE  MORNING 90 tablet 3   REPATHA SURECLICK [id number] MG/ML SOAJ INJECT 1 PEN INTO THE SKIN EVERY 14 DAYS 2 mL 11   Semaglutide,0.25 or 0.'5MG'$ /DOS, 2 MG/1.5ML SOPN Inject 0.5 mg into the skin every Tuesday. 3 mL 6   valACYclovir (VALTREX) 1000 MG tablet Take 1 tablet (1,000 mg total) by mouth daily as needed (Fever Blisters). 180 tablet 0   VASCEPA 1 g capsule Take 2 capsules (2 g total) by mouth 2 (two) times daily. 120 capsule 3   No facility-administered medications prior to visit.   Allergies  Allergen Reactions   Codeine Nausea And Vomiting and Other (See Comments)    Severe stomach cramps, also Other reaction(s): Unknown   Antihistamines, Diphenhydramine-Type Other (See Comments)    Causes hyperactivity   Diphenhydramine     Other reaction(s): Unknown   Folic Acid Other (See Comments)    Other reaction(s): tongue pain   Gabapentin     Urinary incontinence   Lyrica [Pregabalin]     Double vision/falls   Methotrexate Other (See Comments)    Other reaction(s): oral ulcers   Oxycodone      Hallucinations/crazy feelings   Rheumate [Fish Oil]     Other reaction(s): hypoglycemia   Statins Other (See Comments)    Joint pains Other reaction(s): Unknown   Sulfa Antibiotics Other (See Comments)    Reaction not recalled   Erythromycin Hives, Rash and Other (See Comments)    Allergic due to dental work from 50 years ago. It was used in packing and resulted in rash/hives inside and outside of mouth.   Trulicity [Dulaglutide] Itching   Objective:   Today's Vitals   04/03/22 0753  BP: 134/68  Pulse: 75  Temp: 97.7 F (36.5 C)  TempSrc: Temporal  SpO2: 96%  Weight: 145 lb 3.2 oz (65.9 kg)  Height: '5\' 3"'$  (1.6 m)   Body mass index is 25.72 kg/m.   General: Well developed, well nourished. No acute distress. Feet- Exam of right foot only, as she has had a left BKA. Right foot with decreased sensation at the great   toe. Mild maceration between the 4th and 5th toe. PT pulse is not palpable. Psych: Alert and oriented. Normal mood and affect.  Health Maintenance Due  Topic Date Due   Hepatitis C Screening  Never done   DTaP/Tdap/Td (1 - Tdap) Never done   Zoster Vaccines- Shingrix (2 of 2) 03/29/2011   FOOT EXAM  02/02/2022     Assessment & Plan:   Problem List Items Addressed This Visit       Cardiovascular and Mediastinum   Essential hypertension    Diastolic BP is at goal. Continue amlodipine 10 mg daily and metoprolol tartrate 50 mg bid.      Peripheral artery disease ([place]) - Primary    [name] has severe PAD. There is ongoing concern for her right leg, however, currently there is no sign of wounds or infection. We continue to focus on optimal diabetes, hypertension,a nd hyperlipidemia control.        Endocrine   Hypothyroidism (Chronic)    TSH has been at goal. Continue levothyroxine 125 mcg daily.      Type 2 diabetes mellitus with neurologic  complication, with long-term current use of insulin ([place])    Diabetes has been in adequate control. Continue  glipizide 10 mg , semaglutide (Ozempic) 0.5 mg weekly, and insulin glargine 15 units bid.      Relevant Orders   Glucose, random   Hemoglobin A1c   Diabetic peripheral neuropathy (HCC)    Mild burning sensation and decreased feeling in her right great toe.         Genitourinary   Stage 3b chronic kidney disease (CKD) (HCC)    Stable. Continue focus on blood pressure control.        Other   Hyperlipidemia    Continue Repatha and Vascepa 1 gm, 2 caps bid.      Other Visit Diagnoses     Encounter for hepatitis C screening test for low risk patient       Relevant Orders   HCV Ab w Reflex to Quant PCR       Return in about 3 months (around 07/04/2022) for Reassessment.   [name], MD

## 2022-04-05 LAB — HCV INTERPRETATION

## 2022-04-05 LAB — HCV AB W REFLEX TO QUANT PCR: HCV Ab: NONREACTIVE

## 2022-04-05 NOTE — Progress Notes (Signed)
[facility]  HISTORY AND PHYSICAL   History of Present Illness:  Patient is a 82 y.o. year old female well-known to our office, who presents status post balloon angioplasty of in-stent restenosis involving the right superficial femoral artery, popliteal artery, anterior tibial artery stents.  Has had multiple interventions, over the last year involving the right lower extremity due to stent occlusion.  On exam today, he was doing well, accompanied by her husband.  She has done well since her most recent recanalization.  She denies right lower extremity rest pain, no wounds.  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  Patient continues to ambulate with a walker, and uses a left-sided above-knee prosthesis.  She remains on aspirin Plavix does have a severe allergy to statins now on Repatha.   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[place])    Claudication ([place])    Coronary artery disease    stent 2007   Depression    Diabetes mellitus    Difficulty sleeping    Diverticulosis 12/10/03   Elevated cholesterol    Generalized weakness    GERD (gastroesophageal reflux disease)    Heart murmur    Hepatitis    as an infant   HSV-1 (herpes simplex virus 1) infection    Hyperlipidemia    Hypertension    Hypothyroidism    Osteoarthritis    PAD (peripheral artery disease) ([place])    Personal history of chemotherapy    Personal history of radiation therapy    Pneumonia    2014 september and March 2022   Rheumatoid arthritis ([place])    S/P aortic valve replacement with bioprosthetic valve 05/04/2020   [facility] Inspiris Resilia stented bovine pericardial  tissue valve, size 23 mm   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   LIMA to D3, SVG to PDA, EVH via right thigh   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   Dr [name]   Spinal stenosis    Thyroid disease     Past Surgical History:  Procedure Laterality Date   ABDOMINAL AORTOGRAM W/LOWER EXTREMITY Bilateral 04/29/2019   Procedure: ABDOMINAL AORTOGRAM W/LOWER EXTREMITY;  Surgeon: Marty Heck, MD;  Location: [facility];  Service: Cardiovascular;  Laterality: Bilateral;   ABDOMINAL AORTOGRAM W/LOWER EXTREMITY Left 07/01/2019   Procedure: ABDOMINAL AORTOGRAM W/ Left LOWER EXTREMITY [place];  Surgeon: Marty Heck, MD;  Location: [facility];  Service: Cardiovascular;  Laterality: Left;   ABDOMINAL AORTOGRAM W/LOWER EXTREMITY Left 03/03/2021   Procedure: ABDOMINAL AORTOGRAM W/LOWER EXTREMITY;  Surgeon: Cherre Yanissa Michalsky, MD;  Location: [facility];  Service: Cardiovascular;  Laterality: Left;   ABDOMINAL AORTOGRAM W/LOWER EXTREMITY Right 06/12/2021   Procedure: ABDOMINAL AORTOGRAM W/LOWER EXTREMITY;  Surgeon: Waynetta Sandy, MD;  Location: [facility];  Service: Cardiovascular;  Laterality: Right;  LOWER LEG   ABDOMINAL AORTOGRAM W/LOWER EXTREMITY N/A 03/07/2022   Procedure: ABDOMINAL AORTOGRAM W/LOWER EXTREMITY;  Surgeon: [name], MD;  Location: [facility];  Service: Cardiovascular;  Laterality: N/A;  AMPUTATION Left 03/06/2021   Procedure: LEFT ABOVE KNEE AMPUTATION;  Surgeon: [name], MD;  Location: [facility];  Service: Vascular;  Laterality: Left;   ANTERIOR AND POSTERIOR REPAIR N/A 04/05/2016   Procedure: ANTERIOR (CYSTOCELE) AND POSTERIOR REPAIR (RECTOCELE);  Surgeon: [name], MD;  Location: [facility];  Service: Gynecology;  Laterality: N/A;   AORTIC VALVE REPLACEMENT N/A 05/04/2020   Procedure: AORTIC VALVE REPLACEMENT (AVR) USING INSPIRIS 23MM RESILIA AORTIC VALVE;  Surgeon: Rexene Alberts, MD;  Location: [facility];  Service: Open  Heart Surgery;  Laterality: N/A;   BREAST BIOPSY     BREAST LUMPECTOMY  1983   benign biopsy   BREAST LUMPECTOMY Right 12/29/2010   snbx, ER/PR +, Her2 -, 0/1 node pos.   CARPAL TUNNEL RELEASE Bilateral 9/12,8,12   CHOLECYSTECTOMY     COLONOSCOPY N/A 02/09/2013   Procedure: COLONOSCOPY;  Surgeon: [name], MD;  Location: [facility];  Service: Endoscopy;  Laterality: N/A;   COLONOSCOPY     CORONARY ANGIOPLASTY     CORONARY ARTERY BYPASS GRAFT N/A 05/04/2020   Procedure: CORONARY ARTERY BYPASS GRAFTING (CABG), ON PUMP, TIMES TWO, USING LEFT INTERNAL MAMMARY ARTERY AND RIGHT ENDOSCOPICALLY HARVESTED GREATER SAPHENOUS VEIN;  Surgeon: Rexene Alberts, MD;  Location: [facility];  Service: Open Heart Surgery;  Laterality: N/A;   DILATION AND CURETTAGE OF UTERUS     ESOPHAGOGASTRODUODENOSCOPY N/A 02/09/2013   Procedure: ESOPHAGOGASTRODUODENOSCOPY (EGD);  Surgeon: [name], MD;  Location: [name];  Service: Endoscopy;  Laterality: N/A;   FOOT SPURS     HYSTEROSCOPY WITH D & C N/A 09/11/2012   Procedure: DILATATION AND CURETTAGE ;  Surgeon: [name], MD;  Location: [facility];  Service: Gynecology;  Laterality: N/A;   KNEE SURGERY  2007   LAPAROSCOPIC ASSISTED VAGINAL HYSTERECTOMY N/A 04/05/2016   Procedure: LAPAROSCOPIC ASSISTED VAGINAL HYSTERECTOMY possible BSO;  Surgeon: [name], MD;  Location: [facility];  Service: Gynecology;  Laterality: N/A;   LUMBAR LAMINECTOMY/DECOMPRESSION MICRODISCECTOMY N/A 10/20/2014   Procedure: MICRO LUMBER DECOMPRESSION L3-4 L4-5;  Surgeon: [name], MD;  Location: [facility];  Service: Orthopedics;  Laterality: N/A;   LUMBAR LAMINECTOMY/DECOMPRESSION MICRODISCECTOMY Bilateral 10/13/2015   Procedure: MICRO LUMBAR DECOMPRESSION L5 - S1 AND REDO DECOMPRESSION L4 - L5 AND REMOVAL OF FACET CYST L4 - L5 2 LEVELS;  Surgeon: [name], MD;  Location: [facility];  Service: Orthopedics;  Laterality: Bilateral;   PERIPHERAL VASCULAR ATHERECTOMY Right 06/12/2021    Procedure: PERIPHERAL VASCULAR ATHERECTOMY;  Surgeon: Waynetta Sandy, MD;  Location: [facility];  Service: Cardiovascular;  Laterality: Right;  SFA/POP/AT   PERIPHERAL VASCULAR INTERVENTION Right 04/29/2019   Procedure: PERIPHERAL VASCULAR INTERVENTION;  Surgeon: Marty Heck, MD;  Location: [facility];  Service: Cardiovascular;  Laterality: Right;   PERIPHERAL VASCULAR INTERVENTION Right 06/12/2021   Procedure: PERIPHERAL VASCULAR INTERVENTION;  Surgeon: Waynetta Sandy, MD;  Location: [facility];  Service: Cardiovascular;  Laterality: Right;  SFP/POPLITEAL/AT   PERIPHERAL VASCULAR INTERVENTION  03/07/2022   Procedure: PERIPHERAL VASCULAR INTERVENTION;  Surgeon: [name], MD;  Location: [facility];  Service: Cardiovascular;;   PORT-A-CATH REMOVAL  04/17/2011   Procedure: REMOVAL PORT-A-CATH;  Surgeon: [name], MD;  Location: [facility];  Service: General;  Laterality: Left;   PORTACATH PLACEMENT  02/07/2011   Procedure: INSERTION PORT-A-CATH;  Surgeon: [name], MD;  [facility];  Service: General;  Laterality: N/A;   RIGHT/LEFT HEART CATH AND  CORONARY ANGIOGRAPHY N/A 03/17/2020   Procedure: RIGHT/LEFT HEART CATH AND CORONARY ANGIOGRAPHY;  Surgeon: Burnell Blanks, MD;  Location: [facility];  Service: Cardiovascular;  Laterality: N/A;   TEE WITHOUT CARDIOVERSION N/A 05/04/2020   Procedure: TRANSESOPHAGEAL ECHOCARDIOGRAM (TEE);  Surgeon: Rexene Alberts, MD;  Location: [facility];  Service: Open Heart Surgery;  Laterality: N/A;   UPPER GASTROINTESTINAL ENDOSCOPY      ROS:   General:  No weight loss, Fever, chills  HEENT: No recent headaches, no nasal bleeding, no visual changes, no sore throat  Neurologic: No dizziness, blackouts, seizures. No recent symptoms of stroke or mini- stroke. No recent episodes of slurred speech, or temporary blindness.  Cardiac: No recent episodes of  chest pain/pressure, no shortness of breath at rest.  No shortness of breath with exertion.  Denies history of atrial fibrillation or irregular heartbeat  Vascular: No history of rest pain in feet.  positive history of claudication.  Positive history of non-healing ulcer, No history of DVT   Pulmonary: No home oxygen, no productive cough, no hemoptysis,  No asthma or wheezing  Musculoskeletal:  [ ]  Arthritis, [ ]  Low back pain,  [ ]  Joint pain  Hematologic:No history of hypercoagulable state.  No history of easy bleeding.  No history of anemia  Gastrointestinal: No hematochezia or melena,  No gastroesophageal reflux, no trouble swallowing  Urinary: [ ]  chronic Kidney disease, [ ]  on HD - [ ]  MWF or [ ]  TTHS, [ ]  Burning with urination, [ ]  Frequent urination, [ ]  Difficulty urinating;   Skin: No rashes  Psychological: No history of anxiety,  No history of depression  Social History Social History   Tobacco Use   Smoking status: Former    Packs/day: 2.00    Years: 40.00    Additional pack years: 0.00    Total pack years: 80.00    Types: Cigarettes    Quit date: 12/19/1997    Years since quitting: 24.3   Smokeless tobacco: Never  Vaping Use   Vaping Use: Never used  Substance Use Topics   Alcohol use: Yes    Alcohol/week: 1.0 standard drink of alcohol    Types: 1 Glasses of wine per week    Comment: occasiona - maybe once a month   Drug use: No    Family History Family History  Problem Relation Age of Onset   Kidney disease Mother    Heart disease Mother    Throat cancer Father    Alcoholism Father    Heart attack Father    Lymphoma Brother 47   Heart attack Brother    Diabetes Brother    Hypertension Brother    Cancer Son    Colon cancer Neg Hx    Stroke Neg Hx    Esophageal cancer Neg Hx    Rectal cancer Neg Hx    Stomach cancer Neg Hx     Allergies  Allergies  Allergen Reactions   Codeine Nausea And Vomiting and Other (See Comments)    Severe stomach  cramps, also Other reaction(s): Unknown   Antihistamines, Diphenhydramine-Type Other (See Comments)    Causes hyperactivity   Diphenhydramine     Other reaction(s): Unknown   Folic Acid Other (See Comments)    Other reaction(s): tongue pain   Gabapentin     Urinary incontinence   Lyrica [Pregabalin]     Double vision/falls   Methotrexate Other (See Comments)    Other reaction(s): oral ulcers   Oxycodone  Hallucinations/crazy feelings   Rheumate [Fish Oil]     Other reaction(s): hypoglycemia   Statins Other (See Comments)    Joint pains Other reaction(s): Unknown   Sulfa Antibiotics Other (See Comments)    Reaction not recalled   Erythromycin Hives, Rash and Other (See Comments)    Allergic due to dental work from 50 years ago. It was used in packing and resulted in rash/hives inside and outside of mouth.   Trulicity [Dulaglutide] Itching     Current Outpatient Medications  Medication Sig Dispense Refill   Accu-Chek FastClix Lancets MISC USE TO CHECK BLOOD SUGARS 3 TIMES DAILY 306 each 3   amLODipine (NORVASC) 10 MG tablet Take 10 mg by mouth daily.     aspirin EC 81 MG tablet Take 81 mg by mouth in the morning.     blood glucose meter kit and supplies Dispense based on patient and insurance preference. Use up to four times daily as directed. (FOR ICD-10 E10.9, E11.9). 1 each 0   Cholecalciferol (D3 5000 PO) Take 5,000 Units by mouth daily.     clopidogrel (PLAVIX) 75 MG tablet Take 1 tablet (75 mg total) by mouth daily. 30 tablet 11   glipiZIDE (GLUCOTROL XL) 10 MG 24 hr tablet Take 1 tablet (10 mg total) by mouth daily with breakfast. 90 tablet 3   glucose blood (ACCU-CHEK GUIDE) test strip TEST ONCE DAILY 100 each 5   Golimumab (SIMPONI ARIA IV) Inject into the vein every 2 (two) months.     Insulin Pen Needle (CAREFINE PEN NEEDLES) 32G X 6 MM MISC 1 application by Does not apply route 2 (two) times daily. 120 each 1   LANTUS SOLOSTAR 100 UNIT/ML Solostar Pen ADMINISTER  15 UNITS UNDER THE SKIN TWICE DAILY 15 mL 3   levothyroxine (SYNTHROID) 125 MCG tablet TAKE 1 TABLET BY MOUTH DAILY 90 tablet 3   metoprolol tartrate (LOPRESSOR) 50 MG tablet Take 1 tablet (50 mg total) by mouth 2 (two) times daily. 30 tablet 0   pantoprazole (PROTONIX) 40 MG tablet TAKE 1 TABLET BY MOUTH DAILY 90 tablet 3   PARoxetine (PAXIL) 20 MG tablet TAKE 1 TABLET BY MOUTH IN THE  MORNING 90 tablet 3   REPATHA SURECLICK [id number] MG/ML SOAJ INJECT 1 PEN INTO THE SKIN EVERY 14 DAYS 2 mL 11   Semaglutide,0.25 or 0.5MG /DOS, 2 MG/1.5ML SOPN Inject 0.5 mg into the skin every Tuesday. 3 mL 6   valACYclovir (VALTREX) 1000 MG tablet Take 1 tablet (1,000 mg total) by mouth daily as needed (Fever Blisters). 180 tablet 0   VASCEPA 1 g capsule Take 2 capsules (2 g total) by mouth 2 (two) times daily. 120 capsule 3   No current facility-administered medications for this visit.    Physical Examination  There were no vitals filed for this visit.   There is no height or weight on file to calculate BMI.  General:  Alert and oriented, no acute distress HEENT: Normal Neck: No bruit or JVD Pulmonary: Clear to auscultation bilaterally Cardiac: Regular Rate and Rhythm without murmur Abdomen: Soft, non-tender, non-distended, no mass, no scars Skin: No rash, no ischemic changes right LE Extremity Pulses:  2+ radial,  femoral pulses bilaterally Musculoskeletal: No deformity or edema, right AKA prothesis  Neurologic: Upper and lower extremity motor grossly intact and symmetric  DATA:   Summary:  Right: 50-74% stenosis noted in the posterior tibial artery. Patent stent  with no evidence of stenosis in the superficial femoral artery/popliteal  artery  and anterior tibial artery.   +-------+-----------+-----------+------------+------------+  ABI/TBIToday's ABIToday's TBIPrevious ABIPrevious TBI  +-------+-----------+-----------+------------+------------+  Right 0.92       0.48       0.46         0.15          +-------+-----------+-----------+------------+------------+  Left  BKA        BKA        BKA         BKA           +-------+-----------+-----------+------------+------------+   ASSESSMENT/PLAN:   Patient is a 82 y.o. year old female who presents for evaluation of right LE PAD.  Most recent intervention was recanalization of previously thrombosed stents extending from the superficial femoral artery through the anterior tibial artery.  This required both femoral and pedal access.  On exam, Margean was doing well.  No pain in the right lower extremity. Some neuropathy in the 1st toe. She has an excellent DP pulse.  I had a long discussion with her and her husband today regarding the above.  Unfortunately, with the extent of her stenting, I do not think her current repair will be durable, due to poor outflow she had no open surgical options. She had an 63-month period between interventions last time.  She is aware she is at high risk for amputation moving forward as she did not have adequate outflow prior to revascularization for bypass surgery. My plan is to see her on a 77-month basis to ensure there is no in-stent restenosis which requires intervention.  I asked her and her husband to assess her foot on a nightly basis, as she has a palpable dorsalis pedis pulse.  I asked him to call should any questions or concerns arise    [name] [facility] [place] Office: [phone]

## 2022-04-06 ENCOUNTER — Ambulatory Visit (INDEPENDENT_AMBULATORY_CARE_PROVIDER_SITE_OTHER): Payer: Medicare Other | Admitting: Vascular Surgery

## 2022-04-06 ENCOUNTER — Ambulatory Visit (HOSPITAL_COMMUNITY)
Admission: RE | Admit: 2022-04-06 | Discharge: 2022-04-06 | Disposition: A | Discharge status: Home / Self Care | Payer: Medicare Other | Source: Ambulatory Visit | Attending: Vascular Surgery | Admitting: Vascular Surgery

## 2022-04-06 ENCOUNTER — Encounter: Payer: Self-pay | Admitting: Vascular Surgery

## 2022-04-06 ENCOUNTER — Ambulatory Visit (INDEPENDENT_AMBULATORY_CARE_PROVIDER_SITE_OTHER)
Admission: RE | Admit: 2022-04-06 | Discharge: 2022-04-06 | Disposition: A | Discharge status: Home / Self Care | Payer: Medicare Other | Source: Ambulatory Visit | Attending: Vascular Surgery | Admitting: Vascular Surgery

## 2022-04-06 VITALS — BP 160/80 | HR 77 | Temp 97.9°F | Resp 20 | Ht 63.0 in | Wt 144.0 lb

## 2022-04-06 DIAGNOSIS — I739 Peripheral vascular disease, unspecified: Secondary | ICD-10-CM

## 2022-04-06 DIAGNOSIS — Z9889 Other specified postprocedural states: Secondary | ICD-10-CM | POA: Diagnosis not present

## 2022-04-06 LAB — VAS US ABI WITH/WO TBI: Right ABI: 0.92

## 2022-04-09 ENCOUNTER — Other Ambulatory Visit: Payer: Self-pay

## 2022-04-09 ENCOUNTER — Ambulatory Visit (INDEPENDENT_AMBULATORY_CARE_PROVIDER_SITE_OTHER): Payer: Medicare Other | Admitting: Physical Therapy

## 2022-04-09 ENCOUNTER — Encounter: Payer: Self-pay | Admitting: Physical Therapy

## 2022-04-09 DIAGNOSIS — I739 Peripheral vascular disease, unspecified: Secondary | ICD-10-CM

## 2022-04-09 DIAGNOSIS — R2689 Other abnormalities of gait and mobility: Secondary | ICD-10-CM | POA: Diagnosis not present

## 2022-04-09 DIAGNOSIS — M6281 Muscle weakness (generalized): Secondary | ICD-10-CM

## 2022-04-09 DIAGNOSIS — R2681 Unsteadiness on feet: Secondary | ICD-10-CM | POA: Diagnosis not present

## 2022-04-09 DIAGNOSIS — R293 Abnormal posture: Secondary | ICD-10-CM

## 2022-04-09 DIAGNOSIS — M25652 Stiffness of left hip, not elsewhere classified: Secondary | ICD-10-CM | POA: Diagnosis not present

## 2022-04-09 NOTE — Therapy (Signed)
OUTPATIENT PHYSICAL THERAPY TREATMENT NOTE   Patient Name: [name] MRN: [id number] DOB:February 23, 1940, 82 y.o., female Today's Date: 04/09/2022  PCP: [name], MD  REFERRING PROVIDER: [name], MD   END OF SESSION:   PT End of Session - 04/09/22 0937     Visit Number 9    Number of Visits 14    Date for PT Re-Evaluation 05/08/22    Authorization Type [employer] Medicare    Authorization Time Period no co-pay, no visit limit    Progress Note Due on Visit 10    PT Start Time 0932    PT Stop Time 1010    PT Time Calculation (min) 38 min    Equipment Utilized During Treatment Gait belt    Activity Tolerance Patient tolerated treatment well    Behavior During Therapy WFL for tasks assessed/performed                   Past Medical History:  Diagnosis Date   Allergy    Anemia    childhood   Anxiety    Aortic stenosis    Arterial stenosis ([place])    mesenteric   Arthritis    Breast cancer ([place]) 12/05/10   R breast, inv mammary, in situ,ER/PR +,HER2 -   Cancer ([place])    Carcinoma of breast treated with adjuvant chemotherapy ([place])    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[place])    Claudication ([place])    Coronary artery disease    stent 2007   Depression    Diabetes mellitus    Difficulty sleeping    Diverticulosis 12/10/03   Elevated cholesterol    Generalized weakness    GERD (gastroesophageal reflux disease)    Heart murmur    Hepatitis    as an infant   HSV-1 (herpes simplex virus 1) infection    Hyperlipidemia    Hypertension    Hypothyroidism    Osteoarthritis    PAD (peripheral artery disease) ([place])    Personal history of chemotherapy    Personal history of radiation therapy    Pneumonia    2014 september and March 2022   Rheumatoid arthritis ([place])    S/P aortic valve replacement with bioprosthetic valve 05/04/2020   [facility] Inspiris Resilia stented bovine pericardial tissue valve, size 23 mm   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   LIMA to D3,  SVG to PDA, EVH via right thigh   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   Dr [name]   Spinal stenosis    Thyroid disease    Past Surgical History:  Procedure Laterality Date   ABDOMINAL AORTOGRAM W/LOWER EXTREMITY Bilateral 04/29/2019   Procedure: ABDOMINAL AORTOGRAM W/LOWER EXTREMITY;  Surgeon: Marty Heck, MD;  Location: [facility];  Service: Cardiovascular;  Laterality: Bilateral;   ABDOMINAL AORTOGRAM W/LOWER EXTREMITY Left 07/01/2019   Procedure: ABDOMINAL AORTOGRAM W/ Left LOWER EXTREMITY [place];  Surgeon: Marty Heck, MD;  Location: [facility];  Service: Cardiovascular;  Laterality: Left;   ABDOMINAL AORTOGRAM W/LOWER EXTREMITY Left 03/03/2021   Procedure: ABDOMINAL AORTOGRAM W/LOWER EXTREMITY;  Surgeon: Cherre Robins, MD;  Location: [facility];  Service: Cardiovascular;  Laterality: Left;   ABDOMINAL AORTOGRAM W/LOWER EXTREMITY Right 06/12/2021   Procedure: ABDOMINAL AORTOGRAM W/LOWER EXTREMITY;  Surgeon: Waynetta Sandy, MD;  Location: [facility];  Service: Cardiovascular;  Laterality: Right;  LOWER LEG   ABDOMINAL AORTOGRAM W/LOWER EXTREMITY N/A 03/07/2022  Procedure: ABDOMINAL AORTOGRAM W/LOWER EXTREMITY;  Surgeon: [name], MD;  Location: [place] [facility];  Service: Cardiovascular;  Laterality: N/A;   AMPUTATION Left 03/06/2021   Procedure: LEFT ABOVE KNEE AMPUTATION;  Surgeon: [name], MD;  Location: [facility];  Service: Vascular;  Laterality: Left;   ANTERIOR AND POSTERIOR REPAIR N/A 04/05/2016   Procedure: ANTERIOR (CYSTOCELE) AND POSTERIOR REPAIR (RECTOCELE);  Surgeon: [name], MD;  Location: [facility];  Service: Gynecology;  Laterality: N/A;   AORTIC VALVE REPLACEMENT N/A 05/04/2020   Procedure: AORTIC VALVE REPLACEMENT (AVR) USING INSPIRIS 23MM RESILIA AORTIC VALVE;  Surgeon: Rexene Alberts, MD;  Location: [place];  Service: Open Heart Surgery;  Laterality: N/A;   BREAST BIOPSY     BREAST LUMPECTOMY   1983   benign biopsy   BREAST LUMPECTOMY Right 12/29/2010   snbx, ER/PR +, Her2 -, 0/1 node pos.   CARPAL TUNNEL RELEASE Bilateral 9/12,8,12   CHOLECYSTECTOMY     COLONOSCOPY N/A 02/09/2013   Procedure: COLONOSCOPY;  Surgeon: [name], MD;  [facility];  Service: Endoscopy;  Laterality: N/A;   COLONOSCOPY     CORONARY ANGIOPLASTY     CORONARY ARTERY BYPASS GRAFT N/A 05/04/2020   Procedure: CORONARY ARTERY BYPASS GRAFTING (CABG), ON PUMP, TIMES TWO, USING LEFT INTERNAL MAMMARY ARTERY AND RIGHT ENDOSCOPICALLY HARVESTED GREATER SAPHENOUS VEIN;  Surgeon: Rexene Alberts, MD;  Location: [facility];  Service: Open Heart Surgery;  Laterality: N/A;   DILATION AND CURETTAGE OF UTERUS     ESOPHAGOGASTRODUODENOSCOPY N/A 02/09/2013   Procedure: ESOPHAGOGASTRODUODENOSCOPY (EGD);  Surgeon: [name], MD;  Location: [name];  Service: Endoscopy;  Laterality: N/A;   FOOT SPURS     HYSTEROSCOPY WITH D & C N/A 09/11/2012   Procedure: DILATATION AND CURETTAGE ;  Surgeon: [name], MD;  Location: [facility];  Service: Gynecology;  Laterality: N/A;   KNEE SURGERY  2007   LAPAROSCOPIC ASSISTED VAGINAL HYSTERECTOMY N/A 04/05/2016   Procedure: LAPAROSCOPIC ASSISTED VAGINAL HYSTERECTOMY possible BSO;  Surgeon: [name], MD;  Location: [facility];  Service: Gynecology;  Laterality: N/A;   LUMBAR LAMINECTOMY/DECOMPRESSION MICRODISCECTOMY N/A 10/20/2014   Procedure: MICRO LUMBER DECOMPRESSION L3-4 L4-5;  Surgeon: [name], MD;  [facility];  Service: Orthopedics;  Laterality: N/A;   LUMBAR LAMINECTOMY/DECOMPRESSION MICRODISCECTOMY Bilateral 10/13/2015   Procedure: MICRO LUMBAR DECOMPRESSION L5 - S1 AND REDO DECOMPRESSION L4 - L5 AND REMOVAL OF FACET CYST L4 - L5 2 LEVELS;  Surgeon: [name], MD;  [facility];  Service: Orthopedics;  Laterality: Bilateral;   PERIPHERAL VASCULAR ATHERECTOMY Right 06/12/2021   Procedure: PERIPHERAL VASCULAR ATHERECTOMY;  Surgeon: Waynetta Sandy, MD;  Location: [facility];  Service: Cardiovascular;  Laterality: Right;  SFA/POP/AT   PERIPHERAL VASCULAR INTERVENTION Right 04/29/2019   Procedure: PERIPHERAL VASCULAR INTERVENTION;  Surgeon: Marty Heck, MD;  Location: [facility];  Service: Cardiovascular;  Laterality: Right;   PERIPHERAL VASCULAR INTERVENTION Right 06/12/2021   Procedure: PERIPHERAL VASCULAR INTERVENTION;  Surgeon: Waynetta Sandy, MD;  Location: [facility];  Service: Cardiovascular;  Laterality: Right;  SFP/POPLITEAL/AT   PERIPHERAL VASCULAR INTERVENTION  03/07/2022   Procedure: PERIPHERAL VASCULAR INTERVENTION;  Surgeon: [name], MD;  Location: [facility];  Service: Cardiovascular;;   PORT-A-CATH REMOVAL  04/17/2011   Procedure: REMOVAL PORT-A-CATH;  Surgeon: [name], MD;  Location: [facility];  Service: General;  Laterality: Left;   PORTACATH PLACEMENT  02/07/2011  Procedure: INSERTION PORT-A-CATH;  Surgeon: [name], MD;  [facility];  Service: General;  Laterality: N/A;   RIGHT/LEFT HEART CATH AND CORONARY ANGIOGRAPHY N/A 03/17/2020   Procedure: RIGHT/LEFT HEART CATH AND CORONARY ANGIOGRAPHY;  Surgeon: Burnell Blanks, MD;  Location: [facility];  Service: Cardiovascular;  Laterality: N/A;   TEE WITHOUT CARDIOVERSION N/A 05/04/2020   Procedure: TRANSESOPHAGEAL ECHOCARDIOGRAM (TEE);  Surgeon: Rexene Alberts, MD;  Location: [facility];  Service: Open Heart Surgery;  Laterality: N/A;   UPPER GASTROINTESTINAL ENDOSCOPY     Patient Active Problem List   Diagnosis Date Noted   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[place]) 03/13/2021   Sinus tachycardia 03/11/2021   Elevated troponin 03/08/2021   Stroke-like symptoms 03/08/2021   Anemia of chronic disease 03/08/2021   Acute supraglottitis with epiglottitis 02/05/2021   Epiglottitis 02/05/2021   Reactive airway disease 01/20/2021   Diabetic peripheral  neuropathy ([place]) 12/19/2020   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) 04/07/2019   Scoliosis deformity of spine 02/03/2019   Osteoarthritis of hip 11/17/2018   Claudication ([place])    Chronic headaches 11/12/2017   Degeneration of lumbar intervertebral disc 06/13/2017   Hyperkalemia 06/03/2017   Stage 3b chronic kidney disease (CKD) ([place])    Mass of left adrenal gland ([place]) 04/19/2017   Spinal stenosis of lumbar region 10/20/2014   Hyperlipidemia 03/16/2013   Dense breasts 12/22/2012   Anxiety state 04/16/2012   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/16/2012   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   Osteoarthritis of knee 04/16/2012   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 female breast ([place]) 12/20/2010   History of breast cancer 12/05/2010   Depression 09/14/2008   GERD 09/14/2008   Type 2 diabetes mellitus with neurologic complication, with long-term current use of insulin ([place]) 05/20/2008   Essential hypertension 04/17/2007   Coronary atherosclerosis 04/17/2007   Hypothyroidism 04/03/2007    REFERRING DIAG: [id number] (ICD-10-CM) - Above knee amputation of left lower extremity   ONSET DATE: 02/02/2022 MD referral to PT   THERAPY DIAG:  Other abnormalities of gait and mobility  Unsteadiness on feet  Muscle weakness (generalized)  Stiffness of left hip, not elsewhere classified  Abnormal posture  Rationale for Evaluation and Treatment Rehabilitation  PERTINENT HISTORY: Left TFA, DM2, neuropathy, PVD, CKD st4, RLE stent, aortic stenosis, CAD, CABG X 2, HTN, OA, lumbar spinal stenosis, breast CA   PRECAUTIONS: Fall  SUBJECTIVE:  SUBJECTIVE STATEMENT:   She is having pain / discomfort as close to next infusion for her RA. She saw vascular MD who is checking her circulation.     PAIN:  Are you having pain? Yes: NPRS scale: today  0/10 and in last week 0/10, when doing stretch of hip flexors 6/10 Pain location: right lumbar  Pain description: achy Aggravating factors: walking without shoes with rollator Relieving factors: sit down   OBJECTIVE: (objective measures completed at initial evaluation unless otherwise dated) COGNITION: 02/07/2022 / Eval: Overall cognitive status: Within functional limits for tasks assessed   MUSCLE LENGTH: 02/07/2022 / Elizabeth SauerMarcello Moores test: Left -37 deg   POSTURE: 02/07/2022 / Eval:   rounded shoulders, forward head, increased lumbar lordosis, increased thoracic kyphosis, flexed trunk , and weight shift right   LOWER EXTREMITY ROM:   ROM P:passive  A:active Right Eval 02/07/22  Hip flexion    Hip extension Standing -27* excessive lordosis  Hip abduction    Hip adduction    Hip internal rotation    Hip external rotation    Knee flexion    Knee extension    Ankle dorsiflexion    Ankle plantarflexion    Ankle inversion    Ankle eversion     (Blank rows = not tested)   LOWER EXTREMITY MMT:   MMT Right eval Left eval  Hip flexion      Hip extension      Hip abduction      Hip adduction      Hip internal rotation      Hip external rotation      Knee flexion      Knee extension      Ankle dorsiflexion      Ankle plantarflexion      Ankle inversion      Ankle eversion      (Blank rows = not tested)   TRANSFERS: 02/07/2022 / Eval:  Sit to stand: SBA requires UE assist on armrest, prosthetic knee locked delayed in process impairing stabilization upon arising. Uses back of legs against chair & external support to stabilize 02/07/2022 / Eval:  Stand to sit: SBA  uses back of legs against chair for stability. External support required & armrests on chair.    GAIT: 02/07/2022 / Eval: Gait pattern: step to pattern, decreased arm swing- Left, decreased step length- Right, decreased stance time- Left, circumduction- Left, Left hip hike, antalgic, lateral hip instability, trunk flexed, and abducted- Left Distance walked: 100' Assistive device utilized: Lobbyist with HHA (required) and Walker - 4 wheeled Level of assistance: Mod A with SBQC / HHA and SBA / verbal cues for rollator Gait velocity: 1.22 ft/sec with rollator walker   FUNCTIONAL TESTs:  Eval (02/07/2022): Timed up and go (TUG): 28.97 sec with rollator Berg Balance Scale: 21/56  CURRENT PROSTHETIC WEAR ASSESSMENT:  02/07/2022 / Eval: Patient is independent with: skin check, residual limb care, and prosthetic cleaning Patient is dependent with: correct ply sock adjustment, proper wear schedule/adjustment, and proper weight-bearing schedule/adjustment Donning prosthesis: Modified independence per pt report Doffing prosthesis: Modified independence per pt report Prosthetic wear tolerance: ~8 hours/day, 7 days/week (~60-70% of awake hours) Prosthetic weight bearing tolerance: 8 minutes with no c/o limb pain Residual limb condition: pt reports no issues Prosthetic description: silicon liner with suction ring suspension, ischial containment socket with flexible inner socket, non-hydraulic knee,        TODAY'S TREATMENT:  DATE:  04/09/2022: Prosthetic Training with Transfemoral prosthesis: PT reviewed walking program of short high frequency over awake hours, long walk max tolerable & medium distance 4+/day. Pt verbalized understanding.  PT verbally reviewed sit to stand & stand to sit technique. Pt return demo understanding.  Pt able to stand 2 min without UE  support safely Pt amb 150' X 2 with [facility] with close supervision with touch to stabilize 6-8X per 150'. This is improvement from [facility] with release 3-5 steps.  PT demo & verbal cues on loading RW in car if she is going to drive short distances from her home. Pt verbalized understanding.    04/02/2022: Prosthetic Training with Transfemoral prosthesis: Pt amb 75' X 4 with SBQC & HHA min to modA.  Progressed to no HHA up to 3 consecutive prosthetic steps.  PT recommended only on level, smooth surfaces straight path with her husband.  Initiate with HHA for 2+ steps then pt to let go of husband's hand which he continues to hold just in front of trunk on left side. Pt verbalized understanding. PT demo & verbal cues on negotiating around obstacles  / turning. Pt neg single cone 5' apart, then double cones for increased turn and then around table for 90* turns. She required modA HHA with [facility] and one LOB backwards with maxA to prevent fall.  Standing posture with back to door frame reaching over head single UEs & BUEs for 2 deep breath hold 2 reps ea.  PT recommending when exiting bathroom 3+ times / day. Pt verbalized understanding.     03/26/2022: Prosthetic Training with Transfemoral prosthesis: Sit to / from stand using UEs on seat of chair & no touch to stabilize.  5 reps 2 sets. Pt amb with counter support LUE & SBQC RUE forward hovering over counter & backward touch counter with supervision.  Verbal & demo cues on technique. Pt amb 75' X 3 with SBQC & HHA min to modA.  Progressed to no HHA up to 4 consecutive steps.  PT demo to husband how to provide assistance for safety with less support. He verbalized understanding.  PT educated pt in progressing walking endurance with max tolerable until RLE has claudication pain using her rollator walker 1-2 reps daily. Pt verbalized understanding.   Neuromuscular Re-education for balance In //bars with feet hip width apart with pelvic wt shift midline back of  Left hand to rail for stability & maintain weight on prosthesis. Mirror for visual feed back, verbal & tactile cues.   On floor eyes open head movements right/left, up/down & diagonals 8 reps ea.  On floor eyes closed head movements right/left, up/down & diagonals 8 reps ea.   On foam eyes open head movements right/left, up/down & diagonals 8 reps ea.     HOME EXERCISE PROGRAM: Access Code: [email or web address] URL: [email or web address] Date: 03/12/2022 Prepared by: Jamey Reas  Exercises - sit to stand to sit with counter  - 2-3 x daily - 7 x weekly - 1 sets - 10 reps - 5 seconds hold - Modified Thomas Stretch  - 2-3 x daily - 7 x weekly - 1 sets - 2-3 reps - 20-30 seconds hold - wide stance head motions eyes open  - 1 x daily - 4-7 x weekly - 1 sets - 10 reps - 2 seconds hold - Feet Apart with Eyes Closed with Head Motions  - 1 x daily - 4-7 x weekly - 1 sets - 10 reps - 2 seconds hold - Wide  stance on Foam Pad head movements  - 1 x daily - 4-7 x weekly - 1 sets - 10 reps - 2 seconds hold - Ankle Alphabet in Elevation  - 1 x daily - 7 x weekly - 3 sets - 1 reps - 10-15 minutes hold  Stand in front of sink with one chair back on each side of you and 3rd chair behind you:  Your prosthetic toes should be back ~1" to your right toes.  Both feet facing forward about hip width apart.   Turn in circles using cane.  When going to left lead with left leg.  When going to right, lead with right leg.  Reach both hands over head and return both hands to counter.  Follow your hands with your eyes.  Keep weight on both legs.  Turn your upper body to place item with both hands on back of chair to each side. Stop at counter between directions.  Follow your hands with your eyes.  Keep weight on both legs. Lean to each side placing item in chair bottom with one hand (use right hand going to right & left hand going to left).  Follow your hand with your eyes.  Turn around you are facing chair that was  behind you.  Make sure that your back side does not touch counter when you bend forward.  Lean forward to place an item in chair seat then stand back up straight.   Stand up & sit down using chair seat to push up. Goal is to not touch sink unless off balance.   standing Up sit at edge of chair with right foot near chair, left foot /prosthesis with heel touching. push forward to SMELL flower,  keep prosthetic foot in contact with floor.  then straight up back.  Sitting down: stand with right foot near chair & left prosthetic foot forward a little bow to SMELL to flower unlocking prosthetic knee reach with LEFT hand first to sit.    ASSESSMENT: CLINICAL IMPRESSION: Patient met all STGs for this 30-day period.  She improved gait with SBQC to intermittent touch instead of intermittent release of HHA.   Pt continues to benefit from skilled PT.    OBJECTIVE IMPAIRMENTS: Abnormal gait, decreased activity tolerance, decreased balance, decreased endurance, decreased knowledge of condition, decreased knowledge of use of DME, decreased mobility, difficulty walking, decreased ROM, decreased strength, impaired flexibility, postural dysfunction, prosthetic dependency , and pain.    ACTIVITY LIMITATIONS: carrying, lifting, bending, sitting, standing, sleeping, stairs, transfers, and locomotion level   PARTICIPATION LIMITATIONS: meal prep, cleaning, laundry, community activity, and yard work   PERSONAL FACTORS: Age, Fitness, Past/current experiences, Time since onset of injury/illness/exacerbation, and 3+ comorbidities: see PMH  are also affecting patient's functional outcome.    REHAB POTENTIAL: Good   CLINICAL DECISION MAKING: Evolving/moderate complexity   EVALUATION COMPLEXITY: Moderate     GOALS: Goals reviewed with patient? Yes   SHORT TERM GOALS: Target date: 04/09/2022   Patient verbalizes & demonstrates understanding of updated HEP. Baseline: SEE OBJECTIVE DATA Goal status: MET  04/09/2022 2.  Patient able to balance 2 min without UE support with supervision. Baseline: SEE OBJECTIVE DATA Goal status: MET 04/09/2022   3.  Patient ambulates 60' with [facility] & prosthesis with minA. Baseline: SEE OBJECTIVE DATA Goal status: MET 04/09/2022     LONG TERM GOALS: Target date: 05/08/2022   Patient demonstrates & verbalized understanding of prosthetic care to enable safe utilization of prosthesis. Baseline: SEE OBJECTIVE DATA  Goal status:  Ongoing 04/02/2022   Patient tolerates prosthesis wear >90% of awake hours without skin or limb pain issues. Baseline: SEE OBJECTIVE DATA Goal status: Ongoing 04/02/2022   Berg Balance >/= 36/56 to indicate lower fall risk Baseline: SEE OBJECTIVE DATA Goal status: Ongoing 04/02/2022   Patient ambulates >200' with prosthesis & SBQC modified independently Baseline: SEE OBJECTIVE DATA Goal status:   Ongoing 04/02/2022   Patient negotiates ramps, curbs & stairs with single rail with prosthesis & SBQC with family safely.  Baseline: SEE OBJECTIVE DATA Goal status: Ongoing 04/02/2022   Patient verbalizes & demonstrates understanding of ongoing HEP Baseline: SEE OBJECTIVE DATA Goal status: Ongoing 04/02/2022   7.  Patient reports low back pain increases </= 2 increments with above activities. Baseline: SEE OBJECTIVE DATA Goal status: Ongoing 04/02/2022   PLAN:   PT FREQUENCY: 1x/week   PT DURATION:  90 days    PLANNED INTERVENTIONS: Therapeutic exercises, Therapeutic activity, Neuromuscular re-education, Balance training, Gait training, Patient/Family education, Self Care, Stair training, Vestibular training, Prosthetic training, DME instructions, Manual therapy, Re-evaluation, and Physical performance testng.   PLAN FOR NEXT SESSION:   work towards [place],  continue to  progress prosthetic gait with [facility] & balance with instructions for home.   Jamey Reas, PT, DPT 04/09/2022, 11:16 AM

## 2022-04-16 ENCOUNTER — Ambulatory Visit (INDEPENDENT_AMBULATORY_CARE_PROVIDER_SITE_OTHER): Payer: Medicare Other | Admitting: Physical Therapy

## 2022-04-16 ENCOUNTER — Encounter: Payer: Self-pay | Admitting: Physical Therapy

## 2022-04-16 DIAGNOSIS — R2681 Unsteadiness on feet: Secondary | ICD-10-CM

## 2022-04-16 DIAGNOSIS — R2689 Other abnormalities of gait and mobility: Secondary | ICD-10-CM | POA: Diagnosis not present

## 2022-04-16 DIAGNOSIS — M25652 Stiffness of left hip, not elsewhere classified: Secondary | ICD-10-CM

## 2022-04-16 DIAGNOSIS — M6281 Muscle weakness (generalized): Secondary | ICD-10-CM

## 2022-04-16 DIAGNOSIS — M5459 Other low back pain: Secondary | ICD-10-CM

## 2022-04-16 DIAGNOSIS — R293 Abnormal posture: Secondary | ICD-10-CM

## 2022-04-16 DIAGNOSIS — Z9181 History of falling: Secondary | ICD-10-CM

## 2022-04-16 IMAGING — DX DG CHEST 1V PORT
1 series · 1 of 1 positions shown · non-contrast
Comparison: May 02, 2020.

CLINICAL DATA: Status post coronary bypass graft.

EXAM:
PORTABLE CHEST 1 VIEW

[chest ap]
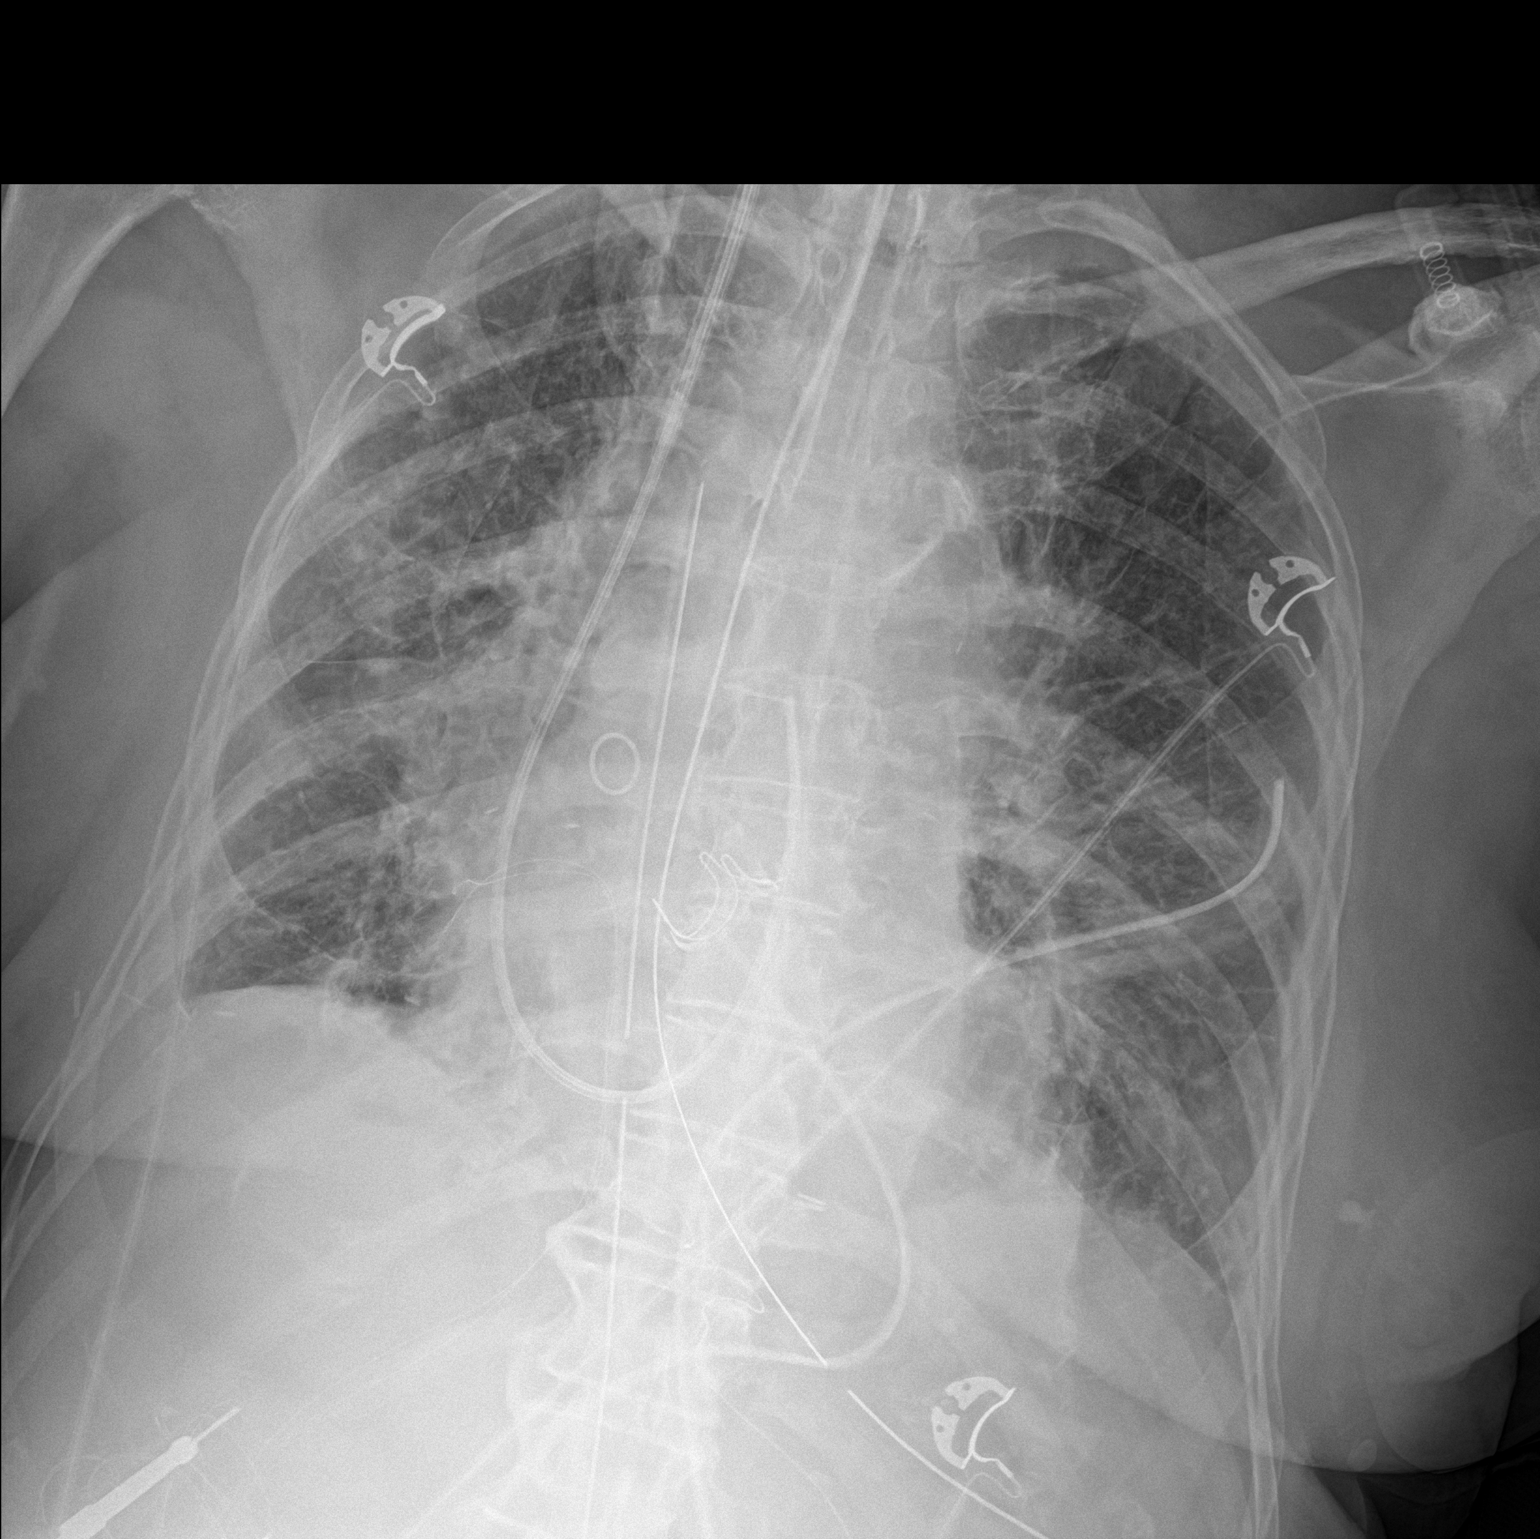

[1 of 1 positions shown; findings below may reference images not displayed]

FINDINGS: Stable cardiomediastinal silhouette. Endotracheal and nasogastric
tubes are in good position. Right internal jugular Swan-Ganz
catheter is noted with tip in expected position of main pulmonary
artery. Status post aortic valve repair. Left-sided chest tube is
noted without pneumothorax. Mild bilateral atelectasis is noted.
Bony thorax is unremarkable.
IMPRESSION: Endotracheal and nasogastric tubes are in grossly good position.
Left-sided chest is noted without pneumothorax.

## 2022-04-16 NOTE — Therapy (Signed)
OUTPATIENT PHYSICAL THERAPY TREATMENT NOTE & PROGRESS NOTE   Patient Name: [name] MRN: [id number] DOB:[birth year], 82 y.o., female Today's Date: 04/16/2022  PCP: [name], MD  REFERRING PROVIDER: [name], MD   Progress Note Reporting Period 02/07/2022 to 04/16/2022  See note below for Objective Data and Assessment of Progress/Goals.      END OF SESSION:   PT End of Session - 04/16/22 0918     Visit Number 10    Number of Visits 14    Date for PT Re-Evaluation 05/08/22    Authorization Type [employer] Medicare    Authorization Time Period no co-pay, no visit limit    Progress Note Due on Visit 10    PT Start Time 0930    PT Stop Time 1008    PT Time Calculation (min) 38 min    Equipment Utilized During Treatment Gait belt    Activity Tolerance Patient tolerated treatment well    Behavior During Therapy WFL for tasks assessed/performed                   Past Medical History:  Diagnosis Date   Allergy    Anemia    childhood   Anxiety    Aortic stenosis    Arterial stenosis (HCC)    mesenteric   Arthritis    Breast cancer ([place]) 12/05/10   R breast, inv mammary, in situ,ER/PR +,HER2 -   Cancer ([place])    Carcinoma of breast treated with adjuvant chemotherapy ([place])    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[place])    Claudication ([place])    Coronary artery disease    stent 2007   Depression    Diabetes mellitus    Difficulty sleeping    Diverticulosis 12/10/03   Elevated cholesterol    Generalized weakness    GERD (gastroesophageal reflux disease)    Heart murmur    Hepatitis    as an infant   HSV-1 (herpes simplex virus 1) infection    Hyperlipidemia    Hypertension    Hypothyroidism    Osteoarthritis    PAD (peripheral artery disease) ([place])    Personal history of chemotherapy    Personal history of radiation therapy    Pneumonia    2014 september and March 2022   Rheumatoid arthritis ([place])    S/P aortic valve replacement with  bioprosthetic valve 05/04/2020   Edwards Inspiris Resilia stented bovine pericardial tissue valve, size 23 mm   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   LIMA to D3, SVG to PDA, EVH via right thigh   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   Dr [name]   Spinal stenosis    Thyroid disease    Past Surgical History:  Procedure Laterality Date   ABDOMINAL AORTOGRAM W/LOWER EXTREMITY Bilateral 04/29/2019   Procedure: ABDOMINAL AORTOGRAM W/LOWER EXTREMITY;  Surgeon: Marty Heck, MD;  Location: [facility];  Service: Cardiovascular;  Laterality: Bilateral;   ABDOMINAL AORTOGRAM W/LOWER EXTREMITY Left 07/01/2019   Procedure: ABDOMINAL AORTOGRAM W/ Left LOWER EXTREMITY [place];  Surgeon: Marty Heck, MD;  Location: [facility];  Service: Cardiovascular;  Laterality: Left;   ABDOMINAL AORTOGRAM W/LOWER EXTREMITY Left 03/03/2021   Procedure: ABDOMINAL AORTOGRAM W/LOWER EXTREMITY;  Surgeon: Cherre Robins, MD;  Location: [facility];  Service: Cardiovascular;  Laterality: Left;   ABDOMINAL AORTOGRAM W/LOWER EXTREMITY Right 06/12/2021   Procedure: ABDOMINAL AORTOGRAM W/LOWER EXTREMITY;  Surgeon: Servando Snare  Harrell Gave, MD;  Location: [facility];  Service: Cardiovascular;  Laterality: Right;  LOWER LEG   ABDOMINAL AORTOGRAM W/LOWER EXTREMITY N/A 03/07/2022   Procedure: ABDOMINAL AORTOGRAM W/LOWER EXTREMITY;  Surgeon: [name], MD;  Location: [facility];  Service: Cardiovascular;  Laterality: N/A;   AMPUTATION Left 03/06/2021   Procedure: LEFT ABOVE KNEE AMPUTATION;  Surgeon: [name], MD;  Location: [facility];  Service: Vascular;  Laterality: Left;   ANTERIOR AND POSTERIOR REPAIR N/A 04/05/2016   Procedure: ANTERIOR (CYSTOCELE) AND POSTERIOR REPAIR (RECTOCELE);  Surgeon: [name], MD;  Location: [facility];  Service: Gynecology;  Laterality: N/A;   AORTIC VALVE REPLACEMENT N/A 05/04/2020   Procedure: AORTIC VALVE REPLACEMENT (AVR) USING INSPIRIS 23MM RESILIA  AORTIC VALVE;  Surgeon: Rexene Alberts, MD;  Location: [facility];  Service: Open Heart Surgery;  Laterality: N/A;   BREAST BIOPSY     BREAST LUMPECTOMY  1983   benign biopsy   BREAST LUMPECTOMY Right 12/29/2010   snbx, ER/PR +, Her2 -, 0/1 node pos.   CARPAL TUNNEL RELEASE Bilateral 9/12,8,12   CHOLECYSTECTOMY     COLONOSCOPY N/A 02/09/2013   Procedure: COLONOSCOPY;  Surgeon: [name], MD;  [facility];  Service: Endoscopy;  Laterality: N/A;   COLONOSCOPY     CORONARY ANGIOPLASTY     CORONARY ARTERY BYPASS GRAFT N/A 05/04/2020   Procedure: CORONARY ARTERY BYPASS GRAFTING (CABG), ON PUMP, TIMES TWO, USING LEFT INTERNAL MAMMARY ARTERY AND RIGHT ENDOSCOPICALLY HARVESTED GREATER SAPHENOUS VEIN;  Surgeon: Rexene Alberts, MD;  Location: [facility];  Service: Open Heart Surgery;  Laterality: N/A;   DILATION AND CURETTAGE OF UTERUS     ESOPHAGOGASTRODUODENOSCOPY N/A 02/09/2013   Procedure: ESOPHAGOGASTRODUODENOSCOPY (EGD);  Surgeon: [name], MD;  Location: [name];  Service: Endoscopy;  Laterality: N/A;   FOOT SPURS     HYSTEROSCOPY WITH D & C N/A 09/11/2012   Procedure: DILATATION AND CURETTAGE ;  Surgeon: [name], MD;  Location: [facility];  Service: Gynecology;  Laterality: N/A;   KNEE SURGERY  2007   LAPAROSCOPIC ASSISTED VAGINAL HYSTERECTOMY N/A 04/05/2016   Procedure: LAPAROSCOPIC ASSISTED VAGINAL HYSTERECTOMY possible BSO;  Surgeon: [name], MD;  Location: [facility];  Service: Gynecology;  Laterality: N/A;   LUMBAR LAMINECTOMY/DECOMPRESSION MICRODISCECTOMY N/A 10/20/2014   Procedure: MICRO LUMBER DECOMPRESSION L3-4 L4-5;  Surgeon: [name], MD;  [facility];  Service: Orthopedics;  Laterality: N/A;   LUMBAR LAMINECTOMY/DECOMPRESSION MICRODISCECTOMY Bilateral 10/13/2015   Procedure: MICRO LUMBAR DECOMPRESSION L5 - S1 AND REDO DECOMPRESSION L4 - L5 AND REMOVAL OF FACET CYST L4 - L5 2 LEVELS;  Surgeon: [name], MD;  [facility];  Service: Orthopedics;   Laterality: Bilateral;   PERIPHERAL VASCULAR ATHERECTOMY Right 06/12/2021   Procedure: PERIPHERAL VASCULAR ATHERECTOMY;  Surgeon: Waynetta Sandy, MD;  Location: [facility];  Service: Cardiovascular;  Laterality: Right;  SFA/POP/AT   PERIPHERAL VASCULAR INTERVENTION Right 04/29/2019   Procedure: PERIPHERAL VASCULAR INTERVENTION;  Surgeon: Marty Heck, MD;  Location: [facility];  Service: Cardiovascular;  Laterality: Right;   PERIPHERAL VASCULAR INTERVENTION Right 06/12/2021   Procedure: PERIPHERAL VASCULAR INTERVENTION;  Surgeon: Waynetta Sandy, MD;  Location: [facility];  Service: Cardiovascular;  Laterality: Right;  SFP/POPLITEAL/AT   PERIPHERAL VASCULAR INTERVENTION  03/07/2022   Procedure: PERIPHERAL VASCULAR INTERVENTION;  Surgeon: [name], MD;  Location: [facility];  Service: Cardiovascular;;   PORT-A-CATH REMOVAL  04/17/2011  Procedure: REMOVAL PORT-A-CATH;  Surgeon: [name], MD;  Location: [facility];  Service: General;  Laterality: Left;   PORTACATH PLACEMENT  02/07/2011   Procedure: INSERTION PORT-A-CATH;  Surgeon: [name], MD;  [facility];  Service: General;  Laterality: N/A;   RIGHT/LEFT HEART CATH AND CORONARY ANGIOGRAPHY N/A 03/17/2020   Procedure: RIGHT/LEFT HEART CATH AND CORONARY ANGIOGRAPHY;  Surgeon: Burnell Blanks, MD;  Location: [facility];  Service: Cardiovascular;  Laterality: N/A;   TEE WITHOUT CARDIOVERSION N/A 05/04/2020   Procedure: TRANSESOPHAGEAL ECHOCARDIOGRAM (TEE);  Surgeon: Rexene Alberts, MD;  Location: [facility];  Service: Open Heart Surgery;  Laterality: N/A;   UPPER GASTROINTESTINAL ENDOSCOPY     Patient Active Problem List   Diagnosis Date Noted   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[place]) 03/13/2021   Sinus tachycardia 03/11/2021   Elevated troponin 03/08/2021   Stroke-like symptoms 03/08/2021   Anemia of chronic disease  03/08/2021   Acute supraglottitis with epiglottitis 02/05/2021   Epiglottitis 02/05/2021   Reactive airway disease 01/20/2021   Diabetic peripheral neuropathy ([place]) 12/19/2020   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   Primary insomnia 11/02/2020   Irritation of eyelid 07/26/2020   S/P aortic valve replacement with bioprosthetic valve 05/04/2020   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   Peripheral artery disease (HCC)    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[place]) 04/07/2019   Scoliosis deformity of spine 02/03/2019   Osteoarthritis of hip 11/17/2018   Claudication ([place])    Chronic headaches 11/12/2017   Degeneration of lumbar intervertebral disc 06/13/2017   Hyperkalemia 06/03/2017   Stage 3b chronic kidney disease (CKD) ([place])    Mass of left adrenal gland ([place]) 04/19/2017   Spinal stenosis of lumbar region 10/20/2014   Hyperlipidemia 03/16/2013   Dense breasts 12/22/2012   Anxiety state 04/16/2012   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/16/2012   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   Osteoarthritis of knee 04/16/2012   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 female breast ([place]) 12/20/2010   History of breast cancer 12/05/2010   Depression 09/14/2008   GERD 09/14/2008   Type 2 diabetes mellitus with neurologic complication, with long-term current use of insulin ([place]) 05/20/2008   Essential hypertension 04/17/2007   Coronary atherosclerosis 04/17/2007   Hypothyroidism 04/03/2007    REFERRING DIAG: [id number] (ICD-10-CM) - Above knee amputation of left lower extremity   ONSET DATE: 02/02/2022 MD referral to PT   THERAPY DIAG:  Other abnormalities of gait and mobility  Unsteadiness on feet  Muscle weakness (generalized)  Stiffness of left hip, not elsewhere classified  Abnormal posture  History of falling  Other low back pain  Rationale for Evaluation and Treatment Rehabilitation  PERTINENT HISTORY: Left TFA, DM2, neuropathy, PVD,  CKD st4, RLE stent, aortic stenosis, CAD, CABG X 2, HTN, OA, lumbar spinal stenosis, breast CA   PRECAUTIONS: Fall  SUBJECTIVE:  SUBJECTIVE STATEMENT:  She has been walking with cane. No falls or issues.  PAIN:  Are you having pain? Yes: NPRS scale: today  0/10 and in last week 0/10, when doing stretch of hip flexors 6/10 Pain location: right lumbar  Pain description: achy Aggravating factors: walking without shoes with rollator Relieving factors: sit down   OBJECTIVE: (objective measures completed at initial evaluation unless otherwise dated) COGNITION: 02/07/2022 / Eval: Overall cognitive status: Within functional limits for tasks assessed   MUSCLE LENGTH: 02/07/2022 / Elizabeth SauerMarcello Moores test: Left -37 deg   POSTURE: 02/07/2022 / Eval:   rounded shoulders, forward head, increased lumbar lordosis, increased thoracic kyphosis, flexed trunk , and weight shift right   LOWER EXTREMITY ROM:   ROM P:passive  A:active Right Eval 02/07/22  Hip flexion    Hip extension Standing -27* excessive lordosis  Hip abduction    Hip adduction    Hip internal rotation    Hip external rotation    Knee flexion    Knee extension    Ankle dorsiflexion    Ankle plantarflexion    Ankle inversion    Ankle eversion     (Blank rows = not tested)   LOWER EXTREMITY MMT:   MMT Right eval Left eval  Hip flexion      Hip extension      Hip abduction      Hip adduction      Hip internal rotation      Hip external rotation      Knee flexion      Knee extension      Ankle dorsiflexion      Ankle plantarflexion      Ankle inversion      Ankle eversion      (Blank rows = not tested)   TRANSFERS: 02/07/2022 / Eval:  Sit to stand: SBA requires UE assist on armrest, prosthetic knee locked delayed in process impairing  stabilization upon arising. Uses back of legs against chair & external support to stabilize 02/07/2022 / Eval:  Stand to sit: SBA uses back of legs against chair for stability. External support required & armrests on chair.    GAIT: 02/07/2022 / Eval: Gait pattern: step to pattern, decreased arm swing- Left, decreased step length- Right, decreased stance time- Left, circumduction- Left, Left hip hike, antalgic, lateral hip instability, trunk flexed, and abducted- Left Distance walked: 100' Assistive device utilized: Lobbyist with HHA (required) and Walker - 4 wheeled Level of assistance: Mod A with SBQC / HHA and SBA / verbal cues for rollator Gait velocity: 1.22 ft/sec with rollator walker   FUNCTIONAL TESTs:  Eval (02/07/2022): Timed up and go (TUG): 28.97 sec with rollator Berg Balance Scale: 21/56  CURRENT PROSTHETIC WEAR ASSESSMENT:  02/07/2022 / Eval: Patient is independent with: skin check, residual limb care, and prosthetic cleaning Patient is dependent with: correct ply sock adjustment, proper wear schedule/adjustment, and proper weight-bearing schedule/adjustment Donning prosthesis: Modified independence per pt report Doffing prosthesis: Modified independence per pt report Prosthetic wear tolerance: ~8 hours/day, 7 days/week (~60-70% of awake hours) Prosthetic weight bearing tolerance: 8 minutes with no c/o limb pain Residual limb condition: pt reports no issues Prosthetic description: silicon liner with suction ring suspension, ischial containment socket with flexible inner socket, non-hydraulic knee,        TODAY'S TREATMENT:  DATE:  04/16/2022: Prosthetic Training with Transfemoral prosthesis: 5 times sit to stand pushing off 18" chair seat with RUE and no touch to stabilize Pt amb 175' with intermittent touch LUE on PTs hand 4 times in first  150' then RLE fatigue required HHA for last 25'.  Pt amb 100' X 2 with intermittent touch with CGA. Pt neg ramp & curb with SBQC with HHA / min-modA with cues on technique. 2 reps with improved understanding.  PT demo & verbal cues on picking up item from floor. Chair behind & in front for safety. Pt able to pick up hand sanitizer 3 times with PT supervision & cues. Pt verbalized understanding including set-up to practice at home.   04/09/2022: Prosthetic Training with Transfemoral prosthesis: PT reviewed walking program of short high frequency over awake hours, long walk max tolerable & medium distance 4+/day. Pt verbalized understanding.  PT verbally reviewed sit to stand & stand to sit technique. Pt return demo understanding.  Pt able to stand 2 min without UE support safely Pt amb 150' X 2 with [facility] with close supervision with touch to stabilize 6-8X per 150'. This is improvement from [facility] with release 3-5 steps.  PT demo & verbal cues on loading RW in car if she is going to drive short distances from her home. Pt verbalized understanding.    04/02/2022: Prosthetic Training with Transfemoral prosthesis: Pt amb 75' X 4 with SBQC & HHA min to modA.  Progressed to no HHA up to 3 consecutive prosthetic steps.  PT recommended only on level, smooth surfaces straight path with her husband.  Initiate with HHA for 2+ steps then pt to let go of husband's hand which he continues to hold just in front of trunk on left side. Pt verbalized understanding. PT demo & verbal cues on negotiating around obstacles  / turning. Pt neg single cone 5' apart, then double cones for increased turn and then around table for 90* turns. She required modA HHA with [facility] and one LOB backwards with maxA to prevent fall.  Standing posture with back to door frame reaching over head single UEs & BUEs for 2 deep breath hold 2 reps ea.  PT recommending when exiting bathroom 3+ times / day. Pt verbalized understanding.      HOME  EXERCISE PROGRAM: Access Code: [email or web address] URL: [email or web address] Date: 03/12/2022 Prepared by: Jamey Reas  Exercises - sit to stand to sit with counter  - 2-3 x daily - 7 x weekly - 1 sets - 10 reps - 5 seconds hold - Modified Thomas Stretch  - 2-3 x daily - 7 x weekly - 1 sets - 2-3 reps - 20-30 seconds hold - wide stance head motions eyes open  - 1 x daily - 4-7 x weekly - 1 sets - 10 reps - 2 seconds hold - Feet Apart with Eyes Closed with Head Motions  - 1 x daily - 4-7 x weekly - 1 sets - 10 reps - 2 seconds hold - Wide stance on Foam Pad head movements  - 1 x daily - 4-7 x weekly - 1 sets - 10 reps - 2 seconds hold - Ankle Alphabet in Elevation  - 1 x daily - 7 x weekly - 3 sets - 1 reps - 10-15 minutes hold  Stand in front of sink with one chair back on each side of you and 3rd chair behind you:  Your prosthetic toes should be back ~1" to your right toes.  Both feet  facing forward about hip width apart.   Turn in circles using cane.  When going to left lead with left leg.  When going to right, lead with right leg.  Reach both hands over head and return both hands to counter.  Follow your hands with your eyes.  Keep weight on both legs.  Turn your upper body to place item with both hands on back of chair to each side. Stop at counter between directions.  Follow your hands with your eyes.  Keep weight on both legs. Lean to each side placing item in chair bottom with one hand (use right hand going to right & left hand going to left).  Follow your hand with your eyes.  Turn around you are facing chair that was behind you.  Make sure that your back side does not touch counter when you bend forward.  Lean forward to place an item in chair seat then stand back up straight.   Stand up & sit down using chair seat to push up. Goal is to not touch sink unless off balance.   standing Up sit at edge of chair with right foot near chair, left foot /prosthesis with heel  touching. push forward to SMELL flower,  keep prosthetic foot in contact with floor.  then straight up back.  Sitting down: stand with right foot near chair & left prosthetic foot forward a little bow to SMELL to flower unlocking prosthetic knee reach with LEFT hand first to sit.    ASSESSMENT: CLINICAL IMPRESSION: Patient has made significant progress in 10 PT visits. PT continues to instruct in activities to perform at home including safe set-up which she understands and practices enabling progress.  Pt continues to benefit from skilled PT. PT does anticipate discharge at end of POC 05/08/2022.   OBJECTIVE IMPAIRMENTS: Abnormal gait, decreased activity tolerance, decreased balance, decreased endurance, decreased knowledge of condition, decreased knowledge of use of DME, decreased mobility, difficulty walking, decreased ROM, decreased strength, impaired flexibility, postural dysfunction, prosthetic dependency , and pain.    ACTIVITY LIMITATIONS: carrying, lifting, bending, sitting, standing, sleeping, stairs, transfers, and locomotion level   PARTICIPATION LIMITATIONS: meal prep, cleaning, laundry, community activity, and yard work   PERSONAL FACTORS: Age, Fitness, Past/current experiences, Time since onset of injury/illness/exacerbation, and 3+ comorbidities: see PMH  are also affecting patient's functional outcome.    REHAB POTENTIAL: Good   CLINICAL DECISION MAKING: Evolving/moderate complexity   EVALUATION COMPLEXITY: Moderate     GOALS: Goals reviewed with patient? Yes   SHORT TERM GOALS: Target date: 04/09/2022   Patient verbalizes & demonstrates understanding of updated HEP. Baseline: SEE OBJECTIVE DATA Goal status: MET 04/09/2022 2.  Patient able to balance 2 min without UE support with supervision. Baseline: SEE OBJECTIVE DATA Goal status: MET 04/09/2022   3.  Patient ambulates 18' with [facility] & prosthesis with minA. Baseline: SEE OBJECTIVE DATA Goal status: MET  04/09/2022     LONG TERM GOALS: Target date: 05/08/2022   Patient demonstrates & verbalized understanding of prosthetic care to enable safe utilization of prosthesis. Baseline: SEE OBJECTIVE DATA Goal status:  Ongoing 04/02/2022   Patient tolerates prosthesis wear >90% of awake hours without skin or limb pain issues. Baseline: SEE OBJECTIVE DATA Goal status: Ongoing 04/02/2022   Berg Balance >/= 36/56 to indicate lower fall risk Baseline: SEE OBJECTIVE DATA Goal status: Ongoing 04/02/2022   Patient ambulates >200' with prosthesis & SBQC modified independently Baseline: SEE OBJECTIVE DATA Goal status:   Ongoing 04/02/2022  Patient negotiates ramps, curbs & stairs with single rail with prosthesis & SBQC with family safely.  Baseline: SEE OBJECTIVE DATA Goal status: Ongoing 04/02/2022   Patient verbalizes & demonstrates understanding of ongoing HEP Baseline: SEE OBJECTIVE DATA Goal status: Ongoing 04/02/2022   7.  Patient reports low back pain increases </= 2 increments with above activities. Baseline: SEE OBJECTIVE DATA Goal status: Ongoing 04/02/2022   PLAN:   PT FREQUENCY: 1x/week   PT DURATION:  90 days    PLANNED INTERVENTIONS: Therapeutic exercises, Therapeutic activity, Neuromuscular re-education, Balance training, Gait training, Patient/Family education, Self Care, Stair training, Vestibular training, Prosthetic training, DME instructions, Manual therapy, Re-evaluation, and Physical performance testng.   PLAN FOR NEXT SESSION:   continue work towards [place],  continue to  progress prosthetic gait with [facility] & balance with instructions for home.   Jamey Reas, PT, DPT 04/16/2022, 10:11 AM

## 2022-04-17 IMAGING — DX DG CHEST 1V PORT
1 series · 1 of 1 positions shown · non-contrast
Comparison: 05/04/2020

CLINICAL DATA: Respiratory distress

EXAM:
PORTABLE CHEST 1 VIEW

[chest ap]
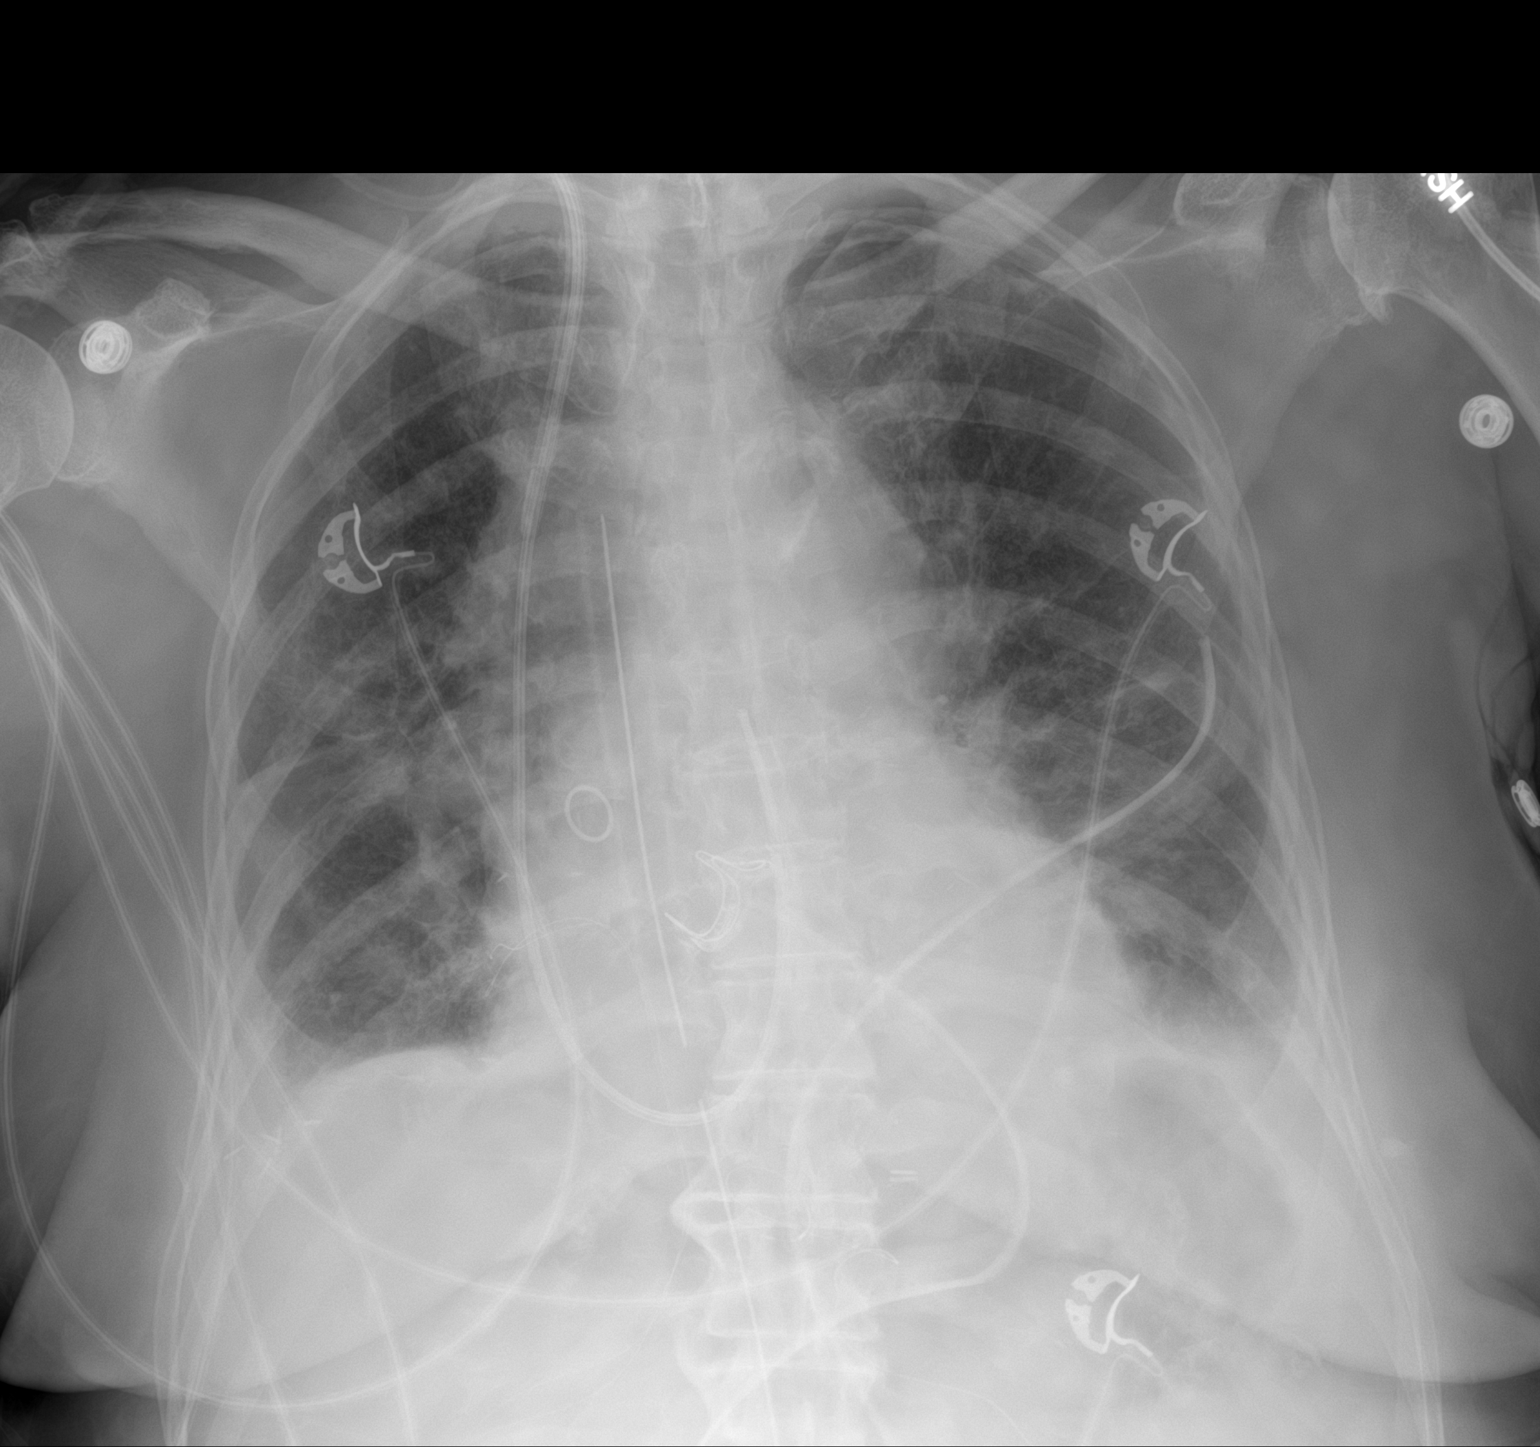

[1 of 1 positions shown; findings below may reference images not displayed]

FINDINGS: Interval extubation. Pulmonary insufflation is preserved. Right
internal jugular Swan-Ganz catheter with its tip within the main
pulmonary artery, bilateral chest tubes, and mediastinal drain are
unchanged. Small left apical pneumothorax is present, new since
prior examination. Tiny bilateral pleural effusions are present.
Trace bilateral perihilar interstitial pulmonary edema is again
seen. Aortic valve replacement and coronary artery bypass grafting
has been performed. Cardiac size within normal limits.
IMPRESSION: Interval extubation with preservation of pulmonary volumes.

Interval development of small left apical pneumothorax. Left chest
tube in place.

Right chest tube in place.  No pneumothorax on the right.

Are stable mild perihilar interstitial pulmonary edema and tiny
bilateral pleural effusions.

These results will be called to the ordering clinician or
representative by the Radiologist Assistant, and communication
documented in the PACS or [REDACTED].

## 2022-04-23 ENCOUNTER — Ambulatory Visit (INDEPENDENT_AMBULATORY_CARE_PROVIDER_SITE_OTHER): Payer: Medicare Other | Admitting: Physical Therapy

## 2022-04-23 ENCOUNTER — Encounter: Payer: Self-pay | Admitting: Physical Therapy

## 2022-04-23 DIAGNOSIS — R2681 Unsteadiness on feet: Secondary | ICD-10-CM

## 2022-04-23 DIAGNOSIS — M6281 Muscle weakness (generalized): Secondary | ICD-10-CM | POA: Diagnosis not present

## 2022-04-23 DIAGNOSIS — M25652 Stiffness of left hip, not elsewhere classified: Secondary | ICD-10-CM

## 2022-04-23 DIAGNOSIS — R2689 Other abnormalities of gait and mobility: Secondary | ICD-10-CM

## 2022-04-23 DIAGNOSIS — R293 Abnormal posture: Secondary | ICD-10-CM

## 2022-04-23 NOTE — Therapy (Signed)
OUTPATIENT PHYSICAL THERAPY TREATMENT NOTE   Patient Name: [name] MRN: [id number] DOB:1940/12/30, 82 y.o., female Today's Date: 04/23/2022  PCP: [name], MD  REFERRING PROVIDER: [name], MD      END OF SESSION:   PT End of Session - 04/23/22 0933     Visit Number 11    Number of Visits 14    Date for PT Re-Evaluation 05/08/22    Authorization Type [employer] Medicare    Authorization Time Period no co-pay, no visit limit    Progress Note Due on Visit 20    PT Start Time 0930    PT Stop Time 1010    PT Time Calculation (min) 40 min    Equipment Utilized During Treatment Gait belt    Activity Tolerance Patient tolerated treatment well    Behavior During Therapy WFL for tasks assessed/performed                   Past Medical History:  Diagnosis Date   Allergy    Anemia    childhood   Anxiety    Aortic stenosis    Arterial stenosis    mesenteric   Arthritis    Breast cancer 12/05/10   R breast, inv mammary, in situ,ER/PR +,HER2 -   Cancer    Carcinoma of breast treated with adjuvant chemotherapy    CKD 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    Claudication    Coronary artery disease    stent 2007   Depression    Diabetes mellitus    Difficulty sleeping    Diverticulosis 12/10/03   Elevated cholesterol    Generalized weakness    GERD (gastroesophageal reflux disease)    Heart murmur    Hepatitis    as an infant   HSV-1 (herpes simplex virus 1) infection    Hyperlipidemia    Hypertension    Hypothyroidism    Osteoarthritis    PAD (peripheral artery disease)    Personal history of chemotherapy    Personal history of radiation therapy    Pneumonia    2014 september and March 2022   Rheumatoid arthritis    S/P aortic valve replacement with bioprosthetic valve 05/04/2020   [facility] Inspiris Resilia stented bovine pericardial tissue valve, size 23 mm   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   LIMA to D3, SVG to PDA, EVH via right thigh   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   Dr [name]   Spinal stenosis    Thyroid disease    Past Surgical History:  Procedure Laterality Date   ABDOMINAL AORTOGRAM W/LOWER EXTREMITY Bilateral 04/29/2019   Procedure: ABDOMINAL AORTOGRAM W/LOWER EXTREMITY;  Surgeon: Marty Heck, MD;  Location: [facility] [facility];  Service: Cardiovascular;  Laterality: Bilateral;   ABDOMINAL AORTOGRAM W/LOWER EXTREMITY Left 07/01/2019   Procedure: ABDOMINAL AORTOGRAM W/ Left LOWER EXTREMITY [place];  Surgeon: Marty Heck, MD;  Location: [place] [facility];  Service: Cardiovascular;  Laterality: Left;   ABDOMINAL AORTOGRAM W/LOWER EXTREMITY Left 03/03/2021   Procedure: ABDOMINAL AORTOGRAM W/LOWER EXTREMITY;  Surgeon: Cherre Robins, MD;  Location: [place] [facility];  Service: Cardiovascular;  Laterality: Left;   ABDOMINAL AORTOGRAM W/LOWER EXTREMITY Right 06/12/2021   Procedure: ABDOMINAL AORTOGRAM W/LOWER EXTREMITY;  Surgeon: Waynetta Sandy, MD;  Location: [facility];  Service: Cardiovascular;  Laterality: Right;  LOWER LEG   ABDOMINAL AORTOGRAM W/LOWER EXTREMITY N/A 03/07/2022   Procedure: ABDOMINAL AORTOGRAM W/LOWER  EXTREMITY;  Surgeon: [name], MD;  Location: [facility];  Service: Cardiovascular;  Laterality: N/A;   AMPUTATION Left 03/06/2021   Procedure: LEFT ABOVE KNEE AMPUTATION;  Surgeon: [name], MD;  Location: [place];  Service: Vascular;  Laterality: Left;   ANTERIOR AND POSTERIOR REPAIR N/A 04/05/2016   Procedure: ANTERIOR (CYSTOCELE) AND POSTERIOR REPAIR (RECTOCELE);  Surgeon: [name], MD;  Location: [facility];  Service: Gynecology;  Laterality: N/A;   AORTIC VALVE REPLACEMENT N/A 05/04/2020   Procedure: AORTIC VALVE REPLACEMENT (AVR) USING INSPIRIS 23MM RESILIA AORTIC VALVE;  Surgeon: Rexene Alberts, MD;  Location: [place];  Service: Open Heart Surgery;  Laterality: N/A;   BREAST BIOPSY     BREAST LUMPECTOMY  1983   benign biopsy   BREAST LUMPECTOMY  Right 12/29/2010   snbx, ER/PR +, Her2 -, 0/1 node pos.   CARPAL TUNNEL RELEASE Bilateral 9/12,8,12   CHOLECYSTECTOMY     COLONOSCOPY N/A 02/09/2013   Procedure: COLONOSCOPY;  Surgeon: [name], MD;  [facility]: [facility];  Service: Endoscopy;  Laterality: N/A;   COLONOSCOPY     CORONARY ANGIOPLASTY     CORONARY ARTERY BYPASS GRAFT N/A 05/04/2020   Procedure: CORONARY ARTERY BYPASS GRAFTING (CABG), ON PUMP, TIMES TWO, USING LEFT INTERNAL MAMMARY ARTERY AND RIGHT ENDOSCOPICALLY HARVESTED GREATER SAPHENOUS VEIN;  Surgeon: Rexene Alberts, MD;  Location: [place];  Service: Open Heart Surgery;  Laterality: N/A;   DILATION AND CURETTAGE OF UTERUS     ESOPHAGOGASTRODUODENOSCOPY N/A 02/09/2013   Procedure: ESOPHAGOGASTRODUODENOSCOPY (EGD);  Surgeon: [name], MD;  Location: [name];  Service: Endoscopy;  Laterality: N/A;   FOOT SPURS     HYSTEROSCOPY WITH D & C N/A 09/11/2012   Procedure: DILATATION AND CURETTAGE ;  Surgeon: [name], MD;  Location: [facility];  Service: Gynecology;  Laterality: N/A;   KNEE SURGERY  2007   LAPAROSCOPIC ASSISTED VAGINAL HYSTERECTOMY N/A 04/05/2016   Procedure: LAPAROSCOPIC ASSISTED VAGINAL HYSTERECTOMY possible BSO;  Surgeon: [name], MD;  Location: [facility];  Service: Gynecology;  Laterality: N/A;   LUMBAR LAMINECTOMY/DECOMPRESSION MICRODISCECTOMY N/A 10/20/2014   Procedure: MICRO LUMBER DECOMPRESSION L3-4 L4-5;  Surgeon: [name], MD;  Location: [facility];  Service: Orthopedics;  Laterality: N/A;   LUMBAR LAMINECTOMY/DECOMPRESSION MICRODISCECTOMY Bilateral 10/13/2015   Procedure: MICRO LUMBAR DECOMPRESSION L5 - S1 AND REDO DECOMPRESSION L4 - L5 AND REMOVAL OF FACET CYST L4 - L5 2 LEVELS;  Surgeon: [name], MD;  Location: [facility];  Service: Orthopedics;  Laterality: Bilateral;   PERIPHERAL VASCULAR ATHERECTOMY Right 06/12/2021   Procedure: PERIPHERAL VASCULAR ATHERECTOMY;  Surgeon: Waynetta Sandy, MD;  Location: [facility] [facility];   Service: Cardiovascular;  Laterality: Right;  SFA/POP/AT   PERIPHERAL VASCULAR INTERVENTION Right 04/29/2019   Procedure: PERIPHERAL VASCULAR INTERVENTION;  Surgeon: Marty Heck, MD;  Location: [facility];  Service: Cardiovascular;  Laterality: Right;   PERIPHERAL VASCULAR INTERVENTION Right 06/12/2021   Procedure: PERIPHERAL VASCULAR INTERVENTION;  Surgeon: Waynetta Sandy, MD;  Location: [facility];  Service: Cardiovascular;  Laterality: Right;  SFP/POPLITEAL/AT   PERIPHERAL VASCULAR INTERVENTION  03/07/2022   Procedure: PERIPHERAL VASCULAR INTERVENTION;  Surgeon: [name], MD;  Location: [facility];  Service: Cardiovascular;;   PORT-A-CATH REMOVAL  04/17/2011   Procedure: REMOVAL PORT-A-CATH;  Surgeon: [name], MD;  Location: [facility];  Service: General;  Laterality: Left;   PORTACATH PLACEMENT  02/07/2011   Procedure: INSERTION PORT-A-CATH;  Surgeon: [name], MD;  Location[facility];  Service: General;  Laterality: N/A;   RIGHT/LEFT HEART CATH AND CORONARY ANGIOGRAPHY N/A 03/17/2020   Procedure: RIGHT/LEFT HEART CATH AND CORONARY ANGIOGRAPHY;  Surgeon: Burnell Blanks, MD;  Location: [facility];  Service: Cardiovascular;  Laterality: N/A;   TEE WITHOUT CARDIOVERSION N/A 05/04/2020   Procedure: TRANSESOPHAGEAL ECHOCARDIOGRAM (TEE);  Surgeon: Rexene Alberts, MD;  Location: [place];  Service: Open Heart Surgery;  Laterality: N/A;   UPPER GASTROINTESTINAL ENDOSCOPY     Patient Active Problem List   Diagnosis Date Noted   Above knee amputation of left lower extremity 03/13/2021   Sinus tachycardia 03/11/2021   Elevated troponin 03/08/2021   Stroke-like symptoms 03/08/2021   Anemia of chronic disease 03/08/2021   Acute supraglottitis with epiglottitis 02/05/2021   Epiglottitis 02/05/2021   Reactive airway disease 01/20/2021   Diabetic peripheral neuropathy 12/19/2020   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/2020   Primary insomnia 11/02/2020   Irritation of eyelid 07/26/2020   S/P aortic valve replacement with bioprosthetic valve 05/04/2020   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   Peripheral artery disease    Seronegative rheumatoid arthritis of multiple sites 04/07/2019   Scoliosis deformity of spine 02/03/2019   Osteoarthritis of hip 11/17/2018   Claudication    Chronic headaches 11/12/2017   Degeneration of lumbar intervertebral disc 06/13/2017   Hyperkalemia 06/03/2017   Stage 3b chronic kidney disease (CKD)    Mass of left adrenal gland 04/19/2017   Spinal stenosis of lumbar region 10/20/2014   Hyperlipidemia 03/16/2013   Dense breasts 12/22/2012   Anxiety state 04/16/2012   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/16/2012   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   Osteoarthritis of knee 04/16/2012   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 female breast 12/20/2010   History of breast cancer 12/05/2010   Depression 09/14/2008   GERD 09/14/2008   Type 2 diabetes mellitus with neurologic complication, with long-term current use of insulin 05/20/2008   Essential hypertension 04/17/2007   Coronary atherosclerosis 04/17/2007   Hypothyroidism 04/03/2007    REFERRING DIAG: [id number] (ICD-10-CM) - Above knee amputation of left lower extremity   ONSET DATE: 02/02/2022 MD referral to PT   THERAPY DIAG:  Other abnormalities of gait and mobility  Unsteadiness on feet  Muscle weakness (generalized)  Stiffness of left hip, not elsewhere classified  Abnormal posture  Rationale for Evaluation and Treatment Rehabilitation  PERTINENT HISTORY: Left TFA, DM2, neuropathy, PVD, CKD st4, RLE stent, aortic stenosis, CAD, CABG X 2, HTN, OA, lumbar spinal stenosis, breast CA   PRECAUTIONS: Fall  SUBJECTIVE:  SUBJECTIVE STATEMENT:  she has been able to do more with cane in house this week.  PAIN:  Are you having pain? Yes: NPRS scale: today  0/10 and in last week 0/10, when doing stretch of hip flexors 6/10 Pain location: right lumbar  Pain description: achy Aggravating factors: walking without shoes with rollator Relieving factors: sit down   OBJECTIVE: (objective measures completed at initial evaluation unless otherwise dated) COGNITION: 02/07/2022 / Eval: Overall cognitive status: Within functional limits for tasks assessed   MUSCLE LENGTH: 02/07/2022 / Elizabeth SauerMarcello Moores test: Left -37 deg   POSTURE: 02/07/2022 / Eval:   rounded shoulders, forward head, increased lumbar lordosis, increased thoracic kyphosis, flexed trunk , and weight shift right   LOWER EXTREMITY ROM:   ROM P:passive  A:active Right Eval 02/07/22  Hip flexion    Hip extension Standing -27* excessive lordosis  Hip abduction    Hip adduction    Hip internal rotation    Hip external rotation    Knee flexion    Knee extension    Ankle dorsiflexion    Ankle plantarflexion    Ankle inversion    Ankle eversion     (Blank rows = not tested)   LOWER EXTREMITY MMT:   MMT Right eval Left eval  Hip flexion      Hip extension      Hip abduction      Hip adduction      Hip internal rotation      Hip external rotation      Knee flexion      Knee extension      Ankle dorsiflexion      Ankle plantarflexion      Ankle inversion      Ankle eversion      (Blank rows = not tested)   TRANSFERS: 02/07/2022 / Eval:  Sit to stand: SBA requires UE assist on armrest, prosthetic knee locked delayed in process impairing stabilization upon arising. Uses back of legs against chair & external support to stabilize 02/07/2022 / Eval:  Stand to sit: SBA uses back of legs against chair for stability. External support required & armrests on chair.    GAIT: 02/07/2022 / Eval: Gait pattern: step to pattern, decreased arm swing-  Left, decreased step length- Right, decreased stance time- Left, circumduction- Left, Left hip hike, antalgic, lateral hip instability, trunk flexed, and abducted- Left Distance walked: 100' Assistive device utilized: Lobbyist with HHA (required) and Walker - 4 wheeled Level of assistance: Mod A with SBQC / HHA and SBA / verbal cues for rollator Gait velocity: 1.22 ft/sec with rollator walker   FUNCTIONAL TESTs:  Eval (02/07/2022): Timed up and go (TUG): 28.97 sec with rollator Berg Balance Scale: 21/56  CURRENT PROSTHETIC WEAR ASSESSMENT:  02/07/2022 / Eval: Patient is independent with: skin check, residual limb care, and prosthetic cleaning Patient is dependent with: correct ply sock adjustment, proper wear schedule/adjustment, and proper weight-bearing schedule/adjustment Donning prosthesis: Modified independence per pt report Doffing prosthesis: Modified independence per pt report Prosthetic wear tolerance: ~8 hours/day, 7 days/week (~60-70% of awake hours) Prosthetic weight bearing tolerance: 8 minutes with no c/o limb pain Residual limb condition: pt reports no issues Prosthetic description: silicon liner with suction ring suspension, ischial containment socket with flexible inner socket, non-hydraulic knee,        TODAY'S TREATMENT:  DATE:  04/23/2022: Prosthetic Training with Transfemoral prosthesis: PT instructed pt in donning with no air pocket in distal liner, proper prosthesis rotation and let air out of socket.  Pt verbalized understanding.  Pt got a power w/c from friend.  PT advised to be cautious with using it so much it becomes habit limiting her mobility / endurance.  She wants to use power w/c to get around yard.  Pt amb 75' X 4 with SBQC working on scanning right / left and up / down with CGA. She only touched PTs hand for stabilization 2x  during entire session.     04/16/2022: Prosthetic Training with Transfemoral prosthesis: 5 times sit to stand pushing off 18" chair seat with RUE and no touch to stabilize Pt amb 175' with intermittent touch LUE on PTs hand 4 times in first 150' then RLE fatigue required HHA for last 25'.  Pt amb 100' X 2 with intermittent touch with CGA. Pt neg ramp & curb with SBQC with HHA / min-modA with cues on technique. 2 reps with improved understanding.  PT demo & verbal cues on picking up item from floor. Chair behind & in front for safety. Pt able to pick up hand sanitizer 3 times with PT supervision & cues. Pt verbalized understanding including set-up to practice at home.   04/09/2022: Prosthetic Training with Transfemoral prosthesis: PT reviewed walking program of short high frequency over awake hours, long walk max tolerable & medium distance 4+/day. Pt verbalized understanding.  PT verbally reviewed sit to stand & stand to sit technique. Pt return demo understanding.  Pt able to stand 2 min without UE support safely Pt amb 150' X 2 with [facility] with close supervision with touch to stabilize 6-8X per 150'. This is improvement from [facility] with release 3-5 steps.  PT demo & verbal cues on loading RW in car if she is going to drive short distances from her home. Pt verbalized understanding.     HOME EXERCISE PROGRAM: Access Code: [email or web address] URL: [email or web address] Date: 03/12/2022 Prepared by: Jamey Reas  Exercises - sit to stand to sit with counter  - 2-3 x daily - 7 x weekly - 1 sets - 10 reps - 5 seconds hold - Modified Thomas Stretch  - 2-3 x daily - 7 x weekly - 1 sets - 2-3 reps - 20-30 seconds hold - wide stance head motions eyes open  - 1 x daily - 4-7 x weekly - 1 sets - 10 reps - 2 seconds hold - Feet Apart with Eyes Closed with Head Motions  - 1 x daily - 4-7 x weekly - 1 sets - 10 reps - 2 seconds hold - Wide stance on Foam Pad head movements  - 1 x daily - 4-7 x  weekly - 1 sets - 10 reps - 2 seconds hold - Ankle Alphabet in Elevation  - 1 x daily - 7 x weekly - 3 sets - 1 reps - 10-15 minutes hold  Stand in front of sink with one chair back on each side of you and 3rd chair behind you:  Your prosthetic toes should be back ~1" to your right toes.  Both feet facing forward about hip width apart.   Turn in circles using cane.  When going to left lead with left leg.  When going to right, lead with right leg.  Reach both hands over head and return both hands to counter.  Follow your hands with your eyes.  Keep weight on both  legs.  Turn your upper body to place item with both hands on back of chair to each side. Stop at counter between directions.  Follow your hands with your eyes.  Keep weight on both legs. Lean to each side placing item in chair bottom with one hand (use right hand going to right & left hand going to left).  Follow your hand with your eyes.  Turn around you are facing chair that was behind you.  Make sure that your back side does not touch counter when you bend forward.  Lean forward to place an item in chair seat then stand back up straight.   Stand up & sit down using chair seat to push up. Goal is to not touch sink unless off balance.   standing Up sit at edge of chair with right foot near chair, left foot /prosthesis with heel touching. push forward to SMELL flower,  keep prosthetic foot in contact with floor.  then straight up back.  Sitting down: stand with right foot near chair & left prosthetic foot forward a little bow to SMELL to flower unlocking prosthetic knee reach with LEFT hand first to sit.    ASSESSMENT: CLINICAL IMPRESSION: Patient appears to have better understanding of donning prosthesis with liner direct contact distal limb.  Pt's gait with SBQC continues to improve.  PT does anticipate discharge at end of POC 05/08/2022.   OBJECTIVE IMPAIRMENTS: Abnormal gait, decreased activity tolerance, decreased balance,  decreased endurance, decreased knowledge of condition, decreased knowledge of use of DME, decreased mobility, difficulty walking, decreased ROM, decreased strength, impaired flexibility, postural dysfunction, prosthetic dependency , and pain.    ACTIVITY LIMITATIONS: carrying, lifting, bending, sitting, standing, sleeping, stairs, transfers, and locomotion level   PARTICIPATION LIMITATIONS: meal prep, cleaning, laundry, community activity, and yard work   PERSONAL FACTORS: Age, Fitness, Past/current experiences, Time since onset of injury/illness/exacerbation, and 3+ comorbidities: see PMH  are also affecting patient's functional outcome.    REHAB POTENTIAL: Good   CLINICAL DECISION MAKING: Evolving/moderate complexity   EVALUATION COMPLEXITY: Moderate     GOALS: Goals reviewed with patient? Yes   SHORT TERM GOALS: Target date: 04/09/2022   Patient verbalizes & demonstrates understanding of updated HEP. Baseline: SEE OBJECTIVE DATA Goal status: MET 04/09/2022 2.  Patient able to balance 2 min without UE support with supervision. Baseline: SEE OBJECTIVE DATA Goal status: MET 04/09/2022   3.  Patient ambulates 60' with [facility] & prosthesis with minA. Baseline: SEE OBJECTIVE DATA Goal status: MET 04/09/2022     LONG TERM GOALS: Target date: 05/08/2022   Patient demonstrates & verbalized understanding of prosthetic care to enable safe utilization of prosthesis. Baseline: SEE OBJECTIVE DATA Goal status:  Ongoing 04/02/2022   Patient tolerates prosthesis wear >90% of awake hours without skin or limb pain issues. Baseline: SEE OBJECTIVE DATA Goal status: Ongoing 04/02/2022   Berg Balance >/= 36/56 to indicate lower fall risk Baseline: SEE OBJECTIVE DATA Goal status: Ongoing 04/02/2022   Patient ambulates >200' with prosthesis & SBQC modified independently Baseline: SEE OBJECTIVE DATA Goal status:   Ongoing 04/02/2022   Patient negotiates ramps, curbs & stairs with single rail with  prosthesis & SBQC with family safely.  Baseline: SEE OBJECTIVE DATA Goal status: Ongoing 04/02/2022   Patient verbalizes & demonstrates understanding of ongoing HEP Baseline: SEE OBJECTIVE DATA Goal status: Ongoing 04/02/2022   7.  Patient reports low back pain increases </= 2 increments with above activities. Baseline: SEE OBJECTIVE DATA Goal status: Ongoing 04/02/2022  PLAN:   PT FREQUENCY: 1x/week   PT DURATION:  90 days    PLANNED INTERVENTIONS: Therapeutic exercises, Therapeutic activity, Neuromuscular re-education, Balance training, Gait training, Patient/Family education, Self Care, Stair training, Vestibular training, Prosthetic training, DME instructions, Manual therapy, Re-evaluation, and Physical performance testng.   PLAN FOR NEXT SESSION:   work towards [place],  continue to  progress prosthetic gait with [facility] & balance with instructions for home.   Jamey Reas, PT, DPT 04/23/2022, 12:51 PM

## 2022-04-30 ENCOUNTER — Ambulatory Visit (INDEPENDENT_AMBULATORY_CARE_PROVIDER_SITE_OTHER): Payer: Medicare Other | Admitting: Physical Therapy

## 2022-04-30 ENCOUNTER — Encounter: Payer: Self-pay | Admitting: Physical Therapy

## 2022-04-30 DIAGNOSIS — R2689 Other abnormalities of gait and mobility: Secondary | ICD-10-CM | POA: Diagnosis not present

## 2022-04-30 DIAGNOSIS — M25652 Stiffness of left hip, not elsewhere classified: Secondary | ICD-10-CM

## 2022-04-30 DIAGNOSIS — R2681 Unsteadiness on feet: Secondary | ICD-10-CM

## 2022-04-30 DIAGNOSIS — R293 Abnormal posture: Secondary | ICD-10-CM

## 2022-04-30 DIAGNOSIS — M6281 Muscle weakness (generalized): Secondary | ICD-10-CM | POA: Diagnosis not present

## 2022-04-30 NOTE — Therapy (Signed)
OUTPATIENT PHYSICAL THERAPY TREATMENT NOTE   Patient Name: [name] MRN: [id number] DOB:10-22-1940, 82 y.o., female Today's Date: 04/30/2022  PCP: Loyola Mast, MD  REFERRING PROVIDER: Loyola Mast, MD      END OF SESSION:   PT End of Session - 04/30/22 0917     Visit Number 12    Number of Visits 14    Date for PT Re-Evaluation 05/08/22    Authorization Type [employer] Medicare    Authorization Time Period no co-pay, no visit limit    Progress Note Due on Visit 20    PT Start Time 0915    PT Stop Time 0957    PT Time Calculation (min) 42 min    Equipment Utilized During Treatment Gait belt    Activity Tolerance Patient tolerated treatment well    Behavior During Therapy WFL for tasks assessed/performed                    Past Medical History:  Diagnosis Date   Allergy    Anemia    childhood   Anxiety    Aortic stenosis    Arterial stenosis    mesenteric   Arthritis    Breast cancer 12/05/10   R breast, inv mammary, in situ,ER/PR +,HER2 -   Cancer    Carcinoma of breast treated with adjuvant chemotherapy    CKD 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    Claudication    Coronary artery disease    stent 2007   Depression    Diabetes mellitus    Difficulty sleeping    Diverticulosis 12/10/03   Elevated cholesterol    Generalized weakness    GERD (gastroesophageal reflux disease)    Heart murmur    Hepatitis    as an infant   HSV-1 (herpes simplex virus 1) infection    Hyperlipidemia    Hypertension    Hypothyroidism    Osteoarthritis    PAD (peripheral artery disease)    Personal history of chemotherapy    Personal history of radiation therapy    Pneumonia    2014 september and March 2022   Rheumatoid arthritis    S/P aortic valve replacement with bioprosthetic valve 05/04/2020   [facility] Inspiris Resilia stented bovine pericardial tissue valve, size 23 mm   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   LIMA to D3, SVG to PDA, EVH via right thigh    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   Dr Charna Elizabeth   Spinal stenosis    Thyroid disease    Past Surgical History:  Procedure Laterality Date   ABDOMINAL AORTOGRAM W/LOWER EXTREMITY Bilateral 04/29/2019   Procedure: ABDOMINAL AORTOGRAM W/LOWER EXTREMITY;  Surgeon: Cephus Shelling, MD;  Location: [facility] INVASIVE CV LAB;  Service: Cardiovascular;  Laterality: Bilateral;   ABDOMINAL AORTOGRAM W/LOWER EXTREMITY Left 07/01/2019   Procedure: ABDOMINAL AORTOGRAM W/ Left LOWER EXTREMITY [place];  Surgeon: Cephus Shelling, MD;  Location: [facility] INVAS[facility] CV LAB;  Service: Cardiovascular;  Laterality: Left;   ABDOMINAL AORTOGRAM W/LOWER EXTREMITY Left 03/03/2021   Procedure: ABDOMINAL AORTOGRAM W/LOWER EXTREMITY;  Surgeon: Leonie Douglas, MD;  Location: [facility] INVASIVE CV LAB;  Service: Cardiovascular;  Laterality: Left;   ABDOMINAL AORTOGRAM W/LOWER EXTREMITY Right 06/12/2021   Procedure: ABDOMINAL AORTOGRAM W/LOWER EXTREMITY;  Surgeon: Maeola Harman, MD;  Location: [facility] INVASIVE CV LAB;  Service: Cardiovascular;  Laterality: Right;  LOWER LEG   ABDOMINAL AORTOGRAM W/LOWER EXTREMITY N/A 03/07/2022   Procedure: ABDOMINAL AORTOGRAM  W/LOWER EXTREMITY;  Surgeon: Victorino Sparrowobins, Joshua E, MD;  [facility];  Service: Cardiovascular;  Laterality: N/A;   AMPUTATION Left 03/06/2021   Procedure: LEFT ABOVE KNEE AMPUTATION;  Surgeon: Victorino Sparrowobins, Joshua E, MD;  [facility];  Service: Vascular;  Laterality: Left;   ANTERIOR AND POSTERIOR REPAIR N/A 04/05/2016   Procedure: ANTERIOR (CYSTOCELE) AND POSTERIOR REPAIR (RECTOCELE);  Surgeon: Zelphia CairoGretchen Adkins, MD;  Location: [facility] ORS;  Service: Gynecology;  Laterality: N/A;   AORTIC VALVE REPLACEMENT N/A 05/04/2020   Procedure: AORTIC VALVE REPLACEMENT (AVR) USING INSPIRIS 23MM RESILIA AORTIC VALVE;  Surgeon: Purcell Nailswen, Clarence H, MD;  [facility];  Service: Open Heart Surgery;  Laterality: N/A;   BREAST BIOPSY     BREAST LUMPECTOMY  1983   benign biopsy   BREAST  LUMPECTOMY Right 12/29/2010   snbx, ER/PR +, Her2 -, 0/1 node pos.   CARPAL TUNNEL RELEASE Bilateral 9/12,8,12   CHOLECYSTECTOMY     COLONOSCOPY N/A 02/09/2013   Procedure: COLONOSCOPY;  Surgeon: Hart Carwinora M Brodie, MD;  Location: [facility] ENDOS[facility];  Service: Endoscopy;  Laterality: N/A;   COLONOSCOPY     CORONARY ANGIOPLASTY     CORONARY ARTERY BYPASS GRAFT N/A 05/04/2020   Procedure: CORONARY ARTERY BYPASS GRAFTING (CABG), ON PUMP, TIMES TWO, USING LEFT INTERNAL MAMMARY ARTERY AND RIGHT ENDOSCOPICALLY HARVESTED GREATER SAPHENOUS VEIN;  Surgeon: Purcell Nailswen, Clarence H, MD;  [facility];  Service: Open Heart Surgery;  Laterality: N/A;   DILATION AND CURETTAGE OF UTERUS     ESOPHAGOGASTRODUODENOSCOPY N/A 02/09/2013   Procedure: ESOPHAGOGASTRODUODENOSCOPY (EGD);  Surgeon: Hart Carwinora M Brodie, MD;  [facility];  Service: Endoscopy;  Laterality: N/A;   FOOT SPURS     HYSTEROSCOPY WITH D & C N/A 09/11/2012   Procedure: DILATATION AND CURETTAGE ;  Surgeon: Zelphia CairoGretchen Adkins, MD;  Location: [facility];  Service: Gynecology;  Laterality: N/A;   KNEE SURGERY  2007   LAPAROSCOPIC ASSISTED VAGINAL HYSTERECTOMY N/A 04/05/2016   Procedure: LAPAROSCOPIC ASSISTED VAGINAL HYSTERECTOMY possible BSO;  Surgeon: Zelphia CairoGretchen Adkins, MD;  Location: [facility];  Service: Gynecology;  Laterality: N/A;   LUMBAR LAMINECTOMY/DECOMPRESSION MICRODISCECTOMY N/A 10/20/2014   Procedure: MICRO LUMBER DECOMPRESSION L3-4 L4-5;  Surgeon: Jene EveryJeffrey Beane, MD;  Location: [facility];  Service: Orthopedics;  Laterality: N/A;   LUMBAR LAMINECTOMY/DECOMPRESSION MICRODISCECTOMY Bilateral 10/13/2015   Procedure: MICRO LUMBAR DECOMPRESSION L5 - S1 AND REDO DECOMPRESSION L4 - L5 AND REMOVAL OF FACET CYST L4 - L5 2 LEVELS;  Surgeon: Jene EveryJeffrey Beane, MD;  Location: [facility];  Service: Orthopedics;  Laterality: Bilateral;   PERIPHERAL VASCULAR ATHERECTOMY Right 06/12/2021   Procedure: PERIPHERAL VASCULAR ATHERECTOMY;  Surgeon: Maeola Harmanain, Brandon Christopher, MD;  [facility];  Service: Cardiovascular;  Laterality: Right;  SFA/POP/AT   PERIPHERAL VASCULAR INTERVENTION Right 04/29/2019   Procedure: PERIPHERAL VASCULAR INTERVENTION;  Surgeon: Cephus Shellinglark, Christopher J, MD;  Location: [facility] CV LAB;  Service: Cardiovascular;  Laterality: Right;   PERIPHERAL VASCULAR INTERVENTION Right 06/12/2021   Procedure: PERIPHERAL VASCULAR INTERVENTION;  Surgeon: Maeola Harmanain, Brandon Christopher, MD;  [facility];  Service: Cardiovascular;  Laterality: Right;  SFP/POPLITEAL/AT   PERIPHERAL VASCULAR INTERVENTION  03/07/2022   Procedure: PERIPHERAL VASCULAR INTERVENTION;  Surgeon: Victorino Sparrowobins, Joshua E, MD;  [facility];  Service: Cardiovascular;;   PORT-A-CATH REMOVAL  04/17/2011   Procedure: REMOVAL PORT-A-CATH;  Surgeon: Emelia LoronMatthew Wakefield, MD;  Location: [facility];  Service: General;  Laterality: Left;   PORTACATH PLACEMENT  02/07/2011   Procedure: INSERTION  PORT-A-CATH;  Surgeon: Emelia Loron, MD;  Location: [facility];  Service: General;  Laterality: N/A;   RIGHT/LEFT HEART CATH AND CORONARY ANGIOGRAPHY N/A 03/17/2020   Procedure: RIGHT/LEFT HEART CATH AND CORONARY ANGIOGRAPHY;  Surgeon: Kathleene Hazel, MD;  Location: [facility] CV LAB;  Service: Cardiovascular;  Laterality: N/A;   TEE WITHOUT CARDIOVERSION N/A 05/04/2020   Procedure: TRANSESOPHAGEAL ECHOCARDIOGRAM (TEE);  Surgeon: Purcell Nails, MD;  Location[facility] OR;  Service: Open Heart Surgery;  Laterality: N/A;   UPPER GASTROINTESTINAL ENDOSCOPY     Patient Active Problem List   Diagnosis Date Noted   Above knee amputation of left lower extremity 03/13/2021   Sinus tachycardia 03/11/2021   Elevated troponin 03/08/2021   Stroke-like symptoms 03/08/2021   Anemia of chronic disease 03/08/2021   Acute supraglottitis with epiglottitis 02/05/2021   Epiglottitis 02/05/2021   Reactive airway disease 01/20/2021   Diabetic peripheral neuropathy 12/19/2020    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   Primary insomnia 11/02/2020   Irritation of eyelid 07/26/2020   S/P aortic valve replacement with bioprosthetic valve 05/04/2020   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   Peripheral artery disease    Seronegative rheumatoid arthritis of multiple sites 04/07/2019   Scoliosis deformity of spine 02/03/2019   Osteoarthritis of hip 11/17/2018   Claudication    Chronic headaches 11/12/2017   Degeneration of lumbar intervertebral disc 06/13/2017   Hyperkalemia 06/03/2017   Stage 3b chronic kidney disease (CKD)    Mass of left adrenal gland 04/19/2017   Spinal stenosis of lumbar region 10/20/2014   Hyperlipidemia 03/16/2013   Dense breasts 12/22/2012   Anxiety state 04/16/2012   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/16/2012   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   Osteoarthritis of knee 04/16/2012   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 female breast 12/20/2010   History of breast cancer 12/05/2010   Depression 09/14/2008   GERD 09/14/2008   Type 2 diabetes mellitus with neurologic complication, with long-term current use of insulin 05/20/2008   Essential hypertension 04/17/2007   Coronary atherosclerosis 04/17/2007   Hypothyroidism 04/03/2007    REFERRING DIAG: W09.811B (ICD-10-CM) - Above knee amputation of left lower extremity   ONSET DATE: 02/02/2022 MD referral to PT   THERAPY DIAG:  Other abnormalities of gait and mobility  Unsteadiness on feet  Muscle weakness (generalized)  Stiffness of left hip, not elsewhere classified  Abnormal posture  Rationale for Evaluation and Treatment Rehabilitation  PERTINENT HISTORY: Left TFA, DM2, neuropathy, PVD, CKD st4, RLE stent, aortic stenosis, CAD, CABG X 2, HTN, OA, lumbar spinal stenosis, breast CA   PRECAUTIONS: Fall  SUBJECTIVE:  SUBJECTIVE STATEMENT:  She has been walking more as her dtr-law is spring cleaning house.  No issues.   PAIN:  Are you having pain? Yes: NPRS scale: today 0/10 and in last week 0/10, when doing stretch of hip flexors 3/10 Pain location: right lumbar  Pain description: achy Aggravating factors: walking without shoes with rollator Relieving factors: sit down   OBJECTIVE: (objective measures completed at initial evaluation unless otherwise dated) COGNITION: 02/07/2022 / Eval: Overall cognitive status: Within functional limits for tasks assessed   MUSCLE LENGTH: 02/07/2022 / Rhys MartiniMaisie Fus test: Left -37 deg   POSTURE: 02/07/2022 / Eval:   rounded shoulders, forward head, increased lumbar lordosis, increased thoracic kyphosis, flexed trunk , and weight shift right   LOWER EXTREMITY ROM:   ROM P:passive  A:active Right Eval 02/07/22  Hip flexion    Hip extension Standing -27* excessive lordosis  Hip abduction    Hip adduction    Hip internal rotation    Hip external rotation    Knee flexion    Knee extension    Ankle dorsiflexion    Ankle plantarflexion    Ankle inversion    Ankle eversion     (Blank rows = not tested)   LOWER EXTREMITY MMT:   MMT Right eval Left eval  Hip flexion      Hip extension      Hip abduction      Hip adduction      Hip internal rotation      Hip external rotation      Knee flexion      Knee extension      Ankle dorsiflexion      Ankle plantarflexion      Ankle inversion      Ankle eversion      (Blank rows = not tested)   TRANSFERS: 02/07/2022 / Eval:  Sit to stand: SBA requires UE assist on armrest, prosthetic knee locked delayed in process impairing stabilization upon arising. Uses back of legs against chair & external support to stabilize 02/07/2022 / Eval:  Stand to sit: SBA uses back of legs against chair for stability. External support required & armrests on chair.    GAIT: 02/07/2022 / Eval: Gait  pattern: step to pattern, decreased arm swing- Left, decreased step length- Right, decreased stance time- Left, circumduction- Left, Left hip hike, antalgic, lateral hip instability, trunk flexed, and abducted- Left Distance walked: 100' Assistive device utilized: Counselling psychologist with HHA (required) and Walker - 4 wheeled Level of assistance: Mod A with SBQC / HHA and SBA / verbal cues for rollator Gait velocity: 1.22 ft/sec with rollator walker   FUNCTIONAL TESTs:  Eval (02/07/2022): Timed up and go (TUG): 28.97 sec with rollator Berg Balance Scale: 21/56  CURRENT PROSTHETIC WEAR ASSESSMENT:  02/07/2022 / Eval: Patient is independent with: skin check, residual limb care, and prosthetic cleaning Patient is dependent with: correct ply sock adjustment, proper wear schedule/adjustment, and proper weight-bearing schedule/adjustment Donning prosthesis: Modified independence per pt report Doffing prosthesis: Modified independence per pt report Prosthetic wear tolerance: ~8 hours/day, 7 days/week (~60-70% of awake hours) Prosthetic weight bearing tolerance: 8 minutes with no c/o limb pain Residual limb condition: pt reports no issues Prosthetic description: silicon liner with suction ring suspension, ischial containment socket with flexible inner socket, non-hydraulic knee,        TODAY'S TREATMENT:  DATE:  04/30/2022: Prosthetic Training with Transfemoral prosthesis: PT recommended pt to see prosthetist for dynamic alignment. Pt verbalized understanding.  Pt amb 100' with SBQC with 2 LOBs requiring touch on PT hand, then able to walk 25' further with HHA. Pt verbalized how when & how to use HHA to facilitate longer distances over time prior to intermittent claudication pain limits her.   Pt neg ramp & curb with [facility] & HHA with pt directing PT as if family member. She  needed PT cues for proper direction but seemed to understand.  PT reviewed elevation with RLE muscle activity 1-2 x/day for 15 min. Pt verbalized understanding.   Therapeutic Exercise: PT reviewed fitness plan / HEP to include stretches / flexibility, strength, balance (standing kicks & corner exercises) and endurance (short, medium, long walking program). Pt verbalized understanding including recommendation for ongoing.     04/23/2022: Prosthetic Training with Transfemoral prosthesis: PT instructed pt in donning with no air pocket in distal liner, proper prosthesis rotation and let air out of socket.  Pt verbalized understanding.  Pt got a power w/c from friend.  PT advised to be cautious with using it so much it becomes habit limiting her mobility / endurance.  She wants to use power w/c to get around yard.  Pt amb 75' X 4 with SBQC working on scanning right / left and up / down with CGA. She only touched PTs hand for stabilization 2x during entire session.     04/16/2022: Prosthetic Training with Transfemoral prosthesis: 5 times sit to stand pushing off 18" chair seat with RUE and no touch to stabilize Pt amb 175' with intermittent touch LUE on PTs hand 4 times in first 150' then RLE fatigue required HHA for last 25'.  Pt amb 100' X 2 with intermittent touch with CGA. Pt neg ramp & curb with SBQC with HHA / min-modA with cues on technique. 2 reps with improved understanding.  PT demo & verbal cues on picking up item from floor. Chair behind & in front for safety. Pt able to pick up hand sanitizer 3 times with PT supervision & cues. Pt verbalized understanding including set-up to practice at home.   HOME EXERCISE PROGRAM: Access Code: [email or web address] URL: [email or web address] Date: 03/12/2022 Prepared by: Vladimir Faster  Exercises - sit to stand to sit with counter  - 2-3 x daily - 7 x weekly - 1 sets - 10 reps - 5 seconds hold - Modified Thomas Stretch  - 2-3 x daily - 7 x  weekly - 1 sets - 2-3 reps - 20-30 seconds hold - wide stance head motions eyes open  - 1 x daily - 4-7 x weekly - 1 sets - 10 reps - 2 seconds hold - Feet Apart with Eyes Closed with Head Motions  - 1 x daily - 4-7 x weekly - 1 sets - 10 reps - 2 seconds hold - Wide stance on Foam Pad head movements  - 1 x daily - 4-7 x weekly - 1 sets - 10 reps - 2 seconds hold - Ankle Alphabet in Elevation  - 1 x daily - 7 x weekly - 3 sets - 1 reps - 10-15 minutes hold  Stand in front of sink with one chair back on each side of you and 3rd chair behind you:  Your prosthetic toes should be back ~1" to your right toes.  Both feet facing forward about hip width apart.   Turn in circles using cane.  When going to  left lead with left leg.  When going to right, lead with right leg.  Reach both hands over head and return both hands to counter.  Follow your hands with your eyes.  Keep weight on both legs.  Turn your upper body to place item with both hands on back of chair to each side. Stop at counter between directions.  Follow your hands with your eyes.  Keep weight on both legs. Lean to each side placing item in chair bottom with one hand (use right hand going to right & left hand going to left).  Follow your hand with your eyes.  Turn around you are facing chair that was behind you.  Make sure that your back side does not touch counter when you bend forward.  Lean forward to place an item in chair seat then stand back up straight.   Stand up & sit down using chair seat to push up. Goal is to not touch sink unless off balance.   standing Up sit at edge of chair with right foot near chair, left foot /prosthesis with heel touching. push forward to SMELL flower,  keep prosthetic foot in contact with floor.  then straight up back.  Sitting down: stand with right foot near chair & left prosthetic foot forward a little bow to SMELL to flower unlocking prosthetic knee reach with LEFT hand first to sit.     ASSESSMENT: CLINICAL IMPRESSION: PT reviewed ongoing HEP which she appears to understand.  Pt appears to understand how to direct family members to assist her as needed for community activities.  PT does anticipate discharge at end of POC 05/08/2022.   OBJECTIVE IMPAIRMENTS: Abnormal gait, decreased activity tolerance, decreased balance, decreased endurance, decreased knowledge of condition, decreased knowledge of use of DME, decreased mobility, difficulty walking, decreased ROM, decreased strength, impaired flexibility, postural dysfunction, prosthetic dependency , and pain.    ACTIVITY LIMITATIONS: carrying, lifting, bending, sitting, standing, sleeping, stairs, transfers, and locomotion level   PARTICIPATION LIMITATIONS: meal prep, cleaning, laundry, community activity, and yard work   PERSONAL FACTORS: Age, Fitness, Past/current experiences, Time since onset of injury/illness/exacerbation, and 3+ comorbidities: see PMH  are also affecting patient's functional outcome.    REHAB POTENTIAL: Good   CLINICAL DECISION MAKING: Evolving/moderate complexity   EVALUATION COMPLEXITY: Moderate     GOALS: Goals reviewed with patient? Yes   SHORT TERM GOALS: Target date: 04/09/2022   Patient verbalizes & demonstrates understanding of updated HEP. Baseline: SEE OBJECTIVE DATA Goal status: MET 04/09/2022 2.  Patient able to balance 2 min without UE support with supervision. Baseline: SEE OBJECTIVE DATA Goal status: MET 04/09/2022   3.  Patient ambulates 77' with [facility] & prosthesis with minA. Baseline: SEE OBJECTIVE DATA Goal status: MET 04/09/2022     LONG TERM GOALS: Target date: 05/08/2022   Patient demonstrates & verbalized understanding of prosthetic care to enable safe utilization of prosthesis. Baseline: SEE OBJECTIVE DATA Goal status:  Ongoing 04/02/2022   Patient tolerates prosthesis wear >90% of awake hours without skin or limb pain issues. Baseline: SEE OBJECTIVE DATA Goal  status: Ongoing 04/02/2022   Berg Balance >/= 36/56 to indicate lower fall risk Baseline: SEE OBJECTIVE DATA Goal status: Ongoing 04/02/2022   Patient ambulates >200' with prosthesis & SBQC modified independently Baseline: SEE OBJECTIVE DATA Goal status:   Ongoing 04/02/2022   Patient negotiates ramps, curbs & stairs with single rail with prosthesis & SBQC with family safely.  Baseline: SEE OBJECTIVE DATA Goal status: Ongoing 04/02/2022  Patient verbalizes & demonstrates understanding of ongoing HEP Baseline: SEE OBJECTIVE DATA Goal status: Ongoing 04/02/2022   7.  Patient reports low back pain increases </= 2 increments with above activities. Baseline: SEE OBJECTIVE DATA Goal status: Ongoing 04/02/2022   PLAN:   PT FREQUENCY: 1x/week   PT DURATION:  90 days    PLANNED INTERVENTIONS: Therapeutic exercises, Therapeutic activity, Neuromuscular re-education, Balance training, Gait training, Patient/Family education, Self Care, Stair training, Vestibular training, Prosthetic training, DME instructions, Manual therapy, Re-evaluation, and Physical performance testng.   PLAN FOR NEXT SESSION:  assess LTGs & discharge.   Vladimir Faster, PT, DPT 04/30/2022, 10:03 AM

## 2022-05-07 ENCOUNTER — Ambulatory Visit (INDEPENDENT_AMBULATORY_CARE_PROVIDER_SITE_OTHER): Payer: Medicare Other | Admitting: Physical Therapy

## 2022-05-07 ENCOUNTER — Encounter: Payer: Self-pay | Admitting: Physical Therapy

## 2022-05-07 DIAGNOSIS — R2689 Other abnormalities of gait and mobility: Secondary | ICD-10-CM

## 2022-05-07 DIAGNOSIS — R2681 Unsteadiness on feet: Secondary | ICD-10-CM

## 2022-05-07 DIAGNOSIS — M6281 Muscle weakness (generalized): Secondary | ICD-10-CM | POA: Diagnosis not present

## 2022-05-07 NOTE — Therapy (Signed)
OUTPATIENT PHYSICAL THERAPY TREATMENT NOTE & DISCHARGE SUMMARY   Patient Name: [name] MRN: [id number] DOB:11-15-1940, 82 y.o., female Today's Date: 05/07/2022  PCP: Loyola Mast, MD  REFERRING PROVIDER: Loyola Mast, MD   PHYSICAL THERAPY DISCHARGE SUMMARY  Visits from Start of Care: 13  Current functional level related to goals / functional outcomes: See below   Remaining deficits: See below   Education / Equipment: PT instructed in prosthetic training & HEP. Pt verbalizes & demonstrates understanding.    Patient agrees to discharge. Patient goals were partially met. Patient is being discharged due to meeting the stated rehab goals. & patient request.    END OF SESSION:   PT End of Session - 05/07/22 0929     Visit Number 13    Number of Visits 14    Date for PT Re-Evaluation 05/08/22    Authorization Type [employer] Medicare    Authorization Time Period no co-pay, no visit limit    PT Start Time 0929    PT Stop Time 0958    PT Time Calculation (min) 29 min    Equipment Utilized During Treatment Gait belt    Activity Tolerance Patient tolerated treatment well    Behavior During Therapy WFL for tasks assessed/performed                    Past Medical History:  Diagnosis Date   Allergy    Anemia    childhood   Anxiety    Aortic stenosis    Arterial stenosis    mesenteric   Arthritis    Breast cancer 12/05/10   R breast, inv mammary, in situ,ER/PR +,HER2 -   Cancer    Carcinoma of breast treated with adjuvant chemotherapy    CKD 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    Claudication    Coronary artery disease    stent 2007   Depression    Diabetes mellitus    Difficulty sleeping    Diverticulosis 12/10/03   Elevated cholesterol    Generalized weakness    GERD (gastroesophageal reflux disease)    Heart murmur    Hepatitis    as an infant   HSV-1 (herpes simplex virus 1) infection    Hyperlipidemia    Hypertension     Hypothyroidism    Osteoarthritis    PAD (peripheral artery disease)    Personal history of chemotherapy    Personal history of radiation therapy    Pneumonia    2014 september and March 2022   Rheumatoid arthritis    S/P aortic valve replacement with bioprosthetic valve 05/04/2020   [facility] Inspiris Resilia stented bovine pericardial tissue valve, size 23 mm   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   LIMA to D3, SVG to PDA, EVH via right thigh   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   Dr Charna Elizabeth   Spinal stenosis    Thyroid disease    Past Surgical History:  Procedure Laterality Date   ABDOMINAL AORTOGRAM W/LOWER EXTREMITY Bilateral 04/29/2019   Procedure: ABDOMINAL AORTOGRAM W/LOWER EXTREMITY;  Surgeon: Cephus Shelling, MD;  Location: [facility] INVASIVE CV LAB;  Service: Cardiovascular;  Laterality: Bilateral;   ABDOMINAL AORTOGRAM W/LOWER EXTREMITY Left 07/01/2019   Procedure: ABDOMINAL AORTOGRAM W/ Left LOWER EXTREMITY [place];  Surgeon: Cephus Shelling, MD;  Location: [facility] INVASIVE CV LAB;  Service: Cardiovascular;  Laterality: Left;   ABDOMINAL AORTOGRAM W/LOWER EXTREMITY Left 03/03/2021   Procedure: ABDOMINAL AORTOGRAM W/LOWER EXTREMITY;  Surgeon: Leonie Douglas, MD;  Location: [facility] INVASIVE CV LAB;  Service: Cardiovascular;  Laterality: Left;   ABDOMINAL AORTOGRAM W/LOWER EXTREMITY Right 06/12/2021   Procedure: ABDOMINAL AORTOGRAM W/LOWER EXTREMITY;  Surgeon: Maeola Harman, MD;  Location: [facility] INVASIVE CV LAB;  Service: Cardiovascular;  Laterality: Right;  LOWER LEG   ABDOMINAL AORTOGRAM W/LOWER EXTREMITY N/A 03/07/2022   Procedure: ABDOMINAL AORTOGRAM W/LOWER EXTREMITY;  Surgeon: Victorino Sparrow, MD;  Location: [facility] INVASIVE CV LAB;  Service: Cardiovascular;  Laterality: N/A;   AMPUTATION Left 03/06/2021   Procedure: LEFT ABOVE KNEE AMPUTATION;  Surgeon: Victorino Sparrow, MD;  Location: [facility] OR;  Service: Vascular;  Laterality: Left;   ANTERIOR AND POSTERIOR REPAIR N/A 04/05/2016   Procedure:  ANTERIOR (CYSTOCELE) AND POSTERIOR REPAIR (RECTOCELE);  Surgeon: Zelphia Cairo, MD;  Location: [facility] ORS;  Service: Gynecology;  Laterality: N/A;   AORTIC VALVE REPLACEMENT N/A 05/04/2020   Procedure: AORTIC VALVE REPLACEMENT (AVR) USING INSPIRIS RESILIA AORTIC VALVE;  Surgeon: Purcell Nails, MD;  Location: [facility] OR;  Service: Open Heart Surgery;  Laterality: N/A;   BREAST BIOPSY     BREAST LUMPECTOMY  1983   benign biopsy   BREAST LUMPECTOMY Right 12/29/2010   snbx, ER/PR +, Her2 -, 0/1 node pos.   CARPAL TUNNEL RELEASE Bilateral 9/12,8,12   CHOLECYSTECTOMY     COLONOSCOPY N/A 02/09/2013   Procedure: COLONOSCOPY;  Surgeon: Hart Carwin, MD;  Location: [facility] ENDOSCOPY;  Service: Endoscopy;  Laterality: N/A;   COLONOSCOPY     CORONARY ANGIOPLASTY     CORONARY ARTERY BYPASS GRAFT N/A 05/04/2020   Procedure: CORONARY ARTERY BYPASS GRAFTING (CABG), ON PUMP, TIMES TWO, USING LEFT INTERNAL MAMMARY ARTERY AND RIGHT ENDOSCOPICALLY HARVESTED GREATER SAPHENOUS VEIN;  Surgeon: Purcell Nails, MD;  Location: [facility] OR;  Service: Open Heart Surgery;  Laterality: N/A;   DILATION AND CURETTAGE OF UTERUS     ESOPHAGOGASTRODUODENOSCOPY N/A 02/09/2013   Procedure: ESOPHAGOGASTRODUODENOSCOPY (EGD);  Surgeon: Hart Carwin, MD;  Location: [facility];  Service: Endoscopy;  Laterality: N/A;   FOOT SPURS     HYSTEROSCOPY WITH D & C N/A 09/11/2012   Procedure: DILATATION AND CURETTAGE ;  Surgeon: Zelphia Cairo, MD;  Location: [facility] ORS;  Service: Gynecology;  Laterality: N/A;   KNEE SURGERY  2007   LAPAROSCOPIC ASSISTED VAGINAL HYSTERECTOMY N/A 04/05/2016   Procedure: LAPAROSCOPIC ASSISTED VAGINAL HYSTERECTOMY possible BSO;  Surgeon: Zelphia Cairo, MD;  Location: [facility] ORS;  Service: Gynecology;  Laterality: N/A;   LUMBAR LAMINECTOMY/DECOMPRESSION MICRODISCECTOMY N/A 10/20/2014   Procedure: MICRO LUMBER DECOMPRESSION L3-4 L4-5;  Surgeon: Jene Every, MD;  Location: [facility] ORS;  Service: Orthopedics;  Laterality: N/A;    LUMBAR LAMINECTOMY/DECOMPRESSION MICRODISCECTOMY Bilateral 10/13/2015   Procedure: MICRO LUMBAR DECOMPRESSION L5 - S1 AND REDO DECOMPRESSION L4 - L5 AND REMOVAL OF FACET CYST L4 - L5 2 LEVELS;  Surgeon: Jene Every, MD;  Location: [facility] ORS;  Service: Orthopedics;  Laterality: Bilateral;   PERIPHERAL VASCULAR ATHERECTOMY Right 06/12/2021   Procedure: PERIPHERAL VASCULAR ATHERECTOMY;  Surgeon: Maeola Harman, MD;  Location: [facility] INVASIVE CV LAB;  Service: Cardiovascular;  Laterality: Right;  SFA/POP/AT   PERIPHERAL VASCULAR INTERVENTION Right 04/29/2019   Procedure: PERIPHERAL VASCULAR INTERVENTION;  Surgeon: Cephus Shelling, MD;  Location: [facility] INVASIVE CV LAB;  Service: Cardiovascular;  Laterality: Right;   PERIPHERAL VASCULAR INTERVENTION Right 06/12/2021   Procedure: PERIPHERAL VASCULAR INTERVENTION;  Surgeon: Maeola Harman, MD;  Location: [facility] INVASIVE CV LAB;  Service: Cardiovascular;  Laterality: Right;  SFP/POPLITEAL/AT  PERIPHERAL VASCULAR INTERVENTION  03/07/2022   Procedure: PERIPHERAL VASCULAR INTERVENTION;  Surgeon: Victorino Sparrow, MD;  Location: [facility] INVASIVE CV LAB;  Service: Cardiovascular;;   PORT-A-CATH REMOVAL  04/17/2011   Procedure: REMOVAL PORT-A-CATH;  Surgeon: Emelia Loron, MD;  Location: [facility];  Service: General;  Laterality: Left;   PORTACATH PLACEMENT  02/07/2011   Procedure: INSERTION PORT-A-CATH;  Surgeon: Emelia Loron, MD;  Location: [facility] ORS;  Service: General;  Laterality: N/A;   RIGHT/LEFT HEART CATH AND CORONARY ANGIOGRAPHY N/A 03/17/2020   Procedure: RIGHT/LEFT HEART CATH AND CORONARY ANGIOGRAPHY;  Surgeon: Kathleene Hazel, MD;  Location: [facility] INVASIVE CV LAB;  Service: Cardiovascular;  Laterality: N/A;   TEE WITHOUT CARDIOVERSION N/A 05/04/2020   Procedure: TRANSESOPHAGEAL ECHOCARDIOGRAM (TEE);  Surgeon: Purcell Nails, MD;  Location: [facility] OR;  Service: Open Heart Surgery;  Laterality: N/A;   UPPER GASTROINTESTINAL  ENDOSCOPY     Patient Active Problem List   Diagnosis Date Noted   Above knee amputation of left lower extremity 03/13/2021   Sinus tachycardia 03/11/2021   Elevated troponin 03/08/2021   Stroke-like symptoms 03/08/2021   Anemia of chronic disease 03/08/2021   Acute supraglottitis with epiglottitis 02/05/2021   Epiglottitis 02/05/2021   Reactive airway disease 01/20/2021   Diabetic peripheral neuropathy 12/19/2020   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   Primary insomnia 11/02/2020   Irritation of eyelid 07/26/2020   S/P aortic valve replacement with bioprosthetic valve 05/04/2020   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   Peripheral artery disease    Seronegative rheumatoid arthritis of multiple sites 04/07/2019   Scoliosis deformity of spine 02/03/2019   Osteoarthritis of hip 11/17/2018   Claudication    Chronic headaches 11/12/2017   Degeneration of lumbar intervertebral disc 06/13/2017   Hyperkalemia 06/03/2017   Stage 3b chronic kidney disease (CKD)    Mass of left adrenal gland 04/19/2017   Spinal stenosis of lumbar region 10/20/2014   Hyperlipidemia 03/16/2013   Dense breasts 12/22/2012   Anxiety state 04/16/2012   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/16/2012   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   Osteoarthritis of knee 04/16/2012   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 female breast 12/20/2010   History of breast cancer 12/05/2010   Depression 09/14/2008   GERD 09/14/2008   Type 2 diabetes mellitus with neurologic complication, with long-term current use of insulin 05/20/2008   Essential hypertension 04/17/2007   Coronary atherosclerosis 04/17/2007   Hypothyroidism 04/03/2007    REFERRING DIAG: T46.568L (ICD-10-CM) - Above knee amputation of left lower extremity   ONSET DATE: 02/02/2022 MD referral to PT   THERAPY DIAG:  Other abnormalities of gait and mobility  Unsteadiness on feet  Muscle weakness (generalized)  Rationale for  Evaluation and Treatment Rehabilitation  PERTINENT HISTORY: Left TFA, DM2, neuropathy, PVD, CKD st4, RLE stent, aortic stenosis, CAD, CABG X 2, HTN, OA, lumbar spinal stenosis, breast CA   PRECAUTIONS: Fall  SUBJECTIVE:  SUBJECTIVE STATEMENT:  She saw Chris Roura, [facility] who made changes & recommending new sockeScarlette Sliceible "smart leg"   PAIN:  Are you having pain? Yes: NPRS scale: today 0/10 and in last week 0/10 Pain location: right lumbar  Pain description: achy Aggravating factors: walking without shoes with rollator Relieving factors: sit down   OBJECTIVE: (objective measures completed at initial evaluation unless otherwise dated) COGNITION: 02/07/2022 / Eval: Overall cognitive status: Within functional limits for tasks assessed   MUSCLE LENGTH: 02/07/2022 / Rhys MartiniMaisie Fus test: Left -37 deg   POSTURE: 02/07/2022 / Eval:   rounded shoulders, forward head, increased lumbar lordosis, increased thoracic kyphosis, flexed trunk , and weight shift right   LOWER EXTREMITY ROM:   ROM P:passive  A:active Left Eval 02/07/22 Left 05/07/2022  Hip flexion     Hip extension Standing -27* excessive lordosis Standing -12*  Hip abduction     Hip adduction     Hip internal rotation     Hip external rotation     Knee flexion     Knee extension     Ankle dorsiflexion     Ankle plantarflexion     Ankle inversion     Ankle eversion      (Blank rows = not tested)     TRANSFERS: 02/07/2022 / Eval:  Sit to stand: SBA requires UE assist on armrest, prosthetic knee locked delayed in process impairing stabilization upon arising. Uses back of legs against chair & external support to stabilize 02/07/2022 / Eval:  Stand to sit: SBA uses back of legs against chair for stability. External support required & armrests on chair.     GAIT: 05/07/2022: Pt amb 200' with SBQC with intermittent touch on PT / supervision.  Pt neg ramp & curb with [facility] & HHA with pt directing how to provide assistance.   02/07/2022 / Eval: Gait pattern: step to pattern, decreased arm swing- Left, decreased step length- Right, decreased stance time- Left, circumduction- Left, Left hip hike, antalgic, lateral hip instability, trunk flexed, and abducted- Left Distance walked: 100' Assistive device utilized: Counselling psychologist with HHA (required) and Walker - 4 wheeled Level of assistance: Mod A with SBQC / HHA and SBA / verbal cues for rollator Gait velocity: 1.22 ft/sec with rollator walker   FUNCTIONAL TESTs:  05/07/2022:  [facility] PT Assessment - 05/07/22 0930       Standardized Balance Assessment   Standardized Balance Assessment Berg Balance Test;Timed Up and Go Test      Berg Balance Test   Sit to Stand Able to stand  independently using hands    Standing Unsupported Able to stand safely 2 minutes    Sitting with Back Unsupported but Feet Supported on Floor or Stool Able to sit safely and securely 2 minutes    Stand to Sit Controls descent by using hands    Transfers Able to transfer safely, definite need of hands    Standing Unsupported with Eyes Closed Able to stand 10 seconds with supervision    Standing Unsupported with Feet Together Needs help to attain position but able to stand for 30 seconds with feet together    From Standing, Reach Forward with Outstretched Arm Can reach forward >12 cm safely (5")    From Standing Position, Pick up Object from Floor Unable to pick up and needs supervision    From Standing Position, Turn to Look Behind Over each Shoulder Turn sideways only but maintains balance    Turn 360 Degrees  Needs assistance while turning    Standing Unsupported, Alternately Place Feet on Step/Stool Needs assistance to keep from falling or unable to try    Standing Unsupported, One Foot in Front Needs help to step but  can hold 15 seconds    Standing on One Leg Tries to lift leg/unable to hold 3 seconds but remains standing independently    Total Score 29    Berg comment: < 36 high risk for falls (close to 100%) 46-51 moderate (>50%)   37-45 significant (>80%) 52-55 lower (> 25%)      Timed Up and Go Test   Normal TUG (seconds) 32.19   SBQC & prosthesis   TUG Comments TUG:  Normal: >13.5 sec indicates high fall risk  Manual: > 14.5 sec indicates high fall risk  Cognitive: >15 sec indicates high fall risk              Eval (02/07/2022): Timed up and go (TUG): 28.97 sec with rollator Berg Balance Scale: 21/56  CURRENT PROSTHETIC WEAR ASSESSMENT:  05/07/2022: Pt reports wearing prosthesis all awake hours without any issues.   She donnes & doffes prosthesis independently.  Pt tolerates standing >15 min with no limb pain.  Her back pain is back to her baseline prior to amputation.    02/07/2022 / Eval: Patient is independent with: skin check, residual limb care, and prosthetic cleaning Patient is dependent with: correct ply sock adjustment, proper wear schedule/adjustment, and proper weight-bearing schedule/adjustment Donning prosthesis: Modified independence per pt report Doffing prosthesis: Modified independence per pt report Prosthetic wear tolerance: ~8 hours/day, 7 days/week (~60-70% of awake hours) Prosthetic weight bearing tolerance: 8 minutes with no c/o limb pain Residual limb condition: pt reports no issues Prosthetic description: silicon liner with suction ring suspension, ischial containment socket with flexible inner socket, non-hydraulic knee,        TODAY'S TREATMENT:                                                                                                                             DATE:  05/07/2022: Prosthetic Training with Transfemoral prosthesis: See objective data PT demo & verbal cues on bed mobility including sit <>supine via sidelying, rolling left & right and  positioning with pillows. Pt verbalized understanding.  Pt questioning wear when at beach. They have a beach w/c to access beach area.  PT recommended sunscreen on limb, not wearing prosthesis on beach as have beach w/c available and wearing prosthesis all awake hours not at beach.  Need to see prosthetist upon return to blow sand out of prosthesis. Pt verbalized understanding.  PT instructed pt in not recommending switching to Microprocessor or "smart" prosthesis as weight increase would not out weigh function.  Her vascular issues in RLE would be hampered by increased weight with this design.  She would benefit from socket revision as her socket is too large due to limb shrinkage typical in first prosthesis. Pt verbalized understanding.  04/30/2022: Prosthetic Training with Transfemoral prosthesis: PT recommended pt to see prosthetist for dynamic alignment. Pt verbalized understanding.  Pt amb 100' with SBQC with 2 LOBs requiring touch on PT hand, then able to walk 25' further with HHA. Pt verbalized how when & how to use HHA to facilitate longer distances over time prior to intermittent claudication pain limits her.   Pt neg ramp & curb with [facility] & HHA with pt directing PT as if family member. She needed PT cues for proper direction but seemed to understand.  PT reviewed elevation with RLE muscle activity 1-2 x/day for 15 min. Pt verbalized understanding.   Therapeutic Exercise: PT reviewed fitness plan / HEP to include stretches / flexibility, strength, balance (standing kicks & corner exercises) and endurance (short, medium, long walking program). Pt verbalized understanding including recommendation for ongoing.     04/23/2022: Prosthetic Training with Transfemoral prosthesis: PT instructed pt in donning with no air pocket in distal liner, proper prosthesis rotation and let air out of socket.  Pt verbalized understanding.  Pt got a power w/c from friend.  PT advised to be cautious with using  it so much it becomes habit limiting her mobility / endurance.  She wants to use power w/c to get around yard.  Pt amb 75' X 4 with SBQC working on scanning right / left and up / down with CGA. She only touched PTs hand for stabilization 2x during entire session.     04/16/2022: Prosthetic Training with Transfemoral prosthesis: 5 times sit to stand pushing off 18" chair seat with RUE and no touch to stabilize Pt amb 175' with intermittent touch LUE on PTs hand 4 times in first 150' then RLE fatigue required HHA for last 25'.  Pt amb 100' X 2 with intermittent touch with CGA. Pt neg ramp & curb with SBQC with HHA / min-modA with cues on technique. 2 reps with improved understanding.  PT demo & verbal cues on picking up item from floor. Chair behind & in front for safety. Pt able to pick up hand sanitizer 3 times with PT supervision & cues. Pt verbalized understanding including set-up to practice at home.   HOME EXERCISE PROGRAM: Access Code: [email or web address] URL: [email or web address] Date: 03/12/2022 Prepared by: Vladimir Faster  Exercises - sit to stand to sit with counter  - 2-3 x daily - 7 x weekly - 1 sets - 10 reps - 5 seconds hold - Modified Thomas Stretch  - 2-3 x daily - 7 x weekly - 1 sets - 2-3 reps - 20-30 seconds hold - wide stance head motions eyes open  - 1 x daily - 4-7 x weekly - 1 sets - 10 reps - 2 seconds hold - Feet Apart with Eyes Closed with Head Motions  - 1 x daily - 4-7 x weekly - 1 sets - 10 reps - 2 seconds hold - Wide stance on Foam Pad head movements  - 1 x daily - 4-7 x weekly - 1 sets - 10 reps - 2 seconds hold - Ankle Alphabet in Elevation  - 1 x daily - 7 x weekly - 3 sets - 1 reps - 10-15 minutes hold  Stand in front of sink with one chair back on each side of you and 3rd chair behind you:  Your prosthetic toes should be back ~1" to your right toes.  Both feet facing forward about hip width apart.   Turn in circles using cane.  When going to left lead  with left leg.  When going to right, lead with right leg.  Reach both hands over head and return both hands to counter.  Follow your hands with your eyes.  Keep weight on both legs.  Turn your upper body to place item with both hands on back of chair to each side. Stop at counter between directions.  Follow your hands with your eyes.  Keep weight on both legs. Lean to each side placing item in chair bottom with one hand (use right hand going to right & left hand going to left).  Follow your hand with your eyes.  Turn around you are facing chair that was behind you.  Make sure that your back side does not touch counter when you bend forward.  Lean forward to place an item in chair seat then stand back up straight.   Stand up & sit down using chair seat to push up. Goal is to not touch sink unless off balance.   standing Up sit at edge of chair with right foot near chair, left foot /prosthesis with heel touching. push forward to SMELL flower,  keep prosthetic foot in contact with floor.  then straight up back.  Sitting down: stand with right foot near chair & left prosthetic foot forward a little bow to SMELL to flower unlocking prosthetic knee reach with LEFT hand first to sit.    ASSESSMENT: CLINICAL IMPRESSION: Patient made significant functional gains with her prosthesis.  She appears to understand ongoing HEP.  She is wearing prosthesis all awake hours & is independent in prosthetic care.    OBJECTIVE IMPAIRMENTS: Abnormal gait, decreased activity tolerance, decreased balance, decreased endurance, decreased knowledge of condition, decreased knowledge of use of DME, decreased mobility, difficulty walking, decreased ROM, decreased strength, impaired flexibility, postural dysfunction, prosthetic dependency , and pain.    ACTIVITY LIMITATIONS: carrying, lifting, bending, sitting, standing, sleeping, stairs, transfers, and locomotion level   PARTICIPATION LIMITATIONS: meal prep, cleaning,  laundry, community activity, and yard work   PERSONAL FACTORS: Age, Fitness, Past/current experiences, Time since onset of injury/illness/exacerbation, and 3+ comorbidities: see PMH  are also affecting patient's functional outcome.    REHAB POTENTIAL: Good   CLINICAL DECISION MAKING: Evolving/moderate complexity   EVALUATION COMPLEXITY: Moderate     GOALS: Goals reviewed with patient? Yes   SHORT TERM GOALS: Target date: 04/09/2022   Patient verbalizes & demonstrates understanding of updated HEP. Baseline: SEE OBJECTIVE DATA Goal status: MET 04/09/2022 2.  Patient able to balance 2 min without UE support with supervision. Baseline: SEE OBJECTIVE DATA Goal status: MET 04/09/2022   3.  Patient ambulates 61' with [facility] & prosthesis with minA. Baseline: SEE OBJECTIVE DATA Goal status: MET 04/09/2022     LONG TERM GOALS: Target date: 05/08/2022   Patient demonstrates & verbalized understanding of prosthetic care to enable safe utilization of prosthesis. Baseline: SEE OBJECTIVE DATA Goal status:  MET 05/07/2022   Patient tolerates prosthesis wear >90% of awake hours without skin or limb pain issues. Baseline: SEE OBJECTIVE DATA Goal status: MET 05/07/2022   Berg Balance >/= 36/56 to indicate lower fall risk Baseline: improved to 29/56 from 21/56.  Minimal significant difference of 8 point change indicates decrease in overall fall risk. But score <45/56 still indicates fall risk.  Goal status: improved but not full MET 05/07/2022   Patient ambulates >200' with prosthesis & SBQC modified independently Baseline: SEE OBJECTIVE DATA Goal status:    improved but not full MET 05/07/2022 as needs supervision  Patient negotiates ramps, curbs & stairs with single rail with prosthesis & SBQC with family safely.  Baseline: SEE OBJECTIVE DATA Goal status: MET 05/07/2022   Patient verbalizes & demonstrates understanding of ongoing HEP Baseline: SEE OBJECTIVE DATA Goal status:  MET 05/07/2022    7.  Patient reports low back pain increases </= 2 increments with above activities. Baseline: SEE OBJECTIVE DATA Goal status: MET 05/07/2022   PLAN:   PT FREQUENCY: 1x/week   PT DURATION:  90 days    PLANNED INTERVENTIONS: Therapeutic exercises, Therapeutic activity, Neuromuscular re-education, Balance training, Gait training, Patient/Family education, Self Care, Stair training, Vestibular training, Prosthetic training, DME instructions, Manual therapy, Re-evaluation, and Physical performance testng.   PLAN FOR NEXT SESSION:  discharge PT.   Vladimir Faster, PT, DPT 05/07/2022, 10:12 AM

## 2022-05-24 ENCOUNTER — Telehealth: Payer: Self-pay | Admitting: Family Medicine

## 2022-05-24 NOTE — Telephone Encounter (Signed)
Pt is requesting to get a new prosthetic. She would like for it to be ordered from [facility]. Ph # [phone] Fax # [phone]

## 2022-05-24 NOTE — Telephone Encounter (Signed)
Spoke to patient and advised that provider is out of office till 06/04/22 and she is okay with waiting for him to rtn to clinic.  Will forward message to come back up then to send to provider. Dm/cma

## 2022-06-05 ENCOUNTER — Other Ambulatory Visit: Payer: Self-pay | Admitting: Cardiovascular Disease

## 2022-06-05 NOTE — Telephone Encounter (Signed)
Patient notified and will call her vascular doctor who prescribed the first one.  Dm/cma

## 2022-06-06 ENCOUNTER — Other Ambulatory Visit: Payer: Self-pay

## 2022-06-06 ENCOUNTER — Telehealth: Payer: Self-pay | Admitting: Cardiovascular Disease

## 2022-06-06 MED ORDER — AMLODIPINE BESYLATE 10 MG PO TABS: 10.0000 mg | ORAL_TABLET | Freq: Every day | ORAL | 2 refills | Status: DC

## 2022-06-06 NOTE — Telephone Encounter (Signed)
Returned Pts call. Pt was unsure of which medication she was out of. Verified medication she was out of is Amlodipine 10 mg via having her read off old bottle to me. Stated she had a few weeks left but had it refilled and the pharmacy told her husband they couldn't refill it. Pt has appointment set with Dr. Eden Emms on 10/25/22. Per last office visit with Dr. Eden Emms to continue her current medication routine, medication was refilled to get her through until next appointment. Amlodipine sent to [facility] in [place] with 2 refills. Instructed pt to call if they had any other questions before appointment.

## 2022-06-06 NOTE — Telephone Encounter (Signed)
Pt c/o medication issue:  1. Name of Medication: does not know the name   2. How are you currently taking this medication (dosage and times per day)? 1 tablet at noon  3. Are you having a reaction (difficulty breathing--STAT)? no  4. What is your medication issue? Patient states her refill of her BP medication was denied and she did not know why. Informed patient she was due for a follow up appointment and she was scheduled for October. She states she does not know the name of her BP medication and says she will go without it if it is no longer needed.

## 2022-06-07 ENCOUNTER — Telehealth: Payer: Medicare Other

## 2022-06-11 ENCOUNTER — Other Ambulatory Visit: Payer: Self-pay

## 2022-06-14 NOTE — Progress Notes (Signed)
Prescription for prosthesis sent via fax to [facility] on 06/11/22, verified successful, sent to scan center.

## 2022-06-29 ENCOUNTER — Encounter (HOSPITAL_COMMUNITY): Payer: Medicare Other

## 2022-06-29 ENCOUNTER — Ambulatory Visit: Payer: Medicare Other | Admitting: Vascular Surgery

## 2022-07-04 ENCOUNTER — Encounter: Payer: Self-pay | Admitting: Family Medicine

## 2022-07-04 ENCOUNTER — Ambulatory Visit (INDEPENDENT_AMBULATORY_CARE_PROVIDER_SITE_OTHER): Payer: Medicare Other | Admitting: Family Medicine

## 2022-07-04 VITALS — BP 126/70 | HR 71 | Temp 98.0°F | Ht 63.0 in | Wt 147.2 lb

## 2022-07-04 DIAGNOSIS — E785 Hyperlipidemia, unspecified: Secondary | ICD-10-CM

## 2022-07-04 DIAGNOSIS — Z7985 Long-term (current) use of injectable non-insulin antidiabetic drugs: Secondary | ICD-10-CM

## 2022-07-04 DIAGNOSIS — M0609 Rheumatoid arthritis without rheumatoid factor, multiple sites: Secondary | ICD-10-CM | POA: Diagnosis not present

## 2022-07-04 DIAGNOSIS — E1142 Type 2 diabetes mellitus with diabetic polyneuropathy: Secondary | ICD-10-CM

## 2022-07-04 DIAGNOSIS — N1832 Chronic kidney disease, stage 3b: Secondary | ICD-10-CM | POA: Diagnosis not present

## 2022-07-04 DIAGNOSIS — Z7984 Long term (current) use of oral hypoglycemic drugs: Secondary | ICD-10-CM

## 2022-07-04 DIAGNOSIS — I739 Peripheral vascular disease, unspecified: Secondary | ICD-10-CM | POA: Diagnosis not present

## 2022-07-04 DIAGNOSIS — M154 Erosive (osteo)arthritis: Secondary | ICD-10-CM

## 2022-07-04 DIAGNOSIS — E039 Hypothyroidism, unspecified: Secondary | ICD-10-CM | POA: Diagnosis not present

## 2022-07-04 DIAGNOSIS — Z794 Long term (current) use of insulin: Secondary | ICD-10-CM | POA: Diagnosis not present

## 2022-07-04 DIAGNOSIS — I10 Essential (primary) hypertension: Secondary | ICD-10-CM

## 2022-07-04 LAB — HEMOGLOBIN A1C: Hgb A1c MFr Bld: 8.7 % — ABNORMAL HIGH (ref 4.6–6.5)

## 2022-07-04 LAB — GLUCOSE, RANDOM: Glucose, Bld: 250 mg/dL — ABNORMAL HIGH (ref 70–99)

## 2022-07-04 MED ORDER — LANTUS SOLOSTAR 100 UNIT/ML ~~LOC~~ SOPN: 20.0000 [IU] | PEN_INJECTOR | Freq: Two times a day (BID) | SUBCUTANEOUS | 3 refills | Status: DC

## 2022-07-04 NOTE — Assessment & Plan Note (Signed)
Continue golimumab (Simponi) infusions. Following with rheumatology.

## 2022-07-04 NOTE — Progress Notes (Signed)
[facility] [address] [place] [address] Dept: [phone] Dept Fax: [phone]  Chronic Care Office Visit  Subjective:    Patient ID: [name], female    DOB: 1940/05/04, 82 y.o..   MRN: [id number]  Chief Complaint  Patient presents with   Medical Management of Chronic Issues    3 month f/u.  No concerns. Fasting today.    History of Present Illness:  Patient is in today for reassessment of chronic medical issues.  [name] has a history of peripheral artery disease. 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  She now has a prosthesis. She is ambulating with either a wheeled-walker or a cane in most situations. She has severe PAD of the right leg, which is being followed by vascular surgery.    [name] has a past history of aortic stenosis and coronary artery disease. She had CABG x 2 and aortic valve replacement in 04/2020. She also has hypertension and hyperlipidemia.  She is managed on aspirin 81 mg daily, clopidogrel 75 mg daily, metoprolol tartrate 50 mg bid, and amlodipine 10 mg daily. She has been intolerant of statins and is on Repatha and Vascepa.   [name] has Type 2 diabetes.She is managed on glipizide 10 mg, semaglutide (Ozempic) 0.5 mg weekly, and insulin glargine 15 units bid. Her diabetes is complicated with peripheral neuropathy.   [name] has a history of hypothyroidism. She is managed on levothyroxine 125 mcg daily.    [name] has a history of rheumatoid arthritis and is on periodic infusions of Simponi. She also has erosive osteoarthritis of the hands. She feels she is managing with the discomfort by taking Tylenol, but thinks this may be causing her some diarrhea.  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   Primary insomnia 11/02/2020   Irritation of eyelid 07/26/2020   S/P aortic valve replacement with bioprosthetic valve 05/04/2020   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   Peripheral artery disease (HCC)    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) 04/07/2019   Scoliosis deformity of spine 02/03/2019   Osteoarthritis of hip 11/17/2018   Claudication (HCC)    Chronic headaches 11/12/2017   Degeneration of lumbar intervertebral disc 06/13/2017   Stage 3b chronic kidney disease (CKD) (HCC)    Mass of left adrenal gland (HCC) 04/19/2017   Spinal stenosis of lumbar region 10/20/2014   Hyperlipidemia 03/16/2013   Dense breasts 12/22/2012   Anxiety state 04/16/2012   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/16/2012   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   Osteoarthritis of knee 04/16/2012   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 female breast (HCC) 12/20/2010   History of breast cancer 12/05/2010   Depression 09/14/2008   GERD 09/14/2008   Type 2 diabetes mellitus with neurologic complication, with long-term current use of insulin (HCC) 05/20/2008   Essential hypertension 04/17/2007   Coronary atherosclerosis 04/17/2007   Hypothyroidism 04/03/2007   Past Surgical History:  Procedure Laterality  Date   ABDOMINAL AORTOGRAM W/LOWER EXTREMITY Bilateral 04/29/2019   Procedure: ABDOMINAL AORTOGRAM W/LOWER EXTREMITY;  Surgeon: Cephus Shelling, MD;  Location: [facility];  Service: Cardiovascular;  Laterality: Bilateral;   ABDOMINAL AORTOGRAM W/LOWER EXTREMITY Left 07/01/2019   Procedure: ABDOMINAL AORTOGRAM W/ Left LOWER  EXTREMITY [place];  Surgeon: Cephus Shelling, MD;  Location: [facility];  Service: Cardiovascular;  Laterality: Left;   ABDOMINAL AORTOGRAM W/LOWER EXTREMITY Left 03/03/2021   Procedure: ABDOMINAL AORTOGRAM W/LOWER EXTREMITY;  Surgeon: Leonie Douglas, MD;  Location: [facility];  Service: Cardiovascular;  Laterality: Left;   ABDOMINAL AORTOGRAM W/LOWER EXTREMITY Right 06/12/2021   Procedure: ABDOMINAL AORTOGRAM W/LOWER EXTREMITY;  Surgeon: Maeola Harman, MD;  Location: [facility] INVASIVE CV LAB;  Service: Cardiovascular;  Laterality: Right;  LOWER LEG   ABDOMINAL AORTOGRAM W/LOWER EXTREMITY N/A 03/07/2022   Procedure: ABDOMINAL AORTOGRAM W/LOWER EXTREMITY;  Surgeon: Victorino Sparrow, MD;  Location: [facility] INVASIVE CV LAB;  Service: Cardiovascular;  Laterality: N/A;   AMPUTATION Left 03/06/2021   Procedure: LEFT ABOVE KNEE AMPUTATION;  Surgeon: Victorino Sparrow, MD;  Location: [facility] OR;  Service: Vascular;  Laterality: Left;   ANTERIOR AND POSTERIOR REPAIR N/A 04/05/2016   Procedure: ANTERIOR (CYSTOCELE) AND POSTERIOR REPAIR (RECTOCELE);  Surgeon: Zelphia Cairo, MD;  Location: [facility];  Service: Gynecology;  Laterality: N/A;   AORTIC VALVE REPLACEMENT N/A 05/04/2020   Procedure: AORTIC VALVE REPLACEMENT (AVR) USING INSPIRIS RESILIA AORTIC VALVE;  Surgeon: Purcell Nails, MD;  Location: [facility] OR;  Service: Open Heart Surgery;  Laterality: N/A;   BREAST BIOPSY     BREAST LUMPECTOMY  1983   benign biopsy   BREAST LUMPECTOMY Right 12/29/2010   snbx, ER/PR +, Her2 -, 0/1 node pos.   CARPAL TUNNEL RELEASE Bilateral 9/12,8,12   CHOLECYSTECTOMY     COLONOSCOPY N/A 02/09/2013   Procedure: COLONOSCOPY;  Surgeon: Hart Carwin, MD;  Location: [facility];  Service: Endoscopy;  Laterality: N/A;   COLONOSCOPY     CORONARY ANGIOPLASTY     CORONARY ARTERY BYPASS GRAFT N/A 05/04/2020   Procedure: CORONARY ARTERY BYPASS GRAFTING (CABG), ON PUMP, TIMES TWO, USING LEFT INTERNAL MAMMARY ARTERY  AND RIGHT ENDOSCOPICALLY HARVESTED GREATER SAPHENOUS VEIN;  Surgeon: Purcell Nails, MD;  Location: [facility] OR;  Service: Open Heart Surgery;  Laterality: N/A;   DILATION AND CURETTAGE OF UTERUS     ESOPHAGOGASTRODUODENOSCOPY N/A 02/09/2013   Procedure: ESOPHAGOGASTRODUODENOSCOPY (EGD);  Surgeon: Hart Carwin, MD;  Location: [facility];  Service: Endoscopy;  Laterality: N/A;   FOOT SPURS     HYSTEROSCOPY WITH D & C N/A 09/11/2012   Procedure: DILATATION AND CURETTAGE ;  Surgeon: Zelphia Cairo, MD;  Location: [facility];  Service: Gynecology;  Laterality: N/A;   KNEE SURGERY  2007   LAPAROSCOPIC ASSISTED VAGINAL HYSTERECTOMY N/A 04/05/2016   Procedure: LAPAROSCOPIC ASSISTED VAGINAL HYSTERECTOMY possible BSO;  Surgeon: Zelphia Cairo, MD;  Location: [facility];  Service: Gynecology;  Laterality: N/A;   LUMBAR LAMINECTOMY/DECOMPRESSION MICRODISCECTOMY N/A 10/20/2014   Procedure: MICRO LUMBER DECOMPRESSION L3-4 L4-5;  Surgeon: Jene Every, MD;  Location: [facility];  Service: Orthopedics;  Laterality: N/A;   LUMBAR LAMINECTOMY/DECOMPRESSION MICRODISCECTOMY Bilateral 10/13/2015   Procedure: MICRO LUMBAR DECOMPRESSION L5 - S1 AND REDO DECOMPRESSION L4 - L5 AND REMOVAL OF FACET CYST L4 - L5 2 LEVELS;  Surgeon: Jene Every, MD;  Location: [facility];  Service: Orthopedics;  Laterality: Bilateral;   PERIPHERAL VASCULAR ATHERECTOMY Right 06/12/2021   Procedure: PERIPHERAL  VASCULAR ATHERECTOMY;  Surgeon: Maeola Harman, MD;  Location: [facility] INVASIVE CV LAB;  Service: Cardiovascular;  Laterality: Right;  SFA/POP/AT   PERIPHERAL VASCULAR INTERVENTION Right 04/29/2019   Procedure: PERIPHERAL VASCULAR INTERVENTION;  Surgeon: Cephus Shelling, MD;  Location: [facility];  Service: Cardiovascular;  Laterality: Right;   PERIPHERAL VASCULAR INTERVENTION Right 06/12/2021   Procedure: PERIPHERAL VASCULAR INTERVENTION;  Surgeon: Maeola Harman, MD;  Location: [facility] INVASIVE CV LAB;  Service: Cardiovascular;   Laterality: Right;  SFP/POPLITEAL/AT   PERIPHERAL VASCULAR INTERVENTION  03/07/2022   Procedure: PERIPHERAL VASCULAR INTERVENTION;  Surgeon: Victorino Sparrow, MD;  Location: [facility] INVASIVE CV LAB;  Service: Cardiovascular;;   PORT-A-CATH REMOVAL  04/17/2011   Procedure: REMOVAL PORT-A-CATH;  Surgeon: Emelia Loron, MD;  Location: [facility];  Service: General;  Laterality: Left;   PORTACATH PLACEMENT  02/07/2011   Procedure: INSERTION PORT-A-CATH;  Surgeon: Emelia Loron, MD;  Location: [facility];  Service: General;  Laterality: N/A;   RIGHT/LEFT HEART CATH AND CORONARY ANGIOGRAPHY N/A 03/17/2020   Procedure: RIGHT/LEFT HEART CATH AND CORONARY ANGIOGRAPHY;  Surgeon: Kathleene Hazel, MD;  Location: [facility];  Service: Cardiovascular;  Laterality: N/A;   TEE WITHOUT CARDIOVERSION N/A 05/04/2020   Procedure: TRANSESOPHAGEAL ECHOCARDIOGRAM (TEE);  Surgeon: Purcell Nails, MD;  Location: [facility] OR;  Service: Open Heart Surgery;  Laterality: N/A;   UPPER GASTROINTESTINAL ENDOSCOPY     Family History  Problem Relation Age of Onset   Kidney disease Mother    Heart disease Mother    Throat cancer Father    Alcoholism Father    Heart attack Father    Lymphoma Brother 57   Heart attack Brother    Diabetes Brother    Hypertension Brother    Cancer Son    Colon cancer Neg Hx    Stroke Neg Hx    Esophageal cancer Neg Hx    Rectal cancer Neg Hx    Stomach cancer Neg Hx    Outpatient Medications Prior to Visit  Medication Sig Dispense Refill   Accu-Chek FastClix Lancets MISC USE TO CHECK BLOOD SUGARS 3 TIMES DAILY 306 each 3   amLODipine (NORVASC) 10 MG tablet Take 1 tablet (10 mg total) by mouth daily. 90 tablet 2   aspirin EC 81 MG tablet Take 81 mg by mouth in the morning.     blood glucose meter kit and supplies Dispense based on patient and insurance preference. Use up to four times daily as directed. (FOR ICD-10 E10.9, E11.9). 1 each 0   Cholecalciferol (D3 5000  PO) Take 5,000 Units by mouth daily.     glipiZIDE (GLUCOTROL XL) 10 MG 24 hr tablet Take 1 tablet (10 mg total) by mouth daily with breakfast. 90 tablet 3   glucose blood (ACCU-CHEK GUIDE) test strip TEST ONCE DAILY 100 each 5   Golimumab (SIMPONI ARIA IV) Inject into the vein every 2 (two) months.     Insulin Pen Needle (CAREFINE PEN NEEDLES) 32G X 6 MM MISC 1 application by Does not apply route 2 (two) times daily. 120 each 1   LANTUS SOLOSTAR 100 UNIT/ML Solostar Pen ADMINISTER 15 UNITS UNDER THE SKIN TWICE DAILY 15 mL 3   levothyroxine (SYNTHROID) 125 MCG tablet TAKE 1 TABLET BY MOUTH DAILY 90 tablet 3   metoprolol tartrate (LOPRESSOR) 50 MG tablet Take 1 tablet (50 mg total) by mouth 2 (two) times daily. 30 tablet 0   pantoprazole (PROTONIX) 40 MG tablet TAKE  1 TABLET BY MOUTH DAILY 90 tablet 3   PARoxetine (PAXIL) 20 MG tablet TAKE 1 TABLET BY MOUTH IN THE  MORNING 90 tablet 3   REPATHA SURECLICK 140 MG/ML SOAJ INJECT 1 PEN INTO THE SKIN EVERY 14 DAYS 2 mL 11   Semaglutide,0.25 or 0.5MG /DOS, 2 MG/1.5ML SOPN Inject 0.5 mg into the skin every Tuesday. 3 mL 6   valACYclovir (VALTREX) 1000 MG tablet Take 1 tablet (1,000 mg total) by mouth daily as needed (Fever Blisters). 180 tablet 0   VASCEPA 1 g capsule Take 2 capsules (2 g total) by mouth 2 (two) times daily. 120 capsule 3   No facility-administered medications prior to visit.   Allergies  Allergen Reactions   Codeine Nausea And Vomiting and Other (See Comments)    Severe stomach cramps, also Other reaction(s): Unknown   Antihistamines, Diphenhydramine-Type Other (See Comments)    Causes hyperactivity   Diphenhydramine     Other reaction(s): Unknown   Folic Acid Other (See Comments)    Other reaction(s): tongue pain   Gabapentin     Urinary incontinence   Lyrica [Pregabalin]     Double vision/falls   Methotrexate Other (See Comments)    Other reaction(s): oral ulcers   Oxycodone     Hallucinations/crazy feelings   Rheumate  [Fish Oil]     Other reaction(s): hypoglycemia   Statins Other (See Comments)    Joint pains Other reaction(s): Unknown   Sulfa Antibiotics Other (See Comments)    Reaction not recalled   Erythromycin Hives, Rash and Other (See Comments)    Allergic due to dental work from 50 years ago. It was used in packing and resulted in rash/hives inside and outside of mouth.   Trulicity [Dulaglutide] Itching   Objective:   Today's Vitals   07/04/22 0751  BP: 126/70  Pulse: 71  Temp: 98 F (36.7 C)  TempSrc: Temporal  SpO2: 96%  Weight: 147 lb 3.2 oz (66.8 kg)  Height: 5\' 3"  (1.6 m)   Body mass index is 26.08 kg/m.   General: Well developed, well nourished. No acute distress. Extremities: Right LE warm to touch. No sign of sores or swelling. Both hands show nodular changes   and some deformity of the PIP and DIP joints. No increased warmth or redness present. Psych: Alert and oriented. Normal mood and affect.  Health Maintenance Due  Topic Date Due   DTaP/Tdap/Td (1 - Tdap) Never done   Zoster Vaccines- Shingrix (2 of 2) 03/29/2011     Assessment & Plan:   Problem List Items Addressed This Visit       Cardiovascular and Mediastinum   Essential hypertension - Primary    Diastolic BP is at goal. Continue amlodipine 10 mg daily and metoprolol tartrate 50 mg bid.      Peripheral artery disease [facility])    [name] has severe PAD. There is ongoing concern for her right leg, however, currently there is no sign of wounds or infection. We continue to focus on optimal diabetes, hypertension, and hyperlipidemia control.        Endocrine   Hypothyroidism (Chronic)    TSH has been at goal. Continue levothyroxine 125 mcg daily.      Type 2 diabetes mellitus with neurologic complication, with long-term current use of insulin (HCC)    Diabetes has been in adequate control. Continue glipizide 10 mg , semaglutide (Ozempic) 0.5 mg weekly, and insulin glargine 15 units bid.       Relevant Orders  Glucose, random   Hemoglobin A1c     Musculoskeletal and Integument   Erosive osteoarthritis of both hands    Stable. Managing discomfort with Tylenol. Continue to follow with rheumatology.      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 Following with rheumatology.        Genitourinary   Stage 3b chronic kidney disease (CKD) (HCC)    Stable. Continue focus on blood pressure control and avoidance of NSAIDs.        Other   Hyperlipidemia    Continue Repatha and Vascepa 1 gm, 2 caps bid.       Return in about 3 months (around 10/04/2022) for Reassessment.   Loyola Mast, MD

## 2022-07-04 NOTE — Assessment & Plan Note (Signed)
Continue Repatha and Vascepa 1 gm, 2 caps bid. 

## 2022-07-04 NOTE — Assessment & Plan Note (Signed)
Diastolic BP is at goal. Continue amlodipine 10 mg daily and metoprolol tartrate 50 mg bid. 

## 2022-07-04 NOTE — Assessment & Plan Note (Signed)
TSH has been at goal. Continue levothyroxine 125 mcg daily. 

## 2022-07-04 NOTE — Assessment & Plan Note (Addendum)
Stable. Continue focus on blood pressure control and avoidance of NSAIDs.

## 2022-07-04 NOTE — Assessment & Plan Note (Signed)
[name] has severe PAD. There is ongoing concern for her right leg, however, currently there is no sign of wounds or infection. We continue to focus on optimal diabetes, hypertension,a nd hyperlipidemia control. 

## 2022-07-04 NOTE — Assessment & Plan Note (Signed)
Stable. Managing discomfort with Tylenol. Continue to follow with rheumatology.

## 2022-07-04 NOTE — Assessment & Plan Note (Signed)
Diabetes has been in adequate control. Continue glipizide 10 mg , semaglutide (Ozempic) 0.5 mg weekly, and insulin glargine 15 units bid. 

## 2022-07-04 NOTE — Addendum Note (Signed)
Addended by: Loyola Mast on: 07/04/2022 04:49 PM   Modules accepted: Orders

## 2022-07-05 NOTE — Progress Notes (Signed)
[facility] [place] HISTORY AND PHYSICAL   History of Present Illness:  Patient is a 82 y.o. year old female well-known to our office, who presents status post balloon angioplasty of in-stent restenosis involving the right superficial femoral artery, popliteal artery, anterior tibial artery stents.  Has had multiple interventions, over the last year involving the right lower extremity due to stent occlusion.  On exam today, she was doing well, accompanied by her husband.  Recent return from [facility] trip with her girlfriends.  She has done well since her most recent recanalization.  She denies right lower extremity rest pain, no wounds.  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  Patient continues to ambulate with a walker, and uses a left-sided above-knee prosthesis.  She remains on aspirin Plavix does have a severe allergy to statins now on Repatha.   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, inv mammary, in situ,ER/PR +,HER2 -   Cancer (HCC)    Carcinoma of breast treated with adjuvant chemotherapy (HCC)    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HCC)    Claudication (HCC)    Coronary artery disease    stent 2007   Depression    Diabetes mellitus    Difficulty sleeping    Diverticulosis 12/10/03   Elevated cholesterol    Generalized weakness    GERD (gastroesophageal reflux disease)    Heart murmur    Hepatitis    as an infant   HSV-1 (herpes simplex virus 1) infection    Hyperlipidemia    Hypertension    Hypothyroidism    Osteoarthritis    PAD (peripheral artery disease) (HCC)    Personal history of chemotherapy    Personal history of radiation therapy    Pneumonia    2014 september and March 2022   Rheumatoid arthritis (HCC)    S/P aortic valve replacement with bioprosthetic valve  05/04/2020   Edwards Inspiris Resilia stented bovine pericardial tissue valve, size 23 mm   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   LIMA to D3, SVG to PDA, EVH via right thigh   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   Dr Charna Elizabeth   Spinal stenosis    Thyroid disease     Past Surgical History:  Procedure Laterality Date   ABDOMINAL AORTOGRAM W/LOWER EXTREMITY Bilateral 04/29/2019   Procedure: ABDOMINAL AORTOGRAM W/LOWER EXTREMITY;  Surgeon: Cephus Shelling, MD;  Location: [facility] INVASIVE CV LAB;  Service: Cardiovascular;  Laterality: Bilateral;   ABDOMINAL AORTOGRAM W/LOWER EXTREMITY Left 07/01/2019   Procedure: ABDOMINAL AORTOGRAM W/ Left LOWER EXTREMITY [place];  Surgeon: Cephus Shelling, MD;  Location: [facility] INVASIVE CV LAB;  Service: Cardiovascular;  Laterality: Left;   ABDOMINAL AORTOGRAM W/LOWER EXTREMITY Left 03/03/2021   Procedure: ABDOMINAL AORTOGRAM W/LOWER EXTREMITY;  Surgeon: Leonie Douglas, MD;  Location: [facility] INVASIVE CV LAB;  Service: Cardiovascular;  Laterality: Left;   ABDOMINAL AORTOGRAM W/LOWER EXTREMITY Right 06/12/2021   Procedure: ABDOMINAL AORTOGRAM W/LOWER EXTREMITY;  Surgeon: Maeola Harman, MD;  Location: [facility] INVASIVE CV LAB;  Service: Cardiovascular;  Laterality: Right;  LOWER LEG   ABDOMINAL AORTOGRAM W/LOWER EXTREMITY N/A 03/07/2022   Procedure: ABDOMINAL AORTOGRAM W/LOWER EXTREMITY;  Surgeon: Victorino Sparrow, MD;  Location: [facility] INVASIVE  CV LAB;  Service: Cardiovascular;  Laterality: N/A;   AMPUTATION Left 03/06/2021   Procedure: LEFT ABOVE KNEE AMPUTATION;  Surgeon: Victorino Sparrow, MD;  Location: [facility] OR;  Service: Vascular;  Laterality: Left;   ANTERIOR AND POSTERIOR REPAIR N/A 04/05/2016   Procedure: ANTERIOR (CYSTOCELE) AND POSTERIOR REPAIR (RECTOCELE);  Surgeon: Zelphia Cairo, MD;  Location: [facility] ORS;  Service: Gynecology;  Laterality: N/A;   AORTIC VALVE REPLACEMENT N/A 05/04/2020   Procedure: AORTIC VALVE REPLACEMENT (AVR) USING INSPIRIS RESILIA AORTIC VALVE;   Surgeon: Purcell Nails, MD;  Location: [facility] OR;  Service: Open Heart Surgery;  Laterality: N/A;   BREAST BIOPSY     BREAST LUMPECTOMY  1983   benign biopsy   BREAST LUMPECTOMY Right 12/29/2010   snbx, ER/PR +, Her2 -, 0/1 node pos.   CARPAL TUNNEL RELEASE Bilateral 9/12,8,12   CHOLECYSTECTOMY     COLONOSCOPY N/A 02/09/2013   Procedure: COLONOSCOPY;  Surgeon: Hart Carwin, MD;  Location: [facility] ENDOSCOPY;  Service: Endoscopy;  Laterality: N/A;   COLONOSCOPY     CORONARY ANGIOPLASTY     CORONARY ARTERY BYPASS GRAFT N/A 05/04/2020   Procedure: CORONARY ARTERY BYPASS GRAFTING (CABG), ON PUMP, TIMES TWO, USING LEFT INTERNAL MAMMARY ARTERY AND RIGHT ENDOSCOPICALLY HARVESTED GREATER SAPHENOUS VEIN;  Surgeon: Purcell Nails, MD;  Location: [facility] OR;  Service: Open Heart Surgery;  Laterality: N/A;   DILATION AND CURETTAGE OF UTERUS     ESOPHAGOGASTRODUODENOSCOPY N/A 02/09/2013   Procedure: ESOPHAGOGASTRODUODENOSCOPY (EGD);  Surgeon: Hart Carwin, MD;  Location: [facility]COPY;  Service: Endoscopy;  Laterality: N/A;   FOOT SPURS     HYSTEROSCOPY WITH D & C N/A 09/11/2012   Procedure: DILATATION AND CURETTAGE ;  Surgeon: Zelphia Cairo, MD;  Location: [facility] ORS;  Service: Gynecology;  Laterality: N/A;   KNEE SURGERY  2007   LAPAROSCOPIC ASSISTED VAGINAL HYSTERECTOMY N/A 04/05/2016   Procedure: LAPAROSCOPIC ASSISTED VAGINAL HYSTERECTOMY possible BSO;  Surgeon: Zelphia Cairo, MD;  Location: [facility] ORS;  Service: Gynecology;  Laterality: N/A;   LUMBAR LAMINECTOMY/DECOMPRESSION MICRODISCECTOMY N/A 10/20/2014   Procedure: MICRO LUMBER DECOMPRESSION L3-4 L4-5;  Surgeon: Jene Every, MD;  Location: [facility] ORS;  Service: Orthopedics;  Laterality: N/A;   LUMBAR LAMINECTOMY/DECOMPRESSION MICRODISCECTOMY Bilateral 10/13/2015   Procedure: MICRO LUMBAR DECOMPRESSION L5 - S1 AND REDO DECOMPRESSION L4 - L5 AND REMOVAL OF FACET CYST L4 - L5 2 LEVELS;  Surgeon: Jene Every, MD;  Location: [facility] ORS;  Service: Orthopedics;  Laterality:  Bilateral;   PERIPHERAL VASCULAR ATHERECTOMY Right 06/12/2021   Procedure: PERIPHERAL VASCULAR ATHERECTOMY;  Surgeon: Maeola Harman, MD;  Location: [facility] INVASIVE CV LAB;  Service: Cardiovascular;  Laterality: Right;  SFA/POP/AT   PERIPHERAL VASCULAR INTERVENTION Right 04/29/2019   Procedure: PERIPHERAL VASCULAR INTERVENTION;  Surgeon: Cephus Shelling, MD;  Location: [facility] INVASIVE CV LAB;  Service: Cardiovascular;  Laterality: Right;   PERIPHERAL VASCULAR INTERVENTION Right 06/12/2021   Procedure: PERIPHERAL VASCULAR INTERVENTION;  Surgeon: Maeola Harman, MD;  Location: [facility] INVASIVE CV LAB;  Service: Cardiovascular;  Laterality: Right;  SFP/POPLITEAL/AT   PERIPHERAL VASCULAR INTERVENTION  03/07/2022   Procedure: PERIPHERAL VASCULAR INTERVENTION;  Surgeon: Victorino Sparrow, MD;  Location: [facility] INVASIVE CV LAB;  Service: Cardiovascular;;   PORT-A-CATH REMOVAL  04/17/2011   Procedure: REMOVAL PORT-A-CATH;  Surgeon: Emelia Loron, MD;  Location: [facility];  Service: General;  Laterality: Left;   PORTACATH PLACEMENT  02/07/2011   Procedure: INSERTION PORT-A-CATH;  Surgeon: Emelia Loron, MD;  Location: [facility] ORS;  Service:  General;  Laterality: N/A;   RIGHT/LEFT HEART CATH AND CORONARY ANGIOGRAPHY N/A 03/17/2020   Procedure: RIGHT/LEFT HEART CATH AND CORONARY ANGIOGRAPHY;  Surgeon: Kathleene Hazel, MD;  Location: [facility] INVASIVE CV LAB;  Service: Cardiovascular;  Laterality: N/A;   TEE WITHOUT CARDIOVERSION N/A 05/04/2020   Procedure: TRANSESOPHAGEAL ECHOCARDIOGRAM (TEE);  Surgeon: Purcell Nails, MD;  Location: [facility] OR;  Service: Open Heart Surgery;  Laterality: N/A;   UPPER GASTROINTESTINAL ENDOSCOPY      ROS:   General:  No weight loss, Fever, chills  HEENT: No recent headaches, no nasal bleeding, no visual changes, no sore throat  Neurologic: No dizziness, blackouts, seizures. No recent symptoms of stroke or mini- stroke. No recent episodes of slurred  speech, or temporary blindness.  Cardiac: No recent episodes of chest pain/pressure, no shortness of breath at rest.  No shortness of breath with exertion.  Denies history of atrial fibrillation or irregular heartbeat  Vascular: No history of rest pain in feet.  positive history of claudication.  Positive history of non-healing ulcer, No history of DVT   Pulmonary: No home oxygen, no productive cough, no hemoptysis,  No asthma or wheezing  Musculoskeletal:  [ ]  Arthritis, [ ]  Low back pain,  [ ]  Joint pain  Hematologic:No history of hypercoagulable state.  No history of easy bleeding.  No history of anemia  Gastrointestinal: No hematochezia or melena,  No gastroesophageal reflux, no trouble swallowing  Urinary: [ ]  chronic Kidney disease, [ ]  on HD - [ ]  MWF or [ ]  TTHS, [ ]  Burning with urination, [ ]  Frequent urination, [ ]  Difficulty urinating;   Skin: No rashes  Psychological: No history of anxiety,  No history of depression  Social History Social History   Tobacco Use   Smoking status: Former    Packs/day: 2.00    Years: 40.00    Additional pack years: 0.00    Total pack years: 80.00    Types: Cigarettes    Quit date: 12/19/1997    Years since quitting: 24.5   Smokeless tobacco: Never  Vaping Use   Vaping Use: Never used  Substance Use Topics   Alcohol use: Yes    Alcohol/week: 1.0 standard drink of alcohol    Types: 1 Glasses of wine per week    Comment: occasiona - maybe once a month   Drug use: No    Family History Family History  Problem Relation Age of Onset   Kidney disease Mother    Heart disease Mother    Throat cancer Father    Alcoholism Father    Heart attack Father    Lymphoma Brother 60   Heart attack Brother    Diabetes Brother    Hypertension Brother    Cancer Son    Colon cancer Neg Hx    Stroke Neg Hx    Esophageal cancer Neg Hx    Rectal cancer Neg Hx    Stomach cancer Neg Hx     Allergies  Allergies  Allergen Reactions    Codeine Nausea And Vomiting and Other (See Comments)    Severe stomach cramps, also Other reaction(s): Unknown   Antihistamines, Diphenhydramine-Type Other (See Comments)    Causes hyperactivity   Diphenhydramine     Other reaction(s): Unknown   Folic Acid Other (See Comments)    Other reaction(s): tongue pain   Gabapentin     Urinary incontinence   Lyrica [Pregabalin]     Double vision/falls   Methotrexate Other (See Comments)  Other reaction(s): oral ulcers   Oxycodone     Hallucinations/crazy feelings   Rheumate [Fish Oil]     Other reaction(s): hypoglycemia   Statins Other (See Comments)    Joint pains Other reaction(s): Unknown   Sulfa Antibiotics Other (See Comments)    Reaction not recalled   Erythromycin Hives, Rash and Other (See Comments)    Allergic due to dental work from 50 years ago. It was used in packing and resulted in rash/hives inside and outside of mouth.   Trulicity [Dulaglutide] Itching     Current Outpatient Medications  Medication Sig Dispense Refill   Accu-Chek FastClix Lancets MISC USE TO CHECK BLOOD SUGARS 3 TIMES DAILY 306 each 3   amLODipine (NORVASC) 10 MG tablet Take 1 tablet (10 mg total) by mouth daily. 90 tablet 2   aspirin EC 81 MG tablet Take 81 mg by mouth in the morning.     blood glucose meter kit and supplies Dispense based on patient and insurance preference. Use up to four times daily as directed. (FOR ICD-10 E10.9, E11.9). 1 each 0   Cholecalciferol (D3 5000 PO) Take 5,000 Units by mouth daily.     glipiZIDE (GLUCOTROL XL) 10 MG 24 hr tablet Take 1 tablet (10 mg total) by mouth daily with breakfast. 90 tablet 3   glucose blood (ACCU-CHEK GUIDE) test strip TEST ONCE DAILY 100 each 5   Golimumab (SIMPONI ARIA IV) Inject into the vein every 2 (two) months.     insulin glargine (LANTUS SOLOSTAR) 100 UNIT/ML Solostar Pen Inject 20 Units into the skin 2 (two) times daily. 15 mL 3   Insulin Pen Needle (CAREFINE PEN NEEDLES) 32G X 6 MM  MISC 1 application by Does not apply route 2 (two) times daily. 120 each 1   levothyroxine (SYNTHROID) 125 MCG tablet TAKE 1 TABLET BY MOUTH DAILY 90 tablet 3   metoprolol tartrate (LOPRESSOR) 50 MG tablet Take 1 tablet (50 mg total) by mouth 2 (two) times daily. 30 tablet 0   pantoprazole (PROTONIX) 40 MG tablet TAKE 1 TABLET BY MOUTH DAILY 90 tablet 3   PARoxetine (PAXIL) 20 MG tablet TAKE 1 TABLET BY MOUTH IN THE  MORNING 90 tablet 3   REPATHA SURECLICK 140 MG/ML SOAJ INJECT 1 PEN INTO THE SKIN EVERY 14 DAYS 2 mL 11   Semaglutide,0.25 or 0.5MG /DOS, 2 MG/1.5ML SOPN Inject 0.5 mg into the skin every Tuesday. 3 mL 6   valACYclovir (VALTREX) 1000 MG tablet Take 1 tablet (1,000 mg total) by mouth daily as needed (Fever Blisters). 180 tablet 0   VASCEPA 1 g capsule Take 2 capsules (2 g total) by mouth 2 (two) times daily. 120 capsule 3   No current facility-administered medications for this visit.    Physical Examination  There were no vitals filed for this visit.   There is no height or weight on file to calculate BMI.  General:  Alert and oriented, no acute distress HEENT: Normal Neck: No bruit or JVD Pulmonary: Clear to auscultation bilaterally Cardiac: Regular Rate and Rhythm without murmur Abdomen: Soft, non-tender, non-distended, no mass, no scars Skin: No rash, no ischemic changes right LE Extremity Pulses:  2+ radial,  femoral pulses bilaterally Musculoskeletal: No deformity or edema, right AKA prothesis  Neurologic: Upper and lower extremity motor grossly intact and symmetric  DATA:   +-------+-----------+-----------+------------+------------+  ABI/TBIToday's ABIToday's TBIPrevious ABIPrevious TBI  +-------+-----------+-----------+------------+------------+  Right 0.77       0.43       0.92  0.48          +-------+-----------+-----------+------------+------------+  Left  BKA        BKA        BKA         BKA            +-------+-----------+-----------+------------+------------+   ASSESSMENT/PLAN:   Patient is a 82 y.o. year old female who presents for evaluation of right LE PAD.  Most recent intervention was recanalization of previously thrombosed stents extending from the superficial femoral artery through the anterior tibial artery.  This required both femoral and pedal access.  On exam, Randal was doing well.  No pain in the right lower extremity. Some neuropathy in the 1st toe.  There is a weakly palpable dorsalis pedis pulse -a change from her last visit.  Imaging was reviewed demonstrating significant decrease in right sided ABI.  She has monophasic waveforms in the anterior tibial artery distal to her stent.  As compared to previous, velocities are decreased in the anterior tibial artery.  With her significant decrease in ABI, I am concerned that [place] has threatened SFA, pop, anterior tibial artery stents.  Thrombosis of any of these would result in limb loss.  We had a long discussion regarding the above.  Patient would benefit from right lower extremity angiography with possible intervention with balloon angioplasty, possible restenting for in-stent restenosis.  After discussing the risk and benefits of the above, Alexsis elected to proceed.    Victorino Sparrow [facility] [place] Office: [phone]

## 2022-07-06 ENCOUNTER — Ambulatory Visit (HOSPITAL_COMMUNITY)
Admission: RE | Admit: 2022-07-06 | Discharge: 2022-07-06 | Disposition: A | Discharge status: Home / Self Care | Payer: Medicare Other | Source: Ambulatory Visit | Attending: Vascular Surgery | Admitting: Vascular Surgery

## 2022-07-06 ENCOUNTER — Ambulatory Visit (INDEPENDENT_AMBULATORY_CARE_PROVIDER_SITE_OTHER): Payer: Medicare Other | Admitting: Vascular Surgery

## 2022-07-06 ENCOUNTER — Encounter: Payer: Self-pay | Admitting: Vascular Surgery

## 2022-07-06 ENCOUNTER — Ambulatory Visit (INDEPENDENT_AMBULATORY_CARE_PROVIDER_SITE_OTHER)
Admission: RE | Admit: 2022-07-06 | Discharge: 2022-07-06 | Disposition: A | Discharge status: Home / Self Care | Payer: Medicare Other | Source: Ambulatory Visit | Attending: Vascular Surgery | Admitting: Vascular Surgery

## 2022-07-06 VITALS — BP 168/79 | HR 80 | Temp 97.7°F | Resp 20 | Ht 63.0 in | Wt 147.0 lb

## 2022-07-06 DIAGNOSIS — Z9889 Other specified postprocedural states: Secondary | ICD-10-CM

## 2022-07-06 DIAGNOSIS — I739 Peripheral vascular disease, unspecified: Secondary | ICD-10-CM | POA: Diagnosis present

## 2022-07-06 DIAGNOSIS — T82858A Stenosis of vascular prosthetic devices, implants and grafts, initial encounter: Secondary | ICD-10-CM

## 2022-07-06 LAB — VAS US ABI WITH/WO TBI: Right ABI: 0.77

## 2022-07-07 ENCOUNTER — Other Ambulatory Visit: Payer: Self-pay | Admitting: Vascular Surgery

## 2022-07-09 ENCOUNTER — Other Ambulatory Visit: Payer: Self-pay

## 2022-07-09 DIAGNOSIS — T82858A Stenosis of vascular prosthetic devices, implants and grafts, initial encounter: Secondary | ICD-10-CM

## 2022-07-18 ENCOUNTER — Encounter (HOSPITAL_COMMUNITY)
Admission: RE | Disposition: A | Discharge status: Home / Self Care | Payer: Self-pay | Source: Home / Self Care | Attending: Vascular Surgery

## 2022-07-18 ENCOUNTER — Ambulatory Visit (HOSPITAL_COMMUNITY)
Admission: RE | Admit: 2022-07-18 | Discharge: 2022-07-18 | Disposition: A | Discharge status: Home / Self Care | Payer: Medicare Other | Attending: Vascular Surgery | Admitting: Vascular Surgery

## 2022-07-18 ENCOUNTER — Other Ambulatory Visit: Payer: Self-pay

## 2022-07-18 DIAGNOSIS — Z66 Do not resuscitate: Secondary | ICD-10-CM | POA: Diagnosis not present

## 2022-07-18 DIAGNOSIS — Z7982 Long term (current) use of aspirin: Secondary | ICD-10-CM | POA: Insufficient documentation

## 2022-07-18 DIAGNOSIS — T82898A Other specified complication of vascular prosthetic devices, implants and grafts, initial encounter: Secondary | ICD-10-CM

## 2022-07-18 DIAGNOSIS — Z89612 Acquired absence of left leg above knee: Secondary | ICD-10-CM | POA: Insufficient documentation

## 2022-07-18 DIAGNOSIS — T82856A Stenosis of peripheral vascular stent, initial encounter: Secondary | ICD-10-CM | POA: Diagnosis not present

## 2022-07-18 DIAGNOSIS — E1122 Type 2 diabetes mellitus with diabetic chronic kidney disease: Secondary | ICD-10-CM | POA: Insufficient documentation

## 2022-07-18 DIAGNOSIS — Y832 Surgical operation with anastomosis, bypass or graft as the cause of abnormal reaction of the patient, or of later complication, without mention of misadventure at the time of the procedure: Secondary | ICD-10-CM | POA: Diagnosis not present

## 2022-07-18 DIAGNOSIS — I129 Hypertensive chronic kidney disease with stage 1 through stage 4 chronic kidney disease, or unspecified chronic kidney disease: Secondary | ICD-10-CM | POA: Diagnosis not present

## 2022-07-18 DIAGNOSIS — E1140 Type 2 diabetes mellitus with diabetic neuropathy, unspecified: Secondary | ICD-10-CM | POA: Insufficient documentation

## 2022-07-18 DIAGNOSIS — Z7902 Long term (current) use of antithrombotics/antiplatelets: Secondary | ICD-10-CM | POA: Insufficient documentation

## 2022-07-18 DIAGNOSIS — E1151 Type 2 diabetes mellitus with diabetic peripheral angiopathy without gangrene: Secondary | ICD-10-CM | POA: Diagnosis not present

## 2022-07-18 DIAGNOSIS — T82858A Stenosis of vascular prosthetic devices, implants and grafts, initial encounter: Secondary | ICD-10-CM

## 2022-07-18 DIAGNOSIS — Z87891 Personal history of nicotine dependence: Secondary | ICD-10-CM | POA: Diagnosis not present

## 2022-07-18 DIAGNOSIS — N184 Chronic kidney disease, stage 4 (severe): Secondary | ICD-10-CM | POA: Insufficient documentation

## 2022-07-18 HISTORY — PX: ABDOMINAL AORTOGRAM W/LOWER EXTREMITY: CATH118223

## 2022-07-18 HISTORY — PX: PERIPHERAL VASCULAR INTERVENTION: CATH118257

## 2022-07-18 LAB — GLUCOSE, CAPILLARY: Glucose-Capillary: 166 mg/dL — ABNORMAL HIGH (ref 70–99)

## 2022-07-18 LAB — POCT I-STAT, CHEM 8
BUN: 24 mg/dL — ABNORMAL HIGH (ref 8–23)
Calcium, Ion: 1.11 mmol/L — ABNORMAL LOW (ref 1.15–1.40)
Chloride: 107 mmol/L (ref 98–111)
Creatinine, Ser: 1.3 mg/dL — ABNORMAL HIGH (ref 0.44–1.00)
Glucose, Bld: 160 mg/dL — ABNORMAL HIGH (ref 70–99)
HCT: 39 % (ref 36.0–46.0)
Hemoglobin: 13.3 g/dL (ref 12.0–15.0)
Potassium: 3.8 mmol/L (ref 3.5–5.1)
Sodium: 138 mmol/L (ref 135–145)
TCO2: 23 mmol/L (ref 22–32)

## 2022-07-18 SURGERY — ABDOMINAL AORTOGRAM W/LOWER EXTREMITY
Anesthesia: LOCAL | Laterality: Right

## 2022-07-18 MED ORDER — MIDAZOLAM HCL 2 MG/2ML IJ SOLN
INTRAMUSCULAR | Status: DC | PRN
  Administered 2022-07-18: 1 mg via INTRAVENOUS

## 2022-07-18 MED ORDER — LABETALOL HCL 5 MG/ML IV SOLN: 10.0000 mg | INTRAVENOUS | Status: DC | PRN

## 2022-07-18 MED ORDER — ONDANSETRON HCL 4 MG/2ML IJ SOLN: 4.0000 mg | Freq: Four times a day (QID) | INTRAMUSCULAR | Status: DC | PRN

## 2022-07-18 MED ORDER — SODIUM CHLORIDE 0.9 % WEIGHT BASED INFUSION: 1.0000 mL/kg/h | INTRAVENOUS | Status: DC

## 2022-07-18 MED ORDER — FENTANYL CITRATE (PF) 100 MCG/2ML IJ SOLN
INTRAMUSCULAR | Status: DC | PRN
  Administered 2022-07-18: 50 ug via INTRAVENOUS

## 2022-07-18 MED ORDER — PROTAMINE SULFATE 10 MG/ML IV SOLN
INTRAVENOUS | Status: DC | PRN
  Administered 2022-07-18: 15 mg via INTRAVENOUS
  Administered 2022-07-18: 5 mg via INTRAVENOUS

## 2022-07-18 MED ORDER — CLOPIDOGREL BISULFATE 75 MG PO TABS
ORAL_TABLET | ORAL | Status: AC
  Filled 2022-07-18: qty 1

## 2022-07-18 MED ORDER — IODIXANOL 320 MG/ML IV SOLN
INTRAVENOUS | Status: DC | PRN
  Administered 2022-07-18: 155 mL

## 2022-07-18 MED ORDER — LABETALOL HCL 5 MG/ML IV SOLN
INTRAVENOUS | Status: AC
  Filled 2022-07-18: qty 4

## 2022-07-18 MED ORDER — SODIUM CHLORIDE 0.9% FLUSH: 3.0000 mL | Freq: Two times a day (BID) | INTRAVENOUS | Status: DC

## 2022-07-18 MED ORDER — ACETAMINOPHEN 325 MG PO TABS: 650.0000 mg | ORAL_TABLET | ORAL | Status: DC | PRN

## 2022-07-18 MED ORDER — SODIUM CHLORIDE 0.9 % IV SOLN: INTRAVENOUS | Status: DC

## 2022-07-18 MED ORDER — HEPARIN SODIUM (PORCINE) 1000 UNIT/ML IJ SOLN
INTRAMUSCULAR | Status: AC
  Filled 2022-07-18: qty 10

## 2022-07-18 MED ORDER — HEPARIN SODIUM (PORCINE) 1000 UNIT/ML IJ SOLN
INTRAMUSCULAR | Status: DC | PRN
  Administered 2022-07-18: 7000 [IU] via INTRAVENOUS
  Administered 2022-07-18: 2000 [IU] via INTRAVENOUS

## 2022-07-18 MED ORDER — HYDRALAZINE HCL 20 MG/ML IJ SOLN: 5.0000 mg | INTRAMUSCULAR | Status: DC | PRN

## 2022-07-18 MED ORDER — SODIUM CHLORIDE 0.9 % IV SOLN: 250.0000 mL | INTRAVENOUS | Status: DC | PRN

## 2022-07-18 MED ORDER — MIDAZOLAM HCL 2 MG/2ML IJ SOLN
INTRAMUSCULAR | Status: AC
  Filled 2022-07-18: qty 2

## 2022-07-18 MED ORDER — PROTAMINE SULFATE 10 MG/ML IV SOLN
INTRAVENOUS | Status: AC
  Filled 2022-07-18: qty 5

## 2022-07-18 MED ORDER — SODIUM CHLORIDE 0.9% FLUSH: 3.0000 mL | INTRAVENOUS | Status: DC | PRN

## 2022-07-18 MED ORDER — CLOPIDOGREL BISULFATE 300 MG PO TABS
ORAL_TABLET | ORAL | Status: DC | PRN
  Administered 2022-07-18: 75 mg via ORAL

## 2022-07-18 MED ORDER — HEPARIN (PORCINE) IN NACL 1000-0.9 UT/500ML-% IV SOLN
INTRAVENOUS | Status: DC | PRN
  Administered 2022-07-18 (×2): 500 mL

## 2022-07-18 MED ORDER — LABETALOL HCL 5 MG/ML IV SOLN
INTRAVENOUS | Status: DC | PRN
  Administered 2022-07-18: 10 mg via INTRAVENOUS
  Administered 2022-07-18: 5 mg via INTRAVENOUS

## 2022-07-18 MED ORDER — LIDOCAINE HCL (PF) 1 % IJ SOLN
INTRAMUSCULAR | Status: DC | PRN
  Administered 2022-07-18: 15 mL

## 2022-07-18 MED ORDER — FENTANYL CITRATE (PF) 100 MCG/2ML IJ SOLN
INTRAMUSCULAR | Status: AC
  Filled 2022-07-18: qty 2

## 2022-07-18 MED ORDER — LIDOCAINE HCL (PF) 1 % IJ SOLN
INTRAMUSCULAR | Status: AC
  Filled 2022-07-18: qty 30

## 2022-07-18 SURGICAL SUPPLY — 27 items
BALLN STERLING OTW 2.5X150X150 (BALLOONS) ×2
BALLN STERLING OTW 2X220X150 (BALLOONS) ×2
BALLOON STERLING OTW 2X220X150 (BALLOONS) IMPLANT
BALLOON STRLNG OTW 2.5X150X150 (BALLOONS) IMPLANT
CATH OMNI FLUSH 5F 65CM (CATHETERS) IMPLANT
CATH QUICKCROSS .035X135CM (MICROCATHETER) IMPLANT
CLOSURE PERCLOSE PROSTYLE (VASCULAR PRODUCTS) IMPLANT
DCB RANGER 4.0X60 135 (BALLOONS) IMPLANT
DCB RANGER 5.0X100 135 (BALLOONS) IMPLANT
DEVICE CLOSURE MYNXGRIP 6/7F (Vascular Products) IMPLANT
GLIDEWIRE ADV .035X260CM (WIRE) IMPLANT
KIT ENCORE 26 ADVANTAGE (KITS) IMPLANT
KIT MICROPUNCTURE NIT STIFF (SHEATH) IMPLANT
KIT PV (KITS) ×2 IMPLANT
RANGER DCB 4.0X60 135 (BALLOONS) ×2
RANGER DCB 5.0X100 135 (BALLOONS) ×2
SHEATH CATAPULT 6FR 60 (SHEATH) IMPLANT
SHEATH PINNACLE 5F 10CM (SHEATH) IMPLANT
SHEATH PINNACLE 6F 10CM (SHEATH) IMPLANT
STENT ZILVER PTX 5X60 (Permanent Stent) IMPLANT
STOPCOCK MORSE 400PSI 3WAY (MISCELLANEOUS) IMPLANT
SYR MEDRAD MARK 7 150ML (SYRINGE) ×2 IMPLANT
TRANSDUCER W/STOPCOCK (MISCELLANEOUS) ×2 IMPLANT
TRAY PV CATH (CUSTOM PROCEDURE TRAY) ×2 IMPLANT
TUBING CIL FLEX 10 FLL-RA (TUBING) IMPLANT
WIRE BENTSON .035X145CM (WIRE) IMPLANT
WIRE G V18X300CM (WIRE) IMPLANT

## 2022-07-18 NOTE — H&P (Signed)
Patient seen and examined in preop holding.  No complaints. No changes to medication history or physical exam since last seen in clinic. After discussing the risks and benefits of right leg angiogram for assisted primary patency of previous right SFA/pop/AT stenting, Kristine Linea elected to proceed.   Victorino Sparrow MD    [facility] [place] HISTORY AND PHYSICAL   History of Present Illness:  Patient is a 82 y.o. year old female well-known to our office, who presents status post balloon angioplasty of in-stent restenosis involving the right superficial femoral artery, popliteal artery, anterior tibial artery stents.  Has had multiple interventions, over the last year involving the right lower extremity due to stent occlusion.  On exam today, she was doing well, accompanied by her husband.  Recent return from [facility] trip with her girlfriends.  She has done well since her most recent recanalization.  She denies right lower extremity rest pain, no wounds.  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  Patient continues to ambulate with a walker, and uses a left-sided above-knee prosthesis.  She remains on aspirin Plavix does have a severe allergy to statins now on Repatha.   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, inv mammary, in situ,ER/PR +,HER2 -   Cancer (HCC)    Carcinoma of breast treated with adjuvant chemotherapy (HCC)    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HCC)    Claudication (HCC)    Coronary artery disease    stent 2007   Depression    Diabetes mellitus    Difficulty sleeping    Diverticulosis 12/10/03   Elevated cholesterol    Generalized weakness    GERD (gastroesophageal reflux disease)    Heart murmur    Hepatitis    as an infant   HSV-1 (herpes simplex virus 1)  infection    Hyperlipidemia    Hypertension    Hypothyroidism    Osteoarthritis    PAD (peripheral artery disease) (HCC)    Personal history of chemotherapy    Personal history of radiation therapy    Pneumonia    2014 september and March 2022   Rheumatoid arthritis (HCC)    S/P aortic valve replacement with bioprosthetic valve 05/04/2020   Edwards Inspiris Resilia stented bovine pericardial tissue valve, size 23 mm   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   LIMA to D3, SVG to PDA, EVH via right thigh   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   Dr Charna Elizabeth   Spinal stenosis    Thyroid disease     Past Surgical History:  Procedure Laterality Date   ABDOMINAL AORTOGRAM W/LOWER EXTREMITY Bilateral 04/29/2019   Procedure: ABDOMINAL AORTOGRAM W/LOWER EXTREMITY;  Surgeon: Cephus Shelling, MD;  Location: [facility] INVASIVE CV LAB;  Service: Cardiovascular;  Laterality: Bilateral;   ABDOMINAL AORTOGRAM W/LOWER EXTREMITY Left 07/01/2019   Procedure: ABDOMINAL AORTOGRAM W/ Left LOWER EXTREMITY Runoff;  Surgeon: Cephus Shelling, MD;  Location: [facility] INVASIVE CV LAB;  Service: Cardiovascular;  Laterality: Left;   ABDOMINAL AORTOGRAM W/LOWER EXTREMITY Left 03/03/2021   Procedure: ABDOMINAL AORTOGRAM W/LOWER EXTREMITY;  Surgeon: Leonie Douglas, MD;  Location: [facility] INVASIVE CV LAB;  Service: Cardiovascular;  Laterality: Left;   ABDOMINAL AORTOGRAM  W/LOWER EXTREMITY Right 06/12/2021   Procedure: ABDOMINAL AORTOGRAM W/LOWER EXTREMITY;  Surgeon: Maeola Harman, MD;  Location: [facility] INVASIVE CV LAB;  Service: Cardiovascular;  Laterality: Right;  LOWER LEG   ABDOMINAL AORTOGRAM W/LOWER EXTREMITY N/A 03/07/2022   Procedure: ABDOMINAL AORTOGRAM W/LOWER EXTREMITY;  Surgeon: Victorino Sparrow, MD;  Location: [facility] INVASIVE CV LAB;  Service: Cardiovascular;  Laterality: N/A;   AMPUTATION Left 03/06/2021   Procedure: LEFT ABOVE KNEE AMPUTATION;  Surgeon: Victorino Sparrow, MD;  Location: [facility] OR;  Service: Vascular;  Laterality:  Left;   ANTERIOR AND POSTERIOR REPAIR N/A 04/05/2016   Procedure: ANTERIOR (CYSTOCELE) AND POSTERIOR REPAIR (RECTOCELE);  Surgeon: Zelphia Cairo, MD;  Location: [facility] ORS;  Service: Gynecology;  Laterality: N/A;   AORTIC VALVE REPLACEMENT N/A 05/04/2020   Procedure: AORTIC VALVE REPLACEMENT (AVR) USING INSPIRIS RESILIA AORTIC VALVE;  Surgeon: Purcell Nails, MD;  Location: [facility] OR;  Service: Open Heart Surgery;  Laterality: N/A;   BREAST BIOPSY     BREAST LUMPECTOMY  1983   benign biopsy   BREAST LUMPECTOMY Right 12/29/2010   snbx, ER/PR +, Her2 -, 0/1 node pos.   CARPAL TUNNEL RELEASE Bilateral 9/12,8,12   CHOLECYSTECTOMY     COLONOSCOPY N/A 02/09/2013   Procedure: COLONOSCOPY;  Surgeon: Hart Carwin, MD;  Location: [facility] ENDOSCOPY;  Service: Endoscopy;  Laterality: N/A;   COLONOSCOPY     CORONARY ANGIOPLASTY     CORONARY ARTERY BYPASS GRAFT N/A 05/04/2020   Procedure: CORONARY ARTERY BYPASS GRAFTING (CABG), ON PUMP, TIMES TWO, USING LEFT INTERNAL MAMMARY ARTERY AND RIGHT ENDOSCOPICALLY HARVESTED GREATER SAPHENOUS VEIN;  Surgeon: Purcell Nails, MD;  Location: [facility] OR;  Service: Open Heart Surgery;  Laterality: N/A;   DILATION AND CURETTAGE OF UTERUS     ESOPHAGOGASTRODUODENOSCOPY N/A 02/09/2013   Procedure: ESOPHAGOGASTRODUODENOSCOPY (EGD);  Surgeon: Hart Carwin, MD;  Location: [facility];  Service: Endoscopy;  Laterality: N/A;   FOOT SPURS     HYSTEROSCOPY WITH D & C N/A 09/11/2012   Procedure: DILATATION AND CURETTAGE ;  Surgeon: Zelphia Cairo, MD;  Location: [facility] ORS;  Service: Gynecology;  Laterality: N/A;   KNEE SURGERY  2007   LAPAROSCOPIC ASSISTED VAGINAL HYSTERECTOMY N/A 04/05/2016   Procedure: LAPAROSCOPIC ASSISTED VAGINAL HYSTERECTOMY possible BSO;  Surgeon: Zelphia Cairo, MD;  Location: [facility] ORS;  Service: Gynecology;  Laterality: N/A;   LUMBAR LAMINECTOMY/DECOMPRESSION MICRODISCECTOMY N/A 10/20/2014   Procedure: MICRO LUMBER DECOMPRESSION L3-4 L4-5;  Surgeon: Jene Every, MD;   Location: [facility] ORS;  Service: Orthopedics;  Laterality: N/A;   LUMBAR LAMINECTOMY/DECOMPRESSION MICRODISCECTOMY Bilateral 10/13/2015   Procedure: MICRO LUMBAR DECOMPRESSION L5 - S1 AND REDO DECOMPRESSION L4 - L5 AND REMOVAL OF FACET CYST L4 - L5 2 LEVELS;  Surgeon: Jene Every, MD;  Location: [facility] ORS;  Service: Orthopedics;  Laterality: Bilateral;   PERIPHERAL VASCULAR ATHERECTOMY Right 06/12/2021   Procedure: PERIPHERAL VASCULAR ATHERECTOMY;  Surgeon: Maeola Harman, MD;  Location: [facility] INVASIVE CV LAB;  Service: Cardiovascular;  Laterality: Right;  SFA/POP/AT   PERIPHERAL VASCULAR INTERVENTION Right 04/29/2019   Procedure: PERIPHERAL VASCULAR INTERVENTION;  Surgeon: Cephus Shelling, MD;  Location: [facility] INVASIVE CV LAB;  Service: Cardiovascular;  Laterality: Right;   PERIPHERAL VASCULAR INTERVENTION Right 06/12/2021   Procedure: PERIPHERAL VASCULAR INTERVENTION;  Surgeon: Maeola Harman, MD;  Location: [facility] INVASIVE CV LAB;  Service: Cardiovascular;  Laterality: Right;  SFP/POPLITEAL/AT   PERIPHERAL VASCULAR INTERVENTION  03/07/2022   Procedure: PERIPHERAL VASCULAR INTERVENTION;  Surgeon: Victorino Sparrow, MD;  Location:  [facility] INVASIVE CV LAB;  Service: Cardiovascular;;   PORT-A-CATH REMOVAL  04/17/2011   Procedure: REMOVAL PORT-A-CATH;  Surgeon: Emelia Loron, MD;  Location: [facility];  Service: General;  Laterality: Left;   PORTACATH PLACEMENT  02/07/2011   Procedure: INSERTION PORT-A-CATH;  Surgeon: Emelia Loron, MD;  Location: [facility] ORS;  Service: General;  Laterality: N/A;   RIGHT/LEFT HEART CATH AND CORONARY ANGIOGRAPHY N/A 03/17/2020   Procedure: RIGHT/LEFT HEART CATH AND CORONARY ANGIOGRAPHY;  Surgeon: Kathleene Hazel, MD;  Location: [facility] INVASIVE CV LAB;  Service: Cardiovascular;  Laterality: N/A;   TEE WITHOUT CARDIOVERSION N/A 05/04/2020   Procedure: TRANSESOPHAGEAL ECHOCARDIOGRAM (TEE);  Surgeon: Purcell Nails, MD;  Location: [facility] OR;  Service: Open  Heart Surgery;  Laterality: N/A;   UPPER GASTROINTESTINAL ENDOSCOPY      ROS:   General:  No weight loss, Fever, chills  HEENT: No recent headaches, no nasal bleeding, no visual changes, no sore throat  Neurologic: No dizziness, blackouts, seizures. No recent symptoms of stroke or mini- stroke. No recent episodes of slurred speech, or temporary blindness.  Cardiac: No recent episodes of chest pain/pressure, no shortness of breath at rest.  No shortness of breath with exertion.  Denies history of atrial fibrillation or irregular heartbeat  Vascular: No history of rest pain in feet.  positive history of claudication.  Positive history of non-healing ulcer, No history of DVT   Pulmonary: No home oxygen, no productive cough, no hemoptysis,  No asthma or wheezing  Musculoskeletal:  [ ]  Arthritis, [ ]  Low back pain,  [ ]  Joint pain  Hematologic:No history of hypercoagulable state.  No history of easy bleeding.  No history of anemia  Gastrointestinal: No hematochezia or melena,  No gastroesophageal reflux, no trouble swallowing  Urinary: [ ]  chronic Kidney disease, [ ]  on HD - [ ]  MWF or [ ]  TTHS, [ ]  Burning with urination, [ ]  Frequent urination, [ ]  Difficulty urinating;   Skin: No rashes  Psychological: No history of anxiety,  No history of depression  Social History Social History   Tobacco Use   Smoking status: Former    Packs/day: 2.00    Years: 40.00    Additional pack years: 0.00    Total pack years: 80.00    Types: Cigarettes    Quit date: 12/19/1997    Years since quitting: 24.5   Smokeless tobacco: Never  Vaping Use   Vaping Use: Never used  Substance Use Topics   Alcohol use: Yes    Alcohol/week: 1.0 standard drink of alcohol    Types: 1 Glasses of wine per week    Comment: occasiona - maybe once a month   Drug use: No    Family History Family History  Problem Relation Age of Onset   Kidney disease Mother    Heart disease Mother    Throat cancer Father     Alcoholism Father    Heart attack Father    Lymphoma Brother 60   Heart attack Brother    Diabetes Brother    Hypertension Brother    Cancer Son    Colon cancer Neg Hx    Stroke Neg Hx    Esophageal cancer Neg Hx    Rectal cancer Neg Hx    Stomach cancer Neg Hx     Allergies  Allergies  Allergen Reactions   Codeine Nausea And Vomiting and Other (See Comments)    Severe stomach cramps, also Other reaction(s): Unknown   Antihistamines, Diphenhydramine-Type  Other (See Comments)    Causes hyperactivity   Diphenhydramine     Other reaction(s): Unknown   Folic Acid Other (See Comments)    Other reaction(s): tongue pain   Gabapentin     Urinary incontinence   Lyrica [Pregabalin]     Double vision/falls   Methotrexate Other (See Comments)    Other reaction(s): oral ulcers   Oxycodone     Hallucinations/crazy feelings   Rheumate [Fish Oil]     Other reaction(s): hypoglycemia   Statins Other (See Comments)    Joint pains Other reaction(s): Unknown   Sulfa Antibiotics Other (See Comments)    Reaction not recalled   Erythromycin Hives, Rash and Other (See Comments)    Allergic due to dental work from 50 years ago. It was used in packing and resulted in rash/hives inside and outside of mouth.   Trulicity [Dulaglutide] Itching     Current Facility-Administered Medications  Medication Dose Route Frequency Provider Last Rate Last Admin   0.9 %  sodium chloride infusion   Intravenous Continuous Victorino Sparrow, MD 100 mL/hr at 07/18/22 0643 New Bag at 07/18/22 0643    Physical Examination  Vitals:   07/18/22 0723 07/18/22 0842  BP: 133/83   Pulse: 73   Resp: 16   Temp: 98.2 F (36.8 C)   TempSrc: Temporal   SpO2: 96% 97%  Weight: 65.8 kg   Height: 5\' 3"  (1.6 m)      Body mass index is 25.69 kg/m.  General:  Alert and oriented, no acute distress HEENT: Normal Neck: No bruit or JVD Pulmonary: Clear to auscultation bilaterally Cardiac: Regular Rate and  Rhythm without murmur Abdomen: Soft, non-tender, non-distended, no mass, no scars Skin: No rash, no ischemic changes right LE Extremity Pulses:  2+ radial,  femoral pulses bilaterally Musculoskeletal: No deformity or edema, right AKA prothesis  Neurologic: Upper and lower extremity motor grossly intact and symmetric  DATA:   +-------+-----------+-----------+------------+------------+  ABI/TBIToday's ABIToday's TBIPrevious ABIPrevious TBI  +-------+-----------+-----------+------------+------------+  Right 0.77       0.43       0.92        0.48          +-------+-----------+-----------+------------+------------+  Left  BKA        BKA        BKA         BKA           +-------+-----------+-----------+------------+------------+   ASSESSMENT/PLAN:   Patient is a 82 y.o. year old female who presents for evaluation of right LE PAD.  Most recent intervention was recanalization of previously thrombosed stents extending from the superficial femoral artery through the anterior tibial artery.  This required both femoral and pedal access.  On exam, Yentl was doing well.  No pain in the right lower extremity. Some neuropathy in the 1st toe.  There is a weakly palpable dorsalis pedis pulse -a change from her last visit.  Imaging was reviewed demonstrating significant decrease in right sided ABI.  She has monophasic waveforms in the anterior tibial artery distal to her stent.  As compared to previous, velocities are decreased in the anterior tibial artery.  With her significant decrease in ABI, I am concerned that Zabria has threatened SFA, pop, anterior tibial artery stents.  Thrombosis of any of these would result in limb loss.  We had a long discussion regarding the above.  Patient would benefit from right lower extremity angiography with possible intervention with balloon angioplasty, possible restenting for  in-stent restenosis.  After discussing the risk and benefits of the above,  Halle elected to proceed.    Victorino Sparrow [facility] [place] Office: [phone]

## 2022-07-18 NOTE — Op Note (Addendum)
Patient name: [name] MRN: [id number] DOB: 28-Nov-1940 Sex: female  07/18/2022 Pre-operative Diagnosis: Threatened right lower extremity SFA popliteal, anterior tibial artery stents Post-operative diagnosis:  Same Surgeon:  Victorino Sparrow, MD Procedure Performed: 1.  Ultrasound-guided micropuncture access of the left common femoral artery 2.  Aortogram 3.  Second order cannulation, right lower extremity angiogram 4.  Third order cannulation, superficial femoral artery, popliteal artery angiogram 5.  Third order cannulation, dorsalis pedis angiogram 6.  Balloon angioplasty of the right anterior tibial artery 2.5 x 150 mm 7.  Drug-coated balloon angioplasty of the anterior tibial, popliteal, SFA stents 4 x 60 mm, 5 x , 5 x Drug-eluting stent 5 x 60 mm Cook within the previous anterior tibial artery stent 8.  Device assisted closure-Mynx   Indications: Patient is a 82 year old female with previous left above-knee amputation, right-sided SFA, popliteal, anterior tibial artery stenting.  She has had multiple interventions on the right lower extremity in effort to keep the stents patent, and feed the single-vessel anterior tibial artery runoff.  She was recently seen in clinic with a decrease in ABI, as well as elevated velocities iliac stents.  I discussed the risk benefits of right lower extremity angiography in an effort to define in-stent restenosis, and improve flow for primary assisted patency of the stents, Lovelee elected to proceed  Findings:  Bilateral renal arteries small, patent, 70% stenosis in the right renal artery Widely patent infrarenal abdominal aorta.  No flow-limiting stenosis appreciated in the aortoiliac segment.  On the left, there is 60% stenosis of the left common iliac artery.  No flow-limiting stenosis or external iliac artery.  On the right: Widely patent common femoral artery.  Superficial femoral artery with stents throughout, multiple areas of  greater than 50% stenosis.  Stents extend through the popliteal artery with a lesion at P2 with greater than 50% stenosis.  The stents carry into the anterior tibial artery with a greater than 70% stenosis appreciated.  The artery occludes after roughly 5 cm with multiple collaterals and reconstitution distal anterior tibial artery which carries into the foot via the dorsalis pedis artery.   Procedure:  The patient was identified in the holding area and taken to room 8.  The patient was then placed supine on the table and prepped and draped in the usual sterile fashion.  A time out was called.  Ultrasound was used to evaluate the left common femoral artery.  It was patent .  A digital ultrasound image was acquired.  A micropuncture needle was used to access the left common femoral artery under ultrasound guidance.  An 018 wire was advanced without resistance and a micropuncture sheath was placed.  The 018 wire was removed and a benson wire was placed.  The micropuncture sheath was exchanged for a 5 french sheath.  An omniflush catheter was advanced over the wire to the level of L-1.  An abdominal angiogram was obtained.  Next, using the omniflush catheter and a benson wire, the aortic bifurcation was crossed and the catheter was placed into theright external iliac artery and right runoff was obtained.   I elected to attempt intervention to recanalize the anterior tibial artery as well as perform drug-coated balloon angioplasty of the multiple lesions appreciated within the superficial femoral artery, popliteal artery, anterior tibial artery, as well as attempt recanalization of the mid anterior tibial artery.  A 6 x 60 cm sheath was placed in the mid superficial femoral artery, and the patient  was heparinized.  Diagnostic imaging followed to map in the popliteal artery, anterior tibial artery and ensure I was not entering a collateral.  A series of wires and catheters were used to cross the multiple,  flow-limiting lesions, as well as the anterior tibial artery occlusion.  The wire was run into the dorsalis pedis.  Angiography followed to ensure that I was true lumen.  I was informed into the balloon angioplasty.  2.5 x 150 mm balloon was brought into the field and expanded along the anterior tibial artery.  Follow-up angiography demonstrated excellent result with single-vessel inline flow to the foot.  I then moved to the proximal anterior tibial artery and used a drug-coated balloon to manage the greater than 70% stenosis.  Following this, I used a drug-coated balloon in the popliteal artery, as well as the superficial femoral artery to manage the multiple lesions of in-stent restenosis from intimal hyperplasia.  Follow-up angiography demonstrated resolution of flow-limiting stenoses within the superficial femoral artery, popliteal artery stent.  There was a lesion in the proximal anterior tibial artery which was not amenable to further balloon angioplasty.  I elected to place a stent within the previous stent, and elected to use a 5 x 60 mm drug-eluting stent in an effort to limit further in-stent restenosis from neointimal hyperplasia.  Follow-up angiography demonstrated significant improvement, however the anterior tibial artery rebounded and was occluded.  The 2.5 mm balloon was continued, therefore I used a 2 mm balloon by 220 and postdilated to atmosphere of 14 which was consistent with a diameter of 2.3.  Follow-up angiography demonstrated excellent result with reestablishment of inline flow into the dorsalis pedis artery.  Initially attempted closure with a ProGlide device, which was unsuccessful x 2, and therefore moved to minx device without issue.  Impression: Successful drug-coated balloon angioplasty of multiple lesions within the superficial femoral artery, popliteal artery, anterior tibial artery stents from the abdomen hyperplasia.  Restenting of the proximal anterior tibial artery.   Recanalization of the anterior tibial artery, with completion demonstrating a single-vessel inline flow to the dorsalis pedis artery.  Shaeleigh is aware that her stents are high risk, and she will likely require further interventions to prevent stent occlusion.  She remains ambulatory using a left leg above-the-knee prosthesis.  She is also aware that she is at high risk for future limb loss due to the above.  Should she have reocclusion of the anterior tibial artery, I would plan to attempt laser atherectomy to improve patency.   Fara Olden, MD [facility] [place] Office: [phone]

## 2022-07-19 ENCOUNTER — Encounter (HOSPITAL_COMMUNITY): Payer: Self-pay | Admitting: Vascular Surgery

## 2022-07-21 ENCOUNTER — Other Ambulatory Visit: Payer: Self-pay | Admitting: Cardiovascular Disease

## 2022-07-31 ENCOUNTER — Other Ambulatory Visit: Payer: Self-pay | Admitting: *Deleted

## 2022-07-31 DIAGNOSIS — I739 Peripheral vascular disease, unspecified: Secondary | ICD-10-CM

## 2022-08-15 NOTE — Progress Notes (Signed)
HISTORY AND PHYSICAL     CC:  follow up. Requesting Provider:  Loyola Mast, MD  HPI: This is a 82 y.o. female who is here today for follow up for PAD.  Pt has hx of aortogram with balloon angioplasty of the right ATA, popliteal and SFA stents and drug eluting stent within previous ATA stent on 07/18/2022 by Dr. Karin Lieu.  (Vascular surgery hx as below)  Vascular surgery hx: -aortogram with right SFA angioplasty and stent placement 04/29/2019 by Dr. Chestine Spore for lifestyle limiting claudication.   -aortogram for LLE on 07/01/2019 (Dr. Chestine Spore) and 03/03/2021 (Dr. 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 Karin Lieu.   -aortogram with laser atherectomy of instent restenosis right SFA, occluded right popliteal and ATA, drug coated balloon angioplasty right SFA in stent restenosis and stent of right SFA and popliteal and right ATA on 06/12/2021 by Dr. Randie Heinz.   -aortogram with balloon angioplasty of previous SFA and popliteal stents and ATA and popliteal stents on 03/07/2022 by Dr. Karin Lieu.   -aortogram with balloon angioplasty of the right ATA, popliteal and SFA stents and drug eluting stent within previous ATA stent on 07/18/2022 by Dr. Karin Lieu.   The pt returns today for follow up and here with her husband and they will be married 59 years in November.  She states she has been doing well.  She denies any claudication or rest pain or non healing wounds.  She states that every now and then she can feel her stent in a couple of places below her knee but this does not bother her.   She states she is taking prescription fish oil and continues to tolerate the repatha.  She states that her sister had open heart surgery and was found to have an aneurysm at that time.  She had heart surgery 8 weeks later.  She had CT abdomen in 2019 without evidence of AAA.  She just got her prosthesis refitted.  The pt is on a statin for cholesterol management.    Repatha The pt is on an aspirin.    Other AC:  plavix The pt is on CCB, BB for hypertension.  The pt is  on medication for diabetes. 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, inv mammary, in situ,ER/PR +,HER2 -   Cancer (HCC)    Carcinoma of breast treated with adjuvant chemotherapy (HCC)    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HCC)    Claudication (HCC)    Coronary artery disease    stent 2007   Depression    Diabetes mellitus    Difficulty sleeping    Diverticulosis 12/10/03   Elevated cholesterol    Generalized weakness    GERD (gastroesophageal reflux disease)    Heart murmur    Hepatitis    as an infant   HSV-1 (herpes simplex virus 1) infection    Hyperlipidemia    Hypertension    Hypothyroidism    Osteoarthritis    PAD (peripheral artery disease) (HCC)    Personal history of chemotherapy    Personal history of radiation therapy    Pneumonia    2014 september and March 2022  Rheumatoid arthritis (HCC)    S/P aortic valve replacement with bioprosthetic valve 05/04/2020   [facility] Inspiris Resilia stented bovine pericardial tissue valve, size 23 mm   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   LIMA to D3, SVG to PDA, EVH via right thigh   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   Dr Charna Elizabeth   Spinal stenosis    Thyroid disease     Past Surgical History:  Procedure Laterality Date   ABDOMINAL AORTOGRAM W/LOWER EXTREMITY Bilateral 04/29/2019   Procedure: ABDOMINAL AORTOGRAM W/LOWER EXTREMITY;  Surgeon: Cephus Shelling, MD;  Location: [facility] INVASIVE CV LAB;  Service: Cardiovascular;  Laterality: Bilateral;   ABDOMINAL AORTOGRAM W/LOWER EXTREMITY Left 07/01/2019   Procedure: ABDOMINAL AORTOGRAM W/ Left LOWER EXTREMITY [place];  Surgeon: Cephus Shelling, MD;  Location: [facility] INVASIVE CV LAB;   Service: Cardiovascular;  Laterality: Left;   ABDOMINAL AORTOGRAM W/LOWER EXTREMITY Left 03/03/2021   Procedure: ABDOMINAL AORTOGRAM W/LOWER EXTREMITY;  Surgeon: Leonie Douglas, MD;  Location: [facility] INVASIVE CV LAB;  Service: Cardiovascular;  Laterality: Left;   ABDOMINAL AORTOGRAM W/LOWER EXTREMITY Right 06/12/2021   Procedure: ABDOMINAL AORTOGRAM W/LOWER EXTREMITY;  Surgeon: Maeola Harman, MD;  Location: [facility] INVASIVE CV LAB;  Service: Cardiovascular;  Laterality: Right;  LOWER LEG   ABDOMINAL AORTOGRAM W/LOWER EXTREMITY N/A 03/07/2022   Procedure: ABDOMINAL AORTOGRAM W/LOWER EXTREMITY;  Surgeon: Victorino Sparrow, MD;  Location: [facility] INVASIVE CV LAB;  Service: Cardiovascular;  Laterality: N/A;   ABDOMINAL AORTOGRAM W/LOWER EXTREMITY N/A 07/18/2022   Procedure: ABDOMINAL AORTOGRAM W/LOWER EXTREMITY;  Surgeon: Victorino Sparrow, MD;  Location: [facility] INVASIVE CV LAB;  Service: Cardiovascular;  Laterality: N/A;   AMPUTATION Left 03/06/2021   Procedure: LEFT ABOVE KNEE AMPUTATION;  Surgeon: Victorino Sparrow, MD;  Location: [facility] OR;  Service: Vascular;  Laterality: Left;   ANTERIOR AND POSTERIOR REPAIR N/A 04/05/2016   Procedure: ANTERIOR (CYSTOCELE) AND POSTERIOR REPAIR (RECTOCELE);  Surgeon: Zelphia Cairo, MD;  Location: [facility] ORS;  Service: Gynecology;  Laterality: N/A;   AORTIC VALVE REPLACEMENT N/A 05/04/2020   Procedure: AORTIC VALVE REPLACEMENT (AVR) USING INSPIRIS RESILIA AORTIC VALVE;  Surgeon: Purcell Nails, MD;  Location: [facility] OR;  Service: Open Heart Surgery;  Laterality: N/A;   BREAST BIOPSY     BREAST LUMPECTOMY  1983   benign biopsy   BREAST LUMPECTOMY Right 12/29/2010   snbx, ER/PR +, Her2 -, 0/1 node pos.   CARPAL TUNNEL RELEASE Bilateral 9/12,8,12   CHOLECYSTECTOMY     COLONOSCOPY N/A 02/09/2013   Procedure: COLONOSCOPY;  Surgeon: Hart Carwin, MD;  Location: [facility] ENDOSCOPY;  Service: Endoscopy;  Laterality: N/A;   COLONOSCOPY     CORONARY ANGIOPLASTY     CORONARY ARTERY BYPASS  GRAFT N/A 05/04/2020   Procedure: CORONARY ARTERY BYPASS GRAFTING (CABG), ON PUMP, TIMES TWO, USING LEFT INTERNAL MAMMARY ARTERY AND RIGHT ENDOSCOPICALLY HARVESTED GREATER SAPHENOUS VEIN;  Surgeon: Purcell Nails, MD;  Location: [facility] OR;  Service: Open Heart Surgery;  Laterality: N/A;   DILATION AND CURETTAGE OF UTERUS     ESOPHAGOGASTRODUODENOSCOPY N/A 02/09/2013   Procedure: ESOPHAGOGASTRODUODENOSCOPY (EGD);  Surgeon: Hart Carwin, MD;  Location: [facility];  Service: Endoscopy;  Laterality: N/A;   FOOT SPURS     HYSTEROSCOPY WITH D & C N/A 09/11/2012   Procedure: DILATATION AND CURETTAGE ;  Surgeon: Zelphia Cairo, MD;  Location: [facility] ORS;  Service: Gynecology;  Laterality: N/A;   KNEE SURGERY  2007   LAPAROSCOPIC ASSISTED VAGINAL HYSTERECTOMY N/A 04/05/2016  Procedure: LAPAROSCOPIC ASSISTED VAGINAL HYSTERECTOMY possible BSO;  Surgeon: Zelphia Cairo, MD;  Location: [facility] ORS;  Service: Gynecology;  Laterality: N/A;   LUMBAR LAMINECTOMY/DECOMPRESSION MICRODISCECTOMY N/A 10/20/2014   Procedure: MICRO LUMBER DECOMPRESSION L3-4 L4-5;  Surgeon: Jene Every, MD;  Location: [facility]S;  Service: Orthopedics;  Laterality: N/A;   LUMBAR LAMINECTOMY/DECOMPRESSION MICRODISCECTOMY Bilateral 10/13/2015   Procedure: MICRO LUMBAR DECOMPRESSION L5 - S1 AND REDO DECOMPRESSION L4 - L5 AND REMOVAL OF FACET CYST L4 - L5 2 LEVELS;  Surgeon: Jene Every, MD;  Location: [facility] ORS;  Service: Orthopedics;  Laterality: Bilateral;   PERIPHERAL VASCULAR ATHERECTOMY Right 06/12/2021   Procedure: PERIPHERAL VASCULAR ATHERECTOMY;  Surgeon: Maeola Harman, MD;  Location: [facility] INVASIVE CV LAB;  Service: Cardiovascular;  Laterality: Right;  SFA/POP/AT   PERIPHERAL VASCULAR INTERVENTION Right 04/29/2019   Procedure: PERIPHERAL VASCULAR INTERVENTION;  Surgeon: Cephus Shelling, MD;  Location: [facility] INVASIVE CV LAB;  Service: Cardiovascular;  Laterality: Right;   PERIPHERAL VASCULAR INTERVENTION Right 06/12/2021   Procedure:  PERIPHERAL VASCULAR INTERVENTION;  Surgeon: Maeola Harman, MD;  Location: [facility] INVASIVE CV LAB;  Service: Cardiovascular;  Laterality: Right;  SFP/POPLITEAL/AT   PERIPHERAL VASCULAR INTERVENTION  03/07/2022   Procedure: PERIPHERAL VASCULAR INTERVENTION;  Surgeon: Victorino Sparrow, MD;  Location: [facility] INVASIVE CV LAB;  Service: Cardiovascular;;   PERIPHERAL VASCULAR INTERVENTION Right 07/18/2022   Procedure: PERIPHERAL VASCULAR INTERVENTION;  Surgeon: Victorino Sparrow, MD;  Location: [facility] INVASIVE CV LAB;  Service: Cardiovascular;  Laterality: Right;   PORT-A-CATH REMOVAL  04/17/2011   Procedure: REMOVAL PORT-A-CATH;  Surgeon: Emelia Loron, MD;  Location: [facility];  Service: General;  Laterality: Left;   PORTACATH PLACEMENT  02/07/2011   Procedure: INSERTION PORT-A-CATH;  Surgeon: Emelia Loron, MD;  Location: [facility] ORS;  Service: General;  Laterality: N/A;   RIGHT/LEFT HEART CATH AND CORONARY ANGIOGRAPHY N/A 03/17/2020   Procedure: RIGHT/LEFT HEART CATH AND CORONARY ANGIOGRAPHY;  Surgeon: Kathleene Hazel, MD;  Location: [facility] INVASIVE CV LAB;  Service: Cardiovascular;  Laterality: N/A;   TEE WITHOUT CARDIOVERSION N/A 05/04/2020   Procedure: TRANSESOPHAGEAL ECHOCARDIOGRAM (TEE);  Surgeon: Purcell Nails, MD;  Location: [facility] OR;  Service: Open Heart Surgery;  Laterality: N/A;   UPPER GASTROINTESTINAL ENDOSCOPY      Allergies  Allergen Reactions   Codeine Nausea And Vomiting and Other (See Comments)    Severe stomach cramps, also Other reaction(s): Unknown   Antihistamines, Diphenhydramine-Type Other (See Comments)    Causes hyperactivity   Diphenhydramine     Other reaction(s): Unknown   Folic Acid Other (See Comments)    Other reaction(s): tongue pain   Gabapentin     Urinary incontinence   Lyrica [Pregabalin]     Double vision/falls   Methotrexate Other (See Comments)    Other reaction(s): oral ulcers   Oxycodone     Hallucinations/crazy feelings    Rheumate [Fish Oil]     Other reaction(s): hypoglycemia   Statins Other (See Comments)    Joint pains Other reaction(s): Unknown   Sulfa Antibiotics Other (See Comments)    Reaction not recalled   Erythromycin Hives, Rash and Other (See Comments)    Allergic due to dental work from 50 years ago. It was used in packing and resulted in rash/hives inside and outside of mouth.   Trulicity [Dulaglutide] Itching    Current Outpatient Medications  Medication Sig Dispense Refill   Accu-Chek FastClix Lancets MISC USE TO CHECK BLOOD SUGARS 3 TIMES DAILY 306 each 3   amLODipine (  NORVASC) 10 MG tablet Take 1 tablet (10 mg total) by mouth daily. 90 tablet 2   aspirin EC 81 MG tablet Take 81 mg by mouth in the morning.     blood glucose meter kit and supplies Dispense based on patient and insurance preference. Use up to four times daily as directed. (FOR ICD-10 E10.9, E11.9). 1 each 0   Cholecalciferol 125 MCG (5000 UT) capsule Take 5,000 Units by mouth daily.     clopidogrel (PLAVIX) 75 MG tablet TAKE 1 TABLET(75 MG) BY MOUTH DAILY 30 tablet 11   glipiZIDE (GLUCOTROL XL) 10 MG 24 hr tablet Take 1 tablet (10 mg total) by mouth daily with breakfast. 90 tablet 3   glucose blood (ACCU-CHEK GUIDE) test strip TEST ONCE DAILY 100 each 5   Golimumab (SIMPONI ARIA IV) Inject into the vein every 2 (two) months.     insulin glargine (LANTUS SOLOSTAR) 100 UNIT/ML Solostar Pen Inject 20 Units into the skin 2 (two) times daily. 15 mL 3   Insulin Pen Needle (CAREFINE PEN NEEDLES) 32G X 6 MM MISC 1 application by Does not apply route 2 (two) times daily. 120 each 1   levothyroxine (SYNTHROID) 125 MCG tablet TAKE 1 TABLET BY MOUTH DAILY 90 tablet 3   metoprolol tartrate (LOPRESSOR) 50 MG tablet TAKE 1 TABLET BY MOUTH TWICE  DAILY 180 tablet 1   pantoprazole (PROTONIX) 40 MG tablet TAKE 1 TABLET BY MOUTH DAILY 90 tablet 3   PARoxetine (PAXIL) 20 MG tablet TAKE 1 TABLET BY MOUTH IN THE  MORNING 90 tablet 3   REPATHA  SURECLICK 140 MG/ML SOAJ INJECT 1 PEN INTO THE SKIN EVERY 14 DAYS 2 mL 11   Semaglutide,0.25 or 0.5MG /DOS, 2 MG/1.5ML SOPN Inject 0.5 mg into the skin every Tuesday. 3 mL 6   valACYclovir (VALTREX) 1000 MG tablet Take 1 tablet (1,000 mg total) by mouth daily as needed (Fever Blisters). 180 tablet 0   VASCEPA 1 g capsule Take 2 capsules (2 g total) by mouth 2 (two) times daily. (Patient taking differently: Take 1 g by mouth 2 (two) times daily.) 120 capsule 3   No current facility-administered medications for this visit.    Family History  Problem Relation Age of Onset   Kidney disease Mother    Heart disease Mother    Throat cancer Father    Alcoholism Father    Heart attack Father    Lymphoma Brother 60   Heart attack Brother    Diabetes Brother    Hypertension Brother    Cancer Son    Colon cancer Neg Hx    Stroke Neg Hx    Esophageal cancer Neg Hx    Rectal cancer Neg Hx    Stomach cancer Neg Hx     Social History   Socioeconomic History   Marital status: Married    Spouse name: Not on file   Number of children: 3   Years of education: Not on file   Highest education level: Not on file  Occupational History   Occupation: retired-bookkeeping  Tobacco Use   Smoking status: Former    Current packs/day: 0.00    Average packs/day: 2.0 packs/day for 40.0 years (80.0 ttl pk-yrs)    Types: Cigarettes    Start date: 12/19/1957    Quit date: 12/19/1997    Years since quitting: 24.6   Smokeless tobacco: Never  Vaping Use   Vaping status: Never Used  Substance and Sexual Activity   Alcohol use: Yes  Alcohol/week: 1.0 standard drink of alcohol    Types: 1 Glasses of wine per week    Comment: occasiona - maybe once a month   Drug use: No   Sexual activity: Not Currently    Comment: menarch age 36, P70, HRT X 10 YRS, MENOPAUSE MID 40'S  Other Topics Concern   Not on file  Social History Narrative   One daughter died from suicide at age 37.   Social Determinants of  Health   Financial Resource Strain: Low Risk  (09/12/2021)   Overall Financial Resource Strain (CARDIA)    Difficulty of Paying Living Expenses: Not hard at all  Food Insecurity: No Food Insecurity (09/12/2021)   Hunger Vital Sign    Worried About Running Out of Food in the Last Year: Never true    Ran Out of Food in the Last Year: Never true  Transportation Needs: No Transportation Needs (09/12/2021)   PRAPARE - Administrator, Civil Service (Medical): No    Lack of Transportation (Non-Medical): No  Physical Activity: Insufficiently Active (09/12/2021)   Exercise Vital Sign    Days of Exercise per Week: 7 days    Minutes of Exercise per Session: 20 min  Stress: No Stress Concern Present (09/12/2021)   [employer] of Occupational Health - Occupational Stress Questionnaire    Feeling of Stress : Not at all  Social Connections: Moderately Isolated (09/12/2021)   Social Connection and Isolation Panel [NHANES]    Frequency of Communication with Friends and Family: Three times a week    Frequency of Social Gatherings with Friends and Family: Three times a week    Attends Religious Services: Never    Active Member of Clubs or Organizations: No    Attends Banker Meetings: Never    Marital Status: Married  Catering manager Violence: Not At Risk (09/12/2021)   Humiliation, Afraid, Rape, and Kick questionnaire    Fear of Current or Ex-Partner: No    Emotionally Abused: No    Physically Abused: No    Sexually Abused: No     REVIEW OF SYSTEMS:   [X]  denotes positive finding, [ ]  denotes negative finding Cardiac  Comments:  Chest pain or chest pressure:    Shortness of breath upon exertion:    Short of breath when lying flat:    Irregular heart rhythm:        Vascular    Pain in calf, thigh, or hip brought on by ambulation:    Pain in feet at night that wakes you up from your sleep:     Blood clot in your veins:    Leg swelling:         Pulmonary     Oxygen at home:    Productive cough:     Wheezing:         Neurologic    Sudden weakness in arms or legs:     Sudden numbness in arms or legs:     Sudden onset of difficulty speaking or slurred speech:    Temporary loss of vision in one eye:     Problems with dizziness:         Gastrointestinal    Blood in stool:     Vomited blood:         Genitourinary    Burning when urinating:     Blood in urine:        Psychiatric    Major depression:  Hematologic    Bleeding problems:    Problems with blood clotting too easily:        Skin    Rashes or ulcers:        Constitutional    Fever or chills:      PHYSICAL EXAMINATION:  Today's Vitals   08/17/22 0833  BP: 131/75  Pulse: 74  Resp: 16  Temp: 97.6 F (36.4 C)  TempSrc: Temporal  SpO2: 95%  Weight: 136 lb (61.7 kg)  Height: 5\' 3"  (1.6 m)  PainSc: 4    Body mass index is 24.09 kg/m.   General:  WDWN in NAD; vital signs documented above Gait: Not observed HENT: WNL, normocephalic Pulmonary: normal non-labored breathing , without wheezing Cardiac: regular HR, without carotid bruits Abdomen: soft, NT; aortic pulse is not palpable Skin: without rashes Vascular Exam/Pulses: Bilateral radial pulses are palpable; right popliteal pulse is not palpable; right DP pulse is easily palpable Extremities: without ischemic changes, without Gangrene , without cellulitis; without open wounds; prosthesis in place left leg Musculoskeletal: no muscle wasting or atrophy  Neurologic: A&O X 3 Psychiatric:  The pt has Normal affect.   Non-Invasive Vascular Imaging:   ABI's/TBI's on 08/17/2022: Right:  0.77/0.56 - Great toe pressure: 101 Left:  AKA   Arterial duplex on 08/17/2022: +-----------+--------+-----+--------+----------+-----------------------+  RIGHT     PSV cm/sRatioStenosisWaveform  Comments                 +-----------+--------+-----+--------+----------+-----------------------+  CFA Distal 111                   biphasic                           +-----------+--------+-----+--------+----------+-----------------------+  DFA       85                   biphasic                           +-----------+--------+-----+--------+----------+-----------------------+  SFA Prox   64                   biphasic                           +-----------+--------+-----+--------+----------+-----------------------+  SFA Mid    58                   biphasic  mid/distal post stent 1  +-----------+--------+-----+--------+----------+-----------------------+  SFA Distal 73                             stent 2 inflow           +-----------+--------+-----+--------+----------+-----------------------+  POP Prox                                  stent 2                  +-----------+--------+-----+--------+----------+-----------------------+  POP Mid                                   stent 2                  +-----------+--------+-----+--------+----------+-----------------------+  POP Distal                                stent 2                  +-----------+--------+-----+--------+----------+-----------------------+  TP Trunk                                  stent 2 outflow          +-----------+--------+-----+--------+----------+-----------------------+  ATA Prox   132                  biphasic  stent 3 distal           +-----------+--------+-----+--------+----------+-----------------------+  ATA Mid    114                  biphasic                           +-----------+--------+-----+--------+----------+-----------------------+  ATA Distal 72                                                      +-----------+--------+-----+--------+----------+-----------------------+  PTA Distal 14                   monophasic                         +-----------+--------+-----+--------+----------+-----------------------+  PERO Distal29                    monophasicretrograde               +-----------+--------+-----+--------+----------+-----------------------+     Right Stent(s):  +----------------+--------+--------+--------+--------+  PROX-MID SFA (1)PSV cm/sStenosisWaveformComments  +----------------+--------+--------+--------+--------+  Prox to Stent   103             biphasic          +----------------+--------+--------+--------+--------+  Proximal Stent  82              biphasic          +----------------+--------+--------+--------+--------+  Mid Stent       49              biphasic          +----------------+--------+--------+--------+--------+  Distal Stent    58              biphasic          +----------------+--------+--------+--------+--------+  Distal to Stent 54              biphasic          +----------------+--------+--------+--------+--------+   +------------------+--------+--------+--------+--------+  DISTAL SFA/POP (2)PSV cm/sStenosisWaveformComments  +------------------+--------+--------+--------+--------+  Prox to Stent     73              biphasic          +------------------+--------+--------+--------+--------+  Proximal Stent    68              biphasic          +------------------+--------+--------+--------+--------+  Mid Stent         81  biphasic          +------------------+--------+--------+--------+--------+  Distal Stent      44              biphasic          +------------------+--------+--------+--------+--------+  Distal to Stent   81              biphasic          +------------------+--------+--------+--------+--------+     Summary:  Right: Patent proximal SFA stent, distal SFA-popliteal stent and distal  ATA stent. Proximal ATA stent not visualized.   Previous ABI's/TBI's on 07/06/2022: Right:  0.77/0.43 - Great toe pressure: 87 Left:  BKA   Previous arterial duplex on 07/06/2022: Summary:   Right: Patent stents with no evidence of stenosis in the superficial  femoral artery and anterior tibial artery. 50-74% stenosis of the right  tibioperoneal trunk     ASSESSMENT/PLAN:: 82 y.o. female here for follow up for PAD with hx of aortogram with balloon angioplasty of the right ATA, popliteal and SFA stents and drug eluting stent within previous ATA stent on 07/18/2022 by Dr. Karin Lieu.    -pt doing well today with easily palpable right DP pulse.  Her ABI remains the same, but she did have improvement in her toe pressure and TBI.   -encouraged her to continue to stay active. -continue asa/statin/Plavix -pt will f/u in 6 months with RLE arterial duplex and ABI. -she will call sooner if she develops any non healing wounds or rest pain.    Doreatha Massed, [facility] [phone]  Clinic MD:   Edilia Bo

## 2022-08-17 ENCOUNTER — Encounter: Payer: Self-pay | Admitting: Physician Assistant

## 2022-08-17 ENCOUNTER — Ambulatory Visit (HOSPITAL_COMMUNITY)
Admission: RE | Admit: 2022-08-17 | Discharge: 2022-08-17 | Disposition: A | Discharge status: Home / Self Care | Payer: Medicare Other | Source: Ambulatory Visit | Attending: Vascular Surgery | Admitting: Vascular Surgery

## 2022-08-17 ENCOUNTER — Ambulatory Visit (HOSPITAL_COMMUNITY): Admission: RE | Admit: 2022-08-17 | Payer: Medicare Other | Source: Ambulatory Visit

## 2022-08-17 ENCOUNTER — Ambulatory Visit (INDEPENDENT_AMBULATORY_CARE_PROVIDER_SITE_OTHER): Payer: Medicare Other | Admitting: Physician Assistant

## 2022-08-17 VITALS — BP 131/75 | HR 74 | Temp 97.6°F | Resp 16 | Ht 63.0 in | Wt 136.0 lb

## 2022-08-17 DIAGNOSIS — I739 Peripheral vascular disease, unspecified: Secondary | ICD-10-CM | POA: Diagnosis not present

## 2022-08-17 DIAGNOSIS — Z9889 Other specified postprocedural states: Secondary | ICD-10-CM

## 2022-08-17 LAB — VAS US ABI WITH/WO TBI: Right ABI: 0.77

## 2022-08-22 ENCOUNTER — Other Ambulatory Visit: Payer: Self-pay

## 2022-08-22 DIAGNOSIS — I739 Peripheral vascular disease, unspecified: Secondary | ICD-10-CM

## 2022-09-07 ENCOUNTER — Ambulatory Visit (INDEPENDENT_AMBULATORY_CARE_PROVIDER_SITE_OTHER): Payer: Medicare Other

## 2022-09-07 DIAGNOSIS — Z0000 Encounter for general adult medical examination without abnormal findings: Secondary | ICD-10-CM | POA: Diagnosis not present

## 2022-09-07 NOTE — Progress Notes (Signed)
Subjective:   [name] is a 82 y.o. female who presents for Medicare Annual (Subsequent) preventive examination.  Visit Complete: Virtual  I connected with  Kristine Linea on 09/07/22 by a audio enabled telemedicine application and verified that I am speaking with the correct person using two identifiers.  Patient Location: Home  Provider Location: Office/Clinic  I discussed the limitations of evaluation and management by telemedicine. The patient expressed understanding and agreed to proceed.  Vital Signs: Unable to obtain new vitals due to this being a telehealth visit.  Review of Systems     Cardiac Risk Factors include: advanced age (>64men, >56 women);diabetes mellitus;dyslipidemia;hypertension     Objective:    Today's Vitals   There is no height or weight on file to calculate BMI.     09/07/2022   10:19 AM 07/18/2022    6:39 AM 03/07/2022    7:37 AM 02/07/2022   10:57 AM 09/12/2021   11:05 AM 07/04/2021    2:02 PM 06/12/2021    8:37 AM  Advanced Directives  Does Patient Have a Medical Advance Directive? Yes Yes Yes Yes Yes Yes Yes  Type of Estate agent of [place];Living will Healthcare Power of [place];Living will Healthcare Power of [place];Living will Healthcare Power of [place];Living will Healthcare Power of [place];Living will Healthcare Power of [place];Living will Healthcare Power of [place];Living will  Does patient want to make changes to medical advance directive?   No - Patient declined   No - Patient declined   Copy of Healthcare Power of Attorney in Chart? Yes - validated most recent copy scanned in chart (See row information)  No - copy requested No - copy requested No - copy requested No - copy requested     Current Medications (verified) Outpatient Encounter Medications as of 09/07/2022  Medication Sig   Accu-Chek FastClix Lancets MISC USE TO CHECK BLOOD SUGARS 3 TIMES DAILY   amLODipine (NORVASC) 10 MG tablet Take 1  tablet (10 mg total) by mouth daily.   aspirin EC 81 MG tablet Take 81 mg by mouth in the morning.   blood glucose meter kit and supplies Dispense based on patient and insurance preference. Use up to four times daily as directed. (FOR ICD-10 E10.9, E11.9).   Cholecalciferol 125 MCG (5000 UT) capsule Take 5,000 Units by mouth daily.   clopidogrel (PLAVIX) 75 MG tablet TAKE 1 TABLET(75 MG) BY MOUTH DAILY   glipiZIDE (GLUCOTROL XL) 10 MG 24 hr tablet Take 1 tablet (10 mg total) by mouth daily with breakfast.   glucose blood (ACCU-CHEK GUIDE) test strip TEST ONCE DAILY   Golimumab (SIMPONI ARIA IV) Inject into the vein every 2 (two) months.   insulin glargine (LANTUS SOLOSTAR) 100 UNIT/ML Solostar Pen Inject 20 Units into the skin 2 (two) times daily.   Insulin Pen Needle (CAREFINE PEN NEEDLES) 32G X 6 MM MISC 1 application by Does not apply route 2 (two) times daily.   levothyroxine (SYNTHROID) 125 MCG tablet TAKE 1 TABLET BY MOUTH DAILY   metoprolol tartrate (LOPRESSOR) 50 MG tablet TAKE 1 TABLET BY MOUTH TWICE  DAILY   pantoprazole (PROTONIX) 40 MG tablet TAKE 1 TABLET BY MOUTH DAILY   PARoxetine (PAXIL) 20 MG tablet TAKE 1 TABLET BY MOUTH IN THE  MORNING   REPATHA SURECLICK 140 MG/ML SOAJ INJECT 1 PEN INTO THE SKIN EVERY 14 DAYS   Semaglutide,0.25 or 0.5MG /DOS, 2 MG/1.5ML SOPN Inject 0.5 mg into the skin every Tuesday.   valACYclovir (VALTREX)  1000 MG tablet Take 1 tablet (1,000 mg total) by mouth daily as needed (Fever Blisters).   VASCEPA 1 g capsule Take 2 capsules (2 g total) by mouth 2 (two) times daily. (Patient not taking: Reported on 09/07/2022)   No facility-administered encounter medications on file as of 09/07/2022.    Allergies (verified) Codeine; Antihistamines, diphenhydramine-type; Diphenhydramine; Folic acid; Gabapentin; Lyrica [pregabalin]; Methotrexate; Oxycodone; Rheumate [fish oil]; Statins; Sulfa antibiotics; Erythromycin; and Trulicity [dulaglutide]   History: Past  Medical History:  Diagnosis Date   Allergy    Anemia    childhood   Anxiety    Aortic stenosis    Arterial stenosis (HCC)    mesenteric   Arthritis    Breast cancer (HCC) 12/05/10   R breast, inv mammary, in situ,ER/PR +,HER2 -   Cancer (HCC)    Carcinoma of breast treated with adjuvant chemotherapy (HCC)    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HCC)    Claudication (HCC)    Coronary artery disease    stent 2007   Depression    Diabetes mellitus    Difficulty sleeping    Diverticulosis 12/10/03   Elevated cholesterol    Generalized weakness    GERD (gastroesophageal reflux disease)    Heart murmur    Hepatitis    as an infant   HSV-1 (herpes simplex virus 1) infection    Hyperlipidemia    Hypertension    Hypothyroidism    Osteoarthritis    PAD (peripheral artery disease) (HCC)    Personal history of chemotherapy    Personal history of radiation therapy    Pneumonia    2014 september and March 2022   Rheumatoid arthritis (HCC)    S/P aortic valve replacement with bioprosthetic valve 05/04/2020   Edwards Inspiris Resilia stented bovine pericardial tissue valve, size 23 mm   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   LIMA to D3, SVG to PDA, EVH via right thigh   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   Dr Charna Elizabeth   Spinal stenosis    Thyroid disease    Past Surgical History:  Procedure Laterality Date   ABDOMINAL AORTOGRAM W/LOWER EXTREMITY Bilateral 04/29/2019   Procedure: ABDOMINAL AORTOGRAM W/LOWER EXTREMITY;  Surgeon: Cephus Shelling, MD;  Location: [facility] INVASIVE CV LAB;  Service: Cardiovascular;  Laterality: Bilateral;   ABDOMINAL AORTOGRAM W/LOWER EXTREMITY Left 07/01/2019   Procedure: ABDOMINAL AORTOGRAM W/ Left LOWER EXTREMITY [place];  Surgeon: Cephus Shelling, MD;  Location: [facility] INVASIVE CV LAB;  Service: Cardiovascular;  Laterality: Left;   ABDOMINAL AORTOGRAM W/LOWER EXTREMITY Left 03/03/2021   Procedure: ABDOMINAL AORTOGRAM W/LOWER EXTREMITY;  Surgeon: Leonie Douglas, MD;  Location: [facility] INVASIVE CV LAB;  Service: Cardiovascular;  Laterality: Left;   ABDOMINAL AORTOGRAM W/LOWER EXTREMITY Right 06/12/2021   Procedure: ABDOMINAL AORTOGRAM W/LOWER EXTREMITY;  Surgeon: Maeola Harman, MD;  Location: [facility] INVASIVE CV LAB;  Service: Cardiovascular;  Laterality: Right;  LOWER LEG   ABDOMINAL AORTOGRAM W/LOWER EXTREMITY N/A 03/07/2022   Procedure: ABDOMINAL AORTOGRAM W/LOWER EXTREMITY;  Surgeon: Victorino Sparrow, MD;  Location[facility] CV LAB;  Service: Cardiovascular;  Laterality: N/A;   ABDOMINAL AORTOGRAM W/LOWER EXTREMITY N/A 07/18/2022   Procedure: ABDOMINAL AORTOGRAM W/LOWER EXTREMITY;  Surgeon: Victorino Sparrow, MD;  Location: [facility] INVASIVE CV LAB;  Service: Cardiovascular;  Laterality: N/A;   AMPUTATION Left 03/06/2021   Procedure: LEFT ABOVE KNEE AMPUTATION;  Surgeon: Victorino Sparrow, MD;  Location: [facility] OR;  Service: Vascular;  Laterality: Left;  ANTERIOR AND POSTERIOR REPAIR N/A 04/05/2016   Procedure: ANTERIOR (CYSTOCELE) AND POSTERIOR REPAIR (RECTOCELE);  Surgeon: Zelphia Cairo, MD;  Location: [facility] ORS;  Service: Gynecology;  Laterality: N/A;   AORTIC VALVE REPLACEMENT N/A 05/04/2020   Procedure: AORTIC VALVE REPLACEMENT (AVR) USING INSPIRIS RESILIA AORTIC VALVE;  Surgeon: Purcell Nails, MD;  Location: [facility] OR;  Service: Open Heart Surgery;  Laterality: N/A;   BREAST BIOPSY     BREAST LUMPECTOMY  1983   benign biopsy   BREAST LUMPECTOMY Right 12/29/2010   snbx, ER/PR +, Her2 -, 0/1 node pos.   CARPAL TUNNEL RELEASE Bilateral 9/12,8,12   CHOLECYSTECTOMY     COLONOSCOPY N/A 02/09/2013   Procedure: COLONOSCOPY;  Surgeon: Hart Carwin, MD;  Location: [facility] ENDOSCOPY;  Service: Endoscopy;  Laterality: N/A;   COLONOSCOPY     CORONARY ANGIOPLASTY     CORONARY ARTERY BYPASS GRAFT N/A 05/04/2020   Procedure: CORONARY ARTERY BYPASS GRAFTING (CABG), ON PUMP, TIMES TWO, USING LEFT INTERNAL MAMMARY ARTERY AND RIGHT ENDOSCOPICALLY HARVESTED GREATER  SAPHENOUS VEIN;  Surgeon: Purcell Nails, MD;  Location: [facility] OR;  Service: Open Heart Surgery;  Laterality: N/A;   DILATION AND CURETTAGE OF UTERUS     ESOPHAGOGASTRODUODENOSCOPY N/A 02/09/2013   Procedure: ESOPHAGOGASTRODUODENOSCOPY (EGD);  Surgeon: Hart Carwin, MD;  Location: [facility];  Service: Endoscopy;  Laterality: N/A;   FOOT SPURS     HYSTEROSCOPY WITH D & C N/A 09/11/2012   Procedure: DILATATION AND CURETTAGE ;  Surgeon: Zelphia Cairo, MD;  Location: [facility] ORS;  Service: Gynecology;  Laterality: N/A;   KNEE SURGERY  2007   LAPAROSCOPIC ASSISTED VAGINAL HYSTERECTOMY N/A 04/05/2016   Procedure: LAPAROSCOPIC ASSISTED VAGINAL HYSTERECTOMY possible BSO;  Surgeon: Zelphia Cairo, MD;  Location: [facility] ORS;  Service: Gynecology;  Laterality: N/A;   LUMBAR LAMINECTOMY/DECOMPRESSION MICRODISCECTOMY N/A 10/20/2014   Procedure: MICRO LUMBER DECOMPRESSION L3-4 L4-5;  Surgeon: Jene Every, MD;  Location: [facility] ORS;  Service: Orthopedics;  Laterality: N/A;   LUMBAR LAMINECTOMY/DECOMPRESSION MICRODISCECTOMY Bilateral 10/13/2015   Procedure: MICRO LUMBAR DECOMPRESSION L5 - S1 AND REDO DECOMPRESSION L4 - L5 AND REMOVAL OF FACET CYST L4 - L5 2 LEVELS;  Surgeon: Jene Every, MD;  Location: [facility] ORS;  Service: Orthopedics;  Laterality: Bilateral;   PERIPHERAL VASCULAR ATHERECTOMY Right 06/12/2021   Procedure: PERIPHERAL VASCULAR ATHERECTOMY;  Surgeon: Maeola Harman, MD;  Location: [facility] INVASIVE CV LAB;  Service: Cardiovascular;  Laterality: Right;  SFA/POP/AT   PERIPHERAL VASCULAR INTERVENTION Right 04/29/2019   Procedure: PERIPHERAL VASCULAR INTERVENTION;  Surgeon: Cephus Shelling, MD;  Location: [facility] INVASIVE CV LAB;  Service: Cardiovascular;  Laterality: Right;   PERIPHERAL VASCULAR INTERVENTION Right 06/12/2021   Procedure: PERIPHERAL VASCULAR INTERVENTION;  Surgeon: Maeola Harman, MD;  Location: [facility] INVASIVE CV LAB;  Service: Cardiovascular;  Laterality: Right;  SFP/POPLITEAL/AT    PERIPHERAL VASCULAR INTERVENTION  03/07/2022   Procedure: PERIPHERAL VASCULAR INTERVENTION;  Surgeon: Victorino Sparrow, MD;  Location: [facility] INVASIVE CV LAB;  Service: Cardiovascular;;   PERIPHERAL VASCULAR INTERVENTION Right 07/18/2022   Procedure: PERIPHERAL VASCULAR INTERVENTION;  Surgeon: Victorino Sparrow, MD;  Location: [facility] INVASIVE CV LAB;  Service: Cardiovascular;  Laterality: Right;   PORT-A-CATH REMOVAL  04/17/2011   Procedure: REMOVAL PORT-A-CATH;  Surgeon: Emelia Loron, MD;  Location: [facility];  Service: General;  Laterality: Left;   PORTACATH PLACEMENT  02/07/2011   Procedure: INSERTION PORT-A-CATH;  Surgeon: Emelia Loron, MD;  Location: [facility] ORS;  Service: General;  Laterality: N/A;   RIGHT/LEFT  HEART CATH AND CORONARY ANGIOGRAPHY N/A 03/17/2020   Procedure: RIGHT/LEFT HEART CATH AND CORONARY ANGIOGRAPHY;  Surgeon: Kathleene Hazel, MD;  Location: [facility] INVASIVE CV LAB;  Service: Cardiovascular;  Laterality: N/A;   TEE WITHOUT CARDIOVERSION N/A 05/04/2020   Procedure: TRANSESOPHAGEAL ECHOCARDIOGRAM (TEE);  Surgeon: Purcell Nails, MD;  Location: [facility] OR;  Service: Open Heart Surgery;  Laterality: N/A;   UPPER GASTROINTESTINAL ENDOSCOPY     Family History  Problem Relation Age of Onset   Kidney disease Mother    Heart disease Mother    Throat cancer Father    Alcoholism Father    Heart attack Father    Lymphoma Brother 35   Heart attack Brother    Diabetes Brother    Hypertension Brother    Cancer Son    Colon cancer Neg Hx    Stroke Neg Hx    Esophageal cancer Neg Hx    Rectal cancer Neg Hx    Stomach cancer Neg Hx    Social History   Socioeconomic History   Marital status: Married    Spouse name: Not on file   Number of children: 3   Years of education: Not on file   Highest education level: Not on file  Occupational History   Occupation: retired-bookkeeping  Tobacco Use   Smoking status: Former    Current packs/day: 0.00    Average  packs/day: 2.0 packs/day for 40.0 years (80.0 ttl pk-yrs)    Types: Cigarettes    Start date: 12/19/1957    Quit date: 12/19/1997    Years since quitting: 24.7   Smokeless tobacco: Never  Vaping Use   Vaping status: Never Used  Substance and Sexual Activity   Alcohol use: Yes    Alcohol/week: 1.0 standard drink of alcohol    Types: 1 Glasses of wine per week    Comment: occasiona - maybe once a month   Drug use: No   Sexual activity: Not Currently    Comment: menarch age 63, P22, HRT X 10 YRS, MENOPAUSE MID 40'S  Other Topics Concern   Not on file  Social History Narrative   One daughter died from suicide at age 44.   Social Determinants of Health   Financial Resource Strain: Low Risk  (09/07/2022)   Overall Financial Resource Strain (CARDIA)    Difficulty of Paying Living Expenses: Not hard at all  Food Insecurity: No Food Insecurity (09/07/2022)   Hunger Vital Sign    Worried About Running Out of Food in the Last Year: Never true    Ran Out of Food in the Last Year: Never true  Transportation Needs: No Transportation Needs (09/07/2022)   PRAPARE - Administrator, Civil Service (Medical): No    Lack of Transportation (Non-Medical): No  Physical Activity: Inactive (09/07/2022)   Exercise Vital Sign    Days of Exercise per Week: 0 days    Minutes of Exercise per Session: 0 min  Stress: No Stress Concern Present (09/07/2022)   [employer] of Occupational Health - Occupational Stress Questionnaire    Feeling of Stress : Only a little  Social Connections: Moderately Isolated (09/07/2022)   Social Connection and Isolation Panel [NHANES]    Frequency of Communication with Friends and Family: More than three times a week    Frequency of Social Gatherings with Friends and Family: Three times a week    Attends Religious Services: Never    Active Member of Clubs or Organizations: No  Attends Banker Meetings: Never    Marital Status: Married     Tobacco Counseling Counseling given: Not Answered   Clinical Intake:  Pre-visit preparation completed: Yes  Pain : No/denies pain     Nutritional Risks: None Diabetes: Yes CBG done?: No Did pt. bring in CBG monitor from home?: No  How often do you need to have someone help you when you read instructions, pamphlets, or other written materials from your doctor or pharmacy?: 1 - Never  Interpreter Needed?: No  Information entered by :: NAllen LPN   Activities of Daily Living    09/07/2022   10:12 AM 09/12/2021   11:07 AM  In your present state of health, do you have any difficulty performing the following activities:  Hearing? 1 0  Comment has hearing aids   Vision? 0 0  Difficulty concentrating or making decisions? 0 0  Walking or climbing stairs? 1 1  Comment due to prosthesis   Dressing or bathing? 0 0  Doing errands, shopping? 0 1  Preparing Food and eating ? N N  Using the Toilet? N N  In the past six months, have you accidently leaked urine? Y N  Comment off and on   Do you have problems with loss of bowel control? N N  Managing your Medications? N N  Managing your Finances? N N  Housekeeping or managing your Housekeeping? N N    Patient Care Team: Loyola Mast, MD as PCP - General (Family Medicine) Wendall Stade, MD as PCP - Cardiology (Cardiology) Wendall Stade, MD as Consulting Physician (Cardiology) Maeola Harman, MD as Consulting Physician (Vascular Surgery) Gaspar Cola, [facility] (Inactive) as Pharmacist (Pharmacist) Zenovia Jordan, MD as Consulting Physician (Rheumatology) Shea Evans, [facility]) Ginette Otto, [facility]  Indicate any recent Medical Services you may have received from other than [facility] providers in the past year (date may be approximate).     Assessment:   This is a routine wellness examination for Tom.  Hearing/Vision screen Hearing Screening - Comments:: Has hearing aids with regular  maintanence Vision Screening - Comments:: Regular eye exams, Dr. Shea Evans  Dietary issues and exercise activities discussed:     Goals Addressed             This Visit's Progress    Patient Stated       09/07/2022, working on being able to walk by self       Depression Screen    09/07/2022   10:23 AM 07/04/2022    7:59 AM 04/03/2022    8:28 AM 01/01/2022    8:57 AM 10/03/2021    9:32 AM 09/12/2021   11:07 AM 09/12/2021   11:03 AM  PHQ 2/9 Scores  PHQ - 2 Score 0 0 0 0 0 0 0  PHQ- 9 Score 1 3 0 1 0      Fall Risk    09/07/2022   10:21 AM 07/04/2022    7:59 AM 09/12/2021   11:05 AM 08/04/2021    9:38 AM 05/05/2021    2:24 PM  Fall Risk   Falls in the past year? 1 1 1 1  0  Comment when first got prosthesis   Last fall at home in June 2023. No injury.   Number falls in past yr: 1 1 0 1   Injury with Fall? 0 1 0 0   Risk for fall due to : Impaired balance/gait;Impaired mobility;History of fall(s);Medication side effect History of  fall(s) Impaired balance/gait;Orthopedic patient  Impaired balance/gait  Risk for fall due to: Comment   prosthetic left leg    Follow up Falls prevention discussed;Falls evaluation completed Falls evaluation completed Falls evaluation completed;Education provided;Falls prevention discussed      MEDICARE RISK AT HOME:  Medicare Risk at Home - 09/07/22 1021     Any stairs in or around the home? Yes   has a ramp   If so, are there any without handrails? No    Home free of loose throw rugs in walkways, pet beds, electrical cords, etc? Yes    Adequate lighting in your home to reduce risk of falls? Yes    Life alert? No    Use of a cane, walker or w/c? Yes    Grab bars in the bathroom? Yes    Shower chair or bench in shower? Yes    Elevated toilet seat or a handicapped toilet? Yes             TIMED UP AND GO:  Was the test performed?  No    Cognitive Function:        09/07/2022   10:24 AM 09/12/2021   11:10 AM  6CIT Screen  What Year? 0  points 0 points  What month? 0 points 0 points  What time? 0 points 0 points  Count back from 20 0 points 0 points  Months in reverse 0 points 0 points  Repeat phrase 0 points 0 points  Total Score 0 points 0 points    Immunizations Immunization History  Administered Date(s) Administered   Fluad Quad(high Dose 65+) 11/20/2018   Influenza Split 12/07/2014   Influenza, High Dose Seasonal PF 11/26/2016   Influenza,inj,quad, With Preservative 02/22/2017, 11/20/2018   Influenza-Unspecified 10/31/2009, 12/01/2010, 12/03/2011, 11/05/2012, 11/05/2013, 10/25/2014, 10/07/2019, 11/28/2020, 10/05/2021, 10/06/2021   PFIZER(Purple Top)SARS-COV-2 Vaccination 02/12/2019, 03/05/2019, 10/24/2019   Pneumococcal Conjugate-13 07/30/2014   Pneumococcal Polysaccharide-23 11/19/2007, 11/15/2008   Pneumococcal-Unspecified 01/23/2010   Zoster Recombinant(Shingrix) 02/01/2011    TDAP status: Due, Education has been provided regarding the importance of this vaccine. Advised may receive this vaccine at local pharmacy or Health Dept. Aware to provide a copy of the vaccination record if obtained from local pharmacy or Health Dept. Verbalized acceptance and understanding.  Flu Vaccine status: Due, Education has been provided regarding the importance of this vaccine. Advised may receive this vaccine at local pharmacy or Health Dept. Aware to provide a copy of the vaccination record if obtained from local pharmacy or Health Dept. Verbalized acceptance and understanding.  Pneumococcal vaccine status: Up to date  Covid-19 vaccine status: Information provided on how to obtain vaccines.   Qualifies for Shingles Vaccine? Yes   Zostavax completed Yes   Shingrix Completed?: No.    Education has been provided regarding the importance of this vaccine. Patient has been advised to call insurance company to determine out of pocket expense if they have not yet received this vaccine. Advised may also receive vaccine at local  pharmacy or Health Dept. Verbalized acceptance and understanding.  Screening Tests Health Maintenance  Topic Date Due   DTaP/Tdap/Td (1 - Tdap) Never done   Zoster Vaccines- Shingrix (2 of 2) 03/29/2011   INFLUENZA VACCINE  08/23/2022   OPHTHALMOLOGY EXAM  09/29/2022   Diabetic kidney evaluation - eGFR measurement  01/02/2023   Diabetic kidney evaluation - Urine ACR  01/02/2023   HEMOGLOBIN A1C  01/03/2023   FOOT EXAM  04/03/2023   Medicare Annual Wellness (AWV)  09/07/2023  Pneumonia Vaccine 30+ Years old  Completed   DEXA SCAN  Completed   Hepatitis C Screening  Completed   HPV VACCINES  Aged Out   Colonoscopy  Discontinued   COVID-19 Vaccine  Discontinued    Health Maintenance  Health Maintenance Due  Topic Date Due   DTaP/Tdap/Td (1 - Tdap) Never done   Zoster Vaccines- Shingrix (2 of 2) 03/29/2011   INFLUENZA VACCINE  08/23/2022    Colorectal cancer screening: No longer required.   Mammogram status: Completed 11/2021. Repeat every year  Bone Density status: Completed 04/16/2019.   Lung Cancer Screening: (Low Dose CT Chest recommended if Age 91-80 years, 20 pack-year currently smoking OR have quit w/in 15years.) does not qualify.   Lung Cancer Screening Referral: no  Additional Screening:  Hepatitis C Screening: does qualify; Completed 04/03/2022  Vision Screening: Recommended annual ophthalmology exams for early detection of glaucoma and other disorders of the eye. Is the patient up to date with their annual eye exam?  Yes  Who is the provider or what is the name of the office in which the patient attends annual eye exams? Dr. Shea Evans If pt is not established with a provider, would they like to be referred to a provider to establish care? No .   Dental Screening: Recommended annual dental exams for proper oral hygiene  Diabetic Foot Exam: Diabetic Foot Exam: Completed 04/03/2022  Community Resource Referral / Chronic Care Management: CRR required this visit?  No    CCM required this visit?  No     Plan:     I have personally reviewed and noted the following in the patient's chart:   Medical and social history Use of alcohol, tobacco or illicit drugs  Current medications and supplements including opioid prescriptions. Patient is not currently taking opioid prescriptions. Functional ability and status Nutritional status Physical activity Advanced directives List of other physicians Hospitalizations, surgeries, and ER visits in previous 12 months Vitals Screenings to include cognitive, depression, and falls Referrals and appointments  In addition, I have reviewed and discussed with patient certain preventive protocols, quality metrics, and best practice recommendations. A written personalized care plan for preventive services as well as general preventive health recommendations were provided to patient.     Barb Merino, LPN   1/61/0960   After Visit Summary: (Pick Up) Due to this being a telephonic visit, with patients personalized plan was offered to patient and patient has requested to Pick up at office.  Nurse Notes: none

## 2022-09-07 NOTE — Patient Instructions (Signed)
[name] , Thank you for taking time to come for your Medicare Wellness Visit. I appreciate your ongoing commitment to your health goals. Please review the following plan we discussed and let me know if I can assist you in the future.   Referrals/Orders/Follow-Ups/Clinician Recommendations: none  This is a list of the screening recommended for you and due dates:  Health Maintenance  Topic Date Due   DTaP/Tdap/Td vaccine (1 - Tdap) Never done   Zoster (Shingles) Vaccine (2 of 2) 03/29/2011   Flu Shot  08/23/2022   Eye exam for diabetics  09/29/2022   Yearly kidney function blood test for diabetes  01/02/2023   Yearly kidney health urinalysis for diabetes  01/02/2023   Hemoglobin A1C  01/03/2023   Complete foot exam   04/03/2023   Medicare Annual Wellness Visit  09/07/2023   Pneumonia Vaccine  Completed   DEXA scan (bone density measurement)  Completed   Hepatitis C Screening  Completed   HPV Vaccine  Aged Out   Colon Cancer Screening  Discontinued   COVID-19 Vaccine  Discontinued    Advanced directives: (In Chart) A copy of your advanced directives are scanned into your chart should your provider ever need it.  Next Medicare Annual Wellness Visit scheduled for next year: Yes  Preventive Care 80 Years and Older, Female Preventive care refers to lifestyle choices and visits with your health care provider that can promote health and wellness. What does preventive care include? A yearly physical exam. This is also called an annual well check. Dental exams once or twice a year. Routine eye exams. Ask your health care provider how often you should have your eyes checked. Personal lifestyle choices, including: Daily care of your teeth and gums. Regular physical activity. Eating a healthy diet. Avoiding tobacco and drug use. Limiting alcohol use. Practicing safe sex. Taking low-dose aspirin every day. Taking vitamin and mineral supplements as recommended by your health care  provider. What happens during an annual well check? The services and screenings done by your health care provider during your annual well check will depend on your age, overall health, lifestyle risk factors, and family history of disease. Counseling  Your health care provider may ask you questions about your: Alcohol use. Tobacco use. Drug use. Emotional well-being. Home and relationship well-being. Sexual activity. Eating habits. History of falls. Memory and ability to understand (cognition). Work and work Astronomer. Reproductive health. Screening  You may have the following tests or measurements: Height, weight, and BMI. Blood pressure. Lipid and cholesterol levels. These may be checked every 5 years, or more frequently if you are over 65 years old. Skin check. Lung cancer screening. You may have this screening every year starting at age 36 if you have a 30-pack-year history of smoking and currently smoke or have quit within the past 15 years. Fecal occult blood test (FOBT) of the stool. You may have this test every year starting at age 24. Flexible sigmoidoscopy or colonoscopy. You may have a sigmoidoscopy every 5 years or a colonoscopy every 10 years starting at age 40. Hepatitis C blood test. Hepatitis B blood test. Sexually transmitted disease (STD) testing. Diabetes screening. This is done by checking your blood sugar (glucose) after you have not eaten for a while (fasting). You may have this done every 1-3 years. Bone density scan. This is done to screen for osteoporosis. You may have this done starting at age 62. Mammogram. This may be done every 1-2 years. Talk to your health  care provider about how often you should have regular mammograms. Talk with your health care provider about your test results, treatment options, and if necessary, the need for more tests. Vaccines  Your health care provider may recommend certain vaccines, such as: Influenza vaccine. This is  recommended every year. Tetanus, diphtheria, and acellular pertussis (Tdap, Td) vaccine. You may need a Td booster every 10 years. Zoster vaccine. You may need this after age 40. Pneumococcal 13-valent conjugate (PCV13) vaccine. One dose is recommended after age 59. Pneumococcal polysaccharide (PPSV23) vaccine. One dose is recommended after age 44. Talk to your health care provider about which screenings and vaccines you need and how often you need them. This information is not intended to replace advice given to you by your health care provider. Make sure you discuss any questions you have with your health care provider. Document Released: 02/04/2015 Document Revised: 09/28/2015 Document Reviewed: 11/09/2014 Elsevier Interactive Patient Education  2017 [employer].  Fall Prevention in the Home Falls can cause injuries. They can happen to people of all ages. There are many things you can do to make your home safe and to help prevent falls. What can I do on the outside of my home? Regularly fix the edges of walkways and driveways and fix any cracks. Remove anything that might make you trip as you walk through a door, such as a raised step or threshold. Trim any bushes or trees on the path to your home. Use bright outdoor lighting. Clear any walking paths of anything that might make someone trip, such as rocks or tools. Regularly check to see if handrails are loose or broken. Make sure that both sides of any steps have handrails. Any raised decks and porches should have guardrails on the edges. Have any leaves, snow, or ice cleared regularly. Use sand or salt on walking paths during winter. Clean up any spills in your garage right away. This includes oil or grease spills. What can I do in the bathroom? Use night lights. Install grab bars by the toilet and in the tub and shower. Do not use towel bars as grab bars. Use non-skid mats or decals in the tub or shower. If you need to sit down in  the shower, use a plastic, non-slip stool. Keep the floor dry. Clean up any water that spills on the floor as soon as it happens. Remove soap buildup in the tub or shower regularly. Attach bath mats securely with double-sided non-slip rug tape. Do not have throw rugs and other things on the floor that can make you trip. What can I do in the bedroom? Use night lights. Make sure that you have a light by your bed that is easy to reach. Do not use any sheets or blankets that are too big for your bed. They should not hang down onto the floor. Have a firm chair that has side arms. You can use this for support while you get dressed. Do not have throw rugs and other things on the floor that can make you trip. What can I do in the kitchen? Clean up any spills right away. Avoid walking on wet floors. Keep items that you use a lot in easy-to-reach places. If you need to reach something above you, use a strong step stool that has a grab bar. Keep electrical cords out of the way. Do not use floor polish or wax that makes floors slippery. If you must use wax, use non-skid floor wax. Do not have throw  rugs and other things on the floor that can make you trip. What can I do with my stairs? Do not leave any items on the stairs. Make sure that there are handrails on both sides of the stairs and use them. Fix handrails that are broken or loose. Make sure that handrails are as long as the stairways. Check any carpeting to make sure that it is firmly attached to the stairs. Fix any carpet that is loose or worn. Avoid having throw rugs at the top or bottom of the stairs. If you do have throw rugs, attach them to the floor with carpet tape. Make sure that you have a light switch at the top of the stairs and the bottom of the stairs. If you do not have them, ask someone to add them for you. What else can I do to help prevent falls? Wear shoes that: Do not have high heels. Have rubber bottoms. Are comfortable  and fit you well. Are closed at the toe. Do not wear sandals. If you use a stepladder: Make sure that it is fully opened. Do not climb a closed stepladder. Make sure that both sides of the stepladder are locked into place. Ask someone to hold it for you, if possible. Clearly mark and make sure that you can see: Any grab bars or handrails. First and last steps. Where the edge of each step is. Use tools that help you move around (mobility aids) if they are needed. These include: Canes. Walkers. Scooters. Crutches. Turn on the lights when you go into a dark area. Replace any light bulbs as soon as they burn out. Set up your furniture so you have a clear path. Avoid moving your furniture around. If any of your floors are uneven, fix them. If there are any pets around you, be aware of where they are. Review your medicines with your doctor. Some medicines can make you feel dizzy. This can increase your chance of falling. Ask your doctor what other things that you can do to help prevent falls. This information is not intended to replace advice given to you by your health care provider. Make sure you discuss any questions you have with your health care provider. Document Released: 11/04/2008 Document Revised: 06/16/2015 Document Reviewed: 02/12/2014 Elsevier Interactive Patient Education  2017 [employer].

## 2022-10-01 LAB — HM DIABETES EYE EXAM

## 2022-10-09 ENCOUNTER — Encounter: Payer: Self-pay | Admitting: Family Medicine

## 2022-10-09 ENCOUNTER — Ambulatory Visit (INDEPENDENT_AMBULATORY_CARE_PROVIDER_SITE_OTHER): Payer: Medicare Other | Admitting: Family Medicine

## 2022-10-09 VITALS — Temp 97.2°F | Ht 63.0 in | Wt 146.4 lb

## 2022-10-09 DIAGNOSIS — E1142 Type 2 diabetes mellitus with diabetic polyneuropathy: Secondary | ICD-10-CM

## 2022-10-09 DIAGNOSIS — I10 Essential (primary) hypertension: Secondary | ICD-10-CM | POA: Diagnosis not present

## 2022-10-09 DIAGNOSIS — Z794 Long term (current) use of insulin: Secondary | ICD-10-CM | POA: Diagnosis not present

## 2022-10-09 DIAGNOSIS — E039 Hypothyroidism, unspecified: Secondary | ICD-10-CM | POA: Diagnosis not present

## 2022-10-09 DIAGNOSIS — E785 Hyperlipidemia, unspecified: Secondary | ICD-10-CM

## 2022-10-09 DIAGNOSIS — Z89612 Acquired absence of left leg above knee: Secondary | ICD-10-CM

## 2022-10-09 DIAGNOSIS — S78112A Complete traumatic amputation at level between left hip and knee, initial encounter: Secondary | ICD-10-CM

## 2022-10-09 DIAGNOSIS — N3000 Acute cystitis without hematuria: Secondary | ICD-10-CM | POA: Diagnosis not present

## 2022-10-09 DIAGNOSIS — Z7984 Long term (current) use of oral hypoglycemic drugs: Secondary | ICD-10-CM

## 2022-10-09 DIAGNOSIS — I739 Peripheral vascular disease, unspecified: Secondary | ICD-10-CM

## 2022-10-09 LAB — POCT URINALYSIS DIPSTICK
Bilirubin, UA: NEGATIVE
Blood, UA: 10
Glucose, UA: NEGATIVE
Ketones, UA: NEGATIVE
Nitrite, UA: POSITIVE
Protein, UA: POSITIVE — AB
Spec Grav, UA: 1.015 (ref 1.010–1.025)
Urobilinogen, UA: 0.2 U/dL
pH, UA: 6 (ref 5.0–8.0)

## 2022-10-09 LAB — GLUCOSE, RANDOM: Glucose, Bld: 105 mg/dL — ABNORMAL HIGH (ref 70–99)

## 2022-10-09 LAB — HEMOGLOBIN A1C: Hgb A1c MFr Bld: 7.6 % — ABNORMAL HIGH (ref 4.6–6.5)

## 2022-10-09 MED ORDER — LANTUS SOLOSTAR 100 UNIT/ML ~~LOC~~ SOPN
25.0000 [IU] | PEN_INJECTOR | Freq: Two times a day (BID) | SUBCUTANEOUS | 3 refills | Status: DC
Start: 2022-10-09 — End: 2023-03-06

## 2022-10-09 MED ORDER — NITROFURANTOIN MONOHYD MACRO 100 MG PO CAPS
100.0000 mg | ORAL_CAPSULE | Freq: Two times a day (BID) | ORAL | 0 refills | Status: AC
Start: 2022-10-09 — End: ?

## 2022-10-09 NOTE — Assessment & Plan Note (Signed)
Continue Repatha and Vascepa 1 gm, 2 caps bid.

## 2022-10-09 NOTE — Progress Notes (Signed)
[facility] [address] [place] [address] Dept: [phone] Dept Fax: [phone]  Chronic Care Office Visit  Subjective:    Patient ID: [name], female    DOB: May 17, 1940, 82 y.o..   MRN: [id number]  Chief Complaint  Patient presents with   Hypertension    3 month f/u.  Fasting today.  C/o having urine frequency/urgency    History of Present Illness:  Patient is in today for reassessment of chronic medical issues.  [name] has a history of peripheral artery disease. 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  She now has a prosthesis, which was recently changed out. She is ambulating with either a wheeled-walker or a cane in most situations. She has severe PAD of the right leg, which is being followed by vascular surgery. She had a new stent placed in June.   [name] has a past history of aortic stenosis and coronary artery disease. She had CABG x 2 and aortic valve replacement in 04/2020. She also has hypertension and hyperlipidemia.  She is managed on aspirin 81 mg daily, clopidogrel 75 mg daily, metoprolol tartrate 50 mg bid, and amlodipine 10 mg daily. She has been intolerant of statins and is on Repatha and Vascepa.   [name] has Type 2 diabetes.She is managed on glipizide 10 mg, semaglutide (Ozempic) 0.5 mg weekly, and insulin glargine 20 units bid. She notes her blood sugars have been improved since she has been on a higher dose of insulin. Her fasting glucose this morning was 100. Her diabetes is complicated with peripheral neuropathy.   [name] has a history of hypothyroidism. She is managed on levothyroxine 125 mcg daily.    [name] has a history of rheumatoid arthritis and is on periodic infusions of Simponi. She also has erosive osteoarthritis of the hands. She feels she is managing with the discomfort by taking  Tylenol.  [name] notes she has been having urinary frequency and a pressure sensation over her bladder for the past 2 weeks.  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   Primary insomnia 11/02/2020   Irritation of eyelid 07/26/2020   S/P aortic valve replacement with bioprosthetic valve 05/04/2020   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   Peripheral artery disease (HCC)    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) 04/07/2019   Scoliosis deformity of spine 02/03/2019   Osteoarthritis of hip 11/17/2018   Claudication (HCC)    Chronic headaches 11/12/2017   Degeneration of lumbar intervertebral disc 06/13/2017   Stage 3b chronic kidney disease (CKD) (HCC)    Mass of left adrenal gland (HCC) 04/19/2017   Spinal stenosis of lumbar region 10/20/2014   Hyperlipidemia 03/16/2013   Dense breasts 12/22/2012   Anxiety state 04/16/2012   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/16/2012   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   Osteoarthritis of knee 04/16/2012   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 female breast (HCC) 12/20/2010   History of breast  cancer 12/05/2010   Depression 09/14/2008   GERD 09/14/2008   Type 2 diabetes mellitus with neurologic complication, with long-term current use of insulin (HCC) 05/20/2008   Essential hypertension 04/17/2007   Coronary atherosclerosis 04/17/2007   Hypothyroidism 04/03/2007   Past Surgical History:  Procedure Laterality Date   ABDOMINAL AORTOGRAM W/LOWER EXTREMITY  Bilateral 04/29/2019   Procedure: ABDOMINAL AORTOGRAM W/LOWER EXTREMITY;  Surgeon: Cephus Shelling, MD;  [facility];  Service: Cardiovascular;  Laterality: Bilateral;   ABDOMINAL AORTOGRAM W/LOWER EXTREMITY Left 07/01/2019   Procedure: ABDOMINAL AORTOGRAM W/ Left LOWER EXTREMITY [place];  Surgeon: Cephus Shelling, MD;  Location: [facility];  Service: Cardiovascular;  Laterality: Left;   ABDOMINAL AORTOGRAM W/LOWER EXTREMITY Left 03/03/2021   Procedure: ABDOMINAL AORTOGRAM W/LOWER EXTREMITY;  Surgeon: Leonie Douglas, MD;  Location: [facility];  Service: Cardiovascular;  Laterality: Left;   ABDOMINAL AORTOGRAM W/LOWER EXTREMITY Right 06/12/2021   Procedure: ABDOMINAL AORTOGRAM W/LOWER EXTREMITY;  Surgeon: Maeola Harman, MD;  Location: [facility] INVASIVE CV LAB;  Service: Cardiovascular;  Laterality: Right;  LOWER LEG   ABDOMINAL AORTOGRAM W/LOWER EXTREMITY N/A 03/07/2022   Procedure: ABDOMINAL AORTOGRAM W/LOWER EXTREMITY;  Surgeon: Victorino Sparrow, MD;  Location: [facility] INVASIVE CV LAB;  Service: Cardiovascular;  Laterality: N/A;   ABDOMINAL AORTOGRAM W/LOWER EXTREMITY N/A 07/18/2022   Procedure: ABDOMINAL AORTOGRAM W/LOWER EXTREMITY;  Surgeon: Victorino Sparrow, MD;  Location: [facility] INVASIVE CV LAB;  Service: Cardiovascular;  Laterality: N/A;   AMPUTATION Left 03/06/2021   Procedure: LEFT ABOVE KNEE AMPUTATION;  Surgeon: Victorino Sparrow, MD;  Location: [facility] OR;  Service: Vascular;  Laterality: Left;   ANTERIOR AND POSTERIOR REPAIR N/A 04/05/2016   Procedure: ANTERIOR (CYSTOCELE) AND POSTERIOR REPAIR (RECTOCELE);  Surgeon: Zelphia Cairo, MD;  Location: [facility];  Service: Gynecology;  Laterality: N/A;   AORTIC VALVE REPLACEMENT N/A 05/04/2020   Procedure: AORTIC VALVE REPLACEMENT (AVR) USING INSPIRIS RESILIA AORTIC VALVE;  Surgeon: Purcell Nails, MD;  Location: [facility] OR;  Service: Open Heart Surgery;  Laterality: N/A;   BREAST BIOPSY     BREAST LUMPECTOMY  1983    benign biopsy   BREAST LUMPECTOMY Right 12/29/2010   snbx, ER/PR +, Her2 -, 0/1 node pos.   CARPAL TUNNEL RELEASE Bilateral 9/12,8,12   CHOLECYSTECTOMY     COLONOSCOPY N/A 02/09/2013   Procedure: COLONOSCOPY;  Surgeon: Hart Carwin, MD;  Location: [facility];  Service: Endoscopy;  Laterality: N/A;   COLONOSCOPY     CORONARY ANGIOPLASTY     CORONARY ARTERY BYPASS GRAFT N/A 05/04/2020   Procedure: CORONARY ARTERY BYPASS GRAFTING (CABG), ON PUMP, TIMES TWO, USING LEFT INTERNAL MAMMARY ARTERY AND RIGHT ENDOSCOPICALLY HARVESTED GREATER SAPHENOUS VEIN;  Surgeon: Purcell Nails, MD;  Location: [facility] OR;  Service: Open Heart Surgery;  Laterality: N/A;   DILATION AND CURETTAGE OF UTERUS     ESOPHAGOGASTRODUODENOSCOPY N/A 02/09/2013   Procedure: ESOPHAGOGASTRODUODENOSCOPY (EGD);  Surgeon: Hart Carwin, MD;  Location: [facility];  Service: Endoscopy;  Laterality: N/A;   FOOT SPURS     HYSTEROSCOPY WITH D & C N/A 09/11/2012   Procedure: DILATATION AND CURETTAGE ;  Surgeon: Zelphia Cairo, MD;  Location: [facility];  Service: Gynecology;  Laterality: N/A;   KNEE SURGERY  2007   LAPAROSCOPIC ASSISTED VAGINAL HYSTERECTOMY N/A 04/05/2016   Procedure: LAPAROSCOPIC ASSISTED VAGINAL HYSTERECTOMY possible BSO;  Surgeon: Zelphia Cairo, MD;  Location: [facility];  Service: Gynecology;  Laterality: N/A;   LUMBAR LAMINECTOMY/DECOMPRESSION MICRODISCECTOMY N/A  10/20/2014   Procedure: MICRO LUMBER DECOMPRESSION L3-4 L4-5;  Surgeon: Jene Every, MD;  Location: [facility];  Service: Orthopedics;  Laterality: N/A;   LUMBAR LAMINECTOMY/DECOMPRESSION MICRODISCECTOMY Bilateral 10/13/2015   Procedure: MICRO LUMBAR DECOMPRESSION L5 - S1 AND REDO DECOMPRESSION L4 - L5 AND REMOVAL OF FACET CYST L4 - L5 2 LEVELS;  Surgeon: Jene Every, MD;  Location: [facility];  Service: Orthopedics;  Laterality: Bilateral;   PERIPHERAL VASCULAR ATHERECTOMY Right 06/12/2021   Procedure: PERIPHERAL VASCULAR ATHERECTOMY;  Surgeon: Maeola Harman,  MD;  Location: [facility] INVASIVE CV LAB;  Service: Cardiovascular;  Laterality: Right;  SFA/POP/AT   PERIPHERAL VASCULAR INTERVENTION Right 04/29/2019   Procedure: PERIPHERAL VASCULAR INTERVENTION;  Surgeon: Cephus Shelling, MD;  [facility];  Service: Cardiovascular;  Laterality: Right;   PERIPHERAL VASCULAR INTERVENTION Right 06/12/2021   Procedure: PERIPHERAL VASCULAR INTERVENTION;  Surgeon: Maeola Harman, MD;  Location: [facility] INVASIVE CV LAB;  Service: Cardiovascular;  Laterality: Right;  SFP/POPLITEAL/AT   PERIPHERAL VASCULAR INTERVENTION  03/07/2022   Procedure: PERIPHERAL VASCULAR INTERVENTION;  Surgeon: Victorino Sparrow, MD;  Location: [facility] INVASIVE CV LAB;  Service: Cardiovascular;;   PERIPHERAL VASCULAR INTERVENTION Right 07/18/2022   Procedure: PERIPHERAL VASCULAR INTERVENTION;  Surgeon: Victorino Sparrow, MD;  Location: [facility] INVASIVE CV LAB;  Service: Cardiovascular;  Laterality: Right;   PORT-A-CATH REMOVAL  04/17/2011   Procedure: REMOVAL PORT-A-CATH;  Surgeon: Emelia Loron, MD;  Location: [facility];  Service: General;  Laterality: Left;   PORTACATH PLACEMENT  02/07/2011   Procedure: INSERTION PORT-A-CATH;  Surgeon: Emelia Loron, MD;  Location: [facility];  Service: General;  Laterality: N/A;   RIGHT/LEFT HEART CATH AND CORONARY ANGIOGRAPHY N/A 03/17/2020   Procedure: RIGHT/LEFT HEART CATH AND CORONARY ANGIOGRAPHY;  Surgeon: Kathleene Hazel, MD;  [facility];  Service: Cardiovascular;  Laterality: N/A;   TEE WITHOUT CARDIOVERSION N/A 05/04/2020   Procedure: TRANSESOPHAGEAL ECHOCARDIOGRAM (TEE);  Surgeon: Purcell Nails, MD;  Location: [facility] OR;  Service: Open Heart Surgery;  Laterality: N/A;   UPPER GASTROINTESTINAL ENDOSCOPY     Family History  Problem Relation Age of Onset   Kidney disease Mother    Heart disease Mother    Throat cancer Father    Alcoholism Father    Heart attack Father    Lymphoma Brother 59   Heart  attack Brother    Diabetes Brother    Hypertension Brother    Cancer Son    Colon cancer Neg Hx    Stroke Neg Hx    Esophageal cancer Neg Hx    Rectal cancer Neg Hx    Stomach cancer Neg Hx    Outpatient Medications Prior to Visit  Medication Sig Dispense Refill   Accu-Chek FastClix Lancets MISC USE TO CHECK BLOOD SUGARS 3 TIMES DAILY 306 each 3   amLODipine (NORVASC) 10 MG tablet Take 1 tablet (10 mg total) by mouth daily. 90 tablet 2   aspirin EC 81 MG tablet Take 81 mg by mouth in the morning.     blood glucose meter kit and supplies Dispense based on patient and insurance preference. Use up to four times daily as directed. (FOR ICD-10 E10.9, E11.9). 1 each 0   Cholecalciferol 125 MCG (5000 UT) capsule Take 5,000 Units by mouth daily.     clobetasol ointment (TEMOVATE) 0.05 % APPLY A THIN LAYER TO THE AFFECTED AREA(S) BY TOPICAL ROUTE 2 TIMES PER DAY     clopidogrel (PLAVIX) 75 MG tablet TAKE  1 TABLET(75 MG) BY MOUTH DAILY 30 tablet 11   glipiZIDE (GLUCOTROL XL) 10 MG 24 hr tablet Take 1 tablet (10 mg total) by mouth daily with breakfast. 90 tablet 3   glucose blood (ACCU-CHEK GUIDE) test strip TEST ONCE DAILY 100 each 5   Golimumab (SIMPONI ARIA IV) Inject into the vein every 2 (two) months.     insulin glargine (LANTUS SOLOSTAR) 100 UNIT/ML Solostar Pen Inject 20 Units into the skin 2 (two) times daily. 15 mL 3   Insulin Pen Needle (CAREFINE PEN NEEDLES) 32G X 6 MM MISC 1 application by Does not apply route 2 (two) times daily. 120 each 1   levothyroxine (SYNTHROID) 125 MCG tablet TAKE 1 TABLET BY MOUTH DAILY 90 tablet 3   metoprolol tartrate (LOPRESSOR) 50 MG tablet TAKE 1 TABLET BY MOUTH TWICE  DAILY 180 tablet 1   pantoprazole (PROTONIX) 40 MG tablet TAKE 1 TABLET BY MOUTH DAILY 90 tablet 3   PARoxetine (PAXIL) 20 MG tablet TAKE 1 TABLET BY MOUTH IN THE  MORNING 90 tablet 3   REPATHA SURECLICK 140 MG/ML SOAJ INJECT 1 PEN INTO THE SKIN EVERY 14 DAYS 2 mL 11   Semaglutide,0.25 or  0.5MG /DOS, 2 MG/1.5ML SOPN Inject 0.5 mg into the skin every Tuesday. 3 mL 6   valACYclovir (VALTREX) 1000 MG tablet Take 1 tablet (1,000 mg total) by mouth daily as needed (Fever Blisters). 180 tablet 0   VASCEPA 1 g capsule Take 2 capsules (2 g total) by mouth 2 (two) times daily. (Patient not taking: Reported on 09/07/2022) 120 capsule 3   No facility-administered medications prior to visit.   Allergies  Allergen Reactions   Codeine Nausea And Vomiting and Other (See Comments)    Severe stomach cramps, also Other reaction(s): Unknown   Antihistamines, Diphenhydramine-Type Other (See Comments)    Causes hyperactivity   Diphenhydramine     Other reaction(s): Unknown   Folic Acid Other (See Comments)    Other reaction(s): tongue pain   Gabapentin     Urinary incontinence   Lyrica [Pregabalin]     Double vision/falls   Methotrexate Other (See Comments)    Other reaction(s): oral ulcers   Oxycodone     Hallucinations/crazy feelings   Rheumate [Fish Oil]     Other reaction(s): hypoglycemia   Statins Other (See Comments)    Joint pains Other reaction(s): Unknown   Sulfa Antibiotics Other (See Comments)    Reaction not recalled   Erythromycin Hives, Rash and Other (See Comments)    Allergic due to dental work from 50 years ago. It was used in packing and resulted in rash/hives inside and outside of mouth.   Trulicity [Dulaglutide] Itching   Objective:   Today's Vitals   10/09/22 0752  Temp: (!) 97.2 F (36.2 C)  TempSrc: Temporal  Weight: 146 lb 6.4 oz (66.4 kg)  Height: 5\' 3"  (1.6 m)   Body mass index is 25.93 kg/m.   General: Well developed, well nourished. No acute distress. Psych: Alert and oriented. Normal mood and affect.  Health Maintenance Due  Topic Date Due   DTaP/Tdap/Td (1 - Tdap) Never done   Zoster Vaccines- Shingrix (2 of 2) 03/29/2011   Lab Results Component Ref Range & Units 08:15 (10/09/22) 2 yr ago (05/02/20)  Color, UA yellow   Clarity, UA  cloudy   Glucose, UA Negative Negative   Bilirubin, UA neg   Ketones, UA neg   Spec Grav, UA 1.010 - 1.025 1.015  Blood, UA 10 Ery/ul   pH, UA 5.0 - 8.0 6.0   Protein, UA Negative Positive Abnormal    Comment: 2+  Urobilinogen, UA 0.2 or 1.0 E.U./dL 0.2   Nitrite, UA positive   Leukocytes, UA Negative Large (3+) Abnormal       Assessment & Plan:   Problem List Items Addressed This Visit       Cardiovascular and Mediastinum   Essential hypertension    Diastolic BP is at goal. Continue amlodipine 10 mg daily and metoprolol tartrate 50 mg bid.      Peripheral artery disease [facility])    [name] has severe PAD. We continue to focus on optimal diabetes, hypertension, and hyperlipidemia control.        Endocrine   Hypothyroidism (Chronic)    TSH has been at goal. Continue levothyroxine 125 mcg daily.      Type 2 diabetes mellitus with neurologic complication, with long-term current use of insulin (HCC) - Primary    I will check an A1c today to determine if she is meeting goals. Continue glipizide 10 mg , semaglutide (Ozempic) 0.5 mg weekly, and insulin glargine 20 units bid.      Relevant Orders   Glucose, random   Hemoglobin A1c     Other   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HCC)    Stable. New prosthesis working well.      Hyperlipidemia    Continue Repatha and Vascepa 1 gm, 2 caps bid.      Other Visit Diagnoses     Acute cystitis without hematuria       UA consistent with acute UTI. I will send urine for culture. I will prescribe nitrofurantoin.   Relevant Medications   nitrofurantoin, macrocrystal-monohydrate, (MACROBID) 100 MG capsule   Other Relevant Orders   POCT Urinalysis Dipstick (Completed)   Urine Culture       Return in about 3 months (around 01/08/2023) for Reassessment.   Loyola Mast, MD

## 2022-10-09 NOTE — Assessment & Plan Note (Signed)
TSH has been at goal. Continue levothyroxine 125 mcg daily.

## 2022-10-09 NOTE — Assessment & Plan Note (Signed)
Diastolic BP is at goal. Continue amlodipine 10 mg daily and metoprolol tartrate 50 mg bid.

## 2022-10-09 NOTE — Assessment & Plan Note (Signed)
[name] has severe PAD. We continue to focus on optimal diabetes, hypertension, and hyperlipidemia control.

## 2022-10-09 NOTE — Assessment & Plan Note (Signed)
I will check an A1c today to determine if she is meeting goals. Continue glipizide 10 mg , semaglutide (Ozempic) 0.5 mg weekly, and insulin glargine 20 units bid.

## 2022-10-09 NOTE — Addendum Note (Signed)
Addended by: Loyola Mast on: 10/09/2022 03:11 PM   Modules accepted: Orders

## 2022-10-09 NOTE — Assessment & Plan Note (Signed)
Stable. New prosthesis working well.

## 2022-10-11 LAB — URINE CULTURE
MICRO NUMBER:: 15477774
SPECIMEN QUALITY:: ADEQUATE

## 2022-10-18 ENCOUNTER — Other Ambulatory Visit: Payer: Self-pay | Admitting: Family Medicine

## 2022-10-18 DIAGNOSIS — E1140 Type 2 diabetes mellitus with diabetic neuropathy, unspecified: Secondary | ICD-10-CM

## 2022-10-19 NOTE — Progress Notes (Unsigned)
Date:  10/19/2022   ID:  [name], [name] Jul 21, 1940, MRN [id number]  PCP:  Loyola Mast, MD   [facility] Providers Cardiologist:  Eden Emms  Patient Profile:    [name] is a 82 y.o. female with:  Coronary artery disease  Aortic stenosis S/p DES to RCA in 2007 Myoview 2017 low risk  S/p CABG + AVR 4/22 Hypertension  Diabetes mellitus  Chronic kidney disease  Hyperlipidemia  Breast CA  Peripheral arterial disease  S/p R SFA stent GERD Hypothyroidism  Carotid artery disease  S/p carotid stent in 2007 [place] 4/22: Bilat 1-39  Prior CV studies: Pre-CABG Dopplers 05/02/20 Bilateral ICA 1-39   RIGHT/LEFT HEART CATH AND CORONARY ANGIOGRAPHY 03/17/2020 Narrative  Ost RCA to Prox RCA lesion is 80% stenosed.  Mid RCA lesion is 30% stenosed.  RPDA lesion is 60% stenosed.  1st Mrg lesion is 50% stenosed.  Prox Cx to Mid Cx lesion is 80% stenosed.  Mid LAD lesion is 80% stenosed. 1. No significant disease in the left main artery 2. Severe mid LAD stenosis. The LAD bifurcated distally beyond this lesion and the diagonal branch reaches the apex. 3. Moderate caliber non-dominant Circumflex with severe mid stenosis. The vessel is relatively small beyond the stenosis. The small to moderate caliber obtuse marginal branch arises prior to the stenosis and has moderate ostial stenosis. 4. The RCA is a large dominant artery. There is severe, heavily calcified stenosis at the ostium of the RCA. The distal stented segment is patent. There is moderately severe stenosis in the moderate caliber PDA. 5. Severe aortic stenosis (mean gradient 26.9 mmHg, peak gradient 34 mmHg)  Echocardiogram 02/29/20 Moderate-severe aortic stenosis, V-max 3.5 m/s, mean gradient 33 mmHg, DI 0.26, moderate MR, EF 65-70, no RWMA, moderate LVH, normal RVSF  GATED SPECT MYO PERF W/EXERCISE STRESS 1D 11/22/2015 Narrative  Nuclear stress EF: 59%. Normal wall motion  There was no ST segment  deviation noted during stress.  This is a low risk study. No perfusion defects, no ischemia   HPI:   82 y.o. with CAD and now post CABG/AVR  03/17/20 Cardiac catheterization demonstrated severe disease in the LAD and RCA.  There was also severe disease in a very small LCx.  She was referred to Dr. Cornelius Moras for CABG+AVR. She was admitted 4/13-4/18 and underwent CABG (L-LAD, S-PDA) + bioprosthetic AVR. Using a 23 mm Edwards Inspiris Resilia bovine pericardial valve   Circumflex / OM  too small to graft      Post op TTE 06/15/20 with EF 60-65% moderate MR normal AVR with no AR and mean gradient 4 peak 6.7 mmHg DVI 0.56.  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/21  Had limb threatening RLE ischemia and rest pain with intervention by Dr Randie Heinz on 06/12/21 including stent to SFA/popliteal and anterior tibial The right ABI was 0.77 on 08/17/22 Last intervention repeat stenting of right ATA 07/18/22   She feels much better Has nice prosthetic leg on left and more ambulatory  ***     Past Medical History:  Diagnosis Date   Allergy    Anemia    childhood   Anxiety    Aortic stenosis    Arterial stenosis (HCC)    mesenteric   Arthritis    Breast cancer (HCC) 12/05/10   R  breast, inv mammary, in situ,ER/PR +,HER2 -   Cancer [facility])    Carcinoma of breast treated with adjuvant chemotherapy (HCC)    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HCC)    Claudication (HCC)    Coronary artery disease    stent 2007   Depression    Diabetes mellitus    Difficulty sleeping    Diverticulosis 12/10/03   Elevated cholesterol    Generalized weakness    GERD (gastroesophageal reflux disease)    Heart murmur    Hepatitis    as an infant   HSV-1 (herpes simplex virus 1) infection    Hyperlipidemia    Hypertension    Hypothyroidism    Osteoarthritis    PAD  (peripheral artery disease) (HCC)    Personal history of chemotherapy    Personal history of radiation therapy    Pneumonia    2014 september and March 2022   Rheumatoid arthritis (HCC)    S/P aortic valve replacement with bioprosthetic valve 05/04/2020   Edwards Inspiris Resilia stented bovine pericardial tissue valve, size 23 mm   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   LIMA to D3, SVG to PDA, EVH via right thigh   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     Allergies:   Codeine; Antihistamines, diphenhydramine-type; Diphenhydramine; Folic acid; Gabapentin; Lyrica [pregabalin]; Methotrexate; Oxycodone; Rheumate [fish oil]; Statins; Sulfa antibiotics; Erythromycin; and Trulicity [dulaglutide]   Social History   Tobacco Use   Smoking status: Former    Current packs/day: 0.00    Average packs/day: 2.0 packs/day for 40.0 years (80.0 ttl pk-yrs)    Types: Cigarettes    Start date: 12/19/1957    Quit date: 12/19/1997    Years since quitting: 24.8   Smokeless tobacco: Never  Vaping Use   Vaping status: Never Used  Substance Use Topics   Alcohol use: Yes    Alcohol/week: 1.0 standard drink of alcohol    Types: 1 Glasses of wine per week    Comment: occasiona - maybe once a month   Drug use: No     Family Hx: The patient's family history includes Alcoholism in her father; Cancer in her son; Diabetes in her brother; Heart attack in her brother and father; Heart disease in her mother; Hypertension in her brother; Kidney disease in her mother; Lymphoma (age of onset: 42) in her brother; Throat cancer in her father. There is no history of Colon cancer, Stroke, Esophageal cancer, Rectal cancer, or Stomach cancer.  Review of Systems  Constitutional: Negative for decreased appetite and fever.  Respiratory:   Negative for cough.      EKGs/Labs/Other Test Reviewed:    EKG:   SR LVH with strain ? Old IMI 05/30/20   Recent Labs: 01/01/2022: TSH 0.50 07/18/2022: BUN 24; Creatinine, Ser 1.30; Hemoglobin 13.3; Potassium 3.8; Sodium 138   Recent Lipid Panel Lab Results  Component Value Date/Time   CHOL 95 01/01/2022 09:00 AM   CHOL 125 02/09/2021 08:16 AM   TRIG 225.0 (H) 01/01/2022 09:00 AM   HDL 34.30 (L) 01/01/2022 09:00 AM   HDL 43 02/09/2021 08:16 AM   CHOLHDL 3 01/01/2022 09:00 AM   LDLCALC 35 03/04/2021 01:34 AM   LDLCALC 45 02/09/2021 08:16 AM   LDLDIRECT 33.0 01/01/2022  09:00 AM      Risk Assessment/Calculations:      Physical Exam:    VS:  There were no vitals taken for this visit.    Wt Readings from Last 3 Encounters:  10/09/22 146 lb 6.4 oz (66.4 kg)  08/17/22 136 lb (61.7 kg)  07/18/22 145 lb (65.8 kg)     Chronically ill female Post sternotomy SEM through AVR no AR Abdomen benign Post left AKA Poorly palpable RLE pulses         ASSESSMENT & PLAN:    CAD/CABG:  05/04/20 LIMA to Diagonal and SVG to PdA continue ASA And beta blocker AVR:  bicuspid pathology post 23 mm Edwards Inspiris Resilia stented bovine pericardial tissue valve TTE 03/01/21 normal function EF 55-60% no PVL peak gradient only 12 mmHg  HLD:  Intolerant to statins back on Repatha LDL 45 improved  DM:  Discussed low carb diet.  Target hemoglobin A1c is 6.5 or less.  Continue current medications. HTN:  elevated start Norvasc 10 mg  Thyroid : on synthroid replacement check labs  PVD:  post left  AKA 02/2021 and complex limb salvage intervention by Dr Randie Heinz to RLE 06/12/21 and repeat stent 07/18/22 to ATA  with improvement ABI 0.77 on 08/16/22  MR:  TTE 03/01/21 only mild MR    F/U 6 months     Signed, Charlton Haws, MD  10/19/2022 11:26 AM    [facility] [address] [place], [place], [place]  [address] Phone: [phone]; Fax: [phone]

## 2022-10-22 ENCOUNTER — Telehealth: Payer: Self-pay | Admitting: Family Medicine

## 2022-10-22 NOTE — Telephone Encounter (Signed)
[name]  [facility]  Stated that pts levothyroxine (SYNTHROID) 125 MCG tablet [[id number]] is being chsnged from [facility] to [employer].  They need your approval before they can fill  [facility] - [place] - [address] [place] [address] Phone: [phone]  Fax: [phone]

## 2022-10-22 NOTE — Telephone Encounter (Signed)
Form received regarding change in manufacture.  Signed and faxed back to [phone]. Dm/cma

## 2022-10-25 ENCOUNTER — Ambulatory Visit: Payer: Medicare Other | Attending: Cardiovascular Disease | Admitting: Cardiovascular Disease

## 2022-10-25 ENCOUNTER — Encounter: Payer: Self-pay | Admitting: Cardiovascular Disease

## 2022-10-25 VITALS — BP 136/72 | HR 76 | Ht 63.0 in | Wt 146.0 lb

## 2022-10-25 DIAGNOSIS — I739 Peripheral vascular disease, unspecified: Secondary | ICD-10-CM

## 2022-10-25 DIAGNOSIS — Z953 Presence of xenogenic heart valve: Secondary | ICD-10-CM

## 2022-10-25 DIAGNOSIS — E782 Mixed hyperlipidemia: Secondary | ICD-10-CM

## 2022-10-25 DIAGNOSIS — I10 Essential (primary) hypertension: Secondary | ICD-10-CM | POA: Diagnosis not present

## 2022-10-25 NOTE — Patient Instructions (Signed)
Medication Instructions:  Your physician recommends that you continue on your current medications as directed. Please refer to the Current Medication list given to you today.  *If you need a refill on your cardiac medications before your next appointment, please call your pharmacy*  Lab Work: If you have labs (blood work) drawn today and your tests are completely normal, you will receive your results only by: MyChart Message (if you have MyChart) OR A paper copy in the mail If you have any lab test that is abnormal or we need to change your treatment, we will call you to review the results.  Testing/Procedures: None ordered today.  Follow-Up: At [facility], you and your health needs are our priority.  As part of our continuing mission to provide you with exceptional heart care, we have created designated Provider Care Teams.  These Care Teams include your primary Cardiologist (physician) and Advanced Practice Providers (APPs -  Physician Assistants and Nurse Practitioners) who all work together to provide you with the care you need, when you need it.  We recommend signing up for the patient portal called "MyChart".  Sign up information is provided on this After Visit Summary.  MyChart is used to connect with patients for Virtual Visits (Telemedicine).  Patients are able to view lab/test results, encounter notes, upcoming appointments, etc.  Non-urgent messages can be sent to your provider as well.   To learn more about what you can do with MyChart, go to [email or web address].    Your next appointment:   6 month(s)  Provider:   Peter Nishan, MD      

## 2022-11-05 ENCOUNTER — Ambulatory Visit (INDEPENDENT_AMBULATORY_CARE_PROVIDER_SITE_OTHER): Payer: Medicare Other | Admitting: Family Medicine

## 2022-11-05 ENCOUNTER — Encounter: Payer: Self-pay | Admitting: Family Medicine

## 2022-11-05 ENCOUNTER — Telehealth: Payer: Self-pay | Admitting: Family Medicine

## 2022-11-05 VITALS — BP 134/76 | HR 84 | Temp 97.3°F | Ht 63.0 in | Wt 150.0 lb

## 2022-11-05 DIAGNOSIS — N3000 Acute cystitis without hematuria: Secondary | ICD-10-CM | POA: Diagnosis not present

## 2022-11-05 LAB — POCT URINALYSIS DIPSTICK
Bilirubin, UA: NEGATIVE
Blood, UA: 10
Glucose, UA: NEGATIVE
Ketones, UA: NEGATIVE
Nitrite, UA: NEGATIVE
Protein, UA: POSITIVE — AB
Spec Grav, UA: 1.015 (ref 1.010–1.025)
Urobilinogen, UA: 0.2 U/dL
pH, UA: 6 (ref 5.0–8.0)

## 2022-11-05 MED ORDER — NITROFURANTOIN MONOHYD MACRO 100 MG PO CAPS
100.0000 mg | ORAL_CAPSULE | Freq: Two times a day (BID) | ORAL | 0 refills | Status: DC
Start: 2022-11-05 — End: 2023-04-05

## 2022-11-05 NOTE — Telephone Encounter (Signed)
Called patient and scheduled her for 3:20 today. Dm/cma

## 2022-11-05 NOTE — Telephone Encounter (Signed)
[name] [phone]  Pt was seen on 9/17 for a kidney infection and treated. She stated it is back with same symptoms. She would like to know what needs to happen. Should she come in or can you call something in?

## 2022-11-05 NOTE — Progress Notes (Signed)
[facility] [address] [place] [address] Dept: [phone] Dept Fax: [phone]  Office Visit  Subjective:    Patient ID: [name], female    DOB: 1940/07/03, 82 y.o..   MRN: [id number]  Chief Complaint  Patient presents with   Urinary Frequency    Urine frequency, urgency x 4 days.      History of Present Illness:  Patient is in today complaining of typical UTI symptoms of urinary frequency and a pressure sensation over the bladder. I had seen her ~ 1 month ago with similar findings. Her urine culture had shown > 100,000 cfu of E. coli which was pansensitive. I had treated her with nitrofurantoin for 7 days. She notes her symptoms did completely resolve. Now they have recurred over the past 4 days. She has had frequent UTIs in the past several years.  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   Primary insomnia 11/02/2020   Irritation of eyelid 07/26/2020   S/P aortic valve replacement with bioprosthetic valve 05/04/2020   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   Peripheral artery disease (HCC)    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) 04/07/2019   Scoliosis deformity of spine 02/03/2019   Osteoarthritis of hip 11/17/2018   Claudication (HCC)    Chronic headaches 11/12/2017   Degeneration of lumbar intervertebral disc 06/13/2017   Stage 3b chronic kidney disease (CKD) (HCC)    Mass of left adrenal gland (HCC) 04/19/2017   Spinal stenosis of lumbar region  10/20/2014   Hyperlipidemia 03/16/2013   Dense breasts 12/22/2012   Anxiety state 04/16/2012   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/16/2012   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   Osteoarthritis of knee 04/16/2012   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 female breast (HCC) 12/20/2010   History of breast cancer 12/05/2010   Depression 09/14/2008   GERD 09/14/2008   Type 2 diabetes mellitus with neurologic complication, with long-term current use of insulin (HCC) 05/20/2008   Essential hypertension 04/17/2007   Coronary atherosclerosis 04/17/2007   Hypothyroidism 04/03/2007   Past Surgical History:  Procedure Laterality Date   ABDOMINAL AORTOGRAM W/LOWER EXTREMITY Bilateral 04/29/2019   Procedure: ABDOMINAL AORTOGRAM W/LOWER EXTREMITY;  Surgeon: Cephus Shelling, MD;  Location: [facility] INVAS[facility];  Service: Cardiovascular;  Laterality: Bilateral;   ABDOMINAL AORTOGRAM W/LOWER EXTREMITY Left 07/01/2019   Procedure: ABDOMINAL AORTOGRAM W/ Left LOWER EXTREMITY [place];  Surgeon: Cephus Shelling, MD;  Location: [facility];  Service: Cardiovascular;  Laterality: Left;   ABDOMINAL AORTOGRAM W/LOWER EXTREMITY Left 03/03/2021   Procedure: ABDOMINAL AORTOGRAM W/LOWER EXTREMITY;  Surgeon: Leonie Douglas, MD;  Location: [facility];  Service: Cardiovascular;  Laterality: Left;   ABDOMINAL AORTOGRAM W/LOWER EXTREMITY Right 06/12/2021   Procedure: ABDOMINAL AORTOGRAM W/LOWER EXTREMITY;  Surgeon: Maeola Harman, MD;  Location: [facility] INVASIVE CV LAB;  Service: Cardiovascular;  Laterality: Right;  LOWER LEG   ABDOMINAL AORTOGRAM W/LOWER EXTREMITY N/A 03/07/2022   Procedure: ABDOMINAL AORTOGRAM W/LOWER EXTREMITY;  Surgeon: Victorino Sparrow, MD;  Location: [facility]  INVASIVE CV LAB;  Service: Cardiovascular;  Laterality: N/A;   ABDOMINAL AORTOGRAM W/LOWER EXTREMITY N/A 07/18/2022   Procedure: ABDOMINAL AORTOGRAM W/LOWER EXTREMITY;  Surgeon: Victorino Sparrow, MD;   Location: [facility] INVASIVE CV LAB;  Service: Cardiovascular;  Laterality: N/A;   AMPUTATION Left 03/06/2021   Procedure: LEFT ABOVE KNEE AMPUTATION;  Surgeon: Victorino Sparrow, MD;  Location: [facility] OR;  Service: Vascular;  Laterality: Left;   ANTERIOR AND POSTERIOR REPAIR N/A 04/05/2016   Procedure: ANTERIOR (CYSTOCELE) AND POSTERIOR REPAIR (RECTOCELE);  Surgeon: Zelphia Cairo, MD;  Location: [facility] ORS;  Service: Gynecology;  Laterality: N/A;   AORTIC VALVE REPLACEMENT N/A 05/04/2020   Procedure: AORTIC VALVE REPLACEMENT (AVR) USING INSPIRIS RESILIA AORTIC VALVE;  Surgeon: Purcell Nails, MD;  Location: [facility] OR;  Service: Open Heart Surgery;  Laterality: N/A;   BREAST BIOPSY     BREAST LUMPECTOMY  1983   benign biopsy   BREAST LUMPECTOMY Right 12/29/2010   snbx, ER/PR +, Her2 -, 0/1 node pos.   CARPAL TUNNEL RELEASE Bilateral 9/12,8,12   CHOLECYSTECTOMY     COLONOSCOPY N/A 02/09/2013   Procedure: COLONOSCOPY;  Surgeon: Hart Carwin, MD;  Location: [facility];  Service: Endoscopy;  Laterality: N/A;   COLONOSCOPY     CORONARY ANGIOPLASTY     CORONARY ARTERY BYPASS GRAFT N/A 05/04/2020   Procedure: CORONARY ARTERY BYPASS GRAFTING (CABG), ON PUMP, TIMES TWO, USING LEFT INTERNAL MAMMARY ARTERY AND RIGHT ENDOSCOPICALLY HARVESTED GREATER SAPHENOUS VEIN;  Surgeon: Purcell Nails, MD;  Location: [facility] OR;  Service: Open Heart Surgery;  Laterality: N/A;   DILATION AND CURETTAGE OF UTERUS     ESOPHAGOGASTRODUODENOSCOPY N/A 02/09/2013   Procedure: ESOPHAGOGASTRODUODENOSCOPY (EGD);  Surgeon: Hart Carwin, MD;  Location: [facility];  Service: Endoscopy;  Laterality: N/A;   FOOT SPURS     HYSTEROSCOPY WITH D & C N/A 09/11/2012   Procedure: DILATATION AND CURETTAGE ;  Surgeon: Zelphia Cairo, MD;  Location: [facility];  Service: Gynecology;  Laterality: N/A;   KNEE SURGERY  2007   LAPAROSCOPIC ASSISTED VAGINAL HYSTERECTOMY N/A 04/05/2016   Procedure: LAPAROSCOPIC ASSISTED VAGINAL HYSTERECTOMY possible BSO;   Surgeon: Zelphia Cairo, MD;  Location: [facility];  Service: Gynecology;  Laterality: N/A;   LUMBAR LAMINECTOMY/DECOMPRESSION MICRODISCECTOMY N/A 10/20/2014   Procedure: MICRO LUMBER DECOMPRESSION L3-4 L4-5;  Surgeon: Jene Every, MD;  Location: [facility];  Service: Orthopedics;  Laterality: N/A;   LUMBAR LAMINECTOMY/DECOMPRESSION MICRODISCECTOMY Bilateral 10/13/2015   Procedure: MICRO LUMBAR DECOMPRESSION L5 - S1 AND REDO DECOMPRESSION L4 - L5 AND REMOVAL OF FACET CYST L4 - L5 2 LEVELS;  Surgeon: Jene Every, MD;  Location: [facility];  Service: Orthopedics;  Laterality: Bilateral;   PERIPHERAL VASCULAR ATHERECTOMY Right 06/12/2021   Procedure: PERIPHERAL VASCULAR ATHERECTOMY;  Surgeon: Maeola Harman, MD;  Location: [facility] INVASIVE CV LAB;  Service: Cardiovascular;  Laterality: Right;  SFA/POP/AT   PERIPHERAL VASCULAR INTERVENTION Right 04/29/2019   Procedure: PERIPHERAL VASCULAR INTERVENTION;  Surgeon: Cephus Shelling, MD;  Location: [facility];  Service: Cardiovascular;  Laterality: Right;   PERIPHERAL VASCULAR INTERVENTION Right 06/12/2021   Procedure: PERIPHERAL VASCULAR INTERVENTION;  Surgeon: Maeola Harman, MD;  Location: [facility] INVASIVE CV LAB;  Service: Cardiovascular;  Laterality: Right;  SFP/POPLITEAL/AT   PERIPHERAL VASCULAR INTERVENTION  03/07/2022   Procedure: PERIPHERAL VASCULAR INTERVENTION;  Surgeon: Victorino Sparrow, MD;  Location: [facility] INVASIVE CV LAB;  Service: Cardiovascular;;   PERIPHERAL VASCULAR INTERVENTION Right 07/18/2022   Procedure: PERIPHERAL VASCULAR INTERVENTION;  Surgeon: Karin Lieu,  Clementeen Graham, MD;  Location: [facility];  Service: Cardiovascular;  Laterality: Right;   PORT-A-CATH REMOVAL  04/17/2011   Procedure: REMOVAL PORT-A-CATH;  Surgeon: Emelia Loron, MD;  Location: [facility];  Service: General;  Laterality: Left;   PORTACATH PLACEMENT  02/07/2011   Procedure: INSERTION PORT-A-CATH;  Surgeon: Emelia Loron, MD;  Location:  [facility] ORS;  Service: General;  Laterality: N/A;   RIGHT/LEFT HEART CATH AND CORONARY ANGIOGRAPHY N/A 03/17/2020   Procedure: RIGHT/LEFT HEART CATH AND CORONARY ANGIOGRAPHY;  Surgeon: Kathleene Hazel, MD;  Location: [facility];  Service: Cardiovascular;  Laterality: N/A;   TEE WITHOUT CARDIOVERSION N/A 05/04/2020   Procedure: TRANSESOPHAGEAL ECHOCARDIOGRAM (TEE);  Surgeon: Purcell Nails, MD;  Location: [facility] OR;  Service: Open Heart Surgery;  Laterality: N/A;   UPPER GASTROINTESTINAL ENDOSCOPY     Family History  Problem Relation Age of Onset   Kidney disease Mother    Heart disease Mother    Throat cancer Father    Alcoholism Father    Heart attack Father    Lymphoma Brother 71   Heart attack Brother    Diabetes Brother    Hypertension Brother    Cancer Son    Colon cancer Neg Hx    Stroke Neg Hx    Esophageal cancer Neg Hx    Rectal cancer Neg Hx    Stomach cancer Neg Hx    Outpatient Medications Prior to Visit  Medication Sig Dispense Refill   Accu-Chek FastClix Lancets MISC USE TO CHECK BLOOD SUGARS 3 TIMES DAILY 306 each 3   amLODipine (NORVASC) 10 MG tablet Take 1 tablet (10 mg total) by mouth daily. 90 tablet 2   aspirin EC 81 MG tablet Take 81 mg by mouth in the morning.     blood glucose meter kit and supplies Dispense based on patient and insurance preference. Use up to four times daily as directed. (FOR ICD-10 E10.9, E11.9). 1 each 0   Cholecalciferol 125 MCG (5000 UT) capsule Take 5,000 Units by mouth daily.     clobetasol ointment (TEMOVATE) 0.05 % APPLY A THIN LAYER TO THE AFFECTED AREA(S) BY TOPICAL ROUTE 2 TIMES PER DAY     clopidogrel (PLAVIX) 75 MG tablet TAKE 1 TABLET(75 MG) BY MOUTH DAILY 30 tablet 11   gatifloxacin (ZYMAXID) 0.5 % SOLN SMARTSIG:In Eye(s)     glipiZIDE (GLUCOTROL XL) 10 MG 24 hr tablet TAKE 1 TABLET BY MOUTH DAILY  WITH BREAKFAST 90 tablet 3   glucose blood (ACCU-CHEK GUIDE) test strip TEST ONCE DAILY 100 each 5   Golimumab (SIMPONI  ARIA IV) Inject into the vein every 2 (two) months.     insulin glargine (LANTUS SOLOSTAR) 100 UNIT/ML Solostar Pen Inject 25 Units into the skin 2 (two) times daily. 15 mL 3   Insulin Pen Needle (CAREFINE PEN NEEDLES) 32G X 6 MM MISC 1 application by Does not apply route 2 (two) times daily. 120 each 1   ketorolac (ACULAR) 0.5 % ophthalmic solution SMARTSIG:In Eye(s)     levothyroxine (SYNTHROID) 125 MCG tablet TAKE 1 TABLET BY MOUTH DAILY 90 tablet 3   metoprolol tartrate (LOPRESSOR) 50 MG tablet TAKE 1 TABLET BY MOUTH TWICE  DAILY 180 tablet 1   OZEMPIC, 0.25 OR 0.5 MG/DOSE, 2 MG/3ML SOPN INJECT SUBCUTANEOUSLY 0.5 MG  WEEKLY 9 mL 3   pantoprazole (PROTONIX) 40 MG tablet TAKE 1 TABLET BY MOUTH DAILY 90 tablet 3   PARoxetine (PAXIL) 20 MG tablet TAKE  1 TABLET BY MOUTH IN THE  MORNING 90 tablet 3   prednisoLONE acetate (PRED FORTE) 1 % ophthalmic suspension SMARTSIG:In Eye(s)     REPATHA SURECLICK 140 MG/ML SOAJ INJECT 1 PEN INTO THE SKIN EVERY 14 DAYS 2 mL 11   Semaglutide,0.25 or 0.5MG /DOS, 2 MG/1.5ML SOPN Inject 0.5 mg into the skin every Tuesday. 3 mL 6   valACYclovir (VALTREX) 1000 MG tablet Take 1 tablet (1,000 mg total) by mouth daily as needed (Fever Blisters). 180 tablet 0   No facility-administered medications prior to visit.   Allergies  Allergen Reactions   Codeine Nausea And Vomiting and Other (See Comments)    Severe stomach cramps, also Other reaction(s): Unknown   Antihistamines, Diphenhydramine-Type Other (See Comments)    Causes hyperactivity   Diphenhydramine     Other reaction(s): Unknown   Erythromycin Hives, Rash and Other (See Comments)    Allergic due to dental work from 50 years ago. It was used in packing and resulted in rash/hives inside and outside of mouth.   Folic Acid Other (See Comments)    Other reaction(s): tongue pain   Gabapentin     Urinary incontinence   Lyrica [Pregabalin]     Double vision/falls   Methotrexate Other (See Comments)    Other  reaction(s): oral ulcers   Oxycodone     Hallucinations/crazy feelings   Rheumate [Fish Oil]     Other reaction(s): hypoglycemia   Statins Other (See Comments)    Joint pains Other reaction(s): Unknown   Sulfa Antibiotics Other (See Comments)    Reaction not recalled   Trulicity [Dulaglutide] Itching     Objective:   Today's Vitals   11/05/22 1526 11/05/22 1557  BP: (!) 146/72 134/76  Pulse: 84   Temp: (!) 97.3 F (36.3 C)   TempSrc: Temporal   SpO2: 97%   Weight: 150 lb (68 kg)   Height: 5\' 3"  (1.6 m)    Body mass index is 26.57 kg/m.   General: Well developed, well nourished. No acute distress. Psych: Alert and oriented. Normal mood and affect.  Health Maintenance Due  Topic Date Due   DTaP/Tdap/Td (1 - Tdap) Never done   Zoster Vaccines- Shingrix (2 of 2) 03/29/2011   Lab Results Component Ref Range & Units 15:35 (11/05/22)  Color, UA yellow  Clarity, UA cloudy  Glucose, UA Negative Negative  Bilirubin, UA neg  Ketones, UA neg  Spec Grav, UA 1.010 - 1.025 1.015  Blood, UA 10 ery/ul  pH, UA 5.0 - 8.0 6.0  Protein, UA Negative Positive Abnormal   Comment: 2+  Urobilinogen, UA 0.2 or 1.0 E.U./dL 0.2  Nitrite, UA neg  Leukocytes, UA Negative Large (3+) Abnormal       Assessment & Plan:   Problem List Items Addressed This Visit   None Visit Diagnoses     Acute cystitis without hematuria    -  Primary   I iwll repeat a 7-day course of nitrofurantoin. I will have her come 2-3 days after she finishes her antibiotics to repeat a UA and culture.   Relevant Medications   nitrofurantoin, macrocrystal-monohydrate, (MACROBID) 100 MG capsule   Other Relevant Orders   POCT Urinalysis Dipstick (Completed)   Urine Culture   Urinalysis, Routine w reflex microscopic       Return if symptoms worsen or fail to improve.   Loyola Mast, MD

## 2022-11-16 ENCOUNTER — Other Ambulatory Visit: Payer: Medicare Other

## 2022-11-16 DIAGNOSIS — N3000 Acute cystitis without hematuria: Secondary | ICD-10-CM

## 2022-11-16 DIAGNOSIS — N390 Urinary tract infection, site not specified: Secondary | ICD-10-CM

## 2022-11-18 LAB — URINALYSIS, ROUTINE W REFLEX MICROSCOPIC
Bacteria, UA: NONE SEEN /HPF
Bilirubin Urine: NEGATIVE
Glucose, UA: NEGATIVE
Hgb urine dipstick: NEGATIVE
Ketones, ur: NEGATIVE
Nitrite: NEGATIVE
RBC / HPF: NONE SEEN /[HPF] (ref 0–2)
Specific Gravity, Urine: 1.025 (ref 1.001–1.035)
pH: 5.5 (ref 5.0–8.0)

## 2022-11-18 LAB — MICROSCOPIC MESSAGE

## 2022-11-18 LAB — URINE CULTURE
MICRO NUMBER:: 15644526
SPECIMEN QUALITY:: ADEQUATE

## 2022-11-19 MED ORDER — NITROFURANTOIN MACROCRYSTAL 50 MG PO CAPS
50.0000 mg | ORAL_CAPSULE | Freq: Every day | ORAL | 1 refills | Status: DC
Start: 2022-11-19 — End: 2023-10-16

## 2022-11-19 NOTE — Addendum Note (Signed)
Addended by: Loyola Mast on: 11/19/2022 08:10 AM   Modules accepted: Orders

## 2022-12-28 ENCOUNTER — Other Ambulatory Visit: Payer: Self-pay | Admitting: Family Medicine

## 2022-12-31 ENCOUNTER — Other Ambulatory Visit: Payer: Self-pay | Admitting: Cardiovascular Disease

## 2023-01-01 ENCOUNTER — Ambulatory Visit: Payer: Medicare Other | Admitting: Family Medicine

## 2023-01-08 ENCOUNTER — Ambulatory Visit (INDEPENDENT_AMBULATORY_CARE_PROVIDER_SITE_OTHER): Payer: Medicare Other | Admitting: Family Medicine

## 2023-01-08 ENCOUNTER — Telehealth: Payer: Self-pay | Admitting: Family Medicine

## 2023-01-08 ENCOUNTER — Encounter: Payer: Self-pay | Admitting: Family Medicine

## 2023-01-08 VITALS — BP 154/86 | HR 72 | Temp 98.0°F | Ht 63.0 in | Wt 149.4 lb

## 2023-01-08 DIAGNOSIS — E039 Hypothyroidism, unspecified: Secondary | ICD-10-CM | POA: Diagnosis not present

## 2023-01-08 DIAGNOSIS — I10 Essential (primary) hypertension: Secondary | ICD-10-CM

## 2023-01-08 DIAGNOSIS — Z794 Long term (current) use of insulin: Secondary | ICD-10-CM

## 2023-01-08 DIAGNOSIS — M0609 Rheumatoid arthritis without rheumatoid factor, multiple sites: Secondary | ICD-10-CM

## 2023-01-08 DIAGNOSIS — E1142 Type 2 diabetes mellitus with diabetic polyneuropathy: Secondary | ICD-10-CM

## 2023-01-08 DIAGNOSIS — N760 Acute vaginitis: Secondary | ICD-10-CM

## 2023-01-08 DIAGNOSIS — N1832 Chronic kidney disease, stage 3b: Secondary | ICD-10-CM | POA: Diagnosis not present

## 2023-01-08 DIAGNOSIS — S78112A Complete traumatic amputation at level between left hip and knee, initial encounter: Secondary | ICD-10-CM

## 2023-01-08 DIAGNOSIS — E785 Hyperlipidemia, unspecified: Secondary | ICD-10-CM | POA: Diagnosis not present

## 2023-01-08 DIAGNOSIS — N390 Urinary tract infection, site not specified: Secondary | ICD-10-CM

## 2023-01-08 LAB — URINALYSIS, ROUTINE W REFLEX MICROSCOPIC
Bilirubin Urine: NEGATIVE
Hgb urine dipstick: NEGATIVE
Ketones, ur: NEGATIVE
Nitrite: NEGATIVE
Specific Gravity, Urine: 1.02 (ref 1.000–1.030)
Total Protein, Urine: 100 — AB
Urine Glucose: NEGATIVE
Urobilinogen, UA: 0.2 (ref 0.0–1.0)
pH: 6 (ref 5.0–8.0)

## 2023-01-08 LAB — CBC
HCT: 42.9 % (ref 36.0–46.0)
Hemoglobin: 14.4 g/dL (ref 12.0–15.0)
MCHC: 33.5 g/dL (ref 30.0–36.0)
MCV: 89.2 fL (ref 78.0–100.0)
Platelets: 322 10*3/uL (ref 150.0–400.0)
RBC: 4.81 Mil/uL (ref 3.87–5.11)
RDW: 14.1 % (ref 11.5–15.5)
WBC: 8 10*3/uL (ref 4.0–10.5)

## 2023-01-08 LAB — MICROALBUMIN / CREATININE URINE RATIO
Creatinine,U: 73.7 mg/dL
Microalb Creat Ratio: 168.6 mg/g — ABNORMAL HIGH (ref 0.0–30.0)
Microalb, Ur: 124.2 mg/dL — ABNORMAL HIGH (ref 0.0–1.9)

## 2023-01-08 LAB — BASIC METABOLIC PANEL
BUN: 27 mg/dL — ABNORMAL HIGH (ref 6–23)
CO2: 24 meq/L (ref 19–32)
Calcium: 9 mg/dL (ref 8.4–10.5)
Chloride: 102 meq/L (ref 96–112)
Creatinine, Ser: 1.42 mg/dL — ABNORMAL HIGH (ref 0.40–1.20)
GFR: 35.02 mL/min — ABNORMAL LOW (ref >60.00)
Glucose, Bld: 157 mg/dL — ABNORMAL HIGH (ref 70–99)
Potassium: 4.5 meq/L (ref 3.5–5.1)
Sodium: 136 meq/L (ref 135–145)

## 2023-01-08 LAB — LIPID PANEL
Cholesterol: 108 mg/dL (ref 0–200)
HDL: 41 mg/dL (ref >39.00)
LDL Cholesterol: 15 mg/dL (ref 0–99)
NonHDL: 66.82
Total CHOL/HDL Ratio: 3
Triglycerides: 260 mg/dL — ABNORMAL HIGH (ref 0.0–149.0)
VLDL: 52 mg/dL — ABNORMAL HIGH (ref 0.0–40.0)

## 2023-01-08 LAB — HEMOGLOBIN A1C: Hgb A1c MFr Bld: 7.6 % — ABNORMAL HIGH (ref 4.6–6.5)

## 2023-01-08 LAB — PHOSPHORUS: Phosphorus: 3.8 mg/dL (ref 2.3–4.6)

## 2023-01-08 LAB — TSH: TSH: 0.45 u[IU]/mL (ref 0.35–5.50)

## 2023-01-08 MED ORDER — FLUCONAZOLE 150 MG PO TABS: 150.0000 mg | ORAL_TABLET | Freq: Once | ORAL | 0 refills | Status: AC

## 2023-01-08 NOTE — Assessment & Plan Note (Signed)
I will check her TSH today. Continue levothyroxine 125 mcg daily.

## 2023-01-08 NOTE — Assessment & Plan Note (Signed)
I will check annual DM labs today. Continue glipizide 10 mg, semaglutide (Ozempic) 0.5 mg weekly, and insulin glargine 25 units bid (increased at last visit).

## 2023-01-08 NOTE — Progress Notes (Signed)
[facility] [address] [place] [address] Dept: [phone] Dept Fax: [phone]  Chronic Care Office Visit  Subjective:    Patient ID: [name], female    DOB: [birth year], 82 y.o..   MRN: [id number]  Chief Complaint  Patient presents with   Diabetes    3 month f/u DM/HTN.  Fasting today.    Having itching in vaginal area.    History of Present Illness:  Patient is in today for reassessment of chronic medical issues.  [name] has a history of peripheral artery disease. 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  She now has a prosthesis. She is ambulating with either a wheeled-walker or a cane in most situations. She has severe PAD of the right leg, which is being followed by vascular surgery. She had a new stent placed in June.   [name] has a past history of aortic stenosis and coronary artery disease. She had CABG x 2 and aortic valve replacement in 04/2020. She also has hypertension and hyperlipidemia.  She is managed on aspirin 81 mg daily, clopidogrel 75 mg daily, metoprolol tartrate 50 mg bid, and amlodipine 10 mg daily. She has been intolerant of statins and is on Repatha and Vascepa.   [name] has Type 2 diabetes.She is managed on glipizide 10 mg, semaglutide (Ozempic) 0.5 mg weekly, and insulin glargine 25 units bid. Her diabetes is complicated with peripheral neuropathy.   [name] has a history of hypothyroidism. She is managed on levothyroxine 125 mcg daily.    [name] has a history of rheumatoid arthritis and is on periodic infusions of Simponi. She also has erosive osteoarthritis of the hands. She feels she is managing with the discomfort by taking Tylenol.   [name] has had recurrent UTIs. She is currently on a 6 month course of nitrofurantoin 50 mg daily to see if we can resolve this chronic issue. Since being on the  antibiotics, she has noted some vaginal itching.  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   Primary insomnia 11/02/2020   Irritation of eyelid 07/26/2020   S/P aortic valve replacement with bioprosthetic valve 05/04/2020   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   Peripheral artery disease (HCC)    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) 04/07/2019   Scoliosis deformity of spine 02/03/2019   Osteoarthritis of hip 11/17/2018   Claudication (HCC)    Recurrent UTI 07/21/2018   Chronic headaches 11/12/2017   Degeneration of lumbar intervertebral disc 06/13/2017   Stage 3b chronic kidney disease (CKD) (HCC)    Mass of left adrenal gland (HCC) 04/19/2017   Spinal stenosis of lumbar region 10/20/2014   Hyperlipidemia 03/16/2013   Dense breasts 12/22/2012   Anxiety state 04/16/2012   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/16/2012   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   Osteoarthritis of knee 04/16/2012   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 female breast (HCC) 12/20/2010   History of breast cancer 12/05/2010   Depression 09/14/2008   GERD 09/14/2008   Type  2 diabetes mellitus with neurologic complication, with long-term current use of insulin (HCC) 05/20/2008   Essential hypertension 04/17/2007   Coronary atherosclerosis 04/17/2007   Hypothyroidism 04/03/2007   Past Surgical History:  Procedure Laterality Date   ABDOMINAL AORTOGRAM W/LOWER EXTREMITY Bilateral 04/29/2019   Procedure: ABDOMINAL AORTOGRAM W/LOWER EXTREMITY;  Surgeon: Cephus Shelling, MD;  Location: [facility];   Service: Cardiovascular;  Laterality: Bilateral;   ABDOMINAL AORTOGRAM W/LOWER EXTREMITY Left 07/01/2019   Procedure: ABDOMINAL AORTOGRAM W/ Left LOWER EXTREMITY [place];  Surgeon: Cephus Shelling, MD;  Location: [facility];  Service: Cardiovascular;  Laterality: Left;   ABDOMINAL AORTOGRAM W/LOWER EXTREMITY Left 03/03/2021   Procedure: ABDOMINAL AORTOGRAM W/LOWER EXTREMITY;  Surgeon: Leonie Douglas, MD;  Location: [facility];  Service: Cardiovascular;  Laterality: Left;   ABDOMINAL AORTOGRAM W/LOWER EXTREMITY Right 06/12/2021   Procedure: ABDOMINAL AORTOGRAM W/LOWER EXTREMITY;  Surgeon: Maeola Harman, MD;  Location[facility];  Service: Cardiovascular;  Laterality: Right;  LOWER LEG   ABDOMINAL AORTOGRAM W/LOWER EXTREMITY N/A 03/07/2022   Procedure: ABDOMINAL AORTOGRAM W/LOWER EXTREMITY;  Surgeon: Victorino Sparrow, MD;  Location: [facility] INVASIVE CV LAB;  Service: Cardiovascular;  Laterality: N/A;   ABDOMINAL AORTOGRAM W/LOWER EXTREMITY N/A 07/18/2022   Procedure: ABDOMINAL AORTOGRAM W/LOWER EXTREMITY;  Surgeon: Victorino Sparrow, MD;  Location: [facility] INVASIVE CV LAB;  Service: Cardiovascular;  Laterality: N/A;   AMPUTATION Left 03/06/2021   Procedure: LEFT ABOVE KNEE AMPUTATION;  Surgeon: Victorino Sparrow, MD;  Location: [facility] OR;  Service: Vascular;  Laterality: Left;   ANTERIOR AND POSTERIOR REPAIR N/A 04/05/2016   Procedure: ANTERIOR (CYSTOCELE) AND POSTERIOR REPAIR (RECTOCELE);  Surgeon: Zelphia Cairo, MD;  Location: [facility];  Service: Gynecology;  Laterality: N/A;   AORTIC VALVE REPLACEMENT N/A 05/04/2020   Procedure: AORTIC VALVE REPLACEMENT (AVR) USING INSPIRIS RESILIA AORTIC VALVE;  Surgeon: Purcell Nails, MD;  Location: [facility] OR;  Service: Open Heart Surgery;  Laterality: N/A;   BREAST BIOPSY     BREAST LUMPECTOMY  1983   benign biopsy   BREAST LUMPECTOMY Right 12/29/2010   snbx, ER/PR +, Her2 -, 0/1 node pos.   CARPAL TUNNEL RELEASE Bilateral  9/12,8,12   CATARACT EXTRACTION Right 2024   CHOLECYSTECTOMY     COLONOSCOPY N/A 02/09/2013   Procedure: COLONOSCOPY;  Surgeon: Hart Carwin, MD;  Location: [facility];  Service: Endoscopy;  Laterality: N/A;   COLONOSCOPY     CORONARY ANGIOPLASTY     CORONARY ARTERY BYPASS GRAFT N/A 05/04/2020   Procedure: CORONARY ARTERY BYPASS GRAFTING (CABG), ON PUMP, TIMES TWO, USING LEFT INTERNAL MAMMARY ARTERY AND RIGHT ENDOSCOPICALLY HARVESTED GREATER SAPHENOUS VEIN;  Surgeon: Purcell Nails, MD;  Location: [facility] OR;  Service: Open Heart Surgery;  Laterality: N/A;   DILATION AND CURETTAGE OF UTERUS     ESOPHAGOGASTRODUODENOSCOPY N/A 02/09/2013   Procedure: ESOPHAGOGASTRODUODENOSCOPY (EGD);  Surgeon: Hart Carwin, MD;  Location: [facility];  Service: Endoscopy;  Laterality: N/A;   FOOT SPURS     HYSTEROSCOPY WITH D & C N/A 09/11/2012   Procedure: DILATATION AND CURETTAGE ;  Surgeon: Zelphia Cairo, MD;  Location: [facility];  Service: Gynecology;  Laterality: N/A;   KNEE SURGERY  2007   LAPAROSCOPIC ASSISTED VAGINAL HYSTERECTOMY N/A 04/05/2016   Procedure: LAPAROSCOPIC ASSISTED VAGINAL HYSTERECTOMY possible BSO;  Surgeon: Zelphia Cairo, MD;  Location: [facility];  Service: Gynecology;  Laterality: N/A;   LUMBAR LAMINECTOMY/DECOMPRESSION MICRODISCECTOMY N/A 10/20/2014   Procedure: MICRO LUMBER DECOMPRESSION  L3-4 L4-5;  Surgeon: Jene Every, MD;  Location: [facility];  Service: Orthopedics;  Laterality: N/A;   LUMBAR LAMINECTOMY/DECOMPRESSION MICRODISCECTOMY Bilateral 10/13/2015   Procedure: MICRO LUMBAR DECOMPRESSION L5 - S1 AND REDO DECOMPRESSION L4 - L5 AND REMOVAL OF FACET CYST L4 - L5 2 LEVELS;  Surgeon: Jene Every, MD;  Location: [facility];  Service: Orthopedics;  Laterality: Bilateral;   PERIPHERAL VASCULAR ATHERECTOMY Right 06/12/2021   Procedure: PERIPHERAL VASCULAR ATHERECTOMY;  Surgeon: Maeola Harman, MD;  Location: [facility] INVASIVE CV LAB;  Service: Cardiovascular;  Laterality: Right;   SFA/POP/AT   PERIPHERAL VASCULAR INTERVENTION Right 04/29/2019   Procedure: PERIPHERAL VASCULAR INTERVENTION;  Surgeon: Cephus Shelling, MD;  Location: [facility];  Service: Cardiovascular;  Laterality: Right;   PERIPHERAL VASCULAR INTERVENTION Right 06/12/2021   Procedure: PERIPHERAL VASCULAR INTERVENTION;  Surgeon: Maeola Harman, MD;  Location: [facility] INVASIVE CV LAB;  Service: Cardiovascular;  Laterality: Right;  SFP/POPLITEAL/AT   PERIPHERAL VASCULAR INTERVENTION  03/07/2022   Procedure: PERIPHERAL VASCULAR INTERVENTION;  Surgeon: Victorino Sparrow, MD;  Location: [facility] INVASIVE CV LAB;  Service: Cardiovascular;;   PERIPHERAL VASCULAR INTERVENTION Right 07/18/2022   Procedure: PERIPHERAL VASCULAR INTERVENTION;  Surgeon: Victorino Sparrow, MD;  Location: [facility] INVASIVE CV LAB;  Service: Cardiovascular;  Laterality: Right;   PORT-A-CATH REMOVAL  04/17/2011   Procedure: REMOVAL PORT-A-CATH;  Surgeon: Emelia Loron, MD;  Location: [facility];  Service: General;  Laterality: Left;   PORTACATH PLACEMENT  02/07/2011   Procedure: INSERTION PORT-A-CATH;  Surgeon: Emelia Loron, MD;  Location: [facility];  Service: General;  Laterality: N/A;   RIGHT/LEFT HEART CATH AND CORONARY ANGIOGRAPHY N/A 03/17/2020   Procedure: RIGHT/LEFT HEART CATH AND CORONARY ANGIOGRAPHY;  Surgeon: Kathleene Hazel, MD;  Location: [facility];  Service: Cardiovascular;  Laterality: N/A;   TEE WITHOUT CARDIOVERSION N/A 05/04/2020   Procedure: TRANSESOPHAGEAL ECHOCARDIOGRAM (TEE);  Surgeon: Purcell Nails, MD;  Location: [facility] OR;  Service: Open Heart Surgery;  Laterality: N/A;   UPPER GASTROINTESTINAL ENDOSCOPY     Family History  Problem Relation Age of Onset   Kidney disease Mother    Heart disease Mother    Throat cancer Father    Alcoholism Father    Heart attack Father    Lymphoma Brother 15   Heart attack Brother    Diabetes Brother    Hypertension Brother    Cancer  Son    Colon cancer Neg Hx    Stroke Neg Hx    Esophageal cancer Neg Hx    Rectal cancer Neg Hx    Stomach cancer Neg Hx    Outpatient Medications Prior to Visit  Medication Sig Dispense Refill   Accu-Chek FastClix Lancets MISC USE TO CHECK BLOOD SUGARS 3 TIMES DAILY 306 each 3   amLODipine (NORVASC) 10 MG tablet Take 1 tablet (10 mg total) by mouth daily. 90 tablet 2   aspirin EC 81 MG tablet Take 81 mg by mouth in the morning.     blood glucose meter kit and supplies Dispense based on patient and insurance preference. Use up to four times daily as directed. (FOR ICD-10 E10.9, E11.9). 1 each 0   Cholecalciferol 125 MCG (5000 UT) capsule Take 5,000 Units by mouth daily.     clobetasol ointment (TEMOVATE) 0.05 % APPLY A THIN LAYER TO THE AFFECTED AREA(S) BY TOPICAL ROUTE 2 TIMES PER DAY     clopidogrel (PLAVIX) 75 MG tablet TAKE 1 TABLET(75 MG) BY MOUTH DAILY 30  tablet 11   gatifloxacin (ZYMAXID) 0.5 % SOLN SMARTSIG:In Eye(s)     glipiZIDE (GLUCOTROL XL) 10 MG 24 hr tablet TAKE 1 TABLET BY MOUTH DAILY  WITH BREAKFAST 90 tablet 3   glucose blood (ACCU-CHEK GUIDE) test strip TEST ONCE DAILY 100 each 5   Golimumab (SIMPONI ARIA IV) Inject into the vein every 2 (two) months.     insulin glargine (LANTUS SOLOSTAR) 100 UNIT/ML Solostar Pen Inject 25 Units into the skin 2 (two) times daily. 15 mL 3   Insulin Pen Needle (CAREFINE PEN NEEDLES) 32G X 6 MM MISC 1 application by Does not apply route 2 (two) times daily. 120 each 1   ketorolac (ACULAR) 0.5 % ophthalmic solution SMARTSIG:In Eye(s)     levothyroxine (SYNTHROID) 125 MCG tablet TAKE 1 TABLET BY MOUTH DAILY 90 tablet 3   metoprolol tartrate (LOPRESSOR) 50 MG tablet TAKE 1 TABLET BY MOUTH TWICE  DAILY 180 tablet 1   nitrofurantoin (MACRODANTIN) 50 MG capsule Take 1 capsule (50 mg total) by mouth daily. 90 capsule 1   nitrofurantoin, macrocrystal-monohydrate, (MACROBID) 100 MG capsule Take 1 capsule (100 mg total) by mouth 2 (two) times daily.  14 capsule 0   OZEMPIC, 0.25 OR 0.5 MG/DOSE, 2 MG/3ML SOPN INJECT SUBCUTANEOUSLY 0.5 MG  WEEKLY 9 mL 3   pantoprazole (PROTONIX) 40 MG tablet TAKE 1 TABLET BY MOUTH DAILY 90 tablet 3   PARoxetine (PAXIL) 20 MG tablet TAKE 1 TABLET BY MOUTH IN THE  MORNING 90 tablet 3   prednisoLONE acetate (PRED FORTE) 1 % ophthalmic suspension SMARTSIG:In Eye(s)     REPATHA SURECLICK 140 MG/ML SOAJ INJECT 1 PEN INTO THE SKIN EVERY 14 DAYS 2 mL 11   Semaglutide,0.25 or 0.5MG /DOS, 2 MG/1.5ML SOPN Inject 0.5 mg into the skin every Tuesday. 3 mL 6   valACYclovir (VALTREX) 1000 MG tablet Take 1 tablet (1,000 mg total) by mouth daily as needed (Fever Blisters). 180 tablet 0   No facility-administered medications prior to visit.   Allergies  Allergen Reactions   Codeine Nausea And Vomiting and Other (See Comments)    Severe stomach cramps, also Other reaction(s): Unknown   Antihistamines, Diphenhydramine-Type Other (See Comments)    Causes hyperactivity   Diphenhydramine     Other reaction(s): Unknown   Erythromycin Hives, Rash and Other (See Comments)    Allergic due to dental work from 50 years ago. It was used in packing and resulted in rash/hives inside and outside of mouth.   Folic Acid Other (See Comments)    Other reaction(s): tongue pain   Gabapentin     Urinary incontinence   Lyrica [Pregabalin]     Double vision/falls   Methotrexate Other (See Comments)    Other reaction(s): oral ulcers   Oxycodone     Hallucinations/crazy feelings   Rheumate [Fish Oil]     Other reaction(s): hypoglycemia   Statins Other (See Comments)    Joint pains Other reaction(s): Unknown   Sulfa Antibiotics Other (See Comments)    Reaction not recalled   Trulicity [Dulaglutide] Itching   Objective:   Today's Vitals   01/08/23 0833  BP: (!) 152/76  Pulse: 72  Temp: 98 F (36.7 C)  TempSrc: Temporal  SpO2: 99%  Weight: 149 lb 6.4 oz (67.8 kg)  Height: 5\' 3"  (1.6 m)   Body mass index is 26.47 kg/m.    General: Well developed, well nourished. No acute distress. Psych: Alert and oriented. Normal mood and affect.  Health Maintenance Due  Topic Date Due   DTaP/Tdap/Td (1 - Tdap) Never done   Zoster Vaccines- Shingrix (2 of 2) 03/29/2011   Diabetic kidney evaluation - eGFR measurement  01/02/2023   Diabetic kidney evaluation - Urine ACR  01/02/2023     Assessment & Plan:   Problem List Items Addressed This Visit       Cardiovascular and Mediastinum   Essential hypertension - Primary   Blood pressure is acceptable. Continue amlodipine 10 mg daily and metoprolol tartrate 50 mg bid.      Relevant Orders   Basic metabolic panel     Endocrine   Hypothyroidism (Chronic)   I will check her TSH today. Continue levothyroxine 125 mcg daily.      Relevant Orders   TSH   Type 2 diabetes mellitus with neurologic complication, with long-term current use of insulin (HCC)   I will check annual DM labs today. Continue glipizide 10 mg, semaglutide (Ozempic) 0.5 mg weekly, and insulin glargine 25 units bid (increased at last visit).      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 Following with rheumatology.        Genitourinary   Recurrent UTI   Currently on a 71-month regimen of nitrofurantoin 50 mg daily.      Relevant Medications   fluconazole (DIFLUCAN) 150 MG tablet   Stage 3b chronic kidney disease (CKD) (HCC)   Stable. Continue focus on blood pressure and glucose control, adequate hydration, and avoidance of nephrotoxic medications. I will check annual labs today.       Relevant Orders   Microalbumin / creatinine urine ratio   Basic metabolic panel   Urinalysis, Routine w reflex microscopic   Phosphorus   CBC      Other   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HCC)   Stable. New prosthesis working well.      Hyperlipidemia   Continue Repatha and Vascepa 1 gm, 2 caps bid. I will check lipids today.      Relevant Orders   Lipid panel   Other Visit Diagnoses       Acute vaginitis       Likely has a yeast infection related to antibiotic use. I will prescribe Diflucan. I will have her keep a few doses for PRN use if itching returns.       Return in about 3 months (around 04/08/2023) for Reassessment.   Loyola Mast, MD

## 2023-01-08 NOTE — Telephone Encounter (Signed)
Pt was prescribed fluconazole (DIFLUCAN) today 01/08/23 by Dr Veto Kemps. She has read it can have an adverse reaction if taking Paxil. She is taking the generic form of Paxil. Please advise pt at [phone]

## 2023-01-08 NOTE — Assessment & Plan Note (Signed)
Stable. New prosthesis working well.

## 2023-01-08 NOTE — Assessment & Plan Note (Signed)
Continue golimumab (Simponi) infusions. Following with rheumatology.

## 2023-01-08 NOTE — Assessment & Plan Note (Addendum)
Stable. Continue focus on blood pressure and glucose control, adequate hydration, and avoidance of nephrotoxic medications. I will check annual labs today.

## 2023-01-08 NOTE — Assessment & Plan Note (Signed)
Blood pressure is acceptable. Continue amlodipine 10 mg daily and metoprolol tartrate 50 mg bid.

## 2023-01-08 NOTE — Assessment & Plan Note (Signed)
Currently on a 1-month regimen of nitrofurantoin 50 mg daily.

## 2023-01-08 NOTE — Assessment & Plan Note (Signed)
Continue Repatha and Vascepa 1 gm, 2 caps bid. I will check lipids today.

## 2023-01-08 NOTE — Telephone Encounter (Signed)
Patient notified VIA phone that it is okay to take per providers recommendations. Dm/cma

## 2023-01-11 NOTE — Telephone Encounter (Signed)
Copied from CRM [id number]. Topic: Clinical - Lab/Test Results >> Jan 11, 2023  1:00 PM [name] wrote: Reason for CRM: Patient called and stated she was here last Tuesday and had blood work and would like to know what the report states, would prefer to speak with Dr. Veto Kemps nurse.  Patient notified VIA phone regarding results and recommendations.  No questions. Dm/cma

## 2023-01-25 ENCOUNTER — Telehealth: Payer: Self-pay

## 2023-01-25 ENCOUNTER — Other Ambulatory Visit: Payer: Self-pay | Admitting: Family Medicine

## 2023-01-25 DIAGNOSIS — K219 Gastro-esophageal reflux disease without esophagitis: Secondary | ICD-10-CM

## 2023-01-25 NOTE — Telephone Encounter (Signed)
 Lft VM to rtn call on Monday. Dm/cma

## 2023-01-25 NOTE — Telephone Encounter (Signed)
 Copied from CRM [id number]. Topic: Clinical - Medication Question >> Jan 25, 2023  8:35 AM Pinkey ORN wrote: Reason for CRM: [name] is requesting a call back from [place], in reference to the antibiotic Dr. Garnette Simpler has her on. Call back number [phone]  Called patient and she reports that the Fluconazole  is making her itch.  She stopped taking it and the itching has stopped.   She was supposed to take it for 6 months daily and had only taken 54 days worth.  Do you want to try another medication and wants to know what if her urine was better this last time? Please review and advise.  Thanks. Dm/cma

## 2023-01-28 NOTE — Telephone Encounter (Signed)
 Patient notified VIA phone. Dm/cma

## 2023-01-29 ENCOUNTER — Other Ambulatory Visit: Payer: Self-pay | Admitting: Cardiovascular Disease

## 2023-01-29 DIAGNOSIS — E782 Mixed hyperlipidemia: Secondary | ICD-10-CM

## 2023-01-29 DIAGNOSIS — I739 Peripheral vascular disease, unspecified: Secondary | ICD-10-CM

## 2023-01-29 DIAGNOSIS — I2510 Atherosclerotic heart disease of native coronary artery without angina pectoris: Secondary | ICD-10-CM

## 2023-02-20 NOTE — Progress Notes (Unsigned)
HISTORY AND PHYSICAL     CC:  follow up. Requesting Provider:  Loyola Mast, MD  HPI: This is a 83 y.o. female who is here today for follow up for PAD.  Pt has hx of aortogram with balloon angioplasty of the right ATA, popliteal and SFA stents and drug eluting stent within previous ATA stent on 07/18/2022 by Dr. Karin Lieu.  (Vascular surgery hx as below)  Vascular surgery hx: -aortogram with right SFA angioplasty and stent placement 04/29/2019 by Dr. Chestine Spore for lifestyle limiting claudication.   -aortogram for LLE on 07/01/2019 (Dr. Chestine Spore) and 03/03/2021 (Dr. 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 Karin Lieu.   -aortogram with laser atherectomy of instent restenosis right SFA, occluded right popliteal and ATA, drug coated balloon angioplasty right SFA in stent restenosis and stent of right SFA and popliteal and right ATA on 06/12/2021 by Dr. Randie Heinz.   -aortogram with balloon angioplasty of previous SFA and popliteal stents and ATA and popliteal stents on 03/07/2022 by Dr. Karin Lieu.   -aortogram with balloon angioplasty of the right ATA, popliteal and SFA stents and drug eluting stent within previous ATA stent on 07/18/2022 by Dr. Karin Lieu.   The pt returns today for follow up and here with her husband. They have been married 59 years.  She states she has been doing well.  She denies any claudication or rest pain or non healing wounds.  She states that she feels her stent in her calf every once in a while. She states she is taking prescription fish oil and continues to tolerate the repatha.  No prosthesis issues.    The pt is on a statin for cholesterol management.   Repatha The pt is on an aspirin.    Other AC:  plavix The pt is on CCB, BB for hypertension.  The pt is  on medication for diabetes. 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, inv mammary, in situ,ER/PR +,HER2 -   Cancer (HCC)    Carcinoma of breast treated with adjuvant chemotherapy (HCC)    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HCC)    Claudication (HCC)    Coronary artery disease    stent 2007   Depression    Diabetes mellitus    Difficulty sleeping    Diverticulosis 12/10/03   Elevated cholesterol    Generalized weakness    GERD (gastroesophageal reflux disease)    Heart murmur    Hepatitis    as an infant   HSV-1 (herpes simplex virus 1) infection    Hyperlipidemia    Hypertension    Hypothyroidism    Osteoarthritis    PAD (peripheral artery disease) (HCC)    Personal history of chemotherapy    Personal history of radiation therapy    Pneumonia    2014 september and March 2022   Rheumatoid arthritis (HCC)    S/P aortic valve replacement with bioprosthetic valve 05/04/2020   Edwards Inspiris Resilia stented bovine pericardial tissue valve, size 23 mm   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   LIMA to D3, SVG to PDA, EVH via right thigh   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  Dr Charna Elizabeth   Spinal stenosis    Thyroid disease     Past Surgical History:  Procedure Laterality Date   ABDOMINAL AORTOGRAM W/LOWER EXTREMITY Bilateral 04/29/2019   Procedure: ABDOMINAL AORTOGRAM W/LOWER EXTREMITY;  Surgeon: Cephus Shelling, MD;  Location: [facility] INVASIVE CV LAB;  Service: Cardiovascular;  Laterality: Bilateral;   ABDOMINAL AORTOGRAM W/LOWER EXTREMITY Left 07/01/2019   Procedure: ABDOMINAL AORTOGRAM W/ Left LOWER EXTREMITY [place];  Surgeon: Cephus Shelling, MD;  Location: [facility] INVASIVE CV LAB;  Service: Cardiovascular;  Laterality: Left;   ABDOMINAL AORTOGRAM W/LOWER EXTREMITY Left 03/03/2021   Procedure: ABDOMINAL AORTOGRAM W/LOWER EXTREMITY;  Surgeon: Leonie Douglas, MD;  Location: [facility] INVASIVE CV LAB;  Service: Cardiovascular;  Laterality: Left;   ABDOMINAL  AORTOGRAM W/LOWER EXTREMITY Right 06/12/2021   Procedure: ABDOMINAL AORTOGRAM W/LOWER EXTREMITY;  Surgeon: Maeola Harman, MD;  Location: [facility] INVASIVE CV LAB;  Service: Cardiovascular;  Laterality: Right;  LOWER LEG   ABDOMINAL AORTOGRAM W/LOWER EXTREMITY N/A 03/07/2022   Procedure: ABDOMINAL AORTOGRAM W/LOWER EXTREMITY;  Surgeon: Victorino Sparrow, MD;  Location: [facility] INVASIVE CV LAB;  Service: Cardiovascular;  Laterality: N/A;   ABDOMINAL AORTOGRAM W/LOWER EXTREMITY N/A 07/18/2022   Procedure: ABDOMINAL AORTOGRAM W/LOWER EXTREMITY;  Surgeon: Victorino Sparrow, MD;  Location: [facility] INVASIVE CV LAB;  Service: Cardiovascular;  Laterality: N/A;   AMPUTATION Left 03/06/2021   Procedure: LEFT ABOVE KNEE AMPUTATION;  Surgeon: Victorino Sparrow, MD;  Location: [facility] OR;  Service: Vascular;  Laterality: Left;   ANTERIOR AND POSTERIOR REPAIR N/A 04/05/2016   Procedure: ANTERIOR (CYSTOCELE) AND POSTERIOR REPAIR (RECTOCELE);  Surgeon: Zelphia Cairo, MD;  Location: [facility] ORS;  Service: Gynecology;  Laterality: N/A;   AORTIC VALVE REPLACEMENT N/A 05/04/2020   Procedure: AORTIC VALVE REPLACEMENT (AVR) USING INSPIRIS RESILIA AORTIC VALVE;  Surgeon: Purcell Nails, MD;  Location: [facility] OR;  Service: Open Heart Surgery;  Laterality: N/A;   BREAST BIOPSY     BREAST LUMPECTOMY  1983   benign biopsy   BREAST LUMPECTOMY Right 12/29/2010   snbx, ER/PR +, Her2 -, 0/1 node pos.   CARPAL TUNNEL RELEASE Bilateral 9/12,8,12   CATARACT EXTRACTION Right 2024   CHOLECYSTECTOMY     COLONOSCOPY N/A 02/09/2013   Procedure: COLONOSCOPY;  Surgeon: Hart Carwin, MD;  Location: [facility] ENDOSCOPY;  Service: Endoscopy;  Laterality: N/A;   COLONOSCOPY     CORONARY ANGIOPLASTY     CORONARY ARTERY BYPASS GRAFT N/A 05/04/2020   Procedure: CORONARY ARTERY BYPASS GRAFTING (CABG), ON PUMP, TIMES TWO, USING LEFT INTERNAL MAMMARY ARTERY AND RIGHT ENDOSCOPICALLY HARVESTED GREATER SAPHENOUS VEIN;  Surgeon: Purcell Nails, MD;  Location: [facility]  OR;  Service: Open Heart Surgery;  Laterality: N/A;   DILATION AND CURETTAGE OF UTERUS     ESOPHAGOGASTRODUODENOSCOPY N/A 02/09/2013   Procedure: ESOPHAGOGASTRODUODENOSCOPY (EGD);  Surgeon: Hart Carwin, MD;  Location: [facility];  Service: Endoscopy;  Laterality: N/A;   FOOT SPURS     HYSTEROSCOPY WITH D & C N/A 09/11/2012   Procedure: DILATATION AND CURETTAGE ;  Surgeon: Zelphia Cairo, MD;  Location: [facility] ORS;  Service: Gynecology;  Laterality: N/A;   KNEE SURGERY  2007   LAPAROSCOPIC ASSISTED VAGINAL HYSTERECTOMY N/A 04/05/2016   Procedure: LAPAROSCOPIC ASSISTED VAGINAL HYSTERECTOMY possible BSO;  Surgeon: Zelphia Cairo, MD;  Location: [facility] ORS;  Service: Gynecology;  Laterality: N/A;   LUMBAR LAMINECTOMY/DECOMPRESSION MICRODISCECTOMY N/A 10/20/2014   Procedure: MICRO LUMBER DECOMPRESSION L3-4 L4-5;  Surgeon: Jene Every, MD;  Location: [facility] ORS;  Service: Orthopedics;  Laterality: N/A;   LUMBAR LAMINECTOMY/DECOMPRESSION MICRODISCECTOMY Bilateral 10/13/2015   Procedure: MICRO LUMBAR DECOMPRESSION L5 - S1 AND REDO DECOMPRESSION L4 - L5 AND REMOVAL OF FACET CYST L4 - L5 2 LEVELS;  Surgeon: Jene Every, MD;  Location: [facility] ORS;  Service: Orthopedics;  Laterality: Bilateral;   PERIPHERAL VASCULAR ATHERECTOMY Right 06/12/2021   Procedure: PERIPHERAL VASCULAR ATHERECTOMY;  Surgeon: Maeola Harman, MD;  Location: [facility] INVASIVE CV LAB;  Service: Cardiovascular;  Laterality: Right;  SFA/POP/AT   PERIPHERAL VASCULAR INTERVENTION Right 04/29/2019   Procedure: PERIPHERAL VASCULAR INTERVENTION;  Surgeon: Cephus Shelling, MD;  Location: [facility] INVASIVE CV LAB;  Service: Cardiovascular;  Laterality: Right;   PERIPHERAL VASCULAR INTERVENTION Right 06/12/2021   Procedure: PERIPHERAL VASCULAR INTERVENTION;  Surgeon: Maeola Harman, MD;  Location: [facility] INVASIVE CV LAB;  Service: Cardiovascular;  Laterality: Right;  SFP/POPLITEAL/AT   PERIPHERAL VASCULAR INTERVENTION  03/07/2022    Procedure: PERIPHERAL VASCULAR INTERVENTION;  Surgeon: Victorino Sparrow, MD;  Location: [facility] INVASIVE CV LAB;  Service: Cardiovascular;;   PERIPHERAL VASCULAR INTERVENTION Right 07/18/2022   Procedure: PERIPHERAL VASCULAR INTERVENTION;  Surgeon: Victorino Sparrow, MD;  Location: [facility] INVASIVE CV LAB;  Service: Cardiovascular;  Laterality: Right;   PORT-A-CATH REMOVAL  04/17/2011   Procedure: REMOVAL PORT-A-CATH;  Surgeon: Emelia Loron, MD;  Location: [facility];  Service: General;  Laterality: Left;   PORTACATH PLACEMENT  02/07/2011   Procedure: INSERTION PORT-A-CATH;  Surgeon: Emelia Loron, MD;  Location: [facility] ORS;  Service: General;  Laterality: N/A;   RIGHT/LEFT HEART CATH AND CORONARY ANGIOGRAPHY N/A 03/17/2020   Procedure: RIGHT/LEFT HEART CATH AND CORONARY ANGIOGRAPHY;  Surgeon: Kathleene Hazel, MD;  Location: [facility] INVASIVE CV LAB;  Service: Cardiovascular;  Laterality: N/A;   TEE WITHOUT CARDIOVERSION N/A 05/04/2020   Procedure: TRANSESOPHAGEAL ECHOCARDIOGRAM (TEE);  Surgeon: Purcell Nails, MD;  Location: [facility] OR;  Service: Open Heart Surgery;  Laterality: N/A;   UPPER GASTROINTESTINAL ENDOSCOPY      Allergies  Allergen Reactions   Codeine Nausea And Vomiting and Other (See Comments)    Severe stomach cramps, also Other reaction(s): Unknown   Antihistamines, Diphenhydramine-Type Other (See Comments)    Causes hyperactivity   Diphenhydramine     Other reaction(s): Unknown   Erythromycin Hives, Rash and Other (See Comments)    Allergic due to dental work from 50 years ago. It was used in packing and resulted in rash/hives inside and outside of mouth.   Folic Acid Other (See Comments)    Other reaction(s): tongue pain   Gabapentin     Urinary incontinence   Lyrica [Pregabalin]     Double vision/falls   Methotrexate Other (See Comments)    Other reaction(s): oral ulcers   Oxycodone     Hallucinations/crazy feelings   Rheumate [Fish Oil]     Other  reaction(s): hypoglycemia   Statins Other (See Comments)    Joint pains Other reaction(s): Unknown   Sulfa Antibiotics Other (See Comments)    Reaction not recalled   Trulicity [Dulaglutide] Itching    Current Outpatient Medications  Medication Sig Dispense Refill   Accu-Chek FastClix Lancets MISC USE TO CHECK BLOOD SUGARS 3 TIMES DAILY 306 each 3   amLODipine (NORVASC) 10 MG tablet Take 1 tablet (10 mg total) by mouth daily. 90 tablet 2   aspirin EC 81 MG tablet Take 81 mg by mouth in the morning.     blood glucose meter kit and supplies Dispense based on patient and insurance preference. Use up  to four times daily as directed. (FOR ICD-10 E10.9, E11.9). 1 each 0   Cholecalciferol 125 MCG (5000 UT) capsule Take 5,000 Units by mouth daily.     clobetasol ointment (TEMOVATE) 0.05 % APPLY A THIN LAYER TO THE AFFECTED AREA(S) BY TOPICAL ROUTE 2 TIMES PER DAY     clopidogrel (PLAVIX) 75 MG tablet TAKE 1 TABLET(75 MG) BY MOUTH DAILY 30 tablet 11   gatifloxacin (ZYMAXID) 0.5 % SOLN SMARTSIG:In Eye(s)     glipiZIDE (GLUCOTROL XL) 10 MG 24 hr tablet TAKE 1 TABLET BY MOUTH DAILY  WITH BREAKFAST 90 tablet 3   glucose blood (ACCU-CHEK GUIDE) test strip TEST ONCE DAILY 100 each 5   Golimumab (SIMPONI ARIA IV) Inject into the vein every 2 (two) months.     insulin glargine (LANTUS SOLOSTAR) 100 UNIT/ML Solostar Pen Inject 25 Units into the skin 2 (two) times daily. 15 mL 3   Insulin Pen Needle (CAREFINE PEN NEEDLES) 32G X 6 MM MISC 1 application by Does not apply route 2 (two) times daily. 120 each 1   ketorolac (ACULAR) 0.5 % ophthalmic solution SMARTSIG:In Eye(s)     levothyroxine (SYNTHROID) 125 MCG tablet TAKE 1 TABLET BY MOUTH DAILY 90 tablet 3   metoprolol tartrate (LOPRESSOR) 50 MG tablet TAKE 1 TABLET BY MOUTH TWICE  DAILY 180 tablet 1   nitrofurantoin (MACRODANTIN) 50 MG capsule Take 1 capsule (50 mg total) by mouth daily. 90 capsule 1   nitrofurantoin, macrocrystal-monohydrate, (MACROBID)  100 MG capsule Take 1 capsule (100 mg total) by mouth 2 (two) times daily. 14 capsule 0   OZEMPIC, 0.25 OR 0.5 MG/DOSE, 2 MG/3ML SOPN INJECT SUBCUTANEOUSLY 0.5 MG  WEEKLY 9 mL 3   pantoprazole (PROTONIX) 40 MG tablet TAKE 1 TABLET BY MOUTH DAILY 90 tablet 2   PARoxetine (PAXIL) 20 MG tablet TAKE 1 TABLET BY MOUTH IN THE  MORNING 90 tablet 3   prednisoLONE acetate (PRED FORTE) 1 % ophthalmic suspension SMARTSIG:In Eye(s)     REPATHA SURECLICK 140 MG/ML SOAJ INJECT 1 PEN INTO THE SKIN EVERY 14 DAYS 2 mL 11   Semaglutide,0.25 or 0.5MG /DOS, 2 MG/1.5ML SOPN Inject 0.5 mg into the skin every Tuesday. 3 mL 6   valACYclovir (VALTREX) 1000 MG tablet Take 1 tablet (1,000 mg total) by mouth daily as needed (Fever Blisters). 180 tablet 0   No current facility-administered medications for this visit.    Family History  Problem Relation Age of Onset   Kidney disease Mother    Heart disease Mother    Throat cancer Father    Alcoholism Father    Heart attack Father    Lymphoma Brother 34   Heart attack Brother    Diabetes Brother    Hypertension Brother    Cancer Son    Colon cancer Neg Hx    Stroke Neg Hx    Esophageal cancer Neg Hx    Rectal cancer Neg Hx    Stomach cancer Neg Hx     Social History   Socioeconomic History   Marital status: Married    Spouse name: Not on file   Number of children: 3   Years of education: Not on file   Highest education level: Not on file  Occupational History   Occupation: retired-bookkeeping  Tobacco Use   Smoking status: Former    Current packs/day: 0.00    Average packs/day: 2.0 packs/day for 40.0 years (80.0 ttl pk-yrs)    Types: Cigarettes    Start date: 12/19/1957  Quit date: 12/19/1997    Years since quitting: 25.1   Smokeless tobacco: Never  Vaping Use   Vaping status: Never Used  Substance and Sexual Activity   Alcohol use: Yes    Alcohol/week: 1.0 standard drink of alcohol    Types: 1 Glasses of wine per week    Comment: occasiona  - maybe once a month   Drug use: No   Sexual activity: Not Currently    Comment: menarch age 75, P43, HRT X 10 YRS, MENOPAUSE MID 40'S  Other Topics Concern   Not on file  Social History Narrative   One daughter died from suicide at age 85.   Social Drivers of Corporate investment banker Strain: Low Risk  (09/07/2022)   Overall Financial Resource Strain (CARDIA)    Difficulty of Paying Living Expenses: Not hard at all  Food Insecurity: No Food Insecurity (09/07/2022)   Hunger Vital Sign    Worried About Running Out of Food in the Last Year: Never true    Ran Out of Food in the Last Year: Never true  Transportation Needs: No Transportation Needs (09/07/2022)   PRAPARE - Administrator, Civil Service (Medical): No    Lack of Transportation (Non-Medical): No  Physical Activity: Inactive (09/07/2022)   Exercise Vital Sign    Days of Exercise per Week: 0 days    Minutes of Exercise per Session: 0 min  Stress: No Stress Concern Present (09/07/2022)   [employer] of Occupational Health - Occupational Stress Questionnaire    Feeling of Stress : Only a little  Social Connections: Moderately Isolated (09/07/2022)   Social Connection and Isolation Panel [NHANES]    Frequency of Communication with Friends and Family: More than three times a week    Frequency of Social Gatherings with Friends and Family: Three times a week    Attends Religious Services: Never    Active Member of Clubs or Organizations: No    Attends Banker Meetings: Never    Marital Status: Married  Catering manager Violence: Not At Risk (09/07/2022)   Humiliation, Afraid, Rape, and Kick questionnaire    Fear of Current or Ex-Partner: No    Emotionally Abused: No    Physically Abused: No    Sexually Abused: No     REVIEW OF SYSTEMS:   [X]  denotes positive finding, [ ]  denotes negative finding Cardiac  Comments:  Chest pain or chest pressure:    Shortness of breath upon exertion:    Short  of breath when lying flat:    Irregular heart rhythm:        Vascular    Pain in calf, thigh, or hip brought on by ambulation:    Pain in feet at night that wakes you up from your sleep:     Blood clot in your veins:    Leg swelling:         Pulmonary    Oxygen at home:    Productive cough:     Wheezing:         Neurologic    Sudden weakness in arms or legs:     Sudden numbness in arms or legs:     Sudden onset of difficulty speaking or slurred speech:    Temporary loss of vision in one eye:     Problems with dizziness:         Gastrointestinal    Blood in stool:     Vomited blood:  Genitourinary    Burning when urinating:     Blood in urine:        Psychiatric    Major depression:         Hematologic    Bleeding problems:    Problems with blood clotting too easily:        Skin    Rashes or ulcers:        Constitutional    Fever or chills:      PHYSICAL EXAMINATION:  There were no vitals filed for this visit.  There is no height or weight on file to calculate BMI.   General:  WDWN in NAD; vital signs documented above Gait: Not observed HENT: WNL, normocephalic Pulmonary: normal non-labored breathing , without wheezing Cardiac: regular HR, without carotid bruits Abdomen: soft, NT; aortic pulse is not palpable Skin: without rashes Vascular Exam/Pulses: Bilateral radial pulses are palpable; right popliteal pulse is not palpable; right DP pulse is easily palpable Extremities: without ischemic changes, without Gangrene , without cellulitis; without open wounds; prosthesis in place left leg Musculoskeletal: no muscle wasting or atrophy  Neurologic: A&O X 3 Psychiatric:  The pt has Normal affect.   +-------+-----------+-----------+------------+------------+  ABI/TBIToday's ABIToday's TBIPrevious ABIPrevious TBI  +-------+-----------+-----------+------------+------------+  Right 0.76       0.52       0.77        0.56           +-------+-----------+-----------+------------+------------+  Left  AKA        AKA        AKA         AKA           +-------+-----------+-----------+------------+------------+       +-----------+--------+-----+--------+----------+----------+  RIGHT     PSV cm/sRatioStenosisWaveform  Comments    +-----------+--------+-----+--------+----------+----------+  CFA Mid    125                  biphasic              +-----------+--------+-----+--------+----------+----------+  DFA       123                  biphasic              +-----------+--------+-----+--------+----------+----------+  ATA Distal 73                   biphasic              +-----------+--------+-----+--------+----------+----------+  PTA Distal 40                   monophasic            +-----------+--------+-----+--------+----------+----------+  PERO Distal30                   monophasicretrograde  +-----------+--------+-----+--------+----------+----------+       Right Stent(s):  +---------------+--------+--------+--------+--------+  prox-mid SFA   PSV cm/sStenosisWaveformComments  +---------------+--------+--------+--------+--------+  Prox to Stent  111             biphasic          +---------------+--------+--------+--------+--------+  Proximal Stent 114             biphasic          +---------------+--------+--------+--------+--------+  Mid Stent      88              biphasic          +---------------+--------+--------+--------+--------+  Distal Stent   89  biphasic          +---------------+--------+--------+--------+--------+  Distal to Stent69              biphasic          +---------------+--------+--------+--------+--------+      +---------------+--------+--------+---------+--------+  Distal SFA/Pop PSV cm/sStenosisWaveform Comments  +---------------+--------+--------+---------+--------+  Prox to Stent   86              biphasic           +---------------+--------+--------+---------+--------+  Proximal Stent 83              biphasic           +---------------+--------+--------+---------+--------+  Mid Stent      37              triphasic          +---------------+--------+--------+---------+--------+  Distal Stent   37              biphasic           +---------------+--------+--------+---------+--------+  Distal to Stent81              biphasic           +---------------+--------+--------+---------+--------+     +---------------+--------+--------+--------+--------+  ATA           PSV cm/sStenosisWaveformComments  +---------------+--------+--------+--------+--------+  Prox to Stent  89              biphasic          +---------------+--------+--------+--------+--------+  Proximal Stent 91              biphasic          +---------------+--------+--------+--------+--------+  Mid Stent      86              biphasic          +---------------+--------+--------+--------+--------+  Distal Stent   92              biphasic          +---------------+--------+--------+--------+--------+  Distal to [id number]             biphasic          +---------------+--------+--------+--------+--------+      ASSESSMENT/PLAN:: 83 y.o. female here for follow up for PAD with hx of aortogram with balloon angioplasty of the right ATA, popliteal and SFA stents and drug eluting stent within previous ATA stent on 07/18/2022 by Dr. Karin Lieu.    -pt doing well today with easily palpable right DP pulse.  Her ABI remains the same.  No stenosis of dater within the previously placed right lower extremity stents. -encouraged her to continue to stay active. -continue asa/statin/Plavix -pt will f/u in 6 months with RLE arterial duplex and ABI. -she will call sooner if she develops any non healing wounds or rest pain.    Victorino Sparrow MD [facility] [phone]

## 2023-02-21 ENCOUNTER — Encounter: Payer: Self-pay | Admitting: Vascular Surgery

## 2023-02-21 ENCOUNTER — Ambulatory Visit (INDEPENDENT_AMBULATORY_CARE_PROVIDER_SITE_OTHER)
Admission: RE | Admit: 2023-02-21 | Discharge: 2023-02-21 | Disposition: A | Discharge status: Home / Self Care | Payer: Medicare Other | Source: Ambulatory Visit | Attending: Vascular Surgery | Admitting: Vascular Surgery

## 2023-02-21 ENCOUNTER — Ambulatory Visit: Payer: Medicare Other | Admitting: Vascular Surgery

## 2023-02-21 ENCOUNTER — Ambulatory Visit (HOSPITAL_COMMUNITY)
Admission: RE | Admit: 2023-02-21 | Discharge: 2023-02-21 | Disposition: A | Discharge status: Home / Self Care | Payer: Medicare Other | Source: Ambulatory Visit | Attending: Vascular Surgery | Admitting: Vascular Surgery

## 2023-02-21 VITALS — BP 169/90 | HR 84 | Temp 97.9°F | Resp 16

## 2023-02-21 DIAGNOSIS — Z95820 Peripheral vascular angioplasty status with implants and grafts: Secondary | ICD-10-CM

## 2023-02-21 DIAGNOSIS — I739 Peripheral vascular disease, unspecified: Secondary | ICD-10-CM

## 2023-02-21 DIAGNOSIS — Z9889 Other specified postprocedural states: Secondary | ICD-10-CM

## 2023-02-21 LAB — VAS US ABI WITH/WO TBI: Right ABI: 0.76

## 2023-02-22 ENCOUNTER — Ambulatory Visit: Payer: Medicare Other

## 2023-02-22 ENCOUNTER — Encounter (HOSPITAL_COMMUNITY): Payer: Medicare Other

## 2023-02-22 ENCOUNTER — Other Ambulatory Visit: Payer: Self-pay | Admitting: Cardiovascular Disease

## 2023-03-06 ENCOUNTER — Other Ambulatory Visit: Payer: Self-pay | Admitting: Family Medicine

## 2023-03-06 DIAGNOSIS — E1142 Type 2 diabetes mellitus with diabetic polyneuropathy: Secondary | ICD-10-CM

## 2023-03-07 ENCOUNTER — Other Ambulatory Visit: Payer: Self-pay

## 2023-03-07 DIAGNOSIS — I739 Peripheral vascular disease, unspecified: Secondary | ICD-10-CM

## 2023-03-08 ENCOUNTER — Other Ambulatory Visit: Payer: Self-pay | Admitting: Family Medicine

## 2023-03-08 DIAGNOSIS — F3342 Major depressive disorder, recurrent, in full remission: Secondary | ICD-10-CM

## 2023-03-20 ENCOUNTER — Other Ambulatory Visit: Payer: Self-pay | Admitting: Cardiovascular Disease

## 2023-03-25 NOTE — Progress Notes (Signed)
 Date:  04/05/2023   ID:  [name], [name] 06-Nov-1940, MRN [id number]  PCP:  Loyola Mast, MD   [facility] Providers Cardiologist:  Eden Emms  Patient Profile:    [name] is a 83 y.o. female with:  Coronary artery disease  Aortic stenosis S/p DES to RCA in 2007 Myoview 2017 low risk  S/p CABG + AVR 4/22 Hypertension  Diabetes mellitus  Chronic kidney disease  Hyperlipidemia  Breast CA  Peripheral arterial disease  S/p R SFA stent GERD Hypothyroidism  Carotid artery disease  S/p carotid stent in 2007 [place] 4/22: Bilat 1-39  Prior CV studies: Pre-CABG Dopplers 05/02/20 Bilateral ICA 1-39   RIGHT/LEFT HEART CATH AND CORONARY ANGIOGRAPHY 03/17/2020 Narrative  Ost RCA to Prox RCA lesion is 80% stenosed.  Mid RCA lesion is 30% stenosed.  RPDA lesion is 60% stenosed.  1st Mrg lesion is 50% stenosed.  Prox Cx to Mid Cx lesion is 80% stenosed.  Mid LAD lesion is 80% stenosed. 1. No significant disease in the left main artery 2. Severe mid LAD stenosis. The LAD bifurcated distally beyond this lesion and the diagonal branch reaches the apex. 3. Moderate caliber non-dominant Circumflex with severe mid stenosis. The vessel is relatively small beyond the stenosis. The small to moderate caliber obtuse marginal branch arises prior to the stenosis and has moderate ostial stenosis. 4. The RCA is a large dominant artery. There is severe, heavily calcified stenosis at the ostium of the RCA. The distal stented segment is patent. There is moderately severe stenosis in the moderate caliber PDA. 5. Severe aortic stenosis (mean gradient 26.9 mmHg, peak gradient 34 mmHg)  Echocardiogram 02/29/20 Moderate-severe aortic stenosis, V-max 3.5 m/s, mean gradient 33 mmHg, DI 0.26, moderate MR, EF 65-70, no RWMA, moderate LVH, normal RVSF  GATED SPECT MYO PERF W/EXERCISE STRESS 1D 11/22/2015 Narrative  Nuclear stress EF: 59%. Normal wall motion  There was no ST segment  deviation noted during stress.  This is a low risk study. No perfusion defects, no ischemia   HPI:   83 y.o. with CAD and now post CABG/AVR  03/17/20 Cardiac catheterization demonstrated severe disease in the LAD and RCA.  There was also severe disease in a very small LCx.  She was referred to Dr. Cornelius Moras for CABG+AVR. She was admitted 4/13-4/18 and underwent CABG (L-LAD, S-PDA) + bioprosthetic AVR. Using a 23 mm Edwards Inspiris Resilia bovine pericardial valve   Circumflex / OM  too small to graft      Post op TTE 06/15/20 with EF 60-65% moderate MR normal AVR with no AR and mean gradient 4 peak 6.7 mmHg DVI 0.56.  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/21  Had limb threatening RLE ischemia and rest pain with intervention by Dr Randie Heinz on 06/12/21 including stent to SFA/popliteal and anterior tibial The right ABI was 0.77 on 08/17/22 Last intervention repeat stenting of right ATA 07/18/22   She feels much better Has nice prosthetic leg on left and more ambulatory  No complaints Getting along well with crutch and prosthetic LLE leg     Past Medical History:  Diagnosis Date   Allergy    Anemia    childhood   Anxiety    Aortic stenosis    Arterial stenosis (HCC)    mesenteric   Arthritis  Breast cancer (HCC) 12/05/10   R breast, inv mammary, in situ,ER/PR +,HER2 -   Cancer (HCC)    Carcinoma of breast treated with adjuvant chemotherapy (HCC)    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HCC)    Claudication (HCC)    Coronary artery disease    stent 2007   Depression    Diabetes mellitus    Difficulty sleeping    Diverticulosis 12/10/03   Elevated cholesterol    Generalized weakness    GERD (gastroesophageal reflux disease)    Heart murmur    Hepatitis    as an infant   HSV-1 (herpes simplex virus 1) infection    Hyperlipidemia     Hypertension    Hypothyroidism    Osteoarthritis    PAD (peripheral artery disease) (HCC)    Personal history of chemotherapy    Personal history of radiation therapy    Pneumonia    2014 september and March 2022   Rheumatoid arthritis (HCC)    S/P aortic valve replacement with bioprosthetic valve 05/04/2020   Edwards Inspiris Resilia stented bovine pericardial tissue valve, size 23 mm   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   LIMA to D3, SVG to PDA, EVH via right thigh   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   blood glucose meter kit and supplies Dispense based on patient and insurance preference. Use up to four times daily as directed. (FOR ICD-10 E10.9, E11.9).   Cholecalciferol 125 MCG (5000 UT) capsule Take 5,000 Units by mouth daily.   clobetasol ointment (TEMOVATE) 0.05 % APPLY A THIN LAYER TO THE AFFECTED AREA(S) BY TOPICAL ROUTE 2 TIMES PER DAY   clopidogrel (PLAVIX) 75 MG tablet TAKE 1 TABLET(75 MG) BY MOUTH DAILY   gatifloxacin (ZYMAXID) 0.5 % SOLN SMARTSIG:In Eye(s)   glipiZIDE (GLUCOTROL XL) 10 MG 24 hr tablet TAKE 1 TABLET BY MOUTH DAILY  WITH BREAKFAST   glucose blood (ACCU-CHEK GUIDE) test strip TEST ONCE DAILY   Golimumab (SIMPONI ARIA IV) Inject into the vein every 2 (two) months.   Insulin Pen Needle (CAREFINE PEN NEEDLES) 32G X 6 MM MISC 1 application by Does not apply route 2 (two) times daily.   LANTUS SOLOSTAR 100 UNIT/ML Solostar Pen ADMINISTER 25 UNITS UNDER THE SKIN TWICE DAILY   levothyroxine (SYNTHROID) 125 MCG tablet TAKE 1 TABLET BY MOUTH DAILY   metoprolol tartrate (LOPRESSOR) 50 MG tablet TAKE 1 TABLET BY MOUTH TWICE  DAILY   nitrofurantoin (MACRODANTIN) 50  MG capsule Take 1 capsule (50 mg total) by mouth daily.   OZEMPIC, 0.25 OR 0.5 MG/DOSE, 2 MG/3ML SOPN INJECT SUBCUTANEOUSLY 0.5 MG  WEEKLY   pantoprazole (PROTONIX) 40 MG tablet TAKE 1 TABLET BY MOUTH DAILY   PARoxetine (PAXIL) 20 MG tablet TAKE 1 TABLET BY MOUTH IN THE  MORNING   REPATHA SURECLICK 140 MG/ML SOAJ INJECT 1 PEN INTO THE SKIN EVERY 14 DAYS   Semaglutide,0.25 or 0.5MG /DOS, 2 MG/1.5ML SOPN Inject 0.5 mg into the skin every Tuesday.   valACYclovir (VALTREX) 1000 MG tablet Take 1 tablet (1,000 mg total) by mouth daily as  needed (Fever Blisters).   [DISCONTINUED] ketorolac (ACULAR) 0.5 % ophthalmic solution SMARTSIG:In Eye(s)   [DISCONTINUED] nitrofurantoin, macrocrystal-monohydrate, (MACROBID) 100 MG capsule Take 1 capsule (100 mg total) by mouth 2 (two) times daily.   [DISCONTINUED] prednisoLONE acetate (PRED FORTE) 1 % ophthalmic suspension SMARTSIG:In Eye(s)     Allergies:   Codeine; Antihistamines, diphenhydramine-type; Diphenhydramine; Erythromycin; Folic acid; Gabapentin; Lyrica [pregabalin]; Methotrexate; Oxycodone; Rheumate [fish oil]; Statins; Sulfa antibiotics; and Trulicity [dulaglutide]   Social History   Tobacco Use   Smoking status: Former    Current packs/day: 0.00    Average packs/day: 2.0 packs/day for 40.0 years (80.0 ttl pk-yrs)    Types: Cigarettes    Start date: 12/19/1957    Quit date: 12/19/1997    Years since quitting: 25.3   Smokeless tobacco: Never  Vaping Use   Vaping status: Never Used  Substance Use Topics   Alcohol use: Yes    Alcohol/week: 1.0 standard drink of alcohol    Types: 1 Glasses of wine per week    Comment: occasiona - maybe once a month   Drug use: No     Family Hx: The patient's family history includes Alcoholism in her father; Cancer in her son; Diabetes in her brother; Heart attack in her brother and father; Heart disease in her mother; Hypertension in her brother; Kidney disease in her mother; Lymphoma (age of onset: 7) in  her brother; Throat cancer in her father. There is no history of Colon cancer, Stroke, Esophageal cancer, Rectal cancer, or Stomach cancer.  Review of Systems  Constitutional: Negative for decreased appetite and fever.  Respiratory:  Negative for cough.      EKGs/Labs/Other Test Reviewed:    EKG:   SR LVH with strain ? Old IMI 05/30/20   Recent Labs: 01/08/2023: BUN 27; Creatinine, Ser 1.42; Hemoglobin 14.4; Platelets 322.0; Potassium 4.5; Sodium 136; TSH 0.45   Recent Lipid Panel Lab Results  Component Value Date/Time   CHOL 108 01/08/2023 08:54 AM   CHOL 125 02/09/2021 08:16 AM   TRIG 260.0 (H) 01/08/2023 08:54 AM   HDL 41.00 01/08/2023 08:54 AM   HDL 43 02/09/2021 08:16 AM   CHOLHDL 3 01/08/2023 08:54 AM   LDLCALC 15 01/08/2023 08:54 AM   LDLCALC 45 02/09/2021 08:16 AM   LDLDIRECT 33.0 01/01/2022 09:00 AM      Risk Assessment/Calculations:      Physical Exam:    VS:  BP (!) 146/86   Pulse 87   Ht 5\' 3"  (1.6 m)   Wt 150 lb 6.4 oz (68.2 kg)   SpO2 94%   BMI 26.64 kg/m     Wt Readings from Last 3 Encounters:  04/05/23 150 lb 6.4 oz (68.2 kg)  01/08/23 149 lb 6.4 oz (67.8 kg)  11/05/22 150 lb (68 kg)     Chronically ill female Post sternotomy SEM through AVR no AR Abdomen benign Post left AKA Palpable right DP        ASSESSMENT & PLAN:    CAD/CABG:  05/04/20 LIMA to Diagonal and SVG to PdA continue ASA And beta blocker AVR:  bicuspid pathology post 23 mm Edwards Inspiris Resilia stented bovine pericardial tissue valve TTE 03/01/21 normal function EF 55-60% no PVL peak gradient only 12 mmHg  HLD:  Intolerant to statins back on Repatha LDL 45 improved  DM:  Discussed low carb diet.  Target hemoglobin A1c is 6.5 or less.  Continue current medications. HTN:  elevated start Norvasc 10 mg  Thyroid : on  synthroid replacement check labs  PVD:  post left  AKA 02/2021 and complex limb salvage intervention by Dr Randie Heinz to RLE 06/12/21 and repeat stent 07/18/22 to ATA   with improvement ABI 0.76 02/21/23  MR:  TTE 03/01/21 only mild MR    F/U 6 months     Signed, Charlton Haws, MD  04/05/2023 9:01 AM    [facility] [address] [place], [place], [place]  [address] Phone: [phone]; Fax: [phone]

## 2023-04-05 ENCOUNTER — Encounter: Payer: Self-pay | Admitting: Cardiovascular Disease

## 2023-04-05 ENCOUNTER — Ambulatory Visit: Payer: Medicare Other | Attending: Cardiovascular Disease | Admitting: Cardiovascular Disease

## 2023-04-05 VITALS — BP 146/86 | HR 87 | Ht 63.0 in | Wt 150.4 lb

## 2023-04-05 DIAGNOSIS — Z953 Presence of xenogenic heart valve: Secondary | ICD-10-CM | POA: Diagnosis not present

## 2023-04-05 DIAGNOSIS — I10 Essential (primary) hypertension: Secondary | ICD-10-CM | POA: Diagnosis not present

## 2023-04-05 DIAGNOSIS — I739 Peripheral vascular disease, unspecified: Secondary | ICD-10-CM

## 2023-04-05 DIAGNOSIS — I2510 Atherosclerotic heart disease of native coronary artery without angina pectoris: Secondary | ICD-10-CM

## 2023-04-05 NOTE — Patient Instructions (Signed)
 Medication Instructions:  Your physician recommends that you continue on your current medications as directed. Please refer to the Current Medication list given to you today.  *If you need a refill on your cardiac medications before your next appointment, please call your pharmacy*  Lab Work: If you have labs (blood work) drawn today and your tests are completely normal, you will receive your results only by: MyChart Message (if you have MyChart) OR A paper copy in the mail If you have any lab test that is abnormal or we need to change your treatment, we will call you to review the results.  Testing/Procedures: None ordered today.  Follow-Up: At [facility], you and your health needs are our priority.  As part of our continuing mission to provide you with exceptional heart care, we have created designated Provider Care Teams.  These Care Teams include your primary Cardiologist (physician) and Advanced Practice Providers (APPs -  Physician Assistants and Nurse Practitioners) who all work together to provide you with the care you need, when you need it.  We recommend signing up for the patient portal called "MyChart".  Sign up information is provided on this After Visit Summary.  MyChart is used to connect with patients for Virtual Visits (Telemedicine).  Patients are able to view lab/test results, encounter notes, upcoming appointments, etc.  Non-urgent messages can be sent to your provider as well.   To learn more about what you can do with MyChart, go to [email or web address].    Your next appointment:   6 month(s)  Provider:   Charlton Haws, MD     Other Instructions    1st Floor: - Lobby - Registration  - Pharmacy  - Lab - Cafe  2nd Floor: - PV Lab - Diagnostic Testing (echo, CT, nuclear med)  3rd Floor: - Vacant  4th Floor: - TCTS (cardiothoracic surgery) - AFib Clinic - Structural Heart Clinic - Vascular Surgery  - Vascular Ultrasound  5th Floor: -  HeartCare Cardiology (general and EP) - Clinical Pharmacy for coumadin, hypertension, lipid, weight-loss medications, and med management appointments    Valet parking services will be available as well.

## 2023-04-08 ENCOUNTER — Encounter: Payer: Self-pay | Admitting: Family Medicine

## 2023-04-08 ENCOUNTER — Ambulatory Visit (INDEPENDENT_AMBULATORY_CARE_PROVIDER_SITE_OTHER): Payer: Medicare Other | Admitting: Family Medicine

## 2023-04-08 VITALS — BP 134/68 | HR 81 | Temp 97.4°F | Ht 63.0 in | Wt 150.4 lb

## 2023-04-08 DIAGNOSIS — E785 Hyperlipidemia, unspecified: Secondary | ICD-10-CM

## 2023-04-08 DIAGNOSIS — Z7985 Long-term (current) use of injectable non-insulin antidiabetic drugs: Secondary | ICD-10-CM

## 2023-04-08 DIAGNOSIS — I10 Essential (primary) hypertension: Secondary | ICD-10-CM | POA: Diagnosis not present

## 2023-04-08 DIAGNOSIS — I739 Peripheral vascular disease, unspecified: Secondary | ICD-10-CM

## 2023-04-08 DIAGNOSIS — Z794 Long term (current) use of insulin: Secondary | ICD-10-CM

## 2023-04-08 DIAGNOSIS — Z7984 Long term (current) use of oral hypoglycemic drugs: Secondary | ICD-10-CM

## 2023-04-08 DIAGNOSIS — E1142 Type 2 diabetes mellitus with diabetic polyneuropathy: Secondary | ICD-10-CM

## 2023-04-08 DIAGNOSIS — N1832 Chronic kidney disease, stage 3b: Secondary | ICD-10-CM | POA: Diagnosis not present

## 2023-04-08 DIAGNOSIS — E039 Hypothyroidism, unspecified: Secondary | ICD-10-CM | POA: Diagnosis not present

## 2023-04-08 LAB — GLUCOSE, RANDOM: Glucose, Bld: 67 mg/dL — ABNORMAL LOW (ref 70–99)

## 2023-04-08 LAB — HEMOGLOBIN A1C: Hgb A1c MFr Bld: 6.9 % — ABNORMAL HIGH (ref 4.6–6.5)

## 2023-04-08 NOTE — Assessment & Plan Note (Signed)
Stable. Continue focus on blood pressure and glucose control, adequate hydration, and avoidance of nephrotoxic medications.

## 2023-04-08 NOTE — Assessment & Plan Note (Signed)
 A1c has been int he mid-&% range. Continue glipizide 10 mg, semaglutide (Ozempic) 0.5 mg weekly, and insulin glargine 25 units bid. If this remains high, I would consider increasing her semaglutide to 1 mg weekly.

## 2023-04-08 NOTE — Progress Notes (Signed)
 [facility] [address] [place] [address] Dept: [phone] Dept Fax: [phone]  Chronic Care Office Visit  Subjective:    Patient ID: [name], female    DOB: 06-10-1940, 83 y.o..   MRN: [id number]  Chief Complaint  Patient presents with   Hypertension    3 month f/u HTN.  C/o having stomach upset/gas.  No OTC meds.    History of Present Illness:  Patient is in today for reassessment of chronic medical issues.  [name] has a history of peripheral artery disease. 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  She now has a prosthesis. She has severe PAD of the right leg, which is being followed by vascular surgery. She had a new stent placed in June 2024. She notes the vascular surgeon feels her arterial flow is good currently.   [name] has a past history of aortic stenosis and coronary artery disease. She had CABG x 2 and aortic valve replacement in 04/2020. She also has hypertension and hyperlipidemia.  She is managed on aspirin 81 mg daily, clopidogrel 75 mg daily, metoprolol tartrate 50 mg bid, and amlodipine 10 mg daily. She has been intolerant of statins and is on Repatha and Vascepa.   [name] has Type 2 diabetes. She is managed on glipizide 10 mg, semaglutide (Ozempic) 0.5 mg weekly, and insulin glargine (Lantus) 25 units bid. Her diabetes is complicated with peripheral neuropathy.   [name] has a history of hypothyroidism. She is managed on levothyroxine 125 mcg daily.    [name] has a history of rheumatoid arthritis and is on periodic infusions of Simponi. She also has erosive osteoarthritis of the hands. She feels she is managing with the discomfort by taking Tylenol.  [name] complains of some occasional episodes of gassiness and stomach upset. She notes had some mild constipation at times. She does not find this overly  concerning.   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   Primary insomnia 11/02/2020   Irritation of eyelid 07/26/2020   S/P aortic valve replacement with bioprosthetic valve 05/04/2020   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   Peripheral artery disease (HCC)    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) 04/07/2019   Scoliosis deformity of spine 02/03/2019   Osteoarthritis of hip 11/17/2018   Claudication (HCC)    Recurrent UTI 07/21/2018   Chronic headaches 11/12/2017   Degeneration of lumbar intervertebral disc 06/13/2017   Stage 3b chronic kidney disease (CKD) (HCC)    Mass of left adrenal gland (HCC) 04/19/2017   Spinal stenosis of lumbar region 10/20/2014   Hyperlipidemia 03/16/2013   Dense breasts 12/22/2012   Anxiety state 04/16/2012   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/16/2012   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   Osteoarthritis of knee 04/16/2012   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 female breast (HCC) 12/20/2010   History of breast cancer 12/05/2010   Depression 09/14/2008   GERD 09/14/2008   Type 2 diabetes mellitus with neurologic complication, with long-term current use of insulin (  HCC) 05/20/2008   Essential hypertension 04/17/2007   Coronary atherosclerosis 04/17/2007   Hypothyroidism 04/03/2007   Past Surgical History:  Procedure Laterality Date   ABDOMINAL AORTOGRAM W/LOWER EXTREMITY Bilateral 04/29/2019   Procedure: ABDOMINAL AORTOGRAM W/LOWER EXTREMITY;  Surgeon: Cephus Shelling, MD;  Location: [facility];  Service: Cardiovascular;  Laterality:  Bilateral;   ABDOMINAL AORTOGRAM W/LOWER EXTREMITY Left 07/01/2019   Procedure: ABDOMINAL AORTOGRAM W/ Left LOWER EXTREMITY [place];  Surgeon: Cephus Shelling, MD;  Location: [facility];  Service: Cardiovascular;  Laterality: Left;   ABDOMINAL AORTOGRAM W/LOWER EXTREMITY Left 03/03/2021   Procedure: ABDOMINAL AORTOGRAM W/LOWER EXTREMITY;  Surgeon: Leonie Douglas, MD;  Location: [facility];  Service: Cardiovascular;  Laterality: Left;   ABDOMINAL AORTOGRAM W/LOWER EXTREMITY Right 06/12/2021   Procedure: ABDOMINAL AORTOGRAM W/LOWER EXTREMITY;  Surgeon: Maeola Harman, MD;  Location: [facility] INVASIVE CV LAB;  Service: Cardiovascular;  Laterality: Right;  LOWER LEG   ABDOMINAL AORTOGRAM W/LOWER EXTREMITY N/A 03/07/2022   Procedure: ABDOMINAL AORTOGRAM W/LOWER EXTREMITY;  Surgeon: Victorino Sparrow, MD;  Location: [facility] INVASIVE CV LAB;  Service: Cardiovascular;  Laterality: N/A;   ABDOMINAL AORTOGRAM W/LOWER EXTREMITY N/A 07/18/2022   Procedure: ABDOMINAL AORTOGRAM W/LOWER EXTREMITY;  Surgeon: Victorino Sparrow, MD;  Location: [facility] INVASIVE CV LAB;  Service: Cardiovascular;  Laterality: N/A;   AMPUTATION Left 03/06/2021   Procedure: LEFT ABOVE KNEE AMPUTATION;  Surgeon: Victorino Sparrow, MD;  Location: [facility] OR;  Service: Vascular;  Laterality: Left;   ANTERIOR AND POSTERIOR REPAIR N/A 04/05/2016   Procedure: ANTERIOR (CYSTOCELE) AND POSTERIOR REPAIR (RECTOCELE);  Surgeon: Zelphia Cairo, MD;  Location: [facility] ORS;  Service: Gynecology;  Laterality: N/A;   AORTIC VALVE REPLACEMENT N/A 05/04/2020   Procedure: AORTIC VALVE REPLACEMENT (AVR) USING INSPIRIS RESILIA AORTIC VALVE;  Surgeon: Purcell Nails, MD;  Location: [facility] OR;  Service: Open Heart Surgery;  Laterality: N/A;   BREAST BIOPSY     BREAST LUMPECTOMY  1983   benign biopsy   BREAST LUMPECTOMY Right 12/29/2010   snbx, ER/PR +, Her2 -, 0/1 node pos.   CARPAL TUNNEL RELEASE Bilateral 9/12,8,12   CATARACT EXTRACTION Right  2024   CHOLECYSTECTOMY     COLONOSCOPY N/A 02/09/2013   Procedure: COLONOSCOPY;  Surgeon: Hart Carwin, MD;  Location: [facility];  Service: Endoscopy;  Laterality: N/A;   COLONOSCOPY     CORONARY ANGIOPLASTY     CORONARY ARTERY BYPASS GRAFT N/A 05/04/2020   Procedure: CORONARY ARTERY BYPASS GRAFTING (CABG), ON PUMP, TIMES TWO, USING LEFT INTERNAL MAMMARY ARTERY AND RIGHT ENDOSCOPICALLY HARVESTED GREATER SAPHENOUS VEIN;  Surgeon: Purcell Nails, MD;  Location: [facility] OR;  Service: Open Heart Surgery;  Laterality: N/A;   DILATION AND CURETTAGE OF UTERUS     ESOPHAGOGASTRODUODENOSCOPY N/A 02/09/2013   Procedure: ESOPHAGOGASTRODUODENOSCOPY (EGD);  Surgeon: Hart Carwin, MD;  Location: [facility];  Service: Endoscopy;  Laterality: N/A;   FOOT SPURS     HYSTEROSCOPY WITH D & C N/A 09/11/2012   Procedure: DILATATION AND CURETTAGE ;  Surgeon: Zelphia Cairo, MD;  Location: [facility];  Service: Gynecology;  Laterality: N/A;   KNEE SURGERY  2007   LAPAROSCOPIC ASSISTED VAGINAL HYSTERECTOMY N/A 04/05/2016   Procedure: LAPAROSCOPIC ASSISTED VAGINAL HYSTERECTOMY possible BSO;  Surgeon: Zelphia Cairo, MD;  Location: [facility];  Service: Gynecology;  Laterality: N/A;   LUMBAR LAMINECTOMY/DECOMPRESSION MICRODISCECTOMY N/A 10/20/2014   Procedure: MICRO LUMBER DECOMPRESSION L3-4 L4-5;  Surgeon: Jene Every, MD;  Location: [facility];  Service: Orthopedics;  Laterality: N/A;   LUMBAR LAMINECTOMY/DECOMPRESSION MICRODISCECTOMY Bilateral 10/13/2015   Procedure: MICRO LUMBAR DECOMPRESSION L5 - S1 AND REDO DECOMPRESSION L4 - L5 AND REMOVAL OF FACET CYST L4 - L5 2 LEVELS;  Surgeon: Jene Every, MD;  Location: [facility];  Service: Orthopedics;  Laterality: Bilateral;   PERIPHERAL VASCULAR ATHERECTOMY Right 06/12/2021   Procedure: PERIPHERAL VASCULAR ATHERECTOMY;  Surgeon: Maeola Harman, MD;  Location: [facility] INVASIVE CV LAB;  Service: Cardiovascular;  Laterality: Right;  SFA/POP/AT   PERIPHERAL VASCULAR  INTERVENTION Right 04/29/2019   Procedure: PERIPHERAL VASCULAR INTERVENTION;  Surgeon: Cephus Shelling, MD;  Location: [facility];  Service: Cardiovascular;  Laterality: Right;   PERIPHERAL VASCULAR INTERVENTION Right 06/12/2021   Procedure: PERIPHERAL VASCULAR INTERVENTION;  Surgeon: Maeola Harman, MD;  Location: [facility] INVASIVE CV LAB;  Service: Cardiovascular;  Laterality: Right;  SFP/POPLITEAL/AT   PERIPHERAL VASCULAR INTERVENTION  03/07/2022   Procedure: PERIPHERAL VASCULAR INTERVENTION;  Surgeon: Victorino Sparrow, MD;  Location: [facility] INVASIVE CV LAB;  Service: Cardiovascular;;   PERIPHERAL VASCULAR INTERVENTION Right 07/18/2022   Procedure: PERIPHERAL VASCULAR INTERVENTION;  Surgeon: Victorino Sparrow, MD;  Location: [facility] INVASIVE CV LAB;  Service: Cardiovascular;  Laterality: Right;   PORT-A-CATH REMOVAL  04/17/2011   Procedure: REMOVAL PORT-A-CATH;  Surgeon: Emelia Loron, MD;  Location: [facility];  Service: General;  Laterality: Left;   PORTACATH PLACEMENT  02/07/2011   Procedure: INSERTION PORT-A-CATH;  Surgeon: Emelia Loron, MD;  Location: [facility];  Service: General;  Laterality: N/A;   RIGHT/LEFT HEART CATH AND CORONARY ANGIOGRAPHY N/A 03/17/2020   Procedure: RIGHT/LEFT HEART CATH AND CORONARY ANGIOGRAPHY;  Surgeon: Kathleene Hazel, MD;  Location: [facility];  Service: Cardiovascular;  Laterality: N/A;   TEE WITHOUT CARDIOVERSION N/A 05/04/2020   Procedure: TRANSESOPHAGEAL ECHOCARDIOGRAM (TEE);  Surgeon: Purcell Nails, MD;  Location: [facility] OR;  Service: Open Heart Surgery;  Laterality: N/A;   UPPER GASTROINTESTINAL ENDOSCOPY     Family History  Problem Relation Age of Onset   Kidney disease Mother    Heart disease Mother    Throat cancer Father    Alcoholism Father    Heart attack Father    Lymphoma Brother 44   Heart attack Brother    Diabetes Brother    Hypertension Brother    Cancer Son    Colon cancer Neg Hx     Stroke Neg Hx    Esophageal cancer Neg Hx    Rectal cancer Neg Hx    Stomach cancer Neg Hx    Outpatient Medications Prior to Visit  Medication Sig Dispense Refill   Accu-Chek FastClix Lancets MISC USE TO CHECK BLOOD SUGARS 3 TIMES DAILY 306 each 3   amLODipine (NORVASC) 10 MG tablet TAKE 1 TABLET(10 MG) BY MOUTH DAILY 90 tablet 2   aspirin EC 81 MG tablet Take 81 mg by mouth in the morning.     blood glucose meter kit and supplies Dispense based on patient and insurance preference. Use up to four times daily as directed. (FOR ICD-10 E10.9, E11.9). 1 each 0   Cholecalciferol 125 MCG (5000 UT) capsule Take 5,000 Units by mouth daily.     clobetasol ointment (TEMOVATE) 0.05 % APPLY A THIN LAYER TO THE AFFECTED AREA(S) BY TOPICAL ROUTE 2 TIMES PER DAY     clopidogrel (PLAVIX) 75 MG tablet TAKE 1 TABLET(75 MG) BY MOUTH DAILY 30 tablet 11   gatifloxacin (ZYMAXID) 0.5 % SOLN SMARTSIG:In Eye(s)  glipiZIDE (GLUCOTROL XL) 10 MG 24 hr tablet TAKE 1 TABLET BY MOUTH DAILY  WITH BREAKFAST 90 tablet 3   glucose blood (ACCU-CHEK GUIDE) test strip TEST ONCE DAILY 100 each 5   Golimumab (SIMPONI ARIA IV) Inject into the vein every 2 (two) months.     Insulin Pen Needle (CAREFINE PEN NEEDLES) 32G X 6 MM MISC 1 application by Does not apply route 2 (two) times daily. 120 each 1   LANTUS SOLOSTAR 100 UNIT/ML Solostar Pen ADMINISTER 25 UNITS UNDER THE SKIN TWICE DAILY 15 mL 3   levothyroxine (SYNTHROID) 125 MCG tablet TAKE 1 TABLET BY MOUTH DAILY 90 tablet 3   metoprolol tartrate (LOPRESSOR) 50 MG tablet TAKE 1 TABLET BY MOUTH TWICE  DAILY 180 tablet 2   nitrofurantoin (MACRODANTIN) 50 MG capsule Take 1 capsule (50 mg total) by mouth daily. 90 capsule 1   OZEMPIC, 0.25 OR 0.5 MG/DOSE, 2 MG/3ML SOPN INJECT SUBCUTANEOUSLY 0.5 MG  WEEKLY 9 mL 3   pantoprazole (PROTONIX) 40 MG tablet TAKE 1 TABLET BY MOUTH DAILY 90 tablet 2   PARoxetine (PAXIL) 20 MG tablet TAKE 1 TABLET BY MOUTH IN THE  MORNING 90 tablet 3    REPATHA SURECLICK 140 MG/ML SOAJ INJECT 1 PEN INTO THE SKIN EVERY 14 DAYS 2 mL 11   Semaglutide,0.25 or 0.5MG /DOS, 2 MG/1.5ML SOPN Inject 0.5 mg into the skin every Tuesday. 3 mL 6   valACYclovir (VALTREX) 1000 MG tablet Take 1 tablet (1,000 mg total) by mouth daily as needed (Fever Blisters). 180 tablet 0   No facility-administered medications prior to visit.   Allergies  Allergen Reactions   Codeine Nausea And Vomiting and Other (See Comments)    Severe stomach cramps, also Other reaction(s): Unknown   Antihistamines, Diphenhydramine-Type Other (See Comments)    Causes hyperactivity   Diphenhydramine     Other reaction(s): Unknown   Erythromycin Hives, Rash and Other (See Comments)    Allergic due to dental work from 50 years ago. It was used in packing and resulted in rash/hives inside and outside of mouth.   Folic Acid Other (See Comments)    Other reaction(s): tongue pain   Gabapentin     Urinary incontinence   Lyrica [Pregabalin]     Double vision/falls   Methotrexate Other (See Comments)    Other reaction(s): oral ulcers   Oxycodone     Hallucinations/crazy feelings   Rheumate [Fish Oil]     Other reaction(s): hypoglycemia   Statins Other (See Comments)    Joint pains Other reaction(s): Unknown   Sulfa Antibiotics Other (See Comments)    Reaction not recalled   Trulicity [Dulaglutide] Itching   Objective:   Today's Vitals   04/08/23 0834  BP: 134/68  Pulse: 81  Temp: (!) 97.4 F (36.3 C)  TempSrc: Temporal  SpO2: 97%  Weight: 150 lb 6.4 oz (68.2 kg)  Height: 5\' 3"  (1.6 m)   Body mass index is 26.64 kg/m.   General: Well developed, well nourished. No acute distress. Right Foot- Skin intact. No sign of maceration between toes. Nails are normal. Dorsalis pedis and posterior tibial artery   pulses are normal. 5.07 monofilament testing normal, except for great toe.Marland Kitchen Psych: Alert and oriented. Normal mood and affect.  Health Maintenance Due  Topic Date Due    DTaP/Tdap/Td (1 - Tdap) Never done   Zoster Vaccines- Shingrix (2 of 2) 03/29/2011     Assessment & Plan:   Problem List Items Addressed This Visit  Cardiovascular and Mediastinum   Essential hypertension - Primary   Blood pressure is acceptable. Continue amlodipine 10 mg daily and metoprolol tartrate 50 mg bid. I would consider addition of an SGLT2i, however, she has had an issue with recurrent UTIs, which would be a relative contraindication. I would also consider an ACE-I or ARB.      Peripheral artery disease [facility])   [name] has severe PAD. We continue to focus on optimal diabetes, hypertension, and hyperlipidemia control.      Relevant Orders   Glucose, random   Hemoglobin A1c     Endocrine   Hypothyroidism (Chronic)   TSH at goal. Continue levothyroxine 125 mcg daily.      Diabetic peripheral neuropathy (HCC)   Decreased feeling in her right great toe. Reviewed recommended prevention efforts.      Type 2 diabetes mellitus with neurologic complication, with long-term current use of insulin (HCC)   A1c has been int he mid-&% range. Continue glipizide 10 mg, semaglutide (Ozempic) 0.5 mg weekly, and insulin glargine 25 units bid. If this remains high, I would consider increasing her semaglutide to 1 mg weekly.        Genitourinary   Stage 3b chronic kidney disease (CKD) (HCC)   Stable. Continue focus on blood pressure and glucose control, adequate hydration, and avoidance of nephrotoxic medications.        Other   Hyperlipidemia   LDL cholesterol at goal. Continue Repatha and Vascepa 1 gm, 2 caps bid.        Return in about 3 months (around 07/09/2023) for Reassessment.   Loyola Mast, MD

## 2023-04-08 NOTE — Assessment & Plan Note (Signed)
 LDL cholesterol at goal. Continue Repatha and Vascepa 1 gm, 2 caps bid.

## 2023-04-08 NOTE — Assessment & Plan Note (Signed)
 Decreased feeling in her right great toe. Reviewed recommended prevention efforts.

## 2023-04-08 NOTE — Assessment & Plan Note (Signed)
Ms. Rueb has severe PAD. We continue to focus on optimal diabetes, hypertension, and hyperlipidemia control.

## 2023-04-08 NOTE — Assessment & Plan Note (Signed)
 Blood pressure is acceptable. Continue amlodipine 10 mg daily and metoprolol tartrate 50 mg bid. I would consider addition of an SGLT2i, however, she has had an issue with recurrent UTIs, which would be a relative contraindication. I would also consider an ACE-I or ARB.

## 2023-04-08 NOTE — Assessment & Plan Note (Signed)
 TSH at goal. Continue levothyroxine 125 mcg daily.

## 2023-05-30 ENCOUNTER — Telehealth: Payer: Self-pay

## 2023-05-31 NOTE — Telephone Encounter (Signed)
 Daughter called with concerns of baseball knot on right lower leg.  Wanted mother seen.  Appt made for studies on 06/03/2022 and appt with PA on 06/04/2022  . Patient is asked to ask to see triage nurse on Monday 06/03/2022 if needed.

## 2023-06-03 ENCOUNTER — Ambulatory Visit (HOSPITAL_COMMUNITY)
Admission: RE | Admit: 2023-06-03 | Discharge: 2023-06-03 | Disposition: A | Discharge status: Home / Self Care | Source: Ambulatory Visit | Attending: Surgery | Admitting: Surgery

## 2023-06-03 ENCOUNTER — Ambulatory Visit (HOSPITAL_BASED_OUTPATIENT_CLINIC_OR_DEPARTMENT_OTHER)
Admission: RE | Admit: 2023-06-03 | Discharge: 2023-06-03 | Disposition: A | Discharge status: Home / Self Care | Source: Ambulatory Visit | Attending: Surgery | Admitting: Surgery

## 2023-06-03 ENCOUNTER — Other Ambulatory Visit: Payer: Self-pay

## 2023-06-03 DIAGNOSIS — I739 Peripheral vascular disease, unspecified: Secondary | ICD-10-CM | POA: Insufficient documentation

## 2023-06-03 LAB — VAS US ABI WITH/WO TBI: Right ABI: 0.67

## 2023-06-04 ENCOUNTER — Ambulatory Visit: Attending: Vascular Surgery | Admitting: Physician Assistant

## 2023-06-04 VITALS — BP 155/80 | HR 77 | Temp 98.6°F | Wt 150.0 lb

## 2023-06-04 DIAGNOSIS — I739 Peripheral vascular disease, unspecified: Secondary | ICD-10-CM

## 2023-06-04 NOTE — Progress Notes (Signed)
 Office Note   History of Present Illness   [name] is a 83 y.o. (02-04-1940) female who presents for surveillance of PAD.  She has a history of the following vascular procedures:  1) right SFA angioplasty and stenting on 04/29/2019 by Dr. Fulton Job 2) left AKA on 03/06/2021 by Dr. Rosalva Comber 3) right SFA/popliteal laser atherectomy, right SFA angioplasty and stenting, right AT stenting on 03/07/2022 by Dr. Rosalva Comber 4) drug-coated balloon angioplasty of previous right lower extremity stents, balloon angioplasty of the right AT, and drug-eluting stent within the previous AT stent on 07/18/2022 by Dr.Robins  She returns today for follow-up.  Overall she is doing alright.  She said a few weeks ago she developed a "small, soft" circular area of puffiness on her right lateral lower leg that concerns her.  As of today, this area has now moved below the lateral malleolus.  She also says an area around her right medial lower leg has felt "sore" occasionally.  She denies any rest pain, claudication, or tissue loss.  Current Outpatient Medications  Medication Sig Dispense Refill   Accu-Chek FastClix Lancets MISC USE TO CHECK BLOOD SUGARS 3 TIMES DAILY 306 each 3   amLODipine  (NORVASC ) 10 MG tablet TAKE 1 TABLET(10 MG) BY MOUTH DAILY 90 tablet 2   aspirin  EC 81 MG tablet Take 81 mg by mouth in the morning.     blood glucose meter kit and supplies Dispense based on patient and insurance preference. Use up to four times daily as directed. (FOR ICD-10 E10.9, E11.9). 1 each 0   Cholecalciferol 125 MCG (5000 UT) capsule Take 5,000 Units by mouth daily.     clobetasol  ointment (TEMOVATE ) 0.05 % APPLY A THIN LAYER TO THE AFFECTED AREA(S) BY TOPICAL ROUTE 2 TIMES PER DAY     clopidogrel  (PLAVIX ) 75 MG tablet TAKE 1 TABLET(75 MG) BY MOUTH DAILY 30 tablet 11   gatifloxacin (ZYMAXID) 0.5 % SOLN SMARTSIG:In Eye(s)     glipiZIDE  (GLUCOTROL  XL) 10 MG 24 hr tablet TAKE 1 TABLET BY MOUTH DAILY  WITH BREAKFAST 90 tablet 3    glucose blood (ACCU-CHEK GUIDE) test strip TEST ONCE DAILY 100 each 5   Golimumab (SIMPONI ARIA IV) Inject into the vein every 2 (two) months.     Insulin  Pen Needle (CAREFINE PEN NEEDLES) 32G X 6 MM MISC 1 application by Does not apply route 2 (two) times daily. 120 each 1   LANTUS  SOLOSTAR 100 UNIT/ML Solostar Pen ADMINISTER 25 UNITS UNDER THE SKIN TWICE DAILY 15 mL 3   levothyroxine  (SYNTHROID ) 125 MCG tablet TAKE 1 TABLET BY MOUTH DAILY 90 tablet 3   metoprolol  tartrate (LOPRESSOR ) 50 MG tablet TAKE 1 TABLET BY MOUTH TWICE  DAILY 180 tablet 2   nitrofurantoin  (MACRODANTIN ) 50 MG capsule Take 1 capsule (50 mg total) by mouth daily. 90 capsule 1   OZEMPIC , 0.25 OR 0.5 MG/DOSE, 2 MG/3ML SOPN INJECT SUBCUTANEOUSLY 0.5 MG  WEEKLY 9 mL 3   pantoprazole  (PROTONIX ) 40 MG tablet TAKE 1 TABLET BY MOUTH DAILY 90 tablet 2   PARoxetine  (PAXIL ) 20 MG tablet TAKE 1 TABLET BY MOUTH IN THE  MORNING 90 tablet 3   REPATHA  SURECLICK 140 MG/ML SOAJ INJECT 1 PEN INTO THE SKIN EVERY 14 DAYS 2 mL 11   Semaglutide ,0.25 or 0.5MG /DOS, 2 MG/1.5ML SOPN Inject 0.5 mg into the skin every Tuesday. 3 mL 6   valACYclovir  (VALTREX ) 1000 MG tablet Take 1 tablet (1,000 mg total) by mouth daily as needed (  Fever Blisters). 180 tablet 0   No current facility-administered medications for this visit.    REVIEW OF SYSTEMS (negative unless checked):   Cardiac:  []  Chest pain or chest pressure? []  Shortness of breath upon activity? []  Shortness of breath when lying flat? []  Irregular heart rhythm?  Vascular:  []  Pain in calf, thigh, or hip brought on by walking? []  Pain in feet at night that wakes you up from your sleep? []  Blood clot in your veins? []  Leg swelling?  Pulmonary:  []  Oxygen at home? []  Productive cough? []  Wheezing?  Neurologic:  []  Sudden weakness in arms or legs? []  Sudden numbness in arms or legs? []  Sudden onset of difficult speaking or slurred speech? []  Temporary loss of vision in one eye? []   Problems with dizziness?  Gastrointestinal:  []  Blood in stool? []  Vomited blood?  Genitourinary:  []  Burning when urinating? []  Blood in urine?  Psychiatric:  []  Major depression  Hematologic:  []  Bleeding problems? []  Problems with blood clotting?  Dermatologic:  []  Rashes or ulcers?  Constitutional:  []  Fever or chills?  Ear/Nose/Throat:  []  Change in hearing? []  Nose bleeds? []  Sore throat?  Musculoskeletal:  []  Back pain? []  Joint pain? []  Muscle pain?   Physical Examination    Vitals:   06/04/23 1119  BP: (!) 155/80  Pulse: 77  Temp: 98.6 F (37 C)  TempSrc: Temporal  SpO2: 93%  Weight: 150 lb (68 kg)   Body mass index is 26.57 kg/m.  General:  WDWN in NAD; vital signs documented above Gait: Not observed HENT: WNL, normocephalic Pulmonary: normal non-labored breathing  Cardiac: regular Abdomen: soft, NT, no masses Skin: without rashes Vascular Exam/Pulses: palpable right DP pulse Extremities: without ischemic changes, without gangrene , without cellulitis; without open wounds;  Musculoskeletal: no muscle wasting or atrophy  Neurologic: A&O X 3;  No focal weakness or paresthesias are detected Psychiatric:  The pt has Normal affect.  Non-Invasive Vascular imaging   ABI (06/03/2023) R:  ABI: 0.67 (0.76),  PT: mono DP: mono TBI:  not obtained L AKA  RLE Arterial Duplex (06/03/2023) Patent right proximal SFA, distal SFA, and AT stents without stenosis  Medical Decision Making   [name] is a 83 y.o. female who presents for surveillance of PAD  Based on the patient's vascular studies, her ABIs are slightly decreased from her last visit, from 0.76 to 0.67 Arterial duplex of the RLE demonstrates several patent stents without stenosis On exam she has an easily palpable right DP pulse. She has no tissue loss.  I cannot visualize or palpate anything concerning on her right medial lower leg.  On her right lateral lower leg, below the  malleolus, there appears to be a soft mass below the skin.  This potentially could be a lipoma.  This area is nontender to touch. She denies any claudication, rest pain, or tissue loss She can follow-up with our office in 6 months with repeat ABIs and RLE arterial duplex   Deneise Finlay PA-C [facility] Office: [phone]  Clinic MD: Marcelino Sera

## 2023-06-07 ENCOUNTER — Other Ambulatory Visit: Payer: Self-pay | Admitting: *Deleted

## 2023-06-07 DIAGNOSIS — T82856A Stenosis of peripheral vascular stent, initial encounter: Secondary | ICD-10-CM

## 2023-06-07 DIAGNOSIS — Z95820 Peripheral vascular angioplasty status with implants and grafts: Secondary | ICD-10-CM

## 2023-06-07 DIAGNOSIS — I739 Peripheral vascular disease, unspecified: Secondary | ICD-10-CM

## 2023-06-19 ENCOUNTER — Telehealth: Payer: Self-pay

## 2023-06-19 NOTE — Telephone Encounter (Signed)
 Attempted multiple times to return pt's call on different numbers she left. Left her a VM to call us  back if she still needs assistance.

## 2023-06-20 ENCOUNTER — Telehealth: Payer: Self-pay

## 2023-06-20 NOTE — Telephone Encounter (Signed)
 Pt called requesting orders to get an electric wheelchair. Tried returning pt's call x 2.  No answer.

## 2023-06-21 ENCOUNTER — Telehealth: Payer: Self-pay | Admitting: *Deleted

## 2023-06-21 NOTE — Telephone Encounter (Signed)
 Called and LVM regarding electric wheelchair prescription. Pt will need to follow up with PCP regarding this prescription .

## 2023-06-29 ENCOUNTER — Other Ambulatory Visit: Payer: Self-pay | Admitting: Family Medicine

## 2023-06-29 DIAGNOSIS — E1142 Type 2 diabetes mellitus with diabetic polyneuropathy: Secondary | ICD-10-CM

## 2023-06-29 DIAGNOSIS — Z794 Long term (current) use of insulin: Secondary | ICD-10-CM

## 2023-07-01 ENCOUNTER — Other Ambulatory Visit: Payer: Self-pay

## 2023-07-01 DIAGNOSIS — Z794 Long term (current) use of insulin: Secondary | ICD-10-CM

## 2023-07-01 MED ORDER — LANTUS SOLOSTAR 100 UNIT/ML ~~LOC~~ SOPN: 25.0000 [IU] | PEN_INJECTOR | Freq: Two times a day (BID) | SUBCUTANEOUS | 3 refills | Status: DC

## 2023-07-01 NOTE — Telephone Encounter (Signed)
 Spoke to pharmacy, she is due for a refill.  RX sent to the pharmacy.  Dm/cma

## 2023-07-09 ENCOUNTER — Ambulatory Visit (INDEPENDENT_AMBULATORY_CARE_PROVIDER_SITE_OTHER): Admitting: Family Medicine

## 2023-07-09 ENCOUNTER — Ambulatory Visit: Payer: Self-pay | Admitting: Family Medicine

## 2023-07-09 ENCOUNTER — Encounter: Payer: Self-pay | Admitting: Family Medicine

## 2023-07-09 VITALS — BP 124/70 | HR 75 | Temp 97.0°F | Ht 63.0 in | Wt 146.4 lb

## 2023-07-09 DIAGNOSIS — E1142 Type 2 diabetes mellitus with diabetic polyneuropathy: Secondary | ICD-10-CM | POA: Diagnosis not present

## 2023-07-09 DIAGNOSIS — S78112A Complete traumatic amputation at level between left hip and knee, initial encounter: Secondary | ICD-10-CM

## 2023-07-09 DIAGNOSIS — M0609 Rheumatoid arthritis without rheumatoid factor, multiple sites: Secondary | ICD-10-CM

## 2023-07-09 DIAGNOSIS — M25571 Pain in right ankle and joints of right foot: Secondary | ICD-10-CM | POA: Diagnosis not present

## 2023-07-09 DIAGNOSIS — M154 Erosive (osteo)arthritis: Secondary | ICD-10-CM

## 2023-07-09 DIAGNOSIS — Z7984 Long term (current) use of oral hypoglycemic drugs: Secondary | ICD-10-CM

## 2023-07-09 DIAGNOSIS — E039 Hypothyroidism, unspecified: Secondary | ICD-10-CM

## 2023-07-09 DIAGNOSIS — I10 Essential (primary) hypertension: Secondary | ICD-10-CM

## 2023-07-09 DIAGNOSIS — Z794 Long term (current) use of insulin: Secondary | ICD-10-CM | POA: Diagnosis not present

## 2023-07-09 DIAGNOSIS — E785 Hyperlipidemia, unspecified: Secondary | ICD-10-CM | POA: Diagnosis not present

## 2023-07-09 DIAGNOSIS — N1832 Chronic kidney disease, stage 3b: Secondary | ICD-10-CM

## 2023-07-09 DIAGNOSIS — Z7985 Long-term (current) use of injectable non-insulin antidiabetic drugs: Secondary | ICD-10-CM

## 2023-07-09 DIAGNOSIS — M069 Rheumatoid arthritis, unspecified: Secondary | ICD-10-CM | POA: Insufficient documentation

## 2023-07-09 LAB — HEMOGLOBIN A1C: Hgb A1c MFr Bld: 7.1 % — ABNORMAL HIGH (ref 4.6–6.5)

## 2023-07-09 LAB — GLUCOSE, RANDOM: Glucose, Bld: 134 mg/dL — ABNORMAL HIGH (ref 70–99)

## 2023-07-09 NOTE — Assessment & Plan Note (Signed)
Continue golimumab (Simponi) infusions. Following with rheumatology.

## 2023-07-09 NOTE — Assessment & Plan Note (Signed)
 LDL cholesterol at goal. Continue Repatha and Vascepa 1 gm, 2 caps bid.

## 2023-07-09 NOTE — Assessment & Plan Note (Signed)
 Last A1c has been at goal. Continue glipizide  10 mg, semaglutide  (Ozempic ) 0.5 mg weekly, and insulin  glargine 25 units bid.

## 2023-07-09 NOTE — Assessment & Plan Note (Signed)
Stable. Continue focus on blood pressure and glucose control, adequate hydration, and avoidance of nephrotoxic medications.

## 2023-07-09 NOTE — Assessment & Plan Note (Signed)
 Blood pressure is at goal. Continue amlodipine  10 mg daily and metoprolol  tartrate 50 mg bid. I would consider addition of an SGLT2i, however, she has had an issue with recurrent UTIs, which would be a relative contraindication. I would also consider an ACE-I or ARB if needed.

## 2023-07-09 NOTE — Assessment & Plan Note (Signed)
 Stable. Managing discomfort with Tylenol . Continue to follow with rheumatology. This does impact grip strength for propelling a standard wheelchair. I do recommend an electric chair to facilitate mobility.

## 2023-07-09 NOTE — Progress Notes (Signed)
 [facility] [address] [place] [address] Dept: [phone] Dept Fax: [phone]  Chronic Care Office Visit  Subjective:    Patient ID: [name], female    DOB: May 31, 1940, 83 y.o..   MRN: [id number]  Chief Complaint  Patient presents with   Hypertension    3 month f/u HTN.  C/o having RT ankle pain,  needs a wheel chair order for home and ? Regarding for Magnesium  oil?    History of Present Illness:  Patient is in today for reassessment of chronic medical issues.  Ms. [name] has a history of peripheral artery disease. 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  She now has a prosthesis. She has severe PAD of the right leg, which is being followed by vascular surgery. She had a new stent placed in June 2024. She notes the vascular surgeon feels her arterial flow is good currently.   Ms. [name] has a past history of aortic stenosis and coronary artery disease. She had CABG x 2 and aortic valve replacement in 04/2020. She also has hypertension and hyperlipidemia.  She is managed on aspirin  81 mg daily, clopidogrel  75 mg daily, metoprolol  tartrate 50 mg bid, and amlodipine  10 mg daily. She has been intolerant of statins and is on Repatha  and Vascepa .   Ms. [name] has Type 2 diabetes. She is managed on glipizide  10 mg, semaglutide  (Ozempic ) 0.5 mg weekly, and insulin  glargine (Lantus ) 25 units bid. Her diabetes is complicated with peripheral neuropathy.   Ms. [name] has a history of hypothyroidism. She is managed on levothyroxine  125 mcg daily.    Ms. [name] has a history of rheumatoid arthritis and is on periodic infusions of Simponi. She also has erosive osteoarthritis of the hands. She notes that recently she developed some swelling of the right ankle with some mild discomfort. She does not recall any injury to this joint. Ms. [name] uses a wheelchair at  home and a walker when out in public. She notes that she has difficulty navigating in her home with a standard wheelchair. She has arthritis issues with her hands that makes using a standard wheelchair difficult. She ends up using her right leg to help propel herself, but has significant PAD and arthritis that make this problematic. She notes she needs a lightweight chair to be able to maneuver in her home.   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   Sinus tachycardia 03/11/2021   Elevated troponin 03/08/2021   Stroke-like symptoms 03/08/2021   Anemia of chronic disease 03/08/2021   Reactive airway disease 01/20/2021   Diabetic peripheral neuropathy (HCC) 12/19/2020   Primary insomnia 11/02/2020   Irritation of eyelid 07/26/2020   S/P aortic valve replacement with bioprosthetic valve 05/04/2020   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   Peripheral artery disease (HCC)    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) 04/07/2019   Scoliosis deformity of spine 02/03/2019   Osteoarthritis of hip 11/17/2018   Claudication (HCC)    Recurrent UTI 07/21/2018   Chronic headaches 11/12/2017   Degeneration of lumbar intervertebral disc 06/13/2017   Stage 3b chronic kidney disease (CKD) (HCC)    Mass of left adrenal gland (HCC) 04/19/2017   Spinal stenosis of lumbar region 10/20/2014   Hyperlipidemia 03/16/2013   Dense breasts 12/22/2012  Anxiety state 04/16/2012   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/16/2012   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   Osteoarthritis of knee 04/16/2012   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 female breast (HCC) 12/20/2010   History of breast cancer 12/05/2010   Depression 09/14/2008   GERD 09/14/2008   Type 2 diabetes mellitus with neurologic complication, with long-term current use of insulin  (HCC) 05/20/2008   Essential hypertension  04/17/2007   Coronary atherosclerosis 04/17/2007   Hypothyroidism 04/03/2007   Past Surgical History:  Procedure Laterality Date   ABDOMINAL AORTOGRAM W/LOWER EXTREMITY Bilateral 04/29/2019   Procedure: ABDOMINAL AORTOGRAM W/LOWER EXTREMITY;  Surgeon: Young Hensen, MD;  Location: [facility];  Service: Cardiovascular;  Laterality: Bilateral;   ABDOMINAL AORTOGRAM W/LOWER EXTREMITY Left 07/01/2019   Procedure: ABDOMINAL AORTOGRAM W/ Left LOWER EXTREMITY [place];  Surgeon: Young Hensen, MD;  Location: [facility];  Service: Cardiovascular;  Laterality: Left;   ABDOMINAL AORTOGRAM W/LOWER EXTREMITY Left 03/03/2021   Procedure: ABDOMINAL AORTOGRAM W/LOWER EXTREMITY;  Surgeon: Carlene Che, MD;  Location: [facility];  Service: Cardiovascular;  Laterality: Left;   ABDOMINAL AORTOGRAM W/LOWER EXTREMITY Right 06/12/2021   Procedure: ABDOMINAL AORTOGRAM W/LOWER EXTREMITY;  Surgeon: Adine Hoof, MD;  Location: [facility] INVASIVE CV LAB;  Service: Cardiovascular;  Laterality: Right;  LOWER LEG   ABDOMINAL AORTOGRAM W/LOWER EXTREMITY N/A 03/07/2022   Procedure: ABDOMINAL AORTOGRAM W/LOWER EXTREMITY;  Surgeon: Kayla Part, MD;  Location: [facility] INVASIVE CV LAB;  Service: Cardiovascular;  Laterality: N/A;   ABDOMINAL AORTOGRAM W/LOWER EXTREMITY N/A 07/18/2022   Procedure: ABDOMINAL AORTOGRAM W/LOWER EXTREMITY;  Surgeon: Kayla Part, MD;  Location: [facility] INVASIVE CV LAB;  Service: Cardiovascular;  Laterality: N/A;   AMPUTATION Left 03/06/2021   Procedure: LEFT ABOVE KNEE AMPUTATION;  Surgeon: Kayla Part, MD;  Location: [facility] OR;  Service: Vascular;  Laterality: Left;   ANTERIOR AND POSTERIOR REPAIR N/A 04/05/2016   Procedure: ANTERIOR (CYSTOCELE) AND POSTERIOR REPAIR (RECTOCELE);  Surgeon: Ashby Lawman, MD;  Location: [facility];  Service: Gynecology;  Laterality: N/A;   AORTIC VALVE REPLACEMENT N/A 05/04/2020   Procedure: AORTIC VALVE REPLACEMENT (AVR)  USING INSPIRIS RESILIA AORTIC VALVE;  Surgeon: Gardenia Jump, MD;  Location: [facility] OR;  Service: Open Heart Surgery;  Laterality: N/A;   BREAST BIOPSY     BREAST LUMPECTOMY  1983   benign biopsy   BREAST LUMPECTOMY Right 12/29/2010   snbx, ER/PR +, Her2 -, 0/1 node pos.   CARPAL TUNNEL RELEASE Bilateral 9/12,8,12   CATARACT EXTRACTION Right 2024   CHOLECYSTECTOMY     COLONOSCOPY N/A 02/09/2013   Procedure: COLONOSCOPY;  Surgeon: Pietro Bridegroom, MD;  Location: [facility];  Service: Endoscopy;  Laterality: N/A;   COLONOSCOPY     CORONARY ANGIOPLASTY     CORONARY ARTERY BYPASS GRAFT N/A 05/04/2020   Procedure: CORONARY ARTERY BYPASS GRAFTING (CABG), ON PUMP, TIMES TWO, USING LEFT INTERNAL MAMMARY ARTERY AND RIGHT ENDOSCOPICALLY HARVESTED GREATER SAPHENOUS VEIN;  Surgeon: Gardenia Jump, MD;  Location: [facility] OR;  Service: Open Heart Surgery;  Laterality: N/A;   DILATION AND CURETTAGE OF UTERUS     ESOPHAGOGASTRODUODENOSCOPY N/A 02/09/2013   Procedure: ESOPHAGOGASTRODUODENOSCOPY (EGD);  Surgeon: Pietro Bridegroom, MD;  Location: [facility];  Service: Endoscopy;  Laterality: N/A;   FOOT SPURS     HYSTEROSCOPY WITH D & C N/A 09/11/2012   Procedure: DILATATION AND CURETTAGE ;  Surgeon: Ashby Lawman, MD;  Location: [facility];  Service: Gynecology;  Laterality: N/A;   KNEE SURGERY  2007   LAPAROSCOPIC ASSISTED VAGINAL HYSTERECTOMY N/A 04/05/2016   Procedure: LAPAROSCOPIC ASSISTED VAGINAL HYSTERECTOMY possible BSO;  Surgeon: Ashby Lawman, MD;  Location: [facility];  Service: Gynecology;  Laterality: N/A;   LUMBAR LAMINECTOMY/DECOMPRESSION MICRODISCECTOMY N/A 10/20/2014   Procedure: MICRO LUMBER DECOMPRESSION L3-4 L4-5;  Surgeon: Orvan Blanch, MD;  Location: [facility];  Service: Orthopedics;  Laterality: N/A;   LUMBAR LAMINECTOMY/DECOMPRESSION MICRODISCECTOMY Bilateral 10/13/2015   Procedure: MICRO LUMBAR DECOMPRESSION L5 - S1 AND REDO DECOMPRESSION L4 - L5 AND REMOVAL OF FACET CYST L4 - L5 2 LEVELS;   Surgeon: Orvan Blanch, MD;  Location: [facility];  Service: Orthopedics;  Laterality: Bilateral;   PERIPHERAL VASCULAR ATHERECTOMY Right 06/12/2021   Procedure: PERIPHERAL VASCULAR ATHERECTOMY;  Surgeon: Adine Hoof, MD;  Location: [facility] INVASIVE CV LAB;  Service: Cardiovascular;  Laterality: Right;  SFA/POP/AT   PERIPHERAL VASCULAR INTERVENTION Right 04/29/2019   Procedure: PERIPHERAL VASCULAR INTERVENTION;  Surgeon: Young Hensen, MD;  Location: [facility];  Service: Cardiovascular;  Laterality: Right;   PERIPHERAL VASCULAR INTERVENTION Right 06/12/2021   Procedure: PERIPHERAL VASCULAR INTERVENTION;  Surgeon: Adine Hoof, MD;  Location: [facility] INVASIVE CV LAB;  Service: Cardiovascular;  Laterality: Right;  SFP/POPLITEAL/AT   PERIPHERAL VASCULAR INTERVENTION  03/07/2022   Procedure: PERIPHERAL VASCULAR INTERVENTION;  Surgeon: Kayla Part, MD;  Location: [facility] INVASIVE CV LAB;  Service: Cardiovascular;;   PERIPHERAL VASCULAR INTERVENTION Right 07/18/2022   Procedure: PERIPHERAL VASCULAR INTERVENTION;  Surgeon: Kayla Part, MD;  Location: [facility] INVASIVE CV LAB;  Service: Cardiovascular;  Laterality: Right;   PORT-A-CATH REMOVAL  04/17/2011   Procedure: REMOVAL PORT-A-CATH;  Surgeon: Enid Harry, MD;  Location: [facility];  Service: General;  Laterality: Left;   PORTACATH PLACEMENT  02/07/2011   Procedure: INSERTION PORT-A-CATH;  Surgeon: Enid Harry, MD;  Location: [facility];  Service: General;  Laterality: N/A;   RIGHT/LEFT HEART CATH AND CORONARY ANGIOGRAPHY N/A 03/17/2020   Procedure: RIGHT/LEFT HEART CATH AND CORONARY ANGIOGRAPHY;  Surgeon: Odie Benne, MD;  Location: [facility];  Service: Cardiovascular;  Laterality: N/A;   TEE WITHOUT CARDIOVERSION N/A 05/04/2020   Procedure: TRANSESOPHAGEAL ECHOCARDIOGRAM (TEE);  Surgeon: Gardenia Jump, MD;  Location: [facility] OR;  Service: Open Heart Surgery;  Laterality: N/A;    UPPER GASTROINTESTINAL ENDOSCOPY     Family History  Problem Relation Age of Onset   Kidney disease Mother    Heart disease Mother    Throat cancer Father    Alcoholism Father    Heart attack Father    Lymphoma Brother 30   Heart attack Brother    Diabetes Brother    Hypertension Brother    Cancer Son    Colon cancer Neg Hx    Stroke Neg Hx    Esophageal cancer Neg Hx    Rectal cancer Neg Hx    Stomach cancer Neg Hx    Outpatient Medications Prior to Visit  Medication Sig Dispense Refill   Accu-Chek FastClix Lancets MISC USE TO CHECK BLOOD SUGARS 3 TIMES DAILY 306 each 3   amLODipine  (NORVASC ) 10 MG tablet TAKE 1 TABLET(10 MG) BY MOUTH DAILY 90 tablet 2   aspirin  EC 81 MG tablet Take 81 mg by mouth in the morning.     blood glucose meter kit and supplies Dispense based on patient and insurance preference. Use up to four times daily as directed. (FOR ICD-10 E10.9, E11.9). 1 each 0  Cholecalciferol 125 MCG (5000 UT) capsule Take 5,000 Units by mouth daily.     clobetasol  ointment (TEMOVATE ) 0.05 % APPLY A THIN LAYER TO THE AFFECTED AREA(S) BY TOPICAL ROUTE 2 TIMES PER DAY     clopidogrel  (PLAVIX ) 75 MG tablet TAKE 1 TABLET(75 MG) BY MOUTH DAILY 30 tablet 11   gatifloxacin (ZYMAXID) 0.5 % SOLN SMARTSIG:In Eye(s)     glipiZIDE  (GLUCOTROL  XL) 10 MG 24 hr tablet TAKE 1 TABLET BY MOUTH DAILY  WITH BREAKFAST 90 tablet 3   glucose blood (ACCU-CHEK GUIDE) test strip TEST ONCE DAILY 100 each 5   Golimumab (SIMPONI ARIA IV) Inject into the vein every 2 (two) months.     insulin  glargine (LANTUS  SOLOSTAR) 100 UNIT/ML Solostar Pen Inject 25 Units into the skin 2 (two) times daily. 15 mL 3   Insulin  Pen Needle (CAREFINE PEN NEEDLES) 32G X 6 MM MISC 1 application by Does not apply route 2 (two) times daily. 120 each 1   LANTUS  SOLOSTAR 100 UNIT/ML Solostar Pen ADMINISTER 25 UNITS UNDER THE SKIN TWICE DAILY 15 mL 3   levothyroxine  (SYNTHROID ) 125 MCG tablet TAKE 1 TABLET BY MOUTH DAILY 90 tablet  3   metoprolol  tartrate (LOPRESSOR ) 50 MG tablet TAKE 1 TABLET BY MOUTH TWICE  DAILY 180 tablet 2   nitrofurantoin  (MACRODANTIN ) 50 MG capsule Take 1 capsule (50 mg total) by mouth daily. 90 capsule 1   OZEMPIC , 0.25 OR 0.5 MG/DOSE, 2 MG/3ML SOPN INJECT SUBCUTANEOUSLY 0.5 MG  WEEKLY 9 mL 3   pantoprazole  (PROTONIX ) 40 MG tablet TAKE 1 TABLET BY MOUTH DAILY 90 tablet 2   PARoxetine  (PAXIL ) 20 MG tablet TAKE 1 TABLET BY MOUTH IN THE  MORNING 90 tablet 3   REPATHA  SURECLICK 140 MG/ML SOAJ INJECT 1 PEN INTO THE SKIN EVERY 14 DAYS 2 mL 11   Semaglutide ,0.25 or 0.5MG /DOS, 2 MG/1.5ML SOPN Inject 0.5 mg into the skin every Tuesday. 3 mL 6   valACYclovir  (VALTREX ) 1000 MG tablet Take 1 tablet (1,000 mg total) by mouth daily as needed (Fever Blisters). 180 tablet 0   No facility-administered medications prior to visit.   Allergies  Allergen Reactions   Codeine Nausea And Vomiting and Other (See Comments)    Severe stomach cramps, also Other reaction(s): Unknown   Antihistamines, Diphenhydramine-Type Other (See Comments)    Causes hyperactivity   Diphenhydramine     Other reaction(s): Unknown   Erythromycin Hives, Rash and Other (See Comments)    Allergic due to dental work from 50 years ago. It was used in packing and resulted in rash/hives inside and outside of mouth.   Folic Acid  Other (See Comments)    Other reaction(s): tongue pain   Gabapentin     Urinary incontinence   Lyrica  [Pregabalin ]     Double vision/falls   Methotrexate Other (See Comments)    Other reaction(s): oral ulcers   Oxycodone      Hallucinations/crazy feelings   Rheumate [Fish Oil]     Other reaction(s): hypoglycemia   Statins Other (See Comments)    Joint pains Other reaction(s): Unknown   Sulfa Antibiotics Other (See Comments)    Reaction not recalled   Trulicity  [Dulaglutide ] Itching   Objective:   Today's Vitals   07/09/23 0800  BP: 124/70  Pulse: 75  Temp: (!) 97 F (36.1 C)  TempSrc: Temporal   SpO2: 96%  Weight: 146 lb 6.4 oz (66.4 kg)  Height: 5' 3 (1.6 m)   Body mass index is 25.93 kg/m.  General: Well developed, well nourished. No acute distress. Extremities: Full ROM of right ankle. There is some puffy swelling laterally over the lateral malleolus. No   increased warmth and this is not tender to touch. No tenderness over the Achilles tendon. Cap refill is sluggish   in the 1st toe. No edema noted. Psych: Alert and oriented. Normal mood and affect.  Health Maintenance Due  Topic Date Due   DTaP/Tdap/Td (1 - Tdap) Never done   Zoster Vaccines- Shingrix (2 of 2) 03/29/2011   Medicare Annual Wellness (AWV)  09/07/2023   Assessment & Plan:   Problem List Items Addressed This Visit       Cardiovascular and Mediastinum   Essential hypertension - Primary   Blood pressure is at goal. Continue amlodipine  10 mg daily and metoprolol  tartrate 50 mg bid. I would consider addition of an SGLT2i, however, she has had an issue with recurrent UTIs, which would be a relative contraindication. I would also consider an ACE-I or ARB if needed.        Endocrine   Hypothyroidism (Chronic)   TSH at goal. Continue levothyroxine  125 mcg daily.      Type 2 diabetes mellitus with neurologic complication, with long-term current use of insulin  (HCC)   Last A1c has been at goal. Continue glipizide  10 mg, semaglutide  (Ozempic ) 0.5 mg weekly, and insulin  glargine 25 units bid.      Relevant Orders   Glucose, random   Hemoglobin A1c     Musculoskeletal and Integument   Erosive osteoarthritis of both hands   Stable. Managing discomfort with Tylenol . Continue to follow with rheumatology. This does impact grip strength for propelling a standard wheelchair. I do recommend an electric chair to facilitate mobility.      Relevant Orders   DME Wheelchair electric   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)   Continue golimumab (Simponi) infusions. Following with  rheumatology.      Relevant Orders   DME Wheelchair electric     Genitourinary   Stage 3b chronic kidney disease (CKD) (HCC)   Stable. Continue focus on blood pressure and glucose control, adequate hydration, and avoidance of nephrotoxic medications.         Other   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HCC)   Stable.      Relevant Orders   DME Wheelchair electric   Hyperlipidemia   LDL cholesterol at goal. Continue Repatha  and Vascepa  1 gm, 2 caps bid.       Other Visit Diagnoses       Acute right ankle pain       Likely due to OA. No sign of inflammation. Recommend expectant management for now.       Return in about 3 months (around 10/09/2023) for Reassessment.   Graig Lawyer, MD

## 2023-07-09 NOTE — Assessment & Plan Note (Addendum)
 Stable

## 2023-07-09 NOTE — Assessment & Plan Note (Signed)
 TSH at goal. Continue levothyroxine 125 mcg daily.

## 2023-07-11 ENCOUNTER — Ambulatory Visit: Payer: Self-pay

## 2023-07-11 ENCOUNTER — Telehealth: Payer: Self-pay | Admitting: Family Medicine

## 2023-07-11 NOTE — Telephone Encounter (Signed)
                    FYI Only or Action Required?: FYI only for provider.  Patient was last seen in primary care on 07/09/2023 by Graig Lawyer, MD. Called Nurse Triage reporting Labs Only. Symptoms began N/A. Interventions attempted: Other: N/A. Symptoms are: N/A.  Triage Disposition: Information or Advice Only Call  Patient/caregiver understands and will follow disposition?: Yes   ** A1C level given             Copied from CRM [id number]. Topic: Clinical - Lab/Test Results >> Jul 11, 2023  3:03 PM [place] A wrote: Reason for CRM: Patient has more questions regarding labs Reason for Disposition  Health Information question, no triage required and triager able to answer question  Answer Assessment - Initial Assessment Questions 1. REASON FOR CALL or QUESTION: What is your reason for calling today? or How can I best help you? or What question do you have that I can help answer?        Patient called into Triage wanting to know her recent A1C, result given as well as message from Dr. Therese Flash, pt. Has no further questions at this time.  Protocols used: Information Only Call - No Triage-A-AH

## 2023-07-11 NOTE — Telephone Encounter (Unsigned)
 Copied from CRM [id number]. Topic: Clinical - Medication Prior Auth >> Jul 11, 2023 11:06 AM Pam Bode wrote: Reason for CRM: [name] [phone] is calling because she needs prior authorization for the wheelchair the patient is asking for.

## 2023-07-14 ENCOUNTER — Other Ambulatory Visit: Payer: Self-pay | Admitting: Vascular Surgery

## 2023-07-15 NOTE — Telephone Encounter (Signed)
 Will call them tomorrow. Dm/cma

## 2023-07-16 NOTE — Telephone Encounter (Signed)
 Called and spoke to [name] with insurance.   MZQ#[id number].  Was advised that the PA would need to have the DME suppliers NPI, address and our information as well.  I don't know where she took the order and advised that I would reach out to patient to have them contact the DME supplier to have them call to do the PA for the Wheel chair.  Called patietn and she will take the order to [facility] to have the order processed (hasn't done it yet).  Dm/cma

## 2023-07-17 ENCOUNTER — Telehealth: Payer: Self-pay

## 2023-07-17 NOTE — Telephone Encounter (Signed)
Will call in the morning. Dm/cma

## 2023-07-17 NOTE — Telephone Encounter (Signed)
 Copied from CRM [id number]. Topic: General - Other >> Jul 17, 2023 10:21 AM [name] wrote: Reason for CRM: Patient called in wanting to speak with Cassandra regarding her prior authorization for a wheelchair, would like a callback

## 2023-07-18 NOTE — Telephone Encounter (Signed)
 Called and spoke to patient and she states that the insurance company talked to someone in our office that will be able to do the PA for the wheel chair.  She still hasn't takne the order to [facility].  Advised her to take it to them and they will be able to help her with this issue. Dm/cma

## 2023-07-22 ENCOUNTER — Telehealth: Payer: Self-pay

## 2023-07-22 NOTE — Telephone Encounter (Signed)
 Spoke to patient and she will have to cover out of pocket.  [facility] no longer accepting [employer] and will stop accepting insurance at all next year.  Patient states that she has taken care of this another way and doesn't need anything else. Dm/cma

## 2023-07-22 NOTE — Telephone Encounter (Signed)
 Copied from CRM [id number]. Topic: General - Other >> Jul 19, 2023  2:40 PM Mercedes MATSU wrote: Reason for CRM: Patient states that she would like a call back in regards to the wheel chair. Patient can be reached at [phone]

## 2023-08-15 ENCOUNTER — Encounter (HOSPITAL_COMMUNITY): Payer: Medicare Other

## 2023-08-15 ENCOUNTER — Ambulatory Visit: Payer: Medicare Other | Admitting: Vascular Surgery

## 2023-09-09 ENCOUNTER — Ambulatory Visit (INDEPENDENT_AMBULATORY_CARE_PROVIDER_SITE_OTHER): Payer: Medicare Other

## 2023-09-09 DIAGNOSIS — Z0000 Encounter for general adult medical examination without abnormal findings: Secondary | ICD-10-CM | POA: Diagnosis not present

## 2023-09-09 NOTE — Progress Notes (Signed)
 Subjective:   [name] is a 83 y.o. who presents for a Medicare Wellness preventive visit.  As a reminder, Annual Wellness Visits don't include a physical exam, and some assessments may be limited, especially if this visit is performed virtually. We may recommend an in-person follow-up visit with your provider if needed.  Visit Complete: Virtual I connected with  [name] on 09/09/23 by a audio enabled telemedicine application and verified that I am speaking with the correct person using two identifiers.  Patient Location: Home  Provider Location: Office/Clinic  I discussed the limitations of evaluation and management by telemedicine. The patient expressed understanding and agreed to proceed.  Vital Signs: Because this visit was a virtual/telehealth visit, some criteria may be missing or patient reported. Any vitals not documented were not able to be obtained and vitals that have been documented are patient reported.  VideoError- Librarian, academic were attempted between this provider and patient, however failed, due to patient having technical difficulties OR patient did not have access to video capability.  We continued and completed visit with audio only.   Persons Participating in Visit: Patient.  AWV Questionnaire: No: Patient Medicare AWV questionnaire was not completed prior to this visit.  Cardiac Risk Factors include: advanced age (>76men, >28 women);diabetes mellitus;dyslipidemia;hypertension     Objective:    Today's Vitals   09/09/23 1001  PainSc: 8    There is no height or weight on file to calculate BMI.     09/09/2023   10:09 AM 09/07/2022   10:19 AM 07/18/2022    6:39 AM 03/07/2022    7:37 AM 02/07/2022   10:57 AM 09/12/2021   11:05 AM 07/04/2021    2:02 PM  Advanced Directives  Does Patient Have a Medical Advance Directive? Yes Yes Yes Yes Yes Yes Yes  Type of Estate agent of [place];Living will  Healthcare Power of [place];Living will Healthcare Power of [place];Living will Healthcare Power of [place];Living will Healthcare Power of [place];Living will Healthcare Power of [place];Living will Healthcare Power of [place];Living will  Does patient want to make changes to medical advance directive?    No - Patient declined   No - Patient declined  Copy of Healthcare Power of Attorney in Chart? Yes - validated most recent copy scanned in chart (See row information) Yes - validated most recent copy scanned in chart (See row information)  No - copy requested No - copy requested No - copy requested No - copy requested    Current Medications (verified) Outpatient Encounter Medications as of 09/09/2023  Medication Sig   Accu-Chek FastClix Lancets MISC USE TO CHECK BLOOD SUGARS 3 TIMES DAILY   amLODipine  (NORVASC ) 10 MG tablet TAKE 1 TABLET(10 MG) BY MOUTH DAILY   aspirin  EC 81 MG tablet Take 81 mg by mouth in the morning.   blood glucose meter kit and supplies Dispense based on patient and insurance preference. Use up to four times daily as directed. (FOR ICD-10 E10.9, E11.9).   Cholecalciferol 125 MCG (5000 UT) capsule Take 5,000 Units by mouth daily.   clobetasol  ointment (TEMOVATE ) 0.05 % APPLY A THIN LAYER TO THE AFFECTED AREA(S) BY TOPICAL ROUTE 2 TIMES PER DAY   clopidogrel  (PLAVIX ) 75 MG tablet TAKE 1 TABLET(75 MG) BY MOUTH DAILY   glipiZIDE  (GLUCOTROL  XL) 10 MG 24 hr tablet TAKE 1 TABLET BY MOUTH DAILY  WITH BREAKFAST   glucose blood (ACCU-CHEK GUIDE) test strip TEST ONCE DAILY   Golimumab (SIMPONI  ARIA IV) Inject into the vein every 2 (two) months.   insulin  glargine (LANTUS  SOLOSTAR) 100 UNIT/ML Solostar Pen Inject 25 Units into the skin 2 (two) times daily.   Insulin  Pen Needle (CAREFINE PEN NEEDLES) 32G X 6 MM MISC 1 application by Does not apply route 2 (two) times daily.   LANTUS  SOLOSTAR 100 UNIT/ML Solostar Pen ADMINISTER 25 UNITS UNDER THE SKIN TWICE DAILY   levothyroxine   (SYNTHROID ) 125 MCG tablet TAKE 1 TABLET BY MOUTH DAILY   metoprolol  tartrate (LOPRESSOR ) 50 MG tablet TAKE 1 TABLET BY MOUTH TWICE  DAILY   OZEMPIC , 0.25 OR 0.5 MG/DOSE, 2 MG/3ML SOPN INJECT SUBCUTANEOUSLY 0.5 MG  WEEKLY   pantoprazole  (PROTONIX ) 40 MG tablet TAKE 1 TABLET BY MOUTH DAILY   PARoxetine  (PAXIL ) 20 MG tablet TAKE 1 TABLET BY MOUTH IN THE  MORNING   REPATHA  SURECLICK 140 MG/ML SOAJ INJECT 1 PEN INTO THE SKIN EVERY 14 DAYS   Semaglutide ,0.25 or 0.5MG /DOS, 2 MG/1.5ML SOPN Inject 0.5 mg into the skin every Tuesday.   valACYclovir  (VALTREX ) 1000 MG tablet Take 1 tablet (1,000 mg total) by mouth daily as needed (Fever Blisters).   gatifloxacin (ZYMAXID) 0.5 % SOLN SMARTSIG:In Eye(s) (Patient not taking: Reported on 09/09/2023)   nitrofurantoin  (MACRODANTIN ) 50 MG capsule Take 1 capsule (50 mg total) by mouth daily. (Patient not taking: Reported on 09/09/2023)   No facility-administered encounter medications on file as of 09/09/2023.    Allergies (verified) Codeine; Antihistamines, diphenhydramine-type; Diphenhydramine; Erythromycin; Folic acid ; Gabapentin; Lyrica  [pregabalin ]; Methotrexate; Oxycodone ; Rheumate [fish oil]; Statins; Sulfa antibiotics; and Trulicity  [dulaglutide ]   History: 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, inv mammary, in situ,ER/PR +,HER2 -   Cancer (HCC)    Carcinoma of breast treated with adjuvant chemotherapy (HCC)    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HCC)    Claudication (HCC)    Coronary artery disease    stent 2007   Depression    Diabetes mellitus    Difficulty sleeping    Diverticulosis 12/10/03   Elevated cholesterol    Generalized weakness    GERD (gastroesophageal reflux disease)    Heart murmur    Hepatitis    as an infant   HSV-1 (herpes simplex virus 1) infection     Hyperlipidemia    Hypertension    Hypothyroidism    Osteoarthritis    PAD (peripheral artery disease) (HCC)    Personal history of chemotherapy    Personal history of radiation therapy    Pneumonia    2014 september and March 2022   Rheumatoid arthritis (HCC)    S/P aortic valve replacement with bioprosthetic valve 05/04/2020   Edwards Inspiris Resilia stented bovine pericardial tissue valve, size 23 mm   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 INVASIVE CV LAB;  Service: Cardiovascular;  Laterality: Bilateral;   ABDOMINAL AORTOGRAM W/LOWER EXTREMITY  Left 07/01/2019   Procedure: ABDOMINAL AORTOGRAM W/ Left LOWER EXTREMITY [place];  Surgeon: Gretta Lonni PARAS, MD;  Location: [facility] INVASIVE CV LAB;  Service: Cardiovascular;  Laterality: Left;   ABDOMINAL AORTOGRAM W/LOWER EXTREMITY Left 03/03/2021   Procedure: ABDOMINAL AORTOGRAM W/LOWER EXTREMITY;  Surgeon: Magda Debby SAILOR, MD;  Location: [facility] INVASIVE CV LAB;  Service: Cardiovascular;  Laterality: Left;   ABDOMINAL AORTOGRAM W/LOWER EXTREMITY Right 06/12/2021   Procedure: ABDOMINAL AORTOGRAM W/LOWER EXTREMITY;  Surgeon: Sheree Penne Lonni, MD;  Location: [facility] INVASIVE CV LAB;  Service: Cardiovascular;  Laterality: Right;  LOWER LEG   ABDOMINAL AORTOGRAM W/LOWER EXTREMITY N/A 03/07/2022   Procedure: ABDOMINAL AORTOGRAM W/LOWER EXTREMITY;  Surgeon: Lanis Fonda BRAVO, MD;  Location: [facility] INVASIVE CV LAB;  Service: Cardiovascular;  Laterality: N/A;   ABDOMINAL AORTOGRAM W/LOWER EXTREMITY N/A 07/18/2022   Procedure: ABDOMINAL AORTOGRAM W/LOWER EXTREMITY;  Surgeon: Lanis Fonda BRAVO, MD;  Location: [facility] INVASIVE  CV LAB;  Service: Cardiovascular;  Laterality: N/A;   AMPUTATION Left 03/06/2021   Procedure: LEFT ABOVE KNEE AMPUTATION;  Surgeon: Lanis Fonda BRAVO, MD;  Location: [facility] OR;  Service: Vascular;  Laterality: Left;   ANTERIOR AND POSTERIOR REPAIR N/A 04/05/2016   Procedure: ANTERIOR (CYSTOCELE) AND POSTERIOR REPAIR (RECTOCELE);  Surgeon: Truman Corona, MD;  Location: [facility] ORS;  Service: Gynecology;  Laterality: N/A;   AORTIC VALVE REPLACEMENT N/A 05/04/2020   Procedure: AORTIC VALVE REPLACEMENT (AVR) USING INSPIRIS RESILIA AORTIC VALVE;  Surgeon: Dusty Sudie DEL, MD;  Location: [facility] OR;  Service: Open Heart Surgery;  Laterality: N/A;   BREAST BIOPSY     BREAST LUMPECTOMY  1983   benign biopsy   BREAST LUMPECTOMY Right 12/29/2010   snbx, ER/PR +, Her2 -, 0/1 node pos.   CARPAL TUNNEL RELEASE Bilateral 9/12,8,12   CATARACT EXTRACTION Right 2024   CHOLECYSTECTOMY     COLONOSCOPY N/A 02/09/2013   Procedure: COLONOSCOPY;  Surgeon: Princella CHRISTELLA Nida, MD;  Location: [facility] ENDOSCOPY;  Service: Endoscopy;  Laterality: N/A;   COLONOSCOPY     CORONARY ANGIOPLASTY     CORONARY ARTERY BYPASS GRAFT N/A 05/04/2020   Procedure: CORONARY ARTERY BYPASS GRAFTING (CABG), ON PUMP, TIMES TWO, USING LEFT INTERNAL MAMMARY ARTERY AND RIGHT ENDOSCOPICALLY HARVESTED GREATER SAPHENOUS VEIN;  Surgeon: Dusty Sudie DEL, MD;  Location: [facility] OR;  Service: Open Heart Surgery;  Laterality: N/A;   DILATION AND CURETTAGE OF UTERUS     ESOPHAGOGASTRODUODENOSCOPY N/A 02/09/2013   Procedure: ESOPHAGOGASTRODUODENOSCOPY (EGD);  Surgeon: Princella CHRISTELLA Nida, MD;  Location: [facility];  Service: Endoscopy;  Laterality: N/A;   FOOT SPURS     HYSTEROSCOPY WITH D & C N/A 09/11/2012   Procedure: DILATATION AND CURETTAGE ;  Surgeon: Truman Corona, MD;  Location: [facility] ORS;  Service: Gynecology;  Laterality: N/A;   KNEE SURGERY  2007   LAPAROSCOPIC ASSISTED VAGINAL HYSTERECTOMY N/A 04/05/2016   Procedure: LAPAROSCOPIC ASSISTED VAGINAL HYSTERECTOMY  possible BSO;  Surgeon: Truman Corona, MD;  Location: [facility] ORS;  Service: Gynecology;  Laterality: N/A;   LUMBAR LAMINECTOMY/DECOMPRESSION MICRODISCECTOMY N/A 10/20/2014   Procedure: MICRO LUMBER DECOMPRESSION L3-4 L4-5;  Surgeon: Reyes Billing, MD;  Location: [facility] ORS;  Service: Orthopedics;  Laterality: N/A;   LUMBAR LAMINECTOMY/DECOMPRESSION MICRODISCECTOMY Bilateral 10/13/2015   Procedure: MICRO LUMBAR DECOMPRESSION L5 - S1 AND REDO DECOMPRESSION L4 - L5 AND REMOVAL OF FACET CYST L4 - L5 2 LEVELS;  Surgeon: Reyes Billing, MD;  Location: [facility] ORS;  Service: Orthopedics;  Laterality: Bilateral;   PERIPHERAL VASCULAR ATHERECTOMY Right 06/12/2021   Procedure: PERIPHERAL VASCULAR  ATHERECTOMY;  Surgeon: Sheree Penne Bruckner, MD;  Location: [facility] INVASIVE CV LAB;  Service: Cardiovascular;  Laterality: Right;  SFA/POP/AT   PERIPHERAL VASCULAR INTERVENTION Right 04/29/2019   Procedure: PERIPHERAL VASCULAR INTERVENTION;  Surgeon: Gretta Bruckner PARAS, MD;  Location: [facility] INVASIVE CV LAB;  Service: Cardiovascular;  Laterality: Right;   PERIPHERAL VASCULAR INTERVENTION Right 06/12/2021   Procedure: PERIPHERAL VASCULAR INTERVENTION;  Surgeon: Sheree Penne Bruckner, MD;  Location: [facility] INVASIVE CV LAB;  Service: Cardiovascular;  Laterality: Right;  SFP/POPLITEAL/AT   PERIPHERAL VASCULAR INTERVENTION  03/07/2022   Procedure: PERIPHERAL VASCULAR INTERVENTION;  Surgeon: Lanis Fonda BRAVO, MD;  Location: [facility] INVASIVE CV LAB;  Service: Cardiovascular;;   PERIPHERAL VASCULAR INTERVENTION Right 07/18/2022   Procedure: PERIPHERAL VASCULAR INTERVENTION;  Surgeon: Lanis Fonda BRAVO, MD;  Location: [facility] INVASIVE CV LAB;  Service: Cardiovascular;  Laterality: Right;   PORT-A-CATH REMOVAL  04/17/2011   Procedure: REMOVAL PORT-A-CATH;  Surgeon: Donnice Bury, MD;  Location: [facility];  Service: General;  Laterality: Left;   PORTACATH PLACEMENT  02/07/2011   Procedure: INSERTION PORT-A-CATH;  Surgeon: Donnice Bury, MD;  Location: [facility] ORS;  Service: General;  Laterality: N/A;   RIGHT/LEFT HEART CATH AND CORONARY ANGIOGRAPHY N/A 03/17/2020   Procedure: RIGHT/LEFT HEART CATH AND CORONARY ANGIOGRAPHY;  Surgeon: Verlin Bruckner BIRCH, MD;  Location: [facility] INVASIVE CV LAB;  Service: Cardiovascular;  Laterality: N/A;   TEE WITHOUT CARDIOVERSION N/A 05/04/2020   Procedure: TRANSESOPHAGEAL ECHOCARDIOGRAM (TEE);  Surgeon: Dusty Sudie DEL, MD;  Location: [facility] OR;  Service: Open Heart Surgery;  Laterality: N/A;   UPPER GASTROINTESTINAL ENDOSCOPY     Family History  Problem Relation Age of Onset   Kidney disease Mother    Heart disease Mother    Throat cancer Father    Alcoholism Father    Heart attack Father    Lymphoma Brother 87   Heart attack Brother    Diabetes Brother    Hypertension Brother    Cancer Son    Colon cancer Neg Hx    Stroke Neg Hx    Esophageal cancer Neg Hx    Rectal cancer Neg Hx    Stomach cancer Neg Hx    Social History   Socioeconomic History   Marital status: Married    Spouse name: Not on file   Number of children: 3   Years of education: Not on file   Highest education level: Not on file  Occupational History   Occupation: retired-bookkeeping  Tobacco Use   Smoking status: Former    Current packs/day: 0.00    Average packs/day: 2.0 packs/day for 40.0 years (80.0 ttl pk-yrs)    Types: Cigarettes    Start date: 12/19/1957    Quit date: 12/19/1997    Years since quitting: 25.7   Smokeless tobacco: Never  Vaping Use   Vaping status: Never Used  Substance and Sexual Activity   Alcohol use: Yes    Alcohol/week: 1.0 standard drink of alcohol    Types: 1 Glasses of wine per week    Comment: occasiona - maybe once a month   Drug use: No   Sexual activity: Not Currently    Comment: menarch age 67, P38, HRT X 10 YRS, MENOPAUSE MID 40'S  Other Topics Concern   Not on file  Social History Narrative   One daughter died from suicide at age 27.   Social Drivers  of Health   Financial Resource Strain: Low Risk  (09/09/2023)   Overall Financial Resource Strain (CARDIA)  Difficulty of Paying Living Expenses: Not hard at all  Food Insecurity: No Food Insecurity (09/09/2023)   Hunger Vital Sign    Worried About Running Out of Food in the Last Year: Never true    Ran Out of Food in the Last Year: Never true  Transportation Needs: No Transportation Needs (09/09/2023)   PRAPARE - Administrator, Civil Service (Medical): No    Lack of Transportation (Non-Medical): No  Physical Activity: Inactive (09/09/2023)   Exercise Vital Sign    Days of Exercise per Week: 0 days    Minutes of Exercise per Session: 0 min  Stress: No Stress Concern Present (09/09/2023)   [employer] of Occupational Health - Occupational Stress Questionnaire    Feeling of Stress: Not at all  Social Connections: Moderately Isolated (09/09/2023)   Social Connection and Isolation Panel    Frequency of Communication with Friends and Family: More than three times a week    Frequency of Social Gatherings with Friends and Family: More than three times a week    Attends Religious Services: Never    Database administrator or Organizations: No    Attends Engineer, structural: Never    Marital Status: Married    Tobacco Counseling Counseling given: Not Answered    Clinical Intake:  Pre-visit preparation completed: Yes  Pain : 0-10 Pain Score: 8  Pain Type: Acute pain Pain Location: Wrist Pain Orientation: Right Pain Descriptors / Indicators: Aching Pain Onset: Yesterday Pain Frequency: Intermittent     Nutritional Risks: None Diabetes: Yes CBG done?: No Did pt. bring in CBG monitor from home?: No  Lab Results  Component Value Date   HGBA1C 7.1 (H) 07/09/2023   HGBA1C 6.9 (H) 04/08/2023   HGBA1C 7.6 (H) 01/08/2023     How often do you need to have someone help you when you read instructions, pamphlets, or other written materials from your  doctor or pharmacy?: 1 - Never  Interpreter Needed?: No  Information entered by :: NAllen LPN   Activities of Daily Living     09/09/2023   10:04 AM  In your present state of health, do you have any difficulty performing the following activities:  Hearing? 1  Comment has hearing aids  Vision? 0  Difficulty concentrating or making decisions? 0  Walking or climbing stairs? 1  Comment amputee  Dressing or bathing? 0  Doing errands, shopping? 0  Preparing Food and eating ? N  Using the Toilet? N  In the past six months, have you accidently leaked urine? Y  Do you have problems with loss of bowel control? N  Managing your Medications? N  Managing your Finances? N  Housekeeping or managing your Housekeeping? Y    Patient Care Team: Thedora Garnette HERO, MD as PCP - General (Family Medicine) Delford Maude BROCKS, MD as PCP - Cardiology (Cardiology) Delford Maude BROCKS, MD as Consulting Physician (Cardiology) Sheree Penne Bruckner, MD as Consulting Physician (Vascular Surgery) Ishmael Slough, MD as Consulting Physician (Rheumatology) Abigail, Maude [facility]) [place], [facility]  I have updated your Care Teams any recent Medical Services you may have received from other providers in the past year.     Assessment:   This is a routine wellness examination for [name].  Hearing/Vision screen Hearing Screening - Comments:: Has hearing aids that are maintained Vision Screening - Comments:: Regular eye exams, Dr. Abigail   Goals Addressed  This Visit's Progress    Patient Stated       09/09/2023, denies goals       Depression Screen     09/09/2023   10:11 AM 07/09/2023    8:12 AM 04/08/2023    8:42 AM 01/08/2023    8:36 AM 09/07/2022   10:23 AM 07/04/2022    7:59 AM 04/03/2022    8:28 AM  PHQ 2/9 Scores  PHQ - 2 Score 0 0 0 0 0 0 0  PHQ- 9 Score 0 0 0 2 1 3  0    Fall Risk     09/09/2023   10:10 AM 04/08/2023    8:41 AM 09/07/2022   10:21 AM 07/04/2022     7:59 AM 09/12/2021   11:05 AM  Fall Risk   Falls in the past year? 0 1 1 1 1   Comment   when first got prosthesis    Number falls in past yr: 0 0 1 1 0  Injury with Fall? 0 0 0 1 0  Risk for fall due to : Impaired mobility;Impaired balance/gait;Medication side effect History of fall(s) Impaired balance/gait;Impaired mobility;History of fall(s);Medication side effect History of fall(s) Impaired balance/gait;Orthopedic patient  Risk for fall due to: Comment     prosthetic left leg  Follow up Falls evaluation completed;Falls prevention discussed Falls evaluation completed Falls prevention discussed;Falls evaluation completed Falls evaluation completed Falls evaluation completed;Education provided;Falls prevention discussed      Data saved with a previous flowsheet row definition    MEDICARE RISK AT HOME:  Medicare Risk at Home Any stairs in or around the home?: Yes If so, are there any without handrails?: No Home free of loose throw rugs in walkways, pet beds, electrical cords, etc?: Yes Adequate lighting in your home to reduce risk of falls?: Yes Life alert?: No Use of a cane, walker or w/c?: Yes Grab bars in the bathroom?: Yes Shower chair or bench in shower?: Yes Elevated toilet seat or a handicapped toilet?: Yes  TIMED UP AND GO:  Was the test performed?  No  Cognitive Function: 6CIT completed        09/09/2023   10:11 AM 09/07/2022   10:24 AM 09/12/2021   11:10 AM  6CIT Screen  What Year? 0 points 0 points 0 points  What month? 0 points 0 points 0 points  What time? 0 points 0 points 0 points  Count back from 20 0 points 0 points 0 points  Months in reverse 0 points 0 points 0 points  Repeat phrase 0 points 0 points 0 points  Total Score 0 points 0 points 0 points    Immunizations Immunization History  Administered Date(s) Administered   Fluad Quad(high Dose 65+) 11/20/2018   Influenza Split 12/07/2014   Influenza, High Dose Seasonal PF 11/26/2016    Influenza,inj,quad, With Preservative 02/22/2017, 11/20/2018   Influenza-Unspecified 10/31/2009, 12/01/2010, 12/03/2011, 11/05/2012, 11/05/2013, 10/25/2014, 10/07/2019, 11/28/2020, 10/05/2021, 10/06/2021   PFIZER(Purple Top)SARS-COV-2 Vaccination 02/12/2019, 03/05/2019, 10/24/2019   Pneumococcal Conjugate-13 07/30/2014   Pneumococcal Polysaccharide-23 11/19/2007, 11/15/2008   Pneumococcal-Unspecified 01/23/2010   Zoster Recombinant(Shingrix) 02/01/2011    Screening Tests Health Maintenance  Topic Date Due   DTaP/Tdap/Td (1 - Tdap) Never done   Diabetic kidney evaluation - Urine ACR  05/13/2009   Zoster Vaccines- Shingrix (2 of 2) 03/29/2011   INFLUENZA VACCINE  08/23/2023   OPHTHALMOLOGY EXAM  10/01/2023   Diabetic kidney evaluation - eGFR measurement  01/08/2024   HEMOGLOBIN A1C  01/08/2024   FOOT EXAM  04/07/2024   Medicare Annual Wellness (AWV)  09/08/2024   Pneumococcal Vaccine: 50+ Years  Completed   DEXA SCAN  Completed   HPV VACCINES  Aged Out   Meningococcal B Vaccine  Aged Out   Pneumococcal Vaccine  Discontinued   Colonoscopy  Discontinued   COVID-19 Vaccine  Discontinued   Hepatitis C Screening  Discontinued    Health Maintenance  Health Maintenance Due  Topic Date Due   DTaP/Tdap/Td (1 - Tdap) Never done   Diabetic kidney evaluation - Urine ACR  05/13/2009   Zoster Vaccines- Shingrix (2 of 2) 03/29/2011   INFLUENZA VACCINE  08/23/2023   Health Maintenance Items Addressed: Due for TDAP, shingles and flu vaccine. Due for UACR.  Additional Screening:  Vision Screening: Recommended annual ophthalmology exams for early detection of glaucoma and other disorders of the eye. Would you like a referral to an eye doctor? No    Dental Screening: Recommended annual dental exams for proper oral hygiene  Community Resource Referral / Chronic Care Management: CRR required this visit?  No   CCM required this visit?  No   Plan:    I have personally reviewed and  noted the following in the patient's chart:   Medical and social history Use of alcohol, tobacco or illicit drugs  Current medications and supplements including opioid prescriptions. Patient is not currently taking opioid prescriptions. Functional ability and status Nutritional status Physical activity Advanced directives List of other physicians Hospitalizations, surgeries, and ER visits in previous 12 months Vitals Screenings to include cognitive, depression, and falls Referrals and appointments  In addition, I have reviewed and discussed with patient certain preventive protocols, quality metrics, and best practice recommendations. A written personalized care plan for preventive services as well as general preventive health recommendations were provided to patient.   Ardella FORBES Dawn, LPN   [id number]   After Visit Summary: (Pick Up) Due to this being a telephonic visit, with patients personalized plan was offered to patient and patient has requested to Pick up at office.  Notes: Nothing significant to report at this time.

## 2023-09-09 NOTE — Patient Instructions (Signed)
 [name] , Thank you for taking time out of your busy schedule to complete your Annual Wellness Visit with me. I enjoyed our conversation and look forward to speaking with you again next year. I, as well as your care team,  appreciate your ongoing commitment to your health goals. Please review the following plan we discussed and let me know if I can assist you in the future. Your Game plan/ To Do List    Referrals: If you haven't heard from the office you've been referred to, please reach out to them at the phone provided.   Follow up Visits: We will see or speak with you next year for your Next Medicare AWV with our clinical staff Have you seen your provider in the last 6 months (3 months if uncontrolled diabetes)? Yes  Clinician Recommendations:  Aim for 30 minutes of exercise or brisk walking, 6-8 glasses of water, and 5 servings of fruits and vegetables each day.       This is a list of the screenings recommended for you:  Health Maintenance  Topic Date Due   DTaP/Tdap/Td vaccine (1 - Tdap) Never done   Yearly kidney health urinalysis for diabetes  05/13/2009   Zoster (Shingles) Vaccine (2 of 2) 03/29/2011   Flu Shot  08/23/2023   Eye exam for diabetics  10/01/2023   Yearly kidney function blood test for diabetes  01/08/2024   Hemoglobin A1C  01/08/2024   Complete foot exam   04/07/2024   Medicare Annual Wellness Visit  09/08/2024   Pneumococcal Vaccine for age over 59  Completed   DEXA scan (bone density measurement)  Completed   HPV Vaccine  Aged Out   Meningitis B Vaccine  Aged Out   Pneumococcal Vaccine  Discontinued   Colon Cancer Screening  Discontinued   COVID-19 Vaccine  Discontinued   Hepatitis C Screening  Discontinued    Advanced directives: (In Chart) A copy of your advanced directives are scanned into your chart should your provider ever need it. Advance Care Planning is important because it:  [x]  Makes sure you receive the medical care that is consistent  with your values, goals, and preferences  [x]  It provides guidance to your family and loved ones and reduces their decisional burden about whether or not they are making the right decisions based on your wishes.  Follow the link provided in your after visit summary or read over the paperwork we have mailed to you to help you started getting your Advance Directives in place. If you need assistance in completing these, please reach out to us  so that we can help you!  See attachments for Preventive Care and Fall Prevention Tips.

## 2023-10-16 ENCOUNTER — Ambulatory Visit (INDEPENDENT_AMBULATORY_CARE_PROVIDER_SITE_OTHER): Admitting: Family Medicine

## 2023-10-16 ENCOUNTER — Encounter: Payer: Self-pay | Admitting: Family Medicine

## 2023-10-16 ENCOUNTER — Ambulatory Visit: Payer: Self-pay | Admitting: Family Medicine

## 2023-10-16 VITALS — BP 122/70 | HR 78 | Temp 97.1°F | Ht 63.0 in | Wt 148.4 lb

## 2023-10-16 DIAGNOSIS — E1142 Type 2 diabetes mellitus with diabetic polyneuropathy: Secondary | ICD-10-CM | POA: Diagnosis not present

## 2023-10-16 DIAGNOSIS — N1832 Chronic kidney disease, stage 3b: Secondary | ICD-10-CM

## 2023-10-16 DIAGNOSIS — E039 Hypothyroidism, unspecified: Secondary | ICD-10-CM

## 2023-10-16 DIAGNOSIS — Z794 Long term (current) use of insulin: Secondary | ICD-10-CM

## 2023-10-16 DIAGNOSIS — G8929 Other chronic pain: Secondary | ICD-10-CM | POA: Insufficient documentation

## 2023-10-16 DIAGNOSIS — I10 Essential (primary) hypertension: Secondary | ICD-10-CM

## 2023-10-16 DIAGNOSIS — M19011 Primary osteoarthritis, right shoulder: Secondary | ICD-10-CM | POA: Insufficient documentation

## 2023-10-16 DIAGNOSIS — M25512 Pain in left shoulder: Secondary | ICD-10-CM | POA: Diagnosis not present

## 2023-10-16 DIAGNOSIS — Z7985 Long-term (current) use of injectable non-insulin antidiabetic drugs: Secondary | ICD-10-CM

## 2023-10-16 DIAGNOSIS — E785 Hyperlipidemia, unspecified: Secondary | ICD-10-CM

## 2023-10-16 DIAGNOSIS — Z7984 Long term (current) use of oral hypoglycemic drugs: Secondary | ICD-10-CM

## 2023-10-16 DIAGNOSIS — I739 Peripheral vascular disease, unspecified: Secondary | ICD-10-CM

## 2023-10-16 LAB — MICROALBUMIN / CREATININE URINE RATIO
Creatinine,U: 103.9 mg/dL
Microalb Creat Ratio: 1491 mg/g — ABNORMAL HIGH (ref 0.0–30.0)
Microalb, Ur: 154.9 mg/dL — ABNORMAL HIGH (ref 0.0–1.9)

## 2023-10-16 LAB — GLUCOSE, RANDOM: Glucose, Bld: 116 mg/dL — ABNORMAL HIGH (ref 70–99)

## 2023-10-16 LAB — HEMOGLOBIN A1C: Hgb A1c MFr Bld: 7.6 % — ABNORMAL HIGH (ref 4.6–6.5)

## 2023-10-16 MED ORDER — SEMAGLUTIDE (1 MG/DOSE) 4 MG/3ML ~~LOC~~ SOPN: 1.0000 mg | PEN_INJECTOR | SUBCUTANEOUS | 3 refills | Status: AC

## 2023-10-16 NOTE — Assessment & Plan Note (Signed)
 Blood pressure is at goal. Continue amlodipine  10 mg daily and metoprolol  tartrate 50 mg bid.

## 2023-10-16 NOTE — Progress Notes (Signed)
 [facility] [address] [place] [address] Dept: [phone] Dept Fax: [phone]  Chronic Care Office Visit  Subjective:    Patient ID: [name], female    DOB: 01-20-1941, 83 y.o..   MRN: [id number]  Chief Complaint  Patient presents with   Diabetes    3 month f/u DM/HTN.  C/o having LT shoulder pain off/on.  Tylenol  taken.  Fasting today.     History of Present Illness:  Patient is in today for reassessment of chronic medical issues.  Ms. [name] has a history of peripheral artery disease. 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  She now has a prosthesis. She has severe PAD of the right leg, which is being followed by vascular surgery. She had a new stent placed in June 2024. She notes the vascular surgeon feels her arterial flow is good currently. She has noted two small areas of bruising on her right medial calf. She is not aware of any specific trauma to this area.   Ms. [name] has a past history of aortic stenosis and coronary artery disease. She had CABG x 2 and aortic valve replacement in 04/2020. She also has hypertension and hyperlipidemia.  She is managed on aspirin  81 mg daily, clopidogrel  75 mg daily, metoprolol  tartrate 50 mg bid, and amlodipine  10 mg daily. She has been intolerant of statins and is on Repatha  and Vascepa .   Ms. [name] has Type 2 diabetes. She is managed on glipizide  10 mg, semaglutide  (Ozempic ) 0.5 mg weekly, and insulin  glargine (Lantus ) 25 units bid. Her diabetes is complicated with peripheral neuropathy.   Ms. [name] has a history of hypothyroidism. She is managed on levothyroxine  125 mcg daily.    Ms. [name] has a history of rheumatoid arthritis and is on periodic infusions of Simponi. She also has erosive osteoarthritis of the hands. She has been having an issue with increased pain in her left shoudler. She is  finding this is limiting her use of her left arm.  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) 04/07/2019   Scoliosis deformity of spine 02/03/2019   Osteoarthritis of hip 11/17/2018   Claudication    Recurrent UTI 07/21/2018   Chronic headaches 11/12/2017   Degeneration of lumbar intervertebral disc 06/13/2017   Stage 3b chronic kidney disease (CKD) (HCC)    Mass of left adrenal gland 04/19/2017   Spinal stenosis of lumbar region 10/20/2014   Hyperlipidemia 03/16/2013   Dense breasts 12/22/2012   Anxiety state 04/16/2012   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/16/2012   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   Osteoarthritis of knee 04/16/2012   History of breast cancer 12/05/2010   Depression 09/14/2008   GERD 09/14/2008   Type 2 diabetes mellitus with neurologic complication, with long-term current use of insulin  (HCC)  05/20/2008   Essential hypertension 04/17/2007   Coronary atherosclerosis 04/17/2007   Hypothyroidism 04/03/2007   Past Surgical History:  Procedure Laterality Date   ABDOMINAL AORTOGRAM W/LOWER EXTREMITY Bilateral 04/29/2019   Procedure: ABDOMINAL AORTOGRAM W/LOWER EXTREMITY;  Surgeon: Gretta Lonni PARAS, MD;  Location: [facility];  Service: Cardiovascular;  Laterality: Bilateral;   ABDOMINAL AORTOGRAM W/LOWER EXTREMITY Left 07/01/2019    Procedure: ABDOMINAL AORTOGRAM W/ Left LOWER EXTREMITY Runoff;  Surgeon: Gretta Lonni PARAS, MD;  Location: [facility];  Service: Cardiovascular;  Laterality: Left;   ABDOMINAL AORTOGRAM W/LOWER EXTREMITY Left 03/03/2021   Procedure: ABDOMINAL AORTOGRAM W/LOWER EXTREMITY;  Surgeon: Magda Debby SAILOR, MD;  Location: [facility];  Service: Cardiovascular;  Laterality: Left;   ABDOMINAL AORTOGRAM W/LOWER EXTREMITY Right 06/12/2021   Procedure: ABDOMINAL AORTOGRAM W/LOWER EXTREMITY;  Surgeon: Sheree Penne Lonni, MD;  Location: [facility] INVASIVE CV LAB;  Service: Cardiovascular;  Laterality: Right;  LOWER LEG   ABDOMINAL AORTOGRAM W/LOWER EXTREMITY N/A 03/07/2022   Procedure: ABDOMINAL AORTOGRAM W/LOWER EXTREMITY;  Surgeon: Lanis Fonda BRAVO, MD;  Location: [facility] INVASIVE CV LAB;  Service: Cardiovascular;  Laterality: N/A;   ABDOMINAL AORTOGRAM W/LOWER EXTREMITY N/A 07/18/2022   Procedure: ABDOMINAL AORTOGRAM W/LOWER EXTREMITY;  Surgeon: Lanis Fonda BRAVO, MD;  Location: [facility] INVASIVE CV LAB;  Service: Cardiovascular;  Laterality: N/A;   AMPUTATION Left 03/06/2021   Procedure: LEFT ABOVE KNEE AMPUTATION;  Surgeon: Lanis Fonda BRAVO, MD;  Location: [facility] OR;  Service: Vascular;  Laterality: Left;   ANTERIOR AND POSTERIOR REPAIR N/A 04/05/2016   Procedure: ANTERIOR (CYSTOCELE) AND POSTERIOR REPAIR (RECTOCELE);  Surgeon: Truman Corona, MD;  Location: [facility];  Service: Gynecology;  Laterality: N/A;   AORTIC VALVE REPLACEMENT N/A 05/04/2020   Procedure: AORTIC VALVE REPLACEMENT (AVR) USING INSPIRIS RESILIA AORTIC VALVE;  Surgeon: Dusty Sudie DEL, MD;  Location: [facility] OR;  Service: Open Heart Surgery;  Laterality: N/A;   BREAST BIOPSY     BREAST LUMPECTOMY  1983   benign biopsy   BREAST LUMPECTOMY Right 12/29/2010   snbx, ER/PR +, Her2 -, 0/1 node pos.   CARPAL TUNNEL RELEASE Bilateral 9/12,8,12   CATARACT EXTRACTION Right 2024   CHOLECYSTECTOMY     COLONOSCOPY N/A 02/09/2013   Procedure:  COLONOSCOPY;  Surgeon: Princella CHRISTELLA Nida, MD;  Location: [facility];  Service: Endoscopy;  Laterality: N/A;   COLONOSCOPY     CORONARY ANGIOPLASTY     CORONARY ARTERY BYPASS GRAFT N/A 05/04/2020   Procedure: CORONARY ARTERY BYPASS GRAFTING (CABG), ON PUMP, TIMES TWO, USING LEFT INTERNAL MAMMARY ARTERY AND RIGHT ENDOSCOPICALLY HARVESTED GREATER SAPHENOUS VEIN;  Surgeon: Dusty Sudie DEL, MD;  Location: [facility] OR;  Service: Open Heart Surgery;  Laterality: N/A;   DILATION AND CURETTAGE OF UTERUS     ESOPHAGOGASTRODUODENOSCOPY N/A 02/09/2013   Procedure: ESOPHAGOGASTRODUODENOSCOPY (EGD);  Surgeon: Princella CHRISTELLA Nida, MD;  Location: [facility];  Service: Endoscopy;  Laterality: N/A;   FOOT SPURS     HYSTEROSCOPY WITH D & C N/A 09/11/2012   Procedure: DILATATION AND CURETTAGE ;  Surgeon: Truman Corona, MD;  Location: [facility];  Service: Gynecology;  Laterality: N/A;   KNEE SURGERY  2007   LAPAROSCOPIC ASSISTED VAGINAL HYSTERECTOMY N/A 04/05/2016   Procedure: LAPAROSCOPIC ASSISTED VAGINAL HYSTERECTOMY possible BSO;  Surgeon: Truman Corona, MD;  Location: [facility];  Service: Gynecology;  Laterality: N/A;   LUMBAR LAMINECTOMY/DECOMPRESSION MICRODISCECTOMY N/A 10/20/2014   Procedure: MICRO LUMBER DECOMPRESSION L3-4 L4-5;  Surgeon: Reyes Billing, MD;  Location: [facility];  Service:  Orthopedics;  Laterality: N/A;   LUMBAR LAMINECTOMY/DECOMPRESSION MICRODISCECTOMY Bilateral 10/13/2015   Procedure: MICRO LUMBAR DECOMPRESSION L5 - S1 AND REDO DECOMPRESSION L4 - L5 AND REMOVAL OF FACET CYST L4 - L5 2 LEVELS;  Surgeon: Reyes Billing, MD;  Location: [facility];  Service: Orthopedics;  Laterality: Bilateral;   PERIPHERAL VASCULAR ATHERECTOMY Right 06/12/2021   Procedure: PERIPHERAL VASCULAR ATHERECTOMY;  Surgeon: Sheree Penne Bruckner, MD;  Location: [facility] INVASIVE CV LAB;  Service: Cardiovascular;  Laterality: Right;  SFA/POP/AT   PERIPHERAL VASCULAR INTERVENTION Right 04/29/2019   Procedure: PERIPHERAL VASCULAR INTERVENTION;   Surgeon: Gretta Bruckner PARAS, MD;  Location: [facility];  Service: Cardiovascular;  Laterality: Right;   PERIPHERAL VASCULAR INTERVENTION Right 06/12/2021   Procedure: PERIPHERAL VASCULAR INTERVENTION;  Surgeon: Sheree Penne Bruckner, MD;  Location: [facility] INVASIVE CV LAB;  Service: Cardiovascular;  Laterality: Right;  SFP/POPLITEAL/AT   PERIPHERAL VASCULAR INTERVENTION  03/07/2022   Procedure: PERIPHERAL VASCULAR INTERVENTION;  Surgeon: Lanis Fonda BRAVO, MD;  Location: [facility] INVASIVE CV LAB;  Service: Cardiovascular;;   PERIPHERAL VASCULAR INTERVENTION Right 07/18/2022   Procedure: PERIPHERAL VASCULAR INTERVENTION;  Surgeon: Lanis Fonda BRAVO, MD;  Location: [facility] INVASIVE CV LAB;  Service: Cardiovascular;  Laterality: Right;   PORT-A-CATH REMOVAL  04/17/2011   Procedure: REMOVAL PORT-A-CATH;  Surgeon: Donnice Bury, MD;  Location: [facility];  Service: General;  Laterality: Left;   PORTACATH PLACEMENT  02/07/2011   Procedure: INSERTION PORT-A-CATH;  Surgeon: Donnice Bury, MD;  Location: [facility];  Service: General;  Laterality: N/A;   RIGHT/LEFT HEART CATH AND CORONARY ANGIOGRAPHY N/A 03/17/2020   Procedure: RIGHT/LEFT HEART CATH AND CORONARY ANGIOGRAPHY;  Surgeon: Verlin Bruckner BIRCH, MD;  Location: [facility];  Service: Cardiovascular;  Laterality: N/A;   TEE WITHOUT CARDIOVERSION N/A 05/04/2020   Procedure: TRANSESOPHAGEAL ECHOCARDIOGRAM (TEE);  Surgeon: Dusty Sudie DEL, MD;  Location: [facility] OR;  Service: Open Heart Surgery;  Laterality: N/A;   UPPER GASTROINTESTINAL ENDOSCOPY     Family History  Problem Relation Age of Onset   Kidney disease Mother    Heart disease Mother    Throat cancer Father    Alcoholism Father    Heart attack Father    Lymphoma Brother 18   Heart attack Brother    Diabetes Brother    Hypertension Brother    Cancer Son    Colon cancer Neg Hx    Stroke Neg Hx    Esophageal cancer Neg Hx    Rectal cancer Neg Hx    Stomach  cancer Neg Hx    Outpatient Medications Prior to Visit  Medication Sig Dispense Refill   Accu-Chek FastClix Lancets MISC USE TO CHECK BLOOD SUGARS 3 TIMES DAILY 306 each 3   amLODipine  (NORVASC ) 10 MG tablet TAKE 1 TABLET(10 MG) BY MOUTH DAILY 90 tablet 2   aspirin  EC 81 MG tablet Take 81 mg by mouth in the morning.     blood glucose meter kit and supplies Dispense based on patient and insurance preference. Use up to four times daily as directed. (FOR ICD-10 E10.9, E11.9). 1 each 0   Cholecalciferol 125 MCG (5000 UT) capsule Take 5,000 Units by mouth daily.     clobetasol  ointment (TEMOVATE ) 0.05 % APPLY A THIN LAYER TO THE AFFECTED AREA(S) BY TOPICAL ROUTE 2 TIMES PER DAY     clopidogrel  (PLAVIX ) 75 MG tablet TAKE 1 TABLET(75 MG) BY MOUTH DAILY 30 tablet 11   glipiZIDE  (GLUCOTROL  XL) 10 MG 24 hr tablet TAKE 1 TABLET  BY MOUTH DAILY  WITH BREAKFAST 90 tablet 3   glucose blood (ACCU-CHEK GUIDE) test strip TEST ONCE DAILY 100 each 5   Golimumab (SIMPONI ARIA IV) Inject into the vein every 2 (two) months.     Insulin  Pen Needle (CAREFINE PEN NEEDLES) 32G X 6 MM MISC 1 application by Does not apply route 2 (two) times daily. 120 each 1   LANTUS  SOLOSTAR 100 UNIT/ML Solostar Pen ADMINISTER 25 UNITS UNDER THE SKIN TWICE DAILY 15 mL 3   levothyroxine  (SYNTHROID ) 125 MCG tablet TAKE 1 TABLET BY MOUTH DAILY 90 tablet 3   metoprolol  tartrate (LOPRESSOR ) 50 MG tablet TAKE 1 TABLET BY MOUTH TWICE  DAILY 180 tablet 2   OZEMPIC , 0.25 OR 0.5 MG/DOSE, 2 MG/3ML SOPN INJECT SUBCUTANEOUSLY 0.5 MG  WEEKLY 9 mL 3   pantoprazole  (PROTONIX ) 40 MG tablet TAKE 1 TABLET BY MOUTH DAILY 90 tablet 2   PARoxetine  (PAXIL ) 20 MG tablet TAKE 1 TABLET BY MOUTH IN THE  MORNING 90 tablet 3   REPATHA  SURECLICK 140 MG/ML SOAJ INJECT 1 PEN INTO THE SKIN EVERY 14 DAYS 2 mL 11   valACYclovir  (VALTREX ) 1000 MG tablet Take 1 tablet (1,000 mg total) by mouth daily as needed (Fever Blisters). 180 tablet 0   insulin  glargine (LANTUS   SOLOSTAR) 100 UNIT/ML Solostar Pen Inject 25 Units into the skin 2 (two) times daily. 15 mL 3   Semaglutide ,0.25 or 0.5MG /DOS, 2 MG/1.5ML SOPN Inject 0.5 mg into the skin every Tuesday. 3 mL 6   gatifloxacin (ZYMAXID) 0.5 % SOLN SMARTSIG:In Eye(s) (Patient not taking: Reported on 10/16/2023)     nitrofurantoin  (MACRODANTIN ) 50 MG capsule Take 1 capsule (50 mg total) by mouth daily. (Patient not taking: Reported on 09/09/2023) 90 capsule 1   No facility-administered medications prior to visit.   Allergies  Allergen Reactions   Codeine Nausea And Vomiting and Other (See Comments)    Severe stomach cramps, also Other reaction(s): Unknown   Antihistamines, Diphenhydramine-Type Other (See Comments)    Causes hyperactivity   Diphenhydramine     Other reaction(s): Unknown   Erythromycin Hives, Rash and Other (See Comments)    Allergic due to dental work from 50 years ago. It was used in packing and resulted in rash/hives inside and outside of mouth.   Folic Acid  Other (See Comments)    Other reaction(s): tongue pain   Gabapentin     Urinary incontinence   Lyrica  [Pregabalin ]     Double vision/falls   Methotrexate Other (See Comments)    Other reaction(s): oral ulcers   Oxycodone      Hallucinations/crazy feelings   Rheumate [Fish Oil]     Other reaction(s): hypoglycemia   Statins Other (See Comments)    Joint pains Other reaction(s): Unknown   Sulfa Antibiotics Other (See Comments)    Reaction not recalled   Trulicity  [Dulaglutide ] Itching   Objective:   Today's Vitals   10/16/23 0831  BP: 122/70  Pulse: 78  Temp: (!) 97.1 F (36.2 C)  TempSrc: Temporal  SpO2: 96%  Weight: 148 lb 6.4 oz (67.3 kg)  Height: 5' 3 (1.6 m)   Body mass index is 26.29 kg/m.   General: Well developed, well nourished. No acute distress. Extremities: ROM is restricted in the left shoulder. No joint swelling. There is tenderness over the anterior   glenohumeral joint. No pain at the [facility] joint. Empty  can test is associated with some pain, but strength is   good. Right foot shows intact skin  and cap refill of ~ 2 secs. Psych: Alert and oriented. Normal mood and affect.  Health Maintenance Due  Topic Date Due   DTaP/Tdap/Td (1 - Tdap) Never done   Diabetic kidney evaluation - Urine ACR  05/13/2009   Zoster Vaccines- Shingrix (2 of 2) 03/29/2011   OPHTHALMOLOGY EXAM  10/01/2023     Assessment & Plan:   Problem List Items Addressed This Visit       Cardiovascular and Mediastinum   Essential hypertension   Blood pressure is at goal. Continue amlodipine  10 mg daily and metoprolol  tartrate 50 mg bid.      Peripheral artery disease   Ms. Longmire has severe PAD. We continue to focus on optimal diabetes, hypertension, and hyperlipidemia control.        Endocrine   Hypothyroidism (Chronic)   TSH at goal. Continue levothyroxine  125 mcg daily.      Type 2 diabetes mellitus with neurologic complication, with long-term current use of insulin  (HCC) - Primary   Last A1c has been at goal. Continue glipizide  10 mg, semaglutide  (Ozempic ) 0.5 mg weekly, and insulin  glargine 25 units bid.      Relevant Orders   Glucose, random   Hemoglobin A1c   Microalbumin / creatinine urine ratio     Genitourinary   Stage 3b chronic kidney disease (CKD) (HCC)   Stable. Continue focus on blood pressure and glucose control, adequate hydration, and avoidance of nephrotoxic medications. Avoiding SGLT2i due to recurrent UTIs.        Other   Chronic left shoulder pain   I suspect Ms. Laird has osteoarthritis of the shoulder joint, though on exam there may be a rotator cuff tendinitis component. I will refer her to orthopedics for an evaluation and to consider a steroid injection.      Relevant Orders   Ambulatory referral to Orthopedics   Hyperlipidemia   LDL cholesterol at goal. Continue Repatha  and Vascepa  1 gm, 2 caps bid.        Return in about 3 months (around 01/15/2024) for  Reassessment.   Garnette CHRISTELLA Simpler, MD

## 2023-10-16 NOTE — Assessment & Plan Note (Signed)
 LDL cholesterol at goal. Continue Repatha and Vascepa 1 gm, 2 caps bid.

## 2023-10-16 NOTE — Assessment & Plan Note (Signed)
 TSH at goal. Continue levothyroxine 125 mcg daily.

## 2023-10-16 NOTE — Assessment & Plan Note (Signed)
 Stable. Continue focus on blood pressure and glucose control, adequate hydration, and avoidance of nephrotoxic medications. Avoiding SGLT2i due to recurrent UTIs.

## 2023-10-16 NOTE — Assessment & Plan Note (Signed)
[name] has severe PAD. We continue to focus on optimal diabetes, hypertension, and hyperlipidemia control.

## 2023-10-16 NOTE — Assessment & Plan Note (Signed)
 I suspect [name] has osteoarthritis of the shoulder joint, though on exam there may be a rotator cuff tendinitis component. I will refer her to orthopedics for an evaluation and to consider a steroid injection.

## 2023-10-16 NOTE — Assessment & Plan Note (Signed)
 Last A1c has been at goal. Continue glipizide  10 mg, semaglutide  (Ozempic ) 0.5 mg weekly, and insulin  glargine 25 units bid.

## 2023-10-18 ENCOUNTER — Telehealth: Payer: Self-pay

## 2023-10-18 NOTE — Telephone Encounter (Signed)
 Copied from CRM [id number]. Topic: Clinical - Lab/Test Results >> Oct 17, 2023  4:42 PM [name] wrote: Reason for CRM: pt called to get lab results. I relayed message from pcp and she asked for a nurse to give her a callback to discuss the actual numbers of her A1c

## 2023-10-18 NOTE — Telephone Encounter (Signed)
 Patient notified and no further questions.  Dm/cma

## 2023-10-31 ENCOUNTER — Other Ambulatory Visit: Payer: Self-pay | Admitting: Family Medicine

## 2023-10-31 ENCOUNTER — Other Ambulatory Visit: Payer: Self-pay | Admitting: Cardiovascular Disease

## 2023-10-31 DIAGNOSIS — E1140 Type 2 diabetes mellitus with diabetic neuropathy, unspecified: Secondary | ICD-10-CM

## 2023-10-31 DIAGNOSIS — K219 Gastro-esophageal reflux disease without esophagitis: Secondary | ICD-10-CM

## 2023-11-05 LAB — HM DIABETES EYE EXAM

## 2023-11-06 ENCOUNTER — Encounter: Payer: Self-pay | Admitting: Family Medicine

## 2023-11-10 ENCOUNTER — Other Ambulatory Visit: Payer: Self-pay | Admitting: Family Medicine

## 2023-11-10 DIAGNOSIS — E1142 Type 2 diabetes mellitus with diabetic polyneuropathy: Secondary | ICD-10-CM

## 2023-12-03 ENCOUNTER — Encounter (HOSPITAL_COMMUNITY)

## 2023-12-03 ENCOUNTER — Ambulatory Visit

## 2023-12-05 ENCOUNTER — Ambulatory Visit (HOSPITAL_BASED_OUTPATIENT_CLINIC_OR_DEPARTMENT_OTHER)
Admission: RE | Admit: 2023-12-05 | Discharge: 2023-12-05 | Disposition: A | Discharge status: Home / Self Care | Source: Ambulatory Visit | Attending: Physician Assistant | Admitting: Physician Assistant

## 2023-12-05 ENCOUNTER — Ambulatory Visit: Admitting: Physician Assistant

## 2023-12-05 ENCOUNTER — Ambulatory Visit (HOSPITAL_COMMUNITY)
Admission: RE | Admit: 2023-12-05 | Discharge: 2023-12-05 | Disposition: A | Discharge status: Home / Self Care | Source: Ambulatory Visit | Attending: Physician Assistant | Admitting: Physician Assistant

## 2023-12-05 VITALS — BP 148/71 | HR 76 | Temp 97.7°F | Wt 147.2 lb

## 2023-12-05 DIAGNOSIS — I739 Peripheral vascular disease, unspecified: Secondary | ICD-10-CM | POA: Diagnosis present

## 2023-12-05 DIAGNOSIS — Z9889 Other specified postprocedural states: Secondary | ICD-10-CM | POA: Insufficient documentation

## 2023-12-05 DIAGNOSIS — T82856A Stenosis of peripheral vascular stent, initial encounter: Secondary | ICD-10-CM | POA: Diagnosis not present

## 2023-12-05 DIAGNOSIS — Z95820 Peripheral vascular angioplasty status with implants and grafts: Secondary | ICD-10-CM | POA: Diagnosis present

## 2023-12-05 DIAGNOSIS — T82858A Stenosis of vascular prosthetic devices, implants and grafts, initial encounter: Secondary | ICD-10-CM | POA: Insufficient documentation

## 2023-12-05 LAB — VAS US ABI WITH/WO TBI: Right ABI: 0.7

## 2023-12-05 NOTE — Progress Notes (Signed)
 Office Note     CC:  follow up Requesting Provider:  Thedora Garnette HERO, MD  HPI: [name] is a 83 y.o. (10/15/1940) female who presents for surveillance follow up of PAD. She has very extensive vascular surgery history as listed below:  1) right SFA angioplasty and stenting on 04/29/2019 by Dr. 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 Lanis 3) right SFA/popliteal laser atherectomy, right SFA angioplasty and stenting, right AT stenting on 03/07/2022 by Dr. Lanis 4) drug-coated balloon angioplasty of previous right lower extremity stents, balloon angioplasty of the right AT, and drug-eluting stent within the previous AT stent on 07/18/2022 by Dr.Robins  Today she reports overall doing well. She says she had developed some blood blisters in her right leg a couple months ago after she was at the beach. These have since resolved but she has some darkening in the area. She otherwise denies any pain on ambulation or rest.  No non healing wounds. 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, inv mammary, in situ,ER/PR +,HER2 -   Cancer (HCC)    Carcinoma of breast treated with adjuvant chemotherapy (HCC)    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HCC)    Claudication    Coronary artery disease    stent 2007   Depression    Diabetes mellitus    Difficulty sleeping    Diverticulosis 12/10/03   Elevated cholesterol    Generalized weakness    GERD (gastroesophageal reflux disease)    Heart murmur    Hepatitis    as an infant   HSV-1 (herpes simplex virus 1) infection    Hyperlipidemia    Hypertension    Hypothyroidism    Osteoarthritis    PAD (peripheral artery disease)    Personal history of chemotherapy    Personal history of radiation therapy    Pneumonia    2014  september and March 2022   Rheumatoid arthritis (HCC)    S/P aortic valve replacement with bioprosthetic valve 05/04/2020   Edwards Inspiris Resilia stented bovine pericardial tissue valve, size 23 mm   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   LIMA to D3, SVG to PDA, EVH via right thigh   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   Dr Renaye Sous   Spinal stenosis    Thyroid  disease     Past Surgical History:  Procedure Laterality Date   ABDOMINAL AORTOGRAM W/LOWER EXTREMITY Bilateral 04/29/2019   Procedure: ABDOMINAL AORTOGRAM W/LOWER EXTREMITY;  Surgeon: Gretta Lonni PARAS, MD;  Location: [facility] INVASIVE CV LAB;  Service: Cardiovascular;  Laterality: Bilateral;   ABDOMINAL AORTOGRAM W/LOWER EXTREMITY Left 07/01/2019   Procedure: ABDOMINAL AORTOGRAM W/ Left LOWER EXTREMITY Run[place];  Surgeon: Gretta Lonni PARAS, MD;  Location: [facility] INVASIVE CV LAB;  Service: Cardiovascular;  Laterality: Left;   ABDOMINAL AORTOGRAM W/LOWER EXTREMITY Left 03/03/2021   Procedure: ABDOMINAL AORTOGRAM W/LOWER EXTREMITY;  Surgeon: Magda Debby SAILOR, MD;  Location: [facility] INVASIVE CV LAB;  Service: Cardiovascular;  Laterality: Left;   ABDOMINAL AORTOGRAM W/LOWER EXTREMITY Right 06/12/2021   Procedure: ABDOMINAL AORTOGRAM W/LOWER EXTREMITY;  Surgeon: Sheree Penne Lonni, MD;  Location: [facility] INVASIVE CV LAB;  Service: Cardiovascular;  Laterality: Right;  LOWER LEG  ABDOMINAL AORTOGRAM W/LOWER EXTREMITY N/A 03/07/2022   Procedure: ABDOMINAL AORTOGRAM W/LOWER EXTREMITY;  Surgeon: Lanis Fonda BRAVO, MD;  Location: [facility] INVASIVE CV LAB;  Service: Cardiovascular;  Laterality: N/A;   ABDOMINAL AORTOGRAM W/LOWER EXTREMITY N/A 07/18/2022   Procedure: ABDOMINAL AORTOGRAM W/LOWER EXTREMITY;  Surgeon: Lanis Fonda BRAVO, MD;  Location: [facility] INVASIVE CV LAB;  Service: Cardiovascular;  Laterality: N/A;   AMPUTATION Left 03/06/2021   Procedure: LEFT ABOVE KNEE AMPUTATION;  Surgeon: Lanis Fonda BRAVO, MD;  Location: [facility] OR;  Service: Vascular;  Laterality: Left;    ANTERIOR AND POSTERIOR REPAIR N/A 04/05/2016   Procedure: ANTERIOR (CYSTOCELE) AND POSTERIOR REPAIR (RECTOCELE);  Surgeon: Truman Corona, MD;  Location: [facility] ORS;  Service: Gynecology;  Laterality: N/A;   AORTIC VALVE REPLACEMENT N/A 05/04/2020   Procedure: AORTIC VALVE REPLACEMENT (AVR) USING INSPIRIS RESILIA AORTIC VALVE;  Surgeon: Dusty Sudie DEL, MD;  Location: [facility] OR;  Service: Open Heart Surgery;  Laterality: N/A;   BREAST BIOPSY     BREAST LUMPECTOMY  1983   benign biopsy   BREAST LUMPECTOMY Right 12/29/2010   snbx, ER/PR +, Her2 -, 0/1 node pos.   CARPAL TUNNEL RELEASE Bilateral 9/12,8,12   CATARACT EXTRACTION Right 2024   CHOLECYSTECTOMY     COLONOSCOPY N/A 02/09/2013   Procedure: COLONOSCOPY;  Surgeon: Princella CHRISTELLA Nida, MD;  Location: [facility];  Service: Endoscopy;  Laterality: N/A;   COLONOSCOPY     CORONARY ANGIOPLASTY     CORONARY ARTERY BYPASS GRAFT N/A 05/04/2020   Procedure: CORONARY ARTERY BYPASS GRAFTING (CABG), ON PUMP, TIMES TWO, USING LEFT INTERNAL MAMMARY ARTERY AND RIGHT ENDOSCOPICALLY HARVESTED GREATER SAPHENOUS VEIN;  Surgeon: Dusty Sudie DEL, MD;  Location: [facility] OR;  Service: Open Heart Surgery;  Laterality: N/A;   DILATION AND CURETTAGE OF UTERUS     ESOPHAGOGASTRODUODENOSCOPY N/A 02/09/2013   Procedure: ESOPHAGOGASTRODUODENOSCOPY (EGD);  Surgeon: Princella CHRISTELLA Nida, MD;  Location: [facility];  Service: Endoscopy;  Laterality: N/A;   FOOT SPURS     HYSTEROSCOPY WITH D & C N/A 09/11/2012   Procedure: DILATATION AND CURETTAGE ;  Surgeon: Truman Corona, MD;  Location: [facility];  Service: Gynecology;  Laterality: N/A;   KNEE SURGERY  2007   LAPAROSCOPIC ASSISTED VAGINAL HYSTERECTOMY N/A 04/05/2016   Procedure: LAPAROSCOPIC ASSISTED VAGINAL HYSTERECTOMY possible BSO;  Surgeon: Truman Corona, MD;  Location: [facility];  Service: Gynecology;  Laterality: N/A;   LUMBAR LAMINECTOMY/DECOMPRESSION MICRODISCECTOMY N/A 10/20/2014   Procedure: MICRO LUMBER DECOMPRESSION L3-4  L4-5;  Surgeon: Reyes Billing, MD;  Location: [facility];  Service: Orthopedics;  Laterality: N/A;   LUMBAR LAMINECTOMY/DECOMPRESSION MICRODISCECTOMY Bilateral 10/13/2015   Procedure: MICRO LUMBAR DECOMPRESSION L5 - S1 AND REDO DECOMPRESSION L4 - L5 AND REMOVAL OF FACET CYST L4 - L5 2 LEVELS;  Surgeon: Reyes Billing, MD;  Location: [facility];  Service: Orthopedics;  Laterality: Bilateral;   PERIPHERAL VASCULAR ATHERECTOMY Right 06/12/2021   Procedure: PERIPHERAL VASCULAR ATHERECTOMY;  Surgeon: Sheree Penne Bruckner, MD;  Location: [facility] INVASIVE CV LAB;  Service: Cardiovascular;  Laterality: Right;  SFA/POP/AT   PERIPHERAL VASCULAR INTERVENTION Right 04/29/2019   Procedure: PERIPHERAL VASCULAR INTERVENTION;  Surgeon: Gretta Bruckner PARAS, MD;  Location: [facility] INVASIVE CV LAB;  Service: Cardiovascular;  Laterality: Right;   PERIPHERAL VASCULAR INTERVENTION Right 06/12/2021   Procedure: PERIPHERAL VASCULAR INTERVENTION;  Surgeon: Sheree Penne Bruckner, MD;  Location: [facility] INVASIVE CV LAB;  Service: Cardiovascular;  Laterality: Right;  SFP/POPLITEAL/AT   PERIPHERAL VASCULAR INTERVENTION  03/07/2022   Procedure: PERIPHERAL VASCULAR INTERVENTION;  Surgeon: Lanis,  Fonda BRAVO, MD;  Location: [facility] INVASIVE CV LAB;  Service: Cardiovascular;;   PERIPHERAL VASCULAR INTERVENTION Right 07/18/2022   Procedure: PERIPHERAL VASCULAR INTERVENTION;  Surgeon: Lanis Fonda BRAVO, MD;  Location: [facility] INVASIVE CV LAB;  Service: Cardiovascular;  Laterality: Right;   PORT-A-CATH REMOVAL  04/17/2011   Procedure: REMOVAL PORT-A-CATH;  Surgeon: Donnice Bury, MD;  Location: [facility];  Service: General;  Laterality: Left;   PORTACATH PLACEMENT  02/07/2011   Procedure: INSERTION PORT-A-CATH;  Surgeon: Donnice Bury, MD;  Location: [facility];  Service: General;  Laterality: N/A;   RIGHT/LEFT HEART CATH AND CORONARY ANGIOGRAPHY N/A 03/17/2020   Procedure: RIGHT/LEFT HEART CATH AND CORONARY ANGIOGRAPHY;  Surgeon: Verlin Lonni BIRCH, MD;  Location: [facility] INVASIVE CV LAB;  Service: Cardiovascular;  Laterality: N/A;   TEE WITHOUT CARDIOVERSION N/A 05/04/2020   Procedure: TRANSESOPHAGEAL ECHOCARDIOGRAM (TEE);  Surgeon: Dusty Sudie DEL, MD;  Location: [facility] OR;  Service: Open Heart Surgery;  Laterality: N/A;   UPPER GASTROINTESTINAL ENDOSCOPY      Social History   Socioeconomic History   Marital status: Married    Spouse name: Not on file   Number of children: 3   Years of education: Not on file   Highest education level: Not on file  Occupational History   Occupation: retired-bookkeeping  Tobacco Use   Smoking status: Former    Current packs/day: 0.00    Average packs/day: 2.0 packs/day for 40.0 years (80.0 ttl pk-yrs)    Types: Cigarettes    Start date: 12/19/1957    Quit date: 12/19/1997    Years since quitting: 25.9   Smokeless tobacco: Never  Vaping Use   Vaping status: Never Used  Substance and Sexual Activity   Alcohol use: Yes    Alcohol/week: 1.0 standard drink of alcohol    Types: 1 Glasses of wine per week    Comment: occasiona - maybe once a month   Drug use: No   Sexual activity: Not Currently    Comment: menarch age 50, P31, HRT X 10 YRS, MENOPAUSE MID 40'S  Other Topics Concern   Not on file  Social History Narrative   One daughter died from suicide at age 3.   Social Drivers of Corporate Investment Banker Strain: Low Risk  (09/09/2023)   Overall Financial Resource Strain (CARDIA)    Difficulty of Paying Living Expenses: Not hard at all  Food Insecurity: No Food Insecurity (09/09/2023)   Hunger Vital Sign    Worried About Running Out of Food in the Last Year: Never true    Ran Out of Food in the Last Year: Never true  Transportation Needs: No Transportation Needs (09/09/2023)   PRAPARE - Administrator, Civil Service (Medical): No    Lack of Transportation (Non-Medical): No  Physical Activity: Inactive (09/09/2023)   Exercise Vital Sign    Days of Exercise per  Week: 0 days    Minutes of Exercise per Session: 0 min  Stress: No Stress Concern Present (09/09/2023)   [employer] of Occupational Health - Occupational Stress Questionnaire    Feeling of Stress: Not at all  Social Connections: Moderately Isolated (09/09/2023)   Social Connection and Isolation Panel    Frequency of Communication with Friends and Family: More than three times a week    Frequency of Social Gatherings with Friends and Family: More than three times a week    Attends Religious Services: Never    Database Administrator or Organizations: No  Attends Banker Meetings: Never    Marital Status: Married  Catering Manager Violence: Not At Risk (09/09/2023)   Humiliation, Afraid, Rape, and Kick questionnaire    Fear of Current or Ex-Partner: No    Emotionally Abused: No    Physically Abused: No    Sexually Abused: No    Family History  Problem Relation Age of Onset   Kidney disease Mother    Heart disease Mother    Throat cancer Father    Alcoholism Father    Heart attack Father    Lymphoma Brother 60   Heart attack Brother    Diabetes Brother    Hypertension Brother    Cancer Son    Colon cancer Neg Hx    Stroke Neg Hx    Esophageal cancer Neg Hx    Rectal cancer Neg Hx    Stomach cancer Neg Hx     Current Outpatient Medications  Medication Sig Dispense Refill   Accu-Chek FastClix Lancets MISC USE TO CHECK BLOOD SUGARS 3 TIMES DAILY 306 each 3   amLODipine  (NORVASC ) 10 MG tablet TAKE 1 TABLET(10 MG) BY MOUTH DAILY 90 tablet 2   aspirin  EC 81 MG tablet Take 81 mg by mouth in the morning.     blood glucose meter kit and supplies Dispense based on patient and insurance preference. Use up to four times daily as directed. (FOR ICD-10 E10.9, E11.9). 1 each 0   Cholecalciferol 125 MCG (5000 UT) capsule Take 5,000 Units by mouth daily.     clobetasol  ointment (TEMOVATE ) 0.05 % APPLY A THIN LAYER TO THE AFFECTED AREA(S) BY TOPICAL ROUTE 2 TIMES PER DAY      clopidogrel  (PLAVIX ) 75 MG tablet TAKE 1 TABLET(75 MG) BY MOUTH DAILY 30 tablet 11   glipiZIDE  (GLUCOTROL  XL) 10 MG 24 hr tablet TAKE 1 TABLET BY MOUTH DAILY  WITH BREAKFAST 90 tablet 3   glucose blood (ACCU-CHEK GUIDE) test strip TEST ONCE DAILY 100 each 5   Golimumab (SIMPONI ARIA IV) Inject into the vein every 2 (two) months.     Insulin  Pen Needle (CAREFINE PEN NEEDLES) 32G X 6 MM MISC 1 application by Does not apply route 2 (two) times daily. 120 each 1   LANTUS  SOLOSTAR 100 UNIT/ML Solostar Pen ADMINISTER 25 UNITS UNDER THE SKIN TWICE DAILY 15 mL 3   levothyroxine  (SYNTHROID ) 125 MCG tablet TAKE 1 TABLET BY MOUTH DAILY 90 tablet 3   metoprolol  tartrate (LOPRESSOR ) 50 MG tablet TAKE 1 TABLET BY MOUTH TWICE  DAILY 180 tablet 1   pantoprazole  (PROTONIX ) 40 MG tablet TAKE 1 TABLET BY MOUTH DAILY 90 tablet 3   PARoxetine  (PAXIL ) 20 MG tablet TAKE 1 TABLET BY MOUTH IN THE  MORNING 90 tablet 3   REPATHA  SURECLICK 140 MG/ML SOAJ INJECT 1 PEN INTO THE SKIN EVERY 14 DAYS 2 mL 11   Semaglutide , 1 MG/DOSE, 4 MG/3ML SOPN Inject 1 mg as directed once a week. 9 mL 3   valACYclovir  (VALTREX ) 1000 MG tablet Take 1 tablet (1,000 mg total) by mouth daily as needed (Fever Blisters). 180 tablet 0   No current facility-administered medications for this visit.    Allergies  Allergen Reactions   Codeine Nausea And Vomiting and Other (See Comments)    Severe stomach cramps, also Other reaction(s): Unknown   Antihistamines, Diphenhydramine-Type Other (See Comments)    Causes hyperactivity   Diphenhydramine     Other reaction(s): Unknown   Erythromycin Hives, Rash and Other (See  Comments)    Allergic due to dental work from 50 years ago. It was used in packing and resulted in rash/hives inside and outside of mouth.   Folic Acid  Other (See Comments)    Other reaction(s): tongue pain   Gabapentin     Urinary incontinence   Lyrica  [Pregabalin ]     Double vision/falls   Methotrexate Other (See Comments)     Other reaction(s): oral ulcers   Oxycodone      Hallucinations/crazy feelings   Rheumate [Fish Oil]     Other reaction(s): hypoglycemia   Statins Other (See Comments)    Joint pains Other reaction(s): Unknown   Sulfa Antibiotics Other (See Comments)    Reaction not recalled   Trulicity  [Dulaglutide ] Itching     REVIEW OF SYSTEMS:  Negative unless noted in HPI [X]  denotes positive finding, [ ]  denotes negative finding Cardiac  Comments:  Chest pain or chest pressure:    Shortness of breath upon exertion:    Short of breath when lying flat:    Irregular heart rhythm:        Vascular    Pain in calf, thigh, or hip brought on by ambulation:    Pain in feet at night that wakes you up from your sleep:     Blood clot in your veins:    Leg swelling:         Pulmonary    Oxygen at home:    Productive cough:     Wheezing:         Neurologic    Sudden weakness in arms or legs:     Sudden numbness in arms or legs:     Sudden onset of difficulty speaking or slurred speech:    Temporary loss of vision in one eye:     Problems with dizziness:         Gastrointestinal    Blood in stool:     Vomited blood:         Genitourinary    Burning when urinating:     Blood in urine:        Psychiatric    Major depression:         Hematologic    Bleeding problems:    Problems with blood clotting too easily:        Skin    Rashes or ulcers:        Constitutional    Fever or chills:      PHYSICAL EXAMINATION:  Vitals:   12/05/23 1259  BP: (!) 148/71  Pulse: 76  Temp: 97.7 F (36.5 C)  TempSrc: Temporal  Weight: 147 lb 3.2 oz (66.8 kg)    General:  WDWN in NAD; vital signs documented above Gait: uses rollator and left AKA prosthesis  HENT: WNL, normocephalic Pulmonary: normal non-labored breathing Cardiac: regular HR Abdomen: soft Vascular Exam/Pulses: 2+ right femoral pulse, brisk DP/PT/ Pero signals Extremities: without ischemic changes, without Gangrene ,  without cellulitis; without open wounds; small area on medial distal right leg that looks like actinic purpura Musculoskeletal: no muscle wasting or atrophy  Neurologic: A&O X 3 Psychiatric:  The pt has Normal affect.   Non-Invasive Vascular Imaging:   +-------+-----------+-----------+------------+------------+  ABI/TBIToday's ABIToday's TBIPrevious ABIPrevious TBI  +-------+-----------+-----------+------------+------------+  Right 0.70       0.51       0.67        not obtained  +-------+-----------+-----------+------------+------------+  Left  AKA        AKA  AKA         AKA           +-------+-----------+-----------+------------+------------+   VAS US  Lower extremity arterial duplex: Summary:  Right: Patent proximal to distal SFA stent without significant stenosis.   Patent popliteal stent with velocities suggestive of a 1-49% stenosis.   Proximal anterior tibial artery with history of stent which is difficult to visualize, demonstrates velocities of 221 cm/s suggesting a 50-99% stenosis.    ASSESSMENT/PLAN:: 83 y.o. female presents for surveillance follow up of PAD. She has numerous vascular interventions. She currently is without any claudication, rest pain or tissue loss. Her ABI today is improved from prior study. Her duplex shows patent SFA/ Popliteal stents. She has an elevated velocity in her right AT stent but this is overall stable. No indication for intervention at this time.  - encourage mobilization - Continue Aspirin , Plavix , Repatha  - She will follow up again in 6 months with repeat AKA and RLE arterial duplex   Teretha Damme, PA-C [facility] [phone]  Clinic MD:  Lanis

## 2023-12-06 ENCOUNTER — Other Ambulatory Visit: Payer: Self-pay | Admitting: *Deleted

## 2023-12-06 DIAGNOSIS — Z95820 Peripheral vascular angioplasty status with implants and grafts: Secondary | ICD-10-CM

## 2023-12-06 DIAGNOSIS — I739 Peripheral vascular disease, unspecified: Secondary | ICD-10-CM

## 2023-12-12 ENCOUNTER — Emergency Department (HOSPITAL_COMMUNITY)

## 2023-12-12 ENCOUNTER — Other Ambulatory Visit: Payer: Self-pay | Admitting: Family Medicine

## 2023-12-12 ENCOUNTER — Emergency Department (HOSPITAL_COMMUNITY)
Admission: EM | Admit: 2023-12-12 | Discharge: 2023-12-12 | Disposition: A | Discharge status: Home / Self Care | Attending: Emergency Medicine | Admitting: Emergency Medicine

## 2023-12-12 ENCOUNTER — Other Ambulatory Visit: Payer: Self-pay

## 2023-12-12 DIAGNOSIS — W1830XA Fall on same level, unspecified, initial encounter: Secondary | ICD-10-CM

## 2023-12-12 DIAGNOSIS — Y92511 Restaurant or cafe as the place of occurrence of the external cause: Secondary | ICD-10-CM | POA: Insufficient documentation

## 2023-12-12 DIAGNOSIS — E1142 Type 2 diabetes mellitus with diabetic polyneuropathy: Secondary | ICD-10-CM | POA: Insufficient documentation

## 2023-12-12 DIAGNOSIS — S0990XA Unspecified injury of head, initial encounter: Secondary | ICD-10-CM | POA: Diagnosis present

## 2023-12-12 DIAGNOSIS — I2510 Atherosclerotic heart disease of native coronary artery without angina pectoris: Secondary | ICD-10-CM | POA: Insufficient documentation

## 2023-12-12 DIAGNOSIS — Z955 Presence of coronary angioplasty implant and graft: Secondary | ICD-10-CM | POA: Insufficient documentation

## 2023-12-12 DIAGNOSIS — Z7984 Long term (current) use of oral hypoglycemic drugs: Secondary | ICD-10-CM | POA: Insufficient documentation

## 2023-12-12 DIAGNOSIS — Z7982 Long term (current) use of aspirin: Secondary | ICD-10-CM | POA: Diagnosis not present

## 2023-12-12 DIAGNOSIS — S0181XA Laceration without foreign body of other part of head, initial encounter: Secondary | ICD-10-CM | POA: Diagnosis not present

## 2023-12-12 DIAGNOSIS — Z794 Long term (current) use of insulin: Secondary | ICD-10-CM | POA: Insufficient documentation

## 2023-12-12 DIAGNOSIS — W01198A Fall on same level from slipping, tripping and stumbling with subsequent striking against other object, initial encounter: Secondary | ICD-10-CM | POA: Insufficient documentation

## 2023-12-12 LAB — URINALYSIS, MICROSCOPIC (REFLEX)

## 2023-12-12 LAB — URINALYSIS, ROUTINE W REFLEX MICROSCOPIC
Bilirubin Urine: NEGATIVE
Glucose, UA: NEGATIVE mg/dL
Ketones, ur: NEGATIVE mg/dL
Nitrite: NEGATIVE
Protein, ur: 100 mg/dL — AB
Specific Gravity, Urine: 1.025 (ref 1.005–1.030)
pH: 6 (ref 5.0–8.0)

## 2023-12-12 LAB — BASIC METABOLIC PANEL WITH GFR
Anion gap: 13 (ref 5–15)
BUN: 26 mg/dL — ABNORMAL HIGH (ref 8–23)
CO2: 21 mmol/L — ABNORMAL LOW (ref 22–32)
Calcium: 9.2 mg/dL (ref 8.9–10.3)
Chloride: 102 mmol/L (ref 98–111)
Creatinine, Ser: 1.46 mg/dL — ABNORMAL HIGH (ref 0.44–1.00)
GFR, Estimated: 36 mL/min — ABNORMAL LOW (ref >60)
Glucose, Bld: 162 mg/dL — ABNORMAL HIGH (ref 70–99)
Potassium: 4.5 mmol/L (ref 3.5–5.1)
Sodium: 136 mmol/L (ref 135–145)

## 2023-12-12 LAB — CBC WITH DIFFERENTIAL/PLATELET
Abs Immature Granulocytes: 0.02 K/uL (ref 0.00–0.07)
Basophils Absolute: 0.1 K/uL (ref 0.0–0.1)
Basophils Relative: 1 %
Eosinophils Absolute: 0.3 K/uL (ref 0.0–0.5)
Eosinophils Relative: 3 %
HCT: 40.5 % (ref 36.0–46.0)
Hemoglobin: 13.8 g/dL (ref 12.0–15.0)
Immature Granulocytes: 0 %
Lymphocytes Relative: 30 %
Lymphs Abs: 3.1 K/uL (ref 0.7–4.0)
MCH: 29.8 pg (ref 26.0–34.0)
MCHC: 34.1 g/dL (ref 30.0–36.0)
MCV: 87.5 fL (ref 80.0–100.0)
Monocytes Absolute: 1.1 K/uL — ABNORMAL HIGH (ref 0.1–1.0)
Monocytes Relative: 10 %
Neutro Abs: 5.8 K/uL (ref 1.7–7.7)
Neutrophils Relative %: 56 %
Platelets: 259 K/uL (ref 150–400)
RBC: 4.63 MIL/uL (ref 3.87–5.11)
RDW: 13.6 % (ref 11.5–15.5)
WBC: 10.3 K/uL (ref 4.0–10.5)
nRBC: 0 % (ref 0.0–0.2)

## 2023-12-12 MED ORDER — TETANUS-DIPHTH-ACELL PERTUSSIS 5-2-15.5 LF-MCG/0.5 IM SUSP
0.5000 mL | Freq: Once | INTRAMUSCULAR | Status: DC
  Filled 2023-12-12: qty 0.5

## 2023-12-12 MED ORDER — BACITRACIN ZINC 500 UNIT/GM EX OINT
TOPICAL_OINTMENT | Freq: Once | CUTANEOUS | Status: AC
  Administered 2023-12-12: 1 via TOPICAL
  Filled 2023-12-12: qty 0.9

## 2023-12-12 MED ORDER — LIDOCAINE-EPINEPHRINE (PF) 2 %-1:200000 IJ SOLN
10.0000 mL | Freq: Once | INTRAMUSCULAR | Status: AC
  Administered 2023-12-12: 10 mL via INTRADERMAL
  Filled 2023-12-12: qty 20

## 2023-12-12 MED ORDER — ACETAMINOPHEN 325 MG PO TABS: 650.0000 mg | ORAL_TABLET | Freq: Once | ORAL | Status: DC

## 2023-12-12 NOTE — Progress Notes (Signed)
 Orthopedic Tech Progress Note Patient Details:  [name] May 15, 1940 [id number]  Patient ID: [name], female   DOB: March 23, 1940, 83 y.o.   MRN: [id number] Responded to Level 2 Trauma no ortho tech needed at the moment. [name] [name] 12/12/2023, 1:26 PM

## 2023-12-12 NOTE — Discharge Instructions (Addendum)
 Your CT of the head did not show any skull fractures or brain bleeds.  The x-rays taken of your chest and pelvis did not show any injuries.  You had 11 non-absorbable sutures placed today. You must get your sutures rechecked for possible removal in 5 days. We recommend visiting your PCP or an urgent care for suture removal. However, you may also return back to the ER if you are unable to be seen by your PCP or at urgent care.   You may gently clean the area around your laceration as needed with water. Place antibiotic ointment such as bacitracin  or neosporin over your laceration after cleaning the area.  Keep the laceration covered with sterile gauze as shown here if you are doing an activity in which it may get dirty. You may pick these supplies up at any drugstore.  Do not submerge your laceration in water (no baths, swimming) until it is fully healed. You may shower.   You may take up to 650mg  of tylenol  every 6 hours as needed for pain. Do not take more then 4g per day.   Return to the ER should you develop fever, chills, pus drainage from your wound, redness around your wound, any other new or concerning symptoms

## 2023-12-12 NOTE — ED Provider Notes (Signed)
 Physical Exam  BP (!) 160/72 (BP Location: Left Arm)   Pulse 95   Temp 97.8 F (36.6 C) (Oral)   Resp 20   Ht 5' 3 (1.6 m)   Wt 66.7 kg   SpO2 100%   BMI 26.04 kg/m   Physical Exam Vitals and nursing note reviewed.  Constitutional:      Appearance: Normal appearance.     Comments: At baseline mentation per family at bedside  HENT:     Head: Atraumatic.  Pulmonary:     Effort: Pulmonary effort is normal.  Musculoskeletal:     Cervical back: Normal range of motion.     Comments: Moves all extremities without difficulty Ambulates without difficulty  No cervical, thoracic, or lumbar spinal TTP  Skin:    Comments: 8cm Laceration to the forehead with minimal bleeding upon arrival.  No visualized foreign body.  Neurological:     General: No focal deficit present.     Mental Status: She is alert and oriented to person, place, and time.  Psychiatric:        Mood and Affect: Mood normal.        Behavior: Behavior normal.      Procedures  .Laceration Repair  Date/Time: 12/12/2023 4:50 PM  Performed by: Veta Palma, PA-C Authorized by: Veta Palma, PA-C   Consent:    Consent obtained:  Verbal   Consent given by:  Patient   Risks, benefits, and alternatives were discussed: yes     Risks discussed:  Infection, pain, poor cosmetic result and poor wound healing   Alternatives discussed:  No treatment Universal protocol:    Procedure explained and questions answered to patient or proxy's satisfaction: yes     Imaging studies available: yes     Patient identity confirmed:  Verbally with patient Anesthesia:    Anesthesia method:  Local infiltration   Local anesthetic:  Lidocaine  1% WITH epi Laceration details:    Location:  Face   Face location:  Forehead   Length (cm):  8   Depth (mm):  3 Exploration:    Imaging obtained comment:  CT head   Imaging outcome: foreign body not noted     Wound exploration: wound explored through full range of motion and  entire depth of wound visualized   Treatment:    Area cleansed with:  Chlorhexidine    Amount of cleaning:  Standard   Irrigation solution:  Sterile saline   Irrigation volume:    Irrigation method:  Syringe Skin repair:    Repair method:  Sutures   Suture size:  5-0   Suture material:  Prolene   Suture technique:  Simple interrupted   Number of sutures:  11 Repair type:    Repair type:  Simple Post-procedure details:    Dressing:  Antibiotic ointment and non-adherent dressing   Procedure completion:  Tolerated well, no immediate complications Comments:     Laceration repair performed by PA student Nadia Jaffao. I was at bedside and immediately available    ED Course / MDM    Medical Decision Making Amount and/or Complexity of Data Reviewed Labs: ordered. Radiology: ordered. ECG/medicine tests: ordered.  Risk OTC drugs. Prescription drug management.   Patient received at signout.  In short, patient presents with concern for a mechanical fall that occurred earlier today.  She leaned up against a cart which moved, and caused her to fall and hit her head.  She denied any chest pain, shortness of breath, dizziness, or syncope.  Low concern for any other etiology for her fall at this time.  She has good story for mechanical fall. Patient does take Plavix .  CT head was was obtained by previous team which did not show any intracranial hemorrhage, skull fracture, or other acute injury.  CT cervical spine also without acute injury.  Chest x-ray without evidence of rib fracture.  Pelvic x-ray unremarkable.  Labs Ordered: I Ordered, and personally interpreted labs.  The pertinent results include:   BMP and CBC at baseline  Medications Given: Tylenol   Imaging was reviewed which revealed no retained foreign body or other acute injury. Patient's wound was irrigated well with sterile saline and cleaned with chlorhexidine  swabs. Anesthesia achieved with lidocaine  with epinephrine .  Patient's laceration was repaired with 11 simple interrupted 5-0 Prolene sutures. Patient tolerated this well without any immediate complications. Suture repair occurred less than 24 hours after initial injury. Patient remains neurovascularly intact after suture placement.  Patient reports Tdap was updated within the last month at an outpatient pharmacy.  She declines tetanus shot today.  Low concern for any other fractures, dislocations, or other injuries given patient denies any pain in the upper or lower extremities and is able to move upper and lower extremities left difficulty.  Patient stable and appropriate for discharge home.     Impression: Forehead Laceration  Disposition:  Patient discharged home with instructions to keep laceration site clean with gentle soap and water. Apply neosporin or bacitracin  over the area as shown here and keep covered with sterile dressing. Follow up with PCP or return to ER in 5 days for suture/staple removal. Tylenol  as needed for pain.  Return precautions given and patient verbalized understanding.  This chart was dictated using voice recognition software, Dragon. Despite the best efforts of this provider to proofread and correct errors, errors may still occur which can change documentation meaning.           Veta Palma, PA-C 12/12/23 1654    Doretha Folks, MD 12/12/23 2355

## 2023-12-12 NOTE — ED Notes (Signed)
 Pt leaving with husband wheeled to front of lobby to await pick up. Pt is in no new distress at this time. Paper work reviewed. Pain denied at this time tylenol  refused.

## 2023-12-12 NOTE — ED Provider Notes (Signed)
 [place] EMERGENCY DEPARTMENT AT [facility] Provider Note   CSN: [id number] Arrival date & time: 12/12/23  1314     Patient presents with: No chief complaint on file.   [name] is a 83 y.o. female.   The history is provided by the patient, the EMS personnel and medical records. No language interpreter was used.     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  Patient was leaving a restaurant after having lunch this afternoon.  She was standing with her friend and was leaning against a cart that shifted and she lost balance falling to the ground striking her forehead against the ground.  She denies any loss of consciousness.  She is currently on Plavix  and therefore patient was brought in as a level 2 trauma.  She endorsed mild headache only but denies any severe neck pain, chest pain, trouble breathing, abdominal pain, back pain, or pain to her extremities.  She denies any precipitating symptoms prior to the fall.  She denies any confusion or nausea or vomiting.  Prior to Admission medications   Medication Sig Start Date End Date Taking? Authorizing Provider  Accu-Chek FastClix Lancets MISC USE TO CHECK BLOOD SUGARS 3 TIMES DAILY 11/03/19   Cirigliano, Mary K, DO  amLODipine  (NORVASC ) 10 MG tablet TAKE 1 TABLET(10 MG) BY MOUTH DAILY 03/21/23   Nishan, Peter C, MD  aspirin  EC 81 MG tablet Take 81 mg by mouth in the morning.    [provider]  blood glucose meter kit and supplies Dispense based on patient and insurance preference. Use up to four times daily as directed. (FOR ICD-10 E10.9, E11.9). 11/02/20   Thedora Garnette HERO, MD  Cholecalciferol 125 MCG (5000 UT) capsule Take 5,000 Units by mouth daily.    [provider]  clobetasol  ointment  (TEMOVATE ) 0.05 % APPLY A THIN LAYER TO THE AFFECTED AREA(S) BY TOPICAL ROUTE 2 TIMES PER DAY 07/24/22   [provider]  clopidogrel  (PLAVIX ) 75 MG tablet TAKE 1 TABLET(75 MG) BY MOUTH DAILY 07/15/23   Robins, Joshua E, MD  glipiZIDE  (GLUCOTROL  XL) 10 MG 24 hr tablet TAKE 1 TABLET BY MOUTH DAILY  WITH BREAKFAST 10/31/23   Thedora Garnette HERO, MD  glucose blood (ACCU-CHEK GUIDE) test strip TEST ONCE DAILY 11/04/20   Thedora Garnette HERO, MD  Golimumab [facility] ARIA IV) Inject into the vein every 2 (two) months.    [provider]  Insulin  Pen Needle (CAREFINE PEN NEEDLES) 32G X 6 MM MISC 1 application by Does not apply route 2 (two) times daily. 03/17/21   Love, Sharlet RAMAN, PA-C  LANTUS  SOLOSTAR 100 UNIT/ML Solostar Pen ADMINISTER 25 UNITS UNDER THE SKIN TWICE DAILY 11/11/23   Thedora Garnette HERO, MD  levothyroxine  (SYNTHROID ) 125 MCG tablet TAKE 1 TABLET BY MOUTH DAILY 12/12/23   Thedora Garnette HERO, MD  metoprolol  tartrate (LOPRESSOR ) 50 MG tablet TAKE 1 TABLET BY MOUTH TWICE  DAILY 10/31/23   Nishan, Peter C, MD  pantoprazole  (PROTONIX ) 40 MG tablet TAKE 1 TABLET BY MOUTH DAILY 10/31/23   Thedora Garnette HERO, MD  PARoxetine  (PAXIL ) 20 MG tablet TAKE 1 TABLET BY MOUTH IN THE  MORNING 03/08/23   Thedora Garnette HERO, MD  REPATHA  SURECLICK 140 MG/ML SOAJ INJECT 1 PEN INTO THE SKIN EVERY 14 DAYS 01/30/23  Delford Maude BROCKS, MD  Semaglutide , 1 MG/DOSE, 4 MG/3ML SOPN Inject 1 mg as directed once a week. 10/16/23   Thedora Garnette HERO, MD  valACYclovir  (VALTREX ) 1000 MG tablet Take 1 tablet (1,000 mg total) by mouth daily as needed (Fever Blisters). 05/20/20   [place] POUR, DO    Allergies: Codeine; Antihistamines, diphenhydramine-type; Diphenhydramine; Erythromycin; Folic acid ; Gabapentin; Lyrica  [pregabalin ]; Methotrexate; Oxycodone ; Rheumate [fish oil]; Statins; Sulfa antibiotics; and Trulicity  [dulaglutide ]    Review of Systems  All other systems reviewed and are negative.   Updated Vital Signs BP (!) 160/72 (BP  Location: Left Arm)   Pulse 95   Temp 97.8 F (36.6 C) (Oral)   Resp 20   Ht 5' 3 (1.6 m)   Wt 66.7 kg   SpO2 100%   BMI 26.04 kg/m   Physical Exam Vitals and nursing note reviewed.  Constitutional:      General: She is not in acute distress.    Appearance: She is well-developed.  HENT:     Head: Normocephalic.     Comments: Forehead: 3 cm laceration noted to mid forehead not actively bleeding. Eyes:     Extraocular Movements: Extraocular movements intact.     Conjunctiva/sclera: Conjunctivae normal.     Pupils: Pupils are equal, round, and reactive to light.  Cardiovascular:     Rate and Rhythm: Normal rate and regular rhythm.     Pulses: Normal pulses.     Heart sounds: Normal heart sounds.  Pulmonary:     Effort: Pulmonary effort is normal.     Breath sounds: No wheezing, rhonchi or rales.  Chest:     Chest wall: No tenderness.  Abdominal:     Palpations: Abdomen is soft.     Tenderness: There is no abdominal tenderness.  Musculoskeletal:     Cervical back: Normal range of motion and neck supple.     Comments: Left AKA with prosthesis in place  No midline spine tenderness  Skin:    Findings: No rash.  Neurological:     Mental Status: She is alert and oriented to person, place, and time.  Psychiatric:        Mood and Affect: Mood normal.     (all labs ordered are listed, but only abnormal results are displayed) Labs Reviewed  URINALYSIS, ROUTINE W REFLEX MICROSCOPIC - Abnormal; Notable for the following components:      Result Value   APPearance HAZY (*)    Hgb urine dipstick TRACE (*)    Protein, ur 100 (*)    Leukocytes,Ua SMALL (*)    All other components within normal limits  URINALYSIS, MICROSCOPIC (REFLEX) - Abnormal; Notable for the following components:   Bacteria, UA RARE (*)    All other components within normal limits  BASIC METABOLIC PANEL WITH GFR  CBC WITH DIFFERENTIAL/PLATELET    EKG: None  Radiology: CT HEAD WO CONTRAST Result  Date: 12/12/2023 EXAM: CT HEAD WITHOUT CONTRAST 12/12/2023 02:21:00 PM TECHNIQUE: CT of the head was performed without the administration of intravenous contrast. Automated exposure control, iterative reconstruction, and/or weight based adjustment of the mA/kV was utilized to reduce the radiation dose to as low as reasonably achievable. COMPARISON: 02/28/2021 CLINICAL HISTORY: Head trauma, moderate-severe. FINDINGS: BRAIN AND VENTRICLES: No acute hemorrhage. No evidence of acute infarct. Mild chronic small vessel ischemic change. No hydrocephalus. No extra-axial collection. No mass effect or midline shift. ORBITS: No acute abnormality. SINUSES: No acute abnormality. SOFT TISSUES AND SKULL: Left frontal scalp hematoma. Atherosclerotic  calcifications in skull base vessels. No skull fracture. IMPRESSION: 1. Left frontal scalp hematoma. 2. No acute intracranial abnormality. Electronically signed by: Franky Stanford MD 12/12/2023 02:32 PM EST RP Workstation: [id number]   CT CERVICAL SPINE WO CONTRAST Result Date: 12/12/2023 CLINICAL DATA:  Polytrauma, blunt Status post fall. EXAM: CT CERVICAL SPINE WITHOUT CONTRAST TECHNIQUE: Multidetector CT imaging of the cervical spine was performed without intravenous contrast. Multiplanar CT image reconstructions were also generated. RADIATION DOSE REDUCTION: This exam was performed according to the departmental dose-optimization program which includes automated exposure control, adjustment of the mA and/or kV according to patient size and/or use of iterative reconstruction technique. COMPARISON:  None Available. FINDINGS: Alignment: Physiologic. Skull base and vertebrae: No evidence of acute fracture or traumatic subluxation. Soft tissues and spinal canal: No prevertebral fluid or swelling. No visible canal hematoma. Disc levels: Multilevel cervical spondylosis with relatively preserved disc heights. There is multilevel facet arthropathy, moderately advanced at C2-3. There is  interfacetal ankylosis bilaterally at C3-4 and C4-5. No large disc herniation or high-grade spinal stenosis demonstrated. Upper chest: Emphysematous changes and mild scarring at both lung apices. Other: Bilateral carotid atherosclerosis. Ossification of the ligamentum nuchae. IMPRESSION: 1. No evidence of acute cervical spine fracture, traumatic subluxation or static signs of instability. 2. Multilevel cervical spondylosis as described. Electronically Signed   By: Elsie Perone M.D.   On: 12/12/2023 14:27   DG Chest Port 1 View Result Date: 12/12/2023 CLINICAL DATA:  Fall. EXAM: PORTABLE CHEST 1 VIEW COMPARISON:  02/28/2021. FINDINGS: The heart size and mediastinal contours are within normal limits. Prior aortic valve replacement and CABG. Chronic interstitial coarsening. No focal consolidation, sizeable pleural effusion, or pneumothorax. No acute osseous abnormality. IMPRESSION: No acute findings in the chest. Electronically Signed   By: Harrietta Sherry M.D.   On: 12/12/2023 14:06   DG Pelvis Portable Result Date: 12/12/2023 CLINICAL DATA:  Level 2 trauma.  Fall. EXAM: PORTABLE PELVIS 1-2 VIEWS COMPARISON:  None Available. FINDINGS: No acute fracture or dislocation. The bones are osteopenic. Moderate bilateral hip arthritic changes. The soft tissues are unremarkable. IMPRESSION: 1. No acute fracture or dislocation. 2. Moderate bilateral hip arthritic changes. Electronically Signed   By: Vanetta Chou M.D.   On: 12/12/2023 14:05     Procedures   Medications Ordered in the ED  Tdap (ADACEL ) injection 0.5 mL (has no administration in time range)  lidocaine -EPINEPHrine  (XYLOCAINE  W/EPI) 2 %-1:200000 (PF) injection 10 mL (has no administration in time range)                                    Medical Decision Making Amount and/or Complexity of Data Reviewed Labs: ordered. Radiology: ordered. ECG/medicine tests: ordered.  Risk Prescription drug management.   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  Patient was leaving a restaurant after having lunch this afternoon.  She was standing with her friend and was leaning against a cart that shifted and she lost balance  falling to the ground striking her forehead against the ground.  She denies any loss of consciousness.  She is currently on Plavix  and therefore patient was brought in as a level 2 trauma.  She endorsed mild headache only but denies any severe neck pain, chest pain, trouble breathing, abdominal pain, back pain, or pain to her extremities.  She denies any precipitating symptoms prior to the fall.  She denies any confusion or nausea or vomiting.  Exam notable for laceration noted to mid forehead not actively bleeding.  No other significant signs of injury noted.  Patient is alert and orient x 4.  Chest abdomen pelvis nontender to palpation  -Labs ordered, and currently pending -The patient was maintained on a cardiac monitor.  I personally viewed and interpreted the cardiac monitored which showed an underlying rhythm of: NSR -Imaging independently viewed and interpreted by me and I agree with radiologist's interpretation.  Result remarkable for CT scan of the head and C-spine without concerning findings.  Left frontal scalp hematoma were noted.  For x-ray of the chest and abdomen are reassuring. -This patient presents to the ED for concern of fall, this involves an extensive number of treatment options, and is a complaint that carries with it a high risk of complications and morbidity.  The differential diagnosis includes mechanical  fall, metabolic derangement, stroke, MI, fracture, dislocation, strain, sprain, contusion, laceration -Co morbidities that complicate the patient evaluation includes diabetes, claudication, bipolar replacement, CAD, diabetes -Treatment includes laceration repair -Reevaluation of the patient after these medicines showed that the patient improved -PCP office notes or outside notes reviewed -Discussion with attending Dr. Ginger.  Pt sign out to oncoming provider who will perform laceration repair and follow up on labs -Escalation to admission/observation considered: dispo pending      Final diagnoses:  Laceration of forehead, initial encounter  Fall from ground level    ED Discharge Orders     None          Nivia Colon, PA-C 12/12/23 1534    Tegeler, Lonni PARAS, MD 12/13/23 1334

## 2023-12-12 NOTE — ED Triage Notes (Incomplete)
 According to [facility]: pt was at restaurant and leaned against a table when it gave way. She fell and hit her head with a 2 inch laceration on her forehead. Negative on neck and back pain. Negative on loss of consciousness. Pt is positive for Plavix .  Ems came in emergent due to bp being 190 systolic palpated. 103 hr, 94 % room air and 117 cbg.

## 2023-12-17 ENCOUNTER — Ambulatory Visit (INDEPENDENT_AMBULATORY_CARE_PROVIDER_SITE_OTHER): Admitting: Nurse Practitioner

## 2023-12-17 ENCOUNTER — Encounter: Payer: Self-pay | Admitting: Nurse Practitioner

## 2023-12-17 VITALS — BP 130/74 | HR 77 | Temp 97.1°F | Ht 63.0 in | Wt 146.8 lb

## 2023-12-17 DIAGNOSIS — Z4802 Encounter for removal of sutures: Secondary | ICD-10-CM

## 2023-12-17 DIAGNOSIS — S0181XA Laceration without foreign body of other part of head, initial encounter: Secondary | ICD-10-CM | POA: Insufficient documentation

## 2023-12-17 DIAGNOSIS — S0181XD Laceration without foreign body of other part of head, subsequent encounter: Secondary | ICD-10-CM

## 2023-12-17 NOTE — Patient Instructions (Signed)
 It was great to see you!  Keep area clean and dry, you can shower   The steri strips will fall off on their own or you can remove them in 3-4 days   Call the office if you develop redness, drainage, or if the area opens   Let's follow-up with any concerns  Take care,  Tinnie Harada, NP

## 2023-12-17 NOTE — Assessment & Plan Note (Signed)
 After obtaining verbal consent, 11 sutures removed. Patient tolerated procedure well, no complications. Healing well without infection or dehiscence. A small scar is expected. Applied Steri-Strips for wound support. She should avoid rubbing the area with soap and water for a few days. Remove Steri-Strips in 3-4 days or allow them to fall off naturally. Monitor for infection signs such as redness, swelling, or drainage. Call if the area opens or shows infection signs. Up to date on tetanus.

## 2023-12-17 NOTE — Progress Notes (Signed)
   Established Patient Office Visit  Subjective   Patient ID: [name], female    DOB: 1940-12-27  Age: 83 y.o. MRN: [id number]  Chief Complaint  Patient presents with   Hospitalization Follow-up    Suture removal from left side of forehead    HPI  Discussed the use of AI scribe software for clinical note transcription with the patient, who gave verbal consent to proceed.  History of Present Illness   [name] is an 83 year old female who presents with a head injury following a fall.  She fell in a restaurant when she leaned on a rolling podium, lost balance, and hit her head. Her sister saw her hit her head twice. She had no dizziness or other symptoms before the fall. She has soreness at the injury site but pain is manageable.   She went to the ER on 12/12/23. A CT scan showed a small hematoma. She received eleven stitches. She applies polysporin and usually leaves the wound uncovered at home.  She denies drainage, redness, and fever.       ROS See pertinent positives and negatives per HPI.    Objective:     BP 130/74 (BP Location: Left Arm, Cuff Size: Large)   Pulse 77   Temp (!) 97.1 F (36.2 C)   Ht 5' 3 (1.6 m)   Wt 146 lb 12.8 oz (66.6 kg)   SpO2 96%   BMI 26.00 kg/m    Physical Exam Vitals and nursing note reviewed.  Constitutional:      General: She is not in acute distress.    Appearance: Normal appearance.  HENT:     Head: Normocephalic.  Eyes:     Conjunctiva/sclera: Conjunctivae normal.  Pulmonary:     Effort: Pulmonary effort is normal.  Musculoskeletal:     Cervical back: Normal range of motion.  Skin:    General: Skin is warm.     Findings: Bruising (to left forehead, under bilateral eyes) present.     Comments: Laceration approximated, healing well, no signs of infection  Neurological:     General: No focal deficit present.     Mental Status: She is alert and oriented to person, place, and time.  Psychiatric:        Mood  and Affect: Mood normal.        Behavior: Behavior normal.        Thought Content: Thought content normal.        Judgment: Judgment normal.      Assessment & Plan:   Problem List Items Addressed This Visit       Other   Laceration of forehead - Primary   After obtaining verbal consent, 11 sutures removed. Patient tolerated procedure well, no complications. Healing well without infection or dehiscence. A small scar is expected. Applied Steri-Strips for wound support. She should avoid rubbing the area with soap and water for a few days. Remove Steri-Strips in 3-4 days or allow them to fall off naturally. Monitor for infection signs such as redness, swelling, or drainage. Call if the area opens or shows infection signs. Up to date on tetanus.         Return if symptoms worsen or fail to improve.    Tinnie DELENA Harada, NP

## 2023-12-22 ENCOUNTER — Other Ambulatory Visit: Payer: Self-pay | Admitting: Cardiovascular Disease

## 2024-01-09 NOTE — Assessment & Plan Note (Signed)
 Stable. Continue focus on blood pressure and glucose control, adequate hydration, and avoidance of nephrotoxic medications. Avoiding SGLT2i due to recurrent UTIs.

## 2024-01-09 NOTE — Assessment & Plan Note (Signed)
 LDL cholesterol at goal. Continue Repatha and Vascepa 1 gm, 2 caps bid.

## 2024-01-09 NOTE — Assessment & Plan Note (Signed)
 [name] has severe PAD. We continue to focus on optimal diabetes, hypertension, and lipid control.

## 2024-01-09 NOTE — Progress Notes (Signed)
 [facility]IMARY [facility]ARE [facility] [address] [place] [address] Dept: [phone] Dept Fax: [phone]  Chronic Care Office Visit  Subjective:    Patient ID: [name], female    DOB: 25-Sep-1940, 83 y.o..   MRN: [id number]  No chief complaint on file.  History of Present Illness:  Patient is in today for reassessment of chronic medical conditions.   Ms. [name] has a history of peripheral artery disease. 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  She now has a prosthesis. She has severe PAD of the right leg, which is being followed by vascular surgery. She had a new stent placed in June 2024.    Ms. Petruska has a past history of aortic stenosis and coronary artery disease. She had CABG x 2 and aortic valve replacement in 04/2020. She also has hypertension and hyperlipidemia.  She is managed on aspirin  81 mg daily, clopidogrel  75 mg daily, metoprolol  tartrate 50 mg bid, and amlodipine  10 mg daily. She has been intolerant of statins and is on Repatha  and Vascepa .   Ms. Cunanan has Type 2 diabetes. She is managed on glipizide  10 mg, semaglutide  (Ozempic ) 0.5 mg weekly, and insulin  glargine (Lantus ) 25 units bid. Her diabetes is complicated with peripheral neuropathy.   Ms. Duty has a history of hypothyroidism. She is managed on levothyroxine  125 mcg daily.    Ms. Silliman has a history of rheumatoid arthritis and is on periodic infusions of Simponi. She also has erosive osteoarthritis of the hands.   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   Sinus tachycardia 03/11/2021   Elevated troponin 03/08/2021   Stroke-like symptoms 03/08/2021   Anemia of chronic disease 03/08/2021   Reactive airway disease  01/20/2021   Diabetic peripheral neuropathy (HCC) 12/19/2020   Primary insomnia 11/02/2020   Irritation of eyelid 07/26/2020   S/P aortic valve replacement with bioprosthetic valve 05/04/2020   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   Peripheral artery disease    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) 04/07/2019   Scoliosis deformity of spine 02/03/2019   Osteoarthritis of hip 11/17/2018   Claudication    Recurrent UTI 07/21/2018   Chronic headaches 11/12/2017   Degeneration of lumbar intervertebral disc 06/13/2017   Stage 3b chronic kidney disease (CKD) (HCC)    Mass of left adrenal gland 04/19/2017   Spinal stenosis of lumbar region 10/20/2014   Hyperlipidemia 03/16/2013   Dense breasts 12/22/2012   Anxiety state 04/16/2012   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/16/2012   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   Osteoarthritis of knee 04/16/2012   History of breast cancer 12/05/2010   Depression 09/14/2008   GERD 09/14/2008   Type 2 diabetes mellitus with neurologic complication, with long-term current use of insulin  (HCC) 05/20/2008   Essential hypertension 04/17/2007   Coronary atherosclerosis 04/17/2007   Hypothyroidism 04/03/2007   Past Surgical History:  Procedure Laterality Date   ABDOMINAL AORTOGRAM W/LOWER EXTREMITY Bilateral 04/29/2019   Procedure: ABDOMINAL AORTOGRAM W/LOWER EXTREMITY;  Surgeon: Gretta Lonni PARAS, MD;  Location: [facility];  Service: Cardiovascular;  Laterality: Bilateral;   ABDOMINAL AORTOGRAM W/LOWER EXTREMITY Left 07/01/2019   Procedure: ABDOMINAL AORTOGRAM W/ Left LOWER EXTREMITY Runoff;  Surgeon: Gretta Lonni  J, MD;  Location: [facility];  Service: Cardiovascular;  Laterality: Left;   ABDOMINAL AORTOGRAM W/LOWER EXTREMITY Left 03/03/2021   Procedure: ABDOMINAL AORTOGRAM W/LOWER EXTREMITY;  Surgeon: Magda Debby SAILOR, MD;  Location: [facility];  Service: Cardiovascular;  Laterality: Left;    ABDOMINAL AORTOGRAM W/LOWER EXTREMITY Right 06/12/2021   Procedure: ABDOMINAL AORTOGRAM W/LOWER EXTREMITY;  Surgeon: Sheree Penne Bruckner, MD;  Location: [facility] INVASIVE CV LAB;  Service: Cardiovascular;  Laterality: Right;  LOWER LEG   ABDOMINAL AORTOGRAM W/LOWER EXTREMITY N/A 03/07/2022   Procedure: ABDOMINAL AORTOGRAM W/LOWER EXTREMITY;  Surgeon: Lanis Fonda BRAVO, MD;  Location: [facility] INVASIVE CV LAB;  Service: Cardiovascular;  Laterality: N/A;   ABDOMINAL AORTOGRAM W/LOWER EXTREMITY N/A 07/18/2022   Procedure: ABDOMINAL AORTOGRAM W/LOWER EXTREMITY;  Surgeon: Lanis Fonda BRAVO, MD;  Location: [facility] INVASIVE CV LAB;  Service: Cardiovascular;  Laterality: N/A;   AMPUTATION Left 03/06/2021   Procedure: LEFT ABOVE KNEE AMPUTATION;  Surgeon: Lanis Fonda BRAVO, MD;  Location: [facility] OR;  Service: Vascular;  Laterality: Left;   ANTERIOR AND POSTERIOR REPAIR N/A 04/05/2016   Procedure: ANTERIOR (CYSTOCELE) AND POSTERIOR REPAIR (RECTOCELE);  Surgeon: Truman Corona, MD;  Location: [facility];  Service: Gynecology;  Laterality: N/A;   AORTIC VALVE REPLACEMENT N/A 05/04/2020   Procedure: AORTIC VALVE REPLACEMENT (AVR) USING INSPIRIS RESILIA AORTIC VALVE;  Surgeon: Dusty Sudie DEL, MD;  Location: [facility] OR;  Service: Open Heart Surgery;  Laterality: N/A;   BREAST BIOPSY     BREAST LUMPECTOMY  1983   benign biopsy   BREAST LUMPECTOMY Right 12/29/2010   snbx, ER/PR +, Her2 -, 0/1 node pos.   CARPAL TUNNEL RELEASE Bilateral 9/12,8,12   CATARACT EXTRACTION Right 2024   CHOLECYSTECTOMY     COLONOSCOPY N/A 02/09/2013   Procedure: COLONOSCOPY;  Surgeon: Princella CHRISTELLA Nida, MD;  Location: [facility];  Service: Endoscopy;  Laterality: N/A;   COLONOSCOPY     CORONARY ANGIOPLASTY     CORONARY ARTERY BYPASS GRAFT N/A 05/04/2020   Procedure: CORONARY ARTERY BYPASS GRAFTING (CABG), ON PUMP, TIMES TWO, USING LEFT INTERNAL MAMMARY ARTERY AND RIGHT ENDOSCOPICALLY HARVESTED GREATER SAPHENOUS VEIN;  Surgeon: Dusty Sudie DEL, MD;   Location: [facility] OR;  Service: Open Heart Surgery;  Laterality: N/A;   DILATION AND CURETTAGE OF UTERUS     ESOPHAGOGASTRODUODENOSCOPY N/A 02/09/2013   Procedure: ESOPHAGOGASTRODUODENOSCOPY (EGD);  Surgeon: Princella CHRISTELLA Nida, MD;  Location: [facility];  Service: Endoscopy;  Laterality: N/A;   FOOT SPURS     HYSTEROSCOPY WITH D & C N/A 09/11/2012   Procedure: DILATATION AND CURETTAGE ;  Surgeon: Truman Corona, MD;  Location: [facility];  Service: Gynecology;  Laterality: N/A;   KNEE SURGERY  2007   LAPAROSCOPIC ASSISTED VAGINAL HYSTERECTOMY N/A 04/05/2016   Procedure: LAPAROSCOPIC ASSISTED VAGINAL HYSTERECTOMY possible BSO;  Surgeon: Truman Corona, MD;  Location: [facility];  Service: Gynecology;  Laterality: N/A;   LUMBAR LAMINECTOMY/DECOMPRESSION MICRODISCECTOMY N/A 10/20/2014   Procedure: MICRO LUMBER DECOMPRESSION L3-4 L4-5;  Surgeon: Reyes Billing, MD;  Location: [facility];  Service: Orthopedics;  Laterality: N/A;   LUMBAR LAMINECTOMY/DECOMPRESSION MICRODISCECTOMY Bilateral 10/13/2015   Procedure: MICRO LUMBAR DECOMPRESSION L5 - S1 AND REDO DECOMPRESSION L4 - L5 AND REMOVAL OF FACET CYST L4 - L5 2 LEVELS;  Surgeon: Reyes Billing, MD;  Location: [facility];  Service: Orthopedics;  Laterality: Bilateral;   PERIPHERAL VASCULAR ATHERECTOMY Right 06/12/2021   Procedure: PERIPHERAL VASCULAR ATHERECTOMY;  Surgeon: Sheree Penne Bruckner, MD;  Location: [facility] INVASIVE CV LAB;  Service: Cardiovascular;  Laterality: Right;  SFA/POP/AT   PERIPHERAL VASCULAR INTERVENTION Right 04/29/2019   Procedure: PERIPHERAL VASCULAR INTERVENTION;  Surgeon: Gretta Lonni PARAS, MD;  Location: [facility];  Service: Cardiovascular;  Laterality: Right;   PERIPHERAL VASCULAR INTERVENTION Right 06/12/2021   Procedure: PERIPHERAL VASCULAR INTERVENTION;  Surgeon: Sheree Penne Lonni, MD;  Location: [facility] INVASIVE CV LAB;  Service: Cardiovascular;  Laterality: Right;  SFP/POPLITEAL/AT   PERIPHERAL VASCULAR INTERVENTION  03/07/2022    Procedure: PERIPHERAL VASCULAR INTERVENTION;  Surgeon: Lanis Fonda BRAVO, MD;  Location: [facility] INVASIVE CV LAB;  Service: Cardiovascular;;   PERIPHERAL VASCULAR INTERVENTION Right 07/18/2022   Procedure: PERIPHERAL VASCULAR INTERVENTION;  Surgeon: Lanis Fonda BRAVO, MD;  Location: [facility] INVASIVE CV LAB;  Service: Cardiovascular;  Laterality: Right;   PORT-A-CATH REMOVAL  04/17/2011   Procedure: REMOVAL PORT-A-CATH;  Surgeon: Donnice Bury, MD;  Location: [facility];  Service: General;  Laterality: Left;   PORTACATH PLACEMENT  02/07/2011   Procedure: INSERTION PORT-A-CATH;  Surgeon: Donnice Bury, MD;  Location: [facility];  Service: General;  Laterality: N/A;   RIGHT/LEFT HEART CATH AND CORONARY ANGIOGRAPHY N/A 03/17/2020   Procedure: RIGHT/LEFT HEART CATH AND CORONARY ANGIOGRAPHY;  Surgeon: Verlin Lonni BIRCH, MD;  Location: [facility];  Service: Cardiovascular;  Laterality: N/A;   TEE WITHOUT CARDIOVERSION N/A 05/04/2020   Procedure: TRANSESOPHAGEAL ECHOCARDIOGRAM (TEE);  Surgeon: Dusty Sudie DEL, MD;  Location[facility] OR;  Service: Open Heart Surgery;  Laterality: N/A;   UPPER GASTROINTESTINAL ENDOSCOPY     Family History  Problem Relation Age of Onset   Kidney disease Mother    Heart disease Mother    Throat cancer Father    Alcoholism Father    Heart attack Father    Lymphoma Brother 49   Heart attack Brother    Diabetes Brother    Hypertension Brother    Cancer Son    Colon cancer Neg Hx    Stroke Neg Hx    Esophageal cancer Neg Hx    Rectal cancer Neg Hx    Stomach cancer Neg Hx    Outpatient Medications Prior to Visit  Medication Sig Dispense Refill   Accu-Chek FastClix Lancets MISC USE TO CHECK BLOOD SUGARS 3 TIMES DAILY 306 each 3   amLODipine  (NORVASC ) 10 MG tablet TAKE 1 TABLET(10 MG) BY MOUTH DAILY 90 tablet 0   aspirin  EC 81 MG tablet Take 81 mg by mouth in the morning.     blood glucose meter kit and supplies Dispense based on patient and  insurance preference. Use up to four times daily as directed. (FOR ICD-10 E10.9, E11.9). 1 each 0   Cholecalciferol 125 MCG (5000 UT) capsule Take 5,000 Units by mouth daily.     clobetasol  ointment (TEMOVATE ) 0.05 % APPLY A THIN LAYER TO THE AFFECTED AREA(S) BY TOPICAL ROUTE 2 TIMES PER DAY     clopidogrel  (PLAVIX ) 75 MG tablet TAKE 1 TABLET(75 MG) BY MOUTH DAILY 30 tablet 11   glipiZIDE  (GLUCOTROL  XL) 10 MG 24 hr tablet TAKE 1 TABLET BY MOUTH DAILY  WITH BREAKFAST 90 tablet 3   glucose blood (ACCU-CHEK GUIDE) test strip TEST ONCE DAILY 100 each 5   Golimumab (SIMPONI ARIA IV) Inject into the vein every 2 (two) months.     Insulin  Pen Needle (CAREFINE PEN NEEDLES) 32G X 6 MM MISC 1 application by Does not apply route 2 (two) times daily. 120 each 1   LANTUS  SOLOSTAR 100 UNIT/ML Solostar Pen ADMINISTER 25 UNITS UNDER THE SKIN  TWICE DAILY 15 mL 3   levothyroxine  (SYNTHROID ) 125 MCG tablet TAKE 1 TABLET BY MOUTH DAILY 90 tablet 1   metoprolol  tartrate (LOPRESSOR ) 50 MG tablet TAKE 1 TABLET BY MOUTH TWICE  DAILY 180 tablet 1   pantoprazole  (PROTONIX ) 40 MG tablet TAKE 1 TABLET BY MOUTH DAILY 90 tablet 3   PARoxetine  (PAXIL ) 20 MG tablet TAKE 1 TABLET BY MOUTH IN THE  MORNING 90 tablet 3   REPATHA  SURECLICK 140 MG/ML SOAJ INJECT 1 PEN INTO THE SKIN EVERY 14 DAYS 2 mL 11   Semaglutide , 1 MG/DOSE, 4 MG/3ML SOPN Inject 1 mg as directed once a week. 9 mL 3   valACYclovir  (VALTREX ) 1000 MG tablet Take 1 tablet (1,000 mg total) by mouth daily as needed (Fever Blisters). 180 tablet 0   No facility-administered medications prior to visit.   Allergies[1]   Objective:   There were no vitals filed for this visit. There is no height or weight on file to calculate BMI.   General: Well developed, well nourished. No acute distress. HEENT: Normocephalic, non-traumatic. PERRL, EOMI. Conjunctiva clear. External ears normal. EAC and TMs normal bilaterally. Nose    clear without congestion or rhinorrhea. Mucous  membranes moist. Oropharynx clear. Good dentition. Neck: Supple. No lymphadenopathy. No thyromegaly. Lungs: Clear to auscultation bilaterally. No wheezing, rales or rhonchi. CV: RRR without murmurs or rubs. Pulses 2+ bilaterally. Abdomen: Soft, non-tender. Bowel sounds positive, normal pitch and frequency. No hepatosplenomegaly. No rebound or guarding. Back: Straight. No CVA tenderness bilaterally. Extremities: Full ROM. No joint swelling or tenderness. No edema noted. Skin: Warm and dry. No rashes. Neuro: CN II-XII intact. Normal sensation and DTR bilaterally. Psych: Alert and oriented. Normal mood and affect.  Health Maintenance Due  Topic Date Due   Zoster Vaccines- Shingrix (2 of 2) 03/29/2011   Mammogram  04/15/2020    Imaging CT HEAD WO CONTRAST Result Date: 12/12/2023 IMPRESSION:  1. Left frontal scalp hematoma.  2. No acute intracranial abnormality.    CT CERVICAL SPINE WO CONTRAST Result Date: 12/12/2023 IMPRESSION:  1. No evidence of acute cervical spine fracture, traumatic subluxation or static signs of instability.  2. Multilevel cervical spondylosis as described.   DG Chest Port 1 View Result Date: 12/12/2023 IMPRESSION: No acute findings in the chest.   DG Pelvis Portable Result Date: 12/12/2023 IMPRESSION:  1. No acute fracture or dislocation.  2. Moderate bilateral hip arthritic changes.   VAS US  LOWER EXTREMITY ARTERIAL DUPLEX Result Date: 12/05/2023 Summary:  Right: Patent proximal to distal SFA stent without significant stenosis. Patent popliteal stent with velocities suggestive of a 1-49% stenosis. Proximal anterior tibial artery with history of stent which is difficult to visualize, demonstrates velocities of 221 cm/s suggesting a 50-99% stenosis.    VAS US  ABI WITH/WO TBI Result Date: 12/05/2023 Summary: Right: Resting right ankle-brachial index indicates moderate right lower extremity arterial disease. Right toe pressure is >60 mmHg which  suggests adequate perfusion for healing.   Lab Results {Labs (Optional):29002}  Lab Results  Component Value Date   HGBA1C 7.6 (H) 10/16/2023   Lipid Panel:  Lab Results  Component Value Date   CHOL 108 01/08/2023   HDL 41.00 01/08/2023   LDLCALC 15 01/08/2023   TRIG 260.0 (H) 01/08/2023   Lab Results  Component Value Date   TSH 0.45 01/08/2023    Assessment & Plan:   Problem List Items Addressed This Visit   None   No follow-ups on file.   Garnette CHRISTELLA Simpler, MD  I,Emily Lagle,acting as a scribe for Garnette CHRISTELLA Simpler, MD.,have documented all relevant documentation on the behalf of Garnette CHRISTELLA Simpler, MD.  I, Garnette CHRISTELLA Simpler, MD, have reviewed all documentation for this visit. The documentation on 01/10/2024 for the exam, diagnosis, procedures, and orders are all accurate and complete.    [1]  Allergies Allergen Reactions   Codeine Nausea And Vomiting and Other (See Comments)    Severe stomach cramps, also Other reaction(s): Unknown   Antihistamines, Diphenhydramine-Type Other (See Comments)    Causes hyperactivity   Diphenhydramine     Other reaction(s): Unknown   Erythromycin Hives, Rash and Other (See Comments)    Allergic due to dental work from 50 years ago. It was used in packing and resulted in rash/hives inside and outside of mouth.   Folic Acid  Other (See Comments)    Other reaction(s): tongue pain   Gabapentin     Urinary incontinence   Lyrica  [Pregabalin ]     Double vision/falls   Methotrexate Other (See Comments)    Other reaction(s): oral ulcers   Oxycodone      Hallucinations/crazy feelings   Rheumate [Fish Oil]     Other reaction(s): hypoglycemia   Statins Other (See Comments)    Joint pains Other reaction(s): Unknown   Sulfa Antibiotics Other (See Comments)    Reaction not recalled   Trulicity  [Dulaglutide ] Itching

## 2024-01-09 NOTE — Assessment & Plan Note (Signed)
 Blood pressure is at goal. Continue amlodipine  10 mg daily and metoprolol  tartrate 50 mg bid.

## 2024-01-09 NOTE — Assessment & Plan Note (Signed)
 TSH at goal. Continue levothyroxine 125 mcg daily.

## 2024-01-10 ENCOUNTER — Ambulatory Visit: Admitting: Family Medicine

## 2024-01-10 ENCOUNTER — Ambulatory Visit: Payer: Self-pay | Admitting: Family Medicine

## 2024-01-10 ENCOUNTER — Encounter: Payer: Self-pay | Admitting: Family Medicine

## 2024-01-10 VITALS — BP 130/68 | HR 80 | Temp 97.5°F | Ht 63.0 in | Wt 150.4 lb

## 2024-01-10 DIAGNOSIS — Z7984 Long term (current) use of oral hypoglycemic drugs: Secondary | ICD-10-CM | POA: Diagnosis not present

## 2024-01-10 DIAGNOSIS — Z7985 Long-term (current) use of injectable non-insulin antidiabetic drugs: Secondary | ICD-10-CM | POA: Diagnosis not present

## 2024-01-10 DIAGNOSIS — I10 Essential (primary) hypertension: Secondary | ICD-10-CM

## 2024-01-10 DIAGNOSIS — E785 Hyperlipidemia, unspecified: Secondary | ICD-10-CM

## 2024-01-10 DIAGNOSIS — E1142 Type 2 diabetes mellitus with diabetic polyneuropathy: Secondary | ICD-10-CM | POA: Diagnosis not present

## 2024-01-10 DIAGNOSIS — E039 Hypothyroidism, unspecified: Secondary | ICD-10-CM

## 2024-01-10 DIAGNOSIS — I739 Peripheral vascular disease, unspecified: Secondary | ICD-10-CM | POA: Diagnosis not present

## 2024-01-10 DIAGNOSIS — Z794 Long term (current) use of insulin: Secondary | ICD-10-CM

## 2024-01-10 DIAGNOSIS — N1832 Chronic kidney disease, stage 3b: Secondary | ICD-10-CM | POA: Diagnosis not present

## 2024-01-10 DIAGNOSIS — E1140 Type 2 diabetes mellitus with diabetic neuropathy, unspecified: Secondary | ICD-10-CM | POA: Diagnosis not present

## 2024-01-10 LAB — HEMOGLOBIN A1C: Hgb A1c MFr Bld: 7.5 % — ABNORMAL HIGH (ref 4.6–6.5)

## 2024-01-10 LAB — GLUCOSE, RANDOM: Glucose, Bld: 191 mg/dL — ABNORMAL HIGH (ref 70–99)

## 2024-01-10 MED ORDER — OZEMPIC (0.25 OR 0.5 MG/DOSE) 2 MG/3ML ~~LOC~~ SOPN: 0.5000 mg | PEN_INJECTOR | SUBCUTANEOUS | 3 refills | Status: AC

## 2024-01-10 MED ORDER — LANTUS SOLOSTAR 100 UNIT/ML ~~LOC~~ SOPN: 28.0000 [IU] | PEN_INJECTOR | Freq: Two times a day (BID) | SUBCUTANEOUS | 3 refills | Status: AC

## 2024-01-10 NOTE — Assessment & Plan Note (Signed)
 Last A1c was increasing. We tried to increase the semaglutide  dose, but she was intolerant of this. I will reduce the semaglutide  (Ozempic ) back to 0.5 mg weekly. Continue glipizide  10 mg daily. I will increase her insulin  glargine to 28 units bid. Although she has proteinuria, I am reluctant to add an SGLT2i in light of her having had recurrent UTIs.

## 2024-01-21 ENCOUNTER — Ambulatory Visit: Admitting: Family Medicine

## 2024-01-27 ENCOUNTER — Ambulatory Visit: Admitting: Internal Medicine

## 2024-01-31 NOTE — Telephone Encounter (Signed)
 Will cal her on Monday. Dm/cma

## 2024-01-31 NOTE — Telephone Encounter (Signed)
 Copied from CRM [id number]. Topic: General - Other >> Jan 31, 2024 11:55 AM Victoria A wrote: Reason for CRM: Patient said she wants CMA to give her a call regarding certain speciality doctors- [phone] please call

## 2024-02-04 ENCOUNTER — Telehealth: Payer: Self-pay | Admitting: Family Medicine

## 2024-02-04 ENCOUNTER — Encounter: Payer: Self-pay | Admitting: Family Medicine

## 2024-02-04 DIAGNOSIS — M19011 Primary osteoarthritis, right shoulder: Secondary | ICD-10-CM

## 2024-02-04 NOTE — Telephone Encounter (Signed)
 Spoke to [facility], was advised that no referral was needed to see the surgeon due to the referral was from within their office.  Dm/cma

## 2024-02-04 NOTE — Telephone Encounter (Signed)
 Copied from CRM #[id number]. Topic: Referral - Status >> Feb 04, 2024  8:01 AM Aleatha BROCKS wrote: Reason for CRM: Patient  would like Nurse Karna to give her a call back regarding  Bonney Glasser referral call back #  [phone]

## 2024-02-04 NOTE — Telephone Encounter (Signed)
 Called patient and she state that she needs a referral to Dr Juliene Bailey with [facility] for her shoulder pain.  Also told that since Dr Bailey is referring her to one of [facility]'s surgeons, Dr Franky Pointer, she will also need a referral for that as well per Medicare.   Please review and advise.  Thanks Dm/cma

## 2024-02-27 ENCOUNTER — Other Ambulatory Visit: Payer: Self-pay | Admitting: Family Medicine

## 2024-02-27 DIAGNOSIS — F3342 Major depressive disorder, recurrent, in full remission: Secondary | ICD-10-CM

## 2024-03-23 ENCOUNTER — Ambulatory Visit: Admitting: Cardiovascular Disease

## 2024-03-26 ENCOUNTER — Ambulatory Visit: Admitting: Family Medicine

## 2024-04-09 ENCOUNTER — Ambulatory Visit: Admitting: Family Medicine

## 2024-06-11 ENCOUNTER — Ambulatory Visit (HOSPITAL_COMMUNITY)

## 2024-06-11 ENCOUNTER — Ambulatory Visit

## 2024-09-14 ENCOUNTER — Ambulatory Visit
